# Patient Record
Sex: Male | Born: 1960
Health system: Southern US, Community
[De-identification: ages and names within clinical notes are randomized; demographics above are authoritative.]

## PROBLEM LIST (undated history)

## (undated) DIAGNOSIS — K644 Residual hemorrhoidal skin tags: Secondary | ICD-10-CM

## (undated) DIAGNOSIS — F4310 Post-traumatic stress disorder, unspecified: Secondary | ICD-10-CM

## (undated) DIAGNOSIS — K222 Esophageal obstruction: Secondary | ICD-10-CM

## (undated) DIAGNOSIS — G40209 Localization-related (focal) (partial) symptomatic epilepsy and epileptic syndromes with complex partial seizures, not intractable, without status epilepticus: Secondary | ICD-10-CM

## (undated) DIAGNOSIS — F419 Anxiety disorder, unspecified: Secondary | ICD-10-CM

## (undated) DIAGNOSIS — H269 Unspecified cataract: Secondary | ICD-10-CM

## (undated) DIAGNOSIS — G4730 Sleep apnea, unspecified: Secondary | ICD-10-CM

## (undated) DIAGNOSIS — K5080 Crohn's disease of both small and large intestine without complications: Secondary | ICD-10-CM

## (undated) DIAGNOSIS — H409 Unspecified glaucoma: Secondary | ICD-10-CM

## (undated) DIAGNOSIS — R112 Nausea with vomiting, unspecified: Secondary | ICD-10-CM

## (undated) DIAGNOSIS — A4902 Methicillin resistant Staphylococcus aureus infection, unspecified site: Secondary | ICD-10-CM

## (undated) DIAGNOSIS — Z87442 Personal history of urinary calculi: Secondary | ICD-10-CM

## (undated) DIAGNOSIS — Z9181 History of falling: Secondary | ICD-10-CM

## (undated) DIAGNOSIS — K219 Gastro-esophageal reflux disease without esophagitis: Secondary | ICD-10-CM

## (undated) DIAGNOSIS — N289 Disorder of kidney and ureter, unspecified: Secondary | ICD-10-CM

## (undated) DIAGNOSIS — I10 Essential (primary) hypertension: Secondary | ICD-10-CM

## (undated) DIAGNOSIS — G43909 Migraine, unspecified, not intractable, without status migrainosus: Secondary | ICD-10-CM

## (undated) DIAGNOSIS — M1990 Unspecified osteoarthritis, unspecified site: Secondary | ICD-10-CM

## (undated) DIAGNOSIS — E785 Hyperlipidemia, unspecified: Secondary | ICD-10-CM

## (undated) DIAGNOSIS — F32A Depression, unspecified: Secondary | ICD-10-CM

## (undated) DIAGNOSIS — Z9889 Other specified postprocedural states: Secondary | ICD-10-CM

## (undated) DIAGNOSIS — A4101 Sepsis due to Methicillin susceptible Staphylococcus aureus: Secondary | ICD-10-CM

## (undated) DIAGNOSIS — C4490 Unspecified malignant neoplasm of skin, unspecified: Secondary | ICD-10-CM

## (undated) DIAGNOSIS — J189 Pneumonia, unspecified organism: Secondary | ICD-10-CM

## (undated) DIAGNOSIS — R569 Unspecified convulsions: Secondary | ICD-10-CM

## (undated) DIAGNOSIS — F329 Major depressive disorder, single episode, unspecified: Secondary | ICD-10-CM

## (undated) DIAGNOSIS — D126 Benign neoplasm of colon, unspecified: Secondary | ICD-10-CM

## (undated) DIAGNOSIS — N281 Cyst of kidney, acquired: Secondary | ICD-10-CM

## (undated) DIAGNOSIS — K7460 Unspecified cirrhosis of liver: Secondary | ICD-10-CM

## (undated) DIAGNOSIS — C801 Malignant (primary) neoplasm, unspecified: Secondary | ICD-10-CM

## (undated) DIAGNOSIS — D649 Anemia, unspecified: Secondary | ICD-10-CM

## (undated) HISTORY — DX: Disorder of kidney and ureter, unspecified: N28.9

## (undated) HISTORY — DX: Hyperlipidemia, unspecified: E78.5

## (undated) HISTORY — DX: History of falling: Z91.81

## (undated) HISTORY — PX: COLONOSCOPY: SHX174

## (undated) HISTORY — DX: Pneumonia, unspecified organism: J18.9

## (undated) HISTORY — PX: RADIOFREQUENCY ABLATION LIVER TUMOR: SHX2293

## (undated) HISTORY — PX: SINUS SURGERY WITH INSTATRAK: SHX5215

## (undated) HISTORY — PX: CATARACT EXTRACTION: SUR2

## (undated) HISTORY — PX: EYE SURGERY: SHX253

## (undated) HISTORY — DX: Unspecified cataract: H26.9

## (undated) HISTORY — PX: SHOULDER SURGERY: SHX246

## (undated) HISTORY — DX: Localization-related (focal) (partial) symptomatic epilepsy and epileptic syndromes with complex partial seizures, not intractable, without status epilepticus: G40.209

## (undated) HISTORY — DX: Methicillin resistant Staphylococcus aureus infection, unspecified site: A49.02

## (undated) HISTORY — DX: Gastro-esophageal reflux disease without esophagitis: K21.9

## (undated) HISTORY — DX: Unspecified glaucoma: H40.9

## (undated) HISTORY — PX: SKIN CANCER EXCISION: SHX779

## (undated) HISTORY — DX: Post-traumatic stress disorder, unspecified: F43.10

## (undated) HISTORY — DX: Esophageal obstruction: K22.2

## (undated) HISTORY — PX: POLYPECTOMY: SHX149

## (undated) HISTORY — DX: Malignant (primary) neoplasm, unspecified: C80.1

## (undated) HISTORY — DX: Cyst of kidney, acquired: N28.1

## (undated) HISTORY — DX: Residual hemorrhoidal skin tags: K64.4

## (undated) HISTORY — PX: UPPER GASTROINTESTINAL ENDOSCOPY: SHX188

## (undated) HISTORY — DX: Unspecified malignant neoplasm of skin, unspecified: C44.90

## (undated) HISTORY — DX: Unspecified convulsions: R56.9

## (undated) HISTORY — DX: Benign neoplasm of colon, unspecified: D12.6

## (undated) HISTORY — DX: Crohn's disease of both small and large intestine without complications: K50.80

## (undated) HISTORY — DX: Sepsis due to methicillin susceptible Staphylococcus aureus: A41.01

## (undated) HISTORY — PX: VASECTOMY: SHX75

---

## 1997-06-24 DIAGNOSIS — K5080 Crohn's disease of both small and large intestine without complications: Secondary | ICD-10-CM

## 1997-06-24 HISTORY — DX: Crohn's disease of both small and large intestine without complications: K50.80

## 1997-08-02 ENCOUNTER — Encounter (INDEPENDENT_AMBULATORY_CARE_PROVIDER_SITE_OTHER): Payer: Self-pay | Admitting: Gastroenterology

## 1999-10-15 ENCOUNTER — Encounter: Payer: Self-pay | Admitting: Gastroenterology

## 1999-10-23 DIAGNOSIS — D126 Benign neoplasm of colon, unspecified: Secondary | ICD-10-CM

## 1999-10-23 HISTORY — DX: Benign neoplasm of colon, unspecified: D12.6

## 1999-10-25 ENCOUNTER — Encounter (INDEPENDENT_AMBULATORY_CARE_PROVIDER_SITE_OTHER): Payer: Self-pay | Admitting: Gastroenterology

## 1999-10-26 ENCOUNTER — Other Ambulatory Visit: Admission: RE | Admit: 1999-10-26 | Discharge: 1999-10-26 | Payer: Self-pay | Admitting: Gastroenterology

## 1999-11-01 ENCOUNTER — Encounter: Payer: Self-pay | Admitting: Gastroenterology

## 1999-11-01 ENCOUNTER — Ambulatory Visit (HOSPITAL_COMMUNITY): Admission: RE | Admit: 1999-11-01 | Discharge: 1999-11-01 | Payer: Self-pay | Admitting: Gastroenterology

## 1999-11-26 ENCOUNTER — Ambulatory Visit (HOSPITAL_COMMUNITY): Admission: RE | Admit: 1999-11-26 | Discharge: 1999-11-26 | Payer: Self-pay | Admitting: Gastroenterology

## 1999-11-26 ENCOUNTER — Encounter: Payer: Self-pay | Admitting: Gastroenterology

## 2000-08-19 ENCOUNTER — Other Ambulatory Visit: Admission: RE | Admit: 2000-08-19 | Discharge: 2000-08-19 | Payer: Self-pay | Admitting: Gastroenterology

## 2000-08-19 ENCOUNTER — Encounter (INDEPENDENT_AMBULATORY_CARE_PROVIDER_SITE_OTHER): Payer: Self-pay

## 2000-08-19 ENCOUNTER — Encounter (INDEPENDENT_AMBULATORY_CARE_PROVIDER_SITE_OTHER): Payer: Self-pay | Admitting: Gastroenterology

## 2000-11-07 ENCOUNTER — Encounter: Admission: RE | Admit: 2000-11-07 | Discharge: 2000-11-07 | Payer: Self-pay | Admitting: Gastroenterology

## 2000-11-07 ENCOUNTER — Encounter: Payer: Self-pay | Admitting: Gastroenterology

## 2000-12-02 ENCOUNTER — Encounter (INDEPENDENT_AMBULATORY_CARE_PROVIDER_SITE_OTHER): Payer: Self-pay | Admitting: Gastroenterology

## 2000-12-02 ENCOUNTER — Other Ambulatory Visit: Admission: RE | Admit: 2000-12-02 | Discharge: 2000-12-02 | Payer: Self-pay | Admitting: Gastroenterology

## 2000-12-02 ENCOUNTER — Encounter (INDEPENDENT_AMBULATORY_CARE_PROVIDER_SITE_OTHER): Payer: Self-pay | Admitting: Specialist

## 2001-03-01 ENCOUNTER — Encounter: Payer: Self-pay | Admitting: Orthopedic Surgery

## 2001-03-01 ENCOUNTER — Ambulatory Visit (HOSPITAL_COMMUNITY): Admission: RE | Admit: 2001-03-01 | Discharge: 2001-03-01 | Payer: Self-pay | Admitting: Orthopedic Surgery

## 2001-05-22 ENCOUNTER — Ambulatory Visit (HOSPITAL_COMMUNITY): Admission: RE | Admit: 2001-05-22 | Discharge: 2001-05-22 | Payer: Self-pay | Admitting: Gastroenterology

## 2001-05-22 ENCOUNTER — Encounter: Payer: Self-pay | Admitting: Gastroenterology

## 2001-06-30 ENCOUNTER — Encounter (INDEPENDENT_AMBULATORY_CARE_PROVIDER_SITE_OTHER): Payer: Self-pay | Admitting: Gastroenterology

## 2001-12-10 ENCOUNTER — Encounter: Payer: Self-pay | Admitting: Internal Medicine

## 2001-12-10 ENCOUNTER — Ambulatory Visit (HOSPITAL_COMMUNITY): Admission: RE | Admit: 2001-12-10 | Discharge: 2001-12-10 | Payer: Self-pay | Admitting: Internal Medicine

## 2002-01-26 ENCOUNTER — Encounter: Payer: Self-pay | Admitting: Internal Medicine

## 2002-01-26 ENCOUNTER — Encounter: Admission: RE | Admit: 2002-01-26 | Discharge: 2002-01-26 | Payer: Self-pay | Admitting: Internal Medicine

## 2002-10-18 ENCOUNTER — Ambulatory Visit (HOSPITAL_COMMUNITY): Admission: RE | Admit: 2002-10-18 | Discharge: 2002-10-18 | Payer: Self-pay | Admitting: Internal Medicine

## 2002-10-18 ENCOUNTER — Encounter: Payer: Self-pay | Admitting: Internal Medicine

## 2003-01-04 ENCOUNTER — Encounter (INDEPENDENT_AMBULATORY_CARE_PROVIDER_SITE_OTHER): Payer: Self-pay | Admitting: Gastroenterology

## 2003-05-23 ENCOUNTER — Encounter: Payer: Self-pay | Admitting: Gastroenterology

## 2004-02-07 ENCOUNTER — Encounter (INDEPENDENT_AMBULATORY_CARE_PROVIDER_SITE_OTHER): Payer: Self-pay | Admitting: Gastroenterology

## 2004-05-24 ENCOUNTER — Ambulatory Visit: Payer: Self-pay | Admitting: Gastroenterology

## 2004-06-28 ENCOUNTER — Ambulatory Visit: Payer: Self-pay | Admitting: Gastroenterology

## 2004-09-03 ENCOUNTER — Ambulatory Visit: Payer: Self-pay | Admitting: Internal Medicine

## 2004-09-12 ENCOUNTER — Ambulatory Visit: Payer: Self-pay

## 2004-10-30 ENCOUNTER — Ambulatory Visit: Payer: Self-pay | Admitting: Internal Medicine

## 2005-01-02 ENCOUNTER — Ambulatory Visit: Payer: Self-pay | Admitting: Gastroenterology

## 2005-01-03 ENCOUNTER — Ambulatory Visit: Payer: Self-pay | Admitting: Internal Medicine

## 2005-01-07 ENCOUNTER — Ambulatory Visit: Payer: Self-pay | Admitting: Gastroenterology

## 2005-01-08 ENCOUNTER — Ambulatory Visit (HOSPITAL_COMMUNITY): Admission: RE | Admit: 2005-01-08 | Discharge: 2005-01-08 | Payer: Self-pay | Admitting: Gastroenterology

## 2005-01-17 ENCOUNTER — Ambulatory Visit (HOSPITAL_COMMUNITY): Admission: RE | Admit: 2005-01-17 | Discharge: 2005-01-17 | Payer: Self-pay | Admitting: Urology

## 2005-03-15 ENCOUNTER — Ambulatory Visit: Payer: Self-pay | Admitting: Gastroenterology

## 2005-03-15 ENCOUNTER — Ambulatory Visit (HOSPITAL_COMMUNITY): Admission: RE | Admit: 2005-03-15 | Discharge: 2005-03-15 | Payer: Self-pay | Admitting: Gastroenterology

## 2006-01-23 ENCOUNTER — Ambulatory Visit: Payer: Self-pay | Admitting: Internal Medicine

## 2006-04-25 ENCOUNTER — Ambulatory Visit: Payer: Self-pay | Admitting: Internal Medicine

## 2006-05-22 ENCOUNTER — Ambulatory Visit: Payer: Self-pay | Admitting: Internal Medicine

## 2006-11-10 ENCOUNTER — Ambulatory Visit: Payer: Self-pay | Admitting: Gastroenterology

## 2006-11-13 ENCOUNTER — Ambulatory Visit (HOSPITAL_COMMUNITY): Admission: RE | Admit: 2006-11-13 | Discharge: 2006-11-13 | Payer: Self-pay | Admitting: Gastroenterology

## 2007-02-20 ENCOUNTER — Telehealth (INDEPENDENT_AMBULATORY_CARE_PROVIDER_SITE_OTHER): Payer: Self-pay | Admitting: *Deleted

## 2007-02-24 ENCOUNTER — Ambulatory Visit: Payer: Self-pay | Admitting: Internal Medicine

## 2007-02-27 ENCOUNTER — Ambulatory Visit: Payer: Self-pay | Admitting: Internal Medicine

## 2007-02-27 ENCOUNTER — Encounter (INDEPENDENT_AMBULATORY_CARE_PROVIDER_SITE_OTHER): Payer: Self-pay | Admitting: *Deleted

## 2007-02-27 DIAGNOSIS — I10 Essential (primary) hypertension: Secondary | ICD-10-CM | POA: Insufficient documentation

## 2007-02-27 DIAGNOSIS — E785 Hyperlipidemia, unspecified: Secondary | ICD-10-CM | POA: Insufficient documentation

## 2007-02-27 LAB — CONVERTED CEMR LAB
AST: 22 units/L (ref 0–37)
Basophils Relative: 0.4 % (ref 0.0–1.0)
Bilirubin, Direct: 0.2 mg/dL (ref 0.0–0.3)
CO2: 28 meq/L (ref 19–32)
Cholesterol, target level: 200 mg/dL
Eosinophils Absolute: 0.2 10*3/uL (ref 0.0–0.6)
Eosinophils Relative: 2.7 % (ref 0.0–5.0)
GFR calc Af Amer: 104 mL/min
GFR calc non Af Amer: 86 mL/min
Glucose, Bld: 102 mg/dL — ABNORMAL HIGH (ref 70–99)
HCT: 47.2 % (ref 39.0–52.0)
HDL goal, serum: 40 mg/dL
Hemoglobin: 16.3 g/dL (ref 13.0–17.0)
Lymphocytes Relative: 23.9 % (ref 12.0–46.0)
MCV: 89.9 fL (ref 78.0–100.0)
Neutro Abs: 4 10*3/uL (ref 1.4–7.7)
Neutrophils Relative %: 62.1 % (ref 43.0–77.0)
Sodium: 143 meq/L (ref 135–145)
Total Protein: 7.3 g/dL (ref 6.0–8.3)
WBC: 6.5 10*3/uL (ref 4.5–10.5)

## 2007-03-11 ENCOUNTER — Ambulatory Visit: Payer: Self-pay | Admitting: Gastroenterology

## 2007-03-11 LAB — CONVERTED CEMR LAB
ALT: 31 units/L (ref 0–53)
Albumin: 3.9 g/dL (ref 3.5–5.2)
Alkaline Phosphatase: 59 units/L (ref 39–117)
CO2: 27 meq/L (ref 19–32)
Calcium: 8.8 mg/dL (ref 8.4–10.5)
Eosinophils Absolute: 0.1 10*3/uL (ref 0.0–0.6)
Eosinophils Relative: 1.5 % (ref 0.0–5.0)
Glucose, Bld: 97 mg/dL (ref 70–99)
Lymphocytes Relative: 18.4 % (ref 12.0–46.0)
MCV: 89.5 fL (ref 78.0–100.0)
Monocytes Relative: 9.3 % (ref 3.0–11.0)
Neutro Abs: 5.6 10*3/uL (ref 1.4–7.7)
Platelets: 313 10*3/uL (ref 150–400)
Potassium: 3.9 meq/L (ref 3.5–5.1)
RBC: 5.36 M/uL (ref 4.22–5.81)
Sodium: 145 meq/L (ref 135–145)
Total Bilirubin: 1.2 mg/dL (ref 0.3–1.2)
Total Protein: 7.2 g/dL (ref 6.0–8.3)
WBC: 7.8 10*3/uL (ref 4.5–10.5)

## 2007-04-15 ENCOUNTER — Ambulatory Visit: Payer: Self-pay | Admitting: Gastroenterology

## 2007-04-15 ENCOUNTER — Encounter: Payer: Self-pay | Admitting: Gastroenterology

## 2007-04-15 ENCOUNTER — Encounter: Payer: Self-pay | Admitting: Internal Medicine

## 2007-04-17 ENCOUNTER — Encounter: Payer: Self-pay | Admitting: Internal Medicine

## 2007-04-21 ENCOUNTER — Telehealth (INDEPENDENT_AMBULATORY_CARE_PROVIDER_SITE_OTHER): Payer: Self-pay | Admitting: *Deleted

## 2007-06-02 ENCOUNTER — Encounter: Payer: Self-pay | Admitting: Internal Medicine

## 2007-06-08 ENCOUNTER — Ambulatory Visit: Payer: Self-pay | Admitting: Internal Medicine

## 2007-06-09 ENCOUNTER — Ambulatory Visit: Payer: Self-pay | Admitting: Internal Medicine

## 2007-06-12 ENCOUNTER — Encounter (INDEPENDENT_AMBULATORY_CARE_PROVIDER_SITE_OTHER): Payer: Self-pay | Admitting: *Deleted

## 2007-09-11 ENCOUNTER — Emergency Department (HOSPITAL_COMMUNITY): Admission: EM | Admit: 2007-09-11 | Discharge: 2007-09-11 | Payer: Self-pay | Admitting: Emergency Medicine

## 2007-09-11 DIAGNOSIS — K5080 Crohn's disease of both small and large intestine without complications: Secondary | ICD-10-CM | POA: Insufficient documentation

## 2007-09-11 DIAGNOSIS — K644 Residual hemorrhoidal skin tags: Secondary | ICD-10-CM | POA: Insufficient documentation

## 2007-09-11 DIAGNOSIS — M810 Age-related osteoporosis without current pathological fracture: Secondary | ICD-10-CM | POA: Insufficient documentation

## 2007-11-05 ENCOUNTER — Encounter: Payer: Self-pay | Admitting: Internal Medicine

## 2007-11-18 ENCOUNTER — Telehealth: Payer: Self-pay | Admitting: Gastroenterology

## 2007-12-07 DIAGNOSIS — K219 Gastro-esophageal reflux disease without esophagitis: Secondary | ICD-10-CM | POA: Insufficient documentation

## 2007-12-07 DIAGNOSIS — Z860100 Personal history of colon polyps, unspecified: Secondary | ICD-10-CM | POA: Insufficient documentation

## 2007-12-08 ENCOUNTER — Ambulatory Visit: Payer: Self-pay | Admitting: Gastroenterology

## 2007-12-08 LAB — CONVERTED CEMR LAB
ALT: 46 units/L (ref 0–53)
Alkaline Phosphatase: 58 units/L (ref 39–117)
Basophils Absolute: 0 10*3/uL (ref 0.0–0.1)
Basophils Relative: 0.6 % (ref 0.0–1.0)
CO2: 28 meq/L (ref 19–32)
Creatinine, Ser: 1.5 mg/dL (ref 0.4–1.5)
GFR calc Af Amer: 65 mL/min
GFR calc non Af Amer: 54 mL/min
HCT: 48.6 % (ref 39.0–52.0)
Hemoglobin: 16.6 g/dL (ref 13.0–17.0)
Lipase: 25 units/L (ref 11.0–59.0)
Lymphocytes Relative: 22.3 % (ref 12.0–46.0)
MCHC: 34.2 g/dL (ref 30.0–36.0)
MCV: 90.1 fL (ref 78.0–100.0)
Monocytes Absolute: 1 10*3/uL (ref 0.1–1.0)
Neutro Abs: 4.6 10*3/uL (ref 1.4–7.7)
RBC: 5.39 M/uL (ref 4.22–5.81)
RDW: 12.9 % (ref 11.5–14.6)
Sodium: 142 meq/L (ref 135–145)
Total Bilirubin: 1 mg/dL (ref 0.3–1.2)

## 2008-04-01 ENCOUNTER — Ambulatory Visit: Payer: Self-pay | Admitting: Internal Medicine

## 2008-04-01 ENCOUNTER — Encounter: Payer: Self-pay | Admitting: Internal Medicine

## 2008-04-11 ENCOUNTER — Encounter (INDEPENDENT_AMBULATORY_CARE_PROVIDER_SITE_OTHER): Payer: Self-pay | Admitting: *Deleted

## 2008-04-15 ENCOUNTER — Telehealth (INDEPENDENT_AMBULATORY_CARE_PROVIDER_SITE_OTHER): Payer: Self-pay | Admitting: *Deleted

## 2008-05-13 ENCOUNTER — Telehealth (INDEPENDENT_AMBULATORY_CARE_PROVIDER_SITE_OTHER): Payer: Self-pay | Admitting: *Deleted

## 2008-05-30 ENCOUNTER — Telehealth (INDEPENDENT_AMBULATORY_CARE_PROVIDER_SITE_OTHER): Payer: Self-pay | Admitting: *Deleted

## 2008-07-20 ENCOUNTER — Encounter: Payer: Self-pay | Admitting: Gastroenterology

## 2008-09-07 ENCOUNTER — Encounter: Payer: Self-pay | Admitting: Adult Health

## 2008-10-28 ENCOUNTER — Encounter: Payer: Self-pay | Admitting: Adult Health

## 2008-11-10 ENCOUNTER — Ambulatory Visit: Payer: Self-pay | Admitting: Pulmonary Disease

## 2008-11-10 DIAGNOSIS — R059 Cough, unspecified: Secondary | ICD-10-CM | POA: Insufficient documentation

## 2008-11-10 DIAGNOSIS — R05 Cough: Secondary | ICD-10-CM | POA: Insufficient documentation

## 2008-11-14 ENCOUNTER — Ambulatory Visit: Payer: Self-pay | Admitting: Cardiovascular Disease

## 2008-11-18 ENCOUNTER — Telehealth (INDEPENDENT_AMBULATORY_CARE_PROVIDER_SITE_OTHER): Payer: Self-pay | Admitting: *Deleted

## 2008-11-30 ENCOUNTER — Ambulatory Visit: Payer: Self-pay | Admitting: Pulmonary Disease

## 2008-12-21 ENCOUNTER — Telehealth: Payer: Self-pay | Admitting: Gastroenterology

## 2008-12-21 ENCOUNTER — Ambulatory Visit: Payer: Self-pay | Admitting: Pulmonary Disease

## 2009-01-09 ENCOUNTER — Ambulatory Visit: Payer: Self-pay | Admitting: Gastroenterology

## 2009-01-10 ENCOUNTER — Telehealth: Payer: Self-pay | Admitting: Gastroenterology

## 2009-01-10 ENCOUNTER — Ambulatory Visit: Payer: Self-pay | Admitting: Gastroenterology

## 2009-01-13 ENCOUNTER — Encounter (INDEPENDENT_AMBULATORY_CARE_PROVIDER_SITE_OTHER): Payer: Self-pay

## 2009-01-15 LAB — CONVERTED CEMR LAB
ALT: 76 units/L — ABNORMAL HIGH (ref 0–53)
Albumin: 3.7 g/dL (ref 3.5–5.2)
Alkaline Phosphatase: 69 units/L (ref 39–117)
Basophils Absolute: 0.1 10*3/uL (ref 0.0–0.1)
CO2: 26 meq/L (ref 19–32)
Eosinophils Absolute: 0.1 10*3/uL (ref 0.0–0.7)
HCT: 43.6 % (ref 39.0–52.0)
Lymphs Abs: 1.3 10*3/uL (ref 0.7–4.0)
MCV: 91.2 fL (ref 78.0–100.0)
Monocytes Absolute: 0.7 10*3/uL (ref 0.1–1.0)
Platelets: 270 10*3/uL (ref 150.0–400.0)
Potassium: 3.7 meq/L (ref 3.5–5.1)
RDW: 14.4 % (ref 11.5–14.6)
Sed Rate: 10 mm/hr (ref 0–22)
Sodium: 143 meq/L (ref 135–145)
Total Bilirubin: 1.1 mg/dL (ref 0.3–1.2)
Total Protein: 7.1 g/dL (ref 6.0–8.3)

## 2009-01-18 ENCOUNTER — Telehealth (INDEPENDENT_AMBULATORY_CARE_PROVIDER_SITE_OTHER): Payer: Self-pay | Admitting: *Deleted

## 2009-03-02 ENCOUNTER — Ambulatory Visit: Payer: Self-pay | Admitting: Pulmonary Disease

## 2009-03-10 ENCOUNTER — Telehealth: Payer: Self-pay | Admitting: Gastroenterology

## 2009-03-13 ENCOUNTER — Telehealth (INDEPENDENT_AMBULATORY_CARE_PROVIDER_SITE_OTHER): Payer: Self-pay | Admitting: *Deleted

## 2009-03-16 ENCOUNTER — Ambulatory Visit: Payer: Self-pay | Admitting: Gastroenterology

## 2009-03-27 ENCOUNTER — Ambulatory Visit: Payer: Self-pay | Admitting: Gastroenterology

## 2009-03-27 DIAGNOSIS — R740 Nonspecific elevation of levels of transaminase and lactic acid dehydrogenase [LDH]: Secondary | ICD-10-CM

## 2009-03-27 DIAGNOSIS — R1319 Other dysphagia: Secondary | ICD-10-CM | POA: Insufficient documentation

## 2009-03-29 ENCOUNTER — Telehealth: Payer: Self-pay | Admitting: Gastroenterology

## 2009-04-12 ENCOUNTER — Encounter: Payer: Self-pay | Admitting: Gastroenterology

## 2009-04-12 ENCOUNTER — Ambulatory Visit: Payer: Self-pay | Admitting: Gastroenterology

## 2009-04-14 ENCOUNTER — Encounter: Payer: Self-pay | Admitting: Gastroenterology

## 2009-05-22 LAB — CONVERTED CEMR LAB
ALT: 57 units/L — ABNORMAL HIGH (ref 0–53)
Albumin: 3.6 g/dL (ref 3.5–5.2)
Total Protein: 7 g/dL (ref 6.0–8.3)

## 2009-05-23 ENCOUNTER — Ambulatory Visit: Payer: Self-pay | Admitting: Gastroenterology

## 2009-06-01 ENCOUNTER — Encounter: Admission: RE | Admit: 2009-06-01 | Discharge: 2009-06-01 | Payer: Self-pay | Admitting: Family Medicine

## 2009-08-28 ENCOUNTER — Ambulatory Visit: Payer: Self-pay | Admitting: Gastroenterology

## 2009-08-28 LAB — CONVERTED CEMR LAB
Bilirubin, Direct: 0.1 mg/dL (ref 0.0–0.3)
Total Bilirubin: 1.1 mg/dL (ref 0.3–1.2)
Total Protein: 6.8 g/dL (ref 6.0–8.3)

## 2009-08-29 LAB — CONVERTED CEMR LAB
ALT: 50 units/L (ref 0–53)
Alkaline Phosphatase: 64 units/L (ref 39–117)
Bilirubin, Direct: 0.2 mg/dL (ref 0.0–0.3)
Total Protein: 6.7 g/dL (ref 6.0–8.3)

## 2009-09-04 ENCOUNTER — Encounter (INDEPENDENT_AMBULATORY_CARE_PROVIDER_SITE_OTHER): Payer: Self-pay | Admitting: *Deleted

## 2009-12-11 ENCOUNTER — Ambulatory Visit: Payer: Self-pay | Admitting: Gastroenterology

## 2009-12-11 ENCOUNTER — Encounter (INDEPENDENT_AMBULATORY_CARE_PROVIDER_SITE_OTHER): Payer: Self-pay | Admitting: *Deleted

## 2009-12-12 LAB — CONVERTED CEMR LAB
Amylase: 52 units/L (ref 27–131)
BUN: 14 mg/dL (ref 6–23)
Bilirubin, Direct: 0.1 mg/dL (ref 0.0–0.3)
CRP, High Sensitivity: 4.12 (ref 0.00–5.00)
Chloride: 106 meq/L (ref 96–112)
Creatinine, Ser: 1.2 mg/dL (ref 0.4–1.5)
Eosinophils Absolute: 0 10*3/uL (ref 0.0–0.7)
Eosinophils Relative: 0.4 % (ref 0.0–5.0)
Lipase: 26 units/L (ref 11.0–59.0)
MCHC: 34.3 g/dL (ref 30.0–36.0)
MCV: 95.1 fL (ref 78.0–100.0)
Monocytes Absolute: 1.1 10*3/uL — ABNORMAL HIGH (ref 0.1–1.0)
Neutrophils Relative %: 69.6 % (ref 43.0–77.0)
Platelets: 331 10*3/uL (ref 150.0–400.0)
Total Bilirubin: 1 mg/dL (ref 0.3–1.2)
WBC: 12.1 10*3/uL — ABNORMAL HIGH (ref 4.5–10.5)

## 2009-12-13 ENCOUNTER — Ambulatory Visit: Payer: Self-pay | Admitting: Gastroenterology

## 2009-12-14 LAB — CONVERTED CEMR LAB
Glucose, Bld: 58 mg/dL — ABNORMAL LOW (ref 70–99)
Hgb A1c MFr Bld: 6.6 % — ABNORMAL HIGH (ref 4.6–6.5)

## 2010-01-26 ENCOUNTER — Ambulatory Visit: Payer: Self-pay | Admitting: Gastroenterology

## 2010-01-31 ENCOUNTER — Encounter: Payer: Self-pay | Admitting: Gastroenterology

## 2010-02-15 ENCOUNTER — Encounter: Payer: Self-pay | Admitting: Gastroenterology

## 2010-03-01 ENCOUNTER — Encounter: Payer: Self-pay | Admitting: Gastroenterology

## 2010-03-27 ENCOUNTER — Encounter: Payer: Self-pay | Admitting: Gastroenterology

## 2010-06-05 ENCOUNTER — Encounter: Payer: Self-pay | Admitting: Gastroenterology

## 2010-06-24 HISTORY — PX: ELBOW SURGERY: SHX618

## 2010-07-12 ENCOUNTER — Emergency Department (HOSPITAL_COMMUNITY)
Admission: EM | Admit: 2010-07-12 | Discharge: 2010-07-12 | Payer: Self-pay | Source: Home / Self Care | Admitting: Family Medicine

## 2010-07-12 ENCOUNTER — Ambulatory Visit (HOSPITAL_COMMUNITY)
Admission: RE | Admit: 2010-07-12 | Discharge: 2010-07-12 | Payer: Self-pay | Source: Home / Self Care | Admitting: Emergency Medicine

## 2010-07-23 ENCOUNTER — Ambulatory Visit: Admission: RE | Admit: 2010-07-23 | Discharge: 2010-07-23 | Payer: Self-pay | Source: Home / Self Care

## 2010-07-23 DIAGNOSIS — R7309 Other abnormal glucose: Secondary | ICD-10-CM | POA: Insufficient documentation

## 2010-07-24 ENCOUNTER — Ambulatory Visit: Admit: 2010-07-24 | Payer: Self-pay | Admitting: Gastroenterology

## 2010-07-24 NOTE — Letter (Signed)
Summary: [facility]   Imported By: Phillis Knack 03/19/2010 10:20:49  _____________________________________________________________________  External Attachment:    Type:   Image     Comment:   External Document

## 2010-07-24 NOTE — Letter (Signed)
Summary: Patient Notice- Polyp Results  [facility]  [address] [place], [place] [address]   Phone: [phone]  Fax: [phone]        January 31, 2010 MRN: [id number]    TAMARION HAYMOND [place],   [address]    Dear Mr. PLOCH,  I am pleased to inform you that the colon polyp(s) removed during your recent colonoscopy was (were) found to be benign (no cancer detected) upon pathologic examination.  The colonic biopies were normal; colitis was not noted.  I recommend you have a repeat colonoscopy examination in 3 years to look for recurrent polyps, as having colon polyps increases your risk for having recurrent polyps or even colon cancer in the future.  Should you develop new or worsening symptoms of abdominal pain, bowel habit changes or bleeding from the rectum or bowels, please schedule an evaluation with either your primary care physician or with me.  Continue treatment plan as outlined the day of your exam.  Please call us if you are having persistent problems or have questions about your condition that have not been fully answered at this time.  Sincerely,  Ladene Artist MD [facility]  This letter has been electronically signed by your physician.  Appended Document: Patient Notice- Polyp Results letter mailed

## 2010-07-24 NOTE — Letter (Signed)
Summary: Office Visit Letter  [facility]  [address] [place], [place] [address]   Phone: [phone]  Fax: [phone]      September 04, 2009 MRN: [id number]   [name] [name], [place]  [address]   Dear [name],   According to our records, it is time for you to schedule a follow-up office visit with [place].   At your convenience, please call [phone] (option #2)to schedule an office visit. If you have any questions, concerns, or feel that this letter is in error, we would appreciate your call.   Sincerely,  Norberto Sorenson T. Fuller Plan, M.D.  [facility] [phone]

## 2010-07-24 NOTE — Letter (Signed)
Summary: [facility] Instructions  [facility]  [place], [place] [address]   Phone: [phone]  Fax: [phone]       [name] [name]    1960-12-19    MRN: [id number]        Procedure Day /Date: 01/26/2010 Friday     Arrival Time: 12:30pm     Procedure Time: 1:30pm     Location of Procedure:                    X   [place] (4th Floor)                        [place]   Starting 5 days prior to your procedure 01/21/2010 do not eat nuts, seeds, popcorn, corn, beans, peas,  salads, or any raw vegetables.  Do not take any fiber supplements (e.g. Metamucil, Citrucel, and Benefiber).  THE DAY BEFORE YOUR PROCEDURE         DATE: 01/25/2010  DAY: Thursday  1.  Drink clear liquids the entire day-NO SOLID FOOD  2.  Do not drink anything colored red or purple.  Avoid juices with pulp.  No orange juice.  3.  Drink at least 64 oz. (8 glasses) of fluid/clear liquids during the day to prevent dehydration and help the prep work efficiently.  CLEAR LIQUIDS INCLUDE: Water Jello Ice Popsicles Tea (sugar ok, no milk/cream) Powdered fruit flavored drinks Coffee (sugar ok, no milk/cream) Gatorade Juice: apple, white grape, white cranberry  Lemonade Clear bullion, consomm, broth Carbonated beverages (any kind) Strained chicken noodle soup Hard Candy                             4.  In the morning, mix first dose of MoviPrep solution:    Empty 1 Pouch A and 1 Pouch B into the disposable container    Add lukewarm drinking water to the top line of the container. Mix to dissolve    Refrigerate (mixed solution should be used within 24 hrs)  5.  Begin drinking the prep at 5:00 p.m. The MoviPrep container is divided by 4 marks.   Every 15 minutes drink the solution down to the next mark (approximately 8 oz) until the full liter is complete.   6.  Follow completed prep with 16 oz of clear liquid of your choice (Nothing  red or purple).  Continue to drink clear liquids until bedtime.  7.  Before going to bed, mix second dose of MoviPrep solution:    Empty 1 Pouch A and 1 Pouch B into the disposable container    Add lukewarm drinking water to the top line of the container. Mix to dissolve    Refrigerate  THE DAY OF YOUR PROCEDURE      DATE: 01/26/2010 DAY: Friday  Beginning at 8:30am (5 hours before procedure):         1. Every 15 minutes, drink the solution down to the next mark (approx 8 oz) until the full liter is complete.  2. Follow completed prep with 16 oz. of clear liquid of your choice.    3. You may drink clear liquids until 11:30am(2 HOURS BEFORE PROCEDURE).   MEDICATION INSTRUCTIONS  Unless otherwise instructed, you should take regular prescription medications with a small sip of water   as early as possible the morning of your procedure.  OTHER INSTRUCTIONS  You will need a responsible adult at least 49 years of age to accompany you and drive you home.   This person must remain in the waiting room during your procedure.  Wear loose fitting clothing that is easily removed.  Leave jewelry and other valuables at home.  However, you may wish to bring a book to read or  an iPod/MP3 player to listen to music as you wait for your procedure to start.  Remove all body piercing jewelry and leave at home.  Total time from sign-in until discharge is approximately 2-3 hours.  You should go home directly after your procedure and rest.  You can resume normal activities the  day after your procedure.  The day of your procedure you should not:   Drive   Make legal decisions   Operate machinery   Drink alcohol   Return to work  You will receive specific instructions about eating, activities and medications before you leave.    The above instructions have been reviewed and explained to me by   _______________________    I fully understand and can verbalize these  instructions _____________________________ Date _________

## 2010-07-24 NOTE — Letter (Signed)
Summary: [facility]   Imported By: Phillis Knack 04/04/2010 10:46:25  _____________________________________________________________________  External Attachment:    Type:   Image     Comment:   External Document

## 2010-07-24 NOTE — Procedures (Signed)
Summary: Colonoscopy  Patient: [name] [name] Note: All result statuses are Final unless otherwise noted.  Tests: (1) Colonoscopy (COL)   COL Colonoscopy           DONE (C)     [facility] [employer] [address]     [place], [place]  [address]           COLONOSCOPY PROCEDURE REPORT           PATIENT:  [name], [name]  MR#:  [id number]     BIRTHDATE:  [birth year], 49 yrs. old  GENDER:  male     ENDOSCOPIST:  Norberto Sorenson T. Fuller Plan, MD, [facility]           PROCEDURE DATE:  01/26/2010     PROCEDURE:  Colonoscopy with biopsy and snare polypectomy     ASA CLASS:  Class II     INDICATIONS:  1) surveillance and high-risk screening  2) Crohn's     disease  3) follow-up of polyp, adenomatous polyp, 10/1999.     MEDICATIONS:   Fentanyl 50 mcg IV, Versed 7 mg IV     DESCRIPTION OF PROCEDURE:   After the risks benefits and     alternatives of the procedure were thoroughly explained, informed     consent was obtained.  Digital rectal exam was performed and     revealed no abnormalities.   The LB [id number]-[id number] endoscope     was introduced through the anus and advanced to the terminal ileum     which was intubated for a short distance, without limitations.     The quality of the prep was good, using MoviPrep.  The instrument     was then slowly withdrawn as the colon was fully examined. Photo     of appendiceal orifice did not capture.     <<PROCEDUREIMAGES>>     FINDINGS:  A sessile polyp was found in the distal transverse     colon. It was 4 mm in size. The polyp was removed using cold     biopsy forceps.  A sessile polyp was found in the sigmoid colon.     It was 6 mm in size. Polyp was snared without cautery. Retrieval     was successful.  A normal appearing cecum, ileocecal valve, and     appendiceal orifice were identified. The ascending, hepatic     flexure, splenic flexure, descending colon, and rectum appeared     unremarkable. Random biopsies were obtained and sent to pathology.     The  terminal ileum appeared normal. Retroflexed views in the     rectum revealed internal hemorrhoids, small.  The time to cecum =     1.75  minutes. The scope was then withdrawn (time =  9.75  min)     from the patient and the procedure completed.           COMPLICATIONS:  None           ENDOSCOPIC IMPRESSION:     1) 4 mm sessile polyp in the distal transverse colon     2) 6 mm sessile polyp in the sigmoid colon     3) Normal terminal ileum     4) Internal hemorrhoids           RECOMMENDATIONS:     1) Await pathology results     2) Repeat Colonoscopy in 3 years pending pathology review  Pricilla Riffle. Fuller Plan, MD, [facility]           CC: [name], MD           n.     REVISED:  01/26/2010 02:24 PM     eSIGNED:   Norberto Sorenson T. Stark at 01/26/2010 02:24 PM           [name] [name], [id number]  Note: An exclamation mark (!) indicates a result that was not dispersed into the flowsheet. Document Creation Date: 01/26/2010 2:25 PM _______________________________________________________________________  (1) Order result status: Final Collection or observation date-time: 01/26/2010 13:47 Requested date-time:  Receipt date-time:  Reported date-time:  Referring Physician:   Ordering Physician: Lucio Edward [phone]) Specimen Source:  Source: Tawanna Cooler Order Number: [id number] Lab site:   Appended Document: Colonoscopy     Procedures Next Due Date:    Colonoscopy: 01/2013

## 2010-07-24 NOTE — Assessment & Plan Note (Signed)
Summary: f/u--ch.   History of Present Illness Visit Type: Follow-up Visit Primary GI MD: [name] Igo MD [facility] Primary Provider: [name], MD Requesting Provider: n/a Chief Complaint: Here for Crohns follow up. Patient complains of Friday he had a flare which lasted all weekend with abdominal pain and diarrhea.  History of Present Illness:   [name] for followup today, noting several days of right lower quadrant pain, diarrhea, and malaise. He feels that his symptoms are typical for a mild flare of his Crohns. He relates no fevers, chills, nausea or vomiting, or severe pain.   GI Review of Systems    Reports abdominal pain.     Location of  Abdominal pain: RLQ.    Denies acid reflux, belching, bloating, chest pain, dysphagia with liquids, dysphagia with solids, heartburn, loss of appetite, nausea, vomiting, vomiting blood, weight loss, and  weight gain.      Reports diarrhea.     Denies anal fissure, black tarry stools, change in bowel habit, constipation, diverticulosis, fecal incontinence, heme positive stool, hemorrhoids, irritable bowel syndrome, jaundice, light color stool, liver problems, rectal bleeding, and  rectal pain.   Current Medications (verified): 1)  Diltiazem Hcl Cr 240 Mg  Cp24 (Diltiazem Hcl) .Marland Kitchen.. 1 By Mouth Qd 2)  Nexium 40 Mg Cpdr (Esomeprazole Magnesium) .... Take 1 Capsule By Mouth Once A Day 3)  Asacol Hd 800 Mg Tbec (Mesalamine) .... 2 Tablets By Mouth Three Times A Day 4)  Folic Acid 1 Mg  Tabs (Folic Acid) .Marland Kitchen.. 1 By Mouth Qd 5)  Pamine Forte 5 Mg  Tabs (Methscopolamine Bromide) .Marland Kitchen.. 1 By Mouth Bid 6)  Mercaptopurine 50 Mg  Tabs (Mercaptopurine) .... Two Tablets By Mouth Once Daily 7)  Gnp Dairy-Relief 3000 Unit  Tabs (Lactase) .... 3 Tabs Tid 8)  Calcium-Vitamin D 500-125 Mg-Unit Tabs (Calcium-Vitamin D) .... One Tablet By Mouth Three Times A Day 9)  Diovan 160 Mg Tabs (Valsartan) .Marland Kitchen.. 1 By Mouth Once Daily 10)  Florastor 250 Mg Caps  (Saccharomyces Boulardii) .... One Capsule By Mouth Once Daily 11)  Entocort Ec 3 Mg Xr24h-Cap (Budesonide) .... 3 Capsules By Mouth Every Morning  Allergies (verified): 1)  ! * Sulfa Based Antibiotics 2)  ! Pcn  Past History:  Past Medical History: GERD (ICD-530.81) COLONIC POLYPS, ADENOMATOUS, HX OF (ICD-V12.72), 10/1999 OSTEOPOROSIS (ICD-733.00) EXTERNAL HEMORRHOIDS (ICD-455.3) CROHN'S DISEASE, LARGE AND SMALL INTESTINES (ICD-555.2), 1999 HYPERLIPIDEMIA NEC/NOS (ICD-272.4) HYPERTENSION, ESSENTIAL NOS (ICD-401.9) Pneumonia x 3 Esophageal strictures  Past Surgical History: Reviewed history from 03/27/2009 and no changes required. S/P Right shoulder surgery Sinus Surgery   Family History: Reviewed history from 12/08/2007 and no changes required. No FH of Colon Cancer: Family History of Colon Polyps:Father Family History of Heart Disease:Mother and Father   Social History: Occupation: Financial controller Patient has never smoked.  Alcohol Use - no Daily Caffeine Use 1 coke a day Illicit Drug Use - no Patient does not get regular exercise.   Review of Systems       The patient complains of allergy/sinus, arthritis/joint pain, back pain, blood in urine, fatigue, and muscle pains/cramps.         The pertinent positives and negatives are noted as above and in the HPI. All other ROS were reviewed and were negative.   Vital Signs:  Patient profile:   50 year old male Height:      71 inches Weight:      266.8 pounds BMI:     37.35  Pulse rate:   80 / minute Pulse rhythm:   regular BP sitting:   132 / 82  (left arm) Cuff size:   regular  Vitals Entered By: Bernita Buffy CMA Deborra Medina) (December 11, 2009 3:29 PM)  Physical Exam  General:  Well developed, well nourished, no acute distress. Head:  Normocephalic and atraumatic. Eyes:  PERRLA, no icterus. Ears:  Normal auditory acuity. Mouth:  No deformity or lesions, dentition normal. Neck:  Supple; no masses or  thyromegaly. Lungs:  Clear throughout to auscultation. Heart:  Regular rate and rhythm; no murmurs, rubs,  or bruits. Abdomen:  Soft and nondistended. No masses, hepatosplenomegaly or hernias noted. Normal bowel sounds. Mild right lower quadrant and periumbilical tenderness to deep palpation without rebound or guarding. Msk:  Symmetrical with no gross deformities. Normal posture. Pulses:  Normal pulses noted. Extremities:  No clubbing, cyanosis, edema or deformities noted. Neurologic:  Alert and  oriented x4;  grossly normal neurologically. Psych:  Alert and cooperative. Normal mood and affect.  Impression & Recommendations:  Problem # 1:  CROHN'S DISEASE, LARGE AND SMALL INTESTINES (ICD-555.2) He appears to be having a mild flare of Crohn's disease on 6 mercaptopurine, Entocort and Asacol. Blood work as below. Continue current medications. If his symptoms fail to resolve within the next few days, or if they worsen, he is advise to call for further evaluation which will likely include an abdominal/pelvic CT scan.  He is due for surveillance colonoscopy for Crohn's colitis and a personal history of colon polyps in a few months and would like to schedule the procedure at this time. The risks, benefits and alternatives to colonoscopy with possible biopsy and possible polypectomy were discussed with the patient and they consent to proceed. The procedure will be scheduled electively. Orders: TLB-BMP (Basic Metabolic Panel-BMET) (35465-KCLEXNT) TLB-CBC Platelet - w/Differential (85025-CBCD) TLB-Hepatic/Liver Function Pnl (80076-HEPATIC) TLB-TSH (Thyroid Stimulating Hormone) (84443-TSH) TLB-CRP-High Sensitivity (C-Reactive Protein) (86140-FCRP) TLB-Amylase (82150-AMYL) TLB-Lipase (83690-LIPASE) Colonoscopy (Colon)  Problem # 2:  COLONIC POLYPS, ADENOMATOUS, HX OF (ICD-V12.72) Colonoscopy followup as above.  Problem # 3:  GERD (ICD-530.81) GERD with a history of esophageal strictures. Continue  standard antireflux measures and Nexium 40 mg q.a.m.  Patient Instructions: 1)  Get your labs drawn today in the basement.  2)  Your prescriptions have been sent to your mail order.  3)  Colonoscopy brochure given.  4)  Copy sent to : [name] Haber, MD 5)  The medication list was reviewed and reconciled.  All changed / newly prescribed medications were explained.  A complete medication list was provided to the patient / caregiver.  Prescriptions: MOVIPREP 100 GM  SOLR (PEG-KCL-NACL-NASULF-NA ASC-C) As per prep instructions.  #1 x 0   Entered by:   Marlon Pel CMA ([place])   Authorized by:   Ladene Artist MD [facility]   Signed by:   Marlon Pel CMA ([place]) on 12/11/2009   Method used:   Electronically to        [facility] (retail)             ,          Ph: [phone]       Fax: [phone]   RxID:   [id number] MERCAPTOPURINE 50 MG  TABS (MERCAPTOPURINE) two tablets by mouth once daily  #180 x 1   Entered by:   Marlon Pel CMA ([place])   Authorized by:   Ladene Artist MD [facility]   Signed by:   Marlon Pel CMA ([place]) on  12/11/2009   Method used:   Electronically to        [facility] (retail)             ,          Ph: [phone]       Fax: [phone]   RxID:   [id number] PAMINE FORTE 5 MG  TABS (METHSCOPOLAMINE BROMIDE) 1 by mouth bid  #180 x 3   Entered by:   Marlon Pel CMA ([place])   Authorized by:   Ladene Artist MD [facility]   Signed by:   Marlon Pel CMA ([place]) on 12/11/2009   Method used:   Electronically to        [facility] (retail)             ,          Ph: [phone]       Fax: [phone]   RxID:   [id number] ENTOCORT EC 3 MG XR24H-CAP (BUDESONIDE) 3 capsules by mouth every morning  #270 x 1   Entered by:   Marlon Pel CMA ([place])   Authorized by:   Ladene Artist MD [facility]   Signed by:   Marlon Pel CMA ([place]) on 12/11/2009   Method used:   Electronically to        [facility] (retail)              ,          Ph: [phone]       Fax: [phone]   RxID:   [id number] ASACOL HD 800 MG TBEC (MESALAMINE) 2 tablets by mouth three times a day  #540 x 3   Entered by:   Marlon Pel CMA ([place])   Authorized by:   Ladene Artist MD [facility]   Signed by:   Marlon Pel CMA ([place]) on 12/11/2009   Method used:   Electronically to        [facility] (retail)             ,          Ph: [phone]       Fax: [phone]   RxID:   [id number] NEXIUM 40 MG CPDR (ESOMEPRAZOLE MAGNESIUM) Take 1 capsule by mouth once a day  #90 x 3   Entered by:   Marlon Pel CMA ([place])   Authorized by:   Ladene Artist MD [facility]   Signed by:   Marlon Pel CMA ([place]) on 12/11/2009   Method used:   Electronically to        [facility] (retail)             ,          Ph: [phone]       Fax: [phone]   RxID:   [id number]

## 2010-07-26 NOTE — Letter (Signed)
Summary: Office Visit Letter  [facility]  [address] [place], [place] [address]   Phone: [phone]  Fax: [phone]      June 05, 2010 MRN: [id number]   [place], [place]  [address]   Dear Mr. PILGER,   According to our records, it is time for you to schedule a follow-up office visit with [place].   At your convenience, please call [phone] (option #2)to schedule an office visit. If you have any questions, concerns, or feel that this letter is in error, we would appreciate your call.   Sincerely,   Norberto Sorenson T. Fuller Plan, M.D.  [facility] [phone]

## 2010-08-01 NOTE — Assessment & Plan Note (Signed)
Summary: [place] referral preDM, Crohns / JCS   Vital Signs:  Patient profile:   50 year old male Height:      71 inches Weight:      269.7 pounds BMI:     37.75  Vitals Entered By: Iver Nestle PHD (July 23, 2010 11:07 AM)  Primary Care Provider:  [name], MD   History of Present Illness: Assessment:  Spent 60 min w/ pt.  Mr. Brandenburg works in [place] at [employer].  He lives with his wife and 89-YO daughter and 23-YO disabled son.  Usual eating pattern includes 3 meals and no snacks daily.  Everyday foods/beverages include [facility] bar, banana, & water for bkfst, salad (let, egg, chs, chx or ham, raisins, sunflwr sds) w/ ranch for lunch, and  ~32 oz Coke per day.  Avoided foods include red meat.  Mr. Dismore was diagnosed w/ Crohn's disease at age 64.  Other current diagnoses include osteoporosis, chronic pneumonia (3-4 bouts per yr), pre-DM, hypertension, hyperlipidemia, OA of hips and spine.  Mr. Hernan has not had any surgery for Crohn's, but has had prednisone tx for almost 2 yrs, up to 50 mg/day.  No usual exercise b/c of back pain, and in fact, he is currently recovering from a broken rib from chiropractic tx he sought to help him w/ back pain.  Tx had been very helpful prior to the broken rib.  24-hr recall suggests intake of  ~2600 kcal: B (AM)- none; L (11:45 PM)- TK Tripps:  ~1/2 of chef salad (let, tom, car, cuc, egg, [place], 1/4 c chs)  w/ 4 tbsp ranch drsng, 1 small muffin, 48 oz Coke; D (PM)- 6 oz grilled chx, 2 c mashed potatoes, 1 c peas, water.  Yesterday was atypical in that he did not have bkfst.    Nutrition Diagnosis:  Physical inactivity (NB-2.1) related to back pain as evidenced by report of no regular exercise.  Inappropriate intake of types of carbohydrate (NI-53.3) related to soda as evidenced by usual consumption of 32 oz Coke daily.  Excessive fat intake (NI-51.2) related to salad dressing and cheese as evidenced by almost-daily salad containing cheese and 4-8  tbsp regular dressing.    Intervention:  See Patient Instructions.    Monitoring/Eval:  Dietary intake, body weight, and exercise at 4-wk F/U.    Allergies: 1)  ! * Sulfa Based Antibiotics 2)  ! Pcn   Complete Medication List: 1)  Diltiazem Hcl Cr 240 Mg Cp24 (Diltiazem hcl) .Marland Kitchen.. 1 by mouth qd 2)  Nexium 40 Mg Cpdr (Esomeprazole magnesium) .... Take 1 capsule by mouth once a day 3)  Asacol Hd 800 Mg Tbec (Mesalamine) .... 2 tablets by mouth three times a day 4)  Folic Acid 1 Mg Tabs (Folic acid) .Marland Kitchen.. 1 by mouth qd 5)  Pamine Forte 5 Mg Tabs (Methscopolamine bromide) .Marland Kitchen.. 1 by mouth bid 6)  Mercaptopurine 50 Mg Tabs (Mercaptopurine) .... Two tablets by mouth once daily 7)  Gnp Dairy-relief 3000 Unit Tabs (Lactase) .... 3 tabs tid 8)  Calcium-vitamin D 500-125 Mg-unit Tabs (Calcium-vitamin d) .... One tablet by mouth three times a day 9)  Diovan 160 Mg Tabs (Valsartan) .Marland Kitchen.. 1 by mouth once daily 10)  Florastor 250 Mg Caps (Saccharomyces boulardii) .... One capsule by mouth once daily 11)  Entocort Ec 3 Mg Xr24h-cap (Budesonide) .... 3 capsules by mouth every morning  Other Orders: Inital Assessment Each 69mn - [facility]ova((83419  Patient Instructions: 1)  Continue your 3  meals a day.   2)  Switch to diet soda or stop your regular soda intake.  WATER is your body's preferred source of fluid.   3)  Best way to stop this habit?  Try weaning yourself by first measuring your intake, and decreasing progressively.  Remember  :  MINDFULNESS & MODERATION.   4)  See [place].[facility] for info on DM and glycemic control.   5)  Obtain twice as many veg's as protein or carbohydrate foods for both lunch and dinner.   6)  Be mindful about how much fat you're getting with a meal:  Read labels.  A good daily target for you would be 40-50 grams of fat.  Use low-fat dressing.  Use less cheese.   7)  Breakfast:  Almost any fruit is better than bananas for your blood sugar.  A better breakfast is one with more  protein (and less sugar).  Ideas?  [place] sandwich; wholewheat bread or bagel, yogurt with added berries; leftovers from dinner i.e., soup; high-fiber (at least 5 g fiber per serving) cereal with soy milk; cooked oatmeal.   8)  Write down what you eat for breakfast (what, how much, and what time), and the time at which you FIRST notice hunger.   9)  Daily food record; bring to your follow-up appt in  ~1 mo.     Orders Added: 1)  Inital Assessment Each 42mn - [facility]

## 2010-08-17 ENCOUNTER — Other Ambulatory Visit: Payer: 59

## 2010-08-17 ENCOUNTER — Other Ambulatory Visit: Payer: Self-pay | Admitting: Gastroenterology

## 2010-08-17 ENCOUNTER — Encounter: Payer: Self-pay | Admitting: Gastroenterology

## 2010-08-17 ENCOUNTER — Ambulatory Visit (INDEPENDENT_AMBULATORY_CARE_PROVIDER_SITE_OTHER): Payer: 59 | Admitting: Gastroenterology

## 2010-08-17 DIAGNOSIS — K5080 Crohn's disease of both small and large intestine without complications: Secondary | ICD-10-CM

## 2010-08-17 LAB — CBC WITH DIFFERENTIAL/PLATELET
Basophils Relative: 0.2 % (ref 0.0–3.0)
Eosinophils Absolute: 0 10*3/uL (ref 0.0–0.7)
HCT: 43.1 % (ref 39.0–52.0)
Hemoglobin: 15.2 g/dL (ref 13.0–17.0)
Lymphocytes Relative: 7.8 % — ABNORMAL LOW (ref 12.0–46.0)
Lymphs Abs: 1 10*3/uL (ref 0.7–4.0)
MCHC: 35.4 g/dL (ref 30.0–36.0)
MCV: 98.7 fl (ref 78.0–100.0)
Neutro Abs: 11 10*3/uL — ABNORMAL HIGH (ref 1.4–7.7)
RBC: 4.36 Mil/uL (ref 4.22–5.81)

## 2010-08-17 LAB — BASIC METABOLIC PANEL
CO2: 25 mEq/L (ref 19–32)
Calcium: 9 mg/dL (ref 8.4–10.5)
Chloride: 106 mEq/L (ref 96–112)
Potassium: 3.3 mEq/L — ABNORMAL LOW (ref 3.5–5.1)
Sodium: 140 mEq/L (ref 135–145)

## 2010-08-17 LAB — HEPATIC FUNCTION PANEL
Albumin: 3.8 g/dL (ref 3.5–5.2)
Alkaline Phosphatase: 75 U/L (ref 39–117)
Total Protein: 6.5 g/dL (ref 6.0–8.3)

## 2010-08-20 ENCOUNTER — Ambulatory Visit (INDEPENDENT_AMBULATORY_CARE_PROVIDER_SITE_OTHER): Payer: 59 | Admitting: Family Medicine

## 2010-08-20 DIAGNOSIS — E785 Hyperlipidemia, unspecified: Secondary | ICD-10-CM

## 2010-08-20 DIAGNOSIS — I10 Essential (primary) hypertension: Secondary | ICD-10-CM

## 2010-08-20 DIAGNOSIS — E669 Obesity, unspecified: Secondary | ICD-10-CM

## 2010-08-20 NOTE — Progress Notes (Signed)
Medical Nutrition Therapy:  Appt start time: 1661 end time:  1630.  Assessment:  Primary concerns today: Weight management and hyperlipidemia, as well as osteoporosis and [name].  [name] [name] has cut out Coke entirely, and is now substituting diet lemonade.  (He suffered severe headaches for a week.)  24-hr recall suggests intake of <1500 kcal: B- none; L (12:30 PM)- skinless chx breast, 1 c lima beans, 1 c peas, diet lemonade; D (7 PM)- chef salad (ham, [place], chs, >1/4 c f-f ranch), water.  He has been snacking on apples or oranges.  On workdays, lunch is eaten at the cafeteria (salad or 6-inch sub).  Although his cracked ribs are feeling much better, he has been working extra hours with [facility]'s transition to the new EMR, so has not been exercising at all.  [name] [name] has tried to increase veg's, and he is eating breakfast consistently on weekdays.  Weight has not changed despite changes made, so increasing activity will be important to effecting weight loss.   Progress Towards Goal(s):  In progress.   Nutritional Diagnosis: Physical inactivity (NB-2.1) related to back pain as evidenced by report of no regular exercise.  Inappropriate intake of types of carbohydrate (NI-53.3) related to soda as evidenced by usual consumption of 32 oz Coke daily.  Excessive fat intake (NI-51.2) related to salad dressing and cheese as evidenced by almost-daily salad containing cheese and 4-8 tbsp regular dressing.     Intervention:  Nutrition education.    Monitoring/Evaluation:  Dietary intake in 4 week(s).

## 2010-08-20 NOTE — Patient Instructions (Addendum)
Choose fresh or frozen vegetables, which are higher in potassium and much lower in sodium than canned.  Suggestion:  Try stir-frying some leafy greens in olive oil and minced garlic.  The best vegetables for you to include in large quantities are the non-starchy veg's.  (Don't count dried beans as veg's b/c they are high in starch.  Other high-starch veg's include potatoes, corn, and peas.)  If you eat potatoes, include the skin whenever possible.   Reminder:  See [place] for info on DM and glycemic control.   Daily food record; bring to your follow-up appt in ~1 mo.   Physical activity goal:  Walk 30 minutes 4 X wk.  PLAN your walking for the week!  Record number of minutes you walk on the calendar.   Three-day food record during the week prior to your next appt.  Bring this to your appt.

## 2010-08-21 NOTE — Assessment & Plan Note (Addendum)
Summary: follo-up Crohn's Disease   History of Present Illness Visit Type: Follow-up Visit Primary GI MD: Joylene Igo MD [facility] Primary Provider: [name], MD Requesting Provider: n/a Chief Complaint: follow-up Crohn's needs refills of his meds  History of Present Illness:    [name] for followup today he states his gastrointestinal symptoms are all under excellent control on his current regimen. He states he recently had left rib fractures while undergoing chiropractic adjustments.   GI Review of Systems      Denies abdominal pain, acid reflux, belching, bloating, chest pain, dysphagia with liquids, dysphagia with solids, heartburn, loss of appetite, nausea, vomiting, vomiting blood, weight loss, and  weight gain.        Denies anal fissure, black tarry stools, change in bowel habit, constipation, diarrhea, diverticulosis, fecal incontinence, heme positive stool, hemorrhoids, irritable bowel syndrome, jaundice, light color stool, liver problems, rectal bleeding, and  rectal pain.   Current Medications (verified): 1)  Diltiazem Hcl Cr 240 Mg  Cp24 (Diltiazem Hcl) .Marland Kitchen.. 1 By Mouth Qd 2)  Nexium 40 Mg Cpdr (Esomeprazole Magnesium) .... Take 1 Capsule By Mouth Once A Day 3)  Asacol Hd 800 Mg Tbec (Mesalamine) .... 2 Tablets By Mouth Three Times A Day 4)  Folic Acid 1 Mg  Tabs (Folic Acid) .Marland Kitchen.. 1 By Mouth Qd 5)  Pamine Forte 5 Mg  Tabs (Methscopolamine Bromide) .Marland Kitchen.. 1 By Mouth Bid 6)  Mercaptopurine 50 Mg  Tabs (Mercaptopurine) .... Two Tablets By Mouth Once Daily 7)  Gnp Dairy-Relief 3000 Unit  Tabs (Lactase) .... 3 Tabs Tid 8)  Calcium-Vitamin D 500-125 Mg-Unit Tabs (Calcium-Vitamin D) .... One Tablet By Mouth Three Times A Day 9)  Diovan 160 Mg Tabs (Valsartan) .Marland Kitchen.. 1 By Mouth Once Daily 10)  Florastor 250 Mg Caps (Saccharomyces Boulardii) .... One Capsule By Mouth Once Daily 11)  Entocort Ec 3 Mg Xr24h-Cap (Budesonide) .... 3 Capsules By Mouth Every  Morning  Allergies (verified): 1)  ! * Sulfa Based Antibiotics 2)  ! Pcn 3)  ! * Delaudid  Past History:  Past Medical History: Reviewed history from 12/11/2009 and no changes required. GERD (ICD-530.81) COLONIC POLYPS, ADENOMATOUS, HX OF (ICD-V12.72), 10/1999 OSTEOPOROSIS (ICD-733.00) EXTERNAL HEMORRHOIDS (ICD-455.3) CROHN'S DISEASE, LARGE AND SMALL INTESTINES (ICD-555.2), 1999 HYPERLIPIDEMIA NEC/NOS (ICD-272.4) HYPERTENSION, ESSENTIAL NOS (ICD-401.9) Pneumonia x 3 Esophageal strictures  Past Surgical History: Reviewed history from 03/27/2009 and no changes required. S/P Right shoulder surgery Sinus Surgery   Family History: Reviewed history from 12/08/2007 and no changes required. No FH of Colon Cancer: Family History of Colon Polyps:Father Family History of Heart Disease:Mother and Father   Social History: Reviewed history from 12/11/2009 and no changes required. Occupation: Financial controller Patient has never smoked.  Alcohol Use - no Daily Caffeine Use 1 coke a day Illicit Drug Use - no Patient does not get regular exercise.   Vital Signs:  Patient profile:   50 year old male Height:      71 inches Weight:      274 pounds BMI:     38.35 Pulse rate:   84 / minute Pulse rhythm:   regular BP sitting:   122 / 78  (left arm)  Vitals Entered By: Randye Lobo NCMA (August 17, 2010 3:31 PM)  Physical Exam  General:  Well developed, well nourished, no acute distress. Head:  Normocephalic and atraumatic. Eyes:  PERRLA, no icterus. Mouth:  No deformity or lesions, dentition normal. Lungs:  Clear throughout to auscultation. Heart:  Regular rate and rhythm; no murmurs, rubs,  or bruits. Abdomen:  Soft, nontender and nondistended. No masses, hepatosplenomegaly or hernias noted. Normal bowel sounds. Neurologic:  Alert and  oriented x4;  grossly normal neurologically. Psych:  Alert and cooperative. Normal mood and affect.  Impression &  Recommendations:  Problem # 1:  CROHN'S DISEASE, LARGE AND SMALL INTESTINES (ICD-555.2)  Crohn's disease is stable on 6 mercaptopurine, Entocort and Asacol. Blood work as below. Continue current medications. Orders: TLB-BMP (Basic Metabolic Panel-BMET) (63893-TDSKAJG) TLB-CBC Platelet - w/Differential (85025-CBCD) TLB-Hepatic/Liver Function Pnl (80076-HEPATIC) TLB-TSH (Thyroid Stimulating Hormone) (84443-TSH) TLB-Lipase (83690-LIPASE)  Problem # 2:  GERD (ICD-530.81) GERD with a history of esophageal strictures. Continue standard antireflux measures and Nexium 40 mg q.a.m.  Problem # 3:  COLONIC POLYPS, ADENOMATOUS, HX OF (ICD-V12.72)  Surveillance colonoscopy August 2014.  Problem # 4:  OSTEOPOROSIS (ICD-733.00)  Further followup and treatment per primary physician.  Patient Instructions: 1)  Please go directly to the basement to have your labs drawn.  2)  Prescriptions have been sent to your pharmacy.  3)  Please continue current medications.  4)  Please schedule a follow-up appointment in 6 months. 5)  Copy sent to :  [name], MD 6)  The medication list was reviewed and reconciled.  All changed / newly prescribed medications were explained.  A complete medication list was provided to the patient / caregiver.  Prescriptions: ENTOCORT EC 3 MG XR24H-CAP (BUDESONIDE) 3 capsules by mouth every morning  #90 x 5   Entered by:   Marlon Pel CMA ([place])   Authorized by:   Ladene Artist MD [facility]   Signed by:   Marlon Pel CMA Deborra Medina) on 08/17/2010   Method used:   Electronically to        [facility]. [phone]* (retail)       [address]       [place], [place]  [address]       Ph: [phone]       Fax: [phone]   RxID:   [id number] MERCAPTOPURINE 50 MG  TABS (MERCAPTOPURINE) two tablets by mouth once daily  #60 x 5   Entered by:   Marlon Pel CMA ([place])   Authorized by:   Ladene Artist MD [facility]   Signed by:    Marlon Pel CMA Deborra Medina) on 08/17/2010   Method used:   Electronically to        [facility]. [phone]* (retail)       [address]       [place], [place]  [address]       Ph: [phone]       Fax: [phone]   RxID:   [id number] ASACOL HD 800 MG TBEC (MESALAMINE) 2 tablets by mouth three times a day  #180 x 5   Entered by:   Marlon Pel CMA ([place])   Authorized by:   Ladene Artist MD [facility]   Signed by:   Marlon Pel CMA Deborra Medina) on 08/17/2010   Method used:   Electronically to        [facility]. [phone]* (retail)       [address]       [place], [place]  [address]       Ph: [phone]       Fax: [phone]  RxID:   [id number] NEXIUM 40 MG CPDR (ESOMEPRAZOLE MAGNESIUM) Take 1 capsule by mouth once a day  #30 x 11   Entered by:   Marlon Pel CMA ([place])   Authorized by:   Ladene Artist MD [facility]   Signed by:   Marlon Pel CMA Deborra Medina) on 08/17/2010   Method used:   Electronically to        [facility]. [phone]* (retail)       [address]       [place], [place]  [address]       Ph: [phone]       Fax: [phone]   RxID:   [id number] FOLIC ACID 1 MG  TABS (FOLIC ACID) 1 by mouth qd  #30 x 5   Entered by:   Marlon Pel CMA ([place])   Authorized by:   Ladene Artist MD [facility]   Signed by:   Marlon Pel CMA Deborra Medina) on 08/17/2010   Method used:   Electronically to        [facility]. [phone]* (retail)       [address].       [place], [place]  [address]       Ph: [phone]       Fax: [phone]   RxID:   [id number] FLORASTOR 250 MG CAPS (SACCHAROMYCES BOULARDII) one capsule by mouth once daily  #30 x 11   Entered by:   Marlon Pel CMA ([place])   Authorized by:   Ladene Artist MD [facility]   Signed by:   Marlon Pel CMA ([place]) on 08/17/2010   Method used:   Print then  Give to Patient   RxID:   [id number]   Appended Document: follo-up Crohn's Disease    Clinical Lists Changes  Medications: Rx of NEXIUM 40 MG CPDR (ESOMEPRAZOLE MAGNESIUM) Take 1 capsule by mouth once a day;  #90 x 3;  Signed;  Entered by: Marlon Pel CMA (AAMA);  Authorized by: Ladene Artist MD [facility];  Method used: Electronically to [facility]*, , ,   , Ph: [phone], Fax: [phone] Rx of ASACOL HD 800 MG TBEC (MESALAMINE) 2 tablets by mouth three times a day;  #540 x 3;  Signed;  Entered by: Marlon Pel CMA (AAMA);  Authorized by: Ladene Artist MD [facility];  Method used: Electronically to [facility]*, , ,   , Ph: [phone], Fax: [phone] Rx of FOLIC ACID 1 MG  TABS (FOLIC ACID) 1 by mouth qd;  #90 x 3;  Signed;  Entered by: Marlon Pel CMA (AAMA);  Authorized by: Ladene Artist MD [facility];  Method used: Electronically to [facility]*, , ,   , Ph: [phone], Fax: [phone] Rx of MERCAPTOPURINE 50 MG  TABS (MERCAPTOPURINE) two tablets by mouth once daily;  #180 x 1;  Signed;  Entered by: Marlon Pel CMA (AAMA);  Authorized by: Ladene Artist MD [facility];  Method used: Electronically to [facility]*, , ,   , Ph: [phone], Fax: [phone] Rx of ENTOCORT EC 3 MG XR24H-CAP (BUDESONIDE) 3 capsules by mouth every morning;  #270 x 3;  Signed;  Entered by: Marlon Pel CMA (AAMA);  Authorized by: Ladene Artist MD [facility];  Method used: Electronically to [facility]*, , ,   , Ph: [phone], Fax: [phone]    Prescriptions: ENTOCORT EC 3 MG  XR24H-CAP (BUDESONIDE) 3 capsules by mouth every morning  #270 x 3   Entered by:   Marlon Pel CMA ([place])   Authorized by:   Ladene Artist MD [facility]   Signed by:   Marlon Pel CMA ([place]) on 08/20/2010   Method used:   Electronically to        [facility] (retail)             ,          Ph: [phone]       Fax: [phone]   RxID:   [id number] MERCAPTOPURINE 50 MG  TABS  (MERCAPTOPURINE) two tablets by mouth once daily  #180 x 1   Entered by:   Marlon Pel CMA ([place])   Authorized by:   Ladene Artist MD [facility]   Signed by:   Marlon Pel CMA ([place]) on 08/20/2010   Method used:   Electronically to        [facility] (retail)             ,          Ph: [phone]       Fax: [phone]   RxID:   [id number] FOLIC ACID 1 MG  TABS (FOLIC ACID) 1 by mouth qd  #90 x 3   Entered by:   Marlon Pel CMA ([place])   Authorized by:   Ladene Artist MD [facility]   Signed by:   Marlon Pel CMA ([place]) on 08/20/2010   Method used:   Electronically to        [facility] (retail)             ,          Ph: [phone]       Fax: [phone]   RxID:   [id number] ASACOL HD 800 MG TBEC (MESALAMINE) 2 tablets by mouth three times a day  #540 x 3   Entered by:   Marlon Pel CMA ([place])   Authorized by:   Ladene Artist MD [facility]   Signed by:   Marlon Pel CMA ([place]) on 08/20/2010   Method used:   Electronically to        [facility] (retail)             ,          Ph: [phone]       Fax: [phone]   RxID:   [id number] NEXIUM 40 MG CPDR (ESOMEPRAZOLE MAGNESIUM) Take 1 capsule by mouth once a day  #90 x 3   Entered by:   Marlon Pel CMA ([place])   Authorized by:   Ladene Artist MD [facility]   Signed by:   Marlon Pel CMA ([place]) on 08/20/2010   Method used:   Electronically to        [facility]* (retail)             ,          Ph: [phone]       Fax: [phone]   RxID:   [id number]

## 2010-08-24 ENCOUNTER — Encounter (INDEPENDENT_AMBULATORY_CARE_PROVIDER_SITE_OTHER): Payer: Self-pay | Admitting: *Deleted

## 2010-08-24 ENCOUNTER — Other Ambulatory Visit: Payer: 59

## 2010-08-24 ENCOUNTER — Other Ambulatory Visit: Payer: Self-pay | Admitting: Gastroenterology

## 2010-08-24 DIAGNOSIS — K5080 Crohn's disease of both small and large intestine without complications: Secondary | ICD-10-CM

## 2010-08-24 LAB — BASIC METABOLIC PANEL
BUN: 13 mg/dL (ref 6–23)
Calcium: 9 mg/dL (ref 8.4–10.5)
GFR: 96.68 mL/min (ref >60.00)
Glucose, Bld: 152 mg/dL — ABNORMAL HIGH (ref 70–99)
Sodium: 142 mEq/L (ref 135–145)

## 2010-09-17 ENCOUNTER — Ambulatory Visit: Payer: 59 | Admitting: Family Medicine

## 2010-09-24 ENCOUNTER — Ambulatory Visit: Payer: 59 | Admitting: Family Medicine

## 2010-09-26 ENCOUNTER — Other Ambulatory Visit: Payer: Self-pay | Admitting: Family Medicine

## 2010-10-16 ENCOUNTER — Ambulatory Visit: Payer: 59 | Admitting: Family Medicine

## 2010-10-21 ENCOUNTER — Observation Stay (HOSPITAL_COMMUNITY)
Admission: EM | Admit: 2010-10-21 | Discharge: 2010-10-23 | Disposition: A | Discharge status: Home / Self Care | Payer: 59 | Source: Ambulatory Visit | Attending: Family Medicine | Admitting: Family Medicine

## 2010-10-21 ENCOUNTER — Emergency Department (HOSPITAL_COMMUNITY): Payer: 59

## 2010-10-21 DIAGNOSIS — R0989 Other specified symptoms and signs involving the circulatory and respiratory systems: Secondary | ICD-10-CM | POA: Insufficient documentation

## 2010-10-21 DIAGNOSIS — I059 Rheumatic mitral valve disease, unspecified: Secondary | ICD-10-CM | POA: Insufficient documentation

## 2010-10-21 DIAGNOSIS — R197 Diarrhea, unspecified: Secondary | ICD-10-CM | POA: Insufficient documentation

## 2010-10-21 DIAGNOSIS — R0602 Shortness of breath: Secondary | ICD-10-CM | POA: Insufficient documentation

## 2010-10-21 DIAGNOSIS — I079 Rheumatic tricuspid valve disease, unspecified: Secondary | ICD-10-CM | POA: Insufficient documentation

## 2010-10-21 DIAGNOSIS — I10 Essential (primary) hypertension: Secondary | ICD-10-CM | POA: Insufficient documentation

## 2010-10-21 DIAGNOSIS — R0609 Other forms of dyspnea: Secondary | ICD-10-CM | POA: Insufficient documentation

## 2010-10-21 DIAGNOSIS — R112 Nausea with vomiting, unspecified: Secondary | ICD-10-CM | POA: Insufficient documentation

## 2010-10-21 DIAGNOSIS — R509 Fever, unspecified: Secondary | ICD-10-CM | POA: Insufficient documentation

## 2010-10-21 DIAGNOSIS — R0789 Other chest pain: Principal | ICD-10-CM | POA: Insufficient documentation

## 2010-10-21 HISTORY — DX: Essential (primary) hypertension: I10

## 2010-10-21 LAB — POCT I-STAT, CHEM 8
Calcium, Ion: 1.01 mmol/L — ABNORMAL LOW (ref 1.12–1.32)
HCT: 45 % (ref 39.0–52.0)
TCO2: 23 mmol/L (ref 0–100)

## 2010-10-21 LAB — DIFFERENTIAL
Basophils Relative: 0 % (ref 0–1)
Eosinophils Absolute: 0.1 10*3/uL (ref 0.0–0.7)
Neutrophils Relative %: 88 % — ABNORMAL HIGH (ref 43–77)

## 2010-10-21 LAB — CBC
Platelets: 216 10*3/uL (ref 150–400)
RBC: 4.6 MIL/uL (ref 4.22–5.81)
WBC: 8.1 10*3/uL (ref 4.0–10.5)

## 2010-10-21 LAB — POCT CARDIAC MARKERS
CKMB, poc: <1 ng/mL — ABNORMAL LOW (ref 1.0–8.0)
Troponin i, poc: <0.05 ng/mL (ref 0.00–0.09)

## 2010-10-22 ENCOUNTER — Encounter (HOSPITAL_COMMUNITY): Payer: Self-pay | Admitting: Radiology

## 2010-10-22 ENCOUNTER — Emergency Department (HOSPITAL_COMMUNITY): Payer: 59

## 2010-10-22 DIAGNOSIS — R509 Fever, unspecified: Secondary | ICD-10-CM

## 2010-10-22 DIAGNOSIS — R112 Nausea with vomiting, unspecified: Secondary | ICD-10-CM

## 2010-10-22 DIAGNOSIS — R0789 Other chest pain: Secondary | ICD-10-CM

## 2010-10-22 LAB — POCT CARDIAC MARKERS: Troponin i, poc: <0.05 ng/mL (ref 0.00–0.09)

## 2010-10-22 LAB — CK TOTAL AND CKMB (NOT AT ARMC)
CK, MB: 1.2 ng/mL (ref 0.3–4.0)
Total CK: 69 U/L (ref 7–232)

## 2010-10-22 LAB — CARDIAC PANEL(CRET KIN+CKTOT+MB+TROPI)
CK, MB: 1.3 ng/mL (ref 0.3–4.0)
Relative Index: INVALID (ref 0.0–2.5)
Total CK: 72 U/L (ref 7–232)
Troponin I: 0.03 ng/mL (ref 0.00–0.06)
Troponin I: 0.01 ng/mL (ref 0.00–0.06)

## 2010-10-22 LAB — GLUCOSE, CAPILLARY: Glucose-Capillary: 71 mg/dL (ref 70–99)

## 2010-10-22 MED ORDER — IOHEXOL 300 MG/ML  SOLN: 100.0000 mL | Freq: Once | INTRAMUSCULAR | Status: AC | PRN

## 2010-10-23 ENCOUNTER — Inpatient Hospital Stay (HOSPITAL_COMMUNITY): Payer: 59

## 2010-10-23 DIAGNOSIS — A4902 Methicillin resistant Staphylococcus aureus infection, unspecified site: Secondary | ICD-10-CM

## 2010-10-23 HISTORY — DX: Methicillin resistant Staphylococcus aureus infection, unspecified site: A49.02

## 2010-10-23 LAB — LIPID PANEL
Cholesterol: 144 mg/dL (ref 0–200)
LDL Cholesterol: 76 mg/dL (ref 0–99)

## 2010-10-23 LAB — COMPREHENSIVE METABOLIC PANEL
Albumin: 3 g/dL — ABNORMAL LOW (ref 3.5–5.2)
Alkaline Phosphatase: 64 U/L (ref 39–117)
BUN: 6 mg/dL (ref 6–23)
Calcium: 8.3 mg/dL — ABNORMAL LOW (ref 8.4–10.5)
Potassium: 3.1 mEq/L — ABNORMAL LOW (ref 3.5–5.1)
Total Protein: 6.2 g/dL (ref 6.0–8.3)

## 2010-10-23 LAB — CBC
HCT: 43.9 % (ref 39.0–52.0)
Hemoglobin: 14.8 g/dL (ref 13.0–17.0)
MCV: 98.4 fL (ref 78.0–100.0)
RBC: 4.46 MIL/uL (ref 4.22–5.81)
RDW: 14.1 % (ref 11.5–15.5)
WBC: 4.3 10*3/uL (ref 4.0–10.5)

## 2010-10-23 LAB — GLUCOSE, CAPILLARY
Glucose-Capillary: 86 mg/dL (ref 70–99)
Glucose-Capillary: 99 mg/dL (ref 70–99)

## 2010-10-23 LAB — HEMOGLOBIN A1C: Mean Plasma Glucose: 163 mg/dL — ABNORMAL HIGH (ref <117)

## 2010-10-26 ENCOUNTER — Ambulatory Visit
Admission: RE | Admit: 2010-10-26 | Discharge: 2010-10-26 | Disposition: A | Discharge status: Home / Self Care | Payer: 59 | Source: Ambulatory Visit | Attending: Family Medicine | Admitting: Family Medicine

## 2010-10-26 ENCOUNTER — Other Ambulatory Visit: Payer: Self-pay | Admitting: Family Medicine

## 2010-10-26 DIAGNOSIS — K5090 Crohn's disease, unspecified, without complications: Secondary | ICD-10-CM

## 2010-10-26 DIAGNOSIS — R1011 Right upper quadrant pain: Secondary | ICD-10-CM

## 2010-10-30 NOTE — Discharge Summary (Signed)
[name] [name], [name] [name] NO.:  [id number]  MEDICAL RECORD NO.:  [id number]           PATIENT TYPE:  O  LOCATION:  2024                         FACILITY:  [facility]  PHYSICIAN:  Dickie La, MD        DATE OF BIRTH:  [birth year]  DATE OF ADMISSION:  10/21/2010 DATE OF DISCHARGE:  10/23/2010                              History and Physical   ATTENDING:  Dr. Mallie Mussel.  PRIMARY CARE PROVIDER:  [facility].  REASON FOR HOSPITALIZATION:  Acute coronary syndrome, rule out.  DISCHARGE DIAGNOSES: 1. Gastroesophageal reflux disease. 2. Gastroenteritis.  DISCHARGE MEDICATIONS:  New medications; 1. Diovan 80 mg p.o. daily 2. Nitroglycerin 0.4 mg sublingual p.r.n. chest pain. 3. Sumatriptan 50 mg p.o. p.r.n. migraines.  Continue home medications; 1. Asacol 200 mg p.o. t.i.d. 2. Calcium carbonate over-the-counter 1 tablet p.o. t.i.d. 3. Diltiazem 360 mg 1 capsule p.o. daily. 4. Dairy Relief 3 tablets p.o. t.i.d. 5. Entocort 3 mg 3 capsules p.o. daily. 6. Florastor 1 tablet p.o. daily. 7. Folic acid 1 mg p.o. daily. 8. Forteo 600 mcg subcutaneously nightly. 9. Mercaptopurine 100 mg p.o. daily. 10.Metformin 500 mg p.o. daily. 11.Methscopolamine Bromide 1 tablet p.o. b.i.d. 12.Nexium 40 mg p.o. daily. 13.Potassium over-the-counter 1 tablet p.o. daily.  Discontinued medications Diovan 320 p.o. daily.  PROCEDURES:  Echo on May 1 showed an ejection fraction of 55-60% with no wall motion abnormalities.  Mild thickening of the aortic valve leaflets and trivial mitral valve regurgitation as well as tricuspid valve regurgitation.  Chest x-ray on May 1 showed better lung volumes and interval improved ventilation.  No pneumonia or acute cardiopulmonary abnormality.  LABORATORY DATA:  Point-of-care cardiac enzymes negative x2.  Cardiac enzymes negative x2.  D-dimer 0.54, slightly elevated.  Blood lipase 21. TSH 0.352.  Fasting lipid panel; cholesterol 144,  triglyceride 161, HDL 36, LDL 76.  BRIEF HOSPITAL COURSE:  This is a 50 year old male with history of hypertension and Crohn disease presenting with a history of fever at home to 103, nausea, vomiting, diarrhea, atypical chest pain. 1. Nausea, vomiting, diarrhea, likely secondary to gastroenteritis. The patient remained afebrile on admission and during his hospitalization.      The patient was made n.p.o. on admission and his diet was advanced     as tolerated.  The patient also received IV fluids.  These symptoms     improved during the patient's hospitalization.  2. Atypical chest pain, likely secondary to GERD.  The patient     presented with substernal pressure and questionable     reproducibility.The pain was not worsened with activity but with lying flat.  There was concern for acute coronary syndrome, but this was ruled out with negative cardiac enzymes and unchanged EKGs x2.  No events on telemetry during his hospitalization. The patient's D-dimer was slightly elevated but there was no evidence of a pulmonary embolus on CT.  The CT however did show atelectasis versus pneumonia, but the latter was thought to be unlikely secondary to absence of fevers and normal respiratory exam.  As a result, the patient was not given  antibiotics during his hospitalization.  A repeat chest x-ray on the day of discharge showed improved lung volumes and no evidence of pneumonia. The patient was on oxygen via nasal cannula briefly for low oxygen saturations while sleeping when the patient desatted to the 60s. However when awake, the patient was satting in the mid 90s. Based on this and the patient being overweight with thick neck musculature, history of snoring, and not feeling rested in the morning, it is thought that the patient has an obstructive sleep apnea component.  The patient has never been diagnosed with this.  Also, based on the patient's history of Crohn disease, there was initially concern  for esophageal disease but this was thought unlikely.  The patient was recently seen by his GI physician whom he sees annually.  The patient was found to be stable at that time.  An esophagram from May 2008 was normal.  At the time of discharge, the patient's chest pain had significantly improved with proton pump inhibitors and hydration.   3. Crohn disease.  Stable.  No signs of GI bleeding.  Medications were     discontinued during his hospitalization but the patient was asked     to resume his medications upon discharge.  DISCHARGE INSTRUCTIONS:  The patient was asked to advance his diet as tolerated.  Low-sodium heart-healthy diet.  The patient has a followup appointment with his primary care provider, Dr. Verline Lema at [facility] on Thursday may May 3.  FOLLOWUP ISSUES: 1. PCP may consider sleep study for the patient to evaluate for     obstructive sleep apnea. 2. PCP may consider stress test, although the patient has no     history of cardiac problems, his parents have a history of stroke     and heart attack.  The patient was discharged with a prescription     for nitroglycerin and was     directed on how to take this before discharge and advised to return to the ED if the chest pain improved with the nitroglycerin.  The patient was discharged home in stable medical condition.    ______________________________ Karen Kays, MD   ______________________________ Dickie La, MD    AO/MEDQ  D:  10/23/2010  T:  10/24/2010  Job:  [id number]  cc:   [facility]  Electronically Signed by Karen Kays MD on 10/27/2010 01:41:36 PM Electronically Signed by Dorcas Mcmurray MD on 10/30/2010 09:51:56 AM

## 2010-11-06 NOTE — Assessment & Plan Note (Signed)
[facility] OFFICE NOTE   NAME:[name] [name], [name] [name]                        MRN:          [id number]  DATE:03/11/2007                            DOB:          12/14/1960    [name] [name] is a 50 year old white male, previously followed by Dr. Lyla Son, with a history of Crohn's disease maintained on 6-  mercaptopurine since about 2000.  He was diagnosed in 1999 and had  symptoms typical for Crohn's dating back to age 69. He was seen in  consultation by Dr. Regino Schultze at [facility] and his notes are included  on the chart from February 17, 2001 and February 25, 2001 and I have  reveiwed them both.  IBD serology panel in April of 2000 showed antibody  findings consistent with Crohn's disease, a celiac serology panel in  March of 1999 was negative, 6-mercaptopurine metabolite levels were  within acceptable range in March of 2005.  His last colonoscopy was in  August of 2005, and it was normal except for external hemorrhoids.  He  had a small capsule endoscopy in September of 2006 which was normal.  Terminal ileal biopsies in August of 2005 were negative.  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  Duodenal biopsies in June 2002 showed mild, chronic duodenitis.  No findings of Crohn's disease. Right colon biopsy in February of 2002  was normal.  He had a normal barium esophagram in May of 2008.  He  underwent an upper GI series and small bowel follow through in July of  2006 which showed mild-to-moderate fold thickening in the 2nd portion of  the duodenum and distal bulb, and a 5-mm lower pole left renal calculus.  No other abnormalities were noted.  CT scan in May of 2002 showed  thickening of the cecum.  CT scan in November of 2002 was suspicious for  low level thickening of the transverse and descending colon.  He is  currently asymptomatic.  He has no change in bowel  habits, abdominal  pain, nausea, vomiting, weight loss, melena, hematochezia, diarrhea,  fevers or chills.  He does note he occasionally has an increase in  diarrhea when under stress, but recently this has not been bothersome.  He notes increased joint pain in his ankles over the past several weeks  as well.   PAST MEDICAL HISTORY:  Crohn's disease  Osteoporosis  Adenomatous colon polyps- 10/1999  External hemorrhoids   CURRENT MEDICATIONS:  Listed on the chart, updated and reviewed.   MEDICATION ALLERGIES:  PENICILLIN and SULFA drugs.   PHYSICAL EXAMINATION:  No acute distress.  Weight 237 pounds, blood pressure is 120/90, pulse 88 and regular.  HEENT EXAM:  Anicteric sclerae, oropharynx is clear.  CHEST:  Clear to auscultation bilaterally.  CARDIAC:  Regular rate and rhythm without murmurs appreciated.  ABDOMEN:  Soft, nontender, nondistended.  Normoactive bowel sounds.  No  palpable organomegaly, masses, or hernias.  RECTAL EXAM:  Deferred to time of colonoscopy.   ASSESSMENT AND PLAN:  Crohn's ileocolitis with symptoms  consistent with  inflammatory bowel disease dating to age 22.  His disease has been  stable for the past several years.  History of adenomatous colon polyps.  He was recommended by Dr. Velora Heckler to undergo colonoscopy at 3 years from  his last exam.  He would like to proceed.  Risks, benefits, and  alternatives to colonoscopy and possible biopsy and possible polypectomy  discussed with the patient and he consents to proceed.  This will be  scheduled electively.  Continue current dose of 6-mercaptopurine.  Obtain a CMET and CBC, lipase and TSH.  Plan for followup office visits  every 6 months.     Pricilla Riffle. Fuller Plan, MD, [facility]  Electronically Signed    MTS/MedQ  DD: 03/13/2007  DT: 03/14/2007  Job #: [id number]   cc:   Darrick Penna. Linna Darner, MD,FACP,FCCP

## 2010-11-06 NOTE — Assessment & Plan Note (Signed)
[place] OFFICE NOTE   NAME:[name] [name]                        MRN:          [id number]  DATE:11/10/2006                            DOB:          10-Mar-1961    [name] [name] comes in and says he is doing well.  I have not seen him for  several years.  He has known Crohn's disease, GERD with some dysphagia  and history of Gilbert's disease and he has also been experiencing  chronic sinus headaches.  [name] [name] has always had an associated irritable  bowel-type syndrome as well and there has been some concern how much is  Crohn's disease or colitis versus IBS component.   MEDICATIONS:  1. Nexium.  2. Asacol 12 a day.  3. 6-MP 50 mg a day.  4. Folic acid.  5. Thiamine.  6. Tiazac.  7. Flagyl 500 mg daily.  8. Fish oil.  9. Calcium.   We did a capsule endoscopy on him in 2006 and it really did not show any  disease process in the small bowel.  Today, [name] [name] is more mainly  concerned about his dysphagia; it bothers him with coughing a lot and  wakes up in the morning with sour brash.  Sometimes he vomits, some  hoarseness and indigestion.  I told him to look up on Goggle about  active manifestations of GERD.  Also, when it comes to his inflammatory  bowel disease, I told him we ought to cut out his Flagyl and see how he  does without it now.  He has not had the problems with diarrhea and is  on __________.   PHYSICAL EXAMINATION:  His weight is 238, blood pressure 120/70, pulse  64 and regular.  NECK:  Unremarkable.  HEART:  Unremarkable.  EXTREMITIES:  Unremarkable.  RECTAL:  Deferred.   IMPRESSION:  As above.   RECOMMENDATION:  He is to continue the same medicines with Nexium, 6-MP,  Asacol and I gave him some Align.  I scheduled him for a barium swallow  at [facility] with a tablet and I told him that we would endoscope  and dilate him depending upon the results of the study.   In reviewing his chart,  it is concerning that he is still on the same  level of medication for suspected inflammatory bowel disease or Crohn's  colitis and also it is amazing that he seems to have done well on these  medicines.  I did a IBD First Step study on him in the past and this  showed over 94% of the patients with the markers that we detected to  have inflammatory bowel disease and 99% inflammatory bowel disease  patients with this test result combination to have Crohn's disease.  Biopsies have not been necessarily confirmatory, so I think that it  would be wise to reassess this situation again may be some followup  study of his antibodies.  I hate to see him continue on this much  medication without having more definitive evidence for this disease  process, unless it is just keeping it quiescent, as  he has been quiet  symptomatic, but certainly this could be irritable bowel syndrome and  maybe with the new __________ study for that, it would be helpful for  that as well.  We will get some blood tests on him.  He will be  following up with my successor after my retirement sometime in the next  few months, but in the meantime, I am going to get his barium swallow  back and possibly do an upper endoscopy with dilation and then the  results.     Clarene Reamer, MD  Electronically Signed    SML/MedQ  DD: 11/10/2006  DT: 11/11/2006  Job #: [id number]

## 2010-11-17 ENCOUNTER — Inpatient Hospital Stay (HOSPITAL_COMMUNITY)
Admission: AD | Admit: 2010-11-17 | Discharge: 2010-11-21 | DRG: 501 | Disposition: A | Discharge status: Home / Self Care | Payer: 59 | Source: Ambulatory Visit | Attending: Family Medicine | Admitting: Family Medicine

## 2010-11-17 DIAGNOSIS — M810 Age-related osteoporosis without current pathological fracture: Secondary | ICD-10-CM | POA: Diagnosis present

## 2010-11-17 DIAGNOSIS — M7020 Olecranon bursitis, unspecified elbow: Principal | ICD-10-CM | POA: Diagnosis present

## 2010-11-17 DIAGNOSIS — A4902 Methicillin resistant Staphylococcus aureus infection, unspecified site: Secondary | ICD-10-CM | POA: Diagnosis present

## 2010-11-17 DIAGNOSIS — E876 Hypokalemia: Secondary | ICD-10-CM | POA: Diagnosis present

## 2010-11-17 DIAGNOSIS — K5090 Crohn's disease, unspecified, without complications: Secondary | ICD-10-CM

## 2010-11-17 DIAGNOSIS — IMO0002 Reserved for concepts with insufficient information to code with codable children: Secondary | ICD-10-CM

## 2010-11-17 DIAGNOSIS — I10 Essential (primary) hypertension: Secondary | ICD-10-CM

## 2010-11-17 LAB — CBC
HCT: 41.7 % (ref 39.0–52.0)
Hemoglobin: 14.3 g/dL (ref 13.0–17.0)
MCV: 97.2 fL (ref 78.0–100.0)
WBC: 13.9 10*3/uL — ABNORMAL HIGH (ref 4.0–10.5)

## 2010-11-17 LAB — BASIC METABOLIC PANEL
CO2: 25 mEq/L (ref 19–32)
Chloride: 103 mEq/L (ref 96–112)
Glucose, Bld: 182 mg/dL — ABNORMAL HIGH (ref 70–99)
Potassium: 2.7 mEq/L — CL (ref 3.5–5.1)
Sodium: 140 mEq/L (ref 135–145)

## 2010-11-18 LAB — CBC
HCT: 38.4 % — ABNORMAL LOW (ref 39.0–52.0)
Hemoglobin: 12.5 g/dL — ABNORMAL LOW (ref 13.0–17.0)
MCH: 32 pg (ref 26.0–34.0)
MCHC: 32.6 g/dL (ref 30.0–36.0)
MCV: 98.2 fL (ref 78.0–100.0)

## 2010-11-18 LAB — BASIC METABOLIC PANEL
BUN: 8 mg/dL (ref 6–23)
CO2: 26 mEq/L (ref 19–32)
Calcium: 8 mg/dL — ABNORMAL LOW (ref 8.4–10.5)
Glucose, Bld: 101 mg/dL — ABNORMAL HIGH (ref 70–99)
Sodium: 141 mEq/L (ref 135–145)

## 2010-11-19 LAB — BASIC METABOLIC PANEL
CO2: 24 mEq/L (ref 19–32)
Calcium: 8.9 mg/dL (ref 8.4–10.5)
GFR calc Af Amer: >60 mL/min (ref >60)
GFR calc non Af Amer: >60 mL/min (ref >60)
Sodium: 137 mEq/L (ref 135–145)

## 2010-11-19 LAB — CBC
Hemoglobin: 14.4 g/dL (ref 13.0–17.0)
MCHC: 34.1 g/dL (ref 30.0–36.0)
RDW: 14.2 % (ref 11.5–15.5)
WBC: 12.3 10*3/uL — ABNORMAL HIGH (ref 4.0–10.5)

## 2010-11-19 LAB — SURGICAL PCR SCREEN: MRSA, PCR: NEGATIVE

## 2010-11-20 LAB — VANCOMYCIN, TROUGH: Vancomycin Tr: 14.1 ug/mL (ref 10.0–20.0)

## 2010-11-20 LAB — BASIC METABOLIC PANEL
GFR calc non Af Amer: >60 mL/min (ref >60)
Potassium: 5 mEq/L (ref 3.5–5.1)
Sodium: 141 mEq/L (ref 135–145)

## 2010-11-20 LAB — CBC
Platelets: 204 10*3/uL (ref 150–400)
RDW: 14.1 % (ref 11.5–15.5)
WBC: 8.7 10*3/uL (ref 4.0–10.5)

## 2010-11-21 LAB — BASIC METABOLIC PANEL
BUN: 6 mg/dL (ref 6–23)
Calcium: 9.6 mg/dL (ref 8.4–10.5)
Chloride: 104 mEq/L (ref 96–112)
Creatinine, Ser: 0.7 mg/dL (ref 0.4–1.5)
GFR calc Af Amer: >60 mL/min (ref >60)
GFR calc non Af Amer: >60 mL/min (ref >60)

## 2010-11-21 LAB — CULTURE, ROUTINE-ABSCESS

## 2010-11-21 LAB — CBC
Hemoglobin: 14.2 g/dL (ref 13.0–17.0)
RBC: 4.31 MIL/uL (ref 4.22–5.81)

## 2010-11-22 NOTE — H&P (Signed)
[name] [name], [name] [name]                 ACCOUNT NO.:  [id number]  MEDICAL RECORD NO.:  [id number]           PATIENT TYPE:  E  LOCATION:  [facility]                         FACILITY:  [facility]  PHYSICIAN:  Talbert Cage, M.D.DATE OF BIRTH:  [birth year]  DATE OF ADMISSION:  10/21/2010 DATE OF DISCHARGE:                             HISTORY & PHYSICAL   PRIMARY CARE PHYSICIAN:  Dr. Verline Lema at [facility]  CHIEF COMPLAINT:  Fever, nausea, vomiting, diarrhea, and chest pain.  HISTORY OF PRESENT ILLNESS:  This is a 50 year old male with a history of Crohn disease, hypertension, and diabetes who presents with fever, nausea, vomiting, diarrhea and chest pain.  Chest pain started today around 2 o'clock p.m. while the patient was taking nap.  The pain woke him from his sleep.  He describes it as an elephant sitting on his chest.  The patient felt like he could not take a deep breath.  The pain was constant, dull, and aching, is not relieved or aggravated by anything.  It does not radiate.  Currently, the pain is 6/10 and is not reproducible on palpation.  The patient also complained of nausea, vomiting, and diarrhea that started about the same time.  The patient vomited about 5 times, noticed no blood in his vomit.  He had about 5 loose stools today that were black in color.  The patient states that family has been experiencing this similar symptoms of fever, nausea, vomiting, diarrhea in the last few days.  The patient's temperature at home was a 103 per the patient.  According to the patient, he was mostly concerned about this new chest pain, so he called EMS.  EMERGENCY DEPARTMENT COURSE:  The patient started on the nitro drip with no relief.  He received 2 liters of IV fluids and his tachycardia improved.  EKG showed sinus tachy, but there was no previous EKG for comparison.  We were called to admit for ACS rule out and dehydration.  PAST MEDICAL HISTORY: 1. Crohn disease. 2.  Hypertension. 3. Diabetes. 4. Obesity.  ALLERGIES:  PENICILLIN, SULFA, and DILAUDID.  MEDICATIONS: 1. Florastor 1 tablet p.o. daily. 2. Calcium carbonate by mouth daily. 3. Dairy Relief 3 times a day. 4. Tiazac 240 mg p.o. daily. 5. Mercaptopurine 50 mg daily. 6. Diovan 160 mg daily. 7. Thiamine 5 mg p.o. b.i.d. 8. Entocort 3 mg p.o. daily. 9. Folic acid. 10.Asacol 400 mg 2 tablets p.o. t.i.d. 11.Nexium 40 mg p.o. daily  PAST SURGICAL HISTORY:  Shoulder arthroscopy and sinus surgery.  SOCIAL HISTORY:  Lives alone with wife and two kids.  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  He denies any smoking, alcohol, or illicit drug use.  FAMILY HISTORY:  Mother deceased of a heart attack at 58 years old. Father had CVA.  Siblings noncontributory.  REVIEW OF SYSTEMS:  Endorses fevers, chills, sweats, appetite, headache, chest pain, dyspnea, nausea, vomiting, diarrhea, and abdominal pain. Denies any sore throat, rhinorrhea.  Denies any bright red blood per rectum.  Denies any rashes.PHYSICAL EXAMINATION:  VITAL SIGNS:  Temperature 99.1, T-max 100.3, pulse 104-117, respirations 18, blood pressure 116-119/73-80,  pulse ox 94% on 2 liters. GENERAL:  Pleasant, in no acute distress. HEENT: NCAT.  PERRLA.  EOMI.  Oropharynx was dry without any exudates or erythema. NECK:  Supple, nontender.  No lymphadenopathy. CARDIOVASCULAR:  Tachycardic, sinus rhythm.  No murmurs, rubs or gallops. LUNGS:  Clear to auscultation bilaterally.  No wheezes, rales, or rhonchi. ABDOMEN:  Soft, nondistended with active bowel sounds.  Mild tenderness on palpation of the left lower quadrant. EXTREMITIES:  Pitting edema 1 to 2+ in bilateral lower extremities. NEURO:  AAO x3. MSK:  Pulses strong and equal in all extremities.  LABORATORY DATA AND STUDIES:  Point-of-care cardiac enzymes negative x2. CBC, white count 8.1, hemoglobin 15.4, hematocrit 45.4, platelets 216. I-STAT, sodium 143, potassium 2.8,  chloride 108, BUN 17, creatinine 1.0, glucose 163.  Chest x-ray showed low-volume chest without gross acute cardiopulmonary disease.  EKG, sinus tach, rate 113, probable LAD.  Q- waves in II, III, aVF.  No ST elevation or inverted T-waves.  ASSESSMENT AND PLAN:  This is a 50 year old male with known history of Crohn disease, hypertension, and diabetes who presents with chest pain, nausea, vomiting, diarrhea, and fever. 1. Chest pain, atypical.  We will admit to tele bed and rule out acute     coronary syndrome.  We will repeat EKG in the morning and risk     stratify with A1c, SLP, and TSH.  We will cycle cardiac enzymes x2.     Point-of-care cardiac enzymes were negative x2 in the ED.  Chest x-     ray showed no active cardiopulmonary disease.  We will recheck a D-     dimer to rule out pulmonary embolism given his chest pain, dyspnea,     and tachycardia.  We will monitor closely.  We will give aspirin 81     and morphine IV p.r.n. for pain. 2. Gastrointestinal.  The fever, nausea, vomiting, diarrhea are likely     secondary to gastroenteritis.  He does admit to having sick     contacts at home.  We will give Zofran and Phenergan as needed for     nausea, vomiting.  We will give a gastrointestinal cocktail x1 dose     now.  We will continue to hydrate vitamins and IV fluids.  The     patient has a history of Crohn disease, and he says that his     abdominal pain is chronic.  We will restart his home medications     for the Crohn disease.  We will continue Protonix 40 IV daily.  We     will replete his potassium, repeat a CMET in the morning.  We will     also check a lipase. 3. Hypertension.  Blood pressure is currently stable.  We will restart     his home antihypertensive in the morning when he is able to     tolerate p.o. 4. Questionable diabetes type 2.  The patient cannot remember the dose     of metformin, so we will hold for now in the setting of nausea,     vomiting,  diarrhea.  We will check CBG q.a.c. and at bedtime, may     need to start sliding scale insulin if CBG are elevated. 5. Prophylaxis.  Heparin subcu. 6. Full code. 7. Fluids, electrolytes, nutrition, gastrointestinal.  Clear liquids     and advance diet as tolerated.  Normal saline running at 150 mL an     hour with 20 mEq  KCl. 8. Disposition.  Pending clinical improvement.    ______________________________ Donnamarie Rossetti, MD   ______________________________ Talbert Cage, M.D.    ID/MEDQ  D:  10/22/2010  T:  10/22/2010  Job:  [id number]  Electronically Signed by Donnamarie Rossetti MD on 10/24/2010 08:36:00 PM Electronically Signed by Talbert Cage M.D. on 11/22/2010 09:37:41 AM

## 2010-11-24 NOTE — Op Note (Signed)
NAMELORETO, LOESCHER NO.:  [id number]  MEDICAL RECORD NO.:  [id number]           PATIENT TYPE:  O  LOCATION:  2024                         FACILITY:  [facility]  PHYSICIAN:  Lind Guest. [name], M.D.DATE OF BIRTH:  1960-10-06  DATE OF PROCEDURE:  11/19/2010 DATE OF DISCHARGE:  10/23/2010                              OPERATIVE REPORT   PREOPERATIVE DIAGNOSIS:  Left elbow infected olecranon bursa in the left arm cellulitis.  POSTOPERATIVE DIAGNOSIS:  Left elbow infected olecranon bursa in the left arm cellulitis.  PROCEDURE:  Irrigation and debridement of left elbow infected olecranon bursa with bursectomy.  FINDINGS:  Infected olecranon bursa, left elbow.  SURGEON:  Lind Guest. [name], MD  ANESTHESIA:  General.  BLOOD LOSS:  Less than 50 mL.  COMPLICATIONS:  None.  INDICATIONS:  Mr. Cassarino is a 50 year old gentleman who developed pain and redness at the tip of his left elbow several days ago.  He went to [facility] and was started on doxycycline.  Within the next 24 hours, the redness has worsened and he was admitted on Nov 17, 2010, to the [facility].  He was started on IV antibiotics and after 24 hours, his white count did decrease to 10,000, but his pain and redness were worsening.  When I saw him yesterday on the floor, he was placed a needle into the Olecranon bursa and was able to aspirate just a scant amount of fluid, but it did appear to be infectious.  Overnight, the cultures did show gram-positive cocci.  His white blood cell count went up to 12,000 this morning and with worsening pain and redness I talked with him and we planned to proceed with Olecranon bursectomy with irrigation and debridement.  He understands the risks and benefits of this and does wish to proceed with the surgery.  PROCEDURE DESCRIPTION:  After informed consent was obtained, appropriate left elbow was marked.  He was brought to the operating  room and placed supine on the operating table, general anesthesia was then obtained. His left arm was on the arm table.  After general anesthesia was obtained, the left arm was prepped and draped with DuraPrep and sterile drapes.  A time-out was called to identify the correct patient and correct left elbow.  I then made an incision directly over the Olecranon bursa and did found some gross purulence within the bursa and surrounding the tissues.  I debrided the bursa sharply with knife and basically performed a bursectomy and removed the necrotic tissue.  I probed further and did not find any other areas of fluctuance or abscess.  I then copiously irrigated the wound with 3 liters of normal saline solution using pulsatile lavage followed by 500 mL of bacitracin solution.  I then loosely reapproximated the skin with interrupted 2-0 nylon suture.  Xeroform followed by well- padded sterile dressing was applied and the patient was awakened, extubated, and taken to the recovery room in stable condition. Postoperatively, we will continue on IV antibiotics while the cultures are observed and we will see if this clinical exam and white blood cell count improve  tomorrow.     Lind Guest. [name], M.D.     CYB/MEDQ  D:  11/19/2010  T:  11/19/2010  Job:  [id number]  Electronically Signed by Jean Rosenthal M.D. on 11/24/2010 09:41:28 AM

## 2010-11-28 NOTE — Discharge Summary (Signed)
[name] [name], [name] [name]                 ACCOUNT NO.:  [id number]  MEDICAL RECORD NO.:  [id number]           PATIENT TYPE:  LOCATION:                                 FACILITY:  PHYSICIAN:  [name] [name] [name] [name], M.D.DATE OF BIRTH:  [birth year]  DATE OF ADMISSION: DATE OF DISCHARGE:                              DISCHARGE SUMMARY   PRIMARY CARE PROVIDER:  Dr. Verline [name] at [facility].  REASON FOR HOSPITALIZATION:  Right elbow cellulitis/bursitis.  DISCHARGE DIAGNOSES: 1. Right elbow methicillin-resistant Staphylococcus aureus infectious     bursitis. 2. History of Crohn disease. 3. History of hypertension. 4. History of osteoporosis. 5. History of hypokalemia. 6. History of migraines.  MEDICATION:  New medications: 1. Doxycycline 100 mg p.o. daily x14 days. 2. Oxycodone 5-10 mg p.o. q.4 h. p.r.n. pain.  Continue home medications: 1. Mesalamine 800 mg p.o. t.i.d. 2. Calcium carbonate 1 tablet p.o. t.i.d. 3. Diltiazem 360 mg p.o. daily. 4. Diovan 320 mg p.o. daily. 5. Dairy Relief over the counter. 6. Budesonide 3 capsules p.o. daily. 7. Florastor 1 tablet p.o. at bedtime. 8. Folic acid 1 mg p.o. daily. 9. Forteo 600 mcg injected subcutaneously at bedtime. 10.Mercaptopurine 100 mg p.o. daily. 11.Methscopolamine 5 mg p.o. b.i.d. 12.Nexium 40 mg p.o. daily. 13.Nitroglycerin sublingual 0.4 mg sublingually q.5 minutes x3 p.r.n.     chest pain. 14.Potassium chloride 20 mEq p.o. daily. 15.Relpax p.o. daily p.r.n. migraines.  CONSULTS:  None.  PROCEDURES:  Irrigation and debridement of right elbow bursa on Nov 19, 2010.  PERTINENT LABORATORY DATA:  White blood count on admission 13.9, on the day of discharge 11.2.  Potassium throughout hospitalization within normal limits.  Bursa fluid culture positive for Gram-positive cocci, sensitive to clindamycin, gentamicin, levofloxacin, rifampin, Bactrim, vancomycin, and tetracycline.  BRIEF HOSPITAL COURSE:  This is a  50 year old male with a history of Crohn disease on immunosuppressants, presenting with left elbow infectious bursitis positive for MRSA. 1. Left elbow MRSA infectious bursitis.  Orthopedics consulted.  White     blood count on admission elevated at 13.9.  The patient's right arm     was elevated in a sling to help with edema and the patient was     started on IV vancomycin.  Bursa fluid tapped and was significant     for Gram-positive cocci.  Orthopedist, Dr. [name], took the     patient to the OR for irrigation and drainage on hospital day 2.     Tenderness and cellulitis significantly improved following the     procedure.  Pain was well controlled at the time of discharge with     p.r.n. oxycodone.  The patient was afebrile throughout     hospitalization.  Culture grew out MRSA sensitive to doxycycline,     and the patient was transitioned to this antibiotic and asked to     take it for 2 weeks. 2. History of Crohn disease.  Stable on home immunosuppressants. 3. History of hypertension.  Stable on home diltiazem and olmesartan. 4. History of osteoporosis.  Continued on home Forteo. 5. History of hypokalemia.  Potassium within normal limits during  hospitalization.  Stable on home potassium chloride 20 mEq p.o.     daily. 6. History of migraines.  The patient had exacerbation.     Hospitalization likely secondary to infection and decreased p.o.     intake.  The headache improved with his home triptan, Relpax, and     with improved p.o. intake.  DISCHARGE INSTRUCTIONS: 1. Follow up with Dr. [name] at [facility] in 1-2 weeks. 2. Keep appointment with PCP, Dr. Verline [name], on November 29, 2010. 3. Call Dr. [name] if you develop fevers or worsening pain/swelling     in your left arm.    ______________________________ [name] Kays, MD   ______________________________ [name] [name] Ilir Mahrt, M.D.    AO/MEDQ  D:  11/22/2010  T:  11/23/2010  Job:  [id number]  cc:    Lind Guest. [name], M.D. Dr. Verline [name].  Electronically Signed by [name] Kays MD on 11/24/2010 11:20:17 AM Electronically Signed by Lissa Morales M.D. on 11/28/2010 01:22:09 PM

## 2010-12-02 ENCOUNTER — Encounter (HOSPITAL_BASED_OUTPATIENT_CLINIC_OR_DEPARTMENT_OTHER): Payer: 59

## 2010-12-03 NOTE — H&P (Addendum)
[name] [name], [name] [name]                 ACCOUNT NO.:  [id number]  MEDICAL RECORD NO.:  [id number]           PATIENT TYPE:  LOCATION:                                 FACILITY:  PHYSICIAN:  Wayne A. Walker Kehr, M.D.    DATE OF BIRTH:  17-Apr-1961  DATE OF ADMISSION:  11/17/2010 DATE OF DISCHARGE:                             HISTORY & PHYSICAL   PRIMARY CARE PHYSICIAN:  [facility].  CHIEF COMPLAINT:  Left elbow bursitis, cellulitis.  HISTORY OF PRESENT ILLNESS:  The patient is a 50 year old man who presented to [facility] today with left elbow pain and erythema. Pain and redness started on Nov 16, 2010, treated with doxycycline at [facility] and told to return if any worsening.  The patient returned to [facility] after 24 hours of antibiotics, redness and erythema spreading from elbow to upper and lower arms.  Original boundary was marked by MD on Nov 16, 2010, and it has spread beyond this mark.  The patient was sent to the hospital for direct admit for worsening left elbow cellulitis.  There is no current drainage.  This started on Nov 16, 2010, with a painful nodule with a small abrasion over top and this rapidly worsened per the patient report.  He has positive chills, positive sweats, positive fever at home, last checked at home was 100.0, pain in the elbow 9/10 currently.  ALLERGIES: 1. PENICILLIN causes rash. 2. SULFA causes rash. 3. DILAUDID causes nausea and vomiting.  PAST MEDICAL HISTORY: 1. Crohn disease, last flare October 21, 2010, admitted at [facility], on     chronic immunosuppressants. 2. Pneumonia, recurrent episodes, treated as outpatient second week of     May. 3. Hypertension. 4. Recurrent sinusitis. 5. Osteoporosis. 6. Hypokalemia that happened during September 24, 2010, admission.  PHYSICAL SURGICAL HISTORY: 1. Sinus surgery. 2. Right shoulder surgery.  MEDICATIONS: 1. Forteo injections daily. 2. Nexium 40 mg daily. 3. Asacol 800 mg t.i.d. 4.  Folic acid daily. 5. Entocort 3 tablets daily. 6. Pamine 5 mg b.i.d. 7. Diovan 160 mg daily, unsure of dose, recent dose change per PCP. 8. Mercaptopurine 100 mg daily. 9. Tiazac 240 mg daily. 10.Dairy Relief 3 tablets t.i.d. 11.Calcium t.i.d. 12.Florastor 1 tablet daily.  PHYSICAL EXAMINATION:  VITAL SIGNS:  Temp 96.9, pulse 75, respiratory rate 19, blood pressure 122/86, and pulse ox 95% on room air. GENERAL:  No acute distress, resting quietly on bed. HEENT:  Normocephalic, atraumatic.  Mucous membranes are moist. NECK:  Normal range of motion. CARDIOVASCULAR:  Regular rate and rhythm.  No murmurs, rubs, or gallops. S1 and S2 present. LUNGS:  Clear to auscultation bilateral.  No wheezing.  No crackles. ABDOMEN:  Soft with diffuse tenderness on palpation.  No rebound, no guarding. EXTREMITIES:  No lower extremity edema.  Left elbow exam, left elbow redness with small skin abrasion healing with no scab at olecranon of left elbow.  No fluctuance at elbow.  No joint effusion appreciated. Positive tenderness on palpation of the elbow and surrounding area. Good range of motion, but to movement causes increased pain. NEURO:  Alert and  oriented x3.  LABORATORY DATA AND STUDIES:  White blood cells 13.1, hemoglobin 13.6. Repeat CBC pending.  Repeat BMET pending.  The CBC obtained is from outside facility.  X-ray of left elbow obtained at [facility] was negative for periosteal elevation.  ASSESSMENT/PLAN:  The patient is a 50 year old male on immunosuppressive medications for Crohn disease who presents with olecranon bursitis and secondary cellulitis. 1. Left elbow bursitis and cellulitis, failed doxycycline x24 hours.     We will start vancomycin.  The patient is placed on cardiac     precautions for suspected MRSA.  Percocet for pain.  We will follow     physical exam.  Currently good range of motion.  Most likely not     septic joint.  Left elbow x-ray within normal limits.   We will     consider MRI if no rapid improvement with vancomycin. 2. Crohn disease.  We will continue immunosuppressant therapy per home     regimen for now.  Most likely, this is placing the patient at     increased risk for skin infection.  Yet, this is needed to control     flares of Crohn's, last flare October 21, 2010, admission. 3. Hypertension.  We will hold blood pressure medication for now.     Normally on Diovan and Tiazac, unsure of dosages, Pharmacy to     complete med reconciliation form.  We will restart blood pressure     medications if any elevation of blood pressure, but currently blood     pressure within normal limits. 4. Decreasing appetite recently as well as 20-pound weight loss in 6     weeks.  The patient states secondary to Crohn this is a chronic     issue for him.  We will order a regular diet.  The patient is to     eat as tolerated.  Wife is concerned about dehydration.  On exam,     he appears hydrated, mucous membranes moist.  We will give small     bolus of normal saline 500 mL. 5. Question of fever.  The patient states at home, last check was     100.0, no temp at [facility].  Motrin p.r.n. for fever.  We will     monitor fever curve. 6. Osteoporosis, on Forteo.  We will hold for now and restart at     discharge from hospital. 7. Hypokalemia.  History of potassium decrease in last     hospitalization, on potassium supplements at home.  We will recheck     potassium and replete as needed, BMET pending. 8. Fluids, electrolytes, nutrition and gastrointestinal prophylaxis.     The patient is on a regular diet.  We will give heparin subcu     t.i.d. 9. Disposition.  Pending clinical improvement.     Dorthey Sawyer, MD   ______________________________ Arty Baumgartner. Walker Kehr, M.D.    DC/MEDQ  D:  11/17/2010  T:  11/18/2010  Job:  [id number]  Electronically Signed by Candelaria Celeste M.D. on 12/03/2010 07:24:09 AM Electronically Signed by Dorthey Sawyer  on 12/31/2010  11:56:56 AM

## 2011-01-30 ENCOUNTER — Other Ambulatory Visit: Payer: Self-pay | Admitting: Dermatology

## 2011-02-15 ENCOUNTER — Other Ambulatory Visit: Payer: Self-pay | Admitting: Gastroenterology

## 2011-02-20 ENCOUNTER — Emergency Department (HOSPITAL_COMMUNITY): Payer: 59

## 2011-02-20 ENCOUNTER — Encounter: Payer: Self-pay | Admitting: Family Medicine

## 2011-02-20 ENCOUNTER — Observation Stay (HOSPITAL_COMMUNITY)
Admission: EM | Admit: 2011-02-20 | Discharge: 2011-02-23 | Disposition: A | Discharge status: Home / Self Care | Payer: 59 | Source: Ambulatory Visit | Attending: Family Medicine | Admitting: Family Medicine

## 2011-02-20 DIAGNOSIS — R4789 Other speech disturbances: Secondary | ICD-10-CM | POA: Insufficient documentation

## 2011-02-20 DIAGNOSIS — M5030 Other cervical disc degeneration, unspecified cervical region: Principal | ICD-10-CM | POA: Insufficient documentation

## 2011-02-20 DIAGNOSIS — IMO0002 Reserved for concepts with insufficient information to code with codable children: Secondary | ICD-10-CM | POA: Insufficient documentation

## 2011-02-20 DIAGNOSIS — K5090 Crohn's disease, unspecified, without complications: Secondary | ICD-10-CM | POA: Insufficient documentation

## 2011-02-20 DIAGNOSIS — E876 Hypokalemia: Secondary | ICD-10-CM

## 2011-02-20 DIAGNOSIS — T380X5A Adverse effect of glucocorticoids and synthetic analogues, initial encounter: Secondary | ICD-10-CM | POA: Insufficient documentation

## 2011-02-20 DIAGNOSIS — I10 Essential (primary) hypertension: Secondary | ICD-10-CM | POA: Insufficient documentation

## 2011-02-20 DIAGNOSIS — E119 Type 2 diabetes mellitus without complications: Secondary | ICD-10-CM | POA: Insufficient documentation

## 2011-02-20 DIAGNOSIS — M810 Age-related osteoporosis without current pathological fracture: Secondary | ICD-10-CM

## 2011-02-20 DIAGNOSIS — M818 Other osteoporosis without current pathological fracture: Secondary | ICD-10-CM | POA: Insufficient documentation

## 2011-02-20 DIAGNOSIS — R209 Unspecified disturbances of skin sensation: Secondary | ICD-10-CM | POA: Insufficient documentation

## 2011-02-20 LAB — DIFFERENTIAL
Basophils Absolute: 0 10*3/uL (ref 0.0–0.1)
Basophils Relative: 0 % (ref 0–1)
Eosinophils Absolute: 0 10*3/uL (ref 0.0–0.7)
Monocytes Relative: 9 % (ref 3–12)
Neutro Abs: 5.2 10*3/uL (ref 1.7–7.7)
Neutrophils Relative %: 64 % (ref 43–77)

## 2011-02-20 LAB — CK TOTAL AND CKMB (NOT AT ARMC)
Relative Index: INVALID (ref 0.0–2.5)
Total CK: 79 U/L (ref 7–232)

## 2011-02-20 LAB — BASIC METABOLIC PANEL
Chloride: 105 mEq/L (ref 96–112)
GFR calc Af Amer: >60 mL/min (ref >60)
GFR calc non Af Amer: >60 mL/min (ref >60)
Potassium: 2.9 mEq/L — ABNORMAL LOW (ref 3.5–5.1)

## 2011-02-20 LAB — CBC
Hemoglobin: 13.7 g/dL (ref 13.0–17.0)
Platelets: 225 10*3/uL (ref 150–400)
RBC: 4.18 MIL/uL — ABNORMAL LOW (ref 4.22–5.81)
WBC: 8.1 10*3/uL (ref 4.0–10.5)

## 2011-02-20 LAB — POCT I-STAT TROPONIN I: Troponin i, poc: 0.01 ng/mL (ref 0.00–0.08)

## 2011-02-20 LAB — URINALYSIS, ROUTINE W REFLEX MICROSCOPIC
Bilirubin Urine: NEGATIVE
Glucose, UA: NEGATIVE mg/dL
Hgb urine dipstick: NEGATIVE
Protein, ur: NEGATIVE mg/dL
Specific Gravity, Urine: 1.013 (ref 1.005–1.030)
Urobilinogen, UA: 0.2 mg/dL (ref 0.0–1.0)

## 2011-02-20 LAB — TROPONIN I: Troponin I: <0.3 ng/mL (ref <0.30)

## 2011-02-20 LAB — GLUCOSE, CAPILLARY: Glucose-Capillary: 249 mg/dL — ABNORMAL HIGH (ref 70–99)

## 2011-02-20 NOTE — H&P (Addendum)
[facility] Admission History and Physical  Patient name: [name] [name] record number: [id number] Date of birth: 1961-03-26 Age: 50 y.o. Gender: male  Primary Care Kasidy Gianino: [name], MD  Chief Complaint: pain and numbness in left arm History of Present Illness: [name] [name] is a 50 y.o. year old male with h/o Crohn's disease, HTN, osteoporosis, hypokalemia and recent debridement of left elbow for bursitis who presents with acute scapula pain that radiated to the left arm and was associated with numbness in the arm. He reports some numbness in his arm at baseline since his elbow surgery in May, but the numbness was worst today. His coworkers also noticed him having some slurred speech during this episode. The episode lasted 49mn and was at 7:30am today.  He complained of mild headache but no vision changes. He had an episode of bloody diarrhea yesterday and some associated dizziness and lightheadedness with standing. He also reports having some shortness of breath and mild chest pain yesterday evening.   Past Medical History: - Crohn's disease since the age of 50yo Large and small intestine as well as esophageal involvement.  - Osteoporosis from chronic prednisone use - Hypertension - GERD - hypokalemia - external hemorrhoids  - recurrent sinusitis  Past Surgical History: - sinus surgery - right elbow surgery - Irrigation and debridement of left elbow infected olecranon bursa with bursectomy 11/19/10  Social History: Denies tobacco, alcohol or illicit drug use. Works with IT department at [employer]   Family History: Mother: died of heart attack at 714yo  Father: CVA Family history of brain aneurysm Allergies: Allergies  Allergen Reactions  . Dilaudid (Hydromorphone Hcl)   . Penicillins   . Sulfonamide Derivatives    Home Medications:  Relpax 460mpo daily Metformin 50093mo daily Forteo 73m68maily Potassium chloride 73me44maily Refresh otc nexium 40mg 42maily methscopalamine bromide 5mg po34mab bid Mercaptopurine 50mg po83AN Folic acid 1mg po 29mly florastor 1 tab daily entocort ec 3mg 3 ca32maily  diovan 373mg po d69m Diltiazem 360mg po da61mDairy relief otc 3 tabs daily Calcium carbonate tid asacol 400mg po 2 t73mtid  Review Of Systems: Per HPI with the following additions: negative except per HPI Otherwise 12 point review of systems was performed and was unremarkable.  Physical Exam: Pulse: 97.7  Blood Pressure: 160/92 RR: 16  O2:96  on RA Temp: 97.7  General: alert, cooperative and no distress HEENT: PERRLA and extra ocular movement intact Heart: S1, S2 normal, no murmur, rub or gallop, regular rate and rhythm Lungs: clear to auscultation, no wheezes or rales and unlabored breathing Abdomen: abdomen is soft without significant tenderness, masses, organomegaly or guarding Extremities: extremities normal, atraumatic, no cyanosis or edema Skin:healed scar along left elbow. No erythema. Neurology: CN2-12 grossly intact, 4+/5 strength in upper extremities bilaterally. Non specific decreased sensation to light touch in left arm.  Musculoskeletal: bony tenderness along midthoracic spine.   Labs and Imaging: Lab Results  Component Value Date/Time   NA 142 02/20/2011 12:14 PM   K 2.9* 02/20/2011 12:14 PM   CL 105 02/20/2011 12:14 PM   CO2 27 02/20/2011 12:14 PM   BUN 12 02/20/2011 12:14 PM   CREATININE 0.69 02/20/2011 12:14 PM   GLUCOSE 106* 02/20/2011 12:14 PM   Lab Results  Component Value Date   WBC 8.1 02/20/2011   HGB 13.7 02/20/2011   HCT 40.0 02/20/2011   MCV 95.7 02/20/2011   PLT 225 02/20/2011  CK-MB: 3.5 and Troponin < 0.3. Head CT: No acute process   Assessment and Plan: [name] [name] is a 50 y.o. year old male presenting with left arm pain and weakness 1. Arm weakness and slurred speech: symptoms have resolved since this morning. Concerning for TIA. Will do head MRI and MRA. Will  check echo and carotid doppler. Will check lipid panel. Will monitor with neuro checks every 4 hours.  2. Left scapula pain: in light of history of osteoporosis, could be due to compression fracture. Will get plain films of thoracic and cervical spine.  3. Chest pain: will check troponins x 2. EKG was unchanged from previous. Echo ordered as part of TIA workup.  3. Hypokalemia: patient had run out of supplemental potassium at home for a couple of days. Most likely due to Crohn's disease. Was given 42mq KDur in ER with 159m potassium IV. Will recheck potassium in morning along with magnesium level.  4. Hypertension: will start patient on home diovan 5. Crohn's disease: will start patient back on home regimen. 2. FEN/GI: NPO until passes swallow study.  3. Prophylaxis: lovenox 4076md 4. Disposition: admit to tele floor.

## 2011-02-21 ENCOUNTER — Observation Stay (HOSPITAL_COMMUNITY): Payer: 59

## 2011-02-21 LAB — CARDIAC PANEL(CRET KIN+CKTOT+MB+TROPI)
Relative Index: INVALID (ref 0.0–2.5)
Total CK: 72 U/L (ref 7–232)
Troponin I: <0.3 ng/mL (ref <0.30)

## 2011-02-21 LAB — GLUCOSE, CAPILLARY: Glucose-Capillary: 105 mg/dL — ABNORMAL HIGH (ref 70–99)

## 2011-02-21 LAB — CBC
MCH: 33.2 pg (ref 26.0–34.0)
MCHC: 34.7 g/dL (ref 30.0–36.0)
MCV: 95.6 fL (ref 78.0–100.0)
Platelets: 253 10*3/uL (ref 150–400)
RDW: 14.1 % (ref 11.5–15.5)

## 2011-02-21 LAB — BASIC METABOLIC PANEL
BUN: 7 mg/dL (ref 6–23)
Calcium: 9 mg/dL (ref 8.4–10.5)
Creatinine, Ser: 0.62 mg/dL (ref 0.50–1.35)
GFR calc non Af Amer: >60 mL/min (ref >60)
Glucose, Bld: 100 mg/dL — ABNORMAL HIGH (ref 70–99)

## 2011-02-21 LAB — HEMOGLOBIN A1C: Hgb A1c MFr Bld: 7.3 % — ABNORMAL HIGH (ref <5.7)

## 2011-02-21 LAB — LIPID PANEL
LDL Cholesterol: 92 mg/dL (ref 0–99)
Triglycerides: 170 mg/dL — ABNORMAL HIGH (ref <150)
VLDL: 34 mg/dL (ref 0–40)

## 2011-02-21 LAB — MRSA PCR SCREENING: MRSA by PCR: POSITIVE — AB

## 2011-02-22 HISTORY — PX: TRANSTHORACIC ECHOCARDIOGRAM: SHX275

## 2011-02-22 LAB — CBC
HCT: 42.2 % (ref 39.0–52.0)
Hemoglobin: 14.4 g/dL (ref 13.0–17.0)
MCH: 32.9 pg (ref 26.0–34.0)
MCHC: 34.1 g/dL (ref 30.0–36.0)

## 2011-02-22 LAB — BASIC METABOLIC PANEL
BUN: 5 mg/dL — ABNORMAL LOW (ref 6–23)
Calcium: 9.2 mg/dL (ref 8.4–10.5)
GFR calc non Af Amer: >60 mL/min (ref >60)
Glucose, Bld: 124 mg/dL — ABNORMAL HIGH (ref 70–99)
Potassium: 3 mEq/L — ABNORMAL LOW (ref 3.5–5.1)

## 2011-02-22 LAB — GLUCOSE, CAPILLARY
Glucose-Capillary: 179 mg/dL — ABNORMAL HIGH (ref 70–99)
Glucose-Capillary: 93 mg/dL (ref 70–99)

## 2011-02-23 DIAGNOSIS — G459 Transient cerebral ischemic attack, unspecified: Secondary | ICD-10-CM

## 2011-02-23 DIAGNOSIS — R4789 Other speech disturbances: Secondary | ICD-10-CM

## 2011-02-23 LAB — CBC
HCT: 42.6 % (ref 39.0–52.0)
MCHC: 35 g/dL (ref 30.0–36.0)
Platelets: 244 10*3/uL (ref 150–400)
WBC: 7 10*3/uL (ref 4.0–10.5)

## 2011-02-23 LAB — BASIC METABOLIC PANEL
BUN: 6 mg/dL (ref 6–23)
CO2: 27 mEq/L (ref 19–32)
Calcium: 9.8 mg/dL (ref 8.4–10.5)
Chloride: 103 mEq/L (ref 96–112)
Creatinine, Ser: 0.63 mg/dL (ref 0.50–1.35)

## 2011-02-27 NOTE — H&P (Signed)
[name], [name] NO.:  [id number]  MEDICAL RECORD NO.:  [id number]  LOCATION:  2952                         FACILITY:  [facility]  PHYSICIAN:  Dickie La, MD        DATE OF BIRTH:  1961-03-26  DATE OF ADMISSION:  02/20/2011 DATE OF DISCHARGE:                             HISTORY & PHYSICAL   PRIMARY CARE PROVIDER:  [name], MD  CHIEF COMPLAINT:  Pain and numbness in the left arm.  HISTORY OF PRESENT ILLNESS:  [name] [name] is a 50 year old male with history of Crohn disease, hypertension, osteoporosis, hypokalemia, and recent debridement of left elbow for bursitis who presents with acute scapula pain that radiated to the left arm and was associated with numbness in the left arm.  He report some numbness in his arm at baseline since his elbow surgery in May, but the numbness was worse today.  His co-workers also noticed him having some slurred speech during this episode.  The episode lasted 30 minutes and started at 7:30 a.m. this morning.  He complained of mild headache, but no vision changes.  He had an episode of bloody diarrhea yesterday and some associated dizziness and lightheadedness with standing.  He also reports having some shortness of breath and mild chest pain yesterday evening.  PAST MEDICAL HISTORY: 1. Crohn disease since the age of 50 years old.  Large and small     intestine as well as esophageal involvement. 2. Osteoporosis from chronic prednisone use. 3. Hypertension. 4. GERD. 5. Hypokalemia. 6. External hemorrhoids. 7. Recurrent sinusitis.  PAST SURGICAL HISTORY: 1. Sinus surgery. 2. Right elbow surgery. 3. Irrigation and debridement of left elbow infected olecranon bursa     with bursectomy in May 2012.  SOCIAL HISTORY:  Denies tobacco, alcohol or illicit drug use.  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  FAMILY HISTORY:  Mother died of heart attack at 69 years old.  Father had a CVA and has a family history of brain  aneurysm.  ALLERGIES: 1. DILAUDID. 2. PENICILLIN. 3. SULFONAMIDE DERIVATIVES.  HOME MEDICATIONS: 1. Relpax 40 mg p.o. daily. 2. Metformin 500 mg p.o. daily. 3. Forteo 20 mcg daily. 4. Potassium chloride 20 mEq daily. 5. Refresh OTC. 6. Nexium 40 mg p.o. daily. 7. Methscopolamine bromide 5 mg p.o. 1 tablet b.i.d. 8. Mercaptopurine 50 mg p.o. b.i.d. 9. Folic acid 1 mg p.o. daily. 10.Florastor 1 tablet daily. 11.Entocort EC 3 mg 3 capsules daily. 12.Diovan 320 mg p.o. daily. 13.Diltiazem 360 mg p.o. daily. 14.Dairy Relief OTC 3 times daily. 15.Calcium carbonate t.i.d. 16.Asacol 400 mg p.o. 2 tablets t.i.d.  REVIEW OF SYSTEMS:  Negative except as per HPI.  PHYSICAL EXAMINATION:  VITAL SIGNS:  Pulse 97.7, blood pressure 160/92, respiratory rate 16, O2 is 96% on room air, and temperature 97.7. GENERAL:  Alert, cooperative and in no acute distress. HEENT:  Pupils equal, round, reactive to light and accommodation. Extraocular movements intact. HEART:  S1, S2 normal.  No murmurs, rubs, or gallops.  Regular rate and rhythm. LUNGS:  Clear to auscultation.  No wheezes or rales.  Nonlabored breathing. ABDOMEN:  Soft without significant tenderness, masses, organomegaly or guarding. EXTREMITIES:  Normal, atraumatic.  No cyanosis or edema. SKIN:  Healed scar along the left elbow.  No erythema. NEUROLOGY:  Cranial nerves II through XII grossly intact.  4+/5 strength in the upper extremities bilaterally.  Nonspecific decreased sensation to light in the left arm.  Normal gait. MUSCULOSKELETAL:  Bony tenderness along the mid thoracic spine.  LABORATORY DATA AND IMAGING:  Sodium 142, potassium 3.9, chloride 105, CO2 27, BUN 12, creatinine 0.69, and glucose 106.  WBC 8.1, hemoglobin 13.7, hematocrit 40, MCV 95.7, and platelets 225.  CK-MB 3.5 and troponin less than 0.3.  Head CT:  No acute process.  ASSESSMENT AND PLAN:  [name] [name] is a 50 year old male presenting with left arm pain and  weakness. 1. Arm weakness and slurred speech.  Symptoms have resolved since this     morning, concerning for transient ischemic attack.  We will do head     MRI and MRA.  We will check echo and carotid Doppler. We will also     check lipid panel and monitor with neuro checks every 4 hours. 2. Left scapula pain.  In light of history of osteoporosis could be     due to compression fracture.  We will get plain films of thoracic     and cervical spine. 3. Chest pain.  We will check troponins x2.  EKG was unchanged from     previous.  Echo ordered as part of the transient ischemic attack     workup. 4. Hypokalemia.  The patient had ran out of supplemental potassium at     home for couple of days, most likely reason for hypokalemia would     be Crohn disease, was given 40 mEq K-Dur in the ER with 10 mEq of     potassium IV.  We will recheck potassium in the morning along with     magnesium level. 5. Hypertension.  We will start the patient on home Diovan. 6. Crohn disease.  We will start the patient back on home regimen. 7. Fluids, electrolytes, nutrition, and gastrointestinal.  N.p.o.     until passing swallow study. 8. Prophylaxis.  Lovenox 40 mg daily. 9. Disposition.  Admit to telemetry floor.    ______________________________ Liam Graham, MD   ______________________________ Dickie La, MD    SL/MEDQ  D:  02/20/2011  T:  02/20/2011  Job:  [id number]  Electronically Signed by Liam Graham MD on 02/23/2011 02:03:19 PM Electronically Signed by Dorcas Mcmurray MD on 02/27/2011 03:09:55 PM

## 2011-03-18 LAB — URINE MICROSCOPIC-ADD ON

## 2011-03-18 LAB — URINALYSIS, ROUTINE W REFLEX MICROSCOPIC
Glucose, UA: NEGATIVE
Leukocytes, UA: NEGATIVE
pH: 5.5

## 2011-03-19 NOTE — Discharge Summary (Signed)
[name] [name], [name] [name] NO.:  [id number]  MEDICAL RECORD NO.:  [id number]  LOCATION:  5784                         FACILITY:  [facility]  PHYSICIAN:  Talbert Cage, M.D.DATE OF BIRTH:  Aug 18, 1960  DATE OF ADMISSION:  02/20/2011 DATE OF DISCHARGE:  02/23/2011                              DISCHARGE SUMMARY   DISCHARGE DIAGNOSES: 1. Degenerative changes of C-spine. 2. Crohn disease. 3. Hypokalemia. 4. Hypertension.  DISCHARGE MEDICATIONS: 1. Mesalamine DR 400 mg 2 tabs p.o. t.i.d. 2. Calcium carbonate 1 tab p.o. t.i.d. 3. Diltiazem CD 360 mg p.o. daily. 4. Diovan 320 mg 1 tab p.o. daily. 5. Dairy relief over-the-counter 3 tablets p.o. t.i.d. 6. Budesonide 30 mg 3 capsules p.o. daily. 7. Florastor 1 tab p.o. daily nightly. 8. Folic acid 1 mg p.o. daily. 9. Forteo 20 mcg subcu daily nightly. 10.Mercaptopurine 50 mg 2 tabs p.o. daily. 11.Metformin 500 mg p.o. daily. 12.Pamine 5 mg p.o. b.i.d. 13.Nexium 40 mg p.o. daily. 14.Potassium chloride 20 mEq 1 tab p.o. daily. 15.Relpax 40 mg p.o. daily p.r.n. for migraine.  PERTINENT LABORATORY VALUES:  On February 20, 2011, CBC with differential was unremarkable.  Cardiac enzymes were cycled x3 starting February 20, 2011 through February 21, 2011.  All sets were negative for any acute infarct or myocardial pain.  On February 19, 6961, a basic metabolic panel was significant for a potassium of 2.9, otherwise unremarkable.  On February 20, 2011, urinalysis was negative.  On February 21, 2011, fasting lipid panel cholesterol 172, triglycerides 170, HDL 46, LDL 92, VLDL 34 and ratio 3.7, hemoglobin A1c was 7.3.  On February 23, 9527, a basic metabolic panel was unremarkable.  Potassium improved to 4.2.  RADIOLOGY:  February 20, 2011, CT head without contrast showed no acute intracranial abnormality and mild age advanced cerebral atrophy for age. A chest x-ray two-view showed mild interstitial prominence maybe due to low lung  volumes and vascular crowding.  Early edema cannot be excluded. X-ray four view cervical spine showed no acute fracture, listhesis identified in the cervical spine, ligamentous injury not excluded, chronic osteopenia probably related to chronic disease.  Plain films of thoracic spine showed no acute fracture or  listhesis identified in thoracic spine osteopenia probably related to chronic disease.  On February 21, 2011, an MRI of the brain without contrast showed negative noncontrast MRI appearance of brain for age.  An MRI of the brain showed negative intracranial MRA.  On February 22, 2011, an echocardiogram showed normal left ventricle cavity size, mild wall thickness with concentric hypertrophy, normal systolic function with ejection fraction 55-65%, normal wall motion and grade 2 diastolic dysfunction.  On February 23, 2011, parietal ultrasound showed patent carotid arteries.  BRIEF HOSPITAL COURSE:  [name] [name] is a 50 year old gentleman with past medical history of Crohn disease, who presented to the hospital with acute onset of numbness, tingling and pain in his left arm as well as some chest pain concerning for stroke versus TIA as well as an acute coronary syndrome.  He was admitted for workup. 1. Neurology.  The patient's symptoms resolved and his stroke workup     including MRI, MRA of the brain, carotid ultrasounds and  echocardiogram were negative for source of emboli or acute stroke     itself.  It was felt the patient's symptoms may have been from a     musculoskeletal problem. 2. Acute coronary syndrome.  The patient's cardiac enzymes were     cycled.  EKGs were followed with no evidence of myocardial     ischemia.  It was felt that the chest pain was likely     musculoskeletal. 3. Hypokalemia.  The patient was severely hyperglycemic on admission.     Potassium was placed during this admission.  The patient was     supposed to be on potassium replacement at home.   However, had run     out.  He was sent home with a new prescription for potassium     chloride. 4. Gastroenterology.  The patient was kept on his home medications for     his Crohn's disease.  The patient did have some nausea and vomiting     during his hospitalization.  However, this had resolved by the time     he was discharged.  He was tolerating foods well and has stayed     hydrated. 5. Musculoskeletal.  There is concern that cervical degeneration may     have been the cause of the patient's presenting symptoms.  Symptoms     of the neck were inconclusive for definitive cause.  However, the     patient should follow up with his primary care provider Dr.     Reinaldo Berber at the [facility] for possible further imaging     versus physical therapy for his back and neck.  FOLLOWUP ISSUES/RECOMMENDATIONS:  We would ask Dr. Reinaldo Berber to follow up the patient's pain, ensure it is improving and no concerning signs. We would also ask to follow up his hypokalemia as well as his diabetes as his hemoglobin A1c was slightly elevated.  The patient was discharged home in stable medical condition.    ______________________________ Cletus Gash, MD   ______________________________ Talbert Cage, M.D.    CR/MEDQ  D:  02/23/2011  T:  02/24/2011  Job:  [id number]  Electronically Signed by Cletus Gash MD on 03/11/2011 09:06:22 AM Electronically Signed by Talbert Cage M.D. on 03/19/2011 11:07:16 AM

## 2011-04-11 ENCOUNTER — Other Ambulatory Visit: Payer: Self-pay | Admitting: Gastroenterology

## 2011-04-11 NOTE — Telephone Encounter (Signed)
NEEDS OFFICE VISIT FOR ANY FURTHER REFILLS! 

## 2011-05-12 ENCOUNTER — Other Ambulatory Visit: Payer: Self-pay | Admitting: Gastroenterology

## 2011-05-21 ENCOUNTER — Ambulatory Visit (INDEPENDENT_AMBULATORY_CARE_PROVIDER_SITE_OTHER): Payer: 59 | Admitting: Gastroenterology

## 2011-05-21 ENCOUNTER — Encounter: Payer: Self-pay | Admitting: Gastroenterology

## 2011-05-21 ENCOUNTER — Other Ambulatory Visit (INDEPENDENT_AMBULATORY_CARE_PROVIDER_SITE_OTHER): Payer: 59

## 2011-05-21 VITALS — BP 134/80 | HR 92 | Ht 71.0 in | Wt 263.0 lb

## 2011-05-21 DIAGNOSIS — R1084 Generalized abdominal pain: Secondary | ICD-10-CM

## 2011-05-21 DIAGNOSIS — K5080 Crohn's disease of both small and large intestine without complications: Secondary | ICD-10-CM

## 2011-05-21 DIAGNOSIS — R197 Diarrhea, unspecified: Secondary | ICD-10-CM

## 2011-05-21 DIAGNOSIS — Z86010 Personal history of colonic polyps: Secondary | ICD-10-CM

## 2011-05-21 LAB — HEPATIC FUNCTION PANEL
AST: 41 U/L — ABNORMAL HIGH (ref 0–37)
Alkaline Phosphatase: 77 U/L (ref 39–117)
Bilirubin, Direct: 0.1 mg/dL (ref 0.0–0.3)
Total Protein: 7 g/dL (ref 6.0–8.3)

## 2011-05-21 LAB — CBC WITH DIFFERENTIAL/PLATELET
Basophils Absolute: 0 10*3/uL (ref 0.0–0.1)
Eosinophils Absolute: 0 10*3/uL (ref 0.0–0.7)
HCT: 43.9 % (ref 39.0–52.0)
Hemoglobin: 14.9 g/dL (ref 13.0–17.0)
Lymphs Abs: 0.8 10*3/uL (ref 0.7–4.0)
MCHC: 33.9 g/dL (ref 30.0–36.0)
Neutro Abs: 7.4 10*3/uL (ref 1.4–7.7)
Platelets: 292 10*3/uL (ref 150.0–400.0)
RDW: 14.6 % (ref 11.5–14.6)

## 2011-05-21 LAB — BASIC METABOLIC PANEL
CO2: 21 mEq/L (ref 19–32)
Calcium: 9.1 mg/dL (ref 8.4–10.5)
GFR: 83.29 mL/min (ref >60.00)
Potassium: 4.1 mEq/L (ref 3.5–5.1)
Sodium: 138 mEq/L (ref 135–145)

## 2011-05-21 LAB — LIPASE: Lipase: 23 U/L (ref 11.0–59.0)

## 2011-05-21 LAB — TSH: TSH: 1.05 u[IU]/mL (ref 0.35–5.50)

## 2011-05-21 MED ORDER — ESOMEPRAZOLE MAGNESIUM 40 MG PO CPDR: 40.0000 mg | DELAYED_RELEASE_CAPSULE | Freq: Every day | ORAL | Status: DC

## 2011-05-21 MED ORDER — MESALAMINE 400 MG PO TBEC: 800.0000 mg | DELAYED_RELEASE_TABLET | Freq: Three times a day (TID) | ORAL | Status: DC

## 2011-05-21 MED ORDER — METHSCOPOLAMINE BROMIDE 5 MG PO TABS: 5.0000 mg | ORAL_TABLET | Freq: Two times a day (BID) | ORAL | Status: DC

## 2011-05-21 MED ORDER — BUDESONIDE 3 MG PO CP24: 9.0000 mg | ORAL_CAPSULE | ORAL | Status: DC

## 2011-05-21 MED ORDER — METRONIDAZOLE 500 MG PO TABS: 500.0000 mg | ORAL_TABLET | Freq: Two times a day (BID) | ORAL | Status: AC

## 2011-05-21 MED ORDER — HYDROCORTISONE ACETATE 25 MG RE SUPP: RECTAL | Status: DC

## 2011-05-21 MED ORDER — MERCAPTOPURINE 50 MG PO TABS: ORAL_TABLET | ORAL | Status: DC

## 2011-05-21 NOTE — Patient Instructions (Addendum)
Go directly to the basement to have your labs drawn today. Please do your stool studies prior to starting your antibiotic. Your prescription refills for Pamine, Entocort, Mercaptopurine, Asacol, and Nexium have been sent to your mail order pharmacy. A prescription for Anusol suppositories have also been sent to [facility] mail order pharmacy.     You have been scheduled for a CT scan of the abdomen and pelvis at [facility] are scheduled on 05/24/11 at 9:00am. You should arrive 15 minutes prior to your appointment time for registration. Please follow the written instructions below on the day of your exam:  WARNING: IF YOU ARE ALLERGIC TO IODINE/X-RAY DYE, PLEASE NOTIFY RADIOLOGY IMMEDIATELY AT [phone]! YOU WILL BE GIVEN A 13 HOUR PREMEDICATION PREP.  1) Do not eat or drink anything after 5:00am (4 hours prior to your test) 2) You have been given 2 bottles of oral contrast to drink. The solution may taste better if refrigerated, but do NOT add ice or any other liquid to this solution. Shake well before drinking.    Drink 1 bottle of contrast @ 7:00am (2 hours prior to your exam)  Drink 1 bottle of contrast @ 8:00am (1 hour prior to your exam)  You may take any medications as prescribed with a small amount of water except for the following: Metformin, Glucophage, Glucovance, Avandamet, Riomet, Fortamet, Actoplus Met, Janumet, Glumetza or Metaglip. The above medications must be held the day of the exam AND 48 hours after the exam.  The purpose of you drinking the oral contrast is to aid in the visualization of your intestinal tract. The contrast solution may cause some diarrhea. Before your exam is started, you will be given a small amount of fluid to drink. Depending on your individual set of symptoms, you may also receive an intravenous injection of x-ray contrast/dye. Plan on being at [facility] for 30 minutes or long, depending on the type of exam you are having  performed.  If you have any questions regarding your exam or if you need to reschedule, you may call the CT department at [phone] between the hours of 8:00 am and 5:00 pm, Monday-Friday.  ________________________________________________________________________  Rectal care instructions given.  cc: [name], MD

## 2011-05-21 NOTE — Progress Notes (Signed)
History of Present Illness: This is a 50 year old male with [name] who relates a 6 month history of increased diarrhea, intermittent generalized abdominal pain and occasional episodes of bright red blood per rectum. Over the past few months he has been evaluated for recurrent dizziness and syncope by both neurology and cardiology without a clear etiology uncovered. He has had a difficult time controlling hypokalemia. In April he developed a MRSA infection in his left elbow and underwent surgical drainage. He was placed on a prolonged course of antibiotics and took clindamycin for several months which was completed in September. He underwent colonoscopy in August 2011 which showed no activity of Crohn's however internal hemorrhoids and small adenomatous colon polyps were noted. He notes intermittent dysphagia and reflux symptoms. He has undergone several endoscopies for dysphasia without strictures or other esophageal disorders noted. Denies weight loss, constipation, change in stool caliber, melena, hematochezia, nausea, vomiting, chest pain.   Current Medications, Allergies, Past Medical History, Past Surgical History, Family History and Social History were reviewed in [employer] record.  Physical Exam: General: Well developed , well nourished, appears fatigued and chronically ill, no acute distress Head: Normocephalic and atraumatic Eyes:  sclerae anicteric, EOMI Ears: Normal auditory acuity Mouth: No deformity or lesions Lungs: Clear throughout to auscultation Heart: Regular rate and rhythm; no murmurs, rubs or bruits Abdomen: Soft, large, generalized mild tenderness to deep palpation without rebound or guarding and non distended. No masses, hepatosplenomegaly or hernias noted. Normal Bowel sounds Musculoskeletal: Symmetrical with no gross deformities  Pulses:  Normal pulses noted Extremities: No clubbing, cyanosis, edema or deformities noted Neurological: Alert  oriented x 4, grossly nonfocal Psychological:  Alert and cooperative. Normal mood and affect  Assessment and Recommendations:  1. Diarrhea, intermittent rectal bleeding and generalized abdominal pain. R/O C diff or other infectious diarrhea, hemorrhoidal bleeding, Crohn's flare. Schedule abd/pelivc CT, blood work, stool studies. Metronidazole for 2 weeks after stool studies collected and HC supp daily for presumed hemorrhoidal bleeding. Colonoscopy if symptoms do not respond to above.  2. Personal hx of adenomatous colon polyps. Surveillance colonoscopy in 01/2013.  3. GERD with intermittent dysphagia. Continue antireflux measures and Nexium 40 mg daily. Consider increasing Nexium to bid and a BA esophagram if dysphagia persists.  4. Unexplained syncope.

## 2011-05-22 LAB — CELIAC PANEL 10
Gliadin IgG: 8.7 U/mL (ref <20)
IgA: 236 mg/dL (ref 68–379)
Tissue Transglutaminase Ab, IgA: 8 U/mL (ref <20)

## 2011-05-23 ENCOUNTER — Other Ambulatory Visit: Payer: 59

## 2011-05-23 DIAGNOSIS — Z86010 Personal history of colonic polyps: Secondary | ICD-10-CM

## 2011-05-23 DIAGNOSIS — R197 Diarrhea, unspecified: Secondary | ICD-10-CM

## 2011-05-23 DIAGNOSIS — R1084 Generalized abdominal pain: Secondary | ICD-10-CM

## 2011-05-23 DIAGNOSIS — K5080 Crohn's disease of both small and large intestine without complications: Secondary | ICD-10-CM

## 2011-05-24 ENCOUNTER — Ambulatory Visit (HOSPITAL_COMMUNITY)
Admission: RE | Admit: 2011-05-24 | Discharge: 2011-05-24 | Disposition: A | Discharge status: Home / Self Care | Payer: 59 | Source: Ambulatory Visit | Attending: Gastroenterology | Admitting: Gastroenterology

## 2011-05-24 DIAGNOSIS — Z860100 Personal history of colon polyps, unspecified: Secondary | ICD-10-CM | POA: Insufficient documentation

## 2011-05-24 DIAGNOSIS — K5080 Crohn's disease of both small and large intestine without complications: Secondary | ICD-10-CM | POA: Insufficient documentation

## 2011-05-24 DIAGNOSIS — K7689 Other specified diseases of liver: Secondary | ICD-10-CM | POA: Insufficient documentation

## 2011-05-24 DIAGNOSIS — R197 Diarrhea, unspecified: Secondary | ICD-10-CM | POA: Insufficient documentation

## 2011-05-24 DIAGNOSIS — N200 Calculus of kidney: Secondary | ICD-10-CM | POA: Insufficient documentation

## 2011-05-24 DIAGNOSIS — R1084 Generalized abdominal pain: Secondary | ICD-10-CM | POA: Insufficient documentation

## 2011-05-24 LAB — FECAL LACTOFERRIN, QUANT: Lactoferrin: NEGATIVE

## 2011-05-24 LAB — CLOSTRIDIUM DIFFICILE BY PCR: Toxigenic C. Difficile by PCR: NOT DETECTED

## 2011-05-24 MED ORDER — IOHEXOL 300 MG/ML  SOLN
100.0000 mL | Freq: Once | INTRAMUSCULAR | Status: AC | PRN
  Administered 2011-05-24: 100 mL via INTRAVENOUS

## 2011-05-27 LAB — STOOL CULTURE

## 2011-05-29 ENCOUNTER — Ambulatory Visit: Payer: 59

## 2011-05-29 DIAGNOSIS — L03119 Cellulitis of unspecified part of limb: Secondary | ICD-10-CM

## 2011-05-29 DIAGNOSIS — M79609 Pain in unspecified limb: Secondary | ICD-10-CM

## 2011-05-31 ENCOUNTER — Ambulatory Visit (INDEPENDENT_AMBULATORY_CARE_PROVIDER_SITE_OTHER): Payer: 59

## 2011-05-31 DIAGNOSIS — L02419 Cutaneous abscess of limb, unspecified: Secondary | ICD-10-CM

## 2011-05-31 DIAGNOSIS — M25569 Pain in unspecified knee: Secondary | ICD-10-CM

## 2011-06-02 ENCOUNTER — Ambulatory Visit: Payer: 59

## 2011-06-02 DIAGNOSIS — L0231 Cutaneous abscess of buttock: Secondary | ICD-10-CM

## 2011-06-02 DIAGNOSIS — L03317 Cellulitis of buttock: Secondary | ICD-10-CM

## 2011-06-06 ENCOUNTER — Ambulatory Visit: Payer: 59 | Admitting: Gastroenterology

## 2011-06-06 ENCOUNTER — Ambulatory Visit (INDEPENDENT_AMBULATORY_CARE_PROVIDER_SITE_OTHER): Payer: 59 | Admitting: General Surgery

## 2011-06-06 ENCOUNTER — Encounter (INDEPENDENT_AMBULATORY_CARE_PROVIDER_SITE_OTHER): Payer: Self-pay | Admitting: General Surgery

## 2011-06-06 ENCOUNTER — Ambulatory Visit: Payer: 59 | Admitting: Family Medicine

## 2011-06-06 VITALS — BP 116/78 | HR 68 | Temp 97.3°F | Resp 18 | Ht 71.0 in | Wt 261.0 lb

## 2011-06-06 DIAGNOSIS — L03119 Cellulitis of unspecified part of limb: Secondary | ICD-10-CM

## 2011-06-06 DIAGNOSIS — L03319 Cellulitis of trunk, unspecified: Secondary | ICD-10-CM

## 2011-06-06 DIAGNOSIS — R197 Diarrhea, unspecified: Secondary | ICD-10-CM

## 2011-06-06 DIAGNOSIS — L02219 Cutaneous abscess of trunk, unspecified: Secondary | ICD-10-CM

## 2011-06-06 DIAGNOSIS — L02419 Cutaneous abscess of limb, unspecified: Secondary | ICD-10-CM

## 2011-06-06 NOTE — Patient Instructions (Signed)
Change gauze twice a day and as needed

## 2011-06-06 NOTE — Progress Notes (Addendum)
Subjective:     Patient ID: [name] [name], male   DOB: June 08, 1961, 50 y.o.   MRN: [id number]  HPI Patient has a history of Crohn's disease and has been on chronic steroids. He is a 6 week history of a persistent right thigh abscess. This was incised and drained at his primary care office however it has worsened again. Cultures showed Proteus and enterococcus. He is currently taking clindamycin and doxycycline. We are asked to see him in consultation by Dr. Joseph Art for possible incision and drainage.  Review of Systems     Objective:   Physical Exam  Cardiovascular: Normal rate, regular rhythm and normal heart sounds.   Pulmonary/Chest: Effort normal and breath sounds normal. No respiratory distress. He has no wheezes.  Abdominal: Soft. He exhibits no distension. There is no tenderness.  Right medial thigh has a 2 cm area of induration with mild erythema. This extends anteriorly from the previous I&D site. The area was prepped in a sterile fashion. Local anesthetic was injected. An incision was made over the fluctuant area. Some bloody purulent material is obtained. The wound was cleaned and a gauze dressing was applied.     Assessment:     Right thigh abscess    Plan:     This was incised and drained as described above. He will continue his antibiotics. Wound care instructions were given. He'll see him next week.

## 2011-06-10 ENCOUNTER — Telehealth (INDEPENDENT_AMBULATORY_CARE_PROVIDER_SITE_OTHER): Payer: Self-pay

## 2011-06-10 NOTE — Telephone Encounter (Signed)
error 

## 2011-06-12 ENCOUNTER — Encounter: Payer: Self-pay | Admitting: Gastroenterology

## 2011-06-12 ENCOUNTER — Ambulatory Visit (INDEPENDENT_AMBULATORY_CARE_PROVIDER_SITE_OTHER): Payer: 59 | Admitting: Gastroenterology

## 2011-06-12 VITALS — BP 100/72 | HR 80 | Ht 71.0 in | Wt 263.6 lb

## 2011-06-12 DIAGNOSIS — K5080 Crohn's disease of both small and large intestine without complications: Secondary | ICD-10-CM

## 2011-06-12 NOTE — Patient Instructions (Signed)
Please follow up in 3 months or sooner if needed.

## 2011-06-12 NOTE — Progress Notes (Signed)
History of Present Illness: This is a  a 50 year old male with Crohn's disease returning for followup. Recent CT scan was unremarkable. He still has mild abdominal pain and diarrhea. These symptoms have worsened since he was restarted on antibiotics for a perineal infection which was treated with incision and drainage and clindamycin and doxycycline. Followup is planned with his surgeon.  Current Medications, Allergies, Past Medical History, Past Surgical History, Family History and Social History were reviewed in [employer] record.  Physical Exam: General: Well developed , well nourished, no acute distress Head: Normocephalic and atraumatic Eyes:  sclerae anicteric, EOMI Ears: Normal auditory acuity Mouth: No deformity or lesions Lungs: Clear throughout to auscultation Heart: Regular rate and rhythm; no murmurs, rubs or bruits Abdomen: Soft, non tender and non distended. No masses, hepatosplenomegaly or hernias noted. Normal Bowel sounds Pulses:  Normal pulses noted Extremities: No clubbing, cyanosis, edema or deformities noted Neurological: Alert oriented x 4, grossly nonfocal Psychological:  Alert and cooperative. Normal mood and affect  Assessment and Recommendations:  1. Crohn's disease. Continue 6-mercaptopurine, 5-ASA and budesonide. His Crohn's disease appears to be stable at this time.  A portion of his diarrhea and abdominal pain may be secondary to his IBS and his current antibiotics. He is advised to remain on Florastor for at least one month after finishing antibiotics.  2. Perineal cellulitis. Continue antibiotics and followup with his surgeon.  3. Irritable bowel syndrome. Continue Pamine.

## 2011-06-13 ENCOUNTER — Encounter (INDEPENDENT_AMBULATORY_CARE_PROVIDER_SITE_OTHER): Payer: Self-pay | Admitting: General Surgery

## 2011-06-13 ENCOUNTER — Ambulatory Visit (INDEPENDENT_AMBULATORY_CARE_PROVIDER_SITE_OTHER): Payer: 59 | Admitting: General Surgery

## 2011-06-13 VITALS — BP 120/86 | HR 107 | Temp 97.8°F | Ht 71.0 in | Wt 266.0 lb

## 2011-06-13 DIAGNOSIS — L02214 Cutaneous abscess of groin: Secondary | ICD-10-CM

## 2011-06-13 DIAGNOSIS — L02219 Cutaneous abscess of trunk, unspecified: Secondary | ICD-10-CM

## 2011-06-13 NOTE — Progress Notes (Signed)
Subjective:     Patient ID: [name] [name], male   DOB: [birth year], 50 y.o.   MRN: [id number]  HPI Patient underwent incision and drainage of right colon abscess high on his upper right thigh last week. He is doing wound care at home. He completed his course of antibiotics. He is much better the  Review of Systems     Objective:   Physical Exam The wound still is opened a slight bit. It is clean granulation tissue no drainage. The induration and fluctuance have resolved. There is no cellulitis.    Assessment:     Doing very well status post incision and drainage of rectal abscess    Plan:     Local wound care. Followup p.r.n.

## 2011-06-27 ENCOUNTER — Encounter (INDEPENDENT_AMBULATORY_CARE_PROVIDER_SITE_OTHER): Payer: Self-pay | Admitting: Internal Medicine

## 2011-07-01 ENCOUNTER — Ambulatory Visit: Payer: 59

## 2011-07-01 DIAGNOSIS — M159 Polyosteoarthritis, unspecified: Secondary | ICD-10-CM

## 2011-07-01 DIAGNOSIS — L738 Other specified follicular disorders: Secondary | ICD-10-CM

## 2011-07-01 DIAGNOSIS — R55 Syncope and collapse: Secondary | ICD-10-CM

## 2011-08-06 ENCOUNTER — Ambulatory Visit: Payer: 59 | Admitting: Family Medicine

## 2011-08-06 ENCOUNTER — Encounter: Payer: Self-pay | Admitting: Family Medicine

## 2011-08-06 VITALS — BP 124/84 | HR 80 | Temp 98.0°F | Resp 18 | Ht 70.0 in | Wt 264.2 lb

## 2011-08-06 DIAGNOSIS — R404 Transient alteration of awareness: Secondary | ICD-10-CM

## 2011-08-06 DIAGNOSIS — R410 Disorientation, unspecified: Secondary | ICD-10-CM

## 2011-08-06 DIAGNOSIS — R0789 Other chest pain: Secondary | ICD-10-CM

## 2011-08-06 DIAGNOSIS — E119 Type 2 diabetes mellitus without complications: Secondary | ICD-10-CM

## 2011-08-06 LAB — COMPREHENSIVE METABOLIC PANEL
ALT: 89 U/L — ABNORMAL HIGH (ref 0–53)
AST: 39 U/L — ABNORMAL HIGH (ref 0–37)
Albumin: 4.3 g/dL (ref 3.5–5.2)
Alkaline Phosphatase: 84 U/L (ref 39–117)
BUN: 17 mg/dL (ref 6–23)
CO2: 24 mEq/L (ref 19–32)
Calcium: 9.7 mg/dL (ref 8.4–10.5)
Chloride: 103 mEq/L (ref 96–112)
Creat: 0.91 mg/dL (ref 0.50–1.35)
Glucose, Bld: 83 mg/dL (ref 70–99)
Potassium: 3.9 mEq/L (ref 3.5–5.3)
Sodium: 140 mEq/L (ref 135–145)
Total Bilirubin: 0.8 mg/dL (ref 0.3–1.2)
Total Protein: 7.1 g/dL (ref 6.0–8.3)

## 2011-08-06 LAB — POCT CBC
Granulocyte percent: 59.6 %G (ref 37–80)
HCT, POC: 47.2 % (ref 43.5–53.7)
Hemoglobin: 15.6 g/dL (ref 14.1–18.1)
Lymph, poc: 2.8 (ref 0.6–3.4)
MCH, POC: 32.2 pg — AB (ref 27–31.2)
MCHC: 33.1 g/dL (ref 31.8–35.4)
MCV: 97.3 fL — AB (ref 80–97)
MID (cbc): 1.4 — AB (ref 0–0.9)
MPV: 9.7 fL (ref 0–99.8)
POC Granulocyte: 6.3 (ref 2–6.9)
POC LYMPH PERCENT: 26.8 %L (ref 10–50)
POC MID %: 13.6 %M — AB (ref 0–12)
Platelet Count, POC: 345 10*3/uL (ref 142–424)
RBC: 4.85 M/uL (ref 4.69–6.13)
RDW, POC: 15.5 %
WBC: 10.6 10*3/uL — AB (ref 4.6–10.2)

## 2011-08-06 LAB — POCT URINALYSIS DIPSTICK
Bilirubin, UA: NEGATIVE
Blood, UA: NEGATIVE
Glucose, UA: NEGATIVE
Ketones, UA: NEGATIVE
Leukocytes, UA: NEGATIVE
Nitrite, UA: NEGATIVE
Protein, UA: NEGATIVE
Spec Grav, UA: 1.025
Urobilinogen, UA: 0.2
pH, UA: 5.5

## 2011-08-06 LAB — TSH: TSH: 1.723 u[IU]/mL (ref 0.350–4.500)

## 2011-08-06 LAB — POCT UA - MICROSCOPIC ONLY
Bacteria, U Microscopic: NEGATIVE
Casts, Ur, LPF, POC: NEGATIVE
Crystals, Ur, HPF, POC: NEGATIVE
Epithelial cells, urine per micros: NEGATIVE
Yeast, UA: NEGATIVE

## 2011-08-06 LAB — GLUCOSE, POCT (MANUAL RESULT ENTRY): POC Glucose: 71

## 2011-08-06 LAB — POCT GLYCOSYLATED HEMOGLOBIN (HGB A1C): Hemoglobin A1C: 8

## 2011-08-06 LAB — VITAMIN B12: Vitamin B-12: >2000 pg/mL — ABNORMAL HIGH (ref 211–911)

## 2011-08-06 LAB — POCT SEDIMENTATION RATE: POCT SED RATE: 30 mm/hr — AB (ref 0–22)

## 2011-08-06 MED ORDER — METFORMIN HCL 500 MG PO TABS: 500.0000 mg | ORAL_TABLET | Freq: Two times a day (BID) | ORAL | Status: DC

## 2011-08-06 MED ORDER — TERIPARATIDE (RECOMBINANT) 600 MCG/2.4ML ~~LOC~~ SOLN: 20.0000 ug | Freq: Every morning | SUBCUTANEOUS | Status: DC

## 2011-08-06 MED ORDER — VALSARTAN 320 MG PO TABS: 320.0000 mg | ORAL_TABLET | Freq: Every day | ORAL | Status: DC

## 2011-08-06 NOTE — Progress Notes (Signed)
51 yo with change in mental status seen with brought in by wife.  His first episode was in January 7th, with multiple episodes of feeling like he is in a fog and cannot recall past events well.  The initial episode wsa characterized by loss of speech and just not responding for several minutes(did not follow 1 step commands but was awake), followed by belligerence and aphasia(slurred speech), then headache.  Feels like he is flu-like.  Cannot concentrate.  Trouble remembering passwords (works at IT at [employer])  Sleeping better with CPAP machine.  Crohn's has been flaring despite not eating today had 4 BM's(mucous)  Has not checked sugar today,  But sugar was high the last time he was checked with this in January.    O:  Alert, cooperative Moving 4 extremities equally Speech normal CN III-XII:  Intact Throat:  Clear Neck: supple without adenop Chest:  Clear Heart:  Reg without murmur Abdomen: soft, diffusely tender Results for orders placed in visit on 08/06/11  POCT GLYCOSYLATED HEMOGLOBIN (HGB A1C)      Component Value Range   Hemoglobin A1C 8.0    GLUCOSE, POCT (MANUAL RESULT ENTRY)      Component Value Range   POC Glucose 71    POCT URINALYSIS DIPSTICK      Component Value Range   Color, UA yellow     Clarity, UA clear     Glucose, UA neg     Bilirubin, UA neg     Ketones, UA neg     Spec Grav, UA 1.025     Blood, UA neg     pH, UA 5.5     Protein, UA neg     Urobilinogen, UA 0.2     Nitrite, UA neg     Leukocytes, UA Negative    POCT UA - MICROSCOPIC ONLY      Component Value Range   WBC, Ur, HPF, POC 0-2     RBC, urine, microscopic 0-1     Bacteria, U Microscopic neg     Mucus, UA trace     Epithelial cells, urine per micros neg     Crystals, Ur, HPF, POC neg     Casts, Ur, LPF, POC neg     Yeast, UA neg    A:  Unexplained episodes of confusion  P: MR of brain without contrast Other labs pending Neuro consult on Monday

## 2011-08-07 ENCOUNTER — Telehealth: Payer: Self-pay | Admitting: Family Medicine

## 2011-08-07 NOTE — Telephone Encounter (Signed)
[name] [name] is still feeling like he is in a fog.  I gave him the normal lab results and will pursue the MR of the brain.

## 2011-08-08 ENCOUNTER — Other Ambulatory Visit: Payer: Self-pay | Admitting: Family Medicine

## 2011-08-08 ENCOUNTER — Telehealth: Payer: Self-pay

## 2011-08-08 DIAGNOSIS — R410 Disorientation, unspecified: Secondary | ICD-10-CM

## 2011-08-08 NOTE — Telephone Encounter (Signed)
Pt is scheduled for mri next Tuesday the 19th he would like to get some "calm down" meds because this freaks him out please call him back pharmacy is [facility] on [address]

## 2011-08-09 NOTE — Telephone Encounter (Signed)
Meds for claustrophobia? Or because he's nervous about the procedure itself?

## 2011-08-10 NOTE — Telephone Encounter (Signed)
Called in Rx to [facility] on [address], pts wife notified

## 2011-08-10 NOTE — Telephone Encounter (Signed)
OK to call in Xanax 0.5 mg 2 po 1 hr prior to procedure. #2

## 2011-08-10 NOTE — Telephone Encounter (Signed)
Spoke with pt he states for both reasons more so for claustrophobia. Call pt on wifes cell phone [phone]

## 2011-08-11 ENCOUNTER — Telehealth: Payer: Self-pay

## 2011-08-11 NOTE — Telephone Encounter (Signed)
Pt brought a prescription to [facility] [phone] for diabetic testing supplies from Dr L.  Pharmacy needs to know how often dr wants him to test.  Date of script 08/06/11 MR [phone]

## 2011-08-12 NOTE — Telephone Encounter (Signed)
[facility] pharmacy is calling they need to know the quanity on lanset test strips for pt rx

## 2011-08-13 ENCOUNTER — Ambulatory Visit (HOSPITAL_COMMUNITY)
Admission: RE | Admit: 2011-08-13 | Discharge: 2011-08-13 | Disposition: A | Discharge status: Home / Self Care | Payer: 59 | Source: Ambulatory Visit | Attending: Family Medicine | Admitting: Family Medicine

## 2011-08-13 ENCOUNTER — Ambulatory Visit (HOSPITAL_COMMUNITY): Admission: RE | Admit: 2011-08-13 | Payer: 59 | Source: Ambulatory Visit

## 2011-08-13 DIAGNOSIS — R4789 Other speech disturbances: Secondary | ICD-10-CM | POA: Insufficient documentation

## 2011-08-13 DIAGNOSIS — R410 Disorientation, unspecified: Secondary | ICD-10-CM

## 2011-08-13 DIAGNOSIS — E119 Type 2 diabetes mellitus without complications: Secondary | ICD-10-CM | POA: Insufficient documentation

## 2011-08-13 DIAGNOSIS — F29 Unspecified psychosis not due to a substance or known physiological condition: Secondary | ICD-10-CM | POA: Insufficient documentation

## 2011-08-13 DIAGNOSIS — I679 Cerebrovascular disease, unspecified: Secondary | ICD-10-CM | POA: Insufficient documentation

## 2011-08-13 DIAGNOSIS — I10 Essential (primary) hypertension: Secondary | ICD-10-CM | POA: Insufficient documentation

## 2011-08-13 DIAGNOSIS — R4701 Aphasia: Secondary | ICD-10-CM | POA: Insufficient documentation

## 2011-08-13 NOTE — Telephone Encounter (Signed)
It should be qd testing.

## 2011-08-13 NOTE — Telephone Encounter (Signed)
Spoke with pharmacist and gave him info QD testing.

## 2011-08-15 ENCOUNTER — Ambulatory Visit: Payer: 59 | Admitting: Family Medicine

## 2011-08-15 DIAGNOSIS — J45909 Unspecified asthma, uncomplicated: Secondary | ICD-10-CM

## 2011-08-15 DIAGNOSIS — J329 Chronic sinusitis, unspecified: Secondary | ICD-10-CM

## 2011-08-15 DIAGNOSIS — E119 Type 2 diabetes mellitus without complications: Secondary | ICD-10-CM

## 2011-08-15 LAB — POCT CBC
Granulocyte percent: 71.3 %G (ref 37–80)
MID (cbc): 1 — AB (ref 0–0.9)
MPV: 9.4 fL (ref 0–99.8)
POC MID %: 10.8 %M (ref 0–12)
Platelet Count, POC: 255 10*3/uL (ref 142–424)
RBC: 4.6 M/uL — AB (ref 4.69–6.13)

## 2011-08-15 LAB — GLUCOSE, POCT (MANUAL RESULT ENTRY): POC Glucose: 273

## 2011-08-15 MED ORDER — HYDROCODONE-HOMATROPINE 5-1.5 MG/5ML PO SYRP: 5.0000 mL | ORAL_SOLUTION | Freq: Three times a day (TID) | ORAL | Status: DC | PRN

## 2011-08-15 MED ORDER — AZITHROMYCIN 250 MG PO TABS: ORAL_TABLET | ORAL | Status: AC

## 2011-08-15 NOTE — Progress Notes (Signed)
  Subjective:    Patient ID: [name] [name], male    DOB: 02/06/1961, 51 y.o.   MRN: [id number]  HPI  Patient presents with 3 day illness of facial congestion, vertiginous symptoms and cough productive of purulent sputum. Symptoms progressive, now with facial discomfort.  Started with URI symptoms as multiple family members with similar symptoms.   Acknowledges fever 101 yesterday and wheezing.  PMH/ [name]            Type 2 DM Review of Systems     Objective:   Physical Exam  Constitutional: He appears well-developed and well-nourished.  HENT:  Nose: Rhinorrhea (milky yellow) present.  Mouth/Throat: Posterior oropharyngeal erythema present.  Cardiovascular: Normal rate, regular rhythm and normal heart sounds.   Pulmonary/Chest: Wheezes: coarse breath sounds with end expiratory wheezes, excellent air movement.     Results for orders placed in visit on 08/15/11  POCT CBC      Component Value Range   WBC 9.5  4.6 - 10.2 (K/uL)   Lymph, poc 1.7  0.6 - 3.4    POC LYMPH PERCENT 17.9  10 - 50 (%L)   MID (cbc) 1.0 (*) 0 - 0.9    POC MID % 10.8  0 - 12 (%M)   POC Granulocyte 6.8  2 - 6.9    Granulocyte percent 71.3  37 - 80 (%G)   RBC 4.60 (*) 4.69 - 6.13 (M/uL)   Hemoglobin 14.4  14.1 - 18.1 (g/dL)   HCT, POC 45.5  43.5 - 53.7 (%)   MCV 99.0 (*) 80 - 97 (fL)   MCH, POC 31.3 (*) 27 - 31.2 (pg)   MCHC 31.6 (*) 31.8 - 35.4 (g/dL)   RDW, POC 15.2     Platelet Count, POC 255  142 - 424 (K/uL)   MPV 9.4  0 - 99.8 (fL)  GLUCOSE, POCT (MANUAL RESULT ENTRY)      Component Value Range   POC Glucose 273       Assessment & Plan:   1. Sinusitis  POCT CBC, azithromycin (ZITHROMAX) 250 MG tablet, HYDROcodone-homatropine (HYCODAN) 5-1.5 MG/5ML syrup, Dulera 200/5 1 puff BID(sample provided)  2. Diabetes mellitus  POCT glucose (manual entry), Lantus 12 units [place] in office; open Lantus pen given to patient with instructions should blood sugar rise over 250.

## 2011-08-16 ENCOUNTER — Emergency Department (HOSPITAL_COMMUNITY)
Admission: EM | Admit: 2011-08-16 | Discharge: 2011-08-16 | Disposition: A | Discharge status: Home Health | Payer: 59 | Attending: Emergency Medicine | Admitting: Emergency Medicine

## 2011-08-16 ENCOUNTER — Encounter (HOSPITAL_COMMUNITY): Payer: Self-pay | Admitting: Emergency Medicine

## 2011-08-16 ENCOUNTER — Telehealth: Payer: Self-pay

## 2011-08-16 ENCOUNTER — Ambulatory Visit (INDEPENDENT_AMBULATORY_CARE_PROVIDER_SITE_OTHER): Payer: 59 | Admitting: Family Medicine

## 2011-08-16 DIAGNOSIS — R232 Flushing: Secondary | ICD-10-CM | POA: Insufficient documentation

## 2011-08-16 DIAGNOSIS — R062 Wheezing: Secondary | ICD-10-CM | POA: Insufficient documentation

## 2011-08-16 DIAGNOSIS — E785 Hyperlipidemia, unspecified: Secondary | ICD-10-CM | POA: Insufficient documentation

## 2011-08-16 DIAGNOSIS — T783XXA Angioneurotic edema, initial encounter: Secondary | ICD-10-CM

## 2011-08-16 DIAGNOSIS — T7840XA Allergy, unspecified, initial encounter: Secondary | ICD-10-CM

## 2011-08-16 DIAGNOSIS — R0602 Shortness of breath: Secondary | ICD-10-CM | POA: Insufficient documentation

## 2011-08-16 DIAGNOSIS — I10 Essential (primary) hypertension: Secondary | ICD-10-CM | POA: Insufficient documentation

## 2011-08-16 DIAGNOSIS — J329 Chronic sinusitis, unspecified: Secondary | ICD-10-CM

## 2011-08-16 DIAGNOSIS — R1310 Dysphagia, unspecified: Secondary | ICD-10-CM | POA: Insufficient documentation

## 2011-08-16 DIAGNOSIS — R220 Localized swelling, mass and lump, head: Secondary | ICD-10-CM | POA: Insufficient documentation

## 2011-08-16 DIAGNOSIS — M810 Age-related osteoporosis without current pathological fracture: Secondary | ICD-10-CM | POA: Insufficient documentation

## 2011-08-16 DIAGNOSIS — H5789 Other specified disorders of eye and adnexa: Secondary | ICD-10-CM | POA: Insufficient documentation

## 2011-08-16 DIAGNOSIS — T363X5A Adverse effect of macrolides, initial encounter: Secondary | ICD-10-CM | POA: Insufficient documentation

## 2011-08-16 DIAGNOSIS — K219 Gastro-esophageal reflux disease without esophagitis: Secondary | ICD-10-CM | POA: Insufficient documentation

## 2011-08-16 DIAGNOSIS — E119 Type 2 diabetes mellitus without complications: Secondary | ICD-10-CM | POA: Insufficient documentation

## 2011-08-16 DIAGNOSIS — Z79899 Other long term (current) drug therapy: Secondary | ICD-10-CM | POA: Insufficient documentation

## 2011-08-16 LAB — POCT CBC
HCT, POC: 43.5 % (ref 43.5–53.7)
Hemoglobin: 14 g/dL — AB (ref 14.1–18.1)
Lymph, poc: 1.5 (ref 0.6–3.4)
MCH, POC: 31.8 pg — AB (ref 27–31.2)
MCHC: 32.2 g/dL (ref 31.8–35.4)
WBC: 7.7 10*3/uL (ref 4.6–10.2)

## 2011-08-16 LAB — GLUCOSE, POCT (MANUAL RESULT ENTRY): POC Glucose: 237

## 2011-08-16 MED ORDER — DOXYCYCLINE HYCLATE 100 MG PO CAPS: 100.0000 mg | ORAL_CAPSULE | Freq: Two times a day (BID) | ORAL | Status: DC

## 2011-08-16 MED ORDER — FAMOTIDINE 20 MG PO TABS
20.0000 mg | ORAL_TABLET | Freq: Once | ORAL | Status: AC
  Administered 2011-08-16: 20 mg via ORAL
  Filled 2011-08-16: qty 1

## 2011-08-16 MED ORDER — METHYLPREDNISOLONE SODIUM SUCC 125 MG IJ SOLR
125.0000 mg | Freq: Once | INTRAMUSCULAR | Status: AC
  Administered 2011-08-16: 125 mg via INTRAVENOUS

## 2011-08-16 MED ORDER — DIPHENHYDRAMINE HCL 50 MG/ML IJ SOLN
25.0000 mg | Freq: Once | INTRAMUSCULAR | Status: AC
  Administered 2011-08-16: 25 mg via INTRAMUSCULAR

## 2011-08-16 MED ORDER — DIPHENHYDRAMINE HCL 50 MG/ML IJ SOLN
25.0000 mg | Freq: Once | INTRAMUSCULAR | Status: AC
  Administered 2011-08-16: 25 mg via INTRAVENOUS
  Filled 2011-08-16: qty 1

## 2011-08-16 NOTE — Telephone Encounter (Signed)
.  [facility] [name] [name] STATES HER HUSBAND SAW DR RICHTER LAST NIGHT AND WAS TOLD TO CHECK HIS SUGAR. HE HAD A FEW ISSUES LAST NIGHT AND HIS SUGAR WAS OVER 350. NEED TO KNOW WHAT TO DO. I TRIED ADVISING HER TO BRING HIM IN.  [place] [phone]

## 2011-08-16 NOTE — Discharge Instructions (Signed)
Since the exact cause of your allergy is unknown.  I recommend that you stop taking the Azithromycin and the cough suppressant.  Start taking Doxycycline as directed.  Read instructions below to learn more about your diagnosis and for reasons to return to the ED. Followup with your doctor if symptoms are not improving after 1 day.  You may return to the emergency department if symptoms worsen, become progressive, or become more concerning.  Allergic Reaction  There are many allergens around [place]. It may be difficult to know what caused your reaction. You may follow up with an allergy specialist for further testing to learn more about your specific allergies.   TREATMENT AND HOME CARE INSTRUCTIONS  If hives or rash are present:  Use an over-the-counter antihistamine (Benadryl or Zyrtec) for hives and itching as needed. Do not drive or drink alcohol until medications used to treat the reaction have worn off. Antihistamines tend to make people sleepy.  Apply cold cloths (compresses) to the skin or take baths in cool water. This will help itching. Avoid hot baths or showers. Heat will make a rash and itching worse.  See Your Primary Care Doctor if:  Your allergies are becoming progressively more troublesome.  You suspect a food allergy. Symptoms generally happen within 30 minutes of eating a food.  Your symptoms have not gone away within 2 days.  SEEK IMMEDIATE MEDICAL CARE IF:  You develop difficulty breathing or wheezing, or have a tight feeling in your chest or throat (feeling like your throat is closing) You develop swollen lips or tongue You develop hives on your chest, neck or face. You are unable to swallow fluids or salvia secretions.   A severe reaction with any of the above problems should be considered life-threatening. If you suddenly develop difficulty breathing call for local emergency medical help. THIS IS AN EMERGENCY.

## 2011-08-16 NOTE — Telephone Encounter (Signed)
Patient was seen urgently in the office this morning by Dr Darron Doom and was transported by EMS to ED.

## 2011-08-16 NOTE — Progress Notes (Signed)
  Subjective:    Patient ID: [name] [name], male    DOB: 1961-05-04, 51 y.o.   MRN: [id number]  HPI Patients complaining of facial and neck swelling after starting azithromycin, Dulera and Lantus insulin for sinusitis associated with reactive airways. Secondary to an elevated blood sugar patient given 12 units  of Lantus. Overnight blood sugar climbed to the 300's and patient gave himself an additional 8 units of insulin.   Patient has had Azithromycin multiple times in the past.  Patient has never had Lantus insulin.  Patient denies lip or tongue swelling.  Does state breathing is more difficult.   Review of Systems     Objective:   Physical Exam  Vitals reviewed. Constitutional: He appears well-developed and well-nourished.  HENT:  Head: Normocephalic and atraumatic.  Mouth/Throat: Oral lesions: no lip or tongue swelling. No uvula swelling. No posterior oropharyngeal edema.  Cardiovascular: Normal rate, regular rhythm and normal heart sounds.   Pulmonary/Chest: Effort normal.  Neurological: He is alert.  Skin:        .  Results for orders placed in visit on 08/16/11  POCT CBC      Component Value Range   WBC 7.7  4.6 - 10.2 (K/uL)   Lymph, poc 1.5  0.6 - 3.4    POC LYMPH PERCENT 18.9  10 - 50 (%L)   MID (cbc) 0.6  0 - 0.9    POC MID % 8.0  0 - 12 (%M)   POC Granulocyte 5.6  2 - 6.9    Granulocyte percent 73.1  37 - 80 (%G)   RBC 4.40 (*) 4.69 - 6.13 (M/uL)   Hemoglobin 14.0 (*) 14.1 - 18.1 (g/dL)   HCT, POC 43.5  43.5 - 53.7 (%)   MCV 98.9 (*) 80 - 97 (fL)   MCH, POC 31.8 (*) 27 - 31.2 (pg)   MCHC 32.2  31.8 - 35.4 (g/dL)   RDW, POC 15.8     Platelet Count, POC 213  142 - 424 (K/uL)   MPV 9.3  0 - 99.8 (fL)  GLUCOSE, POCT (MANUAL RESULT ENTRY)      Component Value Range   POC Glucose 237         Assessment & Plan:   1. Angioedema  diphenhydrAMINE (BENADRYL) injection 25 mg, Solumedrol 125 IV, Zantac 332m, 02, IV access, Epi pen at bedside  2. DM  (diabetes mellitus)  POCT glucose (manual entry)  3. Sinusitis  POCT CBC   EMS to transport EDP notified

## 2011-08-16 NOTE — Telephone Encounter (Signed)
Pt is calling back stating that his sugar is 475. And would like a call back -stated that dr Darron Doom would give him instrutions on what to do next

## 2011-08-16 NOTE — ED Provider Notes (Signed)
History     CSN: [id number]  Arrival date & time 08/16/11  9528   First MD Initiated Contact with Patient 08/16/11 [phone]      Chief Complaint  Patient presents with  . Allergic Reaction    coming from [facility], seen there yesterday for possible pneumonia, started on zithromax and today had had facial itching and redness and mild swelling.  no sob.     (Consider location/radiation/quality/duration/timing/severity/associated sxs/prior treatment) HPI Comments: Patient reports that he began noticing swelling and redness of his face approximately 8 hours ago.  Patient feels that his throat is swollen and that he is having some difficulty swallowing.  He reports that he is also feeling mildly short of breath.  He was at Urgent Care just prior to arrival and was given IV Solumedrol, Zantac, and 49m Benadryl.  He reports that his symptoms have improved since given the medication.  He denies a rash.  He states that he was at Urgent Care yesterday and was treated with Azithromycin to treat Pnemonia and Acute Sinusitis.  He was also given a cough suppressant containing Codeine, and Lantus.  He reports that he has taken Azithromycin and used this cough suppressant many times in the past without problems.  Yesterday was the first time that he had Lantus.    Patient is a 51y.o. male presenting with allergic reaction. The history is provided by the patient.  Allergic Reaction The primary symptoms are  wheezing and shortness of breath. The primary symptoms do not include cough, abdominal pain, nausea, vomiting, diarrhea, dizziness, palpitations, altered mental status, rash or urticaria.  Significant symptoms also include eye redness and flushing. Significant symptoms that are not present include itching.    Past Medical History  Diagnosis Date  . Hypertension   . GERD (gastroesophageal reflux disease)   . Adenomatous colon polyp 10/1999  . Osteoporosis   . External hemorrhoids   . Crohn's  disease of small and large intestines 1999  . Pneumonia   . Esophageal stricture   . MRSA (methicillin resistant Staphylococcus aureus) 10/2010   . Diabetes mellitus   . Hyperlipemia   . Cancer     skin    Past Surgical History  Procedure Date  . Shoulder surgery   . Sinus surgery with instatrak   . Elbow surgery 2012    elbow MRSA infection     Family History  Problem Relation Age of Onset  . Colon polyps Father   . Heart disease Father   . Stroke Father   . Heart disease Mother   . Colon cancer Neg Hx     History  Substance Use Topics  . Smoking status: Never Smoker   . Smokeless tobacco: Never Used  . Alcohol Use: No      Review of Systems  Constitutional: Negative for fever and chills.  HENT: Positive for facial swelling. Negative for neck pain and neck stiffness.   Eyes: Positive for redness.  Respiratory: Positive for shortness of breath and wheezing. Negative for cough.   Cardiovascular: Negative for chest pain and palpitations.  Gastrointestinal: Negative for nausea, vomiting, abdominal pain and diarrhea.  Skin: Positive for flushing. Negative for itching and rash.  Neurological: Negative for dizziness.  Psychiatric/Behavioral: Negative for altered mental status.    Allergies  Azithromycin; Dilaudid; Penicillins; and Sulfonamide derivatives  Home Medications   Current Outpatient Rx  Name Route Sig Dispense Refill  . AZITHROMYCIN 250 MG PO TABS  2 po today then 1  every day for the next 4 days 6 tablet 0  . BUDESONIDE ER 3 MG PO CP24 Oral Take 9 mg by mouth daily.    Marland Kitchen VITAMIN B 12 PO Oral Take 1 tablet by mouth daily.    Marland Kitchen DILTIAZEM HCL ER BEADS 240 MG PO CP24 Oral Take 240 mg by mouth daily.    Marland Kitchen ESOMEPRAZOLE MAGNESIUM 40 MG PO CPDR Oral Take 40 mg by mouth daily before breakfast.    . FOLIC ACID 1 MG PO TABS Oral Take 1 mg by mouth daily.     Marland Kitchen HYDROCODONE-HOMATROPINE 5-1.5 MG/5ML PO SYRP Oral Take 5 mLs by mouth every 8 (eight) hours as needed.  For cough    . HYDROCORTISONE ACETATE 25 MG RE SUPP Rectal Place 25 mg rectally daily as needed. 1 SUPPOSITORY AT BEDTIME FOR 1 WEEK THEN AS NEEDED. FOR HEMORROIDS    . MERCAPTOPURINE 50 MG PO TABS Oral Take 100 mg by mouth daily. Give on an empty stomach 1 hour before or 2 hours after meals. Caution: Chemotherapy.    Marland Kitchen MESALAMINE 400 MG PO TBEC Oral Take 800 mg by mouth 3 (three) times daily.    Marland Kitchen METFORMIN HCL 500 MG PO TABS Oral Take 500 mg by mouth 2 (two) times daily with a meal.    . METHSCOPOLAMINE BROMIDE 5 MG PO TABS Oral Take 5 mg by mouth 2 (two) times daily.    Marland Kitchen POTASSIUM CHLORIDE CRYS ER 20 MEQ PO TBCR Oral Take 20 mEq by mouth 2 (two) times daily.    . TERIPARATIDE (RECOMBINANT) 600 MCG/2.4ML [place] SOLN Subcutaneous Inject 20 mcg into the skin daily.    Marland Kitchen VALSARTAN 320 MG PO TABS Oral Take 320 mg by mouth daily.    Marland Kitchen CALCIUM PO Oral Take 1 tablet by mouth 3 (three) times daily.     Marland Kitchen DAIRY-RELIEF PO Oral Take 3 tablets by mouth 3 (three) times daily.     Marland Kitchen METRONIDAZOLE 500 MG PO TABS      . FLORASTOR PO Oral Take 1 tablet by mouth daily.       BP 127/101  Pulse 83  Temp 98.1 F (36.7 C)  Resp 20  Ht 5' 11"  (1.803 m)  Wt 255 lb (115.667 kg)  BMI 35.57 kg/m2  SpO2 97%  Physical Exam  Nursing note and vitals reviewed. Constitutional: He is oriented to person, place, and time. He appears well-developed and well-nourished. No distress.  HENT:  Head: Normocephalic and atraumatic.  Mouth/Throat: Oropharynx is clear and moist and mucous membranes are normal.       No sign of airway obstruction.   Uvula midline.  No swelling of lips, tongue, or uvula.  No drooling.  Tongue not elevated. No trismus.  Eyes:       Moderate edema of upper eyelids bilaterally.  Neck: Trachea normal, normal range of motion and full passive range of motion without pain. Neck supple. Carotid bruit is not present. No tracheal deviation present.       No stridor  Cardiovascular: Normal rate, regular  rhythm, intact distal pulses and normal pulses.        Not tachycardic  Pulmonary/Chest: Effort normal. No stridor.  Musculoskeletal: Normal range of motion.  Neurological: He is alert and oriented to person, place, and time.  Skin: Skin is warm and intact. No rash noted. Rash is not urticarial. He is not diaphoretic.       Not diaphoretic.   Psychiatric: He has a normal  mood and affect. His behavior is normal.    ED Course  Procedures (including critical care time)  Labs Reviewed - No data to display No results found.   No diagnosis found.  11:00 AM Reassessed patient.  Patient is not in any acute distress.  Patient reports that his symptoms have continued to improve since arrival in the ED. 11:55 AM Reassessed patient.  Patient is not in any acute distress.  Patient reports that his symptoms are mildly improving.  Lungs CTAB.  Patient requesting more Benadryl.  Will order another 70m Benadryl and Pepcid. 12:37 PM Reassessed patient.  Patient reports that his symptoms have improved significantly.  He denies any swelling in his throat at this time.  He reports that he is able to swallow without difficulty.  He denies any shortness of breath or difficulty breathing.  Swelling of upper eyelids significantly decreased.    MDM  Patient re-evaluated prior to dc, is hemodynamically stable, in no respiratory distress, and denies the feeling of throat closing. Pt has been advised to take OTC benadryl & return to the ED if he has a mod-severe allergic rxn (s/s including throat closing, difficulty breathing, swelling of lips face or tongue). Pt is to follow up with his PCP. Pt is agreeable with plan & verbalizes understanding.  Patient instructed to stop taking the Azithromycin and cough suppressant.  Pt given Rx for Doxycycline.        [place] PA-C 08/16/11 1322

## 2011-08-16 NOTE — ED Notes (Signed)
Pt to urgent care yesterday given zithromax for sinus infection and pneumonia, today has had facial itching and edema. Back to urgent care and sent here. No sob now. Given benadryl 62m im and solumedrol 125 mg iv and zantac 300 mg po prior to arrival to er.

## 2011-08-17 NOTE — Telephone Encounter (Signed)
Spoke with patient, he states he is doing ok.  Sugar this am was 260 and patient has not checked it since. ER did not discuss his BS, and pt does feel that it is high now.  Unable to check now due to being at a friends house.  Advised per Ryan/Sarah that pt go home and check sugar now.  If elevated, take insulin as instructed by Dr. Darron Doom.  Pt needs to recheck in am to control DM better.

## 2011-08-18 ENCOUNTER — Ambulatory Visit: Payer: 59 | Admitting: Family Medicine

## 2011-08-18 ENCOUNTER — Encounter: Payer: Self-pay | Admitting: Family Medicine

## 2011-08-18 VITALS — BP 132/81 | HR 88 | Temp 98.5°F | Resp 20 | Ht 71.0 in | Wt 261.0 lb

## 2011-08-18 DIAGNOSIS — E119 Type 2 diabetes mellitus without complications: Secondary | ICD-10-CM

## 2011-08-18 DIAGNOSIS — E1065 Type 1 diabetes mellitus with hyperglycemia: Secondary | ICD-10-CM

## 2011-08-18 MED ORDER — INSULIN GLARGINE 100 UNIT/ML ~~LOC~~ SOLN: 25.0000 [IU] | Freq: Every day | SUBCUTANEOUS | Status: DC

## 2011-08-18 NOTE — Progress Notes (Signed)
Is a 51 year old gentleman with multiple medical problems including inflammatory bowel disease was had a recent pneumonia followed by a allergic reaction to azithromycin resulting in urgent trip to the emergency room this past week and subsequent uncontrolled blood sugar. He comes in today because the blood sugar over the past 24 hours remained high. Blood sugar this morning was still over 200 despite taking Lantus last night [place] had an over 300. He's felt dehydrated with a dry mouth and dizziness. His pneumonia has been coming under control with doxycycline and the acute facial swelling from the allergic reaction has been improving after a trip to the emergency room with Solu-Medrol.  Objective: No acute distress this morning but face is still oropharynx good airway  Chest: Clear with coarse breath sounds her  Assessment: Resolving allergic reaction and pneumonia. Diabetes, uncontrolled. Unconcerned about his dehydration situation at the present time.  Plan: Continue the doxycycline. Stop the metformin. Start Lantus 25 units daily. Continue to monitor blood sugar.:: Let sugar readings daily

## 2011-08-19 NOTE — ED Provider Notes (Signed)
Medical screening examination/treatment/procedure(s) were conducted as a shared visit with non-physician practitioner(s) and myself.  I personally evaluated the patient during the encounter.  Pt with facial swelling. Tx'd prior to arrival for solumedrol, benadryl and ranitidine. Sates that on arrival in ED feels like symptoms have been improving. No new exposures that he is aware of. Recently started taking azithromycin but has taken multiple times previously with incident. Observed in ED. No worsening of symptoms during stay and actually improving. No respiratory distress. HD stable. Stirct return precautiosn discussed.  Virgel Manifold, MD 08/19/11 [phone]

## 2011-08-27 ENCOUNTER — Encounter (HOSPITAL_COMMUNITY): Payer: Self-pay | Admitting: Emergency Medicine

## 2011-08-27 ENCOUNTER — Emergency Department (HOSPITAL_COMMUNITY)
Admission: EM | Admit: 2011-08-27 | Discharge: 2011-08-27 | Disposition: A | Discharge status: Home / Self Care | Payer: 59 | Attending: Emergency Medicine | Admitting: Emergency Medicine

## 2011-08-27 ENCOUNTER — Emergency Department (HOSPITAL_COMMUNITY): Payer: 59

## 2011-08-27 DIAGNOSIS — R110 Nausea: Secondary | ICD-10-CM | POA: Insufficient documentation

## 2011-08-27 DIAGNOSIS — Z79899 Other long term (current) drug therapy: Secondary | ICD-10-CM | POA: Insufficient documentation

## 2011-08-27 DIAGNOSIS — K5090 Crohn's disease, unspecified, without complications: Secondary | ICD-10-CM | POA: Insufficient documentation

## 2011-08-27 DIAGNOSIS — Z8614 Personal history of Methicillin resistant Staphylococcus aureus infection: Secondary | ICD-10-CM | POA: Insufficient documentation

## 2011-08-27 DIAGNOSIS — N201 Calculus of ureter: Secondary | ICD-10-CM | POA: Insufficient documentation

## 2011-08-27 DIAGNOSIS — M549 Dorsalgia, unspecified: Secondary | ICD-10-CM | POA: Insufficient documentation

## 2011-08-27 DIAGNOSIS — Z8701 Personal history of pneumonia (recurrent): Secondary | ICD-10-CM | POA: Insufficient documentation

## 2011-08-27 DIAGNOSIS — Z860100 Personal history of colon polyps, unspecified: Secondary | ICD-10-CM | POA: Insufficient documentation

## 2011-08-27 DIAGNOSIS — K219 Gastro-esophageal reflux disease without esophagitis: Secondary | ICD-10-CM | POA: Insufficient documentation

## 2011-08-27 DIAGNOSIS — Z794 Long term (current) use of insulin: Secondary | ICD-10-CM | POA: Insufficient documentation

## 2011-08-27 DIAGNOSIS — R109 Unspecified abdominal pain: Secondary | ICD-10-CM | POA: Insufficient documentation

## 2011-08-27 DIAGNOSIS — Z85828 Personal history of other malignant neoplasm of skin: Secondary | ICD-10-CM | POA: Insufficient documentation

## 2011-08-27 DIAGNOSIS — N200 Calculus of kidney: Secondary | ICD-10-CM

## 2011-08-27 DIAGNOSIS — M810 Age-related osteoporosis without current pathological fracture: Secondary | ICD-10-CM | POA: Insufficient documentation

## 2011-08-27 DIAGNOSIS — E785 Hyperlipidemia, unspecified: Secondary | ICD-10-CM | POA: Insufficient documentation

## 2011-08-27 DIAGNOSIS — I10 Essential (primary) hypertension: Secondary | ICD-10-CM | POA: Insufficient documentation

## 2011-08-27 DIAGNOSIS — E119 Type 2 diabetes mellitus without complications: Secondary | ICD-10-CM | POA: Insufficient documentation

## 2011-08-27 DIAGNOSIS — R1013 Epigastric pain: Secondary | ICD-10-CM | POA: Insufficient documentation

## 2011-08-27 HISTORY — DX: Sleep apnea, unspecified: G47.30

## 2011-08-27 LAB — CBC
MCV: 97.2 fL (ref 78.0–100.0)
Platelets: 212 10*3/uL (ref 150–400)
RDW: 14.7 % (ref 11.5–15.5)
WBC: 12.9 10*3/uL — ABNORMAL HIGH (ref 4.0–10.5)

## 2011-08-27 LAB — URINALYSIS, ROUTINE W REFLEX MICROSCOPIC
Glucose, UA: 500 mg/dL — AB
Hgb urine dipstick: NEGATIVE
Leukocytes, UA: NEGATIVE
Protein, ur: NEGATIVE mg/dL
Specific Gravity, Urine: 1.027 (ref 1.005–1.030)
pH: 6 (ref 5.0–8.0)

## 2011-08-27 LAB — COMPREHENSIVE METABOLIC PANEL
ALT: 76 U/L — ABNORMAL HIGH (ref 0–53)
AST: 39 U/L — ABNORMAL HIGH (ref 0–37)
Albumin: 3.3 g/dL — ABNORMAL LOW (ref 3.5–5.2)
CO2: 23 mEq/L (ref 19–32)
Calcium: 9.6 mg/dL (ref 8.4–10.5)
GFR calc non Af Amer: >90 mL/min (ref >90)
Sodium: 138 mEq/L (ref 135–145)

## 2011-08-27 LAB — DIFFERENTIAL
Basophils Absolute: 0 10*3/uL (ref 0.0–0.1)
Basophils Relative: 0 % (ref 0–1)
Eosinophils Relative: 1 % (ref 0–5)
Lymphocytes Relative: 9 % — ABNORMAL LOW (ref 12–46)
Neutro Abs: 10.5 10*3/uL — ABNORMAL HIGH (ref 1.7–7.7)

## 2011-08-27 MED ORDER — ONDANSETRON HCL 4 MG PO TABS: 4.0000 mg | ORAL_TABLET | Freq: Four times a day (QID) | ORAL | Status: AC

## 2011-08-27 MED ORDER — MORPHINE SULFATE 4 MG/ML IJ SOLN
8.0000 mg | Freq: Once | INTRAMUSCULAR | Status: AC
  Administered 2011-08-27: 8 mg via INTRAVENOUS
  Filled 2011-08-27: qty 2

## 2011-08-27 MED ORDER — OXYCODONE-ACETAMINOPHEN 5-325 MG PO TABS: 1.0000 | ORAL_TABLET | ORAL | Status: AC | PRN

## 2011-08-27 MED ORDER — TAMSULOSIN HCL 0.4 MG PO CAPS: 0.4000 mg | ORAL_CAPSULE | Freq: Every day | ORAL | Status: DC

## 2011-08-27 MED ORDER — SODIUM CHLORIDE 0.9 % IV SOLN
Freq: Once | INTRAVENOUS | Status: AC
  Administered 2011-08-27: 14:00:00 via INTRAVENOUS

## 2011-08-27 MED ORDER — ONDANSETRON HCL 4 MG/2ML IJ SOLN
4.0000 mg | Freq: Once | INTRAMUSCULAR | Status: AC
  Administered 2011-08-27: 4 mg via INTRAVENOUS
  Filled 2011-08-27: qty 2

## 2011-08-27 NOTE — ED Provider Notes (Signed)
Medical screening examination/treatment/procedure(s) were conducted as a shared visit with non-physician practitioner(s) and myself.  I personally evaluated the patient during the encounter   [name] Octave, MD 08/27/11 2005

## 2011-08-27 NOTE — ED Notes (Signed)
Back pain that rads to front started sunday

## 2011-08-27 NOTE — Discharge Instructions (Signed)
Kidney Stones Kidney stones (ureteral lithiasis) are deposits that form inside your kidneys. The intense pain is caused by the stone moving through the urinary tract. When the stone moves, the ureter goes into spasm around the stone. The stone is usually passed in the urine.  CAUSES   A disorder that makes certain neck glands produce too much parathyroid hormone (primary hyperparathyroidism).   A buildup of uric acid crystals.   Narrowing (stricture) of the ureter.   A kidney obstruction present at birth (congenital obstruction).   Previous surgery on the kidney or ureters.   Numerous kidney infections.  SYMPTOMS   Feeling sick to your stomach (nauseous).   Throwing up (vomiting).   Blood in the urine (hematuria).   Pain that usually spreads (radiates) to the groin.   Frequency or urgency of urination.  DIAGNOSIS   Taking a history and physical exam.   Blood or urine tests.   Computerized X-ray scan (CT scan).   Occasionally, an examination of the inside of the urinary bladder (cystoscopy) is performed.  TREATMENT   Observation.   Increasing your fluid intake.   Surgery may be needed if you have severe pain or persistent obstruction.  The size, location, and chemical composition are all important variables that will determine the proper choice of action for you. Talk to your caregiver to better understand your situation so that you will minimize the risk of injury to yourself and your kidney.  HOME CARE INSTRUCTIONS   Drink enough water and fluids to keep your urine clear or pale yellow.   Strain all urine through the provided strainer. Keep all particulate matter and stones for your caregiver to see. The stone causing the pain may be as small as a grain of salt. It is very important to use the strainer each and every time you pass your urine. The collection of your stone will allow your caregiver to analyze it and verify that a stone has actually passed.   Only take  over-the-counter or prescription medicines for pain, discomfort, or fever as directed by your caregiver.   Make a follow-up appointment with your caregiver as directed.   Get follow-up X-rays if required. The absence of pain does not always mean that the stone has passed. It may have only stopped moving. If the urine remains completely obstructed, it can cause loss of kidney function or even complete destruction of the kidney. It is your responsibility to make sure X-rays and follow-ups are completed. Ultrasounds of the kidney can show blockages and the status of the kidney. Ultrasounds are not associated with any radiation and can be performed easily in a matter of minutes.  SEEK IMMEDIATE MEDICAL CARE IF:   Pain cannot be controlled with the prescribed medicine.   You have a fever.   The severity or intensity of pain increases over 18 hours and is not relieved by pain medicine.   You develop a new onset of abdominal pain.   You feel faint or pass out.  MAKE SURE YOU:   Understand these instructions.   Will watch your condition.   Will get help right away if you are not doing well or get worse.  Document Released: 06/10/2005 Document Revised: 05/30/2011 Document Reviewed: 10/06/2009 [facility] Patient Information 2012 [place].

## 2011-08-27 NOTE — ED Provider Notes (Signed)
Patient in CDU awaiting completion of diagnostic testing in evaluation of flank pain.  Non-contrasted CT reveals non-obstructive 2 mm stone in right ureter.  Pain with history of kidney stones on the left.  Followed by Dr. Karsten Ro.  Results discussed with patient and with Dr. Thad Ranger.  Patient's pain improved with Morphine.  Patient to receive RX for flomax, zofran, percocet.  Patient to follow-up with his urologist.  Norman Herrlich, NP 08/27/11 1718

## 2011-08-27 NOTE — ED Provider Notes (Signed)
Medical screening examination/treatment/procedure(s) were conducted as a shared visit with non-physician practitioner(s) and myself.  I personally evaluated the patient during the encounter   Lezlie Octave, MD 08/27/11 2005

## 2011-08-27 NOTE — ED Provider Notes (Signed)
History     CSN: [id number]  Arrival date & time 08/27/11  1118   First MD Initiated Contact with Patient 08/27/11 1328      Chief Complaint  Patient presents with  . Back Pain    (Consider location/radiation/quality/duration/timing/severity/associated sxs/prior treatment) HPI Comments: Patient presents complaining of right flank pain that started this morning.  Patient notes that over the weekend he had some intermittent very mild pain that was not bothersome.  This morning the pain was initially added to that is gradually gotten worse to the morning and early afternoon.  He did note that it got slightly worse with eating yogurt for breakfast this morning.  He has mild nausea but no vomiting.  Patient has no changes in his bowel habits.  He has not noted any hematuria or dysuria.  On the pain is in his right flank and does radiate around to his right upper quadrant and his right side.  He is uncertain if it is like past kidney stones that he has had.  He has not had this pain before and denies any trauma or injury.  Patient is a 51 y.o. male presenting with back pain. The history is provided by the patient. No language interpreter was used.  Back Pain  This is a new problem. The current episode started 6 to 12 hours ago. The problem occurs constantly. The problem has been gradually worsening. The pain is associated with no known injury. The pain is severe. The symptoms are aggravated by certain positions. Pertinent negatives include no chest pain, no fever, no numbness, no weight loss, no headaches, no abdominal pain, no abdominal swelling, no bowel incontinence, no perianal numbness, no bladder incontinence, no dysuria, no pelvic pain, no leg pain, no paresthesias, no paresis and no tingling. He has tried nothing for the symptoms.    Past Medical History  Diagnosis Date  . Hypertension   . GERD (gastroesophageal reflux disease)   . Adenomatous colon polyp 10/1999  . Osteoporosis   .  External hemorrhoids   . Crohn's disease of small and large intestines 1999  . Pneumonia   . Esophageal stricture   . MRSA (methicillin resistant Staphylococcus aureus) 10/2010   . Diabetes mellitus   . Hyperlipemia   . Cancer     skin    Past Surgical History  Procedure Date  . Shoulder surgery   . Sinus surgery with instatrak   . Elbow surgery 2012    elbow MRSA infection     Family History  Problem Relation Age of Onset  . Colon polyps Father   . Heart disease Father   . Stroke Father   . Heart disease Mother   . Colon cancer Neg Hx     History  Substance Use Topics  . Smoking status: Never Smoker   . Smokeless tobacco: Never Used  . Alcohol Use: No      Review of Systems  Constitutional: Negative.  Negative for fever, chills and weight loss.  HENT: Negative.   Eyes: Negative.  Negative for discharge and redness.  Respiratory: Negative.  Negative for cough and shortness of breath.   Cardiovascular: Negative.  Negative for chest pain.  Gastrointestinal: Positive for nausea. Negative for vomiting, abdominal pain and bowel incontinence.  Genitourinary: Negative.  Negative for bladder incontinence, dysuria, hematuria and pelvic pain.  Musculoskeletal: Positive for back pain.  Skin: Negative.  Negative for color change and rash.  Neurological: Negative for tingling, syncope, numbness, headaches and paresthesias.  Hematological:  Negative.  Negative for adenopathy.  Psychiatric/Behavioral: Negative.  Negative for confusion.  All other systems reviewed and are negative.    Allergies  Azithromycin; Dilaudid; Penicillins; and Sulfonamide derivatives  Home Medications   Current Outpatient Rx  Name Route Sig Dispense Refill  . BUDESONIDE ER 3 MG PO CP24 Oral Take 9 mg by mouth daily.    Marland Kitchen CALCIUM PO Oral Take 1 tablet by mouth 3 (three) times daily.     Marland Kitchen VITAMIN B 12 PO Oral Take 1 tablet by mouth daily.    Marland Kitchen DILTIAZEM HCL ER BEADS 240 MG PO CP24 Oral Take 240  mg by mouth daily.    Marland Kitchen ESOMEPRAZOLE MAGNESIUM 40 MG PO CPDR Oral Take 40 mg by mouth daily before breakfast.    . FOLIC ACID 1 MG PO TABS Oral Take 1 mg by mouth daily.     Marland Kitchen HYDROCODONE-HOMATROPINE 5-1.5 MG/5ML PO SYRP Oral Take 5 mLs by mouth every 8 (eight) hours as needed. For cough    . HYDROCORTISONE ACETATE 25 MG RE SUPP Rectal Place 25 mg rectally daily as needed. FOR HEMORROIDS    . INSULIN GLARGINE 100 UNIT/ML [place] SOLN Subcutaneous Inject 25 Units into the skin at bedtime.    Marland Kitchen DAIRY-RELIEF PO Oral Take 3 tablets by mouth 3 (three) times daily.     . MERCAPTOPURINE 50 MG PO TABS Oral Take 100 mg by mouth daily. Give on an empty stomach 1 hour before or 2 hours after meals. Caution: Chemotherapy.    Marland Kitchen MESALAMINE 400 MG PO TBEC Oral Take 800 mg by mouth 3 (three) times daily.    Marland Kitchen METHSCOPOLAMINE BROMIDE 5 MG PO TABS Oral Take 5 mg by mouth 2 (two) times daily.    Marland Kitchen METRONIDAZOLE 500 MG PO TABS      . POTASSIUM CHLORIDE CRYS ER 20 MEQ PO TBCR Oral Take 20 mEq by mouth 2 (two) times daily.    Marland Kitchen FLORASTOR PO Oral Take 1 tablet by mouth daily.     . TERIPARATIDE (RECOMBINANT) 600 MCG/2.4ML [place] SOLN Subcutaneous Inject 20 mcg into the skin daily.    Marland Kitchen VALSARTAN 320 MG PO TABS Oral Take 320 mg by mouth daily.      BP 149/89  Pulse 99  Temp 98.2 F (36.8 C)  Resp 20  SpO2 99%  Physical Exam  Nursing note and vitals reviewed. Constitutional: He is oriented to person, place, and time. He appears well-developed and well-nourished.  Non-toxic appearance. He does not have a sickly appearance.  HENT:  Head: Normocephalic and atraumatic.  Eyes: Conjunctivae, EOM and lids are normal. Pupils are equal, round, and reactive to light.  Neck: Trachea normal, normal range of motion and full passive range of motion without pain. Neck supple.  Cardiovascular: Normal rate, regular rhythm and normal heart sounds.   Pulmonary/Chest: Effort normal and breath sounds normal. No respiratory distress. He  has no wheezes. He has no rales.  Abdominal: Soft. Normal appearance. He exhibits no distension. There is no tenderness. There is no rebound and no CVA tenderness.  Musculoskeletal: Normal range of motion.  Neurological: He is alert and oriented to person, place, and time. He has normal strength.  Skin: Skin is warm, dry and intact. No rash noted.  Psychiatric: He has a normal mood and affect. His behavior is normal. Judgment and thought content normal.    ED Course  Procedures (including critical care time)   Labs Reviewed  CBC  DIFFERENTIAL  COMPREHENSIVE METABOLIC  PANEL  LIPASE, BLOOD  URINALYSIS, ROUTINE W REFLEX MICROSCOPIC   No results found.   No diagnosis found.    MDM  Patient to be transferred over to CDU for holding while awaiting his laboratory studies and urinalysis as my primary considerations for my differential at this time or cholecystitis versus kidney stone.  Once obtaining the laboratory studies we will better be able to determine if the patient should have a CT abdomen pelvis versus a right upper quadrant ultrasound performed.        Lezlie Octave, MD 08/27/11 1343

## 2011-08-27 NOTE — ED Provider Notes (Signed)
Patient with a hx sig for hypertension, diabetes, and Crohn's was placed in CDU  for labs pending 2 evaluate right-sided flank pain, epigastric pain, and right upper quadrant pain.  This patient is not on protocol.  Patient care resumed from Dr. Thad Ranger, stretcher triage. Patient is here for labs pending and has received pain control with morphine. Patient re-evaluated and is resting comfortable, VSS, with no new complaints or concerns at this time. Plan per previous provider is once labs result r/o GB vs kidney stone. On exam: hemodynamically stable, NAD, heart w/ RRR, lungs CTAB, Chest & abd non-tender, no peripheral edema or calf tenderness.   BP 131/88  Pulse 99  Temp 98.2 F (36.8 C)  Resp 20  Ht 5' 10"  (1.778 m)  Wt 250 lb (113.399 kg)  BMI 35.87 kg/m2  SpO2 98%   CBC    Component Value Date/Time   WBC 12.9* 08/27/2011 1338   WBC 7.7 08/16/2011 0835   RBC 4.35 08/27/2011 1338   RBC 4.40* 08/16/2011 0835   HGB 14.6 08/27/2011 1338   HGB 14.0* 08/16/2011 0835   HCT 42.3 08/27/2011 1338   HCT 43.5 08/16/2011 0835   PLT 212 08/27/2011 1338   MCV 97.2 08/27/2011 1338   MCV 98.9* 08/16/2011 0835   MCH 33.6 08/27/2011 1338   MCH 31.8* 08/16/2011 0835   MCHC 34.5 08/27/2011 1338   MCHC 32.2 08/16/2011 0835   RDW 14.7 08/27/2011 1338   LYMPHSABS 1.2 08/27/2011 1338   MONOABS 1.1* 08/27/2011 1338   EOSABS 0.1 08/27/2011 1338   BASOSABS 0.0 08/27/2011 1338   CMP     Component Value Date/Time   NA 138 08/27/2011 1338   K 3.7 08/27/2011 1338   CL 105 08/27/2011 1338   CO2 23 08/27/2011 1338   GLUCOSE 178* 08/27/2011 1338   BUN 15 08/27/2011 1338   CREATININE 0.70 08/27/2011 1338   CREATININE 0.91 08/06/2011 1621   CALCIUM 9.6 08/27/2011 1338   PROT 7.0 08/27/2011 1338   ALBUMIN 3.3* 08/27/2011 1338   AST 39* 08/27/2011 1338   ALT 76* 08/27/2011 1338   ALKPHOS 87 08/27/2011 1338   BILITOT 1.0 08/27/2011 1338   GFRNONAA >90 08/27/2011 1338   GFRAA >90 08/27/2011 1338   2:49 PM Patient's labs resulted and do not point strongly to kid  stone or GB. Non con CT ordered.  3:26 PM   Pt care handed off to Etta Quill. Plan is to cont treating pts pain in ED w morphine, If CT is neg for stone order Abd [place] to r/o biliary etiology. Pt in NAD VSS prior to hand off.     Verl Dicker, [place] 08/27/11 1526

## 2011-08-27 NOTE — ED Notes (Signed)
Patient escorted out to vehicle via wheelchair.

## 2011-09-05 ENCOUNTER — Other Ambulatory Visit: Payer: Self-pay | Admitting: *Deleted

## 2011-09-05 MED ORDER — ONETOUCH ULTRASOFT LANCETS MISC: Status: DC

## 2011-09-05 MED ORDER — GLUCOSE BLOOD VI STRP: ORAL_STRIP | Status: DC

## 2011-09-15 ENCOUNTER — Ambulatory Visit (INDEPENDENT_AMBULATORY_CARE_PROVIDER_SITE_OTHER): Payer: 59 | Admitting: Family Medicine

## 2011-09-15 ENCOUNTER — Encounter: Payer: Self-pay | Admitting: Family Medicine

## 2011-09-15 VITALS — BP 118/83 | HR 97 | Temp 98.1°F | Resp 16 | Ht 70.0 in | Wt 239.0 lb

## 2011-09-15 DIAGNOSIS — E109 Type 1 diabetes mellitus without complications: Secondary | ICD-10-CM

## 2011-09-15 DIAGNOSIS — L570 Actinic keratosis: Secondary | ICD-10-CM

## 2011-09-15 DIAGNOSIS — H16209 Unspecified keratoconjunctivitis, unspecified eye: Secondary | ICD-10-CM

## 2011-09-15 DIAGNOSIS — M7660 Achilles tendinitis, unspecified leg: Secondary | ICD-10-CM

## 2011-09-15 DIAGNOSIS — E1065 Type 1 diabetes mellitus with hyperglycemia: Secondary | ICD-10-CM

## 2011-09-15 MED ORDER — INSULIN GLARGINE 100 UNIT/ML ~~LOC~~ SOLN: 40.0000 [IU] | Freq: Every day | SUBCUTANEOUS | Status: DC

## 2011-09-15 NOTE — Patient Instructions (Signed)
Achilles Tendinitis Tendinitis a swelling and soreness of the tendon. The pain in the tendon (cord-like structure which attaches muscle to bone) is produced by tiny tears and the inflammation present in that tendon. It commonly occurs at the shoulders, heels, and elbows. It is usually caused by overusing the tendon and joint involved. Achilles tendinitis involves the Achilles tendon. This is the large tendon in the back of the leg just above the foot. It attaches the large muscles of the lower leg to the heel bone (called calcaneus).  This diagnosis (learning what is wrong) is made by examination. X-rays will be generally be normal if only tendinitis is present. HOME CARE INSTRUCTIONS   Apply ice to the injury for 15 to 20 minutes, 3 to 4 times per day. Put the ice in a plastic bag and place a towel between the bag of ice and your skin.   Try to avoid use other than gentle range of motion while the tendon is painful. Do not resume use until instructed by your caregiver. Then begin use gradually. Do not increase use to the point of pain. If pain does develop, decrease use and continue the above measures. Gradually increase activities that do not cause discomfort until you gradually achieve normal use.   Only take over-the-counter or prescription medicines for pain, discomfort, or fever as directed by your caregiver.  SEEK MEDICAL CARE IF:   Your pain and swelling increase or pain is uncontrolled with medications.   You develop new, unexplained problems (symptoms) or an increase of the symptoms that brought you to your caregiver.   You develop an inability to move your toes or foot, develop warmth and swelling in your foot, or begin running an unexplained temperature.  MAKE SURE YOU:   Understand these instructions.   Will watch your condition.   Will get help right away if you are not doing well or get worse.  Document Released: 03/20/2005 Document Revised: 05/30/2011 Document Reviewed:  01/27/2008 [facility] Patient Information 2012 [place].

## 2011-09-15 NOTE — Progress Notes (Signed)
51 year old diabetic with cough [facility] comes in with 3 complaints: Uncontrolled type 1 diabetes, left Achilles tendinitis, and right cheek lesion. The diabetes has not been controlled for quite a while with wildly fluctuating blood sugars. He brings in a diary of his blood sugars which range from 168 2/400. He's taking his Lantus twice a day using 25 units in the morning and anywhere from 0-25 units at night. He's had several months of a progressive scaly heart rate she did not go. Finally he's had progressive right Achilles pain radiating into the medial malleolus which is causing him to limp.  Objective cultures reviewed  Tender left Achilles without swelling at the insertion  3 mm scaly right cheek nodule which was treated with liquid nitrogen  Assessment uncontrolled type 1 diabetes which will be treated with 40 units of Lantus daily and follow up with twice a day Accu-Cheks. He  Achilles tendinitis treated with left, ice, Aspercreme  Therapeutic cheek which are being followed in one month 1

## 2011-09-20 ENCOUNTER — Telehealth: Payer: Self-pay

## 2011-09-20 NOTE — Telephone Encounter (Signed)
Dr. Joseph Art- patient was told to call you back with his blood sugar. Over the past week, even with increased insulin, it is running between 250-400.  He is not consuming any soda, sugars or anything.  Please call him to advise what he should do

## 2011-09-25 ENCOUNTER — Other Ambulatory Visit: Payer: Self-pay

## 2011-09-25 ENCOUNTER — Emergency Department (HOSPITAL_COMMUNITY): Payer: 59

## 2011-09-25 ENCOUNTER — Encounter (HOSPITAL_COMMUNITY): Payer: Self-pay | Admitting: Emergency Medicine

## 2011-09-25 ENCOUNTER — Inpatient Hospital Stay (HOSPITAL_COMMUNITY)
Admission: EM | Admit: 2011-09-25 | Discharge: 2011-09-27 | DRG: 948 | Disposition: A | Discharge status: Home Health | Payer: 59 | Source: Ambulatory Visit | Attending: Family Medicine | Admitting: Family Medicine

## 2011-09-25 ENCOUNTER — Other Ambulatory Visit: Payer: Self-pay | Admitting: Gastroenterology

## 2011-09-25 DIAGNOSIS — Z8614 Personal history of Methicillin resistant Staphylococcus aureus infection: Secondary | ICD-10-CM

## 2011-09-25 DIAGNOSIS — Z794 Long term (current) use of insulin: Secondary | ICD-10-CM

## 2011-09-25 DIAGNOSIS — R4182 Altered mental status, unspecified: Principal | ICD-10-CM

## 2011-09-25 DIAGNOSIS — R413 Other amnesia: Secondary | ICD-10-CM

## 2011-09-25 DIAGNOSIS — M810 Age-related osteoporosis without current pathological fracture: Secondary | ICD-10-CM | POA: Diagnosis present

## 2011-09-25 DIAGNOSIS — K5090 Crohn's disease, unspecified, without complications: Secondary | ICD-10-CM | POA: Diagnosis present

## 2011-09-25 DIAGNOSIS — K219 Gastro-esophageal reflux disease without esophagitis: Secondary | ICD-10-CM | POA: Diagnosis present

## 2011-09-25 DIAGNOSIS — Z79899 Other long term (current) drug therapy: Secondary | ICD-10-CM

## 2011-09-25 DIAGNOSIS — E119 Type 2 diabetes mellitus without complications: Secondary | ICD-10-CM

## 2011-09-25 DIAGNOSIS — R410 Disorientation, unspecified: Secondary | ICD-10-CM

## 2011-09-25 DIAGNOSIS — R42 Dizziness and giddiness: Secondary | ICD-10-CM

## 2011-09-25 DIAGNOSIS — G4730 Sleep apnea, unspecified: Secondary | ICD-10-CM | POA: Diagnosis present

## 2011-09-25 DIAGNOSIS — E785 Hyperlipidemia, unspecified: Secondary | ICD-10-CM | POA: Diagnosis present

## 2011-09-25 DIAGNOSIS — I10 Essential (primary) hypertension: Secondary | ICD-10-CM | POA: Diagnosis present

## 2011-09-25 DIAGNOSIS — R7402 Elevation of levels of lactic acid dehydrogenase (LDH): Secondary | ICD-10-CM | POA: Diagnosis present

## 2011-09-25 DIAGNOSIS — Z85828 Personal history of other malignant neoplasm of skin: Secondary | ICD-10-CM

## 2011-09-25 DIAGNOSIS — IMO0001 Reserved for inherently not codable concepts without codable children: Secondary | ICD-10-CM | POA: Diagnosis present

## 2011-09-25 DIAGNOSIS — K5080 Crohn's disease of both small and large intestine without complications: Secondary | ICD-10-CM | POA: Diagnosis present

## 2011-09-25 DIAGNOSIS — I517 Cardiomegaly: Secondary | ICD-10-CM

## 2011-09-25 HISTORY — DX: Other specified postprocedural states: R11.2

## 2011-09-25 HISTORY — DX: Other specified postprocedural states: Z98.890

## 2011-09-25 LAB — GLUCOSE, CAPILLARY
Glucose-Capillary: 179 mg/dL — ABNORMAL HIGH (ref 70–99)
Glucose-Capillary: 98 mg/dL (ref 70–99)
Glucose-Capillary: 100 mg/dL — ABNORMAL HIGH (ref 70–99)

## 2011-09-25 LAB — BASIC METABOLIC PANEL
CO2: 26 mEq/L (ref 19–32)
Chloride: 102 mEq/L (ref 96–112)
Creatinine, Ser: 0.86 mg/dL (ref 0.50–1.35)
GFR calc Af Amer: >90 mL/min (ref >90)
Potassium: 3.8 mEq/L (ref 3.5–5.1)

## 2011-09-25 LAB — URINALYSIS, ROUTINE W REFLEX MICROSCOPIC
Glucose, UA: 100 mg/dL — AB
Leukocytes, UA: NEGATIVE
Nitrite: NEGATIVE
Specific Gravity, Urine: 1.031 — ABNORMAL HIGH (ref 1.005–1.030)
pH: 5.5 (ref 5.0–8.0)

## 2011-09-25 LAB — RAPID URINE DRUG SCREEN, HOSP PERFORMED
Amphetamines: NOT DETECTED
Benzodiazepines: NOT DETECTED
Cocaine: NOT DETECTED
Opiates: NOT DETECTED

## 2011-09-25 LAB — HEPATIC FUNCTION PANEL
ALT: 77 U/L — ABNORMAL HIGH (ref 0–53)
Alkaline Phosphatase: 88 U/L (ref 39–117)
Bilirubin, Direct: <0.1 mg/dL (ref 0.0–0.3)
Total Bilirubin: 0.3 mg/dL (ref 0.3–1.2)
Total Protein: 6.5 g/dL (ref 6.0–8.3)

## 2011-09-25 LAB — CBC
HCT: 42.4 % (ref 39.0–52.0)
HCT: 43 % (ref 39.0–52.0)
Hemoglobin: 14.3 g/dL (ref 13.0–17.0)
Hemoglobin: 14.2 g/dL (ref 13.0–17.0)
MCHC: 33 g/dL (ref 30.0–36.0)
MCV: 97 fL (ref 78.0–100.0)
Platelets: 232 10*3/uL (ref 150–400)
RBC: 4.42 MIL/uL (ref 4.22–5.81)
RDW: 14.5 % (ref 11.5–15.5)

## 2011-09-25 LAB — MRSA PCR SCREENING: MRSA by PCR: NEGATIVE

## 2011-09-25 LAB — CREATININE, SERUM
GFR calc Af Amer: >90 mL/min (ref >90)
GFR calc non Af Amer: >90 mL/min (ref >90)

## 2011-09-25 LAB — TSH: TSH: 1.264 u[IU]/mL (ref 0.350–4.500)

## 2011-09-25 MED ORDER — ONDANSETRON HCL 4 MG PO TABS: 4.0000 mg | ORAL_TABLET | Freq: Four times a day (QID) | ORAL | Status: DC | PRN

## 2011-09-25 MED ORDER — PANTOPRAZOLE SODIUM 40 MG PO TBEC
40.0000 mg | DELAYED_RELEASE_TABLET | Freq: Every day | ORAL | Status: DC
  Administered 2011-09-25 – 2011-09-27 (×3): 40 mg via ORAL
  Filled 2011-09-25 (×3): qty 1

## 2011-09-25 MED ORDER — MORPHINE SULFATE 2 MG/ML IJ SOLN: 2.0000 mg | INTRAMUSCULAR | Status: DC | PRN

## 2011-09-25 MED ORDER — POTASSIUM CHLORIDE CRYS ER 20 MEQ PO TBCR
20.0000 meq | EXTENDED_RELEASE_TABLET | Freq: Two times a day (BID) | ORAL | Status: DC
  Administered 2011-09-26 – 2011-09-27 (×4): 20 meq via ORAL
  Filled 2011-09-25 (×5): qty 1

## 2011-09-25 MED ORDER — HEPARIN SODIUM (PORCINE) 5000 UNIT/ML IJ SOLN
5000.0000 [IU] | Freq: Three times a day (TID) | INTRAMUSCULAR | Status: DC
  Administered 2011-09-26 – 2011-09-27 (×6): 5000 [IU] via SUBCUTANEOUS
  Filled 2011-09-25 (×8): qty 1

## 2011-09-25 MED ORDER — IRBESARTAN 300 MG PO TABS
300.0000 mg | ORAL_TABLET | Freq: Every day | ORAL | Status: DC
  Administered 2011-09-26 – 2011-09-27 (×3): 300 mg via ORAL
  Filled 2011-09-25 (×3): qty 1

## 2011-09-25 MED ORDER — METHSCOPOLAMINE BROMIDE 5 MG PO TABS
5.0000 mg | ORAL_TABLET | Freq: Two times a day (BID) | ORAL | Status: DC
  Administered 2011-09-26 – 2011-09-27 (×4): 5 mg via ORAL
  Filled 2011-09-25: qty 1

## 2011-09-25 MED ORDER — TERIPARATIDE (RECOMBINANT) 600 MCG/2.4ML ~~LOC~~ SOLN
20.0000 ug | SUBCUTANEOUS | Status: DC
  Filled 2011-09-25: qty 0.08

## 2011-09-25 MED ORDER — DIAZEPAM 5 MG/ML IJ SOLN: 2.5000 mg | Freq: Once | INTRAMUSCULAR | Status: AC | PRN

## 2011-09-25 MED ORDER — SODIUM CHLORIDE 0.9 % IV SOLN
Freq: Once | INTRAVENOUS | Status: AC
  Administered 2011-09-25: 17:00:00 via INTRAVENOUS

## 2011-09-25 MED ORDER — MESALAMINE 400 MG PO TBEC
800.0000 mg | DELAYED_RELEASE_TABLET | Freq: Three times a day (TID) | ORAL | Status: DC
  Administered 2011-09-25 – 2011-09-27 (×5): 800 mg via ORAL
  Filled 2011-09-25 (×8): qty 2

## 2011-09-25 MED ORDER — INSULIN ASPART 100 UNIT/ML ~~LOC~~ SOLN
0.0000 [IU] | Freq: Every day | SUBCUTANEOUS | Status: DC
  Administered 2011-09-26: 2 [IU] via SUBCUTANEOUS

## 2011-09-25 MED ORDER — ASPIRIN EC 81 MG PO TBEC
81.0000 mg | DELAYED_RELEASE_TABLET | Freq: Every day | ORAL | Status: DC
  Administered 2011-09-25 – 2011-09-27 (×3): 81 mg via ORAL
  Filled 2011-09-25 (×3): qty 1

## 2011-09-25 MED ORDER — ACETAMINOPHEN 325 MG PO TABS: 650.0000 mg | ORAL_TABLET | ORAL | Status: DC | PRN

## 2011-09-25 MED ORDER — BUDESONIDE 3 MG PO CP24
9.0000 mg | ORAL_CAPSULE | Freq: Every day | ORAL | Status: DC
  Administered 2011-09-25 – 2011-09-27 (×3): 9 mg via ORAL
  Filled 2011-09-25: qty 1
  Filled 2011-09-25 (×3): qty 3

## 2011-09-25 MED ORDER — INSULIN GLARGINE 100 UNIT/ML ~~LOC~~ SOLN
40.0000 [IU] | Freq: Every day | SUBCUTANEOUS | Status: DC
  Administered 2011-09-26 – 2011-09-27 (×2): 40 [IU] via SUBCUTANEOUS

## 2011-09-25 MED ORDER — MERCAPTOPURINE 50 MG PO TABS
100.0000 mg | ORAL_TABLET | Freq: Every day | ORAL | Status: DC
  Administered 2011-09-25 – 2011-09-27 (×3): 100 mg via ORAL
  Filled 2011-09-25 (×3): qty 2

## 2011-09-25 MED ORDER — SODIUM CHLORIDE 0.9 % IJ SOLN
3.0000 mL | Freq: Two times a day (BID) | INTRAMUSCULAR | Status: DC
  Administered 2011-09-26 – 2011-09-27 (×3): 3 mL via INTRAVENOUS

## 2011-09-25 MED ORDER — ONDANSETRON HCL 4 MG/2ML IJ SOLN: 4.0000 mg | Freq: Four times a day (QID) | INTRAMUSCULAR | Status: DC | PRN

## 2011-09-25 MED ORDER — INSULIN ASPART 100 UNIT/ML ~~LOC~~ SOLN
0.0000 [IU] | Freq: Three times a day (TID) | SUBCUTANEOUS | Status: DC
  Administered 2011-09-26: 3 [IU] via SUBCUTANEOUS
  Administered 2011-09-27: 5 [IU] via SUBCUTANEOUS
  Administered 2011-09-27: 3 [IU] via SUBCUTANEOUS

## 2011-09-25 MED ORDER — TERIPARATIDE (RECOMBINANT) 600 MCG/2.4ML ~~LOC~~ SOLN
20.0000 ug | SUBCUTANEOUS | Status: DC
  Administered 2011-09-25 – 2011-09-26 (×2): 20 ug via SUBCUTANEOUS

## 2011-09-25 MED ORDER — OLMESARTAN MEDOXOMIL 40 MG PO TABS: 40.0000 mg | ORAL_TABLET | Freq: Every day | ORAL | Status: DC

## 2011-09-25 NOTE — ED Notes (Signed)
Woke up feeling like he was having out of body experience  Sort of felt that way when he went to bed family states he cannot remember things at all

## 2011-09-25 NOTE — Progress Notes (Signed)
  Echocardiogram 2D Echocardiogram has been performed.  [name] [name] 09/25/2011, 4:58 PM

## 2011-09-25 NOTE — ED Notes (Signed)
[id number]

## 2011-09-25 NOTE — ED Notes (Signed)
RN to room due to O2 sat bell ringing. During assessment PA states pt stopped talking and had abnormal jaw movement. O2 sats down to 70% and pt with glazed look. Pt placed on 2L O2 and coached to take deep breaths, HR did not change however. Bed rails are up, will establish IV, and EDP is aware.

## 2011-09-25 NOTE — H&P (Signed)
[name] [name] is an 51 y.o. male.   History and Physical Note Family Medicine Teaching Service Service Pager: [phone]  Chief Complaint: confusion HPI: Patient is a 51 yo M with PMH of HTN, GERD, DM (recent dx), sleep apnea and Chron's Disease who presents to the ED for confusion. Patient states "something has been going on" since January when he had and episode of confusion. During that first episode, wife states he was not responsive and had memory loss. He was evaluated at [facility] and sent to Neuro for evaluation. Today, he woke up at 5:30 confused (per wife) and had incoherent speech. Questions were relevant but he was very confused about events he should know about such as where his daughter went to school and where he worked. Drove himself to work at [employer], and eventually came to ED for evaluation when confusion persisted. Patient states he could not remember anything from this morning until one hour ago.   Has history of Chron's disease but no large flare. Did have PNA a few months ago. 3 hospitalizations last year- Chron's/Hypokalemia in April, Osteo in May and AMS in Sept. Work up in Sept included MRI, carotid, echo- all within normal limits. Patient then began having episodes of confusion again in January. He has been evaluated by a Neurologist ([facility]) and had neg MRI and EEG.  He has been started on Metformin and Insulin recently, but no other new medications. Not currently on steroids. Denies any head trauma. Denies any exposures to metals, toxins, pesticides, etc. He is followed by [place] and Urgent care.  On review of systems, patient endorses dizziness and abdominal pain (secondary to Chron's); otherwise ROS negative.  ED course: Patient had CT scan and labwork done on admission, all within normal limits except elevated glucose. EDP reports episodes of "lip smacking" on her exam with increased confusion. Therefore, FPTS was called to evaluate the patient for  admission.  Past Medical History  Diagnosis Date  . Hypertension   . GERD (gastroesophageal reflux disease)   . Adenomatous colon polyp 10/1999  . Osteoporosis   . External hemorrhoids   . Crohn's disease of small and large intestines 1999  . Pneumonia   . Esophageal stricture   . MRSA (methicillin resistant Staphylococcus aureus) 10/2010   . Diabetes mellitus   . Hyperlipemia   . Cancer     skin  . Kidney stones   . Sleep apnea     CPAP machine    Past Surgical History  Procedure Date  . Shoulder surgery   . Sinus surgery with instatrak   . Elbow surgery 2012    elbow MRSA infection     Family History  Problem Relation Age of Onset  . Colon polyps Father   . Heart disease Father   . Stroke Father   . Heart disease Mother   . Colon cancer Neg Hx    Social History:  reports that he has never smoked. He has never used smokeless tobacco. He reports that he does not drink alcohol or use illicit drugs. Lives with wife and special needs son.  Allergies:  Allergies  Allergen Reactions  . Azithromycin   . Dilaudid (Hydromorphone Hcl) Other (See Comments)    n/v  . Penicillins Hives  . Sulfonamide Derivatives Hives    No current facility-administered medications on file as of 09/25/2011.   Medications Prior to Admission  Medication Sig Dispense Refill  . budesonide (ENTOCORT EC) 3 MG 24 hr capsule Take  9 mg by mouth daily.      Marland Kitchen CALCIUM PO Take 1 tablet by mouth 3 (three) times daily.       . Cyanocobalamin (VITAMIN B 12 PO) Take 1 tablet by mouth daily.      Marland Kitchen diltiazem (TIAZAC) 240 MG 24 hr capsule Take 240 mg by mouth daily.      Marland Kitchen esomeprazole (NEXIUM) 40 MG capsule Take 40 mg by mouth daily before breakfast.      . folic acid (FOLVITE) 1 MG tablet Take 1 mg by mouth daily.       Marland Kitchen glucose blood (ONE TOUCH ULTRA TEST) test strip Use as instructed  100 each  0  . insulin glargine (LANTUS) 100 UNIT/ML injection Inject 20-30 Units into the skin at bedtime. Depending  on his CBG readings      . Lactase (DAIRY-RELIEF PO) Take 3 tablets by mouth 3 (three) times daily.       . Lancets (ONETOUCH ULTRASOFT) lancets Use as instructed  100 each  0  . mercaptopurine (PURINETHOL) 50 MG tablet Take 100 mg by mouth daily. Give on an empty stomach 1 hour before or 2 hours after meals. Caution: Chemotherapy.      . mesalamine (ASACOL) 400 MG EC tablet Take 800 mg by mouth 3 (three) times daily.      . metFORMIN (GLUCOPHAGE) 500 MG tablet Take 500 mg by mouth daily with breakfast.      . methscopolamine (PAMINE FORTE) 5 MG tablet Take 5 mg by mouth 2 (two) times daily.      . potassium chloride SA (K-DUR,KLOR-CON) 20 MEQ tablet Take 20 mEq by mouth 2 (two) times daily.      . Saccharomyces boulardii (FLORASTOR PO) Take 1 tablet by mouth daily.       . valsartan (DIOVAN) 320 MG tablet Take 320 mg by mouth daily.      Marland Kitchen DISCONTD: insulin glargine (LANTUS SOLOSTAR) 100 UNIT/ML injection Inject 40 Units into the skin at bedtime.  5 pen  PRN    Results for orders placed during the hospital encounter of 09/25/11 (from the past 48 hour(s))  GLUCOSE, CAPILLARY     Status: Abnormal   Collection Time   09/25/11  8:48 AM      Component Value Range Comment   Glucose-Capillary 179 (*) 70 - 99 (mg/dL)    Comment 1 Notify RN     CBC     Status: Normal   Collection Time   09/25/11 10:23 AM      Component Value Range Comment   WBC 9.5  4.0 - 10.5 (K/uL)    RBC 4.37  4.22 - 5.81 (MIL/uL)    Hemoglobin 14.3  13.0 - 17.0 (g/dL)    HCT 42.4  39.0 - 52.0 (%)    MCV 97.0  78.0 - 100.0 (fL)    MCH 32.7  26.0 - 34.0 (pg)    MCHC 33.7  30.0 - 36.0 (g/dL)    RDW 14.5  11.5 - 15.5 (%)    Platelets 232  150 - 400 (K/uL)   BASIC METABOLIC PANEL     Status: Abnormal   Collection Time   09/25/11 10:23 AM      Component Value Range Comment   Sodium 139  135 - 145 (mEq/L)    Potassium 3.8  3.5 - 5.1 (mEq/L)    Chloride 102  96 - 112 (mEq/L)    CO2 26  19 - 32 (mEq/L)  Glucose, Bld 143 (*)  70 - 99 (mg/dL)    BUN 20  6 - 23 (mg/dL)    Creatinine, Ser 0.86  0.50 - 1.35 (mg/dL)    Calcium 9.6  8.4 - 10.5 (mg/dL)    GFR calc non Af Amer >90  >90 (mL/min)    GFR calc Af Amer >90  >90 (mL/min)   URINALYSIS, ROUTINE W REFLEX MICROSCOPIC     Status: Abnormal   Collection Time   09/25/11 10:33 AM      Component Value Range Comment   Color, Urine YELLOW  YELLOW     APPearance CLEAR  CLEAR     Specific Gravity, Urine 1.031 (*) 1.005 - 1.030     pH 5.5  5.0 - 8.0     Glucose, UA 100 (*) NEGATIVE (mg/dL)    Hgb urine dipstick NEGATIVE  NEGATIVE     Bilirubin Urine SMALL (*) NEGATIVE     Ketones, ur 15 (*) NEGATIVE (mg/dL)    Protein, ur NEGATIVE  NEGATIVE (mg/dL)    Urobilinogen, UA 0.2  0.0 - 1.0 (mg/dL)    Nitrite NEGATIVE  NEGATIVE     Leukocytes, UA NEGATIVE  NEGATIVE  MICROSCOPIC NOT DONE ON URINES WITH NEGATIVE PROTEIN, BLOOD, LEUKOCYTES, NITRITE, OR GLUCOSE <1000 mg/dL.  URINE RAPID DRUG SCREEN (HOSP PERFORMED)     Status: Normal   Collection Time   09/25/11 10:33 AM      Component Value Range Comment   Opiates NONE DETECTED  NONE DETECTED     Cocaine NONE DETECTED  NONE DETECTED     Benzodiazepines NONE DETECTED  NONE DETECTED     Amphetamines NONE DETECTED  NONE DETECTED     Tetrahydrocannabinol NONE DETECTED  NONE DETECTED     Barbiturates NONE DETECTED  NONE DETECTED     Dg Chest 2 View  09/25/2011  *RADIOLOGY REPORT*  Clinical Data: Some chest pain, confusion, diabetes  CHEST - 2 VIEW  Comparison: Chest x-ray of 08/29/2012and CT chest of 10/22/2010  Findings: No active infiltrate or effusion is seen.  Mild cardiomegaly is stable.   Mediastinal contours are stable. The prominence of the soft tissues overlying the aortic knob appears to be due to mediastinal lipomatosis when compared to the prior CT of the chest.  No acute bony abnormality is seen.  IMPRESSION: Stable chest x-ray.  Mild cardiomegaly.  Original Report Authenticated By: Joretta Bachelor, M.D.   Ct Head Wo  Contrast  09/25/2011  *RADIOLOGY REPORT*  Clinical Data: Altered mental status  CT HEAD WITHOUT CONTRAST  Technique:  Contiguous axial images were obtained from the base of the skull through the vertex without contrast.  Comparison: 08/13/2011  Findings: No mass effect, midline shift, or acute intracranial hemorrhage.  Mild global atrophy.  Anterior extraaxial space is prominent with visible venous structures.  No evidence of subdural hemorrhage.  Mastoid air cells and visualized paranasal sinuses are clear.  IMPRESSION: No active intracranial pathology.  Original Report Authenticated By: Jamas Lav, M.D.    Review of Systems  Constitutional: Negative for fever.  Eyes: Negative for blurred vision.  Respiratory: Negative for shortness of breath.   Cardiovascular: Negative for chest pain.  Gastrointestinal: Positive for abdominal pain.  Skin: Negative for rash.  Neurological: Positive for dizziness and headaches. Negative for tremors and focal weakness.    Blood pressure 114/63, pulse 94, temperature 97.7 F (36.5 C), temperature source Oral, resp. rate 14, SpO2 95.00%. Physical Exam  Constitutional: He is oriented  to person, place, and time. He appears well-developed and well-nourished. No distress.  HENT:  Head: Normocephalic and atraumatic.  Mouth/Throat: Oropharynx is clear and moist.  Eyes: Pupils are equal, round, and reactive to light.  Neck: Normal range of motion.  Cardiovascular: Regular rhythm.  Tachycardia present.   Respiratory: Effort normal and breath sounds normal. No respiratory distress.  GI: He exhibits no distension (but obese). There is generalized tenderness. There is no CVA tenderness.  Neurological: He is alert and oriented to person, place, and time. He has normal reflexes. He displays no tremor. No cranial nerve deficit or sensory deficit. He exhibits normal muscle tone. He displays a negative Romberg sign. Coordination and gait normal. GCS eye subscore is 4. GCS  verbal subscore is 5. GCS motor subscore is 6.  Reflex Scores:      Patellar reflexes are 2+ on the right side and 2+ on the left side. Skin: Skin is warm and dry.  Psychiatric: He has a normal mood and affect.     Assessment/Plan 51 yo M with PMH of HTN, GERD, sleep apnea, Chron's and DM presenting with altered mental status  1. Altered mental status- this is not the first occurrence of similar episode. Patient has been evaluated by Neurology with negative studies recently. Patient did have witnessed lip smacking and staring on EDP exam. Differential includes new onset absence seizures, TIA/stroke, psychiatric dissociative fugue,  and symptomatic arrythmia.  - Admit to inpatient; telemetry. Admitting physician  Dr. Nori Riis - Will get EKG on admission, and again in the morning. - MRI of brain with contrast to evaluate for any changes or new lesions, last MRI 08/13/11. (Patient will need to be premedicated with Valium IV prior to scan) - Monitor closely for any other changes in level of consciousness  - Will consult Neurology while in the hospital. Consider repeat EEG - For TIA/Stroke workup, will get MRA of neck to evaluate carotids - Will get 2D echo to evaluate for cause of possible arrythmia or abnormal wall motion/thrombus.  - Lipid panel, CBC and Cmet in the AM  2. HTN- BP stable at admission. Would allow some permissive hypertension in setting of r/o stroke - Continue to monitor BP - Currently holding home antihypertensives. Will restart when appropriate  3. DM- Patient with new diagnosis of DM, with mostly uncontrolled CBG at home. - Continue Lantus 40U daily - Start Moderate SSI  - Will check A1C  4. Sleep apnea- Patient uses CPAP at home. - Encourage wife to bring home machine. If needed, we can ask RT to set up CPAP for nighttime use - Monitor O2 sats overnight  5. Chron's Disease- Patient states he has chronic abdominal pain, it is worse with stress. Not currently on any  medications - Continue to monitor  6. FEN/GI- IVF @ 75cc/hr. Protonix for GI ppx  7. DVT PPx- Heparin SQ  8. Dispo- Pending work up and overall clinical improvement.  HAIRFORD, AMBER 09/25/2011, 1:47 PM  I examined the patient with Dr. Sheral Apley. I have reviewed the note, made necessary revisions and agree with above.  Dillen Belmontes 09/25/11, 6:43 PM

## 2011-09-25 NOTE — H&P (Signed)
FMTS Attending Admission Note: Dorcas Mcmurray MD [phone] pager office [phone] I  have seen and examined this patient, reviewed their chart. I have discussed this patient with the resident. I agree with the resident's findings, assessment and care plan. Additionally: I was inpatient attending during Mr [name] [name]'s hospitalization last year for similar episode. I remember him well. I reviewed the specifics of this episode at length with him and his wife and also wit his two co-workers who were present. In retrospect, he notes that yesterday evening is not a clear memory for him. He recalls going out to dinner with his wife, but cannot remember where or what he ate. His wife reports ta Hat this morning he seemed very disoruiented but adamantly was determined to go to work--he had some special items he had to accomplish befor 8 am---he could not tell her what those tasks were. She notes that he commented-"-I hope it is on my calendar because I cannot remember what it is I need to do"  . 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 He does not remember getting teh CT scan of his head.  He has hx of migraines for about 15 years--no particular aura but a headache that starts abruptly, associated with some irritability and phonophobia as well as photophobia.  He reports this morning he felt sort of like he was having an "out of body experience" in that he could see himself from outside his person.  I think the differentia diagosis includes: (most likely) 1. complictaed migraine( including [place] type typical of childhood migraines as well as transient global amnesia )    2,  atypical seizure  activity 3.TIA/stroke is lower on the differential as is  4. psychogenic fugue 5. Global ischemia related to arrhythmia (very unlikely) 6. Drug or metabolic encephalopathic effects---he has been on mercaptopurine and other UC drugs--will dbl check these with pharm but I am not aware of any unusual effects common to these.Marland Kitchen  Neurology consulted Will also proceed with full stroke / tia work up. Will change MRi of brain to MRI of brain w contrast for more complete evaluation brain pathology.

## 2011-09-25 NOTE — Progress Notes (Signed)
09/25/2011 patient came from the emergency room to [id number]. Patient is alert and oriented, stated he did not remember what happen today, but he seems alert mow. Patient was place on telemetry when arrive on unit. Patient skin abdomen is distended, stretch mark on abdomen, the groin area, right side. Crack area of sacrum is a little red. Heels pink, but blanches, it is also dry. Patient have history of MRSA, he was swab and place on contact. Medical laboratory scientific officer.

## 2011-09-25 NOTE — ED Notes (Signed)
Admitting here to see pt

## 2011-09-25 NOTE — ED Notes (Signed)
Pt states that starting last night he began to feel confused, wife states she did not notice any confusion, but this am when he woke up he was asking inappropriate questions, like where they're daughter was, and couldn't remember why he had said he needed to come to work early. Pt states that he does not remember any events of this am. States remembers talking to his wife and his coworker but that's it. Pt is alert and oriented x 3. With no neuro deficits. Pt reports feeling dizzy since he woke up this am. Denies any pain to head or chest. Pt states he has chronic abdominal pain from chrons.

## 2011-09-25 NOTE — ED Notes (Signed)
Admitting remains at bedside, pt and family updated on poc

## 2011-09-25 NOTE — ED Notes (Signed)
Admitting at bedside 

## 2011-09-25 NOTE — ED Provider Notes (Signed)
History     CSN: [id number]  Arrival date & time 09/25/11  2800   First MD Initiated Contact with Patient 09/25/11 [phone]      Chief Complaint  Patient presents with  . Altered Mental Status    (Consider location/radiation/quality/duration/timing/severity/associated sxs/prior treatment) HPI  Pt presents to the ED with complaints of confusion. He has had multiple episodes of this in the past in 2013 and has been worked up by Neurology with an EEG and MRI. He woke up at 5am with confusion, went to work where they told him to come to the ER. Pt continues to repeat himself, while im examining the patient he begins having "lipsmacking episode". Afterwards the patient is confused. Unable to complete HPI due to patient confusion.  Past Medical History  Diagnosis Date  . Hypertension   . GERD (gastroesophageal reflux disease)   . Adenomatous colon polyp 10/1999  . Osteoporosis   . External hemorrhoids   . Crohn's disease of small and large intestines 1999  . Pneumonia   . Esophageal stricture   . MRSA (methicillin resistant Staphylococcus aureus) 10/2010   . Diabetes mellitus   . Hyperlipemia   . Cancer     skin  . Kidney stones   . Sleep apnea     CPAP machine    Past Surgical History  Procedure Date  . Shoulder surgery   . Sinus surgery with instatrak   . Elbow surgery 2012    elbow MRSA infection     Family History  Problem Relation Age of Onset  . Colon polyps Father   . Heart disease Father   . Stroke Father   . Heart disease Mother   . Colon cancer Neg Hx     History  Substance Use Topics  . Smoking status: Never Smoker   . Smokeless tobacco: Never Used  . Alcohol Use: No      Review of Systems  Unable to perform ROS   Allergies  Azithromycin; Dilaudid; Penicillins; and Sulfonamide derivatives  Home Medications   Current Outpatient Rx  Name Route Sig Dispense Refill  . BUDESONIDE ER 3 MG PO CP24 Oral Take 9 mg by mouth daily.    Marland Kitchen CALCIUM PO Oral  Take 1 tablet by mouth 3 (three) times daily.     Marland Kitchen VITAMIN B 12 PO Oral Take 1 tablet by mouth daily.    Marland Kitchen DILTIAZEM HCL ER BEADS 240 MG PO CP24 Oral Take 240 mg by mouth daily.    Marland Kitchen ESOMEPRAZOLE MAGNESIUM 40 MG PO CPDR Oral Take 40 mg by mouth daily before breakfast.    . FOLIC ACID 1 MG PO TABS Oral Take 1 mg by mouth daily.     Marland Kitchen GLUCOSE BLOOD VI STRP  Use as instructed 100 each 0    Needs office visit  . INSULIN GLARGINE 100 UNIT/ML [place] SOLN Subcutaneous Inject 20-30 Units into the skin at bedtime. Depending on his CBG readings    . INSULIN GLARGINE 100 UNIT/ML [place] SOLN Subcutaneous Inject 40 Units into the skin daily.    Marland Kitchen DAIRY-RELIEF PO Oral Take 3 tablets by mouth 3 (three) times daily.     Glory Rosebush ULTRASOFT LANCETS MISC  Use as instructed 100 each 0    Needs office visit  . MERCAPTOPURINE 50 MG PO TABS Oral Take 100 mg by mouth daily. Give on an empty stomach 1 hour before or 2 hours after meals. Caution: Chemotherapy.    Marland Kitchen MESALAMINE 400 MG PO  TBEC Oral Take 800 mg by mouth 3 (three) times daily.    Marland Kitchen METFORMIN HCL 500 MG PO TABS Oral Take 500 mg by mouth daily with breakfast.    . METHSCOPOLAMINE BROMIDE 5 MG PO TABS Oral Take 5 mg by mouth 2 (two) times daily.    Marland Kitchen POTASSIUM CHLORIDE CRYS ER 20 MEQ PO TBCR Oral Take 20 mEq by mouth 2 (two) times daily.    Marland Kitchen FLORASTOR PO Oral Take 1 tablet by mouth daily.     . TERIPARATIDE (RECOMBINANT) 600 MCG/2.4ML [place] SOLN Subcutaneous Inject 20 mcg into the skin daily.    Marland Kitchen VALSARTAN 320 MG PO TABS Oral Take 320 mg by mouth daily.      BP 114/63  Pulse 94  Temp(Src) 97.7 F (36.5 C) (Oral)  Resp 14  SpO2 95%  Physical Exam  Nursing note and vitals reviewed. Constitutional: He appears well-developed and well-nourished. No distress.       Pt confused and repeating himself  HENT:  Head: Normocephalic and atraumatic.  Eyes: Pupils are equal, round, and reactive to light.  Neck: Normal range of motion. Neck supple.  Cardiovascular:  Normal rate and regular rhythm.   Pulmonary/Chest: Effort normal.  Abdominal: Soft.  Neurological: He is alert.  Skin: Skin is warm and dry.    ED Course  Procedures (including critical care time)  Labs Reviewed  GLUCOSE, CAPILLARY - Abnormal; Notable for the following:    Glucose-Capillary 179 (*)    All other components within normal limits  BASIC METABOLIC PANEL - Abnormal; Notable for the following:    Glucose, Bld 143 (*)    All other components within normal limits  URINALYSIS, ROUTINE W REFLEX MICROSCOPIC - Abnormal; Notable for the following:    Specific Gravity, Urine 1.031 (*)    Glucose, UA 100 (*)    Bilirubin Urine SMALL (*)    Ketones, ur 15 (*)    All other components within normal limits  CBC  URINE RAPID DRUG SCREEN (HOSP PERFORMED)   Dg Chest 2 View  09/25/2011  *RADIOLOGY REPORT*  Clinical Data: Some chest pain, confusion, diabetes  CHEST - 2 VIEW  Comparison: Chest x-ray of 08/29/2012and CT chest of 10/22/2010  Findings: No active infiltrate or effusion is seen.  Mild cardiomegaly is stable.   Mediastinal contours are stable. The prominence of the soft tissues overlying the aortic knob appears to be due to mediastinal lipomatosis when compared to the prior CT of the chest.  No acute bony abnormality is seen.  IMPRESSION: Stable chest x-ray.  Mild cardiomegaly.  Original Report Authenticated By: Joretta Bachelor, M.D.   Ct Head Wo Contrast  09/25/2011  *RADIOLOGY REPORT*  Clinical Data: Altered mental status  CT HEAD WITHOUT CONTRAST  Technique:  Contiguous axial images were obtained from the base of the skull through the vertex without contrast.  Comparison: 08/13/2011  Findings: No mass effect, midline shift, or acute intracranial hemorrhage.  Mild global atrophy.  Anterior extraaxial space is prominent with visible venous structures.  No evidence of subdural hemorrhage.  Mastoid air cells and visualized paranasal sinuses are clear.  IMPRESSION: No active intracranial  pathology.  Original Report Authenticated By: Jamas Lav, M.D.     1. Confusion       MDM  Family Practice has agreed to come see patient in ED and will hopefully admit. Head CT normal as well as laboratory work-up. Pt did not have anymore episodes of 'Lip smacking" while in ED  Linus Mako, PA 09/25/11 1257

## 2011-09-25 NOTE — ED Provider Notes (Signed)
Pt seen and evaluated, pt with altered mental status- does not remember events over the past several days.  Had isolated episode in the ED of decreased mental status, O2 desaturation which resolved after several seconds without any treatment.  Pt afterwards tired and continued confusion but awake and answering questions.  Strength 5/5 in extremities x 4, sensation intact, no deficit of cranial nerves.  Pt admitted to [facility] for further evaluation  Threasa Beards, MD 09/25/11 1310

## 2011-09-26 ENCOUNTER — Inpatient Hospital Stay (HOSPITAL_COMMUNITY): Payer: 59

## 2011-09-26 ENCOUNTER — Other Ambulatory Visit: Payer: Self-pay

## 2011-09-26 DIAGNOSIS — R569 Unspecified convulsions: Secondary | ICD-10-CM

## 2011-09-26 LAB — CBC
HCT: 44.1 % (ref 39.0–52.0)
Hemoglobin: 14.7 g/dL (ref 13.0–17.0)
MCV: 97.6 fL (ref 78.0–100.0)
WBC: 7.5 10*3/uL (ref 4.0–10.5)

## 2011-09-26 LAB — LIPID PANEL
HDL: 53 mg/dL (ref >39)
Total CHOL/HDL Ratio: 4.1 RATIO
Triglycerides: 178 mg/dL — ABNORMAL HIGH (ref <150)

## 2011-09-26 LAB — COMPREHENSIVE METABOLIC PANEL
ALT: 104 U/L — ABNORMAL HIGH (ref 0–53)
Albumin: 3.6 g/dL (ref 3.5–5.2)
Alkaline Phosphatase: 84 U/L (ref 39–117)
BUN: 12 mg/dL (ref 6–23)
Calcium: 9.7 mg/dL (ref 8.4–10.5)
GFR calc Af Amer: >90 mL/min (ref >90)
Glucose, Bld: 73 mg/dL (ref 70–99)
Potassium: 3.7 mEq/L (ref 3.5–5.1)
Sodium: 142 mEq/L (ref 135–145)
Total Protein: 6.7 g/dL (ref 6.0–8.3)

## 2011-09-26 LAB — GLUCOSE, CAPILLARY
Glucose-Capillary: 107 mg/dL — ABNORMAL HIGH (ref 70–99)
Glucose-Capillary: 162 mg/dL — ABNORMAL HIGH (ref 70–99)
Glucose-Capillary: 226 mg/dL — ABNORMAL HIGH (ref 70–99)

## 2011-09-26 MED ORDER — SIMVASTATIN 20 MG PO TABS
20.0000 mg | ORAL_TABLET | Freq: Every day | ORAL | Status: DC
  Administered 2011-09-26: 20 mg via ORAL
  Filled 2011-09-26 (×2): qty 1

## 2011-09-26 MED ORDER — GADOBENATE DIMEGLUMINE 529 MG/ML IV SOLN
20.0000 mL | Freq: Once | INTRAVENOUS | Status: AC | PRN
  Administered 2011-09-26: 20 mL via INTRAVENOUS

## 2011-09-26 MED ORDER — DIAZEPAM 5 MG/ML IJ SOLN
2.5000 mg | Freq: Once | INTRAMUSCULAR | Status: AC
  Administered 2011-09-26: 2.5 mg via INTRAVENOUS
  Filled 2011-09-26: qty 2

## 2011-09-26 NOTE — Progress Notes (Signed)
Routine EEG w/ video completed.

## 2011-09-26 NOTE — Progress Notes (Signed)
Utilization review completed. Rozanna Boer, RN, BSN. 09/26/11

## 2011-09-26 NOTE — Evaluation (Signed)
Speech Language Pathology Evaluation Patient Details Name: [name] [name] MRN: [id number] DOB: [birth year] Today's Date: 09/26/2011  Problem List:  Patient Active Problem List  Diagnoses  . HYPERLIPIDEMIA NEC/NOS  . HYPERTENSION, ESSENTIAL NOS  . EXTERNAL HEMORRHOIDS  . GERD  . CROHN'S DISEASE, LARGE AND SMALL INTESTINES  . OSTEOPOROSIS  . COUGH  . OTHER DYSPHAGIA  . TRANSAMINASES, SERUM, ELEVATED  . COLONIC POLYPS, ADENOMATOUS, HX OF  . Diabetes mellitus type II  . Altered mental status   Past Medical History:  Past Medical History  Diagnosis Date  . Hypertension   . GERD (gastroesophageal reflux disease)   . Adenomatous colon polyp 10/1999  . Osteoporosis   . External hemorrhoids   . Crohn's disease of small and large intestines 1999  . Pneumonia   . Esophageal stricture   . MRSA (methicillin resistant Staphylococcus aureus) 10/2010   . Diabetes mellitus   . Hyperlipemia   . Cancer     skin  . Kidney stones   . Sleep apnea     CPAP machine  . PONV (postoperative nausea and vomiting)    Past Surgical History:  Past Surgical History  Procedure Date  . Shoulder surgery   . Sinus surgery with instatrak   . Elbow surgery 2012    elbow MRSA infection     SLP Assessment/Plan/Recommendation Assessment Clinical Impression Statement: Pt is [facility] for speech, language and cognition. No f/u needed at this time. Discussed with pt. Thanks for this referral! SLP Recommendation/Assessment: Patient does not need any further Speech Lanaguage Pathology Services No Skilled Speech Therapy: Patient is independent with all cognitive/linguistic skills SLP Recommendations Follow up Recommendations: None Individuals Consulted Consulted and Agree with Results and Recommendations: Patient   SLP Evaluation Prior Functioning Cognitive/Linguistic Baseline: Within functional limits Type of Home: House Lives With: Spouse Vocation: Full time employment  Cognition Overall Cognitive  Status: Appears within functional limits for tasks assessed Arousal/Alertness: Awake/alert Orientation Level: Oriented X4 Attention: Divided Divided Attention: Appears intact Memory: Appears intact Awareness: Appears intact Problem Solving: Appears intact Executive Function: Reasoning;Sequencing;Organizing;Decision Making;Initiating;Self Monitoring;Self Correcting Reasoning: Appears intact Sequencing: Appears intact Organizing: Appears intact Decision Making: Appears intact Initiating: Appears intact Self Monitoring: Appears intact Self Correcting: Appears intact Safety/Judgment: Appears intact  Comprehension Auditory Comprehension Overall Auditory Comprehension: Appears within functional limits for tasks assessed Reading Comprehension Reading Status: Within funtional limits  Expression Verbal Expression Overall Verbal Expression: Appears within functional limits for tasks assessed  Oral/Motor Oral Motor/Sensory Function Overall Oral Motor/Sensory Function: Appears within functional limits for tasks assessed Motor Speech Overall Motor Speech: Appears within functional limits for tasks assessed Herbie Baltimore, MA CCC-SLP [phone]  Lynann Beaver 09/26/2011, 9:56 AM

## 2011-09-26 NOTE — Progress Notes (Signed)
PT Cancellation Note  Treatment cancelled today due to patient receiving procedure or test.  Pt off the floor for EEG    [name] [name] 09/26/2011, 1:57 PM Patric Vanpelt L. Christion Leonhard DPT [phone]

## 2011-09-26 NOTE — Progress Notes (Signed)
OT Discharge Note: OT order received and chart reviewed. Spoke with patient who states he is at baseline functioning with no weakness, numbness, visual deficits or balance deficits. RN confirmed pt has been ambulating in room to use restroom I'ly. No acute OT needs indicated at this time. Will sign off. Please re-order if change in status. Thanks! Darci Current, OTR/L Pager: [phone] 09/26/2011

## 2011-09-26 NOTE — Progress Notes (Signed)
Daily Progress Note [name] [name]. Maricela Bo, M.D., M.B.A  Family Medicine PGY-1 Pager [phone]   Subjective: Patient preparing to go for MRI States that this morning he is back to baseline, no confusion; states that yesterday AM he does not remember and that the PM was like he was "watching a movie" of himself He states that he had a migraine 2 days ago and is curious if that has any association   Objective: Vital signs in last 24 hours: Temp:  [97.5 F (36.4 C)-98.3 F (36.8 C)] 98.3 F (36.8 C) (04/04 0543) Pulse Rate:  [86-103] 89  (04/04 0543) Resp:  [18-21] 18  (04/04 0543) BP: (112-146)/(68-99) 112/68 mmHg (04/04 0543) SpO2:  [93 %-96 %] 96 % (04/04 0543) Weight:  [257 lb 0.9 oz (116.6 kg)] 257 lb 0.9 oz (116.6 kg) (04/03 2118) Weight change:  Last BM Date: 09/25/11  Intake/Output from previous day: 04/03 0701 - 04/04 0700 In: 1000 [IV Piggyback:1000] Out: 1850 [Urine:1850] Intake/Output this shift:    Gen: alert, oriented x 4, very polite and talkative Neuro: no focal deficits  Lab Results:  Basename 09/26/11 0600 09/25/11 1644  WBC 7.5 7.3  HGB 14.7 14.2  HCT 44.1 43.0  PLT 233 215   BMET  Basename 09/26/11 0600 09/25/11 1644 09/25/11 1023  NA 142 -- 139  K 3.7 -- 3.8  CL 103 -- 102  CO2 27 -- 26  GLUCOSE 73 -- 143*  BUN 12 -- 20  CREATININE 0.86 0.87 --  CALCIUM 9.7 -- 9.6   LDl 128 Total Cholesterol 218   Lab Results  Component Value Date   HGBA1C 8.6* 09/25/2011     Studies/Results: Cardiac Echo: mildly dilated aortic root; EF 60-65%   09/26/2011  *RADIOLOGY REPORT*  Clinical Data:   MRI: IMPRESSION: 1.  Stable, normal MRI appearance of the brain.  MRA NECK WITHOUT AND WITH CONTRAST   Negative neck MRA.  Intracranial MRA IMPRESSION: Stable and negative intracranial MRA.  Original Report Authenticated By: Randall An, M.D.   Medications: I have reviewed the patient's current medications.  Assessment/Plan: 51 yo M with PMH of HTN, GERD,  sleep apnea, Chron's and DM presented with transiently altered mental status that has now resolved.   1. Altered mental status- given normal Echo. telemetry, EKG, MRI, MRA, and CT this is most likely a seizure (complex partial) vs psychiatric fugue  - Will consult Neurology while in the hospital and repeat EEG   - Will get 2D echo to evaluate for cause of possible arrythmia or abnormal wall motion/thrombus.   2. HTN- BP stable at admission. Would allow some permissive hypertension in setting of r/o stroke  - Continue to monitor BP  - given home irbesartan,   3. Hyperlipidemia - elevated LDL and total cholesterol, goal for LDL < 100 based on ATPIII guidelines given DM - start simvastatin 20 mg  4. DM- Patient with new diagnosis of DM, with mostly uncontrolled CBG at home, HbA1C is 8.6, so not at goal, needs outpatient adjustment of diet since CBG well controlled here with carb modified diet  CBG (last 3)   Basename 09/26/11 1152 09/26/11 0750 09/25/11 2122  GLUCAP 107* 86 100*   - Continue Lantus 40U daily and moderate SSI  5. Sleep apnea- Patient uses CPAP at home.  - Encourage wife to bring home machine. If needed, we can ask RT to set up CPAP for nighttime use  - Monitor O2 sats overnight   6. Chron's Disease- Patient  states he has chronic abdominal pain, it is worse with stress.  - continue home meds of budesonide, mesalamine, methscopolamine, mercaptopurine , flora stor,   7. Osteoporosis - cont home teriparatide   6. FEN/GI- IVF @ 75cc/hr. Protonix for GI ppx   7. DVT PPx- Heparin SQ   8. Dispo- Pending work up and overall clinical improvement.      LOS: 1 day   Dewain Penning 09/26/2011, 1:45 PM

## 2011-09-26 NOTE — Progress Notes (Signed)
OT Cancellation Note  Treatment cancelled today due to patient receiving procedure or test: pt with SLP at 0900 and in Radiology at 1022. Wil continue to check back as schedule allows. Thank you! Darci Current, OTR/L Pager: [phone] 09/26/2011    Agostino Gorin 09/26/2011, 10:22 AM

## 2011-09-26 NOTE — Progress Notes (Signed)
I have seen and examined this patient. I have discussed with Dr Maricela Bo.  I agree with their findings and plans as documented in their progress note for today.  1. Acute Recurrent confusional events - Wife and Husband were historians for this interview. - 2 know prior episodes (September 12, January 13) - Recent prior non-sleep deprived, non-photic stimulation EEG by Dr Krista Blue (Neuro) was normal per patient's report.  - Similar presentations of acute prolonged disorientation, lip smacking, purposeless activity, amnesia that resolves after hours back to normal baseline. Patient has becomes belligerent during these confusional events which both he and his wife say are very different from his usual happy, pleasant demeanor.  - No history of CNS injury, stroke/TIA, Seizure disorders. No psychiatric history.  - Patient and Wife both deny patient's use of illicit drugs.  Both deny use of any alcohol - (+) migraine without aura.  Patient has noted that Migraines headache attacks preceded the last two events - No recent change in chronic medications except recent introduction of insulin.  No documented hypoglycemic episodes. CBG running in 200 to 400 range at home.  - Lip smacking with staring noted by Physician Assistant in ED yesterday. Lip smacking was followed by patient confusion to a degree that prevented collection of H&P information from patient.  - Patient is not confused this afternoon. He has vague recall of events yesterday, but does not recall coming to hospital or having Head CT though he was alert during this whole time period per his wife who accompanied him.  - Brain MRI/RA today with gadolinium was normal. - Concern for Partial Complex Seizure given patient's age, impairment of consciousness with what by history sounds like automatisms, post-ictal confusion with headache and fatigue, and uncharacteristic transient belligerent behavior.  - Plan: EEG, Consult neurology for evaluation and  treatment of acute recurrent confusional events.

## 2011-09-27 LAB — GLUCOSE, CAPILLARY

## 2011-09-27 MED ORDER — SIMVASTATIN 20 MG PO TABS: 20.0000 mg | ORAL_TABLET | Freq: Every day | ORAL | Status: DC

## 2011-09-27 NOTE — Evaluation (Signed)
Physical Therapy Evaluation and Discharge Patient Details Name: [name] [name] MRN: [id number] DOB: [birth year] Today's Date: 09/27/2011  Problem List:  Patient Active Problem List  Diagnoses  . HYPERLIPIDEMIA NEC/NOS  . HYPERTENSION, ESSENTIAL NOS  . EXTERNAL HEMORRHOIDS  . GERD  . CROHN'S DISEASE, LARGE AND SMALL INTESTINES  . OSTEOPOROSIS  . COUGH  . OTHER DYSPHAGIA  . TRANSAMINASES, SERUM, ELEVATED  . COLONIC POLYPS, ADENOMATOUS, HX OF  . Diabetes mellitus type II  . Altered mental status    Past Medical History:  Past Medical History  Diagnosis Date  . Hypertension   . GERD (gastroesophageal reflux disease)   . Adenomatous colon polyp 10/1999  . Osteoporosis   . External hemorrhoids   . Crohn's disease of small and large intestines 1999  . Pneumonia   . Esophageal stricture   . MRSA (methicillin resistant Staphylococcus aureus) 10/2010   . Diabetes mellitus   . Hyperlipemia   . Cancer     skin  . Kidney stones   . Sleep apnea     CPAP machine  . PONV (postoperative nausea and vomiting)    Past Surgical History:  Past Surgical History  Procedure Date  . Shoulder surgery   . Sinus surgery with instatrak   . Elbow surgery 2012    elbow MRSA infection     PT Assessment/Plan/Recommendation PT Assessment PT Recommendation/Assessment: Patent does not need any further PT services No Skilled PT: Patient is independent with all acitivity/mobility PT Recommendation Follow Up Recommendations: No PT follow up Equipment Recommended: None recommended by PT PT Goals     PT Evaluation Precautions/Restrictions  Restrictions Weight Bearing Restrictions: No Prior Functioning  Home Living Lives With: Spouse Receives Help From: Family Type of Home: House Home Layout: Two level;Bed/bath upstairs Alternate Level Stairs-Rails: Right;Left;Can reach both Alternate Level Stairs-Number of Steps: 12 Home Access: Level entry Bathroom Shower/Tub: Walk-in  shower;Door [employer] Toilet: Standard Bathroom Accessibility: No Home Adaptive Equipment: Built-in shower seat Prior Function Level of Independence: Independent with basic ADLs;Independent with gait;Independent with transfers;Independent with homemaking with ambulation Able to Take Stairs?: Yes Driving: Yes (will stop driving due to seizures) Vocation: Full time employment Leisure: Hobbies-yes (Comment) Comments: Walking.   Cognition Cognition Arousal/Alertness: Awake/alert Overall Cognitive Status: Appears within functional limits for tasks assessed Orientation Level: Oriented X4 Sensation/Coordination Sensation Light Touch: Appears Intact Proprioception: Appears Intact Coordination Gross Motor Movements are Fluid and Coordinated: Yes Fine Motor Movements are Fluid and Coordinated: Yes Extremity Assessment RUE Assessment RUE Assessment: Within Functional Limits LUE Assessment LUE Assessment: Within Functional Limits RLE Assessment RLE Assessment: Within Functional Limits LLE Assessment LLE Assessment: Within Functional Limits Mobility (including Balance) Bed Mobility Bed Mobility: Yes Supine to Sit: 7: Independent;HOB flat Transfers Transfers: Yes Sit to Stand: 7: Independent;From bed Stand to Sit: 7: Independent;To chair/3-in-1 Ambulation/Gait Ambulation/Gait: Yes Ambulation/Gait Assistance: 7: Independent Ambulation Distance (Feet): 150 Feet Assistive device: None Gait Pattern: Within Functional Limits Stairs: No Wheelchair Mobility Wheelchair Mobility: No  Posture/Postural Control Posture/Postural Control: No significant limitations Balance Balance Assessed: No Exercise    End of Session PT - End of Session Equipment Utilized During Treatment: Gait belt Activity Tolerance: Patient tolerated treatment well Patient left: in chair;with call bell in reach Nurse Communication: Mobility status for transfers;Mobility status for ambulation General Behavior  During Session: [facility] for tasks performed Cognition: [facility] for tasks performed  [name] [name] 09/27/2011, 9:59 AM [name] [name] DPT [phone]

## 2011-09-27 NOTE — Discharge Instructions (Signed)
Mr. [name], [name] were hospitalized as the results of your confusion. We believe that this is due to a complex partial seizure. As you know, your neurologist will be the most appropriate person to initiate medications for this condition if warranted. Until you have met with Dr. Krista Blue, I would not drive. This could obviously be very dangerous if you become very confused again. You follow-up appointment with Dr. Krista Blue is on Wednesday.   I also started you on a medication for your cholesterol. This is something that Dr. Verline Lema may choose to continue or not in the future. Otherwise, continue the medications that you were taking before admission.   Sincerely,   Dr. Maricela Bo

## 2011-09-27 NOTE — Discharge Summary (Signed)
I have seen and examined this patient. I have discussed with Dr Maricela Bo.  I agree with their findings and plans as documented in their discharge note for today.

## 2011-09-27 NOTE — Discharge Summary (Signed)
Physician Discharge Summary  Patient ID: [name] [name] MRN: [id number] DOB/AGE: 1961/01/19 51 y.o.  Admit date: 09/25/2011 Discharge date: 09/27/2011  Admission Diagnoses: Altered mental status   Discharge Diagnoses:  Principal Problem:  *Altered mental status Active Problems:  HYPERTENSION, ESSENTIAL NOS  CROHN'S DISEASE, LARGE AND SMALL INTESTINES  TRANSAMINASES, SERUM, ELEVATED  Diabetes mellitus type II   Discharged Condition: good  Hospital Course:  Mr. Ose was hospitalized with altered mental status of one day duration. It began in the morning. However, the patient drove to his job at [employer]. He displayed strange behavior and confusion and was taken to the ED. In the ED,he was noted to be repeating himself and lip smacking repeatedly. Mr. Brailsford underwent a comprehensive work-up for the altered mental status. This included negative head CT, MRI and MRA of the head and neck, normal echo, normal EKG, normal telemetry, negative urine toxicology screen, normal glucose and a pending EEG at the time of discharge. On day 2 of hospitalization, the patient states that he does not recall anything from the previous day until 8:00 PM. This amnesia includes driving to work, being evaluated in the ED, and admission to the hospital. For the rest of that evening, he had the sensation of depersonalization. However, on Day 2 he was much improved, completely oriented, and appropriately behaved.  The on-call neurologist was informally consulted and declined the consultation, because he felt that out work-up was comprehensive and sufficient. After 48 hours of observation in the hospital and no more acute events, Mr. Corsino was appropriate for discharge. Our working diagnosis for the altered mental status was complex partial seizure. He will follow-up with his neurologist Dr. Krista Blue this week.   During his stay, Mr. Sliker was treated with his home medications for HTN, Crohns, and Diabetes. Otherwise, the  only change to his medical regimen was the addition of a simvastatin. His lipids and and AST/ALT should be reassessed by his PCP at outpatient follow-up.   Consults: Speech Pathology, Occupational Therapy, Physical Therapy   Significant Diagnostic Studies:  Lipid Panel     Component Value Date/Time   CHOL 217* 09/26/2011 0600   TRIG 178* 09/26/2011 0600   HDL 53 09/26/2011 0600   CHOLHDL 4.1 09/26/2011 0600   VLDL 36 09/26/2011 0600   LDLCALC 128* 09/26/2011 0600    Lab Results  Component Value Date   TSH 1.264 09/25/2011   AST: 42 --> 69  ALT: 77 --> 104  Echocardiogram: EF 60-65%, mildly dilated aortic root    *RADIOLOGY REPORT*  Clinical Data: Altered mental status  CT HEAD WITHOUT CONTRAST  IMPRESSION:  No active intracranial pathology.  RADIOLOGY REPORT*  Comparison: Brain MRI and MRA 08/13/2011, 02/21/2011.  MRI HEAD WITHOUT AND WITH CONTRAST  IMPRESSION:  1. Stable, normal MRI appearance of the brain.  2. MRA findings are below.  IMPRESSION:  1. Negative neck MRA.  2. Intracranial MRA findings are below.  MRA HEAD WITHOUT CONTRAST  IMPRESSION:  Stable and negative intracranial MRA.   Discharge Exam: Blood pressure 128/81, pulse 100, temperature 97.6 F (36.4 C), temperature source Oral, resp. rate 20, weight 257 lb 0.9 oz (116.6 kg), SpO2 96.00%. Gen: alert, oriented x 4, very polite and talkative  HEENT: NCAT, PERRLA, EOMI, OP clear, moon facies  Cardiac: RRR, no murmurs Lungs: CTA-B Abdomen: obese, soft, NDNT, NABS Neuro: no focal deficits Psych: normal affect and speech   Disposition: 01-Home or Self Care  Discharge Orders    Future Orders Please  Complete By Expires   Discharge patient        Medication List  As of 09/27/2011  2:09 PM   TAKE these medications         budesonide 3 MG 24 hr capsule   Commonly known as: ENTOCORT EC   Take 9 mg by mouth daily.      CALCIUM PO   Take 1 tablet by mouth 3 (three) times daily.      DAIRY-RELIEF PO   Take 3  tablets by mouth 3 (three) times daily.      diltiazem 240 MG 24 hr capsule   Commonly known as: TIAZAC   Take 240 mg by mouth daily.      esomeprazole 40 MG capsule   Commonly known as: NEXIUM   Take 40 mg by mouth daily before breakfast.      FLORASTOR PO   Take 1 tablet by mouth daily.      folic acid 1 MG tablet   Commonly known as: FOLVITE   Take 1 mg by mouth daily.      FORTEO 600 MCG/2.4ML Soln   Generic drug: Teriparatide (Recombinant)   Inject 20 mcg into the skin daily.      glucose blood test strip   Use as instructed      insulin glargine 100 UNIT/ML injection   Commonly known as: LANTUS   Inject 20-30 Units into the skin at bedtime. Depending on his CBG readings      insulin glargine 100 UNIT/ML injection   Commonly known as: LANTUS   Inject 40 Units into the skin daily.      mercaptopurine 50 MG tablet   Commonly known as: PURINETHOL   Take 100 mg by mouth daily. Give on an empty stomach 1 hour before or 2 hours after meals. Caution: Chemotherapy.      mesalamine 400 MG EC tablet   Commonly known as: ASACOL   Take 800 mg by mouth 3 (three) times daily.      metFORMIN 500 MG tablet   Commonly known as: GLUCOPHAGE   Take 500 mg by mouth daily with breakfast.      methscopolamine 5 MG tablet   Commonly known as: PAMINE FORTE   Take 5 mg by mouth 2 (two) times daily.      onetouch ultrasoft lancets   Use as instructed      potassium chloride SA 20 MEQ tablet   Commonly known as: K-DUR,KLOR-CON   Take 20 mEq by mouth 2 (two) times daily.      simvastatin 20 MG tablet   Commonly known as: ZOCOR   Take 1 tablet (20 mg total) by mouth daily at 6 PM.      valsartan 320 MG tablet   Commonly known as: DIOVAN   Take 320 mg by mouth daily.      VITAMIN B 12 PO   Take 1 tablet by mouth daily.           Follow-up Information    Follow up with Marcial Pacas, MD on 10/02/2011. (appointment at 11:30 AM. Please call at least 24 hours in advance if you  need to cancel.)    Contact information:   [address] [place] [phone]          Signed: Dewain Penning 09/27/2011, 2:09 PM

## 2011-09-29 ENCOUNTER — Ambulatory Visit (INDEPENDENT_AMBULATORY_CARE_PROVIDER_SITE_OTHER): Payer: 59 | Admitting: Family Medicine

## 2011-09-29 VITALS — BP 93/65 | HR 103 | Temp 98.2°F | Resp 18 | Ht 71.0 in | Wt 255.8 lb

## 2011-09-29 DIAGNOSIS — G40909 Epilepsy, unspecified, not intractable, without status epilepticus: Secondary | ICD-10-CM

## 2011-09-29 DIAGNOSIS — K5090 Crohn's disease, unspecified, without complications: Secondary | ICD-10-CM

## 2011-09-29 DIAGNOSIS — E109 Type 1 diabetes mellitus without complications: Secondary | ICD-10-CM

## 2011-09-29 DIAGNOSIS — IMO0002 Reserved for concepts with insufficient information to code with codable children: Secondary | ICD-10-CM

## 2011-09-29 DIAGNOSIS — R7402 Elevation of levels of lactic acid dehydrogenase (LDH): Secondary | ICD-10-CM

## 2011-09-29 DIAGNOSIS — R569 Unspecified convulsions: Secondary | ICD-10-CM

## 2011-09-29 DIAGNOSIS — R748 Abnormal levels of other serum enzymes: Secondary | ICD-10-CM

## 2011-09-29 DIAGNOSIS — E1065 Type 1 diabetes mellitus with hyperglycemia: Secondary | ICD-10-CM

## 2011-09-29 LAB — COMPREHENSIVE METABOLIC PANEL
ALT: 90 U/L — ABNORMAL HIGH (ref 0–53)
AST: 40 U/L — ABNORMAL HIGH (ref 0–37)
Albumin: 3.8 g/dL (ref 3.5–5.2)
Alkaline Phosphatase: 80 U/L (ref 39–117)
BUN: 18 mg/dL (ref 6–23)
CO2: 21 mEq/L (ref 19–32)
Calcium: 10.1 mg/dL (ref 8.4–10.5)
Chloride: 108 mEq/L (ref 96–112)
Creat: 1.04 mg/dL (ref 0.50–1.35)
Glucose, Bld: 145 mg/dL — ABNORMAL HIGH (ref 70–99)
Potassium: 4.5 mEq/L (ref 3.5–5.3)
Sodium: 141 mEq/L (ref 135–145)
Total Bilirubin: 0.9 mg/dL (ref 0.3–1.2)
Total Protein: 6.5 g/dL (ref 6.0–8.3)

## 2011-09-29 LAB — POCT CBC
Granulocyte percent: 66.6 %G (ref 37–80)
HCT, POC: 46.9 % (ref 43.5–53.7)
Hemoglobin: 15.3 g/dL (ref 14.1–18.1)
Lymph, poc: 2 (ref 0.6–3.4)
MCH, POC: 32.1 pg — AB (ref 27–31.2)
MCHC: 32.6 g/dL (ref 31.8–35.4)
MCV: 98.5 fL — AB (ref 80–97)
MID (cbc): 0.7 (ref 0–0.9)
MPV: 9.5 fL (ref 0–99.8)
POC Granulocyte: 5.4 (ref 2–6.9)
POC LYMPH PERCENT: 24.9 %L (ref 10–50)
POC MID %: 8.5 %M (ref 0–12)
Platelet Count, POC: 298 10*3/uL (ref 142–424)
RBC: 4.76 M/uL (ref 4.69–6.13)
RDW, POC: 16.4 %
WBC: 8.1 10*3/uL (ref 4.6–10.2)

## 2011-09-29 LAB — POCT SEDIMENTATION RATE: POCT SED RATE: 36 mm/hr — AB (ref 0–22)

## 2011-09-29 LAB — TSH: TSH: 1.715 u[IU]/mL (ref 0.350–4.500)

## 2011-09-29 LAB — GLUCOSE, POCT (MANUAL RESULT ENTRY): POC Glucose: 138

## 2011-09-29 LAB — IRON: Iron: 108 ug/dL (ref 42–165)

## 2011-09-29 MED ORDER — INSULIN NPH (HUMAN) (ISOPHANE) 100 UNIT/ML ~~LOC~~ SUSP: SUBCUTANEOUS | Status: DC

## 2011-09-29 NOTE — Progress Notes (Signed)
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  He had MRI and MRA without abnormality.  He has not seen a neurologist since these began.  Had EEG on Friday. Blood sugars continue to run high:  Over 100 in am, 200-250 during the day, and 300 at night.  Taking Metformin and 40 of Lantus  Everything related to food smells awful.  He also had transaminases elevated.  O:  Alert, NAD Oriented,  CN II-XII intact  Chest clear Heart regular without murmur Abd:  Diffusely tender, no HSM Results for orders placed in visit on 09/29/11  POCT CBC      Component Value Range   WBC 8.1  4.6 - 10.2 (K/uL)   Lymph, poc 2.0  0.6 - 3.4    POC LYMPH PERCENT 24.9  10 - 50 (%L)   MID (cbc) 0.7  0 - 0.9    POC MID % 8.5  0 - 12 (%M)   POC Granulocyte 5.4  2 - 6.9    Granulocyte percent 66.6  37 - 80 (%G)   RBC 4.76  4.69 - 6.13 (M/uL)   Hemoglobin 15.3  14.1 - 18.1 (g/dL)   HCT, POC 46.9  43.5 - 53.7 (%)   MCV 98.5 (*) 80 - 97 (fL)   MCH, POC 32.1 (*) 27 - 31.2 (pg)   MCHC 32.6  31.8 - 35.4 (g/dL)   RDW, POC 16.4     Platelet Count, POC 298  142 - 424 (K/uL)   MPV 9.5  0 - 99.8 (fL)  GLUCOSE, POCT (MANUAL RESULT ENTRY)      Component Value Range   POC Glucose 138    A: The difficult situation. Patient has uncontrolled sugars in the afternoon, elevated transaminases, Crohn's disease, a new onset what appears to be partial complex seizures.  Plan: Add and npH insulin 10 units at noon, hold Zocor until transaminase results back, neurology consult this coming week  On see Mikkel back on Wednesday

## 2011-09-29 NOTE — Patient Instructions (Signed)
Return Wednesday for recheck at 1:00 (I'm coming in early) Start NPH insulin 10 units daily at noon

## 2011-10-01 NOTE — Procedures (Signed)
This routine EEG was requested in this 51 year old man with a history of altered mental status, episodic confusion, unresponsiveness and stereotypic lip-smacking episode.  He has had episodes of altered mental status previously.  There is a question of complex partial seizures.  MEDICATIONS:  Include diazepam.  The EEG was done with the patient awake, drowsy and asleep.  During periods of maximal wakefulness, he had a poorly regulated moderately sustained low to very low amplitude alpha rhythm, that had a frequency of between a 11 and 12 cycles per second.  Background activities were composed of low to very low amplitude alpha and beta activities.  There appeared to be suspicious spike discharges seen maximally in the left anterior to mid temporal region.  These were of low amplitude, but did have aftercoming slowing at times.  Photic stimulation produced an asymmetric driving response with much higher amplitudes seen over the right occipital regions.  Hyperventilation did result in bursts of slowing that sometimes seemed to be of higher amplitude and more persistent over the left hemisphere.  The patient became drowsy as characterized by the appearance of diffuse slower activities.  There was the emergence of what appeared to be vertex sharp waves that were symmetric along with symmetric K complexes and symmetric sleep spindle activity.  CLINICAL INTERPRETATION:  This routine EEG done with the patient awake, drowsy and asleep is abnormal.  The left hemisphere has a decreased driving response along with increased slowing over the left temporal region.  This suggests an underlying functional or structural abnormality in that area.  Additionally, the low amplitude spike discharges seen maximally over the anterior to mid temporal region suggest a possible source of seizures.  It is possible that these are merely small sharp spikes a benign epileptiform transient associated with sleep.   However, the appearance of after coming slowing suggests that this is not the case.          ______________________________ Neena Rhymes, MD    FP:OIPP D:  09/26/2011 17:37:36  T:  09/26/2011 18:32:22  Job #:  [id number]

## 2011-10-02 ENCOUNTER — Ambulatory Visit (INDEPENDENT_AMBULATORY_CARE_PROVIDER_SITE_OTHER): Payer: 59 | Admitting: Family Medicine

## 2011-10-02 VITALS — BP 127/85 | HR 95 | Temp 97.8°F | Resp 16 | Ht 69.5 in | Wt 260.0 lb

## 2011-10-02 DIAGNOSIS — R42 Dizziness and giddiness: Secondary | ICD-10-CM

## 2011-10-02 DIAGNOSIS — G40909 Epilepsy, unspecified, not intractable, without status epilepticus: Secondary | ICD-10-CM

## 2011-10-02 DIAGNOSIS — I10 Essential (primary) hypertension: Secondary | ICD-10-CM

## 2011-10-02 DIAGNOSIS — E109 Type 1 diabetes mellitus without complications: Secondary | ICD-10-CM

## 2011-10-02 NOTE — Patient Instructions (Signed)
Reduce losartan to 160 mg daily (DIOVARN) to hopefully reduce dizziness Change the Humulin - N to 12 unitis 2 hours after lunch

## 2011-10-02 NOTE — Progress Notes (Signed)
51 yo with diabetes and hypertension, complicated by Crohns and now a new seizure disorder.  He and his wife just came from the neurologist where an EEG showed slowing in the left temporal area consistent with seizure disorder.  He is starting Keppra.  No recent lip smacking activity, but he is limited for 6 months re: driving  His blood sugars are about 130 in the morning and rise to 250 in the evening.  He eats at 9, 12 and 5:30 or later. He is taking lantus 40 qam and Humulin N at noon.  He has been complaining of persistent lightheadedness, as well.  His blood pressure readings vary from 90 to 677 systolic.  The dizziness is almost constant and has no relationship with sugars.  O:  Spent 40 minutes with patient and wife. Alert with no neuro deficits.  Gait stable  A:  Blood sugar needs tweaking, and suspect dizziness is from over medication  P:  Reduce losartan to 160 (he still has some from former rx) and increase the Humulin N to 12 units at 2 pm followup 1 week.

## 2011-10-09 ENCOUNTER — Ambulatory Visit (INDEPENDENT_AMBULATORY_CARE_PROVIDER_SITE_OTHER): Payer: 59 | Admitting: Family Medicine

## 2011-10-09 VITALS — BP 121/89 | HR 90 | Temp 97.6°F | Resp 18 | Ht 71.0 in | Wt 263.0 lb

## 2011-10-09 DIAGNOSIS — E109 Type 1 diabetes mellitus without complications: Secondary | ICD-10-CM

## 2011-10-09 DIAGNOSIS — G40909 Epilepsy, unspecified, not intractable, without status epilepticus: Secondary | ICD-10-CM

## 2011-10-09 DIAGNOSIS — I10 Essential (primary) hypertension: Secondary | ICD-10-CM

## 2011-10-09 MED ORDER — VALSARTAN 160 MG PO TABS: 160.0000 mg | ORAL_TABLET | Freq: Every day | ORAL | Status: DC

## 2011-10-09 MED ORDER — INSULIN NPH (HUMAN) (ISOPHANE) 100 UNIT/ML ~~LOC~~ SUSP: SUBCUTANEOUS | Status: DC

## 2011-10-09 NOTE — Progress Notes (Signed)
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   He continues to feel lightheaded and somewhat confused at times.  He is still on the same dose of Keppra, but is scheduled to increase tomorrow.  He has had no more of the lip-smacking mannerism that prompted the diagnosis of seizure disorder, but one morning this past week he was difficult to arouse and couldn't recall his wife's early attempts at awakening.  He feels "drugged" most days, today being especially bad.  His sugars are <150 in the am but 200-250 om the evening with the 40 Lantus in am and 15 Humulin N each noon.  Blood pressures are 120-130/80 to 90 on the Diovan 160.  He has a craving for salty foods, and snacks on chips and coke "too much".    O:  Patient cheerful and alert.  He is seen with wife, also appropriate and offers some of the history. HEENT:  Unremarkable.  Same moon facies Neck:  Supple, no adenopathy Chest:  Clear Heart:  Reg with I/VI systolic murmur and occasional S4 Ext: 1+ edema, no rashes  BP sitting 130/90, standing 130/90  A:  Blood sugars are improving but still not at goal.  Diet needs improvement.  Wife is very supportive. I suspect the dizziness is largely coming from [place], as was the difficulty arousing patient the other day. He has gained 3# this past week and I went into a lengthy discussion about the chips and coke, and salt Overall, I think [name] [name] is more positive and able to manage his chronic problems better.  He told me how much he enjoys seeing his daughter play soccer in college games on the weekends.  P: Increase Humulin N to 20 units at noon Hold off on increasing the Keppra tomorrow.  Keep same dose until next week.  I've asked him to let Dr. Krista Blue know about this change in plans. Same dose of Diovan (160 daily) Reduce coke and chips Recheck in 1 week

## 2011-10-10 ENCOUNTER — Encounter: Payer: Self-pay | Admitting: Family Medicine

## 2011-10-11 ENCOUNTER — Ambulatory Visit (INDEPENDENT_AMBULATORY_CARE_PROVIDER_SITE_OTHER): Payer: 59 | Admitting: Physician Assistant

## 2011-10-11 VITALS — BP 110/74 | HR 103 | Temp 98.6°F | Resp 20 | Ht 71.0 in | Wt 265.0 lb

## 2011-10-11 DIAGNOSIS — R509 Fever, unspecified: Secondary | ICD-10-CM

## 2011-10-11 DIAGNOSIS — R05 Cough: Secondary | ICD-10-CM

## 2011-10-11 LAB — POCT CBC
HCT, POC: 46.1 % (ref 43.5–53.7)
Hemoglobin: 15.1 g/dL (ref 14.1–18.1)
Lymph, poc: 1.3 (ref 0.6–3.4)
MCH, POC: 32.3 pg — AB (ref 27–31.2)
MCHC: 32.8 g/dL (ref 31.8–35.4)
RBC: 4.67 M/uL — AB (ref 4.69–6.13)
WBC: 9.7 10*3/uL (ref 4.6–10.2)

## 2011-10-11 LAB — POCT INFLUENZA A/B: Influenza A, POC: NEGATIVE

## 2011-10-11 MED ORDER — OSELTAMIVIR PHOSPHATE 75 MG PO CAPS: 75.0000 mg | ORAL_CAPSULE | Freq: Two times a day (BID) | ORAL | Status: DC

## 2011-10-11 MED ORDER — DOXYCYCLINE HYCLATE 100 MG PO TABS: 100.0000 mg | ORAL_TABLET | Freq: Two times a day (BID) | ORAL | Status: DC

## 2011-10-11 MED ORDER — ALBUTEROL SULFATE HFA 108 (90 BASE) MCG/ACT IN AERS: 2.0000 | INHALATION_SPRAY | RESPIRATORY_TRACT | Status: DC | PRN

## 2011-10-11 MED ORDER — HYDROCODONE-ACETAMINOPHEN 5-325 MG PO TABS: 1.0000 | ORAL_TABLET | ORAL | Status: AC | PRN

## 2011-10-11 NOTE — Progress Notes (Signed)
  Subjective:    Patient ID: [name] [name], male    DOB: 05-01-1961, 51 y.o.   MRN: [id number]  HPI [name] [name] comes in today c/o runny nose yesterday, waking this morning with myalgias that worsened throughout the day with fever of 100.5 and chills and dry cough and HA.  Has had flu shot. No N/V/D.  BS ~ 150 this morning  Review of Systems As above    Objective:   Physical Exam  Constitutional: He appears well-developed and well-nourished.  HENT:  Right Ear: Tympanic membrane normal.  Left Ear: Tympanic membrane normal.  Nose: Mucosal edema and rhinorrhea present.  Cardiovascular: Normal rate and regular rhythm.   Pulmonary/Chest: Effort normal and breath sounds normal.       Dry cough   Lymphadenopathy:    He has no cervical adenopathy.     Results for orders placed in visit on 10/11/11  POCT CBC      Component Value Range   WBC 9.7  4.6 - 10.2 (K/uL)   Lymph, poc 1.3  0.6 - 3.4    POC LYMPH PERCENT 13.1  10 - 50 (%L)   MID (cbc) 0.9  0 - 0.9    POC MID % 9.3  0 - 12 (%M)   POC Granulocyte 7.5 (*) 2 - 6.9    Granulocyte percent 77.6  37 - 80 (%G)   RBC 4.67 (*) 4.69 - 6.13 (M/uL)   Hemoglobin 15.1  14.1 - 18.1 (g/dL)   HCT, POC 46.1  43.5 - 53.7 (%)   MCV 98.8 (*) 80 - 97 (fL)   MCH, POC 32.3 (*) 27 - 31.2 (pg)   MCHC 32.8  31.8 - 35.4 (g/dL)   RDW, POC 15.6     Platelet Count, POC 344  142 - 424 (K/uL)   MPV 9.4  0 - 99.8 (fL)  POCT INFLUENZA A/B      Component Value Range   Influenza A, POC Negative     Influenza B, POC Negative           Assessment & Plan:  Fever Cough Myalgias DM  Likely influenza but will CBC not showing viral infection and new dx of DM in February will watch symptoms closely.  Tamilful, vicodin for cough, Albuterol.  If no improvement or worsens, Doxy.  RTC if significant change in sx.

## 2011-10-16 ENCOUNTER — Ambulatory Visit (INDEPENDENT_AMBULATORY_CARE_PROVIDER_SITE_OTHER): Payer: 59 | Admitting: Family Medicine

## 2011-10-16 ENCOUNTER — Encounter: Payer: Self-pay | Admitting: Family Medicine

## 2011-10-16 VITALS — BP 106/83 | HR 92 | Resp 16 | Wt 253.8 lb

## 2011-10-16 DIAGNOSIS — E860 Dehydration: Secondary | ICD-10-CM

## 2011-10-16 DIAGNOSIS — R569 Unspecified convulsions: Secondary | ICD-10-CM

## 2011-10-16 DIAGNOSIS — E109 Type 1 diabetes mellitus without complications: Secondary | ICD-10-CM

## 2011-10-16 MED ORDER — ONETOUCH ULTRASOFT LANCETS MISC: Status: DC

## 2011-10-16 MED ORDER — SODIUM CHLORIDE 0.9 % IV SOLN
125.0000 mg | Freq: Once | INTRAVENOUS | Status: AC
  Administered 2011-10-16: 125 mg via INTRAVENOUS

## 2011-10-16 MED ORDER — GLUCOSE BLOOD VI STRP: ORAL_STRIP | Status: DC

## 2011-10-16 MED ORDER — INSULIN NPH (HUMAN) (ISOPHANE) 100 UNIT/ML ~~LOC~~ SUSP: SUBCUTANEOUS | Status: DC

## 2011-10-16 NOTE — Progress Notes (Signed)
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  This past week, the blood pressures have been under 100 and the blood sugars are ranging 100 and 150 all week. He had an episode of vomiting Sunday after taking the hydrocodone and also had horrible headache 2 days ago(Monday).  No further absence spells.  No focal weakness or double vision.  O:  BP drops to 80/60 when standing with increase pulse to over 110. Heart:  Reg, no murmur, rate Chest:  Clear HEENT:  Coated tongue Neck: supple Abdomen:  Soft, diffusely tender.  Patient seen with wife. He says he is discouraged.  Given 1 liter NS and 125 mg Solumedrol IV  A:  Dehydration, Crohn's Diabetes better controlled BP definitely too low  P:  Hold all but essential meds, stop the Diovan Given 1 liter of NS Spent 45 minutes with [name] [name] and his wife.

## 2011-10-16 NOTE — Patient Instructions (Signed)
Stop the medications that may be making you dehydrated and sick:  Docycycline, tamiflu, diovan, and also hold off on Forteo for a week. Recheck next Wednesday

## 2011-10-18 ENCOUNTER — Other Ambulatory Visit: Payer: Self-pay | Admitting: Family Medicine

## 2011-10-23 ENCOUNTER — Ambulatory Visit: Payer: 59 | Admitting: Family Medicine

## 2011-10-23 VITALS — BP 133/93 | HR 88 | Temp 98.1°F | Resp 20 | Ht 70.0 in | Wt 261.2 lb

## 2011-10-23 DIAGNOSIS — E109 Type 1 diabetes mellitus without complications: Secondary | ICD-10-CM

## 2011-10-23 DIAGNOSIS — N200 Calculus of kidney: Secondary | ICD-10-CM

## 2011-10-23 MED ORDER — TAMSULOSIN HCL 0.4 MG PO CAPS: 0.4000 mg | ORAL_CAPSULE | Freq: Every day | ORAL | Status: DC

## 2011-10-23 NOTE — Progress Notes (Signed)
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  He is no longer feeling dizzy or weak. His blood pressures now vary from 130 to 160/ 80 tp 90. His blood sugars now vary from 81 in am to 250 in evening  His job often forces him to snack or skip meals.  He is starting to have right flank pain similar to that with kidney stone in past  O:  NAD  BP 122/80 left arm sitting HEENT:  Unremarkable Chest:  Clear Heart:  Reg, no murmur Ext:  Normal gait, no edema  We spent 40 minutes reviewing past week's numbers  A:  Improving in all ways.  I don't want to change meds except to control the sugar.  P:  Increase Humulin N to 25 to 30 as indicated by previous day's numbers with goal of maintaining sugar between 100 and 200. Flomax 0.4 daily for presumed kidney stone Text me BP and sugar numbers in a week

## 2011-11-10 ENCOUNTER — Other Ambulatory Visit: Payer: Self-pay | Admitting: Family Medicine

## 2011-11-10 MED ORDER — TERIPARATIDE (RECOMBINANT) 600 MCG/2.4ML ~~LOC~~ SOLN: 20.0000 ug | Freq: Every day | SUBCUTANEOUS | Status: DC

## 2012-01-05 ENCOUNTER — Other Ambulatory Visit: Payer: Self-pay | Admitting: Gastroenterology

## 2012-01-05 ENCOUNTER — Other Ambulatory Visit: Payer: Self-pay | Admitting: Family Medicine

## 2012-01-06 ENCOUNTER — Telehealth: Payer: Self-pay

## 2012-01-06 NOTE — Telephone Encounter (Signed)
The patient called to request refill of Lantus and Diovan 156m to get him through to his appointment with [name] Wednesday 01/15/12.  The patient may be reached at his work number [phone].

## 2012-01-07 MED ORDER — INSULIN GLARGINE 100 UNIT/ML ~~LOC~~ SOLN: 40.0000 [IU] | Freq: Every day | SUBCUTANEOUS | Status: DC

## 2012-01-07 NOTE — Telephone Encounter (Signed)
Pt is calling in a second time to request a rx refill for lantus and diovan please contact him @ [phone] or his wife @ [phone]. He has an appt with Dr. Joseph Art on Wednesday 01-15-12

## 2012-01-07 NOTE — Telephone Encounter (Signed)
Spoke w/pt to clarify when he was put back on Diovan. Pt was taken off of it temporarily d/t GI problems on 4/24 and remained off of it at 10/23/11 OV at which time (see OV notes) Dr L asked pt to text him his BS readings in 1 week. Pt reports that he did that and Dr L put him back on the Diovan 160 mg QD at that time. Pt requests 10 days worth of Diovan 160 and 2 pens of his insulin to be sent to the [facility] to cover him until he sees Dr L next week. We just sent his insulin to [facility], but he will need some locally called in.

## 2012-01-07 NOTE — Telephone Encounter (Signed)
Left message on home number to call back; voicemail on cell has not been set up yet

## 2012-01-07 NOTE — Telephone Encounter (Signed)
lantus refilled but the Diovan was stopped on 10/16/11 (see ov).

## 2012-01-07 NOTE — Telephone Encounter (Signed)
  Patient's mg of diovan was reduced to Diovan 160 mg 1 qd; according to patient he was not taken off diovan.  Please call patient back and inform him where rx was sent.   Work: [phone]

## 2012-01-07 NOTE — Telephone Encounter (Signed)
Pt requests that when Rxs have been sent, please let his wife know on her cell at [phone]

## 2012-01-07 NOTE — Telephone Encounter (Signed)
That is fine to do the refills for 1 month.

## 2012-01-08 ENCOUNTER — Other Ambulatory Visit: Payer: Self-pay | Admitting: Physician Assistant

## 2012-01-08 MED ORDER — INSULIN GLARGINE 100 UNIT/ML ~~LOC~~ SOLN: 40.0000 [IU] | Freq: Every day | SUBCUTANEOUS | Status: DC

## 2012-01-08 MED ORDER — VALSARTAN 160 MG PO TABS: 160.0000 mg | ORAL_TABLET | Freq: Every day | ORAL | Status: DC

## 2012-01-08 NOTE — Telephone Encounter (Signed)
Spoke w/pt who requested change to #30 of Diovan sent to [facility] so that he will have enough until after his appt and until his new Rx from Dr L can get here from [place] in [facility]. Lantus pens sent locally and to [facility] by Thurmond Butts on other phone message from 01/08/12.

## 2012-01-08 NOTE — Progress Notes (Signed)
Refills done.

## 2012-01-08 NOTE — Progress Notes (Signed)
See previous phone message about pt needing 2 Lantus pens sent locally and also corrected Lantus RF approved yesterday to [facility] - changed to Solostar pens instead of vials, since pt requested "pens" to be sent locally.

## 2012-01-15 ENCOUNTER — Encounter: Payer: 59 | Admitting: *Deleted

## 2012-01-15 ENCOUNTER — Ambulatory Visit: Payer: 59 | Admitting: Gastroenterology

## 2012-01-16 ENCOUNTER — Other Ambulatory Visit: Payer: Self-pay | Admitting: Physician Assistant

## 2012-01-20 ENCOUNTER — Other Ambulatory Visit: Payer: Self-pay | Admitting: Family Medicine

## 2012-01-20 NOTE — Telephone Encounter (Signed)
Need chart

## 2012-01-21 ENCOUNTER — Telehealth: Payer: Self-pay | Admitting: Gastroenterology

## 2012-01-21 MED ORDER — BUDESONIDE 3 MG PO CP24: 9.0000 mg | ORAL_CAPSULE | Freq: Every day | ORAL | Status: DC

## 2012-01-21 NOTE — Telephone Encounter (Signed)
Prescription sent to pharmacy with one refill until scheduled appt on 01/28/12.

## 2012-01-26 NOTE — Telephone Encounter (Signed)
Chart pulled

## 2012-01-28 ENCOUNTER — Ambulatory Visit (INDEPENDENT_AMBULATORY_CARE_PROVIDER_SITE_OTHER): Payer: 59 | Admitting: Gastroenterology

## 2012-01-28 ENCOUNTER — Other Ambulatory Visit (INDEPENDENT_AMBULATORY_CARE_PROVIDER_SITE_OTHER): Payer: 59

## 2012-01-28 ENCOUNTER — Encounter: Payer: Self-pay | Admitting: Gastroenterology

## 2012-01-28 VITALS — BP 128/82 | HR 91 | Ht 71.0 in | Wt 262.3 lb

## 2012-01-28 DIAGNOSIS — Z86010 Personal history of colonic polyps: Secondary | ICD-10-CM

## 2012-01-28 DIAGNOSIS — K219 Gastro-esophageal reflux disease without esophagitis: Secondary | ICD-10-CM

## 2012-01-28 DIAGNOSIS — K5080 Crohn's disease of both small and large intestine without complications: Secondary | ICD-10-CM

## 2012-01-28 LAB — COMPREHENSIVE METABOLIC PANEL
ALT: 54 U/L — ABNORMAL HIGH (ref 0–53)
AST: 40 U/L — ABNORMAL HIGH (ref 0–37)
Alkaline Phosphatase: 87 U/L (ref 39–117)
CO2: 25 mEq/L (ref 19–32)
Sodium: 138 mEq/L (ref 135–145)
Total Bilirubin: 1.4 mg/dL — ABNORMAL HIGH (ref 0.3–1.2)
Total Protein: 7.4 g/dL (ref 6.0–8.3)

## 2012-01-28 LAB — CBC WITH DIFFERENTIAL/PLATELET
Basophils Absolute: 0 10*3/uL (ref 0.0–0.1)
HCT: 46.5 % (ref 39.0–52.0)
Lymphs Abs: 1.1 10*3/uL (ref 0.7–4.0)
Monocytes Absolute: 0.8 10*3/uL (ref 0.1–1.0)
Monocytes Relative: 7.9 % (ref 3.0–12.0)
Platelets: 310 10*3/uL (ref 150.0–400.0)
RDW: 15.5 % — ABNORMAL HIGH (ref 11.5–14.6)

## 2012-01-28 MED ORDER — METHSCOPOLAMINE BROMIDE 5 MG PO TABS: 5.0000 mg | ORAL_TABLET | Freq: Two times a day (BID) | ORAL | Status: DC

## 2012-01-28 MED ORDER — MESALAMINE 400 MG PO TBEC: 800.0000 mg | DELAYED_RELEASE_TABLET | Freq: Three times a day (TID) | ORAL | Status: DC

## 2012-01-28 MED ORDER — ESOMEPRAZOLE MAGNESIUM 40 MG PO CPDR: 40.0000 mg | DELAYED_RELEASE_CAPSULE | Freq: Every day | ORAL | Status: DC

## 2012-01-28 MED ORDER — BUDESONIDE 3 MG PO CP24: 9.0000 mg | ORAL_CAPSULE | Freq: Every day | ORAL | Status: DC

## 2012-01-28 MED ORDER — MERCAPTOPURINE 50 MG PO TABS: 100.0000 mg | ORAL_TABLET | Freq: Every day | ORAL | Status: DC

## 2012-01-28 NOTE — Patient Instructions (Addendum)
Your physician has requested that you go to the basement for the following lab work before leaving today:CBC, Cmet, Lipase.  Your prescriptions have been sent to your mail order pharmacy.   cc: [name], MD

## 2012-01-28 NOTE — Progress Notes (Signed)
History of Present Illness: This is a 51 year old male returning for followup of Crohn's ileocolitis. His symptoms have been relatively well controlled over the past few months. He has episodes of mild diarrhea and frequent mild right lower quadrant pain which is typical for him. Both of these symptoms have been under better control since starting budesonide. He had partial complex seizures diagnosed earlier this year and started Keppra.  Current Medications, Allergies, Past Medical History, Past Surgical History, Family History and Social History were reviewed in [employer] record.  Physical Exam: General: Well developed , well nourished, no acute distress Head: Normocephalic and atraumatic Eyes:  sclerae anicteric, EOMI Ears: Normal auditory acuity Mouth: No deformity or lesions Lungs: Clear throughout to auscultation Heart: Regular rate and rhythm; no murmurs, rubs or bruits Abdomen: Soft, mild right lower quadrant tenderness to deep palpation without rebound or guarding and non distended. No masses, hepatosplenomegaly or hernias noted. Normal Bowel sounds Musculoskeletal: Symmetrical with no gross deformities  Pulses:  Normal pulses noted Extremities: No clubbing, cyanosis, edema or deformities noted Neurological: Alert oriented x 4, grossly nonfocal Psychological:  Alert and cooperative. Normal mood and affect  Assessment and Recommendations:  1. Crohn's ileocolitis. Obtain a CBC, CMET and lipase today. Refill 6-mercaptopurine, budesonide, Pamine  and 5-ASA. Return office visit 6 months.  2. Personal history of adenomatous colon polyps. Surveillance colonoscopy due August 2014.  3. GERD. Continue standard antireflux measures. Refill Nexium.

## 2012-01-31 ENCOUNTER — Telehealth: Payer: Self-pay

## 2012-01-31 NOTE — Telephone Encounter (Signed)
See the last phone message about the medication change.  I sent a 30d to [facility] because pt stated that he had rx sent to [facility] - If he needs if it is ok to send to [facility] for a 3 month rx.

## 2012-01-31 NOTE — Telephone Encounter (Signed)
I have spoken to patient about Diovan he is requesting renewal on his diovan, he uses [facility] his dosage has changed recently to 160, looks like he may need to come in first, but I feel like I am missing something in his chart (Epic) please advise

## 2012-02-01 ENCOUNTER — Encounter: Payer: Self-pay | Admitting: Family Medicine

## 2012-02-01 ENCOUNTER — Ambulatory Visit: Payer: 59 | Admitting: Family Medicine

## 2012-02-01 ENCOUNTER — Other Ambulatory Visit: Payer: Self-pay | Admitting: Family Medicine

## 2012-02-01 VITALS — BP 111/79 | HR 89 | Temp 97.8°F | Resp 16 | Ht 71.0 in | Wt 259.0 lb

## 2012-02-01 DIAGNOSIS — E1165 Type 2 diabetes mellitus with hyperglycemia: Secondary | ICD-10-CM

## 2012-02-01 DIAGNOSIS — IMO0001 Reserved for inherently not codable concepts without codable children: Secondary | ICD-10-CM

## 2012-02-01 LAB — POCT GLYCOSYLATED HEMOGLOBIN (HGB A1C): Hemoglobin A1C: 7.4

## 2012-02-01 MED ORDER — METFORMIN HCL ER 500 MG PO TB24: 500.0000 mg | ORAL_TABLET | Freq: Every day | ORAL | Status: DC

## 2012-02-01 MED ORDER — FOLIC ACID 1 MG PO TABS: 1.0000 mg | ORAL_TABLET | Freq: Every day | ORAL | Status: DC

## 2012-02-01 MED ORDER — VALSARTAN 160 MG PO TABS: 160.0000 mg | ORAL_TABLET | Freq: Every day | ORAL | Status: DC

## 2012-02-01 MED ORDER — DILTIAZEM HCL ER BEADS 240 MG PO CP24: 240.0000 mg | ORAL_CAPSULE | Freq: Every day | ORAL | Status: DC

## 2012-02-01 MED ORDER — POTASSIUM CHLORIDE CRYS ER 20 MEQ PO TBCR: 20.0000 meq | EXTENDED_RELEASE_TABLET | Freq: Two times a day (BID) | ORAL | Status: DC

## 2012-02-01 NOTE — Progress Notes (Signed)
@[email or web address] @  Patient ID: [name] [name] MRN: [id number], DOB: 12/10/1960, 51 y.o. Date of Encounter: 02/01/2012, 8:08 AM  Primary Physician: [name], MD  Chief Complaint: Diabetes follow up  HPI: 51 y.o. year old male with history below presents for follow up of diabetes mellitus.. Taking medications daily without adverse effects. No polydipsia, polyphagia, polyuria, or nocturia.  Blood sugars at home:  150-250 Stress:  Special needs child(son), work(more and more intense with people leaving), sister's finances, daughter ok Diabetes meds:  Lantus:  40 units in am and 4 nights out of seven he has to take another 40 because the sugar is too high  Humalog:  30 units in the afternoon  Patient went on cruise and had another seizure with a coexistent BS of 65.  Seizure meds were increased. Diet consists of:  yogurt Exercising regularly. Walking some     Past Medical History  Diagnosis Date  . Hypertension   . GERD (gastroesophageal reflux disease)   . Adenomatous colon polyp 10/1999  . Osteoporosis   . External hemorrhoids   . Crohn's disease of small and large intestines 1999  . Pneumonia   . Esophageal stricture   . MRSA (methicillin resistant Staphylococcus aureus) 10/2010   . Diabetes mellitus   . Hyperlipemia   . Cancer     skin  . Kidney stones   . Sleep apnea     CPAP machine  . PONV (postoperative nausea and vomiting)   . Complex partial seizure      Home Meds: Prior to Admission medications   Medication Sig Start Date End Date Taking? Authorizing Provider  budesonide (ENTOCORT EC) 3 MG 24 hr capsule Take 3 capsules (9 mg total) by mouth daily. 01/28/12  Yes Ladene Artist, MD,FACG  CALCIUM PO Take 1 tablet by mouth 3 (three) times daily.    Yes Historical Provider, MD  Cyanocobalamin (VITAMIN B 12 PO) Take 1 tablet by mouth daily.   Yes Historical Provider, MD  diltiazem [facility]) 240 MG 24 hr capsule TAKE 1 CAPSULE DAILY 01/05/12  Yes Chelle S Jeffery, PA-C    esomeprazole (NEXIUM) 40 MG capsule Take 1 capsule (40 mg total) by mouth daily before breakfast. 01/28/12  Yes Ladene Artist, MD,FACG  folic acid (FOLVITE) 1 MG tablet Take 1 mg by mouth daily.    Yes Historical Provider, MD  glucose blood (CHOICE DM FORA G20 TEST STRIPS) test strip Use as instructed 10/16/11 10/15/12 Yes [name], MD  glucose blood (ONE TOUCH ULTRA TEST) test strip Use as instructed 09/05/11 09/04/12 Yes Sarah Alleen Borne, PA-C  insulin glargine (LANTUS SOLOSTAR) 100 UNIT/ML injection Inject 40 Units into the skin at bedtime. 01/08/12 01/07/13 Yes Ryan M Dunn, PA-C  insulin glargine (LANTUS SOLOSTAR) 100 UNIT/ML injection Inject 40 Units into the skin at bedtime. 01/08/12 01/07/13 Yes Ryan M Dunn, PA-C  insulin NPH (HUMULIN N) 100 UNIT/ML injection Take 10 units daily at noon 10/16/11  Yes [name], MD  Lactase (DAIRY-RELIEF PO) Take 3 tablets by mouth 3 (three) times daily.    Yes Historical Provider, MD  Lancets [facility] ULTRASOFT) lancets Use as instructed 10/16/11 10/15/12 Yes [name], MD  levETIRAcetam (KEPPRA) 250 MG tablet Take 250 mg by mouth every 12 (twelve) hours.   Yes Historical Provider, MD  mercaptopurine (PURINETHOL) 50 MG tablet Take 2 tablets (100 mg total) by mouth daily. Give on an empty stomach 1 hour before or 2 hours after meals. Caution: Chemotherapy. 01/28/12  Yes Ladene Artist,  MD,FACG  mesalamine (ASACOL) 400 MG EC tablet Take 2 tablets (800 mg total) by mouth 3 (three) times daily. 01/28/12  Yes Ladene Artist, MD,FACG  methscopolamine (PAMINE FORTE) 5 MG tablet Take 1 tablet (5 mg total) by mouth 2 (two) times daily. 01/28/12  Yes Ladene Artist, MD,FACG  OVER THE COUNTER MEDICATION Taken Florastor OTC one tablet at night   Yes Historical Provider, MD  potassium chloride SA (K-DUR,KLOR-CON) 20 MEQ tablet TAKE 1 TO 2 TABLETS DAILY 01/16/12  Yes Argentina Donovan, PA-C  RELPAX 40 MG tablet TAKE 1 TABLET DAILY AS NEEDED FOR HEADACHE 01/20/12  Yes Mancel Bale, PA-C  Saccharomyces boulardii (FLORASTOR PO) Take 1 tablet by mouth daily.    Yes Historical Provider, MD  Tamsulosin HCl (FLOMAX) 0.4 MG CAPS Take 1 capsule (0.4 mg total) by mouth daily. 10/23/11  Yes [name], MD  valsartan (DIOVAN) 160 MG tablet Take 1 tablet (160 mg total) by mouth daily. 01/08/12 01/07/13 Yes Mancel Bale, PA-C    Allergies:  Allergies  Allergen Reactions  . Azithromycin   . Dilaudid (Hydromorphone Hcl) Other (See Comments)    n/v  . Penicillins Hives  . Sulfonamide Derivatives Hives    History   Social History  . Marital Status: Married    Spouse Name: N/A    Number of Children: N/A  . Years of Education: N/A   Occupational History  . [employer]   Social History Main Topics  . Smoking status: Never Smoker   . Smokeless tobacco: Never Used  . Alcohol Use: No  . Drug Use: No  . Sexually Active: Yes   Other Topics Concern  . Not on file   Social History Narrative  . No narrative on file     Review of Systems: Constitutional: negative for chills, fever, night sweats, weight changes, or fatigue  HEENT: negative for vision changes, hearing loss, congestion, rhinorrhea, or epistaxis Cardiovascular: negative for chest pain, palpitations, diaphoresis, DOE, orthopnea, or edema Respiratory: negative for hemoptysis, wheezing, shortness of breath, dyspnea, or cough Abdominal: negative for abdominal pain, nausea, vomiting, diarrhea, or constipation Dermatological: negative for  erythema, or wounds Neurologic: negative for headache, dizziness, or syncope Renal:  Negative for polyuria, polydipsia, or dysuria All other systems reviewed and are otherwise negative with the exception to those above and in the HPI.   Physical Exam: Blood pressure 111/79, pulse 89, temperature 97.8 F (36.6 C), temperature source Oral, resp. rate 16, height 5' 11"  (1.803 m), weight 259 lb (117.482 kg), SpO2 100.00%., Body mass index is  36.12 kg/(m^2). General: Well developed, well nourished, in no acute distress. Head: Normocephalic, atraumatic, eyes without discharge, sclera non-icteric, nares are without discharge. Bilateral auditory canals clear, TM's are without perforation, pearly grey and translucent with reflective cone of light bilaterally. Oral cavity moist, posterior pharynx without exudate, erythema, peritonsillar abscess, or post nasal drip.  Neck: Supple. No thyromegaly. Full ROM. No lymphadenopathy. Lungs: Clear bilaterally to auscultation without wheezes, rales, or rhonchi. Breathing is unlabored. Heart: RRR with S1 S2. No murmurs, rubs, or gallops appreciated. Abdomen: Soft, non-tender, non-distended with normoactive bowel sounds. No hepatosplenomegaly. No rebound/guarding. No obvious abdominal masses. Msk:  Strength and tone normal for age. Extremities/Skin: Warm and dry. No clubbing or cyanosis. No edema. Patient has ecchymoses on left forearm, no other wounds, or suspicious lesions. Monofilament exam unremarkable bilaterally.  Neuro: Alert and oriented X 3. Moves all extremities spontaneously. Gait is normal. CNII-XII grossly  in tact. Psych:  Responds to questions appropriately with a normal affect.   Labs: Results for orders placed in visit on 01/28/12  CBC WITH DIFFERENTIAL      Component Value Range   WBC 9.9  4.5 - 10.5 K/uL   RBC 4.70  4.22 - 5.81 Mil/uL   Hemoglobin 15.3  13.0 - 17.0 g/dL   HCT 46.5  39.0 - 52.0 %   MCV 99.1  78.0 - 100.0 fl   MCHC 33.0  30.0 - 36.0 g/dL   RDW 15.5 (*) 11.5 - 14.6 %   Platelets 310.0  150.0 - 400.0 K/uL   Neutrophils Relative 80.3 (*) 43.0 - 77.0 %   Lymphocytes Relative 11.6 (*) 12.0 - 46.0 %   Monocytes Relative 7.9  3.0 - 12.0 %   Eosinophils Relative 0.1  0.0 - 5.0 %   Basophils Relative 0.1  0.0 - 3.0 %   Neutro Abs 7.9 (*) 1.4 - 7.7 K/uL   Lymphs Abs 1.1  0.7 - 4.0 K/uL   Monocytes Absolute 0.8  0.1 - 1.0 K/uL   Eosinophils Absolute 0.0  0.0 - 0.7 K/uL    Basophils Absolute 0.0  0.0 - 0.1 K/uL  COMPREHENSIVE METABOLIC PANEL      Component Value Range   Sodium 138  135 - 145 mEq/L   Potassium 4.0  3.5 - 5.1 mEq/L   Chloride 101  96 - 112 mEq/L   CO2 25  19 - 32 mEq/L   Glucose, Bld 153 (*) 70 - 99 mg/dL   BUN 16  6 - 23 mg/dL   Creatinine, Ser 0.9  0.4 - 1.5 mg/dL   Total Bilirubin 1.4 (*) 0.3 - 1.2 mg/dL   Alkaline Phosphatase 87  39 - 117 U/L   AST 40 (*) 0 - 37 U/L   ALT 54 (*) 0 - 53 U/L   Total Protein 7.4  6.0 - 8.3 g/dL   Albumin 3.9  3.5 - 5.2 g/dL   Calcium 9.3  8.4 - 10.5 mg/dL   GFR 94.88  >60.00 mL/min  LIPASE      Component Value Range   Lipase 25.0  11.0 - 59.0 U/L    I spent 45 minutes face to face with patient  ASSESSMENT AND PLAN:  51 y.o. year old male with uncontrolled diabetes - Change to Lantus 40 units am, 20 units pm, and metformin 500 XR qd.  Call tid blood sugars tid via email Signed, [name], MD 02/01/2012 8:08 AM

## 2012-02-01 NOTE — Telephone Encounter (Signed)
Patient came in and was seen today.  Dr. Carlean Jews, would you like longer supply of Diovan ordered for patient?  Looks like it was only #30.Marland KitchenMarland Kitchen

## 2012-02-01 NOTE — Telephone Encounter (Signed)
I will reorder.  Sorry for the problem

## 2012-02-21 ENCOUNTER — Encounter (HOSPITAL_COMMUNITY): Payer: Self-pay | Admitting: *Deleted

## 2012-02-21 ENCOUNTER — Emergency Department (HOSPITAL_COMMUNITY): Payer: No Typology Code available for payment source

## 2012-02-21 ENCOUNTER — Emergency Department (HOSPITAL_COMMUNITY)
Admission: EM | Admit: 2012-02-21 | Discharge: 2012-02-21 | Disposition: A | Discharge status: Home / Self Care | Payer: No Typology Code available for payment source | Attending: Emergency Medicine | Admitting: Emergency Medicine

## 2012-02-21 DIAGNOSIS — S161XXA Strain of muscle, fascia and tendon at neck level, initial encounter: Secondary | ICD-10-CM

## 2012-02-21 DIAGNOSIS — Y92410 Unspecified street and highway as the place of occurrence of the external cause: Secondary | ICD-10-CM | POA: Insufficient documentation

## 2012-02-21 DIAGNOSIS — M542 Cervicalgia: Secondary | ICD-10-CM | POA: Insufficient documentation

## 2012-02-21 DIAGNOSIS — G43909 Migraine, unspecified, not intractable, without status migrainosus: Secondary | ICD-10-CM | POA: Insufficient documentation

## 2012-02-21 DIAGNOSIS — Z79899 Other long term (current) drug therapy: Secondary | ICD-10-CM | POA: Insufficient documentation

## 2012-02-21 DIAGNOSIS — K5090 Crohn's disease, unspecified, without complications: Secondary | ICD-10-CM | POA: Insufficient documentation

## 2012-02-21 DIAGNOSIS — E785 Hyperlipidemia, unspecified: Secondary | ICD-10-CM | POA: Insufficient documentation

## 2012-02-21 DIAGNOSIS — S139XXA Sprain of joints and ligaments of unspecified parts of neck, initial encounter: Secondary | ICD-10-CM | POA: Insufficient documentation

## 2012-02-21 DIAGNOSIS — I10 Essential (primary) hypertension: Secondary | ICD-10-CM | POA: Insufficient documentation

## 2012-02-21 DIAGNOSIS — M810 Age-related osteoporosis without current pathological fracture: Secondary | ICD-10-CM | POA: Insufficient documentation

## 2012-02-21 HISTORY — DX: Migraine, unspecified, not intractable, without status migrainosus: G43.909

## 2012-02-21 MED ORDER — METOCLOPRAMIDE HCL 10 MG PO TABS
10.0000 mg | ORAL_TABLET | Freq: Once | ORAL | Status: AC
  Administered 2012-02-21: 10 mg via ORAL
  Filled 2012-02-21: qty 1

## 2012-02-21 MED ORDER — METHOCARBAMOL 500 MG PO TABS
1000.0000 mg | ORAL_TABLET | Freq: Once | ORAL | Status: AC
  Administered 2012-02-21: 1000 mg via ORAL
  Filled 2012-02-21: qty 2

## 2012-02-21 MED ORDER — METOCLOPRAMIDE HCL 10 MG PO TABS: 10.0000 mg | ORAL_TABLET | Freq: Four times a day (QID) | ORAL | Status: DC

## 2012-02-21 MED ORDER — TRAMADOL HCL 50 MG PO TABS: 50.0000 mg | ORAL_TABLET | Freq: Four times a day (QID) | ORAL | Status: AC | PRN

## 2012-02-21 MED ORDER — METHOCARBAMOL 500 MG PO TABS: 500.0000 mg | ORAL_TABLET | Freq: Two times a day (BID) | ORAL | Status: AC

## 2012-02-21 MED ORDER — MORPHINE SULFATE 4 MG/ML IJ SOLN
4.0000 mg | Freq: Once | INTRAMUSCULAR | Status: AC
  Administered 2012-02-21: 4 mg via INTRAMUSCULAR
  Filled 2012-02-21: qty 1
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
## 2012-02-21 NOTE — ED Notes (Signed)
Pt remains out of room

## 2012-02-21 NOTE — ED Notes (Signed)
Patient transported to CT 

## 2012-02-26 ENCOUNTER — Ambulatory Visit (INDEPENDENT_AMBULATORY_CARE_PROVIDER_SITE_OTHER): Payer: 59 | Admitting: Family Medicine

## 2012-02-26 ENCOUNTER — Ambulatory Visit: Payer: 59

## 2012-02-26 VITALS — BP 115/77 | HR 85 | Temp 97.9°F | Resp 18 | Ht 70.0 in | Wt 257.0 lb

## 2012-02-26 DIAGNOSIS — R0602 Shortness of breath: Secondary | ICD-10-CM

## 2012-02-26 DIAGNOSIS — E119 Type 2 diabetes mellitus without complications: Secondary | ICD-10-CM

## 2012-02-26 DIAGNOSIS — J9801 Acute bronchospasm: Secondary | ICD-10-CM

## 2012-02-26 DIAGNOSIS — T7840XA Allergy, unspecified, initial encounter: Secondary | ICD-10-CM

## 2012-02-26 DIAGNOSIS — R220 Localized swelling, mass and lump, head: Secondary | ICD-10-CM

## 2012-02-26 LAB — POCT CBC
HCT, POC: 48.6 % (ref 43.5–53.7)
Hemoglobin: 15.1 g/dL (ref 14.1–18.1)
Lymph, poc: 1.2 (ref 0.6–3.4)
MCH, POC: 31.5 pg — AB (ref 27–31.2)
MCHC: 31.1 g/dL — AB (ref 31.8–35.4)
RBC: 4.8 M/uL (ref 4.69–6.13)
WBC: 7 10*3/uL (ref 4.6–10.2)

## 2012-02-26 MED ORDER — EPINEPHRINE 0.3 MG/0.3ML IJ DEVI: 0.3000 mg | Freq: Once | INTRAMUSCULAR | Status: DC

## 2012-02-26 MED ORDER — PREDNISONE 20 MG PO TABS: ORAL_TABLET | ORAL | Status: DC

## 2012-02-26 MED ORDER — METHYLPREDNISOLONE ACETATE 80 MG/ML IJ SUSP
80.0000 mg | Freq: Once | INTRAMUSCULAR | Status: AC
  Administered 2012-02-26: 80 mg via INTRAMUSCULAR
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
## 2012-02-26 NOTE — Patient Instructions (Addendum)
1. Shortness of breath  DG Chest 2 View  2. Bronchospasm  DG Chest 2 View, methylPREDNISolone acetate (DEPO-MEDROL) injection 80 mg  3. Facial swelling  POCT CBC, POCT glucose (manual entry), Comprehensive metabolic panel  4. Diabetes mellitus  POCT glucose (manual entry)  5. Allergic reaction  methylPREDNISolone acetate (DEPO-MEDROL) injection 80 mg, predniSONE (DELTASONE) 20 MG tablet, EPINEPHrine (EPIPEN) 0.3 mg/0.3 mL DEVI    Please start taking Benadryl 7m every six hours PRN swelling, shortness of breath. Please start Zyrtec 159mone daily for allergic reaction To Emergency Department for acute worsening of shortness of breath, facial swelling, throat swelling

## 2012-02-27 LAB — COMPREHENSIVE METABOLIC PANEL
ALT: 60 U/L — ABNORMAL HIGH (ref 0–53)
Albumin: 4 g/dL (ref 3.5–5.2)
BUN: 16 mg/dL (ref 6–23)
CO2: 27 mEq/L (ref 19–32)
Calcium: 9.5 mg/dL (ref 8.4–10.5)
Chloride: 103 mEq/L (ref 96–112)
Creat: 0.99 mg/dL (ref 0.50–1.35)
Potassium: 4.4 mEq/L (ref 3.5–5.3)

## 2012-03-19 ENCOUNTER — Encounter: Payer: Self-pay | Admitting: Family Medicine

## 2012-03-24 ENCOUNTER — Other Ambulatory Visit: Payer: Self-pay | Admitting: Dermatology

## 2012-04-03 NOTE — Progress Notes (Signed)
Reviewed and agree.

## 2012-04-16 ENCOUNTER — Other Ambulatory Visit: Payer: Self-pay | Admitting: Family Medicine

## 2012-04-16 DIAGNOSIS — E119 Type 2 diabetes mellitus without complications: Secondary | ICD-10-CM

## 2012-04-16 MED ORDER — METFORMIN HCL 500 MG PO TABS: 1000.0000 mg | ORAL_TABLET | Freq: Every evening | ORAL | Status: DC

## 2012-04-19 ENCOUNTER — Ambulatory Visit (INDEPENDENT_AMBULATORY_CARE_PROVIDER_SITE_OTHER): Payer: 59 | Admitting: Emergency Medicine

## 2012-04-19 VITALS — BP 128/82 | HR 82 | Temp 98.0°F | Resp 20 | Ht 70.75 in | Wt 255.0 lb

## 2012-04-19 DIAGNOSIS — L0291 Cutaneous abscess, unspecified: Secondary | ICD-10-CM

## 2012-04-19 DIAGNOSIS — L0390 Cellulitis, unspecified: Secondary | ICD-10-CM

## 2012-04-19 DIAGNOSIS — G40209 Localization-related (focal) (partial) symptomatic epilepsy and epileptic syndromes with complex partial seizures, not intractable, without status epilepticus: Secondary | ICD-10-CM | POA: Insufficient documentation

## 2012-04-19 MED ORDER — DOXYCYCLINE HYCLATE 100 MG PO TABS: 100.0000 mg | ORAL_TABLET | Freq: Two times a day (BID) | ORAL | Status: DC

## 2012-04-19 NOTE — Progress Notes (Signed)
[facility] [address], [place] [address] [phone]  Date:  04/19/2012   Name:  [name] [name]   DOB:  08/03/1960   MRN:  [id number]  PCP:  [name], MD    Chief Complaint: possible seizure and boil near anus   History of Present Illness:  [name] [name] is a 51 y.o. very pleasant male patient who presents with the following:  Multiple problems.  Has a seizure disorder and yesterday was exiting a car and had a seizure and struck his head on the door frame. He has a persistent headache and some confusion.  These are typical following a seizure and don't represent a change.  Last seizure previously was three weeks ago.  Denies neurologic or visual symptoms.  Has a small abscess on left buttock that popped up yesterday says that he has a history of MRSA.  No fever or chills or other complaints other than pain and tenderness. No drainage.  Patient Active Problem List  Diagnosis  . HYPERLIPIDEMIA NEC/NOS  . HYPERTENSION, ESSENTIAL NOS  . EXTERNAL HEMORRHOIDS  . GERD  . CROHN'S DISEASE, LARGE AND SMALL INTESTINES  . OSTEOPOROSIS  . COUGH  . OTHER DYSPHAGIA  . TRANSAMINASES, SERUM, ELEVATED  . COLONIC POLYPS, ADENOMATOUS, HX OF  . Diabetes mellitus type II  . Altered mental status  . Complex partial seizure    Past Medical History  Diagnosis Date  . Hypertension   . GERD (gastroesophageal reflux disease)   . Adenomatous colon polyp 10/1999  . Osteoporosis   . External hemorrhoids   . Crohn's disease of small and large intestines 1999  . Pneumonia   . Esophageal stricture   . MRSA (methicillin resistant Staphylococcus aureus) 10/2010   . Diabetes mellitus   . Hyperlipemia   . Cancer     skin  . Kidney stones   . Sleep apnea     CPAP machine  . PONV (postoperative nausea and vomiting)   . Complex partial seizure   . Migraines   . Seizures     Past Surgical History  Procedure Date  . Shoulder surgery   . Sinus surgery with  instatrak   . Elbow surgery 2012    elbow MRSA infection     History  Substance Use Topics  . Smoking status: Never Smoker   . Smokeless tobacco: Never Used  . Alcohol Use: No    Family History  Problem Relation Age of Onset  . Colon polyps Father   . Heart disease Father   . Stroke Father   . Heart disease Mother   . Colon cancer Neg Hx     Allergies  Allergen Reactions  . Azithromycin Swelling  . Dilaudid (Hydromorphone Hcl) Other (See Comments)    n/v  . Penicillins Hives  . Sulfonamide Derivatives Hives    Medication list has been reviewed and updated.  Current Outpatient Prescriptions on File Prior to Visit  Medication Sig Dispense Refill  . budesonide (ENTOCORT EC) 3 MG 24 hr capsule Take 9 mg by mouth every morning. Take 3 capsules (9 mg total) daily      . CALCIUM PO Take 1 tablet by mouth 3 (three) times daily.       . Cyanocobalamin (VITAMIN B 12 PO) Take 1 tablet by mouth daily.      Marland Kitchen diltiazem (TIAZAC) 240 MG 24 hr capsule Take 240 mg by mouth daily.      Marland Kitchen esomeprazole (NEXIUM) 40 MG  capsule Take 40 mg by mouth daily before breakfast.      . folic acid (FOLVITE) 1 MG tablet Take 1 tablet (1 mg total) by mouth daily.  90 tablet  3  . glucose blood (CHOICE DM FORA G20 TEST STRIPS) test strip Use as instructed  100 each  12  . glucose blood (ONE TOUCH ULTRA TEST) test strip Use as instructed  100 each  0  . insulin glargine (LANTUS) 100 UNIT/ML injection Inject 40 Units into the skin every morning.      . Lancets (ONETOUCH ULTRASOFT) lancets Use as instructed  100 each  12  . levETIRAcetam (KEPPRA) 750 MG tablet Take 750 mg by mouth every 12 (twelve) hours.      . mercaptopurine (PURINETHOL) 50 MG tablet Take 100 mg by mouth daily. Take two tablets (100 mg total) daily. Give on an empty stomach 1 hour before or 2 hours after meals. Caution: Chemotherapy.      . mesalamine (ASACOL) 400 MG EC tablet Take 800 mg by mouth 3 (three) times daily. Take two tablets  (800 mg total) three times daily      . metFORMIN (GLUCOPHAGE) 500 MG tablet Take 2 tablets (1,000 mg total) by mouth every evening. Take two 500 mg (1000 mg total) in the evening with dinner  180 tablet  3  . methscopolamine (PAMINE FORTE) 5 MG tablet Take 5 mg by mouth 2 (two) times daily.      Marland Kitchen OVER THE COUNTER MEDICATION Taken Florastor OTC one tablet at night      . potassium chloride SA (K-DUR,KLOR-CON) 20 MEQ tablet Take 20 mEq by mouth 2 (two) times daily.      . Saccharomyces boulardii (FLORASTOR PO) Take 1 tablet by mouth daily.       . valsartan (DIOVAN) 160 MG tablet Take 160 mg by mouth daily.      Marland Kitchen EPINEPHrine (EPIPEN) 0.3 mg/0.3 mL DEVI Inject 0.3 mLs (0.3 mg total) into the muscle once.  1 Device  1  . folic acid (FOLVITE) 1 MG tablet Take 1 mg by mouth daily.      . Lactase (DAIRY-RELIEF PO) Take 3 tablets by mouth 3 (three) times daily.       . metoCLOPramide (REGLAN) 10 MG tablet Take 1 tablet (10 mg total) by mouth every 6 (six) hours.  30 tablet  0  . predniSONE (DELTASONE) 20 MG tablet 2 tablets daily x 5 days then 1 tablet daily x 5 days  15 tablet  0    Review of Systems:  As per HPI, otherwise negative.    Physical Examination: Filed Vitals:   04/19/12 0959  BP: 128/82  Pulse: 82  Temp: 98 F (36.7 C)  Resp: 20   Filed Vitals:   04/19/12 0959  Height: 5' 10.75" (1.797 m)  Weight: 255 lb (115.667 kg)   Body mass index is 35.82 kg/(m^2). Ideal Body Weight: Weight in (lb) to have BMI = 25: 177.6   GEN: WDWN, NAD, Non-toxic, A & O x 3, MAE, PRRERLA EOMI, Fundi benign.  NCAT HEENT: Atraumatic, Normocephalic. Neck supple. No masses, No LAD.  Oropharynx negative Ears and Nose: No external deformity.  TM negative CV: RRR, No M/G/R. No JVD. No thrill. No extra heart sounds. PULM: CTA B, no wheezes, crackles, rhonchi. No retractions. No resp. distress. No accessory muscle use. ABD: S, NT, ND, +BS. No rebound. No HSM. ANORECTAL:  1 cm diameter pustular lesion  on left buttock EXTR:  No c/c/e NEURO Normal gait.  PSYCH: Normally interactive. Conversant. Not depressed or anxious appearing.  Calm demeanor.    Assessment and Plan: Abscess Post ictal state Follow up if neuro status is not improved Septra ds culture  Roselee Culver, MD

## 2012-04-22 ENCOUNTER — Other Ambulatory Visit: Payer: Self-pay | Admitting: Family Medicine

## 2012-04-22 DIAGNOSIS — IMO0001 Reserved for inherently not codable concepts without codable children: Secondary | ICD-10-CM

## 2012-04-22 LAB — WOUND CULTURE
Gram Stain: NONE SEEN
Gram Stain: NONE SEEN
Organism ID, Bacteria: NO GROWTH

## 2012-04-22 MED ORDER — GLIMEPIRIDE 2 MG PO TABS: 2.0000 mg | ORAL_TABLET | Freq: Every day | ORAL | Status: DC

## 2012-04-22 NOTE — Progress Notes (Signed)
Notified pt that glimepiride Rx was sent to his pharmacy by Dr L. Pt thanked [place].

## 2012-04-27 NOTE — Progress Notes (Signed)
Reviewed and agree.

## 2012-05-04 ENCOUNTER — Ambulatory Visit: Payer: 59 | Admitting: Family Medicine

## 2012-05-04 VITALS — BP 113/81 | HR 93 | Temp 98.0°F | Resp 18 | Ht 70.2 in | Wt 255.2 lb

## 2012-05-04 DIAGNOSIS — L0291 Cutaneous abscess, unspecified: Secondary | ICD-10-CM

## 2012-05-04 MED ORDER — DOXYCYCLINE HYCLATE 100 MG PO TABS: 100.0000 mg | ORAL_TABLET | Freq: Two times a day (BID) | ORAL | Status: DC

## 2012-05-04 NOTE — Progress Notes (Signed)
Is a 51 year old man with diabetes and Crohn's disease who comes in with 3 days of progressive left scrotal pain and swelling. Today the swelling ruptured with bloody pus on his underwear. The pain is significantly less since the boil ruptured.  Objective: Half centimeter indurated round subcutaneous mass over left upper scrotum with no drainage. There is minimal erythema and mild surrounding swelling.  Assessment: 51 year old gentleman with immune compromise secondary meds, Crohn's disease, and diabetes. He has a history of MRSA and a presumed that this is MRSA as well. He's had great problems with clindamycin the past and even though he's had some resistance to doxycycline, I think this is the best way to go for now. I'm unable to culture the wound at this time but asked the patient to call me back in 48 hours if he is not getting better and to begin twice a day soapy water cleansing.

## 2012-05-19 ENCOUNTER — Other Ambulatory Visit: Payer: Self-pay | Admitting: *Deleted

## 2012-05-19 DIAGNOSIS — E119 Type 2 diabetes mellitus without complications: Secondary | ICD-10-CM

## 2012-05-19 MED ORDER — METFORMIN HCL 500 MG PO TABS: 1000.0000 mg | ORAL_TABLET | Freq: Every evening | ORAL | Status: DC

## 2012-05-19 MED ORDER — GLIMEPIRIDE 2 MG PO TABS: 2.0000 mg | ORAL_TABLET | Freq: Every day | ORAL | Status: DC

## 2012-07-24 ENCOUNTER — Ambulatory Visit: Payer: 59 | Admitting: Family Medicine

## 2012-07-24 ENCOUNTER — Other Ambulatory Visit: Payer: Self-pay | Admitting: Radiology

## 2012-07-24 ENCOUNTER — Ambulatory Visit: Payer: 59

## 2012-07-24 VITALS — BP 106/76 | HR 99 | Temp 98.0°F | Resp 17 | Ht 70.5 in | Wt 254.0 lb

## 2012-07-24 DIAGNOSIS — R062 Wheezing: Secondary | ICD-10-CM

## 2012-07-24 DIAGNOSIS — T7840XA Allergy, unspecified, initial encounter: Secondary | ICD-10-CM

## 2012-07-24 DIAGNOSIS — R0602 Shortness of breath: Secondary | ICD-10-CM

## 2012-07-24 DIAGNOSIS — E119 Type 2 diabetes mellitus without complications: Secondary | ICD-10-CM

## 2012-07-24 DIAGNOSIS — G40909 Epilepsy, unspecified, not intractable, without status epilepticus: Secondary | ICD-10-CM

## 2012-07-24 LAB — POCT CBC
Granulocyte percent: 70.4 %G (ref 37–80)
MCV: 100.4 fL — AB (ref 80–97)
MID (cbc): 0.8 (ref 0–0.9)
POC Granulocyte: 6.7 (ref 2–6.9)
Platelet Count, POC: 278 10*3/uL (ref 142–424)
RBC: 4.54 M/uL — AB (ref 4.69–6.13)
RDW, POC: 16.8 %

## 2012-07-24 LAB — GLUCOSE, POCT (MANUAL RESULT ENTRY): POC Glucose: 87 mg/dl (ref 70–99)

## 2012-07-24 LAB — POCT GLYCOSYLATED HEMOGLOBIN (HGB A1C): Hemoglobin A1C: 7.5

## 2012-07-24 MED ORDER — METHYLPREDNISOLONE SODIUM SUCC 125 MG IJ SOLR
125.0000 mg | Freq: Once | INTRAMUSCULAR | Status: AC
  Administered 2012-07-24: 125 mg via INTRAMUSCULAR

## 2012-07-24 MED ORDER — PREDNISONE 20 MG PO TABS: ORAL_TABLET | ORAL | Status: DC

## 2012-07-24 NOTE — Patient Instructions (Addendum)
Do not take your diovan for the next several days- this could possibly be your allergic trigger.  If your BP starts to get high please call me and we can think of an alternative medicaiton Continue to use benadryl and claritin.  If you start to have any recurrent wheezing, swelling or difficulty breathing use your epipen and call EMS.  If your blood sugar gets excessively high please call me

## 2012-07-24 NOTE — Progress Notes (Signed)
[facility] [address], [place] [address] [phone]  Date:  07/24/2012   Name:  [name] [name]   DOB:  April 15, 1961   MRN:  [id number]  PCP:  [name], MD    Chief Complaint: Chest Pain   History of Present Illness:  [name] [name] is a 52 y.o. very pleasant male patient who presents with the following:  Gentleman with a history of seizure disorder, high cholesterol, HTN, IDDM, Crohn's disease.   He work up at 0600 this am with wheezing, difficulty breathing and upper chest pain, feeling dizzy.  He felt as though "everyrhing was swollen" and he could not breathe.   He has had a couple of "unexplained allergic reactions" in the past.  He has not yet seen an allergist however.   (see note from 02/26/12, he presented with SOB and facial swelling/ wheezing, treated with depo- medrol.  He states his symptoms today are not as severe but he was afraid he was heading in that direction.) He has not noted any recent illness or fever. He took benadryl at 0630- he is not sure of the exact dosage as it was liquid.  It did seem to help a lot He feels as though the wheezing is coming back now however He does not note swelling of his lips of tongue. No facial swelling Swallowing was very difficult when his symptoms were worst this am- now better His glucose was good this am- around 100.  No recent A1c He has increased his dose of seizure medication as of late- no other new medications.   He does have an epipen and "was close to using it this morning."    He takes prednisone off an on for his Crohn's disease.  He usually tolerates this fairly well   Patient Active Problem List  Diagnosis  . HYPERLIPIDEMIA NEC/NOS  . HYPERTENSION, ESSENTIAL NOS  . EXTERNAL HEMORRHOIDS  . GERD  . CROHN'S DISEASE, LARGE AND SMALL INTESTINES  . OSTEOPOROSIS  . COUGH  . OTHER DYSPHAGIA  . TRANSAMINASES, SERUM, ELEVATED  . COLONIC POLYPS, ADENOMATOUS, HX OF  . Diabetes mellitus  type II  . Altered mental status  . Complex partial seizure    Past Medical History  Diagnosis Date  . Hypertension   . GERD (gastroesophageal reflux disease)   . Adenomatous colon polyp 10/1999  . Osteoporosis   . External hemorrhoids   . Crohn's disease of small and large intestines 1999  . Pneumonia   . Esophageal stricture   . MRSA (methicillin resistant Staphylococcus aureus) 10/2010   . Diabetes mellitus   . Hyperlipemia   . Cancer     skin  . Kidney stones   . Sleep apnea     CPAP machine  . PONV (postoperative nausea and vomiting)   . Complex partial seizure   . Migraines   . Seizures   . Cataract     Past Surgical History  Procedure Date  . Shoulder surgery   . Sinus surgery with instatrak   . Elbow surgery 2012    elbow MRSA infection   . Eye surgery     History  Substance Use Topics  . Smoking status: Never Smoker   . Smokeless tobacco: Never Used  . Alcohol Use: No    Family History  Problem Relation Age of Onset  . Colon polyps Father   . Heart disease Father   . Stroke Father   . Heart disease Mother   .  Colon cancer Neg Hx   . Stroke Mother   . Stroke Paternal Grandmother   . Stroke Paternal Grandfather     Allergies  Allergen Reactions  . Azithromycin Swelling  . Dilaudid (Hydromorphone Hcl) Other (See Comments)    n/v  . Penicillins Hives  . Sulfonamide Derivatives Hives    Medication list has been reviewed and updated.  Current Outpatient Prescriptions on File Prior to Visit  Medication Sig Dispense Refill  . budesonide (ENTOCORT EC) 3 MG 24 hr capsule Take 9 mg by mouth every morning. Take 3 capsules (9 mg total) daily      . CALCIUM PO Take 1 tablet by mouth 3 (three) times daily.       . Cyanocobalamin (VITAMIN B 12 PO) Take 1 tablet by mouth daily.      Marland Kitchen diltiazem (TIAZAC) 240 MG 24 hr capsule Take 240 mg by mouth daily.      Marland Kitchen doxycycline (VIBRA-TABS) 100 MG tablet Take 1 tablet (100 mg total) by mouth 2 (two) times  daily.  20 tablet  0  . EPINEPHrine (EPIPEN) 0.3 mg/0.3 mL DEVI Inject 0.3 mLs (0.3 mg total) into the muscle once.  1 Device  1  . esomeprazole (NEXIUM) 40 MG capsule Take 40 mg by mouth daily before breakfast.      . folic acid (FOLVITE) 1 MG tablet Take 1 tablet (1 mg total) by mouth daily.  90 tablet  3  . folic acid (FOLVITE) 1 MG tablet Take 1 mg by mouth daily.      Marland Kitchen glimepiride (AMARYL) 2 MG tablet Take 1 tablet (2 mg total) by mouth daily before breakfast.  90 tablet  0  . glucose blood (CHOICE DM FORA G20 TEST STRIPS) test strip Use as instructed  100 each  12  . glucose blood (ONE TOUCH ULTRA TEST) test strip Use as instructed  100 each  0  . insulin glargine (LANTUS) 100 UNIT/ML injection Inject 40 Units into the skin every morning.      . Lactase (DAIRY-RELIEF PO) Take 3 tablets by mouth 3 (three) times daily.       . Lancets (ONETOUCH ULTRASOFT) lancets Use as instructed  100 each  12  . levETIRAcetam (KEPPRA) 750 MG tablet Take 750 mg by mouth every 12 (twelve) hours.      . mercaptopurine (PURINETHOL) 50 MG tablet Take 100 mg by mouth daily. Take two tablets (100 mg total) daily. Give on an empty stomach 1 hour before or 2 hours after meals. Caution: Chemotherapy.      . mesalamine (ASACOL) 400 MG EC tablet Take 800 mg by mouth 3 (three) times daily. Take two tablets (800 mg total) three times daily      . metFORMIN (GLUCOPHAGE) 500 MG tablet Take 2 tablets (1,000 mg total) by mouth every evening. Take two 500 mg (1000 mg total) in the evening with dinner  180 tablet  0  . methscopolamine (PAMINE FORTE) 5 MG tablet Take 5 mg by mouth 2 (two) times daily.      Marland Kitchen OVER THE COUNTER MEDICATION Taken Florastor OTC one tablet at night      . potassium chloride SA (K-DUR,KLOR-CON) 20 MEQ tablet Take 20 mEq by mouth 2 (two) times daily.      . predniSONE (DELTASONE) 20 MG tablet 2 tablets daily x 5 days then 1 tablet daily x 5 days  15 tablet  0  . Saccharomyces boulardii (FLORASTOR PO)  Take 1 tablet by  mouth daily.       . valsartan (DIOVAN) 160 MG tablet Take 160 mg by mouth daily.      . metoCLOPramide (REGLAN) 10 MG tablet Take 1 tablet (10 mg total) by mouth every 6 (six) hours.  30 tablet  0    Review of Systems:  As per HPI- otherwise negative.   Physical Examination: Filed Vitals:   07/24/12 0822  BP: 106/76  Pulse: 99  Temp: 98 F (36.7 C)  Resp: 17   Filed Vitals:   07/24/12 0822  Height: 5' 10.5" (1.791 m)  Weight: 254 lb (115.214 kg)   Body mass index is 35.93 kg/(m^2). Ideal Body Weight: Weight in (lb) to have BMI = 25: 176.4   GEN: WDWN, NAD, Non-toxic, A & O x 3, obese, no current distress/ sweating/ pallor or wheezing HEENT: Atraumatic, Normocephalic. Neck supple. No masses, No LAD. Bilateral TM wnl, oropharynx normal.  PEERL,EOMI.   - no current angioedema visible, no facial swelling Ears and Nose: No external deformity. CV: RRR, No M/G/R. No JVD. No thrill. No extra heart sounds. PULM: CTA B, no wheezes, crackles, rhonchi. No retractions. No resp. distress. No accessory muscle use. ABD: S, NT, ND. No rebound. No HSM. EXTR: No c/c/e NEURO Normal gait.  PSYCH: Normally interactive. Conversant. Not depressed or anxious appearing.  Calm demeanor.   Results for orders placed in visit on 07/24/12  POCT CBC      Component Value Range   WBC 9.5  4.6 - 10.2 K/uL   Lymph, poc 2.0  0.6 - 3.4   POC LYMPH PERCENT 21.1  10 - 50 %L   MID (cbc) 0.8  0 - 0.9   POC MID % 8.5  0 - 12 %M   POC Granulocyte 6.7  2 - 6.9   Granulocyte percent 70.4  37 - 80 %G   RBC 4.54 (*) 4.69 - 6.13 M/uL   Hemoglobin 14.8  14.1 - 18.1 g/dL   HCT, POC 45.6  43.5 - 53.7 %   MCV 100.4 (*) 80 - 97 fL   MCH, POC 32.6 (*) 27 - 31.2 pg   MCHC 32.5  31.8 - 35.4 g/dL   RDW, POC 16.8     Platelet Count, POC 278  142 - 424 K/uL   MPV 9.0  0 - 99.8 fL  GLUCOSE, POCT (MANUAL RESULT ENTRY)      Component Value Range   POC Glucose 87  70 - 99 mg/dl  POCT GLYCOSYLATED  HEMOGLOBIN (HGB A1C)      Component Value Range   Hemoglobin A1C 7.5     EKG: compared to old EKG.    SR with Q waves in III- stable from 07/01/2011 and earlier, no concerning acute ST elevation or depression He was seen by [facility] in 2012 and had a normal nuclear stress test.  He feels that the CP he had was located in the upper part of his neck/ airway, not substernal pain.    [facility] reading (PRIMARY) by  Dr. Lorelei Pont. CXR: cardiomegaly and low lung volumes. Compared to 2013 no significant change notes Soft tissue neck:  Normal NECK SOFT TISSUES - 1+ VIEW  Comparison: None.  Findings: There is no evidence of retropharyngeal soft tissue swelling or epiglottic enlargement. The cervical airway is unremarkable and no radio-opaque foreign body identified. Mild spurring is noted in the lower cervical spine.  IMPRESSION: Negative.  CHEST - 2 VIEW  Comparison: February 26, 2012  Findings: There is  slight scarring in the right and left lung bases. There is no edema or consolidation. Heart is upper normal in size with normal pulmonary vascularity. No adenopathy. No bone lesions.  IMPRESSION: No edema or consolidation   Given 125 of solu- medrol today.  Discussed using this medication vs depo- medrol and he preferred solu- medrol due to speed of onset.  He is aware it can cause elevation of his glucose which may be unpredictable.   Repeated exam prior to his leaving clinic- no angioedema, no wheezing.  He was observed in clinic for about 90 minutes.  No rash or urticaria   Assessment and Plan: 1. Wheezing  DG Chest 2 View, DG Neck Soft Tissue  2. Seizure disorder    3. SOB (shortness of breath)  POCT CBC, EKG 12-Lead  4. Diabetes mellitus, type 2  POCT glucose (manual entry), POCT glycosylated hemoglobin (Hb A1C)  5. Allergic reaction  Ambulatory referral to Allergy, methylPREDNISolone sodium succinate (SOLU-MEDROL) 125 mg/2 mL injection 125 mg, DISCONTINUED: predniSONE (DELTASONE) 20 MG  tablet   Ifeanyichukwu is here with recurrent allergic reaction symptoms.  He has used benadryl at home and will continue to use this as needed. he is also taking claritin daily.  Will refer him to allergy clinic for testing.  Will use a po steroid taper as well- to start tomorrow.   Will stop diovan for the next several days- this could possibly be his allergic trigger. If his BP starts to go up he will call me and we will think of an alternative.  However, his BP is actually quite low now so should not be a problem to stop at least temporarily.    Brittony Billick, MD Called him at 5:30 pm to check on him.  No respiratory distress, but his glucose was up to 375.  Advised him to increase his lantus to 45 units (from usual 40), and to drink plenty of fluids and watch his diet.  He feels as through he might soon start to have wheezing again.  Encouraged him to go to the ED if he develops any significant symptoms again.  He agreed

## 2012-07-24 NOTE — Telephone Encounter (Signed)
Pharmacy called about the prednisone 20 mg, quantity is not correct, does not match sig, pended Rx. Cancelled old one at pharmacy advised we will resubmit. Changed quantity from 9 to 15, just want you to double check and make sure this is correct dose, before we resubmit. Amy

## 2012-07-25 ENCOUNTER — Telehealth: Payer: Self-pay | Admitting: Family Medicine

## 2012-07-25 NOTE — Telephone Encounter (Signed)
Called to check on him- he feels well breathing wise but his glucose is 450.  He is pushing fluids, and does not fell ill.  He took 48 units of lantus this morning.  Advised him to continue to push fluids and to let me know if he gets any higher or starts to feel ill. He may take another 5 units of lantus if needed this afternoon. Suspect his glucose will settle down in the next day or so.  He will take just 20 mg of prednisone instead of 40.

## 2012-07-26 ENCOUNTER — Ambulatory Visit: Payer: 59 | Admitting: Family Medicine

## 2012-07-26 VITALS — BP 123/79 | HR 93 | Temp 98.2°F | Resp 18 | Ht 71.0 in | Wt 250.0 lb

## 2012-07-26 DIAGNOSIS — R7309 Other abnormal glucose: Secondary | ICD-10-CM

## 2012-07-26 DIAGNOSIS — R739 Hyperglycemia, unspecified: Secondary | ICD-10-CM

## 2012-07-26 DIAGNOSIS — E119 Type 2 diabetes mellitus without complications: Secondary | ICD-10-CM

## 2012-07-26 LAB — BASIC METABOLIC PANEL
BUN: 30 mg/dL — ABNORMAL HIGH (ref 6–23)
Calcium: 9.3 mg/dL (ref 8.4–10.5)
Creat: 1 mg/dL (ref 0.50–1.35)
Glucose, Bld: 152 mg/dL — ABNORMAL HIGH (ref 70–99)
Potassium: 4.2 mEq/L (ref 3.5–5.3)

## 2012-07-26 LAB — POCT URINALYSIS DIPSTICK
Glucose, UA: 100
Nitrite, UA: NEGATIVE
Protein, UA: 30
Spec Grav, UA: >=1.03
Urobilinogen, UA: 0.2
pH, UA: 5.5

## 2012-07-26 LAB — GLUCOSE, POCT (MANUAL RESULT ENTRY): POC Glucose: 148 mg/dl — AB (ref 70–99)

## 2012-07-26 NOTE — Progress Notes (Signed)
[facility] [address] [place] [address] [phone]  Date:  07/26/2012   Name:  [name] [name]   DOB:  23-Jan-1961   MRN:  [id number]  PCP:  [name], MD    Chief Complaint: Follow-up   History of Present Illness:  [name] [name] is a 52 y.o. very pleasant male patient who presents with the following:  He was here on 07/24/12 with possible allergic reaction.  He has noted wheezing, SOB and was afraid he would need to use his epipen.  He was treated with solumedrol and his allergic reaction symptoms eased- however, he has noted high blood glucose since he received the solumedrol (he uses lantus as well as glimperide and metformin for IDDN).  He has been drinking plenty of H2O.   He notes "cotton mouth," but his Crohn's symptoms are quiet right now.  His allergic reaction symptoms are now gone.  No facial swelling, no wheezing, just slight SOB.   He checked his glucose this am at home- it was 310.  He did not eat much yesterday however.  No breakfast yet this am. He has not taken any of his meds yet today either except he did take 45 units of lantus Patient Active Problem List  Diagnosis  . HYPERLIPIDEMIA NEC/NOS  . HYPERTENSION, ESSENTIAL NOS  . EXTERNAL HEMORRHOIDS  . GERD  . CROHN'S DISEASE, LARGE AND SMALL INTESTINES  . OSTEOPOROSIS  . COUGH  . OTHER DYSPHAGIA  . TRANSAMINASES, SERUM, ELEVATED  . COLONIC POLYPS, ADENOMATOUS, HX OF  . Diabetes mellitus type II  . Altered mental status  . Complex partial seizure    Past Medical History  Diagnosis Date  . Hypertension   . GERD (gastroesophageal reflux disease)   . Adenomatous colon polyp 10/1999  . Osteoporosis   . External hemorrhoids   . Crohn's disease of small and large intestines 1999  . Pneumonia   . Esophageal stricture   . MRSA (methicillin resistant Staphylococcus aureus) 10/2010   . Diabetes mellitus   . Hyperlipemia   . Cancer     skin  . Kidney stones   . Sleep apnea     CPAP machine  . PONV (postoperative nausea and vomiting)   . Complex partial seizure   . Migraines   . Seizures   . Cataract     Past Surgical History  Procedure Date  . Shoulder surgery   . Sinus surgery with instatrak   . Elbow surgery 2012    elbow MRSA infection   . Eye surgery     History  Substance Use Topics  . Smoking status: Never Smoker   . Smokeless tobacco: Never Used  . Alcohol Use: No    Family History  Problem Relation Age of Onset  . Colon polyps Father   . Heart disease Father   . Stroke Father   . Heart disease Mother   . Colon cancer Neg Hx   . Stroke Mother   . Stroke Paternal Grandmother   . Stroke Paternal Grandfather     Allergies  Allergen Reactions  . Azithromycin Swelling  . Dilaudid (Hydromorphone Hcl) Other (See Comments)    n/v  . Penicillins Hives  . Sulfonamide Derivatives Hives    Medication list has been reviewed and updated.  Current Outpatient Prescriptions on File Prior to Visit  Medication Sig Dispense Refill  . budesonide (ENTOCORT EC) 3 MG 24 hr capsule Take 9 mg by mouth every morning. Take  3 capsules (9 mg total) daily      . CALCIUM PO Take 1 tablet by mouth 3 (three) times daily.       . Cyanocobalamin (VITAMIN B 12 PO) Take 1 tablet by mouth daily.      Marland Kitchen diltiazem (TIAZAC) 240 MG 24 hr capsule Take 240 mg by mouth daily.      Marland Kitchen EPINEPHrine (EPIPEN) 0.3 mg/0.3 mL DEVI Inject 0.3 mLs (0.3 mg total) into the muscle once.  1 Device  1  . esomeprazole (NEXIUM) 40 MG capsule Take 40 mg by mouth daily before breakfast.      . folic acid (FOLVITE) 1 MG tablet Take 1 tablet (1 mg total) by mouth daily.  90 tablet  3  . glimepiride (AMARYL) 2 MG tablet Take 1 tablet (2 mg total) by mouth daily before breakfast.  90 tablet  0  . glucose blood (CHOICE DM FORA G20 TEST STRIPS) test strip Use as instructed  100 each  12  . glucose blood (ONE TOUCH ULTRA TEST) test strip Use as instructed  100 each  0  . insulin glargine  (LANTUS) 100 UNIT/ML injection Inject 40 Units into the skin every morning.      . Lactase (DAIRY-RELIEF PO) Take 3 tablets by mouth 3 (three) times daily.       . Lancets (ONETOUCH ULTRASOFT) lancets Use as instructed  100 each  12  . levETIRAcetam (KEPPRA) 750 MG tablet Take 750 mg by mouth every 12 (twelve) hours.      . mercaptopurine (PURINETHOL) 50 MG tablet Take 100 mg by mouth daily. Take two tablets (100 mg total) daily. Give on an empty stomach 1 hour before or 2 hours after meals. Caution: Chemotherapy.      . mesalamine (ASACOL) 400 MG EC tablet Take 800 mg by mouth 3 (three) times daily. Take two tablets (800 mg total) three times daily      . metFORMIN (GLUCOPHAGE) 500 MG tablet Take 2 tablets (1,000 mg total) by mouth every evening. Take two 500 mg (1000 mg total) in the evening with dinner  180 tablet  0  . methscopolamine (PAMINE FORTE) 5 MG tablet Take 5 mg by mouth 2 (two) times daily.      Marland Kitchen OVER THE COUNTER MEDICATION Taken Florastor OTC one tablet at night      . potassium chloride SA (K-DUR,KLOR-CON) 20 MEQ tablet Take 20 mEq by mouth 2 (two) times daily.      . predniSONE (DELTASONE) 20 MG tablet Take 2 pills daily for 5 days, then 1 pill daily for 5 days  15 tablet  0  . Saccharomyces boulardii (FLORASTOR PO) Take 1 tablet by mouth daily.       . folic acid (FOLVITE) 1 MG tablet Take 1 mg by mouth daily.      . metoCLOPramide (REGLAN) 10 MG tablet Take 1 tablet (10 mg total) by mouth every 6 (six) hours.  30 tablet  0  . valsartan (DIOVAN) 160 MG tablet Take 160 mg by mouth daily.        Review of Systems:  As per HPI- otherwise negative.   Physical Examination: Filed Vitals:   07/26/12 0839  BP: 123/79  Pulse: 93  Temp: 98.2 F (36.8 C)  Resp: 18   Filed Vitals:   07/26/12 0839  Height: 5' 11"  (1.803 m)  Weight: 250 lb (113.399 kg)   Body mass index is 34.87 kg/(m^2). Ideal Body Weight: Weight in (lb) to  have BMI = 25: 178.9   GEN: WDWN, NAD,  Non-toxic, A & O x 3 HEENT: Atraumatic, Normocephalic. Neck supple. No masses, No LAD. Ears and Nose: No external deformity. CV: RRR, No M/G/R. No JVD. No thrill. No extra heart sounds. PULM: CTA B, no wheezes, crackles, rhonchi. No retractions. No resp. distress. No accessory muscle use. ABD: S, NT, ND, +BS. No rebound. No HSM. EXTR: No c/c/e NEURO Normal gait.  PSYCH: Normally interactive. Conversant. Not depressed or anxious appearing.  Calm demeanor.   Results for orders placed in visit on 07/26/12  GLUCOSE, POCT (MANUAL RESULT ENTRY)      Component Value Range   POC Glucose 148 (*) 70 - 99 mg/dl  POCT URINALYSIS DIPSTICK      Component Value Range   Color, UA orange     Clarity, UA clear     Glucose, UA 100     Bilirubin, UA neg     Ketones, UA trace     Spec Grav, UA >=1.030     Blood, UA trace-intact     pH, UA 5.5     Protein, UA 30     Urobilinogen, UA 0.2     Nitrite, UA neg     Leukocytes, UA Negative      Assessment and Plan: 1. Hyperglycemia  POCT glucose (manual entry), POCT urinalysis dipstick, Basic metabolic panel, Basic metabolic panel  2. IDDM (insulin dependent diabetes mellitus)     Elmus's hyperglycemia is now better- await his BMP which should be back ina few hours, will call him.  He is reassured and feeling a bit better.  He will let me know if his glucose gets very high again.   Lamar Blinks, MD

## 2012-07-26 NOTE — Patient Instructions (Addendum)
I will call you regarding your labs when they come in later today. Keep an eye on your sugars.  You may want to take just 20 mg of prednisone for about 5 days, but if your glucose is running quite high you can avoid the steroid completely   Your BP looks ok- we do not need to start another BP medication at this time

## 2012-08-07 ENCOUNTER — Other Ambulatory Visit: Payer: Self-pay | Admitting: Internal Medicine

## 2012-08-12 ENCOUNTER — Encounter: Payer: Self-pay | Admitting: Gastroenterology

## 2012-08-12 ENCOUNTER — Ambulatory Visit (INDEPENDENT_AMBULATORY_CARE_PROVIDER_SITE_OTHER): Payer: 59 | Admitting: Gastroenterology

## 2012-08-12 ENCOUNTER — Other Ambulatory Visit (INDEPENDENT_AMBULATORY_CARE_PROVIDER_SITE_OTHER): Payer: 59

## 2012-08-12 VITALS — BP 124/84 | HR 100 | Ht 70.25 in | Wt 258.4 lb

## 2012-08-12 DIAGNOSIS — K5080 Crohn's disease of both small and large intestine without complications: Secondary | ICD-10-CM

## 2012-08-12 DIAGNOSIS — R197 Diarrhea, unspecified: Secondary | ICD-10-CM

## 2012-08-12 DIAGNOSIS — K219 Gastro-esophageal reflux disease without esophagitis: Secondary | ICD-10-CM

## 2012-08-12 LAB — HEPATIC FUNCTION PANEL
ALT: 53 U/L (ref 0–53)
Alkaline Phosphatase: 66 U/L (ref 39–117)
Bilirubin, Direct: 0.1 mg/dL (ref 0.0–0.3)
Total Protein: 6.8 g/dL (ref 6.0–8.3)

## 2012-08-12 LAB — CBC WITH DIFFERENTIAL/PLATELET
Basophils Relative: 0.6 % (ref 0.0–3.0)
Eosinophils Relative: 0.4 % (ref 0.0–5.0)
Hemoglobin: 14.6 g/dL (ref 13.0–17.0)
Lymphocytes Relative: 23.6 % (ref 12.0–46.0)
MCV: 98.4 fl (ref 78.0–100.0)
Monocytes Absolute: 0.5 10*3/uL (ref 0.1–1.0)
Neutrophils Relative %: 69.4 % (ref 43.0–77.0)
RBC: 4.42 Mil/uL (ref 4.22–5.81)
WBC: 7.7 10*3/uL (ref 4.5–10.5)

## 2012-08-12 MED ORDER — MESALAMINE 400 MG PO TBEC: 800.0000 mg | DELAYED_RELEASE_TABLET | Freq: Three times a day (TID) | ORAL | Status: DC

## 2012-08-12 MED ORDER — BUDESONIDE 3 MG PO CP24: 9.0000 mg | ORAL_CAPSULE | ORAL | Status: DC

## 2012-08-12 MED ORDER — ESOMEPRAZOLE MAGNESIUM 40 MG PO CPDR: 40.0000 mg | DELAYED_RELEASE_CAPSULE | Freq: Every day | ORAL | Status: DC

## 2012-08-12 MED ORDER — MERCAPTOPURINE 50 MG PO TABS: 100.0000 mg | ORAL_TABLET | Freq: Every day | ORAL | Status: DC

## 2012-08-12 MED ORDER — METHSCOPOLAMINE BROMIDE 5 MG PO TABS: 5.0000 mg | ORAL_TABLET | Freq: Two times a day (BID) | ORAL | Status: DC

## 2012-08-12 NOTE — Progress Notes (Signed)
History of Present Illness: This is a 52 year old male returning for followup of Crohn's ileocolitis. He states he continues to have 2-5 loose bowel movements daily. He relates the diarrhea may have slightly worsened in the smell has definitely worsened since beginning metformin about one year ago. He has mild lower abdominal discomfort. His gastrointestinal symptoms appear to be stable.  Current Medications, Allergies, Past Medical History, Past Surgical History, Family History and Social History were reviewed in [employer] record.  Physical Exam: General: Well developed , well nourished, no acute distress Head: Normocephalic and atraumatic Eyes:  sclerae anicteric, EOMI Ears: Normal auditory acuity Mouth: No deformity or lesions Lungs: Clear throughout to auscultation Heart: Regular rate and rhythm; no murmurs, rubs or bruits Abdomen: Soft, non tender and non distended. No masses, hepatosplenomegaly or hernias noted. Normal Bowel sounds Musculoskeletal: Symmetrical with no gross deformities  Pulses:  Normal pulses noted Extremities: No clubbing, cyanosis, edema or deformities noted Neurological: Alert oriented x 4, grossly nonfocal Psychological:  Alert and cooperative. Normal mood and affect  Assessment and Recommendations:  1. Crohn's ileocolitis. Obtain a CBC, LFTs and lipase today. Recent BMET reviewed. Refill 6-mercaptopurine, budesonide, Pamine and 5-ASA. Consider a trial of decreasing or discontinuing metformin to see if this impacts his diarrhea or malodorous stool. Return office visit 6 months.   2. Personal history of adenomatous colon polyps. Surveillance colonoscopy due August 2014.   3. GERD. Continue standard antireflux measures. Refill Nexium.

## 2012-08-12 NOTE — Patient Instructions (Addendum)
Your physician has requested that you go to the basement for the following lab work before leaving today: CBC, LFT's, and Lipase.  [facility] day [facility] and budesonide was sent also for a 30 day supply to [facility].   Thank you for choosing me and [facility].  Pricilla Riffle. Dagoberto Ligas., MD., Marval Regal

## 2012-08-17 ENCOUNTER — Other Ambulatory Visit: Payer: Self-pay | Admitting: Physician Assistant

## 2012-08-17 ENCOUNTER — Telehealth: Payer: Self-pay | Admitting: Gastroenterology

## 2012-08-17 ENCOUNTER — Telehealth: Payer: Self-pay

## 2012-08-17 MED ORDER — MESALAMINE 800 MG PO TBEC: 1.0000 | DELAYED_RELEASE_TABLET | Freq: Three times a day (TID) | ORAL | Status: DC

## 2012-08-17 NOTE — Telephone Encounter (Signed)
Returned [employer] call and they state Asacol 475m is no longer available but Delzicol is on his formulary for a cheaper copay. She did check to make sure the copay was around the same price as what the patient was paying now and the pharmacist states that it will not give her an exact total but a estimated total is 50 dollars. Told her to send Delzicol in it's place at the same dosage because it is Asacol's equivalent. Informed Dr. SFuller Planand he agreed.

## 2012-08-17 NOTE — Telephone Encounter (Signed)
Per Dr. Fuller Plan patient can be switched to Asacol HD 1 tablets by mouth three times a day in the place of Asacol 400 or Delzicol. New Rx faxed to [facility].

## 2012-08-24 ENCOUNTER — Telehealth: Payer: Self-pay | Admitting: Gastroenterology

## 2012-08-24 NOTE — Telephone Encounter (Signed)
Left a message for patient to return my call. 

## 2012-08-24 NOTE — Telephone Encounter (Signed)
Explained to patient that he is getting the equivalent to Asacol 400 mg tablets just less pills he has to take a day. Pt understands now and will call back if any problems with Asacol 800 mg.

## 2012-08-24 NOTE — Telephone Encounter (Signed)
Called patient with no answer and no voicemail. 

## 2012-08-31 ENCOUNTER — Telehealth: Payer: Self-pay | Admitting: Gastroenterology

## 2012-08-31 NOTE — Telephone Encounter (Signed)
Patient states he has received his prescription in the mail and he was originally taking Asacol 800 mg tablets and not 498m. Pt was concerned because his Crohn's has been under control and he wants to make sure he does not have a flare up. Told patient according to our computer system he has always on been on Asacol 4052m2 tablets which is equal to 80066mhree times a day. Also at his last office visit patient told us [place] was on 400m49mblets. Pt states he was mistaken.  Patient states he has proof from his mail order pharmacy that he has been on 800mg84mhree times a day for a long time. I looked through our records and have spoke to the mail order pharmacy which both say that the patient  has been on Asacol 400mg 6mets and was just recently switched to the Ascol HD 800mg t30mts. Told patient to fax me the information that he has that will prove that he has been on Asacol HD. Pt agreed and I told him that I will send this note to Dr. Stark jFuller [place] he is aware.

## 2012-09-01 MED ORDER — MESALAMINE 800 MG PO TBEC: 2.0000 | DELAYED_RELEASE_TABLET | Freq: Three times a day (TID) | ORAL | Status: DC

## 2012-09-01 NOTE — Telephone Encounter (Signed)
Patient called back and states he will fax the copy of the mail order records stating how patient was taking his Asacol medicine.

## 2012-09-01 NOTE — Telephone Encounter (Signed)
Received fax that is pictures of [name] [name]'s Asacol bottles stating he has been on Asacol HD 870m 3 three times a day. Dr. SFuller Planreviewed and told me to tell the patient to stay on that dose. Left a message for patient to call me back.

## 2012-09-01 NOTE — Telephone Encounter (Signed)
Told patient since his insurance probably would not pay for him to have another prescription sent to him. I would leave as many samples up front for patient that we have at the moment and to call me back when he runs out so he can equal to the amount he needs to be taking every day. Pt agreed and will come by Friday to pick up samples. Pt agreed to call back for more samples when he runs out of Asacol HD samples.

## 2012-09-01 NOTE — Telephone Encounter (Signed)
Left a message for patient to return my call and also left the fax number on his voicemail to fax those records to me.

## 2012-09-10 ENCOUNTER — Encounter: Payer: Self-pay | Admitting: Gastroenterology

## 2012-09-29 ENCOUNTER — Ambulatory Visit: Payer: 59

## 2012-09-29 ENCOUNTER — Ambulatory Visit: Payer: 59 | Admitting: Family Medicine

## 2012-09-29 VITALS — BP 126/74 | HR 98 | Temp 99.2°F | Resp 17 | Ht 70.0 in | Wt 256.0 lb

## 2012-09-29 DIAGNOSIS — K921 Melena: Secondary | ICD-10-CM

## 2012-09-29 DIAGNOSIS — R109 Unspecified abdominal pain: Secondary | ICD-10-CM

## 2012-09-29 DIAGNOSIS — K5090 Crohn's disease, unspecified, without complications: Secondary | ICD-10-CM

## 2012-09-29 LAB — POCT CBC
Granulocyte percent: 74.4 %G (ref 37–80)
HCT, POC: 46.7 % (ref 43.5–53.7)
Hemoglobin: 15 g/dL (ref 14.1–18.1)
Lymph, poc: 1.6 (ref 0.6–3.4)
MCH, POC: 32.5 pg — AB (ref 27–31.2)
MCHC: 32.1 g/dL (ref 31.8–35.4)
MCV: 101.2 fL — AB (ref 80–97)
MID (cbc): 0.9 (ref 0–0.9)
MPV: 9.5 fL (ref 0–99.8)
POC Granulocyte: 7.1 — AB (ref 2–6.9)
POC LYMPH PERCENT: 16.6 % (ref 10–50)
POC MID %: 9 %M (ref 0–12)
Platelet Count, POC: 413 10*3/uL (ref 142–424)
RBC: 4.61 M/uL — AB (ref 4.69–6.13)
RDW, POC: 17 %
WBC: 9.6 10*3/uL (ref 4.6–10.2)

## 2012-09-29 LAB — POCT SEDIMENTATION RATE: POCT SED RATE: 32 mm/hr — AB (ref 0–22)

## 2012-09-29 MED ORDER — HYDROCODONE-ACETAMINOPHEN 5-325 MG PO TABS: 1.0000 | ORAL_TABLET | Freq: Four times a day (QID) | ORAL | Status: DC | PRN

## 2012-09-29 MED ORDER — PREDNISONE 10 MG PO TABS: ORAL_TABLET | ORAL | Status: DC

## 2012-09-29 NOTE — Progress Notes (Signed)
 [facility]:  Office Visit  Chief Complaint:  Chief Complaint  Patient presents with  . Abdominal Pain    history crohn    HPI: [name] [name] is a 52 y.o. male who complains of horrific diffuse abdominal pain, more blood in his stools. Was dx with Crohn's at age 28. Midepigastric pain from mid throat down to midabdominal. Tried plain foods without relief. This usually works for his crohns. Has had this for 10 days and gradually getting worse. No fevers, chills, no nausea, vomiting. Has had bright and dark stools. Has had ulcers in esophagus and also polyps. 3 episodes of bloody stools daily for last 10 days. No use of abx recently. No recent travels. Compliant with medicines ie asacol and also budesonide.    Past Medical History  Diagnosis Date  . Hypertension   . GERD (gastroesophageal reflux disease)   . Adenomatous colon polyp 10/1999  . Osteoporosis   . External hemorrhoids   . Crohn's disease of small and large intestines 1999  . Pneumonia   . Esophageal stricture   . MRSA (methicillin resistant Staphylococcus aureus) 10/2010   . Diabetes mellitus   . Hyperlipemia   . Cancer     skin  . Kidney stones   . Sleep apnea     CPAP machine  . PONV (postoperative nausea and vomiting)   . Complex partial seizure   . Migraines   . Seizures   . Cataract    Past Surgical History  Procedure Laterality Date  . Shoulder surgery    . Sinus surgery with instatrak    . Elbow surgery  2012    elbow MRSA infection   . Cataract extraction Bilateral    History   Social History  . Marital Status: Married    Spouse Name: N/A    Number of Children: N/A  . Years of Education: N/A   Occupational History  . [employer]   Social History Main Topics  . Smoking status: Never Smoker   . Smokeless tobacco: Never Used  . Alcohol Use: No  . Drug Use: No  . Sexually Active: Yes    Birth Control/ Protection: Surgical     Comment: 1 partner in  last 12 months   Other Topics Concern  . None   Social History Narrative  . None   Family History  Problem Relation Age of Onset  . Colon polyps Father   . Heart disease Father   . Stroke Father   . Heart disease Mother   . Colon cancer Neg Hx   . Stroke Mother   . Stroke Paternal Grandmother   . Stroke Paternal Grandfather    Allergies  Allergen Reactions  . Azithromycin Swelling  . Dilaudid (Hydromorphone Hcl) Other (See Comments)    n/v  . Penicillins Hives  . Sulfonamide Derivatives Hives   Prior to Admission medications   Medication Sig Start Date End Date Taking? Authorizing Provider  budesonide (ENTOCORT EC) 3 MG 24 hr capsule Take 3 capsules (9 mg total) by mouth every morning. Take 3 capsules (9 mg total) daily 08/12/12  Yes Ladene Artist, MD  budesonide (ENTOCORT EC) 3 MG 24 hr capsule Take 3 capsules (9 mg total) by mouth every morning. Take 3 capsules (9 mg total) daily 08/12/12  Yes Ladene Artist, MD  CALCIUM PO Take 1 tablet by mouth 3 (three) times daily.    Yes Historical Provider, MD  Cyanocobalamin (VITAMIN  B 12 PO) Take 1 tablet by mouth daily.   Yes Historical Provider, MD  diltiazem (TIAZAC) 240 MG 24 hr capsule Take 240 mg by mouth daily.   Yes Historical Provider, MD  EPINEPHrine (EPIPEN) 0.3 mg/0.3 mL DEVI Inject 0.3 mLs (0.3 mg total) into the muscle once. 02/26/12  Yes Wardell Honour, MD  esomeprazole (NEXIUM) 40 MG capsule Take 1 capsule (40 mg total) by mouth daily before breakfast. 08/12/12  Yes Ladene Artist, MD  folic acid (FOLVITE) 1 MG tablet Take 1 tablet (1 mg total) by mouth daily. 02/01/12  Yes [name], MD  folic acid (FOLVITE) 1 MG tablet Take 1 mg by mouth daily.   Yes Historical Provider, MD  glimepiride (AMARYL) 2 MG tablet TAKE 1 TABLET DAILY BEFORE BREAKFAST 08/17/12  Yes Ryan M Dunn, PA-C  glucose blood (CHOICE DM FORA G20 TEST STRIPS) test strip Use as instructed 10/16/11 10/15/12 Yes [name], MD  insulin glargine  (LANTUS) 100 UNIT/ML injection Inject 40 Units into the skin every morning.   Yes Historical Provider, MD  KLOR-CON M20 20 MEQ tablet TAKE 1 TABLET DAILY 08/07/12  Yes Dionne Bucy McClung, PA-C  Lactase (DAIRY-RELIEF PO) Take 3 tablets by mouth 3 (three) times daily.    Yes Historical Provider, MD  Lancets [facility] ULTRASOFT) lancets Use as instructed 10/16/11 10/15/12 Yes [name], MD  levETIRAcetam (KEPPRA) 750 MG tablet Take 1,500 mg by mouth 2 (two) times daily.    Yes Historical Provider, MD  mercaptopurine (PURINETHOL) 50 MG tablet Take 2 tablets (100 mg total) by mouth daily. Take two tablets (100 mg total) daily. Give on an empty stomach 1 hour before or 2 hours after meals. Caution: Chemotherapy. 08/12/12  Yes Ladene Artist, MD  Mesalamine (ASACOL HD) 800 MG TBEC Take 2 tablets (1,600 mg total) by mouth 3 (three) times daily. 09/01/12  Yes Ladene Artist, MD  metFORMIN (GLUCOPHAGE) 500 MG tablet Take 2 tablets (1,000 mg total) by mouth every evening. Take two 500 mg (1000 mg total) in the evening with dinner 05/19/12  Yes Eleanore E Egan, PA-C  methscopolamine (PAMINE FORTE) 5 MG tablet Take 1 tablet (5 mg total) by mouth 2 (two) times daily. 08/12/12  Yes Ladene Artist, MD  OVER THE COUNTER MEDICATION Taken Florastor OTC one tablet at night   Yes Historical Provider, MD  predniSONE (DELTASONE) 20 MG tablet Take 2 pills daily for 5 days, then 1 pill daily for 5 days 07/24/12  Yes Gay Filler Copland, MD  Saccharomyces boulardii (FLORASTOR PO) Take 1 tablet by mouth daily.    Yes Historical Provider, MD  valsartan (DIOVAN) 160 MG tablet Take 160 mg by mouth daily.   Yes Historical Provider, MD     ROS: The patient denies night sweats, unintentional weight loss, chest pain, palpitations, wheezing, dyspnea on exertion, nausea, vomiting, dysuria, hematuria, numbness, weakness, or tingling.   All other systems have been reviewed and were otherwise negative with the exception of those mentioned  in the HPI and as above.    PHYSICAL EXAM: Filed Vitals:   09/29/12 1709  BP: 126/74  Pulse: 98  Temp: 99.2 F (37.3 C)  Resp: 17   Filed Vitals:   09/29/12 1709  Height: 5' 10"  (1.778 m)  Weight: 256 lb (116.121 kg)   Body mass index is 36.73 kg/(m^2).  General: Alert, no acute distress HEENT:  Normocephalic, atraumatic, oropharynx patent.  Cardiovascular:  Regular rate and rhythm, no rubs murmurs or  gallops.  No Carotid bruits, radial pulse intact. No pedal edema.  Respiratory: Clear to auscultation bilaterally.  No wheezes, rales, or rhonchi.  No cyanosis, no use of accessory musculature GI: No organomegaly, abdomen is soft and non-tender, positive bowel sounds.  No masses. Skin: No rashes. Neurologic: Facial musculature symmetric. Psychiatric: Patient is appropriate throughout our interaction. Lymphatic: No cervical lymphadenopathy Musculoskeletal: Gait intact.   LABS: Results for orders placed in visit on 09/29/12  POCT CBC      Result Value Range   WBC 9.6  4.6 - 10.2 K/uL   Lymph, poc 1.6  0.6 - 3.4   POC LYMPH PERCENT 16.6  10 - 50 %L   MID (cbc) 0.9  0 - 0.9   POC MID % 9.0  0 - 12 %M   POC Granulocyte 7.1 (*) 2 - 6.9   Granulocyte percent 74.4  37 - 80 %G   RBC 4.61 (*) 4.69 - 6.13 M/uL   Hemoglobin 15.0  14.1 - 18.1 g/dL   HCT, POC 46.7  43.5 - 53.7 %   MCV 101.2 (*) 80 - 97 fL   MCH, POC 32.5 (*) 27 - 31.2 pg   MCHC 32.1  31.8 - 35.4 g/dL   RDW, POC 17.0     Platelet Count, POC 413  142 - 424 K/uL   MPV 9.5  0 - 99.8 fL     EKG/XRAY:   Primary read interpreted by Dr. Marin Comment at [facility]. No free air, no pneumothorax, no infiltrates Nonobstructive gas patterns   ASSESSMENT/PLAN: Encounter Diagnoses  Name Primary?  . Abdominal pain, unspecified site Yes  . Bloody stools    Most likely Crohn's flareup. He is hemodynamically stable.  Spoke with Dr. Charlott Holler Plan is to take Prednisone 40 mg daily  C diff, CMP  and ESR pending Taper will be done after  sees Dr. Fuller Plan or someone in his practice Try to get appt in next 3-4  Days F/u prn , monitor worsening sxs , go to ER prn  Will rx Norco 5/325 mg prn pain #30    , [place], DO 09/29/2012 7:45 PM

## 2012-09-30 ENCOUNTER — Telehealth: Payer: Self-pay | Admitting: Gastroenterology

## 2012-09-30 ENCOUNTER — Telehealth: Payer: Self-pay | Admitting: Family Medicine

## 2012-09-30 LAB — COMPREHENSIVE METABOLIC PANEL
ALT: 56 U/L — ABNORMAL HIGH (ref 0–53)
AST: 22 U/L (ref 0–37)
Albumin: 4.2 g/dL (ref 3.5–5.2)
Calcium: 9.5 mg/dL (ref 8.4–10.5)
Chloride: 104 mEq/L (ref 96–112)
Creat: 0.77 mg/dL (ref 0.50–1.35)
Potassium: 4.5 mEq/L (ref 3.5–5.3)

## 2012-09-30 LAB — COMPREHENSIVE METABOLIC PANEL WITH GFR
Alkaline Phosphatase: 68 U/L (ref 39–117)
BUN: 13 mg/dL (ref 6–23)
CO2: 21 meq/L (ref 19–32)
Glucose, Bld: 172 mg/dL — ABNORMAL HIGH (ref 70–99)
Sodium: 138 meq/L (ref 135–145)
Total Bilirubin: 1.3 mg/dL — ABNORMAL HIGH (ref 0.3–1.2)
Total Protein: 6.9 g/dL (ref 6.0–8.3)

## 2012-09-30 NOTE — Telephone Encounter (Signed)
Spoke to pt regarding labs and xray results.

## 2012-09-30 NOTE — Telephone Encounter (Signed)
Patient reports abdominal pain, rectal bleeding, cramping and urgency.  Patient was seen at the Urgent care yesterday and they called Dr. Carlean Purl.  He was started on prednisone 40 and norco for pain.  He is offered an appt with Nicoletta Ba PA for today or tomorrow, but he is unable to come due to work and driving restrictions.  He will come in at 11:00 and see Nicoletta Ba PA on Friday 10/02/12.  He will call back if his symptoms worsen or change prior to the appt.

## 2012-09-30 NOTE — Telephone Encounter (Signed)
Left message for patient to call back  

## 2012-10-01 ENCOUNTER — Encounter: Payer: Self-pay | Admitting: Nurse Practitioner

## 2012-10-01 ENCOUNTER — Telehealth: Payer: Self-pay | Admitting: Radiology

## 2012-10-01 ENCOUNTER — Ambulatory Visit (INDEPENDENT_AMBULATORY_CARE_PROVIDER_SITE_OTHER): Payer: 59 | Admitting: Nurse Practitioner

## 2012-10-01 VITALS — BP 111/76 | HR 93 | Ht 71.75 in | Wt 255.0 lb

## 2012-10-01 DIAGNOSIS — G40209 Localization-related (focal) (partial) symptomatic epilepsy and epileptic syndromes with complex partial seizures, not intractable, without status epilepticus: Secondary | ICD-10-CM

## 2012-10-01 DIAGNOSIS — G4733 Obstructive sleep apnea (adult) (pediatric): Secondary | ICD-10-CM | POA: Insufficient documentation

## 2012-10-01 LAB — CLOSTRIDIUM DIFFICILE EIA: CDIFTX: NEGATIVE

## 2012-10-01 MED ORDER — LACOSAMIDE 50 MG PO TABS: 50.0000 mg | ORAL_TABLET | Freq: Two times a day (BID) | ORAL | Status: DC

## 2012-10-01 NOTE — Telephone Encounter (Signed)
Called patient to advise left message for him to call back.

## 2012-10-01 NOTE — Progress Notes (Signed)
HPI: Patient returns for followup after his last visit with Dr. Brett Fairy 07/28/2012. He has a history of severe sleep apnea and is currently using CPAP. At that visit he has continued to have confusional episodes and his Keppra was increased to 1500 mg twice a day. He reports today that he is continuing to have confusional episodes but they are better than when last seen. His currently having a bout of colitis and on immunosuppressive treatment. He is not driving due to his continued seizure activity. He denies any side effects to his Keppra.  ROS:  - memory loss, confusion, dizziness, blood in stool, diarrhea  Physical Exam General: well developed, well nourished, seated, in no evident distress Head: head normocephalic and atraumatic. Oropharynx benign Neck: supple with no carotid or supraclavicular bruits Cardiovascular: regular rate and rhythm, no murmurs  Neurologic Exam Mental Status: Awake and fully alert. Oriented to place and time. Recent and remote memory intact. Attention span, concentration and fund of knowledge appropriate. Mood and affect appropriate.  Cranial Nerves: Fundoscopic exam reveals sharp disc margins. Pupils equal, briskly reactive to light. Extraocular movements full without nystagmus. Visual fields full to confrontation. Hearing intact and symmetric to finger snap. Facial sensation intact. Face, tongue, palate move normally and symmetrically. Neck flexion and extension normal.  Motor: Normal bulk and tone. Normal strength in all tested extremity muscles. Sensory.: intact to touch and pinprick and vibratory.  Coordination: Rapid alternating movements normal in all extremities. Finger-to-nose and heel-to-shin performed accurately bilaterally. Gait and Station: Arises from chair without difficulty. Stance is normal. Gait demonstrates normal stride length and balance . Able to heel, toe and tandem walk without difficulty.  Reflexes: 2+ and symmetric. Toes downgoing.      ASSESSMENT: Continue transient confusion episodes, normal neurologic exam. Exacerbation of Crohn's disease on immunosuppressive treatment. Severe obstructive sleep apnea on CPAP and claims compliance with machine.     PLAN: Continue Keppra 750 mg 2 tablets twice daily Vimpat sample packet given  Call in 2 weeks if continued  seizure activity Continue to use CPAP as directed for sleep apnea No driving until seizure free for 6 months  Followup in 3 months   Dennie Bible, GNP-BC APRN

## 2012-10-01 NOTE — Patient Instructions (Addendum)
Continue Keppra 750 mg 2 tablets twice daily Vimpat sample packet given  Call in 2 weeks if continued  seizure activity Continue to use CPAP as directed for sleep apnea Followup in 3 months

## 2012-10-01 NOTE — Telephone Encounter (Signed)
Message copied by Candice Camp on Thu Oct 01, 2012  3:33 PM ------      Message from: LE, [place] P      Created: Thu Oct 01, 2012  1:01 PM       Please call pt to let him know that C diff was negative ------

## 2012-10-01 NOTE — Telephone Encounter (Signed)
Patient advised, he will see GI doctor tomorrow , he feels slightly better.

## 2012-10-02 ENCOUNTER — Encounter: Payer: Self-pay | Admitting: Physician Assistant

## 2012-10-02 ENCOUNTER — Ambulatory Visit (INDEPENDENT_AMBULATORY_CARE_PROVIDER_SITE_OTHER): Payer: 59 | Admitting: Physician Assistant

## 2012-10-02 VITALS — BP 116/80 | HR 88 | Ht 70.25 in | Wt 257.2 lb

## 2012-10-02 DIAGNOSIS — K5080 Crohn's disease of both small and large intestine without complications: Secondary | ICD-10-CM

## 2012-10-02 DIAGNOSIS — R109 Unspecified abdominal pain: Secondary | ICD-10-CM

## 2012-10-02 DIAGNOSIS — K921 Melena: Secondary | ICD-10-CM

## 2012-10-02 MED ORDER — PREDNISONE 10 MG PO TABS: ORAL_TABLET | ORAL | Status: DC

## 2012-10-02 NOTE — Progress Notes (Signed)
Subjective:    Patient ID: [name] [name], male    DOB: 19-Nov-1960, 52 y.o.   MRN: [id number]  HPI Emilio is a very nice 52 year old white male known to Dr. Fuller Plan with history of Crohn's ileocolitis. He is currently being maintained on Entocort 9 mg per day, 6-MP 100 mg per day and Asacol. He was seen in March of 2014 and at that time was doing well without any active symptoms. His last colonoscopy was done in 2011 at that time there was no evidence of active disease. He says he started about 10 days ago having problems with abdominal pain and cramping followed by increased frequency of bowel movements and bright red blood in his stools. Says he was hurting in the upper abdomen to the [place] with pain similar to his prior episodes of Crohn's exacerbations but says that he had had been having fairly intense pain which she had not experienced for several years. This was not associated with any fever nausea vomiting. He's been having 3-5 bowel movements per day with visible red blood He went to urgent care 2 days ago because he says he could not get in here, was started on prednisone 40 mg by mouth daily, had plain films done which were apparently negative except for nephrolithiasis. Stool for C. difficile toxin was also done and was negative. After 2 days on prednisone he says he is feeling better and that his abdominal pain is not nearly as intense. He is still having cramping and pain, but has been able to take by mouth as without any difficulty. He has not been on any new medications any different over-the-counter medications antibiotics etc.    Review of Systems  Constitutional: Negative.   HENT: Negative.   Eyes: Negative.   Respiratory: Negative.   Cardiovascular: Negative.   Gastrointestinal: Positive for abdominal pain, diarrhea and blood in stool.  Endocrine: Negative.   Genitourinary: Negative.   Allergic/Immunologic: Negative.   Neurological: Negative.   Hematological: Negative.    Psychiatric/Behavioral: Negative.    Outpatient Prescriptions Prior to Visit  Medication Sig Dispense Refill  . budesonide (ENTOCORT EC) 3 MG 24 hr capsule Take 3 capsules (9 mg total) by mouth every morning. Take 3 capsules (9 mg total) daily  270 capsule  3  . CALCIUM PO Take 1 tablet by mouth 3 (three) times daily.       . Cyanocobalamin (VITAMIN B 12 PO) Take 1 tablet by mouth daily.      Marland Kitchen diltiazem (TIAZAC) 240 MG 24 hr capsule Take 240 mg by mouth daily.      Marland Kitchen EPINEPHrine (EPIPEN) 0.3 mg/0.3 mL DEVI Inject 0.3 mLs (0.3 mg total) into the muscle once.  1 Device  1  . esomeprazole (NEXIUM) 40 MG capsule Take 1 capsule (40 mg total) by mouth daily before breakfast.  90 capsule  3  . folic acid (FOLVITE) 1 MG tablet Take 1 tablet (1 mg total) by mouth daily.  90 tablet  3  . glimepiride (AMARYL) 2 MG tablet TAKE 1 TABLET DAILY BEFORE BREAKFAST  90 tablet  0  . glucose blood (CHOICE DM FORA G20 TEST STRIPS) test strip Use as instructed  100 each  12  . HYDROcodone-acetaminophen (NORCO) 5-325 MG per tablet Take 1 tablet by mouth every 6 (six) hours as needed for pain.  30 tablet  0  . insulin glargine (LANTUS) 100 UNIT/ML injection Inject 40 Units into the skin every morning.      Marland Kitchen KLOR-CON  M20 20 MEQ tablet TAKE 1 TABLET DAILY  90 tablet  0  . lacosamide (VIMPAT) 50 MG TABS Take 1 tablet (50 mg total) by mouth 2 (two) times daily.  1 each  0  . Lactase (DAIRY-RELIEF PO) Take 3 tablets by mouth 3 (three) times daily.       . Lancets (ONETOUCH ULTRASOFT) lancets Use as instructed  100 each  12  . levETIRAcetam (KEPPRA) 750 MG tablet Take 1,500 mg by mouth 2 (two) times daily.       . mercaptopurine (PURINETHOL) 50 MG tablet Take 2 tablets (100 mg total) by mouth daily. Take two tablets (100 mg total) daily. Give on an empty stomach 1 hour before or 2 hours after meals. Caution: Chemotherapy.  180 tablet  3  . Mesalamine (ASACOL HD) 800 MG TBEC Take 2 tablets (1,600 mg total) by mouth 3  (three) times daily.  270 tablet  1  . metFORMIN (GLUCOPHAGE) 500 MG tablet Take 2 tablets (1,000 mg total) by mouth every evening. Take two 500 mg (1000 mg total) in the evening with dinner  180 tablet  0  . methscopolamine (PAMINE FORTE) 5 MG tablet Take 1 tablet (5 mg total) by mouth 2 (two) times daily.  180 tablet  3  . tamsulosin (FLOMAX) 0.4 MG CAPS Take 0.4 mg by mouth daily.      . valsartan (DIOVAN) 160 MG tablet Take 160 mg by mouth daily.      . predniSONE (DELTASONE) 10 MG tablet Take 40 mg daily , continue until see GI doctor in next 3-5 days then will determine taper  100 tablet  0   No facility-administered medications prior to visit.   Allergies  Allergen Reactions  . Azithromycin Swelling  . Dilaudid (Hydromorphone Hcl) Other (See Comments)    n/v  . Penicillins Hives  . Sulfonamide Derivatives Hives   Patient Active Problem List  Diagnosis  . HYPERLIPIDEMIA NEC/NOS  . HYPERTENSION, ESSENTIAL NOS  . EXTERNAL HEMORRHOIDS  . GERD  . CROHN'S DISEASE, LARGE AND SMALL INTESTINES  . OSTEOPOROSIS  . COUGH  . OTHER DYSPHAGIA  . TRANSAMINASES, SERUM, ELEVATED  . COLONIC POLYPS, ADENOMATOUS, HX OF  . Diabetes mellitus type II  . Altered mental status  . Complex partial seizure  . Obstructive sleep apnea   History  Substance Use Topics  . Smoking status: Never Smoker   . Smokeless tobacco: Never Used  . Alcohol Use: No      family history includes Colon polyps in his father; Heart disease in his father and mother; and Stroke in his father, mother, paternal grandfather, and paternal grandmother.  There is no history of Colon cancer.  Objective:   Physical Exam well-developed white male in no acute distress, pleasant blood pressure 116/80 pulse 88 height 5 foot 10 weight 257. HEENT; nontraumatic normocephalic EOMI PERRLA sclera anicteric, Supple; no JVD, Cardiovascular; regular rate and rhythm with S1-S2 no murmur or gallop, Pulmonary; clear bilaterally, Abdomen;  large soft he is tender across the upper abdomen and in the left mid quadrant left lower quadrant no guarding or rebound no palpable mass or hepatosplenomegaly bowel sounds are present, Rectal ;exam not done, Extremities ;no clubbing cyanosis or edema skin warm and dry, Psych ;mood and affect normal and appropriate        Assessment & Plan:  #87  52 year old male with Crohn's ileocolitis has been well controlled over the past several years now presenting with acute abdominal pain cramping diarrhea and  hematochezia x10 days. Suspect he is having an exacerbation of his Crohn's with colitis symptoms. Superimposed C. difficile was ruled out with negative toxin, if he does not have clear improvement quickly with prednisone will also need to do cultures for CMV. He seems to be improving after 48 hours of prednisone which is reassuring.  Plan; stop Entocort while on prednisone Continue prednisone 40 mg by mouth daily x1 week then decrease to 30 mg daily x2 weeks then decrease to 20 mg daily x2 weeks then 10 mg daily x2 weeks Continue 6-MP 100 mg by mouth daily Continue Asacol  800 mg 3 times daily Followup office visit with Dr. Fuller Plan in 3 weeks-patient is advised he should call in the interim for any problems. He is scheduled for routine colonoscopy in June of 2014, this may need to be done sooner if he does not have clear improvement with prednisone.

## 2012-10-02 NOTE — Patient Instructions (Addendum)
We sent a prescription to [facility] for Prednisone 10 mg.  Take 40 mg ( 4 tab) for 7 days. Take 30 mg ( 3 tabs) for 14 days. Take 20 mg ( 2 tabs ) for 14 days. Stay on the 20 mg until you office visit with Dr. Fuller Plan on 11-09-2012 at 9:15 AM.

## 2012-10-04 NOTE — Progress Notes (Signed)
Reviewed and agree with management plan.  Natanael Saladin T. Nameer Summer, MD FACG 

## 2012-10-05 ENCOUNTER — Telehealth: Payer: Self-pay | Admitting: *Deleted

## 2012-10-05 NOTE — Telephone Encounter (Signed)
Left patient a message stating that we need his juror number and the dates he is suppose to go.

## 2012-10-05 NOTE — Telephone Encounter (Signed)
Patient is calling wanting to know if he can have a letter written for jury duty? The want him to come to the [facility] court and he states that since he has seizures he can not drive there and that he can't get anybody to take him.

## 2012-10-05 NOTE — Telephone Encounter (Signed)
I need his juror # and report dates for the letter.

## 2012-10-06 ENCOUNTER — Other Ambulatory Visit: Payer: Self-pay | Admitting: Dermatology

## 2012-10-06 ENCOUNTER — Encounter: Payer: Self-pay | Admitting: Nurse Practitioner

## 2012-10-06 NOTE — Telephone Encounter (Signed)
Called patient and he will come pick the letter up within the next 2 days.

## 2012-10-06 NOTE — Telephone Encounter (Signed)
Pt juror # is [id number] Date Mon May 19th Panel # [id number]

## 2012-10-06 NOTE — Telephone Encounter (Signed)
Letter written

## 2012-10-08 ENCOUNTER — Other Ambulatory Visit: Payer: Self-pay | Admitting: Family Medicine

## 2012-10-08 MED ORDER — INSULIN GLARGINE 100 UNIT/ML ~~LOC~~ SOLN: 40.0000 [IU] | Freq: Every morning | SUBCUTANEOUS | Status: DC

## 2012-10-09 ENCOUNTER — Telehealth: Payer: Self-pay

## 2012-10-09 DIAGNOSIS — E119 Type 2 diabetes mellitus without complications: Secondary | ICD-10-CM

## 2012-10-09 NOTE — Telephone Encounter (Signed)
Pharm faxed request to have a new Rx sent in for Lantus Solostar pens instead of lantus vial. Dr Lorelei Pont, do you want me to change the Rx to the pens and give pt pen needles also?

## 2012-10-10 MED ORDER — INSULIN PEN NEEDLE 31G X 5 MM MISC: Status: DC

## 2012-10-10 MED ORDER — INSULIN GLARGINE 100 UNIT/ML ~~LOC~~ SOLN: 40.0000 [IU] | Freq: Every day | SUBCUTANEOUS | Status: DC

## 2012-10-14 ENCOUNTER — Telehealth: Payer: Self-pay

## 2012-10-14 MED ORDER — LACOSAMIDE 100 MG PO TABS: 100.0000 mg | ORAL_TABLET | Freq: Two times a day (BID) | ORAL | Status: DC

## 2012-10-14 NOTE — Telephone Encounter (Signed)
Patient called requesting a new rx for Vimpat.  He would like a 30 day rx to local pharmacy [facility]) and a 90 day rx to [facility].

## 2012-10-29 ENCOUNTER — Emergency Department (HOSPITAL_COMMUNITY): Payer: 59

## 2012-10-29 ENCOUNTER — Ambulatory Visit: Payer: 59

## 2012-10-29 ENCOUNTER — Encounter (HOSPITAL_COMMUNITY): Payer: Self-pay | Admitting: *Deleted

## 2012-10-29 ENCOUNTER — Ambulatory Visit: Payer: 59 | Admitting: Emergency Medicine

## 2012-10-29 ENCOUNTER — Emergency Department (HOSPITAL_COMMUNITY)
Admission: EM | Admit: 2012-10-29 | Discharge: 2012-10-29 | Disposition: A | Discharge status: Home / Self Care | Payer: 59 | Attending: Emergency Medicine | Admitting: Emergency Medicine

## 2012-10-29 VITALS — BP 116/72 | HR 105 | Temp 98.6°F | Resp 16 | Ht 71.0 in | Wt 258.0 lb

## 2012-10-29 DIAGNOSIS — Z8701 Personal history of pneumonia (recurrent): Secondary | ICD-10-CM | POA: Insufficient documentation

## 2012-10-29 DIAGNOSIS — Z87442 Personal history of urinary calculi: Secondary | ICD-10-CM | POA: Insufficient documentation

## 2012-10-29 DIAGNOSIS — Z860100 Personal history of colon polyps, unspecified: Secondary | ICD-10-CM | POA: Insufficient documentation

## 2012-10-29 DIAGNOSIS — G4730 Sleep apnea, unspecified: Secondary | ICD-10-CM | POA: Insufficient documentation

## 2012-10-29 DIAGNOSIS — G43909 Migraine, unspecified, not intractable, without status migrainosus: Secondary | ICD-10-CM | POA: Insufficient documentation

## 2012-10-29 DIAGNOSIS — K219 Gastro-esophageal reflux disease without esophagitis: Secondary | ICD-10-CM | POA: Insufficient documentation

## 2012-10-29 DIAGNOSIS — K5090 Crohn's disease, unspecified, without complications: Secondary | ICD-10-CM

## 2012-10-29 DIAGNOSIS — R110 Nausea: Secondary | ICD-10-CM | POA: Insufficient documentation

## 2012-10-29 DIAGNOSIS — I10 Essential (primary) hypertension: Secondary | ICD-10-CM | POA: Insufficient documentation

## 2012-10-29 DIAGNOSIS — E785 Hyperlipidemia, unspecified: Secondary | ICD-10-CM | POA: Insufficient documentation

## 2012-10-29 DIAGNOSIS — E119 Type 2 diabetes mellitus without complications: Secondary | ICD-10-CM | POA: Insufficient documentation

## 2012-10-29 DIAGNOSIS — R509 Fever, unspecified: Secondary | ICD-10-CM | POA: Insufficient documentation

## 2012-10-29 DIAGNOSIS — Z8679 Personal history of other diseases of the circulatory system: Secondary | ICD-10-CM | POA: Insufficient documentation

## 2012-10-29 DIAGNOSIS — Z85038 Personal history of other malignant neoplasm of large intestine: Secondary | ICD-10-CM | POA: Insufficient documentation

## 2012-10-29 DIAGNOSIS — E669 Obesity, unspecified: Secondary | ICD-10-CM | POA: Insufficient documentation

## 2012-10-29 DIAGNOSIS — Z8614 Personal history of Methicillin resistant Staphylococcus aureus infection: Secondary | ICD-10-CM | POA: Insufficient documentation

## 2012-10-29 DIAGNOSIS — G40909 Epilepsy, unspecified, not intractable, without status epilepticus: Secondary | ICD-10-CM | POA: Insufficient documentation

## 2012-10-29 DIAGNOSIS — Z85828 Personal history of other malignant neoplasm of skin: Secondary | ICD-10-CM | POA: Insufficient documentation

## 2012-10-29 DIAGNOSIS — Z880 Allergy status to penicillin: Secondary | ICD-10-CM | POA: Insufficient documentation

## 2012-10-29 DIAGNOSIS — Z8739 Personal history of other diseases of the musculoskeletal system and connective tissue: Secondary | ICD-10-CM | POA: Insufficient documentation

## 2012-10-29 DIAGNOSIS — Z8669 Personal history of other diseases of the nervous system and sense organs: Secondary | ICD-10-CM | POA: Insufficient documentation

## 2012-10-29 DIAGNOSIS — Z8719 Personal history of other diseases of the digestive system: Secondary | ICD-10-CM | POA: Insufficient documentation

## 2012-10-29 DIAGNOSIS — R109 Unspecified abdominal pain: Secondary | ICD-10-CM | POA: Insufficient documentation

## 2012-10-29 LAB — COMPREHENSIVE METABOLIC PANEL
ALT: 48 U/L (ref 0–53)
Albumin: 3.2 g/dL — ABNORMAL LOW (ref 3.5–5.2)
Alkaline Phosphatase: 69 U/L (ref 39–117)
Potassium: 4 mEq/L (ref 3.5–5.1)
Sodium: 137 mEq/L (ref 135–145)
Total Protein: 6.5 g/dL (ref 6.0–8.3)

## 2012-10-29 LAB — CBC WITH DIFFERENTIAL/PLATELET
Eosinophils Absolute: 0 10*3/uL (ref 0.0–0.7)
Hemoglobin: 13.4 g/dL (ref 13.0–17.0)
Lymphs Abs: 1.1 10*3/uL (ref 0.7–4.0)
MCH: 33.7 pg (ref 26.0–34.0)
MCV: 97 fL (ref 78.0–100.0)
Monocytes Relative: 9 % (ref 3–12)
Neutrophils Relative %: 75 % (ref 43–77)
RBC: 3.98 MIL/uL — ABNORMAL LOW (ref 4.22–5.81)

## 2012-10-29 LAB — URINALYSIS, ROUTINE W REFLEX MICROSCOPIC
Bilirubin Urine: NEGATIVE
Glucose, UA: >1000 mg/dL — AB
Hgb urine dipstick: NEGATIVE
Nitrite: NEGATIVE
Specific Gravity, Urine: 1.036 — ABNORMAL HIGH (ref 1.005–1.030)
pH: 5.5 (ref 5.0–8.0)

## 2012-10-29 LAB — URINE MICROSCOPIC-ADD ON

## 2012-10-29 MED ORDER — ONDANSETRON HCL 4 MG/2ML IJ SOLN
4.0000 mg | Freq: Once | INTRAMUSCULAR | Status: AC
  Administered 2012-10-29: 4 mg via INTRAVENOUS
  Filled 2012-10-29: qty 2

## 2012-10-29 MED ORDER — PROMETHAZINE HCL 25 MG PO TABS: 25.0000 mg | ORAL_TABLET | Freq: Four times a day (QID) | ORAL | Status: DC | PRN

## 2012-10-29 MED ORDER — SODIUM CHLORIDE 0.9 % IV SOLN
1000.0000 mL | INTRAVENOUS | Status: DC
  Administered 2012-10-29: 1000 mL via INTRAVENOUS

## 2012-10-29 MED ORDER — ONDANSETRON 8 MG PO TBDP: 8.0000 mg | ORAL_TABLET | Freq: Three times a day (TID) | ORAL | Status: DC | PRN

## 2012-10-29 MED ORDER — HYDROCODONE-ACETAMINOPHEN 5-325 MG PO TABS: 1.0000 | ORAL_TABLET | Freq: Four times a day (QID) | ORAL | Status: DC | PRN

## 2012-10-29 MED ORDER — FENTANYL CITRATE 0.05 MG/ML IJ SOLN
100.0000 ug | Freq: Once | INTRAMUSCULAR | Status: AC
  Administered 2012-10-29: 100 ug via INTRAVENOUS
  Filled 2012-10-29: qty 2

## 2012-10-29 MED ORDER — IOHEXOL 300 MG/ML  SOLN
100.0000 mL | Freq: Once | INTRAMUSCULAR | Status: AC | PRN
  Administered 2012-10-29: 100 mL via INTRAVENOUS

## 2012-10-29 MED ORDER — SODIUM CHLORIDE 0.9 % IV SOLN
1000.0000 mL | Freq: Once | INTRAVENOUS | Status: AC
  Administered 2012-10-29: 1000 mL via INTRAVENOUS

## 2012-10-29 MED ORDER — IOHEXOL 300 MG/ML  SOLN
50.0000 mL | Freq: Once | INTRAMUSCULAR | Status: AC | PRN
  Administered 2012-10-29: 50 mL via ORAL

## 2012-10-29 NOTE — ED Provider Notes (Signed)
History   CSN: [id number] Arrival date & time 10/29/12  1814 First MD Initiated Contact with Patient 10/29/12 1819      Chief Complaint  Patient presents with  . Abdominal Pain    HPI Patient presents to the emergency room with complaints of abdominal pain that started several days ago. Patient has history of Crohn disease but has never had pain like this before. Over the last few days he's noticed pain that is often brought on by eating. Pain will start in the right chest and moved towards the right side of his abdomen and then moves throughout his abdomen. He has not had any vomiting with it but he has had nausea. He does have history of Crohn's disease and often has loose stools but has not noticed any particular change with that. He has not noticed any blood in his stools. He's not sure if he's had any fevers because he felt warm one day but did not take his temperature. He went to see his doctor today who was concerned with the degree of pain he was having so you sent to the emergency room for further evaluation the Past Medical History  Diagnosis Date  . Hypertension   . GERD (gastroesophageal reflux disease)   . Adenomatous colon polyp 10/1999  . Osteoporosis   . External hemorrhoids   . Crohn's disease of small and large intestines 1999  . Pneumonia   . Esophageal stricture   . MRSA (methicillin resistant Staphylococcus aureus) 10/2010   . Diabetes mellitus   . Hyperlipemia   . Cancer     skin  . Kidney stones   . Sleep apnea     CPAP machine  . PONV (postoperative nausea and vomiting)   . Complex partial seizure   . Migraines   . Seizures   . Cataract     Past Surgical History  Procedure Laterality Date  . Shoulder surgery    . Sinus surgery with instatrak    . Elbow surgery  2012    elbow MRSA infection   . Cataract extraction Bilateral   . Vasectomy    . Eye surgery      Family History  Problem Relation Age of Onset  . Colon polyps Father   . Heart disease  Father   . Stroke Father   . Heart disease Mother   . Colon cancer Neg Hx   . Stroke Mother   . Stroke Paternal Grandmother   . Stroke Paternal Grandfather     History  Substance Use Topics  . Smoking status: Never Smoker   . Smokeless tobacco: Never Used  . Alcohol Use: No      Review of Systems  All other systems reviewed and are negative.    Allergies  Azithromycin; Dilaudid; Penicillins; and Sulfonamide derivatives  Home Medications   Current Outpatient Rx  Name  Route  Sig  Dispense  Refill  . CALCIUM PO   Oral   Take 1 tablet by mouth 3 (three) times daily.          . Cyanocobalamin (VITAMIN B 12 PO)   Oral   Take 1 tablet by mouth daily.         Marland Kitchen diltiazem (TIAZAC) 240 MG 24 hr capsule   Oral   Take 240 mg by mouth daily.         Marland Kitchen EPINEPHrine (EPIPEN) 0.3 mg/0.3 mL DEVI   Intramuscular   Inject 0.3 mLs (0.3 mg total) into the muscle  once.   1 Device   1   . esomeprazole (NEXIUM) 40 MG capsule   Oral   Take 40 mg by mouth daily before breakfast.         . folic acid (FOLVITE) 1 MG tablet   Oral   Take 1 mg by mouth daily.         Marland Kitchen HYDROcodone-acetaminophen (NORCO/VICODIN) 5-325 MG per tablet   Oral   Take 1 tablet by mouth every 6 (six) hours as needed for pain.         Marland Kitchen insulin glargine (LANTUS) 100 UNIT/ML injection   Subcutaneous   Inject 40 Units into the skin daily.         . Lacosamide (VIMPAT) 100 MG TABS   Oral   Take 100 mg by mouth 2 (two) times daily.         . Lactase (DAIRY-RELIEF PO)   Oral   Take 3 tablets by mouth 3 (three) times daily.          Marland Kitchen levETIRAcetam (KEPPRA) 750 MG tablet   Oral   Take 1,500 mg by mouth 2 (two) times daily.          . mercaptopurine (PURINETHOL) 50 MG tablet   Oral   Take 100 mg by mouth 2 (two) times daily. Give on an empty stomach 1 hour before or 2 hours after meals. Caution: Chemotherapy.         . Mesalamine 800 MG TBEC   Oral   Take 1,600 mg by mouth 2  (two) times daily.         . metFORMIN (GLUCOPHAGE) 500 MG tablet   Oral   Take 1,000 mg by mouth every evening.         . methscopolamine (PAMINE FORTE) 5 MG tablet   Oral   Take 5 mg by mouth 2 (two) times daily.         . potassium chloride SA (K-DUR,KLOR-CON) 20 MEQ tablet   Oral   Take 20 mEq by mouth 2 (two) times daily.         . predniSONE (DELTASONE) 10 MG tablet   Oral   Take 20 mg by mouth daily. Take as directed.         Marland Kitchen saccharomyces boulardii (FLORASTOR) 250 MG capsule   Oral   Take 250 mg by mouth 2 (two) times daily.         . tamsulosin (FLOMAX) 0.4 MG CAPS   Oral   Take 0.4 mg by mouth as needed.          . valsartan (DIOVAN) 160 MG tablet   Oral   Take 160 mg by mouth daily.         Marland Kitchen HYDROcodone-acetaminophen (NORCO) 5-325 MG per tablet   Oral   Take 1-2 tablets by mouth every 6 (six) hours as needed for pain.   20 tablet   0   . ondansetron (ZOFRAN ODT) 8 MG disintegrating tablet   Oral   Take 1 tablet (8 mg total) by mouth every 8 (eight) hours as needed for nausea.   20 tablet   0   . promethazine (PHENERGAN) 25 MG tablet   Oral   Take 1 tablet (25 mg total) by mouth every 6 (six) hours as needed for nausea.   30 tablet   0     BP 116/79  Pulse 82  Temp(Src) 98.2 F (36.8 C) (Oral)  Resp 18  Ht 5'  10" (1.778 m)  Wt 250 lb (113.399 kg)  BMI 35.87 kg/m2  SpO2 98%  Physical Exam  Nursing note and vitals reviewed. Constitutional: He appears well-developed and well-nourished. No distress.  Obese  HENT:  Head: Normocephalic and atraumatic.  Right Ear: External ear normal.  Left Ear: External ear normal.  Eyes: Conjunctivae are normal. Right eye exhibits no discharge. Left eye exhibits no discharge. No scleral icterus.  Neck: Neck supple. No tracheal deviation present.  Cardiovascular: Normal rate, regular rhythm and intact distal pulses.   Pulmonary/Chest: Effort normal and breath sounds normal. No stridor. No  respiratory distress. He has no wheezes. He has no rales.  Abdominal: Soft. Bowel sounds are normal. He exhibits no distension. There is tenderness in the right lower quadrant, epigastric area and left lower quadrant. There is guarding. There is no rigidity and no rebound. No hernia.  Musculoskeletal: He exhibits no edema and no tenderness.  Neurological: He is alert. He has normal strength. No sensory deficit. Cranial nerve deficit:  no gross defecits noted. He exhibits normal muscle tone. He displays no seizure activity. Coordination normal.  Skin: Skin is warm and dry. No rash noted.  Psychiatric: He has a normal mood and affect.    ED Course  Procedures (including critical care time)  Labs Reviewed  COMPREHENSIVE METABOLIC PANEL - Abnormal; Notable for the following:    Glucose, Bld 187 (*)    Albumin 3.2 (*)    All other components within normal limits  URINALYSIS, ROUTINE W REFLEX MICROSCOPIC - Abnormal; Notable for the following:    Specific Gravity, Urine 1.036 (*)    Glucose, UA >1000 (*)    All other components within normal limits  CBC WITH DIFFERENTIAL - Abnormal; Notable for the following:    RBC 3.98 (*)    HCT 38.6 (*)    All other components within normal limits  GLUCOSE, CAPILLARY - Abnormal; Notable for the following:    Glucose-Capillary 144 (*)    All other components within normal limits  URINE MICROSCOPIC-ADD ON - Abnormal; Notable for the following:    Bacteria, UA FEW (*)    All other components within normal limits  LIPASE, BLOOD   Ct Abdomen Pelvis W Contrast  10/29/2012  *RADIOLOGY REPORT*  Clinical Data: Right upper quadrant pain 4 days.  History Crohn's disease  CT ABDOMEN AND PELVIS WITH CONTRAST  Technique:  Multidetector CT imaging of the abdomen and pelvis was performed following the standard protocol during bolus administration of intravenous contrast.  Contrast: 77m OMNIPAQUE IOHEXOL 300 MG/ML  SOLN, 1047mOMNIPAQUE IOHEXOL 300 MG/ML  SOLN   Comparison: CT 08/27/2011  Findings: Mild bibasilar atelectasis.  No pleural fluid.  No pericardial fluid.  There is no focal hepatic lesion.  The gallbladder, pancreas, spleen, and adrenal glands are normal.  There are four nonobstructing calculi within the left kidney.  No hydroureter or ureterolithiasis.  There are nonenhancing cysts within the left kidney which are unchanged.  The stomach, small bowel, and terminal ileum are normal.  There is no evidence of fistula or abscess.  There are several diverticula of  the sigmoid colon without acute inflammation.  Abdominal aorta is normal caliber.  No retroperitoneal periportal lymphadenopathy.  There is no free fluid the pelvis.  The prostate gland and bladder normal.  There are bilateral fat filled inguinal hernias.  Small umbilical hernia.  No aggressive osseous lesions.  IMPRESSION: 1.  No acute abdominal or pelvic findings. 2.  Left nephrolithiasis without obstructive  uropathy. 3.  No evidence of active Crohn's disease.   Original Report Authenticated By: Suzy Bouchard, M.D.      1. Abdominal pain       MDM  No definite etiology the patient's pain based on the CT scan and laboratory tests.  However, he does not have evidence of obstruction, appendicitis, cholecystitis, pancreatitis or other acute abdominal pathology. Patient will be discharged home with medications for pain and nausea. He has followup plan with his primary doctor as well as his gastroenterologist        Kathalene Frames, MD 10/29/12 2253

## 2012-10-29 NOTE — ED Notes (Signed)
144 cbg as reported by April, EMT

## 2012-10-29 NOTE — ED Notes (Signed)
Pt in c/o right upper quad abd pain x4 days, pain increased with food intake, also nausea, denies vomiting, pt with history of crohns but states this pain is different.

## 2012-10-29 NOTE — ED Notes (Signed)
GIL:[id number]<HH> Expected date:<BR> Expected time:<BR> Means of arrival:<BR> Comments:<BR> Hold for triage

## 2012-10-29 NOTE — ED Notes (Signed)
MD at bedside. 

## 2012-10-29 NOTE — ED Notes (Signed)
Urinal in room and advised need sample.

## 2012-10-29 NOTE — Progress Notes (Signed)
[facility] [address] [place] [address] [phone]  Date:  10/29/2012   Name:  [name] [name]   DOB:  03/26/1961   MRN:  [id number]  PCP:  [name], MD    Chief Complaint: Abdominal Pain and Chills   History of Present Illness:  [name] [name] is a 52 y.o. very pleasant male patient who presents with the following:  History of crohn's disease and has experienced right pleuritic chest pain for past four days.  Says the pain he usually experiences is different from today.   Some exertional shortness of breath but no wheezing or cough.  Pain in epigastrium. Has had prednisone 40 mg daily for past month.  Constant nausea.  No vomiting.  Some loose stool with food, particularly greasy or fried food.  No risks for DVT/PE.  No fever or chills, rash.  No improvement with over the counter medications or other home remedies. Denies other complaint or health concern today.   Patient Active Problem List   Diagnosis Date Noted  . Obstructive sleep apnea 10/01/2012  . Complex partial seizure 04/19/2012  . Diabetes mellitus type II 09/25/2011  . Altered mental status 09/25/2011  . OTHER DYSPHAGIA 03/27/2009  . TRANSAMINASES, SERUM, ELEVATED 03/27/2009  . COUGH 11/10/2008  . GERD 12/07/2007  . COLONIC POLYPS, ADENOMATOUS, HX OF 12/07/2007  . EXTERNAL HEMORRHOIDS 09/11/2007  . CROHN'S DISEASE, LARGE AND SMALL INTESTINES 09/11/2007  . OSTEOPOROSIS 09/11/2007  . HYPERLIPIDEMIA NEC/NOS 02/27/2007  . HYPERTENSION, ESSENTIAL NOS 02/27/2007    Past Medical History  Diagnosis Date  . Hypertension   . GERD (gastroesophageal reflux disease)   . Adenomatous colon polyp 10/1999  . Osteoporosis   . External hemorrhoids   . Crohn's disease of small and large intestines 1999  . Pneumonia   . Esophageal stricture   . MRSA (methicillin resistant Staphylococcus aureus) 10/2010   . Diabetes mellitus   . Hyperlipemia   . Cancer     skin  . Kidney stones   . Sleep  apnea     CPAP machine  . PONV (postoperative nausea and vomiting)   . Complex partial seizure   . Migraines   . Seizures   . Cataract     Past Surgical History  Procedure Laterality Date  . Shoulder surgery    . Sinus surgery with instatrak    . Elbow surgery  2012    elbow MRSA infection   . Cataract extraction Bilateral   . Vasectomy    . Eye surgery      History  Substance Use Topics  . Smoking status: Never Smoker   . Smokeless tobacco: Never Used  . Alcohol Use: No    Family History  Problem Relation Age of Onset  . Colon polyps Father   . Heart disease Father   . Stroke Father   . Heart disease Mother   . Colon cancer Neg Hx   . Stroke Mother   . Stroke Paternal Grandmother   . Stroke Paternal Grandfather     Allergies  Allergen Reactions  . Azithromycin Swelling  . Dilaudid (Hydromorphone Hcl) Other (See Comments)    n/v  . Penicillins Hives  . Sulfonamide Derivatives Hives    Medication list has been reviewed and updated.  Current Outpatient Prescriptions on File Prior to Visit  Medication Sig Dispense Refill  . budesonide (ENTOCORT EC) 3 MG 24 hr capsule Take 3 capsules (9 mg total) by mouth every morning. Take  3 capsules (9 mg total) daily  270 capsule  3  . CALCIUM PO Take 1 tablet by mouth 3 (three) times daily.       . Cyanocobalamin (VITAMIN B 12 PO) Take 1 tablet by mouth daily.      Marland Kitchen diltiazem (TIAZAC) 240 MG 24 hr capsule Take 240 mg by mouth daily.      Marland Kitchen EPINEPHrine (EPIPEN) 0.3 mg/0.3 mL DEVI Inject 0.3 mLs (0.3 mg total) into the muscle once.  1 Device  1  . esomeprazole (NEXIUM) 40 MG capsule Take 1 capsule (40 mg total) by mouth daily before breakfast.  90 capsule  3  . folic acid (FOLVITE) 1 MG tablet Take 1 tablet (1 mg total) by mouth daily.  90 tablet  3  . glimepiride (AMARYL) 2 MG tablet TAKE 1 TABLET DAILY BEFORE BREAKFAST  90 tablet  0  . HYDROcodone-acetaminophen (NORCO) 5-325 MG per tablet Take 1 tablet by mouth every 6  (six) hours as needed for pain.  30 tablet  0  . insulin glargine (LANTUS SOLOSTAR) 100 UNIT/ML injection Inject 0.4 mLs (40 Units total) into the skin at bedtime.  5 pen  PRN  . Insulin Pen Needle 31G X 5 MM MISC Use once daily with lantus pen  100 each  prn  . KLOR-CON M20 20 MEQ tablet TAKE 1 TABLET DAILY  90 tablet  0  . Lacosamide 100 MG TABS Take 1 tablet (100 mg total) by mouth 2 (two) times daily.  180 tablet  1  . Lactase (DAIRY-RELIEF PO) Take 3 tablets by mouth 3 (three) times daily.       Marland Kitchen levETIRAcetam (KEPPRA) 750 MG tablet Take 1,500 mg by mouth 2 (two) times daily.       . mercaptopurine (PURINETHOL) 50 MG tablet Take 2 tablets (100 mg total) by mouth daily. Take two tablets (100 mg total) daily. Give on an empty stomach 1 hour before or 2 hours after meals. Caution: Chemotherapy.  180 tablet  3  . Mesalamine (ASACOL HD) 800 MG TBEC Take 2 tablets (1,600 mg total) by mouth 3 (three) times daily.  270 tablet  1  . metFORMIN (GLUCOPHAGE) 500 MG tablet Take 2 tablets (1,000 mg total) by mouth every evening. Take two 500 mg (1000 mg total) in the evening with dinner  180 tablet  0  . methscopolamine (PAMINE FORTE) 5 MG tablet Take 1 tablet (5 mg total) by mouth 2 (two) times daily.  180 tablet  3  . predniSONE (DELTASONE) 10 MG tablet Take as directed.  100 tablet  1  . tamsulosin (FLOMAX) 0.4 MG CAPS Take 0.4 mg by mouth daily.      . valsartan (DIOVAN) 160 MG tablet Take 160 mg by mouth daily.       No current facility-administered medications on file prior to visit.    Review of Systems:  As per HPI, otherwise negative.    Physical Examination: Filed Vitals:   10/29/12 1714  BP: 116/72  Pulse: 105  Temp: 98.6 F (37 C)  Resp: 16   Filed Vitals:   10/29/12 1714  Height: 5' 11"  (1.803 m)  Weight: 258 lb (117.028 kg)   Body mass index is 36 kg/(m^2). Ideal Body Weight: Weight in (lb) to have BMI = 25: 178.9  GEN: WDWN, NAD, Non-toxic, A & O x 3 HEENT: Atraumatic,  Normocephalic. Neck supple. No masses, No LAD. Ears and Nose: No external deformity. CV: RRR, No M/G/R. No JVD.  No thrill. No extra heart sounds. PULM: CTA B, no wheezes, crackles, rhonchi. No retractions. No resp. distress. No accessory muscle use. ABD: S, generalized abdominal tenderness worse in RLQ and LUQ, ND, +BS. No rebound. No HSM. EXTR: No c/c/e NEURO Normal gait.  PSYCH: Normally interactive. Conversant. Not depressed or anxious appearing.  Calm demeanor.    Assessment and Plan: Abdominal pain Crohn's  Steroid dependent TO ER due to inability to obtain reasonable time CBC and need for imaging.   Signed,  Ellison Carwin, MD

## 2012-10-30 NOTE — Progress Notes (Signed)
Reviewed and agree.

## 2012-11-05 ENCOUNTER — Encounter: Payer: Self-pay | Admitting: Family Medicine

## 2012-11-05 ENCOUNTER — Ambulatory Visit: Payer: 59 | Admitting: Family Medicine

## 2012-11-05 VITALS — BP 114/73 | HR 97 | Temp 97.0°F | Resp 22 | Ht 70.0 in | Wt 258.0 lb

## 2012-11-05 DIAGNOSIS — E119 Type 2 diabetes mellitus without complications: Secondary | ICD-10-CM

## 2012-11-05 DIAGNOSIS — R079 Chest pain, unspecified: Secondary | ICD-10-CM

## 2012-11-05 LAB — LIPID PANEL
Cholesterol: 209 mg/dL — ABNORMAL HIGH (ref 0–200)
HDL: 39 mg/dL — ABNORMAL LOW (ref >39)
LDL Cholesterol: 107 mg/dL — ABNORMAL HIGH (ref 0–99)
Total CHOL/HDL Ratio: 5.4 Ratio
Triglycerides: 315 mg/dL — ABNORMAL HIGH (ref <150)
VLDL: 63 mg/dL — ABNORMAL HIGH (ref 0–40)

## 2012-11-05 LAB — COMPREHENSIVE METABOLIC PANEL
ALT: 59 U/L — ABNORMAL HIGH (ref 0–53)
AST: 27 U/L (ref 0–37)
Albumin: 3.9 g/dL (ref 3.5–5.2)
Alkaline Phosphatase: 64 U/L (ref 39–117)
BUN: 17 mg/dL (ref 6–23)
CO2: 20 mEq/L (ref 19–32)
Calcium: 8.9 mg/dL (ref 8.4–10.5)
Chloride: 104 mEq/L (ref 96–112)
Creat: 1.16 mg/dL (ref 0.50–1.35)
Glucose, Bld: 170 mg/dL — ABNORMAL HIGH (ref 70–99)
Potassium: 4 mEq/L (ref 3.5–5.3)
Sodium: 138 mEq/L (ref 135–145)
Total Bilirubin: 0.6 mg/dL (ref 0.3–1.2)
Total Protein: 6.2 g/dL (ref 6.0–8.3)

## 2012-11-05 LAB — POCT GLYCOSYLATED HEMOGLOBIN (HGB A1C): Hemoglobin A1C: 8.4

## 2012-11-05 LAB — GLUCOSE, POCT (MANUAL RESULT ENTRY): POC Glucose: 177 mg/dl — AB (ref 70–99)

## 2012-11-05 NOTE — Progress Notes (Signed)
This 52 year old complicated gentleman with Crohn's disease, seizure disorder, and diabetes. We have been managing his diabetes while Dr. Krista Blue is managed the seizure disorder and Dr. Fuller Plan is managing Crohn's disease.  Reviewing his history, we find Crohn's disease is chronic and fluctuating. He seems to have done better with the prednisone than the Entocort.  In particular, when he takes the Entocort, his blood sugars are uncontrollable. More recently he's had to take prednisone and his blood sugars have been under 100 frequently. The range lately has been anywhere from 80 to 180s fasting. He is scheduled to have an appointment Dr. Fuller Plan next week and anticipates a followup colonoscope examination the summer.  Over the last 8 months patient has had seizure-like activity and Dr. Jenny Reichmann has been managing this. He has noted significant improvement with the latest addition of seizure medicine Vimpat to the Keppra. Overall, patient is more clear headed on this new regimen.  Regarding the diabetes, the patient has had blood sugars as low as 80 as high as 180. As mentioned above, blood sugars are generally better and he had no readings over 200 in the last month or 2. He has had some lightheaded episodes on the Lantus 40 units daily. His main issue with her diabetes seems to be the stress of his job in the hours that he works. 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  Patient's personal life seems to be a bit stressful as well. He has a son and a daughter, the former is a special needs child at age 73 and the latter is a Electronics engineer at [employer] in [place] and is doing well. His son requires supervision and lives at home. Fortunately, the patient's wife is a great support and is accompanying him today.  One week ago patient was seen in the emergency room for sharp, pleuritic chest pain that began in his right parasternal  area and radiated to his right lower drip is in the back. A CAT scan was done of his abdomen and was negative. There is no chest x-ray, EKG, or d-dimer done. The pain is resolved and patient does not have any lower extremity tenderness or pain. Furthermore he has no swelling of his feet. During the emergency room evaluation, patient was given fentanyl and he was sent home with pain medicine. As he prepared for bed, patient had a syncopal episode and struck his head with subsequent confusion and blurry vision which persisted for several days. This is cleared at this point and he has no headache or stiff neck today  Objective: No acute distress, obese and mildly plethoric HEENT: Status post cataract procedure with normal appearing fundi, hearing normal, oropharynx clear, EOM intact Neck: Supple no adenopathy or thyromegaly Chest: Clear to auscultation Heart: Regular, rapid at about 100 beats per minute, 1/6 systolic murmur best heard at the right sternal border without gallop or rub Abdomen: Soft, protuberant with umbilical hernia, minimally tender with hyperactive bowel sounds Skin: No rash, microfilament done today shows no numbness or loss of sensation in the feet, there no skin breaks or calluses on his feet  Results for orders placed in visit on 11/05/12  POCT GLYCOSYLATED HEMOGLOBIN (HGB A1C)      Result Value Range   Hemoglobin A1C 8.4    GLUCOSE, POCT (MANUAL RESULT ENTRY)      Result Value Range   POC Glucose 177 (*) 70 - 99 mg/dl  Assessment: Complicated diabetic with recent improvement in blood sugar pulse while on prednisone. The pleuritic chest pain he experienced last week when unexplained but was suggestive of pulmonary embolus. I've instructed patient to report any further chest pain, shortness of breath or leg pain immediately.  Plan: Best patient to reduce his Lantus to 35 units daily, recheck in 3 months, and followup with his consultants as noted above. Diabetes - Plan: POCT  glycosylated hemoglobin (Hb A1C), POCT glucose (manual entry)  Chest pain - Plan: Lipid panel, Comprehensive metabolic panel, Homocysteine  [name], MD

## 2012-11-06 ENCOUNTER — Other Ambulatory Visit: Payer: Self-pay | Admitting: Family Medicine

## 2012-11-06 DIAGNOSIS — R7989 Other specified abnormal findings of blood chemistry: Secondary | ICD-10-CM

## 2012-11-06 LAB — HOMOCYSTEINE: Homocysteine: 12.2 umol/L (ref 4.0–15.4)

## 2012-11-06 MED ORDER — FOLIC ACID 1 MG PO TABS: 2.0000 mg | ORAL_TABLET | Freq: Every day | ORAL | Status: DC

## 2012-11-09 ENCOUNTER — Encounter: Payer: Self-pay | Admitting: Gastroenterology

## 2012-11-09 ENCOUNTER — Ambulatory Visit (INDEPENDENT_AMBULATORY_CARE_PROVIDER_SITE_OTHER): Payer: 59 | Admitting: Gastroenterology

## 2012-11-09 VITALS — BP 130/90 | HR 78 | Wt 257.2 lb

## 2012-11-09 DIAGNOSIS — K5080 Crohn's disease of both small and large intestine without complications: Secondary | ICD-10-CM

## 2012-11-09 DIAGNOSIS — R109 Unspecified abdominal pain: Secondary | ICD-10-CM

## 2012-11-09 MED ORDER — MESALAMINE 800 MG PO TBEC: 2.0000 | DELAYED_RELEASE_TABLET | Freq: Three times a day (TID) | ORAL | Status: DC

## 2012-11-09 MED ORDER — PEG-KCL-NACL-NASULF-NA ASC-C 100 G PO SOLR: 1.0000 | Freq: Once | ORAL | Status: DC

## 2012-11-09 NOTE — Patient Instructions (Addendum)
You have been scheduled for an abdominal ultrasound at [facility] (1st floor of hospital) on 5/22 at 7:30am. Please arrive 15 minutes prior to your appointment for registration. Make certain not to have anything to eat or drink 6 hours prior to your appointment. Should you need to reschedule your appointment, please contact radiology at [phone]. This test typically takes about 30 minutes to perform.  You have been scheduled for a colonoscopy with propofol. Please follow written instructions given to you at your visit today.  Please pick up your prep kit at the pharmacy within the next 1-3 days. If you use inhalers (even only as needed), please bring them with you on the day of your procedure. Your physician has requested that you go to www.startemmi.com and enter the access code given to you at your visit today. This web site gives a general overview about your procedure. However, you should still follow specific instructions given to you by our office regarding your preparation for the procedure.  We have sent the following medications to your pharmacy: Asacol.  Resume Entocort 71m daily.  Decrease prednisone to 10 mg daily x 7 days, then decrease to 10 mg every other day x 7 days then stop.  Thank you for choosing me and [facility].  MPricilla Riffle SDagoberto Ligas, MD., FMarval Regal

## 2012-11-09 NOTE — Progress Notes (Signed)
History of Present Illness: This is a 52 year old male accompanied by his wife. He was seen in our office and then in the emergency department for abdominal pain and bloody diarrhea felt secondary to a Crohn's flare. He was put on prednisone taper and his bleeding has resolved and his abdominal pain has improved. His diarrhea has improved but has not returned to baseline. He has persistent epigastric pain. He relates several days of right-sided chest pain worsened with deep breaths that occurred around the same time but these symptoms have resolved. He was evaluated by Dr. Joseph Art for the symptoms.  Current Medications, Allergies, Past Medical History, Past Surgical History, Family History and Social History were reviewed in [employer] record.  Physical Exam: General: Well developed , well nourished, no acute distress Head: Normocephalic and atraumatic Eyes:  sclerae anicteric, EOMI Ears: Normal auditory acuity Mouth: No deformity or lesions Lungs: Clear throughout to auscultation Heart: Regular rate and rhythm; no murmurs, rubs or bruits Abdomen: Soft, mild epigastric tenderness to deep palpation without rebound or guarding and non distended. No masses, hepatosplenomegaly or hernias noted. Normal Bowel sounds Musculoskeletal: Symmetrical with no gross deformities  Pulses:  Normal pulses noted Extremities: No clubbing, cyanosis, edema or deformities noted Neurological: Alert oriented x 4, grossly nonfocal Psychological:  Alert and cooperative. Normal mood and affect  Assessment and Recommendations:  Crohn's ileocolitis has been well controlled over the past several years with acute abdominal pain cramping diarrhea and hematochezia which was treated with prednisone as an exacerbation of his Crohn's. All symptoms have improved except for the diarrhea and epigastric pain.  Plan: Resume Entocort 9 mg daily and taper prednisone.  Continue 6-MP 100 mg by mouth daily   Continue Asacol 1.6 g 3 times daily  Schedule abdominal ultrasound to rule out cholelithiasis He is scheduled for routine colonoscopy in late June  2014, and we will try to move this to an earlier date. The risks, benefits, and alternatives to colonoscopy with possible biopsy and possible polypectomy were discussed with the patient and they consent to proceed.

## 2012-11-12 ENCOUNTER — Ambulatory Visit (HOSPITAL_COMMUNITY)
Admission: RE | Admit: 2012-11-12 | Discharge: 2012-11-12 | Disposition: A | Discharge status: Home / Self Care | Payer: 59 | Source: Ambulatory Visit | Attending: Gastroenterology | Admitting: Gastroenterology

## 2012-11-12 DIAGNOSIS — R109 Unspecified abdominal pain: Secondary | ICD-10-CM

## 2012-11-12 DIAGNOSIS — N200 Calculus of kidney: Secondary | ICD-10-CM | POA: Insufficient documentation

## 2012-11-12 DIAGNOSIS — N281 Cyst of kidney, acquired: Secondary | ICD-10-CM | POA: Insufficient documentation

## 2012-11-13 ENCOUNTER — Other Ambulatory Visit: Payer: Self-pay | Admitting: Family Medicine

## 2012-11-13 DIAGNOSIS — E109 Type 1 diabetes mellitus without complications: Secondary | ICD-10-CM

## 2012-11-13 MED ORDER — INSULIN GLARGINE 100 UNIT/ML ~~LOC~~ SOLN: 35.0000 [IU] | Freq: Every day | SUBCUTANEOUS | Status: DC

## 2012-11-13 MED ORDER — POTASSIUM CHLORIDE CRYS ER 20 MEQ PO TBCR: 20.0000 meq | EXTENDED_RELEASE_TABLET | Freq: Two times a day (BID) | ORAL | Status: DC

## 2012-11-13 MED ORDER — METFORMIN HCL 500 MG PO TABS: 1000.0000 mg | ORAL_TABLET | Freq: Every evening | ORAL | Status: DC

## 2012-11-13 MED ORDER — DILTIAZEM HCL ER BEADS 240 MG PO CP24: 240.0000 mg | ORAL_CAPSULE | Freq: Every day | ORAL | Status: DC

## 2012-11-13 MED ORDER — GLUCOSE BLOOD VI STRP: ORAL_STRIP | Status: DC

## 2012-11-13 MED ORDER — VALSARTAN 160 MG PO TABS: 160.0000 mg | ORAL_TABLET | Freq: Every day | ORAL | Status: DC

## 2012-11-13 MED ORDER — GLIMEPIRIDE 2 MG PO TABS: 2.0000 mg | ORAL_TABLET | Freq: Every day | ORAL | Status: DC

## 2012-11-15 ENCOUNTER — Other Ambulatory Visit: Payer: Self-pay | Admitting: Family Medicine

## 2012-11-15 DIAGNOSIS — G43909 Migraine, unspecified, not intractable, without status migrainosus: Secondary | ICD-10-CM

## 2012-11-15 MED ORDER — ELETRIPTAN HYDROBROMIDE 40 MG PO TABS: ORAL_TABLET | ORAL | Status: DC

## 2012-11-16 ENCOUNTER — Ambulatory Visit: Payer: 59 | Admitting: Family Medicine

## 2012-11-16 ENCOUNTER — Ambulatory Visit: Payer: 59

## 2012-11-16 VITALS — BP 122/72 | HR 90 | Temp 97.8°F | Resp 18 | Ht 70.0 in | Wt 258.0 lb

## 2012-11-16 DIAGNOSIS — M542 Cervicalgia: Secondary | ICD-10-CM

## 2012-11-16 DIAGNOSIS — E119 Type 2 diabetes mellitus without complications: Secondary | ICD-10-CM

## 2012-11-16 MED ORDER — INSULIN GLARGINE 100 UNIT/ML SOLOSTAR PEN: 35.0000 [IU] | PEN_INJECTOR | Freq: Every day | SUBCUTANEOUS | Status: DC

## 2012-11-16 MED ORDER — CYCLOBENZAPRINE HCL 10 MG PO TABS: ORAL_TABLET | ORAL | Status: DC

## 2012-11-16 NOTE — Progress Notes (Signed)
This is a 52 year old gentleman with poorly controlled diabetes, ulcerative colitis, and a tremendous amount of stress from work. 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  The situation is that patient fell after being given narcotics in the emergency room back on May 8. He became lightheaded and fell backwards striking the floor of the back of his head. At that time he did not think there was a significant injury but he was on large dose of steroids. Subsequently, his gastroenterologist began tapering his steroids and put him back on Entocort and his neck a certain heard. He is known to have osteoporosis and is concerned that he may broken a bone in his neck.  In addition the patient's noticing some paresthesias going down his right arm and into the dorsal middle part of his hand. He has soreness in the right posterior neck with any kind of neck movement or palpation of the right posterior neck.  In addition, patient notes that his blood sugars are started rising since he got back on Entocort and started tapering the prednisone.  Objective: No acute distress Neck range of motion: Diminished flexion rotation and extension. He is able to move 20-30 each one of those directions, however. He has mild tenderness in the right posterior paraspinal muscle group.  Reflexes in the biceps and triceps areas bilaterally are normal. He has good deltoid strength, good biceps strength, and good grasps.  [facility] reading (PRIMARY) by  Dr. Joseph Art:  c-spine films show early spondylosis at C5 only.  Good foraminal openings, good alignment, no obvious esophageal displacement, no fractures.  Assessment:  Neck sprain  Plan: Neck pain - Plan: DG Cervical Spine Complete, cyclobenzaprine (FLEXERIL) 10 MG tablet  Diabetes - Plan: Insulin Glargine (LANTUS SOLOSTAR) 100 UNIT/ML SOPN, DISCONTINUED: Insulin Glargine (LANTUS SOLOSTAR) 100 UNIT/ML SOPN  .[name], MD

## 2012-11-24 ENCOUNTER — Encounter: Payer: 59 | Admitting: Gastroenterology

## 2012-12-05 ENCOUNTER — Other Ambulatory Visit: Payer: Self-pay | Admitting: Family Medicine

## 2012-12-09 ENCOUNTER — Ambulatory Visit (INDEPENDENT_AMBULATORY_CARE_PROVIDER_SITE_OTHER): Payer: 59 | Admitting: Family Medicine

## 2012-12-09 VITALS — BP 138/80 | HR 97 | Temp 97.9°F | Resp 16 | Ht 71.0 in | Wt 258.0 lb

## 2012-12-09 DIAGNOSIS — R42 Dizziness and giddiness: Secondary | ICD-10-CM

## 2012-12-09 DIAGNOSIS — A4902 Methicillin resistant Staphylococcus aureus infection, unspecified site: Secondary | ICD-10-CM

## 2012-12-09 LAB — POCT URINALYSIS DIPSTICK
Bilirubin, UA: NEGATIVE
Blood, UA: NEGATIVE
Glucose, UA: NEGATIVE
Ketones, UA: NEGATIVE
Leukocytes, UA: NEGATIVE
Nitrite, UA: NEGATIVE
Protein, UA: NEGATIVE
Spec Grav, UA: 1.025
Urobilinogen, UA: 0.2
pH, UA: 5.5

## 2012-12-09 LAB — POCT GLYCOSYLATED HEMOGLOBIN (HGB A1C): Hemoglobin A1C: 8

## 2012-12-09 LAB — POCT CBC
Granulocyte percent: 70 %G (ref 37–80)
HCT, POC: 40.3 % — AB (ref 43.5–53.7)
Hemoglobin: 12.7 g/dL — AB (ref 14.1–18.1)
Lymph, poc: 1.4 (ref 0.6–3.4)
MCH, POC: 32.9 pg — AB (ref 27–31.2)
MCHC: 31.5 g/dL — AB (ref 31.8–35.4)
MCV: 104.4 fL — AB (ref 80–97)
MID (cbc): 0.6 (ref 0–0.9)
MPV: 8.9 fL (ref 0–99.8)
POC Granulocyte: 4.6 (ref 2–6.9)
POC LYMPH PERCENT: 21 %L (ref 10–50)
POC MID %: 9 %M (ref 0–12)
Platelet Count, POC: 216 10*3/uL (ref 142–424)
RBC: 3.86 M/uL — AB (ref 4.69–6.13)
RDW, POC: 15.5 %
WBC: 6.6 10*3/uL (ref 4.6–10.2)

## 2012-12-09 LAB — POCT UA - MICROSCOPIC ONLY
Bacteria, U Microscopic: NEGATIVE
Casts, Ur, LPF, POC: NEGATIVE
Crystals, Ur, HPF, POC: NEGATIVE
Mucus, UA: NEGATIVE
Yeast, UA: NEGATIVE

## 2012-12-09 LAB — POCT SEDIMENTATION RATE: POCT SED RATE: 20 mm/hr (ref 0–22)

## 2012-12-09 MED ORDER — CEFDINIR 300 MG PO CAPS: 300.0000 mg | ORAL_CAPSULE | Freq: Two times a day (BID) | ORAL | Status: DC

## 2012-12-09 NOTE — Progress Notes (Signed)
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  He also has a pustule on left biceps area since yesterday, with h/o MRSA, and he attempted to "pop" the pustule last night.  Having chills off and on all day.  Using the one touch ultra.  He recently had URI  Sugars have been better since he stopped the entocort  Objective:  Laying supine on exam table, alert, appropriate.  HEENT:  Unremarkable Neck: somewhat sore but supple Chest: clear Heart:  Regular, no murmur Abdomen:  Tender epigastrium and LLQ with deep palpation, soft, no HSM Ext:  Small 1/2 cm scabbed pustule left biceps  No edema Results for orders placed in visit on 12/09/12  POCT CBC      Result Value Range   WBC 6.6  4.6 - 10.2 K/uL   Lymph, poc 1.4  0.6 - 3.4   POC LYMPH PERCENT 21.0  10 - 50 %L   MID (cbc) 0.6  0 - 0.9   POC MID % 9.0  0 - 12 %M   POC Granulocyte 4.6  2 - 6.9   Granulocyte percent 70.0  37 - 80 %G   RBC 3.86 (*) 4.69 - 6.13 M/uL   Hemoglobin 12.7 (*) 14.1 - 18.1 g/dL   HCT, POC 40.3 (*) 43.5 - 53.7 %   MCV 104.4 (*) 80 - 97 fL   MCH, POC 32.9 (*) 27 - 31.2 pg   MCHC 31.5 (*) 31.8 - 35.4 g/dL   RDW, POC 15.5     Platelet Count, POC 216  142 - 424 K/uL   MPV 8.9  0 - 99.8 fL  POCT URINALYSIS DIPSTICK      Result Value Range   Color, UA yellow     Clarity, UA clear     Glucose, UA neg     Bilirubin, UA neg     Ketones, UA neg     Spec Grav, UA 1.025     Blood, UA neg     pH, UA 5.5     Protein, UA neg     Urobilinogen, UA 0.2     Nitrite, UA neg     Leukocytes, UA Negative    POCT UA - MICROSCOPIC ONLY      Result Value Range   WBC, Ur, HPF, POC 0-1     RBC, urine, microscopic 0-1     Bacteria, U Microscopic neg     Mucus, UA neg     Epithelial cells, urine per micros 0-1     Crystals, Ur, HPF, POC neg     Casts, Ur, LPF, POC neg     Yeast, UA neg    POCT GLYCOSYLATED HEMOGLOBIN (HGB A1C)      Result Value Range   Hemoglobin  A1C 8.0       Assessment:  Acute illness with probable early MRSA on arm.  Plan:  cefdinir

## 2012-12-10 LAB — COMPREHENSIVE METABOLIC PANEL
ALT: 98 U/L — ABNORMAL HIGH (ref 0–53)
AST: 54 U/L — ABNORMAL HIGH (ref 0–37)
Albumin: 3.7 g/dL (ref 3.5–5.2)
Alkaline Phosphatase: 72 U/L (ref 39–117)
BUN: 12 mg/dL (ref 6–23)
CO2: 26 mEq/L (ref 19–32)
Calcium: 9.5 mg/dL (ref 8.4–10.5)
Chloride: 103 mEq/L (ref 96–112)
Creat: 0.84 mg/dL (ref 0.50–1.35)
Glucose, Bld: 123 mg/dL — ABNORMAL HIGH (ref 70–99)
Potassium: 3.6 mEq/L (ref 3.5–5.3)
Sodium: 137 mEq/L (ref 135–145)
Total Bilirubin: 0.6 mg/dL (ref 0.3–1.2)
Total Protein: 6 g/dL (ref 6.0–8.3)

## 2012-12-17 ENCOUNTER — Encounter: Payer: 59 | Admitting: Gastroenterology

## 2012-12-20 ENCOUNTER — Ambulatory Visit: Payer: 59 | Admitting: Family Medicine

## 2012-12-20 ENCOUNTER — Ambulatory Visit: Payer: 59

## 2012-12-20 VITALS — BP 104/72 | HR 93 | Temp 98.2°F | Resp 18 | Ht 70.25 in | Wt 253.6 lb

## 2012-12-20 DIAGNOSIS — R42 Dizziness and giddiness: Secondary | ICD-10-CM

## 2012-12-20 DIAGNOSIS — R05 Cough: Secondary | ICD-10-CM

## 2012-12-20 DIAGNOSIS — R079 Chest pain, unspecified: Secondary | ICD-10-CM

## 2012-12-20 DIAGNOSIS — R9431 Abnormal electrocardiogram [ECG] [EKG]: Secondary | ICD-10-CM

## 2012-12-20 DIAGNOSIS — R82998 Other abnormal findings in urine: Secondary | ICD-10-CM

## 2012-12-20 DIAGNOSIS — E119 Type 2 diabetes mellitus without complications: Secondary | ICD-10-CM

## 2012-12-20 DIAGNOSIS — R109 Unspecified abdominal pain: Secondary | ICD-10-CM

## 2012-12-20 DIAGNOSIS — R0600 Dyspnea, unspecified: Secondary | ICD-10-CM

## 2012-12-20 DIAGNOSIS — R8281 Pyuria: Secondary | ICD-10-CM

## 2012-12-20 DIAGNOSIS — R0609 Other forms of dyspnea: Secondary | ICD-10-CM

## 2012-12-20 DIAGNOSIS — R059 Cough, unspecified: Secondary | ICD-10-CM

## 2012-12-20 LAB — POCT CBC
HCT, POC: 46.1 % (ref 43.5–53.7)
Hemoglobin: 14.7 g/dL (ref 14.1–18.1)
Lymph, poc: 1.6 (ref 0.6–3.4)
MCH, POC: 33.6 pg — AB (ref 27–31.2)
MPV: 9.4 fL (ref 0–99.8)
POC MID %: 10.6 %M (ref 0–12)
RBC: 4.37 M/uL — AB (ref 4.69–6.13)
WBC: 7.4 10*3/uL (ref 4.6–10.2)

## 2012-12-20 LAB — GLUCOSE, POCT (MANUAL RESULT ENTRY): POC Glucose: 111 mg/dl — AB (ref 70–99)

## 2012-12-20 LAB — POCT URINALYSIS DIPSTICK
Bilirubin, UA: NEGATIVE
Spec Grav, UA: >=1.03

## 2012-12-20 LAB — BASIC METABOLIC PANEL
CO2: 24 mEq/L (ref 19–32)
Chloride: 105 mEq/L (ref 96–112)
Creat: 0.79 mg/dL (ref 0.50–1.35)
Glucose, Bld: 116 mg/dL — ABNORMAL HIGH (ref 70–99)

## 2012-12-20 LAB — POCT UA - MICROSCOPIC ONLY: Crystals, Ur, HPF, POC: NEGATIVE

## 2012-12-20 LAB — D-DIMER, QUANTITATIVE: D-Dimer, Quant: 0.32 ug/mL-FEU (ref 0.00–0.48)

## 2012-12-20 LAB — PSA: PSA: 0.24 ng/mL (ref <=4.00)

## 2012-12-20 MED ORDER — HYDROCODONE-HOMATROPINE 5-1.5 MG/5ML PO SYRP: ORAL_SOLUTION | ORAL | Status: DC

## 2012-12-20 MED ORDER — BENZONATATE 100 MG PO CAPS: 100.0000 mg | ORAL_CAPSULE | Freq: Three times a day (TID) | ORAL | Status: DC | PRN

## 2012-12-20 NOTE — Patient Instructions (Addendum)
Frequent sips of fluids for dehydration.  Call your GI doctor in the am to discuss the worsening Crohn's symptoms.  Tessalon if needed for cough, or hydrocodone syrup if needed at bedtime. Do not take this if short of breath at the time.  To ER or call 911 if any worsening of chest pain or shortness of breath.You should receive a call or letter about your lab results within the next few days.

## 2012-12-20 NOTE — Progress Notes (Signed)
Subjective:    Patient ID: [name] [name], male    DOB: 04/01/1961, 52 y.o.   MRN: [id number]  HPI [name] [name] is a 52 y.o. male PCP: [place].  Cold symptoms - seen by Dr. Joseph Art 10 days ago (12/09/12) for cough and congestion in throat and into chest. Not improving with antibiotic (cefdinir).   Cough has persisted to soreness with cough. Cough not improving, feeling dizzy over past 10 days - feels like getting worse with walking - feels like going to pass out, but no true syncope. Last 4-5 days having chest pains in middle of chest - more sore with cough.  Shortness of breath seems to be there over past 7-8 days. No known fever. No hx of CHF.  No known personal hx of heart disease. No hx of anemia but takes iron?  Last stress test 2 years ago - normal. Soreness in chest noted with cough.   Hx of Crohn's. Recurrent diarrhea, may have been worse past 10 days - diarrhea with any food intake. GI: [facility]. Takes Asacol, Pamine Forte, No recent prolonged car travel or air travel, no recent calf pain or swelling. Last saw Dr. Fuller Plan in May - prednisone started then for diarrhea. Less urination past 10 days.  Has not called Dr. Lynne Leader office with current episode of increased diarrhea. Vomited once 7 and 5 days ago after coughing fits only, no other isolated vomiting.   Hx of partial complex seizures - not sure if these have happened in past week - but none witnessed.  Takes Keppra and Vimpat, neuro - Dr. Krista Blue.    Past Medical History  Diagnosis Date  . Hypertension   . GERD (gastroesophageal reflux disease)   . Adenomatous colon polyp 10/1999  . Osteoporosis   . External hemorrhoids   . Crohn's disease of small and large intestines 1999  . Pneumonia   . Esophageal stricture   . MRSA (methicillin resistant Staphylococcus aureus) 10/2010   . Diabetes mellitus   . Hyperlipemia   . Cancer     skin  . Kidney stones   . Sleep apnea     CPAP machine  . PONV (postoperative nausea and vomiting)    . Complex partial seizure   . Migraines   . Seizures   . Cataract    Allergies  Allergen Reactions  . Azithromycin Swelling  . Dilaudid (Hydromorphone Hcl) Other (See Comments)    n/v  . Penicillins Hives  . Sulfonamide Derivatives Hives   Prior to Admission medications   Medication Sig Start Date End Date Taking? Authorizing Provider  aspirin 81 MG tablet Take 81 mg by mouth daily.   Yes Historical Provider, MD  CALCIUM PO Take 1 tablet by mouth 3 (three) times daily.    Yes Historical Provider, MD  cefdinir (OMNICEF) 300 MG capsule Take 1 capsule (300 mg total) by mouth 2 (two) times daily. 12/09/12  Yes [name], MD  Cyanocobalamin (VITAMIN B 12 PO) Take 1 tablet by mouth daily.   Yes Historical Provider, MD  cyclobenzaprine (FLEXERIL) 10 MG tablet Take one qhs for neck spasm 11/16/12  Yes [name], MD  diltiazem [facility]) 240 MG 24 hr capsule Take 1 capsule (240 mg total) by mouth daily. 11/13/12  Yes [name], MD  eletriptan (RELPAX) 40 MG tablet One tablet by mouth at onset of headache. May repeat in 2 hours if headache persists or recurs. x 11/15/12  Yes [name], MD  EPINEPHrine (EPIPEN) 0.3 mg/0.3 mL  DEVI Inject 0.3 mLs (0.3 mg total) into the muscle once. 02/26/12  Yes Wardell Honour, MD  esomeprazole (NEXIUM) 40 MG capsule Take 40 mg by mouth daily before breakfast.   Yes Historical Provider, MD  folic acid (FOLVITE) 1 MG tablet Take 2 tablets (2 mg total) by mouth daily. 11/06/12  Yes [name], MD  glimepiride (AMARYL) 2 MG tablet Take 1 tablet (2 mg total) by mouth daily before breakfast. 11/13/12  Yes [name], MD  glucose blood (CHOICE DM FORA G20 TEST STRIPS) test strip Use as instructed 11/13/12 11/13/13 Yes [name], MD  HYDROcodone-acetaminophen (NORCO/VICODIN) 5-325 MG per tablet Take 1 tablet by mouth every 6 (six) hours as needed for pain.   Yes Historical Provider, MD  Insulin Glargine (LANTUS SOLOSTAR) 100 UNIT/ML SOPN Inject  35 Units into the skin daily. 11/16/12  Yes [name], MD  Lacosamide (VIMPAT) 100 MG TABS Take 100 mg by mouth 2 (two) times daily.   Yes Historical Provider, MD  Lactase (DAIRY-RELIEF PO) Take 3 tablets by mouth 3 (three) times daily.    Yes Historical Provider, MD  levETIRAcetam (KEPPRA) 750 MG tablet Take 1,500 mg by mouth 2 (two) times daily.    Yes Historical Provider, MD  mercaptopurine (PURINETHOL) 50 MG tablet Take 100 mg by mouth 2 (two) times daily. Give on an empty stomach 1 hour before or 2 hours after meals. Caution: Chemotherapy.   Yes Historical Provider, MD  Mesalamine (ASACOL HD) 800 MG TBEC Take 2 tablets (1,600 mg total) by mouth 3 (three) times daily. 11/09/12  Yes Ladene Artist, MD  metFORMIN (GLUCOPHAGE) 500 MG tablet Take 2 tablets (1,000 mg total) by mouth every evening. 11/13/12  Yes [name], MD  methscopolamine (PAMINE FORTE) 5 MG tablet Take 5 mg by mouth 2 (two) times daily.   Yes Historical Provider, MD  ondansetron (ZOFRAN ODT) 8 MG disintegrating tablet Take 1 tablet (8 mg total) by mouth every 8 (eight) hours as needed for nausea. 10/29/12  Yes Kathalene Frames, MD  ONE [facility] ULTRA TEST test strip USE AS INSTRUCTED 12/05/12  Yes Ryan M Dunn, PA-C  peg 3350 powder (MOVIPREP) 100 G SOLR Take 1 kit (100 g total) by mouth once. 11/09/12  Yes Ladene Artist, MD  potassium chloride SA (K-DUR,KLOR-CON) 20 MEQ tablet Take 1 tablet (20 mEq total) by mouth 2 (two) times daily. 11/13/12  Yes [name], MD  predniSONE (DELTASONE) 10 MG tablet Take 20 mg by mouth daily. Take as directed. 10/02/12  Yes Amy S Esterwood, PA-C  promethazine (PHENERGAN) 25 MG tablet Take 1 tablet (25 mg total) by mouth every 6 (six) hours as needed for nausea. 10/29/12  Yes Kathalene Frames, MD  saccharomyces boulardii (FLORASTOR) 250 MG capsule Take 250 mg by mouth 2 (two) times daily.   Yes Historical Provider, MD  tamsulosin (FLOMAX) 0.4 MG CAPS Take 0.4 mg by mouth as needed.    Yes Historical  Provider, MD  valsartan (DIOVAN) 160 MG tablet Take 1 tablet (160 mg total) by mouth daily. 11/13/12  Yes [name], MD  glucose blood (ONE TOUCH ULTRA TEST) test strip Use as instructed 09/05/11 09/04/12  Mancel Bale, PA-C   History   Social History  . Marital Status: Married    Spouse Name: N/A    Number of Children: N/A  . Years of Education: N/A   Occupational History  . [employer]   Social History Main Topics  .  Smoking status: Never Smoker   . Smokeless tobacco: Never Used  . Alcohol Use: No  . Drug Use: No  . Sexually Active: Yes    Birth Control/ Protection: Surgical     Comment: 1 partner in last 12 months   Other Topics Concern  . Not on file   Social History Narrative  . No narrative on file      Review of Systems  Constitutional: Negative for fever (feels warm.) and chills.  Respiratory: Positive for shortness of breath.   Cardiovascular: Positive for chest pain (with and w/o cough - center of chest. ). Negative for leg swelling.  Gastrointestinal: Positive for nausea, vomiting, abdominal pain (chronic, no changes. ) and diarrhea.  Musculoskeletal: Positive for arthralgias (chest with cough. ).  Skin: Negative for rash.       Objective:   Physical Exam  Vitals reviewed. Constitutional: He is oriented to person, place, and time. He appears well-developed and well-nourished.  HENT:  Head: Normocephalic and atraumatic.  Right Ear: Tympanic membrane, external ear and ear canal normal.  Left Ear: Tympanic membrane, external ear and ear canal normal.  Nose: No rhinorrhea.  Mouth/Throat: Oropharynx is clear and moist and mucous membranes are normal. No oropharyngeal exudate or posterior oropharyngeal erythema.  Eyes: Conjunctivae are normal. Pupils are equal, round, and reactive to light.  Neck: Neck supple.  Cardiovascular: Regular rhythm, normal heart sounds and intact distal pulses.   No extrasystoles are present.  Tachycardia present.   No murmur heard. Pulmonary/Chest: Effort normal and breath sounds normal. He has no wheezes. He has no rhonchi. He has no rales.  Abdominal: Soft. There is generalized tenderness (chronic, unchanged per pt. ). There is no CVA tenderness.  Lymphadenopathy:    He has no cervical adenopathy.  Neurological: He is alert and oriented to person, place, and time. He has normal strength. No sensory deficit. GCS eye subscore is 4. GCS verbal subscore is 5. GCS motor subscore is 6.  Nonfocal.   Skin: Skin is warm and dry. No rash noted.  Psychiatric: He has a normal mood and affect. His behavior is normal. Judgment and thought content normal.   EKG: sinus tachycardia rate 104, twi in III, avf- ?old inferior infarct, but no acute changes compared to  07/24/12. Less dizzy after 3rd liter, chest clear.  Spent over 45 minutes face to face care with repeat exams, hx , a/p.   Results for orders placed in visit on 12/20/12  POCT CBC      Result Value Range   WBC 7.4  4.6 - 10.2 K/uL   Lymph, poc 1.6  0.6 - 3.4   POC LYMPH PERCENT 21.3  10 - 50 %L   MID (cbc) 0.8  0 - 0.9   POC MID % 10.6  0 - 12 %M   POC Granulocyte 5.0  2 - 6.9   Granulocyte percent 68.1  37 - 80 %G   RBC 4.37 (*) 4.69 - 6.13 M/uL   Hemoglobin 14.7  14.1 - 18.1 g/dL   HCT, POC 46.1  43.5 - 53.7 %   MCV 105.6 (*) 80 - 97 fL   MCH, POC 33.6 (*) 27 - 31.2 pg   MCHC 31.9  31.8 - 35.4 g/dL   RDW, POC 15.4     Platelet Count, POC 275  142 - 424 K/uL   MPV 9.4  0 - 99.8 fL  POCT UA - MICROSCOPIC ONLY      Result Value Range  WBC, Ur, HPF, POC 20-25     RBC, urine, microscopic 1-2     Bacteria, U Microscopic trace     Mucus, UA pos     Epithelial cells, urine per micros neg     Crystals, Ur, HPF, POC neg     Casts, Ur, LPF, POC wbc     Amorphous pos    POCT URINALYSIS DIPSTICK      Result Value Range   Color, UA orange     Clarity, UA slightly cloudy     Glucose, UA >=1000     Bilirubin, UA neg      Ketones, UA trace     Spec Grav, UA >=1.030     Blood, UA neg     pH, UA 5.5     Protein, UA trace     Urobilinogen, UA 0.2     Nitrite, UA neg     Leukocytes, UA Negative    GLUCOSE, POCT (MANUAL RESULT ENTRY)      Result Value Range   POC Glucose 111 (*) 70 - 99 mg/dl   [facility] reading (PRIMARY) by  Dr. Carlota Raspberry: CXR: nad  IV placed - 2 liters IVF - no need to urinate yet, improved dizziness, but still a little dizzy sitting up. Orthostatics appear ok. CXR report reviewed  No edema.  3rd liter given -- lungs clear after this, feels less lightheaded. Still does not feel need to urinate yet.       Assessment & Plan:  [name] [name] is a 52 y.o. male Cough - Plan: POCT CBC, Basic metabolic panel, DG Chest 2 View, EKG 12-Lead, POCT UA - Microscopic Only, POCT urinalysis dipstick  Dyspnea - Plan: POCT CBC, Basic metabolic panel, DG Chest 2 View, EKG 12-Lead, POCT UA - Microscopic Only, POCT urinalysis dipstick, D-dimer, quantitative, Brain natriuretic peptide  Chest pain - Plan: EKG 12-Lead, D-dimer, quantitative  Dizziness - Plan: POCT CBC, Basic metabolic panel, DG Chest 2 View, EKG 12-Lead, POCT UA - Microscopic Only, POCT urinalysis dipstick, D-dimer, quantitative  Abdominal pain, unspecified site - Plan: POCT CBC, Basic metabolic panel, DG Chest 2 View, EKG 12-Lead, POCT UA - Microscopic Only, POCT urinalysis dipstick  Diabetes - Plan: POCT glucose (manual entry)  Pyuria - Plan: Urine culture, PSA   Cough - s/p Omnicef.  ? Post infectious vs allergic vs LPR. No pleural edema or PNA on CXR, but will check BNP.  Doubt PE, but consider CT chest if DDimer elevated. Trial of tessalon perles, increase fluids, continue Nexium, and if not improving in next week - recheck. sooner if worse.   Chest pain - with cough, suspected MSK cause.  Unchanged EKG, but recommended cards follow up as abnormal EKG (stable from prior reading).  D dimer plan as above, RTC/ER/911 precautions.    Dizziness/fatigue with volume depletion. IVF given in office with improvement. Will check BMP.   Crohn's with recent decreased control, diarrhea - and subsequent volume depletion.  To call GI in am to decide on OV or if prednisone needs to be restarted.   WBC on U/a - but asx.  Check urine cx, PSA, rtc precautions.   Meds ordered this encounter  Medications  . benzonatate (TESSALON) 100 MG capsule    Sig: Take 1 capsule (100 mg total) by mouth 3 (three) times daily as needed for cough.    Dispense:  20 capsule    Refill:  0    Patient Instructions  Frequent sips of fluids for  dehydration.  Call your GI doctor in the am to discuss the worsening Crohn's symptoms.  Tessalon if needed for cough. To ER or call 911 if any worsening of chest pain or shortness of breath.  You should receive a call or letter about your lab results within the next few days.     Addendum: D Dimer within normal limits.  treatment as above, will hold on CT at this point, but rtc/er precautions discussed as above.

## 2012-12-22 ENCOUNTER — Other Ambulatory Visit: Payer: Self-pay | Admitting: Radiology

## 2012-12-22 LAB — URINE CULTURE
Colony Count: NO GROWTH
Organism ID, Bacteria: NO GROWTH

## 2012-12-22 MED ORDER — FOLIC ACID 1 MG PO TABS: 2.0000 mg | ORAL_TABLET | Freq: Every day | ORAL | Status: DC

## 2012-12-22 NOTE — Telephone Encounter (Signed)
Sent in

## 2012-12-23 ENCOUNTER — Ambulatory Visit (INDEPENDENT_AMBULATORY_CARE_PROVIDER_SITE_OTHER): Payer: 59 | Admitting: Family Medicine

## 2012-12-23 VITALS — BP 102/68 | HR 98 | Temp 97.9°F | Resp 18 | Ht 70.0 in | Wt 257.0 lb

## 2012-12-23 DIAGNOSIS — J209 Acute bronchitis, unspecified: Secondary | ICD-10-CM

## 2012-12-23 MED ORDER — PREDNISONE 20 MG PO TABS: ORAL_TABLET | ORAL | Status: DC

## 2012-12-23 NOTE — Progress Notes (Signed)
Patient ID: [name] [name] MRN: [id number], DOB: [birth year], 52 y.o. Date of Encounter: 12/23/2012, 5:21 PM  Primary Physician: [name], MD  Chief Complaint:  Chief Complaint  Patient presents with  . Follow-up    HPI: 52 y.o. year old male presents with a 20 day history of nasal congestion, post nasal drip, sore throat, and cough. Mild sinus pressure. Afebrile. No chills. Nasal congestion thick and green/yellow. Cough is productive of green/yellow sputum and not associated with time of day. Ears feel full, leading to sensation of muffled hearing. Has tried OTC cold preps without success. No GI complaints.   No sick contacts, recent antibiotics, or recent travels.   No leg trauma, sedentary periods, h/o cancer, or tobacco use.  Past Medical History  Diagnosis Date  . Hypertension   . GERD (gastroesophageal reflux disease)   . Adenomatous colon polyp 10/1999  . Osteoporosis   . External hemorrhoids   . Crohn's disease of small and large intestines 1999  . Pneumonia   . Esophageal stricture   . MRSA (methicillin resistant Staphylococcus aureus) 10/2010   . Diabetes mellitus   . Hyperlipemia   . Cancer     skin  . Kidney stones   . Sleep apnea     CPAP machine  . PONV (postoperative nausea and vomiting)   . Complex partial seizure   . Migraines   . Seizures   . Cataract      Home Meds: Prior to Admission medications   Medication Sig Start Date End Date Taking? Authorizing Provider  aspirin 81 MG tablet Take 81 mg by mouth daily.   Yes Historical Provider, MD  benzonatate (TESSALON) 100 MG capsule Take 1 capsule (100 mg total) by mouth 3 (three) times daily as needed for cough. 12/20/12  Yes Wendie Agreste, MD  CALCIUM PO Take 1 tablet by mouth 3 (three) times daily.    Yes Historical Provider, MD  Cyanocobalamin (VITAMIN B 12 PO) Take 1 tablet by mouth daily.   Yes Historical Provider, MD  cyclobenzaprine (FLEXERIL) 10 MG tablet Take one qhs for neck spasm  11/16/12  Yes [name], MD  diltiazem [facility]) 240 MG 24 hr capsule Take 1 capsule (240 mg total) by mouth daily. 11/13/12  Yes [name], MD  eletriptan (RELPAX) 40 MG tablet One tablet by mouth at onset of headache. May repeat in 2 hours if headache persists or recurs. x 11/15/12  Yes [name], MD  EPINEPHrine (EPIPEN) 0.3 mg/0.3 mL DEVI Inject 0.3 mLs (0.3 mg total) into the muscle once. 02/26/12  Yes Wardell Honour, MD  esomeprazole (NEXIUM) 40 MG capsule Take 40 mg by mouth daily before breakfast.   Yes Historical Provider, MD  folic acid (FOLVITE) 1 MG tablet Take 2 tablets (2 mg total) by mouth daily. 12/22/12  Yes [name], MD  glimepiride (AMARYL) 2 MG tablet Take 1 tablet (2 mg total) by mouth daily before breakfast. 11/13/12  Yes [name], MD  glucose blood (CHOICE DM FORA G20 TEST STRIPS) test strip Use as instructed 11/13/12 11/13/13 Yes [name], MD  HYDROcodone-acetaminophen (NORCO/VICODIN) 5-325 MG per tablet Take 1 tablet by mouth every 6 (six) hours as needed for pain.   Yes Historical Provider, MD  HYDROcodone-homatropine (HYCODAN) 5-1.5 MG/5ML syrup 6mby mouth a bedtime as needed for cough. 12/20/12  Yes JWendie Agreste MD  Insulin Glargine (LANTUS SOLOSTAR) 100 UNIT/ML SOPN Inject 35 Units into the skin daily. 11/16/12  Yes KRobyn Haber  MD  Lacosamide (VIMPAT) 100 MG TABS Take 100 mg by mouth 2 (two) times daily.   Yes Historical Provider, MD  Lactase (DAIRY-RELIEF PO) Take 3 tablets by mouth 3 (three) times daily.    Yes Historical Provider, MD  levETIRAcetam (KEPPRA) 750 MG tablet Take 1,500 mg by mouth 2 (two) times daily.    Yes Historical Provider, MD  mercaptopurine (PURINETHOL) 50 MG tablet Take 100 mg by mouth 2 (two) times daily. Give on an empty stomach 1 hour before or 2 hours after meals. Caution: Chemotherapy.   Yes Historical Provider, MD  Mesalamine (ASACOL HD) 800 MG TBEC Take 2 tablets (1,600 mg total) by mouth 3 (three) times  daily. 11/09/12  Yes Ladene Artist, MD  metFORMIN (GLUCOPHAGE) 500 MG tablet Take 2 tablets (1,000 mg total) by mouth every evening. 11/13/12  Yes [name], MD  methscopolamine (PAMINE FORTE) 5 MG tablet Take 5 mg by mouth 2 (two) times daily.   Yes Historical Provider, MD  ondansetron (ZOFRAN ODT) 8 MG disintegrating tablet Take 1 tablet (8 mg total) by mouth every 8 (eight) hours as needed for nausea. 10/29/12  Yes Kathalene Frames, MD  ONE [facility] ULTRA TEST test strip USE AS INSTRUCTED 12/05/12  Yes Ryan M Dunn, PA-C  peg 3350 powder (MOVIPREP) 100 G SOLR Take 1 kit (100 g total) by mouth once. 11/09/12  Yes Ladene Artist, MD  potassium chloride SA (K-DUR,KLOR-CON) 20 MEQ tablet Take 1 tablet (20 mEq total) by mouth 2 (two) times daily. 11/13/12  Yes [name], MD  promethazine (PHENERGAN) 25 MG tablet Take 1 tablet (25 mg total) by mouth every 6 (six) hours as needed for nausea. 10/29/12  Yes Kathalene Frames, MD  saccharomyces boulardii (FLORASTOR) 250 MG capsule Take 250 mg by mouth 2 (two) times daily.   Yes Historical Provider, MD  tamsulosin (FLOMAX) 0.4 MG CAPS Take 0.4 mg by mouth as needed.    Yes Historical Provider, MD  valsartan (DIOVAN) 160 MG tablet Take 1 tablet (160 mg total) by mouth daily. 11/13/12  Yes [name], MD  cefdinir (OMNICEF) 300 MG capsule Take 1 capsule (300 mg total) by mouth 2 (two) times daily. 12/09/12   [name], MD  glucose blood (ONE TOUCH ULTRA TEST) test strip Use as instructed 09/05/11 09/04/12  Mancel Bale, PA-C  predniSONE (DELTASONE) 10 MG tablet Take 20 mg by mouth daily. Take as directed. 10/02/12   Amy S Esterwood, PA-C    Allergies:  Allergies  Allergen Reactions  . Azithromycin Swelling  . Dilaudid (Hydromorphone Hcl) Other (See Comments)    n/v  . Penicillins Hives  . Sulfonamide Derivatives Hives    History   Social History  . Marital Status: Married    Spouse Name: N/A    Number of Children: N/A  . Years of Education: N/A     Occupational History  . [employer]   Social History Main Topics  . Smoking status: Never Smoker   . Smokeless tobacco: Never Used  . Alcohol Use: No  . Drug Use: No  . Sexually Active: Yes    Birth Control/ Protection: Surgical     Comment: 1 partner in last 12 months   Other Topics Concern  . Not on file   Social History Narrative  . No narrative on file     Review of Systems: Constitutional: negative for chills, fever, night sweats or weight changes Cardiovascular: negative for chest pain  or palpitations Respiratory: negative for hemoptysis, wheezing, or shortness of breath Abdominal: negative for abdominal pain, nausea, vomiting or diarrhea Dermatological: negative for rash Neurologic: negative for headache   Physical Exam: Blood pressure 102/68, pulse 98, temperature 97.9 F (36.6 C), temperature source Oral, resp. rate 18, height 5' 10"  (1.778 m), weight 257 lb (116.574 kg), SpO2 96.00%., Body mass index is 36.88 kg/(m^2). General: Well developed, well nourished, in no acute distress. Head: Normocephalic, atraumatic, eyes without discharge, sclera non-icteric, nares are congested. Bilateral auditory canals clear, TM's are without perforation, pearly grey with reflective cone of light bilaterally. No sinus TTP. Oral cavity moist, dentition normal. Posterior pharynx with post nasal drip and mild erythema. No peritonsillar abscess or tonsillar exudate. Neck: Supple. No thyromegaly. Full ROM. No lymphadenopathy. Lungs: Coarse breath sounds bilaterally without rales or rhonchi. Breathing is unlabored.   Bilateral expiratory wheezes.  Peak flow 350 (predicted 560) Heart: RRR with S1 S2. No murmurs, rubs, or gallops appreciated. Msk:  Strength and tone normal for age. Extremities: No clubbing or cyanosis. No edema. Neuro: Alert and oriented X 3. Moves all extremities spontaneously. CNII-XII grossly in tact. Psych:  Responds to questions appropriately  with a normal affect.   Labs:   ASSESSMENT AND PLAN:  52 y.o. year old male with bronchitis. - -Mucinex -Tylenol/Motrin prn -Rest/fluids -RTC precautions -RTC 3-5 days if no improvement  Signed, [name], MD 12/23/2012 5:21 PM

## 2012-12-27 ENCOUNTER — Other Ambulatory Visit: Payer: Self-pay | Admitting: Family Medicine

## 2012-12-27 DIAGNOSIS — R059 Cough, unspecified: Secondary | ICD-10-CM

## 2012-12-27 DIAGNOSIS — R05 Cough: Secondary | ICD-10-CM

## 2012-12-27 MED ORDER — ALBUTEROL SULFATE HFA 108 (90 BASE) MCG/ACT IN AERS: 2.0000 | INHALATION_SPRAY | Freq: Four times a day (QID) | RESPIRATORY_TRACT | Status: DC | PRN

## 2012-12-31 ENCOUNTER — Encounter: Payer: Self-pay | Admitting: Nurse Practitioner

## 2012-12-31 ENCOUNTER — Ambulatory Visit (INDEPENDENT_AMBULATORY_CARE_PROVIDER_SITE_OTHER): Payer: 59 | Admitting: Nurse Practitioner

## 2012-12-31 VITALS — BP 90/60 | HR 93 | Ht 71.0 in | Wt 260.0 lb

## 2012-12-31 DIAGNOSIS — G40209 Localization-related (focal) (partial) symptomatic epilepsy and epileptic syndromes with complex partial seizures, not intractable, without status epilepticus: Secondary | ICD-10-CM

## 2012-12-31 MED ORDER — LACOSAMIDE 100 MG PO TABS: 100.0000 mg | ORAL_TABLET | Freq: Two times a day (BID) | ORAL | Status: DC

## 2012-12-31 MED ORDER — LEVETIRACETAM 750 MG PO TABS: 1500.0000 mg | ORAL_TABLET | Freq: Two times a day (BID) | ORAL | Status: DC

## 2012-12-31 NOTE — Progress Notes (Signed)
HPI: Patient returns for followup after his last visit 10/01/12.  He has a history of severe sleep apnea and is currently using CPAP. He also has a history of seizure disorder and his Keppra was increased to 1500 mg twice a day 07/28/2012 due to confusional episodes. On return visit in April  he was continuing to have confusional episodes but they were  better.  He was placed on Vimpat starter pack and is currently on 118m BID.  He is currently on prednisone for respiratory infection. He has  Completed two rounds of antibiotics. He complains of dizziness when changing positions. He has not had further seizure activity     ROS:  Fatigue, shortness of breath, cough, wheezing, dizziness, decreased energy, diarrhea and bloody stool, history of colitis  Physical Exam General: well developed, obese male  seated, in no evident distress Head: head normocephalic and atraumatic. Oropharynx benign Neck: supple with no carotid  bruits Cardiovascular: regular rate and rhythm, no murmurs  Neurologic Exam Mental Status: Awake and fully alert. Oriented to place and time. Follows all commands. Speech and language normal.   Cranial Nerves:  Pupils equal, briskly reactive to light. Extraocular movements full without nystagmus. Visual fields full to confrontation. Hearing intact and symmetric to finger snap. Facial sensation intact. Face, tongue, palate move normally and symmetrically. Neck flexion and extension normal.  Motor: Normal bulk and tone. Normal strength in all tested extremity muscles.No focal weakness Sensory.: intact to touch and pinprick and vibratory.  Coordination: Rapid alternating movements normal in all extremities. Finger-to-nose and heel-to-shin performed accurately bilaterally. Gait and Station: Arises from chair without difficulty. Stance is normal. Gait demonstrates normal stride length and balance . Able to heel, toe and tandem walk without difficulty.  Reflexes: 2+ and symmetric. Toes  downgoing.     ASSESSMENT: Confusional episodes thought to represent seizure. EEG 08/15/2011 was mildly abnormal with evidence of mild background slowing indicating mild cerebral dysfunction, no epileptiform discharges. Currently on Keppra and Vimpat with no further episodes Orthostatic hypotension noted today on exam     PLAN: Pt to continue KPoolewill refill Pt will continue Vimpat will refill Keep well hydrated, given copy of his blood pressure readings lying, seated and standing F/U in 6 months  NDennie Bible GNP-BC APRN

## 2012-12-31 NOTE — Patient Instructions (Addendum)
Pt to continue Keppra will refill Pt will continue Vimpat will refill F/U in 6 months

## 2013-01-04 ENCOUNTER — Encounter (HOSPITAL_COMMUNITY): Payer: Self-pay

## 2013-01-04 ENCOUNTER — Emergency Department (HOSPITAL_COMMUNITY)
Admission: EM | Admit: 2013-01-04 | Discharge: 2013-01-05 | Disposition: A | Discharge status: Home / Self Care | Payer: 59 | Attending: Emergency Medicine | Admitting: Emergency Medicine

## 2013-01-04 ENCOUNTER — Emergency Department (HOSPITAL_COMMUNITY): Payer: 59

## 2013-01-04 DIAGNOSIS — G40209 Localization-related (focal) (partial) symptomatic epilepsy and epileptic syndromes with complex partial seizures, not intractable, without status epilepticus: Secondary | ICD-10-CM | POA: Insufficient documentation

## 2013-01-04 DIAGNOSIS — Z7982 Long term (current) use of aspirin: Secondary | ICD-10-CM | POA: Insufficient documentation

## 2013-01-04 DIAGNOSIS — G4733 Obstructive sleep apnea (adult) (pediatric): Secondary | ICD-10-CM | POA: Insufficient documentation

## 2013-01-04 DIAGNOSIS — R51 Headache: Secondary | ICD-10-CM | POA: Insufficient documentation

## 2013-01-04 DIAGNOSIS — Z880 Allergy status to penicillin: Secondary | ICD-10-CM | POA: Insufficient documentation

## 2013-01-04 DIAGNOSIS — Z79899 Other long term (current) drug therapy: Secondary | ICD-10-CM | POA: Insufficient documentation

## 2013-01-04 DIAGNOSIS — R059 Cough, unspecified: Secondary | ICD-10-CM | POA: Insufficient documentation

## 2013-01-04 DIAGNOSIS — E875 Hyperkalemia: Secondary | ICD-10-CM | POA: Insufficient documentation

## 2013-01-04 DIAGNOSIS — R0989 Other specified symptoms and signs involving the circulatory and respiratory systems: Secondary | ICD-10-CM | POA: Insufficient documentation

## 2013-01-04 DIAGNOSIS — H538 Other visual disturbances: Secondary | ICD-10-CM | POA: Insufficient documentation

## 2013-01-04 DIAGNOSIS — Z794 Long term (current) use of insulin: Secondary | ICD-10-CM | POA: Insufficient documentation

## 2013-01-04 DIAGNOSIS — Z8679 Personal history of other diseases of the circulatory system: Secondary | ICD-10-CM | POA: Insufficient documentation

## 2013-01-04 DIAGNOSIS — E785 Hyperlipidemia, unspecified: Secondary | ICD-10-CM | POA: Insufficient documentation

## 2013-01-04 DIAGNOSIS — K219 Gastro-esophageal reflux disease without esophagitis: Secondary | ICD-10-CM | POA: Insufficient documentation

## 2013-01-04 DIAGNOSIS — J189 Pneumonia, unspecified organism: Secondary | ICD-10-CM | POA: Insufficient documentation

## 2013-01-04 DIAGNOSIS — E876 Hypokalemia: Secondary | ICD-10-CM

## 2013-01-04 DIAGNOSIS — E119 Type 2 diabetes mellitus without complications: Secondary | ICD-10-CM | POA: Insufficient documentation

## 2013-01-04 DIAGNOSIS — R05 Cough: Secondary | ICD-10-CM | POA: Insufficient documentation

## 2013-01-04 DIAGNOSIS — I10 Essential (primary) hypertension: Secondary | ICD-10-CM | POA: Insufficient documentation

## 2013-01-04 LAB — BASIC METABOLIC PANEL
BUN: 12 mg/dL (ref 6–23)
CO2: 25 mEq/L (ref 19–32)
Chloride: 99 mEq/L (ref 96–112)
Creatinine, Ser: 0.72 mg/dL (ref 0.50–1.35)
GFR calc Af Amer: >90 mL/min (ref >90)
Glucose, Bld: 122 mg/dL — ABNORMAL HIGH (ref 70–99)
Potassium: 3.1 mEq/L — ABNORMAL LOW (ref 3.5–5.1)

## 2013-01-04 LAB — CBC
HCT: 42.7 % (ref 39.0–52.0)
Hemoglobin: 14.7 g/dL (ref 13.0–17.0)
MCV: 98.4 fL (ref 78.0–100.0)
RBC: 4.34 MIL/uL (ref 4.22–5.81)
RDW: 14.8 % (ref 11.5–15.5)
WBC: 8.8 10*3/uL (ref 4.0–10.5)

## 2013-01-04 LAB — POCT I-STAT TROPONIN I

## 2013-01-04 MED ORDER — OXYCODONE-ACETAMINOPHEN 5-325 MG PO TABS
1.0000 | ORAL_TABLET | Freq: Once | ORAL | Status: AC
  Administered 2013-01-05: 1 via ORAL
  Filled 2013-01-04: qty 1

## 2013-01-04 MED ORDER — ONDANSETRON 4 MG PO TBDP
8.0000 mg | ORAL_TABLET | Freq: Once | ORAL | Status: AC
  Administered 2013-01-05: 8 mg via ORAL
  Filled 2013-01-04: qty 2

## 2013-01-04 NOTE — ED Notes (Signed)
PT C/O HEADACHE

## 2013-01-04 NOTE — ED Notes (Addendum)
Pt c/o Right side chest pain, tender to touch, unable to take a deep breath, SOB, excrutiating headache, blurred vision, chills, and nausea starting this am becoming progressively worse through out the day. Pt reports he has only had a pack of crackers today.  Pt reports his head hurts posterior neck and radiates up to his eyes

## 2013-01-05 ENCOUNTER — Emergency Department (HOSPITAL_COMMUNITY): Payer: 59

## 2013-01-05 MED ORDER — SODIUM CHLORIDE 0.9 % IV BOLUS (SEPSIS)
1000.0000 mL | Freq: Once | INTRAVENOUS | Status: AC
  Administered 2013-01-05: 1000 mL via INTRAVENOUS

## 2013-01-05 MED ORDER — LEVETIRACETAM 750 MG PO TABS
1500.0000 mg | ORAL_TABLET | Freq: Once | ORAL | Status: AC
  Administered 2013-01-05: 1500 mg via ORAL
  Filled 2013-01-05: qty 2

## 2013-01-05 MED ORDER — MORPHINE SULFATE 4 MG/ML IJ SOLN
4.0000 mg | Freq: Once | INTRAMUSCULAR | Status: AC
  Administered 2013-01-05: 4 mg via INTRAVENOUS
  Filled 2013-01-05: qty 1

## 2013-01-05 MED ORDER — POTASSIUM CHLORIDE CRYS ER 20 MEQ PO TBCR
40.0000 meq | EXTENDED_RELEASE_TABLET | Freq: Once | ORAL | Status: AC
  Administered 2013-01-05: 40 meq via ORAL
  Filled 2013-01-05: qty 2

## 2013-01-05 MED ORDER — LEVOFLOXACIN 750 MG PO TABS
750.0000 mg | ORAL_TABLET | Freq: Once | ORAL | Status: AC
  Administered 2013-01-05: 750 mg via ORAL
  Filled 2013-01-05: qty 1

## 2013-01-05 MED ORDER — LEVOFLOXACIN 750 MG PO TABS: 750.0000 mg | ORAL_TABLET | Freq: Every day | ORAL | Status: DC

## 2013-01-05 MED ORDER — METOCLOPRAMIDE HCL 5 MG/ML IJ SOLN
10.0000 mg | Freq: Once | INTRAMUSCULAR | Status: AC
  Administered 2013-01-05: 10 mg via INTRAVENOUS
  Filled 2013-01-05: qty 2

## 2013-01-05 MED ORDER — LACOSAMIDE 50 MG PO TABS
100.0000 mg | ORAL_TABLET | Freq: Two times a day (BID) | ORAL | Status: DC
  Administered 2013-01-05: 100 mg via ORAL
  Filled 2013-01-05: qty 2

## 2013-01-05 NOTE — ED Notes (Signed)
Pt dc to home. Pt sts understanding to dc instructions. Pt ambulatory to exit without difficulty.  Pt denies need for w/c.

## 2013-01-05 NOTE — ED Provider Notes (Signed)
History    CSN: [id number] Arrival date & time 01/04/13  2122  First MD Initiated Contact with Patient 01/05/13 [phone]     Chief Complaint  Patient presents with  . Chest Pain  . Headache   (Consider location/radiation/quality/duration/timing/severity/associated sxs/prior Treatment) HPI 52 yo man with multiple chronic medical problems including DM2, HTN, Crohn's diase, complex partial seizures, migraine headaches.   Presents with complaints of headache which began last approximately 14h ago as neck stiffness - as his usual migraines do. Headache pain has been constant and progressing throughout the day. Now is 10/10 and is most severe in the right retro-orbital region. Patient says he has blurred vision. Patient says he sometimes has blurred vision with his headaches but, this is worse than usual. He denies fever. No h/o trauma.   Patient has a separate complaint of right sided chest pain. He has had a minimally productive cough for a few weeks. He has severe, sharp pain just to the right of the sternum "between two ribs" with coughing but, only with coughing.   Past Medical History  Diagnosis Date  . Hypertension   . GERD (gastroesophageal reflux disease)   . Adenomatous colon polyp 10/1999  . Osteoporosis   . External hemorrhoids   . Crohn's disease of small and large intestines 1999  . Pneumonia   . Esophageal stricture   . MRSA (methicillin resistant Staphylococcus aureus) 10/2010   . Diabetes mellitus   . Hyperlipemia   . Cancer     skin  . Kidney stones   . Sleep apnea     CPAP machine  . PONV (postoperative nausea and vomiting)   . Complex partial seizure   . Migraines   . Seizures   . Cataract    Past Surgical History  Procedure Laterality Date  . Shoulder surgery    . Sinus surgery with instatrak    . Elbow surgery  2012    elbow MRSA infection   . Cataract extraction Bilateral   . Vasectomy    . Eye surgery     Family History  Problem Relation Age of  Onset  . Colon polyps Father   . Heart disease Father   . Stroke Father   . Heart disease Mother   . Colon cancer Neg Hx   . Stroke Mother   . Stroke Paternal Grandmother   . Stroke Paternal Grandfather    History  Substance Use Topics  . Smoking status: Never Smoker   . Smokeless tobacco: Never Used  . Alcohol Use: No    Review of Systems Gen: no weight loss, fevers, chills, night sweats Eyes: As per history of present illness, otherwise negative Nose: no epistaxis or rhinorrhea Mouth: no dental pain, no sore throat Neck: no neck pain Lungs: As per history of present illness, otherwise negative CV: no palpitations, dependent edema or orthopnea Abd: no abdominal pain, nausea, vomiting GU: no dysuria or gross hematuria MSK: no myalgias or arthralgias Neuro: As per history of present illness, otherwise negative Skin: no rash Psyche: negative.  Allergies  Azithromycin; Dilaudid; Penicillins; and Sulfonamide derivatives  Home Medications   Current Outpatient Rx  Name  Route  Sig  Dispense  Refill  . aspirin 81 MG tablet   Oral   Take 81 mg by mouth daily.         Marland Kitchen CALCIUM PO   Oral   Take 1 tablet by mouth 3 (three) times daily.          Marland Kitchen  Cyanocobalamin (VITAMIN B 12 PO)   Oral   Take 1 tablet by mouth as needed.          . diltiazem (TIAZAC) 240 MG 24 hr capsule   Oral   Take 1 capsule (240 mg total) by mouth daily.   90 capsule   3   . eletriptan (RELPAX) 40 MG tablet      One tablet by mouth at onset of headache. May repeat in 2 hours if headache persists or recurs. x   12 tablet   0     Needs ov before more refills are given.   . esomeprazole (NEXIUM) 40 MG capsule   Oral   Take 40 mg by mouth daily before breakfast.         . folic acid (FOLVITE) 1 MG tablet   Oral   Take 2 tablets (2 mg total) by mouth daily.   180 tablet   3   . glimepiride (AMARYL) 2 MG tablet   Oral   Take 1 tablet (2 mg total) by mouth daily before  breakfast.   90 tablet   3   . Insulin Glargine (LANTUS SOLOSTAR) 100 UNIT/ML SOPN   Subcutaneous   Inject 35 Units into the skin daily.   5 pen   PRN   . Lacosamide (VIMPAT) 100 MG TABS   Oral   Take 1 tablet (100 mg total) by mouth 2 (two) times daily.   180 tablet   1   . Lactase (DAIRY-RELIEF PO)   Oral   Take 3 tablets by mouth 3 (three) times daily.          Marland Kitchen levETIRAcetam (KEPPRA) 750 MG tablet   Oral   Take 2 tablets (1,500 mg total) by mouth 2 (two) times daily.   360 tablet   1   . mercaptopurine (PURINETHOL) 50 MG tablet   Oral   Take 100 mg by mouth 2 (two) times daily. Give on an empty stomach 1 hour before or 2 hours after meals. Caution: Chemotherapy.         . Mesalamine (ASACOL HD) 800 MG TBEC   Oral   Take 2 tablets (1,600 mg total) by mouth 3 (three) times daily.   540 tablet   3   . metFORMIN (GLUCOPHAGE) 500 MG tablet   Oral   Take 2 tablets (1,000 mg total) by mouth every evening.   180 tablet   3   . methscopolamine (PAMINE FORTE) 5 MG tablet   Oral   Take 5 mg by mouth 2 (two) times daily.         . potassium chloride SA (K-DUR,KLOR-CON) 20 MEQ tablet   Oral   Take 1 tablet (20 mEq total) by mouth 2 (two) times daily.   180 tablet   3   . saccharomyces boulardii (FLORASTOR) 250 MG capsule   Oral   Take 250 mg by mouth 2 (two) times daily.         . valsartan (DIOVAN) 160 MG tablet   Oral   Take 80 mg by mouth daily.         . B-D UF III MINI PEN NEEDLES 31G X 5 MM MISC               . cyclobenzaprine (FLEXERIL) 10 MG tablet      Take one qhs for neck spasm   10 tablet   0   . EPINEPHrine (EPIPEN) 0.3 mg/0.3 mL DEVI  Intramuscular   Inject 0.3 mLs (0.3 mg total) into the muscle once.   1 Device   1   . glucose blood (CHOICE DM FORA G20 TEST STRIPS) test strip      Use as instructed   100 each   12   . EXPIRED: glucose blood (ONE TOUCH ULTRA TEST) test strip      Use as instructed   100 each    0     Needs office visit   . HYDROcodone-acetaminophen (NORCO/VICODIN) 5-325 MG per tablet   Oral   Take 1 tablet by mouth every 6 (six) hours as needed for pain.         . ONE TOUCH ULTRA TEST test strip      USE AS INSTRUCTED   100 each   3   . ONETOUCH DELICA LANCETS 95G MISC               . tamsulosin (FLOMAX) 0.4 MG CAPS   Oral   Take 0.4 mg by mouth as needed.           BP 138/92  Pulse 97  Temp(Src) 98 F (36.7 C) (Oral)  Resp 18  Ht 5' 10"  (1.778 m)  Wt 250 lb (113.399 kg)  BMI 35.87 kg/m2  SpO2 94% Physical Exam Gen: well developed and well nourished appearing, laying in darkened room with an ice pack over his eyes Head: NCAT Eyes: PERL, EOMI, no papilledema Nose: no epistaixis or rhinorrhea Mouth/throat: mucosa is moist and pink Neck: supple, no stridor Lungs: CTA B, no wheezing, right basilar rhonchi, lungs cta left, no wheezing Chest wall - ttp intercostal space at approx the T3-T4 level CV: RRR, no murmur, ext well perfused.  Abd: soft, notender, nondistended, obese Back: no ttp, no cva ttp Skin: no rashese, wnl Neuro: CN ii-xii grossly intact, no focal deficits, motor strength 5 over 5 in both extremities, normal finger to nose, normal speech Psyche; normal affect,  calm and cooperative.   ED Course  Procedures (including critical care time)  Results for orders placed during the hospital encounter of 01/04/13 (from the past 24 hour(s))  CBC     Status: None   Collection Time    01/04/13  9:55 PM      Result Value Range   WBC 8.8  4.0 - 10.5 K/uL   RBC 4.34  4.22 - 5.81 MIL/uL   Hemoglobin 14.7  13.0 - 17.0 g/dL   HCT 42.7  39.0 - 52.0 %   MCV 98.4  78.0 - 100.0 fL   MCH 33.9  26.0 - 34.0 pg   MCHC 34.4  30.0 - 36.0 g/dL   RDW 14.8  11.5 - 15.5 %   Platelets 261  150 - 400 K/uL  BASIC METABOLIC PANEL     Status: Abnormal   Collection Time    01/04/13  9:55 PM      Result Value Range   Sodium 138  135 - 145 mEq/L   Potassium 3.1  (*) 3.5 - 5.1 mEq/L   Chloride 99  96 - 112 mEq/L   CO2 25  19 - 32 mEq/L   Glucose, Bld 122 (*) 70 - 99 mg/dL   BUN 12  6 - 23 mg/dL   Creatinine, Ser 0.72  0.50 - 1.35 mg/dL   Calcium 9.2  8.4 - 10.5 mg/dL   GFR calc non Af Amer >90  >90 mL/min   GFR calc Af Amer >90  >90  mL/min  PRO B NATRIURETIC PEPTIDE     Status: None   Collection Time    01/04/13  9:55 PM      Result Value Range   Pro B Natriuretic peptide (BNP) 19.8  0 - 125 pg/mL  POCT I-STAT TROPONIN I     Status: None   Collection Time    01/04/13 10:05 PM      Result Value Range   Troponin i, poc 0.00  0.00 - 0.08 ng/mL   Comment 3              Dg Chest 2 View  01/04/2013   *RADIOLOGY REPORT*  Clinical Data: Right chest pain.  CHEST - 2 VIEW  Comparison: 12/20/2012  Findings: Two views of the chest demonstrate slightly low lung volumes.  Heart and mediastinum are stable.  Densities in the retrocardiac space probably represent normal lung markings and some atelectasis.  Trachea is midline. Lucency along the left hemidiaphragm is nonspecific on the frontal view.  No clear evidence for free air on the lateral view.  IMPRESSION: Low lung volumes without focal disease.   Original Report Authenticated By: Markus Daft, M.D.   CT brain: NAICP  MDM  Patient with history of migraine headaches presents with headache and no focal neurologic deficits. He is feeling better after symptomatic management. Head CT is negative. I do not suspect meningitis or subarachnoid hemorrhage. Did not do lumbar puncture is indicated at this time this patient is feeling significantly improved.  Patient has a productive cough and abnormal breath sounds right base. Although his chest x-ray is unremarkable, I believe that, clinically he has an early pneumonia. Thus, we'll treat appear clear with Levaquin. The patient has a low port score and is good candidate for outpatient tx. Has close relationship with PCP. Anticipate d/c. K low and has been supplemented.    Elyn Peers, MD 01/05/13 [phone]

## 2013-01-07 ENCOUNTER — Telehealth: Payer: Self-pay | Admitting: Radiology

## 2013-01-07 ENCOUNTER — Ambulatory Visit
Admission: RE | Admit: 2013-01-07 | Discharge: 2013-01-07 | Disposition: A | Discharge status: Home / Self Care | Payer: 59 | Source: Ambulatory Visit | Attending: Family Medicine | Admitting: Family Medicine

## 2013-01-07 DIAGNOSIS — R0602 Shortness of breath: Secondary | ICD-10-CM

## 2013-01-07 MED ORDER — IOHEXOL 350 MG/ML SOLN
125.0000 mL | Freq: Once | INTRAVENOUS | Status: AC | PRN
  Administered 2013-01-07: 125 mL via INTRAVENOUS

## 2013-01-07 NOTE — Telephone Encounter (Signed)
Patients scan does not require pre certification. [name] [name] advised.

## 2013-01-07 NOTE — Telephone Encounter (Signed)
[facility] [address] can see patient now for the scan. Patient advised, but states he can not get transportation, he will call to reschedule/ to you [facility]

## 2013-01-07 NOTE — Telephone Encounter (Signed)
Dr Joseph Art called me patient needs CT scan and pulmonology consult/ orders generated.

## 2013-01-12 ENCOUNTER — Ambulatory Visit (INDEPENDENT_AMBULATORY_CARE_PROVIDER_SITE_OTHER): Payer: 59 | Admitting: Internal Medicine

## 2013-01-12 ENCOUNTER — Encounter: Payer: Self-pay | Admitting: Internal Medicine

## 2013-01-12 ENCOUNTER — Encounter: Payer: Self-pay | Admitting: *Deleted

## 2013-01-12 VITALS — BP 114/76 | HR 116 | Temp 98.2°F | Ht 70.0 in | Wt 255.2 lb

## 2013-01-12 DIAGNOSIS — R05 Cough: Secondary | ICD-10-CM

## 2013-01-12 MED ORDER — TRAMADOL HCL 50 MG PO TABS: ORAL_TABLET | ORAL | Status: DC

## 2013-01-12 MED ORDER — PREDNISONE (PAK) 10 MG PO TABS: ORAL_TABLET | ORAL | Status: DC

## 2013-01-12 MED ORDER — ESOMEPRAZOLE MAGNESIUM 40 MG PO CPDR: DELAYED_RELEASE_CAPSULE | ORAL | Status: DC

## 2013-01-12 NOTE — Patient Instructions (Addendum)
Prednisone 10 mg take  4 each am x 2 days,   2 each am x 2 days,  1 each am x 2 days and stop   Nexium 40 mg Take 30- 60 min before your first and last meals of the day   Take delsym two tsp every 12 hours and supplement if needed with  tramadol 50 mg up to 2 every 4 hours to suppress the urge to cough. Swallowing water or using ice chips/non mint and menthol containing candies (such as lifesavers or sugarless jolly ranchers) are also effective.  You should rest your voice and avoid activities that you know make you cough.  Once you have eliminated the cough for 3 straight days try reducing the tramadol first,  then the delsym as tolerated.    GERD (REFLUX)  is an extremely common cause of respiratory symptoms, many times with no significant heartburn at all.    It can be treated with medication, but also with lifestyle changes including avoidance of late meals, excessive alcohol, smoking cessation, and avoid fatty foods, chocolate, peppermint, colas, red wine, and acidic juices such as orange juice.  NO MINT OR MENTHOL PRODUCTS SO NO COUGH DROPS  USE SUGARLESS CANDY INSTEAD (jolley ranchers or Stover's)  NO OIL BASED VITAMINS - use powdered substitutes.    Please schedule a follow up office visit in 2  weeks, sooner if needed

## 2013-01-12 NOTE — Progress Notes (Signed)
  Subjective:    Patient ID: [name] [name], male    DOB: September 01, 1960  MRN: [id number]  HPI  2  yowm works IT for [employer]  Never smoker with lots passive smoke exp "always had a cold" with lots of sinus infections and better p moved out on his own but dx as Chrohn's in 20's with recurrent sinus and bronchial infections in his late 77's and fine in between but needed sinus surgery around [address] and referred by Dr [name] [name] 01/12/2013 to pulmonary clinic for refractory cough.  Of note previous extensive evaluation by [facility] in 2010 showed no bronchiectasis and complete resolution of chronic cough with rx of uacs > d/c acei, rx gerd.   01/12/2013 1st pulmonary eval in [facility] era /[name] [name]  cc abrupt onset cough x 5 weeks p onset "cold caught from daughter" with severe cough then R ant cp but cough min yellowish mucus worse when moving around rx prednisone and abx x 2 including levaquin, not convinced got better but then def  Worse when stopped prednisone.  Breathing and cp both come on with coughing and better when stops.  No obvious daytime variabilty or assoc  cp or chest tightness, subjective wheeze overt sinus or hb symptoms. No unusual exp hx or h/o childhood pna/ asthma or knowledge of premature birth.   Sleeping ok without nocturnal  or early am exacerbation  of respiratory  c/o's or need for noct saba. Also denies any obvious fluctuation of symptoms with weather or environmental changes or other aggravating or alleviating factors except as outlined above   Review of Systems  Constitutional: Negative for fever, chills, activity change, appetite change and unexpected weight change.  HENT: Positive for sore throat. Negative for congestion, rhinorrhea, sneezing, trouble swallowing, dental problem, voice change and postnasal drip.   Eyes: Negative for visual disturbance.  Respiratory: Positive for cough and shortness of breath. Negative for choking.   Cardiovascular: Positive for chest pain.  Negative for leg swelling.  Gastrointestinal: Positive for abdominal pain. Negative for nausea and vomiting.  Genitourinary: Negative for difficulty urinating.  Musculoskeletal: Positive for arthralgias.  Skin: Negative for rash.  Psychiatric/Behavioral: Negative for behavioral problems and confusion.       Objective:   Physical Exam  amb slt cushingnoid  Pot bellied nad Wt Readings from Last 3 Encounters:  01/12/13 255 lb 3.2 oz (115.758 kg)  01/04/13 250 lb (113.399 kg)  12/31/12 260 lb (117.935 kg)     HEENT: nl dentition, turbinates, and orophanx. Nl external ear canals without cough reflex   NECK :  without JVD/Nodes/TM/ nl carotid upstrokes bilaterally   LUNGS: no acc muscle use, clear to A and P bilaterally with a few insp rhonchi bilaterally    CV:  RRR  no s3 or murmur or increase in P2, no edema   ABD:  soft and nontender with nl excursion in the supine position. No bruits or organomegaly, bowel sounds nl  MS:  warm without deformities, calf tenderness, cyanosis or clubbing  SKIN: warm and dry without lesions    NEURO:  alert, approp, no deficits    Ct Chest 01/07/13  No acute chest abnormality. No evidence for a pulmonary embolism.       Assessment & Plan:

## 2013-01-13 NOTE — Assessment & Plan Note (Addendum)
The most common causes of chronic cough in immunocompetent adults include the following: upper airway cough syndrome (UACS), previously referred to as postnasal drip syndrome (PNDS), which is caused by variety of rhinosinus conditions; (2) asthma; (3) GERD; (4) chronic bronchitis from cigarette smoking or other inhaled environmental irritants; (5) nonasthmatic eosinophilic bronchitis; and (6) bronchiectasis.   These conditions, singly or in combination, have accounted for up to 94% of the causes of chronic cough in prospective studies.   Other conditions have constituted no >6% of the causes in prospective studies These have included bronchogenic carcinoma, chronic interstitial pneumonia, sarcoidosis, left ventricular failure, ACEI-induced cough, and aspiration from a condition associated with pharyngeal dysfunction.    Chronic cough is often simultaneously caused by more than one condition. A single cause has been found from 38 to 82% of the time, multiple causes from 18 to 62%. Multiply caused cough has been the result of three diseases up to 42% of the time.       Strongly suspect this is again an example of  Classic Upper airway cough syndrome, so named because it's frequently impossible to sort out how much is  CR/sinusitis with freq throat clearing (which can be related to primary GERD)   vs  causing  secondary (" extra esophageal")  GERD from wide swings in gastric pressure that occur with throat clearing, often  promoting self use of mint and menthol lozenges that reduce the lower esophageal sphincter tone and exacerbate the problem further in a cyclical fashion.   These are the same pts (now being labeled as having "irritable larynx syndrome" by some cough centers) who not infrequently have a history of having failed to tolerate ace inhibitors,  dry powder inhalers or biphosphonates or report having atypical reflux symptoms that don't respond to standard doses of PPI , and are easily confused as  having aecopd or asthma flares by even experienced allergists/ pulmonologists and probably are the cause of both his cp and his sob at this point.   For now max gerd rx and stop cyclical coughing and next step is sinus ct if doesn't improve

## 2013-01-15 ENCOUNTER — Encounter: Payer: Self-pay | Admitting: Gastroenterology

## 2013-01-18 ENCOUNTER — Other Ambulatory Visit: Payer: Self-pay | Admitting: Family Medicine

## 2013-01-19 ENCOUNTER — Ambulatory Visit (INDEPENDENT_AMBULATORY_CARE_PROVIDER_SITE_OTHER): Payer: 59 | Admitting: Family Medicine

## 2013-01-19 VITALS — BP 142/88 | HR 92 | Temp 98.1°F | Resp 18 | Ht 70.5 in | Wt 252.4 lb

## 2013-01-19 DIAGNOSIS — N498 Inflammatory disorders of other specified male genital organs: Secondary | ICD-10-CM

## 2013-01-19 DIAGNOSIS — L723 Sebaceous cyst: Secondary | ICD-10-CM

## 2013-01-19 DIAGNOSIS — N492 Inflammatory disorders of scrotum: Secondary | ICD-10-CM

## 2013-01-19 DIAGNOSIS — L089 Local infection of the skin and subcutaneous tissue, unspecified: Secondary | ICD-10-CM

## 2013-01-19 MED ORDER — DOXYCYCLINE HYCLATE 100 MG PO CAPS: 100.0000 mg | ORAL_CAPSULE | Freq: Two times a day (BID) | ORAL | Status: DC

## 2013-01-19 NOTE — Progress Notes (Signed)
Subjective:    Patient ID: [name] [name], male    DOB: [birth year], 52 y.o.   MRN: [id number]   Chief Complaint  Patient presents with  . Recurrent Skin Infections    Just started today on right scrotum - hx of boils   HPI  Has recurrent boils and abscesses as immunosuppressed w/ crohn's and DM.  Had to have surgery on his left elbow for MRSA - in the hosp for sev d sev yrs ago.  Had a small pimple in a different location on his scrotum last week.  This morning he first noticed a little lesion in his right upper scrotum - rubbing against his right thigh - about the size of a thumbnail.  Has steadily increased in size and pain all day - now it feels like he has a 3rd testicle and can hardly walk due to pain.  Has prn pain med for chronic pain at home.  Past Medical History  Diagnosis Date  . Hypertension   . GERD (gastroesophageal reflux disease)   . Adenomatous colon polyp 10/1999  . Osteoporosis   . External hemorrhoids   . Crohn's disease of small and large intestines 1999  . Pneumonia   . Esophageal stricture   . MRSA (methicillin resistant Staphylococcus aureus) 10/2010   . Diabetes mellitus   . Hyperlipemia   . Cancer     skin  . Kidney stones   . Sleep apnea     CPAP machine  . PONV (postoperative nausea and vomiting)   . Complex partial seizure   . Migraines   . Seizures   . Cataract    Current Outpatient Prescriptions on File Prior to Visit  Medication Sig Dispense Refill  . aspirin 81 MG tablet Take 81 mg by mouth daily.      Marland Kitchen CALCIUM PO Take 1 tablet by mouth 3 (three) times daily.       . Cyanocobalamin (VITAMIN B 12 PO) Take 1 tablet by mouth as needed.       . cyclobenzaprine (FLEXERIL) 10 MG tablet As directed as needed      . diltiazem (TIAZAC) 240 MG 24 hr capsule Take 1 capsule (240 mg total) by mouth daily.  90 capsule  3  . eletriptan (RELPAX) 40 MG tablet Take 1 tablet at onset of headache, may repeat in 2 hours if headache persists or reoccurs.  12  tablet  4  . EPINEPHrine (EPIPEN) 0.3 mg/0.3 mL DEVI Inject 0.3 mLs (0.3 mg total) into the muscle once.  1 Device  1  . folic acid (FOLVITE) 1 MG tablet Take 2 tablets (2 mg total) by mouth daily.  180 tablet  3  . glimepiride (AMARYL) 2 MG tablet Take 1 tablet (2 mg total) by mouth daily before breakfast.  90 tablet  3  . glucose blood (CHOICE DM FORA G20 TEST STRIPS) test strip Use as instructed  100 each  12  . HYDROcodone-acetaminophen (NORCO/VICODIN) 5-325 MG per tablet Take 1 tablet by mouth every 6 (six) hours as needed for pain.      . Insulin Glargine (LANTUS SOLOSTAR) 100 UNIT/ML SOPN Inject 35 Units into the skin daily.  5 pen  PRN  . Lacosamide (VIMPAT) 100 MG TABS Take 1 tablet (100 mg total) by mouth 2 (two) times daily.  180 tablet  1  . Lactase (DAIRY-RELIEF PO) Take 3 tablets by mouth 3 (three) times daily.       Marland Kitchen levETIRAcetam (KEPPRA) 750 MG tablet  Take 2 tablets (1,500 mg total) by mouth 2 (two) times daily.  360 tablet  1  . mercaptopurine (PURINETHOL) 50 MG tablet Take 100 mg by mouth 2 (two) times daily. Give on an empty stomach 1 hour before or 2 hours after meals. Caution: Chemotherapy.      . Mesalamine (ASACOL HD) 800 MG TBEC Take 2 tablets (1,600 mg total) by mouth 3 (three) times daily.  540 tablet  3  . metFORMIN (GLUCOPHAGE) 500 MG tablet Take 2 tablets (1,000 mg total) by mouth every evening.  180 tablet  3  . methscopolamine (PAMINE FORTE) 5 MG tablet Take 5 mg by mouth 2 (two) times daily.      Glory Rosebush DELICA LANCETS 37S MISC       . potassium chloride SA (K-DUR,KLOR-CON) 20 MEQ tablet Take 1 tablet (20 mEq total) by mouth 2 (two) times daily.  180 tablet  3  . saccharomyces boulardii (FLORASTOR) 250 MG capsule Take 250 mg by mouth 2 (two) times daily.      . tamsulosin (FLOMAX) 0.4 MG CAPS Take 0.4 mg by mouth as needed.       . traMADol (ULTRAM) 50 MG tablet 1-2 every 4 hours as needed for cough or pain  40 tablet  0  . valsartan (DIOVAN) 160 MG tablet Take  80 mg by mouth daily.      . B-D UF III MINI PEN NEEDLES 31G X 5 MM MISC        No current facility-administered medications on file prior to visit.   Allergies  Allergen Reactions  . Azithromycin Swelling  . Dilaudid [Hydromorphone Hcl] Other (See Comments)    n/v  . Penicillins Hives  . Sulfonamide Derivatives Hives     Review of Systems  Constitutional: Negative for fever, chills, activity change and appetite change.  Cardiovascular: Negative for leg swelling.  Gastrointestinal: Negative for nausea, vomiting, abdominal pain, diarrhea and constipation.  Musculoskeletal: Positive for myalgias. Negative for joint swelling and gait problem.  Skin: Positive for rash and wound.  Neurological: Negative for weakness and numbness.  Hematological: Negative for adenopathy. Does not bruise/bleed easily.      BP 142/88  Pulse 92  Temp(Src) 98.1 F (36.7 C) (Oral)  Resp 18  Ht 5' 10.5" (1.791 m)  Wt 252 lb 6.4 oz (114.488 kg)  BMI 35.69 kg/m2 Objective:   Physical Exam  Constitutional: He is oriented to person, place, and time. He appears well-developed and well-nourished. No distress.  HENT:  Head: Normocephalic and atraumatic.  Eyes: No scleral icterus.  Pulmonary/Chest: Effort normal.  Neurological: He is alert and oriented to person, place, and time.  Skin: Skin is warm and dry. He is not diaphoretic.  On right lateral upper scrotum erythematous indurated warmth macule with central fluctuance.  Psychiatric: He has a normal mood and affect. His behavior is normal.          Assessment & Plan:  Abscess, scrotum - Plan: Wound culture  Infected sebaceous cyst  Meds ordered this encounter  Medications  . DISCONTD: doxycycline (VIBRAMYCIN) 100 MG capsule    Sig: Take 1 capsule (100 mg total) by mouth 2 (two) times daily.    Dispense:  20 capsule    Refill:  0  . doxycycline (VIBRAMYCIN) 100 MG capsule    Sig: Take 1 capsule (100 mg total) by mouth 2 (two) times daily.     Dispense:  20 capsule    Refill:  0

## 2013-01-19 NOTE — Progress Notes (Signed)
I directly supervised and participated in the procedure and agree with the student's documentation.

## 2013-01-19 NOTE — Progress Notes (Signed)
Procedure: Consent obtained from the patient. Right scrotal abscess cleaned with alcohol. Area anesthetized with 2cc of 2% lidocaine. Area cleaned with betadine. 11 blade used to incise. Sebaceous material removed and area flushed with remaining 3 cc of 2% lidocaine. Approximately 4 cm of 1/4 inch packing placed in wound. Area covered with dressing. Discussed wound care with patient.

## 2013-01-19 NOTE — Patient Instructions (Signed)
Start antibiotic Doxycycline tonight Apply warm compresses to the area You may shower and then replace the dressing You may change dressing if there is leakage or if it falls off Please return Thursday for wound care or Wednesday if pain increasing

## 2013-01-21 ENCOUNTER — Ambulatory Visit (INDEPENDENT_AMBULATORY_CARE_PROVIDER_SITE_OTHER): Payer: 59 | Admitting: Physician Assistant

## 2013-01-21 VITALS — BP 118/60 | HR 100 | Temp 98.0°F | Resp 16 | Ht 70.0 in | Wt 245.6 lb

## 2013-01-21 DIAGNOSIS — N492 Inflammatory disorders of scrotum: Secondary | ICD-10-CM

## 2013-01-21 DIAGNOSIS — N498 Inflammatory disorders of other specified male genital organs: Secondary | ICD-10-CM

## 2013-01-21 NOTE — Progress Notes (Signed)
  Subjective:    Patient ID: [name] [name], male    DOB: 09/27/1960, 52 y.o.   MRN: [id number]  HPI   [name] [name] is a very pleasant 52 yr old male here for wound care s/p I&D of a scrotal abscess.  He reports that he is improving, though he still has some significant pain in the area.  He continues to note drainage from the wound.  Packing is in place.  He is changing the dressing.  Tolerating doxy well and taking as directed.     Review of Systems  Constitutional: Negative.   HENT: Negative.   Respiratory: Negative.   Cardiovascular: Negative.   Gastrointestinal: Negative.   Musculoskeletal: Negative.   Skin: Positive for wound.  Neurological: Negative.        Objective:   Physical Exam  Vitals reviewed. Constitutional: He is oriented to person, place, and time. He appears well-developed and well-nourished. No distress.  HENT:  Head: Normocephalic and atraumatic.  Eyes: Conjunctivae are normal. No scleral icterus.  Pulmonary/Chest: Effort normal.  Genitourinary:     Right sided scrotal abscess; no surrounding erythema or induration; wound bed healthy; no drainage  Neurological: He is alert and oriented to person, place, and time.  Skin: Skin is warm and dry.  Psychiatric: He has a normal mood and affect. His behavior is normal.    Wound Care: Dressing and packing removed.  Packing saturated with serosanguinous material.  Unable to express any drainage from the wound.  Irrigated with 5cc 2% plain lidocaine.  Repacked with 1/4" plain packing.  Cleansed and dressed.        Assessment & Plan:  Scrotal abscess   [name] [name] is a very pleasant 52 yr old male here for wound care.  Abscess appears to be healing well, though he still has significant tenderness.  I was unable to express any further drainage from the wound.  Wound care per above.  Cx still pending.  Continue doxy and daily dressing changes.  Will have pt follow up 01/24/13 - fast track card updated.

## 2013-01-22 LAB — WOUND CULTURE: Organism ID, Bacteria: NO GROWTH

## 2013-01-24 ENCOUNTER — Ambulatory Visit (INDEPENDENT_AMBULATORY_CARE_PROVIDER_SITE_OTHER): Payer: 59 | Admitting: Physician Assistant

## 2013-01-24 VITALS — BP 92/54 | HR 97 | Temp 97.9°F | Resp 20 | Ht 71.0 in | Wt 249.0 lb

## 2013-01-24 DIAGNOSIS — N498 Inflammatory disorders of other specified male genital organs: Secondary | ICD-10-CM

## 2013-01-24 DIAGNOSIS — N492 Inflammatory disorders of scrotum: Secondary | ICD-10-CM

## 2013-01-24 NOTE — Patient Instructions (Addendum)
Continue taking antibiotic Doxycycline Continue using warm compresses Return in 2 days for a recheck

## 2013-01-24 NOTE — Progress Notes (Signed)
  Subjective:    Patient ID: [name] [name], male    DOB: [birth year], 52 y.o.   MRN: [id number]  HPI 52 y.o. male here for wound care s/p I&D of a scrotal abscess 01/19/13. He reports that he is improving, though he still has some significant tenderness in the area. He continues to note drainage from the wound. Packing is in place. He is changing the dressing daily. Tolerating antibiotics well and taking as directed.    Review of Systems  Skin: Positive for wound.  All other systems reviewed and are negative.      Objective:   Physical Exam  Nursing note and vitals reviewed. Constitutional: He appears well-developed and well-nourished. No distress.  HENT:  Head: Normocephalic and atraumatic.  Right Ear: External ear normal.  Left Ear: External ear normal.  Eyes: Conjunctivae are normal.  Neck: Normal range of motion. Neck supple.  Neurological: He is alert.  Skin: Skin is warm and dry. He is not diaphoretic. No pallor.  Psychiatric: He has a normal mood and affect. His speech is normal and behavior is normal. Judgment and thought content normal. Cognition and memory are normal.  Genitourinary:     Right sided scrotal abscess; no surrounding erythema or induration; wound bed healthy; serosanguinous drainage on packing but no active drainage. Small superficial pustule located distal to large abscess. No surrounding erythema.     Wound care: Dressing and packing removed. Packing saturated with serosanguinous material. Unable to express any drainage from the wound. Irrigated with 5cc 2% plain lidocaine.Small superficial pustule opened and pus removed. Wound repacked with 1/4" plain packing. Cleansed and dressed.   Results for orders placed in visit on 01/19/13  WOUND CULTURE      Result Value Range   Gram Stain Rare     Gram Stain WBC present-both PMN and Mononuclear     Gram Stain No Squamous Epithelial Cells Seen     Gram Stain Rare GRAM POSITIVE COCCI IN CLUSTERS     Organism  ID, Bacteria NO GROWTH 2 DAYS      Assessment & Plan:  Scrotal abscess to be healing well, still very tender. No further drainage expressed from the wound. Wound care per above. Culture showed no bacteria. Patient to continue taking Doxycycline, continue daily dressing changes, and use warm compresses. Pt to follow up in 2 days. Fast track card updated.

## 2013-01-24 NOTE — Progress Notes (Signed)
I directly supervised and participated in the procedure and agree with the student's documentation.

## 2013-01-26 ENCOUNTER — Ambulatory Visit (INDEPENDENT_AMBULATORY_CARE_PROVIDER_SITE_OTHER): Payer: 59 | Admitting: Internal Medicine

## 2013-01-26 ENCOUNTER — Encounter: Payer: Self-pay | Admitting: Internal Medicine

## 2013-01-26 ENCOUNTER — Ambulatory Visit (INDEPENDENT_AMBULATORY_CARE_PROVIDER_SITE_OTHER): Payer: 59 | Admitting: Physician Assistant

## 2013-01-26 VITALS — BP 126/82 | Temp 98.0°F

## 2013-01-26 VITALS — BP 102/66 | HR 97 | Temp 97.7°F | Ht 71.0 in | Wt 255.8 lb

## 2013-01-26 DIAGNOSIS — N492 Inflammatory disorders of scrotum: Secondary | ICD-10-CM

## 2013-01-26 DIAGNOSIS — R05 Cough: Secondary | ICD-10-CM

## 2013-01-26 DIAGNOSIS — N498 Inflammatory disorders of other specified male genital organs: Secondary | ICD-10-CM

## 2013-01-26 MED ORDER — ESOMEPRAZOLE MAGNESIUM 40 MG PO CPDR: DELAYED_RELEASE_CAPSULE | ORAL | Status: DC

## 2013-01-26 NOTE — Patient Instructions (Addendum)
Ok to resume Nexium Take 30-60 min before first meal of the day and either add pepcid at bedtime or nexium before supper at the first sign of respiratory flare    If you are satisfied with your treatment plan let your doctor know and he/she can either refill your medications or you can return here when your prescription runs out.     If in any way you are not 100% satisfied,  please tell us.  If 100% better, tell your friends!

## 2013-01-26 NOTE — Progress Notes (Signed)
Patient ID: [name] [name] MRN: [id number], DOB: 12-04-1960 52 y.o. Date of Encounter: 01/26/2013, 7:35 PM  Chief Complaint: Wound care   See previous note  HPI: 52 y.o. y/o male presents for wound care s/p I&D on 01/19/13.  Doing well No issues or complaints Afebrile/ no chills No nausea or vomiting Tolerating doxycycline.  Pain resolved. Has noted 2 new lesions that have become erythematous and bothersome and is concerned about them.  No spontaneous drainage from the new lesions.   Daily dressing change Previous note reviewed  Past Medical History  Diagnosis Date  . Hypertension   . GERD (gastroesophageal reflux disease)   . Adenomatous colon polyp 10/1999  . Osteoporosis   . External hemorrhoids   . Crohn's disease of small and large intestines 1999  . Pneumonia   . Esophageal stricture   . MRSA (methicillin resistant Staphylococcus aureus) 10/2010   . Diabetes mellitus   . Hyperlipemia   . Cancer     skin  . Kidney stones   . Sleep apnea     CPAP machine  . PONV (postoperative nausea and vomiting)   . Complex partial seizure   . Migraines   . Seizures   . Cataract      Home Meds: Prior to Admission medications   Medication Sig Start Date End Date Taking? Authorizing Provider  aspirin 81 MG tablet Take 81 mg by mouth daily.   Yes Historical Provider, MD  B-D UF III MINI PEN NEEDLES 31G X 5 MM MISC  10/10/12  Yes Historical Provider, MD  CALCIUM PO Take 1 tablet by mouth 3 (three) times daily.    Yes Historical Provider, MD  Cyanocobalamin (VITAMIN B 12 PO) Take 1 tablet by mouth as needed.    Yes Historical Provider, MD  cyclobenzaprine (FLEXERIL) 10 MG tablet As directed as needed 11/16/12  Yes [name], MD  diltiazem [facility]) 240 MG 24 hr capsule Take 1 capsule (240 mg total) by mouth daily. 11/13/12  Yes [name], MD  doxycycline (VIBRAMYCIN) 100 MG capsule Take 1 capsule (100 mg total) by mouth 2 (two) times daily. 01/19/13  Yes Shawnee Knapp, MD   eletriptan (RELPAX) 40 MG tablet Take 1 tablet at onset of headache, may repeat in 2 hours if headache persists or reoccurs. 01/18/13  Yes Wendie Agreste, MD  EPINEPHrine (EPIPEN) 0.3 mg/0.3 mL DEVI Inject 0.3 mLs (0.3 mg total) into the muscle once. 02/26/12  Yes Wardell Honour, MD  esomeprazole (NEXIUM) 40 MG capsule Take 30- 60 min before your first and last meals of the day 01/26/13  Yes Tanda Rockers, MD  folic acid (FOLVITE) 1 MG tablet Take 2 tablets (2 mg total) by mouth daily. 12/22/12  Yes [name], MD  glimepiride (AMARYL) 2 MG tablet Take 1 tablet (2 mg total) by mouth daily before breakfast. 11/13/12  Yes [name], MD  glucose blood (CHOICE DM FORA G20 TEST STRIPS) test strip Use as instructed 11/13/12 11/13/13 Yes [name], MD  HYDROcodone-acetaminophen (NORCO/VICODIN) 5-325 MG per tablet Take 1 tablet by mouth every 6 (six) hours as needed for pain.   Yes Historical Provider, MD  Insulin Glargine (LANTUS SOLOSTAR) 100 UNIT/ML SOPN Inject 35 Units into the skin daily. 11/16/12  Yes [name], MD  Lacosamide (VIMPAT) 100 MG TABS Take 1 tablet (100 mg total) by mouth 2 (two) times daily. 12/31/12  Yes Dennie Bible, NP  Lactase (DAIRY-RELIEF PO) Take 3 tablets by mouth 3 (three)  times daily.    Yes Historical Provider, MD  levETIRAcetam (KEPPRA) 750 MG tablet Take 2 tablets (1,500 mg total) by mouth 2 (two) times daily. 12/31/12  Yes Dennie Bible, NP  mercaptopurine (PURINETHOL) 50 MG tablet Take 100 mg by mouth 2 (two) times daily. Give on an empty stomach 1 hour before or 2 hours after meals. Caution: Chemotherapy.   Yes Historical Provider, MD  Mesalamine (ASACOL HD) 800 MG TBEC Take 2 tablets (1,600 mg total) by mouth 3 (three) times daily. 11/09/12  Yes Ladene Artist, MD  metFORMIN (GLUCOPHAGE) 500 MG tablet Take 2 tablets (1,000 mg total) by mouth every evening. 11/13/12  Yes [name], MD  methscopolamine (PAMINE FORTE) 5 MG tablet Take 5 mg by  mouth 2 (two) times daily.   Yes Historical Provider, MD  Jonetta Speak LANCETS 74B [place]  09/30/12  Yes Historical Provider, MD  potassium chloride SA (K-DUR,KLOR-CON) 20 MEQ tablet Take 1 tablet (20 mEq total) by mouth 2 (two) times daily. 11/13/12  Yes [name], MD  saccharomyces boulardii (FLORASTOR) 250 MG capsule Take 250 mg by mouth 2 (two) times daily.   Yes Historical Provider, MD  tamsulosin (FLOMAX) 0.4 MG CAPS Take 0.4 mg by mouth as needed.    Yes Historical Provider, MD  traMADol (ULTRAM) 50 MG tablet 1-2 every 4 hours as needed for cough or pain 01/12/13  Yes Tanda Rockers, MD  valsartan (DIOVAN) 160 MG tablet Take 80 mg by mouth daily.   Yes Historical Provider, MD    Allergies:  Allergies  Allergen Reactions  . Azithromycin Swelling  . Dilaudid (Hydromorphone Hcl) Other (See Comments)    n/v  . Penicillins Hives  . Sulfonamide Derivatives Hives    ROS: Constitutional: Afebrile, no chills Cardiovascular: negative for chest pain or palpitations Dermatological: Positive for wound. Negative for erythema, pain, or warmth.  GI: No nausea or vomiting   EXAM: Physical Exam: Blood pressure 126/82, temperature 98 F (36.7 C), temperature source Oral., There is no weight on file to calculate BMI. General: Well developed, well nourished, in no acute distress. Nontoxic appearing. Head: Normocephalic, atraumatic, sclera non-icteric.  Neck: Supple. Lungs: Breathing is unlabored. Heart: Normal rate. Skin:  Warm and moist. Dressing and packing in place. No induration, erythema, or tenderness to palpation. Medial to incision there is a small erythematous papule. No induration or fluctuance. There is a possible sebaceous cyst on scrotal sac.  No fluctuance. + induration.  Neuro: Alert and oriented X 3. Moves all extremities spontaneously. Normal gait.  Psych:  Responds to questions appropriately with a normal affect.       PROCEDURE: Dressing and packing removed. No  purulence expressed Wound bed healthy Irrigated with 1% plain lidocaine 5 cc. Repacked with 1/4 plain packing.  Dressing applied  LAB: Culture: no growth.   A/P: 52 y.o. y/o male with scrotal cellulitis/abscess as above s/p I&D on 01/19/13.  Wound care per above Continue doxycycline.  Pain well controlled Daily dressing changes Recheck if needed. Pull packing out in 2 days.   Watchful waiting of sebaceous cyst - if worsening RTC for possible I&D or consider derm referral.   Signed, Georgiann Mccoy, PA-C 01/26/2013 7:35 PM

## 2013-01-26 NOTE — Assessment & Plan Note (Signed)
In retrospect this is certainly  Classic Upper airway cough syndrome, so named because it's frequently impossible to sort out how much is  CR/sinusitis with freq throat clearing (which can be related to primary GERD)   vs  causing  secondary (" extra esophageal")  GERD from wide swings in gastric pressure that occur with throat clearing, often  promoting self use of mint and menthol lozenges that reduce the lower esophageal sphincter tone and exacerbate the problem further in a cyclical fashion.   These are the same pts (now being labeled as having "irritable larynx syndrome" by some cough centers) who not infrequently have a history of having failed to tolerate ace inhibitors(which is the case here)   dry powder inhalers or biphosphonates or report having atypical reflux symptoms that don't respond to standard doses of PPI(which is also the case here) , and are easily confused as having aecopd or asthma flares by even experienced allergists/ pulmonologists.   Explained natural history of uri and why it's necessary in patients at risk to treat GERD aggressively  at least  short term   to reduce risk of evolving cyclical cough initially  triggered by epithelial injury and a heightened sensitivty to the effects of any upper airway irritants,  most importantly acid - related.  That is, the more sensitive the epithelium damaged for virus, the more the cough, the more the secondary reflux (especially in those prone to reflux) the more the irritation of the sensitive mucosa and so on in a cyclical pattern.   Reviewed future rx in [facility]  See instructions for specific recommendations which were reviewed directly with the patient who was given a copy with highlighter outlining the key components.

## 2013-01-26 NOTE — Progress Notes (Signed)
Subjective:    Patient ID: [name] [name], male    DOB: 1961-01-25  MRN: [id number]  HPI  68  yowm works IT for [employer]  Never smoker with lots passive smoke exp "always had a cold" with lots of sinus infections and better p moved out on his own but dx as Chrohn's in 20's with recurrent sinus and bronchial infections in his late 2's and fine in between but needed sinus surgery around [phone] and referred by Dr Joseph Art 01/12/2013 to pulmonary clinic for refractory cough.  Of note previous extensive evaluation by [facility] in 2010 showed no bronchiectasis and complete resolution of chronic cough with rx of uacs > d/c acei, rx gerd.   01/12/2013 1st pulmonary eval in [facility] /Peniel Biel  cc abrupt onset cough x 5 weeks p onset "cold caught from daughter" with severe cough then R ant cp but cough min yellowish mucus worse when moving around rx prednisone and abx x 2 including levaquin, not convinced got better but then def  Worse when stopped prednisone.  Breathing and cp both come on with coughing and better when stops. rec Max ppi, pred x 6 d, tramadol  01/26/2013 f/u ov/Aleisha Paone re recurrent cough  Chief Complaint  Patient presents with  . Follow-up    Cough has improved. No complaints. Pt would like to know if he can decrease Nexium back down to 1 qd? If so, pt needs  new rx.    no longer even using delsym for cough.   No obvious daytime variabilty or assoc sob or   cp or chest tightness, subjective wheeze overt sinus or hb symptoms. No unusual exp hx or h/o childhood pna/ asthma or knowledge of premature birth.   Sleeping ok without nocturnal  or early am exacerbation  of respiratory  c/o's or need for noct saba. Also denies any obvious fluctuation of symptoms with weather or environmental changes or other aggravating or alleviating factors except as outlined above   Current Medications, Allergies, Past Medical History, Past Surgical History, Family History, and Social History were reviewed in  [employer] record.  ROS  The following are not active complaints unless bolded sore throat, dysphagia, dental problems, itching, sneezing,  nasal congestion or excess/ purulent secretions, ear ache,   fever, chills, sweats, unintended wt loss, pleuritic or exertional cp, hemoptysis,  orthopnea pnd or leg swelling, presyncope, palpitations, heartburn, abdominal pain, anorexia, nausea, vomiting, diarrhea  or change in bowel or urinary habits, change in stools or urine, dysuria,hematuria,  rash, arthralgias, visual complaints, headache, numbness weakness or ataxia or problems with walking or coordination,  change in mood/affect or memory.             Objective:   Physical Exam  amb slt cushingnoid  Pot bellied nad 01/26/2013         255  Wt Readings from Last 3 Encounters:  01/12/13 255 lb 3.2 oz (115.758 kg)  01/04/13 250 lb (113.399 kg)  12/31/12 260 lb (117.935 kg)     HEENT: nl dentition, turbinates, and orophanx. Nl external ear canals without cough reflex   NECK :  without JVD/Nodes/TM/ nl carotid upstrokes bilaterally   LUNGS: no acc muscle use, clear to A and P bilaterally with a few insp rhonchi bilaterally    CV:  RRR  no s3 or murmur or increase in P2, no edema   ABD:  soft and nontender with nl excursion in the supine position. No bruits or organomegaly, bowel sounds  nl  MS:  warm without deformities, calf tenderness, cyanosis or clubbing  SKIN: warm and dry without lesions    NEURO:  alert, approp, no deficits    Ct Chest 01/07/13  No acute chest abnormality. No evidence for a pulmonary embolism.       Assessment & Plan:

## 2013-01-29 ENCOUNTER — Telehealth: Payer: Self-pay | Admitting: Internal Medicine

## 2013-01-29 MED ORDER — ESOMEPRAZOLE MAGNESIUM 40 MG PO CPDR: DELAYED_RELEASE_CAPSULE | ORAL | Status: DC

## 2013-01-29 NOTE — Telephone Encounter (Signed)
Rx has been sent to [facility]. Pt is aware.

## 2013-02-04 ENCOUNTER — Other Ambulatory Visit: Payer: Self-pay | Admitting: Family Medicine

## 2013-02-05 ENCOUNTER — Other Ambulatory Visit: Payer: Self-pay

## 2013-02-05 MED ORDER — INSULIN PEN NEEDLE 31G X 5 MM MISC: Status: DC

## 2013-02-14 ENCOUNTER — Ambulatory Visit (HOSPITAL_COMMUNITY)
Admission: RE | Admit: 2013-02-14 | Discharge: 2013-02-14 | Disposition: A | Discharge status: Home / Self Care | Payer: 59 | Source: Ambulatory Visit | Attending: Emergency Medicine | Admitting: Emergency Medicine

## 2013-02-14 ENCOUNTER — Ambulatory Visit: Payer: 59 | Admitting: Emergency Medicine

## 2013-02-14 VITALS — BP 108/66 | HR 108 | Temp 98.3°F | Resp 20 | Ht 70.0 in | Wt 254.0 lb

## 2013-02-14 DIAGNOSIS — M79609 Pain in unspecified limb: Secondary | ICD-10-CM

## 2013-02-14 DIAGNOSIS — M7989 Other specified soft tissue disorders: Secondary | ICD-10-CM | POA: Insufficient documentation

## 2013-02-14 DIAGNOSIS — L03115 Cellulitis of right lower limb: Secondary | ICD-10-CM

## 2013-02-14 DIAGNOSIS — L02419 Cutaneous abscess of limb, unspecified: Secondary | ICD-10-CM

## 2013-02-14 NOTE — Progress Notes (Signed)
VASCULAR LAB PRELIMINARY  PRELIMINARY  PRELIMINARY  PRELIMINARY  Bilateral lower extremity venous Dopplers completed.    Preliminary report:  There is no DVT or SVT noted in the bilateral lower extremities.  Evva Din, RVT 02/14/2013, 5:50 PM

## 2013-02-14 NOTE — Progress Notes (Signed)
[facility] [address] [place] [address] [phone]  Date:  02/14/2013   Name:  [name] [name]   DOB:  1961-03-21   MRN:  [id number]  PCP:  [name], MD    Chief Complaint: Rash   History of Present Illness:  [name] [name] is a 52 y.o. very pleasant male patient who presents with the following:  Sedentary work in Engineer, technical sales.  Has pain and swelling in right lower leg.  No history of travel, injury or overuse.  Now has erythema lower leg.  No fever or chills, no nausea or vomiting.  No improvement with over the counter medications or other home remedies. Denies other complaint or health concern today.   Patient Active Problem List   Diagnosis Date Noted  . Obstructive sleep apnea 10/01/2012  . Complex partial seizure 04/19/2012  . Diabetes mellitus type II 09/25/2011  . Altered mental status 09/25/2011  . OTHER DYSPHAGIA 03/27/2009  . TRANSAMINASES, SERUM, ELEVATED 03/27/2009  . Cough 11/10/2008  . GERD 12/07/2007  . COLONIC POLYPS, ADENOMATOUS, HX OF 12/07/2007  . EXTERNAL HEMORRHOIDS 09/11/2007  . CROHN'S DISEASE, LARGE AND SMALL INTESTINES 09/11/2007  . OSTEOPOROSIS 09/11/2007  . HYPERLIPIDEMIA NEC/NOS 02/27/2007  . HYPERTENSION, ESSENTIAL NOS 02/27/2007    Past Medical History  Diagnosis Date  . Hypertension   . GERD (gastroesophageal reflux disease)   . Adenomatous colon polyp 10/1999  . Osteoporosis   . External hemorrhoids   . Crohn's disease of small and large intestines 1999  . Pneumonia   . Esophageal stricture   . MRSA (methicillin resistant Staphylococcus aureus) 10/2010   . Diabetes mellitus   . Hyperlipemia   . Cancer     skin  . Kidney stones   . Sleep apnea     CPAP machine  . PONV (postoperative nausea and vomiting)   . Complex partial seizure   . Migraines   . Seizures   . Cataract     Past Surgical History  Procedure Laterality Date  . Shoulder surgery    . Sinus surgery with instatrak    . Elbow surgery   2012    elbow MRSA infection   . Cataract extraction Bilateral   . Vasectomy    . Eye surgery      History  Substance Use Topics  . Smoking status: Never Smoker   . Smokeless tobacco: Never Used  . Alcohol Use: No    Family History  Problem Relation Age of Onset  . Colon polyps Father   . Heart disease Father   . Stroke Father   . Heart disease Mother   . Colon cancer Neg Hx   . Stroke Mother   . Stroke Paternal Grandmother   . Stroke Paternal Grandfather     Allergies  Allergen Reactions  . Azithromycin Swelling  . Dilaudid [Hydromorphone Hcl] Other (See Comments)    n/v  . Penicillins Hives  . Sulfonamide Derivatives Hives    Medication list has been reviewed and updated.  Current Outpatient Prescriptions on File Prior to Visit  Medication Sig Dispense Refill  . aspirin 81 MG tablet Take 81 mg by mouth daily.      Marland Kitchen CALCIUM PO Take 1 tablet by mouth 3 (three) times daily.       . Cyanocobalamin (VITAMIN B 12 PO) Take 1 tablet by mouth as needed.       . cyclobenzaprine (FLEXERIL) 10 MG tablet As directed as needed      .  diltiazem (TIAZAC) 240 MG 24 hr capsule Take 1 capsule (240 mg total) by mouth daily.  90 capsule  3  . eletriptan (RELPAX) 40 MG tablet Take 1 tablet at onset of headache, may repeat in 2 hours if headache persists or reoccurs.  12 tablet  4  . EPINEPHrine (EPIPEN) 0.3 mg/0.3 mL DEVI Inject 0.3 mLs (0.3 mg total) into the muscle once.  1 Device  1  . esomeprazole (NEXIUM) 40 MG capsule Take 30- 60 min before your first and last meals of the day  180 capsule  3  . folic acid (FOLVITE) 1 MG tablet Take 2 tablets (2 mg total) by mouth daily.  180 tablet  3  . glimepiride (AMARYL) 2 MG tablet Take 1 tablet (2 mg total) by mouth daily before breakfast.  90 tablet  3  . glucose blood (CHOICE DM FORA G20 TEST STRIPS) test strip Use as instructed  100 each  12  . HYDROcodone-acetaminophen (NORCO/VICODIN) 5-325 MG per tablet Take 1 tablet by mouth every 6  (six) hours as needed for pain.      . Insulin Glargine (LANTUS SOLOSTAR) 100 UNIT/ML SOPN Inject 35 Units into the skin daily.  5 pen  PRN  . Insulin Pen Needle (B-D UF III MINI PEN NEEDLES) 31G X 5 MM MISC Use to inject insulin daily.  100 each  2  . Lacosamide (VIMPAT) 100 MG TABS Take 1 tablet (100 mg total) by mouth 2 (two) times daily.  180 tablet  1  . Lactase (DAIRY-RELIEF PO) Take 3 tablets by mouth 3 (three) times daily.       Marland Kitchen levETIRAcetam (KEPPRA) 750 MG tablet Take 2 tablets (1,500 mg total) by mouth 2 (two) times daily.  360 tablet  1  . mercaptopurine (PURINETHOL) 50 MG tablet Take 100 mg by mouth 2 (two) times daily. Give on an empty stomach 1 hour before or 2 hours after meals. Caution: Chemotherapy.      . Mesalamine (ASACOL HD) 800 MG TBEC Take 2 tablets (1,600 mg total) by mouth 3 (three) times daily.  540 tablet  3  . metFORMIN (GLUCOPHAGE) 500 MG tablet Take 2 tablets (1,000 mg total) by mouth every evening.  180 tablet  3  . methscopolamine (PAMINE FORTE) 5 MG tablet Take 5 mg by mouth 2 (two) times daily.      Glory Rosebush DELICA LANCETS 40X MISC       . ONETOUCH DELICA LANCETS 73Z MISC USE AS INSTRUCTED (NEEDS OFFICE VISIT)  100 each  0  . potassium chloride SA (K-DUR,KLOR-CON) 20 MEQ tablet Take 1 tablet (20 mEq total) by mouth 2 (two) times daily.  180 tablet  3  . saccharomyces boulardii (FLORASTOR) 250 MG capsule Take 250 mg by mouth 2 (two) times daily.      . tamsulosin (FLOMAX) 0.4 MG CAPS Take 0.4 mg by mouth as needed.       . traMADol (ULTRAM) 50 MG tablet 1-2 every 4 hours as needed for cough or pain  40 tablet  0  . valsartan (DIOVAN) 160 MG tablet Take 80 mg by mouth daily.      Marland Kitchen doxycycline (VIBRAMYCIN) 100 MG capsule Take 1 capsule (100 mg total) by mouth 2 (two) times daily.  20 capsule  0   No current facility-administered medications on file prior to visit.    Review of Systems:  As per HPI, otherwise negative.    Physical Examination: Filed  Vitals:   02/14/13 1624  BP: 108/66  Pulse: 108  Temp: 98.3 F (36.8 C)  Resp: 20   Filed Vitals:   02/14/13 1624  Height: 5' 10"  (1.778 m)  Weight: 254 lb (115.214 kg)   Body mass index is 36.45 kg/(m^2). Ideal Body Weight: Weight in (lb) to have BMI = 25: 173.9   GEN: WDWN, NAD, Non-toxic, Alert & Oriented x 3 HEENT: Atraumatic, Normocephalic.  Ears and Nose: No external deformity. EXTR: No clubbing/cyanosis/edema NEURO: Normal gait.  PSYCH: Normally interactive. Conversant. Not depressed or anxious appearing.  Calm demeanor.  RIGHT leg:  Cellulitis lower leg and ankle with marked calf tenderness.    Assessment and Plan: Cellulitis leg doxycycline Venous doppler   Signed,  Ellison Carwin, MD

## 2013-02-14 NOTE — Patient Instructions (Signed)
Cellulitis Cellulitis is an infection of the skin and the tissue beneath it. The infected area is usually red and tender. Cellulitis occurs most often in the arms and lower legs.  CAUSES  Cellulitis is caused by bacteria that enter the skin through cracks or cuts in the skin. The most common types of bacteria that cause cellulitis are Staphylococcus and Streptococcus. SYMPTOMS   Redness and warmth.  Swelling.  Tenderness or pain.  Fever. DIAGNOSIS  Your caregiver can usually determine what is wrong based on a physical exam. Blood tests may also be done. TREATMENT  Treatment usually involves taking an antibiotic medicine. HOME CARE INSTRUCTIONS   Take your antibiotics as directed. Finish them even if you start to feel better.  Keep the infected arm or leg elevated to reduce swelling.  Apply a warm cloth to the affected area up to 4 times per day to relieve pain.  Only take over-the-counter or prescription medicines for pain, discomfort, or fever as directed by your caregiver.  Keep all follow-up appointments as directed by your caregiver. SEEK MEDICAL CARE IF:   You notice red streaks coming from the infected area.  Your red area gets larger or turns dark in color.  Your bone or joint underneath the infected area becomes painful after the skin has healed.  Your infection returns in the same area or another area.  You notice a swollen bump in the infected area.  You develop new symptoms. SEEK IMMEDIATE MEDICAL CARE IF:   You have a fever.  You feel very sleepy.  You develop vomiting or diarrhea.  You have a general ill feeling (malaise) with muscle aches and pains. MAKE SURE YOU:   Understand these instructions.  Will watch your condition.  Will get help right away if you are not doing well or get worse. Document Released: 03/20/2005 Document Revised: 12/10/2011 Document Reviewed: 08/26/2011 Exit[employer] Patient Information 2014 [employer].  

## 2013-02-15 ENCOUNTER — Other Ambulatory Visit: Payer: Self-pay | Admitting: Radiology

## 2013-02-15 MED ORDER — DOXYCYCLINE HYCLATE 100 MG PO CAPS: 100.0000 mg | ORAL_CAPSULE | Freq: Two times a day (BID) | ORAL | Status: DC

## 2013-02-15 NOTE — Telephone Encounter (Signed)
Spoke to patients wife Doxy sent to [facility] not [facility]

## 2013-03-04 ENCOUNTER — Ambulatory Visit: Payer: 59 | Admitting: Internal Medicine

## 2013-03-05 ENCOUNTER — Encounter: Payer: Self-pay | Admitting: *Deleted

## 2013-03-12 ENCOUNTER — Encounter: Payer: Self-pay | Admitting: Internal Medicine

## 2013-03-12 ENCOUNTER — Ambulatory Visit (INDEPENDENT_AMBULATORY_CARE_PROVIDER_SITE_OTHER): Payer: 59 | Admitting: Internal Medicine

## 2013-03-12 VITALS — BP 122/78 | HR 105 | Ht 71.0 in | Wt 246.4 lb

## 2013-03-12 DIAGNOSIS — K219 Gastro-esophageal reflux disease without esophagitis: Secondary | ICD-10-CM

## 2013-03-12 DIAGNOSIS — R000 Tachycardia, unspecified: Secondary | ICD-10-CM

## 2013-03-12 DIAGNOSIS — I10 Essential (primary) hypertension: Secondary | ICD-10-CM

## 2013-03-12 DIAGNOSIS — R9431 Abnormal electrocardiogram [ECG] [EKG]: Secondary | ICD-10-CM | POA: Insufficient documentation

## 2013-03-12 DIAGNOSIS — G4733 Obstructive sleep apnea (adult) (pediatric): Secondary | ICD-10-CM

## 2013-03-12 DIAGNOSIS — E785 Hyperlipidemia, unspecified: Secondary | ICD-10-CM

## 2013-03-12 DIAGNOSIS — R0609 Other forms of dyspnea: Secondary | ICD-10-CM | POA: Insufficient documentation

## 2013-03-12 DIAGNOSIS — R079 Chest pain, unspecified: Secondary | ICD-10-CM | POA: Insufficient documentation

## 2013-03-12 NOTE — Progress Notes (Signed)
OFFICE NOTE  Chief Complaint:  Chest pain, dyspnea, abnormal EKG  Primary Care Physician: [name], MD  HPI:  [name] [name] is a pleasant 52 year old male who was seen by Dr. Ellyn Hack about 2 years ago with a history of Crohn's disease, recurrent MRSA infections and an episode of chest pain while he was hospitalized. He underwent a stress test which was negative and walked 8 minutes reaching 10 metabolic equivalents. He also had an echocardiogram which showed an EF of 55-60% and possibly grade 1 diastolic dysfunction. [name] [name] has a history of pneumonia, obesity, obstructive sleep apnea, hypertension and seizures. Recently he was seen in urgent care and found to have an abnormal EKG with poor R-wave progression across of her corneum and Q waves in 3 and aVF. This was read by the computer as old anterior and inferior infarcts. EKG changes are new compared to his prior EKG.  He is also noted to be tachycardic, which is new for him as well.  He reports that he has been having some chest discomfort and shortness of breath with exertion. Symptoms are believed somewhat by rest, and occasionally he feels lightheaded or dizzy. There is concern for possible low blood pressure in the setting of diarrhea and possible dehydration.  PMHx:  Past Medical History  Diagnosis Date  . Hypertension   . GERD (gastroesophageal reflux disease)   . Adenomatous colon polyp 10/1999  . Osteoporosis   . External hemorrhoids   . Crohn's disease of small and large intestines 1999  . Pneumonia   . Esophageal stricture   . MRSA (methicillin resistant Staphylococcus aureus) 10/2010   . Diabetes mellitus   . Hyperlipemia   . Cancer     skin  . Kidney stones   . Sleep apnea     CPAP machine  . PONV (postoperative nausea and vomiting)   . Complex partial seizure   . Migraines   . Seizures   . Cataract   . History of nuclear stress test 11/15/2010    exercise myoview; normal pattern of perfusion; normal, low  risk study     Past Surgical History  Procedure Laterality Date  . Shoulder surgery    . Sinus surgery with instatrak    . Elbow surgery  2012    elbow MRSA infection   . Cataract extraction Bilateral   . Vasectomy    . Eye surgery    . Transthoracic echocardiogram  02/22/2011    EF 55-65%; increased pattern of LVH with mild conc hypertrophy, abnormal relaxation & increased filling pressure (grade 2 diastolic dysfunction); atrial septum thickened (lipomatous hypertrophy)    FAMHx:  Family History  Problem Relation Age of Onset  . Colon polyps Father   . Heart disease Father     HTN, aneursym  . Stroke Father   . Heart disease Mother     CABG at age 30  . Colon cancer Neg Hx   . Stroke Mother   . Stroke Paternal Grandmother   . Stroke Paternal Grandfather   . Other Child     tetrology of fallot (cornealia deland syndrome)    SOCHx:   reports that he has been passively smoking.  He has never used smokeless tobacco. He reports that he does not drink alcohol or use illicit drugs.  ALLERGIES:  Allergies  Allergen Reactions  . Azithromycin Swelling  . Dilaudid [Hydromorphone Hcl] Nausea And Vomiting  . Penicillins Hives  . Sulfonamide Derivatives Hives    ROS: A comprehensive review  of systems was negative except for: Constitutional: positive for fatigue Respiratory: positive for dyspnea on exertion Cardiovascular: positive for chest pain Gastrointestinal: positive for change in bowel habits, diarrhea and Crohn's disease  HOME MEDS: Current Outpatient Prescriptions  Medication Sig Dispense Refill  . aspirin 81 MG tablet Take 81 mg by mouth daily.      Marland Kitchen CALCIUM PO Take 1 tablet by mouth 3 (three) times daily.       . Cyanocobalamin (VITAMIN B 12 PO) Take 1 tablet by mouth as needed.       . diltiazem (TIAZAC) 240 MG 24 hr capsule Take 1 capsule (240 mg total) by mouth daily.  90 capsule  3  . eletriptan (RELPAX) 40 MG tablet Take 1 tablet at onset of headache, may  repeat in 2 hours if headache persists or reoccurs.  12 tablet  4  . EPINEPHrine (EPIPEN) 0.3 mg/0.3 mL DEVI Inject 0.3 mLs (0.3 mg total) into the muscle once.  1 Device  1  . esomeprazole (NEXIUM) 40 MG capsule Take 40 mg by mouth 2 (two) times daily. Take 30- 60 min before your first and last meals of the day      . folic acid (FOLVITE) 1 MG tablet Take 2 tablets (2 mg total) by mouth daily.  180 tablet  3  . glimepiride (AMARYL) 2 MG tablet Take 1 tablet (2 mg total) by mouth daily before breakfast.  90 tablet  3  . glucose blood (CHOICE DM FORA G20 TEST STRIPS) test strip Use as instructed  100 each  12  . HYDROcodone-acetaminophen (NORCO/VICODIN) 5-325 MG per tablet Take 1 tablet by mouth every 6 (six) hours as needed for pain.      . Insulin Glargine 100 UNIT/ML SOPN Inject 30-35 Units into the skin daily.      . Insulin Pen Needle (B-D UF III MINI PEN NEEDLES) 31G X 5 MM MISC Use to inject insulin daily.  100 each  2  . Lacosamide (VIMPAT) 100 MG TABS Take 1 tablet (100 mg total) by mouth 2 (two) times daily.  180 tablet  1  . Lactase (DAIRY-RELIEF PO) Take 3 tablets by mouth 3 (three) times daily.       Marland Kitchen levETIRAcetam (KEPPRA) 750 MG tablet Take 2 tablets (1,500 mg total) by mouth 2 (two) times daily.  360 tablet  1  . mercaptopurine (PURINETHOL) 50 MG tablet Take 100 mg by mouth 2 (two) times daily. Give on an empty stomach 1 hour before or 2 hours after meals. Caution: Chemotherapy.      . Mesalamine (ASACOL HD) 800 MG TBEC Take 2 tablets (1,600 mg total) by mouth 3 (three) times daily.  540 tablet  3  . metFORMIN (GLUCOPHAGE) 500 MG tablet Take 2 tablets (1,000 mg total) by mouth every evening.  180 tablet  3  . methscopolamine (PAMINE FORTE) 5 MG tablet Take 5 mg by mouth 2 (two) times daily.      Glory Rosebush DELICA LANCETS 51V MISC       . ONETOUCH DELICA LANCETS 61Y MISC USE AS INSTRUCTED (NEEDS OFFICE VISIT)  100 each  0  . potassium chloride SA (K-DUR,KLOR-CON) 20 MEQ tablet Take 1  tablet (20 mEq total) by mouth 2 (two) times daily.  180 tablet  3  . saccharomyces boulardii (FLORASTOR) 250 MG capsule Take 250 mg by mouth daily.       . tamsulosin (FLOMAX) 0.4 MG CAPS Take 0.4 mg by mouth as needed.       Marland Kitchen  traMADol (ULTRAM) 50 MG tablet 1-2 every 4 hours as needed for cough or pain  40 tablet  0  . valsartan (DIOVAN) 160 MG tablet Take 80 mg by mouth daily.       No current facility-administered medications for this visit.    LABS/IMAGING: No results found for this or any previous visit (from the past 48 hour(s)). No results found.  VITALS: BP 122/78  Pulse 105  Ht 5' 11"  (1.803 m)  Wt 246 lb 6.4 oz (111.766 kg)  BMI 34.38 kg/m2  EXAM: General appearance: alert and no distress Neck: no adenopathy, no carotid bruit, no JVD, supple, symmetrical, trachea midline and thyroid not enlarged, symmetric, no tenderness/mass/nodules Lungs: clear to auscultation bilaterally Heart: regular rate and rhythm, S1, S2 normal, no murmur, click, rub or gallop Abdomen: soft, non-tender; bowel sounds normal; no masses,  no organomegaly Extremities: extremities normal, atraumatic, no cyanosis or edema Pulses: 2+ and symmetric Skin: Skin color, texture, turgor normal. No rashes or lesions Neurologic: Grossly normal  EKG: Sinus tachycardia at 105, isolated Q wave in lead 3, poor R-wave progression across the precordium with late transition in lead V4  ASSESSMENT: 1. Abnormal EKG, possibly suggestive of infarct anteriorly, however I doubt inferior infarct with an isolated Q wave in lead 3. The previous EKG may have been abnormal due to lead position. 2. Shortness of breath and chest pain 3. Multiple cardiac risk factors 4. Tacyhcardia  PLAN: 1.   Mr. Pappalardo has a baseline tachycardia, Crohn's disease and multiple cardiac risk factors. His EKG is abnormal, although I'm not convinced it shows a new MI. Nevertheless, he is having shortness of breath and chest pain which should be  further evaluated. I recommend a lexiscan nuclear stress test to further evaluate. We can compare these images to his last study in 2012. Unfortunately I don't think these can be able to exercise enough on the treadmill.  Will plan to see him back to review the results of that study. He may also need to be placed on a beta blocker. Due to his low normal blood pressure we may discontinue his Diovan in order to add beta-blockade.  Thanks for the kind referral.  Pixie Casino, MD, [facility] Attending Cardiologist [facility] 03/12/2013, 4:38 PM

## 2013-03-12 NOTE — Patient Instructions (Addendum)
Your physician has requested that you have a lexiscan myoview. For further information please visit [email or web address]. Please follow instruction sheet, as given.  Please schedule a follow up visit with Dr. Debara Pickett after your test.

## 2013-03-17 ENCOUNTER — Ambulatory Visit (HOSPITAL_COMMUNITY)
Admission: RE | Admit: 2013-03-17 | Discharge: 2013-03-17 | Disposition: A | Discharge status: Home / Self Care | Payer: 59 | Source: Ambulatory Visit | Attending: Internal Medicine | Admitting: Internal Medicine

## 2013-03-17 DIAGNOSIS — R002 Palpitations: Secondary | ICD-10-CM | POA: Insufficient documentation

## 2013-03-17 DIAGNOSIS — R9431 Abnormal electrocardiogram [ECG] [EKG]: Secondary | ICD-10-CM

## 2013-03-17 DIAGNOSIS — R94111 Abnormal electroretinogram [ERG]: Secondary | ICD-10-CM

## 2013-03-17 DIAGNOSIS — R079 Chest pain, unspecified: Secondary | ICD-10-CM | POA: Insufficient documentation

## 2013-03-17 DIAGNOSIS — R000 Tachycardia, unspecified: Secondary | ICD-10-CM

## 2013-03-17 MED ORDER — TECHNETIUM TC 99M SESTAMIBI GENERIC - CARDIOLITE
10.0000 | Freq: Once | INTRAVENOUS | Status: AC | PRN
  Administered 2013-03-17: 10 via INTRAVENOUS

## 2013-03-17 MED ORDER — TECHNETIUM TC 99M SESTAMIBI GENERIC - CARDIOLITE
30.0000 | Freq: Once | INTRAVENOUS | Status: AC | PRN
  Administered 2013-03-17: 30 via INTRAVENOUS

## 2013-03-17 MED ORDER — REGADENOSON 0.4 MG/5ML IV SOLN
0.4000 mg | Freq: Once | INTRAVENOUS | Status: AC
  Administered 2013-03-17: 0.4 mg via INTRAVENOUS

## 2013-03-17 NOTE — Procedures (Addendum)
[place] [address] [place] [address] [phone]  Cardiology Nuclear Med Study  [name] [name] is a 52 y.o. male     MRN : [id number]     DOB: June 19, 1961  Procedure Date: 03/17/2013  Nuclear Med Background Indication for Stress Test:  Evaluation for Ischemia and Abnormal EKG History:  Asthma and ASTMA IS EXERCISE INDUCED Cardiac Risk Factors: Family History - CAD, Hypertension, IDDM Type 2, Lipids, Overweight and TACHYCARDIA  Symptoms:  Chest Pain, Dizziness, DOE, Fatigue, Light-Headedness, Palpitations and SOB   Nuclear Pre-Procedure Caffeine/Decaff Intake:  1:00am NPO After: 11AM   IV Site: R Hand  IV 0.9% NS with Angio Cath:  22g  Chest Size (in):  44" IV Started by: Azucena Cecil, RN  Height: 5' 11"  (1.803 m)  Cup Size: n/a  BMI:  Body mass index is 34.33 kg/(m^2). Weight:  246 lb (111.585 kg)   Tech Comments:  N/A    Nuclear Med Study 1 or 2 day study: 1 day  Stress Test Type:  [place] Provider:  Lyman Bishop, MD   Resting Radionuclide: Technetium 26mSestamibi  Resting Radionuclide Dose: 10.3 mCi   Stress Radionuclide:  Technetium 928mestamibi  Stress Radionuclide Dose: 30.6 mCi           Stress Protocol Rest HR: 85 Stress HR:101  Rest BP: 123/74 Stress BP: 115/77  Exercise Time (min): n/a METS: n/a          Dose of Adenosine (mg):  n/a Dose of Lexiscan: 0.4 mg  Dose of Atropine (mg): n/a Dose of Dobutamine: n/a mcg/kg/min (at max HR)  Stress Test Technologist: GwMellody MemosCCT Nuclear Technologist: RoOtho PerlCNMT   Rest Procedure:  Myocardial perfusion imaging was performed at rest 45 minutes following the intravenous administration of Technetium 9954mstamibi. Stress Procedure:  The patient received IV Lexiscan 0.4 mg over 15-seconds.  Technetium 33m72mtamibi injected at 30-seconds.  There were no significant changes with Lexiscan.  Quantitative spect images were  obtained after a 45 minute delay.  Transient Ischemic Dilatation (Normal <1.22):  1.00 Lung/Heart Ratio (Normal <0.45):  0.35 QGS EDV:  106 ml QGS ESV:  36 ml LV Ejection Fraction: 66%     Rest ECG: NSR - Normal EKG  Stress ECG: No significant change from baseline ECG  QPS Raw Data Images:  Normal; no motion artifact; normal heart/lung ratio. Stress Images:  Normal homogeneous uptake in all areas of the myocardium. Rest Images:  Normal homogeneous uptake in all areas of the myocardium. Subtraction (SDS):  No evidence of ischemia.  Impression Exercise Capacity:  Lexiscan with no exercise. BP Response:  Normal blood pressure response. Clinical Symptoms:  No significant symptoms noted. ECG Impression:  No significant ST segment change suggestive of ischemia. Comparison with Prior Nuclear Study: No significant change from previous study  Overall Impression:  Normal stress nuclear study.  LV Wall Motion:  NL LV Function; NL Wall Motion   Floetta Brickey, MD  03/17/2013 5:30 PM

## 2013-04-01 ENCOUNTER — Encounter: Payer: Self-pay | Admitting: Internal Medicine

## 2013-04-02 ENCOUNTER — Ambulatory Visit (INDEPENDENT_AMBULATORY_CARE_PROVIDER_SITE_OTHER): Payer: 59 | Admitting: Internal Medicine

## 2013-04-02 ENCOUNTER — Telehealth: Payer: Self-pay

## 2013-04-02 ENCOUNTER — Encounter: Payer: Self-pay | Admitting: Internal Medicine

## 2013-04-02 VITALS — BP 144/80 | HR 92 | Ht 71.0 in | Wt 246.6 lb

## 2013-04-02 DIAGNOSIS — R079 Chest pain, unspecified: Secondary | ICD-10-CM

## 2013-04-02 DIAGNOSIS — R9431 Abnormal electrocardiogram [ECG] [EKG]: Secondary | ICD-10-CM

## 2013-04-02 DIAGNOSIS — R569 Unspecified convulsions: Secondary | ICD-10-CM

## 2013-04-02 MED ORDER — METOPROLOL TARTRATE 25 MG PO TABS: 12.5000 mg | ORAL_TABLET | Freq: Two times a day (BID) | ORAL | Status: DC

## 2013-04-02 NOTE — Telephone Encounter (Signed)
I would like patient to have handicap placard indefinitely for medical reasons:  Weakness, crohn's disease, asthma

## 2013-04-02 NOTE — Telephone Encounter (Addendum)
Dr.Lauenstein, Pt would like to know if he should come in to have an OV in order to have a handicapp placard form completed. Best# [phone]

## 2013-04-02 NOTE — Telephone Encounter (Signed)
Please advise, does he qualify for H/C placard?

## 2013-04-02 NOTE — Patient Instructions (Signed)
Start taking metoprolol tartrate (Lopressor) 12.39m twice daily - you will need to cut your 241mtablets in half.   Your physician recommends that you schedule a follow-up appointment in: 1 month.

## 2013-04-04 ENCOUNTER — Encounter: Payer: Self-pay | Admitting: Internal Medicine

## 2013-04-04 DIAGNOSIS — R569 Unspecified convulsions: Secondary | ICD-10-CM | POA: Insufficient documentation

## 2013-04-04 NOTE — Progress Notes (Signed)
OFFICE NOTE  Chief Complaint:  Chest pain, dyspnea, abnormal EKG  Primary Care Physician: [name], MD  HPI:  [name] [name] is a pleasant 52 year old male who was seen by Dr. Ellyn Hack about 2 years ago with a history of Crohn's disease, recurrent MRSA infections and an episode of chest pain while he was hospitalized. He underwent a stress test which was negative and walked 8 minutes reaching 10 metabolic equivalents. He also had an echocardiogram which showed an EF of 55-60% and possibly grade 1 diastolic dysfunction. [name] [name] has a history of pneumonia, obesity, obstructive sleep apnea, hypertension and seizures. Recently he was seen in urgent care and found to have an abnormal EKG with poor R-wave progression across of her corneum and Q waves in 3 and aVF. This was read by the computer as old anterior and inferior infarcts. EKG changes are new compared to his prior EKG.  He is also noted to be tachycardic, which is new for him as well.  He reports that he has been having some chest discomfort and shortness of breath with exertion. Symptoms are believed somewhat by rest, and occasionally he feels lightheaded or dizzy. There is concern for possible low blood pressure in the setting of diarrhea and possible dehydration.  Based on his risk factors, shortness of breath and chest pain as well as mildly abnormal EKG, I recommended a nuclear stress test. He did undergo the nuclear stress test on 03/17/2013, which was negative for ischemia and EF was 66%. He reports he has had no further chest pain episodes, but did unfortunately have a seizure episode last night. Her views medication list indicates that he is on. High doses of Keppra as well as Vimpat.  PMHx:  Past Medical History  Diagnosis Date  . Hypertension   . GERD (gastroesophageal reflux disease)   . Adenomatous colon polyp 10/1999  . Osteoporosis   . External hemorrhoids   . Crohn's disease of small and large intestines 1999  .  Pneumonia   . Esophageal stricture   . MRSA (methicillin resistant Staphylococcus aureus) 10/2010   . Diabetes mellitus   . Hyperlipemia   . Cancer     skin  . Kidney stones   . Sleep apnea     CPAP machine  . PONV (postoperative nausea and vomiting)   . Complex partial seizure   . Migraines   . Seizures   . Cataract   . History of nuclear stress test 11/15/2010    exercise myoview; normal pattern of perfusion; normal, low risk study     Past Surgical History  Procedure Laterality Date  . Shoulder surgery    . Sinus surgery with instatrak    . Elbow surgery  2012    elbow MRSA infection   . Cataract extraction Bilateral   . Vasectomy    . Eye surgery    . Transthoracic echocardiogram  02/22/2011    EF 55-65%; increased pattern of LVH with mild conc hypertrophy, abnormal relaxation & increased filling pressure (grade 2 diastolic dysfunction); atrial septum thickened (lipomatous hypertrophy)    FAMHx:  Family History  Problem Relation Age of Onset  . Colon polyps Father   . Heart disease Father     HTN, aneursym  . Stroke Father   . Heart disease Mother     CABG at age 70  . Colon cancer Neg Hx   . Stroke Mother   . Stroke Paternal Grandmother   . Stroke Paternal Grandfather   .  Other Child     tetrology of fallot (cornealia deland syndrome)    SOCHx:   reports that he has been passively smoking.  He has never used smokeless tobacco. He reports that he does not drink alcohol or use illicit drugs.  ALLERGIES:  Allergies  Allergen Reactions  . Azithromycin Swelling  . Dilaudid [Hydromorphone Hcl] Nausea And Vomiting  . Penicillins Hives  . Sulfonamide Derivatives Hives    ROS: A comprehensive review of systems was negative except for: Constitutional: positive for fatigue Cardiovascular: positive for chest pain Neurological: positive for seizures  HOME MEDS: Current Outpatient Prescriptions  Medication Sig Dispense Refill  . aspirin 81 MG tablet Take 81  mg by mouth daily.      Marland Kitchen CALCIUM PO Take 1 tablet by mouth 3 (three) times daily.       . Cyanocobalamin (VITAMIN B 12 PO) Take 1 tablet by mouth as needed.       . diltiazem (TIAZAC) 240 MG 24 hr capsule Take 1 capsule (240 mg total) by mouth daily.  90 capsule  3  . eletriptan (RELPAX) 40 MG tablet Take 1 tablet at onset of headache, may repeat in 2 hours if headache persists or reoccurs.  12 tablet  4  . EPINEPHrine (EPIPEN) 0.3 mg/0.3 mL DEVI Inject 0.3 mLs (0.3 mg total) into the muscle once.  1 Device  1  . esomeprazole (NEXIUM) 40 MG capsule Take 40 mg by mouth 2 (two) times daily. Take 30- 60 min before your first and last meals of the day      . folic acid (FOLVITE) 1 MG tablet Take 2 tablets (2 mg total) by mouth daily.  180 tablet  3  . glimepiride (AMARYL) 2 MG tablet Take 1 tablet (2 mg total) by mouth daily before breakfast.  90 tablet  3  . glucose blood (CHOICE DM FORA G20 TEST STRIPS) test strip Use as instructed  100 each  12  . HYDROcodone-acetaminophen (NORCO/VICODIN) 5-325 MG per tablet Take 1 tablet by mouth every 6 (six) hours as needed for pain.      . Insulin Glargine 100 UNIT/ML SOPN Inject 30-35 Units into the skin daily.      . Insulin Pen Needle (B-D UF III MINI PEN NEEDLES) 31G X 5 MM MISC Use to inject insulin daily.  100 each  2  . Lacosamide (VIMPAT) 100 MG TABS Take 1 tablet (100 mg total) by mouth 2 (two) times daily.  180 tablet  1  . Lactase (DAIRY-RELIEF PO) Take 3 tablets by mouth 3 (three) times daily.       Marland Kitchen levETIRAcetam (KEPPRA) 750 MG tablet Take 2 tablets (1,500 mg total) by mouth 2 (two) times daily.  360 tablet  1  . mercaptopurine (PURINETHOL) 50 MG tablet Take 100 mg by mouth 2 (two) times daily. Give on an empty stomach 1 hour before or 2 hours after meals. Caution: Chemotherapy.      . Mesalamine (ASACOL HD) 800 MG TBEC Take 2 tablets (1,600 mg total) by mouth 3 (three) times daily.  540 tablet  3  . metFORMIN (GLUCOPHAGE) 500 MG tablet Take 2  tablets (1,000 mg total) by mouth every evening.  180 tablet  3  . methscopolamine (PAMINE FORTE) 5 MG tablet Take 5 mg by mouth 2 (two) times daily.      Glory Rosebush DELICA LANCETS 73U MISC       . ONETOUCH DELICA LANCETS 20U MISC USE AS INSTRUCTED (NEEDS OFFICE  VISIT)  100 each  0  . potassium chloride SA (K-DUR,KLOR-CON) 20 MEQ tablet Take 1 tablet (20 mEq total) by mouth 2 (two) times daily.  180 tablet  3  . saccharomyces boulardii (FLORASTOR) 250 MG capsule Take 250 mg by mouth daily.       . tamsulosin (FLOMAX) 0.4 MG CAPS Take 0.4 mg by mouth as needed.       . traMADol (ULTRAM) 50 MG tablet 1-2 every 4 hours as needed for cough or pain  40 tablet  0  . metoprolol tartrate (LOPRESSOR) 25 MG tablet Take 0.5 tablets (12.5 mg total) by mouth 2 (two) times daily.  30 tablet  3   No current facility-administered medications for this visit.    LABS/IMAGING: No results found for this or any previous visit (from the past 48 hour(s)). No results found.  VITALS: BP 144/80  Pulse 92  Ht 5' 11"  (1.803 m)  Wt 246 lb 9.6 oz (111.857 kg)  BMI 34.41 kg/m2  EXAM: deferred  EKG: Deferred  ASSESSMENT: 1. Abnormal EKG - negative nuclear stress test, EF 66% 2. Shortness of breath and chest pain - improved 3. Seizures - ongoing, despite high dose medication.  PLAN: 1.   Mr. Splawn is a low risk nuclear stress test, which is somewhat reassuring. He does have some cardiac risk factors that are significant, and the goal should be to continue to try to optimize his diabetes and other risk factors. He does have a history of esophageal stricture or and probably esophageal spasm given his history of Crohn's disease. He did report recently that he is having more trouble swallowing food, so I wonder if he is having esophageal spasm and/or stricture and this is the etiology of his chest pain. There could be numerous reasons for shortness of breath including possibly small vessel ischemia. The like to see  if he would benefit from a low-dose beta blocker. I recommend starting metoprolol tartrate 12.5 mg twice daily. Unfortunately, he tells me that he had a seizure last night that is certainly scary. He is on high doses of 2 medications and is followed at [facility] neurological. I encouraged him to try to get an appointment as soon as possible to see them back for further monitoring and medication adjustment as necessary. He can followup with me in 1 month to see how he is doing on the b-blocker.    Thanks again for the kind referral.  Pixie Casino, MD, [facility] Attending Cardiologist [facility] 04/04/2013, 6:45 PM

## 2013-04-05 ENCOUNTER — Telehealth: Payer: Self-pay | Admitting: Radiology

## 2013-04-05 NOTE — Telephone Encounter (Signed)
Patient advised HC placard application ready for pick up

## 2013-04-05 NOTE — Telephone Encounter (Signed)
Have placed this on your computer to sign.

## 2013-04-07 ENCOUNTER — Ambulatory Visit (INDEPENDENT_AMBULATORY_CARE_PROVIDER_SITE_OTHER): Payer: 59 | Admitting: Nurse Practitioner

## 2013-04-07 ENCOUNTER — Encounter: Payer: Self-pay | Admitting: Nurse Practitioner

## 2013-04-07 VITALS — BP 107/67 | HR 76 | Ht 72.0 in | Wt 250.0 lb

## 2013-04-07 DIAGNOSIS — G4733 Obstructive sleep apnea (adult) (pediatric): Secondary | ICD-10-CM

## 2013-04-07 DIAGNOSIS — G40209 Localization-related (focal) (partial) symptomatic epilepsy and epileptic syndromes with complex partial seizures, not intractable, without status epilepticus: Secondary | ICD-10-CM

## 2013-04-07 NOTE — Progress Notes (Signed)
[facility]  PATIENT: DELDRICK [name] DOB: 1961-05-21   REASON FOR VISIT: Followup seizure disorder   HISTORY OF PRESENT ILLNESS:[name] [name], 52 -year-old white male returns for followup. He has a history of severe sleep apnea and is currently using CPAP. He also has a history of seizure disorder and his Keppra was increased to 1500 mg twice a day 07/28/2012 due to confusional episodes. On return visit in April he was continuing to have confusional episodes but they were better. He was placed on Vimpat starter pack and is currently on 165m BID. He has had 2 additional seizures since last seen in this office 12/31/2012. He did not call into the office to have medication changes He complains of dizziness when changing positions.     REVIEW OF SYSTEMS: Full 14 system review of systems performed and notable only for:  Constitutional: Fatigue  Cardiovascular: N/A  Ear/Nose/Throat: Hearing loss Skin: N/A  Eyes: N/A  Respiratory: N/A  Gastroitestinal: [name] Hematology/Lymphatic: N/A  Endocrine: N/A Musculoskeletal:N/A  Allergy/Immunology: N/A  Neurological: Seizure Psychiatric: N/A   ALLERGIES: Allergies  Allergen Reactions  . Azithromycin Swelling  . Dilaudid [Hydromorphone Hcl] Nausea And Vomiting  . Penicillins Hives  . Sulfonamide Derivatives Hives    HOME MEDICATIONS: Outpatient Prescriptions Prior to Visit  Medication Sig Dispense Refill  . aspirin 81 MG tablet Take 81 mg by mouth daily.      .Marland KitchenCALCIUM PO Take 1 tablet by mouth 3 (three) times daily.       . Cyanocobalamin (VITAMIN B 12 PO) Take 1 tablet by mouth as needed.       . diltiazem (TIAZAC) 240 MG 24 hr capsule Take 1 capsule (240 mg total) by mouth daily.  90 capsule  3  . eletriptan (RELPAX) 40 MG tablet Take 1 tablet at onset of headache, may repeat in 2 hours if headache persists or reoccurs.  12 tablet  4  . EPINEPHrine (EPIPEN) 0.3 mg/0.3 mL DEVI Inject 0.3 mLs (0.3 mg total)  into the muscle once.  1 Device  1  . esomeprazole (NEXIUM) 40 MG capsule Take 40 mg by mouth 2 (two) times daily. Take 30- 60 min before your first and last meals of the day      . folic acid (FOLVITE) 1 MG tablet Take 2 tablets (2 mg total) by mouth daily.  180 tablet  3  . glimepiride (AMARYL) 2 MG tablet Take 1 tablet (2 mg total) by mouth daily before breakfast.  90 tablet  3  . glucose blood (CHOICE DM FORA G20 TEST STRIPS) test strip Use as instructed  100 each  12  . HYDROcodone-acetaminophen (NORCO/VICODIN) 5-325 MG per tablet Take 1 tablet by mouth every 6 (six) hours as needed for pain.      . Insulin Glargine 100 UNIT/ML SOPN Inject 30-35 Units into the skin daily.      . Insulin Pen Needle (B-D UF III MINI PEN NEEDLES) 31G X 5 MM MISC Use to inject insulin daily.  100 each  2  . Lactase (DAIRY-RELIEF PO) Take 3 tablets by mouth 3 (three) times daily.       .Marland KitchenlevETIRAcetam (KEPPRA) 750 MG tablet Take 2 tablets (1,500 mg total) by mouth 2 (two) times daily.  360 tablet  1  . mercaptopurine (PURINETHOL) 50 MG tablet Take 100 mg by mouth 2 (two) times daily. Give on an empty stomach 1 hour before or 2 hours after meals. Caution: Chemotherapy.      .Marland Kitchen  Mesalamine (ASACOL HD) 800 MG TBEC Take 2 tablets (1,600 mg total) by mouth 3 (three) times daily.  540 tablet  3  . metFORMIN (GLUCOPHAGE) 500 MG tablet Take 2 tablets (1,000 mg total) by mouth every evening.  180 tablet  3  . methscopolamine (PAMINE FORTE) 5 MG tablet Take 5 mg by mouth 2 (two) times daily.      . metoprolol tartrate (LOPRESSOR) 25 MG tablet Take 0.5 tablets (12.5 mg total) by mouth 2 (two) times daily.  30 tablet  3  . ONETOUCH DELICA LANCETS 27O MISC       . ONETOUCH DELICA LANCETS 53G MISC USE AS INSTRUCTED (NEEDS OFFICE VISIT)  100 each  0  . potassium chloride SA (K-DUR,KLOR-CON) 20 MEQ tablet Take 1 tablet (20 mEq total) by mouth 2 (two) times daily.  180 tablet  3  . saccharomyces boulardii (FLORASTOR) 250 MG capsule  Take 250 mg by mouth daily.       . tamsulosin (FLOMAX) 0.4 MG CAPS Take 0.4 mg by mouth as needed.       . traMADol (ULTRAM) 50 MG tablet 1-2 every 4 hours as needed for cough or pain  40 tablet  0  . Lacosamide (VIMPAT) 100 MG TABS Take 1 tablet (100 mg total) by mouth 2 (two) times daily.  180 tablet  1   No facility-administered medications prior to visit.    PAST MEDICAL HISTORY: Past Medical History  Diagnosis Date  . Hypertension   . GERD (gastroesophageal reflux disease)   . Adenomatous colon polyp 10/1999  . Osteoporosis   . External hemorrhoids   . Crohn's disease of small and large intestines 1999  . Pneumonia   . Esophageal stricture   . MRSA (methicillin resistant Staphylococcus aureus) 10/2010   . Diabetes mellitus   . Hyperlipemia   . Cancer     skin  . Kidney stones   . Sleep apnea     CPAP machine  . PONV (postoperative nausea and vomiting)   . Complex partial seizure   . Migraines   . Seizures   . Cataract   . History of nuclear stress test 11/15/2010    exercise myoview; normal pattern of perfusion; normal, low risk study     PAST SURGICAL HISTORY: Past Surgical History  Procedure Laterality Date  . Shoulder surgery    . Sinus surgery with instatrak    . Elbow surgery  2012    elbow MRSA infection   . Cataract extraction Bilateral   . Vasectomy    . Eye surgery    . Transthoracic echocardiogram  02/22/2011    EF 55-65%; increased pattern of LVH with mild conc hypertrophy, abnormal relaxation & increased filling pressure (grade 2 diastolic dysfunction); atrial septum thickened (lipomatous hypertrophy)    FAMILY HISTORY: Family History  Problem Relation Age of Onset  . Colon polyps Father   . Heart disease Father     HTN, aneursym  . Stroke Father   . Heart disease Mother     CABG at age 1  . Colon cancer Neg Hx   . Stroke Mother   . Stroke Paternal Grandmother   . Stroke Paternal Grandfather   . Other Child     tetrology of fallot  (cornealia deland syndrome)    SOCIAL HISTORY: History   Social History  . Marital Status: Married    Spouse Name: N/A    Number of Children: 2  . Years of Education: college  Occupational History  . DATA BASE ADMINISTRATOR [place]   Social History Main Topics  . Smoking status: Passive Smoke Exposure - Never Smoker  . Smokeless tobacco: Never Used  . Alcohol Use: No  . Drug Use: No  . Sexual Activity: Yes    Birth Control/ Protection: Surgical     Comment: 1 partner in last 12 months   Other Topics Concern  . Not on file   Social History Narrative  . No narrative on file     PHYSICAL EXAM  Filed Vitals:   04/07/13 1054  BP: 107/67  Pulse: 76  Height: 6' (1.829 m)  Weight: 250 lb (113.399 kg)   Body mass index is 33.9 kg/(m^2).  Generalized: Well developed, obese male in no acute distress  Head: normocephalic and atraumatic,. Oropharynx benign  Neck: Supple, no carotid bruits  Cardiac: Regular rate rhythm, no murmur     Neurological examination   Mentation: Alert oriented to time, place, history taking. Follows all commands speech and language fluent  Cranial nerve II-XII: Pupils were equal round reactive to light extraocular movements were full, visual field were full on confrontational test. Facial sensation and strength were normal. hearing was intact to finger rubbing bilaterally. Uvula tongue midline. head turning and shoulder shrug and were normal and symmetric.Tongue protrusion into cheek strength was normal. Motor: normal bulk and tone, full strength in the BUE, BLE, fine finger movements normal, no pronator drift. No focal weakness Sensory: normal and symmetric to light touch, pinprick, and  vibration  Coordination: finger-nose-finger, heel-to-shin bilaterally, no dysmetria Reflexes: Brachioradialis 2/2, biceps 2/2, triceps 2/2, patellar 2/2, Achilles 2/2, plantar responses were flexor bilaterally. Gait and Station: Rising up from seated  position without assistance, normal stance,  moderate stride, good arm swing, smooth turning, able to perform tiptoe, and heel walking without difficulty. Mildly unsteady with tandem gait  DIAGNOSTIC DATA (LABS, IMAGING, TESTING) - I reviewed patient records, labs, notes, testing and imaging myself where available.  Lab Results  Component Value Date   WBC 8.8 01/04/2013   HGB 14.7 01/04/2013   HCT 42.7 01/04/2013   MCV 98.4 01/04/2013   PLT 261 01/04/2013      Component Value Date/Time   NA 138 01/04/2013 2155   K 3.1* 01/04/2013 2155   CL 99 01/04/2013 2155   CO2 25 01/04/2013 2155   GLUCOSE 122* 01/04/2013 2155   BUN 12 01/04/2013 2155   CREATININE 0.72 01/04/2013 2155   CREATININE 0.79 12/20/2012 1349   CALCIUM 9.2 01/04/2013 2155   PROT 6.0 12/09/2012 1753   ALBUMIN 3.7 12/09/2012 1753   AST 54* 12/09/2012 1753   ALT 98* 12/09/2012 1753   ALKPHOS 72 12/09/2012 1753   BILITOT 0.6 12/09/2012 1753   GFRNONAA >90 01/04/2013 2155   GFRAA >90 01/04/2013 2155   Lab Results  Component Value Date   CHOL 209* 11/05/2012   HDL 39* 11/05/2012   LDLCALC 107* 11/05/2012   TRIG 315* 11/05/2012   CHOLHDL 5.4 11/05/2012   Lab Results  Component Value Date   HGBA1C 8.0 12/09/2012   Lab Results  Component Value Date   VITAMINB12 >2000* 08/06/2011   Lab Results  Component Value Date   TSH 1.715 09/29/2011      ASSESSMENT AND PLAN  52 y.o. year old male  has a past medical history of confusional state felt to represent seizure. EEG 08/15/2011 was mildly abnormal with evidence of mild background slowing indicative of mild cerebral dysfunction without seizure discharges. Currently  on Keppra 1500 twice daily  and Vimpat 100 twice daily. 2 additional seizure events seen in July.   Increase Vimpat to 167m BID, samples of 529mcaps given Call in 2 weeks after being on this dose for refill or further titration of drug Continue Keppra at current dose F/U in 6 months NaDennie BibleGNPrisma Health North Green[facility] Term Acute [facility] HospitalBCH[facility] APSulligenteurolog[facility] [address]SuLevittownrSteptoe[place] [phone]

## 2013-04-07 NOTE — Patient Instructions (Signed)
Increase Vimpat to 157m BID, samples of 552mcaps given Call in 2 weeks after being on this dose for refill or further titration of drug Continue Keppra at current dose F/U in 6 months

## 2013-04-13 ENCOUNTER — Other Ambulatory Visit: Payer: Self-pay | Admitting: Dermatology

## 2013-04-19 ENCOUNTER — Telehealth: Payer: Self-pay | Admitting: Neurology

## 2013-04-19 MED ORDER — LACOSAMIDE 150 MG PO TABS: 150.0000 mg | ORAL_TABLET | Freq: Two times a day (BID) | ORAL | Status: DC

## 2013-04-19 NOTE — Telephone Encounter (Signed)
Patient left message that he was told to call if he was doing fine on the new dose of Vimpat, 143m BID.  However he will need a new RX for the new amount sent to [facility].  He also said he will run out of his current amount of Vimpat tomorrow and it takes a week to get Rx from [facility] and is asking for more samples or for a short course called in.

## 2013-04-19 NOTE — Telephone Encounter (Signed)
Sample on your desk please call patient to pick up. RX ordered it is a C5 you will need to fax

## 2013-04-20 NOTE — Telephone Encounter (Signed)
Left message for patient to pick up samples.  Also new RX was faxed and confirmation received.

## 2013-04-29 ENCOUNTER — Other Ambulatory Visit: Payer: Self-pay

## 2013-05-03 ENCOUNTER — Encounter: Payer: Self-pay | Admitting: Internal Medicine

## 2013-05-03 ENCOUNTER — Ambulatory Visit (INDEPENDENT_AMBULATORY_CARE_PROVIDER_SITE_OTHER): Payer: 59 | Admitting: Internal Medicine

## 2013-05-03 VITALS — BP 104/60 | HR 69 | Ht 71.0 in | Wt 239.7 lb

## 2013-05-03 DIAGNOSIS — E119 Type 2 diabetes mellitus without complications: Secondary | ICD-10-CM

## 2013-05-03 DIAGNOSIS — I10 Essential (primary) hypertension: Secondary | ICD-10-CM

## 2013-05-03 DIAGNOSIS — R0609 Other forms of dyspnea: Secondary | ICD-10-CM

## 2013-05-03 DIAGNOSIS — R079 Chest pain, unspecified: Secondary | ICD-10-CM

## 2013-05-03 DIAGNOSIS — R569 Unspecified convulsions: Secondary | ICD-10-CM

## 2013-05-03 DIAGNOSIS — R9431 Abnormal electrocardiogram [ECG] [EKG]: Secondary | ICD-10-CM

## 2013-05-03 DIAGNOSIS — E785 Hyperlipidemia, unspecified: Secondary | ICD-10-CM

## 2013-05-03 MED ORDER — DILTIAZEM HCL ER BEADS 180 MG PO CP24: 180.0000 mg | ORAL_CAPSULE | Freq: Every day | ORAL | Status: DC

## 2013-05-03 MED ORDER — METOPROLOL TARTRATE 25 MG PO TABS: 12.5000 mg | ORAL_TABLET | Freq: Two times a day (BID) | ORAL | Status: DC

## 2013-05-03 NOTE — Patient Instructions (Signed)
Follow up in 6 months 

## 2013-05-03 NOTE — Progress Notes (Signed)
OFFICE NOTE  Chief Complaint:  Chest pain, dyspnea, abnormal EKG  Primary Care Physician: [name], MD  HPI:  [name] [name] is a pleasant 52 year old male who was seen by Dr. Ellyn Hack about 2 years ago with a history of Crohn's disease, recurrent MRSA infections and an episode of chest pain while he was hospitalized. He underwent a stress test which was negative and walked 8 minutes reaching 10 metabolic equivalents. He also had an echocardiogram which showed an EF of 55-60% and possibly grade 1 diastolic dysfunction. [name] [name] has a history of pneumonia, obesity, obstructive sleep apnea, hypertension and seizures. Recently he was seen in urgent care and found to have an abnormal EKG with poor R-wave progression across of her corneum and Q waves in 3 and aVF. This was read by the computer as old anterior and inferior infarcts. EKG changes are new compared to his prior EKG.  He is also noted to be tachycardic, which is new for him as well.  He reports that he has been having some chest discomfort and shortness of breath with exertion. Symptoms are believed somewhat by rest, and occasionally he feels lightheaded or dizzy. There is concern for possible low blood pressure in the setting of diarrhea and possible dehydration.  Based on his risk factors, shortness of breath and chest pain as well as mildly abnormal EKG, I recommended a nuclear stress test. He did undergo the nuclear stress test on 03/17/2013, which was negative for ischemia and EF was 66%. He reports he has had no further chest pain episodes, but did unfortunately have a seizure episode last night. Her views medication list indicates that he is on. High doses of Keppra as well as Vimpat.  Mr. Cryder returns today and reports an improvement in his symptoms including shortness of breath. It is notable that his heart rate is running about 15-20 points lower. I think this is attributable to the beta blocker. He has had recent adjustment  in his Vimpat, do to increase and seizures.  PMHx:  Past Medical History  Diagnosis Date  . Hypertension   . GERD (gastroesophageal reflux disease)   . Adenomatous colon polyp 10/1999  . Osteoporosis   . External hemorrhoids   . Crohn's disease of small and large intestines 1999  . Pneumonia   . Esophageal stricture   . MRSA (methicillin resistant Staphylococcus aureus) 10/2010   . Diabetes mellitus   . Hyperlipemia   . Cancer     skin  . Kidney stones   . Sleep apnea     CPAP machine  . PONV (postoperative nausea and vomiting)   . Complex partial seizure   . Migraines   . Seizures   . Cataract   . History of nuclear stress test 11/15/2010    exercise myoview; normal pattern of perfusion; normal, low risk study     Past Surgical History  Procedure Laterality Date  . Shoulder surgery    . Sinus surgery with instatrak    . Elbow surgery  2012    elbow MRSA infection   . Cataract extraction Bilateral   . Vasectomy    . Eye surgery    . Transthoracic echocardiogram  02/22/2011    EF 55-65%; increased pattern of LVH with mild conc hypertrophy, abnormal relaxation & increased filling pressure (grade 2 diastolic dysfunction); atrial septum thickened (lipomatous hypertrophy)    FAMHx:  Family History  Problem Relation Age of Onset  . Colon polyps Father   . Heart disease  Father     HTN, aneursym  . Stroke Father   . Heart disease Mother     CABG at age 31  . Colon cancer Neg Hx   . Stroke Mother   . Stroke Paternal Grandmother   . Stroke Paternal Grandfather   . Other Child     tetrology of fallot (cornealia deland syndrome)    SOCHx:   reports that he has been passively smoking.  He has never used smokeless tobacco. He reports that he does not drink alcohol or use illicit drugs.  ALLERGIES:  Allergies  Allergen Reactions  . Azithromycin Swelling  . Dilaudid [Hydromorphone Hcl] Nausea And Vomiting  . Penicillins Hives  . Sulfonamide Derivatives Hives     ROS: A comprehensive review of systems was negative except for: Constitutional: positive for fatigue Cardiovascular: positive for chest pain Neurological: positive for seizures  HOME MEDS: Current Outpatient Prescriptions  Medication Sig Dispense Refill  . aspirin 81 MG tablet Take 81 mg by mouth daily.      Marland Kitchen CALCIUM PO Take 1 tablet by mouth 3 (three) times daily.       . Cyanocobalamin (VITAMIN B 12 PO) Take 1 tablet by mouth as needed.       . eletriptan (RELPAX) 40 MG tablet Take 1 tablet at onset of headache, may repeat in 2 hours if headache persists or reoccurs.  12 tablet  4  . EPINEPHrine (EPIPEN) 0.3 mg/0.3 mL DEVI Inject 0.3 mLs (0.3 mg total) into the muscle once.  1 Device  1  . esomeprazole (NEXIUM) 40 MG capsule Take 40 mg by mouth 2 (two) times daily. Take 30- 60 min before your first and last meals of the day      . folic acid (FOLVITE) 1 MG tablet Take 2 tablets (2 mg total) by mouth daily.  180 tablet  3  . glimepiride (AMARYL) 2 MG tablet Take 1 tablet (2 mg total) by mouth daily before breakfast.  90 tablet  3  . glucose blood (CHOICE DM FORA G20 TEST STRIPS) test strip Use as instructed  100 each  12  . HYDROcodone-acetaminophen (NORCO/VICODIN) 5-325 MG per tablet Take 1 tablet by mouth every 6 (six) hours as needed for pain.      . Insulin Glargine 100 UNIT/ML SOPN Inject 12 Units into the skin daily.       . Insulin Pen Needle (B-D UF III MINI PEN NEEDLES) 31G X 5 MM MISC Use to inject insulin daily.  100 each  2  . Lacosamide (VIMPAT) 150 MG TABS Take 1 tablet (150 mg total) by mouth 2 (two) times daily.  180 tablet  1  . Lactase (DAIRY-RELIEF PO) Take 3 tablets by mouth 3 (three) times daily.       Marland Kitchen levETIRAcetam (KEPPRA) 750 MG tablet Take 2 tablets (1,500 mg total) by mouth 2 (two) times daily.  360 tablet  1  . mercaptopurine (PURINETHOL) 50 MG tablet Take 100 mg by mouth daily. Give on an empty stomach 1 hour before or 2 hours after meals. Caution:  Chemotherapy.      . Mesalamine (ASACOL HD) 800 MG TBEC Take 2 tablets (1,600 mg total) by mouth 3 (three) times daily.  540 tablet  3  . metFORMIN (GLUCOPHAGE) 500 MG tablet Take 2 tablets (1,000 mg total) by mouth every evening.  180 tablet  3  . methscopolamine (PAMINE FORTE) 5 MG tablet Take 5 mg by mouth 2 (two) times daily.      Marland Kitchen  ONETOUCH DELICA LANCETS 16X MISC       . ONETOUCH DELICA LANCETS 09U MISC USE AS INSTRUCTED (NEEDS OFFICE VISIT)  100 each  0  . potassium chloride SA (K-DUR,KLOR-CON) 20 MEQ tablet Take 1 tablet (20 mEq total) by mouth 2 (two) times daily.  180 tablet  3  . saccharomyces boulardii (FLORASTOR) 250 MG capsule Take 250 mg by mouth daily.       . tamsulosin (FLOMAX) 0.4 MG CAPS Take 0.4 mg by mouth as needed.       . traMADol (ULTRAM) 50 MG tablet 1-2 every 4 hours as needed for cough or pain  40 tablet  0  . diltiazem (DILTZAC) 180 MG 24 hr capsule Take 1 capsule (180 mg total) by mouth daily.  90 capsule  3  . metoprolol tartrate (LOPRESSOR) 25 MG tablet Take 0.5 tablets (12.5 mg total) by mouth 2 (two) times daily.  90 tablet  3   No current facility-administered medications for this visit.    LABS/IMAGING: No results found for this or any previous visit (from the past 48 hour(s)). No results found.  VITALS: BP 104/60  Pulse 69  Ht 5' 11"  (1.803 m)  Wt 239 lb 11.2 oz (108.727 kg)  BMI 33.45 kg/m2  EXAM: deferred  EKG: Deferred  ASSESSMENT: 1. Abnormal EKG - negative nuclear stress test, EF 66% 2. Shortness of breath and chest pain - improved on b-blocker 3. Seizures - ongoing, despite high dose medication, recent increase in Vimpat.  PLAN: 1.   Mr. Mckibben is a low risk nuclear stress test, which is somewhat reassuring.  I started him on low-dose beta blocker and he has noted an improvement in his shortness of breath. This can be explained by small vessel ischemia, diastolic dysfunction or combination of both. Today I did note that his blood  pressure is running somewhat low. He reports at times his blood pressure is in the 04V systolic. I would recommend decreasing his diltiazem to 180 mg daily to allow [place] to continue to use low-dose beta blocker. Otherwise no changes to his medicines today. I'll plan to see him back in 6 months.  Thanks again for the kind referral.  Pixie Casino, MD, [facility] Attending Cardiologist [facility] 05/03/2013, 5:55 PM

## 2013-05-08 ENCOUNTER — Encounter: Payer: Self-pay | Admitting: Family Medicine

## 2013-05-24 ENCOUNTER — Other Ambulatory Visit: Payer: Self-pay | Admitting: Dermatology

## 2013-05-27 ENCOUNTER — Telehealth: Payer: Self-pay | Admitting: Neurology

## 2013-05-27 NOTE — Telephone Encounter (Signed)
LMVM to inform patient that Dr. Aletha Halim not be in on 2/10 and that his appt has been changed to 2/19

## 2013-06-02 ENCOUNTER — Ambulatory Visit: Payer: 59 | Admitting: Nurse Practitioner

## 2013-06-26 ENCOUNTER — Other Ambulatory Visit: Payer: Self-pay | Admitting: Nurse Practitioner

## 2013-07-01 ENCOUNTER — Encounter: Payer: Self-pay | Admitting: Family Medicine

## 2013-07-01 ENCOUNTER — Other Ambulatory Visit: Payer: Self-pay | Admitting: Family Medicine

## 2013-07-01 ENCOUNTER — Ambulatory Visit (INDEPENDENT_AMBULATORY_CARE_PROVIDER_SITE_OTHER): Payer: 59 | Admitting: Family Medicine

## 2013-07-01 VITALS — BP 116/64 | HR 82 | Temp 98.1°F | Resp 16 | Ht 70.0 in | Wt 228.0 lb

## 2013-07-01 DIAGNOSIS — E119 Type 2 diabetes mellitus without complications: Secondary | ICD-10-CM

## 2013-07-01 DIAGNOSIS — K5090 Crohn's disease, unspecified, without complications: Secondary | ICD-10-CM

## 2013-07-01 DIAGNOSIS — Z0000 Encounter for general adult medical examination without abnormal findings: Secondary | ICD-10-CM

## 2013-07-01 LAB — CBC WITH DIFFERENTIAL/PLATELET
Basophils Absolute: 0 10*3/uL (ref 0.0–0.1)
Basophils Relative: 0 % (ref 0–1)
Eosinophils Absolute: 0.1 10*3/uL (ref 0.0–0.7)
Eosinophils Relative: 3 % (ref 0–5)
HCT: 34.8 % — ABNORMAL LOW (ref 39.0–52.0)
Hemoglobin: 12.1 g/dL — ABNORMAL LOW (ref 13.0–17.0)
Lymphocytes Relative: 19 % (ref 12–46)
Lymphs Abs: 0.9 10*3/uL (ref 0.7–4.0)
MCH: 35.2 pg — ABNORMAL HIGH (ref 26.0–34.0)
MCHC: 34.8 g/dL (ref 30.0–36.0)
MCV: 101.2 fL — ABNORMAL HIGH (ref 78.0–100.0)
Monocytes Absolute: 0.6 10*3/uL (ref 0.1–1.0)
Monocytes Relative: 12 % (ref 3–12)
Neutro Abs: 3.1 10*3/uL (ref 1.7–7.7)
Neutrophils Relative %: 66 % (ref 43–77)
Platelets: 162 10*3/uL (ref 150–400)
RBC: 3.44 MIL/uL — ABNORMAL LOW (ref 4.22–5.81)
RDW: 17 % — ABNORMAL HIGH (ref 11.5–15.5)
WBC: 4.8 10*3/uL (ref 4.0–10.5)

## 2013-07-01 LAB — LIPID PANEL
Cholesterol: 164 mg/dL (ref 0–200)
HDL: 41 mg/dL (ref >39)
LDL Cholesterol: 74 mg/dL (ref 0–99)
Total CHOL/HDL Ratio: 4 Ratio
Triglycerides: 245 mg/dL — ABNORMAL HIGH (ref <150)
VLDL: 49 mg/dL — ABNORMAL HIGH (ref 0–40)

## 2013-07-01 LAB — COMPLETE METABOLIC PANEL WITH GFR
ALT: 20 U/L (ref 0–53)
AST: 29 U/L (ref 0–37)
Albumin: 4 g/dL (ref 3.5–5.2)
Alkaline Phosphatase: 121 U/L — ABNORMAL HIGH (ref 39–117)
BUN: 13 mg/dL (ref 6–23)
CO2: 24 mEq/L (ref 19–32)
Calcium: 9.5 mg/dL (ref 8.4–10.5)
Chloride: 104 mEq/L (ref 96–112)
Creat: 0.71 mg/dL (ref 0.50–1.35)
GFR, Est African American: >89 mL/min
GFR, Est Non African American: >89 mL/min
Glucose, Bld: 101 mg/dL — ABNORMAL HIGH (ref 70–99)
Potassium: 3.9 mEq/L (ref 3.5–5.3)
Sodium: 137 mEq/L (ref 135–145)
Total Bilirubin: 1.1 mg/dL (ref 0.3–1.2)
Total Protein: 7 g/dL (ref 6.0–8.3)

## 2013-07-01 LAB — POCT URINALYSIS DIPSTICK
Bilirubin, UA: NEGATIVE
Blood, UA: NEGATIVE
Glucose, UA: NEGATIVE
Ketones, UA: NEGATIVE
Leukocytes, UA: NEGATIVE
Nitrite, UA: NEGATIVE
Protein, UA: NEGATIVE
Spec Grav, UA: >=1.03
Urobilinogen, UA: 1
pH, UA: 6

## 2013-07-01 LAB — SEDIMENTATION RATE: Sed Rate: 26 mm/hr — ABNORMAL HIGH (ref 0–16)

## 2013-07-01 LAB — POCT GLYCOSYLATED HEMOGLOBIN (HGB A1C): Hemoglobin A1C: 5.1

## 2013-07-01 LAB — IFOBT (OCCULT BLOOD): IFOBT: NEGATIVE

## 2013-07-01 MED ORDER — ONETOUCH DELICA LANCETS 33G MISC: 1.0000 | Freq: Two times a day (BID) | Status: DC

## 2013-07-01 NOTE — Progress Notes (Signed)
   Subjective:    Patient ID: [name] [name], male    DOB: 04-14-1961, 53 y.o.   MRN: [id number]  HPI    Review of Systems  Constitutional: Negative.   HENT: Negative.   Eyes: Negative.   Respiratory: Negative.   Cardiovascular: Negative.   Gastrointestinal: Negative.   Endocrine: Negative.   Genitourinary: Negative.   Musculoskeletal: Positive for arthralgias and gait problem.  Skin: Negative.   Allergic/Immunologic: Negative.   Neurological: Positive for seizures.  Hematological: Negative.   Psychiatric/Behavioral: Positive for confusion.       Objective:   Physical Exam        Assessment & Plan:

## 2013-07-01 NOTE — Progress Notes (Signed)
Subjective:    Patient ID: [name] [name], male    DOB: 1960-09-26, 53 y.o.   MRN: [id number]  HPI This is a 53 year old gentleman who comes in with his wife for complete physical. He works at [employer] in Engineer, technical sales. He has a daughter who is at [employer] as a Paramedic. He also has a disabled son who lives with him and his wife.  Patient's had a very difficult and stormy last 2 years. He's had seizures, diabetes that's been uncontrolled much of the time, Crohn's disease, recurrent MRSA.  Patient recently had full cardiac evaluation which was negative. His blood sugars within below 100 and he has occasionally had began having insulin reaction while taking only 10 units of Lantus daily. He continues on his Amaryl and metformin.  Patient has lost considerable amount of weight which he attributes to having had a recent problem with root canal.   Review of Systems Patient has no problems with vision since having cataract surgery last year Hearing is normal Swallowing is normal. He he recently had complicated with root canal Neck: Patient's having no problems moving his neck Orthopedics: Patient says that his anger is, hips, and knees are stiff. Chest: Patient does not complain of any cough or shortness of breath at this point Abdomen: The Crohn's disease is been largely quiescent. Patient is due for a colonoscopy. Neuro: Patient does have trouble with balance, but notes no change in sensation of his fingers or toes Skin: The patient had urologic surgery on his face, and both cheeks, revealing squamous cell type changes. Genitourinary: Some increase in nocturnal voiding.    Objective:   Physical Exam Patient is approaching his ideal weight. He still is overweight but his recent gradual weight loss now shows him to be much more trim. He's in no acute distress and is cheerful. Eyes: No retinopathy, good extraocular motion, pupils equal and reactive TMs: Normal Oropharynx: Clear with good  dentition Neck: Supple no adenopathy or thyromegaly, no bruits Chest: No significant skin lesions, clear to auscultation Heart: Regular no murmur or gallop Abdomen: Soft nontender without HSM Genitalia: Normal penis, circumcised, normal scrotum, no hernia Rectal exam: Smooth, diffusely enlarged prostate without nodularity Skin: No unusual lesions or rashes Neurologic: The patient moving 4 extremities equally, he does have a positive Romberg. Sensation is normal in all 4 extremities. Results for orders placed in visit on 07/01/13  POCT URINALYSIS DIPSTICK      Result Value Range   Color, UA yellow     Clarity, UA clear     Glucose, UA neg     Bilirubin, UA neg     Ketones, UA neg     Spec Grav, UA >=1.030     Blood, UA neg     pH, UA 6.0     Protein, UA neg     Urobilinogen, UA 1.0     Nitrite, UA neg     Leukocytes, UA Negative    POCT GLYCOSYLATED HEMOGLOBIN (HGB A1C)      Result Value Range   Hemoglobin A1C 5.1    IFOBT (OCCULT BLOOD)      Result Value Range   IFOBT Negative     Assessment: This is as good as it seen Mr. Mcmiller in years. His diabetes has come under control and he no longer needs to take insulin.  I still suspect that some of the seizure activity that he is demonstrated in the past is related to hypoglycemic events. At any rate  he's not having any since last August.  The recent cardiac evaluation is very reassuring.  Signed, [name], MD

## 2013-07-02 LAB — PSA: PSA: 0.27 ng/mL (ref <=4.00)

## 2013-07-02 LAB — VITAMIN B12: Vitamin B-12: 650 pg/mL (ref 211–911)

## 2013-07-02 LAB — MICROALBUMIN, URINE: Microalb, Ur: 1.27 mg/dL (ref 0.00–1.89)

## 2013-07-02 LAB — FOLATE: Folate: 17.6 ng/mL

## 2013-07-28 ENCOUNTER — Ambulatory Visit: Payer: 59 | Admitting: Neurology

## 2013-08-03 ENCOUNTER — Ambulatory Visit: Payer: 59 | Admitting: Neurology

## 2013-08-05 ENCOUNTER — Encounter: Payer: Self-pay | Admitting: Gastroenterology

## 2013-08-07 ENCOUNTER — Other Ambulatory Visit: Payer: Self-pay | Admitting: Gastroenterology

## 2013-08-11 ENCOUNTER — Telehealth: Payer: Self-pay | Admitting: Gastroenterology

## 2013-08-11 MED ORDER — MERCAPTOPURINE 50 MG PO TABS: 100.0000 mg | ORAL_TABLET | Freq: Every day | ORAL | Status: DC

## 2013-08-11 NOTE — Telephone Encounter (Signed)
Left a message for patient to return my call to find out what pharmacy to send the 55m to.

## 2013-08-11 NOTE — Telephone Encounter (Signed)
Patient states he has to have his medicine sent to [facility] for it to be cheaper. Informed patient that I can send 69m to [facility] for a 90 day supply but he does need to keep his Colonoscopy appt in April. Pt agreed and verbalized understanding that he needs a Colonoscopy for further refills.

## 2013-08-12 ENCOUNTER — Encounter: Payer: Self-pay | Admitting: Neurology

## 2013-08-12 ENCOUNTER — Ambulatory Visit (INDEPENDENT_AMBULATORY_CARE_PROVIDER_SITE_OTHER): Payer: 59 | Admitting: Neurology

## 2013-08-12 VITALS — BP 117/77 | HR 69 | Resp 16 | Ht 71.0 in | Wt 226.0 lb

## 2013-08-12 DIAGNOSIS — G4733 Obstructive sleep apnea (adult) (pediatric): Secondary | ICD-10-CM

## 2013-08-12 DIAGNOSIS — E669 Obesity, unspecified: Secondary | ICD-10-CM

## 2013-08-12 DIAGNOSIS — Z9989 Dependence on other enabling machines and devices: Principal | ICD-10-CM

## 2013-08-12 DIAGNOSIS — G40209 Localization-related (focal) (partial) symptomatic epilepsy and epileptic syndromes with complex partial seizures, not intractable, without status epilepticus: Secondary | ICD-10-CM

## 2013-08-12 NOTE — Progress Notes (Signed)
[facility]  Provider:  Larey Seat, M D  Referring Provider: [name], MD Primary Care Physician:  [name], MD  Chief Complaint  Patient presents with  . Follow-up    Room 11  . Sleep Apnea    HPI:  [name] [name] is a 53 y.o. male  Is seen here as a referral/ revisit  from Dr. Joseph [name] for CPAP compliance.     [name] is seen here today for his yearly CPAP compliance visit. He was diagnosed on 05-26-2011 with a severe obstructive sleep apnea with an AHI of 121.7,  prolonged hypoxemia at night the 71.5 minutes with an ED of 80%.  He was titrated to 10 cm water pressure without EPR , with  resolution of the apnea- his oxygen nadir also increased,  the total time in the saturation decreased to 11.4 minutes  The patient brought his CPAP to this visit and we were able to interrogate the machine.  His daily time on CPAP therapy is 8 hours 2 minutes, with an AHI of 2.2 and is still set at  CPAP pressure of 10 cm without  EPR of the last 100 days,  he has 100% compliance over therepay of  4 hours.  Todays Epworth Sleepiness Scale was endorsed at 20 points and her fatigue severity scale at 35 points- he is still excessively   daytime sleepy in spite of being compliant with very little obstructive sleep apnea left.  I would like to add that the patient is using VIMPAT and Keppra for the treatment of a seizure disorder but VIMPAT can cause a significant amount of grogginess.  Patient's sleep habits are as follows, innominate goes to bed between 9 and 10:30 PM, he falls asleep promptly and sleep through the night until he has to rise in the morning at about 6:15 AM. He did that on average 7 hours of sleep and as his CPAP machine has documented likely over  8 hours of sleep. He does not take daytime naps in spite of the high Epworth sleepiness score concerned.  He does a high air leak which may be related to the mask slipping off at night. He may have  fatigue from diabetes and / or from hypoxemia.  I will order an ONO.       Review of Systems: Out of a complete 14 system review, the patient complains of only the following symptoms, and all other reviewed systems are negative. epworth  20, high fatigue.   History   Social History  . Marital Status: Married    Spouse Name: [name] [name]    Number of Children: 2  . Years of Education: college   Occupational History  . 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 Smoking status: Passive Smoke Exposure - Never Smoker  . Smokeless tobacco: Never Used  . Alcohol Use: No  . Drug Use: No  . Sexual Activity: Yes    Birth Control/ Protection: Surgical     Comment: 1 partner in last 12 months   Other Topics Concern  . Not on file   Social History Narrative   Patient is married [name] [name]) and lives at home with his family.   Patient has two children.   Patient has a college education.   Patient is right-handed.   Patient drinks one cup of soda daily.    Family History  Problem Relation Age of Onset  . Colon polyps Father   . Heart  disease Father     HTN, aneursym  . Stroke Father   . Hyperlipidemia Father   . Hypertension Father   . Heart disease Mother     CABG at age 31  . Stroke Mother   . Hyperlipidemia Mother   . Hypertension Mother   . Colon cancer Neg Hx   . Stroke Paternal Grandmother   . Stroke Paternal Grandfather   . Other Child     tetrology of fallot (cornealia deland syndrome)    Past Medical History  Diagnosis Date  . Hypertension   . GERD (gastroesophageal reflux disease)   . Adenomatous colon polyp 10/1999  . Osteoporosis   . External hemorrhoids   . Crohn's disease of small and large intestines 1999  . Pneumonia   . Esophageal stricture   . MRSA (methicillin resistant Staphylococcus aureus) 10/2010   . Diabetes mellitus   . Hyperlipemia   . Cancer     skin  . Kidney stones   . Sleep apnea     CPAP machine  . PONV  (postoperative nausea and vomiting)   . Complex partial seizure   . Migraines   . Seizures   . Cataract   . History of nuclear stress test 11/15/2010    exercise myoview; normal pattern of perfusion; normal, low risk study     Past Surgical History  Procedure Laterality Date  . Shoulder surgery    . Sinus surgery with instatrak    . Elbow surgery  2012    elbow MRSA infection   . Cataract extraction Bilateral   . Vasectomy    . Eye surgery    . Transthoracic echocardiogram  02/22/2011    EF 55-65%; increased pattern of LVH with mild conc hypertrophy, abnormal relaxation & increased filling pressure (grade 2 diastolic dysfunction); atrial septum thickened (lipomatous hypertrophy)    Current Outpatient Prescriptions  Medication Sig Dispense Refill  . aspirin 81 MG tablet Take 81 mg by mouth daily.      Marland Kitchen CALCIUM PO Take 1 tablet by mouth 3 (three) times daily.       . Cyanocobalamin (VITAMIN B 12 PO) Take 1 tablet by mouth as needed.       . diltiazem (DILTZAC) 180 MG 24 hr capsule Take 1 capsule (180 mg total) by mouth daily.  90 capsule  3  . eletriptan (RELPAX) 40 MG tablet Take 1 tablet at onset of headache, may repeat in 2 hours if headache persists or reoccurs.  12 tablet  4  . EPINEPHrine (EPIPEN) 0.3 mg/0.3 mL DEVI Inject 0.3 mLs (0.3 mg total) into the muscle once.  1 Device  1  . esomeprazole (NEXIUM) 40 MG capsule Take 40 mg by mouth 2 (two) times daily. Take 30- 60 min before your first and last meals of the day      . folic acid (FOLVITE) 1 MG tablet Take 2 tablets (2 mg total) by mouth daily.  180 tablet  3  . glimepiride (AMARYL) 2 MG tablet Take 1 tablet (2 mg total) by mouth daily before breakfast.  90 tablet  3  . glucosamine-chondroitin 500-400 MG tablet Take 2 tablets by mouth daily.      Marland Kitchen glucose blood (CHOICE DM FORA G20 TEST STRIPS) test strip Use as instructed  100 each  12  . HYDROcodone-acetaminophen (NORCO/VICODIN) 5-325 MG per tablet Take 1 tablet by mouth  every 6 (six) hours as needed for pain.      . Lacosamide (VIMPAT) 150  MG TABS Take 1 tablet (150 mg total) by mouth 2 (two) times daily.  180 tablet  1  . Lactase (DAIRY-RELIEF PO) Take 3 tablets by mouth 3 (three) times daily.       Marland Kitchen levETIRAcetam (KEPPRA) 750 MG tablet TAKE 2 TABLETS (1500 MG TOTAL) TWICE A DAY  360 tablet  0  . mercaptopurine (PURINETHOL) 50 MG tablet Take 2 tablets (100 mg total) by mouth daily. Give on an empty stomach 1 hour before or 2 hours after meals. Caution: Chemotherapy.  180 tablet  0  . Mesalamine (ASACOL HD) 800 MG TBEC Take 2 tablets (1,600 mg total) by mouth 3 (three) times daily.  540 tablet  3  . metFORMIN (GLUCOPHAGE) 500 MG tablet Take 2 tablets (1,000 mg total) by mouth every evening.  180 tablet  3  . methscopolamine (PAMINE FORTE) 5 MG tablet Take 5 mg by mouth 2 (two) times daily.      . metoprolol tartrate (LOPRESSOR) 25 MG tablet Take 0.5 tablets (12.5 mg total) by mouth 2 (two) times daily.  90 tablet  3  . ONETOUCH DELICA LANCETS 46O MISC USE AS INSTRUCTED (NEEDS OFFICE VISIT)  100 each  0  . ONETOUCH DELICA LANCETS 03O MISC 1 Act by Does not apply route 2 (two) times daily.  100 each  6  . potassium chloride SA (K-DUR,KLOR-CON) 20 MEQ tablet Take 1 tablet (20 mEq total) by mouth 2 (two) times daily.  180 tablet  3  . PRESCRIPTION MEDICATION Pt need refill on insulin strips      . saccharomyces boulardii (FLORASTOR) 250 MG capsule Take 250 mg by mouth daily.       . tamsulosin (FLOMAX) 0.4 MG CAPS Take 0.4 mg by mouth as needed.       . traMADol (ULTRAM) 50 MG tablet 1-2 every 4 hours as needed for cough or pain  40 tablet  0   No current facility-administered medications for this visit.    Allergies as of 08/12/2013 - Review Complete 08/12/2013  Allergen Reaction Noted  . Azithromycin Swelling 08/16/2011  . Dilaudid [hydromorphone hcl] Nausea And Vomiting 10/22/2010  . Penicillins Hives   . Sulfonamide derivatives Hives     Vitals: BP  117/77  Pulse 69  Resp 16  Ht 5' 11"  (1.803 m)  Wt 226 lb (102.513 kg)  BMI 31.53 kg/m2 Last Weight:  Wt Readings from Last 1 Encounters:  08/12/13 226 lb (102.513 kg)   Last Height:   Ht Readings from Last 1 Encounters:  08/12/13 5' 11"  (1.803 m)    Physical exam:  General: The patient is awake, alert and appears not in acute distress. The patient is well groomed. Head: Normocephalic, atraumatic. Neck is supple. Mallampati 3, neck circumference: 16  Cardiovascular:  Regular rate and rhythm , without  murmurs or carotid bruit, and without distended neck veins. Respiratory: Lungs are clear to auscultation. Skin:  Without evidence of edema, or rash Trunk: BMI is  elevated and patient  has normal posture.  Neurologic exam : The patient is awake and alert, oriented to place and time.  Memory subjective  described as  impaired , by fatigue- he has to made notes all the time .  There is a normal attention span & concentration ability. Speech is fluent without   dysarthria, dysphonia or aphasia. Mood and affect are appropriate.  Cranial nerves: Pupils are equal and briskly reactive to light. Funduscopic exam without  evidence of pallor or edema. Extraocular movements  in vertical and horizontal planes intact and without nystagmus. Visual fields by finger perimetry are intact. Hearing to finger rub intact.  Facial sensation intact to fine touch. Facial motor strength is symmetric and tongue and uvula move midline.  Motor exam: Normal tone and normal muscle bulk and symmetric normal strength in all extremities.  Sensory:  Fine touch, pinprick and vibration were tested in all extremities.   Coordination: Rapid alternating movements in the fingers/hands is tested and normal. Finger-to-nose maneuver tested and normal without evidence of ataxia, dysmetria. He has a mild  tremor.  Gait and station: Patient walks without assistive device.Deep tendon reflexes: in the  upper and lower  extremities are symmetric and intact. Babinski maneuver  downgoing.   Assessment:  After physical and neurologic examination, review of laboratory studies, imaging, neurophysiology testing and pre-existing records, assessment is  1) severe OSA , treated with 10 cm CPAP. He has air leaks and will be  referred to DME for a mask fitting , perhaps a P10?  [facility] . 2) ONO on CPAP - evaluate for hypoxia.  3) tremor seems medication induced.  4) memory impairment known to Dr Krista Blue , who also treats  his seizure disorder.

## 2013-08-12 NOTE — Patient Instructions (Signed)
Epilepsy People with epilepsy have times when they shake and jerk uncontrollably (seizures). This happens when there is a sudden change in brain function. Epilepsy may have many possible causes. Anything that disturbs the normal pattern of brain cell activity can lead to seizures. HOME CARE   Follow your doctor's instructions about driving and safety during normal activities.  Get enough sleep.  Only take medicine as told by your doctor.  Avoid things that you know can cause you to have seizures (triggers).  Write down when your seizures happen and what you remember about each seizure. Write down anything you think may have caused the seizure to happen.  Tell the people you live and work with that you have seizures. Make sure they know how to help you. They should:  Cushion your head and body.  Turn you on your side.  Not restrain you.  Not place anything inside your mouth.  Call for local emergency medical help if there is any question about what has happened.  Keep all follow-up visits with your doctor. This is very important. GET HELP IF:  You get an infection or start to feel sick. You may have more seizures when you are sick.  You are having seizures more often.  Your seizure pattern is changing. GET HELP RIGHT AWAY IF:   A seizure does not stop after a few seconds or minutes.  A seizure causes you to have trouble breathing.  A seizure gives you a very bad headache.  A seizure makes you unable to speak or use a part of your body. Document Released: 04/07/2009 Document Revised: 03/31/2013 Document Reviewed: 01/20/2013 [facility] Patient Information 2014 [place].

## 2013-08-16 ENCOUNTER — Telehealth: Payer: Self-pay | Admitting: *Deleted

## 2013-08-16 NOTE — Telephone Encounter (Signed)
Pt to have MRI.   Pt claustrophobic, and would like some xanax.

## 2013-08-16 NOTE — Telephone Encounter (Signed)
Verbal order given ,   CC carolyn martin.

## 2013-08-17 NOTE — Telephone Encounter (Signed)
Medication placed up front and pt called.  He will p/u at his convenience.   Will need driver and he stated he is not driving anyway due to seizures.

## 2013-08-18 ENCOUNTER — Encounter: Payer: Self-pay | Admitting: Neurology

## 2013-08-18 ENCOUNTER — Encounter: Payer: Self-pay | Admitting: Family Medicine

## 2013-08-20 ENCOUNTER — Encounter: Payer: Self-pay | Admitting: Neurology

## 2013-08-23 ENCOUNTER — Ambulatory Visit: Payer: 59 | Admitting: Internal Medicine

## 2013-08-23 VITALS — BP 118/62 | HR 70 | Temp 98.0°F | Resp 16 | Ht 70.0 in | Wt 224.0 lb

## 2013-08-23 DIAGNOSIS — R109 Unspecified abdominal pain: Secondary | ICD-10-CM

## 2013-08-23 DIAGNOSIS — L03319 Cellulitis of trunk, unspecified: Secondary | ICD-10-CM

## 2013-08-23 DIAGNOSIS — R1030 Lower abdominal pain, unspecified: Secondary | ICD-10-CM

## 2013-08-23 DIAGNOSIS — L02219 Cutaneous abscess of trunk, unspecified: Secondary | ICD-10-CM

## 2013-08-23 DIAGNOSIS — L02214 Cutaneous abscess of groin: Secondary | ICD-10-CM

## 2013-08-23 MED ORDER — HYDROCODONE-ACETAMINOPHEN 5-325 MG PO TABS: 1.0000 | ORAL_TABLET | Freq: Four times a day (QID) | ORAL | Status: DC | PRN

## 2013-08-23 MED ORDER — DOXYCYCLINE HYCLATE 100 MG PO TABS: 100.0000 mg | ORAL_TABLET | Freq: Two times a day (BID) | ORAL | Status: DC

## 2013-08-23 NOTE — Progress Notes (Signed)
This chart was scribed for Tami Lin, MD by Einar Pheasant, ED Scribe. This patient was seen in room 1 and the patient's care was started at 6:55 PM. Subjective:    Patient ID: [name] [name], male    DOB: May 31, 1961, 53 y.o.   MRN: [id number]  HPI HPI Comments: [name] [name] is a 53 y.o. male who presents to [facility] complaining of an abscess to the crease or his right testicle that started 2 days ago. Pt is complaining of associated pain and pressure. He states that he has had many similar episodes. Pt has a history of Chron's Disease. He is otherwise heathy. Denies any pertinent medical history.    Patient Active Problem List   Diagnosis Date Noted  . Seizure 04/04/2013  . Chest pain 03/12/2013  . Abnormal EKG 03/12/2013  . DOE (dyspnea on exertion) 03/12/2013  . Obstructive sleep apnea 10/01/2012  . Complex partial seizure 04/19/2012  . DM2 (diabetes mellitus, type 2) 09/25/2011  . Altered mental status 09/25/2011  . OTHER DYSPHAGIA 03/27/2009  . TRANSAMINASES, SERUM, ELEVATED 03/27/2009  . Cough 11/10/2008  . GERD 12/07/2007  . COLONIC POLYPS, ADENOMATOUS, HX OF 12/07/2007  . EXTERNAL HEMORRHOIDS 09/11/2007  . CROHN'S DISEASE, LARGE AND SMALL INTESTINES 09/11/2007  . OSTEOPOROSIS 09/11/2007  . HYPERLIPIDEMIA NEC/NOS 02/27/2007  . HYPERTENSION, ESSENTIAL NOS 02/27/2007   Past Medical History  Diagnosis Date  . Hypertension   . GERD (gastroesophageal reflux disease)   . Adenomatous colon polyp 10/1999  . Osteoporosis   . External hemorrhoids   . Crohn's disease of small and large intestines 1999  . Pneumonia   . Esophageal stricture   . MRSA (methicillin resistant Staphylococcus aureus) 10/2010   . Diabetes mellitus   . Hyperlipemia   . Cancer     skin  . Kidney stones   . Sleep apnea     CPAP machine  . PONV (postoperative nausea and vomiting)   . Complex partial seizure   . Migraines   . Seizures   . Cataract   . History of nuclear stress test 11/15/2010   exercise myoview; normal pattern of perfusion; normal, low risk study    Past Surgical History  Procedure Laterality Date  . Shoulder surgery    . Sinus surgery with instatrak    . Elbow surgery  2012    elbow MRSA infection   . Cataract extraction Bilateral   . Vasectomy    . Eye surgery    . Transthoracic echocardiogram  02/22/2011    EF 55-65%; increased pattern of LVH with mild conc hypertrophy, abnormal relaxation & increased filling pressure (grade 2 diastolic dysfunction); atrial septum thickened (lipomatous hypertrophy)   Allergies  Allergen Reactions  . Azithromycin Swelling  . Dilaudid [Hydromorphone Hcl] Nausea And Vomiting  . Penicillins Hives  . Sulfonamide Derivatives Hives   Prior to Admission medications   Medication Sig Start Date End Date Taking? Authorizing Provider  aspirin 81 MG tablet Take 81 mg by mouth daily.   Yes Historical Provider, MD  CALCIUM PO Take 1 tablet by mouth 3 (three) times daily.    Yes Historical Provider, MD  Cyanocobalamin (VITAMIN B 12 PO) Take 1 tablet by mouth as needed.    Yes Historical Provider, MD  diltiazem (DILTZAC) 180 MG 24 hr capsule Take 1 capsule (180 mg total) by mouth daily. 05/03/13  Yes Pixie Casino, MD  eletriptan (RELPAX) 40 MG tablet Take 1 tablet at onset of headache, may repeat in 2 hours  if headache persists or reoccurs. 01/18/13  Yes Wendie Agreste, MD  EPINEPHrine (EPIPEN) 0.3 mg/0.3 mL DEVI Inject 0.3 mLs (0.3 mg total) into the muscle once. 02/26/12  Yes Wardell Honour, MD  esomeprazole (NEXIUM) 40 MG capsule Take 40 mg by mouth 2 (two) times daily. Take 30- 60 min before your first and last meals of the day 01/29/13  Yes Tanda Rockers, MD  folic acid (FOLVITE) 1 MG tablet Take 2 tablets (2 mg total) by mouth daily. 12/22/12  Yes [name], MD  glimepiride (AMARYL) 2 MG tablet Take 1 tablet (2 mg total) by mouth daily before breakfast. 11/13/12  Yes [name], MD  glucosamine-chondroitin 500-400 MG tablet  Take 2 tablets by mouth daily.   Yes Historical Provider, MD  glucose blood (CHOICE DM FORA G20 TEST STRIPS) test strip Use as instructed 11/13/12 11/13/13 Yes [name], MD  HYDROcodone-acetaminophen (NORCO/VICODIN) 5-325 MG per tablet Take 1 tablet by mouth every 6 (six) hours as needed for pain.   Yes Historical Provider, MD  Lacosamide (VIMPAT) 150 MG TABS Take 1 tablet (150 mg total) by mouth 2 (two) times daily. 04/19/13  Yes Dennie Bible, NP  Lactase (DAIRY-RELIEF PO) Take 3 tablets by mouth 3 (three) times daily.    Yes Historical Provider, MD  levETIRAcetam (KEPPRA) 750 MG tablet TAKE 2 TABLETS (1500 MG TOTAL) TWICE A DAY 06/26/13  Yes Asencion Partridge Dohmeier, MD  mercaptopurine (PURINETHOL) 50 MG tablet Take 2 tablets (100 mg total) by mouth daily. Give on an empty stomach 1 hour before or 2 hours after meals. Caution: Chemotherapy. 08/11/13  Yes Ladene Artist, MD  Mesalamine (ASACOL HD) 800 MG TBEC Take 2 tablets (1,600 mg total) by mouth 3 (three) times daily. 11/09/12  Yes Ladene Artist, MD  metFORMIN (GLUCOPHAGE) 500 MG tablet Take 2 tablets (1,000 mg total) by mouth every evening. 11/13/12  Yes [name], MD  methscopolamine (PAMINE FORTE) 5 MG tablet Take 5 mg by mouth 2 (two) times daily.   Yes Historical Provider, MD  metoprolol tartrate (LOPRESSOR) 25 MG tablet Take 0.5 tablets (12.5 mg total) by mouth 2 (two) times daily. 05/03/13  Yes Pixie Casino, MD  [facility] DELICA LANCETS 70J MISC USE AS INSTRUCTED (NEEDS OFFICE VISIT) 02/04/13  Yes Ryan M Dunn, PA-C  [facility] DELICA LANCETS 62E MISC 1 Act by Does not apply route 2 (two) times daily. 07/01/13  Yes [name], MD  potassium chloride SA (K-DUR,KLOR-CON) 20 MEQ tablet Take 1 tablet (20 mEq total) by mouth 2 (two) times daily. 11/13/12  Yes [name], MD  PRESCRIPTION MEDICATION Pt need refill on insulin strips   Yes Historical Provider, MD  saccharomyces boulardii (FLORASTOR) 250 MG capsule Take 250 mg by  mouth daily.    Yes Historical Provider, MD  tamsulosin (FLOMAX) 0.4 MG CAPS Take 0.4 mg by mouth as needed.    Yes Historical Provider, MD  traMADol Veatrice Bourbon) 50 MG tablet 1-2 every 4 hours as needed for cough or pain 01/12/13  Yes Tanda Rockers, MD   History   Social History  . Marital Status: Married    Spouse Name: [name]    Number of Children: 2  . Years of Education: college   Occupational History  . 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 Smoking status: Passive Smoke Exposure - Never Smoker  . Smokeless tobacco: Never Used  . Alcohol Use: No  . Drug Use: No  .  Sexual Activity: Yes    Birth Control/ Protection: Surgical     Comment: 1 partner in last 12 months   Other Topics Concern  . Not on file   Social History Narrative   Patient is married [name]) and lives at home with his family.   Patient has two children.   Patient has a college education.   Patient is right-handed.   Patient drinks one cup of soda daily.     Review of Systems     Objective:   Physical Exam  Nursing note and vitals reviewed. Constitutional: He appears well-developed and well-nourished. No distress.  HENT:  Head: Normocephalic.  Eyes: Pupils are equal, round, and reactive to light.  Neck: Neck supple.  Cardiovascular: Normal rate.   Pulmonary/Chest: Effort normal.  Musculoskeletal: He exhibits no edema.  Neurological: He is alert.  Skin: Skin is warm. No rash noted.  Tender abscess in the right groin-at the edge of the scrotum/positive erythema   Psychiatric: He has a normal mood and affect.     Filed Vitals:   08/23/13 1748  BP: 118/62  Pulse: 70  Temp: 98 F (36.7 C)  Resp: 16  Height: 5' 10"  (1.778 m)  Weight: 224 lb (101.606 kg)  SpO2: 98%        Assessment & Plan:  Abscess of groin, right - Plan: HYDROcodone-acetaminophen (NORCO/VICODIN) 5-325 MG per tablet, Wound culture  Pain in the groin  Meds ordered this encounter    Medications  . doxycycline (VIBRA-TABS) 100 MG tablet    Sig: Take 1 tablet (100 mg total) by mouth 2 (two) times daily. Remain upright for 1 hour after taking med    Dispense:  20 tablet    Refill:  0  . HYDROcodone-acetaminophen (NORCO/VICODIN) 5-325 MG per tablet    Sig: Take 1 tablet by mouth every 6 (six) hours as needed for moderate pain.    Dispense:  15 tablet    Refill:  0    Order Specific Question:  Supervising Provider    Answer:  Wardell Honour [[id number]]      I have completed the patient encounter in its entirety as documented by the scribe, with editing by me where necessary. Robert P. Laney Pastor, M.D.

## 2013-08-23 NOTE — Progress Notes (Signed)
   Patient ID: [name] [name] MRN: [id number], DOB: 1960/07/24, 53 y.o. Date of Encounter: 08/23/2013, 7:41 PM    PROCEDURE NOTE: Verbal consent obtained. Risks and benefits of the procedure were explained to the patient. Patient made an informed decision to proceed with the procedure. Betadine prep per usual protocol. Local anesthesia obtained with 1% lidocaine with epi 2 cc  1 cm incision made with 11 blade along lesion.  Culture taken. Moderate purulence expressed. Lesion explored revealing no loculations. Irrigated with NS Packed with 1/4 inch plain gauze Dressed. Wound care instructions including precautions with patient. Patient tolerated the procedure well. Patient requested -Norco 5/325 mg 1 po q 4-6 hours prn pain #15 no RF, SED States he cannot tolerated Ultram, but he can tolerate Norco Recheck in 48 hours.      Signed, Christell Faith, MHS, PA-C [facility] [place],  [id number] [facility] 08/23/2013 7:41 PM

## 2013-08-25 ENCOUNTER — Telehealth: Payer: Self-pay | Admitting: Neurology

## 2013-08-25 ENCOUNTER — Ambulatory Visit (INDEPENDENT_AMBULATORY_CARE_PROVIDER_SITE_OTHER): Payer: 59 | Admitting: Physician Assistant

## 2013-08-25 VITALS — BP 100/60 | HR 84 | Temp 97.9°F | Resp 16 | Ht 69.75 in | Wt 225.6 lb

## 2013-08-25 DIAGNOSIS — L02214 Cutaneous abscess of groin: Secondary | ICD-10-CM

## 2013-08-25 DIAGNOSIS — L03319 Cellulitis of trunk, unspecified: Secondary | ICD-10-CM

## 2013-08-25 DIAGNOSIS — L02219 Cutaneous abscess of trunk, unspecified: Secondary | ICD-10-CM

## 2013-08-25 NOTE — Progress Notes (Signed)
   Subjective:    Patient ID: [name] [name], male    DOB: 26-May-1961, 53 y.o.   MRN: [id number]  HPI   [name] is a very pleasant 53 yr old male here for wound care following I&D of an abscess at his groin 48 hours ago.  He states he is feeling much better.  Still with some pain, but much less than before.  He is taking the antibiotics as directed and tolerating them well.  He is changing the dressing regularly.  Review of Systems  Constitutional: Negative for fever and chills.  Respiratory: Negative.   Cardiovascular: Negative.   Gastrointestinal: Negative.   Skin: Positive for wound.       Objective:   Physical Exam  Vitals reviewed. Constitutional: He is oriented to person, place, and time. He appears well-developed and well-nourished. No distress.  HENT:  Head: Normocephalic and atraumatic.  Eyes: Conjunctivae are normal. No scleral icterus.  Pulmonary/Chest: Effort normal.  Neurological: He is alert and oriented to person, place, and time.  Skin: Skin is warm and dry.  Healing abscess at right groin; no erythema or induration; mild TTP; no drainage; I have not repacked  Psychiatric: He has a normal mood and affect. His behavior is normal.    Cx: preliminarily growing GNRs      Assessment & Plan:  Abscess of groin, right   [name] is a very pleasant 53 yr old male here for wound care following I&D 48 hours ago.  The area appears to be healing well.  I have not repacked today.  Continue daily dressing changes until completely healed.  Continue abx.  Cx still preliminary.  Will adjust tx if necessary.  Pt to RTC if concerns.  Pt to call or RTC if worsening or not improving  E. Natividad Brood MHS, PA-C [facility] 3/4/20155:36 PM

## 2013-08-25 NOTE — Telephone Encounter (Signed)
I called the patient to provide results from overnight pulse oximetry testing.  Left a message requesting a call back to the office.

## 2013-08-26 ENCOUNTER — Other Ambulatory Visit: Payer: Self-pay | Admitting: Physician Assistant

## 2013-08-26 ENCOUNTER — Ambulatory Visit
Admission: RE | Admit: 2013-08-26 | Discharge: 2013-08-26 | Disposition: A | Discharge status: Home / Self Care | Payer: 59 | Source: Ambulatory Visit | Attending: Neurology | Admitting: Neurology

## 2013-08-26 DIAGNOSIS — G40209 Localization-related (focal) (partial) symptomatic epilepsy and epileptic syndromes with complex partial seizures, not intractable, without status epilepticus: Secondary | ICD-10-CM

## 2013-08-26 DIAGNOSIS — E669 Obesity, unspecified: Secondary | ICD-10-CM

## 2013-08-26 DIAGNOSIS — L02214 Cutaneous abscess of groin: Secondary | ICD-10-CM

## 2013-08-26 DIAGNOSIS — G4733 Obstructive sleep apnea (adult) (pediatric): Secondary | ICD-10-CM

## 2013-08-26 DIAGNOSIS — Z9989 Dependence on other enabling machines and devices: Principal | ICD-10-CM

## 2013-08-26 DIAGNOSIS — R569 Unspecified convulsions: Secondary | ICD-10-CM

## 2013-08-26 LAB — WOUND CULTURE
Gram Stain: NONE SEEN
Gram Stain: NONE SEEN

## 2013-08-26 MED ORDER — GADOBENATE DIMEGLUMINE 529 MG/ML IV SOLN
20.0000 mL | Freq: Once | INTRAVENOUS | Status: AC | PRN
  Administered 2013-08-26: 20 mL via INTRAVENOUS

## 2013-08-26 MED ORDER — CIPROFLOXACIN HCL 500 MG PO TABS: 500.0000 mg | ORAL_TABLET | Freq: Two times a day (BID) | ORAL | Status: DC

## 2013-08-26 NOTE — Telephone Encounter (Signed)
Spoke to pt he is aware, he will d/c the doxy and will pick up cipro and begin this Bid.

## 2013-08-31 NOTE — Progress Notes (Signed)
Quick Note:  Shared normal MRI results with patient, thru VM message ______

## 2013-09-18 ENCOUNTER — Other Ambulatory Visit: Payer: Self-pay | Admitting: Gastroenterology

## 2013-09-25 ENCOUNTER — Other Ambulatory Visit: Payer: Self-pay | Admitting: Physician Assistant

## 2013-09-25 ENCOUNTER — Other Ambulatory Visit: Payer: Self-pay | Admitting: Family Medicine

## 2013-09-25 ENCOUNTER — Other Ambulatory Visit: Payer: Self-pay | Admitting: Neurology

## 2013-09-28 ENCOUNTER — Other Ambulatory Visit: Payer: Self-pay

## 2013-09-28 MED ORDER — LACOSAMIDE 150 MG PO TABS: 150.0000 mg | ORAL_TABLET | Freq: Two times a day (BID) | ORAL | Status: DC

## 2013-09-28 NOTE — Telephone Encounter (Signed)
Dr Krista Blue is out of the office forwarding request to [facility].

## 2013-09-29 NOTE — Telephone Encounter (Signed)
Rx has been faxed.

## 2013-09-30 ENCOUNTER — Ambulatory Visit (AMBULATORY_SURGERY_CENTER): Payer: Self-pay

## 2013-09-30 VITALS — Ht 71.0 in | Wt 228.0 lb

## 2013-09-30 DIAGNOSIS — Z860100 Personal history of colon polyps, unspecified: Secondary | ICD-10-CM

## 2013-09-30 MED ORDER — SOD PICOSULFATE-MAG OX-CIT ACD 10-3.5-12 MG-GM-GM PO PACK: 1.0000 | PACK | Freq: Once | ORAL | Status: DC

## 2013-10-04 ENCOUNTER — Encounter: Payer: Self-pay | Admitting: Gastroenterology

## 2013-10-06 ENCOUNTER — Ambulatory Visit (INDEPENDENT_AMBULATORY_CARE_PROVIDER_SITE_OTHER): Payer: 59 | Admitting: Nurse Practitioner

## 2013-10-06 ENCOUNTER — Encounter: Payer: Self-pay | Admitting: Nurse Practitioner

## 2013-10-06 VITALS — BP 105/69 | HR 85 | Ht 71.0 in | Wt 228.0 lb

## 2013-10-06 DIAGNOSIS — G4733 Obstructive sleep apnea (adult) (pediatric): Secondary | ICD-10-CM

## 2013-10-06 DIAGNOSIS — G40209 Localization-related (focal) (partial) symptomatic epilepsy and epileptic syndromes with complex partial seizures, not intractable, without status epilepticus: Secondary | ICD-10-CM

## 2013-10-06 MED ORDER — LEVETIRACETAM 750 MG PO TABS: 1500.0000 mg | ORAL_TABLET | Freq: Two times a day (BID) | ORAL | Status: DC

## 2013-10-06 NOTE — Patient Instructions (Signed)
Memory score is stable Continue Keppra at current dose will refill Continue Vimpat at current dose does not need refills Call for seizure activity Followup in 6-8 months

## 2013-10-06 NOTE — Progress Notes (Signed)
[facility]  PATIENT: [name] [name] DOB: 1961-04-30   REASON FOR VISIT:follow up for seizure disorder, obstructive sleep apnea, mild memory loss   HISTORY OF PRESENT ILLNESS:Mr. 39, 53 year old male returns for followup. He was last seen in this office 08/12/2013 by Dr. Brett Fairy for his obstructive sleep apnea. He was last seen by me in  October of last year for his seizure disorder. When last seen he had a couple of seizures and his Vimpat was increased to 150 mg twice daily. He has had one seizure since then that was brief in the setting of passing a kidney stone. He continues to complain of mild memory loss. He claims his blood sugars are fairly well controled. He continues to complain of dizziness when changing positions. He returns for repeat evaluation. He  has a history of Crohn's disease.   HISTORY: of severe sleep apnea and is currently using CPAP. He also has a history of seizure disorder and his Keppra was increased to 1500 mg twice a day 07/28/2012 due to confusional episodes. On return visit in April he was continuing to have confusional episodes but they were better. He was placed on Vimpat starter pack and is currently on 148m BID. He has had 2 additional seizures since last seen in this office 12/31/2012. He did not call into the office to have medication changes He complains of dizziness when changing positions.      REVIEW OF SYSTEMS: Full 14 system review of systems performed and notable only for those listed, all others are neg:  Constitutional: N/A  Cardiovascular: N/A  Ear/Nose/Throat:  hearing loss  Skin: N/A  Eyes: N/A  Respiratory: N/A  Gastroitestinal: N/A  Hematology/Lymphatic: N/A  Endocrine: N/A Musculoskeletal: joint pain, difficulty walking  Allergy/Immunology: N/A  Neurological:  memory loss, dizziness seizure  Psychiatric: N/A   ALLERGIES: Allergies  Allergen Reactions  . Azithromycin Swelling  . Dilaudid [Hydromorphone  Hcl] Nausea And Vomiting  . Penicillins Hives  . Sulfonamide Derivatives Hives    HOME MEDICATIONS: Outpatient Prescriptions Prior to Visit  Medication Sig Dispense Refill  . aspirin 81 MG tablet Take 81 mg by mouth daily.      .Marland KitchenCALCIUM PO Take 1 tablet by mouth 3 (three) times daily.       . Cyanocobalamin (VITAMIN B 12 PO) Take 1 tablet by mouth as needed.       . diltiazem (DILTZAC) 180 MG 24 hr capsule Take 1 capsule (180 mg total) by mouth daily.  90 capsule  3  . eletriptan (RELPAX) 40 MG tablet Take 1 tablet at onset of headache, may repeat in 2 hours if headache persists or reoccurs.  12 tablet  4  . EPINEPHrine (EPIPEN) 0.3 mg/0.3 mL DEVI Inject 0.3 mLs (0.3 mg total) into the muscle once.  1 Device  1  . esomeprazole (NEXIUM) 40 MG capsule Take 40 mg by mouth 2 (two) times daily. Take 30- 60 min before your first and last meals of the day      . folic acid (FOLVITE) 1 MG tablet Take 2 tablets (2 mg total) by mouth daily.  180 tablet  3  . glimepiride (AMARYL) 2 MG tablet TAKE 1 TABLET DAILY BEFORE BREAKFAST  90 tablet  2  . glucosamine-chondroitin 500-400 MG tablet Take 2 tablets by mouth daily.      .Marland Kitchenglucose blood (CHOICE DM FORA G20 TEST STRIPS) test strip Use as instructed  100 each  12  . HYDROcodone-acetaminophen (NORCO/VICODIN) 5-325  MG per tablet Take 1 tablet by mouth every 6 (six) hours as needed for moderate pain.  15 tablet  0  . KLOR-CON M20 20 MEQ tablet TAKE 1 TABLET TWICE A DAY  180 tablet  2  . Lacosamide (VIMPAT) 150 MG TABS Take 1 tablet (150 mg total) by mouth 2 (two) times daily.  180 tablet  1  . Lactase (DAIRY-RELIEF PO) Take 3 tablets by mouth 3 (three) times daily.       Marland Kitchen levETIRAcetam (KEPPRA) 750 MG tablet TAKE 2 TABLETS (1500 MG TOTAL) TWICE A DAY  360 tablet  1  . mercaptopurine (PURINETHOL) 50 MG tablet Take 2 tablets (100 mg total) by mouth daily. Give on an empty stomach 1 hour before or 2 hours after meals. Caution: Chemotherapy.  180 tablet  0  .  Mesalamine (ASACOL HD) 800 MG TBEC Take 2 tablets (1,600 mg total) by mouth 3 (three) times daily.  540 tablet  3  . metFORMIN (GLUCOPHAGE) 500 MG tablet TAKE 2 TABLETS EVERY EVENING  180 tablet  2  . methscopolamine (PAMINE FORTE) 5 MG tablet TAKE 1 TABLET TWICE A DAY  180 tablet  0  . metoprolol tartrate (LOPRESSOR) 25 MG tablet Take 0.5 tablets (12.5 mg total) by mouth 2 (two) times daily.  90 tablet  3  . ONE TOUCH ULTRA TEST test strip USE AS INSTRUCTED  100 each  2  . ONETOUCH DELICA LANCETS 41L MISC USE AS INSTRUCTED (NEEDS OFFICE VISIT)  100 each  0  . ONETOUCH DELICA LANCETS 24M MISC 1 Act by Does not apply route 2 (two) times daily.  100 each  6  . saccharomyces boulardii (FLORASTOR) 250 MG capsule Take 250 mg by mouth daily.       . Sod Picosulfate-Mag Ox-Cit Acd 10-3.5-12 MG-GM-GM PACK Take 1 kit by mouth once.  1 each  0  . tamsulosin (FLOMAX) 0.4 MG CAPS Take 0.4 mg by mouth as needed.       . traMADol (ULTRAM) 50 MG tablet 1-2 every 4 hours as needed for cough or pain  40 tablet  0  . PRESCRIPTION MEDICATION Pt need refill on insulin strips       No facility-administered medications prior to visit.    PAST MEDICAL HISTORY: Past Medical History  Diagnosis Date  . Hypertension   . GERD (gastroesophageal reflux disease)   . Adenomatous colon polyp 10/1999  . Osteoporosis   . External hemorrhoids   . Crohn's disease of small and large intestines 1999  . Pneumonia   . Esophageal stricture   . MRSA (methicillin resistant Staphylococcus aureus) 10/2010   . Diabetes mellitus   . Hyperlipemia   . Cancer     skin  . Kidney stones   . Sleep apnea     CPAP machine  . PONV (postoperative nausea and vomiting)   . Complex partial seizure   . Migraines   . Seizures   . Cataract   . History of nuclear stress test 11/15/2010    exercise myoview; normal pattern of perfusion; normal, low risk study     PAST SURGICAL HISTORY: Past Surgical History  Procedure Laterality Date  .  Shoulder surgery    . Sinus surgery with instatrak    . Elbow surgery  2012    elbow MRSA infection   . Cataract extraction Bilateral   . Vasectomy    . Eye surgery    . Transthoracic echocardiogram  02/22/2011    EF  55-65%; increased pattern of LVH with mild conc hypertrophy, abnormal relaxation & increased filling pressure (grade 2 diastolic dysfunction); atrial septum thickened (lipomatous hypertrophy)    FAMILY HISTORY: Family History  Problem Relation Age of Onset  . Colon polyps Father   . Heart disease Father     HTN, aneursym  . Stroke Father   . Hyperlipidemia Father   . Hypertension Father   . Heart disease Mother     CABG at age 87  . Stroke Mother   . Hyperlipidemia Mother   . Hypertension Mother   . Colon cancer Neg Hx   . Stroke Paternal Grandmother   . Stroke Paternal Grandfather   . Other Child     tetrology of fallot (cornealia deland syndrome)    SOCIAL HISTORY: History   Social History  . Marital Status: Married    Spouse Name: [name]    Number of Children: 2  . Years of Education: college   Occupational History  . DATA BASE ADMINISTRATOR [employer]   Social History Main Topics  . Smoking status: Passive Smoke Exposure - Never Smoker  . Smokeless tobacco: Never Used  . Alcohol Use: No  . Drug Use: No  . Sexual Activity: Yes    Birth Control/ Protection: Surgical     Comment: 1 partner in last 12 months   Other Topics Concern  . Not on file   Social History Narrative   Patient is married [name]) and lives at home with his family.   Patient has two children.   Patient has a college education.   Patient is right-handed.   Patient drinks one cup of soda daily.     PHYSICAL EXAM  Filed Vitals:   10/06/13 0824  BP: 105/69  Pulse: 85  Height: 5' 11"  (1.803 m)  Weight: 228 lb (103.42 kg)   Body mass index is 31.81 kg/(m^2).  Generalized: Well developed,obese male in  no acute distress  Head: normocephalic and atraumatic,.  Oropharynx benign  Neck: Supple, no carotid bruits  Cardiac: Regular rate rhythm, no murmur  Musculoskeletal: No deformity   Neurological examination   Mentation: Alert oriented to time, place, history taking. MMSE 27/30. AFT 9. Follows all commands speech and language fluent  Cranial nerve II-XII: Pupils were equal round reactive to light, extraocular movements were full, visual field were full on confrontational test. Facial sensation and strength were normal. hearing was intact to finger rubbing bilaterally. Uvula tongue midline. head turning and shoulder shrug were normal and symmetric.Tongue protrusion into cheek strength was normal. Motor: normal bulk and tone, full strength in the BUE, BLE,  No focal weakness. Mild outstretched tremor bilaterally.  Sensory: normal and symmetric to light touch, pinprick, and  vibration  Coordination: finger-nose-finger, heel-to-shin bilaterally, no dysmetria Reflexes: Brachioradialis 2/2, biceps 2/2, triceps 2/2, patellar 2/2, Achilles 2/2, plantar responses were flexor bilaterally. Gait and Station: Rising up from seated position without assistance, normal stance,  moderate stride, good arm swing, smooth turning, able to perform tiptoe, and heel walking without difficulty. Tandem gait is  mildly unsteady   DIAGNOSTIC DATA (LABS, IMAGING, TESTING) - I reviewed patient records, labs, notes, testing and imaging myself where available.  Lab Results  Component Value Date   WBC 4.8 07/01/2013   HGB 12.1* 07/01/2013   HCT 34.8* 07/01/2013   MCV 101.2* 07/01/2013   PLT 162 07/01/2013      Component Value Date/Time   NA 137 07/01/2013 1001   K 3.9 07/01/2013 1001  CL 104 07/01/2013 1001   CO2 24 07/01/2013 1001   GLUCOSE 101* 07/01/2013 1001   BUN 13 07/01/2013 1001   CREATININE 0.71 07/01/2013 1001   CREATININE 0.72 01/04/2013 2155   CALCIUM 9.5 07/01/2013 1001   PROT 7.0 07/01/2013 1001   ALBUMIN 4.0 07/01/2013 1001   AST 29 07/01/2013 1001   ALT 20 07/01/2013 1001   ALKPHOS  121* 07/01/2013 1001   BILITOT 1.1 07/01/2013 1001   GFRNONAA >89 07/01/2013 1001   GFRNONAA >90 01/04/2013 2155   GFRAA >89 07/01/2013 1001   GFRAA >90 01/04/2013 2155   Lab Results  Component Value Date   CHOL 164 07/01/2013   HDL 41 07/01/2013   LDLCALC 74 07/01/2013   TRIG 245* 07/01/2013   CHOLHDL 4.0 07/01/2013   Lab Results  Component Value Date   HGBA1C 5.1 07/01/2013   Lab Results  Component Value Date   VITAMINB12 650 07/01/2013      ASSESSMENT AND PLAN  53 y.o. year old male  has a past medical history of Hypertension; GERD (gastroesophageal reflux disease); Crohn's disease of small and large intestines (1999);  Esophageal stricture; MRSA (methicillin resistant Staphylococcus aureus) (10/2010 ); Diabetes mellitus; Hyperlipemia;  Kidney stones; Sleep apnea; Complex partial seizure; Migraines; you to followup for seizure disorder and mild memory loss.MRI of the brain 09/05/13 was abnormal  showing mild age disproportionate supratentorial cortical atrophy. No significant change compared with CT head dated 01/05/2013.    Memory score is stable Continue Keppra at current dose will refill Continue Vimpat at current dose does not need refills Call for seizure activity Followup in 6-8 months Dennie Bible, [facility], [facility], [facility] [address], [place], [place] [address] [phone]

## 2013-10-12 ENCOUNTER — Other Ambulatory Visit: Payer: Self-pay | Admitting: Dermatology

## 2013-10-14 ENCOUNTER — Ambulatory Visit (AMBULATORY_SURGERY_CENTER): Payer: 59 | Admitting: Gastroenterology

## 2013-10-14 ENCOUNTER — Encounter: Payer: Self-pay | Admitting: Gastroenterology

## 2013-10-14 VITALS — BP 112/76 | HR 72 | Temp 98.6°F | Resp 27 | Ht 71.0 in | Wt 228.0 lb

## 2013-10-14 DIAGNOSIS — Z860100 Personal history of colon polyps, unspecified: Secondary | ICD-10-CM

## 2013-10-14 DIAGNOSIS — K5080 Crohn's disease of both small and large intestine without complications: Secondary | ICD-10-CM

## 2013-10-14 LAB — GLUCOSE, CAPILLARY
GLUCOSE-CAPILLARY: 102 mg/dL — AB (ref 70–99)
GLUCOSE-CAPILLARY: 58 mg/dL — AB (ref 70–99)
Glucose-Capillary: 127 mg/dL — ABNORMAL HIGH (ref 70–99)

## 2013-10-14 MED ORDER — ESOMEPRAZOLE MAGNESIUM 40 MG PO CPDR: 40.0000 mg | DELAYED_RELEASE_CAPSULE | Freq: Two times a day (BID) | ORAL | Status: DC

## 2013-10-14 MED ORDER — SODIUM CHLORIDE 0.9 % IV SOLN: 500.0000 mL | INTRAVENOUS | Status: DC

## 2013-10-14 MED ORDER — METHSCOPOLAMINE BROMIDE 5 MG PO TABS
5.0000 mg | ORAL_TABLET | Freq: Two times a day (BID) | ORAL | Status: DC
Start: 2013-10-14 — End: 2015-01-09

## 2013-10-14 MED ORDER — MESALAMINE 800 MG PO TBEC: 2.0000 | DELAYED_RELEASE_TABLET | Freq: Three times a day (TID) | ORAL | Status: DC

## 2013-10-14 MED ORDER — MERCAPTOPURINE 50 MG PO TABS: 100.0000 mg | ORAL_TABLET | Freq: Every day | ORAL | Status: DC

## 2013-10-14 NOTE — Patient Instructions (Signed)
YOU HAD AN ENDOSCOPIC PROCEDURE TODAY AT [facility]: Refer to the procedure report that was given to you for any specific questions about what was found during the examination.  If the procedure report does not answer your questions, please call your gastroenterologist to clarify.  If you requested that your care partner not be given the details of your procedure findings, then the procedure report has been included in a sealed envelope for you to review at your convenience later.  YOU SHOULD EXPECT: Some feelings of bloating in the abdomen. Passage of more gas than usual.  Walking can help get rid of the air that was put into your GI tract during the procedure and reduce the bloating. If you had a lower endoscopy (such as a colonoscopy or flexible sigmoidoscopy) you may notice spotting of blood in your stool or on the toilet paper. If you underwent a bowel prep for your procedure, then you may not have a normal bowel movement for a few days.  DIET: Your first meal following the procedure should be a light meal and then it is ok to progress to your normal diet.  A half-sandwich or bowl of soup is an example of a good first meal.  Heavy or fried foods are harder to digest and may make you feel nauseous or bloated.  Likewise meals heavy in dairy and vegetables can cause extra gas to form and this can also increase the bloating.  Drink plenty of fluids but you should avoid alcoholic beverages for 24 hours.  ACTIVITY: Your care partner should take you home directly after the procedure.  You should plan to take it easy, moving slowly for the rest of the day.  You can resume normal activity the day after the procedure however you should NOT DRIVE or use heavy machinery for 24 hours (because of the sedation medicines used during the test).    SYMPTOMS TO REPORT IMMEDIATELY: A gastroenterologist can be reached at any hour.  During normal business hours, 8:30 AM to 5:00 PM Monday through Friday,  call [phone].  After hours and on weekends, please call the GI answering service at [phone] who will take a message and have the physician on call contact you.   Following lower endoscopy (colonoscopy or flexible sigmoidoscopy):  Excessive amounts of blood in the stool  Significant tenderness or worsening of abdominal pains  Swelling of the abdomen that is new, acute  Fever of 100F or higher  FOLLOW UP: Our staff will call the home number listed on your records the next business day following your procedure to check on you and address any questions or concerns that you may have at that time regarding the information given to you following your procedure. This is a courtesy call and so if there is no answer at the home number and we have not heard from you through the emergency physician on call, we will assume that you have returned to your regular daily activities without incident.  SIGNATURES/CONFIDENTIALITY: You and/or your care partner have signed paperwork which will be entered into your electronic medical record.  These signatures attest to the fact that that the information above on your After Visit Summary has been reviewed and is understood.  Full responsibility of the confidentiality of this discharge information lies with you and/or your care-partner.  Your refills were sent to [facility]  Continue your normal medcations

## 2013-10-14 NOTE — Progress Notes (Signed)
[place]; pt states he does feel dizzy.  No diaphoresis noted  Orange juice, graham crackers, and peanut butter given.  D5 IV hung her A Willis RN  [place] now.  No c/o dizziness  Pt states, " I feel good now."

## 2013-10-14 NOTE — Progress Notes (Signed)
Poor prep, case ended, report to pacu rn, vss, bbs=clear

## 2013-10-14 NOTE — Op Note (Addendum)
[place]  [address]. [place], [address]   COLONOSCOPY PROCEDURE REPORT  PATIENT: [name], [name]  MR#: [id number] BIRTHDATE: May 19, 1961 , 52  yrs. old GENDER: Male ENDOSCOPIST: Ladene Artist, MD, [facility] PROCEDURE DATE:  10/14/2013 PROCEDURE:   Colonoscopy, incomplete First Screening Colonoscopy - Avg.  risk and is 50 yrs.  old or older - No.  Prior Negative Screening - Now for repeat screening. N/A  History of Adenoma - Now for follow-up colonoscopy & has been > or = to 3 yrs.  Yes hx of adenoma.  Has been 3 or more years since last colonoscopy.  Polyps Removed Today? No.  Recommend repeat exam, <10 yrs? Yes.  Inadequate prep. ASA CLASS:   Class III INDICATIONS:Patient's personal history of adenomatous colon polyps and High risk patient with previously diagnosed Crohn's disease: large & small intestine. MEDICATIONS: MAC sedation, administered by CRNA and propofol (Diprivan) 125m IV DESCRIPTION OF PROCEDURE:   After the risks benefits and alternatives of the procedure were thoroughly explained, informed consent was obtained.  A digital rectal exam revealed no abnormalities of the rectum.   The LB [id number] endoscope was introduced through the anus and advanced to the sigmoid colon. No adverse events experienced.   Limited by poor preparation. Exam aborted in sigmoid colon due solid stool.  The quality of the prep was Prepopik poor  The instrument was then slowly withdrawn as the colon was fully examined.    COLON FINDINGS: The visualized portions of the colonic mucosa appeared normal in the sigmoid colon and rectum. Solid stool severely limited the exam. Retroflexed views revealed moderate internal hemorrhoids. The time to cecum=  .  Withdrawal time=  . The scope was withdrawn and the procedure completed.  COMPLICATIONS: There were no complications.  ENDOSCOPIC IMPRESSION: 1.  Incomplete colonoscopy due to a poor bowel prep.   Visualized portions of the sigmoid colon and rectum were normal. 2.  Moderate internal hemorrhoids  RECOMMENDATIONS: 1.  Reschedule colonoscopy with 2 day bowel prep  eSigned:  MLadene Artist MD, [facility]04/23/2015 10:05 AM Revised: 10/14/2013 10:05 AM

## 2013-10-15 ENCOUNTER — Telehealth: Payer: Self-pay | Admitting: *Deleted

## 2013-10-15 NOTE — Telephone Encounter (Signed)
  Follow up Call-  Call back number 10/14/2013  Post procedure Call Back phone  # [phone]  Permission to leave phone message Yes     Patient questions:  Do you have a fever, pain , or abdominal swelling? no Pain Score  0 *  Have you tolerated food without any problems? yes  Have you been able to return to your normal activities? yes  Do you have any questions about your discharge instructions: Diet   no Medications  no Follow up visit  no  Do you have questions or concerns about your Care? no  Actions: * If pain score is 4 or above: No action needed, pain <4.

## 2013-11-24 ENCOUNTER — Ambulatory Visit (AMBULATORY_SURGERY_CENTER): Payer: Self-pay | Admitting: *Deleted

## 2013-11-24 VITALS — Ht 71.0 in | Wt 223.4 lb

## 2013-11-24 DIAGNOSIS — Z86010 Personal history of colonic polyps: Secondary | ICD-10-CM

## 2013-11-24 NOTE — Progress Notes (Signed)
No allergies to eggs or soy. No problems with anesthesia.  Pt not given Emmi instructions for colonoscopy; has already seen video  No oxygen use  No diet drug use

## 2013-11-27 ENCOUNTER — Other Ambulatory Visit: Payer: Self-pay | Admitting: Family Medicine

## 2013-12-01 ENCOUNTER — Ambulatory Visit (AMBULATORY_SURGERY_CENTER): Payer: 59 | Admitting: Gastroenterology

## 2013-12-01 ENCOUNTER — Encounter: Payer: Self-pay | Admitting: Gastroenterology

## 2013-12-01 VITALS — BP 109/65 | HR 67 | Temp 97.7°F | Resp 18 | Ht 71.0 in | Wt 223.0 lb

## 2013-12-01 DIAGNOSIS — K5080 Crohn's disease of both small and large intestine without complications: Secondary | ICD-10-CM

## 2013-12-01 DIAGNOSIS — D126 Benign neoplasm of colon, unspecified: Secondary | ICD-10-CM

## 2013-12-01 DIAGNOSIS — Z86010 Personal history of colonic polyps: Secondary | ICD-10-CM

## 2013-12-01 LAB — GLUCOSE, CAPILLARY
GLUCOSE-CAPILLARY: 98 mg/dL (ref 70–99)
Glucose-Capillary: 118 mg/dL — ABNORMAL HIGH (ref 70–99)

## 2013-12-01 MED ORDER — SODIUM CHLORIDE 0.9 % IV SOLN: 500.0000 mL | INTRAVENOUS | Status: DC

## 2013-12-01 NOTE — Progress Notes (Signed)
A/ox3 pleased with MAC, report to Annette RN 

## 2013-12-01 NOTE — Op Note (Signed)
[place]  [address]. [place], [address]   COLONOSCOPY PROCEDURE REPORT  PATIENT: [name] [name], [name] [name]  MR#: [id number] BIRTHDATE: 11/26/1960 , 52  yrs. old GENDER: Male ENDOSCOPIST: Ladene Artist, MD, [facility] PROCEDURE DATE:  12/01/2013 PROCEDURE:   Colonoscopy with biopsy and snare polypectomy First Screening Colonoscopy - Avg.  risk and is 50 yrs.  old or older - No.  Prior Negative Screening - Now for repeat screening. N/A  History of Adenoma - Now for follow-up colonoscopy & has been > or = to 3 yrs.  Yes hx of adenoma.  Has been 3 or more years since last colonoscopy.  Polyps Removed Today? Yes. ASA CLASS:   Class III INDICATIONS:Patient's personal history of adenomatous colon polyps and High risk patient with previously diagnosed Crohn's disease 8+ years duration. MEDICATIONS: MAC sedation, administered by CRNA and propofol (Diprivan) 232m IV DESCRIPTION OF PROCEDURE:   After the risks benefits and alternatives of the procedure were thoroughly explained, informed consent was obtained.  A digital rectal exam revealed no abnormalities of the rectum.   The LB [id number] endoscope was introduced through the anus and advanced to the cecum, which was identified by both the appendix and ileocecal valve. No adverse events experienced.   The quality of the prep was good, using MoviPrep  The instrument was then slowly withdrawn as the colon was fully examined.  COLON FINDINGS: Three sessile polyps measuring 6-7 mm in size were found in the transverse colon, descending colon, and sigmoid colon. A polypectomy was performed with a cold snare.  The resection was complete and the polyp tissue was completely retrieved.   Mild diverticulosis was noted in the sigmoid colon.  The colon was otherwise normal.  Random biopsies were taken throughout the colon. There was no diverticulosis, inflammation, polyps or cancers unless previously stated.  Retroflexed views  revealed small internal hemorrhoids. The time to cecum=3 minutes 20 seconds.  Withdrawal time=13 minutes 12 seconds.  The scope was withdrawn and the procedure completed. COMPLICATIONS: There were no complications.  ENDOSCOPIC IMPRESSION: 1.   Three sessile polyps measuring 6-7 mm in the transverse, descending, and sigmoid colon; polypectomy performed with a cold snare 2.   Mild diverticulosis in the sigmoid colon 3.   Small internal hemorrhoids  RECOMMENDATIONS: 1.  Await pathology results 2.  Repeat Colonoscopy in 3 years.  eSigned:  MLadene Artist MD, [facility]06/03/2014 9:49 AM

## 2013-12-01 NOTE — Progress Notes (Signed)
No complaints noted in the recovery room. Maw

## 2013-12-01 NOTE — Progress Notes (Signed)
Called to room to assist during endoscopic procedure.  Patient ID and intended procedure confirmed with present staff. Received instructions for my participation in the procedure from the performing physician.  

## 2013-12-01 NOTE — Patient Instructions (Signed)
YOU HAD AN ENDOSCOPIC PROCEDURE TODAY AT [facility]: Refer to the procedure report that was given to you for any specific questions about what was found during the examination.  If the procedure report does not answer your questions, please call your gastroenterologist to clarify.  If you requested that your care partner not be given the details of your procedure findings, then the procedure report has been included in a sealed envelope for you to review at your convenience later.  YOU SHOULD EXPECT: Some feelings of bloating in the abdomen. Passage of more gas than usual.  Walking can help get rid of the air that was put into your GI tract during the procedure and reduce the bloating. If you had a lower endoscopy (such as a colonoscopy or flexible sigmoidoscopy) you may notice spotting of blood in your stool or on the toilet paper. If you underwent a bowel prep for your procedure, then you may not have a normal bowel movement for a few days.  DIET: Your first meal following the procedure should be a light meal and then it is ok to progress to your normal diet.  A half-sandwich or bowl of soup is an example of a good first meal.  Heavy or fried foods are harder to digest and may make you feel nauseous or bloated.  Likewise meals heavy in dairy and vegetables can cause extra gas to form and this can also increase the bloating.  Drink plenty of fluids but you should avoid alcoholic beverages for 24 hours.  ACTIVITY: Your care partner should take you home directly after the procedure.  You should plan to take it easy, moving slowly for the rest of the day.  You can resume normal activity the day after the procedure however you should NOT DRIVE or use heavy machinery for 24 hours (because of the sedation medicines used during the test).    SYMPTOMS TO REPORT IMMEDIATELY: A gastroenterologist can be reached at any hour.  During normal business hours, 8:30 AM to 5:00 PM Monday through Friday,  call [phone].  After hours and on weekends, please call the GI answering service at [phone] who will take a message and have the physician on call contact you.   Following lower endoscopy (colonoscopy or flexible sigmoidoscopy):  Excessive amounts of blood in the stool  Significant tenderness or worsening of abdominal pains  Swelling of the abdomen that is new, acute  Fever of 100F or higher   FOLLOW UP: If any biopsies were taken you will be contacted by phone or by letter within the next 1-3 weeks.  Call your gastroenterologist if you have not heard about the biopsies in 3 weeks.  Our staff will call the home number listed on your records the next business day following your procedure to check on you and address any questions or concerns that you may have at that time regarding the information given to you following your procedure. This is a courtesy call and so if there is no answer at the home number and we have not heard from you through the emergency physician on call, we will assume that you have returned to your regular daily activities without incident.  SIGNATURES/CONFIDENTIALITY: You and/or your care partner have signed paperwork which will be entered into your electronic medical record.  These signatures attest to the fact that that the information above on your After Visit Summary has been reviewed and is understood.  Full responsibility of the confidentiality of  this discharge information lies with you and/or your care-partner.    Handouts were given to your care partner on polyps, diverticulosis, a high fiber diet with liberal fluid intake, hemorrhoids and crohn's disease. You may resume your current medications today. Await biopsy results. Please call if any questions or concerns. Blood sugar was 98 in the recovery room.

## 2013-12-02 ENCOUNTER — Telehealth: Payer: Self-pay | Admitting: *Deleted

## 2013-12-02 NOTE — Telephone Encounter (Signed)
  Follow up Call-  Call back number 12/01/2013 10/14/2013  Post procedure Call Back phone  # [phone]  Permission to leave phone message Yes Yes     Patient questions:  Message left to call us if necessary.

## 2013-12-07 ENCOUNTER — Encounter: Payer: Self-pay | Admitting: Gastroenterology

## 2013-12-12 ENCOUNTER — Ambulatory Visit (INDEPENDENT_AMBULATORY_CARE_PROVIDER_SITE_OTHER): Payer: 59 | Admitting: Family Medicine

## 2013-12-12 VITALS — BP 98/68 | HR 83 | Temp 97.9°F | Resp 16 | Ht 71.0 in | Wt 218.0 lb

## 2013-12-12 DIAGNOSIS — R21 Rash and other nonspecific skin eruption: Secondary | ICD-10-CM

## 2013-12-12 MED ORDER — CLINDAMYCIN PHOSPHATE 1 % EX GEL: Freq: Two times a day (BID) | CUTANEOUS | Status: DC

## 2013-12-12 NOTE — Progress Notes (Signed)
Chief Complaint:  Chief Complaint  Patient presents with  . Spot on left arm    getting worse; possible MRSA?    HPI: [name] [name] is a 53 y.o. male who is here for  A lesion on his left arm near elbow for 2 months, worried about redness around lesion He has it for a while, it scabs over and he says there is more redness and some irritation. HE is worried it will be a full blown infection. He has tried ot meds HE has had MRSA in the past in his elbow. He has many medical problems including Crohn's disease, seizures and also many drug allergies, he has taken doxycyclin before and had more seizures when he is on it. He has sulfa allergies so cannot take bactrim or bactroban HE doe snot think he has a true allergy to z pack but does have some sensitivity to it He has had C diff before.   Past Medical History  Diagnosis Date  . Hypertension   . GERD (gastroesophageal reflux disease)   . Adenomatous colon polyp 10/1999  . Osteoporosis   . External hemorrhoids   . Crohn's disease of small and large intestines 1999  . Pneumonia   . Esophageal stricture   . MRSA (methicillin resistant Staphylococcus aureus) 10/2010   . Diabetes mellitus   . Hyperlipemia   . Cancer     skin  . Kidney stones   . Sleep apnea     CPAP machine  . PONV (postoperative nausea and vomiting)   . Complex partial seizure   . Migraines   . Seizures   . Cataract   . History of nuclear stress test 11/15/2010    exercise myoview; normal pattern of perfusion; normal, low risk study    Past Surgical History  Procedure Laterality Date  . Shoulder surgery    . Sinus surgery with instatrak    . Elbow surgery  2012    elbow MRSA infection   . Cataract extraction Bilateral   . Vasectomy    . Eye surgery    . Transthoracic echocardiogram  02/22/2011    EF 55-65%; increased pattern of LVH with mild conc hypertrophy, abnormal relaxation & increased filling pressure (grade 2 diastolic dysfunction); atrial  septum thickened (lipomatous hypertrophy)   History   Social History  . Marital Status: Married    Spouse Name: [name] [name]    Number of Children: 2  . Years of Education: college   Occupational History  . DATA BASE ADMINISTRATOR [employer]   Social History Main Topics  . Smoking status: Never Smoker   . Smokeless tobacco: Never Used  . Alcohol Use: No  . Drug Use: No  . Sexual Activity: Yes    Birth Control/ Protection: Surgical     Comment: 1 partner in last 12 months   Other Topics Concern  . Not on file   Social History Narrative   Patient is married [name] [name]) and lives at home with his family.   Patient has two children.   Patient has a college education.   Patient is right-handed.   Patient drinks one cup of soda daily.   Family History  Problem Relation Age of Onset  . Colon polyps Father   . Heart disease Father     HTN, aneursym  . Stroke Father   . Hyperlipidemia Father   . Hypertension Father   . Heart disease Mother     CABG at age 54  .  Stroke Mother   . Hyperlipidemia Mother   . Hypertension Mother   . Colon cancer Neg Hx   . Stroke Paternal Grandmother   . Stroke Paternal Grandfather   . Other Child     tetrology of fallot (cornealia deland syndrome)   Allergies  Allergen Reactions  . Azithromycin Swelling  . Dilaudid [Hydromorphone Hcl] Nausea And Vomiting  . Penicillins Hives  . Sulfonamide Derivatives Hives   Prior to Admission medications   Medication Sig Start Date End Date Taking? Authorizing Provider  aspirin 81 MG tablet Take 81 mg by mouth daily.   Yes Historical Provider, MD  CALCIUM PO Take 1 tablet by mouth 3 (three) times daily.    Yes Historical Provider, MD  Cyanocobalamin (VITAMIN B 12 PO) Take 1 tablet by mouth as needed.    Yes Historical Provider, MD  diltiazem (DILTZAC) 180 MG 24 hr capsule Take 1 capsule (180 mg total) by mouth daily. 05/03/13  Yes Pixie Casino, MD  eletriptan (RELPAX) 40 MG tablet Take 1 tablet at onset  of headache, may repeat in 2 hours if headache persists or reoccurs. 01/18/13  Yes Wendie Agreste, MD  EPINEPHrine (EPIPEN) 0.3 mg/0.3 mL DEVI Inject 0.3 mLs (0.3 mg total) into the muscle once. 02/26/12  Yes Wardell Honour, MD  esomeprazole (NEXIUM) 40 MG capsule Take 1 capsule (40 mg total) by mouth 2 (two) times daily. Take 30- 60 min before your first and last meals of the day 10/14/13  Yes Ladene Artist, MD  folic acid (FOLVITE) 1 MG tablet Take 2 tablets (2 mg total) by mouth daily. 12/22/12  Yes [name], MD  glimepiride (AMARYL) 2 MG tablet TAKE 1 TABLET DAILY BEFORE BREAKFAST   Yes Mancel Bale, PA-C  glucosamine-chondroitin 500-400 MG tablet Take 2 tablets by mouth daily.   Yes Historical Provider, MD  HYDROcodone-acetaminophen (NORCO/VICODIN) 5-325 MG per tablet Take 1 tablet by mouth every 6 (six) hours as needed for moderate pain. 08/23/13  Yes Ryan M Dunn, PA-C  KLOR-CON M20 20 MEQ tablet TAKE 1 TABLET TWICE A DAY   Yes Mancel Bale, PA-C  Lacosamide (VIMPAT) 150 MG TABS Take 1 tablet (150 mg total) by mouth 2 (two) times daily. 09/28/13  Yes Carmen Dohmeier, MD  Lactase (DAIRY-RELIEF PO) Take 3 tablets by mouth 3 (three) times daily.    Yes Historical Provider, MD  levETIRAcetam (KEPPRA) 750 MG tablet Take 2 tablets (1,500 mg total) by mouth 2 (two) times daily. 10/06/13  Yes Dennie Bible, NP  mercaptopurine (PURINETHOL) 50 MG tablet Take 2 tablets (100 mg total) by mouth daily. Give on an empty stomach 1 hour before or 2 hours after meals. Caution: Chemotherapy. 10/14/13  Yes Ladene Artist, MD  Mesalamine (ASACOL HD) 800 MG TBEC Take 2 tablets (1,600 mg total) by mouth 3 (three) times daily. 10/14/13  Yes Ladene Artist, MD  metFORMIN (GLUCOPHAGE) 500 MG tablet TAKE 2 TABLETS EVERY EVENING   Yes Mancel Bale, PA-C  methscopolamine (PAMINE FORTE) 5 MG tablet Take 1 tablet (5 mg total) by mouth 2 (two) times daily. 10/14/13  Yes Ladene Artist, MD  metoprolol tartrate  (LOPRESSOR) 25 MG tablet Take 0.5 tablets (12.5 mg total) by mouth 2 (two) times daily. 05/03/13  Yes Pixie Casino, MD  ONE [facility] ULTRA TEST test strip USE AS INSTRUCTED   Yes Mancel Bale, PA-C  ONETOUCH DELICA LANCETS 60V MISC USE AS INSTRUCTED (NEEDS OFFICE VISIT)  02/04/13  Yes Ryan M Dunn, PA-C  ONETOUCH DELICA LANCETS 29H MISC 1 Act by Does not apply route 2 (two) times daily. 07/01/13  Yes [name], MD  saccharomyces boulardii (FLORASTOR) 250 MG capsule Take 250 mg by mouth daily.    Yes Historical Provider, MD  tamsulosin (FLOMAX) 0.4 MG CAPS Take 0.4 mg by mouth as needed.    Yes Historical Provider, MD  traMADol (ULTRAM) 50 MG tablet 1-2 every 4 hours as needed for cough or pain 01/12/13  Yes Tanda Rockers, MD  glucose blood (CHOICE DM FORA G20 TEST STRIPS) test strip Use as instructed 11/13/12 11/13/13  [name], MD     ROS: The patient denies fevers, chills, night sweats, unintentional weight loss, chest pain, palpitations, wheezing, dyspnea on exertion, nausea, vomiting, abdominal pain, dysuria, hematuria, melena, numbness, weakness, or tingling.  All other systems have been reviewed and were otherwise negative with the exception of those mentioned in the HPI and as above.    PHYSICAL EXAM: Filed Vitals:   12/12/13 1227  BP: 98/68  Pulse: 83  Temp: 97.9 F (36.6 C)  Resp: 16   Filed Vitals:   12/12/13 1227  Height: 5' 11"  (1.803 m)  Weight: 218 lb (98.884 kg)   Body mass index is 30.42 kg/(m^2).  General: Alert, no acute distress HEENT:  Normocephalic, atraumatic, oropharynx patent. EOMI, PERRLA Cardiovascular:  Regular rate and rhythm, no rubs murmurs or gallops.  No Carotid bruits, radial pulse intact. No pedal edema.  Respiratory: Clear to auscultation bilaterally.  No wheezes, rales, or rhonchi.  No cyanosis, no use of accessory musculature GI: No organomegaly, abdomen is soft and non-tender, positive bowel sounds.  No masses. Skin: + Small  nondraiaing ppaular area withouth any fluctuance. 1/4 by 1/4 inch. Neurologic: Facial musculature symmetric. Psychiatric: Patient is appropriate throughout our interaction. Lymphatic: No cervical lymphadenopathy Musculoskeletal: Gait intact.   LABS: Results for orders placed in visit on 12/01/13  GLUCOSE, CAPILLARY      Result Value Ref Range   Glucose-Capillary 118 (*) 70 - 99 mg/dL  GLUCOSE, CAPILLARY      Result Value Ref Range   Glucose-Capillary 98  70 - 99 mg/dL   Comment 1 Notify RN       EKG/XRAY:   Primary read interpreted by Dr. Marin Comment at [facility].   ASSESSMENT/PLAN: Encounter Diagnosis  Name Primary?  . Rash and nonspecific skin eruption Yes   53 y/o male with crohns disease, seizures, h/o C diff, h/o MRSA and multiple drug allergies who presents for a papular rash that has minimal risk for infection, there is 1 lesion with e/o i rritation and recurrent scabbing and some redness around the border.  He is worried about MRSA  He would like to be treated for it, we cannot give him bactroban due to sulfa allergy He has increase seizures with doxy but he states he would rather risk that then get MRSA Since the papule is so small and i think it has been there for 2 months without worsening sxs I will jsut give him cleocin gel and see how he does. Clindamycin has MRSA coverage so I am hoping this will alleviated patient's anxiety and also helps heal the area. If this does not work then will ask dermatology He cannot get into see derm until August.  F.u prn   Gross sideeffects, risk and benefits, and alternatives of medications d/w patient. Patient is aware that all medications have potential sideeffects and we are unable  to predict every sideeffect or drug-drug interaction that may occur.  LE, [place], DO 12/12/2013 2:55 PM

## 2014-01-12 ENCOUNTER — Other Ambulatory Visit: Payer: Self-pay | Admitting: Family Medicine

## 2014-01-12 DIAGNOSIS — R7989 Other specified abnormal findings of blood chemistry: Secondary | ICD-10-CM

## 2014-01-12 DIAGNOSIS — E7211 Homocystinuria: Principal | ICD-10-CM

## 2014-01-12 MED ORDER — FOLIC ACID 1 MG PO TABS: 2.0000 mg | ORAL_TABLET | Freq: Every day | ORAL | Status: DC

## 2014-01-13 NOTE — Progress Notes (Signed)
Notified pt on VM that folic acid was sent in for him.

## 2014-02-16 ENCOUNTER — Ambulatory Visit (INDEPENDENT_AMBULATORY_CARE_PROVIDER_SITE_OTHER): Payer: 59 | Admitting: Family Medicine

## 2014-02-16 VITALS — BP 118/66 | HR 80 | Temp 97.9°F | Resp 18 | Ht 71.0 in | Wt 221.0 lb

## 2014-02-16 DIAGNOSIS — L03119 Cellulitis of unspecified part of limb: Principal | ICD-10-CM

## 2014-02-16 DIAGNOSIS — L02419 Cutaneous abscess of limb, unspecified: Secondary | ICD-10-CM

## 2014-02-16 DIAGNOSIS — IMO0002 Reserved for concepts with insufficient information to code with codable children: Secondary | ICD-10-CM

## 2014-02-16 DIAGNOSIS — L02412 Cutaneous abscess of left axilla: Secondary | ICD-10-CM

## 2014-02-16 MED ORDER — DOXYCYCLINE HYCLATE 100 MG PO CAPS: 100.0000 mg | ORAL_CAPSULE | Freq: Two times a day (BID) | ORAL | Status: DC

## 2014-02-16 NOTE — Patient Instructions (Signed)
Start the doxycycline twice a day- I gave you 10 days worth, but 5-7 days maybe enough.  Keep your areas covered for 24 hours, then you can shower and rebandage as needed. The one on your thigh does have some packing- this can be removed at home tomorrow if you wish, or we are glad to see you back for a recheck.  If you still have significant pus drainage come back and we will repack this for you.    Let us know if you are not getting better soon!

## 2014-02-16 NOTE — Progress Notes (Signed)
[facility] [address] [place] [address] [phone]  Date:  02/16/2014   Name:  [name] [name]   DOB:  03/11/1961   MRN:  [id number]  PCP:  [name], MD    Chief Complaint: Recurrent Skin Infections   History of Present Illness:  [name] [name] is a 53 y.o. very pleasant male patient who presents with the following:  He noted a boil on his left inner thigh yesterday it is painful but has not drained.  He did have a similar thing on his left axilla earlier this week.  It seems better now, but still seems as through it needs to drain.   He has had MRSA in the past.   He has a seizure disorder and had a seizure yesterday.  He did not get hurt- he has complex partial seizures and does not have LOC   He may have seizures 2x a month- his neurologist is Dr. Krista Blue at [facility].  He is seen there regularly by the PA Cecille Rubin.    Patient Active Problem List   Diagnosis Date Noted  . Seizure 04/04/2013  . Chest pain 03/12/2013  . Abnormal EKG 03/12/2013  . DOE (dyspnea on exertion) 03/12/2013  . Obstructive sleep apnea 10/01/2012  . Complex partial seizure 04/19/2012  . DM2 (diabetes mellitus, type 2) 09/25/2011  . Altered mental status 09/25/2011  . OTHER DYSPHAGIA 03/27/2009  . TRANSAMINASES, SERUM, ELEVATED 03/27/2009  . Cough 11/10/2008  . GERD 12/07/2007  . COLONIC POLYPS, ADENOMATOUS, HX OF 12/07/2007  . EXTERNAL HEMORRHOIDS 09/11/2007  . CROHN'S DISEASE, LARGE AND SMALL INTESTINES 09/11/2007  . OSTEOPOROSIS 09/11/2007  . HYPERLIPIDEMIA NEC/NOS 02/27/2007  . HYPERTENSION, ESSENTIAL NOS 02/27/2007    Past Medical History  Diagnosis Date  . Hypertension   . GERD (gastroesophageal reflux disease)   . Adenomatous colon polyp 10/1999  . Osteoporosis   . External hemorrhoids   . Crohn's disease of small and large intestines 1999  . Pneumonia   . Esophageal stricture   . MRSA (methicillin resistant Staphylococcus aureus) 10/2010   .  Diabetes mellitus   . Hyperlipemia   . Cancer     skin  . Kidney stones   . Sleep apnea     CPAP machine  . PONV (postoperative nausea and vomiting)   . Complex partial seizure   . Migraines   . Seizures   . Cataract   . History of nuclear stress test 11/15/2010    exercise myoview; normal pattern of perfusion; normal, low risk study     Past Surgical History  Procedure Laterality Date  . Shoulder surgery    . Sinus surgery with instatrak    . Elbow surgery  2012    elbow MRSA infection   . Cataract extraction Bilateral   . Vasectomy    . Eye surgery    . Transthoracic echocardiogram  02/22/2011    EF 55-65%; increased pattern of LVH with mild conc hypertrophy, abnormal relaxation & increased filling pressure (grade 2 diastolic dysfunction); atrial septum thickened (lipomatous hypertrophy)    History  Substance Use Topics  . Smoking status: Never Smoker   . Smokeless tobacco: Never Used  . Alcohol Use: No    Family History  Problem Relation Age of Onset  . Colon polyps Father   . Heart disease Father     HTN, aneursym  . Stroke Father   . Hyperlipidemia Father   . Hypertension Father   . Heart disease  Mother     CABG at age 44  . Stroke Mother   . Hyperlipidemia Mother   . Hypertension Mother   . Colon cancer Neg Hx   . Stroke Paternal Grandmother   . Stroke Paternal Grandfather   . Other Child     tetrology of fallot (cornealia deland syndrome)    Allergies  Allergen Reactions  . Azithromycin Swelling  . Dilaudid [Hydromorphone Hcl] Nausea And Vomiting  . Penicillins Hives  . Sulfonamide Derivatives Hives    Medication list has been reviewed and updated.  Current Outpatient Prescriptions on File Prior to Visit  Medication Sig Dispense Refill  . aspirin 81 MG tablet Take 81 mg by mouth daily.      Marland Kitchen CALCIUM PO Take 1 tablet by mouth 3 (three) times daily.       . clindamycin (CLINDAGEL) 1 % gel Apply topically 2 (two) times daily.  30 g  0  .  Cyanocobalamin (VITAMIN B 12 PO) Take 1 tablet by mouth as needed.       . diltiazem (DILTZAC) 180 MG 24 hr capsule Take 1 capsule (180 mg total) by mouth daily.  90 capsule  3  . eletriptan (RELPAX) 40 MG tablet Take 1 tablet at onset of headache, may repeat in 2 hours if headache persists or reoccurs.  12 tablet  4  . esomeprazole (NEXIUM) 40 MG capsule Take 1 capsule (40 mg total) by mouth 2 (two) times daily. Take 30- 60 min before your first and last meals of the day  180 capsule  3  . folic acid (FOLVITE) 1 MG tablet Take 2 tablets (2 mg total) by mouth daily.  180 tablet  3  . glimepiride (AMARYL) 2 MG tablet TAKE 1 TABLET DAILY BEFORE BREAKFAST  90 tablet  2  . glucose blood (CHOICE DM FORA G20 TEST STRIPS) test strip Use as instructed  100 each  12  . HYDROcodone-acetaminophen (NORCO/VICODIN) 5-325 MG per tablet Take 1 tablet by mouth every 6 (six) hours as needed for moderate pain.  15 tablet  0  . KLOR-CON M20 20 MEQ tablet TAKE 1 TABLET TWICE A DAY  180 tablet  2  . Lacosamide (VIMPAT) 150 MG TABS Take 1 tablet (150 mg total) by mouth 2 (two) times daily.  180 tablet  1  . Lactase (DAIRY-RELIEF PO) Take 3 tablets by mouth 3 (three) times daily.       Marland Kitchen levETIRAcetam (KEPPRA) 750 MG tablet Take 2 tablets (1,500 mg total) by mouth 2 (two) times daily.  360 tablet  1  . mercaptopurine (PURINETHOL) 50 MG tablet Take 2 tablets (100 mg total) by mouth daily. Give on an empty stomach 1 hour before or 2 hours after meals. Caution: Chemotherapy.  180 tablet  3  . Mesalamine (ASACOL HD) 800 MG TBEC Take 2 tablets (1,600 mg total) by mouth 3 (three) times daily.  540 tablet  3  . metFORMIN (GLUCOPHAGE) 500 MG tablet TAKE 2 TABLETS EVERY EVENING  180 tablet  2  . methscopolamine (PAMINE FORTE) 5 MG tablet Take 1 tablet (5 mg total) by mouth 2 (two) times daily.  180 tablet  3  . metoprolol tartrate (LOPRESSOR) 25 MG tablet Take 0.5 tablets (12.5 mg total) by mouth 2 (two) times daily.  90 tablet  3   . ONE TOUCH ULTRA TEST test strip USE AS INSTRUCTED  100 each  2  . ONETOUCH DELICA LANCETS 01S MISC USE AS INSTRUCTED (NEEDS OFFICE VISIT)  100 each  0  . saccharomyces boulardii (FLORASTOR) 250 MG capsule Take 250 mg by mouth daily.       . tamsulosin (FLOMAX) 0.4 MG CAPS Take 0.4 mg by mouth as needed.       . traMADol (ULTRAM) 50 MG tablet 1-2 every 4 hours as needed for cough or pain  40 tablet  0  . EPINEPHrine (EPIPEN) 0.3 mg/0.3 mL DEVI Inject 0.3 mLs (0.3 mg total) into the muscle once.  1 Device  1  . glucosamine-chondroitin 500-400 MG tablet Take 2 tablets by mouth daily.       No current facility-administered medications on file prior to visit.    Review of Systems:  As per HPI- otherwise negative.   Physical Examination: Filed Vitals:   02/16/14 0814  BP: 118/66  Pulse: 80  Temp: 97.9 F (36.6 C)  Resp: 18   Filed Vitals:   02/16/14 0814  Height: 5' 11"  (1.803 m)  Weight: 221 lb (100.245 kg)   Body mass index is 30.84 kg/(m^2). Ideal Body Weight: Weight in (lb) to have BMI = 25: 178.9  GEN: WDWN, NAD, Non-toxic, A & O x 3 HEENT: Atraumatic, Normocephalic. Neck supple. No masses, No LAD. Ears and Nose: No external deformity. CV: RRR, No M/G/R. No JVD. No thrill. No extra heart sounds. PULM: CTA B, no wheezes, crackles, rhonchi. No retractions. No resp. distress. No accessory muscle use. ABD: S, NT, ND, +BS. No rebound. No HSM. EXTR: No c/c/e NEURO Normal gait.  PSYCH: Normally interactive. Conversant. Not depressed or anxious appearing.  Calm demeanor.  Left axilla: there is a small (just larger than a pea) abscess/ infected sebaceous cyst that is draining some material, still tender.   Left inner thigh: there is a marble sized superficial abscess that clearly contains pus.  It is tender  Procedure: VC obtained.  Each lesions was anesthestized with a small amount of 1% lidocaine, I and D with 11 blade. The axilla produced a small amount of sebaceous  material.  Not packed but dressed The thigh abscess produced pus, small superficial cavity packed with 1/4 packing and dressed  Assessment and Plan: Cellulitis and abscess of leg - Plan: doxycycline (VIBRAMYCIN) 100 MG capsule  Abscess of axilla, left - Plan: doxycycline (VIBRAMYCIN) 100 MG capsule  S/p I and D x2 as above.  Suspect the thigh will not need to be repacked- his wife is able to remove packing at home tomorrow but they will come back in if needed.   See patient instructions for more details.     Signed Lamar Blinks, MD

## 2014-03-23 ENCOUNTER — Other Ambulatory Visit: Payer: Self-pay

## 2014-03-25 MED ORDER — LEVETIRACETAM 750 MG PO TABS: 1500.0000 mg | ORAL_TABLET | Freq: Two times a day (BID) | ORAL | Status: DC

## 2014-03-25 MED ORDER — LACOSAMIDE 150 MG PO TABS: 150.0000 mg | ORAL_TABLET | Freq: Two times a day (BID) | ORAL | Status: DC

## 2014-03-29 ENCOUNTER — Telehealth: Payer: Self-pay | Admitting: Neurology

## 2014-03-29 NOTE — Telephone Encounter (Signed)
Rx's were sent on 10/02.  I called back.  Patient says he spoke with the pharmacy yesterday, and they indicate they have not gotten the refills.  I called the pharmacy.  Spoke with Opal Sidles.  She said they are already processing Keppra and see another Rx that has not been entered yet.  She transferred me to [facility] to see if they were able to pull up other Rx.  She was not able to retrieve it at this time and transferred me to [facility]. I gave verbal okay to proceed with filling Vimpat.  They will enter order now Ref # [id number].  Stated the patient needs to call them at [phone] before they can ship the order.  I called the patient back.  Relayed info, and he will contact [facility] regarding shipment.  Advised we will be happy to send Rx to local pharmacy if needed while he is waiting on mail order.  He will check his supply and call us back if needed.

## 2014-03-29 NOTE — Telephone Encounter (Signed)
Patient stated [facility] has called on numerous occassions to for Rx refill for Lacosamide (VIMPAT) 150 MG TABS and levETIRAcetam (KEPPRA) 750 MG tablet.  Please call and advise.

## 2014-04-19 ENCOUNTER — Telehealth: Payer: Self-pay | Admitting: *Deleted

## 2014-04-19 NOTE — Telephone Encounter (Signed)
Left patient a message that his appt will be with MM instead of CM due to schedule change.

## 2014-04-27 ENCOUNTER — Telehealth: Payer: Self-pay | Admitting: Gastroenterology

## 2014-04-27 NOTE — Telephone Encounter (Signed)
Left message for patient to call back. He will have to find out if his insurance prefers [place], [employer], [place]... He would not be able to take sulfasalazine due to sulfa allergy.Marland KitchenMarland Kitchen

## 2014-04-27 NOTE — Telephone Encounter (Signed)
I have advised patient of the information below. He verbalizes understanding and will call back with what insurance prefers.

## 2014-04-28 ENCOUNTER — Other Ambulatory Visit: Payer: Self-pay | Admitting: Internal Medicine

## 2014-04-29 ENCOUNTER — Telehealth: Payer: Self-pay | Admitting: Gastroenterology

## 2014-04-29 MED ORDER — MESALAMINE 1.2 G PO TBEC: 1.2000 g | DELAYED_RELEASE_TABLET | Freq: Two times a day (BID) | ORAL | Status: DC

## 2014-04-29 NOTE — Telephone Encounter (Signed)
Lialda 2.4 g daily

## 2014-04-29 NOTE — Telephone Encounter (Signed)
New rx sent. Patient advised.

## 2014-04-29 NOTE — Telephone Encounter (Signed)
Dr Fuller Plan, please advise.Marland KitchenMarland KitchenMarland Kitchen

## 2014-05-09 ENCOUNTER — Ambulatory Visit (INDEPENDENT_AMBULATORY_CARE_PROVIDER_SITE_OTHER): Payer: 59 | Admitting: Adult Health

## 2014-05-09 ENCOUNTER — Encounter: Payer: Self-pay | Admitting: Adult Health

## 2014-05-09 VITALS — BP 117/71 | HR 79 | Temp 97.3°F | Ht 71.0 in | Wt 224.2 lb

## 2014-05-09 DIAGNOSIS — R569 Unspecified convulsions: Secondary | ICD-10-CM

## 2014-05-09 DIAGNOSIS — R413 Other amnesia: Secondary | ICD-10-CM

## 2014-05-09 MED ORDER — LEVETIRACETAM 1000 MG PO TABS: 1000.0000 mg | ORAL_TABLET | Freq: Two times a day (BID) | ORAL | Status: DC

## 2014-05-09 MED ORDER — LEVETIRACETAM 750 MG PO TABS: 1500.0000 mg | ORAL_TABLET | Freq: Two times a day (BID) | ORAL | Status: DC

## 2014-05-09 MED ORDER — LACOSAMIDE 200 MG PO TABS: 200.0000 mg | ORAL_TABLET | Freq: Two times a day (BID) | ORAL | Status: DC

## 2014-05-09 NOTE — Progress Notes (Signed)
PATIENT: [name] [name] DOB: 1961-02-02  REASON FOR VISIT: follow up HISTORY FROM: patient  HISTORY OF PRESENT ILLNESS: [name] [name] is a 53 year old male with a history of OSA and Seizures. He returns today for follow-up. He is currently taking Keppra and Vimpat and tolerating it well. He has staring episodes. He has 3-4 episodes a month. He has episodes where he regressing to his past and will sometimes not recognize his wife. He states that his seizures are not as frequent as they were.  He does not operate a motor vehicle. His wife drives him wherever he needs to go He is able to complete all ADLs independently. He continues to notice some changes with his memory. His wife reports that he will ask the same questions repeatedly. No new neurological complaints.  HISTORY 10/06/13 (CM): 53 year old male returns for followup. He was last seen in this office 08/12/2013 by Dr. Brett Fairy for his obstructive sleep apnea. He was last seen by me in  October of last year for his seizure disorder. When last seen he had a couple of seizures and his Vimpat was increased to 150 mg twice daily. He has had one seizure since then that was brief in the setting of passing a kidney stone. He continues to complain of mild memory loss. He claims his blood sugars are fairly well controled. He continues to complain of dizziness when changing positions. He returns for repeat evaluation. He has a history of Crohn's disease.  REVIEW OF SYSTEMS: Full 14 system review of systems performed and notable only for those listed, all others are negative:  Hearing loss Abdominal pain, rectal bleeding, diarrhea Joint pain, back pain, walking difficulty Frequent infections Memory loss, dizziness, headache, seizure, speech difficulty, weakness   ALLERGIES: Allergies  Allergen Reactions  . Azithromycin Swelling  . Dilaudid [Hydromorphone Hcl] Nausea And Vomiting  . Penicillins Hives  . Sulfonamide Derivatives Hives    HOME  MEDICATIONS: Outpatient Prescriptions Prior to Visit  Medication Sig Dispense Refill  . aspirin 81 MG tablet Take 81 mg by mouth daily.    Marland Kitchen CALCIUM PO Take 1 tablet by mouth 3 (three) times daily.     . clindamycin (CLINDAGEL) 1 % gel Apply topically 2 (two) times daily. 30 g 0  . Cyanocobalamin (VITAMIN B 12 PO) Take 1 tablet by mouth as needed.     . diltiazem (TIAZAC) 180 MG 24 hr capsule TAKE 1 CAPSULE DAILY 90 capsule 0  . doxycycline (VIBRAMYCIN) 100 MG capsule Take 1 capsule (100 mg total) by mouth 2 (two) times daily. 20 capsule 0  . eletriptan (RELPAX) 40 MG tablet Take 1 tablet at onset of headache, may repeat in 2 hours if headache persists or reoccurs. 12 tablet 4  . EPINEPHrine (EPIPEN) 0.3 mg/0.3 mL DEVI Inject 0.3 mLs (0.3 mg total) into the muscle once. 1 Device 1  . esomeprazole (NEXIUM) 40 MG capsule Take 1 capsule (40 mg total) by mouth 2 (two) times daily. Take 30- 60 min before your first and last meals of the day 355 capsule 3  . folic acid (FOLVITE) 1 MG tablet Take 2 tablets (2 mg total) by mouth daily. 180 tablet 3  . glimepiride (AMARYL) 2 MG tablet TAKE 1 TABLET DAILY BEFORE BREAKFAST 90 tablet 2  . glucosamine-chondroitin 500-400 MG tablet Take 2 tablets by mouth daily.    Marland Kitchen glucose blood (CHOICE DM FORA G20 TEST STRIPS) test strip Use as instructed 100 each 12  . HYDROcodone-acetaminophen (NORCO/VICODIN)  5-325 MG per tablet Take 1 tablet by mouth every 6 (six) hours as needed for moderate pain. 15 tablet 0  . KLOR-CON M20 20 MEQ tablet TAKE 1 TABLET TWICE A DAY 180 tablet 2  . Lacosamide (VIMPAT) 150 MG TABS Take 1 tablet (150 mg total) by mouth 2 (two) times daily. 180 tablet 1  . Lactase (DAIRY-RELIEF PO) Take 3 tablets by mouth 3 (three) times daily.     Marland Kitchen levETIRAcetam (KEPPRA) 750 MG tablet Take 2 tablets (1,500 mg total) by mouth 2 (two) times daily. 360 tablet 1  . mercaptopurine (PURINETHOL) 50 MG tablet Take 2 tablets (100 mg total) by mouth daily. Give on  an empty stomach 1 hour before or 2 hours after meals. Caution: Chemotherapy. 180 tablet 3  . mesalamine (LIALDA) 1.2 G EC tablet Take 1 tablet (1.2 g total) by mouth 2 (two) times daily. 180 tablet 0  . metFORMIN (GLUCOPHAGE) 500 MG tablet TAKE 2 TABLETS EVERY EVENING 180 tablet 2  . methscopolamine (PAMINE FORTE) 5 MG tablet Take 1 tablet (5 mg total) by mouth 2 (two) times daily. 180 tablet 3  . metoprolol tartrate (LOPRESSOR) 25 MG tablet TAKE ONE-HALF (1/2) TABLET (12.5 MG) TWICE A DAY 90 tablet 0  . ONE TOUCH ULTRA TEST test strip USE AS INSTRUCTED 100 each 2  . ONETOUCH DELICA LANCETS 76H MISC USE AS INSTRUCTED (NEEDS OFFICE VISIT) 100 each 0  . saccharomyces boulardii (FLORASTOR) 250 MG capsule Take 250 mg by mouth daily.     . tamsulosin (FLOMAX) 0.4 MG CAPS Take 0.4 mg by mouth as needed.     . traMADol (ULTRAM) 50 MG tablet 1-2 every 4 hours as needed for cough or pain 40 tablet 0   No facility-administered medications prior to visit.    PAST MEDICAL HISTORY: Past Medical History  Diagnosis Date  . Hypertension   . GERD (gastroesophageal reflux disease)   . Adenomatous colon polyp 10/1999  . Osteoporosis   . External hemorrhoids   . Crohn's disease of small and large intestines 1999  . Pneumonia   . Esophageal stricture   . MRSA (methicillin resistant Staphylococcus aureus) 10/2010   . Diabetes mellitus   . Hyperlipemia   . Cancer     skin  . Kidney stones   . Sleep apnea     CPAP machine  . PONV (postoperative nausea and vomiting)   . Complex partial seizure   . Migraines   . Seizures   . Cataract   . History of nuclear stress test 11/15/2010    exercise myoview; normal pattern of perfusion; normal, low risk study     PAST SURGICAL HISTORY: Past Surgical History  Procedure Laterality Date  . Shoulder surgery    . Sinus surgery with instatrak    . Elbow surgery  2012    elbow MRSA infection   . Cataract extraction Bilateral   . Vasectomy    . Eye surgery      . Transthoracic echocardiogram  02/22/2011    EF 55-65%; increased pattern of LVH with mild conc hypertrophy, abnormal relaxation & increased filling pressure (grade 2 diastolic dysfunction); atrial septum thickened (lipomatous hypertrophy)    FAMILY HISTORY: Family History  Problem Relation Age of Onset  . Colon polyps Father   . Heart disease Father     HTN, aneursym  . Stroke Father   . Hyperlipidemia Father   . Hypertension Father   . Heart disease Mother     CABG  at age 74  . Stroke Mother   . Hyperlipidemia Mother   . Hypertension Mother   . Colon cancer Neg Hx   . Stroke Paternal Grandmother   . Stroke Paternal Grandfather   . Other Child     tetrology of fallot (cornealia deland syndrome)    SOCIAL HISTORY: History   Social History  . Marital Status: Married    Spouse Name: [name]    Number of Children: 2  . Years of Education: college   Occupational History  . DATA BASE ADMINISTRATOR [employer]   Social History Main Topics  . Smoking status: Never Smoker   . Smokeless tobacco: Never Used  . Alcohol Use: No  . Drug Use: No  . Sexual Activity: Yes    Birth Control/ Protection: Surgical     Comment: 1 partner in last 12 months   Other Topics Concern  . Not on file   Social History Narrative   Patient is married [name]) and lives at home with his family.   Patient has two children.   Patient has a college education.   Patient is right-handed.   Patient drinks one cup of soda daily.      PHYSICAL EXAM  Filed Vitals:   05/09/14 0828  BP: 117/71  Pulse: 79  Temp: 97.3 F (36.3 C)  TempSrc: Oral  Height: 5' 11"  (1.803 m)  Weight: 224 lb 4 oz (101.719 kg)   Body mass index is 31.29 kg/(m^2).  Generalized: Well developed, in no acute distress   Neurological examination  Mentation: Alert oriented to time, place, history taking. Follows all commands speech and language fluent. MMSE 25/30 Cranial nerve II-XII: Pupils were equal round reactive  to light. Extraocular movements were full, visual field were full on confrontational test. Facial sensation and strength were normal. Uvula tongue midline. Head turning and shoulder shrug  were normal and symmetric. Motor: The motor testing reveals 5 over 5 strength of all 4 extremities. Good symmetric motor tone is noted throughout.  Sensory: Sensory testing is intact to soft touch on all 4 extremities. No evidence of extinction is noted.  Coordination: Cerebellar testing reveals good finger-nose-finger and heel-to-shin bilaterally.  Gait and station: Gait is normal. Tandem gait slightly unsteady. Romberg is negative. No drift is seen.  Reflexes: Deep tendon reflexes are symmetric and normal bilaterally.     DIAGNOSTIC DATA (LABS, IMAGING, TESTING) - I reviewed patient records, labs, notes, testing and imaging myself where available.  Lab Results  Component Value Date   WBC 4.8 07/01/2013   HGB 12.1* 07/01/2013   HCT 34.8* 07/01/2013   MCV 101.2* 07/01/2013   PLT 162 07/01/2013      Component Value Date/Time   NA 137 07/01/2013 1001   K 3.9 07/01/2013 1001   CL 104 07/01/2013 1001   CO2 24 07/01/2013 1001   GLUCOSE 101* 07/01/2013 1001   BUN 13 07/01/2013 1001   CREATININE 0.71 07/01/2013 1001   CREATININE 0.72 01/04/2013 2155   CALCIUM 9.5 07/01/2013 1001   PROT 7.0 07/01/2013 1001   ALBUMIN 4.0 07/01/2013 1001   AST 29 07/01/2013 1001   ALT 20 07/01/2013 1001   ALKPHOS 121* 07/01/2013 1001   BILITOT 1.1 07/01/2013 1001   GFRNONAA >89 07/01/2013 1001   GFRNONAA >90 01/04/2013 2155   GFRAA >89 07/01/2013 1001   GFRAA >90 01/04/2013 2155   Lab Results  Component Value Date   CHOL 164 07/01/2013   HDL 41 07/01/2013   LDLCALC 74  07/01/2013   TRIG 245* 07/01/2013   CHOLHDL 4.0 07/01/2013   Lab Results  Component Value Date   HGBA1C 5.1 07/01/2013   Lab Results  Component Value Date   VITAMINB12 650 07/01/2013   Lab Results  Component Value Date   TSH 1.715  09/29/2011      ASSESSMENT AND PLAN 53 y.o. year old male  has a past medical history of Hypertension; GERD (gastroesophageal reflux disease); Adenomatous colon polyp (10/1999); Osteoporosis; External hemorrhoids; Crohn's disease of small and large intestines (1999); Pneumonia; Esophageal stricture; MRSA (methicillin resistant Staphylococcus aureus) (10/2010 ); Diabetes mellitus; Hyperlipemia; Cancer; Kidney stones; Sleep apnea; PONV (postoperative nausea and vomiting); Complex partial seizure; Migraines; Seizures; Cataract; and History of nuclear stress test (11/15/2010). here with:  1. Seizures  The patient seems to take Keppra and Vimpat for seizure control. He reports that he has about 3-4 staring episodes in a month. I will increase his Vimpat 200 mg twice a day. If the patient is unable to tolerate this medication he will let us know. His memory score has decreased. His MMSE is 25 out of 30 and was previously 27 out of 30. In the future if his memory score continues to decline we will need to consider medication such as Aricept.  If his symptoms worsen or he develops new symptoms he should let us know. Otherwise he will follow up in 2 months or sooner if needed.  Ward Givens, MSN, NP-C 05/09/2014, 8:35 AM [facility] [address] [place], [place] [address] [phone]  Note: This document was prepared with digital dictation and possible smart phrase technology. Any transcriptional errors that result from this process are unintentional.

## 2014-05-09 NOTE — Patient Instructions (Signed)
Seizure, Adult A seizure means there is unusual activity in the brain. A seizure can cause changes in attention or behavior. Seizures often cause shaking (convulsions). Seizures often last from 30 seconds to 2 minutes. HOME CARE   If you are given medicines, take them exactly as told by your doctor.  Keep all doctor visits as told.  Do not swim or drive until your doctor says it is okay.  Teach others what to do if you have a seizure. They should:  Lay you on the ground.  Put a cushion under your head.  Loosen any tight clothing around your neck.  Turn you on your side.  Stay with you until you get better. GET HELP RIGHT AWAY IF:   The seizure lasts longer than 2 to 5 minutes.  The seizure is very bad.  The person does not wake up after the seizure.  The person's attention or behavior changes. Drive the person to the emergency room or call your local emergency services (911 in [place] MAKE SURE YOU:   Understand these instructions.  Will watch your condition.  Will get help right away if you are not doing well or get worse. Document Released: 11/27/2007 Document Revised: 09/02/2011 Document Reviewed: 05/29/2011 [employer] Patient Information 2015 [employer]. This information is not intended to replace advice given to you by your health care provider. Make sure you discuss any questions you have with your health care provider.  

## 2014-05-12 ENCOUNTER — Other Ambulatory Visit: Payer: Self-pay | Admitting: Dermatology

## 2014-05-16 ENCOUNTER — Telehealth: Payer: Self-pay

## 2014-05-16 DIAGNOSIS — R7989 Other specified abnormal findings of blood chemistry: Secondary | ICD-10-CM

## 2014-05-16 DIAGNOSIS — E7211 Homocystinuria: Principal | ICD-10-CM

## 2014-05-16 MED ORDER — FOLIC ACID 1 MG PO TABS: 2.0000 mg | ORAL_TABLET | Freq: Every day | ORAL | Status: DC

## 2014-05-16 NOTE — Telephone Encounter (Signed)
Pt called and req'd we send in Rx for folic acid to [facility]. I sent remaining RFs there and notified pt. Also sent in 7 day supply to [facility] locally and transferred pt to 104 for appt.

## 2014-05-17 ENCOUNTER — Other Ambulatory Visit: Payer: Self-pay | Admitting: Nurse Practitioner

## 2014-05-17 NOTE — Telephone Encounter (Signed)
I called the pharmacy.  Spoke with Seth Bake.  She said they are not able to change Rx's via phone because Vimpat is a controlled substance.  Says the patient's order was already shipped for 30 day supply and we would need to fax a new Rx for 90 day supply so it can be filled this way on next shipment.  Rx has been re-entered for 90 day supply and forwarded to provider for approval.

## 2014-05-17 NOTE — Telephone Encounter (Signed)
Pt needs a refill called in for lacosamide (VIMPAT) 200 MG TABS tablet needs 90 day supply and a 30 day supply was called in to [facility].  Please correct he can get the 90 day supply for the same price.  Please call and advise.

## 2014-05-18 MED ORDER — LACOSAMIDE 200 MG PO TABS: 200.0000 mg | ORAL_TABLET | Freq: Two times a day (BID) | ORAL | Status: DC

## 2014-05-18 NOTE — Telephone Encounter (Signed)
Rx signed and faxed.

## 2014-05-18 NOTE — Telephone Encounter (Signed)
I spoke with patient.  He is aware Rx has been updated.

## 2014-07-07 ENCOUNTER — Encounter: Payer: Self-pay | Admitting: Family Medicine

## 2014-07-07 ENCOUNTER — Other Ambulatory Visit: Payer: Self-pay | Admitting: Physician Assistant

## 2014-07-07 ENCOUNTER — Other Ambulatory Visit: Payer: Self-pay | Admitting: Cardiovascular Disease

## 2014-07-07 ENCOUNTER — Ambulatory Visit (INDEPENDENT_AMBULATORY_CARE_PROVIDER_SITE_OTHER): Payer: 59 | Admitting: Family Medicine

## 2014-07-07 VITALS — BP 110/66 | HR 69 | Temp 98.8°F | Resp 16 | Ht 70.5 in | Wt 229.6 lb

## 2014-07-07 DIAGNOSIS — IMO0002 Reserved for concepts with insufficient information to code with codable children: Secondary | ICD-10-CM

## 2014-07-07 DIAGNOSIS — E7219 Other disorders of sulfur-bearing amino-acid metabolism: Secondary | ICD-10-CM

## 2014-07-07 DIAGNOSIS — E1165 Type 2 diabetes mellitus with hyperglycemia: Secondary | ICD-10-CM

## 2014-07-07 DIAGNOSIS — E781 Pure hyperglyceridemia: Secondary | ICD-10-CM

## 2014-07-07 DIAGNOSIS — G40909 Epilepsy, unspecified, not intractable, without status epilepticus: Secondary | ICD-10-CM

## 2014-07-07 DIAGNOSIS — E7211 Homocystinuria: Secondary | ICD-10-CM

## 2014-07-07 DIAGNOSIS — Z0000 Encounter for general adult medical examination without abnormal findings: Secondary | ICD-10-CM

## 2014-07-07 DIAGNOSIS — R7989 Other specified abnormal findings of blood chemistry: Secondary | ICD-10-CM

## 2014-07-07 DIAGNOSIS — Z23 Encounter for immunization: Secondary | ICD-10-CM

## 2014-07-07 LAB — COMPLETE METABOLIC PANEL WITH GFR
ALT: 33 U/L (ref 0–53)
AST: 35 U/L (ref 0–37)
Albumin: 4.4 g/dL (ref 3.5–5.2)
Alkaline Phosphatase: 106 U/L (ref 39–117)
BILIRUBIN TOTAL: 1.9 mg/dL — AB (ref 0.2–1.2)
BUN: 21 mg/dL (ref 6–23)
CO2: 23 meq/L (ref 19–32)
Calcium: 9.8 mg/dL (ref 8.4–10.5)
Chloride: 101 mEq/L (ref 96–112)
Creat: 0.83 mg/dL (ref 0.50–1.35)
GFR, Est African American: >89 mL/min
GFR, Est Non African American: >89 mL/min
Glucose, Bld: 75 mg/dL (ref 70–99)
Potassium: 4 mEq/L (ref 3.5–5.3)
SODIUM: 134 meq/L — AB (ref 135–145)
Total Protein: 7.3 g/dL (ref 6.0–8.3)

## 2014-07-07 LAB — LIPID PANEL
CHOL/HDL RATIO: 3.7 ratio
Cholesterol: 158 mg/dL (ref 0–200)
HDL: 43 mg/dL (ref >39)
LDL CALC: 81 mg/dL (ref 0–99)
Triglycerides: 170 mg/dL — ABNORMAL HIGH (ref <150)
VLDL: 34 mg/dL (ref 0–40)

## 2014-07-07 LAB — POCT GLYCOSYLATED HEMOGLOBIN (HGB A1C): Hemoglobin A1C: 6

## 2014-07-07 LAB — IFOBT (OCCULT BLOOD): IFOBT: POSITIVE

## 2014-07-07 MED ORDER — FOLIC ACID 1 MG PO TABS: 2.0000 mg | ORAL_TABLET | Freq: Every day | ORAL | Status: DC

## 2014-07-07 MED ORDER — POTASSIUM CHLORIDE CRYS ER 20 MEQ PO TBCR: 20.0000 meq | EXTENDED_RELEASE_TABLET | Freq: Two times a day (BID) | ORAL | Status: DC

## 2014-07-07 MED ORDER — METFORMIN HCL 500 MG PO TABS: ORAL_TABLET | ORAL | Status: DC

## 2014-07-07 MED ORDER — FLUTICASONE PROPIONATE 50 MCG/ACT NA SUSP: 2.0000 | Freq: Every day | NASAL | Status: DC

## 2014-07-07 MED ORDER — METOPROLOL TARTRATE 25 MG PO TABS: ORAL_TABLET | ORAL | Status: DC

## 2014-07-07 MED ORDER — DILTIAZEM HCL ER BEADS 180 MG PO CP24: 180.0000 mg | ORAL_CAPSULE | Freq: Every day | ORAL | Status: DC

## 2014-07-07 MED ORDER — ZOSTER VACCINE LIVE 19400 UNT/0.65ML ~~LOC~~ SOLR: 0.6500 mL | Freq: Once | SUBCUTANEOUS | Status: DC

## 2014-07-07 NOTE — Progress Notes (Signed)
Subjective:  This chart was scribed for [name] Haber, MD by Donato Schultz, Medical Scribe. This patient was seen in Room 26 and the patient's care was started at 3:40 PM.   Patient ID: [name] [name], male    DOB: [birth year], 54 y.o.   MRN: [id number]  HPI HPI Comments: [name] [name] is a 54 y.o. male who presents to the [facility] for an annual exam.  He states that his seizures have become more prevalent and his wife states that he has been having more problems with his memory lately.  The patient states that he did not remember his daughter's soccer team winning her recent soccer game even though he was present at the game.  He was not quite during the game and there was film of him celebrating during the game.  He worked for 16 days straight, 10-12 hours daily starting on December 5th.  His wife states that he did not eat as well as he should have prior to going to work that day or take his medication.  She states that she noticed his memory was negatively affected.  He would like to start the process of receiving disability.   His sugar levels have also been high for the past 2 months.  His numbers have been between 140 and 160 and he attributes his symptoms to stress at work.     He is also complaining of dizziness which he attributes to his seizure medication.  He is also having trouble grasping and holding things.  He is able to tie his shoes and button buttons on his shirt.  He can feel his feet and touch them without difficulty.       He is still complaining of abdominal pain but states that his bowels have gotten better.  Dr. Phineas Douglas is his ophthalmologist and he is due for an eye exam.  He saw a neurologist 2 months ago and he has another appointment next week.      Patient Active Problem List   Diagnosis Date Noted  . Seizure 04/04/2013  . Chest pain 03/12/2013  . Abnormal EKG 03/12/2013  . DOE (dyspnea on exertion) 03/12/2013  . Obstructive sleep apnea  10/01/2012  . Complex partial seizure 04/19/2012  . DM2 (diabetes mellitus, type 2) 09/25/2011  . Altered mental status 09/25/2011  . OTHER DYSPHAGIA 03/27/2009  . TRANSAMINASES, SERUM, ELEVATED 03/27/2009  . Cough 11/10/2008  . GERD 12/07/2007  . COLONIC POLYPS, ADENOMATOUS, HX OF 12/07/2007  . EXTERNAL HEMORRHOIDS 09/11/2007  . CROHN'S DISEASE, LARGE AND SMALL INTESTINES 09/11/2007  . OSTEOPOROSIS 09/11/2007  . HYPERLIPIDEMIA NEC/NOS 02/27/2007  . HYPERTENSION, ESSENTIAL NOS 02/27/2007   Past Medical History  Diagnosis Date  . Hypertension   . GERD (gastroesophageal reflux disease)   . Adenomatous colon polyp 10/1999  . Osteoporosis   . External hemorrhoids   . Crohn's disease of small and large intestines 1999  . Pneumonia   . Esophageal stricture   . MRSA (methicillin resistant Staphylococcus aureus) 10/2010   . Diabetes mellitus   . Hyperlipemia   . Cancer     skin  . Kidney stones   . Sleep apnea     CPAP machine  . PONV (postoperative nausea and vomiting)   . Complex partial seizure   . Migraines   . Seizures   . Cataract   . History of nuclear stress test 11/15/2010    exercise myoview; normal pattern of perfusion; normal, low risk study  Past Surgical History  Procedure Laterality Date  . Shoulder surgery    . Sinus surgery with instatrak    . Elbow surgery  2012    elbow MRSA infection   . Cataract extraction Bilateral   . Vasectomy    . Eye surgery    . Transthoracic echocardiogram  02/22/2011    EF 55-65%; increased pattern of LVH with mild conc hypertrophy, abnormal relaxation & increased filling pressure (grade 2 diastolic dysfunction); atrial septum thickened (lipomatous hypertrophy)   Allergies  Allergen Reactions  . Azithromycin Swelling  . Dilaudid [Hydromorphone Hcl] Nausea And Vomiting  . Penicillins Hives  . Sulfonamide Derivatives Hives   Prior to Admission medications   Medication Sig Start Date End Date Taking? Authorizing Provider    aspirin 81 MG tablet Take 81 mg by mouth daily.   Yes Historical Provider, MD  CALCIUM PO Take 1 tablet by mouth 3 (three) times daily.    Yes Historical Provider, MD  Cyanocobalamin (VITAMIN B 12 PO) Take 1 tablet by mouth as needed.    Yes Historical Provider, MD  eletriptan (RELPAX) 40 MG tablet Take 1 tablet at onset of headache, may repeat in 2 hours if headache persists or reoccurs. 01/18/13  Yes Wendie Agreste, MD  EPINEPHrine (EPIPEN) 0.3 mg/0.3 mL DEVI Inject 0.3 mLs (0.3 mg total) into the muscle once. 02/26/12  Yes Wardell Honour, MD  esomeprazole (NEXIUM) 40 MG capsule Take 1 capsule (40 mg total) by mouth 2 (two) times daily. Take 30- 60 min before your first and last meals of the day 10/14/13  Yes Ladene Artist, MD  folic acid (FOLVITE) 1 MG tablet Take 2 tablets (2 mg total) by mouth daily. 05/16/14  Yes [name] Haber, MD  glimepiride (AMARYL) 2 MG tablet TAKE 1 TABLET DAILY BEFORE BREAKFAST   Yes Mancel Bale, PA-C  KLOR-CON M20 20 MEQ tablet TAKE 1 TABLET TWICE A DAY   Yes Mancel Bale, PA-C  lacosamide (VIMPAT) 200 MG TABS tablet Take 1 tablet (200 mg total) by mouth 2 (two) times daily. 05/18/14  Yes Ward Givens, NP  Lactase (DAIRY-RELIEF PO) Take 3 tablets by mouth 3 (three) times daily.    Yes Historical Provider, MD  levETIRAcetam (KEPPRA) 750 MG tablet Take 2 tablets (1,500 mg total) by mouth 2 (two) times daily. 05/09/14  Yes Ward Givens, NP  mercaptopurine (PURINETHOL) 50 MG tablet Take 2 tablets (100 mg total) by mouth daily. Give on an empty stomach 1 hour before or 2 hours after meals. Caution: Chemotherapy. 10/14/13  Yes Ladene Artist, MD  metFORMIN (GLUCOPHAGE) 500 MG tablet TAKE 2 TABLETS EVERY EVENING   Yes Mancel Bale, PA-C  methscopolamine (PAMINE FORTE) 5 MG tablet Take 1 tablet (5 mg total) by mouth 2 (two) times daily. 10/14/13  Yes Ladene Artist, MD  saccharomyces boulardii (FLORASTOR) 250 MG capsule Take 250 mg by mouth daily.    Yes Historical  Provider, MD  ASACOL HD 800 MG TBEC  03/28/14   Historical Provider, MD  diltiazem (TIAZAC) 180 MG 24 hr capsule Take 1 capsule (180 mg total) by mouth daily. <MUST MAKE APPOINTMENT FOR REFILLS> 07/07/14   Pixie Casino, MD  glucosamine-chondroitin 500-400 MG tablet Take 2 tablets by mouth daily.    Historical Provider, MD  glucose blood (CHOICE DM FORA G20 TEST STRIPS) test strip Use as instructed 11/13/12 02/16/14  [name] Haber, MD  HYDROcodone-acetaminophen (NORCO/VICODIN) 5-325 MG per tablet Take 1 tablet by  mouth every 6 (six) hours as needed for moderate pain. Patient not taking: Reported on 07/07/2014 08/23/13   Areta [name] Dunn, PA-C  metoprolol tartrate (LOPRESSOR) 25 MG tablet TAKE ONE-HALF (1/2) TABLET (12.5 MG) TWICE A DAY (NEED APPOINTMENT) 07/07/14   Pixie Casino, MD  [facility] ULTRA TEST test strip USE AS INSTRUCTED    Mancel Bale, PA-C  [facility] DELICA LANCETS 80D MISC USE AS INSTRUCTED (NEEDS OFFICE VISIT) 02/04/13   Rise Mu, PA-C  tamsulosin (FLOMAX) 0.4 MG CAPS Take 0.4 mg by mouth as needed.     Historical Provider, MD  traMADol (ULTRAM) 50 MG tablet 1-2 every 4 hours as needed for cough or pain Patient not taking: Reported on 07/07/2014 01/12/13   Tanda Rockers, MD   History   Social History  . Marital Status: Married    Spouse Name: [name]    Number of Children: 2  . Years of Education: college   Occupational History  . DATA BASE ADMINISTRATOR [employer]   Social History Main Topics  . Smoking status: Never Smoker   . Smokeless tobacco: Never Used  . Alcohol Use: No  . Drug Use: No  . Sexual Activity: Yes    Birth Control/ Protection: Surgical     Comment: 1 partner in last 12 months   Other Topics Concern  . Not on file   Social History Narrative   Patient is married [name]) and lives at home with his family.   Patient has two children.   Patient has a college education.   Patient is right-handed.   Patient drinks one cup of soda daily.    Review  of Systems  Gastrointestinal: Positive for abdominal pain.  Neurological: Positive for dizziness. Negative for numbness.  Psychiatric/Behavioral: Positive for confusion.  All other systems reviewed and are negative.   Objective:  Physical Exam  Constitutional: He is oriented to person, place, and time. He appears well-developed and well-nourished.  HENT:  Head: Normocephalic and atraumatic.  Right Ear: External ear normal.  Left Ear: External ear normal.  Mouth/Throat: Oropharynx is clear and moist. No oropharyngeal exudate.  Eyes: Conjunctivae and EOM are normal. Pupils are equal, round, and reactive to light. Right eye exhibits no discharge. Left eye exhibits no discharge. No scleral icterus.  Neck: Normal range of motion. Neck supple. Carotid bruit is not present. No thyromegaly present.  Cardiovascular: Normal rate, regular rhythm and normal heart sounds.  Exam reveals no gallop and no friction rub.   No murmur heard. Pulmonary/Chest: Effort normal and breath sounds normal. No respiratory distress. He has no wheezes. He has no rales.  Abdominal: Soft. Bowel sounds are normal. He exhibits no distension and no mass. There is tenderness. There is no rebound and no guarding.  Musculoskeletal: Normal range of motion.  Lymphadenopathy:    He has no cervical adenopathy.  Neurological: He is alert and oriented to person, place, and time.  Skin: Skin is warm and dry.  Psychiatric: He has a normal mood and affect. His behavior is normal.  Nursing note and vitals reviewed.  Results for orders placed or performed in visit on 07/07/14  POCT glycosylated hemoglobin (Hb A1C)  Result Value Ref Range   Hemoglobin A1C 6.0   IFOBT POC (occult bld, rslt in office)  Result Value Ref Range   IFOBT Positive       BP 110/66 mmHg  Pulse 69  Temp(Src) 98.8 F (37.1 C) (Oral)  Resp 16  Ht 5'  10.5" (1.791 m)  Wt 229 lb 9.6 oz (104.146 kg)  BMI 32.47 kg/m2  SpO2 97% Assessment & Plan:    This chart was scribed in my presence and reviewed by me personally.    ICD-9-CM ICD-10-CM   1. Annual physical exam V70.0 Z00.00 CBC     Microalbumin, urine     COMPLETE METABOLIC PANEL WITH GFR     Lipid panel     POCT glycosylated hemoglobin (Hb A1C)     IFOBT POC (occult bld, rslt in office)  2. Seizure disorder 345.90 G40.909 CBC  3. Type 2 diabetes mellitus, uncontrolled 250.02 E11.65 HM Diabetes Foot Exam     CBC     Microalbumin, urine     COMPLETE METABOLIC PANEL WITH GFR     Lipid panel     POCT glycosylated hemoglobin (Hb A1C)  4. Hypertriglyceridemia 272.1 E78.1 Lipid panel  5. Need for shingles vaccine V04.89 Z23 zoster vaccine live, PF, (ZOSTAVAX) 11216 UNT/0.65ML injection  6. Elevated homocysteine 244.6 X50.72 folic acid (FOLVITE) 1 MG tablet  7. Need for prophylactic vaccination against Streptococcus pneumoniae (pneumococcus) V03.82 Z23 Pneumococcal conjugate vaccine 13-valent IM   Given the Crohn's disease, the seizure disorder, the worsening short term memory problems, the uncontrolled diabetes at times; patient needs to pursue disability.  Follow up 2 months  Signed, [name] Haber, MD

## 2014-07-07 NOTE — Telephone Encounter (Signed)
Rx(s) sent to pharmacy electronically.  

## 2014-07-07 NOTE — Patient Instructions (Signed)

## 2014-07-08 ENCOUNTER — Telehealth: Payer: Self-pay | Admitting: Internal Medicine

## 2014-07-08 LAB — MICROALBUMIN, URINE: Microalb, Ur: 1.5 mg/dL (ref <2.0)

## 2014-07-08 NOTE — Telephone Encounter (Signed)
Close encounter 

## 2014-07-12 ENCOUNTER — Encounter: Payer: Self-pay | Admitting: Adult Health

## 2014-07-12 ENCOUNTER — Ambulatory Visit (INDEPENDENT_AMBULATORY_CARE_PROVIDER_SITE_OTHER): Payer: 59 | Admitting: Adult Health

## 2014-07-12 VITALS — BP 116/78 | HR 74 | Ht 71.0 in | Wt 224.4 lb

## 2014-07-12 DIAGNOSIS — R569 Unspecified convulsions: Secondary | ICD-10-CM

## 2014-07-12 DIAGNOSIS — R413 Other amnesia: Secondary | ICD-10-CM

## 2014-07-12 NOTE — Progress Notes (Signed)
PATIENT: [name] [name] DOB: 1961/05/03  REASON FOR VISIT: follow up HISTORY FROM: patient  HISTORY OF PRESENT ILLNESS: [name] [name] is a 54 year old male with a history of OSA and Seizures. He returns today for follow-up. He is currently taking Keppra and Vimpat and tolerating it well. He states that he has had 2-3 seizures since the last visit. He had two seizures before christmas but he states that was due to lack of sleep due to working 16 days straight. He had an episode one week ago with lip movement and rambling speech. He was confused. He states this occur for about 1 hour. His coworkers and wife have witnessed these events. Overall he feels that his seizures have improved. The patient reports that his memory has remained the same. He states that at time she will get names mixed up. He will often have word finding issues.  His last MMSE was 25/30.   HISTORY 05/09/14: [name] [name] is a 54 year old male with a history of OSA and Seizures. He returns today for follow-up. He is currently taking Keppra and Vimpat and tolerating it well. He has staring episodes. He has 3-4 episodes a month. He has episodes where he regressing to his past and will sometimes not recognize his wife. He states that his seizures are not as frequent as they were.  He does not operate a motor vehicle. His wife drives him wherever he needs to go He is able to complete all ADLs independently. He continues to notice some changes with his memory. His wife reports that he will ask the same questions repeatedly. No new neurological complaints.  REVIEW OF SYSTEMS: Out of a complete 14 system review of symptoms, the patient complains only of the following symptoms, and all other reviewed systems are negative. Abdominal Pain, Rectal Bleeding, Diarrhea, Rectal pain, Apnea, Snoring, Walking difficulty, Joint pain, Memory loss, Dizziness, Headache, Seizure  ALLERGIES: Allergies  Allergen Reactions  . Azithromycin Swelling  . Dilaudid  [Hydromorphone Hcl] Nausea And Vomiting  . Penicillins Hives  . Sulfonamide Derivatives Hives    HOME MEDICATIONS: Outpatient Prescriptions Prior to Visit  Medication Sig Dispense Refill  . ASACOL HD 800 MG TBEC     . aspirin 81 MG tablet Take 81 mg by mouth daily.    Marland Kitchen CALCIUM PO Take 1 tablet by mouth 3 (three) times daily.     . Cyanocobalamin (VITAMIN B 12 PO) Take 1 tablet by mouth as needed.     . diltiazem (TIAZAC) 180 MG 24 hr capsule Take 1 capsule (180 mg total) by mouth daily. 90 capsule 3  . eletriptan (RELPAX) 40 MG tablet Take 1 tablet at onset of headache, may repeat in 2 hours if headache persists or reoccurs. 12 tablet 4  . EPINEPHrine (EPIPEN) 0.3 mg/0.3 mL DEVI Inject 0.3 mLs (0.3 mg total) into the muscle once. 1 Device 1  . esomeprazole (NEXIUM) 40 MG capsule Take 1 capsule (40 mg total) by mouth 2 (two) times daily. Take 30- 60 min before your first and last meals of the day 180 capsule 3  . fluticasone (FLONASE) 50 MCG/ACT nasal spray Place 2 sprays into both nostrils daily. 48 g 3  . folic acid (FOLVITE) 1 MG tablet Take 2 tablets (2 mg total) by mouth daily. 180 tablet 3  . glimepiride (AMARYL) 2 MG tablet TAKE 1 TABLET DAILY BEFORE BREAKFAST 90 tablet 0  . glucosamine-chondroitin 500-400 MG tablet Take 2 tablets by mouth daily.    Marland Kitchen  HYDROcodone-acetaminophen (NORCO/VICODIN) 5-325 MG per tablet Take 1 tablet by mouth every 6 (six) hours as needed for moderate pain. 15 tablet 0  . lacosamide (VIMPAT) 200 MG TABS tablet Take 1 tablet (200 mg total) by mouth 2 (two) times daily. 180 tablet 1  . Lactase (DAIRY-RELIEF PO) Take 3 tablets by mouth 3 (three) times daily.     Marland Kitchen levETIRAcetam (KEPPRA) 750 MG tablet Take 2 tablets (1,500 mg total) by mouth 2 (two) times daily. 360 tablet 3  . mercaptopurine (PURINETHOL) 50 MG tablet Take 2 tablets (100 mg total) by mouth daily. Give on an empty stomach 1 hour before or 2 hours after meals. Caution: Chemotherapy. 180 tablet 3  .  metFORMIN (GLUCOPHAGE) 500 MG tablet TAKE 2 TABLETS EVERY EVENING 180 tablet 3  . methscopolamine (PAMINE FORTE) 5 MG tablet Take 1 tablet (5 mg total) by mouth 2 (two) times daily. 180 tablet 3  . metoprolol tartrate (LOPRESSOR) 25 MG tablet TAKE ONE-HALF (1/2) TABLET (12.5 MG) TWICE A DAY ) 90 tablet 3  . ONE TOUCH ULTRA TEST test strip USE AS INSTRUCTED 100 each 2  . ONETOUCH DELICA LANCETS 00F MISC USE AS INSTRUCTED (NEEDS OFFICE VISIT) 100 each 0  . potassium chloride SA (KLOR-CON M20) 20 MEQ tablet Take 1 tablet (20 mEq total) by mouth 2 (two) times daily. 180 tablet 3  . saccharomyces boulardii (FLORASTOR) 250 MG capsule Take 250 mg by mouth daily.     . tamsulosin (FLOMAX) 0.4 MG CAPS Take 0.4 mg by mouth as needed.     . traMADol (ULTRAM) 50 MG tablet 1-2 every 4 hours as needed for cough or pain 40 tablet 0  . zoster vaccine live, PF, (ZOSTAVAX) 74944 UNT/0.65ML injection Inject 19,400 Units into the skin once. 1 each 0  . glucose blood (CHOICE DM FORA G20 TEST STRIPS) test strip Use as instructed 100 each 12   No facility-administered medications prior to visit.    PAST MEDICAL HISTORY: Past Medical History  Diagnosis Date  . Hypertension   . GERD (gastroesophageal reflux disease)   . Adenomatous colon polyp 10/1999  . Osteoporosis   . External hemorrhoids   . Crohn's disease of small and large intestines 1999  . Pneumonia   . Esophageal stricture   . MRSA (methicillin resistant Staphylococcus aureus) 10/2010   . Diabetes mellitus   . Hyperlipemia   . Cancer     skin  . Kidney stones   . Sleep apnea     CPAP machine  . PONV (postoperative nausea and vomiting)   . Complex partial seizure   . Migraines   . Seizures   . Cataract   . History of nuclear stress test 11/15/2010    exercise myoview; normal pattern of perfusion; normal, low risk study   . Skin cancer     PAST SURGICAL HISTORY: Past Surgical History  Procedure Laterality Date  . Shoulder surgery    .  Sinus surgery with instatrak    . Elbow surgery  2012    elbow MRSA infection   . Cataract extraction Bilateral   . Vasectomy    . Eye surgery    . Transthoracic echocardiogram  02/22/2011    EF 55-65%; increased pattern of LVH with mild conc hypertrophy, abnormal relaxation & increased filling pressure (grade 2 diastolic dysfunction); atrial septum thickened (lipomatous hypertrophy)    FAMILY HISTORY: Family History  Problem Relation Age of Onset  . Colon polyps Father   . Heart disease  Father     HTN, aneursym  . Stroke Father   . Hyperlipidemia Father   . Hypertension Father   . Heart disease Mother     CABG at age 54  . Stroke Mother   . Hyperlipidemia Mother   . Hypertension Mother   . Colon cancer Neg Hx   . Stroke Paternal Grandmother   . Stroke Paternal Grandfather   . Other Child     tetrology of fallot (cornealia deland syndrome)    SOCIAL HISTORY: History   Social History  . Marital Status: Married    Spouse Name: [name]    Number of Children: 2  . Years of Education: college   Occupational History  . 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 Smoking status: Never Smoker   . Smokeless tobacco: Never Used  . Alcohol Use: No  . Drug Use: No  . Sexual Activity: Yes    Birth Control/ Protection: Surgical     Comment: 1 partner in last 12 months   Other Topics Concern  . Not on file   Social History Narrative   Patient is married [name]) and lives at home with his family.   Patient has two children.   Patient has a college education.   Patient is right-handed.   Patient drinks one cup of soda daily.      PHYSICAL EXAM  Filed Vitals:   07/12/14 0857  BP: 116/78  Pulse: 74  Height: 5' 11"  (1.803 m)  Weight: 224 lb 6.4 oz (101.787 kg)   Body mass index is 31.31 kg/(m^2).  Generalized: Well developed, in no acute distress   Neurological examination  Mentation: Alert oriented to time, place, history taking.  Follows all commands speech and language fluent. MMSE 26/30 Cranial nerve II-XII: Pupils were equal round reactive to light. Extraocular movements were full, visual field were full on confrontational test. Facial sensation and strength were normal. Uvula tongue midline. Head turning and shoulder shrug  were normal and symmetric. Motor: The motor testing reveals 5 over 5 strength of all 4 extremities. Good symmetric motor tone is noted throughout.  Sensory: Sensory testing is intact to soft touch on all 4 extremities. No evidence of extinction is noted.  Coordination: Cerebellar testing reveals good finger-nose-finger and heel-to-shin bilaterally.  Gait and station: Gait is normal. Tandem gait is normal. Romberg is negative. No drift is seen.  Reflexes: Deep tendon reflexes are symmetric and normal bilaterally.  Marland Kitchen   DIAGNOSTIC DATA (LABS, IMAGING, TESTING) - I reviewed patient records, labs, notes, testing and imaging myself where available.  Lab Results  Component Value Date   WBC 4.8 07/01/2013   HGB 12.1* 07/01/2013   HCT 34.8* 07/01/2013   MCV 101.2* 07/01/2013   PLT 162 07/01/2013      Component Value Date/Time   NA 134* 07/07/2014 1612   K 4.0 07/07/2014 1612   CL 101 07/07/2014 1612   CO2 23 07/07/2014 1612   GLUCOSE 75 07/07/2014 1612   BUN 21 07/07/2014 1612   CREATININE 0.83 07/07/2014 1612   CREATININE 0.72 01/04/2013 2155   CALCIUM 9.8 07/07/2014 1612   PROT 7.3 07/07/2014 1612   ALBUMIN 4.4 07/07/2014 1612   AST 35 07/07/2014 1612   ALT 33 07/07/2014 1612   ALKPHOS 106 07/07/2014 1612   BILITOT 1.9* 07/07/2014 1612   GFRNONAA >89 07/07/2014 1612   GFRNONAA >90 01/04/2013 2155   GFRAA >89 07/07/2014 1612   GFRAA >90 01/04/2013  2155   Lab Results  Component Value Date   CHOL 158 07/07/2014   HDL 43 07/07/2014   LDLCALC 81 07/07/2014   TRIG 170* 07/07/2014   CHOLHDL 3.7 07/07/2014   Lab Results  Component Value Date   HGBA1C 6.0 07/07/2014   Lab Results    Component Value Date   VITAMINB12 650 07/01/2013   Lab Results  Component Value Date   TSH 1.715 09/29/2011      ASSESSMENT AND PLAN 54 y.o. year old male  has a past medical history of Hypertension; GERD (gastroesophageal reflux disease); Adenomatous colon polyp (10/1999); Osteoporosis; External hemorrhoids; Crohn's disease of small and large intestines (1999); Pneumonia; Esophageal stricture; MRSA (methicillin resistant Staphylococcus aureus) (10/2010 ); Diabetes mellitus; Hyperlipemia; Cancer; Kidney stones; Sleep apnea; PONV (postoperative nausea and vomiting); Complex partial seizure; Migraines; Seizures; Cataract; History of nuclear stress test (11/15/2010); and Skin cancer. here with :  1. Seizures 2. Memory loss  Continue Keppra and Vimpat. No refills needed today Make sure you are getting adequate rest/sleep to prevent seizure events.  Memory score is stable- MMSE 26/30 If your symptoms worsen or you develop new symptoms please let us know.  Follow-up in 6 months or sooner if needed.   Ward Givens, MSN, NP-C 07/12/2014, 9:02 AM [facility] [address], [place], [place] [address] [phone]  Note: This document was prepared with digital dictation and possible smart phrase technology. Any transcriptional errors that result from this process are unintentional.

## 2014-07-12 NOTE — Progress Notes (Signed)
I agree with the assessment and plan as directed by NP .The patient is known to me .   Everrett Lacasse, MD  

## 2014-07-12 NOTE — Patient Instructions (Signed)
Continue Keppra and Vimpat.  Make sure you are getting adequate rest/sleep to prevent seizure events.  Memory score is stable If your symptoms worsen or you develop new symptoms please let us know.  Follow-up in 6 months or sooner if needed.

## 2014-07-14 ENCOUNTER — Telehealth: Payer: Self-pay

## 2014-07-14 DIAGNOSIS — Z0000 Encounter for general adult medical examination without abnormal findings: Secondary | ICD-10-CM

## 2014-07-14 LAB — CBC

## 2014-07-14 NOTE — Telephone Encounter (Signed)
[name] [name] was not sent for this pt's CBC. Do you want pt to RTC for a redraw, no charge?

## 2014-07-14 NOTE — Telephone Encounter (Signed)
I would like the CBC

## 2014-07-17 NOTE — Telephone Encounter (Signed)
Lab pending. Pt advised.

## 2014-07-20 ENCOUNTER — Other Ambulatory Visit (INDEPENDENT_AMBULATORY_CARE_PROVIDER_SITE_OTHER): Payer: 59 | Admitting: Family Medicine

## 2014-07-20 DIAGNOSIS — Z0000 Encounter for general adult medical examination without abnormal findings: Secondary | ICD-10-CM

## 2014-07-20 LAB — POCT CBC
Granulocyte percent: 56 %G (ref 37–80)
HCT, POC: 35.7 % — AB (ref 43.5–53.7)
Hemoglobin: 11.5 g/dL — AB (ref 14.1–18.1)
Lymph, poc: 1.6 (ref 0.6–3.4)
MCH, POC: 33 pg — AB (ref 27–31.2)
MCHC: 32.1 g/dL (ref 31.8–35.4)
MCV: 102.6 fL — AB (ref 80–97)
MID (cbc): 0.7 (ref 0–0.9)
MPV: 7.4 fL (ref 0–99.8)
POC Granulocyte: 2.9 (ref 2–6.9)
POC LYMPH PERCENT: 30.6 %L (ref 10–50)
POC MID %: 13.4 %M — AB (ref 0–12)
Platelet Count, POC: 144 10*3/uL (ref 142–424)
RBC: 3.48 M/uL — AB (ref 4.69–6.13)
RDW, POC: 18.4 %
WBC: 5.2 10*3/uL (ref 4.6–10.2)

## 2014-08-02 ENCOUNTER — Telehealth: Payer: Self-pay | Admitting: Family Medicine

## 2014-08-02 NOTE — Telephone Encounter (Signed)
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 This has been scanned to patient's chart. Patient's wife stated that [employer] will be faxing over [facility] paperwork either today or tomorrow. Explained to patient there is a $15.00 charge for this type of paperwork. She or patient will pay by phone when we contact them that paperwork has been received.

## 2014-08-04 NOTE — Telephone Encounter (Signed)
ppw received from [employer], and placed in Dr. Lenn Cal box.

## 2014-08-06 NOTE — Telephone Encounter (Signed)
[employer] faxed a correction form for patient's FMLA ppw. States that question 5 needed a date correction. I have made the correction and faxed back to [employer]. Patient's wife notified. They can pick up the original copy from [facility] walk in center

## 2014-08-10 NOTE — Telephone Encounter (Signed)
[name] [name] brought in [employer] to the [address] building, and had to have a second amendment made. She also brought in paperwork from [place] that is also needing the same information on. I filled out [employer] paperwork based on Dr.L's notes. Can a PA sign this on his behalf ? Will Scan PPW into his chart, and keep at Disability desk. Please get back to me if PA can do this for Pt.

## 2014-08-12 NOTE — Telephone Encounter (Signed)
Papers completed. Placed in FMLA/Disability box at [employer].

## 2014-08-12 NOTE — Telephone Encounter (Signed)
Thank you Chelle! I will place this in your box. I have it 90% filled out, the only part I questioned was #12.

## 2014-08-12 NOTE — Telephone Encounter (Signed)
Faxed PPW to [employer], ant notified [name].

## 2014-08-12 NOTE — Telephone Encounter (Signed)
I will complete the paperwork on Dr. Pauletta Browns behalf.

## 2014-08-28 ENCOUNTER — Ambulatory Visit (INDEPENDENT_AMBULATORY_CARE_PROVIDER_SITE_OTHER): Payer: 59 | Admitting: Family Medicine

## 2014-08-28 VITALS — BP 120/72 | HR 78 | Temp 98.2°F | Resp 16 | Ht 71.0 in | Wt 227.2 lb

## 2014-08-28 DIAGNOSIS — F05 Delirium due to known physiological condition: Secondary | ICD-10-CM

## 2014-08-28 DIAGNOSIS — L989 Disorder of the skin and subcutaneous tissue, unspecified: Secondary | ICD-10-CM | POA: Diagnosis not present

## 2014-08-28 DIAGNOSIS — E1165 Type 2 diabetes mellitus with hyperglycemia: Secondary | ICD-10-CM | POA: Diagnosis not present

## 2014-08-28 DIAGNOSIS — R410 Disorientation, unspecified: Secondary | ICD-10-CM

## 2014-08-28 DIAGNOSIS — K50919 Crohn's disease, unspecified, with unspecified complications: Secondary | ICD-10-CM | POA: Diagnosis not present

## 2014-08-28 DIAGNOSIS — IMO0002 Reserved for concepts with insufficient information to code with codable children: Secondary | ICD-10-CM

## 2014-08-28 LAB — POCT GLYCOSYLATED HEMOGLOBIN (HGB A1C): Hemoglobin A1C: 5.9

## 2014-08-28 NOTE — Progress Notes (Signed)
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 He has several problems that disabled him: 1. Crohn's disease-chronic diarrhea about 99% a time associated with abdominal pain sometimes doubling him over. He has been taking Crohn's disease medicine for over a year but the diarrhea interferes with concentration and job completion  2: Memory loss-patient has periods of time, particularly when under stress, when he can't remember where he is always doing. For example last Thursday he was at the grocery store and couldn't remember how to get home. With some help from the attendant, he eventually figured this out. He is seen in neurologist who feels that he's reached his baseline now and will have continued problems with memory and problem solving rest of his life. His last neurology appointment was in January and he has a follow-up appointment in the summer.  3: Diabetes-patient is now running 140-170 which is higher than he had been. He's been under more stress. He's not having blurry vision, polyuria or polydipsia and he is monitoring his blood sugars regularly  4: Dizziness and apraxia-patient intermittently gets dizzy and needs help to stand up and make his way. He also has some numbness in his hands and difficulty holding onto small objects.   Patient is here also because of a right arm lesion which she's had for several months which is enlarging has become quite painful. Some mid right forearm is crusty and occasionally bleeds.  Objective: No acute distress, patient seen with wife HEENT: Unremarkable Chest: Clear Heart: Regular no murmur Abdomen: Hyperactive bowel sounds, diffuse mild tenderness Skin: Verrucous growth on the right forearm with crustiness and tenderness and no surrounding erythema.   This chart was scribed in my presence and reviewed by me personally.    ICD-9-CM ICD-10-CM   1. Subacute confusional state 293.1 [facility] pathology  2. Crohn disease,  unspecified complication 847.8 S12.820   3. Diabetes type 2, uncontrolled 250.02 E11.65 POCT glycosylated hemoglobin (Hb A1C)  4. Skin lesion of right arm 709.9 L98.9      Signed, [name], MD

## 2014-08-28 NOTE — Patient Instructions (Signed)
Sutured Wound Care °Sutures are stitches that can be used to close wounds. Wound care helps prevent pain and infection.  °HOME CARE INSTRUCTIONS  °· Rest and elevate the injured area until all the pain and swelling are gone. °· Only take over-the-counter or prescription medicines for pain, discomfort, or fever as directed by your caregiver. °· After 48 hours, gently wash the area with mild soap and water once a day, or as directed. Rinse off the soap. Pat the area dry with a clean towel. Do not rub the wound. This may cause bleeding. °· Follow your caregiver's instructions for how often to change the bandage (dressing). Stop using a dressing after 2 days or after the wound stops draining. °· If the dressing sticks, moisten it with soapy water and gently remove it. °· Apply ointment on the wound as directed. °· Avoid stretching a sutured wound. °· Drink enough fluids to keep your urine clear or pale yellow. °· Follow up with your caregiver for suture removal as directed. °· Use sunscreen on your wound for the next 3 to 6 months so the scar will not darken. °SEEK IMMEDIATE MEDICAL CARE IF:  °· Your wound becomes red, swollen, hot, or tender. °· You have increasing pain in the wound. °· You have a red streak that extends from the wound. °· There is pus coming from the wound. °· You have a fever. °· You have shaking chills. °· There is a bad smell coming from the wound. °· You have persistent bleeding from the wound. °MAKE SURE YOU:  °· Understand these instructions. °· Will watch your condition. °· Will get help right away if you are not doing well or get worse. °Document Released: 07/18/2004 Document Revised: 09/02/2011 Document Reviewed: 10/14/2010 °ExitCare® Patient Information ©2015 [employer]. This information is not intended to replace advice given to you by your health care provider. Make sure you discuss any questions you have with your health care provider. ° °

## 2014-08-31 ENCOUNTER — Other Ambulatory Visit: Payer: Self-pay | Admitting: Gastroenterology

## 2014-08-31 NOTE — Telephone Encounter (Signed)
NEEDS OFFICE VISIT FOR ANY FURTHER REFILLS! 

## 2014-09-04 ENCOUNTER — Ambulatory Visit (INDEPENDENT_AMBULATORY_CARE_PROVIDER_SITE_OTHER): Payer: 59 | Admitting: Family Medicine

## 2014-09-04 VITALS — BP 122/70 | HR 80 | Temp 97.9°F | Resp 16 | Ht 71.0 in | Wt 227.0 lb

## 2014-09-04 DIAGNOSIS — B078 Other viral warts: Secondary | ICD-10-CM

## 2014-09-04 DIAGNOSIS — B079 Viral wart, unspecified: Secondary | ICD-10-CM

## 2014-09-04 NOTE — Progress Notes (Signed)
This chart was scribed for [name] Haber, MD by [name] [name], Medical Scribe. This patient was seen in room 9 and the patient's care was started at 3:44 PM.  Subjective:    Patient ID: [name] [name], male    DOB: [birth year], 54 y.o.   MRN: [id number]  Chief Complaint  Patient presents with   Suture / Staple Removal    right arm    HPI [name] [name] is a 54 y.o. male with PMhx of chron's disease and DM, presents to the office for a suture removal his right arm. Pt was here on 08/28/2014 with a lesion to his right arm which he states was getting uncomfortable. Area was cut and biopsied. Biopsy came back negative. He states that he is doing very well, no associated symptoms endorsed.   Review of Systems  Constitutional: Negative for fever and chills.  HENT: Negative for congestion.   Respiratory: Negative for cough and shortness of breath.   Gastrointestinal: Negative for nausea, vomiting and abdominal pain.  Skin: Positive for wound. Negative for rash.  Neurological: Negative for weakness and numbness.    Objective:   Physical Exam  Vitals reviewed.   BP 122/70 mmHg   Pulse 80   Temp(Src) 97.9 F (36.6 C) (Oral)   Resp 16   Ht 5' 11"  (1.803 m)   Wt 227 lb (102.967 kg)   BMI 31.67 kg/m2   SpO2 95%  General Appearance:    Alert, cooperative, no distress, appears stated age  Head:    Normocephalic, without obvious abnormality, atraumatic  Eyes:    PERRL, conjunctiva/corneas clear, EOM's intact, fundi    benign, both eyes       Ears:    Normal TM's and external ear canals, both ears  Nose:   Nares normal, septum midline, mucosa normal, no drainage   or sinus tenderness  Throat:   Lips, mucosa, and tongue normal; teeth and gums normal  Neck:   Supple, symmetrical, trachea midline, no adenopathy;       thyroid:  No enlargement/tenderness/nodules; no carotid   bruit or JVD  Back:     Symmetric, no curvature, ROM normal, no CVA tenderness  Lungs:     Clear to auscultation  bilaterally, respirations unlabored  Chest wall:    No tenderness or deformity  Heart:    Regular rate and rhythm, S1 and S2 normal, no murmur, rub   or gallop  Abdomen:     Soft, non-tender, bowel sounds active all four quadrants,    no masses, no organomegaly  Extremities:   Extremities normal, atraumatic, no cyanosis or edema  Pulses:   2+ and symmetric all extremities  Skin:   Well healing wound to right lateral arm. Skin color, texture, turgor normal, no rashes or lesions  Lymph nodes:   Cervical, supraclavicular, and axillary nodes normal  Neurologic:   CNII-XII intact. Normal strength, sensation and reflexes      throughout   SUTURE REMOVAL Performed by: [name] Haber, MD Consent: Verbal consent obtained. Patient identity confirmed: provided demographic data. Location: right arm Wound Appearance: clean Sutures/Staples Removed: 5 Patient tolerance: Patient tolerated the procedure well with no immediate complications. friable tissue insurrounding area, so suture line blotted dry and dermabond applied.  Biopsy result and last A1C provided to patient   Assessment & Plan:   This chart was scribed in my presence and reviewed by me personally.    ICD-9-CM ICD-10-CM   1. Verruca vulgaris 078.10 B07.9  Signed, [name] Haber, MD

## 2014-09-04 NOTE — Patient Instructions (Signed)
Results for orders placed or performed in visit on 08/28/14  POCT glycosylated hemoglobin (Hb A1C)  Result Value Ref Range   Hemoglobin A1C 5.9    Warts Warts are a common viral infection. They are most commonly caused by the human papillomavirus (HPV). Warts can occur at all ages. However, they occur most frequently in older children and infrequently in the elderly. Warts may be single or multiple. Location and size varies. Warts can be spread by scratching the wart and then scratching normal skin. The life cycle of warts varies. However, most will disappear over many months to a couple years. Warts commonly do not cause problems (asymptomatic) unless they are over an area of pressure, such as the bottom of the foot. If they are large enough, they may cause pain with walking. DIAGNOSIS  Warts are most commonly diagnosed by their appearance. Tissue samples (biopsies) are not required unless the wart looks abnormal. Most warts have a rough surface, are round, oval, or irregular, and are skin-colored to light yellow, brown, or gray. They are generally less than  inch (1.3 cm), but they can be any size. TREATMENT   Observation or no treatment.  Freezing with liquid nitrogen.  High heat (cautery).  Boosting the body's immunity to fight off the wart (immunotherapy using Candida antigen).  Laser surgery.  Application of various irritants and solutions. HOME CARE INSTRUCTIONS  Follow your caregiver's instructions. No special precautions are necessary. Often, treatment may be followed by a return (recurrence) of warts. Warts are generally difficult to treat and get rid of. If treatment is done in a clinic setting, usually more than 1 treatment is required. This is usually done on only a monthly basis until the wart is completely gone. SEEK IMMEDIATE MEDICAL CARE IF: The treated skin becomes red, puffy (swollen), or painful. Document Released: 03/20/2005 Document Revised: 10/05/2012 Document  Reviewed: 09/15/2009 [facility] Patient Information 2015 [place], [place]. This information is not intended to replace advice given to you by your health care provider. Make sure you discuss any questions you have with your health care provider.

## 2014-09-09 ENCOUNTER — Encounter: Payer: Self-pay | Admitting: Family Medicine

## 2014-09-09 ENCOUNTER — Encounter: Payer: Self-pay | Admitting: Nurse Practitioner

## 2014-09-09 ENCOUNTER — Other Ambulatory Visit: Payer: Self-pay | Admitting: Family Medicine

## 2014-09-13 ENCOUNTER — Ambulatory Visit (INDEPENDENT_AMBULATORY_CARE_PROVIDER_SITE_OTHER): Payer: 59 | Admitting: Family Medicine

## 2014-09-13 VITALS — BP 110/62 | HR 95 | Temp 97.8°F | Resp 16 | Ht 71.0 in | Wt 227.2 lb

## 2014-09-13 DIAGNOSIS — G40909 Epilepsy, unspecified, not intractable, without status epilepticus: Secondary | ICD-10-CM

## 2014-09-13 DIAGNOSIS — E119 Type 2 diabetes mellitus without complications: Secondary | ICD-10-CM

## 2014-09-13 DIAGNOSIS — K50919 Crohn's disease, unspecified, with unspecified complications: Secondary | ICD-10-CM

## 2014-09-13 DIAGNOSIS — R413 Other amnesia: Secondary | ICD-10-CM

## 2014-09-13 LAB — POCT CBC
Granulocyte percent: 65.4 %G (ref 37–80)
HCT, POC: 41.7 % — AB (ref 43.5–53.7)
Hemoglobin: 13.6 g/dL — AB (ref 14.1–18.1)
Lymph, poc: 1.6 (ref 0.6–3.4)
MCH, POC: 34.2 pg — AB (ref 27–31.2)
MCHC: 32.7 g/dL (ref 31.8–35.4)
MCV: 104.7 fL — AB (ref 80–97)
MID (cbc): 0.8 (ref 0–0.9)
MPV: 7.4 fL (ref 0–99.8)
POC Granulocyte: 4.5 (ref 2–6.9)
POC LYMPH PERCENT: 23.7 %L (ref 10–50)
POC MID %: 10.9 %M (ref 0–12)
Platelet Count, POC: 173 10*3/uL (ref 142–424)
RBC: 3.98 M/uL — AB (ref 4.69–6.13)
RDW, POC: 18.8 %
WBC: 6.9 10*3/uL (ref 4.6–10.2)

## 2014-09-13 LAB — POCT SEDIMENTATION RATE: POCT SED RATE: 34 mm/hr — AB (ref 0–22)

## 2014-09-13 NOTE — Patient Instructions (Signed)
Subjective:  This chart was scribed for [name] Haber, MD by [name] [name], Medical scribe. This patient was seen in ROOM 1 and the patient's care was started 6:48 PM.   Patient ID: [name] [name], male    DOB: October 19, 1960, 54 y.o.   MRN: [id number] Chief Complaint  Patient presents with  . Follow-up   HPI HPI Comments: [name] [name] is a 54 y.o. male with a history of HTN, Crohn's disease, seizures and DM who presents to [facility] for a follow up visit for his memory loss. Pt reports that he has an appointment in June with neurology. He says that he failed the memory test his last visit to the neurologist. Pt states that he did not remember how to get home a few weeks ago. He states his most recent A1C was 5.9. He says that has is walking every day recently. He reports no alleviating or modifying factors at this time.   Pt also reports that he had a Crohn's attack this morning and reports associated generalized abdominal tenderness at this time.   In general, [name] [name] suffers confusion and fogginess almost every day to some degree. Some days it is mild wall other days it's quite severe. He will typically assess and questions multiple times a day and not just because he can't remember the answer but because he doesn't even remember ever being asked the question. He'll sometimes start conversations over that he had had the day before area he is dizzy and has balance issues almost every day. He has trouble grasping items and hauling things on a daily basis.  On March 2 [name] [name] walked to the shopping center near the house. He got a couple things from the store but then got really confused and trying to check out. He had to get some help with a self checkout. The confusion continued and [name] [name] had to hang around the gross over several minutes before he could remember exactly what he was doing and how to get back home.  On March 15, patient woke up early to go to throat. He was totally confused and had no idea what  year or month it was. He was not sure about with the election was and who was running. He did not recognize his cell phone, hisf, or the new car. He didn't recognize several pictures on the refrigerator from the 2016 home calendar. He did recognize a picture of his daughter from her junior high, but did not know that she was a Tree surgeon and did not know the school she is attending currently.  March 19 patient awoke from a brief afternoon nap confused because he had it. He had trouble with balance and felt like he was going to throw up the rest the evening until he went to bed.   Past Medical History  Diagnosis Date  . Hypertension   . GERD (gastroesophageal reflux disease)   . Adenomatous colon polyp 10/1999  . Osteoporosis   . External hemorrhoids   . Crohn's disease of small and large intestines 1999  . Pneumonia   . Esophageal stricture   . MRSA (methicillin resistant Staphylococcus aureus) 10/2010   . Diabetes mellitus   . Hyperlipemia   . Cancer     skin  . Kidney stones   . Sleep apnea     CPAP machine  . PONV (postoperative nausea and vomiting)   . Complex partial seizure   . Migraines   . Seizures   . Cataract   . History  of nuclear stress test 11/15/2010    exercise myoview; normal pattern of perfusion; normal, low risk study   . Skin cancer    Patient Active Problem List   Diagnosis Date Noted  . Seizure 04/04/2013  . Chest pain 03/12/2013  . Abnormal EKG 03/12/2013  . DOE (dyspnea on exertion) 03/12/2013  . Obstructive sleep apnea 10/01/2012  . Complex partial seizure 04/19/2012  . DM2 (diabetes mellitus, type 2) 09/25/2011  . Altered mental status 09/25/2011  . OTHER DYSPHAGIA 03/27/2009  . TRANSAMINASES, SERUM, ELEVATED 03/27/2009  . Cough 11/10/2008  . GERD 12/07/2007  . COLONIC POLYPS, ADENOMATOUS, HX OF 12/07/2007  . EXTERNAL HEMORRHOIDS 09/11/2007  . CROHN'S DISEASE, LARGE AND SMALL INTESTINES 09/11/2007  . OSTEOPOROSIS 09/11/2007  .  HYPERLIPIDEMIA NEC/NOS 02/27/2007  . HYPERTENSION, ESSENTIAL NOS 02/27/2007   Current Outpatient Prescriptions on File Prior to Visit  Medication Sig Dispense Refill  . ASACOL HD 800 MG TBEC     . aspirin 81 MG tablet Take 81 mg by mouth daily.    Marland Kitchen CALCIUM PO Take 1 tablet by mouth 3 (three) times daily.     . Cyanocobalamin (VITAMIN B 12 PO) Take 1 tablet by mouth as needed.     . diltiazem (TIAZAC) 180 MG 24 hr capsule Take 1 capsule (180 mg total) by mouth daily. 90 capsule 3  . eletriptan (RELPAX) 40 MG tablet Take 1 tablet at onset of headache, may repeat in 2 hours if headache persists or reoccurs. 12 tablet 4  . EPINEPHrine (EPIPEN) 0.3 mg/0.3 mL DEVI Inject 0.3 mLs (0.3 mg total) into the muscle once. 1 Device 1  . esomeprazole (NEXIUM) 40 MG capsule TAKE 1 CAPSULE TWICE A DAY 30 TO 60 MINUTES BEFORE YOU FIRST AND LAST MEALS OF THE DAY 180 capsule 0  . fluticasone (FLONASE) 50 MCG/ACT nasal spray Place 2 sprays into both nostrils daily. 48 g 3  . folic acid (FOLVITE) 1 MG tablet Take 2 tablets (2 mg total) by mouth daily. 180 tablet 3  . glimepiride (AMARYL) 2 MG tablet TAKE 1 TABLET DAILY BEFORE BREAKFAST 90 tablet 0  . glucosamine-chondroitin 500-400 MG tablet Take 2 tablets by mouth daily.    Marland Kitchen glucose blood (CHOICE DM FORA G20 TEST STRIPS) test strip Use as instructed 100 each 12  . HYDROcodone-acetaminophen (NORCO/VICODIN) 5-325 MG per tablet Take 1 tablet by mouth every 6 (six) hours as needed for moderate pain. 15 tablet 0  . lacosamide (VIMPAT) 200 MG TABS tablet Take 1 tablet (200 mg total) by mouth 2 (two) times daily. 180 tablet 1  . Lactase (DAIRY-RELIEF PO) Take 3 tablets by mouth 3 (three) times daily.     Marland Kitchen levETIRAcetam (KEPPRA) 750 MG tablet Take 2 tablets (1,500 mg total) by mouth 2 (two) times daily. 360 tablet 3  . mercaptopurine (PURINETHOL) 50 MG tablet Take 2 tablets (100 mg total) by mouth daily. Give on an empty stomach 1 hour before or 2 hours after meals.  Caution: Chemotherapy. 180 tablet 3  . metFORMIN (GLUCOPHAGE) 500 MG tablet TAKE 2 TABLETS EVERY EVENING 180 tablet 3  . methscopolamine (PAMINE FORTE) 5 MG tablet Take 1 tablet (5 mg total) by mouth 2 (two) times daily. 180 tablet 3  . metoprolol tartrate (LOPRESSOR) 25 MG tablet TAKE ONE-HALF (1/2) TABLET (12.5 MG) TWICE A DAY ) 90 tablet 3  . ONE TOUCH ULTRA TEST test strip USE AS INSTRUCTED 100 each 2  . ONETOUCH DELICA LANCETS 62E MISC USE AS INSTRUCTED (  NEEDS OFFICE VISIT) 100 each 0  . potassium chloride SA (KLOR-CON M20) 20 MEQ tablet Take 1 tablet (20 mEq total) by mouth 2 (two) times daily. 180 tablet 3  . saccharomyces boulardii (FLORASTOR) 250 MG capsule Take 250 mg by mouth daily.     . tamsulosin (FLOMAX) 0.4 MG CAPS Take 0.4 mg by mouth as needed.     . traMADol (ULTRAM) 50 MG tablet 1-2 every 4 hours as needed for cough or pain 40 tablet 0  . zoster vaccine live, PF, (ZOSTAVAX) 28768 UNT/0.65ML injection Inject 19,400 Units into the skin once. 1 each 0   No current facility-administered medications on file prior to visit.   Allergies  Allergen Reactions  . Azithromycin Swelling  . Dilaudid [Hydromorphone Hcl] Nausea And Vomiting  . Penicillins Hives  . Sulfonamide Derivatives Hives   Past Surgical History  Procedure Laterality Date  . Shoulder surgery    . Sinus surgery with instatrak    . Elbow surgery  2012    elbow MRSA infection   . Cataract extraction Bilateral   . Vasectomy    . Eye surgery    . Transthoracic echocardiogram  02/22/2011    EF 55-65%; increased pattern of LVH with mild conc hypertrophy, abnormal relaxation & increased filling pressure (grade 2 diastolic dysfunction); atrial septum thickened (lipomatous hypertrophy)   Family History  Problem Relation Age of Onset  . Colon polyps Father   . Heart disease Father     HTN, aneursym  . Stroke Father   . Hyperlipidemia Father   . Hypertension Father   . Heart disease Mother     CABG at age 69  .  Stroke Mother   . Hyperlipidemia Mother   . Hypertension Mother   . Colon cancer Neg Hx   . Stroke Paternal Grandmother   . Stroke Paternal Grandfather   . Other Child     tetrology of fallot (cornealia deland syndrome)   History   Social History  . Marital Status: Married    Spouse Name: [name]  . Number of Children: 2  . Years of Education: college   Occupational History  . DATA BASE ADMINISTRATOR [employer]   Social History Main Topics  . Smoking status: Never Smoker   . Smokeless tobacco: Never Used  . Alcohol Use: No  . Drug Use: No  . Sexual Activity: Yes    Birth Control/ Protection: Surgical     Comment: 1 partner in last 12 months   Other Topics Concern  . Not on file   Social History Narrative   Patient is married [name]) and lives at home with his family.   Patient has two children.   Patient has a college education.   Patient is right-handed.   Patient drinks one cup of soda daily.     Review of Systems  Gastrointestinal: Positive for abdominal pain.  All other systems reviewed and are negative.      Objective:   Physical Exam  Constitutional: He appears well-developed and well-nourished.  HENT:  Head: Normocephalic and atraumatic.  Eyes: Right eye exhibits no discharge. Left eye exhibits no discharge.  Neck: Neck supple. No tracheal deviation present.  Cardiovascular: Normal rate.   Pulmonary/Chest: Effort normal. No respiratory distress.  Abdominal: He exhibits no distension.  Abdomen is diffusely tender with light palpation. He does have active bowel sounds. There is no HSM or mass palpable  Neurological: He is alert.  Mini-Mental Status today reveals a score of 25  out of 30.  Skin: Skin is warm and dry.  Psychiatric: He has a normal mood and affect. His behavior is normal.  Nursing note and vitals reviewed.  Results for orders placed or performed in visit on 08/28/14  POCT glycosylated hemoglobin (Hb A1C)  Result Value Ref Range    Hemoglobin A1C 5.9     Filed Vitals:   09/13/14 1838  BP: 110/62  Pulse: 95  Temp: 97.8 F (36.6 C)  TempSrc: Oral  Resp: 16  Height: 5' 11"  (1.803 m)  Weight: 227 lb 4 oz (103.08 kg)  SpO2: 96%      Results for orders placed or performed in visit on 09/13/14  POCT CBC  Result Value Ref Range   WBC 6.9 4.6 - 10.2 K/uL   Lymph, poc 1.6 0.6 - 3.4   POC LYMPH PERCENT 23.7 10 - 50 %L   MID (cbc) 0.8 0 - 0.9   POC MID % 10.9 0 - 12 %M   POC Granulocyte 4.5 2 - 6.9   Granulocyte percent 65.4 37 - 80 %G   RBC 3.98 (A) 4.69 - 6.13 M/uL   Hemoglobin 13.6 (A) 14.1 - 18.1 g/dL   HCT, POC 41.7 (A) 43.5 - 53.7 %   MCV 104.7 (A) 80 - 97 fL   MCH, POC 34.2 (A) 27 - 31.2 pg   MCHC 32.7 31.8 - 35.4 g/dL   RDW, POC 18.8 %   Platelet Count, POC 173 142 - 424 K/uL   MPV 7.4 0 - 99.8 fL      Assessment & Plan:  Patient is clearly having difficulty concentrating and performing simple tasks. He also has a flareup of his Crohn's disease. He's done better with his diabetes lately.  Patient is definitely disabled from a mental status standpoint as well as from the Crohn's disease. We find that if he remains on the highest doses of 80 seizure medicine (if he lowers them, his seizures worsen) sees then his mental status is affected.  We will resubmit disability forms this week to [place], his insurance to verify his continued disabled state.  Sign [name] [name] M.D.

## 2014-09-13 NOTE — Progress Notes (Addendum)
Subjective:  This chart was scribed for [name] Haber, MD by Molli Posey, Medical scribe. This patient was seen in ROOM 1 and the patient's care was started 6:48 PM.   Patient ID: [name] [name], male    DOB: May 03, 1961, 54 y.o.   MRN: [id number] Chief Complaint  Patient presents with   Follow-up   HPI HPI Comments: [name] [name] is a 54 y.o. male with a history of HTN, Crohn's disease, seizures and DM who presents to [facility] for a follow up visit for his memory loss. Pt reports that he has an appointment in June with neurology. He says that he failed the memory test his last visit to the neurologist. Pt states that he did not remember how to get home a few weeks ago. He states his most recent A1C was 5.9. He says that has is walking every day recently. He reports no alleviating or modifying factors at this time.   Pt also reports that he had a Crohn's attack this morning and reports associated generalized abdominal tenderness at this time.   In general, [name] suffers confusion and fogginess almost every day to some degree. Some days it is mild wall other days it's quite severe. He will typically assess and questions multiple times a day and not just because he can't remember the answer but because he doesn't even remember ever being asked the question. He'll sometimes start conversations over that he had had the day before area he is dizzy and has balance issues almost every day. He has trouble grasping items and hauling things on a daily basis.  On March 2 [name] walked to the shopping center near the house. He got a couple things from the store but then got really confused and trying to check out. He had to get some help with a self checkout. The confusion continued and [name] had to hang around the gross over several minutes before he could remember exactly what he was doing and how to get back home.  On March 15, patient woke up early to go to throat. He was totally confused and had no idea what  year or month it was. He was not sure about with the election was and who was running. He did not recognize his cell phone, hisf, or the new car. He didn't recognize several pictures on the refrigerator from the 2016 home calendar. He did recognize a picture of his daughter from her junior high, but did not know that she was a Tree surgeon and did not know the school she is attending currently.  March 19 patient awoke from a brief afternoon nap confused because he had it. He had trouble with balance and felt like he was going to throw up the rest the evening until he went to bed.   Past Medical History  Diagnosis Date   Hypertension    GERD (gastroesophageal reflux disease)    Adenomatous colon polyp 10/1999   Osteoporosis    External hemorrhoids    Crohn's disease of small and large intestines 1999   Pneumonia    Esophageal stricture    MRSA (methicillin resistant Staphylococcus aureus) 10/2010    Diabetes mellitus    Hyperlipemia    Cancer     skin   Kidney stones    Sleep apnea     CPAP machine   PONV (postoperative nausea and vomiting)    Complex partial seizure    Migraines    Seizures    Cataract  History of nuclear stress test 11/15/2010    exercise myoview; normal pattern of perfusion; normal, low risk study    Skin cancer    Patient Active Problem List   Diagnosis Date Noted   Seizure 04/04/2013   Chest pain 03/12/2013   Abnormal EKG 03/12/2013   DOE (dyspnea on exertion) 03/12/2013   Obstructive sleep apnea 10/01/2012   Complex partial seizure 04/19/2012   DM2 (diabetes mellitus, type 2) 09/25/2011   Altered mental status 09/25/2011   OTHER DYSPHAGIA 03/27/2009   TRANSAMINASES, SERUM, ELEVATED 03/27/2009   Cough 11/10/2008   GERD 12/07/2007   COLONIC POLYPS, ADENOMATOUS, HX OF 12/07/2007   EXTERNAL HEMORRHOIDS 09/11/2007   CROHN'S DISEASE, LARGE AND SMALL INTESTINES 09/11/2007   OSTEOPOROSIS 09/11/2007    HYPERLIPIDEMIA NEC/NOS 02/27/2007   HYPERTENSION, ESSENTIAL NOS 02/27/2007   Current Outpatient Prescriptions on File Prior to Visit  Medication Sig Dispense Refill   ASACOL HD 800 MG TBEC      aspirin 81 MG tablet Take 81 mg by mouth daily.     CALCIUM PO Take 1 tablet by mouth 3 (three) times daily.      Cyanocobalamin (VITAMIN B 12 PO) Take 1 tablet by mouth as needed.      diltiazem (TIAZAC) 180 MG 24 hr capsule Take 1 capsule (180 mg total) by mouth daily. 90 capsule 3   eletriptan (RELPAX) 40 MG tablet Take 1 tablet at onset of headache, may repeat in 2 hours if headache persists or reoccurs. 12 tablet 4   EPINEPHrine (EPIPEN) 0.3 mg/0.3 mL DEVI Inject 0.3 mLs (0.3 mg total) into the muscle once. 1 Device 1   esomeprazole (NEXIUM) 40 MG capsule TAKE 1 CAPSULE TWICE A DAY 30 TO 60 MINUTES BEFORE YOU FIRST AND LAST MEALS OF THE DAY 180 capsule 0   fluticasone (FLONASE) 50 MCG/ACT nasal spray Place 2 sprays into both nostrils daily. 48 g 3   folic acid (FOLVITE) 1 MG tablet Take 2 tablets (2 mg total) by mouth daily. 180 tablet 3   glimepiride (AMARYL) 2 MG tablet TAKE 1 TABLET DAILY BEFORE BREAKFAST 90 tablet 0   glucosamine-chondroitin 500-400 MG tablet Take 2 tablets by mouth daily.     glucose blood (CHOICE DM FORA G20 TEST STRIPS) test strip Use as instructed 100 each 12   HYDROcodone-acetaminophen (NORCO/VICODIN) 5-325 MG per tablet Take 1 tablet by mouth every 6 (six) hours as needed for moderate pain. 15 tablet 0   lacosamide (VIMPAT) 200 MG TABS tablet Take 1 tablet (200 mg total) by mouth 2 (two) times daily. 180 tablet 1   Lactase (DAIRY-RELIEF PO) Take 3 tablets by mouth 3 (three) times daily.      levETIRAcetam (KEPPRA) 750 MG tablet Take 2 tablets (1,500 mg total) by mouth 2 (two) times daily. 360 tablet 3   mercaptopurine (PURINETHOL) 50 MG tablet Take 2 tablets (100 mg total) by mouth daily. Give on an empty stomach 1 hour before or 2 hours after meals.  Caution: Chemotherapy. 180 tablet 3   metFORMIN (GLUCOPHAGE) 500 MG tablet TAKE 2 TABLETS EVERY EVENING 180 tablet 3   methscopolamine (PAMINE FORTE) 5 MG tablet Take 1 tablet (5 mg total) by mouth 2 (two) times daily. 180 tablet 3   metoprolol tartrate (LOPRESSOR) 25 MG tablet TAKE ONE-HALF (1/2) TABLET (12.5 MG) TWICE A DAY ) 90 tablet 3   ONE TOUCH ULTRA TEST test strip USE AS INSTRUCTED 100 each 2   ONETOUCH DELICA LANCETS 67E MISC USE AS  INSTRUCTED (NEEDS OFFICE VISIT) 100 each 0   potassium chloride SA (KLOR-CON M20) 20 MEQ tablet Take 1 tablet (20 mEq total) by mouth 2 (two) times daily. 180 tablet 3   saccharomyces boulardii (FLORASTOR) 250 MG capsule Take 250 mg by mouth daily.      tamsulosin (FLOMAX) 0.4 MG CAPS Take 0.4 mg by mouth as needed.      traMADol (ULTRAM) 50 MG tablet 1-2 every 4 hours as needed for cough or pain 40 tablet 0   zoster vaccine live, PF, (ZOSTAVAX) 81448 UNT/0.65ML injection Inject 19,400 Units into the skin once. 1 each 0   No current facility-administered medications on file prior to visit.   Allergies  Allergen Reactions   Azithromycin Swelling   Dilaudid [Hydromorphone Hcl] Nausea And Vomiting   Penicillins Hives   Sulfonamide Derivatives Hives   Past Surgical History  Procedure Laterality Date   Shoulder surgery     Sinus surgery with instatrak     Elbow surgery  2012    elbow MRSA infection    Cataract extraction Bilateral    Vasectomy     Eye surgery     Transthoracic echocardiogram  02/22/2011    EF 55-65%; increased pattern of LVH with mild conc hypertrophy, abnormal relaxation & increased filling pressure (grade 2 diastolic dysfunction); atrial septum thickened (lipomatous hypertrophy)   Family History  Problem Relation Age of Onset   Colon polyps Father    Heart disease Father     HTN, aneursym   Stroke Father    Hyperlipidemia Father    Hypertension Father    Heart disease Mother     CABG at age 16    Stroke Mother    Hyperlipidemia Mother    Hypertension Mother    Colon cancer Neg Hx    Stroke Paternal Grandmother    Stroke Paternal Grandfather    Other Child     tetrology of fallot (cornealia deland syndrome)   History   Social History   Marital Status: Married    Spouse Name: [name]   Number of Children: 2   Years of Education: college   Occupational History   DATA BASE ADMINISTRATOR [employer]   Social History Main Topics   Smoking status: Never Smoker    Smokeless tobacco: Never Used   Alcohol Use: No   Drug Use: No   Sexual Activity: Yes    Birth Control/ Protection: Surgical     Comment: 1 partner in last 12 months   Other Topics Concern   Not on file   Social History Narrative   Patient is married [name]) and lives at home with his family.   Patient has two children.   Patient has a college education.   Patient is right-handed.   Patient drinks one cup of soda daily.     Review of Systems  Gastrointestinal: Positive for abdominal pain.  All other systems reviewed and are negative.      Objective:   Physical Exam  Constitutional: He appears well-developed and well-nourished.  HENT:  Head: Normocephalic and atraumatic.  Eyes: Right eye exhibits no discharge. Left eye exhibits no discharge.  Neck: Neck supple. No tracheal deviation present.  Cardiovascular: Normal rate.   Pulmonary/Chest: Effort normal. No respiratory distress.  Abdominal: He exhibits no distension.  Abdomen is diffusely tender with light palpation. He does have active bowel sounds. There is no HSM or mass palpable  Neurological: He is alert.  Mini-Mental Status today reveals a score of  25 out of 30.  Skin: Skin is warm and dry.  Psychiatric: He has a normal mood and affect. His behavior is normal.  Nursing note and vitals reviewed.  Results for orders placed or performed in visit on 08/28/14  POCT glycosylated hemoglobin (Hb A1C)  Result Value Ref Range    Hemoglobin A1C 5.9     Filed Vitals:   09/13/14 1838  BP: 110/62  Pulse: 95  Temp: 97.8 F (36.6 C)  TempSrc: Oral  Resp: 16  Height: 5' 11"  (1.803 m)  Weight: 227 lb 4 oz (103.08 kg)  SpO2: 96%      Results for orders placed or performed in visit on 09/13/14  POCT CBC  Result Value Ref Range   WBC 6.9 4.6 - 10.2 K/uL   Lymph, poc 1.6 0.6 - 3.4   POC LYMPH PERCENT 23.7 10 - 50 %L   MID (cbc) 0.8 0 - 0.9   POC MID % 10.9 0 - 12 %M   POC Granulocyte 4.5 2 - 6.9   Granulocyte percent 65.4 37 - 80 %G   RBC 3.98 (A) 4.69 - 6.13 M/uL   Hemoglobin 13.6 (A) 14.1 - 18.1 g/dL   HCT, POC 41.7 (A) 43.5 - 53.7 %   MCV 104.7 (A) 80 - 97 fL   MCH, POC 34.2 (A) 27 - 31.2 pg   MCHC 32.7 31.8 - 35.4 g/dL   RDW, POC 18.8 %   Platelet Count, POC 173 142 - 424 K/uL   MPV 7.4 0 - 99.8 fL      Assessment & Plan:  Patient is clearly having difficulty concentrating and performing simple tasks. He also has a flareup of his Crohn's disease. He's done better with his diabetes lately.  Patient is definitely disabled from a mental status standpoint as well as from the Crohn's disease. We find that if he remains on the highest doses of 80 seizure medicine (if he lowers them, his seizures worsen) sees then his mental status is affected.  We will resubmit disability forms this week to [place], his insurance to verify his continued disabled state.  Sign [name] [name] M.D.

## 2014-09-14 ENCOUNTER — Telehealth: Payer: Self-pay

## 2014-09-14 NOTE — Telephone Encounter (Signed)
FYI for Dr. Stoney Bang out [facility] Attending Physician Statement Based on office notes, faxed these to [facility] along with office notes and med list.  Contacted Dr. Marcial Pacas, Md's Office, and spoke with [facility], advised her that Pt is needing to have FMLA paper work filled out at their office as well. She advised me to fax over the information to [phone]; ATT: Dr. Krista Blue

## 2014-09-15 ENCOUNTER — Telehealth: Payer: Self-pay | Admitting: *Deleted

## 2014-09-15 NOTE — Telephone Encounter (Signed)
LMVM for pt on work # to call back about setting up appt for cpap compliance.

## 2014-09-16 NOTE — Progress Notes (Signed)
I have reviewed and agreed above plan. 

## 2014-09-18 ENCOUNTER — Other Ambulatory Visit: Payer: Self-pay | Admitting: Family Medicine

## 2014-09-18 ENCOUNTER — Other Ambulatory Visit: Payer: Self-pay | Admitting: Gastroenterology

## 2014-09-20 ENCOUNTER — Telehealth: Payer: Self-pay

## 2014-09-20 NOTE — Telephone Encounter (Signed)
Called patient to remind him to bring in his CPAP to tomorrow's office visit for download. No answer.

## 2014-09-20 NOTE — Telephone Encounter (Signed)
Pt has appt 09-21-14 with CM/NP.

## 2014-09-21 ENCOUNTER — Ambulatory Visit (INDEPENDENT_AMBULATORY_CARE_PROVIDER_SITE_OTHER): Payer: 59 | Admitting: Nurse Practitioner

## 2014-09-21 ENCOUNTER — Encounter: Payer: Self-pay | Admitting: Nurse Practitioner

## 2014-09-21 ENCOUNTER — Telehealth: Payer: Self-pay | Admitting: *Deleted

## 2014-09-21 VITALS — BP 114/74 | HR 78

## 2014-09-21 DIAGNOSIS — Z5181 Encounter for therapeutic drug level monitoring: Secondary | ICD-10-CM

## 2014-09-21 DIAGNOSIS — G4733 Obstructive sleep apnea (adult) (pediatric): Secondary | ICD-10-CM

## 2014-09-21 DIAGNOSIS — G40209 Localization-related (focal) (partial) symptomatic epilepsy and epileptic syndromes with complex partial seizures, not intractable, without status epilepticus: Secondary | ICD-10-CM

## 2014-09-21 DIAGNOSIS — R569 Unspecified convulsions: Secondary | ICD-10-CM

## 2014-09-21 NOTE — Progress Notes (Signed)
[facility]  PATIENT: [name] [name] DOB: 03-11-1961   REASON FOR VISIT: follow up for worsening seizure disorder, confusion and dizziness,  memory lapses HISTORY FROM:patient, wife    HISTORY OF PRESENT ILLNESS:[name] [name] is a 54 year old male with a history of OSA and Seizures. He returns today for follow-up. He was last seen in this office by Ward Givens NP on 07/12/2014. At that time he was having 2-3 seizures since his previous visit in November 2015. He is currently taking Keppra and Vimpat and tolerating it well. He states today that he is having 4-5 seizures per week which are brief. He is currently not working due to his inability to focus and confusion, and increased seizure activity. He has obstructive sleep apnea and is on CPAP. He did not bring his chip in  today but he claims he is compliant. His coworkers and wife have witnessed these seizure events. Overall he feels that his seizures have worsened.  The patient reports that his memory has worsened .He states that at time he will get names mixed up. He will often have word finding issues. Last MMSE 26/30.    HISTORY 05/09/14: [name] [name] is a 54 year old male with a history of OSA and Seizures. He returns today for follow-up. He is currently taking Keppra and Vimpat and tolerating it well. He has staring episodes. He has 3-4 episodes a month. He has episodes where he regressing to his past and will sometimes not recognize his wife. He states that his seizures are not as frequent as they were. He does not operate a motor vehicle. His wife drives him wherever he needs to go He is able to complete all ADLs independently. He continues to notice some changes with his memory. His wife reports that he will ask the same questions repeatedly. No new neurological complaints.   REVIEW OF SYSTEMS: Full 14 system review of systems performed and notable only for those listed, all others are neg:  Constitutional: neg    Cardiovascular: neg Ear/Nose/Throat: neg  Skin: neg Eyes: neg Respiratory: neg Gastroitestinal: Abdominal pain, rectal bleeding, diarrhea Hematology/Lymphatic: neg  Endocrine: neg Musculoskeletal:neg Allergy/Immunology: neg Neurological: Memory loss, dizziness, headache, seizure Psychiatric: Agitation, confusion, decreased concentration Sleep : neg   ALLERGIES: Allergies  Allergen Reactions  . Azithromycin Swelling  . Dilaudid [Hydromorphone Hcl] Nausea And Vomiting  . Penicillins Hives  . Sulfonamide Derivatives Hives    HOME MEDICATIONS: Outpatient Prescriptions Prior to Visit  Medication Sig Dispense Refill  . aspirin 81 MG tablet Take 81 mg by mouth daily.    Marland Kitchen CALCIUM PO Take 1 tablet by mouth 3 (three) times daily.     . Cyanocobalamin (VITAMIN B 12 PO) Take 1 tablet by mouth as needed.     . diltiazem (TIAZAC) 180 MG 24 hr capsule Take 1 capsule (180 mg total) by mouth daily. 90 capsule 3  . eletriptan (RELPAX) 40 MG tablet Take 1 tablet at onset of headache, may repeat in 2 hours if headache persists or reoccurs. 12 tablet 4  . EPINEPHrine (EPIPEN) 0.3 mg/0.3 mL DEVI Inject 0.3 mLs (0.3 mg total) into the muscle once. 1 Device 1  . esomeprazole (NEXIUM) 40 MG capsule TAKE 1 CAPSULE TWICE A DAY 30 TO 60 MINUTES BEFORE YOU FIRST AND LAST MEALS OF THE DAY 180 capsule 0  . fluticasone (FLONASE) 50 MCG/ACT nasal spray Place 2 sprays into both nostrils daily. 48 g 3  . folic acid (FOLVITE) 1 MG tablet  Take 2 tablets (2 mg total) by mouth daily. 180 tablet 3  . glimepiride (AMARYL) 2 MG tablet TAKE 1 TABLET DAILY BEFORE BREAKFAST 90 tablet 0  . glucose blood (CHOICE DM FORA G20 TEST STRIPS) test strip Use as instructed 100 each 12  . lacosamide (VIMPAT) 200 MG TABS tablet Take 1 tablet (200 mg total) by mouth 2 (two) times daily. 180 tablet 1  . Lactase (DAIRY-RELIEF PO) Take 3 tablets by mouth 3 (three) times daily.     Marland Kitchen levETIRAcetam (KEPPRA) 750 MG tablet Take 2 tablets  (1,500 mg total) by mouth 2 (two) times daily. 360 tablet 3  . mercaptopurine (PURINETHOL) 50 MG tablet Take 2 tablets (100 mg total) by mouth daily. Give on an empty stomach 1 hour before or 2 hours after meals. Caution: Chemotherapy. 180 tablet 3  . metFORMIN (GLUCOPHAGE) 500 MG tablet TAKE 2 TABLETS EVERY EVENING 180 tablet 3  . methscopolamine (PAMINE FORTE) 5 MG tablet Take 1 tablet (5 mg total) by mouth 2 (two) times daily. 180 tablet 3  . metoprolol tartrate (LOPRESSOR) 25 MG tablet TAKE ONE-HALF (1/2) TABLET (12.5 MG) TWICE A DAY ) 90 tablet 3  . ONE TOUCH ULTRA TEST test strip USE AS INSTRUCTED 100 each 2  . ONETOUCH DELICA LANCETS 15V MISC USE AS INSTRUCTED (NEEDS OFFICE VISIT) 100 each 0  . potassium chloride SA (KLOR-CON M20) 20 MEQ tablet Take 1 tablet (20 mEq total) by mouth 2 (two) times daily. 180 tablet 3  . saccharomyces boulardii (FLORASTOR) 250 MG capsule Take 250 mg by mouth daily.     . tamsulosin (FLOMAX) 0.4 MG CAPS Take 0.4 mg by mouth as needed.     . traMADol (ULTRAM) 50 MG tablet 1-2 every 4 hours as needed for cough or pain 40 tablet 0  . zoster vaccine live, PF, (ZOSTAVAX) 76160 UNT/0.65ML injection Inject 19,400 Units into the skin once. 1 each 0  . ASACOL HD 800 MG TBEC     . glucosamine-chondroitin 500-400 MG tablet Take 2 tablets by mouth daily.    Marland Kitchen HYDROcodone-acetaminophen (NORCO/VICODIN) 5-325 MG per tablet Take 1 tablet by mouth every 6 (six) hours as needed for moderate pain. (Patient not taking: Reported on 09/21/2014) 15 tablet 0   No facility-administered medications prior to visit.    PAST MEDICAL HISTORY: Past Medical History  Diagnosis Date  . Hypertension   . GERD (gastroesophageal reflux disease)   . Adenomatous colon polyp 10/1999  . Osteoporosis   . External hemorrhoids   . Crohn's disease of small and large intestines 1999  . Pneumonia   . Esophageal stricture   . MRSA (methicillin resistant Staphylococcus aureus) 10/2010   . Diabetes  mellitus   . Hyperlipemia   . Cancer     skin  . Kidney stones   . Sleep apnea     CPAP machine  . PONV (postoperative nausea and vomiting)   . Complex partial seizure   . Migraines   . Seizures   . Cataract   . History of nuclear stress test 11/15/2010    exercise myoview; normal pattern of perfusion; normal, low risk study   . Skin cancer     PAST SURGICAL HISTORY: Past Surgical History  Procedure Laterality Date  . Shoulder surgery    . Sinus surgery with instatrak    . Elbow surgery  2012    elbow MRSA infection   . Cataract extraction Bilateral   . Vasectomy    . Eye  surgery    . Transthoracic echocardiogram  02/22/2011    EF 55-65%; increased pattern of LVH with mild conc hypertrophy, abnormal relaxation & increased filling pressure (grade 2 diastolic dysfunction); atrial septum thickened (lipomatous hypertrophy)    FAMILY HISTORY: Family History  Problem Relation Age of Onset  . Colon polyps Father   . Heart disease Father     HTN, aneursym  . Stroke Father   . Hyperlipidemia Father   . Hypertension Father   . Heart disease Mother     CABG at age 48  . Stroke Mother   . Hyperlipidemia Mother   . Hypertension Mother   . Colon cancer Neg Hx   . Stroke Paternal Grandmother   . Stroke Paternal Grandfather   . Other Child     tetrology of fallot (cornealia deland syndrome)    SOCIAL HISTORY: History   Social History  . Marital Status: Married    Spouse Name: [name]  . Number of Children: 2  . Years of Education: college   Occupational History  . 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 Smoking status: Never Smoker   . Smokeless tobacco: Never Used  . Alcohol Use: No  . Drug Use: No  . Sexual Activity: Yes    Birth Control/ Protection: Surgical     Comment: 1 partner in last 12 months   Other Topics Concern  . Not on file   Social History Narrative   Patient is married [name]) and lives at home with his family.    Patient has two children.   Patient has a college education.   Patient is right-handed.   Patient drinks one cup of soda daily.     PHYSICAL EXAM  Filed Vitals:   09/21/14 0812  BP: 114/74  Pulse: 78   There is no weight on file to calculate BMI. Generalized: Well developed, in no acute distress   Neurological examination  Mentation: Alert oriented to time, place, history taking. Follows all commands speech and language fluent. MMSE not done last was  26/30 On 07/12/2014.  Cranial nerve II-XII: Pupils were equal round reactive to light. Extraocular movements were full, visual field were full on confrontational test. Facial sensation and strength were normal. Uvula tongue midline. Head turning and shoulder shrug were normal and symmetric. Motor: The motor testing reveals 5 over 5 strength of all 4 extremities. Good symmetric motor tone is noted throughout.  Sensory: Sensory testing is intact to soft touch on all 4 extremities. No evidence of extinction is noted.  Coordination: Cerebellar testing reveals good finger-nose-finger and heel-to-shin bilaterally.  Gait and station: Gait is normal. Tandem gait is normal. Romberg is negative. No drift is seen.  Reflexes: Deep tendon reflexes are symmetric and normal bilaterally.  Marland Kitchen  DIAGNOSTIC DATA (LABS, IMAGING, TESTING) - I reviewed patient records, labs, notes, testing and imaging myself where available.  Lab Results  Component Value Date   WBC 6.9 09/13/2014   HGB 13.6* 09/13/2014   HCT 41.7* 09/13/2014   MCV 104.7* 09/13/2014   PLT CANCELED 07/07/2014      Component Value Date/Time   NA 134* 07/07/2014 1612   K 4.0 07/07/2014 1612   CL 101 07/07/2014 1612   CO2 23 07/07/2014 1612   GLUCOSE 75 07/07/2014 1612   BUN 21 07/07/2014 1612   CREATININE 0.83 07/07/2014 1612   CREATININE 0.72 01/04/2013 2155   CALCIUM 9.8 07/07/2014 1612   PROT 7.3 07/07/2014 1612  ALBUMIN 4.4 07/07/2014 1612   AST 35 07/07/2014 1612    ALT 33 07/07/2014 1612   ALKPHOS 106 07/07/2014 1612   BILITOT 1.9* 07/07/2014 1612   GFRNONAA >89 07/07/2014 1612   GFRNONAA >90 01/04/2013 2155   GFRAA >89 07/07/2014 1612   GFRAA >90 01/04/2013 2155   Lab Results  Component Value Date   CHOL 158 07/07/2014   HDL 43 07/07/2014   LDLCALC 81 07/07/2014   TRIG 170* 07/07/2014   CHOLHDL 3.7 07/07/2014   Lab Results  Component Value Date   HGBA1C 5.9 08/28/2014    ASSESSMENT AND PLAN  54 y.o. year old male  has a past medical history of Hypertension; GERD (gastroesophageal reflux disease); Adenomatous colon polyp (10/1999); Osteoporosis; External hemorrhoids; Crohn's disease of small and large intestines (1999); Esophageal stricture; MRSA (methicillin resistant Staphylococcus aureus) (10/2010 ); Diabetes mellitus; Hyperlipemia; Cancer; Kidney stones; Sleep apnea;  Complex partial seizure; Migraines;   Here to follow-up. Reviewed recent labs from January and March 2016. The patient is a current patient of Dr. Krista Blue  who is out of the office today . This note is sent to the work in doctor.    Discussed with Dr. Brett Fairy work in doctor Will check levels of [place] and Vimpat today and adjust if necessary Will set up for referral to epilepsy unit at [facility] to be evaluated F/U after epilepsy testing with Dr. Luan Pulling, [facility], [facility], [facility] [address] [place], [place] [address] [phone]

## 2014-09-21 NOTE — Telephone Encounter (Signed)
Form,[employer] sent to [facility] and Dr Dohmeier 09-21-14.

## 2014-09-21 NOTE — Patient Instructions (Signed)
Will check levels of Keppra and Vimpat today and adjust if necessary Will set up for referral to epilepsy unit at [facility] F/U after epilepsy testing with MD

## 2014-09-21 NOTE — Progress Notes (Signed)
I agree with the assessment and plan as directed by NP .The patient is known to me .   Roseanna Koplin, MD  

## 2014-09-22 DIAGNOSIS — Z0289 Encounter for other administrative examinations: Secondary | ICD-10-CM

## 2014-09-24 LAB — LACOSAMIDE: Lacosamide: 9.9 ug/mL (ref 5.0–10.0)

## 2014-09-24 LAB — LEVETIRACETAM LEVEL: Levetiracetam Lvl: 18.6 ug/mL (ref 10.0–40.0)

## 2014-09-27 ENCOUNTER — Encounter: Payer: Self-pay | Admitting: Nurse Practitioner

## 2014-09-27 NOTE — Telephone Encounter (Signed)
Form filled out and given to MD to sign.

## 2014-09-28 ENCOUNTER — Encounter: Payer: Self-pay | Admitting: Neurology

## 2014-09-28 NOTE — Telephone Encounter (Signed)
[name] [name]: We will do please check on his appointment with [facility]

## 2014-09-28 NOTE — Telephone Encounter (Signed)
Form,[employer] received,completed by Dr Krista Blue faxed 09/28/14.

## 2014-09-28 NOTE — Telephone Encounter (Addendum)
Patient's spouse questioning when will forms be faxed.  Please call [phone] and advise.   The form is signed, please notify patient.  Marcial Pacas, M.D. Ph.D.  [facility] [place], [place] [address] Phone: [phone] Fax:      [phone]

## 2014-09-29 ENCOUNTER — Ambulatory Visit: Payer: 59 | Admitting: Family Medicine

## 2014-10-03 ENCOUNTER — Telehealth: Payer: Self-pay

## 2014-10-03 ENCOUNTER — Telehealth: Payer: Self-pay | Admitting: Family Medicine

## 2014-10-03 NOTE — Telephone Encounter (Signed)
-----   Message from Otilio Jefferson, NP sent at 10/03/2014 10:05 AM EDT ----- Vimpat level good also. Glad I scheduled follow up at epilepsy unit.

## 2014-10-03 NOTE — Telephone Encounter (Signed)
Spoke to patient. Gave lab results. Patient verbalized understanding.

## 2014-10-03 NOTE — Telephone Encounter (Signed)
Mistakenly sent this message to clinical message pool. Should have gone FMLA pool. Clinical please disregard.

## 2014-10-03 NOTE — Telephone Encounter (Signed)
[employer] faxed a return to work form on patient to be completed by Dr. Joseph Art. Patient is unable to return on 11/24/2014 as originally inteded. Can we confirm that patient's condition is permanent and this is a long term disability? Form is in your box waiting to be reviewed and signed. Please return to FMLA/Disability department upon completion. Deadline to turn in is 10/06/2014.  Thanks, [employer]

## 2014-10-06 ENCOUNTER — Encounter: Payer: Self-pay | Admitting: Family Medicine

## 2014-10-06 ENCOUNTER — Ambulatory Visit (INDEPENDENT_AMBULATORY_CARE_PROVIDER_SITE_OTHER): Payer: 59 | Admitting: Family Medicine

## 2014-10-06 VITALS — BP 110/64 | HR 74 | Temp 98.2°F | Resp 16 | Ht 70.5 in | Wt 224.8 lb

## 2014-10-06 DIAGNOSIS — G40909 Epilepsy, unspecified, not intractable, without status epilepticus: Secondary | ICD-10-CM

## 2014-10-06 DIAGNOSIS — R410 Disorientation, unspecified: Secondary | ICD-10-CM

## 2014-10-06 DIAGNOSIS — D638 Anemia in other chronic diseases classified elsewhere: Secondary | ICD-10-CM

## 2014-10-06 DIAGNOSIS — E119 Type 2 diabetes mellitus without complications: Secondary | ICD-10-CM

## 2014-10-06 DIAGNOSIS — F05 Delirium due to known physiological condition: Secondary | ICD-10-CM | POA: Diagnosis not present

## 2014-10-06 DIAGNOSIS — R700 Elevated erythrocyte sedimentation rate: Secondary | ICD-10-CM

## 2014-10-06 NOTE — Progress Notes (Signed)
Needs disability coverage.  Has short term coverage through next week, but needs extension.  Issues are:  1. Diabetes:  Better control last 2 weeks, able to walk daily, blood sugars 100-140 lately, last hgb A1C was 5.9 on March 6th.  2. Mental status changes:  Foggy headed with confusion intermittently during the night, occas during day  3. Seizure disorder:  Needs notes from neurology  Objective:  Alert,  BP 110/64 mmHg  Pulse 74  Temp(Src) 98.2 F (36.8 C) (Oral)  Resp 16  Ht 5' 10.5" (1.791 m)  Wt 224 lb 12.8 oz (101.969 kg)  BMI 31.79 kg/m2  SpO2 97%   Wt Readings from Last 3 Encounters:  10/06/14 224 lb 12.8 oz (101.969 kg)  09/13/14 227 lb 4 oz (103.08 kg)  09/04/14 227 lb (102.967 kg)   Heart:  Reg, no murmur Chest:  Clear Skin:  Seborrheic changes on face. Neuro:  Clearly frustrated.  Able to communicate.  Moving 4 extrem equally.  CN III-XII intact   Results for orders placed or performed in visit on 09/21/14  Levetiracetam level  Result Value Ref Range   Levetiracetam Lvl 18.6 10.0 - 40.0 ug/mL  Lacosamide  Result Value Ref Range   Lacosamide 9.9 5.0 - 10.0 ug/mL   POCT SEDIMENTATION RATE  Status: Finalresult Visible to patient:  MyChart Nextappt: 10/13/2014 at 12:45 PM in Family Medicine [name], MD) Dx:  Crohn disease, unspecified complication              Ref Range 3wk ago (09/13/14) 57yrago (12/09/12) 28yrgo (09/29/12) 3y784yro (09/29/11) 84yr32yr (08/06/11)    POCT SED RATE 0 - 22 mm/hr 34 (A) 20 32 (A) 36 (A) 30 (A)   Resulting Agency  [facility]      Specimen Collected: 09/13/14 9:08 PM Last Resulted: 09/13/14 9:08 PM                      Other Results from 09/13/2014         POCT CBC  Status: Finalresult Visible to patient:  MyChart Nextappt: 10/13/2014 at 12:45 PM in Family Medicine (LAURobyn Haber) Dx:  Crohn disease, unspecified complication              Ref Range 3wk ago (09/13/14) 15mo 60mo(07/20/14) 81mo a41mo1/14/16) 67yr ag74yr/8/15) 67yr ago48yr14/14)    WBC 4.6 - 10.2 K/uL 6.9 5.2 CANCELEDR, CM 4.8R 8.8R    Lymph, poc 0.6 - 3.4  1.6 1.6       POC LYMPH PERCENT 10 - 50 %L 23.7 30.6       MID (cbc) 0 - 0.9  0.8 0.7       POC MID % 0 - 12 %M 10.9 13.4 (A)       POC Granulocyte 2 - 6.9  4.5 2.9       Granulocyte percent 37 - 80 %G 65.4 56.0       RBC 4.69 - 6.13 M/uL 3.98 (A) 3.48 (A) CANCELEDR, CM 3.44 (L)R 4.34R    Hemoglobin 14.1 - 18.1 g/dL 13.6 (A) 11.5 (A) CANCELEDR, CM 12.1 (L)R 14.7R    HCT, POC 43.5 - 53.7 % 41.7 (A) 35.7 (A) CANCELEDR, CM 34.8 (L)R 42.7R    MCV 80 - 97 fL 104.7 (A) 102.6 (A) CANCELEDR, CM 101.2 (H)R 98.4R    MCH, POC 27 - 31.2 pg 34.2 (A) 33.0 (A) CANCELEDR, CM 35.2 (H)R 33.9R  MCHC 31.8 - 35.4 g/dL 32.7 32.1 CANCELEDR, CM 34.8R 34.4R    RDW, POC % 18.8 18.4       Platelet Count, POC 142 - 424 K/uL 173 144       MPV 0 - 99.8 fL 7.4 7.4 CANCELEDR, CM     Resulting Agency  [facility]      Specimen Collected: 09/13/14 7:58 PM Last Resulted: 09/13/14 7:58 PM             CM=Additional commentsR=Reference range differs from displayed range        Assessment:  Patient is unable to drive or function in any predictable capacity because of seizures and subacute confusional state which is intermittent.  Plan:  Update disability papers.     ICD-9-CM ICD-10-CM   1. Seizure disorder 345.90 G40.909   2. Subacute confusional state 293.1 F05   3. Type 2 diabetes mellitus, controlled 250.00 E11.9   4. Anemia, chronic disease 285.29 D63.8   5. Elevated sed rate 790.1 R70.0    Continue current medications  Signed, [name], MD

## 2014-10-13 ENCOUNTER — Ambulatory Visit: Payer: Self-pay | Admitting: Family Medicine

## 2014-10-17 ENCOUNTER — Telehealth: Payer: Self-pay | Admitting: Family Medicine

## 2014-10-17 NOTE — Telephone Encounter (Signed)
Patient's wife dropped off forms to be completed. States they are due by May 5th. These forms are in your box  [phone]

## 2014-10-18 NOTE — Telephone Encounter (Signed)
Can you fill out the forms as much as you can first?  I'll be in tomorrow.

## 2014-10-18 NOTE — Telephone Encounter (Signed)
Of course I can! Will do as much as possible.

## 2014-10-27 ENCOUNTER — Encounter: Payer: Self-pay | Admitting: Family Medicine

## 2014-10-27 ENCOUNTER — Ambulatory Visit (INDEPENDENT_AMBULATORY_CARE_PROVIDER_SITE_OTHER): Payer: 59 | Admitting: Family Medicine

## 2014-10-27 VITALS — BP 110/64 | HR 68 | Temp 98.0°F | Resp 16 | Ht 70.5 in | Wt 225.2 lb

## 2014-10-27 DIAGNOSIS — R2681 Unsteadiness on feet: Secondary | ICD-10-CM | POA: Diagnosis not present

## 2014-10-27 DIAGNOSIS — E119 Type 2 diabetes mellitus without complications: Secondary | ICD-10-CM | POA: Diagnosis not present

## 2014-10-27 DIAGNOSIS — R413 Other amnesia: Secondary | ICD-10-CM | POA: Diagnosis not present

## 2014-10-27 DIAGNOSIS — G40909 Epilepsy, unspecified, not intractable, without status epilepticus: Secondary | ICD-10-CM

## 2014-10-27 DIAGNOSIS — K50919 Crohn's disease, unspecified, with unspecified complications: Secondary | ICD-10-CM

## 2014-10-27 NOTE — Progress Notes (Signed)
Patient ID: [name] [name], male   DOB: Sep 10, 1960, 54 y.o.   MRN: [id number]   This chart was scribed for [name], MD by [facility], medical scribe at [facility].The patient was seen in exam room 25 and the patient's care was started at 4:13 PM.  Patient ID: Andersen Iorio MRN: [id number], DOB: 12-30-1960, 54 y.o. Date of Encounter: 10/27/2014  Primary Physician: [name], MD  Chief Complaint:  Chief Complaint  Patient presents with   Follow-up   for disability   HPI:  [name] [name] is a 54 y.o. male who presents to [facility] for a follow up for mental confusion, seizures and falls. Pt needs a disability extension. His disability is through [facility] and [employer]. His short term disability is over in two weeks. He almost fell today and has had a fall recently. Pt states he is confused and he feels disconnected. Sleeping is ok. Pt has been having headaches. Today his neck has bothered him, he attributes this to stress.  Diabetes: Blood sugar has improved. His wife says he has only eats one meal. Weight has been steady over the last three visits. He wakes up during the middle of the night concerned about low blood sugar, but when he wakes up she does not recall this incident. His wife is concerned that he maybe having a seizures.  Chron's disease Pt complains of frequent cramps, mucous, high volume bowels. He does take probiotics. He cannot eat cheese or drink milk.  Past Medical History  Diagnosis Date   Hypertension    GERD (gastroesophageal reflux disease)    Adenomatous colon polyp 10/1999   Osteoporosis    External hemorrhoids    Crohn's disease of small and large intestines 1999   Pneumonia    Esophageal stricture    MRSA (methicillin resistant Staphylococcus aureus) 10/2010    Diabetes mellitus    Hyperlipemia    Cancer     skin   Kidney stones    Sleep apnea     CPAP machine   PONV (postoperative  nausea and vomiting)    Complex partial seizure    Migraines    Seizures    Cataract    History of nuclear stress test 11/15/2010    exercise myoview; normal pattern of perfusion; normal, low risk study    Skin cancer     Home Meds: Prior to Admission medications   Medication Sig Start Date End Date Taking? Authorizing Provider  ASACOL HD 800 MG TBEC  03/28/14  Yes Historical Provider, MD  aspirin 81 MG tablet Take 81 mg by mouth daily.   Yes Historical Provider, MD  CALCIUM PO Take 1 tablet by mouth 3 (three) times daily.    Yes Historical Provider, MD  Cyanocobalamin (VITAMIN B 12 PO) Take 1 tablet by mouth as needed.    Yes Historical Provider, MD  diltiazem (TIAZAC) 180 MG 24 hr capsule Take 1 capsule (180 mg total) by mouth daily. 07/07/14  Yes [name], MD  eletriptan (RELPAX) 40 MG tablet Take 1 tablet at onset of headache, may repeat in 2 hours if headache persists or reoccurs. 01/18/13  Yes Wendie Agreste, MD  EPINEPHrine (EPIPEN) 0.3 mg/0.3 mL DEVI Inject 0.3 mLs (0.3 mg total) into the muscle once. 02/26/12  Yes Wardell Honour, MD  esomeprazole (NEXIUM) 40 MG capsule TAKE 1 CAPSULE TWICE A DAY 30 TO 60 MINUTES BEFORE YOU FIRST AND LAST MEALS OF THE DAY  08/31/14  Yes Ladene Artist, MD  fluticasone (FLONASE) 50 MCG/ACT nasal spray Place 2 sprays into both nostrils daily. 07/07/14  Yes [name], MD  folic acid (FOLVITE) 1 MG tablet Take 2 tablets (2 mg total) by mouth daily. 07/07/14  Yes [name], MD  glimepiride (AMARYL) 2 MG tablet TAKE 1 TABLET DAILY BEFORE BREAKFAST 09/18/14  Yes [name], MD  glucosamine-chondroitin 500-400 MG tablet Take 2 tablets by mouth daily.   Yes Historical Provider, MD  HYDROcodone-acetaminophen (NORCO/VICODIN) 5-325 MG per tablet Take 1 tablet by mouth every 6 (six) hours as needed for moderate pain. 08/23/13  Yes Ryan M Dunn, PA-C  lacosamide (VIMPAT) 200 MG TABS tablet Take 1 tablet (200 mg total) by mouth 2 (two) times  daily. 05/18/14  Yes Megan P Millikan, NP  Lactase (DAIRY-RELIEF PO) Take 3 tablets by mouth 3 (three) times daily.    Yes Historical Provider, MD  levETIRAcetam (KEPPRA) 750 MG tablet Take 2 tablets (1,500 mg total) by mouth 2 (two) times daily. 05/09/14  Yes Megan Spero Curb, NP  mercaptopurine (PURINETHOL) 50 MG tablet Take 2 tablets (100 mg total) by mouth daily. Give on an empty stomach 1 hour before or 2 hours after meals. Caution: Chemotherapy. 10/14/13  Yes Ladene Artist, MD  Mesalamine (LIALDA PO) Take by mouth.   Yes Historical Provider, MD  metFORMIN (GLUCOPHAGE) 500 MG tablet TAKE 2 TABLETS EVERY EVENING 07/07/14  Yes [name], MD  methscopolamine (PAMINE FORTE) 5 MG tablet Take 1 tablet (5 mg total) by mouth 2 (two) times daily. 10/14/13  Yes Ladene Artist, MD  metoprolol tartrate (LOPRESSOR) 25 MG tablet TAKE ONE-HALF (1/2) TABLET (12.5 MG) TWICE A DAY ) 07/07/14  Yes [name], MD  [facility] ULTRA TEST strip USE AS INSTRUCTED   Yes Mancel Bale, PA-C  [facility] DELICA LANCETS 99B MISC USE AS INSTRUCTED (NEEDS OFFICE VISIT) 02/04/13  Yes Areta Haber Dunn, PA-C  potassium chloride SA (KLOR-CON M20) 20 MEQ tablet Take 1 tablet (20 mEq total) by mouth 2 (two) times daily. 07/07/14  Yes [name], MD  saccharomyces boulardii (FLORASTOR) 250 MG capsule Take 250 mg by mouth daily.    Yes Historical Provider, MD  tamsulosin (FLOMAX) 0.4 MG CAPS Take 0.4 mg by mouth as needed.    Yes Historical Provider, MD  traMADol (ULTRAM) 50 MG tablet 1-2 every 4 hours as needed for cough or pain 01/12/13  Yes Tanda Rockers, MD  zoster vaccine live, PF, (ZOSTAVAX) 71696 UNT/0.65ML injection Inject 19,400 Units into the skin once. 07/07/14  Yes [name], MD  glucose blood (CHOICE DM FORA G20 TEST STRIPS) test strip Use as instructed Patient not taking: Reported on 10/27/2014 11/13/12   [name], MD   Allergies:  Allergies  Allergen Reactions   Azithromycin Swelling   Dilaudid  [Hydromorphone Hcl] Nausea And Vomiting   Penicillins Hives   Sulfonamide Derivatives Hives   History   Social History   Marital Status: Married    Spouse Name: [name]   Number of Children: 2   Years of Education: college   Occupational History   DATA BASE ADMINISTRATOR [place]   Social History Main Topics   Smoking status: Never Smoker    Smokeless tobacco: Never Used   Alcohol Use: No   Drug Use: No   Sexual Activity: Yes    Birth Control/ Protection: Surgical     Comment: 1 partner in last 12 months   Other Topics  Concern   Not on file   Social History Narrative   Patient is married [name]) and lives at home with his family.   Patient has two children.   Patient has a college education.   Patient is right-handed.   Patient drinks one cup of soda daily.   Review of Systems: Constitutional: negative for chills, fever, night sweats, weight changes, or fatigue  HEENT: negative for vision changes, hearing loss, congestion, rhinorrhea, ST, epistaxis, or sinus pressure Cardiovascular: negative for chest pain or palpitations Respiratory: negative for hemoptysis, wheezing, shortness of breath, or cough Abdominal: negative for nausea, vomiting, diarrhea, or constipation. Positive for abdominal pain Dermatological: negative for rash Msk: Positive for neck pain, unstable gait Neurologic: negative for dizziness, or syncope. Positive for headache, seizures. All other systems reviewed and are otherwise negative with the exception to those above and in the HPI.  Physical Exam: Blood pressure 110/64, pulse 68, temperature 98 F (36.7 C), temperature source Oral, resp. rate 16, height 5' 10.5" (1.791 m), weight 225 lb 3.2 oz (102.15 kg), SpO2 97 %., Body mass index is 31.85 kg/(m^2). General: Well developed, well nourished, in no acute distress. Head: Normocephalic, atraumatic, eyes without discharge, sclera non-icteric, nares are without discharge. Bilateral auditory  canals clear, TM's are without perforation, pearly grey and translucent with reflective cone of light bilaterally. Oral cavity moist, posterior pharynx without exudate, erythema, peritonsillar abscess, or post nasal drip.  Neck: Supple. No thyromegaly. Full ROM. No lymphadenopathy. Lungs: Clear bilaterally to auscultation without wheezes, rales, or rhonchi. Breathing is unlabored. Heart: RRR with S1 S2. No murmurs, rubs, or gallops appreciated. Abdomen: Soft, non-tender, non-distended with normoactive bowel sounds. No hepatomegaly. No rebound/guarding. No obvious abdominal masses. Msk:  Strength and tone normal for age. Extremities/Skin: Warm and dry. No clubbing or cyanosis. No edema. No rashes or suspicious lesions. Neuro: Alert and oriented X 3. Moves all extremities spontaneously. Gait is normal. CNII-XII grossly in tact. Psych:  Responds to questions appropriately with a normal affect.   Results for orders placed or performed in visit on 09/21/14  Levetiracetam level  Result Value Ref Range   Levetiracetam Lvl 18.6 10.0 - 40.0 ug/mL  Lacosamide  Result Value Ref Range   Lacosamide 9.9 5.0 - 10.0 ug/mL  last sed rate 34 Lab Results  Component Value Date   HGBA1C 5.9 08/28/2014      ASSESSMENT AND PLAN:  54 y.o. year old male with the following problems  (This chart was scribed in my presence and reviewed by me personally.)    ICD-9-CM ICD-10-CM   1. Seizure disorder 345.90 G40.909   2. Crohn disease, unspecified complication 177.1 H65.790   3. Type 2 diabetes mellitus, controlled 250.00 E11.9   4. Short-term memory loss 780.93 R41.3   5. Unstable gait 781.2 R26.81    Your multiple significant and disabling conditions:  Seizure disorder, Crohn's disease, Confusion with falls and short term memory loss, and type 2 diabetes; make return to work inappropriate.  I do not expect you to make improvements beyond where you are now.  I think consultation with Dr. Fuller Plan will help  somewhat with the Crohn's disease, but this will not affect your total disability status we are seeing today and expect to continue indefinitely.  Signed, [name], MD  Signed, [name], MD 10/27/2014 4:23 PM

## 2014-10-27 NOTE — Patient Instructions (Signed)
Your multiple significant and disabling conditions:  Seizure disorder, Crohn's disease, Confusion with falls and short term memory loss, and type 2 diabetes; make return to work inappropriate.  I do not expect you to make improvements beyond where you are now.  I think consultation with Dr. Fuller Plan will help somewhat with the Crohn's disease, but this will not affect your total disability status we are seeing today and expect to continue indefinitely.

## 2014-11-03 ENCOUNTER — Telehealth: Payer: Self-pay | Admitting: Family Medicine

## 2014-11-03 ENCOUNTER — Telehealth: Payer: Self-pay | Admitting: Nurse Practitioner

## 2014-11-03 NOTE — Telephone Encounter (Signed)
Patient's wife dropped off long term disability forms to be completed by Dr. Carlean Jews. Forms were completed the same day and faxed the same day through [facility] and from the fax machine. This information is scanned under release ID # [id number]. Patient's wife also asked that we fax patient's last OV notes (10/27/2014) to [facility] as well for his short term disability purposes. That was also faxed the same day and is under the same release ID#. Long term claim number is: [id number] and short term claim number is [id number].

## 2014-11-03 NOTE — Telephone Encounter (Signed)
[name], pt's wife called wanting to speak with Lovey Newcomer regarding pt's disability claim. Please call and advise. [name] can be reached @ [phone]

## 2014-11-08 NOTE — Telephone Encounter (Signed)
I called and spoke to wife.   She needed the last ofv note 09-21-14 faxed to [facility], also relay that [facility] appt is 12-09-14 (3-5 day admission), as well as [facility] appt 01-11-15.  Done.  Successful transmission.

## 2014-11-08 NOTE — Telephone Encounter (Signed)
Patient's wife called and left VM stating that she is needing a short letter from Dr.L stating Khori's conditions, and that Dr.L does not believe that Kymoni will be returning to work. This letter needs to be faxed to [employer] at [phone]

## 2014-11-24 ENCOUNTER — Telehealth: Payer: Self-pay | Admitting: Gastroenterology

## 2014-11-24 MED ORDER — MESALAMINE 1.2 G PO TBEC
2.4000 g | DELAYED_RELEASE_TABLET | Freq: Every day | ORAL | Status: DC
Start: 2014-11-24 — End: 2015-01-02

## 2014-11-24 NOTE — Telephone Encounter (Signed)
Prescription sent to patient's pharmacy until scheduled appt.

## 2014-12-01 ENCOUNTER — Other Ambulatory Visit: Payer: Self-pay | Admitting: Gastroenterology

## 2014-12-06 ENCOUNTER — Other Ambulatory Visit: Payer: Self-pay | Admitting: Nurse Practitioner

## 2014-12-06 MED ORDER — LACOSAMIDE 200 MG PO TABS: 200.0000 mg | ORAL_TABLET | Freq: Two times a day (BID) | ORAL | Status: DC

## 2014-12-06 NOTE — Telephone Encounter (Signed)
Patient is calling in regard to Rx Vimpat 200 mg.  He needs a 90 day supply refill sent to [facility] if possible or a 30 day supply if he needs to come in first. He has an appointment scheduled for 7/20. Please call.

## 2014-12-06 NOTE — Telephone Encounter (Signed)
Rx signed and faxed.

## 2014-12-06 NOTE — Telephone Encounter (Signed)
90 Day Request entered, forwarded to provider for approval.  I called the patient back.  He is aware.  Says he has appt with Epilepsy clinic this Friday.

## 2014-12-14 DIAGNOSIS — R269 Unspecified abnormalities of gait and mobility: Secondary | ICD-10-CM | POA: Insufficient documentation

## 2014-12-15 ENCOUNTER — Other Ambulatory Visit: Payer: Self-pay | Admitting: Family Medicine

## 2015-01-02 ENCOUNTER — Telehealth: Payer: Self-pay | Admitting: Gastroenterology

## 2015-01-02 MED ORDER — MESALAMINE 1.2 G PO TBEC: 2.4000 g | DELAYED_RELEASE_TABLET | Freq: Every day | ORAL | Status: DC

## 2015-01-02 NOTE — Telephone Encounter (Signed)
Prescription sent to patient's pharmacy until scheduled appt.

## 2015-01-09 ENCOUNTER — Encounter: Payer: Self-pay | Admitting: Gastroenterology

## 2015-01-09 ENCOUNTER — Ambulatory Visit (INDEPENDENT_AMBULATORY_CARE_PROVIDER_SITE_OTHER): Payer: 59 | Admitting: Gastroenterology

## 2015-01-09 ENCOUNTER — Other Ambulatory Visit (INDEPENDENT_AMBULATORY_CARE_PROVIDER_SITE_OTHER): Payer: 59

## 2015-01-09 VITALS — BP 120/60 | HR 60 | Ht 70.5 in | Wt 224.5 lb

## 2015-01-09 DIAGNOSIS — E739 Lactose intolerance, unspecified: Secondary | ICD-10-CM

## 2015-01-09 DIAGNOSIS — K50819 Crohn's disease of both small and large intestine with unspecified complications: Secondary | ICD-10-CM

## 2015-01-09 DIAGNOSIS — R197 Diarrhea, unspecified: Secondary | ICD-10-CM

## 2015-01-09 DIAGNOSIS — K219 Gastro-esophageal reflux disease without esophagitis: Secondary | ICD-10-CM | POA: Diagnosis not present

## 2015-01-09 LAB — CBC WITH DIFFERENTIAL/PLATELET
Basophils Absolute: 0 K/uL (ref 0.0–0.1)
Basophils Relative: 0.3 % (ref 0.0–3.0)
Eosinophils Absolute: 0.2 K/uL (ref 0.0–0.7)
Eosinophils Relative: 2.7 % (ref 0.0–5.0)
HCT: 40.8 % (ref 39.0–52.0)
Hemoglobin: 13.9 g/dL (ref 13.0–17.0)
Lymphocytes Relative: 16.7 % (ref 12.0–46.0)
Lymphs Abs: 1.3 K/uL (ref 0.7–4.0)
MCHC: 34.1 g/dL (ref 30.0–36.0)
MCV: 109.6 fl — ABNORMAL HIGH (ref 78.0–100.0)
Monocytes Absolute: 0.8 K/uL (ref 0.1–1.0)
Monocytes Relative: 10.4 % (ref 3.0–12.0)
Neutro Abs: 5.3 K/uL (ref 1.4–7.7)
Neutrophils Relative %: 69.9 % (ref 43.0–77.0)
Platelets: 157 K/uL (ref 150.0–400.0)
RBC: 3.72 Mil/uL — ABNORMAL LOW (ref 4.22–5.81)
RDW: 16 % — ABNORMAL HIGH (ref 11.5–15.5)
WBC: 7.6 K/uL (ref 4.0–10.5)

## 2015-01-09 LAB — COMPREHENSIVE METABOLIC PANEL
ALBUMIN: 4.2 g/dL (ref 3.5–5.2)
ALK PHOS: 80 U/L (ref 39–117)
ALT: 27 U/L (ref 0–53)
AST: 33 U/L (ref 0–37)
BUN: 15 mg/dL (ref 6–23)
CALCIUM: 9.4 mg/dL (ref 8.4–10.5)
CHLORIDE: 108 meq/L (ref 96–112)
CO2: 24 meq/L (ref 19–32)
Creatinine, Ser: 0.85 mg/dL (ref 0.40–1.50)
GFR: 100.18 mL/min (ref >60.00)
Glucose, Bld: 105 mg/dL — ABNORMAL HIGH (ref 70–99)
Potassium: 3.7 mEq/L (ref 3.5–5.1)
Sodium: 141 mEq/L (ref 135–145)
Total Bilirubin: 2.7 mg/dL — ABNORMAL HIGH (ref 0.2–1.2)
Total Protein: 7.2 g/dL (ref 6.0–8.3)

## 2015-01-09 LAB — IGA: IgA: 352 mg/dL (ref 68–378)

## 2015-01-09 MED ORDER — ESOMEPRAZOLE MAGNESIUM 40 MG PO CPDR: DELAYED_RELEASE_CAPSULE | ORAL | Status: DC

## 2015-01-09 MED ORDER — METHSCOPOLAMINE BROMIDE 5 MG PO TABS: 5.0000 mg | ORAL_TABLET | Freq: Two times a day (BID) | ORAL | Status: DC

## 2015-01-09 MED ORDER — MERCAPTOPURINE 50 MG PO TABS: 100.0000 mg | ORAL_TABLET | Freq: Every day | ORAL | Status: DC

## 2015-01-09 MED ORDER — MESALAMINE 1.2 G PO TBEC: 2.4000 g | DELAYED_RELEASE_TABLET | Freq: Every day | ORAL | Status: DC

## 2015-01-09 NOTE — Patient Instructions (Addendum)
Your physician has requested that you go to the basement for the following lab work before leaving today:CBC, CMet, TTG and IGA.  We have sent the following prescriptions to your mail in pharmacy:purinethol, Nexium, Pamine, and Lialda.   If you have not heard from your mail in pharmacy within 1 week or if you have not received your medication in the mail, please contact us at [phone] so we may find out why.   You have been given a Fodmap diet to follow x 2 weeks, if you see no improvement in your symptoms then try the gluten free diet x 2 weeks.  Thank you for choosing me and [facility].  Pricilla Riffle. Dagoberto Ligas., MD., Marval Regal

## 2015-01-09 NOTE — Progress Notes (Signed)
    History of Present Illness: This is a 54 year old male with Crohn's ileocolitis and GERD returning for follow-up. He relates long-term problems with diarrhea occurring at least 3 times a day and sometimes more often. The symptoms are clearly exacerbated by milk products high fat foods and other dietary stressors but even avoiding these foods he has persistent complaints. He notes lower abdominal pain associated with worsening diarrhea. The symptoms are partially controlled with Pamine. Colonoscopy last year did not reveal any active colitis. He has intentionally lost weight over the past several years since he went on disability for partial complex seizures.   Current Medications, Allergies, Past Medical History, Past Surgical History, Family History and Social History were reviewed in [employer] record.  Physical Exam: General: Well developed , well nourished, no acute distress Head: Normocephalic and atraumatic Eyes:  sclerae anicteric, EOMI Ears: Normal auditory acuity Mouth: No deformity or lesions Lungs: Clear throughout to auscultation Heart: Regular rate and rhythm; no murmurs, rubs or bruits Abdomen: Soft, non tender and non distended. No masses, hepatosplenomegaly or hernias noted. Normal Bowel sounds Musculoskeletal: Symmetrical with no gross deformities  Pulses:  Normal pulses noted Extremities: No clubbing, cyanosis, edema or deformities noted Neurological: Alert oriented x 4, grossly nonfocal Psychological:  Alert and cooperative. Normal mood and affect  Assessment and Recommendations:  1. Crohn's ileocolitis. Frequent diarrhea. Obtain a CBC, CMET, tTG, IgA today. R/O IBS-D, gluten intolerance. Refill 6-mercaptopurine, Pamine and Lialda. Trial of low FODMAP diet and if not helpful then a trial of a gluten free diet. Consider a trial of discontinuing metformin to see if this impacts his diarrhea. Consider a trial of antibiotics for possible SIBO.  Surveillance colonoscopy at 3 years due June 2018. Return office visit 1 year.   2. Personal history of adenomatous colon polyps. Surveillance colonoscopy at 3 years due June 2018.   3. GERD. Continue standard antireflux measures. Refill Nexium.  4. Lactose intolerance. Avoid lactose.

## 2015-01-10 ENCOUNTER — Other Ambulatory Visit (INDEPENDENT_AMBULATORY_CARE_PROVIDER_SITE_OTHER): Payer: 59

## 2015-01-10 ENCOUNTER — Telehealth: Payer: Self-pay | Admitting: *Deleted

## 2015-01-10 DIAGNOSIS — D7589 Other specified diseases of blood and blood-forming organs: Secondary | ICD-10-CM

## 2015-01-10 DIAGNOSIS — K831 Obstruction of bile duct: Secondary | ICD-10-CM

## 2015-01-10 DIAGNOSIS — R17 Unspecified jaundice: Secondary | ICD-10-CM

## 2015-01-10 DIAGNOSIS — Q8789 Other specified congenital malformation syndromes, not elsewhere classified: Secondary | ICD-10-CM

## 2015-01-10 DIAGNOSIS — I890 Lymphedema, not elsewhere classified: Secondary | ICD-10-CM

## 2015-01-10 LAB — FOLATE

## 2015-01-10 LAB — BILIRUBIN, TOTAL: BILIRUBIN TOTAL: 2.6 mg/dL — AB (ref 0.2–1.2)

## 2015-01-10 LAB — VITAMIN B12: Vitamin B-12: >1500 pg/mL — ABNORMAL HIGH (ref 211–911)

## 2015-01-10 LAB — BILIRUBIN, DIRECT: BILIRUBIN DIRECT: 0.5 mg/dL — AB (ref 0.0–0.3)

## 2015-01-10 LAB — TISSUE TRANSGLUTAMINASE, IGA: TISSUE TRANSGLUTAMINASE AB, IGA: 1 U/mL (ref <4)

## 2015-01-10 NOTE — Telephone Encounter (Signed)
Patient called returning Sandy's phone call.

## 2015-01-10 NOTE — Telephone Encounter (Signed)
Did you call the patient?

## 2015-01-10 NOTE — Telephone Encounter (Addendum)
LMVM for pt to return call.   

## 2015-01-10 NOTE — Telephone Encounter (Signed)
Patient returned call. Please call and advise [phone]).

## 2015-01-10 NOTE — Telephone Encounter (Signed)
I called pt back.  LMVM ( needing to change appt date, please return call).

## 2015-01-10 NOTE — Telephone Encounter (Signed)
LMVM home and cell# re: appt scheduled for tomorrow.   Please return call.  (pt or wife, [name]).

## 2015-01-10 NOTE — Telephone Encounter (Signed)
I called and LMVM for sandy griffin, at [facility] EMU for MR on test done 12-09-14 on this pt.   He has appt 01-11-15.

## 2015-01-10 NOTE — Telephone Encounter (Signed)
I called pt and made appt for 01-23-15 at 0830 for pt with Dr. Krista Blue for her to go over the [facility] EMU results, and this would cancel appt for 01-11-15 with NP.

## 2015-01-11 ENCOUNTER — Ambulatory Visit: Payer: 59 | Admitting: Nurse Practitioner

## 2015-01-23 ENCOUNTER — Encounter: Payer: Self-pay | Admitting: Neurology

## 2015-01-23 ENCOUNTER — Ambulatory Visit (INDEPENDENT_AMBULATORY_CARE_PROVIDER_SITE_OTHER): Payer: 59 | Admitting: Neurology

## 2015-01-23 VITALS — BP 123/83 | HR 76 | Ht 70.5 in | Wt 220.0 lb

## 2015-01-23 DIAGNOSIS — G40209 Localization-related (focal) (partial) symptomatic epilepsy and epileptic syndromes with complex partial seizures, not intractable, without status epilepticus: Secondary | ICD-10-CM | POA: Diagnosis not present

## 2015-01-23 DIAGNOSIS — G4733 Obstructive sleep apnea (adult) (pediatric): Secondary | ICD-10-CM

## 2015-01-23 DIAGNOSIS — G40909 Epilepsy, unspecified, not intractable, without status epilepticus: Secondary | ICD-10-CM

## 2015-01-23 MED ORDER — ESCITALOPRAM OXALATE 10 MG PO TABS: 10.0000 mg | ORAL_TABLET | Freq: Every day | ORAL | Status: DC

## 2015-01-23 NOTE — Progress Notes (Signed)
Chief Complaint  Patient presents with  . Seizures    He is here with his wife, [name], to discuss the results of his video EEG from [facility].     [facility]  PATIENT: [name] [name] DOB: 1960-08-16   REASON FOR VISIT: follow up for worsening seizure disorder, confusion and dizziness,  memory lapse  HISTORY OF PRESENT ILLNESS:Mr.  Mr. spine is a 54 years old right-handed male, accompanied by his wife, was seen in refer by his primary care physician Dr. [name] for evaluation of seizure  He had a past medical history of diabetes, hypertension, Crohn's disease, on immunosuppressive treatment,  He presented with seizure-like event, reported initial event was June 28 2014, he was found by his wife to difficulty to be awake and eyes closed, took a while for him to open his eyes, and become very confused, also angry, episodes last about 5-10 minutes, there was also witnessed leg extension, jerking movement, He had multiple recurrent confusion episodes later,  MRI of the brain, MRA of the brain in April 2013 was normal, EEG showed intermittent left anterior and medial temporal lobe slowing  He later was also diagnosed with severe obstructive sleep apnea, using CPAP machine UPDATE August 1 st 2016: He continue have recurrent episode while taking Vimpat 200 mg twice a day, and titrating dose of Keppra,   I have reviewed and summarized his video EEG monitoring from [facility] in June 17 to December 14 2014, patient reported different spells, 1, memory/sleepiness, preceded by headaches, often followed by right arm numbness, 2, staring and oral automatism,  Video EEG monitoring was carried out during withdraw of the admission antiseizure medications, as well as sleep deprivation, for typical example of type I spells were recorded, with overall findings suggesting the diagnosis of psychogenic nonepileptic spells, is referred to psychotherapy, Keppra was  tapered off,   Interictal EEG was normal, non-of the type II spells were recorded, clinical impression is that spell type II is likely to be focal seizure, he was advised to keep Vimpat, decreased the dosage from 200 mg twice a day to 100 mg twice a day,  He is also taking Lexapro 10 mg daily, which has helped his depression anxiety,  REVIEW OF SYSTEMS: Full 14 system review of systems performed and notable only for those listed, all others are neg:  Hearing loss, blurry vision, abdominal pain, rectal bleeding, diarrhea, apnea, neck pain, neck stiffness, memory loss, dizziness, numbness, seizure, confusion, depression, anxiety   ALLERGIES: Allergies  Allergen Reactions  . Azithromycin Swelling  . Dilaudid [Hydromorphone Hcl] Nausea And Vomiting  . Penicillins Hives  . Sulfonamide Derivatives Hives    HOME MEDICATIONS: Outpatient Prescriptions Prior to Visit  Medication Sig Dispense Refill  . aspirin 81 MG tablet Take 81 mg by mouth daily.    Marland Kitchen CALCIUM PO Take 1 tablet by mouth 3 (three) times daily.     . Cyanocobalamin (VITAMIN B 12 PO) Take 1 tablet by mouth as needed.     . diltiazem (TIAZAC) 180 MG 24 hr capsule Take 1 capsule (180 mg total) by mouth daily. 90 capsule 3  . eletriptan (RELPAX) 40 MG tablet Take 1 tablet at onset of headache, may repeat in 2 hours if headache persists or reoccurs. 12 tablet 4  . escitalopram (LEXAPRO) 10 MG tablet Take 1 tablet by mouth daily.    Marland Kitchen esomeprazole (NEXIUM) 40 MG capsule One capsule by mouth twice daily 180 capsule 3  .  fluticasone (FLONASE) 50 MCG/ACT nasal spray Place 2 sprays into both nostrils daily. 48 g 3  . folic acid (FOLVITE) 1 MG tablet Take 2 tablets (2 mg total) by mouth daily. 180 tablet 3  . glimepiride (AMARYL) 2 MG tablet TAKE 1 TABLET DAILY BEFORE BREAKFAST 90 tablet 0  . glucose blood (CHOICE DM FORA G20 TEST STRIPS) test strip Use as instructed 100 each 12  . Lactase (DAIRY-RELIEF PO) Take 3 tablets by mouth 3  (three) times daily.     . mercaptopurine (PURINETHOL) 50 MG tablet Take 2 tablets (100 mg total) by mouth daily. Give on an empty stomach 1 hour before or 2 hours after meals. Caution: Chemotherapy. 180 tablet 3  . mesalamine (LIALDA) 1.2 G EC tablet Take 2 tablets (2.4 g total) by mouth daily. 180 tablet 3  . metFORMIN (GLUCOPHAGE) 500 MG tablet TAKE 2 TABLETS EVERY EVENING 180 tablet 3  . methscopolamine (PAMINE FORTE) 5 MG tablet Take 1 tablet (5 mg total) by mouth 2 (two) times daily. 180 tablet 3  . metoprolol tartrate (LOPRESSOR) 25 MG tablet TAKE ONE-HALF (1/2) TABLET (12.5 MG) TWICE A DAY ) 90 tablet 3  . ONE TOUCH ULTRA TEST test strip USE AS INSTRUCTED 100 each 2  . ONETOUCH DELICA LANCETS 49S MISC USE AS INSTRUCTED (NEEDS OFFICE VISIT) 100 each 0  . potassium chloride SA (KLOR-CON M20) 20 MEQ tablet Take 1 tablet (20 mEq total) by mouth 2 (two) times daily. 180 tablet 3  . saccharomyces boulardii (FLORASTOR) 250 MG capsule Take 250 mg by mouth daily.     . tamsulosin (FLOMAX) 0.4 MG CAPS Take 0.4 mg by mouth as needed.     . lacosamide (VIMPAT) 200 MG TABS tablet Take 1 tablet (200 mg total) by mouth 2 (two) times daily. (Patient taking differently: Take 100 mg by mouth 2 (two) times daily. ) 180 tablet 0  . glucosamine-chondroitin 500-400 MG tablet Take 2 tablets by mouth daily.    Marland Kitchen HYDROcodone-acetaminophen (NORCO/VICODIN) 5-325 MG per tablet Take 1 tablet by mouth every 6 (six) hours as needed for moderate pain. 15 tablet 0   No facility-administered medications prior to visit.    PAST MEDICAL HISTORY: Past Medical History  Diagnosis Date  . Hypertension   . GERD (gastroesophageal reflux disease)   . Adenomatous colon polyp 10/1999  . Osteoporosis   . External hemorrhoids   . Crohn's disease of small and large intestines 1999  . Pneumonia   . Esophageal stricture   . MRSA (methicillin resistant Staphylococcus aureus) 10/2010   . Diabetes mellitus   . Hyperlipemia   .  Cancer     skin  . Kidney stones   . Sleep apnea     CPAP machine  . PONV (postoperative nausea and vomiting)   . Complex partial seizure   . Migraines   . Seizures   . Cataract   . History of nuclear stress test 11/15/2010    exercise myoview; normal pattern of perfusion; normal, low risk study   . Skin cancer     PAST SURGICAL HISTORY: Past Surgical History  Procedure Laterality Date  . Shoulder surgery    . Sinus surgery with instatrak    . Elbow surgery  2012    elbow MRSA infection   . Cataract extraction Bilateral   . Vasectomy    . Eye surgery    . Transthoracic echocardiogram  02/22/2011    EF 55-65%; increased pattern of LVH with mild conc  hypertrophy, abnormal relaxation & increased filling pressure (grade 2 diastolic dysfunction); atrial septum thickened (lipomatous hypertrophy)    FAMILY HISTORY: Family History  Problem Relation Age of Onset  . Colon polyps Father   . Heart disease Father     HTN, aneursym  . Stroke Father   . Hyperlipidemia Father   . Hypertension Father   . Heart disease Mother     CABG at age 54  . Stroke Mother   . Hyperlipidemia Mother   . Hypertension Mother   . Colon cancer Neg Hx   . Stroke Paternal Grandmother   . Stroke Paternal Grandfather   . Other Child     tetrology of fallot (cornealia deland syndrome)    SOCIAL HISTORY: History   Social History  . Marital Status: Married    Spouse Name: [name]  . Number of Children: 2  . Years of Education: college   Occupational History  . disabled    Social History Main Topics  . Smoking status: Never Smoker   . Smokeless tobacco: Never Used  . Alcohol Use: No  . Drug Use: No  . Sexual Activity: Yes    Birth Control/ Protection: Surgical     Comment: 1 partner in last 12 months   Other Topics Concern  . Not on file   Social History Narrative   Patient is married [name]) and lives at home with his family.   Patient has two children.   Patient has a college  education.   Patient is right-handed.   Patient drinks one cup of soda daily.     PHYSICAL EXAM  Filed Vitals:   01/23/15 0819  BP: 123/83  Pulse: 76  Height: 5' 10.5" (1.791 m)  Weight: 220 lb (99.791 kg)   Body mass index is 31.11 kg/(m^2).  PHYSICAL EXAMNIATION:  Gen: NAD, conversant, well nourised, obese, well groomed                     Cardiovascular: Regular rate rhythm, no peripheral edema, warm, nontender. Eyes: Conjunctivae clear without exudates or hemorrhage Neck: Supple, no carotid bruise. Pulmonary: Clear to auscultation bilaterally   NEUROLOGICAL EXAM:  MENTAL STATUS: Speech:    Speech is normal; fluent and spontaneous with normal comprehension.  Cognition:     Orientation to time, place and person     Normal recent and remote memory     Normal Attention span and concentration     Normal Language, naming, repeating,spontaneous speech     Fund of knowledge   CRANIAL NERVES: CN II: Visual fields are full to confrontation. Fundoscopic exam is normal with sharp discs and no vascular changes. Pupils are round equal and briskly reactive to light. CN III, IV, VI: extraocular movement are normal. No ptosis. CN V: Facial sensation is intact to pinprick in all 3 divisions bilaterally. Corneal responses are intact.  CN VII: Face is symmetric with normal eye closure and smile. CN VIII: Hearing is normal to rubbing fingers CN IX, X: Palate elevates symmetrically. Phonation is normal. CN XI: Head turning and shoulder shrug are intact CN XII: Tongue is midline with normal movements and no atrophy.  MOTOR: There is no pronator drift of out-stretched arms. Muscle bulk and tone are normal. Muscle strength is normal.  REFLEXES: Reflexes are 2+ and symmetric at the biceps, triceps, knees, and ankles. Plantar responses are flexor.  SENSORY: Mildly length dependent decreased light touch, pinprick, vibratory sensation at toes  COORDINATION: Rapid alternating movements  and  fine finger movements are intact. There is no dysmetria on finger-to-nose and heel-knee-shin.    GAIT/STANCE: Posture is normal. Gait is steady with normal steps, base, arm swing, and turning. Heel and toe walking are normal. Tandem gait is normal.  Romberg is absent.   DIAGNOSTIC DATA (LABS, IMAGING, TESTING) - I reviewed patient records, labs, notes, testing and imaging myself where available.  Lab Results  Component Value Date   WBC 7.6 01/09/2015   HGB 13.9 01/09/2015   HCT 40.8 01/09/2015   MCV 109.6* 01/09/2015   PLT 157.0 01/09/2015      Component Value Date/Time   NA 141 01/09/2015 1637   K 3.7 01/09/2015 1637   CL 108 01/09/2015 1637   CO2 24 01/09/2015 1637   GLUCOSE 105* 01/09/2015 1637   BUN 15 01/09/2015 1637   CREATININE 0.85 01/09/2015 1637   CREATININE 0.83 07/07/2014 1612   CALCIUM 9.4 01/09/2015 1637   PROT 7.2 01/09/2015 1637   ALBUMIN 4.2 01/09/2015 1637   AST 33 01/09/2015 1637   ALT 27 01/09/2015 1637   ALKPHOS 80 01/09/2015 1637   BILITOT 2.6* 01/10/2015 1254   GFRNONAA >89 07/07/2014 1612   GFRNONAA >90 01/04/2013 2155   GFRAA >89 07/07/2014 1612   GFRAA >90 01/04/2013 2155   Lab Results  Component Value Date   CHOL 158 07/07/2014   HDL 43 07/07/2014   LDLCALC 81 07/07/2014   TRIG 170* 07/07/2014   CHOLHDL 3.7 07/07/2014   Lab Results  Component Value Date   HGBA1C 5.9 08/28/2014    ASSESSMENT AND PLAN  54 y.o. year old male   Depression anxiety  Keep Lexapro 10 mg daily  He also has non-epileptic seizures based on video EEG monitoring, is receiving psychotherapy, which has been helpful  Probable partial seizure  Keep Vimpat 100 mg twice a day   Marcial Pacas, M.D. Ph.D.  [facility] [place], [place] [address] Phone: [phone] Fax:      [phone]

## 2015-02-16 ENCOUNTER — Ambulatory Visit (INDEPENDENT_AMBULATORY_CARE_PROVIDER_SITE_OTHER): Payer: 59 | Admitting: Family Medicine

## 2015-02-16 VITALS — BP 110/72 | HR 68 | Temp 98.3°F | Resp 18 | Ht 71.0 in | Wt 224.0 lb

## 2015-02-16 DIAGNOSIS — E119 Type 2 diabetes mellitus without complications: Secondary | ICD-10-CM | POA: Diagnosis not present

## 2015-02-16 DIAGNOSIS — G40909 Epilepsy, unspecified, not intractable, without status epilepticus: Secondary | ICD-10-CM | POA: Diagnosis not present

## 2015-02-16 DIAGNOSIS — Z23 Encounter for immunization: Secondary | ICD-10-CM | POA: Diagnosis not present

## 2015-02-16 DIAGNOSIS — R7989 Other specified abnormal findings of blood chemistry: Secondary | ICD-10-CM

## 2015-02-16 DIAGNOSIS — E7219 Other disorders of sulfur-bearing amino-acid metabolism: Secondary | ICD-10-CM

## 2015-02-16 DIAGNOSIS — E7211 Homocystinuria: Secondary | ICD-10-CM

## 2015-02-16 DIAGNOSIS — K5090 Crohn's disease, unspecified, without complications: Secondary | ICD-10-CM

## 2015-02-16 LAB — POCT CBC
Granulocyte percent: 71.2 %G (ref 37–80)
HCT, POC: 41.3 % — AB (ref 43.5–53.7)
Hemoglobin: 13.6 g/dL — AB (ref 14.1–18.1)
Lymph, poc: 1.6 (ref 0.6–3.4)
MCH, POC: 35.8 pg — AB (ref 27–31.2)
MCHC: 32.9 g/dL (ref 31.8–35.4)
MCV: 108.9 fL — AB (ref 80–97)
MID (cbc): 0.5 (ref 0–0.9)
MPV: 7.7 fL (ref 0–99.8)
POC Granulocyte: 5.1 (ref 2–6.9)
POC LYMPH PERCENT: 22 %L (ref 10–50)
POC MID %: 6.8 %M (ref 0–12)
Platelet Count, POC: 164 10*3/uL (ref 142–424)
RBC: 3.79 M/uL — AB (ref 4.69–6.13)
RDW, POC: 17 %
WBC: 7.2 10*3/uL (ref 4.6–10.2)

## 2015-02-16 LAB — POCT GLYCOSYLATED HEMOGLOBIN (HGB A1C): Hemoglobin A1C: 5

## 2015-02-16 MED ORDER — ESCITALOPRAM OXALATE 10 MG PO TABS: 10.0000 mg | ORAL_TABLET | Freq: Every day | ORAL | Status: DC

## 2015-02-16 MED ORDER — GLIMEPIRIDE 2 MG PO TABS: 2.0000 mg | ORAL_TABLET | Freq: Every day | ORAL | Status: DC

## 2015-02-16 MED ORDER — FOLIC ACID 1 MG PO TABS: 2.0000 mg | ORAL_TABLET | Freq: Every day | ORAL | Status: DC

## 2015-02-16 MED ORDER — TAMSULOSIN HCL 0.4 MG PO CAPS: 0.4000 mg | ORAL_CAPSULE | ORAL | Status: DC | PRN

## 2015-02-16 MED ORDER — ESOMEPRAZOLE MAGNESIUM 40 MG PO CPDR: DELAYED_RELEASE_CAPSULE | ORAL | Status: DC

## 2015-02-16 MED ORDER — MESALAMINE 1.2 G PO TBEC: 2.4000 g | DELAYED_RELEASE_TABLET | Freq: Every day | ORAL | Status: DC

## 2015-02-16 MED ORDER — METFORMIN HCL 500 MG PO TABS: ORAL_TABLET | ORAL | Status: DC

## 2015-02-16 MED ORDER — METOPROLOL TARTRATE 25 MG PO TABS: ORAL_TABLET | ORAL | Status: DC

## 2015-02-16 NOTE — Progress Notes (Addendum)
Subjective:  This chart was scribed for [name], MD by Leandra Kern, Medical Scribe. This patient was seen in Room 12 and the patient's care was started at 6:39 PM.   Patient ID: [name] [name], male    DOB: 1961-02-26, 54 y.o.   MRN: [id number]  Chief Complaint  Patient presents with   Follow-up    FMLA    HPI HPI Comments: [name] [name] is a 54 y.o. male with a medical history of Seizures and DM who presents to [facility] for a follow up regarding his seizure symptoms . Pt reports that his provider advised him to stop driver. Pt's wife notes that the pt is doing great, she notes that he walks about 5 miles a day. However she does note that the patient has been having problems retaining information or remembering things. Pt reports that he struggles with remembering peoples names such as individuals that he sees every day. Pt states that he sleeps well, and he indicates that he was prescribed Lexapro that he has found relief with. Pt reports that he advised that he is not suffering from epileptic seizure. He notes that he still suffers from seizure episodes where he blanks out, but he attributes them to stress and anxiety. Pt states that he still present with tremors. He denies numbness in the feet.   Pt reports that his blood sugar is still running a little higher than normal (in the 120's range) however it is controlled and stable. Pt indicates that he is having an orange-colored urine that is accompanied with strong smell even though he stays hydrated most of the time. Pt notes that he does see an optometrist regularly.    Patient Active Problem List   Diagnosis Date Noted   Seizure 04/04/2013   Chest pain 03/12/2013   Abnormal EKG 03/12/2013   DOE (dyspnea on exertion) 03/12/2013   Obstructive sleep apnea 10/01/2012   Complex partial seizure 04/19/2012   DM2 (diabetes mellitus, type 2) 09/25/2011   Altered mental status 09/25/2011   OTHER  DYSPHAGIA 03/27/2009   TRANSAMINASES, SERUM, ELEVATED 03/27/2009   Cough 11/10/2008   GERD 12/07/2007   COLONIC POLYPS, ADENOMATOUS, HX OF 12/07/2007   EXTERNAL HEMORRHOIDS 09/11/2007   CROHN'S DISEASE, LARGE AND SMALL INTESTINES 09/11/2007   OSTEOPOROSIS 09/11/2007   HYPERLIPIDEMIA NEC/NOS 02/27/2007   HYPERTENSION, ESSENTIAL NOS 02/27/2007   Past Medical History  Diagnosis Date   Hypertension    GERD (gastroesophageal reflux disease)    Adenomatous colon polyp 10/1999   Osteoporosis    External hemorrhoids    Crohn's disease of small and large intestines 1999   Pneumonia    Esophageal stricture    MRSA (methicillin resistant Staphylococcus aureus) 10/2010    Diabetes mellitus    Hyperlipemia    Cancer     skin   Kidney stones    Sleep apnea     CPAP machine   PONV (postoperative nausea and vomiting)    Complex partial seizure    Migraines    Seizures    Cataract    History of nuclear stress test 11/15/2010    exercise myoview; normal pattern of perfusion; normal, low risk study    Skin cancer    Past Surgical History  Procedure Laterality Date   Shoulder surgery     Sinus surgery with instatrak     Elbow surgery  2012    elbow MRSA infection    Cataract extraction Bilateral    Vasectomy  Eye surgery     Transthoracic echocardiogram  02/22/2011    EF 55-65%; increased pattern of LVH with mild conc hypertrophy, abnormal relaxation & increased filling pressure (grade 2 diastolic dysfunction); atrial septum thickened (lipomatous hypertrophy)   Allergies  Allergen Reactions   Azithromycin Swelling   Dilaudid [Hydromorphone Hcl] Nausea And Vomiting   Penicillins Hives   Sulfonamide Derivatives Hives   Prior to Admission medications   Medication Sig Start Date End Date Taking? Authorizing Provider  aspirin 81 MG tablet Take 81 mg by mouth daily.    Historical Provider, MD  CALCIUM PO Take 1 tablet by mouth 3 (three) times  daily.     Historical Provider, MD  Cyanocobalamin (VITAMIN B 12 PO) Take 1 tablet by mouth as needed.     Historical Provider, MD  diltiazem (TIAZAC) 180 MG 24 hr capsule Take 1 capsule (180 mg total) by mouth daily. 07/07/14   [name], MD  eletriptan (RELPAX) 40 MG tablet Take 1 tablet at onset of headache, may repeat in 2 hours if headache persists or reoccurs. 01/18/13   Wendie Agreste, MD  escitalopram (LEXAPRO) 10 MG tablet Take 1 tablet (10 mg total) by mouth daily. 01/23/15   Marcial Pacas, MD  esomeprazole (NEXIUM) 40 MG capsule One capsule by mouth twice daily 01/09/15   Ladene Artist, MD  Ferrous Sulfate (IRON SUPPLEMENT PO) Take by mouth.    Historical Provider, MD  fluticasone (FLONASE) 50 MCG/ACT nasal spray Place 2 sprays into both nostrils daily. 07/07/14   [name], MD  folic acid (FOLVITE) 1 MG tablet Take 2 tablets (2 mg total) by mouth daily. 07/07/14   [name], MD  glimepiride (AMARYL) 2 MG tablet TAKE 1 TABLET DAILY BEFORE BREAKFAST 12/16/14   [name], MD  glucose blood (CHOICE DM FORA G20 TEST STRIPS) test strip Use as instructed 11/13/12   [name], MD  Lacosamide (VIMPAT) 100 MG TABS Take 100 mg by mouth 2 (two) times daily.    Historical Provider, MD  Lactase (DAIRY-RELIEF PO) Take 3 tablets by mouth 3 (three) times daily.     Historical Provider, MD  mercaptopurine (PURINETHOL) 50 MG tablet Take 2 tablets (100 mg total) by mouth daily. Give on an empty stomach 1 hour before or 2 hours after meals. Caution: Chemotherapy. 01/09/15   Ladene Artist, MD  mesalamine (LIALDA) 1.2 G EC tablet Take 2 tablets (2.4 g total) by mouth daily. 01/09/15   Ladene Artist, MD  metFORMIN (GLUCOPHAGE) 500 MG tablet TAKE 2 TABLETS EVERY EVENING 07/07/14   [name], MD  methscopolamine (PAMINE FORTE) 5 MG tablet Take 1 tablet (5 mg total) by mouth 2 (two) times daily. 01/09/15   Ladene Artist, MD  metoprolol tartrate (LOPRESSOR) 25 MG tablet TAKE ONE-HALF  (1/2) TABLET (12.5 MG) TWICE A DAY ) 07/07/14   [name], MD  ONE [facility] ULTRA TEST test strip USE AS INSTRUCTED    Mancel Bale, PA-C  [facility] DELICA LANCETS 02V MISC USE AS INSTRUCTED (NEEDS OFFICE VISIT) 02/04/13   Rise Mu, PA-C  potassium chloride SA (KLOR-CON M20) 20 MEQ tablet Take 1 tablet (20 mEq total) by mouth 2 (two) times daily. 07/07/14   [name], MD  saccharomyces boulardii (FLORASTOR) 250 MG capsule Take 250 mg by mouth daily.     Historical Provider, MD  tamsulosin (FLOMAX) 0.4 MG CAPS Take 0.4 mg by mouth as needed.     Historical Provider, MD  Social History   Social History   Marital Status: Married    Spouse Name: [name]   Number of Children: 2   Years of Education: college   Occupational History   disabled    Social History Main Topics   Smoking status: Never Smoker    Smokeless tobacco: Never Used   Alcohol Use: No   Drug Use: No   Sexual Activity: Yes    Patent examiner Protection: Surgical     Comment: 1 partner in last 12 months   Other Topics Concern   Not on file   Social History Narrative   Patient is married [name]) and lives at home with his family.   Patient has two children.   Patient has a college education.   Patient is right-handed.   Patient drinks one cup of soda daily.    Review of Systems  Neurological: Positive for tremors and seizures. Negative for numbness.  Psychiatric/Behavioral: Positive for confusion.      Objective:   Physical Exam  Constitutional: He is oriented to person, place, and time. He appears well-developed and well-nourished. No distress.  HENT:  Head: Normocephalic and atraumatic.  Eyes: EOM are normal. Pupils are equal, round, and reactive to light.  Neck: Neck supple.  Cardiovascular: Normal rate.   Pulmonary/Chest: Effort normal.  Abdominal: There is tenderness (Diffused).  Neurological: He is alert and oriented to person, place, and time. No cranial nerve deficit.  Skin: Skin  is warm and dry.  Psychiatric: He has a normal mood and affect. His behavior is normal.  Nursing note and vitals reviewed.  reflexes are normal, patient has a mild bilateral fine tremor of his hands and arms Abdomen shows no rebound BP 110/72 mmHg   Pulse 68   Temp(Src) 98.3 F (36.8 C)   Resp 18   Ht 5' 11"  (1.803 m)   Wt 224 lb (101.606 kg)   BMI 31.26 kg/m2   SpO2 98%     Assessment & Plan:   This chart was scribed in my presence and reviewed by me personally.    ICD-9-CM ICD-10-CM   1. Seizure disorder 345.90 G40.909 Comprehensive metabolic panel  2. Crohn disease, without complications 161.0 R60.45 POCT CBC     C-reactive protein  3. Uncomplicated type 2 diabetes mellitus 250.00 E11.9 Microalbumin, urine     Comprehensive metabolic panel     Lipid panel     POCT glycosylated hemoglobin (Hb A1C)  4. Elevated homocysteine 409.8 J19.14 folic acid (FOLVITE) 1 MG tablet    Overall Mr. Spiveyis doing extremely well since he's been on disability. His Crohn's is still somewhat erratic and he does continue to have seizures which means he cannot work for the recent future. Nevertheless he is able to function around the house and I'm not worried about him falling as much. Also his diabetes is been much better controlled. Therefore I am approving indefinite disability for this man. Signed, [name], MD

## 2015-02-17 ENCOUNTER — Encounter: Payer: Self-pay | Admitting: Family Medicine

## 2015-02-17 LAB — LIPID PANEL
Cholesterol: 159 mg/dL (ref 125–200)
HDL: 48 mg/dL (ref >=40)
LDL Cholesterol: 82 mg/dL (ref <130)
Total CHOL/HDL Ratio: 3.3 Ratio (ref <=5.0)
Triglycerides: 147 mg/dL (ref <150)
VLDL: 29 mg/dL (ref <30)

## 2015-02-17 LAB — COMPREHENSIVE METABOLIC PANEL
ALT: 35 U/L (ref 9–46)
AST: 47 U/L — ABNORMAL HIGH (ref 10–35)
Albumin: 4.2 g/dL (ref 3.6–5.1)
Alkaline Phosphatase: 82 U/L (ref 40–115)
BUN: 13 mg/dL (ref 7–25)
CO2: 23 mmol/L (ref 20–31)
Calcium: 9.1 mg/dL (ref 8.6–10.3)
Chloride: 105 mmol/L (ref 98–110)
Creat: 0.78 mg/dL (ref 0.70–1.33)
Glucose, Bld: 73 mg/dL (ref 65–99)
Potassium: 3.8 mmol/L (ref 3.5–5.3)
Sodium: 139 mmol/L (ref 135–146)
Total Bilirubin: 3.1 mg/dL — ABNORMAL HIGH (ref 0.2–1.2)
Total Protein: 7 g/dL (ref 6.1–8.1)

## 2015-02-17 LAB — MICROALBUMIN, URINE: Microalb, Ur: 2.2 mg/dL — ABNORMAL HIGH (ref <2.0)

## 2015-02-17 LAB — C-REACTIVE PROTEIN: CRP: <0.5 mg/dL (ref <0.60)

## 2015-02-21 DIAGNOSIS — Z0271 Encounter for disability determination: Secondary | ICD-10-CM

## 2015-03-17 DIAGNOSIS — Z0271 Encounter for disability determination: Secondary | ICD-10-CM

## 2015-04-05 ENCOUNTER — Encounter: Payer: Self-pay | Admitting: Gastroenterology

## 2015-04-13 ENCOUNTER — Telehealth: Payer: Self-pay

## 2015-04-13 NOTE — Telephone Encounter (Signed)
Scanned and faxed in completed paperwork.

## 2015-04-13 NOTE — Telephone Encounter (Signed)
Patient brought in FMLA forms to be completed by Dr. Carlean Jews, I have filled out what I could and highlighted what needs to be done. Please return to the FMLA/Disability box at the [address] Check out desk with in 5-7 business days. I am placing in your box on 04/13/15.

## 2015-04-14 DIAGNOSIS — Z0289 Encounter for other administrative examinations: Secondary | ICD-10-CM

## 2015-04-25 ENCOUNTER — Ambulatory Visit (INDEPENDENT_AMBULATORY_CARE_PROVIDER_SITE_OTHER): Payer: 59 | Admitting: Nurse Practitioner

## 2015-04-25 ENCOUNTER — Encounter: Payer: Self-pay | Admitting: Nurse Practitioner

## 2015-04-25 VITALS — BP 129/83 | HR 81 | Ht 71.0 in | Wt 226.4 lb

## 2015-04-25 DIAGNOSIS — R569 Unspecified convulsions: Secondary | ICD-10-CM | POA: Insufficient documentation

## 2015-04-25 DIAGNOSIS — G4733 Obstructive sleep apnea (adult) (pediatric): Secondary | ICD-10-CM

## 2015-04-25 DIAGNOSIS — G40209 Localization-related (focal) (partial) symptomatic epilepsy and epileptic syndromes with complex partial seizures, not intractable, without status epilepticus: Secondary | ICD-10-CM

## 2015-04-25 NOTE — Progress Notes (Signed)
I have reviewed and agreed above plan. 

## 2015-04-25 NOTE — Progress Notes (Signed)
[facility]  PATIENT: [name] [name] DOB: [birth year]   REASON FOR VISIT: Complex partial seizures, obstructive sleep apnea, pseudoseizures HISTORY FROM: Patient    HISTORY OF PRESENT ILLNESS: HISTORY:YY [name] [name] is a 54 years old right-handed male, accompanied by his wife, was seen in refer by his primary care physician Dr. [name] for evaluation of seizure He had a past medical history of diabetes, hypertension, Crohn's disease, on immunosuppressive treatment, He presented with seizure-like event, reported initial event was June 28 2014, he was found by his wife to difficulty to be awake and eyes closed, took a while for him to open his eyes, and become very confused, also angry, episodes last about 5-10 minutes, there was also witnessed leg extension, jerking movement, He had multiple recurrent confusion episodes later, MRI of the brain, MRA of the brain in April 2013 was normal, EEG showed intermittent left anterior and medial temporal lobe slowing He later was also diagnosed with severe obstructive sleep apnea, using CPAP machine UPDATE August 1 st 2016:YYHe continue have recurrent episode while taking Vimpat 200 mg twice a day, and titrating dose of Keppra,  I have reviewed and summarized his video EEG monitoring from [facility] in June 17 to December 14 2014, patient reported different spells, 1, memory/sleepiness, preceded by headaches, often followed by right arm numbness, 2, staring and oral automatism, Video EEG monitoring was carried out during withdraw of the admission antiseizure medications, as well as sleep deprivation, for typical example of type I spells were recorded, with overall findings suggesting the diagnosis of psychogenic nonepileptic spells, is referred to psychotherapy, Keppra was tapered off,  Interictal EEG was normal, non-of the type II spells were recorded, clinical impression is that spell type II is likely to be focal seizure, he  was advised to keep Vimpat, decreased the dosage from 200 mg twice a day to 100 mg twice a day, He is also taking Lexapro 10 mg daily, which has helped his depression anxiety, UPDATE 04/25/2015 Mr. 49, 54 year old male returns for follow-up. He has a history of obstructive sleep apnea with CPAP, focal seizures, and pseudoseizures. He is currently seeing a counselor Hilton Cork once a week and he has about 1 pseudoseizure per week. He is not seen a psychiatrist. He has felt counseling effective. He is now walking for exercise. He remains on Vimpat 100 twice daily without side effects. He is also on Lexapro. He returns for reevaluation  REVIEW OF SYSTEMS: Full 14 system review of systems performed and notable only for those listed, all others are neg:  Constitutional: neg  Cardiovascular: neg Ear/Nose/Throat: Hearing loss Skin: neg Eyes: neg Respiratory: neg Gastroitestinal: neg  Hematology/Lymphatic: neg  Endocrine: neg Musculoskeletal:neg Allergy/Immunology: neg Neurological: Seizure disorder, memory loss Psychiatric: Depression and anxiety Sleep : Obstructive sleep apnea with CPAP   ALLERGIES: Allergies  Allergen Reactions  . Azithromycin Swelling  . Dilaudid [Hydromorphone Hcl] Nausea And Vomiting  . Penicillins Hives  . Sulfonamide Derivatives Hives    HOME MEDICATIONS: Outpatient Prescriptions Prior to Visit  Medication Sig Dispense Refill  . aspirin 81 MG tablet Take 81 mg by mouth daily.    Marland Kitchen CALCIUM PO Take 1 tablet by mouth 3 (three) times daily.     . Cyanocobalamin (VITAMIN B 12 PO) Take 1 tablet by mouth as needed.     . diltiazem (TIAZAC) 180 MG 24 hr capsule Take 1 capsule (180 mg total) by mouth daily. 90 capsule 3  . eletriptan (RELPAX) 40 MG tablet  Take 1 tablet at onset of headache, may repeat in 2 hours if headache persists or reoccurs. (Patient taking differently: as needed. Take 1 tablet at onset of headache, may repeat in 2 hours if headache persists or  reoccurs.) 12 tablet 4  . escitalopram (LEXAPRO) 10 MG tablet Take 1 tablet (10 mg total) by mouth daily. 90 tablet 3  . esomeprazole (NEXIUM) 40 MG capsule One capsule by mouth twice daily 180 capsule 3  . Ferrous Sulfate (IRON SUPPLEMENT PO) Take by mouth.    . fluticasone (FLONASE) 50 MCG/ACT nasal spray Place 2 sprays into both nostrils daily. 48 g 3  . folic acid (FOLVITE) 1 MG tablet Take 2 tablets (2 mg total) by mouth daily. 180 tablet 3  . glimepiride (AMARYL) 2 MG tablet Take 1 tablet (2 mg total) by mouth daily before breakfast. 90 tablet 3  . glucose blood (CHOICE DM FORA G20 TEST STRIPS) test strip Use as instructed 100 each 12  . Lacosamide (VIMPAT) 100 MG TABS Take 100 mg by mouth 2 (two) times daily.    . Lactase (DAIRY-RELIEF PO) Take 3 tablets by mouth 3 (three) times daily.     . mercaptopurine (PURINETHOL) 50 MG tablet Take 2 tablets (100 mg total) by mouth daily. Give on an empty stomach 1 hour before or 2 hours after meals. Caution: Chemotherapy. 180 tablet 3  . mesalamine (LIALDA) 1.2 G EC tablet Take 2 tablets (2.4 g total) by mouth daily. 180 tablet 3  . metFORMIN (GLUCOPHAGE) 500 MG tablet TAKE 2 TABLETS EVERY EVENING 180 tablet 3  . methscopolamine (PAMINE FORTE) 5 MG tablet Take 1 tablet (5 mg total) by mouth 2 (two) times daily. 180 tablet 3  . metoprolol tartrate (LOPRESSOR) 25 MG tablet TAKE ONE-HALF (1/2) TABLET (12.5 MG) TWICE A DAY ) 90 tablet 3  . ONE TOUCH ULTRA TEST test strip USE AS INSTRUCTED 100 each 2  . ONETOUCH DELICA LANCETS 03E MISC USE AS INSTRUCTED (NEEDS OFFICE VISIT) 100 each 0  . potassium chloride SA (KLOR-CON M20) 20 MEQ tablet Take 1 tablet (20 mEq total) by mouth 2 (two) times daily. 180 tablet 3  . saccharomyces boulardii (FLORASTOR) 250 MG capsule Take 250 mg by mouth daily.     . tamsulosin (FLOMAX) 0.4 MG CAPS capsule Take 1 capsule (0.4 mg total) by mouth as needed. 30 capsule 11   No facility-administered medications prior to visit.     PAST MEDICAL HISTORY: Past Medical History  Diagnosis Date  . Hypertension   . GERD (gastroesophageal reflux disease)   . Adenomatous colon polyp 10/1999  . Osteoporosis   . External hemorrhoids   . Crohn's disease of small and large intestines ([place]) 1999  . Pneumonia   . Esophageal stricture   . MRSA (methicillin resistant Staphylococcus aureus) 10/2010   . Diabetes mellitus   . Hyperlipemia   . Cancer ([place])     skin  . Kidney stones   . Sleep apnea     CPAP machine  . PONV (postoperative nausea and vomiting)   . Complex partial seizure ([place])   . Migraines   . Seizures ([place])   . Cataract   . History of nuclear stress test 11/15/2010    exercise myoview; normal pattern of perfusion; normal, low risk study   . Skin cancer     PAST SURGICAL HISTORY: Past Surgical History  Procedure Laterality Date  . Shoulder surgery    . Sinus surgery with instatrak    .  Elbow surgery  2012    elbow MRSA infection   . Cataract extraction Bilateral   . Vasectomy    . Eye surgery    . Transthoracic echocardiogram  02/22/2011    EF 55-65%; increased pattern of LVH with mild conc hypertrophy, abnormal relaxation & increased filling pressure (grade 2 diastolic dysfunction); atrial septum thickened (lipomatous hypertrophy)    FAMILY HISTORY: Family History  Problem Relation Age of Onset  . Colon polyps Father   . Heart disease Father     HTN, aneursym  . Stroke Father   . Hyperlipidemia Father   . Hypertension Father   . Heart disease Mother     CABG at age 15  . Stroke Mother   . Hyperlipidemia Mother   . Hypertension Mother   . Colon cancer Neg Hx   . Stroke Paternal Grandmother   . Stroke Paternal Grandfather   . Other Child     tetrology of fallot (cornealia deland syndrome)    SOCIAL HISTORY: Social History   Social History  . Marital Status: Married    Spouse Name: [name]  . Number of Children: 2  . Years of Education: college   Occupational History  .  disabled    Social History Main Topics  . Smoking status: Never Smoker   . Smokeless tobacco: Never Used  . Alcohol Use: No  . Drug Use: No  . Sexual Activity: Yes    Birth Control/ Protection: Surgical     Comment: 1 partner in last 12 months   Other Topics Concern  . Not on file   Social History Narrative   Patient is married [name]) and lives at home with his family.   Patient has two children.   Patient has a college education.   Patient is right-handed.   Patient drinks one cup of soda daily.     PHYSICAL EXAM  Filed Vitals:   04/25/15 0824  BP: 129/83  Pulse: 81  Height: 5' 11"  (1.803 m)  Weight: 226 lb 6.4 oz (102.694 kg)   Body mass index is 31.59 kg/(m^2).  Generalized: Well developed, obese male in no acute distress  Head: normocephalic and atraumatic,. Oropharynx benign  Neck: Supple, no carotid bruits  Cardiac: Regular rate rhythm, no murmur  Musculoskeletal: No deformity   Neurological examination   Mentation: Alert oriented to time, place, history taking. Attention span and concentration appropriate. Recent and remote memory intact.  Follows all commands speech and language fluent.   Cranial nerve II-XII: Fundoscopic exam reveals sharp disc margins.Pupils were equal round reactive to light extraocular movements were full, visual field were full on confrontational test. Facial sensation and strength were normal. hearing was intact to finger rubbing bilaterally. Uvula tongue midline. head turning and shoulder shrug were normal and symmetric.Tongue protrusion into cheek strength was normal. Motor: normal bulk and tone, full strength in the BUE, BLE, fine finger movements normal, no pronator drift. No focal weakness Sensory: Mildly length dependent decreased light touch pinprick and vibratory at toes  Coordination: finger-nose-finger, heel-to-shin bilaterally, no dysmetria Reflexes: Brachioradialis 2/2, biceps 2/2, triceps 2/2, patellar 2/2, Achilles 2/2,  plantar responses were flexor bilaterally. Gait and Station: Rising up from seated position without assistance, normal stance,  moderate stride, good arm swing, smooth turning, able to perform tiptoe, and heel walking without difficulty. Tandem gait is steady  DIAGNOSTIC DATA (LABS, IMAGING, TESTING) - I reviewed patient records, labs, notes, testing and imaging myself where available.  Lab Results  Component Value Date  WBC 7.2 02/16/2015   HGB 13.6* 02/16/2015   HCT 41.3* 02/16/2015   MCV 108.9* 02/16/2015   PLT 157.0 01/09/2015      Component Value Date/Time   NA 139 02/16/2015 1900   K 3.8 02/16/2015 1900   CL 105 02/16/2015 1900   CO2 23 02/16/2015 1900   GLUCOSE 73 02/16/2015 1900   BUN 13 02/16/2015 1900   CREATININE 0.78 02/16/2015 1900   CREATININE 0.85 01/09/2015 1637   CALCIUM 9.1 02/16/2015 1900   PROT 7.0 02/16/2015 1900   ALBUMIN 4.2 02/16/2015 1900   AST 47* 02/16/2015 1900   ALT 35 02/16/2015 1900   ALKPHOS 82 02/16/2015 1900   BILITOT 3.1* 02/16/2015 1900   GFRNONAA >89 07/07/2014 1612   GFRNONAA >90 01/04/2013 2155   GFRAA >89 07/07/2014 1612   GFRAA >90 01/04/2013 2155   Lab Results  Component Value Date   CHOL 159 02/16/2015   HDL 48 02/16/2015   LDLCALC 82 02/16/2015   TRIG 147 02/16/2015   CHOLHDL 3.3 02/16/2015   Lab Results  Component Value Date   HGBA1C 5.0 02/16/2015   Lab Results  Component Value Date   VITAMINB12 >1500* 01/10/2015    ASSESSMENT AND PLAN  54 y.o. year old male  has a past medical history of Hypertension;  Diabetes mellitus; Hyperlipemia;  Sleep apnea;  Complex partial seizure ([place]); Migraines; Seizures ([place]); and pseudoseizures here to follow-up.  Continued Vimpat 100 mg twice daily does not need refills Continue Lexapro at current dose Continue seeing counselor weekly, I suggest you also see a psychiatrist for medication management of  anxiety and depression which are causing  Pseudoseizures Additional questions  answered regarding pseudoseizures Follow-up in 6 monthsVst time 20 min Dennie Bible, [facility], [facility], APRN  [facility] [address], [place], [place] [address] [phone]

## 2015-04-25 NOTE — Patient Instructions (Signed)
Continued Vimpat 100 mg twice daily Continue Lexapro at current dose Follow-up in 6 months

## 2015-06-03 ENCOUNTER — Ambulatory Visit (INDEPENDENT_AMBULATORY_CARE_PROVIDER_SITE_OTHER): Payer: 59 | Admitting: Family Medicine

## 2015-06-03 VITALS — BP 110/68 | HR 80 | Temp 97.8°F | Resp 14 | Ht 71.0 in | Wt 229.0 lb

## 2015-06-03 DIAGNOSIS — G40909 Epilepsy, unspecified, not intractable, without status epilepticus: Secondary | ICD-10-CM | POA: Diagnosis not present

## 2015-06-03 DIAGNOSIS — E119 Type 2 diabetes mellitus without complications: Secondary | ICD-10-CM

## 2015-06-03 DIAGNOSIS — K50919 Crohn's disease, unspecified, with unspecified complications: Secondary | ICD-10-CM | POA: Diagnosis not present

## 2015-06-03 LAB — COMPLETE METABOLIC PANEL WITH GFR
ALT: 28 U/L (ref 9–46)
AST: 31 U/L (ref 10–35)
Albumin: 4.1 g/dL (ref 3.6–5.1)
Alkaline Phosphatase: 100 U/L (ref 40–115)
BUN: 15 mg/dL (ref 7–25)
CO2: 24 mmol/L (ref 20–31)
Calcium: 9.2 mg/dL (ref 8.6–10.3)
Chloride: 105 mmol/L (ref 98–110)
Creat: 0.85 mg/dL (ref 0.70–1.33)
GFR, Est African American: >89 mL/min (ref >=60)
GFR, Est Non African American: >89 mL/min (ref >=60)
Glucose, Bld: 147 mg/dL — ABNORMAL HIGH (ref 65–99)
Potassium: 3.8 mmol/L (ref 3.5–5.3)
Sodium: 139 mmol/L (ref 135–146)
Total Bilirubin: 2.3 mg/dL — ABNORMAL HIGH (ref 0.2–1.2)
Total Protein: 7 g/dL (ref 6.1–8.1)

## 2015-06-03 LAB — POCT SEDIMENTATION RATE: POCT SED RATE: 20 mm/hr (ref 0–22)

## 2015-06-03 LAB — MICROALBUMIN, URINE: Microalb, Ur: 5.2 mg/dL

## 2015-06-03 LAB — POCT GLYCOSYLATED HEMOGLOBIN (HGB A1C): Hemoglobin A1C: 5.2

## 2015-06-03 LAB — HEMOGLOBIN A1C: Hgb A1c MFr Bld: 5.2 % (ref 4.0–6.0)

## 2015-06-03 NOTE — Patient Instructions (Signed)
You are still completely disabled.    Keep trying to be active.  Stop the amaryl.  Recheck in 3 months

## 2015-06-03 NOTE — Progress Notes (Signed)
This chart was scribed for [name] Haber, MD by [facility], medical scribe at [facility].The patient was seen in exam room 01 and the patient's care was started at 8:21 AM.  Patient ID: [name] [name] MRN: [id number], DOB: August 12, 1960, 54 y.o. Date of Encounter: 06/03/2015  Primary Physician: [name] Haber, MD  Chief Complaint:  Chief Complaint  Patient presents with  . Follow-up   HPI:  [name] [name] is a 54 y.o. male with a hx of seizures, chron's disease and diabetes who presents to [facility] for a follow up.  Seizures: Has long term disability, also working on social security disability. Struggled Thursday, wife has noticed his conversations are off, and did wake up with a headache that day. He has trouble remembering little things. Sleep on and off this week, he wakes up itching and cant fall back asleep. Doppler, stress test in 2012.   Diabetes: Blood sugar is "fine", has had some lows while trying to exercise. He becomes disoriented and diaphoretic. Highest was 130.   Migraines: Occasional migraines, has not needed medication for this. Last true migraine was 6 months ago.  Hematuria: Hematuria, seen by his urologist for possible kidney stones. CT showed mild splenomegaly. Takes mercaptopurine.   Chron's Disease: Decreased appetite, frequent diarrhea. However, this is unchanged.   Past Medical History  Diagnosis Date  . Hypertension   . GERD (gastroesophageal reflux disease)   . Adenomatous colon polyp 10/1999  . Osteoporosis   . External hemorrhoids   . Crohn's disease of small and large intestines ([place]) 1999  . Pneumonia   . Esophageal stricture   . MRSA (methicillin resistant Staphylococcus aureus) 10/2010   . Diabetes mellitus   . Hyperlipemia   . Cancer ([place])     skin  . Kidney stones   . Sleep apnea     CPAP machine  . PONV (postoperative nausea and vomiting)   . Complex partial seizure ([place])   . Migraines   .  Seizures ([place])   . Cataract   . History of nuclear stress test 11/15/2010    exercise myoview; normal pattern of perfusion; normal, low risk study   . Skin cancer     Home Meds: Prior to Admission medications   Medication Sig Start Date End Date Taking? Authorizing Provider  aspirin 81 MG tablet Take 81 mg by mouth daily.   Yes Historical Provider, MD  CALCIUM PO Take 1 tablet by mouth 3 (three) times daily.    Yes Historical Provider, MD  Cyanocobalamin (VITAMIN B 12 PO) Take 1 tablet by mouth as needed.    Yes Historical Provider, MD  diltiazem (TIAZAC) 180 MG 24 hr capsule Take 1 capsule (180 mg total) by mouth daily. 07/07/14  Yes [name] Haber, MD  escitalopram (LEXAPRO) 10 MG tablet Take 1 tablet (10 mg total) by mouth daily. 02/16/15  Yes [name] Haber, MD  esomeprazole (NEXIUM) 40 MG capsule One capsule by mouth twice daily 02/16/15  Yes [name] Haber, MD  Ferrous Sulfate (IRON SUPPLEMENT PO) Take by mouth.   Yes Historical Provider, MD  fluticasone (FLONASE) 50 MCG/ACT nasal spray Place 2 sprays into both nostrils daily. 07/07/14  Yes [name] Haber, MD  folic acid (FOLVITE) 1 MG tablet Take 2 tablets (2 mg total) by mouth daily. 02/16/15  Yes [name] Haber, MD  glimepiride (AMARYL) 2 MG tablet Take 1 tablet (2 mg total) by mouth daily before breakfast. 02/16/15  Yes [name] Haber, MD  glucose  blood (CHOICE DM FORA G20 TEST STRIPS) test strip Use as instructed 11/13/12  Yes [name] Haber, MD  Lacosamide (VIMPAT) 100 MG TABS Take 100 mg by mouth 2 (two) times daily.   Yes Historical Provider, MD  Lactase (DAIRY-RELIEF PO) Take 3 tablets by mouth 3 (three) times daily.    Yes Historical Provider, MD  mercaptopurine (PURINETHOL) 50 MG tablet Take 2 tablets (100 mg total) by mouth daily. Give on an empty stomach 1 hour before or 2 hours after meals. Caution: Chemotherapy. 01/09/15  Yes Ladene Artist, MD  mesalamine (LIALDA) 1.2 G EC tablet Take 2 tablets (2.4 g total) by mouth  daily. 02/16/15  Yes [name] Haber, MD  metFORMIN (GLUCOPHAGE) 500 MG tablet TAKE 2 TABLETS EVERY EVENING 02/16/15  Yes [name] Haber, MD  methscopolamine (PAMINE FORTE) 5 MG tablet Take 1 tablet (5 mg total) by mouth 2 (two) times daily. 01/09/15  Yes Ladene Artist, MD  metoprolol tartrate (LOPRESSOR) 25 MG tablet TAKE ONE-HALF (1/2) TABLET (12.5 MG) TWICE A DAY ) 02/16/15  Yes [name] Haber, MD  ONE [facility] ULTRA TEST test strip USE AS INSTRUCTED   Yes Mancel Bale, PA-C  [facility] DELICA LANCETS 45Y MISC USE AS INSTRUCTED (NEEDS OFFICE VISIT) 02/04/13  Yes Areta [name] Dunn, PA-C  potassium chloride SA (KLOR-CON M20) 20 MEQ tablet Take 1 tablet (20 mEq total) by mouth 2 (two) times daily. 07/07/14  Yes [name] Haber, MD  saccharomyces boulardii (FLORASTOR) 250 MG capsule Take 250 mg by mouth daily.    Yes Historical Provider, MD  tamsulosin (FLOMAX) 0.4 MG CAPS capsule Take 1 capsule (0.4 mg total) by mouth as needed. 02/16/15  Yes [name] Haber, MD  eletriptan (RELPAX) 40 MG tablet Take 1 tablet at onset of headache, may repeat in 2 hours if headache persists or reoccurs. Patient not taking: Reported on 06/03/2015 01/18/13   Wendie Agreste, MD   Allergies:  Allergies  Allergen Reactions  . Azithromycin Swelling  . Dilaudid [Hydromorphone Hcl] Nausea And Vomiting  . Penicillins Hives  . Sulfonamide Derivatives Hives   Social History   Social History  . Marital Status: Married    Spouse Name: [name] [name]  . Number of Children: 2  . Years of Education: college   Occupational History  . disabled    Social History Main Topics  . Smoking status: Never Smoker   . Smokeless tobacco: Never Used  . Alcohol Use: No  . Drug Use: No  . Sexual Activity: Yes    Birth Control/ Protection: Surgical     Comment: 1 partner in last 12 months   Other Topics Concern  . Not on file   Social History Narrative   Patient is married [name] [name]) and lives at home with his family.   Patient has two children.    Patient has a college education.   Patient is right-handed.   Patient drinks one cup of soda daily.    Review of Systems: Constitutional: negative for chills, fever, night sweats, weight changes, or fatigue.   HEENT: negative for vision changes, hearing loss, congestion, rhinorrhea, ST, epistaxis, or sinus pressure Cardiovascular: negative for chest pain or palpitations Respiratory: negative for hemoptysis, wheezing, shortness of breath, or cough Abdominal: negative for abdominal pain, nausea, vomiting or constipation. Positive for diarrhea Dermatological: negative for rash Neurologic: negative for dizziness, or syncope. Positive for headache. All other systems reviewed and are otherwise negative with the exception to those above and in the HPI.  Physical Exam: Blood  pressure 110/68, pulse 80, temperature 97.8 F (36.6 C), temperature source Oral, resp. rate 14, height 5' 11"  (1.803 m), weight 229 lb (103.874 kg), SpO2 97 %., Body mass index is 31.95 kg/(m^2). General: Well developed, well nourished, in no acute distress. Head: Normocephalic, atraumatic, eyes without discharge, sclera non-icteric, nares are without discharge. Bilateral auditory canals clear, TM's are without perforation, pearly grey and translucent with reflective cone of light bilaterally. Oral cavity moist, posterior pharynx without exudate, erythema, peritonsillar abscess, or post nasal drip.  Neck: Supple. No thyromegaly. Full ROM. No lymphadenopathy. Lungs: Clear bilaterally to auscultation without wheezes, rales, or rhonchi. Breathing is unlabored. Heart: RRR with S1 S2. No murmurs, rubs, or gallops appreciated. Abdomen: Soft, diffuse mild tenderness, non-distended with normoactive bowel sounds. No hepatomegaly. No rebound/guarding.  No HSM.  No obvious abdominal masses. Msk:  Strength and tone normal for age. Extremities/Skin: Warm and dry. No clubbing or cyanosis. No edema. No rashes or suspicious lesions. Neuro:  Alert and oriented X 3. Moves all extremities spontaneously. Gait is normal. CNII-XII grossly in tact. Psych:  Responds to questions appropriately with a normal affect.   Labs: Results for orders placed or performed in visit on 02/16/15  Microalbumin, urine  Result Value Ref Range   Microalb, Ur 2.2 (H) <2.0 mg/dL  Comprehensive metabolic panel  Result Value Ref Range   Sodium 139 135 - 146 mmol/L   Potassium 3.8 3.5 - 5.3 mmol/L   Chloride 105 98 - 110 mmol/L   CO2 23 20 - 31 mmol/L   Glucose, Bld 73 65 - 99 mg/dL   BUN 13 7 - 25 mg/dL   Creat 0.78 0.70 - 1.33 mg/dL   Total Bilirubin 3.1 (H) 0.2 - 1.2 mg/dL   Alkaline Phosphatase 82 40 - 115 U/L   AST 47 (H) 10 - 35 U/L   ALT 35 9 - 46 U/L   Total Protein 7.0 6.1 - 8.1 g/dL   Albumin 4.2 3.6 - 5.1 g/dL   Calcium 9.1 8.6 - 10.3 mg/dL  Lipid panel  Result Value Ref Range   Cholesterol 159 125 - 200 mg/dL   Triglycerides 147 <150 mg/dL   HDL 48 >=40 mg/dL   Total CHOL/HDL Ratio 3.3 <=5.0 Ratio   VLDL 29 <30 mg/dL   LDL Cholesterol 82 <130 mg/dL  C-reactive protein  Result Value Ref Range   CRP <0.5 <0.60 mg/dL  POCT CBC  Result Value Ref Range   WBC 7.2 4.6 - 10.2 K/uL   Lymph, poc 1.6 0.6 - 3.4   POC LYMPH PERCENT 22.0 10 - 50 %L   MID (cbc) 0.5 0 - 0.9   POC MID % 6.8 0 - 12 %M   POC Granulocyte 5.1 2 - 6.9   Granulocyte percent 71.2 37 - 80 %G   RBC 3.79 (A) 4.69 - 6.13 M/uL   Hemoglobin 13.6 (A) 14.1 - 18.1 g/dL   HCT, POC 41.3 (A) 43.5 - 53.7 %   MCV 108.9 (A) 80 - 97 fL   MCH, POC 35.8 (A) 27 - 31.2 pg   MCHC 32.9 31.8 - 35.4 g/dL   RDW, POC 17.0 %   Platelet Count, POC 164 142 - 424 K/uL   MPV 7.7 0 - 99.8 fL  POCT glycosylated hemoglobin (Hb A1C)  Result Value Ref Range   Hemoglobin A1C 5.0      ASSESSMENT AND PLAN:  54 y.o. year old male with Crohn's disease, diabetes, and seizure disorder. He continues to  have problems with memory and recall. Some days he finds himself in a fog and has lost track of  where he is or what is supposed to be doing. This chart was scribed in my presence and reviewed by me personally.    ICD-9-CM ICD-10-CM   1. Controlled type 2 diabetes mellitus without complication, without long-term current use of insulin (HCC) 250.00 E11.9 POCT glycosylated hemoglobin (Hb A1C)     COMPLETE METABOLIC PANEL WITH GFR     Microalbumin, urine  2. Crohn's disease with complication, unspecified gastrointestinal tract location ([place]) 555.9 K50.919 POCT SEDIMENTATION RATE  3. Seizure disorder ([place]) 345.90 G40.909      Signed, [name] Haber, MD   By signing my name below, I, Nadim Abuhashem, attest that this documentation has been prepared under the direction and in the presence of [name] Haber, MD.  Electronically Signed: Lora Havens, medical scribe. 06/03/2015 8:44 AM.

## 2015-06-05 DIAGNOSIS — Z0271 Encounter for disability determination: Secondary | ICD-10-CM

## 2015-06-08 ENCOUNTER — Encounter: Payer: Self-pay | Admitting: Family Medicine

## 2015-07-17 ENCOUNTER — Other Ambulatory Visit: Payer: Self-pay | Admitting: Neurology

## 2015-07-17 MED ORDER — LACOSAMIDE 100 MG PO TABS: 100.0000 mg | ORAL_TABLET | Freq: Two times a day (BID) | ORAL | Status: DC

## 2015-07-17 NOTE — Telephone Encounter (Signed)
Patient is calling and needs Rx Lacosamide 100 mg tablets 90 day supply called in to [facility].  Thanks!

## 2015-07-27 DIAGNOSIS — Z0271 Encounter for disability determination: Secondary | ICD-10-CM

## 2015-08-05 ENCOUNTER — Other Ambulatory Visit: Payer: Self-pay | Admitting: Family Medicine

## 2015-08-08 ENCOUNTER — Encounter: Payer: Self-pay | Admitting: Podiatry

## 2015-08-08 ENCOUNTER — Ambulatory Visit (INDEPENDENT_AMBULATORY_CARE_PROVIDER_SITE_OTHER): Payer: 59 | Admitting: Podiatry

## 2015-08-08 VITALS — BP 135/89 | HR 69 | Resp 12

## 2015-08-08 DIAGNOSIS — L84 Corns and callosities: Secondary | ICD-10-CM | POA: Diagnosis not present

## 2015-08-08 DIAGNOSIS — M722 Plantar fascial fibromatosis: Secondary | ICD-10-CM

## 2015-08-08 NOTE — Progress Notes (Signed)
   Subjective:    Patient ID: [name] [name], male    DOB: 1960-09-18, 55 y.o.   MRN: [id number]  HPI    This patient presents today for 2 concerns. His first concern is pain in the fourth left toe for 9 months aggravated with standing walking relieved with rest and removal of the shoe. Patient is said no self treatment.. Patient is disabled secondary to seizure disorders as well as memory disorders and walks 6-7 days a week 6-12 miles for at least 6+ months. He also is complaining of left arch pain for 5 months noticeable first abdomen morning with prolonged standing walking. Patient said no specific treatment for this problem.  Patient is diabetic denies any history of claudication, amputation or foot ulceration    Review of Systems  Neurological: Positive for dizziness.  Hematological: Bruises/bleeds easily.  Psychiatric/Behavioral: The patient is nervous/anxious.        Objective:   Physical Exam  Patient appears orientated responsive 3 with his wife present in the treatment room  Vascular: No peripheral edema bilaterally DP pulses 1/4 bilaterally PT pulses 2/4 bilaterally Capillary reflex immediate bilaterally  Neurological: Sensation to 10 g monofilament wire intact 5/5 bilaterally Vibratory sensation reactive bilaterally Ankle reflex equal and reactive bilaterally  Dermatological: No open skin lesions bilaterally Small keratoses lateral fourth left toe and distal medial fifth left toe.  Musculoskeletal: Palpable tenderness mid aspect of medial fascial/central band without a palpable lesions. This duplicates patient's discomfort in the left arch There is no crepitus, or pain on range of motion of ankle, subtalar, midtarsal joints bilaterally      Assessment & Plan:    Assessment: Satisfactory neurovascular status Keratoses 2 fourths and fifth left toes Plantar fasciitis left  Plan: Today review the results of examination with patient and wife Dispensed a  gel sleeve to wear the fourth left toe Keratoses 2 debrided Custom use a Vaseline and possible Band-Aids over keratoses fourth and fifth left toe with caution about removing Band-Aids to avoid skin tears  Discussed plantar fasciitis left Demonstrated application of sports a 2+ inches over prewrap over mid arch left foot do not overtighten, apply daily Recommended reducing 3 hours a continuous walking to 1.5 hours of continuous walking and substitute exercise bike Discussed stretching for plantar fasciitis  Discussed general diabetic foot care  Reappoint at patient's request or yearly

## 2015-08-08 NOTE — Patient Instructions (Signed)
I recommended you wearing shoes at all times We shoes on bent knee stretching 2-3 times 2-3 minutes daily Reduce walking from 3 hours daily at one time , and 1.5 hours daily Substitute exercise bike Over prewrap on left arch loosely apply to two inch sports tape to support left arch on a daily basis   Plantar Fasciitis Plantar fasciitis is a painful foot condition that affects the heel. It occurs when the band of tissue that connects the toes to the heel bone (plantar fascia) becomes irritated. This can happen after exercising too much or doing other repetitive activities (overuse injury). The pain from plantar fasciitis can range from mild irritation to severe pain that makes it difficult for you to walk or move. The pain is usually worse in the morning or after you have been sitting or lying down for a while. CAUSES This condition may be caused by:  Standing for long periods of time.  Wearing shoes that do not fit.  Doing high-impact activities, including running, aerobics, and ballet.  Being overweight.  Having an abnormal way of walking (gait).  Having tight calf muscles.  Having high arches in your feet.  Starting a new athletic activity. SYMPTOMS The main symptom of this condition is heel pain. Other symptoms include:  Pain that gets worse after activity or exercise.  Pain that is worse in the morning or after resting.  Pain that goes away after you walk for a few minutes. DIAGNOSIS This condition may be diagnosed based on your signs and symptoms. Your health care provider will also do a physical exam to check for:  A tender area on the bottom of your foot.  A high arch in your foot.  Pain when you move your foot.  Difficulty moving your foot. You may also need to have imaging studies to confirm the diagnosis. These can include:  X-rays.  Ultrasound.  MRI. TREATMENT  Treatment for plantar fasciitis depends on the severity of the condition. Your treatment  may include:  Rest, ice, and over-the-counter pain medicines to manage your pain.  Exercises to stretch your calves and your plantar fascia.  A splint that holds your foot in a stretched, upward position while you sleep (night splint).  Physical therapy to relieve symptoms and prevent problems in the future.  Cortisone injections to relieve severe pain.  Extracorporeal shock wave therapy (ESWT) to stimulate damaged plantar fascia with electrical impulses. It is often used as a last resort before surgery.  Surgery, if other treatments have not worked after 12 months. HOME CARE INSTRUCTIONS  Take medicines only as directed by your health care provider.  Avoid activities that cause pain.  Roll the bottom of your foot over a bag of ice or a bottle of cold water. Do this for 20 minutes, 3-4 times a day.  Perform simple stretches as directed by your health care provider.  Try wearing athletic shoes with air-sole or gel-sole cushions or soft shoe inserts.  Wear a night splint while sleeping, if directed by your health care provider.  Keep all follow-up appointments with your health care provider. PREVENTION   Do not perform exercises or activities that cause heel pain.  Consider finding low-impact activities if you continue to have problems.  Lose weight if you need to. The best way to prevent plantar fasciitis is to avoid the activities that aggravate your plantar fascia. SEEK MEDICAL CARE IF:  Your symptoms do not go away after treatment with home care measures.  Your pain  gets worse.  Your pain affects your ability to move or do your daily activities.   This information is not intended to replace advice given to you by your health care provider. Make sure you discuss any questions you have with your health care provider.   Document Released: 03/05/2001 Document Revised: 03/01/2015 Document Reviewed: 04/20/2014 Elsevier Interactive Patient Education 2016 [place]. Diabetes and Foot Care Diabetes may cause you to have problems because of poor blood supply (circulation) to your feet and legs. This may cause the skin on your feet to become thinner, break easier, and heal more slowly. Your skin may become dry, and the skin may peel and crack. You may also have nerve damage in your legs and feet causing decreased feeling in them. You may not notice minor injuries to your feet that could lead to infections or more serious problems. Taking care of your feet is one of the most important things you can do for yourself.  HOME CARE INSTRUCTIONS  Wear shoes at all times, even in the house. Do not go barefoot. Bare feet are easily injured.  Check your feet daily for blisters, cuts, and redness. If you cannot see the bottom of your feet, use a mirror or ask someone for help.  Wash your feet with warm water (do not use hot water) and mild soap. Then pat your feet and the areas between your toes until they are completely dry. Do not soak your feet as this can dry your skin.  Apply a moisturizing lotion or petroleum jelly (that does not contain alcohol and is unscented) to the skin on your feet and to dry, brittle toenails. Do not apply lotion between your toes.  Trim your toenails straight across. Do not dig under them or around the cuticle. File the edges of your nails with an emery board or nail file.  Do not cut corns or calluses or try to remove them with medicine.  Wear clean socks or stockings every day. Make sure they are not too tight. Do not wear knee-high stockings since they may decrease blood flow to your legs.  Wear shoes that fit properly and have enough cushioning. To break in new shoes, wear them for just a few hours a day. This prevents you from injuring your feet. Always look in your shoes before you put them on to be sure there are no objects inside.  Do not cross your legs. This may decrease the blood flow to your feet.  If you find a minor  scrape, cut, or break in the skin on your feet, keep it and the skin around it clean and dry. These areas may be cleansed with mild soap and water. Do not cleanse the area with peroxide, alcohol, or iodine.  When you remove an adhesive bandage, be sure not to damage the skin around it.  If you have a wound, look at it several times a day to make sure it is healing.  Do not use heating pads or hot water bottles. They may burn your skin. If you have lost feeling in your feet or legs, you may not know it is happening until it is too late.  Make sure your health care provider performs a complete foot exam at least annually or more often if you have foot problems. Report any cuts, sores, or bruises to your health care provider immediately. SEEK MEDICAL CARE IF:   You have an injury that is not healing.  You have cuts or  breaks in the skin.  You have an ingrown nail.  You notice redness on your legs or feet.  You feel burning or tingling in your legs or feet.  You have pain or cramps in your legs and feet.  Your legs or feet are numb.  Your feet always feel cold. SEEK IMMEDIATE MEDICAL CARE IF:   There is increasing redness, swelling, or pain in or around a wound.  There is a red line that goes up your leg.  Pus is coming from a wound.  You develop a fever or as directed by your health care provider.  You notice a bad smell coming from an ulcer or wound.   This information is not intended to replace advice given to you by your health care provider. Make sure you discuss any questions you have with your health care provider.   Document Released: 06/07/2000 Document Revised: 02/10/2013 Document Reviewed: 11/17/2012 Elsevier Interactive Patient Education [employer].

## 2015-08-10 ENCOUNTER — Telehealth: Payer: Self-pay | Admitting: *Deleted

## 2015-08-10 NOTE — Telephone Encounter (Signed)
Patient [employer] form on [employer].

## 2015-08-15 ENCOUNTER — Telehealth: Payer: Self-pay | Admitting: *Deleted

## 2015-08-15 ENCOUNTER — Telehealth: Payer: Self-pay | Admitting: Neurology

## 2015-08-15 NOTE — Telephone Encounter (Signed)
Left message for a return call - need to discuss his LTD paperwork.

## 2015-08-15 NOTE — Telephone Encounter (Addendum)
Patient called back and I was unavailable - he would like a call back after 3pm.

## 2015-08-15 NOTE — Telephone Encounter (Signed)
Duplicate encounter - see other note.

## 2015-08-15 NOTE — Telephone Encounter (Signed)
Patient called back and would like a return call after 3:00 today.  Thanks

## 2015-08-15 NOTE — Telephone Encounter (Signed)
His last LTD paperwork was completed by his PCP, Dr. Joseph Art.  His multiple medical issues were listed on this paperwork.  His workup here, per Dr. Krista Blue, indicates pseudoseizures.  It is better to allow the PCP to complete so that his memory, depression, Crohns, Diabetes can be included - these are concerns that we do not treat him for here.  [facility] is aware and will reroute his paperwork to them.  No fees have been charged to the patient.  Patient is aware.

## 2015-09-07 DIAGNOSIS — L84 Corns and callosities: Secondary | ICD-10-CM

## 2015-09-08 DIAGNOSIS — Z0271 Encounter for disability determination: Secondary | ICD-10-CM

## 2015-10-12 ENCOUNTER — Other Ambulatory Visit: Payer: Self-pay | Admitting: Family Medicine

## 2015-10-23 ENCOUNTER — Ambulatory Visit (INDEPENDENT_AMBULATORY_CARE_PROVIDER_SITE_OTHER): Payer: 59 | Admitting: Nurse Practitioner

## 2015-10-23 ENCOUNTER — Encounter: Payer: Self-pay | Admitting: Nurse Practitioner

## 2015-10-23 ENCOUNTER — Telehealth: Payer: Self-pay | Admitting: Neurology

## 2015-10-23 VITALS — BP 109/71 | HR 69 | Ht 71.0 in | Wt 227.6 lb

## 2015-10-23 DIAGNOSIS — G4733 Obstructive sleep apnea (adult) (pediatric): Secondary | ICD-10-CM | POA: Diagnosis not present

## 2015-10-23 DIAGNOSIS — Z5181 Encounter for therapeutic drug level monitoring: Secondary | ICD-10-CM | POA: Diagnosis not present

## 2015-10-23 DIAGNOSIS — G40209 Localization-related (focal) (partial) symptomatic epilepsy and epileptic syndromes with complex partial seizures, not intractable, without status epilepticus: Secondary | ICD-10-CM

## 2015-10-23 DIAGNOSIS — R413 Other amnesia: Secondary | ICD-10-CM

## 2015-10-23 DIAGNOSIS — R569 Unspecified convulsions: Secondary | ICD-10-CM

## 2015-10-23 MED ORDER — ALPRAZOLAM 0.5 MG PO TABS: ORAL_TABLET | ORAL | Status: DC

## 2015-10-23 NOTE — Patient Instructions (Addendum)
Will check Vimpat level BMP for MRI  Repeat MRI of the brain and compare to 2015 Need Follow up with Dr. Brett Fairy for sleep issues F/U in 6 months with me

## 2015-10-23 NOTE — Telephone Encounter (Signed)
Patient is requesting xanax prior to his MRI appointment on Wednesday 5/10 @ [facility]. Thanks!

## 2015-10-23 NOTE — Progress Notes (Signed)
I have reviewed and agreed above plan. 

## 2015-10-23 NOTE — Telephone Encounter (Signed)
Dr. Krista Blue has provided him Xanax, per MRI protocol.  Called patient to let him know it has been faxed to [facility] on [address].

## 2015-10-23 NOTE — Telephone Encounter (Signed)
Patient returned Michelle's call regarding Rx, states message was hard to understand. Patient advised Xanax has been faxed to [facility] on [address].

## 2015-10-23 NOTE — Progress Notes (Signed)
[facility]  PATIENT: [name] [name] DOB: 24-Dec-1960   REASON FOR VISIT: follow up for seizure disorder, OSA, nonepileptic episodes and new complaint of worsening memory HISTORY FROM: Patient and wife    HISTORY OF PRESENT ILLNESS: HISTORY:YY [name] [name] is a 55 years old right-handed male, accompanied by his wife, was seen in refer by his primary care physician Dr. [name] for evaluation of seizure He had a past medical history of diabetes, hypertension, Crohn's disease, on immunosuppressive treatment, He presented with seizure-like event, reported initial event was June 28 2014, he was found by his wife to difficulty to be awake and eyes closed, took a while for him to open his eyes, and become very confused, also angry, episodes last about 5-10 minutes, there was also witnessed leg extension, jerking movement, He had multiple recurrent confusion episodes later, MRI of the brain, MRA of the brain in April 2013 was normal, EEG showed intermittent left anterior and medial temporal lobe slowing He later was also diagnosed with severe obstructive sleep apnea, using CPAP machine UPDATE August 1 st 2016:YYHe continue have recurrent episode while taking Vimpat 200 mg twice a day, and titrating dose of Keppra,  I have reviewed and summarized his video EEG monitoring from [facility] in June 17 to December 14 2014, patient reported different spells, 1, memory/sleepiness, preceded by headaches, often followed by right arm numbness, 2, staring and oral automatism, Video EEG monitoring was carried out during withdraw of the admission antiseizure medications, as well as sleep deprivation, for typical example of type I spells were recorded, with overall findings suggesting the diagnosis of psychogenic nonepileptic spells, is referred to psychotherapy, Keppra was tapered off,  Interictal EEG was normal, non-of the type II spells were recorded, clinical impression is that spell  type II is likely to be focal seizure, he was advised to keep Vimpat, decreased the dosage from 200 mg twice a day to 100 mg twice a day, He is also taking Lexapro 10 mg daily, which has helped his depression anxiety, UPDATE 04/25/2015 CMMr. 37, 55 year old male returns for follow-up. He has a history of obstructive sleep apnea with CPAP, focal seizures, and pseudoseizures. He is currently seeing a counselor Hilton Cork once a week and he has about 1 pseudoseizure per week. He is not seen a psychiatrist. He has felt counseling effective. He is now walking for exercise. He remains on Vimpat 100 twice daily without side effects. He is also on Lexapro. He returns for reevaluation UPDATE 10/23/2015 [name], 55 year old male returns for follow-up. He was last seen in the office 04/25/2015. Since that time the wife has noted several episodes of lip smacking which occurred in sleep. He continues to be compliant with CPAP however has not had a follow-up in 2 years with Dr. Brett Fairy. He also complains of worsening memory today last MRI of the brain in 2015. He has problems remembering names or identifying objects. MMSE today 28 out of 30. He continues to see a counselor every other week. He is on Lexapro for depression. Also has a history of diabetes and most recent hemoglobin A1c 5.2. He returns for reevaluation    REVIEW OF SYSTEMS: Full 14 system review of systems performed and notable only for those listed, all others are neg:  Constitutional: neg  Cardiovascular: neg Ear/Nose/Throat: neg  Skin: neg Eyes: neg Respiratory: neg Gastroitestinal: neg  Hematology/Lymphatic: neg  Endocrine: neg Musculoskeletal:neg Allergy/Immunology: neg Neurology Memory loss, seizure, headache Psychiatric Anxiety depression Sleep obstructive sleep  apnea on CPAP  ALLERGIES: Allergies  Allergen Reactions  . Azithromycin Swelling  . Dilaudid [Hydromorphone Hcl] Nausea And Vomiting  . Penicillins Hives  .  Sulfonamide Derivatives Hives    HOME MEDICATIONS: Outpatient Prescriptions Prior to Visit  Medication Sig Dispense Refill  . aspirin 81 MG tablet Take 81 mg by mouth daily.    Marland Kitchen CALCIUM PO Take 1 tablet by mouth 3 (three) times daily.     . Cyanocobalamin (VITAMIN B 12 PO) Take 1 tablet by mouth as needed.     . diltiazem (TIAZAC) 180 MG 24 hr capsule Take 1 capsule (180 mg total) by mouth daily. 90 capsule 3  . eletriptan (RELPAX) 40 MG tablet Take 1 tablet at onset of headache, may repeat in 2 hours if headache persists or reoccurs. 12 tablet 4  . escitalopram (LEXAPRO) 10 MG tablet Take 1 tablet (10 mg total) by mouth daily. 90 tablet 3  . esomeprazole (NEXIUM) 40 MG capsule One capsule by mouth twice daily 180 capsule 3  . Ferrous Sulfate (IRON SUPPLEMENT PO) Take by mouth.    . fluticasone (FLONASE) 50 MCG/ACT nasal spray USE 2 SPRAYS IN EACH NOSTRIL DAILY 16 g 0  . folic acid (FOLVITE) 1 MG tablet Take 2 tablets (2 mg total) by mouth daily. 180 tablet 3  . glucose blood (CHOICE DM FORA G20 TEST STRIPS) test strip Use as instructed 100 each 12  . Lacosamide (VIMPAT) 100 MG TABS Take 1 tablet (100 mg total) by mouth 2 (two) times daily. 180 tablet 1  . Lactase (DAIRY-RELIEF PO) Take 3 tablets by mouth 3 (three) times daily.     . mercaptopurine (PURINETHOL) 50 MG tablet Take 2 tablets (100 mg total) by mouth daily. Give on an empty stomach 1 hour before or 2 hours after meals. Caution: Chemotherapy. 180 tablet 3  . mesalamine (LIALDA) 1.2 G EC tablet Take 2 tablets (2.4 g total) by mouth daily. 180 tablet 3  . metFORMIN (GLUCOPHAGE) 500 MG tablet TAKE 2 TABLETS EVERY EVENING 180 tablet 3  . methscopolamine (PAMINE FORTE) 5 MG tablet Take 1 tablet (5 mg total) by mouth 2 (two) times daily. 180 tablet 3  . metoprolol tartrate (LOPRESSOR) 25 MG tablet TAKE ONE-HALF (1/2) TABLET (12.5 MG) TWICE A DAY ) 90 tablet 3  . ONE TOUCH ULTRA TEST test strip USE AS INSTRUCTED 100 each 2  . ONETOUCH  DELICA LANCETS 16X MISC USE AS INSTRUCTED (NEEDS OFFICE VISIT) 100 each 0  . potassium chloride SA (K-DUR,KLOR-CON) 20 MEQ tablet TAKE 1 TABLET TWICE A DAY 180 tablet 2  . saccharomyces boulardii (FLORASTOR) 250 MG capsule Take 250 mg by mouth daily.     . tamsulosin (FLOMAX) 0.4 MG CAPS capsule Take 1 capsule (0.4 mg total) by mouth as needed. 30 capsule 11   No facility-administered medications prior to visit.    PAST MEDICAL HISTORY: Past Medical History  Diagnosis Date  . Hypertension   . GERD (gastroesophageal reflux disease)   . Adenomatous colon polyp 10/1999  . Osteoporosis   . External hemorrhoids   . Crohn's disease of small and large intestines ([place]) 1999  . Pneumonia   . Esophageal stricture   . MRSA (methicillin resistant Staphylococcus aureus) 10/2010   . Diabetes mellitus   . Hyperlipemia   . Cancer ([place])     skin  . Kidney stones   . Sleep apnea     CPAP machine  . PONV (postoperative nausea and vomiting)   .  Complex partial seizure ([place])   . Migraines   . Seizures ([place])   . Cataract   . History of nuclear stress test 11/15/2010    exercise myoview; normal pattern of perfusion; normal, low risk study   . Skin cancer     PAST SURGICAL HISTORY: Past Surgical History  Procedure Laterality Date  . Shoulder surgery    . Sinus surgery with instatrak    . Elbow surgery  2012    elbow MRSA infection   . Cataract extraction Bilateral   . Vasectomy    . Eye surgery    . Transthoracic echocardiogram  02/22/2011    EF 55-65%; increased pattern of LVH with mild conc hypertrophy, abnormal relaxation & increased filling pressure (grade 2 diastolic dysfunction); atrial septum thickened (lipomatous hypertrophy)    FAMILY HISTORY: Family History  Problem Relation Age of Onset  . Colon polyps Father   . Heart disease Father     HTN, aneursym  . Stroke Father   . Hyperlipidemia Father   . Hypertension Father   . Heart disease Mother     CABG at age 7  . Stroke  Mother   . Hyperlipidemia Mother   . Hypertension Mother   . Colon cancer Neg Hx   . Stroke Paternal Grandmother   . Stroke Paternal Grandfather   . Other Child     tetrology of fallot (cornealia deland syndrome)    SOCIAL HISTORY: Social History   Social History  . Marital Status: Married    Spouse Name: [name]  . Number of Children: 2  . Years of Education: college   Occupational History  . disabled    Social History Main Topics  . Smoking status: Never Smoker   . Smokeless tobacco: Never Used  . Alcohol Use: No  . Drug Use: No  . Sexual Activity: Yes    Birth Control/ Protection: Surgical     Comment: 1 partner in last 12 months   Other Topics Concern  . Not on file   Social History Narrative   Patient is married [name]) and lives at home with his family.   Patient has two children.   Patient has a college education.   Patient is right-handed.   Patient drinks one cup of soda daily.     PHYSICAL EXAM  Filed Vitals:   10/23/15 0812  BP: 109/71  Pulse: 69  Height: 5' 11"  (1.803 m)  Weight: 227 lb 9.6 oz (103.239 kg)   Body mass index is 31.76 kg/(m^2). Generalized: Well developed, obese male in no acute distress  Head: normocephalic and atraumatic,. Oropharynx benign  Neck: Supple, no carotid bruits  Cardiac: Regular rate rhythm, no murmur  Musculoskeletal: No deformity   Neurological examination   Mentation: Alert oriented to time, place, history taking. Attention span and concentration appropriate. MMSE 28/30. AFT 7. Clock drawing 4/4.  Follows all commands speech and language fluent.   Cranial nerve II-XII: Fundoscopic exam reveals sharp disc margins.Pupils were equal round reactive to light extraocular movements were full, visual field were full on confrontational test. Facial sensation and strength were normal. hearing was intact to finger rubbing bilaterally. Uvula tongue midline. head turning and shoulder shrug were normal and  symmetric.Tongue protrusion into cheek strength was normal. Motor: normal bulk and tone, full strength in the BUE, BLE, fine finger movements normal, no pronator drift. No focal weakness Sensory: Mildly length dependent decreased light touch pinprick and vibratory at toes  Coordination: finger-nose-finger, heel-to-shin bilaterally, no dysmetria  Reflexes: Brachioradialis 2/2, biceps 2/2, triceps 2/2, patellar 2/2, Achilles 2/2, plantar responses were flexor bilaterally. Gait and Station: Rising up from seated position without assistance, normal stance, moderate stride, good arm swing, smooth turning, able to perform tiptoe, and heel walking without difficulty. Tandem gait is mildly unsteady DIAGNOSTIC DATA (LABS, IMAGING, TESTING) - I reviewed patient records, labs, notes, testing and imaging myself where available.  Lab Results  Component Value Date   WBC 7.2 02/16/2015   HGB 13.6* 02/16/2015   HCT 41.3* 02/16/2015   MCV 108.9* 02/16/2015   PLT 157.0 01/09/2015      Component Value Date/Time   NA 139 06/03/2015 0858   K 3.8 06/03/2015 0858   CL 105 06/03/2015 0858   CO2 24 06/03/2015 0858   GLUCOSE 147* 06/03/2015 0858   BUN 15 06/03/2015 0858   CREATININE 0.85 06/03/2015 0858   CREATININE 0.85 01/09/2015 1637   CALCIUM 9.2 06/03/2015 0858   PROT 7.0 06/03/2015 0858   ALBUMIN 4.1 06/03/2015 0858   AST 31 06/03/2015 0858   ALT 28 06/03/2015 0858   ALKPHOS 100 06/03/2015 0858   BILITOT 2.3* 06/03/2015 0858   GFRNONAA >89 06/03/2015 0858   GFRNONAA >90 01/04/2013 2155   GFRAA >89 06/03/2015 0858   GFRAA >90 01/04/2013 2155   Lab Results  Component Value Date   CHOL 159 02/16/2015   HDL 48 02/16/2015   LDLCALC 82 02/16/2015   TRIG 147 02/16/2015   CHOLHDL 3.3 02/16/2015   Lab Results  Component Value Date   HGBA1C 5.2 06/03/2015   Lab Results  Component Value Date   VITAMINB12 >1500* 01/10/2015   Lab Results  Component Value Date   TSH 1.715 09/29/2011       ASSESSMENT AND PLAN 55 y.o. year old male has a past medical history of Hypertension; Diabetes mellitus; Hyperlipemia; Sleep apnea; Complex partial seizure ([place]); Migraines; Seizures ([place]); and pseudoseizures here to follow-up.He reports worsening of memory today.    Will check Vimpat level continue at current dose for now BMP for MRI  Continue Lexapro at current dose Continue seeing a counselor every other week Repeat MRI of the brain and compare to 2015 Need Follow up with Dr. Brett Fairy for sleep issues F/U in 6 months with me Vst time 30 min Dennie Bible, [facility], [facility], [facility] [address] [place], [place] [address] [phone]

## 2015-10-25 LAB — LACOSAMIDE: LACOSAMIDE: 6 ug/mL (ref 5.0–10.0)

## 2015-10-25 LAB — BASIC METABOLIC PANEL
BUN/Creatinine Ratio: 14 (ref 9–20)
BUN: 14 mg/dL (ref 6–24)
CALCIUM: 8.9 mg/dL (ref 8.7–10.2)
CHLORIDE: 104 mmol/L (ref 96–106)
CO2: 22 mmol/L (ref 18–29)
Creatinine, Ser: 0.97 mg/dL (ref 0.76–1.27)
GFR calc Af Amer: 103 mL/min/{1.73_m2} (ref >59)
GFR calc non Af Amer: 89 mL/min/{1.73_m2} (ref >59)
GLUCOSE: 137 mg/dL — AB (ref 65–99)
POTASSIUM: 4.7 mmol/L (ref 3.5–5.2)
SODIUM: 143 mmol/L (ref 134–144)

## 2015-10-25 NOTE — Progress Notes (Signed)
Quick Note:  [name] [name] - will explain patient's labs . ______

## 2015-10-25 NOTE — Telephone Encounter (Signed)
Called patient to notify of lab results.

## 2015-10-25 NOTE — Telephone Encounter (Signed)
Sharyn Lull will you tell patient about his labs please. Thanks Hinton Dyer .

## 2015-10-31 ENCOUNTER — Encounter: Payer: Self-pay | Admitting: Neurology

## 2015-10-31 ENCOUNTER — Ambulatory Visit (INDEPENDENT_AMBULATORY_CARE_PROVIDER_SITE_OTHER): Payer: 59 | Admitting: Neurology

## 2015-10-31 VITALS — BP 118/70 | HR 86 | Resp 20 | Ht 71.0 in | Wt 228.0 lb

## 2015-10-31 DIAGNOSIS — Z9989 Dependence on other enabling machines and devices: Principal | ICD-10-CM

## 2015-10-31 DIAGNOSIS — G4733 Obstructive sleep apnea (adult) (pediatric): Secondary | ICD-10-CM | POA: Diagnosis not present

## 2015-10-31 NOTE — Progress Notes (Signed)
[facility]  PATIENT: [name] [name] DOB: Oct 06, 1960   REASON FOR VISIT: follow up for seizure disorder, OSA, nonepileptic episodes and new complaint of worsening memory HISTORY FROM: Patient   [name] [name] presents today for his yearly compliance visit. He has used this machine 100% of the last 30 days over 4 hours, with an average of 7 hours and 34 minutes if that pressure is 10 cm water full-time EPR his residual AHI is 2.7 which is an excellent resolution of apnea. He does have occasional high air leaks. He reports that often his mask dislodges when he turns over at night. However this has not influenced his apnea index and as this remains low I am happy with his performance so far. If he would like to change to another interface I will offer him that. He could not tolerate well just the nasal pillow so he is using a full face mask. It is sometimes difficult to achieve seal with a larger surface been covered.  (He may need to shave regularly.) He feels a lot better since using CPAP.     REVIEW OF SYSTEMS: Full 14 system review of systems performed and notable only for those listed, all others are neg:   The patient's Epworth sleepiness score is 18 points which is a high rating, fatigue severity is 47 which is equally high. He is in ongoing treatment for depression. Neurology Memory loss, seizure, headache Psychiatric Anxiety depression Sleep obstructive sleep apnea on CPAP    HOME MEDICATIONS: reviewed.   PAST MEDICAL HISTORY: Past Medical History  Diagnosis Date  . Hypertension   . GERD (gastroesophageal reflux disease)   . Adenomatous colon polyp 10/1999  . Osteoporosis   . External hemorrhoids   . Crohn's disease of small and large intestines ([place]) 1999  . Pneumonia   . Esophageal stricture   . MRSA (methicillin resistant Staphylococcus aureus) 10/2010   . Diabetes mellitus   . Hyperlipemia   . Cancer ([place])     skin  . Kidney stones   . Sleep apnea     CPAP machine  . PONV (postoperative nausea and vomiting)   . Complex partial seizure ([place])   . Migraines   . Seizures ([place])   . Cataract   . History of nuclear stress test 11/15/2010    exercise myoview; normal pattern of perfusion; normal, low risk study   . Skin cancer     PAST SURGICAL HISTORY: Past Surgical History  Procedure Laterality Date  . Shoulder surgery    . Sinus surgery with instatrak    . Elbow surgery  2012    elbow MRSA infection   . Cataract extraction Bilateral   . Vasectomy    . Eye surgery    . Transthoracic echocardiogram  02/22/2011    EF 55-65%; increased pattern of LVH with mild conc hypertrophy, abnormal relaxation & increased filling pressure (grade 2 diastolic dysfunction); atrial septum thickened (lipomatous hypertrophy)    FAMILY HISTORY: Family History  Problem Relation Age of Onset  . Colon polyps Father   . Heart disease Father     HTN, aneursym  . Stroke Father   . Hyperlipidemia Father   . Hypertension Father   . Heart disease Mother     CABG at age 1  . Stroke Mother   . Hyperlipidemia Mother   . Hypertension Mother   . Colon cancer Neg Hx   . Stroke Paternal Grandmother   . Stroke Paternal Grandfather   .  Other Child     tetrology of fallot (cornealia deland syndrome)   CORNELIA DE LANGE SYNDROME - child    SOCIAL HISTORY: Social History   Social History  . Marital Status: Married    Spouse Name: [name] [name]  . Number of Children: 2  . Years of Education: college   Occupational History  . disabled    Social History Main Topics  . Smoking status: Never Smoker   . Smokeless tobacco: Never Used  . Alcohol Use: No  . Drug Use: No  . Sexual Activity: Yes    Birth Control/ Protection: Surgical     Comment: 1 partner in last 12 months   Other Topics Concern  . Not on file   Social History Narrative   Patient is married [name] [name]) and lives at home with his family.   Patient has two children.   Patient has a college  education.   Patient is right-handed.   Patient drinks one cup of soda daily.     PHYSICAL EXAM  Filed Vitals:   10/31/15 0955  BP: 118/70  Pulse: 86  Resp: 20  Height: 5' 11"  (1.803 m)  Weight: 228 lb (103.42 kg)   Body mass index is 31.81 kg/(m^2). Generalized: Well developed, obese male in no acute distress  Head: normocephalic and atraumatic,. Oropharynx benign  The patient has a neck circumference of 17-1/2 inches, Mallampati grade 3, no sign of tongue bite. Neck: Supple, no carotid bruits  Cardiac: Regular rate rhythm, no murmur  Musculoskeletal: No deformity   Neurological examination   Mentation: Alert oriented to time, place, history taking. Attention span and concentration appropriate. MMSE 28/30. AFT 7. Clock drawing 4/4.  Follows all commands speech and language fluent.   Cranial nerve;  Pupils were equal round reactive to light extraocular movements were full, visual field were full on confrontational test. Facial sensation and strength were normal. hearing was intact to finger rubbing bilaterally. Uvula tongue midline. head turning and shoulder shrug were normal and symmetric.Tongue protrusion into cheek strength was normal. Motor: normal bulk and tone, full strength in the BUE, BLE, fine finger movements normal, no pronator drift. No focal weakness Sensory: Mildly length dependent decreased light touch pinprick and vibratory at toes  Coordination: finger-nose-finger, heel-to-shin bilaterally, no dysmetria Reflexes: Bilateral equal 2+. Gait and Station: Rising up from seated position without assistance, normal stance, moderate stride, good arm swing, smooth turning, able to perform tiptoe, and heel walking without difficulty. Tandem gait is mildly unsteady DIAGNOSTIC DATA (LABS, IMAGING, TESTING)   Lab Results  Component Value Date   WBC 7.2 02/16/2015   HGB 13.6* 02/16/2015   HCT 41.3* 02/16/2015   MCV 108.9* 02/16/2015   PLT 157.0 01/09/2015        Component Value Date/Time   NA 143 10/23/2015 0932   NA 139 06/03/2015 0858   K 4.7 10/23/2015 0932   CL 104 10/23/2015 0932   CO2 22 10/23/2015 0932   GLUCOSE 137* 10/23/2015 0932   GLUCOSE 147* 06/03/2015 0858   BUN 14 10/23/2015 0932   BUN 15 06/03/2015 0858   CREATININE 0.97 10/23/2015 0932   CREATININE 0.85 06/03/2015 0858   CALCIUM 8.9 10/23/2015 0932   PROT 7.0 06/03/2015 0858   ALBUMIN 4.1 06/03/2015 0858   AST 31 06/03/2015 0858   ALT 28 06/03/2015 0858   ALKPHOS 100 06/03/2015 0858   BILITOT 2.3* 06/03/2015 0858   GFRNONAA 89 10/23/2015 0932   GFRNONAA >89 06/03/2015 0858   GFRAA 103 10/23/2015  0932   GFRAA >89 06/03/2015 0858   Lab Results  Component Value Date   CHOL 159 02/16/2015   HDL 48 02/16/2015   LDLCALC 82 02/16/2015   TRIG 147 02/16/2015   CHOLHDL 3.3 02/16/2015   Lab Results  Component Value Date   HGBA1C 5.2 06/03/2015   Lab Results  Component Value Date   VITAMINB12 >1500* 01/10/2015   Lab Results  Component Value Date   TSH 1.715 09/29/2011      ASSESSMENT AND PLAN 55 y.o. year old right handed caucasian married male, here for a 15 minute compliance visit dedicated to CPAP use and sleep apnea and its clinical impact only. Multiple 50% of the face-to-face time is used for discussion of the results, I pointed out the high air leak, but the consistently low AHI. Coordination of care is with Dr. Krista Blue and Nurse practitioner Hassell Done, primary care physician is Dr. Joseph Art. Hehas a past medical history of  OSA on CPAP, Hypertension;depression, Complex partial seizure ([place]); Migraines; Seizures ([place]); and pseudoseizures here to follow-up. Reviewed last  Labs. He has increasing amnestic spells.   Need to follow up  for sleep issues- needs compliance visit once a year, can see NP.  This will not affect his seizure clinic visit with Dr Krista Blue.    [facility] [address] [place], [place] [address] [phone]

## 2015-11-01 ENCOUNTER — Ambulatory Visit (INDEPENDENT_AMBULATORY_CARE_PROVIDER_SITE_OTHER): Payer: 59

## 2015-11-01 DIAGNOSIS — R413 Other amnesia: Secondary | ICD-10-CM | POA: Diagnosis not present

## 2015-11-01 DIAGNOSIS — G40209 Localization-related (focal) (partial) symptomatic epilepsy and epileptic syndromes with complex partial seizures, not intractable, without status epilepticus: Secondary | ICD-10-CM

## 2015-11-01 MED ORDER — GADOPENTETATE DIMEGLUMINE 469.01 MG/ML IV SOLN: 20.0000 mL | Freq: Once | INTRAVENOUS | Status: AC | PRN

## 2015-11-03 ENCOUNTER — Telehealth: Payer: Self-pay | Admitting: *Deleted

## 2015-11-03 NOTE — Telephone Encounter (Signed)
I spoke to pt and relayed message below.  He verbalized understanding.

## 2015-11-03 NOTE — Telephone Encounter (Signed)
-----   Message from Dennie Bible, NP sent at 11/03/2015 11:14 AM EDT ----- Please let patient know MRI of the brain with no acute findings and no changes when compared to last  On   08/2013

## 2015-11-07 ENCOUNTER — Encounter: Payer: Self-pay | Admitting: *Deleted

## 2015-11-14 ENCOUNTER — Ambulatory Visit (INDEPENDENT_AMBULATORY_CARE_PROVIDER_SITE_OTHER): Payer: 59 | Admitting: Family Medicine

## 2015-11-14 VITALS — BP 114/68 | HR 68 | Temp 98.0°F | Resp 16 | Ht 71.0 in | Wt 228.0 lb

## 2015-11-14 DIAGNOSIS — M545 Low back pain, unspecified: Secondary | ICD-10-CM

## 2015-11-14 DIAGNOSIS — E7211 Homocystinuria: Secondary | ICD-10-CM

## 2015-11-14 DIAGNOSIS — E876 Hypokalemia: Secondary | ICD-10-CM | POA: Diagnosis not present

## 2015-11-14 DIAGNOSIS — J309 Allergic rhinitis, unspecified: Secondary | ICD-10-CM | POA: Diagnosis not present

## 2015-11-14 DIAGNOSIS — E119 Type 2 diabetes mellitus without complications: Secondary | ICD-10-CM

## 2015-11-14 DIAGNOSIS — R7989 Other specified abnormal findings of blood chemistry: Secondary | ICD-10-CM

## 2015-11-14 LAB — POCT URINALYSIS DIP (MANUAL ENTRY)
Glucose, UA: NEGATIVE
Ketones, POC UA: NEGATIVE
Leukocytes, UA: NEGATIVE
Nitrite, UA: NEGATIVE
Protein Ur, POC: 30 — AB
Spec Grav, UA: 1.025
Urobilinogen, UA: 4
pH, UA: 6

## 2015-11-14 LAB — POCT GLYCOSYLATED HEMOGLOBIN (HGB A1C): Hemoglobin A1C: 5.4

## 2015-11-14 LAB — GLUCOSE, POCT (MANUAL RESULT ENTRY): POC Glucose: 169 mg/dl — AB (ref 70–99)

## 2015-11-14 MED ORDER — POTASSIUM CHLORIDE CRYS ER 20 MEQ PO TBCR: 20.0000 meq | EXTENDED_RELEASE_TABLET | Freq: Two times a day (BID) | ORAL | Status: DC

## 2015-11-14 MED ORDER — METFORMIN HCL 500 MG PO TABS: ORAL_TABLET | ORAL | Status: DC

## 2015-11-14 MED ORDER — FOLIC ACID 1 MG PO TABS: 2.0000 mg | ORAL_TABLET | Freq: Every day | ORAL | Status: DC

## 2015-11-14 MED ORDER — FLUTICASONE PROPIONATE 50 MCG/ACT NA SUSP: 2.0000 | Freq: Every day | NASAL | Status: DC

## 2015-11-14 NOTE — Patient Instructions (Addendum)
  55 yo man with complex partial seizures who also has memory lapses.  His overall condition has not changed much.  He does have some right low back pain in the lumbar area with no known etiology.  It started 3 weeks ago.  Massage has not helped.  No recent change in medication.  He did have an MRI which showed some atrophy.  Blood sugars seem well-controlled.   Sometimes the sugar is actually low and he has to drink OJ when the sweat suggests low levels of glucose.  Objective:  BP 114/68 mmHg  Pulse 68  Temp(Src) 98 F (36.7 C) (Oral)  Resp 16  Ht 5' 11"  (1.803 m)  Wt 228 lb (103.42 kg)  BMI 31.81 kg/m2  SpO2 98% Foot exam completed:  Negative SLR: negative Normal back contour with no significant CVAT Skin: no rash Mental status:  Very appropriate with excellent vocabulary.  Results for orders placed or performed in visit on 11/14/15  POCT glycosylated hemoglobin (Hb A1C)  Result Value Ref Range   Hemoglobin A1C 5.4   POCT glucose (manual entry)  Result Value Ref Range   POC Glucose 169 (A) 70 - 99 mg/dl  POCT urinalysis dipstick  Result Value Ref Range   Color, UA brown (A) yellow   Clarity, UA clear clear   Glucose, UA negative negative   Bilirubin, UA small (A) negative   Ketones, POC UA negative negative   Spec Grav, UA 1.025    Blood, UA large (A) negative   pH, UA 6.0    Protein Ur, POC =30 (A) negative   Urobilinogen, UA 4.0    Nitrite, UA Negative Negative   Leukocytes, UA Negative Negative   Assessment:  Patient had urological evaluation 6 months ago which was negative.  He has several kidney stones on the left. The diabetes is well controlled. The mental status changes warrant continued disability status.  Plan:  Reduce the metformin to one daily. Recheck labs in 3 months.  [name], MD

## 2015-11-14 NOTE — Progress Notes (Signed)
55 yo man with complex partial seizures who also has memory lapses.  His overall condition has not changed much.  He does have some right low back pain in the lumbar area with no known etiology.  It started 3 weeks ago.  Massage has not helped.  No recent change in medication.  He did have an MRI which showed some atrophy.  Blood sugars seem well-controlled.   Sometimes the sugar is actually low and he has to drink OJ when the sweat suggests low levels of glucose.  Objective:  BP 114/68 mmHg  Pulse 68  Temp(Src) 98 F (36.7 C) (Oral)  Resp 16  Ht 5' 11"  (1.803 m)  Wt 228 lb (103.42 kg)  BMI 31.81 kg/m2  SpO2 98% Foot exam completed:  Negative SLR: negative Normal back contour with no significant CVAT Skin: no rash Mental status:  Very appropriate with excellent vocabulary.  Results for orders placed or performed in visit on 11/14/15  POCT glycosylated hemoglobin (Hb A1C)  Result Value Ref Range   Hemoglobin A1C 5.4   POCT glucose (manual entry)  Result Value Ref Range   POC Glucose 169 (A) 70 - 99 mg/dl  POCT urinalysis dipstick  Result Value Ref Range   Color, UA brown (A) yellow   Clarity, UA clear clear   Glucose, UA negative negative   Bilirubin, UA small (A) negative   Ketones, POC UA negative negative   Spec Grav, UA 1.025    Blood, UA large (A) negative   pH, UA 6.0    Protein Ur, POC =30 (A) negative   Urobilinogen, UA 4.0    Nitrite, UA Negative Negative   Leukocytes, UA Negative Negative   Assessment:  Patient had urological evaluation 6 months ago which was negative.  He has several kidney stones on the left. The diabetes is well controlled. The mental status changes warrant continued disability status.  Plan:  Reduce the metformin to one daily. Recheck labs in 3 months. Controlled type 2 diabetes mellitus without complication, without long-term current use of insulin ([place]) - Plan: POCT glycosylated hemoglobin (Hb A1C), POCT glucose (manual entry),  Microalbumin, urine, metFORMIN (GLUCOPHAGE) 500 MG tablet  Right-sided low back pain without sciatica - Plan: POCT urinalysis dipstick  Elevated homocysteine (HCC) - Plan: folic acid (FOLVITE) 1 MG tablet  Allergic rhinitis, unspecified allergic rhinitis type - Plan: fluticasone (FLONASE) 50 MCG/ACT nasal spray  Hypokalemia - Plan: potassium chloride SA (K-DUR,KLOR-CON) 20 MEQ tablet    [name], MD

## 2015-11-15 LAB — MICROALBUMIN, URINE: Microalb, Ur: 3.8 mg/dL

## 2015-11-22 ENCOUNTER — Telehealth: Payer: Self-pay

## 2015-11-22 NOTE — Telephone Encounter (Signed)
Patient called again stating that [facility] and [facility] to [facility] by mistake.   Best number to contact patient is [phone]

## 2015-11-22 NOTE — Telephone Encounter (Signed)
Pts meds were sent to the wrong pharmacy, it will need [facility] for [facility] purposes.

## 2015-11-24 NOTE — Telephone Encounter (Signed)
I advise patient that [facility] have [facility] and ask if there were other medications that needed to be sent. Per pt he is unsure but will go home and give [place] a call back tomorrow if there are medications that need to be fix.

## 2015-11-29 ENCOUNTER — Telehealth: Payer: Self-pay | Admitting: Emergency Medicine

## 2015-11-29 MED ORDER — DILTIAZEM HCL ER BEADS 180 MG PO CP24: 180.0000 mg | ORAL_CAPSULE | Freq: Every day | ORAL | Status: DC

## 2015-11-29 NOTE — Telephone Encounter (Signed)
Pt called in for medication refill Cardizem Medication [facility]

## 2015-12-26 ENCOUNTER — Other Ambulatory Visit: Payer: Self-pay | Admitting: Gastroenterology

## 2015-12-27 ENCOUNTER — Other Ambulatory Visit: Payer: Self-pay | Admitting: *Deleted

## 2015-12-27 MED ORDER — LACOSAMIDE 100 MG PO TABS: 100.0000 mg | ORAL_TABLET | Freq: Two times a day (BID) | ORAL | Status: DC

## 2016-01-15 ENCOUNTER — Telehealth: Payer: Self-pay | Admitting: Gastroenterology

## 2016-01-15 MED ORDER — MERCAPTOPURINE 50 MG PO TABS: 100.0000 mg | ORAL_TABLET | Freq: Every day | ORAL | 0 refills | Status: DC

## 2016-01-15 MED ORDER — MESALAMINE 1.2 G PO TBEC: 2.4000 g | DELAYED_RELEASE_TABLET | Freq: Every day | ORAL | 0 refills | Status: DC

## 2016-01-15 NOTE — Telephone Encounter (Signed)
Spoke with patient and he states he needs his prescriptions Lialda and mercaptopurine sent to [facility]. He states he may have enough so he does not need to the temporary supply sent to local pharmacy. [facility] for one 90 day supply with no refills until scheduled appt.

## 2016-01-15 NOTE — Telephone Encounter (Signed)
Left a message for patient to return my call. 

## 2016-01-18 ENCOUNTER — Telehealth: Payer: Self-pay | Admitting: Gastroenterology

## 2016-01-18 MED ORDER — METHSCOPOLAMINE BROMIDE 5 MG PO TABS: 5.0000 mg | ORAL_TABLET | Freq: Two times a day (BID) | ORAL | 0 refills | Status: DC

## 2016-01-18 NOTE — Telephone Encounter (Signed)
Prescription has been sent to patient's mail order pharmacy.

## 2016-01-30 ENCOUNTER — Telehealth: Payer: Self-pay

## 2016-01-30 NOTE — Telephone Encounter (Signed)
Pt is needing a letter for jury duty to excuse him for his duties  Best number [phone]

## 2016-02-01 NOTE — Telephone Encounter (Signed)
Spoke with pt, he has a seizure disorder to where he experiences memory loss. Can we write?

## 2016-02-02 NOTE — Telephone Encounter (Signed)
That is fine.  Please write to sign for a provider.

## 2016-02-05 NOTE — Telephone Encounter (Signed)
Letter pended, please sign and place in the nurse's box.

## 2016-02-06 ENCOUNTER — Telehealth: Payer: Self-pay | Admitting: Emergency Medicine

## 2016-02-06 ENCOUNTER — Telehealth: Payer: Self-pay | Admitting: Family Medicine

## 2016-02-06 NOTE — Telephone Encounter (Signed)
This was completed by lauenstein evidently

## 2016-02-06 NOTE — Telephone Encounter (Signed)
Patient needs a note for jury duty

## 2016-02-13 DIAGNOSIS — Z0271 Encounter for disability determination: Secondary | ICD-10-CM

## 2016-02-26 ENCOUNTER — Other Ambulatory Visit: Payer: Self-pay | Admitting: Family Medicine

## 2016-03-15 ENCOUNTER — Ambulatory Visit: Payer: 59 | Admitting: Gastroenterology

## 2016-03-20 ENCOUNTER — Telehealth: Payer: Self-pay | Admitting: Gastroenterology

## 2016-03-22 ENCOUNTER — Other Ambulatory Visit: Payer: Self-pay | Admitting: Family Medicine

## 2016-03-25 NOTE — Telephone Encounter (Signed)
/  23/17 last ov and lab

## 2016-03-25 NOTE — Telephone Encounter (Signed)
Meds ordered this encounter  Medications  . esomeprazole (NEXIUM) 40 MG capsule    Sig: TAKE 1 CAPSULE TWICE A DAY    Dispense:  180 capsule    Refill:  0  . escitalopram (LEXAPRO) 10 MG tablet    Sig: TAKE 1 TABLET DAILY    Dispense:  90 tablet    Refill:  0    Please remind the patient that he is due for follow up with fasting labs, and he needs to establish with a new PCP since Dr. Joseph Art has retired.

## 2016-03-27 NOTE — Telephone Encounter (Signed)
Left message stating rx called in and needs fasting lab and appt. With new pcp.

## 2016-03-28 MED ORDER — METHSCOPOLAMINE BROMIDE 5 MG PO TABS: 5.0000 mg | ORAL_TABLET | Freq: Two times a day (BID) | ORAL | 0 refills | Status: DC

## 2016-03-28 MED ORDER — ESOMEPRAZOLE MAGNESIUM 40 MG PO CPDR: 40.0000 mg | DELAYED_RELEASE_CAPSULE | Freq: Two times a day (BID) | ORAL | 0 refills | Status: DC

## 2016-03-28 MED ORDER — MESALAMINE 1.2 G PO TBEC: 2.4000 g | DELAYED_RELEASE_TABLET | Freq: Every day | ORAL | 0 refills | Status: DC

## 2016-03-28 MED ORDER — MERCAPTOPURINE 50 MG PO TABS: 100.0000 mg | ORAL_TABLET | Freq: Every day | ORAL | 0 refills | Status: DC

## 2016-03-28 NOTE — Telephone Encounter (Signed)
Informed patient that I sent those prescriptions to [facility] and that he needs to keep his follow up with Dr. Fuller Plan. Patient verbalized understanding.

## 2016-03-28 NOTE — Telephone Encounter (Signed)
Left a message for patient to return my call. Sent prescriptions to [facility].

## 2016-04-24 ENCOUNTER — Ambulatory Visit: Payer: 59 | Admitting: Nurse Practitioner

## 2016-04-30 ENCOUNTER — Ambulatory Visit (INDEPENDENT_AMBULATORY_CARE_PROVIDER_SITE_OTHER): Payer: 59 | Admitting: Nurse Practitioner

## 2016-04-30 ENCOUNTER — Encounter: Payer: Self-pay | Admitting: Nurse Practitioner

## 2016-04-30 VITALS — BP 119/71 | HR 70 | Ht 71.0 in | Wt 232.8 lb

## 2016-04-30 DIAGNOSIS — G40209 Localization-related (focal) (partial) symptomatic epilepsy and epileptic syndromes with complex partial seizures, not intractable, without status epilepticus: Secondary | ICD-10-CM

## 2016-04-30 DIAGNOSIS — R413 Other amnesia: Secondary | ICD-10-CM | POA: Diagnosis not present

## 2016-04-30 DIAGNOSIS — F329 Major depressive disorder, single episode, unspecified: Secondary | ICD-10-CM

## 2016-04-30 DIAGNOSIS — F32A Depression, unspecified: Secondary | ICD-10-CM | POA: Insufficient documentation

## 2016-04-30 MED ORDER — ESCITALOPRAM OXALATE 10 MG PO TABS: 10.0000 mg | ORAL_TABLET | Freq: Every day | ORAL | 1 refills | Status: DC

## 2016-04-30 MED ORDER — LACOSAMIDE 150 MG PO TABS: 150.0000 mg | ORAL_TABLET | Freq: Two times a day (BID) | ORAL | 1 refills | Status: DC

## 2016-04-30 NOTE — Patient Instructions (Addendum)
Increase Vimpat 150 mg twice daily After on this dose for 1 week keep a record of your events Continue seeing a counselor Continue Lexapro at current dose Follow-up in 6 months

## 2016-04-30 NOTE — Progress Notes (Signed)
[facility]  PATIENT: [name] [name] DOB: 08-10-1960   REASON FOR VISIT: follow up for seizure disorder,  nonepileptic episodes memory HISTORY FROM: Patient and friend    HISTORY OF PRESENT ILLNESS: HISTORY:YY [name] [name] is a 55 years old right-handed male, accompanied by his wife, was seen in refer by his primary care physician Dr. Robyn [name] for evaluation of seizure He had a past medical history of diabetes, hypertension, Crohn's disease, on immunosuppressive treatment, He presented with seizure-like event, reported initial event was June 28 2014, he was found by his wife to difficulty to be awake and eyes closed, took a while for him to open his eyes, and become very confused, also angry, episodes last about 5-10 minutes, there was also witnessed leg extension, jerking movement, He had multiple recurrent confusion episodes later, MRI of the brain, MRA of the brain in April 2013 was normal, EEG showed intermittent left anterior and medial temporal lobe slowing He later was also diagnosed with severe obstructive sleep apnea, using CPAP machine UPDATE August 1 st 2016:YYHe continue have recurrent episode while taking Vimpat 200 mg twice a day, and titrating dose of Keppra,  I have reviewed and summarized his video EEG monitoring from [facility] in June 17 to December 14 2014, patient reported different spells, 1, memory/sleepiness, preceded by headaches, often followed by right arm numbness, 2, staring and oral automatism, Video EEG monitoring was carried out during withdraw of the admission antiseizure medications, as well as sleep deprivation, for typical example of type I spells were recorded, with overall findings suggesting the diagnosis of psychogenic nonepileptic spells, is referred to psychotherapy, Keppra was tapered off,  Interictal EEG was normal, non-of the type II spells were recorded, clinical impression is that spell type II is likely to be focal  seizure, he was advised to keep Vimpat, decreased the dosage from 200 mg twice a day to 100 mg twice a day, He is also taking Lexapro 10 mg daily, which has helped his depression anxiety, UPDATE 04/25/2015 CMMr. [name], 55 year old male returns for follow-up. He has a history of obstructive sleep apnea with CPAP, focal seizures, and pseudoseizures. He is currently seeing a counselor [name] [name] once a week and he has about 1 pseudoseizure per week. He is not seen a psychiatrist. He has felt counseling effective. He is now walking for exercise. He remains on Vimpat 100 twice daily without side effects. He is also on Lexapro. He returns for reevaluation UPDATE 10/23/2015 [name], 55 year old male returns for follow-up. He was last seen in the office 04/25/2015. Since that time the wife has noted several episodes of lip smacking which occurred in sleep. He continues to be compliant with CPAP however has not had a follow-up in 2 years with Dr. Brett [name]. He also complains of worsening memory today last MRI of the brain in 2015. He has problems remembering names or identifying objects. MMSE today 28 out of 30. He continues to see a counselor every other week. He is on Lexapro for depression. Also has a history of diabetes and most recent hemoglobin A1c 5.2. He returns for reevaluation UPDATE 11/07/2017[name], 55 year old male returns for follow-up. In the last month he reports one to 2 times a week lip smacking and chewing with confusion afterwards. He has also had one fall with these episodes. He is currently on Vimpat 100 mg twice daily. He has been diagnosed with depression and is currently seeing a Social worker. He is on Lexapro. He also has  a history of diabetes but claims his blood sugars are never over 140. He claims he is compliant with his CPAP for obstructive sleep apnea. He returns for reevaluation     REVIEW OF SYSTEMS: Full 14 system review of systems performed and notable only for those  listed, all others are neg:  Constitutional: neg  Cardiovascular: neg Ear/Nose/Throat: Hearing loss Skin: neg Eyes:Light sensitivityatory: neg Gastroitestinal: Abdominal pain Hematology /Lymphatic: neg  Endocrine: neg Musculoskeletal:neg Allergy/Immunology: neg Neurology Memory loss, seizure, headache Psychiatric Anxiety depression Sleep obstructive sleep apnea on CPAP  ALLERGIES: Allergies  Allergen Reactions  . Azithromycin Swelling  . Dilaudid [Hydromorphone Hcl] Nausea And Vomiting  . Penicillins Hives  . Sulfonamide Derivatives Hives    HOME MEDICATIONS: Outpatient Medications Prior to Visit  Medication Sig Dispense Refill  . aspirin 81 MG tablet Take 81 mg by mouth daily.    Marland Kitchen CALCIUM PO Take 1 tablet by mouth 3 (three) times daily.     . Cyanocobalamin (VITAMIN B 12 PO) Take 1 tablet by mouth every morning.     . diltiazem (TIAZAC) 180 MG 24 hr capsule Take 1 capsule (180 mg total) by mouth daily. 90 capsule 3  . eletriptan (RELPAX) 40 MG tablet Take 1 tablet at onset of headache, may repeat in 2 hours if headache persists or reoccurs. 12 tablet 4  . escitalopram (LEXAPRO) 10 MG tablet TAKE 1 TABLET DAILY 90 tablet 0  . esomeprazole (NEXIUM) 40 MG capsule Take 1 capsule (40 mg total) by mouth 2 (two) times daily. 180 capsule 0  . Ferrous Sulfate (IRON SUPPLEMENT PO) Take by mouth daily. 315m    . fluticasone (FLONASE) 50 MCG/ACT nasal spray Place 2 sprays into both nostrils daily. 16 g 11  . folic acid (FOLVITE) 1 MG tablet Take 2 tablets (2 mg total) by mouth daily. 180 tablet 3  . glucose blood (CHOICE DM FORA G20 TEST STRIPS) test strip Use as instructed 100 each 12  . Lacosamide (VIMPAT) 100 MG TABS Take 1 tablet (100 mg total) by mouth 2 (two) times daily. 180 tablet 1  . Lactase (DAIRY-RELIEF PO) Take 2 tablets by mouth 3 (three) times daily.     .Marland Kitchenloratadine (CLARITIN) 10 MG tablet Take 10 mg by mouth daily.    . mercaptopurine (PURINETHOL) 50 MG tablet Take 2  tablets (100 mg total) by mouth daily. Give on an empty stomach 1 hour before or 2 hours after meals. Caution: Chemotherapy. 180 tablet 0  . mesalamine (LIALDA) 1.2 g EC tablet Take 2 tablets (2.4 g total) by mouth daily. 180 tablet 0  . metFORMIN (GLUCOPHAGE) 500 MG tablet TAKE 1 TABLET EVERY EVENING (Patient taking differently: Take 1,000 mg by mouth. TAKE 1 TABLET EVERY EVENING) 90 tablet 3  . methscopolamine (PAMINE FORTE) 5 MG tablet Take 1 tablet (5 mg total) by mouth 2 (two) times daily. (Patient taking differently: Take 2.5 mg by mouth 2 (two) times daily. ) 180 tablet 0  . metoprolol tartrate (LOPRESSOR) 25 MG tablet TAKE ONE-HALF (1/2) TABLET TWICE A DAY 90 tablet 0  . ONE TOUCH ULTRA TEST test strip USE AS INSTRUCTED 100 each 2  . ONETOUCH DELICA LANCETS 354UMISC USE AS INSTRUCTED (NEEDS OFFICE VISIT) 100 each 0  . potassium chloride SA (K-DUR,KLOR-CON) 20 MEQ tablet Take 1 tablet (20 mEq total) by mouth 2 (two) times daily. 180 tablet 2  . saccharomyces boulardii (FLORASTOR) 250 MG capsule Take 250 mg by mouth daily.     .Marland Kitchen  ALPRAZolam (XANAX) 0.5 MG tablet Take 1-2 tablets 30 minutes prior to MRI.  May take one additional tablet immediately prior to MRI, if needed.  Must have driver. (Patient not taking: Reported on 04/30/2016) 3 tablet 0  . tamsulosin (FLOMAX) 0.4 MG CAPS capsule Take 1 capsule (0.4 mg total) by mouth as needed. (Patient not taking: Reported on 04/30/2016) 30 capsule 11   Facility-Administered Medications Prior to Visit  Medication Dose Route Frequency Provider Last Rate Last Dose  . gadopentetate dimeglumine (MAGNEVIST) injection 20 mL  20 mL Intravenous Once PRN Marcial Pacas, MD        PAST MEDICAL HISTORY: Past Medical History:  Diagnosis Date  . Adenomatous colon polyp 10/1999  . Cancer ([place])    skin  . Cataract   . Complex partial seizure ([place])   . Crohn's disease of small and large intestines ([place]) 1999  . Diabetes mellitus   . Esophageal stricture   .  External hemorrhoids   . GERD (gastroesophageal reflux disease)   . History of nuclear stress test 11/15/2010   exercise myoview; normal pattern of perfusion; normal, low risk study   . Hyperlipemia   . Hypertension   . Kidney stones   . Migraines   . MRSA (methicillin resistant Staphylococcus aureus) 10/2010   . Osteoporosis   . Pneumonia   . PONV (postoperative nausea and vomiting)   . Seizures ([place])   . Skin cancer   . Sleep apnea    CPAP machine    PAST SURGICAL HISTORY: Past Surgical History:  Procedure Laterality Date  . CATARACT EXTRACTION Bilateral   . ELBOW SURGERY  2012   elbow MRSA infection   . EYE SURGERY    . SHOULDER SURGERY    . SINUS SURGERY WITH INSTATRAK    . TRANSTHORACIC ECHOCARDIOGRAM  02/22/2011   EF 55-65%; increased pattern of LVH with mild conc hypertrophy, abnormal relaxation & increased filling pressure (grade 2 diastolic dysfunction); atrial septum thickened (lipomatous hypertrophy)  . VASECTOMY      FAMILY HISTORY: Family History  Problem Relation Age of Onset  . Colon polyps Father   . Heart disease Father     HTN, aneursym  . Stroke Father   . Hyperlipidemia Father   . Hypertension Father   . Heart disease Mother     CABG at age [name]  . Stroke Mother   . Hyperlipidemia Mother   . Hypertension Mother   . Colon cancer Neg Hx   . Stroke Paternal Grandmother   . Stroke Paternal Grandfather   . Other Child     tetrology of fallot (cornealia deland syndrome)    SOCIAL HISTORY: Social History   Social History  . Marital status: Married    Spouse name: [name]  . Number of children: 2  . Years of education: college   Occupational History  . disabled    Social History Main Topics  . Smoking status: Never Smoker  . Smokeless tobacco: Never Used  . Alcohol use No  . Drug use: No  . Sexual activity: Yes    Birth control/ protection: Surgical     Comment: 1 partner in last 12 months   Other Topics Concern  . Not on file    Social History Narrative   Patient is married [name]) and lives at home with his family.   Patient has two children.   Patient has a college education.   Patient is right-handed.   Patient drinks one cup of soda daily.  PHYSICAL EXAM  Vitals:   04/30/16 1433  BP: 119/71  Pulse: 70  Weight: 232 lb 12.8 oz (105.6 kg)  Height: 5' 11"  (1.803 m)   Body mass index is 32.47 kg/m. Generalized: Well developed, obese male in no acute distress  Head: normocephalic and atraumatic,. Oropharynx benign  Neck: Supple, no carotid bruits  Cardiac: Regular rate rhythm, no murmur  Musculoskeletal: No deformity   Neurological examination   Mentation: Alert oriented to time, place, history taking. Attention span and concentration appropriate. MMSE 26/30. .  Follows all commands speech and language fluent.   Cranial nerve II-XII: Fundoscopic exam reveals sharp disc margins.Pupils were equal round reactive to light extraocular movements were full, visual field were full on confrontational test. Facial sensation and strength were normal. hearing was intact to finger rubbing bilaterally. Uvula tongue midline. head turning and shoulder shrug were normal and symmetric.Tongue protrusion into cheek strength was normal. Motor: normal bulk and tone, full strength in the BUE, BLE, fine finger movements normal, no pronator drift. No focal weakness Sensory: Mildly length dependent decreased light touch pinprick and vibratory at toes  Coordination: finger-nose-finger, heel-to-shin bilaterally, no dysmetria Reflexes: Brachioradialis 2/2, biceps 2/2, triceps 2/2, patellar 2/2, Achilles 2/2, plantar responses were flexor bilaterally. Gait and Station: Rising up from seated position without assistance, normal stance, moderate stride, good arm swing, smooth turning, able to perform tiptoe, and heel walking without difficulty. Tandem gait is mildly unsteady. No assistive device  DIAGNOSTIC DATA (LABS,  IMAGING, TESTING) - I reviewed patient records, labs, notes, testing and imaging myself where available.  Lab Results  Component Value Date   WBC 7.2 02/16/2015   HGB 13.6 (A) 02/16/2015   HCT 41.3 (A) 02/16/2015   MCV 108.9 (A) 02/16/2015   PLT 157.0 01/09/2015      Component Value Date/Time   NA 143 10/23/2015 0932   K 4.7 10/23/2015 0932   CL 104 10/23/2015 0932   CO2 22 10/23/2015 0932   GLUCOSE 137 (H) 10/23/2015 0932   GLUCOSE 147 (H) 06/03/2015 0858   BUN 14 10/23/2015 0932   CREATININE 0.97 10/23/2015 0932   CREATININE 0.85 06/03/2015 0858   CALCIUM 8.9 10/23/2015 0932   PROT 7.0 06/03/2015 0858   ALBUMIN 4.1 06/03/2015 0858   AST 31 06/03/2015 0858   ALT 28 06/03/2015 0858   ALKPHOS 100 06/03/2015 0858   BILITOT 2.3 (H) 06/03/2015 0858   GFRNONAA 89 10/23/2015 0932   GFRNONAA >89 06/03/2015 0858   GFRAA 103 10/23/2015 0932   GFRAA >89 06/03/2015 0858    Lab Results  Component Value Date   HGBA1C 5.4 11/14/2015   Lab Results  Component Value Date   VITAMINB12 >1500 (H) 01/10/2015       ASSESSMENT AND PLAN [name] y.o. year old male has a past medical history of Hypertension; Diabetes mellitus; Hyperlipemia; Sleep apnea; Complex partial seizure ([place]); Migraines; Seizures ([place]); and pseudoseizures here to follow-up.he has had more issues of lip smacking and chewing one to 2 times a week over the last several weeks . MRI of the brain 11/01/15 with no acute findings and no changes when compared to previous in 2015   Increase Vimpat 150 mg twice daily After on this dose for 1 week keep a record of your events Continue seeing a counselor Continue Lexapro at current dose will refill F/U in 6 months with me Vst time 30 min Dennie Bible, [facility], [facility], APRN  [facility] [address] [place], [place]  [address] [phone]  

## 2016-05-01 NOTE — Progress Notes (Signed)
Fax confirmation received vimpat [phone]. ssy

## 2016-05-02 NOTE — Progress Notes (Signed)
I have reviewed and agreed above plan. 

## 2016-05-07 ENCOUNTER — Ambulatory Visit (INDEPENDENT_AMBULATORY_CARE_PROVIDER_SITE_OTHER): Payer: 59 | Admitting: Gastroenterology

## 2016-05-07 ENCOUNTER — Other Ambulatory Visit (INDEPENDENT_AMBULATORY_CARE_PROVIDER_SITE_OTHER): Payer: 59

## 2016-05-07 ENCOUNTER — Encounter: Payer: Self-pay | Admitting: Gastroenterology

## 2016-05-07 VITALS — BP 116/64 | HR 68 | Ht 70.25 in | Wt 231.1 lb

## 2016-05-07 DIAGNOSIS — K429 Umbilical hernia without obstruction or gangrene: Secondary | ICD-10-CM | POA: Diagnosis not present

## 2016-05-07 DIAGNOSIS — R197 Diarrhea, unspecified: Secondary | ICD-10-CM | POA: Diagnosis not present

## 2016-05-07 DIAGNOSIS — K50811 Crohn's disease of both small and large intestine with rectal bleeding: Secondary | ICD-10-CM

## 2016-05-07 DIAGNOSIS — K921 Melena: Secondary | ICD-10-CM | POA: Diagnosis not present

## 2016-05-07 LAB — COMPREHENSIVE METABOLIC PANEL
ALT: 48 U/L (ref 0–53)
AST: 54 U/L — ABNORMAL HIGH (ref 0–37)
Albumin: 4.1 g/dL (ref 3.5–5.2)
Alkaline Phosphatase: 130 U/L — ABNORMAL HIGH (ref 39–117)
BILIRUBIN TOTAL: 2.3 mg/dL — AB (ref 0.2–1.2)
BUN: 13 mg/dL (ref 6–23)
CO2: 21 meq/L (ref 19–32)
Calcium: 9.4 mg/dL (ref 8.4–10.5)
Chloride: 105 mEq/L (ref 96–112)
Creatinine, Ser: 0.9 mg/dL (ref 0.40–1.50)
GFR: 93.32 mL/min (ref >60.00)
GLUCOSE: 185 mg/dL — AB (ref 70–99)
Potassium: 4 mEq/L (ref 3.5–5.1)
Sodium: 136 mEq/L (ref 135–145)
Total Protein: 7.2 g/dL (ref 6.0–8.3)

## 2016-05-07 LAB — CBC WITH DIFFERENTIAL/PLATELET
BASOS ABS: 0 10*3/uL (ref 0.0–0.1)
Basophils Relative: 0.3 % (ref 0.0–3.0)
EOS ABS: 0.3 10*3/uL (ref 0.0–0.7)
Eosinophils Relative: 3.9 % (ref 0.0–5.0)
HCT: 38.5 % — ABNORMAL LOW (ref 39.0–52.0)
Hemoglobin: 13.4 g/dL (ref 13.0–17.0)
LYMPHS ABS: 1.4 10*3/uL (ref 0.7–4.0)
Lymphocytes Relative: 19 % (ref 12.0–46.0)
MCHC: 34.7 g/dL (ref 30.0–36.0)
MCV: 107.3 fl — AB (ref 78.0–100.0)
MONO ABS: 0.7 10*3/uL (ref 0.1–1.0)
MONOS PCT: 10.3 % (ref 3.0–12.0)
NEUTROS ABS: 4.8 10*3/uL (ref 1.4–7.7)
NEUTROS PCT: 66.5 % (ref 43.0–77.0)
PLATELETS: 164 10*3/uL (ref 150.0–400.0)
RBC: 3.59 Mil/uL — ABNORMAL LOW (ref 4.22–5.81)
RDW: 17 % — ABNORMAL HIGH (ref 11.5–15.5)
WBC: 7.2 10*3/uL (ref 4.0–10.5)

## 2016-05-07 LAB — TSH: TSH: 2.32 u[IU]/mL (ref 0.35–4.50)

## 2016-05-07 LAB — SEDIMENTATION RATE: Sed Rate: 10 mm/hr (ref 0–20)

## 2016-05-07 LAB — C-REACTIVE PROTEIN: CRP: 0.6 mg/dL (ref 0.5–20.0)

## 2016-05-07 NOTE — Patient Instructions (Addendum)
Your physician has requested that you go to the basement for lab work before leaving today.  You can take Imodium three times a day as needed for diarrhea.   You have been scheduled for a CT scan of the abdomen and pelvis at [facility] ([address]---this is in the same building as [facility]).   You are scheduled on 05/13/16 at 2:30pm. You should arrive 15 minutes prior to your appointment time for registration. Please follow the written instructions below on the day of your exam:  WARNING: IF YOU ARE ALLERGIC TO IODINE/X-RAY DYE, PLEASE NOTIFY RADIOLOGY IMMEDIATELY AT [phone]! YOU WILL BE GIVEN A 13 HOUR PREMEDICATION PREP.  1) Do not eat or drink anything after 10:30am (4 hours prior to your test) 2) You have been given 2 bottles of oral contrast to drink. The solution may taste               better if refrigerated, but do NOT add ice or any other liquid to this solution. Shake             well before drinking.    Drink 1 bottle of contrast @ 12:30pm (2 hours prior to your exam)  Drink 1 bottle of contrast @ 1:30pm (1 hour prior to your exam)  You may take any medications as prescribed with a small amount of water except for the following: Metformin, Glucophage, Glucovance, Avandamet, Riomet, Fortamet, Actoplus Met, Janumet, Glumetza or Metaglip. The above medications must be held the day of the exam AND 48 hours after the exam.  The purpose of you drinking the oral contrast is to aid in the visualization of your intestinal tract. The contrast solution may cause some diarrhea. Before your exam is started, you will be given a small amount of fluid to drink. Depending on your individual set of symptoms, you may also receive an intravenous injection of x-ray contrast/dye. Plan on being at [facility] for 30 minutes or longer, depending on the type of exam you are having performed.  This test typically takes 30-45 minutes to complete.  If you have any  questions regarding your exam or if you need to reschedule, you may call the CT department at [phone] between the hours of 8:00 am and 5:00 pm, Monday-Friday.  ________________________________________________________________________  Dennis Bast have been scheduled for an appointment with Dr. Zella Richer at [facility]. Your appointment is on ________ at _________. Please arrive at _______ for registration. Make certain to bring a list of current medications, including any over the counter medications or vitamins. Also bring your co-pay if you have one as well as your insurance cards. [facility] is located at [address]. Should you need to reschedule your appointment, please contact them at [phone].  Thank you for choosing me and [facility].  Pricilla Riffle. Dagoberto Ligas., MD., Marval Regal

## 2016-05-07 NOTE — Progress Notes (Signed)
    History of Present Illness: This is a 55 year old male returning for follow-up of Crohn's ileocolitis. He relates worsening problems with abdominal pain, diarrhea and BR rectal bleeding for the past several weeks. He is having 6-8 stools per day with small amounts of rectal bleeding noted intermittently. He notes generalized abdominal pain and also notes a worsening periumbilical bulge that is painful at times. He has had a 50 pound intentional weight loss over the past year. He denies any recent antibiotic usage, dietary or medication changes. He notes a worsening bulge at his umbilicus over the past several months that has become larger and more uncomfortable. It has been easily reducible.  Colonoscopy and pathology 12/11/2013: 1. Three sessile polyps measuring 6-7 mm in the transverse, descending, and sigmoid colon; polypectomy performed with a cold snare 2. Mild diverticulosis in the sigmoid colon 3. Small internal hemorrhoids  1. Surgical [P], random - BENIGN COLONIC MUCOSA. NO ACTIVE COLITIS, GRANULOMATA OR DYSPLASIA IDENTIFIED. 2. Surgical [P], transverse/descending/sigmoid, polyp (3) - TUBULAR ADENOMA (X2) ADMIXED WITH POLYPOID COLONIC MUCOSAL FRAGMENT. - NO HIGH GRADE DYSPLASIA OR INVASIVE MALIGNANCY IDENTIFIED   Current Medications, Allergies, Past Medical History, Past Surgical History, Family History and Social History were reviewed in [employer] record.  Physical Exam: General: Well developed, well nourished, no acute distress Head: Normocephalic and atraumatic Eyes:  sclerae anicteric, EOMI Ears: Normal auditory acuity Mouth: No deformity or lesions Lungs: Clear throughout to auscultation Heart: Regular rate and rhythm; no murmurs, rubs or bruits Abdomen: Soft, mild to moderate generalized tenderness and non distended. No masses, hepatosplenomegaly noted. Small umbilical hernia, tender, easily reducible. Normal Bowel sounds Musculoskeletal:  Symmetrical with no gross deformities  Pulses:  Normal pulses noted Extremities: No clubbing, cyanosis, edema or deformities noted Neurological: Alert oriented x 4, grossly nonfocal Psychological:  Alert and cooperative. Normal mood and affect  Assessment and Recommendations:  1. Crohn's ileocolitis with suspected flare. Rule out infectious diarrhea. CMP, CBC, ESR, CRP, TSH, GI pathogen panel. Schedule abdominal/pelvic CT. If infectious etiologies are adequately excluded will begin budesonide or prednisone. Consider a trial of discontinuing metformin however this is not likely the cause of his current symptoms. Continue 6-MP and mesalamine at current dosages for now. REV in 6-8 weeks.  2. Umbilical hernia which has become larger and more bothersome over several months. Surgical referral for further management.

## 2016-05-08 ENCOUNTER — Telehealth: Payer: Self-pay | Admitting: Gastroenterology

## 2016-05-08 ENCOUNTER — Other Ambulatory Visit: Payer: Self-pay

## 2016-05-08 DIAGNOSIS — R7989 Other specified abnormal findings of blood chemistry: Secondary | ICD-10-CM

## 2016-05-08 DIAGNOSIS — R945 Abnormal results of liver function studies: Principal | ICD-10-CM

## 2016-05-09 ENCOUNTER — Other Ambulatory Visit: Payer: 59

## 2016-05-09 ENCOUNTER — Telehealth: Payer: Self-pay

## 2016-05-09 DIAGNOSIS — K50811 Crohn's disease of both small and large intestine with rectal bleeding: Secondary | ICD-10-CM

## 2016-05-09 DIAGNOSIS — R197 Diarrhea, unspecified: Secondary | ICD-10-CM

## 2016-05-09 MED ORDER — MERCAPTOPURINE 50 MG PO TABS: 100.0000 mg | ORAL_TABLET | Freq: Every day | ORAL | 1 refills | Status: DC

## 2016-05-09 MED ORDER — MESALAMINE 1.2 G PO TBEC: 2.4000 g | DELAYED_RELEASE_TABLET | Freq: Every day | ORAL | 1 refills | Status: DC

## 2016-05-09 MED ORDER — METHSCOPOLAMINE BROMIDE 5 MG PO TABS: 5.0000 mg | ORAL_TABLET | Freq: Two times a day (BID) | ORAL | 1 refills | Status: DC

## 2016-05-09 MED ORDER — ESOMEPRAZOLE MAGNESIUM 40 MG PO CPDR: 40.0000 mg | DELAYED_RELEASE_CAPSULE | Freq: Two times a day (BID) | ORAL | 1 refills | Status: DC

## 2016-05-09 NOTE — Telephone Encounter (Signed)
Left message for patient to return my call.

## 2016-05-09 NOTE — Telephone Encounter (Signed)
Informed patient that his appt with [facility] is on 05/29/16 at 9:30am. Patient's states they already called him and told him to arrive at 9:00am on 05/29/16 to see Dr. Zella Richer.

## 2016-05-09 NOTE — Telephone Encounter (Signed)
Prescriptions sent to mail order pharmacy and patient notified.

## 2016-05-13 ENCOUNTER — Ambulatory Visit (INDEPENDENT_AMBULATORY_CARE_PROVIDER_SITE_OTHER)
Admission: RE | Admit: 2016-05-13 | Discharge: 2016-05-13 | Disposition: A | Discharge status: Home / Self Care | Payer: 59 | Source: Ambulatory Visit | Attending: Gastroenterology | Admitting: Gastroenterology

## 2016-05-13 DIAGNOSIS — K50811 Crohn's disease of both small and large intestine with rectal bleeding: Secondary | ICD-10-CM

## 2016-05-13 DIAGNOSIS — R197 Diarrhea, unspecified: Secondary | ICD-10-CM

## 2016-05-13 MED ORDER — IOPAMIDOL (ISOVUE-300) INJECTION 61%
100.0000 mL | Freq: Once | INTRAVENOUS | Status: AC | PRN
  Administered 2016-05-13: 100 mL via INTRAVENOUS

## 2016-05-14 ENCOUNTER — Emergency Department (HOSPITAL_BASED_OUTPATIENT_CLINIC_OR_DEPARTMENT_OTHER): Payer: 59

## 2016-05-14 ENCOUNTER — Encounter (HOSPITAL_BASED_OUTPATIENT_CLINIC_OR_DEPARTMENT_OTHER): Payer: Self-pay | Admitting: Emergency Medicine

## 2016-05-14 ENCOUNTER — Emergency Department (HOSPITAL_BASED_OUTPATIENT_CLINIC_OR_DEPARTMENT_OTHER)
Admission: EM | Admit: 2016-05-14 | Discharge: 2016-05-14 | Disposition: A | Discharge status: Home / Self Care | Payer: 59 | Attending: Emergency Medicine | Admitting: Emergency Medicine

## 2016-05-14 DIAGNOSIS — W19XXXA Unspecified fall, initial encounter: Secondary | ICD-10-CM | POA: Diagnosis not present

## 2016-05-14 DIAGNOSIS — I10 Essential (primary) hypertension: Secondary | ICD-10-CM | POA: Insufficient documentation

## 2016-05-14 DIAGNOSIS — Y9301 Activity, walking, marching and hiking: Secondary | ICD-10-CM | POA: Insufficient documentation

## 2016-05-14 DIAGNOSIS — Z7982 Long term (current) use of aspirin: Secondary | ICD-10-CM | POA: Diagnosis not present

## 2016-05-14 DIAGNOSIS — S2232XA Fracture of one rib, left side, initial encounter for closed fracture: Secondary | ICD-10-CM | POA: Diagnosis not present

## 2016-05-14 DIAGNOSIS — Z85820 Personal history of malignant melanoma of skin: Secondary | ICD-10-CM | POA: Insufficient documentation

## 2016-05-14 DIAGNOSIS — Z79899 Other long term (current) drug therapy: Secondary | ICD-10-CM | POA: Diagnosis not present

## 2016-05-14 DIAGNOSIS — Z7984 Long term (current) use of oral hypoglycemic drugs: Secondary | ICD-10-CM | POA: Diagnosis not present

## 2016-05-14 DIAGNOSIS — E119 Type 2 diabetes mellitus without complications: Secondary | ICD-10-CM | POA: Insufficient documentation

## 2016-05-14 DIAGNOSIS — Y999 Unspecified external cause status: Secondary | ICD-10-CM | POA: Diagnosis not present

## 2016-05-14 DIAGNOSIS — Y929 Unspecified place or not applicable: Secondary | ICD-10-CM | POA: Insufficient documentation

## 2016-05-14 DIAGNOSIS — S299XXA Unspecified injury of thorax, initial encounter: Secondary | ICD-10-CM | POA: Diagnosis present

## 2016-05-14 LAB — GASTROINTESTINAL PATHOGEN PANEL PCR

## 2016-05-14 MED ORDER — IBUPROFEN 800 MG PO TABS: 800.0000 mg | ORAL_TABLET | Freq: Three times a day (TID) | ORAL | 0 refills | Status: DC | PRN

## 2016-05-14 MED ORDER — HYDROCODONE-ACETAMINOPHEN 5-325 MG PO TABS: 1.0000 | ORAL_TABLET | Freq: Four times a day (QID) | ORAL | 0 refills | Status: DC | PRN

## 2016-05-14 NOTE — ED Triage Notes (Addendum)
Patient with hx of seizures, states he was walking today and had a seizure, falling onto concrete at @ 0945. Patient reports that he thinks he may have broken his left sided ribs. Patient states it hurts to take a deep breathe. Patient has a hx of osteopenia.

## 2016-05-14 NOTE — ED Provider Notes (Signed)
[facility] DEPT [facility] Provider Note   CSN: [id number] Arrival date & time: 05/14/16  1655   By signing my name below, I, Emmanuella Mensah, attest that this documentation has been prepared under the direction and in the presence of [employer], [employer]. Electronically Signed: Judithann Sauger, ED Scribe. 05/14/16. 6:50 PM.   History   Chief Complaint Chief Complaint  Patient presents with  . rib pain s/p fall    at 10am    HPI Comments: [name] [name] is a 55 y.o. male with a hx of hypertension, DM, osteopenia, and seizures who presents to the Emergency Department for evaluation s/p one episode of seizure that occurred today. He explains that he had a seizure while walking outside when he fell onto concrete with LOC. He denies any head injuries. He states that he is here today because he is experiencing moderate pain to his left lateral ribcage. He notes that the pain is worse with deep breathing. No alleviating factors noted. Pt has not tried any medications PTA. He has an allergy to Azithromycin, Dilaudid, Penicillins, and Sulfonamide derivatives. He denies any fever, chills, tongue biting, nausea, vomiting, bladder incontinence, or any other symptoms.   The history is provided by the patient. No language interpreter was used.    Past Medical History:  Diagnosis Date  . Adenomatous colon polyp 10/1999  . Cancer ([place])    skin  . Cataract   . Complex partial seizure ([place])   . Crohn's disease of small and large intestines ([place]) 1999  . Diabetes mellitus   . Esophageal stricture   . External hemorrhoids   . GERD (gastroesophageal reflux disease)   . History of nuclear stress test 11/15/2010   exercise myoview; normal pattern of perfusion; normal, low risk study   . Hyperlipemia   . Hypertension   . Kidney stones   . Migraines   . MRSA (methicillin resistant Staphylococcus aureus) 10/2010   . Osteoporosis   . Pneumonia   . PONV (postoperative nausea and vomiting)   . Seizures  ([place])   . Skin cancer   . Sleep apnea    CPAP machine    Patient Active Problem List   Diagnosis Date Noted  . Depression 04/30/2016  . Memory loss 04/30/2016  . OSA on CPAP 10/31/2015  . Nonepileptic episode ([place]) 04/25/2015  . Seizure ([place]) 04/04/2013  . Chest pain 03/12/2013  . Abnormal EKG 03/12/2013  . DOE (dyspnea on exertion) 03/12/2013  . Obstructive sleep apnea 10/01/2012  . Complex partial seizure ([place]) 04/19/2012  . DM2 (diabetes mellitus, type 2) ([place]) 09/25/2011  . Altered mental status 09/25/2011  . OTHER DYSPHAGIA 03/27/2009  . TRANSAMINASES, SERUM, ELEVATED 03/27/2009  . Cough 11/10/2008  . GERD 12/07/2007  . COLONIC POLYPS, ADENOMATOUS, HX OF 12/07/2007  . EXTERNAL HEMORRHOIDS 09/11/2007  . CROHN'S DISEASE, LARGE AND SMALL INTESTINES 09/11/2007  . OSTEOPOROSIS 09/11/2007  . HYPERLIPIDEMIA NEC/NOS 02/27/2007  . HYPERTENSION, ESSENTIAL NOS 02/27/2007    Past Surgical History:  Procedure Laterality Date  . CATARACT EXTRACTION Bilateral   . ELBOW SURGERY  2012   elbow MRSA infection   . EYE SURGERY    . SHOULDER SURGERY    . SINUS SURGERY WITH INSTATRAK    . TRANSTHORACIC ECHOCARDIOGRAM  02/22/2011   EF 55-65%; increased pattern of LVH with mild conc hypertrophy, abnormal relaxation & increased filling pressure (grade 2 diastolic dysfunction); atrial septum thickened (lipomatous hypertrophy)  . VASECTOMY         Home Medications  Prior to Admission medications   Medication Sig Start Date End Date Taking? Authorizing Provider  aspirin 81 MG tablet Take 81 mg by mouth daily.   Yes Historical Provider, MD  CALCIUM PO Take 1 tablet by mouth 3 (three) times daily.    Yes Historical Provider, MD  Cyanocobalamin (VITAMIN B 12 PO) Take 1 tablet by mouth every morning.    Yes Historical Provider, MD  diltiazem (TIAZAC) 180 MG 24 hr capsule Take 1 capsule (180 mg total) by mouth daily. 11/29/15  Yes [name], MD  escitalopram (LEXAPRO) 10 MG tablet  Take 1 tablet (10 mg total) by mouth daily. 04/30/16  Yes Dennie Bible, NP  esomeprazole (NEXIUM) 40 MG capsule Take 1 capsule (40 mg total) by mouth 2 (two) times daily. 05/09/16  Yes Ladene Artist, MD  Ferrous Sulfate (IRON SUPPLEMENT PO) Take by mouth daily. 313m   Yes Historical Provider, MD  fluticasone (FLONASE) 50 MCG/ACT nasal spray Place 2 sprays into both nostrils daily. 11/14/15  Yes KRobyn Haber MD  folic acid (FOLVITE) 1 MG tablet Take 2 tablets (2 mg total) by mouth daily. 11/14/15  Yes KRobyn Haber MD  glucose blood (CHOICE DM FORA G20 TEST STRIPS) test strip Use as instructed 11/13/12  Yes KRobyn Haber MD  Lacosamide (VIMPAT) 150 MG TABS Take 1 tablet (150 mg total) by mouth 2 (two) times daily. 04/30/16  Yes NDennie Bible NP  Lactase (DAIRY-RELIEF PO) Take 2 tablets by mouth 3 (three) times daily.    Yes Historical Provider, MD  loratadine (CLARITIN) 10 MG tablet Take 10 mg by mouth daily.   Yes Historical Provider, MD  mercaptopurine (PURINETHOL) 50 MG tablet Take 2 tablets (100 mg total) by mouth daily. Give on an empty stomach 1 hour before or 2 hours after meals. Caution: Chemotherapy. 05/09/16  Yes MLadene Artist MD  mesalamine (LIALDA) 1.2 g EC tablet Take 2 tablets (2.4 g total) by mouth daily. 05/09/16  Yes MLadene Artist MD  metFORMIN (GLUCOPHAGE) 500 MG tablet TAKE 1 TABLET EVERY EVENING Patient taking differently: Take 1,000 mg by mouth. TAKE 1 TABLET EVERY EVENING 11/14/15  Yes KRobyn Haber MD  methscopolamine (PAMINE FORTE) 5 MG tablet Take 1 tablet (5 mg total) by mouth 2 (two) times daily. 05/09/16  Yes MLadene Artist MD  metoprolol tartrate (LOPRESSOR) 25 MG tablet TAKE ONE-HALF (1/2) TABLET TWICE A DAY 02/28/16  Yes KWardell Honour MD  ONE TOUCH ULTRA TEST test strip USE AS INSTRUCTED   Yes SMancel Bale PA-C  [facility] T[facility] RiverDELICA LANCETS 341DMISC USE AS INSTRUCTED (NEEDS OFFICE VISIT) 02/04/13  Yes RAreta HaberDunn, PA-C  potassium chloride  SA (K-DUR,KLOR-CON) 20 MEQ tablet Take 1 tablet (20 mEq total) by mouth 2 (two) times daily. 11/14/15  Yes KRobyn Haber MD  saccharomyces boulardii (FLORASTOR) 250 MG capsule Take 250 mg by mouth daily.    Yes Historical Provider, MD  eletriptan (RELPAX) 40 MG tablet Take 1 tablet at onset of headache, may repeat in 2 hours if headache persists or reoccurs. 01/18/13   JWendie Agreste MD    Family History Family History  Problem Relation Age of Onset  . Colon polyps Father   . Heart disease Father     HTN, aneursym  . Stroke Father   . Hyperlipidemia Father   . Hypertension Father   . Heart disease Mother     CABG at age 54 . Stroke Mother   . Hyperlipidemia  Mother   . Hypertension Mother   . Stroke Paternal Grandmother   . Stroke Paternal Grandfather   . Other Child     tetrology of fallot (cornealia deland syndrome)  . Colon cancer Neg Hx     Social History Social History  Substance Use Topics  . Smoking status: Never Smoker  . Smokeless tobacco: Never Used  . Alcohol use No     Allergies   Azithromycin; Dilaudid [hydromorphone hcl]; Penicillins; and Sulfonamide derivatives   Review of Systems Review of Systems  A complete 10 system review of systems was obtained and all systems are negative except as noted in the HPI and PMH.   Physical Exam Updated Vital Signs BP 118/80 (BP Location: Right Arm)   Pulse 69   Temp 98.7 F (37.1 C) (Oral)   Resp 18   Ht 5' 10"  (1.778 m)   Wt 230 lb (104.3 kg)   SpO2 98%   BMI 33.00 kg/m   Physical Exam  Constitutional: He is oriented to person, place, and time. He appears well-developed and well-nourished.  HENT:  Head: Normocephalic and atraumatic.  Cardiovascular: Normal rate.   Pulmonary/Chest: Effort normal and breath sounds normal.  Mid lateral rib pain (5th through 10th ribs)  Good breath sounds bilaterally   Neurological: He is alert and oriented to person, place, and time.  Skin: Skin is warm and dry.    Psychiatric: He has a normal mood and affect.  Nursing note and vitals reviewed.    ED Treatments / Results  DIAGNOSTIC STUDIES: Oxygen Saturation is 98% on RA, normal by my interpretation.    COORDINATION OF CARE: 6:37 PM- Pt advised of plan for treatment and pt agrees. Pt will receive left chest x-ray for further evaluation.    Labs (all labs ordered are listed, but only abnormal results are displayed) Labs Reviewed - No data to display  EKG  EKG Interpretation None       Radiology Dg Ribs Unilateral W/chest Left  Result Date: 05/14/2016 CLINICAL DATA:  Rib pain after fall.  Seizure. EXAM: LEFT RIBS AND CHEST - 3+ VIEW COMPARISON:  01/04/2013 plain films and CT. FINDINGS: Frontal view chest and four views of left-sided ribs. Midline trachea. Mild cardiomegaly. Mediastinal contours otherwise within normal limits. No pleural effusion or pneumothorax. Right hemidiaphragm elevation in eventration. Mild volume loss and scarring at the left lung base laterally. Radiographic marker projects in the region of the tenth posterior left rib. Minimally displaced fracture of the eleventh anterior left rib is likely acute. IMPRESSION: Minimally displaced fracture of the anterior left eleventh rib, favored to be acute. No pleural fluid or pneumothorax. Cardiomegaly without congestive failure. Electronically Signed   By: Abigail Miyamoto M.D.   On: 05/14/2016 17:29   Ct Abdomen Pelvis W Contrast  Result Date: 05/14/2016 CLINICAL DATA:  Crohn's disease. Worsening abdominal cramping and pain. EXAM: CT ABDOMEN AND PELVIS WITH CONTRAST TECHNIQUE: Multidetector CT imaging of the abdomen and pelvis was performed using the standard protocol following bolus administration of intravenous contrast. CONTRAST:  173m ISOVUE-300 IOPAMIDOL (ISOVUE-300) INJECTION 61% COMPARISON:  05/09/2015 FINDINGS: Lower chest:  Unremarkable. Hepatobiliary: The liver shows diffusely decreased attenuation suggesting steatosis.  There is no evidence for gallstones, gallbladder wall thickening, or pericholecystic fluid. No intrahepatic or extrahepatic biliary dilation. Pancreas: No focal mass lesion. No dilatation of the main duct. No intraparenchymal cyst. No peripancreatic edema. Spleen: No splenomegaly. No focal mass lesion. Adrenals/Urinary Tract: No adrenal nodule or mass. Right kidney unremarkable. 6  x 12 x 10 mm stone identified in an interpolar left renal calyx with some subtle edema in the central sinus fat of the left kidney. A seconds 5 x 10 x 9 mm interpolar left renal stone is identified. A third 5 x 11 x 10 mm stone is identified in the lower pole left kidney. There is no left hydronephrosis. 2.8 cm interpolar left renal cyst is identified. 2.9 cm cyst noted in the upper pole left kidney. No evidence for hydroureter. The urinary bladder appears normal for the degree of distention. Stomach/Bowel: Stomach is nondistended. No gastric wall thickening. No evidence of outlet obstruction. Duodenum is normally positioned as is the ligament of Treitz. No small bowel wall thickening. No small bowel dilatation. The terminal ileum is normal. The appendix is not visualized, but there is no edema or inflammation in the region of the cecum. Diverticular changes are noted in the left colon without evidence of diverticulitis. Vascular/Lymphatic: There is abdominal aortic atherosclerosis without aneurysm. There is no gastrohepatic or hepatoduodenal ligament lymphadenopathy. No intraperitoneal or retroperitoneal lymphadenopathy. No pelvic sidewall lymphadenopathy. Reproductive: The prostate gland and seminal vesicles have normal imaging features. Other: No intraperitoneal free fluid. Musculoskeletal: Small bilateral inguinal hernias contain only fat. Small umbilical hernia contains only fat. Bone windows reveal no worrisome lytic or sclerotic osseous lesions. IMPRESSION: 1. No acute findings in the abdomen or pelvis. 2. Stable appearance of  nonobstructing left renal stones with left renal cysts associated. 3. Hepatic steatosis. 4. Small bilateral inguinal hernias with associated small umbilical hernia, each containing only fat. Electronically Signed   By: Misty Stanley M.D.   On: 05/14/2016 09:06    Procedures Procedures (including critical care time)  Medications Ordered in ED Medications - No data to display   Initial Impression / Assessment and Plan / ED Course  Irena Cords, PA-C has reviewed the triage vital signs and the nursing notes.  Pertinent labs & imaging results that were available during my care of the patient were reviewed by me and considered in my medical decision making (see chart for details).  Clinical Course       Final Clinical Impressions(s) / ED Diagnoses   Final diagnoses:  None    New Prescriptions New Prescriptions   No medications on file   I personally performed the services described in this documentation, which was scribed in my presence. The recorded information has been reviewed and is accurate.   Dalia Heading, PA-C 05/16/16 [place], MD 05/16/16 1007

## 2016-05-14 NOTE — Discharge Instructions (Signed)
Use ice and heat on the ribs.  You do have one broken rib.  Follow-up with your primary care doctor.  Return here as needed

## 2016-05-15 ENCOUNTER — Encounter: Payer: Self-pay | Admitting: Family Medicine

## 2016-05-15 ENCOUNTER — Ambulatory Visit (INDEPENDENT_AMBULATORY_CARE_PROVIDER_SITE_OTHER): Payer: 59 | Admitting: Family Medicine

## 2016-05-15 ENCOUNTER — Other Ambulatory Visit: Payer: Self-pay

## 2016-05-15 VITALS — BP 106/60 | HR 70 | Temp 97.8°F | Resp 16 | Ht 72.0 in | Wt 228.6 lb

## 2016-05-15 DIAGNOSIS — Z0000 Encounter for general adult medical examination without abnormal findings: Secondary | ICD-10-CM

## 2016-05-15 DIAGNOSIS — I10 Essential (primary) hypertension: Secondary | ICD-10-CM | POA: Diagnosis not present

## 2016-05-15 DIAGNOSIS — R413 Other amnesia: Secondary | ICD-10-CM | POA: Diagnosis not present

## 2016-05-15 DIAGNOSIS — E119 Type 2 diabetes mellitus without complications: Secondary | ICD-10-CM

## 2016-05-15 DIAGNOSIS — Z23 Encounter for immunization: Secondary | ICD-10-CM

## 2016-05-15 DIAGNOSIS — K5080 Crohn's disease of both small and large intestine without complications: Secondary | ICD-10-CM | POA: Diagnosis not present

## 2016-05-15 DIAGNOSIS — Z9989 Dependence on other enabling machines and devices: Secondary | ICD-10-CM | POA: Diagnosis not present

## 2016-05-15 DIAGNOSIS — Z114 Encounter for screening for human immunodeficiency virus [HIV]: Secondary | ICD-10-CM

## 2016-05-15 DIAGNOSIS — Z1159 Encounter for screening for other viral diseases: Secondary | ICD-10-CM

## 2016-05-15 DIAGNOSIS — G40209 Localization-related (focal) (partial) symptomatic epilepsy and epileptic syndromes with complex partial seizures, not intractable, without status epilepticus: Secondary | ICD-10-CM | POA: Diagnosis not present

## 2016-05-15 DIAGNOSIS — G4733 Obstructive sleep apnea (adult) (pediatric): Secondary | ICD-10-CM | POA: Diagnosis not present

## 2016-05-15 LAB — CBC WITH DIFFERENTIAL/PLATELET
Basophils Absolute: 51 cells/uL (ref 0–200)
Basophils Relative: 1 %
EOS PCT: 5 %
Eosinophils Absolute: 255 cells/uL (ref 15–500)
HEMATOCRIT: 37.8 % — AB (ref 38.5–50.0)
Hemoglobin: 13.3 g/dL (ref 13.2–17.1)
LYMPHS PCT: 18 %
Lymphs Abs: 918 cells/uL (ref 850–3900)
MCH: 37.5 pg — ABNORMAL HIGH (ref 27.0–33.0)
MCHC: 35.2 g/dL (ref 32.0–36.0)
MCV: 106.5 fL — ABNORMAL HIGH (ref 80.0–100.0)
MPV: 10.6 fL (ref 7.5–12.5)
Monocytes Absolute: 561 cells/uL (ref 200–950)
Monocytes Relative: 11 %
NEUTROS PCT: 65 %
Neutro Abs: 3315 cells/uL (ref 1500–7800)
Platelets: 129 10*3/uL — ABNORMAL LOW (ref 140–400)
RBC: 3.55 MIL/uL — AB (ref 4.20–5.80)
RDW: 16.1 % — ABNORMAL HIGH (ref 11.0–15.0)
WBC: 5.1 10*3/uL (ref 3.8–10.8)

## 2016-05-15 LAB — COMPREHENSIVE METABOLIC PANEL
ALBUMIN: 3.9 g/dL (ref 3.6–5.1)
ALK PHOS: 116 U/L — AB (ref 40–115)
ALT: 32 U/L (ref 9–46)
AST: 40 U/L — ABNORMAL HIGH (ref 10–35)
BUN: 16 mg/dL (ref 7–25)
CHLORIDE: 108 mmol/L (ref 98–110)
CO2: 22 mmol/L (ref 20–31)
CREATININE: 0.84 mg/dL (ref 0.70–1.33)
Calcium: 8.7 mg/dL (ref 8.6–10.3)
Glucose, Bld: 154 mg/dL — ABNORMAL HIGH (ref 65–99)
POTASSIUM: 4.1 mmol/L (ref 3.5–5.3)
Sodium: 138 mmol/L (ref 135–146)
Total Bilirubin: 2.6 mg/dL — ABNORMAL HIGH (ref 0.2–1.2)
Total Protein: 6.9 g/dL (ref 6.1–8.1)

## 2016-05-15 LAB — MICROALBUMIN, URINE: MICROALB UR: 7.4 mg/dL

## 2016-05-15 LAB — POCT URINALYSIS DIP (MANUAL ENTRY)
Glucose, UA: NEGATIVE
Ketones, POC UA: NEGATIVE
NITRITE UA: NEGATIVE
PH UA: 6
SPEC GRAV UA: 1.025
UROBILINOGEN UA: 2

## 2016-05-15 LAB — LIPID PANEL
CHOLESTEROL: 158 mg/dL (ref <200)
HDL: 45 mg/dL (ref >40)
LDL Cholesterol: 80 mg/dL (ref <100)
TRIGLYCERIDES: 165 mg/dL — AB (ref <150)
Total CHOL/HDL Ratio: 3.5 Ratio (ref <5.0)
VLDL: 33 mg/dL — AB (ref <30)

## 2016-05-15 LAB — HEMOGLOBIN A1C
Hgb A1c MFr Bld: 5.6 % (ref <5.7)
MEAN PLASMA GLUCOSE: 114 mg/dL

## 2016-05-15 LAB — TSH: TSH: 2.37 m[IU]/L (ref 0.40–4.50)

## 2016-05-15 LAB — HIV ANTIBODY (ROUTINE TESTING W REFLEX): HIV 1&2 Ab, 4th Generation: NONREACTIVE

## 2016-05-15 LAB — HEPATITIS C ANTIBODY: HCV AB: NEGATIVE

## 2016-05-15 MED ORDER — ESCITALOPRAM OXALATE 20 MG PO TABS: 20.0000 mg | ORAL_TABLET | Freq: Every day | ORAL | 3 refills | Status: DC

## 2016-05-15 MED ORDER — AZELASTINE HCL 0.1 % NA SOLN: 2.0000 | Freq: Two times a day (BID) | NASAL | 12 refills | Status: DC

## 2016-05-15 NOTE — Progress Notes (Signed)
Subjective:    Patient ID: [name] [name], male    DOB: 1961/02/19, 55 y.o.   MRN: [id number]  05/15/2016  Annual Exam and Medication Refill (patient will discuss refills)   HPI This 55 y.o. male presents for Complete Physical Examination.  Last physical: 07-07-2014 Colonoscopy: 11-2013; repeat in 3 years; multiple polyps. [facility] exam: [facility]; optometrist.  Cataracts B resected due to intermittent prednisone with Crohns. Dental exam: every six months.  Immunization History  Administered Date(s) Administered  . Influenza Split 03/24/2013, 03/24/2014, 03/26/2016  . Influenza Whole 03/24/2012  . Influenza,inj,Quad PF,36+ Mos 02/16/2015  . Pneumococcal Conjugate-13 07/07/2014  . Pneumococcal Polysaccharide-23 06/08/2007  . Tdap 05/15/2016  . Zoster 08/18/2014   Wt Readings from Last 3 Encounters:  05/15/16 228 lb 9.6 oz (103.7 kg)  05/14/16 230 lb (104.3 kg)  05/07/16 231 lb 2 oz (104.8 kg)   BP Readings from Last 3 Encounters:  05/15/16 106/60  05/14/16 121/73  05/07/16 116/64    Complex partial seizures: falls frequently; fell yesterday; suffered rib fracture; no driving; neurologist Krista Blue; followed every six months.  Followed by Cecille Rubin, NP.  Three seizures per month on average.  Has memory issues daily; short term memory is shot.  Applying for disability.  Memory loss for ten years.  Nephrolithiasis: large kidney stones.  Followed by Karsten Ro for prostate care as well.   Rare migraines.  Depression: prescribed by neurology at [facility]. Not doing well emotionally; disability process has been very difficult.  Lexapro 38m daily; would like to increase; seeing therapist.    Review of Systems  Constitutional: Negative for activity change, appetite change, chills, diaphoresis, fatigue, fever and unexpected weight change.  HENT: Negative for congestion, dental problem, drooling, ear discharge, ear pain, facial swelling, hearing loss, mouth sores, nosebleeds, postnasal  drip, rhinorrhea, sinus pressure, sneezing, sore throat, tinnitus, trouble swallowing and voice change.   Eyes: Negative for photophobia, pain, discharge, redness, itching and visual disturbance.  Respiratory: Negative for apnea, cough, choking, chest tightness, shortness of breath, wheezing and stridor.   Cardiovascular: Negative for chest pain, palpitations and leg swelling.  Gastrointestinal: Negative for abdominal pain, blood in stool, constipation, diarrhea, nausea and vomiting.  Endocrine: Negative for cold intolerance, heat intolerance, polydipsia, polyphagia and polyuria.  Genitourinary: Negative for decreased urine volume, difficulty urinating, discharge, dysuria, enuresis, flank pain, frequency, genital sores, hematuria, penile pain, penile swelling, scrotal swelling, testicular pain and urgency.  Musculoskeletal: Negative for arthralgias, back pain, gait problem, joint swelling, myalgias, neck pain and neck stiffness.  Skin: Negative for color change, pallor, rash and wound.  Allergic/Immunologic: Negative for environmental allergies, food allergies and immunocompromised state.  Neurological: Positive for seizures. Negative for dizziness, tremors, syncope, facial asymmetry, speech difficulty, weakness, light-headedness, numbness and headaches.  Hematological: Negative for adenopathy. Does not bruise/bleed easily.  Psychiatric/Behavioral: Positive for dysphoric mood. Negative for agitation, behavioral problems, confusion, decreased concentration, hallucinations, self-injury, sleep disturbance and suicidal ideas. The patient is not nervous/anxious and is not hyperactive.     Past Medical History:  Diagnosis Date  . Adenomatous colon polyp 10/1999  . Cancer ([place]    skin  . Cataract   . Complex partial seizure ([place]   . Crohn's disease of small and large intestines ([place] 1999  . Diabetes mellitus   . Esophageal stricture   . External hemorrhoids   . GERD (gastroesophageal reflux  disease)   . History of nuclear stress test 11/15/2010   exercise myoview; normal pattern of perfusion; normal, low risk  study   . Hyperlipemia   . Hypertension   . Kidney stones   . Migraines   . MRSA (methicillin resistant Staphylococcus aureus) 10/2010   . Osteoporosis   . Pneumonia   . PONV (postoperative nausea and vomiting)   . Seizures ([place])   . Skin cancer    squamous cell multiple; [place]; followed every 3 months.  . Sleep apnea    CPAP machine   Past Surgical History:  Procedure Laterality Date  . CATARACT EXTRACTION Bilateral   . ELBOW SURGERY  2012   elbow MRSA infection   . EYE SURGERY    . SHOULDER SURGERY    . SINUS SURGERY WITH INSTATRAK    . TRANSTHORACIC ECHOCARDIOGRAM  02/22/2011   EF 55-65%; increased pattern of LVH with mild conc hypertrophy, abnormal relaxation & increased filling pressure (grade 2 diastolic dysfunction); atrial septum thickened (lipomatous hypertrophy)  . VASECTOMY     Allergies  Allergen Reactions  . Azithromycin Swelling  . Dilaudid [Hydromorphone Hcl] Nausea And Vomiting  . Penicillins Hives  . Sulfonamide Derivatives Hives    Social History   Social History  . Marital status: Married    Spouse name: [name]  . Number of children: 2  . Years of education: college   Occupational History  . disabled     seizure disorder with memory impairment   Social History Main Topics  . Smoking status: Never Smoker  . Smokeless tobacco: Never Used  . Alcohol use No  . Drug use: No  . Sexual activity: Yes    Birth control/ protection: Surgical     Comment: 1 partner in last 12 months   Other Topics Concern  . Not on file   Social History Narrative   Marital status: married x 32 years;       Lives:   lives at home with his family.      Children:  two children; no grandchildren.  Son is special needs at age 74yo; Cornelian Delaine with TOF.       Employment: unemployed; [employer] long term disability      Tobacco: none       Alcohol: none      Drugs: none      Exercise: unable to exercise due to frequent falls   Patient has a college education.   Patient is right-handed.   Patient drinks one cup of soda daily.   Family History  Problem Relation Age of Onset  . Colon polyps Father   . Stroke Father   . Hyperlipidemia Father   . Hypertension Father   . Heart disease Mother     CABG at age 67  . Hyperlipidemia Mother   . Hypertension Mother   . Stroke Paternal Grandmother   . Stroke Paternal Grandfather   . Other Child     tetrology of fallot (cornealia deland syndrome)  . Colon cancer Neg Hx        Objective:    BP 106/60 (BP Location: Left Arm, Patient Position: Sitting, Cuff Size: Large)   Pulse 70   Temp 97.8 F (36.6 C) (Oral)   Resp 16   Ht 6' (1.829 m)   Wt 228 lb 9.6 oz (103.7 kg)   SpO2 97%   BMI 31.00 kg/m  Physical Exam  Constitutional: He is oriented to person, place, and time. He appears well-developed and well-nourished. No distress.  HENT:  Head: Normocephalic and atraumatic.  Right Ear: External ear normal.  Left Ear:  External ear normal.  Nose: Nose normal.  Mouth/Throat: Oropharynx is clear and moist.  Eyes: Conjunctivae and EOM are normal. Pupils are equal, round, and reactive to light.  Neck: Normal range of motion. Neck supple. Carotid bruit is not present. No thyromegaly present.  Cardiovascular: Normal rate, regular rhythm, normal heart sounds and intact distal pulses.  Exam reveals no gallop and no friction rub.   No murmur heard. Pulmonary/Chest: Effort normal and breath sounds normal. He has no wheezes. He has no rales.  Abdominal: Soft. Bowel sounds are normal. He exhibits no distension and no mass. There is no tenderness. There is no rebound and no guarding.  Musculoskeletal:       Right shoulder: Normal.       Left shoulder: Normal.       Cervical back: Normal.  Lymphadenopathy:    He has no cervical adenopathy.  Neurological: He is alert and oriented  to person, place, and time. He has normal reflexes. No cranial nerve deficit. He exhibits normal muscle tone. Coordination normal.  Skin: Skin is warm and dry. No rash noted. He is not diaphoretic.  Psychiatric: He has a normal mood and affect. His behavior is normal. Judgment and thought content normal.   Fall Risk  05/15/2016 11/14/2015 10/06/2014 07/07/2014  Falls in the past year? Yes Yes No Yes  Number falls in past yr: 2 or more 2 or more - 2 or more  Injury with Fall? Yes No - -   Depression screen [facility] 2/9 05/15/2016 06/03/2015 02/16/2015 10/06/2014 07/07/2014  Decreased Interest 2 0 0 0 2  Down, Depressed, Hopeless 3 0 0 0 2  PHQ - 2 Score 5 0 0 0 4  Altered sleeping 3 - - - 3  Tired, decreased energy 1 - - - 3  Change in appetite 2 - - - 2  Feeling bad or failure about yourself  3 - - - 1  Trouble concentrating 3 - - - 1  Moving slowly or fidgety/restless 3 - - - 2  Suicidal thoughts 3 - - - 0  PHQ-9 Score 23 - - - 16  Some recent data might be hidden       Assessment & Plan:   1. Routine physical examination   2. OSA on CPAP   3. Type 2 diabetes mellitus without complication, without long-term current use of insulin (HCC)   4. Partial symptomatic epilepsy with complex partial seizures, not intractable, without status epilepticus ([place])   5. Memory loss   6. Screening for HIV (human immunodeficiency virus)   7. Need for hepatitis C screening test   8. Screening for prostate cancer   9. Need for Tdap vaccination   10. Essential hypertension, benign   11. Crohn's disease of both small and large intestine without complication ([place])    -anticipatory guidance --- exercise, weight loss. -s/p TDAP. -obtain age appropriate screening labs. -refills provided. -increase Lexapro to 64m daily for depression. -add Astelin for uncontrolled allergic rhinitis. -followed closely by neurology for seizure disorder that interferes with ability to work. -followed by dermatology closely for  skin cancer hx. -followed closely by GI due to Crohn's disease  Orders Placed This Encounter  Procedures  . Tdap vaccine greater than or equal to 7yo IM  . CBC with Differential/Platelet  . Comprehensive metabolic panel    Order Specific Question:   Has the patient fasted?    Answer:   Yes  . Lipid panel    Order Specific Question:  Has the patient fasted?    Answer:   Yes  . HIV antibody  . TSH  . Hepatitis C antibody  . Hemoglobin A1c  . Microalbumin, urine  . POCT urinalysis dipstick  . EKG 12-Lead   Meds ordered this encounter  Medications  . DISCONTD: escitalopram (LEXAPRO) 20 MG tablet    Sig: Take 1 tablet (20 mg total) by mouth daily.    Dispense:  90 tablet    Refill:  3  . azelastine (ASTELIN) 0.1 % nasal spray    Sig: Place 2 sprays into both nostrils 2 (two) times daily. Use in each nostril as directed    Dispense:  30 mL    Refill:  12    Return in about 3 months (around 08/15/2016) for recheck diabetes, depression.   Viona Hosking Elayne Guerin, M.D. [facility] [address] [place], [place]  [address] [phone] phone [phone] fax

## 2016-05-15 NOTE — Patient Instructions (Addendum)
IF you received an x-ray today, you will receive an invoice from [facility]. Please contact [facility] at [phone] with questions or concerns regarding your invoice.   IF you received labwork today, you will receive an invoice from [employer]. Please contact [facility] at [phone] with questions or concerns regarding your invoice.   Our billing staff will not be able to assist you with questions regarding bills from these companies.  You will be contacted with the lab results as soon as they are available. The fastest way to get your results is to activate your My Chart account. Instructions are located on the last page of this paperwork. If you have not heard from [place] regarding the results in 2 weeks, please contact this office.    Keeping you healthy  Get these tests  Blood pressure- Have your blood pressure checked once a year by your healthcare provider.  Normal blood pressure is 120/80  Weight- Have your body mass index (BMI) calculated to screen for obesity.  BMI is a measure of body fat based on height and weight. You can also calculate your own BMI at ViewBanking.[email or web address].  Cholesterol- Have your cholesterol checked every year.  Diabetes- Have your blood sugar checked regularly if you have high blood pressure, high cholesterol, have a family history of diabetes or if you are overweight.  Screening for Colon Cancer- Colonoscopy starting at age 62.  Screening may begin sooner depending on your family history and other health conditions. Follow up colonoscopy as directed by your Gastroenterologist.  Screening for Prostate Cancer- Both blood work (PSA) and a rectal exam help screen for Prostate Cancer.  Screening begins at age 72 with African-American men and at age 66 with Caucasian men.  Screening may begin sooner depending on your family history.  Take these medicines  Aspirin- One aspirin daily can help prevent Heart  disease and Stroke.  Flu shot- Every fall.  Tetanus- Every 10 years.  Zostavax- Once after the age of 29 to prevent Shingles.  Pneumonia shot- Once after the age of 26; if you are younger than 66, ask your healthcare provider if you need a Pneumonia shot.  Take these steps  Don't smoke- If you do smoke, talk to your doctor about quitting.  For tips on how to quit, go to www.smokefree.gov or call 1-800-QUIT-NOW.  Be physically active- Exercise 5 days a week for at least 30 minutes.  If you are not already physically active start slow and gradually work up to 30 minutes of moderate physical activity.  Examples of moderate activity include walking briskly, mowing the yard, dancing, swimming, bicycling, etc.  Eat a healthy diet- Eat a variety of healthy food such as fruits, vegetables, low fat milk, low fat cheese, yogurt, lean meant, poultry, fish, beans, tofu, etc. For more information go to www.thenutritionsource.org  Drink alcohol in moderation- Limit alcohol intake to less than two drinks a day. Never drink and drive.  Dentist- Brush and floss twice daily; visit your dentist twice a year.  Depression- Your emotional health is as important as your physical health. If you're feeling down, or losing interest in things you would normally enjoy please talk to your healthcare provider.  Eye exam- Visit your eye doctor every year.  Safe sex- If you may be exposed to a sexually transmitted infection, use a condom.  Seat belts- Seat belts can save your life; always wear one.  Smoke/Carbon Monoxide detectors- These detectors need to be installed on  the appropriate level of your home.  Replace batteries at least once a year.  Skin cancer- When out in the sun, cover up and use sunscreen 15 SPF or higher.  Violence- If anyone is threatening you, please tell your healthcare provider.  Living Will/ Health care power of attorney- Speak with your healthcare provider and family.

## 2016-05-15 NOTE — Telephone Encounter (Signed)
Pt changed pharm for Lexapro, so re-sending there.

## 2016-05-20 ENCOUNTER — Other Ambulatory Visit: Payer: Self-pay

## 2016-05-20 DIAGNOSIS — R197 Diarrhea, unspecified: Secondary | ICD-10-CM

## 2016-05-22 ENCOUNTER — Other Ambulatory Visit: Payer: 59

## 2016-05-22 DIAGNOSIS — R197 Diarrhea, unspecified: Secondary | ICD-10-CM

## 2016-05-27 LAB — GASTROINTESTINAL PATHOGEN PANEL PCR
C. difficile Tox A/B, PCR: NOT DETECTED
CRYPTOSPORIDIUM, PCR: NOT DETECTED
Campylobacter, PCR: NOT DETECTED
E coli (ETEC) LT/ST PCR: NOT DETECTED
E coli (STEC) stx1/stx2, PCR: NOT DETECTED
E coli 0157, PCR: NOT DETECTED
GIARDIA LAMBLIA, PCR: NOT DETECTED
NOROVIRUS, PCR: NOT DETECTED
ROTAVIRUS, PCR: NOT DETECTED
Salmonella, PCR: NOT DETECTED
Shigella, PCR: NOT DETECTED

## 2016-05-30 ENCOUNTER — Other Ambulatory Visit: Payer: Self-pay

## 2016-05-30 MED ORDER — AZELASTINE HCL 0.1 % NA SOLN: 2.0000 | Freq: Two times a day (BID) | NASAL | 3 refills | Status: DC

## 2016-06-03 ENCOUNTER — Ambulatory Visit: Payer: Self-pay | Admitting: General Surgery

## 2016-06-08 ENCOUNTER — Other Ambulatory Visit: Payer: Self-pay | Admitting: Family Medicine

## 2016-06-27 ENCOUNTER — Encounter (HOSPITAL_COMMUNITY)
Admission: RE | Admit: 2016-06-27 | Discharge: 2016-06-27 | Disposition: A | Discharge status: Home / Self Care | Payer: 59 | Source: Ambulatory Visit | Attending: General Surgery | Admitting: General Surgery

## 2016-06-27 ENCOUNTER — Encounter (HOSPITAL_COMMUNITY): Payer: Self-pay

## 2016-06-27 DIAGNOSIS — Z01812 Encounter for preprocedural laboratory examination: Secondary | ICD-10-CM | POA: Diagnosis not present

## 2016-06-27 HISTORY — DX: Depression, unspecified: F32.A

## 2016-06-27 HISTORY — DX: Anxiety disorder, unspecified: F41.9

## 2016-06-27 HISTORY — DX: Unspecified osteoarthritis, unspecified site: M19.90

## 2016-06-27 HISTORY — DX: Major depressive disorder, single episode, unspecified: F32.9

## 2016-06-27 HISTORY — DX: Personal history of urinary calculi: Z87.442

## 2016-06-27 LAB — CBC
HEMATOCRIT: 36.2 % — AB (ref 39.0–52.0)
HEMOGLOBIN: 13.1 g/dL (ref 13.0–17.0)
MCH: 37 pg — ABNORMAL HIGH (ref 26.0–34.0)
MCHC: 36.2 g/dL — AB (ref 30.0–36.0)
MCV: 102.3 fL — ABNORMAL HIGH (ref 78.0–100.0)
Platelets: 131 10*3/uL — ABNORMAL LOW (ref 150–400)
RBC: 3.54 MIL/uL — AB (ref 4.22–5.81)
RDW: 15.2 % (ref 11.5–15.5)
WBC: 6.4 10*3/uL (ref 4.0–10.5)

## 2016-06-27 LAB — BASIC METABOLIC PANEL
ANION GAP: 7 (ref 5–15)
BUN: 11 mg/dL (ref 6–20)
CO2: 22 mmol/L (ref 22–32)
Calcium: 8.9 mg/dL (ref 8.9–10.3)
Chloride: 109 mmol/L (ref 101–111)
Creatinine, Ser: 1.07 mg/dL (ref 0.61–1.24)
GFR calc non Af Amer: >60 mL/min (ref >60)
GLUCOSE: 138 mg/dL — AB (ref 65–99)
POTASSIUM: 3.9 mmol/L (ref 3.5–5.1)
Sodium: 138 mmol/L (ref 135–145)

## 2016-06-27 LAB — GLUCOSE, CAPILLARY: GLUCOSE-CAPILLARY: 147 mg/dL — AB (ref 65–99)

## 2016-06-27 NOTE — Pre-Procedure Instructions (Signed)
[name] [name]  06/27/2016      [id number] - [place], [place]. [place] [address] Phone: [phone] Fax: [phone]  [facility], [place] [address] [place] [address] Phone: [phone] Fax: [phone]    Your procedure is scheduled on   Monday  07/01/16  Report to [facility] Admitting at 800 A.M.  Call this number if you have problems the morning of surgery:  [phone]   Remember:  Do not eat food or drink liquids after midnight.  Take these medicines the morning of surgery with A SIP OF WATER    -   DILTIAZEM (TIAZAC), ESCITALOPRAM (LEXAPRO) ,NEXIUM, HYDROCODONE IF NEEDED, LACOSAMIDE (VIMPAT),  METOPROLOL(LOPRESSOR)         (STOP 7 DAYS PRIOR TO SURGERY- ASPIRIN, IBUPROFEN/ ADVIL/ MOTRIN, GOODY POWDERS, BC'S, HERBAL MEDICINES)    How to Manage Your Diabetes Before and After Surgery  Why is it important to control my blood sugar before and after surgery? . Improving blood sugar levels before and after surgery helps healing and can limit problems. . A way of improving blood sugar control is eating a healthy diet by: o  Eating less sugar and carbohydrates o  Increasing activity/exercise o  Talking with your doctor about reaching your blood sugar goals . High blood sugars (greater than 180 mg/dL) can raise your risk of infections and slow your recovery, so you will need to focus on controlling your diabetes during the weeks before surgery. . Make sure that the doctor who takes care of your diabetes knows about your planned surgery including the date and location.  How do I manage my blood sugar before surgery? . Check your blood sugar at least 4 times a day, starting 2 days before surgery, to make sure that the level is not too high or low. o Check your blood sugar the morning of your surgery when you wake up and every 2 hours  until you get to the Short Stay unit. . If your blood sugar is less than 70 mg/dL, you will need to treat for low blood sugar: o Do not take insulin. o Treat a low blood sugar (less than 70 mg/dL) with  cup of clear juice (cranberry or apple), 4 glucose tablets, OR glucose gel. o Recheck blood sugar in 15 minutes after treatment (to make sure it is greater than 70 mg/dL). If your blood sugar is not greater than 70 mg/dL on recheck, call [phone] for further instructions. . Report your blood sugar to the short stay nurse when you get to Short Stay.  . If you are admitted to the hospital after surgery: o Your blood sugar will be checked by the staff and you will probably be given insulin after surgery (instead of oral diabetes medicines) to make sure you have good blood sugar levels. o The goal for blood sugar control after surgery is 80-180 mg/dL.              WHAT DO I DO ABOUT MY DIABETES MEDICATION?   Marland Kitchen Do not take oral diabetes medicines (pills) the morning of surgery.  .          Other Instructions:          Patient Signature:  Date:   Nurse Signature:  Date:   Reviewed and Endorsed by [facility] Patient Education Committee, August 2015  Do not wear jewelry,  make-up or nail polish.  Do not wear lotions, powders, or perfumes, or deoderant.  Do not shave 48 hours prior to surgery.  Men may shave face and neck.  Do not bring valuables to the hospital.  [facility] is not responsible for any belongings or valuables.  Contacts, dentures or bridgework may not be worn into surgery.  Leave your suitcase in the car.  After surgery it may be brought to your room.  For patients admitted to the hospital, discharge time will be determined by your treatment team.  Patients discharged the day of surgery will not be allowed to drive home.   Name and phone number of your driver:    Special instructions:  [place] - Preparing for Surgery  Before surgery, you can  play an important role.  Because skin is not sterile, your skin needs to be as free of germs as possible.  You can reduce the number of germs on you skin by washing with CHG (chlorahexidine gluconate) soap before surgery.  CHG is an antiseptic cleaner which kills germs and bonds with the skin to continue killing germs even after washing.  Please DO NOT use if you have an allergy to CHG or antibacterial soaps.  If your skin becomes reddened/irritated stop using the CHG and inform your nurse when you arrive at Short Stay.  Do not shave (including legs and underarms) for at least 48 hours prior to the first CHG shower.  You may shave your face.  Please follow these instructions carefully:   1.  Shower with CHG Soap the night before surgery and the                                morning of Surgery.  2.  If you choose to wash your hair, wash your hair first as usual with your       normal shampoo.  3.  After you shampoo, rinse your hair and body thoroughly to remove the                      Shampoo.  4.  Use CHG as you would any other liquid soap.  You can apply chg directly       to the skin and wash gently with scrungie or a clean washcloth.  5.  Apply the CHG Soap to your body ONLY FROM THE NECK DOWN.        Do not use on open wounds or open sores.  Avoid contact with your eyes,       ears, mouth and genitals (private parts).  Wash genitals (private parts)       with your normal soap.  6.  Wash thoroughly, paying special attention to the area where your surgery        will be performed.  7.  Thoroughly rinse your body with warm water from the neck down.  8.  DO NOT shower/wash with your normal soap after using and rinsing off       the CHG Soap.  9.  Pat yourself dry with a clean towel.            10.  Wear clean pajamas.            11.  Place clean sheets on your bed the night of your first shower and do not        sleep with pets.  Day of Surgery  Do not apply any lotions/deoderants the morning  of surgery.  Please wear clean clothes to the hospital/surgery center.    Please read over the following fact sheets that you were given. Pain Booklet and Surgical Site Infection Prevention

## 2016-06-27 NOTE — Progress Notes (Signed)
Seizure history- using Vimpat for treatment.  Uses CPAP, no O2 q night. Pt. Followed for Crohn's by GI & takes multiple oral meds.  Pt. reports that he is seeing psych. Therapist to manage emotions, also uses [facility] for therapy /w antidepressant. Had a stress test & Echo in past both being normal. PCP- [facility] [address]. Marland Kitchen

## 2016-06-28 MED ORDER — VANCOMYCIN HCL 10 G IV SOLR
1500.0000 mg | INTRAVENOUS | Status: DC
  Filled 2016-06-28 (×2): qty 1500

## 2016-06-28 MED ORDER — VANCOMYCIN HCL 10 G IV SOLR
1500.0000 mg | INTRAVENOUS | Status: AC
  Administered 2016-07-01: 1500 mg via INTRAVENOUS
  Filled 2016-06-28: qty 1500

## 2016-06-28 NOTE — Progress Notes (Signed)
Anesthesia Chart Review: Patient is a 56 year old male scheduled for umbilical hernia repair with mesh on 07/01/16 by Dr. Zella Richer.  History includes nonsmoker, OSA (CPAP), post-operative N/V, HTN, GERD, osteoporosis, Crohn's disease, MRSA '12, DM2, migraines, HLD, seizure disorder, skin cancer, depression, anxiety, arthritis, nephrolithiasis. He saw Dr. Lyman Bishop following a normal stress test for evaluation of chest pain and shortness of breath.  PCP is Dr. Reginia Forts. GI is Dr. Lucio Edward. Neurologist is Dr. Marcial Pacas.  Meds include diltiazem, folic acid, metformin, Lopressor, KCl, Lexapro, azelastine nasal spray, Purinethol, Lialda, pamine forte, Vimpat, ferrous sulfate, Florastor, ASA 81 mg, Claritin, Relpax, Nexium.   BP 120/75   Pulse 67   Temp 36.7 C   Resp 20   Ht 5' 11"  (1.803 m)   Wt 226 lb 14.4 oz (102.9 kg)   SpO2 98%   BMI 31.65 kg/m   EKG 05/15/16: SR, LAE.  Nuclear stress test 03/17/13: Overall Impression:   Normal stress nuclear study. LV Wall Motion:  NL LV Function; NL Wall Motion. EF 66%.  Echo 09/25/11: Impressions: - Normal LV size with mild LV hypertrophy, EF 60-65%. Normal RV size and systolic function. No significant valvular abnormalities. Mildly dilated aortic root (40 mm).  Left ribs/chest xray 05/14/16: IMPRESSION: Minimally displaced fracture of the anterior left eleventh rib, favored to be acute. No pleural fluid or pneumothorax. Cardiomegaly without congestive failure.  Brain MRI 11/01/15: IMPRESSION:   1.    The brain has normal parenchymal signal before and after contrast. 2.    Mild generalized cortical atrophy 3.    There are no acute findings.    There are no changes compared to the MRI from 08/26/2013.  Preoperative labs noted. A1c 5.6 on 05/15/16.  If no acute changes then I would anticipate that he can proceed as planned.  George Hugh [facility] Short Stay Center/Anesthesiology Phone [phone] 06/28/2016 10:53 AM

## 2016-07-01 ENCOUNTER — Ambulatory Visit (HOSPITAL_COMMUNITY)
Admission: RE | Admit: 2016-07-01 | Discharge: 2016-07-01 | Disposition: A | Discharge status: Home / Self Care | Payer: 59 | Source: Ambulatory Visit | Attending: General Surgery | Admitting: General Surgery

## 2016-07-01 ENCOUNTER — Ambulatory Visit (HOSPITAL_COMMUNITY): Payer: 59 | Admitting: Certified Registered"

## 2016-07-01 ENCOUNTER — Encounter (HOSPITAL_COMMUNITY)
Admission: RE | Disposition: A | Discharge status: Home / Self Care | Payer: Self-pay | Source: Ambulatory Visit | Attending: General Surgery

## 2016-07-01 ENCOUNTER — Ambulatory Visit (HOSPITAL_COMMUNITY): Payer: 59 | Admitting: Vascular Surgery

## 2016-07-01 ENCOUNTER — Encounter (HOSPITAL_COMMUNITY): Payer: Self-pay | Admitting: Certified Registered"

## 2016-07-01 DIAGNOSIS — Z79899 Other long term (current) drug therapy: Secondary | ICD-10-CM | POA: Diagnosis not present

## 2016-07-01 DIAGNOSIS — Z85820 Personal history of malignant melanoma of skin: Secondary | ICD-10-CM | POA: Diagnosis not present

## 2016-07-01 DIAGNOSIS — Z6831 Body mass index (BMI) 31.0-31.9, adult: Secondary | ICD-10-CM | POA: Diagnosis not present

## 2016-07-01 DIAGNOSIS — Z7982 Long term (current) use of aspirin: Secondary | ICD-10-CM | POA: Diagnosis not present

## 2016-07-01 DIAGNOSIS — I10 Essential (primary) hypertension: Secondary | ICD-10-CM | POA: Diagnosis not present

## 2016-07-01 DIAGNOSIS — G4730 Sleep apnea, unspecified: Secondary | ICD-10-CM | POA: Diagnosis not present

## 2016-07-01 DIAGNOSIS — F419 Anxiety disorder, unspecified: Secondary | ICD-10-CM | POA: Diagnosis not present

## 2016-07-01 DIAGNOSIS — Z7984 Long term (current) use of oral hypoglycemic drugs: Secondary | ICD-10-CM | POA: Insufficient documentation

## 2016-07-01 DIAGNOSIS — F329 Major depressive disorder, single episode, unspecified: Secondary | ICD-10-CM | POA: Diagnosis not present

## 2016-07-01 DIAGNOSIS — K219 Gastro-esophageal reflux disease without esophagitis: Secondary | ICD-10-CM | POA: Insufficient documentation

## 2016-07-01 DIAGNOSIS — K429 Umbilical hernia without obstruction or gangrene: Secondary | ICD-10-CM | POA: Diagnosis not present

## 2016-07-01 DIAGNOSIS — G40209 Localization-related (focal) (partial) symptomatic epilepsy and epileptic syndromes with complex partial seizures, not intractable, without status epilepticus: Secondary | ICD-10-CM | POA: Insufficient documentation

## 2016-07-01 DIAGNOSIS — E669 Obesity, unspecified: Secondary | ICD-10-CM | POA: Insufficient documentation

## 2016-07-01 DIAGNOSIS — E119 Type 2 diabetes mellitus without complications: Secondary | ICD-10-CM | POA: Insufficient documentation

## 2016-07-01 DIAGNOSIS — E785 Hyperlipidemia, unspecified: Secondary | ICD-10-CM | POA: Diagnosis not present

## 2016-07-01 HISTORY — PX: UMBILICAL HERNIA REPAIR: SHX196

## 2016-07-01 HISTORY — PX: INSERTION OF MESH: SHX5868

## 2016-07-01 LAB — SURGICAL PCR SCREEN
MRSA, PCR: NEGATIVE
Staphylococcus aureus: POSITIVE — AB

## 2016-07-01 LAB — GLUCOSE, CAPILLARY
Glucose-Capillary: 115 mg/dL — ABNORMAL HIGH (ref 65–99)
Glucose-Capillary: 102 mg/dL — ABNORMAL HIGH (ref 65–99)

## 2016-07-01 SURGERY — REPAIR, HERNIA, UMBILICAL, ADULT
Anesthesia: General | Site: Abdomen

## 2016-07-01 MED ORDER — MEPERIDINE HCL 25 MG/ML IJ SOLN: 6.2500 mg | INTRAMUSCULAR | Status: DC | PRN

## 2016-07-01 MED ORDER — LIDOCAINE HCL (CARDIAC) 20 MG/ML IV SOLN
INTRAVENOUS | Status: DC | PRN
  Administered 2016-07-01: 80 mg via INTRAVENOUS

## 2016-07-01 MED ORDER — FENTANYL CITRATE (PF) 100 MCG/2ML IJ SOLN: 25.0000 ug | INTRAMUSCULAR | Status: DC | PRN

## 2016-07-01 MED ORDER — PHENYLEPHRINE HCL 10 MG/ML IJ SOLN
INTRAMUSCULAR | Status: DC | PRN
  Administered 2016-07-01: 40 ug via INTRAVENOUS
  Administered 2016-07-01: 80 ug via INTRAVENOUS

## 2016-07-01 MED ORDER — LACTATED RINGERS IV SOLN
INTRAVENOUS | Status: DC | PRN
  Administered 2016-07-01: 10:00:00 via INTRAVENOUS

## 2016-07-01 MED ORDER — BUPIVACAINE-EPINEPHRINE 0.5% -1:200000 IJ SOLN
INTRAMUSCULAR | Status: DC | PRN
  Administered 2016-07-01: 14 mL

## 2016-07-01 MED ORDER — 0.9 % SODIUM CHLORIDE (POUR BTL) OPTIME
TOPICAL | Status: DC | PRN
  Administered 2016-07-01: 1000 mL

## 2016-07-01 MED ORDER — FENTANYL CITRATE (PF) 100 MCG/2ML IJ SOLN
INTRAMUSCULAR | Status: AC
  Filled 2016-07-01: qty 4

## 2016-07-01 MED ORDER — PROPOFOL 10 MG/ML IV BOLUS
INTRAVENOUS | Status: DC | PRN
  Administered 2016-07-01: 150 mg via INTRAVENOUS

## 2016-07-01 MED ORDER — CHLORHEXIDINE GLUCONATE CLOTH 2 % EX PADS
6.0000 | MEDICATED_PAD | Freq: Once | CUTANEOUS | Status: DC
Start: 2016-07-01 — End: 2016-07-01

## 2016-07-01 MED ORDER — PROPOFOL 10 MG/ML IV BOLUS
INTRAVENOUS | Status: AC
  Filled 2016-07-01: qty 20

## 2016-07-01 MED ORDER — SODIUM CHLORIDE 0.9% FLUSH: 3.0000 mL | Freq: Two times a day (BID) | INTRAVENOUS | Status: DC

## 2016-07-01 MED ORDER — DEXAMETHASONE SODIUM PHOSPHATE 4 MG/ML IJ SOLN
INTRAMUSCULAR | Status: DC | PRN
  Administered 2016-07-01: 4 mg via INTRAVENOUS

## 2016-07-01 MED ORDER — OXYCODONE HCL 5 MG PO TABS: 5.0000 mg | ORAL_TABLET | ORAL | Status: DC | PRN

## 2016-07-01 MED ORDER — FENTANYL CITRATE (PF) 100 MCG/2ML IJ SOLN
INTRAMUSCULAR | Status: DC | PRN
  Administered 2016-07-01: 50 ug via INTRAVENOUS
  Administered 2016-07-01: 150 ug via INTRAVENOUS

## 2016-07-01 MED ORDER — ROCURONIUM BROMIDE 100 MG/10ML IV SOLN
INTRAVENOUS | Status: DC | PRN
  Administered 2016-07-01: 50 mg via INTRAVENOUS

## 2016-07-01 MED ORDER — SCOPOLAMINE 1 MG/3DAYS TD PT72
1.0000 | MEDICATED_PATCH | TRANSDERMAL | Status: DC
  Administered 2016-07-01: 1.5 mg via TRANSDERMAL
  Filled 2016-07-01: qty 1

## 2016-07-01 MED ORDER — ONDANSETRON HCL 4 MG/2ML IJ SOLN
INTRAMUSCULAR | Status: DC | PRN
  Administered 2016-07-01: 4 mg via INTRAVENOUS

## 2016-07-01 MED ORDER — SODIUM CHLORIDE 0.9 % IV SOLN: 250.0000 mL | INTRAVENOUS | Status: DC | PRN

## 2016-07-01 MED ORDER — MIDAZOLAM HCL 5 MG/5ML IJ SOLN
INTRAMUSCULAR | Status: DC | PRN
  Administered 2016-07-01: 2 mg via INTRAVENOUS

## 2016-07-01 MED ORDER — ACETAMINOPHEN 650 MG RE SUPP: 650.0000 mg | RECTAL | Status: DC | PRN

## 2016-07-01 MED ORDER — EPHEDRINE SULFATE 50 MG/ML IJ SOLN
INTRAMUSCULAR | Status: DC | PRN
  Administered 2016-07-01 (×4): 5 mg via INTRAVENOUS

## 2016-07-01 MED ORDER — HYDROCODONE-ACETAMINOPHEN 7.5-325 MG PO TABS: 1.0000 | ORAL_TABLET | Freq: Once | ORAL | Status: DC | PRN

## 2016-07-01 MED ORDER — SUGAMMADEX SODIUM 200 MG/2ML IV SOLN
INTRAVENOUS | Status: AC
  Filled 2016-07-01: qty 2

## 2016-07-01 MED ORDER — MUPIROCIN 2 % EX OINT
1.0000 "application " | TOPICAL_OINTMENT | Freq: Once | CUTANEOUS | Status: AC
  Administered 2016-07-01: 1 via TOPICAL
  Filled 2016-07-01: qty 22

## 2016-07-01 MED ORDER — METOCLOPRAMIDE HCL 5 MG/ML IJ SOLN: 10.0000 mg | Freq: Once | INTRAMUSCULAR | Status: DC | PRN

## 2016-07-01 MED ORDER — SUGAMMADEX SODIUM 200 MG/2ML IV SOLN
INTRAVENOUS | Status: DC | PRN
  Administered 2016-07-01: 200 mg via INTRAVENOUS

## 2016-07-01 MED ORDER — ACETAMINOPHEN 325 MG PO TABS: 650.0000 mg | ORAL_TABLET | ORAL | Status: DC | PRN

## 2016-07-01 MED ORDER — SODIUM CHLORIDE 0.9% FLUSH: 3.0000 mL | INTRAVENOUS | Status: DC | PRN

## 2016-07-01 MED ORDER — LACTATED RINGERS IV SOLN
INTRAVENOUS | Status: DC
  Administered 2016-07-01: 08:00:00 via INTRAVENOUS

## 2016-07-01 MED ORDER — CHLORHEXIDINE GLUCONATE CLOTH 2 % EX PADS: 6.0000 | MEDICATED_PAD | Freq: Once | CUTANEOUS | Status: DC

## 2016-07-01 MED ORDER — MIDAZOLAM HCL 2 MG/2ML IJ SOLN
INTRAMUSCULAR | Status: AC
  Filled 2016-07-01: qty 2

## 2016-07-01 MED ORDER — OXYCODONE HCL 5 MG PO TABS: 5.0000 mg | ORAL_TABLET | ORAL | 0 refills | Status: DC | PRN

## 2016-07-01 SURGICAL SUPPLY — 41 items
APL SKNCLS STERI-STRIP NONHPOA (GAUZE/BANDAGES/DRESSINGS) ×1
BENZOIN TINCTURE PRP APPL 2/3 (GAUZE/BANDAGES/DRESSINGS) ×3 IMPLANT
CHLORAPREP W/TINT 26ML (MISCELLANEOUS) ×3 IMPLANT
CLOSURE STERI-STRIP 1/2X4 (GAUZE/BANDAGES/DRESSINGS) ×1
CLOSURE WOUND 1/2 X4 (GAUZE/BANDAGES/DRESSINGS) ×1
CLSR STERI-STRIP ANTIMIC 1/2X4 (GAUZE/BANDAGES/DRESSINGS) ×1 IMPLANT
COVER SURGICAL LIGHT HANDLE (MISCELLANEOUS) ×3 IMPLANT
DECANTER SPIKE VIAL GLASS SM (MISCELLANEOUS) ×3 IMPLANT
DRAPE INCISE IOBAN 66X45 STRL (DRAPES) ×3 IMPLANT
DRAPE LAPAROSCOPIC ABDOMINAL (DRAPES) ×3 IMPLANT
DRAPE UTILITY XL STRL (DRAPES) ×6 IMPLANT
ELECT CAUTERY BLADE 6.4 (BLADE) ×3 IMPLANT
ELECT REM PT RETURN 9FT ADLT (ELECTROSURGICAL) ×3
ELECTRODE REM PT RTRN 9FT ADLT (ELECTROSURGICAL) ×1 IMPLANT
GAUZE SPONGE 4X4 12PLY STRL (GAUZE/BANDAGES/DRESSINGS) ×3 IMPLANT
GAUZE SPONGE 4X4 16PLY XRAY LF (GAUZE/BANDAGES/DRESSINGS) ×3 IMPLANT
GLOVE BIOGEL PI IND STRL 8 (GLOVE) ×1 IMPLANT
GLOVE BIOGEL PI INDICATOR 8 (GLOVE) ×2
GLOVE ECLIPSE 8.0 STRL XLNG CF (GLOVE) ×3 IMPLANT
GOWN STRL REUS W/ TWL LRG LVL3 (GOWN DISPOSABLE) ×2 IMPLANT
GOWN STRL REUS W/TWL LRG LVL3 (GOWN DISPOSABLE) ×6
KIT BASIN OR (CUSTOM PROCEDURE TRAY) ×3 IMPLANT
KIT ROOM TURNOVER OR (KITS) ×3 IMPLANT
MESH HERNIA 3X6 (Mesh General) ×2 IMPLANT
NDL HYPO 25GX1X1/2 BEV (NEEDLE) ×1 IMPLANT
NEEDLE HYPO 25GX1X1/2 BEV (NEEDLE) ×3 IMPLANT
NS IRRIG 1000ML POUR BTL (IV SOLUTION) ×3 IMPLANT
PACK SURGICAL SETUP 50X90 (CUSTOM PROCEDURE TRAY) ×3 IMPLANT
PAD ARMBOARD 7.5X6 YLW CONV (MISCELLANEOUS) ×6 IMPLANT
PENCIL BUTTON HOLSTER BLD 10FT (ELECTRODE) ×3 IMPLANT
STRIP CLOSURE SKIN 1/2X4 (GAUZE/BANDAGES/DRESSINGS) ×2 IMPLANT
SUT MNCRL AB 4-0 PS2 18 (SUTURE) ×3 IMPLANT
SUT NOVA NAB DX-16 0-1 5-0 T12 (SUTURE) ×2 IMPLANT
SUT PROLENE 0 CT 1 30 (SUTURE) ×3 IMPLANT
SUT SURG 0 T 19/GS 22 1969 62 (SUTURE) ×1 IMPLANT
SUT VIC AB 3-0 SH 27 (SUTURE) ×3
SUT VIC AB 3-0 SH 27XBRD (SUTURE) ×1 IMPLANT
SYR CONTROL 10ML LL (SYRINGE) ×3 IMPLANT
TOWEL OR 17X24 6PK STRL BLUE (TOWEL DISPOSABLE) ×3 IMPLANT
TOWEL OR 17X26 10 PK STRL BLUE (TOWEL DISPOSABLE) ×3 IMPLANT
WATER STERILE IRR 1000ML POUR (IV SOLUTION) IMPLANT

## 2016-07-01 NOTE — Interval H&P Note (Signed)
History and Physical Interval Note:  07/01/2016 10:07 AM  Florene Route  has presented today for surgery, with the diagnosis of umbilical hernia  The various methods of treatment have been discussed with the patient and family. After consideration of risks, benefits and other options for treatment, the patient has consented to  Procedure(s): UMBILICAL HERNIA REPAIR WITH MESH (N/A) INSERTION OF MESH (N/A) as a surgical intervention .  The patient's history has been reviewed, patient examined, no change in status, stable for surgery.  I have reviewed the patient's chart and labs.  Questions were answered to the patient's satisfaction.     Rhoderick Farrel Lenna Sciara

## 2016-07-01 NOTE — Anesthesia Procedure Notes (Signed)
Procedure Name: Intubation Date/Time: 07/01/2016 10:26 AM Performed by: Gaylene Brooks Pre-anesthesia Checklist: Patient identified, Emergency Drugs available, Suction available and Patient being monitored Patient Re-evaluated:Patient Re-evaluated prior to inductionOxygen Delivery Method: Circle System Utilized Preoxygenation: Pre-oxygenation with 100% oxygen Intubation Type: IV induction Ventilation: Mask ventilation without difficulty Laryngoscope Size: Glidescope and 3 Grade View: Grade I Tube type: Oral Tube size: 7.5 mm Number of attempts: 1 Airway Equipment and Method: Stylet and Oral airway Placement Confirmation: ETT inserted through vocal cords under direct vision,  positive ETCO2 and breath sounds checked- equal and bilateral Secured at: 23 cm Tube secured with: Tape Dental Injury: Teeth and Oropharynx as per pre-operative assessment  Comments: Glidescope was used electively. We used it to show a student observer the airway anatomy.

## 2016-07-01 NOTE — Discharge Instructions (Signed)
[facility] _______[facility]  UMBILICAL  HERNIA REPAIR: POST OP INSTRUCTIONS  Always review your discharge instruction sheet given to you by the facility where your surgery was performed. IF YOU HAVE DISABILITY OR FAMILY LEAVE FORMS, YOU MUST BRING THEM TO THE OFFICE FOR PROCESSING.   DO NOT GIVE THEM TO YOUR DOCTOR.  1. A  prescription for pain medication may be given to you upon discharge.  Take your pain medication as prescribed, if needed.  If narcotic pain medicine is not needed, then you may take acetaminophen (Tylenol) or ibuprofen (Advil) as needed. 2. Take your usually prescribed medications unless otherwise directed. 3. If you need a refill on your pain medication, please contact your pharmacy.  They will contact our office to request authorization. Prescriptions will not be filled after 5 pm or on week-ends. 4. You should follow a light diet the first 24 hours after arrival home, such as soup and crackers, etc.  Be sure to include lots of fluids daily.  Resume your normal diet the day after surgery. 5. Most patients will experience some swelling and bruising around the umbilicus.  Ice packs and reclining will help.  Swelling and bruising can take several days to resolve. Do not lie flat. 6. It is common to experience some constipation if taking pain medication after surgery.  Increasing fluid intake and taking a stool softener (such as Colace) will usually help or prevent this problem from occurring.  A mild laxative (Milk of Magnesia or Miralax) should be taken according to package directions if there are no bowel movements after 48 hours. 7. Unless discharge instructions indicate otherwise, you may remove your bandages 72 hours after surgery.  You may shower the day after surgery.  You may have steri-strips (small skin tapes) in place directly over the incision.  These strips should be left on the skin until they fall off.  If your surgeon used skin glue on the incision, you may  shower in 24 hours.  The glue will flake off over the next 2-3 weeks.  Any sutures or staples will be removed at the office during your follow-up visit. 8. ACTIVITIES:  You may resume light daily activities beginning the next day--such as daily self-care, walking, climbing stairs--gradually increasing activities as tolerated. Do not lie flat for the first 2-3 days. You may have sexual intercourse when it is comfortable.  Refrain from any heavy lifting or straining-nothing over 10 pounds for 6 weeks.   a. You may drive when you are no longer taking prescription pain medication, you can comfortably wear a seatbelt, and you can safely maneuver your car and apply brakes. b. RETURN TO WORK:  _Desk type work in one week, full duty in 6 weeks._________________________________________________________ 9. You should see your doctor in the office for a follow-up appointment approximately 2-3 weeks after your surgery.  Make sure that you call for this appointment within a day or two after you arrive home to insure a convenient appointment time. 10. OTHER INSTRUCTIONS:  __________________________________________________________________________________________________________________________________________________________________________________________  WHEN TO CALL YOUR DOCTOR: 1. Fever over 101.0 2. Inability to urinate 3. Nausea and/or vomiting 4. Extreme swelling or bruising 5. Continued bleeding from incision. 6. Increased pain, redness, or drainage from the incision  The clinic staff is available to answer your questions during regular business hours.  Please dont hesitate to call and ask to speak to one of the nurses for clinical concerns.  If you have a medical emergency, go to the nearest emergency room or  call 911.  A surgeon from [facility] is always on call at the hospital   [address], [place], [place], [place]  [address] ?  [address] [place], [place], [place]   [address] [phone] ? [phone] ? FAX [phone] Web site: [email or web address]

## 2016-07-01 NOTE — Anesthesia Preprocedure Evaluation (Signed)
Anesthesia Evaluation  Patient identified by MRN, date of birth, ID band Patient awake    Reviewed: Allergy & Precautions, NPO status , Patient's Chart, lab work & pertinent test results, reviewed documented beta blocker date and time   History of Anesthesia Complications (+) PONV and history of anesthetic complications  Airway Mallampati: III       Dental no notable dental hx. (+) Teeth Intact   Pulmonary sleep apnea and Continuous Positive Airway Pressure Ventilation , pneumonia,    Pulmonary exam normal breath sounds clear to auscultation       Cardiovascular Exercise Tolerance: Poor hypertension, Pt. on medications and Pt. on home beta blockers + DOE  Normal cardiovascular exam Rhythm:Regular Rate:Normal     Neuro/Psych  Headaches, Seizures -, Well Controlled,  PSYCHIATRIC DISORDERS Anxiety Depression Complex partial seizures    GI/Hepatic Neg liver ROS, GERD  Medicated and Controlled,Crohn's disease   Endo/Other  diabetes, Well Controlled, Type 2, Oral Hypoglycemic AgentsHyperlipidemia  Renal/GU Hx/o renal calculi  negative genitourinary   Musculoskeletal  (+) Arthritis , Osteoarthritis,  Umbilical hernia Osteoporosis   Abdominal (+) + obese,   Peds  Hematology Thrombocytopenia   Anesthesia Other Findings   Reproductive/Obstetrics                               Chemistry      Component Value Date/Time   NA 138 06/27/2016 0928   NA 143 10/23/2015 0932   K 3.9 06/27/2016 0928   CL 109 06/27/2016 0928   CO2 22 06/27/2016 0928   BUN 11 06/27/2016 0928   BUN 14 10/23/2015 0932   CREATININE 1.07 06/27/2016 0928   CREATININE 0.84 05/15/2016 1005      Component Value Date/Time   CALCIUM 8.9 06/27/2016 0928   ALKPHOS 116 (H) 05/15/2016 1005   AST 40 (H) 05/15/2016 1005   ALT 32 05/15/2016 1005   BILITOT 2.6 (H) 05/15/2016 1005     Lab Results  Component Value Date   WBC 6.4  06/27/2016   HGB 13.1 06/27/2016   HCT 36.2 (L) 06/27/2016   MCV 102.3 (H) 06/27/2016   PLT 131 (L) 06/27/2016   EKG: normal sinus rhythm, LAE. Nuclear stress test 03/17/2013: Normal stress nuclear study. Anesthesia Physical Anesthesia Plan  ASA: III  Anesthesia Plan: General   Post-op Pain Management:    Induction: Intravenous  Airway Management Planned: Oral ETT  Additional Equipment:   Intra-op Plan:   Post-operative Plan: Extubation in OR  Informed Consent: I have reviewed the patients History and Physical, chart, labs and discussed the procedure including the risks, benefits and alternatives for the proposed anesthesia with the patient or authorized representative who has indicated his/her understanding and acceptance.   Dental advisory given  Plan Discussed with: Anesthesiologist, CRNA and Surgeon  Anesthesia Plan Comments:         Anesthesia Quick Evaluation

## 2016-07-01 NOTE — Op Note (Signed)
OPERATIVE NOTE- UMBILICAL HERNIA REPAIR  Preoperative diagnosis: Umbilical hernia  Postoperative diagnosis: Same  Procedure: Umbilical hernia repair with mesh.  Surgeon: [name] M.D.  Anesthesia: General plus Marcaine local  EBL:  < 100 ml.   Indication:  This is a 56 year old male with a symptomatic umbilical hernia.  He now presents for repair.  Technique: He was seen in the holding room and then brought to the operating room, placed supine on the operating table, and a general anesthetic was given.  The hair in the periumbilical area was clipped as was necessary.  The periumbilical area was sterilely prepped and draped. A timeout was performed.  Marcaine solution was infiltrated superficially and deep in the periumbilical area. A subumbilical transverse incision was made through the skin and subcutaneous tissue until the fascia was identified. The subcutaneous tissue was mobilized free from the fascia inferiorly and laterally. The umbilical stalk was mobilized and dissected free from the fascia exposing the underlying hernia defect. The hernia sac and tissue within it were reduced back into the abdominal cavity.  The subcutaneous tissue was freed from the fascia for 3-4 cm around the primary defect. The primary defect was then closed with interrupted #1 Novofil sutures. The sutures were left long.  A piece of polypropylene mesh was brought into the field. The primary repair sutures were threaded up through the mesh and tied down anchoring the mesh directly over the primary repair. The periphery of the mesh was then anchored to the fascia with a running 0 Prolene suture to allow for 3-4 cm of mesh overlap.   Hemostasis was adequate at this time. The umbilicus was reimplanted onto the mesh and fascia with 3-0 Vicryl suture. The subcutaneous tissue was closed over the mesh with a running 3-0 Vicryl suture. The skin was closed with a 4-0 Monocryl subcuticular stitch. Steri-Strips and  sterile dressings were applied.  He tolerated the procedure well without any apparent complications and was taken to the recovery room in satisfactory condition.

## 2016-07-01 NOTE — H&P (Signed)
[name] [name]  DOB: 02/27/1961 Married / Language: [name] [name] / Race: White Male   History of Present Illness  The patient is a 56 year old male.  Note:He is referred by Dr. Fuller Plan for consultation regarding a symptomatic umbilical hernia. The hernia has been present for 2-3 years. He has intermittent episodes were gets larger and becomes painful. He has Crohn's disease. A CT scan done recently because of symptoms from his Crohn's disease demonstrated the hernia in the umbilical area containing fat. He also has small bilateral inguinal hernias containing fat. These are completely asymptomatic. He has chronic diarrhea so no straining with bowel movements. No straining with urination. His last "seizure" was approximately 3 weeks ago. Dr. Krista Blue is his neurologist. She feels it is okay to proceed with elective umbilical hernia repair.  Other Problems ( Anxiety Disorder  Back Pain  Crohn's Disease  Depression  Diabetes Mellitus  Gastroesophageal Reflux Disease  High blood pressure  Kidney Stone  Melanoma  Seizure Disorder  Sleep Apnea  Ulcerative Colitis  Umbilical Hernia Repair   Past Surgical History  Cataract Surgery  Bilateral. Colon Polyp Removal - Colonoscopy  Hemorrhoidectomy  Shoulder Surgery  Right. Vasectomy   Allergies ([name], CMA; 05/29/2016 9:25 AM) Azithromycin *CHEMICALS*  Dilaudid *ANALGESICS - OPIOID*  Penicillin G Potassium *PENICILLINS*  Sulfa Antibiotics  Prior to Admission medications   Medication Sig Start Date End Date Taking? Authorizing Provider  aspirin 81 MG tablet Take 81 mg by mouth daily.   Yes Historical Provider, MD  azelastine (ASTELIN) 0.1 % nasal spray Place 2 sprays into both nostrils 2 (two) times daily. Use in each nostril as directed 05/30/16  Yes Wardell Honour, MD  calcium carbonate (CALCI-CHEW) 1250 (500 Ca) MG chewable tablet Chew 2 tablets by mouth daily.   Yes Historical Provider, MD  CALCIUM PO Take 2  tablets by mouth daily. Calcium chew   Yes Historical Provider, MD  diltiazem (TIAZAC) 180 MG 24 hr capsule Take 1 capsule (180 mg total) by mouth daily. Patient taking differently: Take 180 mg by mouth daily with breakfast.  11/29/15  Yes [name], MD  escitalopram (LEXAPRO) 20 MG tablet Take 1 tablet (20 mg total) by mouth daily. Patient taking differently: Take 20 mg by mouth daily before breakfast.  05/15/16  Yes Wardell Honour, MD  esomeprazole (NEXIUM) 40 MG capsule Take 1 capsule (40 mg total) by mouth 2 (two) times daily. 05/09/16  Yes Ladene Artist, MD  ferrous sulfate 325 (65 FE) MG tablet Take 325 mg by mouth daily with breakfast.   Yes Historical Provider, MD  folic acid (FOLVITE) 1 MG tablet Take 2 tablets (2 mg total) by mouth daily. 11/14/15  Yes [name], MD  Ibuprofen-Diphenhydramine Cit (ADVIL PM PO) Take 2 tablets by mouth at bedtime.   Yes Historical Provider, MD  Lacosamide (VIMPAT) 150 MG TABS Take 1 tablet (150 mg total) by mouth 2 (two) times daily. 04/30/16  Yes Dennie Bible, NP  Lactase (DAIRY-RELIEF PO) Take 2 tablets by mouth 2 (two) times daily.    Yes Historical Provider, MD  loratadine (CLARITIN) 10 MG tablet Take 10 mg by mouth daily.   Yes Historical Provider, MD  mercaptopurine (PURINETHOL) 50 MG tablet Take 2 tablets (100 mg total) by mouth daily. Give on an empty stomach 1 hour before or 2 hours after meals. Caution: Chemotherapy. 05/09/16  Yes Ladene Artist, MD  mesalamine (LIALDA) 1.2 g EC tablet Take 2 tablets (2.4 g total)  by mouth daily. 05/09/16  Yes Ladene Artist, MD  metFORMIN (GLUCOPHAGE) 500 MG tablet TAKE 1 TABLET EVERY EVENING Patient taking differently: Take 1,000 mg by mouth every evening.  11/14/15  Yes [name], MD  methscopolamine (PAMINE FORTE) 5 MG tablet Take 1 tablet (5 mg total) by mouth 2 (two) times daily. 05/09/16  Yes Ladene Artist, MD  metoprolol tartrate (LOPRESSOR) 25 MG tablet TAKE ONE-HALF (1/2) TABLET  TWICE A DAY 06/12/16  Yes Wardell Honour, MD  potassium chloride SA (K-DUR,KLOR-CON) 20 MEQ tablet Take 1 tablet (20 mEq total) by mouth 2 (two) times daily. 11/14/15  Yes [name], MD  Probiotic Product (PROBIOTIC PO) Take 1 tablet by mouth daily.   Yes Historical Provider, MD  eletriptan (RELPAX) 40 MG tablet Take 1 tablet at onset of headache, may repeat in 2 hours if headache persists or reoccurs. Patient taking differently: Take 40 mg by mouth every 2 (two) hours as needed. Take 1 tablet at onset of headache, may repeat in 2 hours if headache persists or reoccurs. 01/18/13   Wendie Agreste, MD  glucose blood (CHOICE DM FORA G20 TEST STRIPS) test strip Use as instructed 11/13/12   [name], MD  HYDROcodone-acetaminophen (NORCO/VICODIN) 5-325 MG tablet Take 1 tablet by mouth every 6 (six) hours as needed for moderate pain. Patient not taking: Reported on 06/18/2016 05/14/16   Dalia Heading, PA-C  ibuprofen (ADVIL,MOTRIN) 800 MG tablet Take 1 tablet (800 mg total) by mouth every 8 (eight) hours as needed. Patient not taking: Reported on 06/18/2016 05/14/16   Dalia Heading, PA-C  ONE TOUCH ULTRA TEST test strip USE AS INSTRUCTED    Mancel Bale, PA-C  [facility] DELICA LANCETS 16X MISC USE AS INSTRUCTED (NEEDS OFFICE VISIT) 02/04/13   Rise Mu, PA-C     Social History ([name], [name]; 05/29/2016 9:23 AM) Caffeine use  Carbonated beverages. No alcohol use  No drug use  Tobacco use  Never smoker.  Family History [name], [name]; 05/29/2016 9:23 AM) Cerebrovascular Accident  Father. Depression  Sister. Heart Disease  Mother. Hypertension  Father. Migraine Headache  Father.    Physical Exam  The physical exam findings are as follows: Note:General: Overweight male in NAD. Pleasant and cooperative.  HEENT: /AT, no external nasal or ear masses, mucous membranes are moist, no obvious hearing loss  EYES: EOMI, no scleral icterus, pupils normal  CV:  RRR, no murmur, no edema  RESP: Breath sounds equal and clear. Respirations nonlabored.  ABDOMEN: Soft, nontender, nondistended, no masses, no hepatomegaly or splenomegaly, active bowel sounds, no scars, partially reducible umbilical hernia  GU: Very small right inguinal bulge consistent with small hernia. Small left inguinal bulge consistent with small hernia. These are reducible. No palpable testicular masses.  MUSCULOSKELETAL: FROM, good muscle tone, no edema, no venous stasis changes, normal station and gait  SKIN: No jaundice.  NEUROLOGIC: Alert and oriented, answers questions appropriately, normal gait and station.  PSYCHIATRIC: Normal mood, affect , and behavior.    Assessment & Plan Odis Hollingshead MD; 02/27/453 09:81 AM) UMBILICAL HERNIA WITHOUT OBSTRUCTION OR GANGRENE (K42.9) Impression: This is symptomatic. We discussed repair. This would need to be done under general anesthesia.   Plan:  Umbilical hernia repair with mesh. I have discussed the procedure, risks, and aftercare. Risks include but are not limited to bleeding, infection, wound healing problems, anesthesia, recurrence, injury to intra-abdominal organs, cosmetic deformity. He seems to understand and would like to plan.  Sherren Mocha  Zella Richer, M.D.

## 2016-07-01 NOTE — Transfer of Care (Signed)
Immediate Anesthesia Transfer of Care Note  Patient: [name] [name]  Procedure(s) Performed: Procedure(s): UMBILICAL HERNIA REPAIR WITH MESH (N/A) INSERTION OF MESH (N/A)  Patient Location: PACU  Anesthesia Type:General  Level of Consciousness: awake, alert  and oriented  Airway & Oxygen Therapy: Patient Spontanous Breathing and Patient connected to nasal cannula oxygen  Post-op Assessment: Report given to RN, Post -op Vital signs reviewed and stable and Patient moving all extremities X 4  Post vital signs: Reviewed and stable  Last Vitals:  Vitals:   07/01/16 0801 07/01/16 1134  BP: 140/83 104/70  Pulse: 73 78  Resp: 20 13  Temp: 36.7 C 36.4 C    Last Pain:  Vitals:   07/01/16 1134  TempSrc:   PainSc: Asleep         Complications: No apparent anesthesia complications

## 2016-07-02 ENCOUNTER — Encounter (HOSPITAL_COMMUNITY): Payer: Self-pay | Admitting: General Surgery

## 2016-07-02 NOTE — Anesthesia Postprocedure Evaluation (Signed)
Anesthesia Post Note  Patient: [name] [name]  Procedure(s) Performed: Procedure(s) (LRB): UMBILICAL HERNIA REPAIR WITH MESH (N/A) INSERTION OF MESH (N/A)  Patient location during evaluation: PACU Anesthesia Type: General Level of consciousness: awake and alert and oriented Pain management: pain level controlled Vital Signs Assessment: post-procedure vital signs reviewed and stable Respiratory status: nonlabored ventilation, spontaneous breathing and respiratory function stable Cardiovascular status: blood pressure returned to baseline and stable Postop Assessment: no signs of nausea or vomiting Anesthetic complications: no       Last Vitals:  Vitals:   07/01/16 1205 07/01/16 1215  BP: 111/65 115/64  Pulse: 76   Resp: 10   Temp:  36.4 C    Last Pain:  Vitals:   07/01/16 1215  TempSrc:   PainSc: 0-No pain                 Anasia Agro A.

## 2016-07-03 ENCOUNTER — Ambulatory Visit: Payer: 59 | Admitting: Gastroenterology

## 2016-07-12 ENCOUNTER — Encounter: Payer: Self-pay | Admitting: Nurse Practitioner

## 2016-07-16 DIAGNOSIS — C44629 Squamous cell carcinoma of skin of left upper limb, including shoulder: Secondary | ICD-10-CM | POA: Diagnosis not present

## 2016-07-16 DIAGNOSIS — L821 Other seborrheic keratosis: Secondary | ICD-10-CM | POA: Diagnosis not present

## 2016-07-16 DIAGNOSIS — D225 Melanocytic nevi of trunk: Secondary | ICD-10-CM | POA: Diagnosis not present

## 2016-07-16 DIAGNOSIS — L570 Actinic keratosis: Secondary | ICD-10-CM | POA: Diagnosis not present

## 2016-07-16 DIAGNOSIS — Z85828 Personal history of other malignant neoplasm of skin: Secondary | ICD-10-CM | POA: Diagnosis not present

## 2016-07-16 DIAGNOSIS — B078 Other viral warts: Secondary | ICD-10-CM | POA: Diagnosis not present

## 2016-07-18 ENCOUNTER — Other Ambulatory Visit: Payer: Self-pay | Admitting: General Surgery

## 2016-07-18 DIAGNOSIS — R1900 Intra-abdominal and pelvic swelling, mass and lump, unspecified site: Secondary | ICD-10-CM

## 2016-07-22 ENCOUNTER — Ambulatory Visit
Admission: RE | Admit: 2016-07-22 | Discharge: 2016-07-22 | Disposition: A | Discharge status: Home / Self Care | Payer: 59 | Source: Ambulatory Visit | Attending: General Surgery | Admitting: General Surgery

## 2016-07-22 DIAGNOSIS — K7460 Unspecified cirrhosis of liver: Secondary | ICD-10-CM | POA: Diagnosis not present

## 2016-07-22 DIAGNOSIS — R1900 Intra-abdominal and pelvic swelling, mass and lump, unspecified site: Secondary | ICD-10-CM

## 2016-07-23 DIAGNOSIS — G4733 Obstructive sleep apnea (adult) (pediatric): Secondary | ICD-10-CM | POA: Diagnosis not present

## 2016-08-08 DIAGNOSIS — Z0271 Encounter for disability determination: Secondary | ICD-10-CM

## 2016-08-09 ENCOUNTER — Other Ambulatory Visit: Payer: Self-pay

## 2016-08-09 MED ORDER — DILTIAZEM HCL ER BEADS 180 MG PO CP24: 180.0000 mg | ORAL_CAPSULE | Freq: Every day | ORAL | 0 refills | Status: DC

## 2016-08-09 MED ORDER — MESALAMINE 1.2 G PO TBEC: 2.4000 g | DELAYED_RELEASE_TABLET | Freq: Every day | ORAL | 1 refills | Status: DC

## 2016-08-09 NOTE — Telephone Encounter (Signed)
Fax req [facility] Diltiazem  Saw Dr. Tamala Julian 04/2016- due back 07/2016 Sent 30 days refill

## 2016-08-20 ENCOUNTER — Encounter: Payer: Self-pay | Admitting: Family Medicine

## 2016-08-20 ENCOUNTER — Ambulatory Visit (INDEPENDENT_AMBULATORY_CARE_PROVIDER_SITE_OTHER): Payer: 59 | Admitting: Family Medicine

## 2016-08-20 VITALS — BP 120/78 | HR 73 | Temp 98.6°F | Resp 16 | Ht 70.5 in | Wt 219.4 lb

## 2016-08-20 DIAGNOSIS — R42 Dizziness and giddiness: Secondary | ICD-10-CM | POA: Diagnosis not present

## 2016-08-20 DIAGNOSIS — K219 Gastro-esophageal reflux disease without esophagitis: Secondary | ICD-10-CM | POA: Diagnosis not present

## 2016-08-20 DIAGNOSIS — E876 Hypokalemia: Secondary | ICD-10-CM | POA: Diagnosis not present

## 2016-08-20 DIAGNOSIS — Z9989 Dependence on other enabling machines and devices: Secondary | ICD-10-CM

## 2016-08-20 DIAGNOSIS — E119 Type 2 diabetes mellitus without complications: Secondary | ICD-10-CM | POA: Diagnosis not present

## 2016-08-20 DIAGNOSIS — R55 Syncope and collapse: Secondary | ICD-10-CM

## 2016-08-20 DIAGNOSIS — I10 Essential (primary) hypertension: Secondary | ICD-10-CM

## 2016-08-20 DIAGNOSIS — G4733 Obstructive sleep apnea (adult) (pediatric): Secondary | ICD-10-CM

## 2016-08-20 DIAGNOSIS — G40209 Localization-related (focal) (partial) symptomatic epilepsy and epileptic syndromes with complex partial seizures, not intractable, without status epilepticus: Secondary | ICD-10-CM | POA: Diagnosis not present

## 2016-08-20 DIAGNOSIS — F5105 Insomnia due to other mental disorder: Secondary | ICD-10-CM | POA: Diagnosis not present

## 2016-08-20 DIAGNOSIS — R569 Unspecified convulsions: Secondary | ICD-10-CM | POA: Diagnosis not present

## 2016-08-20 DIAGNOSIS — K5080 Crohn's disease of both small and large intestine without complications: Secondary | ICD-10-CM | POA: Diagnosis not present

## 2016-08-20 DIAGNOSIS — F321 Major depressive disorder, single episode, moderate: Secondary | ICD-10-CM

## 2016-08-20 DIAGNOSIS — F99 Mental disorder, not otherwise specified: Secondary | ICD-10-CM

## 2016-08-20 LAB — POCT URINALYSIS DIP (MANUAL ENTRY)
GLUCOSE UA: NEGATIVE
Nitrite, UA: NEGATIVE
PH UA: 5.5
Protein Ur, POC: 30 — AB
UROBILINOGEN UA: 1

## 2016-08-20 MED ORDER — POTASSIUM CHLORIDE CRYS ER 20 MEQ PO TBCR
20.0000 meq | EXTENDED_RELEASE_TABLET | Freq: Two times a day (BID) | ORAL | 3 refills | Status: DC
Start: 2016-08-20 — End: 2017-08-27

## 2016-08-20 MED ORDER — SERTRALINE HCL 50 MG PO TABS: 50.0000 mg | ORAL_TABLET | Freq: Every day | ORAL | 3 refills | Status: DC

## 2016-08-20 MED ORDER — ATORVASTATIN CALCIUM 10 MG PO TABS: 10.0000 mg | ORAL_TABLET | Freq: Every day | ORAL | 3 refills | Status: DC

## 2016-08-20 MED ORDER — TRAZODONE HCL 50 MG PO TABS: 50.0000 mg | ORAL_TABLET | Freq: Every evening | ORAL | 5 refills | Status: DC | PRN

## 2016-08-20 NOTE — Progress Notes (Signed)
Subjective:    Patient ID: [name] [name], male    DOB: 03-28-1961, 56 y.o.   MRN: [id number]  08/20/2016  Diabetes (depression scale, score 25)   HPI This 56 y.o. male presents for evaluation of DMII, depression, seizure disorder.  Syncope: passing out a lot more.  Increase in seizure activity as well.  Dr. Krista Blue states that appears different.  Associated in some capacity.  Has been falling a lot. Dizzy all the time.    Umbilical hernia: s/p surgery by [facility].  After surgery, had syncopal event in shower.  Mesh is fine yet seroma developed. S/p follow-up with [facility]; ripped; has large seroma; looks pregnant.  Cirrhosis: detected on CT post-operatively. Not a drinker and never has been.  Atherosclerosis: detected on CT post-operatively also; concerned with significance.  No previous statin therapy.   Depression/Anxiety/Insomnia: not sleeping at all.  Depression is getting worse.  SI yet no plan; would not carry out due to effects on wife.  Increased Lexapro to 60m daily at last visit without improvement.  Sees therapist every 2 weeks.  No other medications in past.  Sister bipolar; brother alcoholic and drug addict.    Dropping everything: tactile sensation decreased; dropping a lot of things.  Discussed Dr. YKrista Blue   Can still lift 50 pounds yet holding coffee cup is a struggle.  Drops small items.   Both hands involved.   Review of Systems  Constitutional: Negative for activity change, appetite change, chills, diaphoresis, fatigue and fever.  Respiratory: Negative for cough and shortness of breath.   Cardiovascular: Negative for chest pain, palpitations and leg swelling.  Gastrointestinal: Negative for abdominal pain, diarrhea, nausea and vomiting.  Endocrine: Negative for cold intolerance, heat intolerance, polydipsia, polyphagia and polyuria.  Skin: Negative for color change, rash and wound.  Neurological: Positive for syncope and weakness. Negative for dizziness, tremors,  seizures, facial asymmetry, speech difficulty, light-headedness, numbness and headaches.  Psychiatric/Behavioral: Positive for dysphoric mood and sleep disturbance. The patient is not nervous/anxious.     Past Medical History:  Diagnosis Date  . Adenomatous colon polyp 10/1999  . Anxiety   . Arthritis    neck, yoga helps.  . Cancer ([facility]    skin  . Cataract   . Complex partial seizure ([facility]   . Crohn's disease of small and large intestines ([facility] 1999  . Depression   . Diabetes mellitus   . Esophageal stricture   . External hemorrhoids   . GERD (gastroesophageal reflux disease)   . History of kidney stones    3 large stones still present  . History of nuclear stress test 11/15/2010   exercise myoview; normal pattern of perfusion; normal, low risk study   . Hyperlipemia   . Hypertension   . Migraines   . MRSA (methicillin resistant Staphylococcus aureus) 10/2010   . Osteoporosis   . Pneumonia   . PONV (postoperative nausea and vomiting)   . Seizures ([facility]   . Skin cancer    squamous cell multiple; [facility]; followed every 3 months.  . Sleep apnea    CPAP machine, uses nightly   Past Surgical History:  Procedure Laterality Date  . CATARACT EXTRACTION Bilateral   . ELBOW SURGERY  2012   elbow MRSA infection   . EYE SURGERY     cataracts removed, /w IOL  . INSERTION OF MESH N/A 07/01/2016   Procedure: INSERTION OF MESH;  Surgeon: TJackolyn Confer MD;  [facility]  Service: General;  Laterality: N/A;  .  SHOULDER SURGERY    . SINUS SURGERY WITH INSTATRAK    . TRANSTHORACIC ECHOCARDIOGRAM  02/22/2011   EF 55-65%; increased pattern of LVH with mild conc hypertrophy, abnormal relaxation & increased filling pressure (grade 2 diastolic dysfunction); atrial septum thickened (lipomatous hypertrophy)  . UMBILICAL HERNIA REPAIR N/A 07/01/2016   Procedure: UMBILICAL HERNIA REPAIR WITH MESH;  Surgeon: [name], MD;  Location: [facility];  Service: General;  Laterality: N/A;  .  VASECTOMY     Allergies  Allergen Reactions  . Penicillins Hives     Has patient had a PCN reaction causing immediate rash, facial/tongue/throat swelling, SOB or lightheadedness with hypotension: # # # YES # # #  Has patient had a PCN reaction causing severe rash involving mucus membranes or skin necrosis: # # UNKNOWN # #  Has patient had a PCN reaction that required hospitalization  . # # NO # #  Has patient had a PCN reaction occurring within the last 10 years:  # # NO # #  If all of the above answers are "NO", then may proceed with Cephalosporin use.   . Sulfonamide Derivatives Hives  . Azithromycin Swelling    SWELLING REACTION UNSPECIFIED   . Beef-Derived Products     RED MEAT > UNSPECIFIED REACTION   . Milk-Related Compounds     DAIRY >UNSPECIFIED REACTION   . Dilaudid [Hydromorphone Hcl] Nausea And Vomiting    Social History   Social History  . Marital status: Married    Spouse name: [name]  . Number of children: 2  . Years of education: college   Occupational History  . disabled     seizure disorder with memory impairment   Social History Main Topics  . Smoking status: Never Smoker  . Smokeless tobacco: Never Used  . Alcohol use No  . Drug use: No  . Sexual activity: Yes    Birth control/ protection: Surgical     Comment: 1 partner in last 12 months   Other Topics Concern  . Not on file   Social History Narrative   Marital status: married x 32 years;       Lives:   lives at home with his family.      Children:  two children; no grandchildren.  Son is special needs at age 32yo; Cornelian Delaine with TOF.       Employment: unemployed; [place] [employer] long term disability      Tobacco: none      Alcohol: none      Drugs: none      Exercise: unable to exercise due to frequent falls   Patient has a college education.   Patient is right-handed.   Patient drinks one cup of soda daily.   Family History  Problem Relation Age of Onset  . Colon polyps Father     . Stroke Father   . Hyperlipidemia Father   . Hypertension Father   . Heart disease Mother     CABG at age 25  . Hyperlipidemia Mother   . Hypertension Mother   . Stroke Paternal Grandmother   . Stroke Paternal Grandfather   . Other Child     tetrology of fallot (cornealia deland syndrome)  . Colon cancer Neg Hx        Objective:    BP 120/78   Pulse 73   Temp 98.6 F (37 C) (Oral)   Resp 16   Ht 5' 10.5" (1.791 m)   Wt 219 lb 6.4  oz (99.5 kg)   SpO2 98%   BMI 31.04 kg/m  Physical Exam  Constitutional: He is oriented to person, place, and time. He appears well-developed and well-nourished. No distress.  HENT:  Head: Normocephalic and atraumatic.  Right Ear: External ear normal.  Left Ear: External ear normal.  Nose: Nose normal.  Mouth/Throat: Oropharynx is clear and moist.  Eyes: Conjunctivae and EOM are normal. Pupils are equal, round, and reactive to light.  Neck: Normal range of motion. Neck supple. Carotid bruit is not present. No thyromegaly present.  Cardiovascular: Normal rate, regular rhythm, normal heart sounds and intact distal pulses.  Exam reveals no gallop and no friction rub.   No murmur heard. Pulmonary/Chest: Effort normal and breath sounds normal. He has no wheezes. He has no rales.  Abdominal: Soft. Bowel sounds are normal. He exhibits no distension and no mass. There is no tenderness. There is no rebound and no guarding.  Lymphadenopathy:    He has no cervical adenopathy.  Neurological: He is alert and oriented to person, place, and time. He displays normal reflexes. No cranial nerve deficit. He exhibits normal muscle tone. Coordination normal.  Skin: Skin is warm and dry. No rash noted. He is not diaphoretic.  Psychiatric: He has a normal mood and affect. His behavior is normal. Judgment and thought content normal.  Nursing note and vitals reviewed.   Depression screen [facility] 2/9 08/20/2016 05/15/2016 06/03/2015 02/16/2015 10/06/2014  Decreased  Interest 3 2 0 0 0  Down, Depressed, Hopeless 3 3 0 0 0  PHQ - 2 Score 6 5 0 0 0  Altered sleeping 3 3 - - -  Tired, decreased energy 1 1 - - -  Change in appetite 3 2 - - -  Feeling bad or failure about yourself  3 3 - - -  Trouble concentrating 3 3 - - -  Moving slowly or fidgety/restless 3 3 - - -  Suicidal thoughts 3 3 - - -  PHQ-9 Score 25 23 - - -  Difficult doing work/chores Very difficult - - - -  Some recent data might be hidden   Fall Risk  08/20/2016 05/15/2016 11/14/2015 10/06/2014 07/07/2014  Falls in the past year? No Yes Yes No Yes  Number falls in past yr: - 2 or more 2 or more - 2 or more  Injury with Fall? - Yes No - -       Assessment & Plan:   1. Essential hypertension   2. OSA on CPAP   3. Gastroesophageal reflux disease without esophagitis   4. Crohn's disease of both small and large intestine without complication ([place])   5. Type 2 diabetes mellitus without complication, without long-term current use of insulin (H[facility])   6. Partial symptomatic epilepsy with complex partial seizures, not intractable, without status epilepticus ([place])   7. Moderate single current episode of major depressive disorder ([place])   8. Nonepileptic episode ([place])   9. Insomnia due to other mental disorder   10. Hypokalemia   11. Syncope, unspecified syncope type   12. Dizziness    -worsening dizziness with reported syncope that appears separate from seizure disorder; obtain EKG; obtain labs; refer to cardiology for full evaluation.  To ED for recurrent syncope. -for insomnia, rx for Trazodone provided; pt feels that if sleep is restored, then mood with improve and fully agree. -wean Lexapro and start Zoloft therapy.   -due to CT findings of atherosclerosis, start statin therapy; also ADA guidelines recommend statin therapy  for all diabetes > 29 years old. -CT revealed nonalcoholic fatty liver; likely due to obesity; monitor closely. -prolonged face to face for 45 minutes with greater than  50% of time dedicated to counseling and coordination of care.   Orders Placed This Encounter  Procedures  . CBC with Differential/Platelet  . Comprehensive metabolic panel    Order Specific Question:   Has the patient fasted?    Answer:   Yes  . Lipid panel    Order Specific Question:   Has the patient fasted?    Answer:   Yes  . TSH  . Hemoglobin A1c  . Ambulatory referral to Cardiology    Referral Priority:   Routine    Referral Type:   Consultation    Referral Reason:   Specialty Services Required    Requested Specialty:   Cardiology    Number of Visits Requested:   1  . POCT urinalysis dipstick  . HM Diabetes Foot Exam   Meds ordered this encounter  Medications  . DISCONTD: traZODone (DESYREL) 50 MG tablet    Sig: Take 1-2 tablets (50-100 mg total) by mouth at bedtime as needed for sleep.    Dispense:  60 tablet    Refill:  5  . traZODone (DESYREL) 50 MG tablet    Sig: Take 1-2 tablets (50-100 mg total) by mouth at bedtime as needed for sleep.    Dispense:  60 tablet    Refill:  5  . sertraline (ZOLOFT) 50 MG tablet    Sig: Take 1 tablet (50 mg total) by mouth daily.    Dispense:  30 tablet    Refill:  3  . atorvastatin (LIPITOR) 10 MG tablet    Sig: Take 1 tablet (10 mg total) by mouth daily.    Dispense:  30 tablet    Refill:  3  . potassium chloride SA (K-DUR,KLOR-CON) 20 MEQ tablet    Sig: Take 1 tablet (20 mEq total) by mouth 2 (two) times daily.    Dispense:  180 tablet    Refill:  3    Return in about 3 months (around 11/17/2016) for recheck.   Garrison Michie Elayne Guerin, M.D. Primary Care at [facility] [address] [place], [place]  [address] [phone] phone [phone] fax

## 2016-08-20 NOTE — Patient Instructions (Addendum)
   1. Trazodone 56m 1-2 tablets at bedtime for sleep. 2.  In two weeks, start Sertraline 568mone tablet at bedtime for depression. Decrease Lexapro 2074mo 1/2 tablet daily for one week and then 1/2 tablet every other day for one week and then stop. 3.  In four weeks, start Atorvastatin 9m39me tablet at bedtime.   IF you received an x-ray today, you will receive an invoice from [facility]. Please contact [facility] at [phone] questions or concerns regarding your invoice.   IF you received labwork today, you will receive an invoice from [facility] contact [facility] at [phone] questions or concerns regarding your invoice.   Our billing staff will not be able to assist you with questions regarding bills from these companies.  You will be contacted with the lab results as soon as they are available. The fastest way to get your results is to activate your My Chart account. Instructions are located on the last page of this paperwork. If you have not heard from us [place] the results in 2 weeks, please contact this office.

## 2016-08-21 LAB — CBC WITH DIFFERENTIAL/PLATELET
BASOS ABS: 0.1 10*3/uL (ref 0.0–0.2)
BASOS: 1 %
EOS (ABSOLUTE): 0.3 10*3/uL (ref 0.0–0.4)
Eos: 4 %
Hematocrit: 37.8 % (ref 37.5–51.0)
Hemoglobin: 13.5 g/dL (ref 13.0–17.7)
IMMATURE GRANS (ABS): 0.1 10*3/uL (ref 0.0–0.1)
Immature Granulocytes: 1 %
LYMPHS: 16 %
Lymphocytes Absolute: 1.2 10*3/uL (ref 0.7–3.1)
MCH: 37.2 pg — AB (ref 26.6–33.0)
MCHC: 35.7 g/dL (ref 31.5–35.7)
MCV: 104 fL — AB (ref 79–97)
MONOS ABS: 0.6 10*3/uL (ref 0.1–0.9)
Monocytes: 9 %
NEUTROS ABS: 5.2 10*3/uL (ref 1.4–7.0)
NEUTROS PCT: 69 %
PLATELETS: 188 10*3/uL (ref 150–379)
RBC: 3.63 x10E6/uL — ABNORMAL LOW (ref 4.14–5.80)
RDW: 15.8 % — AB (ref 12.3–15.4)
WBC: 7.5 10*3/uL (ref 3.4–10.8)

## 2016-08-21 LAB — COMPREHENSIVE METABOLIC PANEL
A/G RATIO: 1.5 (ref 1.2–2.2)
ALK PHOS: 116 IU/L (ref 39–117)
ALT: 23 IU/L (ref 0–44)
AST: 29 IU/L (ref 0–40)
Albumin: 4.3 g/dL (ref 3.5–5.5)
BILIRUBIN TOTAL: 2.6 mg/dL — AB (ref 0.0–1.2)
BUN/Creatinine Ratio: 18 (ref 9–20)
BUN: 19 mg/dL (ref 6–24)
CHLORIDE: 104 mmol/L (ref 96–106)
CO2: 18 mmol/L (ref 18–29)
Calcium: 9.4 mg/dL (ref 8.7–10.2)
Creatinine, Ser: 1.06 mg/dL (ref 0.76–1.27)
GFR calc non Af Amer: 79 mL/min/{1.73_m2} (ref >59)
GFR, EST AFRICAN AMERICAN: 92 mL/min/{1.73_m2} (ref >59)
Globulin, Total: 2.8 g/dL (ref 1.5–4.5)
Glucose: 156 mg/dL — ABNORMAL HIGH (ref 65–99)
POTASSIUM: 4.7 mmol/L (ref 3.5–5.2)
Sodium: 142 mmol/L (ref 134–144)
TOTAL PROTEIN: 7.1 g/dL (ref 6.0–8.5)

## 2016-08-21 LAB — LIPID PANEL
CHOLESTEROL TOTAL: 171 mg/dL (ref 100–199)
Chol/HDL Ratio: 3.5 ratio units (ref 0.0–5.0)
HDL: 49 mg/dL (ref >39)
LDL Calculated: 91 mg/dL (ref 0–99)
TRIGLYCERIDES: 153 mg/dL — AB (ref 0–149)
VLDL CHOLESTEROL CAL: 31 mg/dL (ref 5–40)

## 2016-08-21 LAB — TSH: TSH: 2.18 u[IU]/mL (ref 0.450–4.500)

## 2016-08-21 LAB — HEMOGLOBIN A1C
Est. average glucose Bld gHb Est-mCnc: 114 mg/dL
Hgb A1c MFr Bld: 5.6 % (ref 4.8–5.6)

## 2016-08-22 ENCOUNTER — Encounter: Payer: Self-pay | Admitting: Family Medicine

## 2016-08-26 MED ORDER — ESZOPICLONE 3 MG PO TABS: 3.0000 mg | ORAL_TABLET | Freq: Every day | ORAL | 5 refills | Status: DC

## 2016-08-26 NOTE — Telephone Encounter (Signed)
Called to pharmacy 

## 2016-08-28 NOTE — Progress Notes (Signed)
Cardiology Office Note    Date:  08/30/2016   ID:  [name] [name], DOB 1961-04-25, MRN [id number]  PCP:  [name] Forts, MD  Cardiologist:  [name] Martinique, MD    History of Present Illness:  [name] [name] is a 56 y.o. male seen at the request of Dr. Tamala Julian for evaluation of dizziness/syncope. He has a history of DM, HTN, seizures, obesity, and OSA. He was seen by Dr. Ellyn Hack in 2012 with chest pain. Myoview study was unremarkable as was CT chest. Echo showed mild LVH with normal systolic function. Valves normal. Seen again in 2014 by Dr. Debara Pickett with an abnormal Ecg. Myoview was repeated and was again normal.  On follow up today he is seen for evaluation of syncope. He has a history of complex partial seizures where he zones out and stares into space. His wife reports that he tends to stay dizzy and off balance. She reports episodes where he "blacks out" and she cannot arouse him. Afterwards he feels blank and isn't good for anything the rest of the day. He has had 3-4 of these episodes in the last 2 months. Denies any chest pain or SOB. He is on CPAP therapy. No palpitations.     Past Medical History:  Diagnosis Date  . Adenomatous colon polyp 10/1999  . Anxiety   . Arthritis    neck, yoga helps.  . Cancer ([place])    skin  . Cataract   . Complex partial seizure ([place])   . Crohn's disease of small and large intestines ([place]) 1999  . Depression   . Diabetes mellitus   . Esophageal stricture   . External hemorrhoids   . GERD (gastroesophageal reflux disease)   . History of kidney stones    3 large stones still present  . History of nuclear stress test 11/15/2010   exercise myoview; normal pattern of perfusion; normal, low risk study   . Hyperlipemia   . Hypertension   . Migraines   . MRSA (methicillin resistant Staphylococcus aureus) 10/2010   . Osteoporosis   . Pneumonia   . PONV (postoperative nausea and vomiting)   . Seizures ([place])   . Skin cancer    squamous cell multiple; [facility];  followed every 3 months.  . Sleep apnea    CPAP machine, uses nightly    Past Surgical History:  Procedure Laterality Date  . CATARACT EXTRACTION Bilateral   . ELBOW SURGERY  2012   elbow MRSA infection   . EYE SURGERY     cataracts removed, /w IOL  . INSERTION OF MESH N/A 07/01/2016   Procedure: INSERTION OF MESH;  Surgeon: [name], MD;  Location: [facility];  Service: General;  Laterality: N/A;  . SHOULDER SURGERY    . SINUS SURGERY WITH INSTATRAK    . TRANSTHORACIC ECHOCARDIOGRAM  02/22/2011   EF 55-65%; increased pattern of LVH with mild conc hypertrophy, abnormal relaxation & increased filling pressure (grade 2 diastolic dysfunction); atrial septum thickened (lipomatous hypertrophy)  . UMBILICAL HERNIA REPAIR N/A 07/01/2016   Procedure: UMBILICAL HERNIA REPAIR WITH MESH;  Surgeon: [name], MD;  Location: [facility];  Service: General;  Laterality: N/A;  . VASECTOMY      Current Medications: Outpatient Medications Prior to Visit  Medication Sig Dispense Refill  . atorvastatin (LIPITOR) 10 MG tablet Take 1 tablet (10 mg total) by mouth daily. 30 tablet 3  . azelastine (ASTELIN) 0.1 % nasal spray Place 2 sprays into both nostrils 2 (two) times daily. Use in each  nostril as directed 90 mL 3  . calcium carbonate (CALCI-CHEW) 1250 (500 Ca) MG chewable tablet Chew 2 tablets by mouth daily.    Marland Kitchen diltiazem (TIAZAC) 180 MG 24 hr capsule Take 1 capsule (180 mg total) by mouth daily with breakfast. 30 capsule 0  . eletriptan (RELPAX) 40 MG tablet Take 1 tablet at onset of headache, may repeat in 2 hours if headache persists or reoccurs. (Patient taking differently: Take 40 mg by mouth every 2 (two) hours as needed. Take 1 tablet at onset of headache, may repeat in 2 hours if headache persists or reoccurs.) 12 tablet 4  . escitalopram (LEXAPRO) 20 MG tablet Take 1 tablet (20 mg total) by mouth daily. (Patient taking differently: Take 20 mg by mouth daily before breakfast. ) 90 tablet 3  .  esomeprazole (NEXIUM) 40 MG capsule Take 1 capsule (40 mg total) by mouth 2 (two) times daily. 180 capsule 1  . Eszopiclone 3 MG TABS Take 1 tablet (3 mg total) by mouth at bedtime. Take immediately before bedtime 30 tablet 5  . ferrous sulfate 325 (65 FE) MG tablet Take 325 mg by mouth daily with breakfast.    . folic acid (FOLVITE) 1 MG tablet Take 2 tablets (2 mg total) by mouth daily. 180 tablet 3  . glucose blood (CHOICE DM FORA G20 TEST STRIPS) test strip Use as instructed 100 each 12  . Ibuprofen-Diphenhydramine Cit (ADVIL PM PO) Take 2 tablets by mouth at bedtime.    . Lacosamide (VIMPAT) 150 MG TABS Take 1 tablet (150 mg total) by mouth 2 (two) times daily. 180 tablet 1  . Lactase (DAIRY-RELIEF PO) Take 2 tablets by mouth 2 (two) times daily.     Marland Kitchen loratadine (CLARITIN) 10 MG tablet Take 10 mg by mouth daily.    . mercaptopurine (PURINETHOL) 50 MG tablet Take 2 tablets (100 mg total) by mouth daily. Give on an empty stomach 1 hour before or 2 hours after meals. Caution: Chemotherapy. 180 tablet 1  . mesalamine (LIALDA) 1.2 g EC tablet Take 2 tablets (2.4 g total) by mouth daily. 180 tablet 1  . metFORMIN (GLUCOPHAGE) 500 MG tablet TAKE 1 TABLET EVERY EVENING (Patient taking differently: Take 1,000 mg by mouth every evening. ) 90 tablet 3  . methscopolamine (PAMINE FORTE) 5 MG tablet Take 1 tablet (5 mg total) by mouth 2 (two) times daily. 180 tablet 1  . ONE TOUCH ULTRA TEST test strip USE AS INSTRUCTED 100 each 2  . ONETOUCH DELICA LANCETS 43P MISC USE AS INSTRUCTED (NEEDS OFFICE VISIT) 100 each 0  . potassium chloride SA (K-DUR,KLOR-CON) 20 MEQ tablet Take 1 tablet (20 mEq total) by mouth 2 (two) times daily. 180 tablet 3  . Probiotic Product (PROBIOTIC PO) Take 1 tablet by mouth daily.    . sertraline (ZOLOFT) 50 MG tablet Take 1 tablet (50 mg total) by mouth daily. 30 tablet 3  . metoprolol tartrate (LOPRESSOR) 25 MG tablet TAKE ONE-HALF (1/2) TABLET TWICE A DAY 90 tablet 0  .  oxyCODONE (OXY IR/ROXICODONE) 5 MG immediate release tablet Take 1-2 tablets (5-10 mg total) by mouth every 4 (four) hours as needed for moderate pain, severe pain or breakthrough pain. (Patient not taking: Reported on 08/30/2016) 40 tablet 0  . traZODone (DESYREL) 50 MG tablet Take 1-2 tablets (50-100 mg total) by mouth at bedtime as needed for sleep. (Patient not taking: Reported on 08/30/2016) 60 tablet 5   Facility-Administered Medications Prior to Visit  Medication Dose  Route Frequency Provider Last Rate Last Dose  . gadopentetate dimeglumine (MAGNEVIST) injection 20 mL  20 mL Intravenous Once PRN Marcial Pacas, MD         Allergies:   Penicillins; Sulfonamide derivatives; Azithromycin; Beef-derived products; Milk-related compounds; and Dilaudid [hydromorphone hcl]   Social History   Social History  . Marital status: Married    Spouse name: [name]  . Number of children: 2  . Years of education: college   Occupational History  . disabled     seizure disorder with memory impairment   Social History Main Topics  . Smoking status: Never Smoker  . Smokeless tobacco: Never Used  . Alcohol use No  . Drug use: No  . Sexual activity: Yes    Birth control/ protection: Surgical     Comment: 1 partner in last 12 months   Other Topics Concern  . None   Social History Narrative   Marital status: married x 32 years;       Lives:   lives at home with his family.      Children:  two children; no grandchildren.  Son is special needs at age 55yo; Cornelian Delaine with TOF.       Employment: unemployed; [employer] long term disability      Tobacco: none      Alcohol: none      Drugs: none      Exercise: unable to exercise due to frequent falls   Patient has a college education.   Patient is right-handed.   Patient drinks one cup of soda daily.     Family History:  The patient's family history includes Colon polyps in his father; Heart disease in his mother; Hyperlipidemia in his father  and mother; Hypertension in his father and mother; Other in his child; Stroke in his father, paternal grandfather, and paternal grandmother.   ROS:   Please see the history of present illness.    ROS All other systems reviewed and are negative.   PHYSICAL EXAM:   VS:  BP 98/60 (BP Location: Left Arm, Patient Position: Sitting, Cuff Size: Normal)   Ht 6' (1.829 m)   Wt 223 lb (101.2 kg)   BMI 30.24 kg/m     Orthostatic vitals: Lying BP 110/60 pulse 72,  Sitting BP 106/70, pulse 80 Standing BP 98/60 pulse 80.   GEN: Well nourished, well developed, in no acute distress  HEENT: normal  Neck: no JVD, carotid bruits, or masses Cardiac: RRR; no murmurs, rubs, or gallops,no edema  Respiratory:  clear to auscultation bilaterally, normal work of breathing GI: soft, nontender, nondistended, + BS MS: no deformity or atrophy  Skin: warm and dry, no rash Neuro:  Alert and Oriented x 3, Strength and sensation are intact Psych: euthymic mood, full affect  Wt Readings from Last 3 Encounters:  08/30/16 223 lb (101.2 kg)  08/20/16 219 lb 6.4 oz (99.5 kg)  07/01/16 226 lb (102.5 kg)      Studies/Labs Reviewed:   EKG:  EKG is ordered today.  The ekg ordered today demonstrates NSR rate 75. Normal Ecg. I have personally reviewed and interpreted this study.   Recent Labs: 06/27/2016: Hemoglobin 13.1 08/20/2016: ALT 23; BUN 19; Creatinine, Ser 1.06; Platelets 188; Potassium 4.7; Sodium 142; TSH 2.180   Lipid Panel    Component Value Date/Time   CHOL 171 08/20/2016 1150   TRIG 153 (H) 08/20/2016 1150   HDL 49 08/20/2016 1150   CHOLHDL 3.5 08/20/2016 1150  CHOLHDL 3.5 05/15/2016 1005   VLDL 33 (H) 05/15/2016 1005   LDLCALC 91 08/20/2016 1150    Additional studies/ records that were reviewed today include:  Myoview 03/17/13: Cardiology Nuclear Med Study  YANIS LARIN is a 56 y.o. male     MRN : [id number]     DOB: [birth year]  Procedure Date: 03/17/2013  Nuclear Med  Background Indication for Stress Test:  Evaluation for Ischemia and Abnormal EKG History:  Asthma and ASTMA IS EXERCISE INDUCED Cardiac Risk Factors: Family History - CAD, Hypertension, IDDM Type 2, Lipids, Overweight and TACHYCARDIA  Symptoms:  Chest Pain, Dizziness, DOE, Fatigue, Light-Headedness, Palpitations and SOB   Nuclear Pre-Procedure Caffeine/Decaff Intake:  1:00am NPO After: 11AM   IV Site: R Hand  IV 0.9% NS with Angio Cath:  22g  Chest Size (in):  44" IV Started by: Azucena Cecil, RN  Height: 5' 11"  (1.803 m)  Cup Size: n/a  BMI:  Body mass index is 34.33 kg/(m^2). Weight:  246 lb (111.585 kg)   Tech Comments:  N/A    Nuclear Med Study 1 or 2 day study: 1 day  Stress Test Type:  [place] Provider:  Lyman Bishop, MD   Resting Radionuclide: Technetium 66mSestamibi  Resting Radionuclide Dose: 10.3 mCi   Stress Radionuclide:  Technetium 952mestamibi  Stress Radionuclide Dose: 30.6 mCi           Stress Protocol Rest HR: 85 Stress HR:101  Rest BP: 123/74 Stress BP: 115/77  Exercise Time (min): n/a METS: n/a    Dose of Adenosine (mg):  n/a Dose of Lexiscan: 0.4 mg  Dose of Atropine (mg): n/a Dose of Dobutamine: n/a mcg/kg/min (at max HR)  Stress Test Technologist: GwMellody MemosCCT Nuclear Technologist: RoOtho PerlCNMT   Rest Procedure:  Myocardial perfusion imaging was performed at rest 45 minutes following the intravenous administration of Technetium 9976mstamibi. Stress Procedure:  The patient received IV Lexiscan 0.4 mg over 15-seconds.  Technetium 32m72mtamibi injected at 30-seconds.  There were no significant changes with Lexiscan.  Quantitative spect images were obtained after a 45 minute delay.  Transient Ischemic Dilatation (Normal <1.22):  1.00 Lung/Heart Ratio (Normal <0.45):  0.35 QGS EDV:  106 ml QGS ESV:  36 ml LV Ejection Fraction: 66%     Rest ECG: NSR - Normal EKG  Stress ECG: No significant change  from baseline ECG  QPS Raw Data Images:  Normal; no motion artifact; normal heart/lung ratio. Stress Images:  Normal homogeneous uptake in all areas of the myocardium. Rest Images:  Normal homogeneous uptake in all areas of the myocardium. Subtraction (SDS):  No evidence of ischemia.  Impression Exercise Capacity:  Lexiscan with no exercise. BP Response:  Normal blood pressure response. Clinical Symptoms:  No significant symptoms noted. ECG Impression:  No significant ST segment change suggestive of ischemia. Comparison with Prior Nuclear Study: No significant change from previous study  Overall Impression:  Normal stress nuclear study.  LV Wall Motion:  NL LV Function; NL Wall Motion   CROITORU,MIHAI, MD  03/17/2013 5:30 PM  Echo 09/25/11: Study Conclusions  - Left ventricle: The cavity size was normal. Wall thickness was increased in a pattern of mild LVH. Systolic function was normal. The estimated ejection fraction was in the range of 60% to 65%. Wall motion was normal; there were no regional wall motion abnormalities. Doppler parameters are consistent with abnormal left ventricular relaxation (grade 1 diastolic dysfunction). - Aortic valve: There was no  stenosis. - Aorta: Aortic root diameter: 66m. Mildly dilated aortic root. - Mitral valve: No significant regurgitation. - Right ventricle: The cavity size was normal. Systolic function was normal. - Pulmonary arteries: No complete TR doppler jet so unable to estimate PA systolic pressure. - Inferior vena cava: The vessel was normal in size; the respirophasic diameter changes were in the normal range (= 50%); findings are consistent with normal central venous pressure. Impressions:  - Normal LV size with mild LV hypertrophy, EF 60-65%. Normal RV size and systolic function. No significant valvular abnormalities. Mildly dilated aortic root.   ASSESSMENT:    1. Essential  hypertension   2. Syncope, unspecified syncope type      PLAN:  In order of problems listed above:  1. By history his syncope is most likely neurologic in etiology with posttctal state. He is mildly orthostatic. Prior cardiac evaluation as noted above. Recommend he stop metoprolol. Continue diltiazem. Will have him wear an event monitor to try and correlate rhythm with symptoms. If normal no further cardiac evaluation needed.     Medication Adjustments/Labs and Tests Ordered: Current medicines are reviewed at length with the patient today.  Concerns regarding medicines are outlined above.  Medication changes, Labs and Tests ordered today are listed in the Patient Instructions below. Patient Instructions  Stop taking metoprolol  We will  Have you wear a monitor for 30 days.      Signed, [name] JMartinique MD  08/30/2016 1:52 PM    [id number] [place] [phone]

## 2016-08-30 ENCOUNTER — Encounter: Payer: Self-pay | Admitting: Cardiology

## 2016-08-30 ENCOUNTER — Ambulatory Visit (INDEPENDENT_AMBULATORY_CARE_PROVIDER_SITE_OTHER): Payer: 59 | Admitting: Cardiology

## 2016-08-30 VITALS — BP 98/60 | Ht 72.0 in | Wt 223.0 lb

## 2016-08-30 DIAGNOSIS — I10 Essential (primary) hypertension: Secondary | ICD-10-CM

## 2016-08-30 DIAGNOSIS — R55 Syncope and collapse: Secondary | ICD-10-CM | POA: Insufficient documentation

## 2016-08-30 NOTE — Patient Instructions (Signed)
Stop taking metoprolol  We will  Have you wear a monitor for 30 days.

## 2016-09-04 ENCOUNTER — Encounter: Payer: Self-pay | Admitting: Family Medicine

## 2016-09-09 ENCOUNTER — Encounter: Payer: Self-pay | Admitting: Family Medicine

## 2016-09-10 ENCOUNTER — Ambulatory Visit (INDEPENDENT_AMBULATORY_CARE_PROVIDER_SITE_OTHER): Payer: 59

## 2016-09-10 DIAGNOSIS — R55 Syncope and collapse: Secondary | ICD-10-CM

## 2016-09-13 ENCOUNTER — Other Ambulatory Visit: Payer: Self-pay | Admitting: Family Medicine

## 2016-09-13 DIAGNOSIS — E876 Hypokalemia: Secondary | ICD-10-CM

## 2016-09-13 NOTE — Telephone Encounter (Signed)
07/2016 last ov and lab

## 2016-09-17 ENCOUNTER — Telehealth: Payer: Self-pay | Admitting: Cardiology

## 2016-09-17 NOTE — Telephone Encounter (Signed)
Agree  Melvie Paglia MD, FACC   

## 2016-09-17 NOTE — Telephone Encounter (Signed)
Returned call to [employer]  Manual report/patient pressed symptom button EKG stable NSR w lead loss They report that the urgent notification was due to patient selecting "quite a few symptoms" including syncope. [employer] rep informs me they tried to reach out to patient but were unable to get hold of him on phone.  I also attempted, but was unable to reach patient at home or cell - I have left VM for him to call.

## 2016-09-17 NOTE — Telephone Encounter (Signed)
New Message   Crystal with [employer] received a critical notification pertaining to patients event monitor & was instructed to call & ask for triage

## 2016-09-17 NOTE — Telephone Encounter (Signed)
Spoke to patient. He voiced that he recorded symptoms, but that all were baseline for him r/t initial complaint when seen by Dr. Martinique.   Notes his hx of seizures, syncope, lightheadedness, dizziness. Denies acute events. Denies SOB, chest pain, palpitations.  -- he describes his lightheadedness and dizziness as "ongoing/all the time"  I advised patient that he may record symptoms as he chooses, but I explained that the symptoms are timestamped to try to associate w specific rhythm events, so probably most beneficial to just record in instance of acute events.  Also, he states that his HR used to be 60s to 70s, has been up since discontinuation of metoprolol. Has been 90s to low 100s when checked.  Pt asking for advice on whether to resume this med. Aware I'll defer to Dr. Martinique for recommendations.

## 2016-09-18 NOTE — Telephone Encounter (Signed)
Pt advised on continuation of current regimen, no need to resume metoprolol at this time per MD  Will continue monitor wear and call if new concerns.

## 2016-09-18 NOTE — Telephone Encounter (Signed)
We'll wait until his event monitor results are in.  Peter Martinique MD, [facility]

## 2016-09-20 ENCOUNTER — Telehealth: Payer: Self-pay | Admitting: Student

## 2016-09-20 NOTE — Telephone Encounter (Signed)
   Received a call from [employer] regarding the patient having a manual trigger of his event monitor earlier this evening. He noted dizziness, syncope, and seizure-like activity. Event monitor at that time showed NSR with HR in the 70's and no abnormal events. [employer] contacted the patient and he told them the episode lasted approximately 5 minutes. They recommended he go to the ER for further evaluation but he declined.   I attempted to contact the patient but was unable to reach him (left voicemail). Will forward to Triage at [facility] so we can follow-up on Monday. Needs to be seen by Neurology.   Signed, Erma Heritage, PA-C 09/20/2016, 7:15 PM

## 2016-09-23 NOTE — Telephone Encounter (Addendum)
Contacted patient's wife & informed her of monitor results at time of symptom report and PA advice. She states patient has h/o complex seizures (ongoing issues) and has a neuro appt in the next few weeks.

## 2016-09-24 NOTE — Telephone Encounter (Signed)
See notes from [facility] for triage call. Pt's neuro f/u appt was moved up.

## 2016-09-28 ENCOUNTER — Other Ambulatory Visit: Payer: Self-pay | Admitting: Gastroenterology

## 2016-10-08 ENCOUNTER — Encounter: Payer: Self-pay | Admitting: Gastroenterology

## 2016-10-08 DIAGNOSIS — Z85828 Personal history of other malignant neoplasm of skin: Secondary | ICD-10-CM | POA: Diagnosis not present

## 2016-10-08 DIAGNOSIS — C44729 Squamous cell carcinoma of skin of left lower limb, including hip: Secondary | ICD-10-CM | POA: Diagnosis not present

## 2016-10-08 DIAGNOSIS — C44629 Squamous cell carcinoma of skin of left upper limb, including shoulder: Secondary | ICD-10-CM | POA: Diagnosis not present

## 2016-10-08 DIAGNOSIS — L570 Actinic keratosis: Secondary | ICD-10-CM | POA: Diagnosis not present

## 2016-10-08 DIAGNOSIS — L821 Other seborrheic keratosis: Secondary | ICD-10-CM | POA: Diagnosis not present

## 2016-10-08 DIAGNOSIS — D225 Melanocytic nevi of trunk: Secondary | ICD-10-CM | POA: Diagnosis not present

## 2016-10-20 ENCOUNTER — Other Ambulatory Visit: Payer: Self-pay | Admitting: Family Medicine

## 2016-10-22 ENCOUNTER — Encounter: Payer: Self-pay | Admitting: Psychology

## 2016-10-22 ENCOUNTER — Ambulatory Visit (INDEPENDENT_AMBULATORY_CARE_PROVIDER_SITE_OTHER): Payer: 59 | Admitting: Nurse Practitioner

## 2016-10-22 ENCOUNTER — Encounter: Payer: Self-pay | Admitting: Nurse Practitioner

## 2016-10-22 ENCOUNTER — Other Ambulatory Visit: Payer: Self-pay | Admitting: *Deleted

## 2016-10-22 VITALS — BP 136/86 | HR 107 | Ht 72.0 in | Wt 221.6 lb

## 2016-10-22 DIAGNOSIS — F321 Major depressive disorder, single episode, moderate: Secondary | ICD-10-CM | POA: Diagnosis not present

## 2016-10-22 DIAGNOSIS — G40209 Localization-related (focal) (partial) symptomatic epilepsy and epileptic syndromes with complex partial seizures, not intractable, without status epilepticus: Secondary | ICD-10-CM | POA: Diagnosis not present

## 2016-10-22 DIAGNOSIS — R413 Other amnesia: Secondary | ICD-10-CM

## 2016-10-22 DIAGNOSIS — R569 Unspecified convulsions: Secondary | ICD-10-CM

## 2016-10-22 MED ORDER — LACOSAMIDE 150 MG PO TABS: 150.0000 mg | ORAL_TABLET | Freq: Two times a day (BID) | ORAL | 1 refills | Status: DC

## 2016-10-22 MED ORDER — DIVALPROEX SODIUM ER 500 MG PO TB24: 500.0000 mg | ORAL_TABLET | Freq: Two times a day (BID) | ORAL | 4 refills | Status: DC

## 2016-10-22 NOTE — Patient Instructions (Signed)
Continue  Vimpat 150 mg twice daily Add on Depakote 516m ER twice daily Continue seeing a counselor Continue Zoloft at current dose  Will set up for neuropsych evaluation F/U in 4 months follow up appt with Dr YKrista Blue

## 2016-10-22 NOTE — Progress Notes (Signed)
[facility]  PATIENT: [name] [name] DOB: 06-Feb-1961   REASON FOR VISIT: follow up for seizure disorder,  nonepileptic episodes, increased  Memory problems, depression HISTORY FROM: Patient and friend    HISTORY OF PRESENT ILLNESS: HISTORY:YY [name] [name] is a 56 years old right-handed male, accompanied by his wife, was seen in refer by his primary care physician Dr. [name] for evaluation of seizure He had a past medical history of diabetes, hypertension, Crohn's disease, on immunosuppressive treatment, He presented with seizure-like event, reported initial event was June 28 2014, he was found by his wife to difficulty to be awake and eyes closed, took a while for him to open his eyes, and become very confused, also angry, episodes last about 5-10 minutes, there was also witnessed leg extension, jerking movement, He had multiple recurrent confusion episodes later, MRI of the brain, MRA of the brain in April 2013 was normal, EEG showed intermittent left anterior and medial temporal lobe slowing He later was also diagnosed with severe obstructive sleep apnea, using CPAP machine UPDATE August 1 st 2016:YYHe continue have recurrent episode while taking Vimpat 200 mg twice a day, and titrating dose of Keppra,  I have reviewed and summarized his video EEG monitoring from [facility] in June 17 to December 14 2014, patient reported different spells, 1, memory/sleepiness, preceded by headaches, often followed by right arm numbness, 2, staring and oral automatism, Video EEG monitoring was carried out during withdraw of the admission antiseizure medications, as well as sleep deprivation, for typical example of type I spells were recorded, with overall findings suggesting the diagnosis of psychogenic nonepileptic spells, is referred to psychotherapy, Keppra was tapered off,  Interictal EEG was normal, non-of the type II spells were recorded, clinical impression is that spell  type II is likely to be focal seizure, he was advised to keep Vimpat, decreased the dosage from 200 mg twice a day to 100 mg twice a day, He is also taking Lexapro 10 mg daily, which has helped his depression anxiety, UPDATE 04/25/2015 CMMr. [name], 56 year old male returns for follow-up. He has a history of obstructive sleep apnea with CPAP, focal seizures, and pseudoseizures. He is currently seeing a counselor Hilton Cork once a week and he has about 1 pseudoseizure per week. He is not seen a psychiatrist. He has felt counseling effective. He is now walking for exercise. He remains on Vimpat 100 twice daily without side effects. He is also on Lexapro. He returns for reevaluation UPDATE 10/23/2015 [name], 56 year old male returns for follow-up. He was last seen in the office 04/25/2015. Since that time the wife has noted several episodes of lip smacking which occurred in sleep. He continues to be compliant with CPAP however has not had a follow-up in 2 years with Dr. Brett Fairy. He also complains of worsening memory today last MRI of the brain in 2015. He has problems remembering names or identifying objects. MMSE today 28 out of 30. He continues to see a counselor every other week. He is on Lexapro for depression. Also has a history of diabetes and most recent hemoglobin A1c 5.2. He returns for reevaluation UPDATE 11/07/2017[name], 56 year old male returns for follow-up. In the last month he reports one to 2 times a week lip smacking and chewing with confusion afterwards. He has also had one fall with these episodes. He is currently on Vimpat 100 mg twice daily. He has been diagnosed with depression and is currently seeing a Social worker. He is on  Lexapro. He also has a history of diabetes but claims his blood sugars are never over 140. He claims he is compliant with his CPAP for obstructive sleep apnea. He returns for reevaluation UPDATE 10/22/16 [name], 56 year old male returns for followup for  his seizure disorder and history of pseudoseizures, significant depression and now increased memory loss. Patient reports he continues to have 1-2 seizures every month summer witnessed by his wife who says he completely blacks out and she is unable to get around his for several minutes. He is currently on Vimpat 150 twice daily. His primary care changed his Lexapro to Zoloft 50 mg daily. He continues to see a counselor every 2 weeks. He had a umbilical hernia surgery since last seen and feels his memory has gotten worse since that time. He denies any recent falls. He is also on CPAP for obstructive sleep apnea and he claims he is compliant with this machine. He claims he has had a 30 day event monitoring which was negative for any arrhythmias. He returns for follow-up    REVIEW OF SYSTEMS: Full 14 system review of systems performed and notable only for those listed, all others are neg:  Constitutional: Fatigue Cardiovascular: neg Ear/Nose/Throat: Hearing loss Skin: neg Respiratory shortness of breath Eyes:Light sensitivityatory: Blurred vision Gastroitestinal: Abdominal pain Hematology /Lymphatic: neg  Endocrine: neg Musculoskeletal: Walking difficulty Allergy/Immunology: neg Neurology Memory loss, seizure, headache Psychiatric Anxiety depression Sleep obstructive sleep apnea on CPAP  ALLERGIES: Allergies  Allergen Reactions  . Penicillins Hives     Has patient had a PCN reaction causing immediate rash, facial/tongue/throat swelling, SOB or lightheadedness with hypotension: # # # YES # # #  Has patient had a PCN reaction causing severe rash involving mucus membranes or skin necrosis: # # UNKNOWN # #  Has patient had a PCN reaction that required hospitalization  . # # NO # #  Has patient had a PCN reaction occurring within the last 10 years:  # # NO # #  If all of the above answers are "NO", then may proceed with Cephalosporin use.   . Sulfonamide Derivatives Hives  . Azithromycin  Swelling    SWELLING REACTION UNSPECIFIED   . Beef-Derived Products     RED MEAT > UNSPECIFIED REACTION   . Milk-Related Compounds     DAIRY >UNSPECIFIED REACTION   . Dilaudid [Hydromorphone Hcl] Nausea And Vomiting    HOME MEDICATIONS: Outpatient Medications Prior to Visit  Medication Sig Dispense Refill  . azelastine (ASTELIN) 0.1 % nasal spray Place 2 sprays into both nostrils 2 (two) times daily. Use in each nostril as directed 90 mL 3  . calcium carbonate (CALCI-CHEW) 1250 (500 Ca) MG chewable tablet Chew 2 tablets by mouth daily.    Marland Kitchen diltiazem (TIAZAC) 180 MG 24 hr capsule Take 1 capsule (180 mg total) by mouth daily. 90 capsule 3  . eletriptan (RELPAX) 40 MG tablet Take 1 tablet at onset of headache, may repeat in 2 hours if headache persists or reoccurs. (Patient taking differently: Take 40 mg by mouth every 2 (two) hours as needed. Take 1 tablet at onset of headache, may repeat in 2 hours if headache persists or reoccurs.) 12 tablet 4  . esomeprazole (NEXIUM) 40 MG capsule Take 1 capsule (40 mg total) by mouth 2 (two) times daily. 180 capsule 1  . Eszopiclone 3 MG TABS Take 1 tablet (3 mg total) by mouth at bedtime. Take immediately before bedtime 30 tablet 5  .  ferrous sulfate 325 (65 FE) MG tablet Take 325 mg by mouth daily with breakfast.    . folic acid (FOLVITE) 1 MG tablet Take 2 tablets (2 mg total) by mouth daily. 180 tablet 3  . glucose blood (CHOICE DM FORA G20 TEST STRIPS) test strip Use as instructed 100 each 12  . Ibuprofen-Diphenhydramine Cit (ADVIL PM PO) Take 2 tablets by mouth at bedtime.    . Lacosamide (VIMPAT) 150 MG TABS Take 1 tablet (150 mg total) by mouth 2 (two) times daily. 180 tablet 1  . Lactase (DAIRY-RELIEF PO) Take 2 tablets by mouth 2 (two) times daily.     Marland Kitchen loratadine (CLARITIN) 10 MG tablet Take 10 mg by mouth daily.    . mercaptopurine (PURINETHOL) 50 MG tablet TAKE 2 TABLETS DAILY ON AN EMPTY STOMACH 1 HOUR BEFOREOR 2 HOURS AFTER MEALS      (CAUTION: CHEMOTHERAPY) 180 tablet 0  . mesalamine (LIALDA) 1.2 g EC tablet Take 2 tablets (2.4 g total) by mouth daily. 180 tablet 1  . metFORMIN (GLUCOPHAGE) 500 MG tablet TAKE 1 TABLET EVERY EVENING (Patient taking differently: Take 1,000 mg by mouth every evening. ) 90 tablet 3  . methscopolamine (PAMINE FORTE) 5 MG tablet Take 1 tablet (5 mg total) by mouth 2 (two) times daily. 180 tablet 1  . ONE TOUCH ULTRA TEST test strip USE AS INSTRUCTED 100 each 2  . ONETOUCH DELICA LANCETS 66A MISC USE AS INSTRUCTED (NEEDS OFFICE VISIT) 100 each 0  . potassium chloride SA (K-DUR,KLOR-CON) 20 MEQ tablet Take 1 tablet (20 mEq total) by mouth 2 (two) times daily. 180 tablet 3  . Probiotic Product (PROBIOTIC PO) Take 1 tablet by mouth daily.    . sertraline (ZOLOFT) 50 MG tablet Take 1 tablet (50 mg total) by mouth daily. 30 tablet 3  . atorvastatin (LIPITOR) 10 MG tablet Take 1 tablet (10 mg total) by mouth daily. (Patient not taking: Reported on 10/22/2016) 30 tablet 3  . escitalopram (LEXAPRO) 20 MG tablet Take 1 tablet (20 mg total) by mouth daily. (Patient not taking: Reported on 10/22/2016) 90 tablet 3   Facility-Administered Medications Prior to Visit  Medication Dose Route Frequency Provider Last Rate Last Dose  . gadopentetate dimeglumine (MAGNEVIST) injection 20 mL  20 mL Intravenous Once PRN Marcial Pacas, MD        PAST MEDICAL HISTORY: Past Medical History:  Diagnosis Date  . Adenomatous colon polyp 10/1999  . Anxiety   . Arthritis    neck, yoga helps.  . Cancer ([place])    skin  . Cataract   . Complex partial seizure ([place])   . Crohn's disease of small and large intestines ([place]) 1999  . Depression   . Diabetes mellitus   . Esophageal stricture   . External hemorrhoids   . GERD (gastroesophageal reflux disease)   . History of kidney stones    3 large stones still present  . History of nuclear stress test 11/15/2010   exercise myoview; normal pattern of perfusion; normal, low risk study     . Hyperlipemia   . Hypertension   . Migraines   . MRSA (methicillin resistant Staphylococcus aureus) 10/2010   . Osteoporosis   . Pneumonia   . PONV (postoperative nausea and vomiting)   . Seizures ([place])   . Skin cancer    squamous cell multiple; [place]; followed every 3 months.  . Sleep apnea    CPAP machine, uses nightly    PAST SURGICAL HISTORY: Past  Surgical History:  Procedure Laterality Date  . CATARACT EXTRACTION Bilateral   . ELBOW SURGERY  2012   elbow MRSA infection   . EYE SURGERY     cataracts removed, /w IOL  . INSERTION OF MESH N/A 07/01/2016   Procedure: INSERTION OF MESH;  Surgeon: [name], MD;  Location: [place];  Service: General;  Laterality: N/A;  . SHOULDER SURGERY    . SINUS SURGERY WITH INSTATRAK    . TRANSTHORACIC ECHOCARDIOGRAM  02/22/2011   EF 55-65%; increased pattern of LVH with mild conc hypertrophy, abnormal relaxation & increased filling pressure (grade 2 diastolic dysfunction); atrial septum thickened (lipomatous hypertrophy)  . UMBILICAL HERNIA REPAIR N/A 07/01/2016   Procedure: UMBILICAL HERNIA REPAIR WITH MESH;  Surgeon: [name], MD;  Location: [place];  Service: General;  Laterality: N/A;  . VASECTOMY      FAMILY HISTORY: Family History  Problem Relation Age of Onset  . Colon polyps Father   . Stroke Father   . Hyperlipidemia Father   . Hypertension Father   . Heart disease Mother     CABG at age 5  . Hyperlipidemia Mother   . Hypertension Mother   . Stroke Paternal Grandmother   . Stroke Paternal Grandfather   . Other Child     tetrology of fallot (cornealia deland syndrome)  . Colon cancer Neg Hx     SOCIAL HISTORY: Social History   Social History  . Marital status: Married    Spouse name: [name]  . Number of children: 2  . Years of education: college   Occupational History  . disabled     seizure disorder with memory impairment   Social History Main Topics  . Smoking status: Never Smoker  . Smokeless  tobacco: Never Used  . Alcohol use No  . Drug use: No  . Sexual activity: Yes    Birth control/ protection: Surgical     Comment: 1 partner in last 12 months   Other Topics Concern  . Not on file   Social History Narrative   Marital status: married x 32 years;       Lives:   lives at home with his family.      Children:  two children; no grandchildren.  Son is special needs at age 40yo; Cornelian Delaine with TOF.       Employment: unemployed; [employer] long term disability      Tobacco: none      Alcohol: none      Drugs: none      Exercise: unable to exercise due to frequent falls   Patient has a college education.   Patient is right-handed.   Patient drinks one cup of soda daily.     PHYSICAL EXAM  Vitals:   10/22/16 0920  BP: 136/86  Pulse: (!) 107  Weight: 221 lb 9.6 oz (100.5 kg)  Height: 6' (1.829 m)   Body mass index is 30.05 kg/m. Generalized: Well developed, obese male in no acute distress  Head: normocephalic and atraumatic,. Oropharynx benign  Neck: Supple, no carotid bruits  Cardiac: Regular rate rhythm, no murmur  Musculoskeletal: No deformity   Neurological examination   Mentation: Alert oriented to time, place, history taking. Attention span and concentration appropriate. Follows all commands speech and language fluent.   Cranial nerve II-XII: Fundoscopic exam reveals sharp disc margins.Pupils were equal round reactive to light extraocular movements were full, visual field were full on confrontational test. Facial sensation and strength were  normal. hearing was intact to finger rubbing bilaterally. Uvula tongue midline. head turning and shoulder shrug were normal and symmetric.Tongue protrusion into cheek strength was normal. Motor: normal bulk and tone, full strength in the BUE, BLE, fine finger movements normal, no pronator drift. No focal weakness Sensory: Mildly length dependent decreased light touch pinprick and vibratory at toes   Coordination: finger-nose-finger, heel-to-shin bilaterally, no dysmetria Reflexes: Brachioradialis 2/2, biceps 2/2, triceps 2/2, patellar 2/2, Achilles 2/2, plantar responses were flexor bilaterally. Gait and Station: Rising up from seated position without assistance, normal stance, moderate stride, good arm swing, smooth turning, able to perform tiptoe, and heel walking without difficulty. Tandem gait is unsteady. No assistive device  DIAGNOSTIC DATA (LABS, IMAGING, TESTING) - I reviewed patient records, labs, notes, testing and imaging myself where available.  Lab Results  Component Value Date   WBC 7.5 08/20/2016   HGB 13.1 06/27/2016   HCT 37.8 08/20/2016   MCV 104 (H) 08/20/2016   PLT 188 08/20/2016      Component Value Date/Time   NA 142 08/20/2016 1150   K 4.7 08/20/2016 1150   CL 104 08/20/2016 1150   CO2 18 08/20/2016 1150   GLUCOSE 156 (H) 08/20/2016 1150   GLUCOSE 138 (H) 06/27/2016 0928   BUN 19 08/20/2016 1150   CREATININE 1.06 08/20/2016 1150   CREATININE 0.84 05/15/2016 1005   CALCIUM 9.4 08/20/2016 1150   PROT 7.1 08/20/2016 1150   ALBUMIN 4.3 08/20/2016 1150   AST 29 08/20/2016 1150   ALT 23 08/20/2016 1150   ALKPHOS 116 08/20/2016 1150   BILITOT 2.6 (H) 08/20/2016 1150   GFRNONAA 79 08/20/2016 1150   GFRNONAA >89 06/03/2015 0858   GFRAA 92 08/20/2016 1150   GFRAA >89 06/03/2015 0858    Lab Results  Component Value Date   HGBA1C 5.6 08/20/2016   Lab Results  Component Value Date   VITAMINB12 >1500 (H) 01/10/2015       ASSESSMENT AND PLAN 56 y.o. year old male has a past medical history of Hypertension; Diabetes mellitus; Hyperlipemia; Sleep apnea; Complex partial seizure ([place]); Migraines; and pseudoseizures here to follow-up.He continues to have 1 or 2 seizure events a month where he blacks out and is unaware for about 2 minutes. MRI of the brain 11/01/15 with no acute findings and no changes when compared to previous in 2015. He continues to  see a counselor every 2 weeks for his depression  Discussed with Dr. Krista Blue Continue  Vimpat 150 mg twice daily Add on Depakote 596m ER twice daily Continue seeing a counselor Continue Zoloft 54mat current dose by PCP Will set up for neuropsych evaluation F/U in 4 months follow up appt with Dr YaKrista Blue spent 30 min in total face to face time with the patient and friend more than 50% of which was spent counseling and coordination of care, reviewing test results reviewing medications and discussing and reviewing the diagnosis of seizure disorder and memory loss and depression. Explained  neuropsych evaluation and the  reasoning behind ordering this to give usKoreassistance with further treatment options. , NaRayburn MaBCSo [facility] Hl[facility] - Cres[facility] Pines CampusAPRN  [facility] [address]SuLouisvillerOswegoNC [phone]

## 2016-10-22 NOTE — Progress Notes (Signed)
Fax confirmation received [facility]impat [facility]

## 2016-10-25 DIAGNOSIS — G4733 Obstructive sleep apnea (adult) (pediatric): Secondary | ICD-10-CM | POA: Diagnosis not present

## 2016-10-28 NOTE — Progress Notes (Signed)
I have reviewed and agreed above plan. 

## 2016-10-29 ENCOUNTER — Ambulatory Visit: Payer: 59 | Admitting: Nurse Practitioner

## 2016-11-05 ENCOUNTER — Telehealth: Payer: Self-pay | Admitting: Neurology

## 2016-11-05 ENCOUNTER — Other Ambulatory Visit: Payer: Self-pay | Admitting: Emergency Medicine

## 2016-11-05 ENCOUNTER — Ambulatory Visit: Payer: 59 | Admitting: Neurology

## 2016-11-05 MED ORDER — SERTRALINE HCL 50 MG PO TABS
50.0000 mg | ORAL_TABLET | Freq: Every day | ORAL | 3 refills | Status: DC
Start: 2016-11-05 — End: 2016-11-19

## 2016-11-05 NOTE — Telephone Encounter (Signed)
Please stop Depakote due to these side effects - I called Mrs Beehler and told her to hold the medication, no weaning necessary. He was only on it for 5 days. CD

## 2016-11-05 NOTE — Telephone Encounter (Signed)
Pt wife calling to inform pt not feeling well enough to come to appointment today. Pt wife states pt started the divalproex (DEPAKOTE ER) 500 MG 24 hr tablet about 2 weeks ago and not sure if this is what is causing him not to feel well.  She is asking for a message to be left if she is unavail when call is returned.

## 2016-11-05 NOTE — Telephone Encounter (Signed)
I called pt's wife, [name], per DPR. She advised me that pt started depakote (ordered by Hoyle Sauer, NP on 10/22/2016) and since then they have noticed dizziness, speech difficulty, instability, and confusion in the pt. They are wondering if this is related to the depakote. Pt is also taking zoloft, lunesta qhs, and vimpat. I advised pt's wife that I will send her concerns to Dr. Brett Fairy.

## 2016-11-06 ENCOUNTER — Encounter: Payer: Self-pay | Admitting: Neurology

## 2016-11-06 NOTE — Addendum Note (Signed)
Addended by: Lester McKenzie A on: 11/06/2016 07:38 AM   Modules accepted: Orders

## 2016-11-12 ENCOUNTER — Encounter: Payer: Self-pay | Admitting: Family Medicine

## 2016-11-12 DIAGNOSIS — Z09 Encounter for follow-up examination after completed treatment for conditions other than malignant neoplasm: Secondary | ICD-10-CM | POA: Diagnosis not present

## 2016-11-18 ENCOUNTER — Other Ambulatory Visit: Payer: Self-pay | Admitting: Gastroenterology

## 2016-11-19 ENCOUNTER — Ambulatory Visit (INDEPENDENT_AMBULATORY_CARE_PROVIDER_SITE_OTHER): Payer: 59 | Admitting: Family Medicine

## 2016-11-19 ENCOUNTER — Encounter: Payer: Self-pay | Admitting: Family Medicine

## 2016-11-19 VITALS — BP 128/77 | HR 94 | Temp 98.3°F | Resp 18 | Ht 72.0 in | Wt 217.0 lb

## 2016-11-19 DIAGNOSIS — G40209 Localization-related (focal) (partial) symptomatic epilepsy and epileptic syndromes with complex partial seizures, not intractable, without status epilepticus: Secondary | ICD-10-CM | POA: Diagnosis not present

## 2016-11-19 DIAGNOSIS — R7989 Other specified abnormal findings of blood chemistry: Secondary | ICD-10-CM

## 2016-11-19 DIAGNOSIS — F321 Major depressive disorder, single episode, moderate: Secondary | ICD-10-CM

## 2016-11-19 DIAGNOSIS — R404 Transient alteration of awareness: Secondary | ICD-10-CM | POA: Diagnosis not present

## 2016-11-19 DIAGNOSIS — I10 Essential (primary) hypertension: Secondary | ICD-10-CM

## 2016-11-19 DIAGNOSIS — R569 Unspecified convulsions: Secondary | ICD-10-CM | POA: Diagnosis not present

## 2016-11-19 DIAGNOSIS — E7211 Homocystinuria: Secondary | ICD-10-CM | POA: Diagnosis not present

## 2016-11-19 DIAGNOSIS — R413 Other amnesia: Secondary | ICD-10-CM

## 2016-11-19 DIAGNOSIS — E119 Type 2 diabetes mellitus without complications: Secondary | ICD-10-CM

## 2016-11-19 MED ORDER — FOLIC ACID 1 MG PO TABS: 2.0000 mg | ORAL_TABLET | Freq: Every day | ORAL | 3 refills | Status: DC

## 2016-11-19 MED ORDER — SERTRALINE HCL 100 MG PO TABS: 100.0000 mg | ORAL_TABLET | Freq: Every day | ORAL | 1 refills | Status: DC

## 2016-11-19 MED ORDER — METFORMIN HCL 500 MG PO TABS: ORAL_TABLET | ORAL | 3 refills | Status: DC

## 2016-11-19 NOTE — Patient Instructions (Signed)
     IF you received an x-ray today, you will receive an invoice from [facility]. Please contact [facility] at [phone] with questions or concerns regarding your invoice.   IF you received labwork today, you will receive an invoice from [facility]. Please contact [facility] at [phone] with questions or concerns regarding your invoice.   Our billing staff will not be able to assist you with questions regarding bills from these companies.  You will be contacted with the lab results as soon as they are available. The fastest way to get your results is to activate your My Chart account. Instructions are located on the last page of this paperwork. If you have not heard from us regarding the results in 2 weeks, please contact this office.     
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
## 2016-11-20 LAB — CBC WITH DIFFERENTIAL/PLATELET
Basophils Absolute: 0.1 10*3/uL (ref 0.0–0.2)
Basos: 1 %
EOS (ABSOLUTE): 0.2 10*3/uL (ref 0.0–0.4)
EOS: 5 %
HEMATOCRIT: 36.7 % — AB (ref 37.5–51.0)
HEMOGLOBIN: 12.6 g/dL — AB (ref 13.0–17.7)
Immature Grans (Abs): 0 10*3/uL (ref 0.0–0.1)
Immature Granulocytes: 1 %
LYMPHS ABS: 0.8 10*3/uL (ref 0.7–3.1)
Lymphs: 16 %
MCH: 36.2 pg — AB (ref 26.6–33.0)
MCHC: 34.3 g/dL (ref 31.5–35.7)
MCV: 106 fL — AB (ref 79–97)
MONOCYTES: 9 %
Monocytes Absolute: 0.4 10*3/uL (ref 0.1–0.9)
NEUTROS ABS: 3.3 10*3/uL (ref 1.4–7.0)
Neutrophils: 68 %
Platelets: 130 10*3/uL — ABNORMAL LOW (ref 150–379)
RBC: 3.48 x10E6/uL — ABNORMAL LOW (ref 4.14–5.80)
RDW: 17 % — AB (ref 12.3–15.4)
WBC: 4.9 10*3/uL (ref 3.4–10.8)

## 2016-11-20 LAB — COMPREHENSIVE METABOLIC PANEL
A/G RATIO: 1.4 (ref 1.2–2.2)
ALBUMIN: 4.1 g/dL (ref 3.5–5.5)
ALT: 22 IU/L (ref 0–44)
AST: 31 IU/L (ref 0–40)
Alkaline Phosphatase: 140 IU/L — ABNORMAL HIGH (ref 39–117)
BILIRUBIN TOTAL: 2.5 mg/dL — AB (ref 0.0–1.2)
BUN/Creatinine Ratio: 17 (ref 9–20)
BUN: 16 mg/dL (ref 6–24)
CO2: 18 mmol/L (ref 18–29)
CREATININE: 0.95 mg/dL (ref 0.76–1.27)
Calcium: 9.1 mg/dL (ref 8.7–10.2)
Chloride: 104 mmol/L (ref 96–106)
GFR calc Af Amer: 104 mL/min/{1.73_m2} (ref >59)
GFR calc non Af Amer: 90 mL/min/{1.73_m2} (ref >59)
GLOBULIN, TOTAL: 3 g/dL (ref 1.5–4.5)
Glucose: 123 mg/dL — ABNORMAL HIGH (ref 65–99)
POTASSIUM: 3.8 mmol/L (ref 3.5–5.2)
SODIUM: 139 mmol/L (ref 134–144)
Total Protein: 7.1 g/dL (ref 6.0–8.5)

## 2016-11-20 LAB — HEMOGLOBIN A1C
Est. average glucose Bld gHb Est-mCnc: 105 mg/dL
Hgb A1c MFr Bld: 5.3 % (ref 4.8–5.6)

## 2016-11-25 ENCOUNTER — Encounter: Payer: Self-pay | Admitting: Family Medicine

## 2016-12-03 ENCOUNTER — Encounter: Payer: 59 | Admitting: Psychology

## 2016-12-05 ENCOUNTER — Telehealth: Payer: Self-pay | Admitting: Gastroenterology

## 2016-12-05 MED ORDER — MERCAPTOPURINE 50 MG PO TABS: ORAL_TABLET | ORAL | 0 refills | Status: DC

## 2016-12-05 MED ORDER — METHSCOPOLAMINE BROMIDE 5 MG PO TABS: 5.0000 mg | ORAL_TABLET | Freq: Two times a day (BID) | ORAL | 0 refills | Status: DC

## 2016-12-05 NOTE — Telephone Encounter (Signed)
Prescriptions sent to mail order pharmacy and informed patient to keep appt for any further refills. Patient verbalized understanding.

## 2016-12-18 ENCOUNTER — Other Ambulatory Visit: Payer: Self-pay | Admitting: Gastroenterology

## 2017-01-07 DIAGNOSIS — C4442 Squamous cell carcinoma of skin of scalp and neck: Secondary | ICD-10-CM | POA: Diagnosis not present

## 2017-01-07 DIAGNOSIS — C44229 Squamous cell carcinoma of skin of left ear and external auricular canal: Secondary | ICD-10-CM | POA: Diagnosis not present

## 2017-01-07 DIAGNOSIS — L570 Actinic keratosis: Secondary | ICD-10-CM | POA: Diagnosis not present

## 2017-01-07 DIAGNOSIS — C44622 Squamous cell carcinoma of skin of right upper limb, including shoulder: Secondary | ICD-10-CM | POA: Diagnosis not present

## 2017-01-07 DIAGNOSIS — Z85828 Personal history of other malignant neoplasm of skin: Secondary | ICD-10-CM | POA: Diagnosis not present

## 2017-01-07 DIAGNOSIS — C44629 Squamous cell carcinoma of skin of left upper limb, including shoulder: Secondary | ICD-10-CM | POA: Diagnosis not present

## 2017-01-07 DIAGNOSIS — L814 Other melanin hyperpigmentation: Secondary | ICD-10-CM | POA: Diagnosis not present

## 2017-01-07 DIAGNOSIS — L720 Epidermal cyst: Secondary | ICD-10-CM | POA: Diagnosis not present

## 2017-01-21 ENCOUNTER — Ambulatory Visit (INDEPENDENT_AMBULATORY_CARE_PROVIDER_SITE_OTHER): Payer: 59 | Admitting: Psychology

## 2017-01-21 DIAGNOSIS — F329 Major depressive disorder, single episode, unspecified: Secondary | ICD-10-CM

## 2017-01-21 DIAGNOSIS — G40209 Localization-related (focal) (partial) symptomatic epilepsy and epileptic syndromes with complex partial seizures, not intractable, without status epilepticus: Secondary | ICD-10-CM

## 2017-01-21 DIAGNOSIS — F32A Depression, unspecified: Secondary | ICD-10-CM

## 2017-01-21 DIAGNOSIS — F445 Conversion disorder with seizures or convulsions: Secondary | ICD-10-CM | POA: Diagnosis not present

## 2017-01-21 NOTE — Progress Notes (Signed)
NEUROPSYCHOLOGICAL INTERVIEW (CPT: [id number])  Name: [name] [name] Date of Birth: [birth year] Date of Interview: 01/21/2017  Reason for Referral:  [name] [name] is a 56 y.o. right handed male who is referred for neuropsychological evaluation by Cecille Rubin, NP of [facility] due to concerns about memory problems. This patient is accompanied in the office by his wife who supplements the history.  History of Presenting Problem:  Per his wife, [name] reportedly started demonstrating seizure like episodes at least five years ago. In 2013, he was in the ER for some other reason and a PA observed some lip smacking and "zoning out" behavior and said it was a seizure. His wife reported memory problems started about 7 years ago, which could be when seizures started and they didn't realize it. Over time his memory problems apparently got to the point that they were interfering with his work. He reported he initially attributed his memory difficulties to age and stress level but the problems ultimately got to the point where he had to seek short term, and then long term, disability. His seizures appeared to be medically refractory so he completed a five day admission with continuous EEG and video monitoring at [facility] in 2016. During this evaluation he demonstrated psychogenic nonepileptic spells and this was his discharge diagnosis. He and his wife report that they understand he has "both complex partial seizures and nonepileptic seizures". He started seeing a therapist after his admission. He continues to see his counselor once every two weeks and reports this has bene helpful. However he continues to have frequent seizure like episodes during which he demonstrates altered mental status with disorientation to time. For example, in the middle of the night recently he woke up and thought it was 1983 and didn't know who his wife was. After going back to sleep, he is fine upon awakening and does not  recall the episode. He also has spells where he is "non-responsive", just staring and not responding to questions. In the past, he has had episodes of "passing out for no reason". Episodes occur about twice a week on average. He has been taking Vimpat for a few years and Depakote was added recently but this was discontinued after five days due to side effects.  In addition to spells, the patient has constant memory difficulty which is reportedly worsening over time. He also has constant dizziness and "balance issues". He reports he is clumsy and has "lost some tactile perception". He reports tremulousness in his upper extremities. His wife reports that his speech is frequently slurred.   He has Chron's disease but it is apparently pretty well managed (typically has GI distress after eating about 25 % of the time). He had MRSA in 2012 which was pretty significant and required surgical intervention in his arm. He has diabetes which is well controlled per his wife. He has a history of multiple possible concussions. He reports concussions without LOC but confusion afterwards in the seventh grade and in high school. He reports he had a bad concussion around 83 when he was playing softball and ran into another player. He did not lose consciousness but did break his nose.   Upon direct questioning, the patient and his wife reported the following with regard to current cognitive functioning:   Forgetting recent conversations/events: Yes Repeating statements/questions: Yes Misplacing/losing items: Not usually, he has a strict place where he puts things Forgetting appointments or other obligations: Yes Forgetting to take medications: Yes, and will take the wrong  ones. His wife gets them out in the morning and sets them up for him.  Difficulty concentrating: Yes Starting but not finishing tasks: Yes Distracted easily: Yes Processing information more slowly: Yes (although I do not see evidence of this during  the interview today)  Word-finding difficulty: Yes (although I do not see evidence of this during the interview today) Comprehension difficulty: Yes (" ")  Uncertain about directions when a passenger in a vehicle: Yes  He hasn't driven in about 5 years since he was told he was having seizures. They note he is very hypervigilant and with hyperarousal when they are in a car. He frequently thinks they are going to fast or are going to hit something.   With regard to mood, the patient reports that he has been depressed for a long time but did not realize it until he was told he was by a doctor at [facility] during his epilepsy evaluation in 2016. He reports that he blames himself for everything, including things he has no control over. He reported that for a long time he blamed himself for having a special needs child. (His son, who is 72, was born with developmental disability and was not supposed to live past 55yo. He is 55 lbs and functions at the level of a 55yo; he wears diapers, is blind, unable to speak and virtually deaf.) He also reports he is depressed about not being able to work. He has been on disability since February 2016. He has a history of insomnia which was improved when he was put on Lunesta. He has sleep apnea and wears his CPAP. He used to overeat but he eats much less and much healthier now. He has lost 60 lbs.  The patient reported symptoms consistent with depersonalization, wherein he feels as though he is watching his life as if it were a play.  When asked about history of psychological trauma, the patient denies any history of abuse or other trauma but notes that he feels much of his depression stems from the way his mother treated him throughout his life. He stated that his mother never showed him love but was very loving with his siblings. Upon direct questioning, the patient's wife reported that she knew some of the patient's history with his mother but did not realize until more  recently that it affected him to this extent. Additionally, she did not know about the guilt he was feeling for many things until the past few months.   The patient reported a history of frequent suicidal ideation also did engage in planning, but never attempted. He reported this has gotten better since seeing a therapist. He denied current suicidal intention.  In terms of family history, there is no history of seizures that he knows of. His sister has some health issues and has gone to [facility] for evaluation, but she has not been forthcoming about her exact health issues. There is a family history of dementia in the patient's grandmother, and his mother was starting to show signs before she passed away. His father had a brain aneurysm.    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 Adenomatous colon polyp 10/1999  .  Anxiety   . Arthritis    neck, yoga helps.  . Cancer ([place])    skin  . Cataract   . Complex partial seizure ([place])   . Crohn's disease of small and large intestines ([place]) 1999  . Depression   . Diabetes mellitus   . Esophageal stricture   . External hemorrhoids   . GERD (gastroesophageal reflux disease)   . History of kidney stones    3 large stones still present  . History of nuclear stress test 11/15/2010   exercise myoview; normal pattern of perfusion; normal, low risk study   . Hyperlipemia   . Hypertension   . Migraines   . MRSA (methicillin resistant Staphylococcus aureus) 10/2010   . Osteoporosis   . Pneumonia   . PONV (postoperative nausea and vomiting)   . Seizures ([place])   . Skin cancer    squamous cell multiple;  [facility]; followed every 3 months.  . Sleep apnea    CPAP machine, uses nightly     Current Medications:  Outpatient Encounter Prescriptions as of 01/21/2017  Medication Sig  . aspirin EC 81 MG tablet Take 81 mg by mouth daily.  Marland Kitchen azelastine (ASTELIN) 0.1 % nasal spray Place 2 sprays into both nostrils 2 (two) times daily. Use in each nostril as directed  . calcium carbonate (CALCI-CHEW) 1250 (500 Ca) MG chewable tablet Chew 2 tablets by mouth daily.  . cholecalciferol (VITAMIN D) 1000 units tablet Take 2,000 Units by mouth daily.  Marland Kitchen diltiazem (TIAZAC) 180 MG 24 hr capsule Take 1 capsule (180 mg total) by mouth daily.  Marland Kitchen eletriptan (RELPAX) 40 MG tablet Take 1 tablet at onset of headache, may repeat in 2 hours if headache persists or reoccurs. (Patient taking differently: Take 40 mg by mouth every 2 (two) hours as needed. Take 1 tablet at onset of headache, may repeat in 2 hours if headache persists or reoccurs.)  . esomeprazole (NEXIUM) 40 MG capsule TAKE 1 CAPSULE TWICE A DAY  . Eszopiclone 3 MG TABS Take 1 tablet (3 mg total) by mouth at bedtime. Take immediately before bedtime  . ferrous sulfate 325 (65 FE) MG tablet Take 325 mg by mouth daily with breakfast.  . folic acid (FOLVITE) 1 MG tablet Take 2 tablets (2 mg total) by mouth daily.  Marland Kitchen glucose blood (CHOICE DM FORA G20 TEST STRIPS) test strip Use as instructed  . Ibuprofen-Diphenhydramine Cit (ADVIL PM PO) Take 2 tablets by mouth at bedtime.  . Lacosamide (VIMPAT) 150 MG TABS Take 1 tablet (150 mg total) by mouth 2 (two) times daily.  . Lactase (DAIRY-RELIEF PO) Take 2 tablets by mouth 2 (two) times daily.   Marland Kitchen loratadine (CLARITIN) 10 MG tablet Take 10 mg by mouth daily.  . mercaptopurine (PURINETHOL) 50 MG tablet TAKE 2 TABLETS DAILY ON AN EMPTY STOMACH 1 HOUR BEFOREOR 2 HOURS AFTER MEALS     (CAUTION: CHEMOTHERAPY)  . mesalamine (LIALDA) 1.2 g EC tablet Take 2 tablets (2.4 g total) by mouth daily.  . metFORMIN (GLUCOPHAGE) 500 MG  tablet TAKE 1 TABLET EVERY EVENING  . methscopolamine (PAMINE FORTE) 5 MG tablet Take 1 tablet (5 mg total) by mouth 2 (two) times daily.  . ONE TOUCH ULTRA TEST test strip USE AS INSTRUCTED  . ONETOUCH DELICA LANCETS 75Q MISC USE AS INSTRUCTED (NEEDS OFFICE VISIT)  . potassium chloride SA (K-DUR,KLOR-CON) 20 MEQ tablet Take 1 tablet (20 mEq total) by mouth 2 (two) times daily.  . Probiotic Product (PROBIOTIC PO)  Take 1 tablet by mouth daily.  . sertraline (ZOLOFT) 100 MG tablet Take 1 tablet (100 mg total) by mouth daily.   Facility-Administered Encounter Medications as of 01/21/2017  Medication  . gadopentetate dimeglumine (MAGNEVIST) injection 20 mL     Behavioral Observations:   Appearance: Casually and appropriately dressed and groomed Gait: Ambulated independently, mild unsteadiness Speech: Fluent; normal rate, rhythm and volume. No significant word finding difficulty observed during conversational speech. Thought process: Linear  Affect: Full, anxious Interpersonal: Pleasant, appropriate   TESTING: There is medical necessity to proceed with neuropsychological assessment as the results will be used to aid in differential diagnosis and clinical decision-making and to inform specific treatment recommendations. Per the patient, his wife and medical records reviewed, there has been a change in cognitive functioning and a need for objective testing of cognitive functioning in order to determine neurologic versus psychiatric etiology of symptoms.   PLAN: The patient will return for a full battery of neuropsychological testing with a psychometrician under my supervision. Education regarding testing procedures was provided. Subsequently, the patient will see this provider for a follow-up session at which time his test performances and my impressions and treatment recommendations will be reviewed in detail.  Full neuropsychological evaluation report to follow.

## 2017-01-22 ENCOUNTER — Encounter: Payer: Self-pay | Admitting: Psychology

## 2017-01-29 ENCOUNTER — Ambulatory Visit (INDEPENDENT_AMBULATORY_CARE_PROVIDER_SITE_OTHER): Payer: 59 | Admitting: Psychology

## 2017-01-29 DIAGNOSIS — R413 Other amnesia: Secondary | ICD-10-CM | POA: Diagnosis not present

## 2017-01-29 DIAGNOSIS — F445 Conversion disorder with seizures or convulsions: Secondary | ICD-10-CM

## 2017-01-29 NOTE — Progress Notes (Signed)
   Neuropsychology Note  Shown Dissinger returned today for 3 hours of neuropsychological testing with technician, Milana Kidney, BS, under the supervision of Dr. Macarthur Critchley. The patient did not appear overtly distressed by the testing session, per behavioral observation or via self-report to the technician. Rest breaks were offered. Rhyse Loux will return within 2 weeks for a feedback session with Dr. Si Raider at which time his test performances, clinical impressions and treatment recommendations will be reviewed in detail. The patient understands he can contact our office should he require our assistance before this time.  Full report to follow.

## 2017-01-30 DIAGNOSIS — G4733 Obstructive sleep apnea (adult) (pediatric): Secondary | ICD-10-CM | POA: Diagnosis not present

## 2017-02-03 NOTE — Progress Notes (Signed)
NEUROPSYCHOLOGICAL EVALUATION   Name:    [name] [name]  Date of Birth:   1961/05/24 Date of Interview:  01/21/2017 Date of Testing:  01/29/2017   Date of Feedback:  02/06/2017       Background Information:  Reason for Referral:  [name] [name] is a 56 y.o. right handed male referred by [name] Rubin, NP, of [facility], to assess his current level of cognitive functioning and assist in differential diagnosis. The current evaluation consisted of a review of available medical records, an interview with the patient and his wife, and the completion of a neuropsychological testing battery. Informed consent was obtained.  History of Presenting Problem:  Per his wife, [name] [name] reportedly started demonstrating seizure like episodes at least five years ago. In 2013, he was in the ER for some other reason and a PA observed some lip smacking and "zoning out" behavior and said it was a seizure. His wife reported memory problems started about 7 years ago, which could be when seizures started and they didn't realize it. Over time his memory problems apparently got to the point that they were interfering with his work. He reported he initially attributed his memory difficulties to age and stress level but the problems ultimately got to the point where he had to seek short term, and then long term, disability. His seizures appeared to be medically refractory so he completed a five day admission with continuous EEG and video monitoring at [facility] in 2016. During this evaluation he demonstrated psychogenic nonepileptic spells and this was his discharge diagnosis. He and his wife report that they understand he has "both complex partial seizures and nonepileptic seizures". He started seeing a therapist after his admission. He continues to see his counselor once every two weeks and reports this has bene helpful. However he continues to have frequent seizure like episodes during which he demonstrates altered mental status with  disorientation to time. For example, in the middle of the night recently he woke up and thought it was 1983 and didn't know who his wife was. After going back to sleep, he is fine upon awakening and does not recall the episode. He also has spells where he is "non-responsive", just staring and not responding to questions. In the past, he has had episodes of "passing out for no reason". Episodes occur about twice a week on average. He has been taking Vimpat for a few years and Depakote was added recently but this was discontinued after five days due to side effects.  In addition to spells, the patient has constant memory difficulty which is reportedly worsening over time. He also has constant dizziness and "balance issues". He reports he is clumsy and has "lost some tactile perception". He reports tremulousness in his upper extremities. His wife reports that his speech is frequently slurred.   He has Chron's disease but it is apparently pretty well managed (typically has GI distress after eating about 25 % of the time). He had MRSA in 2012 which was pretty significant and required surgical intervention in his arm. He has diabetes which is well controlled per his wife. He has a history of multiple possible concussions. He reports concussions without LOC but confusion afterwards in the seventh grade and in high school. He reports he had a bad concussion around 60 when he was playing softball and ran into another player. He did not lose consciousness but did break his nose.   Upon direct questioning, the patient and his wife reported the following  with regard to current cognitive functioning:   Forgetting recent conversations/events: Yes Repeating statements/questions: Yes Misplacing/losing items: Not usually, he has a strict place where he puts things Forgetting appointments or other obligations: Yes Forgetting to take medications: Yes, and will take the wrong ones. His wife gets them out in the  morning and sets them up for him.  Difficulty concentrating: Yes Starting but not finishing tasks: Yes Distracted easily: Yes Processing information more slowly: Yes (although I do not see evidence of this during the interview today)  Word-finding difficulty: Yes (although I do not see evidence of this during the interview today) Comprehension difficulty: Yes (" ")  Uncertain about directions when a passenger in a vehicle: Yes  He hasn't driven in about 5 years since he was told he was having seizures. They note he is very hypervigilant and with hyperarousal when they are in a car. He frequently thinks they are going to fast or are going to hit something.   With regard to mood, the patient reports that he has been depressed for a long time but did not realize it until he was told he was by a doctor at [facility] during his epilepsy evaluation in 2016. He reports that he blames himself for everything, including things he has no control over. He reported that for a long time he blamed himself for having a special needs child. (His son, who is 54, was born with developmental disability and was not supposed to live past 55yo. He is 55 lbs and functions at the level of a 55yo; he wears diapers, is blind, unable to speak and virtually deaf.) He also reports he is depressed about not being able to work. He has been on disability since February 2016. He has a history of insomnia which was improved when he was put on Lunesta. He has sleep apnea and wears his CPAP. He used to overeat but he eats much less and much healthier now. He has lost 60 lbs.  The patient reported symptoms consistent with depersonalization, wherein he feels as though he is watching his life as if it were a play.  When asked about history of psychological trauma, the patient denies any history of abuse or other trauma but notes that he feels much of his depression stems from the way his mother treated him throughout his life. He  stated that his mother never showed him love but was very loving with his siblings. Upon direct questioning, the patient's wife reported that she knew some of the patient's history with his mother but did not realize until more recently that it affected him to this extent. Additionally, she did not know about the guilt he was feeling for many things until the past few months.   The patient reported a history of frequent suicidal ideation also did engage in planning, but never attempted. He reported this has gotten better since seeing a therapist. He denied current suicidal intention.  In terms of family history, there is no history of seizures that he knows of. His sister has some health issues and has gone to [facility] for evaluation, but she has not been forthcoming about her exact health issues. There is a family history of dementia in the patient's grandmother, and his mother was starting to show signs before she passed away. His father had a brain aneurysm.    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 Adenomatous colon polyp 10/1999  . Anxiety   . Arthritis    neck, yoga helps.  . Cancer ([place])    skin  . Cataract   . Complex partial seizure ([place])   . Crohn's disease of small and large intestines ([place]) 1999  . Depression   . Diabetes mellitus   . Esophageal stricture   . External hemorrhoids   . GERD (gastroesophageal reflux disease)   . History of kidney stones    3 large stones still present  . History of nuclear stress test 11/15/2010   exercise myoview; normal pattern of perfusion; normal, low risk study   . Hyperlipemia   .  Hypertension   . Migraines   . MRSA (methicillin resistant Staphylococcus aureus) 10/2010   . Osteoporosis   . Pneumonia   . PONV (postoperative nausea and vomiting)   . Seizures ([place])   . Skin cancer    squamous cell multiple; [place]; followed every 3 months.  . Sleep apnea    CPAP machine, uses nightly    Current medications:  Outpatient Encounter Prescriptions as of 02/06/2017  Medication Sig  . aspirin EC 81 MG tablet Take 81 mg by mouth daily.  Marland Kitchen azelastine (ASTELIN) 0.1 % nasal spray Place 2 sprays into both nostrils 2 (two) times daily. Use in each nostril as directed  . calcium carbonate (CALCI-CHEW) 1250 (500 Ca) MG chewable tablet Chew 2 tablets by mouth daily.  . cholecalciferol (VITAMIN D) 1000 units tablet Take 2,000 Units by mouth daily.  Marland Kitchen diltiazem (TIAZAC) 180 MG 24 hr capsule Take 1 capsule (180 mg total) by mouth daily.  Marland Kitchen eletriptan (RELPAX) 40 MG tablet Take 1 tablet at onset of headache, may repeat in 2 hours if headache persists or reoccurs. (Patient taking differently: Take 40 mg by mouth every 2 (two) hours as needed. Take 1 tablet at onset of headache, may repeat in 2 hours if headache persists or reoccurs.)  . esomeprazole (NEXIUM) 40 MG capsule TAKE 1 CAPSULE TWICE A DAY  . Eszopiclone 3 MG TABS Take 1 tablet (3 mg total) by mouth at bedtime. Take immediately before bedtime  . ferrous sulfate 325 (65 FE) MG tablet Take 325 mg by mouth daily with breakfast.  . folic acid (FOLVITE) 1 MG tablet Take 2 tablets (2 mg total) by mouth daily.  Marland Kitchen glucose blood (CHOICE DM FORA G20 TEST STRIPS) test strip Use as instructed  . Ibuprofen-Diphenhydramine Cit (ADVIL PM PO) Take 2 tablets by mouth at bedtime.  . Lacosamide (VIMPAT) 150 MG TABS Take 1 tablet (150 mg total) by mouth 2 (two) times daily.  . Lactase (DAIRY-RELIEF PO) Take 2 tablets by mouth 2 (two) times daily.   Marland Kitchen loratadine (CLARITIN) 10 MG tablet Take 10 mg by mouth daily.  . mercaptopurine (PURINETHOL) 50  MG tablet TAKE 2 TABLETS DAILY ON AN EMPTY STOMACH 1 HOUR BEFOREOR 2 HOURS AFTER MEALS     (CAUTION: CHEMOTHERAPY)  . mesalamine (LIALDA) 1.2 g EC tablet Take 2 tablets (2.4 g total) by mouth daily.  . metFORMIN (GLUCOPHAGE) 500 MG tablet TAKE 1 TABLET EVERY EVENING  . methscopolamine (PAMINE FORTE) 5 MG tablet Take 1 tablet (5 mg total) by mouth 2 (two) times daily.  . ONE TOUCH ULTRA TEST  test strip USE AS INSTRUCTED  . ONETOUCH DELICA LANCETS 63S MISC USE AS INSTRUCTED (NEEDS OFFICE VISIT)  . potassium chloride SA (K-DUR,KLOR-CON) 20 MEQ tablet Take 1 tablet (20 mEq total) by mouth 2 (two) times daily.  . Probiotic Product (PROBIOTIC PO) Take 1 tablet by mouth daily.  . sertraline (ZOLOFT) 100 MG tablet Take 1 tablet (100 mg total) by mouth daily.   Facility-Administered Encounter Medications as of 02/06/2017  Medication  . gadopentetate dimeglumine (MAGNEVIST) injection 20 mL    Current Examination:  Behavioral Observations:  Appearance: Casually and appropriately dressed and groomed Gait: Ambulated independently, mild unsteadiness Speech: Fluent; normal rate, rhythm and volume. Verbose. No significant word finding difficulty observed during conversational speech. Thought process: Linear  Affect: Full, anxious Interpersonal: Pleasant, appropriate Orientation: Oriented to person, place, month and day of the week. Patient reported incorrect age (off by one year) and incorrect year (2017). Reported he did not know the date. Accurately named the current President and his predecessor.    Tests Administered: . Test of Premorbid Functioning (TOPF) . Wechsler Adult Intelligence Scale-Fourth Edition (WAIS-IV): Similarities, Information, Block Design, Matrix Reasoning, Arithmetic, Symbol Search, Coding and Digit Span subtests . Wechsler Memory Scale-Fourth Edition (WMS-IV) Adult Version (ages 55-69): Logical Memory I, II and Recognition subtests  . [employer] Verbal Learning Test - 2nd  Edition (CVLT-2) Short Form . Repeatable Battery for the Assessment of Neuropsychological Status (RBANS) Form A:  Figure Copy and Figure Recall subtests and Semantic Fluency subtest . Controlled Oral Word Association Test (COWAT) . Trail Making Test A and B . Boston Diagnostic Aphasia Examination (BDAE): Complex Ideational Material Subtest . Boston Naming Test (BNT) . Beck Depression Inventory - Second edition (BDI-II) . Personality Assessment Inventory (PAI)  Test Results: Standardized scores are presented only for use by appropriately trained professionals and to allow for any future test-retest comparison. These scores should not be interpreted without consideration of all the information that is contained in the rest of the report. The most recent standardization samples from the test publisher or other sources were used whenever possible to derive standard scores; scores were corrected for age, gender, ethnicity and education when available.  Note: The following test performances may not provide a completely valid estimate of Mr. Doss's current neuropsychological functioning as there was evidence of sub-optimal effort to engage in the cognitive tests. True abilities are thought to be of at least the level reported here and deficits cannot be assumed to be genuine.   Test Scores:  Test Name Raw Score Standardized Score Descriptor  TOPF 39/70 SS= 98 Average  WAIS-IV Subtests     Similarities 18/36 ss= 7 Low average  Information 13/26 ss= 9 Average  Block Design 20/66 ss= 6 Low average  Matrix Reasoning 13/26 ss= 9 Average  Arithmetic 10/22 ss= 7 Low average  Symbol Search 14/60 ss= 4 Impaired  Coding 30/135 ss= 4 Impaired  Digit Span 21/48 ss= 7 Low average  WAIS-IV Index Scores     Verbal Comprehension  SS= 89 Low average  Perceptual Reasoning  SS= 86 Low average  Working Memory  SS= 83 Low average  Processing Speed  SS= 68 Extremely low  Full Scale IQ (8 subtest)  SS= 77  Borderline  WMS-IV Subtests     LM I 18/50 ss= 7 Low average  LM II 20/50 ss= 10 Average  LM II Recognition 23/30 Cum %: 26-50 WNL  CVLT-II Scores     Trial 1 5/9 Z= -1 Low average  Trial 4 9/9 Z= 1 High average  Trials 1-4 total 29/36 T= 56 Average  SD Free Recall 7/9 Z= 0 Average  LD Free Recall 6/9 Z= 0 Average  LD Cued Recall 7/9 Z= 0 Average  Recognition Discriminability 7/9 hits, 5 false positives Z= -1 Low average  Forced Choice Recognition 9/9  WNL  RBANS Subtests     Figure Copy 17/20 Z= -0.9 Low average  Figure Recall 6/20 Z= -2.3 Impaired  Semantic Fluency 13 Z= -1.6 Borderline  COWAT-FAS 21 T= 29 Impaired  COWAT-Animals 17 T= 41 Low average  Trail Making Test A  87" 0 errors T= 1 Severely impaired  Trail Making Test B  135" 0 errors T= 26 Impaired  BDAE Complex Ideational Material 9/12  Impaired  BNT 42/60 T= 13 Severely impaired  BDI-II 39/63  Severe  PAI (Only elevated clinical scales are shown here)     NIM  T= 73   SOM  T= 95   ANX  T= 87   ARD  T= 86   DEP  T= 91   PAR  T= 75   SCZ  T= 90   SUI  T= 101   NON  T= 75      Description of Test Results:  Behavioral observations were suggestive of suboptimal levels of effort, and he performed poorly on a stand-alone test of memory malingering. As such, the patient's current performance on neurocognitive testing may not accurately reflect his true cognitive abilities, and results of neuropsychological testing cannot be interpreted. Nonetheless, it should be noted that the patient performed within normal limits on tests of verbal intelligence, verbal memory (encoding, consolidation and retrieval) and nonverbal abstract reasoning, suggesting that at least these areas of cognition are relatively intact.   On a self-report measure of mood (BDI-II), he endorsed symptoms consistent with a major depressive episode in the severe range.   On a more extensive measure of psychopathology and personality functioning  (PAI), symptom validity indicators suggested that the patient endorsed items that present an unfavorable impression.  This result raises the possibility of some (perhaps intentional) exaggeration of complaints and problems - patterns of this type are relatively infrequent among bona fide clinical patients. Although this pattern does not necessarily indicate a level of distortion that would render the test results uninterpretable, the interpretive hypotheses presented in this report may over-represent the extent and degree of significant test findings in certain areas.  The PAI clinical profile is marked by significant elevations across several scales, indicating a broad range of clinical features and increasing the possibility of multiple diagnoses.  Given certain response tendencies previously noted, it is possible that the clinical scales may overrepresent or exaggerate the actual degree of psychopathology.  Nonetheless, profile patterns of this type are usually associated with marked distress and, unless there is extensive distortion or exaggeration of symptomatology, severe impairment in functioning is typically present.  The configuration of the clinical scales suggests a person who is reporting significant distress, with particular concerns about his physical functioning.  The patient sees his life as severely disrupted by a variety of physical problems.  These problems have left him unhappy, with little energy or enthusiasm for concentrating on important life tasks and little hope for improvement in the future.  His performance in important social roles has probably suffered as a result, and his lack of success in these roles serves as an additional source of stress. The patient demonstrates a degree of somatic concerns that is unusual  even in clinical samples.  Such a score suggests a ruminative preoccupation with physical functioning and health matters and severe impairment arising from somatic symptoms.   These somatic complaints are likely to be chronic and accompanied by fatigue and weakness that renders the patient incapable of performing even minimal role expectations.  He is likely to report that his daily functioning has been compromised by numerous and varied physical problems.  He feels that his health is not as good as that of his age peers and likely believes that his health problems are complex and difficult to treat successfully.  Physical complaints are likely to include symptoms of distress in several biological systems, including the neurological, gastrointestinal, and musculoskeletal systems.  The item endorsement pattern indicates that he reports symptoms consistent with both conversion and somatization disorders.  He is likely to be continuously concerned with his health status and physical problems.  His social interactions and conversations tend to focus on his health problems, and his self-image may be largely influenced by a belief that he is handicapped by his poor health. The patient reports a number of difficulties consistent with a significant depressive experience.  He is likely to be plagued by thoughts of worthlessness, hopelessness, and personal failure.  He admits openly to feelings of sadness, a loss of interest in normal activities, and a loss of sense of pleasure in things that were previously enjoyed.  He is likely to show a disturbance in sleep pattern, a decrease in level of energy and sexual interest, and a loss of appetite and/or weight.  Psychomotor slowing might also be expected. A number of aspects of the patient's self-description suggest marked peculiarities in thinking and experience at a level of severity unusual even in clinical samples.  These features are often associated with an active psychotic episode, with poor judgment and impairment in reality testing as hallmark characteristics.  It is likely that he experiences unusual perceptual events or full-blown  hallucinations as well as unusual ideas that may include magical thinking or delusional beliefs.  He is likely to be a socially isolated individual who has few interpersonal relationships that could be described as close and warm.  He may have limited social skills, with particular difficulty in interpreting the normal nuances of interpersonal behavior that provide the meaning to personal relationships.  His social isolation and detachment may serve to decrease a sense of discomfort that interpersonal contact fosters.  His thought processes are likely to be marked by confusion, distractibility, and difficulty concentrating, and he may experience his thoughts as blocked, withdrawn, or somehow influenced by others. The patient indicates that he is experiencing a discomforting level of anxiety and tension.  He is likely to be plagued by worry to the degree that his ability to concentrate and attend are significantly compromised.  Associates are likely to comment about his overconcern regarding issues and events over which he has no control.  Affectively, he feels a great deal of tension, has difficulty relaxing, and likely experiences fatigue as a result of high perceived stress.  Overt physical signs of tension and stress, such as sweaty palms, trembling hands, complaints of irregular heartbeats, and shortness of breath are also present. The patient indicates that he is experiencing specific fears or anxiety surrounding some situations.  The pattern of responses reveals that he is likely to display significant symptoms related to traumatic stress.  He has likely experienced a disturbing traumatic event in the past-an event that continues to distress him and produce recurrent  episodes of anxiety.  Whereas the item content of the PAI does not address specific causes of traumatic stress, possible traumatic events involve victimization (e.g., rape, abuse), combat experiences, life-threatening accidents, and natural  disasters. The patient's self-description indicates significant suspiciousness and hostility in his relations with others.  He is quite sensitive in his interactions with others and likely harbors strong feelings of resentment as a result of perceived slights and insults.  He is quick to feel that he is being treated inequitably and often holds grudges against others, even if the perceived affront is unintentional.  Consistent with the constellation of suspiciousness and resentment, he probably is seen by others as being quite hostile.  Working relationships with others are likely to be very strained, despite any efforts by others to demonstrate support and assistance. The patient describes himself as rather moody and others may view him as overly sensitive.  He may be dissatisfied with his more important relationships and uncertain about major life goals. According to the patient's self-report, he describes NO significant problems in the following areas: antisocial behavior; problems with empathy; unusually elevated mood or heightened activity.  Also, he reports NO significant problems with alcohol or drug abuse or dependence.  With respect to suicidal ideation, the patient reports experiencing intense and recurrent suicidal thoughts at a level typical of individuals placed on suicide precautions.  The potential for suicide should be evaluated regularly and appropriate interventions should be implemented without delay.  Ongoing follow-up regarding the details of his suicidal thoughts and the potential for suicidal behavior is warranted, as is an evaluation of his life circumstances and available support systems as potential mediating factors.   Clinical Impressions: Conversion disorder (psychogenic non-epileptic seizures). Poor effort on cognitive testing invalidates the findings, but there were several areas in the normal range which is reassuring. While cause of poor effort (and whether it was  intentional or not) is impossible to ascertain, it should be noted that his pattern of responding on a test of memory malingering was statistically most consistent with a group of patients asked to fake impairment.  On psychological testing, he reported a very high level of psychological distress, characterized by depression, frequent suicidal ideation, anxiety, and significant preoccupation with perceived poor health.  Based on the available clinical data, it is my opinion that the patient's cognitive deficits in daily life are most likely secondary to primary psychiatric disorder and not due to a neurologic disorder. He continues to meet diagnostic criteria for a conversion disorder, and there is certainly a possibility of other psychiatric diagnoses.   Recommendations/Plan: --Continue mental health treatment. He may benefit from seeing a psychiatrist for consultation (in addition to ongoing therapy). He and his wife will consider this recommendation and further discuss with his PCP.  --Education and support for his wife is also recommended, related to coping with her husband's conversion disorder.    Feedback to Patient: [name] [name] and his wife returned for a feedback appointment on 02/06/2017 to review the results of his neuropsychological evaluation with this provider. 35 minutes face-to-face time was spent reviewing his test results, my impressions and my recommendations as detailed above.    Total time spent on this patient's case: [id number] unit for interview with psychologist; [phone] units of testing by psychometrician under psychologist's supervision; [phone] units for medical record review, scoring of neuropsychological tests, interpretation of test results, preparation of this report, and review of results to the patient by psychologist.      Thank you for your  referral of [name] [name]. Please feel free to contact me if you have any questions or concerns regarding this report.

## 2017-02-05 DIAGNOSIS — C44229 Squamous cell carcinoma of skin of left ear and external auricular canal: Secondary | ICD-10-CM | POA: Diagnosis not present

## 2017-02-06 ENCOUNTER — Encounter: Payer: Self-pay | Admitting: Psychology

## 2017-02-06 ENCOUNTER — Ambulatory Visit (INDEPENDENT_AMBULATORY_CARE_PROVIDER_SITE_OTHER): Payer: 59 | Admitting: Psychology

## 2017-02-06 DIAGNOSIS — F445 Conversion disorder with seizures or convulsions: Secondary | ICD-10-CM

## 2017-02-06 DIAGNOSIS — G40209 Localization-related (focal) (partial) symptomatic epilepsy and epileptic syndromes with complex partial seizures, not intractable, without status epilepticus: Secondary | ICD-10-CM | POA: Diagnosis not present

## 2017-02-06 DIAGNOSIS — R413 Other amnesia: Secondary | ICD-10-CM

## 2017-02-07 ENCOUNTER — Encounter: Payer: Self-pay | Admitting: Family Medicine

## 2017-02-11 ENCOUNTER — Encounter: Payer: Self-pay | Admitting: Family Medicine

## 2017-02-11 ENCOUNTER — Ambulatory Visit (INDEPENDENT_AMBULATORY_CARE_PROVIDER_SITE_OTHER): Payer: 59 | Admitting: Family Medicine

## 2017-02-11 VITALS — BP 119/77 | HR 77 | Temp 97.9°F | Resp 16 | Ht 71.65 in | Wt 212.0 lb

## 2017-02-11 DIAGNOSIS — R413 Other amnesia: Secondary | ICD-10-CM | POA: Diagnosis not present

## 2017-02-11 DIAGNOSIS — D530 Protein deficiency anemia: Secondary | ICD-10-CM

## 2017-02-11 DIAGNOSIS — F445 Conversion disorder with seizures or convulsions: Secondary | ICD-10-CM

## 2017-02-11 DIAGNOSIS — G40209 Localization-related (focal) (partial) symptomatic epilepsy and epileptic syndromes with complex partial seizures, not intractable, without status epilepticus: Secondary | ICD-10-CM | POA: Diagnosis not present

## 2017-02-11 DIAGNOSIS — R569 Unspecified convulsions: Secondary | ICD-10-CM

## 2017-02-11 DIAGNOSIS — E119 Type 2 diabetes mellitus without complications: Secondary | ICD-10-CM

## 2017-02-11 DIAGNOSIS — F321 Major depressive disorder, single episode, moderate: Secondary | ICD-10-CM

## 2017-02-11 DIAGNOSIS — K5080 Crohn's disease of both small and large intestine without complications: Secondary | ICD-10-CM | POA: Diagnosis not present

## 2017-02-11 DIAGNOSIS — G4733 Obstructive sleep apnea (adult) (pediatric): Secondary | ICD-10-CM | POA: Diagnosis not present

## 2017-02-11 DIAGNOSIS — I10 Essential (primary) hypertension: Secondary | ICD-10-CM | POA: Diagnosis not present

## 2017-02-11 DIAGNOSIS — C44229 Squamous cell carcinoma of skin of left ear and external auricular canal: Secondary | ICD-10-CM | POA: Diagnosis not present

## 2017-02-11 DIAGNOSIS — Z9989 Dependence on other enabling machines and devices: Secondary | ICD-10-CM | POA: Diagnosis not present

## 2017-02-11 MED ORDER — ESZOPICLONE 3 MG PO TABS: 3.0000 mg | ORAL_TABLET | Freq: Every day | ORAL | 0 refills | Status: DC

## 2017-02-11 MED ORDER — ESZOPICLONE 3 MG PO TABS: 3.0000 mg | ORAL_TABLET | Freq: Every day | ORAL | 1 refills | Status: DC

## 2017-02-11 MED ORDER — SERTRALINE HCL 100 MG PO TABS: 150.0000 mg | ORAL_TABLET | Freq: Every day | ORAL | 1 refills | Status: DC

## 2017-02-11 NOTE — Progress Notes (Signed)
Subjective:    Patient ID: [name] [name], male    DOB: 1960/10/17, 56 y.o.   MRN: [id number]  02/11/2017  Hypertension (3 month follow-up); Depression; and Diabetes   HPI This 56 y.o. male presents for three month follow-up of DMII, hypertension, major depression, seizure disorder with pseudoseizure component.  Treatment at last visit included increasing Zoloft to 167m daily.  Decreased Metformin to 5029monce daily.  S/p neuropsychiatric evaluation and diagnosed with conversion disorder.  Psychiatric evaluation recommended.  Neuropsychiatrist feels that has suffered some traumatic event that has caused partial complex seizures with non-epileptic seizures versus "I am just faking it all".  Wife/[name] [name] was present; says that there are people who fake it all the time.  Pt does not fake passing out and breaking ribs.  At last visit, reported thinking [name] [name]/wife was his sister.  Wife was quite convinced.  Suggesting that pt see psychiatrist.  Thought [place].  Able to read and comprehend a short versus long story.  Sees therapist today/[name] [name] two years; really good.  Has discussed psychiatrist consultation in the past.  Appointment with Dr. YaKrista [name] 02/24/2017.  Did get a puppy; lots of scratches on arms.  Puppy is pal.  Specialty needs child.  Puppy very therapeutic until last week; has taken several steps backwards emotionally.  Current complex partial seizures twice per month.   Squamous cell carcinoma; L ear.  S/p Mohs surgery.  Fourth Mohs surgery.    Fasting sugars 75-90.  Decreased Metformin to 50031maily at last visit.   BP Readings from Last 3 Encounters:  02/11/17 119/77  11/19/16 128/77  10/22/16 136/86   Wt Readings from Last 3 Encounters:  02/11/17 212 lb (96.2 kg)  11/19/16 217 lb (98.4 kg)  10/22/16 221 lb 9.6 oz (100.5 kg)   Immunization History  Administered Date(s) Administered  . Influenza Split 03/24/2013, 03/24/2014, 03/26/2016  . Influenza Whole 03/24/2012    . Influenza,inj,Quad PF,6+ Mos 02/16/2015  . Pneumococcal Conjugate-13 07/07/2014  . Pneumococcal Polysaccharide-23 06/08/2007  . Tdap 05/15/2016  . Zoster 08/18/2014    Review of Systems  Constitutional: Negative for activity change, appetite change, chills, diaphoresis, fatigue and fever.  Respiratory: Negative for cough and shortness of breath.   Cardiovascular: Negative for chest pain, palpitations and leg swelling.  Gastrointestinal: Negative for abdominal pain, diarrhea, nausea and vomiting.  Endocrine: Negative for cold intolerance, heat intolerance, polydipsia, polyphagia and polyuria.  Skin: Negative for color change, rash and wound.  Neurological: Positive for dizziness, syncope and light-headedness. Negative for tremors, seizures, facial asymmetry, speech difficulty, weakness, numbness and headaches.  Psychiatric/Behavioral: Positive for dysphoric mood. Negative for sleep disturbance. The patient is not nervous/anxious.     Past Medical History:  Diagnosis Date  . Adenomatous colon polyp 10/1999  . Anxiety   . Arthritis    neck, yoga helps.  . Cancer ([facility]  skin  . Cataract   . Complex partial seizure ([facility] . Crohn's disease of small and large intestines ([facility]  . Depression   . Diabetes mellitus   . Esophageal stricture   . External hemorrhoids   . GERD (gastroesophageal reflux disease)   . History of kidney stones    3 large stones still present  . History of nuclear stress test 11/15/2010   exercise myoview; normal pattern of perfusion; normal, low risk study   . Hyperlipemia   . Hypertension   . Migraines   . MRSA (methicillin resistant Staphylococcus aureus) 10/2010   .  Osteoporosis   . Pneumonia   . PONV (postoperative nausea and vomiting)   . Seizures ([place])   . Skin cancer    squamous cell multiple; [facility]; followed every 3 months.  . Sleep apnea    CPAP machine, uses nightly   Past Surgical History:  Procedure Laterality Date  .  CATARACT EXTRACTION Bilateral   . ELBOW SURGERY  2012   elbow MRSA infection   . EYE SURGERY     cataracts removed, /w IOL  . INSERTION OF MESH N/A 07/01/2016   Procedure: INSERTION OF MESH;  Surgeon: [name], MD;  Location: [facility];  Service: General;  Laterality: N/A;  . SHOULDER SURGERY    . SINUS SURGERY WITH INSTATRAK    . TRANSTHORACIC ECHOCARDIOGRAM  02/22/2011   EF 55-65%; increased pattern of LVH with mild conc hypertrophy, abnormal relaxation & increased filling pressure (grade 2 diastolic dysfunction); atrial septum thickened (lipomatous hypertrophy)  . UMBILICAL HERNIA REPAIR N/A 07/01/2016   Procedure: UMBILICAL HERNIA REPAIR WITH MESH;  Surgeon: [name], MD;  Location: [facility];  Service: General;  Laterality: N/A;  . VASECTOMY     Allergies  Allergen Reactions  . Penicillins Hives     Has patient had a PCN reaction causing immediate rash, facial/tongue/throat swelling, SOB or lightheadedness with hypotension: # # # YES # # #  Has patient had a PCN reaction causing severe rash involving mucus membranes or skin necrosis: # # UNKNOWN # #  Has patient had a PCN reaction that required hospitalization  . # # NO # #  Has patient had a PCN reaction occurring within the last 10 years:  # # NO # #  If all of the above answers are "NO", then may proceed with Cephalosporin use.   . Sulfonamide Derivatives Hives  . Azithromycin Swelling    SWELLING REACTION UNSPECIFIED   . Beef-Derived Products     RED MEAT > UNSPECIFIED REACTION   . Milk-Related Compounds     DAIRY >UNSPECIFIED REACTION   . Dilaudid [Hydromorphone Hcl] Nausea And Vomiting    Social History   Social History  . Marital status: Married    Spouse name: [name] [name]  . Number of children: 2  . Years of education: college   Occupational History  . disabled     seizure disorder with memory impairment   Social History Main Topics  . Smoking status: Never Smoker  . Smokeless tobacco: Never Used  . Alcohol  use No  . Drug use: No  . Sexual activity: Yes    Birth control/ protection: Surgical     Comment: 1 partner in last 12 months   Other Topics Concern  . Not on file   Social History Narrative   Marital status: married x 32 years;       Lives:   lives at home with his family.      Children:  two children; no grandchildren.  Son is special needs at age 75yo; Cornelian Delaine with TOF.       Employment: unemployed; [employer] long term disability      Tobacco: none      Alcohol: none      Drugs: none      Exercise: unable to exercise due to frequent falls   Patient has a college education.   Patient is right-handed.   Patient drinks one cup of soda daily.   Family History  Problem Relation Age of Onset  . Colon polyps Father   .  Stroke Father   . Hyperlipidemia Father   . Hypertension Father   . Heart disease Mother        CABG at age 85  . Hyperlipidemia Mother   . Hypertension Mother   . Stroke Paternal Grandmother   . Stroke Paternal Grandfather   . Other Child        tetrology of fallot (cornealia deland syndrome)  . Colon cancer Neg Hx        Objective:    BP 119/77   Pulse 77   Temp 97.9 F (36.6 C) (Oral)   Resp 16   Ht 5' 11.65" (1.82 m)   Wt 212 lb (96.2 kg)   SpO2 96%   BMI 29.03 kg/m  Physical Exam  Constitutional: He is oriented to person, place, and time. He appears well-developed and well-nourished. No distress.  HENT:  Head: Normocephalic and atraumatic.  Right Ear: External ear normal.  Left Ear: External ear normal.  Nose: Nose normal.  Mouth/Throat: Oropharynx is clear and moist.  Eyes: Pupils are equal, round, and reactive to light. Conjunctivae and EOM are normal.  Neck: Normal range of motion. Neck supple. Carotid bruit is not present. No thyromegaly present.  Cardiovascular: Normal rate, regular rhythm, normal heart sounds and intact distal pulses.  Exam reveals no gallop and no friction rub.   No murmur heard. Pulmonary/Chest:  Effort normal and breath sounds normal. He has no wheezes. He has no rales.  Abdominal: Soft. Bowel sounds are normal. He exhibits no distension and no mass. There is no tenderness. There is no rebound and no guarding.  Lymphadenopathy:    He has no cervical adenopathy.  Neurological: He is alert and oriented to person, place, and time. No cranial nerve deficit. He exhibits normal muscle tone. Coordination normal.  Skin: Skin is warm and dry. No rash noted. He is not diaphoretic.  Psychiatric: His speech is normal and behavior is normal. Judgment and thought content normal. Cognition and memory are normal. He exhibits a depressed mood.  Nursing note and vitals reviewed.  Results for orders placed or performed in visit on 11/19/16  CBC with Differential/Platelet  Result Value Ref Range   WBC 4.9 3.4 - 10.8 x10E3/uL   RBC 3.48 (L) 4.14 - 5.80 x10E6/uL   Hemoglobin 12.6 (L) 13.0 - 17.7 g/dL   Hematocrit 36.7 (L) 37.5 - 51.0 %   MCV 106 (H) 79 - 97 fL   MCH 36.2 (H) 26.6 - 33.0 pg   MCHC 34.3 31.5 - 35.7 g/dL   RDW 17.0 (H) 12.3 - 15.4 %   Platelets 130 (L) 150 - 379 x10E3/uL   Neutrophils 68 Not Estab. %   Lymphs 16 Not Estab. %   Monocytes 9 Not Estab. %   Eos 5 Not Estab. %   Basos 1 Not Estab. %   Neutrophils Absolute 3.3 1.4 - 7.0 x10E3/uL   Lymphocytes Absolute 0.8 0.7 - 3.1 x10E3/uL   Monocytes Absolute 0.4 0.1 - 0.9 x10E3/uL   EOS (ABSOLUTE) 0.2 0.0 - 0.4 x10E3/uL   Basophils Absolute 0.1 0.0 - 0.2 x10E3/uL   Immature Granulocytes 1 Not Estab. %   Immature Grans (Abs) 0.0 0.0 - 0.1 x10E3/uL  Comprehensive metabolic panel  Result Value Ref Range   Glucose 123 (H) 65 - 99 mg/dL   BUN 16 6 - 24 mg/dL   Creatinine, Ser 0.95 0.76 - 1.27 mg/dL   GFR calc non Af Amer 90 >59 mL/min/1.73   GFR calc Af  Amer 104 >59 mL/min/1.73   BUN/Creatinine Ratio 17 9 - 20   Sodium 139 134 - 144 mmol/L   Potassium 3.8 3.5 - 5.2 mmol/L   Chloride 104 96 - 106 mmol/L   CO2 18 18 - 29 mmol/L    Calcium 9.1 8.7 - 10.2 mg/dL   Total Protein 7.1 6.0 - 8.5 g/dL   Albumin 4.1 3.5 - 5.5 g/dL   Globulin, Total 3.0 1.5 - 4.5 g/dL   Albumin/Globulin Ratio 1.4 1.2 - 2.2   Bilirubin Total 2.5 (H) 0.0 - 1.2 mg/dL   Alkaline Phosphatase 140 (H) 39 - 117 IU/L   AST 31 0 - 40 IU/L   ALT 22 0 - 44 IU/L  Hemoglobin A1c  Result Value Ref Range   Hgb A1c MFr Bld 5.3 4.8 - 5.6 %   Est. average glucose Bld gHb Est-mCnc 105 mg/dL   No results found. Depression screen [facility] 2/9 02/11/2017 11/19/2016 08/20/2016 05/15/2016 06/03/2015  Decreased Interest 1 0 3 2 0  Down, Depressed, Hopeless 1 0 3 3 0  PHQ - 2 Score 2 0 6 5 0  Altered sleeping 0 - 3 3 -  Tired, decreased energy 1 - 1 1 -  Change in appetite 1 - 3 2 -  Feeling bad or failure about yourself  1 - 3 3 -  Trouble concentrating 1 - 3 3 -  Moving slowly or fidgety/restless 0 - 3 3 -  Suicidal thoughts 0 - 3 3 -  PHQ-9 Score 6 - 25 23 -  Difficult doing work/chores - - Very difficult - -  Some recent data might be hidden   Fall Risk  02/11/2017 11/19/2016 08/20/2016 05/15/2016 11/14/2015  Falls in the past year? Yes Yes No Yes Yes  Number falls in past yr: 2 or more 2 or more - 2 or more 2 or more  Injury with Fall? No Yes - Yes No  Comment - - - fracture rib #ll 05/14/16 -  Risk Factor Category  High Fall Risk - - - -        Assessment & Plan:   1. Type 2 diabetes mellitus without complication, without long-term current use of insulin ([place])   2. Essential hypertension   3. OSA on CPAP   4. Crohn's disease of both small and large intestine without complication ([place])   5. Partial symptomatic epilepsy with complex partial seizures, not intractable, without status epilepticus ([place])   6. Current moderate episode of major depressive disorder without prior episode ([place])   7. Nonepileptic episode ([place])   8. Memory loss   9. Anemia due to protein deficiency   10. Squamous cell carcinoma, ear, left   11. Conversion disorder with seizures or  convulsions    -s/p recent neuropsychiatric evaluation that suggested conversion disorder related with seizures +/- pseudoseizures.  Recommended psychiatry consultation and patient agreeable.  Also recommend seeing psychologist weekly or atleast every two weeks during this very difficult diagnosis and process for the patient and family.  Counseling provided during the visit. Depression had improved from last visit until receiving this recent dx of conversion disorder; contracted safety with patient in office today.  Increase Zoloft to 134m daily for now. -s/p resection of squamous cell on LEFT ear.  Bandage present. -stop Metformin.  Check sugars once daily for the next three months. -obtain labs for chronic disease management. -prolonged face-to-face for 40 minutes with greater than 50% of time dedicated to counseling and coordination of care.  Orders Placed This Encounter  Procedures  . CBC with Differential/Platelet  . Comprehensive metabolic panel    Order Specific Question:   Has the patient fasted?    Answer:   Yes  . Hemoglobin A1c  . Lipid panel    Order Specific Question:   Has the patient fasted?    Answer:   Yes  . Vitamin B12  . VITAMIN D 25 Hydroxy (Vit-D Deficiency, Fractures)  . Iron  . Ambulatory referral to Psychiatry    Referral Priority:   Routine    Referral Type:   Psychiatric    Referral Reason:   Specialty Services Required    Requested Specialty:   Psychiatry    Number of Visits Requested:   1   Meds ordered this encounter  Medications  . DISCONTD: Eszopiclone 3 MG TABS    Sig: Take 1 tablet (3 mg total) by mouth at bedtime. Take immediately before bedtime    Dispense:  30 tablet    Refill:  0  . Eszopiclone 3 MG TABS    Sig: Take 1 tablet (3 mg total) by mouth at bedtime. Take immediately before bedtime    Dispense:  90 tablet    Refill:  1  . sertraline (ZOLOFT) 100 MG tablet    Sig: Take 1.5 tablets (150 mg total) by mouth daily.    Dispense:  135  tablet    Refill:  1    No Follow-up on file.   Elantra Caprara Elayne Guerin, M.D. Primary Care at [facility] previously [facility] [address] [place], [place]  [address] [phone] phone [phone] fax

## 2017-02-11 NOTE — Patient Instructions (Addendum)
  STOP METFORMIN 500MG. CONTINUE TO CHECK SUGARS ONCE DAILY FOR THE NEXT THREE MONTHS.    IF you received an x-ray today, you will receive an invoice from [facility]. Please contact [facility] at [phone] with questions or concerns regarding your invoice.   IF you received labwork today, you will receive an invoice from [facility]. Please contact [facility] at [phone] with questions or concerns regarding your invoice.   Our billing staff will not be able to assist you with questions regarding bills from these companies.  You will be contacted with the lab results as soon as they are available. The fastest way to get your results is to activate your My Chart account. Instructions are located on the last page of this paperwork. If you have not heard from [place] regarding the results in 2 weeks, please contact this office.

## 2017-02-12 LAB — CBC WITH DIFFERENTIAL/PLATELET
Basophils Absolute: 0 10*3/uL (ref 0.0–0.2)
Basos: 1 %
EOS (ABSOLUTE): 0.1 10*3/uL (ref 0.0–0.4)
EOS: 3 %
HEMATOCRIT: 37 % — AB (ref 37.5–51.0)
Hemoglobin: 12.9 g/dL — ABNORMAL LOW (ref 13.0–17.7)
Immature Grans (Abs): 0 10*3/uL (ref 0.0–0.1)
Immature Granulocytes: 0 %
Lymphocytes Absolute: 0.9 10*3/uL (ref 0.7–3.1)
Lymphs: 27 %
MCH: 37.1 pg — ABNORMAL HIGH (ref 26.6–33.0)
MCHC: 34.9 g/dL (ref 31.5–35.7)
MCV: 106 fL — ABNORMAL HIGH (ref 79–97)
MONOS ABS: 0.2 10*3/uL (ref 0.1–0.9)
Monocytes: 7 %
Neutrophils Absolute: 2.1 10*3/uL (ref 1.4–7.0)
Neutrophils: 62 %
Platelets: 114 10*3/uL — ABNORMAL LOW (ref 150–379)
RBC: 3.48 x10E6/uL — ABNORMAL LOW (ref 4.14–5.80)
RDW: 16.4 % — AB (ref 12.3–15.4)
WBC: 3.4 10*3/uL (ref 3.4–10.8)

## 2017-02-12 LAB — COMPREHENSIVE METABOLIC PANEL
A/G RATIO: 1.6 (ref 1.2–2.2)
ALBUMIN: 4.2 g/dL (ref 3.5–5.5)
ALT: 24 IU/L (ref 0–44)
AST: 40 IU/L (ref 0–40)
Alkaline Phosphatase: 152 IU/L — ABNORMAL HIGH (ref 39–117)
BILIRUBIN TOTAL: 3.4 mg/dL — AB (ref 0.0–1.2)
BUN / CREAT RATIO: 14 (ref 9–20)
BUN: 12 mg/dL (ref 6–24)
CO2: 20 mmol/L (ref 20–29)
Calcium: 9.1 mg/dL (ref 8.7–10.2)
Chloride: 108 mmol/L — ABNORMAL HIGH (ref 96–106)
Creatinine, Ser: 0.87 mg/dL (ref 0.76–1.27)
GFR calc non Af Amer: 97 mL/min/{1.73_m2} (ref >59)
GFR, EST AFRICAN AMERICAN: 112 mL/min/{1.73_m2} (ref >59)
GLOBULIN, TOTAL: 2.7 g/dL (ref 1.5–4.5)
Glucose: 107 mg/dL — ABNORMAL HIGH (ref 65–99)
Potassium: 3.9 mmol/L (ref 3.5–5.2)
SODIUM: 143 mmol/L (ref 134–144)
TOTAL PROTEIN: 6.9 g/dL (ref 6.0–8.5)

## 2017-02-12 LAB — VITAMIN B12: VITAMIN B 12: 1983 pg/mL — AB (ref 232–1245)

## 2017-02-12 LAB — LIPID PANEL
Chol/HDL Ratio: 2.3 ratio (ref 0.0–5.0)
Cholesterol, Total: 144 mg/dL (ref 100–199)
HDL: 64 mg/dL (ref >39)
LDL CALC: 60 mg/dL (ref 0–99)
Triglycerides: 100 mg/dL (ref 0–149)
VLDL CHOLESTEROL CAL: 20 mg/dL (ref 5–40)

## 2017-02-12 LAB — HEMOGLOBIN A1C
ESTIMATED AVERAGE GLUCOSE: 88 mg/dL
Hgb A1c MFr Bld: 4.7 % — ABNORMAL LOW (ref 4.8–5.6)

## 2017-02-12 LAB — VITAMIN D 25 HYDROXY (VIT D DEFICIENCY, FRACTURES): Vit D, 25-Hydroxy: 49.1 ng/mL (ref 30.0–100.0)

## 2017-02-12 LAB — IRON: Iron: 131 ug/dL (ref 38–169)

## 2017-02-18 ENCOUNTER — Other Ambulatory Visit (INDEPENDENT_AMBULATORY_CARE_PROVIDER_SITE_OTHER): Payer: 59

## 2017-02-18 ENCOUNTER — Ambulatory Visit (INDEPENDENT_AMBULATORY_CARE_PROVIDER_SITE_OTHER): Payer: 59 | Admitting: Gastroenterology

## 2017-02-18 ENCOUNTER — Encounter: Payer: Self-pay | Admitting: Gastroenterology

## 2017-02-18 VITALS — BP 102/64 | HR 88 | Ht 71.0 in | Wt 212.0 lb

## 2017-02-18 DIAGNOSIS — R945 Abnormal results of liver function studies: Secondary | ICD-10-CM

## 2017-02-18 DIAGNOSIS — R932 Abnormal findings on diagnostic imaging of liver and biliary tract: Secondary | ICD-10-CM

## 2017-02-18 DIAGNOSIS — R7989 Other specified abnormal findings of blood chemistry: Secondary | ICD-10-CM

## 2017-02-18 DIAGNOSIS — K921 Melena: Secondary | ICD-10-CM | POA: Diagnosis not present

## 2017-02-18 DIAGNOSIS — K50819 Crohn's disease of both small and large intestine with unspecified complications: Secondary | ICD-10-CM | POA: Diagnosis not present

## 2017-02-18 DIAGNOSIS — Z86010 Personal history of colonic polyps: Secondary | ICD-10-CM

## 2017-02-18 LAB — HEPATIC FUNCTION PANEL
ALBUMIN: 4.1 g/dL (ref 3.5–5.2)
ALK PHOS: 154 U/L — AB (ref 39–117)
ALT: 21 U/L (ref 0–53)
AST: 31 U/L (ref 0–37)
BILIRUBIN DIRECT: 0.7 mg/dL — AB (ref 0.0–0.3)
TOTAL PROTEIN: 7 g/dL (ref 6.0–8.3)
Total Bilirubin: 3.2 mg/dL — ABNORMAL HIGH (ref 0.2–1.2)

## 2017-02-18 LAB — IBC PANEL
IRON: 148 ug/dL (ref 42–165)
Saturation Ratios: 43.9 % (ref 20.0–50.0)
Transferrin: 241 mg/dL (ref 212.0–360.0)

## 2017-02-18 LAB — FERRITIN: FERRITIN: 235.8 ng/mL (ref 22.0–322.0)

## 2017-02-18 LAB — PROTIME-INR
INR: 1.2 ratio — ABNORMAL HIGH (ref 0.8–1.0)
Prothrombin Time: 13 s (ref 9.6–13.1)

## 2017-02-18 MED ORDER — MESALAMINE 1.2 G PO TBEC: 2.4000 g | DELAYED_RELEASE_TABLET | Freq: Every day | ORAL | 1 refills | Status: DC

## 2017-02-18 MED ORDER — MERCAPTOPURINE 50 MG PO TABS: ORAL_TABLET | ORAL | 1 refills | Status: DC

## 2017-02-18 MED ORDER — NA SULFATE-K SULFATE-MG SULF 17.5-3.13-1.6 GM/177ML PO SOLN: 1.0000 | Freq: Once | ORAL | 0 refills | Status: AC

## 2017-02-18 MED ORDER — METHSCOPOLAMINE BROMIDE 5 MG PO TABS: 5.0000 mg | ORAL_TABLET | Freq: Two times a day (BID) | ORAL | 1 refills | Status: DC

## 2017-02-18 NOTE — Progress Notes (Signed)
    History of Present Illness: This is a 56 year old male with Crohn's disease. Last colonoscopy 11/2013 with 3 adenomatous polyps, diverticulosis and internal hemorrhoids. He states he has about 3 loose stools per day and over the past week has noted small amounts of bright red blood per rectum with bowel movements. Lab review showed several abnormalities. CBC Hb=12.9, MCV=106, plts=114, alk phos=152, t bili=3.4. Review of CT scan ordered by Dr. Zella Richer in January 2018  below.  Abd/pelvic CT 07/22/2016 IMPRESSION: 1. Anterior abdominal wall low-density collection which could represent a postoperative seroma or chronic hematoma. No findings to suggest abscess. 2. Cirrhosis. 3. Left nephrolithiasis. 4.  Aortic atherosclerosis. 5.  Possible constipation Current Medications, Allergies, Past Medical History, Past Surgical History, Family History and Social History were reviewed in [employer] record.  Physical Exam: General: Well developed, well nourished, no acute distress Head: Normocephalic and atraumatic Eyes:  sclerae anicteric, EOMI Ears: Normal auditory acuity Mouth: No deformity or lesions Lungs: Clear throughout to auscultation Heart: Regular rate and rhythm; no murmurs, rubs or bruits Abdomen: Soft, non tender and non distended. Left hepatic lobe palpable 2 FB below costal margin. No masses, splenomegaly or hernias noted. Incision at umbilicus is well healed. Normal Bowel sounds Rectal: deferred to colonoscopy Musculoskeletal: Symmetrical with no gross deformities  Pulses:  Normal pulses noted Extremities: No clubbing, cyanosis, edema or deformities noted Neurological: Alert oriented x 4, grossly nonfocal Psychological:  Alert and cooperative. Normal mood and affect  Assessment and Recommendations:  1. Cirrhosis by CT with elevated alkaline phosphatase & total bilirubin and decreased platelets-this is a new diagnosis. History of hepatic steatosis. Repeat  LFTs today with direct and indirect bilirubin. Obtain standard hepatic serologies and PT/INR today. 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 RUQ [place].    2. Crohn's ileocolitis. 6MP 100 mg daily and Lialda 2.4 g daily. Colonoscopy as below.  3. Macrocytic anemia. Follow up with PCP.   4. S/P umbilical hernia.   5. Personal history of adenomatous colon polyps. Schedule colonoscopy. The risks (including bleeding, perforation, infection, missed lesions, medication reactions and possible hospitalization or surgery if complications occur), benefits, and alternatives to colonoscopy with possible biopsy and possible polypectomy were discussed with the patient and they consent to proceed.   6. Small volume hematochezia likely secondary to hemorrhoids. Colonoscopy as above. Preparation H suppositories at bedtime for 5 days and then daily when necessary

## 2017-02-18 NOTE — Patient Instructions (Signed)
Your physician has requested that you go to the basement for lab work before leaving today.  We have sent the following medications to your pharmacy for you to pick up at your convenience: mercaptopurine, Lialda and pamine.   You have been scheduled for an abdominal ultrasound at [facility] (1st floor of hospital) on 02/25/17 at 10:00am. Please arrive 15 minutes prior to your appointment for registration. Make certain not to have anything to eat or drink 6 hours prior to your appointment. Should you need to reschedule your appointment, please contact radiology at [phone]. This test typically takes about 30 minutes to perform.  You have been scheduled for a colonoscopy. Please follow written instructions given to you at your visit today.  Please pick up your prep supplies at the pharmacy within the next 1-3 days. If you use inhalers (even only as needed), please bring them with you on the day of your procedure. Your physician has requested that you go to www.startemmi.com and enter the access code given to you at your visit today. This web site gives a general overview about your procedure. However, you should still follow specific instructions given to you by our office regarding your preparation for the procedure.  Normal BMI (Body Mass Index- based on height and weight) is between 19 and 25. Your BMI today is Body mass index is 29.57 kg/m. Marland Kitchen Please consider follow up  regarding your BMI with your Primary Care Provider.  Thank you for choosing me and [facility].  Pricilla Riffle. Dagoberto Ligas., MD., Mar[facility] Regal

## 2017-02-19 LAB — HEPATITIS C ANTIBODY: HCV Ab: NONREACTIVE

## 2017-02-19 LAB — MITOCHONDRIAL ANTIBODIES: Mitochondrial M2 Ab, IgG: <=20 Units (ref <=20.0)

## 2017-02-19 LAB — HEPATITIS B SURFACE ANTIGEN: Hepatitis B Surface Ag: NONREACTIVE

## 2017-02-19 LAB — HEPATITIS B SURFACE ANTIBODY,QUALITATIVE: Hep B S Ab: NONREACTIVE

## 2017-02-19 LAB — ANA: Anti Nuclear Antibody(ANA): NEGATIVE

## 2017-02-20 LAB — ANTI-SMOOTH MUSCLE ANTIBODY, IGG: Smooth Muscle Ab: <20 U (ref <20)

## 2017-02-20 LAB — ALPHA-1-ANTITRYPSIN: A-1 Antitrypsin, Ser: 206 mg/dL — ABNORMAL HIGH (ref 83–199)

## 2017-02-20 LAB — CERULOPLASMIN: CERULOPLASMIN: 23 mg/dL (ref 18–36)

## 2017-02-25 ENCOUNTER — Ambulatory Visit: Payer: 59 | Admitting: Neurology

## 2017-02-25 ENCOUNTER — Ambulatory Visit (HOSPITAL_COMMUNITY)
Admission: RE | Admit: 2017-02-25 | Discharge: 2017-02-25 | Disposition: A | Discharge status: Home / Self Care | Payer: 59 | Source: Ambulatory Visit | Attending: Gastroenterology | Admitting: Gastroenterology

## 2017-02-25 DIAGNOSIS — R7989 Other specified abnormal findings of blood chemistry: Secondary | ICD-10-CM

## 2017-02-25 DIAGNOSIS — L720 Epidermal cyst: Secondary | ICD-10-CM | POA: Diagnosis not present

## 2017-02-25 DIAGNOSIS — R932 Abnormal findings on diagnostic imaging of liver and biliary tract: Secondary | ICD-10-CM | POA: Diagnosis not present

## 2017-02-25 DIAGNOSIS — R945 Abnormal results of liver function studies: Secondary | ICD-10-CM | POA: Insufficient documentation

## 2017-02-25 DIAGNOSIS — K7460 Unspecified cirrhosis of liver: Secondary | ICD-10-CM | POA: Diagnosis not present

## 2017-02-25 DIAGNOSIS — C4402 Squamous cell carcinoma of skin of lip: Secondary | ICD-10-CM | POA: Diagnosis not present

## 2017-02-25 DIAGNOSIS — C44729 Squamous cell carcinoma of skin of left lower limb, including hip: Secondary | ICD-10-CM | POA: Diagnosis not present

## 2017-03-10 ENCOUNTER — Encounter: Payer: Self-pay | Admitting: Family Medicine

## 2017-03-10 ENCOUNTER — Encounter: Payer: Self-pay | Admitting: Neurology

## 2017-03-11 ENCOUNTER — Encounter: Payer: Self-pay | Admitting: Neurology

## 2017-03-11 ENCOUNTER — Ambulatory Visit (INDEPENDENT_AMBULATORY_CARE_PROVIDER_SITE_OTHER): Payer: 59 | Admitting: Neurology

## 2017-03-11 VITALS — BP 133/81 | HR 90 | Ht 71.0 in | Wt 212.0 lb

## 2017-03-11 DIAGNOSIS — Z9989 Dependence on other enabling machines and devices: Secondary | ICD-10-CM

## 2017-03-11 DIAGNOSIS — G4733 Obstructive sleep apnea (adult) (pediatric): Secondary | ICD-10-CM

## 2017-03-11 DIAGNOSIS — F341 Dysthymic disorder: Secondary | ICD-10-CM

## 2017-03-11 DIAGNOSIS — F329 Major depressive disorder, single episode, unspecified: Secondary | ICD-10-CM

## 2017-03-11 NOTE — Progress Notes (Signed)
[facility]  PATIENT: [name] [name] DOB: 1960/10/20   REASON FOR VISIT: follow up for "seizure disorder", OSA on CPAP  , here in the sleep clinic.  Followed by dr Krista Blue for  non-epileptic episodes and new complaint of worsening memory  HISTORY FROM: Patient   Interval history from 03/11/2017, I have the pleasure of seeing Mrs. by the once a year for compliance visit and his CPAP compliance is excellent. He is used to machine 97% of the time over 4 hours 100% of the last 30 days, his average user time of 6 hours 27 minutes the machine is set at 10 cm pressure this 2 cm expiratory pressure relief. His residual AHI is 2.8 but he does have high air leaks. These however does not did not distort the apnea count. He also feels that without this CPAP he cannot sleep as well. His fatigue questionnaire was endorsed high at 47 points, his Epworth sleepiness score was endorsed at 21 points out of 24 possible points, also very high. He continues to feel depressed, and I was able to review Dr. Lynelle Smoke report which stated that the patient's major depression was interfering with some of the validity of cognitive testing. She felt that there was room for conversion disorder based on the patient's own feeling of worthlessness, hopelessness and sadness. [name] [name] reports that he had a seizure last night in spite of being compliant with his seizure medications.     [name] [name] presents today for his yearly compliance visit. He has used this machine 100% of the last 30 days over 4 hours, with an average of 7 hours and 34 minutes if that pressure is 10 cm water full-time EPR his residual AHI is 2.7 which is an excellent resolution of apnea. He does have occasional high air leaks. He reports that often his mask dislodges when he turns over at night. However this has not influenced his apnea index and as this remains low I am happy with his performance so far. If he would like to change to another  interface I will offer him that. He could not tolerate well just the nasal pillow so he is using a full face mask. It is sometimes difficult to achieve seal with a larger surface been covered.  (He may need to shave regularly.) He feels a lot better since using CPAP.     REVIEW OF SYSTEMS: Full 14 system review of systems performed and notable only for those listed, all others are neg:   The patient's Epworth sleepiness score is 21 points which is a high rating, fatigue severity is 47 which is equally high. He is in ongoing treatment for depression. Neurology Memory loss, seizure, headaches- Psychiatric Anxiety,  depressionSleep obstructive sleep apnea on CPAP   HOME MEDICATIONS: reviewed.    PAST MEDICAL HISTORY: see initial consult   PAST SURGICAL HISTORY: see initial consult   Past Surgical History:  Procedure Laterality Date  . CATARACT EXTRACTION Bilateral   . ELBOW SURGERY  2012   elbow MRSA infection   . EYE SURGERY     cataracts removed, /w IOL  . INSERTION OF MESH N/A 07/01/2016   Procedure: INSERTION OF MESH;  Surgeon: [name], MD;  Location: [place];  Service: General;  Laterality: N/A;  . SHOULDER SURGERY    . SINUS SURGERY WITH INSTATRAK    . TRANSTHORACIC ECHOCARDIOGRAM  02/22/2011   EF 55-65%; increased pattern of LVH with mild conc hypertrophy, abnormal relaxation & increased filling pressure (  grade 2 diastolic dysfunction); atrial septum thickened (lipomatous hypertrophy)  . UMBILICAL HERNIA REPAIR N/A 07/01/2016   Procedure: UMBILICAL HERNIA REPAIR WITH MESH;  Surgeon: [name], MD;  Location: [place];  Service: General;  Laterality: N/A;  . VASECTOMY      FAMILY HISTORY: Family History  Problem Relation Age of Onset  . Colon polyps Father   . Stroke Father   . Hyperlipidemia Father   . Hypertension Father   . Heart disease Mother        CABG at age 44  . Hyperlipidemia Mother   . Hypertension Mother   . Stroke Paternal Grandmother   . Stroke  Paternal Grandfather   . Other Child        tetrology of fallot (cornealia deland syndrome)  . Colon cancer Neg Hx    CORNELIA DE LANGE SYNDROME - child    SOCIAL HISTORY: Social History   Social History  . Marital status: Married    Spouse name: [name]  . Number of children: 2  . Years of education: college   Occupational History  . disabled     seizure disorder with memory impairment   Social History Main Topics  . Smoking status: Never Smoker  . Smokeless tobacco: Never Used  . Alcohol use No  . Drug use: No  . Sexual activity: Yes    Birth control/ protection: Surgical     Comment: 1 partner in last 12 months   Other Topics Concern  . Not on file   Social History Narrative   Marital status: married x 32 years;       Lives:   lives at home with his family.      Children:  two children; no grandchildren.  Son is special needs at age 37yo; Cornelian Delaine with TOF.       Employment: unemployed; [employer] long term disability      Tobacco: none      Alcohol: none      Drugs: none      Exercise: unable to exercise due to frequent falls   Patient has a college education.   Patient is right-handed.   Patient drinks one cup of soda daily.     PHYSICAL EXAM  Vitals:   03/11/17 0830  BP: 133/81  Pulse: 90  Weight: 212 lb (96.2 kg)  Height: 5' 11"  (1.803 m)   Body mass index is 29.57 kg/m. Generalized: Well developed, obese male in no acute distress  Head: normocephalic and atraumatic,. Oropharynx benign  The patient has a neck circumference of 17-1/2 inches,  Mallampati grade 4, no sign of tongue bite. Neck: Supple, no carotid bruits  Cardiac: Regular rate rhythm, no murmur  Musculoskeletal: No deformity   Neurological examination   Mentation: Alert oriented to time, place, history taking. Attention span and concentration appropriate.  Cranial nerve;  Pupils were equal round reactive to light extraocular movements were full, visual field were  full on confrontational test. Facial sensation and strength were normal. hearing was intact to finger rubbing bilaterally. Uvula and tongue midline, head turning and shoulder shrug were normal and symmetric.Tongue protrusion into cheek strength was normal. Reflexes: Bilateral equal 2+. Gait and Station: Rising up from seated position without assistance, normal stance,  DIAGNOSTIC DATA (LABS, IMAGING, TESTING)  CPAP  download.   Lab Results  Component Value Date   WBC 3.4 02/11/2017   HGB 12.9 (L) 02/11/2017   HCT 37.0 (L) 02/11/2017   MCV 106 (H) 02/11/2017  PLT 114 (L) 02/11/2017      Component Value Date/Time   NA 143 02/11/2017 1229   K 3.9 02/11/2017 1229   CL 108 (H) 02/11/2017 1229   CO2 20 02/11/2017 1229   GLUCOSE 107 (H) 02/11/2017 1229   GLUCOSE 138 (H) 06/27/2016 0928   BUN 12 02/11/2017 1229   CREATININE 0.87 02/11/2017 1229   CREATININE 0.84 05/15/2016 1005   CALCIUM 9.1 02/11/2017 1229   PROT 7.0 02/18/2017 1044   PROT 6.9 02/11/2017 1229   ALBUMIN 4.1 02/18/2017 1044   ALBUMIN 4.2 02/11/2017 1229   AST 31 02/18/2017 1044   ALT 21 02/18/2017 1044   ALKPHOS 154 (H) 02/18/2017 1044   BILITOT 3.2 (H) 02/18/2017 1044   BILITOT 3.4 (H) 02/11/2017 1229   GFRNONAA 97 02/11/2017 1229   GFRNONAA >89 06/03/2015 0858   GFRAA 112 02/11/2017 1229   GFRAA >89 06/03/2015 0858   Lab Results  Component Value Date   CHOL 144 02/11/2017   HDL 64 02/11/2017   LDLCALC 60 02/11/2017   TRIG 100 02/11/2017   CHOLHDL 2.3 02/11/2017   Lab Results  Component Value Date   HGBA1C 4.7 (L) 02/11/2017   Lab Results  Component Value Date   VITAMINB12 1,983 (H) 02/11/2017   Lab Results  Component Value Date   TSH 2.180 08/20/2016      ASSESSMENT AND PLAN 56 y.o. year old right handed Caucasian married male patient , here for a 20 minute compliance visit dedicated to CPAP use and sleep apnea and its clinical impact only. Multiple 50% of the face-to-face time is used for  discussion of the results of Dr Geri Seminole recent testing. ,  CPAP download:  I pointed out the high air leak, but the consistently low AHI. Coordination of care is with Dr. Krista Blue and Nurse practitioner Hassell Done, primary care physician is Dr. Joseph Art. Hehas a past medical history of  OSA on CPAP, Hypertension;depression, Complex partial seizure ([place]); Migraines; Seizures ([place]); and pseudoseizures here to follow-up. Reviewed last Labs. He has increasing amnestic spells.    Need to follow up  for sleep issues- needs compliance visit once a year, can see NP.  This will not affect his seizure clinic visit with Dr Krista Blue.   Larey Seat, MD  [facility] [address] [place], [place] [address] [phone]   CC DR Gypsy Balsam, NP

## 2017-03-18 ENCOUNTER — Encounter: Payer: Self-pay | Admitting: Family Medicine

## 2017-03-19 ENCOUNTER — Telehealth: Payer: Self-pay | Admitting: Family Medicine

## 2017-03-19 NOTE — Telephone Encounter (Signed)
Pt was seen by Dr. Tamala Julian back in august and she told him that she was going to call him in a rx for eszopiclone and he is about out and is needing that refill and it is not at the pharmacy I believe he has about 4 left   Best number [phone]

## 2017-03-20 ENCOUNTER — Encounter: Payer: Self-pay | Admitting: Family Medicine

## 2017-03-20 NOTE — Telephone Encounter (Signed)
MyChart message from pt re: refill of Eszopiclone Chart states it was ordered at [place] 8/21.  No notes if it was faxed, etc.  Pt states they did not receive. IC, spoke with pharmacist PK - she confirmed they did not receive.  I verbally gave prescription as listed in 8/21 [facility].  Pharmacist states it will take 5-7 days for refill.  Pt needs interim rx sent to local pharmacy  [facility] [place].

## 2017-03-21 ENCOUNTER — Telehealth: Payer: Self-pay | Admitting: *Deleted

## 2017-03-21 ENCOUNTER — Telehealth: Payer: Self-pay

## 2017-03-21 MED ORDER — ESZOPICLONE 3 MG PO TABS: 3.0000 mg | ORAL_TABLET | Freq: Every day | ORAL | 1 refills | Status: DC

## 2017-03-21 NOTE — Telephone Encounter (Signed)
Per Dr. Miles Costain to refill rx Eszopiclone 3 mg #90  [facility], [address] [phone]

## 2017-03-21 NOTE — Telephone Encounter (Signed)
Called in rx for eszopiclone.

## 2017-03-21 NOTE — Telephone Encounter (Signed)
Dr. Tamala Julian, per pharmacist pt picked up Eszopiclone 3 mg #30 on 02/20/17, I told her that today is 03/21/17, but she wanted to verify that it is ok for pt to get. Pls advise

## 2017-03-21 NOTE — Telephone Encounter (Signed)
Please CALL IN rx for Eszopiclone 66m as [facility] [employer]

## 2017-03-22 ENCOUNTER — Other Ambulatory Visit: Payer: Self-pay | Admitting: Family Medicine

## 2017-03-22 ENCOUNTER — Telehealth: Payer: Self-pay | Admitting: Family Medicine

## 2017-03-22 NOTE — Telephone Encounter (Signed)
Pt.  Called to inform the practice that [facility] did not have his prescription. I checked the phone messages and saw that it was sent in. He has checked with the pharmacy multiple times after the alleged sent date. After checking his medication list I saw that the medication order was "print". I have checked back with Dr. Thompson Caul CMA (03/22/2017) and she is waiting for the Dr to work out the best course of action.

## 2017-03-24 NOTE — Telephone Encounter (Signed)
Called pharmacy and verified the pt could pick up Rx sent to pharmacy.

## 2017-03-24 NOTE — Telephone Encounter (Signed)
Please advise 

## 2017-03-25 NOTE — Telephone Encounter (Signed)
Spoke with [facility]---picked up 03/22/17 #5 from [facility].

## 2017-03-31 DIAGNOSIS — N390 Urinary tract infection, site not specified: Secondary | ICD-10-CM | POA: Diagnosis not present

## 2017-04-02 ENCOUNTER — Ambulatory Visit (INDEPENDENT_AMBULATORY_CARE_PROVIDER_SITE_OTHER): Payer: 59 | Admitting: Neurology

## 2017-04-02 ENCOUNTER — Encounter: Payer: Self-pay | Admitting: Neurology

## 2017-04-02 VITALS — BP 118/77 | HR 92 | Ht 71.0 in | Wt 209.0 lb

## 2017-04-02 DIAGNOSIS — R410 Disorientation, unspecified: Secondary | ICD-10-CM | POA: Insufficient documentation

## 2017-04-02 MED ORDER — LAMOTRIGINE 100 MG PO TABS: 100.0000 mg | ORAL_TABLET | Freq: Two times a day (BID) | ORAL | 11 refills | Status: DC

## 2017-04-02 MED ORDER — LAMOTRIGINE 25 MG PO TABS: ORAL_TABLET | ORAL | 0 refills | Status: DC

## 2017-04-02 NOTE — Progress Notes (Signed)
[facility]  PATIENT: [name] [name] DOB: November 29, 1960  HISTORY OF PRESENT ILLNESS: HISTORY: Mr. Shear is a 56 years old right-handed male, accompanied by his wife, was seen in refer by his primary care physician Dr. [name] for evaluation of seizure. He had a past medical history of diabetes, hypertension, Crohn's disease, on immunosuppressive treatment, He presented with seizure-like event, reported initial event was June 28 2014, he was found by his wife to difficulty to be awake and eyes closed, took a while for him to open his eyes, and become very confused, also angry, episodes last about 5-10 minutes, there was also witnessed leg extension, jerking movement, He had multiple recurrent confusion episodes later, MRI of the brain, MRA of the brain in April 2013 was normal, EEG showed intermittent left anterior and medial temporal lobe slowing He later was also diagnosed with severe obstructive sleep apnea, using CPAP machine UPDATE August 1 st 2016:YY He continue have recurrent episode while taking Vimpat 200 mg twice a day, and titrating dose of Keppra,  I have reviewed and summarized his video EEG monitoring from [facility] in June 17 to December 14 2014, patient reported different spells, 1, memory/sleepiness, preceded by headaches, often followed by right arm numbness, 2, staring and oral automatism, Video EEG monitoring was carried out during withdraw of the admission antiseizure medications, as well as sleep deprivation, for typical example of type I spells were recorded, with overall findings suggesting the diagnosis of psychogenic nonepileptic spells, is referred to psychotherapy, Keppra was tapered off,  Interictal EEG was normal, non-of the type II spells were recorded, clinical impression is that spell type II is likely to be focal seizure, he was advised to keep Vimpat, decreased the dosage from 200 mg twice a day to 100 mg twice a day, He is also  taking Lexapro 10 mg daily, which has helped his depression anxiety,  Update April 02 2017: Review neuropsychiatric evaluation by Dr. Bonita Quin in August 2018: Suboptimal level of effort, cause of poor effort is impossible to acertain,  but the pattern of his responding was statistically most consistent with patient asked to fake impairment.  He reported high level of psychiatric distress, depression, frequent suicidal ideation, anxiety, significant preoccupation with perceived poor health, the conclusion was patient's cognitive deficit in daily life most likely secondary to primary psychiatric disorders, but not due to neurological disorders, he continue to meet the diagnostic criteria for conversion disorder,  The suggestion was to continue psychiatric treatment,  He complains of sudden onset of confusion, got lost while walking his dog, lasting for 30 minutes, there was no seizure-like activity, multiple episodes in one week, he is taking Vimpat 150 mg twice a day   REVIEW OF SYSTEMS: Full 14 system review of systems performed and notable only for those listed, all others are neg:  Light sensitivity, abdominal pain, rectal bleeding, diarrhea, rectal pain, insomnia, daytime sleepiness, acting out of dreams, difficulty urinating, painful urination, urgency, warm, memory loss, dizziness, numbness, seizure weakness, passing out, confusion decreased concentration depression anxiety suicidal thoughts  ALLERGIES: Allergies  Allergen Reactions  . Penicillins Hives     Has patient had a PCN reaction causing immediate rash, facial/tongue/throat swelling, SOB or lightheadedness with hypotension: # # # YES # # #  Has patient had a PCN reaction causing severe rash involving mucus membranes or skin necrosis: # # UNKNOWN # #  Has patient had a PCN reaction that required hospitalization  . # # NO # #  Has patient had a PCN reaction occurring within the last 10 years:  # # NO # #  If all of the above  answers are "NO", then may proceed with Cephalosporin use.   . Sulfonamide Derivatives Hives  . Azithromycin Swelling    SWELLING REACTION UNSPECIFIED   . Beef-Derived Products     RED MEAT > UNSPECIFIED REACTION   . Milk-Related Compounds     DAIRY >UNSPECIFIED REACTION   . Dilaudid [Hydromorphone Hcl] Nausea And Vomiting    HOME MEDICATIONS: Outpatient Medications Prior to Visit  Medication Sig Dispense Refill  . aspirin EC 81 MG tablet Take 81 mg by mouth daily.    Marland Kitchen azelastine (ASTELIN) 0.1 % nasal spray Place 2 sprays into both nostrils 2 (two) times daily. Use in each nostril as directed 90 mL 3  . calcium carbonate (CALCI-CHEW) 1250 (500 Ca) MG chewable tablet Chew 2 tablets by mouth daily.    . cholecalciferol (VITAMIN D) 1000 units tablet Take 2,000 Units by mouth daily.    Marland Kitchen diltiazem (TIAZAC) 180 MG 24 hr capsule Take 1 capsule (180 mg total) by mouth daily. 90 capsule 3  . eletriptan (RELPAX) 40 MG tablet Take 1 tablet at onset of headache, may repeat in 2 hours if headache persists or reoccurs. (Patient taking differently: Take 40 mg by mouth every 2 (two) hours as needed. Take 1 tablet at onset of headache, may repeat in 2 hours if headache persists or reoccurs.) 12 tablet 4  . esomeprazole (NEXIUM) 40 MG capsule TAKE 1 CAPSULE TWICE A DAY 180 capsule 1  . Eszopiclone 3 MG TABS Take 1 tablet (3 mg total) by mouth at bedtime. Take immediately before bedtime 30 tablet 1  . ferrous sulfate 325 (65 FE) MG tablet Take 325 mg by mouth daily with breakfast.    . folic acid (FOLVITE) 1 MG tablet Take 2 tablets (2 mg total) by mouth daily. 180 tablet 3  . glucose blood (CHOICE DM FORA G20 TEST STRIPS) test strip Use as instructed 100 each 12  . Ibuprofen-Diphenhydramine Cit (ADVIL PM PO) Take 2 tablets by mouth at bedtime.    . Lacosamide (VIMPAT) 150 MG TABS Take 1 tablet (150 mg total) by mouth 2 (two) times daily. 180 tablet 1  . Lactase (DAIRY-RELIEF PO) Take 2 tablets by mouth 2  (two) times daily.     Marland Kitchen loratadine (CLARITIN) 10 MG tablet Take 10 mg by mouth daily.    . mercaptopurine (PURINETHOL) 50 MG tablet TAKE 2 TABLETS DAILY ON AN EMPTY STOMACH 1 HOUR BEFOREOR 2 HOURS AFTER MEALS     (CAUTION: CHEMOTHERAPY) 180 tablet 1  . mesalamine (LIALDA) 1.2 g EC tablet Take 2 tablets (2.4 g total) by mouth daily. 180 tablet 1  . methscopolamine (PAMINE FORTE) 5 MG tablet Take 1 tablet (5 mg total) by mouth 2 (two) times daily. 180 tablet 1  . ONE TOUCH ULTRA TEST test strip USE AS INSTRUCTED 100 each 2  . potassium chloride SA (K-DUR,KLOR-CON) 20 MEQ tablet Take 1 tablet (20 mEq total) by mouth 2 (two) times daily. 180 tablet 3  . Probiotic Product (PROBIOTIC PO) Take 1 tablet by mouth daily.    . sertraline (ZOLOFT) 100 MG tablet Take 1.5 tablets (150 mg total) by mouth daily. 135 tablet 1   Facility-Administered Medications Prior to Visit  Medication Dose Route Frequency Provider Last Rate Last Dose  . gadopentetate dimeglumine (MAGNEVIST) injection 20 mL  20 mL Intravenous Once PRN Krista Blue,  Aliene Beams, MD        PAST MEDICAL HISTORY: Past Medical History:  Diagnosis Date  . Adenomatous colon polyp 10/1999  . Anxiety   . Arthritis    neck, yoga helps.  . Cancer ([place])    skin  . Cataract   . Complex partial seizure ([place])   . Crohn's disease of small and large intestines ([place]) 1999  . Depression   . Diabetes mellitus   . Esophageal stricture   . External hemorrhoids   . GERD (gastroesophageal reflux disease)   . History of kidney stones    3 large stones still present  . History of nuclear stress test 11/15/2010   exercise myoview; normal pattern of perfusion; normal, low risk study   . Hyperlipemia   . Hypertension   . Migraines   . MRSA (methicillin resistant Staphylococcus aureus) 10/2010   . Osteoporosis   . Pneumonia   . PONV (postoperative nausea and vomiting)   . PTSD (post-traumatic stress disorder)   . Seizures ([place])   . Skin cancer    squamous cell  multiple; [facility]; followed every 3 months.  . Sleep apnea    CPAP machine, uses nightly    PAST SURGICAL HISTORY: Past Surgical History:  Procedure Laterality Date  . CATARACT EXTRACTION Bilateral   . ELBOW SURGERY  2012   elbow MRSA infection   . EYE SURGERY     cataracts removed, /w IOL  . INSERTION OF MESH N/A 07/01/2016   Procedure: INSERTION OF MESH;  Surgeon: [name], MD;  Location: [facility];  Service: General;  Laterality: N/A;  . SHOULDER SURGERY    . SINUS SURGERY WITH INSTATRAK    . TRANSTHORACIC ECHOCARDIOGRAM  02/22/2011   EF 55-65%; increased pattern of LVH with mild conc hypertrophy, abnormal relaxation & increased filling pressure (grade 2 diastolic dysfunction); atrial septum thickened (lipomatous hypertrophy)  . UMBILICAL HERNIA REPAIR N/A 07/01/2016   Procedure: UMBILICAL HERNIA REPAIR WITH MESH;  Surgeon: [name], MD;  Location: [facility];  Service: General;  Laterality: N/A;  . VASECTOMY      FAMILY HISTORY: Family History  Problem Relation Age of Onset  . Colon polyps Father   . Stroke Father   . Hyperlipidemia Father   . Hypertension Father   . Heart disease Mother        CABG at age 59  . Hyperlipidemia Mother   . Hypertension Mother   . Stroke Paternal Grandmother   . Stroke Paternal Grandfather   . Other Child        tetrology of fallot (cornealia deland syndrome)  . Colon cancer Neg Hx     SOCIAL HISTORY: Social History   Social History  . Marital status: Married    Spouse name: [name]  . Number of children: 2  . Years of education: college   Occupational History  . disabled     seizure disorder with memory impairment   Social History Main Topics  . Smoking status: Never Smoker  . Smokeless tobacco: Never Used  . Alcohol use No  . Drug use: No  . Sexual activity: Yes    Birth control/ protection: Surgical     Comment: 1 partner in last 12 months   Other Topics Concern  . Not on file   Social History Narrative    Marital status: married x 32 years;       Lives:   lives at home with his family.      Children:  two children; no grandchildren.  Son is special needs at age 33yo; Cornelian Delaine with TOF.       Employment: unemployed; [employer] long term disability      Tobacco: none      Alcohol: none      Drugs: none      Exercise: unable to exercise due to frequent falls   Patient has a college education.   Patient is right-handed.   Patient drinks one cup of soda daily.     PHYSICAL EXAM  Vitals:   04/02/17 0836  BP: 118/77  Pulse: 92  Weight: 209 lb (94.8 kg)  Height: 5' 11"  (1.803 m)   Body mass index is 29.15 kg/m. Generalized: Well developed, obese male in no acute distress  Head: normocephalic and atraumatic,. Oropharynx benign  Neck: Supple, no carotid bruits  Cardiac: Regular rate rhythm, no murmur  Musculoskeletal: No deformity   Neurological examination   MMSE - Mini Mental State Exam 04/02/2017 04/30/2016 10/23/2015  Orientation to time 2 3 4   Orientation to Place 5 3 5   Registration 3 3 3   Attention/ Calculation 5 3 5   Recall 1 2 2   Language- name 2 objects 2 2 2   Language- repeat 1 1 1   Language- follow 3 step command 3 3 3   Language- read & follow direction 1 1 1   Write a sentence 1 1 1   Copy design 1 1 1   Total score 25 23 28   animal naming 8.  Cranial nerve II-XII: Fundoscopic exam reveals sharp disc margins.Pupils were equal round reactive to light extraocular movements were full, visual field were full on confrontational test. Facial sensation and strength were normal. hearing was intact to finger rubbing bilaterally. Uvula tongue midline. head turning and shoulder shrug were normal and symmetric.Tongue protrusion into cheek strength was normal. Motor: normal bulk and tone, full strength in the BUE, BLE, fine finger movements normal, no pronator drift. No focal weakness Sensory: Mildly length dependent decreased light touch pinprick and vibratory at  toes  Coordination: finger-nose-finger, heel-to-shin bilaterally, no dysmetria Reflexes: Brachioradialis 2/2, biceps 2/2, triceps 2/2, patellar 2/2, Achilles 2/2, plantar responses were flexor bilaterally. Gait and Station: Rising up from seated position without assistance, normal stance, moderate stride, good arm swing, smooth turning, able to perform tiptoe, and heel walking without difficulty. Tandem gait is unsteady. No assistive device  DIAGNOSTIC DATA (LABS, IMAGING, TESTING) - I reviewed patient records, labs, notes, testing and imaging myself where available.  Lab Results  Component Value Date   WBC 3.4 02/11/2017   HGB 12.9 (L) 02/11/2017   HCT 37.0 (L) 02/11/2017   MCV 106 (H) 02/11/2017   PLT 114 (L) 02/11/2017      Component Value Date/Time   NA 143 02/11/2017 1229   K 3.9 02/11/2017 1229   CL 108 (H) 02/11/2017 1229   CO2 20 02/11/2017 1229   GLUCOSE 107 (H) 02/11/2017 1229   GLUCOSE 138 (H) 06/27/2016 0928   BUN 12 02/11/2017 1229   CREATININE 0.87 02/11/2017 1229   CREATININE 0.84 05/15/2016 1005   CALCIUM 9.1 02/11/2017 1229   PROT 7.0 02/18/2017 1044   PROT 6.9 02/11/2017 1229   ALBUMIN 4.1 02/18/2017 1044   ALBUMIN 4.2 02/11/2017 1229   AST 31 02/18/2017 1044   ALT 21 02/18/2017 1044   ALKPHOS 154 (H) 02/18/2017 1044   BILITOT 3.2 (H) 02/18/2017 1044   BILITOT 3.4 (H) 02/11/2017 1229   GFRNONAA 97 02/11/2017 1229   GFRNONAA >89 06/03/2015  0172   GFRAA 112 02/11/2017 1229   GFRAA >89 06/03/2015 0858    Lab Results  Component Value Date   HGBA1C 4.7 (L) 02/11/2017   Lab Results  Component Value Date   VITAMINB12 1,983 (H) 02/11/2017    ASSESSMENT AND PLAN 56 y.o. year old male Recurrent episode of sudden onset confusion  Previous video EEG monitoring failed to show epileptiform discharge doing spells, suggestive of pseudoseizure  Neuropsychiatric evaluation in September 2018 also suggest a diagnosis of conversion disorder  We will taper off  Vimpat, started lamotrigine, titrating to 100 mg twice a day,  Continue to work with his psychiatrist  Marcial Pacas, M.D. Ph.D.  [facility] [place], [place] [address] Phone: [phone] Fax:      [phone]

## 2017-04-02 NOTE — Patient Instructions (Signed)
Tapering off vimpat,   Titrating up on Lamotrigine to 191m twice a day, staring with Lamotrigine 277mtablets.

## 2017-04-04 NOTE — Telephone Encounter (Signed)
Disability form completion; will place on disability desk for processing.

## 2017-04-22 ENCOUNTER — Encounter: Payer: Self-pay | Admitting: Gastroenterology

## 2017-04-22 ENCOUNTER — Encounter: Payer: 59 | Admitting: Gastroenterology

## 2017-04-22 DIAGNOSIS — L910 Hypertrophic scar: Secondary | ICD-10-CM | POA: Diagnosis not present

## 2017-04-22 DIAGNOSIS — L570 Actinic keratosis: Secondary | ICD-10-CM | POA: Diagnosis not present

## 2017-04-22 DIAGNOSIS — D0422 Carcinoma in situ of skin of left ear and external auricular canal: Secondary | ICD-10-CM | POA: Diagnosis not present

## 2017-04-22 DIAGNOSIS — D485 Neoplasm of uncertain behavior of skin: Secondary | ICD-10-CM | POA: Diagnosis not present

## 2017-04-22 DIAGNOSIS — L859 Epidermal thickening, unspecified: Secondary | ICD-10-CM | POA: Diagnosis not present

## 2017-04-25 ENCOUNTER — Telehealth: Payer: Self-pay

## 2017-04-25 MED ORDER — LAMOTRIGINE 100 MG PO TABS: 100.0000 mg | ORAL_TABLET | Freq: Two times a day (BID) | ORAL | 3 refills | Status: DC

## 2017-04-25 NOTE — Telephone Encounter (Signed)
Refill sent to patient's mail order pharmacy

## 2017-04-27 DIAGNOSIS — S20211A Contusion of right front wall of thorax, initial encounter: Secondary | ICD-10-CM | POA: Diagnosis not present

## 2017-05-05 ENCOUNTER — Emergency Department (HOSPITAL_BASED_OUTPATIENT_CLINIC_OR_DEPARTMENT_OTHER): Payer: 59

## 2017-05-05 ENCOUNTER — Telehealth: Payer: Self-pay | Admitting: Family Medicine

## 2017-05-05 ENCOUNTER — Telehealth: Payer: Self-pay | Admitting: Gastroenterology

## 2017-05-05 ENCOUNTER — Encounter (HOSPITAL_BASED_OUTPATIENT_CLINIC_OR_DEPARTMENT_OTHER): Payer: Self-pay | Admitting: Emergency Medicine

## 2017-05-05 ENCOUNTER — Emergency Department (HOSPITAL_BASED_OUTPATIENT_CLINIC_OR_DEPARTMENT_OTHER)
Admission: EM | Admit: 2017-05-05 | Discharge: 2017-05-05 | Disposition: A | Discharge status: Home / Self Care | Payer: 59 | Source: Home / Self Care | Attending: Emergency Medicine | Admitting: Emergency Medicine

## 2017-05-05 ENCOUNTER — Other Ambulatory Visit: Payer: Self-pay

## 2017-05-05 DIAGNOSIS — E119 Type 2 diabetes mellitus without complications: Secondary | ICD-10-CM | POA: Insufficient documentation

## 2017-05-05 DIAGNOSIS — K5900 Constipation, unspecified: Secondary | ICD-10-CM

## 2017-05-05 DIAGNOSIS — Z85828 Personal history of other malignant neoplasm of skin: Secondary | ICD-10-CM

## 2017-05-05 DIAGNOSIS — N3000 Acute cystitis without hematuria: Secondary | ICD-10-CM | POA: Insufficient documentation

## 2017-05-05 DIAGNOSIS — I10 Essential (primary) hypertension: Secondary | ICD-10-CM

## 2017-05-05 DIAGNOSIS — Z79899 Other long term (current) drug therapy: Secondary | ICD-10-CM | POA: Insufficient documentation

## 2017-05-05 DIAGNOSIS — Z7982 Long term (current) use of aspirin: Secondary | ICD-10-CM

## 2017-05-05 DIAGNOSIS — B349 Viral infection, unspecified: Secondary | ICD-10-CM

## 2017-05-05 DIAGNOSIS — R1084 Generalized abdominal pain: Secondary | ICD-10-CM | POA: Insufficient documentation

## 2017-05-05 DIAGNOSIS — R7881 Bacteremia: Secondary | ICD-10-CM | POA: Diagnosis not present

## 2017-05-05 DIAGNOSIS — A4101 Sepsis due to Methicillin susceptible Staphylococcus aureus: Secondary | ICD-10-CM | POA: Diagnosis not present

## 2017-05-05 DIAGNOSIS — K5730 Diverticulosis of large intestine without perforation or abscess without bleeding: Secondary | ICD-10-CM | POA: Diagnosis not present

## 2017-05-05 LAB — CBC WITH DIFFERENTIAL/PLATELET
BASOS PCT: 0 %
Basophils Absolute: 0 10*3/uL (ref 0.0–0.1)
EOS PCT: 1 %
Eosinophils Absolute: 0.1 10*3/uL (ref 0.0–0.7)
HEMATOCRIT: 32.7 % — AB (ref 39.0–52.0)
Hemoglobin: 11.5 g/dL — ABNORMAL LOW (ref 13.0–17.0)
LYMPHS ABS: 0.4 10*3/uL — AB (ref 0.7–4.0)
Lymphocytes Relative: 5 %
MCH: 38.1 pg — ABNORMAL HIGH (ref 26.0–34.0)
MCHC: 35.2 g/dL (ref 30.0–36.0)
MCV: 108.3 fL — AB (ref 78.0–100.0)
MONOS PCT: 12 %
Monocytes Absolute: 1 10*3/uL (ref 0.1–1.0)
NEUTROS PCT: 82 %
Neutro Abs: 7.2 10*3/uL (ref 1.7–7.7)
Platelets: 105 10*3/uL — ABNORMAL LOW (ref 150–400)
RBC: 3.02 MIL/uL — AB (ref 4.22–5.81)
RDW: 15 % (ref 11.5–15.5)
SMEAR REVIEW: DECREASED
WBC: 8.7 10*3/uL (ref 4.0–10.5)

## 2017-05-05 LAB — COMPREHENSIVE METABOLIC PANEL
ALT: 25 U/L (ref 17–63)
AST: 27 U/L (ref 15–41)
Albumin: 3.8 g/dL (ref 3.5–5.0)
Alkaline Phosphatase: 134 U/L — ABNORMAL HIGH (ref 38–126)
Anion gap: 6 (ref 5–15)
BILIRUBIN TOTAL: 5 mg/dL — AB (ref 0.3–1.2)
BUN: 16 mg/dL (ref 6–20)
CALCIUM: 8.7 mg/dL — AB (ref 8.9–10.3)
CO2: 22 mmol/L (ref 22–32)
CREATININE: 0.69 mg/dL (ref 0.61–1.24)
Chloride: 108 mmol/L (ref 101–111)
GFR calc Af Amer: >60 mL/min (ref >60)
Glucose, Bld: 107 mg/dL — ABNORMAL HIGH (ref 65–99)
Potassium: 3.7 mmol/L (ref 3.5–5.1)
Sodium: 136 mmol/L (ref 135–145)
TOTAL PROTEIN: 6.7 g/dL (ref 6.5–8.1)

## 2017-05-05 LAB — URINALYSIS, ROUTINE W REFLEX MICROSCOPIC
BILIRUBIN URINE: NEGATIVE
Glucose, UA: NEGATIVE mg/dL
KETONES UR: NEGATIVE mg/dL
Nitrite: NEGATIVE
PH: 6.5 (ref 5.0–8.0)
PROTEIN: 30 mg/dL — AB
Specific Gravity, Urine: 1.02 (ref 1.005–1.030)

## 2017-05-05 LAB — URINALYSIS, MICROSCOPIC (REFLEX)

## 2017-05-05 LAB — PROTIME-INR
INR: 1.32
Prothrombin Time: 16.3 seconds — ABNORMAL HIGH (ref 11.4–15.2)

## 2017-05-05 LAB — LIPASE, BLOOD: Lipase: 17 U/L (ref 11–51)

## 2017-05-05 LAB — I-STAT CG4 LACTIC ACID, ED: LACTIC ACID, VENOUS: 1.12 mmol/L (ref 0.5–1.9)

## 2017-05-05 MED ORDER — KETOROLAC TROMETHAMINE 30 MG/ML IJ SOLN
30.0000 mg | Freq: Once | INTRAMUSCULAR | Status: AC
  Administered 2017-05-05: 30 mg via INTRAVENOUS
  Filled 2017-05-05: qty 1

## 2017-05-05 MED ORDER — PROCHLORPERAZINE EDISYLATE 5 MG/ML IJ SOLN
10.0000 mg | Freq: Once | INTRAMUSCULAR | Status: AC
  Administered 2017-05-05: 10 mg via INTRAVENOUS
  Filled 2017-05-05: qty 2

## 2017-05-05 MED ORDER — ONDANSETRON HCL 4 MG/2ML IJ SOLN
4.0000 mg | Freq: Once | INTRAMUSCULAR | Status: AC
  Administered 2017-05-05: 4 mg via INTRAVENOUS
  Filled 2017-05-05: qty 2

## 2017-05-05 MED ORDER — DIPHENHYDRAMINE HCL 25 MG PO CAPS
25.0000 mg | ORAL_CAPSULE | Freq: Once | ORAL | Status: AC
  Administered 2017-05-05: 25 mg via ORAL
  Filled 2017-05-05: qty 1

## 2017-05-05 MED ORDER — POLYETHYLENE GLYCOL 3350 17 G PO PACK: 17.0000 g | PACK | Freq: Every day | ORAL | 0 refills | Status: DC

## 2017-05-05 MED ORDER — LEVOFLOXACIN 750 MG PO TABS: 750.0000 mg | ORAL_TABLET | Freq: Every day | ORAL | 0 refills | Status: DC

## 2017-05-05 MED ORDER — FENTANYL CITRATE (PF) 100 MCG/2ML IJ SOLN
50.0000 ug | Freq: Once | INTRAMUSCULAR | Status: AC
  Administered 2017-05-05: 50 ug via INTRAVENOUS
  Filled 2017-05-05: qty 2

## 2017-05-05 MED ORDER — FLEET ENEMA 7-19 GM/118ML RE ENEM
1.0000 | ENEMA | Freq: Once | RECTAL | Status: AC
  Administered 2017-05-05: 1 via RECTAL
  Filled 2017-05-05: qty 1

## 2017-05-05 MED ORDER — IOPAMIDOL (ISOVUE-300) INJECTION 61%
100.0000 mL | Freq: Once | INTRAVENOUS | Status: AC | PRN
  Administered 2017-05-05: 100 mL via INTRAVENOUS

## 2017-05-05 MED ORDER — SODIUM CHLORIDE 0.9 % IV BOLUS (SEPSIS)
1000.0000 mL | Freq: Once | INTRAVENOUS | Status: AC
  Administered 2017-05-05: 1000 mL via INTRAVENOUS

## 2017-05-05 MED ORDER — DIPHENHYDRAMINE HCL 50 MG/ML IJ SOLN: 25.0000 mg | Freq: Once | INTRAMUSCULAR | Status: DC

## 2017-05-05 MED FILL — levoFLOXacin 750 MG TABS: 750 | 5 days supply | Qty: 5 | Fill #0

## 2017-05-05 MED FILL — POLYETHYLENE GLYCOL 3350: 15 days supply | Qty: 255 | Fill #0

## 2017-05-05 NOTE — ED Triage Notes (Addendum)
Patient reports fever since Friday.  Reports that he also hasn't had a bowel movement since 2 weeks.  Reports hx of diverticulitis and states he tried an enema without relief. Also reports right rib fractures from fall 2 weeks ago.  States he still continues to have problems with this.

## 2017-05-05 NOTE — Telephone Encounter (Signed)
No charge this time. 

## 2017-05-05 NOTE — Discharge Instructions (Signed)
Your workup today showed no evidence of obstruction or bowel inflammation.  We found constipation present.  After the enemas, your bowels moved.  Please use the MiraLAX to continue having bowel movements for the next few days.  Please use the antibiotic to treat the urinary tract infection we discovered.  Please stay hydrated and follow-up with your primary care physician for reassessment.  If any symptoms change or worsen, please return to the nearest emergency department.

## 2017-05-05 NOTE — ED Notes (Signed)
ED Provider at bedside. 

## 2017-05-05 NOTE — Telephone Encounter (Signed)
-----   Message from [name], CRNA sent at 05/04/2017  8:11 PM EST ----- Regarding: RE: Seizures [name] [name],  This pt is cleared for anesthetic care at [facility].  Thanks,  [name] [name]  ----- Message ----- From: Ronelle Nigh, CMA Sent: 05/02/2017   4:58 PM To: [name], CRNA Subject: Seizures                                       [name] [name],  Pt. Saw Dr. Fuller Plan 02/18/2017 and he has an extensive history of Seizures.  [name] [name] called patient and spoke to his wife. She says he has episodes of blacking out, lip smacking, memory loss and confusion.  She denies that he has had any lip smacking recently.  Please review patient's chart and see if he is okay to have Colonoscopy here at [facility].  Thanks, B.Driana Dazey, CMA

## 2017-05-05 NOTE — Telephone Encounter (Signed)
Patient should have another 3 months prescription left.     I can call in 1 week supply to hold over.

## 2017-05-05 NOTE — ED Provider Notes (Signed)
[facility] EMERGENCY DEPARTMENT Provider Note   CSN: [id number] Arrival date & time: 05/05/17  8185     History   Chief Complaint Chief Complaint  Patient presents with  . Fever  . Constipation    HPI [name] [name] is a 56 y.o. male.  The history is provided by the patient and medical records. No language interpreter was used.  Abdominal Pain   This is a new problem. The current episode started more than 2 days ago. The problem occurs constantly. The problem has been gradually worsening. The pain is associated with an unknown factor. The pain is located in the generalized abdominal region. The quality of the pain is aching and cramping. The pain is severe. Associated symptoms include nausea and constipation. Pertinent negatives include fever, diarrhea, flatus, vomiting, dysuria, frequency and headaches. The symptoms are aggravated by palpation and eating. Nothing relieves the symptoms. His past medical history is significant for Crohn's disease.    Past Medical History:  Diagnosis Date  . Adenomatous colon polyp 10/1999  . Anxiety   . Arthritis    neck, yoga helps.  . Cancer ([place])    skin  . Cataract   . Complex partial seizure ([place])   . Crohn's disease of small and large intestines ([place]) 1999  . Depression   . Diabetes mellitus   . Esophageal stricture   . External hemorrhoids   . GERD (gastroesophageal reflux disease)   . History of kidney stones    3 large stones still present  . History of nuclear stress test 11/15/2010   exercise myoview; normal pattern of perfusion; normal, low risk study   . Hyperlipemia   . Hypertension   . Migraines   . MRSA (methicillin resistant Staphylococcus aureus) 10/2010   . Osteoporosis   . Pneumonia   . PONV (postoperative nausea and vomiting)   . PTSD (post-traumatic stress disorder)   . Seizures ([place])   . Skin cancer    squamous cell multiple; [place]; followed every 3 months.  . Sleep apnea    CPAP machine, uses  nightly    Patient Active Problem List   Diagnosis Date Noted  . Confusion 04/02/2017  . Syncope 08/30/2016  . Depression 04/30/2016  . Memory loss 04/30/2016  . OSA on CPAP 10/31/2015  . Nonepileptic episode ([place]) 04/25/2015  . Seizure ([place]) 04/04/2013  . Abnormal EKG 03/12/2013  . DOE (dyspnea on exertion) 03/12/2013  . Complex partial seizure ([place]) 04/19/2012  . DM2 (diabetes mellitus, type 2) ([place]) 09/25/2011  . Altered mental status 09/25/2011  . OTHER DYSPHAGIA 03/27/2009  . TRANSAMINASES, SERUM, ELEVATED 03/27/2009  . Cough 11/10/2008  . GERD 12/07/2007  . COLONIC POLYPS, ADENOMATOUS, HX OF 12/07/2007  . EXTERNAL HEMORRHOIDS 09/11/2007  . CROHN'S DISEASE, LARGE AND SMALL INTESTINES 09/11/2007  . OSTEOPOROSIS 09/11/2007  . HYPERLIPIDEMIA NEC/NOS 02/27/2007  . Essential hypertension 02/27/2007    Past Surgical History:  Procedure Laterality Date  . CATARACT EXTRACTION Bilateral   . ELBOW SURGERY  2012   elbow MRSA infection   . EYE SURGERY     cataracts removed, /w IOL  . SHOULDER SURGERY    . SINUS SURGERY WITH INSTATRAK    . TRANSTHORACIC ECHOCARDIOGRAM  02/22/2011   EF 55-65%; increased pattern of LVH with mild conc hypertrophy, abnormal relaxation & increased filling pressure (grade 2 diastolic dysfunction); atrial septum thickened (lipomatous hypertrophy)  . VASECTOMY         Home Medications    Prior to Admission medications  Medication Sig Start Date End Date Taking? Authorizing Provider  aspirin EC 81 MG tablet Take 81 mg by mouth daily.    [provider]  azelastine (ASTELIN) 0.1 % nasal spray Place 2 sprays into both nostrils 2 (two) times daily. Use in each nostril as directed 05/30/16   Wardell Honour, MD  calcium carbonate (CALCI-CHEW) 1250 (500 Ca) MG chewable tablet Chew 2 tablets by mouth daily.    [provider]  cholecalciferol (VITAMIN D) 1000 units tablet Take 2,000 Units by mouth daily.    [provider]    diltiazem (TIAZAC) 180 MG 24 hr capsule Take 1 capsule (180 mg total) by mouth daily. 10/21/16   Wardell Honour, MD  eletriptan (RELPAX) 40 MG tablet Take 1 tablet at onset of headache, may repeat in 2 hours if headache persists or reoccurs. Patient taking differently: Take 40 mg by mouth every 2 (two) hours as needed. Take 1 tablet at onset of headache, may repeat in 2 hours if headache persists or reoccurs. 01/18/13   Wendie Agreste, MD  esomeprazole (NEXIUM) 40 MG capsule TAKE 1 CAPSULE TWICE A DAY 12/18/16   Ladene Artist, MD  Eszopiclone 3 MG TABS Take 1 tablet (3 mg total) by mouth at bedtime. Take immediately before bedtime 03/21/17   Wardell Honour, MD  ferrous sulfate 325 (65 FE) MG tablet Take 325 mg by mouth daily with breakfast.    [provider]  folic acid (FOLVITE) 1 MG tablet Take 2 tablets (2 mg total) by mouth daily. 11/19/16   Wardell Honour, MD  glucose blood (CHOICE DM FORA G20 TEST STRIPS) test strip Use as instructed 11/13/12   [name], MD  Ibuprofen-Diphenhydramine Cit (ADVIL PM PO) Take 2 tablets by mouth at bedtime.    [provider]  Lacosamide (VIMPAT) 150 MG TABS Take 1 tablet (150 mg total) by mouth 2 (two) times daily. 10/22/16   Dennie Bible, NP  Lactase (DAIRY-RELIEF PO) Take 2 tablets by mouth 2 (two) times daily.     [provider]  lamoTRIgine (LAMICTAL) 100 MG tablet Take 1 tablet (100 mg total) by mouth 2 (two) times daily. 04/25/17   Marcial Pacas, MD  lamoTRIgine (LAMICTAL) 25 MG tablet 1 tablet twice a day for the first week 2 tablets twice a day for the second week 3 tablets twice a day for the third week 4 tablets twice a day for the fourth week  For total of 140 tablets  After finish titration with small dose of lamotrigine 25 mg, change to lamotrigine 100 mg twice a day 04/02/17   Marcial Pacas, MD  levofloxacin (LEVAQUIN) 750 MG tablet TK 1 T PO D 03/31/17   [provider]  loratadine (CLARITIN) 10  MG tablet Take 10 mg by mouth daily.    [provider]  mercaptopurine (PURINETHOL) 50 MG tablet TAKE 2 TABLETS DAILY ON AN EMPTY STOMACH 1 HOUR BEFOREOR 2 HOURS AFTER MEALS     (CAUTION: CHEMOTHERAPY) 02/18/17   Ladene Artist, MD  mesalamine (LIALDA) 1.2 g EC tablet Take 2 tablets (2.4 g total) by mouth daily. 02/18/17   Ladene Artist, MD  methscopolamine (PAMINE FORTE) 5 MG tablet Take 1 tablet (5 mg total) by mouth 2 (two) times daily. 02/18/17   Ladene Artist, MD  ONE TOUCH ULTRA TEST test strip USE AS INSTRUCTED    Weber, Damaris Hippo, PA-C  potassium chloride SA (K-DUR,KLOR-CON) 20 MEQ tablet  Take 1 tablet (20 mEq total) by mouth 2 (two) times daily. 08/20/16   Wardell Honour, MD  Probiotic Product (PROBIOTIC PO) Take 1 tablet by mouth daily.    [provider]  sertraline (ZOLOFT) 100 MG tablet Take 200 mg by mouth daily. 03/16/17   [provider]  traZODone (DESYREL) 100 MG tablet Take 100 mg by mouth at bedtime. 04/01/17   [provider]    Family History Family History  Problem Relation Age of Onset  . Colon polyps Father   . Stroke Father   . Hyperlipidemia Father   . Hypertension Father   . Heart disease Mother        CABG at age 89  . Hyperlipidemia Mother   . Hypertension Mother   . Stroke Paternal Grandmother   . Stroke Paternal Grandfather   . Other Child        tetrology of fallot (cornealia deland syndrome)  . Colon cancer Neg Hx     Social History Social History   Tobacco Use  . Smoking status: Never Smoker  . Smokeless tobacco: Never Used  Substance Use Topics  . Alcohol use: No  . Drug use: No     Allergies   Penicillins; Sulfonamide derivatives; Azithromycin; Beef-derived products; Milk-related compounds; and Dilaudid [hydromorphone hcl]   Review of Systems Review of Systems  Constitutional: Negative for chills, diaphoresis, fatigue and fever.  HENT: Negative for congestion.   Eyes: Negative for visual  disturbance.  Respiratory: Negative for cough, chest tightness, shortness of breath, wheezing and stridor.   Cardiovascular: Negative for chest pain.  Gastrointestinal: Positive for abdominal pain, constipation and nausea. Negative for abdominal distention, diarrhea, flatus and vomiting.  Genitourinary: Negative for dysuria, flank pain, frequency and testicular pain.  Musculoskeletal: Negative for back pain, neck pain and neck stiffness.  Skin: Negative for rash and wound.  Neurological: Negative for light-headedness, numbness and headaches.  Psychiatric/Behavioral: Negative for agitation and confusion.  All other systems reviewed and are negative.    Physical Exam Updated Vital Signs Pulse 96   Temp 98.8 F (37.1 C) (Oral)   Resp 16   Ht 5' 11"  (1.803 m)   Wt 90.7 kg (200 lb)   SpO2 98%   BMI 27.89 kg/m   Physical Exam  Constitutional: He is oriented to person, place, and time. He appears well-developed and well-nourished. No distress.  HENT:  Head: Normocephalic and atraumatic.  Nose: Nose normal.  Mouth/Throat: Oropharynx is clear and moist.  Eyes: Conjunctivae and EOM are normal. Pupils are equal, round, and reactive to light.  Neck: Normal range of motion. Neck supple.  Cardiovascular: Normal rate and regular rhythm.  No murmur heard. Pulmonary/Chest: Effort normal and breath sounds normal. No respiratory distress. He has no wheezes. He has no rales. He exhibits no tenderness.  Abdominal: Soft. Normal appearance. He exhibits no distension and no mass. There is generalized tenderness. There is no rigidity, no rebound and no CVA tenderness.    Musculoskeletal: He exhibits no edema or tenderness.  Neurological: He is alert and oriented to person, place, and time. No sensory deficit. He exhibits normal muscle tone.  Skin: Skin is warm and dry. Capillary refill takes less than 2 seconds. He is not diaphoretic. No erythema. No pallor.  Psychiatric: He has a normal mood and  affect.  Nursing note and vitals reviewed.    ED Treatments / Results  Labs (all labs ordered are listed, but only abnormal results are displayed) Labs Reviewed  CBC WITH DIFFERENTIAL/PLATELET - Abnormal; Notable for the following components:      Result Value   RBC 3.02 (*)    Hemoglobin 11.5 (*)    HCT 32.7 (*)    MCV 108.3 (*)    MCH 38.1 (*)    Platelets 105 (*)    Lymphs Abs 0.4 (*)    All other components within normal limits  COMPREHENSIVE METABOLIC PANEL - Abnormal; Notable for the following components:   Glucose, Bld 107 (*)    Calcium 8.7 (*)    Alkaline Phosphatase 134 (*)    Total Bilirubin 5.0 (*)    All other components within normal limits  PROTIME-INR - Abnormal; Notable for the following components:   Prothrombin Time 16.3 (*)    All other components within normal limits  URINALYSIS, ROUTINE W REFLEX MICROSCOPIC - Abnormal; Notable for the following components:   Color, Urine ORANGE (*)    APPearance CLOUDY (*)    Hgb urine dipstick SMALL (*)    Protein, ur 30 (*)    Leukocytes, UA LARGE (*)    All other components within normal limits  URINALYSIS, MICROSCOPIC (REFLEX) - Abnormal; Notable for the following components:   Bacteria, UA MANY (*)    Squamous Epithelial / LPF 0-5 (*)    All other components within normal limits  URINE CULTURE  CULTURE, BLOOD (ROUTINE X 2)  CULTURE, BLOOD (ROUTINE X 2)  LIPASE, BLOOD  I-STAT CG4 LACTIC ACID, ED    EKG  EKG Interpretation None       Radiology Dg Chest 2 View  Result Date: 05/05/2017 CLINICAL DATA:  A fever for the past 3 days. History of Crohn's disease, hypertension, diabetes, nonsmoker. EXAM: CHEST  2 VIEW COMPARISON:  Chest x-ray of May 14, 2016 FINDINGS: The lungs are adequately inflated. The interstitial markings are coarse though stable. The cardiac silhouette is top-normal in size. The pulmonary vascularity is normal. The mediastinum is normal in width. There is no pleural effusion. The  bony thorax exhibits no acute abnormality. IMPRESSION: Chronic interstitial prominence bilaterally. No alveolar pneumonia, CHF, nor other acute cardiopulmonary abnormality. Electronically Signed   By: David  Martinique M.D.   On: 05/05/2017 08:41   Ct Abdomen Pelvis W Contrast  Result Date: 05/05/2017 CLINICAL DATA:  History of Crohn's disease.  Constipation and pain EXAM: CT ABDOMEN AND PELVIS WITH CONTRAST TECHNIQUE: Multidetector CT imaging of the abdomen and pelvis was performed using the standard protocol following bolus administration of intravenous contrast. Oral contrast was also administered. CONTRAST:  135m ISOVUE-300 IOPAMIDOL (ISOVUE-300) INJECTION 61% COMPARISON:  July 22, 2016 FINDINGS: Lower chest: There is bibasilar lung atelectasis, primarily on the right posteriorly. Hepatobiliary: No focal liver lesions are evident. Gallbladder wall is not appreciably thickened. There is no biliary duct dilatation. Pancreas: No pancreatic mass or inflammatory focus. Spleen: Spleen is enlarged, measuring 18.1 x 17.1 x 7.4 cm with a measured splenic volume of 1,145 cubic cm. No focal splenic lesions are evident. Adrenals/Urinary Tract: Adrenals appear normal bilaterally. There is a cyst arising from the posterior upper pole left kidney measuring 2.5 x 2.9 cm. There is a second cyst laterally on the left measuring 2.8 x 2.8 cm. On the right inferiorly and medially, there is an area of decreased attenuation measuring 2.3 x 2.3 cm which raises concern for a noncystic mass. There is no hydronephrosis on either side. There is a calculus in the left mid kidney measuring 1.3 x 0.7 cm. A second calculus on the left  measures 1.1 x 0.7 cm. There is a 3 mm calculus in this area as well. There are no ureteral calculi on either side. Urinary bladder is midline with wall thickness within normal limits. Stomach/Bowel: There are scattered colonic diverticula without diverticulitis. There is no appreciable bowel wall or  mesenteric thickening. There is moderate fluid throughout the colon without dilatation. No mesenteric thickening evident. No fistula noted. No bowel obstruction. No free air or portal venous air evident. Vascular/Lymphatic: There are foci of atherosclerotic calcification in the aorta and common iliac arteries. No aneurysm evident. Major mesenteric vessels appear patent. There is no adenopathy appreciable in the abdomen or pelvis. Reproductive: Prostate and seminal vesicles appear normal in size and contour. No pelvic mass evident. Other: There is no periappendiceal inflammation. There is scarring in the umbilical region anteriorly. There is a fluid collection in this area with attenuation value higher than is expected with serous fluid. This collection is smaller than on prior study, currently measuring 4.9 x 4.7 x 2.0 cm. There is no air in this collection. There is no abscess or ascites evident in the abdomen or pelvis. There is fat in each inguinal ring. Musculoskeletal: No blastic or lytic bone lesions. There is degenerative change in the lower thoracic and lumbar spine. No intramuscular lesions are evident. IMPRESSION: 1. Within the right kidney, there is an area of decreased attenuation measuring 2.3 x 2.3 cm in the lower pole region inferiorly that cannot be classified as a cyst. Further assessment advised nonemergently. Further evaluation with pre and post contrast MRI or CT should be considered. MRI is preferred in younger patients (due to lack of ionizing radiation) and for evaluating calcified lesion(s). 2. Several nonobstructing calculi in left kidney. No hydronephrosis or ureteral calculus on either side. 3.  Splenomegaly of uncertain etiology. 4. Sigmoid diverticula without diverticulitis. No bowel obstruction. No bowel wall thickening. No fistula. 5. Scarring at the level of a previously repaired umbilical hernia. Fluid in this area likely represents partially resolving and liquefying chronic  hematoma. 6.  Mild aortic atherosclerosis. Aortic Atherosclerosis (ICD10-I70.0). Electronically Signed   By: Lowella Grip III M.D.   On: 05/05/2017 10:50    Procedures Procedures (including critical care time)  Medications Ordered in ED Medications  fentaNYL (SUBLIMAZE) injection 50 mcg (50 mcg Intravenous Given 05/05/17 0920)  sodium chloride 0.9 % bolus 1,000 mL (0 mLs Intravenous Stopped 05/05/17 1038)  ondansetron (ZOFRAN) injection 4 mg (4 mg Intravenous Given 05/05/17 0919)  iopamidol (ISOVUE-300) 61 % injection 100 mL (100 mLs Intravenous Contrast Given 05/05/17 1015)  sodium phosphate (FLEET) 7-19 GM/118ML enema 1 enema (1 enema Rectal Given 05/05/17 1537)  prochlorperazine (COMPAZINE) injection 10 mg (10 mg Intravenous Given 05/05/17 1531)  ketorolac (TORADOL) 30 MG/ML injection 30 mg (30 mg Intravenous Given 05/05/17 1534)  diphenhydrAMINE (BENADRYL) capsule 25 mg (25 mg Oral Given 05/05/17 1530)     Initial Impression / Assessment and Plan / ED Course  I have reviewed the triage vital signs and the nursing notes.  Pertinent labs & imaging results that were available during my care of the patient were reviewed by me and considered in my medical decision making (see chart for details).     Alonso Gapinski is a 56 y.o. male with a past medical history significant for hypertension, GERD, Crohn's disease, diverticulitis, diabetes, seizures, anxiety, depression, PTSD, osteoporosis, right-sided rib fractures from recent fall who presents with decreased bowel movement, decreased flatus, severe right-sided abdominal pain, nausea, vomiting, fevers, dysuria, and urinary frequency.  Patient says that for the last 3 days he has had fevers up to 103.  He reports having nausea and vomiting with no blood in the vomit.  He reports his bowel movements have been absent for the last 2 weeks which is abnormal for him.  He reports he normally has 3 or 4 bowel movements a day.  He denies passing  flatus in the last 2 weeks.  He describes the pain as an aching pain that is a 5 out of 10 in severity on the right sided abdomen.  He reports dry cough which he thinks is related to a fall 2 weeks ago hitting his right chest and causing several rib fractures.  He denies any productive cough.  He denies any chest pain.  He denies shortness of breath.  He reports that he has had urinary frequency and dysuria for the last few days.  He reports his urine is discolored.  He does report that he has chronic suicidal ideation for the last 3 years but is being managed by psychiatry.  He denies any worsening.  He denies any suicidal plan.  He wants to focus on the medical aspect of his care.  He is not concerned about his suicidal thoughts today.  On exam, patient has tenderness in the right abdomen.  Bowel sounds were auscultated.  Lungs were clear.  Right side was tender in the lower chest.  No CVA tenderness.  No lower extremity tenderness.  Normal sensation throughout.  Normal strength in extremities.  No stridor.  Based on history of bowel disease and similarity of symptoms, I have concern for diverticulitis or Crohn's flare however obstruction is my primary concern given the lack of bowel movement or flatus.  Patient also may have urinary tract infection or stone given his symptoms.  Patient will have pain medicine, nausea medicine, and fluids provided.  He will have lab testing and imaging to further evaluate.  Also considering pneumonia given the recent fractures and likely decreased depth of inspiration due to pain.  Patient will have x-ray to evaluate.  Anticipate reassessment after workup.  3:11 PM Diagnostic testing results and above.  Patient found to have evidence of urinary tract infection with leukocytes and bacteria with minimal epithelial cells.  CBC shows no leukocytosis but similar anemia to prior.  Low platelets also slightly worsened from prior.  Attic acid not elevated.  Lipase not elevated.   CT scan of the abdomen showed concern for right kidney mass that will need outpatient management and follow-up.  Patient was informed of this and he will follow-up with his PCP.  Kidney stones found but no evidence of obstruction.  Diverticula are discovered but no diverticulitis.  Specifically, no bowel obstruction or evidence of bowel wall thickening or fistula.  No evidence of Crohn's flare.  Imaging was reviewed by me with evidence of stool burden in the colon.  Suspect constipation with no obstruction.  Based on symptoms, suspect urinary tract infection.  Patient will be given an enema for further management of his constipation in the ED.  If patient has bowel movement, will likely discharge home with MiraLAX and antibiotics for the UTI.  Anticipate PCP follow-up for further management of the constipation as well as his kidney mass.  I will getting his enema, patient reported migraine headache development.  Patient will be given headache medications during enema.  Patient had numerous bowel movements after enema.  Patient's abdomen felt better.  Patient will take MiraLAX for the next few  days to continue bowel movements.  Patient will be given prescription for antibiotics to treat UTI that was discovered.  Patient will follow up with his PCP.  Patient understood return precautions.  Patient discharged in good condition with no further constipation and resolution of presenting symptoms.  Final Clinical Impressions(s) / ED Diagnoses   Final diagnoses:  Acute cystitis without hematuria  Generalized abdominal pain  Constipation, unspecified constipation type  Viral illness    ED Discharge Orders        Ordered    levofloxacin (LEVAQUIN) 750 MG tablet  Daily     05/05/17 1654    polyethylene glycol (MIRALAX / GLYCOLAX) packet  Daily     05/05/17 1654      Clinical Impression: 1. Acute cystitis without hematuria   2. Generalized abdominal pain   3. Constipation, unspecified  constipation type   4. Viral illness     Disposition: Discharge  Condition: Good  I have discussed the results, Dx and Tx plan with the pt(& family if present). He/she/they expressed understanding and agree(s) with the plan. Discharge instructions discussed at great length. Strict return precautions discussed and pt &/or family have verbalized understanding of the instructions. No further questions at time of discharge.    This SmartLink is deprecated. Use AVSMEDLIST instead to display the medication list for a patient.  Follow Up: Wardell Honour, MD [place] [id number] [phone]     [facility] [address] [id number] Eula Fried [place] [address] [phone]  If symptoms worsen     Siddalee Vanderheiden, Gwenyth Allegra, MD 05/05/17 2041

## 2017-05-05 NOTE — Telephone Encounter (Signed)
Copied from [place]. Topic: Quick Communication - See Telephone Encounter >> May 05, 2017  9:37 AM Cleaster Corin, NT wrote: CRM for notification. See Telephone encounter for:   05/05/17. Pt. Wife called and said he is out of his potassuim Chloride and was seeing if a week supply could be sent to [facility] and the 90 day mail order be sent through [facility]. Please give wife a call when rx. Has been sent of any other questions [phone]

## 2017-05-05 NOTE — Telephone Encounter (Signed)
Pt. Is cleared by Simone Curia to have colonoscopy here at [facility].  See attached note.  B.Deacon Gadbois, CMA

## 2017-05-06 ENCOUNTER — Inpatient Hospital Stay (HOSPITAL_COMMUNITY)
Admission: EM | Admit: 2017-05-06 | Discharge: 2017-05-11 | DRG: 872 | Disposition: A | Discharge status: Home / Self Care | Payer: 59 | Attending: Family Medicine | Admitting: Family Medicine

## 2017-05-06 ENCOUNTER — Inpatient Hospital Stay (HOSPITAL_COMMUNITY): Payer: 59

## 2017-05-06 ENCOUNTER — Other Ambulatory Visit: Payer: Self-pay

## 2017-05-06 ENCOUNTER — Encounter (HOSPITAL_COMMUNITY): Payer: Self-pay | Admitting: Emergency Medicine

## 2017-05-06 ENCOUNTER — Telehealth (HOSPITAL_BASED_OUTPATIENT_CLINIC_OR_DEPARTMENT_OTHER): Payer: Self-pay | Admitting: Emergency Medicine

## 2017-05-06 ENCOUNTER — Telehealth: Payer: Self-pay | Admitting: Infectious Disease

## 2017-05-06 ENCOUNTER — Ambulatory Visit: Payer: Self-pay | Admitting: *Deleted

## 2017-05-06 ENCOUNTER — Encounter: Payer: 59 | Admitting: Gastroenterology

## 2017-05-06 DIAGNOSIS — C4490 Unspecified malignant neoplasm of skin, unspecified: Secondary | ICD-10-CM

## 2017-05-06 DIAGNOSIS — K50819 Crohn's disease of both small and large intestine with unspecified complications: Secondary | ICD-10-CM | POA: Diagnosis not present

## 2017-05-06 DIAGNOSIS — K5080 Crohn's disease of both small and large intestine without complications: Secondary | ICD-10-CM | POA: Diagnosis present

## 2017-05-06 DIAGNOSIS — G4733 Obstructive sleep apnea (adult) (pediatric): Secondary | ICD-10-CM | POA: Diagnosis not present

## 2017-05-06 DIAGNOSIS — Z9841 Cataract extraction status, right eye: Secondary | ICD-10-CM | POA: Diagnosis not present

## 2017-05-06 DIAGNOSIS — A4101 Sepsis due to Methicillin susceptible Staphylococcus aureus: Secondary | ICD-10-CM | POA: Diagnosis not present

## 2017-05-06 DIAGNOSIS — N390 Urinary tract infection, site not specified: Secondary | ICD-10-CM | POA: Diagnosis not present

## 2017-05-06 DIAGNOSIS — Z9989 Dependence on other enabling machines and devices: Secondary | ICD-10-CM | POA: Diagnosis not present

## 2017-05-06 DIAGNOSIS — I340 Nonrheumatic mitral (valve) insufficiency: Secondary | ICD-10-CM | POA: Diagnosis not present

## 2017-05-06 DIAGNOSIS — K219 Gastro-esophageal reflux disease without esophagitis: Secondary | ICD-10-CM | POA: Diagnosis not present

## 2017-05-06 DIAGNOSIS — F4310 Post-traumatic stress disorder, unspecified: Secondary | ICD-10-CM | POA: Diagnosis present

## 2017-05-06 DIAGNOSIS — Z79899 Other long term (current) drug therapy: Secondary | ICD-10-CM | POA: Diagnosis not present

## 2017-05-06 DIAGNOSIS — I10 Essential (primary) hypertension: Secondary | ICD-10-CM | POA: Diagnosis not present

## 2017-05-06 DIAGNOSIS — Z882 Allergy status to sulfonamides status: Secondary | ICD-10-CM | POA: Diagnosis not present

## 2017-05-06 DIAGNOSIS — C4492 Squamous cell carcinoma of skin, unspecified: Secondary | ICD-10-CM | POA: Diagnosis present

## 2017-05-06 DIAGNOSIS — K5900 Constipation, unspecified: Secondary | ICD-10-CM | POA: Diagnosis present

## 2017-05-06 DIAGNOSIS — Z91011 Allergy to milk products: Secondary | ICD-10-CM

## 2017-05-06 DIAGNOSIS — E119 Type 2 diabetes mellitus without complications: Secondary | ICD-10-CM | POA: Diagnosis not present

## 2017-05-06 DIAGNOSIS — L03113 Cellulitis of right upper limb: Secondary | ICD-10-CM

## 2017-05-06 DIAGNOSIS — B9561 Methicillin susceptible Staphylococcus aureus infection as the cause of diseases classified elsewhere: Secondary | ICD-10-CM | POA: Diagnosis present

## 2017-05-06 DIAGNOSIS — M810 Age-related osteoporosis without current pathological fracture: Secondary | ICD-10-CM | POA: Diagnosis present

## 2017-05-06 DIAGNOSIS — Z880 Allergy status to penicillin: Secondary | ICD-10-CM | POA: Diagnosis not present

## 2017-05-06 DIAGNOSIS — Z7982 Long term (current) use of aspirin: Secondary | ICD-10-CM | POA: Diagnosis not present

## 2017-05-06 DIAGNOSIS — Z885 Allergy status to narcotic agent status: Secondary | ICD-10-CM

## 2017-05-06 DIAGNOSIS — Z9842 Cataract extraction status, left eye: Secondary | ICD-10-CM | POA: Diagnosis not present

## 2017-05-06 DIAGNOSIS — Z8614 Personal history of Methicillin resistant Staphylococcus aureus infection: Secondary | ICD-10-CM

## 2017-05-06 DIAGNOSIS — G40919 Epilepsy, unspecified, intractable, without status epilepticus: Secondary | ICD-10-CM | POA: Diagnosis present

## 2017-05-06 DIAGNOSIS — R0781 Pleurodynia: Secondary | ICD-10-CM | POA: Diagnosis not present

## 2017-05-06 DIAGNOSIS — I5030 Unspecified diastolic (congestive) heart failure: Secondary | ICD-10-CM | POA: Diagnosis not present

## 2017-05-06 DIAGNOSIS — R7881 Bacteremia: Secondary | ICD-10-CM

## 2017-05-06 DIAGNOSIS — Z452 Encounter for adjustment and management of vascular access device: Secondary | ICD-10-CM | POA: Diagnosis not present

## 2017-05-06 DIAGNOSIS — Q211 Atrial septal defect: Secondary | ICD-10-CM | POA: Diagnosis not present

## 2017-05-06 LAB — CBC WITH DIFFERENTIAL/PLATELET
BASOS ABS: 0 10*3/uL (ref 0.0–0.1)
BASOS PCT: 0 %
EOS ABS: 0.1 10*3/uL (ref 0.0–0.7)
EOS PCT: 2 %
HCT: 32 % — ABNORMAL LOW (ref 39.0–52.0)
HEMOGLOBIN: 11.4 g/dL — AB (ref 13.0–17.0)
Lymphocytes Relative: 9 %
Lymphs Abs: 0.5 10*3/uL — ABNORMAL LOW (ref 0.7–4.0)
MCH: 38 pg — AB (ref 26.0–34.0)
MCHC: 35.6 g/dL (ref 30.0–36.0)
MCV: 106.7 fL — ABNORMAL HIGH (ref 78.0–100.0)
Monocytes Absolute: 0.7 10*3/uL (ref 0.1–1.0)
Monocytes Relative: 11 %
NEUTROS PCT: 79 %
Neutro Abs: 4.9 10*3/uL (ref 1.7–7.7)
PLATELETS: 107 10*3/uL — AB (ref 150–400)
RBC: 3 MIL/uL — AB (ref 4.22–5.81)
RDW: 15.6 % — ABNORMAL HIGH (ref 11.5–15.5)
WBC: 6.2 10*3/uL (ref 4.0–10.5)

## 2017-05-06 LAB — BLOOD CULTURE ID PANEL (REFLEXED)
ACINETOBACTER BAUMANNII: NOT DETECTED
CANDIDA ALBICANS: NOT DETECTED
CANDIDA GLABRATA: NOT DETECTED
CANDIDA KRUSEI: NOT DETECTED
Candida parapsilosis: NOT DETECTED
Candida tropicalis: NOT DETECTED
ENTEROBACTER CLOACAE COMPLEX: NOT DETECTED
ENTEROBACTERIACEAE SPECIES: NOT DETECTED
ENTEROCOCCUS SPECIES: NOT DETECTED
ESCHERICHIA COLI: NOT DETECTED
Haemophilus influenzae: NOT DETECTED
Klebsiella oxytoca: NOT DETECTED
Klebsiella pneumoniae: NOT DETECTED
LISTERIA MONOCYTOGENES: NOT DETECTED
Methicillin resistance: NOT DETECTED
NEISSERIA MENINGITIDIS: NOT DETECTED
PROTEUS SPECIES: NOT DETECTED
Pseudomonas aeruginosa: NOT DETECTED
STAPHYLOCOCCUS SPECIES: DETECTED — AB
STREPTOCOCCUS AGALACTIAE: NOT DETECTED
STREPTOCOCCUS PNEUMONIAE: NOT DETECTED
STREPTOCOCCUS PYOGENES: NOT DETECTED
STREPTOCOCCUS SPECIES: NOT DETECTED
Serratia marcescens: NOT DETECTED
Staphylococcus aureus (BCID): DETECTED — AB

## 2017-05-06 LAB — GLUCOSE, CAPILLARY
GLUCOSE-CAPILLARY: 82 mg/dL (ref 65–99)
GLUCOSE-CAPILLARY: 98 mg/dL (ref 65–99)
GLUCOSE-CAPILLARY: 83 mg/dL (ref 65–99)

## 2017-05-06 LAB — MRSA PCR SCREENING: MRSA by PCR: NEGATIVE

## 2017-05-06 LAB — COMPREHENSIVE METABOLIC PANEL
ALBUMIN: 3.7 g/dL (ref 3.5–5.0)
ALK PHOS: 149 U/L — AB (ref 38–126)
ALT: 21 U/L (ref 17–63)
AST: 24 U/L (ref 15–41)
Anion gap: 6 (ref 5–15)
BUN: 21 mg/dL — ABNORMAL HIGH (ref 6–20)
CHLORIDE: 109 mmol/L (ref 101–111)
CO2: 23 mmol/L (ref 22–32)
CREATININE: 0.93 mg/dL (ref 0.61–1.24)
Calcium: 8.7 mg/dL — ABNORMAL LOW (ref 8.9–10.3)
GFR calc non Af Amer: >60 mL/min (ref >60)
GLUCOSE: 112 mg/dL — AB (ref 65–99)
Potassium: 3.9 mmol/L (ref 3.5–5.1)
SODIUM: 138 mmol/L (ref 135–145)
Total Bilirubin: 3.4 mg/dL — ABNORMAL HIGH (ref 0.3–1.2)
Total Protein: 6.8 g/dL (ref 6.5–8.1)

## 2017-05-06 MED ORDER — INSULIN ASPART 100 UNIT/ML ~~LOC~~ SOLN: 0.0000 [IU] | Freq: Every day | SUBCUTANEOUS | Status: DC

## 2017-05-06 MED ORDER — FERROUS SULFATE 325 (65 FE) MG PO TABS
325.0000 mg | ORAL_TABLET | Freq: Every day | ORAL | Status: DC
  Administered 2017-05-06 – 2017-05-10 (×5): 325 mg via ORAL
  Filled 2017-05-06 (×5): qty 1

## 2017-05-06 MED ORDER — ENOXAPARIN SODIUM 40 MG/0.4ML ~~LOC~~ SOLN
40.0000 mg | SUBCUTANEOUS | Status: DC
  Administered 2017-05-06 – 2017-05-10 (×5): 40 mg via SUBCUTANEOUS
  Filled 2017-05-06 (×5): qty 0.4

## 2017-05-06 MED ORDER — IOPAMIDOL (ISOVUE-300) INJECTION 61%
INTRAVENOUS | Status: AC
  Filled 2017-05-06: qty 75

## 2017-05-06 MED ORDER — CEFAZOLIN SODIUM-DEXTROSE 2-4 GM/100ML-% IV SOLN
2.0000 g | Freq: Three times a day (TID) | INTRAVENOUS | Status: DC
  Administered 2017-05-06 – 2017-05-11 (×14): 2 g via INTRAVENOUS
  Filled 2017-05-06 (×20): qty 100

## 2017-05-06 MED ORDER — INSULIN ASPART 100 UNIT/ML ~~LOC~~ SOLN
0.0000 [IU] | Freq: Three times a day (TID) | SUBCUTANEOUS | Status: DC
  Administered 2017-05-08 – 2017-05-10 (×3): 2 [IU] via SUBCUTANEOUS

## 2017-05-06 MED ORDER — DILTIAZEM HCL ER COATED BEADS 180 MG PO CP24
180.0000 mg | ORAL_CAPSULE | Freq: Every day | ORAL | Status: DC
  Administered 2017-05-06 – 2017-05-10 (×5): 180 mg via ORAL
  Filled 2017-05-06 (×5): qty 1

## 2017-05-06 MED ORDER — KETOROLAC TROMETHAMINE 30 MG/ML IJ SOLN
30.0000 mg | Freq: Four times a day (QID) | INTRAMUSCULAR | Status: DC | PRN
  Administered 2017-05-10: 30 mg via INTRAVENOUS
  Filled 2017-05-06: qty 1

## 2017-05-06 MED ORDER — ACETAMINOPHEN 650 MG RE SUPP: 650.0000 mg | Freq: Four times a day (QID) | RECTAL | Status: DC | PRN

## 2017-05-06 MED ORDER — SERTRALINE HCL 100 MG PO TABS
200.0000 mg | ORAL_TABLET | Freq: Every day | ORAL | Status: DC
Start: 2017-05-07 — End: 2017-05-11
  Administered 2017-05-07 – 2017-05-11 (×4): 200 mg via ORAL
  Filled 2017-05-06: qty 2
  Filled 2017-05-06 (×2): qty 4
  Filled 2017-05-06: qty 2

## 2017-05-06 MED ORDER — DILTIAZEM HCL ER BEADS 180 MG PO CP24: 180.0000 mg | ORAL_CAPSULE | Freq: Every day | ORAL | Status: DC

## 2017-05-06 MED ORDER — ASPIRIN EC 81 MG PO TBEC
81.0000 mg | DELAYED_RELEASE_TABLET | Freq: Every day | ORAL | Status: DC
  Administered 2017-05-06 – 2017-05-10 (×5): 81 mg via ORAL
  Filled 2017-05-06 (×5): qty 1

## 2017-05-06 MED ORDER — METHSCOPOLAMINE BROMIDE 5 MG PO TABS
5.0000 mg | ORAL_TABLET | Freq: Two times a day (BID) | ORAL | Status: DC
  Administered 2017-05-06 – 2017-05-11 (×9): 5 mg via ORAL
  Filled 2017-05-06 (×2): qty 1

## 2017-05-06 MED ORDER — MESALAMINE 1.2 G PO TBEC
1.2000 g | DELAYED_RELEASE_TABLET | Freq: Two times a day (BID) | ORAL | Status: DC
  Administered 2017-05-06 – 2017-05-11 (×9): 1.2 g via ORAL
  Filled 2017-05-06 (×10): qty 1

## 2017-05-06 MED ORDER — CEFAZOLIN SODIUM-DEXTROSE 2-4 GM/100ML-% IV SOLN
2.0000 g | INTRAVENOUS | Status: AC
  Administered 2017-05-06: 2 g via INTRAVENOUS
  Filled 2017-05-06: qty 100

## 2017-05-06 MED ORDER — IOPAMIDOL (ISOVUE-300) INJECTION 61%
75.0000 mL | Freq: Once | INTRAVENOUS | Status: AC | PRN
  Administered 2017-05-06: 75 mL via INTRAVENOUS

## 2017-05-06 MED ORDER — ACETAMINOPHEN 325 MG PO TABS
650.0000 mg | ORAL_TABLET | Freq: Four times a day (QID) | ORAL | Status: DC | PRN
  Administered 2017-05-07 – 2017-05-08 (×2): 650 mg via ORAL
  Filled 2017-05-06 (×2): qty 2

## 2017-05-06 MED ORDER — PANTOPRAZOLE SODIUM 40 MG PO TBEC
40.0000 mg | DELAYED_RELEASE_TABLET | Freq: Every day | ORAL | Status: DC
  Administered 2017-05-07 – 2017-05-11 (×4): 40 mg via ORAL
  Filled 2017-05-06 (×4): qty 1

## 2017-05-06 MED ORDER — ENOXAPARIN SODIUM 40 MG/0.4ML ~~LOC~~ SOLN: 40.0000 mg | SUBCUTANEOUS | Status: DC

## 2017-05-06 MED ORDER — LAMOTRIGINE 100 MG PO TABS
100.0000 mg | ORAL_TABLET | Freq: Two times a day (BID) | ORAL | Status: DC
  Administered 2017-05-06 – 2017-05-11 (×9): 100 mg via ORAL
  Filled 2017-05-06 (×9): qty 1

## 2017-05-06 MED ORDER — POLYETHYLENE GLYCOL 3350 17 G PO PACK
17.0000 g | PACK | Freq: Every day | ORAL | Status: DC | PRN
  Administered 2017-05-09: 17 g via ORAL
  Filled 2017-05-06 (×2): qty 1

## 2017-05-06 NOTE — ED Provider Notes (Signed)
[facility] DEPT Provider Note   CSN: [id number] Arrival date & time: 05/06/17  1042     History   Chief Complaint Chief Complaint  Patient presents with  . staph in urine    HPI [name] [name] is a 56 y.o. male.  HPI 56 year old Caucasian male past medical history significant for melanoma, Crohn's disease, diabetes, hypertension that presents to the emergency department today after being sent here for positive blood cultures.  Patient states that he was seen at [facility] yesterday for fever, abdominal pain and constipation.  Patient with history of Crohn's.  At that time he had a CT scan that showed no intra-abdominal infection.  Urine was consistent with a urinary tract infection.  Patient was started on Levaquin.  Patient had blood cultures that were drawn.  States that he has had a fever for the past 4-5 days.  Patient did have enema performed at hospital yesterday that improved his constipation and he denied pain today.  Patient also states he has not been febrile today.    Patient and wife are contacted by Dr. Tommy Medal with infectious disease for positive blood culture that grew out staph aureus.  They told patient to come to the ED for IV antibiotics and admission.  Patient denies any new complaints today.  Denies any associated chest pain or shortness of breath.  Patient states he has been feeling improved since yesterday.  Patient has not taken any analgesics before coming to the ED.  Patient's urine culture is still pending at this time.  Patient reports history of MRSA in his right elbow that required surgical intervention and IV antibiotics in the past.  Patient denies any history of IV drug use.  Pt denies any fever, chill, ha, vision changes, lightheadedness, dizziness, congestion, neck pain, cp, sob, cough, n/v/d, urinary symptoms, change in bowel habits, melena, hematochezia, lower extremity paresthesias.  Past Medical History:    Diagnosis Date  . Adenomatous colon polyp 10/1999  . Anxiety   . Arthritis    neck, yoga helps.  . Cancer ([place])    skin  . Cataract   . Complex partial seizure ([place])   . Crohn's disease of small and large intestines ([place]) 1999  . Depression   . Diabetes mellitus   . Esophageal stricture   . External hemorrhoids   . GERD (gastroesophageal reflux disease)   . History of kidney stones    3 large stones still present  . History of nuclear stress test 11/15/2010   exercise myoview; normal pattern of perfusion; normal, low risk study   . Hyperlipemia   . Hypertension   . Migraines   . MRSA (methicillin resistant Staphylococcus aureus) 10/2010   . Osteoporosis   . Pneumonia   . PONV (postoperative nausea and vomiting)   . PTSD (post-traumatic stress disorder)   . Seizures ([place])   . Skin cancer    squamous cell multiple; [facility]; followed every 3 months.  . Sleep apnea    CPAP machine, uses nightly    Patient Active Problem List   Diagnosis Date Noted  . Confusion 04/02/2017  . Syncope 08/30/2016  . Depression 04/30/2016  . Memory loss 04/30/2016  . OSA on CPAP 10/31/2015  . Nonepileptic episode ([place]) 04/25/2015  . Seizure ([place]) 04/04/2013  . Abnormal EKG 03/12/2013  . DOE (dyspnea on exertion) 03/12/2013  . Complex partial seizure ([place]) 04/19/2012  . DM2 (diabetes mellitus, type 2) ([place]) 09/25/2011  . Altered mental status  09/25/2011  . OTHER DYSPHAGIA 03/27/2009  . TRANSAMINASES, SERUM, ELEVATED 03/27/2009  . Cough 11/10/2008  . GERD 12/07/2007  . COLONIC POLYPS, ADENOMATOUS, HX OF 12/07/2007  . EXTERNAL HEMORRHOIDS 09/11/2007  . CROHN'S DISEASE, LARGE AND SMALL INTESTINES 09/11/2007  . OSTEOPOROSIS 09/11/2007  . HYPERLIPIDEMIA NEC/NOS 02/27/2007  . Essential hypertension 02/27/2007    Past Surgical History:  Procedure Laterality Date  . CATARACT EXTRACTION Bilateral   . ELBOW SURGERY  2012   elbow MRSA infection   . EYE SURGERY     cataracts removed, /w  IOL  . SHOULDER SURGERY    . SINUS SURGERY WITH INSTATRAK    . TRANSTHORACIC ECHOCARDIOGRAM  02/22/2011   EF 55-65%; increased pattern of LVH with mild conc hypertrophy, abnormal relaxation & increased filling pressure (grade 2 diastolic dysfunction); atrial septum thickened (lipomatous hypertrophy)  . VASECTOMY         Home Medications    Prior to Admission medications   Medication Sig Start Date End Date Taking? Authorizing Provider  aspirin EC 81 MG tablet Take 81 mg at bedtime by mouth.    Yes [provider]  azelastine (ASTELIN) 0.1 % nasal spray Place 2 sprays into both nostrils 2 (two) times daily. Use in each nostril as directed Patient taking differently: Place 2 sprays daily into both nostrils.  05/30/16  Yes Wardell Honour, MD  Calcium Carb-Cholecalciferol (CALCIUM-VITAMIN D) 500-200 MG-UNIT tablet Take 1 tablet 2 (two) times daily by mouth.   Yes [provider]  cholecalciferol (VITAMIN D) 1000 units tablet Take 1,000 Units at bedtime by mouth.    Yes [provider]  diltiazem (TIAZAC) 180 MG 24 hr capsule Take 1 capsule (180 mg total) by mouth daily. 10/21/16  Yes Wardell Honour, MD  eletriptan (RELPAX) 40 MG tablet Take 1 tablet at onset of headache, may repeat in 2 hours if headache persists or reoccurs. Patient taking differently: Take 40 mg by mouth every 2 (two) hours as needed. Take 1 tablet at onset of headache, may repeat in 2 hours if headache persists or reoccurs. 01/18/13  Yes Wendie Agreste, MD  esomeprazole (NEXIUM) 40 MG capsule TAKE 1 CAPSULE TWICE A DAY 12/18/16  Yes Ladene Artist, MD  Eszopiclone 3 MG TABS Take 1 tablet (3 mg total) by mouth at bedtime. Take immediately before bedtime 03/21/17  Yes Wardell Honour, MD  ferrous sulfate 325 (65 FE) MG tablet Take 325 mg at bedtime by mouth.    Yes [provider]  folic acid (FOLVITE) 1 MG tablet Take 2 tablets (2 mg total) by mouth daily. 11/19/16  Yes Wardell Honour, MD    glucose blood (CHOICE DM FORA G20 TEST STRIPS) test strip Use as instructed 11/13/12  Yes Lauenstein, Synetta Shadow, MD  Ibuprofen-Diphenhydramine Cit (ADVIL PM PO) Take 1 tablet at bedtime by mouth.    Yes [provider]  Lactase (DAIRY-RELIEF PO) Take 2 tablets by mouth 2 (two) times daily.    Yes [provider]  lamoTRIgine (LAMICTAL) 100 MG tablet Take 1 tablet (100 mg total) by mouth 2 (two) times daily. 04/25/17  Yes Marcial Pacas, MD  levofloxacin (LEVAQUIN) 750 MG tablet Take 1 tablet (750 mg total) daily for 5 days by mouth. Patient taking differently: Take 750 mg daily by mouth. Started 11/12 for 5 days 05/05/17 05/10/17 Yes Tegeler, Gwenyth Allegra, MD  loratadine (CLARITIN) 10 MG tablet Take 10 mg at bedtime by mouth.    Yes [provider]  mercaptopurine (PURINETHOL) 50 MG tablet TAKE 2 TABLETS DAILY ON AN EMPTY STOMACH 1 HOUR BEFOREOR 2 HOURS AFTER MEALS     (CAUTION: CHEMOTHERAPY) 02/18/17  Yes Ladene Artist, MD  mesalamine (LIALDA) 1.2 g EC tablet Take 2 tablets (2.4 g total) by mouth daily. Patient taking differently: Take 1.2 g 2 (two) times daily by mouth.  02/18/17  Yes Ladene Artist, MD  methscopolamine (PAMINE FORTE) 5 MG tablet Take 1 tablet (5 mg total) by mouth 2 (two) times daily. 02/18/17  Yes Ladene Artist, MD  ONE TOUCH ULTRA TEST test strip USE AS INSTRUCTED   Yes Weber, Sarah L, PA-C  polyethylene glycol (MIRALAX / GLYCOLAX) packet Take 17 g daily by mouth. 05/05/17  Yes Tegeler, Gwenyth Allegra, MD  potassium chloride SA (K-DUR,KLOR-CON) 20 MEQ tablet Take 1 tablet (20 mEq total) by mouth 2 (two) times daily. 08/20/16  Yes Wardell Honour, MD  Probiotic Product (PROBIOTIC PO) Take 1 tablet at bedtime by mouth.    Yes [provider]  sertraline (ZOLOFT) 100 MG tablet Take 200 mg daily with breakfast by mouth.  03/16/17  Yes [provider]  traZODone (DESYREL) 50 MG tablet Take 50 mg at bedtime by mouth.   Yes [provider]  Lacosamide (VIMPAT) 150 MG TABS Take 1 tablet (150 mg total) by mouth 2 (two) times daily. Patient not taking: Reported on 05/06/2017 10/22/16   Dennie Bible, NP  lamoTRIgine (LAMICTAL) 25 MG tablet 1 tablet twice a day for the first week 2 tablets twice a day for the second week 3 tablets twice a day for the third week 4 tablets twice a day for the fourth week  For total of 140 tablets  After finish titration with small dose of lamotrigine 25 mg, change to lamotrigine 100 mg twice a day Patient not taking: Reported on 05/06/2017 04/02/17   Marcial Pacas, MD  traZODone (DESYREL) 100 MG tablet Take 50-100 mg at bedtime by mouth.  04/01/17   [provider]    Family History Family History  Problem Relation Age of Onset  . Colon polyps Father   . Stroke Father   . Hyperlipidemia Father   . Hypertension Father   . Heart disease Mother        CABG at age 5  . Hyperlipidemia Mother   . Hypertension Mother   . Stroke Paternal Grandmother   . Stroke Paternal Grandfather   . Other Child        tetrology of fallot (cornealia deland syndrome)  . Colon cancer Neg Hx     Social History Social History   Tobacco Use  . Smoking status: Never Smoker  . Smokeless tobacco: Never Used  Substance Use Topics  . Alcohol use: No  . Drug use: No     Allergies   Penicillins; Sulfonamide derivatives; Azithromycin; Beef-derived products; Milk-related compounds; and Dilaudid [hydromorphone hcl]   Review of Systems Review of Systems  Constitutional: Positive for chills, fatigue and fever.  HENT: Negative for congestion and sore throat.   Eyes: Negative for visual disturbance.  Respiratory: Negative for cough and shortness of breath.   Cardiovascular: Negative for chest pain.  Gastrointestinal: Positive for constipation. Negative for abdominal pain, blood in stool, diarrhea, nausea and vomiting.  Genitourinary: Negative for dysuria, flank pain, frequency and hematuria.   Musculoskeletal: Negative for arthralgias and myalgias.  Skin: Negative for rash.  Neurological: Negative for dizziness, syncope, weakness, light-headedness, numbness and headaches.  Psychiatric/Behavioral:  Negative for sleep disturbance. The patient is not nervous/anxious.      Physical Exam Updated Vital Signs BP 111/72 (BP Location: Right Arm)   Pulse 72   Temp 98.3 F (36.8 C) (Oral)   Resp 16   Ht 5' 11"  (1.803 m)   Wt 90.7 kg (200 lb)   SpO2 96%   BMI 27.89 kg/m   Physical Exam  Constitutional: He is oriented to person, place, and time. He appears well-developed and well-nourished.  Non-toxic appearance. No distress.  HENT:  Head: Normocephalic and atraumatic.  Mouth/Throat: Oropharynx is clear and moist.  Eyes: Conjunctivae are normal. Pupils are equal, round, and reactive to light. Right eye exhibits no discharge. Left eye exhibits no discharge.  Neck: Normal range of motion. Neck supple.  Cardiovascular: Normal rate, regular rhythm, normal heart sounds and intact distal pulses. Exam reveals no gallop and no friction rub.  No murmur heard. Pulmonary/Chest: Effort normal and breath sounds normal. No stridor. No respiratory distress. He has no wheezes. He has no rales. He exhibits no tenderness.  Abdominal: Soft. Bowel sounds are normal. He exhibits no distension. There is no tenderness. There is no rebound and no guarding.  Musculoskeletal: Normal range of motion. He exhibits no tenderness.  Lymphadenopathy:    He has no cervical adenopathy.  Neurological: He is alert and oriented to person, place, and time.  Skin: Skin is warm and dry. Capillary refill takes less than 2 seconds. No rash noted.  Psychiatric: His behavior is normal. Judgment and thought content normal.  Nursing note and vitals reviewed.    ED Treatments / Results  Labs (all labs ordered are listed, but only abnormal results are displayed) Labs Reviewed  CBC WITH DIFFERENTIAL/PLATELET - Abnormal;  Notable for the following components:      Result Value   RBC 3.00 (*)    Hemoglobin 11.4 (*)    HCT 32.0 (*)    MCV 106.7 (*)    MCH 38.0 (*)    RDW 15.6 (*)    Platelets 107 (*)    Lymphs Abs 0.5 (*)    All other components within normal limits  COMPREHENSIVE METABOLIC PANEL - Abnormal; Notable for the following components:   Glucose, Bld 112 (*)    BUN 21 (*)    Calcium 8.7 (*)    Alkaline Phosphatase 149 (*)    Total Bilirubin 3.4 (*)    All other components within normal limits  CULTURE, BLOOD (ROUTINE X 2)  CULTURE, BLOOD (ROUTINE X 2)    EKG  EKG Interpretation None       Radiology Dg Chest 2 View  Result Date: 05/05/2017 CLINICAL DATA:  A fever for the past 3 days. History of Crohn's disease, hypertension, diabetes, nonsmoker. EXAM: CHEST  2 VIEW COMPARISON:  Chest x-ray of May 14, 2016 FINDINGS: The lungs are adequately inflated. The interstitial markings are coarse though stable. The cardiac silhouette is top-normal in size. The pulmonary vascularity is normal. The mediastinum is normal in width. There is no pleural effusion. The bony thorax exhibits no acute abnormality. IMPRESSION: Chronic interstitial prominence bilaterally. No alveolar pneumonia, CHF, nor other acute cardiopulmonary abnormality. Electronically Signed   By: David  Martinique M.D.   On: 05/05/2017 08:41   Ct Abdomen Pelvis W Contrast  Result Date: 05/05/2017 CLINICAL DATA:  History of Crohn's disease.  Constipation and pain EXAM: CT ABDOMEN AND PELVIS WITH CONTRAST TECHNIQUE: Multidetector CT imaging of the abdomen and pelvis was performed using the standard protocol  following bolus administration of intravenous contrast. Oral contrast was also administered. CONTRAST:  145m ISOVUE-300 IOPAMIDOL (ISOVUE-300) INJECTION 61% COMPARISON:  July 22, 2016 FINDINGS: Lower chest: There is bibasilar lung atelectasis, primarily on the right posteriorly. Hepatobiliary: No focal liver lesions are evident.  Gallbladder wall is not appreciably thickened. There is no biliary duct dilatation. Pancreas: No pancreatic mass or inflammatory focus. Spleen: Spleen is enlarged, measuring 18.1 x 17.1 x 7.4 cm with a measured splenic volume of 1,145 cubic cm. No focal splenic lesions are evident. Adrenals/Urinary Tract: Adrenals appear normal bilaterally. There is a cyst arising from the posterior upper pole left kidney measuring 2.5 x 2.9 cm. There is a second cyst laterally on the left measuring 2.8 x 2.8 cm. On the right inferiorly and medially, there is an area of decreased attenuation measuring 2.3 x 2.3 cm which raises concern for a noncystic mass. There is no hydronephrosis on either side. There is a calculus in the left mid kidney measuring 1.3 x 0.7 cm. A second calculus on the left measures 1.1 x 0.7 cm. There is a 3 mm calculus in this area as well. There are no ureteral calculi on either side. Urinary bladder is midline with wall thickness within normal limits. Stomach/Bowel: There are scattered colonic diverticula without diverticulitis. There is no appreciable bowel wall or mesenteric thickening. There is moderate fluid throughout the colon without dilatation. No mesenteric thickening evident. No fistula noted. No bowel obstruction. No free air or portal venous air evident. Vascular/Lymphatic: There are foci of atherosclerotic calcification in the aorta and common iliac arteries. No aneurysm evident. Major mesenteric vessels appear patent. There is no adenopathy appreciable in the abdomen or pelvis. Reproductive: Prostate and seminal vesicles appear normal in size and contour. No pelvic mass evident. Other: There is no periappendiceal inflammation. There is scarring in the umbilical region anteriorly. There is a fluid collection in this area with attenuation value higher than is expected with serous fluid. This collection is smaller than on prior study, currently measuring 4.9 x 4.7 x 2.0 cm. There is no air in this  collection. There is no abscess or ascites evident in the abdomen or pelvis. There is fat in each inguinal ring. Musculoskeletal: No blastic or lytic bone lesions. There is degenerative change in the lower thoracic and lumbar spine. No intramuscular lesions are evident. IMPRESSION: 1. Within the right kidney, there is an area of decreased attenuation measuring 2.3 x 2.3 cm in the lower pole region inferiorly that cannot be classified as a cyst. Further assessment advised nonemergently. Further evaluation with pre and post contrast MRI or CT should be considered. MRI is preferred in younger patients (due to lack of ionizing radiation) and for evaluating calcified lesion(s). 2. Several nonobstructing calculi in left kidney. No hydronephrosis or ureteral calculus on either side. 3.  Splenomegaly of uncertain etiology. 4. Sigmoid diverticula without diverticulitis. No bowel obstruction. No bowel wall thickening. No fistula. 5. Scarring at the level of a previously repaired umbilical hernia. Fluid in this area likely represents partially resolving and liquefying chronic hematoma. 6.  Mild aortic atherosclerosis. Aortic Atherosclerosis (ICD10-I70.0). Electronically Signed   By: WLowella GripIII M.D.   On: 05/05/2017 10:50    Procedures Procedures (including critical care time)  Medications Ordered in ED Medications  ceFAZolin (ANCEF) IVPB 2g/100 mL premix (2 g Intravenous New Bag/Given 05/06/17 1221)  ceFAZolin (ANCEF) IVPB 2g/100 mL premix (not administered)     Initial Impression / Assessment and Plan / ED Course  I have reviewed the triage vital signs and the nursing notes.  Pertinent labs & imaging results that were available during my care of the patient were reviewed by me and considered in my medical decision making (see chart for details).     Patient presents to the ED after being sent by infectious disease for positive blood cultures that were drawn yesterday during his ED visit for  constipation and abdominal pain.  Patient had positive staph aureus blood cultures.  He was diagnosed to UTI yesterday and was started on Levaquin which she has been taking.  Patient states that he is not having any urinary symptoms at this time.  Patient denies any fevers today but does report fevers over the past 3-4 days.  On exam patient is overall well-appearing and nontoxic.  Vital signs reassuring.  Patient is afebrile no hypotension noted.  No tachycardia noted.  Lab work was redrawn along with blood cultures.  Did speak with pharmacy who states that the cultures were methicillin susceptible staph aureus.  They recommend Ancef.  This was given.  Will consult hospital medicine for admission.  Spoke wth Dr. Wyline Copas with hospital medicine who agrees to admission and will place admission orders.  Patient remains hemodynamically stable at this time.  Final Clinical Impressions(s) / ED Diagnoses   Final diagnoses:  Bacteremia    ED Discharge Orders    None       Aaron Edelman 05/06/17 1342    Tegeler, Gwenyth Allegra, MD 05/06/17 2040

## 2017-05-06 NOTE — Consult Note (Signed)
Date of Admission:  05/06/2017          Reason for Consult: MSSA bacteremia with sepsis    Referring Provider: Terrilyn Saver "auto-consult" and Dr. Wyline Copas   Assessment: 1. MSSA  Bacteremia with likely seeding of the urine (rather than other way around) 2. Multiple skin lesions being treated with topical chemotherapeutic agents that could have been portals of entry 3. [name] 4. Recent rib fractures 5. Recent constipation 6. Recent UTI  Plan: 1. Start IV ancef 2. Get CT chest to evaluate for possible infection there near rib fractures 3. Check TTE and will need TEE  4. Repeat blood cultures tomorrow am 5. He will need blood cultures NGTD at 72-96 hours prior to placement of central line to treat his MSSA bacteremia which will require 4-6 weeks of therapy  Principal Problem:   Bacteremia due to Staphylococcus aureus Active Problems:   Essential hypertension   CROHN'S DISEASE, LARGE AND SMALL INTESTINES   DM2 (diabetes mellitus, type 2) (HCC)   OSA on CPAP   Staphylococcus aureus bacteremia   Scheduled Meds: . aspirin EC  81 mg Oral QHS  . diltiazem  180 mg Oral QHS  . enoxaparin (LOVENOX) injection  40 mg Subcutaneous Q24H  . ferrous sulfate  325 mg Oral QHS  . insulin aspart  0-15 Units Subcutaneous TID WC  . insulin aspart  0-5 Units Subcutaneous QHS  . iopamidol      . lamoTRIgine  100 mg Oral BID  . mesalamine  1.2 g Oral BID  . methscopolamine  5 mg Oral BID  . [START ON 05/07/2017] pantoprazole  40 mg Oral Daily  . [START ON 05/07/2017] sertraline  200 mg Oral Q breakfast   Continuous Infusions: .  ceFAZolin (ANCEF) IV     PRN Meds:.acetaminophen **OR** acetaminophen, ketorolac, polyethylene glycol  HPI: [name] [name] is a 56 y.o. male Crohn's disease well controlled, also with hx of cognitive problems, Complex seizures, kidney stones who recently suffered fractured ribs on right side when sudden jerk from dogs he was walking caused him to strike the  ground. He took a dose of percocet for this  and afterwards begain complaining of severe constipation. He also began having polyuria, darker urine and some dysuria and was seen at [facility] for UTI.   He also recently saw his Dermatologist Dr. Elvera Lennox for treatment of his skin cancers and has had chemotherapeutic agents applied to these areas that are irritated and which he keeps covered up for cosmetic reasons.   None have been overtly purulent but they are open wounds.   He then developed abrupt onset of fevers, nausea, with constipation. He had labs done in ED including UA which showed pyuria though he did not have dysuria. Blood cultures were also taken. He had CT abdomen that did not show new pathology beyond lesion in the kidney. He has cirrhosis on imaging prior to this and also has mild splenomegaly along with kidney stones. He was DC on levaquin.  Blood cultures fired + for MSSA by [facility] this am and I made phone calls to pts number, wifes, mobile, work phone and daughter counsellng for him to be admitted to the hospital. Wife promptly brought him in. He is being admitted to [facility] to Dr. Rhetta Mura service.  I spent greater than 80 minutes with the patient including greater than 50% of time in face to face counsel of the patient and his wife re severity and  high mortality of Staph aureus bacteremia, need for aggressive workup by [place] in ID and in coordination of his care with Dr. Wyline Copas and ID pharmacy.    Review of Systems: Review of Systems  Constitutional: Positive for chills and fever. Negative for malaise/fatigue and weight loss.  HENT: Negative for congestion and sore throat.   Eyes: Negative for blurred vision and photophobia.  Respiratory: Negative for cough, shortness of breath and wheezing.   Cardiovascular: Negative for chest pain, palpitations and leg swelling.  Gastrointestinal: Positive for abdominal pain and nausea. Negative for blood in stool, constipation, diarrhea,  heartburn, melena and vomiting.  Genitourinary: Positive for frequency. Negative for dysuria, flank pain and hematuria.  Musculoskeletal: Negative for back pain, falls, joint pain and myalgias.  Skin: Negative for itching and rash.  Neurological: Negative for dizziness, focal weakness, loss of consciousness, weakness and headaches.  Endo/Heme/Allergies: Does not bruise/bleed easily.  Psychiatric/Behavioral: Positive for memory loss. Negative for depression and suicidal ideas. The patient does not have insomnia.     Past Medical History:  Diagnosis Date  . Adenomatous colon polyp 10/1999  . Anxiety   . Arthritis    neck, yoga helps.  . Cancer ([place])    skin  . Cataract   . Complex partial seizure ([place])   . Crohn's disease of small and large intestines ([place]) 1999  . Depression   . Diabetes mellitus   . Esophageal stricture   . External hemorrhoids   . GERD (gastroesophageal reflux disease)   . History of kidney stones    3 large stones still present  . History of nuclear stress test 11/15/2010   exercise myoview; normal pattern of perfusion; normal, low risk study   . Hyperlipemia   . Hypertension   . Migraines   . MRSA (methicillin resistant Staphylococcus aureus) 10/2010   . Osteoporosis   . Pneumonia   . PONV (postoperative nausea and vomiting)   . PTSD (post-traumatic stress disorder)   . Seizures ([place])   . Skin cancer    squamous cell multiple; [place]; followed every 3 months.  . Sleep apnea    CPAP machine, uses nightly    Social History   Tobacco Use  . Smoking status: Never Smoker  . Smokeless tobacco: Never Used  Substance Use Topics  . Alcohol use: No  . Drug use: No    Family History  Problem Relation Age of Onset  . Colon polyps Father   . Stroke Father   . Hyperlipidemia Father   . Hypertension Father   . Heart disease Mother        CABG at age 12  . Hyperlipidemia Mother   . Hypertension Mother   . Stroke Paternal Grandmother   . Stroke  Paternal Grandfather   . Other Child        tetrology of fallot (cornealia deland syndrome)  . Colon cancer Neg Hx    Allergies  Allergen Reactions  . Penicillins Hives     Has patient had a PCN reaction causing immediate rash, facial/tongue/throat swelling, SOB or lightheadedness with hypotension: # # # YES # # #  Has patient had a PCN reaction causing severe rash involving mucus membranes or skin necrosis: # # UNKNOWN # #  Has patient had a PCN reaction that required hospitalization  . # # NO # #  Has patient had a PCN reaction occurring within the last 10 years:  # # NO # #  If all of the above answers are "  NO", then may proceed with Cephalosporin use.   . Sulfonamide Derivatives Hives  . Azithromycin Swelling    SWELLING REACTION UNSPECIFIED   . Beef-Derived Products Diarrhea    RED MEAT > UNSPECIFIED REACTION   . Milk-Related Compounds Diarrhea    DAIRY >UNSPECIFIED REACTION   . Dilaudid [Hydromorphone Hcl] Nausea And Vomiting    OBJECTIVE: Blood pressure 110/68, pulse 68, temperature 98.8 F (37.1 C), temperature source Oral, resp. rate 18, height 5' 11"  (1.803 m), weight 200 lb (90.7 kg), SpO2 97 %.  Physical Exam  Constitutional: He is oriented to person, place, and time and well-developed, well-nourished, and in no distress. No distress.  HENT:  Head: Normocephalic and atraumatic.  Right Ear: External ear normal.  Left Ear: External ear normal.  Nose: Nose normal.  Mouth/Throat: Oropharynx is clear and moist. No oropharyngeal exudate.  Eyes: Conjunctivae and EOM are normal. Pupils are equal, round, and reactive to light. No scleral icterus.  Neck: Normal range of motion. Neck supple.  Cardiovascular: Normal rate, regular rhythm and normal heart sounds. Exam reveals no gallop and no friction rub.  No murmur heard. Pulmonary/Chest: Effort normal and breath sounds normal. No respiratory distress. He has no wheezes. He has no rales.  Abdominal: Soft. Bowel sounds are  normal. He exhibits no distension and no mass. There is no tenderness. There is no rebound and no guarding.  Musculoskeletal: Normal range of motion. He exhibits no edema or tenderness.  Lymphadenopathy:    He has no cervical adenopathy.  Neurological: He is alert and oriented to person, place, and time. Gait normal. Coordination normal.  Skin: Skin is warm and dry. No rash noted. He is not diaphoretic. No erythema. No pallor.  Psychiatric: Mood, memory, affect and judgment normal.  Nursing note and vitals reviewed.  Skin lesions that are being treated with effudex  05/06/17:            Lab Results Lab Results  Component Value Date   WBC 6.2 05/06/2017   HGB 11.4 (L) 05/06/2017   HCT 32.0 (L) 05/06/2017   MCV 106.7 (H) 05/06/2017   PLT 107 (L) 05/06/2017    Lab Results  Component Value Date   CREATININE 0.93 05/06/2017   BUN 21 (H) 05/06/2017   NA 138 05/06/2017   K 3.9 05/06/2017   CL 109 05/06/2017   CO2 23 05/06/2017    Lab Results  Component Value Date   ALT 21 05/06/2017   AST 24 05/06/2017   ALKPHOS 149 (H) 05/06/2017   BILITOT 3.4 (H) 05/06/2017     Microbiology: Recent Results (from the past 240 hour(s))  Blood culture (routine x 2)     Status: None (Preliminary result)   Collection Time: 05/05/17  8:20 AM  Result Value Ref Range Status   Specimen Description BLOOD RIGHT ARM  Final   Special Requests   Final    BOTTLES DRAWN AEROBIC AND ANAEROBIC Blood Culture adequate volume   Culture  Setup Time   Final    GRAM POSITIVE COCCI AEROBIC BOTTLE ONLY CRITICAL VALUE NOTED.  VALUE IS CONSISTENT WITH PREVIOUSLY REPORTED AND CALLED VALUE. Performed at [facility], [place] [address], [place], [place] [address]    Culture GRAM POSITIVE COCCI  Final   Report Status PENDING  Incomplete  Blood culture (routine x 2)     Status: None (Preliminary result)   Collection Time: 05/05/17  8:25 AM  Result Value Ref Range Status   Specimen Description BLOOD  LEFT ARM  Final   Special Requests   Final    BOTTLES DRAWN AEROBIC AND ANAEROBIC Blood Culture adequate volume   Culture  Setup Time   Final    GRAM POSITIVE COCCI IN CLUSTERS IN BOTH AEROBIC AND ANAEROBIC BOTTLES CRITICAL RESULT CALLED TO, READ BACK BY AND VERIFIED WITH: MSIMMS,RN @0826  05/06/17 BY LHOWARD Performed at [facility], [place] [address] [place], [place] [address]    Culture GRAM POSITIVE COCCI  Final   Report Status PENDING  Incomplete  Blood Culture ID Panel (Reflexed)     Status: Abnormal   Collection Time: 05/05/17  8:25 AM  Result Value Ref Range Status   Enterococcus species NOT DETECTED NOT DETECTED Final   Listeria monocytogenes NOT DETECTED NOT DETECTED Final   Staphylococcus species DETECTED (A) NOT DETECTED Final    Comment: CRITICAL RESULT CALLED TO, READ BACK BY AND VERIFIED WITH: MSIMMS,RN @0826  05/06/17 BY LHOWARD    Staphylococcus aureus DETECTED (A) NOT DETECTED Final    Comment: Methicillin (oxacillin) susceptible Staphylococcus aureus (MSSA). Preferred therapy is anti staphylococcal beta lactam antibiotic (Cefazolin or Nafcillin), unless clinically contraindicated. CRITICAL RESULT CALLED TO, READ BACK BY AND VERIFIED WITH: MSIMMS,RN @0826  05/06/17 BY LHOWARD    Methicillin resistance NOT DETECTED NOT DETECTED Final   Streptococcus species NOT DETECTED NOT DETECTED Final   Streptococcus agalactiae NOT DETECTED NOT DETECTED Final   Streptococcus pneumoniae NOT DETECTED NOT DETECTED Final   Streptococcus pyogenes NOT DETECTED NOT DETECTED Final   Acinetobacter baumannii NOT DETECTED NOT DETECTED Final   Enterobacteriaceae species NOT DETECTED NOT DETECTED Final   Enterobacter cloacae complex NOT DETECTED NOT DETECTED Final   Escherichia coli NOT DETECTED NOT DETECTED Final   Klebsiella oxytoca NOT DETECTED NOT DETECTED Final   Klebsiella pneumoniae NOT DETECTED NOT DETECTED Final   Proteus species NOT DETECTED NOT DETECTED Final   Serratia  marcescens NOT DETECTED NOT DETECTED Final   Haemophilus influenzae NOT DETECTED NOT DETECTED Final   Neisseria meningitidis NOT DETECTED NOT DETECTED Final   Pseudomonas aeruginosa NOT DETECTED NOT DETECTED Final   Candida albicans NOT DETECTED NOT DETECTED Final   Candida glabrata NOT DETECTED NOT DETECTED Final   Candida krusei NOT DETECTED NOT DETECTED Final   Candida parapsilosis NOT DETECTED NOT DETECTED Final   Candida tropicalis NOT DETECTED NOT DETECTED Final    Comment: Performed at [facility], [place] [address], [place], [place] [address]  Urine culture     Status: Abnormal (Preliminary result)   Collection Time: 05/05/17  9:15 AM  Result Value Ref Range Status   Specimen Description URINE, CLEAN CATCH  Final   Special Requests NONE  Final   Culture (A)  Final    >=100,000 COLONIES/mL STAPHYLOCOCCUS AUREUS SUSCEPTIBILITIES TO FOLLOW Performed at [facility], [place] [address], [place], [place] [address]    Report Status PENDING  Incomplete    Alcide Evener, [facility] [place] [phone] pager   [phone] cell 05/06/2017, 3:59 PM

## 2017-05-06 NOTE — Telephone Encounter (Signed)
I have called the patients home number, mobile number (appears to be wife's number) and daughters number and left messages VM on each that he needs to get back to the hospital ASAP for critical illness due to blood stream infection with Staph aureus

## 2017-05-06 NOTE — Progress Notes (Signed)
Pharmacy Antibiotic Note  [name] [name] is a 56 y.o. male admitted on 05/06/2017 with bacteremia.  Pharmacy has been consulted for cefazolin dosing. Sent back to ED after discovery of positive blood cultures (MSSA per [facility] results).  Patient told EDP that hives to PCN occurred while a child.  There is documentation of a prescription written for cefdinir in 2014.  Appears appropriate to challenge with cefazolin due to low probability of cross-reactivity.  Started on Levofloxacin 11/12  11/12 UCx and BCx with S. Aureus ([facility] - MSSA) - exact source unknown (? UTI vs blood seeded urine)  Plan:  Cefazolin 2gm IV q8h  Monitor for possible reaction  Monitor renal function  Repeat BCx ordered  Await w/u for source (TTE and/or TEE, etc)  Pharmacy will follow peripherally  Height: 5' 11"  (180.3 cm) Weight: 200 lb (90.7 kg) IBW/kg (Calculated) : 75.3  Temp (24hrs), Avg:98.3 F (36.8 C), Min:98.3 F (36.8 C), Max:98.3 F (36.8 C)  Recent Labs  Lab 05/05/17 0821 05/05/17 0833  WBC 8.7  --   CREATININE 0.69  --   LATICACIDVEN  --  1.12    Estimated Creatinine Clearance: 120.3 mL/min (by C-G formula based on SCr of 0.69 mg/dL).    Allergies  Allergen Reactions  . Penicillins Hives     Has patient had a PCN reaction causing immediate rash, facial/tongue/throat swelling, SOB or lightheadedness with hypotension: # # # YES # # #  Has patient had a PCN reaction causing severe rash involving mucus membranes or skin necrosis: # # UNKNOWN # #  Has patient had a PCN reaction that required hospitalization  . # # NO # #  Has patient had a PCN reaction occurring within the last 10 years:  # # NO # #  If all of the above answers are "NO", then may proceed with Cephalosporin use.   . Sulfonamide Derivatives Hives  . Azithromycin Swelling    SWELLING REACTION UNSPECIFIED   . Beef-Derived Products     RED MEAT > UNSPECIFIED REACTION   . Milk-Related Compounds     DAIRY >UNSPECIFIED REACTION    . Dilaudid [Hydromorphone Hcl] Nausea And Vomiting    Antimicrobials this admission: 11/13  Cefazolin >>  Dose adjustments this admission:   Microbiology results: 11/12 BCx: GPC clusters (BCID = MSSA) 11/12 UCx: S. aureus   Thank you for allowing pharmacy to be a part of this patient's care.  Doreene Eland, PharmD, BCPS.   Pager: [phone] 05/06/2017 12:10 PM

## 2017-05-06 NOTE — Progress Notes (Signed)
Patient arrived on unit from ED.  Wife at bedside.

## 2017-05-06 NOTE — Telephone Encounter (Signed)
Pt   Wife   Called   Stating  She  Had  Received  A  Message   That  Her  Husband    Was  Advised  By  Dr  Lucianne Lei  damme   Today  That  His  Leg  Was   Growing  A   Staph  Infection  And that  He   Needed  To  Go  To  The  Hospital  Today  She  Inquired   If he  Could  Be  admitted  Directly   She  Was  Offered  To  Try  To  Work an  appt  In today  With  her  Provider  To see  If that  Was  Possible  She  Declined   And  Will  Take  Him to  Er  Now

## 2017-05-06 NOTE — H&P (Signed)
History and Physical    [name] [name] LPF:[id number] DOB: 01-11-1961 DOA: 05/06/2017  PCP: Wardell Honour, MD  Patient coming from: Home  Chief Complaint: Patient instructed to go to hospital because of bacteremia  HPI: [name] [name] is a 56 y.o. male with medical history significant of Crohn's disease involving small and large intestines, seizure disorder, arthritis, diabetes, depression, hypertension, hyperlipidemia who was recently seen at [facility] for abdominal pains.  Patient underwent CT abdomen pelvis which had findings concerning for constipation.  Patient was given a bowel regimen with market improvement of his abdominal discomfort.  An incidental 2.3 x 2.3 cm lower pole lesion of decreased attenuation was noted.  Recommendations were for pre-and postcontrast MRI or CT.  During that visit, patient was found to have a urinalysis suggestive of a urinary tract infection with pan cultures obtained.  Patient was discharged from the emergency department with levofloxacin.  On the day of hospital admission, patient was informed that his blood culture was positive for staph aureus and to present to the emergency department.  ED Course: The emergency department, patient was found to have a white blood count of 6.2.  Patient was afebrile.  Repeat blood cultures were obtained, currently pending.  ED physician has started the patient on empiric Ancef after discussion with pharmacy.  Infectious disease was consulted.  Hospitalist service was consulted for consideration for admission  Review of Systems:  Review of Systems  Constitutional: Negative for chills, fever and malaise/fatigue.  HENT: Negative for congestion, ear discharge, ear pain and nosebleeds.   Eyes: Negative for double vision, photophobia and pain.  Respiratory: Negative for hemoptysis, sputum production and shortness of breath.   Cardiovascular: Negative for palpitations, orthopnea and claudication.  Gastrointestinal:  Negative for abdominal pain, diarrhea, nausea and vomiting.  Genitourinary: Negative for frequency, hematuria and urgency.  Musculoskeletal: Negative for back pain and neck pain.  Neurological: Negative for tingling, tremors, seizures, loss of consciousness and weakness.  Psychiatric/Behavioral: Negative for hallucinations, memory loss and substance abuse.    Past Medical History:  Diagnosis Date  . Adenomatous colon polyp 10/1999  . Anxiety   . Arthritis    neck, yoga helps.  . Cancer ([place])    skin  . Cataract   . Complex partial seizure ([place])   . Crohn's disease of small and large intestines ([place]) 1999  . Depression   . Diabetes mellitus   . Esophageal stricture   . External hemorrhoids   . GERD (gastroesophageal reflux disease)   . History of kidney stones    3 large stones still present  . History of nuclear stress test 11/15/2010   exercise myoview; normal pattern of perfusion; normal, low risk study   . Hyperlipemia   . Hypertension   . Migraines   . MRSA (methicillin resistant Staphylococcus aureus) 10/2010   . Osteoporosis   . Pneumonia   . PONV (postoperative nausea and vomiting)   . PTSD (post-traumatic stress disorder)   . Seizures ([place])   . Skin cancer    squamous cell multiple; [place]; followed every 3 months.  . Sleep apnea    CPAP machine, uses nightly    Past Surgical History:  Procedure Laterality Date  . CATARACT EXTRACTION Bilateral   . ELBOW SURGERY  2012   elbow MRSA infection   . EYE SURGERY     cataracts removed, /w IOL  . SHOULDER SURGERY    . SINUS SURGERY WITH INSTATRAK    . TRANSTHORACIC ECHOCARDIOGRAM  02/22/2011   EF 55-65%; increased pattern of LVH with mild conc hypertrophy, abnormal relaxation & increased filling pressure (grade 2 diastolic dysfunction); atrial septum thickened (lipomatous hypertrophy)  . VASECTOMY       reports that  has never smoked. he has never used smokeless tobacco. He reports that he does not drink alcohol  or use drugs.  Allergies  Allergen Reactions  . Penicillins Hives     Has patient had a PCN reaction causing immediate rash, facial/tongue/throat swelling, SOB or lightheadedness with hypotension: # # # YES # # #  Has patient had a PCN reaction causing severe rash involving mucus membranes or skin necrosis: # # UNKNOWN # #  Has patient had a PCN reaction that required hospitalization  . # # NO # #  Has patient had a PCN reaction occurring within the last 10 years:  # # NO # #  If all of the above answers are "NO", then may proceed with Cephalosporin use.   . Sulfonamide Derivatives Hives  . Azithromycin Swelling    SWELLING REACTION UNSPECIFIED   . Beef-Derived Products Diarrhea    RED MEAT > UNSPECIFIED REACTION   . Milk-Related Compounds Diarrhea    DAIRY >UNSPECIFIED REACTION   . Dilaudid [Hydromorphone Hcl] Nausea And Vomiting    Family History  Problem Relation Age of Onset  . Colon polyps Father   . Stroke Father   . Hyperlipidemia Father   . Hypertension Father   . Heart disease Mother        CABG at age 63  . Hyperlipidemia Mother   . Hypertension Mother   . Stroke Paternal Grandmother   . Stroke Paternal Grandfather   . Other Child        tetrology of fallot (cornealia deland syndrome)  . Colon cancer Neg Hx     Prior to Admission medications   Medication Sig Start Date End Date Taking? Authorizing Provider  Ascorbic Acid (VITAMIN C PO) Take 1 tablet at bedtime by mouth.   Yes [provider]  aspirin EC 81 MG tablet Take 81 mg at bedtime by mouth.    Yes [provider]  azelastine (ASTELIN) 0.1 % nasal spray Place 2 sprays into both nostrils 2 (two) times daily. Use in each nostril as directed Patient taking differently: Place 2 sprays daily into both nostrils.  05/30/16  Yes Wardell Honour, MD  Calcium Carb-Cholecalciferol (CALCIUM-VITAMIN D) 500-200 MG-UNIT tablet Take 1 tablet 2 (two) times daily by mouth.   Yes [provider]    cholecalciferol (VITAMIN D) 1000 units tablet Take 1,000 Units at bedtime by mouth.    Yes [provider]  diltiazem (TIAZAC) 180 MG 24 hr capsule Take 1 capsule (180 mg total) by mouth daily. 10/21/16  Yes Wardell Honour, MD  eletriptan (RELPAX) 40 MG tablet Take 1 tablet at onset of headache, may repeat in 2 hours if headache persists or reoccurs. Patient taking differently: Take 40 mg by mouth every 2 (two) hours as needed. Take 1 tablet at onset of headache, may repeat in 2 hours if headache persists or reoccurs. 01/18/13  Yes Wendie Agreste, MD  esomeprazole (NEXIUM) 40 MG capsule TAKE 1 CAPSULE TWICE A DAY 12/18/16  Yes Ladene Artist, MD  Eszopiclone 3 MG TABS Take 1 tablet (3 mg total) by mouth at bedtime. Take immediately before bedtime 03/21/17  Yes Wardell Honour, MD  ferrous sulfate 325 (65 FE) MG tablet Take 325 mg at  bedtime by mouth.    Yes [provider]  fluorouracil (EFUDEX) 5 % cream Apply 1 application at bedtime topically. For Basal Cell Carinoma of arms    Yes [provider]  folic acid (FOLVITE) 1 MG tablet Take 2 tablets (2 mg total) by mouth daily. 11/19/16  Yes Wardell Honour, MD  glucose blood (CHOICE DM FORA G20 TEST STRIPS) test strip Use as instructed 11/13/12  Yes Lauenstein, Synetta Shadow, MD  Ibuprofen-Diphenhydramine Cit (ADVIL PM PO) Take 1 tablet at bedtime by mouth.    Yes [provider]  Lactase (DAIRY-RELIEF PO) Take 2 tablets by mouth 2 (two) times daily.    Yes [provider]  lamoTRIgine (LAMICTAL) 100 MG tablet Take 1 tablet (100 mg total) by mouth 2 (two) times daily. 04/25/17  Yes Marcial Pacas, MD  levofloxacin (LEVAQUIN) 750 MG tablet Take 1 tablet (750 mg total) daily for 5 days by mouth. Patient taking differently: Take 750 mg daily by mouth. Started 11/12 for 5 days 05/05/17 05/10/17 Yes Tegeler, Gwenyth Allegra, MD  loratadine (CLARITIN) 10 MG tablet Take 10 mg at bedtime by mouth.    Yes [provider]   mercaptopurine (PURINETHOL) 50 MG tablet TAKE 2 TABLETS DAILY ON AN EMPTY STOMACH 1 HOUR BEFOREOR 2 HOURS AFTER MEALS     (CAUTION: CHEMOTHERAPY) 02/18/17  Yes Ladene Artist, MD  mesalamine (LIALDA) 1.2 g EC tablet Take 2 tablets (2.4 g total) by mouth daily. Patient taking differently: Take 1.2 g 2 (two) times daily by mouth.  02/18/17  Yes Ladene Artist, MD  methscopolamine (PAMINE FORTE) 5 MG tablet Take 1 tablet (5 mg total) by mouth 2 (two) times daily. 02/18/17  Yes Ladene Artist, MD  ONE TOUCH ULTRA TEST test strip USE AS INSTRUCTED   Yes Weber, Sarah L, PA-C  polyethylene glycol (MIRALAX / GLYCOLAX) packet Take 17 g daily by mouth. 05/05/17  Yes Tegeler, Gwenyth Allegra, MD  potassium chloride SA (K-DUR,KLOR-CON) 20 MEQ tablet Take 1 tablet (20 mEq total) by mouth 2 (two) times daily. 08/20/16  Yes Wardell Honour, MD  Probiotic Product (PROBIOTIC PO) Take 1 tablet at bedtime by mouth.    Yes [provider]  sertraline (ZOLOFT) 100 MG tablet Take 200 mg daily with breakfast by mouth.  03/16/17  Yes [provider]  traZODone (DESYREL) 50 MG tablet Take 50 mg at bedtime by mouth.   Yes [provider]  Lacosamide (VIMPAT) 150 MG TABS Take 1 tablet (150 mg total) by mouth 2 (two) times daily. Patient not taking: Reported on 05/06/2017 10/22/16   Dennie Bible, NP  lamoTRIgine (LAMICTAL) 25 MG tablet 1 tablet twice a day for the first week 2 tablets twice a day for the second week 3 tablets twice a day for the third week 4 tablets twice a day for the fourth week  For total of 140 tablets  After finish titration with small dose of lamotrigine 25 mg, change to lamotrigine 100 mg twice a day Patient not taking: Reported on 05/06/2017 04/02/17   Marcial Pacas, MD  traZODone (DESYREL) 100 MG tablet Take 50-100 mg at bedtime by mouth.  04/01/17   [provider]    Physical Exam: Vitals:   05/06/17 1055 05/06/17 1056 05/06/17 1146 05/06/17 1254   BP: 120/69  111/72 108/74  Pulse: 78  72 70  Resp: 17  16 16   Temp: 98.3 F (36.8 C)     TempSrc: Oral  SpO2: 98%  96% 95%  Weight:  90.7 kg (200 lb)    Height:  5' 11"  (1.803 m)      Constitutional: NAD, calm, comfortable Vitals:   05/06/17 1055 05/06/17 1056 05/06/17 1146 05/06/17 1254  BP: 120/69  111/72 108/74  Pulse: 78  72 70  Resp: 17  16 16   Temp: 98.3 F (36.8 C)     TempSrc: Oral     SpO2: 98%  96% 95%  Weight:  90.7 kg (200 lb)    Height:  5' 11"  (1.803 m)     Eyes: PERRL, lids and conjunctivae normal ENMT: Mucous membranes are moist. Posterior pharynx clear of any exudate or lesions.Normal dentition.  Neck: normal, supple, no masses, no thyromegaly Respiratory: clear to auscultation bilaterally, no wheezing, no crackles. Normal respiratory effort. No accessory muscle use.  Cardiovascular: Regular rate and rhythm, no murmurs / rubs / gallops. No extremity edema. 2+ pedal pulses. No carotid bruits.  Abdomen: no tenderness, no masses palpated. No hepatosplenomegaly. Bowel sounds positive.  Musculoskeletal: no clubbing / cyanosis. No joint deformity upper and lower extremities. Good ROM, no contractures. Normal muscle tone.  Skin: no rashes, lesions, ulcers. No induration Neurologic: CN 2-12 grossly intact. Sensation intact, DTR normal. Strength 5/5 in all 4.  Psychiatric: Normal judgment and insight. Alert and oriented x 3. Normal mood.    Labs on Admission: I have personally reviewed following labs and imaging studies  CBC: Recent Labs  Lab 05/05/17 0821 05/06/17 1140  WBC 8.7 6.2  NEUTROABS 7.2 4.9  HGB 11.5* 11.4*  HCT 32.7* 32.0*  MCV 108.3* 106.7*  PLT 105* 379*   Basic Metabolic Panel: Recent Labs  Lab 05/05/17 0821 05/06/17 1140  NA 136 138  K 3.7 3.9  CL 108 109  CO2 22 23  GLUCOSE 107* 112*  BUN 16 21*  CREATININE 0.69 0.93  CALCIUM 8.7* 8.7*   GFR: Estimated Creatinine Clearance: 103.5 mL/min (by C-G formula based on SCr of 0.93  mg/dL). Liver Function Tests: Recent Labs  Lab 05/05/17 0821 05/06/17 1140  AST 27 24  ALT 25 21  ALKPHOS 134* 149*  BILITOT 5.0* 3.4*  PROT 6.7 6.8  ALBUMIN 3.8 3.7   Recent Labs  Lab 05/05/17 0821  LIPASE 17   No results for input(s): AMMONIA in the last 168 hours. Coagulation Profile: Recent Labs  Lab 05/05/17 0821  INR 1.32   Cardiac Enzymes: No results for input(s): CKTOTAL, CKMB, CKMBINDEX, TROPONINI in the last 168 hours. BNP (last 3 results) No results for input(s): PROBNP in the last 8760 hours. HbA1C: No results for input(s): HGBA1C in the last 72 hours. CBG: No results for input(s): GLUCAP in the last 168 hours. Lipid Profile: No results for input(s): CHOL, HDL, LDLCALC, TRIG, CHOLHDL, LDLDIRECT in the last 72 hours. Thyroid Function Tests: No results for input(s): TSH, T4TOTAL, FREET4, T3FREE, THYROIDAB in the last 72 hours. Anemia Panel: No results for input(s): VITAMINB12, FOLATE, FERRITIN, TIBC, IRON, RETICCTPCT in the last 72 hours. Urine analysis:    Component Value Date/Time   COLORURINE ORANGE (A) 05/05/2017 0910   APPEARANCEUR CLOUDY (A) 05/05/2017 0910   LABSPEC 1.020 05/05/2017 0910   PHURINE 6.5 05/05/2017 0910   GLUCOSEU NEGATIVE 05/05/2017 0910   HGBUR SMALL (A) 05/05/2017 0910   BILIRUBINUR NEGATIVE 05/05/2017 0910   BILIRUBINUR small (A) 08/20/2016 1116   BILIRUBINUR neg 07/01/2013 0857   KETONESUR NEGATIVE 05/05/2017 0910   PROTEINUR 30 (A) 05/05/2017 0910   UROBILINOGEN 1.0  08/20/2016 1116   UROBILINOGEN 0.2 10/29/2012 1915   NITRITE NEGATIVE 05/05/2017 0910   LEUKOCYTESUR LARGE (A) 05/05/2017 0910   Sepsis Labs: !!!!!!!!!!!!!!!!!!!!!!!!!!!!!!!!!!!!!!!!!!!! @LABRCNTIP (procalcitonin:4,lacticidven:4) ) Recent Results (from the past 240 hour(s))  Blood culture (routine x 2)     Status: None (Preliminary result)   Collection Time: 05/05/17  8:20 AM  Result Value Ref Range Status   Specimen Description BLOOD RIGHT ARM  Final    Special Requests   Final    BOTTLES DRAWN AEROBIC AND ANAEROBIC Blood Culture adequate volume   Culture  Setup Time   Final    GRAM POSITIVE COCCI AEROBIC BOTTLE ONLY CRITICAL VALUE NOTED.  VALUE IS CONSISTENT WITH PREVIOUSLY REPORTED AND CALLED VALUE. Performed at [facility], [place] [address] [place], [place] [address]    Culture GRAM POSITIVE COCCI  Final   Report Status PENDING  Incomplete  Blood culture (routine x 2)     Status: None (Preliminary result)   Collection Time: 05/05/17  8:25 AM  Result Value Ref Range Status   Specimen Description BLOOD LEFT ARM  Final   Special Requests   Final    BOTTLES DRAWN AEROBIC AND ANAEROBIC Blood Culture adequate volume   Culture  Setup Time   Final    GRAM POSITIVE COCCI IN CLUSTERS IN BOTH AEROBIC AND ANAEROBIC BOTTLES CRITICAL RESULT CALLED TO, READ BACK BY AND VERIFIED WITH: MSIMMS,RN @0826  05/06/17 BY LHOWARD Performed at [facility], [place] [address] [place], [place] [address]    Culture GRAM POSITIVE COCCI  Final   Report Status PENDING  Incomplete  Blood Culture ID Panel (Reflexed)     Status: Abnormal   Collection Time: 05/05/17  8:25 AM  Result Value Ref Range Status   Enterococcus species NOT DETECTED NOT DETECTED Final   Listeria monocytogenes NOT DETECTED NOT DETECTED Final   Staphylococcus species DETECTED (A) NOT DETECTED Final    Comment: CRITICAL RESULT CALLED TO, READ BACK BY AND VERIFIED WITH: MSIMMS,RN @0826  05/06/17 BY LHOWARD    Staphylococcus aureus DETECTED (A) NOT DETECTED Final    Comment: Methicillin (oxacillin) susceptible Staphylococcus aureus (MSSA). Preferred therapy is anti staphylococcal beta lactam antibiotic (Cefazolin or Nafcillin), unless clinically contraindicated. CRITICAL RESULT CALLED TO, READ BACK BY AND VERIFIED WITH: MSIMMS,RN @0826  05/06/17 BY LHOWARD    Methicillin resistance NOT DETECTED NOT DETECTED Final   Streptococcus species NOT DETECTED NOT DETECTED Final    Streptococcus agalactiae NOT DETECTED NOT DETECTED Final   Streptococcus pneumoniae NOT DETECTED NOT DETECTED Final   Streptococcus pyogenes NOT DETECTED NOT DETECTED Final   Acinetobacter baumannii NOT DETECTED NOT DETECTED Final   Enterobacteriaceae species NOT DETECTED NOT DETECTED Final   Enterobacter cloacae complex NOT DETECTED NOT DETECTED Final   Escherichia coli NOT DETECTED NOT DETECTED Final   Klebsiella oxytoca NOT DETECTED NOT DETECTED Final   Klebsiella pneumoniae NOT DETECTED NOT DETECTED Final   Proteus species NOT DETECTED NOT DETECTED Final   Serratia marcescens NOT DETECTED NOT DETECTED Final   Haemophilus influenzae NOT DETECTED NOT DETECTED Final   Neisseria meningitidis NOT DETECTED NOT DETECTED Final   Pseudomonas aeruginosa NOT DETECTED NOT DETECTED Final   Candida albicans NOT DETECTED NOT DETECTED Final   Candida glabrata NOT DETECTED NOT DETECTED Final   Candida krusei NOT DETECTED NOT DETECTED Final   Candida parapsilosis NOT DETECTED NOT DETECTED Final   Candida tropicalis NOT DETECTED NOT DETECTED Final    Comment: Performed at [facility], [place]  [address] [place], [place] [address]  Urine culture     Status: Abnormal (Preliminary result)   Collection Time: 05/05/17  9:15 AM  Result Value Ref Range Status   Specimen Description URINE, CLEAN CATCH  Final   Special Requests NONE  Final   Culture (A)  Final    >=100,000 COLONIES/mL STAPHYLOCOCCUS AUREUS SUSCEPTIBILITIES TO FOLLOW Performed at [facility], [place] [address], [place], [place] [address]    Report Status PENDING  Incomplete     Radiological Exams on Admission: Dg Chest 2 View  Result Date: 05/05/2017 CLINICAL DATA:  A fever for the past 3 days. History of Crohn's disease, hypertension, diabetes, nonsmoker. EXAM: CHEST  2 VIEW COMPARISON:  Chest x-ray of May 14, 2016 FINDINGS: The lungs are adequately inflated. The interstitial markings are coarse though stable. The cardiac  silhouette is top-normal in size. The pulmonary vascularity is normal. The mediastinum is normal in width. There is no pleural effusion. The bony thorax exhibits no acute abnormality. IMPRESSION: Chronic interstitial prominence bilaterally. No alveolar pneumonia, CHF, nor other acute cardiopulmonary abnormality. Electronically Signed   By: David  Martinique M.D.   On: 05/05/2017 08:41   Ct Abdomen Pelvis W Contrast  Result Date: 05/05/2017 CLINICAL DATA:  History of Crohn's disease.  Constipation and pain EXAM: CT ABDOMEN AND PELVIS WITH CONTRAST TECHNIQUE: Multidetector CT imaging of the abdomen and pelvis was performed using the standard protocol following bolus administration of intravenous contrast. Oral contrast was also administered. CONTRAST:  133m ISOVUE-300 IOPAMIDOL (ISOVUE-300) INJECTION 61% COMPARISON:  July 22, 2016 FINDINGS: Lower chest: There is bibasilar lung atelectasis, primarily on the right posteriorly. Hepatobiliary: No focal liver lesions are evident. Gallbladder wall is not appreciably thickened. There is no biliary duct dilatation. Pancreas: No pancreatic mass or inflammatory focus. Spleen: Spleen is enlarged, measuring 18.1 x 17.1 x 7.4 cm with a measured splenic volume of 1,145 cubic cm. No focal splenic lesions are evident. Adrenals/Urinary Tract: Adrenals appear normal bilaterally. There is a cyst arising from the posterior upper pole left kidney measuring 2.5 x 2.9 cm. There is a second cyst laterally on the left measuring 2.8 x 2.8 cm. On the right inferiorly and medially, there is an area of decreased attenuation measuring 2.3 x 2.3 cm which raises concern for a noncystic mass. There is no hydronephrosis on either side. There is a calculus in the left mid kidney measuring 1.3 x 0.7 cm. A second calculus on the left measures 1.1 x 0.7 cm. There is a 3 mm calculus in this area as well. There are no ureteral calculi on either side. Urinary bladder is midline with wall thickness  within normal limits. Stomach/Bowel: There are scattered colonic diverticula without diverticulitis. There is no appreciable bowel wall or mesenteric thickening. There is moderate fluid throughout the colon without dilatation. No mesenteric thickening evident. No fistula noted. No bowel obstruction. No free air or portal venous air evident. Vascular/Lymphatic: There are foci of atherosclerotic calcification in the aorta and common iliac arteries. No aneurysm evident. Major mesenteric vessels appear patent. There is no adenopathy appreciable in the abdomen or pelvis. Reproductive: Prostate and seminal vesicles appear normal in size and contour. No pelvic mass evident. Other: There is no periappendiceal inflammation. There is scarring in the umbilical region anteriorly. There is a fluid collection in this area with attenuation value higher than is expected with serous fluid. This collection is smaller than on prior study, currently measuring 4.9 x 4.7 x 2.0 cm. There is no  air in this collection. There is no abscess or ascites evident in the abdomen or pelvis. There is fat in each inguinal ring. Musculoskeletal: No blastic or lytic bone lesions. There is degenerative change in the lower thoracic and lumbar spine. No intramuscular lesions are evident. IMPRESSION: 1. Within the right kidney, there is an area of decreased attenuation measuring 2.3 x 2.3 cm in the lower pole region inferiorly that cannot be classified as a cyst. Further assessment advised nonemergently. Further evaluation with pre and post contrast MRI or CT should be considered. MRI is preferred in younger patients (due to lack of ionizing radiation) and for evaluating calcified lesion(s). 2. Several nonobstructing calculi in left kidney. No hydronephrosis or ureteral calculus on either side. 3.  Splenomegaly of uncertain etiology. 4. Sigmoid diverticula without diverticulitis. No bowel obstruction. No bowel wall thickening. No fistula. 5. Scarring at  the level of a previously repaired umbilical hernia. Fluid in this area likely represents partially resolving and liquefying chronic hematoma. 6.  Mild aortic atherosclerosis. Aortic Atherosclerosis (ICD10-I70.0). Electronically Signed   By: Lowella Grip III M.D.   On: 05/05/2017 10:50     Assessment/Plan Principal Problem:   Bacteremia due to Staphylococcus aureus Active Problems:   Essential hypertension   CROHN'S DISEASE, LARGE AND SMALL INTESTINES   DM2 (diabetes mellitus, type 2) (HCC)   OSA on CPAP   1. Staph aureus UTI and bacteremia 1. Seen recently for abd pain and treated for constipation 2. Patient found to have staph UTI and later bacteremia 3. EDP discussed with pharmacist. Now on ancef 4. Will consult ID for further recs 5. Admit to med-surg 2. HTN 1. BP stable 2. Cont monitor 3. Cont home regimen 3. Crohn's disease 1. Currently stable 2. Recent constipation noted. Will cont on miralax as needed 4. DM2 1. Will continue on SSI coverage 5. OSA on CPAP 1. Will cont on CPAP at night 2. Stable  DVT prophylaxis: Lovenox subQ  Code Status: Full Family Communication: Pt in room  Disposition Plan: Uncertain at this time  Consults called: ID Admission status: Inpatient as would likely require greater than 2 midnight stay to work up bacteremia   Enez Monahan, Orpah Melter MD [facility] Pager [phone]  If 7PM-7AM, please contact night-coverage [email or web address] Password [email or web address]  05/06/2017, 1:15 PM

## 2017-05-06 NOTE — ED Triage Notes (Signed)
Patient was seen at [facility] yesterday and was called saying that urine culture showed staph infection and patient needed to get to ED.

## 2017-05-07 ENCOUNTER — Inpatient Hospital Stay (HOSPITAL_COMMUNITY): Payer: 59

## 2017-05-07 ENCOUNTER — Encounter: Payer: Self-pay | Admitting: Family Medicine

## 2017-05-07 DIAGNOSIS — R7881 Bacteremia: Secondary | ICD-10-CM

## 2017-05-07 DIAGNOSIS — K5080 Crohn's disease of both small and large intestine without complications: Secondary | ICD-10-CM

## 2017-05-07 DIAGNOSIS — I5030 Unspecified diastolic (congestive) heart failure: Secondary | ICD-10-CM

## 2017-05-07 DIAGNOSIS — G4733 Obstructive sleep apnea (adult) (pediatric): Secondary | ICD-10-CM

## 2017-05-07 DIAGNOSIS — K50819 Crohn's disease of both small and large intestine with unspecified complications: Secondary | ICD-10-CM

## 2017-05-07 DIAGNOSIS — E119 Type 2 diabetes mellitus without complications: Secondary | ICD-10-CM

## 2017-05-07 DIAGNOSIS — Z9989 Dependence on other enabling machines and devices: Secondary | ICD-10-CM

## 2017-05-07 DIAGNOSIS — I10 Essential (primary) hypertension: Secondary | ICD-10-CM

## 2017-05-07 LAB — CBC
HEMATOCRIT: 30 % — AB (ref 39.0–52.0)
HEMOGLOBIN: 10.6 g/dL — AB (ref 13.0–17.0)
MCH: 37.5 pg — ABNORMAL HIGH (ref 26.0–34.0)
MCHC: 35.3 g/dL (ref 30.0–36.0)
MCV: 106 fL — AB (ref 78.0–100.0)
Platelets: 108 10*3/uL — ABNORMAL LOW (ref 150–400)
RBC: 2.83 MIL/uL — ABNORMAL LOW (ref 4.22–5.81)
RDW: 15.4 % (ref 11.5–15.5)
WBC: 4.7 10*3/uL (ref 4.0–10.5)

## 2017-05-07 LAB — GLUCOSE, CAPILLARY
GLUCOSE-CAPILLARY: 104 mg/dL — AB (ref 65–99)
GLUCOSE-CAPILLARY: 78 mg/dL (ref 65–99)
GLUCOSE-CAPILLARY: 79 mg/dL (ref 65–99)
Glucose-Capillary: 80 mg/dL (ref 65–99)

## 2017-05-07 LAB — ECHOCARDIOGRAM COMPLETE
Height: 71 in
Weight: 3200 oz

## 2017-05-07 LAB — COMPREHENSIVE METABOLIC PANEL
ALT: 18 U/L (ref 17–63)
AST: 22 U/L (ref 15–41)
Albumin: 3.4 g/dL — ABNORMAL LOW (ref 3.5–5.0)
Alkaline Phosphatase: 130 U/L — ABNORMAL HIGH (ref 38–126)
Anion gap: 7 (ref 5–15)
BUN: 13 mg/dL (ref 6–20)
CO2: 23 mmol/L (ref 22–32)
Calcium: 8.3 mg/dL — ABNORMAL LOW (ref 8.9–10.3)
Chloride: 108 mmol/L (ref 101–111)
Creatinine, Ser: 0.8 mg/dL (ref 0.61–1.24)
GFR calc Af Amer: >60 mL/min (ref >60)
GFR calc non Af Amer: >60 mL/min (ref >60)
Glucose, Bld: 91 mg/dL (ref 65–99)
Potassium: 3.5 mmol/L (ref 3.5–5.1)
Sodium: 138 mmol/L (ref 135–145)
Total Bilirubin: 2.9 mg/dL — ABNORMAL HIGH (ref 0.3–1.2)
Total Protein: 6.4 g/dL — ABNORMAL LOW (ref 6.5–8.1)

## 2017-05-07 LAB — URINE CULTURE: Culture: >=100000 — AB

## 2017-05-07 MED ORDER — MERCAPTOPURINE 50 MG PO TABS: 100.0000 mg | ORAL_TABLET | Freq: Every day | ORAL | Status: DC

## 2017-05-07 MED ORDER — FLUOROURACIL 5 % EX CREA
1.0000 "application " | TOPICAL_CREAM | Freq: Every day | CUTANEOUS | Status: DC
  Administered 2017-05-07 – 2017-05-10 (×4): 1 via TOPICAL

## 2017-05-07 MED ORDER — TRAZODONE HCL 50 MG PO TABS
50.0000 mg | ORAL_TABLET | Freq: Every day | ORAL | Status: DC
  Administered 2017-05-07 – 2017-05-09 (×3): 100 mg via ORAL
  Administered 2017-05-10: 50 mg via ORAL
  Filled 2017-05-07: qty 1
  Filled 2017-05-07 (×3): qty 2

## 2017-05-07 MED ORDER — ZOLPIDEM TARTRATE 5 MG PO TABS: 5.0000 mg | ORAL_TABLET | Freq: Every evening | ORAL | Status: DC | PRN

## 2017-05-07 MED ORDER — FOLIC ACID 1 MG PO TABS
2.0000 mg | ORAL_TABLET | Freq: Every day | ORAL | Status: DC
  Administered 2017-05-07 – 2017-05-11 (×4): 2 mg via ORAL
  Filled 2017-05-07 (×4): qty 2

## 2017-05-07 NOTE — Care Management Note (Signed)
Case Management Note  Patient Details  Name: [name] [name] MRN: [id number] Date of Birth: 02-23-1961  Subjective/Objective:                  uti  Action/Plan: Date: May 07, 2017 Velva Harman, BSN, [place], [place] Chart and notes review for patient progress and needs. Will follow for case management and discharge needs. Next review date: [id number] Expected Discharge Date:  (unknown)               Expected Discharge Plan:  Home/Self Care  In-House Referral:     Discharge planning Services  CM Consult  Post Acute Care Choice:    Choice offered to:     DME Arranged:    DME Agency:     HH Arranged:    HH Agency:     Status of Service:  In process, will continue to follow  If discussed at Long Length of Stay Meetings, dates discussed:    Additional Comments:  Leeroy Cha, RN 05/07/2017, 8:30 AM

## 2017-05-07 NOTE — Progress Notes (Signed)
  Echocardiogram 2D Echocardiogram has been performed.  [place], [place] 05/07/2017, 1:36 PM

## 2017-05-07 NOTE — Progress Notes (Signed)
Subjective: No new complaints   Antibiotics:  Anti-infectives (From admission, onward)   Start     Dose/Rate Route Frequency Ordered Stop   05/06/17 2000  ceFAZolin (ANCEF) IVPB 2g/100 mL premix     2 g 200 mL/hr over 30 Minutes Intravenous Every 8 hours 05/06/17 1155     05/06/17 1230  ceFAZolin (ANCEF) IVPB 2g/100 mL premix     2 g 200 mL/hr over 30 Minutes Intravenous NOW 05/06/17 1155 05/06/17 1426      Medications: Scheduled Meds: . aspirin EC  81 mg Oral QHS  . diltiazem  180 mg Oral QHS  . enoxaparin (LOVENOX) injection  40 mg Subcutaneous Q24H  . ferrous sulfate  325 mg Oral QHS  . fluorouracil  1 application Topical QHS  . folic acid  2 mg Oral Daily  . insulin aspart  0-15 Units Subcutaneous TID WC  . insulin aspart  0-5 Units Subcutaneous QHS  . lamoTRIgine  100 mg Oral BID  . mesalamine  1.2 g Oral BID  . methscopolamine  5 mg Oral BID  . pantoprazole  40 mg Oral Daily  . sertraline  200 mg Oral Q breakfast  . traZODone  50-100 mg Oral QHS   Continuous Infusions: .  ceFAZolin (ANCEF) IV Stopped (05/07/17 1401)   PRN Meds:.acetaminophen **OR** acetaminophen, ketorolac, polyethylene glycol, zolpidem    Objective: Weight change:   Intake/Output Summary (Last 24 hours) at 05/07/2017 1715 Last data filed at 05/07/2017 1400 Gross per 24 hour  Intake 530 ml  Output 1150 ml  Net -620 ml   Blood pressure 118/68, pulse 71, temperature 98 F (36.7 C), temperature source Oral, resp. rate 18, height 5' 11"  (1.803 m), weight 200 lb (90.7 kg), SpO2 98 %. Temp:  [98 F (36.7 C)-98.8 F (37.1 C)] 98 F (36.7 C) (11/14 1440) Pulse Rate:  [71-80] 71 (11/14 1440) Resp:  [18] 18 (11/14 1440) BP: (118-129)/(64-70) 118/68 (11/14 1440) SpO2:  [96 %-98 %] 98 % (11/14 1440)  Physical Exam: General: Alert and awake, oriented x3, not in any acute distress. HEENT: anicteric sclera,, EOMI He is standing in bath room after having showered in underwear  discussing case with me Extremities /skin WITHOUT topical agent on them  05/07/17:          Neuro: nonfocal  CBC: @LABBLAST3 (wbc3,Hgb:3,Hct:3,Plt:3,INR:3APTT:3)@   BMET Recent Labs    05/06/17 1140 05/07/17 0635  NA 138 138  K 3.9 3.5  CL 109 108  CO2 23 23  GLUCOSE 112* 91  BUN 21* 13  CREATININE 0.93 0.80  CALCIUM 8.7* 8.3*     Liver Panel  Recent Labs    05/06/17 1140 05/07/17 0635  PROT 6.8 6.4*  ALBUMIN 3.7 3.4*  AST 24 22  ALT 21 18  ALKPHOS 149* 130*  BILITOT 3.4* 2.9*       Sedimentation Rate No results for input(s): ESRSEDRATE in the last 72 hours. C-Reactive Protein No results for input(s): CRP in the last 72 hours.  Micro Results: Recent Results (from the past 720 hour(s))  Blood culture (routine x 2)     Status: Abnormal (Preliminary result)   Collection Time: 05/05/17  8:20 AM  Result Value Ref Range Status   Specimen Description BLOOD RIGHT ARM  Final   Special Requests   Final    BOTTLES DRAWN AEROBIC AND ANAEROBIC Blood Culture adequate volume   Culture  Setup Time   Final    GRAM POSITIVE  COCCI AEROBIC BOTTLE ONLY CRITICAL VALUE NOTED.  VALUE IS CONSISTENT WITH PREVIOUSLY REPORTED AND CALLED VALUE.    Culture (A)  Final    STAPHYLOCOCCUS AUREUS SUSCEPTIBILITIES TO FOLLOW Performed at [facility], [place] [address], [place], [place] [address]    Report Status PENDING  Incomplete  Blood culture (routine x 2)     Status: Abnormal (Preliminary result)   Collection Time: 05/05/17  8:25 AM  Result Value Ref Range Status   Specimen Description BLOOD LEFT ARM  Final   Special Requests   Final    BOTTLES DRAWN AEROBIC AND ANAEROBIC Blood Culture adequate volume   Culture  Setup Time   Final    GRAM POSITIVE COCCI IN CLUSTERS IN BOTH AEROBIC AND ANAEROBIC BOTTLES CRITICAL RESULT CALLED TO, READ BACK BY AND VERIFIED WITH: MSIMMS,RN @0826  05/06/17 BY LHOWARD    Culture (A)  Final    STAPHYLOCOCCUS  AUREUS SUSCEPTIBILITIES TO FOLLOW Performed at [facility], [place] [address] [place], [place] [address]    Report Status PENDING  Incomplete  Blood Culture ID Panel (Reflexed)     Status: Abnormal   Collection Time: 05/05/17  8:25 AM  Result Value Ref Range Status   Enterococcus species NOT DETECTED NOT DETECTED Final   Listeria monocytogenes NOT DETECTED NOT DETECTED Final   Staphylococcus species DETECTED (A) NOT DETECTED Final    Comment: CRITICAL RESULT CALLED TO, READ BACK BY AND VERIFIED WITH: MSIMMS,RN @0826  05/06/17 BY LHOWARD    Staphylococcus aureus DETECTED (A) NOT DETECTED Final    Comment: Methicillin (oxacillin) susceptible Staphylococcus aureus (MSSA). Preferred therapy is anti staphylococcal beta lactam antibiotic (Cefazolin or Nafcillin), unless clinically contraindicated. CRITICAL RESULT CALLED TO, READ BACK BY AND VERIFIED WITH: MSIMMS,RN @0826  05/06/17 BY LHOWARD    Methicillin resistance NOT DETECTED NOT DETECTED Final   Streptococcus species NOT DETECTED NOT DETECTED Final   Streptococcus agalactiae NOT DETECTED NOT DETECTED Final   Streptococcus pneumoniae NOT DETECTED NOT DETECTED Final   Streptococcus pyogenes NOT DETECTED NOT DETECTED Final   Acinetobacter baumannii NOT DETECTED NOT DETECTED Final   Enterobacteriaceae species NOT DETECTED NOT DETECTED Final   Enterobacter cloacae complex NOT DETECTED NOT DETECTED Final   Escherichia coli NOT DETECTED NOT DETECTED Final   Klebsiella oxytoca NOT DETECTED NOT DETECTED Final   Klebsiella pneumoniae NOT DETECTED NOT DETECTED Final   Proteus species NOT DETECTED NOT DETECTED Final   Serratia marcescens NOT DETECTED NOT DETECTED Final   Haemophilus influenzae NOT DETECTED NOT DETECTED Final   Neisseria meningitidis NOT DETECTED NOT DETECTED Final   Pseudomonas aeruginosa NOT DETECTED NOT DETECTED Final   Candida albicans NOT DETECTED NOT DETECTED Final   Candida glabrata NOT DETECTED NOT DETECTED Final    Candida krusei NOT DETECTED NOT DETECTED Final   Candida parapsilosis NOT DETECTED NOT DETECTED Final   Candida tropicalis NOT DETECTED NOT DETECTED Final    Comment: Performed at [facility], [place] [address] [place], [place] [address]  Urine culture     Status: Abnormal   Collection Time: 05/05/17  9:15 AM  Result Value Ref Range Status   Specimen Description URINE, CLEAN CATCH  Final   Special Requests NONE  Final   Culture >=100,000 COLONIES/mL STAPHYLOCOCCUS AUREUS (A)  Final   Report Status 05/07/2017 FINAL  Final   Organism ID, Bacteria STAPHYLOCOCCUS AUREUS (A)  Final      Susceptibility   Staphylococcus aureus - MIC*    CIPROFLOXACIN <=0.5 SENSITIVE Sensitive  GENTAMICIN <=0.5 SENSITIVE Sensitive     NITROFURANTOIN <=16 SENSITIVE Sensitive     OXACILLIN 0.5 SENSITIVE Sensitive     TETRACYCLINE <=1 SENSITIVE Sensitive     VANCOMYCIN 1 SENSITIVE Sensitive     TRIMETH/SULFA <=10 SENSITIVE Sensitive     CLINDAMYCIN <=0.25 SENSITIVE Sensitive     RIFAMPIN <=0.5 SENSITIVE Sensitive     Inducible Clindamycin NEGATIVE Sensitive     * >=100,000 COLONIES/mL STAPHYLOCOCCUS AUREUS  Culture, blood (routine x 2)     Status: None (Preliminary result)   Collection Time: 05/06/17 11:30 AM  Result Value Ref Range Status   Specimen Description BLOOD LEFT ANTECUBITAL  Final   Special Requests   Final    BOTTLES DRAWN AEROBIC AND ANAEROBIC Blood Culture adequate volume   Culture   Final    NO GROWTH < 24 HOURS Performed at [facility], [address] [place], [place] [address]    Report Status PENDING  Incomplete  Culture, blood (routine x 2)     Status: None (Preliminary result)   Collection Time: 05/06/17 11:45 AM  Result Value Ref Range Status   Specimen Description BLOOD RIGHT ANTECUBITAL  Final   Special Requests   Final    BOTTLES DRAWN AEROBIC AND ANAEROBIC Blood Culture adequate volume   Culture   Final    NO GROWTH < 24 HOURS Performed at [place], [place] [address] [place], [place] [address]    Report Status PENDING  Incomplete  MRSA PCR Screening     Status: None   Collection Time: 05/06/17  4:43 PM  Result Value Ref Range Status   MRSA by PCR NEGATIVE NEGATIVE Final    Comment:        The GeneXpert MRSA Assay (FDA approved for NASAL specimens only), is one component of a comprehensive MRSA colonization surveillance program. It is not intended to diagnose MRSA infection nor to guide or monitor treatment for MRSA infections.     Studies/Results: Ct Chest W Contrast  Result Date: 05/06/2017 CLINICAL DATA:  Rib pain.  Suspect rib fracture.  Bacteremia. EXAM: CT CHEST WITH CONTRAST TECHNIQUE: Multidetector CT imaging of the chest was performed during intravenous contrast administration. CONTRAST:  28m ISOVUE-300 IOPAMIDOL (ISOVUE-300) INJECTION 61% COMPARISON:  01/07/2013. FINDINGS: Cardiovascular: There is moderate cardiac enlargement. Aortic atherosclerosis. No pericardial effusion. Lad coronary artery calcifications noted. Mediastinum/Nodes: The trachea appears patent and is midline. Normal appearance of the esophagus. No adenopathy within the mediastinum or hilar regions. No axillary or supraclavicular adenopathy. Lungs/Pleura: Subsegmental subpleural atelectasis overlie posterior lower lobes noted. No pneumothorax or pulmonary contusion. Upper Abdomen: The spleen is incompletely visualized but appears enlarged measuring approximately 13 by 12.9 by 7.8 cm (volume = 680 cm^3). Hypertrophy of the lateral segment of left lobe of liver and caudate lobe of liver noted. Contour the liver is slightly irregular. No focal liver abnormality. Cyst arises from the upper pole of the left kidney. Musculoskeletal: Degenerative disc disease identified within the thoracic spine. There are acute fractures involving the lateral aspect of the right fifth, sixth and seventh ribs. IMPRESSION: 1. Acute right fifth, sixth and seventh rib fracture deformities  2. Morphologic features of liver consistent with cirrhosis. Splenomegaly. 3. Aortic Atherosclerosis (ICD10-I70.0). Lad coronary artery calcifications noted. Electronically Signed   By: TKerby MoorsM.D.   On: 05/06/2017 17:49      Assessment/Plan:  INTERVAL HISTORY:  Blood cultures from admission (ON ORAL LEVAQUIN to which his MSSA was S to )  NO GROWTH < 24 hours   Principal Problem:   Bacteremia due to Staphylococcus aureus Active Problems:   Essential hypertension   CROHN'S DISEASE, LARGE AND SMALL INTESTINES   DM2 (diabetes mellitus, type 2) (HCC)   OSA on CPAP   Staphylococcus aureus bacteremia    [name] [name] is a 56 y.o. male with  MSSA bacteremia without clear cut source, potentially from skin   #1.      [place] [facility] Staphylococcus aureus bacteremia   Staphylococcus aureus bacteremia (SAB) is associated with a high rate of complications and mortality.  Specific aspects of clinical management are critical to optimizing the outcome of patients with SAB.  Therefore, the [facility] Antimicrobial Management Team [facility]) has initiated an intervention aimed at improving the management of SAB at [facility].  To do so, Infectious Diseases physicians are providing an evidence-based consult for the management of all patients with SAB.     Yes No Comments  Perform follow-up blood cultures (even if the patient is afebrile) to ensure clearance of bacteremia [x]  []  Repeat blood cultures on levaquin done and today on ancef  Remove vascular catheter and obtain follow-up blood cultures after the removal of the catheter [x]  []  DO NOT PLACE PICC UNTIL WE HAVE MINIMUM OF 72 hours potentially 98 hrs with NO GROWTH  Perform echocardiography to evaluate for endocarditis (transthoracic ECHO is 40-50% sensitive, TEE is > 90% sensitive) []  []  Please keep in mind, that neither test can definitively EXCLUDE endocarditis, and that should clinical suspicion remain high for  endocarditis the patient should then still be treated with an "endocarditis" duration of therapy = 6 weeks  He needs a  TEE  Consult electrophysiologist to evaluate implanted cardiac device (pacemaker, ICD) []  []  NA  Ensure source control [x]  []  Have all abscesses been drained effectively? Have deep seeded infections (septic joints or osteomyelitis) had appropriate surgical debridement?  Source unclear  Investigate for "metastatic" sites of infection [x]  []  Does the patient have ANY symptom or physical exam finding that would suggest a deeper infection (back or neck pain that may be suggestive of vertebral osteomyelitis or epidural abscess, muscle pain that could be a symptom of pyomyositis)?  Keep in mind that for deep seeded infections MRI imaging with contrast is preferred rather than other often insensitive tests such as plain x-rays, especially early in a patient's presentation.  No other obvious sites of been identified  Change antibiotic therapy to cefazolin []  []  Beta-lactam antibiotics are preferred for MSSA due to higher cure rates.   If on Vancomycin, goal trough should be 15 - 20 mcg/mL  Estimated duration of IV antibiotic therapy: 4-6 weeks [x]  []  Consult case management for probably prolonged outpatient IV antibiotic therapy      LOS: 1 day   Alcide Evener 05/07/2017, 5:15 PM

## 2017-05-07 NOTE — Progress Notes (Addendum)
PHARMACIST - PHYSICIAN COMMUNICATION CONCERNING:  Mercaptopurine  Mercaptopurine will be place on hold due to [employer] approved oral chemotherapy policy which requires drugs classified as oral chemotherapy, including mercaptopurine, to be placed on hold for active infection until physician review occurs as described in the oral chemotherapy policy.  Patient admitted for treatment of bacteremia.  Additional hold criteria for mercaptopurine listed below.  Please note patient's platelets are also cut off for meeting hold criteria and total bilirubin is elevated.  Mercaptopurine (Purinethol) hold criteria  Hgb < 8   WBC < 3000   Pltc < 100K   AST or ALT > 3x ULN   Bili > 1.5x ULN   Hershal Coria, PharmD, BCPS Pager: [phone] 05/07/2017 1:37 PM

## 2017-05-07 NOTE — Telephone Encounter (Signed)
Pt admitted to hospital 05/06/2017 with Bacteremia. Rx request on hold.  See message from wife.

## 2017-05-07 NOTE — Progress Notes (Signed)
PROGRESS NOTE    [name] [name]  MIW:[id number] DOB: 1961-05-01 DOA: 05/06/2017 PCP: Wardell Honour, MD    Brief Narrative:  [name] [name] is a 56 y.o. male with medical history significant of Crohn's disease involving small and large intestines, seizure disorder, arthritis, diabetes, depression, hypertension, hyperlipidemia who was recently seen at [facility] for abdominal pains.  Patient underwent CT abdomen pelvis which had findings concerning for constipation.  Patient was given a bowel regimen with market improvement of his abdominal discomfort.  An incidental 2.3 x 2.3 cm lower pole lesion of decreased attenuation was noted.  Recommendations were for pre-and postcontrast MRI or CT.  During that visit, patient was found to have a urinalysis suggestive of a urinary tract infection with pan cultures obtained.  Patient was discharged from the emergency department with levofloxacin.  On the day of hospital admission, patient was informed that his blood culture was positive for staph aureus and to present to the emergency department.  ED Course: The emergency department, patient was found to have a white blood count of 6.2.  Patient was afebrile.  Repeat blood cultures were obtained, currently pending.  ED physician has started the patient on empiric Ancef after discussion with pharmacy.  Infectious disease was consulted.  Hospitalist service was consulted for consideration for admission     Assessment & Plan:   Principal Problem:   Bacteremia due to Staphylococcus aureus Active Problems:   Essential hypertension   CROHN'S DISEASE, LARGE AND SMALL INTESTINES   DM2 (diabetes mellitus, type 2) (HCC)   OSA on CPAP   Staphylococcus aureus bacteremia   Staph aureus UTI and bacteremia - Seen recently for abd pain and treated for constipation - Patient found to have staph UTI and later bacteremia -Continue Ancef -Infectious disease consulted for further recommendations -TTE  scheduled for today -Pending results of TTE may need TEE to further evaluate endocarditis risk Will need repeat blood cultures to ensure clearance  HTN - BP stable - Cont monitor - Cont home regimen  Crohn's disease - Currently stable - Recent constipation noted. Will cont on miralax as needed -Patient denies any symptoms at this time  Squamous cell carcinoma of the skin - Wife to bring in medication ointment for patient to apply twice daily - Patient using fluorouracil cream -Restart mercaptopurine and folic acid  DM2 - Will continue on SSI coverage  OSA on CPAP - Will cont on CPAP at night - Stable  DVT prophylaxis: Lovenox subQ  Code Status: Full Family Communication: Pt in room  Disposition Plan: Uncertain at this time; pending further evaluation of bacteremia     Consultants:   Infectious Disease  Procedures:   TTE  Antimicrobials:   Ancef    Subjective: Patient is sitting up in bed watching television and his wife is bedside.  He voices that he is in full understanding of what the plan is after discussing with infectious disease provider yesterday.  Wife asks questions about whether or not patient can get workup with MRI for suspicious lesion noted on CT scan.  Wife states she will bring in skin cream for patient's skin cancer later this afternoon.  Patient denies any chest pain or chest pressure.  He voices no new complaints.  Objective: Vitals:   05/06/17 1508 05/06/17 1535 05/06/17 2117 05/07/17 0529  BP: 107/64 110/68 129/70 118/64  Pulse: 66 68 80 72  Resp: 16 18 18 18   Temp:  98.8 F (37.1 C) 98.8 F (37.1 C) 98.1  F (36.7 C)  TempSrc:  Oral Oral Oral  SpO2: 96% 97% 98% 96%  Weight:      Height:        Intake/Output Summary (Last 24 hours) at 05/07/2017 0839 Last data filed at 05/07/2017 0730 Gross per 24 hour  Intake 150 ml  Output 1150 ml  Net -1000 ml   Filed Weights   05/06/17 1056  Weight: 90.7 kg (200 lb)     Examination:  General exam: Appears calm and comfortable  Respiratory system: Clear to auscultation. Respiratory effort normal. Cardiovascular system: S1 & S2 heard, RRR. No JVD, murmurs, rubs, gallops or clicks. No pedal edema. Gastrointestinal system: Abdomen is nondistended, soft and nontender. No organomegaly or masses felt. Normal bowel sounds heard. Central nervous system: Alert and oriented. No focal neurological deficits. Extremities: Symmetric 5 x 5 power. Skin: Lesions noted on arms bilaterally states these are signs of skin cancer see Dr. Arlyss Queen note on 05-07-2007 for pictures of skin cancer Psychiatry: Judgement and insight appear normal. Mood & affect appropriate.     Data Reviewed: I have personally reviewed following labs and imaging studies  CBC: Recent Labs  Lab 05/05/17 0821 05/06/17 1140 05/07/17 0635  WBC 8.7 6.2 4.7  NEUTROABS 7.2 4.9  --   HGB 11.5* 11.4* 10.6*  HCT 32.7* 32.0* 30.0*  MCV 108.3* 106.7* 106.0*  PLT 105* 107* 694*   Basic Metabolic Panel: Recent Labs  Lab 05/05/17 0821 05/06/17 1140 05/07/17 0635  NA 136 138 138  K 3.7 3.9 3.5  CL 108 109 108  CO2 22 23 23   GLUCOSE 107* 112* 91  BUN 16 21* 13  CREATININE 0.69 0.93 0.80  CALCIUM 8.7* 8.7* 8.3*   GFR: Estimated Creatinine Clearance: 120.3 mL/min (by C-G formula based on SCr of 0.8 mg/dL). Liver Function Tests: Recent Labs  Lab 05/05/17 0821 05/06/17 1140 05/07/17 0635  AST 27 24 22   ALT 25 21 18   ALKPHOS 134* 149* 130*  BILITOT 5.0* 3.4* 2.9*  PROT 6.7 6.8 6.4*  ALBUMIN 3.8 3.7 3.4*   Recent Labs  Lab 05/05/17 0821  LIPASE 17   No results for input(s): AMMONIA in the last 168 hours. Coagulation Profile: Recent Labs  Lab 05/05/17 0821  INR 1.32   Cardiac Enzymes: No results for input(s): CKTOTAL, CKMB, CKMBINDEX, TROPONINI in the last 168 hours. BNP (last 3 results) No results for input(s): PROBNP in the last 8760 hours. HbA1C: No results for  input(s): HGBA1C in the last 72 hours. CBG: Recent Labs  Lab 05/06/17 1706 05/06/17 2116 05/06/17 2220 05/07/17 0730  GLUCAP 82 98 83 80   Lipid Profile: No results for input(s): CHOL, HDL, LDLCALC, TRIG, CHOLHDL, LDLDIRECT in the last 72 hours. Thyroid Function Tests: No results for input(s): TSH, T4TOTAL, FREET4, T3FREE, THYROIDAB in the last 72 hours. Anemia Panel: No results for input(s): VITAMINB12, FOLATE, FERRITIN, TIBC, IRON, RETICCTPCT in the last 72 hours. Sepsis Labs: Recent Labs  Lab 05/05/17 8546  LATICACIDVEN 1.12    Recent Results (from the past 240 hour(s))  Blood culture (routine x 2)     Status: None (Preliminary result)   Collection Time: 05/05/17  8:20 AM  Result Value Ref Range Status   Specimen Description BLOOD RIGHT ARM  Final   Special Requests   Final    BOTTLES DRAWN AEROBIC AND ANAEROBIC Blood Culture adequate volume   Culture  Setup Time   Final    GRAM POSITIVE COCCI AEROBIC BOTTLE ONLY CRITICAL VALUE NOTED.  VALUE IS CONSISTENT WITH PREVIOUSLY REPORTED AND CALLED VALUE. Performed at [facility], [place] [address], [place], [place] [address]    Culture GRAM POSITIVE COCCI  Final   Report Status PENDING  Incomplete  Blood culture (routine x 2)     Status: None (Preliminary result)   Collection Time: 05/05/17  8:25 AM  Result Value Ref Range Status   Specimen Description BLOOD LEFT ARM  Final   Special Requests   Final    BOTTLES DRAWN AEROBIC AND ANAEROBIC Blood Culture adequate volume   Culture  Setup Time   Final    GRAM POSITIVE COCCI IN CLUSTERS IN BOTH AEROBIC AND ANAEROBIC BOTTLES CRITICAL RESULT CALLED TO, READ BACK BY AND VERIFIED WITH: MSIMMS,RN @0826  05/06/17 BY LHOWARD Performed at [facility], [place] [address] [place], [place] [address]    Culture GRAM POSITIVE COCCI  Final   Report Status PENDING  Incomplete  Blood Culture ID Panel (Reflexed)     Status: Abnormal   Collection Time: 05/05/17  8:25 AM  Result  Value Ref Range Status   Enterococcus species NOT DETECTED NOT DETECTED Final   Listeria monocytogenes NOT DETECTED NOT DETECTED Final   Staphylococcus species DETECTED (A) NOT DETECTED Final    Comment: CRITICAL RESULT CALLED TO, READ BACK BY AND VERIFIED WITH: MSIMMS,RN @0826  05/06/17 BY LHOWARD    Staphylococcus aureus DETECTED (A) NOT DETECTED Final    Comment: Methicillin (oxacillin) susceptible Staphylococcus aureus (MSSA). Preferred therapy is anti staphylococcal beta lactam antibiotic (Cefazolin or Nafcillin), unless clinically contraindicated. CRITICAL RESULT CALLED TO, READ BACK BY AND VERIFIED WITH: MSIMMS,RN @0826  05/06/17 BY LHOWARD    Methicillin resistance NOT DETECTED NOT DETECTED Final   Streptococcus species NOT DETECTED NOT DETECTED Final   Streptococcus agalactiae NOT DETECTED NOT DETECTED Final   Streptococcus pneumoniae NOT DETECTED NOT DETECTED Final   Streptococcus pyogenes NOT DETECTED NOT DETECTED Final   Acinetobacter baumannii NOT DETECTED NOT DETECTED Final   Enterobacteriaceae species NOT DETECTED NOT DETECTED Final   Enterobacter cloacae complex NOT DETECTED NOT DETECTED Final   Escherichia coli NOT DETECTED NOT DETECTED Final   Klebsiella oxytoca NOT DETECTED NOT DETECTED Final   Klebsiella pneumoniae NOT DETECTED NOT DETECTED Final   Proteus species NOT DETECTED NOT DETECTED Final   Serratia marcescens NOT DETECTED NOT DETECTED Final   Haemophilus influenzae NOT DETECTED NOT DETECTED Final   Neisseria meningitidis NOT DETECTED NOT DETECTED Final   Pseudomonas aeruginosa NOT DETECTED NOT DETECTED Final   Candida albicans NOT DETECTED NOT DETECTED Final   Candida glabrata NOT DETECTED NOT DETECTED Final   Candida krusei NOT DETECTED NOT DETECTED Final   Candida parapsilosis NOT DETECTED NOT DETECTED Final   Candida tropicalis NOT DETECTED NOT DETECTED Final    Comment: Performed at [facility], [place] [address] [place], [place] [address]  Urine  culture     Status: Abnormal   Collection Time: 05/05/17  9:15 AM  Result Value Ref Range Status   Specimen Description URINE, CLEAN CATCH  Final   Special Requests NONE  Final   Culture >=100,000 COLONIES/mL STAPHYLOCOCCUS AUREUS (A)  Final   Report Status 05/07/2017 FINAL  Final   Organism ID, Bacteria STAPHYLOCOCCUS AUREUS (A)  Final      Susceptibility   Staphylococcus aureus - MIC*    CIPROFLOXACIN <=0.5 SENSITIVE Sensitive     GENTAMICIN <=0.5 SENSITIVE Sensitive     NITROFURANTOIN <=16 SENSITIVE Sensitive     OXACILLIN 0.5  SENSITIVE Sensitive     TETRACYCLINE <=1 SENSITIVE Sensitive     VANCOMYCIN 1 SENSITIVE Sensitive     TRIMETH/SULFA <=10 SENSITIVE Sensitive     CLINDAMYCIN <=0.25 SENSITIVE Sensitive     RIFAMPIN <=0.5 SENSITIVE Sensitive     Inducible Clindamycin NEGATIVE Sensitive     * >=100,000 COLONIES/mL STAPHYLOCOCCUS AUREUS  MRSA PCR Screening     Status: None   Collection Time: 05/06/17  4:43 PM  Result Value Ref Range Status   MRSA by PCR NEGATIVE NEGATIVE Final    Comment:        The GeneXpert MRSA Assay (FDA approved for NASAL specimens only), is one component of a comprehensive MRSA colonization surveillance program. It is not intended to diagnose MRSA infection nor to guide or monitor treatment for MRSA infections.          Radiology Studies: Ct Chest W Contrast  Result Date: 05/06/2017 CLINICAL DATA:  Rib pain.  Suspect rib fracture.  Bacteremia. EXAM: CT CHEST WITH CONTRAST TECHNIQUE: Multidetector CT imaging of the chest was performed during intravenous contrast administration. CONTRAST:  52m ISOVUE-300 IOPAMIDOL (ISOVUE-300) INJECTION 61% COMPARISON:  01/07/2013. FINDINGS: Cardiovascular: There is moderate cardiac enlargement. Aortic atherosclerosis. No pericardial effusion. Lad coronary artery calcifications noted. Mediastinum/Nodes: The trachea appears patent and is midline. Normal appearance of the esophagus. No adenopathy within the  mediastinum or hilar regions. No axillary or supraclavicular adenopathy. Lungs/Pleura: Subsegmental subpleural atelectasis overlie posterior lower lobes noted. No pneumothorax or pulmonary contusion. Upper Abdomen: The spleen is incompletely visualized but appears enlarged measuring approximately 13 by 12.9 by 7.8 cm (volume = 680 cm^3). Hypertrophy of the lateral segment of left lobe of liver and caudate lobe of liver noted. Contour the liver is slightly irregular. No focal liver abnormality. Cyst arises from the upper pole of the left kidney. Musculoskeletal: Degenerative disc disease identified within the thoracic spine. There are acute fractures involving the lateral aspect of the right fifth, sixth and seventh ribs. IMPRESSION: 1. Acute right fifth, sixth and seventh rib fracture deformities 2. Morphologic features of liver consistent with cirrhosis. Splenomegaly. 3. Aortic Atherosclerosis (ICD10-I70.0). Lad coronary artery calcifications noted. Electronically Signed   By: TKerby MoorsM.D.   On: 05/06/2017 17:49   Ct Abdomen Pelvis W Contrast  Result Date: 05/05/2017 CLINICAL DATA:  History of Crohn's disease.  Constipation and pain EXAM: CT ABDOMEN AND PELVIS WITH CONTRAST TECHNIQUE: Multidetector CT imaging of the abdomen and pelvis was performed using the standard protocol following bolus administration of intravenous contrast. Oral contrast was also administered. CONTRAST:  103mISOVUE-300 IOPAMIDOL (ISOVUE-300) INJECTION 61% COMPARISON:  July 22, 2016 FINDINGS: Lower chest: There is bibasilar lung atelectasis, primarily on the right posteriorly. Hepatobiliary: No focal liver lesions are evident. Gallbladder wall is not appreciably thickened. There is no biliary duct dilatation. Pancreas: No pancreatic mass or inflammatory focus. Spleen: Spleen is enlarged, measuring 18.1 x 17.1 x 7.4 cm with a measured splenic volume of 1,145 cubic cm. No focal splenic lesions are evident. Adrenals/Urinary  Tract: Adrenals appear normal bilaterally. There is a cyst arising from the posterior upper pole left kidney measuring 2.5 x 2.9 cm. There is a second cyst laterally on the left measuring 2.8 x 2.8 cm. On the right inferiorly and medially, there is an area of decreased attenuation measuring 2.3 x 2.3 cm which raises concern for a noncystic mass. There is no hydronephrosis on either side. There is a calculus in the left mid kidney measuring 1.3 x 0.7 cm. A  second calculus on the left measures 1.1 x 0.7 cm. There is a 3 mm calculus in this area as well. There are no ureteral calculi on either side. Urinary bladder is midline with wall thickness within normal limits. Stomach/Bowel: There are scattered colonic diverticula without diverticulitis. There is no appreciable bowel wall or mesenteric thickening. There is moderate fluid throughout the colon without dilatation. No mesenteric thickening evident. No fistula noted. No bowel obstruction. No free air or portal venous air evident. Vascular/Lymphatic: There are foci of atherosclerotic calcification in the aorta and common iliac arteries. No aneurysm evident. Major mesenteric vessels appear patent. There is no adenopathy appreciable in the abdomen or pelvis. Reproductive: Prostate and seminal vesicles appear normal in size and contour. No pelvic mass evident. Other: There is no periappendiceal inflammation. There is scarring in the umbilical region anteriorly. There is a fluid collection in this area with attenuation value higher than is expected with serous fluid. This collection is smaller than on prior study, currently measuring 4.9 x 4.7 x 2.0 cm. There is no air in this collection. There is no abscess or ascites evident in the abdomen or pelvis. There is fat in each inguinal ring. Musculoskeletal: No blastic or lytic bone lesions. There is degenerative change in the lower thoracic and lumbar spine. No intramuscular lesions are evident. IMPRESSION: 1. Within the  right kidney, there is an area of decreased attenuation measuring 2.3 x 2.3 cm in the lower pole region inferiorly that cannot be classified as a cyst. Further assessment advised nonemergently. Further evaluation with pre and post contrast MRI or CT should be considered. MRI is preferred in younger patients (due to lack of ionizing radiation) and for evaluating calcified lesion(s). 2. Several nonobstructing calculi in left kidney. No hydronephrosis or ureteral calculus on either side. 3.  Splenomegaly of uncertain etiology. 4. Sigmoid diverticula without diverticulitis. No bowel obstruction. No bowel wall thickening. No fistula. 5. Scarring at the level of a previously repaired umbilical hernia. Fluid in this area likely represents partially resolving and liquefying chronic hematoma. 6.  Mild aortic atherosclerosis. Aortic Atherosclerosis (ICD10-I70.0). Electronically Signed   By: Lowella Grip III M.D.   On: 05/05/2017 10:50        Scheduled Meds: . aspirin EC  81 mg Oral QHS  . diltiazem  180 mg Oral QHS  . enoxaparin (LOVENOX) injection  40 mg Subcutaneous Q24H  . ferrous sulfate  325 mg Oral QHS  . insulin aspart  0-15 Units Subcutaneous TID WC  . insulin aspart  0-5 Units Subcutaneous QHS  . lamoTRIgine  100 mg Oral BID  . mesalamine  1.2 g Oral BID  . methscopolamine  5 mg Oral BID  . pantoprazole  40 mg Oral Daily  . sertraline  200 mg Oral Q breakfast   Continuous Infusions: .  ceFAZolin (ANCEF) IV 2 g (05/07/17 0529)     LOS: 1 day    Time spent: 35 minutes    Loretha Stapler, MD [facility] Pager [phone]  If 7PM-7AM, please contact night-coverage [email or web address] Password [facility] 05/07/2017, 8:39 AM

## 2017-05-08 ENCOUNTER — Telehealth (HOSPITAL_BASED_OUTPATIENT_CLINIC_OR_DEPARTMENT_OTHER): Payer: Self-pay

## 2017-05-08 LAB — CULTURE, BLOOD (ROUTINE X 2)
SPECIAL REQUESTS: ADEQUATE
Special Requests: ADEQUATE

## 2017-05-08 LAB — GLUCOSE, CAPILLARY
GLUCOSE-CAPILLARY: 86 mg/dL (ref 65–99)
GLUCOSE-CAPILLARY: 138 mg/dL — AB (ref 65–99)
Glucose-Capillary: 129 mg/dL — ABNORMAL HIGH (ref 65–99)
Glucose-Capillary: 149 mg/dL — ABNORMAL HIGH (ref 65–99)

## 2017-05-08 NOTE — Progress Notes (Signed)
PROGRESS NOTE    [name] [name]  IOM:[id number] DOB: 09-07-1960 DOA: 05/06/2017 PCP: Wardell Honour, MD    Brief Narrative:  [name] [name] is a 56 y.o. male with medical history significant of Crohn's disease involving small and large intestines, seizure disorder, arthritis, diabetes, depression, hypertension, hyperlipidemia who was recently seen at [facility] for abdominal pains.  Patient underwent CT abdomen pelvis which had findings concerning for constipation.  Patient was given a bowel regimen with market improvement of his abdominal discomfort.  An incidental 2.3 x 2.3 cm lower pole lesion of decreased attenuation was noted.  Recommendations were for pre-and postcontrast MRI or CT.  During that visit, patient was found to have a urinalysis suggestive of a urinary tract infection with pan cultures obtained.  Patient was discharged from the emergency department with levofloxacin.  On the day of hospital admission, patient was informed that his blood culture was positive for staph aureus and to present to the emergency department.  ED Course: The emergency department, patient was found to have a white blood count of 6.2.  Patient was afebrile.  Repeat blood cultures were obtained, currently pending.  ED physician has started the patient on empiric Ancef after discussion with pharmacy.  Infectious disease was consulted.  Hospitalist service was consulted for consideration for admission  Patient was found to have MSSA bacteremia and infectious disease was consulted. Patient underwent TTE on 05/07/2017.     Assessment & Plan:   Principal Problem:   Bacteremia due to Staphylococcus aureus Active Problems:   Essential hypertension   CROHN'S DISEASE, LARGE AND SMALL INTESTINES   DM2 (diabetes mellitus, type 2) (HCC)   OSA on CPAP   Staphylococcus aureus bacteremia   Staph aureus UTI and bacteremia - Seen recently for abd pain and treated for constipation - Patient found to have  staph UTI and later bacteremia -Continue Ancef -Infectious disease consulted for further recommendations -TTE performed yesterday -Infectious disease to schedule TEE with cardiology at earliest convenience -Repeat blood cultures drawn yesterday -Will need PICC line placement when blood cultures negative for 48 hours  HTN - BP stable - Cont monitor - Cont home regimen  Crohn's disease - Currently stable -Patient reports he has not had a bowel movement since time of admission -Will encourage patient to take MiraLAX given history of Crohn's disease  Squamous cell carcinoma of the skin -Continue fluorouracil cream -Restart mercaptopurine and folic acid  DM2 - Will continue on SSI coverage  OSA on CPAP - Will cont on CPAP at night - Stable  DVT prophylaxis: Lovenox subQ  Code Status: Full Family Communication: Pt in room  Disposition Plan: Uncertain at this time; pending further evaluation of bacteremia     Consultants:   Infectious Disease  Procedures:   TTE  Antimicrobials:   Ancef    Subjective: Patient seen on bedside rounds after just eating breakfast.  He reports he has had a good appetite this morning.  Reports he is going to try to get up and walk up and down the hallways today.  Underwent TTE yesterday.  States he discussed this with infectious disease and he will need a TEE scheduled at her earliest convenience.  Denies any fevers, chills or chest pain.  Objective: Vitals:   05/07/17 1440 05/07/17 2113 05/08/17 0518 05/08/17 0753  BP: 118/68 117/64 105/63 112/67  Pulse: 71 69 66 69  Resp: 18 20 19 18   Temp: 98 F (36.7 C) 98.6 F (37 C) 98.5 F (36.9  C) 97.8 F (36.6 C)  TempSrc: Oral Oral Oral Oral  SpO2: 98% 99% 96% 96%  Weight:    91.7 kg (202 lb 2.6 oz)  Height:    5' 11"  (1.803 m)    Intake/Output Summary (Last 24 hours) at 05/08/2017 1349 Last data filed at 05/08/2017 0729 Gross per 24 hour  Intake 680 ml  Output 650 ml  Net 30 ml    Filed Weights   05/06/17 1056 05/08/17 0753  Weight: 90.7 kg (200 lb) 91.7 kg (202 lb 2.6 oz)    Examination:  General exam: Appears calm and comfortable  Respiratory system: Clear to auscultation. Respiratory effort normal. Cardiovascular system: S1 & S2 heard, RRR. No JVD, murmurs, rubs, gallops or clicks. No pedal edema. Gastrointestinal system: Abdomen is nondistended, soft and nontender. No organomegaly or masses felt. Normal bowel sounds heard. Central nervous system: Alert and oriented. No focal neurological deficits. Extremities: Symmetric 5 x 5 power. Skin: Lesions noted on arms bilaterally with no active bleeding Psychiatry: Judgement and insight appear normal. Mood & affect appropriate.     Data Reviewed: I have personally reviewed following labs and imaging studies  CBC: Recent Labs  Lab 05/05/17 0821 05/06/17 1140 05/07/17 0635  WBC 8.7 6.2 4.7  NEUTROABS 7.2 4.9  --   HGB 11.5* 11.4* 10.6*  HCT 32.7* 32.0* 30.0*  MCV 108.3* 106.7* 106.0*  PLT 105* 107* 175*   Basic Metabolic Panel: Recent Labs  Lab 05/05/17 0821 05/06/17 1140 05/07/17 0635  NA 136 138 138  K 3.7 3.9 3.5  CL 108 109 108  CO2 22 23 23   GLUCOSE 107* 112* 91  BUN 16 21* 13  CREATININE 0.69 0.93 0.80  CALCIUM 8.7* 8.7* 8.3*   GFR: Estimated Creatinine Clearance: 120.9 mL/min (by C-G formula based on SCr of 0.8 mg/dL). Liver Function Tests: Recent Labs  Lab 05/05/17 0821 05/06/17 1140 05/07/17 0635  AST 27 24 22   ALT 25 21 18   ALKPHOS 134* 149* 130*  BILITOT 5.0* 3.4* 2.9*  PROT 6.7 6.8 6.4*  ALBUMIN 3.8 3.7 3.4*   Recent Labs  Lab 05/05/17 0821  LIPASE 17   No results for input(s): AMMONIA in the last 168 hours. Coagulation Profile: Recent Labs  Lab 05/05/17 0821  INR 1.32   Cardiac Enzymes: No results for input(s): CKTOTAL, CKMB, CKMBINDEX, TROPONINI in the last 168 hours. BNP (last 3 results) No results for input(s): PROBNP in the last 8760  hours. HbA1C: No results for input(s): HGBA1C in the last 72 hours. CBG: Recent Labs  Lab 05/07/17 1135 05/07/17 1636 05/07/17 2122 05/08/17 0738 05/08/17 1115  GLUCAP 104* 78 79 86 138*   Lipid Profile: No results for input(s): CHOL, HDL, LDLCALC, TRIG, CHOLHDL, LDLDIRECT in the last 72 hours. Thyroid Function Tests: No results for input(s): TSH, T4TOTAL, FREET4, T3FREE, THYROIDAB in the last 72 hours. Anemia Panel: No results for input(s): VITAMINB12, FOLATE, FERRITIN, TIBC, IRON, RETICCTPCT in the last 72 hours. Sepsis Labs: Recent Labs  Lab 05/05/17 1025  LATICACIDVEN 1.12    Recent Results (from the past 240 hour(s))  Blood culture (routine x 2)     Status: Abnormal   Collection Time: 05/05/17  8:20 AM  Result Value Ref Range Status   Specimen Description BLOOD RIGHT ARM  Final   Special Requests   Final    BOTTLES DRAWN AEROBIC AND ANAEROBIC Blood Culture adequate volume   Culture  Setup Time   Final    GRAM POSITIVE COCCI AEROBIC  BOTTLE ONLY CRITICAL VALUE NOTED.  VALUE IS CONSISTENT WITH PREVIOUSLY REPORTED AND CALLED VALUE.    Culture (A)  Final    STAPHYLOCOCCUS AUREUS SUSCEPTIBILITIES PERFORMED ON PREVIOUS CULTURE WITHIN THE LAST 5 DAYS. Performed at [facility], [place] [address], [place], [place] [address]    Report Status 05/08/2017 FINAL  Final  Blood culture (routine x 2)     Status: Abnormal   Collection Time: 05/05/17  8:25 AM  Result Value Ref Range Status   Specimen Description BLOOD LEFT ARM  Final   Special Requests   Final    BOTTLES DRAWN AEROBIC AND ANAEROBIC Blood Culture adequate volume   Culture  Setup Time   Final    GRAM POSITIVE COCCI IN CLUSTERS IN BOTH AEROBIC AND ANAEROBIC BOTTLES CRITICAL RESULT CALLED TO, READ BACK BY AND VERIFIED WITH: MSIMMS,RN @0826  05/06/17 BY LHOWARD Performed at [facility], [place] [address] [place], [place] [address]    Culture STAPHYLOCOCCUS AUREUS (A)  Final   Report Status 05/08/2017  FINAL  Final   Organism ID, Bacteria STAPHYLOCOCCUS AUREUS  Final      Susceptibility   Staphylococcus aureus - MIC*    CIPROFLOXACIN <=0.5 SENSITIVE Sensitive     ERYTHROMYCIN <=0.25 SENSITIVE Sensitive     GENTAMICIN <=0.5 SENSITIVE Sensitive     OXACILLIN <=0.25 SENSITIVE Sensitive     TETRACYCLINE <=1 SENSITIVE Sensitive     VANCOMYCIN 1 SENSITIVE Sensitive     TRIMETH/SULFA <=10 SENSITIVE Sensitive     CLINDAMYCIN <=0.25 SENSITIVE Sensitive     RIFAMPIN <=0.5 SENSITIVE Sensitive     Inducible Clindamycin NEGATIVE Sensitive     * STAPHYLOCOCCUS AUREUS  Blood Culture ID Panel (Reflexed)     Status: Abnormal   Collection Time: 05/05/17  8:25 AM  Result Value Ref Range Status   Enterococcus species NOT DETECTED NOT DETECTED Final   Listeria monocytogenes NOT DETECTED NOT DETECTED Final   Staphylococcus species DETECTED (A) NOT DETECTED Final    Comment: CRITICAL RESULT CALLED TO, READ BACK BY AND VERIFIED WITH: MSIMMS,RN @0826  05/06/17 BY LHOWARD    Staphylococcus aureus DETECTED (A) NOT DETECTED Final    Comment: Methicillin (oxacillin) susceptible Staphylococcus aureus (MSSA). Preferred therapy is anti staphylococcal beta lactam antibiotic (Cefazolin or Nafcillin), unless clinically contraindicated. CRITICAL RESULT CALLED TO, READ BACK BY AND VERIFIED WITH: MSIMMS,RN @0826  05/06/17 BY LHOWARD    Methicillin resistance NOT DETECTED NOT DETECTED Final   Streptococcus species NOT DETECTED NOT DETECTED Final   Streptococcus agalactiae NOT DETECTED NOT DETECTED Final   Streptococcus pneumoniae NOT DETECTED NOT DETECTED Final   Streptococcus pyogenes NOT DETECTED NOT DETECTED Final   Acinetobacter baumannii NOT DETECTED NOT DETECTED Final   Enterobacteriaceae species NOT DETECTED NOT DETECTED Final   Enterobacter cloacae complex NOT DETECTED NOT DETECTED Final   Escherichia coli NOT DETECTED NOT DETECTED Final   Klebsiella oxytoca NOT DETECTED NOT DETECTED Final   Klebsiella  pneumoniae NOT DETECTED NOT DETECTED Final   Proteus species NOT DETECTED NOT DETECTED Final   Serratia marcescens NOT DETECTED NOT DETECTED Final   Haemophilus influenzae NOT DETECTED NOT DETECTED Final   Neisseria meningitidis NOT DETECTED NOT DETECTED Final   Pseudomonas aeruginosa NOT DETECTED NOT DETECTED Final   Candida albicans NOT DETECTED NOT DETECTED Final   Candida glabrata NOT DETECTED NOT DETECTED Final   Candida krusei NOT DETECTED NOT DETECTED Final   Candida parapsilosis NOT DETECTED NOT DETECTED Final   Candida tropicalis NOT DETECTED NOT  DETECTED Final    Comment: Performed at [facility], [place] [address], [place], [place] [address]  Urine culture     Status: Abnormal   Collection Time: 05/05/17  9:15 AM  Result Value Ref Range Status   Specimen Description URINE, CLEAN CATCH  Final   Special Requests NONE  Final   Culture >=100,000 COLONIES/mL STAPHYLOCOCCUS AUREUS (A)  Final   Report Status 05/07/2017 FINAL  Final   Organism ID, Bacteria STAPHYLOCOCCUS AUREUS (A)  Final      Susceptibility   Staphylococcus aureus - MIC*    CIPROFLOXACIN <=0.5 SENSITIVE Sensitive     GENTAMICIN <=0.5 SENSITIVE Sensitive     NITROFURANTOIN <=16 SENSITIVE Sensitive     OXACILLIN 0.5 SENSITIVE Sensitive     TETRACYCLINE <=1 SENSITIVE Sensitive     VANCOMYCIN 1 SENSITIVE Sensitive     TRIMETH/SULFA <=10 SENSITIVE Sensitive     CLINDAMYCIN <=0.25 SENSITIVE Sensitive     RIFAMPIN <=0.5 SENSITIVE Sensitive     Inducible Clindamycin NEGATIVE Sensitive     * >=100,000 COLONIES/mL STAPHYLOCOCCUS AUREUS  Culture, blood (routine x 2)     Status: None (Preliminary result)   Collection Time: 05/06/17 11:30 AM  Result Value Ref Range Status   Specimen Description BLOOD LEFT ANTECUBITAL  Final   Special Requests   Final    BOTTLES DRAWN AEROBIC AND ANAEROBIC Blood Culture adequate volume   Culture   Final    NO GROWTH 2 DAYS Performed at [facility], [address],  [place], [place] [address]    Report Status PENDING  Incomplete  Culture, blood (routine x 2)     Status: None (Preliminary result)   Collection Time: 05/06/17 11:45 AM  Result Value Ref Range Status   Specimen Description BLOOD RIGHT ANTECUBITAL  Final   Special Requests   Final    BOTTLES DRAWN AEROBIC AND ANAEROBIC Blood Culture adequate volume   Culture   Final    NO GROWTH 2 DAYS Performed at [facility], [place] [address] [place], [place] [address]    Report Status PENDING  Incomplete  MRSA PCR Screening     Status: None   Collection Time: 05/06/17  4:43 PM  Result Value Ref Range Status   MRSA by PCR NEGATIVE NEGATIVE Final    Comment:        The GeneXpert MRSA Assay (FDA approved for NASAL specimens only), is one component of a comprehensive MRSA colonization surveillance program. It is not intended to diagnose MRSA infection nor to guide or monitor treatment for MRSA infections.   Culture, blood (Routine X 2) w Reflex to ID Panel     Status: None (Preliminary result)   Collection Time: 05/07/17  9:46 AM  Result Value Ref Range Status   Specimen Description BLOOD RIGHT ANTECUBITAL  Final   Special Requests   Final    BOTTLES DRAWN AEROBIC AND ANAEROBIC Blood Culture adequate volume   Culture   Final    NO GROWTH 1 DAY Performed at [facility], [place] [address] [place], [place] [address]    Report Status PENDING  Incomplete  Culture, blood (Routine X 2) w Reflex to ID Panel     Status: None (Preliminary result)   Collection Time: 05/07/17  9:53 AM  Result Value Ref Range Status   Specimen Description BLOOD RIGHT HAND  Final   Special Requests   Final    BOTTLES DRAWN AEROBIC AND ANAEROBIC Blood Culture  adequate volume   Culture   Final    NO GROWTH 1 DAY Performed at [facility], [place] [address] [place], [place] [address]    Report Status PENDING  Incomplete         Radiology Studies: Ct Chest W Contrast  Result Date: 05/06/2017 CLINICAL  DATA:  Rib pain.  Suspect rib fracture.  Bacteremia. EXAM: CT CHEST WITH CONTRAST TECHNIQUE: Multidetector CT imaging of the chest was performed during intravenous contrast administration. CONTRAST:  30m ISOVUE-300 IOPAMIDOL (ISOVUE-300) INJECTION 61% COMPARISON:  01/07/2013. FINDINGS: Cardiovascular: There is moderate cardiac enlargement. Aortic atherosclerosis. No pericardial effusion. Lad coronary artery calcifications noted. Mediastinum/Nodes: The trachea appears patent and is midline. Normal appearance of the esophagus. No adenopathy within the mediastinum or hilar regions. No axillary or supraclavicular adenopathy. Lungs/Pleura: Subsegmental subpleural atelectasis overlie posterior lower lobes noted. No pneumothorax or pulmonary contusion. Upper Abdomen: The spleen is incompletely visualized but appears enlarged measuring approximately 13 by 12.9 by 7.8 cm (volume = 680 cm^3). Hypertrophy of the lateral segment of left lobe of liver and caudate lobe of liver noted. Contour the liver is slightly irregular. No focal liver abnormality. Cyst arises from the upper pole of the left kidney. Musculoskeletal: Degenerative disc disease identified within the thoracic spine. There are acute fractures involving the lateral aspect of the right fifth, sixth and seventh ribs. IMPRESSION: 1. Acute right fifth, sixth and seventh rib fracture deformities 2. Morphologic features of liver consistent with cirrhosis. Splenomegaly. 3. Aortic Atherosclerosis (ICD10-I70.0). Lad coronary artery calcifications noted. Electronically Signed   By: TKerby MoorsM.D.   On: 05/06/2017 17:49        Scheduled Meds: . aspirin EC  81 mg Oral QHS  . diltiazem  180 mg Oral QHS  . enoxaparin (LOVENOX) injection  40 mg Subcutaneous Q24H  . ferrous sulfate  325 mg Oral QHS  . fluorouracil  1 application Topical QHS  . folic acid  2 mg Oral Daily  . insulin aspart  0-15 Units Subcutaneous TID WC  . insulin aspart  0-5 Units  Subcutaneous QHS  . lamoTRIgine  100 mg Oral BID  . mesalamine  1.2 g Oral BID  . methscopolamine  5 mg Oral BID  . pantoprazole  40 mg Oral Daily  . sertraline  200 mg Oral Q breakfast  . traZODone  50-100 mg Oral QHS   Continuous Infusions: .  ceFAZolin (ANCEF) IV Stopped (05/08/17 0547)     LOS: 2 days    Time spent: 30 minutes    ALoretha Stapler MD [facility] Pager [phone] If 7PM-7AM, please contact night-coverage [email or web address] Password [email or web address] 05/08/2017, 1:49 PM

## 2017-05-08 NOTE — Progress Notes (Addendum)
Subjective: No new complaints   Antibiotics:  Anti-infectives (From admission, onward)   Start     Dose/Rate Route Frequency Ordered Stop   05/06/17 2000  ceFAZolin (ANCEF) IVPB 2g/100 mL premix     2 g 200 mL/hr over 30 Minutes Intravenous Every 8 hours 05/06/17 1155     05/06/17 1230  ceFAZolin (ANCEF) IVPB 2g/100 mL premix     2 g 200 mL/hr over 30 Minutes Intravenous NOW 05/06/17 1155 05/06/17 1426      Medications: Scheduled Meds: . aspirin EC  81 mg Oral QHS  . diltiazem  180 mg Oral QHS  . enoxaparin (LOVENOX) injection  40 mg Subcutaneous Q24H  . ferrous sulfate  325 mg Oral QHS  . fluorouracil  1 application Topical QHS  . folic acid  2 mg Oral Daily  . insulin aspart  0-15 Units Subcutaneous TID WC  . insulin aspart  0-5 Units Subcutaneous QHS  . lamoTRIgine  100 mg Oral BID  . mesalamine  1.2 g Oral BID  . methscopolamine  5 mg Oral BID  . pantoprazole  40 mg Oral Daily  . sertraline  200 mg Oral Q breakfast  . traZODone  50-100 mg Oral QHS   Continuous Infusions: .  ceFAZolin (ANCEF) IV Stopped (05/08/17 0547)   PRN Meds:.acetaminophen **OR** acetaminophen, ketorolac, polyethylene glycol, zolpidem    Objective: Weight change:   Intake/Output Summary (Last 24 hours) at 05/08/2017 1344 Last data filed at 05/08/2017 0729 Gross per 24 hour  Intake 680 ml  Output 650 ml  Net 30 ml   Blood pressure 112/67, pulse 69, temperature 97.8 F (36.6 C), temperature source Oral, resp. rate 18, height 5' 11"  (1.803 m), weight 202 lb 2.6 oz (91.7 kg), SpO2 96 %. Temp:  [97.8 F (36.6 C)-98.6 F (37 C)] 97.8 F (36.6 C) (11/15 0753) Pulse Rate:  [66-71] 69 (11/15 0753) Resp:  [18-20] 18 (11/15 0753) BP: (105-118)/(63-68) 112/67 (11/15 0753) SpO2:  [96 %-99 %] 96 % (11/15 0753) Weight:  [202 lb 2.6 oz (91.7 kg)] 202 lb 2.6 oz (91.7 kg) (11/15 0753)  Physical Exam: General: Alert and awake, oriented x3, not in any acute distress. HEENT: anicteric  sclera,, EOMI CV: rrr no mgr Pulm: CTAB GI: Soft, ND, nT  05/07/17:          Neuro: nonfocal  CBC: CBC Latest Ref Rng & Units 05/07/2017 05/06/2017 05/05/2017  WBC 4.0 - 10.5 K/uL 4.7 6.2 8.7  Hemoglobin 13.0 - 17.0 g/dL 10.6(L) 11.4(L) 11.5(L)  Hematocrit 39.0 - 52.0 % 30.0(L) 32.0(L) 32.7(L)  Platelets 150 - 400 K/uL 108(L) 107(L) 105(L)      BMET Recent Labs    05/06/17 1140 05/07/17 0635  NA 138 138  K 3.9 3.5  CL 109 108  CO2 23 23  GLUCOSE 112* 91  BUN 21* 13  CREATININE 0.93 0.80  CALCIUM 8.7* 8.3*     Liver Panel  Recent Labs    05/06/17 1140 05/07/17 0635  PROT 6.8 6.4*  ALBUMIN 3.7 3.4*  AST 24 22  ALT 21 18  ALKPHOS 149* 130*  BILITOT 3.4* 2.9*       Sedimentation Rate No results for input(s): ESRSEDRATE in the last 72 hours. C-Reactive Protein No results for input(s): CRP in the last 72 hours.  Micro Results: Recent Results (from the past 720 hour(s))  Blood culture (routine x 2)     Status: Abnormal   Collection Time: 05/05/17  8:20 AM  Result Value Ref Range Status   Specimen Description BLOOD RIGHT ARM  Final   Special Requests   Final    BOTTLES DRAWN AEROBIC AND ANAEROBIC Blood Culture adequate volume   Culture  Setup Time   Final    GRAM POSITIVE COCCI AEROBIC BOTTLE ONLY CRITICAL VALUE NOTED.  VALUE IS CONSISTENT WITH PREVIOUSLY REPORTED AND CALLED VALUE.    Culture (A)  Final    STAPHYLOCOCCUS AUREUS SUSCEPTIBILITIES PERFORMED ON PREVIOUS CULTURE WITHIN THE LAST 5 DAYS. Performed at [facility], [place] [address], [place], [place] [address]    Report Status 05/08/2017 FINAL  Final  Blood culture (routine x 2)     Status: Abnormal   Collection Time: 05/05/17  8:25 AM  Result Value Ref Range Status   Specimen Description BLOOD LEFT ARM  Final   Special Requests   Final    BOTTLES DRAWN AEROBIC AND ANAEROBIC Blood Culture adequate volume   Culture  Setup Time   Final    GRAM POSITIVE COCCI IN CLUSTERS IN  BOTH AEROBIC AND ANAEROBIC BOTTLES CRITICAL RESULT CALLED TO, READ BACK BY AND VERIFIED WITH: MSIMMS,RN @0826  05/06/17 BY LHOWARD Performed at [facility], [place] [address] [place], [place] [address]    Culture STAPHYLOCOCCUS AUREUS (A)  Final   Report Status 05/08/2017 FINAL  Final   Organism ID, Bacteria STAPHYLOCOCCUS AUREUS  Final      Susceptibility   Staphylococcus aureus - MIC*    CIPROFLOXACIN <=0.5 SENSITIVE Sensitive     ERYTHROMYCIN <=0.25 SENSITIVE Sensitive     GENTAMICIN <=0.5 SENSITIVE Sensitive     OXACILLIN <=0.25 SENSITIVE Sensitive     TETRACYCLINE <=1 SENSITIVE Sensitive     VANCOMYCIN 1 SENSITIVE Sensitive     TRIMETH/SULFA <=10 SENSITIVE Sensitive     CLINDAMYCIN <=0.25 SENSITIVE Sensitive     RIFAMPIN <=0.5 SENSITIVE Sensitive     Inducible Clindamycin NEGATIVE Sensitive     * STAPHYLOCOCCUS AUREUS  Blood Culture ID Panel (Reflexed)     Status: Abnormal   Collection Time: 05/05/17  8:25 AM  Result Value Ref Range Status   Enterococcus species NOT DETECTED NOT DETECTED Final   Listeria monocytogenes NOT DETECTED NOT DETECTED Final   Staphylococcus species DETECTED (A) NOT DETECTED Final    Comment: CRITICAL RESULT CALLED TO, READ BACK BY AND VERIFIED WITH: MSIMMS,RN @0826  05/06/17 BY LHOWARD    Staphylococcus aureus DETECTED (A) NOT DETECTED Final    Comment: Methicillin (oxacillin) susceptible Staphylococcus aureus (MSSA). Preferred therapy is anti staphylococcal beta lactam antibiotic (Cefazolin or Nafcillin), unless clinically contraindicated. CRITICAL RESULT CALLED TO, READ BACK BY AND VERIFIED WITH: MSIMMS,RN @0826  05/06/17 BY LHOWARD    Methicillin resistance NOT DETECTED NOT DETECTED Final   Streptococcus species NOT DETECTED NOT DETECTED Final   Streptococcus agalactiae NOT DETECTED NOT DETECTED Final   Streptococcus pneumoniae NOT DETECTED NOT DETECTED Final   Streptococcus pyogenes NOT DETECTED NOT DETECTED Final   Acinetobacter baumannii  NOT DETECTED NOT DETECTED Final   Enterobacteriaceae species NOT DETECTED NOT DETECTED Final   Enterobacter cloacae complex NOT DETECTED NOT DETECTED Final   Escherichia coli NOT DETECTED NOT DETECTED Final   Klebsiella oxytoca NOT DETECTED NOT DETECTED Final   Klebsiella pneumoniae NOT DETECTED NOT DETECTED Final   Proteus species NOT DETECTED NOT DETECTED Final   Serratia marcescens NOT DETECTED NOT DETECTED Final   Haemophilus influenzae NOT DETECTED NOT DETECTED Final   Neisseria meningitidis NOT DETECTED NOT DETECTED Final  Pseudomonas aeruginosa NOT DETECTED NOT DETECTED Final   Candida albicans NOT DETECTED NOT DETECTED Final   Candida glabrata NOT DETECTED NOT DETECTED Final   Candida krusei NOT DETECTED NOT DETECTED Final   Candida parapsilosis NOT DETECTED NOT DETECTED Final   Candida tropicalis NOT DETECTED NOT DETECTED Final    Comment: Performed at [facility], [place] [address], [place], [place] [address]  Urine culture     Status: Abnormal   Collection Time: 05/05/17  9:15 AM  Result Value Ref Range Status   Specimen Description URINE, CLEAN CATCH  Final   Special Requests NONE  Final   Culture >=100,000 COLONIES/mL STAPHYLOCOCCUS AUREUS (A)  Final   Report Status 05/07/2017 FINAL  Final   Organism ID, Bacteria STAPHYLOCOCCUS AUREUS (A)  Final      Susceptibility   Staphylococcus aureus - MIC*    CIPROFLOXACIN <=0.5 SENSITIVE Sensitive     GENTAMICIN <=0.5 SENSITIVE Sensitive     NITROFURANTOIN <=16 SENSITIVE Sensitive     OXACILLIN 0.5 SENSITIVE Sensitive     TETRACYCLINE <=1 SENSITIVE Sensitive     VANCOMYCIN 1 SENSITIVE Sensitive     TRIMETH/SULFA <=10 SENSITIVE Sensitive     CLINDAMYCIN <=0.25 SENSITIVE Sensitive     RIFAMPIN <=0.5 SENSITIVE Sensitive     Inducible Clindamycin NEGATIVE Sensitive     * >=100,000 COLONIES/mL STAPHYLOCOCCUS AUREUS  Culture, blood (routine x 2)     Status: None (Preliminary result)   Collection Time: 05/06/17 11:30 AM    Result Value Ref Range Status   Specimen Description BLOOD LEFT ANTECUBITAL  Final   Special Requests   Final    BOTTLES DRAWN AEROBIC AND ANAEROBIC Blood Culture adequate volume   Culture   Final    NO GROWTH 2 DAYS Performed at [facility], [address], [place], [place] [address]    Report Status PENDING  Incomplete  Culture, blood (routine x 2)     Status: None (Preliminary result)   Collection Time: 05/06/17 11:45 AM  Result Value Ref Range Status   Specimen Description BLOOD RIGHT ANTECUBITAL  Final   Special Requests   Final    BOTTLES DRAWN AEROBIC AND ANAEROBIC Blood Culture adequate volume   Culture   Final    NO GROWTH 2 DAYS Performed at [facility], [place] [address], [place], [place] [address]    Report Status PENDING  Incomplete  MRSA PCR Screening     Status: None   Collection Time: 05/06/17  4:43 PM  Result Value Ref Range Status   MRSA by PCR NEGATIVE NEGATIVE Final    Comment:        The GeneXpert MRSA Assay (FDA approved for NASAL specimens only), is one component of a comprehensive MRSA colonization surveillance program. It is not intended to diagnose MRSA infection nor to guide or monitor treatment for MRSA infections.   Culture, blood (Routine X 2) w Reflex to ID Panel     Status: None (Preliminary result)   Collection Time: 05/07/17  9:46 AM  Result Value Ref Range Status   Specimen Description BLOOD RIGHT ANTECUBITAL  Final   Special Requests   Final    BOTTLES DRAWN AEROBIC AND ANAEROBIC Blood Culture adequate volume   Culture   Final    NO GROWTH 1 DAY Performed at [facility], [place] [address], [place], [place] [address]    Report Status PENDING  Incomplete  Culture, blood (Routine X 2) w Reflex  to ID Panel     Status: None (Preliminary result)   Collection Time: 05/07/17  9:53 AM  Result Value Ref Range Status   Specimen Description BLOOD RIGHT HAND  Final   Special Requests   Final    BOTTLES DRAWN AEROBIC AND  ANAEROBIC Blood Culture adequate volume   Culture   Final    NO GROWTH 1 DAY Performed at [facility], [address] [place], [place] [address]    Report Status PENDING  Incomplete    Studies/Results: Ct Chest W Contrast  Result Date: 05/06/2017 CLINICAL DATA:  Rib pain.  Suspect rib fracture.  Bacteremia. EXAM: CT CHEST WITH CONTRAST TECHNIQUE: Multidetector CT imaging of the chest was performed during intravenous contrast administration. CONTRAST:  47m ISOVUE-300 IOPAMIDOL (ISOVUE-300) INJECTION 61% COMPARISON:  01/07/2013. FINDINGS: Cardiovascular: There is moderate cardiac enlargement. Aortic atherosclerosis. No pericardial effusion. Lad coronary artery calcifications noted. Mediastinum/Nodes: The trachea appears patent and is midline. Normal appearance of the esophagus. No adenopathy within the mediastinum or hilar regions. No axillary or supraclavicular adenopathy. Lungs/Pleura: Subsegmental subpleural atelectasis overlie posterior lower lobes noted. No pneumothorax or pulmonary contusion. Upper Abdomen: The spleen is incompletely visualized but appears enlarged measuring approximately 13 by 12.9 by 7.8 cm (volume = 680 cm^3). Hypertrophy of the lateral segment of left lobe of liver and caudate lobe of liver noted. Contour the liver is slightly irregular. No focal liver abnormality. Cyst arises from the upper pole of the left kidney. Musculoskeletal: Degenerative disc disease identified within the thoracic spine. There are acute fractures involving the lateral aspect of the right fifth, sixth and seventh ribs. IMPRESSION: 1. Acute right fifth, sixth and seventh rib fracture deformities 2. Morphologic features of liver consistent with cirrhosis. Splenomegaly. 3. Aortic Atherosclerosis (ICD10-I70.0). Lad coronary artery calcifications noted. Electronically Signed   By: TKerby MoorsM.D.   On: 05/06/2017 17:49      Assessment/Plan:  INTERVAL HISTORY:  Blood cultures from admission  (ON ORAL LEVAQUIN to which his MSSA was S to ) NO GROWTH  2 DAYS AND POST ANCEF NG X 1 DAY   Principal Problem:   Bacteremia due to Staphylococcus aureus Active Problems:   Essential hypertension   CROHN'S DISEASE, LARGE AND SMALL INTESTINES   DM2 (diabetes mellitus, type 2) (HCC)   OSA on CPAP   Staphylococcus aureus bacteremia    [name] Depreeis a 56y.o. male with  MSSA bacteremia without clear cut source, potentially from skin   #1.      [facility] Staphylococcus aureus bacteremia   Staphylococcus aureus bacteremia (SAB) is associated with a high rate of complications and mortality.  Specific aspects of clinical management are critical to optimizing the outcome of patients with SAB.  Therefore, [facility] ([facility] has initiated an intervention aimed at [facility]  To do so, Infectious Diseases physicians are providing an evidence-based consult for the management of all patients with SAB.     Yes No Comments  Perform follow-up blood cultures (even if the patient is afebrile) to ensure clearance of bacteremia [x]  []  Repeat blood cultures on levaquin done and AFTER  Ancef NO GROWTH  Remove vascular catheter and obtain follow-up blood cultures after the removal of the catheter [x]  []  DO NOT PLACE PICC UNTIL WE HAVE MINIMUM OF 72-98 hrs with NO GROWTH  Perform echocardiography to evaluate for endocarditis (transthoracic ECHO is 40-50% sensitive, TEE is > 90%  sensitive) []  []  Please keep in mind, that neither test can definitively EXCLUDE endocarditis, and that should clinical suspicion remain high for endocarditis the patient should then still be treated with an "endocarditis" duration of therapy = 6 weeks  I SPOKE WITH TRISH AND TEE WILL BE SCHEDULED FOR TOMORROW  Consult electrophysiologist to evaluate implanted cardiac device (pacemaker, ICD) []  []  NA  Ensure source control [x]  []  Have  all abscesses been drained effectively? Have deep seeded infections (septic joints or osteomyelitis) had appropriate surgical debridement?  Source unclear, urine NOT typical, skin potentially a source but not in need of I and D  Investigate for "metastatic" sites of infection [x]  []  Does the patient have ANY symptom or physical exam finding that would suggest a deeper infection (back or neck pain that may be suggestive of vertebral osteomyelitis or epidural abscess, muscle pain that could be a symptom of pyomyositis)?  Keep in mind that for deep seeded infections MRI imaging with contrast is preferred rather than other often insensitive tests such as plain x-rays, especially early in a patient's presentation.  No other obvious sites of been identified  Change antibiotic therapy to cefazolin []  []  Beta-lactam antibiotics are preferred for MSSA due to higher cure rates.   If on Vancomycin, goal trough should be 15 - 20 mcg/mL  Estimated duration of IV antibiotic therapy: 4-6 weeks [x]  []  Consult case management for probably prolonged outpatient IV antibiotic therapy    I spent greater than 35 minutes with the patient including greater than 50% of time in face to face counsel of the patient re nature of MSSA bacteremia, how home IV abx are given, need for TEE and in coordination of his care wit Cardiology.     LOS: 2 days   Alcide Evener 05/08/2017, 1:44 PM

## 2017-05-08 NOTE — Progress Notes (Signed)
[facility] Infusion Coordinator will follow pt with ID team to support IV ABX at DC as ordered.  If patient discharges after hours, please call [phone].   Larry Sierras 05/08/2017, 11:40 AM

## 2017-05-08 NOTE — Telephone Encounter (Signed)
Post ED Visit - Positive Culture Follow-up  Culture report reviewed by antimicrobial stewardship pharmacist:  []  Elenor Quinones, Pharm.D. []  Heide Guile, Pharm.D., BCPS AQ-ID []  Parks Neptune, Pharm.D., BCPS []  Alycia Rossetti, Pharm.D., BCPS []  [place], [place].D., BCPS, AAHIVP []  Legrand Como, Pharm.D., BCPS, AAHIVP []  Salome Arnt, PharmD, BCPS []  Dimitri Ped, PharmD, BCPS []  Vincenza Hews, PharmD, BCPS X  Charlene Brooke, RPh  Positive urine culture  Treated with Levofloxacin, organism sensitive to the same and no further patient follow-up is required at this time.  56 yo M currently admitted  At [facility] for S aureus bacteremia.  No action required  Dortha Kern 05/08/2017, 4:03 PM

## 2017-05-09 ENCOUNTER — Encounter (HOSPITAL_COMMUNITY): Payer: Self-pay | Admitting: Certified Registered Nurse Anesthetist

## 2017-05-09 ENCOUNTER — Inpatient Hospital Stay (HOSPITAL_COMMUNITY): Payer: 59

## 2017-05-09 ENCOUNTER — Inpatient Hospital Stay (HOSPITAL_COMMUNITY): Payer: 59 | Admitting: Certified Registered Nurse Anesthetist

## 2017-05-09 ENCOUNTER — Encounter (HOSPITAL_COMMUNITY)
Admission: EM | Disposition: A | Discharge status: Home / Self Care | Payer: Self-pay | Source: Home / Self Care | Attending: Family Medicine

## 2017-05-09 ENCOUNTER — Telehealth: Payer: Self-pay

## 2017-05-09 DIAGNOSIS — I340 Nonrheumatic mitral (valve) insufficiency: Secondary | ICD-10-CM

## 2017-05-09 HISTORY — PX: TEE WITHOUT CARDIOVERSION: SHX5443

## 2017-05-09 LAB — GLUCOSE, CAPILLARY
GLUCOSE-CAPILLARY: 127 mg/dL — AB (ref 65–99)
Glucose-Capillary: 89 mg/dL (ref 65–99)
Glucose-Capillary: 99 mg/dL (ref 65–99)

## 2017-05-09 SURGERY — ECHOCARDIOGRAM, TRANSESOPHAGEAL
Anesthesia: Monitor Anesthesia Care

## 2017-05-09 MED ORDER — PROPOFOL 500 MG/50ML IV EMUL
INTRAVENOUS | Status: DC | PRN
  Administered 2017-05-09: 125 ug/kg/min via INTRAVENOUS

## 2017-05-09 MED ORDER — SODIUM CHLORIDE 0.9 % IV SOLN
INTRAVENOUS | Status: AC | PRN
  Administered 2017-05-09: 500 mL via INTRAVENOUS

## 2017-05-09 MED ORDER — PROPOFOL 10 MG/ML IV BOLUS
INTRAVENOUS | Status: DC | PRN
  Administered 2017-05-09: 20 mg via INTRAVENOUS
  Administered 2017-05-09: 30 mg via INTRAVENOUS

## 2017-05-09 NOTE — CV Procedure (Signed)
   Transesophageal echocardiogram  Indications: Bacteremia  Timeout performed  Anesthesia present with propofol  Findings: - No vegetations, no signs of endocarditis -Normal ejection fraction 60% - Mild mitral regurgitation - Small patent foramen ovale, PFO with right to left shunt, small amount of bubble crossover noted with Valsalva maneuver.  This should be of no clinical consequence.  Candee Furbish, MD

## 2017-05-09 NOTE — H&P (View-Only) (Signed)
Subjective: No new complaints   Antibiotics:  Anti-infectives (From admission, onward)   Start     Dose/Rate Route Frequency Ordered Stop   05/06/17 2000  ceFAZolin (ANCEF) IVPB 2g/100 mL premix     2 g 200 mL/hr over 30 Minutes Intravenous Every 8 hours 05/06/17 1155     05/06/17 1230  ceFAZolin (ANCEF) IVPB 2g/100 mL premix     2 g 200 mL/hr over 30 Minutes Intravenous NOW 05/06/17 1155 05/06/17 1426      Medications: Scheduled Meds: . aspirin EC  81 mg Oral QHS  . diltiazem  180 mg Oral QHS  . enoxaparin (LOVENOX) injection  40 mg Subcutaneous Q24H  . ferrous sulfate  325 mg Oral QHS  . fluorouracil  1 application Topical QHS  . folic acid  2 mg Oral Daily  . insulin aspart  0-15 Units Subcutaneous TID WC  . insulin aspart  0-5 Units Subcutaneous QHS  . lamoTRIgine  100 mg Oral BID  . mesalamine  1.2 g Oral BID  . methscopolamine  5 mg Oral BID  . pantoprazole  40 mg Oral Daily  . sertraline  200 mg Oral Q breakfast  . traZODone  50-100 mg Oral QHS   Continuous Infusions: .  ceFAZolin (ANCEF) IV 2 g (05/09/17 0558)   PRN Meds:.acetaminophen **OR** acetaminophen, ketorolac, polyethylene glycol, zolpidem    Objective: Weight change:   Intake/Output Summary (Last 24 hours) at 05/09/2017 1244 Last data filed at 05/09/2017 0611 Gross per 24 hour  Intake 680 ml  Output 700 ml  Net -20 ml   Blood pressure 113/70, pulse 69, temperature 97.7 F (36.5 C), temperature source Axillary, resp. rate 18, height 5' 11"  (1.803 m), weight 202 lb 2.6 oz (91.7 kg), SpO2 96 %. Temp:  [97.6 F (36.4 C)-98.5 F (36.9 C)] 97.7 F (36.5 C) (11/16 0524) Pulse Rate:  [69-84] 69 (11/16 0524) Resp:  [17-18] 18 (11/16 0524) BP: (113-128)/(62-70) 113/70 (11/16 0524) SpO2:  [96 %-99 %] 96 % (11/16 0524)  Physical Exam: General: Alert and awake, oriented x3, not in any acute distress. HEENT: anicteric sclera,, EOMI CV: rrr no mgr Pulm: CTAB GI: Soft, ND,  nT  05/07/17:          Neuro: nonfocal  CBC: CBC Latest Ref Rng & Units 05/07/2017 05/06/2017 05/05/2017  WBC 4.0 - 10.5 K/uL 4.7 6.2 8.7  Hemoglobin 13.0 - 17.0 g/dL 10.6(L) 11.4(L) 11.5(L)  Hematocrit 39.0 - 52.0 % 30.0(L) 32.0(L) 32.7(L)  Platelets 150 - 400 K/uL 108(L) 107(L) 105(L)      BMET Recent Labs    05/07/17 0635  NA 138  K 3.5  CL 108  CO2 23  GLUCOSE 91  BUN 13  CREATININE 0.80  CALCIUM 8.3*     Liver Panel  Recent Labs    05/07/17 0635  PROT 6.4*  ALBUMIN 3.4*  AST 22  ALT 18  ALKPHOS 130*  BILITOT 2.9*       Sedimentation Rate No results for input(s): ESRSEDRATE in the last 72 hours. C-Reactive Protein No results for input(s): CRP in the last 72 hours.  Micro Results: Recent Results (from the past 720 hour(s))  Blood culture (routine x 2)     Status: Abnormal   Collection Time: 05/05/17  8:20 AM  Result Value Ref Range Status   Specimen Description BLOOD RIGHT ARM  Final   Special Requests   Final    BOTTLES DRAWN AEROBIC AND ANAEROBIC  Blood Culture adequate volume   Culture  Setup Time   Final    GRAM POSITIVE COCCI AEROBIC BOTTLE ONLY CRITICAL VALUE NOTED.  VALUE IS CONSISTENT WITH PREVIOUSLY REPORTED AND CALLED VALUE.    Culture (A)  Final    STAPHYLOCOCCUS AUREUS SUSCEPTIBILITIES PERFORMED ON PREVIOUS CULTURE WITHIN THE LAST 5 DAYS. Performed at [facility], [place] [address], [place], [place] [address]    Report Status 05/08/2017 FINAL  Final  Blood culture (routine x 2)     Status: Abnormal   Collection Time: 05/05/17  8:25 AM  Result Value Ref Range Status   Specimen Description BLOOD LEFT ARM  Final   Special Requests   Final    BOTTLES DRAWN AEROBIC AND ANAEROBIC Blood Culture adequate volume   Culture  Setup Time   Final    GRAM POSITIVE COCCI IN CLUSTERS IN BOTH AEROBIC AND ANAEROBIC BOTTLES CRITICAL RESULT CALLED TO, READ BACK BY AND VERIFIED WITH: MSIMMS,RN @0826  05/06/17 BY LHOWARD Performed at  [facility], [place] [address], [place], [place] [address]    Culture STAPHYLOCOCCUS AUREUS (A)  Final   Report Status 05/08/2017 FINAL  Final   Organism ID, Bacteria STAPHYLOCOCCUS AUREUS  Final      Susceptibility   Staphylococcus aureus - MIC*    CIPROFLOXACIN <=0.5 SENSITIVE Sensitive     ERYTHROMYCIN <=0.25 SENSITIVE Sensitive     GENTAMICIN <=0.5 SENSITIVE Sensitive     OXACILLIN <=0.25 SENSITIVE Sensitive     TETRACYCLINE <=1 SENSITIVE Sensitive     VANCOMYCIN 1 SENSITIVE Sensitive     TRIMETH/SULFA <=10 SENSITIVE Sensitive     CLINDAMYCIN <=0.25 SENSITIVE Sensitive     RIFAMPIN <=0.5 SENSITIVE Sensitive     Inducible Clindamycin NEGATIVE Sensitive     * STAPHYLOCOCCUS AUREUS  Blood Culture ID Panel (Reflexed)     Status: Abnormal   Collection Time: 05/05/17  8:25 AM  Result Value Ref Range Status   Enterococcus species NOT DETECTED NOT DETECTED Final   Listeria monocytogenes NOT DETECTED NOT DETECTED Final   Staphylococcus species DETECTED (A) NOT DETECTED Final    Comment: CRITICAL RESULT CALLED TO, READ BACK BY AND VERIFIED WITH: MSIMMS,RN @0826  05/06/17 BY LHOWARD    Staphylococcus aureus DETECTED (A) NOT DETECTED Final    Comment: Methicillin (oxacillin) susceptible Staphylococcus aureus (MSSA). Preferred therapy is anti staphylococcal beta lactam antibiotic (Cefazolin or Nafcillin), unless clinically contraindicated. CRITICAL RESULT CALLED TO, READ BACK BY AND VERIFIED WITH: MSIMMS,RN @0826  05/06/17 BY LHOWARD    Methicillin resistance NOT DETECTED NOT DETECTED Final   Streptococcus species NOT DETECTED NOT DETECTED Final   Streptococcus agalactiae NOT DETECTED NOT DETECTED Final   Streptococcus pneumoniae NOT DETECTED NOT DETECTED Final   Streptococcus pyogenes NOT DETECTED NOT DETECTED Final   Acinetobacter baumannii NOT DETECTED NOT DETECTED Final   Enterobacteriaceae species NOT DETECTED NOT DETECTED Final   Enterobacter cloacae complex NOT DETECTED NOT  DETECTED Final   Escherichia coli NOT DETECTED NOT DETECTED Final   Klebsiella oxytoca NOT DETECTED NOT DETECTED Final   Klebsiella pneumoniae NOT DETECTED NOT DETECTED Final   Proteus species NOT DETECTED NOT DETECTED Final   Serratia marcescens NOT DETECTED NOT DETECTED Final   Haemophilus influenzae NOT DETECTED NOT DETECTED Final   Neisseria meningitidis NOT DETECTED NOT DETECTED Final   Pseudomonas aeruginosa NOT DETECTED NOT DETECTED Final   Candida albicans NOT DETECTED NOT DETECTED Final   Candida glabrata NOT DETECTED NOT DETECTED Final   Candida krusei NOT  DETECTED NOT DETECTED Final   Candida parapsilosis NOT DETECTED NOT DETECTED Final   Candida tropicalis NOT DETECTED NOT DETECTED Final    Comment: Performed at [facility], [place] [address] [place], [place] [address]  Urine culture     Status: Abnormal   Collection Time: 05/05/17  9:15 AM  Result Value Ref Range Status   Specimen Description URINE, CLEAN CATCH  Final   Special Requests NONE  Final   Culture >=100,000 COLONIES/mL STAPHYLOCOCCUS AUREUS (A)  Final   Report Status 05/07/2017 FINAL  Final   Organism ID, Bacteria STAPHYLOCOCCUS AUREUS (A)  Final      Susceptibility   Staphylococcus aureus - MIC*    CIPROFLOXACIN <=0.5 SENSITIVE Sensitive     GENTAMICIN <=0.5 SENSITIVE Sensitive     NITROFURANTOIN <=16 SENSITIVE Sensitive     OXACILLIN 0.5 SENSITIVE Sensitive     TETRACYCLINE <=1 SENSITIVE Sensitive     VANCOMYCIN 1 SENSITIVE Sensitive     TRIMETH/SULFA <=10 SENSITIVE Sensitive     CLINDAMYCIN <=0.25 SENSITIVE Sensitive     RIFAMPIN <=0.5 SENSITIVE Sensitive     Inducible Clindamycin NEGATIVE Sensitive     * >=100,000 COLONIES/mL STAPHYLOCOCCUS AUREUS  Culture, blood (routine x 2)     Status: None (Preliminary result)   Collection Time: 05/06/17 11:30 AM  Result Value Ref Range Status   Specimen Description BLOOD LEFT ANTECUBITAL  Final   Special Requests   Final    BOTTLES DRAWN AEROBIC AND  ANAEROBIC Blood Culture adequate volume   Culture   Final    NO GROWTH 3 DAYS Performed at [facility], [address], [place], [place] [address]    Report Status PENDING  Incomplete  Culture, blood (routine x 2)     Status: None (Preliminary result)   Collection Time: 05/06/17 11:45 AM  Result Value Ref Range Status   Specimen Description BLOOD RIGHT ANTECUBITAL  Final   Special Requests   Final    BOTTLES DRAWN AEROBIC AND ANAEROBIC Blood Culture adequate volume   Culture   Final    NO GROWTH 3 DAYS Performed at [facility], [place] [address], [place], [place] [address]    Report Status PENDING  Incomplete  MRSA PCR Screening     Status: None   Collection Time: 05/06/17  4:43 PM  Result Value Ref Range Status   MRSA by PCR NEGATIVE NEGATIVE Final    Comment:        The GeneXpert MRSA Assay (FDA approved for NASAL specimens only), is one component of a comprehensive MRSA colonization surveillance program. It is not intended to diagnose MRSA infection nor to guide or monitor treatment for MRSA infections.   Culture, blood (Routine X 2) w Reflex to ID Panel     Status: None (Preliminary result)   Collection Time: 05/07/17  9:46 AM  Result Value Ref Range Status   Specimen Description BLOOD RIGHT ANTECUBITAL  Final   Special Requests   Final    BOTTLES DRAWN AEROBIC AND ANAEROBIC Blood Culture adequate volume   Culture   Final    NO GROWTH 2 DAYS Performed at [facility], [address], [place], [place] [address]    Report Status PENDING  Incomplete  Culture, blood (Routine X 2) w Reflex to ID Panel     Status: None (Preliminary result)   Collection Time: 05/07/17  9:53 AM  Result Value Ref Range Status   Specimen Description BLOOD RIGHT  HAND  Final   Special Requests   Final    BOTTLES DRAWN AEROBIC AND ANAEROBIC Blood Culture adequate volume   Culture   Final    NO GROWTH 2 DAYS Performed at [facility], [address], [place],  [place] [address]    Report Status PENDING  Incomplete    Studies/Results: No results found.    Assessment/Plan:  INTERVAL HISTORY:  TEE scheduled for today   Principal Problem:   Bacteremia due to Staphylococcus aureus Active Problems:   Essential hypertension   CROHN'S DISEASE, LARGE AND SMALL INTESTINES   DM2 (diabetes mellitus, type 2) (HCC)   OSA on CPAP   Staphylococcus aureus bacteremia    [name] [name] is a 56 y.o. male with  MSSA bacteremia without clear cut source, potentially from skin   #1.      [facility] Staphylococcus aureus bacteremia   Staphylococcus aureus bacteremia (SAB) is associated with a high rate of complications and mortality.  Specific aspects of clinical management are critical to optimizing the outcome of patients with SAB.  Therefore, the [facility]imicrobial Management Team [facility]) has initiated an intervention aimed at improving the management of SAB at [facility].  To do so, Infectious Diseases physicians are providing an evidence-based consult for the management of all patients with SAB.     Yes No Comments  Perform follow-up blood cultures (even if the patient is afebrile) to ensure clearance of bacteremia [x]  []  Repeat blood cultures on levaquin done and AFTER  Ancef NO GROWTH  Remove vascular catheter and obtain follow-up blood cultures after the removal of the catheter [x]  []  DO NOT PLACE PICC UNTIL WE HAVE MINIMUM OF 72-98 hrs with NO GROWTH  Perform echocardiography to evaluate for endocarditis (transthoracic ECHO is 40-50% sensitive, TEE is > 90% sensitive) []  []  Please keep in mind, that neither test can definitively EXCLUDE endocarditis, and that should clinical suspicion remain high for endocarditis the patient should then still be treated with an "endocarditis" duration of therapy = 6 weeks   TEE TODAY  Consult electrophysiologist to evaluate implanted cardiac device (pacemaker, ICD) []  []  NA  Ensure  source control [x]  []  Have all abscesses been drained effectively? Have deep seeded infections (septic joints or osteomyelitis) had appropriate surgical debridement?  Source unclear, urine NOT typical, skin potentially a source but not in need of I and D  Investigate for "metastatic" sites of infection [x]  []  Does the patient have ANY symptom or physical exam finding that would suggest a deeper infection (back or neck pain that may be suggestive of vertebral osteomyelitis or epidural abscess, muscle pain that could be a symptom of pyomyositis)?  Keep in mind that for deep seeded infections MRI imaging with contrast is preferred rather than other often insensitive tests such as plain x-rays, especially early in a patient's presentation.  No other obvious sites of been identified  Change antibiotic therapy to cefazolin []  []  Beta-lactam antibiotics are preferred for MSSA due to higher cure rates.   If on Vancomycin, goal trough should be 15 - 20 mcg/mL  Estimated duration of IV antibiotic therapy: 6 weeks if endocarditis, 4 weeks if no endocarditis [x]  []  Consult case management for probably prolonged outpatient IV antibiotic therapy      LOS: 3 days   Alcide Evener 05/09/2017, 12:44 PM

## 2017-05-09 NOTE — Anesthesia Preprocedure Evaluation (Signed)
Anesthesia Evaluation  Patient identified by MRN, date of birth, ID band Patient awake    Reviewed: Allergy & Precautions, NPO status , Patient's Chart, lab work & pertinent test results, reviewed documented beta blocker date and time   History of Anesthesia Complications (+) PONV and history of anesthetic complications  Airway Mallampati: III       Dental no notable dental hx. (+) Teeth Intact   Pulmonary sleep apnea and Continuous Positive Airway Pressure Ventilation , pneumonia,    Pulmonary exam normal breath sounds clear to auscultation       Cardiovascular Exercise Tolerance: Poor hypertension, Pt. on medications and Pt. on home beta blockers + DOE  Normal cardiovascular exam Rhythm:Regular Rate:Normal     Neuro/Psych  Headaches, Seizures -, Well Controlled,  PSYCHIATRIC DISORDERS Anxiety Depression Complex partial seizures    GI/Hepatic Neg liver ROS, GERD  Medicated and Controlled,Crohn's disease   Endo/Other  diabetes, Well Controlled, Type 2, Oral Hypoglycemic AgentsHyperlipidemia  Renal/GU Hx/o renal calculi  negative genitourinary   Musculoskeletal  (+) Arthritis , Osteoarthritis,  Umbilical hernia Osteoporosis   Abdominal (+) + obese,   Peds  Hematology Thrombocytopenia   Anesthesia Other Findings   Reproductive/Obstetrics                               Chemistry      Component Value Date/Time   NA 138 05/07/2017 0635   NA 143 02/11/2017 1229   K 3.5 05/07/2017 0635   CL 108 05/07/2017 0635   CO2 23 05/07/2017 0635   BUN 13 05/07/2017 0635   BUN 12 02/11/2017 1229   CREATININE 0.80 05/07/2017 0635   CREATININE 0.84 05/15/2016 1005      Component Value Date/Time   CALCIUM 8.3 (L) 05/07/2017 0635   ALKPHOS 130 (H) 05/07/2017 0635   AST 22 05/07/2017 0635   ALT 18 05/07/2017 0635   BILITOT 2.9 (H) 05/07/2017 0635   BILITOT 3.4 (H) 02/11/2017 1229     Lab Results   Component Value Date   WBC 4.7 05/07/2017   HGB 10.6 (L) 05/07/2017   HCT 30.0 (L) 05/07/2017   MCV 106.0 (H) 05/07/2017   PLT 108 (L) 05/07/2017   EKG: normal sinus rhythm, LAE. Nuclear stress test 03/17/2013: Normal stress nuclear study. Anesthesia Physical  Anesthesia Plan  ASA: III  Anesthesia Plan: MAC   Post-op Pain Management:    Induction: Intravenous  PONV Risk Score and Plan: 2 and Treatment may vary due to age or medical condition  Airway Management Planned: Nasal Cannula  Additional Equipment:   Intra-op Plan:   Post-operative Plan:   Informed Consent: I have reviewed the patients History and Physical, chart, labs and discussed the procedure including the risks, benefits and alternatives for the proposed anesthesia with the patient or authorized representative who has indicated his/her understanding and acceptance.   Dental advisory given  Plan Discussed with: Anesthesiologist, CRNA and Surgeon  Anesthesia Plan Comments:         Anesthesia Quick Evaluation

## 2017-05-09 NOTE — Progress Notes (Signed)
PROGRESS NOTE    [name] [name]  TSV:[id number] DOB: 12/24/1960 DOA: 05/06/2017 PCP: Wardell Honour, MD    Brief Narrative:  [name] [name] is a 56 y.o. male with medical history significant of Crohn's disease involving small and large intestines, seizure disorder, arthritis, diabetes, depression, hypertension, hyperlipidemia who was recently seen at [facility] for abdominal pains.  Patient underwent CT abdomen pelvis which had findings concerning for constipation.  Patient was given a bowel regimen with market improvement of his abdominal discomfort.  An incidental 2.3 x 2.3 cm lower pole lesion of decreased attenuation was noted.  Recommendations were for pre-and postcontrast MRI or CT.  During that visit, patient was found to have a urinalysis suggestive of a urinary tract infection with pan cultures obtained.  Patient was discharged from the emergency department with levofloxacin.  On the day of hospital admission, patient was informed that his blood culture was positive for staph aureus and to present to the emergency department.  ED Course: The emergency department, patient was found to have a white blood count of 6.2.  Patient was afebrile.  Repeat blood cultures were obtained, currently pending.  ED physician has started the patient on empiric Ancef after discussion with pharmacy.  Infectious disease was consulted.  Hospitalist service was consulted for consideration for admission  Patient was found to have MSSA bacteremia and infectious disease was consulted. Patient underwent TTE on 05/07/2017.  He was scheduled for TEE on 05/09/2017   Assessment & Plan:   Principal Problem:   Bacteremia due to Staphylococcus aureus Active Problems:   Essential hypertension   CROHN'S DISEASE, LARGE AND SMALL INTESTINES   DM2 (diabetes mellitus, type 2) (HCC)   OSA on CPAP   Staphylococcus aureus bacteremia   Staph aureus UTI and bacteremia - Seen recently for abd pain and treated for  constipation - Patient found to have staph UTI and later bacteremia -Continue Ancef -Infectious disease consulted for further recommendations -TTE performed yesterday negative for endocarditis -Infectious disease to schedule TEE with cardiology at earliest convenience -TEE scheduled for today -Order PICC line for later this evening  HTN - BP stable - Cont monitor - Cont home regimen  Crohn's disease -Patient reports he has not had a bowel movement since time of admission -Will encourage patient to take MiraLAX given history of Crohn's disease  Squamous cell carcinoma of the skin -Continue fluorouracil cream -Restart mercaptopurine and folic acid  DM2 - Will continue on SSI coverage - CBG's well controlled  OSA on CPAP - Will cont on CPAP at night - Stable  DVT prophylaxis: Lovenox subQ  Code Status: Full Family Communication:  His wife is bedside Disposition Plan:  Patient will have PICC line placed today after TEE and will be ready for discharge tomorrow     Consultants:   Infectious Disease  Procedures:   TTE on 05/08/2017  TEE on 05/09/2017  Antimicrobials:   Ancef    Subjective: Patient seen during bedside rounds.  He is hopeful to go home tomorrow if everything can be aligned in terms of his IV outpatient antibiotics and his PICC line placement.  He states he supposed to undergo his TEE this afternoon.  Only concern is of right rib pain at site of fractures.  Objective: Vitals:   05/08/17 0753 05/08/17 1356 05/08/17 2115 05/09/17 0524  BP: 112/67 113/68 128/62 113/70  Pulse: 69 77 84 69  Resp: 18 18 17 18   Temp: 97.8 F (36.6 C) 97.6 F (36.4 C)  98.5 F (36.9 C) 97.7 F (36.5 C)  TempSrc: Oral Oral Oral Axillary  SpO2: 96% 99% 98% 96%  Weight: 91.7 kg (202 lb 2.6 oz)     Height: 5' 11"  (1.803 m)       Intake/Output Summary (Last 24 hours) at 05/09/2017 0758 Last data filed at 05/09/2017 1941 Gross per 24 hour  Intake 680 ml  Output  700 ml  Net -20 ml   Filed Weights   05/06/17 1056 05/08/17 0753  Weight: 90.7 kg (200 lb) 91.7 kg (202 lb 2.6 oz)    Examination:  General exam: Appears calm and comfortable  Respiratory system: Clear to auscultation. Respiratory effort normal. Cardiovascular system: S1 & S2 heard, RRR. No JVD, murmurs, rubs, gallops or clicks. No pedal edema. Gastrointestinal system: Abdomen is nondistended, soft and nontender. No organomegaly or masses felt. Normal bowel sounds heard. Central nervous system: Alert and oriented. No focal neurological deficits. Extremities: Symmetric 5 x 5 power. Skin: Lesions noted on arms bilaterally with no active bleeding Psychiatry: Judgement and insight appear normal. Mood & affect appropriate.     Data Reviewed: I have personally reviewed following labs and imaging studies  CBC: Recent Labs  Lab 05/05/17 0821 05/06/17 1140 05/07/17 0635  WBC 8.7 6.2 4.7  NEUTROABS 7.2 4.9  --   HGB 11.5* 11.4* 10.6*  HCT 32.7* 32.0* 30.0*  MCV 108.3* 106.7* 106.0*  PLT 105* 107* 740*   Basic Metabolic Panel: Recent Labs  Lab 05/05/17 0821 05/06/17 1140 05/07/17 0635  NA 136 138 138  K 3.7 3.9 3.5  CL 108 109 108  CO2 22 23 23   GLUCOSE 107* 112* 91  BUN 16 21* 13  CREATININE 0.69 0.93 0.80  CALCIUM 8.7* 8.7* 8.3*   GFR: Estimated Creatinine Clearance: 120.9 mL/min (by C-G formula based on SCr of 0.8 mg/dL). Liver Function Tests: Recent Labs  Lab 05/05/17 0821 05/06/17 1140 05/07/17 0635  AST 27 24 22   ALT 25 21 18   ALKPHOS 134* 149* 130*  BILITOT 5.0* 3.4* 2.9*  PROT 6.7 6.8 6.4*  ALBUMIN 3.8 3.7 3.4*   Recent Labs  Lab 05/05/17 0821  LIPASE 17   No results for input(s): AMMONIA in the last 168 hours. Coagulation Profile: Recent Labs  Lab 05/05/17 0821  INR 1.32   Cardiac Enzymes: No results for input(s): CKTOTAL, CKMB, CKMBINDEX, TROPONINI in the last 168 hours. BNP (last 3 results) No results for input(s): PROBNP in the last  8760 hours. HbA1C: No results for input(s): HGBA1C in the last 72 hours. CBG: Recent Labs  Lab 05/07/17 2122 05/08/17 0738 05/08/17 1115 05/08/17 1541 05/08/17 2101  GLUCAP 79 86 138* 129* 149*   Lipid Profile: No results for input(s): CHOL, HDL, LDLCALC, TRIG, CHOLHDL, LDLDIRECT in the last 72 hours. Thyroid Function Tests: No results for input(s): TSH, T4TOTAL, FREET4, T3FREE, THYROIDAB in the last 72 hours. Anemia Panel: No results for input(s): VITAMINB12, FOLATE, FERRITIN, TIBC, IRON, RETICCTPCT in the last 72 hours. Sepsis Labs: Recent Labs  Lab 05/05/17 8144  LATICACIDVEN 1.12    Recent Results (from the past 240 hour(s))  Blood culture (routine x 2)     Status: Abnormal   Collection Time: 05/05/17  8:20 AM  Result Value Ref Range Status   Specimen Description BLOOD RIGHT ARM  Final   Special Requests   Final    BOTTLES DRAWN AEROBIC AND ANAEROBIC Blood Culture adequate volume   Culture  Setup Time   Final    GRAM  POSITIVE COCCI AEROBIC BOTTLE ONLY CRITICAL VALUE NOTED.  VALUE IS CONSISTENT WITH PREVIOUSLY REPORTED AND CALLED VALUE.    Culture (A)  Final    STAPHYLOCOCCUS AUREUS SUSCEPTIBILITIES PERFORMED ON PREVIOUS CULTURE WITHIN THE LAST 5 DAYS. Performed at [facility], [place] [address], [place], [place] [address]    Report Status 05/08/2017 FINAL  Final  Blood culture (routine x 2)     Status: Abnormal   Collection Time: 05/05/17  8:25 AM  Result Value Ref Range Status   Specimen Description BLOOD LEFT ARM  Final   Special Requests   Final    BOTTLES DRAWN AEROBIC AND ANAEROBIC Blood Culture adequate volume   Culture  Setup Time   Final    GRAM POSITIVE COCCI IN CLUSTERS IN BOTH AEROBIC AND ANAEROBIC BOTTLES CRITICAL RESULT CALLED TO, READ BACK BY AND VERIFIED WITH: MSIMMS,RN @0826  05/06/17 BY LHOWARD Performed at [facility], [place] [address], [place], [place] [address]    Culture STAPHYLOCOCCUS AUREUS (A)  Final   Report Status  05/08/2017 FINAL  Final   Organism ID, Bacteria STAPHYLOCOCCUS AUREUS  Final      Susceptibility   Staphylococcus aureus - MIC*    CIPROFLOXACIN <=0.5 SENSITIVE Sensitive     ERYTHROMYCIN <=0.25 SENSITIVE Sensitive     GENTAMICIN <=0.5 SENSITIVE Sensitive     OXACILLIN <=0.25 SENSITIVE Sensitive     TETRACYCLINE <=1 SENSITIVE Sensitive     VANCOMYCIN 1 SENSITIVE Sensitive     TRIMETH/SULFA <=10 SENSITIVE Sensitive     CLINDAMYCIN <=0.25 SENSITIVE Sensitive     RIFAMPIN <=0.5 SENSITIVE Sensitive     Inducible Clindamycin NEGATIVE Sensitive     * STAPHYLOCOCCUS AUREUS  Blood Culture ID Panel (Reflexed)     Status: Abnormal   Collection Time: 05/05/17  8:25 AM  Result Value Ref Range Status   Enterococcus species NOT DETECTED NOT DETECTED Final   Listeria monocytogenes NOT DETECTED NOT DETECTED Final   Staphylococcus species DETECTED (A) NOT DETECTED Final    Comment: CRITICAL RESULT CALLED TO, READ BACK BY AND VERIFIED WITH: MSIMMS,RN @0826  05/06/17 BY LHOWARD    Staphylococcus aureus DETECTED (A) NOT DETECTED Final    Comment: Methicillin (oxacillin) susceptible Staphylococcus aureus (MSSA). Preferred therapy is anti staphylococcal beta lactam antibiotic (Cefazolin or Nafcillin), unless clinically contraindicated. CRITICAL RESULT CALLED TO, READ BACK BY AND VERIFIED WITH: MSIMMS,RN @0826  05/06/17 BY LHOWARD    Methicillin resistance NOT DETECTED NOT DETECTED Final   Streptococcus species NOT DETECTED NOT DETECTED Final   Streptococcus agalactiae NOT DETECTED NOT DETECTED Final   Streptococcus pneumoniae NOT DETECTED NOT DETECTED Final   Streptococcus pyogenes NOT DETECTED NOT DETECTED Final   Acinetobacter baumannii NOT DETECTED NOT DETECTED Final   Enterobacteriaceae species NOT DETECTED NOT DETECTED Final   Enterobacter cloacae complex NOT DETECTED NOT DETECTED Final   Escherichia coli NOT DETECTED NOT DETECTED Final   Klebsiella oxytoca NOT DETECTED NOT DETECTED Final    Klebsiella pneumoniae NOT DETECTED NOT DETECTED Final   Proteus species NOT DETECTED NOT DETECTED Final   Serratia marcescens NOT DETECTED NOT DETECTED Final   Haemophilus influenzae NOT DETECTED NOT DETECTED Final   Neisseria meningitidis NOT DETECTED NOT DETECTED Final   Pseudomonas aeruginosa NOT DETECTED NOT DETECTED Final   Candida albicans NOT DETECTED NOT DETECTED Final   Candida glabrata NOT DETECTED NOT DETECTED Final   Candida krusei NOT DETECTED NOT DETECTED Final   Candida parapsilosis NOT DETECTED NOT DETECTED Final   Candida tropicalis  NOT DETECTED NOT DETECTED Final    Comment: Performed at [facility], [place] [address], [place], [place] [address]  Urine culture     Status: Abnormal   Collection Time: 05/05/17  9:15 AM  Result Value Ref Range Status   Specimen Description URINE, CLEAN CATCH  Final   Special Requests NONE  Final   Culture >=100,000 COLONIES/mL STAPHYLOCOCCUS AUREUS (A)  Final   Report Status 05/07/2017 FINAL  Final   Organism ID, Bacteria STAPHYLOCOCCUS AUREUS (A)  Final      Susceptibility   Staphylococcus aureus - MIC*    CIPROFLOXACIN <=0.5 SENSITIVE Sensitive     GENTAMICIN <=0.5 SENSITIVE Sensitive     NITROFURANTOIN <=16 SENSITIVE Sensitive     OXACILLIN 0.5 SENSITIVE Sensitive     TETRACYCLINE <=1 SENSITIVE Sensitive     VANCOMYCIN 1 SENSITIVE Sensitive     TRIMETH/SULFA <=10 SENSITIVE Sensitive     CLINDAMYCIN <=0.25 SENSITIVE Sensitive     RIFAMPIN <=0.5 SENSITIVE Sensitive     Inducible Clindamycin NEGATIVE Sensitive     * >=100,000 COLONIES/mL STAPHYLOCOCCUS AUREUS  Culture, blood (routine x 2)     Status: None (Preliminary result)   Collection Time: 05/06/17 11:30 AM  Result Value Ref Range Status   Specimen Description BLOOD LEFT ANTECUBITAL  Final   Special Requests   Final    BOTTLES DRAWN AEROBIC AND ANAEROBIC Blood Culture adequate volume   Culture   Final    NO GROWTH 2 DAYS Performed at [facility], [address] [place], [place] [address]    Report Status PENDING  Incomplete  Culture, blood (routine x 2)     Status: None (Preliminary result)   Collection Time: 05/06/17 11:45 AM  Result Value Ref Range Status   Specimen Description BLOOD RIGHT ANTECUBITAL  Final   Special Requests   Final    BOTTLES DRAWN AEROBIC AND ANAEROBIC Blood Culture adequate volume   Culture   Final    NO GROWTH 2 DAYS Performed at [facility], [place] [address] [place], [place] [address]    Report Status PENDING  Incomplete  MRSA PCR Screening     Status: None   Collection Time: 05/06/17  4:43 PM  Result Value Ref Range Status   MRSA by PCR NEGATIVE NEGATIVE Final    Comment:        The GeneXpert MRSA Assay (FDA approved for NASAL specimens only), is one component of a comprehensive MRSA colonization surveillance program. It is not intended to diagnose MRSA infection nor to guide or monitor treatment for MRSA infections.   Culture, blood (Routine X 2) w Reflex to ID Panel     Status: None (Preliminary result)   Collection Time: 05/07/17  9:46 AM  Result Value Ref Range Status   Specimen Description BLOOD RIGHT ANTECUBITAL  Final   Special Requests   Final    BOTTLES DRAWN AEROBIC AND ANAEROBIC Blood Culture adequate volume   Culture   Final    NO GROWTH 1 DAY Performed at [facility], [place] [address] [place],  [address]    Report Status PENDING  Incomplete  Culture, blood (Routine X 2) w Reflex to ID Panel     Status: None (Preliminary result)   Collection Time: 05/07/17  9:53 AM  Result Value Ref Range Status   Specimen Description BLOOD RIGHT HAND  Final   Special Requests   Final    BOTTLES DRAWN AEROBIC AND  ANAEROBIC Blood Culture adequate volume   Culture   Final    NO GROWTH 1 DAY Performed at [facility], [place] [address], [place], [place] [address]    Report Status PENDING  Incomplete         Radiology Studies: No results found.      Scheduled Meds: .  aspirin EC  81 mg Oral QHS  . diltiazem  180 mg Oral QHS  . enoxaparin (LOVENOX) injection  40 mg Subcutaneous Q24H  . ferrous sulfate  325 mg Oral QHS  . fluorouracil  1 application Topical QHS  . folic acid  2 mg Oral Daily  . insulin aspart  0-15 Units Subcutaneous TID WC  . insulin aspart  0-5 Units Subcutaneous QHS  . lamoTRIgine  100 mg Oral BID  . mesalamine  1.2 g Oral BID  . methscopolamine  5 mg Oral BID  . pantoprazole  40 mg Oral Daily  . sertraline  200 mg Oral Q breakfast  . traZODone  50-100 mg Oral QHS   Continuous Infusions: .  ceFAZolin (ANCEF) IV 2 g (05/09/17 0558)     LOS: 3 days    Time spent: 25 minutes    Loretha Stapler, MD [facility] Pager [phone]  If 7PM-7AM, please contact night-coverage [email or web address] Password [email or web address] 05/09/2017, 7:58 AM

## 2017-05-09 NOTE — Progress Notes (Signed)
    [facility] has been requested to perform a transesophageal echocardiogram on [name] [name] for bacteremia.  After careful review of history and examination, the risks and benefits of transesophageal echocardiogram have been explained including risks of esophageal damage, perforation (1:10,000 risk), bleeding, pharyngeal hematoma as well as other potential complications associated with conscious sedation including aspiration, arrhythmia, respiratory failure and death. Alternatives to treatment were discussed, questions were answered. Patient is willing to proceed.   BP stable with no requirement for pressors. Hgb stable at 10.6 with platelets of 108. TEE is scheduled for today at 1400 with Dr. Marlou Porch.   Erma Heritage, PA-C  05/09/2017 9:44 AM

## 2017-05-09 NOTE — Progress Notes (Signed)
Subjective: No new complaints   Antibiotics:  Anti-infectives (From admission, onward)   Start     Dose/Rate Route Frequency Ordered Stop   05/06/17 2000  ceFAZolin (ANCEF) IVPB 2g/100 mL premix     2 g 200 mL/hr over 30 Minutes Intravenous Every 8 hours 05/06/17 1155     05/06/17 1230  ceFAZolin (ANCEF) IVPB 2g/100 mL premix     2 g 200 mL/hr over 30 Minutes Intravenous NOW 05/06/17 1155 05/06/17 1426      Medications: Scheduled Meds: . aspirin EC  81 mg Oral QHS  . diltiazem  180 mg Oral QHS  . enoxaparin (LOVENOX) injection  40 mg Subcutaneous Q24H  . ferrous sulfate  325 mg Oral QHS  . fluorouracil  1 application Topical QHS  . folic acid  2 mg Oral Daily  . insulin aspart  0-15 Units Subcutaneous TID WC  . insulin aspart  0-5 Units Subcutaneous QHS  . lamoTRIgine  100 mg Oral BID  . mesalamine  1.2 g Oral BID  . methscopolamine  5 mg Oral BID  . pantoprazole  40 mg Oral Daily  . sertraline  200 mg Oral Q breakfast  . traZODone  50-100 mg Oral QHS   Continuous Infusions: .  ceFAZolin (ANCEF) IV 2 g (05/09/17 0558)   PRN Meds:.acetaminophen **OR** acetaminophen, ketorolac, polyethylene glycol, zolpidem    Objective: Weight change:   Intake/Output Summary (Last 24 hours) at 05/09/2017 1244 Last data filed at 05/09/2017 0611 Gross per 24 hour  Intake 680 ml  Output 700 ml  Net -20 ml   Blood pressure 113/70, pulse 69, temperature 97.7 F (36.5 C), temperature source Axillary, resp. rate 18, height 5' 11"  (1.803 m), weight 202 lb 2.6 oz (91.7 kg), SpO2 96 %. Temp:  [97.6 F (36.4 C)-98.5 F (36.9 C)] 97.7 F (36.5 C) (11/16 0524) Pulse Rate:  [69-84] 69 (11/16 0524) Resp:  [17-18] 18 (11/16 0524) BP: (113-128)/(62-70) 113/70 (11/16 0524) SpO2:  [96 %-99 %] 96 % (11/16 0524)  Physical Exam: General: Alert and awake, oriented x3, not in any acute distress. HEENT: anicteric sclera,, EOMI CV: rrr no mgr Pulm: CTAB GI: Soft, ND,  nT  05/07/17:          Neuro: nonfocal  CBC: CBC Latest Ref Rng & Units 05/07/2017 05/06/2017 05/05/2017  WBC 4.0 - 10.5 K/uL 4.7 6.2 8.7  Hemoglobin 13.0 - 17.0 g/dL 10.6(L) 11.4(L) 11.5(L)  Hematocrit 39.0 - 52.0 % 30.0(L) 32.0(L) 32.7(L)  Platelets 150 - 400 K/uL 108(L) 107(L) 105(L)      BMET Recent Labs    05/07/17 0635  NA 138  K 3.5  CL 108  CO2 23  GLUCOSE 91  BUN 13  CREATININE 0.80  CALCIUM 8.3*     Liver Panel  Recent Labs    05/07/17 0635  PROT 6.4*  ALBUMIN 3.4*  AST 22  ALT 18  ALKPHOS 130*  BILITOT 2.9*       Sedimentation Rate No results for input(s): ESRSEDRATE in the last 72 hours. C-Reactive Protein No results for input(s): CRP in the last 72 hours.  Micro Results: Recent Results (from the past 720 hour(s))  Blood culture (routine x 2)     Status: Abnormal   Collection Time: 05/05/17  8:20 AM  Result Value Ref Range Status   Specimen Description BLOOD RIGHT ARM  Final   Special Requests   Final    BOTTLES DRAWN AEROBIC AND ANAEROBIC  Blood Culture adequate volume   Culture  Setup Time   Final    GRAM POSITIVE COCCI AEROBIC BOTTLE ONLY CRITICAL VALUE NOTED.  VALUE IS CONSISTENT WITH PREVIOUSLY REPORTED AND CALLED VALUE.    Culture (A)  Final    STAPHYLOCOCCUS AUREUS SUSCEPTIBILITIES PERFORMED ON PREVIOUS CULTURE WITHIN THE LAST 5 DAYS. Performed at [facility], [place] [address], [place], [place] [address]    Report Status 05/08/2017 FINAL  Final  Blood culture (routine x 2)     Status: Abnormal   Collection Time: 05/05/17  8:25 AM  Result Value Ref Range Status   Specimen Description BLOOD LEFT ARM  Final   Special Requests   Final    BOTTLES DRAWN AEROBIC AND ANAEROBIC Blood Culture adequate volume   Culture  Setup Time   Final    GRAM POSITIVE COCCI IN CLUSTERS IN BOTH AEROBIC AND ANAEROBIC BOTTLES CRITICAL RESULT CALLED TO, READ BACK BY AND VERIFIED WITH: MSIMMS,RN @0826  05/06/17 BY LHOWARD Performed at  [facility], [place] [address], [place], [place] [address]    Culture STAPHYLOCOCCUS AUREUS (A)  Final   Report Status 05/08/2017 FINAL  Final   Organism ID, Bacteria STAPHYLOCOCCUS AUREUS  Final      Susceptibility   Staphylococcus aureus - MIC*    CIPROFLOXACIN <=0.5 SENSITIVE Sensitive     ERYTHROMYCIN <=0.25 SENSITIVE Sensitive     GENTAMICIN <=0.5 SENSITIVE Sensitive     OXACILLIN <=0.25 SENSITIVE Sensitive     TETRACYCLINE <=1 SENSITIVE Sensitive     VANCOMYCIN 1 SENSITIVE Sensitive     TRIMETH/SULFA <=10 SENSITIVE Sensitive     CLINDAMYCIN <=0.25 SENSITIVE Sensitive     RIFAMPIN <=0.5 SENSITIVE Sensitive     Inducible Clindamycin NEGATIVE Sensitive     * STAPHYLOCOCCUS AUREUS  Blood Culture ID Panel (Reflexed)     Status: Abnormal   Collection Time: 05/05/17  8:25 AM  Result Value Ref Range Status   Enterococcus species NOT DETECTED NOT DETECTED Final   Listeria monocytogenes NOT DETECTED NOT DETECTED Final   Staphylococcus species DETECTED (A) NOT DETECTED Final    Comment: CRITICAL RESULT CALLED TO, READ BACK BY AND VERIFIED WITH: MSIMMS,RN @0826  05/06/17 BY LHOWARD    Staphylococcus aureus DETECTED (A) NOT DETECTED Final    Comment: Methicillin (oxacillin) susceptible Staphylococcus aureus (MSSA). Preferred therapy is anti staphylococcal beta lactam antibiotic (Cefazolin or Nafcillin), unless clinically contraindicated. CRITICAL RESULT CALLED TO, READ BACK BY AND VERIFIED WITH: MSIMMS,RN @0826  05/06/17 BY LHOWARD    Methicillin resistance NOT DETECTED NOT DETECTED Final   Streptococcus species NOT DETECTED NOT DETECTED Final   Streptococcus agalactiae NOT DETECTED NOT DETECTED Final   Streptococcus pneumoniae NOT DETECTED NOT DETECTED Final   Streptococcus pyogenes NOT DETECTED NOT DETECTED Final   Acinetobacter baumannii NOT DETECTED NOT DETECTED Final   Enterobacteriaceae species NOT DETECTED NOT DETECTED Final   Enterobacter cloacae complex NOT DETECTED NOT  DETECTED Final   Escherichia coli NOT DETECTED NOT DETECTED Final   Klebsiella oxytoca NOT DETECTED NOT DETECTED Final   Klebsiella pneumoniae NOT DETECTED NOT DETECTED Final   Proteus species NOT DETECTED NOT DETECTED Final   Serratia marcescens NOT DETECTED NOT DETECTED Final   Haemophilus influenzae NOT DETECTED NOT DETECTED Final   Neisseria meningitidis NOT DETECTED NOT DETECTED Final   Pseudomonas aeruginosa NOT DETECTED NOT DETECTED Final   Candida albicans NOT DETECTED NOT DETECTED Final   Candida glabrata NOT DETECTED NOT DETECTED Final   Candida krusei NOT  DETECTED NOT DETECTED Final   Candida parapsilosis NOT DETECTED NOT DETECTED Final   Candida tropicalis NOT DETECTED NOT DETECTED Final    Comment: Performed at [facility], [place] [address], [place], [place] [address]  Urine culture     Status: Abnormal   Collection Time: 05/05/17  9:15 AM  Result Value Ref Range Status   Specimen Description URINE, CLEAN CATCH  Final   Special Requests NONE  Final   Culture >=100,000 COLONIES/mL STAPHYLOCOCCUS AUREUS (A)  Final   Report Status 05/07/2017 FINAL  Final   Organism ID, Bacteria STAPHYLOCOCCUS AUREUS (A)  Final      Susceptibility   Staphylococcus aureus - MIC*    CIPROFLOXACIN <=0.5 SENSITIVE Sensitive     GENTAMICIN <=0.5 SENSITIVE Sensitive     NITROFURANTOIN <=16 SENSITIVE Sensitive     OXACILLIN 0.5 SENSITIVE Sensitive     TETRACYCLINE <=1 SENSITIVE Sensitive     VANCOMYCIN 1 SENSITIVE Sensitive     TRIMETH/SULFA <=10 SENSITIVE Sensitive     CLINDAMYCIN <=0.25 SENSITIVE Sensitive     RIFAMPIN <=0.5 SENSITIVE Sensitive     Inducible Clindamycin NEGATIVE Sensitive     * >=100,000 COLONIES/mL STAPHYLOCOCCUS AUREUS  Culture, blood (routine x 2)     Status: None (Preliminary result)   Collection Time: 05/06/17 11:30 AM  Result Value Ref Range Status   Specimen Description BLOOD LEFT ANTECUBITAL  Final   Special Requests   Final    BOTTLES DRAWN AEROBIC AND  ANAEROBIC Blood Culture adequate volume   Culture   Final    NO GROWTH 3 DAYS Performed at [facility], [address], [place], [place] [address]    Report Status PENDING  Incomplete  Culture, blood (routine x 2)     Status: None (Preliminary result)   Collection Time: 05/06/17 11:45 AM  Result Value Ref Range Status   Specimen Description BLOOD RIGHT ANTECUBITAL  Final   Special Requests   Final    BOTTLES DRAWN AEROBIC AND ANAEROBIC Blood Culture adequate volume   Culture   Final    NO GROWTH 3 DAYS Performed at [facility], [place] [address] [place], [place] [address]    Report Status PENDING  Incomplete  MRSA PCR Screening     Status: None   Collection Time: 05/06/17  4:43 PM  Result Value Ref Range Status   MRSA by PCR NEGATIVE NEGATIVE Final    Comment:        The GeneXpert MRSA Assay (FDA approved for NASAL specimens only), is one component of a comprehensive MRSA colonization surveillance program. It is not intended to diagnose MRSA infection nor to guide or monitor treatment for MRSA infections.   Culture, blood (Routine X 2) w Reflex to ID Panel     Status: None (Preliminary result)   Collection Time: 05/07/17  9:46 AM  Result Value Ref Range Status   Specimen Description BLOOD RIGHT ANTECUBITAL  Final   Special Requests   Final    BOTTLES DRAWN AEROBIC AND ANAEROBIC Blood Culture adequate volume   Culture   Final    NO GROWTH 2 DAYS Performed at [facility], [address] [place], [place] [address]    Report Status PENDING  Incomplete  Culture, blood (Routine X 2) w Reflex to ID Panel     Status: None (Preliminary result)   Collection Time: 05/07/17  9:53 AM  Result Value Ref Range Status   Specimen Description BLOOD RIGHT  HAND  Final   Special Requests   Final    BOTTLES DRAWN AEROBIC AND ANAEROBIC Blood Culture adequate volume   Culture   Final    NO GROWTH 2 DAYS Performed at [facility], [address], [place],  [place] [address]    Report Status PENDING  Incomplete    Studies/Results: No results found.    Assessment/Plan:  INTERVAL HISTORY:  TEE scheduled for today   Principal Problem:   Bacteremia due to Staphylococcus aureus Active Problems:   Essential hypertension   CROHN'S DISEASE, LARGE AND SMALL INTESTINES   DM2 (diabetes mellitus, type 2) (HCC)   OSA on CPAP   Staphylococcus aureus bacteremia    [name] [name] is a 56 y.o. male with  MSSA bacteremia without clear cut source, potentially from skin   #1.      [facility] Staphylococcus aureus bacteremia   Staphylococcus aureus bacteremia (SAB) is associated with a high rate of complications and mortality.  Specific aspects of clinical management are critical to optimizing the outcome of patients with SAB.  Therefore, the [facility] Antimicrobial Management Team [facility]) has initiated an intervention aimed at improving the management of SAB at [facility].  To do so, Infectious Diseases physicians are providing an evidence-based consult for the management of all patients with SAB.     Yes No Comments  Perform follow-up blood cultures (even if the patient is afebrile) to ensure clearance of bacteremia [x]  []  Repeat blood cultures on levaquin done and AFTER  Ancef NO GROWTH  Remove vascular catheter and obtain follow-up blood cultures after the removal of the catheter [x]  []  DO NOT PLACE PICC UNTIL WE HAVE MINIMUM OF 72-98 hrs with NO GROWTH  Perform echocardiography to evaluate for endocarditis (transthoracic ECHO is 40-50% sensitive, TEE is > 90% sensitive) []  []  Please keep in mind, that neither test can definitively EXCLUDE endocarditis, and that should clinical suspicion remain high for endocarditis the patient should then still be treated with an "endocarditis" duration of therapy = 6 weeks   TEE TODAY  Consult electrophysiologist to evaluate implanted cardiac device (pacemaker, ICD) []  []  NA  Ensure  source control [x]  []  Have all abscesses been drained effectively? Have deep seeded infections (septic joints or osteomyelitis) had appropriate surgical debridement?  Source unclear, urine NOT typical, skin potentially a source but not in need of I and D  Investigate for "metastatic" sites of infection [x]  []  Does the patient have ANY symptom or physical exam finding that would suggest a deeper infection (back or neck pain that may be suggestive of vertebral osteomyelitis or epidural abscess, muscle pain that could be a symptom of pyomyositis)?  Keep in mind that for deep seeded infections MRI imaging with contrast is preferred rather than other often insensitive tests such as plain x-rays, especially early in a patient's presentation.  No other obvious sites of been identified  Change antibiotic therapy to cefazolin []  []  Beta-lactam antibiotics are preferred for MSSA due to higher cure rates.   If on Vancomycin, goal trough should be 15 - 20 mcg/mL  Estimated duration of IV antibiotic therapy: 6 weeks if endocarditis, 4 weeks if no endocarditis [x]  []  Consult case management for probably prolonged outpatient IV antibiotic therapy      LOS: 3 days   Alcide Evener 05/09/2017, 12:44 PM

## 2017-05-09 NOTE — Anesthesia Procedure Notes (Signed)
Procedure Name: MAC Date/Time: 05/09/2017 3:01 PM Performed by: Candis Shine, CRNA Pre-anesthesia Checklist: Patient identified, Emergency Drugs available, Suction available, Patient being monitored and Timeout performed Patient Re-evaluated:Patient Re-evaluated prior to induction Oxygen Delivery Method: Nasal cannula Dental Injury: Teeth and Oropharynx as per pre-operative assessment

## 2017-05-09 NOTE — Transfer of Care (Signed)
Immediate Anesthesia Transfer of Care Note  Patient: [name] [name]  Procedure(s) Performed: TRANSESOPHAGEAL ECHOCARDIOGRAM (TEE) (N/A )  Patient Location: Endoscopy Unit  Anesthesia Type:MAC  Level of Consciousness: awake, alert  and oriented  Airway & Oxygen Therapy: Patient Spontanous Breathing and Patient connected to nasal cannula oxygen  Post-op Assessment: Report given to RN and Post -op Vital signs reviewed and stable  Post vital signs: Reviewed and stable  Last Vitals:  Vitals:   05/09/17 0524 05/09/17 1451  BP: 113/70 138/71  Pulse: 69   Resp: 18 (!) 23  Temp: 36.5 C 36.8 C  SpO2: 96% 97%    Last Pain:  Vitals:   05/09/17 1451  TempSrc: Oral  PainSc:          Complications: No apparent anesthesia complications

## 2017-05-09 NOTE — Anesthesia Postprocedure Evaluation (Signed)
Anesthesia Post Note  Patient: [name] [name]  Procedure(s) Performed: TRANSESOPHAGEAL ECHOCARDIOGRAM (TEE) (N/A )     Patient location during evaluation: PACU Anesthesia Type: MAC Level of consciousness: awake and alert Pain management: pain level controlled Vital Signs Assessment: post-procedure vital signs reviewed and stable Respiratory status: spontaneous breathing, nonlabored ventilation and respiratory function stable Cardiovascular status: stable and blood pressure returned to baseline Postop Assessment: no apparent nausea or vomiting Anesthetic complications: no    Last Vitals:  Vitals:   05/09/17 1451 05/09/17 1525  BP: 138/71 (!) 95/48  Pulse:  62  Resp: (!) 23 16  Temp: 36.8 C 36.6 C  SpO2: 97% 98%    Last Pain:  Vitals:   05/09/17 1525  TempSrc: Oral  PainSc:                  Lynda Rainwater

## 2017-05-09 NOTE — Progress Notes (Signed)
  Echocardiogram Echocardiogram Transesophageal has been performed.  [name] [name] F 05/09/2017, 3:52 PM

## 2017-05-09 NOTE — Interval H&P Note (Signed)
History and Physical Interval Note:  05/09/2017 2:51 PM  [name] [name]  has presented today for surgery, with the diagnosis of BACTEREMIA  The various methods of treatment have been discussed with the patient and family. After consideration of risks, benefits and other options for treatment, the patient has consented to  Procedure(s): TRANSESOPHAGEAL ECHOCARDIOGRAM (TEE) (N/A) as a surgical intervention .  The patient's history has been reviewed, patient examined, no change in status, stable for surgery.  I have reviewed the patient's chart and labs.  Questions were answered to the patient's satisfaction.     [employer]

## 2017-05-09 NOTE — Telephone Encounter (Signed)
Pt with + BC from ED 110/1/18 [place] admitted. No action required per Aneta Mins Pharm D

## 2017-05-10 LAB — GLUCOSE, CAPILLARY
Glucose-Capillary: 100 mg/dL — ABNORMAL HIGH (ref 65–99)
Glucose-Capillary: 80 mg/dL (ref 65–99)
Glucose-Capillary: 122 mg/dL — ABNORMAL HIGH (ref 65–99)
Glucose-Capillary: 117 mg/dL — ABNORMAL HIGH (ref 65–99)

## 2017-05-10 MED ORDER — CEFAZOLIN IV (FOR PTA / DISCHARGE USE ONLY): 2.0000 g | Freq: Three times a day (TID) | INTRAVENOUS | 0 refills | Status: AC

## 2017-05-10 NOTE — Progress Notes (Signed)
Patient Demographics:    [name] [name], is a 56 y.o. male, DOB - 05/06/1961, MBT:[id number]  Admit date - 05/06/2017   Admitting Physician Donne Hazel, MD  Outpatient Primary MD for the patient is Wardell Honour, MD  LOS - 4   Chief Complaint  Patient presents with  . staph in urine        Subjective:    [name] [name] today has no fevers, no emesis,  No chest pain,  No new concerns   Assessment  & Plan :    Principal Problem:   Bacteremia due to Staphylococcus aureus Active Problems:   Essential hypertension   CROHN'S DISEASE, LARGE AND SMALL INTESTINES   DM2 (diabetes mellitus, type 2) (HCC)   OSA on CPAP   Staphylococcus aureus bacteremia  TEE on 05/09/17 No vegetations, no signs of endocarditis -Normal ejection fraction 60% - Mild mitral regurgitation - Small patent foramen ovale, PFO with right to left shunt, small amount of bubble crossover noted with Valsalva maneuver.  This should be of no clinical consequence.   Plan:- 1)MSSA Bacteremia- TEE on 05/09/2017 without evidence of endocarditis,, continue IV Ancef 2 g IV every 8 hours via Rt arm Midline catheter for 4 weeks last dose 06/02/2017.  Follow-up in infectious disease as outpatient, home health RN to do weekly CBC and BMP while on IV antibiotics.  Discharge home with [facility] services on 05/11/17 , home health agency unable to provide services until 05/11/2017  2) Crohn's colitis-continue mesalamine and folic acid  3)DM-Use Novolog/Humalog Sliding scale insulin with Accu-Cheks/Fingersticks as ordered    4)OSA-CPAP nightly  Code Status : Full code   Disposition Plan  : home with [facility] services on 05/11/17   Consults  : ID   DVT Prophylaxis  :  Lovenox   Lab Results  Component Value Date   PLT 108 (L) 05/07/2017    Inpatient Medications  Scheduled Meds: . aspirin EC  81 mg Oral QHS  . diltiazem  180 mg Oral QHS  .  enoxaparin (LOVENOX) injection  40 mg Subcutaneous Q24H  . ferrous sulfate  325 mg Oral QHS  . fluorouracil  1 application Topical QHS  . folic acid  2 mg Oral Daily  . insulin aspart  0-15 Units Subcutaneous TID WC  . insulin aspart  0-5 Units Subcutaneous QHS  . lamoTRIgine  100 mg Oral BID  . mesalamine  1.2 g Oral BID  . methscopolamine  5 mg Oral BID  . pantoprazole  40 mg Oral Daily  . sertraline  200 mg Oral Q breakfast  . traZODone  50-100 mg Oral QHS   Continuous Infusions: .  ceFAZolin (ANCEF) IV Stopped (05/10/17 1406)   PRN Meds:.acetaminophen **OR** acetaminophen, ketorolac, polyethylene glycol, zolpidem    Anti-infectives (From admission, onward)   Start     Dose/Rate [name] Frequency Ordered Stop   05/10/17 0000  ceFAZolin (ANCEF) IVPB     2 g Intravenous Every 8 hours 05/10/17 1542 06/02/17 2359   05/06/17 2000  ceFAZolin (ANCEF) IVPB 2g/100 mL premix     2 g 200 mL/hr over 30 Minutes Intravenous Every 8 hours 05/06/17 1155     05/06/17 1230  ceFAZolin (ANCEF) IVPB 2g/100 mL premix  2 g 200 mL/hr over 30 Minutes Intravenous NOW 05/06/17 1155 05/06/17 1426        Objective:   Vitals:   05/09/17 2042 05/10/17 0542 05/10/17 0920 05/10/17 2011  BP: 118/72 134/79 109/65 135/73  Pulse: 73 73 68 75  Resp: 18 18 18 18   Temp: 98.1 F (36.7 C) 98 F (36.7 C) 98 F (36.7 C) 98.2 F (36.8 C)  TempSrc: Oral Oral Oral Oral  SpO2: 98% 100% 97% 98%  Weight:      Height:        Wt Readings from Last 3 Encounters:  05/09/17 91.6 kg (202 lb)  05/05/17 90.7 kg (200 lb)  04/02/17 94.8 kg (209 lb)     Intake/Output Summary (Last 24 hours) at 05/10/2017 2031 Last data filed at 05/10/2017 1805 Gross per 24 hour  Intake 700 ml  Output 200 ml  Net 500 ml     Physical Exam  Gen:- Awake Alert,  In no apparent distress  HEENT:- [place].AT, No sclera icterus Neck-Supple Neck,No JVD,.  Lungs-  CTAB  CV- S1, S2 normal Abd-  +ve B.Sounds, Abd Soft, No tenderness,     Extremity/Skin:- No  edema,  Rt arm Midline catheter    Data Review:   Micro Results Recent Results (from the past 240 hour(s))  Blood culture (routine x 2)     Status: Abnormal   Collection Time: 05/05/17  8:20 AM  Result Value Ref Range Status   Specimen Description BLOOD RIGHT ARM  Final   Special Requests   Final    BOTTLES DRAWN AEROBIC AND ANAEROBIC Blood Culture adequate volume   Culture  Setup Time   Final    GRAM POSITIVE COCCI AEROBIC BOTTLE ONLY CRITICAL VALUE NOTED.  VALUE IS CONSISTENT WITH PREVIOUSLY REPORTED AND CALLED VALUE.    Culture (A)  Final    STAPHYLOCOCCUS AUREUS SUSCEPTIBILITIES PERFORMED ON PREVIOUS CULTURE WITHIN THE LAST 5 DAYS. Performed at [facility], [place] [address] [place], [place] [address]    Report Status 05/08/2017 FINAL  Final  Blood culture (routine x 2)     Status: Abnormal   Collection Time: 05/05/17  8:25 AM  Result Value Ref Range Status   Specimen Description BLOOD LEFT ARM  Final   Special Requests   Final    BOTTLES DRAWN AEROBIC AND ANAEROBIC Blood Culture adequate volume   Culture  Setup Time   Final    GRAM POSITIVE COCCI IN CLUSTERS IN BOTH AEROBIC AND ANAEROBIC BOTTLES CRITICAL RESULT CALLED TO, READ BACK BY AND VERIFIED WITH: MSIMMS,RN @0826  05/06/17 BY LHOWARD Performed at [facility], [place] [address], [place], [place] [address]    Culture STAPHYLOCOCCUS AUREUS (A)  Final   Report Status 05/08/2017 FINAL  Final   Organism ID, Bacteria STAPHYLOCOCCUS AUREUS  Final      Susceptibility   Staphylococcus aureus - MIC*    CIPROFLOXACIN <=0.5 SENSITIVE Sensitive     ERYTHROMYCIN <=0.25 SENSITIVE Sensitive     GENTAMICIN <=0.5 SENSITIVE Sensitive     OXACILLIN <=0.25 SENSITIVE Sensitive     TETRACYCLINE <=1 SENSITIVE Sensitive     VANCOMYCIN 1 SENSITIVE Sensitive     TRIMETH/SULFA <=10 SENSITIVE Sensitive     CLINDAMYCIN <=0.25 SENSITIVE Sensitive     RIFAMPIN <=0.5 SENSITIVE Sensitive     Inducible  Clindamycin NEGATIVE Sensitive     * STAPHYLOCOCCUS AUREUS  Blood Culture ID Panel (Reflexed)     Status: Abnormal   Collection Time:  05/05/17  8:25 AM  Result Value Ref Range Status   Enterococcus species NOT DETECTED NOT DETECTED Final   Listeria monocytogenes NOT DETECTED NOT DETECTED Final   Staphylococcus species DETECTED (A) NOT DETECTED Final    Comment: CRITICAL RESULT CALLED TO, READ BACK BY AND VERIFIED WITH: MSIMMS,RN @0826  05/06/17 BY LHOWARD    Staphylococcus aureus DETECTED (A) NOT DETECTED Final    Comment: Methicillin (oxacillin) susceptible Staphylococcus aureus (MSSA). Preferred therapy is anti staphylococcal beta lactam antibiotic (Cefazolin or Nafcillin), unless clinically contraindicated. CRITICAL RESULT CALLED TO, READ BACK BY AND VERIFIED WITH: MSIMMS,RN @0826  05/06/17 BY LHOWARD    Methicillin resistance NOT DETECTED NOT DETECTED Final   Streptococcus species NOT DETECTED NOT DETECTED Final   Streptococcus agalactiae NOT DETECTED NOT DETECTED Final   Streptococcus pneumoniae NOT DETECTED NOT DETECTED Final   Streptococcus pyogenes NOT DETECTED NOT DETECTED Final   Acinetobacter baumannii NOT DETECTED NOT DETECTED Final   Enterobacteriaceae species NOT DETECTED NOT DETECTED Final   Enterobacter cloacae complex NOT DETECTED NOT DETECTED Final   Escherichia coli NOT DETECTED NOT DETECTED Final   Klebsiella oxytoca NOT DETECTED NOT DETECTED Final   Klebsiella pneumoniae NOT DETECTED NOT DETECTED Final   Proteus species NOT DETECTED NOT DETECTED Final   Serratia marcescens NOT DETECTED NOT DETECTED Final   Haemophilus influenzae NOT DETECTED NOT DETECTED Final   Neisseria meningitidis NOT DETECTED NOT DETECTED Final   Pseudomonas aeruginosa NOT DETECTED NOT DETECTED Final   Candida albicans NOT DETECTED NOT DETECTED Final   Candida glabrata NOT DETECTED NOT DETECTED Final   Candida krusei NOT DETECTED NOT DETECTED Final   Candida parapsilosis NOT DETECTED NOT  DETECTED Final   Candida tropicalis NOT DETECTED NOT DETECTED Final    Comment: Performed at [facility], [place] [address] [place], [place] [address]  Urine culture     Status: Abnormal   Collection Time: 05/05/17  9:15 AM  Result Value Ref Range Status   Specimen Description URINE, CLEAN CATCH  Final   Special Requests NONE  Final   Culture >=100,000 COLONIES/mL STAPHYLOCOCCUS AUREUS (A)  Final   Report Status 05/07/2017 FINAL  Final   Organism ID, Bacteria STAPHYLOCOCCUS AUREUS (A)  Final      Susceptibility   Staphylococcus aureus - MIC*    CIPROFLOXACIN <=0.5 SENSITIVE Sensitive     GENTAMICIN <=0.5 SENSITIVE Sensitive     NITROFURANTOIN <=16 SENSITIVE Sensitive     OXACILLIN 0.5 SENSITIVE Sensitive     TETRACYCLINE <=1 SENSITIVE Sensitive     VANCOMYCIN 1 SENSITIVE Sensitive     TRIMETH/SULFA <=10 SENSITIVE Sensitive     CLINDAMYCIN <=0.25 SENSITIVE Sensitive     RIFAMPIN <=0.5 SENSITIVE Sensitive     Inducible Clindamycin NEGATIVE Sensitive     * >=100,000 COLONIES/mL STAPHYLOCOCCUS AUREUS  Culture, blood (routine x 2)     Status: None (Preliminary result)   Collection Time: 05/06/17 11:30 AM  Result Value Ref Range Status   Specimen Description BLOOD LEFT ANTECUBITAL  Final   Special Requests   Final    BOTTLES DRAWN AEROBIC AND ANAEROBIC Blood Culture adequate volume   Culture   Final    NO GROWTH 4 DAYS Performed at [facility], [address] [place], [place] [address]    Report Status PENDING  Incomplete  Culture, blood (routine x 2)     Status: None (Preliminary result)   Collection Time: 05/06/17 11:45 AM  Result Value Ref Range Status   Specimen  Description BLOOD RIGHT ANTECUBITAL  Final   Special Requests   Final    BOTTLES DRAWN AEROBIC AND ANAEROBIC Blood Culture adequate volume   Culture   Final    NO GROWTH 4 DAYS Performed at [facility], [address], [place], [place] [address]    Report Status PENDING  Incomplete  MRSA PCR  Screening     Status: None   Collection Time: 05/06/17  4:43 PM  Result Value Ref Range Status   MRSA by PCR NEGATIVE NEGATIVE Final    Comment:        The GeneXpert MRSA Assay (FDA approved for NASAL specimens only), is one component of a comprehensive MRSA colonization surveillance program. It is not intended to diagnose MRSA infection nor to guide or monitor treatment for MRSA infections.   Culture, blood (Routine X 2) w Reflex to ID Panel     Status: None (Preliminary result)   Collection Time: 05/07/17  9:46 AM  Result Value Ref Range Status   Specimen Description BLOOD RIGHT ANTECUBITAL  Final   Special Requests   Final    BOTTLES DRAWN AEROBIC AND ANAEROBIC Blood Culture adequate volume   Culture   Final    NO GROWTH 3 DAYS Performed at [facility], [address], [place], [place] [address]    Report Status PENDING  Incomplete  Culture, blood (Routine X 2) w Reflex to ID Panel     Status: None (Preliminary result)   Collection Time: 05/07/17  9:53 AM  Result Value Ref Range Status   Specimen Description BLOOD RIGHT HAND  Final   Special Requests   Final    BOTTLES DRAWN AEROBIC AND ANAEROBIC Blood Culture adequate volume   Culture   Final    NO GROWTH 3 DAYS Performed at [facility], [place] [address], [place], [place] [address]    Report Status PENDING  Incomplete    Radiology Reports Dg Chest 2 View  Result Date: 05/05/2017 CLINICAL DATA:  A fever for the past 3 days. History of Crohn's disease, hypertension, diabetes, nonsmoker. EXAM: CHEST  2 VIEW COMPARISON:  Chest x-ray of May 14, 2016 FINDINGS: The lungs are adequately inflated. The interstitial markings are coarse though stable. The cardiac silhouette is top-normal in size. The pulmonary vascularity is normal. The mediastinum is normal in width. There is no pleural effusion. The bony thorax exhibits no acute abnormality. IMPRESSION: Chronic interstitial prominence bilaterally. No alveolar  pneumonia, CHF, nor other acute cardiopulmonary abnormality. Electronically Signed   By: David  Martinique M.D.   On: 05/05/2017 08:41   Ct Chest W Contrast  Result Date: 05/06/2017 CLINICAL DATA:  Rib pain.  Suspect rib fracture.  Bacteremia. EXAM: CT CHEST WITH CONTRAST TECHNIQUE: Multidetector CT imaging of the chest was performed during intravenous contrast administration. CONTRAST:  36m ISOVUE-300 IOPAMIDOL (ISOVUE-300) INJECTION 61% COMPARISON:  01/07/2013. FINDINGS: Cardiovascular: There is moderate cardiac enlargement. Aortic atherosclerosis. No pericardial effusion. Lad coronary artery calcifications noted. Mediastinum/Nodes: The trachea appears patent and is midline. Normal appearance of the esophagus. No adenopathy within the mediastinum or hilar regions. No axillary or supraclavicular adenopathy. Lungs/Pleura: Subsegmental subpleural atelectasis overlie posterior lower lobes noted. No pneumothorax or pulmonary contusion. Upper Abdomen: The spleen is incompletely visualized but appears enlarged measuring approximately 13 by 12.9 by 7.8 cm (volume = 680 cm^3). Hypertrophy of the lateral segment of left lobe of liver and caudate lobe of liver noted. Contour the liver is slightly irregular. No focal liver  abnormality. Cyst arises from the upper pole of the left kidney. Musculoskeletal: Degenerative disc disease identified within the thoracic spine. There are acute fractures involving the lateral aspect of the right fifth, sixth and seventh ribs. IMPRESSION: 1. Acute right fifth, sixth and seventh rib fracture deformities 2. Morphologic features of liver consistent with cirrhosis. Splenomegaly. 3. Aortic Atherosclerosis (ICD10-I70.0). Lad coronary artery calcifications noted. Electronically Signed   By: Kerby Moors M.D.   On: 05/06/2017 17:49   Ct Abdomen Pelvis W Contrast  Result Date: 05/05/2017 CLINICAL DATA:  History of Crohn's disease.  Constipation and pain EXAM: CT ABDOMEN AND PELVIS WITH  CONTRAST TECHNIQUE: Multidetector CT imaging of the abdomen and pelvis was performed using the standard protocol following bolus administration of intravenous contrast. Oral contrast was also administered. CONTRAST:  159m ISOVUE-300 IOPAMIDOL (ISOVUE-300) INJECTION 61% COMPARISON:  July 22, 2016 FINDINGS: Lower chest: There is bibasilar lung atelectasis, primarily on the right posteriorly. Hepatobiliary: No focal liver lesions are evident. Gallbladder wall is not appreciably thickened. There is no biliary duct dilatation. Pancreas: No pancreatic mass or inflammatory focus. Spleen: Spleen is enlarged, measuring 18.1 x 17.1 x 7.4 cm with a measured splenic volume of 1,145 cubic cm. No focal splenic lesions are evident. Adrenals/Urinary Tract: Adrenals appear normal bilaterally. There is a cyst arising from the posterior upper pole left kidney measuring 2.5 x 2.9 cm. There is a second cyst laterally on the left measuring 2.8 x 2.8 cm. On the right inferiorly and medially, there is an area of decreased attenuation measuring 2.3 x 2.3 cm which raises concern for a noncystic mass. There is no hydronephrosis on either side. There is a calculus in the left mid kidney measuring 1.3 x 0.7 cm. A second calculus on the left measures 1.1 x 0.7 cm. There is a 3 mm calculus in this area as well. There are no ureteral calculi on either side. Urinary bladder is midline with wall thickness within normal limits. Stomach/Bowel: There are scattered colonic diverticula without diverticulitis. There is no appreciable bowel wall or mesenteric thickening. There is moderate fluid throughout the colon without dilatation. No mesenteric thickening evident. No fistula noted. No bowel obstruction. No free air or portal venous air evident. Vascular/Lymphatic: There are foci of atherosclerotic calcification in the aorta and common iliac arteries. No aneurysm evident. Major mesenteric vessels appear patent. There is no adenopathy appreciable in  the abdomen or pelvis. Reproductive: Prostate and seminal vesicles appear normal in size and contour. No pelvic mass evident. Other: There is no periappendiceal inflammation. There is scarring in the umbilical region anteriorly. There is a fluid collection in this area with attenuation value higher than is expected with serous fluid. This collection is smaller than on prior study, currently measuring 4.9 x 4.7 x 2.0 cm. There is no air in this collection. There is no abscess or ascites evident in the abdomen or pelvis. There is fat in each inguinal ring. Musculoskeletal: No blastic or lytic bone lesions. There is degenerative change in the lower thoracic and lumbar spine. No intramuscular lesions are evident. IMPRESSION: 1. Within the right kidney, there is an area of decreased attenuation measuring 2.3 x 2.3 cm in the lower pole region inferiorly that cannot be classified as a cyst. Further assessment advised nonemergently. Further evaluation with pre and post contrast MRI or CT should be considered. MRI is preferred in younger patients (due to lack of ionizing radiation) and for evaluating calcified lesion(s). 2. Several nonobstructing calculi in left kidney. No hydronephrosis or  ureteral calculus on either side. 3.  Splenomegaly of uncertain etiology. 4. Sigmoid diverticula without diverticulitis. No bowel obstruction. No bowel wall thickening. No fistula. 5. Scarring at the level of a previously repaired umbilical hernia. Fluid in this area likely represents partially resolving and liquefying chronic hematoma. 6.  Mild aortic atherosclerosis. Aortic Atherosclerosis (ICD10-I70.0). Electronically Signed   By: Lowella Grip III M.D.   On: 05/05/2017 10:50     CBC Recent Labs  Lab 05/05/17 0821 05/06/17 1140 05/07/17 0635  WBC 8.7 6.2 4.7  HGB 11.5* 11.4* 10.6*  HCT 32.7* 32.0* 30.0*  PLT 105* 107* 108*  MCV 108.3* 106.7* 106.0*  MCH 38.1* 38.0* 37.5*  MCHC 35.2 35.6 35.3  RDW 15.0 15.6* 15.4    LYMPHSABS 0.4* 0.5*  --   MONOABS 1.0 0.7  --   EOSABS 0.1 0.1  --   BASOSABS 0.0 0.0  --     Chemistries  Recent Labs  Lab 05/05/17 0821 05/06/17 1140 05/07/17 0635  NA 136 138 138  K 3.7 3.9 3.5  CL 108 109 108  CO2 22 23 23   GLUCOSE 107* 112* 91  BUN 16 21* 13  CREATININE 0.69 0.93 0.80  CALCIUM 8.7* 8.7* 8.3*  AST 27 24 22   ALT 25 21 18   ALKPHOS 134* 149* 130*  BILITOT 5.0* 3.4* 2.9*   ------------------------------------------------------------------------------------------------------------------ No results for input(s): CHOL, HDL, LDLCALC, TRIG, CHOLHDL, LDLDIRECT in the last 72 hours.  Lab Results  Component Value Date   HGBA1C 4.7 (L) 02/11/2017   ------------------------------------------------------------------------------------------------------------------ No results for input(s): TSH, T4TOTAL, T3FREE, THYROIDAB in the last 72 hours.  Invalid input(s): FREET3 ------------------------------------------------------------------------------------------------------------------ No results for input(s): VITAMINB12, FOLATE, FERRITIN, TIBC, IRON, RETICCTPCT in the last 72 hours.  Coagulation profile Recent Labs  Lab 05/05/17 0821  INR 1.32    No results for input(s): DDIMER in the last 72 hours.  Cardiac Enzymes No results for input(s): CKMB, TROPONINI, MYOGLOBIN in the last 168 hours.  Invalid input(s): CK ------------------------------------------------------------------------------------------------------------------    Component Value Date/Time   BNP 4.7 12/20/2012 1503     [name] [name] M.D on 05/10/2017 at 8:31 PM  Between 7am to 7pm - Pager - [phone]  After 7pm go to [email or web address] - password [email or web address]  [facility] -  Office  [phone]   Voice Recognition Viviann Spare dictation system was used to create this note, attempts have been made to correct errors. Please contact the author with questions and/or clarifications.

## 2017-05-10 NOTE — Care Management Note (Signed)
Case Management Note  Patient Details  Name: [name] [name] MRN: [id number] Date of Birth: 04/09/1961  Subjective/Objective:    bacteremia                Action/Plan: Discharge Planning: NCM spoke to pt and [name] [name] arranged with [facility] for IV abx. Please see previous NCM notes. Pt states wife will be in the home to assist. [facility] cannot do a start of care until 05/11/2017 for IV abx. Will need signed OPAT orders for IV abx and [facility] RN order. Notified attending.    Expected Discharge Date:              Expected Discharge Plan:  [employer]  In-House Referral:  NA  Discharge planning Services  CM Consult  Post Acute Care Choice:  Home Health Choice offered to:  Patient  DME Arranged:    DME Agency:  NA  HH Arranged:  RN, IV Antibiotics HH Agency:  [facility]  Status of Service:  Completed, signed off  If discussed at [place] of Stay Meetings, dates discussed:    Additional Comments:  Erenest Rasher, RN 05/10/2017, 10:54 AM

## 2017-05-10 NOTE — Progress Notes (Signed)
[facility]  [facility] will be providing HHRN and Home Infusion Pharmacy services. Skype teaching provided with wife and pt last p.m. With good understanding. Pt able to perform infusion though wife will administer most doses. Wife states she is confident to administer 10 PM dose on 05-10-17 if pt is [place] home.  Pt DC was pending based on PICC placement.    If patient discharges after hours, please call [phone].   [name] [name] 05/10/2017, 11:19 AM

## 2017-05-10 NOTE — Progress Notes (Signed)
INFECTIOUS DISEASE ATTENDING ADDENDUM:   Date: 05/10/2017  Patient name: [place] record number: [id number]  Date of birth: [birth year]        [facility] Antimicrobial Management Team Staphylococcus aureus bacteremia   Staphylococcus aureus bacteremia (SAB) is associated with a high rate of complications and mortality.  Specific aspects of clinical management are critical to optimizing the outcome of patients with SAB.  Therefore, the [facility] Antimicrobial Management Team [facility]) has initiated an intervention aimed at improving the management of SAB at [facility].  To do so, Infectious Diseases physicians are providing an evidence-based consult for the management of all patients with SAB.     Yes No Comments  Perform follow-up blood cultures (even if the patient is afebrile) to ensure clearance of bacteremia [x]  []  NO GROWTH FROM ADMISSION AND other blood cultures  Remove vascular catheter and obtain follow-up blood cultures after the removal of the catheter []  []  OK TO place PICC  Perform echocardiography to evaluate for endocarditis (transthoracic ECHO is 40-50% sensitive, TEE is > 90% sensitive) []  []  Please keep in mind, that neither test can definitively EXCLUDE endocarditis, and that should clinical suspicion remain high for endocarditis the patient should then still be treated with an "endocarditis" duration of therapy = 6 weeks  TEE negative  Consult electrophysiologist to evaluate implanted cardiac device (pacemaker, ICD) []  []    Ensure source control [x]  []  Have all abscesses been drained effectively? Have deep seeded infections (septic joints or osteomyelitis) had appropriate surgical debridement?  Investigate for "metastatic" sites of infection [x]  []  Does the patient have ANY symptom or physical exam finding that would suggest a deeper infection (back or neck pain that may be suggestive of vertebral osteomyelitis or epidural abscess, muscle pain that  could be a symptom of pyomyositis)?  Keep in mind that for deep seeded infections MRI imaging with contrast is preferred rather than other often insensitive tests such as plain x-rays, especially early in a patient's presentation.  Change antibiotic therapy to cefazolin []  []  Beta-lactam antibiotics are preferred for MSSA due to higher cure rates.   If on Vancomycin, goal trough should be 15 - 20 mcg/mL  Estimated duration of IV antibiotic therapy:  4 weeks []  []  Consult case management for probably prolonged outpatient IV antibiotic therapy   Diagnosis: MSSA bacteremia  Culture Result: MSSA  Allergies  Allergen Reactions  . Penicillins Hives     Has patient had a PCN reaction causing immediate rash, facial/tongue/throat swelling, SOB or lightheadedness with hypotension: # # # YES # # #  Has patient had a PCN reaction causing severe rash involving mucus membranes or skin necrosis: # # UNKNOWN # #  Has patient had a PCN reaction that required hospitalization  . # # NO # #  Has patient had a PCN reaction occurring within the last 10 years:  # # NO # #  If all of the above answers are "NO", then may proceed with Cephalosporin use.   . Sulfonamide Derivatives Hives  . Azithromycin Swelling    SWELLING REACTION UNSPECIFIED   . Beef-Derived Products Diarrhea    RED MEAT > UNSPECIFIED REACTION   . Milk-Related Compounds Diarrhea    DAIRY >UNSPECIFIED REACTION   . Dilaudid [Hydromorphone Hcl] Nausea And Vomiting    OPAT Orders Discharge antibiotics:cefazolin 2 grams IV q 8 hours  Duration: 4 weeks  End Date:   December 10th, 2018  [place] Per Protocol:  Labs  weekly while on IV antibiotics: _x_ CBC with differential _x_ BMP   _x_ Please pull PIC at completion of IV antibiotics __ Please leave PIC in place until doctor has seen patient or been notified  Fax weekly labs to [phone]  Clinic Follow Up Appt:  Next 2-3 weeks with me.    Alcide Evener 05/10/2017, 7:27 AM

## 2017-05-10 NOTE — Progress Notes (Signed)
PHARMACY CONSULT NOTE FOR:  OUTPATIENT  PARENTERAL ANTIBIOTIC THERAPY (OPAT)  Indication: SAB Regimen: Ancef 2gm IV q8h End date: 06/02/17  IV antibiotic discharge orders are pended. To discharging provider:  please sign these orders via discharge navigator,  Select New Orders & click on the button choice - Manage This Unsigned Work.     Thank you for allowing pharmacy to be a part of this patient's care.  [name] [name] 05/10/2017, 9:01 AM

## 2017-05-11 ENCOUNTER — Encounter (HOSPITAL_COMMUNITY): Payer: Self-pay | Admitting: Cardiology

## 2017-05-11 DIAGNOSIS — Z452 Encounter for adjustment and management of vascular access device: Secondary | ICD-10-CM | POA: Diagnosis not present

## 2017-05-11 DIAGNOSIS — G4733 Obstructive sleep apnea (adult) (pediatric): Secondary | ICD-10-CM | POA: Diagnosis not present

## 2017-05-11 LAB — BASIC METABOLIC PANEL
Anion gap: 6 (ref 5–15)
BUN: 11 mg/dL (ref 6–20)
CHLORIDE: 109 mmol/L (ref 101–111)
CO2: 27 mmol/L (ref 22–32)
Calcium: 8.7 mg/dL — ABNORMAL LOW (ref 8.9–10.3)
Creatinine, Ser: 0.9 mg/dL (ref 0.61–1.24)
GFR calc Af Amer: >60 mL/min (ref >60)
GFR calc non Af Amer: >60 mL/min (ref >60)
GLUCOSE: 83 mg/dL (ref 65–99)
Potassium: 3.6 mmol/L (ref 3.5–5.1)
Sodium: 142 mmol/L (ref 135–145)

## 2017-05-11 LAB — CULTURE, BLOOD (ROUTINE X 2)
Culture: NO GROWTH
Culture: NO GROWTH
SPECIAL REQUESTS: ADEQUATE
Special Requests: ADEQUATE

## 2017-05-11 LAB — GLUCOSE, CAPILLARY
Glucose-Capillary: 77 mg/dL (ref 65–99)
Glucose-Capillary: 103 mg/dL — ABNORMAL HIGH (ref 65–99)

## 2017-05-11 LAB — CBC
HEMATOCRIT: 32.1 % — AB (ref 39.0–52.0)
HEMOGLOBIN: 11.5 g/dL — AB (ref 13.0–17.0)
MCH: 37.8 pg — AB (ref 26.0–34.0)
MCHC: 35.8 g/dL (ref 30.0–36.0)
MCV: 105.6 fL — AB (ref 78.0–100.0)
Platelets: 151 10*3/uL (ref 150–400)
RBC: 3.04 MIL/uL — AB (ref 4.22–5.81)
RDW: 15.6 % — ABNORMAL HIGH (ref 11.5–15.5)
WBC: 4.9 10*3/uL (ref 4.0–10.5)

## 2017-05-11 NOTE — Progress Notes (Signed)
Pt leaving at this time with his spouse. Alert, oriented, and without c/o. Discharge instructions/prescription given/explained with pt and spouse verbalizing understanding. Writer has spoke with , Lesleigh Noe, of [place] is aware pt being discharged and will be home by 2pm for antibiotic infusion. Pt aware of followup appointments.

## 2017-05-11 NOTE — Discharge Summary (Signed)
Physician Discharge Summary  [name] [name] JEH:[id number] DOB: Nov 27, 1960 DOA: 05/06/2017  PCP: Wardell Honour, MD  Admit date: 05/06/2017 Discharge date: 05/11/2017  Admitted From: Home Disposition: Home  Recommendations for Outpatient Follow-up:  1. Follow up with PCP in 1-2 weeks 2. BMP and CBC every week while on antibiotic 3. F/u with Dr. Tommy Medal in 2-3 weeks 4. Please follow up on the following pending results:  Home Health: Yes RN Equipment/Devices: PICC line  Discharge Condition: Stable CODE STATUS: Full code Diet recommendation: Carb modified heart healthy  Brief/Interim Summary: [name] [name] a 56 y.o.malewith medical history significant ofCrohn's disease involving small and large intestines, seizure disorder, arthritis, diabetes, depression, hypertension, hyperlipidemia who was recently seen at [facility] for abdominal pains. Patient underwent CT abdomen pelvis which had findings concerning for constipation. Patient was given a bowel regimen with market improvement of his abdominal discomfort. An incidental 2.3 x 2.3 cm lower pole lesion of decreased attenuation was noted. Recommendations were for pre-and postcontrast MRI or CT. During that visit, patient was found to have a urinalysis suggestive of a urinary tract infection with pan cultures obtained. Patient was discharged from the emergency department with levofloxacin. On the day of hospital admission, patient was informed that his blood culture was positive for staph aureus and to present to the emergency department.  ED Course:The emergency department, patient was found to have a white blood count of 6.2. Patient was afebrile. Repeat blood cultures were obtained, currently pending. ED physician has started the patient on empiric Ancef after discussion with pharmacy. Infectious disease was consulted. Hospitalist service was consulted for consideration for admission  Patient was found to have  MSSA bacteremia and infectious disease was consulted. Patient underwent TTE on 05/07/2017.  He was scheduled for TEE on 05/09/2017.     Discharge Diagnoses:  Principal Problem:   Bacteremia due to Staphylococcus aureus Active Problems:   Essential hypertension   CROHN'S DISEASE, LARGE AND SMALL INTESTINES   DM2 (diabetes mellitus, type 2) (HCC)   OSA on CPAP   Staphylococcus aureus bacteremia    Discharge Instructions  Discharge Instructions    Call MD for:  difficulty breathing, headache or visual disturbances   Complete by:  As directed    Call MD for:  extreme fatigue   Complete by:  As directed    Call MD for:  hives   Complete by:  As directed    Call MD for:  persistant dizziness or light-headedness   Complete by:  As directed    Call MD for:  persistant nausea and vomiting   Complete by:  As directed    Call MD for:  redness, tenderness, or signs of infection (pain, swelling, redness, odor or green/yellow discharge around incision site)   Complete by:  As directed    Call MD for:  severe uncontrolled pain   Complete by:  As directed    Call MD for:  temperature >100.4   Complete by:  As directed    Diet Carb Modified   Complete by:  As directed    Discharge instructions   Complete by:  As directed    Antibiotics as prescribed Follow up with PCP within 2-3 weeks CBC and BMP weekly while on antibiotics 2-3 week f/u with Dr. Tommy Medal   Home infusion instructions [place] May follow [place] Dosing Protocol; May administer Cathflo as needed to maintain patency of vascular access device.; Flushing of vascular access device: per [facility] Protocol:  0.9% NaCl pre/post medica...   Complete by:  As directed    Instructions:  May follow [place] Dosing Protocol   Instructions:  May administer Cathflo as needed to maintain patency of vascular access device.   Instructions:  Flushing of vascular access device: per [facility] Protocol: 0.9% NaCl pre/post medication  administration and prn patency; Heparin 100 u/ml, 71m for implanted ports and Heparin 10u/ml, 530mfor all other central venous catheters.   Instructions:  May follow [facility] Anaphylaxis Protocol for First Dose Administration in the home: 0.9% NaCl at 25-50 ml/hr to maintain IV access for protocol meds. Epinephrine 0.3 ml IV/IM PRN and Benadryl 25-50 IV/IM PRN s/s of anaphylaxis.   Instructions:  [facility] Coordinator (RN) to assist per patient IV care needs in the home PRN.   Increase activity slowly   Complete by:  As directed      Allergies as of 05/11/2017      Reactions   Penicillins Hives   Rash as child- tolerates Ancef   Sulfonamide Derivatives Hives   Azithromycin Swelling   SWELLING REACTION UNSPECIFIED    Beef-derived Products Diarrhea   RED MEAT > UNSPECIFIED REACTION    Milk-related Compounds Diarrhea   DAIRY >UNSPECIFIED REACTION    Dilaudid [hydromorphone Hcl] Nausea And Vomiting      Medication List    STOP taking these medications   Lacosamide 150 MG Tabs Commonly known as:  VIMPAT   levofloxacin 750 MG tablet Commonly known as:  LEVAQUIN     TAKE these medications   ADVIL PM PO Take 1 tablet at bedtime by mouth.   aspirin EC 81 MG tablet Take 81 mg at bedtime by mouth.   azelastine 0.1 % nasal spray Commonly known as:  ASTELIN Place 2 sprays into both nostrils 2 (two) times daily. Use in each nostril as directed What changed:    when to take this  additional instructions   calcium-vitamin D 500-200 MG-UNIT tablet Take 1 tablet 2 (two) times daily by mouth.   ceFAZolin IVPB Commonly known as:  ANCEF Inject 2 g every 8 (eight) hours for 23 days into the vein. Indication:  SA bacteremia Last Day of Therapy:  06/02/17 Labs - Once weekly:  CBC/D and BMP, Labs - Every other week:  ESR and CRP   cholecalciferol 1000 units tablet Commonly known as:  VITAMIN D Take 1,000 Units at bedtime by mouth.   DAIRY-RELIEF PO Take 2 tablets by  mouth 2 (two) times daily.   diltiazem 180 MG 24 hr capsule Commonly known as:  TIAZAC Take 1 capsule (180 mg total) by mouth daily.   eletriptan 40 MG tablet Commonly known as:  RELPAX Take 1 tablet at onset of headache, may repeat in 2 hours if headache persists or reoccurs. What changed:    how much to take  how to take this  when to take this  reasons to take this  additional instructions   esomeprazole 40 MG capsule Commonly known as:  NEXIUM TAKE 1 CAPSULE TWICE A DAY   Eszopiclone 3 MG Tabs Take 1 tablet (3 mg total) by mouth at bedtime. Take immediately before bedtime   ferrous sulfate 325 (65 FE) MG tablet Take 325 mg at bedtime by mouth.   fluorouracil 5 % cream Commonly known as:  EFUDEX Apply 1 application at bedtime topically. For Basal Cell Carinoma of arms   folic acid 1 MG tablet Commonly known as:  FOLVITE Take 2 tablets (2 mg  total) by mouth daily.   ONE TOUCH ULTRA TEST test strip Generic drug:  glucose blood USE AS INSTRUCTED   glucose blood test strip Commonly known as:  CHOICE DM FORA G20 TEST STRIPS Use as instructed   lamoTRIgine 100 MG tablet Commonly known as:  LAMICTAL Take 1 tablet (100 mg total) by mouth 2 (two) times daily. What changed:  Another medication with the same name was removed. Continue taking this medication, and follow the directions you see here.   loratadine 10 MG tablet Commonly known as:  CLARITIN Take 10 mg at bedtime by mouth.   mercaptopurine 50 MG tablet Commonly known as:  PURINETHOL TAKE 2 TABLETS DAILY ON AN EMPTY STOMACH 1 HOUR BEFOREOR 2 HOURS AFTER MEALS     (CAUTION: CHEMOTHERAPY)   mesalamine 1.2 g EC tablet Commonly known as:  LIALDA Take 2 tablets (2.4 g total) by mouth daily. What changed:    how much to take  when to take this   methscopolamine 5 MG tablet Commonly known as:  PAMINE FORTE Take 1 tablet (5 mg total) by mouth 2 (two) times daily.   polyethylene glycol packet Commonly  known as:  MIRALAX / GLYCOLAX Take 17 g daily by mouth.   potassium chloride SA 20 MEQ tablet Commonly known as:  K-DUR,KLOR-CON Take 1 tablet (20 mEq total) by mouth 2 (two) times daily.   PROBIOTIC PO Take 1 tablet at bedtime by mouth.   sertraline 100 MG tablet Commonly known as:  ZOLOFT Take 200 mg daily with breakfast by mouth.   traZODone 100 MG tablet Commonly known as:  DESYREL Take 50-100 mg at bedtime by mouth. What changed:  Another medication with the same name was removed. Continue taking this medication, and follow the directions you see here.   VITAMIN C PO Take 1 tablet at bedtime by mouth.            Home Infusion Instuctions  (From admission, onward)        Start     Ordered   05/10/17 0000  Home infusion instructions [facility] May follow [place] Dosing Protocol; May administer Cathflo as needed to maintain patency of vascular access device.; Flushing of vascular access device: per [facility] Protocol: 0.9% NaCl pre/post medica...    Question Answer Comment  Instructions May follow [facility] [place] Dosing Protocol   Instructions May administer Cathflo as needed to maintain patency of vascular access device.   Instructions Flushing of vascular access device: per [facility] Protocol: 0.9% NaCl pre/post medication administration and prn patency; Heparin 100 u/ml, 48m for implanted ports and Heparin 10u/ml, 524mfor all other central venous catheters.   Instructions May follow [facility] Anaphylaxis Protocol for First Dose Administration in the home: 0.9% NaCl at 25-50 ml/hr to maintain IV access for protocol meds. Epinephrine 0.3 ml IV/IM PRN and Benadryl 25-50 IV/IM PRN s/s of anaphylaxis.   Instructions [facility] Infusion Coordinator (RN) to assist per patient IV care needs in the home PRN.      05/10/17 1542     Follow-up Information    [facility], [facility]-Home Follow up.   Specialty:  Home Health Services Why:  Home Health RN, IV antibiotics.  [facility] [place] supply IV medication and RN will call to arrage initial appt.  CBC and BMP every week Contact information: [phone]      SmWardell HonourMD. Schedule an appointment as soon  as possible for a visit in 1 week(s).   Specialty:  Family Medicine Contact information: [place] [address] [phone]        Tommy Medal, Lavell Islam, MD. Schedule an appointment as soon as possible for a visit in 2 week(s).   Specialty:  Infectious Diseases Contact information: [address] [address] [phone]          Allergies  Allergen Reactions  . Penicillins Hives    Rash as child- tolerates Ancef   . Sulfonamide Derivatives Hives  . Azithromycin Swelling    SWELLING REACTION UNSPECIFIED   . Beef-Derived Products Diarrhea    RED MEAT > UNSPECIFIED REACTION   . Milk-Related Compounds Diarrhea    DAIRY >UNSPECIFIED REACTION   . Dilaudid [Hydromorphone Hcl] Nausea And Vomiting    Consultations:  Infectious disease  Cardiology   Procedures/Studies: Dg Chest 2 View  Result Date: 05/05/2017 CLINICAL DATA:  A fever for the past 3 days. History of Crohn's disease, hypertension, diabetes, nonsmoker. EXAM: CHEST  2 VIEW COMPARISON:  Chest x-ray of May 14, 2016 FINDINGS: The lungs are adequately inflated. The interstitial markings are coarse though stable. The cardiac silhouette is top-normal in size. The pulmonary vascularity is normal. The mediastinum is normal in width. There is no pleural effusion. The bony thorax exhibits no acute abnormality. IMPRESSION: Chronic interstitial prominence bilaterally. No alveolar pneumonia, CHF, nor other acute cardiopulmonary abnormality. Electronically Signed   By: David  Martinique M.D.   On: 05/05/2017 08:41   Ct Chest W Contrast  Result Date: 05/06/2017 CLINICAL DATA:  Rib pain.  Suspect rib fracture.  Bacteremia. EXAM: CT CHEST WITH  CONTRAST TECHNIQUE: Multidetector CT imaging of the chest was performed during intravenous contrast administration. CONTRAST:  51m ISOVUE-300 IOPAMIDOL (ISOVUE-300) INJECTION 61% COMPARISON:  01/07/2013. FINDINGS: Cardiovascular: There is moderate cardiac enlargement. Aortic atherosclerosis. No pericardial effusion. Lad coronary artery calcifications noted. Mediastinum/Nodes: The trachea appears patent and is midline. Normal appearance of the esophagus. No adenopathy within the mediastinum or hilar regions. No axillary or supraclavicular adenopathy. Lungs/Pleura: Subsegmental subpleural atelectasis overlie posterior lower lobes noted. No pneumothorax or pulmonary contusion. Upper Abdomen: The spleen is incompletely visualized but appears enlarged measuring approximately 13 by 12.9 by 7.8 cm (volume = 680 cm^3). Hypertrophy of the lateral segment of left lobe of liver and caudate lobe of liver noted. Contour the liver is slightly irregular. No focal liver abnormality. Cyst arises from the upper pole of the left kidney. Musculoskeletal: Degenerative disc disease identified within the thoracic spine. There are acute fractures involving the lateral aspect of the right fifth, sixth and seventh ribs. IMPRESSION: 1. Acute right fifth, sixth and seventh rib fracture deformities 2. Morphologic features of liver consistent with cirrhosis. Splenomegaly. 3. Aortic Atherosclerosis (ICD10-I70.0). Lad coronary artery calcifications noted. Electronically Signed   By: TKerby MoorsM.D.   On: 05/06/2017 17:49   Ct Abdomen Pelvis W Contrast  Result Date: 05/05/2017 CLINICAL DATA:  History of Crohn's disease.  Constipation and pain EXAM: CT ABDOMEN AND PELVIS WITH CONTRAST TECHNIQUE: Multidetector CT imaging of the abdomen and pelvis was performed using the standard protocol following bolus administration of intravenous contrast. Oral contrast was also administered. CONTRAST:  1019mISOVUE-300 IOPAMIDOL (ISOVUE-300) INJECTION  61% COMPARISON:  July 22, 2016 FINDINGS: Lower chest: There is bibasilar lung atelectasis, primarily on the right posteriorly. Hepatobiliary: No focal liver lesions are evident. Gallbladder wall is not appreciably thickened. There is no biliary duct dilatation. Pancreas: No pancreatic mass  or inflammatory focus. Spleen: Spleen is enlarged, measuring 18.1 x 17.1 x 7.4 cm with a measured splenic volume of 1,145 cubic cm. No focal splenic lesions are evident. Adrenals/Urinary Tract: Adrenals appear normal bilaterally. There is a cyst arising from the posterior upper pole left kidney measuring 2.5 x 2.9 cm. There is a second cyst laterally on the left measuring 2.8 x 2.8 cm. On the right inferiorly and medially, there is an area of decreased attenuation measuring 2.3 x 2.3 cm which raises concern for a noncystic mass. There is no hydronephrosis on either side. There is a calculus in the left mid kidney measuring 1.3 x 0.7 cm. A second calculus on the left measures 1.1 x 0.7 cm. There is a 3 mm calculus in this area as well. There are no ureteral calculi on either side. Urinary bladder is midline with wall thickness within normal limits. Stomach/Bowel: There are scattered colonic diverticula without diverticulitis. There is no appreciable bowel wall or mesenteric thickening. There is moderate fluid throughout the colon without dilatation. No mesenteric thickening evident. No fistula noted. No bowel obstruction. No free air or portal venous air evident. Vascular/Lymphatic: There are foci of atherosclerotic calcification in the aorta and common iliac arteries. No aneurysm evident. Major mesenteric vessels appear patent. There is no adenopathy appreciable in the abdomen or pelvis. Reproductive: Prostate and seminal vesicles appear normal in size and contour. No pelvic mass evident. Other: There is no periappendiceal inflammation. There is scarring in the umbilical region anteriorly. There is a fluid collection in this  area with attenuation value higher than is expected with serous fluid. This collection is smaller than on prior study, currently measuring 4.9 x 4.7 x 2.0 cm. There is no air in this collection. There is no abscess or ascites evident in the abdomen or pelvis. There is fat in each inguinal ring. Musculoskeletal: No blastic or lytic bone lesions. There is degenerative change in the lower thoracic and lumbar spine. No intramuscular lesions are evident. IMPRESSION: 1. Within the right kidney, there is an area of decreased attenuation measuring 2.3 x 2.3 cm in the lower pole region inferiorly that cannot be classified as a cyst. Further assessment advised nonemergently. Further evaluation with pre and post contrast MRI or CT should be considered. MRI is preferred in younger patients (due to lack of ionizing radiation) and for evaluating calcified lesion(s). 2. Several nonobstructing calculi in left kidney. No hydronephrosis or ureteral calculus on either side. 3.  Splenomegaly of uncertain etiology. 4. Sigmoid diverticula without diverticulitis. No bowel obstruction. No bowel wall thickening. No fistula. 5. Scarring at the level of a previously repaired umbilical hernia. Fluid in this area likely represents partially resolving and liquefying chronic hematoma. 6.  Mild aortic atherosclerosis. Aortic Atherosclerosis (ICD10-I70.0). Electronically Signed   By: Lowella Grip III M.D.   On: 05/05/2017 10:50      Subjective: Patient seen and evaluated.  He is anxious to go home.  Home health has been set up for IV antibiotics at home.  Understands follow-up instructions.  Denies fevers chills.  No chest pain or shortness of breath.  Discharge Exam: Vitals:   05/10/17 2011 05/11/17 0619  BP: 135/73 (!) 113/43  Pulse: 75 66  Resp: 18 16  Temp: 98.2 F (36.8 C) 97.7 F (36.5 C)  SpO2: 98% 97%   Vitals:   05/10/17 0542 05/10/17 0920 05/10/17 2011 05/11/17 0619  BP: 134/79 109/65 135/73 (!) 113/43  Pulse:  73 68 75 66  Resp: 18 18  18 16  Temp: 98 F (36.7 C) 98 F (36.7 C) 98.2 F (36.8 C) 97.7 F (36.5 C)  TempSrc: Oral Oral Oral Axillary  SpO2: 100% 97% 98% 97%  Weight:      Height:        General: Pt is alert, awake, not in acute distress Cardiovascular: RRR, S1/S2 +, no rubs, no gallops Respiratory: CTA bilaterally, no wheezing, no rhonchi Abdominal: Soft, NT, ND, bowel sounds + Extremities: no edema, no cyanosis    The results of significant diagnostics from this hospitalization (including imaging, microbiology, ancillary and laboratory) are listed below for reference.     Microbiology: Recent Results (from the past 240 hour(s))  Blood culture (routine x 2)     Status: Abnormal   Collection Time: 05/05/17  8:20 AM  Result Value Ref Range Status   Specimen Description BLOOD RIGHT ARM  Final   Special Requests   Final    BOTTLES DRAWN AEROBIC AND ANAEROBIC Blood Culture adequate volume   Culture  Setup Time   Final    GRAM POSITIVE COCCI AEROBIC BOTTLE ONLY CRITICAL VALUE NOTED.  VALUE IS CONSISTENT WITH PREVIOUSLY REPORTED AND CALLED VALUE.    Culture (A)  Final    STAPHYLOCOCCUS AUREUS SUSCEPTIBILITIES PERFORMED ON PREVIOUS CULTURE WITHIN THE LAST 5 DAYS. Performed at [facility], [place] [address], [place], [place] [address]    Report Status 05/08/2017 FINAL  Final  Blood culture (routine x 2)     Status: Abnormal   Collection Time: 05/05/17  8:25 AM  Result Value Ref Range Status   Specimen Description BLOOD LEFT ARM  Final   Special Requests   Final    BOTTLES DRAWN AEROBIC AND ANAEROBIC Blood Culture adequate volume   Culture  Setup Time   Final    GRAM POSITIVE COCCI IN CLUSTERS IN BOTH AEROBIC AND ANAEROBIC BOTTLES CRITICAL RESULT CALLED TO, READ BACK BY AND VERIFIED WITH: MSIMMS,RN @0826  05/06/17 BY LHOWARD Performed at [facility], [place] [address], [place], [place] [address]    Culture STAPHYLOCOCCUS AUREUS (A)  Final   Report Status  05/08/2017 FINAL  Final   Organism ID, Bacteria STAPHYLOCOCCUS AUREUS  Final      Susceptibility   Staphylococcus aureus - MIC*    CIPROFLOXACIN <=0.5 SENSITIVE Sensitive     ERYTHROMYCIN <=0.25 SENSITIVE Sensitive     GENTAMICIN <=0.5 SENSITIVE Sensitive     OXACILLIN <=0.25 SENSITIVE Sensitive     TETRACYCLINE <=1 SENSITIVE Sensitive     VANCOMYCIN 1 SENSITIVE Sensitive     TRIMETH/SULFA <=10 SENSITIVE Sensitive     CLINDAMYCIN <=0.25 SENSITIVE Sensitive     RIFAMPIN <=0.5 SENSITIVE Sensitive     Inducible Clindamycin NEGATIVE Sensitive     * STAPHYLOCOCCUS AUREUS  Blood Culture ID Panel (Reflexed)     Status: Abnormal   Collection Time: 05/05/17  8:25 AM  Result Value Ref Range Status   Enterococcus species NOT DETECTED NOT DETECTED Final   Listeria monocytogenes NOT DETECTED NOT DETECTED Final   Staphylococcus species DETECTED (A) NOT DETECTED Final    Comment: CRITICAL RESULT CALLED TO, READ BACK BY AND VERIFIED WITH: MSIMMS,RN @0826  05/06/17 BY LHOWARD    Staphylococcus aureus DETECTED (A) NOT DETECTED Final    Comment: Methicillin (oxacillin) susceptible Staphylococcus aureus (MSSA). Preferred therapy is anti staphylococcal beta lactam antibiotic (Cefazolin or Nafcillin), unless clinically contraindicated. CRITICAL RESULT CALLED TO, READ BACK BY AND VERIFIED WITH: MSIMMS,RN @0826  05/06/17 BY [facility]  Methicillin resistance NOT DETECTED NOT DETECTED Final   Streptococcus species NOT DETECTED NOT DETECTED Final   Streptococcus agalactiae NOT DETECTED NOT DETECTED Final   Streptococcus pneumoniae NOT DETECTED NOT DETECTED Final   Streptococcus pyogenes NOT DETECTED NOT DETECTED Final   Acinetobacter baumannii NOT DETECTED NOT DETECTED Final   Enterobacteriaceae species NOT DETECTED NOT DETECTED Final   Enterobacter cloacae complex NOT DETECTED NOT DETECTED Final   Escherichia coli NOT DETECTED NOT DETECTED Final   Klebsiella oxytoca NOT DETECTED NOT DETECTED Final    Klebsiella pneumoniae NOT DETECTED NOT DETECTED Final   Proteus species NOT DETECTED NOT DETECTED Final   Serratia marcescens NOT DETECTED NOT DETECTED Final   Haemophilus influenzae NOT DETECTED NOT DETECTED Final   Neisseria meningitidis NOT DETECTED NOT DETECTED Final   Pseudomonas aeruginosa NOT DETECTED NOT DETECTED Final   Candida albicans NOT DETECTED NOT DETECTED Final   Candida glabrata NOT DETECTED NOT DETECTED Final   Candida krusei NOT DETECTED NOT DETECTED Final   Candida parapsilosis NOT DETECTED NOT DETECTED Final   Candida tropicalis NOT DETECTED NOT DETECTED Final    Comment: Performed at [facility], [place] [address], [place], [place] [address]  Urine culture     Status: Abnormal   Collection Time: 05/05/17  9:15 AM  Result Value Ref Range Status   Specimen Description URINE, CLEAN CATCH  Final   Special Requests NONE  Final   Culture >=100,000 COLONIES/mL STAPHYLOCOCCUS AUREUS (A)  Final   Report Status 05/07/2017 FINAL  Final   Organism ID, Bacteria STAPHYLOCOCCUS AUREUS (A)  Final      Susceptibility   Staphylococcus aureus - MIC*    CIPROFLOXACIN <=0.5 SENSITIVE Sensitive     GENTAMICIN <=0.5 SENSITIVE Sensitive     NITROFURANTOIN <=16 SENSITIVE Sensitive     OXACILLIN 0.5 SENSITIVE Sensitive     TETRACYCLINE <=1 SENSITIVE Sensitive     VANCOMYCIN 1 SENSITIVE Sensitive     TRIMETH/SULFA <=10 SENSITIVE Sensitive     CLINDAMYCIN <=0.25 SENSITIVE Sensitive     RIFAMPIN <=0.5 SENSITIVE Sensitive     Inducible Clindamycin NEGATIVE Sensitive     * >=100,000 COLONIES/mL STAPHYLOCOCCUS AUREUS  Culture, blood (routine x 2)     Status: None   Collection Time: 05/06/17 11:30 AM  Result Value Ref Range Status   Specimen Description BLOOD LEFT ANTECUBITAL  Final   Special Requests   Final    BOTTLES DRAWN AEROBIC AND ANAEROBIC Blood Culture adequate volume   Culture   Final    NO GROWTH 5 DAYS Performed at [facility], [place] [address], [place], [place]  [address]    Report Status 05/11/2017 FINAL  Final  Culture, blood (routine x 2)     Status: None   Collection Time: 05/06/17 11:45 AM  Result Value Ref Range Status   Specimen Description BLOOD RIGHT ANTECUBITAL  Final   Special Requests   Final    BOTTLES DRAWN AEROBIC AND ANAEROBIC Blood Culture adequate volume   Culture   Final    NO GROWTH 5 DAYS Performed at [facility], [place] [address] [place], [place] [address]    Report Status 05/11/2017 FINAL  Final  MRSA PCR Screening     Status: None   Collection Time: 05/06/17  4:43 PM  Result Value Ref Range Status   MRSA by PCR NEGATIVE NEGATIVE Final    Comment:        The GeneXpert MRSA Assay (FDA approved for  NASAL specimens only), is one component of a comprehensive MRSA colonization surveillance program. It is not intended to diagnose MRSA infection nor to guide or monitor treatment for MRSA infections.   Culture, blood (Routine X 2) w Reflex to ID Panel     Status: None (Preliminary result)   Collection Time: 05/07/17  9:46 AM  Result Value Ref Range Status   Specimen Description BLOOD RIGHT ANTECUBITAL  Final   Special Requests   Final    BOTTLES DRAWN AEROBIC AND ANAEROBIC Blood Culture adequate volume   Culture   Final    NO GROWTH 4 DAYS Performed at [facility], [address], [place], [place] [address]    Report Status PENDING  Incomplete  Culture, blood (Routine X 2) w Reflex to ID Panel     Status: None (Preliminary result)   Collection Time: 05/07/17  9:53 AM  Result Value Ref Range Status   Specimen Description BLOOD RIGHT HAND  Final   Special Requests   Final    BOTTLES DRAWN AEROBIC AND ANAEROBIC Blood Culture adequate volume   Culture   Final    NO GROWTH 4 DAYS Performed at [facility], [place] [address] [place], [place] [address]    Report Status PENDING  Incomplete     Labs: BNP (last 3 results) No results for input(s): BNP in the last 8760 hours. Basic Metabolic  Panel: Recent Labs  Lab 05/05/17 0821 05/06/17 1140 05/07/17 0635 05/11/17 0523  NA 136 138 138 142  K 3.7 3.9 3.5 3.6  CL 108 109 108 109  CO2 22 23 23 27   GLUCOSE 107* 112* 91 83  BUN 16 21* 13 11  CREATININE 0.69 0.93 0.80 0.90  CALCIUM 8.7* 8.7* 8.3* 8.7*   Liver Function Tests: Recent Labs  Lab 05/05/17 0821 05/06/17 1140 05/07/17 0635  AST 27 24 22   ALT 25 21 18   ALKPHOS 134* 149* 130*  BILITOT 5.0* 3.4* 2.9*  PROT 6.7 6.8 6.4*  ALBUMIN 3.8 3.7 3.4*   Recent Labs  Lab 05/05/17 0821  LIPASE 17   No results for input(s): AMMONIA in the last 168 hours. CBC: Recent Labs  Lab 05/05/17 0821 05/06/17 1140 05/07/17 0635 05/11/17 0523  WBC 8.7 6.2 4.7 4.9  NEUTROABS 7.2 4.9  --   --   HGB 11.5* 11.4* 10.6* 11.5*  HCT 32.7* 32.0* 30.0* 32.1*  MCV 108.3* 106.7* 106.0* 105.6*  PLT 105* 107* 108* 151   Cardiac Enzymes: No results for input(s): CKTOTAL, CKMB, CKMBINDEX, TROPONINI in the last 168 hours. BNP: Invalid input(s): POCBNP CBG: Recent Labs  Lab 05/10/17 1138 05/10/17 1655 05/10/17 2139 05/11/17 0743 05/11/17 1117  GLUCAP 80 122* 117* 77 103*   D-Dimer No results for input(s): DDIMER in the last 72 hours. Hgb A1c No results for input(s): HGBA1C in the last 72 hours. Lipid Profile No results for input(s): CHOL, HDL, LDLCALC, TRIG, CHOLHDL, LDLDIRECT in the last 72 hours. Thyroid function studies No results for input(s): TSH, T4TOTAL, T3FREE, THYROIDAB in the last 72 hours.  Invalid input(s): FREET3 Anemia work up No results for input(s): VITAMINB12, FOLATE, FERRITIN, TIBC, IRON, RETICCTPCT in the last 72 hours. Urinalysis    Component Value Date/Time   COLORURINE ORANGE (A) 05/05/2017 0910   APPEARANCEUR CLOUDY (A) 05/05/2017 0910   LABSPEC 1.020 05/05/2017 0910   PHURINE 6.5 05/05/2017 0910   GLUCOSEU NEGATIVE 05/05/2017 0910   HGBUR SMALL (A) 05/05/2017 0910   BILIRUBINUR NEGATIVE 05/05/2017 0910  BILIRUBINUR small (A) 08/20/2016  1116   BILIRUBINUR neg 07/01/2013 0857   KETONESUR NEGATIVE 05/05/2017 0910   PROTEINUR 30 (A) 05/05/2017 0910   UROBILINOGEN 1.0 08/20/2016 1116   UROBILINOGEN 0.2 10/29/2012 1915   NITRITE NEGATIVE 05/05/2017 0910   LEUKOCYTESUR LARGE (A) 05/05/2017 0910   Sepsis Labs Invalid input(s): PROCALCITONIN,  WBC,  LACTICIDVEN Microbiology Recent Results (from the past 240 hour(s))  Blood culture (routine x 2)     Status: Abnormal   Collection Time: 05/05/17  8:20 AM  Result Value Ref Range Status   Specimen Description BLOOD RIGHT ARM  Final   Special Requests   Final    BOTTLES DRAWN AEROBIC AND ANAEROBIC Blood Culture adequate volume   Culture  Setup Time   Final    GRAM POSITIVE COCCI AEROBIC BOTTLE ONLY CRITICAL VALUE NOTED.  VALUE IS CONSISTENT WITH PREVIOUSLY REPORTED AND CALLED VALUE.    Culture (A)  Final    STAPHYLOCOCCUS AUREUS SUSCEPTIBILITIES PERFORMED ON PREVIOUS CULTURE WITHIN THE LAST 5 DAYS. Performed at [facility], [place] [address] [place], [place] [address]    Report Status 05/08/2017 FINAL  Final  Blood culture (routine x 2)     Status: Abnormal   Collection Time: 05/05/17  8:25 AM  Result Value Ref Range Status   Specimen Description BLOOD LEFT ARM  Final   Special Requests   Final    BOTTLES DRAWN AEROBIC AND ANAEROBIC Blood Culture adequate volume   Culture  Setup Time   Final    GRAM POSITIVE COCCI IN CLUSTERS IN BOTH AEROBIC AND ANAEROBIC BOTTLES CRITICAL RESULT CALLED TO, READ BACK BY AND VERIFIED WITH: MSIMMS,RN @0826  05/06/17 BY LHOWARD Performed at [facility], [place] [address] [place], [place] [address]    Culture STAPHYLOCOCCUS AUREUS (A)  Final   Report Status 05/08/2017 FINAL  Final   Organism ID, Bacteria STAPHYLOCOCCUS AUREUS  Final      Susceptibility   Staphylococcus aureus - MIC*    CIPROFLOXACIN <=0.5 SENSITIVE Sensitive     ERYTHROMYCIN <=0.25 SENSITIVE Sensitive     GENTAMICIN <=0.5 SENSITIVE Sensitive     OXACILLIN  <=0.25 SENSITIVE Sensitive     TETRACYCLINE <=1 SENSITIVE Sensitive     VANCOMYCIN 1 SENSITIVE Sensitive     TRIMETH/SULFA <=10 SENSITIVE Sensitive     CLINDAMYCIN <=0.25 SENSITIVE Sensitive     RIFAMPIN <=0.5 SENSITIVE Sensitive     Inducible Clindamycin NEGATIVE Sensitive     * STAPHYLOCOCCUS AUREUS  Blood Culture ID Panel (Reflexed)     Status: Abnormal   Collection Time: 05/05/17  8:25 AM  Result Value Ref Range Status   Enterococcus species NOT DETECTED NOT DETECTED Final   Listeria monocytogenes NOT DETECTED NOT DETECTED Final   Staphylococcus species DETECTED (A) NOT DETECTED Final    Comment: CRITICAL RESULT CALLED TO, READ BACK BY AND VERIFIED WITH: MSIMMS,RN @0826  05/06/17 BY LHOWARD    Staphylococcus aureus DETECTED (A) NOT DETECTED Final    Comment: Methicillin (oxacillin) susceptible Staphylococcus aureus (MSSA). Preferred therapy is anti staphylococcal beta lactam antibiotic (Cefazolin or Nafcillin), unless clinically contraindicated. CRITICAL RESULT CALLED TO, READ BACK BY AND VERIFIED WITH: MSIMMS,RN @0826  05/06/17 BY LHOWARD    Methicillin resistance NOT DETECTED NOT DETECTED Final   Streptococcus species NOT DETECTED NOT DETECTED Final   Streptococcus agalactiae NOT DETECTED NOT DETECTED Final   Streptococcus pneumoniae NOT DETECTED NOT DETECTED Final   Streptococcus pyogenes NOT DETECTED NOT DETECTED Final   Acinetobacter baumannii NOT  DETECTED NOT DETECTED Final   Enterobacteriaceae species NOT DETECTED NOT DETECTED Final   Enterobacter cloacae complex NOT DETECTED NOT DETECTED Final   Escherichia coli NOT DETECTED NOT DETECTED Final   Klebsiella oxytoca NOT DETECTED NOT DETECTED Final   Klebsiella pneumoniae NOT DETECTED NOT DETECTED Final   Proteus species NOT DETECTED NOT DETECTED Final   Serratia marcescens NOT DETECTED NOT DETECTED Final   Haemophilus influenzae NOT DETECTED NOT DETECTED Final   Neisseria meningitidis NOT DETECTED NOT DETECTED Final    Pseudomonas aeruginosa NOT DETECTED NOT DETECTED Final   Candida albicans NOT DETECTED NOT DETECTED Final   Candida glabrata NOT DETECTED NOT DETECTED Final   Candida krusei NOT DETECTED NOT DETECTED Final   Candida parapsilosis NOT DETECTED NOT DETECTED Final   Candida tropicalis NOT DETECTED NOT DETECTED Final    Comment: Performed at [facility], [place] [address] [place], [place] [address]  Urine culture     Status: Abnormal   Collection Time: 05/05/17  9:15 AM  Result Value Ref Range Status   Specimen Description URINE, CLEAN CATCH  Final   Special Requests NONE  Final   Culture >=100,000 COLONIES/mL STAPHYLOCOCCUS AUREUS (A)  Final   Report Status 05/07/2017 FINAL  Final   Organism ID, Bacteria STAPHYLOCOCCUS AUREUS (A)  Final      Susceptibility   Staphylococcus aureus - MIC*    CIPROFLOXACIN <=0.5 SENSITIVE Sensitive     GENTAMICIN <=0.5 SENSITIVE Sensitive     NITROFURANTOIN <=16 SENSITIVE Sensitive     OXACILLIN 0.5 SENSITIVE Sensitive     TETRACYCLINE <=1 SENSITIVE Sensitive     VANCOMYCIN 1 SENSITIVE Sensitive     TRIMETH/SULFA <=10 SENSITIVE Sensitive     CLINDAMYCIN <=0.25 SENSITIVE Sensitive     RIFAMPIN <=0.5 SENSITIVE Sensitive     Inducible Clindamycin NEGATIVE Sensitive     * >=100,000 COLONIES/mL STAPHYLOCOCCUS AUREUS  Culture, blood (routine x 2)     Status: None   Collection Time: 05/06/17 11:30 AM  Result Value Ref Range Status   Specimen Description BLOOD LEFT ANTECUBITAL  Final   Special Requests   Final    BOTTLES DRAWN AEROBIC AND ANAEROBIC Blood Culture adequate volume   Culture   Final    NO GROWTH 5 DAYS Performed at [facility], [place] [address], [place], [place] [address]    Report Status 05/11/2017 FINAL  Final  Culture, blood (routine x 2)     Status: None   Collection Time: 05/06/17 11:45 AM  Result Value Ref Range Status   Specimen Description BLOOD RIGHT ANTECUBITAL  Final   Special Requests   Final    BOTTLES DRAWN AEROBIC  AND ANAEROBIC Blood Culture adequate volume   Culture   Final    NO GROWTH 5 DAYS Performed at [facility], [place] [address], [place], [place] [address]    Report Status 05/11/2017 FINAL  Final  MRSA PCR Screening     Status: None   Collection Time: 05/06/17  4:43 PM  Result Value Ref Range Status   MRSA by PCR NEGATIVE NEGATIVE Final    Comment:        The GeneXpert MRSA Assay (FDA approved for NASAL specimens only), is one component of a comprehensive MRSA colonization surveillance program. It is not intended to diagnose MRSA infection nor to guide or monitor treatment for MRSA infections.   Culture, blood (Routine X 2) w Reflex to ID Panel     Status: None (  Preliminary result)   Collection Time: 05/07/17  9:46 AM  Result Value Ref Range Status   Specimen Description BLOOD RIGHT ANTECUBITAL  Final   Special Requests   Final    BOTTLES DRAWN AEROBIC AND ANAEROBIC Blood Culture adequate volume   Culture   Final    NO GROWTH 4 DAYS Performed at [facility], [address], [place], [place] [address]    Report Status PENDING  Incomplete  Culture, blood (Routine X 2) w Reflex to ID Panel     Status: None (Preliminary result)   Collection Time: 05/07/17  9:53 AM  Result Value Ref Range Status   Specimen Description BLOOD RIGHT HAND  Final   Special Requests   Final    BOTTLES DRAWN AEROBIC AND ANAEROBIC Blood Culture adequate volume   Culture   Final    NO GROWTH 4 DAYS Performed at [facility], [place] [address], [place], [place] [address]    Report Status PENDING  Incomplete     Time coordinating discharge: 35 minutes  SIGNED:   Loretha Stapler, MD  [facility] 05/11/2017, 11:58 AM Pager [phone] If 7PM-7AM, please contact night-coverage [email or web address] Password [email or web address]

## 2017-05-12 ENCOUNTER — Encounter: Payer: Self-pay | Admitting: Family Medicine

## 2017-05-12 DIAGNOSIS — N2889 Other specified disorders of kidney and ureter: Secondary | ICD-10-CM

## 2017-05-12 LAB — CULTURE, BLOOD (ROUTINE X 2)
CULTURE: NO GROWTH
Culture: NO GROWTH
Special Requests: ADEQUATE
Special Requests: ADEQUATE

## 2017-05-13 ENCOUNTER — Ambulatory Visit: Payer: 59 | Admitting: Family Medicine

## 2017-05-13 DIAGNOSIS — Z7689 Persons encountering health services in other specified circumstances: Secondary | ICD-10-CM | POA: Diagnosis not present

## 2017-05-13 DIAGNOSIS — Z452 Encounter for adjustment and management of vascular access device: Secondary | ICD-10-CM | POA: Diagnosis not present

## 2017-05-14 ENCOUNTER — Other Ambulatory Visit: Payer: Self-pay

## 2017-05-14 ENCOUNTER — Emergency Department (HOSPITAL_COMMUNITY): Payer: 59

## 2017-05-14 ENCOUNTER — Ambulatory Visit: Payer: 59 | Admitting: Family Medicine

## 2017-05-14 ENCOUNTER — Encounter (HOSPITAL_COMMUNITY): Payer: Self-pay | Admitting: Emergency Medicine

## 2017-05-14 ENCOUNTER — Emergency Department (HOSPITAL_COMMUNITY)
Admission: EM | Admit: 2017-05-14 | Discharge: 2017-05-14 | Disposition: A | Discharge status: Home / Self Care | Payer: 59 | Attending: Emergency Medicine | Admitting: Emergency Medicine

## 2017-05-14 DIAGNOSIS — Y999 Unspecified external cause status: Secondary | ICD-10-CM | POA: Insufficient documentation

## 2017-05-14 DIAGNOSIS — W0110XA Fall on same level from slipping, tripping and stumbling with subsequent striking against unspecified object, initial encounter: Secondary | ICD-10-CM | POA: Diagnosis not present

## 2017-05-14 DIAGNOSIS — M25562 Pain in left knee: Secondary | ICD-10-CM | POA: Diagnosis not present

## 2017-05-14 DIAGNOSIS — E119 Type 2 diabetes mellitus without complications: Secondary | ICD-10-CM | POA: Insufficient documentation

## 2017-05-14 DIAGNOSIS — M25512 Pain in left shoulder: Secondary | ICD-10-CM | POA: Diagnosis not present

## 2017-05-14 DIAGNOSIS — S8992XA Unspecified injury of left lower leg, initial encounter: Secondary | ICD-10-CM | POA: Diagnosis not present

## 2017-05-14 DIAGNOSIS — S43102A Unspecified dislocation of left acromioclavicular joint, initial encounter: Secondary | ICD-10-CM | POA: Diagnosis not present

## 2017-05-14 DIAGNOSIS — Y939 Activity, unspecified: Secondary | ICD-10-CM | POA: Insufficient documentation

## 2017-05-14 DIAGNOSIS — Z79899 Other long term (current) drug therapy: Secondary | ICD-10-CM | POA: Diagnosis not present

## 2017-05-14 DIAGNOSIS — T148XXA Other injury of unspecified body region, initial encounter: Secondary | ICD-10-CM | POA: Diagnosis not present

## 2017-05-14 DIAGNOSIS — I10 Essential (primary) hypertension: Secondary | ICD-10-CM | POA: Diagnosis not present

## 2017-05-14 DIAGNOSIS — S4992XA Unspecified injury of left shoulder and upper arm, initial encounter: Secondary | ICD-10-CM | POA: Diagnosis not present

## 2017-05-14 DIAGNOSIS — W19XXXA Unspecified fall, initial encounter: Secondary | ICD-10-CM

## 2017-05-14 DIAGNOSIS — Y929 Unspecified place or not applicable: Secondary | ICD-10-CM | POA: Diagnosis not present

## 2017-05-14 MED ORDER — HYDROCODONE-ACETAMINOPHEN 5-325 MG PO TABS: 1.0000 | ORAL_TABLET | ORAL | 0 refills | Status: DC | PRN

## 2017-05-14 MED ORDER — HYDROCODONE-ACETAMINOPHEN 5-325 MG PO TABS
1.0000 | ORAL_TABLET | Freq: Once | ORAL | Status: AC
  Administered 2017-05-14: 1 via ORAL
  Filled 2017-05-14: qty 1

## 2017-05-14 MED ORDER — NAPROXEN 500 MG PO TABS: 500.0000 mg | ORAL_TABLET | Freq: Two times a day (BID) | ORAL | 0 refills | Status: DC

## 2017-05-14 MED ORDER — NAPROXEN 500 MG PO TABS
500.0000 mg | ORAL_TABLET | Freq: Once | ORAL | Status: AC
  Administered 2017-05-14: 500 mg via ORAL
  Filled 2017-05-14: qty 1

## 2017-05-14 NOTE — Discharge Instructions (Signed)
1.  Wear your sling for several days. 2.  Once your shoulder is no longer so painful, start some very gentle rotation and back and forth motions of the shoulder to avoid getting severe stiffness of the shoulder joint. 3.  Schedule appointment to see your doctor within the next week.

## 2017-05-14 NOTE — ED Provider Notes (Signed)
[facility] DEPT Provider Note   CSN: [id number] Arrival date & time: 05/14/17  1006     History   Chief Complaint Chief Complaint  Patient presents with  . Shoulder Injury    HPI [name] [name] is a 56 y.o. male.  HPI Patient was walking his dog.  At baseline he has some problems with balance.  He got off balance and fell landing directly on his left shoulder and knee.  Patient reports he does not have much pain in the knee and is able to move it without difficulty.  He however has severe pain in the left shoulder and is unable to move it.  No numbness or tingling.  Eyes any head injury or neck injury.  No chest pain or shortness of breath.  Incidentally, he did break ribs on the right side several weeks ago from a fall.  Patient has complex medical history but no acute complaints regarding other medical issues at this time. Past Medical History:  Diagnosis Date  . Adenomatous colon polyp 10/1999  . Anxiety   . Arthritis    neck, yoga helps.  . Cancer ([place])    skin  . Cataract   . Complex partial seizure ([place])   . Crohn's disease of small and large intestines ([place]) 1999  . Depression   . Diabetes mellitus   . Esophageal stricture   . External hemorrhoids   . GERD (gastroesophageal reflux disease)   . History of kidney stones    3 large stones still present  . History of nuclear stress test 11/15/2010   exercise myoview; normal pattern of perfusion; normal, low risk study   . Hyperlipemia   . Hypertension   . Migraines   . MRSA (methicillin resistant Staphylococcus aureus) 10/2010   . Osteoporosis   . Pneumonia   . PONV (postoperative nausea and vomiting)   . PTSD (post-traumatic stress disorder)   . Seizures ([place])   . Skin cancer    squamous cell multiple; [facility]; followed every 3 months.  . Sleep apnea    CPAP machine, uses nightly    Patient Active Problem List   Diagnosis Date Noted  . Bacteremia due to Staphylococcus aureus  05/06/2017  . Staphylococcus aureus bacteremia 05/06/2017  . Confusion 04/02/2017  . Syncope 08/30/2016  . Depression 04/30/2016  . Memory loss 04/30/2016  . OSA on CPAP 10/31/2015  . Nonepileptic episode ([place]) 04/25/2015  . Seizure ([place]) 04/04/2013  . Abnormal EKG 03/12/2013  . DOE (dyspnea on exertion) 03/12/2013  . Complex partial seizure ([place]) 04/19/2012  . DM2 (diabetes mellitus, type 2) ([place]) 09/25/2011  . Altered mental status 09/25/2011  . OTHER DYSPHAGIA 03/27/2009  . TRANSAMINASES, SERUM, ELEVATED 03/27/2009  . Cough 11/10/2008  . GERD 12/07/2007  . COLONIC POLYPS, ADENOMATOUS, HX OF 12/07/2007  . EXTERNAL HEMORRHOIDS 09/11/2007  . CROHN'S DISEASE, LARGE AND SMALL INTESTINES 09/11/2007  . OSTEOPOROSIS 09/11/2007  . HYPERLIPIDEMIA NEC/NOS 02/27/2007  . Essential hypertension 02/27/2007    Past Surgical History:  Procedure Laterality Date  . CATARACT EXTRACTION Bilateral   . ELBOW SURGERY  2012   elbow MRSA infection   . EYE SURGERY     cataracts removed, /w IOL  . INSERTION OF MESH N/A 07/01/2016   Procedure: INSERTION OF MESH;  Surgeon: [name], MD;  Location: [facility];  Service: General;  Laterality: N/A;  . SHOULDER SURGERY    . SINUS SURGERY WITH INSTATRAK    . TEE WITHOUT CARDIOVERSION N/A 05/09/2017  Procedure: TRANSESOPHAGEAL ECHOCARDIOGRAM (TEE);  Surgeon: [name], MD;  Location: [facility] ENDOSCOPY;  Service: Cardiovascular;  Laterality: N/A;  . TRANSTHORACIC ECHOCARDIOGRAM  02/22/2011   EF 55-65%; increased pattern of LVH with mild conc hypertrophy, abnormal relaxation & increased filling pressure (grade 2 diastolic dysfunction); atrial septum thickened (lipomatous hypertrophy)  . UMBILICAL HERNIA REPAIR N/A 07/01/2016   Procedure: UMBILICAL HERNIA REPAIR WITH MESH;  Surgeon: [name], MD;  Location: [facility];  Service: General;  Laterality: N/A;  . VASECTOMY         Home Medications    Prior to Admission medications   Medication Sig Start  Date End Date Taking? Authorizing Provider  azelastine (ASTELIN) 0.1 % nasal spray Place 2 sprays into both nostrils 2 (two) times daily. Use in each nostril as directed Patient taking differently: Place 2 sprays daily into both nostrils.  05/30/16  Yes Wardell Honour, MD  ceFAZolin (ANCEF) IVPB Inject 2 g every 8 (eight) hours for 23 days into the vein. Indication:  SA bacteremia Last Day of Therapy:  06/02/17 Labs - Once weekly:  CBC/D and BMP, Labs - Every other week:  ESR and CRP 05/10/17 06/02/17 Yes Emokpae, Courage, MD  diltiazem (TIAZAC) 180 MG 24 hr capsule Take 1 capsule (180 mg total) by mouth daily. 10/21/16  Yes Wardell Honour, MD  eletriptan (RELPAX) 40 MG tablet Take 1 tablet at onset of headache, may repeat in 2 hours if headache persists or reoccurs. 01/18/13  Yes Wendie Agreste, MD  esomeprazole (NEXIUM) 40 MG capsule TAKE 1 CAPSULE TWICE A DAY 12/18/16  Yes Ladene Artist, MD  Eszopiclone 3 MG TABS Take 1 tablet (3 mg total) by mouth at bedtime. Take immediately before bedtime 03/21/17  Yes Wardell Honour, MD  folic acid (FOLVITE) 1 MG tablet Take 2 tablets (2 mg total) by mouth daily. 11/19/16  Yes Wardell Honour, MD  lamoTRIgine (LAMICTAL) 100 MG tablet Take 1 tablet (100 mg total) by mouth 2 (two) times daily. 04/25/17  Yes Marcial Pacas, MD  mercaptopurine (PURINETHOL) 50 MG tablet TAKE 2 TABLETS DAILY ON AN EMPTY STOMACH 1 HOUR BEFOREOR 2 HOURS AFTER MEALS     (CAUTION: CHEMOTHERAPY) 02/18/17  Yes Ladene Artist, MD  mesalamine (LIALDA) 1.2 g EC tablet Take 2 tablets (2.4 g total) by mouth daily. Patient taking differently: Take 1.2 g 2 (two) times daily by mouth.  02/18/17  Yes Ladene Artist, MD  Ascorbic Acid (VITAMIN C PO) Take 1 tablet at bedtime by mouth.    [provider]  aspirin EC 81 MG tablet Take 81 mg at bedtime by mouth.     [provider]  Calcium Carb-Cholecalciferol (CALCIUM-VITAMIN D) 500-200 MG-UNIT tablet Take 1 tablet 2 (two) times  daily by mouth.    [provider]  cholecalciferol (VITAMIN D) 1000 units tablet Take 1,000 Units at bedtime by mouth.     [provider]  ferrous sulfate 325 (65 FE) MG tablet Take 325 mg at bedtime by mouth.     [provider]  fluorouracil (EFUDEX) 5 % cream Apply 1 application at bedtime topically. For Basal Cell Carinoma of arms     [provider]  glucose blood (CHOICE DM FORA G20 TEST STRIPS) test strip Use as instructed 11/13/12   [name], MD  HYDROcodone-acetaminophen (NORCO/VICODIN) 5-325 MG tablet Take 1-2 tablets by mouth every 4 (four) hours as needed for moderate pain or severe pain. 05/14/17   Sholom Dulude,  Jeannie Done, MD  Ibuprofen-Diphenhydramine Cit (ADVIL PM PO) Take 1 tablet at bedtime by mouth.     [provider]  Lactase (DAIRY-RELIEF PO) Take 2 tablets by mouth 2 (two) times daily.     [provider]  loratadine (CLARITIN) 10 MG tablet Take 10 mg at bedtime by mouth.     [provider]  methscopolamine (PAMINE FORTE) 5 MG tablet Take 1 tablet (5 mg total) by mouth 2 (two) times daily. 02/18/17   Ladene Artist, MD  naproxen (NAPROSYN) 500 MG tablet Take 1 tablet (500 mg total) by mouth 2 (two) times daily. 05/14/17   Charlesetta Shanks, MD  ONE TOUCH ULTRA TEST test strip USE AS INSTRUCTED    Weber, Damaris Hippo, PA-C  polyethylene glycol (MIRALAX / GLYCOLAX) packet Take 17 g daily by mouth. 05/05/17   Tegeler, Gwenyth Allegra, MD  potassium chloride SA (K-DUR,KLOR-CON) 20 MEQ tablet Take 1 tablet (20 mEq total) by mouth 2 (two) times daily. 08/20/16   Wardell Honour, MD  Probiotic Product (PROBIOTIC PO) Take 1 tablet at bedtime by mouth.     [provider]  sertraline (ZOLOFT) 100 MG tablet Take 200 mg daily with breakfast by mouth.  03/16/17   [provider]  traZODone (DESYREL) 100 MG tablet Take 50-100 mg at bedtime by mouth.  04/01/17   [provider]    Family History Family  History  Problem Relation Age of Onset  . Colon polyps Father   . Stroke Father   . Hyperlipidemia Father   . Hypertension Father   . Heart disease Mother        CABG at age 68  . Hyperlipidemia Mother   . Hypertension Mother   . Stroke Paternal Grandmother   . Stroke Paternal Grandfather   . Other Child        tetrology of fallot (cornealia deland syndrome)  . Colon cancer Neg Hx     Social History Social History   Tobacco Use  . Smoking status: Never Smoker  . Smokeless tobacco: Never Used  Substance Use Topics  . Alcohol use: No  . Drug use: No     Allergies   Penicillins; Sulfonamide derivatives; Azithromycin; Beef-derived products; Milk-related compounds; and Dilaudid [hydromorphone hcl]   Review of Systems Review of Systems 10 Systems reviewed and are negative for acute change except as noted in the HPI.   Physical Exam Updated Vital Signs BP (!) 160/73 (BP Location: Left Leg)   Pulse 84   Temp 98.5 F (36.9 C) (Oral)   Resp 19   Ht 5' 11"  (1.803 m)   Wt 90.7 kg (200 lb)   SpO2 97%   BMI 27.89 kg/m   Physical Exam  Constitutional: He appears well-developed and well-nourished. No distress.  HENT:  Head: Normocephalic and atraumatic.  Eyes: EOM are normal.  Neck: Neck supple.  Cardiovascular: Normal rate, normal heart sounds and intact distal pulses.  Pulmonary/Chest: Effort normal and breath sounds normal. He exhibits no tenderness.  Musculoskeletal:  Patient is left shoulder has mild erythema on the anterior point of the shoulder.  No obvious deformity.  Patient reports severe pain with any range of motion.  Pain localizes over the [facility] joint.  Pain is reproduced by grasping the clavicle medially where it is nontender and creating motion of the clavicle distally.  No bony point tenderness of the upper arm.  Normal distal pulses.  Normal neurologic function.  Neurological: He is alert. No cranial nerve  deficit. He exhibits normal muscle tone.  Coordination normal.  Skin: Skin is warm and dry.  Psychiatric: He has a normal mood and affect.     ED Treatments / Results  Labs (all labs ordered are listed, but only abnormal results are displayed) Labs Reviewed - No data to display  EKG  EKG Interpretation None       Radiology Dg Shoulder Left  Result Date: 05/14/2017 CLINICAL DATA:  Left shoulder pain after fall. EXAM: LEFT SHOULDER - 2+ VIEW COMPARISON:  None. FINDINGS: No acute fracture or malalignment. Moderate acromioclavicular degenerative changes. Diffuse osteopenia. Patchy density at the left lung base. IMPRESSION: 1.  No acute osseous abnormality. 2. Patchy atelectasis versus infiltrate at the left lung base. Consider chest x-ray for further evaluation. Electronically Signed   By: Titus Dubin M.D.   On: 05/14/2017 11:00   Dg Knee Complete 4 Views Left  Result Date: 05/14/2017 CLINICAL DATA:  Left knee pain.  Fall. EXAM: LEFT KNEE - COMPLETE 4+ VIEW COMPARISON:  None. FINDINGS: Early spurring in the left knee joint. No acute bony abnormality. Specifically, no fracture, subluxation, or dislocation. Soft tissues are intact. No joint effusion. IMPRESSION: Early degenerative changes.  No acute findings. Electronically Signed   By: Rolm Baptise M.D.   On: 05/14/2017 10:59    Procedures Procedures (including critical care time)  Medications Ordered in ED Medications - No data to display   Initial Impression / Assessment and Plan / ED Course  I have reviewed the triage vital signs and the nursing notes.  Pertinent labs & imaging results that were available during my care of the patient were reviewed by me and considered in my medical decision making (see chart for details).     Final Clinical Impressions(s) / ED Diagnoses   Final diagnoses:  Fall, initial encounter  AC separation, left, initial encounter  Patient has complex medical history as comorbid illness.  At this time however no acute problems  regarding that portion of his history.  Patient had a mechanical fall from imbalance while walking his dog.  He has severe pain in the left shoulder.  X-rays are negative for dislocation or fracture.  Physical examination very much localizes to the [facility] joint.  I suspect AC separation.  Patient is otherwise neurovascularly intact.  Patient will take naproxen for pain and Vicodin as needed.  Plan will be for temporary sling.  Patient and wife are counseled on starting some early range of motion to avoid frozen shoulder and follow-up with PCP.  ED Discharge Orders        Ordered    naproxen (NAPROSYN) 500 MG tablet  2 times daily     05/14/17 1147    HYDROcodone-acetaminophen (NORCO/VICODIN) 5-325 MG tablet  Every 4 hours PRN     05/14/17 1147       Charlesetta Shanks, MD 05/14/17 1153

## 2017-05-14 NOTE — Progress Notes (Signed)
[facility]  Active pt with [facility] for home IV ABX. [facility] hospital team will follow pt while in the ED to support transition back home.  If patient discharges after hours, please call [phone].   [name] [name] 05/14/2017, 11:15 AM
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
## 2017-05-19 ENCOUNTER — Telehealth: Payer: Self-pay | Admitting: Family Medicine

## 2017-05-19 DIAGNOSIS — K50012 Crohn's disease of small intestine with intestinal obstruction: Secondary | ICD-10-CM | POA: Diagnosis not present

## 2017-05-19 DIAGNOSIS — Z452 Encounter for adjustment and management of vascular access device: Secondary | ICD-10-CM | POA: Diagnosis not present

## 2017-05-19 NOTE — Telephone Encounter (Signed)
Copied from [place]. Topic: Quick Communication - See Telephone Encounter >> May 19, 2017 11:11 AM Ether Griffins B wrote: CRM for notification. See Telephone encounter for:  Chong Sicilian is [facility] calling wanting orders for PT and OT for evaluation. Pt took a fall and dislocated his shoulder, scraped his knee, and broke several ribs. Patty's call back number is [phone] 05/19/17.

## 2017-05-20 DIAGNOSIS — G4733 Obstructive sleep apnea (adult) (pediatric): Secondary | ICD-10-CM | POA: Diagnosis not present

## 2017-05-22 NOTE — Telephone Encounter (Signed)
Approval for PT and OT provided to [facility] of [facility].

## 2017-05-23 ENCOUNTER — Other Ambulatory Visit: Payer: Self-pay | Admitting: Family Medicine

## 2017-05-23 ENCOUNTER — Other Ambulatory Visit: Payer: Self-pay

## 2017-05-23 ENCOUNTER — Telehealth: Payer: Self-pay | Admitting: Family Medicine

## 2017-05-23 ENCOUNTER — Encounter: Payer: Self-pay | Admitting: Family Medicine

## 2017-05-23 ENCOUNTER — Ambulatory Visit (HOSPITAL_BASED_OUTPATIENT_CLINIC_OR_DEPARTMENT_OTHER)
Admission: RE | Admit: 2017-05-23 | Discharge: 2017-05-23 | Disposition: A | Discharge status: Home / Self Care | Payer: 59 | Source: Ambulatory Visit | Attending: Family Medicine | Admitting: Family Medicine

## 2017-05-23 ENCOUNTER — Ambulatory Visit (INDEPENDENT_AMBULATORY_CARE_PROVIDER_SITE_OTHER): Payer: 59 | Admitting: Family Medicine

## 2017-05-23 VITALS — BP 128/70 | HR 80 | Temp 97.9°F | Resp 16 | Ht 70.87 in | Wt 200.0 lb

## 2017-05-23 DIAGNOSIS — R7881 Bacteremia: Secondary | ICD-10-CM | POA: Diagnosis not present

## 2017-05-23 DIAGNOSIS — R609 Edema, unspecified: Secondary | ICD-10-CM | POA: Insufficient documentation

## 2017-05-23 DIAGNOSIS — N390 Urinary tract infection, site not specified: Secondary | ICD-10-CM | POA: Diagnosis not present

## 2017-05-23 DIAGNOSIS — E119 Type 2 diabetes mellitus without complications: Secondary | ICD-10-CM

## 2017-05-23 DIAGNOSIS — R310 Gross hematuria: Secondary | ICD-10-CM | POA: Diagnosis not present

## 2017-05-23 DIAGNOSIS — S8002XA Contusion of left knee, initial encounter: Secondary | ICD-10-CM

## 2017-05-23 DIAGNOSIS — N2889 Other specified disorders of kidney and ureter: Secondary | ICD-10-CM

## 2017-05-23 DIAGNOSIS — F321 Major depressive disorder, single episode, moderate: Secondary | ICD-10-CM

## 2017-05-23 DIAGNOSIS — B9561 Methicillin susceptible Staphylococcus aureus infection as the cause of diseases classified elsewhere: Secondary | ICD-10-CM

## 2017-05-23 DIAGNOSIS — S8012XA Contusion of left lower leg, initial encounter: Secondary | ICD-10-CM | POA: Diagnosis not present

## 2017-05-23 DIAGNOSIS — M7989 Other specified soft tissue disorders: Secondary | ICD-10-CM | POA: Diagnosis not present

## 2017-05-23 DIAGNOSIS — S40012A Contusion of left shoulder, initial encounter: Secondary | ICD-10-CM | POA: Diagnosis not present

## 2017-05-23 DIAGNOSIS — Z452 Encounter for adjustment and management of vascular access device: Secondary | ICD-10-CM | POA: Diagnosis not present

## 2017-05-23 DIAGNOSIS — S80212A Abrasion, left knee, initial encounter: Secondary | ICD-10-CM | POA: Diagnosis not present

## 2017-05-23 LAB — POCT URINALYSIS DIP (MANUAL ENTRY)
Bilirubin, UA: NEGATIVE
Glucose, UA: NEGATIVE mg/dL
Nitrite, UA: NEGATIVE
SPEC GRAV UA: 1.025 (ref 1.010–1.025)
UROBILINOGEN UA: 1 U/dL
pH, UA: 6.5 (ref 5.0–8.0)

## 2017-05-23 LAB — POCT GLYCOSYLATED HEMOGLOBIN (HGB A1C): Hemoglobin A1C: 4.6

## 2017-05-23 LAB — GLUCOSE, POCT (MANUAL RESULT ENTRY): POC Glucose: 98 mg/dl (ref 70–99)

## 2017-05-23 NOTE — Telephone Encounter (Signed)
Pt's spouse called in to follow up on request. I advised of current status. If possible, please call pt when sent to a pharmacy.    Pt would like to make sure that Rx is sent to [facility].

## 2017-05-23 NOTE — Telephone Encounter (Signed)
See message from [place]  Pt needs meds for anxiety for CT tomorrow. Sent to Dr. Tamala Julian

## 2017-05-23 NOTE — Progress Notes (Signed)
Subjective:    Patient ID: [name] [name], male    DOB: June 20, 1961, 56 y.o.   MRN: [id number]  05/23/2017  Hospitalization Follow-up and Chronic Conditions     HPI This 56 y.o. male presents for HOSPITAL FOLLOW-UP FOR sepsis, bacteremia.    Admit date: 05/06/2017 Discharge date: 05/11/2017  Admitted From: Home Disposition: Home  Recommendations for Outpatient Follow-up:  1. Follow up with PCP in 1-2 weeks 2. BMP and CBC every week while on antibiotic 3. F/u with Dr. Tommy Medal in 2-3 weeks 4. Please follow up on the following pending results:  Home Health: Yes RN Equipment/Devices: PICC line  Discharge Condition: Stable CODE STATUS: Full code Diet recommendation: Carb modified heart healthy  Brief/Interim Summary: [name] [name] a 56 y.o.malewith medical history significant ofCrohn's disease involving small and large intestines, seizure disorder, arthritis, diabetes, depression, hypertension, hyperlipidemia who was recently seen at [facility] for abdominal pains. Patient underwent CT abdomen pelvis which had findings concerning for constipation. Patient was given a bowel regimen with market improvement of his abdominal discomfort. An incidental 2.3 x 2.3 cm lower pole lesion of decreased attenuation was noted. Recommendations were for pre-and postcontrast MRI or CT. During that visit, patient was found to have a urinalysis suggestive of a urinary tract infection with pan cultures obtained. Patient was discharged from the emergency department with levofloxacin. On the day of hospital admission, patient was informed that his blood culture was positive for staph aureus and to present to the emergency department.  ED Course:The emergency department, patient was found to have a white blood count of 6.2. Patient was afebrile. Repeat blood cultures were obtained, currently pending. ED physician has started the patient on empiric Ancef after discussion with  pharmacy. Infectious disease was consulted. Hospitalist service was consulted for consideration for admission  Patient was found to have MSSA bacteremia and infectious disease was consulted. Patient underwent TTE on 05/07/2017.He was scheduled for TEE on 05/09/2017.   Discharge Diagnoses:  Principal Problem:   Bacteremia due to Staphylococcus aureus Active Problems:   Essential hypertension   CROHN'S DISEASE, LARGE AND SMALL INTESTINES   DM2 (diabetes mellitus, type 2) (HCC)   OSA on CPAP   Staphylococcus aureus bacteremia  Doing moderately well since hospital discharge. Did suffer another fall after discharge which has complicated post-hospital course.  Compliance with iv abx three times daily; [facility] RN visits every Monday for labs.  Sees Dr. Tommy Medal next week.  PICC line three times per day.    Fall: just discharged on 05/11/17 and feeling better.  [place] again while walking outside.  Was walking the dog; dog walking fast.  Slipped on leaves.  Landed on L shoulder ten days ago.  Shoulder is hurting more now.  S/p xray in ED.  L anterior abrasions L knee.  Bruising lower leg; now better.  Swelling worsened.  Less swelling.  This calf was one inch larger; 14cm and 15cm larger. Has a sling. Wearing sling most of the time due to severity of L shoulder pain; constant pain in L shoulder; in joint.  Fell forward.    Kidney lesion: needs MRI for kidney lesion identified with CT abdomen with ED visit.  Waiting for appointment time.  Has been suffering with hematuria and foul odor to urine for weeks despite iv antibiotics.   Unsteady gait: worsening; falling constantly; afraid to leave him.  [facility] is recommending OT and PT; approved yesterday.    [place] appointment in two weeks for follow-up;  urine still really dark and with foul odor.    Depression is quite severe; still having SI yet no specific plan. Has established with local psychiatrist. Trazodone 74m added to regimen.  Has upcoming  appointment next week.  Sugars treated aggressively during admission; did suffer with some low sugars.    Has undergone several skin resections by dermatology; had Moh's surgery recently as well.    BP Readings from Last 3 Encounters:  05/23/17 128/70  05/14/17 (!) 134/59  05/11/17 (!) 113/43   Wt Readings from Last 3 Encounters:  05/23/17 200 lb (90.7 kg)  05/14/17 200 lb (90.7 kg)  05/09/17 202 lb (91.6 kg)   Immunization History  Administered Date(s) Administered  . Influenza Split 03/24/2013, 03/24/2014, 03/26/2016  . Influenza Whole 03/24/2012  . Influenza,inj,Quad PF,6+ Mos 02/16/2015  . Influenza-Unspecified 04/15/2017  . Pneumococcal Conjugate-13 07/07/2014  . Pneumococcal Polysaccharide-23 06/08/2007  . Tdap 05/15/2016  . Zoster 08/18/2014    Review of Systems  Constitutional: Positive for fatigue and fever. Negative for activity change, appetite change, chills and diaphoresis.  Respiratory: Negative for cough and shortness of breath.   Cardiovascular: Negative for chest pain, palpitations and leg swelling.  Gastrointestinal: Negative for abdominal pain, diarrhea, nausea and vomiting.  Endocrine: Negative for cold intolerance, heat intolerance, polydipsia, polyphagia and polyuria.  Genitourinary: Positive for frequency, hematuria and urgency. Negative for decreased urine volume, dysuria, flank pain and testicular pain.  Musculoskeletal: Positive for arthralgias, gait problem and myalgias.  Skin: Negative for color change, rash and wound.  Neurological: Negative for dizziness, tremors, seizures, syncope, facial asymmetry, speech difficulty, weakness, light-headedness, numbness and headaches.  Hematological: Bruises/bleeds easily.  Psychiatric/Behavioral: Negative for dysphoric mood and sleep disturbance. The patient is not nervous/anxious.     Past Medical History:  Diagnosis Date  . Adenomatous colon polyp 10/1999  . Anxiety   . Arthritis    neck, yoga helps.    . Cancer ([place]    skin  . Cataract   . Complex partial seizure ([place]   . Crohn's disease of small and large intestines ([place] 1999  . Depression   . Diabetes mellitus   . Esophageal stricture   . External hemorrhoids   . GERD (gastroesophageal reflux disease)   . History of kidney stones    3 large stones still present  . History of nuclear stress test 11/15/2010   exercise myoview; normal pattern of perfusion; normal, low risk study   . Hyperlipemia   . Hypertension   . Migraines   . MRSA (methicillin resistant Staphylococcus aureus) 10/2010   . Osteoporosis   . Pneumonia   . PONV (postoperative nausea and vomiting)   . PTSD (post-traumatic stress disorder)   . Seizures ([place]   . Skin cancer    squamous cell multiple; [place]; followed every 3 months.  . Sleep apnea    CPAP machine, uses nightly   Past Surgical History:  Procedure Laterality Date  . CATARACT EXTRACTION Bilateral   . ELBOW SURGERY  2012   elbow MRSA infection   . EYE SURGERY     cataracts removed, /w IOL  . INSERTION OF MESH N/A 07/01/2016   Procedure: INSERTION OF MESH;  Surgeon: TJackolyn Confer MD;  [place]  Service: General;  Laterality: N/A;  . SHOULDER SURGERY    . SINUS SURGERY WITH INSTATRAK    . TEE WITHOUT CARDIOVERSION N/A 05/09/2017   Procedure: TRANSESOPHAGEAL ECHOCARDIOGRAM (TEE);  Surgeon: SJerline Pain MD;  [facility];  Service:  Cardiovascular;  Laterality: N/A;  . TRANSTHORACIC ECHOCARDIOGRAM  02/22/2011   EF 55-65%; increased pattern of LVH with mild conc hypertrophy, abnormal relaxation & increased filling pressure (grade 2 diastolic dysfunction); atrial septum thickened (lipomatous hypertrophy)  . UMBILICAL HERNIA REPAIR N/A 07/01/2016   Procedure: UMBILICAL HERNIA REPAIR WITH MESH;  Surgeon: [name], MD;  Location: [place];  Service: General;  Laterality: N/A;  . VASECTOMY     Allergies  Allergen Reactions  . Penicillins Hives    Rash as child- tolerates  Ancef Has patient had a PCN reaction causing immediate rash, facial/tongue/throat swelling, SOB or lightheadedness with hypotension: YES Has patient had a PCN reaction causing severe rash involving mucus membranes or skin necrosis: Unknown Has patient had a PCN reaction that required hospitalization: Unknown Has patient had a PCN reaction occurring within the last 10 years:Unknown If all of the above answers are "NO", then may proceed with Cephalosporin use.    . Sulfonamide Derivatives Hives  . Azithromycin Swelling    SWELLING REACTION UNSPECIFIED   . Beef-Derived Products Diarrhea    RED MEAT > UNSPECIFIED REACTION   . Milk-Related Compounds Diarrhea    DAIRY >UNSPECIFIED REACTION   . Dilaudid [Hydromorphone Hcl] Nausea And Vomiting   Current Outpatient Medications on File Prior to Visit  Medication Sig Dispense Refill  . Ascorbic Acid (VITAMIN C PO) Take 1 tablet at bedtime by mouth.    Marland Kitchen aspirin EC 81 MG tablet Take 81 mg at bedtime by mouth.     Marland Kitchen azelastine (ASTELIN) 0.1 % nasal spray Place 2 sprays into both nostrils 2 (two) times daily. Use in each nostril as directed (Patient taking differently: Place 2 sprays daily into both nostrils. ) 90 mL 3  . Calcium Carb-Cholecalciferol (CALCIUM-VITAMIN D) 500-200 MG-UNIT tablet Take 1 tablet 2 (two) times daily by mouth.    Marland Kitchen ceFAZolin (ANCEF) IVPB Inject 2 g every 8 (eight) hours for 23 days into the vein. Indication:  SA bacteremia Last Day of Therapy:  06/02/17 Labs - Once weekly:  CBC/D and BMP, Labs - Every other week:  ESR and CRP 68 Units 0  . cholecalciferol (VITAMIN D) 1000 units tablet Take 1,000 Units at bedtime by mouth.     . diltiazem (TIAZAC) 180 MG 24 hr capsule Take 1 capsule (180 mg total) by mouth daily. 90 capsule 3  . eletriptan (RELPAX) 40 MG tablet Take 1 tablet at onset of headache, may repeat in 2 hours if headache persists or reoccurs. 12 tablet 4  . esomeprazole (NEXIUM) 40 MG capsule TAKE 1 CAPSULE TWICE A  DAY 180 capsule 1  . Eszopiclone 3 MG TABS Take 1 tablet (3 mg total) by mouth at bedtime. Take immediately before bedtime 30 tablet 1  . ferrous sulfate 325 (65 FE) MG tablet Take 325 mg at bedtime by mouth.     . fluorouracil (EFUDEX) 5 % cream Apply 1 application at bedtime topically. For Basal Cell Carinoma of arms     . folic acid (FOLVITE) 1 MG tablet Take 2 tablets (2 mg total) by mouth daily. 180 tablet 3  . glucose blood (CHOICE DM FORA G20 TEST STRIPS) test strip Use as instructed 100 each 12  . HYDROcodone-acetaminophen (NORCO/VICODIN) 5-325 MG tablet Take 1-2 tablets by mouth every 4 (four) hours as needed for moderate pain or severe pain. 20 tablet 0  . Ibuprofen-Diphenhydramine Cit (ADVIL PM PO) Take 1 tablet at bedtime by mouth.     . Lactase (DAIRY-RELIEF PO)  Take 2 tablets by mouth 2 (two) times daily.     Marland Kitchen lamoTRIgine (LAMICTAL) 100 MG tablet Take 1 tablet (100 mg total) by mouth 2 (two) times daily. 180 tablet 3  . loratadine (CLARITIN) 10 MG tablet Take 10 mg at bedtime by mouth.     . mercaptopurine (PURINETHOL) 50 MG tablet TAKE 2 TABLETS DAILY ON AN EMPTY STOMACH 1 HOUR BEFOREOR 2 HOURS AFTER MEALS     (CAUTION: CHEMOTHERAPY) 180 tablet 1  . mesalamine (LIALDA) 1.2 g EC tablet Take 2 tablets (2.4 g total) by mouth daily. (Patient taking differently: Take 1.2 g 2 (two) times daily by mouth. ) 180 tablet 1  . methscopolamine (PAMINE FORTE) 5 MG tablet Take 1 tablet (5 mg total) by mouth 2 (two) times daily. 180 tablet 1  . naproxen (NAPROSYN) 500 MG tablet Take 1 tablet (500 mg total) by mouth 2 (two) times daily. 30 tablet 0  . ONE TOUCH ULTRA TEST test strip USE AS INSTRUCTED 100 each 2  . polyethylene glycol (MIRALAX / GLYCOLAX) packet Take 17 g daily by mouth. 14 each 0  . potassium chloride SA (K-DUR,KLOR-CON) 20 MEQ tablet Take 1 tablet (20 mEq total) by mouth 2 (two) times daily. 180 tablet 3  . Probiotic Product (PROBIOTIC PO) Take 1 tablet at bedtime by mouth.     .  sertraline (ZOLOFT) 100 MG tablet Take 200 mg daily with breakfast by mouth.     . traZODone (DESYREL) 100 MG tablet Take 50-100 mg at bedtime by mouth.      Current Facility-Administered Medications on File Prior to Visit  Medication Dose [name] Frequency Provider Last Rate Last Dose  . gadopentetate dimeglumine (MAGNEVIST) injection 20 mL  20 mL Intravenous Once PRN Marcial Pacas, MD       Social History   Socioeconomic History  . Marital status: Married    Spouse name: [name]  . Number of children: 2  . Years of education: college  . Highest education level: Not on file  Social Needs  . Financial resource strain: Not on file  . Food insecurity - worry: Not on file  . Food insecurity - inability: Not on file  . Transportation needs - medical: Not on file  . Transportation needs - non-medical: Not on file  Occupational History  . Occupation: disabled    Comment: seizure disorder with memory impairment  Tobacco Use  . Smoking status: Never Smoker  . Smokeless tobacco: Never Used  Substance and Sexual Activity  . Alcohol use: No  . Drug use: No  . Sexual activity: Yes    Birth control/protection: Surgical    Comment: 1 partner in last 12 months  Other Topics Concern  . Not on file  Social History Narrative   Marital status: married x 32 years;       Lives:   lives at home with his family.      Children:  two children; no grandchildren.  Son is special needs at age 82yo; Cornelian Delaine with TOF.       Employment: unemployed; [employer] long term disability      Tobacco: none      Alcohol: none      Drugs: none      Exercise: unable to exercise due to frequent falls   Patient has a college education.   Patient is right-handed.   Patient drinks one cup of soda daily.   Family History  Problem Relation Age of Onset  .  Colon polyps Father   . Stroke Father   . Hyperlipidemia Father   . Hypertension Father   . Heart disease Mother        CABG at age 63  .  Hyperlipidemia Mother   . Hypertension Mother   . Stroke Paternal Grandmother   . Stroke Paternal Grandfather   . Other Child        tetrology of fallot (cornealia deland syndrome)  . Colon cancer Neg Hx        Objective:    BP 128/70   Pulse 80   Temp 97.9 F (36.6 C) (Oral)   Resp 16   Ht 5' 10.87" (1.8 m)   Wt 200 lb (90.7 kg)   SpO2 98%   BMI 28.00 kg/m  Physical Exam  Constitutional: He is oriented to person, place, and time. He appears well-developed and well-nourished. No distress.  HENT:  Head: Normocephalic and atraumatic.  Right Ear: External ear normal.  Left Ear: External ear normal.  Nose: Nose normal.  Mouth/Throat: Oropharynx is clear and moist.  Eyes: Conjunctivae and EOM are normal. Pupils are equal, round, and reactive to light.  Neck: Normal range of motion. Neck supple. Carotid bruit is not present. No thyromegaly present.  Cardiovascular: Normal rate, regular rhythm, normal heart sounds and intact distal pulses. Exam reveals no gallop and no friction rub.  No murmur heard. Pulmonary/Chest: Effort normal and breath sounds normal. He has no wheezes. He has no rales.  Abdominal: Soft. Bowel sounds are normal. He exhibits no distension and no mass. There is no tenderness. There is no rebound and no guarding.  Musculoskeletal:       Left shoulder: He exhibits decreased range of motion, pain and decreased strength. He exhibits no tenderness, no bony tenderness, no spasm and normal pulse.       Left knee: He exhibits normal range of motion, no swelling, no laceration, no erythema and no bony tenderness. No tenderness found. No medial joint line, no lateral joint line and no patellar tendon tenderness noted.       Left lower leg: He exhibits swelling. He exhibits no tenderness, no bony tenderness, no edema, no deformity and no laceration.       Legs:      Left foot: Normal. There is normal range of motion.  L Knee: abrasion anterior knee without erythema; no  streaking.  No drainage.  FULL ROM without pain or limitation; no swelling or effusion.  L lower leg: diffuse ecchymoses with mild swelling diffusely of L calf.  Hommen's negative.  Lymphadenopathy:    He has no cervical adenopathy.  Neurological: He is alert and oriented to person, place, and time. No cranial nerve deficit.  Skin: Skin is warm and dry. No rash noted. He is not diaphoretic.  Psychiatric: He has a normal mood and affect. His behavior is normal.  Nursing note and vitals reviewed.  US Venous Img Lower Bilateral  Result Date: 05/23/2017 CLINICAL DATA:  56 year old male with a history of swelling EXAM: BILATERAL LOWER EXTREMITY VENOUS DOPPLER ULTRASOUND TECHNIQUE: Gray-scale sonography with graded compression, as well as color Doppler and duplex ultrasound were performed to evaluate the lower extremity deep venous systems from the level of the common femoral vein and including the common femoral, femoral, profunda femoral, popliteal and calf veins including the posterior tibial, peroneal and gastrocnemius veins when visible. The superficial great saphenous vein was also interrogated. Spectral Doppler was utilized to evaluate flow at rest and with distal  augmentation maneuvers in the common femoral, femoral and popliteal veins. COMPARISON:  None. FINDINGS: RIGHT LOWER EXTREMITY Common Femoral Vein: No evidence of thrombus. Normal compressibility, respiratory phasicity and response to augmentation. Saphenofemoral Junction: No evidence of thrombus. Normal compressibility and flow on color Doppler imaging. Profunda Femoral Vein: No evidence of thrombus. Normal compressibility and flow on color Doppler imaging. Femoral Vein: No evidence of thrombus. Normal compressibility, respiratory phasicity and response to augmentation. Popliteal Vein: No evidence of thrombus. Normal compressibility, respiratory phasicity and response to augmentation. Calf Veins: No evidence of thrombus. Normal  compressibility and flow on color Doppler imaging. Superficial Great Saphenous Vein: No evidence of thrombus. Normal compressibility and flow on color Doppler imaging. Other Findings:  None. LEFT LOWER EXTREMITY Common Femoral Vein: No evidence of thrombus. Normal compressibility, respiratory phasicity and response to augmentation. Saphenofemoral Junction: No evidence of thrombus. Normal compressibility and flow on color Doppler imaging. Profunda Femoral Vein: No evidence of thrombus. Normal compressibility and flow on color Doppler imaging. Femoral Vein: No evidence of thrombus. Normal compressibility, respiratory phasicity and response to augmentation. Popliteal Vein: No evidence of thrombus. Normal compressibility, respiratory phasicity and response to augmentation. Calf Veins: No evidence of thrombus. Normal compressibility and flow on color Doppler imaging. Superficial Great Saphenous Vein: No evidence of thrombus. Normal compressibility and flow on color Doppler imaging. Other Findings:  None. IMPRESSION: Sonographic survey of the bilateral lower extremities negative for DVT Electronically Signed   By: Corrie Mckusick D.O.   On: 05/23/2017 12:43   Depression screen [facility] 2/9 05/23/2017 02/11/2017 11/19/2016 08/20/2016 05/15/2016  Decreased Interest 3 1 0 3 2  Down, Depressed, Hopeless 3 1 0 3 3  PHQ - 2 Score 6 2 0 6 5  Altered sleeping - 0 - 3 3  Tired, decreased energy 1 1 - 1 1  Change in appetite 1 1 - 3 2  Feeling bad or failure about yourself  0 1 - 3 3  Trouble concentrating 0 1 - 3 3  Moving slowly or fidgety/restless 0 0 - 3 3  Suicidal thoughts 0 0 - 3 3  PHQ-9 Score - 6 - 25 23  Difficult doing work/chores - - - Very difficult -  Some recent data might be hidden   Fall Risk  05/23/2017 02/11/2017 11/19/2016 08/20/2016 05/15/2016  Falls in the past year? Yes Yes Yes No Yes  Number falls in past yr: 2 or more 2 or more 2 or more - 2 or more  Injury with Fall? Yes No Yes - Yes  Comment - - -  - fracture rib #ll 05/14/16  Risk Factor Category  High Fall Risk High Fall Risk - - -        Assessment & Plan:   1. Bacteremia due to Staphylococcus aureus   2. Contusion of left shoulder, initial encounter   3. Gross hematuria   4. Type 2 diabetes mellitus without complication, without long-term current use of insulin ([place])   5. Contusion of left knee and lower leg, initial encounter   6. Current moderate episode of major depressive disorder without prior episode ([place])   7. Abrasion, left knee, initial encounter    New onset of staph aureus bacteremia.  Status post admission for IV antibiotic therapy.  Status post ID consultation during admission.  Discharged on IV antibiotics for 2 weeks via PICC line.  Nursing visiting home weekly.  Follow-up with Dr. Drucilla Schmidt of infectious disease next week.  Which improved since hospital discharge however still  running low-grade fevers.  Also still having urinary symptoms including foul odor to urine and urinary frequency with hematuria.  Hematuria with foul odor to urine.  Has follow-up with Dr. Karsten Ro of urology next week.  Obtain urine with urine culture.  Also has lesion on kidney that was detected on CT scan during ED visit.  MRI of abdomen has been scheduled and will check the status of this imaging study to evaluate this lesion further.  New onset left shoulder contusion.  Status post left shoulder x-ray at ED 10 days ago.  Pain is worsening in the left shoulder.  Refer to orthopedics at this time.  Encourage patient use hydrocodone for pain management.  Diabetes mellitus type 2 is well controlled.  Obtain labs.  Monitor sugars closely with acute infection.  Patient is established with a local psychiatrist for severe major depressive disorder.  Having intermittent suicidal ideations however no active plan.  Excellent family support with life.  New left knee abrasion secondary to recent fall.  No evidence of secondary infection.  Local wound  wound care recommended.  Patient also suffering with swelling and contusions of the left calf and lower leg.  Homans sign is negative however will refer for lower extremity Doppler to rule out DVT considering recent hospitalization and sedentary lifestyle since discharge.  -prolonged face-to-face for 40 minutes with greater than 50% of time dedicated to counseling and coordination of care.   Orders Placed This Encounter  Procedures  . Urine Culture  . Urine Microscopic  . Microalbumin / creatinine urine ratio  . Ambulatory referral to Orthopedic Surgery    Referral Priority:   Routine    Referral Type:   Surgical    Referral Reason:   Specialty Services Required    Requested Specialty:   Orthopedic Surgery    Number of Visits Requested:   1  . POCT urinalysis dipstick  . POCT glucose (manual entry)  . POCT glycosylated hemoglobin (Hb A1C)   No orders of the defined types were placed in this encounter.   Return in about 4 weeks (around 06/20/2017) for follow-up chronic medical conditions.   Aahna Rossa Elayne Guerin, M.D. Primary Care at [facility] previously [facility] [place] [address] [place], [place]  [address] [phone] phone [phone] fax

## 2017-05-23 NOTE — Telephone Encounter (Signed)
Pt has appointment scheduled at [facility]. Verified with facility.

## 2017-05-23 NOTE — Telephone Encounter (Signed)
Copied from [place] [id number]. Topic: Quick Communication - See Telephone Encounter >> May 23, 2017 12:39 PM Percell Belt A wrote: CRM for notification. See Telephone encounter for:  Hoyle Sauer from [facility] called in and said that pt is there now wanting meds to help calm him down for CT tomorrow?  Please contact pt if you are able to send in meds  05/23/17.

## 2017-05-23 NOTE — Telephone Encounter (Signed)
Copied from [place]. Topic: Quick Communication - See Telephone Encounter >> May 23, 2017 10:07 AM Cleaster Corin, NT wrote: CRM for notification. See Telephone encounter for:   05/23/17. Phylisa  from [facility] returning call from [place]  said they do not have any appts for stat doppler will have to call hospital. Any questions you can give phylisa a call at [phone]

## 2017-05-23 NOTE — Patient Instructions (Signed)
     IF you received an x-ray today, you will receive an invoice from [facility]. Please contact [facility] at [phone] with questions or concerns regarding your invoice.   IF you received labwork today, you will receive an invoice from [facility]. Please contact [facility] at [phone] with questions or concerns regarding your invoice.   Our billing staff will not be able to assist you with questions regarding bills from these companies.  You will be contacted with the lab results as soon as they are available. The fastest way to get your results is to activate your My Chart account. Instructions are located on the last page of this paperwork. If you have not heard from us regarding the results in 2 weeks, please contact this office.     

## 2017-05-24 ENCOUNTER — Ambulatory Visit (HOSPITAL_BASED_OUTPATIENT_CLINIC_OR_DEPARTMENT_OTHER)
Admission: RE | Admit: 2017-05-24 | Discharge: 2017-05-24 | Disposition: A | Discharge status: Home / Self Care | Payer: 59 | Source: Ambulatory Visit | Attending: Family Medicine | Admitting: Family Medicine

## 2017-05-24 DIAGNOSIS — Z452 Encounter for adjustment and management of vascular access device: Secondary | ICD-10-CM | POA: Diagnosis not present

## 2017-05-24 DIAGNOSIS — B9561 Methicillin susceptible Staphylococcus aureus infection as the cause of diseases classified elsewhere: Secondary | ICD-10-CM | POA: Diagnosis not present

## 2017-05-24 DIAGNOSIS — N2889 Other specified disorders of kidney and ureter: Secondary | ICD-10-CM | POA: Diagnosis present

## 2017-05-24 DIAGNOSIS — N390 Urinary tract infection, site not specified: Secondary | ICD-10-CM | POA: Diagnosis not present

## 2017-05-24 LAB — MICROALBUMIN / CREATININE URINE RATIO
CREATININE, UR: 155.6 mg/dL
MICROALBUM., U, RANDOM: 180.9 ug/mL
Microalb/Creat Ratio: 116.3 mg/g creat — ABNORMAL HIGH (ref 0.0–30.0)

## 2017-05-24 LAB — URINALYSIS, MICROSCOPIC ONLY
Casts: NONE SEEN /lpf
Epithelial Cells (non renal): NONE SEEN /hpf (ref 0–10)

## 2017-05-24 LAB — URINE CULTURE: ORGANISM ID, BACTERIA: NO GROWTH

## 2017-05-24 MED ORDER — GADOBENATE DIMEGLUMINE 529 MG/ML IV SOLN
20.0000 mL | Freq: Once | INTRAVENOUS | Status: AC | PRN
  Administered 2017-05-24: 18 mL via INTRAVENOUS

## 2017-05-24 MED ORDER — ALPRAZOLAM 0.5 MG PO TBDP: 0.5000 mg | ORAL_TABLET | Freq: Once | ORAL | 0 refills | Status: AC

## 2017-05-24 NOTE — Telephone Encounter (Signed)
Sent Xanax 0.5-1.59m one hour prior to procedure.  Pt advised by CNA SDierdre Searles

## 2017-05-26 ENCOUNTER — Ambulatory Visit: Payer: 59 | Admitting: Family Medicine

## 2017-05-26 ENCOUNTER — Encounter: Payer: Self-pay | Admitting: Family Medicine

## 2017-05-26 ENCOUNTER — Telehealth: Payer: Self-pay | Admitting: Family Medicine

## 2017-05-26 DIAGNOSIS — M25512 Pain in left shoulder: Secondary | ICD-10-CM | POA: Diagnosis not present

## 2017-05-26 DIAGNOSIS — N390 Urinary tract infection, site not specified: Secondary | ICD-10-CM | POA: Diagnosis not present

## 2017-05-26 DIAGNOSIS — M542 Cervicalgia: Secondary | ICD-10-CM | POA: Diagnosis not present

## 2017-05-26 DIAGNOSIS — Z452 Encounter for adjustment and management of vascular access device: Secondary | ICD-10-CM | POA: Diagnosis not present

## 2017-05-26 DIAGNOSIS — B9561 Methicillin susceptible Staphylococcus aureus infection as the cause of diseases classified elsewhere: Secondary | ICD-10-CM | POA: Diagnosis not present

## 2017-05-26 NOTE — Telephone Encounter (Signed)
[name] [name], [facility] calling to report a fall this morning around 0930. Pt states with this fall he has experienced increased neck pain with limited movement as well as left shoulder pain. Pt currently rating pain 7 on a 1-10 scale. Pt's vital signs stable according to physical therapist with current BP 150/84, but orthostatic hypotension was noted with a 15pt drop in the systolic number. Pt has a history of falls and states he is having some dizziness.Pt has a scheduled visit today with orthopedic doctor regarding this fall. Pt states that he has Noroc and Percocet in the home and wants to know which one he is ok to take for pain. Pt would like to contacted via his wife, [name] and her number is [phone].

## 2017-05-26 NOTE — Telephone Encounter (Signed)
Copied from [place]. Topic: Quick Communication - See Telephone Encounter >> May 26, 2017  2:21 PM Oneta Rack wrote: CRM for notification. See Telephone encounter for:   05/26/17.  Caller name:  Mauri Reading Relation to pt: OT from [facility]  Call back number: [phone]  Pharmacy:  Reason for call: Requesting verbal orders 1x 4

## 2017-05-27 ENCOUNTER — Other Ambulatory Visit: Payer: Self-pay | Admitting: Family Medicine

## 2017-05-27 ENCOUNTER — Other Ambulatory Visit: Payer: Self-pay | Admitting: Gastroenterology

## 2017-05-27 ENCOUNTER — Encounter: Payer: Self-pay | Admitting: Infectious Disease

## 2017-05-27 DIAGNOSIS — N2889 Other specified disorders of kidney and ureter: Secondary | ICD-10-CM

## 2017-05-27 DIAGNOSIS — R7881 Bacteremia: Secondary | ICD-10-CM | POA: Diagnosis not present

## 2017-05-27 DIAGNOSIS — Z452 Encounter for adjustment and management of vascular access device: Secondary | ICD-10-CM | POA: Diagnosis not present

## 2017-05-27 DIAGNOSIS — B9561 Methicillin susceptible Staphylococcus aureus infection as the cause of diseases classified elsewhere: Secondary | ICD-10-CM | POA: Diagnosis not present

## 2017-05-27 NOTE — Telephone Encounter (Signed)
Call --- Pt can take hydrocodone for pain.   Did he see orthopedist yesterday?

## 2017-05-28 ENCOUNTER — Ambulatory Visit (INDEPENDENT_AMBULATORY_CARE_PROVIDER_SITE_OTHER): Payer: 59 | Admitting: Infectious Disease

## 2017-05-28 ENCOUNTER — Encounter: Payer: Self-pay | Admitting: Infectious Disease

## 2017-05-28 VITALS — Temp 98.3°F | Wt 202.0 lb

## 2017-05-28 DIAGNOSIS — N289 Disorder of kidney and ureter, unspecified: Secondary | ICD-10-CM

## 2017-05-28 DIAGNOSIS — B9561 Methicillin susceptible Staphylococcus aureus infection as the cause of diseases classified elsewhere: Secondary | ICD-10-CM

## 2017-05-28 DIAGNOSIS — R7881 Bacteremia: Secondary | ICD-10-CM

## 2017-05-28 DIAGNOSIS — A4101 Sepsis due to Methicillin susceptible Staphylococcus aureus: Secondary | ICD-10-CM

## 2017-05-28 DIAGNOSIS — K5080 Crohn's disease of both small and large intestine without complications: Secondary | ICD-10-CM

## 2017-05-28 HISTORY — DX: Disorder of kidney and ureter, unspecified: N28.9

## 2017-05-28 HISTORY — DX: Sepsis due to methicillin susceptible Staphylococcus aureus: A41.01

## 2017-05-28 NOTE — Progress Notes (Signed)
Subjective:   Chief complaint is left arm pain where he fell and shoulder pain   Patient ID: [name] [name], male    DOB: September 04, 1960, 56 y.o.   MRN: [id number]  HPI  [name] [name] is a 56 y.o. male Crohn's disease well controlled, also with hx of cognitive problems, Complex seizures, kidney stones who recently suffered fractured ribs on right side when sudden jerk from dogs he was walking caused him to strike the ground. He took a dose of percocet for this  and afterwards begain complaining of severe constipation. He also began having polyuria, darker urine and some dysuria and was seen at [facility] for UTI.   He also recently saw his Dermatologist Dr. Elvera Lennox for treatment of his skin cancers and has had chemotherapeutic agents applied to these areas that are irritated and which he keeps covered up for cosmetic reasons.   None have been overtly purulent but they are open wounds.   He then developed abrupt onset of fevers, nausea, with constipation. He had labs done in ED including UA which showed pyuria though he did not have dysuria. Blood cultures were also taken. He had CT abdomen that did not show new pathology beyond lesion in the kidney. He has cirrhosis on imaging prior to this and also has mild splenomegaly along with kidney stones. He was DC on levaquin.  Blood cultures fired + for MSSA.  I brought him back to the hospital and he was admitted via the emergency department blood cultures on the Levaquin actually failed to grow any organisms.  We placed him on cefazolin and ensure that his blood cultures were negative he underwent transesophageal echocardiogram that was negative for vegetations.  He had no evidence for deep infection.  We decided ultimately to treat him with 4 weeks of IV cefazolin which is due to complete on December 10.  Has tolerated antibiotics without problems.  He did sustain a fall recently while walking his dog.  He was seen in the emergency department  it was a long hospital where the emergency room physician felt that he likely had evidence of an [facility] joint separation.  He has been wearing a sling since then.  He did also sustain an abrasion to his leg which appears to be healing up.  Past Medical History:  Diagnosis Date  . Adenomatous colon polyp 10/1999  . Anxiety   . Arthritis    neck, yoga helps.  . Cancer ([place])    skin  . Cataract   . Complex partial seizure ([place])   . Crohn's disease of small and large intestines ([place]) 1999  . Depression   . Diabetes mellitus   . Esophageal stricture   . External hemorrhoids   . GERD (gastroesophageal reflux disease)   . History of kidney stones    3 large stones still present  . History of nuclear stress test 11/15/2010   exercise myoview; normal pattern of perfusion; normal, low risk study   . Hyperlipemia   . Hypertension   . Migraines   . MRSA (methicillin resistant Staphylococcus aureus) 10/2010   . Osteoporosis   . Pneumonia   . PONV (postoperative nausea and vomiting)   . PTSD (post-traumatic stress disorder)   . Renal lesion 05/28/2017  . Seizures ([place])   . Skin cancer    squamous cell multiple; [facility]; followed every 3 months.  . Sleep apnea    CPAP machine, uses nightly  . Staphylococcus aureus bacteremia with sepsis (  [place]) 05/28/2017    Past Surgical History:  Procedure Laterality Date  . CATARACT EXTRACTION Bilateral   . ELBOW SURGERY  2012   elbow MRSA infection   . EYE SURGERY     cataracts removed, /w IOL  . INSERTION OF MESH N/A 07/01/2016   Procedure: INSERTION OF MESH;  Surgeon: [name], MD;  Location: [facility];  Service: General;  Laterality: N/A;  . SHOULDER SURGERY    . SINUS SURGERY WITH INSTATRAK    . TEE WITHOUT CARDIOVERSION N/A 05/09/2017   Procedure: TRANSESOPHAGEAL ECHOCARDIOGRAM (TEE);  Surgeon: [name], MD;  [facility] END[facility]COPY;  Service: Cardiovascular;  Laterality: N/A;  . TRANSTHORACIC ECHOCARDIOGRAM  02/22/2011   EF 55-65%;  increased pattern of LVH with mild conc hypertrophy, abnormal relaxation & increased filling pressure (grade 2 diastolic dysfunction); atrial septum thickened (lipomatous hypertrophy)  . UMBILICAL HERNIA REPAIR N/A 07/01/2016   Procedure: UMBILICAL HERNIA REPAIR WITH MESH;  Surgeon: [name], MD;  Location: [facility];  Service: General;  Laterality: N/A;  . VASECTOMY      Family History  Problem Relation Age of Onset  . Colon polyps Father   . Stroke Father   . Hyperlipidemia Father   . Hypertension Father   . Heart disease Mother        CABG at age 44  . Hyperlipidemia Mother   . Hypertension Mother   . Stroke Paternal Grandmother   . Stroke Paternal Grandfather   . Other Child        tetrology of fallot (cornealia deland syndrome)  . Colon cancer Neg Hx       Social History   Socioeconomic History  . Marital status: Married    Spouse name: [name]  . Number of children: 2  . Years of education: college  . Highest education level: None  Social Needs  . Financial resource strain: None  . Food insecurity - worry: None  . Food insecurity - inability: None  . Transportation needs - medical: None  . Transportation needs - non-medical: None  Occupational History  . Occupation: disabled    Comment: seizure disorder with memory impairment  Tobacco Use  . Smoking status: Never Smoker  . Smokeless tobacco: Never Used  Substance and Sexual Activity  . Alcohol use: No  . Drug use: No  . Sexual activity: Yes    Birth control/protection: Surgical    Comment: 1 partner in last 12 months  Other Topics Concern  . None  Social History Narrative   Marital status: married x 32 years;       Lives:   lives at home with his family.      Children:  two children; no grandchildren.  Son is special needs at age 4yo; Cornelian Delaine with TOF.       Employment: unemployed; [employer] long term disability      Tobacco: none      Alcohol: none      Drugs: none      Exercise:  unable to exercise due to frequent falls   Patient has a college education.   Patient is right-handed.   Patient drinks one cup of soda daily.    Allergies  Allergen Reactions  . Penicillins Hives    Rash as child- tolerates Ancef Has patient had a PCN reaction causing immediate rash, facial/tongue/throat swelling, SOB or lightheadedness with hypotension: YES Has patient had a PCN reaction causing severe rash involving mucus membranes or skin necrosis: Unknown Has  patient had a PCN reaction that required hospitalization: Unknown Has patient had a PCN reaction occurring within the last 10 years:Unknown If all of the above answers are "NO", then may proceed with Cephalosporin use.    . Sulfonamide Derivatives Hives  . Azithromycin Swelling    SWELLING REACTION UNSPECIFIED   . Beef-Derived Products Diarrhea    RED MEAT > UNSPECIFIED REACTION   . Milk-Related Compounds Diarrhea    DAIRY >UNSPECIFIED REACTION   . Dilaudid [Hydromorphone Hcl] Nausea And Vomiting     Current Outpatient Medications:  .  Ascorbic Acid (VITAMIN C PO), Take 1 tablet at bedtime by mouth., Disp: , Rfl:  .  aspirin EC 81 MG tablet, Take 81 mg at bedtime by mouth. , Disp: , Rfl:  .  azelastine (ASTELIN) 0.1 % nasal spray, Place 2 sprays into both nostrils 2 (two) times daily. Use in each nostril as directed (Patient taking differently: Place 2 sprays daily into both nostrils. ), Disp: 90 mL, Rfl: 3 .  Calcium Carb-Cholecalciferol (CALCIUM-VITAMIN D) 500-200 MG-UNIT tablet, Take 1 tablet 2 (two) times daily by mouth., Disp: , Rfl:  .  ceFAZolin (ANCEF) IVPB, Inject 2 g every 8 (eight) hours for 23 days into the vein. Indication:  SA bacteremia Last Day of Therapy:  06/02/17 Labs - Once weekly:  CBC/D and BMP, Labs - Every other week:  ESR and CRP, Disp: 68 Units, Rfl: 0 .  cholecalciferol (VITAMIN D) 1000 units tablet, Take 1,000 Units at bedtime by mouth. , Disp: , Rfl:  .  diltiazem (TIAZAC) 180 MG 24 hr  capsule, Take 1 capsule (180 mg total) by mouth daily., Disp: 90 capsule, Rfl: 3 .  eletriptan (RELPAX) 40 MG tablet, Take 1 tablet at onset of headache, may repeat in 2 hours if headache persists or reoccurs., Disp: 12 tablet, Rfl: 4 .  esomeprazole (NEXIUM) 40 MG capsule, TAKE 1 CAPSULE TWICE A DAY, Disp: 180 capsule, Rfl: 1 .  Eszopiclone 3 MG TABS, Take 1 tablet (3 mg total) by mouth at bedtime. Take immediately before bedtime, Disp: 30 tablet, Rfl: 1 .  ferrous sulfate 325 (65 FE) MG tablet, Take 325 mg at bedtime by mouth. , Disp: , Rfl:  .  fluorouracil (EFUDEX) 5 % cream, Apply 1 application at bedtime topically. For Basal Cell Carinoma of arms , Disp: , Rfl:  .  folic acid (FOLVITE) 1 MG tablet, Take 2 tablets (2 mg total) by mouth daily., Disp: 180 tablet, Rfl: 3 .  glucose blood (CHOICE DM FORA G20 TEST STRIPS) test strip, Use as instructed, Disp: 100 each, Rfl: 12 .  HYDROcodone-acetaminophen (NORCO/VICODIN) 5-325 MG tablet, Take 1-2 tablets by mouth every 4 (four) hours as needed for moderate pain or severe pain., Disp: 20 tablet, Rfl: 0 .  Ibuprofen-Diphenhydramine Cit (ADVIL PM PO), Take 1 tablet at bedtime by mouth. , Disp: , Rfl:  .  Lactase (DAIRY-RELIEF PO), Take 2 tablets by mouth 2 (two) times daily. , Disp: , Rfl:  .  lamoTRIgine (LAMICTAL) 100 MG tablet, Take 1 tablet (100 mg total) by mouth 2 (two) times daily., Disp: 180 tablet, Rfl: 3 .  loratadine (CLARITIN) 10 MG tablet, Take 10 mg at bedtime by mouth. , Disp: , Rfl:  .  mercaptopurine (PURINETHOL) 50 MG tablet, TAKE 2 TABLETS DAILY ON AN EMPTY STOMACH 1 HOUR BEFOREOR 2 HOURS AFTER MEALS     (CAUTION: CHEMOTHERAPY), Disp: 180 tablet, Rfl: 1 .  mesalamine (LIALDA) 1.2 g EC tablet, Take 2  tablets (2.4 g total) by mouth daily. (Patient taking differently: Take 1.2 g 2 (two) times daily by mouth. ), Disp: 180 tablet, Rfl: 1 .  methscopolamine (PAMINE FORTE) 5 MG tablet, Take 1 tablet (5 mg total) by mouth 2 (two) times daily.,  Disp: 180 tablet, Rfl: 1 .  naproxen (NAPROSYN) 500 MG tablet, Take 1 tablet (500 mg total) by mouth 2 (two) times daily., Disp: 30 tablet, Rfl: 0 .  ONE TOUCH ULTRA TEST test strip, USE AS INSTRUCTED, Disp: 100 each, Rfl: 2 .  polyethylene glycol (MIRALAX / GLYCOLAX) packet, Take 17 g daily by mouth., Disp: 14 each, Rfl: 0 .  potassium chloride SA (K-DUR,KLOR-CON) 20 MEQ tablet, Take 1 tablet (20 mEq total) by mouth 2 (two) times daily., Disp: 180 tablet, Rfl: 3 .  Probiotic Product (PROBIOTIC PO), Take 1 tablet at bedtime by mouth. , Disp: , Rfl:  .  sertraline (ZOLOFT) 100 MG tablet, Take 200 mg daily with breakfast by mouth. , Disp: , Rfl:  .  traZODone (DESYREL) 100 MG tablet, Take 50-100 mg at bedtime by mouth. , Disp: , Rfl:  No current facility-administered medications for this visit.   Facility-Administered Medications Ordered in Other Visits:  .  gadopentetate dimeglumine (MAGNEVIST) injection 20 mL, 20 mL, Intravenous, Once PRN, Marcial Pacas, MD     Review of Systems  Constitutional: Negative for activity change, appetite change, chills, diaphoresis, fatigue, fever and unexpected weight change.  HENT: Negative for congestion, rhinorrhea, sinus pressure, sneezing, sore throat and trouble swallowing.   Eyes: Negative for photophobia and visual disturbance.  Respiratory: Negative for cough, chest tightness, shortness of breath, wheezing and stridor.   Cardiovascular: Negative for chest pain, palpitations and leg swelling.  Gastrointestinal: Negative for abdominal distention, abdominal pain, anal bleeding, blood in stool, constipation, diarrhea, nausea and vomiting.  Genitourinary: Negative for difficulty urinating, dysuria, flank pain and hematuria.  Musculoskeletal: Positive for arthralgias. Negative for back pain, gait problem, joint swelling and myalgias.  Skin: Positive for wound. Negative for color change, pallor and rash.  Neurological: Negative for dizziness, tremors, weakness  and light-headedness.  Hematological: Negative for adenopathy. Does not bruise/bleed easily.  Psychiatric/Behavioral: Negative for agitation, behavioral problems, confusion, decreased concentration, dysphoric mood and sleep disturbance.       Objective:   Physical Exam  Constitutional: He is oriented to person, place, and time. He appears well-developed and well-nourished.  HENT:  Head: Normocephalic and atraumatic.  Eyes: Conjunctivae and EOM are normal.  Neck: Normal range of motion. Neck supple.  Cardiovascular: Normal rate, regular rhythm and normal heart sounds. Exam reveals no gallop and no friction rub.  No murmur heard. Pulmonary/Chest: Effort normal and breath sounds normal. No respiratory distress. He has no wheezes. He has no rales. He exhibits no tenderness.  Abdominal: Soft. He exhibits no distension.  Musculoskeletal: Normal range of motion. He exhibits no edema or tenderness.  Neurological: He is alert and oriented to person, place, and time.  Skin: Skin is warm and dry. No rash noted. No pallor.  Psychiatric: He has a normal mood and affect. His behavior is normal. Judgment and thought content normal.  Nursing note and vitals reviewed.   Line is clean dry and intact.  Right forearm where he had 1 of the squamous cell lesions removed is healing up  Left knee with abrasion see picture 05/28/2017:         Assessment & Plan:    #1 methicillin sensitive Staphylococcus aureus bacteremia with sepsis: Source was  likely skin will have him finish his 4-week course of cefazolin and then we will check surveillance cultures 2 + weeks after his last dose of antibiotics  2  New abrasion to left leg: Continue local wound care  3.  New AC joint problem follow-up with primary care and orthopedics  #4 Renal lesion: He had a lesion in his right kidney discovered on imaging.  Attempts were made to visualize this better with an MRI but he was moving too much to facilitate proper  evaluation he is to get another dedicated CT scan of the kidney.  But he is concerned that this may be cancer and indeed this is what the radiologist are also worried about.  I spent greater than 25 minutes with the patient including greater than 50% of time in face to face counsel of the patient and in coordination of his  Care with review of how his Staphylococcus aureus has been managed plans to get repeat blood cultures review of his recent finding on CT scan with the patient and with me also viewing the images personally and precluding area that shows abnormal lesion, reviewing necessary steps going forward

## 2017-05-28 NOTE — Patient Instructions (Signed)
Make appt with lab to get blood cultures done in early January

## 2017-05-29 ENCOUNTER — Ambulatory Visit: Payer: Self-pay | Admitting: *Deleted

## 2017-05-29 ENCOUNTER — Other Ambulatory Visit: Payer: Self-pay | Admitting: Pharmacist

## 2017-05-29 ENCOUNTER — Encounter: Payer: Self-pay | Admitting: Family Medicine

## 2017-05-29 DIAGNOSIS — M25512 Pain in left shoulder: Secondary | ICD-10-CM | POA: Diagnosis not present

## 2017-05-29 DIAGNOSIS — Z452 Encounter for adjustment and management of vascular access device: Secondary | ICD-10-CM | POA: Diagnosis not present

## 2017-05-29 DIAGNOSIS — B9561 Methicillin susceptible Staphylococcus aureus infection as the cause of diseases classified elsewhere: Secondary | ICD-10-CM | POA: Diagnosis not present

## 2017-05-29 DIAGNOSIS — N390 Urinary tract infection, site not specified: Secondary | ICD-10-CM | POA: Diagnosis not present

## 2017-05-29 NOTE — Telephone Encounter (Signed)
[name] [name] physical therapist with [facility] called.  The pt fell down some steps this morning.   No obvious injuries.  He has a history of multiple falls.   He has orthostatic hypotension: BP this morning sitting 180/96 BP this morning standing 160/89 with dizziness.  (This has been a chronic problem for 3 months). When ambulates >100 feet his O2 sats drop from 96% to 84% with increased dizziness reported.  He has very limited supervision during the day.  He has limited function  on the left side due to a shoulder injury on the left side from a previous fall.   MRI pending for the shoulder. He has a PICC in right arm for a staph infection he is being treated for.  The contact number for [name] [name], PT is [phone].  I routed a high priority  note to Dr. Thompson Caul nurse pool regarding these matters.

## 2017-05-30 ENCOUNTER — Telehealth: Payer: Self-pay | Admitting: Family Medicine

## 2017-05-30 DIAGNOSIS — B9561 Methicillin susceptible Staphylococcus aureus infection as the cause of diseases classified elsewhere: Secondary | ICD-10-CM | POA: Diagnosis not present

## 2017-05-30 DIAGNOSIS — N390 Urinary tract infection, site not specified: Secondary | ICD-10-CM | POA: Diagnosis not present

## 2017-05-30 DIAGNOSIS — Z452 Encounter for adjustment and management of vascular access device: Secondary | ICD-10-CM | POA: Diagnosis not present

## 2017-05-30 NOTE — Telephone Encounter (Signed)
Copied from [place] [id number]. Topic: Quick Communication - See Telephone Encounter >> May 30, 2017  3:02 PM Hewitt Shorts wrote: CRM for notification. See Telephone encounter for:  Home health is calling to get an order of speech order 1 time 3 weeks for cognition [facility] [phone] 05/30/17.

## 2017-05-30 NOTE — Telephone Encounter (Signed)
[facility] call to [facility] to give verbal orders. Spoke with [employer]. She states she will need written orders for ST and OT.   Provider, please advise.

## 2017-05-30 NOTE — Telephone Encounter (Signed)
Reason for CRM: Henderson Newcomer OT with [facility] called to get verbal orders to see the patient 1x a week for 4 weeks. Susan's # [phone], can leave a vm.

## 2017-05-30 NOTE — Telephone Encounter (Deleted)
Copied from [place] [id number]. Topic: General - Other >> May 29, 2017  3:19 PM Patrice Paradise wrote: Reason for CRM: Henderson Newcomer OT with [facility] called to get verbal orders to see the patient 1x a week for 4 weeks. Susan's # [phone], can leave a vm.

## 2017-05-30 NOTE — Telephone Encounter (Signed)
Left message with Herbert Deaner, PT.  Have asked Clair Gulling to return call.

## 2017-05-30 NOTE — Telephone Encounter (Signed)
Spoke with PT --- chronic dizziness; confirmed orthostatic hypotension.  Dizziness worsens with prolonged ambulation. Cognition is reduced.  Needs supervision and not available in house.  Recommended short term placement.  Has medical SW who is involved; to assist.  Speech therapy focusing on cognition.  Recommends to avoid steps.  No DOE; no SOB.  Twice weekly physical therapy.  Lung sounds normal at visit.  Please call patient --- I am concerned about low oxygen level with ambulation.  I recommend reevaluation/appointment in upcoming 1-2 days.  Please call to schedule.

## 2017-05-30 NOTE — Telephone Encounter (Deleted)
Copied from [place] [id number]. Topic: General - Other >> May 29, 2017  3:19 PM Patrice Paradise wrote: Reason for CRM: Henderson Newcomer OT with [facility] called to get verbal orders to see the patient 1x a week for 4 weeks. Susan's # [phone], can leave a vm.

## 2017-05-31 ENCOUNTER — Encounter: Payer: Self-pay | Admitting: Infectious Disease

## 2017-05-31 DIAGNOSIS — R7881 Bacteremia: Secondary | ICD-10-CM | POA: Diagnosis not present

## 2017-05-31 DIAGNOSIS — N390 Urinary tract infection, site not specified: Secondary | ICD-10-CM | POA: Diagnosis not present

## 2017-05-31 DIAGNOSIS — Z452 Encounter for adjustment and management of vascular access device: Secondary | ICD-10-CM | POA: Diagnosis not present

## 2017-06-02 ENCOUNTER — Ambulatory Visit: Payer: 59 | Admitting: Family Medicine

## 2017-06-04 ENCOUNTER — Ambulatory Visit (INDEPENDENT_AMBULATORY_CARE_PROVIDER_SITE_OTHER): Payer: 59 | Admitting: Family Medicine

## 2017-06-04 ENCOUNTER — Encounter: Payer: Self-pay | Admitting: Family Medicine

## 2017-06-04 ENCOUNTER — Ambulatory Visit: Payer: 59 | Admitting: Family Medicine

## 2017-06-04 ENCOUNTER — Other Ambulatory Visit: Payer: Self-pay

## 2017-06-04 ENCOUNTER — Ambulatory Visit (INDEPENDENT_AMBULATORY_CARE_PROVIDER_SITE_OTHER): Payer: 59

## 2017-06-04 VITALS — BP 134/78 | HR 84 | Temp 98.0°F | Resp 16 | Ht 71.65 in | Wt 200.0 lb

## 2017-06-04 DIAGNOSIS — S2241XD Multiple fractures of ribs, right side, subsequent encounter for fracture with routine healing: Secondary | ICD-10-CM | POA: Diagnosis not present

## 2017-06-04 DIAGNOSIS — D539 Nutritional anemia, unspecified: Secondary | ICD-10-CM

## 2017-06-04 DIAGNOSIS — S42145D Nondisplaced fracture of glenoid cavity of scapula, left shoulder, subsequent encounter for fracture with routine healing: Secondary | ICD-10-CM | POA: Diagnosis not present

## 2017-06-04 DIAGNOSIS — R0902 Hypoxemia: Secondary | ICD-10-CM

## 2017-06-04 DIAGNOSIS — N289 Disorder of kidney and ureter, unspecified: Secondary | ICD-10-CM | POA: Diagnosis not present

## 2017-06-04 DIAGNOSIS — A4101 Sepsis due to Methicillin susceptible Staphylococcus aureus: Secondary | ICD-10-CM

## 2017-06-04 DIAGNOSIS — R0609 Other forms of dyspnea: Secondary | ICD-10-CM

## 2017-06-04 DIAGNOSIS — I10 Essential (primary) hypertension: Secondary | ICD-10-CM

## 2017-06-04 NOTE — Telephone Encounter (Signed)
Done

## 2017-06-04 NOTE — Patient Instructions (Addendum)
Recommend: trazodone 172m ata bedtime and Norco at bedtime; do not redose Norco during night; do not take Lunesta. Please have an incentive spirometry delivered from [facility]      IF you received an x-ray today, you will receive an [facility]. Please contact [facility] at [phone] questions or concerns regarding your invoice.   IF you received labwork today, you will receive an invoice from [facility] Please contact [facility] at [phone] questions or concerns regarding your invoice.   Our billing staff will not be able to assist you with questions regarding bills from these companies.  You will be contacted with the lab results as soon as they are available. The fastest way to get your results is to activate your My Chart account. Instructions are located on the last page of this paperwork. If you have not heard from [place] the results in 2 weeks, please contact this office.

## 2017-06-04 NOTE — Progress Notes (Signed)
Subjective:    Patient ID: [name] [name], male    DOB: 1961/05/06, 56 y.o.   MRN: [id number]  06/04/2017  Hypertension (follow-up ) and Oxygen Levels    HPI This 56 y.o. male presents with wife for evaluation of L SHOULDER SPRAIN, ELEVATED BLOOD PRESSURE, HYPOXIA.   Received a call from physical therapist on 05/29/17 reporting that patient suffered another fall down steops.  BP 180/96 and after ambulating > 100 feet, sats dropped from 96 to 84%.  Patient contacted to schedule an appointment; advised to present to ED for SOB.  L scapular fracture:  Saw orthopedist 05/26/17; scheduled MRI 05/29/17 with acute communated scapula and fracture inferior margin of glenoid articular surface with edema and soft tissue swelling.  Mild tendinosis of; super and posterior labral tear.  Can heal on own.  No fluid collection or hematoma.   Follow-up with ortho next week for follow-up.  Will repeta xray from MRI.  Taking Hydrocodone for pain; not taking all the time.  At night, taking Lunesta and Trazodone 84m.  Dr. ALoni Musewould really like to take Lunesta with 1065mTrazodone.  Dr. A Loni Museecommended Trazodone 5013mith hydrocodone and no Lunesta. Can fall asleep most of the time.  Sleeping in the bed for the past week.  Was wearing sling at night; not using sling lately.  When takes Norco, really out of it.  Wife really concerned with leaving pt there.    Scheduled to see OT today with [facility].  Will communicate with orthopedist.  Physical therapist returns on Friday.   Hypoxia: has been SOB; several times per day; if moves around a lot, the three broken ribs on the RIGHT hurts.  The shoulder and ribs cause pain.  Onset after last visit.   No fever/chills/sweats.  No cough.  Cold intolerance.  No Incentive Spirometer.  [place] on 05/26/2017; poor judgement day; wife went to work.  Might have had a black out; does not recall that fall.  Neighbor took home.  No ED evaluation.  Planned to walk dog.  Fell on buttocks.  Sitting  down on hard chair causes discomfort.  Just nine days ago.  Right after that fall, carried laundry basket down stairs.  Almost fell; did fall down last couple of steps.    Renal CT scheduled for Tuesday.   BP Readings from Last 3 Encounters:  06/04/17 134/78  05/23/17 128/70  05/14/17 (!) 134/59   Wt Readings from Last 3 Encounters:  06/04/17 200 lb (90.7 kg)  05/28/17 202 lb (91.6 kg)  05/23/17 200 lb (90.7 kg)   Immunization History  Administered Date(s) Administered  . Influenza Split 03/24/2013, 03/24/2014, 03/26/2016  . Influenza Whole 03/24/2012  . Influenza,inj,Quad PF,6+ Mos 02/16/2015  . Influenza-Unspecified 04/15/2017  . Pneumococcal Conjugate-13 07/07/2014  . Pneumococcal Polysaccharide-23 06/08/2007  . Tdap 05/15/2016  . Zoster 08/18/2014    Review of Systems  Constitutional: Negative for activity change, appetite change, chills, diaphoresis, fatigue and fever.  Respiratory: Negative for cough, wheezing and stridor.   Cardiovascular: Negative for chest pain, palpitations and leg swelling.  Gastrointestinal: Positive for diarrhea. Negative for abdominal pain, nausea and vomiting.  Endocrine: Negative for cold intolerance, heat intolerance, polydipsia, polyphagia and polyuria.  Musculoskeletal: Positive for arthralgias, back pain, gait problem and myalgias.  Skin: Negative for color change, rash and wound.  Neurological: Positive for dizziness. Negative for tremors, seizures, syncope, facial asymmetry, speech difficulty, weakness, light-headedness, numbness and headaches.  Psychiatric/Behavioral: Positive for confusion, decreased concentration and dysphoric  mood. Negative for agitation, behavioral problems, hallucinations and sleep disturbance. The patient is not nervous/anxious and is not hyperactive.     Past Medical History:  Diagnosis Date  . Adenomatous colon polyp 10/1999  . Anxiety   . Arthritis    neck, yoga helps.  . Cancer ([place])    skin  . Cataract     . Complex partial seizure ([place])   . Crohn's disease of small and large intestines ([place]) 1999  . Depression   . Diabetes mellitus   . Esophageal stricture   . External hemorrhoids   . GERD (gastroesophageal reflux disease)   . History of kidney stones    3 large stones still present  . History of nuclear stress test 11/15/2010   exercise myoview; normal pattern of perfusion; normal, low risk study   . Hyperlipemia   . Hypertension   . Migraines   . MRSA (methicillin resistant Staphylococcus aureus) 10/2010   . Osteoporosis   . Pneumonia   . PONV (postoperative nausea and vomiting)   . PTSD (post-traumatic stress disorder)   . Renal lesion 05/28/2017  . Seizures ([place])   . Skin cancer    squamous cell multiple; [place]; followed every 3 months.  . Sleep apnea    CPAP machine, uses nightly  . Staphylococcus aureus bacteremia with sepsis ([place]) 05/28/2017   Past Surgical History:  Procedure Laterality Date  . CATARACT EXTRACTION Bilateral   . ELBOW SURGERY  2012   elbow MRSA infection   . EYE SURGERY     cataracts removed, /w IOL  . INSERTION OF MESH N/A 07/01/2016   Procedure: INSERTION OF MESH;  Surgeon: [name], MD;  Location: [facility];  Service: General;  Laterality: N/A;  . SHOULDER SURGERY    . SINUS SURGERY WITH INSTATRAK    . TEE WITHOUT CARDIOVERSION N/A 05/09/2017   Procedure: TRANSESOPHAGEAL ECHOCARDIOGRAM (TEE);  Surgeon: [name], MD;  [facility] ENDOSCOPY;  Service: Cardiovascular;  Laterality: N/A;  . TRANSTHORACIC ECHOCARDIOGRAM  02/22/2011   EF 55-65%; increased pattern of LVH with mild conc hypertrophy, abnormal relaxation & increased filling pressure (grade 2 diastolic dysfunction); atrial septum thickened (lipomatous hypertrophy)  . UMBILICAL HERNIA REPAIR N/A 07/01/2016   Procedure: UMBILICAL HERNIA REPAIR WITH MESH;  Surgeon: [name], MD;  Location: [facility];  Service: General;  Laterality: N/A;  . VASECTOMY     Allergies  Allergen  Reactions  . Penicillins Hives    Rash as child- tolerates Ancef Has patient had a PCN reaction causing immediate rash, facial/tongue/throat swelling, SOB or lightheadedness with hypotension: YES Has patient had a PCN reaction causing severe rash involving mucus membranes or skin necrosis: Unknown Has patient had a PCN reaction that required hospitalization: Unknown Has patient had a PCN reaction occurring within the last 10 years:Unknown If all of the above answers are "NO", then may proceed with Cephalosporin use.    . Sulfonamide Derivatives Hives  . Azithromycin Swelling    SWELLING REACTION UNSPECIFIED   . Beef-Derived Products Diarrhea    RED MEAT > UNSPECIFIED REACTION   . Milk-Related Compounds Diarrhea    DAIRY >UNSPECIFIED REACTION   . Dilaudid [Hydromorphone Hcl] Nausea And Vomiting   Current Outpatient Medications on File Prior to Visit  Medication Sig Dispense Refill  . ALPRAZolam (XANAX) 0.5 MG tablet TK 1-2 TS ONCE ONE HOUR PRIOR TO MRI  0  . Ascorbic Acid (VITAMIN C PO) Take 1 tablet at bedtime by mouth.    Marland Kitchen aspirin  EC 81 MG tablet Take 81 mg at bedtime by mouth.     Marland Kitchen azelastine (ASTELIN) 0.1 % nasal spray Place 2 sprays into both nostrils 2 (two) times daily. Use in each nostril as directed (Patient taking differently: Place 2 sprays daily into both nostrils. ) 90 mL 3  . Calcium Carb-Cholecalciferol (CALCIUM-VITAMIN D) 500-200 MG-UNIT tablet Take 1 tablet 2 (two) times daily by mouth.    . cholecalciferol (VITAMIN D) 1000 units tablet Take 1,000 Units at bedtime by mouth.     . diazepam (VALIUM) 10 MG tablet TK 1 T PO 1 HOUR PRIOR TO PROCEDURE. REPEAT PRN  0  . diltiazem (TIAZAC) 180 MG 24 hr capsule Take 1 capsule (180 mg total) by mouth daily. 90 capsule 3  . eletriptan (RELPAX) 40 MG tablet Take 1 tablet at onset of headache, may repeat in 2 hours if headache persists or reoccurs. 12 tablet 4  . esomeprazole (NEXIUM) 40 MG capsule TAKE 1 CAPSULE TWICE A DAY 180  capsule 1  . Eszopiclone 3 MG TABS Take 1 tablet (3 mg total) by mouth at bedtime. Take immediately before bedtime 30 tablet 1  . ferrous sulfate 325 (65 FE) MG tablet Take 325 mg at bedtime by mouth.     . fluorouracil (EFUDEX) 5 % cream Apply 1 application at bedtime topically. For Basal Cell Carinoma of arms     . folic acid (FOLVITE) 1 MG tablet Take 2 tablets (2 mg total) by mouth daily. 180 tablet 3  . glucose blood (CHOICE DM FORA G20 TEST STRIPS) test strip Use as instructed 100 each 12  . HYDROcodone-acetaminophen (NORCO/VICODIN) 5-325 MG tablet Take 1-2 tablets by mouth every 4 (four) hours as needed for moderate pain or severe pain. 20 tablet 0  . Ibuprofen-Diphenhydramine Cit (ADVIL PM PO) Take 1 tablet at bedtime by mouth.     . Lactase (DAIRY-RELIEF PO) Take 2 tablets by mouth 2 (two) times daily.     Marland Kitchen lamoTRIgine (LAMICTAL) 100 MG tablet Take 1 tablet (100 mg total) by mouth 2 (two) times daily. 180 tablet 3  . loratadine (CLARITIN) 10 MG tablet Take 10 mg at bedtime by mouth.     . mercaptopurine (PURINETHOL) 50 MG tablet TAKE 2 TABLETS DAILY ON AN EMPTY STOMACH 1 HOUR BEFOREOR 2 HOURS AFTER MEALS     (CAUTION: CHEMOTHERAPY) 180 tablet 1  . mercaptopurine (PURINETHOL) 50 MG tablet FOR DIRECTIONS ON HOW TO   TAKE THIS MEDICINE, READ   THE ENCLOSED MEDICATION    INFORMATION FORM 180 tablet 0  . mesalamine (LIALDA) 1.2 g EC tablet Take 2 tablets (2.4 g total) by mouth daily. (Patient taking differently: Take 1.2 g 2 (two) times daily by mouth. ) 180 tablet 1  . mesalamine (LIALDA) 1.2 g EC tablet TAKE 2 TABLETS (=2.4G)     DAILY 180 tablet 0  . metFORMIN (GLUCOPHAGE) 500 MG tablet     . methocarbamol (ROBAXIN) 750 MG tablet TK 1 T PO Q 6 TO 8 H PRF PAIN OR SPASM  0  . methscopolamine (PAMINE FORTE) 5 MG tablet Take 1 tablet (5 mg total) by mouth 2 (two) times daily. 180 tablet 1  . naproxen (NAPROSYN) 500 MG tablet Take 1 tablet (500 mg total) by mouth 2 (two) times daily. 30 tablet 0   . ONE TOUCH ULTRA TEST test strip USE AS INSTRUCTED 100 each 2  . polyethylene glycol (MIRALAX / GLYCOLAX) packet Take 17 g daily by mouth. 14 each  0  . potassium chloride SA (K-DUR,KLOR-CON) 20 MEQ tablet Take 1 tablet (20 mEq total) by mouth 2 (two) times daily. 180 tablet 3  . Probiotic Product (PROBIOTIC PO) Take 1 tablet at bedtime by mouth.     . sertraline (ZOLOFT) 100 MG tablet Take 200 mg daily with breakfast by mouth.     . traZODone (DESYREL) 100 MG tablet Take 50-100 mg at bedtime by mouth.      Current Facility-Administered Medications on File Prior to Visit  Medication Dose [name] Frequency Provider Last Rate Last Dose  . gadopentetate dimeglumine (MAGNEVIST) injection 20 mL  20 mL Intravenous Once PRN Marcial Pacas, MD       Social History   Socioeconomic History  . Marital status: Married    Spouse name: [name]  . Number of children: 2  . Years of education: college  . Highest education level: Not on file  Social Needs  . Financial resource strain: Not on file  . Food insecurity - worry: Not on file  . Food insecurity - inability: Not on file  . Transportation needs - medical: Not on file  . Transportation needs - non-medical: Not on file  Occupational History  . Occupation: disabled    Comment: seizure disorder with memory impairment  Tobacco Use  . Smoking status: Never Smoker  . Smokeless tobacco: Never Used  Substance and Sexual Activity  . Alcohol use: No  . Drug use: No  . Sexual activity: Yes    Birth control/protection: Surgical    Comment: 1 partner in last 12 months  Other Topics Concern  . Not on file  Social History Narrative   Marital status: married x 32 years;       Lives:   lives at home with his family.      Children:  two children; no grandchildren.  Son is special needs at age 88yo; Cornelian Delaine with TOF.       Employment: unemployed; [employer] long term disability      Tobacco: none      Alcohol: none      Drugs: none       Exercise: unable to exercise due to frequent falls   Patient has a college education.   Patient is right-handed.   Patient drinks one cup of soda daily.   Family History  Problem Relation Age of Onset  . Colon polyps Father   . Stroke Father   . Hyperlipidemia Father   . Hypertension Father   . Heart disease Mother        CABG at age 92  . Hyperlipidemia Mother   . Hypertension Mother   . Stroke Paternal Grandmother   . Stroke Paternal Grandfather   . Other Child        tetrology of fallot (cornealia deland syndrome)  . Colon cancer Neg Hx        Objective:    BP 134/78   Pulse 84   Temp 98 F (36.7 C) (Oral)   Resp 16   Ht 5' 11.65" (1.82 m)   Wt 200 lb (90.7 kg)   SpO2 94%   BMI 27.39 kg/m  Physical Exam  Constitutional: He is oriented to person, place, and time. He appears well-developed and well-nourished. No distress.  HENT:  Head: Normocephalic and atraumatic.  Right Ear: External ear normal.  Left Ear: External ear normal.  Nose: Nose normal.  Mouth/Throat: Oropharynx is clear and moist.  Eyes: Conjunctivae and EOM are normal.  Pupils are equal, round, and reactive to light.  Neck: Normal range of motion. Neck supple. Carotid bruit is not present. No thyromegaly present.  Cardiovascular: Normal rate, regular rhythm, normal heart sounds and intact distal pulses. Exam reveals no gallop and no friction rub.  No murmur heard. Hommen's negative.  Pulmonary/Chest: Effort normal and breath sounds normal. He has no wheezes. He has no rales.  Abdominal: Soft. Bowel sounds are normal. He exhibits no distension and no mass. There is no tenderness. There is no rebound and no guarding.  Musculoskeletal:       Left shoulder: He exhibits decreased range of motion and pain.       Right lower leg: Normal.       Left lower leg: Normal.       Legs: Healing ecchymoses L medial lower leg.  Lymphadenopathy:    He has no cervical adenopathy.  Neurological: He is alert and  oriented to person, place, and time. No cranial nerve deficit.  Skin: Skin is warm and dry. No rash noted. He is not diaphoretic.  Psychiatric: He has a normal mood and affect. His behavior is normal.  Nursing note and vitals reviewed.  No results found. Depression screen [facility] 2/9 06/04/2017 05/28/2017 05/23/2017 02/11/2017 11/19/2016  Decreased Interest 0 0 3 1 0  Down, Depressed, Hopeless 0 0 3 1 0  PHQ - 2 Score 0 0 6 2 0  Altered sleeping - - - 0 -  Tired, decreased energy - 0 1 1 -  Change in appetite - 0 1 1 -  Feeling bad or failure about yourself  - 0 0 1 -  Trouble concentrating - 0 0 1 -  Moving slowly or fidgety/restless - 0 0 0 -  Suicidal thoughts - 0 0 0 -  PHQ-9 Score - - - 6 -  Difficult doing work/chores - - - - -  Some recent data might be hidden   Fall Risk  06/04/2017 05/28/2017 05/23/2017 02/11/2017 11/19/2016  Falls in the past year? Yes Yes Yes Yes Yes  Number falls in past yr: 2 or more 2 or more 2 or more 2 or more 2 or more  Injury with Fall? - Yes Yes No Yes  Comment - - - - -  Risk Factor Category  - High Fall Risk High Fall Risk High Fall Risk -  Risk for fall due to : - Impaired balance/gait;Medication side effect - - -       Assessment & Plan:   1. Hypoxia   2. Anemia, macrocytic   3. DOE (dyspnea on exertion)   4. Renal lesion   5. Staphylococcus aureus bacteremia with sepsis ([place])   6. Essential hypertension   7. Closed fracture of multiple ribs of right side with routine healing, subsequent encounter   8. Closed nondisplaced fracture of glenoid cavity of left scapula with routine healing, subsequent encounter    New onset exertional hypoxia in the setting of multiple rib fractures and acute scapular fracture.  No respiratory distress on exam.  Normal pulse oximetry in office at rest.  Obtain chest x-ray to rule out secondary etiologies.  Order incentive spirometry very via [facility].  Exertional hypoxia most consistent with atelectasis due  to decreased inspiration due to pain from rib fractures and scapular fractures.  If exertional hypoxia does not improve with incentive spirometry and improved pain control, obtain repeat CT angios to rule out pulmonary embolism.  Low suspicion at this time for pulmonary  embolism due to benign exam in office today.  Recent elevation in blood pressures due to pain from rib fractures and scapular fracture.  Stable blood pressure in office.  Scheduled for CT renal protocol this week for renal lesion.  Suffering with recurrent falls and [facility] is recommending placement for rehabilitation to ensure safety of patient during the day while wife is working.  Social worker has been assigned to patient.  Family would like to defer placement until after CT of the kidney next week.  This seems like the most reasonable approach for patient and family.  Patient suffered multiple rib fractures with fall in November.  Continues to suffer with pain.  Encourage regular deep inspirations throughout the day.  Patient has completed the antibiotics for Staphylococcus bacteremia.  Needs PICC line replaced.  Will contact infectious disease provider for removal of PICC line.  Prolonged face-to-face for 40 minutes with greater than 50% of time dedicated to counseling and coordination of care.  Orders Placed This Encounter  Procedures  . DG Chest 2 View    Standing Status:   Future    Number of Occurrences:   1    Standing Expiration Date:   06/04/2018    Order Specific Question:   Reason for Exam (SYMPTOM  OR DIAGNOSIS REQUIRED)    Answer:   hypoxia wiht ambulation; RIGHT rib fractures in past month    Order Specific Question:   Preferred imaging location?    Answer:   External  . CBC with Differential/Platelet  . Comprehensive metabolic panel  . Vitamin B12  . Folate RBC   No orders of the defined types were placed in this encounter.   No Follow-up on file.   Kristi Elayne Guerin, M.D. Primary  Care at [facility] previously [facility] [address] [place], [place]  [address] [phone] phone [phone] fax

## 2017-06-05 ENCOUNTER — Telehealth: Payer: Self-pay | Admitting: Family Medicine

## 2017-06-05 ENCOUNTER — Other Ambulatory Visit: Payer: Self-pay | Admitting: Pharmacist

## 2017-06-05 DIAGNOSIS — B9561 Methicillin susceptible Staphylococcus aureus infection as the cause of diseases classified elsewhere: Secondary | ICD-10-CM | POA: Diagnosis not present

## 2017-06-05 DIAGNOSIS — N390 Urinary tract infection, site not specified: Secondary | ICD-10-CM | POA: Diagnosis not present

## 2017-06-05 DIAGNOSIS — Z125 Encounter for screening for malignant neoplasm of prostate: Secondary | ICD-10-CM | POA: Diagnosis not present

## 2017-06-05 DIAGNOSIS — Z452 Encounter for adjustment and management of vascular access device: Secondary | ICD-10-CM | POA: Diagnosis not present

## 2017-06-05 LAB — CBC WITH DIFFERENTIAL/PLATELET
BASOS: 1 %
Basophils Absolute: 0.1 10*3/uL (ref 0.0–0.2)
EOS (ABSOLUTE): 0.2 10*3/uL (ref 0.0–0.4)
EOS: 3 %
HEMATOCRIT: 32.6 % — AB (ref 37.5–51.0)
Hemoglobin: 11.8 g/dL — ABNORMAL LOW (ref 13.0–17.7)
Immature Grans (Abs): 0.1 10*3/uL (ref 0.0–0.1)
Immature Granulocytes: 1 %
LYMPHS ABS: 0.9 10*3/uL (ref 0.7–3.1)
Lymphs: 18 %
MCH: 37.7 pg — ABNORMAL HIGH (ref 26.6–33.0)
MCHC: 36.2 g/dL — ABNORMAL HIGH (ref 31.5–35.7)
MCV: 104 fL — ABNORMAL HIGH (ref 79–97)
MONOCYTES: 7 %
MONOS ABS: 0.3 10*3/uL (ref 0.1–0.9)
Neutrophils Absolute: 3.6 10*3/uL (ref 1.4–7.0)
Neutrophils: 70 %
PLATELETS: 77 10*3/uL — AB (ref 150–379)
RBC: 3.13 x10E6/uL — AB (ref 4.14–5.80)
RDW: 16.7 % — AB (ref 12.3–15.4)
WBC: 5.1 10*3/uL (ref 3.4–10.8)

## 2017-06-05 LAB — COMPREHENSIVE METABOLIC PANEL
ALT: 11 IU/L (ref 0–44)
AST: 26 IU/L (ref 0–40)
Albumin/Globulin Ratio: 1.6 (ref 1.2–2.2)
Albumin: 4.1 g/dL (ref 3.5–5.5)
Alkaline Phosphatase: 200 IU/L — ABNORMAL HIGH (ref 39–117)
BUN/Creatinine Ratio: 16 (ref 9–20)
BUN: 14 mg/dL (ref 6–24)
Bilirubin Total: 3.1 mg/dL — ABNORMAL HIGH (ref 0.0–1.2)
CALCIUM: 9.3 mg/dL (ref 8.7–10.2)
CO2: 23 mmol/L (ref 20–29)
CREATININE: 0.87 mg/dL (ref 0.76–1.27)
Chloride: 107 mmol/L — ABNORMAL HIGH (ref 96–106)
GFR, EST AFRICAN AMERICAN: 112 mL/min/{1.73_m2} (ref >59)
GFR, EST NON AFRICAN AMERICAN: 97 mL/min/{1.73_m2} (ref >59)
GLOBULIN, TOTAL: 2.5 g/dL (ref 1.5–4.5)
Glucose: 77 mg/dL (ref 65–99)
Potassium: 3.8 mmol/L (ref 3.5–5.2)
Sodium: 145 mmol/L — ABNORMAL HIGH (ref 134–144)
TOTAL PROTEIN: 6.6 g/dL (ref 6.0–8.5)

## 2017-06-05 LAB — FOLATE RBC

## 2017-06-05 LAB — VITAMIN B12: Vitamin B-12: >2000 pg/mL — ABNORMAL HIGH (ref 232–1245)

## 2017-06-05 NOTE — Telephone Encounter (Signed)
Called [name] [name]. Gave message above. She needed to verify ribs fractured - read CT Chest report of #5, #6, #7 right ribs fractured. She will instruct in incentive spirometry - OK.

## 2017-06-05 NOTE — Telephone Encounter (Signed)
Copied from [place]. Topic: Quick Communication - See Telephone Encounter >> Jun 05, 2017  9:56 AM Ether Griffins B wrote: CRM for notification. See Telephone encounter for:  Patti with [facility] calling for Dr. Darliss Ridgel nurse in regards to this pt. Call back number [phone] 06/05/17.

## 2017-06-05 NOTE — Telephone Encounter (Signed)
Lab came to me for plts 77. Patient record rewiewed, has had thrombocytopenia over the past year, 82 is his lowest reading so far. Has been working with GI for new elevated LFTs, macrocytic anemia, and thrombocytopenia. Mild cirrhosis on previous imaging but most recent abd MRI liver was normal. GI's thoughts were NASH. Vitamin B12 normal, folate still pending. Macrocytic anemia stable. Sees Dr Tamala Julian (PCP) on 06/23/2017. No action taken at this time, GI and PCP aware and working up new abnormalities. Thrombocytopenia level not at risk for spontaneous bleed.

## 2017-06-06 ENCOUNTER — Telehealth: Payer: Self-pay

## 2017-06-06 DIAGNOSIS — N390 Urinary tract infection, site not specified: Secondary | ICD-10-CM | POA: Diagnosis not present

## 2017-06-06 DIAGNOSIS — B9561 Methicillin susceptible Staphylococcus aureus infection as the cause of diseases classified elsewhere: Secondary | ICD-10-CM | POA: Diagnosis not present

## 2017-06-06 DIAGNOSIS — Z452 Encounter for adjustment and management of vascular access device: Secondary | ICD-10-CM | POA: Diagnosis not present

## 2017-06-06 NOTE — Telephone Encounter (Signed)
Pt has ov on 12/31 with [facility]

## 2017-06-06 NOTE — Telephone Encounter (Signed)
Copied from [place]. Topic: General - Other >> Jun 06, 2017  8:58 AM Scherrie Gerlach wrote: Clair Gulling with [facility] called to report pt had fall on 12/13.  No broken skin or bruising.  Mild acheness in hip.  Pt reports he blacked out after standing up to walk. Today bp : seated 130/70  standing  110/70 Pt consistently has a 20 pt drop systolic when standing

## 2017-06-06 NOTE — Telephone Encounter (Signed)
[facility] forms signed and faxed.

## 2017-06-06 NOTE — Telephone Encounter (Signed)
Please schedule OV.  

## 2017-06-08 NOTE — Telephone Encounter (Signed)
Documentation noted; appreciate care for this patient.

## 2017-06-09 ENCOUNTER — Telehealth: Payer: Self-pay | Admitting: Family Medicine

## 2017-06-09 DIAGNOSIS — M25512 Pain in left shoulder: Secondary | ICD-10-CM | POA: Diagnosis not present

## 2017-06-09 NOTE — Telephone Encounter (Signed)
Copied from [place] [phone]. Topic: General - Other >> Jun 09, 2017  9:59 AM Darl Householder, RMA wrote: [name] [name] PT with [facility], due to chronic ortho static hypotension please consider use of compression hose  to reduce fall risk, if you agree please call pt wife to inform her how to acquire them, pt wife telephone [phone] [name] [name], any questions please call [phone]

## 2017-06-10 ENCOUNTER — Ambulatory Visit
Admission: RE | Admit: 2017-06-10 | Discharge: 2017-06-10 | Disposition: A | Discharge status: Home / Self Care | Payer: 59 | Source: Ambulatory Visit | Attending: Family Medicine | Admitting: Family Medicine

## 2017-06-10 DIAGNOSIS — K5730 Diverticulosis of large intestine without perforation or abscess without bleeding: Secondary | ICD-10-CM | POA: Diagnosis not present

## 2017-06-10 DIAGNOSIS — N2889 Other specified disorders of kidney and ureter: Secondary | ICD-10-CM

## 2017-06-10 MED ORDER — IOPAMIDOL (ISOVUE-300) INJECTION 61%: 100.0000 mL | Freq: Once | INTRAVENOUS | Status: DC | PRN

## 2017-06-11 DIAGNOSIS — N390 Urinary tract infection, site not specified: Secondary | ICD-10-CM | POA: Diagnosis not present

## 2017-06-11 DIAGNOSIS — B9561 Methicillin susceptible Staphylococcus aureus infection as the cause of diseases classified elsewhere: Secondary | ICD-10-CM | POA: Diagnosis not present

## 2017-06-11 DIAGNOSIS — Z452 Encounter for adjustment and management of vascular access device: Secondary | ICD-10-CM | POA: Diagnosis not present

## 2017-06-11 NOTE — Telephone Encounter (Signed)
Please advise if you would like pt to use.

## 2017-06-12 DIAGNOSIS — N200 Calculus of kidney: Secondary | ICD-10-CM | POA: Diagnosis not present

## 2017-06-12 DIAGNOSIS — D4101 Neoplasm of uncertain behavior of right kidney: Secondary | ICD-10-CM | POA: Diagnosis not present

## 2017-06-12 DIAGNOSIS — N281 Cyst of kidney, acquired: Secondary | ICD-10-CM | POA: Diagnosis not present

## 2017-06-13 ENCOUNTER — Telehealth: Payer: Self-pay | Admitting: Family Medicine

## 2017-06-13 DIAGNOSIS — N390 Urinary tract infection, site not specified: Secondary | ICD-10-CM | POA: Diagnosis not present

## 2017-06-13 DIAGNOSIS — Z452 Encounter for adjustment and management of vascular access device: Secondary | ICD-10-CM | POA: Diagnosis not present

## 2017-06-13 DIAGNOSIS — B9561 Methicillin susceptible Staphylococcus aureus infection as the cause of diseases classified elsewhere: Secondary | ICD-10-CM | POA: Diagnosis not present

## 2017-06-13 NOTE — Telephone Encounter (Signed)
Copied from [place] [phone]. Topic: Inquiry >> Jun 13, 2017  9:25 AM Malena Catholic I, NT wrote: Reason for CRM: [facility] call for physical therapy for the pt have a Fall on 06/11/17 his is ok but haves a sore L arm [place] like to have more therapy with him 3 more week 1 Time per week Any question please call Henderson Newcomer @ [phone]

## 2017-06-13 NOTE — Telephone Encounter (Signed)
Copied from [place] [phone]. Topic: Inquiry >> Jun 13, 2017  9:25 AM Malena Catholic I, NT wrote: Reason for CRM: [facility] call for physical therapy for the pt have a Fall on 06/11/17 his is ok but haves a sore L arm [place] like to have more therapy with him 3 more week 1 Time per week Any question please call Henderson Newcomer @ [phone]

## 2017-06-18 ENCOUNTER — Ambulatory Visit: Payer: 59 | Admitting: Family Medicine

## 2017-06-20 NOTE — Telephone Encounter (Signed)
Provider informed and approve

## 2017-06-23 ENCOUNTER — Other Ambulatory Visit: Payer: Self-pay

## 2017-06-23 ENCOUNTER — Encounter: Payer: Self-pay | Admitting: Family Medicine

## 2017-06-23 ENCOUNTER — Ambulatory Visit (INDEPENDENT_AMBULATORY_CARE_PROVIDER_SITE_OTHER): Payer: 59 | Admitting: Family Medicine

## 2017-06-23 VITALS — BP 122/82 | HR 75 | Temp 97.8°F | Resp 16 | Wt 203.0 lb

## 2017-06-23 DIAGNOSIS — R748 Abnormal levels of other serum enzymes: Secondary | ICD-10-CM | POA: Diagnosis not present

## 2017-06-23 DIAGNOSIS — D696 Thrombocytopenia, unspecified: Secondary | ICD-10-CM | POA: Diagnosis not present

## 2017-06-23 DIAGNOSIS — R296 Repeated falls: Secondary | ICD-10-CM | POA: Diagnosis not present

## 2017-06-23 DIAGNOSIS — R0902 Hypoxemia: Secondary | ICD-10-CM | POA: Diagnosis not present

## 2017-06-23 DIAGNOSIS — S2241XD Multiple fractures of ribs, right side, subsequent encounter for fracture with routine healing: Secondary | ICD-10-CM | POA: Diagnosis not present

## 2017-06-23 DIAGNOSIS — R42 Dizziness and giddiness: Secondary | ICD-10-CM | POA: Diagnosis not present

## 2017-06-23 DIAGNOSIS — Z23 Encounter for immunization: Secondary | ICD-10-CM

## 2017-06-23 DIAGNOSIS — N281 Cyst of kidney, acquired: Secondary | ICD-10-CM

## 2017-06-23 DIAGNOSIS — F321 Major depressive disorder, single episode, moderate: Secondary | ICD-10-CM

## 2017-06-23 DIAGNOSIS — S42145D Nondisplaced fracture of glenoid cavity of scapula, left shoulder, subsequent encounter for fracture with routine healing: Secondary | ICD-10-CM

## 2017-06-23 DIAGNOSIS — E119 Type 2 diabetes mellitus without complications: Secondary | ICD-10-CM | POA: Diagnosis not present

## 2017-06-23 NOTE — Telephone Encounter (Signed)
Copied from [place]. Topic: General - Other >> Jun 23, 2017  4:16 PM Neva Seat wrote: [facility] - [phone] Faxed  Orders Dec. 22  Viestibular evaluation pt due to dizziness with head turns. Please consider PRN as needed. Meclizine to reduse dissizness symptoms  Please call Clair Gulling back asap.

## 2017-06-23 NOTE — Patient Instructions (Signed)
     IF you received an x-ray today, you will receive an invoice from [facility]. Please contact [facility] at [phone] with questions or concerns regarding your invoice.   IF you received labwork today, you will receive an invoice from [facility]. Please contact [facility] at [phone] with questions or concerns regarding your invoice.   Our billing staff will not be able to assist you with questions regarding bills from these companies.  You will be contacted with the lab results as soon as they are available. The fastest way to get your results is to activate your My Chart account. Instructions are located on the last page of this paperwork. If you have not heard from us regarding the results in 2 weeks, please contact this office.     

## 2017-06-23 NOTE — Progress Notes (Signed)
Subjective:    Patient ID: [name] [name], male    DOB: 07/06/1960, 56 y.o.   MRN: [id number]  06/23/2017  Chronic Conditions (1 month follow-up ); Rib Fracture; Dizziness; and Fall    HPI This 56 y.o. male presents for three week follow-up of multiple acute issues:  L scapular fracture: pain is improving yet range of motion slowly improving.  Sleeping better; once falls asleep, sleeping well.  There are a few times mind is racing.  Mom died right before Christmas and father had AAA on 05-Jul-2023; grandfather died on Christmas day. Gloomy time of  Year for patient.   Physical therapist visits are about to expire; physical therapist may want to extend treatment.  Ambulating to [facility] basketball games with daughter.  A lot of talking about how to proceed.  Discusses slowing down.  If feeling dizzy or lightheaded, stop.  Avoid sudden movements.     Multiple rib fractures: still sore on RIGHT; noticing pain on RIGHT ribs now that L shoulder pain.  Using incentive spirometer.  Hypoxia: using incentive spirometer daily No SOB.  No further low oxygen levels.  Orthostatic dizziness/hypotension/falls: likes compression stockings; not sure if helping prevent dizziness.  Rapid movements cause dizziness.  Physical therapist suggested vestibular testing.  Turning to the LEFT, always seems to trigger dizziness.  Physical therapist also noticed this.  Has not specifically mentioned to Dr. Krista Blue regarding dizziness.  Sees Dr. Krista Blue in April.   Doing better.  Being smarter right now; wife has been home for ten days during the holidays.  Better.   A lot is common sense; has issues; needs to slow down and be more careful.  No occupational therapy and physical therapy.   Anxiety and depression: sees Dr. Loni Muse at end of the month.  Dr. Loni Muse advised to never drive again.  Wife will not allow to drive.  No active SI; desires to live 50-60% of the time right now which is significant improvement.  Insomnia; non longer taking  hydrocodone.  Taking Lunesta and Trazodone 166m qhs.    Renal lesions: s/p follow-up with urology to review CT renal results.  Recommend repeat in six months.  Crohns: sees Dr. SJacinto HalimWednesday.  Overdue to have colonoscopy.  Gut has normalized; went eleven days without having a bowel movement.    Cirrhosis/splenomegaly: Has upcoming appointment with Dr. SFuller Planto discuss cirrhosis and splenomegaly.   BP Readings from Last 3 Encounters:  06/23/17 122/82  06/04/17 134/78  05/23/17 128/70   Wt Readings from Last 3 Encounters:  06/25/17 208 lb (94.3 kg)  06/23/17 203 lb (92.1 kg)  06/04/17 200 lb (90.7 kg)   Immunization History  Administered Date(s) Administered  . Influenza Split 03/24/2013, 03/24/2014, 03/26/2016  . Influenza Whole 03/24/2012  . Influenza,inj,Quad PF,6+ Mos 02/16/2015  . Influenza-Unspecified 04/15/2017  . Pneumococcal Conjugate-13 07/07/2014  . Pneumococcal Polysaccharide-23 06/08/2007, 06/23/2017  . Tdap 05/15/2016  . Zoster 08/18/2014    Review of Systems  Constitutional: Negative for activity change, appetite change, chills, diaphoresis, fatigue and fever.  HENT: Negative for congestion, sinus pressure and sinus pain.   Respiratory: Negative for cough, shortness of breath, wheezing and stridor.   Cardiovascular: Negative for chest pain, palpitations and leg swelling.  Gastrointestinal: Positive for diarrhea. Negative for abdominal pain, anal bleeding, blood in stool, constipation, nausea, rectal pain and vomiting.  Endocrine: Negative for cold intolerance, heat intolerance, polydipsia, polyphagia and polyuria.  Genitourinary: Negative for dysuria.  Musculoskeletal: Positive for arthralgias.  Skin:  Negative for color change, rash and wound.  Neurological: Positive for dizziness. Negative for tremors, seizures, syncope, facial asymmetry, speech difficulty, weakness, light-headedness, numbness and headaches.  Psychiatric/Behavioral: Positive for dysphoric  mood and sleep disturbance. The patient is nervous/anxious.     Past Medical History:  Diagnosis Date  . Adenomatous colon polyp 10/1999  . Anxiety   . Arthritis    neck, yoga helps.  . Cancer ([place])    skin  . Cataract   . Complex partial seizure ([place])   . Crohn's disease of small and large intestines ([place]) 1999  . Depression   . Diabetes mellitus   . Esophageal stricture   . External hemorrhoids   . GERD (gastroesophageal reflux disease)   . History of kidney stones    3 large stones still present  . History of nuclear stress test 11/15/2010   exercise myoview; normal pattern of perfusion; normal, low risk study   . Hyperlipemia   . Hypertension   . Migraines   . MRSA (methicillin resistant Staphylococcus aureus) 10/2010   . Osteoporosis   . Pneumonia   . PONV (postoperative nausea and vomiting)   . PTSD (post-traumatic stress disorder)   . Renal lesion 05/28/2017  . Seizures ([place])   . Skin cancer    squamous cell multiple; [place]; followed every 3 months.  . Sleep apnea    CPAP machine, uses nightly  . Staphylococcus aureus bacteremia with sepsis ([place]) 05/28/2017   Past Surgical History:  Procedure Laterality Date  . CATARACT EXTRACTION Bilateral   . ELBOW SURGERY  2012   elbow MRSA infection   . EYE SURGERY     cataracts removed, /w IOL  . INSERTION OF MESH N/A 07/01/2016   Procedure: INSERTION OF MESH;  Surgeon: [name], MD;  Location: [facility];  Service: General;  Laterality: N/A;  . SHOULDER SURGERY    . SINUS SURGERY WITH INSTATRAK    . TEE WITHOUT CARDIOVERSION N/A 05/09/2017   Procedure: TRANSESOPHAGEAL ECHOCARDIOGRAM (TEE);  Surgeon: [name], MD;  Location: [facility] ENDOSCOPY;  Service: Cardiovascular;  Laterality: N/A;  . TRANSTHORACIC ECHOCARDIOGRAM  02/22/2011   EF 55-65%; increased pattern of LVH with mild conc hypertrophy, abnormal relaxation & increased filling pressure (grade 2 diastolic dysfunction); atrial septum thickened (lipomatous  hypertrophy)  . UMBILICAL HERNIA REPAIR N/A 07/01/2016   Procedure: UMBILICAL HERNIA REPAIR WITH MESH;  Surgeon: [name], MD;  Location: [facility];  Service: General;  Laterality: N/A;  . VASECTOMY     Allergies  Allergen Reactions  . Azithromycin Swelling    SWELLING REACTION UNSPECIFIED   . Beef-Derived Products Diarrhea    RED MEAT > UNSPECIFIED REACTION   . Milk-Related Compounds Diarrhea    DAIRY >UNSPECIFIED REACTION   . Penicillins Hives    Rash as child- tolerates Ancef Has patient had a PCN reaction causing immediate rash, facial/tongue/throat swelling, SOB or lightheadedness with hypotension: YES Has patient had a PCN reaction causing severe rash involving mucus membranes or skin necrosis: Unknown Has patient had a PCN reaction that required hospitalization: Unknown Has patient had a PCN reaction occurring within the last 10 years:Unknown If all of the above answers are "NO", then may proceed with Cephalosporin use.    . Sulfonamide Derivatives Hives  . Dilaudid [Hydromorphone Hcl] Nausea And Vomiting   Current Outpatient Medications on File Prior to Visit  Medication Sig Dispense Refill  . Ascorbic Acid (VITAMIN C PO) Take 1 tablet at bedtime by mouth.    Marland Kitchen  aspirin EC 81 MG tablet Take 81 mg at bedtime by mouth.     Marland Kitchen azelastine (ASTELIN) 0.1 % nasal spray Place 2 sprays into both nostrils 2 (two) times daily. Use in each nostril as directed (Patient taking differently: Place 2 sprays daily into both nostrils. ) 90 mL 3  . Calcium Carb-Cholecalciferol (CALCIUM-VITAMIN D) 500-200 MG-UNIT tablet Take 1 tablet 2 (two) times daily by mouth.    . cholecalciferol (VITAMIN D) 1000 units tablet Take 1,000 Units at bedtime by mouth.     . diazepam (VALIUM) 10 MG tablet TK 1 T PO 1 HOUR PRIOR TO PROCEDURE. REPEAT PRN  0  . Eszopiclone 3 MG TABS Take 1 tablet (3 mg total) by mouth at bedtime. Take immediately before bedtime 30 tablet 1  . ferrous sulfate 325 (65 FE) MG tablet Take  325 mg at bedtime by mouth.     . folic acid (FOLVITE) 1 MG tablet Take 2 tablets (2 mg total) by mouth daily. 180 tablet 3  . glucose blood (CHOICE DM FORA G20 TEST STRIPS) test strip Use as instructed 100 each 12  . Ibuprofen-Diphenhydramine Cit (ADVIL PM PO) Take 1 tablet at bedtime by mouth.     . Lactase (DAIRY-RELIEF PO) Take 2 tablets by mouth 2 (two) times daily.     Marland Kitchen lamoTRIgine (LAMICTAL) 100 MG tablet Take 1 tablet (100 mg total) by mouth 2 (two) times daily. 180 tablet 3  . loratadine (CLARITIN) 10 MG tablet Take 10 mg at bedtime by mouth.     . mercaptopurine (PURINETHOL) 50 MG tablet FOR DIRECTIONS ON HOW TO   TAKE THIS MEDICINE, READ   THE ENCLOSED MEDICATION    INFORMATION FORM 180 tablet 0  . mesalamine (LIALDA) 1.2 g EC tablet Take 2 tablets (2.4 g total) by mouth daily. (Patient taking differently: Take 1.2 g 2 (two) times daily by mouth. ) 180 tablet 1  . methscopolamine (PAMINE FORTE) 5 MG tablet Take 1 tablet (5 mg total) by mouth 2 (two) times daily. 180 tablet 1  . ONE TOUCH ULTRA TEST test strip USE AS INSTRUCTED 100 each 2  . potassium chloride SA (K-DUR,KLOR-CON) 20 MEQ tablet Take 1 tablet (20 mEq total) by mouth 2 (two) times daily. 180 tablet 3  . Probiotic Product (PROBIOTIC PO) Take 1 tablet at bedtime by mouth.     . sertraline (ZOLOFT) 100 MG tablet Take 200 mg daily with breakfast by mouth.     . traZODone (DESYREL) 100 MG tablet Take 50-100 mg at bedtime by mouth.     . diltiazem (TIAZAC) 180 MG 24 hr capsule     . HYDROcodone-acetaminophen (NORCO/VICODIN) 5-325 MG tablet Take 1 tablet by mouth 2 (two) times daily as needed. for pain  0  . naproxen (NAPROSYN) 500 MG tablet TK 1 T PO BID  0   Current Facility-Administered Medications on File Prior to Visit  Medication Dose [name] Frequency Provider Last Rate Last Dose  . gadopentetate dimeglumine (MAGNEVIST) injection 20 mL  20 mL Intravenous Once PRN Marcial Pacas, MD       Social History   Socioeconomic  History  . Marital status: Married    Spouse name: [name]  . Number of children: 2  . Years of education: college  . Highest education level: Not on file  Social Needs  . Financial resource strain: Not on file  . Food insecurity - worry: Not on file  . Food insecurity - inability: Not on file  .  Transportation needs - medical: Not on file  . Transportation needs - non-medical: Not on file  Occupational History  . Occupation: disabled    Comment: seizure disorder with memory impairment  Tobacco Use  . Smoking status: Never Smoker  . Smokeless tobacco: Never Used  Substance and Sexual Activity  . Alcohol use: No  . Drug use: No  . Sexual activity: Yes    Birth control/protection: Surgical    Comment: 1 partner in last 12 months  Other Topics Concern  . Not on file  Social History Narrative   Marital status: married x 32 years;       Lives:   lives at home with his family.      Children:  two children; no grandchildren.  Son is special needs at age 56yo; Cornelian Delaine with TOF.       Employment: unemployed; [employer] long term disability      Tobacco: none      Alcohol: none      Drugs: none      Exercise: unable to exercise due to frequent falls   Patient has a college education.   Patient is right-handed.   Patient drinks one cup of soda daily.   Family History  Problem Relation Age of Onset  . Colon polyps Father   . Stroke Father   . Hyperlipidemia Father   . Hypertension Father   . Heart disease Mother        CABG at age 48  . Hyperlipidemia Mother   . Hypertension Mother   . Stroke Paternal Grandmother   . Stroke Paternal Grandfather   . Other Child        tetrology of fallot (cornealia deland syndrome)  . Colon cancer Neg Hx        Objective:    BP 122/82   Pulse 75   Temp 97.8 F (36.6 C) (Oral)   Resp 16   Wt 203 lb (92.1 kg)   SpO2 96%   BMI 27.80 kg/m  Physical Exam  Constitutional: He is oriented to person, place, and time. He  appears well-developed and well-nourished. No distress.  HENT:  Head: Normocephalic and atraumatic.  Right Ear: External ear normal.  Left Ear: External ear normal.  Nose: Nose normal.  Mouth/Throat: Oropharynx is clear and moist.  Eyes: Conjunctivae and EOM are normal. Pupils are equal, round, and reactive to light.  Neck: Normal range of motion. Neck supple. Carotid bruit is not present. No thyromegaly present.  Cardiovascular: Normal rate, regular rhythm, normal heart sounds and intact distal pulses. Exam reveals no gallop and no friction rub.  No murmur heard. Pulmonary/Chest: Effort normal and breath sounds normal. He has no wheezes. He has no rales.  Abdominal: Soft. Bowel sounds are normal. He exhibits no distension and no mass. There is no tenderness. There is no rebound and no guarding.  Musculoskeletal:       Left shoulder: He exhibits decreased range of motion and pain.  Lymphadenopathy:    He has no cervical adenopathy.  Neurological: He is alert and oriented to person, place, and time. No cranial nerve deficit.  Skin: Skin is warm and dry. No rash noted. He is not diaphoretic.  Psychiatric: He has a normal mood and affect. His behavior is normal.  Nursing note and vitals reviewed.  No results found. Depression screen [facility] 2/9 06/04/2017 05/28/2017 05/23/2017 02/11/2017 11/19/2016  Decreased Interest 0 0 3 1 0  Down, Depressed, Hopeless 0 0 3  1 0  PHQ - 2 Score 0 0 6 2 0  Altered sleeping - - - 0 -  Tired, decreased energy - 0 1 1 -  Change in appetite - 0 1 1 -  Feeling bad or failure about yourself  - 0 0 1 -  Trouble concentrating - 0 0 1 -  Moving slowly or fidgety/restless - 0 0 0 -  Suicidal thoughts - 0 0 0 -  PHQ-9 Score - - - 6 -  Difficult doing work/chores - - - - -  Some recent data might be hidden   Fall Risk  06/04/2017 05/28/2017 05/23/2017 02/11/2017 11/19/2016  Falls in the past year? Yes Yes Yes Yes Yes  Number falls in past yr: 2 or more 2 or more 2 or  more 2 or more 2 or more  Injury with Fall? - Yes Yes No Yes  Comment - - - - -  Risk Factor Category  - High Fall Risk High Fall Risk High Fall Risk -  Risk for fall due to : - Impaired balance/gait;Medication side effect - - -        Assessment & Plan:   1. Hypoxia   2. Dizziness   3. Thrombocytopenia ([place])   4. Type 2 diabetes mellitus without complication, without long-term current use of insulin (HCC)   5. Elevated alkaline phosphatase level   6. Current moderate episode of major depressive disorder without prior episode (HCC)   7. Closed fracture of multiple ribs of right side with routine healing, subsequent encounter   8. Closed nondisplaced fracture of glenoid cavity of left scapula with routine healing, subsequent encounter   9. Need for prophylactic vaccination against Streptococcus pneumoniae (pneumococcus)   10. Frequent falls     Hypoxia has improved with improved pain control of scapular fracture and multiple rib fractures.  Hypoxia most consistent with atelectasis from not taking deep inspiration due to pain.  Incentive spirometry has improved significantly.  No respiratory distress in office.  Lungs are clear.  Persistent dizziness causing frequent falls.  His school therapist has appreciated a vestibular component to dizziness.  Therapy recommending vestibular rehabilitation and ENT consultation.  Agreeable to ENT consultation at this time.  Referral placed.  Patient and wife do not feel that placement in a rehabilitation facility is warranted at this time.  Patient is transferring and ambulating with caution and slowly which has helped decrease frequency of falls.  Patient healing slowly yet with improvement in pain due to recent left scapular fracture and right multiple rib fractures.  Followed by orthopedist.  Needing opiates less frequently at this time.  Status post recent CT that revealed cirrhosis of the liver with elevated alkaline phosphatase and  thrombocytopenia.  Patient is followed closely by Dr. Fuller Plan of gastroenterology who has been monitoring these parameters for the past several years.  Labs more abnormal in the past month.  Patient to discuss at upcoming appointment with Dr. Fuller Plan.  Status post CT of LEFT renal lesions which are consistent with simple cysts.  Urology recommends repeating CT in 6 months.  No indication for biopsy or surgical intervention at this time.  -prolonged face-to-face for 40 minutes with greater than 50% of time dedicated to counseling and coordination of care.  Orders Placed This Encounter  Procedures  . Pneumococcal polysaccharide vaccine 23-valent greater than or equal to 2yo subcutaneous/IM  . CBC with Differential/Platelet  . Comprehensive metabolic panel  . Ambulatory referral to ENT    Referral  Priority:   Routine    Referral Type:   Consultation    Referral Reason:   Specialty Services Required    Requested Specialty:   Otolaryngology    Number of Visits Requested:   1   No orders of the defined types were placed in this encounter.   Return in about 4 weeks (around 07/21/2017) for follow-up chronic medical conditions.   Adlene Adduci Elayne Guerin, M.D. Primary Care at [facility] [address] [place], [place]  [address] [phone] phone [phone] fax

## 2017-06-24 LAB — COMPREHENSIVE METABOLIC PANEL
ALBUMIN: 4.2 g/dL (ref 3.5–5.5)
ALK PHOS: 173 IU/L — AB (ref 39–117)
ALT: 15 IU/L (ref 0–44)
AST: 28 IU/L (ref 0–40)
Albumin/Globulin Ratio: 1.9 (ref 1.2–2.2)
BUN / CREAT RATIO: 11 (ref 9–20)
BUN: 11 mg/dL (ref 6–24)
Bilirubin Total: 2.8 mg/dL — ABNORMAL HIGH (ref 0.0–1.2)
CALCIUM: 9 mg/dL (ref 8.7–10.2)
CO2: 19 mmol/L — AB (ref 20–29)
CREATININE: 0.97 mg/dL (ref 0.76–1.27)
Chloride: 108 mmol/L — ABNORMAL HIGH (ref 96–106)
GFR calc Af Amer: 101 mL/min/{1.73_m2} (ref >59)
GFR, EST NON AFRICAN AMERICAN: 88 mL/min/{1.73_m2} (ref >59)
GLUCOSE: 75 mg/dL (ref 65–99)
Globulin, Total: 2.2 g/dL (ref 1.5–4.5)
Potassium: 3.6 mmol/L (ref 3.5–5.2)
Sodium: 143 mmol/L (ref 134–144)
TOTAL PROTEIN: 6.4 g/dL (ref 6.0–8.5)

## 2017-06-24 LAB — CBC WITH DIFFERENTIAL/PLATELET
BASOS: 1 %
Basophils Absolute: 0 10*3/uL (ref 0.0–0.2)
EOS (ABSOLUTE): 0.2 10*3/uL (ref 0.0–0.4)
EOS: 5 %
HEMATOCRIT: 30.3 % — AB (ref 37.5–51.0)
HEMOGLOBIN: 11.5 g/dL — AB (ref 13.0–17.7)
IMMATURE GRANS (ABS): 0 10*3/uL (ref 0.0–0.1)
Immature Granulocytes: 1 %
LYMPHS ABS: 0.7 10*3/uL (ref 0.7–3.1)
LYMPHS: 19 %
MCH: 38.7 pg — ABNORMAL HIGH (ref 26.6–33.0)
MCHC: 38 g/dL — AB (ref 31.5–35.7)
MCV: 102 fL — ABNORMAL HIGH (ref 79–97)
MONOCYTES: 11 %
Monocytes Absolute: 0.4 10*3/uL (ref 0.1–0.9)
NEUTROS ABS: 2.2 10*3/uL (ref 1.4–7.0)
Neutrophils: 63 %
Platelets: 114 10*3/uL — ABNORMAL LOW (ref 150–379)
RBC: 2.97 x10E6/uL — ABNORMAL LOW (ref 4.14–5.80)
RDW: 16.7 % — ABNORMAL HIGH (ref 12.3–15.4)
WBC: 3.4 10*3/uL (ref 3.4–10.8)

## 2017-06-25 ENCOUNTER — Ambulatory Visit (AMBULATORY_SURGERY_CENTER): Payer: Self-pay

## 2017-06-25 ENCOUNTER — Telehealth: Payer: Self-pay

## 2017-06-25 VITALS — Ht 71.0 in | Wt 208.0 lb

## 2017-06-25 DIAGNOSIS — Z86010 Personal history of colonic polyps: Secondary | ICD-10-CM

## 2017-06-25 NOTE — Telephone Encounter (Signed)
He needs to reschedule due to the recent seizure which should delay an elective procedure about 3 months. He should have an office visit in about 2 months.

## 2017-06-25 NOTE — Telephone Encounter (Signed)
Spoke with wife [name]) and scheduled pt for an OV on 08/27/17 with Dr Fuller Plan, to evaluate for a colon. Colon on 07/08/17 was cx'd. The wife will call back if she has further questions. Gwyndolyn Saxon

## 2017-06-25 NOTE — Progress Notes (Signed)
Per pt, no allergies to soy or egg products.Pt not taking any weight loss meds or using  O2 at home.   Pt refused Emmi video.  Pt came into the office today for his PV prior to his colon on 07/08/17. Pt states he had a seizure 2 weeks ago and was in the hospital 6 days due to a staph infection.  Pt was unable to answer some medical questions due to memory loss at the time. Per Dr Fuller Plan, pt was scheduled for an OV on 08/27/17 to evaluate  for colon. Spoke with wife regarding appt and informed her to have someone come with pt to his appt. She understood.She will call back if she has further questions.

## 2017-06-25 NOTE — Telephone Encounter (Signed)
Message sent to Dr. Tamala Julian

## 2017-06-25 NOTE — Telephone Encounter (Signed)
Dr Fuller Plan, This pt is scheduled for his colon with you on 07/08/17. He is here for his PV now. After asking several questions, pt states he had his last seizure 2 weeks ago, does take Lamictal 100 mg twice a day. He was in the hospital for 6 days on Dec 8th 2018, due to a staph infection. He finished his last antibiotic [id number]/[id number]. Pt does have some memory problems and can not answer some questions today. Does the pt need to reschedule his colon until a later time, or schedule an OV to be evaluated. Please advise. Thanks, Gwyndolyn Saxon

## 2017-06-26 ENCOUNTER — Other Ambulatory Visit: Payer: 59

## 2017-06-26 DIAGNOSIS — K5080 Crohn's disease of both small and large intestine without complications: Secondary | ICD-10-CM | POA: Diagnosis not present

## 2017-06-26 DIAGNOSIS — B9561 Methicillin susceptible Staphylococcus aureus infection as the cause of diseases classified elsewhere: Secondary | ICD-10-CM

## 2017-06-26 DIAGNOSIS — R7881 Bacteremia: Secondary | ICD-10-CM

## 2017-06-26 DIAGNOSIS — A4101 Sepsis due to Methicillin susceptible Staphylococcus aureus: Secondary | ICD-10-CM

## 2017-06-27 NOTE — Telephone Encounter (Signed)
Patient evaluated in office this week.  Referral to ENT due to concern for vestibular component to dizziness.  Will not prescribe Meclizine due to memory loss and frequent falls and recent opiate use for rib fractures and scapular fractures.  Spoke with patient at visit regarding discontinuation of physical therapy.

## 2017-06-27 NOTE — Telephone Encounter (Signed)
Copied from [place]. Topic: General - Other >> Jun 27, 2017 12:16 PM Aurelio Brash B wrote: Pt has chosen to stop home health therapy and being discharged from all home health services       Clair Gulling at [place]

## 2017-07-02 NOTE — Telephone Encounter (Unsigned)
Copied from [place] [id number]. Topic: Referral - Request >> Jul 02, 2017 12:23 PM Ivar Drape wrote: Reason for CRM: Patient said when he was in to see Dr. Tamala Julian on 06/04/17 she mentioned referring him to Dr. Jodi Marble, [address] [place], [place], [place] [address]  Ph: [phone] and Fax: [phone].  Patient said he has not heard anything about the referral.  Please advise.

## 2017-07-03 ENCOUNTER — Other Ambulatory Visit: Payer: Self-pay | Admitting: Gastroenterology

## 2017-07-04 LAB — CULTURE BLOOD MANUAL
Micro Number: 90020058
Micro Number: 90020059
RESULT 123: NO GROWTH
Result: NO GROWTH
SPECIMEN QUALITY 12: ADEQUATE
Specimen Quality: ADEQUATE

## 2017-07-08 ENCOUNTER — Encounter: Payer: 59 | Admitting: Gastroenterology

## 2017-07-08 ENCOUNTER — Encounter: Payer: Self-pay | Admitting: Family Medicine

## 2017-07-08 DIAGNOSIS — R296 Repeated falls: Secondary | ICD-10-CM | POA: Insufficient documentation

## 2017-07-08 DIAGNOSIS — N281 Cyst of kidney, acquired: Secondary | ICD-10-CM

## 2017-07-08 HISTORY — DX: Cyst of kidney, acquired: N28.1

## 2017-07-09 DIAGNOSIS — M25512 Pain in left shoulder: Secondary | ICD-10-CM | POA: Diagnosis not present

## 2017-07-16 DIAGNOSIS — R42 Dizziness and giddiness: Secondary | ICD-10-CM | POA: Diagnosis not present

## 2017-07-16 DIAGNOSIS — G4733 Obstructive sleep apnea (adult) (pediatric): Secondary | ICD-10-CM | POA: Diagnosis not present

## 2017-07-16 DIAGNOSIS — J310 Chronic rhinitis: Secondary | ICD-10-CM | POA: Diagnosis not present

## 2017-07-18 ENCOUNTER — Telehealth: Payer: Self-pay | Admitting: Gastroenterology

## 2017-07-18 MED ORDER — MERCAPTOPURINE 50 MG PO TABS: ORAL_TABLET | ORAL | 1 refills | Status: DC

## 2017-07-18 NOTE — Telephone Encounter (Signed)
Prescription sent to patient's pharmacy until scheduled appt on 08/27/17.

## 2017-07-21 ENCOUNTER — Telehealth: Payer: Self-pay | Admitting: Gastroenterology

## 2017-07-21 MED ORDER — METHSCOPOLAMINE BROMIDE 5 MG PO TABS: 5.0000 mg | ORAL_TABLET | Freq: Two times a day (BID) | ORAL | 0 refills | Status: DC

## 2017-07-21 MED ORDER — METHSCOPOLAMINE BROMIDE 5 MG PO TABS: 5.0000 mg | ORAL_TABLET | Freq: Two times a day (BID) | ORAL | 3 refills | Status: DC

## 2017-07-21 NOTE — Telephone Encounter (Signed)
Prescription sent to patient's pharmacy.

## 2017-07-26 ENCOUNTER — Encounter: Payer: Self-pay | Admitting: Family Medicine

## 2017-07-26 ENCOUNTER — Ambulatory Visit (INDEPENDENT_AMBULATORY_CARE_PROVIDER_SITE_OTHER): Payer: 59 | Admitting: Family Medicine

## 2017-07-26 VITALS — BP 116/58 | HR 73 | Temp 98.0°F | Resp 18 | Ht 71.0 in | Wt 203.4 lb

## 2017-07-26 DIAGNOSIS — R740 Nonspecific elevation of levels of transaminase and lactic acid dehydrogenase [LDH]: Secondary | ICD-10-CM

## 2017-07-26 DIAGNOSIS — R296 Repeated falls: Secondary | ICD-10-CM | POA: Diagnosis not present

## 2017-07-26 DIAGNOSIS — F321 Major depressive disorder, single episode, moderate: Secondary | ICD-10-CM

## 2017-07-26 DIAGNOSIS — R569 Unspecified convulsions: Secondary | ICD-10-CM

## 2017-07-26 DIAGNOSIS — R7881 Bacteremia: Secondary | ICD-10-CM

## 2017-07-26 DIAGNOSIS — G40209 Localization-related (focal) (partial) symptomatic epilepsy and epileptic syndromes with complex partial seizures, not intractable, without status epilepticus: Secondary | ICD-10-CM | POA: Diagnosis not present

## 2017-07-26 DIAGNOSIS — R413 Other amnesia: Secondary | ICD-10-CM

## 2017-07-26 DIAGNOSIS — R42 Dizziness and giddiness: Secondary | ICD-10-CM | POA: Diagnosis not present

## 2017-07-26 DIAGNOSIS — K5080 Crohn's disease of both small and large intestine without complications: Secondary | ICD-10-CM | POA: Diagnosis not present

## 2017-07-26 DIAGNOSIS — H903 Sensorineural hearing loss, bilateral: Secondary | ICD-10-CM

## 2017-07-26 DIAGNOSIS — I10 Essential (primary) hypertension: Secondary | ICD-10-CM

## 2017-07-26 DIAGNOSIS — R7402 Elevation of levels of lactic acid dehydrogenase (LDH): Secondary | ICD-10-CM

## 2017-07-26 NOTE — Progress Notes (Signed)
Subjective:    Patient ID: [name] [name], male    DOB: 13-Jun-1961, 57 y.o.   MRN: [id number]  07/26/2017  Dizziness (Follow up, recently saw ENT) and Depression    HPI This 57 y.o. male presents for follow-up evaluation of dizziness.  No vertigo identified by Dr. Erik Obey.  Dizzy all the time; falls frequently.  Falls against the wall.  Must catch self going up and down the stairs.  Memory loss is significant; last visit with Dr. Krista Blue in October 2018.  MMSE 03/2017 25.  Very forgetful; forgets dog's name.  Identifies wrong words. [place] for dizziness in March 2018.   Dr. Krista Blue feels that stress induced and related to depression/anxiety. MRI brain 10/2015. Slow with ambulation.  Six months ago could walk much faster.  Between weather, staph infection, rib fractures, clavicular fracture, has become greatly deconditioned.  Depression: worsening. Has disability hearing in three months and is having excessive worry regarding hearing.  Dr. Loni Muse Just increased Trazodone to 167m to help with depression.  Lunesta at night; Zoloft in morning.  Seeing therapist regularly.  Increased Trazodone due to insomnia, depression. Cannot shut off brain at night.  Walked yesterday but fell twice.  Wants to walk the dog; so afraid to take cane with dog.  Hiking stick has strap.  Will try to walk with hiking stick.  [place]  Embarrassed by handicap status.    Hearing loss; getting hearing aides per recommendations of Dr. WArchie Patten  Went to preop for colonoscopy on 06/25/17; to delay colonoscopy.  Follow-up in march to see [place].   BP Readings from Last 3 Encounters:  07/29/17 111/63  07/26/17 (!) 116/58  06/23/17 122/82   Wt Readings from Last 3 Encounters:  07/29/17 200 lb (90.7 kg)  07/26/17 203 lb 6.4 oz (92.3 kg)  06/25/17 208 lb (94.3 kg)   Immunization History  Administered Date(s) Administered  . Influenza Split 03/24/2013, 03/24/2014, 03/26/2016  . Influenza Whole 03/24/2012  .  Influenza,inj,Quad PF,6+ Mos 02/16/2015  . Influenza-Unspecified 04/15/2017  . Pneumococcal Conjugate-13 07/07/2014  . Pneumococcal Polysaccharide-23 06/08/2007, 06/23/2017  . Tdap 05/15/2016  . Zoster 08/18/2014    Review of Systems  Constitutional: Negative for activity change, appetite change, chills, diaphoresis, fatigue and fever.  HENT: Positive for congestion and hearing loss. Negative for ear pain, facial swelling, postnasal drip, rhinorrhea, sinus pressure, sinus pain, sneezing and sore throat.   Respiratory: Negative for cough and shortness of breath.   Cardiovascular: Negative for chest pain, palpitations and leg swelling.  Gastrointestinal: Positive for diarrhea. Negative for abdominal distention, abdominal pain, anal bleeding, blood in stool, constipation, nausea, rectal pain and vomiting.  Endocrine: Negative for cold intolerance, heat intolerance, polydipsia, polyphagia and polyuria.  Musculoskeletal: Positive for arthralgias, gait problem and myalgias.  Skin: Negative for color change, rash and wound.  Neurological: Positive for dizziness and light-headedness. Negative for tremors, seizures, syncope, facial asymmetry, speech difficulty, weakness, numbness and headaches.  Psychiatric/Behavioral: Positive for decreased concentration, dysphoric mood and sleep disturbance. Negative for self-injury and suicidal ideas. The patient is nervous/anxious.     Past Medical History:  Diagnosis Date  . Adenomatous colon polyp 10/1999  . Anxiety   . Arthritis    neck, yoga helps.  . Cancer ([facility]    skin  . Cataract   . Complex partial seizure ([place]   . Crohn's disease of small and large intestines ([place] 1999  . Depression   . Diabetes mellitus   . Esophageal  stricture   . External hemorrhoids   . GERD (gastroesophageal reflux disease)   . History of kidney stones    3 large stones still present  . Hyperlipemia   . Hypertension   . Migraines   . MRSA (methicillin resistant  Staphylococcus aureus) 10/2010   . Osteoporosis   . PONV (postoperative nausea and vomiting)   . PTSD (post-traumatic stress disorder)   . Renal cyst, acquired, left 07/08/2017  . Renal lesion 05/28/2017  . Seizures ([place])   . Skin cancer    squamous cell multiple; [facility]; followed every 3 months.  . Sleep apnea    CPAP machine, uses nightly  . Staphylococcus aureus bacteremia with sepsis ([place]) 05/28/2017   Past Surgical History:  Procedure Laterality Date  . CATARACT EXTRACTION Bilateral   . ELBOW SURGERY  2012   elbow MRSA infection   . EYE SURGERY     cataracts removed, /w IOL  . INSERTION OF MESH N/A 07/01/2016   Procedure: INSERTION OF MESH;  Surgeon: [name], MD;  Location: [facility];  Service: General;  Laterality: N/A;  . SHOULDER SURGERY    . SINUS SURGERY WITH INSTATRAK    . TEE WITHOUT CARDIOVERSION N/A 05/09/2017   Procedure: TRANSESOPHAGEAL ECHOCARDIOGRAM (TEE);  Surgeon: [name], MD;  Location: [facility] ENDOSCOPY;  Service: Cardiovascular;  Laterality: N/A;  . TRANSTHORACIC ECHOCARDIOGRAM  02/22/2011   EF 55-65%; increased pattern of LVH with mild conc hypertrophy, abnormal relaxation & increased filling pressure (grade 2 diastolic dysfunction); atrial septum thickened (lipomatous hypertrophy)  . UMBILICAL HERNIA REPAIR N/A 07/01/2016   Procedure: UMBILICAL HERNIA REPAIR WITH MESH;  Surgeon: [name], MD;  Location: [facility];  Service: General;  Laterality: N/A;  . VASECTOMY     Allergies  Allergen Reactions  . Azithromycin Swelling    SWELLING REACTION UNSPECIFIED   . Beef-Derived Products Diarrhea    RED MEAT > UNSPECIFIED REACTION   . Milk-Related Compounds Diarrhea    DAIRY >UNSPECIFIED REACTION   . Penicillins Hives    Rash as child- tolerates Ancef/CHILDHOOD ALLERGY Has patient had a PCN reaction causing immediate rash, facial/tongue/throat swelling, SOB or lightheadedness with hypotension: YES Has patient had a PCN reaction causing severe rash  involving mucus membranes or skin necrosis: Unknown Has patient had a PCN reaction that required hospitalization: Unknown Has patient had a PCN reaction occurring within the last 10 years:Unknown If all of the above answers are "NO", then may proceed with Cephalosporin [place]  . Sulfonamide Derivatives Hives  . Dilaudid [Hydromorphone Hcl] Nausea And Vomiting   Current Outpatient Medications on File Prior to Visit  Medication Sig Dispense Refill  . Ascorbic Acid (VITAMIN C PO) Take 1 tablet at bedtime by mouth.    Marland Kitchen aspirin EC 81 MG tablet Take 81 mg at bedtime by mouth.     . Calcium Carb-Cholecalciferol (CALCIUM-VITAMIN D) 500-200 MG-UNIT tablet Take 1 tablet 2 (two) times daily by mouth.    . cholecalciferol (VITAMIN D) 1000 units tablet Take 1,000 Units at bedtime by mouth.     . diazepam (VALIUM) 10 MG tablet TK 1 T PO 1 HOUR PRIOR TO PROCEDURE. REPEAT PRN  0  . esomeprazole (NEXIUM) 40 MG capsule TAKE 1 CAPSULE TWICE A DAY 180 capsule 0  . Eszopiclone 3 MG TABS Take 1 tablet (3 mg total) by mouth at bedtime. Take immediately before bedtime 30 tablet 1  . ferrous sulfate 325 (65 FE) MG tablet Take 325 mg at bedtime by mouth.     Marland Kitchen  folic acid (FOLVITE) 1 MG tablet Take 2 tablets (2 mg total) by mouth daily. 180 tablet 3  . glucose blood (CHOICE DM FORA G20 TEST STRIPS) test strip Use as instructed 100 each 12  . Ibuprofen-Diphenhydramine Cit (ADVIL PM PO) Take 1 tablet at bedtime by mouth.     . Lactase (DAIRY-RELIEF PO) Take 2 tablets by mouth 2 (two) times daily.     Marland Kitchen lamoTRIgine (LAMICTAL) 100 MG tablet Take 1 tablet (100 mg total) by mouth 2 (two) times daily. 180 tablet 3  . loratadine (CLARITIN) 10 MG tablet Take 10 mg at bedtime by mouth.     . mercaptopurine (PURINETHOL) 50 MG tablet FOR DIRECTIONS ON HOW TO   TAKE THIS MEDICINE, READ   THE ENCLOSED MEDICATION    INFORMATION FORM 60 tablet 1  . methscopolamine (PAMINE FORTE) 5 MG tablet Take 1 tablet (5 mg total) by mouth 2 (two)  times daily. 180 tablet 3  . ONE TOUCH ULTRA TEST test strip USE AS INSTRUCTED 100 each 2  . potassium chloride SA (K-DUR,KLOR-CON) 20 MEQ tablet Take 1 tablet (20 mEq total) by mouth 2 (two) times daily. 180 tablet 3  . Probiotic Product (PROBIOTIC PO) Take 1 tablet at bedtime by mouth.     . sertraline (ZOLOFT) 100 MG tablet Take 200 mg daily with breakfast by mouth.     . traZODone (DESYREL) 100 MG tablet Take 50-100 mg at bedtime by mouth.     . mesalamine (LIALDA) 1.2 g EC tablet Take 2 tablets (2.4 g total) by mouth daily. (Patient taking differently: Take 1.2 g 2 (two) times daily by mouth. ) 180 tablet 1  . naproxen (NAPROSYN) 500 MG tablet TK 1 T PO BID  0   Current Facility-Administered Medications on File Prior to Visit  Medication Dose [name] Frequency Provider Last Rate Last Dose  . gadopentetate dimeglumine (MAGNEVIST) injection 20 mL  20 mL Intravenous Once PRN Marcial Pacas, MD       Social History   Socioeconomic History  . Marital status: Married    Spouse name: [name]  . Number of children: 2  . Years of education: college  . Highest education level: Not on file  Social Needs  . Financial resource strain: Not on file  . Food insecurity - worry: Not on file  . Food insecurity - inability: Not on file  . Transportation needs - medical: Not on file  . Transportation needs - non-medical: Not on file  Occupational History  . Occupation: disabled    Comment: seizure disorder with memory impairment  Tobacco Use  . Smoking status: Never Smoker  . Smokeless tobacco: Never Used  Substance and Sexual Activity  . Alcohol use: No  . Drug use: No  . Sexual activity: Yes    Birth control/protection: Surgical    Comment: 1 partner in last 12 months  Other Topics Concern  . Not on file  Social History Narrative   Marital status: married x 32 years;       Lives:   lives at home with his family.      Children:  two children; no grandchildren.  Son is special needs at age 50yo;  Cornelian Delaine with TOF.       Employment: unemployed; [employer] long term disability      Tobacco: none      Alcohol: none      Drugs: none      Exercise: unable to exercise due to  frequent falls   Patient has a college education.   Patient is right-handed.   Patient drinks one cup of soda daily.   Family History  Problem Relation Age of Onset  . Colon polyps Father   . Stroke Father   . Hyperlipidemia Father   . Hypertension Father   . Heart disease Mother        CABG at age 75  . Hyperlipidemia Mother   . Hypertension Mother   . Stroke Paternal Grandmother   . Stroke Paternal Grandfather   . Other Child        tetrology of fallot (cornealia deland syndrome)  . Colon cancer Neg Hx        Objective:    BP (!) 116/58   Pulse 73   Temp 98 F (36.7 C) (Oral)   Resp 18   Ht 5' 11"  (1.803 m)   Wt 203 lb 6.4 oz (92.3 kg)   SpO2 96%   BMI 28.37 kg/m  Physical Exam  Constitutional: He is oriented to person, place, and time. He appears well-developed and well-nourished. No distress.  HENT:  Head: Normocephalic and atraumatic.  Right Ear: External ear normal.  Left Ear: External ear normal.  Nose: Nose normal.  Mouth/Throat: Oropharynx is clear and moist.  Eyes: Conjunctivae and EOM are normal. Pupils are equal, round, and reactive to light.  Neck: Normal range of motion. Neck supple. Carotid bruit is not present. No thyromegaly present.  Cardiovascular: Normal rate, regular rhythm, normal heart sounds and intact distal pulses. Exam reveals no gallop and no friction rub.  No murmur heard. Pulmonary/Chest: Effort normal and breath sounds normal. He has no wheezes. He has no rales.  Abdominal: Soft. Bowel sounds are normal. He exhibits no distension and no mass. There is no tenderness. There is no rebound and no guarding.  Lymphadenopathy:    He has no cervical adenopathy.  Neurological: He is alert and oriented to person, place, and time. No cranial nerve  deficit. He exhibits normal muscle tone.  Skin: Skin is warm and dry. No rash noted. He is not diaphoretic.  Psychiatric: He has a normal mood and affect. His behavior is normal.  Nursing note and vitals reviewed.  No results found. Depression screen [facility] 2/9 07/26/2017 06/04/2017 05/28/2017 05/23/2017 02/11/2017  Decreased Interest 1 0 0 3 1  Down, Depressed, Hopeless 3 0 0 3 1  PHQ - 2 Score 4 0 0 6 2  Altered sleeping 2 - - - 0  Tired, decreased energy 0 - 0 1 1  Change in appetite 2 - 0 1 1  Feeling bad or failure about yourself  3 - 0 0 1  Trouble concentrating 3 - 0 0 1  Moving slowly or fidgety/restless 3 - 0 0 0  Suicidal thoughts 2 - 0 0 0  PHQ-9 Score 19 - - - 6  Difficult doing work/chores Somewhat difficult - - - -  Some recent data might be hidden   Fall Risk  07/26/2017 06/04/2017 05/28/2017 05/23/2017 02/11/2017  Falls in the past year? Yes Yes Yes Yes Yes  Number falls in past yr: 2 or more 2 or more 2 or more 2 or more 2 or more  Injury with Fall? No - Yes Yes No  Comment - - - - -  Risk Factor Category  - - High Fall Risk High Fall Risk High Fall Risk  Risk for fall due to : - - Impaired balance/gait;Medication side effect - -  Assessment & Plan:   1. Dizziness   2. Essential hypertension   3. Crohn's disease of both small and large intestine without complication ([place])   4. Current moderate episode of major depressive disorder without prior episode ([place])   5. Sensorineural hearing loss (SNHL) of both ears   6. Frequent falls   7. Partial symptomatic epilepsy with complex partial seizures, not intractable, without status epilepticus ([place])   8. Memory loss   9. Nonepileptic episode ([place])   10. TRANSAMINASES, SERUM, ELEVATED     Dizziness continues to be contributing to frequent falls.  Status post ENT consultation by Dr. Erik Obey.  No apparent vertiginous etiology dizziness.  Patient is status post vestibular rehab in the past.  S/p cardiology consultation in  08/2016 for dizziness; s/p event monitor that was normal.  Metoprolol was discontinued.  Depression and anxiety are worsening with upcoming disability trial in 3 months.  Followed closely by psychiatry with recent increase in trazodone dose to 150 mg.  Wife expresses concern that trazodone may be contributing to dizziness which is a possibility; however, patient has been suffering with dizziness for months.  Recommend discussing trazodone dosage further with psychiatry.  Continue intensive outpatient psychotherapy.  Followed by Dr. Krista Blue of neurology for complex partial seizures as well as nonepileptic episodes.  Upcoming appointment in April 2019.  Crohn's disease followed by GI.  Patient with elevated liver function studies and low platelets she is a chronic finding.  Patient to discuss recent labs with GI at upcoming appointment.  Colonoscopy to be deferred due to recent staph infection.  Ct imaging of liver indicates cirrhosis without biliary pathology; portal vein dilatation with splenomegaly indicative of portal venous hypertension felt secondary to NASH.   Staph bacteremia resolved; s/p repeat blood cultures x 2 negative from 07/23/17 per ID/[place].  Source most likely skin.    Orders Placed This Encounter  Procedures  . CBC with Differential/Platelet  . Comprehensive metabolic panel   No orders of the defined types were placed in this encounter.   Return in about 3 months (around 10/23/2017) for follow-up chronic medical conditions.   Vani Gunner Elayne Guerin, M.D. Primary Care at [facility] [address] [place], [place]  [address] [phone] phone [phone] fax

## 2017-07-26 NOTE — Patient Instructions (Signed)
     IF you received an x-ray today, you will receive an invoice from [facility]. Please contact [facility] at [phone] with questions or concerns regarding your invoice.   IF you received labwork today, you will receive an invoice from [facility]. Please contact [facility] at [phone] with questions or concerns regarding your invoice.   Our billing staff will not be able to assist you with questions regarding bills from these companies.  You will be contacted with the lab results as soon as they are available. The fastest way to get your results is to activate your My Chart account. Instructions are located on the last page of this paperwork. If you have not heard from us regarding the results in 2 weeks, please contact this office.     

## 2017-07-27 LAB — COMPREHENSIVE METABOLIC PANEL
ALT: 67 IU/L — AB (ref 0–44)
AST: 100 IU/L — AB (ref 0–40)
Albumin/Globulin Ratio: 1.5 (ref 1.2–2.2)
Albumin: 4 g/dL (ref 3.5–5.5)
Alkaline Phosphatase: 176 IU/L — ABNORMAL HIGH (ref 39–117)
BILIRUBIN TOTAL: 2.7 mg/dL — AB (ref 0.0–1.2)
BUN/Creatinine Ratio: 9 (ref 9–20)
BUN: 8 mg/dL (ref 6–24)
CALCIUM: 8.8 mg/dL (ref 8.7–10.2)
CHLORIDE: 106 mmol/L (ref 96–106)
CO2: 21 mmol/L (ref 20–29)
Creatinine, Ser: 0.9 mg/dL (ref 0.76–1.27)
GFR calc Af Amer: 111 mL/min/{1.73_m2} (ref >59)
GFR calc non Af Amer: 96 mL/min/{1.73_m2} (ref >59)
GLUCOSE: 91 mg/dL (ref 65–99)
Globulin, Total: 2.6 g/dL (ref 1.5–4.5)
POTASSIUM: 4 mmol/L (ref 3.5–5.2)
Sodium: 144 mmol/L (ref 134–144)
Total Protein: 6.6 g/dL (ref 6.0–8.5)

## 2017-07-27 LAB — CBC WITH DIFFERENTIAL/PLATELET
BASOS ABS: 0 10*3/uL (ref 0.0–0.2)
Basos: 1 %
EOS (ABSOLUTE): 0.1 10*3/uL (ref 0.0–0.4)
Eos: 4 %
Hematocrit: 32.2 % — ABNORMAL LOW (ref 37.5–51.0)
Hemoglobin: 11.5 g/dL — ABNORMAL LOW (ref 13.0–17.7)
IMMATURE GRANS (ABS): 0 10*3/uL (ref 0.0–0.1)
Immature Granulocytes: 1 %
LYMPHS: 18 %
Lymphocytes Absolute: 0.5 10*3/uL — ABNORMAL LOW (ref 0.7–3.1)
MCH: 37.3 pg — AB (ref 26.6–33.0)
MCHC: 35.7 g/dL (ref 31.5–35.7)
MCV: 105 fL — ABNORMAL HIGH (ref 79–97)
Monocytes Absolute: 0.2 10*3/uL (ref 0.1–0.9)
Monocytes: 7 %
NEUTROS ABS: 2.1 10*3/uL (ref 1.4–7.0)
NEUTROS PCT: 69 %
PLATELETS: 86 10*3/uL — AB (ref 150–379)
RBC: 3.08 x10E6/uL — AB (ref 4.14–5.80)
RDW: 16.1 % — AB (ref 12.3–15.4)
WBC: 2.9 10*3/uL — AB (ref 3.4–10.8)

## 2017-07-29 ENCOUNTER — Encounter (HOSPITAL_COMMUNITY): Payer: Self-pay | Admitting: Emergency Medicine

## 2017-07-29 ENCOUNTER — Emergency Department (HOSPITAL_COMMUNITY): Payer: 59

## 2017-07-29 ENCOUNTER — Emergency Department (HOSPITAL_COMMUNITY)
Admission: EM | Admit: 2017-07-29 | Discharge: 2017-07-29 | Disposition: A | Discharge status: Home / Self Care | Payer: 59 | Attending: Emergency Medicine | Admitting: Emergency Medicine

## 2017-07-29 DIAGNOSIS — E785 Hyperlipidemia, unspecified: Secondary | ICD-10-CM | POA: Diagnosis not present

## 2017-07-29 DIAGNOSIS — S60511A Abrasion of right hand, initial encounter: Secondary | ICD-10-CM | POA: Diagnosis not present

## 2017-07-29 DIAGNOSIS — Z79899 Other long term (current) drug therapy: Secondary | ICD-10-CM | POA: Insufficient documentation

## 2017-07-29 DIAGNOSIS — S42001A Fracture of unspecified part of right clavicle, initial encounter for closed fracture: Secondary | ICD-10-CM | POA: Diagnosis not present

## 2017-07-29 DIAGNOSIS — Y939 Activity, unspecified: Secondary | ICD-10-CM | POA: Insufficient documentation

## 2017-07-29 DIAGNOSIS — S4991XA Unspecified injury of right shoulder and upper arm, initial encounter: Secondary | ICD-10-CM | POA: Diagnosis present

## 2017-07-29 DIAGNOSIS — I10 Essential (primary) hypertension: Secondary | ICD-10-CM | POA: Diagnosis not present

## 2017-07-29 DIAGNOSIS — Z7982 Long term (current) use of aspirin: Secondary | ICD-10-CM | POA: Diagnosis not present

## 2017-07-29 DIAGNOSIS — S42031A Displaced fracture of lateral end of right clavicle, initial encounter for closed fracture: Secondary | ICD-10-CM | POA: Diagnosis not present

## 2017-07-29 DIAGNOSIS — S0081XA Abrasion of other part of head, initial encounter: Secondary | ICD-10-CM | POA: Diagnosis not present

## 2017-07-29 DIAGNOSIS — S2231XA Fracture of one rib, right side, initial encounter for closed fracture: Secondary | ICD-10-CM | POA: Insufficient documentation

## 2017-07-29 DIAGNOSIS — Y929 Unspecified place or not applicable: Secondary | ICD-10-CM | POA: Insufficient documentation

## 2017-07-29 DIAGNOSIS — Y998 Other external cause status: Secondary | ICD-10-CM | POA: Insufficient documentation

## 2017-07-29 DIAGNOSIS — W010XXA Fall on same level from slipping, tripping and stumbling without subsequent striking against object, initial encounter: Secondary | ICD-10-CM | POA: Insufficient documentation

## 2017-07-29 DIAGNOSIS — E119 Type 2 diabetes mellitus without complications: Secondary | ICD-10-CM | POA: Insufficient documentation

## 2017-07-29 DIAGNOSIS — M25511 Pain in right shoulder: Secondary | ICD-10-CM | POA: Diagnosis not present

## 2017-07-29 MED ORDER — HYDROCODONE-ACETAMINOPHEN 5-325 MG PO TABS: 1.0000 | ORAL_TABLET | Freq: Four times a day (QID) | ORAL | 0 refills | Status: DC | PRN

## 2017-07-29 MED ORDER — HYDROCODONE-ACETAMINOPHEN 5-325 MG PO TABS
2.0000 | ORAL_TABLET | Freq: Once | ORAL | Status: AC
  Administered 2017-07-29: 2 via ORAL
  Filled 2017-07-29: qty 2

## 2017-07-29 NOTE — Discharge Instructions (Signed)
Wear the sling as directed.  [facility] tomorrow to schedule an office visit within the next 1 or 2 weeks.  Take Tylenol for mild pain or the pain medicine prescribed for bad pain.  Do not take Tylenol together with the pain medicine prescribed as the combination can be dangerous to your liver.  Wash the abrasions on your face and right hand daily with soap and water and place a thin layer of bacitracin ointment over the wounds.  Signs of infection include redness around the wound, swelling, drainage from the wounds or fever.  If you think he may be developing an infection see an urgent care center or your primary care physician.  Return if your condition worsens for any reason

## 2017-07-29 NOTE — ED Notes (Signed)
Bed: WTR5 Expected date:  Expected time:  Means of arrival:  Comments: Eyvonne Left dont be so mean

## 2017-07-29 NOTE — ED Triage Notes (Signed)
Patient reports walking his puppy and was tripped. Patient c/o right shoulder pain and unable to move. Pt has abrasion to right palm.

## 2017-07-29 NOTE — ED Notes (Signed)
Pt wife en route to pick patient up.

## 2017-07-29 NOTE — ED Provider Notes (Signed)
[facility] DEPT Provider Note   CSN: [id number] Arrival date & time: 07/29/17  1409     History   Chief Complaint Chief Complaint  Patient presents with  . Shoulder Injury    HPI [name] [name] is a 57 y.o. male.  HPI Patient was walking his dog 2:30 PM today when the dog ran ahead of him.  The dog pulled him down to the ground as the patient was holding the dog's leash.  He complains of pain at right shoulder and right lateral chest since the fall.  He also suffered abrasions at right infraorbital area and palmar surface of right hand as result of the event.  No other injury.  No treatment prior to coming here.  Pain is worse when he takes a deep breath or moves his right shoulder.  Not improved by anything.  He was feeling well prior to the event today Past Medical History:  Diagnosis Date  . Adenomatous colon polyp 10/1999  . Anxiety   . Arthritis    neck, yoga helps.  . Cancer ([place])    skin  . Cataract   . Complex partial seizure ([place])   . Crohn's disease of small and large intestines ([place]) 1999  . Depression   . Diabetes mellitus   . Esophageal stricture   . External hemorrhoids   . GERD (gastroesophageal reflux disease)   . History of kidney stones    3 large stones still present  . History of nuclear stress test 11/15/2010   exercise myoview; normal pattern of perfusion; normal, low risk study   . Hyperlipemia   . Hypertension   . Migraines   . MRSA (methicillin resistant Staphylococcus aureus) 10/2010   . Osteoporosis   . Pneumonia   . PONV (postoperative nausea and vomiting)   . PTSD (post-traumatic stress disorder)   . Renal cyst, acquired, left 07/08/2017  . Renal lesion 05/28/2017  . Seizures ([place])   . Skin cancer    squamous cell multiple; [facility]; followed every 3 months.  . Sleep apnea    CPAP machine, uses nightly  . Staphylococcus aureus bacteremia with sepsis ([place]) 05/28/2017    Patient Active Problem List   Diagnosis Date Noted  . Renal cyst, acquired, left 07/08/2017  . Frequent falls 07/08/2017  . Staphylococcus aureus bacteremia with sepsis ([name]) 05/28/2017  . Renal lesion 05/28/2017  . Bacteremia due to Staphylococcus aureus 05/06/2017  . MSSA bacteremia 05/06/2017  . Confusion 04/02/2017  . Syncope 08/30/2016  . Depression 04/30/2016  . Memory loss 04/30/2016  . OSA on CPAP 10/31/2015  . Nonepileptic episode ([place]) 04/25/2015  . Seizure ([place]) 04/04/2013  . Abnormal EKG 03/12/2013  . DOE (dyspnea on exertion) 03/12/2013  . Complex partial seizure ([place]) 04/19/2012  . DM2 (diabetes mellitus, type 2) ([place]) 09/25/2011  . Altered mental status 09/25/2011  . OTHER DYSPHAGIA 03/27/2009  . TRANSAMINASES, SERUM, ELEVATED 03/27/2009  . Cough 11/10/2008  . GERD 12/07/2007  . COLONIC POLYPS, ADENOMATOUS, HX OF 12/07/2007  . EXTERNAL HEMORRHOIDS 09/11/2007  . CROHN'S DISEASE, LARGE AND SMALL INTESTINES 09/11/2007  . OSTEOPOROSIS 09/11/2007  . HYPERLIPIDEMIA NEC/NOS 02/27/2007  . Essential hypertension 02/27/2007    Past Surgical History:  Procedure Laterality Date  . CATARACT EXTRACTION Bilateral   . ELBOW SURGERY  2012   elbow MRSA infection   . EYE SURGERY     cataracts removed, /w IOL  . INSERTION OF MESH N/A 07/01/2016   Procedure: INSERTION OF MESH;  Surgeon: Sherren Mocha  Zella Richer, MD;  Location: [facility];  Service: General;  Laterality: N/A;  . SHOULDER SURGERY    . SINUS SURGERY WITH INSTATRAK    . TEE WITHOUT CARDIOVERSION N/A 05/09/2017   Procedure: TRANSESOPHAGEAL ECHOCARDIOGRAM (TEE);  Surgeon: [name], MD;  Location: [facility] ENDOSCOPY;  Service: Cardiovascular;  Laterality: N/A;  . TRANSTHORACIC ECHOCARDIOGRAM  02/22/2011   EF 55-65%; increased pattern of LVH with mild conc hypertrophy, abnormal relaxation & increased filling pressure (grade 2 diastolic dysfunction); atrial septum thickened (lipomatous hypertrophy)  . UMBILICAL HERNIA REPAIR N/A 07/01/2016   Procedure: UMBILICAL  HERNIA REPAIR WITH MESH;  Surgeon: [name], MD;  Location: [facility];  Service: General;  Laterality: N/A;  . VASECTOMY         Home Medications    Prior to Admission medications   Medication Sig Start Date End Date Taking? Authorizing Provider  Ascorbic Acid (VITAMIN C PO) Take 1 tablet at bedtime by mouth.    [provider]  aspirin EC 81 MG tablet Take 81 mg at bedtime by mouth.     [provider]  Calcium Carb-Cholecalciferol (CALCIUM-VITAMIN D) 500-200 MG-UNIT tablet Take 1 tablet 2 (two) times daily by mouth.    [provider]  cholecalciferol (VITAMIN D) 1000 units tablet Take 1,000 Units at bedtime by mouth.     [provider]  diazepam (VALIUM) 10 MG tablet TK 1 T PO 1 HOUR PRIOR TO PROCEDURE. REPEAT PRN 05/27/17   [provider]  esomeprazole (NEXIUM) 40 MG capsule TAKE 1 CAPSULE TWICE A DAY 07/04/17   Ladene Artist, MD  Eszopiclone 3 MG TABS Take 1 tablet (3 mg total) by mouth at bedtime. Take immediately before bedtime 03/21/17   Wardell Honour, MD  ferrous sulfate 325 (65 FE) MG tablet Take 325 mg at bedtime by mouth.     [provider]  folic acid (FOLVITE) 1 MG tablet Take 2 tablets (2 mg total) by mouth daily. 11/19/16   Wardell Honour, MD  glucose blood (CHOICE DM FORA G20 TEST STRIPS) test strip Use as instructed 11/13/12   [name], MD  HYDROcodone-acetaminophen (NORCO/VICODIN) 5-325 MG tablet Take 1 tablet by mouth 2 (two) times daily as needed. for pain 06/10/17   [provider]  Ibuprofen-Diphenhydramine Cit (ADVIL PM PO) Take 1 tablet at bedtime by mouth.     [provider]  Lactase (DAIRY-RELIEF PO) Take 2 tablets by mouth 2 (two) times daily.     [provider]  lamoTRIgine (LAMICTAL) 100 MG tablet Take 1 tablet (100 mg total) by mouth 2 (two) times daily. 04/25/17   Marcial Pacas, MD  loratadine (CLARITIN) 10 MG tablet Take 10 mg at bedtime by mouth.     [provider]  mercaptopurine (PURINETHOL) 50 MG tablet FOR DIRECTIONS ON HOW TO   TAKE THIS MEDICINE, READ   THE ENCLOSED MEDICATION    INFORMATION FORM 07/18/17   Ladene Artist, MD  mesalamine (LIALDA) 1.2 g EC tablet Take 2 tablets (2.4 g total) by mouth daily. Patient taking differently: Take 1.2 g 2 (two) times daily by mouth.  02/18/17   Ladene Artist, MD  methscopolamine (PAMINE FORTE) 5 MG tablet Take 1 tablet (5 mg total) by mouth 2 (two) times daily. 07/21/17   Ladene Artist, MD  naproxen (NAPROSYN) 500 MG tablet TK 1 T PO BID 05/14/17   [provider]  ONE TOUCH ULTRA TEST test strip USE AS INSTRUCTED  Weber, Sarah L, PA-C  potassium chloride SA (K-DUR,KLOR-CON) 20 MEQ tablet Take 1 tablet (20 mEq total) by mouth 2 (two) times daily. 08/20/16   Wardell Honour, MD  Probiotic Product (PROBIOTIC PO) Take 1 tablet at bedtime by mouth.     [provider]  sertraline (ZOLOFT) 100 MG tablet Take 200 mg daily with breakfast by mouth.  03/16/17   [provider]  traZODone (DESYREL) 100 MG tablet Take 50-100 mg at bedtime by mouth.  04/01/17   [provider]    Family History Family History  Problem Relation Age of Onset  . Colon polyps Father   . Stroke Father   . Hyperlipidemia Father   . Hypertension Father   . Heart disease Mother        CABG at age 9  . Hyperlipidemia Mother   . Hypertension Mother   . Stroke Paternal Grandmother   . Stroke Paternal Grandfather   . Other Child        tetrology of fallot (cornealia deland syndrome)  . Colon cancer Neg Hx     Social History Social History   Tobacco Use  . Smoking status: Never Smoker  . Smokeless tobacco: Never Used  Substance Use Topics  . Alcohol use: No  . Drug use: No     Allergies   Azithromycin; Beef-derived products; Milk-related compounds; Penicillins; Sulfonamide derivatives; and Dilaudid [hydromorphone hcl]   Review of Systems Review of Systems    Constitutional: Negative.   HENT: Negative.   Respiratory: Negative.   Cardiovascular: Positive for chest pain.       Right lateral rib pain  Gastrointestinal: Negative.   Musculoskeletal: Positive for arthralgias.  Skin: Positive for wound.       Abrasions  Allergic/Immunologic:       Current on tetanus immunization  Neurological: Negative.   Psychiatric/Behavioral: Negative.   All other systems reviewed and are negative.    Physical Exam Updated Vital Signs BP 116/60 (BP Location: Left Arm)   Pulse 71   Temp 98.7 F (37.1 C) (Oral)   Resp 18   Ht 5' 11"  (1.803 m)   Wt 90.7 kg (200 lb)   SpO2 97%   BMI 27.89 kg/m   Physical Exam  Constitutional: He appears well-developed and well-nourished. No distress.  HENT:  Abrasion at right infraorbital area.  No soft tissue swelling.  Otherwise normocephalic atraumatic  Eyes: Conjunctivae and EOM are normal. Pupils are equal, round, and reactive to light.  Neck: Neck supple. No tracheal deviation present. No thyromegaly present.  Cardiovascular: Normal rate and regular rhythm.  No murmur heard. Pulmonary/Chest: Effort normal and breath sounds normal. He exhibits tenderness.  Tender at right lateral rib cage.  No crepitance or flail  Abdominal: Soft. Bowel sounds are normal. He exhibits no distension. There is no tenderness.  Musculoskeletal: Normal range of motion. He exhibits no edema or tenderness.  Upper extremity tiny abrasion palmar surface hand otherwise skin intact.  Tender overlying clavicle with deformity.  Limited range of motion of shoulder secondary to pain.  Radial pulse 2+.  Good capillary refill.  All other extremities without contusion abrasion or tenderness neurovascularly intact  Neurological: He is alert. Coordination normal.  Skin: Skin is warm and dry. No rash noted.  Psychiatric: He has a normal mood and affect.  Nursing note and vitals reviewed.    ED Treatments / Results  Labs (all labs ordered are  listed, but only abnormal results are displayed) Labs Reviewed -  No data to display  EKG  EKG Interpretation None      X-rays viewed by me Radiology Dg Ribs Unilateral W/chest Right  Result Date: 07/29/2017 CLINICAL DATA:  Right rib pain after fall. EXAM: RIGHT RIBS AND CHEST - 3+ VIEW COMPARISON:  Radiographs of June 04, 2017. FINDINGS: Stable cardiomediastinal silhouette. No pneumothorax or pleural effusion is noted. No acute pulmonary disease is noted. Probable mildly displaced fracture is seen involving right seventh rib. Mild right basilar subsegmental atelectasis is noted. IMPRESSION: Mildly displaced right seventh rib fracture. No pneumothorax or pleural effusion is noted. Electronically Signed   By: Marijo Conception, M.D.   On: 07/29/2017 16:27   Dg Shoulder Right  Result Date: 07/29/2017 CLINICAL DATA:  Right shoulder pain after fall. EXAM: RIGHT SHOULDER - 2+ VIEW COMPARISON:  None. FINDINGS: Moderately displaced distal right clavicular fracture is noted. Glenohumeral joint appears normal. IMPRESSION: Moderately displaced distal right clavicular fracture. Electronically Signed   By: Marijo Conception, M.D.   On: 07/29/2017 16:22   Dg Humerus Right  Result Date: 07/29/2017 CLINICAL DATA:  Right arm pain after fall. EXAM: RIGHT HUMERUS - 2+ VIEW COMPARISON:  None. FINDINGS: Moderately displaced fracture is seen involving distal right clavicle. Right humerus appears normal. No soft tissue abnormality is noted. IMPRESSION: Moderately displaced distal right clavicular fracture. Normal right humerus. Electronically Signed   By: Marijo Conception, M.D.   On: 07/29/2017 16:20    Procedures Procedures (including critical care time)  Medications Ordered in ED Medications  HYDROcodone-acetaminophen (NORCO/VICODIN) 5-325 MG per tablet 2 tablet (not administered)     Initial Impression / Assessment and Plan / ED Course  I have reviewed the triage vital signs and the nursing  notes.  Pertinent labs & imaging results that were available during my care of the patient were reviewed by me and considered in my medical decision making (see chart for details).     Patient is seen [facility] orthopedics in the past.  He is urged to call them tomorrow.  He will get a sling prior to discharge.  Norco 2 tablets p.o. prior to discharge and prescription for [facility] Controlled Substance reporting System queried  Final Clinical Impressions(s) / ED Diagnoses  Diagnoses #1 fall #2 closed fracture of right clavicle #3 right seventh rib fracture #4 abrasions to face and right hand Final diagnoses:  None    ED Discharge Orders    None       Orlie Dakin, MD 07/29/17 [phone]

## 2017-07-30 DIAGNOSIS — M67911 Unspecified disorder of synovium and tendon, right shoulder: Secondary | ICD-10-CM | POA: Diagnosis not present

## 2017-07-30 DIAGNOSIS — S42024A Nondisplaced fracture of shaft of right clavicle, initial encounter for closed fracture: Secondary | ICD-10-CM | POA: Diagnosis not present

## 2017-08-06 DIAGNOSIS — M67911 Unspecified disorder of synovium and tendon, right shoulder: Secondary | ICD-10-CM | POA: Diagnosis not present

## 2017-08-07 ENCOUNTER — Encounter: Payer: Self-pay | Admitting: Family Medicine

## 2017-08-12 ENCOUNTER — Encounter: Payer: Self-pay | Admitting: Family Medicine

## 2017-08-12 DIAGNOSIS — G4733 Obstructive sleep apnea (adult) (pediatric): Secondary | ICD-10-CM | POA: Diagnosis not present

## 2017-08-20 DIAGNOSIS — M67911 Unspecified disorder of synovium and tendon, right shoulder: Secondary | ICD-10-CM | POA: Diagnosis not present

## 2017-08-27 ENCOUNTER — Encounter: Payer: Self-pay | Admitting: Gastroenterology

## 2017-08-27 ENCOUNTER — Ambulatory Visit (INDEPENDENT_AMBULATORY_CARE_PROVIDER_SITE_OTHER): Payer: 59 | Admitting: Gastroenterology

## 2017-08-27 ENCOUNTER — Other Ambulatory Visit: Payer: Self-pay

## 2017-08-27 ENCOUNTER — Other Ambulatory Visit (INDEPENDENT_AMBULATORY_CARE_PROVIDER_SITE_OTHER): Payer: 59

## 2017-08-27 VITALS — BP 112/68 | HR 64 | Ht 71.0 in | Wt 200.5 lb

## 2017-08-27 DIAGNOSIS — K7460 Unspecified cirrhosis of liver: Secondary | ICD-10-CM

## 2017-08-27 DIAGNOSIS — K50819 Crohn's disease of both small and large intestine with unspecified complications: Secondary | ICD-10-CM | POA: Diagnosis not present

## 2017-08-27 DIAGNOSIS — K921 Melena: Secondary | ICD-10-CM | POA: Diagnosis not present

## 2017-08-27 DIAGNOSIS — E876 Hypokalemia: Secondary | ICD-10-CM | POA: Diagnosis not present

## 2017-08-27 LAB — COMPREHENSIVE METABOLIC PANEL
ALBUMIN: 4.2 g/dL (ref 3.5–5.2)
ALT: 27 U/L (ref 0–53)
AST: 97 U/L — ABNORMAL HIGH (ref 0–37)
Alkaline Phosphatase: 210 U/L — ABNORMAL HIGH (ref 39–117)
BUN: 15 mg/dL (ref 6–23)
CHLORIDE: 108 meq/L (ref 96–112)
CO2: 24 mEq/L (ref 19–32)
Calcium: 9.7 mg/dL (ref 8.4–10.5)
Creatinine, Ser: 1.2 mg/dL (ref 0.40–1.50)
GFR: 66.63 mL/min (ref >60.00)
Glucose, Bld: 95 mg/dL (ref 70–99)
POTASSIUM: 3.8 meq/L (ref 3.5–5.1)
SODIUM: 143 meq/L (ref 135–145)
Total Bilirubin: 3.2 mg/dL — ABNORMAL HIGH (ref 0.2–1.2)
Total Protein: 6.9 g/dL (ref 6.0–8.3)

## 2017-08-27 LAB — CBC WITH DIFFERENTIAL/PLATELET
BASOS PCT: 1.2 % (ref 0.0–3.0)
Basophils Absolute: 0.1 10*3/uL (ref 0.0–0.1)
Eosinophils Absolute: 0.2 10*3/uL (ref 0.0–0.7)
Eosinophils Relative: 4 % (ref 0.0–5.0)
HCT: 35 % — ABNORMAL LOW (ref 39.0–52.0)
Hemoglobin: 12.5 g/dL — ABNORMAL LOW (ref 13.0–17.0)
LYMPHS ABS: 0.7 10*3/uL (ref 0.7–4.0)
Lymphocytes Relative: 12.3 % (ref 12.0–46.0)
MCHC: 35.5 g/dL (ref 30.0–36.0)
MCV: 109 fl — ABNORMAL HIGH (ref 78.0–100.0)
MONO ABS: 0.5 10*3/uL (ref 0.1–1.0)
Monocytes Relative: 8.3 % (ref 3.0–12.0)
NEUTROS ABS: 4.3 10*3/uL (ref 1.4–7.7)
Neutrophils Relative %: 74.2 % (ref 43.0–77.0)
Platelets: 132 10*3/uL — ABNORMAL LOW (ref 150.0–400.0)
RBC: 3.21 Mil/uL — ABNORMAL LOW (ref 4.22–5.81)
RDW: 17.1 % — AB (ref 11.5–15.5)
WBC: 5.8 10*3/uL (ref 4.0–10.5)

## 2017-08-27 LAB — AMMONIA: Ammonia: 81 umol/L — ABNORMAL HIGH (ref 11–35)

## 2017-08-27 LAB — TSH: TSH: 2.57 u[IU]/mL (ref 0.35–4.50)

## 2017-08-27 LAB — LIPASE: Lipase: 6 U/L — ABNORMAL LOW (ref 11.0–59.0)

## 2017-08-27 MED ORDER — MERCAPTOPURINE 50 MG PO TABS: ORAL_TABLET | ORAL | 1 refills | Status: DC

## 2017-08-27 MED ORDER — MESALAMINE 1.2 G PO TBEC: 2.4000 g | DELAYED_RELEASE_TABLET | Freq: Every day | ORAL | 1 refills | Status: DC

## 2017-08-27 MED ORDER — RIFAXIMIN 550 MG PO TABS: 550.0000 mg | ORAL_TABLET | Freq: Two times a day (BID) | ORAL | 3 refills | Status: DC

## 2017-08-27 MED ORDER — POTASSIUM CHLORIDE CRYS ER 20 MEQ PO TBCR: 20.0000 meq | EXTENDED_RELEASE_TABLET | Freq: Two times a day (BID) | ORAL | 0 refills | Status: DC

## 2017-08-27 NOTE — Progress Notes (Signed)
    History of Present Illness: This is a 57 year old male returning for follow-up of Crohn's disease and cirrhosis.  He has persistent problems with diarrhea occurring about 3-4 times per day and this bowel pattern has not changed.  He continues to note small amounts of bright red blood per rectum intermittently with bowel movements.  He had similar symptoms at his last visit in August and he was recommended to undergo colonoscopy however this was not completed.  He has been having difficulties with dizziness and falls which have been evaluated by his PCP.  His ambulation has been slow and more difficult.  Suffered right rib and right clavicle fractures after a fall in early February.  Current Medications, Allergies, Past Medical History, Past Surgical History, Family History and Social History were reviewed in [employer] record.  Physical Exam: General: Well developed, well nourished, no acute distress Head: Normocephalic and atraumatic Eyes:  sclerae anicteric, EOMI Ears: Normal auditory acuity Mouth: No deformity or lesions Lungs: Clear throughout to auscultation Heart: Regular rate and rhythm; no murmurs, rubs or bruits He has abdomen: Soft, non tender and non distended. No masses, hepatosplenomegaly or hernias noted. Normal Bowel sounds Musculoskeletal: Symmetrical with no gross deformities  Pulses:  Normal pulses noted Extremities: No clubbing, cyanosis, edema or deformities noted Neurological: Alert oriented x 4, slow mentation, unsteady gait, using a cane. Psychological:  Alert and cooperative. Normal mood and affect  Assessment and Recommendations:  1. Crohn's ileocolitis.  Stable.  6MP 100 mg po qam. Lialda 2.4 g daily.    2. Cirrhosis. R/O HE. Serologic workup was negative.  Etiology is presumed to be NASH  CBC, CMP, lipase, TSH, ammonia today.  3. Frequent small volume hematochezia likely secondary to hemorrhoids.. Colonoscopy is indicated for further  evaluation however due to his dizziness, unsteady gait we will defer colonoscopy until his right clavicle and right rib fractures improve and his dizziness and unsteady gait further evaluated. REV in 3 months.   4.  Dizziness, unsteady gait.  Further evaluation per PCP and neurology.

## 2017-08-27 NOTE — Patient Instructions (Addendum)
Your physician has requested that you go to the basement for  lab work before leaving today.    We have sent the following prescriptions to your mail in pharmacy: Mercaptopurine,Lialda.   If you have not heard from your mail in pharmacy within 1 week or if you have not received your medication in the mail, please contact us at [phone] so we may find out why.  We sent your Potassium to you local pharmacy for a 30 day supply   If you are age 56 or older, your body mass index should be between 23-30. Your Body mass index is 27.96 kg/m. If this is out of the aforementioned range listed, please consider follow up with your Primary Care Provider.  If you are age 87 or younger, your body mass index should be between 19-25. Your Body mass index is 27.96 kg/m. If this is out of the aformentioned range listed, please consider follow up with your Primary Care Provider.

## 2017-08-27 NOTE — Progress Notes (Signed)
xifaxan

## 2017-08-28 ENCOUNTER — Encounter: Payer: Self-pay | Admitting: Family Medicine

## 2017-09-05 ENCOUNTER — Telehealth: Payer: Self-pay | Admitting: Gastroenterology

## 2017-09-05 ENCOUNTER — Encounter: Payer: Self-pay | Admitting: Family Medicine

## 2017-09-05 DIAGNOSIS — K7460 Unspecified cirrhosis of liver: Secondary | ICD-10-CM

## 2017-09-05 NOTE — Telephone Encounter (Signed)
Patient's wife reports that the patient woke up this am very disoriented and unsteady.  He was started on Xifaxan 550 mg BID  Last Thursday for an ammonia level of 81.  Wife reports that she left for work and patient went back to bed.  She is asking if he needs to come back and have repeat ammonia level checked? She does not know how he is currently.  She is advised that if he continues to be unsteady and confused he will need to go to the ED for evaluation.  This is new for him.  Patient was asymptomatic last week when the xifaxan was started.   Dr. Carlean Purl will you please review you are DOD.

## 2017-09-05 NOTE — Telephone Encounter (Signed)
[name] [name] stated that pt woke up disoriented and cold, she wonders if it is related to treatment that pt began last Thursday due to high ammonia levels. She would like a cb please.

## 2017-09-05 NOTE — Telephone Encounter (Signed)
Ammonia, CMP, CBC 3/18 or 3/19.  ED if he doesn't improve.

## 2017-09-05 NOTE — Telephone Encounter (Signed)
Wife notified She stated he was better after a longer nap.  She will take him to the ED if he has a repeat episode.   Dr. Fuller Plan they would like to come for a ammonia level next week.  Is this ok?

## 2017-09-05 NOTE — Telephone Encounter (Signed)
Patient's wife notified  She will bring him for labs early next week

## 2017-09-05 NOTE — Telephone Encounter (Signed)
She should check on him and if still disoriented he should be seen in ED

## 2017-09-08 ENCOUNTER — Other Ambulatory Visit (INDEPENDENT_AMBULATORY_CARE_PROVIDER_SITE_OTHER): Payer: 59

## 2017-09-08 DIAGNOSIS — K7460 Unspecified cirrhosis of liver: Secondary | ICD-10-CM | POA: Diagnosis not present

## 2017-09-08 LAB — COMPREHENSIVE METABOLIC PANEL
ALT: 23 U/L (ref 0–53)
AST: 33 U/L (ref 0–37)
Albumin: 4 g/dL (ref 3.5–5.2)
Alkaline Phosphatase: 160 U/L — ABNORMAL HIGH (ref 39–117)
BILIRUBIN TOTAL: 2.9 mg/dL — AB (ref 0.2–1.2)
BUN: 12 mg/dL (ref 6–23)
CHLORIDE: 109 meq/L (ref 96–112)
CO2: 26 meq/L (ref 19–32)
Calcium: 9.1 mg/dL (ref 8.4–10.5)
Creatinine, Ser: 0.88 mg/dL (ref 0.40–1.50)
GFR: 95.3 mL/min (ref >60.00)
GLUCOSE: 101 mg/dL — AB (ref 70–99)
Potassium: 3.9 mEq/L (ref 3.5–5.1)
Sodium: 143 mEq/L (ref 135–145)
Total Protein: 6.5 g/dL (ref 6.0–8.3)

## 2017-09-08 LAB — AMMONIA: Ammonia: 88 umol/L — ABNORMAL HIGH (ref 11–35)

## 2017-09-09 ENCOUNTER — Other Ambulatory Visit: Payer: Self-pay

## 2017-09-09 DIAGNOSIS — C44329 Squamous cell carcinoma of skin of other parts of face: Secondary | ICD-10-CM | POA: Diagnosis not present

## 2017-09-09 DIAGNOSIS — R7989 Other specified abnormal findings of blood chemistry: Secondary | ICD-10-CM

## 2017-09-09 DIAGNOSIS — C44529 Squamous cell carcinoma of skin of other part of trunk: Secondary | ICD-10-CM | POA: Diagnosis not present

## 2017-09-09 DIAGNOSIS — L821 Other seborrheic keratosis: Secondary | ICD-10-CM | POA: Diagnosis not present

## 2017-09-09 DIAGNOSIS — L570 Actinic keratosis: Secondary | ICD-10-CM | POA: Diagnosis not present

## 2017-09-09 DIAGNOSIS — D225 Melanocytic nevi of trunk: Secondary | ICD-10-CM | POA: Diagnosis not present

## 2017-09-09 DIAGNOSIS — Z85828 Personal history of other malignant neoplasm of skin: Secondary | ICD-10-CM | POA: Diagnosis not present

## 2017-09-15 ENCOUNTER — Other Ambulatory Visit (INDEPENDENT_AMBULATORY_CARE_PROVIDER_SITE_OTHER): Payer: 59

## 2017-09-15 ENCOUNTER — Other Ambulatory Visit: Payer: Self-pay

## 2017-09-15 DIAGNOSIS — K7460 Unspecified cirrhosis of liver: Secondary | ICD-10-CM

## 2017-09-15 DIAGNOSIS — R7989 Other specified abnormal findings of blood chemistry: Secondary | ICD-10-CM

## 2017-09-15 LAB — AMMONIA: Ammonia: 76 umol/L — ABNORMAL HIGH (ref 11–35)

## 2017-09-15 MED ORDER — LACTULOSE 20 GM/30ML PO SOLN: 30.0000 mL | Freq: Two times a day (BID) | ORAL | 3 refills | Status: DC

## 2017-09-17 DIAGNOSIS — M67911 Unspecified disorder of synovium and tendon, right shoulder: Secondary | ICD-10-CM | POA: Diagnosis not present

## 2017-09-29 ENCOUNTER — Telehealth: Payer: Self-pay | Admitting: Gastroenterology

## 2017-09-29 ENCOUNTER — Other Ambulatory Visit (INDEPENDENT_AMBULATORY_CARE_PROVIDER_SITE_OTHER): Payer: 59

## 2017-09-29 DIAGNOSIS — K7460 Unspecified cirrhosis of liver: Secondary | ICD-10-CM | POA: Diagnosis not present

## 2017-09-29 LAB — AMMONIA: AMMONIA: 101 umol/L — AB (ref 11–35)

## 2017-09-29 NOTE — Telephone Encounter (Signed)
See ammonia level for additional details.

## 2017-10-06 DIAGNOSIS — C44329 Squamous cell carcinoma of skin of other parts of face: Secondary | ICD-10-CM | POA: Diagnosis not present

## 2017-10-06 NOTE — Progress Notes (Signed)
[facility]  PATIENT: [name] [name] DOB: Jun 27, 1960   REASON FOR VISIT: Follow-up for seizure disorder with continued episodes of confusion.  History of obstructive sleep apnea CPAP machine is old and hardly works according to patient   HISTORY FROM: Patient and friend   HISTORY OF PRESENT ILLNESS:  [name] [name] is a 57 years old right-handed male, accompanied by his wife, was seen in refer by his primary care physician Dr. [name] for evaluation of seizure. He had a past medical history of diabetes, hypertension, Crohn's disease, on immunosuppressive treatment, He presented with seizure-like event, reported initial event was June 28 2014, he was found by his wife to difficulty to be awake and eyes closed, took a while for him to open his eyes, and become very confused, also angry, episodes last about 5-10 minutes, there was also witnessed leg extension, jerking movement, He had multiple recurrent confusion episodes later, MRI of the brain, MRA of the brain in April 2013 was normal, EEG showed intermittent left anterior and medial temporal lobe slowing He later was also diagnosed with severe obstructive sleep apnea, using CPAP machine UPDATE August 1 st 2016:[name] [name] He continue have recurrent episode while taking Vimpat 200 mg twice a day, and titrating dose of Keppra,  I have reviewed and summarized his video EEG monitoring from [facility] in June 17 to December 14 2014, patient reported different spells, 1, memory/sleepiness, preceded by headaches, often followed by right arm numbness, 2, staring and oral automatism, Video EEG monitoring was carried out during withdraw of the admission antiseizure medications, as well as sleep deprivation, for typical example of type I spells were recorded, with overall findings suggesting the diagnosis of psychogenic nonepileptic spells, is referred to psychotherapy, Keppra was tapered off,  Interictal EEG was normal, non-of the  type II spells were recorded, clinical impression is that spell type II is likely to be focal seizure, he was advised to keep Vimpat, decreased the dosage from 200 mg twice a day to 100 mg twice a day, He is also taking Lexapro 10 mg daily, which has helped his depression anxiety,  Update April 02 2017:[name] [name] Review neuropsychiatric evaluation by Dr. Bonita Quin in August 2018: Suboptimal level of effort, cause of poor effort is impossible to acertain,  but the pattern of his responding was statistically most consistent with patient asked to fake impairment. He reported high level of psychiatric distress, depression, frequent suicidal ideation, anxiety, significant preoccupation with perceived poor health, the conclusion was patient's cognitive deficit in daily life most likely secondary to primary psychiatric disorders, but not due to neurological disorders, he continue to meet the diagnostic criteria for conversion disorder, The suggestion was to continue psychiatric treatment, He complains of sudden onset of confusion, got lost while walking his dog, lasting for 30 minutes, there was no seizure-like activity, multiple episodes in one week, he is taking Vimpat 150 mg twice a day  UPDATE 4/16/2019CM Mr. [age] year old male returns for follow-up, with a history of seizure-like activity.  He claims 2-3 seizures a week with confusion dizziness he sometimes falls with his seizures.  He was admitted to the hospital for several days in November 2018 for a staph infection.  He also complains with headache and memory loss.  He reports a high level of stress, depression and he is now seeing a psychiatrist.  He meets criteria for conversion disorder.  He has obstructive sleep apnea and tells me that his CPAP machine is 56 years old and is  not working well.  He needs repeat sleep study to establish that he has obstructive sleep apnea.  He was switched from Vimpat to Lamictal 100 mg twice daily.  He returns for  reevaluation  REVIEW OF SYSTEMS: Full 14 system review of systems performed and notable only for those listed, all others are neg:  Constitutional: Fatigue Cardiovascular: neg Ear/Nose/Throat: neg  Skin: neg Eyes: neg Respiratory: neg Gastroitestinal: neg  Hematology/Lymphatic: neg  Endocrine: Intolerance to cold Musculoskeletal: Joint pain back pain Allergy/Immunology: neg Neurological: Memory loss headache seizure passing out Psychiatric: Depression anxiety confusion agitation.  See psych Sleep : Obstructive sleep apnea with CPAP   ALLERGIES: Allergies  Allergen Reactions  . Azithromycin Swelling    SWELLING REACTION UNSPECIFIED   . Beef-Derived Products Diarrhea    RED MEAT > UNSPECIFIED REACTION   . Milk-Related Compounds Diarrhea    DAIRY >UNSPECIFIED REACTION   . Penicillins Hives    Rash as child- tolerates Ancef/CHILDHOOD ALLERGY Has patient had a PCN reaction causing immediate rash, facial/tongue/throat swelling, SOB or lightheadedness with hypotension: YES Has patient had a PCN reaction causing severe rash involving mucus membranes or skin necrosis: Unknown Has patient had a PCN reaction that required hospitalization: Unknown Has patient had a PCN reaction occurring within the last 10 years:Unknown If all of the above answers are "NO", then may proceed with Cephalosporin [place]  . Sulfonamide Derivatives Hives  . Dilaudid [Hydromorphone Hcl] Nausea And Vomiting    HOME MEDICATIONS: Outpatient Medications Prior to Visit  Medication Sig Dispense Refill  . Ascorbic Acid (VITAMIN C PO) Take 1 tablet at bedtime by mouth.    Marland Kitchen aspirin EC 81 MG tablet Take 81 mg at bedtime by mouth.     . Calcium Carb-Cholecalciferol (CALCIUM-VITAMIN D) 500-200 MG-UNIT tablet Take 1 tablet 2 (two) times daily by mouth.    . cholecalciferol (VITAMIN D) 1000 units tablet Take 1,000 Units at bedtime by mouth.     . diltiazem (DILACOR XR) 180 MG 24 hr capsule Take 180 mg by mouth daily.    Marland Kitchen  esomeprazole (NEXIUM) 40 MG capsule TAKE 1 CAPSULE TWICE A DAY 180 capsule 0  . Eszopiclone 3 MG TABS Take 1 tablet (3 mg total) by mouth at bedtime. Take immediately before bedtime 30 tablet 1  . ferrous sulfate 325 (65 FE) MG tablet Take 325 mg at bedtime by mouth.     . folic acid (FOLVITE) 1 MG tablet Take 2 tablets (2 mg total) by mouth daily. 180 tablet 3  . glucose blood (CHOICE DM FORA G20 TEST STRIPS) test strip Use as instructed 100 each 12  . Lactase (DAIRY-RELIEF PO) Take 2 tablets by mouth 2 (two) times daily.     . Lactulose 20 GM/30ML SOLN Take 30 mLs (20 g total) by mouth 2 (two) times daily. 1800 mL 3  . lamoTRIgine (LAMICTAL) 100 MG tablet Take 1 tablet (100 mg total) by mouth 2 (two) times daily. 180 tablet 3  . mercaptopurine (PURINETHOL) 50 MG tablet FOR DIRECTIONS ON HOW TO   TAKE THIS MEDICINE, READ   THE ENCLOSED MEDICATION    INFORMATION FORM (Patient taking differently: Take 100 mg by mouth daily. FOR DIRECTIONS ON HOW TO   TAKE THIS MEDICINE, READ   THE ENCLOSED MEDICATION    INFORMATION FORM) 180 tablet 1  . mesalamine (LIALDA) 1.2 g EC tablet Take 2 tablets (2.4 g total) by mouth daily. 180 tablet 1  . methscopolamine (PAMINE FORTE) 5 MG tablet Take 1  tablet (5 mg total) by mouth 2 (two) times daily. 180 tablet 3  . ONE TOUCH ULTRA TEST test strip USE AS INSTRUCTED 100 each 2  . potassium chloride SA (K-DUR,KLOR-CON) 20 MEQ tablet Take 1 tablet (20 mEq total) by mouth 2 (two) times daily. 60 tablet 0  . Probiotic Product (PROBIOTIC PO) Take 1 tablet at bedtime by mouth.     . rifaximin (XIFAXAN) 550 MG TABS tablet Take 1 tablet (550 mg total) by mouth 2 (two) times daily. 180 tablet 3  . sertraline (ZOLOFT) 100 MG tablet Take 200 mg daily with breakfast by mouth.     . TraZODone HCl 150 MG TB24 Take by mouth. Once tablet daily    . Ibuprofen-Diphenhydramine Cit (ADVIL PM PO) Take 1 tablet at bedtime by mouth.     . diazepam (VALIUM) 10 MG tablet TK 1 T PO 1 HOUR PRIOR  TO PROCEDURE. REPEAT PRN  0  . diltiazem (CARDIZEM) 30 MG tablet Take 30 mg by mouth 2 (two) times daily.    Marland Kitchen HYDROcodone-acetaminophen (NORCO) 5-325 MG tablet Take 1-2 tablets by mouth every 6 (six) hours as needed. (Patient not taking: Reported on 10/07/2017) 20 tablet 0  . loratadine (CLARITIN) 10 MG tablet Take 10 mg at bedtime by mouth.     . naproxen (NAPROSYN) 500 MG tablet TK 1 T PO BID  0  . traZODone (DESYREL) 100 MG tablet Take 50-100 mg at bedtime by mouth.      Facility-Administered Medications Prior to Visit  Medication Dose Route Frequency Provider Last Rate Last Dose  . gadopentetate dimeglumine (MAGNEVIST) injection 20 mL  20 mL Intravenous Once PRN Marcial Pacas, MD        PAST MEDICAL HISTORY: Past Medical History:  Diagnosis Date  . Adenomatous colon polyp 10/1999  . Anxiety   . Arthritis    neck, yoga helps.  . Cancer ([place])    skin  . Cataract   . Complex partial seizure ([place])   . Crohn's disease of small and large intestines ([place]) 1999  . Depression   . Diabetes mellitus   . Esophageal stricture   . External hemorrhoids   . GERD (gastroesophageal reflux disease)   . History of kidney stones    3 large stones still present  . Hyperlipemia   . Hypertension   . Migraines   . MRSA (methicillin resistant Staphylococcus aureus) 10/2010   . Osteoporosis   . PONV (postoperative nausea and vomiting)   . PTSD (post-traumatic stress disorder)   . Renal cyst, acquired, left 07/08/2017  . Renal lesion 05/28/2017  . Seizures ([place])   . Skin cancer    squamous cell multiple; [place]; followed every 3 months.  . Sleep apnea    CPAP machine, uses nightly  . Staphylococcus aureus bacteremia with sepsis ([place]) 05/28/2017    PAST SURGICAL HISTORY: Past Surgical History:  Procedure Laterality Date  . CATARACT EXTRACTION Bilateral   . ELBOW SURGERY  2012   elbow MRSA infection   . EYE SURGERY     cataracts removed, /w IOL  . INSERTION OF MESH N/A 07/01/2016    Procedure: INSERTION OF MESH;  Surgeon: [name], MD;  Location: [place];  Service: General;  Laterality: N/A;  . SHOULDER SURGERY    . SINUS SURGERY WITH INSTATRAK    . TEE WITHOUT CARDIOVERSION N/A 05/09/2017   Procedure: TRANSESOPHAGEAL ECHOCARDIOGRAM (TEE);  Surgeon: [name], MD;  Location: [facility] ENDOSCOPY;  Service: Cardiovascular;  Laterality: N/A;  .  TRANSTHORACIC ECHOCARDIOGRAM  02/22/2011   EF 55-65%; increased pattern of LVH with mild conc hypertrophy, abnormal relaxation & increased filling pressure (grade 2 diastolic dysfunction); atrial septum thickened (lipomatous hypertrophy)  . UMBILICAL HERNIA REPAIR N/A 07/01/2016   Procedure: UMBILICAL HERNIA REPAIR WITH MESH;  Surgeon: [name], MD;  Location: [place];  Service: General;  Laterality: N/A;  . VASECTOMY      FAMILY HISTORY: Family History  Problem Relation Age of Onset  . Colon polyps Father   . Stroke Father   . Hyperlipidemia Father   . Hypertension Father   . Heart disease Mother        CABG at age 75  . Hyperlipidemia Mother   . Hypertension Mother   . Stroke Paternal Grandmother   . Stroke Paternal Grandfather   . Other Child        tetrology of fallot (cornealia deland syndrome)  . Colon cancer Neg Hx     SOCIAL HISTORY: Social History   Socioeconomic History  . Marital status: Married    Spouse name: [name]  . Number of children: 2  . Years of education: college  . Highest education level: Not on file  Occupational History  . Occupation: disabled    Comment: seizure disorder with memory impairment  Social Needs  . Financial resource strain: Not on file  . Food insecurity:    Worry: Not on file    Inability: Not on file  . Transportation needs:    Medical: Not on file    Non-medical: Not on file  Tobacco Use  . Smoking status: Never Smoker  . Smokeless tobacco: Never Used  Substance and Sexual Activity  . Alcohol use: No  . Drug use: No  . Sexual activity: Yes    Birth  control/protection: Surgical    Comment: 1 partner in last 12 months  Lifestyle  . Physical activity:    Days per week: Not on file    Minutes per session: Not on file  . Stress: Not on file  Relationships  . Social connections:    Talks on phone: Not on file    Gets together: Not on file    Attends religious service: Not on file    Active member of club or organization: Not on file    Attends meetings of clubs or organizations: Not on file    Relationship status: Not on file  . Intimate partner violence:    Fear of current or ex partner: Not on file    Emotionally abused: Not on file    Physically abused: Not on file    Forced sexual activity: Not on file  Other Topics Concern  . Not on file  Social History Narrative   Marital status: married x 32 years;       Lives:   lives at home with his family.      Children:  two children; no grandchildren.  Son is special needs at age 52yo; Cornelian Delaine with TOF.       Employment: unemployed; [employer] long term disability      Tobacco: none      Alcohol: none      Drugs: none      Exercise: unable to exercise due to frequent falls   Patient has a college education.   Patient is right-handed.   Patient drinks one cup of soda daily.     PHYSICAL EXAM  Vitals:   10/07/17 0851  BP: 113/73  Pulse:  70  Weight: 202 lb 6.4 oz (91.8 kg)  Height: 5' 11"  (1.803 m)   Body mass index is 28.23 kg/m.  Generalized: Well developed, obese male in no acute distress  Head: normocephalic and atraumatic,. Oropharynx benign  Neck: Supple, no carotid bruits  Cardiac: Regular rate rhythm, no murmur  Musculoskeletal: No deformity   Neurological examination   Mentation: Alert  MMSE - Mini Mental State Exam 10/07/2017 04/02/2017 04/30/2016  Orientation to time 2 2 3   Orientation to Place 4 5 3   Registration 3 3 3   Attention/ Calculation 2 5 3   Recall 3 1 2   Language- name 2 objects 2 2 2   Language- repeat 1 1 1   Language- follow  3 step command 2 3 3   Language- read & follow direction 1 1 1   Write a sentence 1 1 1   Copy design 0 1 1  Total score 21 25 23   AFT 5 Clock drawing 4/4 Follows all commands speech and language fluent.   Cranial nerve II-XII: Fundoscopic exam reveals sharp disc margins.Pupils were equal round reactive to light extraocular movements were full, visual field were full on confrontational test. Facial sensation and strength were normal. hearing was intact to finger rubbing bilaterally. Uvula tongue midline. head turning and shoulder shrug were normal and symmetric.Tongue protrusion into cheek strength was normal. Motor: normal bulk and tone, full strength in the BUE, BLE, Sensory: Decreased light touch pinprick and vibratory of toes   Coordination: finger-nose-finger, heel-to-shin bilaterally, no dysmetria Reflexes: Brachioradialis 2/2, biceps 2/2, triceps 2/2, patellar 2/2, Achilles 2/2, plantar responses were flexor bilaterally. Gait and Station: Rising up from seated position without assistance, normal stance,  moderate stride, good arm swing, smooth turning, able to perform tiptoe, and heel walking without difficulty. Tandem gait is unsteady  DIAGNOSTIC DATA (LABS, IMAGING, TESTING) - I reviewed patient records, labs, notes, testing and imaging myself where available.  Lab Results  Component Value Date   WBC 5.8 08/27/2017   HGB 12.5 (L) 08/27/2017   HCT 35.0 (L) 08/27/2017   MCV 109.0 (H) 08/27/2017   PLT 132.0 (L) 08/27/2017      Component Value Date/Time   NA 143 09/08/2017 1210   NA 144 07/26/2017 1224   K 3.9 09/08/2017 1210   CL 109 09/08/2017 1210   CO2 26 09/08/2017 1210   GLUCOSE 101 (H) 09/08/2017 1210   BUN 12 09/08/2017 1210   BUN 8 07/26/2017 1224   CREATININE 0.88 09/08/2017 1210   CREATININE 0.84 05/15/2016 1005   CALCIUM 9.1 09/08/2017 1210   PROT 6.5 09/08/2017 1210   PROT 6.6 07/26/2017 1224   ALBUMIN 4.0 09/08/2017 1210   ALBUMIN 4.0 07/26/2017 1224   AST 33  09/08/2017 1210   ALT 23 09/08/2017 1210   ALKPHOS 160 (H) 09/08/2017 1210   BILITOT 2.9 (H) 09/08/2017 1210   BILITOT 2.7 (H) 07/26/2017 1224   GFRNONAA 96 07/26/2017 1224   GFRNONAA >89 06/03/2015 0858   GFRAA 111 07/26/2017 1224   GFRAA >89 06/03/2015 0858   Lab Results  Component Value Date   CHOL 144 02/11/2017   HDL 64 02/11/2017   LDLCALC 60 02/11/2017   TRIG 100 02/11/2017   CHOLHDL 2.3 02/11/2017   Lab Results  Component Value Date   HGBA1C 4.6 05/23/2017   Lab Results  Component Value Date   VITAMINB12 >2000 (H) 06/04/2017   Lab Results  Component Value Date   TSH 2.57 08/27/2017      ASSESSMENT AND PLAN 56  y.o. year old male with recurrent episodes of sudden onset of confusion, previous video EEG monitoring failed to show epileptiform discharges during the spells suggestive of pseudoseizures.  Neuropsych eval in September 2018 suggests a diagnosis of conversion disorder.  He is currently seeing psychiatry.  He also has a history of obstructive sleep apnea and reports his machine is 56 years old and not working well.  PLAN: Increase Lamictal to 150 twice daily Will get Lamictal level today We will order sleep study to reestablish that patient has obstructive sleep apnea he needs a new machine Use a cane for safer ambulation Continue follow-up with psychiatry Follow-up with me in 6 months Dennie Bible, [facility], [facility], [facility] [address], [place], [place] [address] [phone]

## 2017-10-07 ENCOUNTER — Other Ambulatory Visit: Payer: Self-pay | Admitting: Nurse Practitioner

## 2017-10-07 ENCOUNTER — Telehealth: Payer: Self-pay | Admitting: Gastroenterology

## 2017-10-07 ENCOUNTER — Encounter: Payer: Self-pay | Admitting: Nurse Practitioner

## 2017-10-07 ENCOUNTER — Ambulatory Visit (INDEPENDENT_AMBULATORY_CARE_PROVIDER_SITE_OTHER): Payer: 59 | Admitting: Nurse Practitioner

## 2017-10-07 VITALS — BP 113/73 | HR 70 | Ht 71.0 in | Wt 202.4 lb

## 2017-10-07 DIAGNOSIS — G40209 Localization-related (focal) (partial) symptomatic epilepsy and epileptic syndromes with complex partial seizures, not intractable, without status epilepticus: Secondary | ICD-10-CM

## 2017-10-07 DIAGNOSIS — G4733 Obstructive sleep apnea (adult) (pediatric): Secondary | ICD-10-CM | POA: Diagnosis not present

## 2017-10-07 DIAGNOSIS — Z9989 Dependence on other enabling machines and devices: Secondary | ICD-10-CM | POA: Diagnosis not present

## 2017-10-07 DIAGNOSIS — Z5181 Encounter for therapeutic drug level monitoring: Secondary | ICD-10-CM

## 2017-10-07 DIAGNOSIS — R296 Repeated falls: Secondary | ICD-10-CM

## 2017-10-07 DIAGNOSIS — R413 Other amnesia: Secondary | ICD-10-CM | POA: Diagnosis not present

## 2017-10-07 MED ORDER — LAMOTRIGINE 100 MG PO TABS: ORAL_TABLET | ORAL | 2 refills | Status: DC

## 2017-10-07 NOTE — Telephone Encounter (Signed)
Patient states he saw Dr.Yan as referred by Dr.Stark and per Dr.Yan patient ammonia levels are just a tiny bit neurological but mostly coming from somewhere else. Pt wanting to let Dr.Stark know. FYI

## 2017-10-07 NOTE — Patient Instructions (Addendum)
Increase Lamictal to 150 twice daily Will get Lamictal level We will order sleep study to reestablish that patient has obstructive sleep apnea he needs a new machine Continue follow-up with psychiatry Use cane at all times at risk for falls Follow-up with me in 6 months

## 2017-10-07 NOTE — Telephone Encounter (Signed)
Awaiting lab results).   Order already sent yesterday.

## 2017-10-07 NOTE — Telephone Encounter (Signed)
Pt called to advise he will need  rx for lamictal 164m sent to [facility]. He will run out of the medication he currently has in 1-1/2 weeks.

## 2017-10-07 NOTE — Telephone Encounter (Signed)
Dr. Fuller Plan please see note from [place] on patient from today.  Patient wanted you to be aware

## 2017-10-08 ENCOUNTER — Telehealth: Payer: Self-pay

## 2017-10-08 ENCOUNTER — Other Ambulatory Visit: Payer: Self-pay | Admitting: Neurology

## 2017-10-08 DIAGNOSIS — G4733 Obstructive sleep apnea (adult) (pediatric): Secondary | ICD-10-CM

## 2017-10-08 DIAGNOSIS — M67911 Unspecified disorder of synovium and tendon, right shoulder: Secondary | ICD-10-CM | POA: Diagnosis not present

## 2017-10-08 DIAGNOSIS — Z9989 Dependence on other enabling machines and devices: Principal | ICD-10-CM

## 2017-10-08 NOTE — Progress Notes (Signed)
I have reviewed and agreed above plan. 

## 2017-10-08 NOTE — Telephone Encounter (Signed)
[employer] denied in lab sleep study, Need Home sleep study order

## 2017-10-09 ENCOUNTER — Telehealth: Payer: Self-pay | Admitting: Neurology

## 2017-10-09 LAB — LAMOTRIGINE LEVEL: LAMOTRIGINE LVL: 9.5 ug/mL (ref 2.0–20.0)

## 2017-10-09 MED ORDER — LAMOTRIGINE 150 MG PO TABS: 150.0000 mg | ORAL_TABLET | Freq: Every day | ORAL | 3 refills | Status: DC

## 2017-10-09 NOTE — Telephone Encounter (Signed)
Called the patient and spoke with the wife in regards to his labs. I made her aware that the lab work was WNL and that carolyn would like to continue the lamictal 150 mg BID. I have instructed the pt of this and I have also fixed the script for the pt so that they can get the 150 mg tablets instead of having to cut tablets in half. I sent that to [facility] pharmacy for the pt. Pt's wife verbalized understanding.

## 2017-10-09 NOTE — Telephone Encounter (Signed)
-----   Message from Dennie Bible, NP sent at 10/09/2017  8:44 AM EDT ----- Lamictal level within therapeutic range.  Dose was increased on visit.  Continue 150 twice daily of Lamictal.  Please call the patient

## 2017-10-14 ENCOUNTER — Ambulatory Visit: Payer: 59 | Admitting: Gastroenterology

## 2017-10-14 ENCOUNTER — Ambulatory Visit: Payer: 59 | Admitting: Nurse Practitioner

## 2017-10-16 ENCOUNTER — Other Ambulatory Visit: Payer: Self-pay

## 2017-10-16 ENCOUNTER — Emergency Department (HOSPITAL_COMMUNITY): Payer: 59

## 2017-10-16 ENCOUNTER — Encounter (HOSPITAL_COMMUNITY): Payer: Self-pay | Admitting: Emergency Medicine

## 2017-10-16 ENCOUNTER — Emergency Department (HOSPITAL_COMMUNITY)
Admission: EM | Admit: 2017-10-16 | Discharge: 2017-10-16 | Disposition: A | Discharge status: Home / Self Care | Payer: 59 | Attending: Emergency Medicine | Admitting: Emergency Medicine

## 2017-10-16 DIAGNOSIS — Y92013 Bedroom of single-family (private) house as the place of occurrence of the external cause: Secondary | ICD-10-CM | POA: Diagnosis not present

## 2017-10-16 DIAGNOSIS — S0990XA Unspecified injury of head, initial encounter: Secondary | ICD-10-CM | POA: Diagnosis not present

## 2017-10-16 DIAGNOSIS — M542 Cervicalgia: Secondary | ICD-10-CM | POA: Diagnosis not present

## 2017-10-16 DIAGNOSIS — W19XXXA Unspecified fall, initial encounter: Secondary | ICD-10-CM

## 2017-10-16 DIAGNOSIS — S060X0A Concussion without loss of consciousness, initial encounter: Secondary | ICD-10-CM | POA: Diagnosis not present

## 2017-10-16 DIAGNOSIS — Y939 Activity, unspecified: Secondary | ICD-10-CM | POA: Insufficient documentation

## 2017-10-16 DIAGNOSIS — S199XXA Unspecified injury of neck, initial encounter: Secondary | ICD-10-CM | POA: Diagnosis not present

## 2017-10-16 DIAGNOSIS — W2209XA Striking against other stationary object, initial encounter: Secondary | ICD-10-CM | POA: Diagnosis not present

## 2017-10-16 DIAGNOSIS — Y999 Unspecified external cause status: Secondary | ICD-10-CM | POA: Diagnosis not present

## 2017-10-16 DIAGNOSIS — R51 Headache: Secondary | ICD-10-CM | POA: Diagnosis not present

## 2017-10-16 DIAGNOSIS — S0101XA Laceration without foreign body of scalp, initial encounter: Secondary | ICD-10-CM | POA: Diagnosis not present

## 2017-10-16 HISTORY — PX: SCALP LACERATION REPAIR: SHX6089

## 2017-10-16 MED ORDER — BACITRACIN ZINC 500 UNIT/GM EX OINT
TOPICAL_OINTMENT | Freq: Every day | CUTANEOUS | Status: DC
  Administered 2017-10-16: 1 via TOPICAL
  Filled 2017-10-16: qty 0.9

## 2017-10-16 MED ORDER — SODIUM BICARBONATE 4 % IV SOLN
5.0000 mL | Freq: Once | INTRAVENOUS | Status: AC
  Administered 2017-10-16: 5 mL via SUBCUTANEOUS
  Filled 2017-10-16: qty 5

## 2017-10-16 MED ORDER — SODIUM CHLORIDE 0.9 % IJ SOLN
INTRAMUSCULAR | Status: AC
  Filled 2017-10-16: qty 50

## 2017-10-16 MED ORDER — IOPAMIDOL (ISOVUE-370) INJECTION 76%
INTRAVENOUS | Status: AC
  Filled 2017-10-16: qty 100

## 2017-10-16 MED ORDER — IOPAMIDOL (ISOVUE-370) INJECTION 76%
100.0000 mL | Freq: Once | INTRAVENOUS | Status: AC | PRN
  Administered 2017-10-16: 80 mL via INTRAVENOUS

## 2017-10-16 MED ORDER — LIDOCAINE HCL (PF) 1 % IJ SOLN
5.0000 mL | Freq: Once | INTRAMUSCULAR | Status: AC
  Administered 2017-10-16: 5 mL
  Filled 2017-10-16: qty 30

## 2017-10-16 NOTE — ED Triage Notes (Signed)
Pt reports that hit his head on night stand when he was on his way to lay down due to having a headache. Pt has about 2-3 inch laceration on right head. Bleeding controlled at this time. EMT placing sterile guaze and wrapping head in triage.

## 2017-10-16 NOTE — ED Triage Notes (Signed)
Pt adds that having worsening blurred vision and dizziness since hitting his head. Takes ASA daily.

## 2017-10-16 NOTE — ED Notes (Signed)
Pt returned from CT °

## 2017-10-16 NOTE — Discharge Instructions (Addendum)
Have the staples out in 10 days.

## 2017-10-16 NOTE — ED Provider Notes (Signed)
..  Laceration Repair Date/Time: 10/16/2017 9:30 PM Performed by: Lorayne Bender, PA-C Authorized by: Lorayne Bender, PA-C   Consent:    Consent obtained:  Verbal   Consent given by:  Patient   Risks discussed:  Infection, need for additional repair, poor cosmetic result and poor wound healing Anesthesia (see MAR for exact dosages):    Anesthesia method:  Local infiltration   Local anesthetic:  Lidocaine 1% w/o epi and sodium bicarbonate Laceration details:    Location:  Scalp   Scalp location:  Mid-scalp   Length (cm):  4.5 Repair type:    Repair type:  Simple Pre-procedure details:    Preparation:  Patient was prepped and draped in usual sterile fashion Exploration:    Hemostasis achieved with:  Direct pressure   Wound exploration: wound explored through full range of motion   Treatment:    Area cleansed with:  Betadine and saline   Amount of cleaning:  Standard   Irrigation solution:  Sterile saline   Irrigation method:  Syringe Skin repair:    Repair method:  Staples   Number of staples:  5 Approximation:    Approximation:  Close Post-procedure details:    Dressing:  Antibiotic ointment and sterile dressing   Patient tolerance of procedure:  Tolerated well, no immediate complications      Layla Maw 10/16/17 2150    Davonna Belling, MD 10/17/17 0005

## 2017-10-17 NOTE — ED Provider Notes (Signed)
[facility] DEPT Provider Note   CSN: [id number] Arrival date & time: 10/16/17  1700     History   Chief Complaint Chief Complaint  Patient presents with  . Head Injury    HPI [name] [name] is a 57 y.o. male.  HPI Patient presents after a fall.  Lost his balance and fell striking his head.  States he hit it on the bedside table.  No loss consciousness.  States he is on that he at baseline is gotten worse since the fall.  Laceration on the top of his head and does have some posterior neck pain.  No numbness or weakness.  No confusion.  States his vision is blurred since the fall.  He is not on anticoagulation. Past Medical History:  Diagnosis Date  . Adenomatous colon polyp 10/1999  . Anxiety   . Arthritis    neck, yoga helps.  . Cancer ([place])    skin  . Cataract   . Complex partial seizure ([place])   . Crohn's disease of small and large intestines ([place]) 1999  . Depression   . Diabetes mellitus   . Esophageal stricture   . External hemorrhoids   . GERD (gastroesophageal reflux disease)   . History of kidney stones    3 large stones still present  . Hyperlipemia   . Hypertension   . Migraines   . MRSA (methicillin resistant Staphylococcus aureus) 10/2010   . Osteoporosis   . PONV (postoperative nausea and vomiting)   . PTSD (post-traumatic stress disorder)   . Renal cyst, acquired, left 07/08/2017  . Renal lesion 05/28/2017  . Seizures ([place])   . Skin cancer    squamous cell multiple; [facility]; followed every 3 months.  . Sleep apnea    CPAP machine, uses nightly  . Staphylococcus aureus bacteremia with sepsis ([place]) 05/28/2017    Patient Active Problem List   Diagnosis Date Noted  . Therapeutic drug monitoring 10/07/2017  . Renal cyst, acquired, left 07/08/2017  . Frequent falls 07/08/2017  . Staphylococcus aureus bacteremia with sepsis ([place]) 05/28/2017  . Renal lesion 05/28/2017  . Bacteremia due to Staphylococcus aureus 05/06/2017  .  MSSA bacteremia 05/06/2017  . Confusion 04/02/2017  . Syncope 08/30/2016  . Depression 04/30/2016  . Memory loss 04/30/2016  . OSA on CPAP 10/31/2015  . Nonepileptic episode ([place]) 04/25/2015  . Seizure ([place]) 04/04/2013  . Abnormal EKG 03/12/2013  . DOE (dyspnea on exertion) 03/12/2013  . Complex partial seizure ([place]) 04/19/2012  . DM2 (diabetes mellitus, type 2) ([place]) 09/25/2011  . Altered mental status 09/25/2011  . OTHER DYSPHAGIA 03/27/2009  . TRANSAMINASES, SERUM, ELEVATED 03/27/2009  . Cough 11/10/2008  . GERD 12/07/2007  . COLONIC POLYPS, ADENOMATOUS, HX OF 12/07/2007  . EXTERNAL HEMORRHOIDS 09/11/2007  . CROHN'S DISEASE, LARGE AND SMALL INTESTINES 09/11/2007  . OSTEOPOROSIS 09/11/2007  . HYPERLIPIDEMIA NEC/NOS 02/27/2007  . Essential hypertension 02/27/2007    Past Surgical History:  Procedure Laterality Date  . CATARACT EXTRACTION Bilateral   . ELBOW SURGERY  2012   elbow MRSA infection   . EYE SURGERY     cataracts removed, /w IOL  . INSERTION OF MESH N/A 07/01/2016   Procedure: INSERTION OF MESH;  Surgeon: [name], MD;  Location: [facility];  Service: General;  Laterality: N/A;  . SHOULDER SURGERY    . SINUS SURGERY WITH INSTATRAK    . TEE WITHOUT CARDIOVERSION N/A 05/09/2017   Procedure: TRANSESOPHAGEAL ECHOCARDIOGRAM (TEE);  Surgeon: [name], MD;  Location: [facility];  Service: Cardiovascular;  Laterality: N/A;  . TRANSTHORACIC ECHOCARDIOGRAM  02/22/2011   EF 55-65%; increased pattern of LVH with mild conc hypertrophy, abnormal relaxation & increased filling pressure (grade 2 diastolic dysfunction); atrial septum thickened (lipomatous hypertrophy)  . UMBILICAL HERNIA REPAIR N/A 07/01/2016   Procedure: UMBILICAL HERNIA REPAIR WITH MESH;  Surgeon: [name], MD;  Location: [facility];  Service: General;  Laterality: N/A;  . VASECTOMY          Home Medications    Prior to Admission medications   Medication Sig Start Date End Date Taking?  Authorizing Provider  Ascorbic Acid (VITAMIN C PO) Take 1 tablet at bedtime by mouth.   Yes [provider]  aspirin EC 81 MG tablet Take 81 mg at bedtime by mouth.    Yes [provider]  Calcium Carb-Cholecalciferol (CALCIUM-VITAMIN D) 500-200 MG-UNIT tablet Take 1 tablet 2 (two) times daily by mouth.   Yes [provider]  cholecalciferol (VITAMIN D) 1000 units tablet Take 1,000 Units at bedtime by mouth.    Yes [provider]  Cyanocobalamin (VITAMIN B 12 PO) Take 1 tablet by mouth daily.   Yes [provider]  diltiazem (DILACOR XR) 180 MG 24 hr capsule Take 180 mg by mouth daily.   Yes [provider]  esomeprazole (NEXIUM) 40 MG capsule TAKE 1 CAPSULE TWICE A DAY 07/04/17  Yes Ladene Artist, MD  Eszopiclone 3 MG TABS Take 1 tablet (3 mg total) by mouth at bedtime. Take immediately before bedtime 03/21/17  Yes Wardell Honour, MD  ferrous sulfate 325 (65 FE) MG tablet Take 325 mg at bedtime by mouth.    Yes [provider]  folic acid (FOLVITE) 1 MG tablet Take 2 tablets (2 mg total) by mouth daily. 11/19/16  Yes Wardell Honour, MD  Lactase (DAIRY-RELIEF PO) Take 2 tablets by mouth 2 (two) times daily.    Yes [provider]  Lactulose 20 GM/30ML SOLN Take 30 mLs (20 g total) by mouth 2 (two) times daily. 09/15/17  Yes Ladene Artist, MD  lamoTRIgine (LAMICTAL) 150 MG tablet Take 1 tablet (150 mg total) by mouth daily. 10/09/17  Yes Dennie Bible, NP  mercaptopurine (PURINETHOL) 50 MG tablet FOR DIRECTIONS ON HOW TO   TAKE THIS MEDICINE, READ   THE ENCLOSED MEDICATION    INFORMATION FORM Patient taking differently: Take 100 mg by mouth daily. FOR DIRECTIONS ON HOW TO   TAKE THIS MEDICINE, READ   THE ENCLOSED MEDICATION    INFORMATION FORM 08/27/17  Yes Ladene Artist, MD  mesalamine (LIALDA) 1.2 g EC tablet Take 2 tablets (2.4 g total) by mouth daily. 08/27/17  Yes Ladene Artist, MD  methscopolamine (PAMINE  FORTE) 5 MG tablet Take 1 tablet (5 mg total) by mouth 2 (two) times daily. 07/21/17  Yes Ladene Artist, MD  potassium chloride SA (K-DUR,KLOR-CON) 20 MEQ tablet Take 1 tablet (20 mEq total) by mouth 2 (two) times daily. 08/27/17  Yes Ladene Artist, MD  Probiotic Product (PROBIOTIC PO) Take 1 tablet at bedtime by mouth.    Yes [provider]  rifaximin (XIFAXAN) 550 MG TABS tablet Take 1 tablet (550 mg total) by mouth 2 (two) times daily. 08/27/17  Yes Ladene Artist, MD  sertraline (ZOLOFT) 100 MG tablet Take 200 mg daily with breakfast by mouth.  03/16/17  Yes [provider]  TraZODone HCl 150 MG TB24 Take by mouth. Once  tablet daily   Yes [provider]  glucose blood (CHOICE DM FORA G20 TEST STRIPS) test strip Use as instructed 11/13/12   [name], MD  ONE TOUCH ULTRA TEST test strip USE AS INSTRUCTED    Weber, Damaris Hippo, PA-C    Family History Family History  Problem Relation Age of Onset  . Colon polyps Father   . Stroke Father   . Hyperlipidemia Father   . Hypertension Father   . Heart disease Mother        CABG at age 75  . Hyperlipidemia Mother   . Hypertension Mother   . Stroke Paternal Grandmother   . Stroke Paternal Grandfather   . Other Child        tetrology of fallot (cornealia deland syndrome)  . Colon cancer Neg Hx     Social History Social History   Tobacco Use  . Smoking status: Never Smoker  . Smokeless tobacco: Never Used  Substance Use Topics  . Alcohol use: No  . Drug use: No     Allergies   Azithromycin; Beef-derived products; Milk-related compounds; Penicillins; Sulfonamide derivatives; and Dilaudid [hydromorphone hcl]   Review of Systems Review of Systems  Constitutional: Negative for appetite change and fever.  HENT: Negative for congestion.   Eyes: Positive for visual disturbance.  Respiratory: Negative for shortness of breath.   Cardiovascular: Negative for chest pain.  Gastrointestinal: Negative for  abdominal pain.  Genitourinary: Negative for flank pain.  Musculoskeletal: Positive for neck pain.  Skin: Positive for wound.  Neurological: Positive for dizziness. Negative for weakness.  Hematological: Negative for adenopathy.  Psychiatric/Behavioral: Negative for confusion.     Physical Exam Updated Vital Signs BP (!) 146/72   Pulse 80   Resp 16   Ht 5' 11"  (1.803 m)   Wt 88.5 kg (195 lb)   SpO2 97%   BMI 27.20 kg/m   Physical Exam  Constitutional: He is oriented to person, place, and time. He appears well-developed.  HENT:  Head: Normocephalic.  Approximately 4 cm linear laceration on top of scalp.  Eyes: Pupils are equal, round, and reactive to light.  Neck:  Mild midline cervical tenderness without step-off or deformity.  Cardiovascular: Normal rate.  Pulmonary/Chest: Effort normal.  Abdominal: There is no tenderness.  Musculoskeletal: He exhibits no edema.  Neurological: He is alert and oriented to person, place, and time.  Some nystagmus with eye movements.  Skin: Skin is warm. Capillary refill takes less than 2 seconds.     ED Treatments / Results  Labs (all labs ordered are listed, but only abnormal results are displayed) Labs Reviewed - No data to display  EKG None  Radiology Ct Head Wo Contrast  Result Date: 10/16/2017 CLINICAL DATA:  Struck head on furniture with subsequent headache. Laceration on the right with bleeding. EXAM: CT HEAD WITHOUT CONTRAST CT CERVICAL SPINE WITHOUT CONTRAST TECHNIQUE: Multidetector CT imaging of the head and cervical spine was performed following the standard protocol without intravenous contrast. Multiplanar CT image reconstructions of the cervical spine were also generated. COMPARISON:  MRI 11/01/2015.  CT 01/05/2013. FINDINGS: CT HEAD FINDINGS Brain: Mild generalized atrophy. No evidence of old or acute focal infarction, mass lesion, hemorrhage, hydrocephalus or extra-axial collection. Vascular: No abnormal vascular  finding. Skull: Negative Sinuses/Orbits: Clear presently. Previous functional endoscopic sinus surgery. Orbits negative. Other: None CT CERVICAL SPINE FINDINGS Alignment: No traumatic malalignment. Skull base and vertebrae: No fracture or primary bone lesion. Soft tissues and spinal canal: No soft tissue  lesions seen. Disc levels: Prominent anterior osteophytes which could be associated with dysphagia. Ordinary osteoarthritis at the C1-2 articulation. Small uncovertebral osteophytes at C3-4 and C4-5 without significant canal or foraminal encroachment. Upper chest: Negative Other: None IMPRESSION: Head CT: Mild generalized atrophy.  No acute or traumatic finding. Cervical spine CT: No acute or traumatic finding. Prominent anterior osteophytes which can occasionally be associated with dysphagia. Electronically Signed   By: Nelson Chimes M.D.   On: 10/16/2017 20:16   Ct Angio Neck W And/or Wo Contrast  Result Date: 10/16/2017 CLINICAL DATA:  [place] and hit head on the nightstand. EXAM: CT ANGIOGRAPHY NECK TECHNIQUE: Multidetector CT imaging of the neck was performed using the standard protocol during bolus administration of intravenous contrast. Multiplanar CT image reconstructions and MIPs were obtained to evaluate the vascular anatomy. Carotid stenosis measurements (when applicable) are obtained utilizing NASCET criteria, using the distal internal carotid diameter as the denominator. CONTRAST:  4m ISOVUE-370 IOPAMIDOL (ISOVUE-370) INJECTION 76% COMPARISON:  None. FINDINGS: Aortic arch: Normal Right carotid system: Common carotid artery widely patent to the bifurcation. Carotid bifurcation is normal without soft or calcified plaque. Cervical ICA is widely patent to and through the skull base. Left carotid system: Common carotid artery widely patent to the bifurcation region. Minimal atherosclerotic plaque at the bifurcation but no stenosis or irregularity. Cervical ICA is widely patent to and through the skull  base. Vertebral arteries: Both vertebral artery origins are widely patent. Both vertebral arteries appear normal through the cervical region, through the foramen magnum and to the basilar. Skeleton: No acute finding.  See results of cervical spine CT. Other neck: No soft tissue mass or finding. Upper chest: Negative IMPRESSION: Negative CT angiography of the neck. The study is normal with exception of minimal atherosclerosis at the left carotid bifurcation without stenosis or irregularity. Electronically Signed   By: MNelson ChimesM.D.   On: 10/16/2017 20:26   Ct Cervical Spine Wo Contrast  Result Date: 10/16/2017 CLINICAL DATA:  Struck head on furniture with subsequent headache. Laceration on the right with bleeding. EXAM: CT HEAD WITHOUT CONTRAST CT CERVICAL SPINE WITHOUT CONTRAST TECHNIQUE: Multidetector CT imaging of the head and cervical spine was performed following the standard protocol without intravenous contrast. Multiplanar CT image reconstructions of the cervical spine were also generated. COMPARISON:  MRI 11/01/2015.  CT 01/05/2013. FINDINGS: CT HEAD FINDINGS Brain: Mild generalized atrophy. No evidence of old or acute focal infarction, mass lesion, hemorrhage, hydrocephalus or extra-axial collection. Vascular: No abnormal vascular finding. Skull: Negative Sinuses/Orbits: Clear presently. Previous functional endoscopic sinus surgery. Orbits negative. Other: None CT CERVICAL SPINE FINDINGS Alignment: No traumatic malalignment. Skull base and vertebrae: No fracture or primary bone lesion. Soft tissues and spinal canal: No soft tissue lesions seen. Disc levels: Prominent anterior osteophytes which could be associated with dysphagia. Ordinary osteoarthritis at the C1-2 articulation. Small uncovertebral osteophytes at C3-4 and C4-5 without significant canal or foraminal encroachment. Upper chest: Negative Other: None IMPRESSION: Head CT: Mild generalized atrophy.  No acute or traumatic finding. Cervical  spine CT: No acute or traumatic finding. Prominent anterior osteophytes which can occasionally be associated with dysphagia. Electronically Signed   By: MNelson ChimesM.D.   On: 10/16/2017 20:16    Procedures Procedures (including critical care time)  Medications Ordered in ED Medications  sodium chloride 0.9 % injection (has no administration in time range)  iopamidol (ISOVUE-370) 76 % injection (has no administration in time range)  bacitracin ointment (1 application Topical Given 10/16/17 2213)  iopamidol (ISOVUE-370) 76 % injection 100 mL (80 mLs Intravenous Contrast Given 10/16/17 1949)  lidocaine (PF) (XYLOCAINE) 1 % injection 5 mL (5 mLs Infiltration Given by Other 10/16/17 2130)  sodium bicarbonate (NEUT) 4 % injection 5 mL (5 mLs Subcutaneous Given by Other 10/16/17 2130)     Initial Impression / Assessment and Plan / ED Course  I have reviewed the triage vital signs and the nursing notes.  Pertinent labs & imaging results that were available during my care of the patient were reviewed by me and considered in my medical decision making (see chart for details).     Patient with fall.  Midline neck tenderness.  CT scan reassuring.  CT angiography done due to neck pain and dizziness.  Likely has concussion.  Wound closed.  Discharge home. Final Clinical Impressions(s) / ED Diagnoses   Final diagnoses:  Fall, initial encounter  Laceration of scalp, initial encounter  Concussion without loss of consciousness, initial encounter    ED Discharge Orders    None       Davonna Belling, MD 10/17/17 0008

## 2017-10-21 ENCOUNTER — Encounter: Payer: Self-pay | Admitting: Nurse Practitioner

## 2017-10-21 ENCOUNTER — Ambulatory Visit (INDEPENDENT_AMBULATORY_CARE_PROVIDER_SITE_OTHER): Payer: 59 | Admitting: Nurse Practitioner

## 2017-10-21 ENCOUNTER — Other Ambulatory Visit (INDEPENDENT_AMBULATORY_CARE_PROVIDER_SITE_OTHER): Payer: 59

## 2017-10-21 VITALS — BP 118/60 | HR 68 | Ht 71.0 in | Wt 197.1 lb

## 2017-10-21 DIAGNOSIS — K50919 Crohn's disease, unspecified, with unspecified complications: Secondary | ICD-10-CM

## 2017-10-21 DIAGNOSIS — K7460 Unspecified cirrhosis of liver: Secondary | ICD-10-CM

## 2017-10-21 LAB — COMPREHENSIVE METABOLIC PANEL
ALK PHOS: 164 U/L — AB (ref 39–117)
ALT: 22 U/L (ref 0–53)
AST: 28 U/L (ref 0–37)
Albumin: 4.1 g/dL (ref 3.5–5.2)
BILIRUBIN TOTAL: 1.9 mg/dL — AB (ref 0.2–1.2)
BUN: 12 mg/dL (ref 6–23)
CALCIUM: 9.4 mg/dL (ref 8.4–10.5)
CO2: 26 mEq/L (ref 19–32)
CREATININE: 1.28 mg/dL (ref 0.40–1.50)
Chloride: 109 mEq/L (ref 96–112)
GFR: 61.82 mL/min (ref >60.00)
Glucose, Bld: 107 mg/dL — ABNORMAL HIGH (ref 70–99)
Potassium: 4.3 mEq/L (ref 3.5–5.1)
Sodium: 142 mEq/L (ref 135–145)
TOTAL PROTEIN: 6.5 g/dL (ref 6.0–8.3)

## 2017-10-21 LAB — CBC
HCT: 31.9 % — ABNORMAL LOW (ref 39.0–52.0)
HEMOGLOBIN: 11.5 g/dL — AB (ref 13.0–17.0)
MCHC: 35.9 g/dL (ref 30.0–36.0)
MCV: 110 fl — ABNORMAL HIGH (ref 78.0–100.0)
PLATELETS: 111 10*3/uL — AB (ref 150.0–400.0)
RBC: 2.9 Mil/uL — AB (ref 4.22–5.81)
RDW: 16.2 % — ABNORMAL HIGH (ref 11.5–15.5)
WBC: 4.8 10*3/uL (ref 4.0–10.5)

## 2017-10-21 NOTE — Patient Instructions (Addendum)
If you are age 56 or older, your body mass index should be between 23-30. Your Body mass index is 27.49 kg/m. If this is out of the aforementioned range listed, please consider follow up with your Primary Care Provider.  If you are age 59 or younger, your body mass index should be between 19-25. Your Body mass index is 27.49 kg/m. If this is out of the aformentioned range listed, please consider follow up with your Primary Care Provider.   You have been scheduled for an endoscopy. Please follow written instructions given to you at your visit today. If you use inhalers (even only as needed), please bring them with you on the day of your procedure. Your physician has requested that you go to www.startemmi.com and enter the access code given to you at your visit today. This web site gives a general overview about your procedure. However, you should still follow specific instructions given to you by our office regarding your preparation for the procedure.  HOLD IRON starting today.  Your provider has requested that you go to the basement level for lab work before leaving today. Press "B" on the elevator. The lab is located at the first door on the left as you exit the elevator.  DECREASE Lactulose to 40 gm daily, titrate to 2-3 loose stools a day.  STOP if still having more bowel move ments than that.  Follow up with Dr. Fuller Plan on December 17, 2017 at 2:15 pm.  Thank you for choosing me and [facility].   Tye Savoy, NP

## 2017-10-21 NOTE — Progress Notes (Signed)
IMPRESSION and PLAN:    #54.  57 year old male with Crohn's ileocolitis, stable on 6 mp 100 mg daily and lialda 2-4 g daily. He does have chronic small volume hematochezia and but because of worsening neurologic sx over the last several months we have been uncomfortable proceeding  -Colonoscopy still on hold for now. I am concerned about him trying to prep given falls at home. The bleeding hasn't increased since he was here in March and is hgb has remained stable at 11.5.   #2. Cirrhosis on CT scan, presumed to be secondary to NASH. Serologic workup negative. No evidence for decompensation right now.  -wife had a lot of questions about cirrhosis. We discussed signs/ symptoms of decompensation, HCC surveillance, etiology of varices and need for varices screening -will proceed with management of cirrhosis. He needs EGD for varices screening. The risks and benefits of EGD were discussed and the patient agrees to proceed.  -[place] screening. No focal liver lesions on CT scan with contrast in November 2018. Will get AFP.  -He is having 6 or so liquids stools a day, probably combination of Crohn's and lactulose. I am reducing dose to once daily. Wife will ttirate back up if not having 2-3 loose BMs /day. -will need to check for HAV, HBV immunity at some point   #3. Memory problems, unsteady gait. Ammonia has been elevated around 101. On xifaxan and his lactulose was doubled a couple of weeks ago but wife didn't see any improvement in cognitive / neuro status. He has no asterixis on exam today. He is able to add and spell backwards / forwards.  If he has HE it is mild and seems unlikely to be the cause of neurologic / cognitive sx.  - His sleeping medications could be contributing to some of his symptoms. He is taking taking up to 200 mg of Trazadone in addition to lunesta and Advil pm at night to sleep.  -Wife doesn't like patient taking all the above meds for sleep. She is going to help him reduce /  eliminate them  #4. Conversion disorder (psychogenic non-epileptic seizures) vrs. seizure disorder. Neurology feels symptoms are more psychogenic but he is on anti-seizure meds.    #5. Frequent falls at home, fell a few days ago and required stitches to his head   #6. Hx of adenomatous colon polyps, due for surveillance colonoscopy.  -on hold for now. Recent falls at home. I am concerned about him trying to prep.   HPI:    Chief Complaint: cirrhosis   Patient is a 57 year old male with a complicated medical history.  He is known to Dr. Fuller Plan for a history of Crohn's ileocolitis, adenomatous colon polyps as well as cirrhosis found on CT scan.   He is maintained on 48m 100 mg  Lialda 2.4 g daily.  He takes Xifaxan and lactulose.  Wife says he is confused all the time. Last two nights he has had problems sleeping despite sleeping agents. No abdominal swelling or pain. No nausea a/ vomiting. He has problems recalling events that happened in last few minutes. He has unsteady gait, falls at home and just had stiches to top of head after a fall.    Review of systems:  No chest pain. No SOB, positive for memory disturbances.     Past Medical History:  Diagnosis Date  . Adenomatous colon polyp 10/1999  . Anxiety   . Arthritis    neck, yoga helps.  . Cancer ([place]  skin  . Cataract   . Complex partial seizure ([place])   . Crohn's disease of small and large intestines ([place]) 1999  . Depression   . Diabetes mellitus   . Esophageal stricture   . External hemorrhoids   . GERD (gastroesophageal reflux disease)   . History of kidney stones    3 large stones still present  . Hyperlipemia   . Hypertension   . Migraines   . MRSA (methicillin resistant Staphylococcus aureus) 10/2010   . Osteoporosis   . PONV (postoperative nausea and vomiting)   . PTSD (post-traumatic stress disorder)   . Renal cyst, acquired, left 07/08/2017  . Renal lesion 05/28/2017  . Seizures ([place])   . Skin cancer     squamous cell multiple; [facility]; followed every 3 months.  . Sleep apnea    CPAP machine, uses nightly  . Staphylococcus aureus bacteremia with sepsis ([place]) 05/28/2017    Patient's surgical history, family medical history, social history, medications and allergies were all reviewed in Epic    Physical Exam:     BP 118/60   Pulse 68   Ht 5' 11"  (1.803 m)   Wt 197 lb 2 oz (89.4 kg)   BMI 27.49 kg/m   GENERAL:  Well developed white male in NAD PSYCH: :Pleasant, cooperative, normal affect EENT:  conjunctiva pink, mucous membranes moist, neck supple without masses CARDIAC:  RRR, no peripheral edema PULM: Normal respiratory effort, lungs CTA bilaterally, no wheezing ABDOMEN:  Nondistended, soft, nontender. No obvious masses, no hepatomegaly,  normal bowel sounds SKIN:  turgor, no lesions seen Musculoskeletal:  Normal muscle tone, normal strength NEURO: Alert and oriented x 3, no focal neurologic deficits   Tye Savoy , NP 10/21/2017, 2:32 PM

## 2017-10-22 LAB — AFP TUMOR MARKER: AFP TUMOR MARKER: 5.6 ng/mL (ref <6.1)

## 2017-10-24 ENCOUNTER — Ambulatory Visit (AMBULATORY_SURGERY_CENTER): Payer: 59 | Admitting: Gastroenterology

## 2017-10-24 ENCOUNTER — Encounter: Payer: Self-pay | Admitting: Nurse Practitioner

## 2017-10-24 ENCOUNTER — Other Ambulatory Visit: Payer: Self-pay

## 2017-10-24 ENCOUNTER — Encounter: Payer: Self-pay | Admitting: Gastroenterology

## 2017-10-24 VITALS — BP 127/76 | HR 69 | Temp 99.5°F | Resp 17 | Ht 71.0 in | Wt 197.0 lb

## 2017-10-24 DIAGNOSIS — K317 Polyp of stomach and duodenum: Secondary | ICD-10-CM | POA: Diagnosis not present

## 2017-10-24 DIAGNOSIS — K7460 Unspecified cirrhosis of liver: Secondary | ICD-10-CM | POA: Diagnosis present

## 2017-10-24 DIAGNOSIS — I10 Essential (primary) hypertension: Secondary | ICD-10-CM | POA: Diagnosis not present

## 2017-10-24 DIAGNOSIS — L539 Erythematous condition, unspecified: Secondary | ICD-10-CM

## 2017-10-24 DIAGNOSIS — E119 Type 2 diabetes mellitus without complications: Secondary | ICD-10-CM | POA: Diagnosis not present

## 2017-10-24 MED ORDER — SODIUM CHLORIDE 0.9 % IV SOLN: 500.0000 mL | Freq: Once | INTRAVENOUS | Status: DC

## 2017-10-24 NOTE — Progress Notes (Signed)
To recovery, report to RN, VSS. 

## 2017-10-24 NOTE — Op Note (Signed)
[place] Patient Name: [name] [name] Procedure Date: 10/24/2017 10:32 AM MRN: [id number] Endoscopist: Ladene Artist , MD Age: 57 Referring MD:  Date of Birth: 1960-09-26 Gender: Male Account #: [id number] Procedure:                Upper GI endoscopy Indications:              Screening procedure. Cirrhosis - screen for varices. Medicines:                Monitored Anesthesia Care Procedure:                Pre-Anesthesia Assessment:                           - Prior to the procedure, a History and Physical                            was performed, and patient medications and                            allergies were reviewed. The patient's tolerance of                            previous anesthesia was also reviewed. The risks                            and benefits of the procedure and the sedation                            options and risks were discussed with the patient.                            All questions were answered, and informed consent                            was obtained. Prior Anticoagulants: The patient has                            taken no previous anticoagulant or antiplatelet                            agents. ASA Grade Assessment: III - A patient with                            severe systemic disease. After reviewing the risks                            and benefits, the patient was deemed in                            satisfactory condition to undergo the procedure.                           After obtaining informed consent, the endoscope was  passed under direct vision. Throughout the                            procedure, the patient's blood pressure, pulse, and                            oxygen saturations were monitored continuously. The                            Model GIF-HQ190 [id number]) scope was introduced                            through the mouth, and advanced to the second part   of duodenum. The upper GI endoscopy was                            accomplished without difficulty. The patient                            tolerated the procedure well. Scope In: Scope Out: Findings:                 The examined esophagus was normal. No varices noted.                           Patchy mildly erythematous mucosa without bleeding                            was found in the gastric body and in the prepyloric                            region of the stomach. Biopsies were taken with a                            cold forceps for histology.                           A medium amount of food (residue) was found in the                            gastric fundus, in the gastric body and in the                            gastric antrum. This obscured and limited portions                            of the exam of the stomach. No gastric cardia                            varices noted.                           The exam of the stomach was otherwise normal.  The duodenal bulb and second portion of the                            duodenum were normal. Complications:            No immediate complications. Estimated Blood Loss:     Estimated blood loss was minimal. Impression:               - Normal esophagus.                           - Erythematous mucosa in the gastric body and                            prepyloric region of the stomach. Biopsied.                           - A medium amount of food (residue) in the stomach                            that limited the exam.                           - Normal duodenal bulb and second portion of the                            duodenum. Recommendation:           - Patient has a contact number available for                            emergencies. The signs and symptoms of potential                            delayed complications were discussed with the                            patient. Return to normal activities  tomorrow.                            Written discharge instructions were provided to the                            patient.                           - Resume previous diet.                           - Continue present medications.                           - Await pathology results.                           - Repeat upper endoscopy in 3 years for screening  purposes. Ladene Artist, MD 10/24/2017 10:52:30 AM This report has been signed electronically.

## 2017-10-24 NOTE — Progress Notes (Signed)
Called to room to assist during endoscopic procedure.  Patient ID and intended procedure confirmed with present staff. Received instructions for my participation in the procedure from the performing physician.  

## 2017-10-24 NOTE — Patient Instructions (Signed)
YOU HAD AN ENDOSCOPIC PROCEDURE TODAY AT [facility]:   Refer to the procedure report that was given to you for any specific questions about what was found during the examination.  If the procedure report does not answer your questions, please call your gastroenterologist to clarify.  If you requested that your care partner not be given the details of your procedure findings, then the procedure report has been included in a sealed envelope for you to review at your convenience later.  YOU SHOULD EXPECT: Some feelings of bloating in the abdomen. Passage of more gas than usual.  Walking can help get rid of the air that was put into your GI tract during the procedure and reduce the bloating. If you had a lower endoscopy (such as a colonoscopy or flexible sigmoidoscopy) you may notice spotting of blood in your stool or on the toilet paper. If you underwent a bowel prep for your procedure, you may not have a normal bowel movement for a few days.  Please Note:  You might notice some irritation and congestion in your nose or some drainage.  This is from the oxygen used during your procedure.  There is no need for concern and it should clear up in a day or so.  SYMPTOMS TO REPORT IMMEDIATELY:    Following upper endoscopy (EGD)  Vomiting of blood or coffee ground material  New chest pain or pain under the shoulder blades  Painful or persistently difficult swallowing  New shortness of breath  Fever of 100F or higher  Black, tarry-looking stools  For urgent or emergent issues, a gastroenterologist can be reached at any hour by calling [phone].   DIET:  We do recommend a small meal at first, but then you may proceed to your regular diet.  Drink plenty of fluids but you should avoid alcoholic beverages for 24 hours.  MEDICATIONS: Continue present medications.  ACTIVITY:  You should plan to take it easy for the rest of today and you should NOT DRIVE or use heavy machinery until  tomorrow (because of the sedation medicines used during the test).    FOLLOW UP: Our staff will call the number listed on your records the next business day following your procedure to check on you and address any questions or concerns that you may have regarding the information given to you following your procedure. If we do not reach you, we will leave a message.  However, if you are feeling well and you are not experiencing any problems, there is no need to return our call.  We will assume that you have returned to your regular daily activities without incident.  If any biopsies were taken you will be contacted by phone or by letter within the next 1-3 weeks.  Please call us at [phone] if you have not heard about the biopsies in 3 weeks.   Thank you for allowing [place] to provide for your healthcare needs today.   SIGNATURES/CONFIDENTIALITY: You and/or your care partner have signed paperwork which will be entered into your electronic medical record.  These signatures attest to the fact that that the information above on your After Visit Summary has been reviewed and is understood.  Full responsibility of the confidentiality of this discharge information lies with you and/or your care-partner.

## 2017-10-24 NOTE — Progress Notes (Signed)
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  He has staples to his scalp intact.  He states that this is not new for him- he has these about 4-5 times a month.  He takes Lamictal daily for this.  He states he doesn't have convulsions with his seizures; he just "gets spacey..... And then I wake up."  Dr. Fuller Plan and Waynard Edwards CRNA made aware and are ok to continue with EGD.   Spoke with wife via phone to clarify medications.

## 2017-10-24 NOTE — Progress Notes (Signed)
Reviewed and agree with initial management plan.  Rachelle Edwards T. Kadon Andrus, MD FACG 

## 2017-10-25 ENCOUNTER — Other Ambulatory Visit: Payer: Self-pay | Admitting: Gastroenterology

## 2017-10-27 ENCOUNTER — Other Ambulatory Visit: Payer: Self-pay

## 2017-10-27 ENCOUNTER — Encounter: Payer: Self-pay | Admitting: Family Medicine

## 2017-10-27 ENCOUNTER — Ambulatory Visit (INDEPENDENT_AMBULATORY_CARE_PROVIDER_SITE_OTHER): Payer: 59 | Admitting: Family Medicine

## 2017-10-27 ENCOUNTER — Telehealth: Payer: Self-pay

## 2017-10-27 ENCOUNTER — Ambulatory Visit (INDEPENDENT_AMBULATORY_CARE_PROVIDER_SITE_OTHER): Payer: 59 | Admitting: Neurology

## 2017-10-27 VITALS — BP 108/68 | HR 73 | Temp 98.0°F | Resp 16 | Wt 195.0 lb

## 2017-10-27 DIAGNOSIS — F321 Major depressive disorder, single episode, moderate: Secondary | ICD-10-CM | POA: Diagnosis not present

## 2017-10-27 DIAGNOSIS — E11621 Type 2 diabetes mellitus with foot ulcer: Secondary | ICD-10-CM

## 2017-10-27 DIAGNOSIS — Z9989 Dependence on other enabling machines and devices: Principal | ICD-10-CM

## 2017-10-27 DIAGNOSIS — B9561 Methicillin susceptible Staphylococcus aureus infection as the cause of diseases classified elsewhere: Secondary | ICD-10-CM

## 2017-10-27 DIAGNOSIS — K5080 Crohn's disease of both small and large intestine without complications: Secondary | ICD-10-CM

## 2017-10-27 DIAGNOSIS — G4733 Obstructive sleep apnea (adult) (pediatric): Secondary | ICD-10-CM

## 2017-10-27 DIAGNOSIS — I10 Essential (primary) hypertension: Secondary | ICD-10-CM

## 2017-10-27 DIAGNOSIS — E7211 Homocystinuria: Secondary | ICD-10-CM

## 2017-10-27 DIAGNOSIS — R569 Unspecified convulsions: Secondary | ICD-10-CM | POA: Diagnosis not present

## 2017-10-27 DIAGNOSIS — L97509 Non-pressure chronic ulcer of other part of unspecified foot with unspecified severity: Secondary | ICD-10-CM

## 2017-10-27 DIAGNOSIS — E876 Hypokalemia: Secondary | ICD-10-CM

## 2017-10-27 DIAGNOSIS — R7881 Bacteremia: Secondary | ICD-10-CM | POA: Diagnosis not present

## 2017-10-27 DIAGNOSIS — R296 Repeated falls: Secondary | ICD-10-CM

## 2017-10-27 DIAGNOSIS — R740 Nonspecific elevation of levels of transaminase and lactic acid dehydrogenase [LDH]: Secondary | ICD-10-CM

## 2017-10-27 DIAGNOSIS — R413 Other amnesia: Secondary | ICD-10-CM

## 2017-10-27 DIAGNOSIS — R7402 Elevation of levels of lactic acid dehydrogenase (LDH): Secondary | ICD-10-CM

## 2017-10-27 DIAGNOSIS — E7800 Pure hypercholesterolemia, unspecified: Secondary | ICD-10-CM | POA: Diagnosis not present

## 2017-10-27 DIAGNOSIS — R7989 Other specified abnormal findings of blood chemistry: Secondary | ICD-10-CM

## 2017-10-27 LAB — POCT URINALYSIS DIP (MANUAL ENTRY)
BILIRUBIN UA: NEGATIVE
GLUCOSE UA: NEGATIVE mg/dL
Ketones, POC UA: NEGATIVE mg/dL
NITRITE UA: NEGATIVE
Protein Ur, POC: NEGATIVE mg/dL
RBC UA: NEGATIVE
Spec Grav, UA: 1.015 (ref 1.010–1.025)
Urobilinogen, UA: 4 E.U./dL — AB
pH, UA: 7 (ref 5.0–8.0)

## 2017-10-27 LAB — POCT GLYCOSYLATED HEMOGLOBIN (HGB A1C): HEMOGLOBIN A1C: 4.7

## 2017-10-27 LAB — GLUCOSE, POCT (MANUAL RESULT ENTRY): POC Glucose: 98 mg/dl (ref 70–99)

## 2017-10-27 MED ORDER — FOLIC ACID 1 MG PO TABS: 2.0000 mg | ORAL_TABLET | Freq: Every day | ORAL | 3 refills | Status: DC

## 2017-10-27 MED ORDER — POTASSIUM CHLORIDE CRYS ER 20 MEQ PO TBCR: 20.0000 meq | EXTENDED_RELEASE_TABLET | Freq: Two times a day (BID) | ORAL | 1 refills | Status: DC

## 2017-10-27 NOTE — Patient Instructions (Addendum)
  STOP DILTIAZEM.  CHECK BLOOD PRESSURE ONCE WEEKLY.   IF you received an x-ray today, you will receive an invoice from [facility]. Please contact [facility] at [phone] with questions or concerns regarding your invoice.   IF you received labwork today, you will receive an invoice from [facility]. Please contact [facility] at [phone] with questions or concerns regarding your invoice.   Our billing staff will not be able to assist you with questions regarding bills from these companies.  You will be contacted with the lab results as soon as they are available. The fastest way to get your results is to activate your My Chart account. Instructions are located on the last page of this paperwork. If you have not heard from [place] regarding the results in 2 weeks, please contact this office.

## 2017-10-27 NOTE — Progress Notes (Signed)
Subjective:    Patient ID: [name] [name], male    DOB: 1961-03-15, 57 y.o.   MRN: [id number]  10/27/2017  Chronic Conditions (3 month follow-up )    HPI This 57 y.o. male presents for THREE MONTH FOLLOW-UP of DMII, hearing loss, HTN, memory loss, recurrent falls.    RIGHT clavicular fracture: was to undergo surgery due to avulsion of clavicle; decided against surgery; arm continues to hurt.[place].   Hearing loss: has hearing aides now.  Head laceration: fell off the bed and hit the night stand; staples to scalp; ED visit; CT head.  Cirrhosis with elevated ammonia: after taking Xifaxin and Lactulose for two weeks, ammonia increased 25 points.  GI feels that ammonia level not cause of memory.  S/p EGD without evidence of esophageal varices.  Hearing for disability went well but has not heard back yet. Decreased lactulose to once per day due to six stools per day.  Possibility that could stop Lactulose but must always continue Xifaxin.     He has not really recovered from staph infection in blood stream; cold intolerant; cannot walk nearly like before admission.  Hospitalization was start of major set back.  Recommended avoiding acute illnesses which can lead to decompensation.  Wife hoping this is not new baseline.  Falling: scares wife; some days he does well; some days wife is afraid to leave him alone.  Had to escort out of [facility] office in March 2019.  Fuller Plan asked if patient drank; suggested that may need to start.    Anxiety/depression: taking Lunesta for insomnia; Zoloft 237m daily; Trazodone 209mcontinues to be increased. Upcoming appointment next week.   Some mornings, patient is a complete zombie.  Wife leaves for work; constantly fighting sleep.  Lamictal can increase to bid;stopped taking Advil pm. Sleeping pretty well most nights.   Doing at home sleep study at night; CPAP not fitting well.    BP Readings from Last 3 Encounters:  01/05/18 110/70  12/17/17  120/70  12/16/17 128/68   Wt Readings from Last 3 Encounters:  01/05/18 195 lb (88.5 kg)  12/17/17 196 lb (88.9 kg)  12/16/17 196 lb (88.9 kg)   Immunization History  Administered Date(s) Administered  . Influenza Split 03/24/2013, 03/24/2014, 03/26/2016  . Influenza Whole 03/24/2012  . Influenza,inj,Quad PF,6+ Mos 02/16/2015, 04/15/2017  . Influenza-Unspecified 04/15/2017  . Pneumococcal Conjugate-13 07/07/2014  . Pneumococcal Polysaccharide-23 06/08/2007, 06/23/2017  . Tdap 05/15/2016  . Zoster 08/18/2014    Review of Systems  Constitutional: Negative for activity change, appetite change, chills, diaphoresis, fatigue, fever and unexpected weight change.  HENT: Positive for hearing loss. Negative for congestion, dental problem, drooling, ear discharge, ear pain, facial swelling, mouth sores, nosebleeds, postnasal drip, rhinorrhea, sinus pressure, sneezing, sore throat, tinnitus, trouble swallowing and voice change.   Eyes: Negative for photophobia, pain, discharge, redness, itching and visual disturbance.  Respiratory: Negative for apnea, cough, choking, chest tightness, shortness of breath, wheezing and stridor.   Cardiovascular: Negative for chest pain, palpitations and leg swelling.  Gastrointestinal: Positive for diarrhea. Negative for abdominal distention, abdominal pain, anal bleeding, blood in stool, constipation, nausea, rectal pain and vomiting.  Endocrine: Negative for cold intolerance, heat intolerance, polydipsia, polyphagia and polyuria.  Genitourinary: Negative for decreased urine volume, difficulty urinating, discharge, dysuria, enuresis, flank pain, frequency, genital sores, hematuria, penile pain, penile swelling, scrotal swelling, testicular pain and urgency.  Musculoskeletal: Positive for arthralgias and gait problem. Negative for back pain, joint swelling, myalgias, neck pain and neck  stiffness.  Skin: Negative for color change, pallor, rash and wound.    Allergic/Immunologic: Negative for environmental allergies, food allergies and immunocompromised state.  Neurological: Positive for dizziness. Negative for tremors, seizures, syncope, facial asymmetry, speech difficulty, weakness, light-headedness, numbness and headaches.  Hematological: Negative for adenopathy. Does not bruise/bleed easily.  Psychiatric/Behavioral: Positive for confusion, decreased concentration, dysphoric mood and sleep disturbance. Negative for agitation, behavioral problems, hallucinations, self-injury and suicidal ideas. The patient is not nervous/anxious and is not hyperactive.     Past Medical History:  Diagnosis Date  . Adenomatous colon polyp 10/1999  . Anxiety   . Arthritis    neck, yoga helps.  . Cancer ([place])    skin  . Cataract   . Complex partial seizure ([place])   . Crohn's disease of small and large intestines ([place]) 1999  . Depression   . Diabetes mellitus    no meds at this time 10-24-17  . Esophageal stricture   . External hemorrhoids   . GERD (gastroesophageal reflux disease)   . History of kidney stones    3 large stones still present  . Hyperlipemia   . Hypertension   . Migraines   . MRSA (methicillin resistant Staphylococcus aureus) 10/2010   . Osteoporosis   . PONV (postoperative nausea and vomiting)   . PTSD (post-traumatic stress disorder)   . Renal cyst, acquired, left 07/08/2017  . Renal lesion 05/28/2017  . Seizures ([place])   . Skin cancer    squamous cell multiple; [place]; followed every 3 months.  . Sleep apnea    CPAP machine, uses nightly  . Staphylococcus aureus bacteremia with sepsis ([place]) 05/28/2017   Past Surgical History:  Procedure Laterality Date  . CATARACT EXTRACTION Bilateral   . COLONOSCOPY    . ELBOW SURGERY  2012   elbow MRSA infection   . EYE SURGERY     cataracts removed, /w IOL  . INSERTION OF MESH N/A 07/01/2016   Procedure: INSERTION OF MESH;  Surgeon: [name], MD;  Location: [facility];  Service: General;   Laterality: N/A;  . SCALP LACERATION REPAIR Right 10/16/2017   From fall/staples  . SHOULDER SURGERY    . SINUS SURGERY WITH INSTATRAK    . TEE WITHOUT CARDIOVERSION N/A 05/09/2017   Procedure: TRANSESOPHAGEAL ECHOCARDIOGRAM (TEE);  Surgeon: [name], MD;  Location[facility] ENDOSCOPY;  Service: Cardiovascular;  Laterality: N/A;  . TRANSTHORACIC ECHOCARDIOGRAM  02/22/2011   EF 55-65%; increased pattern of LVH with mild conc hypertrophy, abnormal relaxation & increased filling pressure (grade 2 diastolic dysfunction); atrial septum thickened (lipomatous hypertrophy)  . UMBILICAL HERNIA REPAIR N/A 07/01/2016   Procedure: UMBILICAL HERNIA REPAIR WITH MESH;  Surgeon: [name], MD;  Location: [facility];  Service: General;  Laterality: N/A;  . UPPER GASTROINTESTINAL ENDOSCOPY    . VASECTOMY     Allergies  Allergen Reactions  . Azithromycin Swelling    SWELLING REACTION UNSPECIFIED   . Beef-Derived Products Diarrhea    RED MEAT > UNSPECIFIED REACTION   . Milk-Related Compounds Diarrhea    DAIRY >UNSPECIFIED REACTION   . Penicillins Hives    Rash as child- tolerates Ancef/CHILDHOOD ALLERGY Has patient had a PCN reaction causing immediate rash, facial/tongue/throat swelling, SOB or lightheadedness with hypotension: YES Has patient had a PCN reaction causing severe rash involving mucus membranes or skin necrosis: Unknown Has patient had a PCN reaction that required hospitalization: Unknown Has patient had a PCN reaction occurring within the last 10 years:Unknown If all of the above  answers are "NO", then may proceed with Cephalosporin [place]  . Sulfonamide Derivatives Hives  . Dilaudid [Hydromorphone Hcl] Nausea And Vomiting   Current Outpatient Medications on File Prior to Visit  Medication Sig Dispense Refill  . Ascorbic Acid (VITAMIN C PO) Take 1 tablet at bedtime by mouth.    Marland Kitchen aspirin EC 81 MG tablet Take 81 mg at bedtime by mouth.     . Calcium Carb-Cholecalciferol (CALCIUM-VITAMIN D)  500-200 MG-UNIT tablet Take 1 tablet 2 (two) times daily by mouth.    . cholecalciferol (VITAMIN D) 1000 units tablet Take 1,000 Units at bedtime by mouth.     . Cyanocobalamin (VITAMIN B 12 PO) Take 1 tablet by mouth daily.    Marland Kitchen esomeprazole (NEXIUM) 40 MG capsule TAKE 1 CAPSULE TWICE A DAY.MAKE AN APPOINTMENT FOR    FURTHER REFILLS 180 capsule 0  . Eszopiclone 3 MG TABS Take 1 tablet (3 mg total) by mouth at bedtime. Take immediately before bedtime 30 tablet 1  . ferrous sulfate 325 (65 FE) MG tablet Take 325 mg at bedtime by mouth.     Marland Kitchen glucose blood (CHOICE DM FORA G20 TEST STRIPS) test strip Use as instructed 100 each 12  . Lactase (DAIRY-RELIEF PO) Take 2 tablets by mouth 2 (two) times daily.     . Lactulose 20 GM/30ML SOLN Take 30 mLs (20 g total) by mouth 2 (two) times daily. (Patient taking differently: Take 30 mLs by mouth once. ) 1800 mL 3  . lamoTRIgine (LAMICTAL) 150 MG tablet Take 1 tablet (150 mg total) by mouth daily. (Patient taking differently: Take 150 mg by mouth 2 (two) times daily. ) 180 tablet 3  . mercaptopurine (PURINETHOL) 50 MG tablet FOR DIRECTIONS ON HOW TO   TAKE THIS MEDICINE, READ   THE ENCLOSED MEDICATION    INFORMATION FORM (Patient taking differently: Take 100 mg by mouth daily. FOR DIRECTIONS ON HOW TO   TAKE THIS MEDICINE, READ   THE ENCLOSED MEDICATION    INFORMATION FORM) 180 tablet 1  . mesalamine (LIALDA) 1.2 g EC tablet Take 2 tablets (2.4 g total) by mouth daily. 180 tablet 1  . methscopolamine (PAMINE FORTE) 5 MG tablet Take 1 tablet (5 mg total) by mouth 2 (two) times daily. 180 tablet 3  . ONE TOUCH ULTRA TEST test strip USE AS INSTRUCTED 100 each 2  . Probiotic Product (PROBIOTIC PO) Take 1 tablet at bedtime by mouth.     . rifaximin (XIFAXAN) 550 MG TABS tablet Take 1 tablet (550 mg total) by mouth 2 (two) times daily. 180 tablet 3  . sertraline (ZOLOFT) 100 MG tablet Take 200 mg daily with breakfast by mouth.      Current Facility-Administered  Medications on File Prior to Visit  Medication Dose [name] Frequency Provider Last Rate Last Dose  . gadopentetate dimeglumine (MAGNEVIST) injection 20 mL  20 mL Intravenous Once PRN Marcial Pacas, MD       Social History   Socioeconomic History  . Marital status: Married    Spouse name: [name]  . Number of children: 2  . Years of education: college  . Highest education level: Not on file  Occupational History  . Occupation: disabled    Comment: seizure disorder with memory impairment  Social Needs  . Financial resource strain: Not on file  . Food insecurity:    Worry: Not on file    Inability: Not on file  . Transportation needs:    Medical: Not on file  Non-medical: Not on file  Tobacco Use  . Smoking status: Never Smoker  . Smokeless tobacco: Never Used  Substance and Sexual Activity  . Alcohol use: No  . Drug use: No  . Sexual activity: Yes    Birth control/protection: Surgical    Comment: 1 partner in last 12 months  Lifestyle  . Physical activity:    Days per week: Not on file    Minutes per session: Not on file  . Stress: Not on file  Relationships  . Social connections:    Talks on phone: Not on file    Gets together: Not on file    Attends religious service: Not on file    Active member of club or organization: Not on file    Attends meetings of clubs or organizations: Not on file    Relationship status: Not on file  . Intimate partner violence:    Fear of current or ex partner: Not on file    Emotionally abused: Not on file    Physically abused: Not on file    Forced sexual activity: Not on file  Other Topics Concern  . Not on file  Social History Narrative   Marital status: married x 32 years;       Lives:   lives at home with his family.      Children:  two children; no grandchildren.  Son is special needs at age 27yo; Cornelian Delaine with TOF.       Employment: unemployed; [employer] long term disability      Tobacco: none      Alcohol: none        Drugs: none      Exercise: unable to exercise due to frequent falls   Patient has a college education.   Patient is right-handed.   Patient drinks one cup of soda daily.   Family History  Problem Relation Age of Onset  . Colon polyps Father   . Stroke Father   . Hyperlipidemia Father   . Hypertension Father   . Heart disease Mother        CABG at age 81  . Hyperlipidemia Mother   . Hypertension Mother   . Stroke Paternal Grandmother   . Stroke Paternal Grandfather   . Other Child        tetrology of fallot (cornealia deland syndrome)  . Colon cancer Neg Hx   . Esophageal cancer Neg Hx   . Stomach cancer Neg Hx   . Rectal cancer Neg Hx        Objective:    BP 108/68   Pulse 73   Temp 98 F (36.7 C) (Oral)   Resp 16   Wt 195 lb (88.5 kg)   SpO2 98%   BMI 27.20 kg/m  Physical Exam  Constitutional: He is oriented to person, place, and time. He appears well-developed and well-nourished. No distress.  HENT:  Head: Normocephalic and atraumatic.  Right Ear: External ear normal.  Left Ear: External ear normal.  Nose: Nose normal.  Mouth/Throat: Oropharynx is clear and moist.  Eyes: Pupils are equal, round, and reactive to light. Conjunctivae and EOM are normal.  Neck: Normal range of motion. Neck supple. Carotid bruit is not present. No thyromegaly present.  Cardiovascular: Normal rate, regular rhythm, normal heart sounds and intact distal pulses. Exam reveals no gallop and no friction rub.  No murmur heard. Pulmonary/Chest: Effort normal and breath sounds normal. He has no wheezes. He has no rales.  Abdominal: Soft. Bowel sounds are normal. He exhibits no distension and no mass. There is no tenderness. There is no rebound and no guarding.  Musculoskeletal:       Right shoulder: Normal.       Left shoulder: Normal.       Cervical back: Normal.  Lymphadenopathy:    He has no cervical adenopathy.  Neurological: He is alert and oriented to person, place, and time.  He has normal reflexes. No cranial nerve deficit or sensory deficit. He exhibits normal muscle tone. Coordination normal.  Skin: Skin is warm and dry. No rash noted. He is not diaphoretic.  Psychiatric: He has a normal mood and affect. His behavior is normal. Judgment and thought content normal.  Nursing note and vitals reviewed.  No results found. Depression screen [facility] 2/9 01/05/2018 12/16/2017 07/26/2017 06/04/2017 05/28/2017  Decreased Interest 0 0 1 0 0  Down, Depressed, Hopeless 0 0 3 0 0  PHQ - 2 Score 0 0 4 0 0  Altered sleeping - - 2 - -  Tired, decreased energy - - 0 - 0  Change in appetite - - 2 - 0  Feeling bad or failure about yourself  - - 3 - 0  Trouble concentrating - - 3 - 0  Moving slowly or fidgety/restless - - 3 - 0  Suicidal thoughts - - 2 - 0  PHQ-9 Score - - 19 - -  Difficult doing work/chores - - Somewhat difficult - -  Some recent data might be hidden   Fall Risk  01/05/2018 12/16/2017 10/27/2017 07/26/2017 06/04/2017  Falls in the past year? Yes No Yes Yes Yes  Number falls in past yr: 1 - 2 or more 2 or more 2 or more  Injury with Fall? No - Yes No -  Comment - - - - -  Risk Factor Category  - - High Fall Risk - -  Risk for fall due to : - - - - -        Assessment & Plan:   1. Type 2 diabetes mellitus with foot ulcer, without long-term current use of insulin ([place])   2. Memory loss   3. Seizure ([place])   4. TRANSAMINASES, SERUM, ELEVATED   5. Pure hypercholesterolemia   6. Elevated homocysteine (H[facility])   7. Hypokalemia   8. Frequent falls   9. MSSA bacteremia   10. Current moderate episode of major depressive disorder without prior episode ([place])   11. OSA on CPAP   12. Crohn's disease of both small and large intestine without complication ([place])   13. Essential hypertension     Hypertension: With recurrent borderline low blood pressure readings.  Stop diltiazem at this time.  Monitor blood pressure regularly at home.    Diabetes mellitus type 2: Controlled  with dietary modification.  Obtain labs for chronic disease management.  Obstructive sleep apnea: Scheduled for repeat sleep study as mass not fitting well.  Major depressive disorder: Followed closely by psychiatry and therapist.  Wife expresses concern regarding polypharmacy and is a significant concern.  Trazodone can cause unsteadiness and orthostatic hypotension.  Recommend discussing with psychiatrist.  Crohn's disease and cirrhosis: With elevated ammonia level.  Followed closely by gastroenterology.  Ammonia level remaining elevated despite attempts of lowering with medication.  Orders Placed This Encounter  Procedures  . Comprehensive metabolic panel    Order Specific Question:   Has the patient fasted?    Answer:   No  . CBC with Differential/Platelet  .  Microalbumin / creatinine urine ratio  . Lipid panel    Order Specific Question:   Has the patient fasted?    Answer:   No  . Ammonia  . POCT glycosylated hemoglobin (Hb A1C)  . POCT glucose (manual entry)  . POCT urinalysis dipstick   Meds ordered this encounter  Medications  . folic acid (FOLVITE) 1 MG tablet    Sig: Take 2 tablets (2 mg total) by mouth daily.    Dispense:  180 tablet    Refill:  3  . potassium chloride SA (K-DUR,KLOR-CON) 20 MEQ tablet    Sig: Take 1 tablet (20 mEq total) by mouth 2 (two) times daily.    Dispense:  180 tablet    Refill:  1    Return in about 2 months (around 12/27/2017) for follow-up chronic medical conditions.   Aryan Bello Elayne Guerin, M.D. Primary Care at [facility] [place] [address] [place], [place]  [address] [phone] phone [phone] fax

## 2017-10-27 NOTE — Telephone Encounter (Signed)
  Follow up Call-  Call back number 10/24/2017  Post procedure Call Back phone  # wife [phone]  Permission to leave phone message Yes  Some recent data might be hidden     Patient questions:  Do you have a fever, pain , or abdominal swelling? No. Pain Score  0 *  Have you tolerated food without any problems? Yes.    Have you been able to return to your normal activities? Yes.    Do you have any questions about your discharge instructions: Diet   No. Medications  No. Follow up visit  No.  Do you have questions or concerns about your Care? No.  Actions: * If pain score is 4 or above: No action needed, pain <4.  I spoke with pt's wife, [name].  No problems noted. maw

## 2017-10-28 LAB — LIPID PANEL
CHOL/HDL RATIO: 2 ratio (ref 0.0–5.0)
Cholesterol, Total: 165 mg/dL (ref 100–199)
HDL: 83 mg/dL (ref >39)
LDL Calculated: 64 mg/dL (ref 0–99)
Triglycerides: 89 mg/dL (ref 0–149)
VLDL CHOLESTEROL CAL: 18 mg/dL (ref 5–40)

## 2017-10-28 LAB — COMPREHENSIVE METABOLIC PANEL
A/G RATIO: 2.1 (ref 1.2–2.2)
ALT: 18 IU/L (ref 0–44)
AST: 27 IU/L (ref 0–40)
Albumin: 4.6 g/dL (ref 3.5–5.5)
Alkaline Phosphatase: 146 IU/L — ABNORMAL HIGH (ref 39–117)
BILIRUBIN TOTAL: 3.4 mg/dL — AB (ref 0.0–1.2)
BUN/Creatinine Ratio: 13 (ref 9–20)
BUN: 16 mg/dL (ref 6–24)
CO2: 23 mmol/L (ref 20–29)
Calcium: 9.3 mg/dL (ref 8.7–10.2)
Chloride: 106 mmol/L (ref 96–106)
Creatinine, Ser: 1.24 mg/dL (ref 0.76–1.27)
GFR calc Af Amer: 75 mL/min/{1.73_m2} (ref >59)
GFR calc non Af Amer: 65 mL/min/{1.73_m2} (ref >59)
Globulin, Total: 2.2 g/dL (ref 1.5–4.5)
Glucose: 98 mg/dL (ref 65–99)
POTASSIUM: 4.1 mmol/L (ref 3.5–5.2)
Sodium: 144 mmol/L (ref 134–144)
Total Protein: 6.8 g/dL (ref 6.0–8.5)

## 2017-10-28 LAB — CBC WITH DIFFERENTIAL/PLATELET
Basophils Absolute: 0 10*3/uL (ref 0.0–0.2)
Basos: 1 %
EOS (ABSOLUTE): 0.2 10*3/uL (ref 0.0–0.4)
Eos: 4 %
Hematocrit: 32.7 % — ABNORMAL LOW (ref 37.5–51.0)
Hemoglobin: 11.6 g/dL — ABNORMAL LOW (ref 13.0–17.7)
IMMATURE GRANS (ABS): 0 10*3/uL (ref 0.0–0.1)
IMMATURE GRANULOCYTES: 0 %
LYMPHS: 15 %
Lymphocytes Absolute: 0.7 10*3/uL (ref 0.7–3.1)
MCH: 38.2 pg — AB (ref 26.6–33.0)
MCHC: 35.5 g/dL (ref 31.5–35.7)
MCV: 108 fL — ABNORMAL HIGH (ref 79–97)
Monocytes Absolute: 0.3 10*3/uL (ref 0.1–0.9)
Monocytes: 6 %
NEUTROS ABS: 3.6 10*3/uL (ref 1.4–7.0)
NEUTROS PCT: 74 %
PLATELETS: 127 10*3/uL — AB (ref 150–379)
RBC: 3.04 x10E6/uL — ABNORMAL LOW (ref 4.14–5.80)
RDW: 16.2 % — AB (ref 12.3–15.4)
WBC: 4.8 10*3/uL (ref 3.4–10.8)

## 2017-10-28 LAB — AMMONIA: AMMONIA: 119 ug/dL — AB (ref 27–102)

## 2017-10-28 LAB — MICROALBUMIN / CREATININE URINE RATIO
Creatinine, Urine: 102.8 mg/dL
MICROALB/CREAT RATIO: 22.5 mg/g{creat} (ref 0.0–30.0)
Microalbumin, Urine: 23.1 ug/mL

## 2017-11-01 ENCOUNTER — Encounter: Payer: Self-pay | Admitting: Family Medicine

## 2017-11-04 ENCOUNTER — Encounter: Payer: Self-pay | Admitting: Gastroenterology

## 2017-11-04 ENCOUNTER — Encounter: Payer: 59 | Admitting: Gastroenterology

## 2017-11-07 ENCOUNTER — Telehealth: Payer: Self-pay | Admitting: Neurology

## 2017-11-07 DIAGNOSIS — Z9989 Dependence on other enabling machines and devices: Principal | ICD-10-CM

## 2017-11-07 DIAGNOSIS — F445 Conversion disorder with seizures or convulsions: Secondary | ICD-10-CM

## 2017-11-07 DIAGNOSIS — G4733 Obstructive sleep apnea (adult) (pediatric): Secondary | ICD-10-CM

## 2017-11-07 NOTE — Telephone Encounter (Signed)
[facility] [place], [place] [address] NAME:  [name] [name]                                                              DOB: 11/06/1960 MEDICAL RECORD NUMBER  [id number]                                          DOS:  10/30/2017 REFERRING PHYSICIAN: Cecille Rubin, N.P. STUDY PERFORMED: Home Sleep Study on Apnea Link HISTORY:  [name] is a 57 years old right-handed male who was referred by his primary care physician, Dr. [name], for evaluation of seizure. Last hospitalization in Nov 2018 for spells.  He has diabetes, hypertension, Crohn's disease, on immunosuppressive treatment, He presented with seizure-like event, had multiple recurrent confusion episodes later, and finally referred for non -epileptic seizures, anxiety, and depression. Decreasing cognitive function  He was also diagnosed with severe obstructive sleep apnea, using a by now 17 or 56 year old machine CPAP machine. I have no access to original study, but he needs a new machine.  BMI: 28.1, Epworth score not endorsed.   STUDY RESULTS:  Total Recording Time: 7 hours; 16 minutes - valid test time 6h and 11 min.  Total Apnea/Hypopnea Index (AHI): 32.0 /h; RDI: 34.2 /h. Average Oxygen Saturation:  91 %; Lowest Oxygen Saturation: 81 %.  Total Time in Oxygen Saturation below 89 %: 32.0 minutes.  Average Heart Rate: 76 bpm (between 67 and 92 bpm). IMPRESSION: 1) Moderate- Severe Obstructive Sleep Apnea. 2) Prolonged Sleep Hypoxemia. RECOMMENDATION: OSA at this degree and when associated with hypoxia is not likely to improve with a dental device and needs PAP therapy. The new CPAP will be provided as auto-titration capable, pressure window of 6-16 cm water, with 3 cm EPR, and mask of choice and comfort. Heated humidity.   I certify that I have reviewed the raw data recording prior to the issuance of this report in accordance with the standards of the American Academy of Sleep Medicine  (AASM). Larey Seat, M.D.    11-07-2016      Medical Director of [place] [facility] at [facility], accredited by the AASM. Diplomat of the ABPN and ABSM.

## 2017-11-07 NOTE — Procedures (Signed)
[facility] [place], [place] [address] NAME:  [name] [name]                                                              DOB: 08/03/1960 MEDICAL RECORD NUMBER  [id number]                                          DOS:  10/30/2017 REFERRING PHYSICIAN: Cecille Rubin, N.P. STUDY PERFORMED: Home Sleep Study on Apnea Link HISTORY:  [name] is a 57 years old right-handed male who was referred by his primary care physician, Dr. [name], for evaluation of seizure. Last hospitalization in Nov 2018 for spells.  He has diabetes, hypertension, Crohn's disease, on immunosuppressive treatment, He presented with seizure-like event, had multiple recurrent confusion episodes later, and finally referred for non -epileptic seizures, anxiety, and depression. Decreasing cognitive function  He was also diagnosed with severe obstructive sleep apnea, using a by now 41 or 56 year old machine CPAP machine. I have no access to original study, but he needs a new machine.  BMI: 28.1, Epworth score not endorsed.   STUDY RESULTS:  Total Recording Time: 7 hours; 16 minutes - valid test time 6h and 11 min.  Total Apnea/Hypopnea Index (AHI): 32.0 /h; RDI: 34.2 /h. Average Oxygen Saturation:  91 %; Lowest Oxygen Saturation: 81 %.  Total Time in Oxygen Saturation below 89 %: 32.0 minutes.  Average Heart Rate: 76 bpm (between 67 and 92 bpm). IMPRESSION: 1) Moderate- Severe Obstructive Sleep Apnea. 2) Prolonged Sleep Hypoxemia. RECOMMENDATION: OSA at this degree and when associated with hypoxia is not likely to improve with a dental device and needs PAP therapy. The new CPAP will be provided as auto-titration capable, pressure window of 6-16 cm water, with 3 cm EPR, and mask of choice and comfort. Heated humidity.   I certify that I have reviewed the raw data recording prior to the issuance of this report in accordance with the standards of the American Academy of Sleep Medicine  (AASM). Larey Seat, M.D.    11-07-2016      Medical Director of [place] [facility] at [facility], accredited by the AASM. Diplomat of the ABPN and ABSM.

## 2017-11-10 NOTE — Addendum Note (Signed)
Addended by: Larey Seat on: 11/10/2017 11:36 AM   Modules accepted: Orders

## 2017-11-10 NOTE — Telephone Encounter (Signed)
I called pt. I advised pt that Dr. Brett Fairy reviewed their sleep study results and found that pt has sleep apnea. Dr. Brett Fairy recommends that pt starts a auto CPAP 6-16 cm of water pressure. I reviewed PAP compliance expectations with the pt. Pt is agreeable to starting a CPAP. I advised pt that an order will be sent to a DME, [facility], and [facility] will call the pt within about one week after they file with the pt's insurance. [facility] will show the pt how to use the machine, fit for masks, and troubleshoot the CPAP if needed. A follow up appt was made for insurance purposes with Dr. Brett Fairy on Feb 18, 2018 at 11:00 am. Pt verbalized understanding to arrive 15 minutes early and bring their CPAP. A letter with all of this information in it will be mailed to the pt as a reminder. I verified with the pt that the address we have on file is correct. Pt verbalized understanding of results. Pt had no questions at this time but was encouraged to call back if questions arise.

## 2017-11-11 DIAGNOSIS — G4733 Obstructive sleep apnea (adult) (pediatric): Secondary | ICD-10-CM | POA: Diagnosis not present

## 2017-11-17 ENCOUNTER — Encounter: Payer: Self-pay | Admitting: Family Medicine

## 2017-11-19 DIAGNOSIS — M25511 Pain in right shoulder: Secondary | ICD-10-CM | POA: Diagnosis not present

## 2017-11-21 ENCOUNTER — Encounter: Payer: Self-pay | Admitting: Family Medicine

## 2017-11-23 ENCOUNTER — Other Ambulatory Visit: Payer: Self-pay

## 2017-11-23 ENCOUNTER — Emergency Department (HOSPITAL_BASED_OUTPATIENT_CLINIC_OR_DEPARTMENT_OTHER): Payer: 59

## 2017-11-23 ENCOUNTER — Encounter (HOSPITAL_BASED_OUTPATIENT_CLINIC_OR_DEPARTMENT_OTHER): Payer: Self-pay | Admitting: Emergency Medicine

## 2017-11-23 ENCOUNTER — Emergency Department (HOSPITAL_BASED_OUTPATIENT_CLINIC_OR_DEPARTMENT_OTHER)
Admission: EM | Admit: 2017-11-23 | Discharge: 2017-11-23 | Disposition: A | Discharge status: Home / Self Care | Payer: 59 | Attending: Physician Assistant | Admitting: Physician Assistant

## 2017-11-23 DIAGNOSIS — Z85828 Personal history of other malignant neoplasm of skin: Secondary | ICD-10-CM | POA: Diagnosis not present

## 2017-11-23 DIAGNOSIS — S42111A Displaced fracture of body of scapula, right shoulder, initial encounter for closed fracture: Secondary | ICD-10-CM | POA: Diagnosis not present

## 2017-11-23 DIAGNOSIS — W19XXXD Unspecified fall, subsequent encounter: Secondary | ICD-10-CM | POA: Insufficient documentation

## 2017-11-23 DIAGNOSIS — Y9389 Activity, other specified: Secondary | ICD-10-CM | POA: Insufficient documentation

## 2017-11-23 DIAGNOSIS — Y929 Unspecified place or not applicable: Secondary | ICD-10-CM | POA: Insufficient documentation

## 2017-11-23 DIAGNOSIS — I10 Essential (primary) hypertension: Secondary | ICD-10-CM | POA: Diagnosis not present

## 2017-11-23 DIAGNOSIS — Y998 Other external cause status: Secondary | ICD-10-CM | POA: Insufficient documentation

## 2017-11-23 DIAGNOSIS — S42191A Fracture of other part of scapula, right shoulder, initial encounter for closed fracture: Secondary | ICD-10-CM | POA: Diagnosis not present

## 2017-11-23 DIAGNOSIS — S4991XA Unspecified injury of right shoulder and upper arm, initial encounter: Secondary | ICD-10-CM | POA: Diagnosis present

## 2017-11-23 DIAGNOSIS — E119 Type 2 diabetes mellitus without complications: Secondary | ICD-10-CM | POA: Insufficient documentation

## 2017-11-23 DIAGNOSIS — Z79899 Other long term (current) drug therapy: Secondary | ICD-10-CM | POA: Insufficient documentation

## 2017-11-23 DIAGNOSIS — Z7982 Long term (current) use of aspirin: Secondary | ICD-10-CM | POA: Diagnosis not present

## 2017-11-23 DIAGNOSIS — M549 Dorsalgia, unspecified: Secondary | ICD-10-CM | POA: Diagnosis not present

## 2017-11-23 MED ORDER — OXYCODONE HCL 5 MG PO TABS
5.0000 mg | ORAL_TABLET | Freq: Once | ORAL | Status: AC
  Administered 2017-11-23: 5 mg via ORAL
  Filled 2017-11-23: qty 1

## 2017-11-23 NOTE — ED Notes (Signed)
ED Provider at bedside. 

## 2017-11-23 NOTE — Discharge Instructions (Addendum)
You have a new scapula fracture.  Please follow-up with your orthopedist, Dr. Berenice Primas as planned.  Should use the pain medications that you have at home to help with the pain.  Use the sling for comfort and promote healing.  Please return with any concerns.

## 2017-11-23 NOTE — ED Provider Notes (Signed)
[facility] DEPARTMENT Provider Note   CSN: [id number] Arrival date & time: 11/23/17  1204     History   Chief Complaint Chief Complaint  Patient presents with  . Back Pain    HPI [name] [name] is a 57 y.o. male.  HPI    57 year old male presenting with pain to his right back/shoulder.  Patient had a fall last week and was seen by his orthopedist.  Orthopedist had a x-ray of the scapula which showed old scapular fracture alert with no new fractures.  We here got an x-ray of the whole right side which shows a comminuted scapular fracture which is new.  Think this is likely the cause of patient's right-sided pain.  Will have him follow-up with his primary orthopedist as an outpatient.  Sling given.   Of note patient has past medical history significant for cirrhosis, altered mental status, hyper ammonia multiple falls.,  Chronic fractures  Past Medical History:  Diagnosis Date  . Adenomatous colon polyp 10/1999  . Anxiety   . Arthritis    neck, yoga helps.  . Cancer ([place])    skin  . Cataract   . Complex partial seizure ([place])   . Crohn's disease of small and large intestines ([place]) 1999  . Depression   . Diabetes mellitus    no meds at this time 10-24-17  . Esophageal stricture   . External hemorrhoids   . GERD (gastroesophageal reflux disease)   . History of kidney stones    3 large stones still present  . Hyperlipemia   . Hypertension   . Migraines   . MRSA (methicillin resistant Staphylococcus aureus) 10/2010   . Osteoporosis   . PONV (postoperative nausea and vomiting)   . PTSD (post-traumatic stress disorder)   . Renal cyst, acquired, left 07/08/2017  . Renal lesion 05/28/2017  . Seizures ([place])   . Skin cancer    squamous cell multiple; [place]; followed every 3 months.  . Sleep apnea    CPAP machine, uses nightly  . Staphylococcus aureus bacteremia with sepsis ([place]) 05/28/2017    Patient Active Problem List   Diagnosis Date Noted  .  Therapeutic drug monitoring 10/07/2017  . Renal cyst, acquired, left 07/08/2017  . Frequent falls 07/08/2017  . Staphylococcus aureus bacteremia with sepsis ([place]) 05/28/2017  . Renal lesion 05/28/2017  . Bacteremia due to Staphylococcus aureus 05/06/2017  . MSSA bacteremia 05/06/2017  . Confusion 04/02/2017  . Syncope 08/30/2016  . Depression 04/30/2016  . Memory loss 04/30/2016  . OSA on CPAP 10/31/2015  . Nonepileptic episode ([place]) 04/25/2015  . Seizure ([place]) 04/04/2013  . Abnormal EKG 03/12/2013  . DOE (dyspnea on exertion) 03/12/2013  . Complex partial seizure ([place]) 04/19/2012  . DM2 (diabetes mellitus, type 2) ([place]) 09/25/2011  . Altered mental status 09/25/2011  . OTHER DYSPHAGIA 03/27/2009  . TRANSAMINASES, SERUM, ELEVATED 03/27/2009  . Cough 11/10/2008  . GERD 12/07/2007  . COLONIC POLYPS, ADENOMATOUS, HX OF 12/07/2007  . EXTERNAL HEMORRHOIDS 09/11/2007  . CROHN'S DISEASE, LARGE AND SMALL INTESTINES 09/11/2007  . OSTEOPOROSIS 09/11/2007  . HYPERLIPIDEMIA NEC/NOS 02/27/2007  . Essential hypertension 02/27/2007    Past Surgical History:  Procedure Laterality Date  . CATARACT EXTRACTION Bilateral   . COLONOSCOPY    . ELBOW SURGERY  2012   elbow MRSA infection   . EYE SURGERY     cataracts removed, /w IOL  . INSERTION OF MESH N/A 07/01/2016   Procedure: INSERTION OF MESH;  Surgeon: [name],  MD;  Location: [facility];  Service: General;  Laterality: N/A;  . SCALP LACERATION REPAIR Right 10/16/2017   From fall/staples  . SHOULDER SURGERY    . SINUS SURGERY WITH INSTATRAK    . TEE WITHOUT CARDIOVERSION N/A 05/09/2017   Procedure: TRANSESOPHAGEAL ECHOCARDIOGRAM (TEE);  Surgeon: [name], MD;  [facility];  Service: Cardiovascular;  Laterality: N/A;  . TRANSTHORACIC ECHOCARDIOGRAM  02/22/2011   EF 55-65%; increased pattern of LVH with mild conc hypertrophy, abnormal relaxation & increased filling pressure (grade 2 diastolic dysfunction); atrial septum  thickened (lipomatous hypertrophy)  . UMBILICAL HERNIA REPAIR N/A 07/01/2016   Procedure: UMBILICAL HERNIA REPAIR WITH MESH;  Surgeon: [name], MD;  Location: [facility];  Service: General;  Laterality: N/A;  . UPPER GASTROINTESTINAL ENDOSCOPY    . VASECTOMY          Home Medications    Prior to Admission medications   Medication Sig Start Date End Date Taking? Authorizing Provider  Ascorbic Acid (VITAMIN C PO) Take 1 tablet at bedtime by mouth.    [provider]  aspirin EC 81 MG tablet Take 81 mg at bedtime by mouth.     [provider]  Calcium Carb-Cholecalciferol (CALCIUM-VITAMIN D) 500-200 MG-UNIT tablet Take 1 tablet 2 (two) times daily by mouth.    [provider]  cholecalciferol (VITAMIN D) 1000 units tablet Take 1,000 Units at bedtime by mouth.     [provider]  Cyanocobalamin (VITAMIN B 12 PO) Take 1 tablet by mouth daily.    [provider]  diltiazem (DILACOR XR) 180 MG 24 hr capsule Take 180 mg by mouth daily.    [provider]  esomeprazole (NEXIUM) 40 MG capsule TAKE 1 CAPSULE TWICE A DAY.MAKE AN APPOINTMENT FOR    FURTHER REFILLS 10/27/17   Ladene Artist, MD  Eszopiclone 3 MG TABS Take 1 tablet (3 mg total) by mouth at bedtime. Take immediately before bedtime 03/21/17   Wardell Honour, MD  ferrous sulfate 325 (65 FE) MG tablet Take 325 mg at bedtime by mouth.     [provider]  folic acid (FOLVITE) 1 MG tablet Take 2 tablets (2 mg total) by mouth daily. 10/27/17   Wardell Honour, MD  glucose blood (CHOICE DM FORA G20 TEST STRIPS) test strip Use as instructed 11/13/12   [name], MD  Lactase (DAIRY-RELIEF PO) Take 2 tablets by mouth 2 (two) times daily.     [provider]  Lactulose 20 GM/30ML SOLN Take 30 mLs (20 g total) by mouth 2 (two) times daily. Patient taking differently: Take 30 mLs by mouth once.  09/15/17   Ladene Artist, MD  lamoTRIgine (LAMICTAL) 150 MG tablet Take 1  tablet (150 mg total) by mouth daily. Patient taking differently: Take 150 mg by mouth 2 (two) times daily.  10/09/17   Dennie Bible, NP  mercaptopurine (PURINETHOL) 50 MG tablet FOR DIRECTIONS ON HOW TO   TAKE THIS MEDICINE, READ   THE ENCLOSED MEDICATION    INFORMATION FORM Patient taking differently: Take 100 mg by mouth daily. FOR DIRECTIONS ON HOW TO   TAKE THIS MEDICINE, READ   THE ENCLOSED MEDICATION    INFORMATION FORM 08/27/17   Ladene Artist, MD  mesalamine (LIALDA) 1.2 g EC tablet Take 2 tablets (2.4 g total) by mouth daily. 08/27/17   Ladene Artist, MD  methscopolamine (PAMINE FORTE) 5 MG tablet Take 1 tablet (5 mg total) by mouth  2 (two) times daily. 07/21/17   Ladene Artist, MD  ONE TOUCH ULTRA TEST test strip USE AS INSTRUCTED    Weber, Damaris Hippo, PA-C  potassium chloride SA (K-DUR,KLOR-CON) 20 MEQ tablet Take 1 tablet (20 mEq total) by mouth 2 (two) times daily. 10/27/17   Wardell Honour, MD  Probiotic Product (PROBIOTIC PO) Take 1 tablet at bedtime by mouth.     [provider]  rifaximin (XIFAXAN) 550 MG TABS tablet Take 1 tablet (550 mg total) by mouth 2 (two) times daily. 08/27/17   Ladene Artist, MD  sertraline (ZOLOFT) 100 MG tablet Take 200 mg daily with breakfast by mouth.  03/16/17   [provider]  TraZODone HCl 150 MG TB24 Take by mouth. Once tablet daily    [provider]    Family History Family History  Problem Relation Age of Onset  . Colon polyps Father   . Stroke Father   . Hyperlipidemia Father   . Hypertension Father   . Heart disease Mother        CABG at age 59  . Hyperlipidemia Mother   . Hypertension Mother   . Stroke Paternal Grandmother   . Stroke Paternal Grandfather   . Other Child        tetrology of fallot (cornealia deland syndrome)  . Colon cancer Neg Hx   . Esophageal cancer Neg Hx   . Stomach cancer Neg Hx   . Rectal cancer Neg Hx     Social History Social History   Tobacco Use  . Smoking  status: Never Smoker  . Smokeless tobacco: Never Used  Substance Use Topics  . Alcohol use: No  . Drug use: No     Allergies   Azithromycin; Beef-derived products; Milk-related compounds; Penicillins; Sulfonamide derivatives; and Dilaudid [hydromorphone hcl]   Review of Systems Review of Systems  Constitutional: Negative for activity change.  Respiratory: Negative for shortness of breath.   Cardiovascular: Negative for chest pain.  Gastrointestinal: Negative for abdominal pain.  All other systems reviewed and are negative.    Physical Exam Updated Vital Signs BP 126/81 (BP Location: Left Arm)   Pulse 90   Temp 98.3 F (36.8 C) (Oral)   Resp 18   Ht 5' 11"  (1.803 m)   Wt 88.5 kg (195 lb)   SpO2 98%   BMI 27.20 kg/m   Physical Exam  Constitutional: He is oriented to person, place, and time. He appears well-nourished.  HENT:  Head: Normocephalic.  Eyes: Conjunctivae are normal. Right eye exhibits no discharge. Left eye exhibits no discharge.  Cardiovascular: Normal rate and regular rhythm.  Pulmonary/Chest: Effort normal. No respiratory distress. He has no wheezes.  Bruising over R scapula. And r arm.   Neurological: He is oriented to person, place, and time.  Skin: Skin is warm and dry. He is not diaphoretic.  Psychiatric: He has a normal mood and affect. His behavior is normal.     ED Treatments / Results  Labs (all labs ordered are listed, but only abnormal results are displayed) Labs Reviewed - No data to display  EKG None  Radiology Dg Ribs Unilateral W/chest Right  Result Date: 11/23/2017 CLINICAL DATA:  Fall on Wednesday, RIGHT-sided thoracic back pain and rib pain since yesterday. Seizure disorder. EXAM: RIGHT RIBS AND CHEST - 3+ VIEW COMPARISON:  Plain film of the chest and RIGHT ribs dated 07/29/2017. FINDINGS: Single-view of the chest and four views of the RIGHT ribs are provided. Skin  marker in place at the site of patient's pain. Heart size and  mediastinal contours are within normal limits. Lungs are clear. No pleural effusion or pneumothorax seen. There is a displaced/comminuted fracture of the RIGHT scapula, centered below the level of the glenoid fossa. There is an old fracture of the distal RIGHT clavicle. There are old healed fractures of the RIGHT seventh and eighth ribs. No acute appearing rib fracture or displacement. IMPRESSION: 1. Displaced/comminuted fracture within the RIGHT scapula. 2. No acute appearing rib fracture or displacement seen. 3. No evidence of pneumonia or pulmonary edema. Electronically Signed   By: Franki Cabot M.D.   On: 11/23/2017 12:44    Procedures Procedures (including critical care time)  Medications Ordered in ED Medications  oxyCODONE (Oxy IR/ROXICODONE) immediate release tablet 5 mg (5 mg Oral Given 11/23/17 1323)     Initial Impression / Assessment and Plan / ED Course  I have reviewed the triage vital signs and the nursing notes.  Pertinent labs & imaging results that were available during my care of the patient were reviewed by me and considered in my medical decision making (see chart for details).     57 year old male presenting with pain to his right back/shoulder.  Patient had a fall last week and was seen by his orthopedist.  Orthopedist had a x-ray of the scapula which showed old scapular fracture alert with no new fractures.  We here got an x-ray of the whole right side which shows a comminuted scapular fracture which is new.  Think this is likely the cause of patient's right-sided pain.  Will have him follow-up with his primary orthopedist as an outpatient.  Sling given.   Of note patient has past medical history significant for cirrhosis, altered mental status, hyper ammonia multiple falls.,  Chronic fractures  Final Clinical Impressions(s) / ED Diagnoses   Final diagnoses:  None    ED Discharge Orders    None       Tommy Goostree, Fredia Sorrow, MD 11/23/17 1347

## 2017-11-23 NOTE — ED Notes (Signed)
Patient transported to X-ray 

## 2017-11-23 NOTE — ED Triage Notes (Signed)
Pt fell Wednesday. Was seen and evaluated with neg xrays. Pt now c/o R thoracic back pain/rib since yesterday.

## 2017-11-25 DIAGNOSIS — M25511 Pain in right shoulder: Secondary | ICD-10-CM | POA: Diagnosis not present

## 2017-11-26 ENCOUNTER — Other Ambulatory Visit: Payer: 59

## 2017-11-26 ENCOUNTER — Other Ambulatory Visit: Payer: Self-pay | Admitting: Orthopedic Surgery

## 2017-11-26 ENCOUNTER — Ambulatory Visit
Admission: RE | Admit: 2017-11-26 | Discharge: 2017-11-26 | Disposition: A | Discharge status: Home / Self Care | Payer: 59 | Source: Ambulatory Visit | Attending: Orthopedic Surgery | Admitting: Orthopedic Surgery

## 2017-11-26 DIAGNOSIS — S42111A Displaced fracture of body of scapula, right shoulder, initial encounter for closed fracture: Secondary | ICD-10-CM | POA: Diagnosis not present

## 2017-11-26 DIAGNOSIS — M545 Low back pain, unspecified: Secondary | ICD-10-CM

## 2017-11-26 DIAGNOSIS — S42101A Fracture of unspecified part of scapula, right shoulder, initial encounter for closed fracture: Secondary | ICD-10-CM

## 2017-11-26 DIAGNOSIS — S32010A Wedge compression fracture of first lumbar vertebra, initial encounter for closed fracture: Secondary | ICD-10-CM | POA: Diagnosis not present

## 2017-11-26 DIAGNOSIS — S42001K Fracture of unspecified part of right clavicle, subsequent encounter for fracture with nonunion: Secondary | ICD-10-CM

## 2017-11-26 DIAGNOSIS — S2241XA Multiple fractures of ribs, right side, initial encounter for closed fracture: Secondary | ICD-10-CM | POA: Diagnosis not present

## 2017-11-26 DIAGNOSIS — M25511 Pain in right shoulder: Secondary | ICD-10-CM | POA: Diagnosis not present

## 2017-11-29 ENCOUNTER — Other Ambulatory Visit: Payer: Self-pay | Admitting: Orthopedic Surgery

## 2017-12-01 ENCOUNTER — Other Ambulatory Visit: Payer: Self-pay | Admitting: Orthopedic Surgery

## 2017-12-01 DIAGNOSIS — S42101A Fracture of unspecified part of scapula, right shoulder, initial encounter for closed fracture: Secondary | ICD-10-CM

## 2017-12-01 DIAGNOSIS — S42001K Fracture of unspecified part of right clavicle, subsequent encounter for fracture with nonunion: Secondary | ICD-10-CM

## 2017-12-02 DIAGNOSIS — G4733 Obstructive sleep apnea (adult) (pediatric): Secondary | ICD-10-CM | POA: Diagnosis not present

## 2017-12-09 DIAGNOSIS — L821 Other seborrheic keratosis: Secondary | ICD-10-CM | POA: Diagnosis not present

## 2017-12-09 DIAGNOSIS — C44622 Squamous cell carcinoma of skin of right upper limb, including shoulder: Secondary | ICD-10-CM | POA: Diagnosis not present

## 2017-12-09 DIAGNOSIS — D0461 Carcinoma in situ of skin of right upper limb, including shoulder: Secondary | ICD-10-CM | POA: Diagnosis not present

## 2017-12-09 DIAGNOSIS — L814 Other melanin hyperpigmentation: Secondary | ICD-10-CM | POA: Diagnosis not present

## 2017-12-09 DIAGNOSIS — Z85828 Personal history of other malignant neoplasm of skin: Secondary | ICD-10-CM | POA: Diagnosis not present

## 2017-12-09 DIAGNOSIS — L570 Actinic keratosis: Secondary | ICD-10-CM | POA: Diagnosis not present

## 2017-12-09 DIAGNOSIS — L720 Epidermal cyst: Secondary | ICD-10-CM | POA: Diagnosis not present

## 2017-12-09 DIAGNOSIS — C44329 Squamous cell carcinoma of skin of other parts of face: Secondary | ICD-10-CM | POA: Diagnosis not present

## 2017-12-10 DIAGNOSIS — M25511 Pain in right shoulder: Secondary | ICD-10-CM | POA: Diagnosis not present

## 2017-12-12 DIAGNOSIS — C641 Malignant neoplasm of right kidney, except renal pelvis: Secondary | ICD-10-CM | POA: Diagnosis not present

## 2017-12-12 DIAGNOSIS — D4101 Neoplasm of uncertain behavior of right kidney: Secondary | ICD-10-CM | POA: Diagnosis not present

## 2017-12-15 ENCOUNTER — Telehealth: Payer: Self-pay | Admitting: *Deleted

## 2017-12-15 DIAGNOSIS — D49511 Neoplasm of unspecified behavior of right kidney: Secondary | ICD-10-CM | POA: Diagnosis not present

## 2017-12-15 DIAGNOSIS — R8279 Other abnormal findings on microbiological examination of urine: Secondary | ICD-10-CM | POA: Diagnosis not present

## 2017-12-15 DIAGNOSIS — N202 Calculus of kidney with calculus of ureter: Secondary | ICD-10-CM | POA: Diagnosis not present

## 2017-12-15 NOTE — Telephone Encounter (Signed)
Patient's wife called with concerns worried that her husband was showing symptoms similar to when he had a staph infection. She states he has had a fever of 99-100, is showing symptoms of urinary tract infection. He was seen by Dr Karsten Ro today (who sent in urine analysis and urine culture).  RN advised patient to see his primary care physician for general work up as well, advised that we would be happy to help or see him if his physicians wanted to send him back to [place]. Landis Gandy, RN

## 2017-12-16 ENCOUNTER — Encounter: Payer: Self-pay | Admitting: Emergency Medicine

## 2017-12-16 ENCOUNTER — Ambulatory Visit: Payer: 59 | Admitting: Family Medicine

## 2017-12-16 ENCOUNTER — Ambulatory Visit (INDEPENDENT_AMBULATORY_CARE_PROVIDER_SITE_OTHER): Payer: 59 | Admitting: Emergency Medicine

## 2017-12-16 VITALS — BP 128/68 | HR 91 | Temp 98.5°F | Resp 17 | Ht 70.5 in | Wt 196.0 lb

## 2017-12-16 DIAGNOSIS — Z8619 Personal history of other infectious and parasitic diseases: Secondary | ICD-10-CM | POA: Diagnosis not present

## 2017-12-16 DIAGNOSIS — R5383 Other fatigue: Secondary | ICD-10-CM | POA: Diagnosis not present

## 2017-12-16 MED ORDER — MUPIROCIN 2 % EX OINT: TOPICAL_OINTMENT | CUTANEOUS | 1 refills | Status: DC

## 2017-12-16 NOTE — Patient Instructions (Addendum)
     IF you received an x-ray today, you will receive an invoice from [facility]. Please contact [facility] at [phone] with questions or concerns regarding your invoice.   IF you received labwork today, you will receive an invoice from [facility]. Please contact [facility] at [phone] with questions or concerns regarding your invoice.   Our billing staff will not be able to assist you with questions regarding bills from these companies.  You will be contacted with the lab results as soon as they are available. The fastest way to get your results is to activate your My Chart account. Instructions are located on the last page of this paperwork. If you have not heard from [place] regarding the results in 2 weeks, please contact this office.      Fatigue Fatigue is feeling tired all of the time, a lack of energy, or a lack of motivation. Occasional or mild fatigue is often a normal response to activity or life in general. However, long-lasting (chronic) or extreme fatigue may indicate an underlying medical condition. Follow these instructions at home: Watch your fatigue for any changes. The following actions may help to lessen any discomfort you are feeling:  Talk to your health care provider about how much sleep you need each night. Try to get the required amount every night.  Take medicines only as directed by your health care provider.  Eat a healthy and nutritious diet. Ask your health care provider if you need help changing your diet.  Drink enough fluid to keep your urine clear or pale yellow.  Practice ways of relaxing, such as yoga, meditation, massage therapy, or acupuncture.  Exercise regularly.  Change situations that cause you stress. Try to keep your work and personal routine reasonable.  Do not abuse illegal drugs.  Limit alcohol intake to no more than 1 drink per day for nonpregnant women and 2 drinks per day for men. One drink equals 12 ounces of  beer, 5 ounces of wine, or 1 ounces of hard liquor.  Take a multivitamin, if directed by your health care provider.  Contact a health care provider if:  Your fatigue does not get better.  You have a fever.  You have unintentional weight loss or gain.  You have headaches.  You have difficulty: ? Falling asleep. ? Sleeping throughout the night.  You feel angry, guilty, anxious, or sad.  You are unable to have a bowel movement (constipation).  You skin is dry.  Your legs or another part of your body is swollen. Get help right away if:  You feel confused.  Your vision is blurry.  You feel faint or pass out.  You have a severe headache.  You have severe abdominal, pelvic, or back pain.  You have chest pain, shortness of breath, or an irregular or fast heartbeat.  You are unable to urinate or you urinate less than normal.  You develop abnormal bleeding, such as bleeding from the rectum, vagina, nose, lungs, or nipples.  You vomit blood.  You have thoughts about harming yourself or committing suicide.  You are worried that you might harm someone else. This information is not intended to replace advice given to you by your health care provider. Make sure you discuss any questions you have with your health care provider. Document Released: 04/07/2007 Document Revised: 11/16/2015 Document Reviewed: 10/12/2013 Elsevier Interactive Patient Education  [employer].

## 2017-12-16 NOTE — Progress Notes (Addendum)
[name] [name] 57 y.o.   Chief Complaint  Patient presents with  . Fatigue  . patient thinks he has a form of staff    HISTORY OF PRESENT ILLNESS: This is a 57 y.o. male complaining of fatigue for several days similar to episode he had about a year ago.  He is having lower urinary symptoms.  Saw the urologist yesterday and had urine culture.  No antibiotics started.  Denies fever or chills.  Has a history of Crohn's disease.  HPI   Prior to Admission medications   Medication Sig Start Date End Date Taking? Authorizing Provider  Ascorbic Acid (VITAMIN C PO) Take 1 tablet at bedtime by mouth.   Yes [provider]  aspirin EC 81 MG tablet Take 81 mg at bedtime by mouth.    Yes [provider]  Calcium Carb-Cholecalciferol (CALCIUM-VITAMIN D) 500-200 MG-UNIT tablet Take 1 tablet 2 (two) times daily by mouth.   Yes [provider]  cholecalciferol (VITAMIN D) 1000 units tablet Take 1,000 Units at bedtime by mouth.    Yes [provider]  Cyanocobalamin (VITAMIN B 12 PO) Take 1 tablet by mouth daily.   Yes [provider]  diltiazem (DILACOR XR) 180 MG 24 hr capsule Take 180 mg by mouth daily.   Yes [provider]  esomeprazole (NEXIUM) 40 MG capsule TAKE 1 CAPSULE TWICE A DAY.MAKE AN APPOINTMENT FOR    FURTHER REFILLS 10/27/17  Yes Ladene Artist, MD  Eszopiclone 3 MG TABS Take 1 tablet (3 mg total) by mouth at bedtime. Take immediately before bedtime 03/21/17  Yes Wardell Honour, MD  ferrous sulfate 325 (65 FE) MG tablet Take 325 mg at bedtime by mouth.    Yes [provider]  folic acid (FOLVITE) 1 MG tablet Take 2 tablets (2 mg total) by mouth daily. 10/27/17  Yes Wardell Honour, MD  glucose blood (CHOICE DM FORA G20 TEST STRIPS) test strip Use as instructed 11/13/12  Yes [name], MD  Lactase (DAIRY-RELIEF PO) Take 2 tablets by mouth 2 (two) times daily.    Yes [provider]  Lactulose 20 GM/30ML SOLN Take 30  mLs (20 g total) by mouth 2 (two) times daily. Patient taking differently: Take 30 mLs by mouth once.  09/15/17  Yes Ladene Artist, MD  lamoTRIgine (LAMICTAL) 150 MG tablet Take 1 tablet (150 mg total) by mouth daily. Patient taking differently: Take 150 mg by mouth 2 (two) times daily.  10/09/17  Yes Dennie Bible, NP  mercaptopurine (PURINETHOL) 50 MG tablet FOR DIRECTIONS ON HOW TO   TAKE THIS MEDICINE, READ   THE ENCLOSED MEDICATION    INFORMATION FORM Patient taking differently: Take 100 mg by mouth daily. FOR DIRECTIONS ON HOW TO   TAKE THIS MEDICINE, READ   THE ENCLOSED MEDICATION    INFORMATION FORM 08/27/17  Yes Ladene Artist, MD  mesalamine (LIALDA) 1.2 g EC tablet Take 2 tablets (2.4 g total) by mouth daily. 08/27/17  Yes Ladene Artist, MD  methscopolamine (PAMINE FORTE) 5 MG tablet Take 1 tablet (5 mg total) by mouth 2 (two) times daily. 07/21/17  Yes Ladene Artist, MD  ONE TOUCH ULTRA TEST test strip USE AS INSTRUCTED   Yes Weber, Sarah L, PA-C  potassium chloride SA (K-DUR,KLOR-CON) 20 MEQ tablet Take 1 tablet (20 mEq total) by mouth 2 (two) times daily. 10/27/17  Yes Wardell Honour, MD  Probiotic Product (PROBIOTIC PO) Take 1 tablet at  bedtime by mouth.    Yes [provider]  rifaximin (XIFAXAN) 550 MG TABS tablet Take 1 tablet (550 mg total) by mouth 2 (two) times daily. 08/27/17  Yes Ladene Artist, MD  sertraline (ZOLOFT) 100 MG tablet Take 200 mg daily with breakfast by mouth.  03/16/17  Yes [provider]  TraZODone HCl 150 MG TB24 Take by mouth. Once tablet daily   Yes [provider]    Allergies  Allergen Reactions  . Azithromycin Swelling    SWELLING REACTION UNSPECIFIED   . Beef-Derived Products Diarrhea    RED MEAT > UNSPECIFIED REACTION   . Milk-Related Compounds Diarrhea    DAIRY >UNSPECIFIED REACTION   . Penicillins Hives    Rash as child- tolerates Ancef/CHILDHOOD ALLERGY Has patient had a PCN reaction causing immediate  rash, facial/tongue/throat swelling, SOB or lightheadedness with hypotension: YES Has patient had a PCN reaction causing severe rash involving mucus membranes or skin necrosis: Unknown Has patient had a PCN reaction that required hospitalization: Unknown Has patient had a PCN reaction occurring within the last 10 years:Unknown If all of the above answers are "NO", then may proceed with Cephalosporin [place]  . Sulfonamide Derivatives Hives  . Dilaudid [Hydromorphone Hcl] Nausea And Vomiting    Patient Active Problem List   Diagnosis Date Noted  . Therapeutic drug monitoring 10/07/2017  . Renal cyst, acquired, left 07/08/2017  . Frequent falls 07/08/2017  . Staphylococcus aureus bacteremia with sepsis ([place]) 05/28/2017  . Renal lesion 05/28/2017  . Bacteremia due to Staphylococcus aureus 05/06/2017  . MSSA bacteremia 05/06/2017  . Confusion 04/02/2017  . Syncope 08/30/2016  . Depression 04/30/2016  . Memory loss 04/30/2016  . OSA on CPAP 10/31/2015  . Nonepileptic episode ([place]) 04/25/2015  . Seizure ([place]) 04/04/2013  . Abnormal EKG 03/12/2013  . DOE (dyspnea on exertion) 03/12/2013  . Complex partial seizure ([place]) 04/19/2012  . DM2 (diabetes mellitus, type 2) ([place]) 09/25/2011  . Altered mental status 09/25/2011  . OTHER DYSPHAGIA 03/27/2009  . TRANSAMINASES, SERUM, ELEVATED 03/27/2009  . Cough 11/10/2008  . GERD 12/07/2007  . COLONIC POLYPS, ADENOMATOUS, HX OF 12/07/2007  . EXTERNAL HEMORRHOIDS 09/11/2007  . CROHN'S DISEASE, LARGE AND SMALL INTESTINES 09/11/2007  . OSTEOPOROSIS 09/11/2007  . HYPERLIPIDEMIA NEC/NOS 02/27/2007  . Essential hypertension 02/27/2007    Past Medical History:  Diagnosis Date  . Adenomatous colon polyp 10/1999  . Anxiety   . Arthritis    neck, yoga helps.  . Cancer ([place])    skin  . Cataract   . Complex partial seizure ([place])   . Crohn's disease of small and large intestines ([place]) 1999  . Depression   . Diabetes mellitus    no meds at this time  10-24-17  . Esophageal stricture   . External hemorrhoids   . GERD (gastroesophageal reflux disease)   . History of kidney stones    3 large stones still present  . Hyperlipemia   . Hypertension   . Migraines   . MRSA (methicillin resistant Staphylococcus aureus) 10/2010   . Osteoporosis   . PONV (postoperative nausea and vomiting)   . PTSD (post-traumatic stress disorder)   . Renal cyst, acquired, left 07/08/2017  . Renal lesion 05/28/2017  . Seizures ([place])   . Skin cancer    squamous cell multiple; [place]; followed every 3 months.  . Sleep apnea    CPAP machine, uses nightly  . Staphylococcus aureus bacteremia with sepsis ([place]) 05/28/2017    Past Surgical History:  Procedure Laterality Date  . CATARACT EXTRACTION Bilateral   . COLONOSCOPY    . ELBOW SURGERY  2012   elbow MRSA infection   . EYE SURGERY     cataracts removed, /w IOL  . INSERTION OF MESH N/A 07/01/2016   Procedure: INSERTION OF MESH;  Surgeon: [name], MD;  Location: [facility];  Service: General;  Laterality: N/A;  . SCALP LACERATION REPAIR Right 10/16/2017   From fall/staples  . SHOULDER SURGERY    . SINUS SURGERY WITH INSTATRAK    . TEE WITHOUT CARDIOVERSION N/A 05/09/2017   Procedure: TRANSESOPHAGEAL ECHOCARDIOGRAM (TEE);  Surgeon: [name], MD;  Location[facility];  Service: Cardiovascular;  Laterality: N/A;  . TRANSTHORACIC ECHOCARDIOGRAM  02/22/2011   EF 55-65%; increased pattern of LVH with mild conc hypertrophy, abnormal relaxation & increased filling pressure (grade 2 diastolic dysfunction); atrial septum thickened (lipomatous hypertrophy)  . UMBILICAL HERNIA REPAIR N/A 07/01/2016   Procedure: UMBILICAL HERNIA REPAIR WITH MESH;  Surgeon: [name], MD;  Location: [facility];  Service: General;  Laterality: N/A;  . UPPER GASTROINTESTINAL ENDOSCOPY    . VASECTOMY      Social History   Socioeconomic History  . Marital status: Married    Spouse name: [name]  . Number of children: 2  .  Years of education: college  . Highest education level: Not on file  Occupational History  . Occupation: disabled    Comment: seizure disorder with memory impairment  Social Needs  . Financial resource strain: Not on file  . Food insecurity:    Worry: Not on file    Inability: Not on file  . Transportation needs:    Medical: Not on file    Non-medical: Not on file  Tobacco Use  . Smoking status: Never Smoker  . Smokeless tobacco: Never Used  Substance and Sexual Activity  . Alcohol use: No  . Drug use: No  . Sexual activity: Yes    Birth control/protection: Surgical    Comment: 1 partner in last 12 months  Lifestyle  . Physical activity:    Days per week: Not on file    Minutes per session: Not on file  . Stress: Not on file  Relationships  . Social connections:    Talks on phone: Not on file    Gets together: Not on file    Attends religious service: Not on file    Active member of club or organization: Not on file    Attends meetings of clubs or organizations: Not on file    Relationship status: Not on file  . Intimate partner violence:    Fear of current or ex partner: Not on file    Emotionally abused: Not on file    Physically abused: Not on file    Forced sexual activity: Not on file  Other Topics Concern  . Not on file  Social History Narrative   Marital status: married x 32 years;       Lives:   lives at home with his family.      Children:  two children; no grandchildren.  Son is special needs at age 40yo; Cornelian Delaine with TOF.       Employment: unemployed; [employer] long term disability      Tobacco: none      Alcohol: none      Drugs: none      Exercise: unable to exercise due to frequent falls   Patient has a college education.  Patient is right-handed.   Patient drinks one cup of soda daily.    Family History  Problem Relation Age of Onset  . Colon polyps Father   . Stroke Father   . Hyperlipidemia Father   . Hypertension Father     . Heart disease Mother        CABG at age 72  . Hyperlipidemia Mother   . Hypertension Mother   . Stroke Paternal Grandmother   . Stroke Paternal Grandfather   . Other Child        tetrology of fallot (cornealia deland syndrome)  . Colon cancer Neg Hx   . Esophageal cancer Neg Hx   . Stomach cancer Neg Hx   . Rectal cancer Neg Hx      Review of Systems  Constitutional: Negative.  Negative for chills and fever.  HENT: Negative.  Negative for sore throat.   Eyes: Negative.  Negative for discharge and redness.  Respiratory: Negative for cough and shortness of breath.   Cardiovascular: Negative.  Negative for chest pain and palpitations.  Gastrointestinal: Positive for abdominal pain (chronic).  Genitourinary: Positive for frequency and urgency.  Skin: Negative.  Negative for rash.  Neurological: Negative for dizziness and headaches.  All other systems reviewed and are negative.   Vitals:   12/16/17 1711  BP: 128/68  Pulse: 91  Resp: 17  Temp: 98.5 F (36.9 C)  SpO2: 98%    Physical Exam  Constitutional: He is oriented to person, place, and time. He appears well-developed and well-nourished.  HENT:  Head: Normocephalic and atraumatic.  Eyes: Pupils are equal, round, and reactive to light.  Neck: Normal range of motion. Neck supple.  Cardiovascular: Normal rate and regular rhythm.  Pulmonary/Chest: Effort normal and breath sounds normal.  Abdominal: Soft. He exhibits no distension. There is tenderness (diffuse).  Musculoskeletal: Normal range of motion. He exhibits no edema or tenderness.  Neurological: He is alert and oriented to person, place, and time. No sensory deficit. He exhibits normal muscle tone.  Skin: Skin is warm and dry. Capillary refill takes less than 2 seconds.  Right forearm: Positive scabbed over lesion, site of biopsy, with mild erythema.  Wife states it looks better than before.  Psychiatric: He has a normal mood and affect. His behavior is normal.      ASSESSMENT & PLAN: [name] [name] was seen today for fatigue and patient thinks he has a form of staff.  Diagnoses and all orders for this visit:  Fatigue, unspecified type -     CBC with Differential/Platelet -     Basic metabolic panel -     Culture, blood (single) -     Culture, blood (single)  History of sepsis -     Culture, blood (single) -     Culture, blood (single)  Other orders -     mupirocin ointment (BACTROBAN) 2 %; Sig to affected area twice a day x 7 days.    Patient Instructions       IF you received an x-ray today, you will receive an invoice from [facility]. Please contact [facility] at [phone] with questions or concerns regarding your invoice.   IF you received labwork today, you will receive an invoice from [facility]. Please contact [facility] at [phone] with questions or concerns regarding your invoice.   Our billing staff will not be able to assist you with questions regarding bills from these companies.  You will be contacted with the lab results as soon as  they are available. The fastest way to get your results is to activate your My Chart account. Instructions are located on the last page of this paperwork. If you have not heard from [place] regarding the results in 2 weeks, please contact this office.      Fatigue Fatigue is feeling tired all of the time, a lack of energy, or a lack of motivation. Occasional or mild fatigue is often a normal response to activity or life in general. However, long-lasting (chronic) or extreme fatigue may indicate an underlying medical condition. Follow these instructions at home: Watch your fatigue for any changes. The following actions may help to lessen any discomfort you are feeling:  Talk to your health care provider about how much sleep you need each night. Try to get the required amount every night.  Take medicines only as directed by your health care provider.  Eat a healthy and  nutritious diet. Ask your health care provider if you need help changing your diet.  Drink enough fluid to keep your urine clear or pale yellow.  Practice ways of relaxing, such as yoga, meditation, massage therapy, or acupuncture.  Exercise regularly.  Change situations that cause you stress. Try to keep your work and personal routine reasonable.  Do not abuse illegal drugs.  Limit alcohol intake to no more than 1 drink per day for nonpregnant women and 2 drinks per day for men. One drink equals 12 ounces of beer, 5 ounces of wine, or 1 ounces of hard liquor.  Take a multivitamin, if directed by your health care provider.  Contact a health care provider if:  Your fatigue does not get better.  You have a fever.  You have unintentional weight loss or gain.  You have headaches.  You have difficulty: ? Falling asleep. ? Sleeping throughout the night.  You feel angry, guilty, anxious, or sad.  You are unable to have a bowel movement (constipation).  You skin is dry.  Your legs or another part of your body is swollen. Get help right away if:  You feel confused.  Your vision is blurry.  You feel faint or pass out.  You have a severe headache.  You have severe abdominal, pelvic, or back pain.  You have chest pain, shortness of breath, or an irregular or fast heartbeat.  You are unable to urinate or you urinate less than normal.  You develop abnormal bleeding, such as bleeding from the rectum, vagina, nose, lungs, or nipples.  You vomit blood.  You have thoughts about harming yourself or committing suicide.  You are worried that you might harm someone else. This information is not intended to replace advice given to you by your health care provider. Make sure you discuss any questions you have with your health care provider. Document Released: 04/07/2007 Document Revised: 11/16/2015 Document Reviewed: 10/12/2013 Elsevier Interactive Patient Education  2018  Elsevier Inc.      Agustina Caroli, MD [facility]

## 2017-12-17 ENCOUNTER — Encounter: Payer: Self-pay | Admitting: Emergency Medicine

## 2017-12-17 ENCOUNTER — Encounter: Payer: Self-pay | Admitting: Gastroenterology

## 2017-12-17 ENCOUNTER — Ambulatory Visit (INDEPENDENT_AMBULATORY_CARE_PROVIDER_SITE_OTHER): Payer: 59 | Admitting: Gastroenterology

## 2017-12-17 VITALS — BP 120/70 | HR 92 | Temp 99.2°F | Ht 70.5 in | Wt 196.0 lb

## 2017-12-17 DIAGNOSIS — K50819 Crohn's disease of both small and large intestine with unspecified complications: Secondary | ICD-10-CM

## 2017-12-17 DIAGNOSIS — K7460 Unspecified cirrhosis of liver: Secondary | ICD-10-CM

## 2017-12-17 LAB — CBC WITH DIFFERENTIAL/PLATELET
BASOS ABS: 0 10*3/uL (ref 0.0–0.2)
BASOS: 1 %
EOS (ABSOLUTE): 0.2 10*3/uL (ref 0.0–0.4)
EOS: 4 %
HEMOGLOBIN: 10.8 g/dL — AB (ref 13.0–17.7)
Hematocrit: 30.7 % — ABNORMAL LOW (ref 37.5–51.0)
IMMATURE GRANS (ABS): 0 10*3/uL (ref 0.0–0.1)
Immature Granulocytes: 1 %
LYMPHS ABS: 0.5 10*3/uL — AB (ref 0.7–3.1)
Lymphs: 9 %
MCH: 39 pg — AB (ref 26.6–33.0)
MCHC: 35.2 g/dL (ref 31.5–35.7)
MCV: 111 fL — AB (ref 79–97)
MONOS ABS: 0.9 10*3/uL (ref 0.1–0.9)
Monocytes: 16 %
NEUTROS PCT: 69 %
Neutrophils Absolute: 3.6 10*3/uL (ref 1.4–7.0)
Platelets: 120 10*3/uL — ABNORMAL LOW (ref 150–450)
RBC: 2.77 x10E6/uL — ABNORMAL LOW (ref 4.14–5.80)
RDW: 17.3 % — ABNORMAL HIGH (ref 12.3–15.4)
WBC: 5.2 10*3/uL (ref 3.4–10.8)

## 2017-12-17 LAB — BASIC METABOLIC PANEL
BUN / CREAT RATIO: 14 (ref 9–20)
BUN: 12 mg/dL (ref 6–24)
CO2: 19 mmol/L — ABNORMAL LOW (ref 20–29)
CREATININE: 0.86 mg/dL (ref 0.76–1.27)
Calcium: 9 mg/dL (ref 8.7–10.2)
Chloride: 110 mmol/L — ABNORMAL HIGH (ref 96–106)
GFR calc non Af Amer: 97 mL/min/{1.73_m2} (ref >59)
GFR, EST AFRICAN AMERICAN: 112 mL/min/{1.73_m2} (ref >59)
GLUCOSE: 99 mg/dL (ref 65–99)
Potassium: 3.9 mmol/L (ref 3.5–5.2)
SODIUM: 144 mmol/L (ref 134–144)

## 2017-12-17 NOTE — Patient Instructions (Signed)
Take Imodium twice daily as needed.   Thank you for choosing me and [facility].  Pricilla Riffle. Dagoberto Ligas., MD., [facility]

## 2017-12-17 NOTE — Progress Notes (Signed)
    History of Present Illness: This is a 57 year old male with Crohn's ileocolitis and cirrhosis returning for follow-up.  He is accompanied by his wife.  He continues to have diarrhea which is relatively stable occurring 2-4 times per day.  Occasionally notes small amounts of bleeding with his diarrhea.  His gait remains unsteady.  He suffered a fall several weeks ago and suffered a right scapular fracture.  He suffered a fall in February with a right seventh rib fracture.  Current Medications, Allergies, Past Medical History, Past Surgical History, Family History and Social History were reviewed in [employer] record.  Physical Exam: General: Well developed, well nourished, no acute distress Head: Normocephalic and atraumatic Eyes:  sclerae anicteric, EOMI Ears: Normal auditory acuity Mouth: No deformity or lesions Lungs: Clear throughout to auscultation Heart: Regular rate and rhythm; no murmurs, rubs or bruits Abdomen: Soft, non tender and non distended. No masses, hepatosplenomegaly or hernias noted. Normal Bowel sounds Musculoskeletal: Symmetrical with no gross deformities  Pulses:  Normal pulses noted Extremities: No clubbing, cyanosis, edema or deformities noted Neurological: Alert oriented x 4, grossly nonfocal, no asterixis Psychological:  Alert and cooperative. Normal mood and affect  Assessment and Recommendations:  1. Crohn's ileocolitis.  Recent blood work reviewed.  Continue 6-mercaptopurine 100 mg daily.  Imodium twice daily as needed.  REV in 4 months.   2. Cirrhosis presumed secondary to NASH.  Although his ammonia level has been elevated his HE symptoms are mild and HE does not appear to be the primary cause of his episodic confusion and gait instability. Neurology diagnosed pseudoseizures and neuropsych evaluation diagnosed a conversion disorder. Continue Xifaxan 550 mg twice daily.  He is unable to tolerate lactulose due to worsening diarrhea.  Schedule RUQ [place] for [place] screening.   3. History adenomatous colon polyps.  Deferred surveillance colonoscopy for now given his other ongoing medical problems.  Will reassess in the future.  4. Neuro. Unsteady gait, frequent falls.  Follow-up with neurology.

## 2017-12-18 ENCOUNTER — Telehealth: Payer: Self-pay | Admitting: Family Medicine

## 2017-12-18 NOTE — Telephone Encounter (Signed)
Betty from [facility] call with critical results for patient:  + blood culture in aerobic bottle only with gram + cocci and cluster / Patient verified, information read back and confirmed. / Will route to provider / Called office and spoke to shannon to let her know information is coming.

## 2017-12-22 LAB — CULTURE, BLOOD (SINGLE)

## 2017-12-22 NOTE — Telephone Encounter (Signed)
Blood culture report discussed with patient. Patient was started on Cipro by his urologist recently. Denies fever and feels he's getting back to his baseline.

## 2017-12-30 ENCOUNTER — Other Ambulatory Visit: Payer: Self-pay | Admitting: Urology

## 2017-12-30 ENCOUNTER — Other Ambulatory Visit (HOSPITAL_COMMUNITY): Payer: Self-pay | Admitting: Urology

## 2017-12-30 DIAGNOSIS — N200 Calculus of kidney: Secondary | ICD-10-CM

## 2018-01-01 DIAGNOSIS — G4733 Obstructive sleep apnea (adult) (pediatric): Secondary | ICD-10-CM | POA: Diagnosis not present

## 2018-01-05 ENCOUNTER — Other Ambulatory Visit: Payer: Self-pay

## 2018-01-05 ENCOUNTER — Ambulatory Visit (INDEPENDENT_AMBULATORY_CARE_PROVIDER_SITE_OTHER): Payer: 59 | Admitting: Family Medicine

## 2018-01-05 ENCOUNTER — Encounter: Payer: Self-pay | Admitting: Family Medicine

## 2018-01-05 VITALS — BP 110/70 | HR 95 | Temp 98.5°F | Resp 16 | Ht 71.26 in | Wt 195.0 lb

## 2018-01-05 DIAGNOSIS — E119 Type 2 diabetes mellitus without complications: Secondary | ICD-10-CM

## 2018-01-05 DIAGNOSIS — R350 Frequency of micturition: Secondary | ICD-10-CM

## 2018-01-05 DIAGNOSIS — I10 Essential (primary) hypertension: Secondary | ICD-10-CM | POA: Diagnosis not present

## 2018-01-05 DIAGNOSIS — Z9989 Dependence on other enabling machines and devices: Secondary | ICD-10-CM

## 2018-01-05 DIAGNOSIS — G4733 Obstructive sleep apnea (adult) (pediatric): Secondary | ICD-10-CM | POA: Diagnosis not present

## 2018-01-05 DIAGNOSIS — D6489 Other specified anemias: Secondary | ICD-10-CM

## 2018-01-05 DIAGNOSIS — G40209 Localization-related (focal) (partial) symptomatic epilepsy and epileptic syndromes with complex partial seizures, not intractable, without status epilepticus: Secondary | ICD-10-CM

## 2018-01-05 DIAGNOSIS — R569 Unspecified convulsions: Secondary | ICD-10-CM

## 2018-01-05 DIAGNOSIS — F321 Major depressive disorder, single episode, moderate: Secondary | ICD-10-CM

## 2018-01-05 DIAGNOSIS — Z8619 Personal history of other infectious and parasitic diseases: Secondary | ICD-10-CM

## 2018-01-05 DIAGNOSIS — K5080 Crohn's disease of both small and large intestine without complications: Secondary | ICD-10-CM

## 2018-01-05 DIAGNOSIS — R296 Repeated falls: Secondary | ICD-10-CM

## 2018-01-05 LAB — POCT URINALYSIS DIP (MANUAL ENTRY)
BILIRUBIN UA: NEGATIVE
BILIRUBIN UA: NEGATIVE mg/dL
Glucose, UA: NEGATIVE mg/dL
Nitrite, UA: NEGATIVE
Protein Ur, POC: 100 mg/dL — AB
SPEC GRAV UA: 1.015 (ref 1.010–1.025)
Urobilinogen, UA: 1 E.U./dL
pH, UA: 7 (ref 5.0–8.0)

## 2018-01-05 MED ORDER — CIPROFLOXACIN HCL 500 MG PO TABS: 500.0000 mg | ORAL_TABLET | Freq: Two times a day (BID) | ORAL | 0 refills | Status: DC

## 2018-01-05 NOTE — Progress Notes (Signed)
Subjective:    Patient ID: [name] [name], male    DOB: October 15, 1960, 57 y.o.   MRN: [id number]  01/05/2018  Hypertension (2 month follow-up )    HPI This 57 y.o. male presents for two month follow-up of frequent falls, glucose intolerance, hypertension, cirrhosis, dizziness. Management changes made at last visit included stopping diltiazem.   [place] and fractured RIGHT scapula; might have injured ribs.  Follow-up xray showed 7-8 rib fractures.  S/p CT thoracic spine per [facility].    Dysuria, fever: onset at end of June; had urology appointment; known kidney stone in kidney.  Sent urine off for culture; treated with Cipro for 7 or 10 days; possibly 10 days.  Paranoid due to similar to last November 2018.  Patient and wife presented due to fatigue; blood cx 1/2 grew staph aureus.  Urinary frequency and cloudiness recurred last few days.  No fever/chills/sweats.  Finished Cipro ten days ago. Kidney stones are large 9/11/12mm. Scheduled for removal in August 2019.  Twenty four hour admission for surgical removal.  Goes in for interventional radiology; removal; go home the next day.   Also needs surgery on R clavicle.  [place].  End of clavicle is avulsed/broken off.  Unable to move certain ways with R shoulder.   Dizziness:   Still present.  Stopped diltiazem; blood pressures have been stable off of it.  Intermittent unsteadiness now.    [name]/cirrhosis: stopped Lactulose; started Xifaxin and to continue it.   Needs colonoscopy yet not too concerned about it.  Scheduled abdominal US.  S/p sleep study; got new CPAP.  Failed miserably.  Wearing every day..  Snores horribly.    S/p dermatology consultation; s/p skin bx by dermatology; R forearm became infected/looked badly.   BP Readings from Last 3 Encounters:  01/05/18 110/70  12/17/17 120/70  12/16/17 128/68   Wt Readings from Last 3 Encounters:  01/05/18 195 lb (88.5 kg)  12/17/17 196 lb (88.9  kg)  12/16/17 196 lb (88.9 kg)   Immunization History  Administered Date(s) Administered  . Influenza Split 03/24/2013, 03/24/2014, 03/26/2016  . Influenza Whole 03/24/2012  . Influenza,inj,Quad PF,6+ Mos 02/16/2015, 04/15/2017  . Influenza-Unspecified 04/15/2017  . Pneumococcal Conjugate-13 07/07/2014  . Pneumococcal Polysaccharide-23 06/08/2007, 06/23/2017  . Tdap 05/15/2016  . Zoster 08/18/2014    Review of Systems  Constitutional: Negative for activity change, appetite change, chills, diaphoresis, fatigue, fever and unexpected weight change.  HENT: Negative for congestion, dental problem, drooling, ear discharge, ear pain, facial swelling, hearing loss, mouth sores, nosebleeds, postnasal drip, rhinorrhea, sinus pressure, sneezing, sore throat, tinnitus, trouble swallowing and voice change.   Eyes: Negative for photophobia, pain, discharge, redness, itching and visual disturbance.  Respiratory: Negative for apnea, cough, choking, chest tightness, shortness of breath, wheezing and stridor.   Cardiovascular: Negative for chest pain, palpitations and leg swelling.  Gastrointestinal: Negative for abdominal distention, abdominal pain, anal bleeding, blood in stool, constipation, diarrhea, nausea, rectal pain and vomiting.  Endocrine: Negative for cold intolerance, heat intolerance, polydipsia, polyphagia and polyuria.  Genitourinary: Positive for frequency and urgency. Negative for decreased urine volume, difficulty urinating, discharge, dysuria, enuresis, flank pain, genital sores, hematuria, penile pain, penile swelling, scrotal swelling and testicular pain.  Musculoskeletal: Positive for arthralgias, back pain and gait problem. Negative for joint swelling, myalgias, neck pain and neck stiffness.  Skin: Negative for color change, pallor, rash and wound.  Allergic/Immunologic: Negative for environmental allergies, food allergies and immunocompromised state.  Neurological: Positive for  dizziness. Negative for tremors, seizures, syncope, facial asymmetry, speech difficulty, weakness, light-headedness, numbness and headaches.  Hematological: Negative for adenopathy. Does not bruise/bleed easily.  Psychiatric/Behavioral: Positive for confusion and decreased concentration. Negative for agitation, behavioral problems, dysphoric mood, hallucinations, self-injury, sleep disturbance and suicidal ideas. The patient is not nervous/anxious and is not hyperactive.     Past Medical History:  Diagnosis Date  . Adenomatous colon polyp 10/1999  . Anxiety   . Arthritis    neck, yoga helps.  . Cancer ([place])    skin  . Cataract   . Complex partial seizure ([place])   . Crohn's disease of small and large intestines ([place]) 1999  . Depression   . Diabetes mellitus    no meds at this time 10-24-17  . Esophageal stricture   . External hemorrhoids   . GERD (gastroesophageal reflux disease)   . History of kidney stones    3 large stones still present  . Hyperlipemia   . Hypertension   . Migraines   . MRSA (methicillin resistant Staphylococcus aureus) 10/2010   . Osteoporosis   . PONV (postoperative nausea and vomiting)   . PTSD (post-traumatic stress disorder)   . Renal cyst, acquired, left 07/08/2017  . Renal lesion 05/28/2017  . Seizures ([place])   . Skin cancer    squamous cell multiple; [place]; followed every 3 months.  . Sleep apnea    CPAP machine, uses nightly  . Staphylococcus aureus bacteremia with sepsis ([place]) 05/28/2017   Past Surgical History:  Procedure Laterality Date  . CATARACT EXTRACTION Bilateral   . COLONOSCOPY    . ELBOW SURGERY  2012   elbow MRSA infection   . EYE SURGERY     cataracts removed, /w IOL  . INSERTION OF MESH N/A 07/01/2016   Procedure: INSERTION OF MESH;  Surgeon: [name], MD;  Location: [place];  Service: General;  Laterality: N/A;  . SCALP LACERATION REPAIR Right 10/16/2017   From fall/staples  . SHOULDER SURGERY    . SINUS SURGERY WITH  INSTATRAK    . TEE WITHOUT CARDIOVERSION N/A 05/09/2017   Procedure: TRANSESOPHAGEAL ECHOCARDIOGRAM (TEE);  Surgeon: [name], MD;  Location: [facility];  Service: Cardiovascular;  Laterality: N/A;  . TRANSTHORACIC ECHOCARDIOGRAM  02/22/2011   EF 55-65%; increased pattern of LVH with mild conc hypertrophy, abnormal relaxation & increased filling pressure (grade 2 diastolic dysfunction); atrial septum thickened (lipomatous hypertrophy)  . UMBILICAL HERNIA REPAIR N/A 07/01/2016   Procedure: UMBILICAL HERNIA REPAIR WITH MESH;  Surgeon: [name], MD;  Location: [place];  Service: General;  Laterality: N/A;  . UPPER GASTROINTESTINAL ENDOSCOPY    . VASECTOMY     Allergies  Allergen Reactions  . Azithromycin Swelling    SWELLING REACTION UNSPECIFIED   . Beef-Derived Products Diarrhea    RED MEAT > UNSPECIFIED REACTION   . Milk-Related Compounds Diarrhea    DAIRY >UNSPECIFIED REACTION   . Penicillins Hives    Rash as child- tolerates Ancef/CHILDHOOD ALLERGY Has patient had a PCN reaction causing immediate rash, facial/tongue/throat swelling, SOB or lightheadedness with hypotension: YES Has patient had a PCN reaction causing severe rash involving mucus membranes or skin necrosis: Unknown Has patient had a PCN reaction that required hospitalization: Unknown Has patient had a PCN reaction occurring within the last 10 years:Unknown If all of the above answers are "NO", then may proceed with Cephalosporin [place]  . Sulfonamide Derivatives Hives  . Dilaudid [Hydromorphone Hcl] Nausea And Vomiting   Current Outpatient Medications on  File Prior to Visit  Medication Sig Dispense Refill  . Ascorbic Acid (VITAMIN C PO) Take 1 tablet at bedtime by mouth.    Marland Kitchen aspirin EC 81 MG tablet Take 81 mg at bedtime by mouth.     . Calcium Carb-Cholecalciferol (CALCIUM-VITAMIN D) 500-200 MG-UNIT tablet Take 1 tablet 2 (two) times daily by mouth.    . cholecalciferol (VITAMIN D) 1000 units tablet Take 1,000  Units at bedtime by mouth.     . Cyanocobalamin (VITAMIN B 12 PO) Take 1 tablet by mouth daily.    Marland Kitchen esomeprazole (NEXIUM) 40 MG capsule TAKE 1 CAPSULE TWICE A DAY.MAKE AN APPOINTMENT FOR    FURTHER REFILLS 180 capsule 0  . Eszopiclone 3 MG TABS Take 1 tablet (3 mg total) by mouth at bedtime. Take immediately before bedtime 30 tablet 1  . ferrous sulfate 325 (65 FE) MG tablet Take 325 mg at bedtime by mouth.     . folic acid (FOLVITE) 1 MG tablet Take 2 tablets (2 mg total) by mouth daily. 180 tablet 3  . glucose blood (CHOICE DM FORA G20 TEST STRIPS) test strip Use as instructed 100 each 12  . Lactase (DAIRY-RELIEF PO) Take 2 tablets by mouth 2 (two) times daily.     . Lactulose 20 GM/30ML SOLN Take 30 mLs (20 g total) by mouth 2 (two) times daily. (Patient taking differently: Take 30 mLs by mouth once. ) 1800 mL 3  . lamoTRIgine (LAMICTAL) 150 MG tablet Take 1 tablet (150 mg total) by mouth daily. (Patient taking differently: Take 150 mg by mouth 2 (two) times daily. ) 180 tablet 3  . mercaptopurine (PURINETHOL) 50 MG tablet FOR DIRECTIONS ON HOW TO   TAKE THIS MEDICINE, READ   THE ENCLOSED MEDICATION    INFORMATION FORM (Patient taking differently: Take 100 mg by mouth daily. FOR DIRECTIONS ON HOW TO   TAKE THIS MEDICINE, READ   THE ENCLOSED MEDICATION    INFORMATION FORM) 180 tablet 1  . mesalamine (LIALDA) 1.2 g EC tablet Take 2 tablets (2.4 g total) by mouth daily. 180 tablet 1  . methscopolamine (PAMINE FORTE) 5 MG tablet Take 1 tablet (5 mg total) by mouth 2 (two) times daily. 180 tablet 3  . mirtazapine (REMERON) 15 MG tablet Take 1 tablet by mouth at bedtime.    . mupirocin ointment (BACTROBAN) 2 % Sig to affected area twice a day x 7 days. 22 g 1  . ONE TOUCH ULTRA TEST test strip USE AS INSTRUCTED 100 each 2  . oxyCODONE (OXY IR/ROXICODONE) 5 MG immediate release tablet TK 1 TO 2 TS PO Q 4 TO 6 H PRN P  0  . potassium chloride SA (K-DUR,KLOR-CON) 20 MEQ tablet Take 1 tablet (20 mEq  total) by mouth 2 (two) times daily. 180 tablet 1  . Probiotic Product (PROBIOTIC PO) Take 1 tablet at bedtime by mouth.     . rifaximin (XIFAXAN) 550 MG TABS tablet Take 1 tablet (550 mg total) by mouth 2 (two) times daily. 180 tablet 3  . sertraline (ZOLOFT) 100 MG tablet Take 200 mg daily with breakfast by mouth.      Current Facility-Administered Medications on File Prior to Visit  Medication Dose [name] Frequency Provider Last Rate Last Dose  . gadopentetate dimeglumine (MAGNEVIST) injection 20 mL  20 mL Intravenous Once PRN Marcial Pacas, MD       Social History   Socioeconomic History  . Marital status: Married    Spouse name:  [name]  . Number of children: 2  . Years of education: college  . Highest education level: Not on file  Occupational History  . Occupation: disabled    Comment: seizure disorder with memory impairment  Social Needs  . Financial resource strain: Not on file  . Food insecurity:    Worry: Not on file    Inability: Not on file  . Transportation needs:    Medical: Not on file    Non-medical: Not on file  Tobacco Use  . Smoking status: Never Smoker  . Smokeless tobacco: Never Used  Substance and Sexual Activity  . Alcohol use: No  . Drug use: No  . Sexual activity: Yes    Birth control/protection: Surgical    Comment: 1 partner in last 12 months  Lifestyle  . Physical activity:    Days per week: Not on file    Minutes per session: Not on file  . Stress: Not on file  Relationships  . Social connections:    Talks on phone: Not on file    Gets together: Not on file    Attends religious service: Not on file    Active member of club or organization: Not on file    Attends meetings of clubs or organizations: Not on file    Relationship status: Not on file  . Intimate partner violence:    Fear of current or ex partner: Not on file    Emotionally abused: Not on file    Physically abused: Not on file    Forced sexual activity: Not on file  Other Topics  Concern  . Not on file  Social History Narrative   Marital status: married x 32 years;       Lives:   lives at home with his family.      Children:  two children; no grandchildren.  Son is special needs at age 55yo; Cornelian Delaine with TOF.       Employment: unemployed; [employer] long term disability      Tobacco: none      Alcohol: none      Drugs: none      Exercise: unable to exercise due to frequent falls   Patient has a college education.   Patient is right-handed.   Patient drinks one cup of soda daily.   Family History  Problem Relation Age of Onset  . Colon polyps Father   . Stroke Father   . Hyperlipidemia Father   . Hypertension Father   . Heart disease Mother        CABG at age 108  . Hyperlipidemia Mother   . Hypertension Mother   . Stroke Paternal Grandmother   . Stroke Paternal Grandfather   . Other Child        tetrology of fallot (cornealia deland syndrome)  . Colon cancer Neg Hx   . Esophageal cancer Neg Hx   . Stomach cancer Neg Hx   . Rectal cancer Neg Hx        Objective:    BP 110/70   Pulse 95   Temp 98.5 F (36.9 C) (Oral)   Resp 16   Ht 5' 11.26" (1.81 m)   Wt 195 lb (88.5 kg)   SpO2 98%   BMI 27.00 kg/m  Physical Exam  Constitutional: He is oriented to person, place, and time. He appears well-developed and well-nourished. No distress.  HENT:  Head: Normocephalic and atraumatic.  Right Ear: External ear normal.  Left  Ear: External ear normal.  Nose: Nose normal.  Mouth/Throat: Oropharynx is clear and moist.  Eyes: Pupils are equal, round, and reactive to light. Conjunctivae and EOM are normal.  Neck: Normal range of motion. Neck supple. Carotid bruit is not present. No thyromegaly present.  Cardiovascular: Normal rate, regular rhythm, normal heart sounds and intact distal pulses. Exam reveals no gallop and no friction rub.  No murmur heard. Pulmonary/Chest: Effort normal and breath sounds normal. He has no wheezes. He has no  rales.  Musculoskeletal:       Right shoulder: Normal.       Left shoulder: Normal.       Cervical back: Normal.  Lymphadenopathy:    He has no cervical adenopathy.  Neurological: He is alert and oriented to person, place, and time. He has normal reflexes. No cranial nerve deficit or sensory deficit. He exhibits normal muscle tone. Coordination normal.  Skin: Skin is warm and dry. No rash noted. He is not diaphoretic.  Psychiatric: He has a normal mood and affect. His behavior is normal. Judgment and thought content normal.   No results found. Depression screen [facility] 2/9 01/05/2018 12/16/2017 07/26/2017 06/04/2017 05/28/2017  Decreased Interest 0 0 1 0 0  Down, Depressed, Hopeless 0 0 3 0 0  PHQ - 2 Score 0 0 4 0 0  Altered sleeping - - 2 - -  Tired, decreased energy - - 0 - 0  Change in appetite - - 2 - 0  Feeling bad or failure about yourself  - - 3 - 0  Trouble concentrating - - 3 - 0  Moving slowly or fidgety/restless - - 3 - 0  Suicidal thoughts - - 2 - 0  PHQ-9 Score - - 19 - -  Difficult doing work/chores - - Somewhat difficult - -  Some recent data might be hidden   Fall Risk  01/05/2018 12/16/2017 10/27/2017 07/26/2017 06/04/2017  Falls in the past year? Yes No Yes Yes Yes  Number falls in past yr: 1 - 2 or more 2 or more 2 or more  Injury with Fall? No - Yes No -  Comment - - - - -  Risk Factor Category  - - High Fall Risk - -  Risk for fall due to : - - - - -        Assessment & Plan:   1. Essential hypertension   2. Anemia due to other cause, not classified   3. Frequent urination   4. OSA on CPAP   5. Nonepileptic episode ([place])   6. Type 2 diabetes mellitus without complication, without long-term current use of insulin (HCC)   7. Crohn's disease of both small and large intestine without complication ([place])   8. Partial symptomatic epilepsy with complex partial seizures, not intractable, without status epilepticus ([place])   9. Frequent falls   10. History of sepsis      Hypertension: Blood pressures normal with cessation of calcium channel blocker.  Unfortunately, dizziness persist.  Macrocytic anemia: Chronic.  Obtain labs to evaluate for hemolytic anemia.  Consider hematology consultation.  Possibly due to cirrhosis; yet, would like to rule out other contributing etiology.  Frequent urination/urinary frequency: Recurrent.  Send urine culture.  Treat empirically with Cipro 500 mg twice daily for 2 weeks.  Diabetes mellitus type 2: Well-controlled with dietary modification.  No longer warranting oral agents.  Continue to monitor sugars closely.  Frequent falls: Most recent fall in June with right scapular fracture.  Also history of right clavicular fracture.  Will warrant surgery on right clavicle in upcoming months.  Major depressive disorder: Followed closely by psychiatry and therapist.  Mood improved today.  Orders Placed This Encounter  Procedures  . Urine Culture    Order Specific Question:   Source    Answer:   clean catch  . CBC with Differential/Platelet  . Comprehensive metabolic panel  . Lactate Dehydrogenase  . Reticulocytes  . Haptoglobin  . POCT urinalysis dipstick   Meds ordered this encounter  Medications  . ciprofloxacin (CIPRO) 500 MG tablet    Sig: Take 1 tablet (500 mg total) by mouth 2 (two) times daily.    Dispense:  30 tablet    Refill:  0    Return in about 3 months (around 04/07/2018) for follow-up chronic medical conditions SANTI[place].   Thoms Barthelemy Elayne Guerin, M.D. Primary Care at [facility] previously [facility] [place] [address] [place], [place]  [address] [phone] phone [phone] fax

## 2018-01-05 NOTE — Patient Instructions (Signed)
     IF you received an x-ray today, you will receive an invoice from [facility]. Please contact [facility] at [phone] with questions or concerns regarding your invoice.   IF you received labwork today, you will receive an invoice from [facility]. Please contact [facility] at [phone] with questions or concerns regarding your invoice.   Our billing staff will not be able to assist you with questions regarding bills from these companies.  You will be contacted with the lab results as soon as they are available. The fastest way to get your results is to activate your My Chart account. Instructions are located on the last page of this paperwork. If you have not heard from us regarding the results in 2 weeks, please contact this office.     

## 2018-01-06 LAB — CBC WITH DIFFERENTIAL/PLATELET
Basophils Absolute: 0 10*3/uL (ref 0.0–0.2)
Basos: 1 %
EOS (ABSOLUTE): 0.3 10*3/uL (ref 0.0–0.4)
EOS: 7 %
HEMATOCRIT: 30.3 % — AB (ref 37.5–51.0)
Hemoglobin: 10.9 g/dL — ABNORMAL LOW (ref 13.0–17.7)
IMMATURE GRANS (ABS): 0 10*3/uL (ref 0.0–0.1)
Immature Granulocytes: 1 %
LYMPHS ABS: 0.6 10*3/uL — AB (ref 0.7–3.1)
LYMPHS: 15 %
MCH: 38.9 pg — AB (ref 26.6–33.0)
MCHC: 36 g/dL — AB (ref 31.5–35.7)
MCV: 108 fL — ABNORMAL HIGH (ref 79–97)
MONOCYTES: 12 %
Monocytes Absolute: 0.5 10*3/uL (ref 0.1–0.9)
NEUTROS ABS: 2.8 10*3/uL (ref 1.4–7.0)
Neutrophils: 64 %
Platelets: 112 10*3/uL — ABNORMAL LOW (ref 150–450)
RBC: 2.8 x10E6/uL — ABNORMAL LOW (ref 4.14–5.80)
RDW: 16.7 % — ABNORMAL HIGH (ref 12.3–15.4)
WBC: 4.3 10*3/uL (ref 3.4–10.8)

## 2018-01-06 LAB — COMPREHENSIVE METABOLIC PANEL
ALBUMIN: 4.4 g/dL (ref 3.5–5.5)
ALK PHOS: 191 IU/L — AB (ref 39–117)
ALT: 15 IU/L (ref 0–44)
AST: 31 IU/L (ref 0–40)
Albumin/Globulin Ratio: 2.3 — ABNORMAL HIGH (ref 1.2–2.2)
BILIRUBIN TOTAL: 3.3 mg/dL — AB (ref 0.0–1.2)
BUN/Creatinine Ratio: 14 (ref 9–20)
BUN: 11 mg/dL (ref 6–24)
CHLORIDE: 109 mmol/L — AB (ref 96–106)
CO2: 20 mmol/L (ref 20–29)
Calcium: 8.8 mg/dL (ref 8.7–10.2)
Creatinine, Ser: 0.79 mg/dL (ref 0.76–1.27)
GFR calc Af Amer: 116 mL/min/{1.73_m2} (ref >59)
GFR calc non Af Amer: 100 mL/min/{1.73_m2} (ref >59)
GLOBULIN, TOTAL: 1.9 g/dL (ref 1.5–4.5)
GLUCOSE: 80 mg/dL (ref 65–99)
POTASSIUM: 3.6 mmol/L (ref 3.5–5.2)
SODIUM: 145 mmol/L — AB (ref 134–144)
TOTAL PROTEIN: 6.3 g/dL (ref 6.0–8.5)

## 2018-01-06 LAB — LACTATE DEHYDROGENASE: LDH: 311 IU/L — ABNORMAL HIGH (ref 121–224)

## 2018-01-06 LAB — RETICULOCYTES: RETIC CT PCT: 3.5 % — AB (ref 0.6–2.6)

## 2018-01-06 LAB — HAPTOGLOBIN: Haptoglobin: <10 mg/dL — ABNORMAL LOW (ref 34–200)

## 2018-01-07 LAB — URINE CULTURE

## 2018-01-12 ENCOUNTER — Telehealth: Payer: Self-pay | Admitting: Neurology

## 2018-01-12 NOTE — Telephone Encounter (Signed)
Dr. Krista Blue is happy to speak with this physician regarding this patient. However, prior to completion of this peer-to-peer discussion, we will need to obtain a medical release form.

## 2018-01-12 NOTE — Telephone Encounter (Signed)
Dr. Bridgett Larsson with [employer] is calling regarding a peer to peer about disability. He can be reached at [phone].

## 2018-01-13 NOTE — Telephone Encounter (Signed)
No action needed at this time. Thanks.

## 2018-01-13 NOTE — Telephone Encounter (Signed)
I spoke to pt, he will stop by today or tomorrow to sign a release form.

## 2018-01-13 NOTE — Telephone Encounter (Signed)
Yes I will inform the pt.

## 2018-01-13 NOTE — Telephone Encounter (Signed)
Please advise, this was routed to me while I was on vacation.

## 2018-01-14 NOTE — Telephone Encounter (Signed)
The patient came to our office and signed a medical consent form giving Dr. Krista Blue permission to discuss his medical information with Dr. Bridgett Larsson with [employer].

## 2018-01-14 NOTE — Telephone Encounter (Signed)
I called Dr. Ayesha Mohair office and had to leave a message with his assistant.  He will call Dr. Krista Blue back on her cell phone.  Our office number was also provided as a back up contact.  They are aware of our business hours.

## 2018-01-16 DIAGNOSIS — M25511 Pain in right shoulder: Secondary | ICD-10-CM | POA: Diagnosis not present

## 2018-01-23 NOTE — Patient Instructions (Addendum)
[name] [name]  01/23/2018   Your procedure is scheduled on: Monday 02/02/2018  Report to [facility] Main  Entrance              Report to admitting at  0900 AM    Call this number if you have problems the morning of surgery [phone]    Remember: Do not eat food or drink liquids :After Midnight.  Please bring CPAP mask and tubing with you to the hospital!   Take these medicines the morning of surgery with A SIP OF WATER: Lamotrigine (Lamictal), Sertraline (Zoloft), Esomeprazole (Nexium)              DO NOT TAKE ANY DIABETIC MEDICATIONS DAY OF YOUR SURGERY!                               You may not have any metal on your body including hair pins and              piercings  Do not wear jewelry, make-up, lotions, powders or perfumes, deodorant                        Men may shave face and neck.   Do not bring valuables to the hospital. [place].  Contacts, dentures or bridgework may not be worn into surgery.  Leave suitcase in the car. After surgery it may be brought to your room.                  Please read over the following fact sheets you were given: _____________________________________________________________________             [facility] - Preparing for Surgery Before surgery, you can play an important role.  Because skin is not sterile, your skin needs to be as free of germs as possible.  You can reduce the number of germs on your skin by washing with CHG (chlorahexidine gluconate) soap before surgery.  CHG is an antiseptic cleaner which kills germs and bonds with the skin to continue killing germs even after washing. Please DO NOT use if you have an allergy to CHG or antibacterial soaps.  If your skin becomes reddened/irritated stop using the CHG and inform your nurse when you arrive at Short Stay. Do not shave (including legs and underarms) for at least 48 hours prior to the first CHG shower.   You may shave your face/neck. Please follow these instructions carefully:  1.  Shower with CHG Soap the night before surgery and the  morning of Surgery.  2.  If you choose to wash your hair, wash your hair first as usual with your  normal  shampoo.  3.  After you shampoo, rinse your hair and body thoroughly to remove the  shampoo.                           4.  Use CHG as you would any other liquid soap.  You can apply chg directly  to the skin and wash                       Gently with a scrungie or  clean washcloth.  5.  Apply the CHG Soap to your body ONLY FROM THE NECK DOWN.   Do not use on face/ open                           Wound or open sores. Avoid contact with eyes, ears mouth and genitals (private parts).                       Wash face,  Genitals (private parts) with your normal soap.             6.  Wash thoroughly, paying special attention to the area where your surgery  will be performed.  7.  Thoroughly rinse your body with warm water from the neck down.  8.  DO NOT shower/wash with your normal soap after using and rinsing off  the CHG Soap.                9.  Pat yourself dry with a clean towel.            10.  Wear clean pajamas.            11.  Place clean sheets on your bed the night of your first shower and do not  sleep with pets. Day of Surgery : Do not apply any lotions/deodorants the morning of surgery.  Please wear clean clothes to the hospital/surgery center.  FAILURE TO FOLLOW THESE INSTRUCTIONS MAY RESULT IN THE CANCELLATION OF YOUR SURGERY PATIENT SIGNATURE_________________________________  NURSE SIGNATURE__________________________________  ________________________________________________________________________

## 2018-01-24 DIAGNOSIS — M25511 Pain in right shoulder: Secondary | ICD-10-CM | POA: Diagnosis not present

## 2018-01-26 ENCOUNTER — Encounter (HOSPITAL_COMMUNITY)
Admission: RE | Admit: 2018-01-26 | Discharge: 2018-01-26 | Disposition: A | Discharge status: Home / Self Care | Payer: 59 | Source: Ambulatory Visit | Attending: Urology | Admitting: Urology

## 2018-01-26 ENCOUNTER — Encounter (HOSPITAL_COMMUNITY): Payer: Self-pay

## 2018-01-26 ENCOUNTER — Other Ambulatory Visit: Payer: Self-pay

## 2018-01-26 DIAGNOSIS — Z79899 Other long term (current) drug therapy: Secondary | ICD-10-CM | POA: Diagnosis not present

## 2018-01-26 DIAGNOSIS — K219 Gastro-esophageal reflux disease without esophagitis: Secondary | ICD-10-CM | POA: Insufficient documentation

## 2018-01-26 DIAGNOSIS — N202 Calculus of kidney with calculus of ureter: Secondary | ICD-10-CM | POA: Insufficient documentation

## 2018-01-26 DIAGNOSIS — E119 Type 2 diabetes mellitus without complications: Secondary | ICD-10-CM | POA: Insufficient documentation

## 2018-01-26 DIAGNOSIS — F419 Anxiety disorder, unspecified: Secondary | ICD-10-CM | POA: Diagnosis not present

## 2018-01-26 DIAGNOSIS — D649 Anemia, unspecified: Secondary | ICD-10-CM | POA: Diagnosis not present

## 2018-01-26 DIAGNOSIS — F329 Major depressive disorder, single episode, unspecified: Secondary | ICD-10-CM | POA: Diagnosis not present

## 2018-01-26 DIAGNOSIS — Z01812 Encounter for preprocedural laboratory examination: Secondary | ICD-10-CM | POA: Insufficient documentation

## 2018-01-26 LAB — COMPREHENSIVE METABOLIC PANEL
ALK PHOS: 216 U/L — AB (ref 38–126)
ALT: 21 U/L (ref 0–44)
ANION GAP: 8 (ref 5–15)
AST: 37 U/L (ref 15–41)
Albumin: 4.2 g/dL (ref 3.5–5.0)
BUN: 14 mg/dL (ref 6–20)
CO2: 25 mmol/L (ref 22–32)
Calcium: 9.2 mg/dL (ref 8.9–10.3)
Chloride: 112 mmol/L — ABNORMAL HIGH (ref 98–111)
Creatinine, Ser: 0.73 mg/dL (ref 0.61–1.24)
Glucose, Bld: 103 mg/dL — ABNORMAL HIGH (ref 70–99)
Potassium: 3.8 mmol/L (ref 3.5–5.1)
SODIUM: 145 mmol/L (ref 135–145)
Total Bilirubin: 2.7 mg/dL — ABNORMAL HIGH (ref 0.3–1.2)
Total Protein: 6.8 g/dL (ref 6.5–8.1)

## 2018-01-26 LAB — CBC
HCT: 34.6 % — ABNORMAL LOW (ref 39.0–52.0)
HEMOGLOBIN: 12.1 g/dL — AB (ref 13.0–17.0)
MCH: 38.8 pg — AB (ref 26.0–34.0)
MCHC: 35 g/dL (ref 30.0–36.0)
MCV: 110.9 fL — ABNORMAL HIGH (ref 78.0–100.0)
Platelets: 97 10*3/uL — ABNORMAL LOW (ref 150–400)
RBC: 3.12 MIL/uL — AB (ref 4.22–5.81)
RDW: 15.8 % — ABNORMAL HIGH (ref 11.5–15.5)
WBC: 3.9 10*3/uL — AB (ref 4.0–10.5)

## 2018-01-26 LAB — GLUCOSE, CAPILLARY: Glucose-Capillary: 102 mg/dL — ABNORMAL HIGH (ref 70–99)

## 2018-01-26 LAB — HEMOGLOBIN A1C
Hgb A1c MFr Bld: 4 % — ABNORMAL LOW (ref 4.8–5.6)
MEAN PLASMA GLUCOSE: 68.1 mg/dL

## 2018-01-27 LAB — SURGICAL PCR SCREEN
MRSA, PCR: NEGATIVE
Staphylococcus aureus: POSITIVE — AB

## 2018-01-27 NOTE — Pre-Procedure Instructions (Signed)
Muprirocin called in to [place] at [facility].  Mrs. Plessinger (Azel's wife) made aware and verbalized understanding.

## 2018-01-28 DIAGNOSIS — M25511 Pain in right shoulder: Secondary | ICD-10-CM | POA: Diagnosis not present

## 2018-01-29 ENCOUNTER — Other Ambulatory Visit: Payer: Self-pay | Admitting: Gastroenterology

## 2018-01-30 ENCOUNTER — Other Ambulatory Visit: Payer: Self-pay | Admitting: Radiology

## 2018-02-01 DIAGNOSIS — G4733 Obstructive sleep apnea (adult) (pediatric): Secondary | ICD-10-CM | POA: Diagnosis not present

## 2018-02-01 NOTE — Discharge Instructions (Signed)
Post percutaneous nephrolithotomy and stent  placement instructions   Definitions:  Ureter: The duct that transports urine from the kidney to the bladder. Stent: A plastic hollow tube that is placed into the ureter, from the kidney to the bladder to prevent the ureter from swelling shut.  General instructions:  Despite the fact that only a small skin incision was used, the area around the kidney, ureter and bladder is raw and irritated. The stent is a foreign body which can further irritate the bladder wall. This irritation is manifested by increased frequency of urination, both day and night, and by an increase in the urge to urinate. In some, the urge to urinate is present almost always. Sometimes the urge is strong enough that you may not be able to stop your self from urinating. This can often be controlled with medication but does not occur in everyone. A stent can safely be left in place for 3 months or greater.  You may see some blood in your urine while the stent is in place and a few days afterward. Do not be alarmed, even if the urine is clear for a while. Get off your feet and drink lots of fluids until clearing occurs. If you start to pass clots or don't improve, call us.  Diet:  You may return to your normal diet immediately. Because of the raw surface of your bladder, alcohol, spicy foods, foods high in acid and drinks with caffeine may cause irritation or frequency and should be used in moderation. To keep your urine flowing freely and avoid constipation, drink plenty of fluids during the day (8-10 glasses). Tip: Avoid cranberry juice because it is very acidic.  Activity:  Your physical activity doesn't need to be restricted. However, if you are very active, you may see some blood in the urine. We suggest that you reduce your activity under the circumstances until the bleeding has stopped.  Bowels:  It is important to keep your bowels regular during the postoperative period.  Straining with bowel movements can cause bleeding. A bowel movement every other day is reasonable. Use a mild laxative if needed, such as milk of magnesia 2-3 tablespoons, or 2 Dulcolax tablets. Call if you continue to have problems. If you had been taking narcotics for pain, before, during or after your surgery, you may be constipated. Take a laxative if necessary.  Medication:  You should resume your pre-surgery medications unless told not to. In addition you may be given an antibiotic to prevent or treat infection. Antibiotics are not always necessary. All medication should be taken as prescribed until the bottles are finished unless you are having an unusual reaction to one of the drugs.  Problems you should report to [place]:  a. Fever greater than 101F. b. Heavy bleeding, or clots (see notes above about blood in urine). c. Inability to urinate. d. Drug reactions (hives, rash, nausea, vomiting, diarrhea). e. Severe burning or pain with urination that is not improving.

## 2018-02-01 NOTE — H&P (Signed)
HPI: [name] [name] is a 57 year-old male who presents for PCNL for left renal calculi.  The problem is on the left side. He has had no symptoms. This is not his first kidney stone. He has not caught a stone in his urine strainer since his symptoms began.   He has had [facility] for treatment of his stones in the past. This condition would be considered of mild to moderate severity with no modifying factors or associated signs or symptoms other than as noted above.   Left renal calculi: CT scan done in 3/12 revealed 2 stones within the left kidney that were peripherally located and not resulting in any obstruction.  CT scan 5/14 - single stone seen in the periphery of the left kidney, no right renal calculi and a single cyst in the left kidney.  CT scan 11/16 - 4 left renal calculi and no right renal calculi.   Interval history 05/28/16: Since I had seen him last he has passed several stones. He is not having any symptoms currently. He said he has developed seizures and is no longer able to drive.   12/15/17: A CT scan had revealed an area in the right kidney in addition to 3 unchanged stones in his left kidney when compared to his previous CT scan. He has returned for repeat CT scan to evaluate the kidney and the left renal calculi.  He reports to me today that he has been having some mild dysuria he said it has been going on for about 3 weeks. He said it feels similar to what he had before he developed staph sepsis.      CC: I have a renal mass.  HPI: The mass is on the right side. His mass was found 05/05/2017. He had the following imaging studies done: CT Scan and MRI Scan. He has had no symptoms. He has not seen blood in his urine. He is not having new bone pain. He has not recently had unwanted weight loss.   He has not had kidney surgery.   06/12/17: He underwent a CT scan on 05/05/17 which revealed his known left renal cyst and unchanged left renal calculi however there was a subtle 2.3 cm lesion  in the medial aspect of the right kidney. This was followed up with an MRI scan with and without contrast on 05/24/17 which did reveal the lesion however due to the patient's inability to hold his breath there was motion degradation and therefore a with and without contrast CT scan of the kidneys was performed on 06/10/17.  He indicates that he had developed staph septicemia.   12/15/17: He underwent a follow-up CT scan to evaluate a possible mass in the right kidney and has returned today to go over the results.     ALLERGIES: Dilaudid TABS Penicillins Sulfa Drugs Zithromax PACK    MEDICATIONS: Calcium 600 + D TABS Oral  Diltiazem Er 180 mg capsule,extended-release 24hr degradable Oral  Eszopiclone 3 mg tablet  Florastor CAPS Oral  Folic Acid 1 mg tablet Oral  Iron 236 mg (27 mg iron) tablet Oral  Lamotrigine 100 mg tablet  Lialda 1.2 gram tablet, delayed release Oral  Mercaptopurine 50 mg tablet Oral  Methscopolamine Bromide 5 mg tablet Oral  Nexium 40 mg capsule,delayed release Oral  Potassium Chloride 20 meq tablet, ext release, particles/crystals Oral  Sertraline Hcl 100 mg tablet     GU PSH: [facility] - 2011 Locm 300-399Mg/Ml Iodine,1Ml - 12/12/2017      PSH  Notes: Lithotripsy, Shoulder Surgery, Nose Surgery   NON-GU PSH: None   GU PMH: Encounter for Prostate Cancer screening (Stable), His prostate is noted to be smooth and benign. His PSA remains quite low at 0.59. I will continue to follow him with annual DRE and PSA. - 06/12/2017 Renal calculus (Stable), Left, The 3 stones in his left kidney appear to be unchanged when compared to his CT scan done 1 year ago. No new stones have developed. These will be reimaged when I reimage his kidneys again in 6 months. - 06/12/2017 Microscopic hematuria (Stable), He had microscopic hematuria again noted today. I will send urine for cytology. - 05/28/2016 Renal cyst (Stable), Left, Renal cysts are again notified on his CT scan. They appear  simple and of no clinical significance. - 05/28/2016      PMH Notes: Left renal cysts: He appeared to have 2 left renal cyst by CT scan in 3/12 however a CT scan in 5/14 revealed what appeared to be only one cyst.   He has microscopic hematuria that has been persistent over time. This was fully worked up in 1/11 with the finding of a normal creatinine, normal cystoscopy and a CT scan that revealed 3 left renal calculi and 4 right renal cysts. His NMP 22 was positive however a followup urine cytology was negative.   Gross hematuria: He began experiencing painless, gross hematuria approximately In 11/16. He also had noted increased urinary frequency, urgency and nocturia.  CT scan and cystoscopy 11/16 - negative other than the finding of a renal calculus.   Question of right renal mass: He underwent a CT scan on 05/05/17 which revealed his known left renal cyst and unchanged left renal calculi however there was a subtle 2.3 cm lesion in the medial aspect of the right kidney.  MRI scan with and without contrast on 05/24/17 which did reveal the lesion however due to the patient's inability to hold his breath there was motion degradation.  CT scan with and without contrast 06/10/17: No evidence of right renal mass whatsoever.     NON-GU PMH: Encounter for general adult medical examination without abnormal findings, Encounter for preventive health examination - 2016 [name], Crohn's Disease - 2014 Personal history of other diseases of the circulatory system, History of hypertension - 2014    FAMILY HISTORY: Alzheimer's Disease - Mother Death In The Family Father - Father Death In The Family Mother - Mother Family Health Status Number - Runs In Family Transient Ischemic Attack - Father   SOCIAL HISTORY: Marital Status: Married Preferred Language: English; Ethnicity: Not Hispanic Or Latino; Race: White     Notes: Never A Smoker, Tobacco Use, Caffeine Use, Marital History - Currently  Married, Occupation:, Alcohol Use   REVIEW OF SYSTEMS:    GU Review Male:   Patient reports burning/ pain with urination. Patient denies frequent urination, hard to postpone urination, get up at night to urinate, leakage of urine, stream starts and stops, trouble starting your stream, have to strain to urinate , erection problems, and penile pain.  Gastrointestinal (Upper):   Patient denies nausea, vomiting, and indigestion/ heartburn.  Gastrointestinal (Lower):   Patient denies diarrhea and constipation.  Constitutional:   Patient denies fever, night sweats, weight loss, and fatigue.  Skin:   Patient denies skin rash/ lesion and itching.  Eyes:   Patient denies blurred vision and double vision.  Ears/ Nose/ Throat:   Patient denies sore throat and sinus problems.  Hematologic/Lymphatic:   Patient denies  swollen glands and easy bruising.  Cardiovascular:   Patient denies leg swelling and chest pains.  Respiratory:   Patient denies cough and shortness of breath.  Endocrine:   Patient denies excessive thirst.  Musculoskeletal:   Patient denies back pain and joint pain.  Neurological:   Patient denies headaches and dizziness.  Psychologic:   Patient denies depression and anxiety.   VITAL SIGNS:    Weight 185 lb / 83.91 kg  Height 71 in / 180.34 cm  BP 110/71 mmHg  Pulse 106 /min  BMI 25.8 kg/m      Physical Exam  Constitutional: Well nourished and well developed. No acute distress.   ENT:. The ears and nose are normal in appearance.   Neck: The appearance of the neck is normal and no neck mass is present.   Pulmonary: No respiratory distress and normal respiratory rhythm and effort.   Cardiovascular: Heart rate and rhythm are normal. No peripheral edema.   Abdomen: The abdomen is soft and nontender. No masses are palpated. No CVA tenderness. No hernias are palpable. No hepatosplenomegaly noted.   Lymphatics: The femoral and inguinal nodes are not enlarged or tender.   Skin:  Normal skin turgor, no visible rash and no visible skin lesions.   Neuro/Psych:. Mood and affect are appropriate.     PAST DATA REVIEWED:  Source Of History:  Patient  Lab Test Review:   BUN/Creatinine, Calcium  Records Review:   Previous Patient Records, POC Tool  X-Ray Review: C.T. Abdomen/Pelvis: Reviewed Films. Reviewed Report. Discussed With Patient. EXAM: CT ABDOMEN WITHOUT AND WITH CONTRAST TECHNIQUE: Multidetector CT imaging of the abdomen was performed following the standard protocol before and following the bolus administration of intravenous contrast. CONTRAST: 125 cc of Omnipaque 300 COMPARISON: 06/10/2017. FINDINGS: Lower chest: No acute abnormality. Hepatobiliary: No focal liver abnormality identified. The liver appears cirrhotic as before. The gallbladder appears normal. No biliary dilatation. Pancreas: Unremarkable. No pancreatic ductal dilatation or surrounding inflammatory changes. Spleen: The spleen measures 6.6 by 13.9 by 16.6 cm (volume = 800 cm^3). No focal splenic abnormality noted. Adrenals/Urinary Tract: The adrenal glands are normal. Tiny stone within the interpolar right kidney measures 5 mm. Multiple left renal calculi are identified. In the interpolar left kidney there is a stone measuring 7 mm. Stone within the lower pole of left kidney measures 1.3 cm. At the left UPJ there is a stone measuring 9 mm which causes mild left hydronephrosis. Simple appearing cyst arising from the upper pole of left kidney measures 2.3 cm, image 66/6. Simple appearing cyst arising from the lateral cortex of the left kidney measures 2.9 cm, image 81/6. Additional small low density foci within both kidneys measure less than 1 cm and are too small to reliably characterize. Stomach/Bowel: Stomach is within normal limits. Appendix appears normal. No evidence of bowel wall thickening, distention, or inflammatory changes. Vascular/Lymphatic: Aortic atherosclerosis without aneurysm. The portal vein  appears enlarged but patent. Hepatic veins are patent. Splenic varices noted. No upper abdominal adenopathy. Other: No abdominal wall hernia or abnormality. Musculoskeletal: Degenerative disc disease identified within the lumbar spine. Multiple chronic right rib fracture deformities are identified IMPRESSION: 1. Left UPJ calculus measures 9 mm and causes mild left hydronephrosis. 2. Multiple left renal calculi. 3. Left kidney cysts compatible with Bosniak category 1 lesions. 4. Cirrhosis with stigmata of portal venous hypertension including enlargement of the portal vein and splenomegaly. 5. Aortic Atherosclerosis     06/05/17 05/28/16 05/04/15 06/27/09  PSA  Total PSA 0.59  ng/mL 0.24  0.35  0.34    Notes:                     A creatinine in 5/19 was 1.24 his calcium level was normal at that time at 9.3.    PROCEDURES:          Urinalysis w/Scope Dipstick Dipstick Cont'd Micro  Color: Amber Bilirubin: Neg WBC/hpf: >60/hpf  Appearance: Cloudy Ketones: Neg RBC/hpf: 20 - 40/hpf  Specific Gravity: 1.025 Blood: 2+ Bacteria: Few (10-25/hpf)  pH: 6.5 Protein: 2+ Cystals: NS (Not Seen)  Glucose: Neg Urobilinogen: 2.0 Casts: NS (Not Seen)    Nitrites: Neg Trichomonas: Not Present    Leukocyte Esterase: 3+ Mucous: Present      Epithelial Cells: 0 - 5/hpf      Yeast: NS (Not Seen)      Sperm: Not Present    Notes: rte's noted    ASSESSMENT/PLAN:     ICD-10 Details  1 GU:   Ureteral calculus - N20.1 Left, Acute - He now has a stone in his proximal right ureter just below the UPJ measuring 8 mm in width with Hounsfield units of 1200. There is associated very mild left hydronephrosis.  2   Renal calculus - N20.0 Bilateral, Stable - He has a stone in the upper pole and in the lower pole of his left kidney. These remain stable and are nonobstructing. He does have hydronephrosis secondary to a stone but it is relatively mild. There was also a 5 mm stone in the upper pole of the right kidney.  3   Renal  cyst - N28.1 Left, Stable - His left renal cyst is again noted and is a simple cyst.  4   Right renal neoplasm - D49.511 Right, Improving - There was no evidence of right renal neoplasm noted on his CT scan. This appears to have been a flow artifact or some form of inflammation due to his previous sepsis.  5 NON-GU:   Pyuria/other UA findings - R82.79 He has been having some dysuria and did have some bacteria as well as white blood cells in the urine. In preparation for his surgery his urine will be cultured.              Notes:   We discussed the management of urinary stones. These options include observation, ureteroscopy, shockwave lithotripsy, and PCNL. We discussed which options are relevant to these particular stones. We discussed the natural history of stones as well as the complications of untreated stones and the impact on quality of life without treatment as well as with each of the above listed treatments. We also discussed the efficacy of each treatment in its ability to clear the stone burden. With any of these management options I discussed the signs and symptoms of infection and the need for emergent treatment should these be experienced. For each option we discussed the ability of each procedure to clear the patient of their stone burden.   For observation I described the risks which include but are not limited to silent renal damage, life-threatening infection, need for emergent surgery, failure to pass stone, and pain.   For ureteroscopy I described the risks which include heart attack, stroke, pulmonary embolus, death, bleeding, infection, damage to contiguous structures, positioning injury, ureteral stricture, ureteral avulsion, ureteral injury, need for ureteral stent, inability to perform ureteroscopy, need for an interval procedure, inability to clear stone burden, stent discomfort and pain.   For shockwave lithotripsy  I described the risks which include arrhythmia, kidney  contusion, kidney hemorrhage, need for transfusion, long-term risk of diabetes or hypertension, back discomfort, flank ecchymosis, flank abrasion, inability to break up stone, inability to pass stone fragments, Steinstrasse, infection associated with obstructing stones, need for different surgical procedure, need for repeat shockwave lithotripsy, and death.   For PCNL I described the risks including heart attack, sure, pulmonary embolus, death, positioning injury, pneumothorax, hydrothorax, need for chest tube, inability to clear stone burden, renal laceration, arterial venous fistula or malformation, need for embolization of kidney, loss of kidney or renal function, need for repeat procedure, need for prolonged nephrostomy tube, ureteral avulsion, fistula.   He has had lithotripsy in the past. He said it was uncomfortable trying to pass the stone fragments and he wanted to have all of his stones completely removed and therefore elected to proceed with a PCNL.

## 2018-02-02 ENCOUNTER — Ambulatory Visit (HOSPITAL_COMMUNITY): Payer: 59 | Admitting: Anesthesiology

## 2018-02-02 ENCOUNTER — Other Ambulatory Visit: Payer: Self-pay

## 2018-02-02 ENCOUNTER — Encounter (HOSPITAL_COMMUNITY): Payer: Self-pay

## 2018-02-02 ENCOUNTER — Ambulatory Visit (HOSPITAL_COMMUNITY)
Admission: RE | Admit: 2018-02-02 | Discharge: 2018-02-02 | Disposition: A | Discharge status: Home / Self Care | Payer: 59 | Source: Ambulatory Visit | Attending: Urology | Admitting: Urology

## 2018-02-02 ENCOUNTER — Ambulatory Visit (HOSPITAL_COMMUNITY): Payer: 59

## 2018-02-02 ENCOUNTER — Ambulatory Visit (HOSPITAL_COMMUNITY)
Admission: RE | Admit: 2018-02-02 | Discharge: 2018-02-03 | Disposition: A | Discharge status: Home / Self Care | Payer: 59 | Source: Ambulatory Visit | Attending: Urology | Admitting: Urology

## 2018-02-02 ENCOUNTER — Encounter (HOSPITAL_COMMUNITY)
Admission: RE | Disposition: A | Discharge status: Home / Self Care | Payer: Self-pay | Source: Ambulatory Visit | Attending: Urology

## 2018-02-02 DIAGNOSIS — K219 Gastro-esophageal reflux disease without esophagitis: Secondary | ICD-10-CM | POA: Diagnosis not present

## 2018-02-02 DIAGNOSIS — N1330 Unspecified hydronephrosis: Secondary | ICD-10-CM | POA: Diagnosis not present

## 2018-02-02 DIAGNOSIS — N200 Calculus of kidney: Secondary | ICD-10-CM

## 2018-02-02 DIAGNOSIS — D49511 Neoplasm of unspecified behavior of right kidney: Secondary | ICD-10-CM | POA: Diagnosis not present

## 2018-02-02 DIAGNOSIS — K5080 Crohn's disease of both small and large intestine without complications: Secondary | ICD-10-CM | POA: Diagnosis not present

## 2018-02-02 DIAGNOSIS — Z9841 Cataract extraction status, right eye: Secondary | ICD-10-CM | POA: Diagnosis not present

## 2018-02-02 DIAGNOSIS — Z87442 Personal history of urinary calculi: Secondary | ICD-10-CM | POA: Diagnosis not present

## 2018-02-02 DIAGNOSIS — R569 Unspecified convulsions: Secondary | ICD-10-CM | POA: Insufficient documentation

## 2018-02-02 DIAGNOSIS — Z91018 Allergy to other foods: Secondary | ICD-10-CM | POA: Insufficient documentation

## 2018-02-02 DIAGNOSIS — Z8371 Family history of colonic polyps: Secondary | ICD-10-CM | POA: Diagnosis not present

## 2018-02-02 DIAGNOSIS — Z9842 Cataract extraction status, left eye: Secondary | ICD-10-CM | POA: Diagnosis not present

## 2018-02-02 DIAGNOSIS — E119 Type 2 diabetes mellitus without complications: Secondary | ICD-10-CM | POA: Insufficient documentation

## 2018-02-02 DIAGNOSIS — Z885 Allergy status to narcotic agent status: Secondary | ICD-10-CM | POA: Insufficient documentation

## 2018-02-02 DIAGNOSIS — Z8614 Personal history of Methicillin resistant Staphylococcus aureus infection: Secondary | ICD-10-CM | POA: Insufficient documentation

## 2018-02-02 DIAGNOSIS — Z79899 Other long term (current) drug therapy: Secondary | ICD-10-CM | POA: Insufficient documentation

## 2018-02-02 DIAGNOSIS — F329 Major depressive disorder, single episode, unspecified: Secondary | ICD-10-CM | POA: Insufficient documentation

## 2018-02-02 DIAGNOSIS — Z823 Family history of stroke: Secondary | ICD-10-CM | POA: Diagnosis not present

## 2018-02-02 DIAGNOSIS — Z882 Allergy status to sulfonamides status: Secondary | ICD-10-CM | POA: Insufficient documentation

## 2018-02-02 DIAGNOSIS — Z881 Allergy status to other antibiotic agents status: Secondary | ICD-10-CM | POA: Insufficient documentation

## 2018-02-02 DIAGNOSIS — Z85828 Personal history of other malignant neoplasm of skin: Secondary | ICD-10-CM | POA: Insufficient documentation

## 2018-02-02 DIAGNOSIS — N281 Cyst of kidney, acquired: Secondary | ICD-10-CM | POA: Insufficient documentation

## 2018-02-02 DIAGNOSIS — I10 Essential (primary) hypertension: Secondary | ICD-10-CM | POA: Insufficient documentation

## 2018-02-02 DIAGNOSIS — Z8249 Family history of ischemic heart disease and other diseases of the circulatory system: Secondary | ICD-10-CM | POA: Insufficient documentation

## 2018-02-02 DIAGNOSIS — Z880 Allergy status to penicillin: Secondary | ICD-10-CM | POA: Insufficient documentation

## 2018-02-02 DIAGNOSIS — N132 Hydronephrosis with renal and ureteral calculous obstruction: Secondary | ICD-10-CM | POA: Insufficient documentation

## 2018-02-02 DIAGNOSIS — G4730 Sleep apnea, unspecified: Secondary | ICD-10-CM | POA: Insufficient documentation

## 2018-02-02 DIAGNOSIS — F4310 Post-traumatic stress disorder, unspecified: Secondary | ICD-10-CM | POA: Insufficient documentation

## 2018-02-02 DIAGNOSIS — D649 Anemia, unspecified: Secondary | ICD-10-CM | POA: Insufficient documentation

## 2018-02-02 HISTORY — PX: NEPHROLITHOTOMY: SHX5134

## 2018-02-02 HISTORY — PX: IR URETERAL STENT LEFT NEW ACCESS W/O SEP NEPHROSTOMY CATH: IMG6075

## 2018-02-02 LAB — CBC WITH DIFFERENTIAL/PLATELET
Basophils Absolute: 0 10*3/uL (ref 0.0–0.1)
Basophils Relative: 1 %
Eosinophils Absolute: 0.4 10*3/uL (ref 0.0–0.7)
Eosinophils Relative: 11 %
HEMATOCRIT: 32.5 % — AB (ref 39.0–52.0)
Hemoglobin: 11.5 g/dL — ABNORMAL LOW (ref 13.0–17.0)
LYMPHS PCT: 21 %
Lymphs Abs: 0.7 10*3/uL (ref 0.7–4.0)
MCH: 38.7 pg — ABNORMAL HIGH (ref 26.0–34.0)
MCHC: 35.4 g/dL (ref 30.0–36.0)
MCV: 109.4 fL — AB (ref 78.0–100.0)
MONO ABS: 0.4 10*3/uL (ref 0.1–1.0)
Monocytes Relative: 12 %
NEUTROS ABS: 2 10*3/uL (ref 1.7–7.7)
Neutrophils Relative %: 55 %
Platelets: 88 10*3/uL — ABNORMAL LOW (ref 150–400)
RBC: 2.97 MIL/uL — ABNORMAL LOW (ref 4.22–5.81)
RDW: 15.3 % (ref 11.5–15.5)
WBC: 3.6 10*3/uL — ABNORMAL LOW (ref 4.0–10.5)

## 2018-02-02 LAB — BASIC METABOLIC PANEL
ANION GAP: 6 (ref 5–15)
BUN: 11 mg/dL (ref 6–20)
CO2: 25 mmol/L (ref 22–32)
Calcium: 8.9 mg/dL (ref 8.9–10.3)
Chloride: 113 mmol/L — ABNORMAL HIGH (ref 98–111)
Creatinine, Ser: 0.86 mg/dL (ref 0.61–1.24)
GFR calc Af Amer: >60 mL/min (ref >60)
GFR calc non Af Amer: >60 mL/min (ref >60)
GLUCOSE: 109 mg/dL — AB (ref 70–99)
Potassium: 3.4 mmol/L — ABNORMAL LOW (ref 3.5–5.1)
Sodium: 144 mmol/L (ref 135–145)

## 2018-02-02 LAB — GLUCOSE, CAPILLARY: Glucose-Capillary: 101 mg/dL — ABNORMAL HIGH (ref 70–99)

## 2018-02-02 LAB — PROTIME-INR
INR: 1.3
Prothrombin Time: 16.1 seconds — ABNORMAL HIGH (ref 11.4–15.2)

## 2018-02-02 SURGERY — NEPHROLITHOTOMY PERCUTANEOUS
Anesthesia: General | Laterality: Left

## 2018-02-02 MED ORDER — ONDANSETRON HCL 4 MG/2ML IJ SOLN
INTRAMUSCULAR | Status: AC
  Administered 2018-02-02: 4 mg
  Filled 2018-02-02: qty 2

## 2018-02-02 MED ORDER — PROPOFOL 10 MG/ML IV BOLUS
INTRAVENOUS | Status: DC | PRN
  Administered 2018-02-02: 130 mg via INTRAVENOUS

## 2018-02-02 MED ORDER — BELLADONNA ALKALOIDS-OPIUM 16.2-60 MG RE SUPP
RECTAL | Status: AC
  Filled 2018-02-02: qty 1

## 2018-02-02 MED ORDER — PROMETHAZINE HCL 25 MG/ML IJ SOLN
6.2500 mg | INTRAMUSCULAR | Status: DC | PRN
  Administered 2018-02-02: 6.25 mg via INTRAVENOUS

## 2018-02-02 MED ORDER — SODIUM CHLORIDE 0.9 % IR SOLN
Status: DC | PRN
Start: 2018-02-02 — End: 2018-02-02
  Administered 2018-02-02: 12000 mL

## 2018-02-02 MED ORDER — ESZOPICLONE 3 MG PO TABS
3.0000 mg | ORAL_TABLET | Freq: Every day | ORAL | Status: DC
  Administered 2018-02-02: 3 mg via ORAL

## 2018-02-02 MED ORDER — OXYBUTYNIN CHLORIDE 5 MG PO TABS
5.0000 mg | ORAL_TABLET | Freq: Three times a day (TID) | ORAL | Status: DC | PRN
  Administered 2018-02-02: 5 mg via ORAL
  Filled 2018-02-02: qty 1

## 2018-02-02 MED ORDER — CIPROFLOXACIN IN D5W 400 MG/200ML IV SOLN: 400.0000 mg | INTRAVENOUS | Status: DC

## 2018-02-02 MED ORDER — BELLADONNA ALKALOIDS-OPIUM 16.2-60 MG RE SUPP
RECTAL | Status: AC
  Administered 2018-02-02: 1 via RECTAL
  Filled 2018-02-02: qty 1

## 2018-02-02 MED ORDER — ACETAMINOPHEN 325 MG PO TABS: 650.0000 mg | ORAL_TABLET | ORAL | Status: DC | PRN

## 2018-02-02 MED ORDER — PROMETHAZINE HCL 25 MG/ML IJ SOLN
INTRAMUSCULAR | Status: AC
Start: 2018-02-02 — End: 2018-02-03
  Filled 2018-02-02: qty 1

## 2018-02-02 MED ORDER — IOPAMIDOL (ISOVUE-300) INJECTION 61%
INTRAVENOUS | Status: AC
  Administered 2018-02-02: 20 mL
  Filled 2018-02-02: qty 50

## 2018-02-02 MED ORDER — BELLADONNA ALKALOIDS-OPIUM 16.2-60 MG RE SUPP
1.0000 | Freq: Four times a day (QID) | RECTAL | Status: DC | PRN
  Administered 2018-02-02 (×2): 1 via RECTAL
  Filled 2018-02-02: qty 1

## 2018-02-02 MED ORDER — SODIUM CHLORIDE 0.9 % IV SOLN
INTRAVENOUS | Status: DC
  Administered 2018-02-02 – 2018-02-03 (×2): via INTRAVENOUS

## 2018-02-02 MED ORDER — HEMOSTATIC AGENTS (NO CHARGE) OPTIME
TOPICAL | Status: DC | PRN
  Administered 2018-02-02: 1 via TOPICAL

## 2018-02-02 MED ORDER — KETOROLAC TROMETHAMINE 30 MG/ML IJ SOLN: 30.0000 mg | Freq: Once | INTRAMUSCULAR | Status: DC | PRN

## 2018-02-02 MED ORDER — NON FORMULARY: 3.0000 mg | Freq: Every day | Status: DC

## 2018-02-02 MED ORDER — FENTANYL CITRATE (PF) 250 MCG/5ML IJ SOLN
INTRAMUSCULAR | Status: AC
  Filled 2018-02-02: qty 5

## 2018-02-02 MED ORDER — ROCURONIUM BROMIDE 10 MG/ML (PF) SYRINGE
PREFILLED_SYRINGE | INTRAVENOUS | Status: AC
  Filled 2018-02-02: qty 10

## 2018-02-02 MED ORDER — FENTANYL CITRATE (PF) 100 MCG/2ML IJ SOLN
25.0000 ug | INTRAMUSCULAR | Status: DC | PRN
  Administered 2018-02-02 – 2018-02-03 (×3): 50 ug via INTRAVENOUS
  Filled 2018-02-02 (×3): qty 2

## 2018-02-02 MED ORDER — CIPROFLOXACIN IN D5W 400 MG/200ML IV SOLN
400.0000 mg | Freq: Two times a day (BID) | INTRAVENOUS | Status: AC
  Administered 2018-02-02 – 2018-02-03 (×2): 400 mg via INTRAVENOUS
  Filled 2018-02-02 (×2): qty 200

## 2018-02-02 MED ORDER — MIRTAZAPINE 15 MG PO TABS
30.0000 mg | ORAL_TABLET | Freq: Every day | ORAL | Status: DC
  Administered 2018-02-02: 30 mg via ORAL
  Filled 2018-02-02: qty 2

## 2018-02-02 MED ORDER — FENTANYL CITRATE (PF) 250 MCG/5ML IJ SOLN
INTRAMUSCULAR | Status: DC | PRN
  Administered 2018-02-02: 150 ug via INTRAVENOUS
  Administered 2018-02-02: 50 ug via INTRAVENOUS

## 2018-02-02 MED ORDER — SUGAMMADEX SODIUM 200 MG/2ML IV SOLN
INTRAVENOUS | Status: AC
  Filled 2018-02-02: qty 2

## 2018-02-02 MED ORDER — ONDANSETRON HCL 4 MG/2ML IJ SOLN
INTRAMUSCULAR | Status: AC
  Filled 2018-02-02: qty 2

## 2018-02-02 MED ORDER — LIDOCAINE 2% (20 MG/ML) 5 ML SYRINGE
INTRAMUSCULAR | Status: AC
  Filled 2018-02-02: qty 5

## 2018-02-02 MED ORDER — IOPAMIDOL (ISOVUE-300) INJECTION 61%
INTRAVENOUS | Status: AC
  Administered 2018-02-02: 50 mL
  Filled 2018-02-02: qty 50

## 2018-02-02 MED ORDER — LACTATED RINGERS IV SOLN
INTRAVENOUS | Status: DC | PRN
  Administered 2018-02-02: 13:00:00 via INTRAVENOUS

## 2018-02-02 MED ORDER — IOHEXOL 300 MG/ML  SOLN
INTRAMUSCULAR | Status: DC | PRN
  Administered 2018-02-02: 17 mL

## 2018-02-02 MED ORDER — CEFAZOLIN SODIUM-DEXTROSE 2-4 GM/100ML-% IV SOLN
2.0000 g | Freq: Once | INTRAVENOUS | Status: AC
  Administered 2018-02-02: 2 g via INTRAVENOUS
  Filled 2018-02-02: qty 100

## 2018-02-02 MED ORDER — ROCURONIUM BROMIDE 10 MG/ML (PF) SYRINGE
PREFILLED_SYRINGE | INTRAVENOUS | Status: DC | PRN
  Administered 2018-02-02: 50 mg via INTRAVENOUS

## 2018-02-02 MED ORDER — FENTANYL CITRATE (PF) 100 MCG/2ML IJ SOLN
INTRAMUSCULAR | Status: AC | PRN
  Administered 2018-02-02 (×2): 50 ug via INTRAVENOUS

## 2018-02-02 MED ORDER — HYDROMORPHONE HCL 1 MG/ML IJ SOLN: 0.2500 mg | INTRAMUSCULAR | Status: DC | PRN

## 2018-02-02 MED ORDER — LIDOCAINE HCL 1 % IJ SOLN
INTRAMUSCULAR | Status: AC
  Filled 2018-02-02: qty 20

## 2018-02-02 MED ORDER — DEXAMETHASONE SODIUM PHOSPHATE 10 MG/ML IJ SOLN
INTRAMUSCULAR | Status: AC
  Filled 2018-02-02: qty 1

## 2018-02-02 MED ORDER — IOPAMIDOL (ISOVUE-300) INJECTION 61%
50.0000 mL | Freq: Once | INTRAVENOUS | Status: AC | PRN
  Administered 2018-02-02: 20 mL

## 2018-02-02 MED ORDER — MIDAZOLAM HCL 2 MG/2ML IJ SOLN
INTRAMUSCULAR | Status: DC | PRN
  Administered 2018-02-02: 2 mg via INTRAVENOUS

## 2018-02-02 MED ORDER — MIDAZOLAM HCL 2 MG/2ML IJ SOLN
INTRAMUSCULAR | Status: AC
  Filled 2018-02-02: qty 2

## 2018-02-02 MED ORDER — IOPAMIDOL (ISOVUE-300) INJECTION 61%
50.0000 mL | Freq: Once | INTRAVENOUS | Status: AC | PRN
  Administered 2018-02-02: 50 mL

## 2018-02-02 MED ORDER — PROPOFOL 10 MG/ML IV BOLUS
INTRAVENOUS | Status: AC
  Filled 2018-02-02: qty 20

## 2018-02-02 MED ORDER — OXYCODONE-ACETAMINOPHEN 5-325 MG PO TABS
1.0000 | ORAL_TABLET | ORAL | Status: DC | PRN
  Administered 2018-02-02 – 2018-02-03 (×3): 2 via ORAL
  Filled 2018-02-02 (×3): qty 2

## 2018-02-02 MED ORDER — LIDOCAINE HCL (PF) 1 % IJ SOLN
INTRAMUSCULAR | Status: AC | PRN
  Administered 2018-02-02: 10 mL

## 2018-02-02 MED ORDER — MIDAZOLAM HCL 2 MG/2ML IJ SOLN
INTRAMUSCULAR | Status: AC | PRN
  Administered 2018-02-02: 1 mg via INTRAVENOUS
  Administered 2018-02-02: 2 mg via INTRAVENOUS

## 2018-02-02 MED ORDER — SUGAMMADEX SODIUM 200 MG/2ML IV SOLN
INTRAVENOUS | Status: DC | PRN
  Administered 2018-02-02: 200 mg via INTRAVENOUS

## 2018-02-02 MED ORDER — DEXAMETHASONE SODIUM PHOSPHATE 10 MG/ML IJ SOLN
INTRAMUSCULAR | Status: DC | PRN
  Administered 2018-02-02: 10 mg via INTRAVENOUS

## 2018-02-02 MED ORDER — MEPERIDINE HCL 50 MG/ML IJ SOLN: 6.2500 mg | INTRAMUSCULAR | Status: DC | PRN

## 2018-02-02 MED ORDER — MIDAZOLAM HCL 2 MG/2ML IJ SOLN
INTRAMUSCULAR | Status: AC
  Filled 2018-02-02: qty 6

## 2018-02-02 MED ORDER — LIDOCAINE 2% (20 MG/ML) 5 ML SYRINGE
INTRAMUSCULAR | Status: DC | PRN
  Administered 2018-02-02: 100 mg via INTRAVENOUS

## 2018-02-02 MED ORDER — ONDANSETRON HCL 4 MG/2ML IJ SOLN
INTRAMUSCULAR | Status: DC | PRN
  Administered 2018-02-02: 4 mg via INTRAVENOUS

## 2018-02-02 MED ORDER — ONDANSETRON HCL 4 MG/2ML IJ SOLN: 4.0000 mg | INTRAMUSCULAR | Status: DC | PRN

## 2018-02-02 MED ORDER — SODIUM CHLORIDE 0.9 % IV SOLN
INTRAVENOUS | Status: DC
  Administered 2018-02-02: 08:00:00 via INTRAVENOUS

## 2018-02-02 MED ORDER — FENTANYL CITRATE (PF) 100 MCG/2ML IJ SOLN
INTRAMUSCULAR | Status: AC
  Filled 2018-02-02: qty 4

## 2018-02-02 MED ORDER — CIPROFLOXACIN IN D5W 400 MG/200ML IV SOLN
INTRAVENOUS | Status: AC
  Administered 2018-02-02: 400 mg
  Filled 2018-02-02: qty 200

## 2018-02-02 SURGICAL SUPPLY — 52 items
AGENT HMST KT MTR STRL THRMB (HEMOSTASIS)
APL ESCP 34 STRL LF DISP (HEMOSTASIS) ×2
APL SKNCLS STERI-STRIP NONHPOA (GAUZE/BANDAGES/DRESSINGS) ×1
APPLICATOR SURGIFLO ENDO (HEMOSTASIS) ×2 IMPLANT
BAG URINE DRAINAGE (UROLOGICAL SUPPLIES) IMPLANT
BASKET ZERO TIP NITINOL 2.4FR (BASKET) ×1 IMPLANT
BENZOIN TINCTURE PRP APPL 2/3 (GAUZE/BANDAGES/DRESSINGS) ×2 IMPLANT
BLADE SURG 15 STRL LF DISP TIS (BLADE) ×1 IMPLANT
BLADE SURG 15 STRL SS (BLADE) ×2
BSKT STON RTRVL ZERO TP 2.4FR (BASKET) ×1
CATCHER STONE W/TUBE ADAPTER (UROLOGICAL SUPPLIES) IMPLANT
CATH FOLEY 2W COUNCIL 20FR 5CC (CATHETERS) ×1 IMPLANT
CATH FOLEY 2WAY SLVR  5CC 18FR (CATHETERS) ×1
CATH FOLEY 2WAY SLVR 5CC 18FR (CATHETERS) IMPLANT
CATH IMAGER II 65CM (CATHETERS) IMPLANT
CATH X-FORCE N30 NEPHROSTOMY (TUBING) ×2 IMPLANT
CHLORAPREP W/TINT 26ML (MISCELLANEOUS) ×1 IMPLANT
COVER SURGICAL LIGHT HANDLE (MISCELLANEOUS) ×2 IMPLANT
DRAPE C-ARM 42X120 X-RAY (DRAPES) ×2 IMPLANT
DRAPE LINGEMAN PERC (DRAPES) ×2 IMPLANT
DRAPE SURG IRRIG POUCH 19X23 (DRAPES) ×2 IMPLANT
DRSG PAD ABDOMINAL 8X10 ST (GAUZE/BANDAGES/DRESSINGS) ×2 IMPLANT
DRSG TEGADERM 8X12 (GAUZE/BANDAGES/DRESSINGS) ×2 IMPLANT
FIBER LASER TRAC TIP (UROLOGICAL SUPPLIES) IMPLANT
FLOSEAL 5ML (HEMOSTASIS) ×2 IMPLANT
GAUZE SPONGE 4X4 12PLY STRL (GAUZE/BANDAGES/DRESSINGS) ×1 IMPLANT
GLOVE BIOGEL M 8.0 STRL (GLOVE) ×2 IMPLANT
GOWN STRL REUS W/TWL XL LVL3 (GOWN DISPOSABLE) ×2 IMPLANT
GUIDEWIRE AMPLAZ .035X145 (WIRE) ×4 IMPLANT
GUIDEWIRE STR DUAL SENSOR (WIRE) ×1 IMPLANT
HOLDER FOLEY CATH W/STRAP (MISCELLANEOUS) ×2 IMPLANT
KIT BASIN OR (CUSTOM PROCEDURE TRAY) ×2 IMPLANT
MANIFOLD NEPTUNE II (INSTRUMENTS) ×2 IMPLANT
NS IRRIG 1000ML POUR BTL (IV SOLUTION) ×2 IMPLANT
PACK CYSTO (CUSTOM PROCEDURE TRAY) ×2 IMPLANT
PAD ABD 8X10 STRL (GAUZE/BANDAGES/DRESSINGS) ×2 IMPLANT
PROBE LITHOCLAST ULTRA 3.8X403 (UROLOGICAL SUPPLIES) IMPLANT
PROBE PNEUMATIC 1.0MMX570MM (UROLOGICAL SUPPLIES) IMPLANT
SHEATH PEELAWAY SET 9 (SHEATH) ×2 IMPLANT
SPONGE LAP 4X18 RFD (DISPOSABLE) ×2 IMPLANT
STENT CONTOUR 6FRX26X.038 (STENTS) ×1 IMPLANT
STONE CATCHER W/TUBE ADAPTER (UROLOGICAL SUPPLIES) IMPLANT
SURGIFLO W/THROMBIN 8M KIT (HEMOSTASIS) IMPLANT
SUT MNCRL AB 4-0 PS2 18 (SUTURE) IMPLANT
SUT SILK 2 0 30  PSL (SUTURE) ×1
SUT SILK 2 0 30 PSL (SUTURE) IMPLANT
SYR 10ML LL (SYRINGE) ×2 IMPLANT
SYR 20CC LL (SYRINGE) ×4 IMPLANT
TOWEL OR 17X26 10 PK STRL BLUE (TOWEL DISPOSABLE) ×2 IMPLANT
TOWEL OR NON WOVEN STRL DISP B (DISPOSABLE) ×2 IMPLANT
TRAY FOLEY MTR SLVR 16FR STAT (SET/KITS/TRAYS/PACK) ×2 IMPLANT
TUBING CONNECTING 10 (TUBING) ×4 IMPLANT

## 2018-02-02 NOTE — Transfer of Care (Signed)
Immediate Anesthesia Transfer of Care Note  Patient: [name] [name]  Procedure(s) Performed: NEPHROLITHOTOMY PERCUTANEOUS (Left )  Patient Location: PACU  Anesthesia Type:General  Level of Consciousness: sedated  Airway & Oxygen Therapy: Patient Spontanous Breathing and Patient connected to face mask oxygen  Post-op Assessment: Report given to RN and Post -op Vital signs reviewed and stable  Post vital signs: Reviewed and stable  Last Vitals:  Vitals Value Taken Time  BP 161/92 02/02/2018  2:00 PM  Temp    Pulse 101 02/02/2018  2:01 PM  Resp 13 02/02/2018  2:01 PM  SpO2 97 % 02/02/2018  2:01 PM  Vitals shown include unvalidated device data.  Last Pain:  Vitals:   02/02/18 0821  TempSrc:   PainSc: 0-No pain         Complications: No apparent anesthesia complications

## 2018-02-02 NOTE — Anesthesia Postprocedure Evaluation (Signed)
Anesthesia Post Note  Patient: [name] [name]  Procedure(s) Performed: NEPHROLITHOTOMY PERCUTANEOUS (Left )     Patient location during evaluation: PACU Anesthesia Type: General Level of consciousness: sedated Pain management: pain level controlled Vital Signs Assessment: post-procedure vital signs reviewed and stable Respiratory status: spontaneous breathing Cardiovascular status: stable Postop Assessment: no apparent nausea or vomiting Anesthetic complications: no    Last Vitals:  Vitals:   02/02/18 1415 02/02/18 1430  BP: (!) 158/93 (!) 159/101  Pulse: (!) 104 (!) 109  Resp: 14 18  Temp:    SpO2: 100% 98%    Last Pain:  Vitals:   02/02/18 1400  TempSrc:   PainSc: Asleep   Pain Goal:                 Margarie Mcguirt JR,JOHN Vernell Townley

## 2018-02-02 NOTE — Op Note (Signed)
PATIENT:  [name] [name]  PRE-OPERATIVE DIAGNOSIS: Left Renal calculi  POST-OPERATIVE DIAGNOSIS: Same  PROCEDURE: 1. Percutaneous nephrostomy sheath placement. 2.  Right/left percutaneous nephrolithotomy ( 2.1 cm.) 3. Antegrade double-J stent placement. 4. Nephrostomy tube placement.   5.  Fluoroscopy time 1 hour  SURGEON:  Claybon Jabs  INDICATION: [name] [name] is a  57 year old male with left renal calculi.  He had a stone in his proximal left ureter but has not been having a lot of pain.  He also had a stone in the renal pelvis and 1 in the lower pole as well.  We discussed the options for management and he has elected to proceed with a PCNL.  His preop KUB revealed 3 stones within the kidney and the stone that was in the ureter no longer appeared to be present as it appeared to have migrated back up into the kidney.  He had his percutaneous nephrostomy tube placed by interventional Radiology prior to coming to the OR.  His urine was noted to be quite bloody from this procedure.  ANESTHESIA:  General  EBL:     100 cc  DRAINS:  6 French, 26 cm double-J stent in the left ureter and a 20 [place] tip catheter as a nephrostomy tube.  LOCAL MEDICATIONS USED:  None  SPECIMEN:    Stone given to patient.  Description of procedure: After informed consent the patient was taken to the operating room and administered general endotracheal anesthesia. Once fully anesthetized the patient had an 8 French Foley catheter placed It was then moved from the stretcher onto the operating room table in a prone position with bony prominences padded and chest pads in place. The flank with exiting nephrostomy catheter was then sterilely prepped and draped in standard fashion. An official timeout was then performed.  Using the existing nephrostomy catheter access I passed a 0.038 inch floppy tipped guidewire down the ureter into the bladder under fluoroscopy.  This was left in place and the nephrostomy  catheter was removed.  A transverse incision was made over the guidewire and a peel-away coaxial catheter was then passed over the guidewire and down the ureter under fluoroscopy.  The inner portion of the coaxial system was then removed and a second guidewire was passed through this catheter and down the ureter into the bladder under fluoroscopy.  The coaxial catheter was then removed and one of the guidewires was secured to the drape as a safety guidewire and the second guidewire was used as a working guidewire.  The NephroMax nephrostomy dilating balloon was then passed over the working guidewire into the area of the renal pelvis under fluoroscopy.  It was then inflated using dilute contrast under fluoroscopy until the balloon was fully inflated.  I then passed the 28 French nephrostomy access sheath over the balloon into the area of the renal pelvis under fluoroscopy and then deflated the balloon and removed the dilating balloon.  The 22 French rigid nephroscope was then passed under direct vision through the nephrostomy access sheath.  Clotted blood was evacuated and the largest stone was identified. I used the lithoclast and using ultrasound I was able to fragment and remove the majority of this stone.  The remaining fragments were removed with 2 prong graspers.  I then switched to the flexible cystoscope and passed this into the kidney and into the upper pole.  The stone in the upper pole was identified.  It was engaged in a 0 tip Nitinol basket and was  pulled into the area of the renal pelvis.  I then switched back to the rigid scope and was able to engage the stone in the Nitinol basket and remove this 2nd stone.  I then switched back to the flexible cystoscope and again systematically inspected the upper and middle pole calices and found another stone.  This was removed with the basket as well.  There was a small fragment noted in the renal pelvis as well and I was able to engage this in the basket and  remove it as well.  I then switched back to the flexible cystoscope.    I passed the flexible cystoscope into the proximal ureter to partially occlude the ureter and then injected full-strength contrast down the ureter.  This allowed me to visualize the ureter and noted no stone within the ureter essentially ruling out the stone having passed distally down the ureter and confirming that it had in fact migrated back up into the kidney.  I switched back to the rigid nephroscope and back loaded the nephroscope over the working guidewire and then passed a stent over the guidewire down into the bladder.  As I began to remove the guidewire good curl was noted in the bladder under fluoroscopy.   I used the 2 prong graspers to place the stent in the renal pelvis with the curl in the renal pelvis and then I measured the distance to the area where the nephrostomy sheath passed into the renal parenchyma in preparation for placement of FloSeal however it was my opinion that there was too much bleeding to safely perform a to perform a tubeless procedure and therefore elected rather  To pass the [place] tip catheter through the nephrostomy sheath into the area the renal pelvis under fluoroscopy and then fill the balloon with half strength contrast using approximately 3 cc.  This appeared to tamponade the bleeding.  I did however use the laparoscopic introducer and passed the introducer along side the catheter through the nephrostomy sheath and injected the FloSeal at the level of the renal parenchyma and then backed the sheath out slightly and injected more FloSeal.  I then removed the access sheath completely and noted there was no active bleeding.  I then secured the nephrostomy catheter to the skin with a to 0 silk suture in a figure-of-eight fashion and connected this to closed system drainage.  A sterile, occlusive dressing was applied and the patient was awakened and taken to the recovery room in stable and  satisfactory condition.  He tolerated procedure well with no intraoperative complications.    PLAN OF CARE: Discharge to home after an overnight stay.  PATIENT DISPOSITION:  PACU - hemodynamically stable.

## 2018-02-02 NOTE — Anesthesia Procedure Notes (Signed)
Procedure Name: Intubation Date/Time: 02/02/2018 12:12 PM Performed by: Sharlette Dense, CRNA Patient Re-evaluated:Patient Re-evaluated prior to induction Oxygen Delivery Method: Circle system utilized Preoxygenation: Pre-oxygenation with 100% oxygen Induction Type: IV induction Ventilation: Mask ventilation without difficulty and Oral airway inserted - appropriate to patient size Laryngoscope Size: Miller and 3 Grade View: Grade II Tube type: Oral Tube size: 8.0 mm Number of attempts: 1 Airway Equipment and Method: Stylet Placement Confirmation: ETT inserted through vocal cords under direct vision,  positive ETCO2 and breath sounds checked- equal and bilateral Secured at: 22 cm Tube secured with: Tape Dental Injury: Teeth and Oropharynx as per pre-operative assessment

## 2018-02-02 NOTE — Procedures (Signed)
  Pre-operative Diagnosis: Left renal calculi      Post-operative Diagnosis: Left renal calculi   Indications: Needs percutaneous access for nephrolithotomy procedure  Procedure: Percutaneous left nephroureteral catheter placement  Findings: Minimal hydronephrosis.  Largest stones in renal pelvis and upper pole.  Collecting system was punctured and opacified with contrast.  Lower pole calyx was cannulated and used for percutaneous access.  5 Fr catheter placement and tip in bladder.  Complications: None     EBL: Minimal  Plan: To OR for surgery.

## 2018-02-02 NOTE — Anesthesia Preprocedure Evaluation (Signed)
Anesthesia Evaluation  Patient identified by MRN, date of birth, ID band Patient awake    Reviewed: Allergy & Precautions, NPO status , Patient's Chart, lab work & pertinent test results  History of Anesthesia Complications (+) PONV  Airway Mallampati: I       Dental no notable dental hx. (+) Teeth Intact   Pulmonary sleep apnea and Continuous Positive Airway Pressure Ventilation ,    Pulmonary exam normal breath sounds clear to auscultation       Cardiovascular hypertension, Normal cardiovascular exam Rhythm:Regular Rate:Normal     Neuro/Psych  Headaches, Seizures -, Well Controlled,  PSYCHIATRIC DISORDERS Anxiety Depression    GI/Hepatic GERD  Medicated,  Endo/Other  diabetes  Renal/GU      Musculoskeletal   Abdominal Normal abdominal exam  (+)   Peds  Hematology  (+) anemia ,   Anesthesia Other Findings   Reproductive/Obstetrics                             Anesthesia Physical Anesthesia Plan  ASA: II  Anesthesia Plan: General   Post-op Pain Management:    Induction: Intravenous  PONV Risk Score and Plan: 4 or greater and Ondansetron, Dexamethasone, Scopolamine patch - Pre-op, Midazolam and Promethazine  Airway Management Planned: Oral ETT  Additional Equipment:   Intra-op Plan:   Post-operative Plan: Extubation in OR  Informed Consent: I have reviewed the patients History and Physical, chart, labs and discussed the procedure including the risks, benefits and alternatives for the proposed anesthesia with the patient or authorized representative who has indicated his/her understanding and acceptance.   Dental advisory given  Plan Discussed with: CRNA and Surgeon  Anesthesia Plan Comments:         Anesthesia Quick Evaluation

## 2018-02-02 NOTE — Consult Note (Signed)
Chief Complaint: Patient was seen in consultation today for image guided percutaneous nephroureteral stent placement   Referring Physician(s): Dr. Karsten Ro  Supervising Physician: Markus Daft  Patient Status: [facility] - Out-pt  History of Present Illness: [name] [name] is a 57 y.o. male with a past medical history of several episodes of kidney stones over previous 2-3 years, 73 of kidney stones have passed through urethra at home however patient reports one episode of kidney stones which required lithotripsy. He reports that he has been having recurrent UTIs that were treated with outpatient antibiotics, most recently completed antibiotic course was 7/31. He states every 3-4 days for several weeks there has been blood in his urine and some difficulty urinating. Most recent imaging shows stones that are too large to pass without intervention.  He presents today for placement of left nephroureteral stent by IR followed by stone retrieval performed by nephrology. Patient discussed with Dr. Anselm Pancoast who is agreeable to proceed with procedure today.  Past Medical History:  Diagnosis Date  . Adenomatous colon polyp 10/1999  . Anxiety   . Arthritis    neck, yoga helps.  . Cancer ([place])    skin  . Cataract   . Complex partial seizure ([place])   . Crohn's disease of small and large intestines ([place]) 1999  . Depression   . Diabetes mellitus    no meds at this time 10-24-17  . Esophageal stricture   . External hemorrhoids   . GERD (gastroesophageal reflux disease)   . History of kidney stones    3 large stones still present  . Hyperlipemia   . Hypertension   . Migraines   . MRSA (methicillin resistant Staphylococcus aureus) 10/2010   . Osteoporosis   . PONV (postoperative nausea and vomiting)   . PTSD (post-traumatic stress disorder)   . Renal cyst, acquired, left 07/08/2017  . Renal lesion 05/28/2017  . Seizures ([place])   . Skin cancer    squamous cell multiple; [place]; followed every 3  months.  . Sleep apnea    CPAP machine, uses nightly  . Staphylococcus aureus bacteremia with sepsis ([place]) 05/28/2017    Past Surgical History:  Procedure Laterality Date  . CATARACT EXTRACTION Bilateral   . COLONOSCOPY    . ELBOW SURGERY  2012   elbow MRSA infection   . EYE SURGERY     cataracts removed, /w IOL  . INSERTION OF MESH N/A 07/01/2016   Procedure: INSERTION OF MESH;  Surgeon: [name], MD;  Location: [facility];  Service: General;  Laterality: N/A;  . SCALP LACERATION REPAIR Right 10/16/2017   From fall/staples  . SHOULDER SURGERY    . SINUS SURGERY WITH INSTATRAK    . TEE WITHOUT CARDIOVERSION N/A 05/09/2017   Procedure: TRANSESOPHAGEAL ECHOCARDIOGRAM (TEE);  Surgeon: [name], MD;  Location[facility] ENDOSCOPY;  Service: Cardiovascular;  Laterality: N/A;  . TRANSTHORACIC ECHOCARDIOGRAM  02/22/2011   EF 55-65%; increased pattern of LVH with mild conc hypertrophy, abnormal relaxation & increased filling pressure (grade 2 diastolic dysfunction); atrial septum thickened (lipomatous hypertrophy)  . UMBILICAL HERNIA REPAIR N/A 07/01/2016   Procedure: UMBILICAL HERNIA REPAIR WITH MESH;  Surgeon: [name], MD;  Location: [facility];  Service: General;  Laterality: N/A;  . UPPER GASTROINTESTINAL ENDOSCOPY    . VASECTOMY      Allergies: Azithromycin; Beef-derived products; Milk-related compounds; Penicillins; Sulfonamide derivatives; and Dilaudid [hydromorphone hcl]  Medications: Prior to Admission medications   Medication Sig Start Date End Date Taking? Authorizing Provider  Ascorbic Acid (VITAMIN C) 1000 MG tablet Take 1,000 mg by mouth at bedtime.   Yes [provider]  aspirin EC 81 MG tablet Take 81 mg at bedtime by mouth.    Yes [provider]  Calcium Carbonate-Vitamin D (CALCIUM 600+D PO) Take 1 tablet by mouth 2 (two) times daily.   Yes [provider]  cholecalciferol (VITAMIN D) 1000 units tablet Take 1,000 Units at bedtime by mouth.     Yes [provider]  ciprofloxacin (CIPRO) 500 MG tablet Take 1 tablet (500 mg total) by mouth 2 (two) times daily. 01/05/18  Yes Wardell Honour, MD  diazepam (VALIUM) 10 MG tablet Take 10 mg by mouth See admin instructions. Take prior to MRI as directed 01/16/18  Yes [provider]  esomeprazole (NEXIUM) 40 MG capsule TAKE 1 CAPSULE TWICE A DAY.MAKE AN APPOINTMENT FOR    FURTHER REFILLS 10/27/17  Yes Ladene Artist, MD  esomeprazole (NEXIUM) 40 MG capsule TAKE 1 CAPSULE TWICE A DAY.MAKE AN APPOINTMENT FOR    FURTHER REFILLS 01/30/18  Yes Ladene Artist, MD  Eszopiclone 3 MG TABS Take 1 tablet (3 mg total) by mouth at bedtime. Take immediately before bedtime 03/21/17  Yes Wardell Honour, MD  ferrous sulfate 325 (65 FE) MG tablet Take 325 mg at bedtime by mouth.    Yes [provider]  folic acid (FOLVITE) 1 MG tablet Take 2 tablets (2 mg total) by mouth daily. 10/27/17  Yes Wardell Honour, MD  Ibuprofen-diphenhydrAMINE HCl (ADVIL PM) 200-25 MG CAPS Take 1 tablet by mouth at bedtime as needed (sleep).   Yes [provider]  Lactase (DAIRY-RELIEF PO) Take 2 tablets by mouth 2 (two) times daily.    Yes [provider]  lamoTRIgine (LAMICTAL) 150 MG tablet Take 1 tablet (150 mg total) by mouth daily. Patient taking differently: Take 150 mg by mouth 2 (two) times daily.  10/09/17  Yes Dennie Bible, NP  mercaptopurine (PURINETHOL) 50 MG tablet FOR DIRECTIONS ON HOW TO   TAKE THIS MEDICINE, READ   THE ENCLOSED MEDICATION    INFORMATION FORM Patient taking differently: Take 100 mg by mouth daily.  08/27/17  Yes Ladene Artist, MD  mesalamine (LIALDA) 1.2 g EC tablet TAKE 2 TABLETS DAILY (2.4 GTOTAL) 01/30/18  Yes Ladene Artist, MD  methscopolamine (PAMINE FORTE) 5 MG tablet Take 1 tablet (5 mg total) by mouth 2 (two) times daily. 07/21/17  Yes Ladene Artist, MD  mirtazapine (REMERON) 30 MG tablet Take 30 mg by mouth at bedtime.   Yes [provider]  potassium chloride SA (K-DUR,KLOR-CON) 20 MEQ tablet Take 1 tablet (20 mEq total) by mouth 2 (two) times daily. 10/27/17  Yes Wardell Honour, MD  Probiotic Product (PROBIOTIC PO) Take 1 tablet at bedtime by mouth.    Yes [provider]  rifaximin (XIFAXAN) 550 MG TABS tablet Take 1 tablet (550 mg total) by mouth 2 (two) times daily. 08/27/17  Yes Ladene Artist, MD  sertraline (ZOLOFT) 100 MG tablet Take 200 mg daily with breakfast by mouth.  03/16/17  Yes [provider]  vitamin B-12 (CYANOCOBALAMIN) 500 MCG tablet Take 500 mcg by mouth at bedtime.   Yes [provider]  glucose blood (CHOICE DM FORA G20 TEST STRIPS) test strip Use as instructed 11/13/12   [name], MD  Lactulose 20 GM/30ML SOLN Take 30 mLs (20 g total) by mouth 2 (two) times daily. Patient  not taking: Reported on 01/20/2018 09/15/17   Ladene Artist, MD  mupirocin ointment (BACTROBAN) 2 % Sig to affected area twice a day x 7 days. Patient not taking: Reported on 01/20/2018 12/16/17   Horald Pollen, MD  ONE [facility] ULTRA TEST test strip USE AS INSTRUCTED    Gale Journey Damaris Hippo, PA-C     Family History  Problem Relation Age of Onset  . Colon polyps Father   . Stroke Father   . Hyperlipidemia Father   . Hypertension Father   . Heart disease Mother        CABG at age 70  . Hyperlipidemia Mother   . Hypertension Mother   . Stroke Paternal Grandmother   . Stroke Paternal Grandfather   . Other Child        tetrology of fallot (cornealia deland syndrome)  . Colon cancer Neg Hx   . Esophageal cancer Neg Hx   . Stomach cancer Neg Hx   . Rectal cancer Neg Hx     Social History   Socioeconomic History  . Marital status: Married    Spouse name: [name]  . Number of children: 2  . Years of education: college  . Highest education level: Not on file  Occupational History  . Occupation: disabled    Comment: seizure disorder with memory impairment  Social Needs  . Financial  resource strain: Not on file  . Food insecurity:    Worry: Not on file    Inability: Not on file  . Transportation needs:    Medical: Not on file    Non-medical: Not on file  Tobacco Use  . Smoking status: Never Smoker  . Smokeless tobacco: Never Used  Substance and Sexual Activity  . Alcohol use: No  . Drug use: No  . Sexual activity: Yes    Birth control/protection: Surgical    Comment: 1 partner in last 12 months  Lifestyle  . Physical activity:    Days per week: Not on file    Minutes per session: Not on file  . Stress: Not on file  Relationships  . Social connections:    Talks on phone: Not on file    Gets together: Not on file    Attends religious service: Not on file    Active member of club or organization: Not on file    Attends meetings of clubs or organizations: Not on file    Relationship status: Not on file  Other Topics Concern  . Not on file  Social History Narrative   Marital status: married x 32 years;       Lives:   lives at home with his family.      Children:  two children; no grandchildren.  Son is special needs at age 39yo; Cornelian Delaine with TOF.       Employment: unemployed; [employer] long term disability      Tobacco: none      Alcohol: none      Drugs: none      Exercise: unable to exercise due to frequent falls   Patient has a college education.   Patient is right-handed.   Patient drinks one cup of soda daily.     Review of Systems: A 12 point ROS discussed and pertinent positives are indicated in the HPI above.  All other systems are negative.  Review of Systems  Constitutional: Negative for fever.  Respiratory: Negative for cough and shortness of breath.   Cardiovascular: Negative  for chest pain.  Gastrointestinal: Negative for abdominal pain.  Genitourinary: Positive for hematuria. Negative for flank pain.  Skin: Negative for rash.  Psychiatric/Behavioral: Negative for confusion.    Vital Signs: BP 140/83   Pulse 76    Temp 98.2 F (36.8 C) (Oral)   Resp 18   Ht 5' 11"  (1.803 m)   Wt 203 lb 11.3 oz (92.4 kg)   SpO2 99%   BMI 28.41 kg/m   Physical Exam  Constitutional: He is oriented to person, place, and time. No distress.  HENT:  Head: Normocephalic.  Cardiovascular: Normal rate, regular rhythm and normal heart sounds.  Pulmonary/Chest: Effort normal and breath sounds normal.  Abdominal: Soft. He exhibits no distension. There is no tenderness.  Neurological: He is alert and oriented to person, place, and time.  Skin: Skin is warm and dry. No rash noted. He is not diaphoretic.  Psychiatric: He has a normal mood and affect. His behavior is normal. Judgment and thought content normal.  Nursing note and vitals reviewed.        Imaging: No results found.  Labs:  CBC: Recent Labs    10/27/17 1714 12/16/17 1733 01/05/18 1644 01/26/18 1145  WBC 4.8 5.2 4.3 3.9*  HGB 11.6* 10.8* 10.9* 12.1*  HCT 32.7* 30.7* 30.3* 34.6*  PLT 127* 120* 112* 97*    COAGS: Recent Labs    02/18/17 1044 05/05/17 0821 02/02/18 0802  INR 1.2* 1.32 1.30    BMP: Recent Labs    12/16/17 1733 01/05/18 1644 01/26/18 1145 02/02/18 0802  NA 144 145* 145 144  K 3.9 3.6 3.8 3.4*  CL 110* 109* 112* 113*  CO2 19* 20 25 25   GLUCOSE 99 80 103* 109*  BUN 12 11 14 11   CALCIUM 9.0 8.8 9.2 8.9  CREATININE 0.86 0.79 0.73 0.86  GFRNONAA 97 100 >60 >60  GFRAA 112 116 >60 >60    LIVER FUNCTION TESTS: Recent Labs    10/21/17 1543 10/27/17 1714 01/05/18 1644 01/26/18 1145  BILITOT 1.9* 3.4* 3.3* 2.7*  AST 28 27 31  37  ALT 22 18 15 21   ALKPHOS 164* 146* 191* 216*  PROT 6.5 6.8 6.3 6.8  ALBUMIN 4.1 4.6 4.4 4.2    TUMOR MARKERS: Recent Labs    10/21/17 1543  AFPTM 5.6    Assessment and Plan:  Large left nephrolithiasis requiring nephroureteral stent placement by IR with follow up stone retrieval by nephrology. Patient presents without any new complaints today, acknowledges he has been NPO, he  does not take any blood thinners. All lab work within normal limits today for procedure purposes. Patient agreeable to proceed with procedure today.  Risks and benefits of nephroureteral stent placement were discussed with the patient and wife including, but not limited to, infection, bleeding, significant bleeding causing loss or decrease in renal function or damage to adjacent structures.   All of the patient's questions were answered, patient is agreeable to proceed.  Consent signed and in chart.  Thank you for this interesting consult.  I greatly enjoyed meeting [name] [name] and look forward to participating in their care.  A copy of this report was sent to the requesting provider on this date.  Electronically Signed: Joaquim Nam, PA-C 02/02/2018, 9:19 AM   I spent a total of  30 Minutes in face to face in clinical consultation, greater than 50% of which was counseling/coordinating care for left nephroureteral stent placement

## 2018-02-03 ENCOUNTER — Encounter (HOSPITAL_COMMUNITY): Payer: Self-pay | Admitting: Urology

## 2018-02-03 DIAGNOSIS — N132 Hydronephrosis with renal and ureteral calculous obstruction: Secondary | ICD-10-CM | POA: Diagnosis not present

## 2018-02-03 LAB — HEMOGLOBIN AND HEMATOCRIT, BLOOD
HCT: 29.9 % — ABNORMAL LOW (ref 39.0–52.0)
Hemoglobin: 10.7 g/dL — ABNORMAL LOW (ref 13.0–17.0)

## 2018-02-03 MED ORDER — PHENAZOPYRIDINE HCL 200 MG PO TABS: 200.0000 mg | ORAL_TABLET | Freq: Three times a day (TID) | ORAL | 0 refills | Status: DC | PRN

## 2018-02-03 MED ORDER — OXYCODONE-ACETAMINOPHEN 10-325 MG PO TABS: 1.0000 | ORAL_TABLET | ORAL | 0 refills | Status: DC | PRN

## 2018-02-03 MED ORDER — ESZOPICLONE 3 MG PO TABS: 3.0000 mg | ORAL_TABLET | Freq: Every day | ORAL | Status: DC

## 2018-02-03 NOTE — Discharge Summary (Signed)
Physician Discharge Summary      Patient ID: [name] [name] MRN: [id number] DOB/AGE: 56-May-1962 56 y.o.  Admit date: 02/02/2018 Discharge date: 02/03/2018  Admission Diagnoses: LEFT RENAL STONES, LEFT URETEROPELVIC JUNCTION STONES  Discharge Diagnoses:  Active Problems:   Renal calculus, left   Discharged Condition: good  Hospital Course: He was electively admitted to undergo a left PCNL.  He had a nephrostomy tube placed preoperatively and was taken to the operating room where he underwent an uncomplicated left PCNL.  It was a little bloodier than I felt comfortable performing tubeless so I left a stent in the ureter and a nephrostomy tube overnight.  His urine was bloody last night.  It has cleared up to a significant degree this morning.  His Foley catheter has been removed.  His nephrostomy tube has been clamped for over an hour now and he is not experiencing any flank pain or leakage around the nephrostomy tube.  His postoperative CT scan revealed a stone in the lower pole which had been located there previously as well as to other small fragments.  The stent and nephrostomy tube were noted to be in good position.  There is no significant perinephric fluid or blood.  He is experiencing some slight discomfort in the bladder likely secondary to his stent.  He otherwise has been up and ambulating this morning.  No nausea or vomiting.  His pain is well controlled.  He is ready for discharge at this time.  Discharge Exam: Blood pressure 103/68, pulse 74, temperature 97.7 F (36.5 C), temperature source Oral, resp. rate 18, height 5' 11"  (1.803 m), weight 92.4 kg, SpO2 94 %. General: Awake, alert and in no apparent distress. Chest: Normal respiratory effort. Cardiovascular: Regular rate and rhythm. Abdomen: Soft, nontender, nondistended. Back: His nephrostomy dressing is dry and intact.  His nephrostomy tube is capped.   Disposition: Discharge disposition: 01-Home or Self  Care       Discharge Instructions    Discharge patient   Complete by:  As directed    Discharge disposition:  01-Home or Self Care   Discharge patient date:  02/03/2018   Nursing communication   Complete by:  As directed    Remove nephrostomy tube at about 10:00.  First remove the 3-4 cc of fluid from the balloon and then cut the stitch over the catheter so the stitch in the skin remains.     Allergies as of 02/03/2018      Reactions   Azithromycin Swelling   SWELLING REACTION UNSPECIFIED    Beef-derived Products Diarrhea   RED MEAT > UNSPECIFIED REACTION    Milk-related Compounds Diarrhea   DAIRY >UNSPECIFIED REACTION    Penicillins Hives   Rash as child- tolerates Ancef/CHILDHOOD ALLERGY Has patient had a PCN reaction causing immediate rash, facial/tongue/throat swelling, SOB or lightheadedness with hypotension: YES Has patient had a PCN reaction causing severe rash involving mucus membranes or skin necrosis: Unknown Has patient had a PCN reaction that required hospitalization: Unknown Has patient had a PCN reaction occurring within the last 10 years:Unknown If all of the above answers are "NO", then may proceed with Cephalosporin [place]   Sulfonamide Derivatives Hives   Dilaudid [hydromorphone Hcl] Nausea And Vomiting      Medication List    STOP taking these medications   Lactulose 20 GM/30ML Soln   mupirocin ointment 2 % Commonly known as:  BACTROBAN     TAKE these medications   ADVIL PM 200-25 MG Caps Generic drug:  Ibuprofen-diphenhydrAMINE HCl Take 1 tablet by mouth at bedtime as needed (sleep).   aspirin EC 81 MG tablet Take 81 mg at bedtime by mouth.   CALCIUM 600+D PO Take 1 tablet by mouth 2 (two) times daily.   cholecalciferol 1000 units tablet Commonly known as:  VITAMIN D Take 1,000 Units at bedtime by mouth.   ciprofloxacin 500 MG tablet Commonly known as:  CIPRO Take 1 tablet (500 mg total) by mouth 2 (two) times daily.   DAIRY-RELIEF  PO Take 2 tablets by mouth 2 (two) times daily.   diazepam 10 MG tablet Commonly known as:  VALIUM Take 10 mg by mouth See admin instructions. Take prior to MRI as directed   esomeprazole 40 MG capsule Commonly known as:  NEXIUM TAKE 1 CAPSULE TWICE A DAY.MAKE AN APPOINTMENT FOR    FURTHER REFILLS   esomeprazole 40 MG capsule Commonly known as:  NEXIUM TAKE 1 CAPSULE TWICE A DAY.MAKE AN APPOINTMENT FOR    FURTHER REFILLS   Eszopiclone 3 MG Tabs Take 1 tablet (3 mg total) by mouth at bedtime. Take immediately before bedtime   ferrous sulfate 325 (65 FE) MG tablet Take 325 mg at bedtime by mouth.   folic acid 1 MG tablet Commonly known as:  FOLVITE Take 2 tablets (2 mg total) by mouth daily.   ONE TOUCH ULTRA TEST test strip Generic drug:  glucose blood USE AS INSTRUCTED   glucose blood test strip Use as instructed   lamoTRIgine 150 MG tablet Commonly known as:  LAMICTAL Take 1 tablet (150 mg total) by mouth daily. What changed:  when to take this   mercaptopurine 50 MG tablet Commonly known as:  PURINETHOL FOR DIRECTIONS ON HOW TO   TAKE THIS MEDICINE, READ   THE ENCLOSED MEDICATION    INFORMATION FORM What changed:    how much to take  how to take this  when to take this  additional instructions   mesalamine 1.2 g EC tablet Commonly known as:  LIALDA TAKE 2 TABLETS DAILY (2.4 GTOTAL)   methscopolamine 5 MG tablet Commonly known as:  PAMINE FORTE Take 1 tablet (5 mg total) by mouth 2 (two) times daily.   mirtazapine 30 MG tablet Commonly known as:  REMERON Take 30 mg by mouth at bedtime.   oxyCODONE-acetaminophen 10-325 MG tablet Commonly known as:  PERCOCET Take 1-2 tablets by mouth every 4 (four) hours as needed for pain. Maximum dose per 24 hours - 8 pills   phenazopyridine 200 MG tablet Commonly known as:  PYRIDIUM Take 1 tablet (200 mg total) by mouth 3 (three) times daily as needed for pain.   potassium chloride SA 20 MEQ tablet Commonly  known as:  K-DUR,KLOR-CON Take 1 tablet (20 mEq total) by mouth 2 (two) times daily.   PROBIOTIC PO Take 1 tablet at bedtime by mouth.   rifaximin 550 MG Tabs tablet Commonly known as:  XIFAXAN Take 1 tablet (550 mg total) by mouth 2 (two) times daily.   sertraline 100 MG tablet Commonly known as:  ZOLOFT Take 200 mg daily with breakfast by mouth.   vitamin B-12 500 MCG tablet Commonly known as:  CYANOCOBALAMIN Take 500 mcg by mouth at bedtime.   vitamin C 1000 MG tablet Take 1,000 mg by mouth at bedtime.      Follow-up Information    [facility] On 02/09/2018.   Why:  For your appiontment at 8:15 Contact information: [address]  [place] [phone]          Signed: Claybon Jabs 02/03/2018, 6:57 AM

## 2018-02-03 NOTE — Plan of Care (Signed)
  Problem: Education: Goal: Knowledge of General Education information will improve Description Including pain rating scale, medication(s)/side effects and non-pharmacologic comfort measures Outcome: Progressing   Problem: Clinical Measurements: Goal: Postoperative complications will be avoided or minimized Outcome: Progressing   Problem: Skin Integrity: Goal: Demonstration of wound healing without infection will improve Outcome: Progressing

## 2018-02-09 DIAGNOSIS — N200 Calculus of kidney: Secondary | ICD-10-CM | POA: Diagnosis not present

## 2018-02-18 ENCOUNTER — Telehealth: Payer: Self-pay | Admitting: *Deleted

## 2018-02-18 ENCOUNTER — Encounter: Payer: Self-pay | Admitting: Neurology

## 2018-02-18 ENCOUNTER — Ambulatory Visit (INDEPENDENT_AMBULATORY_CARE_PROVIDER_SITE_OTHER): Payer: 59 | Admitting: Neurology

## 2018-02-18 VITALS — BP 144/88 | HR 99 | Ht 71.0 in | Wt 210.0 lb

## 2018-02-18 DIAGNOSIS — K50111 Crohn's disease of large intestine with rectal bleeding: Secondary | ICD-10-CM | POA: Diagnosis not present

## 2018-02-18 DIAGNOSIS — Z9989 Dependence on other enabling machines and devices: Secondary | ICD-10-CM

## 2018-02-18 DIAGNOSIS — D501 Sideropenic dysphagia: Secondary | ICD-10-CM | POA: Diagnosis not present

## 2018-02-18 DIAGNOSIS — K921 Melena: Secondary | ICD-10-CM | POA: Insufficient documentation

## 2018-02-18 DIAGNOSIS — D500 Iron deficiency anemia secondary to blood loss (chronic): Secondary | ICD-10-CM | POA: Diagnosis not present

## 2018-02-18 DIAGNOSIS — G4733 Obstructive sleep apnea (adult) (pediatric): Secondary | ICD-10-CM

## 2018-02-18 DIAGNOSIS — G4719 Other hypersomnia: Secondary | ICD-10-CM

## 2018-02-18 NOTE — Telephone Encounter (Signed)
Patient was in office today seeing Dr Brett Fairy. He stated he needs refills on lamotrigine. Per Epic, lamotrigine refilled x year 10/09/17.  Called [facility], spoke with [employer] who stated he has refills; the Rx that was last shipped to him June 28th was with sig: take 1 tab once daily. This RN advised that is incorrect directions. Emalene discontinued that RX. Call transferred to pharmacist, Legrand Como.  Andi Devon verbal refill: lamotrigine (Lamictal) 150 mg tabs, take one tab (150 mg total) twice daily, disp #180, 3 refills. Michael repeated Rx correctly , stated it would be prepared for patient.

## 2018-02-18 NOTE — Progress Notes (Signed)
[facility]  PATIENT: [name] [name] DOB: 04-Sep-1960   PCP : Dr. At  [facility] ,  formerly Sonia Baller, MD   Farrel Conners-  primary neurologist is Dr. Krista Blue, treatment of Epilepsy.    REASON FOR VISIT: follow up for "seizure disorder", OSA on CPAP  , here in the sleep clinic.  Followed by dr Krista Blue for  non-epileptic episodes and new complaint of worsening memory  HISTORY FROM: Patient here alone for CPAP compliance. [name] [name], 57 year old caucasian male is seen here on 02-18-2018, he is now disabled used to work in [employer], technical sales. He has OSA and used CPAP for years . He has been suffering a right shoulder and scapula  injury when his dog caused him to fall, surgery pending with Dr. Rhona Raider. He has been diagnosed with 9 kidney stones, these were removed surgically. He used a couple of days pain killers, was give #40 of percocet, but used sparingly.  He has a family history of addiction, brother and uncle affected.  The patient underwent a home sleep test on apnea link on 30 Oct 2017, goal was to replace his by that time 63-year-old machine.  The home sleep test confirmed an AHI of 32, RDI of 34.2/h, 32 minutes of oxygen desaturation under 89%.  The patient qualified with these results of course for new CPAP.  It was an auto titration capable machine with a pressure window between 6 and 16 cmH2O and 3 cm EPR mask of his choice.  He has used the machine compliantly for 93% of the last 30 days, 6 hours and 9 minutes of his average use of time, the residual AHI is 5.1 dated 02-15-18.  Pressure at the 95th percentile is 12 cmH2O air leaks are mild and have actually improved over the last 14 days.  He has tightened the headgear at that time he reports. Uses a FFM-   He remains depressed , sees a therapist.  He remains excessively daytime sleepy ; his current Epworth sleepiness score is endorsed at 21/24 points, fatigue severity at 30 / 63 points.  His residual AHI of 5.1/h can hardly explain the degree of  excessive daytime sleepiness - I wonder if it Crohn's , recovery from kidney surgery,  The severe depression or other metabolic or illness related factors.   Dr Rhea Belton interval visit :He had a past medical history of diabetes, hypertension, Crohn's disease, on immunosuppressive treatment, He presented with seizure-like event, reported initial event was June 28 2014, he was found by his wife to difficulty to be awake and eyes closed, took a while for him to open his eyes, and become very confused, also angry, episodes last about 5-10 minutes, there was also witnessed leg extension, jerking movement, He had multiple recurrent confusion episodes later, MRI of the brain, MRA of the brain in April 2013 was normal,EEG showed intermittent left anterior and medial temporal lobe slowing  Interval history from 03/11/2017, I have the pleasure of seeing [name] [name] once a year for compliance visit and his CPAP compliance is excellent. He is used to machine 97% of the time over 4 hours 100% of the last 30 days, his average user time of 6 hours 27 minutes the machine is set at 10 cm pressure this 2 cm expiratory pressure relief. His residual AHI is 2.8 but he does have high air leaks. These however does not did not distort the apnea count. He also feels that without this CPAP he cannot sleep as well. His fatigue  questionnaire was endorsed high at 47 points, his Epworth sleepiness score was endorsed at 21 points out of 24 possible points, also very high. He continues to feel depressed, and I was able to review Dr. Lynelle Smoke report which stated that the patient's major depression was interfering with some of the validity of cognitive testing. She felt that there was room for conversion disorder based on the patient's own feeling of worthlessness, hopelessness and sadness. [name] [name] reports that he had a seizure last night in spite of being compliant with his seizure medications.    REVIEW OF SYSTEMS: Full 14 system  review of systems performed and notable only for those listed, all others are neg:   The patient's Epworth sleepiness score is 21 points which is a high rating, fatigue severity is 47 which is equally high. He is in ongoing treatment for depression. Neurology Memory loss, seizure, headaches- Psychiatric Anxiety,  depressionSleep obstructive sleep apnea on CPAP. FFM - remains EDS excessively daytime sleepiness.     PAST MEDICAL HISTORY: urostomy, shoulder injury, renal stones. Depression, epilepsy.   PAST SURGICAL HISTORY: renal surgery 8-14 -2019,  Past Surgical History:  Procedure Laterality Date  . CATARACT EXTRACTION Bilateral   . COLONOSCOPY    . ELBOW SURGERY  2012   elbow MRSA infection   . EYE SURGERY     cataracts removed, /w IOL  . INSERTION OF MESH N/A 07/01/2016   Procedure: INSERTION OF MESH;  Surgeon: [name], MD;  Location: [facility];  Service: General;  Laterality: N/A;  . IR URETERAL STENT LEFT NEW ACCESS W/O SEP NEPHROSTOMY CATH  02/02/2018  . NEPHROLITHOTOMY Left 02/02/2018   Procedure: NEPHROLITHOTOMY PERCUTANEOUS;  Surgeon: [name], MD;  Location: [facility];  Service: Urology;  Laterality: Left;  . SCALP LACERATION REPAIR Right 10/16/2017   From fall/staples  . SHOULDER SURGERY    . SINUS SURGERY WITH INSTATRAK    . TEE WITHOUT CARDIOVERSION N/A 05/09/2017   Procedure: TRANSESOPHAGEAL ECHOCARDIOGRAM (TEE);  Surgeon: [name], MD;  Location: [facility];  Service: Cardiovascular;  Laterality: N/A;  . TRANSTHORACIC ECHOCARDIOGRAM  02/22/2011   EF 55-65%; increased pattern of LVH with mild conc hypertrophy, abnormal relaxation & increased filling pressure (grade 2 diastolic dysfunction); atrial septum thickened (lipomatous hypertrophy)  . UMBILICAL HERNIA REPAIR N/A 07/01/2016   Procedure: UMBILICAL HERNIA REPAIR WITH MESH;  Surgeon: [name], MD;  Location: [facility];  Service: General;  Laterality: N/A;  . UPPER GASTROINTESTINAL ENDOSCOPY    . VASECTOMY       FAMILY HISTORY: Family History  Problem Relation Age of Onset  . Colon polyps Father   . Stroke Father   . Hyperlipidemia Father   . Hypertension Father   . Heart disease Mother        CABG at age 56  . Hyperlipidemia Mother   . Hypertension Mother   . Stroke Paternal Grandmother   . Stroke Paternal Grandfather   . Other Child        tetrology of fallot (cornealia deland syndrome)  . Colon cancer Neg Hx   . Esophageal cancer Neg Hx   . Stomach cancer Neg Hx   . Rectal cancer Neg Hx    CORNELIA DE LANGE SYNDROME - child    SOCIAL HISTORY: Social History   Socioeconomic History  . Marital status: Married    Spouse name: [name]  . Number of children: 2  . Years of education: college  . Highest education level: Not on file  Occupational History  . Occupation: disabled    Comment: seizure disorder with memory impairment  Social Needs  . Financial resource strain: Not on file  . Food insecurity:    Worry: Not on file    Inability: Not on file  . Transportation needs:    Medical: Not on file    Non-medical: Not on file  Tobacco Use  . Smoking status: Never Smoker  . Smokeless tobacco: Never Used  Substance and Sexual Activity  . Alcohol use: No  . Drug use: No  . Sexual activity: Yes    Birth control/protection: Surgical    Comment: 1 partner in last 12 months  Lifestyle  . Physical activity:    Days per week: Not on file    Minutes per session: Not on file  . Stress: Not on file  Relationships  . Social connections:    Talks on phone: Not on file    Gets together: Not on file    Attends religious service: Not on file    Active member of club or organization: Not on file    Attends meetings of clubs or organizations: Not on file    Relationship status: Not on file  . Intimate partner violence:    Fear of current or ex partner: Not on file    Emotionally abused: Not on file    Physically abused: Not on file    Forced sexual activity: Not on file    Other Topics Concern  . Not on file  Social History Narrative   Marital status: married x 32 years;       Lives:   lives at home with his family.      Children:  two children; no grandchildren.  Son is special needs at age 31yo; Cornelian Delaine with TOF.       Employment: unemployed; [employer] long term disability      Tobacco: none      Alcohol: none      Drugs: none      Exercise: unable to exercise due to frequent falls   Patient has a college education.   Patient is right-handed.   Patient drinks one cup of soda daily.     PHYSICAL EXAM  Vitals:   02/18/18 1117  BP: (!) 144/88  Pulse: 99  Weight: 210 lb (95.3 kg)  Height: 5' 11"  (1.803 m)   Body mass index is 29.29 kg/m. Generalized: Well developed, in no acute distress  Head: normocephalic and atraumatic,Oropharynx benign . Skin : very ruddy facial skin. Sun damaged. Multiple squamous cell carcinomata .  Crohn's disease -    The patient has a neck circumference of 17-1/2 inches,  Mallampati grade 4, no sign of tongue bite.  Neck: Supple, no carotid bruits  Cardiac: Regular rate rhythm, no murmur  Musculoskeletal: right shoulder ROM restricted.   Mentation: Alert oriented to time, place, history taking. Attention span and concentration appropriate.   Cranial nerves: taste and smell preserved.   Pupils were equal round reactive to light extraocular movements were full, visual field were full on confrontational test. Facial sensation and strength were normal. hearing was intact to finger rubbing bilaterally. Uvula and tongue move in midline, head turning and shoulder shrug were normal and symmetric. Tongue protrusion into cheek strength was normal. Reflexes: Bilateral equal 2+. Gait and Station: Rising up from seated position without assistance, normal stance,  DIAGNOSTIC DATA (LABS, IMAGING, TESTING)  CPAP download.   Lab Results  Component Value Date  WBC 3.6 (L) 02/02/2018   HGB 10.7 (L)  02/03/2018   HCT 29.9 (L) 02/03/2018   MCV 109.4 (H) 02/02/2018   PLT 88 (L) 02/02/2018      Component Value Date/Time   NA 144 02/02/2018 0802   NA 145 (H) 01/05/2018 1644   K 3.4 (L) 02/02/2018 0802   CL 113 (H) 02/02/2018 0802   CO2 25 02/02/2018 0802   GLUCOSE 109 (H) 02/02/2018 0802   BUN 11 02/02/2018 0802   BUN 11 01/05/2018 1644   CREATININE 0.86 02/02/2018 0802   CREATININE 0.84 05/15/2016 1005   CALCIUM 8.9 02/02/2018 0802   PROT 6.8 01/26/2018 1145   PROT 6.3 01/05/2018 1644   ALBUMIN 4.2 01/26/2018 1145   ALBUMIN 4.4 01/05/2018 1644   AST 37 01/26/2018 1145   ALT 21 01/26/2018 1145   ALKPHOS 216 (H) 01/26/2018 1145   BILITOT 2.7 (H) 01/26/2018 1145   BILITOT 3.3 (H) 01/05/2018 1644   GFRNONAA >60 02/02/2018 0802   GFRNONAA >89 06/03/2015 0858   GFRAA >60 02/02/2018 0802   GFRAA >89 06/03/2015 0858   Lab Results  Component Value Date   CHOL 165 10/27/2017   HDL 83 10/27/2017   LDLCALC 64 10/27/2017   TRIG 89 10/27/2017   CHOLHDL 2.0 10/27/2017   Lab Results  Component Value Date   HGBA1C 4.0 (L) 01/26/2018   Lab Results  Component Value Date   VITAMINB12 >2000 (H) 06/04/2017   Lab Results  Component Value Date   TSH 2.57 08/27/2017      ASSESSMENT AND PLAN 57 y.o. year old right handed Caucasian married male patient , here for a 20 minute compliance visit dedicated to CPAP use and sleep apnea and its clinical impact only. Multiple 50% of the face-to-face time is used for discussion of the results of Dr Geri Seminole recent testing. ,  CPAP download: continue at current settings.  Hypertension;Complex partial seizure ([facility]); Migraines; Seizures ([place]). Bilirubin is high,and all cell's counts are low- needs a repeat CBC and metabolic panel. Started on oral iron, but cannot easily tolerated with crohn's Disease.  Cannot eat red meat.  Try prenatal vitamins.   Need to follow up CPAP - EDS, for sleep issues- needs compliance visit once a year, to see  NP.  This will not affect his seizure clinic visit with Dr Krista Blue.   Larey Seat, MD  [facility] [address] [place], [place] [address] [phone]   CC DR Gypsy Balsam, NP

## 2018-02-18 NOTE — Patient Instructions (Signed)
Hypersomnia Hypersomnia is when you feel extremely tired during the day even though you're getting plenty of sleep at night. You may need to take naps during the day, and you may also be extremely difficult to wake up when you are sleeping. What are the causes? The cause of your hypersomnia may not be known. Hypersomnia may be caused by:  Medicines.  Sleep disorders, such as narcolepsy.  Trauma or injury to your head or nervous system.  Using drugs or alcohol.  Tumors.  Medical conditions, such as depression or hypothyroidism.  Genetics.  What are the signs or symptoms? The main symptoms of hypersomnia include:  Feeling extremely tired throughout the day.  Being very difficult to wake up.  Sleeping for longer and longer periods.  Taking naps throughout the day.  Other symptoms may include:  Feeling: ? Restless. ? Annoyed. ? Anxious. ? Low energy.  Having difficulty: ? Remembering. ? Speaking. ? Thinking.  Losing your appetite.  Experiencing hallucinations.  How is this diagnosed? Hypersomnia may be diagnosed by:  Medical history and physical exam. This will include a sleep history.  Completing sleep logs.  Tests may also be done, such as: ? Polysomnography. ? Multiple sleep latency test (MSLT).  How is this treated? There is no cure for hypersomnia, but treatment can be very effective in helping manage the condition. Treatment may include:  Lifestyle and sleeping strategies to help cope with the condition.  Stimulant medicines.  Treating any underlying causes of hypersomnia.  Follow these instructions at home:  Take medicines only as directed by your health care provider.  Schedule short naps for when you feel sleepiest during the day. Tell your employer or teachers that you have hypersomnia. You may be able to adjust your schedule to include time for naps.  Avoid drinking alcohol or caffeinated beverages.  Do not eat a heavy meal before  bedtime. Eat at about the same times every day.  Do not drive or operate heavy machinery if you are sleepy.  Do not swim or go out on the water without a life jacket.  If possible, adjust your schedule so that you do not have to work or be active at night.  Keep all follow-up visits as directed by your health care provider. This is important. Contact a health care provider if:  You have new symptoms.  Your symptoms get worse. Get help right away if: You have serious thoughts of hurting yourself or someone else. This information is not intended to replace advice given to you by your health care provider. Make sure you discuss any questions you have with your health care provider. Document Released: 05/31/2002 Document Revised: 11/16/2015 Document Reviewed: 01/13/2014 Elsevier Interactive Patient Education  [employer].

## 2018-02-18 NOTE — Addendum Note (Signed)
Addended by: Larey Seat on: 02/18/2018 12:04 PM   Modules accepted: Orders

## 2018-02-19 LAB — CBC WITH DIFFERENTIAL/PLATELET
Basophils Absolute: 0.1 10*3/uL (ref 0.0–0.2)
Basos: 1 %
EOS (ABSOLUTE): 0.3 10*3/uL (ref 0.0–0.4)
Eos: 8 %
Hematocrit: 31.9 % — ABNORMAL LOW (ref 37.5–51.0)
Hemoglobin: 11.1 g/dL — ABNORMAL LOW (ref 13.0–17.7)
IMMATURE GRANS (ABS): 0 10*3/uL (ref 0.0–0.1)
IMMATURE GRANULOCYTES: 1 %
LYMPHS: 15 %
Lymphocytes Absolute: 0.6 10*3/uL — ABNORMAL LOW (ref 0.7–3.1)
MCH: 37.9 pg — ABNORMAL HIGH (ref 26.6–33.0)
MCHC: 34.8 g/dL (ref 31.5–35.7)
MCV: 109 fL — AB (ref 79–97)
Monocytes Absolute: 0.4 10*3/uL (ref 0.1–0.9)
Monocytes: 10 %
NEUTROS ABS: 2.6 10*3/uL (ref 1.4–7.0)
NEUTROS PCT: 65 %
PLATELETS: 98 10*3/uL — AB (ref 150–450)
RBC: 2.93 x10E6/uL — ABNORMAL LOW (ref 4.14–5.80)
RDW: 14.6 % (ref 12.3–15.4)
WBC: 4 10*3/uL (ref 3.4–10.8)

## 2018-02-19 LAB — IRON,TIBC AND FERRITIN PANEL
Ferritin: 475 ng/mL — ABNORMAL HIGH (ref 30–400)
IRON SATURATION: 33 % (ref 15–55)
Iron: 99 ug/dL (ref 38–169)
Total Iron Binding Capacity: 298 ug/dL (ref 250–450)
UIBC: 199 ug/dL (ref 111–343)

## 2018-02-19 LAB — COMPREHENSIVE METABOLIC PANEL
A/G RATIO: 1.8 (ref 1.2–2.2)
ALK PHOS: 218 IU/L — AB (ref 39–117)
ALT: 17 IU/L (ref 0–44)
AST: 28 IU/L (ref 0–40)
Albumin: 4.1 g/dL (ref 3.5–5.5)
BUN/Creatinine Ratio: 17 (ref 9–20)
BUN: 16 mg/dL (ref 6–24)
Bilirubin Total: 1.9 mg/dL — ABNORMAL HIGH (ref 0.0–1.2)
CALCIUM: 9 mg/dL (ref 8.7–10.2)
CHLORIDE: 109 mmol/L — AB (ref 96–106)
CO2: 21 mmol/L (ref 20–29)
Creatinine, Ser: 0.96 mg/dL (ref 0.76–1.27)
GFR calc Af Amer: 102 mL/min/{1.73_m2} (ref >59)
GFR, EST NON AFRICAN AMERICAN: 88 mL/min/{1.73_m2} (ref >59)
Globulin, Total: 2.3 g/dL (ref 1.5–4.5)
Glucose: 119 mg/dL — ABNORMAL HIGH (ref 65–99)
POTASSIUM: 4.1 mmol/L (ref 3.5–5.2)
Sodium: 144 mmol/L (ref 134–144)
Total Protein: 6.4 g/dL (ref 6.0–8.5)

## 2018-03-02 ENCOUNTER — Other Ambulatory Visit: Payer: Self-pay | Admitting: Orthopaedic Surgery

## 2018-03-04 DIAGNOSIS — G4733 Obstructive sleep apnea (adult) (pediatric): Secondary | ICD-10-CM | POA: Diagnosis not present

## 2018-03-04 MED ORDER — VANCOMYCIN HCL IN DEXTROSE 1-5 GM/200ML-% IV SOLN
1000.0000 mg | INTRAVENOUS | Status: AC
  Administered 2018-03-05: 1000 mg via INTRAVENOUS

## 2018-03-04 NOTE — Progress Notes (Signed)
Spoke with patients wife. No change in medical history from recent surgery for kidney stone removal in August. Instructions given re medications arrival time and NPO.

## 2018-03-05 ENCOUNTER — Encounter (HOSPITAL_COMMUNITY)
Admission: RE | Disposition: A | Discharge status: Home / Self Care | Payer: Self-pay | Source: Ambulatory Visit | Attending: Orthopaedic Surgery

## 2018-03-05 ENCOUNTER — Observation Stay (HOSPITAL_COMMUNITY)
Admission: RE | Admit: 2018-03-05 | Discharge: 2018-03-06 | Disposition: A | Discharge status: Home / Self Care | Payer: 59 | Source: Ambulatory Visit | Attending: Orthopaedic Surgery | Admitting: Orthopaedic Surgery

## 2018-03-05 ENCOUNTER — Ambulatory Visit (HOSPITAL_COMMUNITY): Payer: 59 | Admitting: Anesthesiology

## 2018-03-05 ENCOUNTER — Other Ambulatory Visit: Payer: Self-pay

## 2018-03-05 ENCOUNTER — Encounter (HOSPITAL_COMMUNITY): Payer: Self-pay | Admitting: Certified Registered"

## 2018-03-05 DIAGNOSIS — K219 Gastro-esophageal reflux disease without esophagitis: Secondary | ICD-10-CM | POA: Insufficient documentation

## 2018-03-05 DIAGNOSIS — E785 Hyperlipidemia, unspecified: Secondary | ICD-10-CM | POA: Diagnosis not present

## 2018-03-05 DIAGNOSIS — K5080 Crohn's disease of both small and large intestine without complications: Secondary | ICD-10-CM | POA: Diagnosis not present

## 2018-03-05 DIAGNOSIS — F4310 Post-traumatic stress disorder, unspecified: Secondary | ICD-10-CM | POA: Insufficient documentation

## 2018-03-05 DIAGNOSIS — I10 Essential (primary) hypertension: Secondary | ICD-10-CM | POA: Insufficient documentation

## 2018-03-05 DIAGNOSIS — M25811 Other specified joint disorders, right shoulder: Secondary | ICD-10-CM | POA: Diagnosis not present

## 2018-03-05 DIAGNOSIS — G4730 Sleep apnea, unspecified: Secondary | ICD-10-CM | POA: Diagnosis not present

## 2018-03-05 DIAGNOSIS — Z7984 Long term (current) use of oral hypoglycemic drugs: Secondary | ICD-10-CM | POA: Diagnosis not present

## 2018-03-05 DIAGNOSIS — S42031K Displaced fracture of lateral end of right clavicle, subsequent encounter for fracture with nonunion: Secondary | ICD-10-CM | POA: Diagnosis not present

## 2018-03-05 DIAGNOSIS — Z85828 Personal history of other malignant neoplasm of skin: Secondary | ICD-10-CM | POA: Diagnosis not present

## 2018-03-05 DIAGNOSIS — Z7982 Long term (current) use of aspirin: Secondary | ICD-10-CM | POA: Insufficient documentation

## 2018-03-05 DIAGNOSIS — Z87442 Personal history of urinary calculi: Secondary | ICD-10-CM | POA: Insufficient documentation

## 2018-03-05 DIAGNOSIS — G8918 Other acute postprocedural pain: Secondary | ICD-10-CM | POA: Diagnosis not present

## 2018-03-05 DIAGNOSIS — M7541 Impingement syndrome of right shoulder: Secondary | ICD-10-CM | POA: Insufficient documentation

## 2018-03-05 DIAGNOSIS — S42001K Fracture of unspecified part of right clavicle, subsequent encounter for fracture with nonunion: Secondary | ICD-10-CM | POA: Diagnosis not present

## 2018-03-05 DIAGNOSIS — E119 Type 2 diabetes mellitus without complications: Secondary | ICD-10-CM | POA: Diagnosis not present

## 2018-03-05 DIAGNOSIS — F329 Major depressive disorder, single episode, unspecified: Secondary | ICD-10-CM | POA: Insufficient documentation

## 2018-03-05 DIAGNOSIS — Z9889 Other specified postprocedural states: Secondary | ICD-10-CM

## 2018-03-05 DIAGNOSIS — M75111 Incomplete rotator cuff tear or rupture of right shoulder, not specified as traumatic: Principal | ICD-10-CM | POA: Insufficient documentation

## 2018-03-05 DIAGNOSIS — Z79899 Other long term (current) drug therapy: Secondary | ICD-10-CM | POA: Insufficient documentation

## 2018-03-05 DIAGNOSIS — M75101 Unspecified rotator cuff tear or rupture of right shoulder, not specified as traumatic: Secondary | ICD-10-CM | POA: Diagnosis not present

## 2018-03-05 HISTORY — PX: SHOULDER ARTHROSCOPY: SHX128

## 2018-03-05 LAB — CBC
HCT: 33.2 % — ABNORMAL LOW (ref 39.0–52.0)
HEMOGLOBIN: 11.6 g/dL — AB (ref 13.0–17.0)
MCH: 38 pg — ABNORMAL HIGH (ref 26.0–34.0)
MCHC: 34.9 g/dL (ref 30.0–36.0)
MCV: 108.9 fL — ABNORMAL HIGH (ref 78.0–100.0)
PLATELETS: 74 10*3/uL — AB (ref 150–400)
RBC: 3.05 MIL/uL — AB (ref 4.22–5.81)
RDW: 14.9 % (ref 11.5–15.5)
WBC: 3.4 10*3/uL — AB (ref 4.0–10.5)

## 2018-03-05 LAB — GLUCOSE, CAPILLARY
GLUCOSE-CAPILLARY: 105 mg/dL — AB (ref 70–99)
Glucose-Capillary: 102 mg/dL — ABNORMAL HIGH (ref 70–99)

## 2018-03-05 LAB — BASIC METABOLIC PANEL
ANION GAP: 6 (ref 5–15)
BUN: 9 mg/dL (ref 6–20)
CALCIUM: 8.6 mg/dL — AB (ref 8.9–10.3)
CO2: 23 mmol/L (ref 22–32)
CREATININE: 0.91 mg/dL (ref 0.61–1.24)
Chloride: 114 mmol/L — ABNORMAL HIGH (ref 98–111)
GFR calc non Af Amer: >60 mL/min (ref >60)
Glucose, Bld: 103 mg/dL — ABNORMAL HIGH (ref 70–99)
Potassium: 3.5 mmol/L (ref 3.5–5.1)
Sodium: 143 mmol/L (ref 135–145)

## 2018-03-05 SURGERY — ARTHROSCOPY, SHOULDER
Anesthesia: General | Laterality: Right

## 2018-03-05 MED ORDER — MIDAZOLAM HCL 2 MG/2ML IJ SOLN
INTRAMUSCULAR | Status: AC
  Administered 2018-03-05: 1 mg via INTRAVENOUS
  Filled 2018-03-05: qty 2

## 2018-03-05 MED ORDER — METHOCARBAMOL 1000 MG/10ML IJ SOLN
500.0000 mg | Freq: Four times a day (QID) | INTRAVENOUS | Status: DC | PRN
  Filled 2018-03-05: qty 5

## 2018-03-05 MED ORDER — MEPERIDINE HCL 50 MG/ML IJ SOLN: 6.2500 mg | INTRAMUSCULAR | Status: DC | PRN

## 2018-03-05 MED ORDER — LAMOTRIGINE 100 MG PO TABS
150.0000 mg | ORAL_TABLET | Freq: Two times a day (BID) | ORAL | Status: DC
  Administered 2018-03-05 – 2018-03-06 (×2): 150 mg via ORAL
  Filled 2018-03-05 (×2): qty 2

## 2018-03-05 MED ORDER — DEXAMETHASONE SODIUM PHOSPHATE 10 MG/ML IJ SOLN
INTRAMUSCULAR | Status: DC | PRN
  Administered 2018-03-05: 5 mg via INTRAVENOUS

## 2018-03-05 MED ORDER — PANTOPRAZOLE SODIUM 40 MG PO TBEC: 80.0000 mg | DELAYED_RELEASE_TABLET | Freq: Every day | ORAL | Status: DC

## 2018-03-05 MED ORDER — METOCLOPRAMIDE HCL 5 MG PO TABS: 5.0000 mg | ORAL_TABLET | Freq: Three times a day (TID) | ORAL | Status: DC | PRN

## 2018-03-05 MED ORDER — ONDANSETRON HCL 4 MG PO TABS: 4.0000 mg | ORAL_TABLET | Freq: Four times a day (QID) | ORAL | Status: DC | PRN

## 2018-03-05 MED ORDER — SUCCINYLCHOLINE CHLORIDE 200 MG/10ML IV SOSY
PREFILLED_SYRINGE | INTRAVENOUS | Status: AC
  Filled 2018-03-05: qty 10

## 2018-03-05 MED ORDER — DOCUSATE SODIUM 100 MG PO CAPS
100.0000 mg | ORAL_CAPSULE | Freq: Two times a day (BID) | ORAL | Status: DC
  Administered 2018-03-05 – 2018-03-06 (×2): 100 mg via ORAL
  Filled 2018-03-05 (×2): qty 1

## 2018-03-05 MED ORDER — ROCURONIUM BROMIDE 10 MG/ML (PF) SYRINGE
PREFILLED_SYRINGE | INTRAVENOUS | Status: DC | PRN
  Administered 2018-03-05: 50 mg via INTRAVENOUS

## 2018-03-05 MED ORDER — POTASSIUM CHLORIDE CRYS ER 20 MEQ PO TBCR
20.0000 meq | EXTENDED_RELEASE_TABLET | Freq: Two times a day (BID) | ORAL | Status: DC
  Administered 2018-03-05 – 2018-03-06 (×2): 20 meq via ORAL
  Filled 2018-03-05 (×2): qty 1

## 2018-03-05 MED ORDER — LIDOCAINE 2% (20 MG/ML) 5 ML SYRINGE
INTRAMUSCULAR | Status: AC
  Filled 2018-03-05: qty 5

## 2018-03-05 MED ORDER — MERCAPTOPURINE 50 MG PO TABS
100.0000 mg | ORAL_TABLET | Freq: Every day | ORAL | Status: DC
  Administered 2018-03-06: 100 mg via ORAL
  Filled 2018-03-05: qty 2

## 2018-03-05 MED ORDER — METHSCOPOLAMINE BROMIDE 5 MG PO TABS: 5.0000 mg | ORAL_TABLET | Freq: Two times a day (BID) | ORAL | Status: DC

## 2018-03-05 MED ORDER — CLONIDINE HCL (ANALGESIA) 100 MCG/ML EP SOLN
EPIDURAL | Status: DC | PRN
  Administered 2018-03-05: 50 ug

## 2018-03-05 MED ORDER — MIDAZOLAM HCL 2 MG/2ML IJ SOLN
INTRAMUSCULAR | Status: AC
  Filled 2018-03-05: qty 2

## 2018-03-05 MED ORDER — LACTATED RINGERS IV SOLN
INTRAVENOUS | Status: DC
  Administered 2018-03-05: 19:00:00 via INTRAVENOUS

## 2018-03-05 MED ORDER — METOCLOPRAMIDE HCL 5 MG/ML IJ SOLN: 5.0000 mg | Freq: Three times a day (TID) | INTRAMUSCULAR | Status: DC | PRN

## 2018-03-05 MED ORDER — ZOLPIDEM TARTRATE 5 MG PO TABS: 5.0000 mg | ORAL_TABLET | Freq: Every evening | ORAL | Status: DC | PRN

## 2018-03-05 MED ORDER — PHENYLEPHRINE 40 MCG/ML (10ML) SYRINGE FOR IV PUSH (FOR BLOOD PRESSURE SUPPORT)
PREFILLED_SYRINGE | INTRAVENOUS | Status: AC
  Filled 2018-03-05: qty 10

## 2018-03-05 MED ORDER — SODIUM CHLORIDE 0.9 % IV SOLN
INTRAVENOUS | Status: DC | PRN
  Administered 2018-03-05: 40 ug/min via INTRAVENOUS

## 2018-03-05 MED ORDER — ASPIRIN EC 81 MG PO TBEC: 81.0000 mg | DELAYED_RELEASE_TABLET | Freq: Every day | ORAL | Status: DC

## 2018-03-05 MED ORDER — PROMETHAZINE HCL 25 MG/ML IJ SOLN: 6.2500 mg | INTRAMUSCULAR | Status: DC | PRN

## 2018-03-05 MED ORDER — RIFAXIMIN 550 MG PO TABS
550.0000 mg | ORAL_TABLET | Freq: Two times a day (BID) | ORAL | Status: DC
  Administered 2018-03-05 – 2018-03-06 (×2): 550 mg via ORAL
  Filled 2018-03-05 (×2): qty 1

## 2018-03-05 MED ORDER — MORPHINE SULFATE (PF) 2 MG/ML IV SOLN: 0.5000 mg | INTRAVENOUS | Status: DC | PRN

## 2018-03-05 MED ORDER — ONDANSETRON HCL 4 MG/2ML IJ SOLN
INTRAMUSCULAR | Status: DC | PRN
  Administered 2018-03-05: 4 mg via INTRAVENOUS

## 2018-03-05 MED ORDER — ONDANSETRON HCL 4 MG/2ML IJ SOLN: 4.0000 mg | Freq: Four times a day (QID) | INTRAMUSCULAR | Status: DC | PRN

## 2018-03-05 MED ORDER — HYDROCODONE-ACETAMINOPHEN 7.5-325 MG PO TABS
1.0000 | ORAL_TABLET | ORAL | Status: DC | PRN
  Administered 2018-03-05: 1 via ORAL
  Administered 2018-03-06: 2 via ORAL
  Filled 2018-03-05: qty 1
  Filled 2018-03-05: qty 2

## 2018-03-05 MED ORDER — MIDAZOLAM HCL 2 MG/2ML IJ SOLN
1.0000 mg | Freq: Once | INTRAMUSCULAR | Status: AC
  Administered 2018-03-05: 1 mg via INTRAVENOUS

## 2018-03-05 MED ORDER — ROCURONIUM BROMIDE 50 MG/5ML IV SOSY
PREFILLED_SYRINGE | INTRAVENOUS | Status: AC
  Filled 2018-03-05: qty 5

## 2018-03-05 MED ORDER — LACTATED RINGERS IV SOLN
INTRAVENOUS | Status: DC
  Administered 2018-03-05: 12:00:00 via INTRAVENOUS

## 2018-03-05 MED ORDER — PROPOFOL 10 MG/ML IV BOLUS
INTRAVENOUS | Status: AC
  Filled 2018-03-05: qty 20

## 2018-03-05 MED ORDER — ACETAMINOPHEN 325 MG PO TABS: 325.0000 mg | ORAL_TABLET | Freq: Four times a day (QID) | ORAL | Status: DC | PRN

## 2018-03-05 MED ORDER — ROPIVACAINE HCL 5 MG/ML IJ SOLN
INTRAMUSCULAR | Status: DC | PRN
  Administered 2018-03-05: 30 mL via PERINEURAL

## 2018-03-05 MED ORDER — SCOPOLAMINE 1 MG/3DAYS TD PT72
1.0000 | MEDICATED_PATCH | Freq: Once | TRANSDERMAL | Status: DC
  Administered 2018-03-05: 1.5 mg via TRANSDERMAL

## 2018-03-05 MED ORDER — DEXAMETHASONE SODIUM PHOSPHATE 10 MG/ML IJ SOLN
INTRAMUSCULAR | Status: AC
  Filled 2018-03-05: qty 1

## 2018-03-05 MED ORDER — MIRTAZAPINE 15 MG PO TABS
30.0000 mg | ORAL_TABLET | Freq: Every day | ORAL | Status: DC
  Administered 2018-03-05: 30 mg via ORAL
  Filled 2018-03-05: qty 2

## 2018-03-05 MED ORDER — METHOCARBAMOL 500 MG PO TABS
500.0000 mg | ORAL_TABLET | Freq: Four times a day (QID) | ORAL | Status: DC | PRN
  Administered 2018-03-05: 500 mg via ORAL
  Filled 2018-03-05: qty 1

## 2018-03-05 MED ORDER — HYDROCODONE-ACETAMINOPHEN 7.5-325 MG PO TABS: 1.0000 | ORAL_TABLET | Freq: Once | ORAL | Status: DC | PRN

## 2018-03-05 MED ORDER — ONDANSETRON HCL 4 MG/2ML IJ SOLN
INTRAMUSCULAR | Status: AC
  Filled 2018-03-05: qty 2

## 2018-03-05 MED ORDER — MESALAMINE 1.2 G PO TBEC
2.4000 g | DELAYED_RELEASE_TABLET | Freq: Every day | ORAL | Status: DC
  Administered 2018-03-06: 2.4 g via ORAL
  Filled 2018-03-05: qty 2

## 2018-03-05 MED ORDER — PHENYLEPHRINE 40 MCG/ML (10ML) SYRINGE FOR IV PUSH (FOR BLOOD PRESSURE SUPPORT)
PREFILLED_SYRINGE | INTRAVENOUS | Status: DC | PRN
  Administered 2018-03-05: 80 ug via INTRAVENOUS

## 2018-03-05 MED ORDER — HYDROCODONE-ACETAMINOPHEN 5-325 MG PO TABS: 1.0000 | ORAL_TABLET | ORAL | Status: DC | PRN

## 2018-03-05 MED ORDER — SCOPOLAMINE 1 MG/3DAYS TD PT72
MEDICATED_PATCH | TRANSDERMAL | Status: AC
  Administered 2018-03-05: 1.5 mg via TRANSDERMAL
  Filled 2018-03-05: qty 1

## 2018-03-05 MED ORDER — HYDROMORPHONE HCL 1 MG/ML IJ SOLN: 0.2500 mg | INTRAMUSCULAR | Status: DC | PRN

## 2018-03-05 MED ORDER — FENTANYL CITRATE (PF) 100 MCG/2ML IJ SOLN
50.0000 ug | Freq: Once | INTRAMUSCULAR | Status: AC
  Administered 2018-03-05: 50 ug via INTRAVENOUS

## 2018-03-05 MED ORDER — SODIUM CHLORIDE 0.9 % IR SOLN
Status: DC | PRN
  Administered 2018-03-05 (×2): 3000 mL

## 2018-03-05 MED ORDER — FENTANYL CITRATE (PF) 250 MCG/5ML IJ SOLN
INTRAMUSCULAR | Status: AC
  Filled 2018-03-05: qty 5

## 2018-03-05 MED ORDER — FOLIC ACID 1 MG PO TABS
2.0000 mg | ORAL_TABLET | Freq: Every day | ORAL | Status: DC
  Administered 2018-03-06: 2 mg via ORAL
  Filled 2018-03-05: qty 2

## 2018-03-05 MED ORDER — DIPHENHYDRAMINE HCL 12.5 MG/5ML PO ELIX: 12.5000 mg | ORAL_SOLUTION | ORAL | Status: DC | PRN

## 2018-03-05 MED ORDER — LIDOCAINE 2% (20 MG/ML) 5 ML SYRINGE
INTRAMUSCULAR | Status: DC | PRN
  Administered 2018-03-05: 100 mg via INTRAVENOUS

## 2018-03-05 MED ORDER — PHENYLEPHRINE HCL 10 MG/ML IJ SOLN
INTRAMUSCULAR | Status: AC
  Filled 2018-03-05: qty 1

## 2018-03-05 MED ORDER — SERTRALINE HCL 100 MG PO TABS
200.0000 mg | ORAL_TABLET | Freq: Every day | ORAL | Status: DC
  Administered 2018-03-06: 200 mg via ORAL
  Filled 2018-03-05: qty 2

## 2018-03-05 MED ORDER — CHLORHEXIDINE GLUCONATE 4 % EX LIQD: 60.0000 mL | Freq: Once | CUTANEOUS | Status: DC

## 2018-03-05 MED ORDER — FENTANYL CITRATE (PF) 250 MCG/5ML IJ SOLN
INTRAMUSCULAR | Status: DC | PRN
  Administered 2018-03-05: 100 ug via INTRAVENOUS

## 2018-03-05 MED ORDER — 0.9 % SODIUM CHLORIDE (POUR BTL) OPTIME
TOPICAL | Status: DC | PRN
  Administered 2018-03-05: 1000 mL

## 2018-03-05 MED ORDER — FENTANYL CITRATE (PF) 100 MCG/2ML IJ SOLN
INTRAMUSCULAR | Status: AC
  Administered 2018-03-05: 50 ug via INTRAVENOUS
  Filled 2018-03-05: qty 2

## 2018-03-05 MED ORDER — PROMETHAZINE HCL 25 MG/ML IJ SOLN: 6.2500 mg | Freq: Four times a day (QID) | INTRAMUSCULAR | Status: DC | PRN

## 2018-03-05 MED ORDER — PROPOFOL 10 MG/ML IV BOLUS
INTRAVENOUS | Status: DC | PRN
  Administered 2018-03-05: 140 mg via INTRAVENOUS

## 2018-03-05 MED ORDER — ACETAMINOPHEN 10 MG/ML IV SOLN: 1000.0000 mg | Freq: Once | INTRAVENOUS | Status: DC | PRN

## 2018-03-05 MED ORDER — PROMETHAZINE HCL 25 MG PO TABS: 12.5000 mg | ORAL_TABLET | Freq: Four times a day (QID) | ORAL | Status: DC | PRN

## 2018-03-05 SURGICAL SUPPLY — 54 items
BLADE AVERAGE 25MMX9MM (BLADE) ×1
BLADE AVERAGE 25X9 (BLADE) ×1 IMPLANT
BLADE GREAT WHITE 4.2 (BLADE) ×2 IMPLANT
BLADE GREAT WHITE 4.2MM (BLADE) ×1
BUR VERTEX HOODED 4.5 (BURR) ×3 IMPLANT
CANNULA SHOULDER 7CM (CANNULA) ×5 IMPLANT
CANNULA TWIST IN 8.25X7CM (CANNULA) IMPLANT
CLOSURE STERI-STRIP 1/2X4 (GAUZE/BANDAGES/DRESSINGS) ×1
CLSR STERI-STRIP ANTIMIC 1/2X4 (GAUZE/BANDAGES/DRESSINGS) ×1 IMPLANT
COVER SURGICAL LIGHT HANDLE (MISCELLANEOUS) IMPLANT
DRAPE IMP U-DRAPE 54X76 (DRAPES) ×2 IMPLANT
DRAPE OEC MINIVIEW 54X84 (DRAPES) ×2 IMPLANT
DRAPE ORTHO SPLIT 77X108 STRL (DRAPES) ×6
DRAPE STERI 35X30 U-POUCH (DRAPES) ×3 IMPLANT
DRAPE SURG ORHT 6 SPLT 77X108 (DRAPES) ×2 IMPLANT
DRAPE U-SHAPE 47X51 STRL (DRAPES) ×3 IMPLANT
DRSG EMULSION OIL 3X3 NADH (GAUZE/BANDAGES/DRESSINGS) ×3 IMPLANT
DRSG PAD ABDOMINAL 8X10 ST (GAUZE/BANDAGES/DRESSINGS) ×3 IMPLANT
DURAPREP 26ML APPLICATOR (WOUND CARE) ×3 IMPLANT
ELECT REM PT RETURN 9FT ADLT (ELECTROSURGICAL) ×3
ELECTRODE REM PT RTRN 9FT ADLT (ELECTROSURGICAL) IMPLANT
GAUZE SPONGE 4X4 12PLY STRL (GAUZE/BANDAGES/DRESSINGS) ×3 IMPLANT
GLOVE BIO SURGEON STRL SZ8 (GLOVE) ×3 IMPLANT
GLOVE BIOGEL PI IND STRL 8 (GLOVE) ×2 IMPLANT
GLOVE BIOGEL PI INDICATOR 8 (GLOVE) ×4
GLOVE SS N UNI LF 8.0 STRL (GLOVE) ×3 IMPLANT
GOWN STRL REUS W/ TWL LRG LVL3 (GOWN DISPOSABLE) ×1 IMPLANT
GOWN STRL REUS W/ TWL XL LVL3 (GOWN DISPOSABLE) ×2 IMPLANT
GOWN STRL REUS W/TWL LRG LVL3 (GOWN DISPOSABLE) ×3
GOWN STRL REUS W/TWL XL LVL3 (GOWN DISPOSABLE) ×6
KIT BASIN OR (CUSTOM PROCEDURE TRAY) ×3 IMPLANT
KIT TURNOVER KIT B (KITS) ×3 IMPLANT
MANIFOLD NEPTUNE II (INSTRUMENTS) ×3 IMPLANT
NEEDLE 22X1 1/2 (OR ONLY) (NEEDLE) IMPLANT
NS IRRIG 1000ML POUR BTL (IV SOLUTION) ×3 IMPLANT
PACK ARTHROSCOPY DSU (CUSTOM PROCEDURE TRAY) ×3 IMPLANT
PAD ABD 8X10 STRL (GAUZE/BANDAGES/DRESSINGS) ×2 IMPLANT
PAD ARMBOARD 7.5X6 YLW CONV (MISCELLANEOUS) ×6 IMPLANT
PENCIL BUTTON HOLSTER BLD 10FT (ELECTRODE) ×2 IMPLANT
SPONGE LAP 4X18 RFD (DISPOSABLE) ×3 IMPLANT
SUT ETHILON 3 0 PS 1 (SUTURE) ×3 IMPLANT
SUT VIC AB 0 CT1 27 (SUTURE) ×3
SUT VIC AB 0 CT1 27XBRD ANBCTR (SUTURE) IMPLANT
SUT VIC AB 2-0 CT1 27 (SUTURE) ×3
SUT VIC AB 2-0 CT1 TAPERPNT 27 (SUTURE) IMPLANT
SUT VIC AB 3-0 PS2 18 (SUTURE) ×3
SUT VIC AB 3-0 PS2 18XBRD (SUTURE) IMPLANT
SYR CONTROL 10ML LL (SYRINGE) IMPLANT
TAPE CLOTH SURG 6X10 WHT LF (GAUZE/BANDAGES/DRESSINGS) ×2 IMPLANT
TOWEL OR 17X24 6PK STRL BLUE (TOWEL DISPOSABLE) ×3 IMPLANT
TOWEL OR 17X26 10 PK STRL BLUE (TOWEL DISPOSABLE) ×3 IMPLANT
TUBE CONNECTING 12'X1/4 (SUCTIONS) ×1
TUBE CONNECTING 12X1/4 (SUCTIONS) ×2 IMPLANT
YANKAUER SUCT BULB TIP NO VENT (SUCTIONS) ×2 IMPLANT

## 2018-03-05 NOTE — Op Note (Signed)
NAMEGREYSEN, [name] MEDICAL RECORD YK:[id number] ACCOUNT [id number] DATE OF BIRTH:Nov 06, 1960 FACILITY: [facility] LOCATION: [place], [place]  OPERATIVE REPORT  DATE OF PROCEDURE:  03/05/2018  PREOPERATIVE DIAGNOSES: 1.  Right shoulder impingement. 2.  Right shoulder partial rotator cuff tear. 3.  Right shoulder painful distal clavicle nonunion.  POSTOPERATIVE DIAGNOSES:   1.  Right shoulder impingement. 2.  Right shoulder partial rotator cuff tear. 3.  Right shoulder painful distal clavicle nonunion.  PROCEDURE: 1.  Right shoulder arthroscopic acromioplasty. 2.  Right shoulder arthroscopic debridement. 3.  Right shoulder open distal clavicle excision.  ANESTHESIA:  General and block.  ATTENDING SURGEON:  [name], MD  ASSISTANT:  Loni Dolly, PA.  INDICATIONS:  The patient is a 57 year old man who fell many months ago and suffered a distal clavicle fracture.  Unfortunately, he has developed a painful nonunion.  This involves the distal 2 cm of his clavicle.  This has been evaluated by MRI.  He  also has symptoms of impingement and has been injected several times with no relief sustained.  He has pain which makes it difficult for him to use his arm and rest and he is offered operative intervention.  Informed operative consent was obtained after  discussion of possible complications including reaction to anesthesia, infection, continued pain.  SUMMARY OF FINDINGS:  Under general anesthesia and a block, a right shoulder arthroscopy was first performed.  This was notable for no degenerative changes and a normal biceps tendon.  The rotator cuff was partially torn on the bursal aspect and a  thorough debridement was done, but no tear where the repair was found.  I also debrided along the biceps tendon, but again no tear worthy of addressing this was found.  I debrided the subacromial bursa to expose the rotator cuff appropriately.  I also  exposed the distal  clavicle.  He had a fairly prominent subacromial morphology.  An acromioplasty was done with a bur.  I then performed the open resection of the distal 2 cm of the clavicle which basically was a nonunion.  He was closed primarily and  discharged home.  I did use fluoroscopy at the end of the case to confirm adequate resection of the nonunion and read this view myself.  Loni Dolly assisted throughout the case and was invaluable in that he helped retract while I performed the case.  He  also closed simultaneously to help minimize OR time.  DESCRIPTION OF PROCEDURE:  The patient was taken to the operating suite where general anesthetic was applied without difficulty.  He was also given a block in the preanesthesia area.  He was positioned in beach chair position and prepped and draped in  normal sterile fashion.  After the administration of preoperative IV Kefzol and appropriate time-out, an arthroscopy of the right shoulder was performed through a total of 2 portals.  Findings were as noted above procedure consists of the extensive  debridement followed by the acromioplasty with the bur in the lateral position followed by transfer of the bur to the posterior position.  The cuff was thoroughly examined and no tear worthy of repair was found.  Arthroscopic equipment was at this point  removed.  I then made an incision over the distal clavicle towards the [facility] joint.  Dissection was carried down to the clavicle and I made periosteal sleeves with the anterior and posterior, removing about 2 cm of the distal clavicle.  This was not united  with the rest of the  clavicle consistent with a nonunion seen on the MRI scan.  We then thoroughly irrigated and closed deep tissues with 0 Vicryl.  We used fluoroscopy to confirm adequate resection of the entire nonunion.  The clavicle also was quite  stable and did not sublux into a superior position as we maintained the coracoclavicular ligaments.  I then reapproximated deep  tissues with a #2 Vicryl in the subcutaneous tissues with a running subcuticular stitch.  We applied some Steri-Strips.  The  portals were loosely sutured with nylon.  Adaptic was placed for the various wounds followed by dry gauze and tape.  ESTIMATED BLOOD LOSS:  Obtained from anesthesia records.   FLUIDS:  Obtained from anesthesia records.  DISPOSITION:  The patient was extubated in the operating room and taken to recovery room in stable condition.  He was to be admitted for overnight observation with probable discharge home in the morning.  TN/NUANCE  D:03/05/2018 T:03/05/2018 JOB:[id number]

## 2018-03-05 NOTE — Anesthesia Procedure Notes (Signed)
Procedure Name: Intubation Date/Time: 03/05/2018 1:57 PM Performed by: Myna Bright, CRNA Pre-anesthesia Checklist: Patient identified, Emergency Drugs available, Suction available and Patient being monitored Patient Re-evaluated:Patient Re-evaluated prior to induction Oxygen Delivery Method: Circle system utilized Preoxygenation: Pre-oxygenation with 100% oxygen Induction Type: IV induction Ventilation: Mask ventilation without difficulty Laryngoscope Size: Mac and 4 Grade View: Grade I Tube type: Oral Tube size: 7.5 mm Number of attempts: 1 Airway Equipment and Method: Stylet Placement Confirmation: ETT inserted through vocal cords under direct vision,  positive ETCO2 and breath sounds checked- equal and bilateral Secured at: 24 cm Tube secured with: Tape Dental Injury: Teeth and Oropharynx as per pre-operative assessment

## 2018-03-05 NOTE — H&P (Signed)
[name] [name] is an 57 y.o. male.   Chief Complaint: Right shoulder pain HPI: [name] [name] is in again about his right shoulder.  He has difficulty using this arm especially when he tries to lift his disabled son.  He has trouble reaching up but can get his arm overhead.  He has been through an MRI scan of the shoulder since the last time he was in. He would like to get this shoulder fixed.   MRI:  I reviewed an MRI scan films and report of a study done at the [facility] on 01/24/18.  His biceps looks normal.  He has a nonunion of the old distal clavicle fracture.  His rotator cuff appears to be intact.  Past Medical History:  Diagnosis Date  . Adenomatous colon polyp 10/1999  . Anxiety   . Arthritis    neck, yoga helps.  . Cancer ([place])    skin  . Cataract   . Complex partial seizure ([place])   . Crohn's disease of small and large intestines ([place]) 1999  . Depression   . Diabetes mellitus    no meds at this time 10-24-17  . Esophageal stricture   . External hemorrhoids   . GERD (gastroesophageal reflux disease)   . History of kidney stones    3 large stones still present  . Hyperlipemia   . Hypertension   . Migraines   . MRSA (methicillin resistant Staphylococcus aureus) 10/2010   . Osteoporosis   . PONV (postoperative nausea and vomiting)   . PTSD (post-traumatic stress disorder)   . Renal cyst, acquired, left 07/08/2017  . Renal lesion 05/28/2017  . Seizures ([place])   . Skin cancer    squamous cell multiple; [facility]; followed every 3 months.  . Sleep apnea    CPAP machine, uses nightly  . Staphylococcus aureus bacteremia with sepsis ([place]) 05/28/2017    Past Surgical History:  Procedure Laterality Date  . CATARACT EXTRACTION Bilateral   . COLONOSCOPY    . ELBOW SURGERY  2012   elbow MRSA infection   . EYE SURGERY     cataracts removed, /w IOL  . INSERTION OF MESH N/A 07/01/2016   Procedure: INSERTION OF MESH;  Surgeon: [name], MD;  Location: [facility];  Service: General;   Laterality: N/A;  . IR URETERAL STENT LEFT NEW ACCESS W/O SEP NEPHROSTOMY CATH  02/02/2018  . NEPHROLITHOTOMY Left 02/02/2018   Procedure: NEPHROLITHOTOMY PERCUTANEOUS;  Surgeon: [name], MD;  Location: [facility];  Service: Urology;  Laterality: Left;  . SCALP LACERATION REPAIR Right 10/16/2017   From fall/staples  . SHOULDER SURGERY    . SINUS SURGERY WITH INSTATRAK    . TEE WITHOUT CARDIOVERSION N/A 05/09/2017   Procedure: TRANSESOPHAGEAL ECHOCARDIOGRAM (TEE);  Surgeon: [name], MD;  Location: [facility] ENDOSCOPY;  Service: Cardiovascular;  Laterality: N/A;  . TRANSTHORACIC ECHOCARDIOGRAM  02/22/2011   EF 55-65%; increased pattern of LVH with mild conc hypertrophy, abnormal relaxation & increased filling pressure (grade 2 diastolic dysfunction); atrial septum thickened (lipomatous hypertrophy)  . UMBILICAL HERNIA REPAIR N/A 07/01/2016   Procedure: UMBILICAL HERNIA REPAIR WITH MESH;  Surgeon: [name], MD;  Location: [facility];  Service: General;  Laterality: N/A;  . UPPER GASTROINTESTINAL ENDOSCOPY    . VASECTOMY      Family History  Problem Relation Age of Onset  . Colon polyps Father   . Stroke Father   . Hyperlipidemia Father   . Hypertension Father   . Heart disease Mother  CABG at age 39  . Hyperlipidemia Mother   . Hypertension Mother   . Stroke Paternal Grandmother   . Stroke Paternal Grandfather   . Other Child        tetrology of fallot (cornealia deland syndrome)  . Colon cancer Neg Hx   . Esophageal cancer Neg Hx   . Stomach cancer Neg Hx   . Rectal cancer Neg Hx    Social History:  reports that he has never smoked. He has never used smokeless tobacco. He reports that he does not drink alcohol or use drugs.  Allergies:  Allergies  Allergen Reactions  . Azithromycin Swelling    SWELLING REACTION UNSPECIFIED   . Beef-Derived Products Diarrhea    RED MEAT > UNSPECIFIED REACTION   . Milk-Related Compounds Diarrhea    DAIRY >UNSPECIFIED REACTION   .  Penicillins Hives    Rash as child- tolerates Ancef/CHILDHOOD ALLERGY Has patient had a PCN reaction causing immediate rash, facial/tongue/throat swelling, SOB or lightheadedness with hypotension: YES Has patient had a PCN reaction causing severe rash involving mucus membranes or skin necrosis: Unknown Has patient had a PCN reaction that required hospitalization: Unknown Has patient had a PCN reaction occurring within the last 10 years:Unknown If all of the above answers are "NO", then may proceed with Cephalosporin [place]  . Sulfonamide Derivatives Hives  . Dilaudid [Hydromorphone Hcl] Nausea And Vomiting    No medications prior to admission.    No results found for this or any previous visit (from the past 48 hour(s)). No results found.  Review of Systems  Musculoskeletal: Positive for joint pain.       Right shoulder   All other systems reviewed and are negative.   There were no vitals taken for this visit. Physical Exam  Constitutional: He is oriented to person, place, and time. He appears well-developed and well-nourished.  HENT:  Head: Normocephalic and atraumatic.  Eyes: Pupils are equal, round, and reactive to light.  Neck: Normal range of motion.  Cardiovascular: Normal rate and regular rhythm.  Respiratory: Effort normal.  GI: Soft.  Musculoskeletal:  Right shoulder motion is still a little bit limited.  He can forward flex to about 90 easily and to about 120 with some force.  External rotation is to 60 and internal rotation is to his side pocket.  He has impingement pain and a great deal of pain at the [facility] joint where there is also a bit of a prominence.  He has good cervical motion and no palpable lymphadenopathy.    Neurological: He is alert and oriented to person, place, and time.  Skin: Skin is warm and dry.  Psychiatric: He has a normal mood and affect. His behavior is normal. Judgment and thought content normal.    Assessment/Plan Assessment: Right distal  clavicle painful nonunion and impingement injected 09/17/17 with MRI 2019  Plan: [name] [name] is headed towards a shoulder operation at this point.  He has pain which is disabling to him and makes it difficult for him to lift his disabled son.  I think we can help him with a mixture of arthroscopic and open procedures.  At arthroscopy I will plan a relatively aggressive acromioplasty and then would perform an open distal clavicle excision.  I reviewed a week or two in a sling and inherent risks of anesthesia and infection related to the procedure.  [name] [name], Larwance Sachs, PA-C 03/05/2018, 6:54 AM

## 2018-03-05 NOTE — Op Note (Signed)
[name] [name] [id number] 03/05/2018   PRE-OP DIAGNOSIS: right sh impingement and painful distal clavicle nonunion  POST-OP DIAGNOSIS: same  PROCEDURE: right sh scope and open distal clavicle excision  ANESTHESIA: general and block  [name] G   Dictation #:  [id number]

## 2018-03-05 NOTE — Interval H&P Note (Signed)
History and Physical Interval Note:  03/05/2018 12:45 PM  [name] [name]  has presented today for surgery, with the diagnosis of RIGHT SHOULDER IMPINGEMENT AND AC PAIN  The various methods of treatment have been discussed with the patient and family. After consideration of risks, benefits and other options for treatment, the patient has consented to  Procedure(s): ARTHROSCOPY SHOULDER AND OPEN DISTAL CLAVICLE EXCISION (Right) as a surgical intervention .  The patient's history has been reviewed, patient examined, no change in status, stable for surgery.  I have reviewed the patient's chart and labs.  Questions were answered to the patient's satisfaction.     Alvetta Hidrogo G

## 2018-03-05 NOTE — Anesthesia Preprocedure Evaluation (Addendum)
Anesthesia Evaluation  Patient identified by MRN, date of birth, ID band Patient awake    Reviewed: Allergy & Precautions, NPO status , Patient's Chart, lab work & pertinent test results  History of Anesthesia Complications (+) PONV  Airway Mallampati: II  TM Distance: >3 FB Neck ROM: Full    Dental no notable dental hx. (+) Teeth Intact, Dental Advisory Given   Pulmonary sleep apnea ,    Pulmonary exam normal breath sounds clear to auscultation       Cardiovascular hypertension, + DOE  Normal cardiovascular exam Rhythm:Regular Rate:Normal  Echo 05/09/17 Left ventricle: The cavity size was normal. Wall thickness was   normal. Systolic function was normal. The estimated ejection   fraction was in the range of 60% to 65%. - Aortic valve: No evidence of vegetation. - Mitral valve: Mild prolapse, involving the posterior leaflet. No   evidence of vegetation. No evidence of vegetation. There was mild   regurgitation. - Left atrium: No evidence of thrombus in the appendage. - Right atrium: No evidence of thrombus in the atrial cavity or   appendage. - Atrial septum: There was a patent foramen ovale. Echo contrast   study showed a small right-to-left atrial level shunt, following   an increase in RA pressure induced by provocative maneuvers. This   should be of no clinical consequence.   Neuro/Psych  Headaches, Seizures -,     GI/Hepatic GERD  ,  Endo/Other  diabetes, Type 2  Renal/GU Renal hypertensionRenal disease     Musculoskeletal  (+) Arthritis ,   Abdominal (+) - obese,   Peds  Hematology  (+) anemia ,   Anesthesia Other Findings   Reproductive/Obstetrics                            Anesthesia Physical Anesthesia Plan  ASA: III  Anesthesia Plan: General   Post-op Pain Management:  Regional for Post-op pain   Induction: Intravenous  PONV Risk Score and Plan: 3 and Treatment  may vary due to age or medical condition, Scopolamine patch - Pre-op, Ondansetron and Dexamethasone  Airway Management Planned: Oral ETT  Additional Equipment:   Intra-op Plan:   Post-operative Plan: Extubation in OR  Informed Consent: I have reviewed the patients History and Physical, chart, labs and discussed the procedure including the risks, benefits and alternatives for the proposed anesthesia with the patient or authorized representative who has indicated his/her understanding and acceptance.   Dental advisory given  Plan Discussed with:   Anesthesia Plan Comments: (R interscalene block)        Anesthesia Quick Evaluation

## 2018-03-05 NOTE — Transfer of Care (Signed)
Immediate Anesthesia Transfer of Care Note  Patient: [name] [name]  Procedure(s) Performed: ARTHROSCOPY SHOULDER AND OPEN DISTAL CLAVICLE EXCISION (Right )  Patient Location: PACU  Anesthesia Type:General  Level of Consciousness: drowsy  Airway & Oxygen Therapy: Patient Spontanous Breathing and Patient connected to nasal cannula oxygen  Post-op Assessment: Report given to RN and Post -op Vital signs reviewed and stable  Post vital signs: Reviewed and stable  Last Vitals:  Vitals Value Taken Time  BP 147/89 03/05/2018  3:28 PM  Temp    Pulse 86 03/05/2018  3:28 PM  Resp 11 03/05/2018  3:28 PM  SpO2 90 % 03/05/2018  3:28 PM  Vitals shown include unvalidated device data.  Last Pain:  Vitals:   03/05/18 1155  TempSrc:   PainSc: 4          Complications: No apparent anesthesia complications

## 2018-03-05 NOTE — Anesthesia Procedure Notes (Signed)
Anesthesia Regional Block: Interscalene brachial plexus block   Pre-Anesthetic Checklist: ,, timeout performed, Correct Patient, Correct Site, Correct Laterality, Correct Procedure, Correct Position, site marked, Risks and benefits discussed, at surgeon's request and post-op pain management  Laterality: Upper and Right  Prep: Betadine, chloraprep, alcohol swabs       Needles:  Injection technique: Single-shot  Needle Type: Stimulator Needle - 40     Needle Length: 5cm  Needle Gauge: 22     Additional Needles:   Procedures:, nerve stimulator,,,,,,,   Nerve Stimulator or Paresthesia:  Response: Twitch elicited, 0.5 mA, 0.3 ms,   Additional Responses:   Narrative:  Start time: 03/05/2018 12:35 PM End time: 03/05/2018 12:49 PM  Performed by: Personally  Anesthesiologist: Rica Koyanagi, MD  Additional Notes: Block assessed prior to start of surgery

## 2018-03-05 NOTE — Anesthesia Postprocedure Evaluation (Signed)
Anesthesia Post Note  Patient: [name] [name]  Procedure(s) Performed: ARTHROSCOPY SHOULDER AND OPEN DISTAL CLAVICLE EXCISION (Right )     Patient location during evaluation: PACU Anesthesia Type: General Level of consciousness: awake and alert and oriented Pain management: pain level controlled Vital Signs Assessment: post-procedure vital signs reviewed and stable Respiratory status: spontaneous breathing, nonlabored ventilation and respiratory function stable Cardiovascular status: blood pressure returned to baseline and stable Postop Assessment: no apparent nausea or vomiting Anesthetic complications: no    Last Vitals:  Vitals:   03/05/18 1551 03/05/18 1559  BP:  (!) 151/86  Pulse: 89 85  Resp: 13 10  Temp:    SpO2: 95% 96%    Last Pain:  Vitals:   03/05/18 1559  TempSrc:   PainSc: Asleep                 Callista Hoh A.

## 2018-03-06 ENCOUNTER — Encounter (HOSPITAL_COMMUNITY): Payer: Self-pay | Admitting: Orthopaedic Surgery

## 2018-03-06 DIAGNOSIS — M75111 Incomplete rotator cuff tear or rupture of right shoulder, not specified as traumatic: Secondary | ICD-10-CM | POA: Diagnosis not present

## 2018-03-06 MED ORDER — OXYCODONE-ACETAMINOPHEN 5-325 MG PO TABS: 1.0000 | ORAL_TABLET | Freq: Four times a day (QID) | ORAL | 0 refills | Status: DC | PRN

## 2018-03-06 NOTE — Discharge Summary (Signed)
Patient ID: [name] [name] MRN: [id number] DOB/AGE: 1960-12-14 57 y.o.  Admit date: 03/05/2018 Discharge date: 03/06/2018  Admission Diagnoses:  Principal Problem:   S/P shoulder surgery   Discharge Diagnoses:  Same  Past Medical History:  Diagnosis Date  . Adenomatous colon polyp 10/1999  . Anxiety   . Arthritis    neck, yoga helps.  . Cancer ([place])    skin  . Cataract   . Complex partial seizure ([place])   . Crohn's disease of small and large intestines ([place]) 1999  . Depression   . Diabetes mellitus    no meds at this time 10-24-17  . Esophageal stricture   . External hemorrhoids   . GERD (gastroesophageal reflux disease)   . History of kidney stones    3 large stones still present  . Hyperlipemia   . Hypertension   . Migraines   . MRSA (methicillin resistant Staphylococcus aureus) 10/2010   . Osteoporosis   . PONV (postoperative nausea and vomiting)   . PTSD (post-traumatic stress disorder)   . Renal cyst, acquired, left 07/08/2017  . Renal lesion 05/28/2017  . Seizures ([place])   . Skin cancer    squamous cell multiple; [facility]; followed every 3 months.  . Sleep apnea    CPAP machine, uses nightly  . Staphylococcus aureus bacteremia with sepsis ([place]) 05/28/2017    Surgeries: Procedure(s): ARTHROSCOPY SHOULDER AND OPEN DISTAL CLAVICLE EXCISION on 03/05/2018   Consultants:   Discharged Condition: Improved  Hospital Course: [name] [name] is an 57 y.o. male who was admitted 03/05/2018 for operative treatment ofS/P shoulder surgery. Patient has severe unremitting pain that affects sleep, daily activities, and work/hobbies. After pre-op clearance the patient was taken to the operating room on 03/05/2018 and underwent  Procedure(s): ARTHROSCOPY SHOULDER AND OPEN DISTAL CLAVICLE EXCISION.    Patient was given perioperative antibiotics:  Anti-infectives (From admission, onward)   Start     Dose/Rate Route Frequency Ordered Stop   03/05/18 2200  rifaximin (XIFAXAN) tablet 550  mg     550 mg Oral 2 times daily 03/05/18 1728     03/05/18 1345  vancomycin (VANCOCIN) IVPB 1000 mg/200 mL premix     1,000 mg 200 mL/hr over 60 Minutes Intravenous To ShortStay Surgical 03/04/18 0939 03/05/18 1320       Patient was given sequential compression devices, early ambulation, and chemoprophylaxis to prevent DVT.  Patient benefited maximally from hospital stay and there were no complications.    Recent vital signs:  Patient Vitals for the past 24 hrs:  BP Temp Temp src Pulse Resp SpO2 Height Weight  03/06/18 0500 124/74 98.2 F (36.8 C) Oral 98 16 99 % - -  03/06/18 0039 128/66 98.3 F (36.8 C) Oral (!) 102 18 94 % - -  03/05/18 2040 135/77 98 F (36.7 C) Oral (!) 104 18 97 % - -  03/05/18 1656 137/82 98 F (36.7 C) Oral 87 14 93 % - -  03/05/18 1559 (!) 151/86 - - 85 10 96 % - -  03/05/18 1551 - - - 89 13 95 % - -  03/05/18 1530 (!) 147/89 98 F (36.7 C) - 85 11 91 % - -  03/05/18 1255 139/86 - - 92 14 96 % - -  03/05/18 1250 135/86 - - 96 (!) 23 96 % - -  03/05/18 1245 (!) 142/86 - - 95 (!) 21 96 % - -  03/05/18 1239 (!) 148/85 - - 94 (!) 6 98 % - -  03/05/18 1140 138/73 - - - - - - -  03/05/18 1138 (!) 143/82 98.2 F (36.8 C) Oral 99 18 98 % 5' 11"  (1.803 m) 90.7 kg     Recent laboratory studies:  Recent Labs    03/05/18 1211  WBC 3.4*  HGB 11.6*  HCT 33.2*  PLT 74*  NA 143  K 3.5  CL 114*  CO2 23  BUN 9  CREATININE 0.91  GLUCOSE 103*  CALCIUM 8.6*     Discharge Medications:   Allergies as of 03/06/2018      Reactions   Azithromycin Swelling   SWELLING REACTION UNSPECIFIED    Beef-derived Products Diarrhea   RED MEAT > UNSPECIFIED REACTION    Milk-related Compounds Diarrhea   DAIRY >UNSPECIFIED REACTION    Penicillins Hives   Rash as child- tolerates Ancef/CHILDHOOD ALLERGY Has patient had a PCN reaction causing immediate rash, facial/tongue/throat swelling, SOB or lightheadedness with hypotension: YES Has patient had a PCN reaction  causing severe rash involving mucus membranes or skin necrosis: Unknown Has patient had a PCN reaction that required hospitalization: Unknown Has patient had a PCN reaction occurring within the last 10 years:Unknown If all of the above answers are "NO", then may proceed with Cephalosporin [place]   Sulfonamide Derivatives Hives   Dilaudid [hydromorphone Hcl] Nausea And Vomiting      Medication List    STOP taking these medications   oxyCODONE-acetaminophen 10-325 MG tablet Commonly known as:  PERCOCET Replaced by:  oxyCODONE-acetaminophen 5-325 MG tablet     TAKE these medications   ADVIL PM 200-25 MG Caps Generic drug:  Ibuprofen-diphenhydrAMINE HCl Take 1 tablet by mouth at bedtime as needed (sleep).   aspirin EC 81 MG tablet Take 81 mg at bedtime by mouth.   CALCIUM 600+D PO Take 1 tablet by mouth 2 (two) times daily.   cholecalciferol 1000 units tablet Commonly known as:  VITAMIN D Take 1,000 Units at bedtime by mouth.   ciprofloxacin 500 MG tablet Commonly known as:  CIPRO Take 1 tablet (500 mg total) by mouth 2 (two) times daily.   DAIRY-RELIEF PO Take 2 tablets by mouth 2 (two) times daily.   esomeprazole 40 MG capsule Commonly known as:  NEXIUM TAKE 1 CAPSULE TWICE A DAY.MAKE AN APPOINTMENT FOR    FURTHER REFILLS What changed:  See the new instructions.   esomeprazole 40 MG capsule Commonly known as:  NEXIUM TAKE 1 CAPSULE TWICE A DAY.MAKE AN APPOINTMENT FOR    FURTHER REFILLS What changed:  Another medication with the same name was changed. Make sure you understand how and when to take each.   Eszopiclone 3 MG Tabs Take 1 tablet (3 mg total) by mouth at bedtime. Take immediately before bedtime   ferrous sulfate 325 (65 FE) MG tablet Take 325 mg at bedtime by mouth.   folic acid 1 MG tablet Commonly known as:  FOLVITE Take 2 tablets (2 mg total) by mouth daily.   ONE TOUCH ULTRA TEST test strip Generic drug:  glucose blood USE AS INSTRUCTED   glucose  blood test strip Use as instructed   lamoTRIgine 150 MG tablet Commonly known as:  LAMICTAL Take 1 tablet (150 mg total) by mouth daily. What changed:  when to take this   mercaptopurine 50 MG tablet Commonly known as:  PURINETHOL FOR DIRECTIONS ON HOW TO   TAKE THIS MEDICINE, READ   THE ENCLOSED MEDICATION    INFORMATION FORM What changed:    how much to  take  how to take this  when to take this  additional instructions   mesalamine 1.2 g EC tablet Commonly known as:  LIALDA TAKE 2 TABLETS DAILY (2.4 GTOTAL) What changed:  See the new instructions.   methscopolamine 5 MG tablet Commonly known as:  PAMINE FORTE Take 1 tablet (5 mg total) by mouth 2 (two) times daily.   mirtazapine 30 MG tablet Commonly known as:  REMERON Take 30 mg by mouth at bedtime.   oxyCODONE-acetaminophen 5-325 MG tablet Commonly known as:  PERCOCET/ROXICET Take 1-2 tablets by mouth every 6 (six) hours as needed for severe pain. Replaces:  oxyCODONE-acetaminophen 10-325 MG tablet   phenazopyridine 200 MG tablet Commonly known as:  PYRIDIUM Take 1 tablet (200 mg total) by mouth 3 (three) times daily as needed for pain.   potassium chloride SA 20 MEQ tablet Commonly known as:  K-DUR,KLOR-CON Take 1 tablet (20 mEq total) by mouth 2 (two) times daily.   PROBIOTIC PO Take 1 tablet at bedtime by mouth.   rifaximin 550 MG Tabs tablet Commonly known as:  XIFAXAN Take 1 tablet (550 mg total) by mouth 2 (two) times daily.   sertraline 100 MG tablet Commonly known as:  ZOLOFT Take 200 mg daily with breakfast by mouth.   vitamin B-12 500 MCG tablet Commonly known as:  CYANOCOBALAMIN Take 500 mcg by mouth at bedtime.   vitamin C 1000 MG tablet Take 1,000 mg by mouth at bedtime.       Diagnostic Studies: No results found.  Disposition: Discharge disposition: 01-Home or Self Care       Discharge Instructions    Call MD / Call 911   Complete by:  As directed    If you experience  chest pain or shortness of breath, CALL 911 and be transported to the hospital emergency room.  If you develope a fever above 101 F, pus (white drainage) or increased drainage or redness at the wound, or calf pain, call your surgeon's office.   Constipation Prevention   Complete by:  As directed    Drink plenty of fluids.  Prune juice may be helpful.  You may use a stool softener, such as Colace (over the counter) 100 mg twice a day.  Use MiraLax (over the counter) for constipation as needed.   Diet - low sodium heart healthy   Complete by:  As directed    Discharge instructions   Complete by:  As directed    Keep bandage clean and dry until follow up. Wear sling until follow up. Use percocet for pain. Follow up in office next Wednesday or Thursday.   Increase activity slowly as tolerated   Complete by:  As directed       Follow-up Information    [name], MD. Schedule an appointment as soon as possible for a visit in 1 week.   Specialty:  Orthopedic Surgery Contact information: [place]  [phone]            Signed: Rich Fuchs 03/06/2018, 7:42 AM

## 2018-03-06 NOTE — Progress Notes (Signed)
Subjective: 1 Day Post-Op Procedure(s) (LRB): ARTHROSCOPY SHOULDER AND OPEN DISTAL CLAVICLE EXCISION (Right)   Patient doing well and wishes to go home this morning.   Activity level:  Wear sling until follow up Diet tolerance:  ok Voiding:  ok Patient reports pain as mild.    Objective: Vital signs in last 24 hours: Temp:  [98 F (36.7 C)-98.3 F (36.8 C)] 98.2 F (36.8 C) (09/13 0500) Pulse Rate:  [85-104] 98 (09/13 0500) Resp:  [6-23] 16 (09/13 0500) BP: (124-151)/(66-89) 124/74 (09/13 0500) SpO2:  [91 %-99 %] 99 % (09/13 0500) Weight:  [90.7 kg] 90.7 kg (09/12 1138)  Labs: Recent Labs    03/05/18 1211  HGB 11.6*   Recent Labs    03/05/18 1211  WBC 3.4*  RBC 3.05*  HCT 33.2*  PLT 74*   Recent Labs    03/05/18 1211  NA 143  K 3.5  CL 114*  CO2 23  BUN 9  CREATININE 0.91  GLUCOSE 103*  CALCIUM 8.6*   No results for input(s): LABPT, INR in the last 72 hours.  Physical Exam:  Neurologically intact ABD soft Neurovascular intact Sensation intact distally Intact pulses distally Incision: dressing C/D/I and no drainage No cellulitis present  Assessment/Plan:  1 Day Post-Op Procedure(s) (LRB): ARTHROSCOPY SHOULDER AND OPEN DISTAL CLAVICLE EXCISION (Right) Patient is doing well this morning so we will discharge him home. He will keep his bandage clean and dry until follow up.  I will see him in office next wed or Thursday. I have written a Rx for percocet for pain.  Lola Czerwonka, Larwance Sachs 03/06/2018, 7:38 AM

## 2018-03-09 ENCOUNTER — Telehealth: Payer: Self-pay | Admitting: Family Medicine

## 2018-03-09 NOTE — Telephone Encounter (Signed)
Left a VM in regards to his appt with Dr. Pamella Pert on 04/07/2018 at 2:20. The templates changed and we just needed to move his appt to 2:00 or 2:40 on that day.

## 2018-03-12 DIAGNOSIS — M25611 Stiffness of right shoulder, not elsewhere classified: Secondary | ICD-10-CM | POA: Diagnosis not present

## 2018-03-17 ENCOUNTER — Ambulatory Visit: Payer: 59 | Admitting: Nurse Practitioner

## 2018-03-18 DIAGNOSIS — M25611 Stiffness of right shoulder, not elsewhere classified: Secondary | ICD-10-CM | POA: Diagnosis not present

## 2018-03-19 DIAGNOSIS — G4733 Obstructive sleep apnea (adult) (pediatric): Secondary | ICD-10-CM | POA: Diagnosis not present

## 2018-03-23 DIAGNOSIS — M25611 Stiffness of right shoulder, not elsewhere classified: Secondary | ICD-10-CM | POA: Diagnosis not present

## 2018-03-23 DIAGNOSIS — Z23 Encounter for immunization: Secondary | ICD-10-CM | POA: Diagnosis not present

## 2018-04-01 DIAGNOSIS — M25611 Stiffness of right shoulder, not elsewhere classified: Secondary | ICD-10-CM | POA: Diagnosis not present

## 2018-04-03 DIAGNOSIS — G4733 Obstructive sleep apnea (adult) (pediatric): Secondary | ICD-10-CM | POA: Diagnosis not present

## 2018-04-07 ENCOUNTER — Ambulatory Visit (INDEPENDENT_AMBULATORY_CARE_PROVIDER_SITE_OTHER): Payer: 59 | Admitting: Family Medicine

## 2018-04-07 ENCOUNTER — Encounter: Payer: Self-pay | Admitting: Family Medicine

## 2018-04-07 VITALS — BP 134/82 | HR 78 | Temp 98.4°F | Resp 17 | Ht 71.0 in | Wt 211.0 lb

## 2018-04-07 DIAGNOSIS — K7469 Other cirrhosis of liver: Secondary | ICD-10-CM | POA: Diagnosis not present

## 2018-04-07 DIAGNOSIS — I10 Essential (primary) hypertension: Secondary | ICD-10-CM

## 2018-04-07 DIAGNOSIS — K5080 Crohn's disease of both small and large intestine without complications: Secondary | ICD-10-CM | POA: Diagnosis not present

## 2018-04-07 DIAGNOSIS — R296 Repeated falls: Secondary | ICD-10-CM | POA: Diagnosis not present

## 2018-04-07 MED ORDER — MUPIROCIN 2 % EX OINT: 1.0000 "application " | TOPICAL_OINTMENT | Freq: Three times a day (TID) | CUTANEOUS | 1 refills | Status: DC

## 2018-04-07 MED ORDER — ELETRIPTAN HYDROBROMIDE 20 MG PO TABS: 20.0000 mg | ORAL_TABLET | ORAL | 0 refills | Status: DC | PRN

## 2018-04-07 NOTE — Patient Instructions (Signed)
° ° ° °  If you have lab work done today you will be contacted with your lab results within the next 2 weeks.  If you have not heard from us then please contact us. The fastest way to get your results is to register for My Chart. ° ° °IF you received an x-ray today, you will receive an invoice from [facility]. Please contact [facility] at [phone] with questions or concerns regarding your invoice.  ° °IF you received labwork today, you will receive an invoice from [facility]. Please contact [facility] at [phone] with questions or concerns regarding your invoice.  ° °Our billing staff will not be able to assist you with questions regarding bills from these companies. ° °You will be contacted with the lab results as soon as they are available. The fastest way to get your results is to activate your My Chart account. Instructions are located on the last page of this paperwork. If you have not heard from us regarding the results in 2 weeks, please contact this office. °  ° ° ° °

## 2018-04-07 NOTE — Progress Notes (Signed)
10/15/20192:58 PM  [name] [name] 1960/09/01, 57 y.o. male [id number]  Chief Complaint  Patient presents with  . crohn's  . Anxiety  . Hypertension    HPI:   Patient is a 57 y.o. male with past medical history significant for DM2, HTN, chrons disease with cirrhosis, anxiety, depression, seizures who presents today to establish care  Previous pcp Dr Tamala Julian Last visit July 2019 Had been having dizziness, recurrent falls, resulted in fracture Stopped BP meds Has had removal of kidney stones and shoulder surgery since last visit Going to PT for his shoulder After kidney stone surgery, has not had anymore back pain Unsure dizziness and fogginess might be related to seizures vs ammonia Has problems with short term memory, does not remember conversations, he gets disoriented, difficulty with following instructions, completing tasks  Now on disability Walks 4-6 miles a day  Psych started trazodone today Denies any SI Goes to counseling regularly  bp at home within normal range  Requesting refill of relpax for infrequent migraines  Patient Care Team: Rutherford Guys, MD as PCP - General (Family Medicine) Kennith Center, RD as Dietitian (Family Medicine) Wilfred Curtis, [facility] as Counselor (Psychology) Corena Pilgrim, MD as Consulting Physician (Psychiatry) [name], MD as Consulting Physician (Urology)  Dr Larey Seat, Neurology - sleep medicine Dr Krista Blue - neurology migraines and seizures Dr Fuller Plan - GI   Lab Results  Component Value Date   HGBA1C 4.0 (L) 01/26/2018   HGBA1C 4.7 10/27/2017   HGBA1C 4.6 05/23/2017   Lab Results  Component Value Date   MICROALBUR 7.4 05/15/2016   [place] 64 10/27/2017   CREATININE 0.91 03/05/2018    Fall Risk  04/07/2018 02/18/2018 01/05/2018 12/16/2017 10/27/2017  Falls in the past year? No Yes Yes No Yes  Number falls in past yr: - 1 1 - 2 or more  Injury with Fall? - Yes No - Yes  Comment - - - - -  Risk Factor Category  -  High Fall Risk - - High Fall Risk  Risk for fall due to : - - - - -  Follow up - Education provided - - -     Depression screen [facility] 2/9 04/07/2018 01/05/2018 12/16/2017  Decreased Interest 0 0 0  Down, Depressed, Hopeless 0 0 0  PHQ - 2 Score 0 0 0  Altered sleeping - - -  Tired, decreased energy - - -  Change in appetite - - -  Feeling bad or failure about yourself  - - -  Trouble concentrating - - -  Moving slowly or fidgety/restless - - -  Suicidal thoughts - - -  PHQ-9 Score - - -  Difficult doing work/chores - - -  Some recent data might be hidden    Allergies  Allergen Reactions  . Azithromycin Swelling    SWELLING REACTION UNSPECIFIED   . Beef-Derived Products Diarrhea    RED MEAT > UNSPECIFIED REACTION   . Milk-Related Compounds Diarrhea    DAIRY >UNSPECIFIED REACTION   . Penicillins Hives    Rash as child- tolerates Ancef/CHILDHOOD ALLERGY Has patient had a PCN reaction causing immediate rash, facial/tongue/throat swelling, SOB or lightheadedness with hypotension: YES Has patient had a PCN reaction causing severe rash involving mucus membranes or skin necrosis: Unknown Has patient had a PCN reaction that required hospitalization: Unknown Has patient had a PCN reaction occurring within the last 10 years:Unknown If all of the above answers are "NO", then may proceed with Cephalosporin  [place]  . Sulfonamide Derivatives Hives  . Dilaudid [Hydromorphone Hcl] Nausea And Vomiting    Prior to Admission medications   Medication Sig Start Date End Date Taking? Authorizing Provider  Ascorbic Acid (VITAMIN C) 1000 MG tablet Take 1,000 mg by mouth at bedtime.   Yes [provider]  aspirin EC 81 MG tablet Take 81 mg at bedtime by mouth.    Yes [provider]  Calcium Carbonate-Vitamin D (CALCIUM 600+D PO) Take 1 tablet by mouth 2 (two) times daily.   Yes [provider]  cholecalciferol (VITAMIN D) 1000 units tablet Take 1,000 Units at bedtime by mouth.     Yes [provider]  esomeprazole (NEXIUM) 40 MG capsule TAKE 1 CAPSULE TWICE A DAY.MAKE AN APPOINTMENT FOR    FURTHER REFILLS 01/30/18  Yes Ladene Artist, MD  Eszopiclone 3 MG TABS Take 1 tablet (3 mg total) by mouth at bedtime. Take immediately before bedtime 03/21/17  Yes Wardell Honour, MD  ferrous sulfate 325 (65 FE) MG tablet Take 325 mg at bedtime by mouth.    Yes [provider]  folic acid (FOLVITE) 1 MG tablet Take 2 tablets (2 mg total) by mouth daily. 10/27/17  Yes Wardell Honour, MD  glucose blood (CHOICE DM FORA G20 TEST STRIPS) test strip Use as instructed 11/13/12  Yes [name], MD  Ibuprofen-diphenhydrAMINE HCl (ADVIL PM) 200-25 MG CAPS Take 1 tablet by mouth at bedtime as needed (sleep).   Yes [provider]  Lactase (DAIRY-RELIEF PO) Take 2 tablets by mouth 2 (two) times daily.    Yes [provider]  lamoTRIgine (LAMICTAL) 150 MG tablet Take 1 tablet (150 mg total) by mouth daily. Patient taking differently: Take 150 mg by mouth 2 (two) times daily.  10/09/17  Yes Dennie Bible, NP  mercaptopurine (PURINETHOL) 50 MG tablet FOR DIRECTIONS ON HOW TO   TAKE THIS MEDICINE, READ   THE ENCLOSED MEDICATION    INFORMATION FORM Patient taking differently: Take 100 mg by mouth daily.  08/27/17  Yes Ladene Artist, MD  mesalamine (LIALDA) 1.2 g EC tablet TAKE 2 TABLETS DAILY (2.4 GTOTAL) Patient taking differently: Take 2.4 g by mouth daily with breakfast.  01/30/18  Yes Ladene Artist, MD  methscopolamine (PAMINE FORTE) 5 MG tablet Take 1 tablet (5 mg total) by mouth 2 (two) times daily. 07/21/17  Yes Ladene Artist, MD  ONE TOUCH ULTRA TEST test strip USE AS INSTRUCTED   Yes Weber, Sarah L, PA-C  potassium chloride SA (K-DUR,KLOR-CON) 20 MEQ tablet Take 1 tablet (20 mEq total) by mouth 2 (two) times daily. 10/27/17  Yes Wardell Honour, MD  Probiotic Product (PROBIOTIC PO) Take 1 tablet at bedtime by mouth.    Yes [provider]  rifaximin (XIFAXAN) 550 MG TABS tablet Take 1 tablet (550 mg total) by mouth 2 (two) times daily. 08/27/17  Yes Ladene Artist, MD  sertraline (ZOLOFT) 100 MG tablet Take 200 mg daily with breakfast by mouth.  03/16/17  Yes [provider]  vitamin B-12 (CYANOCOBALAMIN) 500 MCG tablet Take 500 mcg by mouth at bedtime.   Yes [provider]    Past Medical History:  Diagnosis Date  . Adenomatous colon polyp 10/1999  . Anxiety   . Arthritis    neck, yoga helps.  . Cancer ([place])    skin  . Cataract   . Complex partial seizure ([place])   . Crohn's disease of small  and large intestines ([place]) 1999  . Depression   . Diabetes mellitus    no meds at this time 10-24-17  . Esophageal stricture   . External hemorrhoids   . GERD (gastroesophageal reflux disease)   . History of kidney stones    3 large stones still present  . Hyperlipemia   . Hypertension   . Migraines   . MRSA (methicillin resistant Staphylococcus aureus) 10/2010   . Osteoporosis   . PONV (postoperative nausea and vomiting)   . PTSD (post-traumatic stress disorder)   . Renal cyst, acquired, left 07/08/2017  . Renal lesion 05/28/2017  . Seizures ([place])   . Skin cancer    squamous cell multiple; [facility]; followed every 3 months.  . Sleep apnea    CPAP machine, uses nightly  . Staphylococcus aureus bacteremia with sepsis ([place]) 05/28/2017    Past Surgical History:  Procedure Laterality Date  . CATARACT EXTRACTION Bilateral   . COLONOSCOPY    . ELBOW SURGERY  2012   elbow MRSA infection   . EYE SURGERY     cataracts removed, /w IOL  . INSERTION OF MESH N/A 07/01/2016   Procedure: INSERTION OF MESH;  Surgeon: [name], MD;  Location: [facility];  Service: General;  Laterality: N/A;  . IR URETERAL STENT LEFT NEW ACCESS W/O SEP NEPHROSTOMY CATH  02/02/2018  . NEPHROLITHOTOMY Left 02/02/2018   Procedure: NEPHROLITHOTOMY PERCUTANEOUS;  Surgeon: [name], MD;  Location: [facility];  Service: Urology;   Laterality: Left;  . SCALP LACERATION REPAIR Right 10/16/2017   From fall/staples  . SHOULDER ARTHROSCOPY Right 03/05/2018   Procedure: ARTHROSCOPY SHOULDER AND OPEN DISTAL CLAVICLE EXCISION;  Surgeon: [name], MD;  Location: [facility];  Service: Orthopedics;  Laterality: Right;  . SHOULDER SURGERY    . SINUS SURGERY WITH INSTATRAK    . TEE WITHOUT CARDIOVERSION N/A 05/09/2017   Procedure: TRANSESOPHAGEAL ECHOCARDIOGRAM (TEE);  Surgeon: [name], MD;  [facility];  Service: Cardiovascular;  Laterality: N/A;  . TRANSTHORACIC ECHOCARDIOGRAM  02/22/2011   EF 55-65%; increased pattern of LVH with mild conc hypertrophy, abnormal relaxation & increased filling pressure (grade 2 diastolic dysfunction); atrial septum thickened (lipomatous hypertrophy)  . UMBILICAL HERNIA REPAIR N/A 07/01/2016   Procedure: UMBILICAL HERNIA REPAIR WITH MESH;  Surgeon: [name], MD;  Location: [facility];  Service: General;  Laterality: N/A;  . UPPER GASTROINTESTINAL ENDOSCOPY    . VASECTOMY      Social History   Tobacco Use  . Smoking status: Never Smoker  . Smokeless tobacco: Never Used  Substance Use Topics  . Alcohol use: No    Family History  Problem Relation Age of Onset  . Colon polyps Father   . Stroke Father   . Hyperlipidemia Father   . Hypertension Father   . Heart disease Mother        CABG at age 34  . Hyperlipidemia Mother   . Hypertension Mother   . Stroke Paternal Grandmother   . Stroke Paternal Grandfather   . Other Child        tetrology of fallot (cornealia deland syndrome)  . Colon cancer Neg Hx   . Esophageal cancer Neg Hx   . Stomach cancer Neg Hx   . Rectal cancer Neg Hx     ROS Per hpi  OBJECTIVE:  Blood pressure 134/82, pulse 78, temperature 98.4 F (36.9 C), temperature source Oral, resp. rate 17, height 5' 11"  (1.803 m), weight 211 lb (95.7 kg), SpO2 98 %.  Body mass index is 29.43 kg/m.   Wt Readings from Last 3 Encounters:  04/07/18 211 lb  (95.7 kg)  03/05/18 200 lb (90.7 kg)  02/18/18 210 lb (95.3 kg)   BP Readings from Last 3 Encounters:  04/07/18 134/82  03/06/18 124/74  02/18/18 (!) 144/88    Physical Exam  Constitutional: He is oriented to person, place, and time. He appears well-developed and well-nourished.  HENT:  Head: Normocephalic and atraumatic.  Mouth/Throat: Oropharynx is clear and moist.  Eyes: Pupils are equal, round, and reactive to light. Conjunctivae and EOM are normal.  Neck: Neck supple.  Cardiovascular: Normal rate and regular rhythm. Exam reveals no gallop and no friction rub.  No murmur heard. Pulmonary/Chest: Effort normal and breath sounds normal. He has no wheezes. He has no rales.  Musculoskeletal: He exhibits no edema.  Neurological: He is alert and oriented to person, place, and time.  Skin: Skin is warm and dry.  Psychiatric: He has a normal mood and affect.  Nursing note and vitals reviewed.    ASSESSMENT and PLAN  1. Essential hypertension Controlled. Continue current regime.  - Care order/instruction:  2. Crohn's disease of both small and large intestine without complication ([place]) 3. Other cirrhosis of liver (HCC) Stable. Managed by GI  4. Frequent falls Unclear etiology, seems to have been in setting of recurrent UTIs related to kidney stones. Continue to monitor. Consider PT. Consider alternative to walking long distances  Other orders - mupirocin ointment (BACTROBAN) 2 %; Apply 1 application topically 3 (three) times daily. - eletriptan (RELPAX) 20 MG tablet; Take 1 tablet (20 mg total) by mouth as needed for migraine or headache. May repeat in 2 hours if headache persists or recurs.  Return in about 3 months (around 07/08/2018) for chronic medical conditions.    Rutherford Guys, MD [facility] at [place], [place] [address] Ph.  [phone] Fax [phone]

## 2018-04-08 DIAGNOSIS — M25611 Stiffness of right shoulder, not elsewhere classified: Secondary | ICD-10-CM | POA: Diagnosis not present

## 2018-04-14 ENCOUNTER — Ambulatory Visit: Payer: 59 | Admitting: Nurse Practitioner

## 2018-04-14 DIAGNOSIS — M25611 Stiffness of right shoulder, not elsewhere classified: Secondary | ICD-10-CM | POA: Diagnosis not present

## 2018-04-20 ENCOUNTER — Other Ambulatory Visit: Payer: Self-pay | Admitting: Gastroenterology

## 2018-04-21 DIAGNOSIS — M25611 Stiffness of right shoulder, not elsewhere classified: Secondary | ICD-10-CM | POA: Diagnosis not present

## 2018-04-23 DIAGNOSIS — M25611 Stiffness of right shoulder, not elsewhere classified: Secondary | ICD-10-CM | POA: Diagnosis not present

## 2018-04-24 ENCOUNTER — Encounter (HOSPITAL_COMMUNITY): Payer: Self-pay

## 2018-04-24 ENCOUNTER — Ambulatory Visit (HOSPITAL_COMMUNITY)
Admission: EM | Admit: 2018-04-24 | Discharge: 2018-04-24 | Disposition: A | Discharge status: Home / Self Care | Payer: 59 | Attending: Family Medicine | Admitting: Family Medicine

## 2018-04-24 DIAGNOSIS — Z87440 Personal history of urinary (tract) infections: Secondary | ICD-10-CM | POA: Insufficient documentation

## 2018-04-24 DIAGNOSIS — Z79899 Other long term (current) drug therapy: Secondary | ICD-10-CM | POA: Insufficient documentation

## 2018-04-24 DIAGNOSIS — Z87442 Personal history of urinary calculi: Secondary | ICD-10-CM | POA: Diagnosis not present

## 2018-04-24 DIAGNOSIS — N390 Urinary tract infection, site not specified: Secondary | ICD-10-CM | POA: Insufficient documentation

## 2018-04-24 DIAGNOSIS — R300 Dysuria: Secondary | ICD-10-CM | POA: Diagnosis present

## 2018-04-24 DIAGNOSIS — Z7982 Long term (current) use of aspirin: Secondary | ICD-10-CM | POA: Diagnosis not present

## 2018-04-24 LAB — POCT URINALYSIS DIP (DEVICE)
Bilirubin Urine: NEGATIVE
GLUCOSE, UA: NEGATIVE mg/dL
KETONES UR: NEGATIVE mg/dL
Leukocytes, UA: NEGATIVE
Nitrite: NEGATIVE
PROTEIN: NEGATIVE mg/dL
SPECIFIC GRAVITY, URINE: 1.025 (ref 1.005–1.030)
Urobilinogen, UA: 1 mg/dL (ref 0.0–1.0)
pH: 6.5 (ref 5.0–8.0)

## 2018-04-24 MED ORDER — CIPROFLOXACIN HCL 500 MG PO TABS: 500.0000 mg | ORAL_TABLET | Freq: Two times a day (BID) | ORAL | 0 refills | Status: DC

## 2018-04-24 NOTE — ED Triage Notes (Signed)
Pt states that he has been having cloudy and bloody urine X1 week with frequent urination and burning.

## 2018-04-24 NOTE — ED Notes (Signed)
Bed: UC01 Expected date: 04/24/18 Expected time:  Means of arrival:  Comments: For APPTS

## 2018-04-24 NOTE — ED Provider Notes (Signed)
[place]    CSN: [id number] Arrival date & time: 04/24/18  1316     History   Chief Complaint Chief Complaint  Patient presents with  . Urinary Tract Infection    HPI [name] SMOKER is a 57 y.o. male.   HPI  Patient is here for symptoms of urinary tract infection.  He has had multiple urinary tract infections in the past.  In addition he has a history of kidney stones.  He had kidney stone surgery just a couple months ago.  He states for the last 1 week he has been having burning on urination, urinary frequency, some difficulty initiating his urinary stream.  His urine is cloudy, bloody, and at times malodorous.  No abdominal pain.  No vomiting or fever.  He does have some nausea/decreased appetite.  No flank pain or abdominal pain.  He is never had any prostate problems or prostate infection that he knows of. Patient has many years of treatment for Crohn's disease and has chronic diarrhea and Crohn's problems.  He is maintained on multiple medications and states that he has been immunocompromised for years.  He is compliant with his medicines and medical doctors.  He sees gastroenterology, urology, family medicine, neurology, and orthopedics.  He has history of multiple allergies to antibiotics.  He does have a history of staph sepsis that he states was from a urinary tract infection.   Past Medical History:  Diagnosis Date  . Adenomatous colon polyp 10/1999  . Anxiety   . Arthritis    neck, yoga helps.  . Cancer ([place])    skin  . Cataract   . Complex partial seizure ([place])   . Crohn's disease of small and large intestines ([place]) 1999  . Depression   . Diabetes mellitus    no meds at this time 10-24-17  . Esophageal stricture   . External hemorrhoids   . GERD (gastroesophageal reflux disease)   . History of kidney stones    3 large stones still present  . Hyperlipemia   . Hypertension   . Migraines   . MRSA (methicillin resistant Staphylococcus aureus) 10/2010     . Osteoporosis   . PONV (postoperative nausea and vomiting)   . PTSD (post-traumatic stress disorder)   . Renal cyst, acquired, left 07/08/2017  . Renal lesion 05/28/2017  . Seizures ([place])   . Skin cancer    squamous cell multiple; [facility]; followed every 3 months.  . Sleep apnea    CPAP machine, uses nightly  . Staphylococcus aureus bacteremia with sepsis ([place]) 05/28/2017    Patient Active Problem List   Diagnosis Date Noted  . S/P shoulder surgery 03/05/2018  . Excessive daytime sleepiness 02/18/2018  . Crohn's disease of colon with rectal bleeding ([place]) 02/18/2018  . Blood in stool 02/18/2018  . Iron deficiency anemia due to chronic blood loss 02/18/2018  . Iron deficiency anemia due to sideropenic dysphagia 02/18/2018  . Renal calculus, left 02/02/2018  . Fatigue 12/16/2017  . History of sepsis 12/16/2017  . Therapeutic drug monitoring 10/07/2017  . Renal cyst, acquired, left 07/08/2017  . Frequent falls 07/08/2017  . Staphylococcus aureus bacteremia with sepsis ([place]) 05/28/2017  . Renal lesion 05/28/2017  . Bacteremia due to Staphylococcus aureus 05/06/2017  . MSSA bacteremia 05/06/2017  . Confusion 04/02/2017  . Syncope 08/30/2016  . Depression 04/30/2016  . Memory loss 04/30/2016  . OSA on CPAP 10/31/2015  . Nonepileptic episode ([place]) 04/25/2015  . Seizure ([place]) 04/04/2013  .  Abnormal EKG 03/12/2013  . DOE (dyspnea on exertion) 03/12/2013  . Complex partial seizure ([place]) 04/19/2012  . DM2 (diabetes mellitus, type 2) ([place]) 09/25/2011  . Altered mental status 09/25/2011  . OTHER DYSPHAGIA 03/27/2009  . TRANSAMINASES, SERUM, ELEVATED 03/27/2009  . Cough 11/10/2008  . GERD 12/07/2007  . COLONIC POLYPS, ADENOMATOUS, HX OF 12/07/2007  . EXTERNAL HEMORRHOIDS 09/11/2007  . CROHN'S DISEASE, LARGE AND SMALL INTESTINES 09/11/2007  . OSTEOPOROSIS 09/11/2007  . HYPERLIPIDEMIA NEC/NOS 02/27/2007  . Essential hypertension 02/27/2007    Past Surgical History:   Procedure Laterality Date  . CATARACT EXTRACTION Bilateral   . COLONOSCOPY    . ELBOW SURGERY  2012   elbow MRSA infection   . EYE SURGERY     cataracts removed, /w IOL  . INSERTION OF MESH N/A 07/01/2016   Procedure: INSERTION OF MESH;  Surgeon: [name], MD;  Location: [facility];  Service: General;  Laterality: N/A;  . IR URETERAL STENT LEFT NEW ACCESS W/O SEP NEPHROSTOMY CATH  02/02/2018  . NEPHROLITHOTOMY Left 02/02/2018   Procedure: NEPHROLITHOTOMY PERCUTANEOUS;  Surgeon: [name], MD;  Location: [facility];  Service: Urology;  Laterality: Left;  . SCALP LACERATION REPAIR Right 10/16/2017   From fall/staples  . SHOULDER ARTHROSCOPY Right 03/05/2018   Procedure: ARTHROSCOPY SHOULDER AND OPEN DISTAL CLAVICLE EXCISION;  Surgeon: [name], MD;  Location: [facility];  Service: Orthopedics;  Laterality: Right;  . SHOULDER SURGERY    . SINUS SURGERY WITH INSTATRAK    . TEE WITHOUT CARDIOVERSION N/A 05/09/2017   Procedure: TRANSESOPHAGEAL ECHOCARDIOGRAM (TEE);  Surgeon: [name], MD;  Location: [facility];  Service: Cardiovascular;  Laterality: N/A;  . TRANSTHORACIC ECHOCARDIOGRAM  02/22/2011   EF 55-65%; increased pattern of LVH with mild conc hypertrophy, abnormal relaxation & increased filling pressure (grade 2 diastolic dysfunction); atrial septum thickened (lipomatous hypertrophy)  . UMBILICAL HERNIA REPAIR N/A 07/01/2016   Procedure: UMBILICAL HERNIA REPAIR WITH MESH;  Surgeon: [name], MD;  Location: [facility];  Service: General;  Laterality: N/A;  . UPPER GASTROINTESTINAL ENDOSCOPY    . VASECTOMY         Home Medications    Prior to Admission medications   Medication Sig Start Date End Date Taking? Authorizing Provider  Ascorbic Acid (VITAMIN C) 1000 MG tablet Take 1,000 mg by mouth at bedtime.    [provider]  aspirin EC 81 MG tablet Take 81 mg at bedtime by mouth.     [provider]  Calcium Carbonate-Vitamin D (CALCIUM 600+D PO) Take 1  tablet by mouth 2 (two) times daily.    [provider]  cholecalciferol (VITAMIN D) 1000 units tablet Take 1,000 Units at bedtime by mouth.     [provider]  ciprofloxacin (CIPRO) 500 MG tablet Take 1 tablet (500 mg total) by mouth every 12 (twelve) hours. 04/24/18   Raylene Everts, MD  eletriptan (RELPAX) 20 MG tablet Take 1 tablet (20 mg total) by mouth as needed for migraine or headache. May repeat in 2 hours if headache persists or recurs. 04/07/18   Rutherford Guys, MD  esomeprazole (NEXIUM) 40 MG capsule TAKE 1 CAPSULE TWICE A DAY.MAKE AN APPOINTMENT FOR    FURTHER REFILLS 01/30/18   Ladene Artist, MD  Eszopiclone 3 MG TABS Take 1 tablet (3 mg total) by mouth at bedtime. Take immediately before bedtime 03/21/17   Wardell Honour, MD  ferrous sulfate 325 (65 FE) MG tablet Take 325 mg at bedtime by  mouth.     [provider]  folic acid (FOLVITE) 1 MG tablet Take 2 tablets (2 mg total) by mouth daily. 10/27/17   Wardell Honour, MD  glucose blood (CHOICE DM FORA G20 TEST STRIPS) test strip Use as instructed 11/13/12   [name], MD  Ibuprofen-diphenhydrAMINE HCl (ADVIL PM) 200-25 MG CAPS Take 1 tablet by mouth at bedtime as needed (sleep).    [provider]  Lactase (DAIRY-RELIEF PO) Take 2 tablets by mouth 2 (two) times daily.     [provider]  lamoTRIgine (LAMICTAL) 150 MG tablet Take 1 tablet (150 mg total) by mouth daily. Patient taking differently: Take 150 mg by mouth 2 (two) times daily.  10/09/17   Dennie Bible, NP  mercaptopurine (PURINETHOL) 50 MG tablet TAKE 2 TABLETS DAILY ON AN EMPTY STOMACH 1 HOUR BEFOREOR 2 HOURS AFTER MEALS     (CAUTION CHEMOTHERAPY) 04/21/18   Ladene Artist, MD  mesalamine (LIALDA) 1.2 g EC tablet TAKE 2 TABLETS DAILY (2.4 GTOTAL) Patient taking differently: Take 2.4 g by mouth daily with breakfast.  01/30/18   Ladene Artist, MD  methscopolamine (PAMINE FORTE) 5 MG tablet Take 1 tablet (5 mg  total) by mouth 2 (two) times daily. 07/21/17   Ladene Artist, MD  ONE TOUCH ULTRA TEST test strip USE AS INSTRUCTED    Weber, Damaris Hippo, PA-C  potassium chloride SA (K-DUR,KLOR-CON) 20 MEQ tablet Take 1 tablet (20 mEq total) by mouth 2 (two) times daily. 10/27/17   Wardell Honour, MD  Probiotic Product (PROBIOTIC PO) Take 1 tablet at bedtime by mouth.     [provider]  rifaximin (XIFAXAN) 550 MG TABS tablet Take 1 tablet (550 mg total) by mouth 2 (two) times daily. 08/27/17   Ladene Artist, MD  sertraline (ZOLOFT) 100 MG tablet Take 200 mg daily with breakfast by mouth.  03/16/17   [provider]  vitamin B-12 (CYANOCOBALAMIN) 500 MCG tablet Take 500 mcg by mouth at bedtime.    [provider]    Family History Family History  Problem Relation Age of Onset  . Colon polyps Father   . Stroke Father   . Hyperlipidemia Father   . Hypertension Father   . Heart disease Mother        CABG at age 46  . Hyperlipidemia Mother   . Hypertension Mother   . Stroke Paternal Grandmother   . Stroke Paternal Grandfather   . Other Child        tetrology of fallot (cornealia deland syndrome)  . Colon cancer Neg Hx   . Esophageal cancer Neg Hx   . Stomach cancer Neg Hx   . Rectal cancer Neg Hx     Social History Social History   Tobacco Use  . Smoking status: Never Smoker  . Smokeless tobacco: Never Used  Substance Use Topics  . Alcohol use: No  . Drug use: No     Allergies   Azithromycin; Beef-derived products; Milk-related compounds; Penicillins; Sulfonamide derivatives; and Dilaudid [hydromorphone hcl]   Review of Systems Review of Systems  Constitutional: Positive for fatigue. Negative for chills and fever.  HENT: Negative for ear pain and sore throat.   Eyes: Negative for pain and visual disturbance.  Respiratory: Negative for cough and shortness of breath.   Cardiovascular: Negative for chest pain and palpitations.  Gastrointestinal: Positive for  abdominal pain, diarrhea and nausea. Negative for vomiting.  Genitourinary: Positive for difficulty urinating, dysuria,  frequency, hematuria and urgency.  Musculoskeletal: Negative for arthralgias and back pain.  Skin: Negative for color change and rash.  Neurological: Negative for seizures and syncope.  Psychiatric/Behavioral: Negative for sleep disturbance. The patient is not nervous/anxious.   All other systems reviewed and are negative.    Physical Exam Triage Vital Signs ED Triage Vitals  Enc Vitals Group     BP 04/24/18 1341 125/75     Pulse Rate 04/24/18 1341 81     Resp 04/24/18 1341 16     Temp 04/24/18 1341 98.4 F (36.9 C)     Temp Source 04/24/18 1341 Oral     SpO2 04/24/18 1341 96 %     Weight 04/24/18 1344 199 lb (90.3 kg)     Height 04/24/18 1344 5' 11"  (1.803 m)     Head Circumference --      Peak Flow --      Pain Score 04/24/18 1343 6     Pain Loc --      Pain Edu? --      Excl. in [place]? --    No data found.  Updated Vital Signs BP 125/75 (BP Location: Right Arm)   Pulse 81   Temp 98.4 F (36.9 C) (Oral)   Resp 16   Ht 5' 11"  (1.803 m)   Wt 90.3 kg   SpO2 96%   BMI 27.75 kg/m      Physical Exam  Constitutional: He appears well-developed and well-nourished. No distress.  Appears moderately ill.  Appears older than stated age.  HENT:  Head: Normocephalic and atraumatic.  Mouth/Throat: Oropharynx is clear and moist.  Eyes: Pupils are equal, round, and reactive to light. Conjunctivae are normal.  Neck: Normal range of motion.  Cardiovascular: Normal rate, regular rhythm and normal heart sounds.  Pulmonary/Chest: Effort normal and breath sounds normal. No respiratory distress.  No CVAT  Abdominal: Soft. He exhibits no distension. There is tenderness.  General diffuse tenderness  Musculoskeletal: Normal range of motion. He exhibits no edema.  Moves slowly.  Slightly flexed posture.  Cautious gait  Lymphadenopathy:    He has no cervical  adenopathy.  Neurological: He is alert.  Skin: Skin is warm and dry.  Skin is sun damaged, flaking  Psychiatric: He has a normal mood and affect. His behavior is normal.     UC Treatments / Results  Labs (all labs ordered are listed, but only abnormal results are displayed) Labs Reviewed  POCT URINALYSIS DIP (DEVICE) - Abnormal; Notable for the following components:      Result Value   Hgb urine dipstick SMALL (*)    All other components within normal limits  URINE CULTURE    EKG None  Radiology No results found.  Procedures Procedures (including critical care time)  Medications Ordered in UC Medications - No data to display  Initial Impression / Assessment and Plan / UC Course  I have reviewed the triage vital signs and the nursing notes.  Pertinent labs & imaging results that were available during my care of the patient were reviewed by me and considered in my medical decision making (see chart for details).     Discussed urinary tract infections.  This seems most like a cystitis.  He does not have any signs of pyelonephritis or prostatitis.  Plan to treat him with 7 days of Cipro.  He states this antibiotic works best for him.  He has multiple drug allergies.  He is never had any trouble with Cipro  before.  I told him that it is really not the drug of choice at this point, however, with his good response to it in the past I think it is reasonable.  Culture is pending. Final Clinical Impressions(s) / UC Diagnoses   Final diagnoses:  Lower urinary tract infectious disease     Discharge Instructions     Drink extra water Take the cipro as directed Follow up with your PCP or urologist The culture will be available in 2-3 days.  If there are any abnormal findings that require change in medicine or indicate a positive result, you will be notified.  If all of your tests are normal, you will not be called.       ED Prescriptions    Medication Sig Dispense Auth.  Provider   ciprofloxacin (CIPRO) 500 MG tablet Take 1 tablet (500 mg total) by mouth every 12 (twelve) hours. 14 tablet Raylene Everts, MD     Controlled Substance Prescriptions [place] Controlled Substance Registry consulted? Not Applicable   Raylene Everts, MD 04/24/18 1428

## 2018-04-24 NOTE — Discharge Instructions (Signed)
Drink extra water Take the cipro as directed Follow up with your PCP or urologist The culture will be available in 2-3 days.  If there are any abnormal findings that require change in medicine or indicate a positive result, you will be notified.  If all of your tests are normal, you will not be called.

## 2018-04-25 LAB — URINE CULTURE: CULTURE: NO GROWTH

## 2018-04-27 DIAGNOSIS — M25611 Stiffness of right shoulder, not elsewhere classified: Secondary | ICD-10-CM | POA: Diagnosis not present

## 2018-04-28 DIAGNOSIS — C44622 Squamous cell carcinoma of skin of right upper limb, including shoulder: Secondary | ICD-10-CM | POA: Diagnosis not present

## 2018-04-28 DIAGNOSIS — L570 Actinic keratosis: Secondary | ICD-10-CM | POA: Diagnosis not present

## 2018-04-28 DIAGNOSIS — L821 Other seborrheic keratosis: Secondary | ICD-10-CM | POA: Diagnosis not present

## 2018-04-28 DIAGNOSIS — Z85828 Personal history of other malignant neoplasm of skin: Secondary | ICD-10-CM | POA: Diagnosis not present

## 2018-04-28 DIAGNOSIS — D225 Melanocytic nevi of trunk: Secondary | ICD-10-CM | POA: Diagnosis not present

## 2018-04-28 DIAGNOSIS — C4442 Squamous cell carcinoma of skin of scalp and neck: Secondary | ICD-10-CM | POA: Diagnosis not present

## 2018-04-29 ENCOUNTER — Other Ambulatory Visit: Payer: Self-pay | Admitting: Family Medicine

## 2018-04-29 DIAGNOSIS — M25611 Stiffness of right shoulder, not elsewhere classified: Secondary | ICD-10-CM | POA: Diagnosis not present

## 2018-04-29 DIAGNOSIS — E876 Hypokalemia: Secondary | ICD-10-CM

## 2018-04-29 MED ORDER — POTASSIUM CHLORIDE CRYS ER 20 MEQ PO TBCR: 20.0000 meq | EXTENDED_RELEASE_TABLET | Freq: Two times a day (BID) | ORAL | 0 refills | Status: DC

## 2018-04-29 NOTE — Telephone Encounter (Signed)
Copied from [place] [id number]. Topic: Quick Communication - Rx Refill/Question >> Apr 29, 2018  4:27 PM Virl Axe D wrote: Medication: potassium chloride SA (K-DUR,KLOR-CON) 20 MEQ tablet  Has the patient contacted their pharmacy? Yes.   (Agent: If no, request that the patient contact the pharmacy for the refill.) (Agent: If yes, when and what did the pharmacy advise?)  Preferred Pharmacy (with phone number or street name): [facility] [place], [place] to [facility] [phone] (Phone) [phone] (Fax)  Significant History/Details    Agent: Please be advised that RX refills may take up to 3 business days. We ask that you follow-up with your pharmacy.

## 2018-05-04 DIAGNOSIS — G4733 Obstructive sleep apnea (adult) (pediatric): Secondary | ICD-10-CM | POA: Diagnosis not present

## 2018-05-20 ENCOUNTER — Telehealth: Payer: Self-pay | Admitting: Family Medicine

## 2018-05-20 NOTE — Telephone Encounter (Signed)
Called and spoke with pt regarding their appt on 07/09/18. Due to provider schedule changes, we needed to get pt rescheduled. I was able to reschedule with Dr. Pamella Pert on 07/09/18 at 10:40. I advised of time, building and late policy. Pt acknowledged.

## 2018-06-03 DIAGNOSIS — G4733 Obstructive sleep apnea (adult) (pediatric): Secondary | ICD-10-CM | POA: Diagnosis not present

## 2018-06-14 ENCOUNTER — Other Ambulatory Visit: Payer: Self-pay | Admitting: Gastroenterology

## 2018-06-22 ENCOUNTER — Telehealth: Payer: Self-pay | Admitting: Gastroenterology

## 2018-06-22 MED ORDER — ESOMEPRAZOLE MAGNESIUM 40 MG PO CPDR: DELAYED_RELEASE_CAPSULE | ORAL | 0 refills | Status: DC

## 2018-06-22 MED ORDER — METHSCOPOLAMINE BROMIDE 5 MG PO TABS: 5.0000 mg | ORAL_TABLET | Freq: Two times a day (BID) | ORAL | 0 refills | Status: DC

## 2018-06-22 MED ORDER — MESALAMINE 1.2 G PO TBEC: 2.4000 g | DELAYED_RELEASE_TABLET | Freq: Every day | ORAL | 0 refills | Status: DC

## 2018-06-22 MED ORDER — RIFAXIMIN 550 MG PO TABS: 550.0000 mg | ORAL_TABLET | Freq: Two times a day (BID) | ORAL | 0 refills | Status: DC

## 2018-06-22 NOTE — Telephone Encounter (Signed)
Pt is needing refill on medication

## 2018-06-22 NOTE — Telephone Encounter (Signed)
Patient states he needs refills of Nexium, Pamine, Mercpatopurine and Lialda sent to [facility] for 30 day supply and then a prescription sent to mail order for 90 day supply. Informed patient I will send the 30 day supply be he needs to schedule a follow up visit with Dr. Fuller Plan before sending the 90 day supply. Patient scheduled appt for 07/15/17 at 2:00pm. Patient verbalized understanding. Prescriptions sent to [facility].

## 2018-07-03 DIAGNOSIS — G4733 Obstructive sleep apnea (adult) (pediatric): Secondary | ICD-10-CM | POA: Diagnosis not present

## 2018-07-04 DIAGNOSIS — G4733 Obstructive sleep apnea (adult) (pediatric): Secondary | ICD-10-CM | POA: Diagnosis not present

## 2018-07-09 ENCOUNTER — Other Ambulatory Visit: Payer: Self-pay

## 2018-07-09 ENCOUNTER — Ambulatory Visit (INDEPENDENT_AMBULATORY_CARE_PROVIDER_SITE_OTHER): Payer: 59 | Admitting: Family Medicine

## 2018-07-09 ENCOUNTER — Encounter: Payer: Self-pay | Admitting: Family Medicine

## 2018-07-09 ENCOUNTER — Ambulatory Visit: Payer: 59 | Admitting: Family Medicine

## 2018-07-09 VITALS — BP 126/79 | HR 84 | Temp 98.3°F | Ht 71.0 in | Wt 194.4 lb

## 2018-07-09 DIAGNOSIS — R7989 Other specified abnormal findings of blood chemistry: Secondary | ICD-10-CM

## 2018-07-09 DIAGNOSIS — D6489 Other specified anemias: Secondary | ICD-10-CM | POA: Diagnosis not present

## 2018-07-09 DIAGNOSIS — R296 Repeated falls: Secondary | ICD-10-CM | POA: Diagnosis not present

## 2018-07-09 DIAGNOSIS — I10 Essential (primary) hypertension: Secondary | ICD-10-CM | POA: Diagnosis not present

## 2018-07-09 DIAGNOSIS — E7211 Homocystinuria: Secondary | ICD-10-CM | POA: Diagnosis not present

## 2018-07-09 DIAGNOSIS — E119 Type 2 diabetes mellitus without complications: Secondary | ICD-10-CM | POA: Diagnosis not present

## 2018-07-09 DIAGNOSIS — E876 Hypokalemia: Secondary | ICD-10-CM | POA: Diagnosis not present

## 2018-07-09 MED ORDER — FOLIC ACID 1 MG PO TABS: 2.0000 mg | ORAL_TABLET | Freq: Every day | ORAL | 3 refills | Status: DC

## 2018-07-09 MED ORDER — POTASSIUM CHLORIDE CRYS ER 20 MEQ PO TBCR: 20.0000 meq | EXTENDED_RELEASE_TABLET | Freq: Two times a day (BID) | ORAL | 3 refills | Status: DC

## 2018-07-09 NOTE — Patient Instructions (Signed)
° ° ° °  If you have lab work done today you will be contacted with your lab results within the next 2 weeks.  If you have not heard from us then please contact us. The fastest way to get your results is to register for My Chart. ° ° °IF you received an x-ray today, you will receive an invoice from [facility]. Please contact [facility] at [phone] with questions or concerns regarding your invoice.  ° °IF you received labwork today, you will receive an invoice from [facility]. Please contact [facility] at [phone] with questions or concerns regarding your invoice.  ° °Our billing staff will not be able to assist you with questions regarding bills from these companies. ° °You will be contacted with the lab results as soon as they are available. The fastest way to get your results is to activate your My Chart account. Instructions are located on the last page of this paperwork. If you have not heard from us regarding the results in 2 weeks, please contact this office. °  ° ° ° °

## 2018-07-09 NOTE — Progress Notes (Signed)
1/16/202011:23 AM  Sharon Seller [birth year], 58 y.o. male [id number]  Chief Complaint  Patient presents with  . Follow-up    chronic medical conditions  . Medication Refill    folic acid and potassium    HPI:   Patient is a 58 y.o. male with past medical history significant for DM2, HTN, chrons disease with cirrhosis, anxiety, depression, seizures who presents today for routine followup  Last OV oct 2019  Since our last seen in ED for UTI, treated with cipro x 7 days Urine cx showed no growth saw urology in aug, removed many stones, Dr Karsten Ro Has not seen urology since  Long standing h/o recurrent falls. Continues to have falls, 1-2 month He starts walking, gets dizzy, losses his balance and falls Evaluated for similar by cards march 2018 - negative workup Neuro thinks that he meets criteria for conversion disorder Sees counseling and neuropsych but does not want to do medication Doing cpap every  Uses a cane Has done PT test Last neuro OV may 2019  Will be doing last shingrix vaccine next month  Lab Results  Component Value Date   HGBA1C 4.0 (L) 01/26/2018   HGBA1C 4.7 10/27/2017   HGBA1C 4.6 05/23/2017   Lab Results  Component Value Date   MICROALBUR 7.4 05/15/2016   [place] 64 10/27/2017   CREATININE 0.91 03/05/2018    Fall Risk  07/09/2018 04/07/2018 02/18/2018 01/05/2018 12/16/2017  Falls in the past year? 0 No Yes Yes No  Number falls in past yr: - - 1 1 -  Injury with Fall? - - Yes No -  Comment - - - - -  Risk Factor Category  - - High Fall Risk - -  Risk for fall due to : - - - - -  Follow up - - Education provided - -     Depression screen [facility] 2/9 07/09/2018 04/07/2018 01/05/2018  Decreased Interest 0 0 0  Down, Depressed, Hopeless 0 0 0  PHQ - 2 Score 0 0 0  Altered sleeping - - -  Tired, decreased energy - - -  Change in appetite - - -  Feeling bad or failure about yourself  - - -  Trouble concentrating - - -  Moving slowly or  fidgety/restless - - -  Suicidal thoughts - - -  PHQ-9 Score - - -  Difficult doing work/chores - - -  Some recent data might be hidden    Allergies  Allergen Reactions  . Azithromycin Swelling    SWELLING REACTION UNSPECIFIED   . Beef-Derived Products Diarrhea    RED MEAT > UNSPECIFIED REACTION   . Milk-Related Compounds Diarrhea    DAIRY >UNSPECIFIED REACTION   . Penicillins Hives    Rash as child- tolerates Ancef/CHILDHOOD ALLERGY Has patient had a PCN reaction causing immediate rash, facial/tongue/throat swelling, SOB or lightheadedness with hypotension: YES Has patient had a PCN reaction causing severe rash involving mucus membranes or skin necrosis: Unknown Has patient had a PCN reaction that required hospitalization: Unknown Has patient had a PCN reaction occurring within the last 10 years:Unknown If all of the above answers are "NO", then may proceed with Cephalosporin [place]  . Sulfonamide Derivatives Hives  . Dilaudid [Hydromorphone Hcl] Nausea And Vomiting    Prior to Admission medications   Medication Sig Start Date End Date Taking? Authorizing Provider  aspirin EC 81 MG tablet Take 81 mg at bedtime by mouth.    Yes [provider]  Calcium Carbonate-Vitamin  D (CALCIUM 600+D PO) Take 1 tablet by mouth 2 (two) times daily.   Yes [provider]  cholecalciferol (VITAMIN D) 1000 units tablet Take 1,000 Units at bedtime by mouth.    Yes [provider]  esomeprazole (NEXIUM) 40 MG capsule TAKE 1 CAPSULE TWICE A DAY. 06/22/18  Yes Ladene Artist, MD  folic acid (FOLVITE) 1 MG tablet Take 2 tablets (2 mg total) by mouth daily. 10/27/17  Yes Wardell Honour, MD  lamoTRIgine (LAMICTAL) 150 MG tablet Take 1 tablet (150 mg total) by mouth daily. Patient taking differently: Take 150 mg by mouth 2 (two) times daily.  10/09/17  Yes Dennie Bible, NP  mercaptopurine (PURINETHOL) 50 MG tablet TAKE 2 TABLETS DAILY ON AN EMPTY STOMACH 1 HOUR BEFOREOR 2  HOURS AFTER MEALS     (CAUTION CHEMOTHERAPY) 04/21/18  Yes Ladene Artist, MD  methscopolamine (PAMINE FORTE) 5 MG tablet Take 1 tablet (5 mg total) by mouth 2 (two) times daily. 06/22/18  Yes Ladene Artist, MD  potassium chloride SA (K-DUR,KLOR-CON) 20 MEQ tablet Take 1 tablet (20 mEq total) by mouth 2 (two) times daily. 04/29/18  Yes Rutherford Guys, MD  Probiotic Product (PROBIOTIC PO) Take 1 tablet at bedtime by mouth.    Yes [provider]  sertraline (ZOLOFT) 100 MG tablet Take 200 mg daily with breakfast by mouth.  03/16/17  Yes [provider]  Ascorbic Acid (VITAMIN C) 1000 MG tablet Take 1,000 mg by mouth at bedtime.    [provider]  ciprofloxacin (CIPRO) 500 MG tablet Take 1 tablet (500 mg total) by mouth every 12 (twelve) hours. 04/24/18   Raylene Everts, MD  eletriptan (RELPAX) 20 MG tablet Take 1 tablet (20 mg total) by mouth as needed for migraine or headache. May repeat in 2 hours if headache persists or recurs. 04/07/18   Rutherford Guys, MD  Eszopiclone 3 MG TABS Take 1 tablet (3 mg total) by mouth at bedtime. Take immediately before bedtime 03/21/17   Wardell Honour, MD  ferrous sulfate 325 (65 FE) MG tablet Take 325 mg at bedtime by mouth.     [provider]  glucose blood (CHOICE DM FORA G20 TEST STRIPS) test strip Use as instructed 11/13/12   [name], MD  Ibuprofen-diphenhydrAMINE HCl (ADVIL PM) 200-25 MG CAPS Take 1 tablet by mouth at bedtime as needed (sleep).    [provider]  Lactase (DAIRY-RELIEF PO) Take 2 tablets by mouth 2 (two) times daily.     [provider]  mesalamine (LIALDA) 1.2 g EC tablet Take 2 tablets (2.4 g total) by mouth daily with breakfast. 06/22/18   Ladene Artist, MD  ONE TOUCH ULTRA TEST test strip USE AS INSTRUCTED    Weber, Damaris Hippo, PA-C  rifaximin (XIFAXAN) 550 MG TABS tablet Take 1 tablet (550 mg total) by mouth 2 (two) times daily. 06/22/18   Ladene Artist, MD    saccharomyces boulardii (FLORASTOR) 250 MG capsule Take by mouth.    [provider]  traZODone (DESYREL) 100 MG tablet  05/28/18   [provider]  vitamin B-12 (CYANOCOBALAMIN) 500 MCG tablet Take 500 mcg by mouth at bedtime.    [provider]    Past Medical History:  Diagnosis Date  . Adenomatous colon polyp 10/1999  . Anxiety   . Arthritis    neck, yoga helps.  . Cancer ([place])    skin  . Cataract   .  Complex partial seizure ([place])   . Crohn's disease of small and large intestines ([place]) 1999  . Depression   . Diabetes mellitus    no meds at this time 10-24-17  . Esophageal stricture   . External hemorrhoids   . GERD (gastroesophageal reflux disease)   . History of kidney stones    3 large stones still present  . Hyperlipemia   . Hypertension   . Migraines   . MRSA (methicillin resistant Staphylococcus aureus) 10/2010   . Osteoporosis   . PONV (postoperative nausea and vomiting)   . PTSD (post-traumatic stress disorder)   . Renal cyst, acquired, left 07/08/2017  . Renal lesion 05/28/2017  . Seizures ([place])   . Skin cancer    squamous cell multiple; [facility]; followed every 3 months.  . Sleep apnea    CPAP machine, uses nightly  . Staphylococcus aureus bacteremia with sepsis ([place]) 05/28/2017    Past Surgical History:  Procedure Laterality Date  . CATARACT EXTRACTION Bilateral   . COLONOSCOPY    . ELBOW SURGERY  2012   elbow MRSA infection   . EYE SURGERY     cataracts removed, /w IOL  . INSERTION OF MESH N/A 07/01/2016   Procedure: INSERTION OF MESH;  Surgeon: [name], MD;  Location: [facility];  Service: General;  Laterality: N/A;  . IR URETERAL STENT LEFT NEW ACCESS W/O SEP NEPHROSTOMY CATH  02/02/2018  . NEPHROLITHOTOMY Left 02/02/2018   Procedure: NEPHROLITHOTOMY PERCUTANEOUS;  Surgeon: [name], MD;  Location: [facility];  Service: Urology;  Laterality: Left;  . SCALP LACERATION REPAIR Right 10/16/2017   From fall/staples  . SHOULDER  ARTHROSCOPY Right 03/05/2018   Procedure: ARTHROSCOPY SHOULDER AND OPEN DISTAL CLAVICLE EXCISION;  Surgeon: [name], MD;  Location: [facility];  Service: Orthopedics;  Laterality: Right;  . SHOULDER SURGERY    . SINUS SURGERY WITH INSTATRAK    . TEE WITHOUT CARDIOVERSION N/A 05/09/2017   Procedure: TRANSESOPHAGEAL ECHOCARDIOGRAM (TEE);  Surgeon: [name], MD;  Location: [facility];  Service: Cardiovascular;  Laterality: N/A;  . TRANSTHORACIC ECHOCARDIOGRAM  02/22/2011   EF 55-65%; increased pattern of LVH with mild conc hypertrophy, abnormal relaxation & increased filling pressure (grade 2 diastolic dysfunction); atrial septum thickened (lipomatous hypertrophy)  . UMBILICAL HERNIA REPAIR N/A 07/01/2016   Procedure: UMBILICAL HERNIA REPAIR WITH MESH;  Surgeon: [name], MD;  Location: [facility];  Service: General;  Laterality: N/A;  . UPPER GASTROINTESTINAL ENDOSCOPY    . VASECTOMY      Social History   Tobacco Use  . Smoking status: Never Smoker  . Smokeless tobacco: Never Used  Substance Use Topics  . Alcohol use: No    Family History  Problem Relation Age of Onset  . Colon polyps Father   . Stroke Father   . Hyperlipidemia Father   . Hypertension Father   . Heart disease Mother        CABG at age 74  . Hyperlipidemia Mother   . Hypertension Mother   . Stroke Paternal Grandmother   . Stroke Paternal Grandfather   . Other Child        tetrology of fallot (cornealia deland syndrome)  . Colon cancer Neg Hx   . Esophageal cancer Neg Hx   . Stomach cancer Neg Hx   . Rectal cancer Neg Hx     Review of Systems  Constitutional: Negative for chills and fever.  Respiratory: Negative for cough and shortness of breath.   Cardiovascular: Negative  for chest pain, palpitations and leg swelling.  Gastrointestinal: Negative for abdominal pain, nausea and vomiting.     OBJECTIVE:  Blood pressure 126/79, pulse 84, temperature 98.3 F (36.8 C), temperature source Oral,  height 5' 11"  (1.803 m), weight 194 lb 6.4 oz (88.2 kg), SpO2 93 %. Body mass index is 27.11 kg/m.   Physical Exam Vitals signs and nursing note reviewed.  Constitutional:      Appearance: He is well-developed.  HENT:     Head: Normocephalic and atraumatic.  Eyes:     Conjunctiva/sclera: Conjunctivae normal.     Pupils: Pupils are equal, round, and reactive to light.  Neck:     Musculoskeletal: Neck supple.  Pulmonary:     Effort: Pulmonary effort is normal.  Skin:    General: Skin is warm and dry.  Neurological:     Mental Status: He is alert and oriented to person, place, and time.     ASSESSMENT and PLAN  1. Essential hypertension Controlled. Continue current regime.  - Comprehensive metabolic panel - Lipid panel  2. Anemia due to other cause, not classified - CBC  3. Frequent falls Extensive workup, component of conversion disorder present. Strongly encouraged him working with psych. Cont with fall precautions  4. Type 2 diabetes mellitus without complication, without long-term current use of insulin (HCC) Diet controlled. H/o significant weight loss. Cont with LFM - Comprehensive metabolic panel - Hemoglobin A1c - Lipid panel  5. Elevated homocysteine (HCC) - folic acid (FOLVITE) 1 MG tablet; Take 2 tablets (2 mg total) by mouth daily.  6. Hypokalemia - potassium chloride SA (K-DUR,KLOR-CON) 20 MEQ tablet; Take 1 tablet (20 mEq total) by mouth 2 (two) times daily.  Other orders   Return in about 6 months (around 01/07/2019).    Rutherford Guys, MD Primary Care at [facility] [place], [place] [id number] Ph.  [phone] Fax [phone]

## 2018-07-13 LAB — COMPREHENSIVE METABOLIC PANEL
ALT: 22 IU/L (ref 0–44)
AST: 34 IU/L (ref 0–40)
Albumin/Globulin Ratio: 2.4 — ABNORMAL HIGH (ref 1.2–2.2)
Albumin: 4.6 g/dL (ref 3.5–5.5)
Alkaline Phosphatase: 129 IU/L — ABNORMAL HIGH (ref 39–117)
BUN/Creatinine Ratio: 10 (ref 9–20)
BUN: 10 mg/dL (ref 6–24)
Bilirubin Total: 2.8 mg/dL — ABNORMAL HIGH (ref 0.0–1.2)
CO2: 22 mmol/L (ref 20–29)
Calcium: 9.2 mg/dL (ref 8.7–10.2)
Chloride: 106 mmol/L (ref 96–106)
Creatinine, Ser: 0.98 mg/dL (ref 0.76–1.27)
GFR calc Af Amer: 99 mL/min/{1.73_m2} (ref >59)
GFR calc non Af Amer: 86 mL/min/{1.73_m2} (ref >59)
Globulin, Total: 1.9 g/dL (ref 1.5–4.5)
Glucose: 90 mg/dL (ref 65–99)
Potassium: 4 mmol/L (ref 3.5–5.2)
Sodium: 142 mmol/L (ref 134–144)
Total Protein: 6.5 g/dL (ref 6.0–8.5)

## 2018-07-13 LAB — HEMOGLOBIN A1C
Est. average glucose Bld gHb Est-mCnc: 80 mg/dL
Hgb A1c MFr Bld: 4.4 % — ABNORMAL LOW (ref 4.8–5.6)

## 2018-07-13 LAB — CBC
Hematocrit: 34.1 % — ABNORMAL LOW (ref 37.5–51.0)
Hemoglobin: 12.3 g/dL — ABNORMAL LOW (ref 13.0–17.7)
MCH: 38.3 pg — ABNORMAL HIGH (ref 26.6–33.0)
MCHC: 36.1 g/dL — ABNORMAL HIGH (ref 31.5–35.7)
MCV: 106 fL — ABNORMAL HIGH (ref 79–97)
Platelets: 92 10*3/uL — CL (ref 150–450)
RBC: 3.21 x10E6/uL — ABNORMAL LOW (ref 4.14–5.80)
RDW: 16 % — ABNORMAL HIGH (ref 11.6–15.4)
WBC: 3.7 10*3/uL (ref 3.4–10.8)

## 2018-07-13 LAB — LIPID PANEL
Chol/HDL Ratio: 2.1 ratio (ref 0.0–5.0)
Cholesterol, Total: 169 mg/dL (ref 100–199)
HDL: 80 mg/dL (ref >39)
LDL Calculated: 69 mg/dL (ref 0–99)
Triglycerides: 99 mg/dL (ref 0–149)
VLDL Cholesterol Cal: 20 mg/dL (ref 5–40)

## 2018-07-15 ENCOUNTER — Other Ambulatory Visit: Payer: Self-pay

## 2018-07-15 ENCOUNTER — Other Ambulatory Visit (INDEPENDENT_AMBULATORY_CARE_PROVIDER_SITE_OTHER): Payer: 59

## 2018-07-15 ENCOUNTER — Encounter: Payer: Self-pay | Admitting: Gastroenterology

## 2018-07-15 ENCOUNTER — Ambulatory Visit (INDEPENDENT_AMBULATORY_CARE_PROVIDER_SITE_OTHER): Payer: 59 | Admitting: Gastroenterology

## 2018-07-15 VITALS — BP 140/72 | HR 72 | Ht 71.0 in | Wt 194.0 lb

## 2018-07-15 DIAGNOSIS — K7460 Unspecified cirrhosis of liver: Secondary | ICD-10-CM

## 2018-07-15 DIAGNOSIS — Z86010 Personal history of colonic polyps: Secondary | ICD-10-CM

## 2018-07-15 DIAGNOSIS — K219 Gastro-esophageal reflux disease without esophagitis: Secondary | ICD-10-CM

## 2018-07-15 DIAGNOSIS — K50818 Crohn's disease of both small and large intestine with other complication: Secondary | ICD-10-CM

## 2018-07-15 DIAGNOSIS — R197 Diarrhea, unspecified: Secondary | ICD-10-CM

## 2018-07-15 LAB — PROTIME-INR
INR: 1.2 ratio — ABNORMAL HIGH (ref 0.8–1.0)
Prothrombin Time: 13.8 s — ABNORMAL HIGH (ref 9.6–13.1)

## 2018-07-15 LAB — LIPASE: Lipase: 5 U/L — ABNORMAL LOW (ref 11.0–59.0)

## 2018-07-15 LAB — AMMONIA: Ammonia: 104 umol/L — ABNORMAL HIGH (ref 11–35)

## 2018-07-15 MED ORDER — MERCAPTOPURINE 50 MG PO TABS: ORAL_TABLET | ORAL | 1 refills | Status: DC

## 2018-07-15 MED ORDER — SUPREP BOWEL PREP KIT 17.5-3.13-1.6 GM/177ML PO SOLN: 1.0000 | ORAL | 0 refills | Status: DC

## 2018-07-15 MED ORDER — ESOMEPRAZOLE MAGNESIUM 40 MG PO CPDR: DELAYED_RELEASE_CAPSULE | ORAL | 1 refills | Status: DC

## 2018-07-15 MED ORDER — METHSCOPOLAMINE BROMIDE 5 MG PO TABS: 5.0000 mg | ORAL_TABLET | Freq: Two times a day (BID) | ORAL | 1 refills | Status: DC

## 2018-07-15 MED ORDER — MESALAMINE 1.2 G PO TBEC: 2.4000 g | DELAYED_RELEASE_TABLET | Freq: Every day | ORAL | 1 refills | Status: DC

## 2018-07-15 MED ORDER — RIFAXIMIN 550 MG PO TABS: 550.0000 mg | ORAL_TABLET | Freq: Two times a day (BID) | ORAL | 1 refills | Status: DC

## 2018-07-15 NOTE — Progress Notes (Signed)
    History of Present Illness: This is a 58 year old male with Crohn's ileocolitis and cirrhosis.  He relates ongoing problems with diarrhea with occasional urgency.  He relates a couple episodes of fecal incontinence when he has experienced a delay getting to the bathroom in a public place. He has intermittent crampy abdominal pain generally associated with diarrhea. This has not changed. CBC, CMP from 1/16 reviewed.   Current Medications, Allergies, Past Medical History, Past Surgical History, Family History and Social History were reviewed in [employer] record.  Physical Exam: General: Well developed, well nourished, no acute distress Head: Normocephalic and atraumatic Eyes:  sclerae anicteric, EOMI Ears: Normal auditory acuity Mouth: No deformity or lesions Lungs: Clear throughout to auscultation Heart: Regular rate and rhythm; no murmurs, rubs or bruits Abdomen: Soft, non tender and non distended. No masses, hepatosplenomegaly or hernias noted. Normal Bowel sounds Rectal: Deferred to colonoscopy  Musculoskeletal: Symmetrical with no gross deformities  Pulses:  Normal pulses noted Extremities: No clubbing, cyanosis, edema or deformities noted Neurological: Alert oriented x 4, grossly non focal. No asterixis.  Psychological:  Alert and cooperative. Normal mood and affect   Assessment and Recommendations:  1. Crohn's ileocolitis. Continue 6MP 100 mg qd, Lialda 2.4 g qd and Pamine forte 5 mg po bid. Frequent diarrhea with a few episodes of incontinence. Imodium 1-2 po tid prn diarrhea. Hold for constipation. Colonoscopy as below. REV in 6 months.   2. Cirrhosis, presumed secondary to NASH. Mild HE appears to be well controlled. Thrombocytopenia. Continue Xifaxan 550 mg po bid. Ammonia, PT/INR, lipase today. REV in 6 months.   3. GERD. Nexium 40 mg po bid. Antireflux measures.   4. Personal history of adenomatous colon polyps due for surveillance colonoscopy.  Schedule colonoscopy. The risks (including bleeding, perforation, infection, missed lesions, medication reactions and possible hospitalization or surgery if complications occur), benefits, and alternatives to colonoscopy with possible biopsy and possible polypectomy were discussed with the patient and they consent to proceed.

## 2018-07-15 NOTE — Patient Instructions (Addendum)
If you are age 57 or older, your body mass index should be between 23-30. Your Body mass index is 27.06 kg/m. If this is out of the aforementioned range listed, please consider follow up with your Primary Care Provider.  If you are age 57 or younger, your body mass index should be between 19-25. Your Body mass index is 27.06 kg/m. If this is out of the aformentioned range listed, please consider follow up with your Primary Care Provider.     You have been scheduled for a colonoscopy. Please follow written instructions given to you at your visit today.  Please pick up your prep supplies at the pharmacy within the next 1-3 days. If you use inhalers (even only as needed), please bring them with you on the day of your procedure. Your physician has requested that you go to www.startemmi.com and enter the access code given to you at your visit today. This web site gives a general overview about your procedure. However, you should still follow specific instructions given to you by our office regarding your preparation for the procedure.  Use over the counter imodium for diarrhea.   Your provider has requested that you go to the basement level for lab work before leaving today. Press "B" on the elevator. The lab is located at the first door on the left as you exit the elevator.   Follow up in 6 months with Dr. Silvio Pate.  Thank you for choosing me and [facility].  Pricilla Riffle. Dagoberto Ligas., MD., Marval Regal

## 2018-07-15 NOTE — Progress Notes (Signed)
Fecal el

## 2018-07-17 ENCOUNTER — Other Ambulatory Visit: Payer: 59

## 2018-07-21 ENCOUNTER — Other Ambulatory Visit: Payer: 59

## 2018-07-21 DIAGNOSIS — R197 Diarrhea, unspecified: Secondary | ICD-10-CM

## 2018-07-24 ENCOUNTER — Encounter: Payer: 59 | Admitting: Gastroenterology

## 2018-07-28 ENCOUNTER — Encounter: Payer: Self-pay | Admitting: Gastroenterology

## 2018-07-28 LAB — GASTROINTESTINAL PATHOGEN PANEL PCR
C. difficile Tox A/B, PCR: UNDETERMINED — AB
CAMPYLOBACTER, PCR: UNDETERMINED — AB
Cryptosporidium, PCR: UNDETERMINED — AB
E COLI 0157, PCR: UNDETERMINED — AB
E coli (ETEC) LT/ST PCR: UNDETERMINED — AB
E coli (STEC) stx1/stx2, PCR: UNDETERMINED — AB
Giardia lamblia, PCR: UNDETERMINED — AB
Norovirus, PCR: UNDETERMINED — AB
Rotavirus A, PCR: UNDETERMINED — AB
Salmonella, PCR: UNDETERMINED — AB
Shigella, PCR: UNDETERMINED — AB

## 2018-07-28 LAB — PANCREATIC ELASTASE, FECAL: Pancreatic Elastase-1, Stool: >500 mcg/g

## 2018-07-29 ENCOUNTER — Other Ambulatory Visit: Payer: Self-pay

## 2018-07-29 DIAGNOSIS — R197 Diarrhea, unspecified: Secondary | ICD-10-CM

## 2018-07-29 NOTE — Progress Notes (Signed)
Gi path

## 2018-08-05 ENCOUNTER — Other Ambulatory Visit: Payer: 59

## 2018-08-05 DIAGNOSIS — R197 Diarrhea, unspecified: Secondary | ICD-10-CM

## 2018-08-07 LAB — GASTROINTESTINAL PATHOGEN PANEL PCR
C. difficile Tox A/B, PCR: NOT DETECTED
Campylobacter, PCR: NOT DETECTED
Cryptosporidium, PCR: NOT DETECTED
E coli (ETEC) LT/ST PCR: NOT DETECTED
E coli (STEC) stx1/stx2, PCR: NOT DETECTED
E coli 0157, PCR: NOT DETECTED
Giardia lamblia, PCR: NOT DETECTED
Norovirus, PCR: NOT DETECTED
ROTAVIRUS, PCR: NOT DETECTED
Salmonella, PCR: NOT DETECTED
Shigella, PCR: NOT DETECTED

## 2018-08-24 NOTE — Progress Notes (Signed)
This encounter was created in error - please disregard.

## 2018-08-28 DIAGNOSIS — Z85828 Personal history of other malignant neoplasm of skin: Secondary | ICD-10-CM | POA: Diagnosis not present

## 2018-08-28 DIAGNOSIS — D2262 Melanocytic nevi of left upper limb, including shoulder: Secondary | ICD-10-CM | POA: Diagnosis not present

## 2018-08-28 DIAGNOSIS — L821 Other seborrheic keratosis: Secondary | ICD-10-CM | POA: Diagnosis not present

## 2018-08-28 DIAGNOSIS — L570 Actinic keratosis: Secondary | ICD-10-CM | POA: Diagnosis not present

## 2018-08-28 DIAGNOSIS — D225 Melanocytic nevi of trunk: Secondary | ICD-10-CM | POA: Diagnosis not present

## 2018-08-28 DIAGNOSIS — D044 Carcinoma in situ of skin of scalp and neck: Secondary | ICD-10-CM | POA: Diagnosis not present

## 2018-08-28 DIAGNOSIS — D0439 Carcinoma in situ of skin of other parts of face: Secondary | ICD-10-CM | POA: Diagnosis not present

## 2018-08-31 ENCOUNTER — Telehealth: Payer: Self-pay | Admitting: *Deleted

## 2018-08-31 ENCOUNTER — Encounter: Payer: Self-pay | Admitting: *Deleted

## 2018-08-31 ENCOUNTER — Other Ambulatory Visit: Payer: Self-pay

## 2018-08-31 ENCOUNTER — Ambulatory Visit (AMBULATORY_SURGERY_CENTER): Payer: Self-pay | Admitting: *Deleted

## 2018-08-31 VITALS — Ht 71.0 in | Wt 188.0 lb

## 2018-08-31 DIAGNOSIS — Z9181 History of falling: Secondary | ICD-10-CM | POA: Insufficient documentation

## 2018-08-31 DIAGNOSIS — Z860100 Personal history of colon polyps, unspecified: Secondary | ICD-10-CM

## 2018-08-31 DIAGNOSIS — K5010 Crohn's disease of large intestine without complications: Secondary | ICD-10-CM

## 2018-08-31 MED ORDER — PEG 3350-KCL-NA BICARB-NACL 420 G PO SOLR: 4000.0000 mL | Freq: Once | ORAL | 0 refills | Status: AC

## 2018-08-31 NOTE — Telephone Encounter (Signed)
Dr Fuller Plan,  This pt I saw in [place] today- his last visit with you was 07-15-2018. He has a Hx of Seizures.   Pt states he has seizures at least 6 days a week- last seizure was yesterday 08-30-2018, Sunday. - On Lamotrigine for Seizures- Pt states he has partial complex seizures but has " no Shaking" - just brain doesn't function per pt. He has a colon scheduled for 3-+23 Monday for a hx of colon polyps and Crohn's. Is it ok to proceed as scheduled ?  Please advise, thanks, Lelan Pons

## 2018-08-31 NOTE — Progress Notes (Signed)
No egg or soy allergy known to patient  No issues with past sedation with any surgeries  or procedures, no intubation problems  No diet pills per patient No home 02 use per patient  No blood thinners per patient  Pt denies issues with constipation  No A fib or A flutter  EMMI video sent to pt's e mail  Pt states he has a Golyt[place] prep at home from an [facility]   Suprep was in the computer but pt states he has the [employer] prep  Pt states he has seizures at least 6 days a week- last seizure was yesterday 08-30-2018, Sunday.    Last OV 07-15-2018 with Fuller Plan- On Lamotrigine for Seizures- Pt states he has partial complex seizures but has " no Shaking" - just brain doesn't function per pt. -  TE to Fuller Plan ok to proceed with 3-23 colon

## 2018-09-06 NOTE — Telephone Encounter (Signed)
Neurology diagnosed pseudoseizures and neuropsych evaluation diagnosed a conversion disorder. This should not interfere with colonoscopy. Please review with CRNA for further input.

## 2018-09-07 NOTE — Telephone Encounter (Signed)
John,  Per DR Fuller Plan, Can you review this about his seizures and clear pt for [facility].  Thanks, Lelan Pons

## 2018-09-08 DIAGNOSIS — Z85828 Personal history of other malignant neoplasm of skin: Secondary | ICD-10-CM | POA: Diagnosis not present

## 2018-09-08 DIAGNOSIS — C44329 Squamous cell carcinoma of skin of other parts of face: Secondary | ICD-10-CM | POA: Diagnosis not present

## 2018-09-09 ENCOUNTER — Encounter (HOSPITAL_COMMUNITY): Payer: Self-pay | Admitting: Emergency Medicine

## 2018-09-09 ENCOUNTER — Other Ambulatory Visit: Payer: Self-pay

## 2018-09-09 ENCOUNTER — Ambulatory Visit (HOSPITAL_COMMUNITY)
Admission: EM | Admit: 2018-09-09 | Discharge: 2018-09-09 | Disposition: A | Discharge status: Home / Self Care | Payer: 59 | Attending: Family Medicine | Admitting: Family Medicine

## 2018-09-09 DIAGNOSIS — R1031 Right lower quadrant pain: Secondary | ICD-10-CM

## 2018-09-09 DIAGNOSIS — R109 Unspecified abdominal pain: Secondary | ICD-10-CM

## 2018-09-09 LAB — POCT URINALYSIS DIP (DEVICE)
Bilirubin Urine: NEGATIVE
Glucose, UA: NEGATIVE mg/dL
Hgb urine dipstick: NEGATIVE
Ketones, ur: NEGATIVE mg/dL
LEUKOCYTE UA: NEGATIVE
Nitrite: NEGATIVE
Protein, ur: NEGATIVE mg/dL
Specific Gravity, Urine: 1.025 (ref 1.005–1.030)
Urobilinogen, UA: 1 mg/dL (ref 0.0–1.0)
pH: 6.5 (ref 5.0–8.0)

## 2018-09-09 NOTE — Discharge Instructions (Signed)
I am not able to feel a hernia today. This is good.  There are no concerning signs for incarcerated hernia. Watch for signs of increased pain, inability to reduce the hernia, redness or discoloration.  If this happens please have it rechecked.  Your urine was negative for infection and no trace of blood.  Follow up as needed for continued or worsening symptoms

## 2018-09-09 NOTE — Telephone Encounter (Signed)
John, Please advise ,Lelan Pons

## 2018-09-09 NOTE — Telephone Encounter (Signed)
[name] [name],  This pt is cleared for anesthetic care at [facility].  Thanks,  [name]

## 2018-09-09 NOTE — Telephone Encounter (Signed)
Dr.  Fuller Plan,  We can do this pt at [facility].  Propofol will increase the seizure threshold so he should be fine under our care.  My concern is his seizures are very frequent.  So his pre- and post-procedure course might be complicated with a seizure requiring significant care and delaying his d/c.  Thanks,  [name]

## 2018-09-09 NOTE — ED Triage Notes (Signed)
Patient lifted a heavy item recently.  Felt something "protrude, and hurts, pain getting worse.." complain of right groin pain

## 2018-09-09 NOTE — ED Provider Notes (Signed)
[name] [name]    CSN: [id number] Arrival date & time: 09/09/18  0459     History   Chief Complaint Chief Complaint  Patient presents with  . Hernia    HPI [name] [name] is a 58 y.o. male.   Is a 58 year old male with past medical history of anxiety, arthritis, cancer, seizures, Crohn's disease, diabetes, esophageal stricture, hemorrhoids, GERD, hyperlipidemia, hypertension, migraines, MRSA. He presents today with possible hernia in the right inguinal area.  This has been coming and going over the past 3 to 4 days.  He has felt protrusion at times but then this resolves.  It is slightly painful after walking 3 to 4 miles.  This all started after doing some heavy lifting.  He also reports that it protrudes at times with straining to urinate and have a bowel movement.  He has had some blood in his urine.  Denies any fevers, chills, bodies, night sweats.  No erythema or discoloration of area.  ROS per HPI      Past Medical History:  Diagnosis Date  . Adenomatous colon polyp 10/1999  . Anxiety   . Arthritis    neck, yoga helps.  . At high risk for falls    due to Seizure disorder  . Cancer ([place])    skin  . Cataract   . Complex partial seizure ([place])    last seizure was 08-30-2018  . Crohn's disease of small and large intestines ([place]) 1999  . Depression   . Diabetes mellitus    no meds at this time 10-24-17  . Esophageal stricture   . External hemorrhoids   . GERD (gastroesophageal reflux disease)   . History of kidney stones    3 large stones still present  . Hyperlipemia   . Hypertension    resolved with weight loss   . Migraines   . MRSA (methicillin resistant Staphylococcus aureus) 10/2010   . Osteoporosis   . PONV (postoperative nausea and vomiting)   . PTSD (post-traumatic stress disorder)   . Renal cyst, acquired, left 07/08/2017  . Renal lesion 05/28/2017  . Seizures ([place])    last seizure was yesterday per pt in [place]- has seizures at [facility] 6 days a week    . Skin cancer    squamous cell multiple; [facility]; followed every 3 months.  . Sleep apnea    CPAP machine, uses nightly  . Staphylococcus aureus bacteremia with sepsis ([place]) 05/28/2017    Patient Active Problem List   Diagnosis Date Noted  . At high risk for falls   . S/P shoulder surgery 03/05/2018  . Excessive daytime sleepiness 02/18/2018  . Crohn's disease of colon with rectal bleeding ([place]) 02/18/2018  . Blood in stool 02/18/2018  . Iron deficiency anemia due to chronic blood loss 02/18/2018  . Iron deficiency anemia due to sideropenic dysphagia 02/18/2018  . Renal calculus, left 02/02/2018  . Fatigue 12/16/2017  . History of sepsis 12/16/2017  . Therapeutic drug monitoring 10/07/2017  . Renal cyst, acquired, left 07/08/2017  . Frequent falls 07/08/2017  . Staphylococcus aureus bacteremia with sepsis ([place]) 05/28/2017  . Renal lesion 05/28/2017  . Bacteremia due to Staphylococcus aureus 05/06/2017  . MSSA bacteremia 05/06/2017  . Confusion 04/02/2017  . Syncope 08/30/2016  . Depression 04/30/2016  . Memory loss 04/30/2016  . OSA on CPAP 10/31/2015  . Nonepileptic episode ([place]) 04/25/2015  . Seizure ([place]) 04/04/2013  . Abnormal EKG 03/12/2013  . DOE (dyspnea on exertion) 03/12/2013  . Complex  partial seizure ([place]) 04/19/2012  . DM2 (diabetes mellitus, type 2) ([place]) 09/25/2011  . Altered mental status 09/25/2011  . OTHER DYSPHAGIA 03/27/2009  . TRANSAMINASES, SERUM, ELEVATED 03/27/2009  . Cough 11/10/2008  . GERD 12/07/2007  . COLONIC POLYPS, ADENOMATOUS, HX OF 12/07/2007  . EXTERNAL HEMORRHOIDS 09/11/2007  . CROHN'S DISEASE, LARGE AND SMALL INTESTINES 09/11/2007  . OSTEOPOROSIS 09/11/2007  . HYPERLIPIDEMIA NEC/NOS 02/27/2007  . Essential hypertension 02/27/2007    Past Surgical History:  Procedure Laterality Date  . CATARACT EXTRACTION Bilateral   . COLONOSCOPY    . ELBOW SURGERY  2012   elbow MRSA infection   . EYE SURGERY     cataracts removed, /w IOL   . INSERTION OF MESH N/A 07/01/2016   Procedure: INSERTION OF MESH;  Surgeon: [name], MD;  Location: [facility];  Service: General;  Laterality: N/A;  . IR URETERAL STENT LEFT NEW ACCESS W/O SEP NEPHROSTOMY CATH  02/02/2018  . NEPHROLITHOTOMY Left 02/02/2018   Procedure: NEPHROLITHOTOMY PERCUTANEOUS;  Surgeon: [name], MD;  Location: [facility];  Service: Urology;  Laterality: Left;  . POLYPECTOMY    . SCALP LACERATION REPAIR Right 10/16/2017   From fall/staples  . SHOULDER ARTHROSCOPY Right 03/05/2018   Procedure: ARTHROSCOPY SHOULDER AND OPEN DISTAL CLAVICLE EXCISION;  Surgeon: [name], MD;  Location: [facility];  Service: Orthopedics;  Laterality: Right;  . SHOULDER SURGERY    . SINUS SURGERY WITH INSTATRAK    . TEE WITHOUT CARDIOVERSION N/A 05/09/2017   Procedure: TRANSESOPHAGEAL ECHOCARDIOGRAM (TEE);  Surgeon: [name], MD;  Location: [facility]COPY;  Service: Cardiovascular;  Laterality: N/A;  . TRANSTHORACIC ECHOCARDIOGRAM  02/22/2011   EF 55-65%; increased pattern of LVH with mild conc hypertrophy, abnormal relaxation & increased filling pressure (grade 2 diastolic dysfunction); atrial septum thickened (lipomatous hypertrophy)  . UMBILICAL HERNIA REPAIR N/A 07/01/2016   Procedure: UMBILICAL HERNIA REPAIR WITH MESH;  Surgeon: [name], MD;  Location: [facility];  Service: General;  Laterality: N/A;  . UPPER GASTROINTESTINAL ENDOSCOPY    . VASECTOMY         Home Medications    Prior to Admission medications   Medication Sig Start Date End Date Taking? Authorizing Provider  ARIPiprazole (ABILIFY) 5 MG tablet TK 1 T PO QHS FOR MOOD 03/10/18   [provider]  Ascorbic Acid (VITAMIN C) 1000 MG tablet Take 1,000 mg by mouth at bedtime.    [provider]  aspirin EC 81 MG tablet Take 81 mg at bedtime by mouth.     [provider]  Calcium Carbonate-Vitamin D (CALCIUM 600+D PO) Take 1 tablet by mouth 2 (two) times daily.    [provider]   cholecalciferol (VITAMIN D) 1000 units tablet Take 1,000 Units at bedtime by mouth.     [provider]  eletriptan (RELPAX) 20 MG tablet Take 1 tablet (20 mg total) by mouth as needed for migraine or headache. May repeat in 2 hours if headache persists or recurs. Patient not taking: Reported on 08/31/2018 04/07/18   Rutherford Guys, MD  esomeprazole (NEXIUM) 40 MG capsule TAKE 1 CAPSULE TWICE A DAY. 07/15/18   Ladene Artist, MD  Eszopiclone 3 MG TABS Take 1 tablet (3 mg total) by mouth at bedtime. Take immediately before bedtime 03/21/17   Wardell Honour, MD  ferrous sulfate 325 (65 FE) MG tablet Take 325 mg at bedtime by mouth.     [provider]  folic acid (FOLVITE) 1 MG tablet Take  2 tablets (2 mg total) by mouth daily. 07/09/18   Rutherford Guys, MD  glucose blood (CHOICE DM FORA G20 TEST STRIPS) test strip Use as instructed 11/13/12   [name], MD  haloperidol (HALDOL) 0.5 MG tablet TK 1 T PO HS FOR MOOD/DELUSION 08/04/18   [provider]  Ibuprofen-diphenhydrAMINE Cit (ADVIL PM PO) Take by mouth.    [provider]  Ibuprofen-diphenhydrAMINE HCl (ADVIL PM) 200-25 MG CAPS Take 1 tablet by mouth at bedtime as needed (sleep).    [provider]  Lactase (DAIRY-RELIEF PO) Take 2 tablets by mouth 2 (two) times daily.     [provider]  lamoTRIgine (LAMICTAL) 150 MG tablet Take 1 tablet (150 mg total) by mouth daily. Patient taking differently: Take 150 mg by mouth 2 (two) times daily.  10/09/17   Dennie Bible, NP  mercaptopurine (PURINETHOL) 50 MG tablet TAKE 2 TABLETS DAILY ON AN EMPTY STOMACH 1 HOUR BEFOREOR 2 HOURS AFTER MEALS     (CAUTION CHEMOTHERAPY) 07/15/18   Ladene Artist, MD  mesalamine (LIALDA) 1.2 g EC tablet Take 2 tablets (2.4 g total) by mouth daily with breakfast. 07/15/18   Ladene Artist, MD  methscopolamine (PAMINE FORTE) 5 MG tablet Take 1 tablet (5 mg total) by mouth 2 (two) times daily. 07/15/18    Ladene Artist, MD  ONE TOUCH ULTRA TEST test strip USE AS INSTRUCTED    Weber, Damaris Hippo, PA-C  potassium chloride SA (K-DUR,KLOR-CON) 20 MEQ tablet Take 1 tablet (20 mEq total) by mouth 2 (two) times daily. 07/09/18   Rutherford Guys, MD  Probiotic Product (PROBIOTIC PO) Take 1 tablet at bedtime by mouth.     [provider]  rifaximin (XIFAXAN) 550 MG TABS tablet Take 1 tablet (550 mg total) by mouth 2 (two) times daily. 07/15/18   Ladene Artist, MD  sertraline (ZOLOFT) 100 MG tablet Take 200 mg daily with breakfast by mouth.  03/16/17   [provider]  SUPREP BOWEL PREP KIT 17.5-3.13-1.6 GM/177ML SOLN Take 1 kit by mouth as directed. For colonoscopy prep 07/15/18   Ladene Artist, MD  traZODone (DESYREL) 100 MG tablet  05/28/18   [provider]  vitamin B-12 (CYANOCOBALAMIN) 500 MCG tablet Take 500 mcg by mouth at bedtime.    [provider]    Family History Family History  Problem Relation Age of Onset  . Colon polyps Father   . Stroke Father   . Hyperlipidemia Father   . Hypertension Father   . Heart disease Mother        CABG at age 63  . Hyperlipidemia Mother   . Hypertension Mother   . Stroke Paternal Grandmother   . Stroke Paternal Grandfather   . Other Child        tetrology of fallot (cornealia deland syndrome)  . Colon cancer Neg Hx   . Esophageal cancer Neg Hx   . Stomach cancer Neg Hx   . Rectal cancer Neg Hx     Social History Social History   Tobacco Use  . Smoking status: Never Smoker  . Smokeless tobacco: Never Used  Substance Use Topics  . Alcohol use: No  . Drug use: No     Allergies   Azithromycin; Beef-derived products; Milk-related compounds; Penicillins; Sulfonamide derivatives; and Dilaudid [hydromorphone hcl]   Review of Systems Review of Systems   Physical Exam Triage Vital Signs ED Triage Vitals  Enc Vitals Group  BP      Pulse      Resp      Temp      Temp src      SpO2      Weight       Height      Head Circumference      Peak Flow      Pain Score      Pain Loc      Pain Edu?      Excl. in [place]?    No data found.  Updated Vital Signs BP 124/74 (BP Location: Left Arm)   Pulse 72   Temp 98.4 F (36.9 C) (Oral)   Resp 16   SpO2 100%   Visual Acuity Right Eye Distance:   Left Eye Distance:   Bilateral Distance:    Right Eye Near:   Left Eye Near:    Bilateral Near:     Physical Exam Vitals signs and nursing note reviewed.  Constitutional:      General: He is not in acute distress.    Appearance: Normal appearance. He is not ill-appearing, toxic-appearing or diaphoretic.  HENT:     Head: Normocephalic and atraumatic.     Nose: Nose normal.  Eyes:     Conjunctiva/sclera: Conjunctivae normal.  Neck:     Musculoskeletal: Normal range of motion.  Pulmonary:     Effort: Pulmonary effort is normal.  Abdominal:     Palpations: Abdomen is soft.     Tenderness: There is abdominal tenderness.     Hernia: No hernia is present.     Comments: Unable to palpate hernia in the right inguinal area No swelling, discoloration.  Musculoskeletal: Normal range of motion.  Skin:    General: Skin is warm and dry.  Neurological:     Mental Status: He is alert.  Psychiatric:        Mood and Affect: Mood normal.      UC Treatments / Results  Labs (all labs ordered are listed, but only abnormal results are displayed) Labs Reviewed  POCT URINALYSIS DIP (DEVICE)    EKG None  Radiology No results found.  Procedures Procedures (including critical care time)  Medications Ordered in UC Medications - No data to display  Initial Impression / Assessment and Plan / UC Course  I have reviewed the triage vital signs and the nursing notes.  Pertinent labs & imaging results that were available during my care of the patient were reviewed by me and considered in my medical decision making (see chart for details).     Pt may have inguinal hernia that has been  reduced Unable to palpate anything at this time.  No concern for incarcerated hernia Instructed patient on warning signs with redness, discoloration, painful to palpation and unable to reduce.  Pt understands and agrees to follow up if this happens.   Urine negative for any abnormality  Final Clinical Impressions(s) / UC Diagnoses   Final diagnoses:  Inguinal pain, right     Discharge Instructions     I am not able to feel a hernia today. This is good.  There are no concerning signs for incarcerated hernia. Watch for signs of increased pain, inability to reduce the hernia, redness or discoloration.  If this happens please have it rechecked.  Your urine was negative for infection and no trace of blood.  Follow up as needed for continued or worsening symptoms     ED Prescriptions    None  Controlled Substance Prescriptions  Controlled Substance Registry consulted? Not Applicable   Orvan July, NP 09/09/18 1035

## 2018-09-09 NOTE — Telephone Encounter (Signed)
Neurology notes indicate that he does not have epileptic seizures. He has conversion disorder, pseudoseizures.

## 2018-09-14 ENCOUNTER — Encounter: Payer: 59 | Admitting: Gastroenterology

## 2018-10-26 ENCOUNTER — Telehealth: Payer: Self-pay | Admitting: *Deleted

## 2018-10-26 NOTE — Telephone Encounter (Signed)
Rescheduled colonoscopy previously cancelled due to Covid-19. Pt states he has prep, does not need Rx resent. Prep instructions redone and mailed to patient.

## 2018-10-28 DIAGNOSIS — M25511 Pain in right shoulder: Secondary | ICD-10-CM | POA: Diagnosis not present

## 2018-11-09 DIAGNOSIS — M25511 Pain in right shoulder: Secondary | ICD-10-CM | POA: Diagnosis not present

## 2018-11-15 ENCOUNTER — Telehealth: Payer: Self-pay | Admitting: *Deleted

## 2018-11-15 NOTE — Telephone Encounter (Signed)
Covid-19 travel screening questions  Have you traveled in the last 14 days? No If yes where?  Do you now or have you had a fever in the last 14 days? No  Do you have any respiratory symptoms of shortness of breath or cough now or in the last 14 days? No  Do you have any family members or close contacts with diagnosed or suspected Covid-19? No  Care partner aware to stay in car in parking lot and patient to wear mask into the building. Pt was given instructions for Golytely but had Suprep medication. Reviewed instructions for how to complete Suprep prep.

## 2018-11-17 ENCOUNTER — Other Ambulatory Visit: Payer: Self-pay

## 2018-11-17 ENCOUNTER — Ambulatory Visit (AMBULATORY_SURGERY_CENTER): Payer: 59 | Admitting: Gastroenterology

## 2018-11-17 ENCOUNTER — Encounter: Payer: Self-pay | Admitting: Gastroenterology

## 2018-11-17 VITALS — BP 136/78 | HR 62 | Temp 99.3°F | Resp 19 | Ht 71.0 in | Wt 194.0 lb

## 2018-11-17 DIAGNOSIS — E119 Type 2 diabetes mellitus without complications: Secondary | ICD-10-CM | POA: Diagnosis not present

## 2018-11-17 DIAGNOSIS — D124 Benign neoplasm of descending colon: Secondary | ICD-10-CM | POA: Diagnosis not present

## 2018-11-17 DIAGNOSIS — I10 Essential (primary) hypertension: Secondary | ICD-10-CM | POA: Diagnosis not present

## 2018-11-17 DIAGNOSIS — Z86010 Personal history of colonic polyps: Secondary | ICD-10-CM | POA: Diagnosis not present

## 2018-11-17 DIAGNOSIS — K50818 Crohn's disease of both small and large intestine with other complication: Secondary | ICD-10-CM | POA: Diagnosis not present

## 2018-11-17 DIAGNOSIS — D122 Benign neoplasm of ascending colon: Secondary | ICD-10-CM

## 2018-11-17 DIAGNOSIS — G4733 Obstructive sleep apnea (adult) (pediatric): Secondary | ICD-10-CM | POA: Diagnosis not present

## 2018-11-17 DIAGNOSIS — K5090 Crohn's disease, unspecified, without complications: Secondary | ICD-10-CM | POA: Diagnosis not present

## 2018-11-17 MED ORDER — SODIUM CHLORIDE 0.9 % IV SOLN: 500.0000 mL | Freq: Once | INTRAVENOUS | Status: DC

## 2018-11-17 NOTE — Progress Notes (Signed)
Pt's states no medical or surgical changes since previsit or office visit. 

## 2018-11-17 NOTE — Progress Notes (Signed)
[place] , [place], [place]

## 2018-11-17 NOTE — Op Note (Signed)
[facility] Patient Name: [name] [name] Procedure Date: 11/17/2018 9:30 AM MRN: [id number] Endoscopist: Ladene Artist , MD Age: 59 Referring MD:  Date of Birth: 10-07-1960 Gender: Male Account #: [id number] Procedure:                Colonoscopy Indications:              Surveillance: Personal history of adenomatous                            polyps on last colonoscopy 5 years ago, High risk                            colon cancer surveillance: Crohn's ileocolitis of 8                            (or more) years duration with one-third (or more)                            of the colon involved Medicines:                Monitored Anesthesia Care Procedure:                Pre-Anesthesia Assessment:                           - Prior to the procedure, a History and Physical                            was performed, and patient medications and                            allergies were reviewed. The patient's tolerance of                            previous anesthesia was also reviewed. The risks                            and benefits of the procedure and the sedation                            options and risks were discussed with the patient.                            All questions were answered, and informed consent                            was obtained. Prior Anticoagulants: The patient has                            taken no previous anticoagulant or antiplatelet                            agents. ASA Grade Assessment: III - A patient with  severe systemic disease. After reviewing the risks                            and benefits, the patient was deemed in                            satisfactory condition to undergo the procedure.                           After obtaining informed consent, the colonoscope                            was passed under direct vision. Throughout the                            procedure, the patient's blood pressure,  pulse, and                            oxygen saturations were monitored continuously. The                            Colonoscope was introduced through the anus and                            advanced to the the terminal ileum, with                            identification of the appendiceal orifice and IC                            valve. The terminal ileum, ileocecal valve,                            appendiceal orifice, and rectum were photographed.                            The quality of the bowel preparation was adequate.                            The colonoscopy was performed without difficulty.                            The patient tolerated the procedure well. Scope In: 9:39:24 AM Scope Out: 10:03:44 AM Scope Withdrawal Time: 0 hours 20 minutes 51 seconds  Total Procedure Duration: 0 hours 24 minutes 20 seconds  Findings:                 The perianal and digital rectal examinations were                            normal.                           The terminal ileum appeared normal. Biopsies were  taken with a cold forceps for histology.                           Three sessile polyps were found in the descending                            colon (2) and ascending colon (1). The polyps were                            6 to 7 mm in size. These polyps were removed with a                            cold snare. Resection and retrieval were complete.                           Internal hemorrhoids were found during                            retroflexion. The hemorrhoids were small and Grade                            I (internal hemorrhoids that do not prolapse).                           A few small-mouthed diverticula were found in the                            left colon. There was no evidence of diverticular                            bleeding.                           The exam was otherwise normal throughout the                            examined  colon. Random biopsies obtained throughout                            with mild oozing noted at several sites that                            stopped spontaneously. Complications:            No immediate complications. Estimated blood loss:                            None. Estimated Blood Loss:     Estimated blood loss: none. Impression:               - The examined portion of the ileum was normal.                            Biopsied.                           -  Three 6 to 7 mm polyps in the descending colon                            and in the ascending colon, removed with a cold                            snare. Resected and retrieved.                           - Internal hemorrhoids.                           - Mild diverticulosis in the left colon. There was                            no evidence of diverticular bleeding. Recommendation:           - Repeat colonoscopy in 5 years for surveillance.                           - Patient has a contact number available for                            emergencies. The signs and symptoms of potential                            delayed complications were discussed with the                            patient. Return to normal activities tomorrow.                            Written discharge instructions were provided to the                            patient.                           - Resume previous diet.                           - Continue present medications.                           - Await pathology results.                           - No aspirin, ibuprofen, naproxen, or other                            non-steroidal anti-inflammatory drugs for 1 week                            after biopsies and polyp removal. Ladene Artist, MD 11/17/2018 10:24:12 AM This report has been signed electronically.

## 2018-11-17 NOTE — Progress Notes (Signed)
Report given to PACU, vss 

## 2018-11-17 NOTE — Progress Notes (Signed)
Called to room to assist during endoscopic procedure.  Patient ID and intended procedure confirmed with present staff. Received instructions for my participation in the procedure from the performing physician.  

## 2018-11-17 NOTE — Patient Instructions (Addendum)
Information on polyps, diverticulosis and hemorrhoids given to you today.  Await pathology results.  No ibuprofen, aspirin, naproxen or other non steroidal anti inflammatory medications for 1 week after biopsies and polyp removal.    YOU HAD AN ENDOSCOPIC PROCEDURE TODAY AT [facility]:   Refer to the procedure report that was given to you for any specific questions about what was found during the examination.  If the procedure report does not answer your questions, please call your gastroenterologist to clarify.  If you requested that your care partner not be given the details of your procedure findings, then the procedure report has been included in a sealed envelope for you to review at your convenience later.  YOU SHOULD EXPECT: Some feelings of bloating in the abdomen. Passage of more gas than usual.  Walking can help get rid of the air that was put into your GI tract during the procedure and reduce the bloating. If you had a lower endoscopy (such as a colonoscopy or flexible sigmoidoscopy) you may notice spotting of blood in your stool or on the toilet paper. If you underwent a bowel prep for your procedure, you may not have a normal bowel movement for a few days.  Please Note:  You might notice some irritation and congestion in your nose or some drainage.  This is from the oxygen used during your procedure.  There is no need for concern and it should clear up in a day or so.  SYMPTOMS TO REPORT IMMEDIATELY:   Following lower endoscopy (colonoscopy or flexible sigmoidoscopy):  Excessive amounts of blood in the stool  Significant tenderness or worsening of abdominal pains  Swelling of the abdomen that is new, acute  Fever of 100F or higher  For urgent or emergent issues, a gastroenterologist can be reached at any hour by calling [phone].   DIET:  We do recommend a small meal at first, but then you may proceed to your regular diet.  Drink plenty of fluids but  you should avoid alcoholic beverages for 24 hours.  ACTIVITY:  You should plan to take it easy for the rest of today and you should NOT DRIVE or use heavy machinery until tomorrow (because of the sedation medicines used during the test).    FOLLOW UP: Our staff will call the number listed on your records 48-72 hours following your procedure to check on you and address any questions or concerns that you may have regarding the information given to you following your procedure. If we do not reach you, we will leave a message.  We will attempt to reach you two times.  During this call, we will ask if you have developed any symptoms of COVID 19. If you develop any symptoms (ie: fever, flu-like symptoms, shortness of breath, cough etc.) before then, please call [phone].  If you test positive for Covid 19 in the 2 weeks post procedure, please call and report this information to [place].    If any biopsies were taken you will be contacted by phone or by letter within the next 1-3 weeks.  Please call us at [phone] if you have not heard about the biopsies in 3 weeks.    SIGNATURES/CONFIDENTIALITY: You and/or your care partner have signed paperwork which will be entered into your electronic medical record.  These signatures attest to the fact that that the information above on your After Visit Summary has been reviewed and is understood.  Full responsibility of the confidentiality of  this discharge information lies with you and/or your care-partner.

## 2018-11-19 ENCOUNTER — Telehealth: Payer: Self-pay

## 2018-11-19 NOTE — Telephone Encounter (Signed)
  Follow up Call-  Call back number 11/17/2018 10/24/2017  Post procedure Call Back phone  # [phone] wife [phone]  Permission to leave phone message Yes Yes  Some recent data might be hidden     Patient questions:  Do you have a fever, pain , or abdominal swelling? No. Pain Score  0 *  Have you tolerated food without any problems? Yes.    Have you been able to return to your normal activities? Yes.    Do you have any questions about your discharge instructions: Diet   No. Medications  No. Follow up visit  No.  Do you have questions or concerns about your Care? No.  Actions: * If pain score is 4 or above: No action needed, pain <4.  1. Have you developed a fever since your procedure? no  2.   Have you had an respiratory symptoms (SOB or cough) since your procedure? no  3.   Have you tested positive for COVID 19 since your procedure no  4.   Have you had any family members/close contacts diagnosed with the COVID 19 since your procedure?  no   If any of these questions are a yes, please inquire if patient has been seen by family doctor and route this note to Joylene John, Therapist, sports.

## 2018-11-26 ENCOUNTER — Encounter: Payer: Self-pay | Admitting: Gastroenterology

## 2018-12-23 DIAGNOSIS — F458 Other somatoform disorders: Secondary | ICD-10-CM | POA: Diagnosis not present

## 2018-12-23 DIAGNOSIS — M26601 Right temporomandibular joint disorder, unspecified: Secondary | ICD-10-CM | POA: Diagnosis not present

## 2018-12-23 DIAGNOSIS — M26631 Articular disc disorder of right temporomandibular joint: Secondary | ICD-10-CM | POA: Diagnosis not present

## 2018-12-28 ENCOUNTER — Encounter: Payer: Self-pay | Admitting: Family Medicine

## 2018-12-28 ENCOUNTER — Other Ambulatory Visit: Payer: Self-pay

## 2018-12-28 ENCOUNTER — Ambulatory Visit (INDEPENDENT_AMBULATORY_CARE_PROVIDER_SITE_OTHER): Payer: 59 | Admitting: Family Medicine

## 2018-12-28 VITALS — BP 128/80 | HR 76 | Temp 98.6°F | Ht 71.0 in | Wt 191.4 lb

## 2018-12-28 DIAGNOSIS — G4733 Obstructive sleep apnea (adult) (pediatric): Secondary | ICD-10-CM | POA: Diagnosis not present

## 2018-12-28 DIAGNOSIS — G40209 Localization-related (focal) (partial) symptomatic epilepsy and epileptic syndromes with complex partial seizures, not intractable, without status epilepticus: Secondary | ICD-10-CM | POA: Diagnosis not present

## 2018-12-28 DIAGNOSIS — Z9989 Dependence on other enabling machines and devices: Secondary | ICD-10-CM | POA: Diagnosis not present

## 2018-12-28 NOTE — Progress Notes (Signed)
PATIENT: [name] [name] DOB: 1961-06-11  REASON FOR VISIT: follow up HISTORY FROM: patient  Chief Complaint  Patient presents with   Follow-up    Yearly f/u. Alone. Rm 2. No new concerns at this time.      HISTORY OF PRESENT ILLNESS: Today 12/29/18 [name] [name] is a 58 y.o. male here today for follow up of OSA on CPAP and seizure like episodes (followed by [name] Krista Blue).   Compliance report dated 11/23/2021 12/23/2018 reveals that he is using CPAP 30 out of the last 30 days for compliance 100%.  He is using CPAP greater than 4 hours 29 out of the last 30 days for compliance 97%.  AHI was 2.9 on 6 to 16 cm of water and EPR of 3.  There was a mild leak noted in the 95th percentile of 24.2. He is in need of new supplies.   He continues to have 2-3 seizure like events per week with confusion, dizziness, anger, headaches and memory loss. He is taking Lamictal 155m twice daily as prescribed. Dose was increased in 2019 but he does not feel this has helped. He has been on multiple antiepileptic medications in the past with no relief of seizure like events. He is seeing psychiatry for high stress levels, depression and potential conversion disorder. He is seen regularly. He denies urinary incontinence, LOC or biting of tongue. He does not drive.    HISTORY: (copied from [name] Dohmeier's note on 02/18/2018)  HISTORY FROM: Patient here alone for CPAP compliance. [name] [name], 58year old caucasian male is seen here on 02-18-2018, he is now disabled used to work in [employer], technical sales He has OSA and used CPAP for years . He has been suffering a right shoulder and scapula  injury when his dog caused him to fall, surgery pending with [name]. DRhona [name] He has been diagnosed with 9 kidney stones, these were removed surgically. He used a couple of days pain killers, was give #40 of percocet, but used sparingly.  He has a family history of addiction, brother and uncle affected.  The patient underwent a home sleep test on apnea link on 30 Oct 2017, goal was to replace his by that time 57year-old machine.  The home sleep test confirmed an AHI of 32, RDI of 34.2/h, 32 minutes of oxygen desaturation under 89%.  The patient qualified with these results of course for new CPAP.  It was an auto titration capable machine with a pressure window between 6 and 16 cmH2O and 3 cm EPR mask of his choice.  He has used the machine compliantly for 93% of the last 30 days, 6 hours and 9 minutes of his average use of time, the residual AHI is 5.1 dated 02-15-18.  Pressure at the 95th percentile is 12 cmH2O air leaks are mild and have actually improved over the last 14 days.  He has tightened the headgear at that time he reports. Uses a FFM-   He remains depressed , sees a therapist.  He remains excessively daytime sleepy ; his current Epworth sleepiness score is endorsed at 21/24 points, fatigue severity at 30 / 63 points.  His residual AHI of 5.1/h can hardly explain the degree of excessive daytime sleepiness - I wonder if it Crohn's , recovery from kidney surgery,  The severe depression or other metabolic or illness related factors.   [name] YRhea Beltoninterval visit :He had a past medical history of diabetes, hypertension, Crohn's disease, on immunosuppressive treatment, He presented with seizure-like  event, reported initial event was June 28 2014, he was found by his wife to difficulty to be awake and eyes closed, took a while for him to open his eyes, and become very confused, also angry, episodes last about 5-10 minutes, there was also witnessed leg extension, jerking movement, He had multiple recurrent confusion episodes later, MRI of the brain, MRA of the brain in April 2013 was normal,EEG showed intermittent left anterior and medial temporal lobe slowing  Interval history from 03/11/2017, I have the pleasure of seeing [name] [name] once a year for compliance visit and his CPAP compliance is excellent. He is used to machine 97% of the time over 4 hours 100% of  the last 30 days, his average user time of 6 hours 27 minutes the machine is set at 10 cm pressure this 2 cm expiratory pressure relief. His residual AHI is 2.8 but he does have high air leaks. These however does not did not distort the apnea count. He also feels that without this CPAP he cannot sleep as well. His fatigue questionnaire was endorsed high at 47 points, his Epworth sleepiness score was endorsed at 21 points out of 24 possible points, also very high. He continues to feel depressed, and I was able to review [name]. Lynelle Smoke report which stated that the patient's major depression was interfering with some of the validity of cognitive testing. She felt that there was room for conversion disorder based on the patient's own feeling of worthlessness, hopelessness and sadness. [name]. Conchita [name] reports that he had a seizure last night in spite of being compliant with his seizure medications.    REVIEW OF SYSTEMS: Out of a complete 14 system review of symptoms, the patient complains only of the following symptoms, and all other reviewed systems are negative.  ALLERGIES: Allergies  Allergen Reactions   Azithromycin Swelling    SWELLING REACTION UNSPECIFIED    Beef-Derived Products Diarrhea    RED MEAT > UNSPECIFIED REACTION    Milk-Related Compounds Diarrhea    DAIRY >UNSPECIFIED REACTION    Penicillins Hives    Rash as child- tolerates Ancef/CHILDHOOD ALLERGY Has patient had a PCN reaction causing immediate rash, facial/tongue/throat swelling, SOB or lightheadedness with hypotension: YES Has patient had a PCN reaction causing severe rash involving mucus membranes or skin necrosis: Unknown Has patient had a PCN reaction that required hospitalization: Unknown Has patient had a PCN reaction occurring within the last 10 years:Unknown If all of the above answers are "NO", then may proceed with Cephalosporin [place]   Sulfonamide Derivatives Hives   Dilaudid [Hydromorphone Hcl] Nausea And Vomiting     HOME MEDICATIONS: Outpatient Medications Prior to Visit  Medication Sig Dispense Refill   ARIPiprazole (ABILIFY) 5 MG tablet TK 1 T PO QHS FOR MOOD  0   Ascorbic Acid (VITAMIN C) 1000 MG tablet Take 1,000 mg by mouth at bedtime.     aspirin EC 81 MG tablet Take 81 mg at bedtime by mouth.      Calcium Carbonate-Vitamin D (CALCIUM 600+D PO) Take 1 tablet by mouth 2 (two) times daily.     cholecalciferol (VITAMIN D) 1000 units tablet Take 1,000 Units at bedtime by mouth.      eletriptan (RELPAX) 20 MG tablet Take 1 tablet (20 mg total) by mouth as needed for migraine or headache. May repeat in 2 hours if headache persists or recurs. 10 tablet 0   esomeprazole (NEXIUM) 40 MG capsule TAKE 1 CAPSULE TWICE A DAY. 180 capsule 1  Eszopiclone 3 MG TABS Take 1 tablet (3 mg total) by mouth at bedtime. Take immediately before bedtime 30 tablet 1   ferrous sulfate 325 (65 FE) MG tablet Take 325 mg at bedtime by mouth.      folic acid (FOLVITE) 1 MG tablet Take 2 tablets (2 mg total) by mouth daily. 180 tablet 3   glucose blood (CHOICE DM FORA G20 TEST STRIPS) test strip Use as instructed 100 each 12   haloperidol (HALDOL) 0.5 MG tablet TK 1 T PO HS FOR MOOD/DELUSION     Ibuprofen-diphenhydrAMINE Cit (ADVIL PM PO) Take by mouth.     Ibuprofen-diphenhydrAMINE HCl (ADVIL PM) 200-25 MG CAPS Take 1 tablet by mouth at bedtime as needed (sleep).     Lactase (DAIRY-RELIEF PO) Take 2 tablets by mouth 2 (two) times daily.      lamoTRIgine (LAMICTAL) 150 MG tablet Take 1 tablet (150 mg total) by mouth daily. (Patient taking differently: Take 150 mg by mouth 2 (two) times daily. ) 180 tablet 3   mercaptopurine (PURINETHOL) 50 MG tablet TAKE 2 TABLETS DAILY ON AN EMPTY STOMACH 1 HOUR BEFOREOR 2 HOURS AFTER MEALS     (CAUTION CHEMOTHERAPY) 180 tablet 1   mesalamine (LIALDA) 1.2 g EC tablet Take 2 tablets (2.4 g total) by mouth daily with breakfast. 180 tablet 1   ONE TOUCH ULTRA TEST test strip  USE AS INSTRUCTED 100 each 2   potassium chloride SA (K-DUR,KLOR-CON) 20 MEQ tablet Take 1 tablet (20 mEq total) by mouth 2 (two) times daily. 180 tablet 3   Probiotic Product (PROBIOTIC PO) Take 1 tablet at bedtime by mouth.      rifaximin (XIFAXAN) 550 MG TABS tablet Take 1 tablet (550 mg total) by mouth 2 (two) times daily. 180 tablet 1   sertraline (ZOLOFT) 100 MG tablet Take 200 mg daily with breakfast by mouth.      traZODone (DESYREL) 100 MG tablet      vitamin B-12 (CYANOCOBALAMIN) 500 MCG tablet Take 500 mcg by mouth at bedtime.     methscopolamine (PAMINE FORTE) 5 MG tablet Take 1 tablet (5 mg total) by mouth 2 (two) times daily. (Patient not taking: Reported on 12/28/2018) 180 tablet 1   Facility-Administered Medications Prior to Visit  Medication Dose Route Frequency Provider Last Rate Last Dose   gadopentetate dimeglumine (MAGNEVIST) injection 20 mL  20 mL Intravenous Once PRN Marcial Pacas, MD        PAST MEDICAL HISTORY: Past Medical History:  Diagnosis Date   Adenomatous colon polyp 10/1999   Anxiety    Arthritis    neck, yoga helps.   At high risk for falls    due to Seizure disorder   Cancer [facility])    skin   Cataract    Complex partial seizure ([place])    last seizure was 08-30-2018   Crohn's disease of small and large intestines ([place]) 1999   Depression    Diabetes mellitus    no meds at this time 10-24-17   Esophageal stricture    External hemorrhoids    GERD (gastroesophageal reflux disease)    History of kidney stones    3 large stones still present   Hyperlipemia    Hypertension    resolved with weight loss    Migraines    MRSA (methicillin resistant Staphylococcus aureus) 10/2010    Osteoporosis    PONV (postoperative nausea and vomiting)    PTSD (post-traumatic stress disorder)    Renal cyst, acquired,  left 07/08/2017   Renal lesion 05/28/2017   Seizures ([place])    last seizure was yesterday per pt in [place]- has seizures at [place] 6  days a week    Skin cancer    squamous cell multiple; [facility]; followed every 3 months.   Sleep apnea    CPAP machine, uses nightly   Staphylococcus aureus bacteremia with sepsis ([place]) 05/28/2017    PAST SURGICAL HISTORY: Past Surgical History:  Procedure Laterality Date   CATARACT EXTRACTION Bilateral    COLONOSCOPY     ELBOW SURGERY  2012   elbow MRSA infection    EYE SURGERY     cataracts removed, /w IOL   INSERTION OF MESH N/A 07/01/2016   Procedure: INSERTION OF MESH;  Surgeon: [name], MD;  Location: [place];  Service: General;  Laterality: N/A;   IR URETERAL STENT LEFT NEW ACCESS W/O SEP NEPHROSTOMY CATH  02/02/2018   NEPHROLITHOTOMY Left 02/02/2018   Procedure: NEPHROLITHOTOMY PERCUTANEOUS;  Surgeon: [name], MD;  Location: [facility];  Service: Urology;  Laterality: Left;   POLYPECTOMY     SCALP LACERATION REPAIR Right 10/16/2017   From fall/staples   SHOULDER ARTHROSCOPY Right 03/05/2018   Procedure: ARTHROSCOPY SHOULDER AND OPEN DISTAL CLAVICLE EXCISION;  Surgeon: [name], MD;  Location: [place];  Service: Orthopedics;  Laterality: Right;   SHOULDER SURGERY     SINUS SURGERY WITH INSTATRAK     TEE WITHOUT CARDIOVERSION N/A 05/09/2017   Procedure: TRANSESOPHAGEAL ECHOCARDIOGRAM (TEE);  Surgeon: [name], MD;  Location: [facility] END[facility]COPY;  Service: Cardiovascular;  Laterality: N/A;   TRANSTHORACIC ECHOCARDIOGRAM  02/22/2011   EF 55-65%; increased pattern of LVH with mild conc hypertrophy, abnormal relaxation & increased filling pressure (grade 2 diastolic dysfunction); atrial septum thickened (lipomatous hypertrophy)   UMBILICAL HERNIA REPAIR N/A 07/01/2016   Procedure: UMBILICAL HERNIA REPAIR WITH MESH;  Surgeon: [name], MD;  Location: [place];  Service: General;  Laterality: N/A;   UPPER GASTROINTESTINAL ENDOSCOPY     VASECTOMY      FAMILY HISTORY: Family History  Problem Relation Age of Onset   Colon polyps Father    Stroke  Father    Hyperlipidemia Father    Hypertension Father    Heart disease Mother        CABG at age 33   Hyperlipidemia Mother    Hypertension Mother    Stroke Paternal Grandmother    Stroke Paternal Grandfather    Other Child        tetrology of fallot (cornealia deland syndrome)   Colon cancer Neg Hx    Esophageal cancer Neg Hx    Stomach cancer Neg Hx    Rectal cancer Neg Hx     SOCIAL HISTORY: Social History   Socioeconomic History   Marital status: Married    Spouse name: [name]   Number of children: 2   Years of education: college   Highest education level: Not on file  Occupational History   Occupation: disabled    Comment: seizure disorder with memory impairment  Social Designer, fashion/clothing strain: Not on file   Food insecurity    Worry: Not on file    Inability: Not on file   Transportation needs    Medical: Not on file    Non-medical: Not on file  Tobacco Use   Smoking status: Never Smoker   Smokeless tobacco: Never Used  Substance and Sexual Activity   Alcohol use: No   Drug use: No  Sexual activity: Yes    Birth control/protection: Surgical    Comment: 1 partner in last 12 months  Lifestyle   Physical activity    Days per week: Not on file    Minutes per session: Not on file   Stress: Not on file  Relationships   Social connections    Talks on phone: Not on file    Gets together: Not on file    Attends religious service: Not on file    Active member of club or organization: Not on file    Attends meetings of clubs or organizations: Not on file    Relationship status: Not on file   Intimate partner violence    Fear of current or ex partner: Not on file    Emotionally abused: Not on file    Physically abused: Not on file    Forced sexual activity: Not on file  Other Topics Concern   Not on file  Social History Narrative   Marital status: married x 32 years;       Lives:   lives at home with his family.       Children:  two children; no grandchildren.  Son is special needs at age 67yo; Cornelian Delaine with TOF.       Employment: unemployed; [employer] long term disability      Tobacco: none      Alcohol: none      Drugs: none      Exercise: unable to exercise due to frequent falls   Patient has a college education.   Patient is right-handed.   Patient drinks one cup of soda daily.      PHYSICAL EXAM  Vitals:   12/28/18 1257  BP: 128/80  Pulse: 76  Temp: 98.6 F (37 C)  TempSrc: Oral  Weight: 191 lb 6.4 oz (86.8 kg)  Height: 5' 11"  (1.803 m)   Body mass index is 26.69 kg/m.  Generalized: Well developed, in no acute distress  Cardiology: normal rate and rhythm, no murmur noted Neurological examination  Mentation: Alert oriented to time, place, history taking. Follows all commands speech and language fluent Cranial nerve II-XII: Pupils were equal round reactive to light. Extraocular movements were full, visual field were full on confrontational test. Facial sensation and strength were normal. Uvula tongue midline. Head turning and shoulder shrug  were normal and symmetric. Motor: The motor testing reveals 5 over 5 strength of all 4 extremities. Good symmetric motor tone is noted throughout.  Coordination: Cerebellar testing reveals good finger-nose-finger and heel-to-shin bilaterally.  Gait and station: Gait is normal.  DIAGNOSTIC DATA (LABS, IMAGING, TESTING) - I reviewed patient records, labs, notes, testing and imaging myself where available.  MMSE - Mini Mental State Exam 10/07/2017 04/02/2017 04/30/2016  Orientation to time 2 2 3   Orientation to Place 4 5 3   Registration 3 3 3   Attention/ Calculation 2 5 3   Recall 3 1 2   Language- name 2 objects 2 2 2   Language- repeat 1 1 1   Language- follow 3 step command 2 3 3   Language- read & follow direction 1 1 1   Write a sentence 1 1 1   Copy design 0 1 1  Total score 21 25 23      Lab Results  Component Value Date    WBC 3.6 12/28/2018   HGB 11.4 (L) 12/28/2018   HCT 30.7 (L) 12/28/2018   MCV 101 (H) 12/28/2018   PLT 87 (LL) 12/28/2018  Component Value Date/Time   NA 144 12/28/2018 1350   K 4.0 12/28/2018 1350   CL 109 (H) 12/28/2018 1350   CO2 21 12/28/2018 1350   GLUCOSE 79 12/28/2018 1350   GLUCOSE 103 (H) 03/05/2018 1211   BUN 17 12/28/2018 1350   CREATININE 1.13 12/28/2018 1350   CREATININE 0.84 05/15/2016 1005   CALCIUM 9.0 12/28/2018 1350   PROT 6.6 12/28/2018 1350   ALBUMIN 4.5 12/28/2018 1350   AST 32 12/28/2018 1350   ALT 18 12/28/2018 1350   ALKPHOS 107 12/28/2018 1350   BILITOT 3.1 (H) 12/28/2018 1350   GFRNONAA 72 12/28/2018 1350   GFRNONAA >89 06/03/2015 0858   GFRAA 83 12/28/2018 1350   GFRAA >89 06/03/2015 0858   Lab Results  Component Value Date   CHOL 169 07/09/2018   HDL 80 07/09/2018   LDLCALC 69 07/09/2018   TRIG 99 07/09/2018   CHOLHDL 2.1 07/09/2018   Lab Results  Component Value Date   HGBA1C 4.4 (L) 07/09/2018   Lab Results  Component Value Date   VITAMINB12 >2000 (H) 06/04/2017   Lab Results  Component Value Date   TSH 2.57 08/27/2017       ASSESSMENT AND PLAN 58 y.o. year old male  has a past medical history of Adenomatous colon polyp (10/1999), Anxiety, Arthritis, At high risk for falls, Cancer ([place]), Cataract, Complex partial seizure ([place]), Crohn's disease of small and large intestines ([place]) (1999), Depression, Diabetes mellitus, Esophageal stricture, External hemorrhoids, GERD (gastroesophageal reflux disease), History of kidney stones, Hyperlipemia, Hypertension, Migraines, MRSA (methicillin resistant Staphylococcus aureus) (10/2010 ), Osteoporosis, PONV (postoperative nausea and vomiting), PTSD (post-traumatic stress disorder), Renal cyst, acquired, left (07/08/2017), Renal lesion (05/28/2017), Seizures ([place]), Skin cancer, Sleep apnea, and Staphylococcus aureus bacteremia with sepsis ([place]) (05/28/2017). here with     ICD-10-CM   1. OSA on  CPAP  G47.33 For home use only DME continuous positive airway pressure (CPAP)   Z99.89   2. Partial symptomatic epilepsy with complex partial seizures, not intractable, without status epilepticus (HCC)  G40.209 Lamotrigine level    CMP    CBC with Differential/Platelets    He is doing well with CPAP therapy. Compliance data reveals excellent compliance. He was encouraged to continue using CPAP nightly and for greater than 4 hours each night. We will order new supplies. He was advised to call if he notes continues leak. After discussion with [name] Krista Blue, we will continue Lamictal 126m twice daily as prescribed. I am concerned that seizure like events may be related to psychiatric concerns. We will update labs to ensure compliance with therapy. He was advised to not drive. We will follow up in 6 months, sooner if needed.   Orders Placed This Encounter  Procedures   For home use only DME continuous positive airway pressure (CPAP)    New supplies please    Order Specific Question:   Length of Need    Answer:   Lifetime    Order Specific Question:   Patient has OSA or probable OSA    Answer:   Yes    Order Specific Question:   Is the patient currently using CPAP in the home    Answer:   Yes    Order Specific Question:   Settings    Answer:   Other see comments    Order Specific Question:   CPAP supplies needed    Answer:   Mask, headgear, cushions, filters, heated tubing and water chamber   Lamotrigine level  CMP   CBC with Differential/Platelets     No orders of the defined types were placed in this encounter.     I spent 15 minutes with the patient. 50% of this time was spent counseling and educating patient on plan of care and medications.    Debbora Presto, FNP-C 12/29/2018, 11:51 AM [facility] [address] [place], [place] [address] [phone]

## 2018-12-28 NOTE — Patient Instructions (Addendum)
Continue current treatment with lamotrigine 17m twice daily, Continue CPAP nightly and for greater than 4 hours each night  Follow up in 6 months  No driving    Seizure, Adult A seizure is a sudden burst of abnormal electrical activity in the brain. Seizures usually last from 30 seconds to 2 minutes. They can cause many different symptoms. Usually, seizures are not harmful unless they last a long time. What are the causes? Common causes of this condition include:  Fever or infection.  Conditions that affect the brain, such as: ? A brain abnormality that you were born with. ? A brain or head injury. ? Bleeding in the brain. ? A tumor. ? Stroke. ? Brain disorders such as autism or cerebral palsy.  Low blood sugar.  Conditions that are passed from parent to child (are inherited).  Problems with substances, such as: ? Having a reaction to a drug or a medicine. ? Suddenly stopping the use of a substance (withdrawal). In some cases, the cause may not be known. A person who has repeated seizures over time without a clear cause has a condition called epilepsy. What increases the risk? You are more likely to get this condition if you have:  A family history of epilepsy.  Had a seizure in the past.  A brain disorder.  A history of head injury, lack of oxygen at birth, or strokes. What are the signs or symptoms? There are many types of seizures. The symptoms vary depending on the type of seizure you have. Examples of symptoms during a seizure include:  Shaking (convulsions).  Stiffness in the body.  Passing out (losing consciousness).  Head nodding.  Staring.  Not responding to sound or touch.  Loss of bladder control and bowel control. Some people have symptoms right before and right after a seizure happens. Symptoms before a seizure may include:  Fear.  Worry (anxiety).  Feeling like you may vomit (nauseous).  Feeling like the room is spinning (vertigo).   Feeling like you saw or heard something before (dj vu).  Odd tastes or smells.  Changes in how you see. You may see flashing lights or spots. Symptoms after a seizure happens can include:  Confusion.  Sleepiness.  Headache.  Weakness on one side of the body. How is this treated? Most seizures will stop on their own in under 5 minutes. In these cases, no treatment is needed. Seizures that last longer than 5 minutes will usually need treatment. Treatment can include:  Medicines given through an IV tube.  Avoiding things that are known to cause your seizures. These can include medicines that you take for another condition.  Medicines to treat epilepsy.  Surgery to stop the seizures. This may be needed if medicines do not help. Follow these instructions at home: Medicines  Take over-the-counter and prescription medicines only as told by your doctor.  Do not eat or drink anything that may keep your medicine from working, such as alcohol. Activity  Do not do any activities that would be dangerous if you had another seizure, like driving or swimming. Wait until your doctor says it is safe for you to do them.  If you live in the [place] ask your local DMV (department of motor vehicles) when you can drive.  Get plenty of rest. Teaching others Teach friends and family what to do when you have a seizure. They should:  Lay you on the ground.  Protect your head and body.  Loosen any tight clothing around  your neck.  Turn you on your side.  Not hold you down.  Not put anything into your mouth.  Know whether or not you need emergency care.  Stay with you until you are better.  General instructions  Contact your doctor each time you have a seizure.  Avoid anything that gives you seizures.  Keep a seizure diary. Write down: ? What you think caused each seizure. ? What you remember about each seizure.  Keep all follow-up visits as told by your doctor. This is  important. Contact a doctor if:  You have another seizure.  You have seizures more often.  There is any change in what happens during your seizures.  You keep having seizures with treatment.  You have symptoms of being sick or having an infection. Get help right away if:  You have a seizure that: ? Lasts longer than 5 minutes. ? Is different than seizures you had before. ? Makes it harder to breathe. ? Happens after you hurt your head.  You have any of these symptoms after a seizure: ? Not being able to speak. ? Not being able to use a part of your body. ? Confusion. ? A bad headache.  You have two or more seizures in a row.  You do not wake up right after a seizure.  You get hurt during a seizure. These symptoms may be an emergency. Do not wait to see if the symptoms will go away. Get medical help right away. Call your local emergency services (911 in the [place] Do not drive yourself to the hospital. Summary  Seizures usually last from 30 seconds to 2 minutes. Usually, they are not harmful unless they last a long time.  Do not eat or drink anything that may keep your medicine from working, such as alcohol.  Teach friends and family what to do when you have a seizure.  Contact your doctor each time you have a seizure. This information is not intended to replace advice given to you by your health care provider. Make sure you discuss any questions you have with your health care provider. Document Released: 11/27/2007 Document Revised: 08/28/2018 Document Reviewed: 08/28/2018 Elsevier Patient Education  [place].

## 2018-12-29 ENCOUNTER — Encounter: Payer: Self-pay | Admitting: Family Medicine

## 2018-12-30 ENCOUNTER — Telehealth: Payer: Self-pay | Admitting: *Deleted

## 2018-12-30 LAB — CBC WITH DIFFERENTIAL/PLATELET
Basophils Absolute: 0 10*3/uL (ref 0.0–0.2)
Basos: 1 %
EOS (ABSOLUTE): 0.1 10*3/uL (ref 0.0–0.4)
Eos: 3 %
Hematocrit: 30.7 % — ABNORMAL LOW (ref 37.5–51.0)
Hemoglobin: 11.4 g/dL — ABNORMAL LOW (ref 13.0–17.7)
Immature Grans (Abs): 0 10*3/uL (ref 0.0–0.1)
Immature Granulocytes: 1 %
Lymphocytes Absolute: 0.6 10*3/uL — ABNORMAL LOW (ref 0.7–3.1)
Lymphs: 17 %
MCH: 37.5 pg — ABNORMAL HIGH (ref 26.6–33.0)
MCHC: 37.1 g/dL — ABNORMAL HIGH (ref 31.5–35.7)
MCV: 101 fL — ABNORMAL HIGH (ref 79–97)
Monocytes Absolute: 0.2 10*3/uL (ref 0.1–0.9)
Monocytes: 7 %
Neutrophils Absolute: 2.6 10*3/uL (ref 1.4–7.0)
Neutrophils: 71 %
Platelets: 87 10*3/uL — CL (ref 150–450)
RBC: 3.04 x10E6/uL — ABNORMAL LOW (ref 4.14–5.80)
RDW: 14.1 % (ref 11.6–15.4)
WBC: 3.6 10*3/uL (ref 3.4–10.8)

## 2018-12-30 LAB — COMPREHENSIVE METABOLIC PANEL
ALT: 18 IU/L (ref 0–44)
AST: 32 IU/L (ref 0–40)
Albumin/Globulin Ratio: 2.1 (ref 1.2–2.2)
Albumin: 4.5 g/dL (ref 3.8–4.9)
Alkaline Phosphatase: 107 IU/L (ref 39–117)
BUN/Creatinine Ratio: 15 (ref 9–20)
BUN: 17 mg/dL (ref 6–24)
Bilirubin Total: 3.1 mg/dL — ABNORMAL HIGH (ref 0.0–1.2)
CO2: 21 mmol/L (ref 20–29)
Calcium: 9 mg/dL (ref 8.7–10.2)
Chloride: 109 mmol/L — ABNORMAL HIGH (ref 96–106)
Creatinine, Ser: 1.13 mg/dL (ref 0.76–1.27)
GFR calc Af Amer: 83 mL/min/{1.73_m2} (ref >59)
GFR calc non Af Amer: 72 mL/min/{1.73_m2} (ref >59)
Globulin, Total: 2.1 g/dL (ref 1.5–4.5)
Glucose: 79 mg/dL (ref 65–99)
Potassium: 4 mmol/L (ref 3.5–5.2)
Sodium: 144 mmol/L (ref 134–144)
Total Protein: 6.6 g/dL (ref 6.0–8.5)

## 2018-12-30 LAB — LAMOTRIGINE LEVEL: Lamotrigine Lvl: 11 ug/mL (ref 2.0–20.0)

## 2018-12-30 NOTE — Telephone Encounter (Signed)
-----   Message from Debbora Presto, NP sent at 12/30/2018  8:00 AM EDT ----- Please let him know that his labs are back and seizure medication levels are good. He does have some abnormalities on his blood counts and his bilirubin is elevated. It appears that this has been a baseline for him for some time but I would like for him to follow up with PCP closely. Please ask him to schedule appt with PCP if he has not been seen recently.

## 2018-12-30 NOTE — Telephone Encounter (Signed)
LMVM for pts wife that have lab results back on [place].  Have him call or I will email on mychart too.

## 2019-01-04 NOTE — Progress Notes (Signed)
I have reviewed and agreed above plan. 

## 2019-01-08 ENCOUNTER — Encounter: Payer: Self-pay | Admitting: Family Medicine

## 2019-01-08 ENCOUNTER — Ambulatory Visit (INDEPENDENT_AMBULATORY_CARE_PROVIDER_SITE_OTHER): Payer: 59 | Admitting: Family Medicine

## 2019-01-08 ENCOUNTER — Telehealth: Payer: Self-pay

## 2019-01-08 ENCOUNTER — Other Ambulatory Visit: Payer: Self-pay

## 2019-01-08 VITALS — BP 118/72 | HR 84 | Temp 98.5°F | Ht 71.0 in | Wt 185.6 lb

## 2019-01-08 DIAGNOSIS — E876 Hypokalemia: Secondary | ICD-10-CM

## 2019-01-08 DIAGNOSIS — K7469 Other cirrhosis of liver: Secondary | ICD-10-CM

## 2019-01-08 DIAGNOSIS — K5080 Crohn's disease of both small and large intestine without complications: Secondary | ICD-10-CM | POA: Diagnosis not present

## 2019-01-08 DIAGNOSIS — E119 Type 2 diabetes mellitus without complications: Secondary | ICD-10-CM

## 2019-01-08 DIAGNOSIS — I10 Essential (primary) hypertension: Secondary | ICD-10-CM

## 2019-01-08 DIAGNOSIS — D539 Nutritional anemia, unspecified: Secondary | ICD-10-CM

## 2019-01-08 DIAGNOSIS — R296 Repeated falls: Secondary | ICD-10-CM

## 2019-01-08 DIAGNOSIS — F321 Major depressive disorder, single episode, moderate: Secondary | ICD-10-CM

## 2019-01-08 MED ORDER — POTASSIUM CHLORIDE CRYS ER 20 MEQ PO TBCR: 20.0000 meq | EXTENDED_RELEASE_TABLET | Freq: Two times a day (BID) | ORAL | 3 refills | Status: DC

## 2019-01-08 MED ORDER — FOLIC ACID 1 MG PO TABS: 2.0000 mg | ORAL_TABLET | Freq: Every day | ORAL | 3 refills | Status: DC

## 2019-01-08 NOTE — Telephone Encounter (Signed)
Pt brought in [place] handicapped plaqued to be filled by doctor, copy was made and sent to scan

## 2019-01-08 NOTE — Patient Instructions (Signed)
° ° ° °  If you have lab work done today you will be contacted with your lab results within the next 2 weeks.  If you have not heard from us then please contact us. The fastest way to get your results is to register for My Chart. ° ° °IF you received an x-ray today, you will receive an invoice from [facility]. Please contact [facility] at [phone] with questions or concerns regarding your invoice.  ° °IF you received labwork today, you will receive an invoice from [facility]. Please contact [facility] at [phone] with questions or concerns regarding your invoice.  ° °Our billing staff will not be able to assist you with questions regarding bills from these companies. ° °You will be contacted with the lab results as soon as they are available. The fastest way to get your results is to activate your My Chart account. Instructions are located on the last page of this paperwork. If you have not heard from us regarding the results in 2 weeks, please contact this office. °  ° ° ° °

## 2019-01-08 NOTE — Progress Notes (Signed)
7/17/202010:50 AM  [name] [name] Jul 23, 1960, 58 y.o., male [id number]  Chief Complaint  Patient presents with  . Follow-up    no longer taking bp and dm meds, walks daily to deal with his depression and see's therapist    HPI:   Patient is a 58 y.o. male with past medical history significant for DM2, HTN, chrons disease with cirrhosis, anxiety, depression, seizures who presents today for routine followup  Last OV Jan 2020  Continues to have falls, negative workup Uses cane Requesting renewal of disability placard  Recently saw oral surgeon, had steroid injection into right TMJ  Sees psych for depression, Dr Darleene Cleaver Still doing therapy  Diabetes is diet controlled  Has lost almost 100 lbs Walks 5 miles a day  GI, Dr Fuller Plan, for chrons disease Patient reports that his chrons is stable, stools are usually bright red blood mushy Had colonoscopy in May 2020 Takes vitamin J67, folic acid, potassium and iron supplements  Recent labs done by neurology for seizures and OSA on cpap CBC and CMP abnormalities noted  RUQ [place] 2018 - minimal cirrhoiss Patient does not drink Workup in 2018 done by GI - negative  Lab Results  Component Value Date   WBC 3.6 12/28/2018   HGB 11.4 (L) 12/28/2018   HCT 30.7 (L) 12/28/2018   MCV 101 (H) 12/28/2018   PLT 87 (LL) 12/28/2018   Lab Results  Component Value Date   CREATININE 1.13 12/28/2018   BUN 17 12/28/2018   NA 144 12/28/2018   K 4.0 12/28/2018   CL 109 (H) 12/28/2018   CO2 21 12/28/2018   Lab Results  Component Value Date   ALT 18 12/28/2018   AST 32 12/28/2018   ALKPHOS 107 12/28/2018   BILITOT 3.1 (H) 12/28/2018    Lab Results  Component Value Date   HGBA1C 4.4 (L) 07/09/2018   HGBA1C 4.0 (L) 01/26/2018   HGBA1C 4.7 10/27/2017   Lab Results  Component Value Date   MICROALBUR 7.4 05/15/2016   LDLCALC 69 07/09/2018   CREATININE 1.13 12/28/2018    Depression screen PHQ 2/9 07/09/2018 04/07/2018 01/05/2018   Decreased Interest 0 0 0  Down, Depressed, Hopeless 0 0 0  PHQ - 2 Score 0 0 0  Altered sleeping - - -  Tired, decreased energy - - -  Change in appetite - - -  Feeling bad or failure about yourself  - - -  Trouble concentrating - - -  Moving slowly or fidgety/restless - - -  Suicidal thoughts - - -  PHQ-9 Score - - -  Difficult doing work/chores - - -  Some recent data might be hidden    Fall Risk  01/08/2019 07/09/2018 04/07/2018 02/18/2018 01/05/2018  Falls in the past year? 1 0 No Yes Yes  Number falls in past yr: 1 - - 1 1  Injury with Fall? 1 - - Yes No  Comment - - - - -  Risk Factor Category  - - - High Fall Risk -  Risk for fall due to : - - - - -  Follow up - - - Education provided -     Allergies  Allergen Reactions  . Azithromycin Swelling    SWELLING REACTION UNSPECIFIED   . Beef-Derived Products Diarrhea    RED MEAT > UNSPECIFIED REACTION   . Milk-Related Compounds Diarrhea    DAIRY >UNSPECIFIED REACTION   . Penicillins Hives    Rash as child- tolerates Ancef/CHILDHOOD ALLERGY Has patient  had a PCN reaction causing immediate rash, facial/tongue/throat swelling, SOB or lightheadedness with hypotension: YES Has patient had a PCN reaction causing severe rash involving mucus membranes or skin necrosis: Unknown Has patient had a PCN reaction that required hospitalization: Unknown Has patient had a PCN reaction occurring within the last 10 years:Unknown If all of the above answers are "NO", then may proceed with Cephalosporin [place]  . Sulfonamide Derivatives Hives  . Dilaudid [Hydromorphone Hcl] Nausea And Vomiting    Prior to Admission medications   Medication Sig Start Date End Date Taking? Authorizing Provider  ARIPiprazole (ABILIFY) 5 MG tablet TK 1 T PO QHS FOR MOOD 03/10/18  Yes [provider]  Ascorbic Acid (VITAMIN C) 1000 MG tablet Take 1,000 mg by mouth at bedtime.   Yes [provider]  aspirin EC 81 MG tablet Take 81 mg at bedtime by  mouth.    Yes [provider]  Calcium Carbonate-Vitamin D (CALCIUM 600+D PO) Take 1 tablet by mouth 2 (two) times daily.   Yes [provider]  cholecalciferol (VITAMIN D) 1000 units tablet Take 1,000 Units at bedtime by mouth.    Yes [provider]  eletriptan (RELPAX) 20 MG tablet Take 1 tablet (20 mg total) by mouth as needed for migraine or headache. May repeat in 2 hours if headache persists or recurs. 04/07/18  Yes Rutherford Guys, MD  esomeprazole (NEXIUM) 40 MG capsule TAKE 1 CAPSULE TWICE A DAY. 07/15/18  Yes Ladene Artist, MD  ferrous sulfate 325 (65 FE) MG tablet Take 325 mg at bedtime by mouth.    Yes [provider]  glucose blood (CHOICE DM FORA G20 TEST STRIPS) test strip Use as instructed 11/13/12  Yes [name], MD  haloperidol (HALDOL) 0.5 MG tablet TK 1 T PO HS FOR MOOD/DELUSION 08/04/18  Yes [provider]  lamoTRIgine (LAMICTAL) 150 MG tablet Take 1 tablet (150 mg total) by mouth daily. Patient taking differently: Take 150 mg by mouth 2 (two) times daily.  10/09/17  Yes Dennie Bible, NP  mercaptopurine (PURINETHOL) 50 MG tablet TAKE 2 TABLETS DAILY ON AN EMPTY STOMACH 1 HOUR BEFOREOR 2 HOURS AFTER MEALS     (CAUTION CHEMOTHERAPY) 07/15/18  Yes Ladene Artist, MD  mesalamine (LIALDA) 1.2 g EC tablet Take 2 tablets (2.4 g total) by mouth daily with breakfast. 07/15/18  Yes Ladene Artist, MD  methscopolamine (PAMINE FORTE) 5 MG tablet Take 1 tablet (5 mg total) by mouth 2 (two) times daily. 07/15/18  Yes Ladene Artist, MD  ONE TOUCH ULTRA TEST test strip USE AS INSTRUCTED   Yes Weber, Sarah L, PA-C  potassium chloride SA (K-DUR,KLOR-CON) 20 MEQ tablet Take 1 tablet (20 mEq total) by mouth 2 (two) times daily. 07/09/18  Yes Rutherford Guys, MD  Probiotic Product (PROBIOTIC PO) Take 1 tablet at bedtime by mouth.    Yes [provider]  sertraline (ZOLOFT) 100 MG tablet Take 200 mg daily with breakfast by  mouth.  03/16/17  Yes [provider]  traZODone (DESYREL) 100 MG tablet  05/28/18  Yes [provider]  vitamin B-12 (CYANOCOBALAMIN) 500 MCG tablet Take 500 mcg by mouth at bedtime.   Yes [provider]    Past Medical History:  Diagnosis Date  . Adenomatous colon polyp 10/1999  . Anxiety   . Arthritis    neck, yoga helps.  . At high risk for falls    due to Seizure disorder  .  Cancer ([place])    skin  . Cataract   . Complex partial seizure ([place])    last seizure was 08-30-2018  . Crohn's disease of small and large intestines ([place]) 1999  . Depression   . Diabetes mellitus    no meds at this time 10-24-17  . Esophageal stricture   . External hemorrhoids   . GERD (gastroesophageal reflux disease)   . History of kidney stones    3 large stones still present  . Hyperlipemia   . Hypertension    resolved with weight loss   . Migraines   . MRSA (methicillin resistant Staphylococcus aureus) 10/2010   . Osteoporosis   . PONV (postoperative nausea and vomiting)   . PTSD (post-traumatic stress disorder)   . Renal cyst, acquired, left 07/08/2017  . Renal lesion 05/28/2017  . Seizures ([place])    last seizure was yesterday per pt in [place]- has seizures at [place] 6 days a week   . Skin cancer    squamous cell multiple; [facility]; followed every 3 months.  . Sleep apnea    CPAP machine, uses nightly  . Staphylococcus aureus bacteremia with sepsis ([place]) 05/28/2017    Past Surgical History:  Procedure Laterality Date  . CATARACT EXTRACTION Bilateral   . COLONOSCOPY    . ELBOW SURGERY  2012   elbow MRSA infection   . EYE SURGERY     cataracts removed, /w IOL  . INSERTION OF MESH N/A 07/01/2016   Procedure: INSERTION OF MESH;  Surgeon: [name], MD;  Location: [facility];  Service: General;  Laterality: N/A;  . IR URETERAL STENT LEFT NEW ACCESS W/O SEP NEPHROSTOMY CATH  02/02/2018  . NEPHROLITHOTOMY Left 02/02/2018   Procedure: NEPHROLITHOTOMY PERCUTANEOUS;  Surgeon:  [name], MD;  Location: [facility];  Service: Urology;  Laterality: Left;  . POLYPECTOMY    . SCALP LACERATION REPAIR Right 10/16/2017   From fall/staples  . SHOULDER ARTHROSCOPY Right 03/05/2018   Procedure: ARTHROSCOPY SHOULDER AND OPEN DISTAL CLAVICLE EXCISION;  Surgeon: [name], MD;  Location: [facility];  Service: Orthopedics;  Laterality: Right;  . SHOULDER SURGERY    . SINUS SURGERY WITH INSTATRAK    . TEE WITHOUT CARDIOVERSION N/A 05/09/2017   Procedure: TRANSESOPHAGEAL ECHOCARDIOGRAM (TEE);  Surgeon: [name], MD;  Location[facility];  Service: Cardiovascular;  Laterality: N/A;  . TRANSTHORACIC ECHOCARDIOGRAM  02/22/2011   EF 55-65%; increased pattern of LVH with mild conc hypertrophy, abnormal relaxation & increased filling pressure (grade 2 diastolic dysfunction); atrial septum thickened (lipomatous hypertrophy)  . UMBILICAL HERNIA REPAIR N/A 07/01/2016   Procedure: UMBILICAL HERNIA REPAIR WITH MESH;  Surgeon: [name], MD;  Location: [facility];  Service: General;  Laterality: N/A;  . UPPER GASTROINTESTINAL ENDOSCOPY    . VASECTOMY      Social History   Tobacco Use  . Smoking status: Never Smoker  . Smokeless tobacco: Never Used  Substance Use Topics  . Alcohol use: No    Family History  Problem Relation Age of Onset  . Colon polyps Father   . Stroke Father   . Hyperlipidemia Father   . Hypertension Father   . Heart disease Mother        CABG at age 57  . Hyperlipidemia Mother   . Hypertension Mother   . Stroke Paternal Grandmother   . Stroke Paternal Grandfather   . Other Child        tetrology of fallot (cornealia deland syndrome)  . Colon cancer Neg  Hx   . Esophageal cancer Neg Hx   . Stomach cancer Neg Hx   . Rectal cancer Neg Hx     Review of Systems  Constitutional: Negative for chills and fever.  Respiratory: Negative for cough and shortness of breath.   Cardiovascular: Negative for chest pain, palpitations and leg swelling.  per  hpi   OBJECTIVE:  Today's Vitals   01/08/19 1021  BP: 118/72  Pulse: 84  Temp: 98.5 F (36.9 C)  TempSrc: Oral  SpO2: 98%  Weight: 185 lb 9.6 oz (84.2 kg)  Height: 5' 11"  (1.803 m)   Body mass index is 25.89 kg/m.  Wt Readings from Last 3 Encounters:  01/08/19 185 lb 9.6 oz (84.2 kg)  12/28/18 191 lb 6.4 oz (86.8 kg)  11/17/18 194 lb (88 kg)    Physical Exam Vitals signs and nursing note reviewed.  Constitutional:      Appearance: He is well-developed.  HENT:     Head: Normocephalic and atraumatic.  Eyes:     General: No scleral icterus.    Conjunctiva/sclera: Conjunctivae normal.     Pupils: Pupils are equal, round, and reactive to light.  Neck:     Musculoskeletal: Neck supple.  Cardiovascular:     Rate and Rhythm: Normal rate and regular rhythm.     Pulses: Normal pulses.     Heart sounds: No murmur. No friction rub. No gallop.   Pulmonary:     Effort: Pulmonary effort is normal.     Breath sounds: Normal breath sounds. No wheezing or rales.  Musculoskeletal:     Right lower leg: No edema.     Left lower leg: No edema.  Skin:    General: Skin is warm and dry.     Coloration: Skin is not jaundiced.  Neurological:     Mental Status: He is alert and oriented to person, place, and time.     Gait: Gait abnormal.       ASSESSMENT and PLAN  1. Essential hypertension  2. Type 2 diabetes mellitus without complication, without long-term current use of insulin (HCC) - Microalbumin / creatinine urine ratio - Ambulatory referral to Ophthalmology - Hemoglobin A1c  3. Crohn's disease of both small and large intestine without complication (HCC) - Iron, TIBC and Ferritin Panel  4. Other cirrhosis of liver ([place])  5. Current moderate episode of major depressive disorder without prior episode ([place])  6. Anemia, macrocytic - Vitamin V94 - Folate - folic acid (FOLVITE) 1 MG tablet; Take 2 tablets (2 mg total) by mouth daily.  7. Hypokalemia - potassium  chloride SA (K-DUR) 20 MEQ tablet; Take 1 tablet (20 mEq total) by mouth 2 (two) times daily.  8. Frequent falls  Overall patient is stable. Checking iron, vitamin b12 and folate acid levels. Cont with psych and GI. Cont with LFM. Fall prevention reviewed. Continue to use cane. Disability parking placard renewed.   Return in about 6 months (around 07/11/2019).    Rutherford Guys, MD Primary Care at [place] [place], [place] [address] Ph.  [phone] Fax [phone]

## 2019-01-09 LAB — IRON,TIBC AND FERRITIN PANEL
Ferritin: 778 ng/mL — ABNORMAL HIGH (ref 30–400)
Iron Saturation: 53 % (ref 15–55)
Iron: 149 ug/dL (ref 38–169)
Total Iron Binding Capacity: 279 ug/dL (ref 250–450)
UIBC: 130 ug/dL (ref 111–343)

## 2019-01-09 LAB — CMP14+EGFR
ALT: 16 IU/L (ref 0–44)
AST: 29 IU/L (ref 0–40)
Albumin/Globulin Ratio: 2.2 (ref 1.2–2.2)
Albumin: 4.6 g/dL (ref 3.8–4.9)
Alkaline Phosphatase: 162 IU/L — ABNORMAL HIGH (ref 39–117)
BUN/Creatinine Ratio: 15 (ref 9–20)
BUN: 13 mg/dL (ref 6–24)
Bilirubin Total: 1.8 mg/dL — ABNORMAL HIGH (ref 0.0–1.2)
CO2: 21 mmol/L (ref 20–29)
Calcium: 9.4 mg/dL (ref 8.7–10.2)
Chloride: 109 mmol/L — ABNORMAL HIGH (ref 96–106)
Creatinine, Ser: 0.88 mg/dL (ref 0.76–1.27)
GFR calc Af Amer: 110 mL/min/{1.73_m2} (ref >59)
GFR calc non Af Amer: 95 mL/min/{1.73_m2} (ref >59)
Globulin, Total: 2.1 g/dL (ref 1.5–4.5)
Glucose: 95 mg/dL (ref 65–99)
Potassium: 4.1 mmol/L (ref 3.5–5.2)
Sodium: 143 mmol/L (ref 134–144)
Total Protein: 6.7 g/dL (ref 6.0–8.5)

## 2019-01-09 LAB — HEMOGLOBIN A1C
Est. average glucose Bld gHb Est-mCnc: 85 mg/dL
Hgb A1c MFr Bld: 4.6 % — ABNORMAL LOW (ref 4.8–5.6)

## 2019-01-09 LAB — FOLATE: Folate: >20 ng/mL (ref >3.0)

## 2019-01-09 LAB — MICROALBUMIN / CREATININE URINE RATIO
Creatinine, Urine: 156.3 mg/dL
Microalb/Creat Ratio: 36 mg/g creat — ABNORMAL HIGH (ref 0–29)
Microalbumin, Urine: 57 ug/mL

## 2019-01-09 LAB — VITAMIN B12: Vitamin B-12: 1733 pg/mL — ABNORMAL HIGH (ref 232–1245)

## 2019-01-17 ENCOUNTER — Other Ambulatory Visit: Payer: Self-pay | Admitting: Gastroenterology

## 2019-01-20 ENCOUNTER — Other Ambulatory Visit: Payer: Self-pay | Admitting: Family Medicine

## 2019-01-20 MED ORDER — LAMOTRIGINE 150 MG PO TABS: 150.0000 mg | ORAL_TABLET | Freq: Two times a day (BID) | ORAL | 1 refills | Status: DC

## 2019-01-20 NOTE — Telephone Encounter (Signed)
Lamictal refilled x 6 months with sig  per NP's last note; refilled to [facility] mail service as requested.

## 2019-01-20 NOTE — Telephone Encounter (Signed)
Pt is requesting a refill of lamoTRIgine (LAMICTAL) 150 MG tablet, to be sent to [place], [place] to [facility] [facility]

## 2019-01-26 ENCOUNTER — Telehealth: Payer: Self-pay | Admitting: Family Medicine

## 2019-01-26 NOTE — Telephone Encounter (Signed)
This is a Total Disability form to continue is his disability. It is still in the provider box in the nurses station.   Does the pt have to come back to have a physical in order to complete this form or are you about to use the information form his last office visit from 01/08/19?  Thanks

## 2019-01-26 NOTE — Telephone Encounter (Signed)
Pt dropped off NON FMLA paperwork. Pt would like to pick up forms when completed. Paperwork has been placed in providers box at nurses station.

## 2019-01-27 ENCOUNTER — Telehealth: Payer: Self-pay

## 2019-01-27 NOTE — Telephone Encounter (Signed)
Paperwork put in provider box for completion, copy at nurse station.

## 2019-01-27 NOTE — Telephone Encounter (Signed)
Called pt back and updated FMLA- Disability pprwrk. Pt needs to sign and pay $15 fee. Pt made appt in case Dr. Pamella Pert needs to follow up.

## 2019-01-28 DIAGNOSIS — M26631 Articular disc disorder of right temporomandibular joint: Secondary | ICD-10-CM | POA: Diagnosis not present

## 2019-02-01 ENCOUNTER — Telehealth (INDEPENDENT_AMBULATORY_CARE_PROVIDER_SITE_OTHER): Payer: 59 | Admitting: Family Medicine

## 2019-02-01 DIAGNOSIS — K50111 Crohn's disease of large intestine with rectal bleeding: Secondary | ICD-10-CM | POA: Diagnosis not present

## 2019-02-01 DIAGNOSIS — F321 Major depressive disorder, single episode, moderate: Secondary | ICD-10-CM

## 2019-02-01 DIAGNOSIS — G40209 Localization-related (focal) (partial) symptomatic epilepsy and epileptic syndromes with complex partial seizures, not intractable, without status epilepticus: Secondary | ICD-10-CM | POA: Diagnosis not present

## 2019-02-01 DIAGNOSIS — R296 Repeated falls: Secondary | ICD-10-CM

## 2019-02-01 NOTE — Progress Notes (Signed)
Pt is needing the forms filled out for long term disability.

## 2019-02-01 NOTE — Progress Notes (Signed)
Virtual Visit Note  I connected with patient on 02/01/19 at 656pm by phone and verified that I am speaking with the correct person using two identifiers. [name] [name] is currently located at home and patient is currently with them during visit. The provider, Rutherford Guys, MD is located in their office at time of visit.  I discussed the limitations, risks, security and privacy concerns of performing an evaluation and management service by telephone and the availability of in person appointments. I also discussed with the patient that there may be a patient responsible charge related to this service. The patient expressed understanding and agreed to proceed.   CC: Forms for long term disability  HPI   Patient is a 58 y.o. male with past medical history significant for DM2, HTN, chrons disease with cirrhosis, anxiety, depression, seizures, OSA on cpapwho presents today for disability forms  On disability since 2016 for recurrent falls, seizures Used to work in IT Recurrent falls, about 5-10 a week, most recent fall was down stairs yesterday, uses cane Has about 3 seizures a month and sometimes has during sleep,  Has post-ictal state often Unable to stand or walk for more than 10-15 minutes at a time without getting really dizzy and risking a fall He does not drive per neurology  He is right handed and has had sign decrease in strength and ROM since he fractured his clavicle in 2019 He drops objects (cups, plates, etc) on a regular basis He uses wireless keyboard sitting on the floor Reports worsening cognition, 2 days ago could not remember his dogs name.  Overall patient's health condition remains stable, without any significant improvement  Patient Care Team: Rutherford Guys, MD as PCP - General (Family Medicine) Kennith Center, RD as Dietitian (Family Medicine) Wilfred Curtis, [facility] as Counselor (Psychology) Corena Pilgrim, MD as Consulting Physician (Psychiatry) [name], MD as Consulting Physician (Urology) Ladene Artist, MD as Consulting Physician (Gastroenterology) Dohmeier, Asencion Partridge, MD as Consulting Physician (Neurology)   ?   Allergies  Allergen Reactions  . Azithromycin Swelling    SWELLING REACTION UNSPECIFIED   . Beef-Derived Products Diarrhea    RED MEAT > UNSPECIFIED REACTION   . Milk-Related Compounds Diarrhea    DAIRY >UNSPECIFIED REACTION   . Penicillins Hives    Rash as child- tolerates Ancef/CHILDHOOD ALLERGY Has patient had a PCN reaction causing immediate rash, facial/tongue/throat swelling, SOB or lightheadedness with hypotension: YES Has patient had a PCN reaction causing severe rash involving mucus membranes or skin necrosis: Unknown Has patient had a PCN reaction that required hospitalization: Unknown Has patient had a PCN reaction occurring within the last 10 years:Unknown If all of the above answers are "NO", then may proceed with Cephalosporin [place]  . Sulfonamide Derivatives Hives  . Dilaudid [Hydromorphone Hcl] Nausea And Vomiting    Prior to Admission medications   Medication Sig Start Date End Date Taking? Authorizing Provider  ARIPiprazole (ABILIFY) 5 MG tablet TK 1 T PO QHS FOR MOOD 03/10/18  Yes [provider]  Ascorbic Acid (VITAMIN C) 1000 MG tablet Take 1,000 mg by mouth at bedtime.   Yes [provider]  aspirin EC 81 MG tablet Take 81 mg at bedtime by mouth.    Yes [provider]  Calcium Carbonate-Vitamin D (CALCIUM 600+D PO) Take 1 tablet by mouth 2 (two) times daily.   Yes [provider]  cholecalciferol (VITAMIN D) 1000 units tablet Take 1,000 Units at bedtime by mouth.  Yes [provider]  eletriptan (RELPAX) 20 MG tablet Take 1 tablet (20 mg total) by mouth as needed for migraine or headache. May repeat in 2 hours if headache persists or recurs. 04/07/18  Yes Rutherford Guys, MD  esomeprazole (NEXIUM) 40 MG capsule TAKE 1 CAPSULE TWICE DAILY 01/18/19   Yes Ladene Artist, MD  ferrous sulfate 325 (65 FE) MG tablet Take 325 mg at bedtime by mouth.    Yes [provider]  folic acid (FOLVITE) 1 MG tablet Take 2 tablets (2 mg total) by mouth daily. 01/08/19  Yes Rutherford Guys, MD  glucose blood (CHOICE DM FORA G20 TEST STRIPS) test strip Use as instructed 11/13/12  Yes [name], MD  haloperidol (HALDOL) 0.5 MG tablet TK 1 T PO HS FOR MOOD/DELUSION 08/04/18  Yes [provider]  lamoTRIgine (LAMICTAL) 150 MG tablet Take 1 tablet (150 mg total) by mouth 2 (two) times daily. 01/20/19  Yes Marcial Pacas, MD  mercaptopurine (PURINETHOL) 50 MG tablet TAKE 2 TABLETS (100MG) DAILY ON AN EMPTY STOMACH 1 HOUR BEFORE OR 2 HOURS AFTER MEAL (CAUTION CHEMOTHERAPY) 01/18/19  Yes Ladene Artist, MD  mesalamine (LIALDA) 1.2 g EC tablet TAKE 2 TABLETS (2.4GM)     DAILY WITH BREAKFAST 01/18/19  Yes Ladene Artist, MD  methscopolamine (PAMINE FORTE) 5 MG tablet TAKE 1 TABLET TWICE A DAY 01/18/19  Yes Ladene Artist, MD  ONE TOUCH ULTRA TEST test strip USE AS INSTRUCTED   Yes Weber, Sarah L, PA-C  potassium chloride SA (K-DUR) 20 MEQ tablet Take 1 tablet (20 mEq total) by mouth 2 (two) times daily. 01/08/19  Yes Rutherford Guys, MD  Probiotic Product (PROBIOTIC PO) Take 1 tablet at bedtime by mouth.    Yes [provider]  sertraline (ZOLOFT) 100 MG tablet Take 200 mg daily with breakfast by mouth.  03/16/17  Yes [provider]  traZODone (DESYREL) 100 MG tablet  05/28/18  Yes [provider]  vitamin B-12 (CYANOCOBALAMIN) 500 MCG tablet Take 500 mcg by mouth at bedtime.   Yes [provider]  XIFAXAN 550 MG TABS tablet TAKE 1 TABLET TWICE A DAY 01/18/19  Yes Ladene Artist, MD    Past Medical History:  Diagnosis Date  . Adenomatous colon polyp 10/1999  . Anxiety   . Arthritis    neck, yoga helps.  . At high risk for falls    due to Seizure disorder  . Cancer ([place])    skin  . Cataract   . Complex  partial seizure ([place])    last seizure was 08-30-2018  . Crohn's disease of small and large intestines ([place]) 1999  . Depression   . Diabetes mellitus    no meds at this time 10-24-17  . Esophageal stricture   . External hemorrhoids   . GERD (gastroesophageal reflux disease)   . History of kidney stones    3 large stones still present  . Hyperlipemia   . Hypertension    resolved with weight loss   . Migraines   . MRSA (methicillin resistant Staphylococcus aureus) 10/2010   . Osteoporosis   . PONV (postoperative nausea and vomiting)   . PTSD (post-traumatic stress disorder)   . Renal cyst, acquired, left 07/08/2017  . Renal lesion 05/28/2017  . Seizures ([place])    last seizure was yesterday per pt in [place]- has seizures at [place] 6 days a week   . Skin cancer    squamous cell  multiple; [place]; followed every 3 months.  . Sleep apnea    CPAP machine, uses nightly  . Staphylococcus aureus bacteremia with sepsis ([place]) 05/28/2017    Past Surgical History:  Procedure Laterality Date  . CATARACT EXTRACTION Bilateral   . COLONOSCOPY    . ELBOW SURGERY  2012   elbow MRSA infection   . EYE SURGERY     cataracts removed, /w IOL  . INSERTION OF MESH N/A 07/01/2016   Procedure: INSERTION OF MESH;  Surgeon: [name], MD;  Location: [facility];  Service: General;  Laterality: N/A;  . IR URETERAL STENT LEFT NEW ACCESS W/O SEP NEPHROSTOMY CATH  02/02/2018  . NEPHROLITHOTOMY Left 02/02/2018   Procedure: NEPHROLITHOTOMY PERCUTANEOUS;  Surgeon: [name], MD;  Location: [facility];  Service: Urology;  Laterality: Left;  . POLYPECTOMY    . SCALP LACERATION REPAIR Right 10/16/2017   From fall/staples  . SHOULDER ARTHROSCOPY Right 03/05/2018   Procedure: ARTHROSCOPY SHOULDER AND OPEN DISTAL CLAVICLE EXCISION;  Surgeon: [name], MD;  Location: [facility];  Service: Orthopedics;  Laterality: Right;  . SHOULDER SURGERY    . SINUS SURGERY WITH INSTATRAK    . TEE WITHOUT CARDIOVERSION N/A 05/09/2017    Procedure: TRANSESOPHAGEAL ECHOCARDIOGRAM (TEE);  Surgeon: [name], MD;  Location[facility];  Service: Cardiovascular;  Laterality: N/A;  . TRANSTHORACIC ECHOCARDIOGRAM  02/22/2011   EF 55-65%; increased pattern of LVH with mild conc hypertrophy, abnormal relaxation & increased filling pressure (grade 2 diastolic dysfunction); atrial septum thickened (lipomatous hypertrophy)  . UMBILICAL HERNIA REPAIR N/A 07/01/2016   Procedure: UMBILICAL HERNIA REPAIR WITH MESH;  Surgeon: [name], MD;  Location: [place];  Service: General;  Laterality: N/A;  . UPPER GASTROINTESTINAL ENDOSCOPY    . VASECTOMY      Social History   Tobacco Use  . Smoking status: Never Smoker  . Smokeless tobacco: Never Used  Substance Use Topics  . Alcohol use: No    Family History  Problem Relation Age of Onset  . Colon polyps Father   . Stroke Father   . Hyperlipidemia Father   . Hypertension Father   . Heart disease Mother        CABG at age 39  . Hyperlipidemia Mother   . Hypertension Mother   . Stroke Paternal Grandmother   . Stroke Paternal Grandfather   . Other Child        tetrology of fallot (cornealia deland syndrome)  . Colon cancer Neg Hx   . Esophageal cancer Neg Hx   . Stomach cancer Neg Hx   . Rectal cancer Neg Hx     ROS Per hpi  Objective  Vitals as reported by the patient: none   ASSESSMENT and PLAN  1. Frequent falls Forms for long term disability completed. Patient significantly impaired given high number of falls and degree of dizziness  2. Partial symptomatic epilepsy with complex partial seizures, not intractable, without status epilepticus ([place])  3. Crohn's disease of colon with rectal bleeding ([place])  4. Current moderate episode of major depressive disorder without prior episode ([place])  FOLLOW-UP: as scheduled   The above assessment and management plan was discussed with the patient. The patient verbalized understanding of and has agreed to the management  plan. Patient is aware to call the clinic if symptoms persist or worsen. Patient is aware when to return to the clinic for a follow-up visit. Patient educated on when it is appropriate to go to the emergency department.  I provided 11 minutes of non-face-to-face time during this encounter.  Rutherford Guys, MD Primary [facility] at [facility] [place], [place] [address] Ph.  [phone] Fax [phone]

## 2019-02-02 ENCOUNTER — Telehealth: Payer: Self-pay

## 2019-02-02 NOTE — Telephone Encounter (Signed)
Form completed for disability, copied, pt is coming today to pick up. Copy sent for scan

## 2019-02-02 NOTE — Telephone Encounter (Signed)
Forms completed and patient informed to pick up forms

## 2019-02-10 DIAGNOSIS — E119 Type 2 diabetes mellitus without complications: Secondary | ICD-10-CM | POA: Diagnosis not present

## 2019-02-10 DIAGNOSIS — H35013 Changes in retinal vascular appearance, bilateral: Secondary | ICD-10-CM | POA: Diagnosis not present

## 2019-02-10 DIAGNOSIS — H40023 Open angle with borderline findings, high risk, bilateral: Secondary | ICD-10-CM | POA: Diagnosis not present

## 2019-02-10 DIAGNOSIS — Z961 Presence of intraocular lens: Secondary | ICD-10-CM | POA: Diagnosis not present

## 2019-02-10 LAB — HM DIABETES EYE EXAM

## 2019-02-24 ENCOUNTER — Ambulatory Visit: Payer: 59 | Admitting: Neurology

## 2019-03-15 DIAGNOSIS — H401234 Low-tension glaucoma, bilateral, indeterminate stage: Secondary | ICD-10-CM | POA: Diagnosis not present

## 2019-04-02 ENCOUNTER — Other Ambulatory Visit: Payer: Self-pay | Admitting: Gastroenterology

## 2019-04-07 DIAGNOSIS — C44629 Squamous cell carcinoma of skin of left upper limb, including shoulder: Secondary | ICD-10-CM | POA: Diagnosis not present

## 2019-04-07 DIAGNOSIS — L570 Actinic keratosis: Secondary | ICD-10-CM | POA: Diagnosis not present

## 2019-04-07 DIAGNOSIS — Z85828 Personal history of other malignant neoplasm of skin: Secondary | ICD-10-CM | POA: Diagnosis not present

## 2019-04-07 DIAGNOSIS — D485 Neoplasm of uncertain behavior of skin: Secondary | ICD-10-CM | POA: Diagnosis not present

## 2019-04-12 DIAGNOSIS — H401234 Low-tension glaucoma, bilateral, indeterminate stage: Secondary | ICD-10-CM | POA: Diagnosis not present

## 2019-05-13 DIAGNOSIS — H401234 Low-tension glaucoma, bilateral, indeterminate stage: Secondary | ICD-10-CM | POA: Diagnosis not present

## 2019-05-17 DIAGNOSIS — E119 Type 2 diabetes mellitus without complications: Secondary | ICD-10-CM | POA: Diagnosis not present

## 2019-05-17 DIAGNOSIS — H53483 Generalized contraction of visual field, bilateral: Secondary | ICD-10-CM | POA: Diagnosis not present

## 2019-05-17 DIAGNOSIS — Z113 Encounter for screening for infections with a predominantly sexual mode of transmission: Secondary | ICD-10-CM | POA: Diagnosis not present

## 2019-05-17 DIAGNOSIS — H469 Unspecified optic neuritis: Secondary | ICD-10-CM | POA: Diagnosis not present

## 2019-05-17 DIAGNOSIS — Z961 Presence of intraocular lens: Secondary | ICD-10-CM | POA: Diagnosis not present

## 2019-05-19 ENCOUNTER — Other Ambulatory Visit: Payer: Self-pay | Admitting: Pediatrics

## 2019-05-19 DIAGNOSIS — H53483 Generalized contraction of visual field, bilateral: Secondary | ICD-10-CM

## 2019-05-19 DIAGNOSIS — H469 Unspecified optic neuritis: Secondary | ICD-10-CM

## 2019-06-05 ENCOUNTER — Other Ambulatory Visit: Payer: Self-pay | Admitting: Gastroenterology

## 2019-06-05 ENCOUNTER — Other Ambulatory Visit: Payer: Self-pay | Admitting: Neurology

## 2019-06-09 ENCOUNTER — Other Ambulatory Visit: Payer: Self-pay

## 2019-06-09 ENCOUNTER — Ambulatory Visit
Admission: RE | Admit: 2019-06-09 | Discharge: 2019-06-09 | Disposition: A | Discharge status: Home / Self Care | Payer: 59 | Source: Ambulatory Visit | Attending: Pediatrics | Admitting: Pediatrics

## 2019-06-09 DIAGNOSIS — H53483 Generalized contraction of visual field, bilateral: Secondary | ICD-10-CM

## 2019-06-09 DIAGNOSIS — H469 Unspecified optic neuritis: Secondary | ICD-10-CM

## 2019-06-09 MED ORDER — GADOBENATE DIMEGLUMINE 529 MG/ML IV SOLN: 18.0000 mL | Freq: Once | INTRAVENOUS | Status: DC | PRN

## 2019-06-15 ENCOUNTER — Encounter: Payer: Self-pay | Admitting: Family Medicine

## 2019-06-23 DIAGNOSIS — H53483 Generalized contraction of visual field, bilateral: Secondary | ICD-10-CM | POA: Diagnosis not present

## 2019-06-23 DIAGNOSIS — H547 Unspecified visual loss: Secondary | ICD-10-CM | POA: Diagnosis not present

## 2019-07-01 ENCOUNTER — Other Ambulatory Visit: Payer: Self-pay

## 2019-07-01 ENCOUNTER — Ambulatory Visit (INDEPENDENT_AMBULATORY_CARE_PROVIDER_SITE_OTHER): Payer: 59 | Admitting: Family Medicine

## 2019-07-01 ENCOUNTER — Encounter: Payer: Self-pay | Admitting: Family Medicine

## 2019-07-01 VITALS — BP 124/78 | HR 84 | Temp 98.2°F | Ht 71.0 in | Wt 192.8 lb

## 2019-07-01 DIAGNOSIS — R2689 Other abnormalities of gait and mobility: Secondary | ICD-10-CM | POA: Diagnosis not present

## 2019-07-01 DIAGNOSIS — Z9989 Dependence on other enabling machines and devices: Secondary | ICD-10-CM | POA: Diagnosis not present

## 2019-07-01 DIAGNOSIS — G4733 Obstructive sleep apnea (adult) (pediatric): Secondary | ICD-10-CM | POA: Diagnosis not present

## 2019-07-01 DIAGNOSIS — R569 Unspecified convulsions: Secondary | ICD-10-CM

## 2019-07-01 MED ORDER — LAMOTRIGINE 150 MG PO TABS: 150.0000 mg | ORAL_TABLET | Freq: Two times a day (BID) | ORAL | 3 refills | Status: DC

## 2019-07-01 NOTE — Progress Notes (Signed)
PATIENT: [name] [name] DOB: 07/10/1960  REASON FOR VISIT: follow up HISTORY FROM: patient  Chief Complaint  Patient presents with  . Follow-up    6 mon f/u. Alone. Rm 1. Patient mentioned that he has been having some balance concerns.     HISTORY OF PRESENT ILLNESS: Today 07/01/19 [name] [name] is a 59 y.o. male here today for follow up for seizures and CPAP compliance. He continues lamotrigine 132m twice daily. He feels that he is stable from a seizure perspective. He does continues to have spells where he has an overwhelming feeling that comes over him. In the summer he may start sweating. He feels that he is unable to respond to what is going on around him but is completely aware of his surroundings. Sometimes he does not recognize who family members are for a few hours. Memory always returns. This happens about 10 times a year.  No LOC, tongue biting or incontinence. Multiple changes in epileptic medications have not improved spells. He feels they happen 1-2 times a month.   He continues to see psychiatry monthly and counselor every other week. He continues Zoloft and Haldol. He feels he is doing fairly well. He continues to see PCP regularly. He has had three falls in the past three years. He reports getting dizzy after about 15 minutes of walking. He reports that PCP is following and he has scheduled follow up next week.   Compliance report dated 05/31/2019 through 06/29/2019 reveals that he used CPAP 29 of the last 30 days for compliance of 97%. 25 of the last 30 days he used CPAP greater than 4 hours for compliance of 83%. Average usage was 6 hours and 47 minutes. Residual AHI was 3.7 on 6 to 16 cm of water and an EPR of 3. He did have a leak noted in the 95th percentile of 36.8. He states that he is able to tighten his mask.   HISTORY: (copied from my note on 12/28/2018)  [name] WECKERLYis a 59y.o. male here today for follow up of OSA on CPAP and seizure like episodes (followed by Dr  YKrista Blue.   Compliance report dated 11/23/2021 12/23/2018 reveals that he is using CPAP 30 out of the last 30 days for compliance 100%.  He is using CPAP greater than 4 hours 29 out of the last 30 days for compliance 97%.  AHI was 2.9 on 6 to 16 cm of water and EPR of 3.  There was a mild leak noted in the 95th percentile of 24.2. He is in need of new supplies.   He continues to have 2-3 seizure like events per week with confusion, dizziness, anger, headaches and memory loss. He is taking Lamictal 1543mtwice daily as prescribed. Dose was increased in 2019 but he does not feel this has helped. He has been on multiple antiepileptic medications in the past with no relief of seizure like events. He is seeing psychiatry for high stress levels, depression and potential conversion disorder. He is seen regularly. He denies urinary incontinence, LOC or biting of tongue. He does not drive.    HISTORY: (copied from Dr Dohmeier's note on 02/18/2018)  HISTORY FROM:Patient here alone for CPAP compliance. 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 He has been suffering a right shoulder and scapula injury when his dog caused him to fall,  surgery pending with Dr. Rhona Raider. He has been diagnosed with 9 kidney stones, these were removed surgically. He used a couple of days pain killers, was give #40 of percocet, but used sparingly.  He has a family history of addiction, brother and uncle affected.  The patient underwent a home sleep test on apnea link on 30 Oct 2017, goal was to replace his by that time 102-year-old machine. The home sleep test confirmed an AHI of 32, RDI of 34.2/h, 32 minutes of oxygen desaturation under 89%. The patient qualified with these results of course for new CPAP. It was an auto titration capable machine with a pressure window between 6 and 16 cmH2O and 3 cm EPR mask of his choice. He has used the  machine compliantly for 93% of the last 30 days, 6 hours and 9 minutes of his average use of time, the residual AHI is 5.1 dated 02-15-18. Pressure at the 95th percentile is 12 cmH2O air leaks are mild and have actually improved over the last 14 days. He has tightened the headgear at that time he reports. Uses a FFM-   He remains depressed , sees a therapist.He remains excessively daytime sleepy ;his current Epworth sleepiness score is endorsed at 21/24 points, fatigue severity at 30/63 points. His residual AHI of 5.1/hcan hardly explain the degree of excessive daytime sleepiness -I wonder if it Crohn's , recovery from kidneysurgery,The severedepression or other metabolic or illness related factors.   Dr Rhea Belton interval visit :He had a past medical history of diabetes, hypertension, Crohn's disease, on immunosuppressive treatment, He presented with seizure-like event, reported initial event was June 28 2014, he was found by his wife to difficulty to be awake and eyes closed, took a while for him to open his eyes, and become very confused, also angry, episodes last about 5-10 minutes, there was also witnessed leg extension, jerking movement, He had multiple recurrent confusion episodes later,MRI of the brain, MRA of the brain in April 2013 was normal,EEG showed intermittent left anterior and medial temporal lobe slowing  Interval history from 03/11/2017, I have the pleasure of seeing [name] [name] a year for compliance visit and his CPAP compliance is excellent. He is used to machine 97% of the time over 4 hours 100% of the last 30 days, his average user time of 6 hours 27 minutes the machine is set at 10 cm pressure this 2 cm expiratory pressure relief. His residual AHI is 2.8 but he does have high air leaks. These however does not did not distort the apnea count. He also feels that without this CPAP he cannot sleep as well. His fatigue questionnaire was endorsed high at 47 points, his  Epworth sleepiness score was endorsed at 21 points out of 24 possible points, also very high. He continues to feel depressed, and I was able to review Dr. Lynelle Smoke report which stated that the patient's major depression was interfering with some of the validity of cognitive testing. She felt that there was room for conversion disorder based on the patient's own feeling of worthlessness, hopelessness and sadness. [name]. [name] reports that he had a seizure last night in spite of being compliant with his seizure medications.   REVIEW OF SYSTEMS: Out of a complete 14 system review of symptoms, the patient complains only of the following symptoms, seizure, non epileptic spells, imbalance and all other reviewed systems are negative.   ALLERGIES: Allergies  Allergen Reactions  . Azithromycin Swelling    SWELLING REACTION UNSPECIFIED   . Beef-Derived  Products Diarrhea    RED MEAT > UNSPECIFIED REACTION   . Milk-Related Compounds Diarrhea    DAIRY >UNSPECIFIED REACTION   . Penicillins Hives    Rash as child- tolerates Ancef/CHILDHOOD ALLERGY Has patient had a PCN reaction causing immediate rash, facial/tongue/throat swelling, SOB or lightheadedness with hypotension: YES Has patient had a PCN reaction causing severe rash involving mucus membranes or skin necrosis: Unknown Has patient had a PCN reaction that required hospitalization: Unknown Has patient had a PCN reaction occurring within the last 10 years:Unknown If all of the above answers are "NO", then may proceed with Cephalosporin [place]  . Sulfonamide Derivatives Hives  . Dilaudid [Hydromorphone Hcl] Nausea And Vomiting    HOME MEDICATIONS: Outpatient Medications Prior to Visit  Medication Sig Dispense Refill  . ARIPiprazole (ABILIFY) 5 MG tablet TK 1 T PO QHS FOR MOOD  0  . Ascorbic Acid (VITAMIN C) 1000 MG tablet Take 1,000 mg by mouth at bedtime.    Marland Kitchen aspirin EC 81 MG tablet Take 81 mg at bedtime by mouth.     . Calcium  Carbonate-Vitamin D (CALCIUM 600+D PO) Take 1 tablet by mouth 2 (two) times daily.    . cholecalciferol (VITAMIN D) 1000 units tablet Take 1,000 Units at bedtime by mouth.     . eletriptan (RELPAX) 20 MG tablet Take 1 tablet (20 mg total) by mouth as needed for migraine or headache. May repeat in 2 hours if headache persists or recurs. 10 tablet 0  . esomeprazole (NEXIUM) 40 MG capsule TAKE 1 CAPSULE TWICE DAILY,NEED APPOINTMENT FOR       FURTHER REFILLS 180 capsule 0  . ferrous sulfate 325 (65 FE) MG tablet Take 325 mg at bedtime by mouth.     . folic acid (FOLVITE) 1 MG tablet Take 2 tablets (2 mg total) by mouth daily. 180 tablet 3  . glucose blood (CHOICE DM FORA G20 TEST STRIPS) test strip Use as instructed 100 each 12  . haloperidol (HALDOL) 0.5 MG tablet TK 1 T PO HS FOR MOOD/DELUSION    . mercaptopurine (PURINETHOL) 50 MG tablet TAKE 2 TABLETS DAILY ON AN EMPTY STOMACH 1 HOUR BEFORE OR 2 HOURS AFTER MEAL (CAUTION CHEMOTHERAPY). MAKE AN APPOINTMENT FOR FURTHER REFILLS 180 tablet 0  . mesalamine (LIALDA) 1.2 g EC tablet TAKE 2 TABLETS (2.4GM)     DAILY WITH BREAKFAST.      NEEDS APPOINTMENT FOR      FURTHER REFILLS 180 tablet 0  . methscopolamine (PAMINE FORTE) 5 MG tablet TAKE 1 TABLET TWICE A DAY. NEEDS APPOINTMENT FOR      FURTHER REFILLS 180 tablet 0  . ONE TOUCH ULTRA TEST test strip USE AS INSTRUCTED 100 each 2  . potassium chloride SA (K-DUR) 20 MEQ tablet Take 1 tablet (20 mEq total) by mouth 2 (two) times daily. 180 tablet 3  . Probiotic Product (PROBIOTIC PO) Take 1 tablet at bedtime by mouth.     . sertraline (ZOLOFT) 100 MG tablet Take 200 mg daily with breakfast by mouth.     . traZODone (DESYREL) 100 MG tablet     . vitamin B-12 (CYANOCOBALAMIN) 500 MCG tablet Take 500 mcg by mouth at bedtime.    Marland Kitchen XIFAXAN 550 MG TABS tablet TAKE 1 TABLET TWICE A DAY 180 tablet 0  . lamoTRIgine (LAMICTAL) 150 MG tablet Take 1 tablet (150 mg total) by mouth 2 (two) times daily. 180 tablet 1    Facility-Administered Medications Prior to Visit  Medication Dose  Route Frequency Provider Last Rate Last Admin  . gadopentetate dimeglumine (MAGNEVIST) injection 20 mL  20 mL Intravenous Once PRN Marcial Pacas, MD        PAST MEDICAL HISTORY: Past Medical History:  Diagnosis Date  . Adenomatous colon polyp 10/1999  . Anxiety   . Arthritis    neck, yoga helps.  . At high risk for falls    due to Seizure disorder  . Cancer ([place])    skin  . Cataract   . Complex partial seizure ([place])    last seizure was 08-30-2018  . Crohn's disease of small and large intestines ([place]) 1999  . Depression   . Diabetes mellitus    no meds at this time 10-24-17  . Esophageal stricture   . External hemorrhoids   . GERD (gastroesophageal reflux disease)   . History of kidney stones    3 large stones still present  . Hyperlipemia   . Hypertension    resolved with weight loss   . Migraines   . MRSA (methicillin resistant Staphylococcus aureus) 10/2010   . Osteoporosis   . PONV (postoperative nausea and vomiting)   . PTSD (post-traumatic stress disorder)   . Renal cyst, acquired, left 07/08/2017  . Renal lesion 05/28/2017  . Seizures ([place])    last seizure was yesterday per pt in [place]- has seizures at [facility] 6 days a week   . Skin cancer    squamous cell multiple; [facility]; followed every 3 months.  . Sleep apnea    CPAP machine, uses nightly  . Staphylococcus aureus bacteremia with sepsis ([place]) 05/28/2017    PAST SURGICAL HISTORY: Past Surgical History:  Procedure Laterality Date  . CATARACT EXTRACTION Bilateral   . COLONOSCOPY    . ELBOW SURGERY  2012   elbow MRSA infection   . EYE SURGERY     cataracts removed, /w IOL  . INSERTION OF MESH N/A 07/01/2016   Procedure: INSERTION OF MESH;  Surgeon: [name], MD;  Location: [facility];  Service: General;  Laterality: N/A;  . IR URETERAL STENT LEFT NEW ACCESS W/O SEP NEPHROSTOMY CATH  02/02/2018  . NEPHROLITHOTOMY Left 02/02/2018   Procedure:  NEPHROLITHOTOMY PERCUTANEOUS;  Surgeon: [name], MD;  Location: [facility];  Service: Urology;  Laterality: Left;  . POLYPECTOMY    . SCALP LACERATION REPAIR Right 10/16/2017   From fall/staples  . SHOULDER ARTHROSCOPY Right 03/05/2018   Procedure: ARTHROSCOPY SHOULDER AND OPEN DISTAL CLAVICLE EXCISION;  Surgeon: [name], MD;  Location: [facility];  Service: Orthopedics;  Laterality: Right;  . SHOULDER SURGERY    . SINUS SURGERY WITH INSTATRAK    . TEE WITHOUT CARDIOVERSION N/A 05/09/2017   Procedure: TRANSESOPHAGEAL ECHOCARDIOGRAM (TEE);  Surgeon: [name], MD;  Location[facility];  Service: Cardiovascular;  Laterality: N/A;  . TRANSTHORACIC ECHOCARDIOGRAM  02/22/2011   EF 55-65%; increased pattern of LVH with mild conc hypertrophy, abnormal relaxation & increased filling pressure (grade 2 diastolic dysfunction); atrial septum thickened (lipomatous hypertrophy)  . UMBILICAL HERNIA REPAIR N/A 07/01/2016   Procedure: UMBILICAL HERNIA REPAIR WITH MESH;  Surgeon: [name], MD;  Location: [facility];  Service: General;  Laterality: N/A;  . UPPER GASTROINTESTINAL ENDOSCOPY    . VASECTOMY      FAMILY HISTORY: Family History  Problem Relation Age of Onset  . Colon polyps Father   . Stroke Father   . Hyperlipidemia Father   . Hypertension Father   . Heart disease Mother  CABG at age 68  . Hyperlipidemia Mother   . Hypertension Mother   . Stroke Paternal Grandmother   . Stroke Paternal Grandfather   . Other Child        tetrology of fallot (cornealia deland syndrome)  . Colon cancer Neg Hx   . Esophageal cancer Neg Hx   . Stomach cancer Neg Hx   . Rectal cancer Neg Hx     SOCIAL HISTORY: Social History   Socioeconomic History  . Marital status: Married    Spouse name: [name]  . Number of children: 2  . Years of education: college  . Highest education level: Not on file  Occupational History  . Occupation: disabled    Comment: seizure disorder with memory  impairment  Tobacco Use  . Smoking status: Never Smoker  . Smokeless tobacco: Never Used  Substance and Sexual Activity  . Alcohol use: No  . Drug use: No  . Sexual activity: Yes    Birth control/protection: Surgical    Comment: 1 partner in last 12 months  Other Topics Concern  . Not on file  Social History Narrative   Marital status: married x 32 years;       Lives:   lives at home with his family.      Children:  two children; no grandchildren.  Son is special needs at age 63yo; Cornelian Delaine with TOF.       Employment: unemployed; [employer] long term disability      Tobacco: none      Alcohol: none      Drugs: none      Exercise: unable to exercise due to frequent falls   Patient has a college education.   Patient is right-handed.   Patient drinks one cup of soda daily.   Social Determinants of Health   Financial Resource Strain:   . Difficulty of Paying Living Expenses: Not on file  Food Insecurity:   . Worried About Charity fundraiser in the Last Year: Not on file  . Ran Out of Food in the Last Year: Not on file  Transportation Needs:   . Lack of Transportation (Medical): Not on file  . Lack of Transportation (Non-Medical): Not on file  Physical Activity:   . Days of Exercise per Week: Not on file  . Minutes of Exercise per Session: Not on file  Stress:   . Feeling of Stress : Not on file  Social Connections:   . Frequency of Communication with Friends and Family: Not on file  . Frequency of Social Gatherings with Friends and Family: Not on file  . Attends Religious Services: Not on file  . Active Member of Clubs or Organizations: Not on file  . Attends Archivist Meetings: Not on file  . Marital Status: Not on file  Intimate Partner Violence:   . Fear of Current or Ex-Partner: Not on file  . Emotionally Abused: Not on file  . Physically Abused: Not on file  . Sexually Abused: Not on file    PHYSICAL EXAM  Vitals:   07/01/19 1304   BP: 124/78  Pulse: 84  Temp: 98.2 F (36.8 C)  TempSrc: Oral  Weight: 192 lb 12.8 oz (87.5 kg)  Height: 5' 11"  (1.803 m)   Body mass index is 26.89 kg/m.  Generalized: Well developed, in no acute distress  Cardiology: normal rate and rhythm, no murmur noted Respiratory: clear to auscultation bilaterally  Neurological examination  Mentation: Alert oriented  to time, place, history taking. Follows all commands speech and language fluent Cranial nerve II-XII: Pupils were equal round reactive to light. Extraocular movements were full, visual field were full on confrontational test. Facial sensation and strength were normal. Uvula tongue midline. Head turning and shoulder shrug  were normal and symmetric. Motor: The motor testing reveals 5 over 5 strength of all 4 extremities. Good symmetric motor tone is noted throughout.  Sensory: Sensory testing is intact to soft touch on all 4 extremities. No evidence of extinction is noted.   Gait and station: Gait is normal  DIAGNOSTIC DATA (LABS, IMAGING, TESTING) - I reviewed patient records, labs, notes, testing and imaging myself where available.  MMSE - Mini Mental State Exam 10/07/2017 04/02/2017 04/30/2016  Orientation to time 2 2 3   Orientation to Place 4 5 3   Registration 3 3 3   Attention/ Calculation 2 5 3   Recall 3 1 2   Language- name 2 objects 2 2 2   Language- repeat 1 1 1   Language- follow 3 step command 2 3 3   Language- read & follow direction 1 1 1   Write a sentence 1 1 1   Copy design 0 1 1  Total score 21 25 23      Lab Results  Component Value Date   WBC 3.6 12/28/2018   HGB 11.4 (L) 12/28/2018   HCT 30.7 (L) 12/28/2018   MCV 101 (H) 12/28/2018   PLT 87 (LL) 12/28/2018      Component Value Date/Time   NA 143 01/08/2019 1427   K 4.1 01/08/2019 1427   CL 109 (H) 01/08/2019 1427   CO2 21 01/08/2019 1427   GLUCOSE 95 01/08/2019 1427   GLUCOSE 103 (H) 03/05/2018 1211   BUN 13 01/08/2019 1427   CREATININE 0.88  01/08/2019 1427   CREATININE 0.84 05/15/2016 1005   CALCIUM 9.4 01/08/2019 1427   PROT 6.7 01/08/2019 1427   ALBUMIN 4.6 01/08/2019 1427   AST 29 01/08/2019 1427   ALT 16 01/08/2019 1427   ALKPHOS 162 (H) 01/08/2019 1427   BILITOT 1.8 (H) 01/08/2019 1427   GFRNONAA 95 01/08/2019 1427   GFRNONAA >89 06/03/2015 0858   GFRAA 110 01/08/2019 1427   GFRAA >89 06/03/2015 0858   Lab Results  Component Value Date   CHOL 169 07/09/2018   HDL 80 07/09/2018   LDLCALC 69 07/09/2018   TRIG 99 07/09/2018   CHOLHDL 2.1 07/09/2018   Lab Results  Component Value Date   HGBA1C 4.6 (L) 01/08/2019   Lab Results  Component Value Date   VITAMINB12 1,733 (H) 01/08/2019   Lab Results  Component Value Date   TSH 2.57 08/27/2017       ASSESSMENT AND PLAN 59 y.o. year old male  has a past medical history of Adenomatous colon polyp (10/1999), Anxiety, Arthritis, At high risk for falls, Cancer ([place]), Cataract, Complex partial seizure ([place]), Crohn's disease of small and large intestines ([place]) (1999), Depression, Diabetes mellitus, Esophageal stricture, External hemorrhoids, GERD (gastroesophageal reflux disease), History of kidney stones, Hyperlipemia, Hypertension, Migraines, MRSA (methicillin resistant Staphylococcus aureus) (10/2010 ), Osteoporosis, PONV (postoperative nausea and vomiting), PTSD (post-traumatic stress disorder), Renal cyst, acquired, left (07/08/2017), Renal lesion (05/28/2017), Seizures ([place]), Skin cancer, Sleep apnea, and Staphylococcus aureus bacteremia with sepsis ([place]) (05/28/2017). here with     ICD-10-CM   1. OSA on CPAP  G47.33 For home use only DME continuous positive airway pressure (CPAP)   Z99.89   2. Seizure ([place])  R56.9 lamoTRIgine (LAMICTAL) 150 MG tablet  3. Nonepileptic episode ([facility])  R56.9 lamoTRIgine (LAMICTAL) 150 MG tablet  4. Imbalance  R26.89     Tiant is doing well. He continues lamotrigine as prescribed with no obvious adverse effects. He continues to have non  epileptic events regularly. He is followed closely by psychiatry and psychology as well as PCP. No neuro deficit noted on exam. Compliance report reveals optimal compliance. He will continue CPAP nightly and for greater than 4 hours each night. We will send an order for new supplies today. He will continue to work with PCP regarding imbalance concerns. Labs normal in July. He will follow up in 1 year, sooner if needed. He verbalizes understanding and agreement with this plan.   Orders Placed This Encounter  Procedures  . For home use only DME continuous positive airway pressure (CPAP)    Supplies    Order Specific Question:   Length of Need    Answer:   Lifetime    Order Specific Question:   Patient has OSA or probable OSA    Answer:   Yes    Order Specific Question:   Is the patient currently using CPAP in the home    Answer:   Yes    Order Specific Question:   Settings    Answer:   Other see comments    Order Specific Question:   CPAP supplies needed    Answer:   Mask, headgear, cushions, filters, heated tubing and water chamber     Meds ordered this encounter  Medications  . lamoTRIgine (LAMICTAL) 150 MG tablet    Sig: Take 1 tablet (150 mg total) by mouth 2 (two) times daily.    Dispense:  180 tablet    Refill:  3    Order Specific Question:   Supervising Provider    Answer:   Bess Harvest, FNP-C 07/01/2019, 2:04 PM [facility] [address] [place], [place] [address] [phone]

## 2019-07-01 NOTE — Patient Instructions (Signed)
We will continue lamotrigine 128m twice daily  Continue CPAP nightly and for greater than 4 hours each night   Continue to work with PCP for balance concerns.   Follow up in 1 year, sooner if needed     Sleep Apnea Sleep apnea affects breathing during sleep. It causes breathing to stop for a short time or to become shallow. It can also increase the risk of:  Heart attack.  Stroke.  Being very overweight (obese).  Diabetes.  Heart failure.  Irregular heartbeat. The goal of treatment is to help you breathe normally again. What are the causes? There are three kinds of sleep apnea:  Obstructive sleep apnea. This is caused by a blocked or collapsed airway.  Central sleep apnea. This happens when the brain does not send the right signals to the muscles that control breathing.  Mixed sleep apnea. This is a combination of obstructive and central sleep apnea. The most common cause of this condition is a collapsed or blocked airway. This can happen if:  Your throat muscles are too relaxed.  Your tongue and tonsils are too large.  You are overweight.  Your airway is too small. What increases the risk?  Being overweight.  Smoking.  Having a small airway.  Being older.  Being male.  Drinking alcohol.  Taking medicines to calm yourself (sedatives or tranquilizers).  Having family members with the condition. What are the signs or symptoms?  Trouble staying asleep.  Being sleepy or tired during the day.  Getting angry a lot.  Loud snoring.  Headaches in the morning.  Not being able to focus your mind (concentrate).  Forgetting things.  Less interest in sex.  Mood swings.  Personality changes.  Feelings of sadness (depression).  Waking up a lot during the night to pee (urinate).  Dry mouth.  Sore throat. How is this diagnosed?  Your medical history.  A physical exam.  A test that is done when you are sleeping (sleep study). The test is  most often done in a sleep lab but may also be done at home. How is this treated?   Sleeping on your side.  Using a medicine to get rid of mucus in your nose (decongestant).  Avoiding the use of alcohol, medicines to help you relax, or certain pain medicines (narcotics).  Losing weight, if needed.  Changing your diet.  Not smoking.  Using a machine to open your airway while you sleep, such as: ? An oral appliance. This is a mouthpiece that shifts your lower jaw forward. ? A CPAP device. This device blows air through a mask when you breathe out (exhale). ? An EPAP device. This has valves that you put in each nostril. ? A BPAP device. This device blows air through a mask when you breathe in (inhale) and breathe out.  Having surgery if other treatments do not work. It is important to get treatment for sleep apnea. Without treatment, it can lead to:  High blood pressure.  Coronary artery disease.  In men, not being able to have an erection (impotence).  Reduced thinking ability. Follow these instructions at home: Lifestyle  Make changes that your doctor recommends.  Eat a healthy diet.  Lose weight if needed.  Avoid alcohol, medicines to help you relax, and some pain medicines.  Do not use any products that contain nicotine or tobacco, such as cigarettes, e-cigarettes, and chewing tobacco. If you need help quitting, ask your doctor. General instructions  Take over-the-counter and prescription medicines  only as told by your doctor.  If you were given a machine to use while you sleep, use it only as told by your doctor.  If you are having surgery, make sure to tell your doctor you have sleep apnea. You may need to bring your device with you.  Keep all follow-up visits as told by your doctor. This is important. Contact a doctor if:  The machine that you were given to use during sleep bothers you or does not seem to be working.  You do not get better.  You get  worse. Get help right away if:  Your chest hurts.  You have trouble breathing in enough air.  You have an uncomfortable feeling in your back, arms, or stomach.  You have trouble talking.  One side of your body feels weak.  A part of your face is hanging down. These symptoms may be an emergency. Do not wait to see if the symptoms will go away. Get medical help right away. Call your local emergency services (911 in the [place] Do not drive yourself to the hospital. Summary  This condition affects breathing during sleep.  The most common cause is a collapsed or blocked airway.  The goal of treatment is to help you breathe normally while you sleep. This information is not intended to replace advice given to you by your health care provider. Make sure you discuss any questions you have with your health care provider. Document Revised: 03/27/2018 Document Reviewed: 02/03/2018 Elsevier Patient Education  2020 [place] in [facility]als, Adult Being a patient in the hospital puts you at risk for falling. Falls can cause serious injury and harm, but they can be prevented. It is important to understand what puts you at risk for falling and what you and your health care team can do to prevent you from falling. If you or a loved one falls at the hospital, it is important to tell hospital staff about it. What increases the risk for falls? Certain conditions and treatments may increase your risk of falling in the hospital. These include:  Being in an unfamiliar environment, especially when using the bathroom at night.  Having surgery.  Being on bed rest.  Taking many medicines or certain types of medicines, such as sleeping pills.  Having tubes in place, such as IV lines or catheters. Other risk factors for falls in a hospital include:  Having difficulty with hearing or vision.  Having a change in thinking or behavior, such as confusion.  Having  depression.  Having trouble with balance.  Being a male.  Feeling dizzy.  Needing to use the toilet frequently.  Having fallen during the past three months.  Having low blood pressure. What are some strategies for preventing falls? If you or a loved one has to stay in the hospital:  Ask about which fall prevention strategies will be in place. Do not hesitate to speak up if you notice that the fall prevention plan has changed.  Ask for help moving around, especially after surgery or when feeling unwell.  If you have been asked to call for help when getting up, do not get up by yourself. Asking for help with getting up is for your safety, and the staff is there to help you.  Wear nonskid footwear.  Get up slowly, and sit at the side of the bed for a few minutes before standing up.  Keep items you need, such as the nurse call button or  a phone, close to you so that you do not need to reach for them.  Wear eyeglasses or hearing aids if you have them.  Have someone stay in the hospital with you or your loved one.  Ask if sleeping pills or other medicines that can cause confusion are necessary. What does the hospital staff do to help prevent falls? Hospitals have systems in place to prevent falls and accidents, which may involve:  Discussing your fall risks and making a personalized fall prevention plan.  Checking in regularly to see if you need help.  Placing an arm band on your wrist or a sign near your room to alert other staff of your needs.  Using an alarm on your hospital bed. This is an alarm that goes off if you get out of bed and forget to call for help.  Keeping the bed in a low and locked position.  Keeping the area around the bed and bathroom well-lit and free from clutter.  Keeping your room quiet, so that you can sleep and be well-rested.  Using safety equipment, such as: ? A belt around your waist. ? Walkers, crutches, and other devices for  support. ? Safety beds, such as low beds or cushions on the floor next to the bed.  Having a staff person stay with you (one-on-one observation), even when you are using the bathroom. This is for your safety.  Using video monitoring. This allows a staff member to come to help you if you need help. What other actions can I take to lower my risk of falls?  Check in regularly with your health care provider or pharmacist to review all of the medicines that you take.  Make sure that you have a regular exercise program to stay fit. This will help you maintain your balance.  Talk with a physical therapist or trainer if recommended by your health care provider. They can help you to improve your strength, balance, and endurance.  If you are over age 42: ? Ask your health care provider if you need a calcium or vitamin D supplement. ? Have your eyes and hearing checked every year. ? Have your feet checked every year. Where to find more information You can find more information about fall prevention from the Centers for Disease Control and Prevention: ImproveLook.cz Summary  Being in an unfamiliar environment, such as the hospital, increases your risk for falling.  If you have been asked to call for help when getting up, do not get up by yourself. Asking for help with getting up is for your safety, and the staff is there to help you.  Ask about which fall prevention strategies will be in place. Do not hesitate to speak up if you notice that the fall prevention plan has changed.  If you or a loved one falls, tell the hospital staff. This is important. This information is not intended to replace advice given to you by your health care provider. Make sure you discuss any questions you have with your health care provider. Document Revised: 05/23/2017 Document Reviewed: 01/22/2017 Elsevier Patient Education  [place].   Seizure, Adult A seizure is a sudden burst of abnormal electrical  activity in the brain. Seizures usually last from 30 seconds to 2 minutes. They can cause many different symptoms. Usually, seizures are not harmful unless they last a long time. What are the causes? Common causes of this condition include:  Fever or infection.  Conditions that affect the brain, such as: ?  A brain abnormality that you were born with. ? A brain or head injury. ? Bleeding in the brain. ? A tumor. ? Stroke. ? Brain disorders such as autism or cerebral palsy.  Low blood sugar.  Conditions that are passed from parent to child (are inherited).  Problems with substances, such as: ? Having a reaction to a drug or a medicine. ? Suddenly stopping the use of a substance (withdrawal). In some cases, the cause may not be known. A person who has repeated seizures over time without a clear cause has a condition called epilepsy. What increases the risk? You are more likely to get this condition if you have:  A family history of epilepsy.  Had a seizure in the past.  A brain disorder.  A history of head injury, lack of oxygen at birth, or strokes. What are the signs or symptoms? There are many types of seizures. The symptoms vary depending on the type of seizure you have. Examples of symptoms during a seizure include:  Shaking (convulsions).  Stiffness in the body.  Passing out (losing consciousness).  Head nodding.  Staring.  Not responding to sound or touch.  Loss of bladder control and bowel control. Some people have symptoms right before and right after a seizure happens. Symptoms before a seizure may include:  Fear.  Worry (anxiety).  Feeling like you may vomit (nauseous).  Feeling like the room is spinning (vertigo).  Feeling like you saw or heard something before (dj vu).  Odd tastes or smells.  Changes in how you see. You may see flashing lights or spots. Symptoms after a seizure happens can  include:  Confusion.  Sleepiness.  Headache.  Weakness on one side of the body. How is this treated? Most seizures will stop on their own in under 5 minutes. In these cases, no treatment is needed. Seizures that last longer than 5 minutes will usually need treatment. Treatment can include:  Medicines given through an IV tube.  Avoiding things that are known to cause your seizures. These can include medicines that you take for another condition.  Medicines to treat epilepsy.  Surgery to stop the seizures. This may be needed if medicines do not help. Follow these instructions at home: Medicines  Take over-the-counter and prescription medicines only as told by your doctor.  Do not eat or drink anything that may keep your medicine from working, such as alcohol. Activity  Do not do any activities that would be dangerous if you had another seizure, like driving or swimming. Wait until your doctor says it is safe for you to do them.  If you live in the [place] ask your local DMV (department of motor vehicles) when you can drive.  Get plenty of rest. Teaching others Teach friends and family what to do when you have a seizure. They should:  Lay you on the ground.  Protect your head and body.  Loosen any tight clothing around your neck.  Turn you on your side.  Not hold you down.  Not put anything into your mouth.  Know whether or not you need emergency care.  Stay with you until you are better.  General instructions  Contact your doctor each time you have a seizure.  Avoid anything that gives you seizures.  Keep a seizure diary. Write down: ? What you think caused each seizure. ? What you remember about each seizure.  Keep all follow-up visits as told by your doctor. This is important. Contact a doctor if:  You have another seizure.  You have seizures more often.  There is any change in what happens during your seizures.  You keep having seizures with  treatment.  You have symptoms of being sick or having an infection. Get help right away if:  You have a seizure that: ? Lasts longer than 5 minutes. ? Is different than seizures you had before. ? Makes it harder to breathe. ? Happens after you hurt your head.  You have any of these symptoms after a seizure: ? Not being able to speak. ? Not being able to use a part of your body. ? Confusion. ? A bad headache.  You have two or more seizures in a row.  You do not wake up right after a seizure.  You get hurt during a seizure. These symptoms may be an emergency. Do not wait to see if the symptoms will go away. Get medical help right away. Call your local emergency services (911 in the [place] Do not drive yourself to the hospital. Summary  Seizures usually last from 30 seconds to 2 minutes. Usually, they are not harmful unless they last a long time.  Do not eat or drink anything that may keep your medicine from working, such as alcohol.  Teach friends and family what to do when you have a seizure.  Contact your doctor each time you have a seizure. This information is not intended to replace advice given to you by your health care provider. Make sure you discuss any questions you have with your health care provider. Document Revised: 08/28/2018 Document Reviewed: 08/28/2018 Elsevier Patient Education  [place].

## 2019-07-12 ENCOUNTER — Ambulatory Visit (INDEPENDENT_AMBULATORY_CARE_PROVIDER_SITE_OTHER): Payer: 59 | Admitting: Family Medicine

## 2019-07-12 ENCOUNTER — Encounter: Payer: Self-pay | Admitting: Family Medicine

## 2019-07-12 ENCOUNTER — Other Ambulatory Visit: Payer: Self-pay

## 2019-07-12 VITALS — BP 120/77 | HR 69 | Temp 98.0°F | Ht 71.0 in | Wt 190.0 lb

## 2019-07-12 DIAGNOSIS — E119 Type 2 diabetes mellitus without complications: Secondary | ICD-10-CM

## 2019-07-12 DIAGNOSIS — K7469 Other cirrhosis of liver: Secondary | ICD-10-CM

## 2019-07-12 DIAGNOSIS — R7989 Other specified abnormal findings of blood chemistry: Secondary | ICD-10-CM

## 2019-07-12 DIAGNOSIS — I10 Essential (primary) hypertension: Secondary | ICD-10-CM

## 2019-07-12 DIAGNOSIS — H53453 Other localized visual field defect, bilateral: Secondary | ICD-10-CM

## 2019-07-12 MED ORDER — ELETRIPTAN HYDROBROMIDE 20 MG PO TABS: 20.0000 mg | ORAL_TABLET | ORAL | 5 refills | Status: DC | PRN

## 2019-07-12 NOTE — Patient Instructions (Signed)
° ° ° °  If you have lab work done today you will be contacted with your lab results within the next 2 weeks.  If you have not heard from us then please contact us. The fastest way to get your results is to register for My Chart. ° ° °IF you received an x-ray today, you will receive an invoice from [facility]. Please contact [facility] at [phone] with questions or concerns regarding your invoice.  ° °IF you received labwork today, you will receive an invoice from [facility]. Please contact [facility] at [phone] with questions or concerns regarding your invoice.  ° °Our billing staff will not be able to assist you with questions regarding bills from these companies. ° °You will be contacted with the lab results as soon as they are available. The fastest way to get your results is to activate your My Chart account. Instructions are located on the last page of this paperwork. If you have not heard from us regarding the results in 2 weeks, please contact this office. °  ° ° ° °

## 2019-07-12 NOTE — Progress Notes (Signed)
1/18/20218:47 AM  Sharon Seller 01/16/1961, 59 y.o., male [id number]  Chief Complaint  Patient presents with  . Follow-up    follow up with eye visit and htn    HPI:   Patient is a 59 y.o. male with past medical history significant for DM2, HTN, chrons disease with cirrhosis, anxiety, depression, seizureswho presents today for routine followup  Last OV July 2020 Saw neurooptho at [facility] in nov 2020 for optic neuropathy/peripheral vision loss - initial negative workup, could be 2/2 glaucoma, close followup, considering paraneoplastic labs, consider second neurology opinion at [facility] for recurrent falls and confusion  Patient overall has been doing well Sees neurophtho later this month Will be see neuro at [place] in feb He has stopped taking iron and b12 supplements as requested in july Sees GI in feb 2020 cbgs this morning 84, sometimes in 70s He does not take any more DM meds Manages DM via diet and walking However wife states that he tends to not eat for 24 hours or more at a time, and instead drinks 3-4 sodas Requesting refill of relpax which he takes for migraines  Lab Results  Component Value Date   HGBA1C 4.6 (L) 01/08/2019   HGBA1C 4.4 (L) 07/09/2018   HGBA1C 4.0 (L) 01/26/2018   Lab Results  Component Value Date   MICROALBUR 7.4 05/15/2016   LDLCALC 69 07/09/2018   CREATININE 0.88 01/08/2019   Lab Results  Component Value Date   IRON 149 01/08/2019   TIBC 279 01/08/2019   FERRITIN 778 (H) 01/08/2019   Lab Results  Component Value Date   VITAMINB12 1,733 (H) 01/08/2019   Lab Results  Component Value Date   INR 1.2 (H) 07/15/2018   INR 1.30 02/02/2018   INR 1.32 05/05/2017    Diabetic Foot Form - Detailed   Diabetic Foot Exam - detailed Can the patient see the bottom of their feet?: Yes Are the shoes appropriate in style and fit?: Yes Is there swelling or and abnormal foot shape?: No Is there a claw toe deformity?: No Is there elevated skin  temparature?: No Is there foot or ankle muscle weakness?: No Normal Range of Motion: Yes Semmes-Weinstein Monofilament Test R Site 1-Great Toe: Pos L Site 1-Great Toe: Pos        Depression screen [facility] 2/9 07/12/2019 02/01/2019 07/09/2018  Decreased Interest 0 0 0  Down, Depressed, Hopeless 0 0 0  PHQ - 2 Score 0 0 0  Altered sleeping - - -  Tired, decreased energy - - -  Change in appetite - - -  Feeling bad or failure about yourself  - - -  Trouble concentrating - - -  Moving slowly or fidgety/restless - - -  Suicidal thoughts - - -  PHQ-9 Score - - -  Difficult doing work/chores - - -  Some recent data might be hidden    Fall Risk  07/12/2019 02/01/2019 01/08/2019 07/09/2018 04/07/2018  Falls in the past year? 0 0 1 0 No  Number falls in past yr: 0 0 1 - -  Injury with Fall? 0 0 1 - -  Comment - - - - -  Risk Factor Category  - - - - -  Risk for fall due to : - - - - -  Follow up - - - - -     Allergies  Allergen Reactions  . Azithromycin Swelling    SWELLING REACTION UNSPECIFIED   . Beef-Derived Products Diarrhea    RED  MEAT > UNSPECIFIED REACTION   . Milk-Related Compounds Diarrhea    DAIRY >UNSPECIFIED REACTION   . Penicillins Hives    Rash as child- tolerates Ancef/CHILDHOOD ALLERGY Has patient had a PCN reaction causing immediate rash, facial/tongue/throat swelling, SOB or lightheadedness with hypotension: YES Has patient had a PCN reaction causing severe rash involving mucus membranes or skin necrosis: Unknown Has patient had a PCN reaction that required hospitalization: Unknown Has patient had a PCN reaction occurring within the last 10 years:Unknown If all of the above answers are "NO", then may proceed with Cephalosporin [place]  . Sulfonamide Derivatives Hives  . Dilaudid [Hydromorphone Hcl] Nausea And Vomiting    Prior to Admission medications   Medication Sig Start Date End Date Taking? Authorizing Provider  Calcium Carbonate-Vitamin D (CALCIUM 600+D PO) Take  1 tablet by mouth 2 (two) times daily.   Yes [provider]  cholecalciferol (VITAMIN D) 1000 units tablet Take 1,000 Units at bedtime by mouth.    Yes [provider]  eletriptan (RELPAX) 20 MG tablet Take 1 tablet (20 mg total) by mouth as needed for migraine or headache. May repeat in 2 hours if headache persists or recurs. 04/07/18  Yes Rutherford Guys, MD  esomeprazole (NEXIUM) 40 MG capsule TAKE 1 CAPSULE TWICE DAILY,NEED APPOINTMENT FOR       FURTHER REFILLS 04/05/19  Yes Ladene Artist, MD  eszopiclone (LUNESTA) 2 MG TABS tablet Take 2 mg by mouth at bedtime as needed for sleep. Take immediately before bedtime   Yes [provider]  folic acid (FOLVITE) 1 MG tablet Take 2 tablets (2 mg total) by mouth daily. 01/08/19  Yes Rutherford Guys, MD  glucose blood (CHOICE DM FORA G20 TEST STRIPS) test strip Use as instructed 11/13/12  Yes [name], MD  Ibuprofen-diphenhydrAMINE Cit (ADVIL PM PO) Take by mouth.   Yes [provider]  lamoTRIgine (LAMICTAL) 150 MG tablet Take 1 tablet (150 mg total) by mouth 2 (two) times daily. 07/01/19  Yes Lomax, Amy, NP  LATANOPROST OP Apply to eye.   Yes [provider]  mercaptopurine (PURINETHOL) 50 MG tablet TAKE 2 TABLETS DAILY ON AN EMPTY STOMACH 1 HOUR BEFORE OR 2 HOURS AFTER MEAL (CAUTION CHEMOTHERAPY). MAKE AN APPOINTMENT FOR FURTHER REFILLS 04/05/19  Yes Ladene Artist, MD  mesalamine (LIALDA) 1.2 g EC tablet TAKE 2 TABLETS (2.4GM)     DAILY WITH BREAKFAST.      NEEDS APPOINTMENT FOR      FURTHER REFILLS 04/05/19  Yes Ladene Artist, MD  methscopolamine (PAMINE FORTE) 5 MG tablet TAKE 1 TABLET TWICE A DAY. NEEDS APPOINTMENT FOR      FURTHER REFILLS 04/05/19  Yes Ladene Artist, MD  ONE TOUCH ULTRA TEST test strip USE AS INSTRUCTED   Yes Weber, Sarah L, PA-C  potassium chloride SA (K-DUR) 20 MEQ tablet Take 1 tablet (20 mEq total) by mouth 2 (two) times daily. 01/08/19  Yes Rutherford Guys, MD   Probiotic Product (PROBIOTIC PO) Take 1 tablet at bedtime by mouth.    Yes [provider]  sertraline (ZOLOFT) 100 MG tablet Take 200 mg daily with breakfast by mouth.  03/16/17  Yes [provider]  traZODone (DESYREL) 100 MG tablet  05/28/18  Yes [provider]  XIFAXAN 550 MG TABS tablet TAKE 1 TABLET TWICE A DAY 04/05/19  Yes Ladene Artist, MD    Past Medical History:  Diagnosis Date  . Adenomatous colon polyp 10/1999  .  Anxiety   . Arthritis    neck, yoga helps.  . At high risk for falls    due to Seizure disorder  . Cancer ([place])    skin  . Cataract   . Complex partial seizure ([place])    last seizure was 08-30-2018  . Crohn's disease of small and large intestines ([place]) 1999  . Depression   . Diabetes mellitus    no meds at this time 10-24-17  . Esophageal stricture   . External hemorrhoids   . GERD (gastroesophageal reflux disease)   . History of kidney stones    3 large stones still present  . Hyperlipemia   . Hypertension    resolved with weight loss   . Migraines   . MRSA (methicillin resistant Staphylococcus aureus) 10/2010   . Osteoporosis   . PONV (postoperative nausea and vomiting)   . PTSD (post-traumatic stress disorder)   . Renal cyst, acquired, left 07/08/2017  . Renal lesion 05/28/2017  . Seizures ([place])    last seizure was yesterday per pt in [place]- has seizures at [place] 6 days a week   . Skin cancer    squamous cell multiple; [facility]; followed every 3 months.  . Sleep apnea    CPAP machine, uses nightly  . Staphylococcus aureus bacteremia with sepsis ([place]) 05/28/2017    Past Surgical History:  Procedure Laterality Date  . CATARACT EXTRACTION Bilateral   . COLONOSCOPY    . ELBOW SURGERY  2012   elbow MRSA infection   . EYE SURGERY     cataracts removed, /w IOL  . INSERTION OF MESH N/A 07/01/2016   Procedure: INSERTION OF MESH;  Surgeon: [name], MD;  Location: [facility];  Service: General;  Laterality: N/A;  . IR  URETERAL STENT LEFT NEW ACCESS W/O SEP NEPHROSTOMY CATH  02/02/2018  . NEPHROLITHOTOMY Left 02/02/2018   Procedure: NEPHROLITHOTOMY PERCUTANEOUS;  Surgeon: [name], MD;  Location: [facility];  Service: Urology;  Laterality: Left;  . POLYPECTOMY    . SCALP LACERATION REPAIR Right 10/16/2017   From fall/staples  . SHOULDER ARTHROSCOPY Right 03/05/2018   Procedure: ARTHROSCOPY SHOULDER AND OPEN DISTAL CLAVICLE EXCISION;  Surgeon: [name], MD;  Location: [facility];  Service: Orthopedics;  Laterality: Right;  . SHOULDER SURGERY    . SINUS SURGERY WITH INSTATRAK    . TEE WITHOUT CARDIOVERSION N/A 05/09/2017   Procedure: TRANSESOPHAGEAL ECHOCARDIOGRAM (TEE);  Surgeon: [name], MD;  Location: [facility]OSCOPY;  Service: Cardiovascular;  Laterality: N/A;  . TRANSTHORACIC ECHOCARDIOGRAM  02/22/2011   EF 55-65%; increased pattern of LVH with mild conc hypertrophy, abnormal relaxation & increased filling pressure (grade 2 diastolic dysfunction); atrial septum thickened (lipomatous hypertrophy)  . UMBILICAL HERNIA REPAIR N/A 07/01/2016   Procedure: UMBILICAL HERNIA REPAIR WITH MESH;  Surgeon: [name], MD;  Location: [facility];  Service: General;  Laterality: N/A;  . UPPER GASTROINTESTINAL ENDOSCOPY    . VASECTOMY      Social History   Tobacco Use  . Smoking status: Never Smoker  . Smokeless tobacco: Never Used  Substance Use Topics  . Alcohol use: No    Family History  Problem Relation Age of Onset  . Colon polyps Father   . Stroke Father   . Hyperlipidemia Father   . Hypertension Father   . Heart disease Mother        CABG at age 58  . Hyperlipidemia Mother   . Hypertension Mother   . Stroke Paternal Grandmother   .  Stroke Paternal Grandfather   . Other Child        tetrology of fallot (cornealia deland syndrome)  . Colon cancer Neg Hx   . Esophageal cancer Neg Hx   . Stomach cancer Neg Hx   . Rectal cancer Neg Hx     Review of Systems  Constitutional: Negative for chills  and fever.  Respiratory: Negative for cough and shortness of breath.   Cardiovascular: Negative for chest pain, palpitations and leg swelling.  Gastrointestinal: Negative for abdominal pain, nausea and vomiting.   Per hpi  OBJECTIVE:  Today's Vitals   07/12/19 0830  BP: 120/77  Pulse: 69  Temp: 98 F (36.7 C)  SpO2: 96%  Weight: 190 lb (86.2 kg)  Height: 5' 11"  (1.803 m)   Body mass index is 26.5 kg/m.  Wt Readings from Last 3 Encounters:  07/12/19 190 lb (86.2 kg)  07/01/19 192 lb 12.8 oz (87.5 kg)  01/08/19 185 lb 9.6 oz (84.2 kg)    Physical Exam Vitals and nursing note reviewed.  Constitutional:      Appearance: He is well-developed.  HENT:     Head: Normocephalic and atraumatic.  Eyes:     Conjunctiva/sclera: Conjunctivae normal.     Pupils: Pupils are equal, round, and reactive to light.  Cardiovascular:     Rate and Rhythm: Normal rate and regular rhythm.     Heart sounds: No murmur. No friction rub. No gallop.   Pulmonary:     Effort: Pulmonary effort is normal.     Breath sounds: Normal breath sounds. No wheezing or rales.  Musculoskeletal:     Cervical back: Neck supple.  Skin:    General: Skin is warm and dry.  Neurological:     Mental Status: He is alert and oriented to person, place, and time.     No results found for this or any previous visit (from the past 24 hour(s)).  No results found.   ASSESSMENT and PLAN  1. Type 2 diabetes mellitus without complication, without long-term current use of insulin (HCC) Diet controlled. Pending a1c. Discussed importance of healthy eating, not skipping meals. Discussed effects on his gait/balance/fall risk. - HM DIABETES FOOT EXAM - Hemoglobin A1c - Vitamin B12  2. Essential hypertension Controlled. Continue current regime.   3. Elevated ferritin Rechecking labs now that he is off iron supplements.  - CMET with GFR - Ferritin - CBC  4. Other cirrhosis of liver ([place]) 2/2 chrons. Managed by GI.  If persist elevated ferritin, it might be contributing - Protime-INR - Ammonia  5. Decreased peripheral vision of both eyes Etiology not clear. Managed by neuro ophtho, contributing to frequent falls.   Other orders - eletriptan (RELPAX) 20 MG tablet; Take 1 tablet (20 mg total) by mouth as needed for migraine or headache. May repeat in 2 hours if headache persists or recurs.  Return in about 6 months (around 01/09/2020).    Rutherford Guys, MD Primary Care at [place], [place] [address] Ph.  [phone] Fax [phone]

## 2019-07-13 LAB — CBC
Hematocrit: 34.6 % — ABNORMAL LOW (ref 37.5–51.0)
Hemoglobin: 12.3 g/dL — ABNORMAL LOW (ref 13.0–17.7)
MCH: 38.2 pg — ABNORMAL HIGH (ref 26.6–33.0)
MCHC: 35.5 g/dL (ref 31.5–35.7)
MCV: 108 fL — ABNORMAL HIGH (ref 79–97)
Platelets: 90 10*3/uL — CL (ref 150–450)
RBC: 3.22 x10E6/uL — ABNORMAL LOW (ref 4.14–5.80)
RDW: 14.8 % (ref 11.6–15.4)
WBC: 3.9 10*3/uL (ref 3.4–10.8)

## 2019-07-13 LAB — AMMONIA: Ammonia: 150 ug/dL (ref 40–200)

## 2019-07-13 LAB — CMP14+EGFR
ALT: 23 IU/L (ref 0–44)
AST: 32 IU/L (ref 0–40)
Albumin/Globulin Ratio: 2.3 — ABNORMAL HIGH (ref 1.2–2.2)
Albumin: 4.5 g/dL (ref 3.8–4.9)
Alkaline Phosphatase: 131 IU/L — ABNORMAL HIGH (ref 39–117)
BUN/Creatinine Ratio: 14 (ref 9–20)
BUN: 13 mg/dL (ref 6–24)
Bilirubin Total: 2.5 mg/dL — ABNORMAL HIGH (ref 0.0–1.2)
CO2: 22 mmol/L (ref 20–29)
Calcium: 9.3 mg/dL (ref 8.7–10.2)
Chloride: 109 mmol/L — ABNORMAL HIGH (ref 96–106)
Creatinine, Ser: 0.93 mg/dL (ref 0.76–1.27)
GFR calc Af Amer: 105 mL/min/{1.73_m2} (ref >59)
GFR calc non Af Amer: 91 mL/min/{1.73_m2} (ref >59)
Globulin, Total: 2 g/dL (ref 1.5–4.5)
Glucose: 85 mg/dL (ref 65–99)
Potassium: 3.9 mmol/L (ref 3.5–5.2)
Sodium: 145 mmol/L — ABNORMAL HIGH (ref 134–144)
Total Protein: 6.5 g/dL (ref 6.0–8.5)

## 2019-07-13 LAB — HEMOGLOBIN A1C
Est. average glucose Bld gHb Est-mCnc: 80 mg/dL
Hgb A1c MFr Bld: 4.4 % — ABNORMAL LOW (ref 4.8–5.6)

## 2019-07-13 LAB — VITAMIN B12: Vitamin B-12: 1062 pg/mL (ref 232–1245)

## 2019-07-13 LAB — PROTIME-INR
INR: 1.2 (ref 0.9–1.2)
Prothrombin Time: 12.5 s — ABNORMAL HIGH (ref 9.1–12.0)

## 2019-07-13 LAB — FERRITIN: Ferritin: 692 ng/mL — ABNORMAL HIGH (ref 30–400)

## 2019-07-21 DIAGNOSIS — H53483 Generalized contraction of visual field, bilateral: Secondary | ICD-10-CM | POA: Diagnosis not present

## 2019-07-21 DIAGNOSIS — H469 Unspecified optic neuritis: Secondary | ICD-10-CM | POA: Diagnosis not present

## 2019-07-21 DIAGNOSIS — R4189 Other symptoms and signs involving cognitive functions and awareness: Secondary | ICD-10-CM | POA: Diagnosis not present

## 2019-07-21 DIAGNOSIS — R42 Dizziness and giddiness: Secondary | ICD-10-CM | POA: Diagnosis not present

## 2019-07-21 DIAGNOSIS — Z961 Presence of intraocular lens: Secondary | ICD-10-CM | POA: Diagnosis not present

## 2019-07-26 DIAGNOSIS — C44629 Squamous cell carcinoma of skin of left upper limb, including shoulder: Secondary | ICD-10-CM | POA: Diagnosis not present

## 2019-07-26 DIAGNOSIS — C44321 Squamous cell carcinoma of skin of nose: Secondary | ICD-10-CM | POA: Diagnosis not present

## 2019-07-27 ENCOUNTER — Encounter: Payer: Self-pay | Admitting: Gastroenterology

## 2019-07-27 ENCOUNTER — Ambulatory Visit (INDEPENDENT_AMBULATORY_CARE_PROVIDER_SITE_OTHER): Payer: 59 | Admitting: Gastroenterology

## 2019-07-27 ENCOUNTER — Telehealth: Payer: Self-pay | Admitting: Gastroenterology

## 2019-07-27 VITALS — Ht 71.0 in | Wt 190.0 lb

## 2019-07-27 DIAGNOSIS — K7460 Unspecified cirrhosis of liver: Secondary | ICD-10-CM

## 2019-07-27 DIAGNOSIS — K580 Irritable bowel syndrome with diarrhea: Secondary | ICD-10-CM | POA: Diagnosis not present

## 2019-07-27 DIAGNOSIS — K5080 Crohn's disease of both small and large intestine without complications: Secondary | ICD-10-CM | POA: Diagnosis not present

## 2019-07-27 MED ORDER — METHSCOPOLAMINE BROMIDE 5 MG PO TABS: ORAL_TABLET | ORAL | 3 refills | Status: DC

## 2019-07-27 MED ORDER — ESOMEPRAZOLE MAGNESIUM 40 MG PO CPDR: DELAYED_RELEASE_CAPSULE | ORAL | 3 refills | Status: DC

## 2019-07-27 MED ORDER — RIFAXIMIN 550 MG PO TABS: 550.0000 mg | ORAL_TABLET | Freq: Two times a day (BID) | ORAL | 3 refills | Status: DC

## 2019-07-27 MED ORDER — MESALAMINE 1.2 G PO TBEC: DELAYED_RELEASE_TABLET | ORAL | 3 refills | Status: DC

## 2019-07-27 NOTE — Telephone Encounter (Signed)
Informed patient's wife about the plan after todays virtual visit. Patient's wife verbalized understanding of appt of ultrasound and prescriptions sent to the pharmacy. Also let her know all info is on my chart.

## 2019-07-27 NOTE — Telephone Encounter (Signed)
Left a message for patient to return my call. 

## 2019-07-27 NOTE — Patient Instructions (Signed)
We have sent the following prescriptions to your mail in pharmacy: pamine forte, Nexium, Lialda, and Xifaxan.   If you have not heard from your mail in pharmacy within 1 week or if you have not received your medication in the mail, please contact us at [phone] so we may find out why.  Stop taking mercaptopurine.   You can take over the counter Imodium three times a day as needed for diarrhea.   You have been scheduled for an abdominal ultrasound at [facility] (1st floor of hospital) on 08/05/19 at 8:00am. Please arrive 15 minutes prior to your appointment for registration. Make certain not to have anything to eat or drink 6 hours prior to your appointment. Should you need to reschedule your appointment, please contact radiology at [phone]. This test typically takes about 30 minutes to perform.  Due to recent changes in healthcare laws, you may see the results of your imaging and laboratory studies on MyChart before your provider has had a chance to review them.  We understand that in some cases there may be results that are confusing or concerning to you. Not all laboratory results come back in the same time frame and the provider may be waiting for multiple results in order to interpret others.  Please give [place] 48 hours in order for your provider to thoroughly review all the results before contacting the office for clarification of your results.    Thank you for choosing me and [facility].  Pricilla Riffle. Dagoberto Ligas., MD., Marval Regal

## 2019-07-27 NOTE — Progress Notes (Signed)
    History of Present Illness: This is a 59 year old male with Crohn's ileocolitis and cirrhosis.  He relates that his symptoms are stable. Most days he notes 1-2 looser bowel movements per day however some days to 5 loose bowel movements per day.  Reflux symptoms are well controlled.  His weight is stable about 180 and appetite is good.  He states he is walking about 5 miles per day with a new puppy.  Blood work reviewed from mid January.  Colonoscopy May 2020 - The examined portion of the ileum was normal. Biopsied. - Three 6 to 7 mm polyps in the descending colon and in the ascending colon, removed with a cold snare. Resected and retrieved. - Internal hemorrhoids. - Mild diverticulosis in the left colon. There was no evidence of diverticular bleeding. Path: Normal terminal ileum, normal random colon, tubular adenomas  Current Medications, Allergies, Past Medical History, Past Surgical History, Family History and Social History were reviewed in [employer] record.   Limited Physical Exam: Virtual visit General: Well developed, well nourished, no acute distress Head: Normocephalic and atraumatic Eyes:  sclerae anicteric, EOMI Ears: Normal auditory acuity Psychological:  Alert and cooperative. Normal mood and affect   Assessment and Recommendations:  1.  Crohn's ileocolitis. IBS-D.  Frequent episodic diarrhea not controlled with Pamine Forte.  Likely IBS-D, food intolerances.  Begin Imodium 3 times daily as needed.  Avoid foods that trigger diarrhea.  He wants to discuss discontinuing 6-MP as he has been on it for many years and he is concerned about long term side effects.  His most recent colonoscopy to the [facility] did not show evidence of active Crohn's disease.  After discussion of the risks and benefits he would like to discontinue 6-MP.  Continue Lialda 2.4 g daily.   2.  Cirrhosis presumed secondary to NASH.  Mild HE appears to be controlled.  Continue Xifaxan 550  mg twice daily.  Schedule RUQ [place] for [place] screening.   3.  GERD.  Follow antireflux measures and continue Nexium 40 mg po twice daily.  4.  Personal history of adenomatous colon polyps.  Colitis surveillance.  Colonoscopy in May 2025.   These services were provided via telemedicine, audio and video.  The patient was at home and the provider was in the office, alone.  We discussed the limitations of evaluation and management by telemedicine and the availability of in person appointments.  Patient consented for this telemedicine visit and is aware of possible charges for this service.  Office CMA  participated in this telemedicine service.  Time spent on call: 17 minutes

## 2019-08-05 ENCOUNTER — Ambulatory Visit (HOSPITAL_COMMUNITY): Payer: 59

## 2019-08-10 ENCOUNTER — Other Ambulatory Visit: Payer: Self-pay

## 2019-08-10 ENCOUNTER — Ambulatory Visit (HOSPITAL_COMMUNITY)
Admission: RE | Admit: 2019-08-10 | Discharge: 2019-08-10 | Disposition: A | Discharge status: Home / Self Care | Payer: 59 | Source: Ambulatory Visit | Attending: Gastroenterology | Admitting: Gastroenterology

## 2019-08-10 DIAGNOSIS — K7460 Unspecified cirrhosis of liver: Secondary | ICD-10-CM

## 2019-08-10 DIAGNOSIS — K7030 Alcoholic cirrhosis of liver without ascites: Secondary | ICD-10-CM | POA: Diagnosis not present

## 2019-08-12 IMAGING — CT CT CERVICAL SPINE W/O CM
4 of 7 series · 13 of 33 positions shown, 14 images · non-contrast
Comparison: MRI 11/01/2015.  CT 01/05/2013.

CLINICAL DATA: Struck head on furniture with subsequent headache.
Laceration on the right with bleeding.

EXAM:
CT HEAD WITHOUT CONTRAST
CT CERVICAL SPINE WITHOUT CONTRAST
TECHNIQUE: Multidetector CT imaging of the head and cervical spine was
performed following the standard protocol without intravenous
contrast. Multiplanar CT image reconstructions of the cervical spine
were also generated.

[Series 9: c spine soft · axial · 0.37mm/px · z∈[+1274,+1350]mm · 3 of 97 slices shown]
[im 20/97  soft-tissue]
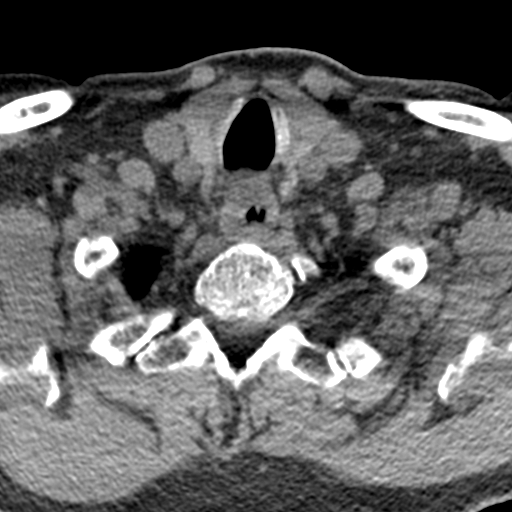
[im 39/97  soft-tissue]
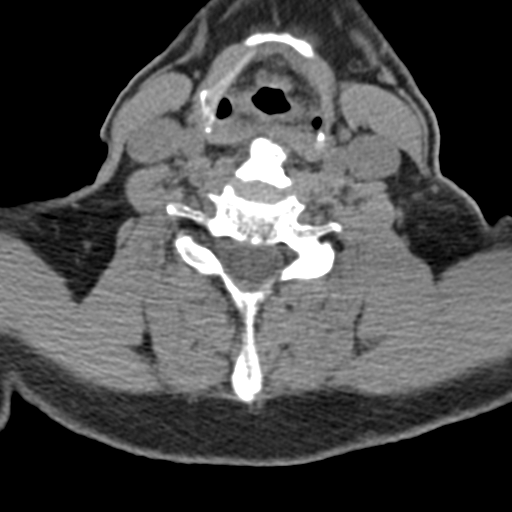
[im 58/97  soft-tissue]
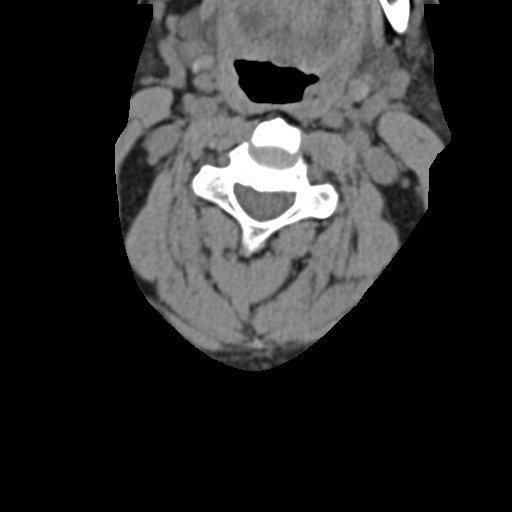

[Series 10: orthogonal bone · axial · 0.22mm/px · z∈[+1254,+1376]mm · 4 of 110 slices shown, 5 images]
[im 22/110  soft-tissue]
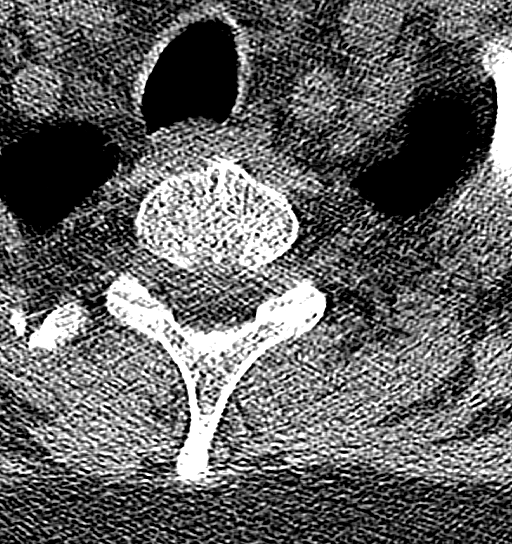
[im 22/110  bone]
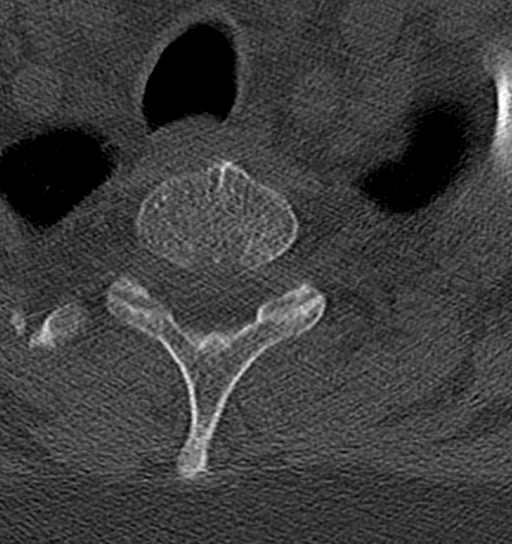
[im 44/110  bone]
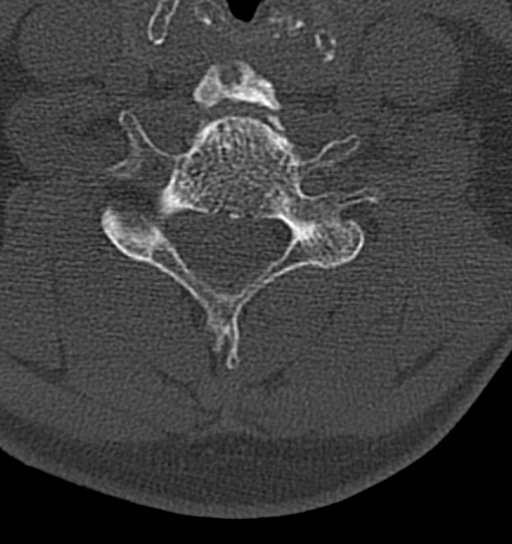
[im 66/110  bone]
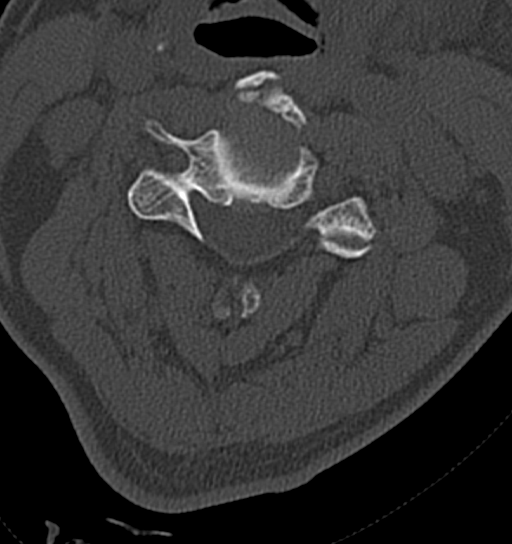
[im 88/110  bone]
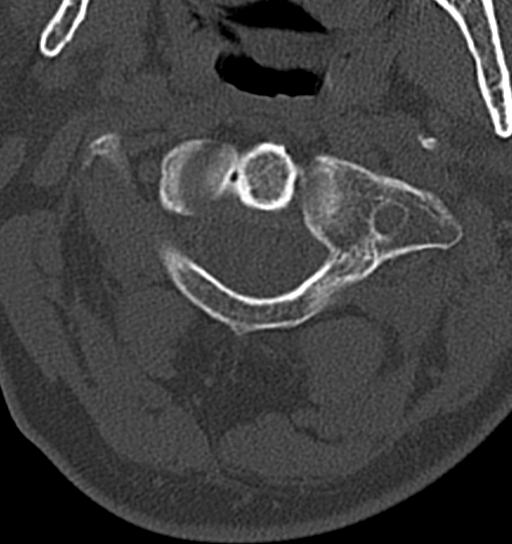

[Series 11: coronal bone · coronal · 0.22mm/px · 1 of 61 slices shown]
[im 31/61  bone]
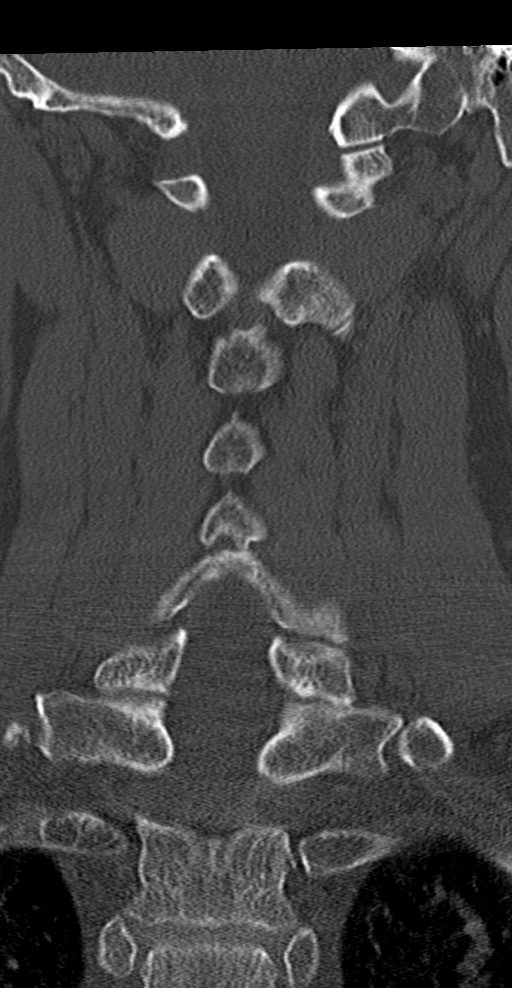

[Series 12: sagittal bone · sagittal · 0.23mm/px · 5 of 57 slices shown]
[im 10/57  bone]
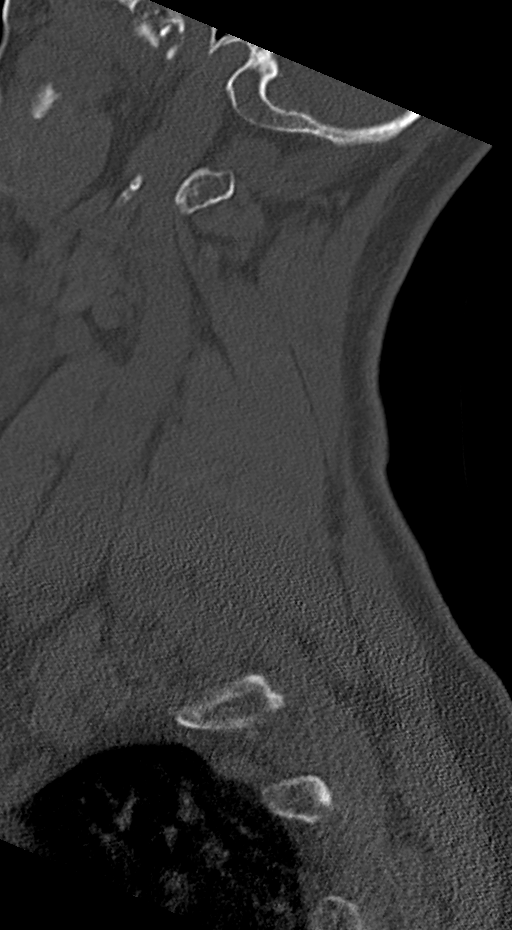
[im 19/57  bone]
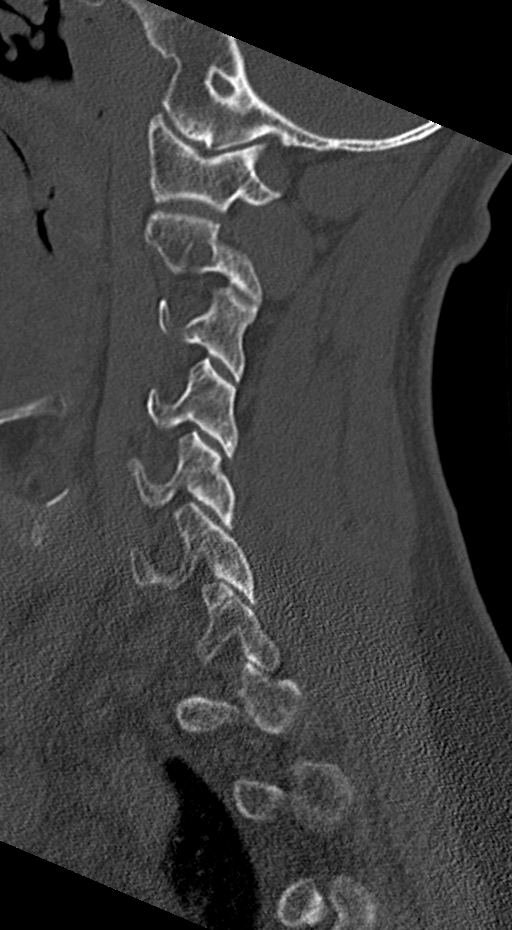
[im 29/57  bone]
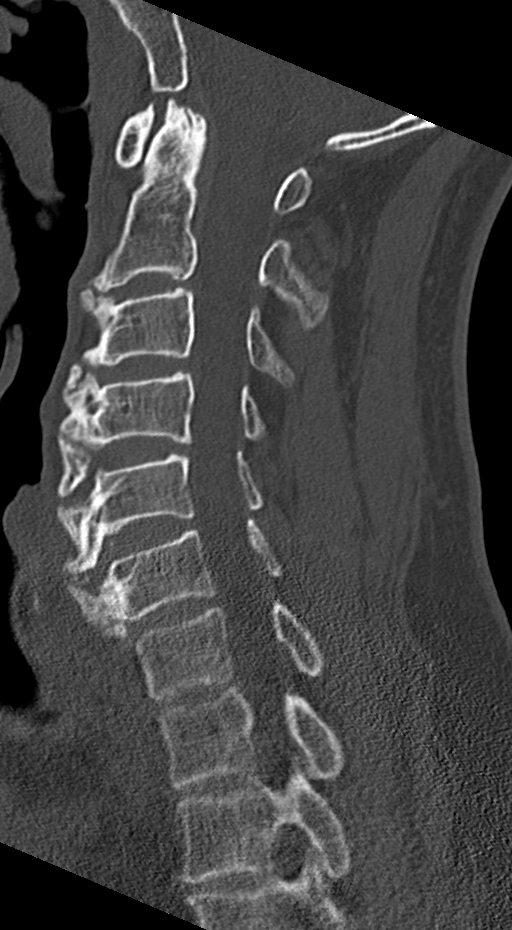
[im 38/57  bone]
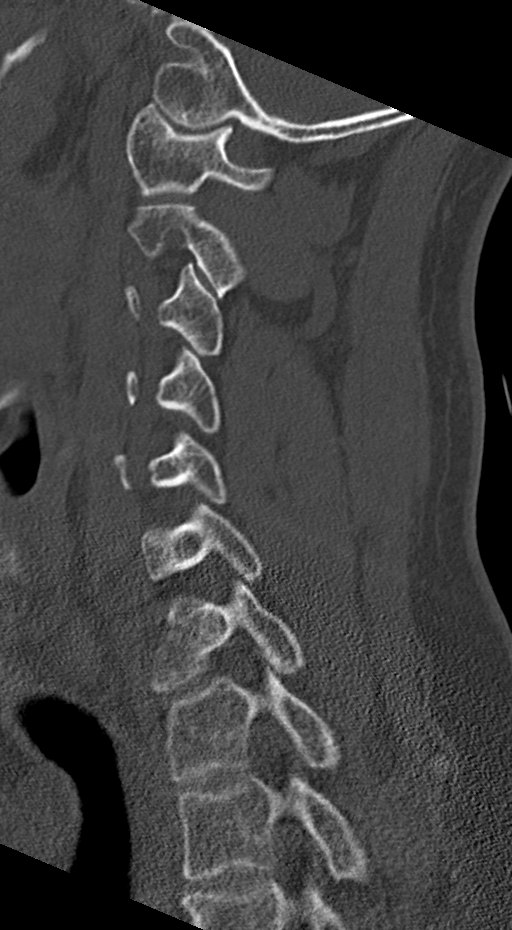
[im 47/57  bone]
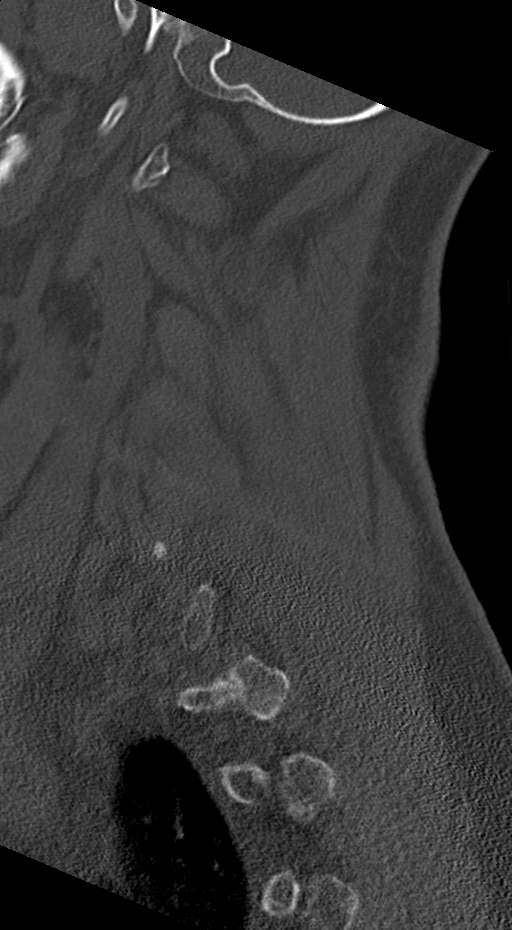

[13 of 33 positions shown; findings below may reference images not displayed]

FINDINGS: CT HEAD FINDINGS

Brain: Mild generalized atrophy. No evidence of old or acute focal
infarction, mass lesion, hemorrhage, hydrocephalus or extra-axial
collection.

Vascular: No abnormal vascular finding.

Skull: Negative

Sinuses/Orbits: Clear presently. Previous functional endoscopic
sinus surgery. Orbits negative.

Other: None

CT CERVICAL SPINE FINDINGS

Alignment: No traumatic malalignment.

Skull base and vertebrae: No fracture or primary bone lesion.

Soft tissues and spinal canal: No soft tissue lesions seen.

Disc levels: Prominent anterior osteophytes which could be
associated with dysphagia. Ordinary osteoarthritis at the C1-2
articulation. Small uncovertebral osteophytes at C3-4 and C4-5
without significant canal or foraminal encroachment.

Upper chest: Negative

Other: None
IMPRESSION: Head CT: Mild generalized atrophy.  No acute or traumatic finding.

Cervical spine CT: No acute or traumatic finding. Prominent anterior
osteophytes which can occasionally be associated with dysphagia.

## 2019-08-12 IMAGING — CT CT ANGIO NECK
3 of 7 series · 6 of 16 positions shown · IV contrast (ISOVUE)
Comparison: None.

CLINICAL DATA: Fell and hit head on the nightstand.

EXAM:
CT ANGIOGRAPHY NECK
TECHNIQUE: Multidetector CT imaging of the neck was performed using the
standard protocol during bolus administration of intravenous
contrast. Multiplanar CT image reconstructions and MIPs were
obtained to evaluate the vascular anatomy. Carotid stenosis
measurements (when applicable) are obtained utilizing NASCET
criteria, using the distal internal carotid diameter as the
denominator.
CONTRAST:  80mL 66FFGD-GFG IOPAMIDOL (66FFGD-GFG) INJECTION 76%

[Series 8: ax thin · axial · 0.44mm/px · z∈[+1230,+1372]mm · 3 of 298 slices shown]
[im 75/298  soft-tissue]
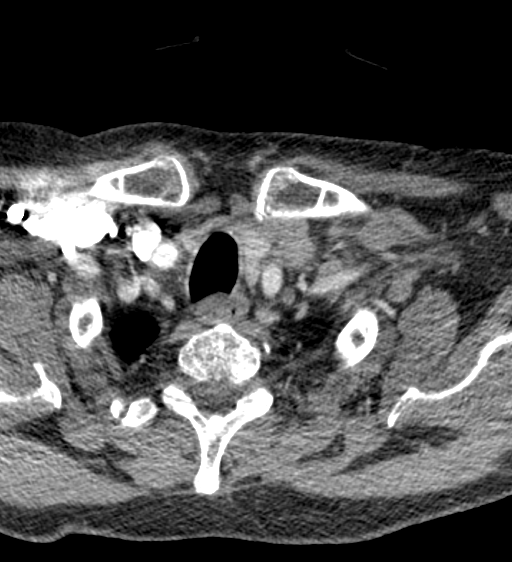
[im 149/298  bone]
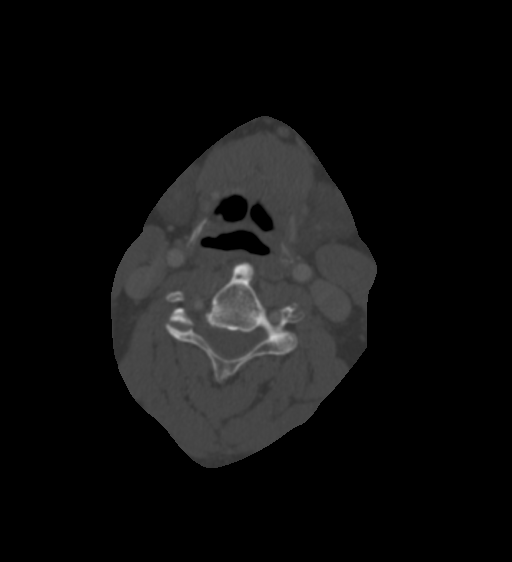
[im 223/298  soft-tissue]
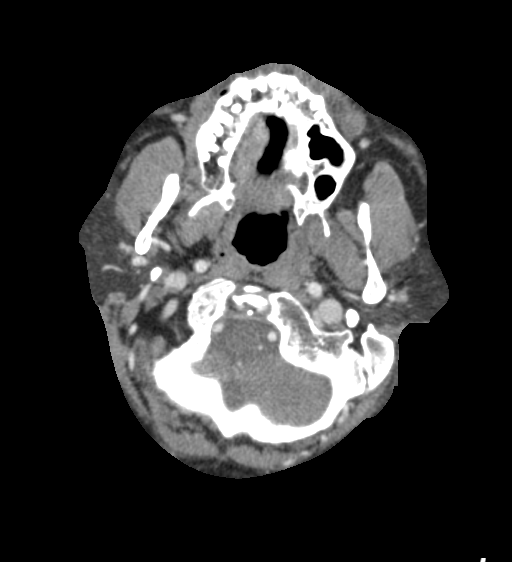

[Series 9: coronal thin · coronal · 0.44mm/px · 2 of 247 slices shown]
[im 83/247  soft-tissue]
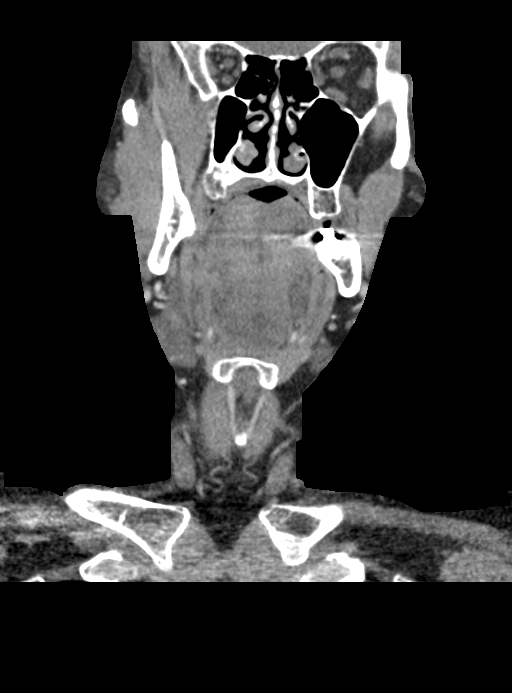
[im 165/247  soft-tissue]
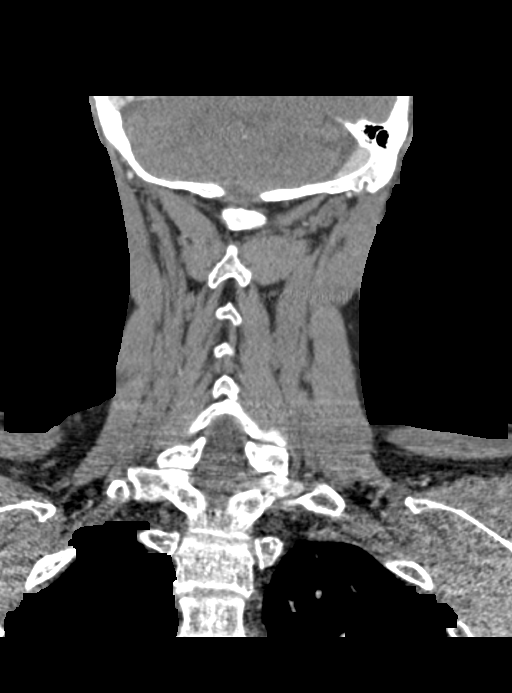

[Series 10: sagittal thin · sagittal · 0.48mm/px · 1 of 225 slices shown]
[im 113/225  soft-tissue]
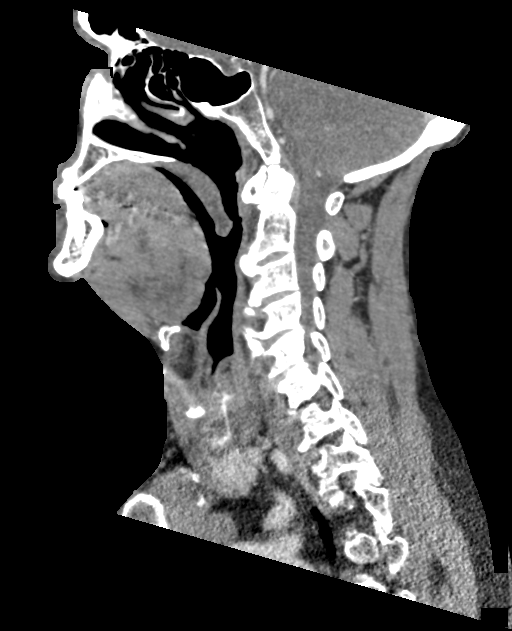

[6 of 16 positions shown; findings below may reference images not displayed]

FINDINGS: Aortic arch: Normal

Right carotid system: Common carotid artery widely patent to the
bifurcation. Carotid bifurcation is normal without soft or calcified
plaque. Cervical ICA is widely patent to and through the skull base.

Left carotid system: Common carotid artery widely patent to the
bifurcation region. Minimal atherosclerotic plaque at the
bifurcation but no stenosis or irregularity. Cervical ICA is widely
patent to and through the skull base.

Vertebral arteries: Both vertebral artery origins are widely patent.
Both vertebral arteries appear normal through the cervical region,
through the foramen magnum and to the basilar.

Skeleton: No acute finding.  See results of cervical spine CT.

Other neck: No soft tissue mass or finding.

Upper chest: Negative
IMPRESSION: Negative CT angiography of the neck. The study is normal with
exception of minimal atherosclerosis at the left carotid bifurcation
without stenosis or irregularity.

## 2019-08-16 ENCOUNTER — Other Ambulatory Visit: Payer: Self-pay

## 2019-08-16 MED ORDER — MERCAPTOPURINE 50 MG PO TABS: 100.0000 mg | ORAL_TABLET | Freq: Every day | ORAL | 5 refills | Status: DC

## 2019-08-18 DIAGNOSIS — R569 Unspecified convulsions: Secondary | ICD-10-CM | POA: Diagnosis not present

## 2019-08-18 DIAGNOSIS — R2689 Other abnormalities of gait and mobility: Secondary | ICD-10-CM | POA: Diagnosis not present

## 2019-08-24 DIAGNOSIS — N5201 Erectile dysfunction due to arterial insufficiency: Secondary | ICD-10-CM | POA: Diagnosis not present

## 2019-08-24 DIAGNOSIS — N200 Calculus of kidney: Secondary | ICD-10-CM | POA: Diagnosis not present

## 2019-08-25 NOTE — Progress Notes (Signed)
I have reviewed and agreed above plan. 

## 2019-09-01 ENCOUNTER — Telehealth: Payer: Self-pay | Admitting: *Deleted

## 2019-09-01 DIAGNOSIS — Z9989 Dependence on other enabling machines and devices: Secondary | ICD-10-CM | POA: Diagnosis not present

## 2019-09-01 DIAGNOSIS — F445 Conversion disorder with seizures or convulsions: Secondary | ICD-10-CM | POA: Insufficient documentation

## 2019-09-01 DIAGNOSIS — K7469 Other cirrhosis of liver: Secondary | ICD-10-CM | POA: Diagnosis not present

## 2019-09-01 DIAGNOSIS — G4733 Obstructive sleep apnea (adult) (pediatric): Secondary | ICD-10-CM | POA: Diagnosis not present

## 2019-09-01 DIAGNOSIS — R569 Unspecified convulsions: Secondary | ICD-10-CM | POA: Diagnosis not present

## 2019-09-01 NOTE — Telephone Encounter (Signed)
Schedule AWV.  

## 2019-09-03 ENCOUNTER — Telehealth: Payer: Self-pay | Admitting: *Deleted

## 2019-09-03 NOTE — Telephone Encounter (Signed)
Pt called to sch awv  [phone]  Please advise.

## 2019-09-04 ENCOUNTER — Ambulatory Visit: Payer: 59 | Attending: Internal Medicine

## 2019-09-04 ENCOUNTER — Other Ambulatory Visit: Payer: Self-pay

## 2019-09-04 DIAGNOSIS — Z23 Encounter for immunization: Secondary | ICD-10-CM

## 2019-09-04 NOTE — Progress Notes (Signed)
   Covid-19 Vaccination Clinic  Name:  [name]    MRN: [id number] DOB: 25-Apr-1961  09/04/2019  Mr. Szymborski was observed post Covid-19 immunization for 15 minutes without incident. He was provided with Vaccine Information Sheet and instruction to access the V-Safe system.   Mr. Kaupp was instructed to call 911 with any severe reactions post vaccine: Marland Kitchen Difficulty breathing  . Swelling of face and throat  . A fast heartbeat  . A bad rash all over body  . Dizziness and weakness   Immunizations Administered    Name Date Dose VIS Date Route   Pfizer COVID-19 Vaccine 09/04/2019 12:54 PM 0.3 mL 06/04/2019 Intramuscular   Manufacturer: [place]   Lot: [id number]   [place]: [id number]

## 2019-09-06 DIAGNOSIS — F332 Major depressive disorder, recurrent severe without psychotic features: Secondary | ICD-10-CM | POA: Diagnosis not present

## 2019-09-06 DIAGNOSIS — F411 Generalized anxiety disorder: Secondary | ICD-10-CM | POA: Diagnosis not present

## 2019-09-06 DIAGNOSIS — F4312 Post-traumatic stress disorder, chronic: Secondary | ICD-10-CM | POA: Diagnosis not present

## 2019-09-15 ENCOUNTER — Ambulatory Visit (INDEPENDENT_AMBULATORY_CARE_PROVIDER_SITE_OTHER): Payer: 59 | Admitting: Family Medicine

## 2019-09-15 VITALS — BP 120/77 | Ht 71.0 in | Wt 190.0 lb

## 2019-09-15 DIAGNOSIS — Z0000 Encounter for general adult medical examination without abnormal findings: Secondary | ICD-10-CM

## 2019-09-15 NOTE — Patient Instructions (Signed)
Thank you for taking time to come for your Medicare Wellness Visit. I appreciate your ongoing commitment to your health goals. Please review the following plan we discussed and let me know if I can assist you in the future.  [name] Kennedy LPN  Preventive Care 58-58 Years Old, Male Preventive care refers to lifestyle choices and visits with your health care provider that can promote health and wellness. This includes:  A yearly physical exam. This is also called an annual well check.  Regular dental and eye exams.  Immunizations.  Screening for certain conditions.  Healthy lifestyle choices, such as eating a healthy diet, getting regular exercise, not using drugs or products that contain nicotine and tobacco, and limiting alcohol use. What can I expect for my preventive care visit? Physical exam Your health care provider will check:  Height and weight. These may be used to calculate body mass index (BMI), which is a measurement that tells if you are at a healthy weight.  Heart rate and blood pressure.  Your skin for abnormal spots. Counseling Your health care provider may ask you questions about:  Alcohol, tobacco, and drug use.  Emotional well-being.  Home and relationship well-being.  Sexual activity.  Eating habits.  Work and work Statistician. What immunizations do I need?  Influenza (flu) vaccine  This is recommended every year. Tetanus, diphtheria, and pertussis (Tdap) vaccine  You may need a Td booster every 10 years. Varicella (chickenpox) vaccine  You may need this vaccine if you have not already been vaccinated. Zoster (shingles) vaccine  You may need this after age 58. Measles, mumps, and rubella (MMR) vaccine  You may need at least one dose of MMR if you were born in 1957 or later. You may also need a second dose. Pneumococcal conjugate (PCV13) vaccine  You may need this if you have certain conditions and were not previously vaccinated. Pneumococcal  polysaccharide (PPSV23) vaccine  You may need one or two doses if you smoke cigarettes or if you have certain conditions. Meningococcal conjugate (MenACWY) vaccine  You may need this if you have certain conditions. Hepatitis A vaccine  You may need this if you have certain conditions or if you travel or work in places where you may be exposed to hepatitis A. Hepatitis B vaccine  You may need this if you have certain conditions or if you travel or work in places where you may be exposed to hepatitis B. Haemophilus influenzae type b (Hib) vaccine  You may need this if you have certain risk factors. Human papillomavirus (HPV) vaccine  If recommended by your health care provider, you may need three doses over 6 months. You may receive vaccines as individual doses or as more than one vaccine together in one shot (combination vaccines). Talk with your health care provider about the risks and benefits of combination vaccines. What tests do I need? Blood tests  Lipid and cholesterol levels. These may be checked every 5 years, or more frequently if you are over 58 years old.  Hepatitis C test.  Hepatitis B test. Screening  Lung cancer screening. You may have this screening every year starting at age 58 if you have a 30-pack-year history of smoking and currently smoke or have quit within the past 15 years.  Prostate cancer screening. Recommendations will vary depending on your family history and other risks.  Colorectal cancer screening. All adults should have this screening starting at age 19 and continuing until age 58. Your health care provider may  recommend screening at age 58 if you are at increased risk. You will have tests every 1-10 years, depending on your results and the type of screening test.  Diabetes screening. This is done by checking your blood sugar (glucose) after you have not eaten for a while (fasting). You may have this done every 1-3 years.  Sexually transmitted  disease (STD) testing. Follow these instructions at home: Eating and drinking  Eat a diet that includes fresh fruits and vegetables, whole grains, lean protein, and low-fat dairy products.  Take vitamin and mineral supplements as recommended by your health care provider.  Do not drink alcohol if your health care provider tells you not to drink.  If you drink alcohol: ? Limit how much you have to 0-2 drinks a day. ? Be aware of how much alcohol is in your drink. In the [place] one drink equals one 12 oz bottle of beer (355 mL), one 5 oz glass of wine (148 mL), or one 1 oz glass of hard liquor (44 mL). Lifestyle  Take daily care of your teeth and gums.  Stay active. Exercise for at least 30 minutes on 5 or more days each week.  Do not use any products that contain nicotine or tobacco, such as cigarettes, e-cigarettes, and chewing tobacco. If you need help quitting, ask your health care provider.  If you are sexually active, practice safe sex. Use a condom or other form of protection to prevent STIs (sexually transmitted infections).  Talk with your health care provider about taking a low-dose aspirin every day starting at age 58. What's next?  Go to your health care provider once a year for a well check visit.  Ask your health care provider how often you should have your eyes and teeth checked.  Stay up to date on all vaccines. This information is not intended to replace advice given to you by your health care provider. Make sure you discuss any questions you have with your health care provider. Document Revised: 06/04/2018 Document Reviewed: 06/04/2018 Elsevier Patient Education  2020 [employer].

## 2019-09-15 NOTE — Progress Notes (Signed)
Presents today for [employer] Visit   Date of last exam: 07/12/2019  Interpreter used for this visit? No  I connected with  [name] [name] on 09/15/19 by a telephone  and verified that I am speaking with the correct person using two identifiers.   I discussed the limitations of evaluation and management by telemedicine. The patient expressed understanding and agreed to proceed.    Patient Care Team: Rutherford Guys, MD as PCP - General (Family Medicine) Kennith Center, RD as Dietitian (Family Medicine) Wilfred Curtis, [facility] as Counselor (Psychology) Corena Pilgrim, MD as Consulting Physician (Psychiatry) [name], MD as Consulting Physician (Urology) Ladene Artist, MD as Consulting Physician (Gastroenterology) Dohmeier, Asencion Partridge, MD as Consulting Physician (Neurology)   Other items to address today:   Discussed immunizations Discussed Eye/Dental Follow up scheduled 7/20 @ 8:20 Patient sees a counselor for depression  Patient is seeing a Eye doctor at [facility]    Other Screening: Last screening for diabetes: 07/12/2019 Last lipid screening: 07/09/2018  ADVANCE DIRECTIVES: Discussed: yes On File: no Materials Provided: yes  Immunization status:  Immunization History  Administered Date(s) Administered  . Influenza Nasal 02/20/2019  . Influenza Split 03/24/2013, 03/24/2014, 03/26/2016  . Influenza Whole 03/24/2012  . Influenza,inj,Quad PF,6+ Mos 02/16/2015, 04/15/2017  . Influenza-Unspecified 04/15/2017, 03/23/2018  . PFIZER SARS-COV-2 Vaccination 09/04/2019  . Pneumococcal Conjugate-13 07/07/2014  . Pneumococcal Polysaccharide-23 06/08/2007, 06/23/2017  . Tdap 05/15/2016  . Zoster 08/18/2014  . Zoster Recombinat (Shingrix) 05/19/2018, 07/20/2018     There are no preventive care reminders to display for this patient.   Functional Status Survey: Is the patient deaf or have difficulty hearing?: Yes(hearing aids bilateral) Does the patient  have difficulty seeing, even when wearing glasses/contacts?: Yes(seeing eye doctor in [place] for Glaucoma) Does the patient have difficulty concentrating, remembering, or making decisions?: Yes Does the patient have difficulty walking or climbing stairs?: No Does the patient have difficulty dressing or bathing?: No Does the patient have difficulty doing errands alone such as visiting a doctor's office or shopping?: No   6CIT Screen 09/15/2019  What Year? 0 points  What month? 0 points  What time? 0 points  Count back from 20 2 points  Months in reverse 2 points  Repeat phrase 2 points  Total Score 6        Clinical Support from 09/15/2019 in [facility] at [facility]  AUDIT-C Score  0       Home Environment:   Marital status: married x 32 years;     Lives:   lives at home with his family.    Children:  two children; no grandchildren.  Son is special needs at age 40yo; Cornelian Delaine with TOF.     Employment: unemployed; [employer] long term disability    Live in a two home Can go up stairs easily Coming down has had many falls uses arm rail go very slow.   No scattered rugs Yes grab bars / shower seat  Adequate lighting/ no clutter      Patient Active Problem List   Diagnosis Date Noted  . At high risk for falls   . S/P shoulder surgery 03/05/2018  . Excessive daytime sleepiness 02/18/2018  . Crohn's disease of colon with rectal bleeding ([place]) 02/18/2018  . Blood in stool 02/18/2018  . Iron deficiency anemia due to chronic blood loss 02/18/2018  . Iron deficiency anemia due to sideropenic dysphagia 02/18/2018  . Renal calculus, left  02/02/2018  . Fatigue 12/16/2017  . History of sepsis 12/16/2017  . Therapeutic drug monitoring 10/07/2017  . Renal cyst, acquired, left 07/08/2017  . Frequent falls 07/08/2017  . Staphylococcus aureus bacteremia with sepsis ([place]) 05/28/2017  . Renal lesion 05/28/2017  . Bacteremia due to Staphylococcus aureus 05/06/2017  . MSSA  bacteremia 05/06/2017  . Confusion 04/02/2017  . Syncope 08/30/2016  . Depression 04/30/2016  . Memory loss 04/30/2016  . OSA on CPAP 10/31/2015  . Nonepileptic episode ([place]) 04/25/2015  . Seizure ([place]) 04/04/2013  . Abnormal EKG 03/12/2013  . DOE (dyspnea on exertion) 03/12/2013  . Complex partial seizure ([place]) 04/19/2012  . DM2 (diabetes mellitus, type 2) ([place]) 09/25/2011  . Altered mental status 09/25/2011  . OTHER DYSPHAGIA 03/27/2009  . TRANSAMINASES, SERUM, ELEVATED 03/27/2009  . Cough 11/10/2008  . GERD 12/07/2007  . COLONIC POLYPS, ADENOMATOUS, HX OF 12/07/2007  . EXTERNAL HEMORRHOIDS 09/11/2007  . CROHN'S DISEASE, LARGE AND SMALL INTESTINES 09/11/2007  . OSTEOPOROSIS 09/11/2007  . HYPERLIPIDEMIA NEC/NOS 02/27/2007  . Essential hypertension 02/27/2007     Past Medical History:  Diagnosis Date  . Adenomatous colon polyp 10/1999  . Anxiety   . Arthritis    neck, yoga helps.  . At high risk for falls    due to Seizure disorder  . Cancer ([place])    skin  . Cataract   . Complex partial seizure ([place])    last seizure was 08-30-2018  . Crohn's disease of small and large intestines ([place]) 1999  . Depression   . Diabetes mellitus    no meds at this time 10-24-17  . Esophageal stricture   . External hemorrhoids   . GERD (gastroesophageal reflux disease)   . Glaucoma   . History of kidney stones    3 large stones still present  . Hyperlipemia   . Hypertension    resolved with weight loss   . Migraines   . MRSA (methicillin resistant Staphylococcus aureus) 10/2010   . Osteoporosis   . PONV (postoperative nausea and vomiting)   . PTSD (post-traumatic stress disorder)   . Renal cyst, acquired, left 07/08/2017  . Renal lesion 05/28/2017  . Seizures ([place])    last seizure was yesterday per [place] in [place]- has seizures at [place] 6 days a week   . Skin cancer    squamous cell multiple; [place]; followed every 3 months.  . Sleep apnea    CPAP machine, uses nightly  .  Staphylococcus aureus bacteremia with sepsis ([place]) 05/28/2017     Past Surgical History:  Procedure Laterality Date  . CATARACT EXTRACTION Bilateral   . COLONOSCOPY    . ELBOW SURGERY  2012   elbow MRSA infection   . EYE SURGERY     cataracts removed, /w IOL  . INSERTION OF MESH N/A 07/01/2016   Procedure: INSERTION OF MESH;  Surgeon: [name], MD;  Location: [facility];  Service: General;  Laterality: N/A;  . IR URETERAL STENT LEFT NEW ACCESS W/O SEP NEPHROSTOMY CATH  02/02/2018  . NEPHROLITHOTOMY Left 02/02/2018   Procedure: NEPHROLITHOTOMY PERCUTANEOUS;  Surgeon: [name], MD;  Location: [facility];  Service: Urology;  Laterality: Left;  . POLYPECTOMY    . SCALP LACERATION REPAIR Right 10/16/2017   From fall/staples  . SHOULDER ARTHROSCOPY Right 03/05/2018   Procedure: ARTHROSCOPY SHOULDER AND OPEN DISTAL CLAVICLE EXCISION;  Surgeon: [name], MD;  Location: [facility];  Service: Orthopedics;  Laterality: Right;  . SHOULDER SURGERY    . SINUS SURGERY  WITH INSTATRAK    . TEE WITHOUT CARDIOVERSION N/A 05/09/2017   Procedure: TRANSESOPHAGEAL ECHOCARDIOGRAM (TEE);  Surgeon: [name], MD;  [facility];  Service: Cardiovascular;  Laterality: N/A;  . TRANSTHORACIC ECHOCARDIOGRAM  02/22/2011   EF 55-65%; increased pattern of LVH with mild conc hypertrophy, abnormal relaxation & increased filling pressure (grade 2 diastolic dysfunction); atrial septum thickened (lipomatous hypertrophy)  . UMBILICAL HERNIA REPAIR N/A 07/01/2016   Procedure: UMBILICAL HERNIA REPAIR WITH MESH;  Surgeon: [name], MD;  Location: [place];  Service: General;  Laterality: N/A;  . UPPER GASTROINTESTINAL ENDOSCOPY    . VASECTOMY       Family History  Problem Relation Age of Onset  . Colon polyps Father   . Stroke Father   . Hyperlipidemia Father   . Hypertension Father   . Heart disease Mother        CABG at age 74  . Hyperlipidemia Mother   . Hypertension Mother   . Stroke Paternal  Grandmother   . Stroke Paternal Grandfather   . Other Child        tetrology of fallot (cornealia deland syndrome)  . Colon cancer Neg Hx   . Esophageal cancer Neg Hx   . Stomach cancer Neg Hx   . Rectal cancer Neg Hx      Social History   Socioeconomic History  . Marital status: Married    Spouse name: [name]  . Number of children: 2  . Years of education: college  . Highest education level: Not on file  Occupational History  . Occupation: disabled    Comment: seizure disorder with memory impairment  Tobacco Use  . Smoking status: Never Smoker  . Smokeless tobacco: Never Used  Substance and Sexual Activity  . Alcohol use: No  . Drug use: No  . Sexual activity: Yes    Birth control/protection: Surgical    Comment: 1 partner in last 12 months  Other Topics Concern  . Not on file  Social History Narrative   Marital status: married x 32 years;       Lives:   lives at home with his family.      Children:  two children; no grandchildren.  Son is special needs at age 106yo; Cornelian Delaine with TOF.       Employment: unemployed; [employer] long term disability      Tobacco: none      Alcohol: none      Drugs: none      Exercise: unable to exercise due to frequent falls   Patient has a college education.   Patient is right-handed.   Patient drinks one cup of soda daily.   Social Determinants of Health   Financial Resource Strain:   . Difficulty of Paying Living Expenses:   Food Insecurity:   . Worried About Charity fundraiser in the Last Year:   . Arboriculturist in the Last Year:   Transportation Needs:   . Film/video editor (Medical):   Marland Kitchen Lack of Transportation (Non-Medical):   Physical Activity:   . Days of Exercise per Week:   . Minutes of Exercise per Session:   Stress:   . Feeling of Stress :   Social Connections:   . Frequency of Communication with Friends and Family:   . Frequency of Social Gatherings with Friends and Family:   . Attends  Religious Services:   . Active Member of Clubs or Organizations:   .  Attends Archivist Meetings:   Marland Kitchen Marital Status:   Intimate Partner Violence:   . Fear of Current or Ex-Partner:   . Emotionally Abused:   Marland Kitchen Physically Abused:   . Sexually Abused:      Allergies  Allergen Reactions  . Azithromycin Swelling    SWELLING REACTION UNSPECIFIED   . Beef-Derived Products Diarrhea    RED MEAT > UNSPECIFIED REACTION   . Milk-Related Compounds Diarrhea    DAIRY >UNSPECIFIED REACTION   . Penicillins Hives    Rash as child- tolerates Ancef/CHILDHOOD ALLERGY Has patient had a PCN reaction causing immediate rash, facial/tongue/throat swelling, SOB or lightheadedness with hypotension: YES Has patient had a PCN reaction causing severe rash involving mucus membranes or skin necrosis: Unknown Has patient had a PCN reaction that required hospitalization: Unknown Has patient had a PCN reaction occurring within the last 10 years:Unknown If all of the above answers are "NO", then may proceed with Cephalosporin [place]  . Sulfonamide Derivatives Hives  . Dilaudid [Hydromorphone Hcl] Nausea And Vomiting     Prior to Admission medications   Medication Sig Start Date End Date Taking? Authorizing Provider  Calcium Carbonate-Vitamin D (CALCIUM 600+D PO) Take 1 tablet by mouth 2 (two) times daily.   Yes [provider]  cholecalciferol (VITAMIN D) 1000 units tablet Take 1,000 Units at bedtime by mouth.    Yes [provider]  eletriptan (RELPAX) 20 MG tablet Take 1 tablet (20 mg total) by mouth as needed for migraine or headache. May repeat in 2 hours if headache persists or recurs. 07/12/19  Yes Rutherford Guys, MD  esomeprazole (NEXIUM) 40 MG capsule TAKE 1 CAPSULE TWICE DAILY 07/27/19  Yes Ladene Artist, MD  eszopiclone (LUNESTA) 2 MG TABS tablet Take 2 mg by mouth at bedtime as needed for sleep. Take immediately before bedtime   Yes [provider]  folic acid  (FOLVITE) 1 MG tablet Take 2 tablets (2 mg total) by mouth daily. 01/08/19  Yes Rutherford Guys, MD  glucose blood (CHOICE DM FORA G20 TEST STRIPS) test strip Use as instructed 11/13/12  Yes [name], MD  Ibuprofen-diphenhydrAMINE Cit (ADVIL PM PO) Take by mouth.   Yes [provider]  lamoTRIgine (LAMICTAL) 150 MG tablet Take 1 tablet (150 mg total) by mouth 2 (two) times daily. 07/01/19  Yes Lomax, Amy, NP  LATANOPROST OP Apply to eye.   Yes [provider]  mercaptopurine (PURINETHOL) 50 MG tablet Take 2 tablets (100 mg total) by mouth daily. Give on an empty stomach 1 hour before or 2 hours after meals. Caution: Chemotherapy. 08/16/19  Yes Ladene Artist, MD  mesalamine (LIALDA) 1.2 g EC tablet TAKE 2 TABLETS (2.4GM)     DAILY WITH BREAKFAST. 07/27/19  Yes Ladene Artist, MD  methscopolamine (PAMINE FORTE) 5 MG tablet TAKE 1 TABLET TWICE A DAY. 07/27/19  Yes Ladene Artist, MD  ONE TOUCH ULTRA TEST test strip USE AS INSTRUCTED   Yes Weber, Sarah L, PA-C  potassium chloride SA (K-DUR) 20 MEQ tablet Take 1 tablet (20 mEq total) by mouth 2 (two) times daily. 01/08/19  Yes Rutherford Guys, MD  Probiotic Product (PROBIOTIC PO) Take 1 tablet at bedtime by mouth.    Yes [provider]  rifaximin (XIFAXAN) 550 MG TABS tablet Take 1 tablet (550 mg total) by mouth 2 (two) times daily. 07/27/19  Yes Ladene Artist, MD  sertraline (ZOLOFT) 100 MG tablet Take 200 mg daily  with breakfast by mouth.  03/16/17  Yes [provider]  traZODone (DESYREL) 100 MG tablet  05/28/18  Yes [provider]     Depression screen [facility] 2/9 09/15/2019 07/12/2019 02/01/2019 07/09/2018 04/07/2018  Decreased Interest 2 0 0 0 0  Down, Depressed, Hopeless - 0 0 0 0  PHQ - 2 Score 2 0 0 0 0  Altered sleeping 1 - - - -  Tired, decreased energy 2 - - - -  Change in appetite 2 - - - -  Feeling bad or failure about yourself  2 - - - -  Trouble concentrating 2 - - - -  Moving slowly  or fidgety/restless 2 - - - -  Suicidal thoughts 0 - - - -  PHQ-9 Score 13 - - - -  Difficult doing work/chores Very difficult - - - -  Some recent data might be hidden     Fall Risk  09/15/2019 07/12/2019 02/01/2019 01/08/2019 07/09/2018  Falls in the past year? 1 0 0 1 0  Number falls in past yr: 1 0 0 1 -  Comment patient has hx falls - - - -  Injury with Fall? 1 0 0 1 -  Comment - - - - -  Risk Factor Category  - - - - -  Risk for fall due to : History of fall(s) - - - -  Follow up Falls evaluation completed;Education provided - - - -      PHYSICAL EXAM: BP 120/77 Comment: taken from a previous visit  Ht 5' 11"  (1.803 m)   Wt 190 lb (86.2 kg)   BMI 26.50 kg/m    Wt Readings from Last 3 Encounters:  09/15/19 190 lb (86.2 kg)  07/27/19 190 lb (86.2 kg)  07/12/19 190 lb (86.2 kg)      Education/Counseling provided regarding diet and exercise, prevention of chronic diseases, smoking/tobacco cessation, if applicable, and reviewed "Covered Medicare Preventive Services."

## 2019-09-29 ENCOUNTER — Ambulatory Visit: Payer: 59 | Attending: Internal Medicine

## 2019-09-29 DIAGNOSIS — Z23 Encounter for immunization: Secondary | ICD-10-CM

## 2019-09-29 NOTE — Progress Notes (Signed)
   Covid-19 Vaccination Clinic  Name:  FISHEL WAMBLE    MRN: [id number] DOB: April 05, 1961  09/29/2019  Mr. Outman was observed post Covid-19 immunization for 15 minutes without incident. He was provided with Vaccine Information Sheet and instruction to access the V-Safe system.   Mr. Baty was instructed to call 911 with any severe reactions post vaccine: Marland Kitchen Difficulty breathing  . Swelling of face and throat  . A fast heartbeat  . A bad rash all over body  . Dizziness and weakness   Immunizations Administered    Name Date Dose VIS Date Route   Pfizer COVID-19 Vaccine 09/29/2019 11:41 AM 0.3 mL 06/04/2019 Intramuscular   Manufacturer: [place]   Lot: [id number]   [place]: [id number]

## 2019-10-22 ENCOUNTER — Ambulatory Visit (INDEPENDENT_AMBULATORY_CARE_PROVIDER_SITE_OTHER)
Admission: RE | Admit: 2019-10-22 | Discharge: 2019-10-22 | Disposition: A | Discharge status: Home / Self Care | Payer: 59 | Source: Ambulatory Visit

## 2019-10-22 ENCOUNTER — Telehealth: Payer: 59

## 2019-10-22 DIAGNOSIS — M25511 Pain in right shoulder: Secondary | ICD-10-CM

## 2019-10-22 DIAGNOSIS — R0789 Other chest pain: Secondary | ICD-10-CM

## 2019-10-22 MED ORDER — TIZANIDINE HCL 2 MG PO TABS: 2.0000 mg | ORAL_TABLET | Freq: Three times a day (TID) | ORAL | 0 refills | Status: DC | PRN

## 2019-10-22 MED ORDER — DICLOFENAC SODIUM 50 MG PO TBEC: 50.0000 mg | DELAYED_RELEASE_TABLET | Freq: Two times a day (BID) | ORAL | 0 refills | Status: DC

## 2019-10-22 NOTE — ED Provider Notes (Signed)
Virtual Visit via Video Note:  [name] [name]  initiated request for Telemedicine visit with [facility] Urgent [facility] team. I connected with [name] [name]  on 10/22/2019 at 5:42 PM  for a synchronized telemedicine visit using a video enabled HIPPA compliant telemedicine application. I verified that I am speaking with [name] [name]  using two identifiers. Ok Edwards, PA-C  was physically located in a [facility] Urgent care site and [name] [name] was located at a different location.   The limitations of evaluation and management by telemedicine as well as the availability of in-person appointments were discussed. Patient was informed that he  may incur a bill ( including co-pay) for this virtual visit encounter. [name] [name]  expressed understanding and gave verbal consent to proceed with virtual visit.     History of Present Illness:[name] [name]  is a 59 y.o. male presents with 10 day history of right anterior chest and shoulder pain after fall. States slipped and fell, hitting the right shoulder/chest. States has history of multiple fractures, and this feels similar. He had been treating symptoms with advil 449m Q6H with mild relief. Denies shortness of breath, deformity. Has decreased ROM of right shoulder at baseline due to history of clavicular facture, but states currently worse due to pain.   Past Medical History:  Diagnosis Date  . Adenomatous colon polyp 10/1999  . Anxiety   . Arthritis    neck, yoga helps.  . At high risk for falls    due to Seizure disorder  . [place]    [place]  . Cataract   . [place] [facility] ([facility]    last seizure was 08-30-2018  . Crohn's disease of small and large intestines ([place] 1999  . Depression   . Diabetes mellitus    no meds at this time 10-24-17  . Esophageal stricture   . External hemorrhoids   . GERD (gastroesophageal reflux disease)   . Glaucoma   . History of kidney stones    3 large stones still present  . Hyperlipemia   . Hypertension     resolved with weight loss   . Migraines   . MRSA (methicillin resistant Staphylococcus aureus) 10/2010   . Osteoporosis   . PONV (postoperative nausea and vomiting)   . PTSD (post-traumatic stress disorder)   . Renal cyst, acquired, left 07/08/2017  . Renal lesion 05/28/2017  . Seizures ([facility]    last seizure was yesterday per [place]- [place] [facility] 6 days a week   . Skin cancer    squamous cell multiple; [facility]; followed every 3 months.  . Sleep apnea    CPAP machine, uses nightly  . Staphylococcus [place] bacteremia with [place] ([place] 05/28/2017    Allergies  Allergen Reactions  . Azithromycin Swelling    SWELLING REACTION UNSPECIFIED   . Beef-Derived Products Diarrhea    RED MEAT > UNSPECIFIED REACTION   . Milk-Related Compounds Diarrhea    DAIRY >UNSPECIFIED REACTION   . Penicillins Hives    Rash as child- tolerates Ancef/CHILDHOOD ALLERGY Has patient had a PCN reaction causing immediate rash, facial/tongue/throat swelling, SOB or lightheadedness with hypotension: YES Has patient had a PCN reaction causing severe rash involving mucus membranes or skin necrosis: Unknown Has patient had a PCN reaction that required hospitalization: Unknown Has patient had a PCN reaction occurring within the last 10 years:Unknown If all of the above answers are "NO", then may proceed with [place]phalosporin [place] .  Sulfonamide Derivatives Hives  . Dilaudid [Hydromorphone Hcl] Nausea And Vomiting        Observations/Objective: General: Well appearing, nontoxic, no acute distress. Sitting comfortably. Head: Normocephalic, atraumatic Eye: No conjunctival injection, eyelid swelling. EOMI ENT: Mucus membranes moist, no lip cracking. No obvious nasal drainage. Pulm: Speaking in full sentences without difficulty. Normal effort. No respiratory distress, accessory muscle use. Chest: no obvious flail chest/deformity. MSK: decreased ROM of the shoulder.  Neuro: Normal mental status. Alert and  oriented x 3.   Assessment and Plan: Will provide NSAIDs and muscle relaxant for symptomatic management. Discussed will need to follow up with PCP if symptoms not improving. Discussed cannot rule out rotator cuff injury. Return precautions given.  Follow Up Instructions:    I discussed the assessment and treatment plan with the patient. The patient was provided an opportunity to ask questions and all were answered. The patient agreed with the plan and demonstrated an understanding of the instructions.   The patient was advised to call back or seek an in-person evaluation if the symptoms worsen or if the condition fails to improve as anticipated.  I provided 10 minutes of non-face-to-face time during this encounter.    Ok Edwards, PA-C  10/22/2019 5:42 PM         Ok Edwards, PA-C 10/22/19 1742

## 2019-10-22 NOTE — Discharge Instructions (Signed)
Start diclofenac. Do not take ibuprofen (motrin/advil)/ naproxen (aleve) while on diclofenac. Tizanidine as needed, this can make you drowsy, so do not take if you are going to drive, operate heavy machinery, or make important decisions. Ice/heat compresses as needed. Follow up with PCP/orthopedics if symptoms worsen, changes for reevaluation. If experience shortness of breath, trouble breathing, go to the ED for further evaluation.

## 2019-11-16 DIAGNOSIS — C44629 Squamous cell carcinoma of skin of left upper limb, including shoulder: Secondary | ICD-10-CM | POA: Diagnosis not present

## 2019-11-16 DIAGNOSIS — L570 Actinic keratosis: Secondary | ICD-10-CM | POA: Diagnosis not present

## 2019-11-16 DIAGNOSIS — Z85828 Personal history of other malignant neoplasm of skin: Secondary | ICD-10-CM | POA: Diagnosis not present

## 2019-11-16 DIAGNOSIS — D0422 Carcinoma in situ of skin of left ear and external auricular canal: Secondary | ICD-10-CM | POA: Diagnosis not present

## 2019-11-16 DIAGNOSIS — L853 Xerosis cutis: Secondary | ICD-10-CM | POA: Diagnosis not present

## 2019-11-16 DIAGNOSIS — C4442 Squamous cell carcinoma of skin of scalp and neck: Secondary | ICD-10-CM | POA: Diagnosis not present

## 2019-11-16 DIAGNOSIS — L814 Other melanin hyperpigmentation: Secondary | ICD-10-CM | POA: Diagnosis not present

## 2019-11-16 DIAGNOSIS — C44622 Squamous cell carcinoma of skin of right upper limb, including shoulder: Secondary | ICD-10-CM | POA: Diagnosis not present

## 2019-11-16 DIAGNOSIS — L821 Other seborrheic keratosis: Secondary | ICD-10-CM | POA: Diagnosis not present

## 2019-12-01 DIAGNOSIS — H539 Unspecified visual disturbance: Secondary | ICD-10-CM | POA: Diagnosis not present

## 2019-12-01 DIAGNOSIS — H469 Unspecified optic neuritis: Secondary | ICD-10-CM | POA: Diagnosis not present

## 2019-12-01 DIAGNOSIS — R296 Repeated falls: Secondary | ICD-10-CM | POA: Diagnosis not present

## 2019-12-01 DIAGNOSIS — Z736 Limitation of activities due to disability: Secondary | ICD-10-CM | POA: Diagnosis not present

## 2019-12-02 ENCOUNTER — Other Ambulatory Visit: Payer: Self-pay

## 2019-12-02 ENCOUNTER — Encounter (HOSPITAL_COMMUNITY): Payer: Self-pay

## 2019-12-02 ENCOUNTER — Ambulatory Visit (HOSPITAL_COMMUNITY)
Admission: EM | Admit: 2019-12-02 | Discharge: 2019-12-02 | Disposition: A | Discharge status: Home / Self Care | Payer: 59 | Attending: Family Medicine | Admitting: Family Medicine

## 2019-12-02 DIAGNOSIS — N390 Urinary tract infection, site not specified: Secondary | ICD-10-CM

## 2019-12-02 DIAGNOSIS — R310 Gross hematuria: Secondary | ICD-10-CM | POA: Insufficient documentation

## 2019-12-02 DIAGNOSIS — N3001 Acute cystitis with hematuria: Secondary | ICD-10-CM | POA: Insufficient documentation

## 2019-12-02 DIAGNOSIS — F332 Major depressive disorder, recurrent severe without psychotic features: Secondary | ICD-10-CM | POA: Diagnosis not present

## 2019-12-02 DIAGNOSIS — F4312 Post-traumatic stress disorder, chronic: Secondary | ICD-10-CM | POA: Diagnosis not present

## 2019-12-02 DIAGNOSIS — F411 Generalized anxiety disorder: Secondary | ICD-10-CM | POA: Diagnosis not present

## 2019-12-02 LAB — POCT URINALYSIS DIP (DEVICE)
Glucose, UA: NEGATIVE mg/dL
Nitrite: NEGATIVE
Protein, ur: >=300 mg/dL — AB
Specific Gravity, Urine: 1.02 (ref 1.005–1.030)
Urobilinogen, UA: 4 mg/dL — ABNORMAL HIGH (ref 0.0–1.0)
pH: 6 (ref 5.0–8.0)

## 2019-12-02 MED ORDER — CIPROFLOXACIN HCL 500 MG PO TABS: 500.0000 mg | ORAL_TABLET | Freq: Two times a day (BID) | ORAL | 0 refills | Status: DC

## 2019-12-02 NOTE — ED Triage Notes (Signed)
Pt c/o hematuria with some clots, dysuriax2 days. Pt c/o 6/10 throbbing right flank pain.

## 2019-12-04 LAB — URINE CULTURE: Culture: NO GROWTH

## 2019-12-06 DIAGNOSIS — C44229 Squamous cell carcinoma of skin of left ear and external auricular canal: Secondary | ICD-10-CM | POA: Diagnosis not present

## 2019-12-06 DIAGNOSIS — Z85828 Personal history of other malignant neoplasm of skin: Secondary | ICD-10-CM | POA: Diagnosis not present

## 2019-12-07 NOTE — ED Provider Notes (Signed)
[facility]    ASSESSMENT & PLAN:  1. Gross hematuria   2. Acute cystitis with hematuria     Begin: Meds ordered this encounter  Medications  . ciprofloxacin (CIPRO) 500 MG tablet    Sig: Take 1 tablet (500 mg total) by mouth every 12 (twelve) hours.    Dispense:  14 tablet    Refill:  0    No signs of pyelonephritis. Discussed. Urine culture sent. Will notify patient of any significant results. Discussed possibility of kidney stone but lower suspicion. May need urology f/u if not improving. .  Outlined signs and symptoms indicating need for more acute intervention. Patient verbalized understanding. After Visit Summary given.  SUBJECTIVE:  [name] [name] is a 59 y.o. male who complains of gross hematuria and dysuria. Abrupt; two days. Some R flank discomfort. No abd pain. Afebrile.    Social History   Tobacco Use  Smoking Status Never Smoker  Smokeless Tobacco Never Used    OBJECTIVE:  Vitals:   12/02/19 1757 12/02/19 1758  BP: 135/80   Pulse: 74   Resp: 16   Temp: 98.6 F (37 C)   TempSrc: Oral   SpO2: 96%   Weight:  83 kg  Height:  5' 11"  (1.803 m)   General appearance: alert; no distress HENT: oropharynx: moist Lungs: unlabored respirations Abdomen: soft Back: Mild R CVA tenderness Extremities: no edema; symmetrical with no gross deformities Skin: warm and dry Neurologic: normal gait Psychological: alert and cooperative; normal mood and affect  Labs Reviewed  POCT URINALYSIS DIP (DEVICE) - Abnormal; Notable for the following components:      Result Value   Bilirubin Urine MODERATE (*)    Ketones, ur TRACE (*)    Hgb urine dipstick LARGE (*)    Protein, ur >=300 (*)    Urobilinogen, UA 4.0 (*)    Leukocytes,Ua LARGE (*)    All other components within normal limits  URINE CULTURE    Allergies  Allergen Reactions  . Azithromycin Swelling    SWELLING REACTION UNSPECIFIED   . Beef-Derived Products Diarrhea    RED MEAT >  UNSPECIFIED REACTION   . Milk-Related Compounds Diarrhea    DAIRY >UNSPECIFIED REACTION   . Penicillins Hives    Rash as child- tolerates Ancef/CHILDHOOD ALLERGY Has patient had a PCN reaction causing immediate rash, facial/tongue/throat swelling, SOB or lightheadedness with hypotension: YES Has patient had a PCN reaction causing severe rash involving mucus membranes or skin necrosis: Unknown Has patient had a PCN reaction that required hospitalization: Unknown Has patient had a PCN reaction occurring within the last 10 years:Unknown If all of the above answers are "NO", then may proceed with Cephalosporin [place]  . Sulfonamide Derivatives Hives  . Dilaudid [Hydromorphone Hcl] Nausea And Vomiting    Past Medical History:  Diagnosis Date  . Adenomatous colon polyp 10/1999  . Anxiety   . Arthritis    neck, yoga helps.  . At high risk for falls    due to Seizure disorder  . Cancer ([place])    skin  . Cataract   . Complex partial seizure ([place])    last seizure was 08-30-2018  . Crohn's disease of small and large intestines ([place]) 1999  . Depression   . Diabetes mellitus    no meds at this time 10-24-17  . Esophageal stricture   . External hemorrhoids   . GERD (gastroesophageal reflux disease)   . Glaucoma   . History of kidney stones  3 large stones still present  . Hyperlipemia   . Hypertension    resolved with weight loss   . Migraines   . MRSA (methicillin resistant Staphylococcus aureus) 10/2010   . Osteoporosis   . PONV (postoperative nausea and vomiting)   . PTSD (post-traumatic stress disorder)   . Renal cyst, acquired, left 07/08/2017  . Renal lesion 05/28/2017  . Seizures ([place])    last seizure was yesterday per pt in [place]- has seizures [place] [facility] 6 days a week   . Skin cancer    squamous cell multiple; [facility]; followed every 3 months.  . Sleep apnea    CPAP machine, uses nightly  . Staphylococcus aureus bacteremia with sepsis ([place]) 05/28/2017   Social History    Socioeconomic History  . Marital status: Married    Spouse name: [name]  . Number of children: 2  . Years of education: college  . Highest education level: Not on file  Occupational History  . Occupation: disabled    Comment: seizure disorder with memory impairment  Tobacco Use  . Smoking status: Never Smoker  . Smokeless tobacco: Never Used  Vaping Use  . Vaping Use: Never used  Substance and Sexual Activity  . Alcohol use: No  . Drug use: No  . Sexual activity: Yes    Birth control/protection: Surgical    Comment: 1 partner in last 12 months  Other Topics Concern  . Not on file  Social History Narrative   Marital status: married x 32 years;       Lives:   lives at home with his family.      Children:  two children; no grandchildren.  Son is special needs at age 65yo; Cornelian Delaine with TOF.       Employment: unemployed; [place] [employer] long term disability      Tobacco: none      Alcohol: none      Drugs: none      Exercise: unable to exercise due to frequent falls   Patient has a college education.   Patient is right-handed.   Patient drinks one cup of soda daily.   Social Determinants of Health   Financial Resource Strain:   . Difficulty of Paying Living Expenses:   Food Insecurity:   . Worried About Charity fundraiser in the Last Year:   . Arboriculturist in the Last Year:   Transportation Needs:   . Film/video editor (Medical):   Marland Kitchen Lack of Transportation (Non-Medical):   Physical Activity:   . Days of Exercise per Week:   . Minutes of Exercise per Session:   Stress:   . Feeling of Stress :   Social Connections:   . Frequency of Communication with Friends and Family:   . Frequency of Social Gatherings with Friends and Family:   . Attends Religious Services:   . Active Member of Clubs or Organizations:   . Attends Archivist Meetings:   Marland Kitchen Marital Status:   Intimate Partner Violence:   . Fear of Current or Ex-Partner:   . Emotionally  Abused:   Marland Kitchen Physically Abused:   . Sexually Abused:    Family History  Problem Relation Age of Onset  . Colon polyps Father   . Stroke Father   . Hyperlipidemia Father   . Hypertension Father   . Heart disease Mother        CABG at age 1  . Hyperlipidemia Mother   . Hypertension  Mother   . Stroke Paternal Grandmother   . Stroke Paternal Grandfather   . Other Child        tetrology of fallot (cornealia deland syndrome)  . Colon cancer Neg Hx   . Esophageal cancer Neg Hx   . Stomach cancer Neg Hx   . Rectal cancer Neg Hx        Vanessa Kick, MD 12/07/19 [phone]

## 2019-12-23 DIAGNOSIS — K4020 Bilateral inguinal hernia, without obstruction or gangrene, not specified as recurrent: Secondary | ICD-10-CM

## 2019-12-23 HISTORY — DX: Bilateral inguinal hernia, without obstruction or gangrene, not specified as recurrent: K40.20

## 2019-12-30 ENCOUNTER — Ambulatory Visit: Payer: 59 | Admitting: Surgery

## 2019-12-31 ENCOUNTER — Ambulatory Visit: Payer: Self-pay | Admitting: Surgery

## 2019-12-31 NOTE — H&P (View-Only) (Signed)
Surgical Evaluation   HPI:  This is a very pleasant 59 year old gentleman who is here today with his wife for evaluation of an increasingly symptomatic right inguinal hernia. This has been noticeable to him for at least a year but in the last few months has become increasingly painful when it protrudes. It has been reducible to this point. Denies any associated GI symptoms or obstruction symptoms however he does have Crohn's disease and often has issues primarily related to that. Denies associated urinary symptoms, does have a history of kidney stones, staph bacteremia, well-controlled type 2 diabetes, cirrhosis (Meld 12, child's A, last platelet count 90), complex seizure disorder and memory loss which has unfortunately caused him To go on disability. He previously worked in Engineer, technical sales at [employer]. Previous abdominal surgery includes umbilical hernia repair Dr. Zella Richer in January 4401, this was complicated by hematoma.  Allergies  Allergen Reactions   Azithromycin Swelling    SWELLING REACTION UNSPECIFIED    Beef-Derived Products Diarrhea    RED MEAT > UNSPECIFIED REACTION    Milk-Related Compounds Diarrhea    DAIRY >UNSPECIFIED REACTION    Penicillins Hives    Rash as child- tolerates Ancef/CHILDHOOD ALLERGY Has patient had a PCN reaction causing immediate rash, facial/tongue/throat swelling, SOB or lightheadedness with hypotension: YES Has patient had a PCN reaction causing severe rash involving mucus membranes or skin necrosis: Unknown Has patient had a PCN reaction that required hospitalization: Unknown Has patient had a PCN reaction occurring within the last 10 years:Unknown If all of the above answers are "NO", then may proceed with Cephalosporin [place]   Sulfonamide Derivatives Hives   Dilaudid [Hydromorphone Hcl] Nausea And Vomiting    Past Medical History:  Diagnosis Date   Adenomatous colon polyp 10/1999   Anxiety    Arthritis    neck, yoga helps.   At high risk for falls     due to Seizure disorder   Cancer [facility])    skin   Cataract    Complex partial seizure ([place])    last seizure was 08-30-2018   Crohn's disease of small and large intestines ([place]) 1999   Depression    Diabetes mellitus    no meds at this time 10-24-17   Esophageal stricture    External hemorrhoids    GERD (gastroesophageal reflux disease)    Glaucoma    History of kidney stones    3 large stones still present   Hyperlipemia    Hypertension    resolved with weight loss    Migraines    MRSA (methicillin resistant Staphylococcus aureus) 10/2010    Osteoporosis    PONV (postoperative nausea and vomiting)    PTSD (post-traumatic stress disorder)    Renal cyst, acquired, left 07/08/2017   Renal lesion 05/28/2017   Seizures ([place])    last seizure was yesterday per [place] in [place]- has seizures at [place] 6 days a week    Skin cancer    squamous cell multiple; [place]; followed every 3 months.   Sleep apnea    CPAP machine, uses nightly   Staphylococcus aureus bacteremia with sepsis ([place]) 05/28/2017    Past Surgical History:  Procedure Laterality Date   CATARACT EXTRACTION Bilateral    COLONOSCOPY     ELBOW SURGERY  2012   elbow MRSA infection    EYE SURGERY     cataracts removed, /w IOL   INSERTION OF MESH N/A 07/01/2016   Procedure: INSERTION OF MESH;  Surgeon: [name], MD;  Location: [place];  Service: General;  Laterality: N/A;   IR URETERAL STENT LEFT NEW ACCESS W/O SEP NEPHROSTOMY CATH  02/02/2018   NEPHROLITHOTOMY Left 02/02/2018   Procedure: NEPHROLITHOTOMY PERCUTANEOUS;  Surgeon: [name], MD;  Location: [facility];  Service: Urology;  Laterality: Left;   POLYPECTOMY     SCALP LACERATION REPAIR Right 10/16/2017   From fall/staples   SHOULDER ARTHROSCOPY Right 03/05/2018   Procedure: ARTHROSCOPY SHOULDER AND OPEN DISTAL CLAVICLE EXCISION;  Surgeon: [name], MD;  Location: [place];  Service: Orthopedics;  Laterality: Right;   SHOULDER  SURGERY     SINUS SURGERY WITH INSTATRAK     TEE WITHOUT CARDIOVERSION N/A 05/09/2017   Procedure: TRANSESOPHAGEAL ECHOCARDIOGRAM (TEE);  Surgeon: [name], MD;  Location: [facility] ENDOSCOPY;  Service: Cardiovascular;  Laterality: N/A;   TRANSTHORACIC ECHOCARDIOGRAM  02/22/2011   EF 55-65%; increased pattern of LVH with mild conc hypertrophy, abnormal relaxation & increased filling pressure (grade 2 diastolic dysfunction); atrial septum thickened (lipomatous hypertrophy)   UMBILICAL HERNIA REPAIR N/A 07/01/2016   Procedure: UMBILICAL HERNIA REPAIR WITH MESH;  Surgeon: [name], MD;  Location: [place];  Service: General;  Laterality: N/A;   UPPER GASTROINTESTINAL ENDOSCOPY     VASECTOMY      Family History  Problem Relation Age of Onset   Colon polyps Father    Stroke Father    Hyperlipidemia Father    Hypertension Father    Heart disease Mother        CABG at age 31   Hyperlipidemia Mother    Hypertension Mother    Stroke Paternal Grandmother    Stroke Paternal Grandfather    Other Child        tetrology of fallot (cornealia deland syndrome)   Colon cancer Neg Hx    Esophageal cancer Neg Hx    Stomach cancer Neg Hx    Rectal cancer Neg Hx     Social History   Socioeconomic History   Marital status: Married    Spouse name: [name]   Number of children: 2   Years of education: college   Highest education level: Not on file  Occupational History   Occupation: disabled    Comment: seizure disorder with memory impairment  Tobacco Use   Smoking status: Never Smoker   Smokeless tobacco: Never Used  Scientific laboratory technician Use: Never used  Substance and Sexual Activity   Alcohol use: No   Drug use: No   Sexual activity: Yes    Birth control/protection: Surgical    Comment: 1 partner in last 12 months  Other Topics Concern   Not on file  Social History Narrative   Marital status: married x 32 years;       Lives:   lives at home with his  family.      Children:  two children; no grandchildren.  Son is special needs at age 28yo; Cornelian Delaine with TOF.       Employment: unemployed; [employer] long term disability      Tobacco: none      Alcohol: none      Drugs: none      Exercise: unable to exercise due to frequent falls   Patient has a college education.   Patient is right-handed.   Patient drinks one cup of soda daily.   Social Determinants of Health   Financial Resource Strain:    Difficulty of Paying Living Expenses:   Food Insecurity:    Worried About Running Out  of Food in the Last Year:    Arboriculturist in the Last Year:   Transportation Needs:    Film/video editor (Medical):    Lack of Transportation (Non-Medical):   Physical Activity:    Days of Exercise per Week:    Minutes of Exercise per Session:   Stress:    Feeling of Stress :   Social Connections:    Frequency of Communication with Friends and Family:    Frequency of Social Gatherings with Friends and Family:    Attends Religious Services:    Active Member of Clubs or Organizations:    Attends Music therapist:    Marital Status:     Current Outpatient Medications on File Prior to Visit  Medication Sig Dispense Refill   Calcium Carbonate-Vitamin D (CALCIUM 600+D PO) Take 1 tablet by mouth 2 (two) times daily.     cholecalciferol (VITAMIN D) 1000 units tablet Take 1,000 Units at bedtime by mouth.      ciprofloxacin (CIPRO) 500 MG tablet Take 1 tablet (500 mg total) by mouth every 12 (twelve) hours. 14 tablet 0   eletriptan (RELPAX) 20 MG tablet Take 1 tablet (20 mg total) by mouth as needed for migraine or headache. May repeat in 2 hours if headache persists or recurs. 10 tablet 5   esomeprazole (NEXIUM) 40 MG capsule TAKE 1 CAPSULE TWICE DAILY 180 capsule 3   eszopiclone (LUNESTA) 2 MG TABS tablet Take 2 mg by mouth at bedtime as needed for sleep. Take immediately before bedtime     folic acid  (FOLVITE) 1 MG tablet Take 2 tablets (2 mg total) by mouth daily. 180 tablet 3   glucose blood (CHOICE DM FORA G20 TEST STRIPS) test strip Use as instructed 100 each 12   Ibuprofen-diphenhydrAMINE Cit (ADVIL PM PO) Take by mouth.     lamoTRIgine (LAMICTAL) 150 MG tablet Take 1 tablet (150 mg total) by mouth 2 (two) times daily. 180 tablet 3   LATANOPROST OP Apply to eye.     mercaptopurine (PURINETHOL) 50 MG tablet Take 2 tablets (100 mg total) by mouth daily. Give on an empty stomach 1 hour before or 2 hours after meals. Caution: Chemotherapy. 60 tablet 5   mesalamine (LIALDA) 1.2 g EC tablet TAKE 2 TABLETS (2.4GM)     DAILY WITH BREAKFAST. 180 tablet 3   methscopolamine (PAMINE FORTE) 5 MG tablet TAKE 1 TABLET TWICE A DAY. 180 tablet 3   ONE TOUCH ULTRA TEST test strip USE AS INSTRUCTED 100 each 2   potassium chloride SA (K-DUR) 20 MEQ tablet Take 1 tablet (20 mEq total) by mouth 2 (two) times daily. 180 tablet 3   Probiotic Product (PROBIOTIC PO) Take 1 tablet at bedtime by mouth.      rifaximin (XIFAXAN) 550 MG TABS tablet Take 1 tablet (550 mg total) by mouth 2 (two) times daily. 180 tablet 3   sertraline (ZOLOFT) 100 MG tablet Take 200 mg daily with breakfast by mouth.      Current Facility-Administered Medications on File Prior to Visit  Medication Dose Route Frequency Provider Last Rate Last Admin   gadopentetate dimeglumine (MAGNEVIST) injection 20 mL  20 mL Intravenous Once PRN Marcial Pacas, MD        Review of Systems: General Not Present- Appetite Loss, Chills, Fatigue, Fever, Night Sweats, Weight Gain and Weight Loss. Skin Present- Non-Healing Wounds. Not Present- Change in Wart/Mole, Dryness, Hives, Jaundice, New Lesions, Rash and Ulcer. HEENT Present- Hearing  Loss and Wears glasses/contact lenses. Not Present- Earache, Hoarseness, Nose Bleed, Oral Ulcers, Ringing in the Ears, Seasonal Allergies, Sinus Pain, Sore Throat, Visual Disturbances and Yellow Eyes. Respiratory  Present- Snoring. Not Present- Bloody sputum, Chronic Cough, Difficulty Breathing and Wheezing. Breast Not Present- Breast Mass, Breast Pain, Nipple Discharge and Skin Changes. Cardiovascular Not Present- Chest Pain, Difficulty Breathing Lying Down, Leg Cramps, Palpitations, Rapid Heart Rate, Shortness of Breath and Swelling of Extremities. Gastrointestinal Present- Abdominal Pain, Bloody Stool, Chronic diarrhea and Hemorrhoids. Not Present- Bloating, Change in Bowel Habits, Constipation, Difficulty Swallowing, Excessive gas, Gets full quickly at meals, Indigestion, Nausea, Rectal Pain and Vomiting. Male Genitourinary Present- Blood in Urine, Frequency and Impotence. Not Present- Change in Urinary Stream, Nocturia, Painful Urination, Urgency and Urine Leakage. Musculoskeletal Present- Back Pain. Not Present- Joint Pain, Joint Stiffness, Muscle Pain, Muscle Weakness and Swelling of Extremities. Neurological Present- Decreased Memory, Numbness, Seizures and Trouble walking. Not Present- Fainting, Headaches, Tingling, Tremor and Weakness. Psychiatric Present- Anxiety and Depression. Not Present- Bipolar, Change in Sleep Pattern, Fearful and Frequent crying. Endocrine Present- Cold Intolerance. Not Present- Excessive Hunger, Hair Changes, Heat Intolerance, Hot flashes and New Diabetes. Hematology Present- Excessive bleeding. Not Present- Blood Thinners, Easy Bruising, Gland problems, HIV and Persistent Infections. All other systems negative  Physical Exam: Vitals  Weight: 186.8 lb Height: 71in Body Surface Area: 2.05 m Body Mass Index: 26.05 kg/m  Temp.: 98.90F  Pulse: 78 (Regular)  BP: 128/76(Sitting, Left Arm, Standard)  Alert and cooperative Unlabored respirations Abdomen is soft and nontender, fair amount of loose skin. There is a tender but reducible right inguinal hernia. No palpable hernia on the left.   CBC Latest Ref Rng & Units 07/12/2019 12/28/2018 07/09/2018  WBC 3.4 - 10.8  x10E3/uL 3.9 3.6 3.7  Hemoglobin 13.0 - 17.7 g/dL 12.3(L) 11.4(L) 12.3(L)  Hematocrit 37.5 - 51.0 % 34.6(L) 30.7(L) 34.1(L)  Platelets 150 - 450 x10E3/uL 90(LL) 87(LL) 92(LL)    CMP Latest Ref Rng & Units 07/12/2019 01/08/2019 12/28/2018  Glucose 65 - 99 mg/dL 85 95 79  BUN 6 - 24 mg/dL 13 13 17   Creatinine 0.76 - 1.27 mg/dL 0.93 0.88 1.13  Sodium 134 - 144 mmol/L 145(H) 143 144  Potassium 3.5 - 5.2 mmol/L 3.9 4.1 4.0  Chloride 96 - 106 mmol/L 109(H) 109(H) 109(H)  CO2 20 - 29 mmol/L 22 21 21   Calcium 8.7 - 10.2 mg/dL 9.3 9.4 9.0  Total Protein 6.0 - 8.5 g/dL 6.5 6.7 6.6  Total Bilirubin 0.0 - 1.2 mg/dL 2.5(H) 1.8(H) 3.1(H)  Alkaline Phos 39 - 117 IU/L 131(H) 162(H) 107  AST 0 - 40 IU/L 32 29 32  ALT 0 - 44 IU/L 23 16 18     Lab Results  Component Value Date   INR 1.2 07/12/2019   INR 1.2 (H) 07/15/2018   INR 1.30 02/02/2018    Imaging: No results found.   A/P: RIGHT INGUINAL HERNIA (K40.90) Story: We discussed the relevant anatomy and pathology as well as options for repair including robotic/laparoscopic or open. We discussed the relevant anatomy and we discussed the technique of the procedure. Discussed risks of bleeding, infection, pain, scarring, injury to structures in the area including nerves, blood vessels, bowel, bladder, risk of chronic pain, hernia recurrence, risk of seroma or hematoma, urinary retention, and risks of general anesthesia including cardiovascular, pulmonary, and thromboembolic complications. Questions were answered. His risk of perioperative morbidity is increased given his medical problems. The hernia is sizable, and the open approach would allow  a better inguinal floor repair although of course with the bigger incision, bleeding and infectious complication risk is slightly higher than laparoscopic. After going over the options and answering his questions, the patient wishes to proceed with an open repair. We will schedule at his convenience.    Patient  Active Problem List   Diagnosis Date Noted   At high risk for falls    S/P shoulder surgery 03/05/2018   Excessive daytime sleepiness 02/18/2018   Crohn's disease of colon with rectal bleeding ([place]) 02/18/2018   Blood in stool 02/18/2018   Iron deficiency anemia due to chronic blood loss 02/18/2018   Iron deficiency anemia due to sideropenic dysphagia 02/18/2018   Renal calculus, left 02/02/2018   Fatigue 12/16/2017   History of sepsis 12/16/2017   Therapeutic drug monitoring 10/07/2017   Renal cyst, acquired, left 07/08/2017   Frequent falls 07/08/2017   Staphylococcus aureus bacteremia with sepsis ([place]) 05/28/2017   Renal lesion 05/28/2017   Bacteremia due to Staphylococcus aureus 05/06/2017   MSSA bacteremia 05/06/2017   Confusion 04/02/2017   Syncope 08/30/2016   Depression 04/30/2016   Memory loss 04/30/2016   OSA on CPAP 10/31/2015   Nonepileptic episode ([place]) 04/25/2015   Seizure ([place]) 04/04/2013   Abnormal EKG 03/12/2013   DOE (dyspnea on exertion) 03/12/2013   Complex partial seizure ([place]) 04/19/2012   DM2 (diabetes mellitus, type 2) ([place]) 09/25/2011   Altered mental status 09/25/2011   OTHER DYSPHAGIA 03/27/2009   TRANSAMINASES, SERUM, ELEVATED 03/27/2009   Cough 11/10/2008   GERD 12/07/2007   COLONIC POLYPS, ADENOMATOUS, HX OF 12/07/2007   EXTERNAL HEMORRHOIDS 09/11/2007   CROHN'S DISEASE, LARGE AND SMALL INTESTINES 09/11/2007   OSTEOPOROSIS 09/11/2007   HYPERLIPIDEMIA NEC/NOS 02/27/2007   Essential hypertension 02/27/2007       Romana Juniper, MD [facility]  See AMION to contact appropriate on-call provider

## 2019-12-31 NOTE — H&P (Signed)
Surgical Evaluation   HPI:  This is a very pleasant 59 year old gentleman who is here today with his wife for evaluation of an increasingly symptomatic right inguinal hernia. This has been noticeable to him for at least a year but in the last few months has become increasingly painful when it protrudes. It has been reducible to this point. Denies any associated GI symptoms or obstruction symptoms however he does have Crohn's disease and often has issues primarily related to that. Denies associated urinary symptoms, does have a history of kidney stones, staph bacteremia, well-controlled type 2 diabetes, cirrhosis (Meld 12, child's A, last platelet count 90), complex seizure disorder and memory loss which has unfortunately caused him To go on disability. He previously worked in Engineer, technical sales at [employer]. Previous abdominal surgery includes umbilical hernia repair Dr. Zella Richer in January 6256, this was complicated by hematoma.  Allergies  Allergen Reactions  . Azithromycin Swelling    SWELLING REACTION UNSPECIFIED   . Beef-Derived Products Diarrhea    RED MEAT > UNSPECIFIED REACTION   . Milk-Related Compounds Diarrhea    DAIRY >UNSPECIFIED REACTION   . Penicillins Hives    Rash as child- tolerates Ancef/CHILDHOOD ALLERGY Has patient had a PCN reaction causing immediate rash, facial/tongue/throat swelling, SOB or lightheadedness with hypotension: YES Has patient had a PCN reaction causing severe rash involving mucus membranes or skin necrosis: Unknown Has patient had a PCN reaction that required hospitalization: Unknown Has patient had a PCN reaction occurring within the last 10 years:Unknown If all of the above answers are "NO", then may proceed with Cephalosporin [place]  . Sulfonamide Derivatives Hives  . Dilaudid [Hydromorphone Hcl] Nausea And Vomiting    Past Medical History:  Diagnosis Date  . Adenomatous colon polyp 10/1999  . Anxiety   . Arthritis    neck, yoga helps.  . At high risk for falls     due to Seizure disorder  . Cancer ([place])    skin  . Cataract   . Complex partial seizure ([place])    last seizure was 08-30-2018  . Crohn's disease of small and large intestines ([place]) 1999  . Depression   . Diabetes mellitus    no meds at this time 10-24-17  . Esophageal stricture   . External hemorrhoids   . GERD (gastroesophageal reflux disease)   . Glaucoma   . History of kidney stones    3 large stones still present  . Hyperlipemia   . Hypertension    resolved with weight loss   . Migraines   . MRSA (methicillin resistant Staphylococcus aureus) 10/2010   . Osteoporosis   . PONV (postoperative nausea and vomiting)   . PTSD (post-traumatic stress disorder)   . Renal cyst, acquired, left 07/08/2017  . Renal lesion 05/28/2017  . Seizures ([place])    last seizure was yesterday per pt in [place]- has seizures at [place] 6 days a week   . Skin cancer    squamous cell multiple; [facility]; followed every 3 months.  . Sleep apnea    CPAP machine, uses nightly  . Staphylococcus aureus bacteremia with sepsis ([place]) 05/28/2017    Past Surgical History:  Procedure Laterality Date  . CATARACT EXTRACTION Bilateral   . COLONOSCOPY    . ELBOW SURGERY  2012   elbow MRSA infection   . EYE SURGERY     cataracts removed, /w IOL  . INSERTION OF MESH N/A 07/01/2016   Procedure: INSERTION OF MESH;  Surgeon: [name], MD;  Location: [facility];  Service: General;  Laterality: N/A;  . IR URETERAL STENT LEFT NEW ACCESS W/O SEP NEPHROSTOMY CATH  02/02/2018  . NEPHROLITHOTOMY Left 02/02/2018   Procedure: NEPHROLITHOTOMY PERCUTANEOUS;  Surgeon: [name], MD;  Location: [facility];  Service: Urology;  Laterality: Left;  . POLYPECTOMY    . SCALP LACERATION REPAIR Right 10/16/2017   From fall/staples  . SHOULDER ARTHROSCOPY Right 03/05/2018   Procedure: ARTHROSCOPY SHOULDER AND OPEN DISTAL CLAVICLE EXCISION;  Surgeon: [name], MD;  Location: [facility];  Service: Orthopedics;  Laterality: Right;  . SHOULDER  SURGERY    . SINUS SURGERY WITH INSTATRAK    . TEE WITHOUT CARDIOVERSION N/A 05/09/2017   Procedure: TRANSESOPHAGEAL ECHOCARDIOGRAM (TEE);  Surgeon: [name], MD;  Location: [facility];  Service: Cardiovascular;  Laterality: N/A;  . TRANSTHORACIC ECHOCARDIOGRAM  02/22/2011   EF 55-65%; increased pattern of LVH with mild conc hypertrophy, abnormal relaxation & increased filling pressure (grade 2 diastolic dysfunction); atrial septum thickened (lipomatous hypertrophy)  . UMBILICAL HERNIA REPAIR N/A 07/01/2016   Procedure: UMBILICAL HERNIA REPAIR WITH MESH;  Surgeon: [name], MD;  Location: [facility];  Service: General;  Laterality: N/A;  . UPPER GASTROINTESTINAL ENDOSCOPY    . VASECTOMY      Family History  Problem Relation Age of Onset  . Colon polyps Father   . Stroke Father   . Hyperlipidemia Father   . Hypertension Father   . Heart disease Mother        CABG at age 61  . Hyperlipidemia Mother   . Hypertension Mother   . Stroke Paternal Grandmother   . Stroke Paternal Grandfather   . Other Child        tetrology of fallot (cornealia deland syndrome)  . Colon cancer Neg Hx   . Esophageal cancer Neg Hx   . Stomach cancer Neg Hx   . Rectal cancer Neg Hx     Social History   Socioeconomic History  . Marital status: Married    Spouse name: [name]  . Number of children: 2  . Years of education: college  . Highest education level: Not on file  Occupational History  . Occupation: disabled    Comment: seizure disorder with memory impairment  Tobacco Use  . Smoking status: Never Smoker  . Smokeless tobacco: Never Used  Vaping Use  . Vaping Use: Never used  Substance and Sexual Activity  . Alcohol use: No  . Drug use: No  . Sexual activity: Yes    Birth control/protection: Surgical    Comment: 1 partner in last 12 months  Other Topics Concern  . Not on file  Social History Narrative   Marital status: married x 32 years;       Lives:   lives at home with his  family.      Children:  two children; no grandchildren.  Son is special needs at age 63yo; Cornelian Delaine with TOF.       Employment: unemployed; [employer] long term disability      Tobacco: none      Alcohol: none      Drugs: none      Exercise: unable to exercise due to frequent falls   Patient has a college education.   Patient is right-handed.   Patient drinks one cup of soda daily.   Social Determinants of Health   Financial Resource Strain:   . Difficulty of Paying Living Expenses:   Food Insecurity:   . Worried About Estate manager/land agent  of Food in the Last Year:   . [place] in the Last Year:   Transportation Needs:   . Lack of Transportation (Medical):   Marland Kitchen Lack of Transportation (Non-Medical):   Physical Activity:   . Days of Exercise per Week:   . Minutes of Exercise per Session:   Stress:   . Feeling of Stress :   Social Connections:   . Frequency of Communication with Friends and Family:   . Frequency of Social Gatherings with Friends and Family:   . Attends Religious Services:   . Active Member of Clubs or Organizations:   . Attends Archivist Meetings:   Marland Kitchen Marital Status:     Current Outpatient Medications on File Prior to Visit  Medication Sig Dispense Refill  . Calcium Carbonate-Vitamin D (CALCIUM 600+D PO) Take 1 tablet by mouth 2 (two) times daily.    . cholecalciferol (VITAMIN D) 1000 units tablet Take 1,000 Units at bedtime by mouth.     . ciprofloxacin (CIPRO) 500 MG tablet Take 1 tablet (500 mg total) by mouth every 12 (twelve) hours. 14 tablet 0  . eletriptan (RELPAX) 20 MG tablet Take 1 tablet (20 mg total) by mouth as needed for migraine or headache. May repeat in 2 hours if headache persists or recurs. 10 tablet 5  . esomeprazole (NEXIUM) 40 MG capsule TAKE 1 CAPSULE TWICE DAILY 180 capsule 3  . eszopiclone (LUNESTA) 2 MG TABS tablet Take 2 mg by mouth at bedtime as needed for sleep. Take immediately before bedtime    . folic acid  (FOLVITE) 1 MG tablet Take 2 tablets (2 mg total) by mouth daily. 180 tablet 3  . glucose blood (CHOICE DM FORA G20 TEST STRIPS) test strip Use as instructed 100 each 12  . Ibuprofen-diphenhydrAMINE Cit (ADVIL PM PO) Take by mouth.    . lamoTRIgine (LAMICTAL) 150 MG tablet Take 1 tablet (150 mg total) by mouth 2 (two) times daily. 180 tablet 3  . LATANOPROST OP Apply to eye.    . mercaptopurine (PURINETHOL) 50 MG tablet Take 2 tablets (100 mg total) by mouth daily. Give on an empty stomach 1 hour before or 2 hours after meals. Caution: Chemotherapy. 60 tablet 5  . mesalamine (LIALDA) 1.2 g EC tablet TAKE 2 TABLETS (2.4GM)     DAILY WITH BREAKFAST. 180 tablet 3  . methscopolamine (PAMINE FORTE) 5 MG tablet TAKE 1 TABLET TWICE A DAY. 180 tablet 3  . ONE TOUCH ULTRA TEST test strip USE AS INSTRUCTED 100 each 2  . potassium chloride SA (K-DUR) 20 MEQ tablet Take 1 tablet (20 mEq total) by mouth 2 (two) times daily. 180 tablet 3  . Probiotic Product (PROBIOTIC PO) Take 1 tablet at bedtime by mouth.     . rifaximin (XIFAXAN) 550 MG TABS tablet Take 1 tablet (550 mg total) by mouth 2 (two) times daily. 180 tablet 3  . sertraline (ZOLOFT) 100 MG tablet Take 200 mg daily with breakfast by mouth.      Current Facility-Administered Medications on File Prior to Visit  Medication Dose Route Frequency Provider Last Rate Last Admin  . gadopentetate dimeglumine (MAGNEVIST) injection 20 mL  20 mL Intravenous Once PRN Marcial Pacas, MD        Review of Systems: General Not Present- Appetite Loss, Chills, Fatigue, Fever, Night Sweats, Weight Gain and Weight Loss. Skin Present- Non-Healing Wounds. Not Present- Change in Wart/Mole, Dryness, Hives, Jaundice, New Lesions, Rash and Ulcer. HEENT Present- Hearing  Loss and Wears glasses/contact lenses. Not Present- Earache, Hoarseness, Nose Bleed, Oral Ulcers, Ringing in the Ears, Seasonal Allergies, Sinus Pain, Sore Throat, Visual Disturbances and Yellow Eyes. Respiratory  Present- Snoring. Not Present- Bloody sputum, Chronic Cough, Difficulty Breathing and Wheezing. Breast Not Present- Breast Mass, Breast Pain, Nipple Discharge and Skin Changes. Cardiovascular Not Present- Chest Pain, Difficulty Breathing Lying Down, Leg Cramps, Palpitations, Rapid Heart Rate, Shortness of Breath and Swelling of Extremities. Gastrointestinal Present- Abdominal Pain, Bloody Stool, Chronic diarrhea and Hemorrhoids. Not Present- Bloating, Change in Bowel Habits, Constipation, Difficulty Swallowing, Excessive gas, Gets full quickly at meals, Indigestion, Nausea, Rectal Pain and Vomiting. Male Genitourinary Present- Blood in Urine, Frequency and Impotence. Not Present- Change in Urinary Stream, Nocturia, Painful Urination, Urgency and Urine Leakage. Musculoskeletal Present- Back Pain. Not Present- Joint Pain, Joint Stiffness, Muscle Pain, Muscle Weakness and Swelling of Extremities. Neurological Present- Decreased Memory, Numbness, Seizures and Trouble walking. Not Present- Fainting, Headaches, Tingling, Tremor and Weakness. Psychiatric Present- Anxiety and Depression. Not Present- Bipolar, Change in Sleep Pattern, Fearful and Frequent crying. Endocrine Present- Cold Intolerance. Not Present- Excessive Hunger, Hair Changes, Heat Intolerance, Hot flashes and New Diabetes. Hematology Present- Excessive bleeding. Not Present- Blood Thinners, Easy Bruising, Gland problems, HIV and Persistent Infections. All other systems negative  Physical Exam: Vitals  Weight: 186.8 lb Height: 71in Body Surface Area: 2.05 m Body Mass Index: 26.05 kg/m  Temp.: 98.77F  Pulse: 78 (Regular)  BP: 128/76(Sitting, Left Arm, Standard)  Alert and cooperative Unlabored respirations Abdomen is soft and nontender, fair amount of loose skin. There is a tender but reducible right inguinal hernia. No palpable hernia on the left.   CBC Latest Ref Rng & Units 07/12/2019 12/28/2018 07/09/2018  WBC 3.4 - 10.8  x10E3/uL 3.9 3.6 3.7  Hemoglobin 13.0 - 17.7 g/dL 12.3(L) 11.4(L) 12.3(L)  Hematocrit 37.5 - 51.0 % 34.6(L) 30.7(L) 34.1(L)  Platelets 150 - 450 x10E3/uL 90(LL) 87(LL) 92(LL)    CMP Latest Ref Rng & Units 07/12/2019 01/08/2019 12/28/2018  Glucose 65 - 99 mg/dL 85 95 79  BUN 6 - 24 mg/dL 13 13 17   Creatinine 0.76 - 1.27 mg/dL 0.93 0.88 1.13  Sodium 134 - 144 mmol/L 145(H) 143 144  Potassium 3.5 - 5.2 mmol/L 3.9 4.1 4.0  Chloride 96 - 106 mmol/L 109(H) 109(H) 109(H)  CO2 20 - 29 mmol/L 22 21 21   Calcium 8.7 - 10.2 mg/dL 9.3 9.4 9.0  Total Protein 6.0 - 8.5 g/dL 6.5 6.7 6.6  Total Bilirubin 0.0 - 1.2 mg/dL 2.5(H) 1.8(H) 3.1(H)  Alkaline Phos 39 - 117 IU/L 131(H) 162(H) 107  AST 0 - 40 IU/L 32 29 32  ALT 0 - 44 IU/L 23 16 18     Lab Results  Component Value Date   INR 1.2 07/12/2019   INR 1.2 (H) 07/15/2018   INR 1.30 02/02/2018    Imaging: No results found.   A/P: RIGHT INGUINAL HERNIA (K40.90) Story: We discussed the relevant anatomy and pathology as well as options for repair including robotic/laparoscopic or open. We discussed the relevant anatomy and we discussed the technique of the procedure. Discussed risks of bleeding, infection, pain, scarring, injury to structures in the area including nerves, blood vessels, bowel, bladder, risk of chronic pain, hernia recurrence, risk of seroma or hematoma, urinary retention, and risks of general anesthesia including cardiovascular, pulmonary, and thromboembolic complications. Questions were answered. His risk of perioperative morbidity is increased given his medical problems. The hernia is sizable, and the open approach would allow  a better inguinal floor repair although of course with the bigger incision, bleeding and infectious complication risk is slightly higher than laparoscopic. After going over the options and answering his questions, the patient wishes to proceed with an open repair. We will schedule at his convenience.    Patient  Active Problem List   Diagnosis Date Noted  . At high risk for falls   . S/P shoulder surgery 03/05/2018  . Excessive daytime sleepiness 02/18/2018  . Crohn's disease of colon with rectal bleeding ([place]) 02/18/2018  . Blood in stool 02/18/2018  . Iron deficiency anemia due to chronic blood loss 02/18/2018  . Iron deficiency anemia due to sideropenic dysphagia 02/18/2018  . Renal calculus, left 02/02/2018  . Fatigue 12/16/2017  . History of sepsis 12/16/2017  . Therapeutic drug monitoring 10/07/2017  . Renal cyst, acquired, left 07/08/2017  . Frequent falls 07/08/2017  . Staphylococcus aureus bacteremia with sepsis ([place]) 05/28/2017  . Renal lesion 05/28/2017  . Bacteremia due to Staphylococcus aureus 05/06/2017  . MSSA bacteremia 05/06/2017  . Confusion 04/02/2017  . Syncope 08/30/2016  . Depression 04/30/2016  . Memory loss 04/30/2016  . OSA on CPAP 10/31/2015  . Nonepileptic episode ([place]) 04/25/2015  . Seizure ([place]) 04/04/2013  . Abnormal EKG 03/12/2013  . DOE (dyspnea on exertion) 03/12/2013  . Complex partial seizure ([place]) 04/19/2012  . DM2 (diabetes mellitus, type 2) ([place]) 09/25/2011  . Altered mental status 09/25/2011  . OTHER DYSPHAGIA 03/27/2009  . TRANSAMINASES, SERUM, ELEVATED 03/27/2009  . Cough 11/10/2008  . GERD 12/07/2007  . COLONIC POLYPS, ADENOMATOUS, HX OF 12/07/2007  . EXTERNAL HEMORRHOIDS 09/11/2007  . CROHN'S DISEASE, LARGE AND SMALL INTESTINES 09/11/2007  . OSTEOPOROSIS 09/11/2007  . HYPERLIPIDEMIA NEC/NOS 02/27/2007  . Essential hypertension 02/27/2007       Romana Juniper, MD [facility]  See AMION to contact appropriate on-call provider

## 2020-01-07 NOTE — Patient Instructions (Addendum)
DUE TO COVID-19 ONLY ONE VISITOR ARE ALLOWED TO COME WITH YOU AND STAY IN THE WAITING ROOM ONLY DURING PRE OP AND  PROCEDURE. THEN TWO VISITORS MAY VISIT WITH YOU IN YOUR PRIVATE ROOM DURING VISITING HOURS ONLY!! (10AM-8PM)   COVID SWAB TESTING MUST BE COMPLETED ON:    01-11-20 @8 :00 AM (Must self quarantine after testing. Follow instructions on handout.)             Your procedure is scheduled on: 01-14-20   Report to [facility] Main  Entrance    Report to admitting at 1:00 PM   Call this number if you have problems the morning of surgery [phone]   After Midnight, you may have a Clear Liquid Diet until 9:00 am day of surgery  CLEAR LIQUID DIET  Foods Allowed                                                                     Foods Excluded  Water, Black Coffee and tea, regular and decaf             liquids that you cannot  Plain Jell-O in any flavor  (No red)                                    see through such as: Fruit ices (not with fruit pulp)                                      milk, soups, orange juice  Iced Popsicles (No red)                                     All solid food                                   Apple juices Sports drinks like Gatorade (No red) Lightly seasoned clear broth or consume(fat free) Sugar, honey syrup      Take these medicines the morning of surgery with A SIP OF WATER: Eletriptan (Relpax), Esomeprazole (Nexium), Lamotrigine (Lamictal),  Mercaptopurine (Purinethol), Rifaximin (Xifaxam), and Sertraline (Zoloft),    Oral Hygiene is also important to reduce your risk of infection.                                     Remember - BRUSH YOUR TEETH THE MORNING OF SURGERY WITH YOUR REGULAR TOOTHPASTE.  AFTERWARDS NO WATER, GUM,  CANDY OR MINTS                              You may not have any metal on your body including jewelry, and body piercings              Do not wear lotions, powders, cologne, or deodorant  Men may shave  face and neck.   Do not bring valuables to the hospital. [place].   Contacts, dentures or bridgework may not be worn into surgery.   Patients discharged the day of surgery will not be allowed to drive home.   Special Instructions: Please bring your mask and tubing for your CPAP.              Please read over the following fact sheets you were given: IF YOU HAVE QUESTIONS ABOUT YOUR PRE OP INSTRUCTIONS PLEASE CALL  [phone]     [facility] - Preparing for Surgery Before surgery, you can play an important role.  Because skin is not sterile, your skin needs to be as free of germs as possible.  You can reduce the number of germs on your skin by washing with CHG (chlorahexidine gluconate) soap before surgery.  CHG is an antiseptic cleaner which kills germs and bonds with the skin to continue killing germs even after washing. Please DO NOT use if you have an allergy to CHG or antibacterial soaps.  If your skin becomes reddened/irritated stop using the CHG and inform your nurse when you arrive at Short Stay. Do not shave (including legs and underarms) for at least 48 hours prior to the first CHG shower.  You may shave your face/neck.  Please follow these instructions carefully:  1.  Shower with CHG Soap the night before surgery and the  morning of surgery.  2.  If you choose to wash your hair, wash your hair first as usual with your normal  shampoo.  3.  After you shampoo, rinse your hair and body thoroughly to remove the shampoo.                             4.  Use CHG as you would any other liquid soap.  You can apply chg directly to the skin and wash.  Gently with a scrungie or clean washcloth.  5.  Apply the CHG Soap to your body ONLY FROM THE NECK DOWN.   Do   not use on face/ open                           Wound or open sores. Avoid contact with eyes, ears mouth and   genitals (private parts).                       Wash face,  Genitals (private  parts) with your normal soap.             6.  Wash thoroughly, paying special attention to the area where your    surgery  will be performed.  7.  Thoroughly rinse your body with warm water from the neck down.  8.  DO NOT shower/wash with your normal soap after using and rinsing off the CHG Soap.                9.  Pat yourself dry with a clean towel.            10.  Wear clean pajamas.            11.  Place clean sheets on your bed the night of your first shower and do not  sleep with pets. Day of Surgery : Do not apply any lotions/deodorants the morning  of surgery.  Please wear clean clothes to the hospital/surgery center.  FAILURE TO FOLLOW THESE INSTRUCTIONS MAY RESULT IN THE CANCELLATION OF YOUR SURGERY  PATIENT SIGNATURE_________________________________  NURSE SIGNATURE__________________________________  ________________________________________________________________________

## 2020-01-07 NOTE — Progress Notes (Addendum)
COVID Vaccine Completed: Date COVID Vaccine completed: COVID vaccine manufacturer: [place]   PCP - Grant Fontana, MD Cardiologist -   Chest x-ray -  EKG - 01/11/20 Stress Test - 05-07-17 ECHO -  Cardiac Cath -   Sleep Study -  CPAP -   Fasting Blood Sugar - 80's  Checks Blood Sugar  Weekly times a day- Diet controlled after 90lbs weight loss  Blood Thinner Instructions: Aspirin Instructions: Last Dose:   ADL's w/o SOB Anesthesia review:   Patient denies shortness of breath, fever, cough and chest pain at PAT appointment   Patient verbalized understanding of instructions that were given to them at the PAT appointment. Patient was also instructed that they will need to review over the PAT instructions again at home before surgery.

## 2020-01-11 ENCOUNTER — Telehealth: Payer: Self-pay

## 2020-01-11 ENCOUNTER — Ambulatory Visit (INDEPENDENT_AMBULATORY_CARE_PROVIDER_SITE_OTHER): Payer: 59 | Admitting: Family Medicine

## 2020-01-11 ENCOUNTER — Other Ambulatory Visit: Payer: Self-pay

## 2020-01-11 ENCOUNTER — Encounter (HOSPITAL_COMMUNITY): Payer: Self-pay

## 2020-01-11 ENCOUNTER — Telehealth: Payer: Self-pay | Admitting: Family Medicine

## 2020-01-11 ENCOUNTER — Other Ambulatory Visit (HOSPITAL_COMMUNITY)
Admission: RE | Admit: 2020-01-11 | Discharge: 2020-01-11 | Disposition: A | Discharge status: Home / Self Care | Payer: 59 | Source: Ambulatory Visit | Attending: Surgery | Admitting: Surgery

## 2020-01-11 ENCOUNTER — Encounter: Payer: Self-pay | Admitting: Family Medicine

## 2020-01-11 ENCOUNTER — Encounter (HOSPITAL_COMMUNITY)
Admission: RE | Admit: 2020-01-11 | Discharge: 2020-01-11 | Disposition: A | Discharge status: Home / Self Care | Payer: 59 | Source: Ambulatory Visit | Attending: Surgery | Admitting: Surgery

## 2020-01-11 VITALS — BP 126/75 | HR 83 | Temp 98.6°F | Ht 71.0 in | Wt 188.0 lb

## 2020-01-11 DIAGNOSIS — I10 Essential (primary) hypertension: Secondary | ICD-10-CM

## 2020-01-11 DIAGNOSIS — R9431 Abnormal electrocardiogram [ECG] [EKG]: Secondary | ICD-10-CM | POA: Insufficient documentation

## 2020-01-11 DIAGNOSIS — E876 Hypokalemia: Secondary | ICD-10-CM | POA: Diagnosis not present

## 2020-01-11 DIAGNOSIS — Z125 Encounter for screening for malignant neoplasm of prostate: Secondary | ICD-10-CM

## 2020-01-11 DIAGNOSIS — Z20822 Contact with and (suspected) exposure to covid-19: Secondary | ICD-10-CM | POA: Insufficient documentation

## 2020-01-11 DIAGNOSIS — Z01818 Encounter for other preprocedural examination: Secondary | ICD-10-CM | POA: Diagnosis not present

## 2020-01-11 DIAGNOSIS — R319 Hematuria, unspecified: Secondary | ICD-10-CM

## 2020-01-11 DIAGNOSIS — E119 Type 2 diabetes mellitus without complications: Secondary | ICD-10-CM | POA: Diagnosis not present

## 2020-01-11 DIAGNOSIS — R6889 Other general symptoms and signs: Secondary | ICD-10-CM

## 2020-01-11 DIAGNOSIS — D539 Nutritional anemia, unspecified: Secondary | ICD-10-CM

## 2020-01-11 DIAGNOSIS — K7469 Other cirrhosis of liver: Secondary | ICD-10-CM

## 2020-01-11 DIAGNOSIS — R7989 Other specified abnormal findings of blood chemistry: Secondary | ICD-10-CM

## 2020-01-11 LAB — POCT GLYCOSYLATED HEMOGLOBIN (HGB A1C): Hemoglobin A1C: 4.5 % (ref 4.0–5.6)

## 2020-01-11 LAB — GLUCOSE, CAPILLARY: Glucose-Capillary: 83 mg/dL (ref 70–99)

## 2020-01-11 LAB — SURGICAL PCR SCREEN
MRSA, PCR: NEGATIVE
Staphylococcus aureus: POSITIVE — AB

## 2020-01-11 LAB — SARS CORONAVIRUS 2 (TAT 6-24 HRS): SARS Coronavirus 2: NEGATIVE

## 2020-01-11 LAB — CBC WITH DIFFERENTIAL/PLATELET
Abs Immature Granulocytes: 0.02 10*3/uL (ref 0.00–0.07)
Basophils Absolute: 0 10*3/uL (ref 0.0–0.1)
Basophils Relative: 1 %
Eosinophils Absolute: 0.2 10*3/uL (ref 0.0–0.5)
Eosinophils Relative: 7 %
HCT: 30.8 % — ABNORMAL LOW (ref 39.0–52.0)
Hemoglobin: 10.6 g/dL — ABNORMAL LOW (ref 13.0–17.0)
Immature Granulocytes: 1 %
Lymphocytes Relative: 17 %
Lymphs Abs: 0.5 10*3/uL — ABNORMAL LOW (ref 0.7–4.0)
MCH: 38.8 pg — ABNORMAL HIGH (ref 26.0–34.0)
MCHC: 34.4 g/dL (ref 30.0–36.0)
MCV: 112.8 fL — ABNORMAL HIGH (ref 80.0–100.0)
Monocytes Absolute: 0.2 10*3/uL (ref 0.1–1.0)
Monocytes Relative: 9 %
Neutro Abs: 1.9 10*3/uL (ref 1.7–7.7)
Neutrophils Relative %: 65 %
Platelets: 81 10*3/uL — ABNORMAL LOW (ref 150–400)
RBC: 2.73 MIL/uL — ABNORMAL LOW (ref 4.22–5.81)
RDW: 15.5 % (ref 11.5–15.5)
WBC: 2.8 10*3/uL — ABNORMAL LOW (ref 4.0–10.5)
nRBC: 0 % (ref 0.0–0.2)

## 2020-01-11 MED ORDER — POTASSIUM CHLORIDE CRYS ER 20 MEQ PO TBCR: 20.0000 meq | EXTENDED_RELEASE_TABLET | Freq: Two times a day (BID) | ORAL | 3 refills | Status: DC

## 2020-01-11 MED ORDER — FOLIC ACID 1 MG PO TABS: 2.0000 mg | ORAL_TABLET | Freq: Every day | ORAL | 3 refills | Status: DC

## 2020-01-11 NOTE — Patient Instructions (Signed)
° ° ° °  If you have lab work done today you will be contacted with your lab results within the next 2 weeks.  If you have not heard from us then please contact us. The fastest way to get your results is to register for My Chart. ° ° °IF you received an x-ray today, you will receive an invoice from [facility]. Please contact [facility] at [phone] with questions or concerns regarding your invoice.  ° °IF you received labwork today, you will receive an invoice from [facility]. Please contact [facility] at [phone] with questions or concerns regarding your invoice.  ° °Our billing staff will not be able to assist you with questions regarding bills from these companies. ° °You will be contacted with the lab results as soon as they are available. The fastest way to get your results is to activate your My Chart account. Instructions are located on the last page of this paperwork. If you have not heard from us regarding the results in 2 weeks, please contact this office. °  ° ° ° °

## 2020-01-11 NOTE — Progress Notes (Signed)
7/20/20219:06 AM  [name] [name] 10/16/1960, 59 y.o., male [id number]  Chief Complaint  Patient presents with  . Diabetes    6 month follow up   . Hypertension  . R inguinal hernia surgery    01/14/2020 pre-op w/ [place]   . Hematuria    states bleeds easily all over body     HPI:   Patient is a 59 y.o. male with past medical history significant for DM2, HTN, chrons disease with cirrhosis, kidney stones, anxiety, depression, seizures, glaucoma, bilateral optical neuropathy, recurrent fallswho presents today for routine followup  Last OV jan 2021  Seen in er in June 2021 for gross hematuria 2/2 UTI however ur cx was negative Seeing [facility] vision rehab and performance, has had 2 sessions for optic neuropathy, bilateral Saw neuro at [place] in march 2021  Patient reports that things have been stable Continues to struggle with dizziness and falls Needs disability forms re done Patient's wife does not feel that neuro at [place] was very beneficial He reports that gross hematuria had completed resolved until recently, past 1-2 days has noticed a bit of blood, he was also having dysuria with urinary frequency last week, none currently  He has h/o kidney stones Reports last surgical intervention for kidney stones was aug 2019, denies any flank pain, denies any fever or chills He continues to walk his dog He checks cbgs sporadically, 70-80s, fasting He stopped taking iron and b12 supplement He is taking ca/vitamin D He is having surgery for right inguinal hernia next week Patient reports worsening cold intolerance over past several months with worsening short term memory   Depression screen [facility] 2/9 01/11/2020 09/15/2019 07/12/2019  Decreased Interest 0 2 0  Down, Depressed, Hopeless 0 - 0  PHQ - 2 Score 0 2 0  Altered sleeping - 1 -  Tired, decreased energy - 2 -  Change in appetite - 2 -  Feeling bad or failure about yourself  - 2 -  Trouble concentrating - 2 -  Moving  slowly or fidgety/restless - 2 -  Suicidal thoughts - 0 -  PHQ-9 Score - 13 -  Difficult doing work/chores - Very difficult -  Some recent data might be hidden    Fall Risk  01/11/2020 09/15/2019 07/12/2019 02/01/2019 01/08/2019  Falls in the past year? 0 1 0 0 1  Number falls in past yr: 0 1 0 0 1  Comment - patient has hx falls - - -  Injury with Fall? 0 1 0 0 1  Comment - - - - -  Risk Factor Category  - - - - -  Risk for fall due to : - History of fall(s) - - -  Follow up Falls evaluation completed Falls evaluation completed;Education provided - - -     Allergies  Allergen Reactions  . Azithromycin Swelling    SWELLING REACTION UNSPECIFIED   . Beef-Derived Products Diarrhea    RED MEAT > UNSPECIFIED REACTION   . Milk-Related Compounds Diarrhea    DAIRY >UNSPECIFIED REACTION   . Penicillins Hives    Rash as child- tolerates Ancef/CHILDHOOD ALLERGY Has patient had a PCN reaction causing immediate rash, facial/tongue/throat swelling, SOB or lightheadedness with hypotension: YES Has patient had a PCN reaction causing severe rash involving mucus membranes or skin necrosis: Unknown Has patient had a PCN reaction that required hospitalization: Unknown Has patient had a PCN reaction occurring within the last 10 years:Unknown If all of the above answers  are "NO", then may proceed with Cephalosporin [place]  . Sulfonamide Derivatives Hives  . Dilaudid [Hydromorphone Hcl] Nausea And Vomiting    Prior to Admission medications   Medication Sig Start Date End Date Taking? Authorizing Provider  Ascorbic Acid (VITAMIN C PO) Take 1 tablet by mouth daily.   Yes [provider]  Calcium Carbonate-Vitamin D (CALCIUM 600+D PO) Take 1 tablet by mouth 2 (two) times daily.   Yes [provider]  cholecalciferol (VITAMIN D) 1000 units tablet Take 1,000 Units at bedtime by mouth.    Yes [provider]  eletriptan (RELPAX) 20 MG tablet Take 1 tablet (20 mg total) by mouth as  needed for migraine or headache. May repeat in 2 hours if headache persists or recurs. 07/12/19  Yes Rutherford Guys, MD  esomeprazole (NEXIUM) 40 MG capsule TAKE 1 CAPSULE TWICE DAILY Patient taking differently: Take 40 mg by mouth 2 (two) times daily before a meal.  07/27/19  Yes Ladene Artist, MD  Eszopiclone 3 MG TABS Take 3 mg by mouth at bedtime. Take immediately before bedtime    Yes [provider]  folic acid (FOLVITE) 1 MG tablet Take 2 tablets (2 mg total) by mouth daily. 01/08/19  Yes Rutherford Guys, MD  glucose blood (CHOICE DM FORA G20 TEST STRIPS) test strip Use as instructed 11/13/12  Yes [name], MD  Ibuprofen-diphenhydrAMINE Cit (ADVIL PM PO) Take 1 tablet by mouth at bedtime.    Yes [provider]  Lactase (DAIRY RELIEF PO) Take 2 tablets by mouth in the morning and at bedtime.   Yes [provider]  lamoTRIgine (LAMICTAL) 150 MG tablet Take 1 tablet (150 mg total) by mouth 2 (two) times daily. 07/01/19  Yes Lomax, Amy, NP  latanoprost (XALATAN) 0.005 % ophthalmic solution Place 1 drop into both eyes at bedtime.    Yes [provider]  mercaptopurine (PURINETHOL) 50 MG tablet Take 2 tablets (100 mg total) by mouth daily. Give on an empty stomach 1 hour before or 2 hours after meals. Caution: Chemotherapy. 08/16/19  Yes Ladene Artist, MD  mesalamine (LIALDA) 1.2 g EC tablet TAKE 2 TABLETS (2.4GM)     DAILY WITH BREAKFAST. Patient taking differently: Take 1.2 g by mouth in the morning and at bedtime.  07/27/19  Yes Ladene Artist, MD  methscopolamine (PAMINE FORTE) 5 MG tablet TAKE 1 TABLET TWICE A DAY. Patient taking differently: Take 5 mg by mouth 2 (two) times daily.  07/27/19  Yes Ladene Artist, MD  ONE TOUCH ULTRA TEST test strip USE AS INSTRUCTED   Yes Weber, Sarah L, PA-C  potassium chloride SA (K-DUR) 20 MEQ tablet Take 1 tablet (20 mEq total) by mouth 2 (two) times daily. 01/08/19  Yes Rutherford Guys, MD  Probiotic Product  (PROBIOTIC PO) Take 1 tablet at bedtime by mouth.    Yes [provider]  rifaximin (XIFAXAN) 550 MG TABS tablet Take 1 tablet (550 mg total) by mouth 2 (two) times daily. 07/27/19  Yes Ladene Artist, MD  sertraline (ZOLOFT) 100 MG tablet Take 200 mg daily with breakfast by mouth.  03/16/17  Yes [provider]  traZODone (DESYREL) 150 MG tablet Take 150 mg by mouth at bedtime.   Yes [provider]    Past Medical History:  Diagnosis Date  . Adenomatous colon polyp 10/1999  . Anxiety   . Arthritis    neck, yoga helps.  . At high risk for  falls    due to Seizure disorder  . Cancer ([place])    skin  . Cataract   . Complex partial seizure ([place])    last seizure was 08-30-2018  . Crohn's disease of small and large intestines ([place]) 1999  . Depression   . Diabetes mellitus    no meds at this time 10-24-17  . Esophageal stricture   . External hemorrhoids   . GERD (gastroesophageal reflux disease)   . Glaucoma   . History of kidney stones    3 large stones still present  . Hyperlipemia   . Hypertension    resolved with weight loss   . Migraines   . MRSA (methicillin resistant Staphylococcus aureus) 10/2010   . Osteoporosis   . PONV (postoperative nausea and vomiting)   . PTSD (post-traumatic stress disorder)   . Renal cyst, acquired, left 07/08/2017  . Renal lesion 05/28/2017  . Seizures ([place])    last seizure was yesterday per pt in [place]- has seizures at [place] 6 days a week   . Skin cancer    squamous cell multiple; [place]; followed every 3 months.  . Sleep apnea    CPAP machine, uses nightly  . Staphylococcus aureus bacteremia with sepsis ([place]) 05/28/2017    Past Surgical History:  Procedure Laterality Date  . CATARACT EXTRACTION Bilateral   . COLONOSCOPY    . ELBOW SURGERY  2012   elbow MRSA infection   . EYE SURGERY     cataracts removed, /w IOL  . INSERTION OF MESH N/A 07/01/2016   Procedure: INSERTION OF MESH;  Surgeon: [name], MD;   Location: [place];  Service: General;  Laterality: N/A;  . IR URETERAL STENT LEFT NEW ACCESS W/O SEP NEPHROSTOMY CATH  02/02/2018  . NEPHROLITHOTOMY Left 02/02/2018   Procedure: NEPHROLITHOTOMY PERCUTANEOUS;  Surgeon: [name], MD;  Location: [facility];  Service: Urology;  Laterality: Left;  . POLYPECTOMY    . SCALP LACERATION REPAIR Right 10/16/2017   From fall/staples  . SHOULDER ARTHROSCOPY Right 03/05/2018   Procedure: ARTHROSCOPY SHOULDER AND OPEN DISTAL CLAVICLE EXCISION;  Surgeon: [name], MD;  Location: [facility];  Service: Orthopedics;  Laterality: Right;  . SHOULDER SURGERY    . SINUS SURGERY WITH INSTATRAK    . TEE WITHOUT CARDIOVERSION N/A 05/09/2017   Procedure: TRANSESOPHAGEAL ECHOCARDIOGRAM (TEE);  Surgeon: [name], MD;  Location: [facility]COPY;  Service: Cardiovascular;  Laterality: N/A;  . TRANSTHORACIC ECHOCARDIOGRAM  02/22/2011   EF 55-65%; increased pattern of LVH with mild conc hypertrophy, abnormal relaxation & increased filling pressure (grade 2 diastolic dysfunction); atrial septum thickened (lipomatous hypertrophy)  . UMBILICAL HERNIA REPAIR N/A 07/01/2016   Procedure: UMBILICAL HERNIA REPAIR WITH MESH;  Surgeon: [name], MD;  Location: [facility];  Service: General;  Laterality: N/A;  . UPPER GASTROINTESTINAL ENDOSCOPY    . VASECTOMY      Social History   Tobacco Use  . Smoking status: Never Smoker  . Smokeless tobacco: Never Used  Substance Use Topics  . Alcohol use: No    Family History  Problem Relation Age of Onset  . Colon polyps Father   . Stroke Father   . Hyperlipidemia Father   . Hypertension Father   . Heart disease Mother        CABG at age 55  . Hyperlipidemia Mother   . Hypertension Mother   . Stroke Paternal Grandmother   . Stroke Paternal Grandfather   . Other Child  tetrology of fallot (cornealia deland syndrome)  . Colon cancer Neg Hx   . Esophageal cancer Neg Hx   . Stomach cancer Neg Hx   . Rectal cancer Neg Hx      ROS Per hpi  OBJECTIVE:  Today's Vitals   01/11/20 0828  BP: 126/75  Pulse: 83  Temp: 98.6 F (37 C)  SpO2: 97%  Weight: 188 lb (85.3 kg)  Height: 5' 11"  (1.803 m)   Body mass index is 26.22 kg/m.  Wt Readings from Last 3 Encounters:  01/11/20 188 lb (85.3 kg)  12/02/19 183 lb (83 kg)  09/15/19 190 lb (86.2 kg)    Physical Exam Vitals and nursing note reviewed.  Constitutional:      Appearance: He is well-developed.  HENT:     Head: Normocephalic and atraumatic.  Eyes:     Conjunctiva/sclera: Conjunctivae normal.     Pupils: Pupils are equal, round, and reactive to light.  Cardiovascular:     Rate and Rhythm: Normal rate and regular rhythm.     Heart sounds: No murmur heard.  No friction rub. No gallop.   Pulmonary:     Effort: Pulmonary effort is normal.     Breath sounds: Normal breath sounds. No wheezing or rales.  Musculoskeletal:     Cervical back: Neck supple.  Skin:    General: Skin is warm and dry.  Neurological:     Mental Status: He is alert and oriented to person, place, and time.     Results for orders placed or performed in visit on 01/11/20 (from the past 24 hour(s))  POCT glycosylated hemoglobin (Hb A1C)     Status: None   Collection Time: 01/11/20  9:32 AM  Result Value Ref Range   Hemoglobin A1C 4.5 4.0 - 5.6 %   HbA1c POC (<> result, manual entry)     HbA1c, POC (prediabetic range)     HbA1c, POC (controlled diabetic range)      No results found.   ASSESSMENT and PLAN  1. Type 2 diabetes mellitus without complication, without long-term current use of insulin (HCC) Diet controlled. Discussed fasting goal 90-130, encouraged small evening meal - POCT glycosylated hemoglobin (Hb A1C) - Lipid panel - CMP14+EGFR - Microalbumin / creatinine urine ratio; Future  2. Essential hypertension Controlled. Continue current regime.  - Lipid panel - CMP14+EGFR  3. Screening for prostate cancer - PSA  4. Hypokalemia Lab pending. -  potassium chloride SA (KLOR-CON) 20 MEQ tablet; Take 1 tablet (20 mEq total) by mouth 2 (two) times daily.  5. Anemia, macrocytic - folic acid (FOLVITE) 1 MG tablet; Take 2 tablets (2 mg total) by mouth daily.  6. Hematuria, unspecified type - Urinalysis, Routine w reflex microscopic; Future - CT ABDOMEN PELVIS WO CONTRAST; Future - to eval for kidney stones  7. Elevated ferritin - Ferritin  8. Other cirrhosis of liver ([place]) - Protime-INR  9. Cold intolerance - TSH  Return in about 6 months (around 07/13/2020).    Rutherford Guys, MD Primary Care at [place], [place] [address] Ph.  [phone] Fax [phone]

## 2020-01-11 NOTE — Telephone Encounter (Signed)
Pt disability form faxed to [facility]. Copy at nurses station.

## 2020-01-11 NOTE — Telephone Encounter (Signed)
Called pt spoke with wife. She will call back and schedule 6 month follow up .

## 2020-01-11 NOTE — Telephone Encounter (Signed)
Patient not able to void during office visit. Given specimen cup to bring back. Orders for urine have been placed for future

## 2020-01-12 ENCOUNTER — Other Ambulatory Visit: Payer: Self-pay

## 2020-01-12 DIAGNOSIS — E119 Type 2 diabetes mellitus without complications: Secondary | ICD-10-CM

## 2020-01-12 DIAGNOSIS — R319 Hematuria, unspecified: Secondary | ICD-10-CM

## 2020-01-12 LAB — CMP14+EGFR
ALT: 18 IU/L (ref 0–44)
AST: 23 IU/L (ref 0–40)
Albumin/Globulin Ratio: 2.5 — ABNORMAL HIGH (ref 1.2–2.2)
Albumin: 4.2 g/dL (ref 3.8–4.9)
Alkaline Phosphatase: 139 IU/L — ABNORMAL HIGH (ref 48–121)
BUN/Creatinine Ratio: 11 (ref 9–20)
BUN: 10 mg/dL (ref 6–24)
Bilirubin Total: 2.8 mg/dL — ABNORMAL HIGH (ref 0.0–1.2)
CO2: 24 mmol/L (ref 20–29)
Calcium: 9.1 mg/dL (ref 8.7–10.2)
Chloride: 107 mmol/L — ABNORMAL HIGH (ref 96–106)
Creatinine, Ser: 0.95 mg/dL (ref 0.76–1.27)
GFR calc Af Amer: 102 mL/min/{1.73_m2} (ref >59)
GFR calc non Af Amer: 88 mL/min/{1.73_m2} (ref >59)
Globulin, Total: 1.7 g/dL (ref 1.5–4.5)
Glucose: 87 mg/dL (ref 65–99)
Potassium: 3.9 mmol/L (ref 3.5–5.2)
Sodium: 142 mmol/L (ref 134–144)
Total Protein: 5.9 g/dL — ABNORMAL LOW (ref 6.0–8.5)

## 2020-01-12 LAB — LIPID PANEL
Chol/HDL Ratio: 2 ratio (ref 0.0–5.0)
Cholesterol, Total: 151 mg/dL (ref 100–199)
HDL: 77 mg/dL (ref >39)
LDL Chol Calc (NIH): 62 mg/dL (ref 0–99)
Triglycerides: 61 mg/dL (ref 0–149)
VLDL Cholesterol Cal: 12 mg/dL (ref 5–40)

## 2020-01-12 LAB — FERRITIN: Ferritin: 615 ng/mL — ABNORMAL HIGH (ref 30–400)

## 2020-01-12 LAB — PROTIME-INR
INR: 1.1 (ref 0.9–1.2)
Prothrombin Time: 11.7 s (ref 9.1–12.0)

## 2020-01-12 LAB — PSA: Prostate Specific Ag, Serum: 0.2 ng/mL (ref 0.0–4.0)

## 2020-01-12 LAB — TSH: TSH: 1.68 u[IU]/mL (ref 0.450–4.500)

## 2020-01-12 NOTE — Addendum Note (Signed)
Addended by: Meredeth Ide on: 01/12/2020 09:06 AM   Modules accepted: Orders

## 2020-01-13 LAB — URINALYSIS, ROUTINE W REFLEX MICROSCOPIC
Bilirubin, UA: NEGATIVE
Glucose, UA: NEGATIVE
Ketones, UA: NEGATIVE
Leukocytes,UA: NEGATIVE
Nitrite, UA: NEGATIVE
Protein,UA: NEGATIVE
RBC, UA: NEGATIVE
Specific Gravity, UA: 1.015 (ref 1.005–1.030)
Urobilinogen, Ur: 1 mg/dL (ref 0.2–1.0)
pH, UA: 7 (ref 5.0–7.5)

## 2020-01-13 LAB — MICROALBUMIN / CREATININE URINE RATIO
Creatinine, Urine: 58.1 mg/dL
Microalb/Creat Ratio: 39 mg/g creat — ABNORMAL HIGH (ref 0–29)
Microalbumin, Urine: 22.6 ug/mL

## 2020-01-13 NOTE — Progress Notes (Signed)
Spoke to pt's wife to advise that pt's surgery is now at 12:00 PM, in lieu of 3:00 PM on 01-14-20. Pt to report to Admitting at 10:00 AM, and to remain NPO after midnight.   FYI.Marland KitchenMarland KitchenMarland KitchenPt was given previous instructions regarding a Clear Liquid Diet until 9:00 AM, as pt's surgery was originally scheduled at 3:00 PM. Now that pt's surgery is at 12:00, pt to remain NPO after midnight. Wife verbalized understanding.

## 2020-01-14 ENCOUNTER — Ambulatory Visit (HOSPITAL_COMMUNITY): Payer: 59 | Admitting: Physician Assistant

## 2020-01-14 ENCOUNTER — Encounter (HOSPITAL_COMMUNITY): Payer: Self-pay | Admitting: Surgery

## 2020-01-14 ENCOUNTER — Encounter (HOSPITAL_COMMUNITY)
Admission: RE | Disposition: A | Discharge status: Home / Self Care | Payer: Self-pay | Source: Home / Self Care | Attending: Surgery

## 2020-01-14 ENCOUNTER — Ambulatory Visit (HOSPITAL_COMMUNITY): Payer: 59 | Admitting: Certified Registered Nurse Anesthetist

## 2020-01-14 ENCOUNTER — Other Ambulatory Visit: Payer: Self-pay

## 2020-01-14 ENCOUNTER — Ambulatory Visit (HOSPITAL_COMMUNITY)
Admission: RE | Admit: 2020-01-14 | Discharge: 2020-01-15 | Disposition: A | Discharge status: Home / Self Care | Payer: 59 | Attending: Surgery | Admitting: Surgery

## 2020-01-14 DIAGNOSIS — E119 Type 2 diabetes mellitus without complications: Secondary | ICD-10-CM | POA: Diagnosis not present

## 2020-01-14 DIAGNOSIS — G4733 Obstructive sleep apnea (adult) (pediatric): Secondary | ICD-10-CM | POA: Insufficient documentation

## 2020-01-14 DIAGNOSIS — G4730 Sleep apnea, unspecified: Secondary | ICD-10-CM | POA: Diagnosis not present

## 2020-01-14 DIAGNOSIS — Z85828 Personal history of other malignant neoplasm of skin: Secondary | ICD-10-CM | POA: Insufficient documentation

## 2020-01-14 DIAGNOSIS — F4310 Post-traumatic stress disorder, unspecified: Secondary | ICD-10-CM | POA: Insufficient documentation

## 2020-01-14 DIAGNOSIS — G40909 Epilepsy, unspecified, not intractable, without status epilepticus: Secondary | ICD-10-CM | POA: Diagnosis not present

## 2020-01-14 DIAGNOSIS — Z8719 Personal history of other diseases of the digestive system: Secondary | ICD-10-CM

## 2020-01-14 DIAGNOSIS — E1139 Type 2 diabetes mellitus with other diabetic ophthalmic complication: Secondary | ICD-10-CM | POA: Diagnosis not present

## 2020-01-14 DIAGNOSIS — Z8614 Personal history of Methicillin resistant Staphylococcus aureus infection: Secondary | ICD-10-CM | POA: Insufficient documentation

## 2020-01-14 DIAGNOSIS — K4090 Unilateral inguinal hernia, without obstruction or gangrene, not specified as recurrent: Secondary | ICD-10-CM | POA: Diagnosis not present

## 2020-01-14 DIAGNOSIS — Z885 Allergy status to narcotic agent status: Secondary | ICD-10-CM | POA: Insufficient documentation

## 2020-01-14 DIAGNOSIS — D649 Anemia, unspecified: Secondary | ICD-10-CM | POA: Insufficient documentation

## 2020-01-14 DIAGNOSIS — Z881 Allergy status to other antibiotic agents status: Secondary | ICD-10-CM | POA: Insufficient documentation

## 2020-01-14 DIAGNOSIS — K222 Esophageal obstruction: Secondary | ICD-10-CM | POA: Diagnosis not present

## 2020-01-14 DIAGNOSIS — Z86010 Personal history of colonic polyps: Secondary | ICD-10-CM | POA: Insufficient documentation

## 2020-01-14 DIAGNOSIS — D500 Iron deficiency anemia secondary to blood loss (chronic): Secondary | ICD-10-CM | POA: Diagnosis not present

## 2020-01-14 DIAGNOSIS — I10 Essential (primary) hypertension: Secondary | ICD-10-CM | POA: Diagnosis not present

## 2020-01-14 DIAGNOSIS — Z9849 Cataract extraction status, unspecified eye: Secondary | ICD-10-CM | POA: Insufficient documentation

## 2020-01-14 DIAGNOSIS — F419 Anxiety disorder, unspecified: Secondary | ICD-10-CM | POA: Diagnosis not present

## 2020-01-14 DIAGNOSIS — Z8249 Family history of ischemic heart disease and other diseases of the circulatory system: Secondary | ICD-10-CM | POA: Insufficient documentation

## 2020-01-14 DIAGNOSIS — R0609 Other forms of dyspnea: Secondary | ICD-10-CM | POA: Insufficient documentation

## 2020-01-14 DIAGNOSIS — Z882 Allergy status to sulfonamides status: Secondary | ICD-10-CM | POA: Insufficient documentation

## 2020-01-14 DIAGNOSIS — G43909 Migraine, unspecified, not intractable, without status migrainosus: Secondary | ICD-10-CM | POA: Insufficient documentation

## 2020-01-14 DIAGNOSIS — Z880 Allergy status to penicillin: Secondary | ICD-10-CM | POA: Diagnosis not present

## 2020-01-14 DIAGNOSIS — Z791 Long term (current) use of non-steroidal anti-inflammatories (NSAID): Secondary | ICD-10-CM | POA: Insufficient documentation

## 2020-01-14 DIAGNOSIS — E785 Hyperlipidemia, unspecified: Secondary | ICD-10-CM | POA: Diagnosis not present

## 2020-01-14 DIAGNOSIS — Z87442 Personal history of urinary calculi: Secondary | ICD-10-CM | POA: Insufficient documentation

## 2020-01-14 DIAGNOSIS — Z888 Allergy status to other drugs, medicaments and biological substances status: Secondary | ICD-10-CM | POA: Diagnosis not present

## 2020-01-14 DIAGNOSIS — Z91018 Allergy to other foods: Secondary | ICD-10-CM | POA: Insufficient documentation

## 2020-01-14 DIAGNOSIS — Z91011 Allergy to milk products: Secondary | ICD-10-CM | POA: Insufficient documentation

## 2020-01-14 DIAGNOSIS — F329 Major depressive disorder, single episode, unspecified: Secondary | ICD-10-CM | POA: Diagnosis not present

## 2020-01-14 DIAGNOSIS — Z9889 Other specified postprocedural states: Secondary | ICD-10-CM | POA: Diagnosis present

## 2020-01-14 DIAGNOSIS — K219 Gastro-esophageal reflux disease without esophagitis: Secondary | ICD-10-CM | POA: Insufficient documentation

## 2020-01-14 DIAGNOSIS — H42 Glaucoma in diseases classified elsewhere: Secondary | ICD-10-CM | POA: Diagnosis not present

## 2020-01-14 DIAGNOSIS — K5080 Crohn's disease of both small and large intestine without complications: Secondary | ICD-10-CM | POA: Insufficient documentation

## 2020-01-14 DIAGNOSIS — Z823 Family history of stroke: Secondary | ICD-10-CM | POA: Insufficient documentation

## 2020-01-14 DIAGNOSIS — M1990 Unspecified osteoarthritis, unspecified site: Secondary | ICD-10-CM | POA: Insufficient documentation

## 2020-01-14 DIAGNOSIS — Z79899 Other long term (current) drug therapy: Secondary | ICD-10-CM | POA: Insufficient documentation

## 2020-01-14 DIAGNOSIS — Z8349 Family history of other endocrine, nutritional and metabolic diseases: Secondary | ICD-10-CM | POA: Insufficient documentation

## 2020-01-14 HISTORY — PX: INGUINAL HERNIA REPAIR: SHX194

## 2020-01-14 LAB — GLUCOSE, CAPILLARY
Glucose-Capillary: 92 mg/dL (ref 70–99)
Glucose-Capillary: 100 mg/dL — ABNORMAL HIGH (ref 70–99)

## 2020-01-14 SURGERY — REPAIR, HERNIA, INGUINAL, ADULT
Anesthesia: General | Laterality: Right

## 2020-01-14 MED ORDER — FENTANYL CITRATE (PF) 100 MCG/2ML IJ SOLN
INTRAMUSCULAR | Status: AC
  Filled 2020-01-14: qty 2

## 2020-01-14 MED ORDER — ACETAMINOPHEN 500 MG PO TABS
1000.0000 mg | ORAL_TABLET | ORAL | Status: AC
  Administered 2020-01-14: 1000 mg via ORAL
  Filled 2020-01-14: qty 2

## 2020-01-14 MED ORDER — LAMOTRIGINE 25 MG PO TABS
150.0000 mg | ORAL_TABLET | Freq: Two times a day (BID) | ORAL | Status: DC
  Administered 2020-01-14 – 2020-01-15 (×2): 150 mg via ORAL
  Filled 2020-01-14 (×2): qty 1

## 2020-01-14 MED ORDER — EPHEDRINE SULFATE-NACL 50-0.9 MG/10ML-% IV SOSY
PREFILLED_SYRINGE | INTRAVENOUS | Status: DC | PRN
  Administered 2020-01-14 (×4): 10 mg via INTRAVENOUS

## 2020-01-14 MED ORDER — CHLORHEXIDINE GLUCONATE 4 % EX LIQD
60.0000 mL | Freq: Once | CUTANEOUS | Status: DC
  Administered 2020-01-14: 4 via TOPICAL

## 2020-01-14 MED ORDER — BUPIVACAINE-EPINEPHRINE 0.25% -1:200000 IJ SOLN
INTRAMUSCULAR | Status: DC | PRN
  Administered 2020-01-14: 10 mL

## 2020-01-14 MED ORDER — METHSCOPOLAMINE BROMIDE 5 MG PO TABS
5.0000 mg | ORAL_TABLET | Freq: Two times a day (BID) | ORAL | Status: DC
  Administered 2020-01-15: 10:00:00 5 mg via ORAL

## 2020-01-14 MED ORDER — FENTANYL CITRATE (PF) 100 MCG/2ML IJ SOLN: 12.5000 ug | Freq: Four times a day (QID) | INTRAMUSCULAR | Status: DC | PRN

## 2020-01-14 MED ORDER — ROCURONIUM BROMIDE 10 MG/ML (PF) SYRINGE
PREFILLED_SYRINGE | INTRAVENOUS | Status: DC | PRN
  Administered 2020-01-14: 50 mg via INTRAVENOUS
  Administered 2020-01-14 (×2): 10 mg via INTRAVENOUS

## 2020-01-14 MED ORDER — TRAMADOL HCL 50 MG PO TABS
50.0000 mg | ORAL_TABLET | Freq: Four times a day (QID) | ORAL | Status: DC | PRN
  Administered 2020-01-14: 17:00:00 50 mg via ORAL
  Filled 2020-01-14: qty 1

## 2020-01-14 MED ORDER — FENTANYL CITRATE (PF) 100 MCG/2ML IJ SOLN
50.0000 ug | Freq: Once | INTRAMUSCULAR | Status: AC
  Administered 2020-01-14: 50 ug via INTRAVENOUS
  Filled 2020-01-14: qty 2

## 2020-01-14 MED ORDER — ONDANSETRON 4 MG PO TBDP: 4.0000 mg | ORAL_TABLET | Freq: Four times a day (QID) | ORAL | Status: DC | PRN

## 2020-01-14 MED ORDER — OXYCODONE HCL 5 MG PO TABS: 5.0000 mg | ORAL_TABLET | Freq: Once | ORAL | Status: DC | PRN

## 2020-01-14 MED ORDER — ONDANSETRON HCL 4 MG/2ML IJ SOLN
INTRAMUSCULAR | Status: DC | PRN
  Administered 2020-01-14: 4 mg via INTRAVENOUS

## 2020-01-14 MED ORDER — ORAL CARE MOUTH RINSE: 15.0000 mL | Freq: Once | OROMUCOSAL | Status: AC

## 2020-01-14 MED ORDER — PANTOPRAZOLE SODIUM 40 MG PO TBEC
40.0000 mg | DELAYED_RELEASE_TABLET | Freq: Every day | ORAL | Status: DC
  Administered 2020-01-15: 40 mg via ORAL
  Filled 2020-01-14: qty 1

## 2020-01-14 MED ORDER — FENTANYL CITRATE (PF) 100 MCG/2ML IJ SOLN
25.0000 ug | INTRAMUSCULAR | Status: DC | PRN
  Administered 2020-01-14 (×2): 50 ug via INTRAVENOUS

## 2020-01-14 MED ORDER — VANCOMYCIN HCL IN DEXTROSE 1-5 GM/200ML-% IV SOLN
1000.0000 mg | INTRAVENOUS | Status: DC
  Administered 2020-01-14: 1000 mg via INTRAVENOUS
  Filled 2020-01-14: qty 200

## 2020-01-14 MED ORDER — OXYCODONE HCL 5 MG/5ML PO SOLN: 5.0000 mg | Freq: Once | ORAL | Status: DC | PRN

## 2020-01-14 MED ORDER — MESALAMINE 1.2 G PO TBEC
1.2000 g | DELAYED_RELEASE_TABLET | Freq: Every day | ORAL | Status: DC
  Administered 2020-01-15: 09:00:00 1.2 g via ORAL
  Filled 2020-01-14: qty 1

## 2020-01-14 MED ORDER — 0.9 % SODIUM CHLORIDE (POUR BTL) OPTIME
TOPICAL | Status: DC | PRN
  Administered 2020-01-14: 1000 mL

## 2020-01-14 MED ORDER — METOPROLOL TARTRATE 5 MG/5ML IV SOLN: 5.0000 mg | Freq: Four times a day (QID) | INTRAVENOUS | Status: DC | PRN

## 2020-01-14 MED ORDER — ONDANSETRON HCL 4 MG/2ML IJ SOLN: 4.0000 mg | Freq: Four times a day (QID) | INTRAMUSCULAR | Status: DC | PRN

## 2020-01-14 MED ORDER — LIDOCAINE 2% (20 MG/ML) 5 ML SYRINGE
INTRAMUSCULAR | Status: DC | PRN
  Administered 2020-01-14: 60 mg via INTRAVENOUS

## 2020-01-14 MED ORDER — OXYCODONE HCL 5 MG PO TABS: 5.0000 mg | ORAL_TABLET | Freq: Three times a day (TID) | ORAL | Status: DC | PRN

## 2020-01-14 MED ORDER — ELETRIPTAN HYDROBROMIDE 20 MG PO TABS
20.0000 mg | ORAL_TABLET | ORAL | Status: DC | PRN
  Filled 2020-01-14: qty 1

## 2020-01-14 MED ORDER — PHENYLEPHRINE 40 MCG/ML (10ML) SYRINGE FOR IV PUSH (FOR BLOOD PRESSURE SUPPORT)
PREFILLED_SYRINGE | INTRAVENOUS | Status: DC | PRN
  Administered 2020-01-14: 80 ug via INTRAVENOUS

## 2020-01-14 MED ORDER — PROPOFOL 10 MG/ML IV BOLUS
INTRAVENOUS | Status: DC | PRN
  Administered 2020-01-14: 140 mg via INTRAVENOUS

## 2020-01-14 MED ORDER — IBUPROFEN 400 MG PO TABS
600.0000 mg | ORAL_TABLET | Freq: Four times a day (QID) | ORAL | Status: DC | PRN
  Administered 2020-01-14: 21:00:00 600 mg via ORAL
  Filled 2020-01-14: qty 1

## 2020-01-14 MED ORDER — ACETAMINOPHEN 500 MG PO TABS
1000.0000 mg | ORAL_TABLET | Freq: Four times a day (QID) | ORAL | Status: DC
  Administered 2020-01-14 – 2020-01-15 (×3): 1000 mg via ORAL
  Filled 2020-01-14 (×3): qty 2

## 2020-01-14 MED ORDER — TRAMADOL HCL 50 MG PO TABS: 50.0000 mg | ORAL_TABLET | Freq: Four times a day (QID) | ORAL | 0 refills | Status: DC | PRN

## 2020-01-14 MED ORDER — FENTANYL CITRATE (PF) 100 MCG/2ML IJ SOLN
INTRAMUSCULAR | Status: AC
  Administered 2020-01-14: 50 ug via INTRAVENOUS
  Filled 2020-01-14: qty 2

## 2020-01-14 MED ORDER — FENTANYL CITRATE (PF) 100 MCG/2ML IJ SOLN
INTRAMUSCULAR | Status: DC | PRN
  Administered 2020-01-14: 50 ug via INTRAVENOUS

## 2020-01-14 MED ORDER — MIDAZOLAM HCL 2 MG/2ML IJ SOLN
1.0000 mg | INTRAMUSCULAR | Status: DC
  Filled 2020-01-14: qty 2

## 2020-01-14 MED ORDER — SUGAMMADEX SODIUM 500 MG/5ML IV SOLN
INTRAVENOUS | Status: AC
  Filled 2020-01-14: qty 5

## 2020-01-14 MED ORDER — TRAZODONE HCL 50 MG PO TABS
150.0000 mg | ORAL_TABLET | Freq: Every day | ORAL | Status: DC
  Administered 2020-01-14: 21:00:00 150 mg via ORAL
  Filled 2020-01-14: qty 1

## 2020-01-14 MED ORDER — SERTRALINE HCL 50 MG PO TABS
200.0000 mg | ORAL_TABLET | Freq: Every day | ORAL | Status: DC
  Administered 2020-01-15: 09:00:00 200 mg via ORAL
  Filled 2020-01-14: qty 4

## 2020-01-14 MED ORDER — MIDAZOLAM HCL 2 MG/2ML IJ SOLN
INTRAMUSCULAR | Status: AC
  Filled 2020-01-14: qty 2

## 2020-01-14 MED ORDER — EPHEDRINE 5 MG/ML INJ
INTRAVENOUS | Status: AC
  Filled 2020-01-14: qty 10

## 2020-01-14 MED ORDER — BUPIVACAINE-EPINEPHRINE 0.25% -1:200000 IJ SOLN
INTRAMUSCULAR | Status: AC
  Filled 2020-01-14: qty 1

## 2020-01-14 MED ORDER — BUPIVACAINE LIPOSOME 1.3 % IJ SUSP
20.0000 mL | Freq: Once | INTRAMUSCULAR | Status: DC
  Filled 2020-01-14: qty 20

## 2020-01-14 MED ORDER — HYDRALAZINE HCL 20 MG/ML IJ SOLN: 10.0000 mg | INTRAMUSCULAR | Status: DC | PRN

## 2020-01-14 MED ORDER — LACTATED RINGERS IV SOLN: INTRAVENOUS | Status: DC

## 2020-01-14 MED ORDER — ROPIVACAINE HCL 5 MG/ML IJ SOLN
INTRAMUSCULAR | Status: DC | PRN
  Administered 2020-01-14: 30 mL via PERINEURAL

## 2020-01-14 MED ORDER — CHLORHEXIDINE GLUCONATE 4 % EX LIQD: 60.0000 mL | Freq: Once | CUTANEOUS | Status: DC

## 2020-01-14 MED ORDER — SUGAMMADEX SODIUM 200 MG/2ML IV SOLN
INTRAVENOUS | Status: DC | PRN
  Administered 2020-01-14: 200 mg via INTRAVENOUS

## 2020-01-14 MED ORDER — DEXAMETHASONE SODIUM PHOSPHATE 10 MG/ML IJ SOLN
INTRAMUSCULAR | Status: DC | PRN
  Administered 2020-01-14: 5 mg via INTRAVENOUS

## 2020-01-14 MED ORDER — MERCAPTOPURINE 50 MG PO TABS
100.0000 mg | ORAL_TABLET | Freq: Every day | ORAL | Status: DC
  Administered 2020-01-15: 100 mg via ORAL
  Filled 2020-01-14: qty 2

## 2020-01-14 MED ORDER — CHLORHEXIDINE GLUCONATE 0.12 % MT SOLN
15.0000 mL | Freq: Once | OROMUCOSAL | Status: AC
  Administered 2020-01-14: 15 mL via OROMUCOSAL

## 2020-01-14 SURGICAL SUPPLY — 41 items
APL PRP STRL LF DISP 70% ISPRP (MISCELLANEOUS) ×1
APL SKNCLS STERI-STRIP NONHPOA (GAUZE/BANDAGES/DRESSINGS) ×1
BENZOIN TINCTURE PRP APPL 2/3 (GAUZE/BANDAGES/DRESSINGS) ×2 IMPLANT
BLADE SURG 15 STRL LF DISP TIS (BLADE) ×1 IMPLANT
BLADE SURG 15 STRL SS (BLADE) ×2
CHLORAPREP W/TINT 26 (MISCELLANEOUS) ×2 IMPLANT
CLSR STERI-STRIP ANTIMIC 1/2X4 (GAUZE/BANDAGES/DRESSINGS) ×1 IMPLANT
COVER SURGICAL LIGHT HANDLE (MISCELLANEOUS) ×2 IMPLANT
COVER WAND RF STERILE (DRAPES) IMPLANT
DECANTER SPIKE VIAL GLASS SM (MISCELLANEOUS) ×2 IMPLANT
DRAIN PENROSE 0.5X18 (DRAIN) ×2 IMPLANT
DRAPE LAPAROSCOPIC ABDOMINAL (DRAPES) ×2 IMPLANT
DRSG TEGADERM 4X4.75 (GAUZE/BANDAGES/DRESSINGS) ×1 IMPLANT
ELECT REM PT RETURN 15FT ADLT (MISCELLANEOUS) ×2 IMPLANT
GAUZE SPONGE 4X4 12PLY STRL (GAUZE/BANDAGES/DRESSINGS) IMPLANT
GLOVE BIO SURGEON STRL SZ 6 (GLOVE) ×2 IMPLANT
GLOVE INDICATOR 6.5 STRL GRN (GLOVE) ×2 IMPLANT
GOWN STRL REUS W/TWL LRG LVL3 (GOWN DISPOSABLE) ×2 IMPLANT
GOWN STRL REUS W/TWL XL LVL3 (GOWN DISPOSABLE) ×2 IMPLANT
KIT BASIN OR (CUSTOM PROCEDURE TRAY) ×2 IMPLANT
KIT TURNOVER KIT A (KITS) IMPLANT
MESH ULTRAPRO 3X6 7.6X15CM (Mesh General) ×1 IMPLANT
NEEDLE HYPO 22GX1.5 SAFETY (NEEDLE) ×2 IMPLANT
PACK BASIC VI WITH GOWN DISP (CUSTOM PROCEDURE TRAY) ×2 IMPLANT
PENCIL SMOKE EVACUATOR (MISCELLANEOUS) IMPLANT
SPONGE LAP 4X18 RFD (DISPOSABLE) ×2 IMPLANT
STRIP CLOSURE SKIN 1/2X4 (GAUZE/BANDAGES/DRESSINGS) ×2 IMPLANT
SUT ETHIBOND 0 MO6 C/R (SUTURE) ×2 IMPLANT
SUT MNCRL AB 4-0 PS2 18 (SUTURE) ×2 IMPLANT
SUT SILK 3 0 (SUTURE) ×2
SUT SILK 3-0 18XBRD TIE 12 (SUTURE) ×1 IMPLANT
SUT VIC AB 3-0 SH 27 (SUTURE) ×4
SUT VIC AB 3-0 SH 27XBRD (SUTURE) ×2 IMPLANT
SUT VICRYL 0 UR6 27IN ABS (SUTURE) ×2 IMPLANT
SUT VICRYL 3 0 BR 18  UND (SUTURE) ×2
SUT VICRYL 3 0 BR 18 UND (SUTURE) ×1 IMPLANT
SYR CONTROL 10ML LL (SYRINGE) ×2 IMPLANT
TOWEL OR 17X26 10 PK STRL BLUE (TOWEL DISPOSABLE) ×2 IMPLANT
TOWEL OR NON WOVEN STRL DISP B (DISPOSABLE) ×2 IMPLANT
TRAY FOLEY MTR SLVR 16FR STAT (SET/KITS/TRAYS/PACK) IMPLANT
YANKAUER SUCT BULB TIP 10FT TU (MISCELLANEOUS) IMPLANT

## 2020-01-14 NOTE — Op Note (Signed)
Operative Note  TAHER VANNOTE  [id number]  [id number]  01/14/2020   Surgeon: [name] MD FACS   Procedure performed: Open right inguinal hernia repair with UltraPro mesh   Preop diagnosis:  right inguinal hernia   Post-op diagnosis/intraop findings: right direct inguinal hernia   Specimens: none   EBL: 5cc   Complications: none   Description of procedure: After obtaining informed consent and placement of a TAPS block in holding by Dr. Marcie Bal, the patient was taken to the operating room and placed supine on operating room table where general anesthesia was initiated, preoperative antibiotics were administered, SCDs applied, and a formal timeout was performed.  Foley catheter was placed which is removed in the case.  The groin was clipped, prepped and draped in the usual sterile fashion. An oblique incision was made in the just above the inguinal ligament after infiltrating the tissues with local anesthetic. Soft tissues were dissected using electrocautery until the external oblique aponeurosis was encountered. This was divided sharply to expand the external ring. A plane was bluntly developed between the spermatic cord and the external oblique. The ilioinguinal nerve was identified, divided between hemostats and each end ligated with 3-0 Vicryl ties. The spermatic cord was then bluntly dissected away from the pubic tubercle and encircled with a Penrose. Inspection of the inguinal anatomy revealed a large direct sac and a small cord lipoma but no significant indirect sac.   This was confirmed by carefully dissecting the spermatic cord narrowing it down to only the vessels and vas with a small amount of surrounding adventitial tissue.  The small lipoma was dissected down to the level of the internal ring, clamped with a hemostat and excised.  The stalk was ligated with a 3-0 Vicryl tie.  The direct inguinal hernia was reduced, and interrupted 0 Vicryl simple and figure-of-eight sutures were  used to reconstruct the floor of the inguinal canal to flatten the surface and keep the hernia out of the field. A 3 x 6 piece of ultra Pro mesh was brought onto the field and trimmed to approximate the field. This was sutured to the pubic tubercle fascia using 0 ethibond. Interrupted 0 ethibonds were then used to secure the mesh to the inferior shelving edge and to the internal oblique superiorly. The tails of the mesh were wrapped around the spermatic cord, ensuring adequate room for the cord, and sutured to each other with 0 ethibond, and then directed laterally to lie flat. Hemostasis was ensured within the wound. The Penrose was removed. The external oblique aponeurosis was reapproximated with a running 3-0 Vicryl to re-create a narrowed external ring. The Scarpa's was reapproximated with interrupted 3-0 Vicryls. The skin was closed with a running subcuticular Monocryl. The field was then cleaned, benzoin and Steri-Strips and sterile bandage were applied. The patient was then awakened, extubated and taken to PACU in stable condition.    All counts were correct at the completion of the case

## 2020-01-14 NOTE — Transfer of Care (Signed)
Immediate Anesthesia Transfer of Care Note  Patient: [name] [name]  Procedure(s) Performed: OPEN RIGHT HERNIA REPAIR INGUINAL WITH MESH (Right )  Patient Location: PACU  Anesthesia Type:GA combined with regional for post-op pain  Level of Consciousness: awake, drowsy and patient cooperative  Airway & Oxygen Therapy: Patient Spontanous Breathing and Patient connected to face mask oxygen  Post-op Assessment: Report given to RN and Post -op Vital signs reviewed and stable  Post vital signs: Reviewed and stable  Last Vitals:  Vitals Value Taken Time  BP 143/71 01/14/20 1224  Temp    Pulse 89 01/14/20 1226  Resp 20 01/14/20 1226  SpO2 96 % 01/14/20 1226  Vitals shown include unvalidated device data.  Last Pain:  Vitals:   01/14/20 1033  TempSrc:   PainSc: 0-No pain         Complications: No complications documented.

## 2020-01-14 NOTE — Interval H&P Note (Signed)
History and Physical Interval Note:  01/14/2020 9:57 AM  Sharon Seller  has presented today for surgery, with the diagnosis of INGUINAL HERNIA.  The various methods of treatment have been discussed with the patient and family. After consideration of risks, benefits and other options for treatment, the patient has consented to  Procedure(s): OPEN RIGHT HERNIA REPAIR INGUINAL WITH MESH (Right) as a surgical intervention.  The patient's history has been reviewed, patient examined, no change in status, stable for surgery.  I have reviewed the patient's chart and labs.  Questions were answered to the patient's satisfaction.     Baelynn Schmuhl Rich Brave

## 2020-01-14 NOTE — Anesthesia Preprocedure Evaluation (Signed)
Anesthesia Evaluation  Patient identified by MRN, date of birth, ID band Patient awake    Reviewed: Allergy & Precautions, H&P , NPO status , Patient's Chart, lab work & pertinent test results  History of Anesthesia Complications (+) PONV and history of anesthetic complications  Airway Mallampati: II   Neck ROM: full    Dental   Pulmonary sleep apnea ,    breath sounds clear to auscultation       Cardiovascular hypertension, + DOE   Rhythm:regular Rate:Normal     Neuro/Psych  Headaches, Seizures -,  PSYCHIATRIC DISORDERS Anxiety Depression    GI/Hepatic GERD  ,Esophageal stricture. Crohn's dz   Endo/Other  diabetes, Type 2  Renal/GU      Musculoskeletal  (+) Arthritis ,   Abdominal   Peds  Hematology  (+) Blood dyscrasia, anemia , PLTS 81   Anesthesia Other Findings   Reproductive/Obstetrics                             Anesthesia Physical Anesthesia Plan  ASA: III  Anesthesia Plan: General   Post-op Pain Management:  Regional for Post-op pain   Induction: Intravenous  PONV Risk Score and Plan: 3 and Ondansetron, Dexamethasone, Midazolam and Treatment may vary due to age or medical condition  Airway Management Planned: LMA  Additional Equipment:   Intra-op Plan:   Post-operative Plan: Extubation in OR  Informed Consent: I have reviewed the patients History and Physical, chart, labs and discussed the procedure including the risks, benefits and alternatives for the proposed anesthesia with the patient or authorized representative who has indicated his/her understanding and acceptance.       Plan Discussed with: CRNA, Anesthesiologist and Surgeon  Anesthesia Plan Comments:         Anesthesia Quick Evaluation

## 2020-01-14 NOTE — Progress Notes (Signed)
AssistedDr. Marcie Bal with right, ultrasound guided, transabdominal plane block. Side rails up, monitors on throughout procedure. See vital signs in flow sheet. Tolerated Procedure well.

## 2020-01-14 NOTE — Discharge Instructions (Signed)
HERNIA REPAIR: POST OP INSTRUCTIONS   EAT Gradually transition to a high fiber diet with a fiber supplement over the next few weeks after discharge.  Start with a pureed / full liquid diet (see below)  WALK Walk an hour a day (cumulative).  Control your pain to do that.    CONTROL PAIN Control pain so that you can walk, sleep, tolerate sneezing/coughing, and go up/down stairs.  HAVE A BOWEL MOVEMENT DAILY Keep your bowels regular to avoid problems.  OK to try a laxative to override constipation.  OK to use an antidairrheal to slow down diarrhea.  Call if not better after 2 tries  CALL IF YOU HAVE PROBLEMS/CONCERNS Call if you are still struggling despite following these instructions. Call if you have concerns not answered by these instructions    1. DIET: Follow a light bland diet & liquids the first 24 hours after arrival home, such as soup, liquids, starches, etc.  Be sure to drink plenty of fluids.  Quickly advance to a usual solid diet within a few days.  Avoid fast food or heavy meals as your are more likely to get nauseated or have irregular bowels.  A low-sugar, high-fiber diet for the rest of your life is ideal.   2. Take your usually prescribed home medications unless otherwise directed.  3. PAIN CONTROL: a. Pain is best controlled by a usual combination of three different methods TOGETHER: i. Ice/Heat ii. Over the counter pain medication iii. Prescription pain medication b. Most patients will experience some swelling and bruising around the hernia(s) such as the bellybutton, groins, or old incisions.  Ice packs or heating pads (30-60 minutes up to 6 times a day) will help. Use ice for the first few days to help decrease swelling and bruising, then switch to heat to help relax tight/sore spots and speed recovery.  Some people prefer to use ice alone, heat alone, alternating between ice & heat.  Experiment to what works for you.  Swelling and bruising can take several weeks to  resolve.   c. It is helpful to take an over-the-counter pain medication regularly for the first few days: i. Naproxen (Aleve, etc)  Two 265m tabs twice a day OR Ibuprofen (Advil, etc) Three 2048mtabs four times a day (every meal & bedtime) AND ii. Acetaminophen (Tylenol, etc) 325-6507mour times a day (every meal & bedtime) d. A  prescription for pain medication should be given to you upon discharge.  Take your pain medication as prescribed.  i. If you are having problems/concerns with the prescription medicine (does not control pain, nausea, vomiting, rash, itching, etc), please call us [phone] see if we need to switch you to a different pain medicine that will work better for you and/or control your side effect better. ii. If you need a refill on your pain medication, please contact your pharmacy.  They will contact our office to request authorization. Prescriptions will not be filled after 5 pm or on week-ends.  4. Avoid getting constipated.  Between the surgery and the pain medications, it is common to experience some constipation.  Increasing fluid intake and taking a fiber supplement (such as Metamucil, Citrucel, FiberCon, MiraLax, etc) 1-2 times a day regularly will usually help prevent this problem from occurring.  A mild laxative (prune juice, Milk of Magnesia, MiraLax, etc) should be taken according to package directions if there are no bowel movements after 48 hours.    5. Wash / shower every day, starting 2  days after surgery.  You may shower over the steri strips as they are waterproof.  No soaking or swimming. No rubbing, scrubbing, lotions or ointments to incision.  6. Remove your outer bandage 2 days after surgery. Steri strips will peel off after about 1-2 weeks. You may replace a dressing/Band-Aid to cover the incision for comfort if you wish. You may leave the incisions open to air.  You may replace a dressing/Band-Aid to cover an incision for comfort if you wish.   Continue to shower over incision(s) after the dressing is off.  7. ACTIVITIES as tolerated:   a. You may resume regular (light) daily activities beginning the next day--such as daily self-care, walking, climbing stairs--gradually increasing activities as tolerated.  Control your pain so that you can walk an hour a day.  If you can walk 30 minutes without difficulty, it is safe to try more intense activity such as jogging, treadmill, bicycling, low-impact aerobics, swimming, etc. b. Refrain from the most intensive and strenuous activity such as sit-ups, heavy lifting, contact sports, etc  Refrain from any heavy lifting or straining until 6 weeks after surgery.   c. DO NOT PUSH THROUGH PAIN.  Let pain be your guide: If it hurts to do something, don't do it.  Pain is your body warning you to avoid that activity for another week until the pain goes down. d. You may drive when you are no longer taking prescription pain medication, you can comfortably wear a seatbelt, and you can safely maneuver your car and apply brakes. e. Dennis Bast may have sexual intercourse when it is comfortable.   8. FOLLOW UP in our office a. Please call [facility] at [phone] to set up an appointment to see your surgeon in the office for a follow-up appointment approximately 2-3 weeks after your surgery. b. Make sure that you call for this appointment the day you arrive home to insure a convenient appointment time.  9.  If you have disability of FMLA / Family leave forms, please bring the forms to the office for processing.  (do not give to your surgeon).  WHEN TO CALL us [phone]: 1. Poor pain control 2. Reactions / problems with new medications (rash/itching, nausea, etc)  3. Fever over 101.5 F (38.5 C) 4. Inability to urinate 5. Nausea and/or vomiting 6. Worsening swelling or bruising 7. Continued bleeding from incision. 8. Increased pain, redness, or drainage from the incision   The clinic staff is available to answer  your questions during regular business hours (8:30am-5pm).  Please don't hesitate to call and ask to speak to one of our nurses for clinical concerns.   If you have a medical emergency, go to the nearest emergency room or call 911.  A surgeon from [facility] is always on call at the hospitals in [facility], [place], [place], [place], [place]  [address] ?  [address] [address], [place], [place]   [address] MAIN: [phone] ? TOLL FREE: [phone] ? FAX: [phone] [email or web address]

## 2020-01-14 NOTE — Anesthesia Procedure Notes (Signed)
Anesthesia Regional Block: TAP block   Pre-Anesthetic Checklist: ,, timeout performed, Correct Patient, Correct Site, Correct Laterality, Correct Procedure, Correct Position, site marked, Risks and benefits discussed,  Surgical consent,  Pre-op evaluation,  At surgeon's request and post-op pain management  Laterality: Right  Prep: chloraprep       Needles:  Injection technique: Single-shot  Needle Type: Echogenic Needle     Needle Length: 9cm  Needle Gauge: 21     Additional Needles:   Narrative:  Start time: 01/14/2020 10:28 AM End time: 01/14/2020 10:36 AM Injection made incrementally with aspirations every 5 mL.  Performed by: Personally  Anesthesiologist: Albertha Ghee, MD  Additional Notes: Pt tolerated the procedure well.

## 2020-01-14 NOTE — Anesthesia Procedure Notes (Signed)
Procedure Name: Intubation Date/Time: 01/14/2020 10:56 AM Performed by: Maxwell Caul, CRNA Pre-anesthesia Checklist: Patient identified, Emergency Drugs available, Suction available and Patient being monitored Patient Re-evaluated:Patient Re-evaluated prior to induction Oxygen Delivery Method: Circle system utilized Preoxygenation: Pre-oxygenation with 100% oxygen Induction Type: IV induction Ventilation: Mask ventilation without difficulty Laryngoscope Size: Mac and 4 Grade View: Grade I Tube type: Oral Tube size: 7.5 mm Number of attempts: 1 Airway Equipment and Method: Stylet Placement Confirmation: ETT inserted through vocal cords under direct vision,  positive ETCO2 and breath sounds checked- equal and bilateral Secured at: 22 cm Tube secured with: Tape Dental Injury: Teeth and Oropharynx as per pre-operative assessment

## 2020-01-15 DIAGNOSIS — K4090 Unilateral inguinal hernia, without obstruction or gangrene, not specified as recurrent: Secondary | ICD-10-CM | POA: Diagnosis not present

## 2020-01-15 LAB — COMPREHENSIVE METABOLIC PANEL
ALT: 18 U/L (ref 0–44)
AST: 42 U/L — ABNORMAL HIGH (ref 15–41)
Albumin: 3.9 g/dL (ref 3.5–5.0)
Alkaline Phosphatase: 73 U/L (ref 38–126)
Anion gap: 8 (ref 5–15)
BUN: 13 mg/dL (ref 6–20)
CO2: 27 mmol/L (ref 22–32)
Calcium: 8.7 mg/dL — ABNORMAL LOW (ref 8.9–10.3)
Chloride: 106 mmol/L (ref 98–111)
Creatinine, Ser: 0.72 mg/dL (ref 0.61–1.24)
GFR calc Af Amer: >60 mL/min (ref >60)
GFR calc non Af Amer: >60 mL/min (ref >60)
Glucose, Bld: 91 mg/dL (ref 70–99)
Potassium: 3.3 mmol/L — ABNORMAL LOW (ref 3.5–5.1)
Sodium: 141 mmol/L (ref 135–145)
Total Bilirubin: 4 mg/dL — ABNORMAL HIGH (ref 0.3–1.2)
Total Protein: 6.1 g/dL — ABNORMAL LOW (ref 6.5–8.1)

## 2020-01-15 LAB — CBC
HCT: 27.7 % — ABNORMAL LOW (ref 39.0–52.0)
Hemoglobin: 9.8 g/dL — ABNORMAL LOW (ref 13.0–17.0)
MCH: 39.2 pg — ABNORMAL HIGH (ref 26.0–34.0)
MCHC: 35.4 g/dL (ref 30.0–36.0)
MCV: 110.8 fL — ABNORMAL HIGH (ref 80.0–100.0)
Platelets: 75 10*3/uL — ABNORMAL LOW (ref 150–400)
RBC: 2.5 MIL/uL — ABNORMAL LOW (ref 4.22–5.81)
RDW: 15.1 % (ref 11.5–15.5)
WBC: 2.8 10*3/uL — ABNORMAL LOW (ref 4.0–10.5)
nRBC: 0 % (ref 0.0–0.2)

## 2020-01-15 NOTE — Progress Notes (Signed)
Discussed discharge instructions with patient and spouse.  Answered all question.  Patient was taken to main exit by wheelchair.

## 2020-01-15 NOTE — Progress Notes (Signed)
1 Day Post-Op   Subjective/Chief Complaint: No complaints   Objective: Vital signs in last 24 hours: Temp:  [97.8 F (36.6 C)-98.6 F (37 C)] 97.8 F (36.6 C) (07/24 0130) Pulse Rate:  [64-99] 67 (07/24 0620) Resp:  [3-27] 16 (07/24 0620) BP: (101-170)/(64-85) 101/69 (07/24 0620) SpO2:  [94 %-100 %] 98 % (07/24 0620) Weight:  [85.7 kg] 85.7 kg (07/23 1002) Last BM Date: 01/13/20  Intake/Output from previous day: 07/23 0701 - 07/24 0700 In: 968.7 [P.O.:690; I.V.:78.7; IV Piggyback:200] Out: 2200 [Urine:2200] Intake/Output this shift: No intake/output data recorded.  General appearance: alert and cooperative Resp: clear to auscultation bilaterally Cardio: regular rate and rhythm GI: soft, nontender. incision looks good  Lab Results:  Recent Labs    01/15/20 0609  WBC 2.8*  HGB 9.8*  HCT 27.7*  PLT 75*   BMET Recent Labs    01/15/20 0609  NA 141  K 3.3*  CL 106  CO2 27  GLUCOSE 91  BUN 13  CREATININE 0.72  CALCIUM 8.7*   PT/INR No results for input(s): LABPROT, INR in the last 72 hours. ABG No results for input(s): PHART, HCO3 in the last 72 hours.  Invalid input(s): PCO2, PO2  Studies/Results: No results found.  Anti-infectives: Anti-infectives (From admission, onward)   Start     Dose/Rate Route Frequency Ordered Stop   01/14/20 1000  vancomycin (VANCOCIN) IVPB 1000 mg/200 mL premix  Status:  Discontinued        1,000 mg 200 mL/hr over 60 Minutes Intravenous On call to O.R. 01/14/20 0950 01/14/20 1109      Assessment/Plan: s/p Procedure(s): OPEN RIGHT HERNIA REPAIR INGUINAL WITH MESH (Right) Advance diet Discharge  LOS: 0 days    Autumn Messing III 01/15/2020

## 2020-01-17 ENCOUNTER — Encounter (HOSPITAL_COMMUNITY): Payer: Self-pay | Admitting: Surgery

## 2020-01-18 NOTE — Anesthesia Postprocedure Evaluation (Signed)
Anesthesia Post Note  Patient: Sharon Seller  Procedure(s) Performed: OPEN RIGHT HERNIA REPAIR INGUINAL WITH MESH (Right )     Patient location during evaluation: PACU Anesthesia Type: General Level of consciousness: awake and alert Pain management: pain level controlled Vital Signs Assessment: post-procedure vital signs reviewed and stable Respiratory status: spontaneous breathing, nonlabored ventilation, respiratory function stable and patient connected to nasal cannula oxygen Cardiovascular status: blood pressure returned to baseline and stable Postop Assessment: no apparent nausea or vomiting Anesthetic complications: no   No complications documented.  Last Vitals:  Vitals:   01/15/20 0620 01/15/20 0917  BP: 101/69 115/69  Pulse: 67 66  Resp: 16 18  Temp:  36.8 C  SpO2: 98% 97%    Last Pain:  Vitals:   01/15/20 1021  TempSrc:   PainSc: 3                  Bera Pinela S

## 2020-02-11 ENCOUNTER — Telehealth: Payer: Self-pay | Admitting: Gastroenterology

## 2020-02-11 NOTE — Telephone Encounter (Signed)
Pt wife returning Sheri's My Chart message

## 2020-02-11 NOTE — Telephone Encounter (Signed)
Yes, ok for CT AP in place of [place] this time.

## 2020-02-11 NOTE — Telephone Encounter (Signed)
Patient is due for his RUS. He is scheduled for a CT abd/pelvis with another provider.  Wife is asking if this can substitute for [place] this time?

## 2020-02-11 NOTE — Telephone Encounter (Signed)
Patient's wife notified.  Will look for review once read on 02/21/20

## 2020-02-18 ENCOUNTER — Telehealth: Payer: Self-pay | Admitting: Family Medicine

## 2020-02-18 NOTE — Telephone Encounter (Signed)
Pt needs authorization for CT scan

## 2020-02-18 NOTE — Telephone Encounter (Signed)
Pt wife called regarding pt CT referral. Pts wife states that the referral was never sent in to insurance so they are not covering it. Pt would like a call regarding this to clear it up so pt can have CT scan. Please advise.

## 2020-02-21 ENCOUNTER — Telehealth: Payer: Self-pay | Admitting: Gastroenterology

## 2020-02-21 ENCOUNTER — Other Ambulatory Visit: Payer: 59

## 2020-02-21 DIAGNOSIS — K5080 Crohn's disease of both small and large intestine without complications: Secondary | ICD-10-CM

## 2020-02-21 DIAGNOSIS — K7460 Unspecified cirrhosis of liver: Secondary | ICD-10-CM

## 2020-02-21 NOTE — Telephone Encounter (Signed)
Patient's planned CT was cancelled because ordering office failed to obtain prior aut.  Patient and his wife would like to proceed with the RUQ [place] previously recommended by Dr. Fuller Plan.  He has been scheduled at [facility] 03/01/20 9:30 arrival at 9:15 and NPO after midnight. Patient's wife has been notified of the appt date and time and instructions.

## 2020-03-01 ENCOUNTER — Ambulatory Visit (HOSPITAL_COMMUNITY)
Admission: RE | Admit: 2020-03-01 | Discharge: 2020-03-01 | Disposition: A | Discharge status: Home / Self Care | Payer: 59 | Source: Ambulatory Visit | Attending: Gastroenterology | Admitting: Gastroenterology

## 2020-03-01 ENCOUNTER — Other Ambulatory Visit: Payer: Self-pay

## 2020-03-01 DIAGNOSIS — K5080 Crohn's disease of both small and large intestine without complications: Secondary | ICD-10-CM | POA: Diagnosis present

## 2020-03-02 ENCOUNTER — Telehealth: Payer: Self-pay | Admitting: Gastroenterology

## 2020-03-02 NOTE — Telephone Encounter (Signed)
Patient's wife returned your call about results, please call her one more time.

## 2020-03-02 NOTE — Telephone Encounter (Signed)
See ultrasound report results.

## 2020-03-07 NOTE — Telephone Encounter (Signed)
Pt needed PA for scan

## 2020-03-15 DIAGNOSIS — Z961 Presence of intraocular lens: Secondary | ICD-10-CM | POA: Diagnosis not present

## 2020-03-15 DIAGNOSIS — E119 Type 2 diabetes mellitus without complications: Secondary | ICD-10-CM | POA: Diagnosis not present

## 2020-03-15 DIAGNOSIS — H401233 Low-tension glaucoma, bilateral, severe stage: Secondary | ICD-10-CM | POA: Diagnosis not present

## 2020-03-15 DIAGNOSIS — H04123 Dry eye syndrome of bilateral lacrimal glands: Secondary | ICD-10-CM | POA: Diagnosis not present

## 2020-03-15 DIAGNOSIS — H469 Unspecified optic neuritis: Secondary | ICD-10-CM | POA: Diagnosis not present

## 2020-03-20 ENCOUNTER — Ambulatory Visit (INDEPENDENT_AMBULATORY_CARE_PROVIDER_SITE_OTHER): Payer: 59 | Admitting: Gastroenterology

## 2020-03-20 ENCOUNTER — Encounter: Payer: Self-pay | Admitting: Gastroenterology

## 2020-03-20 VITALS — BP 122/82 | HR 79 | Ht 71.0 in | Wt 186.2 lb

## 2020-03-20 DIAGNOSIS — K7460 Unspecified cirrhosis of liver: Secondary | ICD-10-CM | POA: Diagnosis not present

## 2020-03-20 DIAGNOSIS — Z86010 Personal history of colonic polyps: Secondary | ICD-10-CM

## 2020-03-20 DIAGNOSIS — K5080 Crohn's disease of both small and large intestine without complications: Secondary | ICD-10-CM

## 2020-03-20 DIAGNOSIS — Z23 Encounter for immunization: Secondary | ICD-10-CM

## 2020-03-20 NOTE — Patient Instructions (Addendum)
[place] [facility] will contact you with an appointment date and time.   Your next [facility] is scheduled for 04/20/20 at 2:00pm.   Thank you for choosing me and [facility].  Pricilla Riffle. Dagoberto Ligas., MD., Marval Regal

## 2020-03-20 NOTE — Progress Notes (Signed)
History of Present Illness: This is a 59 year old male returning for follow-up of Crohn's ileocolitis, cirrhosis, GERD. He is accompanied by his wife. After his last office visit in February he stopped 6-MP however after a week he noted an increase in diarrhea so he resumed 6-MP. He is most concerned about his cognitive decline and substantial difficulties with short-term memory. He has chronic mild right lower quadrant pain and chronic diarrhea and these symptoms are unchanged. Colonoscopy was performed in May 2020 that showed 3 tubular adenomatous colon polyps and no ileal or colonic inflammation.   Current Medications, Allergies, Past Medical History, Past Surgical History, Family History and Social History were reviewed in [employer] record.  Physical Exam: General: Well developed, well nourished, no acute distress Head: Normocephalic and atraumatic Eyes:  sclerae anicteric, EOMI Ears: Normal auditory acuity Mouth: Not examined, mask on during Covid-19 pandemic Lungs: Clear throughout to auscultation Heart: Regular rate and rhythm; no murmurs, rubs or bruits Abdomen: Soft, mild RLQ tenderness and non distended. No masses, hepatosplenomegaly or hernias noted. Normal Bowel sounds Rectal: Not done Musculoskeletal: Symmetrical with no gross deformities  Pulses:  Normal pulses noted Extremities: No clubbing, cyanosis, edema or deformities noted Neurological: Alert oriented x 4, grossly nonfocal Psychological:  Alert and cooperative. Normal mood and affect   Assessment and Recommendations:  1. Crohn's ileocolitis and IBS-D. Avoid foods that trigger symptoms.  Continue 6-MP 100 mg qd and Lialda 2.4 qd.  Continue Pamine Forte 5 mg p.o. twice daily and Imodium 3 times daily as needed.  Avoid foods, beverages that trigger diarrhea.  CBC, CMP, ESR, CRP in 6 months. REV in 6 months.   2.  Cirrhosis presumed secondary to NASH.  Mild HE appears to be controlled.   Continue Xifaxan 550 mg po bid. RUQ [place] and above blood work plus AFP, ammonia, PT/INR in 6 months. Hep A & B vaccination. No more than 2g Tylenol daily. Continue no etoh. REV in 6 months.    3. GERD. Continue Nexium 40 mg po bid. Follow antireflux measures.  4. Personal history of adenomatous colon polyps. Surveillance colonoscopy in 10/2023.

## 2020-04-09 ENCOUNTER — Other Ambulatory Visit: Payer: Self-pay | Admitting: Gastroenterology

## 2020-04-20 ENCOUNTER — Ambulatory Visit (INDEPENDENT_AMBULATORY_CARE_PROVIDER_SITE_OTHER): Payer: 59 | Admitting: Gastroenterology

## 2020-04-20 DIAGNOSIS — Z23 Encounter for immunization: Secondary | ICD-10-CM

## 2020-04-24 ENCOUNTER — Telehealth: Payer: Self-pay | Admitting: Family Medicine

## 2020-04-24 DIAGNOSIS — G4733 Obstructive sleep apnea (adult) (pediatric): Secondary | ICD-10-CM

## 2020-04-24 NOTE — Telephone Encounter (Signed)
Pt. States his CPAP machine has broken & he needs another one. Pt. Would like RN to contact insurance to see if they will cover another CPAP machine.

## 2020-04-24 NOTE — Telephone Encounter (Signed)
Pt's wife [name] said she called the DME company and she was told to get a new prescription for a loaner or new cpap.

## 2020-04-25 NOTE — Telephone Encounter (Signed)
Order for new cpap machine sent to [employer] via community msg. Confirmation received that the order transmitted was successful.

## 2020-04-25 NOTE — Addendum Note (Signed)
Addended by: Debbora Presto L on: 04/25/2020 07:22 AM   Modules accepted: Orders

## 2020-04-25 NOTE — Telephone Encounter (Signed)
Pt returning phone call. Would like a call back from the nurse.

## 2020-06-14 ENCOUNTER — Other Ambulatory Visit: Payer: Self-pay | Admitting: Family Medicine

## 2020-06-14 DIAGNOSIS — R319 Hematuria, unspecified: Secondary | ICD-10-CM

## 2020-06-15 ENCOUNTER — Ambulatory Visit (HOSPITAL_COMMUNITY): Payer: Self-pay

## 2020-06-16 ENCOUNTER — Ambulatory Visit: Payer: Self-pay

## 2020-06-21 ENCOUNTER — Ambulatory Visit: Payer: 59 | Admitting: Family Medicine

## 2020-06-29 NOTE — Progress Notes (Signed)
PATIENT: [name] [name] DOB: 07/23/1960  REASON FOR VISIT: follow up HISTORY FROM: patient  Chief Complaint  Patient presents with  . Follow-up    RM 1 with wife (karen) Pt is well, has had about one a month since last visit      HISTORY OF PRESENT ILLNESS: Today 07/03/20  [name] [name] returns for follow up. He is using CPAP nightly without concerns. His old machine was broken and new machine received about 1 month ago. He is doing well with new machine. Compliance report reveals 100% compliance from daily and 4 hour usage. Residual AHI 3.8 on 6-16cmH20 and EPR 3. No significant leak noted.   He continues lamotrigine 150mg  BID. No recent seizures. He was referred to [facility] by PCP for concerns of dizziness, memory loss. Workup consistent with nonepileptic events. He continues lamotrigine 150mg  BID for previous history of partial seizures with lip smacking and starring. Ambulatory EEG declined by patient. Psychiatry and psychology continue to follow regularly. He feels mood is stable.    Neurophthalmology is following him for glaucoma and optic neuropathy. He has significant peripheral vision loss contributing to imbalance and dizziness. He was evaluated by OT. He is seen annually at this time and sees a local ophthalmologist every 3 months for pressure checks.   He is now seeing Dr Barnet Pall, for PCP needs and Dr Russella Dar, GI, for cirrhosis. He has liver US every 6 months.    History (copied from previous note)  [name] [name] is a 60 y.o. male here today for follow up for seizures and CPAP compliance. He continues lamotrigine 150mg  twice daily. He feels that he is stable from a seizure perspective. He does continues to have spells where he has an overwhelming feeling that comes over him. In the summer he may start sweating. He feels that he is unable to respond to what is going on around him but is completely aware of his surroundings. Sometimes he does not recognize who family members are  for a few hours. Memory always returns. This happens about 10 times a year.  No LOC, tongue biting or incontinence. Multiple changes in epileptic medications have not improved spells. He feels they happen 1-2 times a month.   He continues to see psychiatry monthly and counselor every other week. He continues Zoloft and Haldol. He feels he is doing fairly well. He continues to see PCP regularly. He has had three falls in the past three years. He reports getting dizzy after about 15 minutes of walking. He reports that PCP is following and he has scheduled follow up next week.   Compliance report dated 05/31/2019 through 06/29/2019 reveals that he used CPAP 29 of the last 30 days for compliance of 97%. 25 of the last 30 days he used CPAP greater than 4 hours for compliance of 83%. Average usage was 6 hours and 47 minutes. Residual AHI was 3.7 on 6 to 16 cm of water and an EPR of 3. He did have a leak noted in the 95th percentile of 36.8. He states that he is able to tighten his mask.   HISTORY: (copied from my note on 12/28/2018)  [name] [name] is a 60 y.o. male here today for follow up of OSA on CPAP and seizure like episodes (followed by Dr Terrace Arabia).   Compliance report dated 11/23/2021 12/23/2018 reveals that he is using CPAP 30 out of the last 30 days for compliance 100%.  He is using CPAP greater than 4  hours 29 out of the last 30 days for compliance 97%.  AHI was 2.9 on 6 to 16 cm of water and EPR of 3.  There was a mild leak noted in the 95th percentile of 24.2. He is in need of new supplies.   He continues to have 2-3 seizure like events per week with confusion, dizziness, anger, headaches and memory loss. He is taking Lamictal 150mg  twice daily as prescribed. Dose was increased in 2019 but he does not feel this has helped. He has been on multiple antiepileptic medications in the past with no relief of seizure like events. He is seeing psychiatry for high stress levels, depression and potential conversion  disorder. He is seen regularly. He denies urinary incontinence, LOC or biting of tongue. He does not drive.    HISTORY: (copied from Dr Dohmeier's note on 02/18/2018)  HISTORY FROM:Patient here alone for CPAP compliance. 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 He has OSA and used CPAP for years . He has been suffering a right shoulder and scapula injury when his dog caused him to fall, surgery pending with Dr. Jerl Santos. He has been diagnosed with 9 kidney stones, these were removed surgically. He used a couple of days pain killers, was give #40 of percocet, but used sparingly.  He has a family history of addiction, brother and uncle affected.  The patient underwent a home sleep test on apnea link on 30 Oct 2017, goal was to replace his by that time 49-year-old machine. The home sleep test confirmed an AHI of 32, RDI of 34.2/h, 32 minutes of oxygen desaturation under 89%. The patient qualified with these results of course for new CPAP. It was an auto titration capable machine with a pressure window between 6 and 16 cmH2O and 3 cm EPR mask of his choice. He has used the machine compliantly for 93% of the last 30 days, 6 hours and 9 minutes of his average use of time, the residual AHI is 5.1 dated 02-15-18. Pressure at the 95th percentile is 12 cmH2O air leaks are mild and have actually improved over the last 14 days. He has tightened the headgear at that time he reports. Uses a FFM-   He remains depressed , sees a therapist.He remains excessively daytime sleepy ;his current Epworth sleepiness score is endorsed at 21/24 points, fatigue severity at 30/63 points. His residual AHI of 5.1/hcan hardly explain the degree of excessive daytime sleepiness -I wonder if it Crohn's , recovery from kidneysurgery,The severedepression or other metabolic or illness related factors.   Dr Zannie Cove interval visit :He had a past medical  history of diabetes, hypertension, Crohn's disease, on immunosuppressive treatment, He presented with seizure-like event, reported initial event was June 28 2014, he was found by his wife to difficulty to be awake and eyes closed, took a while for him to open his eyes, and become very confused, also angry, episodes last about 5-10 minutes, there was also witnessed leg extension, jerking movement, He had multiple recurrent confusion episodes later,MRI of the brain, MRA of the brain in April 2013 was normal,EEG showed intermittent left anterior and medial temporal lobe slowing  Interval history from 03/11/2017, I have the pleasure of seeing MrSpiveyonce a year for compliance visit and his CPAP compliance is excellent. He is used to machine 97% of the time over 4 hours 100% of the last 30 days, his average user time of 6 hours 27  minutes the machine is set at 10 cm pressure this 2 cm expiratory pressure relief. His residual AHI is 2.8 but he does have high air leaks. These however does not did not distort the apnea count. He also feels that without this CPAP he cannot sleep as well. His fatigue questionnaire was endorsed high at 47 points, his Epworth sleepiness score was endorsed at 21 points out of 24 possible points, also very high. He continues to feel depressed, and I was able to review Dr. Hiram Comber report which stated that the patient's major depression was interfering with some of the validity of cognitive testing. She felt that there was room for conversion disorder based on the patient's own feeling of worthlessness, hopelessness and sadness. Mr. Rondel Baton reports that he had a seizure last night in spite of being compliant with his seizure medications.   REVIEW OF SYSTEMS: Out of a complete 14 system review of symptoms, the patient complains only of the following symptoms, seizure, non epileptic spells, imbalance and all other reviewed systems are negative.   ALLERGIES: Allergies   Allergen Reactions  . Azithromycin Swelling    SWELLING REACTION UNSPECIFIED   . Beef-Derived Products Diarrhea    RED MEAT > UNSPECIFIED REACTION   . Milk-Related Compounds Diarrhea    DAIRY >UNSPECIFIED REACTION   . Penicillins Hives    Rash as child- tolerates Ancef/CHILDHOOD ALLERGY Has patient had a PCN reaction causing immediate rash, facial/tongue/throat swelling, SOB or lightheadedness with hypotension: YES Has patient had a PCN reaction causing severe rash involving mucus membranes or skin necrosis: Unknown Has patient had a PCN reaction that required hospitalization: Unknown Has patient had a PCN reaction occurring within the last 10 years:Unknown If all of the above answers are "NO", then may proceed with Cephalosporin [place]  . Sulfonamide Derivatives Hives  . Dilaudid [Hydromorphone Hcl] Nausea And Vomiting    HOME MEDICATIONS: Outpatient Medications Prior to Visit  Medication Sig Dispense Refill  . Ascorbic Acid (VITAMIN C PO) Take 1 tablet by mouth daily.    . Calcium Carbonate-Vitamin D (CALCIUM 600+D PO) Take 1 tablet by mouth 2 (two) times daily.    . cholecalciferol (VITAMIN D) 1000 units tablet Take 1,000 Units at bedtime by mouth.     . eletriptan (RELPAX) 20 MG tablet Take 1 tablet (20 mg total) by mouth as needed for migraine or headache. May repeat in 2 hours if headache persists or recurs. 10 tablet 5  . esomeprazole (NEXIUM) 40 MG capsule TAKE 1 CAPSULE TWICE DAILY (Patient taking differently: Take 40 mg by mouth 2 (two) times daily before a meal.) 180 capsule 3  . Eszopiclone 3 MG TABS Take 3 mg by mouth at bedtime. Take immediately before bedtime    . folic acid (FOLVITE) 1 MG tablet Take 2 tablets (2 mg total) by mouth daily. 180 tablet 3  . glucose blood (CHOICE DM FORA G20 TEST STRIPS) test strip Use as instructed 100 each 12  . Ibuprofen-diphenhydrAMINE Cit (ADVIL PM PO) Take 1 tablet by mouth at bedtime.     . Lactase (DAIRY RELIEF PO) Take 2 tablets by  mouth in the morning and at bedtime.    . lamoTRIgine (LAMICTAL) 150 MG tablet Take 1 tablet (150 mg total) by mouth 2 (two) times daily. 180 tablet 3  . latanoprost (XALATAN) 0.005 % ophthalmic solution Place 1 drop into both eyes at bedtime.     . mercaptopurine (PURINETHOL) 50 MG tablet TAKE 2 TABLETS DAILY ON AN EMPTY STOMACH  1 HOUR BEFOREOR 2 HOURS AFTER A MEAL    CAUTION: CHEMOTHERAPY. 60 tablet 2  . mesalamine (LIALDA) 1.2 g EC tablet TAKE 2 TABLETS (2.4GM)     DAILY WITH BREAKFAST. (Patient taking differently: Take 1.2 g by mouth in the morning and at bedtime.) 180 tablet 3  . methscopolamine (PAMINE FORTE) 5 MG tablet TAKE 1 TABLET TWICE A DAY. (Patient taking differently: Take 5 mg by mouth 2 (two) times daily.) 180 tablet 3  . ONE TOUCH ULTRA TEST test strip USE AS INSTRUCTED 100 each 2  . potassium chloride SA (KLOR-CON) 20 MEQ tablet Take 1 tablet (20 mEq total) by mouth 2 (two) times daily. 180 tablet 3  . Probiotic Product (PROBIOTIC PO) Take 1 tablet at bedtime by mouth.     . rifaximin (XIFAXAN) 550 MG TABS tablet Take 1 tablet (550 mg total) by mouth 2 (two) times daily. 180 tablet 3  . sertraline (ZOLOFT) 100 MG tablet Take 200 mg daily with breakfast by mouth.     . traZODone (DESYREL) 150 MG tablet Take 150 mg by mouth at bedtime.     Facility-Administered Medications Prior to Visit  Medication Dose Route Frequency Provider Last Rate Last Admin  . gadopentetate dimeglumine (MAGNEVIST) injection 20 mL  20 mL Intravenous Once PRN Levert Feinstein, MD        PAST MEDICAL HISTORY: Past Medical History:  Diagnosis Date  . Adenomatous colon polyp 10/1999  . Anxiety   . Arthritis    neck, yoga helps.  . At high risk for falls    due to Seizure disorder  . Cancer (HCC)    skin  . Cataract   . Complex partial seizure (HCC)    last seizure was 08-30-2018  . Crohn's disease of small and large intestines (HCC) 1999  . Depression   . Diabetes mellitus    no meds at this time 10-24-17   . Esophageal stricture   . External hemorrhoids   . GERD (gastroesophageal reflux disease)   . Glaucoma   . History of kidney stones    3 large stones still present  . Hyperlipemia   . Hypertension    resolved with weight loss   . Inguinal hernia, bilateral 12/2019  . Migraines   . MRSA (methicillin resistant Staphylococcus aureus) 10/2010   . Osteoporosis   . PONV (postoperative nausea and vomiting)   . PTSD (post-traumatic stress disorder)   . Renal cyst, acquired, left 07/08/2017  . Renal lesion 05/28/2017  . Seizures (HCC)    last seizure was yesterday per pt in [place]- has seizures at [place] 6 days a week   . Skin cancer    squamous cell multiple; [facility]; followed every 3 months.  . Sleep apnea    CPAP machine, uses nightly  . Staphylococcus aureus bacteremia with sepsis (HCC) 05/28/2017    PAST SURGICAL HISTORY: Past Surgical History:  Procedure Laterality Date  . CATARACT EXTRACTION Bilateral   . COLONOSCOPY    . ELBOW SURGERY  2012   elbow MRSA infection   . EYE SURGERY     cataracts removed, /w IOL  . INGUINAL HERNIA REPAIR Right 01/14/2020   Procedure: OPEN RIGHT HERNIA REPAIR INGUINAL WITH MESH;  Surgeon: Berna Bue, MD;  Location: [facility] ORS;  Service: General;  Laterality: Right;  . INSERTION OF MESH N/A 07/01/2016   Procedure: INSERTION OF MESH;  Surgeon: Avel Peace, MD;  Location[facility] OR;  Service: General;  Laterality: N/A;  . IR  URETERAL STENT LEFT NEW ACCESS W/O SEP NEPHROSTOMY CATH  02/02/2018  . NEPHROLITHOTOMY Left 02/02/2018   Procedure: NEPHROLITHOTOMY PERCUTANEOUS;  Surgeon: Ihor Gully, MD;  Location: [facility] ORS;  Service: Urology;  Laterality: Left;  . POLYPECTOMY    . SCALP LACERATION REPAIR Right 10/16/2017   From fall/staples  . SHOULDER ARTHROSCOPY Right 03/05/2018   Procedure: ARTHROSCOPY SHOULDER AND OPEN DISTAL CLAVICLE EXCISION;  Surgeon: Marcene Corning, MD;  Location: [facility] OR;  Service: Orthopedics;  Laterality: Right;  . SHOULDER SURGERY     . SINUS SURGERY WITH INSTATRAK    . TEE WITHOUT CARDIOVERSION N/A 05/09/2017   Procedure: TRANSESOPHAGEAL ECHOCARDIOGRAM (TEE);  Surgeon: Jake Bathe, MD;  Location: [facility] ENDOSCOPY;  Service: Cardiovascular;  Laterality: N/A;  . TRANSTHORACIC ECHOCARDIOGRAM  02/22/2011   EF 55-65%; increased pattern of LVH with mild conc hypertrophy, abnormal relaxation & increased filling pressure (grade 2 diastolic dysfunction); atrial septum thickened (lipomatous hypertrophy)  . UMBILICAL HERNIA REPAIR N/A 07/01/2016   Procedure: UMBILICAL HERNIA REPAIR WITH MESH;  Surgeon: Avel Peace, MD;  [facility] OR;  Service: General;  Laterality: N/A;  . UPPER GASTROINTESTINAL ENDOSCOPY    . VASECTOMY      FAMILY HISTORY: Family History  Problem Relation Age of Onset  . Colon polyps Father   . Stroke Father   . Hyperlipidemia Father   . Hypertension Father   . Heart disease Mother        CABG at age 64  . Hyperlipidemia Mother   . Hypertension Mother   . Stroke Paternal Grandmother   . Stroke Paternal Grandfather   . Other Child        tetrology of fallot (cornealia deland syndrome)  . Colon cancer Neg Hx   . Esophageal cancer Neg Hx   . Stomach cancer Neg Hx   . Rectal cancer Neg Hx     SOCIAL HISTORY: Social History   Socioeconomic History  . Marital status: Married    Spouse name: Clydie Braun  . Number of children: 2  . Years of education: college  . Highest education level: Not on file  Occupational History  . Occupation: disabled    Comment: seizure disorder with memory impairment  Tobacco Use  . Smoking status: Never Smoker  . Smokeless tobacco: Never Used  Vaping Use  . Vaping Use: Never used  Substance and Sexual Activity  . Alcohol use: No  . Drug use: No  . Sexual activity: Yes    Birth control/protection: Surgical    Comment: 1 partner in last 12 months  Other Topics Concern  . Not on file  Social History Narrative   Marital status: married x 32 years;       Lives:    lives at home with his family.      Children:  two children; no grandchildren.  Son is special needs at age 71yo; Cornelian Delaine with TOF.       Employment: unemployed; [employer] long term disability      Tobacco: none      Alcohol: none      Drugs: none      Exercise: unable to exercise due to frequent falls   Patient has a college education.   Patient is right-handed.   Patient drinks one cup of soda daily.   Social Determinants of Health   Financial Resource Strain: Not on file  Food Insecurity: Not on file  Transportation Needs: Not on file  Physical Activity: Not on file  Stress:  Not on file  Social Connections: Not on file  Intimate Partner Violence: Not on file    PHYSICAL EXAM  Vitals:   07/03/20 1308  BP: 120/76  Pulse: 88  Weight: 187 lb 6.4 oz (85 kg)  Height: 5\' 11"  (1.803 m)   Body mass index is 26.14 kg/m.  Generalized: Well developed, in no acute distress  Cardiology: normal rate and rhythm, no murmur noted Respiratory: clear to auscultation bilaterally  Neurological examination  Mentation: Alert oriented to time, place, history taking. Follows all commands speech and language fluent Cranial nerve II-XII: Pupils were equal round reactive to light. Extraocular movements were full, visual field were full on confrontational test. Facial sensation and strength were normal. Uvula tongue midline. Head turning and shoulder shrug  were normal and symmetric. Motor: The motor testing reveals 5 over 5 strength of all 4 extremities. Good symmetric motor tone is noted throughout.  Sensory: Sensory testing is intact to soft touch on all 4 extremities. No evidence of extinction is noted.   Gait and station: Gait is normal DTR: normal and symmetric bilaterally   DIAGNOSTIC DATA (LABS, IMAGING, TESTING) - I reviewed patient records, labs, notes, testing and imaging myself where available.  MMSE - Mini Mental State Exam 10/07/2017 04/02/2017 04/30/2016  Orientation  to time 2 2 3   Orientation to Place 4 5 3   Registration 3 3 3   Attention/ Calculation 2 5 3   Recall 3 1 2   Language- name 2 objects 2 2 2   Language- repeat 1 1 1   Language- follow 3 step command 2 3 3   Language- read & follow direction 1 1 1   Write a sentence 1 1 1   Copy design 0 1 1  Total score 21 25 23      Lab Results  Component Value Date   WBC 2.8 (L) 01/15/2020   HGB 9.8 (L) 01/15/2020   HCT 27.7 (L) 01/15/2020   MCV 110.8 (H) 01/15/2020   PLT 75 (L) 01/15/2020      Component Value Date/Time   NA 141 01/15/2020 0609   NA 142 01/11/2020 0924   K 3.3 (L) 01/15/2020 0609   CL 106 01/15/2020 0609   CO2 27 01/15/2020 0609   GLUCOSE 91 01/15/2020 0609   BUN 13 01/15/2020 0609   BUN 10 01/11/2020 0924   CREATININE 0.72 01/15/2020 0609   CREATININE 0.84 05/15/2016 1005   CALCIUM 8.7 (L) 01/15/2020 0609   PROT 6.1 (L) 01/15/2020 0609   PROT 5.9 (L) 01/11/2020 0924   ALBUMIN 3.9 01/15/2020 0609   ALBUMIN 4.2 01/11/2020 0924   AST 42 (H) 01/15/2020 0609   ALT 18 01/15/2020 0609   ALKPHOS 73 01/15/2020 0609   BILITOT 4.0 (H) 01/15/2020 0609   BILITOT 2.8 (H) 01/11/2020 0924   GFRNONAA >60 01/15/2020 0609   GFRNONAA >89 06/03/2015 0858   GFRAA >60 01/15/2020 0609   GFRAA >89 06/03/2015 0858   Lab Results  Component Value Date   CHOL 151 01/11/2020   HDL 77 01/11/2020   LDLCALC 62 01/11/2020   TRIG 61 01/11/2020   CHOLHDL 2.0 01/11/2020   Lab Results  Component Value Date   HGBA1C 4.5 01/11/2020   Lab Results  Component Value Date   VITAMINB12 1,062 07/12/2019   Lab Results  Component Value Date   TSH 1.680 01/11/2020       ASSESSMENT AND PLAN 60 y.o. year old male  has a past medical history of Adenomatous colon polyp (10/1999), Anxiety, Arthritis, At high  risk for falls, Cancer (HCC), Cataract, Complex partial seizure (HCC), Crohn's disease of small and large intestines (HCC) (1999), Depression, Diabetes mellitus, Esophageal stricture, External  hemorrhoids, GERD (gastroesophageal reflux disease), Glaucoma, History of kidney stones, Hyperlipemia, Hypertension, Inguinal hernia, bilateral (12/2019), Migraines, MRSA (methicillin resistant Staphylococcus aureus) (10/2010 ), Osteoporosis, PONV (postoperative nausea and vomiting), PTSD (post-traumatic stress disorder), Renal cyst, acquired, left (07/08/2017), Renal lesion (05/28/2017), Seizures (HCC), Skin cancer, Sleep apnea, and Staphylococcus aureus bacteremia with sepsis (HCC) (05/28/2017). here with     ICD-10-CM   1. Partial symptomatic epilepsy with complex partial seizures, not intractable, without status epilepticus (HCC)  G40.209   2. Nonepileptic episode (HCC)  R56.9   3. OSA on CPAP  G47.33    Z99.89      Vidyut is doing well. He continues lamotrigine as prescribed with no obvious adverse effects. He continues to have non epileptic events regularly. He is followed closely by psychiatry and psychology as well as PCP. No neuro deficit noted on exam. [facility] workup was unremarkable. No recent epilieptic events. He will continue lamotrigine 150mg  BID. Seizure precautions advised. Compliance report reveals excellent compliance. He will continue CPAP nightly and for greater than 4 hours each night. We will send an order for new supplies today. He will continue to work with PCP and other specialist as directed. He will follow up in 1 year, sooner if needed. He verbalizes understanding and agreement with this plan.   No orders of the defined types were placed in this encounter.    No orders of the defined types were placed in this encounter.        Shawnie Dapper, FNP-C 07/03/2020, 1:15 PM [facility] [address], [address] [place], [place] [address] [phone]

## 2020-06-29 NOTE — Patient Instructions (Signed)
Below is our plan:  We will continue lamotrigine 139m twice daily. Continue CPAP nightly and greater than 4 hours each night. Continue close follow up with PCP and specialists as directed.   Please make sure you are staying well hydrated. I recommend 50-60 ounces daily. Well balanced diet and regular exercise encouraged.   Follow up in 1 year   You may receive a survey regarding today's visit. I encourage you to leave honest feed back as I do use this information to improve patient care. Thank you for seeing me today!    Please continue using your CPAP regularly. While your insurance requires that you use CPAP at least 4 hours each night on 70% of the nights, I recommend, that you not skip any nights and use it throughout the night if you can. Getting used to CPAP and staying with the treatment long term does take time and patience and discipline. Untreated obstructive sleep apnea when it is moderate to severe can have an adverse impact on cardiovascular health and raise her risk for heart disease, arrhythmias, hypertension, congestive heart failure, stroke and diabetes. Untreated obstructive sleep apnea causes sleep disruption, nonrestorative sleep, and sleep deprivation. This can have an impact on your day to day functioning and cause daytime sleepiness and impairment of cognitive function, memory loss, mood disturbance, and problems focussing. Using CPAP regularly can improve these symptoms.   Sleep Apnea Sleep apnea affects breathing during sleep. It causes breathing to stop for a short time or to become shallow. It can also increase the risk of:  Heart attack.  Stroke.  Being very overweight (obese).  Diabetes.  Heart failure.  Irregular heartbeat. The goal of treatment is to help you breathe normally again. What are the causes? There are three kinds of sleep apnea:  Obstructive sleep apnea. This is caused by a blocked or collapsed airway.  Central sleep apnea. This happens  when the brain does not send the right signals to the muscles that control breathing.  Mixed sleep apnea. This is a combination of obstructive and central sleep apnea. The most common cause of this condition is a collapsed or blocked airway. This can happen if:  Your throat muscles are too relaxed.  Your tongue and tonsils are too large.  You are overweight.  Your airway is too small. What increases the risk?  Being overweight.  Smoking.  Having a small airway.  Being older.  Being male.  Drinking alcohol.  Taking medicines to calm yourself (sedatives or tranquilizers).  Having family members with the condition. What are the signs or symptoms?  Trouble staying asleep.  Being sleepy or tired during the day.  Getting angry a lot.  Loud snoring.  Headaches in the morning.  Not being able to focus your mind (concentrate).  Forgetting things.  Less interest in sex.  Mood swings.  Personality changes.  Feelings of sadness (depression).  Waking up a lot during the night to pee (urinate).  Dry mouth.  Sore throat. How is this diagnosed?  Your medical history.  A physical exam.  A test that is done when you are sleeping (sleep study). The test is most often done in a sleep lab but may also be done at home. How is this treated?   Sleeping on your side.  Using a medicine to get rid of mucus in your nose (decongestant).  Avoiding the use of alcohol, medicines to help you relax, or certain pain medicines (narcotics).  Losing weight, if needed.  Changing  your diet.  Not smoking.  Using a machine to open your airway while you sleep, such as: ? An oral appliance. This is a mouthpiece that shifts your lower jaw forward. ? A CPAP device. This device blows air through a mask when you breathe out (exhale). ? An EPAP device. This has valves that you put in each nostril. ? A BPAP device. This device blows air through a mask when you breathe in (inhale)  and breathe out.  Having surgery if other treatments do not work. It is important to get treatment for sleep apnea. Without treatment, it can lead to:  High blood pressure.  Coronary artery disease.  In men, not being able to have an erection (impotence).  Reduced thinking ability. Follow these instructions at home: Lifestyle  Make changes that your doctor recommends.  Eat a healthy diet.  Lose weight if needed.  Avoid alcohol, medicines to help you relax, and some pain medicines.  Do not use any products that contain nicotine or tobacco, such as cigarettes, e-cigarettes, and chewing tobacco. If you need help quitting, ask your doctor. General instructions  Take over-the-counter and prescription medicines only as told by your doctor.  If you were given a machine to use while you sleep, use it only as told by your doctor.  If you are having surgery, make sure to tell your doctor you have sleep apnea. You may need to bring your device with you.  Keep all follow-up visits as told by your doctor. This is important. Contact a doctor if:  The machine that you were given to use during sleep bothers you or does not seem to be working.  You do not get better.  You get worse. Get help right away if:  Your chest hurts.  You have trouble breathing in enough air.  You have an uncomfortable feeling in your back, arms, or stomach.  You have trouble talking.  One side of your body feels weak.  A part of your face is hanging down. These symptoms may be an emergency. Do not wait to see if the symptoms will go away. Get medical help right away. Call your local emergency services (911 in the [place] Do not drive yourself to the hospital. Summary  This condition affects breathing during sleep.  The most common cause is a collapsed or blocked airway.  The goal of treatment is to help you breathe normally while you sleep. This information is not intended to replace advice given  to you by your health care provider. Make sure you discuss any questions you have with your health care provider. Document Revised: 03/27/2018 Document Reviewed: 02/03/2018 Elsevier Patient Education  [place].   Seizure, Adult A seizure is a sudden burst of abnormal electrical activity in the brain. Seizures usually last from 30 seconds to 2 minutes. They can cause many different symptoms. Usually, seizures are not harmful unless they last a long time. What are the causes? Common causes of this condition include:  Fever or infection.  Conditions that affect the brain, such as: ? A brain abnormality that you were born with. ? A brain or head injury. ? Bleeding in the brain. ? A tumor. ? Stroke. ? Brain disorders such as autism or cerebral palsy.  Low blood sugar.  Conditions that are passed from parent to child (are inherited).  Problems with substances, such as: ? Having a reaction to a drug or a medicine. ? Suddenly stopping the use of a substance (withdrawal). In  some cases, the cause may not be known. A person who has repeated seizures over time without a clear cause has a condition called epilepsy. What increases the risk? You are more likely to get this condition if you have:  A family history of epilepsy.  Had a seizure in the past.  A brain disorder.  A history of head injury, lack of oxygen at birth, or strokes. What are the signs or symptoms? There are many types of seizures. The symptoms vary depending on the type of seizure you have. Examples of symptoms during a seizure include:  Shaking (convulsions).  Stiffness in the body.  Passing out (losing consciousness).  Head nodding.  Staring.  Not responding to sound or touch.  Loss of bladder control and bowel control. Some people have symptoms right before and right after a seizure happens. Symptoms before a seizure may include:  Fear.  Worry (anxiety).  Feeling like you may vomit  (nauseous).  Feeling like the room is spinning (vertigo).  Feeling like you saw or heard something before (dj vu).  Odd tastes or smells.  Changes in how you see. You may see flashing lights or spots. Symptoms after a seizure happens can include:  Confusion.  Sleepiness.  Headache.  Weakness on one side of the body. How is this treated? Most seizures will stop on their own in under 5 minutes. In these cases, no treatment is needed. Seizures that last longer than 5 minutes will usually need treatment. Treatment can include:  Medicines given through an IV tube.  Avoiding things that are known to cause your seizures. These can include medicines that you take for another condition.  Medicines to treat epilepsy.  Surgery to stop the seizures. This may be needed if medicines do not help. Follow these instructions at home: Medicines  Take over-the-counter and prescription medicines only as told by your doctor.  Do not eat or drink anything that may keep your medicine from working, such as alcohol. Activity  Do not do any activities that would be dangerous if you had another seizure, like driving or swimming. Wait until your doctor says it is safe for you to do them.  If you live in the [place] ask your local DMV (department of motor vehicles) when you can drive.  Get plenty of rest. Teaching others Teach friends and family what to do when you have a seizure. They should:  Lay you on the ground.  Protect your head and body.  Loosen any tight clothing around your neck.  Turn you on your side.  Not hold you down.  Not put anything into your mouth.  Know whether or not you need emergency care.  Stay with you until you are better.  General instructions  Contact your doctor each time you have a seizure.  Avoid anything that gives you seizures.  Keep a seizure diary. Write down: ? What you think caused each seizure. ? What you remember about each seizure.  Keep  all follow-up visits as told by your doctor. This is important. Contact a doctor if:  You have another seizure.  You have seizures more often.  There is any change in what happens during your seizures.  You keep having seizures with treatment.  You have symptoms of being sick or having an infection. Get help right away if:  You have a seizure that: ? Lasts longer than 5 minutes. ? Is different than seizures you had before. ? Makes it harder to breathe. ? Happens  after you hurt your head.  You have any of these symptoms after a seizure: ? Not being able to speak. ? Not being able to use a part of your body. ? Confusion. ? A bad headache.  You have two or more seizures in a row.  You do not wake up right after a seizure.  You get hurt during a seizure. These symptoms may be an emergency. Do not wait to see if the symptoms will go away. Get medical help right away. Call your local emergency services (911 in the [place] Do not drive yourself to the hospital. Summary  Seizures usually last from 30 seconds to 2 minutes. Usually, they are not harmful unless they last a long time.  Do not eat or drink anything that may keep your medicine from working, such as alcohol.  Teach friends and family what to do when you have a seizure.  Contact your doctor each time you have a seizure. This information is not intended to replace advice given to you by your health care provider. Make sure you discuss any questions you have with your health care provider. Document Revised: 08/28/2018 Document Reviewed: 08/28/2018 Elsevier Patient Education  [place].

## 2020-07-03 ENCOUNTER — Ambulatory Visit (INDEPENDENT_AMBULATORY_CARE_PROVIDER_SITE_OTHER): Payer: 59 | Admitting: Family Medicine

## 2020-07-03 ENCOUNTER — Encounter: Payer: Self-pay | Admitting: Family Medicine

## 2020-07-03 ENCOUNTER — Other Ambulatory Visit: Payer: Self-pay

## 2020-07-03 VITALS — BP 120/76 | HR 88 | Ht 71.0 in | Wt 187.4 lb

## 2020-07-03 DIAGNOSIS — Z9989 Dependence on other enabling machines and devices: Secondary | ICD-10-CM | POA: Diagnosis not present

## 2020-07-03 DIAGNOSIS — G40209 Localization-related (focal) (partial) symptomatic epilepsy and epileptic syndromes with complex partial seizures, not intractable, without status epilepticus: Secondary | ICD-10-CM | POA: Diagnosis not present

## 2020-07-03 DIAGNOSIS — R569 Unspecified convulsions: Secondary | ICD-10-CM | POA: Diagnosis not present

## 2020-07-03 DIAGNOSIS — G4733 Obstructive sleep apnea (adult) (pediatric): Secondary | ICD-10-CM | POA: Diagnosis not present

## 2020-07-04 ENCOUNTER — Other Ambulatory Visit: Payer: 59

## 2020-07-05 ENCOUNTER — Ambulatory Visit
Admission: RE | Admit: 2020-07-05 | Discharge: 2020-07-05 | Disposition: A | Discharge status: Home / Self Care | Payer: 59 | Source: Ambulatory Visit | Attending: Family Medicine | Admitting: Family Medicine

## 2020-07-05 DIAGNOSIS — K5730 Diverticulosis of large intestine without perforation or abscess without bleeding: Secondary | ICD-10-CM | POA: Diagnosis not present

## 2020-07-05 DIAGNOSIS — K7460 Unspecified cirrhosis of liver: Secondary | ICD-10-CM | POA: Diagnosis not present

## 2020-07-05 DIAGNOSIS — R319 Hematuria, unspecified: Secondary | ICD-10-CM

## 2020-07-05 DIAGNOSIS — N200 Calculus of kidney: Secondary | ICD-10-CM | POA: Diagnosis not present

## 2020-07-05 DIAGNOSIS — R310 Gross hematuria: Secondary | ICD-10-CM | POA: Diagnosis not present

## 2020-07-05 MED ORDER — IOPAMIDOL (ISOVUE-300) INJECTION 61%
125.0000 mL | Freq: Once | INTRAVENOUS | Status: AC | PRN
  Administered 2020-07-05: 125 mL via INTRAVENOUS

## 2020-07-08 ENCOUNTER — Other Ambulatory Visit: Payer: Self-pay | Admitting: Gastroenterology

## 2020-07-12 DIAGNOSIS — L821 Other seborrheic keratosis: Secondary | ICD-10-CM | POA: Diagnosis not present

## 2020-07-12 DIAGNOSIS — D0461 Carcinoma in situ of skin of right upper limb, including shoulder: Secondary | ICD-10-CM | POA: Diagnosis not present

## 2020-07-12 DIAGNOSIS — L814 Other melanin hyperpigmentation: Secondary | ICD-10-CM | POA: Diagnosis not present

## 2020-07-12 DIAGNOSIS — Z85828 Personal history of other malignant neoplasm of skin: Secondary | ICD-10-CM | POA: Diagnosis not present

## 2020-07-12 DIAGNOSIS — C44329 Squamous cell carcinoma of skin of other parts of face: Secondary | ICD-10-CM | POA: Diagnosis not present

## 2020-07-12 DIAGNOSIS — L570 Actinic keratosis: Secondary | ICD-10-CM | POA: Diagnosis not present

## 2020-07-12 DIAGNOSIS — C4442 Squamous cell carcinoma of skin of scalp and neck: Secondary | ICD-10-CM | POA: Diagnosis not present

## 2020-07-17 DIAGNOSIS — R1084 Generalized abdominal pain: Secondary | ICD-10-CM | POA: Diagnosis not present

## 2020-07-17 DIAGNOSIS — R310 Gross hematuria: Secondary | ICD-10-CM | POA: Diagnosis not present

## 2020-07-17 DIAGNOSIS — N202 Calculus of kidney with calculus of ureter: Secondary | ICD-10-CM | POA: Diagnosis not present

## 2020-07-31 ENCOUNTER — Encounter: Payer: Self-pay | Admitting: Family Medicine

## 2020-08-03 DIAGNOSIS — R310 Gross hematuria: Secondary | ICD-10-CM | POA: Diagnosis not present

## 2020-08-03 DIAGNOSIS — N202 Calculus of kidney with calculus of ureter: Secondary | ICD-10-CM | POA: Diagnosis not present

## 2020-08-10 DIAGNOSIS — C44329 Squamous cell carcinoma of skin of other parts of face: Secondary | ICD-10-CM | POA: Diagnosis not present

## 2020-08-10 DIAGNOSIS — C4442 Squamous cell carcinoma of skin of scalp and neck: Secondary | ICD-10-CM | POA: Diagnosis not present

## 2020-08-10 DIAGNOSIS — Z85828 Personal history of other malignant neoplasm of skin: Secondary | ICD-10-CM | POA: Diagnosis not present

## 2020-08-11 DIAGNOSIS — N202 Calculus of kidney with calculus of ureter: Secondary | ICD-10-CM | POA: Diagnosis not present

## 2020-08-15 DIAGNOSIS — F4312 Post-traumatic stress disorder, chronic: Secondary | ICD-10-CM | POA: Diagnosis not present

## 2020-08-15 DIAGNOSIS — F411 Generalized anxiety disorder: Secondary | ICD-10-CM | POA: Diagnosis not present

## 2020-08-15 DIAGNOSIS — F332 Major depressive disorder, recurrent severe without psychotic features: Secondary | ICD-10-CM | POA: Diagnosis not present

## 2020-08-17 DIAGNOSIS — Z4802 Encounter for removal of sutures: Secondary | ICD-10-CM | POA: Diagnosis not present

## 2020-08-17 DIAGNOSIS — D485 Neoplasm of uncertain behavior of skin: Secondary | ICD-10-CM | POA: Diagnosis not present

## 2020-08-17 DIAGNOSIS — C44229 Squamous cell carcinoma of skin of left ear and external auricular canal: Secondary | ICD-10-CM | POA: Diagnosis not present

## 2020-08-18 DIAGNOSIS — R310 Gross hematuria: Secondary | ICD-10-CM | POA: Diagnosis not present

## 2020-08-18 DIAGNOSIS — N200 Calculus of kidney: Secondary | ICD-10-CM | POA: Diagnosis not present

## 2020-08-23 DIAGNOSIS — Z85828 Personal history of other malignant neoplasm of skin: Secondary | ICD-10-CM | POA: Diagnosis not present

## 2020-08-23 DIAGNOSIS — C44229 Squamous cell carcinoma of skin of left ear and external auricular canal: Secondary | ICD-10-CM | POA: Diagnosis not present

## 2020-09-08 ENCOUNTER — Encounter (HOSPITAL_COMMUNITY): Payer: Self-pay | Admitting: Emergency Medicine

## 2020-09-08 ENCOUNTER — Emergency Department (HOSPITAL_COMMUNITY): Payer: 59

## 2020-09-08 ENCOUNTER — Emergency Department (HOSPITAL_COMMUNITY)
Admission: EM | Admit: 2020-09-08 | Discharge: 2020-09-08 | Disposition: A | Discharge status: Home / Self Care | Payer: 59 | Attending: Emergency Medicine | Admitting: Emergency Medicine

## 2020-09-08 ENCOUNTER — Other Ambulatory Visit: Payer: Self-pay

## 2020-09-08 DIAGNOSIS — Z23 Encounter for immunization: Secondary | ICD-10-CM | POA: Insufficient documentation

## 2020-09-08 DIAGNOSIS — S0101XA Laceration without foreign body of scalp, initial encounter: Secondary | ICD-10-CM | POA: Diagnosis not present

## 2020-09-08 DIAGNOSIS — Z85828 Personal history of other malignant neoplasm of skin: Secondary | ICD-10-CM | POA: Insufficient documentation

## 2020-09-08 DIAGNOSIS — S0990XA Unspecified injury of head, initial encounter: Secondary | ICD-10-CM | POA: Diagnosis present

## 2020-09-08 DIAGNOSIS — M546 Pain in thoracic spine: Secondary | ICD-10-CM | POA: Diagnosis not present

## 2020-09-08 DIAGNOSIS — E119 Type 2 diabetes mellitus without complications: Secondary | ICD-10-CM | POA: Insufficient documentation

## 2020-09-08 DIAGNOSIS — Z79899 Other long term (current) drug therapy: Secondary | ICD-10-CM | POA: Insufficient documentation

## 2020-09-08 DIAGNOSIS — R0789 Other chest pain: Secondary | ICD-10-CM | POA: Diagnosis not present

## 2020-09-08 DIAGNOSIS — W01198A Fall on same level from slipping, tripping and stumbling with subsequent striking against other object, initial encounter: Secondary | ICD-10-CM | POA: Insufficient documentation

## 2020-09-08 DIAGNOSIS — I10 Essential (primary) hypertension: Secondary | ICD-10-CM | POA: Insufficient documentation

## 2020-09-08 DIAGNOSIS — Y9301 Activity, walking, marching and hiking: Secondary | ICD-10-CM | POA: Diagnosis not present

## 2020-09-08 LAB — CBC WITH DIFFERENTIAL/PLATELET
Abs Immature Granulocytes: 0.02 10*3/uL (ref 0.00–0.07)
Basophils Absolute: 0 10*3/uL (ref 0.0–0.1)
Basophils Relative: 1 %
Eosinophils Absolute: 0.2 10*3/uL (ref 0.0–0.5)
Eosinophils Relative: 4 %
HCT: 33.3 % — ABNORMAL LOW (ref 39.0–52.0)
Hemoglobin: 11.7 g/dL — ABNORMAL LOW (ref 13.0–17.0)
Immature Granulocytes: 0 %
Lymphocytes Relative: 13 %
Lymphs Abs: 0.6 10*3/uL — ABNORMAL LOW (ref 0.7–4.0)
MCH: 38.7 pg — ABNORMAL HIGH (ref 26.0–34.0)
MCHC: 35.1 g/dL (ref 30.0–36.0)
MCV: 110.3 fL — ABNORMAL HIGH (ref 80.0–100.0)
Monocytes Absolute: 0.3 10*3/uL (ref 0.1–1.0)
Monocytes Relative: 6 %
Neutro Abs: 3.7 10*3/uL (ref 1.7–7.7)
Neutrophils Relative %: 76 %
Platelets: 68 10*3/uL — ABNORMAL LOW (ref 150–400)
RBC: 3.02 MIL/uL — ABNORMAL LOW (ref 4.22–5.81)
RDW: 15.5 % (ref 11.5–15.5)
WBC: 4.9 10*3/uL (ref 4.0–10.5)
nRBC: 0 % (ref 0.0–0.2)

## 2020-09-08 LAB — COMPREHENSIVE METABOLIC PANEL
ALT: 24 U/L (ref 0–44)
AST: 35 U/L (ref 15–41)
Albumin: 4.4 g/dL (ref 3.5–5.0)
Alkaline Phosphatase: 112 U/L (ref 38–126)
Anion gap: 10 (ref 5–15)
BUN: 13 mg/dL (ref 6–20)
CO2: 24 mmol/L (ref 22–32)
Calcium: 9.1 mg/dL (ref 8.9–10.3)
Chloride: 106 mmol/L (ref 98–111)
Creatinine, Ser: 0.9 mg/dL (ref 0.61–1.24)
GFR, Estimated: >60 mL/min (ref >60)
Glucose, Bld: 77 mg/dL (ref 70–99)
Potassium: 3.7 mmol/L (ref 3.5–5.1)
Sodium: 140 mmol/L (ref 135–145)
Total Bilirubin: 3.1 mg/dL — ABNORMAL HIGH (ref 0.3–1.2)
Total Protein: 6.8 g/dL (ref 6.5–8.1)

## 2020-09-08 LAB — CBG MONITORING, ED
Glucose-Capillary: 88 mg/dL (ref 70–99)
Glucose-Capillary: 79 mg/dL (ref 70–99)

## 2020-09-08 MED ORDER — OXYCODONE-ACETAMINOPHEN 5-325 MG PO TABS
1.0000 | ORAL_TABLET | Freq: Once | ORAL | Status: AC
  Administered 2020-09-08: 1 via ORAL
  Filled 2020-09-08: qty 1

## 2020-09-08 MED ORDER — LIDOCAINE 5 % EX PTCH: 1.0000 | MEDICATED_PATCH | CUTANEOUS | Status: DC

## 2020-09-08 MED ORDER — TETANUS-DIPHTH-ACELL PERTUSSIS 5-2.5-18.5 LF-MCG/0.5 IM SUSY
0.5000 mL | PREFILLED_SYRINGE | Freq: Once | INTRAMUSCULAR | Status: AC
  Administered 2020-09-08: 0.5 mL via INTRAMUSCULAR
  Filled 2020-09-08: qty 0.5

## 2020-09-08 MED ORDER — LIDOCAINE 5 % EX PTCH
1.0000 | MEDICATED_PATCH | CUTANEOUS | Status: DC
  Filled 2020-09-08: qty 1

## 2020-09-08 MED ORDER — LIDOCAINE 5 % EX PTCH
1.0000 | MEDICATED_PATCH | CUTANEOUS | Status: DC
  Administered 2020-09-08: 1 via TRANSDERMAL
  Filled 2020-09-08: qty 1

## 2020-09-08 NOTE — ED Notes (Signed)
Patient waiting for wife for d/c

## 2020-09-08 NOTE — Discharge Instructions (Addendum)
You came to the emergency department today to be evaluated for your syncopal episode and fall.  CT scan of your head and neck showed no acute abnormalities.  Your lab work was reassuring.  You received an update of your tetanus shot.  Your scalp laceration did not require any sutures or stable.  Today you received medications that may make you sleepy or impair your ability to make decisions.  For the next 24 hours please do not drive, operate heavy machinery, care for a small child with out another adult present, or perform any activities that may cause harm to you or someone else if you were to fall asleep or be impaired.   You are being prescribed a medication which may make you sleepy. Please follow up of listed precautions for at least 24 hours after taking one dose.  Please take Ibuprofen (Advil, motrin) and Tylenol (acetaminophen) to relieve your pain.    You may take up to 600 MG (3 pills) of normal strength ibuprofen every 8 hours as needed.   You make take tylenol, up to 1,000 mg (two extra strength pills) every 8 hours as needed.   It is safe to take ibuprofen and tylenol at the same time as they work differently.   Do not take more than 3,000 mg tylenol in a 24 hour period (not more than one dose every 8 hours.  Please check all medication labels as many medications such as pain and cold medications may contain tylenol.  Do not drink alcohol while taking these medications.  Do not take other NSAID'S while taking ibuprofen (such as aleve or naproxen).  Please take ibuprofen with food to decrease stomach upset.  Get help right away if: You develop new bowel or bladder control problems. You have unusual weakness or numbness in your arms or legs. You develop nausea or vomiting. You develop abdominal pain. You feel faint.

## 2020-09-08 NOTE — ED Triage Notes (Signed)
Patient fell backwards around 4pm, endorses balance issues and hx of seizures, hit his posterior skull, blood and hematoma noted. States he does not remember 1 min before or 1 min after.

## 2020-09-09 NOTE — ED Provider Notes (Signed)
[facility] DEPT Provider Note   CSN: [id number] Arrival date & time: 09/08/20  1635     History Chief Complaint  Patient presents with  . Fall  . Head Laceration    [name] [name] is a 60 y.o. male with a history of complex partial seizures, hypertension, hyperlipidemia.  Patient presents with chief complaint of head, neck, and back pain after suffering a fall.  Patient reports that he was walking in his neighborhood when he had a loss of consciousness and fell.  Patient reports that he woke on the ground.  Patient reports does not recall approximately 5 minutes before his fall and approximately 1 to 2 minutes after his fall.  Patient states that bystanders assisted him after his fall.  Patient states that he walks with a cane at baseline.  Patient rates his pain a 6/10 on the pain scale.  Patient reports pain is constant.  Patient denies any radiation of pain.  Patient reports pain is worse with movement or touch.  Patient denies any alleviating factors.  Patient indicates pain is to the parietal region of his head where he has a hematoma.  Patient reports bilateral cervical neck and thoracic back pain.  Patient denies taking any blood thinners.  Patient denies any numbness or tingling to extremities, weakness to extremities, saddle anesthesia, bowel or bladder dysfunction, nausea or vomiting, visual disturbance, facial asymmetry, slurred speech, dizziness.    Patient reports that he has a history of complex partial seizures.  Patient takes Lamictal for his seizures.  Patient denies missing any doses.   HPI     Past Medical History:  Diagnosis Date  . Adenomatous colon polyp 10/1999  . Anxiety   . Arthritis    neck, yoga helps.  . At high risk for falls    due to Seizure disorder  . Cancer ([place])    skin  . Cataract   . Complex partial seizure ([place])    last seizure was 08-30-2018  . Crohn's disease of small and large intestines ([place]) 1999  .  Depression   . Diabetes mellitus    no meds at this time 10-24-17  . Esophageal stricture   . External hemorrhoids   . GERD (gastroesophageal reflux disease)   . Glaucoma   . History of kidney stones    3 large stones still present  . Hyperlipemia   . Hypertension    resolved with weight loss   . Inguinal hernia, bilateral 12/2019  . Migraines   . MRSA (methicillin resistant Staphylococcus aureus) 10/2010   . Osteoporosis   . PONV (postoperative nausea and vomiting)   . PTSD (post-traumatic stress disorder)   . Renal cyst, acquired, left 07/08/2017  . Renal lesion 05/28/2017  . Seizures ([place])    last seizure was yesterday per pt in [place]- has seizures at [place] 6 days a week   . Skin cancer    squamous cell multiple; [facility]; followed every 3 months.  . Sleep apnea    CPAP machine, uses nightly  . Staphylococcus aureus bacteremia with sepsis ([place]) 05/28/2017    Patient Active Problem List   Diagnosis Date Noted  . S/P inguinal hernia repair 01/14/2020  . At high risk for falls   . S/P shoulder surgery 03/05/2018  . Excessive daytime sleepiness 02/18/2018  . Crohn's disease of colon with rectal bleeding ([place]) 02/18/2018  . Blood in stool 02/18/2018  . Iron deficiency anemia due to chronic blood loss 02/18/2018  . Iron  deficiency anemia due to sideropenic dysphagia 02/18/2018  . Renal calculus, left 02/02/2018  . Fatigue 12/16/2017  . History of sepsis 12/16/2017  . Therapeutic drug monitoring 10/07/2017  . Renal cyst, acquired, left 07/08/2017  . Frequent falls 07/08/2017  . Staphylococcus aureus bacteremia with sepsis ([place]) 05/28/2017  . Renal lesion 05/28/2017  . Bacteremia due to Staphylococcus aureus 05/06/2017  . MSSA bacteremia 05/06/2017  . Confusion 04/02/2017  . Syncope 08/30/2016  . Depression 04/30/2016  . Memory loss 04/30/2016  . OSA on CPAP 10/31/2015  . Nonepileptic episode ([place]) 04/25/2015  . Seizure ([place]) 04/04/2013  . Abnormal EKG 03/12/2013  . DOE  (dyspnea on exertion) 03/12/2013  . Complex partial seizure ([place]) 04/19/2012  . DM2 (diabetes mellitus, type 2) ([place]) 09/25/2011  . Altered mental status 09/25/2011  . OTHER DYSPHAGIA 03/27/2009  . TRANSAMINASES, SERUM, ELEVATED 03/27/2009  . Cough 11/10/2008  . GERD 12/07/2007  . COLONIC POLYPS, ADENOMATOUS, HX OF 12/07/2007  . EXTERNAL HEMORRHOIDS 09/11/2007  . CROHN'S DISEASE, LARGE AND SMALL INTESTINES 09/11/2007  . OSTEOPOROSIS 09/11/2007  . HYPERLIPIDEMIA NEC/NOS 02/27/2007  . Essential hypertension 02/27/2007    Past Surgical History:  Procedure Laterality Date  . CATARACT EXTRACTION Bilateral   . COLONOSCOPY    . ELBOW SURGERY  2012   elbow MRSA infection   . EYE SURGERY     cataracts removed, /w IOL  . INGUINAL HERNIA REPAIR Right 01/14/2020   Procedure: OPEN RIGHT HERNIA REPAIR INGUINAL WITH MESH;  Surgeon: [name], MD;  Location: [facility];  Service: General;  Laterality: Right;  . INSERTION OF MESH N/A 07/01/2016   Procedure: INSERTION OF MESH;  Surgeon: [name], MD;  Location: [facility];  Service: General;  Laterality: N/A;  . IR URETERAL STENT LEFT NEW ACCESS W/O SEP NEPHROSTOMY CATH  02/02/2018  . NEPHROLITHOTOMY Left 02/02/2018   Procedure: NEPHROLITHOTOMY PERCUTANEOUS;  Surgeon: [name], MD;  Location: [facility];  Service: Urology;  Laterality: Left;  . POLYPECTOMY    . SCALP LACERATION REPAIR Right 10/16/2017   From fall/staples  . SHOULDER ARTHROSCOPY Right 03/05/2018   Procedure: ARTHROSCOPY SHOULDER AND OPEN DISTAL CLAVICLE EXCISION;  Surgeon: [name], MD;  Location: [facility];  Service: Orthopedics;  Laterality: Right;  . SHOULDER SURGERY    . SINUS SURGERY WITH INSTATRAK    . TEE WITHOUT CARDIOVERSION N/A 05/09/2017   Procedure: TRANSESOPHAGEAL ECHOCARDIOGRAM (TEE);  Surgeon: [name], MD;  Location: [facility] ENDOSCOPY;  Service: Cardiovascular;  Laterality: N/A;  . TRANSTHORACIC ECHOCARDIOGRAM  02/22/2011   EF 55-65%; increased pattern of  LVH with mild conc hypertrophy, abnormal relaxation & increased filling pressure (grade 2 diastolic dysfunction); atrial septum thickened (lipomatous hypertrophy)  . UMBILICAL HERNIA REPAIR N/A 07/01/2016   Procedure: UMBILICAL HERNIA REPAIR WITH MESH;  Surgeon: [name], MD;  Location: [facility];  Service: General;  Laterality: N/A;  . UPPER GASTROINTESTINAL ENDOSCOPY    . VASECTOMY         Family History  Problem Relation Age of Onset  . Colon polyps Father   . Stroke Father   . Hyperlipidemia Father   . Hypertension Father   . Heart disease Mother        CABG at age 62  . Hyperlipidemia Mother   . Hypertension Mother   . Stroke Paternal Grandmother   . Stroke Paternal Grandfather   . Other Child        tetrology of fallot (cornealia deland syndrome)  . Colon cancer Neg Hx   .  Esophageal cancer Neg Hx   . Stomach cancer Neg Hx   . Rectal cancer Neg Hx     Social History   Tobacco Use  . Smoking status: Never Smoker  . Smokeless tobacco: Never Used  Vaping Use  . Vaping Use: Never used  Substance Use Topics  . Alcohol use: No  . Drug use: No    Home Medications Prior to Admission medications   Medication Sig Start Date End Date Taking? Authorizing Provider  Ascorbic Acid (VITAMIN C PO) Take 1 tablet by mouth daily.    [provider]  Calcium Carbonate-Vitamin D (CALCIUM 600+D PO) Take 1 tablet by mouth 2 (two) times daily.    [provider]  cholecalciferol (VITAMIN D) 1000 units tablet Take 1,000 Units at bedtime by mouth.     [provider]  eletriptan (RELPAX) 20 MG tablet Take 1 tablet (20 mg total) by mouth as needed for migraine or headache. May repeat in 2 hours if headache persists or recurs. 07/12/19   Daleen Squibb, MD  esomeprazole (NEXIUM) 40 MG capsule TAKE 1 CAPSULE TWICE DAILY 07/11/20   Ladene Artist, MD  Eszopiclone 3 MG TABS Take 3 mg by mouth at bedtime. Take immediately before bedtime    [provider]  folic acid (FOLVITE) 1 MG tablet Take 2 tablets (2 mg total) by mouth daily. 01/11/20   Jacelyn Pi, Lilia Argue, MD  glucose blood (CHOICE DM FORA G20 TEST STRIPS) test strip Use as instructed 11/13/12   [name], MD  Ibuprofen-diphenhydrAMINE Cit (ADVIL PM PO) Take 1 tablet by mouth at bedtime.     [provider]  Lactase (DAIRY RELIEF PO) Take 2 tablets by mouth in the morning and at bedtime.    [provider]  lamoTRIgine (LAMICTAL) 150 MG tablet Take 1 tablet (150 mg total) by mouth 2 (two) times daily. 07/01/19   Lomax, Amy, NP  latanoprost (XALATAN) 0.005 % ophthalmic solution Place 1 drop into both eyes at bedtime.     [provider]  mercaptopurine (PURINETHOL) 50 MG tablet TAKE 2 TABLETS DAILY ON AN EMPTY STOMACH 1 HOUR BEFOREOR 2 HOURS AFTER A MEAL    CAUTION: CHEMOTHERAPY. 07/11/20   Ladene Artist, MD  mesalamine (LIALDA) 1.2 g EC tablet TAKE 2 TABLETS DAILY WITH  BREAKFAST, schedule appt for March for further refills 07/11/20   Ladene Artist, MD  methscopolamine (PAMINE FORTE) 5 MG tablet TAKE 1 TABLET TWICE A DAY 07/11/20   Ladene Artist, MD  ONE TOUCH ULTRA TEST test strip USE AS INSTRUCTED    Weber, Damaris Hippo, PA-C  potassium chloride SA (KLOR-CON) 20 MEQ tablet Take 1 tablet (20 mEq total) by mouth 2 (two) times daily. 01/11/20   Daleen Squibb, MD  Probiotic Product (PROBIOTIC PO) Take 1 tablet at bedtime by mouth.     [provider]  sertraline (ZOLOFT) 100 MG tablet Take 200 mg daily with breakfast by mouth.  03/16/17   [provider]  traZODone (DESYREL) 150 MG tablet Take 150 mg by mouth at bedtime.    [provider]  XIFAXAN 550 MG TABS tablet TAKE 1 TABLET TWICE A DAY 07/11/20   Ladene Artist, MD    Allergies    Azithromycin, Beef-derived products, Milk-related compounds, Penicillins, Sulfonamide derivatives, and Dilaudid [hydromorphone hcl]  Review of Systems   Review of Systems   Constitutional: Negative for chills and fever.  Eyes: Negative for  visual disturbance.  Respiratory: Negative for shortness of breath.   Cardiovascular: Negative for chest pain.  Gastrointestinal: Negative for abdominal pain, nausea and vomiting.  Genitourinary: Negative for difficulty urinating and dysuria.  Musculoskeletal: Positive for back pain, gait problem (unsteady gait a baseline ) and neck pain.  Skin: Negative for color change and rash.  Neurological: Positive for syncope. Negative for dizziness, tremors, facial asymmetry, speech difficulty, weakness, light-headedness, numbness and headaches.  Psychiatric/Behavioral: Negative for confusion.    Physical Exam Updated Vital Signs BP 133/75 (BP Location: Left Arm)   Pulse 78   Temp 99 F (37.2 C) (Oral)   Resp 18   Ht 5' 11"  (1.803 m)   Wt 85 kg   SpO2 96%   BMI 26.14 kg/m   Physical Exam Vitals and nursing note reviewed.  Constitutional:      General: He is not in acute distress.    Appearance: He is not ill-appearing, toxic-appearing or diaphoretic.  HENT:     Head: Normocephalic. Abrasion present. No raccoon eyes, Battle's sign, contusion, masses, right periorbital erythema, left periorbital erythema or laceration.     Jaw: No trismus or pain on movement.      Comments: Superficial abrasion and hematoma to parietal region of scalp    Mouth/Throat:     Pharynx: Uvula midline.  Eyes:     General: No scleral icterus.       Right eye: No discharge.        Left eye: No discharge.     Extraocular Movements: Extraocular movements intact.     Pupils: Pupils are equal, round, and reactive to light.  Cardiovascular:     Rate and Rhythm: Normal rate.  Pulmonary:     Effort: Pulmonary effort is normal. No respiratory distress.     Breath sounds: No stridor.  Chest:     Chest wall: Tenderness present. No mass, lacerations, deformity, swelling, crepitus or edema.     Comments: Tenderness to right chest wall Abdominal:      Palpations: Abdomen is soft.     Tenderness: There is no abdominal tenderness.  Musculoskeletal:     Cervical back: Neck supple. Tenderness present. No swelling, edema, deformity, erythema, signs of trauma, lacerations, rigidity, spasms, torticollis, bony tenderness or crepitus.     Thoracic back: Edema and tenderness present. No swelling, deformity, signs of trauma, lacerations, spasms or bony tenderness.     Lumbar back: No swelling, edema, deformity, signs of trauma, lacerations, spasms, tenderness or bony tenderness.     Right lower leg: No edema.     Left lower leg: No edema.     Comments: Bilateral paraspinous tenderness to cervical neck and upper region of thoracic back  No midline tenderness, step-off, or deformity noted to cervical, thoracic, or lumbar spine.  No tenderness or bony tenderness to bilateral lower and upper extremities  Skin:    General: Skin is warm and dry.     Coloration: Skin is not jaundiced or pale.  Neurological:     General: No focal deficit present.     Mental Status: He is alert and oriented to person, place, and time.     GCS: GCS eye subscore is 4. GCS verbal subscore is 5. GCS motor subscore is 6.     Cranial Nerves: No cranial nerve deficit or facial asymmetry.     Sensory: Sensation is intact.     Motor: No weakness, tremor, seizure activity or pronator drift.     Coordination: Finger-Nose-Finger Test  normal. Impaired rapid alternating movements.     Comments: CN II-XII intact; performed in supine position, +5 strength to bilateral upper extremities, +5 strength to dorsiflexion and plantarflexion, patient able to left both legs against gravity and hold each there without difficulty   Psychiatric:        Behavior: Behavior is cooperative.     ED Results / Procedures / Treatments   Labs (all labs ordered are listed, but only abnormal results are displayed) Labs Reviewed  COMPREHENSIVE METABOLIC PANEL - Abnormal; Notable for the following  components:      Result Value   Total Bilirubin 3.1 (*)    All other components within normal limits  CBC WITH DIFFERENTIAL/PLATELET - Abnormal; Notable for the following components:   RBC 3.02 (*)    Hemoglobin 11.7 (*)    HCT 33.3 (*)    MCV 110.3 (*)    MCH 38.7 (*)    Platelets 68 (*)    Lymphs Abs 0.6 (*)    All other components within normal limits  CBG MONITORING, ED  CBG MONITORING, ED    EKG None  Radiology DG Ribs Unilateral W/Chest Right  Result Date: 09/08/2020 CLINICAL DATA:  Pain after fall. EXAM: RIGHT RIBS AND CHEST - 3+ VIEW COMPARISON:  July 29, 2017 FINDINGS: The heart, hila, and mediastinum are unremarkable. No pneumothorax. There is a fracture through the right posterior ninth rib. Possible subtle fracture through the right posterior eighth rib. Possible fracture through the right posterior fifth rib. No other acute abnormalities. IMPRESSION: There is a definitive fracture through the right posterior ninth rib with possible fractures of the right posterior fifth and eighth ribs. No pneumothorax. Electronically Signed   By: Dorise Bullion III M.D   On: 09/08/2020 19:50   CT Head Wo Contrast  Result Date: 09/08/2020 CLINICAL DATA:  Head trauma, mod-severe Head and neck pain after fall. EXAM: CT HEAD WITHOUT CONTRAST TECHNIQUE: Contiguous axial images were obtained from the base of the skull through the vertex without intravenous contrast. COMPARISON:  Head CT 10/16/2017 FINDINGS: Brain: No evidence of acute infarction, hemorrhage, hydrocephalus, extra-axial collection or mass lesion/mass effect. Similar degree of generalized atrophy, advanced for age. Vascular: No hyperdense vessel. Skull: No fracture or focal lesion. Sinuses/Orbits: No acute findings.  Bilateral cataract resection. Other: None. IMPRESSION: 1. No acute intracranial abnormality. No skull fracture. 2. Similar degree of generalized atrophy since 2019. Electronically Signed   By: Keith Rake M.D.    On: 09/08/2020 19:48   CT Cervical Spine Wo Contrast  Result Date: 09/08/2020 CLINICAL DATA:  Head and neck pain after fall. Neck trauma, midline tenderness (Age 29-64y) EXAM: CT CERVICAL SPINE WITHOUT CONTRAST TECHNIQUE: Multidetector CT imaging of the cervical spine was performed without intravenous contrast. Multiplanar CT image reconstructions were also generated. COMPARISON:  Thoracic spine CT 11/26/2017, cervical spine CT 10/17/2017 FINDINGS: Alignment: Normal. Skull base and vertebrae: No acute fracture. Remote T3 superior endplate compression fracture is seen on prior thoracic spine CT. The dens and skull base are intact. Soft tissues and spinal canal: No prevertebral fluid or swelling. No visible canal hematoma. Disc levels: Bulky anterior osteophytes throughout the cervical spine. No significant disc space narrowing. Mild degenerative changes C1-C2. Facet hypertrophy at C3-C4 and C4-C5 stable from prior. Upper chest: No acute findings.  Upper esophagus is mildly patulous. Other: None. IMPRESSION: 1. No acute fracture or subluxation of the cervical spine. 2. Remote T3 superior endplate compression fracture. Electronically Signed   By: Keith Rake  M.D.   On: 09/08/2020 19:45    Procedures Procedures   Medications Ordered in ED Medications  lidocaine (LIDODERM) 5 % 1 patch (1 patch Transdermal Patch Applied 09/08/20 2212)  Tdap (BOOSTRIX) injection 0.5 mL (0.5 mLs Intramuscular Given 09/08/20 2211)  oxyCODONE-acetaminophen (PERCOCET/ROXICET) 5-325 MG per tablet 1 tablet (1 tablet Oral Given 09/08/20 2212)    ED Course  I have reviewed the triage vital signs and the nursing notes.  Pertinent labs & imaging results that were available during my care of the patient were reviewed by me and considered in my medical decision making (see chart for details).    MDM Rules/Calculators/A&P                          Alert and oriented 60 year old male no acute distress, nontoxic appearing.   Patient presents after syncopal episode and fall.  Patient complains of pain to parietal head, bilateral cervical neck and bilateral upper thoracic back.  Patient has a history of complex partial seizures and reports he has not missed any doses of his Lamictal.  Patient does not take any blood thinners.  On physical exam patient has no neurological deficits.  No midline tenderness, step-off, or deformity to cervical, thoracic, or lumbar spine.  No tenderness or bony tenderness to bilateral upper and lower extremities.  Patient noted to have superficial abrasion and hematoma to parietal scalp.  Tenderness to right chest wall.  We will obtain noncontrast head and cervical spine CT, right rib x-ray, EKG, CMP and CBC.  Patient is unsure when his last tetanus shot was; per chart review received in 04/2016.  Will update tetanus today.  Noncontrast head and cervical spine CT showed no acute intracranial abnormality, no skull fracture, no acute fracture or subluxation of the cervical spine.  Remote T3 superior endplate compression fracture.  Right rib x-ray shows definitive fractures to the right posterior ninth rib with possible fractures of the right posterior fifth and eighth ribs.  Patient reports that he has a known history of right rib fractures.  EKG shows normal sinus rhythm. CMP is unremarkable.  Low suspicion for cardiac nature or electrolyte nature as cause of syncopal episode.  CBC shows slight decrease in hemoglobin and hematocrit at 11.7 and 33.3 respectively.  Improved from previous lab work obtained 7 months prior which showed hemoglobin 9.8 and hematocrit 27.7.  Platelets noted to be decreased at 68 appears to be slightly lower than patient's baseline which range from 75-92 over the last 2 years.  Patient able to stand and ambulate without assistance.  Patient given Percocet pain medication and lidocaine patch to alleviate his symptoms.    Patient is amenable to discharge and follow-up  with his primary care provider.  Patient advised to use over-the-counter pain medication as needed for pain management as well as ice and/or heat.  Discussed results, findings, treatment and follow up. Patient advised of return precautions. Patient verbalized understanding and agreed with plan.    Final Clinical Impression(s) / ED Diagnoses Final diagnoses:  Laceration of scalp, initial encounter  Acute bilateral thoracic back pain    Rx / DC Orders ED Discharge Orders    None       Dyann Ruddle 09/09/20 0204    Breck Coons, MD 09/10/20 [phone]

## 2020-09-21 ENCOUNTER — Ambulatory Visit (INDEPENDENT_AMBULATORY_CARE_PROVIDER_SITE_OTHER): Payer: 59 | Admitting: Gastroenterology

## 2020-09-21 DIAGNOSIS — Z23 Encounter for immunization: Secondary | ICD-10-CM | POA: Diagnosis not present

## 2020-09-30 ENCOUNTER — Other Ambulatory Visit: Payer: Self-pay | Admitting: Gastroenterology

## 2020-11-09 ENCOUNTER — Telehealth: Payer: Self-pay | Admitting: Family Medicine

## 2020-11-09 DIAGNOSIS — R569 Unspecified convulsions: Secondary | ICD-10-CM

## 2020-11-09 MED ORDER — LAMOTRIGINE 150 MG PO TABS: 150.0000 mg | ORAL_TABLET | Freq: Two times a day (BID) | ORAL | 3 refills | Status: DC

## 2020-11-09 NOTE — Telephone Encounter (Signed)
Relayed to pt. He was appreciative.

## 2020-11-09 NOTE — Addendum Note (Signed)
Addended by: Brandon Melnick on: 11/09/2020 02:28 PM   Modules accepted: Orders

## 2020-11-09 NOTE — Telephone Encounter (Signed)
Pt is requesting a refill for lamoTRIgine (LAMICTAL) 150 MG tablet.  Pharmacy: [facility]

## 2020-11-23 ENCOUNTER — Emergency Department (HOSPITAL_BASED_OUTPATIENT_CLINIC_OR_DEPARTMENT_OTHER)
Admission: EM | Admit: 2020-11-23 | Discharge: 2020-11-23 | Disposition: A | Discharge status: Home / Self Care | Payer: 59 | Attending: Emergency Medicine | Admitting: Emergency Medicine

## 2020-11-23 ENCOUNTER — Other Ambulatory Visit: Payer: Self-pay

## 2020-11-23 ENCOUNTER — Emergency Department (HOSPITAL_BASED_OUTPATIENT_CLINIC_OR_DEPARTMENT_OTHER): Payer: 59

## 2020-11-23 ENCOUNTER — Encounter (HOSPITAL_BASED_OUTPATIENT_CLINIC_OR_DEPARTMENT_OTHER): Payer: Self-pay | Admitting: Emergency Medicine

## 2020-11-23 DIAGNOSIS — Z85828 Personal history of other malignant neoplasm of skin: Secondary | ICD-10-CM | POA: Diagnosis not present

## 2020-11-23 DIAGNOSIS — J029 Acute pharyngitis, unspecified: Secondary | ICD-10-CM | POA: Diagnosis present

## 2020-11-23 DIAGNOSIS — I10 Essential (primary) hypertension: Secondary | ICD-10-CM | POA: Diagnosis not present

## 2020-11-23 DIAGNOSIS — J039 Acute tonsillitis, unspecified: Secondary | ICD-10-CM | POA: Insufficient documentation

## 2020-11-23 DIAGNOSIS — D696 Thrombocytopenia, unspecified: Secondary | ICD-10-CM | POA: Insufficient documentation

## 2020-11-23 DIAGNOSIS — E119 Type 2 diabetes mellitus without complications: Secondary | ICD-10-CM | POA: Diagnosis not present

## 2020-11-23 DIAGNOSIS — D649 Anemia, unspecified: Secondary | ICD-10-CM | POA: Diagnosis not present

## 2020-11-23 LAB — CBC WITH DIFFERENTIAL/PLATELET
Abs Immature Granulocytes: 0.01 10*3/uL (ref 0.00–0.07)
Basophils Absolute: 0 10*3/uL (ref 0.0–0.1)
Basophils Relative: 1 %
Eosinophils Absolute: 0.2 10*3/uL (ref 0.0–0.5)
Eosinophils Relative: 5 %
HCT: 30.6 % — ABNORMAL LOW (ref 39.0–52.0)
Hemoglobin: 11 g/dL — ABNORMAL LOW (ref 13.0–17.0)
Immature Granulocytes: 0 %
Lymphocytes Relative: 11 %
Lymphs Abs: 0.4 10*3/uL — ABNORMAL LOW (ref 0.7–4.0)
MCH: 38.3 pg — ABNORMAL HIGH (ref 26.0–34.0)
MCHC: 35.9 g/dL (ref 30.0–36.0)
MCV: 106.6 fL — ABNORMAL HIGH (ref 80.0–100.0)
Monocytes Absolute: 0.3 10*3/uL (ref 0.1–1.0)
Monocytes Relative: 9 %
Neutro Abs: 2.8 10*3/uL (ref 1.7–7.7)
Neutrophils Relative %: 74 %
Platelets: 73 10*3/uL — ABNORMAL LOW (ref 150–400)
RBC: 2.87 MIL/uL — ABNORMAL LOW (ref 4.22–5.81)
RDW: 15.3 % (ref 11.5–15.5)
Smear Review: NORMAL
WBC: 3.8 10*3/uL — ABNORMAL LOW (ref 4.0–10.5)
nRBC: 0 % (ref 0.0–0.2)

## 2020-11-23 LAB — COMPREHENSIVE METABOLIC PANEL
ALT: 17 U/L (ref 0–44)
AST: 25 U/L (ref 15–41)
Albumin: 3.9 g/dL (ref 3.5–5.0)
Alkaline Phosphatase: 137 U/L — ABNORMAL HIGH (ref 38–126)
Anion gap: 8 (ref 5–15)
BUN: 9 mg/dL (ref 6–20)
CO2: 24 mmol/L (ref 22–32)
Calcium: 8.6 mg/dL — ABNORMAL LOW (ref 8.9–10.3)
Chloride: 110 mmol/L (ref 98–111)
Creatinine, Ser: 0.76 mg/dL (ref 0.61–1.24)
GFR, Estimated: >60 mL/min (ref >60)
Glucose, Bld: 121 mg/dL — ABNORMAL HIGH (ref 70–99)
Potassium: 3.4 mmol/L — ABNORMAL LOW (ref 3.5–5.1)
Sodium: 142 mmol/L (ref 135–145)
Total Bilirubin: 2.3 mg/dL — ABNORMAL HIGH (ref 0.3–1.2)
Total Protein: 6 g/dL — ABNORMAL LOW (ref 6.5–8.1)

## 2020-11-23 LAB — GROUP A STREP BY PCR: Group A Strep by PCR: NOT DETECTED

## 2020-11-23 MED ORDER — KETOROLAC TROMETHAMINE 30 MG/ML IJ SOLN
30.0000 mg | Freq: Once | INTRAMUSCULAR | Status: AC
  Administered 2020-11-23: 30 mg via INTRAVENOUS
  Filled 2020-11-23: qty 1

## 2020-11-23 MED ORDER — LIDOCAINE VISCOUS HCL 2 % MT SOLN: 15.0000 mL | OROMUCOSAL | 0 refills | Status: DC | PRN

## 2020-11-23 MED ORDER — CLINDAMYCIN HCL 300 MG PO CAPS: 300.0000 mg | ORAL_CAPSULE | Freq: Three times a day (TID) | ORAL | 0 refills | Status: AC

## 2020-11-23 NOTE — ED Notes (Signed)
Called patient in lobby.  Patient in restroom.

## 2020-11-23 NOTE — ED Triage Notes (Signed)
Pt states he has had difficulty swallowing x 3 days. He states his throat is sore but feels his lymph nodes are swollen. States it does not feel like a normal sore throat.

## 2020-11-23 NOTE — ED Provider Notes (Signed)
[facility] EMERGENCY DEPT Provider Note   CSN: [id number] Arrival date & time: 11/23/20  9735     History Chief Complaint  Patient presents with  . Dysphagia    [name] [name] is a 60 y.o. male.  States that he does not feel well overall.  Does have a history of GERD and has had endoscopy in the past.  No sick contacts.  The history is provided by the patient.  Sore Throat This is a new problem. Episode onset: 3 days. The problem occurs constantly. The problem has not changed since onset.Pertinent negatives include no chest pain, no abdominal pain, no headaches and no shortness of breath. The symptoms are aggravated by swallowing. Nothing relieves the symptoms. He has tried water for the symptoms. The treatment provided no relief.       Past Medical History:  Diagnosis Date  . Adenomatous colon polyp 10/1999  . Anxiety   . Arthritis    neck, yoga helps.  . At high risk for falls    due to Seizure disorder  . Cancer ([place])    skin  . Cataract   . Complex partial seizure ([place])    last seizure was 08-30-2018  . Crohn's disease of small and large intestines ([place]) 1999  . Depression   . Diabetes mellitus    no meds at this time 10-24-17  . Esophageal stricture   . External hemorrhoids   . GERD (gastroesophageal reflux disease)   . Glaucoma   . History of kidney stones    3 large stones still present  . Hyperlipemia   . Hypertension    resolved with weight loss   . Inguinal hernia, bilateral 12/2019  . Migraines   . MRSA (methicillin resistant Staphylococcus aureus) 10/2010   . Osteoporosis   . PONV (postoperative nausea and vomiting)   . PTSD (post-traumatic stress disorder)   . Renal cyst, acquired, left 07/08/2017  . Renal lesion 05/28/2017  . Seizures ([place])    last seizure was yesterday per pt in [place]- has seizures at [facility] 6 days a week   . Skin cancer    squamous cell multiple; [facility]; followed every 3 months.  . Sleep apnea    CPAP machine, uses  nightly  . Staphylococcus aureus bacteremia with sepsis ([place]) 05/28/2017    Patient Active Problem List   Diagnosis Date Noted  . S/P inguinal hernia repair 01/14/2020  . At high risk for falls   . S/P shoulder surgery 03/05/2018  . Excessive daytime sleepiness 02/18/2018  . Crohn's disease of colon with rectal bleeding ([place]) 02/18/2018  . Blood in stool 02/18/2018  . Iron deficiency anemia due to chronic blood loss 02/18/2018  . Iron deficiency anemia due to sideropenic dysphagia 02/18/2018  . Renal calculus, left 02/02/2018  . Fatigue 12/16/2017  . History of sepsis 12/16/2017  . Therapeutic drug monitoring 10/07/2017  . Renal cyst, acquired, left 07/08/2017  . Frequent falls 07/08/2017  . Staphylococcus aureus bacteremia with sepsis ([place]) 05/28/2017  . Renal lesion 05/28/2017  . Bacteremia due to Staphylococcus aureus 05/06/2017  . MSSA bacteremia 05/06/2017  . Confusion 04/02/2017  . Syncope 08/30/2016  . Depression 04/30/2016  . Memory loss 04/30/2016  . OSA on CPAP 10/31/2015  . Nonepileptic episode ([place]) 04/25/2015  . Seizure ([place]) 04/04/2013  . Abnormal EKG 03/12/2013  . DOE (dyspnea on exertion) 03/12/2013  . Complex partial seizure ([place]) 04/19/2012  . DM2 (diabetes mellitus, type 2) ([place]) 09/25/2011  . Altered mental status  09/25/2011  . OTHER DYSPHAGIA 03/27/2009  . TRANSAMINASES, SERUM, ELEVATED 03/27/2009  . Cough 11/10/2008  . GERD 12/07/2007  . COLONIC POLYPS, ADENOMATOUS, HX OF 12/07/2007  . EXTERNAL HEMORRHOIDS 09/11/2007  . CROHN'S DISEASE, LARGE AND SMALL INTESTINES 09/11/2007  . OSTEOPOROSIS 09/11/2007  . HYPERLIPIDEMIA NEC/NOS 02/27/2007  . Essential hypertension 02/27/2007    Past Surgical History:  Procedure Laterality Date  . CATARACT EXTRACTION Bilateral   . COLONOSCOPY    . ELBOW SURGERY  2012   elbow MRSA infection   . EYE SURGERY     cataracts removed, /w IOL  . INGUINAL HERNIA REPAIR Right 01/14/2020   Procedure: OPEN RIGHT HERNIA  REPAIR INGUINAL WITH MESH;  Surgeon: [name], MD;  Location: [facility];  Service: General;  Laterality: Right;  . INSERTION OF MESH N/A 07/01/2016   Procedure: INSERTION OF MESH;  Surgeon: [name], MD;  Location: [facility];  Service: General;  Laterality: N/A;  . IR URETERAL STENT LEFT NEW ACCESS W/O SEP NEPHROSTOMY CATH  02/02/2018  . NEPHROLITHOTOMY Left 02/02/2018   Procedure: NEPHROLITHOTOMY PERCUTANEOUS;  Surgeon: [name], MD;  Location: [facility];  Service: Urology;  Laterality: Left;  . POLYPECTOMY    . SCALP LACERATION REPAIR Right 10/16/2017   From fall/staples  . SHOULDER ARTHROSCOPY Right 03/05/2018   Procedure: ARTHROSCOPY SHOULDER AND OPEN DISTAL CLAVICLE EXCISION;  Surgeon: [name], MD;  Location: [facility];  Service: Orthopedics;  Laterality: Right;  . SHOULDER SURGERY    . SINUS SURGERY WITH INSTATRAK    . TEE WITHOUT CARDIOVERSION N/A 05/09/2017   Procedure: TRANSESOPHAGEAL ECHOCARDIOGRAM (TEE);  Surgeon: [name], MD;  Location: [facility] ENDOSCOPY;  Service: Cardiovascular;  Laterality: N/A;  . TRANSTHORACIC ECHOCARDIOGRAM  02/22/2011   EF 55-65%; increased pattern of LVH with mild conc hypertrophy, abnormal relaxation & increased filling pressure (grade 2 diastolic dysfunction); atrial septum thickened (lipomatous hypertrophy)  . UMBILICAL HERNIA REPAIR N/A 07/01/2016   Procedure: UMBILICAL HERNIA REPAIR WITH MESH;  Surgeon: [name], MD;  Location: [facility];  Service: General;  Laterality: N/A;  . UPPER GASTROINTESTINAL ENDOSCOPY    . VASECTOMY         Family History  Problem Relation Age of Onset  . Colon polyps Father   . Stroke Father   . Hyperlipidemia Father   . Hypertension Father   . Heart disease Mother        CABG at age 50  . Hyperlipidemia Mother   . Hypertension Mother   . Stroke Paternal Grandmother   . Stroke Paternal Grandfather   . Other Child        tetrology of fallot (cornealia deland syndrome)  . Colon cancer Neg Hx   .  Esophageal cancer Neg Hx   . Stomach cancer Neg Hx   . Rectal cancer Neg Hx     Social History   Tobacco Use  . Smoking status: Never Smoker  . Smokeless tobacco: Never Used  Vaping Use  . Vaping Use: Never used  Substance Use Topics  . Alcohol use: No  . Drug use: No    Home Medications Prior to Admission medications   Medication Sig Start Date End Date Taking? Authorizing Provider  clindamycin (CLEOCIN) 300 MG capsule Take 1 capsule (300 mg total) by mouth 3 (three) times daily for 7 days. 11/23/20 11/30/20 Yes Arnaldo Natal, MD  lidocaine (XYLOCAINE) 2 % solution Use as directed 15 mLs in the mouth or throat as needed for mouth pain. 11/23/20  Yes Arnaldo Natal, MD  Ascorbic Acid (VITAMIN C PO) Take 1 tablet by mouth daily.    [provider]  Calcium Carbonate-Vitamin D (CALCIUM 600+D PO) Take 1 tablet by mouth 2 (two) times daily.    [provider]  cholecalciferol (VITAMIN D) 1000 units tablet Take 1,000 Units at bedtime by mouth.     [provider]  eletriptan (RELPAX) 20 MG tablet Take 1 tablet (20 mg total) by mouth as needed for migraine or headache. May repeat in 2 hours if headache persists or recurs. 07/12/19   Daleen Squibb, MD  esomeprazole (NEXIUM) 40 MG capsule TAKE 1 CAPSULE TWICE DAILY 10/02/20   Ladene Artist, MD  Eszopiclone 3 MG TABS Take 3 mg by mouth at bedtime. Take immediately before bedtime    [provider]  folic acid (FOLVITE) 1 MG tablet Take 2 tablets (2 mg total) by mouth daily. 01/11/20   Jacelyn Pi, Lilia Argue, MD  glucose blood (CHOICE DM FORA G20 TEST STRIPS) test strip Use as instructed 11/13/12   [name], MD  Ibuprofen-diphenhydrAMINE Cit (ADVIL PM PO) Take 1 tablet by mouth at bedtime.     [provider]  Lactase (DAIRY RELIEF PO) Take 2 tablets by mouth in the morning and at bedtime.    [provider]  lamoTRIgine (LAMICTAL) 150 MG tablet Take 1 tablet (150 mg total) by mouth  2 (two) times daily. 11/09/20   Lomax, Amy, NP  latanoprost (XALATAN) 0.005 % ophthalmic solution Place 1 drop into both eyes at bedtime.     [provider]  mercaptopurine (PURINETHOL) 50 MG tablet TAKE 2 TABLETS DAILY ON AN EMPTY STOMACH 1 HOUR BEFOREOR 2 HOURS AFTER A MEAL.   CAUTION: CHEMOTHERAPY 10/02/20   Ladene Artist, MD  mesalamine (LIALDA) 1.2 g EC tablet TAKE 2 TABLETS DAILY WITH  BREAKFAST 10/02/20   Ladene Artist, MD  methscopolamine (PAMINE FORTE) 5 MG tablet TAKE 1 TABLET TWICE A DAY 10/02/20   Ladene Artist, MD  ONE TOUCH ULTRA TEST test strip USE AS INSTRUCTED    Weber, Damaris Hippo, PA-C  potassium chloride SA (KLOR-CON) 20 MEQ tablet Take 1 tablet (20 mEq total) by mouth 2 (two) times daily. 01/11/20   Daleen Squibb, MD  Probiotic Product (PROBIOTIC PO) Take 1 tablet at bedtime by mouth.     [provider]  sertraline (ZOLOFT) 100 MG tablet Take 200 mg daily with breakfast by mouth.  03/16/17   [provider]  traZODone (DESYREL) 150 MG tablet Take 150 mg by mouth at bedtime.    [provider]  XIFAXAN 550 MG TABS tablet TAKE 1 TABLET TWICE A DAY 10/02/20   Ladene Artist, MD    Allergies    Azithromycin, Beef-derived products, Milk-related compounds, Penicillins, Sulfonamide derivatives, and Dilaudid [hydromorphone hcl]  Review of Systems   Review of Systems  Constitutional: Negative for chills and fever.  HENT: Negative for ear pain and sore throat.   Eyes: Negative for pain and visual disturbance.  Respiratory: Negative for cough and shortness of breath.   Cardiovascular: Negative for chest pain and palpitations.  Gastrointestinal: Negative for abdominal pain and vomiting.  Genitourinary: Negative for dysuria and hematuria.  Musculoskeletal: Negative for arthralgias and back pain.  Skin: Negative for color change and rash.  Neurological: Negative for seizures, syncope and headaches.  All other systems reviewed and are  negative.   Physical Exam Updated Vital Signs  BP 121/73   Pulse 68   Temp 99 F (37.2 C) (Oral)   Resp 16   Ht 5' 11"  (1.803 m)   Wt 81.6 kg   SpO2 94%   BMI 25.10 kg/m   Physical Exam Vitals and nursing note reviewed.  Constitutional:      Appearance: Normal appearance.  HENT:     Head: Normocephalic and atraumatic.     Mouth/Throat:     Mouth: Mucous membranes are dry.     Pharynx: Posterior oropharyngeal erythema present. No oropharyngeal exudate.     Comments: Voice is normal.  Handling secretions. Eyes:     Conjunctiva/sclera: Conjunctivae normal.  Pulmonary:     Effort: Pulmonary effort is normal. No respiratory distress.  Musculoskeletal:        General: No deformity. Normal range of motion.     Cervical back: Normal range of motion.  Lymphadenopathy:     Cervical: Cervical adenopathy present.  Skin:    General: Skin is warm and dry.  Neurological:     General: No focal deficit present.     Mental Status: He is alert and oriented to person, place, and time. Mental status is at baseline.  Psychiatric:        Mood and Affect: Mood normal.     ED Results / Procedures / Treatments   Labs (all labs ordered are listed, but only abnormal results are displayed) Labs Reviewed  COMPREHENSIVE METABOLIC PANEL - Abnormal; Notable for the following components:      Result Value   Potassium 3.4 (*)    Glucose, Bld 121 (*)    Calcium 8.6 (*)    Total Protein 6.0 (*)    Alkaline Phosphatase 137 (*)    Total Bilirubin 2.3 (*)    All other components within normal limits  CBC WITH DIFFERENTIAL/PLATELET - Abnormal; Notable for the following components:   WBC 3.8 (*)    RBC 2.87 (*)    Hemoglobin 11.0 (*)    HCT 30.6 (*)    MCV 106.6 (*)    MCH 38.3 (*)    Platelets 73 (*)    Lymphs Abs 0.4 (*)    All other components within normal limits  GROUP A STREP BY PCR    EKG None  Radiology CT Soft Tissue Neck Wo Contrast  Result Date: 11/23/2020 CLINICAL DATA:   Concern for neck abscess. Patient has had difficulty swallowing for 3 days with sore throat and small lymph nodes. EXAM: CT NECK WITHOUT CONTRAST TECHNIQUE: Multidetector CT imaging of the neck was performed following the standard protocol without intravenous contrast. COMPARISON:  CT cervical spine September 08, 2020. FINDINGS: Pharynx and larynx: Evaluation is limited by streak artifact at the oropharynx. Mildly prominent tonsils bilaterally, which are symmetric. No clear mass or substantial inflammatory change. While evaluation is limited without contrast and given streak artifact, no visible drainable fluid collection Salivary glands: No inflammation, mass, or stone. Thyroid: Normal. Lymph nodes: None enlarged or abnormal density. Vascular: Nondiagnostic evaluation in the absence of contrast. Limited intracranial: On limited assessment, no obvious acute intracranial abnormality in the visualized brain. Visualized orbits: Negative. Mastoids and visualized paranasal sinuses: Largely clear visualized sinuses with evidence of prior endoscopic sinus surgery. Clear sinuses. Skeleton: Large/bulky bridging anterior osteophytes throughout the cervical spine. Remote T3 compression fracture with similar height loss. Upper chest: Visualized lung apices are clear. IMPRESSION: 1. Mild prominence of the tonsils bilaterally, which is nonspecific but could represent hypertrophy or tonsillitis given the provided  clinical history. Evaluation is limited by streak artifact at the oropharynx and the absence of contrast without visible drainable fluid collection. Recommend correlation with direct inspection. 2. Large/bulky bridging anterior osteophytes throughout the cervical spine, which could cause dysphagia/odynophagia. Findings are suggestive of diffuse idiopathic skeletal hyperostosis ([name]). Electronically Signed   By: Margaretha Sheffield MD   On: 11/23/2020 11:18    Procedures Procedures   Medications Ordered in ED Medications   ketorolac (TORADOL) 30 MG/ML injection 30 mg (30 mg Intravenous Given 11/23/20 1032)    ED Course  I have reviewed the triage vital signs and the nursing notes.  Pertinent labs & imaging results that were available during my care of the patient were reviewed by me and considered in my medical decision making (see chart for details).  This patient was evaluated during a time of global shortage of iodinated contrast media. Based on guidance from the [employer] of Radiology, best practices, and local institutional approaches an alternative path for evaluating and managing the patient may have been employed in order to provide optimal care during this shortage. The current situation has been discussed with the patient    MDM Rules/Calculators/A&P                          [name] [name] is overall well-appearing.  He presents with several days of a sore throat.  Visible pharyngeal exam reveals erythema somewhat diffusely.  I do not have concern for eminent airway emergency.  CT scan was performed to evaluate for evidence of deep space abscess.  I will treat him for possible bacterial pharyngitis given his multiple underlying medical conditions that put him at risk for more severe illness.  I would like him to see ENT if he does not improve.  Otherwise, lab values appear close to his baseline, and he has known cirrhosis and its resultant anemia and thrombocytopenia. Final Clinical Impression(s) / ED Diagnoses Final diagnoses:  Tonsillitis  Anemia, unspecified type  Thrombocytopenia ([place])    Rx / DC Orders ED Discharge Orders         Ordered    clindamycin (CLEOCIN) 300 MG capsule  3 times daily        11/23/20 1129    lidocaine (XYLOCAINE) 2 % solution  As needed        11/23/20 1129           Arnaldo Natal, MD 11/23/20 1133

## 2020-11-25 ENCOUNTER — Encounter (HOSPITAL_BASED_OUTPATIENT_CLINIC_OR_DEPARTMENT_OTHER): Payer: Self-pay

## 2020-11-25 ENCOUNTER — Other Ambulatory Visit: Payer: Self-pay

## 2020-11-25 ENCOUNTER — Emergency Department (HOSPITAL_BASED_OUTPATIENT_CLINIC_OR_DEPARTMENT_OTHER)
Admission: EM | Admit: 2020-11-25 | Discharge: 2020-11-25 | Disposition: A | Discharge status: Home / Self Care | Payer: 59 | Attending: Emergency Medicine | Admitting: Emergency Medicine

## 2020-11-25 DIAGNOSIS — I10 Essential (primary) hypertension: Secondary | ICD-10-CM | POA: Diagnosis not present

## 2020-11-25 DIAGNOSIS — E119 Type 2 diabetes mellitus without complications: Secondary | ICD-10-CM | POA: Diagnosis not present

## 2020-11-25 DIAGNOSIS — Z85828 Personal history of other malignant neoplasm of skin: Secondary | ICD-10-CM | POA: Diagnosis not present

## 2020-11-25 DIAGNOSIS — J029 Acute pharyngitis, unspecified: Secondary | ICD-10-CM | POA: Insufficient documentation

## 2020-11-25 DIAGNOSIS — K0889 Other specified disorders of teeth and supporting structures: Secondary | ICD-10-CM | POA: Diagnosis not present

## 2020-11-25 MED ORDER — DEXAMETHASONE 6 MG PO TABS
10.0000 mg | ORAL_TABLET | Freq: Once | ORAL | Status: AC
  Administered 2020-11-25: 10 mg via ORAL
  Filled 2020-11-25: qty 1

## 2020-11-25 MED ORDER — OXYCODONE HCL 5 MG PO TABS: 5.0000 mg | ORAL_TABLET | Freq: Four times a day (QID) | ORAL | 0 refills | Status: DC | PRN

## 2020-11-25 MED ORDER — OXYCODONE HCL 5 MG PO TABS
5.0000 mg | ORAL_TABLET | Freq: Once | ORAL | Status: AC
  Administered 2020-11-25: 5 mg via ORAL
  Filled 2020-11-25: qty 1

## 2020-11-25 NOTE — ED Triage Notes (Signed)
Pt was seen the other day for tonsillitis and was prescribed abx on 11/23/20. Pt states the pain is worse and the swelling on the right side of his mouth has increased. Pt states this is more dental pain vs. Throat pain.

## 2020-11-25 NOTE — ED Provider Notes (Signed)
[facility] EMERGENCY DEPT Provider Note   CSN: [id number] Arrival date & time: 11/25/20  1930     History Chief Complaint  Patient presents with  . Sore Throat  . Dental Pain    [name] [name] is a 60 y.o. male.  Patient here with ongoing dental pain and sore throat.  Was started on antibiotics 2 days ago.  Sore throat is improving but right lower dental pain about the same.  Is supposed to see dentist on Monday.  The history is provided by the patient.  Dental Pain Location:  Lower Quality:  Aching Severity:  Mild Onset quality:  Gradual Timing:  Intermittent Progression:  Waxing and waning Chronicity:  New Context: dental caries   Relieved by:  Nothing Worsened by:  Nothing Associated symptoms: gum swelling   Associated symptoms: no congestion, no difficulty swallowing, no drooling, no facial pain, no facial swelling, no fever, no headaches, no neck pain, no neck swelling, no oral bleeding and no oral lesions        Past Medical History:  Diagnosis Date  . Adenomatous colon polyp 10/1999  . Anxiety   . Arthritis    neck, yoga helps.  . At high risk for falls    due to Seizure disorder  . Cancer ([place])    skin  . Cataract   . Complex partial seizure ([place])    last seizure was 08-30-2018  . Crohn's disease of small and large intestines ([place]) 1999  . Depression   . Diabetes mellitus    no meds at this time 10-24-17  . Esophageal stricture   . External hemorrhoids   . GERD (gastroesophageal reflux disease)   . Glaucoma   . History of kidney stones    3 large stones still present  . Hyperlipemia   . Hypertension    resolved with weight loss   . Inguinal hernia, bilateral 12/2019  . Migraines   . MRSA (methicillin resistant Staphylococcus aureus) 10/2010   . Osteoporosis   . PONV (postoperative nausea and vomiting)   . PTSD (post-traumatic stress disorder)   . Renal cyst, acquired, left 07/08/2017  . Renal lesion 05/28/2017  . Seizures ([place])     last seizure was yesterday per pt in [place]- has seizures at [place] 6 days a week   . Skin cancer    squamous cell multiple; [facility]; followed every 3 months.  . Sleep apnea    CPAP machine, uses nightly  . Staphylococcus aureus bacteremia with sepsis ([place]) 05/28/2017    Patient Active Problem List   Diagnosis Date Noted  . S/P inguinal hernia repair 01/14/2020  . At high risk for falls   . S/P shoulder surgery 03/05/2018  . Excessive daytime sleepiness 02/18/2018  . Crohn's disease of colon with rectal bleeding ([place]) 02/18/2018  . Blood in stool 02/18/2018  . Iron deficiency anemia due to chronic blood loss 02/18/2018  . Iron deficiency anemia due to sideropenic dysphagia 02/18/2018  . Renal calculus, left 02/02/2018  . Fatigue 12/16/2017  . History of sepsis 12/16/2017  . Therapeutic drug monitoring 10/07/2017  . Renal cyst, acquired, left 07/08/2017  . Frequent falls 07/08/2017  . Staphylococcus aureus bacteremia with sepsis ([place]) 05/28/2017  . Renal lesion 05/28/2017  . Bacteremia due to Staphylococcus aureus 05/06/2017  . MSSA bacteremia 05/06/2017  . Confusion 04/02/2017  . Syncope 08/30/2016  . Depression 04/30/2016  . Memory loss 04/30/2016  . OSA on CPAP 10/31/2015  . Nonepileptic episode ([place]) 04/25/2015  .  Seizure ([place]) 04/04/2013  . Abnormal EKG 03/12/2013  . DOE (dyspnea on exertion) 03/12/2013  . Complex partial seizure ([place]) 04/19/2012  . DM2 (diabetes mellitus, type 2) ([place]) 09/25/2011  . Altered mental status 09/25/2011  . OTHER DYSPHAGIA 03/27/2009  . TRANSAMINASES, SERUM, ELEVATED 03/27/2009  . Cough 11/10/2008  . GERD 12/07/2007  . COLONIC POLYPS, ADENOMATOUS, HX OF 12/07/2007  . EXTERNAL HEMORRHOIDS 09/11/2007  . CROHN'S DISEASE, LARGE AND SMALL INTESTINES 09/11/2007  . OSTEOPOROSIS 09/11/2007  . HYPERLIPIDEMIA NEC/NOS 02/27/2007  . Essential hypertension 02/27/2007    Past Surgical History:  Procedure Laterality Date  . CATARACT EXTRACTION  Bilateral   . COLONOSCOPY    . ELBOW SURGERY  2012   elbow MRSA infection   . EYE SURGERY     cataracts removed, /w IOL  . INGUINAL HERNIA REPAIR Right 01/14/2020   Procedure: OPEN RIGHT HERNIA REPAIR INGUINAL WITH MESH;  Surgeon: [name], MD;  Location: [facility];  Service: General;  Laterality: Right;  . INSERTION OF MESH N/A 07/01/2016   Procedure: INSERTION OF MESH;  Surgeon: [name], MD;  Location: [facility];  Service: General;  Laterality: N/A;  . IR URETERAL STENT LEFT NEW ACCESS W/O SEP NEPHROSTOMY CATH  02/02/2018  . NEPHROLITHOTOMY Left 02/02/2018   Procedure: NEPHROLITHOTOMY PERCUTANEOUS;  Surgeon: [name], MD;  Location: [facility];  Service: Urology;  Laterality: Left;  . POLYPECTOMY    . SCALP LACERATION REPAIR Right 10/16/2017   From fall/staples  . SHOULDER ARTHROSCOPY Right 03/05/2018   Procedure: ARTHROSCOPY SHOULDER AND OPEN DISTAL CLAVICLE EXCISION;  Surgeon: [name], MD;  Location: [facility];  Service: Orthopedics;  Laterality: Right;  . SHOULDER SURGERY    . SINUS SURGERY WITH INSTATRAK    . TEE WITHOUT CARDIOVERSION N/A 05/09/2017   Procedure: TRANSESOPHAGEAL ECHOCARDIOGRAM (TEE);  Surgeon: [name], MD;  Location: [facility]COPY;  Service: Cardiovascular;  Laterality: N/A;  . TRANSTHORACIC ECHOCARDIOGRAM  02/22/2011   EF 55-65%; increased pattern of LVH with mild conc hypertrophy, abnormal relaxation & increased filling pressure (grade 2 diastolic dysfunction); atrial septum thickened (lipomatous hypertrophy)  . UMBILICAL HERNIA REPAIR N/A 07/01/2016   Procedure: UMBILICAL HERNIA REPAIR WITH MESH;  Surgeon: [name], MD;  Location: [facility];  Service: General;  Laterality: N/A;  . UPPER GASTROINTESTINAL ENDOSCOPY    . VASECTOMY         Family History  Problem Relation Age of Onset  . Colon polyps Father   . Stroke Father   . Hyperlipidemia Father   . Hypertension Father   . Heart disease Mother        CABG at age 69  . Hyperlipidemia  Mother   . Hypertension Mother   . Stroke Paternal Grandmother   . Stroke Paternal Grandfather   . Other Child        tetrology of fallot (cornealia deland syndrome)  . Colon cancer Neg Hx   . Esophageal cancer Neg Hx   . Stomach cancer Neg Hx   . Rectal cancer Neg Hx     Social History   Tobacco Use  . Smoking status: Never Smoker  . Smokeless tobacco: Never Used  Vaping Use  . Vaping Use: Never used  Substance Use Topics  . Alcohol use: No  . Drug use: No    Home Medications Prior to Admission medications   Medication Sig Start Date End Date Taking? Authorizing Provider  oxyCODONE (ROXICODONE) 5 MG immediate release tablet Take 1 tablet (5 mg total)  by mouth every 6 (six) hours as needed for up to 10 doses. 11/25/20  Yes Manolo Bosket, DO  Ascorbic Acid (VITAMIN C PO) Take 1 tablet by mouth daily.    [provider]  Calcium Carbonate-Vitamin D (CALCIUM 600+D PO) Take 1 tablet by mouth 2 (two) times daily.    [provider]  cholecalciferol (VITAMIN D) 1000 units tablet Take 1,000 Units at bedtime by mouth.     [provider]  clindamycin (CLEOCIN) 300 MG capsule Take 1 capsule (300 mg total) by mouth 3 (three) times daily for 7 days. 11/23/20 11/30/20  Arnaldo Natal, MD  eletriptan (RELPAX) 20 MG tablet Take 1 tablet (20 mg total) by mouth as needed for migraine or headache. May repeat in 2 hours if headache persists or recurs. 07/12/19   Daleen Squibb, MD  esomeprazole (NEXIUM) 40 MG capsule TAKE 1 CAPSULE TWICE DAILY 10/02/20   Ladene Artist, MD  Eszopiclone 3 MG TABS Take 3 mg by mouth at bedtime. Take immediately before bedtime    [provider]  folic acid (FOLVITE) 1 MG tablet Take 2 tablets (2 mg total) by mouth daily. 01/11/20   Jacelyn Pi, Lilia Argue, MD  glucose blood (CHOICE DM FORA G20 TEST STRIPS) test strip Use as instructed 11/13/12   [name], MD  Ibuprofen-diphenhydrAMINE Cit (ADVIL PM PO) Take 1 tablet by mouth  at bedtime.     [provider]  Lactase (DAIRY RELIEF PO) Take 2 tablets by mouth in the morning and at bedtime.    [provider]  lamoTRIgine (LAMICTAL) 150 MG tablet Take 1 tablet (150 mg total) by mouth 2 (two) times daily. 11/09/20   Lomax, Amy, NP  latanoprost (XALATAN) 0.005 % ophthalmic solution Place 1 drop into both eyes at bedtime.     [provider]  lidocaine (XYLOCAINE) 2 % solution Use as directed 15 mLs in the mouth or throat as needed for mouth pain. 11/23/20   Arnaldo Natal, MD  mercaptopurine (PURINETHOL) 50 MG tablet TAKE 2 TABLETS DAILY ON AN EMPTY STOMACH 1 HOUR BEFOREOR 2 HOURS AFTER A MEAL.   CAUTION: CHEMOTHERAPY 10/02/20   Ladene Artist, MD  mesalamine (LIALDA) 1.2 g EC tablet TAKE 2 TABLETS DAILY WITH  BREAKFAST 10/02/20   Ladene Artist, MD  methscopolamine (PAMINE FORTE) 5 MG tablet TAKE 1 TABLET TWICE A DAY 10/02/20   Ladene Artist, MD  ONE TOUCH ULTRA TEST test strip USE AS INSTRUCTED    Weber, Damaris Hippo, PA-C  potassium chloride SA (KLOR-CON) 20 MEQ tablet Take 1 tablet (20 mEq total) by mouth 2 (two) times daily. 01/11/20   Daleen Squibb, MD  Probiotic Product (PROBIOTIC PO) Take 1 tablet at bedtime by mouth.     [provider]  sertraline (ZOLOFT) 100 MG tablet Take 200 mg daily with breakfast by mouth.  03/16/17   [provider]  traZODone (DESYREL) 150 MG tablet Take 150 mg by mouth at bedtime.    [provider]  XIFAXAN 550 MG TABS tablet TAKE 1 TABLET TWICE A DAY 10/02/20   Ladene Artist, MD    Allergies    Azithromycin, Beef-derived products, Milk-related compounds, Penicillins, Sulfonamide derivatives, and Dilaudid [hydromorphone hcl]  Review of Systems   Review of Systems  Constitutional: Negative for fever.  HENT: Positive for dental problem and sore throat. Negative for congestion, drooling, ear discharge, ear pain, facial swelling, hearing loss, mouth sores,  nosebleeds, postnasal  drip, rhinorrhea, trouble swallowing and voice change.   Gastrointestinal: Negative for nausea.  Musculoskeletal: Negative for neck pain.  Neurological: Negative for headaches.    Physical Exam Updated Vital Signs BP (!) 170/86 (BP Location: Right Arm)   Pulse 83   Temp 98.6 F (37 C) (Oral)   Resp 17   Ht 5' 11"  (1.803 m)   Wt 81.6 kg   SpO2 98%   BMI 25.09 kg/m   Physical Exam Vitals and nursing note reviewed.  HENT:     Head: Normocephalic and atraumatic.     Comments: Overall no signs of abscess in the posterior oropharynx, there is tenderness to the focal right lower tooth with some gingival swelling and inflammation, there is no significant swelling to the submandibular space or underneath the tongue, no trismus, no drooling    Mouth/Throat:     Mouth: Mucous membranes are moist.     Pharynx: No pharyngeal swelling, oropharyngeal exudate, posterior oropharyngeal erythema or uvula swelling.     Tonsils: No tonsillar exudate or tonsillar abscesses. 0 on the right. 0 on the left.  Eyes:     Conjunctiva/sclera: Conjunctivae normal.     Pupils: Pupils are equal, round, and reactive to light.  Neurological:     Mental Status: He is alert.     ED Results / Procedures / Treatments   Labs (all labs ordered are listed, but only abnormal results are displayed) Labs Reviewed - No data to display  EKG None  Radiology No results found.  Procedures Procedures   Medications Ordered in ED Medications  oxyCODONE (Oxy IR/ROXICODONE) immediate release tablet 5 mg (has no administration in time range)  dexamethasone (DECADRON) tablet 10 mg (has no administration in time range)    ED Course  I have reviewed the triage vital signs and the nursing notes.  Pertinent labs & imaging results that were available during my care of the patient were reviewed by me and considered in my medical decision making (see chart for details).    MDM Rules/Calculators/A&P                           [name] [name] is here with ongoing dental pain.  Was prescribed clindamycin after having unremarkable CT scan of the neck several days ago for sore throat.  Throat overall looks without signs of deep space infection.  He is mostly complaining of right lower dental pain.  There is no obvious abscess but there is some gingival inflammation and suspect dental carry or small periapical abscess.  There is no submandibular swelling and no trismus no drooling.  He has follow-up with dentistry next week.  We will give him a dose of Decadron to help with pain and will write for small course of narcotic pain medicine for breakthrough pain.  This chart was dictated using voice recognition software.  Despite best efforts to proofread,  errors can occur which can change the documentation meaning.   Final Clinical Impression(s) / ED Diagnoses Final diagnoses:  Pain, dental  Sore throat    Rx / DC Orders ED Discharge Orders         Ordered    oxyCODONE (ROXICODONE) 5 MG immediate release tablet  Every 6 hours PRN        11/25/20 1942           Lennice Sites, DO 11/25/20 1945

## 2020-11-25 NOTE — Discharge Instructions (Addendum)
Follow-up with your dentist as discussed.  Continue antibiotics.

## 2020-12-03 ENCOUNTER — Ambulatory Visit (HOSPITAL_COMMUNITY): Payer: 59

## 2020-12-04 ENCOUNTER — Inpatient Hospital Stay (HOSPITAL_BASED_OUTPATIENT_CLINIC_OR_DEPARTMENT_OTHER)
Admission: EM | Admit: 2020-12-04 | Discharge: 2020-12-11 | DRG: 137 | Disposition: A | Discharge status: Home Health | Payer: 59 | Attending: Family Medicine | Admitting: Family Medicine

## 2020-12-04 ENCOUNTER — Emergency Department (HOSPITAL_BASED_OUTPATIENT_CLINIC_OR_DEPARTMENT_OTHER): Payer: 59

## 2020-12-04 ENCOUNTER — Telehealth: Payer: Self-pay | Admitting: Neurology

## 2020-12-04 ENCOUNTER — Emergency Department (HOSPITAL_BASED_OUTPATIENT_CLINIC_OR_DEPARTMENT_OTHER): Payer: 59 | Admitting: Radiology

## 2020-12-04 ENCOUNTER — Encounter (HOSPITAL_BASED_OUTPATIENT_CLINIC_OR_DEPARTMENT_OTHER): Payer: Self-pay

## 2020-12-04 DIAGNOSIS — R627 Adult failure to thrive: Secondary | ICD-10-CM | POA: Diagnosis present

## 2020-12-04 DIAGNOSIS — E1169 Type 2 diabetes mellitus with other specified complication: Secondary | ICD-10-CM | POA: Diagnosis present

## 2020-12-04 DIAGNOSIS — Z8614 Personal history of Methicillin resistant Staphylococcus aureus infection: Secondary | ICD-10-CM

## 2020-12-04 DIAGNOSIS — F0391 Unspecified dementia with behavioral disturbance: Secondary | ICD-10-CM | POA: Diagnosis present

## 2020-12-04 DIAGNOSIS — D638 Anemia in other chronic diseases classified elsewhere: Secondary | ICD-10-CM | POA: Diagnosis present

## 2020-12-04 DIAGNOSIS — Z8249 Family history of ischemic heart disease and other diseases of the circulatory system: Secondary | ICD-10-CM

## 2020-12-04 DIAGNOSIS — Z20822 Contact with and (suspected) exposure to covid-19: Secondary | ICD-10-CM | POA: Diagnosis present

## 2020-12-04 DIAGNOSIS — M272 Inflammatory conditions of jaws: Principal | ICD-10-CM | POA: Diagnosis present

## 2020-12-04 DIAGNOSIS — Z8371 Family history of colonic polyps: Secondary | ICD-10-CM

## 2020-12-04 DIAGNOSIS — Z881 Allergy status to other antibiotic agents status: Secondary | ICD-10-CM

## 2020-12-04 DIAGNOSIS — H532 Diplopia: Secondary | ICD-10-CM | POA: Diagnosis present

## 2020-12-04 DIAGNOSIS — R413 Other amnesia: Secondary | ICD-10-CM | POA: Diagnosis not present

## 2020-12-04 DIAGNOSIS — L03211 Cellulitis of face: Secondary | ICD-10-CM | POA: Diagnosis present

## 2020-12-04 DIAGNOSIS — K122 Cellulitis and abscess of mouth: Secondary | ICD-10-CM

## 2020-12-04 DIAGNOSIS — R1310 Dysphagia, unspecified: Secondary | ICD-10-CM | POA: Diagnosis present

## 2020-12-04 DIAGNOSIS — A419 Sepsis, unspecified organism: Secondary | ICD-10-CM | POA: Diagnosis present

## 2020-12-04 DIAGNOSIS — R509 Fever, unspecified: Secondary | ICD-10-CM

## 2020-12-04 DIAGNOSIS — R0602 Shortness of breath: Secondary | ICD-10-CM

## 2020-12-04 DIAGNOSIS — M869 Osteomyelitis, unspecified: Secondary | ICD-10-CM | POA: Diagnosis present

## 2020-12-04 DIAGNOSIS — Z880 Allergy status to penicillin: Secondary | ICD-10-CM

## 2020-12-04 DIAGNOSIS — K7460 Unspecified cirrhosis of liver: Secondary | ICD-10-CM | POA: Diagnosis present

## 2020-12-04 DIAGNOSIS — R569 Unspecified convulsions: Secondary | ICD-10-CM

## 2020-12-04 DIAGNOSIS — R519 Headache, unspecified: Secondary | ICD-10-CM

## 2020-12-04 DIAGNOSIS — F419 Anxiety disorder, unspecified: Secondary | ICD-10-CM | POA: Diagnosis present

## 2020-12-04 DIAGNOSIS — I10 Essential (primary) hypertension: Secondary | ICD-10-CM | POA: Diagnosis present

## 2020-12-04 DIAGNOSIS — Z882 Allergy status to sulfonamides status: Secondary | ICD-10-CM

## 2020-12-04 DIAGNOSIS — D696 Thrombocytopenia, unspecified: Secondary | ICD-10-CM | POA: Diagnosis present

## 2020-12-04 DIAGNOSIS — R070 Pain in throat: Secondary | ICD-10-CM | POA: Diagnosis not present

## 2020-12-04 DIAGNOSIS — K219 Gastro-esophageal reflux disease without esophagitis: Secondary | ICD-10-CM | POA: Diagnosis present

## 2020-12-04 DIAGNOSIS — K5080 Crohn's disease of both small and large intestine without complications: Secondary | ICD-10-CM | POA: Diagnosis present

## 2020-12-04 DIAGNOSIS — Z83438 Family history of other disorder of lipoprotein metabolism and other lipidemia: Secondary | ICD-10-CM

## 2020-12-04 DIAGNOSIS — D539 Nutritional anemia, unspecified: Secondary | ICD-10-CM | POA: Diagnosis present

## 2020-12-04 DIAGNOSIS — D849 Immunodeficiency, unspecified: Secondary | ICD-10-CM | POA: Diagnosis present

## 2020-12-04 DIAGNOSIS — Z79899 Other long term (current) drug therapy: Secondary | ICD-10-CM

## 2020-12-04 DIAGNOSIS — G4733 Obstructive sleep apnea (adult) (pediatric): Secondary | ICD-10-CM | POA: Diagnosis present

## 2020-12-04 DIAGNOSIS — F4310 Post-traumatic stress disorder, unspecified: Secondary | ICD-10-CM | POA: Diagnosis present

## 2020-12-04 DIAGNOSIS — Z86010 Personal history of colonic polyps: Secondary | ICD-10-CM

## 2020-12-04 DIAGNOSIS — M810 Age-related osteoporosis without current pathological fracture: Secondary | ICD-10-CM | POA: Diagnosis present

## 2020-12-04 DIAGNOSIS — R6510 Systemic inflammatory response syndrome (SIRS) of non-infectious origin without acute organ dysfunction: Secondary | ICD-10-CM

## 2020-12-04 DIAGNOSIS — Z91011 Allergy to milk products: Secondary | ICD-10-CM

## 2020-12-04 DIAGNOSIS — E119 Type 2 diabetes mellitus without complications: Secondary | ICD-10-CM

## 2020-12-04 DIAGNOSIS — G40209 Localization-related (focal) (partial) symptomatic epilepsy and epileptic syndromes with complex partial seizures, not intractable, without status epilepticus: Secondary | ICD-10-CM | POA: Diagnosis present

## 2020-12-04 DIAGNOSIS — E785 Hyperlipidemia, unspecified: Secondary | ICD-10-CM | POA: Diagnosis present

## 2020-12-04 DIAGNOSIS — F32A Depression, unspecified: Secondary | ICD-10-CM | POA: Diagnosis present

## 2020-12-04 DIAGNOSIS — K0889 Other specified disorders of teeth and supporting structures: Secondary | ICD-10-CM | POA: Diagnosis present

## 2020-12-04 DIAGNOSIS — G3184 Mild cognitive impairment, so stated: Secondary | ICD-10-CM | POA: Diagnosis present

## 2020-12-04 DIAGNOSIS — K7581 Nonalcoholic steatohepatitis (NASH): Secondary | ICD-10-CM | POA: Diagnosis present

## 2020-12-04 DIAGNOSIS — Z885 Allergy status to narcotic agent status: Secondary | ICD-10-CM

## 2020-12-04 DIAGNOSIS — R6884 Jaw pain: Secondary | ICD-10-CM | POA: Diagnosis not present

## 2020-12-04 DIAGNOSIS — R700 Elevated erythrocyte sedimentation rate: Secondary | ICD-10-CM | POA: Diagnosis present

## 2020-12-04 DIAGNOSIS — E876 Hypokalemia: Secondary | ICD-10-CM | POA: Diagnosis not present

## 2020-12-04 DIAGNOSIS — Z87442 Personal history of urinary calculi: Secondary | ICD-10-CM

## 2020-12-04 DIAGNOSIS — H409 Unspecified glaucoma: Secondary | ICD-10-CM | POA: Diagnosis present

## 2020-12-04 DIAGNOSIS — Z823 Family history of stroke: Secondary | ICD-10-CM

## 2020-12-04 DIAGNOSIS — Z85828 Personal history of other malignant neoplasm of skin: Secondary | ICD-10-CM

## 2020-12-04 DIAGNOSIS — Z9989 Dependence on other enabling machines and devices: Secondary | ICD-10-CM | POA: Diagnosis not present

## 2020-12-04 HISTORY — DX: Unspecified cirrhosis of liver: K74.60

## 2020-12-04 LAB — COMPREHENSIVE METABOLIC PANEL
ALT: 19 U/L (ref 0–44)
AST: 27 U/L (ref 15–41)
Albumin: 4.1 g/dL (ref 3.5–5.0)
Alkaline Phosphatase: 121 U/L (ref 38–126)
Anion gap: 10 (ref 5–15)
BUN: 11 mg/dL (ref 6–20)
CO2: 23 mmol/L (ref 22–32)
Calcium: 8.7 mg/dL — ABNORMAL LOW (ref 8.9–10.3)
Chloride: 105 mmol/L (ref 98–111)
Creatinine, Ser: 0.73 mg/dL (ref 0.61–1.24)
GFR, Estimated: >60 mL/min (ref >60)
Glucose, Bld: 134 mg/dL — ABNORMAL HIGH (ref 70–99)
Potassium: 3.8 mmol/L (ref 3.5–5.1)
Sodium: 138 mmol/L (ref 135–145)
Total Bilirubin: 3.1 mg/dL — ABNORMAL HIGH (ref 0.3–1.2)
Total Protein: 6.8 g/dL (ref 6.5–8.1)

## 2020-12-04 LAB — RAPID URINE DRUG SCREEN, HOSP PERFORMED
Amphetamines: NOT DETECTED
Barbiturates: NOT DETECTED
Benzodiazepines: NOT DETECTED
Cocaine: NOT DETECTED
Opiates: NOT DETECTED
Tetrahydrocannabinol: NOT DETECTED

## 2020-12-04 LAB — URINALYSIS, ROUTINE W REFLEX MICROSCOPIC
Bilirubin Urine: NEGATIVE
Glucose, UA: NEGATIVE mg/dL
Ketones, ur: NEGATIVE mg/dL
Leukocytes,Ua: NEGATIVE
Nitrite: NEGATIVE
Protein, ur: 30 mg/dL — AB
Specific Gravity, Urine: 1.018 (ref 1.005–1.030)
pH: 7.5 (ref 5.0–8.0)

## 2020-12-04 LAB — PROTIME-INR
INR: 1.3 — ABNORMAL HIGH (ref 0.8–1.2)
Prothrombin Time: 16.1 seconds — ABNORMAL HIGH (ref 11.4–15.2)

## 2020-12-04 LAB — CBC WITH DIFFERENTIAL/PLATELET
Abs Immature Granulocytes: 0.05 10*3/uL (ref 0.00–0.07)
Basophils Absolute: 0 10*3/uL (ref 0.0–0.1)
Basophils Relative: 0 %
Eosinophils Absolute: 0.1 10*3/uL (ref 0.0–0.5)
Eosinophils Relative: 1 %
HCT: 33 % — ABNORMAL LOW (ref 39.0–52.0)
Hemoglobin: 12 g/dL — ABNORMAL LOW (ref 13.0–17.0)
Immature Granulocytes: 1 %
Lymphocytes Relative: 6 %
Lymphs Abs: 0.5 10*3/uL — ABNORMAL LOW (ref 0.7–4.0)
MCH: 38.2 pg — ABNORMAL HIGH (ref 26.0–34.0)
MCHC: 36.4 g/dL — ABNORMAL HIGH (ref 30.0–36.0)
MCV: 105.1 fL — ABNORMAL HIGH (ref 80.0–100.0)
Monocytes Absolute: 1 10*3/uL (ref 0.1–1.0)
Monocytes Relative: 11 %
Neutro Abs: 6.9 10*3/uL (ref 1.7–7.7)
Neutrophils Relative %: 81 %
Platelets: 123 10*3/uL — ABNORMAL LOW (ref 150–400)
RBC: 3.14 MIL/uL — ABNORMAL LOW (ref 4.22–5.81)
RDW: 15 % (ref 11.5–15.5)
WBC: 8.5 10*3/uL (ref 4.0–10.5)
nRBC: 0 % (ref 0.0–0.2)

## 2020-12-04 LAB — LACTIC ACID, PLASMA
Lactic Acid, Venous: 1.1 mmol/L (ref 0.5–1.9)
Lactic Acid, Venous: 0.8 mmol/L (ref 0.5–1.9)

## 2020-12-04 LAB — AMMONIA: Ammonia: 62 umol/L — ABNORMAL HIGH (ref 9–35)

## 2020-12-04 LAB — PHOSPHORUS: Phosphorus: 1.9 mg/dL — ABNORMAL LOW (ref 2.5–4.6)

## 2020-12-04 LAB — MAGNESIUM: Magnesium: 1.5 mg/dL — ABNORMAL LOW (ref 1.7–2.4)

## 2020-12-04 MED ORDER — VANCOMYCIN HCL IN DEXTROSE 1-5 GM/200ML-% IV SOLN
1000.0000 mg | Freq: Once | INTRAVENOUS | Status: AC
  Administered 2020-12-04: 1000 mg via INTRAVENOUS
  Filled 2020-12-04: qty 200

## 2020-12-04 MED ORDER — LACTATED RINGERS IV SOLN: INTRAVENOUS | Status: DC

## 2020-12-04 MED ORDER — ACETAMINOPHEN 325 MG PO TABS
650.0000 mg | ORAL_TABLET | Freq: Once | ORAL | Status: AC
  Administered 2020-12-04: 650 mg via ORAL
  Filled 2020-12-04: qty 2

## 2020-12-04 MED ORDER — LACTATED RINGERS IV BOLUS (SEPSIS)
1000.0000 mL | Freq: Once | INTRAVENOUS | Status: AC
  Administered 2020-12-04: 1000 mL via INTRAVENOUS

## 2020-12-04 MED ORDER — LACTATED RINGERS IV BOLUS (SEPSIS)
500.0000 mL | Freq: Once | INTRAVENOUS | Status: AC
  Administered 2020-12-04: 500 mL via INTRAVENOUS

## 2020-12-04 MED ORDER — VANCOMYCIN HCL IN DEXTROSE 1-5 GM/200ML-% IV SOLN
1000.0000 mg | Freq: Two times a day (BID) | INTRAVENOUS | Status: DC
  Filled 2020-12-04: qty 200

## 2020-12-04 MED ORDER — ONDANSETRON HCL 4 MG/2ML IJ SOLN
4.0000 mg | Freq: Once | INTRAMUSCULAR | Status: AC
  Administered 2020-12-05: 4 mg via INTRAVENOUS
  Filled 2020-12-04 (×2): qty 2

## 2020-12-04 MED ORDER — HYDROMORPHONE HCL 1 MG/ML IJ SOLN
1.0000 mg | Freq: Once | INTRAMUSCULAR | Status: DC
Start: 2020-12-04 — End: 2020-12-05
  Filled 2020-12-04 (×2): qty 1

## 2020-12-04 MED ORDER — METRONIDAZOLE 500 MG/100ML IV SOLN
500.0000 mg | Freq: Once | INTRAVENOUS | Status: AC
  Administered 2020-12-04: 500 mg via INTRAVENOUS
  Filled 2020-12-04: qty 100

## 2020-12-04 MED ORDER — CEFEPIME HCL 2 G IJ SOLR
2.0000 g | Freq: Once | INTRAMUSCULAR | Status: AC
  Administered 2020-12-04: 2 g via INTRAVENOUS
  Filled 2020-12-04: qty 2

## 2020-12-04 MED ORDER — KETOROLAC TROMETHAMINE 30 MG/ML IJ SOLN
30.0000 mg | Freq: Once | INTRAMUSCULAR | Status: AC
  Administered 2020-12-04: 30 mg via INTRAVENOUS
  Filled 2020-12-04: qty 1

## 2020-12-04 NOTE — ED Notes (Signed)
Family brought to bedside

## 2020-12-04 NOTE — ED Notes (Signed)
ED Provider at bedside. 

## 2020-12-04 NOTE — ED Triage Notes (Addendum)
Patient arrives via [facility] from home with c/o right side facial pain.. Patient is AOX2 at best. Patient is febrile upon arrival at 102.6 orally. Family noticed knot on right side of face and took patient to the dentist, dentist denied abcess.

## 2020-12-04 NOTE — Telephone Encounter (Signed)
I received a text message from the answerphone service from 7:58 AM this morning regarding the patient having severe pain in his jaw/right side of his face, wants to talk to Doctor about pain.  Sandy: Please call patient back regarding his symptoms and to get further information. Patient has not been seen in this office for jaw pain as far as I can see.  He may have to see his primary care physician for this issue first.  Upon chart review, he had recent emergency room visits for tonsillitis, sore throat and dental pain.

## 2020-12-04 NOTE — Progress Notes (Signed)
Pharmacy Antibiotic Note  [name] [name] is a 60 y.o. male admitted on 12/04/2020 with sepsis.  Pharmacy has been consulted for Vancomycin and Cefepime dosing.  Patient endorses radiating pain, started as jaw pain over the past few days. No abscess per dentist. WBC wnl, febrile. CrCl >36m/min.   Plan: Vancomycin 20022mloading dose Vancomycin 100067mV every 12 hours.  Goal trough 15-20 mcg/mL. Cefepime 2g q8hr IV Follow up clinical status, renal function an cultures. Consider AUC dosing if vanc continued.  Height: 5' 11"  (180.3 cm) Weight: 81.6 kg (179 lb 14.3 oz) IBW/kg (Calculated) : 75.3  Temp (24hrs), Avg:102.6 F (39.2 C), Min:102.6 F (39.2 C), Max:102.6 F (39.2 C)  Recent Labs  Lab 12/04/20 1958  WBC 8.5  CREATININE 0.73  LATICACIDVEN 1.1    Estimated Creatinine Clearance: 107.2 mL/min (by C-G formula based on SCr of 0.73 mg/dL).    Allergies  Allergen Reactions   Azithromycin Swelling    SWELLING REACTION UNSPECIFIED    Beef-Derived Products Diarrhea    RED MEAT > UNSPECIFIED REACTION    Milk-Related Compounds Diarrhea    DAIRY >UNSPECIFIED REACTION    Penicillins Hives    Rash as child- tolerates Ancef/CHILDHOOD ALLERGY Has patient had a PCN reaction causing immediate rash, facial/tongue/throat swelling, SOB or lightheadedness with hypotension: YES Has patient had a PCN reaction causing severe rash involving mucus membranes or skin necrosis: Unknown Has patient had a PCN reaction that required hospitalization: Unknown Has patient had a PCN reaction occurring within the last 10 years:Unknown If all of the above answers are "NO", then may proceed with Cephalosporin us [place]Sulfonamide Derivatives Hives   Dilaudid [Hydromorphone Hcl] Nausea And Vomiting    Antimicrobials this admission: Vanc 6/13 >> Cefepime 6/13 >>  Dose adjustments this admission: NA  Microbiology results: 6/13 Bcx: pend  Thank you for allowing pharmacy to be a part of this patient's  care.  SarNorina BuzzardharmD PGY1 Pharmacy Resident 12/04/2020 8:46 PM

## 2020-12-04 NOTE — Telephone Encounter (Signed)
I called patient he states he has seen the dentist , endodontist in regards to right lower teeth jaw nerve pain.  They state it is related to a nerve and to see neurology.  He states pain radiates toto his right head.  He is level 9 of pain.  I relayed he needs to get a referral  to see MD for this new problem.  He stated he will call their office and have them place the referral. He appreciated call.

## 2020-12-04 NOTE — Progress Notes (Signed)
Sepsis tracking by [facility]

## 2020-12-04 NOTE — ED Provider Notes (Signed)
[facility] EMERGENCY DEPT Provider Note   CSN: [id number] Arrival date & time: 12/04/20  1941     History Chief Complaint  Patient presents with   Facial Pain    [name] [name] is a 60 y.o. male.  HPI Patient has been having an ongoing problem since about June 2 with presentation to the emergency department with several days of sore throat and area of pain on the right side of the face.  At that time a CT scan was done to evaluate for deep space infection.  No acute findings were identified and patient was treated with empiric clindamycin.  With plan close follow-up with ENT.  Since that time he has continued to have problems with facial pain on the right along the jawline and sore throat.  He has been seen by dentist and endodontist as well as his PCP no specific cause has been identified.  Patient's wife reports that he has gotten increasingly confused over the past 24 hours and agitated.  On arrival, patient is identified to have fever of 102.6.  Patient has multiple severe medical comorbidities.  He has Crohn's disease but does not identify signs that suggest an immediate flare.  He has cirrhosis.  Also history of complex partial seizures.    Past Medical History:  Diagnosis Date   Adenomatous colon polyp 10/1999   Anxiety    Arthritis    neck, yoga helps.   At high risk for falls    due to Seizure disorder   Cancer [facility])    skin   Cataract    Complex partial seizure ([place])    last seizure was 08-30-2018   Crohn's disease of small and large intestines ([place]) 1999   Depression    Diabetes mellitus    no meds at this time 10-24-17   Esophageal stricture    External hemorrhoids    GERD (gastroesophageal reflux disease)    Glaucoma    History of kidney stones    3 large stones still present   Hyperlipemia    Hypertension    resolved with weight loss    Inguinal hernia, bilateral 12/2019   Migraines    MRSA (methicillin resistant Staphylococcus aureus) 10/2010     Osteoporosis    PONV (postoperative nausea and vomiting)    PTSD (post-traumatic stress disorder)    Renal cyst, acquired, left 07/08/2017   Renal lesion 05/28/2017   Seizures ([place])    last seizure was yesterday per pt in [place]- has seizures at [place] 6 days a week    Skin cancer    squamous cell multiple; [place]; followed every 3 months.   Sleep apnea    CPAP machine, uses nightly   Staphylococcus aureus bacteremia with sepsis ([place]) 05/28/2017    Patient Active Problem List   Diagnosis Date Noted   Sepsis ([place]) 12/04/2020   S/P inguinal hernia repair 01/14/2020   At high risk for falls    S/P shoulder surgery 03/05/2018   Excessive daytime sleepiness 02/18/2018   Crohn's disease of colon with rectal bleeding ([place]) 02/18/2018   Blood in stool 02/18/2018   Iron deficiency anemia due to chronic blood loss 02/18/2018   Iron deficiency anemia due to sideropenic dysphagia 02/18/2018   Renal calculus, left 02/02/2018   Fatigue 12/16/2017   History of sepsis 12/16/2017   Therapeutic drug monitoring 10/07/2017   Renal cyst, acquired, left 07/08/2017   Frequent falls 07/08/2017   Staphylococcus aureus bacteremia with sepsis ([place]) 05/28/2017   Renal  lesion 05/28/2017   Bacteremia due to Staphylococcus aureus 05/06/2017   MSSA bacteremia 05/06/2017   Confusion 04/02/2017   Syncope 08/30/2016   Depression 04/30/2016   Memory loss 04/30/2016   OSA on CPAP 10/31/2015   Nonepileptic episode ([place]) 04/25/2015   Seizure ([place]) 04/04/2013   Abnormal EKG 03/12/2013   DOE (dyspnea on exertion) 03/12/2013   Complex partial seizure ([place]) 04/19/2012   DM2 (diabetes mellitus, type 2) ([place]) 09/25/2011   Altered mental status 09/25/2011   OTHER DYSPHAGIA 03/27/2009   TRANSAMINASES, SERUM, ELEVATED 03/27/2009   Cough 11/10/2008   GERD 12/07/2007   COLONIC POLYPS, ADENOMATOUS, HX OF 12/07/2007   EXTERNAL HEMORRHOIDS 09/11/2007   CROHN'S DISEASE, LARGE AND SMALL INTESTINES 09/11/2007    OSTEOPOROSIS 09/11/2007   HYPERLIPIDEMIA NEC/NOS 02/27/2007   Essential hypertension 02/27/2007    Past Surgical History:  Procedure Laterality Date   CATARACT EXTRACTION Bilateral    COLONOSCOPY     ELBOW SURGERY  2012   elbow MRSA infection    EYE SURGERY     cataracts removed, /w IOL   INGUINAL HERNIA REPAIR Right 01/14/2020   Procedure: OPEN RIGHT HERNIA REPAIR INGUINAL WITH MESH;  Surgeon: [name], MD;  Location: [facility];  Service: General;  Laterality: Right;   INSERTION OF MESH N/A 07/01/2016   Procedure: INSERTION OF MESH;  Surgeon: [name], MD;  Location: [facility];  Service: General;  Laterality: N/A;   IR URETERAL STENT LEFT NEW ACCESS W/O SEP NEPHROSTOMY CATH  02/02/2018   NEPHROLITHOTOMY Left 02/02/2018   Procedure: NEPHROLITHOTOMY PERCUTANEOUS;  Surgeon: [name], MD;  Location: [facility];  Service: Urology;  Laterality: Left;   POLYPECTOMY     SCALP LACERATION REPAIR Right 10/16/2017   From fall/staples   SHOULDER ARTHROSCOPY Right 03/05/2018   Procedure: ARTHROSCOPY SHOULDER AND OPEN DISTAL CLAVICLE EXCISION;  Surgeon: [name], MD;  Location: [facility];  Service: Orthopedics;  Laterality: Right;   SHOULDER SURGERY     SINUS SURGERY WITH INSTATRAK     TEE WITHOUT CARDIOVERSION N/A 05/09/2017   Procedure: TRANSESOPHAGEAL ECHOCARDIOGRAM (TEE);  Surgeon: [name], MD;  Location[facility];  Service: Cardiovascular;  Laterality: N/A;   TRANSTHORACIC ECHOCARDIOGRAM  02/22/2011   EF 55-65%; increased pattern of LVH with mild conc hypertrophy, abnormal relaxation & increased filling pressure (grade 2 diastolic dysfunction); atrial septum thickened (lipomatous hypertrophy)   UMBILICAL HERNIA REPAIR N/A 07/01/2016   Procedure: UMBILICAL HERNIA REPAIR WITH MESH;  Surgeon: [name], MD;  Location: [facility];  Service: General;  Laterality: N/A;   UPPER GASTROINTESTINAL ENDOSCOPY     VASECTOMY         Family History  Problem Relation Age of Onset    Colon polyps Father    Stroke Father    Hyperlipidemia Father    Hypertension Father    Heart disease Mother        CABG at age 38   Hyperlipidemia Mother    Hypertension Mother    Stroke Paternal Grandmother    Stroke Paternal Grandfather    Other Child        tetrology of fallot (cornealia deland syndrome)   Colon cancer Neg Hx    Esophageal cancer Neg Hx    Stomach cancer Neg Hx    Rectal cancer Neg Hx     Social History   Tobacco Use   Smoking status: Never   Smokeless tobacco: Never  Vaping Use   Vaping Use: Never used  Substance Use Topics  Alcohol use: No   Drug use: No    Home Medications Prior to Admission medications   Medication Sig Start Date End Date Taking? Authorizing Provider  Ascorbic Acid (VITAMIN C PO) Take 1 tablet by mouth daily.    [provider]  Calcium Carbonate-Vitamin D (CALCIUM 600+D PO) Take 1 tablet by mouth 2 (two) times daily.    [provider]  cholecalciferol (VITAMIN D) 1000 units tablet Take 1,000 Units at bedtime by mouth.     [provider]  eletriptan (RELPAX) 20 MG tablet Take 1 tablet (20 mg total) by mouth as needed for migraine or headache. May repeat in 2 hours if headache persists or recurs. 07/12/19   Daleen Squibb, MD  esomeprazole (NEXIUM) 40 MG capsule TAKE 1 CAPSULE TWICE DAILY 10/02/20   Ladene Artist, MD  Eszopiclone 3 MG TABS Take 3 mg by mouth at bedtime. Take immediately before bedtime    [provider]  folic acid (FOLVITE) 1 MG tablet Take 2 tablets (2 mg total) by mouth daily. 01/11/20   Jacelyn Pi, Lilia Argue, MD  glucose blood (CHOICE DM FORA G20 TEST STRIPS) test strip Use as instructed 11/13/12   [name], MD  Ibuprofen-diphenhydrAMINE Cit (ADVIL PM PO) Take 1 tablet by mouth at bedtime.     [provider]  Lactase (DAIRY RELIEF PO) Take 2 tablets by mouth in the morning and at bedtime.    [provider]  lamoTRIgine (LAMICTAL) 150 MG  tablet Take 1 tablet (150 mg total) by mouth 2 (two) times daily. 11/09/20   Lomax, Amy, NP  latanoprost (XALATAN) 0.005 % ophthalmic solution Place 1 drop into both eyes at bedtime.     [provider]  lidocaine (XYLOCAINE) 2 % solution Use as directed 15 mLs in the mouth or throat as needed for mouth pain. 11/23/20   Arnaldo Natal, MD  mercaptopurine (PURINETHOL) 50 MG tablet TAKE 2 TABLETS DAILY ON AN EMPTY STOMACH 1 HOUR BEFOREOR 2 HOURS AFTER A MEAL.   CAUTION: CHEMOTHERAPY 10/02/20   Ladene Artist, MD  mesalamine (LIALDA) 1.2 g EC tablet TAKE 2 TABLETS DAILY WITH  BREAKFAST 10/02/20   Ladene Artist, MD  methscopolamine (PAMINE FORTE) 5 MG tablet TAKE 1 TABLET TWICE A DAY 10/02/20   Ladene Artist, MD  ONE TOUCH ULTRA TEST test strip USE AS INSTRUCTED    Weber, Damaris Hippo, PA-C  oxyCODONE (ROXICODONE) 5 MG immediate release tablet Take 1 tablet (5 mg total) by mouth every 6 (six) hours as needed for up to 10 doses. 11/25/20   Curatolo, Adam, DO  potassium chloride SA (KLOR-CON) 20 MEQ tablet Take 1 tablet (20 mEq total) by mouth 2 (two) times daily. 01/11/20   Daleen Squibb, MD  Probiotic Product (PROBIOTIC PO) Take 1 tablet at bedtime by mouth.     [provider]  sertraline (ZOLOFT) 100 MG tablet Take 200 mg daily with breakfast by mouth.  03/16/17   [provider]  traZODone (DESYREL) 150 MG tablet Take 150 mg by mouth at bedtime.    [provider]  XIFAXAN 550 MG TABS tablet TAKE 1 TABLET TWICE A DAY 10/02/20   Ladene Artist, MD    Allergies    Azithromycin, Beef-derived products, Milk-related compounds, Penicillins, Sulfonamide derivatives, and Dilaudid [hydromorphone hcl]  Review of Systems   Review of Systems 10 systems reviewed and negative except as per HPI Physical Exam Updated Vital Signs  BP (!) 165/90   Pulse 99   Temp (!) 101.6 F (38.7 C) (Oral)   Resp 18   Ht 5' 11"  (1.803 m)   Wt 81.6 kg   SpO2 96%   BMI 25.09 kg/m    Physical Exam Constitutional:      Comments: Patient is slightly tremulous and confused in appearance.  No respiratory distress.  He is answering questions.  HENT:     Head:     Comments: Patient has multiple squamous cell tumors on his face he has had multiple Mohs procedures.    Nose: Nose normal.     Mouth/Throat:     Mouth: Mucous membranes are dry.     Comments: Posterior oropharynx has some diffuse erythema.  No signs of any posterior obstruction or asymmetric lesions. Eyes:     Extraocular Movements: Extraocular movements intact.     Pupils: Pupils are equal, round, and reactive to light.  Neck:     Comments: No focal lymphadenopathy.  Some potential discomfort along the mandible on the right but no evident large swelling or nodule Cardiovascular:     Comments: Tachycardia no rub murmur gallop Pulmonary:     Effort: Pulmonary effort is normal.     Breath sounds: Normal breath sounds.  Abdominal:     Comments: Patient endorses diffuse abdominal tenderness without guarding.  No masses or fullness in the groin.  Musculoskeletal:        General: No swelling or tenderness. Normal range of motion.     Right lower leg: No edema.     Left lower leg: No edema.     Comments: Lower extremities are in good condition.  No peripheral edema.  Calves are soft and nontender.  Feet warm and dry without lesions  Skin:    General: Skin is warm and dry.     Comments: Patient is hot to the touch.  Neurological:     Comments: Patient seems moderately confused.  He is answering some questions appropriately but then becomes confused.  He is slightly tremulous and exhibits some symptoms of delirium.  No Focal motor deficits    ED Results / Procedures / Treatments   Labs (all labs ordered are listed, but only abnormal results are displayed) Labs Reviewed  COMPREHENSIVE METABOLIC PANEL - Abnormal; Notable for the following components:      Result Value   Glucose, Bld 134 (*)    Calcium 8.7  (*)    Total Bilirubin 3.1 (*)    All other components within normal limits  CBC WITH DIFFERENTIAL/PLATELET - Abnormal; Notable for the following components:   RBC 3.14 (*)    Hemoglobin 12.0 (*)    HCT 33.0 (*)    MCV 105.1 (*)    MCH 38.2 (*)    MCHC 36.4 (*)    Platelets 123 (*)    Lymphs Abs 0.5 (*)    All other components within normal limits  PROTIME-INR - Abnormal; Notable for the following components:   Prothrombin Time 16.1 (*)    INR 1.3 (*)    All other components within normal limits  URINALYSIS, ROUTINE W REFLEX MICROSCOPIC - Abnormal; Notable for the following components:   Hgb urine dipstick LARGE (*)    Protein, ur 30 (*)    All other components within normal limits  AMMONIA - Abnormal; Notable for the following components:   Ammonia 62 (*)    All other components within normal limits  MAGNESIUM - Abnormal; Notable for the  following components:   Magnesium 1.5 (*)    All other components within normal limits  PHOSPHORUS - Abnormal; Notable for the following components:   Phosphorus 1.9 (*)    All other components within normal limits  CULTURE, BLOOD (ROUTINE X 2)  CULTURE, BLOOD (ROUTINE X 2)  RESP PANEL BY RT-PCR (FLU A&B, COVID) ARPGX2  LACTIC ACID, PLASMA  LACTIC ACID, PLASMA  RAPID URINE DRUG SCREEN, HOSP PERFORMED    EKG EKG Interpretation  Date/Time:  Monday December 04 2020 19:50:31 EDT Ventricular Rate:  106 PR Interval:  198 QRS Duration: 94 QT Interval:  332 QTC Calculation: 441 R Axis:   -20 Text Interpretation: Sinus tachycardia Borderline prolonged PR interval Probable left atrial enlargement Left ventricular hypertrophy Inferior infarct, old increased rate otherwise similar to previous Confirmed by Charlesetta Shanks [phone]) on 12/04/2020 10:11:26 PM  Radiology CT Head Wo Contrast  Result Date: 12/04/2020 CLINICAL DATA:  Acute onset altered mental status. EXAM: CT HEAD WITHOUT CONTRAST TECHNIQUE: Contiguous axial images were obtained from the  base of the skull through the vertex without intravenous contrast. COMPARISON:  09/08/2020 FINDINGS: Brain: No evidence of acute infarction, hemorrhage, hydrocephalus, extra-axial collection or mass lesion/mass effect. Mild atrophy most prominent in the frontal regions. Vascular: Mild intracranial arterial vascular calcifications. Skull: Calvarium appears intact. Sinuses/Orbits: Paranasal sinuses and mastoid air cells are clear. Other: No significant changes since previous study. IMPRESSION: No acute intracranial abnormalities.  Mild cerebral atrophy. Electronically Signed   By: Lucienne Capers M.D.   On: 12/04/2020 21:00   DG Chest Port 1 View  Result Date: 12/04/2020 CLINICAL DATA:  Right-sided facial pain.  Febrile. EXAM: PORTABLE CHEST 1 VIEW COMPARISON:  09/08/2020 FINDINGS: Shallow inspiration with elevation of the right hemidiaphragm. Heart size and pulmonary vascularity are normal for technique. No airspace disease or consolidation in the lungs. No pleural effusions. No pneumothorax. Multiple old appearing right rib fractures. Postoperative changes in the distal right clavicle. Calcification of the aorta. Degenerative changes in the spine and shoulders. IMPRESSION: Shallow inspiration.  No evidence of active pulmonary disease. Electronically Signed   By: Lucienne Capers M.D.   On: 12/04/2020 20:44    Procedures Procedures  CRITICAL CARE Performed by: Charlesetta Shanks   Total critical care time: 30 minutes  Critical care time was exclusive of separately billable procedures and treating other patients.  Critical care was necessary to treat or prevent imminent or life-threatening deterioration.  Critical care was time spent personally by me on the following activities: development of treatment plan with patient and/or surrogate as well as nursing, discussions with consultants, evaluation of patient's response to treatment, examination of patient, obtaining history from patient or surrogate,  ordering and performing treatments and interventions, ordering and review of laboratory studies, ordering and review of radiographic studies, pulse oximetry and re-evaluation of patient's condition.  Medications Ordered in ED Medications  lactated ringers infusion (has no administration in time range)  lactated ringers bolus 1,000 mL (0 mLs Intravenous Stopped 12/04/20 2203)    And  lactated ringers bolus 1,000 mL (1,000 mLs Intravenous New Bag/Given 12/04/20 2211)    And  lactated ringers bolus 500 mL (has no administration in time range)  vancomycin (VANCOCIN) IVPB 1000 mg/200 mL premix (1,000 mg Intravenous New Bag/Given 12/04/20 2238)  vancomycin (VANCOCIN) IVPB 1000 mg/200 mL premix (has no administration in time range)  vancomycin (VANCOCIN) IVPB 1000 mg/200 mL premix (has no administration in time range)  HYDROmorphone (DILAUDID) injection 1 mg (has no administration in time  range)  ondansetron (ZOFRAN) injection 4 mg (has no administration in time range)  ketorolac (TORADOL) 30 MG/ML injection 30 mg (has no administration in time range)  acetaminophen (TYLENOL) tablet 650 mg (650 mg Oral Given 12/04/20 2003)  ceFEPIme (MAXIPIME) 2 g in sodium chloride 0.9 % 100 mL IVPB (0 g Intravenous Stopped 12/04/20 2235)  metroNIDAZOLE (FLAGYL) IVPB 500 mg (0 mg Intravenous Stopped 12/04/20 2237)    ED Course  I have reviewed the triage vital signs and the nursing notes.  Pertinent labs & imaging results that were available during my care of the patient were reviewed by me and considered in my medical decision making (see chart for details).    MDM Rules/Calculators/A&P                         As outlined with indolent course of facial pain and fever.  At this time, patient does not have any signs of airway obstruction.  No focal abscess identified on the face.  He has some tenderness along the jawline.  Patient has been exhibiting some increased confusion per the patient's wife.  She does describe  that at baseline he has some cognitive issues but he is alert and speech is clear and appropriate.  He does not seem significantly confused at this time.  With progressive symptoms and now fever with tachycardia and SIRS criteria, will admit with antibiotics for observation and further diagnostic work-up as indicated.  Consult: Reviewed with Dr. Marlowe Sax for admission Final Clinical Impression(s) / ED Diagnoses Final diagnoses:  SIRS (systemic inflammatory response syndrome) ([place])  Fever, unspecified fever cause  Facial pain    Rx / DC Orders ED Discharge Orders     None        Charlesetta Shanks, MD 12/04/20 2250

## 2020-12-04 NOTE — Plan of Care (Signed)
[facility] will assume care on arrival to accepting facility. Until arrival, care as per EDP. However, [facility] available 24/7 for questions and assistance.

## 2020-12-05 ENCOUNTER — Inpatient Hospital Stay (HOSPITAL_COMMUNITY): Payer: 59

## 2020-12-05 ENCOUNTER — Encounter (HOSPITAL_COMMUNITY): Payer: Self-pay | Admitting: Internal Medicine

## 2020-12-05 DIAGNOSIS — F32A Depression, unspecified: Secondary | ICD-10-CM | POA: Diagnosis present

## 2020-12-05 DIAGNOSIS — M272 Inflammatory conditions of jaws: Secondary | ICD-10-CM | POA: Diagnosis present

## 2020-12-05 DIAGNOSIS — G4733 Obstructive sleep apnea (adult) (pediatric): Secondary | ICD-10-CM | POA: Diagnosis present

## 2020-12-05 DIAGNOSIS — G40209 Localization-related (focal) (partial) symptomatic epilepsy and epileptic syndromes with complex partial seizures, not intractable, without status epilepticus: Secondary | ICD-10-CM | POA: Diagnosis present

## 2020-12-05 DIAGNOSIS — F0391 Unspecified dementia with behavioral disturbance: Secondary | ICD-10-CM | POA: Diagnosis present

## 2020-12-05 DIAGNOSIS — K219 Gastro-esophageal reflux disease without esophagitis: Secondary | ICD-10-CM | POA: Diagnosis present

## 2020-12-05 DIAGNOSIS — E1169 Type 2 diabetes mellitus with other specified complication: Secondary | ICD-10-CM | POA: Diagnosis present

## 2020-12-05 DIAGNOSIS — R413 Other amnesia: Secondary | ICD-10-CM | POA: Diagnosis not present

## 2020-12-05 DIAGNOSIS — E119 Type 2 diabetes mellitus without complications: Secondary | ICD-10-CM | POA: Diagnosis not present

## 2020-12-05 DIAGNOSIS — L03211 Cellulitis of face: Secondary | ICD-10-CM | POA: Diagnosis present

## 2020-12-05 DIAGNOSIS — D696 Thrombocytopenia, unspecified: Secondary | ICD-10-CM | POA: Diagnosis present

## 2020-12-05 DIAGNOSIS — D539 Nutritional anemia, unspecified: Secondary | ICD-10-CM | POA: Diagnosis present

## 2020-12-05 DIAGNOSIS — K0889 Other specified disorders of teeth and supporting structures: Secondary | ICD-10-CM | POA: Diagnosis present

## 2020-12-05 DIAGNOSIS — D849 Immunodeficiency, unspecified: Secondary | ICD-10-CM | POA: Diagnosis present

## 2020-12-05 DIAGNOSIS — H532 Diplopia: Secondary | ICD-10-CM | POA: Diagnosis present

## 2020-12-05 DIAGNOSIS — I10 Essential (primary) hypertension: Secondary | ICD-10-CM | POA: Diagnosis present

## 2020-12-05 DIAGNOSIS — M869 Osteomyelitis, unspecified: Secondary | ICD-10-CM | POA: Diagnosis present

## 2020-12-05 DIAGNOSIS — G3184 Mild cognitive impairment, so stated: Secondary | ICD-10-CM | POA: Diagnosis present

## 2020-12-05 DIAGNOSIS — R070 Pain in throat: Secondary | ICD-10-CM | POA: Diagnosis not present

## 2020-12-05 DIAGNOSIS — R6884 Jaw pain: Secondary | ICD-10-CM | POA: Diagnosis not present

## 2020-12-05 DIAGNOSIS — K5080 Crohn's disease of both small and large intestine without complications: Secondary | ICD-10-CM | POA: Diagnosis present

## 2020-12-05 DIAGNOSIS — F419 Anxiety disorder, unspecified: Secondary | ICD-10-CM | POA: Diagnosis present

## 2020-12-05 DIAGNOSIS — D638 Anemia in other chronic diseases classified elsewhere: Secondary | ICD-10-CM | POA: Diagnosis present

## 2020-12-05 DIAGNOSIS — Z20822 Contact with and (suspected) exposure to covid-19: Secondary | ICD-10-CM | POA: Diagnosis present

## 2020-12-05 DIAGNOSIS — R569 Unspecified convulsions: Secondary | ICD-10-CM | POA: Diagnosis not present

## 2020-12-05 DIAGNOSIS — K7460 Unspecified cirrhosis of liver: Secondary | ICD-10-CM | POA: Diagnosis present

## 2020-12-05 DIAGNOSIS — R519 Headache, unspecified: Secondary | ICD-10-CM | POA: Diagnosis present

## 2020-12-05 DIAGNOSIS — E785 Hyperlipidemia, unspecified: Secondary | ICD-10-CM | POA: Diagnosis present

## 2020-12-05 DIAGNOSIS — K7581 Nonalcoholic steatohepatitis (NASH): Secondary | ICD-10-CM | POA: Diagnosis present

## 2020-12-05 DIAGNOSIS — R1310 Dysphagia, unspecified: Secondary | ICD-10-CM | POA: Diagnosis present

## 2020-12-05 LAB — SEDIMENTATION RATE: Sed Rate: 27 mm/hr — ABNORMAL HIGH (ref 0–16)

## 2020-12-05 LAB — C-REACTIVE PROTEIN: CRP: 9.8 mg/dL — ABNORMAL HIGH (ref <1.0)

## 2020-12-05 LAB — RESP PANEL BY RT-PCR (FLU A&B, COVID) ARPGX2
Influenza A by PCR: NEGATIVE
Influenza B by PCR: NEGATIVE
SARS Coronavirus 2 by RT PCR: NEGATIVE

## 2020-12-05 LAB — HIV ANTIBODY (ROUTINE TESTING W REFLEX): HIV Screen 4th Generation wRfx: NONREACTIVE

## 2020-12-05 MED ORDER — ENOXAPARIN SODIUM 40 MG/0.4ML IJ SOSY
40.0000 mg | PREFILLED_SYRINGE | INTRAMUSCULAR | Status: DC
  Administered 2020-12-05 – 2020-12-11 (×6): 40 mg via SUBCUTANEOUS
  Filled 2020-12-05 (×6): qty 0.4

## 2020-12-05 MED ORDER — ZOLPIDEM TARTRATE 5 MG PO TABS
5.0000 mg | ORAL_TABLET | Freq: Every evening | ORAL | Status: DC | PRN
  Administered 2020-12-06 – 2020-12-08 (×2): 5 mg via ORAL
  Filled 2020-12-05 (×2): qty 1

## 2020-12-05 MED ORDER — MESALAMINE 1.2 G PO TBEC
1.2000 g | DELAYED_RELEASE_TABLET | Freq: Two times a day (BID) | ORAL | Status: DC
  Administered 2020-12-05 – 2020-12-11 (×12): 1.2 g via ORAL
  Filled 2020-12-05 (×14): qty 1

## 2020-12-05 MED ORDER — LATANOPROST 0.005 % OP SOLN
1.0000 [drp] | Freq: Every day | OPHTHALMIC | Status: DC
  Administered 2020-12-05 – 2020-12-10 (×6): 1 [drp] via OPHTHALMIC
  Filled 2020-12-05: qty 2.5

## 2020-12-05 MED ORDER — SODIUM CHLORIDE 0.9% FLUSH
3.0000 mL | Freq: Two times a day (BID) | INTRAVENOUS | Status: DC
  Administered 2020-12-05 – 2020-12-11 (×7): 3 mL via INTRAVENOUS

## 2020-12-05 MED ORDER — SERTRALINE HCL 100 MG PO TABS
200.0000 mg | ORAL_TABLET | Freq: Every day | ORAL | Status: DC
  Administered 2020-12-05 – 2020-12-11 (×7): 200 mg via ORAL
  Filled 2020-12-05 (×7): qty 2

## 2020-12-05 MED ORDER — HYDROCODONE-ACETAMINOPHEN 5-325 MG PO TABS
1.0000 | ORAL_TABLET | ORAL | Status: DC | PRN
  Administered 2020-12-05: 2 via ORAL
  Administered 2020-12-05: 1 via ORAL
  Administered 2020-12-06: 2 via ORAL
  Administered 2020-12-06 – 2020-12-09 (×4): 1 via ORAL
  Administered 2020-12-09 – 2020-12-11 (×5): 2 via ORAL
  Filled 2020-12-05 (×3): qty 2
  Filled 2020-12-05: qty 1
  Filled 2020-12-05 (×2): qty 2
  Filled 2020-12-05 (×3): qty 1
  Filled 2020-12-05 (×2): qty 2
  Filled 2020-12-05: qty 1

## 2020-12-05 MED ORDER — TRAZODONE HCL 150 MG PO TABS
150.0000 mg | ORAL_TABLET | Freq: Every day | ORAL | Status: DC
  Administered 2020-12-05 – 2020-12-10 (×6): 150 mg via ORAL
  Filled 2020-12-05 (×6): qty 1

## 2020-12-05 MED ORDER — MORPHINE SULFATE (PF) 2 MG/ML IV SOLN
2.0000 mg | INTRAVENOUS | Status: DC | PRN
Start: 2020-12-05 — End: 2020-12-11
  Administered 2020-12-05 – 2020-12-08 (×6): 2 mg via INTRAVENOUS
  Filled 2020-12-05 (×6): qty 1

## 2020-12-05 MED ORDER — BISACODYL 5 MG PO TBEC: 5.0000 mg | DELAYED_RELEASE_TABLET | Freq: Every day | ORAL | Status: DC | PRN

## 2020-12-05 MED ORDER — MAGNESIUM SULFATE 4 GM/100ML IV SOLN
4.0000 g | Freq: Once | INTRAVENOUS | Status: AC
  Administered 2020-12-05: 4 g via INTRAVENOUS
  Filled 2020-12-05: qty 100

## 2020-12-05 MED ORDER — IOHEXOL 300 MG/ML  SOLN
100.0000 mL | Freq: Once | INTRAMUSCULAR | Status: AC | PRN
  Administered 2020-12-05: 100 mL via INTRAVENOUS

## 2020-12-05 MED ORDER — OXYCODONE HCL 5 MG PO TABS
10.0000 mg | ORAL_TABLET | ORAL | Status: AC
  Administered 2020-12-05: 10 mg via ORAL
  Filled 2020-12-05: qty 2

## 2020-12-05 MED ORDER — KETOROLAC TROMETHAMINE 30 MG/ML IJ SOLN
30.0000 mg | Freq: Once | INTRAMUSCULAR | Status: AC
  Administered 2020-12-05: 30 mg via INTRAVENOUS
  Filled 2020-12-05: qty 1

## 2020-12-05 MED ORDER — VANCOMYCIN HCL 1250 MG/250ML IV SOLN
1250.0000 mg | Freq: Two times a day (BID) | INTRAVENOUS | Status: DC
  Administered 2020-12-05 – 2020-12-08 (×6): 1250 mg via INTRAVENOUS
  Filled 2020-12-05 (×7): qty 250

## 2020-12-05 MED ORDER — LACTATED RINGERS IV SOLN: INTRAVENOUS | Status: DC

## 2020-12-05 MED ORDER — SODIUM CHLORIDE 0.9 % IV SOLN
2.0000 g | Freq: Three times a day (TID) | INTRAVENOUS | Status: DC
  Administered 2020-12-05 – 2020-12-08 (×10): 2 g via INTRAVENOUS
  Filled 2020-12-05 (×10): qty 2

## 2020-12-05 MED ORDER — LAMOTRIGINE 25 MG PO TABS
150.0000 mg | ORAL_TABLET | Freq: Two times a day (BID) | ORAL | Status: DC
  Administered 2020-12-05 – 2020-12-11 (×12): 150 mg via ORAL
  Filled 2020-12-05 (×12): qty 1

## 2020-12-05 MED ORDER — NIACINAMIDE 100 MG PO TABS: 100.0000 mg | ORAL_TABLET | Freq: Two times a day (BID) | ORAL | Status: DC

## 2020-12-05 MED ORDER — RIFAXIMIN 550 MG PO TABS
550.0000 mg | ORAL_TABLET | Freq: Two times a day (BID) | ORAL | Status: DC
  Administered 2020-12-05 – 2020-12-11 (×12): 550 mg via ORAL
  Filled 2020-12-05 (×12): qty 1

## 2020-12-05 MED ORDER — METHSCOPOLAMINE BROMIDE 5 MG PO TABS
5.0000 mg | ORAL_TABLET | Freq: Two times a day (BID) | ORAL | Status: DC
  Administered 2020-12-05 – 2020-12-11 (×13): 5 mg via ORAL
  Filled 2020-12-05 (×13): qty 1

## 2020-12-05 MED ORDER — ACETAMINOPHEN 650 MG RE SUPP: 650.0000 mg | Freq: Four times a day (QID) | RECTAL | Status: DC | PRN

## 2020-12-05 MED ORDER — ACETAMINOPHEN 325 MG PO TABS: 650.0000 mg | ORAL_TABLET | Freq: Four times a day (QID) | ORAL | Status: DC | PRN

## 2020-12-05 MED ORDER — GABAPENTIN 300 MG PO CAPS
300.0000 mg | ORAL_CAPSULE | Freq: Every day | ORAL | Status: DC
  Administered 2020-12-05 – 2020-12-11 (×7): 300 mg via ORAL
  Filled 2020-12-05 (×7): qty 1

## 2020-12-05 MED ORDER — POLYETHYLENE GLYCOL 3350 17 G PO PACK: 17.0000 g | PACK | Freq: Every day | ORAL | Status: DC | PRN

## 2020-12-05 MED ORDER — POTASSIUM PHOSPHATES 15 MMOLE/5ML IV SOLN
10.0000 mmol | Freq: Once | INTRAVENOUS | Status: AC
  Administered 2020-12-05: 10 mmol via INTRAVENOUS
  Filled 2020-12-05: qty 3.33

## 2020-12-05 MED ORDER — FOLIC ACID 1 MG PO TABS
2.0000 mg | ORAL_TABLET | Freq: Every day | ORAL | Status: DC
  Administered 2020-12-05 – 2020-12-11 (×7): 2 mg via ORAL
  Filled 2020-12-05 (×7): qty 2

## 2020-12-05 MED ORDER — DOCUSATE SODIUM 100 MG PO CAPS
100.0000 mg | ORAL_CAPSULE | Freq: Two times a day (BID) | ORAL | Status: DC
  Administered 2020-12-05 – 2020-12-11 (×10): 100 mg via ORAL
  Filled 2020-12-05 (×11): qty 1

## 2020-12-05 MED ORDER — ONDANSETRON HCL 4 MG PO TABS
4.0000 mg | ORAL_TABLET | Freq: Four times a day (QID) | ORAL | Status: DC | PRN
Start: 2020-12-05 — End: 2020-12-11

## 2020-12-05 MED ORDER — ONDANSETRON HCL 4 MG/2ML IJ SOLN: 4.0000 mg | Freq: Four times a day (QID) | INTRAMUSCULAR | Status: DC | PRN

## 2020-12-05 MED ORDER — PANTOPRAZOLE SODIUM 40 MG PO TBEC
40.0000 mg | DELAYED_RELEASE_TABLET | Freq: Two times a day (BID) | ORAL | Status: DC
  Administered 2020-12-05 – 2020-12-11 (×12): 40 mg via ORAL
  Filled 2020-12-05 (×12): qty 1

## 2020-12-05 MED ORDER — HYDRALAZINE HCL 20 MG/ML IJ SOLN: 5.0000 mg | INTRAMUSCULAR | Status: DC | PRN

## 2020-12-05 NOTE — Progress Notes (Signed)
Pharmacy Antibiotic Note  [name] [name] is a 60 y.o. male admitted on 12/04/2020 with sepsis.  Pharmacy has been consulted for Vancomycin and Cefepime dosing.  Patient endorses R sided facial pain, started as jaw pain over the past few days. S/p clindamycin (completed 6/12) for sore throat.  No abscess per dentist, endodontist saw spot on the L side w/ pus (however, not where his pain is). WBC wnl, Tm 99.  Plan: S/p vancomycin 2072m IV x1 loading dose Adjust vancomycin to 1250 mg IV q12 hours (eAUC 461, Scr 0.8) Cefepime 2g q8hr IV Follow up clinical status, renal function, vanc levels as indicated   Height: 5' 11"  (180.3 cm) Weight: 81.6 kg (179 lb 14.3 oz) IBW/kg (Calculated) : 75.3  Temp (24hrs), Avg:100.4 F (38 C), Min:99.1 F (37.3 C), Max:102.6 F (39.2 C)  Recent Labs  Lab 12/04/20 1958 12/04/20 2211  WBC 8.5  --   CREATININE 0.73  --   LATICACIDVEN 1.1 0.8     Estimated Creatinine Clearance: 105.9 mL/min (by C-G formula based on SCr of 0.73 mg/dL).    Allergies  Allergen Reactions   Azithromycin Swelling    SWELLING REACTION UNSPECIFIED    Beef-Derived Products Diarrhea    RED MEAT > UNSPECIFIED REACTION    Milk-Related Compounds Diarrhea    DAIRY >UNSPECIFIED REACTION    Penicillins Hives    Rash as child- tolerates Ancef/CHILDHOOD ALLERGY Has patient had a PCN reaction causing immediate rash, facial/tongue/throat swelling, SOB or lightheadedness with hypotension: YES Has patient had a PCN reaction causing severe rash involving mucus membranes or skin necrosis: Unknown Has patient had a PCN reaction that required hospitalization: Unknown Has patient had a PCN reaction occurring within the last 10 years:Unknown If all of the above answers are "NO", then may proceed with Cephalosporin [place]  Sulfonamide Derivatives Hives   Dilaudid [Hydromorphone Hcl] Nausea And Vomiting    Antimicrobials this admission: Vanc 6/13 >> Cefepime 6/13 >>  Microbiology  results: 6/13 Bcx: pend  Thank you for allowing pharmacy to be a part of this patient's care.  MDimple Nanas PharmD PGY-1 Acute Care Pharmacy Resident 12/05/2020 12:55 PM

## 2020-12-05 NOTE — Plan of Care (Signed)

## 2020-12-05 NOTE — Telephone Encounter (Signed)
He is scheduled 12/06/20.

## 2020-12-05 NOTE — H&P (Signed)
History and Physical    [name] [name] GNO:[id number] DOB: May 19, 1961 DOA: 12/04/2020  PCP: [name], MD Consultants:  Krista Blue - neurology; Bobbie Stack - psychiatry Patient coming from:  Home - lives with wife and 59yo special needs child; NOK: Wife, [phone]  Chief Complaint: R facial pain  HPI: [name] [name] is a 60 y.o. male with medical history significant of seizure d/o with mild cognitive impairment; NASH cirrhosis; Crohn's; depression/anxiety; DM; HTN; HLD; and OSA on CPAP presenting with R facial pain.   He was seen in the ER on 6/2 for dysphagia x 3 days with sore throat; CT was negative for deep space abscess and he was treated with Clinda and viscous lidocaine.  He returned on 6/4 for ongoing dental pain and sore throat; he was given narcotics and Decadron with plan for dental f/u within a few days.   Per family, he was seen by the dentist and there was no apparent abscess, also saw endodontist 8 days ago and there was nothing obvious other than a spot on the left side with pus (still present, but this is not where his pain is).  He had a panorex and did not hear results about that.  He has ongoing R-sided facial pain with a "knot" and fever to 102.6.   He has had significant ongoing pain and has been taking oxy.  He saw Dr. Orland Mustard on Friday and also scheduled an appointment with the neurologist for tomorrow - endodontist thought it might be trigeminal neuralgia. He started worsening over the weekend with pain - called in gabapentin as a bridge to neurology appt.  He took 2 doses Sunday and yesterday.  He got more confused yesterday which they thought was related to the 2 medications.  He got more incoherent through the afternoon and had mentioned facial swelling.  His wife called the PCP about 5pm.  She noticed R facial swelling, difficulty speaking, trouble staying awake and so she took him to the ER.  At the ER, his fever was 103.  His BP was elevated.  He has had some leg  jerking.  He says his pain is worsening - R facial pain with radiation into the R eye and temporal area, new facial edema around the mouth.  He finished Clinda on Sunday AM.  +facial numbness.  Complaining of mildly worsening vision in his R eye with diplopia.      ED Course: [facility] to [facility] transfer, per Dr. Marlowe Sax:  Right-sided facial pain around his jawline.  He was seen in the ED earlier this month for the same issue and CT soft tissue done at that time was negative.  In the ED, he is febrile and tachycardic.  However, labs showing no leukocytosis or lactic acidosis.  Urinalysis is negative.  Chest x-ray negative.  COVID swab not done yet (requested).  He is a poor historian and his behavior is odd.  Not confused or disoriented otherwise.  Per family, he has cognitive/behavioral issues.  Head CT negative.  He is complaining of headaches but has no other meningeal signs.  Imaging of facial soft tissues not repeated as it was done earlier this month and no obvious palpable abscess or abnormality on exam.  Patient was given broad-spectrum antibiotics and IV fluid boluses.  Review of Systems: As per HPI; otherwise review of systems reviewed and negative.   Ambulatory Status:  Ambulates without assistance or with a cane  COVID Vaccine Status:  Complete  Past Medical History:  Diagnosis Date   Adenomatous colon polyp 10/1999   Anxiety    Arthritis    neck, yoga helps.   Cataract    Complex partial seizure ([place])    last seizure was 08-30-2018   Crohn's disease of small and large intestines ([place]) 1999   Depression    Diabetes mellitus    no meds at this time 10-24-17   Esophageal stricture    External hemorrhoids    GERD (gastroesophageal reflux disease)    Glaucoma    History of kidney stones    3 large stones still present   Hyperlipemia    Hypertension    resolved with weight loss    Inguinal hernia, bilateral 12/2019   Liver cirrhosis secondary to NASH ([place])    Migraines     Osteoporosis    PONV (postoperative nausea and vomiting)    PTSD (post-traumatic stress disorder)    Renal cyst, acquired, left 07/08/2017   Skin cancer    squamous cell multiple; [place]; followed every 3 months.   Sleep apnea    CPAP machine, uses nightly   Staphylococcus aureus bacteremia with sepsis ([place]) 05/28/2017   MRSA 2012    Past Surgical History:  Procedure Laterality Date   CATARACT EXTRACTION Bilateral    COLONOSCOPY     ELBOW SURGERY  2012   elbow MRSA infection    EYE SURGERY     cataracts removed, /w IOL   INGUINAL HERNIA REPAIR Right 01/14/2020   Procedure: OPEN RIGHT HERNIA REPAIR INGUINAL WITH MESH;  Surgeon: [name], MD;  Location: [facility];  Service: General;  Laterality: Right;   INSERTION OF MESH N/A 07/01/2016   Procedure: INSERTION OF MESH;  Surgeon: [name], MD;  Location: [place];  Service: General;  Laterality: N/A;   IR URETERAL STENT LEFT NEW ACCESS W/O SEP NEPHROSTOMY CATH  02/02/2018   NEPHROLITHOTOMY Left 02/02/2018   Procedure: NEPHROLITHOTOMY PERCUTANEOUS;  Surgeon: [name], MD;  Location: [facility];  Service: Urology;  Laterality: Left;   POLYPECTOMY     SCALP LACERATION REPAIR Right 10/16/2017   From fall/staples   SHOULDER ARTHROSCOPY Right 03/05/2018   Procedure: ARTHROSCOPY SHOULDER AND OPEN DISTAL CLAVICLE EXCISION;  Surgeon: [name], MD;  Location: [place];  Service: Orthopedics;  Laterality: Right;   SHOULDER SURGERY     SINUS SURGERY WITH INSTATRAK     TEE WITHOUT CARDIOVERSION N/A 05/09/2017   Procedure: TRANSESOPHAGEAL ECHOCARDIOGRAM (TEE);  Surgeon: [name], MD;  Location: [facility]COPY;  Service: Cardiovascular;  Laterality: N/A;   TRANSTHORACIC ECHOCARDIOGRAM  02/22/2011   EF 55-65%; increased pattern of LVH with mild conc hypertrophy, abnormal relaxation & increased filling pressure (grade 2 diastolic dysfunction); atrial septum thickened (lipomatous hypertrophy)   UMBILICAL HERNIA REPAIR N/A 07/01/2016    Procedure: UMBILICAL HERNIA REPAIR WITH MESH;  Surgeon: [name], MD;  Location: [place];  Service: General;  Laterality: N/A;   UPPER GASTROINTESTINAL ENDOSCOPY     VASECTOMY      Social History   Socioeconomic History   Marital status: Married    Spouse name: [name]   Number of children: 2   Years of education: college   Highest education level: Not on file  Occupational History   Occupation: disabled    Comment: seizure disorder with memory impairment  Tobacco Use   Smoking status: Never   Smokeless tobacco: Never  Vaping Use   Vaping Use: Never used  Substance and Sexual Activity   Alcohol use: No  Drug use: No   Sexual activity: Yes    Birth control/protection: Surgical    Comment: 1 partner in last 12 months  Other Topics Concern   Not on file  Social History Narrative   Marital status: married x 32 years;       Lives:   lives at home with his family.      Children:  two children; no grandchildren.  Son is special needs at age 18yo; Cornelian Delaine with TOF.       Employment: unemployed; [employer] long term disability      Tobacco: none      Alcohol: none      Drugs: none      Exercise: unable to exercise due to frequent falls   Patient has a college education.   Patient is right-handed.   Patient drinks one cup of soda daily.   Social Determinants of Health   Financial Resource Strain: Not on file  Food Insecurity: Not on file  Transportation Needs: Not on file  Physical Activity: Not on file  Stress: Not on file  Social Connections: Not on file  Intimate Partner Violence: Not on file    Allergies  Allergen Reactions   Azithromycin Swelling    SWELLING REACTION UNSPECIFIED    Beef-Derived Products Diarrhea    RED MEAT > UNSPECIFIED REACTION    Milk-Related Compounds Diarrhea    DAIRY >UNSPECIFIED REACTION    Penicillins Hives    Rash as child- tolerates Ancef/CHILDHOOD ALLERGY Has patient had a PCN reaction causing immediate rash,  facial/tongue/throat swelling, SOB or lightheadedness with hypotension: YES Has patient had a PCN reaction causing severe rash involving mucus membranes or skin necrosis: Unknown Has patient had a PCN reaction that required hospitalization: Unknown Has patient had a PCN reaction occurring within the last 10 years:Unknown If all of the above answers are "NO", then may proceed with Cephalosporin [place]   Sulfonamide Derivatives Hives   Dilaudid [Hydromorphone Hcl] Nausea And Vomiting    Family History  Problem Relation Age of Onset   Colon polyps Father    Stroke Father    Hyperlipidemia Father    Hypertension Father    Heart disease Mother        CABG at age 17   Hyperlipidemia Mother    Hypertension Mother    Stroke Paternal Grandmother    Stroke Paternal Grandfather    Other Child        tetrology of fallot (cornealia deland syndrome)   Colon cancer Neg Hx    Esophageal cancer Neg Hx    Stomach cancer Neg Hx    Rectal cancer Neg Hx     Prior to Admission medications   Medication Sig Start Date End Date Taking? Authorizing Provider  Ascorbic Acid (VITAMIN C PO) Take 1 tablet by mouth daily.    [provider]  Calcium Carbonate-Vitamin D (CALCIUM 600+D PO) Take 1 tablet by mouth 2 (two) times daily.    [provider]  cholecalciferol (VITAMIN D) 1000 units tablet Take 1,000 Units at bedtime by mouth.     [provider]  eletriptan (RELPAX) 20 MG tablet Take 1 tablet (20 mg total) by mouth as needed for migraine or headache. May repeat in 2 hours if headache persists or recurs. 07/12/19   Daleen Squibb, MD  esomeprazole (NEXIUM) 40 MG capsule TAKE 1 CAPSULE TWICE DAILY 10/02/20   Ladene Artist, MD  Eszopiclone 3 MG TABS Take 3 mg  by mouth at bedtime. Take immediately before bedtime    [provider]  folic acid (FOLVITE) 1 MG tablet Take 2 tablets (2 mg total) by mouth daily. 01/11/20   Jacelyn Pi, Lilia Argue, MD  glucose blood (CHOICE DM  FORA G20 TEST STRIPS) test strip Use as instructed 11/13/12   [name], MD  Ibuprofen-diphenhydrAMINE Cit (ADVIL PM PO) Take 1 tablet by mouth at bedtime.     [provider]  Lactase (DAIRY RELIEF PO) Take 2 tablets by mouth in the morning and at bedtime.    [provider]  lamoTRIgine (LAMICTAL) 150 MG tablet Take 1 tablet (150 mg total) by mouth 2 (two) times daily. 11/09/20   Lomax, Amy, NP  latanoprost (XALATAN) 0.005 % ophthalmic solution Place 1 drop into both eyes at bedtime.     [provider]  lidocaine (XYLOCAINE) 2 % solution Use as directed 15 mLs in the mouth or throat as needed for mouth pain. 11/23/20   Arnaldo Natal, MD  mercaptopurine (PURINETHOL) 50 MG tablet TAKE 2 TABLETS DAILY ON AN EMPTY STOMACH 1 HOUR BEFOREOR 2 HOURS AFTER A MEAL.   CAUTION: CHEMOTHERAPY 10/02/20   Ladene Artist, MD  mesalamine (LIALDA) 1.2 g EC tablet TAKE 2 TABLETS DAILY WITH  BREAKFAST 10/02/20   Ladene Artist, MD  methscopolamine (PAMINE FORTE) 5 MG tablet TAKE 1 TABLET TWICE A DAY 10/02/20   Ladene Artist, MD  ONE TOUCH ULTRA TEST test strip USE AS INSTRUCTED    Weber, Damaris Hippo, PA-C  oxyCODONE (ROXICODONE) 5 MG immediate release tablet Take 1 tablet (5 mg total) by mouth every 6 (six) hours as needed for up to 10 doses. 11/25/20   Curatolo, Adam, DO  potassium chloride SA (KLOR-CON) 20 MEQ tablet Take 1 tablet (20 mEq total) by mouth 2 (two) times daily. 01/11/20   Daleen Squibb, MD  Probiotic Product (PROBIOTIC PO) Take 1 tablet at bedtime by mouth.     [provider]  sertraline (ZOLOFT) 100 MG tablet Take 200 mg daily with breakfast by mouth.  03/16/17   [provider]  traZODone (DESYREL) 150 MG tablet Take 150 mg by mouth at bedtime.    [provider]  XIFAXAN 550 MG TABS tablet TAKE 1 TABLET TWICE A DAY 10/02/20   Ladene Artist, MD    Physical Exam: Vitals:   12/05/20 0900 12/05/20 0915 12/05/20 1010 12/05/20 1404  BP:  (!) 144/113 (!) 158/81 (!) 157/75 (!) 160/86  Pulse: 70 71 75 77  Resp: 19 18 18 18   Temp: 99.1 F (37.3 C)  99.4 F (37.4 C) 99.7 F (37.6 C)  TempSrc: Oral  Oral Oral  SpO2: 96% 93% 97% 97%  Weight:      Height:         General:  Appears calm but uncomfortable and is in NAD Eyes:   EOMI, normal lids, iris ENT:  grossly normal hearing, suboptimal dentition; R mandibular fullness without apparent region of fluctuance or apparent skin lesion(s); +R temporal region TTP but he is very tender along entire R face     Neck:  fullness along R mandibular region Cardiovascular:  RRR, no m/r/g. No LE edema.  Respiratory:   CTA bilaterally with no wheezes/rales/rhonchi.  Normal respiratory effort. Abdomen:  soft, NT, ND Skin:  mildly excoriated SCC along R nares (wife reports planning for Moh's), also with a lesion on L neck Musculoskeletal:  grossly normal tone BUE/BLE, good  ROM, no bony abnormality Psychiatric:  grossly normal mood and affect, speech fluent and appropriate, AOx2-3 Neurologic:  CN 2-12 grossly intact, moves all extremities in coordinated fashion    Radiological Exams on Admission: Independently reviewed - see discussion in A/P where applicable  CT Head Wo Contrast  Result Date: 12/04/2020 CLINICAL DATA:  Acute onset altered mental status. EXAM: CT HEAD WITHOUT CONTRAST TECHNIQUE: Contiguous axial images were obtained from the base of the skull through the vertex without intravenous contrast. COMPARISON:  09/08/2020 FINDINGS: Brain: No evidence of acute infarction, hemorrhage, hydrocephalus, extra-axial collection or mass lesion/mass effect. Mild atrophy most prominent in the frontal regions. Vascular: Mild intracranial arterial vascular calcifications. Skull: Calvarium appears intact. Sinuses/Orbits: Paranasal sinuses and mastoid air cells are clear. Other: No significant changes since previous study. IMPRESSION: No acute intracranial abnormalities.  Mild cerebral atrophy.  Electronically Signed   By: Lucienne Capers M.D.   On: 12/04/2020 21:00   CT MAXILLOFACIAL W CONTRAST  Result Date: 12/05/2020 CLINICAL DATA:  The lies of the face. EXAM: CT MAXILLOFACIAL WITH CONTRAST TECHNIQUE: Multidetector CT imaging of the maxillofacial structures was performed with intravenous contrast. Multiplanar CT image reconstructions were also generated. CONTRAST:  132m OMNIPAQUE IOHEXOL 300 MG/ML  SOLN COMPARISON:  None. FINDINGS: Osseous: No fracture or mandibular dislocation. No destructive process. Orbits: Negative. No traumatic or inflammatory finding. Sinuses: Mild scattered ethmoid air cell mucosal thickening. Right frontal sinus osteoma. Remaining sinuses are clear. No mastoid effusions. Soft tissues: Edema and soft tissue thickening overlying the right anterior mandible, compatible with cellulitis/phlegmon. Streak artifact from adjacent dental amalgam limits evaluation without visible drainable fluid collection. Limited intracranial: No obvious acute intracranial abnormality in the visualized brain on limited assessment. IMPRESSION: Edema and soft tissue thickening overlying the right anterior mandible, compatible with cellulitis/phlegmon. Streak artifact from adjacent dental amalgam limits evaluation without visible drainable fluid collection. Electronically Signed   By: FMargaretha SheffieldMD   On: 12/05/2020 14:49   DG Chest Port 1 View  Result Date: 12/04/2020 CLINICAL DATA:  Right-sided facial pain.  Febrile. EXAM: PORTABLE CHEST 1 VIEW COMPARISON:  09/08/2020 FINDINGS: Shallow inspiration with elevation of the right hemidiaphragm. Heart size and pulmonary vascularity are normal for technique. No airspace disease or consolidation in the lungs. No pleural effusions. No pneumothorax. Multiple old appearing right rib fractures. Postoperative changes in the distal right clavicle. Calcification of the aorta. Degenerative changes in the spine and shoulders. IMPRESSION: Shallow  inspiration.  No evidence of active pulmonary disease. Electronically Signed   By: WLucienne CapersM.D.   On: 12/04/2020 20:44    EKG: Independently reviewed.  Sinus tachycardia with rate 106; LVH; nonspecific ST changes with no evidence of acute ischemia   Labs on Admission: I have personally reviewed the available labs and imaging studies at the time of the admission.  Pertinent labs:   Glucose 134 Bili 3.1 WBC 8.5 Hgb 12.0 Platelets 123 Phos 1.9 Mag++ 1.5 NH4 62 Lactate 1.1, 0.8 INR 1.3 UA: large Hgb, 30 protein UDS negative COVID/flu negative ESR 27 CRP 9.8   Assessment/Plan Principal Problem:   Facial cellulitis Active Problems:   Essential hypertension   CROHN'S DISEASE, LARGE AND SMALL INTESTINES   DM2 (diabetes mellitus, type 2) (HCC)   Seizure (HCC)   OSA on CPAP   Memory loss   Facial cellulitis -Patient with 2 prior ER visits, PCP visit, dentist and endodontist visit for the same - facial pain, swelling, fever overnight -Has SIRS criteria but not acute organ dysfunction  suggestive of sepsis -Repeat CT shows edema and soft tissue thickening overlying the R anterior mandible, c/w cellulitis/phlegmon -Consider panorex -Discussed with Dr. Conley Simmonds (oral surgery), who does not see any obvious abscess but he will consult in the AM -Cefepime/Vanc for now -Mildly elevated ESR/CRP with TTP along temporal region but given CT findings at site of patient's main area of discomfort, temporal arteritis seems less likely as diagnosis.  D/W Dr. Oneida Alar, will order temporal artery biopsy and can formally consult if abnormal. -Will admit to telemetry for now for ongoing evaluation and treatment; has already failed outpatient Clindamycin -Continue Neurontin -Hold mercaptopurine in the setting of active infection  Seizure d/o -Continue Lamictal  NASH cirrhosis -Continue Rifaximin -Appears to be compensated at this time  Crohn's -Continue mesalamine,  methscopolamine -Hold mercaptopurine  Cognitive impairment -Likely exacerbated by current illness, fever -Chronically related to seizure d/o, ?dementia -Mildly elevated NH4 but low suspicion for hepatic encephalopathy at this time -Continue eszopiclone (Ambien formulary substitution), Zoloft  DM -Prior A1c was 4.5 -Not on meds -Will follow without intervention for now  HTN -He does not appear to be taking medications for this issue at this time   HLD -He does not appear to be taking medications for this issue at this time   OSA -Hold CPAP given facial swelling     Note: This patient has been tested and is negative for the novel coronavirus COVID-19. The patient has been fully vaccinated against COVID-19.   Level of care: Telemetry Medical DVT prophylaxis: SCDs Code Status:  Full - confirmed with patient/family Family Communication: Wife was present throughout encounter Disposition Plan:  The patient is from: home  Anticipated d/c is to: home without [facility] services   Anticipated d/c date will depend on clinical response to treatment, likely 2-3 days  Patient is currently: acutely ill Consults called: Oral surgery; Vascular surgery (telephone only)  Admission status:  Admit - It is my clinical opinion that admission to INPATIENT is reasonable and necessary because of the expectation that this patient will require hospital care that crosses at least 2 midnights to treat this condition based on the medical complexity of the problems presented.  Given the aforementioned information, the predictability of an adverse outcome is felt to be significant.    Karmen Bongo MD [facility]   How to contact the [facility] Attending or Consulting provider [place] or covering provider during after hours [place], for this patient?  Check the care team in [facility] and look for a) attending/consulting TR[facility] provider listed and b) the [facility] team listed Log into [email or web address] and use [place]'s  universal password to access. If you do not have the password, please contact the hospital operator. Locate the [facility] provider you are looking for under [facility] and page to a number that you can be directly reached. If you still have difficulty reaching the provider, please page the [facility] (Director on Call) for the Hospitalists listed on amion for assistance.   12/05/2020, 6:41 PM

## 2020-12-06 ENCOUNTER — Telehealth: Payer: Self-pay | Admitting: Family Medicine

## 2020-12-06 ENCOUNTER — Institutional Professional Consult (permissible substitution): Payer: Self-pay | Admitting: Neurology

## 2020-12-06 ENCOUNTER — Inpatient Hospital Stay (HOSPITAL_COMMUNITY): Payer: 59

## 2020-12-06 DIAGNOSIS — G4733 Obstructive sleep apnea (adult) (pediatric): Secondary | ICD-10-CM

## 2020-12-06 DIAGNOSIS — R413 Other amnesia: Secondary | ICD-10-CM

## 2020-12-06 DIAGNOSIS — R569 Unspecified convulsions: Secondary | ICD-10-CM

## 2020-12-06 DIAGNOSIS — Z9989 Dependence on other enabling machines and devices: Secondary | ICD-10-CM

## 2020-12-06 DIAGNOSIS — R070 Pain in throat: Secondary | ICD-10-CM | POA: Diagnosis not present

## 2020-12-06 DIAGNOSIS — R6884 Jaw pain: Secondary | ICD-10-CM | POA: Diagnosis not present

## 2020-12-06 LAB — BLOOD CULTURE ID PANEL (REFLEXED) - BCID2

## 2020-12-06 LAB — BASIC METABOLIC PANEL
Anion gap: 9 (ref 5–15)
BUN: 11 mg/dL (ref 6–20)
CO2: 22 mmol/L (ref 22–32)
Calcium: 8.2 mg/dL — ABNORMAL LOW (ref 8.9–10.3)
Chloride: 107 mmol/L (ref 98–111)
Creatinine, Ser: 0.72 mg/dL (ref 0.61–1.24)
GFR, Estimated: >60 mL/min (ref >60)
Glucose, Bld: 77 mg/dL (ref 70–99)
Potassium: 3.6 mmol/L (ref 3.5–5.1)
Sodium: 138 mmol/L (ref 135–145)

## 2020-12-06 LAB — CBC
HCT: 29.2 % — ABNORMAL LOW (ref 39.0–52.0)
Hemoglobin: 10.5 g/dL — ABNORMAL LOW (ref 13.0–17.0)
MCH: 38.6 pg — ABNORMAL HIGH (ref 26.0–34.0)
MCHC: 36 g/dL (ref 30.0–36.0)
MCV: 107.4 fL — ABNORMAL HIGH (ref 80.0–100.0)
Platelets: 99 10*3/uL — ABNORMAL LOW (ref 150–400)
RBC: 2.72 MIL/uL — ABNORMAL LOW (ref 4.22–5.81)
RDW: 14.6 % (ref 11.5–15.5)
WBC: 7.2 10*3/uL (ref 4.0–10.5)
nRBC: 0 % (ref 0.0–0.2)

## 2020-12-06 LAB — MAGNESIUM: Magnesium: 2 mg/dL (ref 1.7–2.4)

## 2020-12-06 MED ORDER — CLOBETASOL PROPIONATE 0.05 % EX OINT
TOPICAL_OINTMENT | Freq: Two times a day (BID) | CUTANEOUS | Status: DC
  Administered 2020-12-06 – 2020-12-09 (×3): 1 via TOPICAL
  Filled 2020-12-06 (×2): qty 15

## 2020-12-06 MED ORDER — CHLORHEXIDINE GLUCONATE 0.12 % MT SOLN
15.0000 mL | Freq: Four times a day (QID) | OROMUCOSAL | Status: DC
  Administered 2020-12-06 – 2020-12-11 (×19): 15 mL via OROMUCOSAL
  Filled 2020-12-06 (×19): qty 15

## 2020-12-06 NOTE — Plan of Care (Signed)
  Problem: Education: Goal: Knowledge of General Education information will improve Description: Including pain rating scale, medication(s)/side effects and non-pharmacologic comfort measures Outcome: Progressing   Problem: Activity: Goal: Risk for activity intolerance will decrease Outcome: Progressing   Problem: Elimination: Goal: Will not experience complications related to urinary retention Outcome: Progressing   Problem: Pain Managment: Goal: General experience of comfort will improve Outcome: Progressing

## 2020-12-06 NOTE — Progress Notes (Signed)
PHARMACY - PHYSICIAN COMMUNICATION CRITICAL VALUE ALERT - BLOOD CULTURE IDENTIFICATION (BCID)  [name] [name] is an 61 y.o. male who presented to [facility] on 12/04/2020 with a chief complaint of facial pain.  Assessment:  Started on broad-spectrum ABX for cellulitis, now growing strep spp in 1 of 4 blood cx bottles, likely a contaminant.  Name of physician (or Provider) Contacted: MDenny FNP  Current antibiotics: vancomycin and cefepime  Changes to prescribed antibiotics recommended:  No changes for now  Results for orders placed or performed during the hospital encounter of 12/04/20  Blood Culture ID Panel (Reflexed) (Collected: 12/04/2020  8:00 PM)  Result Value Ref Range   Enterococcus faecalis NOT DETECTED NOT DETECTED   Enterococcus Faecium NOT DETECTED NOT DETECTED   Listeria monocytogenes NOT DETECTED NOT DETECTED   Staphylococcus species NOT DETECTED NOT DETECTED   Staphylococcus aureus (BCID) NOT DETECTED NOT DETECTED   Staphylococcus epidermidis NOT DETECTED NOT DETECTED   Staphylococcus lugdunensis NOT DETECTED NOT DETECTED   Streptococcus species DETECTED (A) NOT DETECTED   Streptococcus agalactiae NOT DETECTED NOT DETECTED   Streptococcus pneumoniae NOT DETECTED NOT DETECTED   Streptococcus pyogenes NOT DETECTED NOT DETECTED   A.calcoaceticus-baumannii NOT DETECTED NOT DETECTED   Bacteroides fragilis NOT DETECTED NOT DETECTED   Enterobacterales NOT DETECTED NOT DETECTED   Enterobacter cloacae complex NOT DETECTED NOT DETECTED   Escherichia coli NOT DETECTED NOT DETECTED   Klebsiella aerogenes NOT DETECTED NOT DETECTED   Klebsiella oxytoca NOT DETECTED NOT DETECTED   Klebsiella pneumoniae NOT DETECTED NOT DETECTED   Proteus species NOT DETECTED NOT DETECTED   Salmonella species NOT DETECTED NOT DETECTED   Serratia marcescens NOT DETECTED NOT DETECTED   Haemophilus influenzae NOT DETECTED NOT DETECTED   Neisseria meningitidis NOT DETECTED NOT DETECTED   Pseudomonas  aeruginosa NOT DETECTED NOT DETECTED   Stenotrophomonas maltophilia NOT DETECTED NOT DETECTED   Candida albicans NOT DETECTED NOT DETECTED   Candida auris NOT DETECTED NOT DETECTED   Candida glabrata NOT DETECTED NOT DETECTED   Candida krusei NOT DETECTED NOT DETECTED   Candida parapsilosis NOT DETECTED NOT DETECTED   Candida tropicalis NOT DETECTED NOT DETECTED   Cryptococcus neoformans/gattii NOT DETECTED NOT DETECTED    Wynona Neat, PharmD, BCPS  12/06/2020  3:34 AM

## 2020-12-06 NOTE — Progress Notes (Signed)
PROGRESS NOTE  [name] [name] XID:[id number] DOB: August 27, 1960 DOA: 12/04/2020 PCP: [name], MD  HPI/Recap of past 24 hours: HPI from Dr Su Hoff is a 60 y.o. male with medical history significant of seizure d/o with mild cognitive impairment; NASH cirrhosis; Crohn's; depression/anxiety; DM; HTN; HLD; and OSA on CPAP presenting with R facial pain.   He was seen in the ER on 6/2 for dysphagia x 3 days with sore throat; CT was negative for deep space abscess and he was treated with Clinda and viscous lidocaine.  He returned on 6/4 for ongoing dental pain and sore throat; he was given narcotics and Decadron with plan for dental f/u within a few days.   Per family, he was seen by the dentist and there was no apparent abscess, also saw endodontist 8 days ago and there was nothing obvious other than a spot on the left side with pus. She noticed R facial swelling, difficulty speaking, trouble staying awake and so she took him to the ER.  At the ER, his fever was 103. Pt says his pain is worsening - R facial pain with radiation into the R eye and temporal area, new facial edema around the mouth.  He finished Clinda on Sunday AM.  +facial numbness.  Complaining of mildly worsening vision in his R eye with diplopia. Oral surgeon consulted. In the ED, labs and vital signs fairly stable. Pt admitted for further management.    Today, pt still reports facial pain, some swelling, denies any fever/chills, headache, chest pain, N/V. Discussed with wife at bedside.    Assessment/Plan: Principal Problem:   Facial cellulitis Active Problems:   Essential hypertension   CROHN'S DISEASE, LARGE AND SMALL INTESTINES   DM2 (diabetes mellitus, type 2) (HCC)   Seizure (HCC)   OSA on CPAP   Memory loss   Facial cellulitis Vs Odontogenic infection vs inflammatory soft tissue Currently afebrile with no leukocytosis BC X 2 showed strep spp in 1/4 bottles, repeat pending collection Repeat CT shows edema  and soft tissue thickening overlying the R anterior mandible, c/w cellulitis/phlegmon Orthopantogram showed no acute abnormality Temporal artery US showed: absence of a "halo" sign in the bilateral temporal artery, although not definitive, makes a diagnosis of temporal arteritis unlikely Dr. Conley Simmonds (oral surgery) consulted, recommend starting steroid and peridex mouthrinses. Plan for I&D, exploration, possible teeth extraction and bone debridement pending on findings on 6/16 Continue IV Cefepime/Vanc for now Continue Neurontin Daily CBC   Seizure d/o Continue Lamictal   NASH cirrhosis Continue Rifaximin Appears to be compensated at this time   Crohn's Continue mesalamine, methscopolamine Hold mercaptopurine in the setting of infection  Macrocytic anemia Anemia panel pending Daily CBC   Cognitive impairment Improved Likely exacerbated by current illness, fever Continue eszopiclone (Ambien formulary substitution), Zoloft   DM Prior A1c was 4.5 Not on meds Monitor for now  OSA Hold CPAP given facial swelling      Estimated body mass index is 25.09 kg/m as calculated from the following:   Height as of this encounter: 5' 11"  (1.803 m).   Weight as of this encounter: 81.6 kg.     Code Status: Full  Family Communication: Discussed with wife at bedside  Disposition Plan: Status is: Inpatient  Remains inpatient appropriate because:Inpatient level of care appropriate due to severity of illness  Dispo: The patient is from: Home              Anticipated d/c is to: Home  Patient currently is not medically stable to d/c.   Difficult to place patient No    Consultants: Oral surgeon  Procedures: None  Antimicrobials: Cefepime Vancomycin  DVT prophylaxis:  Lovenox   Objective: Vitals:   12/05/20 2009 12/06/20 0331 12/06/20 0934 12/06/20 1326  BP: (!) 165/75 107/65 (!) 155/74 (!) 149/81  Pulse: 76 85 85 83  Resp: 18 17  17   Temp: 99.4 F  (37.4 C) 98.8 F (37.1 C) 98.4 F (36.9 C) 98.4 F (36.9 C)  TempSrc: Oral Oral Oral Oral  SpO2: 97% 95% 95% 96%  Weight:      Height:        Intake/Output Summary (Last 24 hours) at 12/06/2020 1712 Last data filed at 12/06/2020 1600 Gross per 24 hour  Intake 2000.52 ml  Output 2025 ml  Net -24.48 ml   Filed Weights   12/04/20 1952  Weight: 81.6 kg    Exam: General: Noted mild facial swelling with some erythema on R side of face, NAD Cardiovascular: S1, S2 present Respiratory: CTAB Abdomen: Soft, nontender, nondistended, bowel sounds present Musculoskeletal: No bilateral pedal edema noted Skin: Normal Psychiatry: Normal mood     Data Reviewed: CBC: Recent Labs  Lab 12/04/20 1958 12/06/20 0018  WBC 8.5 7.2  NEUTROABS 6.9  --   HGB 12.0* 10.5*  HCT 33.0* 29.2*  MCV 105.1* 107.4*  PLT 123* 99*   Basic Metabolic Panel: Recent Labs  Lab 12/04/20 1958 12/04/20 2211 12/06/20 0018  NA 138  --  138  K 3.8  --  3.6  CL 105  --  107  CO2 23  --  22  GLUCOSE 134*  --  77  BUN 11  --  11  CREATININE 0.73  --  0.72  CALCIUM 8.7*  --  8.2*  MG  --  1.5* 2.0  PHOS  --  1.9*  --    GFR: Estimated Creatinine Clearance: 105.9 mL/min (by C-G formula based on SCr of 0.72 mg/dL). Liver Function Tests: Recent Labs  Lab 12/04/20 1958  AST 27  ALT 19  ALKPHOS 121  BILITOT 3.1*  PROT 6.8  ALBUMIN 4.1   No results for input(s): LIPASE, AMYLASE in the last 168 hours. Recent Labs  Lab 12/04/20 2211  AMMONIA 62*   Coagulation Profile: Recent Labs  Lab 12/04/20 1958  INR 1.3*   Cardiac Enzymes: No results for input(s): CKTOTAL, CKMB, CKMBINDEX, TROPONINI in the last 168 hours. BNP (last 3 results) No results for input(s): PROBNP in the last 8760 hours. HbA1C: No results for input(s): HGBA1C in the last 72 hours. CBG: No results for input(s): GLUCAP in the last 168 hours. Lipid Profile: No results for input(s): CHOL, HDL, LDLCALC, TRIG, CHOLHDL,  LDLDIRECT in the last 72 hours. Thyroid Function Tests: No results for input(s): TSH, T4TOTAL, FREET4, T3FREE, THYROIDAB in the last 72 hours. Anemia Panel: No results for input(s): VITAMINB12, FOLATE, FERRITIN, TIBC, IRON, RETICCTPCT in the last 72 hours. Urine analysis:    Component Value Date/Time   COLORURINE YELLOW 12/04/2020 2018   APPEARANCEUR CLEAR 12/04/2020 2018   APPEARANCEUR Clear 01/12/2020 0906   LABSPEC 1.018 12/04/2020 2018   PHURINE 7.5 12/04/2020 2018   GLUCOSEU NEGATIVE 12/04/2020 2018   HGBUR LARGE (A) 12/04/2020 2018   BILIRUBINUR NEGATIVE 12/04/2020 2018   BILIRUBINUR Negative 01/12/2020 0906   KETONESUR NEGATIVE 12/04/2020 2018   PROTEINUR 30 (A) 12/04/2020 2018   UROBILINOGEN 4.0 (H) 12/02/2019 1805   NITRITE NEGATIVE 12/04/2020 2018  LEUKOCYTESUR NEGATIVE 12/04/2020 2018   Sepsis Labs: @LABRCNTIP (procalcitonin:4,lacticidven:4)  ) Recent Results (from the past 240 hour(s))  Culture, blood (Routine x 2)     Status: None (Preliminary result)   Collection Time: 12/04/20  7:58 PM   Specimen: BLOOD LEFT WRIST  Result Value Ref Range Status   Specimen Description BLOOD LEFT WRIST  Final   Special Requests   Final    BOTTLES DRAWN AEROBIC AND ANAEROBIC Blood Culture adequate volume   Culture   Final    NO GROWTH 2 DAYS Performed at [facility], [place] [address], [place], [place] [address]    Report Status PENDING  Incomplete  Culture, blood (Routine x 2)     Status: None (Preliminary result)   Collection Time: 12/04/20  8:00 PM   Specimen: BLOOD LEFT FOREARM  Result Value Ref Range Status   Specimen Description BLOOD LEFT FOREARM  Final   Special Requests   Final    BOTTLES DRAWN AEROBIC AND ANAEROBIC Blood Culture adequate volume   Culture  Setup Time   Final    GRAM POSITIVE COCCI IN CHAINS AEROBIC BOTTLE ONLY Organism ID to follow CRITICAL RESULT CALLED TO, READ BACK BY AND VERIFIED WITHAlona Bene PHARMD, AT 0319 12/06/20 [place] Performed at [facility], [place] [address], [place], [place] [address]    Culture GRAM POSITIVE COCCI  Final   Report Status PENDING  Incomplete  Blood Culture ID Panel (Reflexed)     Status: Abnormal   Collection Time: 12/04/20  8:00 PM  Result Value Ref Range Status   Enterococcus faecalis NOT DETECTED NOT DETECTED Final   Enterococcus Faecium NOT DETECTED NOT DETECTED Final   Listeria monocytogenes NOT DETECTED NOT DETECTED Final   Staphylococcus species NOT DETECTED NOT DETECTED Final   Staphylococcus aureus (BCID) NOT DETECTED NOT DETECTED Final   Staphylococcus epidermidis NOT DETECTED NOT DETECTED Final   Staphylococcus lugdunensis NOT DETECTED NOT DETECTED Final   Streptococcus species DETECTED (A) NOT DETECTED Final    Comment: Not Enterococcus species, Streptococcus agalactiae, Streptococcus pyogenes, or Streptococcus pneumoniae. CRITICAL RESULT CALLED TO, READ BACK BY AND VERIFIED WITH: VPamalee Leyden PHARMD, AT 0319 12/06/20 D. VANHOOK    Streptococcus agalactiae NOT DETECTED NOT DETECTED Final   Streptococcus pneumoniae NOT DETECTED NOT DETECTED Final   Streptococcus pyogenes NOT DETECTED NOT DETECTED Final   A.calcoaceticus-baumannii NOT DETECTED NOT DETECTED Final   Bacteroides fragilis NOT DETECTED NOT DETECTED Final   Enterobacterales NOT DETECTED NOT DETECTED Final   Enterobacter cloacae complex NOT DETECTED NOT DETECTED Final   Escherichia coli NOT DETECTED NOT DETECTED Final   Klebsiella aerogenes NOT DETECTED NOT DETECTED Final   Klebsiella oxytoca NOT DETECTED NOT DETECTED Final   Klebsiella pneumoniae NOT DETECTED NOT DETECTED Final   Proteus species NOT DETECTED NOT DETECTED Final   Salmonella species NOT DETECTED NOT DETECTED Final   Serratia marcescens NOT DETECTED NOT DETECTED Final   Haemophilus influenzae NOT DETECTED NOT DETECTED Final   Neisseria meningitidis NOT DETECTED NOT DETECTED Final   Pseudomonas aeruginosa NOT DETECTED NOT DETECTED  Final   Stenotrophomonas maltophilia NOT DETECTED NOT DETECTED Final   Candida albicans NOT DETECTED NOT DETECTED Final   Candida auris NOT DETECTED NOT DETECTED Final   Candida glabrata NOT DETECTED NOT DETECTED Final   Candida krusei NOT DETECTED NOT DETECTED Final   Candida parapsilosis NOT DETECTED NOT DETECTED Final   Candida tropicalis NOT DETECTED NOT DETECTED Final   Cryptococcus neoformans/gattii  NOT DETECTED NOT DETECTED Final    Comment: Performed at [facility], [place] [address], [place], [place] [address]  Resp Panel by RT-PCR (Flu A&B, Covid) Nasopharyngeal Swab     Status: None   Collection Time: 12/05/20  3:47 AM   Specimen: Nasopharyngeal Swab; Nasopharyngeal(NP) swabs in vial transport medium  Result Value Ref Range Status   SARS Coronavirus 2 by RT PCR NEGATIVE NEGATIVE Final    Comment: (NOTE) SARS-CoV-2 target nucleic acids are NOT DETECTED.  The SARS-CoV-2 RNA is generally detectable in upper respiratory specimens during the acute phase of infection. The lowest concentration of SARS-CoV-2 viral copies this assay can detect is 138 copies/mL. A negative result does not preclude SARS-Cov-2 infection and should not be used as the sole basis for treatment or other patient management decisions. A negative result may occur with  improper specimen collection/handling, submission of specimen other than nasopharyngeal swab, presence of viral mutation(s) within the areas targeted by this assay, and inadequate number of viral copies(<138 copies/mL). A negative result must be combined with clinical observations, patient history, and epidemiological information. The expected result is Negative.  Fact Sheet for Patients:  EntrepreneurPulse.com.au  Fact Sheet for Healthcare Providers:  IncredibleEmployment.be  This test is no t yet approved or cleared by the [place] FDA and  has been authorized for detection and/or diagnosis of  SARS-CoV-2 by FDA under an Emergency Use Authorization (EUA). This EUA will remain  in effect (meaning this test can be used) for the duration of the COVID-19 declaration under Section 564(b)(1) of the Act, 21 U.S.C.section 360bbb-3(b)(1), unless the authorization is terminated  or revoked sooner.       Influenza A by PCR NEGATIVE NEGATIVE Final   Influenza B by PCR NEGATIVE NEGATIVE Final    Comment: (NOTE) The Xpert Xpress SARS-CoV-2/FLU/RSV plus assay is intended as an aid in the diagnosis of influenza from Nasopharyngeal swab specimens and should not be used as a sole basis for treatment. Nasal washings and aspirates are unacceptable for Xpert Xpress SARS-CoV-2/FLU/RSV testing.  Fact Sheet for Patients: EntrepreneurPulse.com.au  Fact Sheet for Healthcare Providers: IncredibleEmployment.be  This test is not yet approved or cleared by the [place] FDA and has been authorized for detection and/or diagnosis of SARS-CoV-2 by FDA under an Emergency Use Authorization (EUA). This EUA will remain in effect (meaning this test can be used) for the duration of the COVID-19 declaration under Section 564(b)(1) of the Act, 21 U.S.C. section 360bbb-3(b)(1), unless the authorization is terminated or revoked.  Performed at [facility], [address] [place], [place] [address]       Studies: DG Orthopantogram  Result Date: 12/06/2020 CLINICAL DATA:  Jaw pain, initial encounter EXAM: ORTHOPANTOGRAM/PANORAMIC COMPARISON:  Maxillofacial CT from the previous day. FINDINGS: Panoramic view of the mandible shows no acute fracture. Dental hardware is noted bilaterally. No periapical abscess is seen. No acute abnormality is noted. IMPRESSION: No acute abnormality seen. Electronically Signed   By: Inez Catalina M.D.   On: 12/06/2020 09:17   TEMPORAL ARTERY  Result Date: 12/06/2020  TEMPORAL ARTERY REPORT Patient Name:  [name] [name]   Date of Exam:   12/06/2020 Medical Rec #: [id number]      Accession #:    [id number] Date of Birth: 1960/10/12      Patient Gender: M Patient Age:   60Y Exam Location:  [facility] Procedure:      VAS [place] Referring Phys: [id number] JENNIFER YATES --------------------------------------------------------------------------------  Indications: Jaw pain, throat pain and scalp tenderness. High Risk Factors: Age > 50 yrs.  Performing Technologist: Maudry Mayhew MHA, RDMS, RVT, RDCS  Examination Guidelines: Patient in reclined position. 2D, color and spectral doppler sampling in the temporal artery along the hairline and temple in the longitudinal plane. 2D images along the hairline and temple in the transverse plane. Exam is bilateral.  Summary: Absence of a "halo" sign in the bilateral temporal artery, although not definitive, makes a diagnosis of temporal arteritis unlikely.  *See table(s) above for measurements and observations.    Preliminary     Scheduled Meds:  chlorhexidine  15 mL Mouth/Throat QID   docusate sodium  100 mg Oral BID   enoxaparin (LOVENOX) injection  40 mg Subcutaneous S97W   folic acid  2 mg Oral Daily   gabapentin  300 mg Oral Daily   lamoTRIgine  150 mg Oral BID   latanoprost  1 drop Both Eyes QHS   mesalamine  1.2 g Oral BID   methscopolamine  5 mg Oral BID   pantoprazole  40 mg Oral BID AC   rifaximin  550 mg Oral BID   sertraline  200 mg Oral Q breakfast   sodium chloride flush  3 mL Intravenous Q12H   traZODone  150 mg Oral QHS    Continuous Infusions:  ceFEPime (MAXIPIME) IV 2 g (12/06/20 1336)   lactated ringers 75 mL/hr at 12/06/20 0942   vancomycin 1,250 mg (12/06/20 1235)     LOS: 1 day     Alma Friendly, MD [facility]  If 7PM-7AM, please contact night-coverage [email or web address] 12/06/2020, 5:12 PM

## 2020-12-06 NOTE — Consult Note (Addendum)
H&P Infection  Exam Date: 12/06/20  ID: The patient is a 86 yoM who presented with pain and swelling of the lower right jaw.  History of Present Illness:  The patient reports having dental pain that began over a week ago. He was started on Clindamycin and saw a general dentist and was subsequently referred to an endodontist for evaluation who was concerned with pathosis area #20/21. However no obvious carious or fractured teeth could be identified and symptomatic teeth #28 and 29 were believed to be related to trigeminal neuralgia. Cone beam CT scan was taken at an imaging center on 11/27/20 and imaging was sent to me from his wife with results indicating concern for osteomyelitis originating from the LEFT mandible. Since that time the swelling/infection has increased and has spread to his right mandible and is continuing to worsen. He is having trouble eating due to it and reports dysphagia. He has a complex medical history with immunocompromised status and PCN allergy - was started on Cefepime/Vanc on admission. The patient is NPO .   Clinical Exam: Extraoral Exam:              Patient is alert, orientated and in mild distress             CN II-XII intact except right V3 paresthesia that started today             There is miinimal facial swelling but some erythema on the right side of the face/neck.    Intraoral Exam:              The patient does not have trismus with maximum incisal opening of 50 mm.             The  vestibules are not raised.             The floor of mouth is soft and non-elevated             There is not lateral pharyngeal swelling with the uvula midline             There is no palatal draping present.             Oral airway is patent     Gingiva is inflamed/sloughing with multiple pustules present from teeth #27-31 both buccally and lingually as well as a lingual pustule adjacent to teeth #20/21 with appearance concerning for osteomyelitis             No dental caries or  fractured teeth are noted however teeth #20-30 are mobile  #28, 29 are particularly symptomatic     Radiographic Exam:               Panorex shows no evidence of osteomyelitis, caries, or periapical pathosis involving the teeth.             A CT of the larynx with contrast was obtained showing no fluid collections but mild/diffuse cellulitis of the right jaw/neck. On CT there is area of left lingual mandible concerning for possible osteomyelitic process and this is better visualized on outside CBCT.    Assessment: Atypical mucogingival inflammatory vs infectious process of the mandibular soft tissues bilaterally. There is no evidence of carious or fractured teeth however tooth #28 and 29 are mobile.   -Differential Dx -- odontogenic infection (osteomyelitis) vs inflammatory soft tissue condition (ie acute necrotizing ulcerative gingivitis, erythema multiforme, etc.)  Plan:   -Please start on steroid and peridex mouthrinses -Due to worsening clinical  condition will plan to take patient to OR tomorrow 6/16 for I&D and exploration, possible extraction of teeth and debridement of bone depending on clinical findings -Will contact OR for scheduling; will need consent etc.  Risks, complications and alternatives of tooth extraction and incision and drainage procedures were discussed and questions were answered.  Among all potential risks and complications, I emphasized the potential for pain, bleeding, swelling, infection, localized alveolar osteitis (dry socket), temporary and permanent lingual and inferior alveolar nerve injury, oroantral (sinus) communication, oronasal communication, jaw fracture, damage to adjacent teeth and tissue, joint discomfort, bone/tooth fragments, recurrence of infection, need for additional procedures, facial nerve injury, scarring, limited mouth opening, drain placement, aspiration and anesthetic mishap.   Terese Door, DDS

## 2020-12-06 NOTE — Plan of Care (Signed)

## 2020-12-06 NOTE — Telephone Encounter (Signed)
Pt's wife, [name] [name] called, cancelling appt due have be admitted into hospital

## 2020-12-06 NOTE — Progress Notes (Signed)
Bilateral temporal artery duplex completed. Refer to "CV Proc" under chart review to view preliminary results.  12/06/2020 11:46 AM Kelby Aline., MHA, RVT, RDCS, RDMS

## 2020-12-07 ENCOUNTER — Inpatient Hospital Stay (HOSPITAL_COMMUNITY): Payer: 59 | Admitting: Registered Nurse

## 2020-12-07 ENCOUNTER — Encounter (HOSPITAL_COMMUNITY): Payer: Self-pay | Admitting: Internal Medicine

## 2020-12-07 ENCOUNTER — Encounter (HOSPITAL_COMMUNITY)
Admission: EM | Disposition: A | Discharge status: Home Health | Payer: Self-pay | Source: Home / Self Care | Attending: Family Medicine

## 2020-12-07 DIAGNOSIS — E876 Hypokalemia: Secondary | ICD-10-CM

## 2020-12-07 DIAGNOSIS — K5080 Crohn's disease of both small and large intestine without complications: Secondary | ICD-10-CM

## 2020-12-07 DIAGNOSIS — I10 Essential (primary) hypertension: Secondary | ICD-10-CM

## 2020-12-07 DIAGNOSIS — E119 Type 2 diabetes mellitus without complications: Secondary | ICD-10-CM

## 2020-12-07 HISTORY — PX: TOOTH EXTRACTION: SHX859

## 2020-12-07 HISTORY — PX: DEBRIDEMENT MANDIBLE: SHX5308

## 2020-12-07 HISTORY — PX: INCISION AND DRAINAGE ABSCESS: SHX5864

## 2020-12-07 LAB — GLUCOSE, CAPILLARY: Glucose-Capillary: 119 mg/dL — ABNORMAL HIGH (ref 70–99)

## 2020-12-07 LAB — CBC WITH DIFFERENTIAL/PLATELET
Abs Immature Granulocytes: 0.02 10*3/uL (ref 0.00–0.07)
Basophils Absolute: 0 10*3/uL (ref 0.0–0.1)
Basophils Relative: 0 %
Eosinophils Absolute: 0.1 10*3/uL (ref 0.0–0.5)
Eosinophils Relative: 2 %
HCT: 26.1 % — ABNORMAL LOW (ref 39.0–52.0)
Hemoglobin: 9.6 g/dL — ABNORMAL LOW (ref 13.0–17.0)
Immature Granulocytes: 1 %
Lymphocytes Relative: 13 %
Lymphs Abs: 0.5 10*3/uL — ABNORMAL LOW (ref 0.7–4.0)
MCH: 38.7 pg — ABNORMAL HIGH (ref 26.0–34.0)
MCHC: 36.8 g/dL — ABNORMAL HIGH (ref 30.0–36.0)
MCV: 105.2 fL — ABNORMAL HIGH (ref 80.0–100.0)
Monocytes Absolute: 0.4 10*3/uL (ref 0.1–1.0)
Monocytes Relative: 11 %
Neutro Abs: 3.1 10*3/uL (ref 1.7–7.7)
Neutrophils Relative %: 73 %
Platelets: 99 10*3/uL — ABNORMAL LOW (ref 150–400)
RBC: 2.48 MIL/uL — ABNORMAL LOW (ref 4.22–5.81)
RDW: 14.5 % (ref 11.5–15.5)
WBC: 4.2 10*3/uL (ref 4.0–10.5)
nRBC: 0 % (ref 0.0–0.2)

## 2020-12-07 LAB — POCT I-STAT, CHEM 8
BUN: 10 mg/dL (ref 6–20)
Calcium, Ion: 1.21 mmol/L (ref 1.15–1.40)
Chloride: 107 mmol/L (ref 98–111)
Creatinine, Ser: 0.5 mg/dL — ABNORMAL LOW (ref 0.61–1.24)
Glucose, Bld: 108 mg/dL — ABNORMAL HIGH (ref 70–99)
HCT: 27 % — ABNORMAL LOW (ref 39.0–52.0)
Hemoglobin: 9.2 g/dL — ABNORMAL LOW (ref 13.0–17.0)
Potassium: 3.8 mmol/L (ref 3.5–5.1)
Sodium: 140 mmol/L (ref 135–145)
TCO2: 22 mmol/L (ref 22–32)

## 2020-12-07 LAB — BASIC METABOLIC PANEL
Anion gap: 8 (ref 5–15)
BUN: 10 mg/dL (ref 6–20)
CO2: 25 mmol/L (ref 22–32)
Calcium: 8.3 mg/dL — ABNORMAL LOW (ref 8.9–10.3)
Chloride: 106 mmol/L (ref 98–111)
Creatinine, Ser: 0.71 mg/dL (ref 0.61–1.24)
GFR, Estimated: >60 mL/min (ref >60)
Glucose, Bld: 71 mg/dL (ref 70–99)
Potassium: 3.1 mmol/L — ABNORMAL LOW (ref 3.5–5.1)
Sodium: 139 mmol/L (ref 135–145)

## 2020-12-07 LAB — FERRITIN: Ferritin: 607 ng/mL — ABNORMAL HIGH (ref 24–336)

## 2020-12-07 LAB — IRON AND TIBC
Iron: 35 ug/dL — ABNORMAL LOW (ref 45–182)
Saturation Ratios: 17 % — ABNORMAL LOW (ref 17.9–39.5)
TIBC: 207 ug/dL — ABNORMAL LOW (ref 250–450)
UIBC: 172 ug/dL

## 2020-12-07 LAB — FOLATE: Folate: 30.6 ng/mL (ref >5.9)

## 2020-12-07 LAB — VITAMIN B12: Vitamin B-12: 605 pg/mL (ref 180–914)

## 2020-12-07 SURGERY — DENTAL RESTORATION/EXTRACTIONS
Anesthesia: General | Site: Mouth

## 2020-12-07 MED ORDER — LIDOCAINE-EPINEPHRINE 2 %-1:100000 IJ SOLN
INTRAMUSCULAR | Status: AC
  Filled 2020-12-07: qty 1

## 2020-12-07 MED ORDER — MIDAZOLAM HCL 5 MG/5ML IJ SOLN
INTRAMUSCULAR | Status: DC | PRN
  Administered 2020-12-07 (×2): 2 mg via INTRAVENOUS

## 2020-12-07 MED ORDER — PROPOFOL 10 MG/ML IV BOLUS
INTRAVENOUS | Status: DC | PRN
  Administered 2020-12-07: 20 mg via INTRAVENOUS
  Administered 2020-12-07: 120 mg via INTRAVENOUS

## 2020-12-07 MED ORDER — ONDANSETRON HCL 4 MG/2ML IJ SOLN
INTRAMUSCULAR | Status: DC | PRN
  Administered 2020-12-07: 4 mg via INTRAVENOUS

## 2020-12-07 MED ORDER — FENTANYL CITRATE (PF) 250 MCG/5ML IJ SOLN
INTRAMUSCULAR | Status: DC | PRN
  Administered 2020-12-07 (×4): 50 ug via INTRAVENOUS

## 2020-12-07 MED ORDER — POTASSIUM CHLORIDE 10 MEQ/100ML IV SOLN
10.0000 meq | INTRAVENOUS | Status: AC
Start: 2020-12-07 — End: 2020-12-07
  Administered 2020-12-07 (×4): 10 meq via INTRAVENOUS
  Filled 2020-12-07: qty 100

## 2020-12-07 MED ORDER — DEXAMETHASONE SODIUM PHOSPHATE 10 MG/ML IJ SOLN
INTRAMUSCULAR | Status: AC
  Filled 2020-12-07: qty 1

## 2020-12-07 MED ORDER — OXYMETAZOLINE HCL 0.05 % NA SOLN
NASAL | Status: AC
  Filled 2020-12-07: qty 30

## 2020-12-07 MED ORDER — LIDOCAINE 2% (20 MG/ML) 5 ML SYRINGE
INTRAMUSCULAR | Status: DC | PRN
  Administered 2020-12-07: 20 mg via INTRAVENOUS

## 2020-12-07 MED ORDER — 0.9 % SODIUM CHLORIDE (POUR BTL) OPTIME
TOPICAL | Status: DC | PRN
  Administered 2020-12-07 (×2): 1000 mL

## 2020-12-07 MED ORDER — DEXAMETHASONE SODIUM PHOSPHATE 10 MG/ML IJ SOLN
INTRAMUSCULAR | Status: DC | PRN
  Administered 2020-12-07: 4 mg via INTRAVENOUS

## 2020-12-07 MED ORDER — MIDAZOLAM HCL 2 MG/2ML IJ SOLN
INTRAMUSCULAR | Status: AC
  Filled 2020-12-07: qty 2

## 2020-12-07 MED ORDER — SODIUM CHLORIDE 0.9 % IR SOLN
Status: DC | PRN
  Administered 2020-12-07: 1000 mL

## 2020-12-07 MED ORDER — LIDOCAINE HCL (PF) 2 % IJ SOLN
INTRAMUSCULAR | Status: AC
  Filled 2020-12-07: qty 10

## 2020-12-07 MED ORDER — FENTANYL CITRATE (PF) 250 MCG/5ML IJ SOLN
INTRAMUSCULAR | Status: AC
  Filled 2020-12-07: qty 5

## 2020-12-07 MED ORDER — PROPOFOL 10 MG/ML IV BOLUS
INTRAVENOUS | Status: AC
  Filled 2020-12-07: qty 20

## 2020-12-07 MED ORDER — APREPITANT 40 MG PO CAPS
40.0000 mg | ORAL_CAPSULE | ORAL | Status: AC
  Administered 2020-12-07: 40 mg via ORAL
  Filled 2020-12-07: qty 1

## 2020-12-07 MED ORDER — LACTATED RINGERS IV SOLN: INTRAVENOUS | Status: DC

## 2020-12-07 MED ORDER — ROCURONIUM BROMIDE 10 MG/ML (PF) SYRINGE
PREFILLED_SYRINGE | INTRAVENOUS | Status: DC | PRN
  Administered 2020-12-07: 30 mg via INTRAVENOUS
  Administered 2020-12-07: 50 mg via INTRAVENOUS
  Administered 2020-12-07: 30 mg via INTRAVENOUS

## 2020-12-07 MED ORDER — ONDANSETRON HCL 4 MG/2ML IJ SOLN
INTRAMUSCULAR | Status: AC
  Filled 2020-12-07: qty 2

## 2020-12-07 MED ORDER — VANCOMYCIN HCL 1000 MG/200ML IV SOLN: 1000.0000 mg | INTRAVENOUS | Status: DC

## 2020-12-07 MED ORDER — ROCURONIUM BROMIDE 10 MG/ML (PF) SYRINGE
PREFILLED_SYRINGE | INTRAVENOUS | Status: AC
  Filled 2020-12-07: qty 10

## 2020-12-07 MED ORDER — SODIUM CHLORIDE 0.9 % IV SOLN: 2.0000 g | INTRAVENOUS | Status: DC

## 2020-12-07 MED ORDER — CHLORHEXIDINE GLUCONATE CLOTH 2 % EX PADS
6.0000 | MEDICATED_PAD | Freq: Once | CUTANEOUS | Status: AC
  Administered 2020-12-07: 6 via TOPICAL

## 2020-12-07 MED ORDER — CHLORHEXIDINE GLUCONATE CLOTH 2 % EX PADS: 6.0000 | MEDICATED_PAD | Freq: Once | CUTANEOUS | Status: DC

## 2020-12-07 MED ORDER — SUGAMMADEX SODIUM 200 MG/2ML IV SOLN
INTRAVENOUS | Status: DC | PRN
  Administered 2020-12-07: 200 mg via INTRAVENOUS

## 2020-12-07 MED ORDER — LIDOCAINE-EPINEPHRINE 2 %-1:100000 IJ SOLN
INTRAMUSCULAR | Status: DC | PRN
  Administered 2020-12-07: 7 mL via INTRADERMAL

## 2020-12-07 SURGICAL SUPPLY — 59 items
BLADE SURG 15 STRL LF DISP TIS (BLADE) ×1 IMPLANT
BLADE SURG 15 STRL SS (BLADE) ×6
BUR CROSS CUT (BURR)
BUR CROSS CUT FISSURE 1.2 (BURR) ×1 IMPLANT
BUR CROSS CUT FISSURE 1.6 (BURR) ×1 IMPLANT
BUR EGG ELITE 4.0 (BURR) ×1 IMPLANT
BUR PRESCISION 1.7 ELITE (BURR) IMPLANT
BUR SRG MED 1.6XXCUT FSSR (BURR) IMPLANT
BUR SURG 4X8 MED (BURR) IMPLANT
BURR SRG MED 1.6XXCUT FSSR (BURR)
BURR SURG 4X8 MED (BURR)
CANISTER SUCT 3000ML PPV (MISCELLANEOUS) ×2 IMPLANT
CNTNR URN SCR LID CUP LEK RST (MISCELLANEOUS) IMPLANT
CONT SPEC 4OZ STRL OR WHT (MISCELLANEOUS) ×4
COVER SURGICAL LIGHT HANDLE (MISCELLANEOUS) ×2 IMPLANT
DECANTER SPIKE VIAL GLASS SM (MISCELLANEOUS) ×2 IMPLANT
DRAPE U-SHAPE 76X120 STRL (DRAPES) ×2 IMPLANT
ELECT CAUTERY BLADE 6.4 (BLADE) ×1 IMPLANT
ELECT NDL TIP 2.8 STRL (NEEDLE) IMPLANT
ELECT NEEDLE TIP 2.8 STRL (NEEDLE) ×2 IMPLANT
ELECT REM PT RETURN 9FT ADLT (ELECTROSURGICAL) ×2
ELECTRODE REM PT RTRN 9FT ADLT (ELECTROSURGICAL) IMPLANT
GAUZE PACKING FOLDED 2  STR (GAUZE/BANDAGES/DRESSINGS) ×2
GAUZE PACKING FOLDED 2 STR (GAUZE/BANDAGES/DRESSINGS) ×1 IMPLANT
GAUZE SPONGE 4X4 12PLY STRL (GAUZE/BANDAGES/DRESSINGS) ×1 IMPLANT
GAUZE SPONGE 4X4 12PLY STRL LF (GAUZE/BANDAGES/DRESSINGS) ×2 IMPLANT
GLOVE BIO SURGEON STRL SZ8 (GLOVE) ×2 IMPLANT
GLOVE SRG 8 PF TXTR STRL LF DI (GLOVE) IMPLANT
GLOVE SURG MICRO LTX SZ7 (GLOVE) ×1 IMPLANT
GLOVE SURG UNDER POLY LF SZ8 (GLOVE)
GOWN STRL REUS W/ TWL LRG LVL3 (GOWN DISPOSABLE) ×1 IMPLANT
GOWN STRL REUS W/ TWL XL LVL3 (GOWN DISPOSABLE) ×1 IMPLANT
GOWN STRL REUS W/TWL LRG LVL3 (GOWN DISPOSABLE) ×2
GOWN STRL REUS W/TWL XL LVL3 (GOWN DISPOSABLE) ×2
IV NS 1000ML (IV SOLUTION) ×2
IV NS 1000ML BAXH (IV SOLUTION) ×1 IMPLANT
KIT BASIN OR (CUSTOM PROCEDURE TRAY) ×2 IMPLANT
KIT TURNOVER KIT B (KITS) ×2 IMPLANT
NDL 18GX1X1/2 (RX/OR ONLY) (NEEDLE) IMPLANT
NDL HYPO 25GX1X1/2 BEV (NEEDLE) ×2 IMPLANT
NEEDLE 18GX1X1/2 (RX/OR ONLY) (NEEDLE) ×2 IMPLANT
NEEDLE 22X1 1/2 (OR ONLY) (NEEDLE) ×1 IMPLANT
NEEDLE HYPO 25GX1X1/2 BEV (NEEDLE) ×4 IMPLANT
NS IRRIG 1000ML POUR BTL (IV SOLUTION) ×3 IMPLANT
PAD ARMBOARD 7.5X6 YLW CONV (MISCELLANEOUS) ×2 IMPLANT
PENCIL BUTTON HOLSTER BLD 10FT (ELECTRODE) ×1 IMPLANT
SLEEVE IRRIGATION ELITE 7 (MISCELLANEOUS) ×2 IMPLANT
SPONGE SURGIFOAM ABS GEL 12-7 (HEMOSTASIS) IMPLANT
SUCTION FRAZIER TIP 8 FR DISP (SUCTIONS) ×4
SUCTION TUBE FRAZIER 8FR DISP (SUCTIONS) IMPLANT
SUT CHROMIC 3 0 PS 2 (SUTURE) ×3 IMPLANT
SWAB CULTURE ESWAB REG 1ML (MISCELLANEOUS) ×1 IMPLANT
SWAB CULTURE LIQ STUART DBL (MISCELLANEOUS) ×1 IMPLANT
SYR BULB IRRIG 60ML STRL (SYRINGE) ×2 IMPLANT
SYR CONTROL 10ML LL (SYRINGE) ×2 IMPLANT
TRAY ENT MC OR (CUSTOM PROCEDURE TRAY) ×2 IMPLANT
TUBE SALEM SUMP 16 FR W/ARV (TUBING) ×2 IMPLANT
TUBING IRRIGATION (MISCELLANEOUS) ×2 IMPLANT
YANKAUER SUCT BULB TIP NO VENT (SUCTIONS) ×2 IMPLANT

## 2020-12-07 NOTE — Anesthesia Procedure Notes (Signed)
Procedure Name: Intubation Date/Time: 12/07/2020 3:57 PM Performed by: Geraldine Contras, CRNA Pre-anesthesia Checklist: Patient identified, Emergency Drugs available, Suction available and Patient being monitored Patient Re-evaluated:Patient Re-evaluated prior to induction Oxygen Delivery Method: Circle System Utilized Preoxygenation: Pre-oxygenation with 100% oxygen Induction Type: IV induction Ventilation: Mask ventilation without difficulty Laryngoscope Size: Mac and 4 Grade View: Grade I Tube type: Oral Tube size: 7.5 mm Number of attempts: 1 Airway Equipment and Method: Stylet and Oral airway Placement Confirmation: ETT inserted through vocal cords under direct vision, positive ETCO2 and breath sounds checked- equal and bilateral Tube secured with: Tape Dental Injury: Teeth and Oropharynx as per pre-operative assessment

## 2020-12-07 NOTE — Op Note (Signed)
PRE-OPERATIVE DIAGNOSIS:  Osteomyelitis of the mandible POST-OPERATIVE DIAGNOSIS:  Same    SURGEON:  Surgeon(s) and Role:    [name], DMD - Primary   Procedure: Surgical removal #20-30 (D7210 x 11) Intraoral I&D of vestibular abscess (H8527) Debridement of mandible (21025) Alveoloplasty LR, LL (D7310 x 2) Removal of mandibular torus LL (P8242)  Description of Operation/Procedure:   The patient was encountered in [facility] OR Room 12.  General anesthesia was induced and an ETT was secured in the standard fashion.  The table was moved slightly away from anesthesia and the patient was properly padded, relieving all pressure points.  A formal time-out was executed.  2% lidocaine with 1:100,000 epinephrine was infiltrated into the proposed surgical sites.  The patient was prepped and draped in the standard sterile fashion and a throat pack was placed.                Attention was then directed intraorally to the mandible.  A full thickness mucoperiosteal flap was elevated on both the buccal and lingual.  Blunt dissection ensued into the vestibular space and purulent drainage was encountered from the right mandibular vestibule. This was sent for aerobic and anaerobic and fungal cultures. Teeth #28 and 29 were removed initially after which it was clear there extensive infection/granulation tissue apically around the tooth roots. Teeth were then removed sequentially as the dead/necrotic bone was debrided and in total teeth #20-30 were removed surgically without complication.  Osteomyelitis mostly involved the mandibular cancellous bone which was found to be necrotic and full of granulation tissue in these areas -- it was debrided and samples of both hard and soft tissues were submitted for both pathologic review and cultures. Mandibular debridement was completed to get to healthy, bleeding bone. The IAN was visualized the right side and was involved with the osteo -- it was likely traumatized 2/2 the  necessary mandibular debridedment. Brisk bleeding was also encountered in the area of the right IAN which necessitated cautery for hemostasis. The areas of necrotic mandibular soft tissue were also excised and the left mandibular torus was removed. Areas were thoroughly irrigated and the remaining tissue was reapproximated with 3-0 chromic gut suture.   The oral cavity was suctioned free of all debris and secretions.  The throat pack was removed and an orogastric tube was passed to evacuate the stomach contents.  Sponge and needle counts were correct x 2.  Care of the patient was turned over to the Anesthesia team for uneventful extubation and delivery of the patient to the PACU in stable condition.   ANESTHESIA:   general EBL:  350 mL  DRAINS: None LOCAL MEDICATIONS USED:  LIDOCAINE 2% w/ 1:100000 epi SPECIMEN:  Swab and tissue specimens - cultures for anaerobes, aerobes, and fungal PLAN OF CARE: Return to floor PATIENT DISPOSITION:  PACU - hemodynamically stable. Delay start of Pharmacological VTE agent (>24hrs) due to surgical blood loss or risk of bleeding: no   OMFS Recommendations -Full cup liquid diet -Peridex (chlorhexidine) mouthrnise QID -Continue Cefepime/Vanc for now; recommend ID consult and PICC for outpatient antibiotics; once PICC is in place patient is able to d/c from OMFS perspective -Will follow patient while he is inpatient -Daily CBCs

## 2020-12-07 NOTE — Progress Notes (Signed)
PROGRESS NOTE  JAMORION GOMILLION W[facility]:[id number] DOB: Nov 18, 1960 DOA: 12/04/2020 PCP: [name], MD  Brief narrative  [name] [name] is a 60 y.o. male with medical history significant for seizure disorder with mild cognitive impairment, NASH cirrhosis, Crohn's disease, depression, anxiety, diabetes mellitus, hypertension, obstructive sleep apnea on CPAP presented to hospital with right facial pain.  He was seen in the ED on 11/23/2020 for dysphagia for 3 days with sore throat.  CT was negative for deep space abscess and was treated with clindamycin and viscous lidocaine.  He again came back to the hospital on 6 4 with ongoing dental pain and sore throat.  He was given narcotics and Decadron with plan for follow-up with dental surgery as outpatient.  Patient was seen by dental service but patient continued to have facial swelling difficulty speaking and the patient was brought into the hospital he also had a fever of 103 F.  Patient had been completed to clindamycin. In the ED, labs and vital signs f remained fairly stable.  He was then admitted to hospital for further evaluation and treatment.   Assessment/Plan: Principal Problem:   Facial cellulitis Active Problems:   Essential hypertension   CROHN'S DISEASE, LARGE AND SMALL INTESTINES   DM2 (diabetes mellitus, type 2) (HCC)   Seizure (HCC)   OSA on CPAP   Memory loss   Facial cellulitis / Odontogenic infection  Currently afebrile no leukocytosis.  Blood culture x2 showed strep species.  Repeat scan shows edema and soft tissue thickening.  Orthopantogram showed no acute abnormality.  Oral surgery has been consulted.  Recommended starting steroids and Peridex mouth rinses.  Plan for I&D exploration and possible teeth extraction on 6/16.  Temporal artery ultrasound showed some halo sign in the bitemporal area but unlikely to be a diagnosis of temporal arteritis.  Continue Neurontin.  Seizure disorder. Continue Lamictal   NASH  cirrhosis Continue rifaximin.  Crohn's disease Continue mesalamine, methscopolamine.  Continue to hold mercaptopurine in the setting of infection  Macrocytic anemia Folate of 13, vitamin B12 605.  Slightly low iron with low TIBC.  Ferritin slightly elevated likely anemia of chronic disease.   Cognitive impairment Likely exacerbated by current illness, fever Continue eszopiclone (Ambien formulary substitution), Zoloft from home.   DM type II Previous hemoglobin A1c of 4.5.  Continue diabetic diet.  OSA Due to facial swelling, CPAP on hold.   Estimated body mass index is 25.09 kg/m as calculated from the following:   Height as of this encounter: 5' 11"  (1.803 m).   Weight as of this encounter: 81.6 kg.     Code Status: Full  Family Communication:  None today.  Disposition Plan: Status is: Inpatient  Remains inpatient appropriate because:Inpatient level of care appropriate due to severity of illness  Dispo: The patient is from: Home              Anticipated d/c is to: Home              Patient currently is not medically stable to d/c.   Difficult to place patient No    Consultants: Oral surgeon  Procedures: None yet  Antimicrobials: Cefepime Vancomycin  DVT prophylaxis:  Lovenox subcu   Objective: Vitals:   12/06/20 0934 12/06/20 1326 12/06/20 2016 12/07/20 0344  BP: (!) 155/74 (!) 149/81 (!) 149/85 134/73  Pulse: 85 83 86 79  Resp:  17 19 19   Temp: 98.4 F (36.9 C) 98.4 F (36.9 C) 99.3 F (37.4 C) 98.6  F (37 C)  TempSrc: Oral Oral Oral Oral  SpO2: 95% 96% 100% 98%  Weight:      Height:        Intake/Output Summary (Last 24 hours) at 12/07/2020 1125 Last data filed at 12/07/2020 1032 Gross per 24 hour  Intake 3183.51 ml  Output 1630 ml  Net 1553.51 ml    Filed Weights   12/04/20 1952  Weight: 81.6 kg    Physical exam  General:  Average built, not in obvious distress, mild facial swelling and erythema on the right side of the  face HENT:   No scleral pallor or icterus noted. Oral mucosa is moist.  Chest:  Clear breath sounds.  Diminished breath sounds bilaterally. No crackles or wheezes.  CVS: S1 &S2 heard. No murmur.  Regular rate and rhythm. Abdomen: Soft, nontender, nondistended.  Bowel sounds are heard.   Extremities: No cyanosis, clubbing or edema.  Peripheral pulses are palpable. Psych: Alert, awake and oriented, normal mood CNS:  No cranial nerve deficits.  Power equal in all extremities.   Skin: Warm and dry.  No rashes noted.  Data Reviewed: CBC: Recent Labs  Lab 12/04/20 1958 12/06/20 0018 12/07/20 0034  WBC 8.5 7.2 4.2  NEUTROABS 6.9  --  3.1  HGB 12.0* 10.5* 9.6*  HCT 33.0* 29.2* 26.1*  MCV 105.1* 107.4* 105.2*  PLT 123* 99* 99*    Basic Metabolic Panel: Recent Labs  Lab 12/04/20 1958 12/04/20 2211 12/06/20 0018 12/07/20 0034  NA 138  --  138 139  K 3.8  --  3.6 3.1*  CL 105  --  107 106  CO2 23  --  22 25  GLUCOSE 134*  --  77 71  BUN 11  --  11 10  CREATININE 0.73  --  0.72 0.71  CALCIUM 8.7*  --  8.2* 8.3*  MG  --  1.5* 2.0  --   PHOS  --  1.9*  --   --     GFR: Estimated Creatinine Clearance: 105.9 mL/min (by C-G formula based on SCr of 0.71 mg/dL). Liver Function Tests: Recent Labs  Lab 12/04/20 1958  AST 27  ALT 19  ALKPHOS 121  BILITOT 3.1*  PROT 6.8  ALBUMIN 4.1    No results for input(s): LIPASE, AMYLASE in the last 168 hours. Recent Labs  Lab 12/04/20 2211  AMMONIA 62*    Coagulation Profile: Recent Labs  Lab 12/04/20 1958  INR 1.3*    Cardiac Enzymes: No results for input(s): CKTOTAL, CKMB, CKMBINDEX, TROPONINI in the last 168 hours. BNP (last 3 results) No results for input(s): PROBNP in the last 8760 hours. HbA1C: No results for input(s): HGBA1C in the last 72 hours. CBG: No results for input(s): GLUCAP in the last 168 hours. Lipid Profile: No results for input(s): CHOL, HDL, LDLCALC, TRIG, CHOLHDL, LDLDIRECT in the last 72  hours. Thyroid Function Tests: No results for input(s): TSH, T4TOTAL, FREET4, T3FREE, THYROIDAB in the last 72 hours. Anemia Panel: Recent Labs    12/07/20 0034  VITAMINB12 605  FOLATE 30.6  FERRITIN 607*  TIBC 207*  IRON 35*   Urine analysis:    Component Value Date/Time   COLORURINE YELLOW 12/04/2020 2018   APPEARANCEUR CLEAR 12/04/2020 2018   APPEARANCEUR Clear 01/12/2020 0906   LABSPEC 1.018 12/04/2020 2018   PHURINE 7.5 12/04/2020 2018   GLUCOSEU NEGATIVE 12/04/2020 2018   HGBUR LARGE (A) 12/04/2020 2018   BILIRUBINUR NEGATIVE 12/04/2020 2018   BILIRUBINUR Negative 01/12/2020  [place] 12/04/2020 2018   PROTEINUR 30 (A) 12/04/2020 2018   UROBILINOGEN 4.0 (H) 12/02/2019 1805   NITRITE NEGATIVE 12/04/2020 2018   LEUKOCYTESUR NEGATIVE 12/04/2020 2018   Sepsis Labs: @LABRCNTIP (procalcitonin:4,lacticidven:4)  ) Recent Results (from the past 240 hour(s))  Culture, blood (Routine x 2)     Status: None (Preliminary result)   Collection Time: 12/04/20  7:58 PM   Specimen: BLOOD LEFT WRIST  Result Value Ref Range Status   Specimen Description BLOOD LEFT WRIST  Final   Special Requests   Final    BOTTLES DRAWN AEROBIC AND ANAEROBIC Blood Culture adequate volume   Culture   Final    NO GROWTH 3 DAYS Performed at [facility], [place] [address], [place], [place] [address]    Report Status PENDING  Incomplete  Culture, blood (Routine x 2)     Status: Abnormal (Preliminary result)   Collection Time: 12/04/20  8:00 PM   Specimen: BLOOD LEFT FOREARM  Result Value Ref Range Status   Specimen Description BLOOD LEFT FOREARM  Final   Special Requests   Final    BOTTLES DRAWN AEROBIC AND ANAEROBIC Blood Culture adequate volume   Culture  Setup Time   Final    GRAM POSITIVE COCCI IN CHAINS AEROBIC BOTTLE ONLY Organism ID to follow CRITICAL RESULT CALLED TO, READ BACK BY AND VERIFIED WITH: Alona Bene PHARMD, AT 7001 12/06/20 D. VANHOOK    Culture (A)  Final     STREPTOCOCCUS INTERMEDIUS SUSCEPTIBILITIES TO FOLLOW Performed at [facility], [place] [address] [place], [place] [address]    Report Status PENDING  Incomplete  Blood Culture ID Panel (Reflexed)     Status: Abnormal   Collection Time: 12/04/20  8:00 PM  Result Value Ref Range Status   Enterococcus faecalis NOT DETECTED NOT DETECTED Final   Enterococcus Faecium NOT DETECTED NOT DETECTED Final   Listeria monocytogenes NOT DETECTED NOT DETECTED Final   Staphylococcus species NOT DETECTED NOT DETECTED Final   Staphylococcus aureus (BCID) NOT DETECTED NOT DETECTED Final   Staphylococcus epidermidis NOT DETECTED NOT DETECTED Final   Staphylococcus lugdunensis NOT DETECTED NOT DETECTED Final   Streptococcus species DETECTED (A) NOT DETECTED Final    Comment: Not Enterococcus species, Streptococcus agalactiae, Streptococcus pyogenes, or Streptococcus pneumoniae. CRITICAL RESULT CALLED TO, READ BACK BY AND VERIFIED WITH: VPamalee Leyden PHARMD, AT 0319 12/06/20 D. VANHOOK    Streptococcus agalactiae NOT DETECTED NOT DETECTED Final   Streptococcus pneumoniae NOT DETECTED NOT DETECTED Final   Streptococcus pyogenes NOT DETECTED NOT DETECTED Final   A.calcoaceticus-baumannii NOT DETECTED NOT DETECTED Final   Bacteroides fragilis NOT DETECTED NOT DETECTED Final   Enterobacterales NOT DETECTED NOT DETECTED Final   Enterobacter cloacae complex NOT DETECTED NOT DETECTED Final   Escherichia coli NOT DETECTED NOT DETECTED Final   Klebsiella aerogenes NOT DETECTED NOT DETECTED Final   Klebsiella oxytoca NOT DETECTED NOT DETECTED Final   Klebsiella pneumoniae NOT DETECTED NOT DETECTED Final   Proteus species NOT DETECTED NOT DETECTED Final   Salmonella species NOT DETECTED NOT DETECTED Final   Serratia marcescens NOT DETECTED NOT DETECTED Final   Haemophilus influenzae NOT DETECTED NOT DETECTED Final   Neisseria meningitidis NOT DETECTED NOT DETECTED Final   Pseudomonas aeruginosa NOT DETECTED NOT  DETECTED Final   Stenotrophomonas maltophilia NOT DETECTED NOT DETECTED Final   Candida albicans NOT DETECTED NOT DETECTED Final   Candida auris NOT DETECTED NOT DETECTED Final   Candida glabrata  NOT DETECTED NOT DETECTED Final   Candida krusei NOT DETECTED NOT DETECTED Final   Candida parapsilosis NOT DETECTED NOT DETECTED Final   Candida tropicalis NOT DETECTED NOT DETECTED Final   Cryptococcus neoformans/gattii NOT DETECTED NOT DETECTED Final    Comment: Performed at [facility], [place] [address], [place], [place] [address]  Resp Panel by RT-PCR (Flu A&B, Covid) Nasopharyngeal Swab     Status: None   Collection Time: 12/05/20  3:47 AM   Specimen: Nasopharyngeal Swab; Nasopharyngeal(NP) swabs in vial transport medium  Result Value Ref Range Status   SARS Coronavirus 2 by RT PCR NEGATIVE NEGATIVE Final    Comment: (NOTE) SARS-CoV-2 target nucleic acids are NOT DETECTED.  The SARS-CoV-2 RNA is generally detectable in upper respiratory specimens during the acute phase of infection. The lowest concentration of SARS-CoV-2 viral copies this assay can detect is 138 copies/mL. A negative result does not preclude SARS-Cov-2 infection and should not be used as the sole basis for treatment or other patient management decisions. A negative result may occur with  improper specimen collection/handling, submission of specimen other than nasopharyngeal swab, presence of viral mutation(s) within the areas targeted by this assay, and inadequate number of viral copies(<138 copies/mL). A negative result must be combined with clinical observations, patient history, and epidemiological information. The expected result is Negative.  Fact Sheet for Patients:  EntrepreneurPulse.com.au  Fact Sheet for Healthcare Providers:  IncredibleEmployment.be  This test is no t yet approved or cleared by the [place] FDA and  has been authorized for detection and/or  diagnosis of SARS-CoV-2 by FDA under an Emergency Use Authorization (EUA). This EUA will remain  in effect (meaning this test can be used) for the duration of the COVID-19 declaration under Section 564(b)(1) of the Act, 21 U.S.C.section 360bbb-3(b)(1), unless the authorization is terminated  or revoked sooner.       Influenza A by PCR NEGATIVE NEGATIVE Final   Influenza B by PCR NEGATIVE NEGATIVE Final    Comment: (NOTE) The Xpert Xpress SARS-CoV-2/FLU/RSV plus assay is intended as an aid in the diagnosis of influenza from Nasopharyngeal swab specimens and should not be used as a sole basis for treatment. Nasal washings and aspirates are unacceptable for Xpert Xpress SARS-CoV-2/FLU/RSV testing.  Fact Sheet for Patients: EntrepreneurPulse.com.au  Fact Sheet for Healthcare Providers: IncredibleEmployment.be  This test is not yet approved or cleared by the [place] FDA and has been authorized for detection and/or diagnosis of SARS-CoV-2 by FDA under an Emergency Use Authorization (EUA). This EUA will remain in effect (meaning this test can be used) for the duration of the COVID-19 declaration under Section 564(b)(1) of the Act, 21 U.S.C. section 360bbb-3(b)(1), unless the authorization is terminated or revoked.  Performed at [facility], [address], [place], [place] [address]       Studies: TEMPORAL ARTERY  Result Date: 12/06/2020  TEMPORAL ARTERY REPORT Patient Name:  DAYMOND CORDTS  Date of Exam:   12/06/2020 Medical Rec #: [id number]      Accession #:    [id number] Date of Birth: 1960-12-16      Patient Gender: M Patient Age:   85Y Exam Location:  [facility] Procedure:      VAS [place] ARTERY BILATERAL Referring Phys: [id number] JENNIFER YATES --------------------------------------------------------------------------------  Indications: Jaw pain, throat pain and scalp tenderness. High Risk Factors: Age > 50  yrs.  Performing Technologist: Maudry Mayhew MHA, RDMS, RVT, RDCS  Examination Guidelines: Patient in reclined position.  2D, color and spectral doppler sampling in the temporal artery along the hairline and temple in the longitudinal plane. 2D images along the hairline and temple in the transverse plane. Exam is bilateral.  Summary: Absence of a "halo" sign in the bilateral temporal artery, although not definitive, makes a diagnosis of temporal arteritis unlikely.  *See table(s) above for measurements and observations.  Electronically signed by Harold Barban MD on 12/06/2020 at 7:50:31 PM.   Final     Scheduled Meds:  chlorhexidine  15 mL Mouth/Throat QID   Chlorhexidine Gluconate Cloth  6 each Topical Once   clobetasol ointment   Topical BID   docusate sodium  100 mg Oral BID   enoxaparin (LOVENOX) injection  40 mg Subcutaneous E39R   folic acid  2 mg Oral Daily   gabapentin  300 mg Oral Daily   lamoTRIgine  150 mg Oral BID   latanoprost  1 drop Both Eyes QHS   mesalamine  1.2 g Oral BID   methscopolamine  5 mg Oral BID   pantoprazole  40 mg Oral BID AC   rifaximin  550 mg Oral BID   sertraline  200 mg Oral Q breakfast   sodium chloride flush  3 mL Intravenous Q12H   traZODone  150 mg Oral QHS    Continuous Infusions:  ceFEPime (MAXIPIME) IV 2 g (12/07/20 0559)   ceFEPime (MAXIPIME) IV     lactated ringers 75 mL/hr at 12/07/20 0457   potassium chloride 10 mEq (12/07/20 1026)   vancomycin     vancomycin Stopped (12/07/20 0210)     LOS: 2 days    Flora Lipps, MD [facility]  If 7PM-7AM, please contact night-coverage [email or web address] 12/07/2020, 11:25 AM

## 2020-12-07 NOTE — Transfer of Care (Signed)
Immediate Anesthesia Transfer of Care Note  Patient: [name] [name]  Procedure(s) Performed: DENTAL EXTRACTIONS OF TEETH TWENTY TO THIRTY (Mouth) INCISION AND DRAINAGE MANDIBULAR ABSCESS (Mouth) DEBRIDEMENT OF MANDIBLE (Mouth)  Patient Location: PACU  Anesthesia Type:General  Level of Consciousness: awake  Airway & Oxygen Therapy: Patient Spontanous Breathing  Post-op Assessment: Report given to RN  Post vital signs: Reviewed and stable  Last Vitals:  Vitals Value Taken Time  BP 174/80   Temp    Pulse    Resp 20   SpO2 94     Last Pain:  Vitals:   12/07/20 1456  TempSrc: Oral  PainSc: 6       Patients Stated Pain Goal: 2 (79/19/95 7900)  Complications: No notable events documented.

## 2020-12-07 NOTE — Anesthesia Postprocedure Evaluation (Signed)
Anesthesia Post Note  Patient: Sharon Seller  Procedure(s) Performed: DENTAL EXTRACTIONS OF TEETH TWENTY TO THIRTY (Mouth) INCISION AND DRAINAGE MANDIBULAR ABSCESS (Mouth) DEBRIDEMENT OF MANDIBLE (Mouth)     Patient location during evaluation: PACU Anesthesia Type: General Level of consciousness: awake and alert and patient cooperative Pain management: pain level controlled Vital Signs Assessment: post-procedure vital signs reviewed and stable Respiratory status: spontaneous breathing, nonlabored ventilation and respiratory function stable Cardiovascular status: blood pressure returned to baseline and stable Postop Assessment: no apparent nausea or vomiting Anesthetic complications: no   No notable events documented.  Last Vitals:  Vitals:   12/07/20 1456 12/07/20 1854  BP: (!) 162/83 (!) 174/80  Pulse: 72 97  Resp: 16 (!) 22  Temp: 37.1 C 37.3 C  SpO2: 96% 98%    Last Pain:  Vitals:   12/07/20 1854  TempSrc:   PainSc: 0-No pain                 Esa Raden,E. Awad Gladd

## 2020-12-07 NOTE — Plan of Care (Signed)

## 2020-12-07 NOTE — Progress Notes (Addendum)
Progress Note  ID: The patient is a 56 yoM who presented with pain and swelling of the lower right jaw.  History of Present Illness:  The patient reports having dental pain that began over a week ago. He was started on Clindamycin and saw a general dentist and was subsequently referred to an endodontist for evaluation who was concerned with pathosis area #20/21. However no obvious carious or fractured teeth could be identified and symptomatic teeth #28 and 29 were believed to be related to trigeminal neuralgia. Cone beam CT scan was taken at an imaging center on 11/27/20 and imaging was sent to me from his wife with results indicating concern for osteomyelitis originating from the LEFT mandible. Since that time the swelling/infection has increased and has spread to his right mandible and is continuing to worsen. He is having trouble eating due to it and reports dysphagia. He has a complex medical history with immunocompromised status and PCN allergy - was started on Cefepime/Vanc on admission. The patient is NPO .   Interval History 6/16 - Patient reports feeling overall about same today as yesterday. He thinks his dysphagia however is worsening and the swelling in his mouth is extending/progressing more towards the midline.      Clinical Exam: Extraoral Exam:              Patient is alert, orientated and in mild distress             CN II-XII intact except right V3 paresthesia             There is mild facial swelling of the right mandible and erythema on the right side of the face/neck.    Intraoral Exam:              The patient does not have trismus with maximum incisal opening of 50 mm.             The buccal mandibular right vestibule is now raised/firm             The floor of mouth is soft and non-elevated             There is not lateral pharyngeal swelling with the uvula midline             There is no palatal draping present.             Oral airway is patent     Gingiva is  inflamed/sloughing with multiple pustules present from teeth #27-31 both buccally and lingually as well as a lingual pustule adjacent to teeth #20/21 with appearance concerning for osteomyelitis; this gingival inflammation/pustules is now extending to midline between teeth #24/25             No dental caries or fractured teeth are noted however teeth #20-30 are mobile  #28, 29 are particularly symptomatic     Radiographic Exam:               Panorex shows no overt evidence of osteomyelitis, caries, or periapical pathosis involving the teeth.             A CT of the larynx with contrast was obtained showing no fluid collections but mild/diffuse cellulitis of the right jaw/neck. On CT there is area of left lingual mandible concerning for possible osteomyelitic process and this is better visualized on outside CBCT. Outside CBCT from 11/27/20 shows cortical perforations of the left mandible and bony sequestrum of left mandibular lingual area both suggestive  of osteomyelitis process as well as loss of cortication and anterior lingual mandible.     Assessment: Atypical mucogingival inflammatory vs infectious process of the mandibular tissues bilaterally.   -Differential Dx -- Odontogenic infection (osteomyelitis) >> inflammatory soft tissue condition (ie acute necrotizing ulcerative gingivitis, erythema multiforme, etc.). Due to outside imaging reports of CBCT and new onset development of vestibular swelling now favor likely osteomyelitis of the mandible that originated from the left mandible (unclear of origin was dental or non-dental) that has now spread to involve the anterior and right mandible as well.  Plan:   -Plan for OR today 6/16 for I&D and exploration, extraction of teeth and debridement of bone depending on clinical findings  -- Discussed plan with patient and wife; both favor more definitive approach to remove any possible involved teeth to definitively treat progressing osteomyelitis;  discussed possible removal of all mandibular teeth and mandibular debridement which patient agrees with -Consent and OR orders placed -Continue peridex and antibiotics; depending on clinical findings during surgery likley will request Infectious Disease consult for possible outpatient PICC; will plan for both hard and soft tissue bx and cultures   Risks, complications and alternatives of tooth extraction and incision and drainage procedures were discussed and questions were answered.  Among all potential risks and complications, I emphasized the potential for pain, bleeding, swelling, infection, localized alveolar osteitis (dry socket), temporary and permanent lingual and inferior alveolar nerve injury, oroantral (sinus) communication, oronasal communication, jaw fracture, damage to adjacent teeth and tissue, joint discomfort, bone/tooth fragments, recurrence of infection, need for additional procedures, facial nerve injury, scarring, limited mouth opening, drain placement, aspiration and anesthetic mishap.   Terese Door, DDS

## 2020-12-07 NOTE — Anesthesia Preprocedure Evaluation (Addendum)
Anesthesia Evaluation  Patient identified by MRN, date of birth, ID band Patient awake    Reviewed: Allergy & Precautions, NPO status , Patient's Chart, lab work & pertinent test results  History of Anesthesia Complications (+) PONV and history of anesthetic complications  Airway Mallampati: III  TM Distance: >3 FB Neck ROM: Full    Dental  (+) Dental Advisory Given   Pulmonary sleep apnea and Continuous Positive Airway Pressure Ventilation ,    Pulmonary exam normal        Cardiovascular hypertension, Normal cardiovascular exam     Neuro/Psych  Headaches, Seizures - (roughly q/monthly),  PSYCHIATRIC DISORDERS Anxiety Depression    GI/Hepatic GERD  Medicated and Controlled,(+) Cirrhosis       , Hepatitis - (NASH) Crohn's disease    Endo/Other  diabetes, Well Controlled, Type 2 K 3.1   Renal/GU negative Renal ROS     Musculoskeletal  (+) Arthritis ,   Abdominal   Peds  Hematology  (+) anemia ,  Thrombocytopenia, Plt 99k    Anesthesia Other Findings   Reproductive/Obstetrics                            Anesthesia Physical Anesthesia Plan  ASA: 3  Anesthesia Plan: General   Post-op Pain Management:    Induction: Intravenous  PONV Risk Score and Plan: 3 and Treatment may vary due to age or medical condition, Ondansetron, Dexamethasone and Midazolam  Airway Management Planned: Nasal ETT  Additional Equipment: None  Intra-op Plan:   Post-operative Plan: Extubation in OR  Informed Consent: I have reviewed the patients History and Physical, chart, labs and discussed the procedure including the risks, benefits and alternatives for the proposed anesthesia with the patient or authorized representative who has indicated his/her understanding and acceptance.     Dental advisory given  Plan Discussed with: CRNA and Anesthesiologist  Anesthesia Plan Comments:         Anesthesia Quick Evaluation

## 2020-12-08 ENCOUNTER — Encounter (HOSPITAL_COMMUNITY): Payer: Self-pay | Admitting: Oral Surgery

## 2020-12-08 ENCOUNTER — Inpatient Hospital Stay: Payer: Self-pay

## 2020-12-08 LAB — CBC WITH DIFFERENTIAL/PLATELET
Abs Immature Granulocytes: 0.04 10*3/uL (ref 0.00–0.07)
Basophils Absolute: 0 10*3/uL (ref 0.0–0.1)
Basophils Relative: 0 %
Eosinophils Absolute: 0 10*3/uL (ref 0.0–0.5)
Eosinophils Relative: 0 %
HCT: 26.4 % — ABNORMAL LOW (ref 39.0–52.0)
Hemoglobin: 9.5 g/dL — ABNORMAL LOW (ref 13.0–17.0)
Immature Granulocytes: 1 %
Lymphocytes Relative: 7 %
Lymphs Abs: 0.4 10*3/uL — ABNORMAL LOW (ref 0.7–4.0)
MCH: 38 pg — ABNORMAL HIGH (ref 26.0–34.0)
MCHC: 36 g/dL (ref 30.0–36.0)
MCV: 105.6 fL — ABNORMAL HIGH (ref 80.0–100.0)
Monocytes Absolute: 0.5 10*3/uL (ref 0.1–1.0)
Monocytes Relative: 10 %
Neutro Abs: 4.5 10*3/uL (ref 1.7–7.7)
Neutrophils Relative %: 82 %
Platelets: 131 10*3/uL — ABNORMAL LOW (ref 150–400)
RBC: 2.5 MIL/uL — ABNORMAL LOW (ref 4.22–5.81)
RDW: 14.4 % (ref 11.5–15.5)
WBC: 5.4 10*3/uL (ref 4.0–10.5)
nRBC: 0 % (ref 0.0–0.2)

## 2020-12-08 LAB — CULTURE, BLOOD (ROUTINE X 2): Special Requests: ADEQUATE

## 2020-12-08 LAB — VANCOMYCIN, PEAK: Vancomycin Pk: 35 ug/mL (ref 30–40)

## 2020-12-08 LAB — PHOSPHORUS: Phosphorus: 2.7 mg/dL (ref 2.5–4.6)

## 2020-12-08 LAB — MAGNESIUM: Magnesium: 1.9 mg/dL (ref 1.7–2.4)

## 2020-12-08 MED ORDER — ENSURE ENLIVE PO LIQD
237.0000 mL | Freq: Two times a day (BID) | ORAL | Status: DC
  Administered 2020-12-08 – 2020-12-11 (×6): 237 mL via ORAL

## 2020-12-08 MED ORDER — SODIUM CHLORIDE 0.9% FLUSH: 10.0000 mL | INTRAVENOUS | Status: DC | PRN

## 2020-12-08 MED ORDER — ADULT MULTIVITAMIN W/MINERALS CH
1.0000 | ORAL_TABLET | Freq: Every day | ORAL | Status: DC
  Administered 2020-12-08 – 2020-12-11 (×4): 1 via ORAL
  Filled 2020-12-08 (×4): qty 1

## 2020-12-08 MED ORDER — ERTAPENEM IV (FOR PTA / DISCHARGE USE ONLY): 1.0000 g | INTRAVENOUS | 0 refills | Status: AC

## 2020-12-08 MED ORDER — SODIUM CHLORIDE 0.9 % IV SOLN
1.0000 g | INTRAVENOUS | Status: DC
  Administered 2020-12-08 – 2020-12-11 (×4): 1000 mg via INTRAVENOUS
  Filled 2020-12-08 (×4): qty 1

## 2020-12-08 MED ORDER — CHLORHEXIDINE GLUCONATE CLOTH 2 % EX PADS
6.0000 | MEDICATED_PAD | Freq: Every day | CUTANEOUS | Status: DC
  Administered 2020-12-08 – 2020-12-11 (×4): 6 via TOPICAL

## 2020-12-08 MED ORDER — BOOST / RESOURCE BREEZE PO LIQD CUSTOM
1.0000 | Freq: Two times a day (BID) | ORAL | Status: DC
  Administered 2020-12-08 – 2020-12-11 (×7): 1 via ORAL

## 2020-12-08 NOTE — Progress Notes (Signed)
Initial Nutrition Assessment  DOCUMENTATION CODES:   Not applicable  INTERVENTION:   -MVI with minerals daily -Ensure Enlive po BID, each supplement provides 350 kcal and 20 grams of protein  -Boost Breeze po BID, each supplement provides 250 kcal and 9 grams of protein  -Educated on full liquid diet; provided "Full Liquid Diet Nutrition Therapy" handout from [facility] Nutrition Care Manual. Also discussed potential for diet advancement   NUTRITION DIAGNOSIS:   Increased nutrient needs related to post-op healing as evidenced by estimated needs.  Ongoing  GOAL:   Patient will meet greater than or equal to 90% of their needs  Progressing   MONITOR:   PO intake, Supplement acceptance, Diet advancement, Labs, Weight trends, Skin, I & O's  REASON FOR ASSESSMENT:   Consult Wound healing  ASSESSMENT:   [name] [name] is a 60 y.o. male with medical history significant of seizure d/o with mild cognitive impairment; NASH cirrhosis; Crohn's; depression/anxiety; DM; HTN; HLD; and OSA on CPAP presenting with R facial pain.   He was seen in the ER on 6/2 for dysphagia x 3 days with sore throat; CT was negative for deep space abscess and he was treated with Clinda and viscous lidocaine.  He returned on 6/4 for ongoing dental pain and sore throat; he was given narcotics and Decadron with plan for dental f/u within a few days.   Per family, he was seen by the dentist and there was no apparent abscess, also saw endodontist 8 days ago and there was nothing obvious other than a spot on the left side with pus. She noticed R facial swelling, difficulty speaking, trouble staying awake and so she took him to the ER.  At the ER, his fever was 103. Pt says his pain is worsening - R facial pain with radiation into the R eye and temporal area, new facial edema around the mouth.  He finished Clinda on Sunday AM.  +facial numbness.  Complaining of mildly worsening vision in his R eye with diplopia. Oral surgeon  consulted. In the ED, labs and vital signs fairly stable. Pt admitted for further management.  Pt admitted with osteomyelitis of mandible.   6/16- s/p Procedure: Surgical removal #20-30 (D7210 x 11) Intraoral I&D of vestibular abscess (Y1856) Debridement of mandible (21025) Alveoloplasty LR, LL (D7310 x 2) Removal of mandibular torus LL (D1497)  Reviewed I/O's: +189 ml x 24 hours and +1.7 L since admission  UOP: 1.5 L x 24 hours  Spoke with pt and wife at bedside. Pt complains of oral pain, however, improved from yesterday. He is consuming a melted ice pop at time of visit.   Wife reports pt with decreased oral intake over the past 2 weeks secondary to oral pain. At baseline, pt consumes about one meal per day and drinks excessive amounts of soft drinks. Over the past week, pt consumed mostly milk shakes. Pt also with history of crohn's disease and often limits dairy products due to variable tolerance.   Pt denies any weight loss. Reviewed wt hx; pt has experienced a 3.4% wt loss over the past 3 months, which is not significant for time frame.    Per dentist recommendations, pt to follow a full liquid diet for 2-3 weeks post-op with plans to advance to a mechanical soft diet afterwards. RD notified pt that he may need to follow a mechanically altered diet due to multiple teeth extractions, but diet is very dependent on tolerance. RD reviewed approved food items on full liquids  and discussed ways that pt could include more protein in his diet. Pt wife has protein powder at home with about 12-14 grams of protein, which she intends to add to almond milk and smoothies. Encouraged use of protein powder into soups or adding milk/ cream as tolerated for increased calorie and protein intake. RD also reviewed available supplements; amenable to Ensure and Boost Breeze. Teachback method used. Expect good compliance.  Medications reviewed and include colace and folic acid.   Labs reviewed: Phos and Mg  WDL. CBGS: 119 (inpatient orders for glycemic control are none).    NUTRITION - FOCUSED PHYSICAL EXAM:  Flowsheet Row Most Recent Value  Orbital Region No depletion  Upper Arm Region Mild depletion  Thoracic and Lumbar Region No depletion  Buccal Region No depletion  Temple Region No depletion  Clavicle Bone Region No depletion  Clavicle and Acromion Bone Region No depletion  Scapular Bone Region No depletion  Dorsal Hand No depletion  Patellar Region No depletion  Anterior Thigh Region No depletion  Posterior Calf Region No depletion  Edema (RD Assessment) None  Hair Reviewed  Eyes Reviewed  Mouth Reviewed  Skin Reviewed  Nails Reviewed       Diet Order:   Diet Order             Diet full liquid Room service appropriate? Yes; Fluid consistency: Thin  Diet effective now                   EDUCATION NEEDS:   Education needs have been addressed  Skin:  Skin Assessment: Skin Integrity Issues: Skin Integrity Issues:: Incisions Incisions: closed lip  Last BM:  12/05/20  Height:   Ht Readings from Last 1 Encounters:  12/04/20 5' 11"  (1.803 m)    Weight:   Wt Readings from Last 1 Encounters:  12/04/20 81.6 kg    Ideal Body Weight:  78.2 kg  BMI:  Body mass index is 25.09 kg/m.  Estimated Nutritional Needs:   Kcal:  3762-8315  Protein:  120-135 grams  Fluid:  > 2 L    Loistine Chance, RD, LDN, [place] Registered Dietitian II Certified Diabetes Care and Education Specialist Please refer to [facility] for RD and/or RD on-call/weekend/after hours pager

## 2020-12-08 NOTE — Progress Notes (Addendum)
PROGRESS NOTE  CUAHUTEMOC ATTAR RKY:[id number] DOB: 03/08/1961 DOA: 12/04/2020 PCP: [name], MD  Brief narrative  [name] [name] is a 60 y.o. male with medical history significant for seizure disorder with mild cognitive impairment, NASH cirrhosis, Crohn's disease, depression, anxiety, diabetes mellitus, hypertension, obstructive sleep apnea on CPAP presented to hospital with right facial pain.  He was seen in the ED on 11/23/2020 for dysphagia for 3 days with sore throat.  CT was negative for deep space abscess and was treated with clindamycin and viscous lidocaine.  He again came back to the hospital on 6 4 with ongoing dental pain and sore throat.  He was given narcotics and Decadron with plan for follow-up with dental surgery as outpatient.  Patient was seen by dental service but patient continued to have facial swelling difficulty speaking and the patient was brought into the hospital he also had a fever of 103 F.  Patient had been completed to clindamycin. In the ED, labs and vital signs f remained fairly stable.  He was then admitted to hospital for further evaluation and treatment.  6/17, he reports feeling better, he is still getting as needed IV morphine for pain control, appears able to tolerate full liquid diet, wife at bedside is requesting physical therapy as patient appears weaker than normal, patient has not walked for the last few weeks per report. Wife is also requesting nutrition consult   Assessment/Plan: Principal Problem:   Facial cellulitis Active Problems:   Essential hypertension   CROHN'S DISEASE, LARGE AND SMALL INTESTINES   DM2 (diabetes mellitus, type 2) (HCC)   Seizure (HCC)   OSA on CPAP   Memory loss   Facial cellulitis / Odontogenic infection /osteomyelitis of the mandible -s/p extraction of teeth #20-30, I&D and debridement of mandible on 6/16 by oral surgery Dr. Conley Simmonds -Wound culture no growth so far -Blood culture aerobic bottle only showed strep  intermedius     -Neurosurgery recommend full liquid diet, at least need 2 to 3 weeks, diet advancement per oral surgery -Peridex mouthwash 4 times daily -ID consulted per oral surgery recommendation -Nutrition consulted per wife's request   Due to elevated ESR/CRP and tender to palpation along temporal region on Presentation,  a temporal artery ultrasound was ordered after discussion with vascular surgery Dr. Oneida Alar  which showed some halo sign in the bitemporal area but unlikely to be a diagnosis of temporal arteritis.   Continue Neurontin.  Seizure disorder. Continue Lamictal   NASH cirrhosis Continue rifaximin, lactulose  Crohn's disease Continue mesalamine, methscopolamine.  Continue to hold mercaptopurine in the setting of infection  Macrocytic anemia Folate of 13, vitamin B12 605.  Slightly low iron with low TIBC.  Ferritin slightly elevated likely anemia of chronic disease.   Cognitive impairment Likely exacerbated by current illness, fever Continue eszopiclone (Ambien formulary substitution), Zoloft from home.   DM type II Previous hemoglobin A1c of 4.5.  Continue diabetic diet.  OSA Due to facial swelling, CPAP on hold.   FTT: PT eval ordered  Estimated body mass index is 25.09 kg/m as calculated from the following:   Height as of this encounter: 5' 11"  (1.803 m).   Weight as of this encounter: 81.6 kg.     Code Status: Full  Family Communication: Wife at bedside on 6/17  Disposition Plan: Status is: Inpatient  Remains inpatient appropriate because:Inpatient level of care appropriate due to severity of illness  Dispo: The patient is from: Home  Anticipated d/c is to: Home, will need home health              Likely home with home health tomorrow , need ID clearance, need PT eval, need to set up home health   Difficult to place patient No    Consultants: Oral surgeon ID  Procedures: s/p extraction of teeth #20-30, I&D and debridement of  mandible on 6/16 by oral surgery Dr. Conley Simmonds  Antimicrobials: Cefepime Vancomycin  DVT prophylaxis:  Lovenox subcu   Objective: Vitals:   12/07/20 1939 12/07/20 2008 12/08/20 0106 12/08/20 0354  BP: (!) 170/84 (!) 171/79 123/69 125/72  Pulse: 94 86 65 66  Resp: (!) 22 18 17 18   Temp: 100 F (37.8 C) 99.2 F (37.3 C) 97.7 F (36.5 C) 97.9 F (36.6 C)  TempSrc:  Oral Oral Axillary  SpO2: 94% 97% 97% 98%  Weight:      Height:        Intake/Output Summary (Last 24 hours) at 12/08/2020 0945 Last data filed at 12/08/2020 3149 Gross per 24 hour  Intake 1989.38 ml  Output 2500 ml  Net -510.62 ml   Filed Weights   12/04/20 1952  Weight: 81.6 kg    Physical exam  General:  Average built, not in obvious distress, previously documented mild facial swelling and erythema on the right side of the face has almost resolved HENT:   No scleral pallor or icterus noted. Oral mucosa is moist.  Chest:  Clear breath sounds.  Diminished breath sounds bilaterally. No crackles or wheezes.  CVS: S1 &S2 heard. No murmur.  Regular rate and rhythm. Abdomen: Soft, nontender, nondistended.  Bowel sounds are heard.   Extremities: No cyanosis, clubbing or edema.  Peripheral pulses are palpable. Psych: Alert, awake and oriented, normal mood CNS:  No cranial nerve deficits.  Power equal in all extremities.   Skin: Warm and dry.  No rashes noted.  Data Reviewed: CBC: Recent Labs  Lab 12/04/20 1958 12/06/20 0018 12/07/20 0034 12/07/20 1534 12/08/20 0232  WBC 8.5 7.2 4.2  --  5.4  NEUTROABS 6.9  --  3.1  --  4.5  HGB 12.0* 10.5* 9.6* 9.2* 9.5*  HCT 33.0* 29.2* 26.1* 27.0* 26.4*  MCV 105.1* 107.4* 105.2*  --  105.6*  PLT 123* 99* 99*  --  702*   Basic Metabolic Panel: Recent Labs  Lab 12/04/20 1958 12/04/20 2211 12/06/20 0018 12/07/20 0034 12/07/20 1534 12/08/20 0232  NA 138  --  138 139 140  --   K 3.8  --  3.6 3.1* 3.8  --   CL 105  --  107 106 107  --   CO2 23  --  22 25  --    --   GLUCOSE 134*  --  77 71 108*  --   BUN 11  --  11 10 10   --   CREATININE 0.73  --  0.72 0.71 0.50*  --   CALCIUM 8.7*  --  8.2* 8.3*  --   --   MG  --  1.5* 2.0  --   --  1.9  PHOS  --  1.9*  --   --   --  2.7   GFR: Estimated Creatinine Clearance: 105.9 mL/min (A) (by C-G formula based on SCr of 0.5 mg/dL (L)). Liver Function Tests: Recent Labs  Lab 12/04/20 1958  AST 27  ALT 19  ALKPHOS 121  BILITOT 3.1*  PROT 6.8  ALBUMIN 4.1   No  results for input(s): LIPASE, AMYLASE in the last 168 hours. Recent Labs  Lab 12/04/20 2211  AMMONIA 62*   Coagulation Profile: Recent Labs  Lab 12/04/20 1958  INR 1.3*   Cardiac Enzymes: No results for input(s): CKTOTAL, CKMB, CKMBINDEX, TROPONINI in the last 168 hours. BNP (last 3 results) No results for input(s): PROBNP in the last 8760 hours. HbA1C: No results for input(s): HGBA1C in the last 72 hours. CBG: Recent Labs  Lab 12/07/20 1852  GLUCAP 119*   Lipid Profile: No results for input(s): CHOL, HDL, LDLCALC, TRIG, CHOLHDL, LDLDIRECT in the last 72 hours. Thyroid Function Tests: No results for input(s): TSH, T4TOTAL, FREET4, T3FREE, THYROIDAB in the last 72 hours. Anemia Panel: Recent Labs    12/07/20 0034  VITAMINB12 605  FOLATE 30.6  FERRITIN 607*  TIBC 207*  IRON 35*   Urine analysis:    Component Value Date/Time   COLORURINE YELLOW 12/04/2020 2018   APPEARANCEUR CLEAR 12/04/2020 2018   APPEARANCEUR Clear 01/12/2020 0906   LABSPEC 1.018 12/04/2020 2018   PHURINE 7.5 12/04/2020 2018   GLUCOSEU NEGATIVE 12/04/2020 2018   HGBUR LARGE (A) 12/04/2020 2018   BILIRUBINUR NEGATIVE 12/04/2020 2018   BILIRUBINUR Negative 01/12/2020 0906   KETONESUR NEGATIVE 12/04/2020 2018   PROTEINUR 30 (A) 12/04/2020 2018   UROBILINOGEN 4.0 (H) 12/02/2019 1805   NITRITE NEGATIVE 12/04/2020 2018   LEUKOCYTESUR NEGATIVE 12/04/2020 2018   Sepsis Labs: @LABRCNTIP (procalcitonin:4,lacticidven:4)  ) Recent Results (from the  past 240 hour(s))  Culture, blood (Routine x 2)     Status: None (Preliminary result)   Collection Time: 12/04/20  7:58 PM   Specimen: BLOOD LEFT WRIST  Result Value Ref Range Status   Specimen Description BLOOD LEFT WRIST  Final   Special Requests   Final    BOTTLES DRAWN AEROBIC AND ANAEROBIC Blood Culture adequate volume   Culture   Final    NO GROWTH 4 DAYS Performed at [facility], [place] [address] [place], [place] [address]    Report Status PENDING  Incomplete  Culture, blood (Routine x 2)     Status: Abnormal   Collection Time: 12/04/20  8:00 PM   Specimen: BLOOD LEFT FOREARM  Result Value Ref Range Status   Specimen Description BLOOD LEFT FOREARM  Final   Special Requests   Final    BOTTLES DRAWN AEROBIC AND ANAEROBIC Blood Culture adequate volume   Culture  Setup Time   Final    GRAM POSITIVE COCCI IN CHAINS AEROBIC BOTTLE ONLY Organism ID to follow CRITICAL RESULT CALLED TO, READ BACK BY AND VERIFIED WITHAlona Bene PHARMD, AT 0319 12/06/20 [place] Performed at [facility], [place] [address], [place], [place] [address]    Culture STREPTOCOCCUS INTERMEDIUS (A)  Final   Report Status 12/08/2020 FINAL  Final   Organism ID, Bacteria STREPTOCOCCUS INTERMEDIUS  Final      Susceptibility   Streptococcus intermedius - MIC*    PENICILLIN 0.12 SENSITIVE Sensitive     CEFTRIAXONE 0.5 SENSITIVE Sensitive     ERYTHROMYCIN >=8 RESISTANT Resistant     LEVOFLOXACIN 0.5 SENSITIVE Sensitive     VANCOMYCIN 0.5 SENSITIVE Sensitive     * STREPTOCOCCUS INTERMEDIUS  Blood Culture ID Panel (Reflexed)     Status: Abnormal   Collection Time: 12/04/20  8:00 PM  Result Value Ref Range Status   Enterococcus faecalis NOT DETECTED NOT DETECTED Final   Enterococcus Faecium NOT DETECTED NOT DETECTED Final   Listeria monocytogenes NOT  DETECTED NOT DETECTED Final   Staphylococcus species NOT DETECTED NOT DETECTED Final   Staphylococcus aureus (BCID) NOT DETECTED NOT DETECTED Final    Staphylococcus epidermidis NOT DETECTED NOT DETECTED Final   Staphylococcus lugdunensis NOT DETECTED NOT DETECTED Final   Streptococcus species DETECTED (A) NOT DETECTED Final    Comment: Not Enterococcus species, Streptococcus agalactiae, Streptococcus pyogenes, or Streptococcus pneumoniae. CRITICAL RESULT CALLED TO, READ BACK BY AND VERIFIED WITH: VPamalee Leyden PHARMD, AT 0319 12/06/20 D. VANHOOK    Streptococcus agalactiae NOT DETECTED NOT DETECTED Final   Streptococcus pneumoniae NOT DETECTED NOT DETECTED Final   Streptococcus pyogenes NOT DETECTED NOT DETECTED Final   A.calcoaceticus-baumannii NOT DETECTED NOT DETECTED Final   Bacteroides fragilis NOT DETECTED NOT DETECTED Final   Enterobacterales NOT DETECTED NOT DETECTED Final   Enterobacter cloacae complex NOT DETECTED NOT DETECTED Final   Escherichia coli NOT DETECTED NOT DETECTED Final   Klebsiella aerogenes NOT DETECTED NOT DETECTED Final   Klebsiella oxytoca NOT DETECTED NOT DETECTED Final   Klebsiella pneumoniae NOT DETECTED NOT DETECTED Final   Proteus species NOT DETECTED NOT DETECTED Final   Salmonella species NOT DETECTED NOT DETECTED Final   Serratia marcescens NOT DETECTED NOT DETECTED Final   Haemophilus influenzae NOT DETECTED NOT DETECTED Final   Neisseria meningitidis NOT DETECTED NOT DETECTED Final   Pseudomonas aeruginosa NOT DETECTED NOT DETECTED Final   Stenotrophomonas maltophilia NOT DETECTED NOT DETECTED Final   Candida albicans NOT DETECTED NOT DETECTED Final   Candida auris NOT DETECTED NOT DETECTED Final   Candida glabrata NOT DETECTED NOT DETECTED Final   Candida krusei NOT DETECTED NOT DETECTED Final   Candida parapsilosis NOT DETECTED NOT DETECTED Final   Candida tropicalis NOT DETECTED NOT DETECTED Final   Cryptococcus neoformans/gattii NOT DETECTED NOT DETECTED Final    Comment: Performed at [facility], [address], [place], [place] [address]  Resp Panel by RT-PCR (Flu A&B, Covid)  Nasopharyngeal Swab     Status: None   Collection Time: 12/05/20  3:47 AM   Specimen: Nasopharyngeal Swab; Nasopharyngeal(NP) swabs in vial transport medium  Result Value Ref Range Status   SARS Coronavirus 2 by RT PCR NEGATIVE NEGATIVE Final    Comment: (NOTE) SARS-CoV-2 target nucleic acids are NOT DETECTED.  The SARS-CoV-2 RNA is generally detectable in upper respiratory specimens during the acute phase of infection. The lowest concentration of SARS-CoV-2 viral copies this assay can detect is 138 copies/mL. A negative result does not preclude SARS-Cov-2 infection and should not be used as the sole basis for treatment or other patient management decisions. A negative result may occur with  improper specimen collection/handling, submission of specimen other than nasopharyngeal swab, presence of viral mutation(s) within the areas targeted by this assay, and inadequate number of viral copies(<138 copies/mL). A negative result must be combined with clinical observations, patient history, and epidemiological information. The expected result is Negative.  Fact Sheet for Patients:  EntrepreneurPulse.com.au  Fact Sheet for Healthcare Providers:  IncredibleEmployment.be  This test is no t yet approved or cleared by the [place] FDA and  has been authorized for detection and/or diagnosis of SARS-CoV-2 by FDA under an Emergency Use Authorization (EUA). This EUA will remain  in effect (meaning this test can be used) for the duration of the COVID-19 declaration under Section 564(b)(1) of the Act, 21 U.S.C.section 360bbb-3(b)(1), unless the authorization is terminated  or revoked sooner.       Influenza A by PCR NEGATIVE NEGATIVE Final  Influenza B by PCR NEGATIVE NEGATIVE Final    Comment: (NOTE) The Xpert Xpress SARS-CoV-2/FLU/RSV plus assay is intended as an aid in the diagnosis of influenza from Nasopharyngeal swab specimens and should not be  used as a sole basis for treatment. Nasal washings and aspirates are unacceptable for Xpert Xpress SARS-CoV-2/FLU/RSV testing.  Fact Sheet for Patients: EntrepreneurPulse.com.au  Fact Sheet for Healthcare Providers: IncredibleEmployment.be  This test is not yet approved or cleared by the [place] FDA and has been authorized for detection and/or diagnosis of SARS-CoV-2 by FDA under an Emergency Use Authorization (EUA). This EUA will remain in effect (meaning this test can be used) for the duration of the COVID-19 declaration under Section 564(b)(1) of the Act, 21 U.S.C. section 360bbb-3(b)(1), unless the authorization is terminated or revoked.  Performed at [facility], [address], [place], [place] [address]   Culture, blood (routine x 2)     Status: None (Preliminary result)   Collection Time: 12/07/20 12:34 AM   Specimen: BLOOD  Result Value Ref Range Status   Specimen Description BLOOD RIGHT ARM  Final   Special Requests   Final    BOTTLES DRAWN AEROBIC AND ANAEROBIC Blood Culture adequate volume   Culture   Final    NO GROWTH 1 DAY Performed at [facility], [place] [address], [place], [place] [address]    Report Status PENDING  Incomplete  Culture, blood (routine x 2)     Status: None (Preliminary result)   Collection Time: 12/07/20 12:34 AM   Specimen: BLOOD  Result Value Ref Range Status   Specimen Description BLOOD RIGHT ARM  Final   Special Requests   Final    BOTTLES DRAWN AEROBIC AND ANAEROBIC Blood Culture adequate volume   Culture   Final    NO GROWTH 1 DAY Performed at [facility], [place] [address] [place], [place] [address]    Report Status PENDING  Incomplete  Aerobic/Anaerobic Culture w Gram Stain (surgical/deep wound)     Status: None (Preliminary result)   Collection Time: 12/07/20  4:26 PM   Specimen: PATH ENT biopsy; Tissue  Result Value Ref Range Status   Specimen  Description TISSUE  Final   Special Requests MANDIBULAR GINGIVA PT ON VAN ,CEFIPIME  Final   Gram Stain PENDING  Incomplete   Culture   Final    NO GROWTH < 12 HOURS Performed at [facility], [address] [place], [place] [address]    Report Status PENDING  Incomplete  Aerobic/Anaerobic Culture w Gram Stain (surgical/deep wound)     Status: None (Preliminary result)   Collection Time: 12/07/20  4:34 PM   Specimen: PATH Bone biopsy; Tissue  Result Value Ref Range Status   Specimen Description BONE  Final   Special Requests MANDIBULAR PT ON VANC ,CEFIPIME  Final   Gram Stain PENDING  Incomplete   Culture   Final    NO GROWTH < 12 HOURS Performed at [facility], [address], [place],  [address]    Report Status PENDING  Incomplete  Aerobic/Anaerobic Culture w Gram Stain (surgical/deep wound)     Status: None (Preliminary result)   Collection Time: 12/07/20  4:36 PM   Specimen: PATH Other; Tissue  Result Value Ref Range Status   Specimen Description ABSCESS  Final   Special Requests RT MANDIBULAR PT ON VANC,CEFIPIME  Final   Gram Stain PENDING  Incomplete   Culture   Final  NO GROWTH < 12 HOURS Performed at [place] [address], [place], [place] [address]    Report Status PENDING  Incomplete      Studies: No results found.  Scheduled Meds:  chlorhexidine  15 mL Mouth/Throat QID   clobetasol ointment   Topical BID   docusate sodium  100 mg Oral BID   enoxaparin (LOVENOX) injection  40 mg Subcutaneous Q33H   folic acid  2 mg Oral Daily   gabapentin  300 mg Oral Daily   lamoTRIgine  150 mg Oral BID   latanoprost  1 drop Both Eyes QHS   mesalamine  1.2 g Oral BID   methscopolamine  5 mg Oral BID   pantoprazole  40 mg Oral BID AC   rifaximin  550 mg Oral BID   sertraline  200 mg Oral Q breakfast   sodium chloride flush  3 mL Intravenous Q12H   traZODone  150 mg Oral QHS    Continuous Infusions:  ceFEPime (MAXIPIME) IV 2 g  (12/08/20 0553)   lactated ringers Stopped (12/07/20 0701)   vancomycin 1,250 mg (12/08/20 0034)     LOS: 3 days    Florencia Reasons, MD PhD FACP [facility]  If 7PM-7AM, please contact night-coverage [email or web address] 12/08/2020, 9:45 AM

## 2020-12-08 NOTE — Discharge Instructions (Signed)
Full Liquid Nutrition Therapy  The full liquid diet includes mostly liquids (including milk) and some foods with small amounts of fiber. The full liquid diet can provide many of the nutrients your body needs, but it may not give enough vitamins, minerals, and fiber.  A fluid is anything that is liquid or anything that would melt if left at room temperature.  You will need to count these foods and liquids--including any liquid used to take medication--as part of your daily fluid intake. Some examples are:  Coffee, tea, and other hot beverages Gelatin  Gravy Ice cream, sherbet, sorbet Ice cubes, ice chips Milk, liquid creamer Nutritional supplements Popsicles Vegetable and fruit juices; fluid in canned fruit (fruit itself is not allowed) Yogurt (without nuts, seeds, or fruit) Soft drinks, lemonade, limeade Soups (strained) Syrup This diet should only be used temporarily during your recovery until it is safe for you to eat regular foods. Your RDN can help establish a nutritionally-balanced full liquid meal plan, if needed.     Tips Determine the which foods/fluids from the list are most appealing to you. Eat or drink your favorite flavors to help you better enjoy this diet. Include milk-based or dairy-type fluids and juices. If you are lactose intolerant, choose nut and seed milks. Eat 3 full liquid meals throughout the day and include a snack time between each meal. Drink nutritional shakes in 6-8 ounce servings as part of a meal or between meals to make sure you're taking in enough calories. You can make fortified shakes for yourself or buy them premade at a store. Foods Recommended Food Group  Foods Recommended  Grains  Thin hot cereal, such as cream of wheat  Dairy  Milk: Nonfat, 1%, 2%, whole Soy milk, almond milk, rice milk, coconut milk, cashew milk Milkshakes Yogurt Custard Pudding  Vegetables  Vegetable juice with or without pulp Thin, pureed vegetable  soups  Fruits  Translucent fruit juices without pulp (apple, cranberry, grape)  Oils  Almond, avocado, canola, cashew, corn, grapeseed, olive, safflower, sesame, soybean, sunflower Butter (melted) Margarine (melted) that does not contain trans fat (read the product label)  Other  Flavored gelatin (any flavor) Strained cream soups Chicken, beef, or vegetable broths Popsicle  Beverages  Water Ice Soda Tea Coffee Nutritional supplements or shakes     A high-protein shake should contain at least 8-10 grams of protein per serving. Read product labels to find a shake that is high in protein. If you are preparing the shake at home, you can increase the amount of protein by adding protein powder, non-fat dry milk powder, yogurt, or low-fat milk.      Foods Not Recommended Food Group  Foods Not Recommended  Grains  All grain foods including whole grains, processed grains such as pasta, rice, cold cereals, bread, snacks, and sweets that are flour-based (cakes, cookies)  Protein Foods  Beef and pork (all cuts) Chicken and [place] (all cuts) Fish (all types) Nuts and nut butters (all types) Eggs (all types) All meat substitutes (such as soy and tofu) All old cuts or lunch meat (such as salami, ham) Sausage (all types)  Dairy  Hard cheese Yogurt with fruit chunks  Vegetables  Whole, frozen, fresh, canned varieties   Fruits  Whole, frozen, fresh, canned varieties   Oils  Butter Coconut oil Palm Oil Lard  Full Liquid Nutrition Therapy Sample 1-Day Menu  Breakfast 1 cup cream of wheat 1 cup yogurt (without nuts, seeds, or fruit)  cup orange juice (without pulp)  cup high-protein nutritional shake 1 cup coffee Lunch 1 cup cream of potato soup, blended and strained  cup apple juice 1 cup 1% milk 1 cup tea Evening Meal 1 cup cream of broccoli soup, blended and strained 1 cup 1% milk 1 cup high-protein nutritional shake  Copyright 2022   Academy of Nutrition and Dietetics. All rights reserved.

## 2020-12-08 NOTE — Progress Notes (Signed)
PHARMACY CONSULT NOTE FOR:  OUTPATIENT  PARENTERAL ANTIBIOTIC THERAPY (OPAT)  Indication: Mandibular osteomyelitis Regimen: Ertapenem 1 gm every 24 hours End date: 01/18/21  IV antibiotic discharge orders are pended. To discharging provider:  please sign these orders via discharge navigator,  Select New Orders & click on the button choice - Manage This Unsigned Work.     Thank you for allowing pharmacy to be a part of this patient's care.  Jimmy Footman, PharmD, BCPS, BCIDP Infectious Diseases Clinical Pharmacist Phone: [phone] 12/08/2020, 1:35 PM

## 2020-12-08 NOTE — Consult Note (Addendum)
[name] [name]       Reason for Consult:mandibular osteomyelitis    Referring Physician: Dr. Erlinda Hong  Principal Problem:   Facial cellulitis Active Problems:   Essential hypertension   CROHN'S [name], LARGE AND SMALL INTESTINES   DM2 (diabetes mellitus, type 2) (HCC)   Seizure (HCC)   OSA on CPAP   Memory loss    chlorhexidine  15 mL Mouth/Throat QID   clobetasol ointment   Topical BID   docusate sodium  100 mg Oral BID   enoxaparin (LOVENOX) injection  40 mg Subcutaneous Q24H   feeding supplement  1 Container Oral BID BM   feeding supplement  237 mL Oral BID BM   folic acid  2 mg Oral Daily   gabapentin  300 mg Oral Daily   lamoTRIgine  150 mg Oral BID   latanoprost  1 drop Both Eyes QHS   mesalamine  1.2 g Oral BID   methscopolamine  5 mg Oral BID   multivitamin with minerals  1 tablet Oral Daily   pantoprazole  40 mg Oral BID AC   rifaximin  550 mg Oral BID   sertraline  200 mg Oral Q breakfast   sodium chloride flush  3 mL Intravenous Q12H   traZODone  150 mg Oral QHS    Recommendations: Ertapenem - checking with home health if it is covered [place] line   Assessment: He has mandibular osteomyelitis now s/p operative debridement by Dr. Conley Simmonds.  Previous blood culture positive for Strep intermedius.  Likely multiple pathogens.  He will need at least 6 weeks of IV therapy and will use ertapenem once daily.  Ok from [place] standpoint for discharge once everything is in place.    Diagnosis: Mandibular osteomyelitis  Culture Result: Strep intermedius in blood culture  Allergies  Allergen Reactions   Azithromycin Swelling    SWELLING REACTION UNSPECIFIED    Beef-Derived Products Diarrhea    RED MEAT > UNSPECIFIED REACTION    Milk-Related Compounds Diarrhea    DAIRY >UNSPECIFIED REACTION    Penicillins Hives    Rash as child- tolerates Ancef/CHILDHOOD ALLERGY Has patient had a PCN reaction causing immediate rash,  facial/tongue/throat swelling, SOB or lightheadedness with hypotension: YES Has patient had a PCN reaction causing severe rash involving mucus membranes or skin necrosis: Unknown Has patient had a PCN reaction that required hospitalization: Unknown Has patient had a PCN reaction occurring within the last 10 years:Unknown If all of the above answers are "NO", then may proceed with Cephalosporin [place]   Sulfonamide Derivatives Hives   Dilaudid [Hydromorphone Hcl] Nausea And Vomiting    OPAT Orders Discharge antibiotics to be given via PICC line Discharge antibiotics: ertapenem 1 gram IV every 24 hours Per pharmacy protocol yes Duration: 6 weeks End Date: 01/18/21  [facility] Care Per Protocol: yes  Home health RN for IV administration and teaching; PICC line care and labs.    Labs weekly while on IV antibiotics: __x CBC with differential __ BMP __x CMP __x CRP __x ESR __ Vancomycin trough __ CK  __x Please pull PIC at completion of IV antibiotics __ Please leave PIC in place until doctor has seen patient or been notified  Fax weekly labs to [phone]  Clinic Follow Up Appt: 12/26/20  @ 11:15am via video visit  Antibiotics: Cefepime and vancomycin  HPI: [name] [name] is a 60 y.o. male with a history of seizure disorder, NASH, Crohn's [name], DM who has been having  dental pain and facial swelling and found operatively to have mandibular osteomyelitis, now s/p extensive debridement.  He had seen his general dentist and was sent to an endodontist and this progressed signficantly and had some somnelonce and saw his PCP who sent him to the ED. Operatively found to have pus, necrotic bone and requried signfiicant debridement and removal of teeth.  A recent blood culture was positive for Strep intermedius.  Feels better today. Wife at the bedside.    Review of Systems:  Constitutional: negative for fevers and chills Gastrointestinal: negative for nausea and diarrhea All other systems  reviewed and are negative    Past Medical History:  Diagnosis Date   Adenomatous colon polyp 10/1999   Anxiety    Arthritis    neck, yoga helps.   Cataract    Complex partial seizure ([place])    last seizure was 08-30-2018   Crohn's [name] of small and large intestines ([place]) 1999   Depression    Diabetes mellitus    no meds at this time 10-24-17   Esophageal stricture    External hemorrhoids    GERD (gastroesophageal reflux [name])    Glaucoma    History of kidney stones    3 large stones still present   Hyperlipemia    Hypertension    resolved with weight loss    Inguinal hernia, bilateral 12/2019   Liver cirrhosis secondary to NASH ([place])    Migraines    Osteoporosis    PONV (postoperative nausea and vomiting)    PTSD (post-traumatic stress disorder)    Renal cyst, acquired, left 07/08/2017   Skin cancer    squamous cell multiple; [place]; followed every 3 months.   Sleep apnea    CPAP machine, uses nightly   Staphylococcus aureus bacteremia with sepsis ([place]) 05/28/2017   MRSA 2012    Social History   Tobacco Use   Smoking status: Never   Smokeless tobacco: Never  Vaping Use   Vaping Use: Never used  Substance Use Topics   Alcohol use: No   Drug use: No    Family History  Problem Relation Age of Onset   Colon polyps Father    Stroke Father    Hyperlipidemia Father    Hypertension Father    Heart [name] Mother        CABG at age 58   Hyperlipidemia Mother    Hypertension Mother    Stroke Paternal Grandmother    Stroke Paternal Grandfather    Other Child        tetrology of fallot (cornealia deland syndrome)   Colon cancer Neg Hx    Esophageal cancer Neg Hx    Stomach cancer Neg Hx    Rectal cancer Neg Hx     Allergies  Allergen Reactions   Azithromycin Swelling    SWELLING REACTION UNSPECIFIED    Beef-Derived Products Diarrhea    RED MEAT > UNSPECIFIED REACTION    Milk-Related Compounds Diarrhea    DAIRY >UNSPECIFIED REACTION    Penicillins  Hives    Rash as child- tolerates Ancef/CHILDHOOD ALLERGY Has patient had a PCN reaction causing immediate rash, facial/tongue/throat swelling, SOB or lightheadedness with hypotension: YES Has patient had a PCN reaction causing severe rash involving mucus membranes or skin necrosis: Unknown Has patient had a PCN reaction that required hospitalization: Unknown Has patient had a PCN reaction occurring within the last 10 years:Unknown If all of the above answers are "NO", then may proceed with Cephalosporin [place]  Sulfonamide Derivatives Hives   Dilaudid [Hydromorphone Hcl] Nausea And Vomiting    Physical Exam: Constitutional: in no apparent distress  Vitals:   12/08/20 0354 12/08/20 1039  BP: 125/72 (!) 103/55  Pulse: 66 64  Resp: 18 18  Temp: 97.9 F (36.6 C) 98.2 F (36.8 C)  SpO2: 98% 99%   EYES: anicteric Cardiovascular: Cor RRR Respiratory: clear; GI: Bowel sounds are normal, liver is not enlarged, spleen is not enlarged Musculoskeletal: no pedal edema noted Skin: negatives: no rash Neuro: non-focal  Lab Results  Component Value Date   WBC 5.4 12/08/2020   HGB 9.5 (L) 12/08/2020   HCT 26.4 (L) 12/08/2020   MCV 105.6 (H) 12/08/2020   PLT 131 (L) 12/08/2020    Lab Results  Component Value Date   CREATININE 0.50 (L) 12/07/2020   BUN 10 12/07/2020   NA 140 12/07/2020   K 3.8 12/07/2020   CL 107 12/07/2020   CO2 25 12/07/2020    Lab Results  Component Value Date   ALT 19 12/04/2020   AST 27 12/04/2020   ALKPHOS 121 12/04/2020     Microbiology: Recent Results (from the past 240 hour(s))  Culture, blood (Routine x 2)     Status: None (Preliminary result)   Collection Time: 12/04/20  7:58 PM   Specimen: BLOOD LEFT WRIST  Result Value Ref Range Status   Specimen Description BLOOD LEFT WRIST  Final   Special Requests   Final    BOTTLES DRAWN AEROBIC AND ANAEROBIC Blood Culture adequate volume   Culture   Final    NO GROWTH 4 DAYS Performed at [facility], [address] [place], [place] [address]    Report Status PENDING  Incomplete  Culture, blood (Routine x 2)     Status: Abnormal   Collection Time: 12/04/20  8:00 PM   Specimen: BLOOD LEFT FOREARM  Result Value Ref Range Status   Specimen Description BLOOD LEFT FOREARM  Final   Special Requests   Final    BOTTLES DRAWN AEROBIC AND ANAEROBIC Blood Culture adequate volume   Culture  Setup Time   Final    GRAM POSITIVE COCCI IN CHAINS AEROBIC BOTTLE ONLY Organism ID to follow CRITICAL RESULT CALLED TO, READ BACK BY AND VERIFIED WITHAlona Bene PHARMD, AT 0319 12/06/20 [place] Performed at [facility], [place] [address], [place], [place] [address]    Culture STREPTOCOCCUS INTERMEDIUS (A)  Final   Report Status 12/08/2020 FINAL  Final   Organism ID, Bacteria STREPTOCOCCUS INTERMEDIUS  Final      Susceptibility   Streptococcus intermedius - MIC*    PENICILLIN 0.12 SENSITIVE Sensitive     CEFTRIAXONE 0.5 SENSITIVE Sensitive     ERYTHROMYCIN >=8 RESISTANT Resistant     LEVOFLOXACIN 0.5 SENSITIVE Sensitive     VANCOMYCIN 0.5 SENSITIVE Sensitive     * STREPTOCOCCUS INTERMEDIUS  Blood Culture ID Panel (Reflexed)     Status: Abnormal   Collection Time: 12/04/20  8:00 PM  Result Value Ref Range Status   Enterococcus faecalis NOT DETECTED NOT DETECTED Final   Enterococcus Faecium NOT DETECTED NOT DETECTED Final   Listeria monocytogenes NOT DETECTED NOT DETECTED Final   Staphylococcus species NOT DETECTED NOT DETECTED Final   Staphylococcus aureus (BCID) NOT DETECTED NOT DETECTED Final   Staphylococcus epidermidis NOT DETECTED NOT DETECTED Final   Staphylococcus lugdunensis NOT DETECTED NOT DETECTED Final   Streptococcus species DETECTED (A) NOT DETECTED Final    Comment:  Not Enterococcus species, Streptococcus agalactiae, Streptococcus pyogenes, or Streptococcus pneumoniae. CRITICAL RESULT CALLED TO, READ BACK BY AND VERIFIED WITH: VPamalee Leyden PHARMD, AT 0319 12/06/20 D.  VANHOOK    Streptococcus agalactiae NOT DETECTED NOT DETECTED Final   Streptococcus pneumoniae NOT DETECTED NOT DETECTED Final   Streptococcus pyogenes NOT DETECTED NOT DETECTED Final   A.calcoaceticus-baumannii NOT DETECTED NOT DETECTED Final   Bacteroides fragilis NOT DETECTED NOT DETECTED Final   Enterobacterales NOT DETECTED NOT DETECTED Final   Enterobacter cloacae complex NOT DETECTED NOT DETECTED Final   Escherichia coli NOT DETECTED NOT DETECTED Final   Klebsiella aerogenes NOT DETECTED NOT DETECTED Final   Klebsiella oxytoca NOT DETECTED NOT DETECTED Final   Klebsiella pneumoniae NOT DETECTED NOT DETECTED Final   Proteus species NOT DETECTED NOT DETECTED Final   Salmonella species NOT DETECTED NOT DETECTED Final   Serratia marcescens NOT DETECTED NOT DETECTED Final   Haemophilus influenzae NOT DETECTED NOT DETECTED Final   Neisseria meningitidis NOT DETECTED NOT DETECTED Final   Pseudomonas aeruginosa NOT DETECTED NOT DETECTED Final   Stenotrophomonas maltophilia NOT DETECTED NOT DETECTED Final   Candida albicans NOT DETECTED NOT DETECTED Final   Candida auris NOT DETECTED NOT DETECTED Final   Candida glabrata NOT DETECTED NOT DETECTED Final   Candida krusei NOT DETECTED NOT DETECTED Final   Candida parapsilosis NOT DETECTED NOT DETECTED Final   Candida tropicalis NOT DETECTED NOT DETECTED Final   Cryptococcus neoformans/gattii NOT DETECTED NOT DETECTED Final    Comment: Performed at [facility], [address] [place], [place] [address]  Resp Panel by RT-PCR (Flu A&B, Covid) Nasopharyngeal Swab     Status: None   Collection Time: 12/05/20  3:47 AM   Specimen: Nasopharyngeal Swab; Nasopharyngeal(NP) swabs in vial transport medium  Result Value Ref Range Status   SARS Coronavirus 2 by RT PCR NEGATIVE NEGATIVE Final    Comment: (NOTE) SARS-CoV-2 target nucleic acids are NOT DETECTED.  The SARS-CoV-2 RNA is generally detectable in upper respiratory specimens during  the acute phase of infection. The lowest concentration of SARS-CoV-2 viral copies this assay can detect is 138 copies/mL. A negative result does not preclude SARS-Cov-2 infection and should not be used as the sole basis for treatment or other patient management decisions. A negative result may occur with  improper specimen collection/handling, submission of specimen other than nasopharyngeal swab, presence of viral mutation(s) within the areas targeted by this assay, and inadequate number of viral copies(<138 copies/mL). A negative result must be combined with clinical observations, patient history, and epidemiological information. The expected result is Negative.  Fact Sheet for Patients:  EntrepreneurPulse.com.au  Fact Sheet for Healthcare Providers:  IncredibleEmployment.be  This test is no t yet approved or cleared by the [place] FDA and  has been authorized for detection and/or diagnosis of SARS-CoV-2 by FDA under an Emergency Use Authorization (EUA). This EUA will remain  in effect (meaning this test can be used) for the duration of the COVID-19 declaration under Section 564(b)(1) of the Act, 21 U.S.C.section 360bbb-3(b)(1), unless the authorization is terminated  or revoked sooner.       Influenza A by PCR NEGATIVE NEGATIVE Final   Influenza B by PCR NEGATIVE NEGATIVE Final    Comment: (NOTE) The Xpert Xpress SARS-CoV-2/FLU/RSV plus assay is intended as an aid in the diagnosis of influenza from Nasopharyngeal swab specimens and should not be used as a sole basis for treatment. Nasal washings and aspirates are unacceptable for Xpert Xpress SARS-CoV-2/FLU/RSV  testing.  Fact Sheet for Patients: EntrepreneurPulse.com.au  Fact Sheet for Healthcare Providers: IncredibleEmployment.be  This test is not yet approved or cleared by the [place] FDA and has been authorized for detection and/or  diagnosis of SARS-CoV-2 by FDA under an Emergency Use Authorization (EUA). This EUA will remain in effect (meaning this test can be used) for the duration of the COVID-19 declaration under Section 564(b)(1) of the Act, 21 U.S.C. section 360bbb-3(b)(1), unless the authorization is terminated or revoked.  Performed at [facility], [address] [place], [place] [address]   Culture, blood (routine x 2)     Status: None (Preliminary result)   Collection Time: 12/07/20 12:34 AM   Specimen: BLOOD  Result Value Ref Range Status   Specimen Description BLOOD RIGHT ARM  Final   Special Requests   Final    BOTTLES DRAWN AEROBIC AND ANAEROBIC Blood Culture adequate volume   Culture   Final    NO GROWTH 1 DAY Performed at [facility], [place] [address], [place], [place] [address]    Report Status PENDING  Incomplete  Culture, blood (routine x 2)     Status: None (Preliminary result)   Collection Time: 12/07/20 12:34 AM   Specimen: BLOOD  Result Value Ref Range Status   Specimen Description BLOOD RIGHT ARM  Final   Special Requests   Final    BOTTLES DRAWN AEROBIC AND ANAEROBIC Blood Culture adequate volume   Culture   Final    NO GROWTH 1 DAY Performed at [facility], [place] [address], [place], [place] [address]    Report Status PENDING  Incomplete  Aerobic/Anaerobic Culture w Gram Stain (surgical/deep wound)     Status: None (Preliminary result)   Collection Time: 12/07/20  4:26 PM   Specimen: PATH ENT biopsy; Tissue  Result Value Ref Range Status   Specimen Description TISSUE  Final   Special Requests MANDIBULAR GINGIVA PT ON VAN ,CEFIPIME  Final   Gram Stain NO WBC SEEN NO ORGANISMS SEEN   Final   Culture   Final    NO GROWTH < 12 HOURS Performed at [facility], [address] [place], [place] [address]    Report Status PENDING  Incomplete  Aerobic/Anaerobic Culture w Gram Stain (surgical/deep wound)     Status: None (Preliminary result)    Collection Time: 12/07/20  4:34 PM   Specimen: PATH Bone biopsy; Tissue  Result Value Ref Range Status   Specimen Description BONE  Final   Special Requests MANDIBULAR PT ON VANC ,CEFIPIME  Final   Gram Stain NO WBC SEEN NO ORGANISMS SEEN   Final   Culture   Final    NO GROWTH < 12 HOURS Performed at [facility], [address] [place], [place] [address]    Report Status PENDING  Incomplete  Aerobic/Anaerobic Culture w Gram Stain (surgical/deep wound)     Status: None (Preliminary result)   Collection Time: 12/07/20  4:36 PM   Specimen: PATH Other; Tissue  Result Value Ref Range Status   Specimen Description ABSCESS  Final   Special Requests RT MANDIBULAR PT ON VANC,CEFIPIME  Final   Gram Stain NO WBC SEEN NO ORGANISMS SEEN   Final   Culture   Final    NO GROWTH < 12 HOURS Performed at [facility], [place] [address], [place], [place] [address]    Report Status PENDING  Incomplete    Thayer Headings, MD  [place] [name] [email or web address] 12/08/2020, 12:06 PM

## 2020-12-08 NOTE — Progress Notes (Signed)
Peripherally Inserted Central Catheter Placement  The IV Nurse has discussed with the patient and/or persons authorized to consent for the patient, the purpose of this procedure and the potential benefits and risks involved with this procedure.  The benefits include less needle sticks, lab draws from the catheter, and the patient may be discharged home with the catheter. Risks include, but not limited to, infection, bleeding, blood clot (thrombus formation), and puncture of an artery; nerve damage and irregular heartbeat and possibility to perform a PICC exchange if needed/ordered by physician.  Alternatives to this procedure were also discussed.  Bard Power PICC patient education guide, fact sheet on infection prevention and patient information card has been provided to patient /or left at bedside.    PICC Placement Documentation  PICC Single Lumen 02/77/41 Right Basilic 42 cm 0 cm (Active)  Indication for Insertion or Continuance of Line Prolonged intravenous therapies;Home intravenous therapies (PICC only) 12/08/20 1336  Exposed Catheter (cm) 0 cm 12/08/20 1336  Site Assessment Clean;Dry;Intact 12/08/20 1336  Line Status Flushed;Saline locked;Blood return noted 12/08/20 1336  Dressing Type Transparent 12/08/20 1336  Dressing Status Clean;Dry;Intact 12/08/20 1336  Antimicrobial disc in place? Yes 12/08/20 1336  Safety Lock Not Applicable 28/78/67 6720  Line Care Connections checked and tightened 12/08/20 1336  Line Adjustment (NICU/IV Team Only) No 12/08/20 1336  Dressing Intervention New dressing 12/08/20 1336  Dressing Change Due 12/15/20 12/08/20 [place], Nicolette Bang 12/08/2020, 1:37 PM

## 2020-12-08 NOTE — Progress Notes (Signed)
Progress Note  ID: The patient is a 41 yoM who presented with pain and swelling of the lower right jaw.  History of Present Illness:  The patient reports having dental pain that began over a week ago. He was started on Clindamycin and saw a general dentist and was subsequently referred to an endodontist for evaluation who was concerned with pathosis area #20/21. However no obvious carious or fractured teeth could be identified and symptomatic teeth #28 and 29 were believed to be related to trigeminal neuralgia. Cone beam CT scan was taken at an imaging center on 11/27/20 and imaging was sent to me from his wife with results indicating concern for osteomyelitis originating from the LEFT mandible. Since that time the swelling/infection has increased and has spread to his right mandible and is continuing to worsen. He is having trouble eating due to it and reports dysphagia. He has a complex medical history with immunocompromised status and PCN allergy - was started on Cefepime/Vanc on admission. He was taken to the OR on 12/07/20 and diagnosis of osteomyelitis was confirmed -- he is s/p removal of teeth #20-30 and debridement of mandibular osteomyelitis. ID has been consulted for PICC line and outpatient IV antibiotics.   Interval History 6/16 - Patient reports feeling overall about same today as yesterday. He thinks his dysphagia however is worsening and the swelling in his mouth is extending/progressing more towards the midline.  6/17 - Patient reports feeling much better this morning after surgery last night and pain is much improved. Denies bleeding issues last night. Reports continued right V3 paresthesia that is about the same as baseline.    Clinical Exam: Extraoral Exam:              Patient is alert, orientated and in mild distress             CN II-XII intact except right V3 paresthesia             There is mild post-operative edema, pre-op cellulitis/erythema is improved.   Intraoral Exam:               The patient does not have trismus with maximum incisal opening of 50 mm.             The floor of mouth is soft and non-elevated             There is not lateral pharyngeal swelling with the uvula midline             There is no palatal draping present.             Oral airway is patent   Incisions are closed, sutures are intact and surgical sites are hemostatic; no purulence can be expressed from the surgical site; mild vestibular postoperative edema   Teeth #18, 19, and 31 are non-mobile     Radiographic Exam:               Panorex shows no overt evidence of osteomyelitis, caries, or periapical pathosis involving the teeth.             A CT of the larynx with contrast was obtained showing no fluid collections but mild/diffuse cellulitis of the right jaw/neck. On CT there is area of left lingual mandible concerning for possible osteomyelitic process and this is better visualized on outside CBCT. Outside CBCT from 11/27/20 shows cortical perforations of the left mandible and bony sequestrum of left mandibular lingual area both suggestive of osteomyelitis process  as well as loss of cortication and anterior lingual mandible.     Assessment: Mandibular osteomyelitis with resulting facial cellulitis; infection was of quick onset and very aggressive given clinical picture and surgical findings. Pt is now s/p extraction of teeth #20-30, I&D and debridement of mandible and improving clinically.  Plan:   OMFS Recommendations -Full cup liquid diet; will plan for liquid diet for 2-3 weeks until soft tissues have healed at which time he can advance to soft diet -Peridex (chlorhexidine) mouthrnise QID -Continue Cefepime/Vanc for now; recommend ID consult and PICC for outpatient antibiotics; once PICC is in place patient is able to d/c from OMFS perspective -Will follow patient while he is inpatient; follow up on results from cultures and pathology specimens -Daily CBCs      Terese Door,  DDS

## 2020-12-08 NOTE — TOC Initial Note (Signed)
Transition of Care [facility]) - Initial/Assessment Note    Patient Details  Name: [name] [name] MRN: [id number] Date of Birth: 1960-09-11  Transition of [facility]) CM/SW Contact:    Verdell Carmine, RN Phone Number: 12/08/2020, 11:32 AM  Clinical Narrative:                   60 Yo admitted with dental pain, facial cellulitis, osteomyelitis jaw. To OR last night for teeth extraction, I&D and debridement of mandible. Will need PICC for outpatient antibiotic therapy. ID consulted. Spoke to [employer] from [employer] for Infusion therapy. They will need to come in and educate patient and family before he discharges. They will enlist the assistance of their liaisons for [facility] RN to help with infusions.  MD to do OPAT for medications. CM to follow  Expected Discharge Plan: [place] Barriers to Discharge: Continued Medical Work up   Patient Goals and CMS Choice        Expected Discharge Plan and Services Expected Discharge Plan: [place]   Discharge Planning Services: CM Consult Post Acute Care Choice: [place] arrangements for the past 2 months: Single Family Home                           HH Arranged: RN [place] Agency:  Personnel officer RN through [employer] for IV ABX) Date HH Agency Contacted: 12/08/20 Time [place]: 1131 Representative spoke with at [place]: VIa PAm at [employer]  Prior Living Arrangements/Services Living arrangements for the past 2 months: [place] with:: Spouse Patient language and need for interpreter reviewed:: Yes Do you feel safe going back to the place where you live?: Yes      Need for Family Participation in Patient Care: Yes (Comment) Care giver support system in place?: Yes (comment)   Criminal Activity/Legal Involvement Pertinent to Current Situation/Hospitalization: No - Comment as needed  Activities of Daily Living      Permission Sought/Granted                  Emotional  Assessment       Orientation: : Oriented to Self, Oriented to Place, Oriented to  Time Alcohol / Substance Use: Not Applicable Psych Involvement: No (comment)  Admission diagnosis:  Shortness of breath [R06.02] Facial pain [R51.9] SIRS (systemic inflammatory response syndrome) ([place]) [R65.10] Sepsis ([place]) [A41.9] Fever, unspecified fever cause [R50.9] Patient Active Problem List   Diagnosis Date Noted   Facial cellulitis 12/05/2020   S/P inguinal hernia repair 01/14/2020   At high risk for falls    S/P shoulder surgery 03/05/2018   Excessive daytime sleepiness 02/18/2018   Crohn's disease of colon with rectal bleeding ([place]) 02/18/2018   Blood in stool 02/18/2018   Iron deficiency anemia due to chronic blood loss 02/18/2018   Iron deficiency anemia due to sideropenic dysphagia 02/18/2018   Renal calculus, left 02/02/2018   Fatigue 12/16/2017   History of sepsis 12/16/2017   Therapeutic drug monitoring 10/07/2017   Renal cyst, acquired, left 07/08/2017   Frequent falls 07/08/2017   Staphylococcus aureus bacteremia with sepsis ([place]) 05/28/2017   Renal lesion 05/28/2017   Bacteremia due to Staphylococcus aureus 05/06/2017   MSSA bacteremia 05/06/2017   Confusion 04/02/2017   Syncope 08/30/2016   Depression 04/30/2016   Memory loss 04/30/2016   OSA on CPAP 10/31/2015   Nonepileptic episode ([place]) 04/25/2015   Seizure ([place])  04/04/2013   Abnormal EKG 03/12/2013   DOE (dyspnea on exertion) 03/12/2013   Complex partial seizure ([place]) 04/19/2012   DM2 (diabetes mellitus, type 2) ([place]) 09/25/2011   Altered mental status 09/25/2011   OTHER DYSPHAGIA 03/27/2009   TRANSAMINASES, SERUM, ELEVATED 03/27/2009   Cough 11/10/2008   GERD 12/07/2007   COLONIC POLYPS, ADENOMATOUS, HX OF 12/07/2007   EXTERNAL HEMORRHOIDS 09/11/2007   CROHN'S DISEASE, LARGE AND SMALL INTESTINES 09/11/2007   OSTEOPOROSIS 09/11/2007   HYPERLIPIDEMIA NEC/NOS 02/27/2007   Essential hypertension 02/27/2007    PCP:  [name], MD Pharmacy:   [facility] [place] AT [address] to [address] [employer] [address] [place] [address] Phone: [phone] Fax: [phone]  [id number] [address] [id number]: [phone]: [phone]    Social Determinants of Health (SDOH) Interventions    Readmission Risk Interventions No flowsheet data found.

## 2020-12-09 LAB — CBC WITH DIFFERENTIAL/PLATELET
Abs Immature Granulocytes: 0.04 10*3/uL (ref 0.00–0.07)
Basophils Absolute: 0 10*3/uL (ref 0.0–0.1)
Basophils Relative: 1 %
Eosinophils Absolute: 0.1 10*3/uL (ref 0.0–0.5)
Eosinophils Relative: 1 %
HCT: 23.5 % — ABNORMAL LOW (ref 39.0–52.0)
Hemoglobin: 8.2 g/dL — ABNORMAL LOW (ref 13.0–17.0)
Immature Granulocytes: 1 %
Lymphocytes Relative: 19 %
Lymphs Abs: 0.7 10*3/uL (ref 0.7–4.0)
MCH: 38.1 pg — ABNORMAL HIGH (ref 26.0–34.0)
MCHC: 34.9 g/dL (ref 30.0–36.0)
MCV: 109.3 fL — ABNORMAL HIGH (ref 80.0–100.0)
Monocytes Absolute: 0.4 10*3/uL (ref 0.1–1.0)
Monocytes Relative: 12 %
Neutro Abs: 2.5 10*3/uL (ref 1.7–7.7)
Neutrophils Relative %: 66 %
Platelets: 121 10*3/uL — ABNORMAL LOW (ref 150–400)
RBC: 2.15 MIL/uL — ABNORMAL LOW (ref 4.22–5.81)
RDW: 14.9 % (ref 11.5–15.5)
WBC: 3.8 10*3/uL — ABNORMAL LOW (ref 4.0–10.5)
nRBC: 0.5 % — ABNORMAL HIGH (ref 0.0–0.2)

## 2020-12-09 LAB — BASIC METABOLIC PANEL
Anion gap: 7 (ref 5–15)
BUN: 12 mg/dL (ref 6–20)
CO2: 27 mmol/L (ref 22–32)
Calcium: 8.4 mg/dL — ABNORMAL LOW (ref 8.9–10.3)
Chloride: 107 mmol/L (ref 98–111)
Creatinine, Ser: 0.78 mg/dL (ref 0.61–1.24)
GFR, Estimated: >60 mL/min (ref >60)
Glucose, Bld: 85 mg/dL (ref 70–99)
Potassium: 3.4 mmol/L — ABNORMAL LOW (ref 3.5–5.1)
Sodium: 141 mmol/L (ref 135–145)

## 2020-12-09 LAB — POCT I-STAT, CHEM 8
BUN: 10 mg/dL (ref 6–20)
Calcium, Ion: 1.2 mmol/L (ref 1.15–1.40)
Chloride: 106 mmol/L (ref 98–111)
Creatinine, Ser: 0.5 mg/dL — ABNORMAL LOW (ref 0.61–1.24)
Glucose, Bld: 126 mg/dL — ABNORMAL HIGH (ref 70–99)
HCT: 26 % — ABNORMAL LOW (ref 39.0–52.0)
Hemoglobin: 8.8 g/dL — ABNORMAL LOW (ref 13.0–17.0)
Potassium: 4.1 mmol/L (ref 3.5–5.1)
Sodium: 139 mmol/L (ref 135–145)
TCO2: 24 mmol/L (ref 22–32)

## 2020-12-09 LAB — CULTURE, BLOOD (ROUTINE X 2)
Culture: NO GROWTH
Special Requests: ADEQUATE

## 2020-12-09 LAB — MAGNESIUM: Magnesium: 1.8 mg/dL (ref 1.7–2.4)

## 2020-12-09 MED ORDER — WHITE PETROLATUM EX OINT
TOPICAL_OINTMENT | CUTANEOUS | Status: AC
  Filled 2020-12-09: qty 28.35

## 2020-12-09 NOTE — Progress Notes (Signed)
Progress Note  ID: The patient is a 54 yoM who presented with pain and swelling of the lower right jaw.  History of Present Illness:  The patient reports having dental pain that began over a week ago. He was started on Clindamycin and saw a general dentist and was subsequently referred to an endodontist for evaluation who was concerned with pathosis area #20/21. However no obvious carious or fractured teeth could be identified and symptomatic teeth #28 and 29 were believed to be related to trigeminal neuralgia. Cone beam CT scan was taken at an imaging center on 11/27/20 and imaging was sent to me from his wife with results indicating concern for osteomyelitis originating from the LEFT mandible. Since that time the swelling/infection has increased and has spread to his right mandible and is continuing to worsen. He is having trouble eating due to it and reports dysphagia. He has a complex medical history with immunocompromised status and PCN allergy - was started on Cefepime/Vanc on admission. He was taken to the OR on 12/07/20 and diagnosis of osteomyelitis was confirmed -- he is s/p removal of teeth #20-30 and debridement of mandibular osteomyelitis. ID has been consulted for PICC line and outpatient IV antibiotics.   Interval History 6/16 - Patient reports feeling overall about same today as yesterday. He thinks his dysphagia however is worsening and the swelling in his mouth is extending/progressing more towards the midline.  6/17 - Patient reports feeling much better this morning after surgery last night and pain is much improved. Denies bleeding issues last night. Reports continued right V3 paresthesia that is about the same as baseline. 6/18 - Patient reports feeling a little worse today overall. Jaw pain is about the same. VAS 6-7. AF. Anemic and thrombocytopenic.    Clinical Exam: Extraoral Exam:              Patient is alert, orientated and in mild distress             CN II-XII intact except  right V3 paresthesia             There is mild post-operative edema, pre-op cellulitis/erythema is improved.   Intraoral Exam:              The patient does not have trismus with maximum incisal opening of 50 mm.             The floor of mouth is soft and non-elevated             There is not lateral pharyngeal swelling with the uvula midline             There is no palatal draping present.             Oral airway is patent   Incisions are closed, sutures are intact and surgical sites are hemostatic; no purulence can be expressed from the surgical site; mild vestibular postoperative edema; slightly increased edema in anterior mandibular vestibule, yellowish/fibrin present along incision lines no purulence can be expressed still  Teeth #18, 19, and 31 are non-mobile     Radiographic Exam:               Panorex shows no overt evidence of osteomyelitis, caries, or periapical pathosis involving the teeth.             A CT of the larynx with contrast was obtained showing no fluid collections but mild/diffuse cellulitis of the right jaw/neck. On CT there is area of left  lingual mandible concerning for possible osteomyelitic process and this is better visualized on outside CBCT. Outside CBCT from 11/27/20 shows cortical perforations of the left mandible and bony sequestrum of left mandibular lingual area both suggestive of osteomyelitis process as well as loss of cortication and anterior lingual mandible.     Assessment: Mandibular osteomyelitis with resulting facial cellulitis; infection was of quick onset and very aggressive given clinical picture and surgical findings. Pt is now s/p extraction of teeth #20-30, I&D and debridement of mandible and improving clinically.  Plan:   OMFS Recommendations -Full cup liquid diet; will plan for liquid diet for 2-3 weeks until soft tissues have healed at which time he can advance to soft diet -Peridex (chlorhexidine) mouthrnise QID -PICC In place; agree with  Ertapenem x 6 weeks appreciate ID recs -Will follow patient while he is inpatient; follow up on results from cultures and pathology specimens -Daily CBCs  -Do not recommend discharge at this time; keep inpatient until we see more clinical improvement     Terese Door, DDS

## 2020-12-09 NOTE — Evaluation (Signed)
Physical Therapy Evaluation Patient Details Name: [name] [name] MRN: [id number] DOB: 02-01-1961 Today's Date: 12/09/2020   History of Present Illness  Patient was admitted to the hospital with facail cellulitus following tooth removal. He has been having progressive weakness over the past few weeks per patients wife.PMH: neck pain,seizure disorder, PTSD, HTN, kidney disease;  Clinical Impression  Patient required min a to correct balance 1x with gait. He otherwise required guarding. His wife has been helping him with mobility at home. He would benefit from skilled therapy to improve gait distance end endurance. He would also benefit from home health to continue to improve strength and endurance.     Follow Up Recommendations Home health PT    Equipment Recommendations       Recommendations for Other Services       Precautions / Restrictions Precautions Precautions: None Restrictions Weight Bearing Restrictions: No      Mobility  Bed Mobility Overal bed mobility: Independent             General bed mobility comments: able to sit up at the edge of the bed without assist    Transfers Overall transfer level: Needs assistance Equipment used: Rolling walker (2 wheeled) Transfers: Sit to/from Stand Sit to Stand: Supervision         General transfer comment: supervision for intial balance  Ambulation/Gait Ambulation/Gait assistance: Min guard Gait Distance (Feet): 15 Feet Assistive device: Rolling walker (2 wheeled) Gait Pattern/deviations: Step-through pattern Gait velocity: decreased Gait velocity interpretation: <1.31 ft/sec, indicative of household ambulator General Gait Details: Patient had one incedence of LOB in which PT had to correct his balance  Stairs            Wheelchair Mobility    Modified Rankin (Stroke Patients Only)       Balance Overall balance assessment: Needs assistance Sitting-balance support: No upper extremity  supported Sitting balance-Leahy Scale: Good     Standing balance support: Bilateral upper extremity supported Standing balance-Leahy Scale: Good Standing balance comment: needs walker for balance                             Pertinent Vitals/Pain Pain Assessment: Faces Faces Pain Scale: Hurts little more Pain Location: left jaw Pain Descriptors / Indicators: Aching Pain Intervention(s): Limited activity within patient's tolerance    Home Living Family/patient expects to be discharged to:: Private residence Living Arrangements: Spouse/significant other   Type of Home: House Home Access: Stairs to enter Entrance Stairs-Rails: None Technical brewer of Steps: 2 Home Layout: Two level   Additional Comments: handrail in the bathroom    Prior Function Level of Independence: Independent         Comments: walking     Hand Dominance   Dominant Hand: Right    Extremity/Trunk Assessment   Upper Extremity Assessment Upper Extremity Assessment: Overall WFL for tasks assessed    Lower Extremity Assessment Lower Extremity Assessment: Generalized weakness    Cervical / Trunk Assessment Cervical / Trunk Assessment: Normal  Communication   Communication: No difficulties  Cognition Arousal/Alertness: Awake/alert Behavior During Therapy: WFL for tasks assessed/performed Overall Cognitive Status: Within Functional Limits for tasks assessed                                        General Comments      Exercises  Assessment/Plan    PT Assessment Patient needs continued PT services  PT Problem List Decreased range of motion;Decreased strength;Decreased activity tolerance;Decreased balance;Decreased mobility;Decreased knowledge of use of DME;Pain       PT Treatment Interventions DME instruction;Gait training;Stair training;Functional mobility training;Therapeutic activities;Therapeutic exercise;Neuromuscular  re-education;Patient/family education    PT Goals (Current goals can be found in the Care Plan section)  Acute Rehab PT Goals Patient Stated Goal: to get stronger PT Goal Formulation: With patient Time For Goal Achievement: 12/16/20 Potential to Achieve Goals: Good    Frequency Min 3X/week   Barriers to discharge        Co-evaluation               AM-PAC PT "6 Clicks" Mobility  Outcome Measure Help needed turning from your back to your side while in a flat bed without using bedrails?: A Little Help needed moving from lying on your back to sitting on the side of a flat bed without using bedrails?: A Little Help needed moving to and from a bed to a chair (including a wheelchair)?: A Little Help needed standing up from a chair using your arms (e.g., wheelchair or bedside chair)?: A Little Help needed to walk in hospital room?: A Little Help needed climbing 3-5 steps with a railing? : A Little 6 Click Score: 18    End of Session Equipment Utilized During Treatment: Gait belt Activity Tolerance: Patient limited by fatigue Patient left: in chair;with call bell/phone within reach;with family/visitor present;with chair alarm set Nurse Communication: Mobility status PT Visit Diagnosis: Unsteadiness on feet (R26.81);Muscle weakness (generalized) (M62.81)    Time: 1100-1130 PT Time Calculation (min) (ACUTE ONLY): 30 min   Charges:   PT Evaluation $PT Eval Moderate Complexity: 1 Mod           Carney Living PT DPT  12/09/2020, 11:59 AM

## 2020-12-09 NOTE — Progress Notes (Addendum)
PROGRESS NOTE  [name] [name] LKG:[id number] DOB: 11-Nov-1960 DOA: 12/04/2020 PCP: [name], MD  HPI/Recap of past 24 hours: This is a 60 year old male with history of seizure disorder, cognitive impairment [name] cirrhosis Crohn's disease depression anxiety diabetes mellitus hypertension obstructive sleep apnea on CPAP who presented to the emergency room with right facial pain initially presented on June 2 with facial pain and dysphagia and sore throat CT scan was negative for any deep space abscess and was treated with clindamycin and viscous lidocaine and discharged home he came back on November 25, 2020 with dental pain and sore throat to follow-up with outpatient but then he started to have fevers of 103 with continued facial pain and difficulty speaking despite completing his antibiotics he was therefore admitted for further evaluation dental evaluation and consult was obtained.  Patient seen and examined at bedside his wife is at bedside.  He is tolerating clear liquids.  He is complaining he still has pain in the face but is much better than when he came in he has not had any fever.  Assessment/Plan: Principal Problem:   Facial cellulitis Active Problems:   Essential hypertension   CROHN'S DISEASE, LARGE AND SMALL INTESTINES   DM2 (diabetes mellitus, type 2) (HCC)   Seizure (HCC)   OSA on CPAP   Memory loss   1.  Cellulitis of the left face continue full liquids continue Peridex mouthwash.  Infectious disease has been consulted by dental.  Dental want him to stay inpatient and not to be discharged yet Blood cultures negative x5 days Deep wound culture rare positive Streptococcus intermedius Continue IV antibiotics  2.  Seizure disorder stable continue Lamictal  3.  Not cirrhosis continue rifaximin and lactulose  4.  Macrocytic anemia.  His hemoglobin today is 8.2 on admission was 10 and yesterday was 9.8  5.  Type 2 diabetes mellitus controlled on diet  6.  Obstructive sleep  apnea patient was on CPAP but this is on hold due to facial swelling  Code Status: Full  Severity of Illness: The appropriate patient status for this patient is INPATIENT. Inpatient status is judged to be reasonable and necessary in order to provide the required intensity of service to ensure the patient's safety. The patient's presenting symptoms, physical exam findings, and initial radiographic and laboratory data in the context of their chronic comorbidities is felt to place them at high risk for further clinical deterioration. Furthermore, it is not anticipated that the patient will be medically stable for discharge from the hospital within 2 midnights of admission. The following factors support the patient status of inpatient.   " Will continue to require inpatient treatment with IV antibiotics * I certify that at the point of admission it is my clinical judgment that the patient will require inpatient hospital care spanning beyond 2 midnights from the point of admission due to high intensity of service, high risk for further deterioration and high frequency of surveillance required.*   Family Communication: Wife [name] at bedside   Disposition Plan: In 1 to 2 days depending on dentist recommendation   Consultants: Oral surgeon  Procedures: Tooth extraction #20-30 and debridement of mandible on December 07, 2020 by oral surgeon diabetes  Antimicrobials: Invanz  DVT prophylaxis: Lovenox   Objective: Vitals:   12/08/20 0354 12/08/20 1039 12/08/20 2106 12/09/20 0358  BP: 125/72 (!) 103/55 115/63 129/73  Pulse: 66 64 68 69  Resp: 18 18 16 17   Temp: 97.9 F (36.6 C) 98.2 F (36.8  C) 98.2 F (36.8 C) 98.2 F (36.8 C)  TempSrc: Axillary Oral Oral Oral  SpO2: 98% 99% 97% 97%  Weight:      Height:        Intake/Output Summary (Last 24 hours) at 12/09/2020 1107 Last data filed at 12/09/2020 0900 Gross per 24 hour  Intake 340 ml  Output 2900 ml  Net -2560 ml   Filed Weights    12/04/20 1952  Weight: 81.6 kg   Body mass index is 25.09 kg/m.  Exam:  General: 60 y.o. year-old male well developed well nourished in no acute distress.  Alert and oriented x3. HEENT.  He has some facial cellulitis particularly on the right there is no obvious redness.  There is a area of induration in the right jaw Cardiovascular: Regular rate and rhythm with no rubs or gallops.  No thyromegaly or JVD noted.   Respiratory: Clear to auscultation with no wheezes or rales. Good inspiratory effort. Abdomen: Soft nontender nondistended with normal bowel sounds x4 quadrants. Musculoskeletal: No lower extremity edema. 2/4 pulses in all 4 extremities. Skin: No ulcerative lesions noted or rashes, Psychiatry: Mood is appropriate for condition and setting    Data Reviewed: CBC: Recent Labs  Lab 12/04/20 1958 12/06/20 0018 12/07/20 0034 12/07/20 1534 12/07/20 1759 12/08/20 0232 12/09/20 0337  WBC 8.5 7.2 4.2  --   --  5.4 3.8*  NEUTROABS 6.9  --  3.1  --   --  4.5 2.5  HGB 12.0* 10.5* 9.6* 9.2* 8.8* 9.5* 8.2*  HCT 33.0* 29.2* 26.1* 27.0* 26.0* 26.4* 23.5*  MCV 105.1* 107.4* 105.2*  --   --  105.6* 109.3*  PLT 123* 99* 99*  --   --  131* 478*   Basic Metabolic Panel: Recent Labs  Lab 12/04/20 1958 12/04/20 2211 12/06/20 0018 12/07/20 0034 12/07/20 1534 12/07/20 1759 12/08/20 0232 12/09/20 0337  NA 138  --  138 139 140 139  --  141  K 3.8  --  3.6 3.1* 3.8 4.1  --  3.4*  CL 105  --  107 106 107 106  --  107  CO2 23  --  22 25  --   --   --  27  GLUCOSE 134*  --  77 71 108* 126*  --  85  BUN 11  --  11 10 10 10   --  12  CREATININE 0.73  --  0.72 0.71 0.50* 0.50*  --  0.78  CALCIUM 8.7*  --  8.2* 8.3*  --   --   --  8.4*  MG  --  1.5* 2.0  --   --   --  1.9 1.8  PHOS  --  1.9*  --   --   --   --  2.7  --    GFR: Estimated Creatinine Clearance: 105.9 mL/min (by C-G formula based on SCr of 0.78 mg/dL). Liver Function Tests: Recent Labs  Lab 12/04/20 1958  AST 27   ALT 19  ALKPHOS 121  BILITOT 3.1*  PROT 6.8  ALBUMIN 4.1   No results for input(s): LIPASE, AMYLASE in the last 168 hours. Recent Labs  Lab 12/04/20 2211  AMMONIA 62*   Coagulation Profile: Recent Labs  Lab 12/04/20 1958  INR 1.3*   Cardiac Enzymes: No results for input(s): CKTOTAL, CKMB, CKMBINDEX, TROPONINI in the last 168 hours. BNP (last 3 results) No results for input(s): PROBNP in the last 8760 hours. HbA1C: No results for input(s): HGBA1C in  the last 72 hours. CBG: Recent Labs  Lab 12/07/20 1852  GLUCAP 119*   Lipid Profile: No results for input(s): CHOL, HDL, LDLCALC, TRIG, CHOLHDL, LDLDIRECT in the last 72 hours. Thyroid Function Tests: No results for input(s): TSH, T4TOTAL, FREET4, T3FREE, THYROIDAB in the last 72 hours. Anemia Panel: Recent Labs    12/07/20 0034  VITAMINB12 605  FOLATE 30.6  FERRITIN 607*  TIBC 207*  IRON 35*   Urine analysis:    Component Value Date/Time   COLORURINE YELLOW 12/04/2020 2018   APPEARANCEUR CLEAR 12/04/2020 2018   APPEARANCEUR Clear 01/12/2020 0906   LABSPEC 1.018 12/04/2020 2018   PHURINE 7.5 12/04/2020 2018   GLUCOSEU NEGATIVE 12/04/2020 2018   HGBUR LARGE (A) 12/04/2020 2018   BILIRUBINUR NEGATIVE 12/04/2020 2018   BILIRUBINUR Negative 01/12/2020 0906   KETONESUR NEGATIVE 12/04/2020 2018   PROTEINUR 30 (A) 12/04/2020 2018   UROBILINOGEN 4.0 (H) 12/02/2019 1805   NITRITE NEGATIVE 12/04/2020 2018   LEUKOCYTESUR NEGATIVE 12/04/2020 2018   Sepsis Labs: @LABRCNTIP (procalcitonin:4,lacticidven:4)  ) Recent Results (from the past 240 hour(s))  Culture, blood (Routine x 2)     Status: None (Preliminary result)   Collection Time: 12/04/20  7:58 PM   Specimen: BLOOD LEFT WRIST  Result Value Ref Range Status   Specimen Description BLOOD LEFT WRIST  Final   Special Requests   Final    BOTTLES DRAWN AEROBIC AND ANAEROBIC Blood Culture adequate volume   Culture   Final    NO GROWTH 4 DAYS Performed at [facility], [place] [address], [place], [place] [address]    Report Status PENDING  Incomplete  Culture, blood (Routine x 2)     Status: Abnormal   Collection Time: 12/04/20  8:00 PM   Specimen: BLOOD LEFT FOREARM  Result Value Ref Range Status   Specimen Description BLOOD LEFT FOREARM  Final   Special Requests   Final    BOTTLES DRAWN AEROBIC AND ANAEROBIC Blood Culture adequate volume   Culture  Setup Time   Final    GRAM POSITIVE COCCI IN CHAINS AEROBIC BOTTLE ONLY Organism ID to follow CRITICAL RESULT CALLED TO, READ BACK BY AND VERIFIED WITHAlona Bene PHARMD, AT 0319 12/06/20 [place] Performed at [facility], [place] [address] [place], [place] [address]    Culture STREPTOCOCCUS INTERMEDIUS (A)  Final   Report Status 12/08/2020 FINAL  Final   Organism ID, Bacteria STREPTOCOCCUS INTERMEDIUS  Final      Susceptibility   Streptococcus intermedius - MIC*    PENICILLIN 0.12 SENSITIVE Sensitive     CEFTRIAXONE 0.5 SENSITIVE Sensitive     ERYTHROMYCIN >=8 RESISTANT Resistant     LEVOFLOXACIN 0.5 SENSITIVE Sensitive     VANCOMYCIN 0.5 SENSITIVE Sensitive     * STREPTOCOCCUS INTERMEDIUS  Blood Culture ID Panel (Reflexed)     Status: Abnormal   Collection Time: 12/04/20  8:00 PM  Result Value Ref Range Status   Enterococcus faecalis NOT DETECTED NOT DETECTED Final   Enterococcus Faecium NOT DETECTED NOT DETECTED Final   Listeria monocytogenes NOT DETECTED NOT DETECTED Final   Staphylococcus species NOT DETECTED NOT DETECTED Final   Staphylococcus aureus (BCID) NOT DETECTED NOT DETECTED Final   Staphylococcus epidermidis NOT DETECTED NOT DETECTED Final   Staphylococcus lugdunensis NOT DETECTED NOT DETECTED Final   Streptococcus species DETECTED (A) NOT DETECTED Final    Comment: Not Enterococcus species, Streptococcus agalactiae, Streptococcus pyogenes, or Streptococcus pneumoniae. CRITICAL RESULT CALLED TO, READ BACK  BY AND VERIFIED WITH: V. Somerset, AT 0319 12/06/20 D.  VANHOOK    Streptococcus agalactiae NOT DETECTED NOT DETECTED Final   Streptococcus pneumoniae NOT DETECTED NOT DETECTED Final   Streptococcus pyogenes NOT DETECTED NOT DETECTED Final   A.calcoaceticus-baumannii NOT DETECTED NOT DETECTED Final   Bacteroides fragilis NOT DETECTED NOT DETECTED Final   Enterobacterales NOT DETECTED NOT DETECTED Final   Enterobacter cloacae complex NOT DETECTED NOT DETECTED Final   Escherichia coli NOT DETECTED NOT DETECTED Final   Klebsiella aerogenes NOT DETECTED NOT DETECTED Final   Klebsiella oxytoca NOT DETECTED NOT DETECTED Final   Klebsiella pneumoniae NOT DETECTED NOT DETECTED Final   Proteus species NOT DETECTED NOT DETECTED Final   Salmonella species NOT DETECTED NOT DETECTED Final   Serratia marcescens NOT DETECTED NOT DETECTED Final   Haemophilus influenzae NOT DETECTED NOT DETECTED Final   Neisseria meningitidis NOT DETECTED NOT DETECTED Final   Pseudomonas aeruginosa NOT DETECTED NOT DETECTED Final   Stenotrophomonas maltophilia NOT DETECTED NOT DETECTED Final   Candida albicans NOT DETECTED NOT DETECTED Final   Candida auris NOT DETECTED NOT DETECTED Final   Candida glabrata NOT DETECTED NOT DETECTED Final   Candida krusei NOT DETECTED NOT DETECTED Final   Candida parapsilosis NOT DETECTED NOT DETECTED Final   Candida tropicalis NOT DETECTED NOT DETECTED Final   Cryptococcus neoformans/gattii NOT DETECTED NOT DETECTED Final    Comment: Performed at [facility], [address] [place], [place] [address]  Resp Panel by RT-PCR (Flu A&B, Covid) Nasopharyngeal Swab     Status: None   Collection Time: 12/05/20  3:47 AM   Specimen: Nasopharyngeal Swab; Nasopharyngeal(NP) swabs in vial transport medium  Result Value Ref Range Status   SARS Coronavirus 2 by RT PCR NEGATIVE NEGATIVE Final    Comment: (NOTE) SARS-CoV-2 target nucleic acids are NOT DETECTED.  The SARS-CoV-2 RNA is generally detectable in upper respiratory specimens during  the acute phase of infection. The lowest concentration of SARS-CoV-2 viral copies this assay can detect is 138 copies/mL. A negative result does not preclude SARS-Cov-2 infection and should not be used as the sole basis for treatment or other patient management decisions. A negative result may occur with  improper specimen collection/handling, submission of specimen other than nasopharyngeal swab, presence of viral mutation(s) within the areas targeted by this assay, and inadequate number of viral copies(<138 copies/mL). A negative result must be combined with clinical observations, patient history, and epidemiological information. The expected result is Negative.  Fact Sheet for Patients:  EntrepreneurPulse.com.au  Fact Sheet for Healthcare Providers:  IncredibleEmployment.be  This test is no t yet approved or cleared by the [place] FDA and  has been authorized for detection and/or diagnosis of SARS-CoV-2 by FDA under an Emergency Use Authorization (EUA). This EUA will remain  in effect (meaning this test can be used) for the duration of the COVID-19 declaration under Section 564(b)(1) of the Act, 21 U.S.C.section 360bbb-3(b)(1), unless the authorization is terminated  or revoked sooner.       Influenza A by PCR NEGATIVE NEGATIVE Final   Influenza B by PCR NEGATIVE NEGATIVE Final    Comment: (NOTE) The Xpert Xpress SARS-CoV-2/FLU/RSV plus assay is intended as an aid in the diagnosis of influenza from Nasopharyngeal swab specimens and should not be used as a sole basis for treatment. Nasal washings and aspirates are unacceptable for Xpert Xpress SARS-CoV-2/FLU/RSV testing.  Fact Sheet for Patients: EntrepreneurPulse.com.au  Fact Sheet for Healthcare Providers: IncredibleEmployment.be  This  test is not yet approved or cleared by the [place] and has been authorized for detection and/or  diagnosis of SARS-CoV-2 by FDA under an Emergency Use Authorization (EUA). This EUA will remain in effect (meaning this test can be used) for the duration of the COVID-19 declaration under Section 564(b)(1) of the Act, 21 U.S.C. section 360bbb-3(b)(1), unless the authorization is terminated or revoked.  Performed at [facility], [address] [place], [place] [address]   Culture, blood (routine x 2)     Status: None (Preliminary result)   Collection Time: 12/07/20 12:34 AM   Specimen: BLOOD  Result Value Ref Range Status   Specimen Description BLOOD RIGHT ARM  Final   Special Requests   Final    BOTTLES DRAWN AEROBIC AND ANAEROBIC Blood Culture adequate volume   Culture   Final    NO GROWTH 1 DAY Performed at [facility], [place] [address], [place], [place] [address]    Report Status PENDING  Incomplete  Culture, blood (routine x 2)     Status: None (Preliminary result)   Collection Time: 12/07/20 12:34 AM   Specimen: BLOOD  Result Value Ref Range Status   Specimen Description BLOOD RIGHT ARM  Final   Special Requests   Final    BOTTLES DRAWN AEROBIC AND ANAEROBIC Blood Culture adequate volume   Culture   Final    NO GROWTH 1 DAY Performed at [facility], [place] [address], [place], [place] [address]    Report Status PENDING  Incomplete  Aerobic/Anaerobic Culture w Gram Stain (surgical/deep wound)     Status: None (Preliminary result)   Collection Time: 12/07/20  4:26 PM   Specimen: PATH ENT biopsy; Tissue  Result Value Ref Range Status   Specimen Description TISSUE  Final   Special Requests MANDIBULAR GINGIVA PT ON VAN ,CEFIPIME  Final   Gram Stain NO WBC SEEN NO ORGANISMS SEEN   Final   Culture   Final    NO GROWTH < 12 HOURS Performed at [facility], [address], [place], [place] [address]    Report Status PENDING  Incomplete  Aerobic/Anaerobic Culture w Gram Stain (surgical/deep wound)     Status: None (Preliminary result)    Collection Time: 12/07/20  4:34 PM   Specimen: PATH Bone biopsy; Tissue  Result Value Ref Range Status   Specimen Description BONE  Final   Special Requests MANDIBULAR PT ON VANC ,CEFIPIME  Final   Gram Stain NO WBC SEEN NO ORGANISMS SEEN   Final   Culture   Final    NO GROWTH < 12 HOURS Performed at [facility], [address], [place], [place] [address]    Report Status PENDING  Incomplete  Aerobic/Anaerobic Culture w Gram Stain (surgical/deep wound)     Status: None (Preliminary result)   Collection Time: 12/07/20  4:36 PM   Specimen: PATH Other; Tissue  Result Value Ref Range Status   Specimen Description ABSCESS  Final   Special Requests RT MANDIBULAR PT ON VANC,CEFIPIME  Final   Gram Stain NO WBC SEEN NO ORGANISMS SEEN   Final   Culture   Final    NO GROWTH < 12 HOURS Performed at [facility], [place] [address], [place], [place] [address]    Report Status PENDING  Incomplete      Studies: No results found.  Scheduled Meds:  chlorhexidine  15 mL Mouth/Throat QID   Chlorhexidine Gluconate  Cloth  6 each Topical Daily   clobetasol ointment   Topical BID   docusate sodium  100 mg Oral BID   enoxaparin (LOVENOX) injection  40 mg Subcutaneous Q24H   feeding supplement  1 Container Oral BID BM   feeding supplement  237 mL Oral BID BM   folic acid  2 mg Oral Daily   gabapentin  300 mg Oral Daily   lamoTRIgine  150 mg Oral BID   latanoprost  1 drop Both Eyes QHS   mesalamine  1.2 g Oral BID   methscopolamine  5 mg Oral BID   multivitamin with minerals  1 tablet Oral Daily   pantoprazole  40 mg Oral BID AC   rifaximin  550 mg Oral BID   sertraline  200 mg Oral Q breakfast   sodium chloride flush  3 mL Intravenous Q12H   traZODone  150 mg Oral QHS    Continuous Infusions:  ertapenem Stopped (12/08/20 1431)     LOS: 4 days     Cristal Deer, MD [facility]  To reach me or the doctor on call, go to: [email or web address] Password  [facility]  12/09/2020, 11:07 AM

## 2020-12-10 LAB — CBC WITH DIFFERENTIAL/PLATELET
Abs Immature Granulocytes: 0.06 10*3/uL (ref 0.00–0.07)
Basophils Absolute: 0 10*3/uL (ref 0.0–0.1)
Basophils Relative: 1 %
Eosinophils Absolute: 0 10*3/uL (ref 0.0–0.5)
Eosinophils Relative: 1 %
HCT: 25.3 % — ABNORMAL LOW (ref 39.0–52.0)
Hemoglobin: 8.9 g/dL — ABNORMAL LOW (ref 13.0–17.0)
Immature Granulocytes: 2 %
Lymphocytes Relative: 10 %
Lymphs Abs: 0.3 10*3/uL — ABNORMAL LOW (ref 0.7–4.0)
MCH: 37.9 pg — ABNORMAL HIGH (ref 26.0–34.0)
MCHC: 35.2 g/dL (ref 30.0–36.0)
MCV: 107.7 fL — ABNORMAL HIGH (ref 80.0–100.0)
Monocytes Absolute: 0.3 10*3/uL (ref 0.1–1.0)
Monocytes Relative: 9 %
Neutro Abs: 2.5 10*3/uL (ref 1.7–7.7)
Neutrophils Relative %: 77 %
Platelets: 117 10*3/uL — ABNORMAL LOW (ref 150–400)
RBC: 2.35 MIL/uL — ABNORMAL LOW (ref 4.22–5.81)
RDW: 15.5 % (ref 11.5–15.5)
WBC: 3.2 10*3/uL — ABNORMAL LOW (ref 4.0–10.5)
nRBC: 0.9 % — ABNORMAL HIGH (ref 0.0–0.2)

## 2020-12-10 MED ORDER — MENTHOL 3 MG MT LOZG
1.0000 | LOZENGE | OROMUCOSAL | Status: DC | PRN
  Administered 2020-12-11: 3 mg via ORAL
  Filled 2020-12-10: qty 9

## 2020-12-10 NOTE — Progress Notes (Addendum)
PROGRESS NOTE  [name] [name] JSE:[id number] DOB: 01/14/1961 DOA: 12/04/2020 PCP: [name], MD  HPI/Recap of past 24 hours: This is a 60 year old male with history of seizure disorder, cognitive impairment [name] cirrhosis Crohn's disease depression anxiety diabetes mellitus hypertension obstructive sleep apnea on CPAP who presented to the emergency room with right facial pain initially presented on June 2 with facial pain and dysphagia and sore throat CT scan was negative for any deep space abscess and was treated with clindamycin and viscous lidocaine and discharged home he came back on November 25, 2020 with dental pain and sore throat to follow-up with outpatient but then he started to have fevers of 103 with continued facial pain and difficulty speaking despite completing his antibiotics he was therefore admitted for further evaluation dental evaluation and consult was obtained.  Patient seen and examined at bedside his wife is at bedside.  He is tolerating clear liquids.  He is complaining he still has pain in the face but is much better than when he came in he has not had any fever.  UPDATE 04/12/2019 23rd Patient seen and examined at bedside .  Patient is complaining that his right face seems a little more swollen  and painful and also he started to have sore throat again like before  Assessment/Plan: Principal Problem:   Facial cellulitis Active Problems:   Essential hypertension   CROHN'S DISEASE, LARGE AND SMALL INTESTINES   DM2 (diabetes mellitus, type 2) (HCC)   Seizure ([place])   OSA on CPAP   Memory loss   1.  Cellulitis of the left face continue full liquids continue Peridex mouthwash.  Infectious disease has been consulted by dental.  Dental want him to stay inpatient and not to be discharged yet Blood cultures negative x5 days Deep wound culture rare positive Streptococcus intermedius  .wbc is normal Continue IV antibiotics  2.  Seizure disorder stable continue  Lamictal  3.  Not cirrhosis continue rifaximin and lactulose  4.  Macrocytic anemia.  His hemoglobin today is 8.2 on admission was 10 and yesterday was 9.8  5.  Type 2 diabetes mellitus controlled on diet  6.  Obstructive sleep apnea patient was on CPAP but this is on hold due to facial swelling  7.  Sore throat/dysphagia.  We will start him on Cepacol lozenges  Code Status: Full  Severity of Illness: The appropriate patient status for this patient is INPATIENT. Inpatient status is judged to be reasonable and necessary in order to provide the required intensity of service to ensure the patient's safety. The patient's presenting symptoms, physical exam findings, and initial radiographic and laboratory data in the context of their chronic comorbidities is felt to place them at high risk for further clinical deterioration. Furthermore, it is not anticipated that the patient will be medically stable for discharge from the hospital within 2 midnights of admission. The following factors support the patient status of inpatient.   " Will continue to require inpatient treatment with IV antibiotics ORAL SURGEON RECOMMENDS TO KEEP HIM INPT   * I certify that at the point of admission it is my clinical judgment that the patient will require inpatient hospital care spanning beyond 2 midnights from the point of admission due to high intensity of service, high risk for further deterioration and high frequency of surveillance required.*   Family Communication: Wife [name] at bedside   Disposition Plan: In 1 to 2 days depending on dentist recommendation   Consultants: Oral surgeon  Procedures:  Tooth extraction #20-30 and debridement of mandible on December 07, 2020 by oral surgeon diabetes  Antimicrobials: Invanz  DVT prophylaxis: Lovenox   Objective: Vitals:   12/08/20 2106 12/09/20 0358 12/09/20 1554 12/10/20 0356  BP: 115/63 129/73 137/78 130/80  Pulse: 68 69 70 70  Resp: 16 17 18 17    Temp: 98.2 F (36.8 C) 98.2 F (36.8 C) 97.8 F (36.6 C) 97.6 F (36.4 C)  TempSrc: Oral Oral Oral Oral  SpO2: 97% 97% 98% 96%  Weight:      Height:        Intake/Output Summary (Last 24 hours) at 12/10/2020 1056 Last data filed at 12/10/2020 0353 Gross per 24 hour  Intake --  Output 1601 ml  Net -1601 ml    Filed Weights   12/04/20 1952  Weight: 81.6 kg   Body mass index is 25.09 kg/m.  Exam:  General: 60 y.o. year-old male well developed well nourished in no acute distress.  Alert and oriented x3. HEENT.  He has some facial cellulitis particularly on the right there is no obvious redness.  There is a area of induration in the right jaw Cardiovascular: Regular rate and rhythm with no rubs or gallops.  No thyromegaly or JVD noted.   Respiratory: Clear to auscultation with no wheezes or rales. Good inspiratory effort. Abdomen: Soft nontender nondistended with normal bowel sounds x4 quadrants. Musculoskeletal: No lower extremity edema. 2/4 pulses in all 4 extremities. Skin: No ulcerative lesions noted or rashes, Psychiatry: Mood is appropriate for condition and setting    Data Reviewed: CBC: Recent Labs  Lab 12/04/20 1958 12/06/20 0018 12/07/20 0034 12/07/20 1534 12/07/20 1759 12/08/20 0232 12/09/20 0337 12/10/20 0342  WBC 8.5 7.2 4.2  --   --  5.4 3.8* 3.2*  NEUTROABS 6.9  --  3.1  --   --  4.5 2.5 2.5  HGB 12.0* 10.5* 9.6* 9.2* 8.8* 9.5* 8.2* 8.9*  HCT 33.0* 29.2* 26.1* 27.0* 26.0* 26.4* 23.5* 25.3*  MCV 105.1* 107.4* 105.2*  --   --  105.6* 109.3* 107.7*  PLT 123* 99* 99*  --   --  131* 121* 117*    Basic Metabolic Panel: Recent Labs  Lab 12/04/20 1958 12/04/20 2211 12/06/20 0018 12/07/20 0034 12/07/20 1534 12/07/20 1759 12/08/20 0232 12/09/20 0337  NA 138  --  138 139 140 139  --  141  K 3.8  --  3.6 3.1* 3.8 4.1  --  3.4*  CL 105  --  107 106 107 106  --  107  CO2 23  --  22 25  --   --   --  27  GLUCOSE 134*  --  77 71 108* 126*  --  85   BUN 11  --  11 10 10 10   --  12  CREATININE 0.73  --  0.72 0.71 0.50* 0.50*  --  0.78  CALCIUM 8.7*  --  8.2* 8.3*  --   --   --  8.4*  MG  --  1.5* 2.0  --   --   --  1.9 1.8  PHOS  --  1.9*  --   --   --   --  2.7  --     GFR: Estimated Creatinine Clearance: 105.9 mL/min (by C-G formula based on SCr of 0.78 mg/dL). Liver Function Tests: Recent Labs  Lab 12/04/20 1958  AST 27  ALT 19  ALKPHOS 121  BILITOT 3.1*  PROT 6.8  ALBUMIN 4.1  No results for input(s): LIPASE, AMYLASE in the last 168 hours. Recent Labs  Lab 12/04/20 2211  AMMONIA 62*    Coagulation Profile: Recent Labs  Lab 12/04/20 1958  INR 1.3*    Cardiac Enzymes: No results for input(s): CKTOTAL, CKMB, CKMBINDEX, TROPONINI in the last 168 hours. BNP (last 3 results) No results for input(s): PROBNP in the last 8760 hours. HbA1C: No results for input(s): HGBA1C in the last 72 hours. CBG: Recent Labs  Lab 12/07/20 1852  GLUCAP 119*    Lipid Profile: No results for input(s): CHOL, HDL, LDLCALC, TRIG, CHOLHDL, LDLDIRECT in the last 72 hours. Thyroid Function Tests: No results for input(s): TSH, T4TOTAL, FREET4, T3FREE, THYROIDAB in the last 72 hours. Anemia Panel: No results for input(s): VITAMINB12, FOLATE, FERRITIN, TIBC, IRON, RETICCTPCT in the last 72 hours.  Urine analysis:    Component Value Date/Time   COLORURINE YELLOW 12/04/2020 2018   APPEARANCEUR CLEAR 12/04/2020 2018   APPEARANCEUR Clear 01/12/2020 0906   LABSPEC 1.018 12/04/2020 2018   PHURINE 7.5 12/04/2020 2018   GLUCOSEU NEGATIVE 12/04/2020 2018   HGBUR LARGE (A) 12/04/2020 2018   BILIRUBINUR NEGATIVE 12/04/2020 2018   BILIRUBINUR Negative 01/12/2020 0906   KETONESUR NEGATIVE 12/04/2020 2018   PROTEINUR 30 (A) 12/04/2020 2018   UROBILINOGEN 4.0 (H) 12/02/2019 1805   NITRITE NEGATIVE 12/04/2020 2018   LEUKOCYTESUR NEGATIVE 12/04/2020 2018   Sepsis Labs: @LABRCNTIP (procalcitonin:4,lacticidven:4)  ) Recent Results  (from the past 240 hour(s))  Culture, blood (Routine x 2)     Status: None   Collection Time: 12/04/20  7:58 PM   Specimen: BLOOD LEFT WRIST  Result Value Ref Range Status   Specimen Description BLOOD LEFT WRIST  Final   Special Requests   Final    BOTTLES DRAWN AEROBIC AND ANAEROBIC Blood Culture adequate volume   Culture   Final    NO GROWTH 5 DAYS Performed at [facility], [place] [address], [place], [place] [address]    Report Status 12/09/2020 FINAL  Final  Culture, blood (Routine x 2)     Status: Abnormal   Collection Time: 12/04/20  8:00 PM   Specimen: BLOOD LEFT FOREARM  Result Value Ref Range Status   Specimen Description BLOOD LEFT FOREARM  Final   Special Requests   Final    BOTTLES DRAWN AEROBIC AND ANAEROBIC Blood Culture adequate volume   Culture  Setup Time   Final    GRAM POSITIVE COCCI IN CHAINS AEROBIC BOTTLE ONLY Organism ID to follow CRITICAL RESULT CALLED TO, READ BACK BY AND VERIFIED WITHAlona Bene PHARMD, AT 0319 12/06/20 [place] Performed at [facility], [place] [address], [place], [place] [address]    Culture STREPTOCOCCUS INTERMEDIUS (A)  Final   Report Status 12/08/2020 FINAL  Final   Organism ID, Bacteria STREPTOCOCCUS INTERMEDIUS  Final      Susceptibility   Streptococcus intermedius - MIC*    PENICILLIN 0.12 SENSITIVE Sensitive     CEFTRIAXONE 0.5 SENSITIVE Sensitive     ERYTHROMYCIN >=8 RESISTANT Resistant     LEVOFLOXACIN 0.5 SENSITIVE Sensitive     VANCOMYCIN 0.5 SENSITIVE Sensitive     * STREPTOCOCCUS INTERMEDIUS  Blood Culture ID Panel (Reflexed)     Status: Abnormal   Collection Time: 12/04/20  8:00 PM  Result Value Ref Range Status   Enterococcus faecalis NOT DETECTED NOT DETECTED Final   Enterococcus Faecium NOT DETECTED NOT DETECTED Final   Listeria monocytogenes NOT DETECTED NOT DETECTED Final  Staphylococcus species NOT DETECTED NOT DETECTED Final   Staphylococcus aureus (BCID) NOT DETECTED NOT DETECTED Final    Staphylococcus epidermidis NOT DETECTED NOT DETECTED Final   Staphylococcus lugdunensis NOT DETECTED NOT DETECTED Final   Streptococcus species DETECTED (A) NOT DETECTED Final    Comment: Not Enterococcus species, Streptococcus agalactiae, Streptococcus pyogenes, or Streptococcus pneumoniae. CRITICAL RESULT CALLED TO, READ BACK BY AND VERIFIED WITH: VPamalee Leyden PHARMD, AT 0319 12/06/20 D. VANHOOK    Streptococcus agalactiae NOT DETECTED NOT DETECTED Final   Streptococcus pneumoniae NOT DETECTED NOT DETECTED Final   Streptococcus pyogenes NOT DETECTED NOT DETECTED Final   A.calcoaceticus-baumannii NOT DETECTED NOT DETECTED Final   Bacteroides fragilis NOT DETECTED NOT DETECTED Final   Enterobacterales NOT DETECTED NOT DETECTED Final   Enterobacter cloacae complex NOT DETECTED NOT DETECTED Final   Escherichia coli NOT DETECTED NOT DETECTED Final   Klebsiella aerogenes NOT DETECTED NOT DETECTED Final   Klebsiella oxytoca NOT DETECTED NOT DETECTED Final   Klebsiella pneumoniae NOT DETECTED NOT DETECTED Final   Proteus species NOT DETECTED NOT DETECTED Final   Salmonella species NOT DETECTED NOT DETECTED Final   Serratia marcescens NOT DETECTED NOT DETECTED Final   Haemophilus influenzae NOT DETECTED NOT DETECTED Final   Neisseria meningitidis NOT DETECTED NOT DETECTED Final   Pseudomonas aeruginosa NOT DETECTED NOT DETECTED Final   Stenotrophomonas maltophilia NOT DETECTED NOT DETECTED Final   Candida albicans NOT DETECTED NOT DETECTED Final   Candida auris NOT DETECTED NOT DETECTED Final   Candida glabrata NOT DETECTED NOT DETECTED Final   Candida krusei NOT DETECTED NOT DETECTED Final   Candida parapsilosis NOT DETECTED NOT DETECTED Final   Candida tropicalis NOT DETECTED NOT DETECTED Final   Cryptococcus neoformans/gattii NOT DETECTED NOT DETECTED Final    Comment: Performed at [facility], [address] [place], [place] [address]  Resp Panel by RT-PCR (Flu A&B, Covid)  Nasopharyngeal Swab     Status: None   Collection Time: 12/05/20  3:47 AM   Specimen: Nasopharyngeal Swab; Nasopharyngeal(NP) swabs in vial transport medium  Result Value Ref Range Status   SARS Coronavirus 2 by RT PCR NEGATIVE NEGATIVE Final    Comment: (NOTE) SARS-CoV-2 target nucleic acids are NOT DETECTED.  The SARS-CoV-2 RNA is generally detectable in upper respiratory specimens during the acute phase of infection. The lowest concentration of SARS-CoV-2 viral copies this assay can detect is 138 copies/mL. A negative result does not preclude SARS-Cov-2 infection and should not be used as the sole basis for treatment or other patient management decisions. A negative result may occur with  improper specimen collection/handling, submission of specimen other than nasopharyngeal swab, presence of viral mutation(s) within the areas targeted by this assay, and inadequate number of viral copies(<138 copies/mL). A negative result must be combined with clinical observations, patient history, and epidemiological information. The expected result is Negative.  Fact Sheet for Patients:  EntrepreneurPulse.com.au  Fact Sheet for Healthcare Providers:  IncredibleEmployment.be  This test is no t yet approved or cleared by the [place] FDA and  has been authorized for detection and/or diagnosis of SARS-CoV-2 by FDA under an Emergency Use Authorization (EUA). This EUA will remain  in effect (meaning this test can be used) for the duration of the COVID-19 declaration under Section 564(b)(1) of the Act, 21 U.S.C.section 360bbb-3(b)(1), unless the authorization is terminated  or revoked sooner.       Influenza A by PCR NEGATIVE NEGATIVE Final   Influenza B by PCR NEGATIVE  NEGATIVE Final    Comment: (NOTE) The Xpert Xpress SARS-CoV-2/FLU/RSV plus assay is intended as an aid in the diagnosis of influenza from Nasopharyngeal swab specimens and should not be  used as a sole basis for treatment. Nasal washings and aspirates are unacceptable for Xpert Xpress SARS-CoV-2/FLU/RSV testing.  Fact Sheet for Patients: EntrepreneurPulse.com.au  Fact Sheet for Healthcare Providers: IncredibleEmployment.be  This test is not yet approved or cleared by the [place] FDA and has been authorized for detection and/or diagnosis of SARS-CoV-2 by FDA under an Emergency Use Authorization (EUA). This EUA will remain in effect (meaning this test can be used) for the duration of the COVID-19 declaration under Section 564(b)(1) of the Act, 21 U.S.C. section 360bbb-3(b)(1), unless the authorization is terminated or revoked.  Performed at [facility], [address] [place], [place] [address]   Culture, blood (routine x 2)     Status: None (Preliminary result)   Collection Time: 12/07/20 12:34 AM   Specimen: BLOOD  Result Value Ref Range Status   Specimen Description BLOOD RIGHT ARM  Final   Special Requests   Final    BOTTLES DRAWN AEROBIC AND ANAEROBIC Blood Culture adequate volume   Culture   Final    NO GROWTH 3 DAYS Performed at [facility], [address], [place], [place] [address]    Report Status PENDING  Incomplete  Culture, blood (routine x 2)     Status: None (Preliminary result)   Collection Time: 12/07/20 12:34 AM   Specimen: BLOOD  Result Value Ref Range Status   Specimen Description BLOOD RIGHT ARM  Final   Special Requests   Final    BOTTLES DRAWN AEROBIC AND ANAEROBIC Blood Culture adequate volume   Culture   Final    NO GROWTH 3 DAYS Performed at [facility], [address] [place], [place] [address]    Report Status PENDING  Incomplete  Aerobic/Anaerobic Culture w Gram Stain (surgical/deep wound)     Status: None (Preliminary result)   Collection Time: 12/07/20  4:26 PM   Specimen: PATH ENT biopsy; Tissue  Result Value Ref Range Status   Specimen  Description TISSUE  Final   Special Requests MANDIBULAR GINGIVA PT ON VAN ,CEFIPIME  Final   Gram Stain   Final    NO WBC SEEN NO ORGANISMS SEEN Performed at [facility], [address] [place], [place] [address]    Culture   Final    RARE STREPTOCOCCUS INTERMEDIUS CULTURE REINCUBATED FOR BETTER GROWTH NO ANAEROBES ISOLATED; CULTURE IN PROGRESS FOR 5 DAYS    Report Status PENDING  Incomplete  Aerobic/Anaerobic Culture w Gram Stain (surgical/deep wound)     Status: None (Preliminary result)   Collection Time: 12/07/20  4:34 PM   Specimen: PATH Bone biopsy; Tissue  Result Value Ref Range Status   Specimen Description BONE  Final   Special Requests MANDIBULAR PT ON VANC ,CEFIPIME  Final   Gram Stain   Final    NO WBC SEEN NO ORGANISMS SEEN Performed at [facility], [address] [place], [place] [address]    Culture   Final    RARE STREPTOCOCCUS INTERMEDIUS CULTURE REINCUBATED FOR BETTER GROWTH NO ANAEROBES ISOLATED; CULTURE IN PROGRESS FOR 5 DAYS    Report Status PENDING  Incomplete  Aerobic/Anaerobic Culture w Gram Stain (surgical/deep wound)     Status: None (Preliminary result)   Collection Time: 12/07/20  4:36 PM   Specimen: PATH Other;  Tissue  Result Value Ref Range Status   Specimen Description ABSCESS  Final   Special Requests RT MANDIBULAR PT ON VANC,CEFIPIME  Final   Gram Stain   Final    NO WBC SEEN NO ORGANISMS SEEN Performed at [facility], [address], [place], [place] [address]    Culture   Final    RARE STREPTOCOCCUS INTERMEDIUS CULTURE REINCUBATED FOR BETTER GROWTH NO ANAEROBES ISOLATED; CULTURE IN PROGRESS FOR 5 DAYS    Report Status PENDING  Incomplete      Studies: No results found.  Scheduled Meds:  chlorhexidine  15 mL Mouth/Throat QID   Chlorhexidine Gluconate Cloth  6 each Topical Daily   clobetasol ointment   Topical BID   docusate sodium  100 mg Oral BID   enoxaparin (LOVENOX) injection  40 mg Subcutaneous  Q24H   feeding supplement  1 Container Oral BID BM   feeding supplement  237 mL Oral BID BM   folic acid  2 mg Oral Daily   gabapentin  300 mg Oral Daily   lamoTRIgine  150 mg Oral BID   latanoprost  1 drop Both Eyes QHS   mesalamine  1.2 g Oral BID   methscopolamine  5 mg Oral BID   multivitamin with minerals  1 tablet Oral Daily   pantoprazole  40 mg Oral BID AC   rifaximin  550 mg Oral BID   sertraline  200 mg Oral Q breakfast   sodium chloride flush  3 mL Intravenous Q12H   traZODone  150 mg Oral QHS    Continuous Infusions:  ertapenem 1,000 mg (12/09/20 1325)     LOS: 5 days     Cristal Deer, MD [facility]  To reach me or the doctor on call, go to: [email or web address] Password [email or web address]  12/10/2020, 10:56 AM

## 2020-12-11 ENCOUNTER — Ambulatory Visit: Payer: 59 | Admitting: Neurology

## 2020-12-11 DIAGNOSIS — L03211 Cellulitis of face: Secondary | ICD-10-CM | POA: Diagnosis not present

## 2020-12-11 LAB — CBC WITH DIFFERENTIAL/PLATELET
Abs Immature Granulocytes: 0.09 10*3/uL — ABNORMAL HIGH (ref 0.00–0.07)
Basophils Absolute: 0 10*3/uL (ref 0.0–0.1)
Basophils Relative: 0 %
Eosinophils Absolute: 0 10*3/uL (ref 0.0–0.5)
Eosinophils Relative: 0 %
HCT: 26.4 % — ABNORMAL LOW (ref 39.0–52.0)
Hemoglobin: 9.1 g/dL — ABNORMAL LOW (ref 13.0–17.0)
Immature Granulocytes: 2 %
Lymphocytes Relative: 9 %
Lymphs Abs: 0.6 10*3/uL — ABNORMAL LOW (ref 0.7–4.0)
MCH: 37.9 pg — ABNORMAL HIGH (ref 26.0–34.0)
MCHC: 34.5 g/dL (ref 30.0–36.0)
MCV: 110 fL — ABNORMAL HIGH (ref 80.0–100.0)
Monocytes Absolute: 0.7 10*3/uL (ref 0.1–1.0)
Monocytes Relative: 12 %
Neutro Abs: 4.8 10*3/uL (ref 1.7–7.7)
Neutrophils Relative %: 77 %
Platelets: 141 10*3/uL — ABNORMAL LOW (ref 150–400)
RBC: 2.4 MIL/uL — ABNORMAL LOW (ref 4.22–5.81)
RDW: 15.7 % — ABNORMAL HIGH (ref 11.5–15.5)
WBC: 6.2 10*3/uL (ref 4.0–10.5)
nRBC: 0.5 % — ABNORMAL HIGH (ref 0.0–0.2)

## 2020-12-11 LAB — SURGICAL PATHOLOGY

## 2020-12-11 LAB — C-REACTIVE PROTEIN: CRP: 1 mg/dL — ABNORMAL HIGH (ref <1.0)

## 2020-12-11 LAB — SEDIMENTATION RATE: Sed Rate: 17 mm/hr — ABNORMAL HIGH (ref 0–16)

## 2020-12-11 MED ORDER — CLOBETASOL PROPIONATE 0.05 % EX OINT
TOPICAL_OINTMENT | Freq: Two times a day (BID) | CUTANEOUS | 0 refills | Status: DC
Start: 2020-12-11 — End: 2020-12-14

## 2020-12-11 MED ORDER — HYDROCODONE-ACETAMINOPHEN 5-325 MG PO TABS: 1.0000 | ORAL_TABLET | Freq: Four times a day (QID) | ORAL | 0 refills | Status: AC | PRN

## 2020-12-11 MED ORDER — ADULT MULTIVITAMIN W/MINERALS CH: 1.0000 | ORAL_TABLET | Freq: Every day | ORAL | 0 refills | Status: DC

## 2020-12-11 MED ORDER — ONDANSETRON HCL 4 MG PO TABS: 4.0000 mg | ORAL_TABLET | Freq: Four times a day (QID) | ORAL | 0 refills | Status: DC | PRN

## 2020-12-11 MED ORDER — FLUCONAZOLE 100 MG PO TABS
400.0000 mg | ORAL_TABLET | Freq: Every day | ORAL | Status: DC
  Administered 2020-12-11: 400 mg via ORAL
  Filled 2020-12-11: qty 4

## 2020-12-11 MED ORDER — DOCUSATE SODIUM 100 MG PO CAPS: 100.0000 mg | ORAL_CAPSULE | Freq: Two times a day (BID) | ORAL | 0 refills | Status: DC

## 2020-12-11 MED ORDER — GABAPENTIN 300 MG PO CAPS: 300.0000 mg | ORAL_CAPSULE | Freq: Every day | ORAL | 0 refills | Status: DC

## 2020-12-11 MED ORDER — FLUCONAZOLE 200 MG PO TABS: 400.0000 mg | ORAL_TABLET | Freq: Every day | ORAL | 0 refills | Status: DC

## 2020-12-11 MED ORDER — ENSURE ENLIVE PO LIQD: 237.0000 mL | Freq: Two times a day (BID) | ORAL | 12 refills | Status: DC

## 2020-12-11 MED ORDER — HEPARIN SOD (PORK) LOCK FLUSH 100 UNIT/ML IV SOLN
250.0000 [IU] | INTRAVENOUS | Status: AC | PRN
  Administered 2020-12-11: 250 [IU]
  Filled 2020-12-11: qty 2.5

## 2020-12-11 MED ORDER — BISACODYL 5 MG PO TBEC
5.0000 mg | DELAYED_RELEASE_TABLET | Freq: Every day | ORAL | 0 refills | Status: DC | PRN
Start: 2020-12-11 — End: 2020-12-14

## 2020-12-11 MED ORDER — CHLORHEXIDINE GLUCONATE 0.12 % MT SOLN: 15.0000 mL | Freq: Four times a day (QID) | OROMUCOSAL | 0 refills | Status: DC

## 2020-12-11 NOTE — Discharge Summary (Signed)
Discharge Summary  [name] [name] HQP:[id number] DOB: [birth year]  PCP: [name], MD  Admit date: 12/04/2020 Discharge date: 12/11/2020  Time spent: 30 minutes  Recommendations for Outpatient Follow-up:  Maxillofacial surgeon ENT Primary care provider  Discharge Diagnoses:  Active Hospital Problems   Diagnosis Date Noted   Facial cellulitis 12/05/2020   Memory loss 04/30/2016   OSA on CPAP 10/31/2015   Seizure ([place]) 04/04/2013   DM2 (diabetes mellitus, type 2) ([place]) 09/25/2011   CROHN'S DISEASE, LARGE AND SMALL INTESTINES 09/11/2007   Essential hypertension 02/27/2007    Resolved Hospital Problems   Diagnosis Date Noted Date Resolved   Sepsis ([place]) 12/04/2020 12/05/2020    Discharge Condition: Improved  Diet recommendation: Heart healthy  Vitals:   12/11/20 0417 12/11/20 1410  BP: 108/63 129/69  Pulse: 64 73  Resp: 16 18  Temp: 98.7 F (37.1 C) 98.5 F (36.9 C)  SpO2: 96% 100%    History of present illness:     This is a 60 year old male with history of seizure disorder, cognitive impairment [name] [name] cirrhosis Crohn's disease depression anxiety diabetes mellitus hypertension obstructive sleep apnea on CPAP who presented to the emergency room with right facial pain initially presented on June 2 with facial pain and dysphagia and sore throat CT scan was negative for any deep space abscess and was treated with clindamycin and viscous lidocaine and discharged home he came back on November 25, 2020 with dental pain and sore throat to follow-up with outpatient but then he started to have fevers of 103 with continued facial pain and difficulty speaking despite completing his antibiotics he was therefore admitted for further evaluation dental evaluation and consult was obtained.  Hospital Course:  Principal Problem:   Facial cellulitis Active Problems:   Essential hypertension   CROHN'S DISEASE, LARGE AND SMALL INTESTINES   DM2 (diabetes mellitus, type 2) (HCC)   Seizure  (H[facility])   OSA on CPAP   Memory loss   Cellulitis of the left face continue full liquids continue Peridex mouthwash.  Infectious disease has been consulted by dental.  Dental want him to stay inpatient and not to be discharged yet Blood cultures negative x5 days Deep wound culture rare positive Streptococcus intermedius .wbc is normal Continue IV antibiotics at home for 6 weeks.  Patient was discharged with a PICC line  2.  Seizure disorder stable continue Lamictal  3.  Not cirrhosis continue rifaximin and lactulose  4.  Macrocytic anemia.  His hemoglobin today is 8.2 on admission was 10 and yesterday was 9.8  5.  Type 2 diabetes mellitus controlled on diet  6.  Obstructive sleep apnea patient was on CPAP but this is on hold due to facial swelling   7.  Sore throat/dysphagia.  We will start him on Cepacol lozenges, ENT was consulted and he stated to follow-up with them in 1 month if he continues to have dysphagia and sore throat   Consultants: Oral surgeon  Infectious disease ENT     Procedures: Tooth extraction #20-30 and debridement of mandible on December 07, 2020 by oral surgeon diabetes   Antimicrobials: Invanz   DVT prophylaxis: Lovenox Discharge Exam: BP 129/69 (BP Location: Right Arm)   Pulse 73   Temp 98.5 F (36.9 C) (Oral)   Resp 18   Ht 5' 11"  (1.803 m)   Wt 81.6 kg   SpO2 100%   BMI 25.09 kg/m   General: 60 y.o. year-old male well developed well nourished in no acute distress.  Alert  and oriented x3. HEENT.  He has some facial cellulitis particularly on the right there is no obvious redness.  There is a area of induration in the right jaw Cardiovascular: Regular rate and rhythm with no rubs or gallops.  No thyromegaly or JVD noted.   Respiratory: Clear to auscultation with no wheezes or rales. Good inspiratory effort. Abdomen: Soft nontender nondistended with normal bowel sounds x4 quadrants. Musculoskeletal: No lower extremity edema. 2/4 pulses in all 4  extremities. Skin: No ulcerative lesions noted or rashes, Psychiatry: Mood is appropriate for condition and setting     Discharge Instructions You were cared for by a hospitalist during your hospital stay. If you have any questions about your discharge medications or the care you received while you were in the hospital after you are discharged, you can call the unit and asked to speak with the hospitalist on call if the hospitalist that took care of you is not available. Once you are discharged, your primary care physician will handle any further medical issues. Please note that NO REFILLS for any discharge medications will be authorized once you are discharged, as it is imperative that you return to your primary care physician (or establish a relationship with a primary care physician if you do not have one) for your aftercare needs so that they can reassess your need for medications and monitor your lab values.  Discharge Instructions     [facility] pharmacist to adjust dose for Vancomycin, Aminoglycosides and other anti-infective therapies as requested by physician.   Complete by: As directed    [facility] to provide Cath Flo 12m   Complete by: As directed    Administer for PICC line occlusion and as ordered by physician for other access device issues.   Anaphylaxis Kit: Provided to treat any anaphylactic reaction to the medication being provided to the patient if First Dose or when requested by physician   Complete by: As directed    Epinephrine 164mml vial / amp: Administer 0.1m50m0.1ml45mubcutaneously once for moderate to severe anaphylaxis, nurse to call physician and pharmacy when reaction occurs and call 911 if needed for immediate care   Diphenhydramine 50mg42mIV vial: Administer 25-50mg 59mM PRN for first dose reaction, rash, itching, mild reaction, nurse to call physician and pharmacy when reaction occurs   Sodium Chloride 0.9% NS 500ml I80mdminister if  needed for hypovolemic blood pressure drop or as ordered by physician after call to physician with anaphylactic reaction   Change dressing on IV access line weekly and PRN   Complete by: As directed    Diet - low sodium heart healthy   Complete by: As directed    Discharge instructions   Complete by: As directed    Follow-up with primary care provider in 1 week Follow-up with maxillofacial surgeon as scheduled Follow-up with ENT in 1 month if still having dysphagia OMFS Recommendations -Full cup liquid diet; will plan for liquid diet for 2-3 weeks until soft tissues have healed at which time he can advance to soft diet -Peridex (chlorhexidine) mouthrnise QID -PICC In place; agree with Ertapenem x 6 weeks appreciate ID recs       - OMFS  [facility] [place] with Sodium Chloride 0.9% and Heparin 10 units/ml or 100 units/ml   Complete by: As directed    Home infusion instructions - [facility]   Complete by: As  directed    Instructions: Flush IV access with Sodium Chloride 0.9% and Heparin 10units/ml or 100units/ml   Change dressing on IV access line: Weekly and PRN   Instructions Cath Flo 13m: Administer for PICC Line occlusion and as ordered by physician for other access device   [facility] pharmacist to adjust dose for: Vancomycin, Aminoglycosides and other anti-infective therapies as requested by physician   Increase activity slowly   Complete by: As directed    Method of administration may be changed at the discretion of home infusion pharmacist based upon assessment of the patient and/or caregiver's ability to self-administer the medication ordered   Complete by: As directed    No wound care   Complete by: As directed       Allergies as of 12/11/2020       Reactions   Azithromycin Swelling   SWELLING REACTION UNSPECIFIED    Beef-derived Products Diarrhea   RED MEAT > UNSPECIFIED  REACTION    Milk-related Compounds Diarrhea   DAIRY >UNSPECIFIED REACTION    Penicillins Hives   Rash as child- tolerates Ancef/CHILDHOOD ALLERGY Has patient had a PCN reaction causing immediate rash, facial/tongue/throat swelling, SOB or lightheadedness with hypotension: YES Has patient had a PCN reaction causing severe rash involving mucus membranes or skin necrosis: Unknown Has patient had a PCN reaction that required hospitalization: Unknown Has patient had a PCN reaction occurring within the last 10 years:Unknown If all of the above answers are "NO", then may proceed with Cephalosporin [place]  Sulfonamide Derivatives Hives   Dilaudid [hydromorphone Hcl] Nausea And Vomiting        Medication List     STOP taking these medications    ADVIL PM PO   oxyCODONE 5 MG immediate release tablet Commonly known as: Roxicodone       TAKE these medications    bisacodyl 5 MG EC tablet Commonly known as: DULCOLAX Take 1 tablet (5 mg total) by mouth daily as needed for moderate constipation.   CALCIUM 600+D PO Take 1 tablet by mouth 2 (two) times daily.   chlorhexidine 0.12 % solution Commonly known as: PERIDEX Use as directed 15 mLs in the mouth or throat 4 (four) times daily.   cholecalciferol 1000 units tablet Commonly known as: VITAMIN D Take 1,000 Units at bedtime by mouth.   clobetasol ointment 0.05 % Commonly known as: TEMOVATE Apply topically 2 (two) times daily.   DAIRY RELIEF PO Take 2 tablets by mouth in the morning and at bedtime.   docusate sodium 100 MG capsule Commonly known as: COLACE Take 1 capsule (100 mg total) by mouth 2 (two) times daily.   eletriptan 20 MG tablet Commonly known as: RELPAX Take 1 tablet (20 mg total) by mouth as needed for migraine or headache. May repeat in 2 hours if headache persists or recurs.   ertapenem  IVPB Commonly known as: INVANZ Inject 1 g into the vein daily. Indication:  Mandibular osteomyelitis  First Dose:  Yes Last Day of Therapy:  01/18/21 Labs - Once weekly:  CBC/D and BMP, Labs - Every other week:  ESR and CRP Method of administration: Mini-Bag Plus / Gravity Method of administration may be changed at the discretion of home infusion pharmacist based upon assessment of the patient and/or caregiver's ability to self-administer the medication ordered.   esomeprazole 40 MG capsule Commonly known as: NEXIUM TAKE 1 CAPSULE TWICE DAILY What changed: when to take this   Eszopiclone 3 MG Tabs Take 3 mg  by mouth at bedtime. Take immediately before bedtime   feeding supplement Liqd Take 237 mLs by mouth 2 (two) times daily between meals.   fluconazole 200 MG tablet Commonly known as: DIFLUCAN Take 2 tablets (400 mg total) by mouth daily. Start taking on: December 13, 8586   folic acid 1 MG tablet Commonly known as: FOLVITE Take 2 tablets (2 mg total) by mouth daily.   gabapentin 300 MG capsule Commonly known as: NEURONTIN Take 1 capsule (300 mg total) by mouth daily.   ONE TOUCH ULTRA TEST test strip Generic drug: glucose blood USE AS INSTRUCTED   glucose blood test strip Commonly known as: Choice DM Fora G20 Test Strips Use as instructed   HYDROcodone-acetaminophen 5-325 MG tablet Commonly known as: NORCO/VICODIN Take 1 tablet by mouth every 6 (six) hours as needed for up to 7 days for moderate pain.   lamoTRIgine 150 MG tablet Commonly known as: LaMICtal Take 1 tablet (150 mg total) by mouth 2 (two) times daily.   latanoprost 0.005 % ophthalmic solution Commonly known as: XALATAN Place 1 drop into both eyes at bedtime.   lidocaine 2 % solution Commonly known as: XYLOCAINE Use as directed 15 mLs in the mouth or throat as needed for mouth pain.   mercaptopurine 50 MG tablet Commonly known as: PURINETHOL TAKE 2 TABLETS DAILY ON AN EMPTY STOMACH 1 HOUR BEFOREOR 2 HOURS AFTER A MEAL.   CAUTION: CHEMOTHERAPY What changed: See the new instructions.   mesalamine 1.2 g EC  tablet Commonly known as: LIALDA TAKE 2 TABLETS DAILY WITH  BREAKFAST What changed:  how much to take how to take this when to take this additional instructions   methscopolamine 5 MG tablet Commonly known as: PAMINE FORTE TAKE 1 TABLET TWICE A DAY   multivitamin with minerals Tabs tablet Take 1 tablet by mouth daily. Start taking on: December 12, 2020   niacinamide 100 MG tablet Take 100 mg by mouth 2 (two) times daily.   ondansetron 4 MG tablet Commonly known as: ZOFRAN Take 1 tablet (4 mg total) by mouth every 6 (six) hours as needed for nausea.   potassium chloride SA 20 MEQ tablet Commonly known as: KLOR-CON Take 1 tablet (20 mEq total) by mouth 2 (two) times daily.   PROBIOTIC PO Take 1 tablet at bedtime by mouth.   sertraline 100 MG tablet Commonly known as: ZOLOFT Take 200 mg daily with breakfast by mouth.   tamsulosin 0.4 MG Caps capsule Commonly known as: FLOMAX Take 0.4 mg by mouth at bedtime as needed (blood in urine).   traZODone 150 MG tablet Commonly known as: DESYREL Take 150 mg by mouth at bedtime.   Vitamin B Complex Tabs Take 1 tablet by mouth every evening.   vitamin C 500 MG tablet Commonly known as: ASCORBIC ACID Take 500 mg by mouth daily.   Xifaxan 550 MG Tabs tablet Generic drug: rifaximin TAKE 1 TABLET TWICE A DAY What changed: how much to take               Discharge Care Instructions  (From admission, onward)           Start     Ordered   12/08/20 0000  Change dressing on IV access line weekly and PRN  (Home infusion instructions - [facility] )        12/08/20 1353           Allergies  Allergen Reactions   Azithromycin Swelling  SWELLING REACTION UNSPECIFIED    Beef-Derived Products Diarrhea    RED MEAT > UNSPECIFIED REACTION    Milk-Related Compounds Diarrhea    DAIRY >UNSPECIFIED REACTION    Penicillins Hives    Rash as child- tolerates Ancef/CHILDHOOD ALLERGY Has patient had a PCN  reaction causing immediate rash, facial/tongue/throat swelling, SOB or lightheadedness with hypotension: YES Has patient had a PCN reaction causing severe rash involving mucus membranes or skin necrosis: Unknown Has patient had a PCN reaction that required hospitalization: Unknown Has patient had a PCN reaction occurring within the last 10 years:Unknown If all of the above answers are "NO", then may proceed with Cephalosporin [place]   Sulfonamide Derivatives Hives   Dilaudid [Hydromorphone Hcl] Nausea And Vomiting      The results of significant diagnostics from this hospitalization (including imaging, microbiology, ancillary and laboratory) are listed below for reference.    Significant Diagnostic Studies: DG Orthopantogram  Result Date: 12/06/2020 CLINICAL DATA:  Jaw pain, initial encounter EXAM: ORTHOPANTOGRAM/PANORAMIC COMPARISON:  Maxillofacial CT from the previous day. FINDINGS: Panoramic view of the mandible shows no acute fracture. Dental hardware is noted bilaterally. No periapical abscess is seen. No acute abnormality is noted. IMPRESSION: No acute abnormality seen. Electronically Signed   By: Inez Catalina M.D.   On: 12/06/2020 09:17   CT Head Wo Contrast  Result Date: 12/04/2020 CLINICAL DATA:  Acute onset altered mental status. EXAM: CT HEAD WITHOUT CONTRAST TECHNIQUE: Contiguous axial images were obtained from the base of the skull through the vertex without intravenous contrast. COMPARISON:  09/08/2020 FINDINGS: Brain: No evidence of acute infarction, hemorrhage, hydrocephalus, extra-axial collection or mass lesion/mass effect. Mild atrophy most prominent in the frontal regions. Vascular: Mild intracranial arterial vascular calcifications. Skull: Calvarium appears intact. Sinuses/Orbits: Paranasal sinuses and mastoid air cells are clear. Other: No significant changes since previous study. IMPRESSION: No acute intracranial abnormalities.  Mild cerebral atrophy. Electronically Signed    By: Lucienne Capers M.D.   On: 12/04/2020 21:00   CT Soft Tissue Neck Wo Contrast  Result Date: 11/23/2020 CLINICAL DATA:  Concern for neck abscess. Patient has had difficulty swallowing for 3 days with sore throat and small lymph nodes. EXAM: CT NECK WITHOUT CONTRAST TECHNIQUE: Multidetector CT imaging of the neck was performed following the standard protocol without intravenous contrast. COMPARISON:  CT cervical spine September 08, 2020. FINDINGS: Pharynx and larynx: Evaluation is limited by streak artifact at the oropharynx. Mildly prominent tonsils bilaterally, which are symmetric. No clear mass or substantial inflammatory change. While evaluation is limited without contrast and given streak artifact, no visible drainable fluid collection Salivary glands: No inflammation, mass, or stone. Thyroid: Normal. Lymph nodes: None enlarged or abnormal density. Vascular: Nondiagnostic evaluation in the absence of contrast. Limited intracranial: On limited assessment, no obvious acute intracranial abnormality in the visualized brain. Visualized orbits: Negative. Mastoids and visualized paranasal sinuses: Largely clear visualized sinuses with evidence of prior endoscopic sinus surgery. Clear sinuses. Skeleton: Large/bulky bridging anterior osteophytes throughout the cervical spine. Remote T3 compression fracture with similar height loss. Upper chest: Visualized lung apices are clear. IMPRESSION: 1. Mild prominence of the tonsils bilaterally, which is nonspecific but could represent hypertrophy or tonsillitis given the provided clinical history. Evaluation is limited by streak artifact at the oropharynx and the absence of contrast without visible drainable fluid collection. Recommend correlation with direct inspection. 2. Large/bulky bridging anterior osteophytes throughout the cervical spine, which could cause dysphagia/odynophagia. Findings are suggestive of diffuse idiopathic skeletal hyperostosis ([name]). Electronically  Signed  By: Margaretha Sheffield MD   On: 11/23/2020 11:18   CT MAXILLOFACIAL W CONTRAST  Result Date: 12/05/2020 CLINICAL DATA:  The lies of the face. EXAM: CT MAXILLOFACIAL WITH CONTRAST TECHNIQUE: Multidetector CT imaging of the maxillofacial structures was performed with intravenous contrast. Multiplanar CT image reconstructions were also generated. CONTRAST:  171m OMNIPAQUE IOHEXOL 300 MG/ML  SOLN COMPARISON:  None. FINDINGS: Osseous: No fracture or mandibular dislocation. No destructive process. Orbits: Negative. No traumatic or inflammatory finding. Sinuses: Mild scattered ethmoid air cell mucosal thickening. Right frontal sinus osteoma. Remaining sinuses are clear. No mastoid effusions. Soft tissues: Edema and soft tissue thickening overlying the right anterior mandible, compatible with cellulitis/phlegmon. Streak artifact from adjacent dental amalgam limits evaluation without visible drainable fluid collection. Limited intracranial: No obvious acute intracranial abnormality in the visualized brain on limited assessment. IMPRESSION: Edema and soft tissue thickening overlying the right anterior mandible, compatible with cellulitis/phlegmon. Streak artifact from adjacent dental amalgam limits evaluation without visible drainable fluid collection. Electronically Signed   By: FMargaretha SheffieldMD   On: 12/05/2020 14:49   DG Chest Port 1 View  Result Date: 12/04/2020 CLINICAL DATA:  Right-sided facial pain.  Febrile. EXAM: PORTABLE CHEST 1 VIEW COMPARISON:  09/08/2020 FINDINGS: Shallow inspiration with elevation of the right hemidiaphragm. Heart size and pulmonary vascularity are normal for technique. No airspace disease or consolidation in the lungs. No pleural effusions. No pneumothorax. Multiple old appearing right rib fractures. Postoperative changes in the distal right clavicle. Calcification of the aorta. Degenerative changes in the spine and shoulders. IMPRESSION: Shallow inspiration.  No evidence  of active pulmonary disease. Electronically Signed   By: WLucienne CapersM.D.   On: 12/04/2020 20:44   TEMPORAL ARTERY  Result Date: 12/06/2020  TEMPORAL ARTERY REPORT Patient Name:  TJACCOB CZAPLICKI Date of Exam:   12/06/2020 Medical Rec #: [id number]     Accession #:    [id number] of Birth: 615-Aug-1962     Patient Gender: M Patient Age:   21YEx[facility]      VAS [place] BIL[place]AL Referring Phys: [id number] JENNIFER YATES --------------------------------------------------------------------------------  Indications: Jaw pain, throat pain and scalp tenderness. High Risk Factors: Age > 50 yrs.  Performing Technologist: MMaudry MayhewMHA, RDMS, RVT, RDCS  Examination Guidelines: Patient in reclined position. 2D, color and spectral doppler sampling in the temporal artery along the hairline and temple in the longitudinal plane. 2D images along the hairline and temple in the transverse plane. Exam is bilateral.  Summary: Absence of a "halo" sign in the bilateral temporal artery, although not definitive, makes a diagnosis of temporal arteritis unlikely.  *See table(s) above for measurements and observations.  Electronically signed by VHarold BarbanMD on 12/06/2020 at 7:50:31 PM.   Final    [place] SITE RITE  Result Date: 12/08/2020 If Site Rite image not attached, placement could not be confirmed due to current cardiac rhythm.   Microbiology: Recent Results (from the past 240 hour(s))  Culture, blood (Routine x 2)     Status: None   Collection Time: 12/04/20  7:58 PM   Specimen: BLOOD LEFT WRIST  Result Value Ref Range Status   Specimen Description BLOOD LEFT WRIST  Final   Special Requests   Final    BOTTLES DRAWN AEROBIC AND ANAEROBIC Blood Culture adequate volume   Culture   Final    NO GROWTH 5 DAYS [facility]formed at [facility] [address] [place], [place] [address]    Report Status 12/09/2020 FINAL  Final  Culture, blood (Routine x 2)      Status: Abnormal   Collection Time: 12/04/20  8:00 PM   Specimen: BLOOD LEFT FOREARM  Result Value Ref Range Status   Specimen Description BLOOD LEFT FOREARM  Final   Special Requests   Final    BOTTLES DRAWN AEROBIC AND ANAEROBIC Blood Culture adequate volume   Culture  Setup Time   Final    GRAM POSITIVE COCCI IN CHAINS AEROBIC BOTTLE ONLY Organism ID to follow CRITICAL RESULT CALLED TO, READ BACK BY AND VERIFIED WITHAlona Bene PHARMD, AT 0319 12/06/20 [place] Performed at [facility], [place] [address] [place], [place] [address]    Culture STREPTOCOCCUS INTERMEDIUS (A)  Final   Report Status 12/08/2020 FINAL  Final   Organism ID, Bacteria STREPTOCOCCUS INTERMEDIUS  Final      Susceptibility   Streptococcus intermedius - MIC*    PENICILLIN 0.12 SENSITIVE Sensitive     CEFTRIAXONE 0.5 SENSITIVE Sensitive     ERYTHROMYCIN >=8 RESISTANT Resistant     LEVOFLOXACIN 0.5 SENSITIVE Sensitive     VANCOMYCIN 0.5 SENSITIVE Sensitive     * STREPTOCOCCUS INTERMEDIUS  Blood Culture ID Panel (Reflexed)     Status: Abnormal   Collection Time: 12/04/20  8:00 PM  Result Value Ref Range Status   Enterococcus faecalis NOT DETECTED NOT DETECTED Final   Enterococcus Faecium NOT DETECTED NOT DETECTED Final   Listeria monocytogenes NOT DETECTED NOT DETECTED Final   Staphylococcus species NOT DETECTED NOT DETECTED Final   Staphylococcus aureus (BCID) NOT DETECTED NOT DETECTED Final   Staphylococcus epidermidis NOT DETECTED NOT DETECTED Final   Staphylococcus lugdunensis NOT DETECTED NOT DETECTED Final   Streptococcus species DETECTED (A) NOT DETECTED Final    Comment: Not Enterococcus species, Streptococcus agalactiae, Streptococcus pyogenes, or Streptococcus pneumoniae. CRITICAL RESULT CALLED TO, READ BACK BY AND VERIFIED WITH: VPamalee Leyden PHARMD, AT 0319 12/06/20 D. VANHOOK    Streptococcus agalactiae NOT DETECTED NOT DETECTED Final   Streptococcus pneumoniae NOT DETECTED NOT DETECTED Final    Streptococcus pyogenes NOT DETECTED NOT DETECTED Final   A.calcoaceticus-baumannii NOT DETECTED NOT DETECTED Final   Bacteroides fragilis NOT DETECTED NOT DETECTED Final   Enterobacterales NOT DETECTED NOT DETECTED Final   Enterobacter cloacae complex NOT DETECTED NOT DETECTED Final   Escherichia coli NOT DETECTED NOT DETECTED Final   Klebsiella aerogenes NOT DETECTED NOT DETECTED Final   Klebsiella oxytoca NOT DETECTED NOT DETECTED Final   Klebsiella pneumoniae NOT DETECTED NOT DETECTED Final   Proteus species NOT DETECTED NOT DETECTED Final   Salmonella species NOT DETECTED NOT DETECTED Final   Serratia marcescens NOT DETECTED NOT DETECTED Final   Haemophilus influenzae NOT DETECTED NOT DETECTED Final   Neisseria meningitidis NOT DETECTED NOT DETECTED Final   Pseudomonas aeruginosa NOT DETECTED NOT DETECTED Final   Stenotrophomonas maltophilia NOT DETECTED NOT DETECTED Final   Candida albicans NOT DETECTED NOT DETECTED Final   Candida auris NOT DETECTED NOT DETECTED Final   Candida glabrata NOT DETECTED NOT DETECTED Final   Candida krusei NOT DETECTED NOT DETECTED Final   Candida parapsilosis NOT DETECTED NOT DETECTED Final   Candida tropicalis NOT DETECTED NOT DETECTED Final   Cryptococcus neoformans/gattii NOT DETECTED NOT DETECTED Final    Comment: Performed at [facility], [address] [place], [place] [address]  Resp Panel by RT-PCR (Flu A&B, Covid) Nasopharyngeal Swab  Status: None   Collection Time: 12/05/20  3:47 AM   Specimen: Nasopharyngeal Swab; Nasopharyngeal(NP) swabs in vial transport medium  Result Value Ref Range Status   SARS Coronavirus 2 by RT PCR NEGATIVE NEGATIVE Final    Comment: (NOTE) SARS-CoV-2 target nucleic acids are NOT DETECTED.  The SARS-CoV-2 RNA is generally detectable in upper respiratory specimens during the acute phase of infection. The lowest concentration of SARS-CoV-2 viral copies this assay can detect is 138 copies/mL. A  negative result does not preclude SARS-Cov-2 infection and should not be used as the sole basis for treatment or other patient management decisions. A negative result may occur with  improper specimen collection/handling, submission of specimen other than nasopharyngeal swab, presence of viral mutation(s) within the areas targeted by this assay, and inadequate number of viral copies(<138 copies/mL). A negative result must be combined with clinical observations, patient history, and epidemiological information. The expected result is Negative.  Fact Sheet for Patients:  EntrepreneurPulse.com.au  Fact Sheet for Healthcare Providers:  IncredibleEmployment.be  This test is no t yet approved or cleared by the [place] FDA and  has been authorized for detection and/or diagnosis of SARS-CoV-2 by FDA under an Emergency Use Authorization (EUA). This EUA will remain  in effect (meaning this test can be used) for the duration of the COVID-19 declaration under Section 564(b)(1) of the Act, 21 U.S.C.section 360bbb-3(b)(1), unless the authorization is terminated  or revoked sooner.       Influenza A by PCR NEGATIVE NEGATIVE Final   Influenza B by PCR NEGATIVE NEGATIVE Final    Comment: (NOTE) The Xpert Xpress SARS-CoV-2/FLU/RSV plus assay is intended as an aid in the diagnosis of influenza from Nasopharyngeal swab specimens and should not be used as a sole basis for treatment. Nasal washings and aspirates are unacceptable for Xpert Xpress SARS-CoV-2/FLU/RSV testing.  Fact Sheet for Patients: EntrepreneurPulse.com.au  Fact Sheet for Healthcare Providers: IncredibleEmployment.be  This test is not yet approved or cleared by the [place] FDA and has been authorized for detection and/or diagnosis of SARS-CoV-2 by FDA under an Emergency Use Authorization (EUA). This EUA will remain in effect (meaning this test can  be used) for the duration of the COVID-19 declaration under Section 564(b)(1) of the Act, 21 U.S.C. section 360bbb-3(b)(1), unless the authorization is terminated or revoked.  Performed at [facility], [address], [place], [place] [address]   Culture, blood (routine x 2)     Status: None (Preliminary result)   Collection Time: 12/07/20 12:34 AM   Specimen: BLOOD  Result Value Ref Range Status   Specimen Description BLOOD RIGHT ARM  Final   Special Requests   Final    BOTTLES DRAWN AEROBIC AND ANAEROBIC Blood Culture adequate volume   Culture   Final    NO GROWTH 4 DAYS Performed at [facility], [place] [address], [place], [place] [address]    Report Status PENDING  Incomplete  Culture, blood (routine x 2)     Status: None (Preliminary result)   Collection Time: 12/07/20 12:34 AM   Specimen: BLOOD  Result Value Ref Range Status   Specimen Description BLOOD RIGHT ARM  Final   Special Requests   Final    BOTTLES DRAWN AEROBIC AND ANAEROBIC Blood Culture adequate volume   Culture   Final    NO GROWTH 4 DAYS Performed at [facility], [place] [address], [place], [place] [address]    Report Status PENDING  Incomplete  Aerobic/Anaerobic  Culture w Gram Stain (surgical/deep wound)     Status: None (Preliminary result)   Collection Time: 12/07/20  4:26 PM   Specimen: PATH ENT biopsy; Tissue  Result Value Ref Range Status   Specimen Description TISSUE  Final   Special Requests MANDIBULAR GINGIVA PT ON VAN ,CEFIPIME  Final   Gram Stain   Final    NO WBC SEEN NO ORGANISMS SEEN Performed at [facility], [address], [place], [place] [address]    Culture   Final    RARE STREPTOCOCCUS INTERMEDIUS SUSCEPTIBILITIES PERFORMED ON PREVIOUS CULTURE WITHIN THE LAST 5 DAYS. NO ANAEROBES ISOLATED; CULTURE IN PROGRESS FOR 5 DAYS    Report Status PENDING  Incomplete  Aerobic/Anaerobic Culture w Gram Stain (surgical/deep wound)     Status: None  (Preliminary result)   Collection Time: 12/07/20  4:34 PM   Specimen: PATH Bone biopsy; Tissue  Result Value Ref Range Status   Specimen Description BONE  Final   Special Requests MANDIBULAR PT ON VANC ,CEFIPIME  Final   Gram Stain   Final    NO WBC SEEN NO ORGANISMS SEEN Performed at [facility], [address], [place], [place] [address]    Culture   Final    RARE STREPTOCOCCUS INTERMEDIUS SUSCEPTIBILITIES PERFORMED ON PREVIOUS CULTURE WITHIN THE LAST 5 DAYS. RARE CANDIDA ALBICANS NO ANAEROBES ISOLATED; CULTURE IN PROGRESS FOR 5 DAYS    Report Status PENDING  Incomplete  Aerobic/Anaerobic Culture w Gram Stain (surgical/deep wound)     Status: None (Preliminary result)   Collection Time: 12/07/20  4:36 PM   Specimen: PATH Other; Tissue  Result Value Ref Range Status   Specimen Description ABSCESS  Final   Special Requests RT MANDIBULAR PT ON VANC,CEFIPIME  Final   Gram Stain   Final    NO WBC SEEN NO ORGANISMS SEEN Performed at [facility], [address] [place], [place] [address]    Culture   Final    RARE STREPTOCOCCUS INTERMEDIUS RARE LACTOBACILLUS SPECIES Standardized susceptibility testing for this organism is not available. NO ANAEROBES ISOLATED; CULTURE IN PROGRESS FOR 5 DAYS    Report Status PENDING  Incomplete   Organism ID, Bacteria STREPTOCOCCUS INTERMEDIUS  Final      Susceptibility   Streptococcus intermedius - MIC*    PENICILLIN 0.12 SENSITIVE Sensitive     CEFTRIAXONE 0.5 SENSITIVE Sensitive     ERYTHROMYCIN >=8 RESISTANT Resistant     LEVOFLOXACIN 0.5 SENSITIVE Sensitive     VANCOMYCIN 0.5 SENSITIVE Sensitive     * RARE STREPTOCOCCUS INTERMEDIUS     Labs: Basic Metabolic Panel: Recent Labs  Lab 12/04/20 1958 12/04/20 2211 12/06/20 0018 12/07/20 0034 12/07/20 1534 12/07/20 1759 12/08/20 0232 12/09/20 0337  NA 138  --  138 139 140 139  --  141  K 3.8  --  3.6 3.1* 3.8 4.1  --  3.4*  CL 105  --  107 106 107 106  --  107  CO2  23  --  22 25  --   --   --  27  GLUCOSE 134*  --  77 71 108* 126*  --  85  BUN 11  --  11 10 10 10   --  12  CREATININE 0.73  --  0.72 0.71 0.50* 0.50*  --  0.78  CALCIUM 8.7*  --  8.2* 8.3*  --   --   --  8.4*  MG  --  1.5* 2.0  --   --   --  1.9 1.8  PHOS  --  1.9*  --   --   --   --  2.7  --    Liver Function Tests: Recent Labs  Lab 12/04/20 1958  AST 27  ALT 19  ALKPHOS 121  BILITOT 3.1*  PROT 6.8  ALBUMIN 4.1   No results for input(s): LIPASE, AMYLASE in the last 168 hours. Recent Labs  Lab 12/04/20 2211  AMMONIA 62*   CBC: Recent Labs  Lab 12/07/20 0034 12/07/20 1534 12/07/20 1759 12/08/20 0232 12/09/20 0337 12/10/20 0342 12/11/20 0300  WBC 4.2  --   --  5.4 3.8* 3.2* 6.2  NEUTROABS 3.1  --   --  4.5 2.5 2.5 4.8  HGB 9.6*   < > 8.8* 9.5* 8.2* 8.9* 9.1*  HCT 26.1*   < > 26.0* 26.4* 23.5* 25.3* 26.4*  MCV 105.2*  --   --  105.6* 109.3* 107.7* 110.0*  PLT 99*  --   --  131* 121* 117* 141*   < > = values in this interval not displayed.   Cardiac Enzymes: No results for input(s): CKTOTAL, CKMB, CKMBINDEX, TROPONINI in the last 168 hours. BNP: BNP (last 3 results) No results for input(s): BNP in the last 8760 hours.  ProBNP (last 3 results) No results for input(s): PROBNP in the last 8760 hours.  CBG: Recent Labs  Lab 12/07/20 1852  GLUCAP 119*       Signed:  Cristal Deer, MD [facility] 12/11/2020, 2:49 PM

## 2020-12-11 NOTE — Progress Notes (Signed)
Progress Note  ID: The patient is a 82 yoM who presented with pain and swelling of the lower right jaw.  History of Present Illness:  The patient reports having dental pain that began over a week ago. He was started on Clindamycin and saw a general dentist and was subsequently referred to an endodontist for evaluation who was concerned with pathosis area #20/21. However no obvious carious or fractured teeth could be identified and symptomatic teeth #28 and 29 were believed to be related to trigeminal neuralgia. Cone beam CT scan was taken at an imaging center on 11/27/20 and imaging was sent to me from his wife with results indicating concern for osteomyelitis originating from the LEFT mandible. Since that time the swelling/infection has increased and has spread to his right mandible and is continuing to worsen. He is having trouble eating due to it and reports dysphagia. He has a complex medical history with immunocompromised status and PCN allergy - was started on Cefepime/Vanc on admission. He was taken to the OR on 12/07/20 and diagnosis of osteomyelitis was confirmed -- he is s/p removal of teeth #20-30 and debridement of mandibular osteomyelitis. ID has been consulted for PICC line and outpatient IV antibiotics.   Interval History 6/16 - Patient reports feeling overall about same today as yesterday. He thinks his dysphagia however is worsening and the swelling in his mouth is extending/progressing more towards the midline.  6/17 - Patient reports feeling much better this morning after surgery last night and pain is much improved. Denies bleeding issues last night. Reports continued right V3 paresthesia that is about the same as baseline. 6/18 - Patient reports feeling a little worse today overall. Jaw pain is about the same. VAS 6-7. AF. Anemic and thrombocytopenic. 6/19-6/20 - Patient reports some right jaw/chin pain however this is feeling better today. Having increased dysphagia. Cultures growing  Strep intermedius, lactobacillus, candida currently. He is interested in discharge today.    Clinical Exam: Extraoral Exam:              Patient is alert, orientated and in mild distress             CN II-XII intact except right V3 paresthesia             There is mild post-operative edema, pre-op cellulitis/erythema is improved.  The right neck is tender to palpation    Intraoral Exam:              The patient does not have trismus with maximum incisal opening of 50 mm.             The floor of mouth is soft and non-elevated             There is not lateral pharyngeal swelling with the uvula midline             There is no palatal draping present.             Oral airway is patent   Incisions are closed, sutures are intact and surgical sites are hemostatic; no purulence can be expressed from the surgical site; mild vestibular postoperative edema; slightly increased edema in anterior mandibular vestibule, yellowish/fibrin present along incision lines no purulence can be expressed still  Teeth #18, 19, and 31 are non-mobile     Radiographic Exam:               Panorex shows no overt evidence of osteomyelitis, caries, or periapical pathosis involving the teeth.  A CT of the larynx with contrast was obtained showing no fluid collections but mild/diffuse cellulitis of the right jaw/neck. On CT there is area of left lingual mandible concerning for possible osteomyelitic process and this is better visualized on outside CBCT. Outside CBCT from 11/27/20 shows cortical perforations of the left mandible and bony sequestrum of left mandibular lingual area both suggestive of osteomyelitis process as well as loss of cortication and anterior lingual mandible.     Assessment: Mandibular osteomyelitis with resulting facial cellulitis; infection was of quick onset and very aggressive given clinical picture and surgical findings. Pt is now s/p extraction of teeth #20-30, I&D and debridement of  mandible. Clinically his progress has improved overall. Still unclear original source of his infection.  Plan:   OMFS Recommendations -Full cup liquid diet; will plan for liquid diet for 2-3 weeks until soft tissues have healed at which time he can advance to soft diet -Peridex (chlorhexidine) mouthrnise QID -PICC In place; agree with Ertapenem x 6 weeks appreciate ID recs -Will follow patient while he is inpatient; follow up on results from cultures and pathology specimens  -- awaiting pathology results -Daily CBCs, ESR, CRP -Recommend ENT consult for dysphagia and any other recommendations prior to d/c  -OKAY to D/C from OMFS perspective; patient will plan to F/U on Wednesday 6/22 in my office [facility] in [place])   Terese Door, [facility] ([place]) Office # [phone] Cell # [phone]

## 2020-12-11 NOTE — Consult Note (Signed)
Reason for Consult: Dysphagia and sore throat Referring Physician: Hospitalist  [name] [name] is an 60 y.o. male.  HPI: Patient apparently developed a sore throat a couple weeks ago.  He subsequently follow-up with dentist had infection of his mandible and was admitted with osteomyelitis at which time he underwent several dental extractions with Dr.Sherwood.  He still complains of a sore throat and Dr. Conley Simmonds recommended ENT evaluation for follow-up of his dysphagia and sore throat.  He is doing some better today as far as his swallowing and is able to take liquids without too much difficulty.  He does complain of chronic postnasal drainage and chronic clearing of his throat.  Past Medical History:  Diagnosis Date   Adenomatous colon polyp 10/1999   Anxiety    Arthritis    neck, yoga helps.   Cataract    Complex partial seizure ([place])    last seizure was 08-30-2018   Crohn's disease of small and large intestines ([place]) 1999   Depression    Diabetes mellitus    no meds at this time 10-24-17   Esophageal stricture    External hemorrhoids    GERD (gastroesophageal reflux disease)    Glaucoma    History of kidney stones    3 large stones still present   Hyperlipemia    Hypertension    resolved with weight loss    Inguinal hernia, bilateral 12/2019   Liver cirrhosis secondary to NASH ([employer])    Migraines    Osteoporosis    PONV (postoperative nausea and vomiting)    PTSD (post-traumatic stress disorder)    Renal cyst, acquired, left 07/08/2017   Skin cancer    squamous cell multiple; [place]; followed every 3 months.   Sleep apnea    CPAP machine, uses nightly   Staphylococcus aureus bacteremia with sepsis ([place]) 05/28/2017   MRSA 2012    Past Surgical History:  Procedure Laterality Date   CATARACT EXTRACTION Bilateral    COLONOSCOPY     DEBRIDEMENT MANDIBLE N/A 12/07/2020   Procedure: DEBRIDEMENT OF MANDIBLE;  Surgeon: [name], DMD;  Location: [facility];  Service: Oral  Surgery;  Laterality: N/A;   ELBOW SURGERY  2012   elbow MRSA infection    EYE SURGERY     cataracts removed, /w IOL   INCISION AND DRAINAGE ABSCESS N/A 12/07/2020   Procedure: INCISION AND DRAINAGE MANDIBULAR ABSCESS;  Surgeon: [name], DMD;  Location: [facility];  Service: Oral Surgery;  Laterality: N/A;   INGUINAL HERNIA REPAIR Right 01/14/2020   Procedure: OPEN RIGHT HERNIA REPAIR INGUINAL WITH MESH;  Surgeon: [name], MD;  Location: [facility];  Service: General;  Laterality: Right;   INSERTION OF MESH N/A 07/01/2016   Procedure: INSERTION OF MESH;  Surgeon: [name], MD;  Location: [facility];  Service: General;  Laterality: N/A;   IR URETERAL STENT LEFT NEW ACCESS W/O SEP NEPHROSTOMY CATH  02/02/2018   NEPHROLITHOTOMY Left 02/02/2018   Procedure: NEPHROLITHOTOMY PERCUTANEOUS;  Surgeon: [name], MD;  Location: [facility];  Service: Urology;  Laterality: Left;   POLYPECTOMY     SCALP LACERATION REPAIR Right 10/16/2017   From fall/staples   SHOULDER ARTHROSCOPY Right 03/05/2018   Procedure: ARTHROSCOPY SHOULDER AND OPEN DISTAL CLAVICLE EXCISION;  Surgeon: [name], MD;  Location: [facility];  Service: Orthopedics;  Laterality: Right;   SHOULDER SURGERY     SINUS SURGERY WITH INSTATRAK     TEE WITHOUT CARDIOVERSION N/A 05/09/2017   Procedure: TRANSESOPHAGEAL  ECHOCARDIOGRAM (TEE);  Surgeon: [name], MD;  Location: [facility];  Service: Cardiovascular;  Laterality: N/A;   TOOTH EXTRACTION N/A 12/07/2020   Procedure: DENTAL EXTRACTIONS OF TEETH TWENTY TO THIRTY;  Surgeon: [name], DMD;  Location: [facility];  Service: Oral Surgery;  Laterality: N/A;   TRANSTHORACIC ECHOCARDIOGRAM  02/22/2011   EF 55-65%; increased pattern of LVH with mild conc hypertrophy, abnormal relaxation & increased filling pressure (grade 2 diastolic dysfunction); atrial septum thickened (lipomatous hypertrophy)   UMBILICAL HERNIA REPAIR N/A 07/01/2016   Procedure: UMBILICAL HERNIA REPAIR WITH  MESH;  Surgeon: [name], MD;  Location: [facility] [place];  Service: General;  Laterality: N/A;   UPPER GASTROINTESTINAL ENDOSCOPY     VASECTOMY      Social History:  reports that he has never smoked. He has never used smokeless tobacco. He reports that he does not drink alcohol and does not use drugs.  Allergies:  Allergies  Allergen Reactions   Azithromycin Swelling    SWELLING REACTION UNSPECIFIED    Beef-Derived Products Diarrhea    RED MEAT > UNSPECIFIED REACTION    Milk-Related Compounds Diarrhea    DAIRY >UNSPECIFIED REACTION    Penicillins Hives    Rash as child- tolerates Ancef/CHILDHOOD ALLERGY Has patient had a PCN reaction causing immediate rash, facial/tongue/throat swelling, SOB or lightheadedness with hypotension: YES Has patient had a PCN reaction causing severe rash involving mucus membranes or skin necrosis: Unknown Has patient had a PCN reaction that required hospitalization: Unknown Has patient had a PCN reaction occurring within the last 10 years:Unknown If all of the above answers are "NO", then may proceed with Cephalosporin [place]   Sulfonamide Derivatives Hives   Dilaudid [Hydromorphone Hcl] Nausea And Vomiting    Medications: I have reviewed the patient's current medications.  Results for orders placed or performed during the hospital encounter of 12/04/20 (from the past 48 hour(s))  CBC with Differential/Platelet     Status: Abnormal   Collection Time: 12/10/20  3:42 AM  Result Value Ref Range   WBC 3.2 (L) 4.0 - 10.5 K/uL   RBC 2.35 (L) 4.22 - 5.81 MIL/uL   Hemoglobin 8.9 (L) 13.0 - 17.0 g/dL   HCT 25.3 (L) 39.0 - 52.0 %   MCV 107.7 (H) 80.0 - 100.0 fL   MCH 37.9 (H) 26.0 - 34.0 pg   MCHC 35.2 30.0 - 36.0 g/dL   RDW 15.5 11.5 - 15.5 %   Platelets 117 (L) 150 - 400 K/uL    Comment: SPECIMEN CHECKED FOR CLOTS Immature Platelet Fraction may be clinically indicated, consider ordering this additional test [id number] REPEATED TO VERIFY PLATELET COUNT  CONFIRMED BY SMEAR    nRBC 0.9 (H) 0.0 - 0.2 %   Neutrophils Relative % 77 %   Neutro Abs 2.5 1.7 - 7.7 K/uL   Lymphocytes Relative 10 %   Lymphs Abs 0.3 (L) 0.7 - 4.0 K/uL   Monocytes Relative 9 %   Monocytes Absolute 0.3 0.1 - 1.0 K/uL   Eosinophils Relative 1 %   Eosinophils Absolute 0.0 0.0 - 0.5 K/uL   Basophils Relative 1 %   Basophils Absolute 0.0 0.0 - 0.1 K/uL   Immature Granulocytes 2 %   Abs Immature Granulocytes 0.06 0.00 - 0.07 K/uL   Polychromasia PRESENT     Comment: Performed at [facility], [address], [place], [place] [address]  CBC with Differential/Platelet     Status: Abnormal   Collection Time: 12/11/20  3:00  AM  Result Value Ref Range   WBC 6.2 4.0 - 10.5 K/uL   RBC 2.40 (L) 4.22 - 5.81 MIL/uL   Hemoglobin 9.1 (L) 13.0 - 17.0 g/dL   HCT 26.4 (L) 39.0 - 52.0 %   MCV 110.0 (H) 80.0 - 100.0 fL   MCH 37.9 (H) 26.0 - 34.0 pg   MCHC 34.5 30.0 - 36.0 g/dL   RDW 15.7 (H) 11.5 - 15.5 %   Platelets 141 (L) 150 - 400 K/uL   nRBC 0.5 (H) 0.0 - 0.2 %   Neutrophils Relative % 77 %   Neutro Abs 4.8 1.7 - 7.7 K/uL   Lymphocytes Relative 9 %   Lymphs Abs 0.6 (L) 0.7 - 4.0 K/uL   Monocytes Relative 12 %   Monocytes Absolute 0.7 0.1 - 1.0 K/uL   Eosinophils Relative 0 %   Eosinophils Absolute 0.0 0.0 - 0.5 K/uL   Basophils Relative 0 %   Basophils Absolute 0.0 0.0 - 0.1 K/uL   Immature Granulocytes 2 %   Abs Immature Granulocytes 0.09 (H) 0.00 - 0.07 K/uL    Comment: Performed at [facility], [address] [place], [place] [address]    No results found.  ROS: Otherwise negative   PE: Patient is awake and alert and able to swallow without difficulty at bedside today.  He does complain of slight discomfort on the right side of his throat lower down. He has an obvious skin cancer of the right cheek area that apparently he has a scheduled appointment with dermatology. Nasal exam reveals dry crusty right nasal passageway with a narrowed left  nasal passageway and minimal mucus discharge. Oral exam clear oropharynx clear tonsil regions. Fiberoptic laryngoscopy was performed to the right nostril.  He has some crusting within the right nasal passageway but this was clear no polyps noted.  The nasopharynx was clear.  The base of tongue vallecula epiglottis were normal.  The vocal cords were clear with normal vocal cord mobility.  The piriform sinuses were clear.  No hypopharyngeal or laryngeal lesions noted. Neck exam: No palpable adenopathy or masses.  No swelling or erythema noted in the neck.   Assessment/Plan: Sore throat dysphagia I suspect is more related to his osteomyelitis of the mandible that is improving. Concerning his nasal discharge and postnasal drainage recommended regular use of saline nasal irrigations. Also discussed with the patient as well as his wife concerning follow-up in my office in 1 month if he is having any persistent sore throat or dysphagia.  Melony Overly 12/11/2020, 11:43 AM

## 2020-12-11 NOTE — TOC Progression Note (Signed)
Transition of Care [facility]) - Progression Note    Patient Details  Name: [name] [name] MRN: [id number] Date of Birth: 01-26-1961  [facility] of [facility]) CM/SW Contact  Jacalyn Lefevre Edson Snowball, RN Phone Number: 12/11/2020, 9:51 AM  Clinical Narrative:     MD planning n discharging patient today. NCM spoke with Pam with [facility] ( [facility]), teaching has been done with patient and wife. Patient can discharge today after todays dose is given at hospital , scheduled for 10 am. PAtient has arranged home health through [facility] to patient and wife at bedside, notified them of above. Both voiced understanding.   Expected Discharge Plan: [place] Barriers to Discharge: Continued Medical Work up  Expected Discharge Plan and Services Expected Discharge Plan: [place]   Discharge Planning Services: CM Consult Post Acute Care Choice: [place] arrangements for the past 2 months: Single Family Home                           HH Arranged: RN HH Agency:  Personnel officer RN through [employer] for IV ABX) Date HH Agency Contacted: 12/08/20 Time [place]: 1131 Representative spoke with at [place]: VIa PAm at [employer] [facility] [employer] (SDOH) Interventions    Readmission Risk Interventions No flowsheet data found.

## 2020-12-12 ENCOUNTER — Telehealth: Payer: Self-pay

## 2020-12-12 ENCOUNTER — Telehealth: Payer: Self-pay | Admitting: Hematology and Oncology

## 2020-12-12 ENCOUNTER — Other Ambulatory Visit: Payer: Self-pay | Admitting: Internal Medicine

## 2020-12-12 LAB — AEROBIC/ANAEROBIC CULTURE W GRAM STAIN (SURGICAL/DEEP WOUND)
Gram Stain: NONE SEEN
Gram Stain: NONE SEEN
Gram Stain: NONE SEEN

## 2020-12-12 LAB — CULTURE, BLOOD (ROUTINE X 2)
Culture: NO GROWTH
Culture: NO GROWTH
Special Requests: ADEQUATE
Special Requests: ADEQUATE

## 2020-12-12 MED ORDER — FLUCONAZOLE 200 MG PO TABS: 400.0000 mg | ORAL_TABLET | Freq: Every day | ORAL | 1 refills | Status: DC

## 2020-12-12 NOTE — Progress Notes (Signed)
Adding fluconazole orally due to culture growth.

## 2020-12-12 NOTE — Telephone Encounter (Signed)
-----   Message from Thayer Headings, MD sent at 12/12/2020  9:33 AM EDT ----- Can you let him know, based on the cultures, yeast is now growing and I would like him to take oral fluconazole in addition to his IV ertapenem.   thanks

## 2020-12-12 NOTE — Telephone Encounter (Signed)
Relayed to patient and his wife that Dr. Linus Salmons has prescribed oral fluconazole due to his cultures growing yeast and that patient should take this in addition to his IV ertapenem. Patient's wife states ED doctor sent in the same medication when he was discharged yesterday. Per chart, only one prescription for fluconazole is on file. Both patient and his wife verbalized understanding.   Beryle Flock, RN

## 2020-12-12 NOTE — Telephone Encounter (Signed)
Received a new hem referral from Dr. Orland Mustard for pancytopenia. [name] [name] has been cld and scheduled to see Dr. Lorenso Courier on 6/23 at 1pm. Appt date and time has been given to the pt's wife. Aware to arrive 20 minutes early.

## 2020-12-14 ENCOUNTER — Inpatient Hospital Stay: Payer: 59

## 2020-12-14 ENCOUNTER — Other Ambulatory Visit: Payer: Self-pay

## 2020-12-14 ENCOUNTER — Inpatient Hospital Stay: Payer: 59 | Attending: Hematology and Oncology | Admitting: Hematology and Oncology

## 2020-12-14 VITALS — BP 139/75 | HR 80 | Temp 98.4°F | Resp 18 | Ht 71.0 in | Wt 175.9 lb

## 2020-12-14 DIAGNOSIS — D539 Nutritional anemia, unspecified: Secondary | ICD-10-CM

## 2020-12-14 DIAGNOSIS — Z8249 Family history of ischemic heart disease and other diseases of the circulatory system: Secondary | ICD-10-CM | POA: Diagnosis not present

## 2020-12-14 DIAGNOSIS — Z8349 Family history of other endocrine, nutritional and metabolic diseases: Secondary | ICD-10-CM | POA: Insufficient documentation

## 2020-12-14 DIAGNOSIS — E785 Hyperlipidemia, unspecified: Secondary | ICD-10-CM | POA: Insufficient documentation

## 2020-12-14 DIAGNOSIS — G4730 Sleep apnea, unspecified: Secondary | ICD-10-CM | POA: Diagnosis not present

## 2020-12-14 DIAGNOSIS — R6884 Jaw pain: Secondary | ICD-10-CM | POA: Insufficient documentation

## 2020-12-14 DIAGNOSIS — R634 Abnormal weight loss: Secondary | ICD-10-CM | POA: Insufficient documentation

## 2020-12-14 DIAGNOSIS — Z8371 Family history of colonic polyps: Secondary | ICD-10-CM | POA: Insufficient documentation

## 2020-12-14 DIAGNOSIS — Z85828 Personal history of other malignant neoplasm of skin: Secondary | ICD-10-CM | POA: Diagnosis not present

## 2020-12-14 DIAGNOSIS — E1136 Type 2 diabetes mellitus with diabetic cataract: Secondary | ICD-10-CM | POA: Diagnosis not present

## 2020-12-14 DIAGNOSIS — Z8719 Personal history of other diseases of the digestive system: Secondary | ICD-10-CM | POA: Diagnosis not present

## 2020-12-14 DIAGNOSIS — K7460 Unspecified cirrhosis of liver: Secondary | ICD-10-CM | POA: Diagnosis not present

## 2020-12-14 DIAGNOSIS — Z880 Allergy status to penicillin: Secondary | ICD-10-CM | POA: Insufficient documentation

## 2020-12-14 DIAGNOSIS — R519 Headache, unspecified: Secondary | ICD-10-CM | POA: Insufficient documentation

## 2020-12-14 DIAGNOSIS — Z881 Allergy status to other antibiotic agents status: Secondary | ICD-10-CM | POA: Diagnosis not present

## 2020-12-14 DIAGNOSIS — Z823 Family history of stroke: Secondary | ICD-10-CM | POA: Insufficient documentation

## 2020-12-14 DIAGNOSIS — D696 Thrombocytopenia, unspecified: Secondary | ICD-10-CM | POA: Diagnosis not present

## 2020-12-14 DIAGNOSIS — D72819 Decreased white blood cell count, unspecified: Secondary | ICD-10-CM

## 2020-12-14 DIAGNOSIS — I10 Essential (primary) hypertension: Secondary | ICD-10-CM | POA: Diagnosis not present

## 2020-12-14 DIAGNOSIS — Z87442 Personal history of urinary calculi: Secondary | ICD-10-CM | POA: Diagnosis not present

## 2020-12-14 DIAGNOSIS — K7581 Nonalcoholic steatohepatitis (NASH): Secondary | ICD-10-CM | POA: Diagnosis not present

## 2020-12-14 DIAGNOSIS — Z885 Allergy status to narcotic agent status: Secondary | ICD-10-CM | POA: Insufficient documentation

## 2020-12-14 DIAGNOSIS — K5080 Crohn's disease of both small and large intestine without complications: Secondary | ICD-10-CM | POA: Insufficient documentation

## 2020-12-14 DIAGNOSIS — R070 Pain in throat: Secondary | ICD-10-CM | POA: Diagnosis not present

## 2020-12-14 DIAGNOSIS — D61818 Other pancytopenia: Secondary | ICD-10-CM | POA: Insufficient documentation

## 2020-12-14 DIAGNOSIS — R319 Hematuria, unspecified: Secondary | ICD-10-CM | POA: Diagnosis not present

## 2020-12-14 DIAGNOSIS — Z882 Allergy status to sulfonamides status: Secondary | ICD-10-CM | POA: Insufficient documentation

## 2020-12-14 LAB — CMP (CANCER CENTER ONLY)
ALT: 57 U/L — ABNORMAL HIGH (ref 0–44)
AST: 52 U/L — ABNORMAL HIGH (ref 15–41)
Albumin: 3.9 g/dL (ref 3.5–5.0)
Alkaline Phosphatase: 159 U/L — ABNORMAL HIGH (ref 38–126)
Anion gap: 11 (ref 5–15)
BUN: 12 mg/dL (ref 6–20)
CO2: 25 mmol/L (ref 22–32)
Calcium: 9.6 mg/dL (ref 8.9–10.3)
Chloride: 108 mmol/L (ref 98–111)
Creatinine: 0.78 mg/dL (ref 0.61–1.24)
GFR, Estimated: >60 mL/min (ref >60)
Glucose, Bld: 69 mg/dL — ABNORMAL LOW (ref 70–99)
Potassium: 4.1 mmol/L (ref 3.5–5.1)
Sodium: 144 mmol/L (ref 135–145)
Total Bilirubin: 1.2 mg/dL (ref 0.3–1.2)
Total Protein: 6.8 g/dL (ref 6.5–8.1)

## 2020-12-14 LAB — RETIC PANEL
Immature Retic Fract: 26.6 % — ABNORMAL HIGH (ref 2.3–15.9)
RBC.: 3.1 MIL/uL — ABNORMAL LOW (ref 4.22–5.81)
Retic Count, Absolute: 195.9 10*3/uL — ABNORMAL HIGH (ref 19.0–186.0)
Retic Ct Pct: 6.3 % — ABNORMAL HIGH (ref 0.4–3.1)
Reticulocyte Hemoglobin: 34.4 pg (ref >27.9)

## 2020-12-14 LAB — CBC WITH DIFFERENTIAL (CANCER CENTER ONLY)
Abs Immature Granulocytes: 0.13 10*3/uL — ABNORMAL HIGH (ref 0.00–0.07)
Basophils Absolute: 0.1 10*3/uL (ref 0.0–0.1)
Basophils Relative: 1 %
Eosinophils Absolute: 0.2 10*3/uL (ref 0.0–0.5)
Eosinophils Relative: 2 %
HCT: 33.4 % — ABNORMAL LOW (ref 39.0–52.0)
Hemoglobin: 11.7 g/dL — ABNORMAL LOW (ref 13.0–17.0)
Immature Granulocytes: 2 %
Lymphocytes Relative: 12 %
Lymphs Abs: 1 10*3/uL (ref 0.7–4.0)
MCH: 37.7 pg — ABNORMAL HIGH (ref 26.0–34.0)
MCHC: 35 g/dL (ref 30.0–36.0)
MCV: 107.7 fL — ABNORMAL HIGH (ref 80.0–100.0)
Monocytes Absolute: 1.4 10*3/uL — ABNORMAL HIGH (ref 0.1–1.0)
Monocytes Relative: 18 %
Neutro Abs: 5.1 10*3/uL (ref 1.7–7.7)
Neutrophils Relative %: 65 %
Platelet Count: 166 10*3/uL (ref 150–400)
RBC: 3.1 MIL/uL — ABNORMAL LOW (ref 4.22–5.81)
RDW: 15.4 % (ref 11.5–15.5)
WBC Count: 7.9 10*3/uL (ref 4.0–10.5)
nRBC: 0 % (ref 0.0–0.2)

## 2020-12-14 LAB — DIRECT ANTIGLOBULIN TEST (NOT AT ARMC)
DAT, IgG: NEGATIVE
DAT, complement: NEGATIVE

## 2020-12-14 LAB — SAVE SMEAR(SSMR), FOR PROVIDER SLIDE REVIEW

## 2020-12-14 LAB — LACTATE DEHYDROGENASE: LDH: 264 U/L — ABNORMAL HIGH (ref 98–192)

## 2020-12-14 NOTE — Progress Notes (Signed)
[place] Telephone:([phone]) [phone]   Fax:([phone]) [place] NOTE  Patient Care Team: [name], MD as PCP - General (Family Medicine) Kennith Center, RD as Dietitian (Family Medicine) Wilfred Curtis, [facility] as Counselor (Psychology) Corena Pilgrim, MD as Consulting Physician (Psychiatry) [name], MD (Inactive) as Consulting Physician (Urology) Ladene Artist, MD as Consulting Physician (Gastroenterology) Dohmeier, Asencion Partridge, MD as Consulting Physician (Neurology)  Hematological/Oncological History # Macrocytic Anemia # Mild Thrombocytopenia 01/04/2013: WBC 8.8, Hgb 14.7, Plt 261, MCV 98.4 07/01/2013: WBC 4.8, Hgb 12.1, MCV 101.2, Plt 162 06/04/2017: WBC 5.1, Hgb 11.8, MCV 104, Plt 77 09/08/2020: WBC 4.9, Hgb 11.7, MCV 110.3, Plt 68 12/11/2020: WBC 6.2, Hgb 9.1, MCV 110, Plt 141 12/14/2020: establish care with Dr. Lorenso Courier   CHIEF COMPLAINTS/PURPOSE OF CONSULTATION:  "Pancytopenia "  HISTORY OF PRESENTING ILLNESS:  [name] [name] 60 y.o. male with medical history significant for NASH cirrhosis, DM type II, Crohn's disease, HTN, HLD, and sleep apnea who presents for evaluation of pancytopenia.  On review of the previous records [name] [name] has a longstanding history of macrocytosis dating back to at least January 2015.  At that time he had an MCV of 101.2 with a hemoglobin of 12.1.  He subsequently developed drops in his platelet counts bottoming out around platelet count of 60.  One of his lowest on record was recently on 09/08/2020 when he had a platelet count of 68.  Most recently the patient is also developed anemia.  On 12/11/2020 the patient was found to have a hemoglobin 9.1, MCV 110, and a platelet count of 141.  Due to concern for these alterations in his blood count the patient was referred to hematology for further evaluation management.  On exam today [name] [name] reports that he feels quite well.  He reports approximately 5 miles per day.  He denies  having issues with shortness of breath or fatigue.  He also notes that he has not had any recent bleeding.  He notes that he does have some blood in the urine for which he is followed by a urologist and is thought to be due to his history of kidney stones.  He notes that he tends to bleed and bruise easily.  On further discussion he notes that he does not have any dietary restrictions but he tries to avoid red meat and pork.  He reports that he does not eat that often only eats about 1 meal per day.  He notes that he has lost weight over the last year.  Part of this was due to a period of time where he was on a liquid diet due to oral surgery.  He notes that his sister and cousin both have a history of cirrhosis.  His father has history of hypertension.  He is a never smoker never drinker and works in the Conservator, museum/gallery for [employer].  He otherwise denies any fevers, chills, sweats, nausea, vomiting or diarrhea.  A full 10 point ROS is listed below.  MEDICAL HISTORY:  Past Medical History:  Diagnosis Date   Adenomatous colon polyp 10/1999   Anxiety    Arthritis    neck, yoga helps.   Cataract    Complex partial seizure ([place])    last seizure was 08-30-2018   Crohn's disease of small and large intestines ([place]) 1999   Depression    Diabetes mellitus    no meds at this time 10-24-17   Esophageal stricture    External  hemorrhoids    GERD (gastroesophageal reflux disease)    Glaucoma    History of kidney stones    3 large stones still present   Hyperlipemia    Hypertension    resolved with weight loss    Inguinal hernia, bilateral 12/2019   Liver cirrhosis secondary to NASH ([place])    Migraines    Osteoporosis    PONV (postoperative nausea and vomiting)    PTSD (post-traumatic stress disorder)    Renal cyst, acquired, left 07/08/2017   Skin cancer    squamous cell multiple; [place]; followed every 3 months.   Sleep apnea    CPAP machine, uses nightly   Staphylococcus aureus bacteremia  with sepsis ([place]) 05/28/2017   MRSA 2012    SURGICAL HISTORY: Past Surgical History:  Procedure Laterality Date   CATARACT EXTRACTION Bilateral    COLONOSCOPY     DEBRIDEMENT MANDIBLE N/A 12/07/2020   Procedure: DEBRIDEMENT OF MANDIBLE;  Surgeon: [name], DMD;  Location: [place];  Service: Oral Surgery;  Laterality: N/A;   ELBOW SURGERY  2012   elbow MRSA infection    EYE SURGERY     cataracts removed, /w IOL   INCISION AND DRAINAGE ABSCESS N/A 12/07/2020   Procedure: INCISION AND DRAINAGE MANDIBULAR ABSCESS;  Surgeon: [name], DMD;  Location: [place];  Service: Oral Surgery;  Laterality: N/A;   INGUINAL HERNIA REPAIR Right 01/14/2020   Procedure: OPEN RIGHT HERNIA REPAIR INGUINAL WITH MESH;  Surgeon: [name], MD;  Location: [facility];  Service: General;  Laterality: Right;   INSERTION OF MESH N/A 07/01/2016   Procedure: INSERTION OF MESH;  Surgeon: [name], MD;  Location: [place];  Service: General;  Laterality: N/A;   IR URETERAL STENT LEFT NEW ACCESS W/O SEP NEPHROSTOMY CATH  02/02/2018   NEPHROLITHOTOMY Left 02/02/2018   Procedure: NEPHROLITHOTOMY PERCUTANEOUS;  Surgeon: [name], MD;  Location: [facility];  Service: Urology;  Laterality: Left;   POLYPECTOMY     SCALP LACERATION REPAIR Right 10/16/2017   From fall/staples   SHOULDER ARTHROSCOPY Right 03/05/2018   Procedure: ARTHROSCOPY SHOULDER AND OPEN DISTAL CLAVICLE EXCISION;  Surgeon: [name], MD;  Location: [place];  Service: Orthopedics;  Laterality: Right;   SHOULDER SURGERY     SINUS SURGERY WITH INSTATRAK     TEE WITHOUT CARDIOVERSION N/A 05/09/2017   Procedure: TRANSESOPHAGEAL ECHOCARDIOGRAM (TEE);  Surgeon: [name], MD;  Location: [facility];  Service: Cardiovascular;  Laterality: N/A;   TOOTH EXTRACTION N/A 12/07/2020   Procedure: DENTAL EXTRACTIONS OF TEETH TWENTY TO THIRTY;  Surgeon: [name], DMD;  Location: [place];  Service: Oral Surgery;  Laterality: N/A;    TRANSTHORACIC ECHOCARDIOGRAM  02/22/2011   EF 55-65%; increased pattern of LVH with mild conc hypertrophy, abnormal relaxation & increased filling pressure (grade 2 diastolic dysfunction); atrial septum thickened (lipomatous hypertrophy)   UMBILICAL HERNIA REPAIR N/A 07/01/2016   Procedure: UMBILICAL HERNIA REPAIR WITH MESH;  Surgeon: [name], MD;  Location: [place];  Service: General;  Laterality: N/A;   UPPER GASTROINTESTINAL ENDOSCOPY     VASECTOMY      SOCIAL HISTORY: Social History   Socioeconomic History   Marital status: Married    Spouse name: [name]   Number of children: 2   Years of education: college   Highest education level: Not on file  Occupational History   Occupation: disabled    Comment: seizure disorder with memory impairment  Tobacco Use   Smoking status:  Never   Smokeless tobacco: Never  Vaping Use   Vaping Use: Never used  Substance and Sexual Activity   Alcohol use: No   Drug use: No   Sexual activity: Yes    Birth control/protection: Surgical    Comment: 1 partner in last 12 months  Other Topics Concern   Not on file  Social History Narrative   Marital status: married x 32 years;       Lives:   lives at home with his family.      Children:  two children; no grandchildren.  Son is special needs at age 17yo; Cornelian Delaine with TOF.       Employment: unemployed; [employer] long term disability      Tobacco: none      Alcohol: none      Drugs: none      Exercise: unable to exercise due to frequent falls   Patient has a college education.   Patient is right-handed.   Patient drinks one cup of soda daily.   Social Determinants of Health   Financial Resource Strain: Not on file  Food Insecurity: Not on file  Transportation Needs: Not on file  Physical Activity: Not on file  Stress: Not on file  Social Connections: Not on file  Intimate Partner Violence: Not on file    FAMILY HISTORY: Family History  Problem Relation Age of Onset    Colon polyps Father    Stroke Father    Hyperlipidemia Father    Hypertension Father    Heart disease Mother        CABG at age 66   Hyperlipidemia Mother    Hypertension Mother    Stroke Paternal Grandmother    Stroke Paternal Grandfather    Other Child        tetrology of fallot (cornealia deland syndrome)   Colon cancer Neg Hx    Esophageal cancer Neg Hx    Stomach cancer Neg Hx    Rectal cancer Neg Hx     ALLERGIES:  is allergic to azithromycin, beef-derived products, milk-related compounds, penicillins, sulfonamide derivatives, and dilaudid [hydromorphone hcl].  MEDICATIONS:  Current Outpatient Medications  Medication Sig Dispense Refill   B Complex Vitamins (VITAMIN B COMPLEX) TABS Take 1 tablet by mouth every evening. (Patient not taking: Reported on 12/22/2020)     Calcium Carbonate-Vitamin D (CALCIUM 600+D PO) Take 1 tablet by mouth 2 (two) times daily.     chlorhexidine (PERIDEX) 0.12 % solution Use as directed 15 mLs in the mouth or throat 4 (four) times daily. (Patient not taking: No sig reported) 120 mL 0   cholecalciferol (VITAMIN D) 1000 units tablet Take 1,000 Units at bedtime by mouth.      ertapenem (INVANZ) IVPB Inject 1 g into the vein daily. Indication:  Mandibular osteomyelitis  First Dose: Yes Last Day of Therapy:  01/18/21 Labs - Once weekly:  CBC/D and BMP, Labs - Every other week:  ESR and CRP Method of administration: Mini-Bag Plus / Gravity Method of administration may be changed at the discretion of home infusion pharmacist based upon assessment of the patient and/or caregiver's ability to self-administer the medication ordered. 41 Units 0   esomeprazole (NEXIUM) 40 MG capsule TAKE 1 CAPSULE TWICE DAILY (Patient taking differently: Take 40 mg by mouth 2 (two) times daily before a meal.) 180 capsule 0   Eszopiclone 3 MG TABS Take 3 mg by mouth at bedtime. Take immediately before bedtime  feeding supplement (ENSURE ENLIVE / ENSURE PLUS) LIQD Take 237 mLs  by mouth 2 (two) times daily between meals. 237 mL 12   fluconazole (DIFLUCAN) 200 MG tablet Take 2 tablets (400 mg total) by mouth daily. 60 tablet 1   folic acid (FOLVITE) 1 MG tablet Take 2 tablets (2 mg total) by mouth daily. 180 tablet 3   gabapentin (NEURONTIN) 300 MG capsule Take 1 capsule (300 mg total) by mouth daily. 30 capsule 0   glucose blood (CHOICE DM FORA G20 TEST STRIPS) test strip Use as instructed 100 each 12   HYDROcodone-acetaminophen (NORCO/VICODIN) 5-325 MG tablet Take 1 tablet by mouth every 4 (four) hours as needed for moderate pain.     Lactase (DAIRY RELIEF PO) Take 2 tablets by mouth in the morning and at bedtime.     lamoTRIgine (LAMICTAL) 150 MG tablet Take 1 tablet (150 mg total) by mouth 2 (two) times daily. 180 tablet 3   latanoprost (XALATAN) 0.005 % ophthalmic solution Place 1 drop into both eyes at bedtime.      lidocaine (XYLOCAINE) 2 % solution Use as directed 15 mLs in the mouth or throat as needed for mouth pain. (Patient not taking: Reported on 12/22/2020) 100 mL 0   mercaptopurine (PURINETHOL) 50 MG tablet TAKE 2 TABLETS DAILY ON AN EMPTY STOMACH 1 HOUR BEFOREOR 2 HOURS AFTER A MEAL.   CAUTION: CHEMOTHERAPY 180 tablet 0   mesalamine (LIALDA) 1.2 g EC tablet TAKE 2 TABLETS DAILY WITH  BREAKFAST (Patient taking differently: Take 2.4 g by mouth daily with breakfast.) 180 tablet 0   methscopolamine (PAMINE FORTE) 5 MG tablet TAKE 1 TABLET TWICE A DAY (Patient taking differently: Take 5 mg by mouth 2 (two) times daily.) 180 tablet 0   mirabegron ER (MYRBETRIQ) 25 MG TB24 tablet Take 25 mg by mouth at bedtime.     Multiple Vitamin (MULTIVITAMIN WITH MINERALS) TABS tablet Take 1 tablet by mouth daily. 30 tablet 0   niacinamide 100 MG tablet Take 100 mg by mouth 2 (two) times daily.     ondansetron (ZOFRAN) 4 MG tablet Take 1 tablet (4 mg total) by mouth every 6 (six) hours as needed for nausea. 20 tablet 0   ONE TOUCH ULTRA TEST test strip USE AS INSTRUCTED 100 each  2   potassium chloride SA (KLOR-CON) 20 MEQ tablet Take 1 tablet (20 mEq total) by mouth 2 (two) times daily. 180 tablet 3   Probiotic Product (PROBIOTIC PO) Take 1 tablet at bedtime by mouth.      sertraline (ZOLOFT) 100 MG tablet Take 200 mg daily with breakfast by mouth.      tamsulosin (FLOMAX) 0.4 MG CAPS capsule Take 0.4 mg by mouth at bedtime as needed (blood in urine).     traZODone (DESYREL) 150 MG tablet Take 150 mg by mouth at bedtime.     vitamin C (ASCORBIC ACID) 500 MG tablet Take 500 mg by mouth daily. (Patient not taking: Reported on 12/22/2020)     XIFAXAN 550 MG TABS tablet TAKE 1 TABLET TWICE A DAY (Patient taking differently: Take 550 mg by mouth 2 (two) times daily.) 180 tablet 0   No current facility-administered medications for this visit.   Facility-Administered Medications Ordered in Other Visits  Medication Dose Route Frequency Provider Last Rate Last Admin   gadopentetate dimeglumine (MAGNEVIST) injection 20 mL  20 mL Intravenous Once PRN Marcial Pacas, MD        REVIEW OF SYSTEMS:   Constitutional: ( - )  fevers, ( - )  chills , ( - ) night sweats Eyes: ( - ) blurriness of vision, ( - ) double vision, ( - ) watery eyes Ears, nose, mouth, throat, and face: ( - ) mucositis, ( - ) sore throat Respiratory: ( - ) cough, ( - ) dyspnea, ( - ) wheezes Cardiovascular: ( - ) palpitation, ( - ) chest discomfort, ( - ) lower extremity swelling Gastrointestinal:  ( - ) nausea, ( - ) heartburn, ( - ) change in bowel habits Skin: ( - ) abnormal skin rashes Lymphatics: ( - ) new lymphadenopathy, ( - ) easy bruising Neurological: ( - ) numbness, ( - ) tingling, ( - ) new weaknesses Behavioral/Psych: ( - ) mood change, ( - ) new changes  All other systems were reviewed with the patient and are negative.  PHYSICAL EXAMINATION:   Vitals:   12/14/20 1256  BP: 139/75  Pulse: 80  Resp: 18  Temp: 98.4 F (36.9 C)  SpO2: 98%   Filed Weights   12/14/20 1256  Weight: 175 lb 14.4  oz (79.8 kg)    GENERAL: well appearing middle aged Caucasian male in NAD  SKIN: skin color, texture, turgor are normal, no rashes or significant lesions EYES: conjunctiva are pink and non-injected, sclera clear LUNGS: clear to auscultation and percussion with normal breathing effort HEART: regular rate & rhythm and no murmurs and no lower extremity edema PSYCH: alert & oriented x 3, fluent speech NEURO: no focal motor/sensory deficits  LABORATORY DATA:  I have reviewed the data as listed CBC Latest Ref Rng & Units 12/14/2020 12/11/2020 12/10/2020  WBC 4.0 - 10.5 K/uL 7.9 6.2 3.2(L)  Hemoglobin 13.0 - 17.0 g/dL 11.7(L) 9.1(L) 8.9(L)  Hematocrit 39.0 - 52.0 % 33.4(L) 26.4(L) 25.3(L)  Platelets 150 - 400 K/uL 166 141(L) 117(L)    CMP Latest Ref Rng & Units 12/14/2020 12/09/2020 12/07/2020  Glucose 70 - 99 mg/dL 69(L) 85 126(H)  BUN 6 - 20 mg/dL 12 12 10   Creatinine 0.61 - 1.24 mg/dL 0.78 0.78 0.50(L)  Sodium 135 - 145 mmol/L 144 141 139  Potassium 3.5 - 5.1 mmol/L 4.1 3.4(L) 4.1  Chloride 98 - 111 mmol/L 108 107 106  CO2 22 - 32 mmol/L 25 27 -  Calcium 8.9 - 10.3 mg/dL 9.6 8.4(L) -  Total Protein 6.5 - 8.1 g/dL 6.8 - -  Total Bilirubin 0.3 - 1.2 mg/dL 1.2 - -  Alkaline Phos 38 - 126 U/L 159(H) - -  AST 15 - 41 U/L 52(H) - -  ALT 0 - 44 U/L 57(H) - -    RADIOGRAPHIC STUDIES: DG Orthopantogram  Result Date: 12/06/2020 CLINICAL DATA:  Jaw pain, initial encounter EXAM: ORTHOPANTOGRAM/PANORAMIC COMPARISON:  Maxillofacial CT from the previous day. FINDINGS: Panoramic view of the mandible shows no acute fracture. Dental hardware is noted bilaterally. No periapical abscess is seen. No acute abnormality is noted. IMPRESSION: No acute abnormality seen. Electronically Signed   By: Inez Catalina M.D.   On: 12/06/2020 09:17   CT Head Wo Contrast  Result Date: 12/04/2020 CLINICAL DATA:  Acute onset altered mental status. EXAM: CT HEAD WITHOUT CONTRAST TECHNIQUE: Contiguous axial images were  obtained from the base of the skull through the vertex without intravenous contrast. COMPARISON:  09/08/2020 FINDINGS: Brain: No evidence of acute infarction, hemorrhage, hydrocephalus, extra-axial collection or mass lesion/mass effect. Mild atrophy most prominent in the frontal regions. Vascular: Mild intracranial arterial vascular calcifications. Skull: Calvarium appears intact. Sinuses/Orbits: Paranasal sinuses  and mastoid air cells are clear. Other: No significant changes since previous study. IMPRESSION: No acute intracranial abnormalities.  Mild cerebral atrophy. Electronically Signed   By: Lucienne Capers M.D.   On: 12/04/2020 21:00   CT MAXILLOFACIAL W CONTRAST  Result Date: 12/05/2020 CLINICAL DATA:  The lies of the face. EXAM: CT MAXILLOFACIAL WITH CONTRAST TECHNIQUE: Multidetector CT imaging of the maxillofacial structures was performed with intravenous contrast. Multiplanar CT image reconstructions were also generated. CONTRAST:  169m OMNIPAQUE IOHEXOL 300 MG/ML  SOLN COMPARISON:  None. FINDINGS: Osseous: No fracture or mandibular dislocation. No destructive process. Orbits: Negative. No traumatic or inflammatory finding. Sinuses: Mild scattered ethmoid air cell mucosal thickening. Right frontal sinus osteoma. Remaining sinuses are clear. No mastoid effusions. Soft tissues: Edema and soft tissue thickening overlying the right anterior mandible, compatible with cellulitis/phlegmon. Streak artifact from adjacent dental amalgam limits evaluation without visible drainable fluid collection. Limited intracranial: No obvious acute intracranial abnormality in the visualized brain on limited assessment. IMPRESSION: Edema and soft tissue thickening overlying the right anterior mandible, compatible with cellulitis/phlegmon. Streak artifact from adjacent dental amalgam limits evaluation without visible drainable fluid collection. Electronically Signed   By: FMargaretha SheffieldMD   On: 12/05/2020 14:49   DG  Chest Port 1 View  Result Date: 12/04/2020 CLINICAL DATA:  Right-sided facial pain.  Febrile. EXAM: PORTABLE CHEST 1 VIEW COMPARISON:  09/08/2020 FINDINGS: Shallow inspiration with elevation of the right hemidiaphragm. Heart size and pulmonary vascularity are normal for technique. No airspace disease or consolidation in the lungs. No pleural effusions. No pneumothorax. Multiple old appearing right rib fractures. Postoperative changes in the distal right clavicle. Calcification of the aorta. Degenerative changes in the spine and shoulders. IMPRESSION: Shallow inspiration.  No evidence of active pulmonary disease. Electronically Signed   By: WLucienne CapersM.D.   On: 12/04/2020 20:44   TEMPORAL ARTERY  Result Date: 12/06/2020  TEMPORAL ARTERY REPORT Patient Name:  [name] [name] Date of Exam:   12/06/2020 Medical Rec #: [id number]     Accession #:    [id number] of Birth: [birth year]     Patient Gender: M Patient Age:   56YExam [facility]:      VAS [place]ARTERY BILATERAL Referring Phys: [id number] JENNIFER YATES --------------------------------------------------------------------------------  Indications: Jaw pain, throat pain and scalp tenderness. High Risk Factors: Age > 50 yrs.  Performing Technologist: MMaudry MayhewMHA, RDMS, RVT, RDCS  Examination Guidelines: Patient in reclined position. 2D, color and spectral doppler sampling in the temporal artery along the hairline and temple in the longitudinal plane. 2D images along the hairline and temple in the transverse plane. Exam is bilateral.  Summary: Absence of a "halo" sign in the bilateral temporal artery, although not definitive, makes a diagnosis of temporal arteritis unlikely.  *See table(s) above for measurements and observations.  Electronically signed by VHarold BarbanMD on 12/06/2020 at 7:50:31 PM.   Final    UK[place]EKG SITE RITE  Result Date: 12/08/2020 If Site Rite image not attached, placement could not be  confirmed due to current cardiac rhythm.   ASSESSMENT & PLAN [name] MATHESON89y.o. male with medical history significant for NASH cirrhosis, DM type II, Crohn's disease, HTN, HLD, and sleep apnea who presents for evaluation of pancytopenia.  After review of the labs, review of the records, and discussion with the patient the patients findings are most consistent with a multifactorial pancytopenia.  Prior nutritional work-up with vitamin  B12 and folate showed no abnormalities on 12/07/2020.  On the same day an iron panel was ordered which showed a markedly decreased TIBC at 207 saturation ratio of 17% with a ferritin of 607.  This is most consistent with anemia of chronic disease.  There are other possible etiologies for this including copper deficiency as well as lysis labs with DAT, LDH, and haptoglobin.  At this time we recommend best supportive care for this patient's liver disease.  A bone marrow biopsy could be considered, however I imagine this would be lower yield in the setting of the other possible etiologies for his pancytopenia.  In the event we find any abnormalities in the blood work below we will have the patient return to clinic for further evaluation.  # Macrocytic Anemia # Mild Thrombocytopenia # Leukopenia/Lymphocytopenia -- At this time findings are most consistent with a multifactorial pancytopenia, mostly stemming from his [name] cirrhosis. --In order to rule out other possible etiologies we will do a nutritional panel to include copper,  methylmalonic acid, and homocystine. (Normal B12 and folate on 12/07/2020)  --We will also order reticulocyte panel and peripheral blood smear --bone marrow biopsy could be indicated in this setting if the above workup was negative. It would likely be low yield in the setting of other possible etiologies --RTC PRN.   Orders Placed This Encounter  Procedures   Haptoglobin    Standing Status:   Future    Number of Occurrences:   1    Standing  Expiration Date:   12/14/2021   Lactate dehydrogenase (LDH)    Standing Status:   Future    Number of Occurrences:   1    Standing Expiration Date:   12/14/2021   CBC with Differential ([facility] Only)    Standing Status:   Future    Number of Occurrences:   1    Standing Expiration Date:   12/14/2021   CMP ([facility] only)    Standing Status:   Future    Number of Occurrences:   1    Standing Expiration Date:   12/14/2021   Methylmalonic acid, serum    Standing Status:   Future    Number of Occurrences:   1    Standing Expiration Date:   12/14/2021   Homocysteine, serum    Standing Status:   Future    Number of Occurrences:   1    Standing Expiration Date:   12/14/2021   Retic Panel    Standing Status:   Future    Number of Occurrences:   1    Standing Expiration Date:   12/14/2021   Save Smear (SS[facility])    Standing Status:   Future    Number of Occurrences:   1    Standing Expiration Date:   12/14/2021   Copper, serum    Standing Status:   Future    Number of Occurrences:   1    Standing Expiration Date:   12/14/2021   Multiple Myeloma Panel (SPEP&IFE w/QIG)    Standing Status:   Future    Number of Occurrences:   1    Standing Expiration Date:   12/14/2021   Kappa/lambda light chains    Standing Status:   Future    Number of Occurrences:   1    Standing Expiration Date:   12/14/2021   Direct antiglobulin test ([facility])    Standing Status:   Future    Number of Occurrences:  1    Standing Expiration Date:   12/14/2021   All questions were answered. The patient knows to call the clinic with any problems, questions or concerns.  A total of more than 60 minutes were spent on this encounter with face-to-face time and non-face-to-face time, including preparing to see the patient, ordering tests and/or medications, counseling the patient and coordination of care as outlined above.   Ledell Peoples, MD Department of Hematology/Oncology [place] at [facility] Phone: [phone] Pager: [phone] Email: [email or web address]  12/24/2020 6:46 PM

## 2020-12-15 LAB — COPPER, SERUM: Copper: 110 ug/dL (ref 69–132)

## 2020-12-15 LAB — KAPPA/LAMBDA LIGHT CHAINS
Kappa free light chain: 16.9 mg/L (ref 3.3–19.4)
Kappa, lambda light chain ratio: 1.08 (ref 0.26–1.65)
Lambda free light chains: 15.7 mg/L (ref 5.7–26.3)

## 2020-12-15 LAB — HAPTOGLOBIN: Haptoglobin: <10 mg/dL — ABNORMAL LOW (ref 29–370)

## 2020-12-15 LAB — HOMOCYSTEINE: Homocysteine: 10.4 umol/L (ref 0.0–14.5)

## 2020-12-16 LAB — METHYLMALONIC ACID, SERUM: Methylmalonic Acid, Quantitative: 132 nmol/L (ref 0–378)

## 2020-12-18 ENCOUNTER — Telehealth: Payer: Self-pay | Admitting: Gastroenterology

## 2020-12-18 DIAGNOSIS — K7460 Unspecified cirrhosis of liver: Secondary | ICD-10-CM

## 2020-12-18 LAB — MULTIPLE MYELOMA PANEL, SERUM
Albumin SerPl Elph-Mcnc: 3.8 g/dL (ref 2.9–4.4)
Albumin/Glob SerPl: 1.6 (ref 0.7–1.7)
Alpha 1: 0.3 g/dL (ref 0.0–0.4)
Alpha2 Glob SerPl Elph-Mcnc: 0.5 g/dL (ref 0.4–1.0)
B-Globulin SerPl Elph-Mcnc: 0.8 g/dL (ref 0.7–1.3)
Gamma Glob SerPl Elph-Mcnc: 0.7 g/dL (ref 0.4–1.8)
Globulin, Total: 2.4 g/dL (ref 2.2–3.9)
IgA: 175 mg/dL (ref 90–386)
IgG (Immunoglobin G), Serum: 789 mg/dL (ref 603–1613)
IgM (Immunoglobulin M), Srm: 41 mg/dL (ref 20–172)
Total Protein ELP: 6.2 g/dL (ref 6.0–8.5)

## 2020-12-18 NOTE — Telephone Encounter (Signed)
Patient notified he will be contacted directly by [facility] scheduled to schedule his [place].  He can come for labs the same day as his [place].

## 2020-12-18 NOTE — Telephone Encounter (Signed)
Inbound call from patient asking when patient is due to repeat abdominal ultrasound and labs.  Please advise.

## 2020-12-21 ENCOUNTER — Telehealth: Payer: Self-pay | Admitting: *Deleted

## 2020-12-21 NOTE — Telephone Encounter (Signed)
Per Dr Libby Maw direction notified patient of his lab results.  He had no further questions at this time.

## 2020-12-21 NOTE — Telephone Encounter (Signed)
-----   Message from Otila Kluver, RN sent at 12/21/2020  3:03 PM EDT -----  ----- Message ----- From: Orson Slick, MD Sent: 12/21/2020   7:22 AM EDT To: Otila Kluver, RN  Please let Mr. Clauson know that his blood counts are rebounding to back to normal. The drop may have been due to blood loss from his oral surgery (note reports 350 cc of blood loss). He is making new cells and Hgb has near normalized. His other counts appear mildly low, but at baseline. Most likely due to his cirrhosis. There is no need for routine f/u in our clinic.   ----- Message ----- From: [place]: 12/14/2020   2:32 PM EDT To: Orson Slick, MD

## 2020-12-24 ENCOUNTER — Encounter: Payer: Self-pay | Admitting: Hematology and Oncology

## 2020-12-26 ENCOUNTER — Telehealth (INDEPENDENT_AMBULATORY_CARE_PROVIDER_SITE_OTHER): Payer: 59 | Admitting: Internal Medicine

## 2020-12-26 ENCOUNTER — Other Ambulatory Visit: Payer: Self-pay

## 2020-12-26 ENCOUNTER — Encounter: Payer: Self-pay | Admitting: Internal Medicine

## 2020-12-26 DIAGNOSIS — M272 Inflammatory conditions of jaws: Secondary | ICD-10-CM

## 2020-12-26 NOTE — Assessment & Plan Note (Signed)
Cultures noted and on appropriate medication.  Will continue and with planned procedure this week, will wait to hear from Dr. Conley Simmonds if further infection noted and debrided and will consider extension of the antibiotics, if so.   Will see again prior to July 28th and reassess

## 2020-12-26 NOTE — Progress Notes (Signed)
   Subjective:    Patient ID: [name] [name], male    DOB: [birth year], 60 y.o.   MRN: [id number]  HPI I connected with  [name] [name] on 12/26/20 by a video enabled telemedicine application and verified that I am speaking with the correct person using two identifiers.   I discussed the limitations of evaluation and management by telemedicine. The patient expressed understanding and agreed to proceed.  Location: Patient - [place] [facility] Physician - clinic  Duration of visit:  15 minutes  HPI:  He has a history of Crohn's disease, DM and developed mandibular osteomyelitis s/p debridement by Dr. Conley Simmonds.  He grew Strep intermedius on blood cultures as well as operative cultures and also grew Candida.  He is on ertapenem once daily and oral fluconazole for mixed infection.  Overall he is doing better but still with pain.  His wife, on the call as well, reported that he has more surgery scheduled for this Friday with concern for more osteomyelitis based on a recent scan in the office.  Plan at this time is to complete antibiotics July 28th.  He is tolerating antibiotics with no fever, no rash, no diarrhea.     Review of Systems  Constitutional:  Negative for unexpected weight change.  Gastrointestinal:  Negative for diarrhea and nausea.  Skin:  Negative for rash.      Objective:   Physical Exam Eyes:     General: No scleral icterus. Pulmonary:     Effort: Pulmonary effort is normal.  Neurological:     Mental Status: He is alert.          Assessment & Plan:

## 2020-12-27 ENCOUNTER — Ambulatory Visit (HOSPITAL_COMMUNITY)
Admission: RE | Admit: 2020-12-27 | Discharge: 2020-12-27 | Disposition: A | Discharge status: Home / Self Care | Payer: 59 | Source: Ambulatory Visit | Attending: Gastroenterology | Admitting: Gastroenterology

## 2020-12-27 ENCOUNTER — Other Ambulatory Visit: Payer: Self-pay

## 2020-12-27 DIAGNOSIS — K7460 Unspecified cirrhosis of liver: Secondary | ICD-10-CM | POA: Insufficient documentation

## 2020-12-28 ENCOUNTER — Encounter (HOSPITAL_COMMUNITY): Payer: Self-pay | Admitting: Oral Surgery

## 2020-12-28 ENCOUNTER — Other Ambulatory Visit (HOSPITAL_COMMUNITY)
Admission: RE | Admit: 2020-12-28 | Discharge: 2020-12-28 | Disposition: A | Discharge status: Home / Self Care | Payer: 59 | Source: Ambulatory Visit | Attending: Oral Surgery | Admitting: Oral Surgery

## 2020-12-28 ENCOUNTER — Other Ambulatory Visit: Payer: Self-pay

## 2020-12-28 DIAGNOSIS — Z20822 Contact with and (suspected) exposure to covid-19: Secondary | ICD-10-CM | POA: Insufficient documentation

## 2020-12-28 DIAGNOSIS — Z01812 Encounter for preprocedural laboratory examination: Secondary | ICD-10-CM | POA: Insufficient documentation

## 2020-12-28 LAB — SARS CORONAVIRUS 2 (TAT 6-24 HRS): SARS Coronavirus 2: NEGATIVE

## 2020-12-28 NOTE — Progress Notes (Signed)
PCP - Dr. Orland Mustard Cardiologist - denies EKG - 12/04/20 Chest x-ray - 12/04/20 ECHO - 05/09/17 Cardiac Cath -  CPAP - yes  Fasting Blood Sugar:  85-90s Checks Blood Sugar:  1x/wk   COVID TEST- pending  Anesthesia review: yes  -------------  SDW INSTRUCTIONS:  Your procedure is scheduled on 7/8 Friday. Please report to [facility] Main Entrance "A" at 12:30 P.M., and check in at the Admitting office. Call this number if you have problems the morning of surgery: [phone]   Remember: Do not eat or drink after midnight the night before your surgery   Medications to take morning of surgery with a sip of water include: esomeprazole (NEXIUM) fluconazole (DIFLUCAN) gabapentin (NEURONTIN) lamoTRIgine (LAMICTAL)  methscopolamine (PAMINE FORTE)  sertraline (ZOLOFT) XIFAXAN   HYDROcodone-acetaminophen (NORCO/VICODIN)  if needed   As of today, STOP taking any Aspirin (unless otherwise instructed by your surgeon), Aleve, Naproxen, Ibuprofen, Motrin, Advil, Goody's, BC's, all herbal medications, fish oil, and all vitamins.  ** PLEASE check your blood sugar the morning of your surgery when you wake up and every 2 hours until you get to the Short Stay unit.  If your blood sugar is less than 70 mg/dL, you will need to treat for low blood sugar: Do not take insulin. Treat a low blood sugar (less than 70 mg/dL) with  cup of clear juice (cranberry or apple), 4 glucose tablets, OR glucose gel. Recheck blood sugar in 15 minutes after treatment (to make sure it is greater than 70 mg/dL). If your blood sugar is not greater than 70 mg/dL on recheck, call [phone] for further instructions.    The Morning of Surgery Do not wear jewelry Do not wear lotions, powders, colognes, or deodorant Men may shave face and neck. Do not bring valuables to the hospital. [facility] is not responsible for any belongings or valuables.  If you are a smoker, DO NOT Smoke 24 hours prior to surgery If you  wear a CPAP at night please bring your mask the morning of surgery  Remember that you must have someone to transport you home after your surgery, and remain with you for 24 hours if you are discharged the same day.  Please bring cases for contacts, glasses, hearing aids, dentures or bridgework because it cannot be worn into surgery.   Patients discharged the day of surgery will not be allowed to drive home.   Please shower the NIGHT BEFORE/MORNING OF SURGERY (use antibacterial soap like DIAL soap if possible). Wear comfortable clothes the morning of surgery. Oral Hygiene is also important to reduce your risk of infection.  Remember - BRUSH YOUR TEETH THE MORNING OF SURGERY WITH YOUR REGULAR TOOTHPASTE  Patient denies shortness of breath, fever, cough and chest pain.

## 2020-12-29 ENCOUNTER — Ambulatory Visit (HOSPITAL_COMMUNITY): Payer: 59 | Admitting: Physician Assistant

## 2020-12-29 ENCOUNTER — Other Ambulatory Visit: Payer: Self-pay

## 2020-12-29 ENCOUNTER — Encounter (HOSPITAL_COMMUNITY)
Admission: RE | Disposition: A | Discharge status: Home / Self Care | Payer: Self-pay | Source: Home / Self Care | Attending: Internal Medicine

## 2020-12-29 ENCOUNTER — Observation Stay (HOSPITAL_COMMUNITY)
Admission: RE | Admit: 2020-12-29 | Discharge: 2020-12-30 | Disposition: A | Discharge status: Home / Self Care | Payer: 59 | Attending: Family Medicine | Admitting: Family Medicine

## 2020-12-29 ENCOUNTER — Encounter (HOSPITAL_COMMUNITY): Payer: Self-pay | Admitting: Internal Medicine

## 2020-12-29 DIAGNOSIS — G40209 Localization-related (focal) (partial) symptomatic epilepsy and epileptic syndromes with complex partial seizures, not intractable, without status epilepticus: Secondary | ICD-10-CM | POA: Diagnosis present

## 2020-12-29 DIAGNOSIS — F321 Major depressive disorder, single episode, moderate: Secondary | ICD-10-CM

## 2020-12-29 DIAGNOSIS — K7581 Nonalcoholic steatohepatitis (NASH): Secondary | ICD-10-CM | POA: Diagnosis not present

## 2020-12-29 DIAGNOSIS — M272 Inflammatory conditions of jaws: Secondary | ICD-10-CM | POA: Diagnosis not present

## 2020-12-29 DIAGNOSIS — K219 Gastro-esophageal reflux disease without esophagitis: Secondary | ICD-10-CM | POA: Diagnosis present

## 2020-12-29 DIAGNOSIS — Z85828 Personal history of other malignant neoplasm of skin: Secondary | ICD-10-CM | POA: Insufficient documentation

## 2020-12-29 DIAGNOSIS — D61818 Other pancytopenia: Secondary | ICD-10-CM | POA: Diagnosis not present

## 2020-12-29 DIAGNOSIS — K5090 Crohn's disease, unspecified, without complications: Secondary | ICD-10-CM | POA: Insufficient documentation

## 2020-12-29 DIAGNOSIS — E119 Type 2 diabetes mellitus without complications: Secondary | ICD-10-CM

## 2020-12-29 DIAGNOSIS — K5080 Crohn's disease of both small and large intestine without complications: Secondary | ICD-10-CM | POA: Diagnosis present

## 2020-12-29 DIAGNOSIS — Z79899 Other long term (current) drug therapy: Secondary | ICD-10-CM | POA: Insufficient documentation

## 2020-12-29 DIAGNOSIS — K7460 Unspecified cirrhosis of liver: Secondary | ICD-10-CM

## 2020-12-29 DIAGNOSIS — F32A Depression, unspecified: Secondary | ICD-10-CM | POA: Diagnosis present

## 2020-12-29 HISTORY — PX: GROSS TEETH DEBRIDEMENT: SHX5190

## 2020-12-29 HISTORY — PX: TOOTH EXTRACTION: SHX859

## 2020-12-29 LAB — BASIC METABOLIC PANEL
Anion gap: 10 (ref 5–15)
BUN: 10 mg/dL (ref 6–20)
CO2: 22 mmol/L (ref 22–32)
Calcium: 9.1 mg/dL (ref 8.9–10.3)
Chloride: 107 mmol/L (ref 98–111)
Creatinine, Ser: 0.8 mg/dL (ref 0.61–1.24)
GFR, Estimated: >60 mL/min (ref >60)
Glucose, Bld: 74 mg/dL (ref 70–99)
Potassium: 3.8 mmol/L (ref 3.5–5.1)
Sodium: 139 mmol/L (ref 135–145)

## 2020-12-29 LAB — GLUCOSE, CAPILLARY
Glucose-Capillary: 79 mg/dL (ref 70–99)
Glucose-Capillary: 67 mg/dL — ABNORMAL LOW (ref 70–99)
Glucose-Capillary: 115 mg/dL — ABNORMAL HIGH (ref 70–99)
Glucose-Capillary: 126 mg/dL — ABNORMAL HIGH (ref 70–99)

## 2020-12-29 LAB — CBC WITH DIFFERENTIAL/PLATELET
Abs Immature Granulocytes: 0.01 10*3/uL (ref 0.00–0.07)
Basophils Absolute: 0 10*3/uL (ref 0.0–0.1)
Basophils Relative: 0 %
Eosinophils Absolute: 0.1 10*3/uL (ref 0.0–0.5)
Eosinophils Relative: 6 %
HCT: 29.2 % — ABNORMAL LOW (ref 39.0–52.0)
Hemoglobin: 10.1 g/dL — ABNORMAL LOW (ref 13.0–17.0)
Immature Granulocytes: 0 %
Lymphocytes Relative: 25 %
Lymphs Abs: 0.6 10*3/uL — ABNORMAL LOW (ref 0.7–4.0)
MCH: 37.3 pg — ABNORMAL HIGH (ref 26.0–34.0)
MCHC: 34.6 g/dL (ref 30.0–36.0)
MCV: 107.7 fL — ABNORMAL HIGH (ref 80.0–100.0)
Monocytes Absolute: 0.2 10*3/uL (ref 0.1–1.0)
Monocytes Relative: 8 %
Neutro Abs: 1.4 10*3/uL — ABNORMAL LOW (ref 1.7–7.7)
Neutrophils Relative %: 61 %
Platelets: 55 10*3/uL — ABNORMAL LOW (ref 150–400)
RBC: 2.71 MIL/uL — ABNORMAL LOW (ref 4.22–5.81)
RDW: 14.1 % (ref 11.5–15.5)
WBC: 2.3 10*3/uL — ABNORMAL LOW (ref 4.0–10.5)
nRBC: 0 % (ref 0.0–0.2)

## 2020-12-29 SURGERY — DENTAL RESTORATION/EXTRACTIONS
Anesthesia: General

## 2020-12-29 MED ORDER — SUGAMMADEX SODIUM 200 MG/2ML IV SOLN
INTRAVENOUS | Status: DC | PRN
  Administered 2020-12-29: 200 mg via INTRAVENOUS

## 2020-12-29 MED ORDER — ROCURONIUM BROMIDE 10 MG/ML (PF) SYRINGE
PREFILLED_SYRINGE | INTRAVENOUS | Status: AC
  Filled 2020-12-29: qty 10

## 2020-12-29 MED ORDER — ACETAMINOPHEN 650 MG RE SUPP: 650.0000 mg | Freq: Four times a day (QID) | RECTAL | Status: DC | PRN

## 2020-12-29 MED ORDER — FENTANYL CITRATE (PF) 250 MCG/5ML IJ SOLN
INTRAMUSCULAR | Status: DC | PRN
  Administered 2020-12-29: 100 ug via INTRAVENOUS
  Administered 2020-12-29 (×2): 25 ug via INTRAVENOUS

## 2020-12-29 MED ORDER — METHSCOPOLAMINE BROMIDE 5 MG PO TABS
5.0000 mg | ORAL_TABLET | Freq: Two times a day (BID) | ORAL | Status: DC
  Filled 2020-12-29: qty 1

## 2020-12-29 MED ORDER — LAMOTRIGINE 150 MG PO TABS
150.0000 mg | ORAL_TABLET | Freq: Two times a day (BID) | ORAL | Status: DC
  Administered 2020-12-30 (×2): 150 mg via ORAL
  Filled 2020-12-29 (×3): qty 1

## 2020-12-29 MED ORDER — TRAZODONE HCL 150 MG PO TABS
150.0000 mg | ORAL_TABLET | Freq: Every evening | ORAL | Status: DC | PRN
  Administered 2020-12-30: 150 mg via ORAL
  Filled 2020-12-29 (×2): qty 1

## 2020-12-29 MED ORDER — PROPOFOL 10 MG/ML IV BOLUS
INTRAVENOUS | Status: AC
  Filled 2020-12-29: qty 20

## 2020-12-29 MED ORDER — DEXTROSE 50 % IV SOLN
25.0000 mL | Freq: Once | INTRAVENOUS | Status: AC
  Administered 2020-12-29: 25 mL via INTRAVENOUS

## 2020-12-29 MED ORDER — ACETAMINOPHEN 325 MG PO TABS: 650.0000 mg | ORAL_TABLET | Freq: Four times a day (QID) | ORAL | Status: DC | PRN

## 2020-12-29 MED ORDER — SERTRALINE HCL 100 MG PO TABS
200.0000 mg | ORAL_TABLET | Freq: Every day | ORAL | Status: DC
  Administered 2020-12-30: 200 mg via ORAL
  Filled 2020-12-29: qty 2

## 2020-12-29 MED ORDER — OXYMETAZOLINE HCL 0.05 % NA SOLN
NASAL | Status: AC
  Filled 2020-12-29: qty 30

## 2020-12-29 MED ORDER — MIDAZOLAM HCL 2 MG/2ML IJ SOLN
INTRAMUSCULAR | Status: AC
  Filled 2020-12-29: qty 2

## 2020-12-29 MED ORDER — PROPOFOL 10 MG/ML IV BOLUS
INTRAVENOUS | Status: DC | PRN
  Administered 2020-12-29: 120 mg via INTRAVENOUS

## 2020-12-29 MED ORDER — CHLORHEXIDINE GLUCONATE CLOTH 2 % EX PADS: 6.0000 | MEDICATED_PAD | Freq: Once | CUTANEOUS | Status: DC

## 2020-12-29 MED ORDER — OXYCODONE HCL 5 MG/5ML PO SOLN
5.0000 mg | Freq: Once | ORAL | Status: AC | PRN
  Administered 2020-12-29: 5 mg via ORAL

## 2020-12-29 MED ORDER — OXYCODONE HCL 5 MG/5ML PO SOLN
ORAL | Status: AC
  Filled 2020-12-29: qty 5

## 2020-12-29 MED ORDER — FENTANYL CITRATE (PF) 100 MCG/2ML IJ SOLN
25.0000 ug | INTRAMUSCULAR | Status: DC | PRN
  Administered 2020-12-29 (×3): 50 ug via INTRAVENOUS

## 2020-12-29 MED ORDER — OXYCODONE HCL 5 MG PO TABS: 5.0000 mg | ORAL_TABLET | Freq: Once | ORAL | Status: AC | PRN

## 2020-12-29 MED ORDER — DEXTROSE 50 % IV SOLN
INTRAVENOUS | Status: AC
  Filled 2020-12-29: qty 50

## 2020-12-29 MED ORDER — ROCURONIUM BROMIDE 10 MG/ML (PF) SYRINGE
PREFILLED_SYRINGE | INTRAVENOUS | Status: DC | PRN
  Administered 2020-12-29: 50 mg via INTRAVENOUS

## 2020-12-29 MED ORDER — FLUCONAZOLE 200 MG PO TABS
400.0000 mg | ORAL_TABLET | Freq: Every day | ORAL | Status: DC
  Administered 2020-12-30: 400 mg via ORAL
  Filled 2020-12-29: qty 2

## 2020-12-29 MED ORDER — ERTAPENEM IV (FOR PTA / DISCHARGE USE ONLY): 1.0000 g | INTRAVENOUS | Status: DC

## 2020-12-29 MED ORDER — FENTANYL CITRATE (PF) 250 MCG/5ML IJ SOLN
INTRAMUSCULAR | Status: AC
  Filled 2020-12-29: qty 5

## 2020-12-29 MED ORDER — LIDOCAINE-EPINEPHRINE 2 %-1:100000 IJ SOLN
INTRAMUSCULAR | Status: AC
  Filled 2020-12-29: qty 1

## 2020-12-29 MED ORDER — DEXAMETHASONE SODIUM PHOSPHATE 10 MG/ML IJ SOLN
INTRAMUSCULAR | Status: DC | PRN
  Administered 2020-12-29: 4 mg via INTRAVENOUS

## 2020-12-29 MED ORDER — MESALAMINE 1.2 G PO TBEC
2.4000 g | DELAYED_RELEASE_TABLET | Freq: Every day | ORAL | Status: DC
  Administered 2020-12-30: 2.4 g via ORAL
  Filled 2020-12-29: qty 2

## 2020-12-29 MED ORDER — FENTANYL CITRATE (PF) 100 MCG/2ML IJ SOLN
INTRAMUSCULAR | Status: AC
  Filled 2020-12-29: qty 2

## 2020-12-29 MED ORDER — LIDOCAINE 2% (20 MG/ML) 5 ML SYRINGE
INTRAMUSCULAR | Status: AC
  Filled 2020-12-29: qty 5

## 2020-12-29 MED ORDER — LACTATED RINGERS IV SOLN: INTRAVENOUS | Status: DC

## 2020-12-29 MED ORDER — LACTATED RINGERS IV SOLN: INTRAVENOUS | Status: DC | PRN

## 2020-12-29 MED ORDER — ONDANSETRON HCL 4 MG/2ML IJ SOLN
INTRAMUSCULAR | Status: AC
  Filled 2020-12-29: qty 2

## 2020-12-29 MED ORDER — MIDAZOLAM HCL 5 MG/5ML IJ SOLN
INTRAMUSCULAR | Status: DC | PRN
  Administered 2020-12-29: 2 mg via INTRAVENOUS

## 2020-12-29 MED ORDER — CHLORHEXIDINE GLUCONATE 0.12 % MT SOLN
OROMUCOSAL | Status: AC
  Administered 2020-12-29: 15 mL via OROMUCOSAL
  Filled 2020-12-29: qty 15

## 2020-12-29 MED ORDER — ORAL CARE MOUTH RINSE: 15.0000 mL | Freq: Once | OROMUCOSAL | Status: AC

## 2020-12-29 MED ORDER — HYDROCODONE-ACETAMINOPHEN 5-325 MG PO TABS
1.0000 | ORAL_TABLET | ORAL | Status: DC | PRN
  Administered 2020-12-30: 1 via ORAL
  Filled 2020-12-29: qty 1

## 2020-12-29 MED ORDER — ONDANSETRON HCL 4 MG/2ML IJ SOLN: 4.0000 mg | Freq: Once | INTRAMUSCULAR | Status: DC | PRN

## 2020-12-29 MED ORDER — RIFAXIMIN 550 MG PO TABS
550.0000 mg | ORAL_TABLET | Freq: Two times a day (BID) | ORAL | Status: DC
  Administered 2020-12-30 (×2): 550 mg via ORAL
  Filled 2020-12-29 (×3): qty 1

## 2020-12-29 MED ORDER — CHLORHEXIDINE GLUCONATE 0.12 % MT SOLN: 15.0000 mL | Freq: Once | OROMUCOSAL | Status: AC

## 2020-12-29 MED ORDER — SUGAMMADEX SODIUM 500 MG/5ML IV SOLN
INTRAVENOUS | Status: AC
  Filled 2020-12-29: qty 5

## 2020-12-29 MED ORDER — LIDOCAINE-EPINEPHRINE 2 %-1:100000 IJ SOLN
INTRAMUSCULAR | Status: DC | PRN
  Administered 2020-12-29: 6 mL via INTRADERMAL

## 2020-12-29 MED ORDER — GABAPENTIN 300 MG PO CAPS
300.0000 mg | ORAL_CAPSULE | Freq: Every day | ORAL | Status: DC
  Administered 2020-12-30: 300 mg via ORAL
  Filled 2020-12-29: qty 1

## 2020-12-29 MED ORDER — INSULIN ASPART 100 UNIT/ML IJ SOLN: 0.0000 [IU] | Freq: Three times a day (TID) | INTRAMUSCULAR | Status: DC

## 2020-12-29 MED ORDER — LATANOPROST 0.005 % OP SOLN
1.0000 [drp] | Freq: Every day | OPHTHALMIC | Status: DC
  Administered 2020-12-30: 1 [drp] via OPHTHALMIC
  Filled 2020-12-29: qty 2.5

## 2020-12-29 MED ORDER — DEXAMETHASONE SODIUM PHOSPHATE 10 MG/ML IJ SOLN
INTRAMUSCULAR | Status: AC
  Filled 2020-12-29: qty 1

## 2020-12-29 MED ORDER — LIDOCAINE 2% (20 MG/ML) 5 ML SYRINGE
INTRAMUSCULAR | Status: DC | PRN
  Administered 2020-12-29: 80 mg via INTRAVENOUS

## 2020-12-29 MED ORDER — AMISULPRIDE (ANTIEMETIC) 5 MG/2ML IV SOLN: 10.0000 mg | Freq: Once | INTRAVENOUS | Status: DC | PRN

## 2020-12-29 MED ORDER — 0.9 % SODIUM CHLORIDE (POUR BTL) OPTIME
TOPICAL | Status: DC | PRN
  Administered 2020-12-29: 1000 mL

## 2020-12-29 MED ORDER — SODIUM CHLORIDE 0.9 % IV SOLN
1.0000 g | INTRAVENOUS | Status: DC
  Administered 2020-12-30: 1000 mg via INTRAVENOUS
  Filled 2020-12-29: qty 1

## 2020-12-29 SURGICAL SUPPLY — 36 items
BAG COUNTER SPONGE SURGICOUNT (BAG) IMPLANT
BAG SPNG CNTER NS LX DISP (BAG)
BLADE SURG 15 STRL LF DISP TIS (BLADE) ×1 IMPLANT
BLADE SURG 15 STRL SS (BLADE) ×2
BUR CROSS CUT FISSURE 1.6 (BURR) ×2 IMPLANT
CANISTER SUCT 3000ML PPV (MISCELLANEOUS) ×2 IMPLANT
COVER SURGICAL LIGHT HANDLE (MISCELLANEOUS) ×2 IMPLANT
DECANTER SPIKE VIAL GLASS SM (MISCELLANEOUS) ×2 IMPLANT
DRAPE U-SHAPE 76X120 STRL (DRAPES) ×2 IMPLANT
GAUZE PACKING FOLDED 2  STR (GAUZE/BANDAGES/DRESSINGS) ×2
GAUZE PACKING FOLDED 2 STR (GAUZE/BANDAGES/DRESSINGS) ×1 IMPLANT
GAUZE SPONGE 4X4 12PLY STRL LF (GAUZE/BANDAGES/DRESSINGS) ×2 IMPLANT
GLOVE SRG 8 PF TXTR STRL LF DI (GLOVE) IMPLANT
GLOVE SURG ENC MOIS LTX SZ8 (GLOVE) ×2 IMPLANT
GLOVE SURG UNDER POLY LF SZ8 (GLOVE)
GOWN STRL REUS W/ TWL LRG LVL3 (GOWN DISPOSABLE) ×1 IMPLANT
GOWN STRL REUS W/ TWL XL LVL3 (GOWN DISPOSABLE) ×1 IMPLANT
GOWN STRL REUS W/TWL LRG LVL3 (GOWN DISPOSABLE) ×2
GOWN STRL REUS W/TWL XL LVL3 (GOWN DISPOSABLE) ×2
IV NS 1000ML (IV SOLUTION) ×2
IV NS 1000ML BAXH (IV SOLUTION) ×1 IMPLANT
KIT BASIN OR (CUSTOM PROCEDURE TRAY) ×2 IMPLANT
KIT TURNOVER KIT B (KITS) ×2 IMPLANT
NDL HYPO 25GX1X1/2 BEV (NEEDLE) ×2 IMPLANT
NEEDLE HYPO 25GX1X1/2 BEV (NEEDLE) ×4 IMPLANT
NS IRRIG 1000ML POUR BTL (IV SOLUTION) ×2 IMPLANT
PAD ARMBOARD 7.5X6 YLW CONV (MISCELLANEOUS) ×2 IMPLANT
SLEEVE IRRIGATION ELITE 7 (MISCELLANEOUS) ×2 IMPLANT
SPONGE SURGIFOAM ABS GEL 12-7 (HEMOSTASIS) IMPLANT
SUT CHROMIC 3 0 PS 2 (SUTURE) ×2 IMPLANT
SYR BULB IRRIG 60ML STRL (SYRINGE) ×2 IMPLANT
SYR CONTROL 10ML LL (SYRINGE) ×2 IMPLANT
TRAY ENT MC OR (CUSTOM PROCEDURE TRAY) ×2 IMPLANT
TUBE SALEM SUMP 16 FR W/ARV (TUBING) ×2 IMPLANT
TUBING IRRIGATION (MISCELLANEOUS) ×2 IMPLANT
YANKAUER SUCT BULB TIP NO VENT (SUCTIONS) ×2 IMPLANT

## 2020-12-29 NOTE — Progress Notes (Signed)
Hypoglycemic Event  CBG: 67  Treatment: D50 25 mL (12.5 gm)  Symptoms: None  Follow-up CBG: Time:1748 CBG Result:115  Possible Reasons for Event: Inadequate meal intake  Comments/MD notified:Dr. Rodolph Bong

## 2020-12-29 NOTE — Op Note (Signed)
PRE-OPERATIVE DIAGNOSIS:  Osteomyelitis of the mandible POST-OPERATIVE DIAGNOSIS:  Same    SURGEON:  Surgeon(s) and Role:    [name], DMD - Primary   Procedure: Surgical removal #31 ([id number] x 1) Debridement of mandible (33383)  Description of Operation/Procedure:   The patient was encountered in [facility] OR Room 3.  General anesthesia was induced and a nasal ETT was secured in the standard fashion.  The table was moved slightly away from anesthesia and the patient was properly padded, relieving all pressure points.  A formal time-out was executed.  2% lidocaine with 1:100,000 epinephrine was infiltrated into the proposed surgical sites.  The patient was prepped and draped in the standard sterile fashion and a throat pack was placed.                Attention was then directed intraorally to the mandible.  A full thickness mucoperiosteal flap was elevated on both the buccal and lingual.  Symptomatic tooth #31 was removed surgically without complication. The previous debridement site was reopened, immature granulation tissue was debrided out and additional mandibular debridement was accomplished to reach bleeding, healthy bone at all margins.  The anterior lingual mandibular osteomyelitic bone as seen on pre-op CBCT was accessed and debrided.  The IAN was visualized the right side and was involved with the disease process -- it was sacrificed in order to obtain adequate debridement. Bone was then smoothed in all areas which were thoroughly irrigated. The flap was then closed with 3-0 chromic gut suture in both interrupted and continuous fashion..   The oral cavity was suctioned free of all debris and secretions.  The throat pack was removed and an orogastric tube was passed to evacuate the stomach contents.  Sponge and needle counts were correct x 2.  Care of the patient was turned over to the Anesthesia team for uneventful extubation and delivery of the patient to the PACU in stable condition.    ANESTHESIA:   general EBL:  100 mL DRAINS: None LOCAL MEDICATIONS USED:  LIDOCAINE 2% w/ 1:100000 epi SPECIMEN:  None PLAN OF CARE: Overnight observation PATIENT DISPOSITION:  PACU - hemodynamically stable. Delay start of Pharmacological VTE agent (>24hrs) due to surgical blood loss or risk of bleeding: no   OMFS Recommendations -Hospitalist consult for overnight admission/observation -Full cup liquid diet -Peridex (chlorhexidine) mouthrnise QID -Continue Ertapenem/Fluconazole while inpatient -Follow up in clinic in 1-2 weeks

## 2020-12-29 NOTE — Anesthesia Preprocedure Evaluation (Addendum)
Anesthesia Evaluation  Patient identified by MRN, date of birth, ID band Patient awake    Reviewed: Allergy & Precautions, NPO status , Patient's Chart, lab work & pertinent test results  History of Anesthesia Complications (+) PONV and history of anesthetic complications  Airway Mallampati: III  TM Distance: >3 FB Neck ROM: Full    Dental  (+) Dental Advisory Given, Poor Dentition, Missing, Loose   Pulmonary sleep apnea and Continuous Positive Airway Pressure Ventilation ,    Pulmonary exam normal        Cardiovascular hypertension, Pt. on medications + DOE  Normal cardiovascular exam     Neuro/Psych  Headaches, Seizures - (roughly q/monthly),  PSYCHIATRIC DISORDERS Anxiety Depression    GI/Hepatic GERD  Medicated and Controlled,(+) Cirrhosis       , Hepatitis - (NASH) Crohn's disease    Endo/Other  diabetes, Well Controlled, Type 2  Renal/GU Renal diseaseLeft Renal Calculus     Musculoskeletal  (+) Arthritis , Mandibular Osteomyelitis   Abdominal   Peds  Hematology  (+) anemia , Thrombocytopenia   Anesthesia Other Findings   Reproductive/Obstetrics                          Anesthesia Physical  Anesthesia Plan  ASA: 3  Anesthesia Plan: General   Post-op Pain Management:    Induction: Intravenous  PONV Risk Score and Plan: 3 and Treatment may vary due to age or medical condition, Ondansetron, Dexamethasone and Midazolam  Airway Management Planned: Nasal ETT  Additional Equipment: None  Intra-op Plan:   Post-operative Plan: Extubation in OR  Informed Consent: I have reviewed the patients History and Physical, chart, labs and discussed the procedure including the risks, benefits and alternatives for the proposed anesthesia with the patient or authorized representative who has indicated his/her understanding and acceptance.     Dental advisory given  Plan Discussed  with: CRNA and Anesthesiologist  Anesthesia Plan Comments:       Anesthesia Quick Evaluation

## 2020-12-29 NOTE — Anesthesia Procedure Notes (Signed)
Procedure Name: Intubation Date/Time: 12/29/2020 3:15 PM Performed by: Geraldine Contras, CRNA Pre-anesthesia Checklist: Patient identified, Patient being monitored, Timeout performed, Emergency Drugs available and Suction available Patient Re-evaluated:Patient Re-evaluated prior to induction Oxygen Delivery Method: Circle system utilized Preoxygenation: Pre-oxygenation with 100% oxygen Induction Type: IV induction Ventilation: Mask ventilation without difficulty Laryngoscope Size: Mac, 3 and Glidescope Grade View: Grade I Nasal Tubes: Right, Nasal prep performed and Nasal Rae Tube size: 7.0 mm Number of attempts: 1 Placement Confirmation: ETT inserted through vocal cords under direct vision, positive ETCO2 and breath sounds checked- equal and bilateral Tube secured with: Tape Dental Injury: Teeth and Oropharynx as per pre-operative assessment

## 2020-12-29 NOTE — H&P (Addendum)
H&P  ID: The patient is a 10 yoM who presented with pain and swelling of the lower right jaw.  History of Present Illness:  Patient is a 77 yoM with confirmed mandibular osteomyelitis of unknown origin. He was previously admitted from 6/13-6/21 and was taken to the OR on 12/07/20 for removal of teeth #20-30 and debridement of mandible. ID was consulted, PICC line placed and patient has been on fluconazole/ertapenem for OPAT per ID. Since discharge he has been making slight improvements however continues to report mandibular pain including pain with tooth #31. Continues to have right V3 paresthesia. CBCT was taken in clinic that showed area of concern for residual osteomyelitis of anterior mandible.   Clinical Exam: Extraoral Exam:              Patient is alert, orientated and in mild distress             CN II-XII intact except right V3 paresthesia             Facial swelling/erythema has resolved   Intraoral Exam:              The patient does not have trismus with maximum incisal opening of 50 mm.             The floor of mouth is soft and non-elevated             There is not lateral pharyngeal swelling with the uvula midline             There is no palatal draping present.             Oral airway is patent   Incisions are mostly closed with slight erythema of the mandibular gingiva and dehiscence of the premolar area bilaterally. No purulence can be expressed from these areas. No areas of exposed bone.  #31 is very tender to percussion, minimal mobility     Radiographic Exam:               Outside CBCT shows residual area of osteomyelitis of the anterior mandible along the lingual aspect of site #23-25 in area of genial tubercles.    Assessment: 66 yoM with mandibular osteomyelitis with slightly improving clinical course however has residual area of necrotic bone in anterior mandible.   Plan:  - Plan for OR 12/29/20 for additional mandibular debridement and extraction of teeth as  necessary including #31 - Plan for overnight observation and discharge tomorrow - Continue ertapenem/fluconazole; will take additional cultures depending on clinical findings  Terese Door, DDS Oral and Maxillofacial Surgeon [facility] Office # [phone] Cell # [phone]

## 2020-12-29 NOTE — H&P (Signed)
History and Physical    PLEASE NOTE THAT DRAGON DICTATION SOFTWARE WAS USED IN THE CONSTRUCTION OF THIS NOTE.   [name] [name] WGY:[id number] DOB: 03/01/1961 DOA: 12/29/2020  PCP: [name], MD Patient coming from: home   I have personally briefly reviewed patient's old medical records in [place]  Chief Complaint: Lower right jaw pain  HPI: [name] [name] is a 60 y.o. male with medical history significant for mandibular osteomyelitis status post recent debridement, type 2 diabetes mellitus, complex partial seizures, Crohn's disease, [name] cirrhosis complicated by chronic pancytopenia, who is admitted to [facility] on 12/29/2020 with mandibular osteomyelitis after presenting from home complaining of right mandibular pain.   The patient has a history of right mandibular osteomyelitis resulting in hospitalization at [facility] from 12/05/2020 to 12/11/2020, during which he was taken to the OR on 12/07/2020 for mandibular debridement by oral surgery.  During this previous hospitalization, infectious disease was consulted, with ensuing recommendation for a 6-week course of ertapenem, with final dose noted scheduled for 01/18/2021.  Patient had a PICC line placed during that hospitalization, has been compliantly receiving his daily ertapenem in the interval.  However, in spite of this interval antibiotic compliance, the patient has been experiencing interval progressive right mandibular pain and swelling, prompting plain films of the right mandible performed at oral surgery clinic, which demonstrated residual osteomyelitis involving the anterior mandible.  Consequently, he was taken back to the OR by Dr. Conley Simmonds of oral surgery earlier today, at which time he underwent further debridement of the right mandible under general anesthesia.  He subsequently recovered in the PACU, and no immediate perioperative complications were reported.  Postoperatively, while patient still in the PACU,  Dr. Conley Simmonds contacted the hospitalist service requesting overnight observation to the hospitalist service for further evaluation and management of the patient's presenting mandibular osteomyelitis.  Postoperatively, the patient reports adequate pain control as relates to the right mandible. Denies any associated subjective fever, chills, rigors, or generalized myalgias. Denies any recent headache, neck stiffness, rhinitis, rhinorrhea, sore throat, sob, wheezing, cough, nausea, vomiting, abdominal pain, diarrhea, or rash. No recent traveling or known COVID-19 exposures. Denies dysuria, gross hematuria, or change in urinary urgency/frequency.  Denies any recent chest pain, sob, diaphoresis, or palpitations.  Medical history notable for history of [name] cirrhosis for which she is on chronic rifaximin therapy.  His cirrhosis is complicated by a documented history of chronic pancytopenia.  Medical history also notable for type 2 diabetes mellitus that the patient conveys is managed via lifestyle modifications in the absence of any oral hypoglycemic agents or insulin as an outpatient.  This confirms a history of seizure disorder with documentation of prior complex partial seizures, most recently occurring a few years ago.  He reports good compliance with home lamotrigine.   Preoperative labs drawn this afternoon were notable for the following: BMP notable for sodium 139, bicarbonate 22, creatinine 0.80 relative to most recent prior value of 0.78 on 12/14/2020, glucose 74.  CBC notable for white blood cell count of 2300 relative to 3200 on 12/10/2020, hemoglobin 10.1 relative to 9.1 on 12/11/2020, with today's hemoglobin value associated with macrocytic/normochromic findings as well as nonelevated RDW, platelet count 55 relative to 99 on 12/07/2020.  Of note, he underwent screening nasopharyngeal COVID-19 PCR yesterday, which was found to be negative.     Review of Systems: As per HPI otherwise 10 point review of  systems negative.   Past Medical History:  Diagnosis Date  Adenomatous colon polyp 10/1999   Anxiety    Arthritis    neck, yoga helps.   Cataract    Complex partial seizure ([place])    last seizure was 08-30-2018   Crohn's disease of small and large intestines ([place]) 1999   Depression    Diabetes mellitus    no meds at this time 10-24-17   Esophageal stricture    External hemorrhoids    GERD (gastroesophageal reflux disease)    Glaucoma    History of kidney stones    3 large stones still present   Hyperlipemia    Hypertension    resolved with weight loss    Inguinal hernia, bilateral 12/2019   Liver cirrhosis secondary to NASH ([place])    Migraines    Osteoporosis    PONV (postoperative nausea and vomiting)    PTSD (post-traumatic stress disorder)    Renal cyst, acquired, left 07/08/2017   Skin cancer    squamous cell multiple; [place]; followed every 3 months.   Sleep apnea    CPAP machine, uses nightly   Staphylococcus aureus bacteremia with sepsis ([place]) 05/28/2017   MRSA 2012    Past Surgical History:  Procedure Laterality Date   CATARACT EXTRACTION Bilateral    COLONOSCOPY     DEBRIDEMENT MANDIBLE N/A 12/07/2020   Procedure: DEBRIDEMENT OF MANDIBLE;  Surgeon: [name], DMD;  Location: [facility];  Service: Oral Surgery;  Laterality: N/A;   ELBOW SURGERY  2012   elbow MRSA infection    EYE SURGERY     cataracts removed, /w IOL   INCISION AND DRAINAGE ABSCESS N/A 12/07/2020   Procedure: INCISION AND DRAINAGE MANDIBULAR ABSCESS;  Surgeon: [name], DMD;  Location: [facility];  Service: Oral Surgery;  Laterality: N/A;   INGUINAL HERNIA REPAIR Right 01/14/2020   Procedure: OPEN RIGHT HERNIA REPAIR INGUINAL WITH MESH;  Surgeon: [name], MD;  Location: [facility];  Service: General;  Laterality: Right;   INSERTION OF MESH N/A 07/01/2016   Procedure: INSERTION OF MESH;  Surgeon: [name], MD;  Location: [facility];  Service: General;  Laterality: N/A;   IR  URETERAL STENT LEFT NEW ACCESS W/O SEP NEPHROSTOMY CATH  02/02/2018   NEPHROLITHOTOMY Left 02/02/2018   Procedure: NEPHROLITHOTOMY PERCUTANEOUS;  Surgeon: [name], MD;  Location: [facility];  Service: Urology;  Laterality: Left;   POLYPECTOMY     SCALP LACERATION REPAIR Right 10/16/2017   From fall/staples   SHOULDER ARTHROSCOPY Right 03/05/2018   Procedure: ARTHROSCOPY SHOULDER AND OPEN DISTAL CLAVICLE EXCISION;  Surgeon: [name], MD;  Location: [facility];  Service: Orthopedics;  Laterality: Right;   SHOULDER SURGERY     SINUS SURGERY WITH INSTATRAK     TEE WITHOUT CARDIOVERSION N/A 05/09/2017   Procedure: TRANSESOPHAGEAL ECHOCARDIOGRAM (TEE);  Surgeon: [name], MD;  [facility];  Service: Cardiovascular;  Laterality: N/A;   TOOTH EXTRACTION N/A 12/07/2020   Procedure: DENTAL EXTRACTIONS OF TEETH TWENTY TO THIRTY;  Surgeon: [name], DMD;  Location: [facility];  Service: Oral Surgery;  Laterality: N/A;   TRANSTHORACIC ECHOCARDIOGRAM  02/22/2011   EF 55-65%; increased pattern of LVH with mild conc hypertrophy, abnormal relaxation & increased filling pressure (grade 2 diastolic dysfunction); atrial septum thickened (lipomatous hypertrophy)   UMBILICAL HERNIA REPAIR N/A 07/01/2016   Procedure: UMBILICAL HERNIA REPAIR WITH MESH;  Surgeon: [name], MD;  Location: [facility];  Service: General;  Laterality: N/A;   UPPER GASTROINTESTINAL ENDOSCOPY     VASECTOMY  Social History:  reports that he has never smoked. He has never used smokeless tobacco. He reports that he does not drink alcohol and does not use drugs.   Allergies  Allergen Reactions   Azithromycin Swelling   Beef-Derived Products Diarrhea    RED MEAT > UNSPECIFIED REACTION    Milk-Related Compounds Diarrhea    DAIRY >UNSPECIFIED REACTION    Penicillins Hives    Rash as child- tolerates Ancef/CHILDHOOD ALLERGY Has patient had a PCN reaction causing immediate rash, facial/tongue/throat swelling, SOB  or lightheadedness with hypotension: YES Has patient had a PCN reaction causing severe rash involving mucus membranes or skin necrosis: Unknown Has patient had a PCN reaction that required hospitalization: Unknown Has patient had a PCN reaction occurring within the last 10 years:Unknown If all of the above answers are "NO", then may proceed with Cephalosporin [place]   Sulfonamide Derivatives Hives   Dilaudid [Hydromorphone Hcl] Nausea And Vomiting    Family History  Problem Relation Age of Onset   Colon polyps Father    Stroke Father    Hyperlipidemia Father    Hypertension Father    Heart disease Mother        CABG at age 46   Hyperlipidemia Mother    Hypertension Mother    Stroke Paternal Grandmother    Stroke Paternal Grandfather    Other Child        tetrology of fallot (cornealia deland syndrome)   Colon cancer Neg Hx    Esophageal cancer Neg Hx    Stomach cancer Neg Hx    Rectal cancer Neg Hx     Family history reviewed and not pertinent    Prior to Admission medications   Medication Sig Start Date End Date Taking? Authorizing Provider  B Complex Vitamins (VITAMIN B COMPLEX) TABS Take 1 tablet by mouth every evening.   Yes [provider]  Calcium Carbonate-Vitamin D (CALCIUM 600+D PO) Take 1 tablet by mouth 2 (two) times daily.   Yes [provider]  cholecalciferol (VITAMIN D) 1000 units tablet Take 1,000 Units at bedtime by mouth.    Yes [provider]  ertapenem (INVANZ) IVPB Inject 1 g into the vein daily. Indication:  Mandibular osteomyelitis  First Dose: Yes Last Day of Therapy:  01/18/21 Labs - Once weekly:  CBC/D and BMP, Labs - Every other week:  ESR and CRP Method of administration: Mini-Bag Plus / Gravity Method of administration may be changed at the discretion of home infusion pharmacist based upon assessment of the patient and/or caregiver's ability to self-administer the medication ordered. 12/08/20 01/18/21 Yes Comer, Okey Regal, MD   esomeprazole (NEXIUM) 40 MG capsule TAKE 1 CAPSULE TWICE DAILY Patient taking differently: Take 40 mg by mouth 2 (two) times daily before a meal. 10/02/20  Yes Ladene Artist, MD  Eszopiclone 3 MG TABS Take 3 mg by mouth at bedtime. Take immediately before bedtime   Yes [provider]  feeding supplement (ENSURE ENLIVE / ENSURE PLUS) LIQD Take 237 mLs by mouth 2 (two) times daily between meals. 12/11/20  Yes Cristal Deer, MD  fluconazole (DIFLUCAN) 200 MG tablet Take 2 tablets (400 mg total) by mouth daily. 12/12/20  Yes Comer, Okey Regal, MD  folic acid (FOLVITE) 1 MG tablet Take 2 tablets (2 mg total) by mouth daily. 01/11/20  Yes Jacelyn Pi, Lilia Argue, MD  gabapentin (NEURONTIN) 300 MG capsule Take 1 capsule (300 mg total) by mouth daily. 12/11/20  Yes Cristal Deer, MD  HYDROcodone-acetaminophen (  NORCO/VICODIN) 5-325 MG tablet Take 1 tablet by mouth every 4 (four) hours as needed for moderate pain.   Yes [provider]  Lactase (DAIRY RELIEF PO) Take 2 tablets by mouth in the morning and at bedtime.   Yes [provider]  lamoTRIgine (LAMICTAL) 150 MG tablet Take 1 tablet (150 mg total) by mouth 2 (two) times daily. 11/09/20  Yes Lomax, Amy, NP  latanoprost (XALATAN) 0.005 % ophthalmic solution Place 1 drop into both eyes at bedtime.    Yes [provider]  mercaptopurine (PURINETHOL) 50 MG tablet TAKE 2 TABLETS DAILY ON AN EMPTY STOMACH 1 HOUR BEFOREOR 2 HOURS AFTER A MEAL.   CAUTION: CHEMOTHERAPY 10/02/20  Yes Ladene Artist, MD  mesalamine (LIALDA) 1.2 g EC tablet TAKE 2 TABLETS DAILY WITH  BREAKFAST Patient taking differently: Take 2.4 g by mouth daily with breakfast. 10/02/20  Yes Ladene Artist, MD  methscopolamine (PAMINE FORTE) 5 MG tablet TAKE 1 TABLET TWICE A DAY Patient taking differently: Take 5 mg by mouth 2 (two) times daily. 10/02/20  Yes Ladene Artist, MD  mirabegron ER (MYRBETRIQ) 25 MG TB24 tablet Take 25 mg by mouth at bedtime.   Yes  [provider]  Multiple Vitamin (MULTIVITAMIN WITH MINERALS) TABS tablet Take 1 tablet by mouth daily. 12/12/20  Yes Cristal Deer, MD  niacinamide 100 MG tablet Take 100 mg by mouth 2 (two) times daily.   Yes [provider]  ondansetron (ZOFRAN) 4 MG tablet Take 1 tablet (4 mg total) by mouth every 6 (six) hours as needed for nausea. 12/11/20  Yes Cristal Deer, MD  potassium chloride SA (KLOR-CON) 20 MEQ tablet Take 1 tablet (20 mEq total) by mouth 2 (two) times daily. 01/11/20  Yes Jacelyn Pi, Lilia Argue, MD  Probiotic Product (PROBIOTIC PO) Take 1 tablet at bedtime by mouth.    Yes [provider]  sertraline (ZOLOFT) 100 MG tablet Take 200 mg daily with breakfast by mouth.  03/16/17  Yes [provider]  tamsulosin (FLOMAX) 0.4 MG CAPS capsule Take 0.4 mg by mouth at bedtime as needed (blood in urine). 11/17/20  Yes [provider]  traZODone (DESYREL) 150 MG tablet Take 150 mg by mouth at bedtime.   Yes [provider]  vitamin C (ASCORBIC ACID) 500 MG tablet Take 500 mg by mouth daily.   Yes [provider]  XIFAXAN 550 MG TABS tablet TAKE 1 TABLET TWICE A DAY Patient taking differently: Take 550 mg by mouth 2 (two) times daily. 10/02/20  Yes Ladene Artist, MD  chlorhexidine (PERIDEX) 0.12 % solution Use as directed 15 mLs in the mouth or throat 4 (four) times daily. Patient not taking: No sig reported 12/11/20   Cristal Deer, MD  glucose blood (CHOICE DM FORA G20 TEST STRIPS) test strip Use as instructed 11/13/12   [name], MD  lidocaine (XYLOCAINE) 2 % solution Use as directed 15 mLs in the mouth or throat as needed for mouth pain. Patient not taking: No sig reported 11/23/20   Arnaldo Natal, MD  ONE [facility] ULTRA TEST test strip USE AS INSTRUCTED    Mancel Bale, PA-C     Objective    Physical Exam: Vitals:   12/29/20 1723 12/29/20 1740 12/29/20 1755 12/29/20 1810  BP: 124/75 127/80 135/80 137/74   Pulse: 76 76 78 79  Resp: _0 Temp:      TempSrc:      SpO2: 97%  93% 99% 98%  Weight:      Height:        General: appears to be stated age; alert, oriented Skin: warm, dry, no rash Head:  AT/[place] Mouth:  Oral mucosa membranes appear moist, normal dentition Neck: supple; trachea midline Heart:  RRR; did not appreciate any M/R/G Lungs: CTAB, did not appreciate any wheezes, rales, or rhonchi Abdomen: + BS; soft, ND, NT Vascular: 2+ pedal pulses b/l; 2+ radial pulses b/l Extremities: no peripheral edema, no muscle wasting Neuro: strength and sensation intact in upper and lower extremities b/l     Labs on Admission: I have personally reviewed following labs and imaging studies  CBC: Recent Labs  Lab 12/29/20 1436  WBC 2.3*  NEUTROABS 1.4*  HGB 10.1*  HCT 29.2*  MCV 107.7*  PLT 55*   Basic Metabolic Panel: Recent Labs  Lab 12/29/20 1340  NA 139  K 3.8  CL 107  CO2 22  GLUCOSE 74  BUN 10  CREATININE 0.80  CALCIUM 9.1   GFR: Estimated Creatinine Clearance: 105.9 mL/min (by C-G formula based on SCr of 0.8 mg/dL). Liver Function Tests: No results for input(s): AST, ALT, ALKPHOS, BILITOT, PROT, ALBUMIN in the last 168 hours. No results for input(s): LIPASE, AMYLASE in the last 168 hours. No results for input(s): AMMONIA in the last 168 hours. Coagulation Profile: No results for input(s): INR, PROTIME in the last 168 hours. Cardiac Enzymes: No results for input(s): CKTOTAL, CKMB, CKMBINDEX, TROPONINI in the last 168 hours. BNP (last 3 results) No results for input(s): PROBNP in the last 8760 hours. HbA1C: No results for input(s): HGBA1C in the last 72 hours. CBG: Recent Labs  Lab 12/29/20 1254 12/29/20 1656 12/29/20 1746  GLUCAP 79 67* 115*   Lipid Profile: No results for input(s): CHOL, HDL, LDLCALC, TRIG, CHOLHDL, LDLDIRECT in the last 72 hours. Thyroid Function Tests: No results for input(s): TSH, T4TOTAL, FREET4, T3FREE, THYROIDAB in the  last 72 hours. Anemia Panel: No results for input(s): VITAMINB12, FOLATE, FERRITIN, TIBC, IRON, RETICCTPCT in the last 72 hours. Urine analysis:    Component Value Date/Time   COLORURINE YELLOW 12/04/2020 2018   APPEARANCEUR CLEAR 12/04/2020 2018   APPEARANCEUR Clear 01/12/2020 0906   LABSPEC 1.018 12/04/2020 2018   PHURINE 7.5 12/04/2020 2018   GLUCOSEU NEGATIVE 12/04/2020 2018   HGBUR LARGE (A) 12/04/2020 2018   BILIRUBINUR NEGATIVE 12/04/2020 2018   BILIRUBINUR Negative 01/12/2020 0906   KETONESUR NEGATIVE 12/04/2020 2018   PROTEINUR 30 (A) 12/04/2020 2018   UROBILINOGEN 4.0 (H) 12/02/2019 1805   NITRITE NEGATIVE 12/04/2020 2018   LEUKOCYTESUR NEGATIVE 12/04/2020 2018    Radiological Exams on Admission: No results found.    Assessment/Plan   TRAMOND SLINKER is a 60 y.o. male with medical history significant for mandibular osteomyelitis status post recent debridement, type 2 diabetes mellitus, complex partial seizures, Crohn's disease, [name] cirrhosis complicated by chronic pancytopenia, who is admitted to [facility] on 12/29/2020 with mandibular osteomyelitis after presenting from home complaining of right mandibular pain.    Principal Problem:   Osteomyelitis of jaw Active Problems:   CROHN'S DISEASE, LARGE AND SMALL INTESTINES   DM2 (diabetes mellitus, type 2) (HCC)   Complex partial seizure (HCC)   Depression   Liver cirrhosis secondary to NASH (HCC)   GERD (gastroesophageal reflux disease)   Pancytopenia (HCC)      #) Osteomyelitis of right mandible: Status post debridement on 12/07/2020 followed by additional debridement earlier today in the setting of  interval right mandibular pain/swelling prompting outpatient plain films that demonstrated evidence of residual right mandibular osteomyelitis.  Postoperatively, Dr. Conley Simmonds of oral surgery requested admission to the hospitalist service from the PACU for overnight observation for further evaluation and  management right mandibular osteomyelitis.  Of note, infectious disease consultation during most recent previous hospitalization prompted ID recommendation for 6 weeks of ertapenem, prompting PICC line placement during previous hospitalization, with associated final dose of ertapenem noted to be scheduled for 01/18/2021.  Does not appear septic at this time.  Per Dr. Conley Simmonds, patient can have a full liquid diet, effective immediately, and recommends follow-up in oral surgery clinic in 1 to 2 weeks. Dr. Conley Simmonds conveys that he is available on a prn basis if subsequently needed by the hospitalist service during the remainder of the patient's hospitalization.  Plan: Continue daily ertapenem, per recommendation of prior infectious disease consult, with final dose of ertapenem scheduled for 01/10/2021.  Of note, the patient has already received his daily dose of ertapenem earlier today.  Repeat CBC with differential in the morning.  Continue outpatient as needed Norco.  Full liquid diet per oral surgery.  Follow-up in oral surgery clinic with Dr. Conley Simmonds in 1 to 2 weeks following discharge from the hospital.  Peridex mouth rinse 4 times daily.      #) Type 2 diabetes mellitus: Documented history of such, which the patient reports is managed via lifestyle modifications, in the absence of any oral hypoglycemic agents or insulin use as an outpatient.  Most recent glucose noted to be 74 per preoperative labs.  Plan: Accu-Cheks before every meal and at bedtime with low-dose ion scale insulin.       #) Depression: On Zoloft as an outpatient.  Plan: Continue home Zoloft.      #) Seizure disorder: Documented history of complex partial seizures, with most recent seizure occurring a few years ago.  The patient reports good compliance with lamotrigine as his outpatient antiepileptic.  No evidence of active seizures at this time.  Plan: Continue home lamotrigine.       #) Crohn's disease: On  mesalamine as an outpatient.  Plan: Continue home mesalamine.      #) GERD: On Nexium as an outpatient.  Plan: Continue home Nexium.      #) NASH cirrhosis: Documented history of such.  On chronic rifaximin therapy.  We will check CMP as well as INR in order to calculate corresponding meld score.  Of note, the patient is not on any Lasix or spironolactone as an outpatient.  His [name] cirrhosis is complicated by chronic pancytopenia, as further detailed below.  Denies any routine or recent alcohol consumption.  Plan: Check CMP and INR.  Monitor strict I's and O's and daily weights.  Continue home rifaximin.  Further evaluation management of associated chronic pancytopenia, as further detailed below.  SCDs.      #) Chronic pancytopenia: Documented history of such, as a complication of underlying [name] cirrhosis: Preoperative CBC obtained earlier today consistent with pancytopenia on par with previous associated/corresponding values, as further quantified above.  Plan: Further evaluation management of known underlying [name] cirrhosis, as above.  Check CMP and INR, as above.  Repeat CBC with differential in the morning.  Refraining from pharmacologic DVT prophylaxis.  SCDs.      DVT prophylaxis: SCDs Code Status: Full code Family Communication: none Disposition Plan: Per Rounding Team Consults called: none  Admission status: Observation; MedSurg     Of note, this patient was added  by me to the following Admit List/Treatment Team: [email or web address].      PLEASE NOTE THAT DRAGON DICTATION SOFTWARE WAS USED IN THE CONSTRUCTION OF THIS NOTE.   [facility] Pager [phone] From [place]  Otherwise, please contact night-coverage  [email or web address] Password [facility]   12/29/2020, 6:27 PM

## 2020-12-29 NOTE — Anesthesia Postprocedure Evaluation (Signed)
Anesthesia Post Note  Patient: [name] [name]  Procedure(s) Performed: DENTAL RESTORATION/EXTRACTIONS MANDIBULAR  DEBRIDEMENT     Patient location during evaluation: PACU Anesthesia Type: General Level of consciousness: awake and alert Pain management: pain level controlled Vital Signs Assessment: post-procedure vital signs reviewed and stable Respiratory status: spontaneous breathing, nonlabored ventilation, respiratory function stable and patient connected to nasal cannula oxygen Cardiovascular status: blood pressure returned to baseline and stable Postop Assessment: no apparent nausea or vomiting Anesthetic complications: no   No notable events documented.  Last Vitals:  Vitals:   12/29/20 1710 12/29/20 1723  BP: 137/77 124/75  Pulse: 80 76  Resp: 16 13  Temp:    SpO2: 95% 97%    Last Pain:  Vitals:   12/29/20 1723  TempSrc:   PainSc: 7                  Tsutomu Barfoot,W. EDMOND

## 2020-12-29 NOTE — Transfer of Care (Signed)
Immediate Anesthesia Transfer of Care Note  Patient: [name] [name]  Procedure(s) Performed: DENTAL RESTORATION/EXTRACTIONS MANDIBULAR  DEBRIDEMENT  Patient Location: PACU  Anesthesia Type:General  Level of Consciousness: awake  Airway & Oxygen Therapy: Patient Spontanous Breathing  Post-op Assessment: Report given to RN  Post vital signs: stable  Last Vitals:  Vitals Value Taken Time  BP 147/70 12/29/20 1653  Temp    Pulse 84 12/29/20 1653  Resp 13 12/29/20 1653  SpO2 99 % 12/29/20 1653  Vitals shown include unvalidated device data.  Last Pain:  Vitals:   12/29/20 1309  TempSrc:   PainSc: 4          Complications: No notable events documented.

## 2020-12-30 ENCOUNTER — Encounter (HOSPITAL_COMMUNITY): Payer: Self-pay | Admitting: Oral Surgery

## 2020-12-30 DIAGNOSIS — K7460 Unspecified cirrhosis of liver: Secondary | ICD-10-CM | POA: Diagnosis present

## 2020-12-30 DIAGNOSIS — M272 Inflammatory conditions of jaws: Secondary | ICD-10-CM

## 2020-12-30 DIAGNOSIS — K7469 Other cirrhosis of liver: Secondary | ICD-10-CM | POA: Diagnosis present

## 2020-12-30 DIAGNOSIS — D61818 Other pancytopenia: Secondary | ICD-10-CM | POA: Diagnosis present

## 2020-12-30 DIAGNOSIS — K7581 Nonalcoholic steatohepatitis (NASH): Secondary | ICD-10-CM | POA: Diagnosis present

## 2020-12-30 DIAGNOSIS — K219 Gastro-esophageal reflux disease without esophagitis: Secondary | ICD-10-CM | POA: Diagnosis present

## 2020-12-30 LAB — CBC WITH DIFFERENTIAL/PLATELET
Abs Immature Granulocytes: 0.01 10*3/uL (ref 0.00–0.07)
Basophils Absolute: 0 10*3/uL (ref 0.0–0.1)
Basophils Relative: 0 %
Eosinophils Absolute: 0 10*3/uL (ref 0.0–0.5)
Eosinophils Relative: 0 %
HCT: 30.5 % — ABNORMAL LOW (ref 39.0–52.0)
Hemoglobin: 10.4 g/dL — ABNORMAL LOW (ref 13.0–17.0)
Immature Granulocytes: 1 %
Lymphocytes Relative: 7 %
Lymphs Abs: 0.1 10*3/uL — ABNORMAL LOW (ref 0.7–4.0)
MCH: 36.9 pg — ABNORMAL HIGH (ref 26.0–34.0)
MCHC: 34.1 g/dL (ref 30.0–36.0)
MCV: 108.2 fL — ABNORMAL HIGH (ref 80.0–100.0)
Monocytes Absolute: 0.1 10*3/uL (ref 0.1–1.0)
Monocytes Relative: 4 %
Neutro Abs: 1.8 10*3/uL (ref 1.7–7.7)
Neutrophils Relative %: 88 %
Platelets: 51 10*3/uL — ABNORMAL LOW (ref 150–400)
RBC: 2.82 MIL/uL — ABNORMAL LOW (ref 4.22–5.81)
RDW: 13.8 % (ref 11.5–15.5)
WBC: 2 10*3/uL — ABNORMAL LOW (ref 4.0–10.5)
nRBC: 0 % (ref 0.0–0.2)

## 2020-12-30 LAB — BASIC METABOLIC PANEL
Anion gap: 6 (ref 5–15)
BUN: 12 mg/dL (ref 6–20)
CO2: 25 mmol/L (ref 22–32)
Calcium: 9.1 mg/dL (ref 8.9–10.3)
Chloride: 103 mmol/L (ref 98–111)
Creatinine, Ser: 0.96 mg/dL (ref 0.61–1.24)
GFR, Estimated: >60 mL/min (ref >60)
Glucose, Bld: 252 mg/dL — ABNORMAL HIGH (ref 70–99)
Potassium: 4.1 mmol/L (ref 3.5–5.1)
Sodium: 134 mmol/L — ABNORMAL LOW (ref 135–145)

## 2020-12-30 LAB — GLUCOSE, CAPILLARY
Glucose-Capillary: 125 mg/dL — ABNORMAL HIGH (ref 70–99)
Glucose-Capillary: 134 mg/dL — ABNORMAL HIGH (ref 70–99)

## 2020-12-30 LAB — PROTIME-INR
INR: 1.2 (ref 0.8–1.2)
Prothrombin Time: 15.6 seconds — ABNORMAL HIGH (ref 11.4–15.2)

## 2020-12-30 LAB — MAGNESIUM: Magnesium: 1.6 mg/dL — ABNORMAL LOW (ref 1.7–2.4)

## 2020-12-30 MED ORDER — OXYCODONE-ACETAMINOPHEN 5-325 MG PO TABS
1.0000 | ORAL_TABLET | Freq: Once | ORAL | Status: AC
Start: 2020-12-30 — End: 2020-12-30
  Administered 2020-12-30: 1 via ORAL

## 2020-12-30 MED ORDER — CHLORHEXIDINE GLUCONATE CLOTH 2 % EX PADS: 6.0000 | MEDICATED_PAD | Freq: Every day | CUTANEOUS | Status: DC

## 2020-12-30 MED ORDER — OXYCODONE HCL 5 MG PO TABS: 5.0000 mg | ORAL_TABLET | Freq: Three times a day (TID) | ORAL | 0 refills | Status: AC | PRN

## 2020-12-30 MED ORDER — HEPARIN SOD (PORK) LOCK FLUSH 100 UNIT/ML IV SOLN
250.0000 [IU] | INTRAVENOUS | Status: AC | PRN
  Administered 2020-12-30: 250 [IU]
  Filled 2020-12-30: qty 2.5

## 2020-12-30 MED ORDER — NALOXONE HCL 0.4 MG/ML IJ SOLN: 0.4000 mg | INTRAMUSCULAR | Status: DC | PRN

## 2020-12-30 MED ORDER — METHSCOPOLAMINE BROMIDE 5 MG PO TABS: 5.0000 mg | ORAL_TABLET | Freq: Two times a day (BID) | ORAL | Status: DC

## 2020-12-30 MED ORDER — SODIUM CHLORIDE 0.9% FLUSH
10.0000 mL | INTRAVENOUS | Status: DC | PRN
Start: 2020-12-30 — End: 2020-12-30

## 2020-12-30 MED ORDER — CHLORHEXIDINE GLUCONATE 0.12 % MT SOLN
15.0000 mL | Freq: Three times a day (TID) | OROMUCOSAL | 1 refills | Status: DC
Start: 2020-12-30 — End: 2021-12-17

## 2020-12-30 MED ORDER — OXYCODONE-ACETAMINOPHEN 5-325 MG PO TABS
1.0000 | ORAL_TABLET | ORAL | Status: DC | PRN
  Filled 2020-12-30: qty 1

## 2020-12-30 NOTE — Discharge Summary (Signed)
Physician Discharge Summary  [name] [name] AUQ:[id number] DOB: 24-Feb-1961 DOA: 12/29/2020  PCP: [name], MD  Admit date: 12/29/2020 Discharge date: 12/30/2020  Admitted From: Home  Disposition:  Home   Recommendations for Outpatient Follow-up:  Follow up with Dr. Conley Simmonds as directed  Follow up with Dr. Linus Salmons on Jul 22 Continue weekly CBC while on ertapenem        Home Health: None   Equipment/Devices: None new  Discharge Condition: Good  CODE STATUS: FULL Diet recommendation: Full Liquid  Brief/Interim Summary: [name] [name] is a 60 y.o. M with DM, seizures, Crohn's disease on mesalamine, NASH cirrhosis, pancytopenia and recent mandibular osteomyelitis who presented for further debridement of his jaw and for whom the hospitalist service was asked to observe overnight.  Went to the OR yesterday with Dr. Conley Simmonds for debridement of mandible and tooth removal after having some increased redness and swelling of his jaw.  Post-operatively he was hemodynamically stable, but the hospitalist service were asked to monitor overnight for bleeding.        PRINCIPAL HOSPITAL DIAGNOSIS: Mandibular osteomyelitis    Discharge Diagnoses:   Osteomyelitis of the right mandible S/p debridement of the mandible and surgical removal of #31 tooth by Dr. Conley Simmonds on 7/8 The patient was observed overnight.  In the morning he had had no excessive bleeding from his surgical site, no fever, and was able to tolerate oral pain medication and a full liquid diet.  He was discharged to continue his ertapenem, has oral surgery and infectious disease follow-up.   Diabetes with infectious complications, no long term insulin  Depression  Seizures No seizures post-operatively  Crohn's disease No active disease  NASH cirrhosis Compensated  Pancytopenia Due to cirrhosis.  Weekly CBC/D being ordered with his OPAT.        Discharge Instructions  Discharge Instructions     Discharge  instructions   Complete by: As directed    From Dr. Loleta Books: If you have bleeding from the mouth that doesn't stop after an hour, call Dr. Cheri Fowler on call number and come to the ER  Resume your Ertapenem and Diflucan  (Next dose ertapenem is tomorrow)  Use the Peridex rinse three times daily  Use oxycodone plus acetaminophen for pain You may alternate with ibuprofen 400 mg (two tabs)  Do not take oxycodone with hydrocodone or your sleeping pill   Increase activity slowly   Complete by: As directed    No wound care   Complete by: As directed       Allergies as of 12/30/2020       Reactions   Azithromycin Swelling   Beef-derived Products Diarrhea   RED MEAT > UNSPECIFIED REACTION    Milk-related Compounds Diarrhea   DAIRY >UNSPECIFIED REACTION    Penicillins Hives   Rash as child- tolerates Ancef/CHILDHOOD ALLERGY Has patient had a PCN reaction causing immediate rash, facial/tongue/throat swelling, SOB or lightheadedness with hypotension: YES Has patient had a PCN reaction causing severe rash involving mucus membranes or skin necrosis: Unknown Has patient had a PCN reaction that required hospitalization: Unknown Has patient had a PCN reaction occurring within the last 10 years:Unknown If all of the above answers are "NO", then may proceed with Cephalosporin [place]   Sulfonamide Derivatives Hives   Dilaudid [hydromorphone Hcl] Nausea And Vomiting        Medication List     STOP taking these medications    HYDROcodone-acetaminophen 5-325 MG tablet Commonly known as: NORCO/VICODIN  TAKE these medications    CALCIUM 600+D PO Take 1 tablet by mouth 2 (two) times daily.   chlorhexidine 0.12 % solution Commonly known as: PERIDEX Use as directed 15 mLs in the mouth or throat in the morning, at noon, and at bedtime. What changed: when to take this   cholecalciferol 1000 units tablet Commonly known as: VITAMIN D Take 1,000 Units at bedtime by mouth.   DAIRY  RELIEF PO Take 2 tablets by mouth in the morning and at bedtime.   ertapenem  IVPB Commonly known as: INVANZ Inject 1 g into the vein daily. Indication:  Mandibular osteomyelitis  First Dose: Yes Last Day of Therapy:  01/18/21 Labs - Once weekly:  CBC/D and BMP, Labs - Every other week:  ESR and CRP Method of administration: Mini-Bag Plus / Gravity Method of administration may be changed at the discretion of home infusion pharmacist based upon assessment of the patient and/or caregiver's ability to self-administer the medication ordered.   Eszopiclone 3 MG Tabs Take 3 mg by mouth at bedtime. Take immediately before bedtime   feeding supplement Liqd Take 237 mLs by mouth 2 (two) times daily between meals.   fluconazole 200 MG tablet Commonly known as: DIFLUCAN Take 2 tablets (400 mg total) by mouth daily.   folic acid 1 MG tablet Commonly known as: FOLVITE Take 2 tablets (2 mg total) by mouth daily.   gabapentin 300 MG capsule Commonly known as: NEURONTIN Take 1 capsule (300 mg total) by mouth daily.   ONE TOUCH ULTRA TEST test strip Generic drug: glucose blood USE AS INSTRUCTED   glucose blood test strip Commonly known as: Choice DM Fora G20 Test Strips Use as instructed   lamoTRIgine 150 MG tablet Commonly known as: LaMICtal Take 1 tablet (150 mg total) by mouth 2 (two) times daily.   latanoprost 0.005 % ophthalmic solution Commonly known as: XALATAN Place 1 drop into both eyes at bedtime.   lidocaine 2 % solution Commonly known as: XYLOCAINE Use as directed 15 mLs in the mouth or throat as needed for mouth pain.   mercaptopurine 50 MG tablet Commonly known as: PURINETHOL TAKE 2 TABLETS DAILY ON AN EMPTY STOMACH 1 HOUR BEFOREOR 2 HOURS AFTER A MEAL.   CAUTION: CHEMOTHERAPY   mesalamine 1.2 g EC tablet Commonly known as: LIALDA TAKE 2 TABLETS DAILY WITH  BREAKFAST What changed:  how much to take how to take this when to take this additional instructions    mirabegron ER 25 MG Tb24 tablet Commonly known as: MYRBETRIQ Take 25 mg by mouth at bedtime.   multivitamin with minerals Tabs tablet Take 1 tablet by mouth daily.   niacinamide 100 MG tablet Take 100 mg by mouth 2 (two) times daily.   ondansetron 4 MG tablet Commonly known as: ZOFRAN Take 1 tablet (4 mg total) by mouth every 6 (six) hours as needed for nausea.   oxyCODONE 5 MG immediate release tablet Commonly known as: Roxicodone Take 1 tablet (5 mg total) by mouth every 8 (eight) hours as needed for up to 5 days.   potassium chloride SA 20 MEQ tablet Commonly known as: KLOR-CON Take 1 tablet (20 mEq total) by mouth 2 (two) times daily.   PROBIOTIC PO Take 1 tablet at bedtime by mouth.   sertraline 100 MG tablet Commonly known as: ZOLOFT Take 200 mg daily with breakfast by mouth.   tamsulosin 0.4 MG Caps capsule Commonly known as: FLOMAX Take 0.4 mg by mouth at bedtime as  needed (blood in urine).   traZODone 150 MG tablet Commonly known as: DESYREL Take 150 mg by mouth at bedtime.   Vitamin B Complex Tabs Take 1 tablet by mouth every evening.   vitamin C 500 MG tablet Commonly known as: ASCORBIC ACID Take 500 mg by mouth daily.       ASK your doctor about these medications    esomeprazole 40 MG capsule Commonly known as: NEXIUM TAKE 1 CAPSULE TWICE DAILY   methscopolamine 5 MG tablet Commonly known as: PAMINE FORTE TAKE 1 TABLET TWICE A DAY   Xifaxan 550 MG Tabs tablet Generic drug: rifaximin TAKE 1 TABLET TWICE A DAY        Follow-up Information     [name], DMD. Schedule an appointment as soon as possible for a visit in 1 week(s).   Specialty: Oral Surgery Contact information: [place] [address] [phone]         Thayer Headings, MD. Go to.   Specialty: Infectious Diseases Why: On Jul 22 at 10:45 am Contact information: [address] [place] [address] [phone]                 Allergies  Allergen Reactions   Azithromycin Swelling   Beef-Derived Products Diarrhea    RED MEAT > UNSPECIFIED REACTION    Milk-Related Compounds Diarrhea    DAIRY >UNSPECIFIED REACTION    Penicillins Hives    Rash as child- tolerates Ancef/CHILDHOOD ALLERGY Has patient had a PCN reaction causing immediate rash, facial/tongue/throat swelling, SOB or lightheadedness with hypotension: YES Has patient had a PCN reaction causing severe rash involving mucus membranes or skin necrosis: Unknown Has patient had a PCN reaction that required hospitalization: Unknown Has patient had a PCN reaction occurring within the last 10 years:Unknown If all of the above answers are "NO", then may proceed with Cephalosporin [place]   Sulfonamide Derivatives Hives   Dilaudid [Hydromorphone Hcl] Nausea And Vomiting    Consultations: Oral surgey   Procedures/Studies: DG Orthopantogram  Result Date: 12/06/2020 CLINICAL DATA:  Jaw pain, initial encounter EXAM: ORTHOPANTOGRAM/PANORAMIC COMPARISON:  Maxillofacial CT from the previous day. FINDINGS: Panoramic view of the mandible shows no acute fracture. Dental hardware is noted bilaterally. No periapical abscess is seen. No acute abnormality is noted. IMPRESSION: No acute abnormality seen. Electronically Signed   By: Inez Catalina M.D.   On: 12/06/2020 09:17   CT Head Wo Contrast  Result Date: 12/04/2020 CLINICAL DATA:  Acute onset altered mental status. EXAM: CT HEAD WITHOUT CONTRAST TECHNIQUE: Contiguous axial images were obtained from the base of the skull through the vertex without intravenous contrast. COMPARISON:  09/08/2020 FINDINGS: Brain: No evidence of acute infarction, hemorrhage, hydrocephalus, extra-axial collection or mass lesion/mass effect. Mild atrophy most prominent in the frontal regions. Vascular: Mild intracranial arterial vascular calcifications. Skull: Calvarium appears intact. Sinuses/Orbits: Paranasal sinuses and mastoid air  cells are clear. Other: No significant changes since previous study. IMPRESSION: No acute intracranial abnormalities.  Mild cerebral atrophy. Electronically Signed   By: Lucienne Capers M.D.   On: 12/04/2020 21:00   CT MAXILLOFACIAL W CONTRAST  Result Date: 12/05/2020 CLINICAL DATA:  The lies of the face. EXAM: CT MAXILLOFACIAL WITH CONTRAST TECHNIQUE: Multidetector CT imaging of the maxillofacial structures was performed with intravenous contrast. Multiplanar CT image reconstructions were also generated. CONTRAST:  131m OMNIPAQUE IOHEXOL 300 MG/ML  SOLN COMPARISON:  None. FINDINGS: Osseous: No fracture or mandibular dislocation. No destructive process. Orbits:  Negative. No traumatic or inflammatory finding. Sinuses: Mild scattered ethmoid air cell mucosal thickening. Right frontal sinus osteoma. Remaining sinuses are clear. No mastoid effusions. Soft tissues: Edema and soft tissue thickening overlying the right anterior mandible, compatible with cellulitis/phlegmon. Streak artifact from adjacent dental amalgam limits evaluation without visible drainable fluid collection. Limited intracranial: No obvious acute intracranial abnormality in the visualized brain on limited assessment. IMPRESSION: Edema and soft tissue thickening overlying the right anterior mandible, compatible with cellulitis/phlegmon. Streak artifact from adjacent dental amalgam limits evaluation without visible drainable fluid collection. Electronically Signed   By: Margaretha Sheffield MD   On: 12/05/2020 14:49   DG Chest Port 1 View  Result Date: 12/04/2020 CLINICAL DATA:  Right-sided facial pain.  Febrile. EXAM: PORTABLE CHEST 1 VIEW COMPARISON:  09/08/2020 FINDINGS: Shallow inspiration with elevation of the right hemidiaphragm. Heart size and pulmonary vascularity are normal for technique. No airspace disease or consolidation in the lungs. No pleural effusions. No pneumothorax. Multiple old appearing right rib fractures. Postoperative  changes in the distal right clavicle. Calcification of the aorta. Degenerative changes in the spine and shoulders. IMPRESSION: Shallow inspiration.  No evidence of active pulmonary disease. Electronically Signed   By: Lucienne Capers M.D.   On: 12/04/2020 20:44   TEMPORAL ARTERY  Result Date: 12/06/2020  TEMPORAL ARTERY REPORT Patient Name:  GARV KUECHLE  Date of Exam:   12/06/2020 Medical Rec #: [id number]      Accession #:    [id number] Date of Birth: [birth year]      Patient Gender: M Patient Age:   57Y Exam Location:  [facility] Procedure:      VAS [place] ARTERY BILATERAL Referring Phys: [id number] JENNIFER YATES --------------------------------------------------------------------------------  Indications: Jaw pain, throat pain and scalp tenderness. High Risk Factors: Age > 50 yrs.  Performing Technologist: Maudry Mayhew MHA, RDMS, RVT, RDCS  Examination Guidelines: Patient in reclined position. 2D, color and spectral doppler sampling in the temporal artery along the hairline and temple in the longitudinal plane. 2D images along the hairline and temple in the transverse plane. Exam is bilateral.  Summary: Absence of a "halo" sign in the bilateral temporal artery, although not definitive, makes a diagnosis of temporal arteritis unlikely.  *See table(s) above for measurements and observations.  Electronically signed by Harold Barban MD on 12/06/2020 at 7:50:31 PM.   Final    [place] EKG SITE RITE  Result Date: 12/08/2020 If Site Rite image not attached, placement could not be confirmed due to current cardiac rhythm.  US Abdomen Limited RUQ (LIVER/GB)  Result Date: 12/27/2020 CLINICAL DATA:  Cirrhosis EXAM: ULTRASOUND ABDOMEN LIMITED RIGHT UPPER QUADRANT COMPARISON:  CT August 11, 2020 FINDINGS: Gallbladder: No gallstones or wall thickening visualized. No sonographic Murphy sign noted by sonographer. Common bile duct: Diameter: 3 mm Liver: No focal lesion identified. Coarsened hepatic  echotexture with nodular hepatic contour, consistent with given diagnosis of cirrhosis. Portal vein is patent on color Doppler imaging with normal direction of blood flow towards the liver. Other: None. IMPRESSION: Cirrhotic hepatic morphology.  No focal liver lesion identified. Electronically Signed   By: Dahlia Bailiff MD   On: 12/27/2020 15:58      Subjective: No fever overnight.  Pain controlled with oxycodone.  Appetite good.  No confusion, malaise.  Scant bleeding/oozing from his oral surgery site.  Discharge Exam: Vitals:   12/29/20 2107 12/30/20 0900  BP: 134/76 112/75  Pulse: 72 70  Resp: 16 16  Temp: 97.6 F (36.4 C) 98.1  F (36.7 C)  SpO2: 95% 96%   Vitals:   12/29/20 1925 12/29/20 1955 12/29/20 2107 12/30/20 0900  BP: 129/81 124/78 134/76 112/75  Pulse: 72 69 72 70  Resp: _0 Temp:  98 F (36.7 C) 97.6 F (36.4 C) 98.1 F (36.7 C)  TempSrc:   Oral Oral  SpO2: 100% 100% 95% 96%  Weight:      Height:        General: Pt is alert, awake, not in acute distress HEENT: Oropharynx moist, the right lower mandible surgical site appears clean, without pus.  There is scant oozing, but no significant blood. Cardiovascular: RRR, nl S1-S2, no murmurs appreciated.   No LE edema.   Respiratory: Normal respiratory rate and rhythm.  CTAB without rales or wheezes. Abdominal: Abdomen soft and non-tender.  No distension or HSM.   Neuro/Psych: Strength symmetric in upper and lower extremities.  Judgment and insight appear normal.   The results of significant diagnostics from this hospitalization (including imaging, microbiology, ancillary and laboratory) are listed below for reference.     Microbiology: Recent Results (from the past 240 hour(s))  SARS CORONAVIRUS 2 (TAT 6-24 HRS) Nasopharyngeal Nasopharyngeal Swab     Status: None   Collection Time: 12/28/20  9:18 AM   Specimen: Nasopharyngeal Swab  Result Value Ref Range Status   SARS Coronavirus 2 NEGATIVE NEGATIVE  Final    Comment: (NOTE) SARS-CoV-2 target nucleic acids are NOT DETECTED.  The SARS-CoV-2 RNA is generally detectable in upper and lower respiratory specimens during the acute phase of infection. Negative results do not preclude SARS-CoV-2 infection, do not rule out co-infections with other pathogens, and should not be used as the sole basis for treatment or other patient management decisions. Negative results must be combined with clinical observations, patient history, and epidemiological information. The expected result is Negative.  Fact Sheet for Patients: SugarRoll.be  Fact Sheet for Healthcare Providers: https://www.woods-mathews.com/  This test is not yet approved or cleared by the [place] FDA and  has been authorized for detection and/or diagnosis of SARS-CoV-2 by FDA under an Emergency Use Authorization (EUA). This EUA will remain  in effect (meaning this test can be used) for the duration of the COVID-19 declaration under Se ction 564(b)(1) of the Act, 21 U.S.C. section 360bbb-3(b)(1), unless the authorization is terminated or revoked sooner.  Performed at [facility], [place] [address], [place], [place] [address]      Labs: BNP (last 3 results) No results for input(s): BNP in the last 8760 hours. Basic Metabolic Panel: Recent Labs  Lab 12/29/20 1340 12/30/20 0121  NA 139 134*  K 3.8 4.1  CL 107 103  CO2 22 25  GLUCOSE 74 252*  BUN 10 12  CREATININE 0.80 0.96  CALCIUM 9.1 9.1  MG  --  1.6*   Liver Function Tests: No results for input(s): AST, ALT, ALKPHOS, BILITOT, PROT, ALBUMIN in the last 168 hours. No results for input(s): LIPASE, AMYLASE in the last 168 hours. No results for input(s): AMMONIA in the last 168 hours. CBC: Recent Labs  Lab 12/29/20 1436 12/30/20 0121  WBC 2.3* 2.0*  NEUTROABS 1.4* 1.8  HGB 10.1* 10.4*  HCT 29.2* 30.5*  MCV 107.7* 108.2*  PLT 55* 51*   Cardiac Enzymes: No  results for input(s): CKTOTAL, CKMB, CKMBINDEX, TROPONINI in the last 168 hours. BNP: Invalid input(s): POCBNP CBG: Recent Labs  Lab 12/29/20 1656 12/29/20 1746 12/29/20 2101 12/30/20 0624 12/30/20 1245  GLUCAP 67* 115* 126* 125* 134*   D-Dimer No results for input(s): DDIMER in the last 72 hours. Hgb A1c No results for input(s): HGBA1C in the last 72 hours. Lipid Profile No results for input(s): CHOL, HDL, LDLCALC, TRIG, CHOLHDL, LDLDIRECT in the last 72 hours. Thyroid function studies No results for input(s): TSH, T4TOTAL, T3FREE, THYROIDAB in the last 72 hours.  Invalid input(s): FREET3 Anemia work up No results for input(s): VITAMINB12, FOLATE, FERRITIN, TIBC, IRON, RETICCTPCT in the last 72 hours. Urinalysis    Component Value Date/Time   COLORURINE YELLOW 12/04/2020 2018   APPEARANCEUR CLEAR 12/04/2020 2018   APPEARANCEUR Clear 01/12/2020 0906   LABSPEC 1.018 12/04/2020 2018   PHURINE 7.5 12/04/2020 2018   GLUCOSEU NEGATIVE 12/04/2020 2018   HGBUR LARGE (A) 12/04/2020 2018   BILIRUBINUR NEGATIVE 12/04/2020 2018   BILIRUBINUR Negative 01/12/2020 0906   KETONESUR NEGATIVE 12/04/2020 2018   PROTEINUR 30 (A) 12/04/2020 2018   UROBILINOGEN 4.0 (H) 12/02/2019 1805   NITRITE NEGATIVE 12/04/2020 2018   LEUKOCYTESUR NEGATIVE 12/04/2020 2018   Sepsis Labs Invalid input(s): PROCALCITONIN,  WBC,  LACTICIDVEN Microbiology Recent Results (from the past 240 hour(s))  SARS CORONAVIRUS 2 (TAT 6-24 HRS) Nasopharyngeal Nasopharyngeal Swab     Status: None   Collection Time: 12/28/20  9:18 AM   Specimen: Nasopharyngeal Swab  Result Value Ref Range Status   SARS Coronavirus 2 NEGATIVE NEGATIVE Final    Comment: (NOTE) SARS-CoV-2 target nucleic acids are NOT DETECTED.  The SARS-CoV-2 RNA is generally detectable in upper and lower respiratory specimens during the acute phase of infection. Negative results do not preclude SARS-CoV-2 infection, do not rule out co-infections  with other pathogens, and should not be used as the sole basis for treatment or other patient management decisions. Negative results must be combined with clinical observations, patient history, and epidemiological information. The expected result is Negative.  Fact Sheet for Patients: SugarRoll.be  Fact Sheet for Healthcare Providers: https://www.woods-mathews.com/  This test is not yet approved or cleared by the [place] FDA and  has been authorized for detection and/or diagnosis of SARS-CoV-2 by FDA under an Emergency Use Authorization (EUA). This EUA will remain  in effect (meaning this test can be used) for the duration of the COVID-19 declaration under Se ction 564(b)(1) of the Act, 21 U.S.C. section 360bbb-3(b)(1), unless the authorization is terminated or revoked sooner.  Performed at [facility], [place] [address] [place], [place] [address]      Time coordinating discharge: 25 minutes The [place] controlled substances registry was reviewed for this patient prior to filling the <5 days supply controlled substances script.    30 Day Unplanned Readmission Risk Score    Flowsheet Row ED to Hosp-Admission (Discharged) from 12/04/2020 in [facility]  SURGICAL  30 Day Unplanned Readmission Risk Score (%) 21.94 Filed at 12/11/2020 1600       This score is the patient's risk of an unplanned readmission within 30 days of being discharged (0 -100%). The score is based on dignosis, age, lab data, medications, orders, and past utilization.   Low:  0-14.9   Medium: 15-21.9   High: 22-29.9   Extreme: 30 and above             SIGNED:   Edwin Dada, MD  [facility] 12/30/2020, 4:08 PM

## 2020-12-30 NOTE — Plan of Care (Signed)
  Problem: Activity: Goal: Risk for activity intolerance will decrease Outcome: Progressing   Problem: Coping: Goal: Level of anxiety will decrease Outcome: Progressing   Problem: Pain Managment: Goal: General experience of comfort will improve Outcome: Progressing   Problem: Safety: Goal: Ability to remain free from injury will improve Outcome: Progressing   Problem: Skin Integrity: Goal: Risk for impaired skin integrity will decrease Outcome: Progressing   

## 2020-12-30 NOTE — Plan of Care (Signed)
  Problem: Health Behavior/Discharge Planning: Goal: Ability to manage health-related needs will improve Outcome: Adequate for Discharge to go home with spouse

## 2021-01-01 ENCOUNTER — Telehealth: Payer: Self-pay

## 2021-01-01 NOTE — Telephone Encounter (Signed)
Verbal orders given to [facility] with [facility] to continue IV invanz until 8/8.   Elazar Argabright Lorita Officer, RN

## 2021-01-01 NOTE — Telephone Encounter (Signed)
Highly doubt it is related to his ertapenem or fluconazole. Appears patient has cirrhosis as well.

## 2021-01-01 NOTE — Telephone Encounter (Signed)
Received call from [facility] to report critical lab for patient's platelets.   Platelets reported to be 66.   Awaiting fax with remaining results.   Jvon Meroney Lorita Officer, RN

## 2021-01-05 LAB — FUNGUS CULTURE WITH STAIN

## 2021-01-05 LAB — FUNGUS CULTURE RESULT

## 2021-01-05 LAB — FUNGAL ORGANISM REFLEX

## 2021-01-08 ENCOUNTER — Other Ambulatory Visit: Payer: Self-pay | Admitting: Gastroenterology

## 2021-01-08 DIAGNOSIS — M4810 Ankylosing hyperostosis [Forestier], site unspecified: Secondary | ICD-10-CM | POA: Diagnosis not present

## 2021-01-08 DIAGNOSIS — M542 Cervicalgia: Secondary | ICD-10-CM | POA: Diagnosis not present

## 2021-01-12 ENCOUNTER — Telehealth (INDEPENDENT_AMBULATORY_CARE_PROVIDER_SITE_OTHER): Payer: 59 | Admitting: Internal Medicine

## 2021-01-12 ENCOUNTER — Other Ambulatory Visit: Payer: Self-pay

## 2021-01-12 DIAGNOSIS — M272 Inflammatory conditions of jaws: Secondary | ICD-10-CM | POA: Diagnosis not present

## 2021-01-12 LAB — FUNGAL ORGANISM REFLEX

## 2021-01-12 LAB — FUNGUS CULTURE WITH STAIN

## 2021-01-12 LAB — FUNGUS CULTURE RESULT

## 2021-01-12 NOTE — Progress Notes (Signed)
   Subjective:    Patient ID: [name] [name], male    DOB: 23-Aug-1960, 60 y.o.   MRN: [id number]  I connected with  SHRAGA CUSTARD on 01/12/21 by telephone and verified that I am speaking with the correct person using two identifiers.   I discussed the limitations of evaluation and management by telemedicine. The patient expressed understanding and agreed to proceed.  Location: Patient -  residence Physician - clinic  Duration of visit:  14 minutes  HPI He is called for follow up of mandibular osteomyelitis.   He has been on ertapenem since his hospitalization and blood culture positive for Strep intermedius.  Plan initially for 6 weeks IV antibiotics but this was extended last visit through August 8th since he required further surgical debridement.  He is still experiencing significant pain.  He has seen Dr. Conley Simmonds who has recommended he stay on antibiotics through the end of august due to some gum inflammation. He is having no associated rash or diarrhea.     Review of Systems  Skin:  Negative for rash.      Objective:   Physical Exam Neurological:     Mental Status: He is alert.  Psychiatric:        Mood and Affect: Mood normal.          Assessment & Plan:

## 2021-01-13 ENCOUNTER — Encounter: Payer: Self-pay | Admitting: Internal Medicine

## 2021-01-13 MED ORDER — AMOXICILLIN-POT CLAVULANATE 875-125 MG PO TABS: 1.0000 | ORAL_TABLET | Freq: Two times a day (BID) | ORAL | 0 refills | Status: DC

## 2021-01-13 NOTE — Assessment & Plan Note (Signed)
I discussed the treatment with him and his wife.  At this point, nearly 8 weeks of antibiotics via picc line with ertapenem is adequate for treatment of his osteomyelitis and will have the ertapenem stopped at that point and picc line removed.  Concern was raised of ongoing infection and I am unable to examine him since he is unable to come in to my office.  Therefore I will have him continue with oral amoxicillin/clavulanate after completion of IV antibiotics.  He has listed a penicillin allergy and in discussion with him it is remote, from childhood and he thinks he developed a rash.  I discussed the options with him and he opted to try oral amoxicillin/clavulanate and will notify our office if there is any adverse reaction.  He did not have any significant hives, throat swelling or significant concerns.

## 2021-01-14 ENCOUNTER — Emergency Department (HOSPITAL_COMMUNITY): Payer: 59

## 2021-01-14 ENCOUNTER — Other Ambulatory Visit: Payer: Self-pay

## 2021-01-14 ENCOUNTER — Emergency Department (HOSPITAL_COMMUNITY)
Admission: EM | Admit: 2021-01-14 | Discharge: 2021-01-15 | Disposition: A | Discharge status: Home / Self Care | Payer: 59 | Attending: Emergency Medicine | Admitting: Emergency Medicine

## 2021-01-14 DIAGNOSIS — Y9301 Activity, walking, marching and hiking: Secondary | ICD-10-CM | POA: Insufficient documentation

## 2021-01-14 DIAGNOSIS — S0101XA Laceration without foreign body of scalp, initial encounter: Secondary | ICD-10-CM | POA: Diagnosis not present

## 2021-01-14 DIAGNOSIS — Z85828 Personal history of other malignant neoplasm of skin: Secondary | ICD-10-CM | POA: Insufficient documentation

## 2021-01-14 DIAGNOSIS — W19XXXA Unspecified fall, initial encounter: Secondary | ICD-10-CM | POA: Diagnosis not present

## 2021-01-14 DIAGNOSIS — M25511 Pain in right shoulder: Secondary | ICD-10-CM | POA: Diagnosis not present

## 2021-01-14 DIAGNOSIS — S12591A Other nondisplaced fracture of sixth cervical vertebra, initial encounter for closed fracture: Secondary | ICD-10-CM | POA: Insufficient documentation

## 2021-01-14 DIAGNOSIS — R58 Hemorrhage, not elsewhere classified: Secondary | ICD-10-CM | POA: Diagnosis not present

## 2021-01-14 DIAGNOSIS — R55 Syncope and collapse: Secondary | ICD-10-CM

## 2021-01-14 DIAGNOSIS — W01198A Fall on same level from slipping, tripping and stumbling with subsequent striking against other object, initial encounter: Secondary | ICD-10-CM | POA: Insufficient documentation

## 2021-01-14 DIAGNOSIS — E119 Type 2 diabetes mellitus without complications: Secondary | ICD-10-CM | POA: Insufficient documentation

## 2021-01-14 DIAGNOSIS — I10 Essential (primary) hypertension: Secondary | ICD-10-CM | POA: Insufficient documentation

## 2021-01-14 DIAGNOSIS — S199XXA Unspecified injury of neck, initial encounter: Secondary | ICD-10-CM | POA: Diagnosis present

## 2021-01-14 DIAGNOSIS — M542 Cervicalgia: Secondary | ICD-10-CM | POA: Diagnosis not present

## 2021-01-14 DIAGNOSIS — R42 Dizziness and giddiness: Secondary | ICD-10-CM | POA: Diagnosis not present

## 2021-01-14 DIAGNOSIS — S12501A Unspecified nondisplaced fracture of sixth cervical vertebra, initial encounter for closed fracture: Secondary | ICD-10-CM

## 2021-01-14 LAB — COMPREHENSIVE METABOLIC PANEL
ALT: 34 U/L (ref 0–44)
AST: 42 U/L — ABNORMAL HIGH (ref 15–41)
Albumin: 3.6 g/dL (ref 3.5–5.0)
Alkaline Phosphatase: 124 U/L (ref 38–126)
Anion gap: 9 (ref 5–15)
BUN: 10 mg/dL (ref 6–20)
CO2: 24 mmol/L (ref 22–32)
Calcium: 9.6 mg/dL (ref 8.9–10.3)
Chloride: 108 mmol/L (ref 98–111)
Creatinine, Ser: 0.84 mg/dL (ref 0.61–1.24)
GFR, Estimated: >60 mL/min (ref >60)
Glucose, Bld: 124 mg/dL — ABNORMAL HIGH (ref 70–99)
Potassium: 3.8 mmol/L (ref 3.5–5.1)
Sodium: 141 mmol/L (ref 135–145)
Total Bilirubin: 1.4 mg/dL — ABNORMAL HIGH (ref 0.3–1.2)
Total Protein: 6.1 g/dL — ABNORMAL LOW (ref 6.5–8.1)

## 2021-01-14 LAB — CBC WITH DIFFERENTIAL/PLATELET
Abs Immature Granulocytes: 0.01 10*3/uL (ref 0.00–0.07)
Basophils Absolute: 0 10*3/uL (ref 0.0–0.1)
Basophils Relative: 1 %
Eosinophils Absolute: 0.1 10*3/uL (ref 0.0–0.5)
Eosinophils Relative: 5 %
HCT: 31.4 % — ABNORMAL LOW (ref 39.0–52.0)
Hemoglobin: 10.7 g/dL — ABNORMAL LOW (ref 13.0–17.0)
Immature Granulocytes: 0 %
Lymphocytes Relative: 23 %
Lymphs Abs: 0.7 10*3/uL (ref 0.7–4.0)
MCH: 36.5 pg — ABNORMAL HIGH (ref 26.0–34.0)
MCHC: 34.1 g/dL (ref 30.0–36.0)
MCV: 107.2 fL — ABNORMAL HIGH (ref 80.0–100.0)
Monocytes Absolute: 0.3 10*3/uL (ref 0.1–1.0)
Monocytes Relative: 12 %
Neutro Abs: 1.7 10*3/uL (ref 1.7–7.7)
Neutrophils Relative %: 59 %
Platelets: UNDETERMINED 10*3/uL (ref 150–400)
RBC: 2.93 MIL/uL — ABNORMAL LOW (ref 4.22–5.81)
RDW: 14.6 % (ref 11.5–15.5)
WBC: 2.8 10*3/uL — ABNORMAL LOW (ref 4.0–10.5)
nRBC: 0 % (ref 0.0–0.2)

## 2021-01-14 MED ORDER — OXYCODONE-ACETAMINOPHEN 5-325 MG PO TABS
1.0000 | ORAL_TABLET | Freq: Once | ORAL | Status: AC
  Administered 2021-01-14: 1 via ORAL
  Filled 2021-01-14: qty 1

## 2021-01-14 MED ORDER — MORPHINE SULFATE (PF) 4 MG/ML IV SOLN: 4.0000 mg | Freq: Once | INTRAVENOUS | Status: DC

## 2021-01-14 MED ORDER — LIDOCAINE-EPINEPHRINE (PF) 2 %-1:200000 IJ SOLN
10.0000 mL | Freq: Once | INTRAMUSCULAR | Status: AC
  Administered 2021-01-14: 10 mL
  Filled 2021-01-14: qty 20

## 2021-01-14 MED ORDER — MORPHINE SULFATE (PF) 4 MG/ML IV SOLN
4.0000 mg | Freq: Once | INTRAVENOUS | Status: AC
  Administered 2021-01-14: 4 mg via INTRAVENOUS
  Filled 2021-01-14: qty 1

## 2021-01-14 MED ORDER — LORAZEPAM 2 MG/ML IJ SOLN
1.0000 mg | Freq: Once | INTRAMUSCULAR | Status: AC
  Administered 2021-01-15: 1 mg via INTRAVENOUS
  Filled 2021-01-14: qty 1

## 2021-01-14 MED ORDER — OXYCODONE HCL 5 MG PO TABS: 5.0000 mg | ORAL_TABLET | Freq: Four times a day (QID) | ORAL | 0 refills | Status: AC | PRN

## 2021-01-14 NOTE — Discharge Instructions (Addendum)
Staples will need to be removed in 5 days  Follow up with neurosurgery for your neck fracture. DO not take off the hard collar.  Return for new or worsening symtoms

## 2021-01-14 NOTE — ED Provider Notes (Signed)
[facility] EMERGENCY DEPARTMENT Provider Note   CSN: [id number] Arrival date & time: 01/14/21  2045     History Chief Complaint  Patient presents with   [name] [name] is a 60 y.o. male with past medical history significant for seizure, Crohn's, diabetes, [name] cirrhosis who presents for evaluation of fall.  Patient states he was walking.  Felt like he was going to have a seizure, called his wife.  He is unsure if he passed out.  Hit the back of his head.  States he has known low platelets and typically "bleeds a lot."  He admits to some pain to his head, neck, right shoulder.  He was ambulatory at the scene where apparently there was about 400 cc blood.  States he has some chronic right shoulder pain.  No headache, numbness, weakness, chest pain, shortness breath, abdominal pain or to fall.  No tongue biting, urinary incontinence.  Denies additional aggravating or alleviating factors  History obtained from patient and past medical records.  No interpreter used  HPI     Past Medical History:  Diagnosis Date   Adenomatous colon polyp 10/1999   Anxiety    Arthritis    neck, yoga helps.   Cataract    Complex partial seizure ([place])    last seizure was 08-30-2018   Crohn's disease of small and large intestines ([place]) 1999   Depression    Diabetes mellitus    no meds at this time 10-24-17   Esophageal stricture    External hemorrhoids    GERD (gastroesophageal reflux disease)    Glaucoma    History of kidney stones    3 large stones still present   Hyperlipemia    Hypertension    resolved with weight loss    Inguinal hernia, bilateral 12/2019   Liver cirrhosis secondary to NASH ([place])    Migraines    Osteoporosis    PONV (postoperative nausea and vomiting)    PTSD (post-traumatic stress disorder)    Renal cyst, acquired, left 07/08/2017   Skin cancer    squamous cell multiple; [place]; followed every 3 months.   Sleep apnea    CPAP machine, uses  nightly   Staphylococcus aureus bacteremia with sepsis ([place]) 05/28/2017   MRSA 2012    Patient Active Problem List   Diagnosis Date Noted   Liver cirrhosis secondary to NASH ([place])    GERD (gastroesophageal reflux disease)    Pancytopenia ([place])    Osteomyelitis of jaw 12/29/2020   Acute osteomyelitis of mandible 12/26/2020   S/P inguinal hernia repair 01/14/2020   At high risk for falls    S/P shoulder surgery 03/05/2018   Excessive daytime sleepiness 02/18/2018   Crohn's disease of colon with rectal bleeding ([place]) 02/18/2018   Blood in stool 02/18/2018   Iron deficiency anemia due to chronic blood loss 02/18/2018   Iron deficiency anemia due to sideropenic dysphagia 02/18/2018   Renal calculus, left 02/02/2018   History of sepsis 12/16/2017   Renal cyst, acquired, left 07/08/2017   Frequent falls 07/08/2017   Renal lesion 05/28/2017   MSSA bacteremia 05/06/2017   Confusion 04/02/2017   Syncope 08/30/2016   Depression 04/30/2016   Memory loss 04/30/2016   OSA on CPAP 10/31/2015   Nonepileptic episode ([place]) 04/25/2015   Seizure ([place]) 04/04/2013   Abnormal EKG 03/12/2013   DOE (dyspnea on exertion) 03/12/2013   Complex partial seizure ([place]) 04/19/2012   DM2 (diabetes mellitus, type 2) ([place])  09/25/2011   OTHER DYSPHAGIA 03/27/2009   Cough 11/10/2008   GERD 12/07/2007   COLONIC POLYPS, ADENOMATOUS, HX OF 12/07/2007   EXTERNAL HEMORRHOIDS 09/11/2007   CROHN'S DISEASE, LARGE AND SMALL INTESTINES 09/11/2007   OSTEOPOROSIS 09/11/2007   HYPERLIPIDEMIA NEC/NOS 02/27/2007   Essential hypertension 02/27/2007    Past Surgical History:  Procedure Laterality Date   CATARACT EXTRACTION Bilateral    COLONOSCOPY     DEBRIDEMENT MANDIBLE N/A 12/07/2020   Procedure: DEBRIDEMENT OF MANDIBLE;  Surgeon: [name], DMD;  Location: [place];  Service: Oral Surgery;  Laterality: N/A;   ELBOW SURGERY  2012   elbow MRSA infection    EYE SURGERY     cataracts removed, /w IOL   GROSS  TEETH DEBRIDEMENT N/A 12/29/2020   Procedure: MANDIBULAR  DEBRIDEMENT;  Surgeon: [name], DMD;  Location: [place];  Service: Dentistry;  Laterality: N/A;   INCISION AND DRAINAGE ABSCESS N/A 12/07/2020   Procedure: INCISION AND DRAINAGE MANDIBULAR ABSCESS;  Surgeon: [name], DMD;  Location: [place];  Service: Oral Surgery;  Laterality: N/A;   INGUINAL HERNIA REPAIR Right 01/14/2020   Procedure: OPEN RIGHT HERNIA REPAIR INGUINAL WITH MESH;  Surgeon: [name], MD;  Location: [facility];  Service: General;  Laterality: Right;   INSERTION OF MESH N/A 07/01/2016   Procedure: INSERTION OF MESH;  Surgeon: [name], MD;  Location: [place];  Service: General;  Laterality: N/A;   IR URETERAL STENT LEFT NEW ACCESS W/O SEP NEPHROSTOMY CATH  02/02/2018   NEPHROLITHOTOMY Left 02/02/2018   Procedure: NEPHROLITHOTOMY PERCUTANEOUS;  Surgeon: [name], MD;  Location: [facility];  Service: Urology;  Laterality: Left;   POLYPECTOMY     SCALP LACERATION REPAIR Right 10/16/2017   From fall/staples   SHOULDER ARTHROSCOPY Right 03/05/2018   Procedure: ARTHROSCOPY SHOULDER AND OPEN DISTAL CLAVICLE EXCISION;  Surgeon: [name], MD;  Location: [place];  Service: Orthopedics;  Laterality: Right;   SHOULDER SURGERY     SINUS SURGERY WITH INSTATRAK     TEE WITHOUT CARDIOVERSION N/A 05/09/2017   Procedure: TRANSESOPHAGEAL ECHOCARDIOGRAM (TEE);  Surgeon: [name], MD;  Location: [facility];  Service: Cardiovascular;  Laterality: N/A;   TOOTH EXTRACTION N/A 12/07/2020   Procedure: DENTAL EXTRACTIONS OF TEETH TWENTY TO THIRTY;  Surgeon: [name], DMD;  Location: [place];  Service: Oral Surgery;  Laterality: N/A;   TOOTH EXTRACTION N/A 12/29/2020   Procedure: DENTAL RESTORATION/EXTRACTIONS;  Surgeon: [name], DMD;  Location: [place];  Service: Dentistry;  Laterality: N/A;   TRANSTHORACIC ECHOCARDIOGRAM  02/22/2011   EF 55-65%; increased pattern of LVH with mild conc hypertrophy,  abnormal relaxation & increased filling pressure (grade 2 diastolic dysfunction); atrial septum thickened (lipomatous hypertrophy)   UMBILICAL HERNIA REPAIR N/A 07/01/2016   Procedure: UMBILICAL HERNIA REPAIR WITH MESH;  Surgeon: [name], MD;  Location: [place];  Service: General;  Laterality: N/A;   UPPER GASTROINTESTINAL ENDOSCOPY     VASECTOMY         Family History  Problem Relation Age of Onset   Colon polyps Father    Stroke Father    Hyperlipidemia Father    Hypertension Father    Heart disease Mother        CABG at age 55   Hyperlipidemia Mother    Hypertension Mother    Stroke Paternal Grandmother    Stroke Paternal Grandfather    Other Child        tetrology of fallot (  cornealia deland syndrome)   Colon cancer Neg Hx    Esophageal cancer Neg Hx    Stomach cancer Neg Hx    Rectal cancer Neg Hx     Social History   Tobacco Use   Smoking status: Never   Smokeless tobacco: Never  Vaping Use   Vaping Use: Never used  Substance Use Topics   Alcohol use: No   Drug use: No    Home Medications Prior to Admission medications   Medication Sig Start Date End Date Taking? Authorizing Provider  amoxicillin-clavulanate (AUGMENTIN) 875-125 MG tablet Take 1 tablet by mouth 2 (two) times daily. 01/30/21   Thayer Headings, MD  B Complex Vitamins (VITAMIN B COMPLEX) TABS Take 1 tablet by mouth every evening.    [provider]  Calcium Carbonate-Vitamin D (CALCIUM 600+D PO) Take 1 tablet by mouth 2 (two) times daily.    [provider]  chlorhexidine (PERIDEX) 0.12 % solution Use as directed 15 mLs in the mouth or throat in the morning, at noon, and at bedtime. 12/30/20   Danford, Suann Larry, MD  cholecalciferol (VITAMIN D) 1000 units tablet Take 1,000 Units at bedtime by mouth.     [provider]  ertapenem (INVANZ) IVPB Inject 1 g into the vein daily. Indication:  Mandibular osteomyelitis  First Dose: Yes Last Day of Therapy:  01/18/21 Labs -  Once weekly:  CBC/D and BMP, Labs - Every other week:  ESR and CRP Method of administration: Mini-Bag Plus / Gravity Method of administration may be changed at the discretion of home infusion pharmacist based upon assessment of the patient and/or caregiver's ability to self-administer the medication ordered. 12/08/20 01/18/21  Thayer Headings, MD  esomeprazole (NEXIUM) 40 MG capsule TAKE 1 CAPSULE TWICE DAILY Patient taking differently: Take 40 mg by mouth 2 (two) times daily before a meal. 10/02/20   Ladene Artist, MD  Eszopiclone 3 MG TABS Take 3 mg by mouth at bedtime. Take immediately before bedtime    [provider]  feeding supplement (ENSURE ENLIVE / ENSURE PLUS) LIQD Take 237 mLs by mouth 2 (two) times daily between meals. 12/11/20   Cristal Deer, MD  fluconazole (DIFLUCAN) 200 MG tablet Take 2 tablets (400 mg total) by mouth daily. 12/12/20   Thayer Headings, MD  folic acid (FOLVITE) 1 MG tablet Take 2 tablets (2 mg total) by mouth daily. 01/11/20   Daleen Squibb, MD  gabapentin (NEURONTIN) 300 MG capsule Take 1 capsule (300 mg total) by mouth daily. 12/11/20   Cristal Deer, MD  glucose blood (CHOICE DM FORA G20 TEST STRIPS) test strip Use as instructed 11/13/12   [name], MD  Lactase (DAIRY RELIEF PO) Take 2 tablets by mouth in the morning and at bedtime.    [provider]  lamoTRIgine (LAMICTAL) 150 MG tablet Take 1 tablet (150 mg total) by mouth 2 (two) times daily. 11/09/20   Lomax, Amy, NP  latanoprost (XALATAN) 0.005 % ophthalmic solution Place 1 drop into both eyes at bedtime.     [provider]  lidocaine (XYLOCAINE) 2 % solution Use as directed 15 mLs in the mouth or throat as needed for mouth pain. 11/23/20   Arnaldo Natal, MD  mercaptopurine (PURINETHOL) 50 MG tablet TAKE 2 TABLETS DAILY ON AN               EMPTY STOMACH 1 HOUR BEFORE  OR 2 HOURS AFTER A MEAL.                 CAUTION: CHEMOTHERAPY. NEED APPOINTMENT.  01/08/21   Ladene Artist, MD  mesalamine (LIALDA) 1.2 g EC tablet TAKE 2 TABLETS DAILY WITH  BREAKFAST 10/02/20   Ladene Artist, MD  methscopolamine (PAMINE FORTE) 5 MG tablet TAKE 1 TABLET TWICE A DAY Patient taking differently: Take 5 mg by mouth 2 (two) times daily. 10/02/20   Ladene Artist, MD  mirabegron ER (MYRBETRIQ) 25 MG TB24 tablet Take 25 mg by mouth at bedtime.    [provider]  Multiple Vitamin (MULTIVITAMIN WITH MINERALS) TABS tablet Take 1 tablet by mouth daily. 12/12/20   Cristal Deer, MD  niacinamide 100 MG tablet Take 100 mg by mouth 2 (two) times daily.    [provider]  ondansetron (ZOFRAN) 4 MG tablet Take 1 tablet (4 mg total) by mouth every 6 (six) hours as needed for nausea. Patient not taking: Reported on 01/12/2021 12/11/20   Cristal Deer, MD  ONE TOUCH ULTRA TEST test strip USE AS INSTRUCTED    Weber, Damaris Hippo, PA-C  potassium chloride SA (KLOR-CON) 20 MEQ tablet Take 1 tablet (20 mEq total) by mouth 2 (two) times daily. 01/11/20   Daleen Squibb, MD  Probiotic Product (PROBIOTIC PO) Take 1 tablet at bedtime by mouth.     [provider]  sertraline (ZOLOFT) 100 MG tablet Take 200 mg daily with breakfast by mouth.  03/16/17   [provider]  tamsulosin (FLOMAX) 0.4 MG CAPS capsule Take 0.4 mg by mouth at bedtime as needed (blood in urine). Patient not taking: Reported on 01/12/2021 11/17/20   [provider]  traZODone (DESYREL) 150 MG tablet Take 150 mg by mouth at bedtime.    [provider]  vitamin C (ASCORBIC ACID) 500 MG tablet Take 500 mg by mouth daily. Patient not taking: Reported on 01/12/2021    [provider]  XIFAXAN 550 MG TABS tablet TAKE 1 TABLET TWICE A DAY Patient taking differently: Take 550 mg by mouth 2 (two) times daily. 10/02/20   Ladene Artist, MD    Allergies    Azithromycin, Beef-derived products, Milk-related compounds, Penicillins, Sulfonamide derivatives,  and Dilaudid [hydromorphone hcl]  Review of Systems   Review of Systems  Constitutional: Negative.   HENT: Negative.    Respiratory: Negative.    Cardiovascular: Negative.   Gastrointestinal: Negative.   Genitourinary: Negative.   Musculoskeletal:  Positive for neck pain.       Right shoulder pain  Skin:  Positive for wound.  Neurological:  Positive for headaches.  All other systems reviewed and are negative.  Physical Exam Updated Vital Signs BP 134/80   Pulse 79   Temp 98.4 F (36.9 C) (Oral)   Resp 18   Ht 5' 11"  (1.803 m)   Wt 81.6 kg   SpO2 97%   BMI 25.10 kg/m   Physical Exam Vitals and nursing note reviewed.  Constitutional:      General: He is not in acute distress.    Appearance: He is well-developed. He is not ill-appearing, toxic-appearing or diaphoretic.  HENT:     Head: Normocephalic.     Comments: Large abrasion to scalp. Jagged laceration to frontal scalp. No active bleeding. No hematoma.    Nose: Nose normal.     Mouth/Throat:     Mouth: Mucous membranes are moist.  Eyes:  Pupils: Pupils are equal, round, and reactive to light.  Cardiovascular:     Rate and Rhythm: Normal rate and regular rhythm.     Pulses: Normal pulses.     Heart sounds: Normal heart sounds.  Pulmonary:     Effort: Pulmonary effort is normal. No respiratory distress.     Breath sounds: Normal breath sounds.  Abdominal:     General: Bowel sounds are normal. There is no distension.     Palpations: Abdomen is soft.     Tenderness: There is no abdominal tenderness.  Musculoskeletal:        General: Normal range of motion.     Cervical back: Normal range of motion and neck supple.     Comments: Moves all 4 extremities without difficulty.  Mild tenderness to anterior right shoulder, has known rotator cuff tear.  No bony tenderness to bilateral forearms, pelvis, lower extremities.  No shortening rotation of legs.  Skin:    General: Skin is warm and dry.     Capillary Refill:  Capillary refill takes less than 2 seconds.  Neurological:     General: No focal deficit present.     Mental Status: He is alert and oriented to person, place, and time.     Cranial Nerves: Cranial nerves are intact.     Motor: Motor function is intact.     Comments: CN 2-12 grossly intact Intact sensation Equal strength bilaterally    ED Results / Procedures / Treatments   Labs (all labs ordered are listed, but only abnormal results are displayed) Labs Reviewed  CBC WITH DIFFERENTIAL/PLATELET - Abnormal; Notable for the following components:      Result Value   WBC 2.8 (*)    RBC 2.93 (*)    Hemoglobin 10.7 (*)    HCT 31.4 (*)    MCV 107.2 (*)    MCH 36.5 (*)    All other components within normal limits  COMPREHENSIVE METABOLIC PANEL - Abnormal; Notable for the following components:   Glucose, Bld 124 (*)    Total Protein 6.1 (*)    AST 42 (*)    Total Bilirubin 1.4 (*)    All other components within normal limits  LAMOTRIGINE LEVEL    EKG EKG Interpretation  Date/Time:  Sunday January 14 2021 22:01:03 EDT Ventricular Rate:  79 PR Interval:  189 QRS Duration: 96 QT Interval:  400 QTC Calculation: 459 R Axis:   -5 Text Interpretation: Sinus rhythm No significant change since last tracing Confirmed by Isla Pence [phone]) on 01/14/2021 10:15:20 PM  Radiology DG Chest 1 View  Result Date: 01/14/2021 CLINICAL DATA:  Patient had a seizure, passed out, and fell on the concrete. Pain in the neck. EXAM: CHEST  1 VIEW COMPARISON:  12/04/2020 FINDINGS: Heart size and pulmonary vascularity are normal. Lungs are clear. No pleural effusions. No pneumothorax. Right PICC line with tip over the upper SVC region. Previous resection or resorption of the distal right clavicle. Calcification of the aorta. IMPRESSION: No active disease. Electronically Signed   By: Lucienne Capers M.D.   On: 01/14/2021 21:50   DG Pelvis 1-2 Views  Result Date: 01/14/2021 CLINICAL DATA:  Syncope and  seizure.  Fall. EXAM: PELVIS - 1-2 VIEW COMPARISON:  None. FINDINGS: Degenerative changes in the lower lumbar spine and hips. Pelvis appears intact. No evidence of acute fracture or dislocation. No focal bone lesion or bone destruction. Soft tissues are unremarkable. IMPRESSION: Degenerative changes in the hips. No acute displaced fractures  identified. Electronically Signed   By: Lucienne Capers M.D.   On: 01/14/2021 21:54   DG Shoulder Right  Result Date: 01/14/2021 CLINICAL DATA:  Syncope and seizure. EXAM: RIGHT SHOULDER - 2+ VIEW COMPARISON:  10/28/2018 FINDINGS: Previous resection or resorption of the distal right clavicle. Old appearing fracture deformities of the scapula and several right ribs. No definite acute fracture or dislocation. No destructive or expansile bone lesions. Soft tissues are unremarkable. IMPRESSION: No acute fracture or dislocation identified. Old fracture deformities of the scapula and right ribs. Previous resection or resorption of the distal right clavicle. Electronically Signed   By: Lucienne Capers M.D.   On: 01/14/2021 21:52   CT Head Wo Contrast  Result Date: 01/14/2021 CLINICAL DATA:  Facial trauma; Head trauma, minor (Age >= 65y). Loss of consciousness. Head trauma. EXAM: CT HEAD WITHOUT CONTRAST CT CERVICAL SPINE WITHOUT CONTRAST TECHNIQUE: Multidetector CT imaging of the head and cervical spine was performed following the standard protocol without intravenous contrast. Multiplanar CT image reconstructions of the cervical spine were also generated. COMPARISON:  None. FINDINGS: CT HEAD FINDINGS Brain: There is no mass, hemorrhage or extra-axial collection. The size and configuration of the ventricles and extra-axial CSF spaces are normal. The brain parenchyma is normal, without evidence of acute or chronic infarction. Vascular: No abnormal hyperdensity of the major intracranial arteries or dural venous sinuses. No intracranial atherosclerosis. Skull: The visualized  skull base, calvarium and extracranial soft tissues are normal. Sinuses/Orbits: No fluid levels or advanced mucosal thickening of the visualized paranasal sinuses. No mastoid or middle ear effusion. The orbits are normal. CT CERVICAL SPINE FINDINGS Alignment: No static subluxation. Facets are aligned. Occipital condyles are normally positioned. Skull base and vertebrae: There is an acute, minimally displaced fracture at C6 that extends through the right lamina and pedicle. Soft tissues and spinal canal: No prevertebral fluid or swelling. No visible canal hematoma. Disc levels: No advanced spinal canal or neural foraminal stenosis. Upper chest: No pneumothorax, pulmonary nodule or pleural effusion. Other: Normal visualized paraspinal cervical soft tissues. IMPRESSION: 1. No acute intracranial abnormality. 2. Acute, minimally displaced fracture at C6 that extends through the right lamina and pedicle. No static subluxation. This is consistent with a bony posterior tension band injury (AO Spine B1). MRI recommended to assess for ligamentous injury. Critical Value/emergent results were called by telephone at the time of interpretation on 01/14/2021 at 10:26 pm to provider [facility] , who verbally acknowledged these results. Electronically Signed   By: Ulyses Jarred M.D.   On: 01/14/2021 22:26   CT Cervical Spine Wo Contrast  Result Date: 01/14/2021 CLINICAL DATA:  Facial trauma; Head trauma, minor (Age >= 65y). Loss of consciousness. Head trauma. EXAM: CT HEAD WITHOUT CONTRAST CT CERVICAL SPINE WITHOUT CONTRAST TECHNIQUE: Multidetector CT imaging of the head and cervical spine was performed following the standard protocol without intravenous contrast. Multiplanar CT image reconstructions of the cervical spine were also generated. COMPARISON:  None. FINDINGS: CT HEAD FINDINGS Brain: There is no mass, hemorrhage or extra-axial collection. The size and configuration of the ventricles and extra-axial CSF spaces are  normal. The brain parenchyma is normal, without evidence of acute or chronic infarction. Vascular: No abnormal hyperdensity of the major intracranial arteries or dural venous sinuses. No intracranial atherosclerosis. Skull: The visualized skull base, calvarium and extracranial soft tissues are normal. Sinuses/Orbits: No fluid levels or advanced mucosal thickening of the visualized paranasal sinuses. No mastoid or middle ear effusion. The orbits are normal. CT CERVICAL SPINE FINDINGS  Alignment: No static subluxation. Facets are aligned. Occipital condyles are normally positioned. Skull base and vertebrae: There is an acute, minimally displaced fracture at C6 that extends through the right lamina and pedicle. Soft tissues and spinal canal: No prevertebral fluid or swelling. No visible canal hematoma. Disc levels: No advanced spinal canal or neural foraminal stenosis. Upper chest: No pneumothorax, pulmonary nodule or pleural effusion. Other: Normal visualized paraspinal cervical soft tissues. IMPRESSION: 1. No acute intracranial abnormality. 2. Acute, minimally displaced fracture at C6 that extends through the right lamina and pedicle. No static subluxation. This is consistent with a bony posterior tension band injury (AO Spine B1). MRI recommended to assess for ligamentous injury. Critical Value/emergent results were called by telephone at the time of interpretation on 01/14/2021 at 10:26 pm to provider [facility] , who verbally acknowledged these results. Electronically Signed   By: Ulyses Jarred M.D.   On: 01/14/2021 22:26    Procedures .Marland KitchenLaceration Repair  Date/Time: 01/14/2021 10:41 PM Performed by: Shelby Dubin A, PA-C Authorized by: Nettie Elm, PA-C   Consent:    Consent obtained:  Verbal   Consent given by:  Patient   Risks, benefits, and alternatives were discussed: yes     Risks discussed:  Infection, need for additional repair, pain, poor cosmetic result, poor wound healing and  nerve damage   Alternatives discussed:  No treatment, delayed treatment, observation and referral Universal protocol:    Procedure explained and questions answered to patient or proxy's satisfaction: yes     Relevant documents present and verified: yes     Test results available: yes     Imaging studies available: yes     Required blood products, implants, devices, and special equipment available: yes     Site/side marked: yes     Immediately prior to procedure, a time out was called: yes     Patient identity confirmed:  Verbally with patient Anesthesia:    Anesthesia method:  Local infiltration   Local anesthetic:  Lidocaine 1% WITH epi Laceration details:    Location:  Scalp   Scalp location:  Frontal   Length (cm):  4 Pre-procedure details:    Preparation:  Patient was prepped and draped in usual sterile fashion and imaging obtained to evaluate for foreign bodies Exploration:    Limited defect created (wound extended): no     Hemostasis achieved with:  Direct pressure   Imaging obtained comment:  CT head   Imaging outcome: foreign body not noted     Wound exploration: wound explored through full range of motion and entire depth of wound visualized     Contaminated: no   Treatment:    Area cleansed with:  Povidone-iodine   Amount of cleaning:  Extensive   Irrigation solution:  Sterile saline Skin repair:    Repair method:  Staples   Number of staples:  4 Approximation:    Approximation:  Close Repair type:    Repair type:  Intermediate Post-procedure details:    Dressing:  Bulky dressing   Procedure completion:  Tolerated well, no immediate complications   Medications Ordered in ED Medications  lidocaine-EPINEPHrine (XYLOCAINE W/EPI) 2 %-1:200000 (PF) injection 10 mL (has no administration in time range)  LORazepam (ATIVAN) injection 1 mg (has no administration in time range)  oxyCODONE-acetaminophen (PERCOCET/ROXICET) 5-325 MG per tablet 1 tablet (1 tablet Oral Given  01/14/21 2212)  morphine 4 MG/ML injection 4 mg (4 mg Intravenous Given 01/14/21 2325)   ED Course  I have  reviewed the triage vital signs and the nursing notes.  Pertinent labs & imaging results that were available during my care of the patient were reviewed by me and considered in my medical decision making (see chart for details).  Here for evaluation of fall, unclear if LOC.  Has known seizure disorder has been compliant with his meds.  Afebrile, nonseptic, not ill-appearing.  Apparently no postictal period.  He is neurovascularly intact.  Heart lungs clear abdomen soft, nontender.  Does have a jagged laceration to scalp with large abrasion.  Mild neck tenderness, in c-collar, no midline thoracic lumbar.  Full range of motion bilateral extremities.  Plan on labs, imaging and reassess  Labs and imaging personally reviewed and interpreted:  CBC leukopenia at 2.8 similar to prior CMP glucose at 124, tbili 1.4 similar to prior EKG without ischemia CT head without acute abnormality CT cervical C6 fracture, rec MRI Dg pelvis without acute abnormality DG chest without acute abnormality  Dg right shoulder without acute abnormality   CONSULT with Dr. Ellene Route, rec hard collar, MR. Can reconsult if any significant abnormality on MR, otherwise Fu in office in 1 week.  Care transferred to [facility] who will FU on imaging and dispo     MDM Rules/Calculators/A&P                            Final Clinical Impression(s) / ED Diagnoses Final diagnoses:  Fall, initial encounter  Closed nondisplaced fracture of sixth cervical vertebra, unspecified fracture morphology, initial encounter [facility])  Laceration of scalp, initial encounter    Rx / DC Orders ED Discharge Orders     None        Zariyah Stephens A, PA-C 01/14/21 2330    Isla Pence, MD 01/22/21 [phone]

## 2021-01-14 NOTE — ED Triage Notes (Signed)
Pt bib EMS due to a fall. Pt was walking on [address] when he suddenly started to feel weak. Called wife and lost consciousness soon after. Pt fell and hit his head on concrete. Small head lac to top of head. Substantial bleeding noted on scene, pt has a clotting disorder which causes low hematocrit and platelets. 400-500cc estimated blood loss.  Vitals: 148/89 110 HR O2: 98% CBG: 118

## 2021-01-15 ENCOUNTER — Emergency Department (HOSPITAL_COMMUNITY): Payer: 59

## 2021-01-15 NOTE — ED Notes (Signed)
Patient transported to MRI 

## 2021-01-15 NOTE — ED Provider Notes (Signed)
Neuro intact.  Moves all extremities.  In hard collar.  F/u with neurosurg.     Montine Circle, PA-C 01/15/21 [id number]    Ripley Fraise, MD 01/15/21 [phone]

## 2021-01-16 LAB — LAMOTRIGINE LEVEL: Lamotrigine Lvl: 8.9 ug/mL (ref 2.0–20.0)

## 2021-01-18 ENCOUNTER — Telehealth: Payer: Self-pay

## 2021-01-18 NOTE — Telephone Encounter (Signed)
Received call from [place] to report critical lab values for patient. Reported WBC 2.3. Results faxed to office.   Forwarding to provider.  Ariba Lehnen Lorita Officer, RN

## 2021-01-24 ENCOUNTER — Encounter: Payer: Self-pay | Admitting: Internal Medicine

## 2021-01-30 ENCOUNTER — Other Ambulatory Visit: Payer: Self-pay

## 2021-01-30 ENCOUNTER — Encounter: Payer: Self-pay | Admitting: Physical Therapy

## 2021-01-30 ENCOUNTER — Ambulatory Visit: Payer: 59 | Attending: Family Medicine | Admitting: Physical Therapy

## 2021-01-30 DIAGNOSIS — R296 Repeated falls: Secondary | ICD-10-CM

## 2021-01-30 DIAGNOSIS — R279 Unspecified lack of coordination: Secondary | ICD-10-CM

## 2021-01-30 DIAGNOSIS — M6281 Muscle weakness (generalized): Secondary | ICD-10-CM | POA: Diagnosis present

## 2021-01-30 DIAGNOSIS — R2689 Other abnormalities of gait and mobility: Secondary | ICD-10-CM

## 2021-01-30 NOTE — Therapy (Signed)
[facility] [address] [place], [place], [address] Phone: [phone]   Fax:  [phone]  Physical Therapy Evaluation  Patient Details  Name: [name] [name] MRN: [id number] Date of Birth: Mar 14, 1961 Referring Provider (PT): [name], MD   Encounter Date: 01/30/2021   PT End of Session - 01/30/21 1319     Visit Number 1    Date for PT Re-Evaluation 03/27/21    Authorization Type [employer] Medicare    Authorization - Visit Number 1    Authorization - Number of Visits 60    PT Start Time 6294    PT Stop Time 1313    PT Time Calculation (min) 43 min    Equipment Utilized During Treatment Gait belt    Activity Tolerance Patient tolerated treatment well;Other (comment)   limited by dizziness   Behavior During Therapy [facility] for tasks assessed/performed             Past Medical History:  Diagnosis Date   Adenomatous colon polyp 10/1999   Anxiety    Arthritis    neck, yoga helps.   Cataract    Complex partial seizure ([place])    last seizure was 08-30-2018   Crohn's disease of small and large intestines ([place]) 1999   Depression    Diabetes mellitus    no meds at this time 10-24-17   Esophageal stricture    External hemorrhoids    GERD (gastroesophageal reflux disease)    Glaucoma    History of kidney stones    3 large stones still present   Hyperlipemia    Hypertension    resolved with weight loss    Inguinal hernia, bilateral 12/2019   Liver cirrhosis secondary to NASH ([place])    Migraines    Osteoporosis    PONV (postoperative nausea and vomiting)    PTSD (post-traumatic stress disorder)    Renal cyst, acquired, left 07/08/2017   Skin cancer    squamous cell multiple; [place]; followed every 3 months.   Sleep apnea    CPAP machine, uses nightly   Staphylococcus aureus bacteremia with sepsis ([place]) 05/28/2017   MRSA 2012    Past Surgical History:  Procedure Laterality Date   CATARACT EXTRACTION  Bilateral    COLONOSCOPY     DEBRIDEMENT MANDIBLE N/A 12/07/2020   Procedure: DEBRIDEMENT OF MANDIBLE;  Surgeon: [name], DMD;  Location: [place];  Service: Oral Surgery;  Laterality: N/A;   ELBOW SURGERY  2012   elbow MRSA infection    EYE SURGERY     cataracts removed, /w IOL   GROSS TEETH DEBRIDEMENT N/A 12/29/2020   Procedure: MANDIBULAR  DEBRIDEMENT;  Surgeon: [name], DMD;  Location: [place];  Service: Dentistry;  Laterality: N/A;   INCISION AND DRAINAGE ABSCESS N/A 12/07/2020   Procedure: INCISION AND DRAINAGE MANDIBULAR ABSCESS;  Surgeon: [name], DMD;  Location: [place];  Service: Oral Surgery;  Laterality: N/A;   INGUINAL HERNIA REPAIR Right 01/14/2020   Procedure: OPEN RIGHT HERNIA REPAIR INGUINAL WITH MESH;  Surgeon: [name], MD;  Location: [facility];  Service: General;  Laterality: Right;   INSERTION OF MESH N/A 07/01/2016   Procedure: INSERTION OF MESH;  Surgeon: [name], MD;  Location: [place];  Service: General;  Laterality: N/A;   IR URETERAL STENT LEFT NEW ACCESS W/O SEP NEPHROSTOMY CATH  02/02/2018   NEPHROLITHOTOMY Left 02/02/2018   Procedure: NEPHROLITHOTOMY PERCUTANEOUS;  Surgeon: [name], MD;  Location:  [facility];  Service: Urology;  Laterality: Left;   POLYPECTOMY     SCALP LACERATION REPAIR Right 10/16/2017   From fall/staples   SHOULDER ARTHROSCOPY Right 03/05/2018   Procedure: ARTHROSCOPY SHOULDER AND OPEN DISTAL CLAVICLE EXCISION;  Surgeon: [name], MD;  Location: [place];  Service: Orthopedics;  Laterality: Right;   SHOULDER SURGERY     SINUS SURGERY WITH INSTATRAK     TEE WITHOUT CARDIOVERSION N/A 05/09/2017   Procedure: TRANSESOPHAGEAL ECHOCARDIOGRAM (TEE);  Surgeon: [name], MD;  Location: [facility];  Service: Cardiovascular;  Laterality: N/A;   TOOTH EXTRACTION N/A 12/07/2020   Procedure: DENTAL EXTRACTIONS OF TEETH TWENTY TO THIRTY;  Surgeon: [name], DMD;  Location: [place];  Service: Oral  Surgery;  Laterality: N/A;   TOOTH EXTRACTION N/A 12/29/2020   Procedure: DENTAL RESTORATION/EXTRACTIONS;  Surgeon: [name], DMD;  Location: [place];  Service: Dentistry;  Laterality: N/A;   TRANSTHORACIC ECHOCARDIOGRAM  02/22/2011   EF 55-65%; increased pattern of LVH with mild conc hypertrophy, abnormal relaxation & increased filling pressure (grade 2 diastolic dysfunction); atrial septum thickened (lipomatous hypertrophy)   UMBILICAL HERNIA REPAIR N/A 07/01/2016   Procedure: UMBILICAL HERNIA REPAIR WITH MESH;  Surgeon: [name], MD;  Location: [place];  Service: General;  Laterality: N/A;   UPPER GASTROINTESTINAL ENDOSCOPY     VASECTOMY      There were no vitals filed for this visit.    Subjective Assessment - 01/30/21 1235     Subjective Pt with history of frequent falls and signif complex PMH (see pertient history below). Recent fall in 12/2020 with LOC, C6 fracture and ligamentum flavum tear - non-surgical.  Many past falls with fractures secondary to osteoporosis.  History of chronic neck pain with [name].    Patient is accompained by: Family member   Pt's wife provided history, Pt has memory problems   Pertinent History on disability, has memory problems, liver cirrhosis, Chrons, osteoporosis with multiple fractures including C6, thrombocytopenia, gluacoma (loss of peripheral vision).  Has lost 75lb through walking but concerned about safety    Limitations Walking    How long can you walk comfortably? 20 min    Diagnostic tests C6 fracture and ligamentum flavum tear - non-surgical    Patient Stated Goals work on coming down and up stairs, balance    Currently in Pain? Yes    Pain Score 7     Pain Location Neck    Pain Orientation Posterior;Right;Left    Pain Descriptors / Indicators Aching    Pain Radiating Towards Rt shoulder    Pain Onset More than a month ago    Pain Frequency Constant    Effect of Pain on Daily Activities stairs, falls frequently                 [facility] PT Assessment - 01/30/21 0001       Assessment   Medical Diagnosis R29.6 (ICD-10-CM) - Repeated falls    Referring Provider (PT) [name], MD    Hand Dominance Right    Next MD Visit Oct      Precautions   Precautions Fall      Balance Screen   Has the patient fallen in the past 6 months Yes    How many times? 10-20    Has the patient had a decrease in activity level because of a fear of falling?  No    Is the patient reluctant to leave their home because of a fear  of falling?  No      Home Social worker Private residence    Living Arrangements Spouse/significant other;Children    Type of [place] to enter    Entrance Stairs-Number of Steps 2    [place] Two level;Bed/bath upstairs    Home Equipment Walker - 2 wheels;Grab bars - tub/shower;Toilet riser;Grab bars - toilet;Tub bench;Kasandra Knudsen - single point      Prior Function   Level of Independence Independent with household mobility without device;Independent with community mobility without device;Independent;Other (comment)   ind but has started falling a lot     Cognition   Overall Cognitive Status History of cognitive impairments - at baseline    Memory Impaired    Memory Impairment --   short term memory deficit     Posture/Postural Control   Posture/Postural Control Postural limitations    Postural Limitations Decreased lumbar lordosis      ROM / Strength   AROM / PROM / Strength Strength      Strength   Overall Strength Comments 5/5 bil LEs      Transfers   Transfers Independent with all Transfers      Ambulation/Gait   Ambulation/Gait Yes    Ambulation/Gait Assistance 5: Supervision;4: Min guard    Ambulation/Gait Assistance Details 3' walk test    Ambulation Distance (Feet) 480 Feet    Assistive device None    Gait Pattern Step-through pattern;Decreased arm swing - right;Decreased arm swing - left    Gait  Comments Pt had multiple balance losses and reports of dizziness, fast pace and 90 deg turns, gait belt used with supervision and intermittent CGA      Standardized Balance Assessment   Standardized Balance Assessment Timed Up and Go Test;Five Times Sit to Stand    Five times sit to stand comments  17 sec, no UE use      Timed Up and Go Test   TUG Normal TUG    Normal TUG (seconds) 13                        Objective measurements completed on examination: See above findings.       [facility] Adult PT Treatment/Exercise - 01/30/21 0001       Self-Care   Self-Care Other Self-Care Comments    Other Self-Care Comments  gait safety (slow down and take more frequent shorter duration walks, possible use of walker for safety), stair negotiation recommend step to for descending for safety                      PT Short Term Goals - 01/30/21 1338       PT SHORT TERM GOAL #1   Title Pt will participate in FGA and 6' walk test with PT setting LTGs    Time 1    Period Weeks    Status New    Target Date 02/06/21      PT SHORT TERM GOAL #2   Title Pt will verbalize and demo safe strategies for stair pattern (step to for descending) and gait patterns/pace (slow down) without AD    Time 2    Period Weeks    Status New    Target Date 02/13/21      PT SHORT TERM GOAL #3   Title PT and Pt will explore gait with walker and make  decisions about appropriateness of use for improved safety.    Time 2    Period Weeks    Status New    Target Date 02/13/21      PT SHORT TERM GOAL #4   Title Pt ind with initial HEP    Time 4    Period Weeks    Status New    Target Date 02/27/21               PT Long Term Goals - 01/30/21 1341       PT LONG TERM GOAL #1   Title Improve 5x sit to stand to </= 14 sec.    Baseline 17    Time 8    Period Weeks    Status New    Target Date 03/27/21      PT LONG TERM GOAL #2   Title Pt will demo up/down stairs x 3 rounds (4  6 inch steps) using bil UEs and safe strategy to demo improved safety on stairs    Time 8    Period Weeks    Status New    Target Date 03/27/21      PT LONG TERM GOAL #3   Title Pt will demo safety/no LOB with community ambulation for 3' walk test, curb up/down and grassy surface with supervision using least restrictive AD or no AD.    Time 8    Period Weeks    Status New    Target Date 03/27/21      PT LONG TERM GOAL #4   Title Pt will report at least 50% more confidence in balance with daily tasks    Time 8    Period Weeks    Status New    Target Date 03/27/21      PT LONG TERM GOAL #5   Title Ind with advanced HEP and understand how to safely progress    Time 8    Period Weeks    Status New    Target Date 03/27/21                    Plan - 01/30/21 1319     Clinical Impression Statement Pt is a 60yo pleasant male referred to PT for frequent falls.  Pt is accompanied by his wife who helped provide history due to Pt's memory deficits.  Pt is on disability and has complex medical history including seizures, liver cirrhosis, Chron's, osteoporosis with Hx of multiple fractures from falls, [name] with chronic neck pain, thrombocyctopenia, and C6 fracture from most recent fall 2 weeks ago when he sustained LOC and head laceration.  Pt also has Rt shoulder pain since fall.  Neck fracture was non-surgical.  Pt has both vision (peripheral) and hearing deficits.  Pt has fallen 10-20 times in last 6 months.  He currently does not use an AD and walks for exercise, losing 75lb through exercise since on disability.  He lives in a 2-story home with upstairs living.  He is ind with all transfers, self-care, household and community ambulation although not always safely ind, per history provided.  Pt has at least 4+/5 strength of bil LEs.  He ambulated 29' with 3' walk test today with fast-paced gait and 90 deg turns during which he had several LOB, reporting dizziness.  Pt ambulates with  step through pattern and long stride length bil with upper body rigidity and lack of trunk rotation and arm swing.  Pt was at times unsafe with  gait speed used.  5x sit to stand is 17 sec and TUG is 13 sec, indicating fall risk.  Pt uses step over step pattern for ascending and descending stairs with 2 rails but is very cautious and unsteady descending stairs secondary to eccentric quad control deficits.  PT discussed with Pt and his wife modifications to walking program (more frequent, shorter duration), possible use of walker for safety although Pt hesitant to use (Pt's wife reports he cannot use a cane), and recommended using step to pattern for descending stairs (Pt has fallen down stairs multiple times).  PT ran out of time to do FGA and assess community ambulation so these are recommended for next visit.  Pt will benefit from skilled PT to address deficits and maximize safety both in home and community.    Personal Factors and Comorbidities Comorbidity 1;Comorbidity 2;Comorbidity 3+;Behavior Pattern;Time since onset of injury/illness/exacerbation    Comorbidities seizures, memory deficits, osteoporosis with Hx of multiple fractures    Examination-Activity Limitations Locomotion Level;Transfers;Stand;Bend    Examination-Participation Restrictions Community Activity;Driving    Stability/Clinical Decision Making Evolving/Moderate complexity    Clinical Decision Making Moderate    Rehab Potential Good    PT Frequency 2x / week    PT Duration 8 weeks    PT Treatment/Interventions ADLs/Self Care Home Management;Gait training;Stair training;Functional mobility training;Therapeutic activities;Therapeutic exercise;Balance training;Neuromuscular re-education;Manual techniques;Patient/family education;Passive range of motion    PT Next Visit Plan do FGA with gait belt and set LTG, 6' walk test outside w/ gait belt no AD if not too hot (Pt got dizzy with indoor 3' walk test at eval), assess walker height and  gait with/without walker    PT Home Exercise Plan start balance focused HEP with UE support    Consulted and Agree with Plan of Care Patient;Family member/caregiver    Family Member Consulted Pt's wife             Patient will benefit from skilled therapeutic intervention in order to improve the following deficits and impairments:  Decreased coordination, Decreased mobility, Postural dysfunction, Pain, Decreased safety awareness, Impaired perceived functional ability, Decreased balance, Decreased cognition, Impaired flexibility, Dizziness  Visit Diagnosis: Repeated falls - Plan: PT plan of care cert/re-cert  Unspecified lack of coordination - Plan: PT plan of care cert/re-cert  Other abnormalities of gait and mobility - Plan: PT plan of care cert/re-cert     Problem List Patient Active Problem List   Diagnosis Date Noted   Liver cirrhosis secondary to NASH ([place])    GERD (gastroesophageal reflux disease)    Pancytopenia ([facility])    Osteomyelitis of jaw 12/29/2020   Acute osteomyelitis of mandible 12/26/2020   S/P inguinal hernia repair 01/14/2020   At high risk for falls    S/P shoulder surgery 03/05/2018   Excessive daytime sleepiness 02/18/2018   Crohn's disease of colon with rectal bleeding ([place]) 02/18/2018   Blood in stool 02/18/2018   Iron deficiency anemia due to chronic blood loss 02/18/2018   Iron deficiency anemia due to sideropenic dysphagia 02/18/2018   Renal calculus, left 02/02/2018   History of sepsis 12/16/2017   Renal cyst, acquired, left 07/08/2017   Frequent falls 07/08/2017   Renal lesion 05/28/2017   MSSA bacteremia 05/06/2017   Confusion 04/02/2017   Syncope 08/30/2016   Depression 04/30/2016   Memory loss 04/30/2016   OSA on CPAP 10/31/2015   Nonepileptic episode ([place]) 04/25/2015   Seizure ([place]) 04/04/2013   Abnormal EKG 03/12/2013   DOE (dyspnea on exertion) 03/12/2013  Complex partial seizure ([place]) 04/19/2012   DM2 (diabetes mellitus, type  2) ([place]) 09/25/2011   OTHER DYSPHAGIA 03/27/2009   Cough 11/10/2008   GERD 12/07/2007   COLONIC POLYPS, ADENOMATOUS, HX OF 12/07/2007   EXTERNAL HEMORRHOIDS 09/11/2007   CROHN'S DISEASE, LARGE AND SMALL INTESTINES 09/11/2007   OSTEOPOROSIS 09/11/2007   HYPERLIPIDEMIA NEC/NOS 02/27/2007   Essential hypertension 02/27/2007    Baruch Merl, PT 01/30/21 1:47 PM   [facility] [address] [place], [place], [address] Phone: [phone]   Fax:  [phone]  Name: [name] [name] MRN: [id number] Date of Birth: [birth year]

## 2021-01-31 ENCOUNTER — Encounter: Payer: Self-pay | Admitting: Gastroenterology

## 2021-01-31 ENCOUNTER — Ambulatory Visit (INDEPENDENT_AMBULATORY_CARE_PROVIDER_SITE_OTHER): Payer: 59 | Admitting: Gastroenterology

## 2021-01-31 VITALS — BP 104/52 | HR 75 | Ht 71.0 in | Wt 177.6 lb

## 2021-01-31 DIAGNOSIS — R197 Diarrhea, unspecified: Secondary | ICD-10-CM

## 2021-01-31 DIAGNOSIS — K7460 Unspecified cirrhosis of liver: Secondary | ICD-10-CM | POA: Diagnosis not present

## 2021-01-31 DIAGNOSIS — K5080 Crohn's disease of both small and large intestine without complications: Secondary | ICD-10-CM | POA: Diagnosis not present

## 2021-01-31 MED ORDER — ESOMEPRAZOLE MAGNESIUM 40 MG PO CPDR: 40.0000 mg | DELAYED_RELEASE_CAPSULE | Freq: Two times a day (BID) | ORAL | 3 refills | Status: DC

## 2021-01-31 MED ORDER — MESALAMINE 1.2 G PO TBEC: DELAYED_RELEASE_TABLET | ORAL | 3 refills | Status: DC

## 2021-01-31 MED ORDER — METHSCOPOLAMINE BROMIDE 5 MG PO TABS: 5.0000 mg | ORAL_TABLET | Freq: Two times a day (BID) | ORAL | 3 refills | Status: DC

## 2021-01-31 MED ORDER — MERCAPTOPURINE 50 MG PO TABS: ORAL_TABLET | ORAL | 3 refills | Status: DC

## 2021-01-31 MED ORDER — RIFAXIMIN 550 MG PO TABS
550.0000 mg | ORAL_TABLET | Freq: Two times a day (BID) | ORAL | 3 refills | Status: DC
Start: 2021-01-31 — End: 2021-06-04

## 2021-01-31 NOTE — Patient Instructions (Signed)
We have sent the following medications to your pharmacy for you to pick up at your convenience: Nexium, mercaptopurine, mesalamine, pamine forte, and xifaxan.    Normal BMI (Body Mass Index- based on height and weight) is between 19 and 25. Your BMI today is Body mass index is 24.77 kg/m. Marland Kitchen Please consider follow up  regarding your BMI with your Primary Care Provider.  Due to recent changes in healthcare laws, you may see the results of your imaging and laboratory studies on MyChart before your provider has had a chance to review them.  We understand that in some cases there may be results that are confusing or concerning to you. Not all laboratory results come back in the same time frame and the provider may be waiting for multiple results in order to interpret others.  Please give [place] 48 hours in order for your provider to thoroughly review all the results before contacting the office for clarification of your results.   Thank you for choosing me and [facility].  Pricilla Riffle. Dagoberto Ligas., MD., Marval Regal

## 2021-01-31 NOTE — Progress Notes (Signed)
    History of Present Illness: This is a 60 year old male returning for follow-up of Crohn's ileocolitis and cirrhosis.  He is accompanied by his wife he has recently been treated for osteomyelitis of the right mandible with surgical debridement, multiple dental extractions and a course of intravenous antibiotics.  He is now completing his antibiotic regimen with Augmentin.  He is noted an increase in generalized abdominal pain and diarrhea since starting on antibiotics.  His symptoms are not severe.  His wife raises concerns about his CBC abnormalities.  The majority of the abnormalities appear to be related to his cirrhosis however his platelet count has dropped substantially over the past month.  Current Medications, Allergies, Past Medical History, Past Surgical History, Family History and Social History were reviewed in [employer] record.   Physical Exam: General: Well developed, well nourished, no acute distress Head: Normocephalic and atraumatic Eyes: Sclerae anicteric, EOMI Ears: Normal auditory acuity Mouth: Not examined, mask on during Covid-19 pandemic Lungs: Clear throughout to auscultation Heart: Regular rate and rhythm; no murmurs, rubs or bruits Abdomen: Soft, non tender and non distended. No masses, hepatosplenomegaly or hernias noted. Normal Bowel sounds Rectal: Not done Musculoskeletal: Symmetrical with no gross deformities  Pulses:  Normal pulses noted Extremities: No clubbing, cyanosis, edema or deformities noted Neurological: Alert oriented x 4, grossly nonfocal Psychological:  Alert and cooperative. Normal mood and affect   Assessment and Recommendations:  1. Crohn's ileocolitis and IBS-D.  Increased abdominal pain and diarrhea likely due to antibiotics.  Continue daily probiotic.  If increased abdominal pain and diarrhea persist following completion of antibiotics or worsen while completing antibiotics will obtain C. difficile.  Continue to  avoid foods that trigger symptoms.  Continue 6-MP 100 mg qd, Lialda 2.4 qd, Pamine Forte 5 mg p.o. twice daily and Imodium 3 times daily as needed.  REV in 2 months.    2.  Cirrhosis presumed secondary to NASH.  Worsening thrombocytopenia.  He has blood work scheduled with his PCP will obtain those results when available.  Mild HE appears to be controlled.  RUQ [place] results from July reviewed.  Continue Xifaxan 550 mg po bid. Hep A & B vaccination was completed. No more than 2g Tylenol daily. Continue to completely avoid etoh. REV in 2 months.     3. GERD. Continue Nexium 40 mg po bid. Follow antireflux measures.   4. Personal history of adenomatous colon polyps. Surveillance colonoscopy in 10/2023.  5.  Osteomyelitis of the right mandible status post debridement and dental extractions.  Completing a course of antibiotics with Augmentin.  Follow-up as planned with his oral surgeon and PCP.

## 2021-02-02 DIAGNOSIS — G5623 Lesion of ulnar nerve, bilateral upper limbs: Secondary | ICD-10-CM | POA: Diagnosis not present

## 2021-02-02 DIAGNOSIS — G5603 Carpal tunnel syndrome, bilateral upper limbs: Secondary | ICD-10-CM | POA: Diagnosis not present

## 2021-02-07 ENCOUNTER — Ambulatory Visit: Payer: 59 | Admitting: Physical Therapy

## 2021-02-07 ENCOUNTER — Other Ambulatory Visit: Payer: Self-pay

## 2021-02-07 DIAGNOSIS — R279 Unspecified lack of coordination: Secondary | ICD-10-CM

## 2021-02-07 DIAGNOSIS — R296 Repeated falls: Secondary | ICD-10-CM

## 2021-02-07 DIAGNOSIS — R2689 Other abnormalities of gait and mobility: Secondary | ICD-10-CM

## 2021-02-07 NOTE — Patient Instructions (Signed)
Access Code: [email or web address] URL: [email or web address] Date: 02/07/2021 Prepared by: Everardo All  Exercises Step Forward with Opposite Arm Reach - 1 x daily - 7 x weekly - 1 sets - 10 reps - 5s hold Forward Reach Using Hip Strategy Side to Side - 1 x daily - 7 x weekly - 1 sets - 20 reps Forward Reach Using Hip Strategy Forward Backward - 1 x daily - 7 x weekly - 1 sets - 20 reps Stride Stance Weight Shift - 1 x daily - 7 x weekly - 1 sets - 2 reps - 30s hold Standing Weight Shift Side to Side - 1 x daily - 7 x weekly - 1 sets - 2 reps - 30s hold

## 2021-02-07 NOTE — Therapy (Signed)
[facility] [address] [place], [place], [address] Phone: [phone]   Fax:  [phone]  Physical Therapy Treatment  Patient Details  Name: [name] [name] MRN: [id number] Date of Birth: 09/15/1960 Referring Provider (PT): [name], MD   Encounter Date: 02/07/2021   PT End of Session - 02/07/21 1204     Visit Number 2    Date for PT Re-Evaluation 03/27/21    Authorization Type [employer] Medicare    Authorization - Visit Number 2    Authorization - Number of Visits 60    PT Start Time 1020    PT Stop Time 1100    PT Time Calculation (min) 40 min    Equipment Utilized During Treatment Gait belt    Activity Tolerance Patient tolerated treatment well;Other (comment)    Behavior During Therapy WFL for tasks assessed/performed             Past Medical History:  Diagnosis Date   Adenomatous colon polyp 10/1999   Anxiety    Arthritis    neck, yoga helps.   Cataract    Complex partial seizure ([place])    last seizure was 08-30-2018   Crohn's disease of small and large intestines ([place]) 1999   Depression    Diabetes mellitus    no meds at this time 10-24-17   Esophageal stricture    External hemorrhoids    GERD (gastroesophageal reflux disease)    Glaucoma    History of kidney stones    3 large stones still present   Hyperlipemia    Hypertension    resolved with weight loss    Inguinal hernia, bilateral 12/2019   Liver cirrhosis secondary to NASH ([place])    Migraines    Osteoporosis    PONV (postoperative nausea and vomiting)    PTSD (post-traumatic stress disorder)    Renal cyst, acquired, left 07/08/2017   Skin cancer    squamous cell multiple; [place]; followed every 3 months.   Sleep apnea    CPAP machine, uses nightly   Staphylococcus aureus bacteremia with sepsis ([place]) 05/28/2017   MRSA 2012    Past Surgical History:  Procedure Laterality Date   CATARACT EXTRACTION Bilateral    COLONOSCOPY      DEBRIDEMENT MANDIBLE N/A 12/07/2020   Procedure: DEBRIDEMENT OF MANDIBLE;  Surgeon: [name], DMD;  Location: [place];  Service: Oral Surgery;  Laterality: N/A;   ELBOW SURGERY  2012   elbow MRSA infection    EYE SURGERY     cataracts removed, /w IOL   GROSS TEETH DEBRIDEMENT N/A 12/29/2020   Procedure: MANDIBULAR  DEBRIDEMENT;  Surgeon: [name], DMD;  Location: [place];  Service: Dentistry;  Laterality: N/A;   INCISION AND DRAINAGE ABSCESS N/A 12/07/2020   Procedure: INCISION AND DRAINAGE MANDIBULAR ABSCESS;  Surgeon: [name], DMD;  Location: [place];  Service: Oral Surgery;  Laterality: N/A;   INGUINAL HERNIA REPAIR Right 01/14/2020   Procedure: OPEN RIGHT HERNIA REPAIR INGUINAL WITH MESH;  Surgeon: [name], MD;  Location: [facility];  Service: General;  Laterality: Right;   INSERTION OF MESH N/A 07/01/2016   Procedure: INSERTION OF MESH;  Surgeon: [name], MD;  Location: [place];  Service: General;  Laterality: N/A;   IR URETERAL STENT LEFT NEW ACCESS W/O SEP NEPHROSTOMY CATH  02/02/2018   NEPHROLITHOTOMY Left 02/02/2018   Procedure: NEPHROLITHOTOMY PERCUTANEOUS;  Surgeon: [name], MD;  Location: [facility];  Service:  Urology;  Laterality: Left;   POLYPECTOMY     SCALP LACERATION REPAIR Right 10/16/2017   From fall/staples   SHOULDER ARTHROSCOPY Right 03/05/2018   Procedure: ARTHROSCOPY SHOULDER AND OPEN DISTAL CLAVICLE EXCISION;  Surgeon: [name], MD;  Location: [place];  Service: Orthopedics;  Laterality: Right;   SHOULDER SURGERY     SINUS SURGERY WITH INSTATRAK     TEE WITHOUT CARDIOVERSION N/A 05/09/2017   Procedure: TRANSESOPHAGEAL ECHOCARDIOGRAM (TEE);  Surgeon: [name], MD;  Location: [facility];  Service: Cardiovascular;  Laterality: N/A;   TOOTH EXTRACTION N/A 12/07/2020   Procedure: DENTAL EXTRACTIONS OF TEETH TWENTY TO THIRTY;  Surgeon: [name], DMD;  Location: [place];  Service: Oral Surgery;  Laterality: N/A;   TOOTH  EXTRACTION N/A 12/29/2020   Procedure: DENTAL RESTORATION/EXTRACTIONS;  Surgeon: [name], DMD;  Location: [place];  Service: Dentistry;  Laterality: N/A;   TRANSTHORACIC ECHOCARDIOGRAM  02/22/2011   EF 55-65%; increased pattern of LVH with mild conc hypertrophy, abnormal relaxation & increased filling pressure (grade 2 diastolic dysfunction); atrial septum thickened (lipomatous hypertrophy)   UMBILICAL HERNIA REPAIR N/A 07/01/2016   Procedure: UMBILICAL HERNIA REPAIR WITH MESH;  Surgeon: [name], MD;  Location: [place];  Service: General;  Laterality: N/A;   UPPER GASTROINTESTINAL ENDOSCOPY     VASECTOMY      There were no vitals filed for this visit.   Subjective Assessment - 02/07/21 1148     Subjective Has not fallen since he was last here which he feels is a good thing.    Patient is accompained by: Family member    Pertinent History on disability, has memory problems, liver cirrhosis, [place], osteoporosis with multiple fractures including C6, thrombocytopenia, gluacoma (loss of peripheral vision).  Has lost 75lb through walking but concerned about safety    Limitations Walking    How long can you walk comfortably? 20 min    Diagnostic tests C6 fracture and ligamentum flavum tear - non-surgical    Patient Stated Goals work on coming down and up stairs, balance    Currently in Pain? Yes    Pain Location Jaw    Pain Descriptors / Indicators Aching                [facility] PT Assessment - 02/07/21 0001       Ambulation/Gait   Ambulation/Gait Yes    Ambulation/Gait Assistance 4: Min assist    Ambulation/Gait Assistance Details 430 feet without AD; 100 feet with RW    Assistive device None;Rolling walker    Gait Pattern Trunk flexed;Narrow base of support;Poor foot clearance - left;Poor foot clearance - right;Right steppage;Left steppage;Step-through pattern    Gait Comments min assist for ambulation without AD with intermittent steppage gait pattern and intermittent  scissoring; CGA for ambulation using RW with assist for obstacle negotiation due to poor awareness regarding proximity to objects, continued poor foot clearance      6 minute walk test results    Endurance additional comments 3 minute walk test: 430 feet (P)       Functional Gait  Assessment   Gait assessed  Yes (P)     Gait Level Surface Walks 20 ft in less than 7 sec but greater than 5.5 sec, uses assistive device, slower speed, mild gait deviations, or deviates 6-10 in outside of the 12 in walkway width. (P)     Change in Gait Speed Makes only minor adjustments to walking speed, or accomplishes a change in  speed with significant gait deviations, deviates 10-15 in outside the 12 in walkway width, or changes speed but loses balance but is able to recover and continue walking. (P)     Gait with Horizontal Head Turns Performs head turns with moderate changes in gait velocity, slows down, deviates 10-15 in outside 12 in walkway width but recovers, can continue to walk. (P)     Gait with Vertical Head Turns Performs task with severe disruption of gait (eg, staggers 15 in outside 12 in walkway width, loses balance, stops, reaches for wall). (P)     Gait and Pivot Turn Pivot turns safely in greater than 3 sec and stops with no loss of balance, or pivot turns safely within 3 sec and stops with mild imbalance, requires small steps to catch balance. (P)     Step Over Obstacle Cannot perform without assistance. (P)     Gait with Narrow Base of Support Ambulates less than 4 steps heel to toe or cannot perform without assistance. (P)     Gait with Eyes Closed Cannot walk 20 ft without assistance, severe gait deviations or imbalance, deviates greater than 15 in outside 12 in walkway width or will not attempt task. (P)     Ambulating Backwards Cannot walk 20 ft without assistance, severe gait deviations or imbalance, deviates greater than 15 in outside 12 in walkway width or will not attempt task. (P)     Steps  Alternating feet, must use rail. (P)     Total Score 7 (P)                            [facility] Adult PT Treatment/Exercise - 02/07/21 0001       Neuro Re-ed    Neuro Re-ed Details  contralateral step and reach, 10 repetitions x5 sec hold; Lt/Rt; hand hold x1      Exercises   Exercises Knee/Hip      Knee/Hip Exercises: Aerobic   Nustep L3 x 10 minutes; PT present to assess response to activity                    PT Education - 02/07/21 1147     Education Details Access Code: [email or web address]    Person(s) Educated Patient;Spouse    Methods Other (comment)   emailed to patient/spouse   Comprehension Verbalized understanding              PT Short Term Goals - 02/07/21 1202       PT SHORT TERM GOAL #1   Title Patient will complete 3 minute walk test using LRAD with only supervision to indicate decreased fall risk.    Baseline requiring min-mod assist    Time 4    Period Weeks    Status New    Target Date 03/07/21               PT Long Term Goals - 02/07/21 1203       PT LONG TERM GOAL #3   Title Pt will demo safety/no LOB with community ambulation for 6' walk test, curb up/down and grassy surface with supervision using least restrictive AD or no AD.                   Plan - 02/07/21 1148     Clinical Impression Statement Patient requiring min assist to complete 3 minute walk test without AD with gait pattern worsening with fatigue thus 6  minute walk test not completed as patient progressing to moderate assist and thus would not be accurate. He required CGA to ambulate 100 feet using RW and moderate assistance to ambulate using SPC. Frequent assist provided for obstacle negotiation as patient has poor spatial awareness and decreased attention. He demos intermittent steppage gait and scissoring gait pattern with bilateral poor foot clearance which increases fall risk. Patient FGA score 9 which further indicates increased fall risk. He  demonstrate impaired ability to maintain reciprocal pattern with ambulation and requires cuing at time for reinitiation. Would benefit from continued skilled intervention to address impairments for decreased fall risk and improved mobility at home and in the community.    Personal Factors and Comorbidities Comorbidity 1;Comorbidity 2;Comorbidity 3+;Behavior Pattern;Time since onset of injury/illness/exacerbation    Comorbidities seizures, memory deficits, osteoporosis with Hx of multiple fractures    Examination-Activity Limitations Locomotion Level;Transfers;Stand;Bend    Examination-Participation Restrictions Community Activity;Driving    Rehab Potential Good    PT Frequency 2x / week    PT Duration 8 weeks    PT Treatment/Interventions ADLs/Self Care Home Management;Gait training;Stair training;Functional mobility training;Therapeutic activities;Therapeutic exercise;Balance training;Neuromuscular re-education;Manual techniques;Patient/family education;Passive range of motion    PT Next Visit Plan gait training; static and dynamic balance; reciprocal pattern training (opposite UE/LE movement; trunk dissociation)    PT Home Exercise Plan [email or web address]    Consulted and Agree with Plan of Care Patient;Family member/caregiver    Family Member Consulted Pt's wife             Patient will benefit from skilled therapeutic intervention in order to improve the following deficits and impairments:  Decreased coordination, Decreased mobility, Postural dysfunction, Pain, Decreased safety awareness, Impaired perceived functional ability, Decreased balance, Decreased cognition, Impaired flexibility, Dizziness  Visit Diagnosis: Repeated falls  Unspecified lack of coordination  Other abnormalities of gait and mobility     Problem List Patient Active Problem List   Diagnosis Date Noted   Liver cirrhosis secondary to NASH ([place])    GERD (gastroesophageal reflux disease)    Pancytopenia ([facility])     Osteomyelitis of jaw 12/29/2020   Acute osteomyelitis of mandible 12/26/2020   S/P inguinal hernia repair 01/14/2020   At high risk for falls    S/P shoulder surgery 03/05/2018   Excessive daytime sleepiness 02/18/2018   Crohn's disease of colon with rectal bleeding ([place]) 02/18/2018   Blood in stool 02/18/2018   Iron deficiency anemia due to chronic blood loss 02/18/2018   Iron deficiency anemia due to sideropenic dysphagia 02/18/2018   Renal calculus, left 02/02/2018   History of sepsis 12/16/2017   Renal cyst, acquired, left 07/08/2017   Frequent falls 07/08/2017   Renal lesion 05/28/2017   MSSA bacteremia 05/06/2017   Confusion 04/02/2017   Syncope 08/30/2016   Depression 04/30/2016   Memory loss 04/30/2016   OSA on CPAP 10/31/2015   Nonepileptic episode ([place]) 04/25/2015   Seizure ([place]) 04/04/2013   Abnormal EKG 03/12/2013   DOE (dyspnea on exertion) 03/12/2013   Complex partial seizure ([place]) 04/19/2012   DM2 (diabetes mellitus, type 2) ([place]) 09/25/2011   OTHER DYSPHAGIA 03/27/2009   Cough 11/10/2008   GERD 12/07/2007   COLONIC POLYPS, ADENOMATOUS, HX OF 12/07/2007   EXTERNAL HEMORRHOIDS 09/11/2007   CROHN'S DISEASE, LARGE AND SMALL INTESTINES 09/11/2007   OSTEOPOROSIS 09/11/2007   HYPERLIPIDEMIA NEC/NOS 02/27/2007   Essential hypertension 02/27/2007   Everardo All PT, DPT 02/07/21 12:05 PM   [facility] [address]  [place], [place], [address] Phone: [phone]   Fax:  [phone]  Name: [name] [name] MRN: [id number] Date of Birth: August 01, 1960

## 2021-02-12 ENCOUNTER — Encounter: Payer: Self-pay | Admitting: Gastroenterology

## 2021-02-13 ENCOUNTER — Ambulatory Visit: Payer: 59 | Admitting: Physical Therapy

## 2021-02-13 ENCOUNTER — Other Ambulatory Visit: Payer: Self-pay

## 2021-02-13 DIAGNOSIS — R296 Repeated falls: Secondary | ICD-10-CM | POA: Diagnosis not present

## 2021-02-13 DIAGNOSIS — M6281 Muscle weakness (generalized): Secondary | ICD-10-CM

## 2021-02-13 DIAGNOSIS — R279 Unspecified lack of coordination: Secondary | ICD-10-CM

## 2021-02-13 NOTE — Therapy (Signed)
[facility] [address] [place], [place], [address] Phone: [phone]   Fax:  [phone]  Physical Therapy Treatment  Patient Details  Name: [name] [name] MRN: [id number] Date of Birth: [birth year] Referring Provider (PT): [name], MD   Encounter Date: 02/13/2021   PT End of Session - 02/13/21 1008     Visit Number 3    Date for PT Re-Evaluation 03/27/21    Authorization Type UHC Medicare    Authorization - Visit Number 3    Authorization - Number of Visits 60    PT Start Time 0940    PT Stop Time 1011    PT Time Calculation (min) 31 min    Equipment Utilized During Treatment Gait belt    Activity Tolerance Patient tolerated treatment well;Other (comment)    Behavior During Therapy WFL for tasks assessed/performed             Past Medical History:  Diagnosis Date   Adenomatous colon polyp 10/1999   Anxiety    Arthritis    neck, yoga helps.   Cataract    Complex partial seizure ([place])    last seizure was 08-30-2018   Crohn's disease of small and large intestines ([place]) 1999   Depression    Diabetes mellitus    no meds at this time 10-24-17   Esophageal stricture    External hemorrhoids    GERD (gastroesophageal reflux disease)    Glaucoma    History of kidney stones    3 large stones still present   Hyperlipemia    Hypertension    resolved with weight loss    Inguinal hernia, bilateral 12/2019   Liver cirrhosis secondary to NASH ([place])    Migraines    Osteoporosis    PONV (postoperative nausea and vomiting)    PTSD (post-traumatic stress disorder)    Renal cyst, acquired, left 07/08/2017   Skin cancer    squamous cell multiple; [place]; followed every 3 months.   Sleep apnea    CPAP machine, uses nightly   Staphylococcus aureus bacteremia with sepsis ([place]) 05/28/2017   MRSA 2012    Past Surgical History:  Procedure Laterality Date   CATARACT EXTRACTION Bilateral    COLONOSCOPY      DEBRIDEMENT MANDIBLE N/A 12/07/2020   Procedure: DEBRIDEMENT OF MANDIBLE;  Surgeon: [name], DMD;  Location: [place];  Service: Oral Surgery;  Laterality: N/A;   ELBOW SURGERY  2012   elbow MRSA infection    EYE SURGERY     cataracts removed, /w IOL   GROSS TEETH DEBRIDEMENT N/A 12/29/2020   Procedure: MANDIBULAR  DEBRIDEMENT;  Surgeon: [name], DMD;  Location: [place];  Service: Dentistry;  Laterality: N/A;   INCISION AND DRAINAGE ABSCESS N/A 12/07/2020   Procedure: INCISION AND DRAINAGE MANDIBULAR ABSCESS;  Surgeon: [name], DMD;  Location: [place];  Service: Oral Surgery;  Laterality: N/A;   INGUINAL HERNIA REPAIR Right 01/14/2020   Procedure: OPEN RIGHT HERNIA REPAIR INGUINAL WITH MESH;  Surgeon: [name], MD;  [facility];  Service: General;  Laterality: Right;   INSERTION OF MESH N/A 07/01/2016   Procedure: INSERTION OF MESH;  Surgeon: [name], MD;  Location: [place];  Service: General;  Laterality: N/A;   IR URETERAL STENT LEFT NEW ACCESS W/O SEP NEPHROSTOMY CATH  02/02/2018   NEPHROLITHOTOMY Left 02/02/2018   Procedure: NEPHROLITHOTOMY PERCUTANEOUS;  Surgeon: [name], MD;  [facility];  Service:  Urology;  Laterality: Left;   POLYPECTOMY     SCALP LACERATION REPAIR Right 10/16/2017   From fall/staples   SHOULDER ARTHROSCOPY Right 03/05/2018   Procedure: ARTHROSCOPY SHOULDER AND OPEN DISTAL CLAVICLE EXCISION;  Surgeon: [name], MD;  Location: [place];  Service: Orthopedics;  Laterality: Right;   SHOULDER SURGERY     SINUS SURGERY WITH INSTATRAK     TEE WITHOUT CARDIOVERSION N/A 05/09/2017   Procedure: TRANSESOPHAGEAL ECHOCARDIOGRAM (TEE);  Surgeon: [name], MD;  [facility];  Service: Cardiovascular;  Laterality: N/A;   TOOTH EXTRACTION N/A 12/07/2020   Procedure: DENTAL EXTRACTIONS OF TEETH TWENTY TO THIRTY;  Surgeon: [name], DMD;  Location: [place];  Service: Oral Surgery;  Laterality: N/A;   TOOTH  EXTRACTION N/A 12/29/2020   Procedure: DENTAL RESTORATION/EXTRACTIONS;  Surgeon: [name], DMD;  Location: [place];  Service: Dentistry;  Laterality: N/A;   TRANSTHORACIC ECHOCARDIOGRAM  02/22/2011   EF 55-65%; increased pattern of LVH with mild conc hypertrophy, abnormal relaxation & increased filling pressure (grade 2 diastolic dysfunction); atrial septum thickened (lipomatous hypertrophy)   UMBILICAL HERNIA REPAIR N/A 07/01/2016   Procedure: UMBILICAL HERNIA REPAIR WITH MESH;  Surgeon: [name], MD;  Location: [place];  Service: General;  Laterality: N/A;   UPPER GASTROINTESTINAL ENDOSCOPY     VASECTOMY      There were no vitals filed for this visit.   Subjective Assessment - 02/13/21 0944     Subjective Pt reports no falls since last vist and reports the walking sticks have helped with balance.    Patient is accompained by: Family member    Pertinent History on disability, has memory problems, liver cirrhosis, Chrons, osteoporosis with multiple fractures including C6, thrombocytopenia, gluacoma (loss of peripheral vision).  Has lost 75lb through walking but concerned about safety    Limitations Walking    How long can you walk comfortably? 20 min    Diagnostic tests C6 fracture and ligamentum flavum tear - non-surgical    Patient Stated Goals work on coming down and up stairs, balance    Currently in Pain? Yes    Pain Score 7     Pain Location Neck    Pain Orientation Posterior    Pain Descriptors / Indicators Aching                               [facility] Adult PT Treatment/Exercise - 02/13/21 0001       Balance   Balance Assessed Yes      Dynamic Standing Balance   Dynamic Standing - Comments toe taps 4" step up 2x10 min A grossly with one LOB to Lt side and max A to recover.      Exercises   Exercises Knee/Hip;Lumbar      Knee/Hip Exercises: Seated   Long Arc Quad Both;15 reps    Long Arc Quad Weight 4 lbs.    Marching Both;15 reps;Weights     Marching Weights 4 lbs.    Sit to [employer] 10 reps                    PT Education - 02/13/21 1011     Education Details Pt educated on techniques with balance retraining to decrease fall risk. Access Code: [email or web address]    Person(s) Educated Patient;Spouse    Methods Explanation;Demonstration;Tactile cues;Verbal cues    Comprehension Verbalized understanding;Returned demonstration  PT Short Term Goals - 02/07/21 1202       PT SHORT TERM GOAL #1   Title Patient will complete 3 minute walk test using LRAD with only supervision to indicate decreased fall risk.    Baseline requiring min-mod assist    Time 4    Period Weeks    Status New    Target Date 03/07/21               PT Long Term Goals - 02/07/21 1203       PT LONG TERM GOAL #3   Title Pt will demo safety/no LOB with community ambulation for 6' walk test, curb up/down and grassy surface with supervision using least restrictive AD or no AD.                   Plan - 02/13/21 1012     Clinical Impression Statement Pt presenting to clinic and reports no additional falls and reports walking sticks has helped his walking at home. Pt session focused on balance in standing and hip strengthening. Pt required extra time throughout session for rest breaks reporting he walked 2.5 miles before session and was a little tired. Pt required min - mod A for standing balance activities due to poor righting reactions mostly to the Lt. Pt required max A for recovery with one LOB in standing activity but min A rest of activity to maintain balance, cues throughout for techniqe and to stop to reset balance as needed instead of continuing to complete activity despite LOB. pt then improved overall with balance to light min A. gait belt used throughout. Pt tolerated session well. Pt would benefit from continued PT for improved processing, tolerance to activity, safety awareness, strength, balance, decreased fall risk.     Personal Factors and Comorbidities Comorbidity 1;Comorbidity 2;Comorbidity 3+;Behavior Pattern;Time since onset of injury/illness/exacerbation    Comorbidities seizures, memory deficits, osteoporosis with Hx of multiple fractures    Examination-Activity Limitations Locomotion Level;Transfers;Stand;Bend    Examination-Participation Restrictions Community Activity;Driving    Stability/Clinical Decision Making Evolving/Moderate complexity    Clinical Decision Making Moderate    Rehab Potential Good    PT Frequency 2x / week    PT Duration 8 weeks    PT Treatment/Interventions ADLs/Self Care Home Management;Gait training;Stair training;Functional mobility training;Therapeutic activities;Therapeutic exercise;Balance training;Neuromuscular re-education;Manual techniques;Patient/family education;Passive range of motion    PT Next Visit Plan gait training; static and dynamic balance; reciprocal pattern training (opposite UE/LE movement; trunk dissociation)    PT Home Exercise Plan [email or web address]    Consulted and Agree with Plan of Care Patient;Family member/caregiver    Family Member Consulted Pt's wife             Patient will benefit from skilled therapeutic intervention in order to improve the following deficits and impairments:  Decreased coordination, Decreased mobility, Postural dysfunction, Pain, Decreased safety awareness, Impaired perceived functional ability, Decreased balance, Decreased cognition, Impaired flexibility, Dizziness  Visit Diagnosis: Repeated falls  Muscle weakness (generalized)  Lack of coordination     Problem List Patient Active Problem List   Diagnosis Date Noted   Liver cirrhosis secondary to NASH ([place])    GERD (gastroesophageal reflux disease)    Pancytopenia ([facility])    Osteomyelitis of jaw 12/29/2020   Acute osteomyelitis of mandible 12/26/2020   S/P inguinal hernia repair 01/14/2020   At high risk for falls    S/P shoulder surgery 03/05/2018   Excessive  daytime sleepiness 02/18/2018   Crohn's disease of colon  with rectal bleeding ([place]) 02/18/2018   Blood in stool 02/18/2018   Iron deficiency anemia due to chronic blood loss 02/18/2018   Iron deficiency anemia due to sideropenic dysphagia 02/18/2018   Renal calculus, left 02/02/2018   History of sepsis 12/16/2017   Renal cyst, acquired, left 07/08/2017   Frequent falls 07/08/2017   Renal lesion 05/28/2017   MSSA bacteremia 05/06/2017   Confusion 04/02/2017   Syncope 08/30/2016   Depression 04/30/2016   Memory loss 04/30/2016   OSA on CPAP 10/31/2015   Nonepileptic episode ([place]) 04/25/2015   Seizure ([place]) 04/04/2013   Abnormal EKG 03/12/2013   DOE (dyspnea on exertion) 03/12/2013   Complex partial seizure ([place]) 04/19/2012   DM2 (diabetes mellitus, type 2) ([place]) 09/25/2011   OTHER DYSPHAGIA 03/27/2009   Cough 11/10/2008   GERD 12/07/2007   COLONIC POLYPS, ADENOMATOUS, HX OF 12/07/2007   EXTERNAL HEMORRHOIDS 09/11/2007   CROHN'S DISEASE, LARGE AND SMALL INTESTINES 09/11/2007   OSTEOPOROSIS 09/11/2007   HYPERLIPIDEMIA NEC/NOS 02/27/2007   Essential hypertension 02/27/2007   Stacy Gardner, PT 02/13/2209:21 AM   [facility] [address], [place], [place], [address] Phone: [phone]   Fax:  [phone]  Name: OCTAVIUS SHIN MRN: [id number] Date of Birth: [birth year]

## 2021-02-15 ENCOUNTER — Other Ambulatory Visit: Payer: Self-pay

## 2021-02-15 ENCOUNTER — Ambulatory Visit: Payer: 59 | Admitting: Physical Therapy

## 2021-02-15 DIAGNOSIS — R296 Repeated falls: Secondary | ICD-10-CM | POA: Diagnosis not present

## 2021-02-15 DIAGNOSIS — R279 Unspecified lack of coordination: Secondary | ICD-10-CM

## 2021-02-15 DIAGNOSIS — M6281 Muscle weakness (generalized): Secondary | ICD-10-CM

## 2021-02-15 NOTE — Therapy (Signed)
[facility] [address] [place], [place], [address] Phone: [phone]   Fax:  [phone]  Physical Therapy Treatment  Patient Details  Name: GABRIEL CONRY MRN: [id number] Date of Birth: 10/18/1960 Referring Provider (PT): [name], MD   Encounter Date: 02/15/2021   PT End of Session - 02/15/21 0927     Visit Number 4    Date for PT Re-Evaluation 03/27/21    Authorization Type UHC Medicare    Authorization - Visit Number 4    Authorization - Number of Visits 60    PT Start Time 0037    PT Stop Time 0930    PT Time Calculation (min) 41 min    Equipment Utilized During Treatment Gait belt    Activity Tolerance Patient tolerated treatment well;Other (comment)    Behavior During Therapy WFL for tasks assessed/performed             Past Medical History:  Diagnosis Date   Adenomatous colon polyp 10/1999   Anxiety    Arthritis    neck, yoga helps.   Cataract    Complex partial seizure ([place])    last seizure was 08-30-2018   Crohn's disease of small and large intestines ([place]) 1999   Depression    Diabetes mellitus    no meds at this time 10-24-17   Esophageal stricture    External hemorrhoids    GERD (gastroesophageal reflux disease)    Glaucoma    History of kidney stones    3 large stones still present   Hyperlipemia    Hypertension    resolved with weight loss    Inguinal hernia, bilateral 12/2019   Liver cirrhosis secondary to NASH ([place])    Migraines    Osteoporosis    PONV (postoperative nausea and vomiting)    PTSD (post-traumatic stress disorder)    Renal cyst, acquired, left 07/08/2017   Skin cancer    squamous cell multiple; [place]; followed every 3 months.   Sleep apnea    CPAP machine, uses nightly   Staphylococcus aureus bacteremia with sepsis ([place]) 05/28/2017   MRSA 2012    Past Surgical History:  Procedure Laterality Date   CATARACT EXTRACTION Bilateral    COLONOSCOPY      DEBRIDEMENT MANDIBLE N/A 12/07/2020   Procedure: DEBRIDEMENT OF MANDIBLE;  Surgeon: [name], DMD;  Location: [place];  Service: Oral Surgery;  Laterality: N/A;   ELBOW SURGERY  2012   elbow MRSA infection    EYE SURGERY     cataracts removed, /w IOL   GROSS TEETH DEBRIDEMENT N/A 12/29/2020   Procedure: MANDIBULAR  DEBRIDEMENT;  Surgeon: [name], DMD;  Location: [place];  Service: Dentistry;  Laterality: N/A;   INCISION AND DRAINAGE ABSCESS N/A 12/07/2020   Procedure: INCISION AND DRAINAGE MANDIBULAR ABSCESS;  Surgeon: [name], DMD;  Location: [place];  Service: Oral Surgery;  Laterality: N/A;   INGUINAL HERNIA REPAIR Right 01/14/2020   Procedure: OPEN RIGHT HERNIA REPAIR INGUINAL WITH MESH;  Surgeon: [name], MD;  [facility];  Service: General;  Laterality: Right;   INSERTION OF MESH N/A 07/01/2016   Procedure: INSERTION OF MESH;  Surgeon: [name], MD;  Location: [place];  Service: General;  Laterality: N/A;   IR URETERAL STENT LEFT NEW ACCESS W/O SEP NEPHROSTOMY CATH  02/02/2018   NEPHROLITHOTOMY Left 02/02/2018   Procedure: NEPHROLITHOTOMY PERCUTANEOUS;  Surgeon: [name], MD;  [facility];  Service:  Urology;  Laterality: Left;   POLYPECTOMY     SCALP LACERATION REPAIR Right 10/16/2017   From fall/staples   SHOULDER ARTHROSCOPY Right 03/05/2018   Procedure: ARTHROSCOPY SHOULDER AND OPEN DISTAL CLAVICLE EXCISION;  Surgeon: [name], MD;  Location: [place];  Service: Orthopedics;  Laterality: Right;   SHOULDER SURGERY     SINUS SURGERY WITH INSTATRAK     TEE WITHOUT CARDIOVERSION N/A 05/09/2017   Procedure: TRANSESOPHAGEAL ECHOCARDIOGRAM (TEE);  Surgeon: [name], MD;  Location: [facility] ENDOSCOPY;  Service: Cardiovascular;  Laterality: N/A;   TOOTH EXTRACTION N/A 12/07/2020   Procedure: DENTAL EXTRACTIONS OF TEETH TWENTY TO THIRTY;  Surgeon: [name], DMD;  Location: [place];  Service: Oral Surgery;  Laterality: N/A;   TOOTH  EXTRACTION N/A 12/29/2020   Procedure: DENTAL RESTORATION/EXTRACTIONS;  Surgeon: [name], DMD;  Location: [place];  Service: Dentistry;  Laterality: N/A;   TRANSTHORACIC ECHOCARDIOGRAM  02/22/2011   EF 55-65%; increased pattern of LVH with mild conc hypertrophy, abnormal relaxation & increased filling pressure (grade 2 diastolic dysfunction); atrial septum thickened (lipomatous hypertrophy)   UMBILICAL HERNIA REPAIR N/A 07/01/2016   Procedure: UMBILICAL HERNIA REPAIR WITH MESH;  Surgeon: [name], MD;  Location: [place];  Service: General;  Laterality: N/A;   UPPER GASTROINTESTINAL ENDOSCOPY     VASECTOMY      There were no vitals filed for this visit.   Subjective Assessment - 02/15/21 0853     Subjective Pt reports some pain in mouth today where he had teeth removed and Rt shoulder pain, no additional falls.    Patient is accompained by: Family member    Pertinent History on disability, has memory problems, liver cirrhosis, Chrons, osteoporosis with multiple fractures including C6, thrombocytopenia, gluacoma (loss of peripheral vision).  Has lost 75lb through walking but concerned about safety    Limitations Walking    How long can you walk comfortably? 2.5 miles with walking sticks    Diagnostic tests C6 fracture and ligamentum flavum tear - non-surgical    Patient Stated Goals work on coming down and up stairs, balance    Currently in Pain? Yes    Pain Score 6     Pain Location Shoulder    Pain Orientation Right    Pain Descriptors / Indicators Aching    Pain Onset More than a month ago    Pain Frequency Constant                               [facility] Adult PT Treatment/Exercise - 02/15/21 0001       Ambulation/Gait   Ambulation/Gait Yes    Ambulation/Gait Assistance 4: Min assist    Ambulation Distance (Feet) 540 Feet    Assistive device None    Gait Pattern Trunk flexed;Narrow base of support;Poor foot clearance - left;Poor foot clearance -  right;Right steppage;Left steppage;Step-through pattern    Gait Comments min A for stability throughout with x4 toe catches and need of balance "reset" then able to recover and continue walking      Balance   Balance Assessed Yes      Dynamic Standing Balance   Dynamic Standing - Comments toe taps 4" step min A-CGA with VC to decrease speed and regain balance before starting each step 2x15. 2x10 lateral steps to target min A-CGA to complete.  PT Education - 02/15/21 0927     Education Details Pt educated on techniques with balance retraining to decrease fall risk. Access Code: [email or web address]    Person(s) Educated Patient    Methods Explanation;Demonstration;Tactile cues;Verbal cues    Comprehension Verbalized understanding;Returned demonstration              PT Short Term Goals - 02/07/21 1202       PT SHORT TERM GOAL #1   Title Patient will complete 3 minute walk test using LRAD with only supervision to indicate decreased fall risk.    Baseline requiring min-mod assist    Time 4    Period Weeks    Status New    Target Date 03/07/21               PT Long Term Goals - 02/07/21 1203       PT LONG TERM GOAL #3   Title Pt will demo safety/no LOB with community ambulation for 6' walk test, curb up/down and grassy surface with supervision using least restrictive AD or no AD.                   Plan - 02/15/21 0928     Clinical Impression Statement Pt presents to clinc and reports he has been attempting to walk more at home with walking sticks and is now up to 2.5 miles but is slower than hed like to be. Pt educated to maintain good safey awareness and rest when needed and avoid tripping hazards and he reports he does this. Pt session focused on standing balance and gait training. Pt benefited from [facility] for gait training for increased trunk extension and increased BOS with steps to decreased tripping over his feet with narrowed BOS and increase  step height bil. Pt required x4 stops to reset balance and improve standing position prior to starting next step to decrease fall risk. Pt also required CGA-min A consistently with standing balance without AD used throughout session. Pt would benefit from continued PT for improved processing, tolerance to activity, safety awareness, strength, balance, decreased fall risk.    Personal Factors and Comorbidities Comorbidity 1;Comorbidity 2;Comorbidity 3+;Behavior Pattern;Time since onset of injury/illness/exacerbation    Comorbidities seizures, memory deficits, osteoporosis with Hx of multiple fractures    Examination-Activity Limitations Locomotion Level;Transfers;Stand;Bend    Examination-Participation Restrictions Community Activity;Driving    Stability/Clinical Decision Making Evolving/Moderate complexity    Clinical Decision Making Moderate    Rehab Potential Good    PT Frequency 2x / week    PT Duration 8 weeks    PT Treatment/Interventions ADLs/Self Care Home Management;Gait training;Stair training;Functional mobility training;Therapeutic activities;Therapeutic exercise;Balance training;Neuromuscular re-education;Manual techniques;Patient/family education;Passive range of motion    PT Next Visit Plan gait training; static and dynamic balance; reciprocal pattern training (opposite UE/LE movement; trunk dissociation)    PT Home Exercise Plan [email or web address]    Consulted and Agree with Plan of Care Patient;Family member/caregiver    Family Member Consulted Pt's wife             Patient will benefit from skilled therapeutic intervention in order to improve the following deficits and impairments:  Decreased coordination, Decreased mobility, Postural dysfunction, Pain, Decreased safety awareness, Impaired perceived functional ability, Decreased balance, Decreased cognition, Impaired flexibility, Dizziness  Visit Diagnosis: Muscle weakness (generalized)  Lack of coordination  Repeated  falls     Problem List Patient Active Problem List   Diagnosis Date Noted   Liver cirrhosis secondary to NASH ([place])  GERD (gastroesophageal reflux disease)    Pancytopenia ([facility])    Osteomyelitis of jaw 12/29/2020   Acute osteomyelitis of mandible 12/26/2020   S/P inguinal hernia repair 01/14/2020   At high risk for falls    S/P shoulder surgery 03/05/2018   Excessive daytime sleepiness 02/18/2018   Crohn's disease of colon with rectal bleeding ([place]) 02/18/2018   Blood in stool 02/18/2018   Iron deficiency anemia due to chronic blood loss 02/18/2018   Iron deficiency anemia due to sideropenic dysphagia 02/18/2018   Renal calculus, left 02/02/2018   History of sepsis 12/16/2017   Renal cyst, acquired, left 07/08/2017   Frequent falls 07/08/2017   Renal lesion 05/28/2017   MSSA bacteremia 05/06/2017   Confusion 04/02/2017   Syncope 08/30/2016   Depression 04/30/2016   Memory loss 04/30/2016   OSA on CPAP 10/31/2015   Nonepileptic episode ([place]) 04/25/2015   Seizure ([place]) 04/04/2013   Abnormal EKG 03/12/2013   DOE (dyspnea on exertion) 03/12/2013   Complex partial seizure ([place]) 04/19/2012   DM2 (diabetes mellitus, type 2) ([place]) 09/25/2011   OTHER DYSPHAGIA 03/27/2009   Cough 11/10/2008   GERD 12/07/2007   COLONIC POLYPS, ADENOMATOUS, HX OF 12/07/2007   EXTERNAL HEMORRHOIDS 09/11/2007   CROHN'S DISEASE, LARGE AND SMALL INTESTINES 09/11/2007   OSTEOPOROSIS 09/11/2007   HYPERLIPIDEMIA NEC/NOS 02/27/2007   Essential hypertension 02/27/2007   Stacy Gardner, PT 08/25/229:35 AM   [facility] [address], [place], [place], [address] Phone: [phone]   Fax:  [phone]  Name: SAHARSH STERLING MRN: [id number] Date of Birth: 1961-04-25

## 2021-02-19 ENCOUNTER — Other Ambulatory Visit: Payer: Self-pay

## 2021-02-19 ENCOUNTER — Ambulatory Visit: Payer: 59 | Admitting: Physical Therapy

## 2021-02-19 ENCOUNTER — Encounter: Payer: Self-pay | Admitting: Physical Therapy

## 2021-02-19 DIAGNOSIS — R296 Repeated falls: Secondary | ICD-10-CM | POA: Diagnosis not present

## 2021-02-19 DIAGNOSIS — R279 Unspecified lack of coordination: Secondary | ICD-10-CM

## 2021-02-19 DIAGNOSIS — R2689 Other abnormalities of gait and mobility: Secondary | ICD-10-CM

## 2021-02-19 DIAGNOSIS — M6281 Muscle weakness (generalized): Secondary | ICD-10-CM

## 2021-02-19 NOTE — Therapy (Signed)
[facility] [address] [place], [place], [address] Phone: [phone]   Fax:  [phone]  Physical Therapy Treatment  Patient Details  Name: [name] [name] MRN: [id number] Date of Birth: 1961/02/17 Referring Provider (PT): [name], MD   Encounter Date: 02/19/2021   PT End of Session - 02/19/21 0852     Visit Number 5    Date for PT Re-Evaluation 03/27/21    Authorization Type UHC Medicare    Authorization - Visit Number 5    Authorization - Number of Visits 60    PT Start Time [phone]    PT Stop Time 0930    PT Time Calculation (min) 43 min    Equipment Utilized During Treatment Gait belt    Activity Tolerance Patient tolerated treatment well    Behavior During Therapy [facility] for tasks assessed/performed             Past Medical History:  Diagnosis Date   Adenomatous colon polyp 10/1999   Anxiety    Arthritis    neck, yoga helps.   Cataract    Complex partial seizure ([place])    last seizure was 08-30-2018   Crohn's disease of small and large intestines ([place]) 1999   Depression    Diabetes mellitus    no meds at this time 10-24-17   Esophageal stricture    External hemorrhoids    GERD (gastroesophageal reflux disease)    Glaucoma    History of kidney stones    3 large stones still present   Hyperlipemia    Hypertension    resolved with weight loss    Inguinal hernia, bilateral 12/2019   Liver cirrhosis secondary to NASH ([place])    Migraines    Osteoporosis    PONV (postoperative nausea and vomiting)    PTSD (post-traumatic stress disorder)    Renal cyst, acquired, left 07/08/2017   Skin cancer    squamous cell multiple; [place]; followed every 3 months.   Sleep apnea    CPAP machine, uses nightly   Staphylococcus aureus bacteremia with sepsis ([place]) 05/28/2017   MRSA 2012    Past Surgical History:  Procedure Laterality Date   CATARACT EXTRACTION Bilateral    COLONOSCOPY     DEBRIDEMENT  MANDIBLE N/A 12/07/2020   Procedure: DEBRIDEMENT OF MANDIBLE;  Surgeon: [name], DMD;  Location: [place];  Service: Oral Surgery;  Laterality: N/A;   ELBOW SURGERY  2012   elbow MRSA infection    EYE SURGERY     cataracts removed, /w IOL   GROSS TEETH DEBRIDEMENT N/A 12/29/2020   Procedure: MANDIBULAR  DEBRIDEMENT;  Surgeon: [name], DMD;  Location: [place];  Service: Dentistry;  Laterality: N/A;   INCISION AND DRAINAGE ABSCESS N/A 12/07/2020   Procedure: INCISION AND DRAINAGE MANDIBULAR ABSCESS;  Surgeon: [name], DMD;  Location: [place];  Service: Oral Surgery;  Laterality: N/A;   INGUINAL HERNIA REPAIR Right 01/14/2020   Procedure: OPEN RIGHT HERNIA REPAIR INGUINAL WITH MESH;  Surgeon: [name], MD;  [facility];  Service: General;  Laterality: Right;   INSERTION OF MESH N/A 07/01/2016   Procedure: INSERTION OF MESH;  Surgeon: [name], MD;  Location: [place];  Service: General;  Laterality: N/A;   IR URETERAL STENT LEFT NEW ACCESS W/O SEP NEPHROSTOMY CATH  02/02/2018   NEPHROLITHOTOMY Left 02/02/2018   Procedure: NEPHROLITHOTOMY PERCUTANEOUS;  Surgeon: [name], MD;  [facility];  Service: Urology;  Laterality: Left;   POLYPECTOMY     SCALP LACERATION REPAIR Right 10/16/2017   From fall/staples   SHOULDER ARTHROSCOPY Right 03/05/2018   Procedure: ARTHROSCOPY SHOULDER AND OPEN DISTAL CLAVICLE EXCISION;  Surgeon: [name], MD;  Location: [place];  Service: Orthopedics;  Laterality: Right;   SHOULDER SURGERY     SINUS SURGERY WITH INSTATRAK     TEE WITHOUT CARDIOVERSION N/A 05/09/2017   Procedure: TRANSESOPHAGEAL ECHOCARDIOGRAM (TEE);  Surgeon: [name], MD;  Location: [facility];  Service: Cardiovascular;  Laterality: N/A;   TOOTH EXTRACTION N/A 12/07/2020   Procedure: DENTAL EXTRACTIONS OF TEETH TWENTY TO THIRTY;  Surgeon: [name], DMD;  Location: [place];  Service: Oral Surgery;  Laterality: N/A;   TOOTH EXTRACTION N/A  12/29/2020   Procedure: DENTAL RESTORATION/EXTRACTIONS;  Surgeon: [name], DMD;  Location: [place];  Service: Dentistry;  Laterality: N/A;   TRANSTHORACIC ECHOCARDIOGRAM  02/22/2011   EF 55-65%; increased pattern of LVH with mild conc hypertrophy, abnormal relaxation & increased filling pressure (grade 2 diastolic dysfunction); atrial septum thickened (lipomatous hypertrophy)   UMBILICAL HERNIA REPAIR N/A 07/01/2016   Procedure: UMBILICAL HERNIA REPAIR WITH MESH;  Surgeon: [name], MD;  Location: [place];  Service: General;  Laterality: N/A;   UPPER GASTROINTESTINAL ENDOSCOPY     VASECTOMY      There were no vitals filed for this visit.   Subjective Assessment - 02/19/21 0849     Subjective I really like the walking sticks.  It keeps me more forward when I walk.  I walked a mile already today.  No falls since starting PT.  I do still get dizzy sometimes.    Patient is accompained by: Family member    Pertinent History on disability, has memory problems, liver cirrhosis, Chrons, osteoporosis with multiple fractures including C6, thrombocytopenia, gluacoma (loss of peripheral vision).  Has lost 75lb through walking but concerned about safety    Limitations Walking    How long can you walk comfortably? 2.5 miles with walking sticks    Diagnostic tests C6 fracture and ligamentum flavum tear - non-surgical    Patient Stated Goals work on coming down and up stairs, balance    Currently in Pain? No/denies                               [facility] Adult PT Treatment/Exercise - 02/19/21 0001       Neuro Re-ed    Neuro Re-ed Details  turn on vestibular system: march with contralateral shoulder flexion x 20, march with contralateral knee taps x 20      Exercises   Exercises Lumbar;Knee/Hip      Lumbar Exercises: Aerobic   Nustep L5 x 3' PT present to discuss plan for session      Knee/Hip Exercises: Machines for Strengthening   Total Gym Leg Press 100lb x 30 reps  bil LE      Knee/Hip Exercises: Standing   SLS with Vectors 3-way clock drill x 3 rounds each side, with gait belt, VC for coordination and CGA/min A for LOB    Gait Training with gait belt for all and CGA as needed for LOB, VC needed for coordination: box stepping to discs on floor x 5 rounds each way, shuttle walks fwd/bwd to 3 discs spaced at different distances across carpet, then forward walk around each disc and back x 2 laps, 90 degree turns x 5 reps each  Rt/Lt                 Balance Exercises - 02/19/21 0001       Balance Exercises: Standing   Standing, One Foot on a Step Eyes closed;6 inch;1 rep;20 secs                 PT Short Term Goals - 02/07/21 1202       PT SHORT TERM GOAL #1   Title Patient will complete 3 minute walk test using LRAD with only supervision to indicate decreased fall risk.    Baseline requiring min-mod assist    Time 4    Period Weeks    Status New    Target Date 03/07/21               PT Long Term Goals - 02/07/21 1203       PT LONG TERM GOAL #3   Title Pt will demo safety/no LOB with community ambulation for 6' walk test, curb up/down and grassy surface with supervision using least restrictive AD or no AD.                   Plan - 02/19/21 0936     Clinical Impression Statement Pt continues to use walking sticks and is doing multiple walks for shorter duration to allow for strength and energy reset to maximize safety and improved posture within bouts of ambulation.  Session focused on balance and gait challenges with multi-direction stepping strategies, forward and backward ambulation direction changes, footwork for 90 deg turns, and changes of direction.  Pt needed gait belt with CGA and occassional min A for LOB.  Pt needs consistent VC for higher level challenges for cognitive and coodination deficits.  He will continue to benefit from skilled PT along POC.    Comorbidities seizures, memory deficits, osteoporosis  with Hx of multiple fractures    PT Frequency 2x / week    PT Duration 8 weeks    PT Treatment/Interventions ADLs/Self Care Home Management;Gait training;Stair training;Functional mobility training;Therapeutic activities;Therapeutic exercise;Balance training;Neuromuscular re-education;Manual techniques;Patient/family education;Passive range of motion    PT Next Visit Plan gait training; static and dynamic balance; reciprocal pattern training (opposite UE/LE movement; trunk dissociation)    PT Home Exercise Plan [id number]    Consulted and Agree with Plan of Care Patient             Patient will benefit from skilled therapeutic intervention in order to improve the following deficits and impairments:     Visit Diagnosis: Muscle weakness (generalized)  Lack of coordination  Repeated falls  Unspecified lack of coordination  Other abnormalities of gait and mobility     Problem List Patient Active Problem List   Diagnosis Date Noted   Liver cirrhosis secondary to NASH ([place])    GERD (gastroesophageal reflux disease)    Pancytopenia (HCC)    Osteomyelitis of jaw 12/29/2020   Acute osteomyelitis of mandible 12/26/2020   S/P inguinal hernia repair 01/14/2020   At high risk for falls    S/P shoulder surgery 03/05/2018   Excessive daytime sleepiness 02/18/2018   Crohn's disease of colon with rectal bleeding ([place]) 02/18/2018   Blood in stool 02/18/2018   Iron deficiency anemia due to chronic blood loss 02/18/2018   Iron deficiency anemia due to sideropenic dysphagia 02/18/2018   Renal calculus, left 02/02/2018   History of sepsis 12/16/2017   Renal cyst, acquired, left 07/08/2017   Frequent falls 07/08/2017   Renal  lesion 05/28/2017   MSSA bacteremia 05/06/2017   Confusion 04/02/2017   Syncope 08/30/2016   Depression 04/30/2016   Memory loss 04/30/2016   OSA on CPAP 10/31/2015   Nonepileptic episode ([place]) 04/25/2015   Seizure ([place]) 04/04/2013   Abnormal EKG 03/12/2013    DOE (dyspnea on exertion) 03/12/2013   Complex partial seizure ([place]) 04/19/2012   DM2 (diabetes mellitus, type 2) ([place]) 09/25/2011   OTHER DYSPHAGIA 03/27/2009   Cough 11/10/2008   GERD 12/07/2007   COLONIC POLYPS, ADENOMATOUS, HX OF 12/07/2007   EXTERNAL HEMORRHOIDS 09/11/2007   CROHN'S DISEASE, LARGE AND SMALL INTESTINES 09/11/2007   OSTEOPOROSIS 09/11/2007   HYPERLIPIDEMIA NEC/NOS 02/27/2007   Essential hypertension 02/27/2007    [name] [name], PT 02/19/21 1:06 PM   [facility] [address] [place], [place], [address] Phone: [phone]   Fax:  [phone]  Name: [name] [name] MRN: [id number] Date of Birth: 1960-07-21

## 2021-02-21 DIAGNOSIS — D485 Neoplasm of uncertain behavior of skin: Secondary | ICD-10-CM | POA: Diagnosis not present

## 2021-02-22 DIAGNOSIS — R1313 Dysphagia, pharyngeal phase: Secondary | ICD-10-CM | POA: Diagnosis not present

## 2021-02-22 DIAGNOSIS — G4733 Obstructive sleep apnea (adult) (pediatric): Secondary | ICD-10-CM | POA: Diagnosis not present

## 2021-02-23 ENCOUNTER — Ambulatory Visit: Payer: Medicare Other | Attending: Family Medicine | Admitting: Physical Therapy

## 2021-02-23 ENCOUNTER — Encounter: Payer: Self-pay | Admitting: Physical Therapy

## 2021-02-23 ENCOUNTER — Other Ambulatory Visit: Payer: Self-pay

## 2021-02-23 DIAGNOSIS — R296 Repeated falls: Secondary | ICD-10-CM | POA: Diagnosis not present

## 2021-02-23 DIAGNOSIS — M6281 Muscle weakness (generalized): Secondary | ICD-10-CM | POA: Diagnosis not present

## 2021-02-23 DIAGNOSIS — R2689 Other abnormalities of gait and mobility: Secondary | ICD-10-CM | POA: Diagnosis not present

## 2021-02-23 DIAGNOSIS — R279 Unspecified lack of coordination: Secondary | ICD-10-CM

## 2021-02-23 NOTE — Therapy (Signed)
[facility] [address] [place], [place], [address] Phone: [phone]   Fax:  [phone]  Physical Therapy Treatment  Patient Details  Name: [name] [name] MRN: [id number] Date of Birth: December 07, 1960 Referring Provider (PT): [name], MD   Encounter Date: 02/23/2021   PT End of Session - 02/23/21 0844     Visit Number 6    Date for PT Re-Evaluation 03/27/21    Authorization Type UHC Medicare    Authorization - Visit Number 6    Authorization - Number of Visits 60    PT Start Time 0845    PT Stop Time 0932    PT Time Calculation (min) 47 min    Equipment Utilized During Treatment Gait belt    Activity Tolerance Patient tolerated treatment well    Behavior During Therapy [facility] for tasks assessed/performed             Past Medical History:  Diagnosis Date   Adenomatous colon polyp 10/1999   Anxiety    Arthritis    neck, yoga helps.   Cataract    Complex partial seizure ([place])    last seizure was 08-30-2018   Crohn's disease of small and large intestines ([place]) 1999   Depression    Diabetes mellitus    no meds at this time 10-24-17   Esophageal stricture    External hemorrhoids    GERD (gastroesophageal reflux disease)    Glaucoma    History of kidney stones    3 large stones still present   Hyperlipemia    Hypertension    resolved with weight loss    Inguinal hernia, bilateral 12/2019   Liver cirrhosis secondary to NASH ([place])    Migraines    Osteoporosis    PONV (postoperative nausea and vomiting)    PTSD (post-traumatic stress disorder)    Renal cyst, acquired, left 07/08/2017   Skin cancer    squamous cell multiple; [place]; followed every 3 months.   Sleep apnea    CPAP machine, uses nightly   Staphylococcus aureus bacteremia with sepsis ([place]) 05/28/2017   MRSA 2012    Past Surgical History:  Procedure Laterality Date   CATARACT EXTRACTION Bilateral    COLONOSCOPY     DEBRIDEMENT  MANDIBLE N/A 12/07/2020   Procedure: DEBRIDEMENT OF MANDIBLE;  Surgeon: [name], DMD;  Location: [place];  Service: Oral Surgery;  Laterality: N/A;   ELBOW SURGERY  2012   elbow MRSA infection    EYE SURGERY     cataracts removed, /w IOL   GROSS TEETH DEBRIDEMENT N/A 12/29/2020   Procedure: MANDIBULAR  DEBRIDEMENT;  Surgeon: [name], DMD;  Location: [place];  Service: Dentistry;  Laterality: N/A;   INCISION AND DRAINAGE ABSCESS N/A 12/07/2020   Procedure: INCISION AND DRAINAGE MANDIBULAR ABSCESS;  Surgeon: [name], DMD;  Location: [place];  Service: Oral Surgery;  Laterality: N/A;   INGUINAL HERNIA REPAIR Right 01/14/2020   Procedure: OPEN RIGHT HERNIA REPAIR INGUINAL WITH MESH;  Surgeon: [name], MD;  [facility];  Service: General;  Laterality: Right;   INSERTION OF MESH N/A 07/01/2016   Procedure: INSERTION OF MESH;  Surgeon: [name], MD;  Location: [place];  Service: General;  Laterality: N/A;   IR URETERAL STENT LEFT NEW ACCESS W/O SEP NEPHROSTOMY CATH  02/02/2018   NEPHROLITHOTOMY Left 02/02/2018   Procedure: NEPHROLITHOTOMY PERCUTANEOUS;  Surgeon: [name], MD;  [facility];  Service: Urology;  Laterality: Left;   POLYPECTOMY     SCALP LACERATION REPAIR Right 10/16/2017   From fall/staples   SHOULDER ARTHROSCOPY Right 03/05/2018   Procedure: ARTHROSCOPY SHOULDER AND OPEN DISTAL CLAVICLE EXCISION;  Surgeon: [name], MD;  Location: [place];  Service: Orthopedics;  Laterality: Right;   SHOULDER SURGERY     SINUS SURGERY WITH INSTATRAK     TEE WITHOUT CARDIOVERSION N/A 05/09/2017   Procedure: TRANSESOPHAGEAL ECHOCARDIOGRAM (TEE);  Surgeon: [name], MD;  Location: [facility];  Service: Cardiovascular;  Laterality: N/A;   TOOTH EXTRACTION N/A 12/07/2020   Procedure: DENTAL EXTRACTIONS OF TEETH TWENTY TO THIRTY;  Surgeon: [name], DMD;  Location: [place];  Service: Oral Surgery;  Laterality: N/A;   TOOTH EXTRACTION N/A  12/29/2020   Procedure: DENTAL RESTORATION/EXTRACTIONS;  Surgeon: [name], DMD;  Location: [place];  Service: Dentistry;  Laterality: N/A;   TRANSTHORACIC ECHOCARDIOGRAM  02/22/2011   EF 55-65%; increased pattern of LVH with mild conc hypertrophy, abnormal relaxation & increased filling pressure (grade 2 diastolic dysfunction); atrial septum thickened (lipomatous hypertrophy)   UMBILICAL HERNIA REPAIR N/A 07/01/2016   Procedure: UMBILICAL HERNIA REPAIR WITH MESH;  Surgeon: [name], MD;  Location: [place];  Service: General;  Laterality: N/A;   UPPER GASTROINTESTINAL ENDOSCOPY     VASECTOMY      There were no vitals filed for this visit.   Subjective Assessment - 02/23/21 0844     Subjective No falls.  Doing well.  I think I did ok after last time.  I don't remember.    Pertinent History on disability, has memory problems, liver cirrhosis, Chrons, osteoporosis with multiple fractures including C6, thrombocytopenia, gluacoma (loss of peripheral vision).  Has lost 75lb through walking but concerned about safety    Limitations Walking    How long can you walk comfortably? 2.5 miles with walking sticks    Diagnostic tests C6 fracture and ligamentum flavum tear - non-surgical    Patient Stated Goals work on coming down and up stairs, balance    Currently in Pain? No/denies                               [facility] Adult PT Treatment/Exercise - 02/23/21 0001       Ambulation/Gait   Ambulation/Gait Yes    Ambulation/Gait Assistance 5: Supervision    Assistive device None    Gait Pattern Decreased arm swing - right;Decreased arm swing - left    Ambulation Surface Level;Paved;Grass;Outdoor;Unlevel    Stairs Yes    Stairs Assistance 5: Supervision    Number of Stairs 8    Height of Stairs 6    Curb 5: Supervision;4: Min assist;6: Modified independent (Device/increase time)   needed CGA for step down from grass off curb   Pre-Gait Activities 6' outdoor walk test with  gait belt, VC to avoid crossing arms to keep arms available for arm swing and safety      Exercises   Exercises Knee/Hip      Knee/Hip Exercises: Machines for Strengthening   Total Gym Leg Press 150lb 2x10 reps bil LEs                 Balance Exercises - 02/23/21 0001       Balance Exercises: Standing   Tandem Stance Eyes open;Upper extremity support 1;2 reps;20 secs   required CGA and min A   Tandem Gait Forward;Retro   along  edge of TM, single UE with PT providing CGA/min A   Other Standing Exercises fwd/bwd amb shuttles x 2 laps/3 discs, out and back turns around 6 discs (dizzy with this), box steps x 5 reps each way (fatigue by last 2 reps), gait with change of pace cued by PT x 80'                 PT Short Term Goals - 02/23/21 1035       PT SHORT TERM GOAL #1   Title Patient will complete 3 minute walk test using LRAD with only supervision to indicate decreased fall risk.    Baseline performed 3' on outdoor level surface without AD with supervision 02/23/21, no LOB, some days are better than others per Pt    Status On-going      PT SHORT TERM GOAL #2   Title Pt will verbalize and demo safe strategies for stair pattern (step to for descending) and gait patterns/pace (slow down) without AD    Status Achieved      PT SHORT TERM GOAL #3   Title PT and Pt will explore gait with walker and make decisions about appropriateness of use for improved safety.    Baseline using walking sticks and has had no falls since starting PT    Status Achieved      PT SHORT TERM GOAL #4   Title Pt ind with initial HEP    Status On-going               PT Long Term Goals - 02/23/21 1036       PT LONG TERM GOAL #1   Title Improve 5x sit to stand to </= 14 sec.    Status New      PT LONG TERM GOAL #2   Title Pt will demo up/down stairs x 3 rounds (4 6 inch steps) using bil UEs and safe strategy to demo improved safety on stairs    Status On-going      PT LONG TERM  GOAL #3   Title Pt will demo safety/no LOB with community ambulation for 6' walk test, curb up/down and grassy surface with supervision using least restrictive AD or no AD.    Baseline 6' walk test met but LOB stepping off grassy curb    Status On-going      PT LONG TERM GOAL #4   Title Pt will report at least 50% more confidence in balance with daily tasks    Status New      PT LONG TERM GOAL #5   Title Ind with advanced HEP and understand how to safely progress    Status New                   Plan - 02/23/21 1031     Clinical Impression Statement Pt is making excellent progress toward safety using walking sticks and walking 3x daily to reach approx 2-3 miles most days of the week.  He has had no falls since starting PT.  He demo'd safe strategy ascending and descending stairs today using single rail and step to pattern on way down.  He has met several STGs and is making progress toward remaining STGs and LTGs.  Pt participated in 6' walk test outdoors with community obstacles such as curbs, grassy surface, inclines and declines on grass and level surface today with gait belt.  Greatest challenge was stepping off curb from grass during which Pt needed min A  for control and safety.  Pt becomes dizzy intermittently throughout sessions especially with 180 deg turns.  Pt had several LOB as he grew fatigued during retro gait, box stepping, and tandem stance.  He needs occassional VC for coordination during multi-step challenges due to some memory deficits.  Pt increased leg press resistance to 150lb today and grew fatigued with 2x10.  PT providing varied level of support for safety using gait belt and close supervision up to min A with all gait and balance tasks.  Continue along POC with ongoing assessment.    Comorbidities seizures, memory deficits, osteoporosis with Hx of multiple fractures    PT Frequency 2x / week    PT Duration 8 weeks    PT Treatment/Interventions ADLs/Self Care Home  Management;Gait training;Stair training;Functional mobility training;Therapeutic activities;Therapeutic exercise;Balance training;Neuromuscular re-education;Manual techniques;Patient/family education;Passive range of motion    PT Next Visit Plan gait training; static and dynamic balance; reciprocal pattern training (opposite UE/LE movement; trunk dissociation)    PT Home Exercise Plan [id number]    Consulted and Agree with Plan of Care Patient             Patient will benefit from skilled therapeutic intervention in order to improve the following deficits and impairments:     Visit Diagnosis: Muscle weakness (generalized)  Lack of coordination  Repeated falls     Problem List Patient Active Problem List   Diagnosis Date Noted   Liver cirrhosis secondary to NASH ([place])    GERD (gastroesophageal reflux disease)    Pancytopenia (H[facility])    Osteomyelitis of jaw 12/29/2020   Acute osteomyelitis of mandible 12/26/2020   S/P inguinal hernia repair 01/14/2020   At high risk for falls    S/P shoulder surgery 03/05/2018   Excessive daytime sleepiness 02/18/2018   Crohn's disease of colon with rectal bleeding ([place]) 02/18/2018   Blood in stool 02/18/2018   Iron deficiency anemia due to chronic blood loss 02/18/2018   Iron deficiency anemia due to sideropenic dysphagia 02/18/2018   Renal calculus, left 02/02/2018   History of sepsis 12/16/2017   Renal cyst, acquired, left 07/08/2017   Frequent falls 07/08/2017   Renal lesion 05/28/2017   MSSA bacteremia 05/06/2017   Confusion 04/02/2017   Syncope 08/30/2016   Depression 04/30/2016   Memory loss 04/30/2016   OSA on CPAP 10/31/2015   Nonepileptic episode ([place]) 04/25/2015   Seizure ([place]) 04/04/2013   Abnormal EKG 03/12/2013   DOE (dyspnea on exertion) 03/12/2013   Complex partial seizure ([place]) 04/19/2012   DM2 (diabetes mellitus, type 2) ([place]) 09/25/2011   OTHER DYSPHAGIA 03/27/2009   Cough 11/10/2008   GERD 12/07/2007   COLONIC  POLYPS, ADENOMATOUS, HX OF 12/07/2007   EXTERNAL HEMORRHOIDS 09/11/2007   CROHN'S DISEASE, LARGE AND SMALL INTESTINES 09/11/2007   OSTEOPOROSIS 09/11/2007   HYPERLIPIDEMIA NEC/NOS 02/27/2007   Essential hypertension 02/27/2007    Tieara Flitton, PT 02/23/21 10:40 AM  [facility] [address] [place], [place], [address] Phone: [phone]   Fax:  [phone]  Name: [name] [name] MRN: [id number] Date of Birth: 08-Jul-1960

## 2021-02-27 ENCOUNTER — Other Ambulatory Visit: Payer: Self-pay

## 2021-02-27 ENCOUNTER — Ambulatory Visit: Payer: Medicare Other | Admitting: Physical Therapy

## 2021-02-27 ENCOUNTER — Encounter: Payer: Self-pay | Admitting: Physical Therapy

## 2021-02-27 DIAGNOSIS — M6281 Muscle weakness (generalized): Secondary | ICD-10-CM | POA: Diagnosis not present

## 2021-02-27 DIAGNOSIS — R279 Unspecified lack of coordination: Secondary | ICD-10-CM

## 2021-02-27 DIAGNOSIS — R296 Repeated falls: Secondary | ICD-10-CM

## 2021-02-27 DIAGNOSIS — R2689 Other abnormalities of gait and mobility: Secondary | ICD-10-CM

## 2021-02-27 NOTE — Therapy (Signed)
[facility] [address], [place], [place], [address] Phone: [phone]   Fax:  [phone]  Physical Therapy Treatment  Patient Details  Name: [name] [name] MRN: [id number] Date of Birth: [birth year] Referring Provider (PT): [name], MD   Encounter Date: 02/27/2021   PT End of Session - 02/27/21 1225     Visit Number 7    Date for PT Re-Evaluation 03/27/21    Authorization Type UHC Medicare    Authorization - Visit Number 7    Authorization - Number of Visits 60    PT Start Time 8676    PT Stop Time 1225    PT Time Calculation (min) 40 min    Equipment Utilized During Treatment Gait belt    Activity Tolerance Patient tolerated treatment well    Behavior During Therapy [facility] for tasks assessed/performed             Past Medical History:  Diagnosis Date   Adenomatous colon polyp 10/1999   Anxiety    Arthritis    neck, yoga helps.   Cataract    Complex partial seizure ([place])    last seizure was 08-30-2018   Crohn's disease of small and large intestines ([place]) 1999   Depression    Diabetes mellitus    no meds at this time 10-24-17   Esophageal stricture    External hemorrhoids    GERD (gastroesophageal reflux disease)    Glaucoma    History of kidney stones    3 large stones still present   Hyperlipemia    Hypertension    resolved with weight loss    Inguinal hernia, bilateral 12/2019   Liver cirrhosis secondary to NASH ([place])    Migraines    Osteoporosis    PONV (postoperative nausea and vomiting)    PTSD (post-traumatic stress disorder)    Renal cyst, acquired, left 07/08/2017   Skin cancer    squamous cell multiple; [place]; followed every 3 months.   Sleep apnea    CPAP machine, uses nightly   Staphylococcus aureus bacteremia with sepsis ([place]) 05/28/2017   MRSA 2012    Past Surgical History:  Procedure Laterality Date   CATARACT EXTRACTION Bilateral    COLONOSCOPY     DEBRIDEMENT  MANDIBLE N/A 12/07/2020   Procedure: DEBRIDEMENT OF MANDIBLE;  Surgeon: [name], DMD;  Location: [place];  Service: Oral Surgery;  Laterality: N/A;   ELBOW SURGERY  2012   elbow MRSA infection    EYE SURGERY     cataracts removed, /w IOL   GROSS TEETH DEBRIDEMENT N/A 12/29/2020   Procedure: MANDIBULAR  DEBRIDEMENT;  Surgeon: [name], DMD;  Location: [place];  Service: Dentistry;  Laterality: N/A;   INCISION AND DRAINAGE ABSCESS N/A 12/07/2020   Procedure: INCISION AND DRAINAGE MANDIBULAR ABSCESS;  Surgeon: [name], DMD;  Location: [place];  Service: Oral Surgery;  Laterality: N/A;   INGUINAL HERNIA REPAIR Right 01/14/2020   Procedure: OPEN RIGHT HERNIA REPAIR INGUINAL WITH MESH;  Surgeon: [name], MD;  [facility];  Service: General;  Laterality: Right;   INSERTION OF MESH N/A 07/01/2016   Procedure: INSERTION OF MESH;  Surgeon: [name], MD;  Location: [place];  Service: General;  Laterality: N/A;   IR URETERAL STENT LEFT NEW ACCESS W/O SEP NEPHROSTOMY CATH  02/02/2018   NEPHROLITHOTOMY Left 02/02/2018   Procedure: NEPHROLITHOTOMY PERCUTANEOUS;  Surgeon: [name], MD;  [facility];  Service: Urology;  Laterality: Left;   POLYPECTOMY     SCALP LACERATION REPAIR Right 10/16/2017   From fall/staples   SHOULDER ARTHROSCOPY Right 03/05/2018   Procedure: ARTHROSCOPY SHOULDER AND OPEN DISTAL CLAVICLE EXCISION;  Surgeon: [name], MD;  Location: [place];  Service: Orthopedics;  Laterality: Right;   SHOULDER SURGERY     SINUS SURGERY WITH INSTATRAK     TEE WITHOUT CARDIOVERSION N/A 05/09/2017   Procedure: TRANSESOPHAGEAL ECHOCARDIOGRAM (TEE);  Surgeon: [name], MD;  Location: [facility];  Service: Cardiovascular;  Laterality: N/A;   TOOTH EXTRACTION N/A 12/07/2020   Procedure: DENTAL EXTRACTIONS OF TEETH TWENTY TO THIRTY;  Surgeon: [name], DMD;  Location: [place];  Service: Oral Surgery;  Laterality: N/A;   TOOTH EXTRACTION N/A  12/29/2020   Procedure: DENTAL RESTORATION/EXTRACTIONS;  Surgeon: [name], DMD;  Location: [place];  Service: Dentistry;  Laterality: N/A;   TRANSTHORACIC ECHOCARDIOGRAM  02/22/2011   EF 55-65%; increased pattern of LVH with mild conc hypertrophy, abnormal relaxation & increased filling pressure (grade 2 diastolic dysfunction); atrial septum thickened (lipomatous hypertrophy)   UMBILICAL HERNIA REPAIR N/A 07/01/2016   Procedure: UMBILICAL HERNIA REPAIR WITH MESH;  Surgeon: [name], MD;  Location: [place];  Service: General;  Laterality: N/A;   UPPER GASTROINTESTINAL ENDOSCOPY     VASECTOMY      There were no vitals filed for this visit.   Subjective Assessment - 02/27/21 1148     Subjective Did well after last time.  No falls.  the walking sticks really help.    Pertinent History on disability, has memory problems, liver cirrhosis, Chrons, osteoporosis with multiple fractures including C6, thrombocytopenia, gluacoma (loss of peripheral vision).  Has lost 75lb through walking but concerned about safety    Limitations Walking    How long can you walk comfortably? 2.5 miles with walking sticks    Diagnostic tests C6 fracture and ligamentum flavum tear - non-surgical    Patient Stated Goals work on coming down and up stairs, balance    Currently in Pain? No/denies                [facility] PT Assessment - 02/27/21 0001       Standardized Balance Assessment   Five times sit to stand comments  15 sec no UE use                           [facility] Adult PT Treatment/Exercise - 02/27/21 0001       Ambulation/Gait   Stairs Yes    Stairs Assistance 5: Supervision    Stair Management Technique One rail Left;Alternating pattern;Step to pattern    Number of Stairs 16    Height of Stairs 6      Exercises   Exercises Knee/Hip;Shoulder      Knee/Hip Exercises: Machines for Strengthening   Total Gym Leg Press 150lb 2x10 reps bil LEs      Knee/Hip Exercises:  Standing   Walking with Sports Cord 15lb sidestepping Rt/Lt x 3 each, 20lb bwd x 3, close CGA/supervision    Gait Training kettlebell farmer carry x 80' each way, holding in Rt and Lt    Other Standing Knee Exercises march taps 6" step alt x 20      Shoulder Exercises: Power [employer] 15 reps    Row Limitations 40lb bil then single arm Rt/Lt circuit, standing    Other Power Sports coach press 35lb  1x10 each way                 Balance Exercises - 02/27/21 0001       Balance Exercises: Standing   Sidestepping Upper extremity support;Theraband   yellow, x 3 laps along counter                PT Short Term Goals - 02/27/21 1337       PT SHORT TERM GOAL #1   Title Patient will complete 3 minute walk test using LRAD with only supervision to indicate decreased fall risk.    Status Achieved      PT SHORT TERM GOAL #2   Title Pt will verbalize and demo safe strategies for stair pattern (step to for descending) and gait patterns/pace (slow down) without AD    Status Achieved      PT SHORT TERM GOAL #3   Title PT and Pt will explore gait with walker and make decisions about appropriateness of use for improved safety.    Baseline using walking sticks and has had no falls since starting PT    Status Achieved      PT SHORT TERM GOAL #4   Title Pt ind with initial HEP    Status Achieved               PT Long Term Goals - 02/27/21 1338       PT LONG TERM GOAL #1   Title Improve 5x sit to stand to </= 14 sec.    Baseline 15 sec on 02/27/21    Status On-going      PT LONG TERM GOAL #2   Title Pt will demo up/down stairs x 3 rounds (4 6 inch steps) using bil UEs and safe strategy to demo improved safety on stairs    Status Achieved      PT LONG TERM GOAL #3   Title Pt will demo safety/no LOB with community ambulation for 6' walk test, curb up/down and grassy surface with supervision using least restrictive AD or no AD.    Baseline 6' walk test met  but LOB stepping off grassy curb    Status On-going      PT LONG TERM GOAL #4   Title Pt will report at least 50% more confidence in balance with daily tasks    Status On-going      PT LONG TERM GOAL #5   Title Ind with advanced HEP and understand how to safely progress    Status On-going                   Plan - 02/27/21 1334     Clinical Impression Statement Pt was greatly challenged today with resisted walking bwd and sidestepping.  He performed more weighted trunk challenges today with rows, ambulation farmer carries, palloff presses and resisted walking.  He did report increased experience in dizziness as compared to previous visits but was able to recover with short transitions between exercises.  He met stair goal today for consistent performance with safe strategy. Pt is pleased to report he has not fallen since starting PT and continues to use his walking sticks for outdoor exercise walks.  5x sit to stand has reduced by 2 sec, coming 1 sec within LTG today.  Continue along POC.  Gait belt and close supervision/CGA used for safety throughout session.    Comorbidities seizures, memory deficits, osteoporosis with Hx of multiple fractures    Rehab Potential  Good    PT Frequency 2x / week    PT Duration 8 weeks    PT Treatment/Interventions ADLs/Self Care Home Management;Gait training;Stair training;Functional mobility training;Therapeutic activities;Therapeutic exercise;Balance training;Neuromuscular re-education;Manual techniques;Patient/family education;Passive range of motion    PT Next Visit Plan gait training; static and dynamic balance; reciprocal pattern training (opposite UE/LE movement; trunk dissociation)    PT Home Exercise Plan [id number]    Consulted and Agree with Plan of Care Patient             Patient will benefit from skilled therapeutic intervention in order to improve the following deficits and impairments:     Visit Diagnosis: Muscle weakness  (generalized)  Lack of coordination  Repeated falls  Other abnormalities of gait and mobility     Problem List Patient Active Problem List   Diagnosis Date Noted   Liver cirrhosis secondary to NASH ([place])    GERD (gastroesophageal reflux disease)    Pancytopenia ([facility])    Osteomyelitis of jaw 12/29/2020   Acute osteomyelitis of mandible 12/26/2020   S/P inguinal hernia repair 01/14/2020   At high risk for falls    S/P shoulder surgery 03/05/2018   Excessive daytime sleepiness 02/18/2018   Crohn's disease of colon with rectal bleeding ([place]) 02/18/2018   Blood in stool 02/18/2018   Iron deficiency anemia due to chronic blood loss 02/18/2018   Iron deficiency anemia due to sideropenic dysphagia 02/18/2018   Renal calculus, left 02/02/2018   History of sepsis 12/16/2017   Renal cyst, acquired, left 07/08/2017   Frequent falls 07/08/2017   Renal lesion 05/28/2017   MSSA bacteremia 05/06/2017   Confusion 04/02/2017   Syncope 08/30/2016   Depression 04/30/2016   Memory loss 04/30/2016   OSA on CPAP 10/31/2015   Nonepileptic episode ([place]) 04/25/2015   Seizure ([place]) 04/04/2013   Abnormal EKG 03/12/2013   DOE (dyspnea on exertion) 03/12/2013   Complex partial seizure ([place]) 04/19/2012   DM2 (diabetes mellitus, type 2) ([place]) 09/25/2011   OTHER DYSPHAGIA 03/27/2009   Cough 11/10/2008   GERD 12/07/2007   COLONIC POLYPS, ADENOMATOUS, HX OF 12/07/2007   EXTERNAL HEMORRHOIDS 09/11/2007   CROHN'S DISEASE, LARGE AND SMALL INTESTINES 09/11/2007   OSTEOPOROSIS 09/11/2007   HYPERLIPIDEMIA NEC/NOS 02/27/2007   Essential hypertension 02/27/2007     , PT 02/27/21 1:39 PM   [facility] [address] [place], [place], [address] Phone: [phone]   Fax:  [phone]  Name: [name] [name] MRN: [id number] Date of Birth: 03-01-1961

## 2021-02-28 DIAGNOSIS — R030 Elevated blood-pressure reading, without diagnosis of hypertension: Secondary | ICD-10-CM | POA: Diagnosis not present

## 2021-02-28 DIAGNOSIS — S12591A Other nondisplaced fracture of sixth cervical vertebra, initial encounter for closed fracture: Secondary | ICD-10-CM | POA: Diagnosis not present

## 2021-03-01 ENCOUNTER — Other Ambulatory Visit: Payer: Self-pay

## 2021-03-01 ENCOUNTER — Ambulatory Visit: Payer: Medicare Other | Admitting: Physical Therapy

## 2021-03-01 DIAGNOSIS — R296 Repeated falls: Secondary | ICD-10-CM

## 2021-03-01 DIAGNOSIS — M6281 Muscle weakness (generalized): Secondary | ICD-10-CM

## 2021-03-01 DIAGNOSIS — R2689 Other abnormalities of gait and mobility: Secondary | ICD-10-CM

## 2021-03-01 DIAGNOSIS — R279 Unspecified lack of coordination: Secondary | ICD-10-CM

## 2021-03-01 NOTE — Therapy (Signed)
[facility] [address] [place], [place], [address] Phone: [phone]   Fax:  [phone]  Physical Therapy Treatment  Patient Details  Name: [name] [name] MRN: [id number] Date of Birth: 21-Jul-1960 Referring Provider (PT): [name], MD   Encounter Date: 03/01/2021   PT End of Session - 03/01/21 1234     Visit Number 8    Date for PT Re-Evaluation 03/27/21    Authorization Type UHC Medicare    Authorization - Visit Number 8    Authorization - Number of Visits 60    PT Start Time 1025    PT Stop Time 1230    PT Time Calculation (min) 45 min    Activity Tolerance Patient tolerated treatment well             Past Medical History:  Diagnosis Date   Adenomatous colon polyp 10/1999   Anxiety    Arthritis    neck, yoga helps.   Cataract    Complex partial seizure ([place])    last seizure was 08-30-2018   Crohn's disease of small and large intestines ([place]) 1999   Depression    Diabetes mellitus    no meds at this time 10-24-17   Esophageal stricture    External hemorrhoids    GERD (gastroesophageal reflux disease)    Glaucoma    History of kidney stones    3 large stones still present   Hyperlipemia    Hypertension    resolved with weight loss    Inguinal hernia, bilateral 12/2019   Liver cirrhosis secondary to NASH ([place])    Migraines    Osteoporosis    PONV (postoperative nausea and vomiting)    PTSD (post-traumatic stress disorder)    Renal cyst, acquired, left 07/08/2017   Skin cancer    squamous cell multiple; [place]; followed every 3 months.   Sleep apnea    CPAP machine, uses nightly   Staphylococcus aureus bacteremia with sepsis ([place]) 05/28/2017   MRSA 2012    Past Surgical History:  Procedure Laterality Date   CATARACT EXTRACTION Bilateral    COLONOSCOPY     DEBRIDEMENT MANDIBLE N/A 12/07/2020   Procedure: DEBRIDEMENT OF MANDIBLE;  Surgeon: [name], DMD;  Location: [place];  Service: Oral Surgery;  Laterality: N/A;   ELBOW SURGERY  2012   elbow MRSA infection    EYE SURGERY     cataracts removed, /w IOL   GROSS TEETH DEBRIDEMENT N/A 12/29/2020   Procedure: MANDIBULAR  DEBRIDEMENT;  Surgeon: [name], DMD;  Location: [place];  Service: Dentistry;  Laterality: N/A;   INCISION AND DRAINAGE ABSCESS N/A 12/07/2020   Procedure: INCISION AND DRAINAGE MANDIBULAR ABSCESS;  Surgeon: [name], DMD;  Location: [place];  Service: Oral Surgery;  Laterality: N/A;   INGUINAL HERNIA REPAIR Right 01/14/2020   Procedure: OPEN RIGHT HERNIA REPAIR INGUINAL WITH MESH;  Surgeon: [name], MD;  [facility];  Service: General;  Laterality: Right;   INSERTION OF MESH N/A 07/01/2016   Procedure: INSERTION OF MESH;  Surgeon: [name], MD;  Location: [place];  Service: General;  Laterality: N/A;   IR URETERAL STENT LEFT NEW ACCESS W/O SEP NEPHROSTOMY CATH  02/02/2018   NEPHROLITHOTOMY Left 02/02/2018   Procedure: NEPHROLITHOTOMY PERCUTANEOUS;  Surgeon: [name], MD;  [facility];  Service: Urology;  Laterality: Left;   POLYPECTOMY     SCALP LACERATION REPAIR Right 10/16/2017   From fall/staples  SHOULDER ARTHROSCOPY Right 03/05/2018   Procedure: ARTHROSCOPY SHOULDER AND OPEN DISTAL CLAVICLE EXCISION;  Surgeon: [name], MD;  Location: [place];  Service: Orthopedics;  Laterality: Right;   SHOULDER SURGERY     SINUS SURGERY WITH INSTATRAK     TEE WITHOUT CARDIOVERSION N/A 05/09/2017   Procedure: TRANSESOPHAGEAL ECHOCARDIOGRAM (TEE);  Surgeon: [name], MD;  Location: [facility];  Service: Cardiovascular;  Laterality: N/A;   TOOTH EXTRACTION N/A 12/07/2020   Procedure: DENTAL EXTRACTIONS OF TEETH TWENTY TO THIRTY;  Surgeon: [name], DMD;  Location: [place];  Service: Oral Surgery;  Laterality: N/A;   TOOTH EXTRACTION N/A 12/29/2020   Procedure: DENTAL RESTORATION/EXTRACTIONS;  Surgeon: [name], DMD;  Location: [place];   Service: Dentistry;  Laterality: N/A;   TRANSTHORACIC ECHOCARDIOGRAM  02/22/2011   EF 55-65%; increased pattern of LVH with mild conc hypertrophy, abnormal relaxation & increased filling pressure (grade 2 diastolic dysfunction); atrial septum thickened (lipomatous hypertrophy)   UMBILICAL HERNIA REPAIR N/A 07/01/2016   Procedure: UMBILICAL HERNIA REPAIR WITH MESH;  Surgeon: [name], MD;  Location: [place];  Service: General;  Laterality: N/A;   UPPER GASTROINTESTINAL ENDOSCOPY     VASECTOMY      There were no vitals filed for this visit.   Subjective Assessment - 03/01/21 1146     Subjective I almost fell getting out of the car stepping up onto curb.  Stopped moving and recovered balance.  This morning while walking, got dizzy.  Saw neurosurgeon yesterday and I have a cracked vertebra.  He says I have a lot of extra bone limiting my motion.   He will decide if I need surgery and says it would be very complicated.     Pertinent History on disability, has memory problems, liver cirrhosis, Chrons, osteoporosis with multiple fractures including C6, thrombocytopenia, gluacoma (loss of peripheral vision).  Has lost 75lb through walking but concerned about safety    Currently in Pain? Yes    Pain Score 5     Pain Location Neck                               [facility] Adult PT Treatment/Exercise - 03/01/21 0001       Neuro Re-ed    Neuro Re-ed Details  at the counter: side step, march walk; backwards wall 2 laps each      Knee/Hip Exercises: Stretches   Other Knee/Hip Stretches 2nd step hip flexor/gastroc stretch 5x right/left      Knee/Hip Exercises: Machines for Strengthening   Total Gym Leg Press 150lb 2x10 reps bil LEs      Knee/Hip Exercises: Standing   Heel Raises Both;10 reps      Shoulder Exercises: Power Tower   Row 20 reps    Row Limitations 35# straight and staggered stand    Other Power Tower Exercises palloff press 35lb 1x10 each way                        PT Short Term Goals - 02/27/21 1337       PT SHORT TERM GOAL #1   Title Patient will complete 3 minute walk test using LRAD with only supervision to indicate decreased fall risk.    Status Achieved      PT SHORT TERM GOAL #2   Title Pt will verbalize and demo safe strategies for stair pattern (step to for descending)  and gait patterns/pace (slow down) without AD    Status Achieved      PT SHORT TERM GOAL #3   Title PT and Pt will explore gait with walker and make decisions about appropriateness of use for improved safety.    Baseline using walking sticks and has had no falls since starting PT    Status Achieved      PT SHORT TERM GOAL #4   Title Pt ind with initial HEP    Status Achieved               PT Long Term Goals - 02/27/21 1338       PT LONG TERM GOAL #1   Title Improve 5x sit to stand to </= 14 sec.    Baseline 15 sec on 02/27/21    Status On-going      PT LONG TERM GOAL #2   Title Pt will demo up/down stairs x 3 rounds (4 6 inch steps) using bil UEs and safe strategy to demo improved safety on stairs    Status Achieved      PT LONG TERM GOAL #3   Title Pt will demo safety/no LOB with community ambulation for 6' walk test, curb up/down and grassy surface with supervision using least restrictive AD or no AD.    Baseline 6' walk test met but LOB stepping off grassy curb    Status On-going      PT LONG TERM GOAL #4   Title Pt will report at least 50% more confidence in balance with daily tasks    Status On-going      PT LONG TERM GOAL #5   Title Ind with advanced HEP and understand how to safely progress    Status On-going                   Plan - 03/01/21 1234     Clinical Impression Statement He reports some "close calls" earlier this morning with balance but was above to recover without falling.  The patient had difficulty with dynamic balance challenges particularly march/high step walk requiring counter support to self  stabilize.  CGA provided with gait belt for safety as well.  Periodic dizziness causes him to have to stand still briefly.  He reports general muscle fatigue post treatment session and no increase in dizziness.    Personal Factors and Comorbidities Comorbidity 1;Comorbidity 2;Comorbidity 3+;Behavior Pattern;Time since onset of injury/illness/exacerbation    Comorbidities seizures, memory deficits, osteoporosis with Hx of multiple fractures    Rehab Potential Good    PT Frequency 2x / week    PT Duration 8 weeks    PT Treatment/Interventions ADLs/Self Care Home Management;Gait training;Stair training;Functional mobility training;Therapeutic activities;Therapeutic exercise;Balance training;Neuromuscular re-education;Manual techniques;Patient/family education;Passive range of motion    PT Next Visit Plan gait training; static and dynamic balance; reciprocal pattern training (opposite UE/LE movement; trunk dissociation)    PT Home Exercise Plan [place]             Patient will benefit from skilled therapeutic intervention in order to improve the following deficits and impairments:  Decreased coordination, Decreased mobility, Postural dysfunction, Pain, Decreased safety awareness, Impaired perceived functional ability, Decreased balance, Decreased cognition, Impaired flexibility, Dizziness  Visit Diagnosis: Muscle weakness (generalized)  Lack of coordination  Repeated falls  Other abnormalities of gait and mobility     Problem List Patient Active Problem List   Diagnosis Date Noted   Liver cirrhosis secondary to NASH ([place])  GERD (gastroesophageal reflux disease)    Pancytopenia ([facility])    Osteomyelitis of jaw 12/29/2020   Acute osteomyelitis of mandible 12/26/2020   S/P inguinal hernia repair 01/14/2020   At high risk for falls    S/P shoulder surgery 03/05/2018   Excessive daytime sleepiness 02/18/2018   Crohn's disease of colon with rectal bleeding ([place]) 02/18/2018   Blood in  stool 02/18/2018   Iron deficiency anemia due to chronic blood loss 02/18/2018   Iron deficiency anemia due to sideropenic dysphagia 02/18/2018   Renal calculus, left 02/02/2018   History of sepsis 12/16/2017   Renal cyst, acquired, left 07/08/2017   Frequent falls 07/08/2017   Renal lesion 05/28/2017   MSSA bacteremia 05/06/2017   Confusion 04/02/2017   Syncope 08/30/2016   Depression 04/30/2016   Memory loss 04/30/2016   OSA on CPAP 10/31/2015   Nonepileptic episode ([place]) 04/25/2015   Seizure ([place]) 04/04/2013   Abnormal EKG 03/12/2013   DOE (dyspnea on exertion) 03/12/2013   Complex partial seizure ([place]) 04/19/2012   DM2 (diabetes mellitus, type 2) ([place]) 09/25/2011   OTHER DYSPHAGIA 03/27/2009   Cough 11/10/2008   GERD 12/07/2007   COLONIC POLYPS, ADENOMATOUS, HX OF 12/07/2007   EXTERNAL HEMORRHOIDS 09/11/2007   CROHN'S DISEASE, LARGE AND SMALL INTESTINES 09/11/2007   OSTEOPOROSIS 09/11/2007   HYPERLIPIDEMIA NEC/NOS 02/27/2007   Essential hypertension 02/27/2007   Ruben Im, PT 03/01/21 12:42 PM Phone: [phone] Fax: [phone]  Alvera Singh, PT 03/01/2021, 12:41 PM  [facility] [address] [place], [place], [address] Phone: [phone]   Fax:  [phone]  Name: [name] [name] MRN: [id number] Date of Birth: 1961/04/30

## 2021-03-05 ENCOUNTER — Encounter: Payer: Self-pay | Admitting: Physical Therapy

## 2021-03-05 ENCOUNTER — Other Ambulatory Visit: Payer: Self-pay

## 2021-03-05 ENCOUNTER — Ambulatory Visit: Payer: Medicare Other | Admitting: Physical Therapy

## 2021-03-05 DIAGNOSIS — R279 Unspecified lack of coordination: Secondary | ICD-10-CM

## 2021-03-05 DIAGNOSIS — M6281 Muscle weakness (generalized): Secondary | ICD-10-CM

## 2021-03-05 DIAGNOSIS — R2689 Other abnormalities of gait and mobility: Secondary | ICD-10-CM

## 2021-03-05 DIAGNOSIS — R296 Repeated falls: Secondary | ICD-10-CM

## 2021-03-05 NOTE — Therapy (Signed)
[facility] [address] [place], [place], [address] Phone: [phone]   Fax:  [phone]  Physical Therapy Treatment  Patient Details  Name: [name] [name] MRN: [id number] Date of Birth: 1960/12/11 Referring Provider (PT): [name], MD   Encounter Date: 03/05/2021   PT End of Session - 03/05/21 1054     Visit Number 9    Date for PT Re-Evaluation 03/27/21    Authorization Type UHC Medicare    Authorization - Visit Number 9    Authorization - Number of Visits 60    Progress Note Due on Visit 10    PT Start Time 1015    PT Stop Time 1055    PT Time Calculation (min) 40 min    Equipment Utilized During Treatment Gait belt    Activity Tolerance Patient tolerated treatment well    Behavior During Therapy [facility] for tasks assessed/performed             Past Medical History:  Diagnosis Date   Adenomatous colon polyp 10/1999   Anxiety    Arthritis    neck, yoga helps.   Cataract    Complex partial seizure ([place])    last seizure was 08-30-2018   Crohn's disease of small and large intestines ([place]) 1999   Depression    Diabetes mellitus    no meds at this time 10-24-17   Esophageal stricture    External hemorrhoids    GERD (gastroesophageal reflux disease)    Glaucoma    History of kidney stones    3 large stones still present   Hyperlipemia    Hypertension    resolved with weight loss    Inguinal hernia, bilateral 12/2019   Liver cirrhosis secondary to NASH ([place])    Migraines    Osteoporosis    PONV (postoperative nausea and vomiting)    PTSD (post-traumatic stress disorder)    Renal cyst, acquired, left 07/08/2017   Skin cancer    squamous cell multiple; [place]; followed every 3 months.   Sleep apnea    CPAP machine, uses nightly   Staphylococcus aureus bacteremia with sepsis ([place]) 05/28/2017   MRSA 2012    Past Surgical History:  Procedure Laterality Date   CATARACT EXTRACTION Bilateral     COLONOSCOPY     DEBRIDEMENT MANDIBLE N/A 12/07/2020   Procedure: DEBRIDEMENT OF MANDIBLE;  Surgeon: [name], DMD;  Location: [place];  Service: Oral Surgery;  Laterality: N/A;   ELBOW SURGERY  2012   elbow MRSA infection    EYE SURGERY     cataracts removed, /w IOL   GROSS TEETH DEBRIDEMENT N/A 12/29/2020   Procedure: MANDIBULAR  DEBRIDEMENT;  Surgeon: [name], DMD;  Location: [place];  Service: Dentistry;  Laterality: N/A;   INCISION AND DRAINAGE ABSCESS N/A 12/07/2020   Procedure: INCISION AND DRAINAGE MANDIBULAR ABSCESS;  Surgeon: [name], DMD;  Location: [place];  Service: Oral Surgery;  Laterality: N/A;   INGUINAL HERNIA REPAIR Right 01/14/2020   Procedure: OPEN RIGHT HERNIA REPAIR INGUINAL WITH MESH;  Surgeon: [name], MD;  [facility];  Service: General;  Laterality: Right;   INSERTION OF MESH N/A 07/01/2016   Procedure: INSERTION OF MESH;  Surgeon: [name], MD;  Location: [place];  Service: General;  Laterality: N/A;   IR URETERAL STENT LEFT NEW ACCESS W/O SEP NEPHROSTOMY CATH  02/02/2018   NEPHROLITHOTOMY Left 02/02/2018   Procedure: NEPHROLITHOTOMY PERCUTANEOUS;  Surgeon: Karsten Ro,  Elta Guadeloupe, MD;  [facility];  Service: Urology;  Laterality: Left;   POLYPECTOMY     SCALP LACERATION REPAIR Right 10/16/2017   From fall/staples   SHOULDER ARTHROSCOPY Right 03/05/2018   Procedure: ARTHROSCOPY SHOULDER AND OPEN DISTAL CLAVICLE EXCISION;  Surgeon: [name], MD;  Location: [place];  Service: Orthopedics;  Laterality: Right;   SHOULDER SURGERY     SINUS SURGERY WITH INSTATRAK     TEE WITHOUT CARDIOVERSION N/A 05/09/2017   Procedure: TRANSESOPHAGEAL ECHOCARDIOGRAM (TEE);  Surgeon: [name], MD;  Location: [facility];  Service: Cardiovascular;  Laterality: N/A;   TOOTH EXTRACTION N/A 12/07/2020   Procedure: DENTAL EXTRACTIONS OF TEETH TWENTY TO THIRTY;  Surgeon: [name], DMD;  Location: [place];  Service: Oral Surgery;   Laterality: N/A;   TOOTH EXTRACTION N/A 12/29/2020   Procedure: DENTAL RESTORATION/EXTRACTIONS;  Surgeon: [name], DMD;  Location: [place];  Service: Dentistry;  Laterality: N/A;   TRANSTHORACIC ECHOCARDIOGRAM  02/22/2011   EF 55-65%; increased pattern of LVH with mild conc hypertrophy, abnormal relaxation & increased filling pressure (grade 2 diastolic dysfunction); atrial septum thickened (lipomatous hypertrophy)   UMBILICAL HERNIA REPAIR N/A 07/01/2016   Procedure: UMBILICAL HERNIA REPAIR WITH MESH;  Surgeon: [name], MD;  Location: [place];  Service: General;  Laterality: N/A;   UPPER GASTROINTESTINAL ENDOSCOPY     VASECTOMY      There were no vitals filed for this visit.   Subjective Assessment - 03/05/21 1021     Subjective I walked 2 miles this morning and it made my dizziness worse.    How long can you walk comfortably? 2.5 miles with walking sticks    Patient Stated Goals work on coming down and up stairs, balance                               [facility] Adult PT Treatment/Exercise - 03/05/21 0001       Exercises   Exercises Lumbar;Shoulder;Knee/Hip      Knee/Hip Exercises: Machines for Strengthening   Total Gym Leg Press 150lb 2x10 reps bil LEs      Knee/Hip Exercises: Standing   Hip Flexion Both;20 reps;Knee bent    Hip Flexion Limitations tap 6" step alt    Forward Step Up 1 set;10 reps;Step Height: 6"    Forward Step Up Limitations attempted w/o UEs, needed min A for LOB    Step Down Both;1 set;10 reps;Hand Hold: 1;Step Height: 4"    Gait Training 55' farmer carry 10lb Rt/Lt each                 Balance Exercises - 03/05/21 0001       Balance Exercises: Standing   Tandem Stance Eyes open;1 rep;20 secs   min A   Stepping Strategy Lateral   1x10 each over yardstick, dizzy with this   Sidestepping 2 reps   across carpet, gait belt   Other Standing Exercises retro gait x 1 lap along carpet                  PT Short  Term Goals - 02/27/21 1337       PT SHORT TERM GOAL #1   Title Patient will complete 3 minute walk test using LRAD with only supervision to indicate decreased fall risk.    Status Achieved      PT SHORT TERM GOAL #2   Title Pt will verbalize  and demo safe strategies for stair pattern (step to for descending) and gait patterns/pace (slow down) without AD    Status Achieved      PT SHORT TERM GOAL #3   Title PT and Pt will explore gait with walker and make decisions about appropriateness of use for improved safety.    Baseline using walking sticks and has had no falls since starting PT    Status Achieved      PT SHORT TERM GOAL #4   Title Pt ind with initial HEP    Status Achieved               PT Long Term Goals - 02/27/21 1338       PT LONG TERM GOAL #1   Title Improve 5x sit to stand to </= 14 sec.    Baseline 15 sec on 02/27/21    Status On-going      PT LONG TERM GOAL #2   Title Pt will demo up/down stairs x 3 rounds (4 6 inch steps) using bil UEs and safe strategy to demo improved safety on stairs    Status Achieved      PT LONG TERM GOAL #3   Title Pt will demo safety/no LOB with community ambulation for 6' walk test, curb up/down and grassy surface with supervision using least restrictive AD or no AD.    Baseline 6' walk test met but LOB stepping off grassy curb    Status On-going      PT LONG TERM GOAL #4   Title Pt will report at least 50% more confidence in balance with daily tasks    Status On-going      PT LONG TERM GOAL #5   Title Ind with advanced HEP and understand how to safely progress    Status On-going                   Plan - 03/05/21 1104     Clinical Impression Statement Pt grew fatigued within reps of most exercises today demo'ing loss of coordination and balance with fatigue for final reps within sets.  He needed min A for balance with achieving tandem stance but was able to hold x 20 sec each way with occassional CGA to recover  center of gravity.  He had walked 2 miles prior to session so arrived with heightened dizziness which was monitored closely throughout appointment.  PT advised safety over goal of more walking today given fatigue displayed within session.  Continue along POC.    Comorbidities seizures, memory deficits, osteoporosis with Hx of multiple fractures    PT Frequency 2x / week    PT Duration 8 weeks    PT Treatment/Interventions ADLs/Self Care Home Management;Gait training;Stair training;Functional mobility training;Therapeutic activities;Therapeutic exercise;Balance training;Neuromuscular re-education;Manual techniques;Patient/family education;Passive range of motion    PT Next Visit Plan 10th visit PN next time, use gait belt throuhgout session, continue working on step downs for curb training, static and dynamic balance, gait training    PT Home Exercise Plan [id number]    Consulted and Agree with Plan of Care Patient             Patient will benefit from skilled therapeutic intervention in order to improve the following deficits and impairments:     Visit Diagnosis: Muscle weakness (generalized)  Lack of coordination  Repeated falls  Other abnormalities of gait and mobility  Unspecified lack of coordination     Problem List Patient Active Problem List  Diagnosis Date Noted   Liver cirrhosis secondary to NASH [facility])    GERD (gastroesophageal reflux disease)    Pancytopenia (HCC)    Osteomyelitis of jaw 12/29/2020   Acute osteomyelitis of mandible 12/26/2020   S/P inguinal hernia repair 01/14/2020   At high risk for falls    S/P shoulder surgery 03/05/2018   Excessive daytime sleepiness 02/18/2018   Crohn's disease of colon with rectal bleeding ([place]) 02/18/2018   Blood in stool 02/18/2018   Iron deficiency anemia due to chronic blood loss 02/18/2018   Iron deficiency anemia due to sideropenic dysphagia 02/18/2018   Renal calculus, left 02/02/2018   History of sepsis  12/16/2017   Renal cyst, acquired, left 07/08/2017   Frequent falls 07/08/2017   Renal lesion 05/28/2017   MSSA bacteremia 05/06/2017   Confusion 04/02/2017   Syncope 08/30/2016   Depression 04/30/2016   Memory loss 04/30/2016   OSA on CPAP 10/31/2015   Nonepileptic episode ([place]) 04/25/2015   Seizure ([place]) 04/04/2013   Abnormal EKG 03/12/2013   DOE (dyspnea on exertion) 03/12/2013   Complex partial seizure ([place]) 04/19/2012   DM2 (diabetes mellitus, type 2) ([place]) 09/25/2011   OTHER DYSPHAGIA 03/27/2009   Cough 11/10/2008   GERD 12/07/2007   COLONIC POLYPS, ADENOMATOUS, HX OF 12/07/2007   EXTERNAL HEMORRHOIDS 09/11/2007   CROHN'S DISEASE, LARGE AND SMALL INTESTINES 09/11/2007   OSTEOPOROSIS 09/11/2007   HYPERLIPIDEMIA NEC/NOS 02/27/2007   Essential hypertension 02/27/2007    [name] [name], PT 03/05/21 11:08 AM   [facility] [address] [place], [place], [address] Phone: [phone]   Fax:  [phone]  Name: [name] [name] MRN: [id number] Date of Birth: 17-Jul-1960

## 2021-03-06 DIAGNOSIS — F331 Major depressive disorder, recurrent, moderate: Secondary | ICD-10-CM | POA: Diagnosis not present

## 2021-03-07 ENCOUNTER — Encounter: Payer: 59 | Admitting: Physical Therapy

## 2021-03-09 ENCOUNTER — Other Ambulatory Visit (HOSPITAL_COMMUNITY): Payer: Self-pay | Admitting: Otolaryngology

## 2021-03-09 DIAGNOSIS — R1313 Dysphagia, pharyngeal phase: Secondary | ICD-10-CM

## 2021-03-13 ENCOUNTER — Encounter: Payer: Self-pay | Admitting: Physical Therapy

## 2021-03-13 ENCOUNTER — Other Ambulatory Visit: Payer: Self-pay

## 2021-03-13 ENCOUNTER — Ambulatory Visit: Payer: Medicare Other | Admitting: Physical Therapy

## 2021-03-13 DIAGNOSIS — M6281 Muscle weakness (generalized): Secondary | ICD-10-CM | POA: Diagnosis not present

## 2021-03-13 DIAGNOSIS — R279 Unspecified lack of coordination: Secondary | ICD-10-CM

## 2021-03-13 DIAGNOSIS — R2689 Other abnormalities of gait and mobility: Secondary | ICD-10-CM

## 2021-03-13 DIAGNOSIS — R296 Repeated falls: Secondary | ICD-10-CM

## 2021-03-13 NOTE — Therapy (Signed)
[facility] [address], [place], [place], [address] Phone: [phone]   Fax:  [phone]  Physical Therapy Treatment  Patient Details  Name: [name] [name] MRN: [id number] Date of Birth: [birth year] Referring Provider (PT): [name], MD  Progress Note Reporting Period 01/30/21 to 03/13/21  See note below for Objective Data and Assessment of Progress/Goals.      Encounter Date: 03/13/2021   PT End of Session - 03/13/21 1102     Visit Number 10    Date for PT Re-Evaluation 03/27/21    Authorization Type UHC Medicare    Authorization - Visit Number 10    Authorization - Number of Visits 60    Progress Note Due on Visit 20    PT Start Time 1100    PT Stop Time 1142    PT Time Calculation (min) 42 min    Equipment Utilized During Treatment Gait belt    Activity Tolerance Patient tolerated treatment well    Behavior During Therapy WFL for tasks assessed/performed             Past Medical History:  Diagnosis Date   Adenomatous colon polyp 10/1999   Anxiety    Arthritis    neck, yoga helps.   Cataract    Complex partial seizure ([place])    last seizure was 08-30-2018   Crohn's disease of small and large intestines ([place]) 1999   Depression    Diabetes mellitus    no meds at this time 10-24-17   Esophageal stricture    External hemorrhoids    GERD (gastroesophageal reflux disease)    Glaucoma    History of kidney stones    3 large stones still present   Hyperlipemia    Hypertension    resolved with weight loss    Inguinal hernia, bilateral 12/2019   Liver cirrhosis secondary to NASH ([place])    Migraines    Osteoporosis    PONV (postoperative nausea and vomiting)    PTSD (post-traumatic stress disorder)    Renal cyst, acquired, left 07/08/2017   Skin cancer    squamous cell multiple; [place]; followed every 3 months.   Sleep apnea    CPAP machine, uses nightly   Staphylococcus aureus bacteremia  with sepsis ([place]) 05/28/2017   MRSA 2012    Past Surgical History:  Procedure Laterality Date   CATARACT EXTRACTION Bilateral    COLONOSCOPY     DEBRIDEMENT MANDIBLE N/A 12/07/2020   Procedure: DEBRIDEMENT OF MANDIBLE;  Surgeon: [name], DMD;  Location: [place];  Service: Oral Surgery;  Laterality: N/A;   ELBOW SURGERY  2012   elbow MRSA infection    EYE SURGERY     cataracts removed, /w IOL   GROSS TEETH DEBRIDEMENT N/A 12/29/2020   Procedure: MANDIBULAR  DEBRIDEMENT;  Surgeon: [name], DMD;  Location: [place];  Service: Dentistry;  Laterality: N/A;   INCISION AND DRAINAGE ABSCESS N/A 12/07/2020   Procedure: INCISION AND DRAINAGE MANDIBULAR ABSCESS;  Surgeon: [name], DMD;  Location: [place];  Service: Oral Surgery;  Laterality: N/A;   INGUINAL HERNIA REPAIR Right 01/14/2020   Procedure: OPEN RIGHT HERNIA REPAIR INGUINAL WITH MESH;  Surgeon: [name], MD;  [facility];  Service: General;  Laterality: Right;   INSERTION OF MESH N/A 07/01/2016   Procedure: INSERTION OF MESH;  Surgeon: [name], MD;  Location: [place];  Service: General;  Laterality: N/A;   IR URETERAL STENT  LEFT NEW ACCESS W/O SEP NEPHROSTOMY CATH  02/02/2018   NEPHROLITHOTOMY Left 02/02/2018   Procedure: NEPHROLITHOTOMY PERCUTANEOUS;  Surgeon: [name], MD;  [facility];  Service: Urology;  Laterality: Left;   POLYPECTOMY     SCALP LACERATION REPAIR Right 10/16/2017   From fall/staples   SHOULDER ARTHROSCOPY Right 03/05/2018   Procedure: ARTHROSCOPY SHOULDER AND OPEN DISTAL CLAVICLE EXCISION;  Surgeon: [name], MD;  Location: [place];  Service: Orthopedics;  Laterality: Right;   SHOULDER SURGERY     SINUS SURGERY WITH INSTATRAK     TEE WITHOUT CARDIOVERSION N/A 05/09/2017   Procedure: TRANSESOPHAGEAL ECHOCARDIOGRAM (TEE);  Surgeon: [name], MD;  [facility];  Service: Cardiovascular;  Laterality: N/A;   TOOTH EXTRACTION N/A 12/07/2020   Procedure:  DENTAL EXTRACTIONS OF TEETH TWENTY TO THIRTY;  Surgeon: [name], DMD;  Location: [place];  Service: Oral Surgery;  Laterality: N/A;   TOOTH EXTRACTION N/A 12/29/2020   Procedure: DENTAL RESTORATION/EXTRACTIONS;  Surgeon: [name], DMD;  Location: [place];  Service: Dentistry;  Laterality: N/A;   TRANSTHORACIC ECHOCARDIOGRAM  02/22/2011   EF 55-65%; increased pattern of LVH with mild conc hypertrophy, abnormal relaxation & increased filling pressure (grade 2 diastolic dysfunction); atrial septum thickened (lipomatous hypertrophy)   UMBILICAL HERNIA REPAIR N/A 07/01/2016   Procedure: UMBILICAL HERNIA REPAIR WITH MESH;  Surgeon: [name], MD;  Location: [place];  Service: General;  Laterality: N/A;   UPPER GASTROINTESTINAL ENDOSCOPY     VASECTOMY      There were no vitals filed for this visit.   Subjective Assessment - 03/13/21 1059     Subjective I walked some this morning but had to stop early b/c I wasn't doing well - very dizzy and I still haven't recovered much.    Pertinent History on disability, has memory problems, liver cirrhosis, [place], osteoporosis with multiple fractures including C6, thrombocytopenia, gluacoma (loss of peripheral vision).  Has lost 75lb through walking but concerned about safety    Limitations Walking    How long can you walk comfortably? 2.5 miles with walking sticks    Diagnostic tests C6 fracture and ligamentum flavum tear - non-surgical    Patient Stated Goals work on coming down and up stairs, balance    Currently in Pain? No/denies                [facility] PT Assessment - 03/13/21 0001       Assessment   Medical Diagnosis R29.6 (ICD-10-CM) - Repeated falls    Referring Provider (PT) [name], MD    Hand Dominance Right    Next MD Visit Oct      Strength   Overall Strength Comments Lt hip abduction 4/5, Rt hip 4+/5      Ambulation/Gait   Ambulation/Gait Yes    Ambulation/Gait Assistance 5: Supervision;4: Min guard     Assistive device None    Stairs Yes    Stairs Assistance 5: Supervision    Stair Management Technique One rail Left;Alternating pattern;Step to pattern    Number of Stairs 16    Height of Stairs 6      Standardized Balance Assessment   Standardized Balance Assessment Dynamic Gait Index    Five times sit to stand comments  23 sec no UE use, has done as fast as 15 sec      Dynamic Gait Index   Level Surface Mild Impairment    Change in Gait Speed Mild Impairment  Gait with Horizontal Head Turns Moderate Impairment    Gait with Vertical Head Turns Severe Impairment    Gait and Pivot Turn Mild Impairment    Step Over Obstacle Mild Impairment    Step Around Obstacles Mild Impairment    Steps Mild Impairment    Total Score 13    DGI comment: 13/26                           [facility] Adult PT Treatment/Exercise - 03/13/21 0001       Exercises   Exercises Lumbar;Shoulder;Knee/Hip      Lumbar Exercises: Seated   Other Seated Lumbar Exercises turn on vestibular system: march with contralateral UE knee tap, march with opp arm elevation x 20 each      Knee/Hip Exercises: Machines for Strengthening   Total Gym Leg Press 150lb x 10, 160lb x 10      Knee/Hip Exercises: Standing   Hip Flexion AROM;Both;20 reps;Knee bent    Hip Flexion Limitations taps to edge of TM    Hip Abduction Stengthening;Both;1 set;20 reps;Knee straight    Abduction Limitations toe taps to sides, alt                 Balance Exercises - 03/13/21 0001       Balance Exercises: Standing   Standing, One Foot on a Step 6 inch;30 secs;Time;Cognitive challenge;Other (comment)   VC to tap contralateral foot to 1st step or 2nd step   Stepping Strategy Anterior   exaggerated step forward with contralateral reach 10 sec holds x 5 reps each   Retro Gait 1 rep   gait belt   Sidestepping 2 reps   gait belt                 PT Short Term Goals - 02/27/21 1337       PT SHORT TERM GOAL #1    Title Patient will complete 3 minute walk test using LRAD with only supervision to indicate decreased fall risk.    Status Achieved      PT SHORT TERM GOAL #2   Title Pt will verbalize and demo safe strategies for stair pattern (step to for descending) and gait patterns/pace (slow down) without AD    Status Achieved      PT SHORT TERM GOAL #3   Title PT and Pt will explore gait with walker and make decisions about appropriateness of use for improved safety.    Baseline using walking sticks and has had no falls since starting PT    Status Achieved      PT SHORT TERM GOAL #4   Title Pt ind with initial HEP    Status Achieved               PT Long Term Goals - 03/13/21 1101       PT LONG TERM GOAL #1   Title Improve 5x sit to stand to </= 14 sec.    Baseline 15 sec on 02/27/21    Status On-going      PT LONG TERM GOAL #2   Title Pt will demo up/down stairs x 3 rounds (4 6 inch steps) using bil UEs and safe strategy to demo improved safety on stairs      PT LONG TERM GOAL #3   Title Pt will demo safety/no LOB with community ambulation for 6' walk test, curb up/down and grassy surface with supervision using least restrictive  AD or no AD.    Baseline 6' walk test met but LOB stepping off grassy curb    Status On-going      PT LONG TERM GOAL #4   Title Pt will report at least 50% more confidence in balance with daily tasks    Baseline 30%    Status On-going      PT LONG TERM GOAL #5   Title Ind with advanced HEP and understand how to safely progress    Status On-going                   Plan - 03/13/21 1243     Clinical Impression Statement Pt continues to be a signif fall risk secondary to neurological, strength, coordination and dizziness factors.  PT using gait belt and ranges of assist as needed for safety throughout session.  Pt reports feeling 30% more confident in his stability, gait control and balance since starting PT.  He walks several miles daily with  walking sticks and has had no falls since starting PT.  He reports he no longer lists to the Lt since he started using walking sticks.  His best time to date on 5x sit to stand is 15 sec; today it was 23 sec but with good erect standing and eccentric control with each rep.  He has good strength (5/5) in bil LEs but coordination of movement is a signif challenge to him, especially when cued to use contralateral UE/LE movements.  He gets dizzy with exertion so PT closely monitoring tolerance of ther ex throughout session.  His score on DGI today was 13/26 showing signif fall risk with dynamic gait challenges.  Pt will continue to benefit from skilled PT to improve functional coordination, balance and gait for safety.    Comorbidities seizures, memory deficits, osteoporosis with Hx of multiple fractures    PT Frequency 2x / week    PT Duration 8 weeks    PT Treatment/Interventions ADLs/Self Care Home Management;Gait training;Stair training;Functional mobility training;Therapeutic activities;Therapeutic exercise;Balance training;Neuromuscular re-education;Manual techniques;Patient/family education;Passive range of motion    PT Next Visit Plan gait belt throughout session, work on curb training, coordination of opp UE/LE movement in standing and sitting, dynamic balance and gait    PT Home Exercise Plan [id number]    Consulted and Agree with Plan of Care Patient             Patient will benefit from skilled therapeutic intervention in order to improve the following deficits and impairments:     Visit Diagnosis: Muscle weakness (generalized)  Lack of coordination  Repeated falls  Other abnormalities of gait and mobility     Problem List Patient Active Problem List   Diagnosis Date Noted   Liver cirrhosis secondary to NASH ([place])    GERD (gastroesophageal reflux disease)    Pancytopenia (HCC)    Osteomyelitis of jaw 12/29/2020   Acute osteomyelitis of mandible 12/26/2020   S/P inguinal  hernia repair 01/14/2020   At high risk for falls    S/P shoulder surgery 03/05/2018   Excessive daytime sleepiness 02/18/2018   Crohn's disease of colon with rectal bleeding ([place]) 02/18/2018   Blood in stool 02/18/2018   Iron deficiency anemia due to chronic blood loss 02/18/2018   Iron deficiency anemia due to sideropenic dysphagia 02/18/2018   Renal calculus, left 02/02/2018   History of sepsis 12/16/2017   Renal cyst, acquired, left 07/08/2017   Frequent falls 07/08/2017   Renal lesion 05/28/2017   MSSA  bacteremia 05/06/2017   Confusion 04/02/2017   Syncope 08/30/2016   Depression 04/30/2016   Memory loss 04/30/2016   OSA on CPAP 10/31/2015   Nonepileptic episode ([place]) 04/25/2015   Seizure ([place]) 04/04/2013   Abnormal EKG 03/12/2013   DOE (dyspnea on exertion) 03/12/2013   Complex partial seizure ([place]) 04/19/2012   DM2 (diabetes mellitus, type 2) ([place]) 09/25/2011   OTHER DYSPHAGIA 03/27/2009   Cough 11/10/2008   GERD 12/07/2007   COLONIC POLYPS, ADENOMATOUS, HX OF 12/07/2007   EXTERNAL HEMORRHOIDS 09/11/2007   CROHN'S DISEASE, LARGE AND SMALL INTESTINES 09/11/2007   OSTEOPOROSIS 09/11/2007   HYPERLIPIDEMIA NEC/NOS 02/27/2007   Essential hypertension 02/27/2007    Baruch Merl, PT 03/13/21 12:50 PM   [facility] [address], [place], [place], [address] Phone: [phone]   Fax:  [phone]  Name: [name] [name] MRN: [id number] Date of Birth: August 13, 1960

## 2021-03-15 DIAGNOSIS — Z85828 Personal history of other malignant neoplasm of skin: Secondary | ICD-10-CM | POA: Diagnosis not present

## 2021-03-15 DIAGNOSIS — C44329 Squamous cell carcinoma of skin of other parts of face: Secondary | ICD-10-CM | POA: Diagnosis not present

## 2021-03-16 ENCOUNTER — Encounter: Payer: Self-pay | Admitting: Physical Therapy

## 2021-03-16 ENCOUNTER — Other Ambulatory Visit: Payer: Self-pay

## 2021-03-16 ENCOUNTER — Ambulatory Visit: Payer: Medicare Other | Admitting: Physical Therapy

## 2021-03-16 DIAGNOSIS — R296 Repeated falls: Secondary | ICD-10-CM

## 2021-03-16 DIAGNOSIS — M6281 Muscle weakness (generalized): Secondary | ICD-10-CM

## 2021-03-16 DIAGNOSIS — H47293 Other optic atrophy, bilateral: Secondary | ICD-10-CM | POA: Insufficient documentation

## 2021-03-16 DIAGNOSIS — R2689 Other abnormalities of gait and mobility: Secondary | ICD-10-CM

## 2021-03-16 DIAGNOSIS — R279 Unspecified lack of coordination: Secondary | ICD-10-CM

## 2021-03-16 NOTE — Therapy (Signed)
[facility] [address], [place], [place], [address] Phone: [phone]   Fax:  [phone]  Physical Therapy Treatment  Patient Details  Name: [name] [name] MRN: [id number] Date of Birth: 05/21/1961 Referring Provider (PT): [name], MD   Encounter Date: 03/16/2021   PT End of Session - 03/16/21 1057     Visit Number 11    Date for PT Re-Evaluation 03/27/21    Authorization Type [employer] Medicare    Authorization - Visit Number 11    Authorization - Number of Visits 60    Progress Note Due on Visit 20    PT Start Time 1020    PT Stop Time 1058    PT Time Calculation (min) 38 min    Equipment Utilized During Treatment Gait belt    Activity Tolerance Patient tolerated treatment well    Behavior During Therapy [facility] for tasks assessed/performed             Past Medical History:  Diagnosis Date   Adenomatous colon polyp 10/1999   Anxiety    Arthritis    neck, yoga helps.   Cataract    Complex partial seizure ([place])    last seizure was 08-30-2018   Crohn's disease of small and large intestines ([place]) 1999   Depression    Diabetes mellitus    no meds at this time 10-24-17   Esophageal stricture    External hemorrhoids    GERD (gastroesophageal reflux disease)    Glaucoma    History of kidney stones    3 large stones still present   Hyperlipemia    Hypertension    resolved with weight loss    Inguinal hernia, bilateral 12/2019   Liver cirrhosis secondary to NASH ([place])    Migraines    Osteoporosis    PONV (postoperative nausea and vomiting)    PTSD (post-traumatic stress disorder)    Renal cyst, acquired, left 07/08/2017   Skin cancer    squamous cell multiple; [place]; followed every 3 months.   Sleep apnea    CPAP machine, uses nightly   Staphylococcus aureus bacteremia with sepsis ([place]) 05/28/2017   MRSA 2012    Past Surgical History:  Procedure Laterality Date   CATARACT EXTRACTION  Bilateral    COLONOSCOPY     DEBRIDEMENT MANDIBLE N/A 12/07/2020   Procedure: DEBRIDEMENT OF MANDIBLE;  Surgeon: [name], DMD;  Location: [place];  Service: Oral Surgery;  Laterality: N/A;   ELBOW SURGERY  2012   elbow MRSA infection    EYE SURGERY     cataracts removed, /w IOL   GROSS TEETH DEBRIDEMENT N/A 12/29/2020   Procedure: MANDIBULAR  DEBRIDEMENT;  Surgeon: [name], DMD;  Location: [place];  Service: Dentistry;  Laterality: N/A;   INCISION AND DRAINAGE ABSCESS N/A 12/07/2020   Procedure: INCISION AND DRAINAGE MANDIBULAR ABSCESS;  Surgeon: [name], DMD;  Location: [place];  Service: Oral Surgery;  Laterality: N/A;   INGUINAL HERNIA REPAIR Right 01/14/2020   Procedure: OPEN RIGHT HERNIA REPAIR INGUINAL WITH MESH;  Surgeon: [name], MD;  Location: [facility];  Service: General;  Laterality: Right;   INSERTION OF MESH N/A 07/01/2016   Procedure: INSERTION OF MESH;  Surgeon: [name], MD;  Location: [place];  Service: General;  Laterality: N/A;   IR URETERAL STENT LEFT NEW ACCESS W/O SEP NEPHROSTOMY CATH  02/02/2018   NEPHROLITHOTOMY Left 02/02/2018   Procedure: NEPHROLITHOTOMY PERCUTANEOUS;  Surgeon: Karsten Ro,  Elta Guadeloupe, MD;  Location: [facility];  Service: Urology;  Laterality: Left;   POLYPECTOMY     SCALP LACERATION REPAIR Right 10/16/2017   From fall/staples   SHOULDER ARTHROSCOPY Right 03/05/2018   Procedure: ARTHROSCOPY SHOULDER AND OPEN DISTAL CLAVICLE EXCISION;  Surgeon: [name], MD;  Location: [place];  Service: Orthopedics;  Laterality: Right;   SHOULDER SURGERY     SINUS SURGERY WITH INSTATRAK     TEE WITHOUT CARDIOVERSION N/A 05/09/2017   Procedure: TRANSESOPHAGEAL ECHOCARDIOGRAM (TEE);  Surgeon: [name], MD;  Location: [facility];  Service: Cardiovascular;  Laterality: N/A;   TOOTH EXTRACTION N/A 12/07/2020   Procedure: DENTAL EXTRACTIONS OF TEETH TWENTY TO THIRTY;  Surgeon: [name], DMD;  Location: [place];  Service: Oral  Surgery;  Laterality: N/A;   TOOTH EXTRACTION N/A 12/29/2020   Procedure: DENTAL RESTORATION/EXTRACTIONS;  Surgeon: [name], DMD;  Location: [place];  Service: Dentistry;  Laterality: N/A;   TRANSTHORACIC ECHOCARDIOGRAM  02/22/2011   EF 55-65%; increased pattern of LVH with mild conc hypertrophy, abnormal relaxation & increased filling pressure (grade 2 diastolic dysfunction); atrial septum thickened (lipomatous hypertrophy)   UMBILICAL HERNIA REPAIR N/A 07/01/2016   Procedure: UMBILICAL HERNIA REPAIR WITH MESH;  Surgeon: [name], MD;  Location: [place];  Service: General;  Laterality: N/A;   UPPER GASTROINTESTINAL ENDOSCOPY     VASECTOMY      There were no vitals filed for this visit.   Subjective Assessment - 03/16/21 1025     Subjective I am not currently dizzy.    Pertinent History on disability, has memory problems, liver cirrhosis, [place], osteoporosis with multiple fractures including C6, thrombocytopenia, gluacoma (loss of peripheral vision).  Has lost 75lb through walking but concerned about safety    Limitations Walking    How long can you walk comfortably? 2.5 miles with walking sticks    Diagnostic tests C6 fracture and ligamentum flavum tear - non-surgical    Patient Stated Goals work on coming down and up stairs, balance    Currently in Pain? No/denies                               [facility] Adult PT Treatment/Exercise - 03/16/21 0001       Exercises   Exercises Knee/Hip      Lumbar Exercises: Aerobic   Recumbent Bike L4 x 2'      Knee/Hip Exercises: Machines for Strengthening   Total Gym Leg Press 160lb bil LEs x 20 reps      Knee/Hip Exercises: Standing   Step Down 2 sets;10 reps;Hand Hold: 1;Step Height: 2";Step Height: 4"    Step Down Limitations heel tap and return to step      Knee/Hip Exercises: Seated   Sit to Sand 10 reps;without UE support   holding 15lb     Shoulder Exercises: Power Development worker, community 10 reps    Extension  Limitations 30lb bil    Other Power Tower Exercises pallof press x 10 each way 30lb                 Balance Exercises - 03/16/21 0001       Balance Exercises: Standing   Stepping Strategy Anterior;Posterior;Lateral   with and without UE fwd reaching, contralateral and ipsilateral, gait belt and VC for coordination   Marching Upper extremity assist 2;Solid surface;Cognitive challenge;Other (comment)   VC by PT for Rt or Lt march  to 1st or 2nd step                 PT Short Term Goals - 02/27/21 1337       PT SHORT TERM GOAL #1   Title Patient will complete 3 minute walk test using LRAD with only supervision to indicate decreased fall risk.    Status Achieved      PT SHORT TERM GOAL #2   Title Pt will verbalize and demo safe strategies for stair pattern (step to for descending) and gait patterns/pace (slow down) without AD    Status Achieved      PT SHORT TERM GOAL #3   Title PT and Pt will explore gait with walker and make decisions about appropriateness of use for improved safety.    Baseline using walking sticks and has had no falls since starting PT    Status Achieved      PT SHORT TERM GOAL #4   Title Pt ind with initial HEP    Status Achieved               PT Long Term Goals - 03/13/21 1101       PT LONG TERM GOAL #1   Title Improve 5x sit to stand to </= 14 sec.    Baseline 15 sec on 02/27/21    Status On-going      PT LONG TERM GOAL #2   Title Pt will demo up/down stairs x 3 rounds (4 6 inch steps) using bil UEs and safe strategy to demo improved safety on stairs      PT LONG TERM GOAL #3   Title Pt will demo safety/no LOB with community ambulation for 6' walk test, curb up/down and grassy surface with supervision using least restrictive AD or no AD.    Baseline 6' walk test met but LOB stepping off grassy curb    Status On-going      PT LONG TERM GOAL #4   Title Pt will report at least 50% more confidence in balance with daily tasks     Baseline 30%    Status On-going      PT LONG TERM GOAL #5   Title Ind with advanced HEP and understand how to safely progress    Status On-going                   Plan - 03/16/21 1105     Clinical Impression Statement Pt continues to be a signif fall risk secondary to neurological, strength, coordination and dizziness factors. PT using gait belt and ranges of assist as needed for safety throughout session. PT worked on controlled step downs from 2" and 4" riser due to Pt's difficulty with curbs and descending stairs.  VCs needed to keep step down close to step.  Pt with several LOB within session with need for max A of 1 to prevent fall.  PT requested Pt bring walking sticks next visit for re-eval to train curb step downs using AD.    Comorbidities seizures, memory deficits, osteoporosis with Hx of multiple fractures    PT Frequency 2x / week    PT Duration 8 weeks    PT Treatment/Interventions ADLs/Self Care Home Management;Gait training;Stair training;Functional mobility training;Therapeutic activities;Therapeutic exercise;Balance training;Neuromuscular re-education;Manual techniques;Patient/family education;Passive range of motion    PT Next Visit Plan ERO, gait belt throughout session, work on curb training with walking sticks, coordination of opp UE/LE movement in standing and sitting, dynamic balance and gait  PT Home Exercise Plan [id number]    Consulted and Agree with Plan of Care Patient    Family Member Consulted Pt's wife             Patient will benefit from skilled therapeutic intervention in order to improve the following deficits and impairments:     Visit Diagnosis: Muscle weakness (generalized)  Lack of coordination  Repeated falls  Other abnormalities of gait and mobility     Problem List Patient Active Problem List   Diagnosis Date Noted   Liver cirrhosis secondary to NASH ([place])    GERD (gastroesophageal reflux disease)    Pancytopenia ([place])     Osteomyelitis of jaw 12/29/2020   Acute osteomyelitis of mandible 12/26/2020   S/P inguinal hernia repair 01/14/2020   At high risk for falls    S/P shoulder surgery 03/05/2018   Excessive daytime sleepiness 02/18/2018   Crohn's disease of colon with rectal bleeding ([place]) 02/18/2018   Blood in stool 02/18/2018   Iron deficiency anemia due to chronic blood loss 02/18/2018   Iron deficiency anemia due to sideropenic dysphagia 02/18/2018   Renal calculus, left 02/02/2018   History of sepsis 12/16/2017   Renal cyst, acquired, left 07/08/2017   Frequent falls 07/08/2017   Renal lesion 05/28/2017   MSSA bacteremia 05/06/2017   Confusion 04/02/2017   Syncope 08/30/2016   Depression 04/30/2016   Memory loss 04/30/2016   OSA on CPAP 10/31/2015   Nonepileptic episode ([place]) 04/25/2015   Seizure ([place]) 04/04/2013   Abnormal EKG 03/12/2013   DOE (dyspnea on exertion) 03/12/2013   Complex partial seizure ([place]) 04/19/2012   DM2 (diabetes mellitus, type 2) ([place]) 09/25/2011   OTHER DYSPHAGIA 03/27/2009   Cough 11/10/2008   GERD 12/07/2007   COLONIC POLYPS, ADENOMATOUS, HX OF 12/07/2007   EXTERNAL HEMORRHOIDS 09/11/2007   CROHN'S DISEASE, LARGE AND SMALL INTESTINES 09/11/2007   OSTEOPOROSIS 09/11/2007   HYPERLIPIDEMIA NEC/NOS 02/27/2007   Essential hypertension 02/27/2007     , PT 03/16/21 11:08 AM   [facility] [address], [place], [place], [address] Phone: [phone]   Fax:  [phone]  Name: [name] [name] MRN: [id number] Date of Birth: 12-May-1961

## 2021-03-19 ENCOUNTER — Inpatient Hospital Stay (HOSPITAL_COMMUNITY): Admission: RE | Admit: 2021-03-19 | Payer: 59 | Source: Ambulatory Visit

## 2021-03-19 ENCOUNTER — Encounter (HOSPITAL_COMMUNITY): Payer: Self-pay

## 2021-03-20 ENCOUNTER — Other Ambulatory Visit: Payer: Self-pay | Admitting: Otolaryngology

## 2021-03-20 DIAGNOSIS — C4492 Squamous cell carcinoma of skin, unspecified: Secondary | ICD-10-CM

## 2021-03-20 DIAGNOSIS — L989 Disorder of the skin and subcutaneous tissue, unspecified: Secondary | ICD-10-CM

## 2021-03-20 DIAGNOSIS — C44629 Squamous cell carcinoma of skin of left upper limb, including shoulder: Secondary | ICD-10-CM

## 2021-03-20 DIAGNOSIS — C44621 Squamous cell carcinoma of skin of unspecified upper limb, including shoulder: Secondary | ICD-10-CM

## 2021-03-20 DIAGNOSIS — C4442 Squamous cell carcinoma of skin of scalp and neck: Secondary | ICD-10-CM

## 2021-03-21 ENCOUNTER — Ambulatory Visit: Payer: 59 | Admitting: Physical Therapy

## 2021-03-21 DIAGNOSIS — J310 Chronic rhinitis: Secondary | ICD-10-CM | POA: Diagnosis not present

## 2021-03-21 DIAGNOSIS — C4492 Squamous cell carcinoma of skin, unspecified: Secondary | ICD-10-CM | POA: Diagnosis not present

## 2021-03-21 DIAGNOSIS — N200 Calculus of kidney: Secondary | ICD-10-CM | POA: Diagnosis not present

## 2021-03-21 DIAGNOSIS — C44329 Squamous cell carcinoma of skin of other parts of face: Secondary | ICD-10-CM | POA: Diagnosis not present

## 2021-03-21 DIAGNOSIS — M799 Soft tissue disorder, unspecified: Secondary | ICD-10-CM | POA: Diagnosis not present

## 2021-03-21 DIAGNOSIS — L989 Disorder of the skin and subcutaneous tissue, unspecified: Secondary | ICD-10-CM | POA: Diagnosis not present

## 2021-03-21 DIAGNOSIS — C4442 Squamous cell carcinoma of skin of scalp and neck: Secondary | ICD-10-CM | POA: Diagnosis not present

## 2021-03-21 DIAGNOSIS — R161 Splenomegaly, not elsewhere classified: Secondary | ICD-10-CM | POA: Diagnosis not present

## 2021-03-21 DIAGNOSIS — C44629 Squamous cell carcinoma of skin of left upper limb, including shoulder: Secondary | ICD-10-CM | POA: Diagnosis not present

## 2021-03-21 DIAGNOSIS — R220 Localized swelling, mass and lump, head: Secondary | ICD-10-CM | POA: Diagnosis not present

## 2021-03-22 ENCOUNTER — Ambulatory Visit: Payer: Medicare Other

## 2021-03-22 DIAGNOSIS — D0439 Carcinoma in situ of skin of other parts of face: Secondary | ICD-10-CM | POA: Diagnosis not present

## 2021-03-22 DIAGNOSIS — L989 Disorder of the skin and subcutaneous tissue, unspecified: Secondary | ICD-10-CM | POA: Diagnosis not present

## 2021-03-24 ENCOUNTER — Ambulatory Visit
Admission: RE | Admit: 2021-03-24 | Discharge: 2021-03-24 | Disposition: A | Discharge status: Home / Self Care | Payer: 59 | Source: Ambulatory Visit | Attending: Otolaryngology | Admitting: Otolaryngology

## 2021-03-24 ENCOUNTER — Other Ambulatory Visit: Payer: Self-pay

## 2021-03-24 DIAGNOSIS — L989 Disorder of the skin and subcutaneous tissue, unspecified: Secondary | ICD-10-CM

## 2021-03-24 DIAGNOSIS — C4442 Squamous cell carcinoma of skin of scalp and neck: Secondary | ICD-10-CM

## 2021-03-24 DIAGNOSIS — C4492 Squamous cell carcinoma of skin, unspecified: Secondary | ICD-10-CM

## 2021-03-24 DIAGNOSIS — C44329 Squamous cell carcinoma of skin of other parts of face: Secondary | ICD-10-CM | POA: Diagnosis not present

## 2021-03-24 DIAGNOSIS — C44629 Squamous cell carcinoma of skin of left upper limb, including shoulder: Secondary | ICD-10-CM

## 2021-03-24 DIAGNOSIS — C44621 Squamous cell carcinoma of skin of unspecified upper limb, including shoulder: Secondary | ICD-10-CM

## 2021-03-24 MED ORDER — GADOBENATE DIMEGLUMINE 529 MG/ML IV SOLN
17.0000 mL | Freq: Once | INTRAVENOUS | Status: AC | PRN
  Administered 2021-03-24: 17 mL via INTRAVENOUS

## 2021-03-26 DIAGNOSIS — C4492 Squamous cell carcinoma of skin, unspecified: Secondary | ICD-10-CM | POA: Diagnosis not present

## 2021-03-26 DIAGNOSIS — D485 Neoplasm of uncertain behavior of skin: Secondary | ICD-10-CM | POA: Diagnosis not present

## 2021-03-27 DIAGNOSIS — K7460 Unspecified cirrhosis of liver: Secondary | ICD-10-CM | POA: Diagnosis not present

## 2021-03-27 DIAGNOSIS — D61818 Other pancytopenia: Secondary | ICD-10-CM | POA: Diagnosis not present

## 2021-03-27 DIAGNOSIS — C4492 Squamous cell carcinoma of skin, unspecified: Secondary | ICD-10-CM | POA: Diagnosis not present

## 2021-03-27 DIAGNOSIS — G40209 Localization-related (focal) (partial) symptomatic epilepsy and epileptic syndromes with complex partial seizures, not intractable, without status epilepticus: Secondary | ICD-10-CM | POA: Diagnosis not present

## 2021-03-27 DIAGNOSIS — Z01818 Encounter for other preprocedural examination: Secondary | ICD-10-CM | POA: Diagnosis not present

## 2021-03-27 DIAGNOSIS — K5090 Crohn's disease, unspecified, without complications: Secondary | ICD-10-CM | POA: Diagnosis not present

## 2021-03-30 DIAGNOSIS — K219 Gastro-esophageal reflux disease without esophagitis: Secondary | ICD-10-CM | POA: Diagnosis not present

## 2021-03-30 DIAGNOSIS — G4733 Obstructive sleep apnea (adult) (pediatric): Secondary | ICD-10-CM | POA: Diagnosis not present

## 2021-03-30 DIAGNOSIS — K7581 Nonalcoholic steatohepatitis (NASH): Secondary | ICD-10-CM | POA: Diagnosis not present

## 2021-03-30 DIAGNOSIS — E119 Type 2 diabetes mellitus without complications: Secondary | ICD-10-CM | POA: Diagnosis not present

## 2021-03-30 DIAGNOSIS — Z01818 Encounter for other preprocedural examination: Secondary | ICD-10-CM | POA: Diagnosis not present

## 2021-03-30 DIAGNOSIS — R569 Unspecified convulsions: Secondary | ICD-10-CM | POA: Diagnosis not present

## 2021-03-30 DIAGNOSIS — I10 Essential (primary) hypertension: Secondary | ICD-10-CM | POA: Diagnosis not present

## 2021-03-30 DIAGNOSIS — Z419 Encounter for procedure for purposes other than remedying health state, unspecified: Secondary | ICD-10-CM | POA: Diagnosis not present

## 2021-03-30 DIAGNOSIS — D61818 Other pancytopenia: Secondary | ICD-10-CM | POA: Diagnosis not present

## 2021-04-01 ENCOUNTER — Other Ambulatory Visit: Payer: 59

## 2021-04-03 ENCOUNTER — Encounter: Payer: Self-pay | Admitting: Gastroenterology

## 2021-04-03 ENCOUNTER — Other Ambulatory Visit (INDEPENDENT_AMBULATORY_CARE_PROVIDER_SITE_OTHER): Payer: Medicare Other

## 2021-04-03 ENCOUNTER — Ambulatory Visit: Payer: 59 | Admitting: Physical Therapy

## 2021-04-03 ENCOUNTER — Ambulatory Visit (INDEPENDENT_AMBULATORY_CARE_PROVIDER_SITE_OTHER): Payer: Medicare Other | Admitting: Gastroenterology

## 2021-04-03 VITALS — BP 110/66 | HR 82 | Ht 71.0 in | Wt 178.0 lb

## 2021-04-03 DIAGNOSIS — K7460 Unspecified cirrhosis of liver: Secondary | ICD-10-CM | POA: Diagnosis not present

## 2021-04-03 DIAGNOSIS — R197 Diarrhea, unspecified: Secondary | ICD-10-CM

## 2021-04-03 DIAGNOSIS — K5080 Crohn's disease of both small and large intestine without complications: Secondary | ICD-10-CM | POA: Diagnosis not present

## 2021-04-03 DIAGNOSIS — K7581 Nonalcoholic steatohepatitis (NASH): Secondary | ICD-10-CM | POA: Diagnosis not present

## 2021-04-03 DIAGNOSIS — F331 Major depressive disorder, recurrent, moderate: Secondary | ICD-10-CM | POA: Diagnosis not present

## 2021-04-03 LAB — C-REACTIVE PROTEIN: CRP: <1 mg/dL (ref 0.5–20.0)

## 2021-04-03 LAB — BASIC METABOLIC PANEL
BUN: 9 mg/dL (ref 6–23)
CO2: 24 mEq/L (ref 19–32)
Calcium: 9.3 mg/dL (ref 8.4–10.5)
Chloride: 109 mEq/L (ref 96–112)
Creatinine, Ser: 0.76 mg/dL (ref 0.40–1.50)
GFR: 98.51 mL/min (ref >60.00)
Glucose, Bld: 99 mg/dL (ref 70–99)
Potassium: 4 mEq/L (ref 3.5–5.1)
Sodium: 142 mEq/L (ref 135–145)

## 2021-04-03 LAB — SEDIMENTATION RATE: Sed Rate: 1 mm/hr (ref 0–20)

## 2021-04-03 LAB — PROTIME-INR
INR: 1.2 ratio — ABNORMAL HIGH (ref 0.8–1.0)
Prothrombin Time: 13.3 s — ABNORMAL HIGH (ref 9.6–13.1)

## 2021-04-03 LAB — AMMONIA: Ammonia: 86 umol/L — ABNORMAL HIGH (ref 11–35)

## 2021-04-03 LAB — TSH: TSH: 2 u[IU]/mL (ref 0.35–5.50)

## 2021-04-03 NOTE — Progress Notes (Signed)
    History of Present Illness: This is a 60 year old male returning for follow-up of diarrhea.  He is accompanied by his wife.  He is currently being treated with Augemtin for a dental infection with osteomyelitis of the right mandible.  His diarrhea has worsened since starting Augmentin.  Recently he has noted 6-8 loose watery nonbloody bowel movements daily.  He has a history of multiple skin cancers.  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  Previously we tried discontinuing 6-MP however his Crohn's symptoms worsened and 6-MP was resumed.  He has unchanged generalized abdominal pain exacerbated by diarrhea. LFTs on October 7 showed a total bilirubin of 2.7 and a minimally elevated AST at 40.  The patient's wife relates he had a CBC performed at his PCPs office and we will request the records.  Current Medications, Allergies, Past Medical History, Past Surgical History, Family History and Social History were reviewed in [employer] record.   Physical Exam: General: Well developed, well nourished, no acute distress Head: Normocephalic and atraumatic Eyes: Sclerae anicteric, EOMI Ears: Normal auditory acuity Mouth: Not examined, mask on during Covid-19 pandemic Lungs: Clear throughout to auscultation Heart: Regular rate and rhythm; no murmurs, rubs or bruits Abdomen: Soft, non tender and non distended. No masses, hepatosplenomegaly or hernias noted. Normal Bowel sounds Rectal: Not done Musculoskeletal: Symmetrical with no gross deformities  Pulses:  Normal pulses noted Extremities: No clubbing, cyanosis, edema or deformities noted Neurological: Alert oriented x 4, grossly nonfocal Psychological:  Alert and cooperative. Normal mood and affect   Assessment and Recommendations:  Crohn's ileocolitis, IBS-D. Suspected Augmentin side effect.  Rule out C. difficile.  He states his Augmentin course is scheduled to be completed in the next 2 days however he  could be treated again with antibiotics.  Continue daily probiotic. BMET, ESR, CRP, AFP, PT/INR, TSH, ammonia today.  Obtain stool GI profile.  We discussed the long-term risks and benefits of 6-mercaptopurine.  We discussed the option of discontinuing 6-mercaptopurine again however with his prior symptom flares after discontinuing 6-MP this does not appear to be a good option.  With shared decision making we decided to continue 6-MP at this time. REV in 2 months.  Cirrhosis, presumed NASH.  Blood work as above. GERD.  Follow antireflux measures and continue Nexium 40 mg p.o. twice daily. Personal history of adenomatous colon polyps.  Surveillance colonoscopy recommended in May 2025. Dental infection and right mandible cellulitis.  Follow-up with ENT as planned. Multiple squamous cell skin cancers.  Follow-up with dermatology as planned.

## 2021-04-03 NOTE — Patient Instructions (Signed)
Your provider has requested that you go to the basement level for lab work before leaving today. Press "B" on the elevator. The lab is located at the first door on the left as you exit the elevator.  Due to recent changes in healthcare laws, you may see the results of your imaging and laboratory studies on MyChart before your provider has had a chance to review them.  We understand that in some cases there may be results that are confusing or concerning to you. Not all laboratory results come back in the same time frame and the provider may be waiting for multiple results in order to interpret others.  Please give [place] 48 hours in order for your provider to thoroughly review all the results before contacting the office for clarification of your results.   The [facility] providers would like to encourage you to use [facility] to communicate with providers for non-urgent requests or questions.  Due to long hold times on the telephone, sending your provider a message by [facility] may be a faster and more efficient way to get a response.  Please allow 48 business hours for a response.  Please remember that this is for non-urgent requests.   Thank you for choosing me and [facility].  Pricilla Riffle. Dagoberto Ligas., MD., [place] Regal

## 2021-04-04 ENCOUNTER — Other Ambulatory Visit: Payer: 59

## 2021-04-04 DIAGNOSIS — K5080 Crohn's disease of both small and large intestine without complications: Secondary | ICD-10-CM

## 2021-04-04 DIAGNOSIS — R197 Diarrhea, unspecified: Secondary | ICD-10-CM

## 2021-04-04 DIAGNOSIS — A048 Other specified bacterial intestinal infections: Secondary | ICD-10-CM | POA: Diagnosis not present

## 2021-04-04 LAB — AFP TUMOR MARKER: AFP-Tumor Marker: 7.2 ng/mL — ABNORMAL HIGH (ref <6.1)

## 2021-04-05 ENCOUNTER — Ambulatory Visit: Payer: Medicare Other | Attending: Family Medicine | Admitting: Physical Therapy

## 2021-04-05 ENCOUNTER — Encounter: Payer: Self-pay | Admitting: Physical Therapy

## 2021-04-05 ENCOUNTER — Other Ambulatory Visit: Payer: Self-pay

## 2021-04-05 DIAGNOSIS — R279 Unspecified lack of coordination: Secondary | ICD-10-CM | POA: Diagnosis not present

## 2021-04-05 DIAGNOSIS — R296 Repeated falls: Secondary | ICD-10-CM | POA: Diagnosis not present

## 2021-04-05 DIAGNOSIS — M6281 Muscle weakness (generalized): Secondary | ICD-10-CM | POA: Diagnosis not present

## 2021-04-05 DIAGNOSIS — R2689 Other abnormalities of gait and mobility: Secondary | ICD-10-CM | POA: Diagnosis not present

## 2021-04-05 NOTE — Therapy (Signed)
[facility] @ [place], [place], [address] Phone: [phone]   Fax:  [phone]  Physical Therapy Treatment  Patient Details  Name: [name] [name] MRN: [id number] Date of Birth: Feb 12, 1961 Referring Provider (PT): [name], MD   Encounter Date: 04/05/2021   PT End of Session - 04/05/21 1009     Visit Number 12    Date for PT Re-Evaluation 05/17/21    Authorization Type [employer]    Authorization - Visit Number 12    Authorization - Number of Visits 60    Progress Note Due on Visit 22    PT Start Time 0852    PT Stop Time 0934    PT Time Calculation (min) 42 min    Equipment Utilized During Treatment Gait belt    Activity Tolerance Patient tolerated treatment well    Behavior During Therapy [facility] for tasks assessed/performed             Past Medical History:  Diagnosis Date   Adenomatous colon polyp 10/1999   Anxiety    Arthritis    neck, yoga helps.   Cataract    Complex partial seizure ([place])    last seizure was 08-30-2018   Crohn's disease of small and large intestines ([place]) 1999   Depression    Diabetes mellitus    no meds at this time 10-24-17   Esophageal stricture    External hemorrhoids    GERD (gastroesophageal reflux disease)    Glaucoma    History of kidney stones    3 large stones still present   Hyperlipemia    Hypertension    resolved with weight loss    Inguinal hernia, bilateral 12/2019   Liver cirrhosis secondary to NASH ([place])    Migraines    Osteoporosis    PONV (postoperative nausea and vomiting)    PTSD (post-traumatic stress disorder)    Renal cyst, acquired, left 07/08/2017   Skin cancer    squamous cell multiple; [facility]; followed every 3 months.   Sleep apnea    CPAP machine, uses nightly   Staphylococcus aureus bacteremia with sepsis ([place]) 05/28/2017   MRSA 2012    Past Surgical History:  Procedure Laterality Date   CATARACT EXTRACTION Bilateral     COLONOSCOPY     DEBRIDEMENT MANDIBLE N/A 12/07/2020   Procedure: DEBRIDEMENT OF MANDIBLE;  Surgeon: [name], DMD;  Location: [facility];  Service: Oral Surgery;  Laterality: N/A;   ELBOW SURGERY  2012   elbow MRSA infection    EYE SURGERY     cataracts removed, /w IOL   GROSS TEETH DEBRIDEMENT N/A 12/29/2020   Procedure: MANDIBULAR  DEBRIDEMENT;  Surgeon: [name], DMD;  Location: [facility];  Service: Dentistry;  Laterality: N/A;   INCISION AND DRAINAGE ABSCESS N/A 12/07/2020   Procedure: INCISION AND DRAINAGE MANDIBULAR ABSCESS;  Surgeon: [name], DMD;  Location: [facility];  Service: Oral Surgery;  Laterality: N/A;   INGUINAL HERNIA REPAIR Right 01/14/2020   Procedure: OPEN RIGHT HERNIA REPAIR INGUINAL WITH MESH;  Surgeon: [name], MD;  Location: [facility];  Service: General;  Laterality: Right;   INSERTION OF MESH N/A 07/01/2016   Procedure: INSERTION OF MESH;  Surgeon: [name], MD;  Location: [facility];  Service: General;  Laterality: N/A;   IR URETERAL STENT LEFT NEW ACCESS W/O SEP NEPHROSTOMY CATH  02/02/2018   NEPHROLITHOTOMY Left 02/02/2018   Procedure: NEPHROLITHOTOMY PERCUTANEOUS;  Surgeon: [name], MD;  Location: [facility];  Service: Urology;  Laterality: Left;   POLYPECTOMY     SCALP LACERATION REPAIR Right 10/16/2017   From fall/staples   SHOULDER ARTHROSCOPY Right 03/05/2018   Procedure: ARTHROSCOPY SHOULDER AND OPEN DISTAL CLAVICLE EXCISION;  Surgeon: [name], MD;  Location: [facility];  Service: Orthopedics;  Laterality: Right;   SHOULDER SURGERY     SINUS SURGERY WITH INSTATRAK     TEE WITHOUT CARDIOVERSION N/A 05/09/2017   Procedure: TRANSESOPHAGEAL ECHOCARDIOGRAM (TEE);  Surgeon: [name], MD;  Location: [facility];  Service: Cardiovascular;  Laterality: N/A;   TOOTH EXTRACTION N/A 12/07/2020   Procedure: DENTAL EXTRACTIONS OF TEETH TWENTY TO THIRTY;  Surgeon: [name], DMD;  Location: [facility];  Service: Oral Surgery;   Laterality: N/A;   TOOTH EXTRACTION N/A 12/29/2020   Procedure: DENTAL RESTORATION/EXTRACTIONS;  Surgeon: [name], DMD;  Location: [facility];  Service: Dentistry;  Laterality: N/A;   TRANSTHORACIC ECHOCARDIOGRAM  02/22/2011   EF 55-65%; increased pattern of LVH with mild conc hypertrophy, abnormal relaxation & increased filling pressure (grade 2 diastolic dysfunction); atrial septum thickened (lipomatous hypertrophy)   UMBILICAL HERNIA REPAIR N/A 07/01/2016   Procedure: UMBILICAL HERNIA REPAIR WITH MESH;  Surgeon: [name], MD;  Location: [facility];  Service: General;  Laterality: N/A;   UPPER GASTROINTESTINAL ENDOSCOPY     VASECTOMY      There were no vitals filed for this visit.   Subjective Assessment - 04/05/21 0855     Subjective Pt had some surgeries over the last week or so. He is healing well from this. He feels that he is doing ok and leaning towards doing good. He had one fall in the last couple of weeks when he was walking without any AD.    Pertinent History on disability, has memory problems, liver cirrhosis, Ch[place], osteoporosis with multiple fractures including C6, thrombocytopenia, gluacoma (loss of peripheral vision).  Has lost 75lb through walking but concerned about safety    Limitations Walking    How long can you walk comfortably? 2.5 miles with walking sticks    Diagnostic tests C6 fracture and ligamentum flavum tear - non-surgical    Patient Stated Goals work on coming down and up stairs, balance    Currently in Pain? No/denies                [facility] PT Assessment - 04/05/21 0001       Assessment   Medical Diagnosis R29.6 (ICD-10-CM) - Repeated falls    Referring Provider (PT) [name], MD    Hand Dominance Right    Next MD Visit Oct      Precautions   Precautions Fall      Restrictions   Weight Bearing Restrictions No      Balance Screen   Has the patient fallen in the past 6 months Yes    How many times? 1    Has the patient had a  decrease in activity level because of a fear of falling?  Yes    Is the patient reluctant to leave their home because of a fear of falling?  No      Home Environment   Living Environment Private residence    Living Arrangements Spouse/significant other;Children    Entrance Stairs-Number of Steps 2    Entrance Stairs-Rails Left      Prior Function   Level of Independence Independent with household mobility without device;Independent with community mobility without device;Independent;Other (  comment)      Strength   Overall Strength Comments hip abduction: Rt and Lt 4-/5      Ambulation/Gait   Ambulation/Gait Yes    Ambulation/Gait Assistance 5: Supervision;4: Min guard    Assistive device None    Stairs Yes    Stairs Assistance 5: Supervision    Stair Management Technique One rail Left;Alternating pattern;Step to pattern    Number of Stairs 4    Height of Stairs 6    Gait Comments pt using Lt step to pattern going up and down      Standardized Balance Assessment   Standardized Balance Assessment Dynamic Gait Index    Five times sit to stand comments  18 sec, no UE      Dynamic Gait Index   Level Surface Moderate Impairment    Change in Gait Speed Mild Impairment    Gait with Horizontal Head Turns Severe Impairment    Gait with Vertical Head Turns Severe Impairment    Gait and Pivot Turn Mild Impairment    Step Over Obstacle Severe Impairment    Step Around Obstacles Mild Impairment    Steps Moderate Impairment    Total Score 8    DGI comment: using walking sticks                                    PT Education - 04/05/21 1008     Education Details re-evaluation findings/POC moving forward    Person(s) Educated Patient    Methods Explanation    Comprehension Verbalized understanding              PT Short Term Goals - 04/05/21 0926       PT SHORT TERM GOAL #1   Title Patient will complete 3 minute walk test using LRAD with only  supervision to indicate decreased fall risk.    Status Achieved      PT SHORT TERM GOAL #2   Title Pt will verbalize and demo safe strategies for stair pattern (step to for descending) and gait patterns/pace (slow down) without AD    Status Achieved      PT SHORT TERM GOAL #3   Title PT and Pt will explore gait with walker and make decisions about appropriateness of use for improved safety.    Baseline using walking sticks, has had 1 fall since starting PT    Status Achieved      PT SHORT TERM GOAL #4   Title Pt ind with initial HEP    Status Achieved               PT Long Term Goals - 04/05/21 0927       PT LONG TERM GOAL #1   Title Improve 5x sit to stand to </= 14 sec.    Baseline 18 sec, no UE    Status On-going      PT LONG TERM GOAL #2   Title Pt will demo up/down stairs x 3 rounds (4 6 inch steps) using bil UEs and safe strategy to demo improved safety on stairs    Baseline step to pattern, no handrails, 1 LOB having to put hand on the wall    Status Partially Met      PT LONG TERM GOAL #3   Title Pt will demo safety/no LOB with community ambulation for 6' walk test, curb up/down and grassy surface with supervision  using least restrictive AD or no AD.    Baseline 6' walk test met but LOB stepping off grassy curb    Status On-going      PT LONG TERM GOAL #4   Title Pt will report at least 50% more confidence in balance with daily tasks    Baseline 40%    Status On-going      PT LONG TERM GOAL #5   Title Ind with advanced HEP and understand how to safely progress    Baseline 20/30% in the last couple of weeks    Status On-going                   Plan - 04/05/21 1010     Clinical Impression Statement Pt was re-evaluated this visit after being out of PT for 2-3 weeks due to several procedures. He has been using his walking sticks and finds this makes a big difference in his steadiness with ambulation. Pt notes 1 fall over the last month, but this was  when he was attempting to walk without his AD. Pt has met all short term goals at this point and his stair negotiation has improved, as he is able to go up and down without a handrail with increased time to complete safely. Pt's DGI has fluctuated since beginning PT. It is currently an 8 which indicates he is at a severe risk of falling. Pt struggles with reciprocal pattern during ambulation, although improved with his walking sticks. He also has difficulty navigating around and over obstacles. Pt's hip abductor weakness likely contributes to his unsteadiness, and he would benefit from exercise addressing this. Pt would benefit from a continuation of skilled PT to further promote LE strength improvements and to address his static and dynamic balance limitations in order to decrease his risk of falling and injury.    Comorbidities seizures, memory deficits, osteoporosis with Hx of multiple fractures    PT Frequency 2x / week    PT Duration 6 weeks    PT Treatment/Interventions ADLs/Self Care Home Management;Gait training;Stair training;Functional mobility training;Therapeutic activities;Therapeutic exercise;Balance training;Neuromuscular re-education;Manual techniques;Patient/family education;Passive range of motion    PT Next Visit Plan gait belt throughout session, if weather allows-6MWT for goal beginning of session; work on curb training with walking sticks, hip abductor strength    PT [place] and Agree with Plan of Care Patient    Family Member Consulted Pt's wife             Patient will benefit from skilled therapeutic intervention in order to improve the following deficits and impairments:     Visit Diagnosis: Muscle weakness (generalized)  Lack of coordination  Repeated falls  Other abnormalities of gait and mobility     Problem List Patient Active Problem List   Diagnosis Date Noted   Liver cirrhosis secondary to NASH ([place])    GERD  (gastroesophageal reflux disease)    Pancytopenia ([place])    Osteomyelitis of jaw 12/29/2020   Acute osteomyelitis of mandible 12/26/2020   S/P inguinal hernia repair 01/14/2020   At high risk for falls    S/P shoulder surgery 03/05/2018   Excessive daytime sleepiness 02/18/2018   Crohn's disease of colon with rectal bleeding ([place]) 02/18/2018   Blood in stool 02/18/2018   Iron deficiency anemia due to chronic blood loss 02/18/2018   Iron deficiency anemia due to sideropenic dysphagia 02/18/2018   Renal calculus, left 02/02/2018   History of sepsis 12/16/2017  Renal cyst, acquired, left 07/08/2017   Frequent falls 07/08/2017   Renal lesion 05/28/2017   MSSA bacteremia 05/06/2017   Confusion 04/02/2017   Syncope 08/30/2016   Depression 04/30/2016   Memory loss 04/30/2016   OSA on CPAP 10/31/2015   Nonepileptic episode ([place]) 04/25/2015   Seizure ([place]) 04/04/2013   Abnormal EKG 03/12/2013   DOE (dyspnea on exertion) 03/12/2013   Complex partial seizure ([place]) 04/19/2012   DM2 (diabetes mellitus, type 2) ([place]) 09/25/2011   OTHER DYSPHAGIA 03/27/2009   Cough 11/10/2008   GERD 12/07/2007   COLONIC POLYPS, ADENOMATOUS, HX OF 12/07/2007   EXTERNAL HEMORRHOIDS 09/11/2007   CROHN'S DISEASE, LARGE AND SMALL INTESTINES 09/11/2007   OSTEOPOROSIS 09/11/2007   HYPERLIPIDEMIA NEC/NOS 02/27/2007   Essential hypertension 02/27/2007   11:29 AM,04/05/21 Sherol Dade PT, DPT [facility] @ [place], [place], [address] Phone: [phone]   Fax:  [phone]  Name: ARMAAN POND MRN: [id number] Date of Birth: Dec 25, 1960

## 2021-04-08 LAB — GI PROFILE, STOOL, PCR

## 2021-04-10 DIAGNOSIS — K50918 Crohn's disease, unspecified, with other complication: Secondary | ICD-10-CM | POA: Diagnosis not present

## 2021-04-10 DIAGNOSIS — E119 Type 2 diabetes mellitus without complications: Secondary | ICD-10-CM | POA: Diagnosis not present

## 2021-04-10 DIAGNOSIS — M869 Osteomyelitis, unspecified: Secondary | ICD-10-CM | POA: Diagnosis not present

## 2021-04-10 DIAGNOSIS — Z4682 Encounter for fitting and adjustment of non-vascular catheter: Secondary | ICD-10-CM | POA: Diagnosis not present

## 2021-04-10 DIAGNOSIS — Z7984 Long term (current) use of oral hypoglycemic drugs: Secondary | ICD-10-CM | POA: Diagnosis not present

## 2021-04-10 DIAGNOSIS — R140 Abdominal distension (gaseous): Secondary | ICD-10-CM | POA: Diagnosis not present

## 2021-04-10 DIAGNOSIS — I10 Essential (primary) hypertension: Secondary | ICD-10-CM | POA: Diagnosis not present

## 2021-04-10 DIAGNOSIS — D8481 Immunodeficiency due to conditions classified elsewhere: Secondary | ICD-10-CM | POA: Diagnosis not present

## 2021-04-10 DIAGNOSIS — E872 Acidosis, unspecified: Secondary | ICD-10-CM | POA: Diagnosis not present

## 2021-04-10 DIAGNOSIS — R918 Other nonspecific abnormal finding of lung field: Secondary | ICD-10-CM | POA: Diagnosis not present

## 2021-04-10 DIAGNOSIS — N200 Calculus of kidney: Secondary | ICD-10-CM | POA: Diagnosis not present

## 2021-04-10 DIAGNOSIS — R569 Unspecified convulsions: Secondary | ICD-10-CM | POA: Diagnosis not present

## 2021-04-10 DIAGNOSIS — Z20822 Contact with and (suspected) exposure to covid-19: Secondary | ICD-10-CM | POA: Diagnosis not present

## 2021-04-10 DIAGNOSIS — K7581 Nonalcoholic steatohepatitis (NASH): Secondary | ICD-10-CM | POA: Diagnosis not present

## 2021-04-10 DIAGNOSIS — C6961 Malignant neoplasm of right orbit: Secondary | ICD-10-CM | POA: Diagnosis not present

## 2021-04-10 DIAGNOSIS — C4492 Squamous cell carcinoma of skin, unspecified: Secondary | ICD-10-CM | POA: Diagnosis not present

## 2021-04-10 DIAGNOSIS — Z452 Encounter for adjustment and management of vascular access device: Secondary | ICD-10-CM | POA: Diagnosis not present

## 2021-04-10 DIAGNOSIS — G40209 Localization-related (focal) (partial) symptomatic epilepsy and epileptic syndromes with complex partial seizures, not intractable, without status epilepticus: Secondary | ICD-10-CM | POA: Diagnosis not present

## 2021-04-10 DIAGNOSIS — Z9911 Dependence on respirator [ventilator] status: Secondary | ICD-10-CM | POA: Diagnosis not present

## 2021-04-10 DIAGNOSIS — Z888 Allergy status to other drugs, medicaments and biological substances status: Secondary | ICD-10-CM | POA: Diagnosis not present

## 2021-04-10 DIAGNOSIS — E1169 Type 2 diabetes mellitus with other specified complication: Secondary | ICD-10-CM | POA: Diagnosis not present

## 2021-04-10 DIAGNOSIS — Z86010 Personal history of colonic polyps: Secondary | ICD-10-CM | POA: Diagnosis not present

## 2021-04-10 DIAGNOSIS — J9602 Acute respiratory failure with hypercapnia: Secondary | ICD-10-CM | POA: Diagnosis not present

## 2021-04-10 DIAGNOSIS — F32A Depression, unspecified: Secondary | ICD-10-CM | POA: Diagnosis not present

## 2021-04-10 DIAGNOSIS — Z880 Allergy status to penicillin: Secondary | ICD-10-CM | POA: Diagnosis not present

## 2021-04-10 DIAGNOSIS — G40909 Epilepsy, unspecified, not intractable, without status epilepticus: Secondary | ICD-10-CM | POA: Diagnosis not present

## 2021-04-10 DIAGNOSIS — R161 Splenomegaly, not elsewhere classified: Secondary | ICD-10-CM | POA: Diagnosis not present

## 2021-04-10 DIAGNOSIS — E785 Hyperlipidemia, unspecified: Secondary | ICD-10-CM | POA: Diagnosis not present

## 2021-04-10 DIAGNOSIS — Z79899 Other long term (current) drug therapy: Secondary | ICD-10-CM | POA: Diagnosis not present

## 2021-04-10 DIAGNOSIS — C44329 Squamous cell carcinoma of skin of other parts of face: Secondary | ICD-10-CM | POA: Diagnosis not present

## 2021-04-10 DIAGNOSIS — K219 Gastro-esophageal reflux disease without esophagitis: Secondary | ICD-10-CM | POA: Diagnosis not present

## 2021-04-10 DIAGNOSIS — Z882 Allergy status to sulfonamides status: Secondary | ICD-10-CM | POA: Diagnosis not present

## 2021-04-10 DIAGNOSIS — Z7982 Long term (current) use of aspirin: Secondary | ICD-10-CM | POA: Diagnosis not present

## 2021-04-10 DIAGNOSIS — E1165 Type 2 diabetes mellitus with hyperglycemia: Secondary | ICD-10-CM | POA: Diagnosis not present

## 2021-04-10 DIAGNOSIS — Z9889 Other specified postprocedural states: Secondary | ICD-10-CM | POA: Diagnosis not present

## 2021-04-10 DIAGNOSIS — R404 Transient alteration of awareness: Secondary | ICD-10-CM | POA: Diagnosis not present

## 2021-04-10 DIAGNOSIS — G4733 Obstructive sleep apnea (adult) (pediatric): Secondary | ICD-10-CM | POA: Diagnosis not present

## 2021-04-17 DIAGNOSIS — M272 Inflammatory conditions of jaws: Secondary | ICD-10-CM | POA: Diagnosis not present

## 2021-04-20 DIAGNOSIS — R3121 Asymptomatic microscopic hematuria: Secondary | ICD-10-CM | POA: Diagnosis not present

## 2021-04-20 DIAGNOSIS — N200 Calculus of kidney: Secondary | ICD-10-CM | POA: Diagnosis not present

## 2021-04-20 DIAGNOSIS — N3941 Urge incontinence: Secondary | ICD-10-CM | POA: Diagnosis not present

## 2021-04-23 DIAGNOSIS — C801 Malignant (primary) neoplasm, unspecified: Secondary | ICD-10-CM | POA: Diagnosis not present

## 2021-04-23 DIAGNOSIS — R296 Repeated falls: Secondary | ICD-10-CM | POA: Diagnosis not present

## 2021-04-23 DIAGNOSIS — Z09 Encounter for follow-up examination after completed treatment for conditions other than malignant neoplasm: Secondary | ICD-10-CM | POA: Diagnosis not present

## 2021-04-23 DIAGNOSIS — K5090 Crohn's disease, unspecified, without complications: Secondary | ICD-10-CM | POA: Diagnosis not present

## 2021-04-23 DIAGNOSIS — K7460 Unspecified cirrhosis of liver: Secondary | ICD-10-CM | POA: Diagnosis not present

## 2021-04-23 DIAGNOSIS — D61818 Other pancytopenia: Secondary | ICD-10-CM | POA: Diagnosis not present

## 2021-04-24 DIAGNOSIS — C44329 Squamous cell carcinoma of skin of other parts of face: Secondary | ICD-10-CM | POA: Diagnosis not present

## 2021-04-24 DIAGNOSIS — C4402 Squamous cell carcinoma of skin of lip: Secondary | ICD-10-CM | POA: Diagnosis not present

## 2021-04-24 DIAGNOSIS — C44622 Squamous cell carcinoma of skin of right upper limb, including shoulder: Secondary | ICD-10-CM | POA: Diagnosis not present

## 2021-04-24 DIAGNOSIS — D040 Carcinoma in situ of skin of lip: Secondary | ICD-10-CM | POA: Diagnosis not present

## 2021-04-24 DIAGNOSIS — Z85828 Personal history of other malignant neoplasm of skin: Secondary | ICD-10-CM | POA: Diagnosis not present

## 2021-04-24 DIAGNOSIS — C44629 Squamous cell carcinoma of skin of left upper limb, including shoulder: Secondary | ICD-10-CM | POA: Diagnosis not present

## 2021-04-24 DIAGNOSIS — L814 Other melanin hyperpigmentation: Secondary | ICD-10-CM | POA: Diagnosis not present

## 2021-04-24 DIAGNOSIS — L821 Other seborrheic keratosis: Secondary | ICD-10-CM | POA: Diagnosis not present

## 2021-04-27 ENCOUNTER — Ambulatory Visit
Admission: RE | Admit: 2021-04-27 | Discharge: 2021-04-27 | Disposition: A | Discharge status: Home / Self Care | Payer: Self-pay | Source: Ambulatory Visit | Attending: Radiation Oncology | Admitting: Radiation Oncology

## 2021-04-27 ENCOUNTER — Other Ambulatory Visit: Payer: Self-pay | Admitting: Radiation Oncology

## 2021-04-27 DIAGNOSIS — H532 Diplopia: Secondary | ICD-10-CM | POA: Diagnosis not present

## 2021-04-27 DIAGNOSIS — D0439 Carcinoma in situ of skin of other parts of face: Secondary | ICD-10-CM

## 2021-04-27 DIAGNOSIS — H04123 Dry eye syndrome of bilateral lacrimal glands: Secondary | ICD-10-CM | POA: Diagnosis not present

## 2021-05-01 ENCOUNTER — Ambulatory Visit: Payer: Medicare Other | Attending: Family Medicine | Admitting: Physical Therapy

## 2021-05-01 ENCOUNTER — Encounter: Payer: Self-pay | Admitting: Physical Therapy

## 2021-05-01 ENCOUNTER — Other Ambulatory Visit: Payer: Self-pay

## 2021-05-01 DIAGNOSIS — R296 Repeated falls: Secondary | ICD-10-CM | POA: Insufficient documentation

## 2021-05-01 DIAGNOSIS — R2689 Other abnormalities of gait and mobility: Secondary | ICD-10-CM | POA: Diagnosis not present

## 2021-05-01 DIAGNOSIS — R279 Unspecified lack of coordination: Secondary | ICD-10-CM | POA: Diagnosis not present

## 2021-05-01 DIAGNOSIS — F332 Major depressive disorder, recurrent severe without psychotic features: Secondary | ICD-10-CM | POA: Diagnosis not present

## 2021-05-01 DIAGNOSIS — M6281 Muscle weakness (generalized): Secondary | ICD-10-CM | POA: Diagnosis not present

## 2021-05-01 DIAGNOSIS — F4312 Post-traumatic stress disorder, chronic: Secondary | ICD-10-CM | POA: Diagnosis not present

## 2021-05-01 DIAGNOSIS — F411 Generalized anxiety disorder: Secondary | ICD-10-CM | POA: Diagnosis not present

## 2021-05-01 NOTE — Therapy (Signed)
[facility] @ [place], [place], [address] Phone: [phone]   Fax:  [phone]  Physical Therapy Treatment  Patient Details  Name: [name] [name] MRN: [id number] Date of Birth: 11/02/1960 Referring Provider (PT): [name], MD   Encounter Date: 05/01/2021   PT End of Session - 05/01/21 1323     Visit Number 13    Date for PT Re-Evaluation 05/17/21    Authorization Type [employer] Medicare    Authorization - Visit Number 13    Authorization - Number of Visits 60    Progress Note Due on Visit 20    PT Start Time 1240   Pt late   PT Stop Time 1318    PT Time Calculation (min) 38 min    Equipment Utilized During Treatment Gait belt    Activity Tolerance Patient tolerated treatment well    Behavior During Therapy [facility] for tasks assessed/performed             Past Medical History:  Diagnosis Date   Adenomatous colon polyp 10/1999   Anxiety    Arthritis    neck, yoga helps.   Cataract    Complex partial seizure ([place])    last seizure was 08-30-2018   Crohn's disease of small and large intestines ([place]) 1999   Depression    Diabetes mellitus    no meds at this time 10-24-17   Esophageal stricture    External hemorrhoids    GERD (gastroesophageal reflux disease)    Glaucoma    History of kidney stones    3 large stones still present   Hyperlipemia    Hypertension    resolved with weight loss    Inguinal hernia, bilateral 12/2019   Liver cirrhosis secondary to NASH ([place])    Migraines    Osteoporosis    PONV (postoperative nausea and vomiting)    PTSD (post-traumatic stress disorder)    Renal cyst, acquired, left 07/08/2017   Skin cancer    squamous cell multiple; [facility]; followed every 3 months.   Sleep apnea    CPAP machine, uses nightly   Staphylococcus aureus bacteremia with sepsis ([place]) 05/28/2017   MRSA 2012    Past Surgical History:  Procedure Laterality Date   CATARACT EXTRACTION  Bilateral    COLONOSCOPY     DEBRIDEMENT MANDIBLE N/A 12/07/2020   Procedure: DEBRIDEMENT OF MANDIBLE;  Surgeon: [name], DMD;  Location: [facility];  Service: Oral Surgery;  Laterality: N/A;   ELBOW SURGERY  2012   elbow MRSA infection    EYE SURGERY     cataracts removed, /w IOL   GROSS TEETH DEBRIDEMENT N/A 12/29/2020   Procedure: MANDIBULAR  DEBRIDEMENT;  Surgeon: [name], DMD;  Location: [facility];  Service: Dentistry;  Laterality: N/A;   INCISION AND DRAINAGE ABSCESS N/A 12/07/2020   Procedure: INCISION AND DRAINAGE MANDIBULAR ABSCESS;  Surgeon: [name], DMD;  Location: [facility];  Service: Oral Surgery;  Laterality: N/A;   INGUINAL HERNIA REPAIR Right 01/14/2020   Procedure: OPEN RIGHT HERNIA REPAIR INGUINAL WITH MESH;  Surgeon: [name], MD;  Location: [facility];  Service: General;  Laterality: Right;   INSERTION OF MESH N/A 07/01/2016   Procedure: INSERTION OF MESH;  Surgeon: [name], MD;  Location: [facility];  Service: General;  Laterality: N/A;   IR URETERAL STENT LEFT NEW ACCESS W/O SEP NEPHROSTOMY CATH  02/02/2018   NEPHROLITHOTOMY Left 02/02/2018   Procedure: NEPHROLITHOTOMY  PERCUTANEOUS;  Surgeon: [name], MD;  Location: [facility];  Service: Urology;  Laterality: Left;   POLYPECTOMY     SCALP LACERATION REPAIR Right 10/16/2017   From fall/staples   SHOULDER ARTHROSCOPY Right 03/05/2018   Procedure: ARTHROSCOPY SHOULDER AND OPEN DISTAL CLAVICLE EXCISION;  Surgeon: [name], MD;  Location: [facility];  Service: Orthopedics;  Laterality: Right;   SHOULDER SURGERY     SINUS SURGERY WITH INSTATRAK     TEE WITHOUT CARDIOVERSION N/A 05/09/2017   Procedure: TRANSESOPHAGEAL ECHOCARDIOGRAM (TEE);  Surgeon: [name], MD;  Location: [facility];  Service: Cardiovascular;  Laterality: N/A;   TOOTH EXTRACTION N/A 12/07/2020   Procedure: DENTAL EXTRACTIONS OF TEETH TWENTY TO THIRTY;  Surgeon: [name], DMD;  Location: [facility];  Service: Oral  Surgery;  Laterality: N/A;   TOOTH EXTRACTION N/A 12/29/2020   Procedure: DENTAL RESTORATION/EXTRACTIONS;  Surgeon: [name], DMD;  Location: [facility];  Service: Dentistry;  Laterality: N/A;   TRANSTHORACIC ECHOCARDIOGRAM  02/22/2011   EF 55-65%; increased pattern of LVH with mild conc hypertrophy, abnormal relaxation & increased filling pressure (grade 2 diastolic dysfunction); atrial septum thickened (lipomatous hypertrophy)   UMBILICAL HERNIA REPAIR N/A 07/01/2016   Procedure: UMBILICAL HERNIA REPAIR WITH MESH;  Surgeon: [name], MD;  Location: [facility];  Service: General;  Laterality: N/A;   UPPER GASTROINTESTINAL ENDOSCOPY     VASECTOMY      There were no vitals filed for this visit.   Subjective Assessment - 05/01/21 1244     Subjective I have had extensive skin surgery and plastic surgery since last here.  I am cleared to exercise.  I finally walked 2 miles yesterday and did well.    Pertinent History on disability, has memory problems, liver cirrhosis, [place], osteoporosis with multiple fractures including C6, thrombocytopenia, gluacoma (loss of peripheral vision).  Has lost 75lb through walking but concerned about safety    Limitations Walking    How long can you walk comfortably? 2.5 miles with walking sticks    Diagnostic tests C6 fracture and ligamentum flavum tear - non-surgical    Patient Stated Goals work on coming down and up stairs, balance    Currently in Pain? No/denies                               [facility] Adult PT Treatment/Exercise - 05/01/21 0001       Exercises   Exercises Lumbar;Knee/Hip      Lumbar Exercises: Aerobic   Nustep L7 x 5' PT present to monitor and discuss status      Knee/Hip Exercises: Standing   Forward Step Up Both;1 set;10 reps;Step Height: 6";Hand Hold: 2    Forward Step Up Limitations 1x10 each leading with Rt and Lt foot, VC to not pull with UEs on rails      Knee/Hip Exercises: Seated   Sit to Sand 10  reps;without UE support;2 sets   hold 5lb 2nd set     Shoulder Exercises: Power [employer] 20 reps    Row Limitations seated in chair, 2x10, 25lb, 35lb 2nd set                 Balance Exercises - 05/01/21 0001       Balance Exercises: Standing   Standing Eyes Closed Narrow base of support (BOS);Foam/compliant surface;30 secs   no UE support close supervision   SLS Foam/compliant surface;Upper extremity support  2;20 secs;Eyes closed    Stepping Strategy Anterior;5 reps   with opp arm reach, hold 5 sec, Rt and Lt   Retro Gait 3 reps    Sidestepping 5 reps   single and double step inside parallel bars   Other Standing Exercises box stepping x 5 rounds each, gait belt, min guard                  PT Short Term Goals - 04/05/21 0926       PT SHORT TERM GOAL #1   Title Patient will complete 3 minute walk test using LRAD with only supervision to indicate decreased fall risk.    Status Achieved      PT SHORT TERM GOAL #2   Title Pt will verbalize and demo safe strategies for stair pattern (step to for descending) and gait patterns/pace (slow down) without AD    Status Achieved      PT SHORT TERM GOAL #3   Title PT and Pt will explore gait with walker and make decisions about appropriateness of use for improved safety.    Baseline using walking sticks, has had 1 fall since starting PT    Status Achieved      PT SHORT TERM GOAL #4   Title Pt ind with initial HEP    Status Achieved               PT Long Term Goals - 04/05/21 0927       PT LONG TERM GOAL #1   Title Improve 5x sit to stand to </= 14 sec.    Baseline 18 sec, no UE    Status On-going      PT LONG TERM GOAL #2   Title Pt will demo up/down stairs x 3 rounds (4 6 inch steps) using bil UEs and safe strategy to demo improved safety on stairs    Baseline step to pattern, no handrails, 1 LOB having to put hand on the wall    Status Partially Met      PT LONG TERM GOAL #3   Title Pt will demo  safety/no LOB with community ambulation for 6' walk test, curb up/down and grassy surface with supervision using least restrictive AD or no AD.    Baseline 6' walk test met but LOB stepping off grassy curb    Status On-going      PT LONG TERM GOAL #4   Title Pt will report at least 50% more confidence in balance with daily tasks    Baseline 40%    Status On-going      PT LONG TERM GOAL #5   Title Ind with advanced HEP and understand how to safely progress    Baseline 20/30% in the last couple of weeks    Status On-going                   Plan - 05/01/21 1324     Clinical Impression Statement Pt has missed PT and had limited activity due to several skin cancer surgeries with reconstruction.  He walked 2 miles for first time in several weeks yesterday and did well with walking sticks.  He feels he has gone backwards in strength, endurance and balance confidence since being in hospital.  He was able to perform multi-directional stepping and gait with use of gait belt and min guard with several LOB.  He has difficulty coordinating opp arm/leg for reciprocal step-reaching.  He was able to perform balance  challenges on compliant foam pad today with varied level of UE support depending on task.  PT scaled back weights for sit to stands to body weight and 5lb with Pt report of appropriate level of fatigue with this (used to hold 15lb).  Pt had intermittent dizziness throughout session.  Continue along POC with ongoing assessment and close supervision of response to treatment.    Comorbidities seizures, memory deficits, osteoporosis with Hx of multiple fractures    Rehab Potential Good    PT Frequency 2x / week    PT Duration 6 weeks    PT Treatment/Interventions ADLs/Self Care Home Management;Gait training;Stair training;Functional mobility training;Therapeutic activities;Therapeutic exercise;Balance training;Neuromuscular re-education;Manual techniques;Patient/family education;Passive range  of motion    PT Next Visit Plan gait belt throughout session, balance, reciprocal UE/LE movement, functional strength    PT Home Exercise Plan [id number]    Consulted and Agree with Plan of Care Patient             Patient will benefit from skilled therapeutic intervention in order to improve the following deficits and impairments:     Visit Diagnosis: Muscle weakness (generalized)  Repeated falls  Other abnormalities of gait and mobility  Unspecified lack of coordination     Problem List Patient Active Problem List   Diagnosis Date Noted   Liver cirrhosis secondary to NASH ([place])    GERD (gastroesophageal reflux disease)    Pancytopenia ([facility])    Osteomyelitis of jaw 12/29/2020   Acute osteomyelitis of mandible 12/26/2020   S/P inguinal hernia repair 01/14/2020   At high risk for falls    S/P shoulder surgery 03/05/2018   Excessive daytime sleepiness 02/18/2018   Crohn's disease of colon with rectal bleeding ([place]) 02/18/2018   Blood in stool 02/18/2018   Iron deficiency anemia due to chronic blood loss 02/18/2018   Iron deficiency anemia due to sideropenic dysphagia 02/18/2018   Renal calculus, left 02/02/2018   History of sepsis 12/16/2017   Renal cyst, acquired, left 07/08/2017   Frequent falls 07/08/2017   Renal lesion 05/28/2017   MSSA bacteremia 05/06/2017   Confusion 04/02/2017   Syncope 08/30/2016   Depression 04/30/2016   Memory loss 04/30/2016   OSA on CPAP 10/31/2015   Nonepileptic episode ([place]) 04/25/2015   Seizure ([place]) 04/04/2013   Abnormal EKG 03/12/2013   DOE (dyspnea on exertion) 03/12/2013   Complex partial seizure ([place]) 04/19/2012   DM2 (diabetes mellitus, type 2) ([place]) 09/25/2011   OTHER DYSPHAGIA 03/27/2009   Cough 11/10/2008   GERD 12/07/2007   COLONIC POLYPS, ADENOMATOUS, HX OF 12/07/2007   EXTERNAL HEMORRHOIDS 09/11/2007   CROHN'S DISEASE, LARGE AND SMALL INTESTINES 09/11/2007   OSTEOPOROSIS 09/11/2007   HYPERLIPIDEMIA NEC/NOS  02/27/2007   Essential hypertension 02/27/2007    Baruch Merl, PT 05/01/21 1:29 PM   [facility] @ [place], [place], [address] Phone: [phone]   Fax:  [phone]  Name: [name] [name] MRN: [id number] Date of Birth: 10/25/1960

## 2021-05-02 ENCOUNTER — Telehealth: Payer: Self-pay | Admitting: Hematology and Oncology

## 2021-05-02 ENCOUNTER — Other Ambulatory Visit: Payer: Self-pay

## 2021-05-02 DIAGNOSIS — C4492 Squamous cell carcinoma of skin, unspecified: Secondary | ICD-10-CM

## 2021-05-02 NOTE — Progress Notes (Signed)
Oncology Nurse Navigator Documentation   Placed introductory call to new referral patient [name] [name]. I spoke with his wife, [name].  Introduced myself as the H&N oncology nurse navigator that works with Dr. Isidore Moos to whom he has been referred by Dr. Charolett Bumpers at [facility]. She confirmed understanding of referral. Briefly explained my role as his/their navigator, provided my contact information.  Confirmed understanding of upcoming appts and [place] location, explained arrival and registration process. I explained the possibility of a dental evaluation prior to starting RT, indicated he would be contacted by [facility] to arrange an appt.   I encouraged them to call with questions/concerns as he moves forward with appts and procedures.   She verbalized understanding of information provided, expressed appreciation for my call.   Navigator Initial Assessment Employment Status: he is not currently working.  Currently on FMLA / STD: Living Situation: he lives with his spouse.  Support System: spouse, family PCP: [name] MD PCD: Financial Concerns:yes Transportation Needs: no Sensory Deficits:no Language Barriers/Interpreter Needed:  no Ambulation Needs: no DME Used in Home: no Psychosocial Needs:  no Concerns/Needs Understanding Cancer:  addressed/answered by navigator to best of ability Self-Expressed Needs: no   Harlow Asa RN, BSN, OCN Head & Neck Oncology Nurse [facility] at [facility] Phone # [phone]  Fax # [phone]

## 2021-05-02 NOTE — Telephone Encounter (Signed)
Scheduled appt per 11/9 referral. Spoke to pt's wife who is aware of appt date and time.

## 2021-05-03 ENCOUNTER — Other Ambulatory Visit: Payer: Self-pay

## 2021-05-03 ENCOUNTER — Ambulatory Visit: Payer: Medicare Other | Admitting: Physical Therapy

## 2021-05-03 ENCOUNTER — Encounter: Payer: Self-pay | Admitting: Physical Therapy

## 2021-05-03 DIAGNOSIS — M6281 Muscle weakness (generalized): Secondary | ICD-10-CM | POA: Diagnosis not present

## 2021-05-03 DIAGNOSIS — R279 Unspecified lack of coordination: Secondary | ICD-10-CM

## 2021-05-03 DIAGNOSIS — R2689 Other abnormalities of gait and mobility: Secondary | ICD-10-CM

## 2021-05-03 NOTE — Therapy (Signed)
[facility] @ [address], [place], [address] Phone: [phone]   Fax:  [phone]  Physical Therapy Treatment  Patient Details  Name: [name] [name] MRN: [id number] Date of Birth: 06/24/1961 Referring Provider (PT): [name], MD   Encounter Date: 05/03/2021   PT End of Session - 05/03/21 1054     Visit Number 14    Date for PT Re-Evaluation 05/17/21    Authorization Type [employer]    Authorization - Visit Number 14    Authorization - Number of Visits 60    Progress Note Due on Visit 20    PT Start Time 1020    PT Stop Time 1100    PT Time Calculation (min) 40 min    Equipment Utilized During Treatment Gait belt    Activity Tolerance Patient tolerated treatment well    Behavior During Therapy [facility] for tasks assessed/performed             Past Medical History:  Diagnosis Date   Adenomatous colon polyp 10/1999   Anxiety    Arthritis    neck, yoga helps.   Cataract    Complex partial seizure ([place])    last seizure was 08-30-2018   Crohn's disease of small and large intestines ([place]) 1999   Depression    Diabetes mellitus    no meds at this time 10-24-17   Esophageal stricture    External hemorrhoids    GERD (gastroesophageal reflux disease)    Glaucoma    History of kidney stones    3 large stones still present   Hyperlipemia    Hypertension    resolved with weight loss    Inguinal hernia, bilateral 12/2019   Liver cirrhosis secondary to NASH ([place])    Migraines    Osteoporosis    PONV (postoperative nausea and vomiting)    PTSD (post-traumatic stress disorder)    Renal cyst, acquired, left 07/08/2017   Skin cancer    squamous cell multiple; [facility]; followed every 3 months.   Sleep apnea    CPAP machine, uses nightly   Staphylococcus aureus bacteremia with sepsis ([place]) 05/28/2017   MRSA 2012    Past Surgical History:  Procedure Laterality Date   CATARACT EXTRACTION Bilateral     COLONOSCOPY     DEBRIDEMENT MANDIBLE N/A 12/07/2020   Procedure: DEBRIDEMENT OF MANDIBLE;  Surgeon: [name], DMD;  Location: [facility];  Service: Oral Surgery;  Laterality: N/A;   ELBOW SURGERY  2012   elbow MRSA infection    EYE SURGERY     cataracts removed, /w IOL   GROSS TEETH DEBRIDEMENT N/A 12/29/2020   Procedure: MANDIBULAR  DEBRIDEMENT;  Surgeon: [name], DMD;  Location: [facility];  Service: Dentistry;  Laterality: N/A;   INCISION AND DRAINAGE ABSCESS N/A 12/07/2020   Procedure: INCISION AND DRAINAGE MANDIBULAR ABSCESS;  Surgeon: [name], DMD;  Location: [facility];  Service: Oral Surgery;  Laterality: N/A;   INGUINAL HERNIA REPAIR Right 01/14/2020   Procedure: OPEN RIGHT HERNIA REPAIR INGUINAL WITH MESH;  Surgeon: [name], MD;  Location: [facility];  Service: General;  Laterality: Right;   INSERTION OF MESH N/A 07/01/2016   Procedure: INSERTION OF MESH;  Surgeon: [name], MD;  Location: [facility];  Service: General;  Laterality: N/A;   IR URETERAL STENT LEFT NEW ACCESS W/O SEP NEPHROSTOMY CATH  02/02/2018   NEPHROLITHOTOMY Left 02/02/2018   Procedure: NEPHROLITHOTOMY PERCUTANEOUS;  Surgeon:  [name], MD;  Location: [facility];  Service: Urology;  Laterality: Left;   POLYPECTOMY     SCALP LACERATION REPAIR Right 10/16/2017   From fall/staples   SHOULDER ARTHROSCOPY Right 03/05/2018   Procedure: ARTHROSCOPY SHOULDER AND OPEN DISTAL CLAVICLE EXCISION;  Surgeon: [name], MD;  Location: [facility];  Service: Orthopedics;  Laterality: Right;   SHOULDER SURGERY     SINUS SURGERY WITH INSTATRAK     TEE WITHOUT CARDIOVERSION N/A 05/09/2017   Procedure: TRANSESOPHAGEAL ECHOCARDIOGRAM (TEE);  Surgeon: [name], MD;  [facility];  Service: Cardiovascular;  Laterality: N/A;   TOOTH EXTRACTION N/A 12/07/2020   Procedure: DENTAL EXTRACTIONS OF TEETH TWENTY TO THIRTY;  Surgeon: [name], DMD;  Location: [facility];  Service: Oral Surgery;  Laterality:  N/A;   TOOTH EXTRACTION N/A 12/29/2020   Procedure: DENTAL RESTORATION/EXTRACTIONS;  Surgeon: [name], DMD;  Location: [facility];  Service: Dentistry;  Laterality: N/A;   TRANSTHORACIC ECHOCARDIOGRAM  02/22/2011   EF 55-65%; increased pattern of LVH with mild conc hypertrophy, abnormal relaxation & increased filling pressure (grade 2 diastolic dysfunction); atrial septum thickened (lipomatous hypertrophy)   UMBILICAL HERNIA REPAIR N/A 07/01/2016   Procedure: UMBILICAL HERNIA REPAIR WITH MESH;  Surgeon: [name], MD;  Location: [facility];  Service: General;  Laterality: N/A;   UPPER GASTROINTESTINAL ENDOSCOPY     VASECTOMY      There were no vitals filed for this visit.   Subjective Assessment - 05/03/21 1023     Subjective My mind is off today.  I feel like I can't concentrate and I know my memory will be bad today.    Pertinent History on disability, has memory problems, liver cirrhosis, [place], osteoporosis with multiple fractures including C6, thrombocytopenia, gluacoma (loss of peripheral vision).  Has lost 75lb through walking but concerned about safety    Limitations Walking    How long can you walk comfortably? 2.5 miles with walking sticks    Diagnostic tests C6 fracture and ligamentum flavum tear - non-surgical    Patient Stated Goals work on coming down and up stairs, balance    Currently in Pain? No/denies                               [facility] Adult PT Treatment/Exercise - 05/03/21 0001       Neuro Re-ed    Neuro Re-ed Details  seated reciprocal coord march cross body taps opp knee x 20      Exercises   Exercises Lumbar;Knee/Hip      Lumbar Exercises: Aerobic   Nustep L7 x 5' PT present to monitor and discuss progress and plan for session      Lumbar Exercises: Seated   Other Seated Lumbar Exercises blue plyo ball hip to hip, hip to shoulder each way, ear to ear 1x10      Shoulder Exercises: Power [employer] 10 reps    Row Limitations 20lb  1x10 each square stance and stagger stance Rt/Lt                 Balance Exercises - 05/03/21 0001       Balance Exercises: Standing   Marching Solid surface;Upper extremity assist 2;20 reps   high knees   Heel Raises Both;10 reps    Other Standing Exercises in parallel bars: hurdle step over and back 1x10 Rt/Lt with weight shift into LE  Other Standing Exercises Comments multidirectional stepping 3-way x 5 each, box stepping x 5 each way                  PT Short Term Goals - 04/05/21 0926       PT SHORT TERM GOAL #1   Title Patient will complete 3 minute walk test using LRAD with only supervision to indicate decreased fall risk.    Status Achieved      PT SHORT TERM GOAL #2   Title Pt will verbalize and demo safe strategies for stair pattern (step to for descending) and gait patterns/pace (slow down) without AD    Status Achieved      PT SHORT TERM GOAL #3   Title PT and Pt will explore gait with walker and make decisions about appropriateness of use for improved safety.    Baseline using walking sticks, has had 1 fall since starting PT    Status Achieved      PT SHORT TERM GOAL #4   Title Pt ind with initial HEP    Status Achieved               PT Long Term Goals - 04/05/21 0927       PT LONG TERM GOAL #1   Title Improve 5x sit to stand to </= 14 sec.    Baseline 18 sec, no UE    Status On-going      PT LONG TERM GOAL #2   Title Pt will demo up/down stairs x 3 rounds (4 6 inch steps) using bil UEs and safe strategy to demo improved safety on stairs    Baseline step to pattern, no handrails, 1 LOB having to put hand on the wall    Status Partially Met      PT LONG TERM GOAL #3   Title Pt will demo safety/no LOB with community ambulation for 6' walk test, curb up/down and grassy surface with supervision using least restrictive AD or no AD.    Baseline 6' walk test met but LOB stepping off grassy curb    Status On-going      PT LONG TERM  GOAL #4   Title Pt will report at least 50% more confidence in balance with daily tasks    Baseline 40%    Status On-going      PT LONG TERM GOAL #5   Title Ind with advanced HEP and understand how to safely progress    Baseline 20/30% in the last couple of weeks    Status On-going                   Plan - 05/03/21 1055     Clinical Impression Statement Pt arrived with report of difficulty concentrating and with poorer memory and coordination today.  He needed more supervision and had more LOB today with gait/balance training than previous sessions.  He had difficutly coordinating reciprocal movement patterns and box stepping sequencing today.  LE fatigue was present on arrival today so more focus on gait today vs strength training.  Gait belt and min/mod A used for safety today.  PT spoke with Pt and his wife end of session today regarding upcoming radiation for Pt which may affect his schedule for PT.  They don't have schedule yet but know to keep PT posted.    Comorbidities seizures, memory deficits, osteoporosis with Hx of multiple fractures    PT Frequency 2x / week    PT  Duration 6 weeks    PT Treatment/Interventions ADLs/Self Care Home Management;Gait training;Stair training;Functional mobility training;Therapeutic activities;Therapeutic exercise;Balance training;Neuromuscular re-education;Manual techniques;Patient/family education;Passive range of motion    PT Next Visit Plan gait belt throughout session, balance, reciprocal UE/LE movement, functional strength    PT Home Exercise Plan [id number]    Recommended Other Services Pt has upcoming radiation, may affect his schedule and depending on side effects his participation    Consulted and Agree with Plan of Care Patient    Family Member Consulted Pt's wife             Patient will benefit from skilled therapeutic intervention in order to improve the following deficits and impairments:     Visit Diagnosis: Muscle  weakness (generalized)  Other abnormalities of gait and mobility  Unspecified lack of coordination     Problem List Patient Active Problem List   Diagnosis Date Noted   Liver cirrhosis secondary to NASH ([place])    GERD (gastroesophageal reflux disease)    Pancytopenia ([facility])    Osteomyelitis of jaw 12/29/2020   Acute osteomyelitis of mandible 12/26/2020   S/P inguinal hernia repair 01/14/2020   At high risk for falls    S/P shoulder surgery 03/05/2018   Excessive daytime sleepiness 02/18/2018   Crohn's disease of colon with rectal bleeding ([place]) 02/18/2018   Blood in stool 02/18/2018   Iron deficiency anemia due to chronic blood loss 02/18/2018   Iron deficiency anemia due to sideropenic dysphagia 02/18/2018   Renal calculus, left 02/02/2018   History of sepsis 12/16/2017   Renal cyst, acquired, left 07/08/2017   Frequent falls 07/08/2017   Renal lesion 05/28/2017   MSSA bacteremia 05/06/2017   Confusion 04/02/2017   Syncope 08/30/2016   Depression 04/30/2016   Memory loss 04/30/2016   OSA on CPAP 10/31/2015   Nonepileptic episode ([place]) 04/25/2015   Seizure ([place]) 04/04/2013   Abnormal EKG 03/12/2013   DOE (dyspnea on exertion) 03/12/2013   Complex partial seizure ([place]) 04/19/2012   DM2 (diabetes mellitus, type 2) ([place]) 09/25/2011   OTHER DYSPHAGIA 03/27/2009   Cough 11/10/2008   GERD 12/07/2007   COLONIC POLYPS, ADENOMATOUS, HX OF 12/07/2007   EXTERNAL HEMORRHOIDS 09/11/2007   CROHN'S DISEASE, LARGE AND SMALL INTESTINES 09/11/2007   OSTEOPOROSIS 09/11/2007   HYPERLIPIDEMIA NEC/NOS 02/27/2007   Essential hypertension 02/27/2007    Baruch Merl, PT 05/03/21 1:37 PM   [facility] @ [address] [place], [place], [address] Phone: [phone]   Fax:  [phone]  Name: PASCUAL MANTEL MRN: [id number] Date of Birth: [birth year]

## 2021-05-04 DIAGNOSIS — H04123 Dry eye syndrome of bilateral lacrimal glands: Secondary | ICD-10-CM | POA: Diagnosis not present

## 2021-05-04 DIAGNOSIS — H532 Diplopia: Secondary | ICD-10-CM | POA: Diagnosis not present

## 2021-05-07 NOTE — Progress Notes (Signed)
Radiation Oncology         [phone] ________________________________  Initial Outpatient Consultation  Name: [name] [name] MRN: [id number]  Date: 05/08/2021  DOB: 1960/07/19  CC:[name], MD  [name], MD   REFERRING PHYSICIAN: [name], MD  DIAGNOSIS:    ICD-10-CM   1. SCC (squamous cell carcinoma), face  C44.320 Ambulatory referral to Social Work    2. 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  C44.329       Recurrent right malar cheek squamous cell carcinoma, keratinizing, with extensive PNI (intraorbital nerve)   Cancer Staging  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 Staging form: Cutaneous Carcinoma of the Head and Neck, AJCC 8th Edition - Pathologic stage from 05/08/2021: Stage III (rpT3, pN0, cM0) - Signed by [name], MD on 05/11/2021 Stage prefix: Recurrence Extraosseous extension: Absent  CHIEF COMPLAINT: Here to discuss management of skin cancer  HISTORY OF PRESENT ILLNESS::[name] [name] is a 60 y.o. male with an extensive skin cancer history, who presented with a new lesion on his right medial malar cheek, which he first noticed this past April/May, to his dermatologist. Biopsy of the lesion (on unknown date) performed by his dermatologist revealed squamous cell carcinoma.   Subsequently, the patient was referred to Dr. Sarajane Jews for Mohs excision on 12/19/20 which revealed negative margins.   Of note: Prior to Mohs treatment for the [name] on his R malar cheek, the patient was hospitalized for osteomyelitis (Staph) of his mandible requiring extraction of all of his bottom teeth. He was hospitalized from 12/04/20-12/11/20 for IV antibiotic therapy. The patient will maintain follow-up with oral surgery in regards to this.  This past August, the patient noticed a new hard nodular growth at the site of his previous SCC of the right malar cheek. The patient described the growth as the size of a marble. Skin punch biopsy on 02/21/21 of  the right mid central malar site revealed inflamed seborrheic keratosis.  Subsequently, the patient underwent biopsy of the site on 03/15/21 which revealed recurrent SCC, with positive margins of moderately differentiated SCC.   The patient was then referred to Dr. Charolett Bumpers on 04/10/21 for further evaluation of the lesion. The patient denied any drainage from the site of SCC, but did report posterior nasal drainage. (The patient has history of [name], and has had difficulty swallowing both solids and liquids). Patient also reported decreased vision in his right eye and blurry vision. Physical exam performed revealed a 2 cm scar which is tender to palpation to his right malar cheek. Patient also reported a new "pimple-like" lesion just below his right eye. The pimple-like lesion was noted as concerning due to the fact that the patient's cheek lesion first started out this way.   Imaging performed during visit with Dr. Charolett Bumpers on 04/10/21 is as follows:  --Sinus CT revealed an ill-defined soft tissue density over the right maxilla, noted to likely correlate with patient's known lesion. Preserved premaxillary fat pad was also appreciated. Perineural spread could not be entirely excluded; further workup with MRI will likely be needed. --Chest CT revealed no evidence of intrathoracic metastases. Incidental findings included multiple bilateral renal calculi, and a dilated right renal collecting system. --CT of the head revealed no evidence of intracranial abnormality.  The patient was then admitted on 04/10/21 to undergo partial maxillectomy with orbital exenteration. Pathology from the procedure revealed:  --Facial resection with nerve: moderately differentiated invasive keratinizing squamous cell carcinoma with extensive PNI; tumor  size of 2.3 cm, with tumor associated lymphocytes present, tumor noted to be centered in the dermis and subcutaneous tissue, but not involving the overlying epidermis. Nerve margins  positive for carcinoma. --Intraorbital nerve and intraorbital nerve margin biopsy: positive for carcinoma --Biopsies of the orbital canal bone, intraorbital foramen, inferior margin, medial margin, lateral margin, orbital margin, and 4/4 cutaneous skin margins were all negative for malignancy.  Unfortunately, the patient suffered a seizure-like event during extubation with increasing hypercarbia with worsening acidosis and required intubation. Neurology was consulted and stated that the patient's episode was not likely a seizure. The patient was stable for the remainder of his hospital course and successfully extubated on 04/11/21.  Of note: The patient has a extensive skin cancer history in the setting of immunosuppression from [name].   Has some blurry vision post operatively.  I have personally reviewed his imaging.  The patient is with his significant other and acknowledges some difficulty with memory.  PREVIOUS RADIATION THERAPY: No  PAST MEDICAL HISTORY:  has a past medical history of Adenomatous colon polyp (10/1999), Anxiety, Arthritis, Cataract, Complex partial seizure ([name]), Crohn's disease of small and large intestines ([name]) (1999), Depression, Diabetes mellitus, Esophageal stricture, External hemorrhoids, GERD (gastroesophageal reflux disease), Glaucoma, History of kidney stones, Hyperlipemia, Hypertension, Inguinal hernia, bilateral (12/2019), Liver cirrhosis secondary to NASH ([name]), Migraines, Osteoporosis, PONV (postoperative nausea and vomiting), PTSD (post-traumatic stress disorder), Renal cyst, acquired, left (07/08/2017), Skin cancer, Sleep apnea, and Staphylococcus aureus bacteremia with sepsis ([name]) (05/28/2017).    PAST SURGICAL HISTORY: Past Surgical History:  Procedure Laterality Date   CATARACT EXTRACTION Bilateral    COLONOSCOPY     DEBRIDEMENT MANDIBLE N/A 12/07/2020   Procedure: DEBRIDEMENT OF MANDIBLE;  Surgeon: [name], DMD;  Location: [place];   Service: Oral Surgery;  Laterality: N/A;   ELBOW SURGERY  2012   elbow MRSA infection    EYE SURGERY     cataracts removed, /w IOL   GROSS TEETH DEBRIDEMENT N/A 12/29/2020   Procedure: MANDIBULAR  DEBRIDEMENT;  Surgeon: [name], DMD;  Location: [place];  Service: Dentistry;  Laterality: N/A;   INCISION AND DRAINAGE ABSCESS N/A 12/07/2020   Procedure: INCISION AND DRAINAGE MANDIBULAR ABSCESS;  Surgeon: [name], DMD;  Location: [place];  Service: Oral Surgery;  Laterality: N/A;   INGUINAL HERNIA REPAIR Right 01/14/2020   Procedure: OPEN RIGHT HERNIA REPAIR INGUINAL WITH MESH;  Surgeon: [name], MD;  Location: [facility];  Service: General;  Laterality: Right;   INSERTION OF MESH N/A 07/01/2016   Procedure: INSERTION OF MESH;  Surgeon: [name], MD;  Location: [place];  Service: General;  Laterality: N/A;   IR URETERAL STENT LEFT NEW ACCESS W/O SEP NEPHROSTOMY CATH  02/02/2018   NEPHROLITHOTOMY Left 02/02/2018   Procedure: NEPHROLITHOTOMY PERCUTANEOUS;  Surgeon: [name], MD;  Location: [facility];  Service: Urology;  Laterality: Left;   POLYPECTOMY     SCALP LACERATION REPAIR Right 10/16/2017   From fall/staples   SHOULDER ARTHROSCOPY Right 03/05/2018   Procedure: ARTHROSCOPY SHOULDER AND OPEN DISTAL CLAVICLE EXCISION;  Surgeon: [name], MD;  Location: [place];  Service: Orthopedics;  Laterality: Right;   SHOULDER SURGERY     SINUS SURGERY WITH INSTATRAK     TEE WITHOUT CARDIOVERSION N/A 05/09/2017   Procedure: TRANSESOPHAGEAL ECHOCARDIOGRAM (TEE);  Surgeon: [name], MD;  Location: [facility];  Service: Cardiovascular;  Laterality: N/A;   TOOTH EXTRACTION N/A 12/07/2020   Procedure: DENTAL EXTRACTIONS OF TEETH  TWENTY TO THIRTY;  Surgeon: [name], DMD;  Location: [place];  Service: Oral Surgery;  Laterality: N/A;   TOOTH EXTRACTION N/A 12/29/2020   Procedure: DENTAL RESTORATION/EXTRACTIONS;  Surgeon: [name], DMD;  Location: [place];   Service: Dentistry;  Laterality: N/A;   TRANSTHORACIC ECHOCARDIOGRAM  02/22/2011   EF 55-65%; increased pattern of LVH with mild conc hypertrophy, abnormal relaxation & increased filling pressure (grade 2 diastolic dysfunction); atrial septum thickened (lipomatous hypertrophy)   UMBILICAL HERNIA REPAIR N/A 07/01/2016   Procedure: UMBILICAL HERNIA REPAIR WITH MESH;  Surgeon: [name], MD;  Location: [place];  Service: General;  Laterality: N/A;   UPPER GASTROINTESTINAL ENDOSCOPY     VASECTOMY      FAMILY HISTORY: family history includes Colon polyps in his father; Heart disease in his mother; Hyperlipidemia in his father and mother; Hypertension in his father and mother; Other in his child; Stroke in his father, paternal grandfather, and paternal grandmother.  SOCIAL HISTORY:  reports that he has never smoked. He has never been exposed to tobacco smoke. He has never used smokeless tobacco. He reports that he does not drink alcohol and does not use drugs.  ALLERGIES: Azithromycin, Beef-derived products, Milk-related compounds, Penicillins, Sulfonamide derivatives, and Dilaudid [hydromorphone hcl]  MEDICATIONS:  Current Outpatient Medications  Medication Sig Dispense Refill   Calcium Carbonate-Vitamin D (CALCIUM 600+D PO) Take 1 tablet by mouth 2 (two) times daily.     cholecalciferol (VITAMIN D) 1000 units tablet Take 1,000 Units at bedtime by mouth.      esomeprazole (NEXIUM) 40 MG capsule Take 1 capsule (40 mg total) by mouth 2 (two) times daily before a meal. 180 capsule 3   Eszopiclone 3 MG TABS Take 3 mg by mouth at bedtime. Take immediately before bedtime     folic acid (FOLVITE) 1 MG tablet Take 2 tablets (2 mg total) by mouth daily. 180 tablet 3   imiquimod (ALDARA) 5 % cream Apply 1 packet topically 2 (two) times a week.     lamoTRIgine (LAMICTAL) 150 MG tablet Take 1 tablet (150 mg total) by mouth 2 (two) times daily. 180 tablet 3   latanoprost (XALATAN) 0.005 % ophthalmic solution  Place 1 drop into both eyes at bedtime.      mercaptopurine (PURINETHOL) 50 MG tablet TAKE 2 TABLETS DAILY ON AN               EMPTY STOMACH 1 HOUR BEFORE              OR 2 HOURS AFTER A MEAL.                 CAUTION: CHEMOTHERAPY. NEED APPOINTMENT. 180 tablet 3   mesalamine (LIALDA) 1.2 g EC tablet TAKE 2 TABLETS DAILY WITH  BREAKFAST 180 tablet 3   methscopolamine (PAMINE FORTE) 5 MG tablet Take 1 tablet (5 mg total) by mouth 2 (two) times daily. 180 tablet 3   Multiple Vitamin (MULTIVITAMIN WITH MINERALS) TABS tablet Take 1 tablet by mouth daily. 30 tablet 0   potassium chloride SA (KLOR-CON) 20 MEQ tablet Take 1 tablet (20 mEq total) by mouth 2 (two) times daily. 180 tablet 3   Probiotic Product (PROBIOTIC PO) Take 1 tablet at bedtime by mouth.      rifaximin (XIFAXAN) 550 MG TABS tablet Take 1 tablet (550 mg total) by mouth 2 (two) times daily. 180 tablet 3   sertraline (ZOLOFT) 100 MG tablet Take 200 mg daily with breakfast by mouth.  tolterodine (DETROL LA) 4 MG 24 hr capsule Take 4 mg by mouth daily.     traZODone (DESYREL) 150 MG tablet Take 150 mg by mouth at bedtime.     amoxicillin-clavulanate (AUGMENTIN) 875-125 MG tablet Take 1 tablet by mouth 2 (two) times daily. 28 tablet 0   chlorhexidine (PERIDEX) 0.12 % solution Use as directed 15 mLs in the mouth or throat in the morning, at noon, and at bedtime. 120 mL 1   feeding supplement (ENSURE ENLIVE / ENSURE PLUS) LIQD Take 237 mLs by mouth 2 (two) times daily between meals. 237 mL 12   glucose blood (CHOICE DM FORA G20 TEST STRIPS) test strip Use as instructed 100 each 12   Lactase (DAIRY RELIEF PO) Take 2 tablets by mouth in the morning and at bedtime.     ONE TOUCH ULTRA TEST test strip USE AS INSTRUCTED 100 each 2   No current facility-administered medications for this encounter.   Facility-Administered Medications Ordered in Other Encounters  Medication Dose Route Frequency Provider Last Rate Last Admin   gadopentetate  dimeglumine (MAGNEVIST) injection 20 mL  20 mL Intravenous Once PRN Marcial Pacas, MD        REVIEW OF SYSTEMS:  Notable for that above.   PHYSICAL EXAM:  height is 5' 11"  (1.803 m) and weight is 177 lb 8 oz (80.5 kg). His temporal temperature is 97.2 F (36.2 C) (abnormal). His blood pressure is 146/80 (abnormal) and his pulse is 72. His respiration is 18 and oxygen saturation is 99%.   General: Alert and oriented, in no acute distress with some signs of memory difficulties HEENT: Head is normocephalic. Extraocular movements are intact.  Decreased visual acuity in right eye Neck: Neck is supple, no palpable cervical or supraclavicular lymphadenopathy.  No palpable periauricular or facial adenopathy either Heart: Regular in rate and rhythm  Chest: Clear to auscultation bilaterally Abdomen: Soft, nontender, nondistended, with no rigidity or guarding. Lymphatics: see Neck Exam Skin: Satisfactory healing of scar over right cheek/periorbital region Psychiatric: Judgment and insight are intact. Affect is appropriate.  ECOG = 1  0 - Asymptomatic (Fully active, able to carry on all predisease activities without restriction)  1 - Symptomatic but completely ambulatory (Restricted in physically strenuous activity but ambulatory and able to carry out work of a light or sedentary nature. For example, light housework, office work)  2 - Symptomatic, <50% in bed during the day (Ambulatory and capable of all self care but unable to carry out any work activities. Up and about more than 50% of waking hours)  3 - Symptomatic, >50% in bed, but not bedbound (Capable of only limited self-care, confined to bed or chair 50% or more of waking hours)  4 - Bedbound (Completely disabled. Cannot carry on any self-care. Totally confined to bed or chair)  5 - Death   Eustace Pen MM, Creech RH, Tormey DC, et al. [phone]). "Toxicity and response criteria of the [facility]". [place] Oncol. 5 (6):  649-55   LABORATORY DATA:  Lab Results  Component Value Date   WBC 2.8 (L) 01/14/2021   HGB 10.7 (L) 01/14/2021   HCT 31.4 (L) 01/14/2021   MCV 107.2 (H) 01/14/2021   PLT PLATELET CLUMPS NOTED ON SMEAR, UNABLE TO ESTIMATE 01/14/2021   CMP     Component Value Date/Time   NA 142 04/03/2021 0917   NA 142 01/11/2020 0924   K 4.0 04/03/2021 0917   CL 109 04/03/2021 0917   CO2  24 04/03/2021 0917   GLUCOSE 99 04/03/2021 0917   BUN 9 04/03/2021 0917   BUN 10 01/11/2020 0924   CREATININE 0.76 04/03/2021 0917   CREATININE 0.78 12/14/2020 1407   CREATININE 0.84 05/15/2016 1005   CALCIUM 9.3 04/03/2021 0917   PROT 6.1 (L) 01/14/2021 2100   PROT 5.9 (L) 01/11/2020 0924   ALBUMIN 3.6 01/14/2021 2100   ALBUMIN 4.2 01/11/2020 0924   AST 42 (H) 01/14/2021 2100   AST 52 (H) 12/14/2020 1407   ALT 34 01/14/2021 2100   ALT 57 (H) 12/14/2020 1407   ALKPHOS 124 01/14/2021 2100   BILITOT 1.4 (H) 01/14/2021 2100   BILITOT 1.2 12/14/2020 1407   GFRNONAA >60 01/14/2021 2100   GFRNONAA >60 12/14/2020 1407   GFRNONAA >89 06/03/2015 0858   GFRAA >60 01/15/2020 0609   GFRAA >89 06/03/2015 0858     RADIOGRAPHY: As above    IMPRESSION/PLAN:Today, I talked to the patient about the findings and work-up thus far.  We discussed the patient's diagnosis of locally advanced skin cancer involving the infraorbital nerve, with positive margins following surgical resection.  We discussed general treatment for this, highlighting the role of adjuvant radiotherapy in the management.  We discussed the available radiation techniques, and focused on the details of logistics and delivery.    Radiation therapy (IMRT) would take place over approximately 6 and half weeks and I would target the tumor bed over his right face as well as the infraorbital nerve.  I have personally discussed this case and his imaging with our radiologist to ensure that the target properly contoured along the infraorbital nerve pathway.   Patient was presented today at our ENT tumor board.  We discussed the risks, benefits, and side effects of radiotherapy. Side effects may include but not necessarily be limited to: Skin irritation, fatigue, local hair loss, irritation to the eye, sinus congestion and irritation, taste changes, possible serious injury to the tissues of the head and neck region including the eye. No guarantees of treatment were given. A consent form was signed and placed in the patient's medical record. The patient was encouraged to ask questions that I answered to the best of my ability.  He is enthusiastic to proceed.  We will schedule him for treatment planning in the near future.   On date of service, in total, I spent 60 minutes on this encounter. Patient was seen in person.   __________________________________________   [name], MD  This document serves as a record of services personally performed by [name], MD. It was created on her behalf by Roney Mans, a trained medical scribe. The creation of this record is based on the scribe's personal observations and the provider's statements to them. This document has been checked and approved by the attending provider.

## 2021-05-08 ENCOUNTER — Encounter: Payer: Medicare Other | Admitting: Physical Therapy

## 2021-05-08 ENCOUNTER — Encounter: Payer: Self-pay | Admitting: Radiation Oncology

## 2021-05-08 ENCOUNTER — Other Ambulatory Visit: Payer: Self-pay

## 2021-05-08 ENCOUNTER — Telehealth: Payer: Self-pay | Admitting: Hematology and Oncology

## 2021-05-08 ENCOUNTER — Ambulatory Visit
Admission: RE | Admit: 2021-05-08 | Discharge: 2021-05-08 | Disposition: A | Discharge status: Home / Self Care | Payer: Medicare Other | Source: Ambulatory Visit | Attending: Radiation Oncology | Admitting: Radiation Oncology

## 2021-05-08 VITALS — BP 146/80 | HR 72 | Temp 97.2°F | Resp 18 | Ht 71.0 in | Wt 177.5 lb

## 2021-05-08 DIAGNOSIS — Z79899 Other long term (current) drug therapy: Secondary | ICD-10-CM | POA: Diagnosis not present

## 2021-05-08 DIAGNOSIS — K5080 Crohn's disease of both small and large intestine without complications: Secondary | ICD-10-CM | POA: Diagnosis not present

## 2021-05-08 DIAGNOSIS — G4730 Sleep apnea, unspecified: Secondary | ICD-10-CM | POA: Insufficient documentation

## 2021-05-08 DIAGNOSIS — C44329 Squamous cell carcinoma of skin of other parts of face: Secondary | ICD-10-CM

## 2021-05-08 DIAGNOSIS — N200 Calculus of kidney: Secondary | ICD-10-CM | POA: Insufficient documentation

## 2021-05-08 DIAGNOSIS — M810 Age-related osteoporosis without current pathological fracture: Secondary | ICD-10-CM | POA: Diagnosis not present

## 2021-05-08 DIAGNOSIS — E785 Hyperlipidemia, unspecified: Secondary | ICD-10-CM | POA: Insufficient documentation

## 2021-05-08 DIAGNOSIS — C44320 Squamous cell carcinoma of skin of unspecified parts of face: Secondary | ICD-10-CM

## 2021-05-08 DIAGNOSIS — C4402 Squamous cell carcinoma of skin of lip: Secondary | ICD-10-CM | POA: Diagnosis not present

## 2021-05-08 DIAGNOSIS — F331 Major depressive disorder, recurrent, moderate: Secondary | ICD-10-CM | POA: Diagnosis not present

## 2021-05-08 DIAGNOSIS — Z85828 Personal history of other malignant neoplasm of skin: Secondary | ICD-10-CM | POA: Diagnosis not present

## 2021-05-08 DIAGNOSIS — E119 Type 2 diabetes mellitus without complications: Secondary | ICD-10-CM | POA: Diagnosis not present

## 2021-05-08 DIAGNOSIS — K219 Gastro-esophageal reflux disease without esophagitis: Secondary | ICD-10-CM | POA: Insufficient documentation

## 2021-05-08 DIAGNOSIS — L820 Inflamed seborrheic keratosis: Secondary | ICD-10-CM | POA: Insufficient documentation

## 2021-05-08 DIAGNOSIS — I10 Essential (primary) hypertension: Secondary | ICD-10-CM | POA: Insufficient documentation

## 2021-05-08 DIAGNOSIS — C4492 Squamous cell carcinoma of skin, unspecified: Secondary | ICD-10-CM

## 2021-05-08 NOTE — Progress Notes (Addendum)
Head and Neck Cancer Location of Tumor / Histology:  Squamous cell carcinoma of the face  Patient presented with symptoms of: (from Dr. Sara Chu H&P)   Biopsies revealed:  04/10/2021 Diagnosis   A: Skin, cutaneous margin 6:00 to 3:00, biopsy - Benign skin - Negative for malignancy B: Skin, cutaneous margin 12:00 to 3:00, biopsy - Benign skin - Negative for malignancy C: Skin, cutaneous margin 6:00 to 9:00, biopsy - Benign skin - Negative for malignancy D: Skin, cutaneous margin, 12:00 to 9:00, biopsy - Benign skin and skeletal muscle - Negative for malignancy E: Orbital margin, biopsy - Benign fibroadipose tissue - Negative for malignancy F: Lateral margin, biopsy - Benign fibroadipose tissue - Negative for malignancy G: Medial margin, biopsy - Benign skeletal muscle  - Negative for malignancy H: Inferior margin, biopsy - Benign skeletal muscle, nerve, and adipose tissue - Negative for malignancy I: Nerve, intraorbital margin, biopsy - Positive for invasive squamous cell carcinoma, involving soft tissue and focal perineural invasion J: Nerve, intraorbital margin #2, biopsy - Benign nerve and fibrovascular tissue - Negative for malignancy K: Orbital canal bone, biopsy - Fragment of bone - Negative for malignancy  L: Facial resection with nerve - Invasive squamous cell carcinoma, moderately differentiated, keratinizing - Tumor size 2.3 cm, centered in the dermis and subcutaneous tissue, but not involving the overlying epidermis, consistent with clinically recurrent disease - Tumor associated lymphocytes present - Extensive perineural invasion - Nerve margin positive for carcinoma in this specimen - All other margins negative (closest negative margin is superficial soft tissue and skeletal muscle, which is within 1 mm) - Pathologic stage (r)pT3 M: Intraorbital foramen, biopsy - Viable bone - Negative for malignancy N: Intraorbital nerve, biopsy - Positive for  carcinoma, present in the soft tissue adjacent to nerve in the en face (clipped) margin  Nutrition Status Yes No Comments  Weight changes? []  [x]    Swallowing concerns? [x]  []  Currently only able to tolerate soft/pureed foods due to lack of teeth on lower jaw; When he was eating solid foods, he had trouble swallowing sometimes due to [name] condition  PEG? []  [x]     Referrals Yes No Comments  Social Work? [x]  []    Dentistry? [x]  []  Saw Dr. Anselm Jungling at [facility] (plans to return after radiation to discuss getting fitted for lower dentures)  Swallowing therapy? [x]  []    Nutrition? [x]  []    Med/Onc? [x]  []     Safety Issues Yes No Comments  Prior radiation? []  [x]    Pacemaker/ICD? []  [x]    Possible current pregnancy? []  [x]  N/A  Is the patient on methotrexate? []  [x]     Tobacco/Marijuana/Snuff/ETOH use: Patient has never smoked or used smokeless tobacco. Denies any alcohol consumption or recreational drug use  Past/Anticipated interventions by otolaryngology, if any:  04/10/2021 --Dr. Vallarie Mare ([facility]--ENT) Right partial maxillectomy with removal of a right malar and maxillary lesion (CPT 31225) Right orbitotomy (CPT [phone]) Resection of the right infraorbital nerve (CPT [phone]) --Dr. Darene Lamer ([facility]--Plastic Surgeon) Adjacent tissue rearrangement, eyelid (2x5cm). Adjacent tissue rearrangement, island pedicle advancement flap, cheek (5x5cm).  Past/Anticipated interventions by medical oncology, if any:  Scheduled for consultation with Dr. Narda Rutherford on 05/18/2021  Current Complaints / other details:   Saw Dr. Toma Deiters, DDS/Dr. Sandi Mariscal, DDS ([facility]-OMFS) 04/17/2021 --Assessment:  [name] [name] is a 60 y.o. male with a history of complex partial seizures, Crohn's, depression, type 2 diabetes, squamous cell carcinoma of right face (status post partial maxillectomy, pending radiation) presents to clinic for evaluation  and treatment of osteomyelitis of the mandible.  His clinical  presentation and history is consistent with osteomyelitis of the mandible. He has no acute need for surgical intervention at this time.  FU in 3 months recommended. The case was discussed with Dr. Lynnell Jude. --Plan: FU 34m

## 2021-05-08 NOTE — Progress Notes (Signed)
Oncology Nurse Navigator Documentation   Met with patient during initial consult with Dr. Isidore Moos. He was accompanied by his wife, [name]. Further introduced myself as his/their Navigator, explained my role as a member of the Care Team. Assisted with post-consult appt scheduling. I have made him aware that he is scheduled for his Radiation planning session on 11/18 at 8:30. I toured him to the [facility] treatment area, explained procedures for lobby registration, arrival to Radiation Waiting, and arrival to treatment area.  He voiced understanding.   They verbalized understanding of information provided. I encouraged them to call with questions/concerns moving forward.  Harlow Asa, RN, BSN, OCN Head & Neck Oncology Nurse [place] [facility] [phone]

## 2021-05-08 NOTE — Telephone Encounter (Signed)
Called, not able to leave msg. Will call again

## 2021-05-09 ENCOUNTER — Ambulatory Visit: Payer: Medicare Other | Admitting: Physical Therapy

## 2021-05-09 ENCOUNTER — Encounter: Payer: Self-pay | Admitting: *Deleted

## 2021-05-09 ENCOUNTER — Encounter: Payer: Self-pay | Admitting: Physical Therapy

## 2021-05-09 ENCOUNTER — Encounter: Payer: Self-pay | Admitting: General Practice

## 2021-05-09 DIAGNOSIS — M6281 Muscle weakness (generalized): Secondary | ICD-10-CM | POA: Diagnosis not present

## 2021-05-09 DIAGNOSIS — R296 Repeated falls: Secondary | ICD-10-CM

## 2021-05-09 DIAGNOSIS — R279 Unspecified lack of coordination: Secondary | ICD-10-CM

## 2021-05-09 DIAGNOSIS — R2689 Other abnormalities of gait and mobility: Secondary | ICD-10-CM

## 2021-05-09 NOTE — Therapy (Signed)
[facility] @ [facility] [place], [place], [address] Phone: [phone]   Fax:  [phone]  Physical Therapy Treatment  Patient Details  Name: [name] [name] MRN: [id number] Date of Birth: 1961-06-17 Referring Provider (PT): [name], MD   Encounter Date: 05/09/2021   PT End of Session - 05/09/21 1322     Visit Number 15    Date for PT Re-Evaluation 05/17/21    Authorization Type [employer] Medicare    Authorization - Visit Number 15    Authorization - Number of Visits 60    Progress Note Due on Visit 20    PT Start Time 0930    PT Stop Time 1015    PT Time Calculation (min) 45 min    Equipment Utilized During Treatment Gait belt    Activity Tolerance Patient tolerated treatment well    Behavior During Therapy [facility] for tasks assessed/performed             Past Medical History:  Diagnosis Date   Adenomatous colon polyp 10/1999   Anxiety    Arthritis    neck, yoga helps.   Cataract    Complex partial seizure ([place])    last seizure was 08-30-2018   Crohn's disease of small and large intestines ([place]) 1999   Depression    Diabetes mellitus    no meds at this time 10-24-17   Esophageal stricture    External hemorrhoids    GERD (gastroesophageal reflux disease)    Glaucoma    History of kidney stones    3 large stones still present   Hyperlipemia    Hypertension    resolved with weight loss    Inguinal hernia, bilateral 12/2019   Liver cirrhosis secondary to NASH ([place])    Migraines    Osteoporosis    PONV (postoperative nausea and vomiting)    PTSD (post-traumatic stress disorder)    Renal cyst, acquired, left 07/08/2017   Skin cancer    squamous cell multiple; [facility]; followed every 3 months.   Sleep apnea    CPAP machine, uses nightly   Staphylococcus aureus bacteremia with sepsis ([place]) 05/28/2017   MRSA 2012    Past Surgical History:  Procedure Laterality Date   CATARACT EXTRACTION Bilateral     COLONOSCOPY     DEBRIDEMENT MANDIBLE N/A 12/07/2020   Procedure: DEBRIDEMENT OF MANDIBLE;  Surgeon: [name], DMD;  Location: [facility];  Service: Oral Surgery;  Laterality: N/A;   ELBOW SURGERY  2012   elbow MRSA infection    EYE SURGERY     cataracts removed, /w IOL   GROSS TEETH DEBRIDEMENT N/A 12/29/2020   Procedure: MANDIBULAR  DEBRIDEMENT;  Surgeon: [name], DMD;  Location: [facility];  Service: Dentistry;  Laterality: N/A;   INCISION AND DRAINAGE ABSCESS N/A 12/07/2020   Procedure: INCISION AND DRAINAGE MANDIBULAR ABSCESS;  Surgeon: [name], DMD;  Location: [facility];  Service: Oral Surgery;  Laterality: N/A;   INGUINAL HERNIA REPAIR Right 01/14/2020   Procedure: OPEN RIGHT HERNIA REPAIR INGUINAL WITH MESH;  Surgeon: [name], MD;  Location: [facility];  Service: General;  Laterality: Right;   INSERTION OF MESH N/A 07/01/2016   Procedure: INSERTION OF MESH;  Surgeon: [name], MD;  Location: [facility];  Service: General;  Laterality: N/A;   IR URETERAL STENT LEFT NEW ACCESS W/O SEP NEPHROSTOMY CATH  02/02/2018   NEPHROLITHOTOMY Left 02/02/2018   Procedure: NEPHROLITHOTOMY PERCUTANEOUS;  Surgeon:  [name], MD;  Location: [facility];  Service: Urology;  Laterality: Left;   POLYPECTOMY     SCALP LACERATION REPAIR Right 10/16/2017   From fall/staples   SHOULDER ARTHROSCOPY Right 03/05/2018   Procedure: ARTHROSCOPY SHOULDER AND OPEN DISTAL CLAVICLE EXCISION;  Surgeon: [name], MD;  Location: [facility];  Service: Orthopedics;  Laterality: Right;   SHOULDER SURGERY     SINUS SURGERY WITH INSTATRAK     TEE WITHOUT CARDIOVERSION N/A 05/09/2017   Procedure: TRANSESOPHAGEAL ECHOCARDIOGRAM (TEE);  Surgeon: [name], MD;  Location: [facility];  Service: Cardiovascular;  Laterality: N/A;   TOOTH EXTRACTION N/A 12/07/2020   Procedure: DENTAL EXTRACTIONS OF TEETH TWENTY TO THIRTY;  Surgeon: [name], DMD;  Location: [facility];  Service: Oral Surgery;  Laterality:  N/A;   TOOTH EXTRACTION N/A 12/29/2020   Procedure: DENTAL RESTORATION/EXTRACTIONS;  Surgeon: [name], DMD;  Location: [facility];  Service: Dentistry;  Laterality: N/A;   TRANSTHORACIC ECHOCARDIOGRAM  02/22/2011   EF 55-65%; increased pattern of LVH with mild conc hypertrophy, abnormal relaxation & increased filling pressure (grade 2 diastolic dysfunction); atrial septum thickened (lipomatous hypertrophy)   UMBILICAL HERNIA REPAIR N/A 07/01/2016   Procedure: UMBILICAL HERNIA REPAIR WITH MESH;  Surgeon: [name], MD;  Location: [facility];  Service: General;  Laterality: N/A;   UPPER GASTROINTESTINAL ENDOSCOPY     VASECTOMY      There were no vitals filed for this visit.   Subjective Assessment - 05/09/21 0937     Subjective I had another skin surgery on my face yesterday and am having some pain.  I am not to lift anything this week.    Patient is accompained by: Family member    Pertinent History on disability, has memory problems, liver cirrhosis, Ch[place], osteoporosis with multiple fractures including C6, thrombocytopenia, gluacoma (loss of peripheral vision).  Has lost 75lb through walking but concerned about safety    Limitations Walking    How long can you walk comfortably? 2.5 miles with walking sticks    Diagnostic tests C6 fracture and ligamentum flavum tear - non-surgical    Patient Stated Goals work on coming down and up stairs, balance    Currently in Pain? No/denies   unrelated facial pain from surgery               [facility] PT Assessment - 05/09/21 0001       6 minute walk test results    Endurance additional comments 6 min walk test outdoors with community challenges: curbs, hill incline and decline, using bil walking sticks, no LOB, good control using side approach for curb and good control down hill      Standardized Balance Assessment   Standardized Balance Assessment Five Times Sit to Stand    Five times sit to stand comments  3 trials: 13 sec, 11 sec, 9 sec                            [facility] Adult PT Treatment/Exercise - 05/09/21 0001       Ambulation/Gait   Stairs Yes    Stairs Assistance 5: Supervision;7: Independent    Stair Management Technique One rail Right;Alternating pattern    Number of Stairs 4   3 rounds up/down x 4 stairs   Height of Stairs 6    Curb 5: Supervision   up/down using side step down and up, taking advantage of Rt stronger side for Lt  step down, Rt step up, x 10 reps     Exercises   Exercises Lumbar;Knee/Hip      Lumbar Exercises: Aerobic   Nustep L5 arms and legs x 3', legs only 2'      Knee/Hip Exercises: Seated   Sit to Sand 3 sets;5 reps;without UE support   3 trials of 5x sit to stand                Balance Exercises - 05/09/21 0001       Balance Exercises: Standing   SLS with Vectors 5 reps                  PT Short Term Goals - 04/05/21 0926       PT SHORT TERM GOAL #1   Title Patient will complete 3 minute walk test using LRAD with only supervision to indicate decreased fall risk.    Status Achieved      PT SHORT TERM GOAL #2   Title Pt will verbalize and demo safe strategies for stair pattern (step to for descending) and gait patterns/pace (slow down) without AD    Status Achieved      PT SHORT TERM GOAL #3   Title PT and Pt will explore gait with walker and make decisions about appropriateness of use for improved safety.    Baseline using walking sticks, has had 1 fall since starting PT    Status Achieved      PT SHORT TERM GOAL #4   Title Pt ind with initial HEP    Status Achieved               PT Long Term Goals - 05/09/21 0945       PT LONG TERM GOAL #1   Title Improve 5x sit to stand to </= 14 sec.    Baseline 9 sec    Status Achieved      PT LONG TERM GOAL #2   Title Pt will demo up/down stairs x 3 rounds (4 6 inch steps) using bil UEs and safe strategy to demo improved safety on stairs    Status Achieved                    Plan - 05/09/21 1322     Clinical Impression Statement Pt had another skin cancer surgery yesterday and is restricted with lifting x 1-2 weeks.  Pt met several goals today demonstrating 5x sit to stand over 3 trials with times of 13, 11, and 9 sec, improving with repeated repetition.  He also demo'd 3x successful trials using single hand rail on stairs (4x6" steps) using alternating pattern with good control and without LOB.  He performed 6' walk test outdoors today with PT directing him through community negotiation challenges of curbs and up/down a paved hill.  PT introduced using walking sticks and sidestepping off curb today using stronger side (Rt side) advantage of step down with Lt foot and step up with Rt foot.  Pt was able to perform this 10x with supervision and no LOB.  He is able to demo going the other way with weaker side but has much less confidence with this.  Pt will be starting radiation soon but wishes to continue PT as long as his side effects allow him to continue participating.    Comorbidities seizures, memory deficits, osteoporosis with Hx of multiple fractures    PT Frequency 2x / week    PT Duration 6 weeks  PT Treatment/Interventions ADLs/Self Care Home Management;Gait training;Stair training;Functional mobility training;Therapeutic activities;Therapeutic exercise;Balance training;Neuromuscular re-education;Manual techniques;Patient/family education;Passive range of motion    PT Next Visit Plan gait belt throughout session, balance, reciprocal UE/LE movement, functional strength, review curb sidestep off step down with Lt, up with Rt using walking sticks    PT Home Exercise Plan [id number]    Consulted and Agree with Plan of Care Patient    Family Member Consulted Pt's wife             Patient will benefit from skilled therapeutic intervention in order to improve the following deficits and impairments:     Visit Diagnosis: Other abnormalities of gait  and mobility  Muscle weakness (generalized)  Unspecified lack of coordination  Repeated falls     Problem List Patient Active Problem List   Diagnosis Date Noted   Liver cirrhosis secondary to NASH ([place])    GERD (gastroesophageal reflux disease)    Pancytopenia ([facility])    Osteomyelitis of jaw 12/29/2020   Acute osteomyelitis of mandible 12/26/2020   S/P inguinal hernia repair 01/14/2020   At high risk for falls    S/P shoulder surgery 03/05/2018   Excessive daytime sleepiness 02/18/2018   Crohn's disease of colon with rectal bleeding ([place]) 02/18/2018   Blood in stool 02/18/2018   Iron deficiency anemia due to chronic blood loss 02/18/2018   Iron deficiency anemia due to sideropenic dysphagia 02/18/2018   Renal calculus, left 02/02/2018   History of sepsis 12/16/2017   Renal cyst, acquired, left 07/08/2017   Frequent falls 07/08/2017   Renal lesion 05/28/2017   MSSA bacteremia 05/06/2017   Confusion 04/02/2017   Syncope 08/30/2016   Depression 04/30/2016   Memory loss 04/30/2016   OSA on CPAP 10/31/2015   Nonepileptic episode ([place]) 04/25/2015   Seizure ([place]) 04/04/2013   Abnormal EKG 03/12/2013   DOE (dyspnea on exertion) 03/12/2013   Complex partial seizure ([place]) 04/19/2012   DM2 (diabetes mellitus, type 2) ([place]) 09/25/2011   OTHER DYSPHAGIA 03/27/2009   Cough 11/10/2008   GERD 12/07/2007   COLONIC POLYPS, ADENOMATOUS, HX OF 12/07/2007   EXTERNAL HEMORRHOIDS 09/11/2007   CROHN'S DISEASE, LARGE AND SMALL INTESTINES 09/11/2007   OSTEOPOROSIS 09/11/2007   HYPERLIPIDEMIA NEC/NOS 02/27/2007   Essential hypertension 02/27/2007   Baruch Merl, PT 05/09/21 1:31 PM   [facility] @ [place], [place], [address] Phone: [phone]   Fax:  [phone]  Name: [name] [name] MRN: [id number] Date of Birth: [birth year]

## 2021-05-09 NOTE — Progress Notes (Signed)
[facility] Psychosocial Distress Screening Clinical Social Work  Clinical Social Work was referred by distress screening protocol.  The patient scored a 7 on the Psychosocial Distress Thermometer which indicates moderate distress. Clinical Social Worker contacted patient by phone to assess for distress and other psychosocial needs. Wife called back, "he has a lot of medical issues, every day we have somewhere to go."  They have good support from family and friends.  He does not drive any more, he has balance problems/falls.  Wife has retired from her job so she is a full time caregiver.  "He has never been diagnosed w dementia, but it is basically that."  His multiple medical issues are longstanding, family also cares for a special needs son, and wife's elderly mother.  They have caregivers for son.  Encouraged wife to practice self care as she is supporting multiple family members with multiple significant needs.  Mailed information on Support Center and [employer].    ONCBCN DISTRESS SCREENING 05/08/2021  Screening Type Initial Screening  Distress experienced in past week (1-10) 7  Emotional problem type Depression;Nervousness/Anxiety;Adjusting to illness;Feeling hopeless  Spiritual/Religous concerns type Facing my mortality;Loss of sense of purpose  Physical Problem type Pain;Sleep/insomnia;Mouth sores/swallowing;Talking;Constipation/diarrhea;Skin dry/itchy  Physician notified of physical symptoms Yes  Referral to clinical psychology No  Referral to clinical social work Yes  Referral to dietition Yes  Referral to financial advocate No  Referral to support programs Yes  Referral to palliative care No    Clinical Social Worker follow up needed: No.  If yes, follow up plan:  Beverely Pace, [place], LCSW Clinical Social Worker Phone:  [phone]

## 2021-05-09 NOTE — Progress Notes (Signed)
[facility] CSW Psychosocial Distress Screening  Social Work was referred by distress screening protocol. The patient scored a 7 on the Psychosocial Distress Thermometer which indicates moderate distress. Social Work Theatre manager contacted patient by phone to assess for distress and other psychosocial needs. Intern left a voicemail offering support and encouraging patient to return call.  ONCBCN DISTRESS SCREENING 05/08/2021  Screening Type Initial Screening  Distress experienced in past week (1-10) 7  Emotional problem type Depression;Nervousness/Anxiety;Adjusting to illness;Feeling hopeless  Spiritual/Religous concerns type Facing my mortality;Loss of sense of purpose  Physical Problem type Pain;Sleep/insomnia;Mouth sores/swallowing;Talking;Constipation/diarrhea;Skin dry/itchy  Physician notified of physical symptoms Yes  Referral to clinical psychology No  Referral to clinical social work Yes  Referral to dietition Yes  Referral to financial advocate No  Referral to support programs Yes  Referral to palliative care No    Rosary Lively, Social Work Intern Supervised by Gwinda Maine, LCSW

## 2021-05-10 ENCOUNTER — Encounter: Payer: Medicare Other | Admitting: Physical Therapy

## 2021-05-11 ENCOUNTER — Other Ambulatory Visit: Payer: Self-pay

## 2021-05-11 ENCOUNTER — Encounter: Payer: Self-pay | Admitting: Radiation Oncology

## 2021-05-11 ENCOUNTER — Ambulatory Visit
Admission: RE | Admit: 2021-05-11 | Discharge: 2021-05-11 | Disposition: A | Discharge status: Home / Self Care | Payer: Medicare Other | Source: Ambulatory Visit | Attending: Radiation Oncology | Admitting: Radiation Oncology

## 2021-05-11 ENCOUNTER — Ambulatory Visit: Payer: Medicare Other | Admitting: Physical Therapy

## 2021-05-11 DIAGNOSIS — M6281 Muscle weakness (generalized): Secondary | ICD-10-CM

## 2021-05-11 DIAGNOSIS — Z510 Encounter for antineoplastic radiation therapy: Secondary | ICD-10-CM | POA: Insufficient documentation

## 2021-05-11 DIAGNOSIS — R2689 Other abnormalities of gait and mobility: Secondary | ICD-10-CM

## 2021-05-11 DIAGNOSIS — C44329 Squamous cell carcinoma of skin of other parts of face: Secondary | ICD-10-CM | POA: Diagnosis not present

## 2021-05-11 DIAGNOSIS — R279 Unspecified lack of coordination: Secondary | ICD-10-CM

## 2021-05-11 DIAGNOSIS — R296 Repeated falls: Secondary | ICD-10-CM

## 2021-05-11 NOTE — Therapy (Signed)
[facility] @ [facility] [place], [place], [address] Phone: [phone]   Fax:  [phone]  Physical Therapy Treatment  Patient Details  Name: [name] [name] MRN: [id number] Date of Birth: 1961-01-29 Referring Provider (PT): [name], MD   Encounter Date: 05/11/2021   PT End of Session - 05/11/21 1155     Visit Number 16    Date for PT Re-Evaluation 05/17/21    Authorization Type [employer]    Authorization - Visit Number 16    Authorization - Number of Visits 60    Progress Note Due on Visit 20    PT Start Time 1100    PT Stop Time 1145    PT Time Calculation (min) 45 min    Equipment Utilized During Treatment Gait belt    Activity Tolerance Patient tolerated treatment well             Past Medical History:  Diagnosis Date   Adenomatous colon polyp 10/1999   Anxiety    Arthritis    neck, yoga helps.   Cataract    Complex partial seizure ([place])    last seizure was 08-30-2018   Crohn's disease of small and large intestines ([place] [facility]) 1999   Depression    Diabetes mellitus    no meds at this time 10-24-17   Esophageal stricture    External hemorrhoids    GERD (gastroesophageal reflux disease)    Glaucoma    History of kidney stones    3 large stones still present   Hyperlipemia    Hypertension    resolved with weight loss    Inguinal hernia, bilateral 12/2019   Liver cirrhosis secondary to NASH ([facility] [place])    Migraines    Osteoporosis    PONV (postoperative nausea and vomiting)    PTSD (post-traumatic stress disorder)    Renal cyst, acquired, left 07/08/2017   Skin cancer    squamous cell multiple; [facility]; followed every 3 months.   Sleep apnea    CPAP machine, uses nightly   Staphylococcus aureus bacteremia with sepsis ([place] [facility]) 05/28/2017   MRSA 2012    Past Surgical History:  Procedure Laterality Date   CATARACT EXTRACTION Bilateral    COLONOSCOPY     DEBRIDEMENT MANDIBLE N/A 12/07/2020    Procedure: DEBRIDEMENT OF MANDIBLE;  Surgeon: [name], DMD;  Location: [facility];  Service: Oral Surgery;  Laterality: N/A;   ELBOW SURGERY  2012   elbow MRSA infection    EYE SURGERY     cataracts removed, /w IOL   GROSS TEETH DEBRIDEMENT N/A 12/29/2020   Procedure: MANDIBULAR  DEBRIDEMENT;  Surgeon: [name], DMD;  Location: [facility];  Service: Dentistry;  Laterality: N/A;   INCISION AND DRAINAGE ABSCESS N/A 12/07/2020   Procedure: INCISION AND DRAINAGE MANDIBULAR ABSCESS;  Surgeon: [name], DMD;  Location: [facility];  Service: Oral Surgery;  Laterality: N/A;   INGUINAL HERNIA REPAIR Right 01/14/2020   Procedure: OPEN RIGHT HERNIA REPAIR INGUINAL WITH MESH;  Surgeon: [name], MD;  Location: [facility];  Service: General;  Laterality: Right;   INSERTION OF MESH N/A 07/01/2016   Procedure: INSERTION OF MESH;  Surgeon: [name], MD;  Location: [facility];  Service: General;  Laterality: N/A;   IR URETERAL STENT LEFT NEW ACCESS W/O SEP NEPHROSTOMY CATH  02/02/2018   NEPHROLITHOTOMY Left 02/02/2018   Procedure: NEPHROLITHOTOMY PERCUTANEOUS;  Surgeon: [name], MD;  Location: [facility];  Service: Urology;  Laterality: Left;   POLYPECTOMY     SCALP LACERATION REPAIR Right 10/16/2017   From fall/staples   SHOULDER ARTHROSCOPY Right 03/05/2018   Procedure: ARTHROSCOPY SHOULDER AND OPEN DISTAL CLAVICLE EXCISION;  Surgeon: [name], MD;  Location: [facility];  Service: Orthopedics;  Laterality: Right;   SHOULDER SURGERY     SINUS SURGERY WITH INSTATRAK     TEE WITHOUT CARDIOVERSION N/A 05/09/2017   Procedure: TRANSESOPHAGEAL ECHOCARDIOGRAM (TEE);  Surgeon: [name], MD;  Location: [facility];  Service: Cardiovascular;  Laterality: N/A;   TOOTH EXTRACTION N/A 12/07/2020   Procedure: DENTAL EXTRACTIONS OF TEETH TWENTY TO THIRTY;  Surgeon: [name], DMD;  Location: [facility];  Service: Oral Surgery;  Laterality: N/A;   TOOTH EXTRACTION N/A 12/29/2020   Procedure:  DENTAL RESTORATION/EXTRACTIONS;  Surgeon: [name], DMD;  Location: [facility];  Service: Dentistry;  Laterality: N/A;   TRANSTHORACIC ECHOCARDIOGRAM  02/22/2011   EF 55-65%; increased pattern of LVH with mild conc hypertrophy, abnormal relaxation & increased filling pressure (grade 2 diastolic dysfunction); atrial septum thickened (lipomatous hypertrophy)   UMBILICAL HERNIA REPAIR N/A 07/01/2016   Procedure: UMBILICAL HERNIA REPAIR WITH MESH;  Surgeon: [name], MD;  Location: [facility];  Service: General;  Laterality: N/A;   UPPER GASTROINTESTINAL ENDOSCOPY     VASECTOMY      There were no vitals filed for this visit.   Subjective Assessment - 05/11/21 1106     Subjective I had skin surgery this week and it hurts.  No issues after last time.    Pertinent History on disability, has memory problems, liver cirrhosis, Ch[place], osteoporosis with multiple fractures including C6, thrombocytopenia, gluacoma (loss of peripheral vision).  Has lost 75lb through walking but concerned about safety    How long can you walk comfortably? 2.5 miles with walking sticks    Diagnostic tests C6 fracture and ligamentum flavum tear - non-surgical    Patient Stated Goals work on coming down and up stairs, balance    Currently in Pain? Yes    Pain Score 5     Pain Location Face                               [facility] Adult PT Treatment/Exercise - 05/11/21 0001       Ambulation/Gait   Curb --   up/down using side step down and up, taking advantage of Rt stronger side for Lt step down, Rt step up, x 10 reps   Gait Comments Gait outdoors on paved surfaces without walking poles no loss of balance      Lumbar Exercises: Aerobic   Nustep L3 10 min while discussing status      Lumbar Exercises: Standing   Other Standing Lumbar Exercises holding 2 3# weights snatch and press overhead alternating 10x each side      Lumbar Exercises: Seated   Other Seated Lumbar Exercises blue plyo ball hip  to hip, hip to shoulder each way,golf swings;  ear to ear 1x10    Other Seated Lumbar Exercises blue plyo ball sit ups 15x      Knee/Hip Exercises: Seated   Sit to Sand 2 sets;5 reps   6.6#                      PT Short Term Goals - 04/05/21 0926       PT SHORT TERM GOAL #1   Title  Patient will complete 3 minute walk test using LRAD with only supervision to indicate decreased fall risk.    Status Achieved      PT SHORT TERM GOAL #2   Title Pt will verbalize and demo safe strategies for stair pattern (step to for descending) and gait patterns/pace (slow down) without AD    Status Achieved      PT SHORT TERM GOAL #3   Title PT and Pt will explore gait with walker and make decisions about appropriateness of use for improved safety.    Baseline using walking sticks, has had 1 fall since starting PT    Status Achieved      PT SHORT TERM GOAL #4   Title Pt ind with initial HEP    Status Achieved               PT Long Term Goals - 05/09/21 0945       PT LONG TERM GOAL #1   Title Improve 5x sit to stand to </= 14 sec.    Baseline 9 sec    Status Achieved      PT LONG TERM GOAL #2   Title Pt will demo up/down stairs x 3 rounds (4 6 inch steps) using bil UEs and safe strategy to demo improved safety on stairs    Status Achieved                   Plan - 05/11/21 1156     Clinical Impression Statement The patient is able to participate in outdoor ambulation on paved terrain and multiple curb negotiations without loss of balance today.  He demonstrates a safe pattern of lateral step downs and ups on the curb.   He is able to perform loaded strengthening including sit to stands with weight and snatch and press.  Therapist providing supervision and at times CGA for safety with gait belt.  Resasses progress toward goals next week.  He remains highly motivated with PT and recognizes his overall improvement since start of care.    Comorbidities seizures, memory  deficits, osteoporosis with Hx of multiple fractures    Examination-Activity Limitations Locomotion Level;Transfers;Stand;Bend    Rehab Potential Good    PT Frequency 2x / week    PT Duration 6 weeks    PT Treatment/Interventions ADLs/Self Care Home Management;Gait training;Stair training;Functional mobility training;Therapeutic activities;Therapeutic exercise;Balance training;Neuromuscular re-education;Manual techniques;Patient/family education;Passive range of motion    PT Next Visit Plan gait belt throughout session, balance, reciprocal UE/LE movement, functional strength, review curb sidestep off step down with Lt, up with Rt using walking sticks    PT Home Exercise Plan [id number]             Patient will benefit from skilled therapeutic intervention in order to improve the following deficits and impairments:  Decreased coordination, Decreased mobility, Postural dysfunction, Pain, Decreased safety awareness, Impaired perceived functional ability, Decreased balance, Decreased cognition, Impaired flexibility, Dizziness  Visit Diagnosis: Other abnormalities of gait and mobility  Muscle weakness (generalized)  Unspecified lack of coordination  Repeated falls     Problem List Patient Active Problem List   Diagnosis Date Noted   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 05/11/2021   Liver cirrhosis secondary to NASH ([facility])    GERD (gastroesophageal reflux disease)    Pancytopenia ([facility])    Osteomyelitis of jaw 12/29/2020   Acute osteomyelitis of mandible 12/26/2020   S/P inguinal hernia repair 01/14/2020   At high risk for falls  S/P shoulder surgery 03/05/2018   Excessive daytime sleepiness 02/18/2018   Crohn's disease of colon with rectal bleeding ([facility]) 02/18/2018   Blood in stool 02/18/2018   Iron deficiency anemia due to chronic blood loss 02/18/2018   Iron deficiency anemia due to sideropenic dysphagia 02/18/2018   Renal calculus, left 02/02/2018   History of  sepsis 12/16/2017   Renal cyst, acquired, left 07/08/2017   Frequent falls 07/08/2017   Renal lesion 05/28/2017   MSSA bacteremia 05/06/2017   Confusion 04/02/2017   Syncope 08/30/2016   Depression 04/30/2016   Memory loss 04/30/2016   OSA on CPAP 10/31/2015   Nonepileptic episode ([place]) 04/25/2015   Seizure ([place]) 04/04/2013   Abnormal EKG 03/12/2013   DOE (dyspnea on exertion) 03/12/2013   Complex partial seizure ([place]) 04/19/2012   DM2 (diabetes mellitus, type 2) ([facility]) 09/25/2011   OTHER DYSPHAGIA 03/27/2009   Cough 11/10/2008   GERD 12/07/2007   COLONIC POLYPS, ADENOMATOUS, HX OF 12/07/2007   EXTERNAL HEMORRHOIDS 09/11/2007   CROHN'S DISEASE, LARGE AND SMALL INTESTINES 09/11/2007   OSTEOPOROSIS 09/11/2007   HYPERLIPIDEMIA NEC/NOS 02/27/2007   Essential hypertension 02/27/2007   Ruben Im, PT 05/11/21 12:05 PM Phone: [phone] Fax: [phone]  Alvera Singh, PT 05/11/2021, 12:05 PM  [facility] @ [address] [place], [place], [address] Phone: [phone]   Fax:  [phone]  Name: LUKKA BLACK MRN: [id number] Date of Birth: Mar 21, 1961

## 2021-05-15 ENCOUNTER — Encounter: Payer: Self-pay | Admitting: Physical Therapy

## 2021-05-15 ENCOUNTER — Other Ambulatory Visit: Payer: Self-pay

## 2021-05-15 ENCOUNTER — Ambulatory Visit: Payer: Medicare Other | Admitting: Physical Therapy

## 2021-05-15 DIAGNOSIS — M6281 Muscle weakness (generalized): Secondary | ICD-10-CM

## 2021-05-15 DIAGNOSIS — R279 Unspecified lack of coordination: Secondary | ICD-10-CM

## 2021-05-15 DIAGNOSIS — R296 Repeated falls: Secondary | ICD-10-CM

## 2021-05-15 DIAGNOSIS — R2689 Other abnormalities of gait and mobility: Secondary | ICD-10-CM

## 2021-05-15 NOTE — Therapy (Signed)
[name] [name], [name] [name], [name] [name]  Physical Therapy Treatment  Patient Details  Name: [name] [name] MRN: [id number] Date of Birth: Jan 12, 1961 Referring Provider (PT): [name], MD   Encounter Date: 05/15/2021   PT End of Session - 05/15/21 1104     Visit Number 17    Date for PT Re-Evaluation 05/17/21    Authorization Type [employer]    Authorization - Visit Number 17    Authorization - Number of Visits 60    Progress Note Due on Visit 20    PT Start Time 1100    PT Stop Time 1145    PT Time Calculation (min) 45 min    Equipment Utilized During Treatment Gait belt    Activity Tolerance Patient tolerated treatment well    Behavior During Therapy [facility] for tasks assessed/performed             Past Medical History:  Diagnosis Date   Adenomatous colon polyp 10/1999   Anxiety    Arthritis    neck, yoga helps.   Cataract    Complex partial seizure ([place])    last seizure was 08-30-2018   Crohn's disease of small and large intestines ([place]) 1999   Depression    Diabetes mellitus    no meds at this time 10-24-17   Esophageal stricture    External hemorrhoids    GERD (gastroesophageal reflux disease)    Glaucoma    History of kidney stones    3 large stones still present   Hyperlipemia    Hypertension    resolved with weight loss    Inguinal hernia, bilateral 12/2019   Liver cirrhosis secondary to NASH ([place])    Migraines    Osteoporosis    PONV (postoperative nausea and vomiting)    PTSD (post-traumatic stress disorder)    Renal cyst, acquired, left 07/08/2017   Skin cancer    squamous cell multiple; [place]; followed every 3 months.   Sleep apnea    CPAP machine, uses nightly   Staphylococcus aureus bacteremia with sepsis ([place]) 05/28/2017   MRSA 2012    Past Surgical History:  Procedure Laterality Date   CATARACT EXTRACTION Bilateral     COLONOSCOPY     DEBRIDEMENT MANDIBLE N/A 12/07/2020   Procedure: DEBRIDEMENT OF MANDIBLE;  Surgeon: [name], DMD;  Location: [place];  Service: Oral Surgery;  Laterality: N/A;   ELBOW SURGERY  2012   elbow MRSA infection    EYE SURGERY     cataracts removed, /w IOL   GROSS TEETH DEBRIDEMENT N/A 12/29/2020   Procedure: MANDIBULAR  DEBRIDEMENT;  Surgeon: [name], DMD;  Location: [place];  Service: Dentistry;  Laterality: N/A;   INCISION AND DRAINAGE ABSCESS N/A 12/07/2020   Procedure: INCISION AND DRAINAGE MANDIBULAR ABSCESS;  Surgeon: [name], DMD;  Location: [place];  Service: Oral Surgery;  Laterality: N/A;   INGUINAL HERNIA REPAIR Right 01/14/2020   Procedure: OPEN RIGHT HERNIA REPAIR INGUINAL WITH MESH;  Surgeon: [name], MD;  Location: [facility];  Service: General;  Laterality: Right;   INSERTION OF MESH N/A 07/01/2016   Procedure: INSERTION OF MESH;  Surgeon: [name], MD;  Location: [place];  Service: General;  Laterality: N/A;   IR URETERAL STENT LEFT NEW ACCESS W/O SEP NEPHROSTOMY CATH  02/02/2018   NEPHROLITHOTOMY Left 02/02/2018   Procedure: NEPHROLITHOTOMY PERCUTANEOUS;  Surgeon:  [name], MD;  Location: [facility];  Service: Urology;  Laterality: Left;   POLYPECTOMY     SCALP LACERATION REPAIR Right 10/16/2017   From fall/staples   SHOULDER ARTHROSCOPY Right 03/05/2018   Procedure: ARTHROSCOPY SHOULDER AND OPEN DISTAL CLAVICLE EXCISION;  Surgeon: [name], MD;  Location: [place];  Service: Orthopedics;  Laterality: Right;   SHOULDER SURGERY     SINUS SURGERY WITH INSTATRAK     TEE WITHOUT CARDIOVERSION N/A 05/09/2017   Procedure: TRANSESOPHAGEAL ECHOCARDIOGRAM (TEE);  Surgeon: [name], MD;  Location: [facility];  Service: Cardiovascular;  Laterality: N/A;   TOOTH EXTRACTION N/A 12/07/2020   Procedure: DENTAL EXTRACTIONS OF TEETH TWENTY TO THIRTY;  Surgeon: [name], DMD;  Location: [place];  Service: Oral Surgery;  Laterality:  N/A;   TOOTH EXTRACTION N/A 12/29/2020   Procedure: DENTAL RESTORATION/EXTRACTIONS;  Surgeon: [name], DMD;  Location: [place];  Service: Dentistry;  Laterality: N/A;   TRANSTHORACIC ECHOCARDIOGRAM  02/22/2011   EF 55-65%; increased pattern of LVH with mild conc hypertrophy, abnormal relaxation & increased filling pressure (grade 2 diastolic dysfunction); atrial septum thickened (lipomatous hypertrophy)   UMBILICAL HERNIA REPAIR N/A 07/01/2016   Procedure: UMBILICAL HERNIA REPAIR WITH MESH;  Surgeon: [name], MD;  Location: [place];  Service: General;  Laterality: N/A;   UPPER GASTROINTESTINAL ENDOSCOPY     VASECTOMY      There were no vitals filed for this visit.   Subjective Assessment - 05/15/21 1103     Subjective I am a lot better.  I feel 80% confident in my balance.    Pertinent History on disability, has memory problems, liver cirrhosis, [place], osteoporosis with multiple fractures including C6, thrombocytopenia, gluacoma (loss of peripheral vision).  Has lost 75lb through walking but concerned about safety    Limitations Walking    How long can you walk comfortably? 2.5 miles with walking sticks    Diagnostic tests C6 fracture and ligamentum flavum tear - non-surgical    Patient Stated Goals work on coming down and up stairs, balance    Currently in Pain? No/denies                [facility] PT Assessment - 05/15/21 0001       Assessment   Medical Diagnosis R29.6 (ICD-10-CM) - Repeated falls    Referring Provider (PT) [name], MD    Hand Dominance Right    Next MD Visit Oct      Standardized Balance Assessment   Standardized Balance Assessment Timed Up and Go Test;Dynamic Gait Index;Five Times Sit to Stand    Five times sit to stand comments  3 trials: 13 sec, 11 sec, 9 sec      Dynamic Gait Index   Level Surface Normal    Change in Gait Speed Normal    Gait with Horizontal Head Turns Severe Impairment   with Lt head turn   Gait with Vertical Head  Turns Moderate Impairment   stops walking to change head position   Gait and Pivot Turn Mild Impairment    Step Over Obstacle Mild Impairment    Step Around Obstacles Moderate Impairment    Steps Mild Impairment    Total Score 14    DGI comment: 14/24      Timed Up and Go Test   TUG Normal TUG    Normal TUG (seconds) 10  [place] Adult PT Treatment/Exercise - 05/15/21 0001       Neuro Re-ed    Neuro Re-ed Details  cross body yellow plyo ball diagonal lift with trunk rotation and weight shift 10x each way, standing cross body punches x 20 2lb dumbbell with weight shifting      Exercises   Exercises Knee/Hip      Knee/Hip Exercises: Standing   Lateral Step Up 5 reps;Hand Hold: 2;Step Height: 6"    Lateral Step Up Limitations up/up/down/down up across 6" step                 Balance Exercises - 05/15/21 0001       Balance Exercises: Standing   Retro Gait 1 rep   hallway, close supervision, min A   Marching Dynamic;Forwards   hallway, min A for LOB with gait belt   Other Standing Exercises LVST BIG - fwd step over hurdle and ipsilateral UE reach 5x5", lateral hurdle step over with ipsilateral reach 5x5" holds    Other Standing Exercises Comments march taps tall cones alt LEs x 20, then double tap cones alt x 20                  PT Short Term Goals - 04/05/21 0926       PT SHORT TERM GOAL #1   Title Patient will complete 3 minute walk test using LRAD with only supervision to indicate decreased fall risk.    Status Achieved      PT SHORT TERM GOAL #2   Title Pt will verbalize and demo safe strategies for stair pattern (step to for descending) and gait patterns/pace (slow down) without AD    Status Achieved      PT SHORT TERM GOAL #3   Title PT and Pt will explore gait with walker and make decisions about appropriateness of use for improved safety.    Baseline using walking sticks, has had 1 fall since starting PT     Status Achieved      PT SHORT TERM GOAL #4   Title Pt ind with initial HEP    Status Achieved               PT Long Term Goals - 05/15/21 1344       PT LONG TERM GOAL #1   Title Improve 5x sit to stand to </= 14 sec.    Baseline 9 sec    Status Achieved      PT LONG TERM GOAL #2   Title Pt will demo up/down stairs x 3 rounds (4 6 inch steps) using bil UEs and safe strategy to demo improved safety on stairs    Status Achieved      PT LONG TERM GOAL #3   Title Pt will demo safety/no LOB with community ambulation for 6' walk test, curb up/down and grassy surface with supervision using least restrictive AD or no AD.    Baseline grassy surface needs more practice, making good progress with side stepping off curb    Status On-going      PT LONG TERM GOAL #4   Title Pt will report at least 50% more confidence in balance with daily tasks    Baseline 30-40%    Status On-going      PT LONG TERM GOAL #5   Title Ind with advanced HEP and understand how to safely progress    Status On-going      Additional Long Term Goals  Additional Long Term Goals Yes      PT LONG TERM GOAL #6   Title Improved DGI score to at least 18/24 to reduce fall risk with dynamic gait challenges    Baseline 8/24 first visit, 14/24 at re-eval 05/15/21    Status New                   Plan - 05/15/21 1339     Clinical Impression Statement Pt has made excellent progress in strength, gait stability and endurance, and functional strength since starting PT.  He demos safety with use of bil walking poles during outdoor ambulation and recently learned sidestepping off curb for improved safety.  He is able to demo stairs using alternating pattern and single hand rail with control.  5x sit to stand has improved to 9 seconds, and TUG is 10 sec.  DGI has improved from 8/24 to 14/24 today.  He has greatest challenges with coordination of reciprocal UE/LE tasks, cross body movement patterns, dynamic gait  challenges such as changing directions and multi-directional stepping with and without reach.  For these tasks a gait belt and/or min A is used for safety for intermittent LOB.  He is unable to balance in SLS.  He has memory challenges which further challenge successful repeated reps of an exercise or task performance from PT's verbal commands.  Pt is approaching the start of radiation for skin cancer.  He has had multiple facial surgeries for skin cancer interrupting his care within this certification period.  Given Pt's medical history and fall with fracture history, Pt will likely regress from his current state of progress and be of greater fall risk without PT.  PT recommends continued skilled intervention to maintain gains made thus far with PT, especially during radiation if side effects allow for continue participation with PT.    Personal Factors and Comorbidities Comorbidity 1;Comorbidity 2;Comorbidity 3+;Behavior Pattern;Time since onset of injury/illness/exacerbation    Comorbidities seizures, memory deficits, osteoporosis with Hx of multiple fractures    Examination-Activity Limitations Locomotion Level;Transfers;Stand;Bend    Examination-Participation Restrictions Community Activity;Driving    Stability/Clinical Decision Making Evolving/Moderate complexity    Rehab Potential Good    PT Frequency 2x / week    PT Duration 8 weeks   2x/week x 4 weeks, then taper to 1x/week x 4 weeks if ready   PT Treatment/Interventions ADLs/Self Care Home Management;Gait training;Stair training;Functional mobility training;Therapeutic activities;Therapeutic exercise;Balance training;Neuromuscular re-education;Manual techniques;Patient/family education;Passive range of motion    PT Next Visit Plan work on stagger stance with head turns, then progress to gait with head turns, LVST BIG step and reach, march walking, reciprocal UE/LE movement seated, coordination and balance    PT [place] and Agree with Plan of Care Patient    Family Member Consulted Pt's wife             Patient will benefit from skilled therapeutic intervention in order to improve the following deficits and impairments:  Decreased coordination, Decreased mobility, Postural dysfunction, Pain, Decreased safety awareness, Impaired perceived functional ability, Decreased balance, Decreased cognition, Impaired flexibility, Dizziness  Visit Diagnosis: Other abnormalities of gait and mobility - Plan: PT plan of care cert/re-cert  Muscle weakness (generalized) - Plan: PT plan of care cert/re-cert  Unspecified lack of coordination - Plan: PT plan of care cert/re-cert  Repeated falls - Plan: PT plan of care cert/re-cert     Problem List Patient Active Problem List   Diagnosis Date Noted  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 05/11/2021   Liver cirrhosis secondary to NASH [facility])    GERD (gastroesophageal reflux disease)    Pancytopenia (H[facility])    Osteomyelitis of jaw 12/29/2020   Acute osteomyelitis of mandible 12/26/2020   S/P inguinal hernia repair 01/14/2020   At high risk for falls    S/P shoulder surgery 03/05/2018   Excessive daytime sleepiness 02/18/2018   Crohn's disease of colon with rectal bleeding ([place]) 02/18/2018   Blood in stool 02/18/2018   Iron deficiency anemia due to chronic blood loss 02/18/2018   Iron deficiency anemia due to sideropenic dysphagia 02/18/2018   Renal calculus, left 02/02/2018   History of sepsis 12/16/2017   Renal cyst, acquired, left 07/08/2017   Frequent falls 07/08/2017   Renal lesion 05/28/2017   MSSA bacteremia 05/06/2017   Confusion 04/02/2017   Syncope 08/30/2016   Depression 04/30/2016   Memory loss 04/30/2016   OSA on CPAP 10/31/2015   Nonepileptic episode ([place]) 04/25/2015   Seizure ([place]) 04/04/2013   Abnormal EKG 03/12/2013   DOE (dyspnea on exertion) 03/12/2013   Complex partial seizure ([place]) 04/19/2012   DM2 (diabetes  mellitus, type 2) ([place]) 09/25/2011   OTHER DYSPHAGIA 03/27/2009   Cough 11/10/2008   GERD 12/07/2007   COLONIC POLYPS, ADENOMATOUS, HX OF 12/07/2007   EXTERNAL HEMORRHOIDS 09/11/2007   CROHN'S DISEASE, LARGE AND SMALL INTESTINES 09/11/2007   OSTEOPOROSIS 09/11/2007   HYPERLIPIDEMIA NEC/NOS 02/27/2007   Essential hypertension 02/27/2007    Baruch Merl, PT 05/15/21 1:46 PM   [place] @ [place], [name], [address] Phone: [phone]   Fax:  [phone]  Name: RECTOR DEVONSHIRE MRN: [id number] Date of Birth: [birth year]

## 2021-05-16 ENCOUNTER — Inpatient Hospital Stay: Payer: 59 | Attending: Radiation Oncology | Admitting: Dietician

## 2021-05-16 NOTE — Progress Notes (Signed)
Nutrition Assessment   Reason for Assessment: Head and Neck   ASSESSMENT: 60 year old male with recurrent SCC of right cheek. He is s/p partial maxillectomy with orbital resection on 10/18. He is receiving radiation therapy. Patient followed by Dr. Isidore Moos.  Past medical history includes NASH cirrhosis, sleep apnea, Crohn's, depression, anxiety, GERD, DM2, HTN, GERD, osteomyelitis of mandible s/p bottom teeth extractions.   Met with patient in clinic. He reports tolerating soft foods (pasta, eggs, potatoes, pancakes, soups). Patient typically eats one meal/day. He is drinking 1-2 Ensure and likes sherbet floats. Patient is lactose intolerant, lactase works well for him.    Nutrition Focused Physical Exam: deferred   Medications: folic acid, MVI, Ca 833 +D, lactase   Labs: reviewed   Anthropometrics:   Height: 5'11" Weight: 177.5 lb (11/15) UBW: 180 lb (6/2) BMI: 24.76    NUTRITION DIAGNOSIS: Inadequate oral intake related to cancer and associated treatments as evidenced by dietary recall, s/p partial maxillectomy, side effects of radiation therapy affecting ability to eat.    INTERVENTION:  Encouraged small frequent meals and snacks with foods easy to chew and swallow Educated on soft, moist high protein foods - handout with ideas and shake recipes provided  Continue drinking Ensure Plus/equivalent, recommend pt consume 3 daily for added calories and protein - coupons provided Recommended baking soda salt water rinses several times/day - handout with recipe provided Contact information provided    MONITORING, EVALUATION, GOAL: Patient will tolerate increased calories and protein to maintain weight   Next Visit: Thursday December 8 after radiation

## 2021-05-18 ENCOUNTER — Ambulatory Visit: Payer: Medicare Other | Admitting: Hematology and Oncology

## 2021-05-22 ENCOUNTER — Other Ambulatory Visit: Payer: Self-pay

## 2021-05-22 ENCOUNTER — Ambulatory Visit: Payer: Medicare Other | Admitting: Physical Therapy

## 2021-05-22 ENCOUNTER — Encounter: Payer: Self-pay | Admitting: Physical Therapy

## 2021-05-22 DIAGNOSIS — Z510 Encounter for antineoplastic radiation therapy: Secondary | ICD-10-CM | POA: Diagnosis not present

## 2021-05-22 DIAGNOSIS — R279 Unspecified lack of coordination: Secondary | ICD-10-CM

## 2021-05-22 DIAGNOSIS — M6281 Muscle weakness (generalized): Secondary | ICD-10-CM | POA: Diagnosis not present

## 2021-05-22 DIAGNOSIS — R296 Repeated falls: Secondary | ICD-10-CM

## 2021-05-22 DIAGNOSIS — R2689 Other abnormalities of gait and mobility: Secondary | ICD-10-CM

## 2021-05-22 DIAGNOSIS — C44329 Squamous cell carcinoma of skin of other parts of face: Secondary | ICD-10-CM | POA: Diagnosis not present

## 2021-05-22 NOTE — Therapy (Signed)
[facility] @ [address] [place], [place], [address] Phone: [phone]   Fax:  [phone]  Physical Therapy Treatment  Patient Details  Name: [name] [name] MRN: [id number] Date of Birth: 1961-04-25 Referring Provider (PT): [name], MD   Encounter Date: 05/22/2021   PT End of Session - 05/22/21 1023     Visit Number 18    Date for PT Re-Evaluation 05/17/21    Authorization Type [employer]    Authorization - Visit Number 18    Authorization - Number of Visits 60    Progress Note Due on Visit 20    PT Start Time 1020   Pt late   PT Stop Time 1100    PT Time Calculation (min) 40 min    Equipment Utilized During Treatment Gait belt    Activity Tolerance Patient tolerated treatment well    Behavior During Therapy [facility] for tasks assessed/performed             Past Medical History:  Diagnosis Date   Adenomatous colon polyp 10/1999   Anxiety    Arthritis    neck, yoga helps.   Cataract    Complex partial seizure ([place])    last seizure was 08-30-2018   Crohn's disease of small and large intestines ([place]) 1999   Depression    Diabetes mellitus    no meds at this time 10-24-17   Esophageal stricture    External hemorrhoids    GERD (gastroesophageal reflux disease)    Glaucoma    History of kidney stones    3 large stones still present   Hyperlipemia    Hypertension    resolved with weight loss    Inguinal hernia, bilateral 12/2019   Liver cirrhosis secondary to NASH ([place])    Migraines    Osteoporosis    PONV (postoperative nausea and vomiting)    PTSD (post-traumatic stress disorder)    Renal cyst, acquired, left 07/08/2017   Skin cancer    squamous cell multiple; [place]; followed every 3 months.   Sleep apnea    CPAP machine, uses nightly   Staphylococcus aureus bacteremia with sepsis ([place]) 05/28/2017   MRSA 2012    Past Surgical History:  Procedure Laterality Date   CATARACT EXTRACTION  Bilateral    COLONOSCOPY     DEBRIDEMENT MANDIBLE N/A 12/07/2020   Procedure: DEBRIDEMENT OF MANDIBLE;  Surgeon: [name], DMD;  Location: [place];  Service: Oral Surgery;  Laterality: N/A;   ELBOW SURGERY  2012   elbow MRSA infection    EYE SURGERY     cataracts removed, /w IOL   GROSS TEETH DEBRIDEMENT N/A 12/29/2020   Procedure: MANDIBULAR  DEBRIDEMENT;  Surgeon: [name], DMD;  Location: [place];  Service: Dentistry;  Laterality: N/A;   INCISION AND DRAINAGE ABSCESS N/A 12/07/2020   Procedure: INCISION AND DRAINAGE MANDIBULAR ABSCESS;  Surgeon: [name], DMD;  Location: [place];  Service: Oral Surgery;  Laterality: N/A;   INGUINAL HERNIA REPAIR Right 01/14/2020   Procedure: OPEN RIGHT HERNIA REPAIR INGUINAL WITH MESH;  Surgeon: [name], MD;  Location: [facility];  Service: General;  Laterality: Right;   INSERTION OF MESH N/A 07/01/2016   Procedure: INSERTION OF MESH;  Surgeon: [name], MD;  Location: [place];  Service: General;  Laterality: N/A;   IR URETERAL STENT LEFT NEW ACCESS W/O SEP NEPHROSTOMY CATH  02/02/2018   NEPHROLITHOTOMY Left 02/02/2018   Procedure: NEPHROLITHOTOMY  PERCUTANEOUS;  Surgeon: [name], MD;  Location: [facility];  Service: Urology;  Laterality: Left;   POLYPECTOMY     SCALP LACERATION REPAIR Right 10/16/2017   From fall/staples   SHOULDER ARTHROSCOPY Right 03/05/2018   Procedure: ARTHROSCOPY SHOULDER AND OPEN DISTAL CLAVICLE EXCISION;  Surgeon: [name], MD;  Location: [facility];  Service: Orthopedics;  Laterality: Right;   SHOULDER SURGERY     SINUS SURGERY WITH INSTATRAK     TEE WITHOUT CARDIOVERSION N/A 05/09/2017   Procedure: TRANSESOPHAGEAL ECHOCARDIOGRAM (TEE);  Surgeon: [name], MD;  Location: [facility];  Service: Cardiovascular;  Laterality: N/A;   TOOTH EXTRACTION N/A 12/07/2020   Procedure: DENTAL EXTRACTIONS OF TEETH TWENTY TO THIRTY;  Surgeon: [name], DMD;  Location: [place];  Service: Oral  Surgery;  Laterality: N/A;   TOOTH EXTRACTION N/A 12/29/2020   Procedure: DENTAL RESTORATION/EXTRACTIONS;  Surgeon: [name], DMD;  Location: [place];  Service: Dentistry;  Laterality: N/A;   TRANSTHORACIC ECHOCARDIOGRAM  02/22/2011   EF 55-65%; increased pattern of LVH with mild conc hypertrophy, abnormal relaxation & increased filling pressure (grade 2 diastolic dysfunction); atrial septum thickened (lipomatous hypertrophy)   UMBILICAL HERNIA REPAIR N/A 07/01/2016   Procedure: UMBILICAL HERNIA REPAIR WITH MESH;  Surgeon: [name], MD;  Location: [place];  Service: General;  Laterality: N/A;   UPPER GASTROINTESTINAL ENDOSCOPY     VASECTOMY      There were no vitals filed for this visit.   Subjective Assessment - 05/22/21 1022     Subjective I am worried about my first radiation session tomorrow.  I'm out of it today.    Pertinent History on disability, has memory problems, liver cirrhosis, [place], osteoporosis with multiple fractures including C6, thrombocytopenia, gluacoma (loss of peripheral vision).  Has lost 75lb through walking but concerned about safety    Limitations Walking    How long can you walk comfortably? 2.5 miles with walking sticks    Diagnostic tests C6 fracture and ligamentum flavum tear - non-surgical    Patient Stated Goals work on coming down and up stairs, balance    Currently in Pain? No/denies                               [facility] Adult PT Treatment/Exercise - 05/22/21 0001       Neuro Re-ed    Neuro Re-ed Details  cross body yellow plyo ball diagonal lift with trunk rotation and weight shift 10x each way in seated and standing, standing cross body punches x 20 2lb dumbbell with weight shifting      Lumbar Exercises: Seated   Long Arc Quad on Chair Strengthening;Weights;Both;20 reps    LAQ on Chair Weights (lbs) 3    LAQ on Chair Limitations chair + pad      Knee/Hip Exercises: Machines for Strengthening   Cybex Leg Press 1x20  120lb, 1x20 1x135lb      Knee/Hip Exercises: Standing   Gait Training stagger stance with head turns Lt/Rt x 10 in each stagger position, short break then gait with head turns Lt/Rt every three steps, min A      Knee/Hip Exercises: Seated   Marching Strengthening;20 reps;Weights    Marching Limitations 3lb, tap knee with opp hand to knee tap                 Balance Exercises - 05/22/21 0001       Balance Exercises:  Standing   Gait with Head Turns Forward   very difficult with head turns to [employer];Forwards;20 reps;Upper extremity assist 1   along wall, min A with gait belt for LOB   Other Standing Exercises LVST BIG - fwd step over hurdle and ipsilateral UE reach 5x5", lateral hurdle step over with ipsilateral reach 5x5" holds    Other Standing Exercises Comments march taps tall cones alt LEs x 20, then double tap cones alt x 20                  PT Short Term Goals - 04/05/21 0926       PT SHORT TERM GOAL #1   Title Patient will complete 3 minute walk test using LRAD with only supervision to indicate decreased fall risk.    Status Achieved      PT SHORT TERM GOAL #2   Title Pt will verbalize and demo safe strategies for stair pattern (step to for descending) and gait patterns/pace (slow down) without AD    Status Achieved      PT SHORT TERM GOAL #3   Title PT and Pt will explore gait with walker and make decisions about appropriateness of use for improved safety.    Baseline using walking sticks, has had 1 fall since starting PT    Status Achieved      PT SHORT TERM GOAL #4   Title Pt ind with initial HEP    Status Achieved               PT Long Term Goals - 05/15/21 1344       PT LONG TERM GOAL #1   Title Improve 5x sit to stand to </= 14 sec.    Baseline 9 sec    Status Achieved      PT LONG TERM GOAL #2   Title Pt will demo up/down stairs x 3 rounds (4 6 inch steps) using bil UEs and safe strategy to demo improved safety on  stairs    Status Achieved      PT LONG TERM GOAL #3   Title Pt will demo safety/no LOB with community ambulation for 6' walk test, curb up/down and grassy surface with supervision using least restrictive AD or no AD.    Baseline grassy surface needs more practice, making good progress with side stepping off curb    Status On-going      PT LONG TERM GOAL #4   Title Pt will report at least 50% more confidence in balance with daily tasks    Baseline 30-40%    Status On-going      PT LONG TERM GOAL #5   Title Ind with advanced HEP and understand how to safely progress    Status On-going      Additional Long Term Goals   Additional Long Term Goals Yes      PT LONG TERM GOAL #6   Title Improved DGI score to at least 18/24 to reduce fall risk with dynamic gait challenges    Baseline 8/24 first visit, 14/24 at re-eval 05/15/21    Status New                   Plan - 05/22/21 1240     Clinical Impression Statement Pt has first radiation for skin CA on face tomorrow and he arrives with stress and worry over this.  He did demo improved coordination of cross body movement patterns and reciprocal  coordination of UE/LE challenges today.  He was able to perform hurdle step over with UE reach and hold his balance in both fwd/lat stepovers with reach but did lose balance frequently with return step.  PT worked on stagger stance and gait with head turns which are very challenging for Pt when he turns head to Lt.  Min/Mod A used with gait belt with this.  PT alternated between balance and strength today with good tolerance.  Pt has another PT appt tomorrow.    Comorbidities seizures, memory deficits, osteoporosis with Hx of multiple fractures    PT Frequency 2x / week    PT Duration 8 weeks    PT Treatment/Interventions ADLs/Self Care Home Management;Gait training;Stair training;Functional mobility training;Therapeutic activities;Therapeutic exercise;Balance training;Neuromuscular  re-education;Manual techniques;Patient/family education;Passive range of motion    PT Next Visit Plan work on stagger stance with head turns, then progress to gait with head turns, LVST BIG step and reach, march walking, reciprocal UE/LE movement seated, coordination and balance    PT Home Exercise Plan [id number]    Consulted and Agree with Plan of Care Patient    Family Member Consulted Pt's wife             Patient will benefit from skilled therapeutic intervention in order to improve the following deficits and impairments:     Visit Diagnosis: Other abnormalities of gait and mobility  Muscle weakness (generalized)  Unspecified lack of coordination  Repeated falls  Lack of coordination     Problem List Patient Active Problem List   Diagnosis Date Noted   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 05/11/2021   Liver cirrhosis secondary to NASH ([place])    GERD (gastroesophageal reflux disease)    Pancytopenia ([place])    Osteomyelitis of jaw 12/29/2020   Acute osteomyelitis of mandible 12/26/2020   S/P inguinal hernia repair 01/14/2020   At high risk for falls    S/P shoulder surgery 03/05/2018   Excessive daytime sleepiness 02/18/2018   Crohn's disease of colon with rectal bleeding ([place]) 02/18/2018   Blood in stool 02/18/2018   Iron deficiency anemia due to chronic blood loss 02/18/2018   Iron deficiency anemia due to sideropenic dysphagia 02/18/2018   Renal calculus, left 02/02/2018   History of sepsis 12/16/2017   Renal cyst, acquired, left 07/08/2017   Frequent falls 07/08/2017   Renal lesion 05/28/2017   MSSA bacteremia 05/06/2017   Confusion 04/02/2017   Syncope 08/30/2016   Depression 04/30/2016   Memory loss 04/30/2016   OSA on CPAP 10/31/2015   Nonepileptic episode ([place]) 04/25/2015   Seizure ([place]) 04/04/2013   Abnormal EKG 03/12/2013   DOE (dyspnea on exertion) 03/12/2013   Complex partial seizure ([place]) 04/19/2012   DM2 (diabetes mellitus, type 2)  ([place]) 09/25/2011   OTHER DYSPHAGIA 03/27/2009   Cough 11/10/2008   GERD 12/07/2007   COLONIC POLYPS, ADENOMATOUS, HX OF 12/07/2007   EXTERNAL HEMORRHOIDS 09/11/2007   CROHN'S DISEASE, LARGE AND SMALL INTESTINES 09/11/2007   OSTEOPOROSIS 09/11/2007   HYPERLIPIDEMIA NEC/NOS 02/27/2007   Essential hypertension 02/27/2007    Baruch Merl, PT 05/22/21 12:44 PM   [facility] @ [address] [place], [place], [address] Phone: [phone]   Fax:  [phone]  Name: [name] [name] MRN: [id number] Date of Birth: 10-08-1960

## 2021-05-23 ENCOUNTER — Encounter: Payer: Self-pay | Admitting: Physical Therapy

## 2021-05-23 ENCOUNTER — Ambulatory Visit
Admission: RE | Admit: 2021-05-23 | Discharge: 2021-05-23 | Disposition: A | Discharge status: Home / Self Care | Payer: Medicare Other | Source: Ambulatory Visit | Attending: Radiation Oncology | Admitting: Radiation Oncology

## 2021-05-23 ENCOUNTER — Ambulatory Visit: Payer: Medicare Other | Admitting: Physical Therapy

## 2021-05-23 DIAGNOSIS — R296 Repeated falls: Secondary | ICD-10-CM

## 2021-05-23 DIAGNOSIS — M6281 Muscle weakness (generalized): Secondary | ICD-10-CM

## 2021-05-23 DIAGNOSIS — D61818 Other pancytopenia: Secondary | ICD-10-CM | POA: Diagnosis not present

## 2021-05-23 DIAGNOSIS — H409 Unspecified glaucoma: Secondary | ICD-10-CM | POA: Diagnosis not present

## 2021-05-23 DIAGNOSIS — C44329 Squamous cell carcinoma of skin of other parts of face: Secondary | ICD-10-CM | POA: Diagnosis not present

## 2021-05-23 DIAGNOSIS — R279 Unspecified lack of coordination: Secondary | ICD-10-CM

## 2021-05-23 DIAGNOSIS — Z510 Encounter for antineoplastic radiation therapy: Secondary | ICD-10-CM | POA: Diagnosis not present

## 2021-05-23 DIAGNOSIS — R2689 Other abnormalities of gait and mobility: Secondary | ICD-10-CM

## 2021-05-23 NOTE — Progress Notes (Signed)
Oncology Nurse Navigator Documentation   To provide support, encouragement and care continuity, met with [name] [name] for his initial RT.  He was accompanied by his wife. I reviewed the 2-step treatment process, answered questions.  [name] [name] completed treatment without difficulty, denied questions/concerns. I reviewed the registration/arrival procedure for subsequent treatments. I encouraged them to call me with questions/concerns as tmts proceed.   Harlow Asa RN, BSN, OCN Head & Neck Oncology Nurse [place] at [facility] Phone # [phone]  Fax # [phone]

## 2021-05-23 NOTE — Therapy (Signed)
[facility] @ [facility] [place], [place], [address] Phone: [phone]   Fax:  [phone]  Physical Therapy Treatment  Patient Details  Name: [name] [name] MRN: [id number] Date of Birth: 11-30-1960 Referring Provider (PT): [name], MD   Encounter Date: 05/23/2021   PT End of Session - 05/23/21 1027     Visit Number 19    Date for PT Re-Evaluation 07/12/21    Authorization Type [employer]    Authorization - Visit Number 19    Authorization - Number of Visits 60    Progress Note Due on Visit 20    PT Start Time 1020    PT Stop Time 1100    PT Time Calculation (min) 40 min    Equipment Utilized During Treatment Gait belt    Activity Tolerance Patient tolerated treatment well    Behavior During Therapy [facility] for tasks assessed/performed             Past Medical History:  Diagnosis Date   Adenomatous colon polyp 10/1999   Anxiety    Arthritis    neck, yoga helps.   Cataract    Complex partial seizure ([place])    last seizure was 08-30-2018   Crohn's disease of small and large intestines ([place]) 1999   Depression    Diabetes mellitus    no meds at this time 10-24-17   Esophageal stricture    External hemorrhoids    GERD (gastroesophageal reflux disease)    Glaucoma    History of kidney stones    3 large stones still present   Hyperlipemia    Hypertension    resolved with weight loss    Inguinal hernia, bilateral 12/2019   Liver cirrhosis secondary to NASH ([place])    Migraines    Osteoporosis    PONV (postoperative nausea and vomiting)    PTSD (post-traumatic stress disorder)    Renal cyst, acquired, left 07/08/2017   Skin cancer    squamous cell multiple; [place]; followed every 3 months.   Sleep apnea    CPAP machine, uses nightly   Staphylococcus aureus bacteremia with sepsis ([place]) 05/28/2017   MRSA 2012    Past Surgical History:  Procedure Laterality Date   CATARACT EXTRACTION Bilateral     COLONOSCOPY     DEBRIDEMENT MANDIBLE N/A 12/07/2020   Procedure: DEBRIDEMENT OF MANDIBLE;  Surgeon: [name], DMD;  Location: [place];  Service: Oral Surgery;  Laterality: N/A;   ELBOW SURGERY  2012   elbow MRSA infection    EYE SURGERY     cataracts removed, /w IOL   GROSS TEETH DEBRIDEMENT N/A 12/29/2020   Procedure: MANDIBULAR  DEBRIDEMENT;  Surgeon: [name], DMD;  Location: [place];  Service: Dentistry;  Laterality: N/A;   INCISION AND DRAINAGE ABSCESS N/A 12/07/2020   Procedure: INCISION AND DRAINAGE MANDIBULAR ABSCESS;  Surgeon: [name], DMD;  Location: [facility];  Service: Oral Surgery;  Laterality: N/A;   INGUINAL HERNIA REPAIR Right 01/14/2020   Procedure: OPEN RIGHT HERNIA REPAIR INGUINAL WITH MESH;  Surgeon: [name], MD;  Location: [facility];  Service: General;  Laterality: Right;   INSERTION OF MESH N/A 07/01/2016   Procedure: INSERTION OF MESH;  Surgeon: [name], MD;  Location: [place];  Service: General;  Laterality: N/A;   IR URETERAL STENT LEFT NEW ACCESS W/O SEP NEPHROSTOMY CATH  02/02/2018   NEPHROLITHOTOMY Left 02/02/2018   Procedure: NEPHROLITHOTOMY PERCUTANEOUS;  Surgeon:  [name], MD;  Location: [facility];  Service: Urology;  Laterality: Left;   POLYPECTOMY     SCALP LACERATION REPAIR Right 10/16/2017   From fall/staples   SHOULDER ARTHROSCOPY Right 03/05/2018   Procedure: ARTHROSCOPY SHOULDER AND OPEN DISTAL CLAVICLE EXCISION;  Surgeon: [name], MD;  Location: [facility];  Service: Orthopedics;  Laterality: Right;   SHOULDER SURGERY     SINUS SURGERY WITH INSTATRAK     TEE WITHOUT CARDIOVERSION N/A 05/09/2017   Procedure: TRANSESOPHAGEAL ECHOCARDIOGRAM (TEE);  Surgeon: [name], MD;  [facility];  Service: Cardiovascular;  Laterality: N/A;   TOOTH EXTRACTION N/A 12/07/2020   Procedure: DENTAL EXTRACTIONS OF TEETH TWENTY TO THIRTY;  Surgeon: [name], DMD;  Location: [facility];  Service: Oral Surgery;  Laterality:  N/A;   TOOTH EXTRACTION N/A 12/29/2020   Procedure: DENTAL RESTORATION/EXTRACTIONS;  Surgeon: [name], DMD;  Location: [facility];  Service: Dentistry;  Laterality: N/A;   TRANSTHORACIC ECHOCARDIOGRAM  02/22/2011   EF 55-65%; increased pattern of LVH with mild conc hypertrophy, abnormal relaxation & increased filling pressure (grade 2 diastolic dysfunction); atrial septum thickened (lipomatous hypertrophy)   UMBILICAL HERNIA REPAIR N/A 07/01/2016   Procedure: UMBILICAL HERNIA REPAIR WITH MESH;  Surgeon: [name], MD;  Location: [facility];  Service: General;  Laterality: N/A;   UPPER GASTROINTESTINAL ENDOSCOPY     VASECTOMY      There were no vitals filed for this visit.   Subjective Assessment - 05/23/21 1024     Subjective Doing well - haven't been up too long.  I walked 3 miles after our session yesterday.    Pertinent History on disability, has memory problems, liver cirrhosis, Ch[place], osteoporosis with multiple fractures including C6, thrombocytopenia, gluacoma (loss of peripheral vision).  Has lost 75lb through walking but concerned about safety    Limitations Walking    How long can you walk comfortably? 2.5 miles with walking sticks    Diagnostic tests C6 fracture and ligamentum flavum tear - non-surgical    Patient Stated Goals work on coming down and up stairs, balance    Currently in Pain? No/denies                               [facility] Adult PT Treatment/Exercise - 05/23/21 0001       Exercises   Exercises Knee/Hip;Shoulder;Lumbar      Lumbar Exercises: Aerobic   Nustep L7 x 6' seat 10, PT present to discuss status and monitor      Lumbar Exercises: Standing   Other Standing Lumbar Exercises squat to 3lb d2 flexion clean and press x 8 Rt/Lt, using squat and trunk flex/rot with weight shift emphasis      Lumbar Exercises: Seated   Sit to Stand 10 reps    Sit to Stand Limitations 10lb      Knee/Hip Exercises: Standing   Walking with Sports Cord  resisted walking bwd x 8 reps, VC for smaller steps for control, 10lb UE pulleys      Knee/Hip Exercises: Seated   Long Arc Quad Strengthening;Both;15 reps;Weights    Long Arc Quad Weight 3 lbs.    Marching Strengthening;20 reps;Weights    Marching Limitations 3lb, tap knee with opp hand to knee tap                 Balance Exercises - 05/23/21 0001       Balance Exercises: Standing  Standing Eyes Opened Head turns;Solid surface   in stagger stance Lt and Rt x 8 head turns each   Retro Gait 2 reps   25 steps, close supervision, VC to take smaller steps for control   Cone Rotation Right turn;Left turn   weave through tight turns around cones 2x5 cones   Other Standing Exercises march taps to 8" stool, good balance and weight shifts today                  PT Short Term Goals - 04/05/21 0926       PT SHORT TERM GOAL #1   Title Patient will complete 3 minute walk test using LRAD with only supervision to indicate decreased fall risk.    Status Achieved      PT SHORT TERM GOAL #2   Title Pt will verbalize and demo safe strategies for stair pattern (step to for descending) and gait patterns/pace (slow down) without AD    Status Achieved      PT SHORT TERM GOAL #3   Title PT and Pt will explore gait with walker and make decisions about appropriateness of use for improved safety.    Baseline using walking sticks, has had 1 fall since starting PT    Status Achieved      PT SHORT TERM GOAL #4   Title Pt ind with initial HEP    Status Achieved               PT Long Term Goals - 05/15/21 1344       PT LONG TERM GOAL #1   Title Improve 5x sit to stand to </= 14 sec.    Baseline 9 sec    Status Achieved      PT LONG TERM GOAL #2   Title Pt will demo up/down stairs x 3 rounds (4 6 inch steps) using bil UEs and safe strategy to demo improved safety on stairs    Status Achieved      PT LONG TERM GOAL #3   Title Pt will demo safety/no LOB with community  ambulation for 6' walk test, curb up/down and grassy surface with supervision using least restrictive AD or no AD.    Baseline grassy surface needs more practice, making good progress with side stepping off curb    Status On-going      PT LONG TERM GOAL #4   Title Pt will report at least 50% more confidence in balance with daily tasks    Baseline 30-40%    Status On-going      PT LONG TERM GOAL #5   Title Ind with advanced HEP and understand how to safely progress    Status On-going      Additional Long Term Goals   Additional Long Term Goals Yes      PT LONG TERM GOAL #6   Title Improved DGI score to at least 18/24 to reduce fall risk with dynamic gait challenges    Baseline 8/24 first visit, 14/24 at re-eval 05/15/21    Status New                   Plan - 05/23/21 1027     Clinical Impression Statement Pt walked 3 miles with walking sticks after PT yesterday.  Session focused on hardening balance and coordination especially with head turns in stagger stance and with gait.  Pt demo'ing more willingness to turn head to Lt and demo'd no LOB in stagger stance  head turns today, improved from yesterday.  PT added resisted walking with 10lb pulley followed by retro gait with improved stability with VC to take smaller steps.  Pt is showing improved motor planning and coordination within each task.  Excellent weight shifts with standing march taps and squat to clean/press with diagonal reach today.  Continue along POC.    Comorbidities seizures, memory deficits, osteoporosis with Hx of multiple fractures    Stability/Clinical Decision Making Evolving/Moderate complexity    Rehab Potential Good    PT Frequency 2x / week    PT Duration 8 weeks    PT Treatment/Interventions ADLs/Self Care Home Management;Gait training;Stair training;Functional mobility training;Therapeutic activities;Therapeutic exercise;Balance training;Neuromuscular re-education;Manual techniques;Patient/family  education;Passive range of motion    PT Next Visit Plan 20th visit PN next time    PT Home Exercise Plan [id number]    Consulted and Agree with Plan of Care Patient             Patient will benefit from skilled therapeutic intervention in order to improve the following deficits and impairments:     Visit Diagnosis: Other abnormalities of gait and mobility  Muscle weakness (generalized)  Unspecified lack of coordination  Repeated falls     Problem List Patient Active Problem List   Diagnosis Date Noted   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 05/11/2021   Liver cirrhosis secondary to NASH ([facility])    GERD (gastroesophageal reflux disease)    Pancytopenia ([facility])    Osteomyelitis of jaw 12/29/2020   Acute osteomyelitis of mandible 12/26/2020   S/P inguinal hernia repair 01/14/2020   At high risk for falls    S/P shoulder surgery 03/05/2018   Excessive daytime sleepiness 02/18/2018   Crohn's disease of colon with rectal bleeding ([place]) 02/18/2018   Blood in stool 02/18/2018   Iron deficiency anemia due to chronic blood loss 02/18/2018   Iron deficiency anemia due to sideropenic dysphagia 02/18/2018   Renal calculus, left 02/02/2018   History of sepsis 12/16/2017   Renal cyst, acquired, left 07/08/2017   Frequent falls 07/08/2017   Renal lesion 05/28/2017   MSSA bacteremia 05/06/2017   Confusion 04/02/2017   Syncope 08/30/2016   Depression 04/30/2016   Memory loss 04/30/2016   OSA on CPAP 10/31/2015   Nonepileptic episode ([place]) 04/25/2015   Seizure ([place]) 04/04/2013   Abnormal EKG 03/12/2013   DOE (dyspnea on exertion) 03/12/2013   Complex partial seizure ([place]) 04/19/2012   DM2 (diabetes mellitus, type 2) ([place]) 09/25/2011   OTHER DYSPHAGIA 03/27/2009   Cough 11/10/2008   GERD 12/07/2007   COLONIC POLYPS, ADENOMATOUS, HX OF 12/07/2007   EXTERNAL HEMORRHOIDS 09/11/2007   CROHN'S DISEASE, LARGE AND SMALL INTESTINES 09/11/2007   OSTEOPOROSIS 09/11/2007    HYPERLIPIDEMIA NEC/NOS 02/27/2007   Essential hypertension 02/27/2007    Baruch Merl, PT 05/23/21 11:03 AM   [facility] @ [address] [place], [place], [address] Phone: [phone]   Fax:  [phone]  Name: [name] [name] MRN: [id number] Date of Birth: 1961-04-23

## 2021-05-24 ENCOUNTER — Ambulatory Visit
Admission: RE | Admit: 2021-05-24 | Discharge: 2021-05-24 | Disposition: A | Discharge status: Home / Self Care | Payer: Medicare Other | Source: Ambulatory Visit | Attending: Radiation Oncology | Admitting: Radiation Oncology

## 2021-05-24 ENCOUNTER — Other Ambulatory Visit: Payer: Self-pay

## 2021-05-24 DIAGNOSIS — C44329 Squamous cell carcinoma of skin of other parts of face: Secondary | ICD-10-CM | POA: Diagnosis not present

## 2021-05-24 DIAGNOSIS — M6281 Muscle weakness (generalized): Secondary | ICD-10-CM | POA: Insufficient documentation

## 2021-05-24 DIAGNOSIS — R279 Unspecified lack of coordination: Secondary | ICD-10-CM | POA: Diagnosis not present

## 2021-05-24 DIAGNOSIS — R296 Repeated falls: Secondary | ICD-10-CM | POA: Insufficient documentation

## 2021-05-24 DIAGNOSIS — R2689 Other abnormalities of gait and mobility: Secondary | ICD-10-CM | POA: Insufficient documentation

## 2021-05-25 ENCOUNTER — Ambulatory Visit
Admission: RE | Admit: 2021-05-25 | Discharge: 2021-05-25 | Disposition: A | Discharge status: Home / Self Care | Payer: Medicare Other | Source: Ambulatory Visit | Attending: Radiation Oncology | Admitting: Radiation Oncology

## 2021-05-25 ENCOUNTER — Other Ambulatory Visit: Payer: Self-pay

## 2021-05-25 DIAGNOSIS — C44329 Squamous cell carcinoma of skin of other parts of face: Secondary | ICD-10-CM | POA: Diagnosis not present

## 2021-05-28 ENCOUNTER — Ambulatory Visit
Admission: RE | Admit: 2021-05-28 | Discharge: 2021-05-28 | Disposition: A | Discharge status: Home / Self Care | Payer: Medicare Other | Source: Ambulatory Visit | Attending: Radiation Oncology | Admitting: Radiation Oncology

## 2021-05-28 ENCOUNTER — Other Ambulatory Visit: Payer: Self-pay

## 2021-05-28 DIAGNOSIS — C44329 Squamous cell carcinoma of skin of other parts of face: Secondary | ICD-10-CM

## 2021-05-28 MED ORDER — SONAFINE EX EMUL
1.0000 "application " | Freq: Two times a day (BID) | CUTANEOUS | Status: DC
  Administered 2021-05-28: 1 via TOPICAL

## 2021-05-28 NOTE — Progress Notes (Signed)
Pt here for patient teaching. Pt given Radiation and You booklet, Managing Acute Radiation Side Effects for Head and Neck Cancer handout, skin care instructions, and Sonafine.  Reviewed areas of pertinence such as fatigue, hair loss, mouth changes, skin changes, throat changes, headache, blurry vision, earaches, and taste changes. Pt able to give teach back of to pat skin, use unscented/gentle soap, and drink plenty of water, apply Sonafine bid, avoid applying anything to skin within 4 hours of treatment, and to use an electric razor if they must shave. Pt verbalizes understanding of information given and will contact nursing with any questions or concerns.     [email or web address]

## 2021-05-29 ENCOUNTER — Ambulatory Visit
Admission: RE | Admit: 2021-05-29 | Discharge: 2021-05-29 | Disposition: A | Discharge status: Home / Self Care | Payer: Medicare Other | Source: Ambulatory Visit | Attending: Radiation Oncology | Admitting: Radiation Oncology

## 2021-05-29 DIAGNOSIS — C44329 Squamous cell carcinoma of skin of other parts of face: Secondary | ICD-10-CM | POA: Diagnosis not present

## 2021-05-30 ENCOUNTER — Ambulatory Visit: Payer: Medicare Other | Admitting: Physical Therapy

## 2021-05-30 ENCOUNTER — Ambulatory Visit
Admission: RE | Admit: 2021-05-30 | Discharge: 2021-05-30 | Disposition: A | Discharge status: Home / Self Care | Payer: Medicare Other | Source: Ambulatory Visit | Attending: Radiation Oncology | Admitting: Radiation Oncology

## 2021-05-30 ENCOUNTER — Encounter: Payer: Self-pay | Admitting: Physical Therapy

## 2021-05-30 ENCOUNTER — Other Ambulatory Visit: Payer: Self-pay

## 2021-05-30 DIAGNOSIS — M6281 Muscle weakness (generalized): Secondary | ICD-10-CM | POA: Insufficient documentation

## 2021-05-30 DIAGNOSIS — R296 Repeated falls: Secondary | ICD-10-CM | POA: Insufficient documentation

## 2021-05-30 DIAGNOSIS — R2689 Other abnormalities of gait and mobility: Secondary | ICD-10-CM | POA: Insufficient documentation

## 2021-05-30 DIAGNOSIS — C44329 Squamous cell carcinoma of skin of other parts of face: Secondary | ICD-10-CM | POA: Diagnosis not present

## 2021-05-30 DIAGNOSIS — R279 Unspecified lack of coordination: Secondary | ICD-10-CM

## 2021-05-30 NOTE — Therapy (Signed)
[facility] [place] @ [place], [place], [address] Phone: [phone]   Fax:  [phone]  Physical Therapy Treatment  Patient Details  Name: [name] [name] MRN: [id number] Date of Birth: 1960/09/29 Referring Provider (PT): [name], MD   Progress Note Reporting Period 03/16/21 to 05/30/21  See note below for Objective Data and Assessment of Progress/Goals.      Encounter Date: 05/30/2021   PT End of Session - 05/30/21 0917     Visit Number 20    Date for PT Re-Evaluation 07/12/21    Authorization Type [employer]    Authorization - Visit Number 20    Authorization - Number of Visits 60    Progress Note Due on Visit 30    PT Start Time 0920    PT Stop Time 1007    PT Time Calculation (min) 47 min    Equipment Utilized During Treatment Gait belt    Activity Tolerance Patient tolerated treatment well    Behavior During Therapy WFL for tasks assessed/performed             Past Medical History:  Diagnosis Date   Adenomatous colon polyp 10/1999   Anxiety    Arthritis    neck, yoga helps.   Cataract    Complex partial seizure ([place])    last seizure was 08-30-2018   Crohn's disease of small and large intestines ([facility]) 1999   Depression    Diabetes mellitus    no meds at this time 10-24-17   Esophageal stricture    External hemorrhoids    GERD (gastroesophageal reflux disease)    Glaucoma    History of kidney stones    3 large stones still present   Hyperlipemia    Hypertension    resolved with weight loss    Inguinal hernia, bilateral 12/2019   Liver cirrhosis secondary to NASH ([place])    Migraines    Osteoporosis    PONV (postoperative nausea and vomiting)    PTSD (post-traumatic stress disorder)    Renal cyst, acquired, left 07/08/2017   Skin cancer    squamous cell multiple; [facility]; followed every 3 months.   Sleep apnea    CPAP machine, uses nightly   Staphylococcus aureus bacteremia with  sepsis ([place]) 05/28/2017   MRSA 2012    Past Surgical History:  Procedure Laterality Date   CATARACT EXTRACTION Bilateral    COLONOSCOPY     DEBRIDEMENT MANDIBLE N/A 12/07/2020   Procedure: DEBRIDEMENT OF MANDIBLE;  Surgeon: [name], DMD;  Location: [facility];  Service: Oral Surgery;  Laterality: N/A;   ELBOW SURGERY  2012   elbow MRSA infection    EYE SURGERY     cataracts removed, /w IOL   GROSS TEETH DEBRIDEMENT N/A 12/29/2020   Procedure: MANDIBULAR  DEBRIDEMENT;  Surgeon: [name], DMD;  Location: [facility];  Service: Dentistry;  Laterality: N/A;   INCISION AND DRAINAGE ABSCESS N/A 12/07/2020   Procedure: INCISION AND DRAINAGE MANDIBULAR ABSCESS;  Surgeon: [name], DMD;  Location: [facility];  Service: Oral Surgery;  Laterality: N/A;   INGUINAL HERNIA REPAIR Right 01/14/2020   Procedure: OPEN RIGHT HERNIA REPAIR INGUINAL WITH MESH;  Surgeon: [name], MD;  Location: [facility];  Service: General;  Laterality: Right;   INSERTION OF MESH N/A 07/01/2016   Procedure: INSERTION OF MESH;  Surgeon: [name], MD;  Location: [facility];  Service: General;  Laterality: N/A;   IR  URETERAL STENT LEFT NEW ACCESS W/O SEP NEPHROSTOMY CATH  02/02/2018   NEPHROLITHOTOMY Left 02/02/2018   Procedure: NEPHROLITHOTOMY PERCUTANEOUS;  Surgeon: [name], MD;  Location: [facility];  Service: Urology;  Laterality: Left;   POLYPECTOMY     SCALP LACERATION REPAIR Right 10/16/2017   From fall/staples   SHOULDER ARTHROSCOPY Right 03/05/2018   Procedure: ARTHROSCOPY SHOULDER AND OPEN DISTAL CLAVICLE EXCISION;  Surgeon: [name], MD;  Location: [facility];  Service: Orthopedics;  Laterality: Right;   SHOULDER SURGERY     SINUS SURGERY WITH INSTATRAK     TEE WITHOUT CARDIOVERSION N/A 05/09/2017   Procedure: TRANSESOPHAGEAL ECHOCARDIOGRAM (TEE);  Surgeon: [name], MD;  [facility];  Service: Cardiovascular;  Laterality: N/A;   TOOTH EXTRACTION N/A 12/07/2020   Procedure:  DENTAL EXTRACTIONS OF TEETH TWENTY TO THIRTY;  Surgeon: [name], DMD;  Location: [facility];  Service: Oral Surgery;  Laterality: N/A;   TOOTH EXTRACTION N/A 12/29/2020   Procedure: DENTAL RESTORATION/EXTRACTIONS;  Surgeon: [name], DMD;  Location: [facility];  Service: Dentistry;  Laterality: N/A;   TRANSTHORACIC ECHOCARDIOGRAM  02/22/2011   EF 55-65%; increased pattern of LVH with mild conc hypertrophy, abnormal relaxation & increased filling pressure (grade 2 diastolic dysfunction); atrial septum thickened (lipomatous hypertrophy)   UMBILICAL HERNIA REPAIR N/A 07/01/2016   Procedure: UMBILICAL HERNIA REPAIR WITH MESH;  Surgeon: [name], MD;  Location: [facility];  Service: General;  Laterality: N/A;   UPPER GASTROINTESTINAL ENDOSCOPY     VASECTOMY      There were no vitals filed for this visit.   Subjective Assessment - 05/30/21 0916     Subjective Pt started radiation last week.  I am tired all the time from it.  But I still think PT helps me and that I'll be able to exercise.  I haven't had any falls lately.    Pertinent History on disability, has memory problems, liver cirrhosis, Ch[place], osteoporosis with multiple fractures including C6, thrombocytopenia, gluacoma (loss of peripheral vision).  Has lost 75lb through walking but concerned about safety    Limitations Walking    How long can you walk comfortably? 2.5 miles with walking sticks    Diagnostic tests C6 fracture and ligamentum flavum tear - non-surgical    Patient Stated Goals work on coming down and up stairs, balance    Currently in Pain? No/denies                [facility] PT Assessment - 05/30/21 0001       Assessment   Medical Diagnosis R29.6 (ICD-10-CM) - Repeated falls    Referring Provider (PT) [name], MD      Ambulation/Gait   Gait Comments Gait outdoors on paved surfaces without walking poles no loss of balance      Standardized Balance Assessment   Standardized Balance Assessment Dynamic  Gait Index      Dynamic Gait Index   Level Surface Normal    Change in Gait Speed Normal    Gait with Horizontal Head Turns Moderate Impairment   with Lt head turn   Gait with Vertical Head Turns Moderate Impairment   stops walking to change head position   Gait and Pivot Turn Mild Impairment    Step Over Obstacle Mild Impairment    Step Around Obstacles Mild Impairment    Steps Mild Impairment    Total Score 16    DGI comment: 16/24      Timed Up and Go Test  TUG Normal TUG    Normal TUG (seconds) 10                           [facility] Adult PT Treatment/Exercise - 05/30/21 0001       Neuro Re-ed    Neuro Re-ed Details  standing cross punch 3lb with weight shifts x 20 reps, blue plyo ball diagonal lift with weight shift and trunk rotation x 10 each      Exercises   Exercises Shoulder;Lumbar;Knee/Hip      Lumbar Exercises: Aerobic   Nustep L4 x 10' seat 11, PT present to discuss progress      Knee/Hip Exercises: Standing   Walking with Sports Cord resisted walking bwd x 10 reps, green band    Gait Training farmer's carry 80' 10lb 1x each UE      Knee/Hip Exercises: Seated   Sit to Sand 5 reps;without UE support   holding 10lb                Balance Exercises - 05/30/21 0001       Balance Exercises: Standing   Stepping Strategy Anterior;Lateral;Other reps (comment)   diagonals, green loop band around ankles, w/ ipsilateral reach and weight shift, hold 5" x 8 each direction w/ Lt/Rt feet   Gait with Head Turns 4 reps;Forward   VC to focus on grounding feet in middle path vs drift with head turn   Sidestepping Cognitive challenge   to colored discs on floor, green loop resistance band, gait belt   Cone Rotation Right turn;Left turn   weave through tight turns around cones 2x5 cones                 PT Short Term Goals - 04/05/21 0926       PT SHORT TERM GOAL #1   Title Patient will complete 3 minute walk test using LRAD with only  supervision to indicate decreased fall risk.    Status Achieved      PT SHORT TERM GOAL #2   Title Pt will verbalize and demo safe strategies for stair pattern (step to for descending) and gait patterns/pace (slow down) without AD    Status Achieved      PT SHORT TERM GOAL #3   Title PT and Pt will explore gait with walker and make decisions about appropriateness of use for improved safety.    Baseline using walking sticks, has had 1 fall since starting PT    Status Achieved      PT SHORT TERM GOAL #4   Title Pt ind with initial HEP    Status Achieved               PT Long Term Goals - 05/30/21 0917       PT LONG TERM GOAL #1   Title Improve 5x sit to stand to </= 14 sec.    Baseline 9 sec    Status Achieved      PT LONG TERM GOAL #2   Title Pt will demo up/down stairs x 3 rounds (4 6 inch steps) using bil UEs and safe strategy to demo improved safety on stairs    Baseline step to pattern, no handrails, 1 LOB having to put hand on the wall    Status Achieved      PT LONG TERM GOAL #3   Title Pt will demo safety/no LOB with community ambulation for 6' walk test, curb up/down  and grassy surface with supervision using least restrictive AD or no AD.    Baseline grassy surface needs more practice, making good progress with side stepping off curb    Status On-going      PT LONG TERM GOAL #4   Title Pt will report at least 50% more confidence in balance with daily tasks    Baseline 40%    Status On-going      PT LONG TERM GOAL #5   Title Ind with advanced HEP and understand how to safely progress    Baseline -    Status On-going      PT LONG TERM GOAL #6   Title Improved DGI score to at least 18/24 to reduce fall risk with dynamic gait challenges    Baseline 8/24 first visit, 14/24 at re-eval 05/15/21    Status On-going                   Plan - 05/30/21 1021     Clinical Impression Statement Pt has undergone 4 radiation sessions since last visit and  arrived with report of "being tired all the time."  However, he demo'd excellent participation today and even showed improvement in DGI from 14/24 to 16/24, with improved gait with head turns (VC to concentrate on foot placement > turning his head cue) and stepping around obstacles.  Pt is able to ind perform cross body UE diagonal and horizontal weighted tasks with good Lt/Rt weight shifts without LOB with supervision level of safety only.  PT added resistance band around ankles and cognitive challenge of sidestepping to varied colored discs on floor.  Pt's retro gait improves with resisted backward walking using bil UE tband to slow him down and force more motor control.  Pt had some difficulty coordinating VC for alternating diagonal step and reaches between Rt and Lt.  Pt is making excellent progress and has had no falls in several weeks.  He has a complex PMH including new status of radiation.  PT closely monitoring for tolerance of selected treatment interventions.    Comorbidities seizures, memory deficits, osteoporosis with Hx of multiple fractures    PT Next Visit Plan continue alt Lt/Rt tasks, cross body work with weight shifts, resisted retro walking green band, standing heel/toe raises    PT Home Exercise Plan [id number]    Consulted and Agree with Plan of Care Patient             Patient will benefit from skilled therapeutic intervention in order to improve the following deficits and impairments:     Visit Diagnosis: Other abnormalities of gait and mobility  Muscle weakness (generalized)  Unspecified lack of coordination     Problem List Patient Active Problem List   Diagnosis Date Noted   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 05/11/2021   Liver cirrhosis secondary to NASH ([facility])    GERD (gastroesophageal reflux disease)    Pancytopenia ([facility])    Osteomyelitis of jaw 12/29/2020   Acute osteomyelitis of mandible 12/26/2020   S/P inguinal hernia repair 01/14/2020    At high risk for falls    S/P shoulder surgery 03/05/2018   Excessive daytime sleepiness 02/18/2018   Crohn's disease of colon with rectal bleeding ([place]) 02/18/2018   Blood in stool 02/18/2018   Iron deficiency anemia due to chronic blood loss 02/18/2018   Iron deficiency anemia due to sideropenic dysphagia 02/18/2018   Renal calculus, left 02/02/2018   History of sepsis 12/16/2017  Renal cyst, acquired, left 07/08/2017   Frequent falls 07/08/2017   Renal lesion 05/28/2017   MSSA bacteremia 05/06/2017   Confusion 04/02/2017   Syncope 08/30/2016   Depression 04/30/2016   Memory loss 04/30/2016   OSA on CPAP 10/31/2015   Nonepileptic episode ([place]) 04/25/2015   Seizure ([place]) 04/04/2013   Abnormal EKG 03/12/2013   DOE (dyspnea on exertion) 03/12/2013   Complex partial seizure ([place]) 04/19/2012   DM2 (diabetes mellitus, type 2) ([place]) 09/25/2011   OTHER DYSPHAGIA 03/27/2009   Cough 11/10/2008   GERD 12/07/2007   COLONIC POLYPS, ADENOMATOUS, HX OF 12/07/2007   EXTERNAL HEMORRHOIDS 09/11/2007   CROHN'S DISEASE, LARGE AND SMALL INTESTINES 09/11/2007   OSTEOPOROSIS 09/11/2007   HYPERLIPIDEMIA NEC/NOS 02/27/2007   Essential hypertension 02/27/2007    Baruch Merl, PT 05/30/21 10:32 AM   [facility] @ [place], [place], [address] Phone: [phone]   Fax:  [phone]  Name: [name] [name] MRN: [id number] Date of Birth: 10-23-1960

## 2021-05-31 ENCOUNTER — Other Ambulatory Visit: Payer: Self-pay

## 2021-05-31 ENCOUNTER — Encounter: Payer: Medicare Other | Admitting: Physical Therapy

## 2021-05-31 ENCOUNTER — Encounter: Payer: Medicare Other | Admitting: Dietician

## 2021-05-31 ENCOUNTER — Ambulatory Visit
Admission: RE | Admit: 2021-05-31 | Discharge: 2021-05-31 | Disposition: A | Discharge status: Home / Self Care | Payer: Medicare Other | Source: Ambulatory Visit | Attending: Radiation Oncology | Admitting: Radiation Oncology

## 2021-05-31 DIAGNOSIS — C44329 Squamous cell carcinoma of skin of other parts of face: Secondary | ICD-10-CM | POA: Diagnosis not present

## 2021-06-01 ENCOUNTER — Ambulatory Visit
Admission: RE | Admit: 2021-06-01 | Discharge: 2021-06-01 | Disposition: A | Discharge status: Home / Self Care | Payer: Medicare Other | Source: Ambulatory Visit | Attending: Radiation Oncology | Admitting: Radiation Oncology

## 2021-06-01 DIAGNOSIS — C44329 Squamous cell carcinoma of skin of other parts of face: Secondary | ICD-10-CM | POA: Diagnosis not present

## 2021-06-04 ENCOUNTER — Other Ambulatory Visit (INDEPENDENT_AMBULATORY_CARE_PROVIDER_SITE_OTHER): Payer: Medicare Other

## 2021-06-04 ENCOUNTER — Ambulatory Visit (INDEPENDENT_AMBULATORY_CARE_PROVIDER_SITE_OTHER): Payer: Medicare Other | Admitting: Gastroenterology

## 2021-06-04 ENCOUNTER — Encounter: Payer: Self-pay | Admitting: Gastroenterology

## 2021-06-04 ENCOUNTER — Ambulatory Visit
Admission: RE | Admit: 2021-06-04 | Discharge: 2021-06-04 | Disposition: A | Discharge status: Home / Self Care | Payer: Medicare Other | Source: Ambulatory Visit | Attending: Radiation Oncology | Admitting: Radiation Oncology

## 2021-06-04 ENCOUNTER — Other Ambulatory Visit: Payer: Self-pay

## 2021-06-04 VITALS — BP 100/68 | HR 72 | Ht 69.0 in | Wt 175.1 lb

## 2021-06-04 DIAGNOSIS — K219 Gastro-esophageal reflux disease without esophagitis: Secondary | ICD-10-CM

## 2021-06-04 DIAGNOSIS — K50818 Crohn's disease of both small and large intestine with other complication: Secondary | ICD-10-CM

## 2021-06-04 DIAGNOSIS — K7460 Unspecified cirrhosis of liver: Secondary | ICD-10-CM | POA: Diagnosis not present

## 2021-06-04 DIAGNOSIS — C44329 Squamous cell carcinoma of skin of other parts of face: Secondary | ICD-10-CM | POA: Diagnosis not present

## 2021-06-04 LAB — COMPREHENSIVE METABOLIC PANEL
ALT: 23 U/L (ref 0–53)
AST: 28 U/L (ref 0–37)
Albumin: 4.4 g/dL (ref 3.5–5.2)
Alkaline Phosphatase: 99 U/L (ref 39–117)
BUN: 23 mg/dL (ref 6–23)
CO2: 25 mEq/L (ref 19–32)
Calcium: 9.7 mg/dL (ref 8.4–10.5)
Chloride: 107 mEq/L (ref 96–112)
Creatinine, Ser: 1.02 mg/dL (ref 0.40–1.50)
GFR: 80.45 mL/min (ref >60.00)
Glucose, Bld: 90 mg/dL (ref 70–99)
Potassium: 4.1 mEq/L (ref 3.5–5.1)
Sodium: 142 mEq/L (ref 135–145)
Total Bilirubin: 2 mg/dL — ABNORMAL HIGH (ref 0.2–1.2)
Total Protein: 6.9 g/dL (ref 6.0–8.3)

## 2021-06-04 LAB — VITAMIN B12: Vitamin B-12: 723 pg/mL (ref 211–911)

## 2021-06-04 LAB — CBC WITH DIFFERENTIAL/PLATELET
Basophils Absolute: 0 10*3/uL (ref 0.0–0.1)
Basophils Relative: 0.7 % (ref 0.0–3.0)
Eosinophils Absolute: 0.2 10*3/uL (ref 0.0–0.7)
Eosinophils Relative: 3.9 % (ref 0.0–5.0)
HCT: 31.7 % — ABNORMAL LOW (ref 39.0–52.0)
Hemoglobin: 11.1 g/dL — ABNORMAL LOW (ref 13.0–17.0)
Lymphocytes Relative: 15 % (ref 12.0–46.0)
Lymphs Abs: 0.6 10*3/uL — ABNORMAL LOW (ref 0.7–4.0)
MCHC: 35.2 g/dL (ref 30.0–36.0)
MCV: 107.3 fl — ABNORMAL HIGH (ref 78.0–100.0)
Monocytes Absolute: 0.4 10*3/uL (ref 0.1–1.0)
Monocytes Relative: 9.9 % (ref 3.0–12.0)
Neutro Abs: 2.7 10*3/uL (ref 1.4–7.7)
Neutrophils Relative %: 70.5 % (ref 43.0–77.0)
Platelets: 111 10*3/uL — ABNORMAL LOW (ref 150.0–400.0)
RBC: 2.95 Mil/uL — ABNORMAL LOW (ref 4.22–5.81)
RDW: 17.7 % — ABNORMAL HIGH (ref 11.5–15.5)
WBC: 3.9 10*3/uL — ABNORMAL LOW (ref 4.0–10.5)

## 2021-06-04 LAB — PROTIME-INR
INR: 1.2 ratio — ABNORMAL HIGH (ref 0.8–1.0)
Prothrombin Time: 13.5 s — ABNORMAL HIGH (ref 9.6–13.1)

## 2021-06-04 LAB — AMMONIA: Ammonia: 51 umol/L — ABNORMAL HIGH (ref 11–35)

## 2021-06-04 MED ORDER — MERCAPTOPURINE 50 MG PO TABS: ORAL_TABLET | ORAL | 3 refills | Status: DC

## 2021-06-04 MED ORDER — METHSCOPOLAMINE BROMIDE 5 MG PO TABS: 5.0000 mg | ORAL_TABLET | Freq: Two times a day (BID) | ORAL | 3 refills | Status: DC

## 2021-06-04 MED ORDER — RIFAXIMIN 550 MG PO TABS: 550.0000 mg | ORAL_TABLET | Freq: Two times a day (BID) | ORAL | 3 refills | Status: AC

## 2021-06-04 MED ORDER — ESOMEPRAZOLE MAGNESIUM 40 MG PO CPDR: 40.0000 mg | DELAYED_RELEASE_CAPSULE | Freq: Two times a day (BID) | ORAL | 3 refills | Status: DC

## 2021-06-04 NOTE — Patient Instructions (Signed)
Your provider has requested that you go to the basement level for lab work before leaving today. Press "B" on the elevator. The lab is located at the first door on the left as you exit the elevator.  We have sent the following prescriptions to your mail in pharmacy: xifaxan, pamine, mercaptopurine, and Nexium.   If you have not heard from your mail in pharmacy within 1 week or if you have not received your medication in the mail, please contact us at [phone] so we may find out why.  Due to recent changes in healthcare laws, you may see the results of your imaging and laboratory studies on MyChart before your provider has had a chance to review them.  We understand that in some cases there may be results that are confusing or concerning to you. Not all laboratory results come back in the same time frame and the provider may be waiting for multiple results in order to interpret others.  Please give [place] 48 hours in order for your provider to thoroughly review all the results before contacting the office for clarification of your results.   The [facility] providers would like to encourage you to use [facility] to communicate with providers for non-urgent requests or questions.  Due to long hold times on the telephone, sending your provider a message by [facility] may be a faster and more efficient way to get a response.  Please allow 48 business hours for a response.  Please remember that this is for non-urgent requests.   Thank you for choosing me and [facility].  Pricilla Riffle. Dagoberto Ligas., MD., [facility]

## 2021-06-04 NOTE — Progress Notes (Signed)
    History of Present Illness: This is a 60 year old male returning for follow-up of Crohn's ileocolitis and cirrhosis.  He is accompanied by his wife who provides a substantial portion of the history.  He states his diarrhea improved after Augmentin was discontinued.  He is currently having 3-3 to 5 loose bowel movements a day which is at his baseline.  He is undergoing radiation treatments to his right cheek for an invasive skin squamous cell carcinoma.   Current Medications, Allergies, Past Medical History, Past Surgical History, Family History and Social History were reviewed in [employer] record.   Physical Exam: General: Well developed, well nourished, no acute distress Head: Normocephalic and atraumatic Eyes: Sclerae anicteric, EOMI Ears: Normal auditory acuity Mouth: Not examined, mask on during Covid-19 pandemic Lungs: Clear throughout to auscultation Heart: Regular rate and rhythm; no murmurs, rubs or bruits Abdomen: Soft, non tender and non distended. No masses, hepatosplenomegaly or hernias noted. Normal Bowel sounds Rectal: Not done Musculoskeletal: Symmetrical with no gross deformities  Pulses:  Normal pulses noted Extremities: No clubbing, cyanosis, edema or deformities noted Neurological: Alert oriented x 4, grossly nonfocal Psychological:  Alert and cooperative. Normal mood and affect   Assessment and Recommendations:  Crohn's ileocollitis.  Continue 6-MP 100 mg daily.  Discontinue Lialda.  Avoid foods and beverages that exacerbate diarrhea.  Continue Pamine Forte 5 mg p.o. twice daily.  CBC, CMP, PT/INR, B12, ammonia today. REV in 2 months.  Cirrhosis, presumed NASH, with HE.  Continue Xifaxan 550 mg p.o. twice daily.  Defer EGD for varices surveillance until he completes radiation therapy and then we will reassess timing.  Defer RUQ [place] until he completes his radiation treatments. GERD.  Follow antireflux measures.  Continue Nexium 40 mg p.o.  twice daily. Personal history of adenomatous colon polyps.  Recent right mandible osteomyelitis, resolved.

## 2021-06-05 ENCOUNTER — Encounter: Payer: Medicare Other | Admitting: Physical Therapy

## 2021-06-05 ENCOUNTER — Ambulatory Visit
Admission: RE | Admit: 2021-06-05 | Discharge: 2021-06-05 | Disposition: A | Discharge status: Home / Self Care | Payer: Medicare Other | Source: Ambulatory Visit | Attending: Radiation Oncology | Admitting: Radiation Oncology

## 2021-06-05 ENCOUNTER — Other Ambulatory Visit: Payer: Self-pay

## 2021-06-05 DIAGNOSIS — C44329 Squamous cell carcinoma of skin of other parts of face: Secondary | ICD-10-CM | POA: Diagnosis not present

## 2021-06-06 ENCOUNTER — Encounter: Payer: Self-pay | Admitting: Physical Therapy

## 2021-06-06 ENCOUNTER — Ambulatory Visit: Payer: Medicare Other | Admitting: Physical Therapy

## 2021-06-06 ENCOUNTER — Ambulatory Visit
Admission: RE | Admit: 2021-06-06 | Discharge: 2021-06-06 | Disposition: A | Discharge status: Home / Self Care | Payer: Medicare Other | Source: Ambulatory Visit | Attending: Radiation Oncology | Admitting: Radiation Oncology

## 2021-06-06 DIAGNOSIS — L570 Actinic keratosis: Secondary | ICD-10-CM | POA: Diagnosis not present

## 2021-06-06 DIAGNOSIS — C4402 Squamous cell carcinoma of skin of lip: Secondary | ICD-10-CM | POA: Diagnosis not present

## 2021-06-06 DIAGNOSIS — Z85828 Personal history of other malignant neoplasm of skin: Secondary | ICD-10-CM | POA: Diagnosis not present

## 2021-06-06 DIAGNOSIS — R296 Repeated falls: Secondary | ICD-10-CM

## 2021-06-06 DIAGNOSIS — C44329 Squamous cell carcinoma of skin of other parts of face: Secondary | ICD-10-CM | POA: Diagnosis not present

## 2021-06-06 DIAGNOSIS — R279 Unspecified lack of coordination: Secondary | ICD-10-CM

## 2021-06-06 DIAGNOSIS — C44229 Squamous cell carcinoma of skin of left ear and external auricular canal: Secondary | ICD-10-CM | POA: Diagnosis not present

## 2021-06-06 DIAGNOSIS — R2689 Other abnormalities of gait and mobility: Secondary | ICD-10-CM

## 2021-06-06 DIAGNOSIS — M6281 Muscle weakness (generalized): Secondary | ICD-10-CM

## 2021-06-06 DIAGNOSIS — C44299 Other specified malignant neoplasm of skin of left ear and external auricular canal: Secondary | ICD-10-CM | POA: Diagnosis not present

## 2021-06-06 NOTE — Therapy (Signed)
[facility] @ [address] [place], [place], [address] Phone: [phone]   Fax:  [phone]  Physical Therapy Treatment  Patient Details  Name: [name] [name] MRN: [id number] Date of Birth: 07/20/1960 Referring Provider (PT): [name], MD   Encounter Date: 06/06/2021   PT End of Session - 06/06/21 0939     Visit Number 21    Date for PT Re-Evaluation 07/12/21    Authorization Type UHC Medicare    Authorization - Visit Number 21    Authorization - Number of Visits 60    Progress Note Due on Visit 30    PT Start Time 0935    PT Stop Time 1015    PT Time Calculation (min) 40 min    Activity Tolerance Patient tolerated treatment well    Behavior During Therapy [facility] for tasks assessed/performed             Past Medical History:  Diagnosis Date   Adenomatous colon polyp 10/1999   Anxiety    Arthritis    neck, yoga helps.   Cataract    Complex partial seizure ([place])    last seizure was 08-30-2018   Crohn's disease of small and large intestines ([place]) 1999   Depression    Diabetes mellitus    no meds at this time 10-24-17   Esophageal stricture    External hemorrhoids    GERD (gastroesophageal reflux disease)    Glaucoma    History of kidney stones    3 large stones still present   Hyperlipemia    Hypertension    resolved with weight loss    Inguinal hernia, bilateral 12/2019   Liver cirrhosis secondary to NASH ([place])    Migraines    Osteoporosis    PONV (postoperative nausea and vomiting)    PTSD (post-traumatic stress disorder)    Renal cyst, acquired, left 07/08/2017   Skin cancer    squamous cell multiple; [place]; followed every 3 months.   Sleep apnea    CPAP machine, uses nightly   Staphylococcus aureus bacteremia with sepsis ([place]) 05/28/2017   MRSA 2012    Past Surgical History:  Procedure Laterality Date   CATARACT EXTRACTION Bilateral    COLONOSCOPY     DEBRIDEMENT MANDIBLE N/A  12/07/2020   Procedure: DEBRIDEMENT OF MANDIBLE;  Surgeon: [name], DMD;  Location: [facility] [place];  Service: Oral Surgery;  Laterality: N/A;   ELBOW SURGERY  2012   elbow MRSA infection    EYE SURGERY     cataracts removed, /w IOL   GROSS TEETH DEBRIDEMENT N/A 12/29/2020   Procedure: MANDIBULAR  DEBRIDEMENT;  Surgeon: [name], DMD;  Location: [place];  Service: Dentistry;  Laterality: N/A;   INCISION AND DRAINAGE ABSCESS N/A 12/07/2020   Procedure: INCISION AND DRAINAGE MANDIBULAR ABSCESS;  Surgeon: [name], DMD;  Location: [facility];  Service: Oral Surgery;  Laterality: N/A;   INGUINAL HERNIA REPAIR Right 01/14/2020   Procedure: OPEN RIGHT HERNIA REPAIR INGUINAL WITH MESH;  Surgeon: [name], MD;  [facility];  Service: General;  Laterality: Right;   INSERTION OF MESH N/A 07/01/2016   Procedure: INSERTION OF MESH;  Surgeon: [name], MD;  Location: [place];  Service: General;  Laterality: N/A;   IR URETERAL STENT LEFT NEW ACCESS W/O SEP NEPHROSTOMY CATH  02/02/2018   NEPHROLITHOTOMY Left 02/02/2018   Procedure: NEPHROLITHOTOMY PERCUTANEOUS;  Surgeon: [name], MD;  [facility];  Service:  Urology;  Laterality: Left;   POLYPECTOMY     SCALP LACERATION REPAIR Right 10/16/2017   From fall/staples   SHOULDER ARTHROSCOPY Right 03/05/2018   Procedure: ARTHROSCOPY SHOULDER AND OPEN DISTAL CLAVICLE EXCISION;  Surgeon: [name], MD;  Location: [place];  Service: Orthopedics;  Laterality: Right;   SHOULDER SURGERY     SINUS SURGERY WITH INSTATRAK     TEE WITHOUT CARDIOVERSION N/A 05/09/2017   Procedure: TRANSESOPHAGEAL ECHOCARDIOGRAM (TEE);  Surgeon: [name], MD;  [facility];  Service: Cardiovascular;  Laterality: N/A;   TOOTH EXTRACTION N/A 12/07/2020   Procedure: DENTAL EXTRACTIONS OF TEETH TWENTY TO THIRTY;  Surgeon: [name], DMD;  Location: [place];  Service: Oral Surgery;  Laterality: N/A;   TOOTH EXTRACTION N/A 12/29/2020    Procedure: DENTAL RESTORATION/EXTRACTIONS;  Surgeon: [name], DMD;  Location: [place];  Service: Dentistry;  Laterality: N/A;   TRANSTHORACIC ECHOCARDIOGRAM  02/22/2011   EF 55-65%; increased pattern of LVH with mild conc hypertrophy, abnormal relaxation & increased filling pressure (grade 2 diastolic dysfunction); atrial septum thickened (lipomatous hypertrophy)   UMBILICAL HERNIA REPAIR N/A 07/01/2016   Procedure: UMBILICAL HERNIA REPAIR WITH MESH;  Surgeon: [name], MD;  Location: [facility];  Service: General;  Laterality: N/A;   UPPER GASTROINTESTINAL ENDOSCOPY     VASECTOMY      There were no vitals filed for this visit.   Subjective Assessment - 06/06/21 0938     Subjective I am dizzy today and radiation has me very tired.  I have pressure in my eye.    Pertinent History on disability, has memory problems, liver cirrhosis, Chrons, osteoporosis with multiple fractures including C6, thrombocytopenia, gluacoma (loss of peripheral vision).  Has lost 75lb through walking but concerned about safety    Limitations Walking    How long can you walk comfortably? 2.5 miles with walking sticks    Diagnostic tests C6 fracture and ligamentum flavum tear - non-surgical    Patient Stated Goals work on coming down and up stairs, balance    Currently in Pain? No/denies                               [facility] Adult PT Treatment/Exercise - 06/06/21 0001       Neuro Re-ed    Neuro Re-ed Details  standing cross punch 3lb with weight shifts x 20 reps, blue plyo ball diagonal lift with weight shift and trunk rotation x 10 each      Exercises   Exercises Shoulder;Lumbar;Knee/Hip      Lumbar Exercises: Aerobic   Nustep L6 x 6'      Knee/Hip Exercises: Standing   Walking with Sports Cord resisted walking bwd x 10 reps, blue band    Gait Training farmer's carry 160' 10lb 1x each UE      Knee/Hip Exercises: Seated   Knee/Hip Flexion 10lb on thigh, chair + pad, x 10 Rt/Lt  each    Sit to Sand 10 reps;without UE support   hold 10lb                Balance Exercises - 06/06/21 0001       Balance Exercises: Standing   Stepping Strategy Lateral   with cognitive challenge for VC to change direction at varied intervals, x 2'   Gait with Head Turns 4 reps;Forward   4 laps length of long hall in clinic, VC to focus on  grounding feet in middle path vs drift with head turn   Sidestepping Cognitive challenge   see stepping strategy                 PT Short Term Goals - 04/05/21 0926       PT SHORT TERM GOAL #1   Title Patient will complete 3 minute walk test using LRAD with only supervision to indicate decreased fall risk.    Status Achieved      PT SHORT TERM GOAL #2   Title Pt will verbalize and demo safe strategies for stair pattern (step to for descending) and gait patterns/pace (slow down) without AD    Status Achieved      PT SHORT TERM GOAL #3   Title PT and Pt will explore gait with walker and make decisions about appropriateness of use for improved safety.    Baseline using walking sticks, has had 1 fall since starting PT    Status Achieved      PT SHORT TERM GOAL #4   Title Pt ind with initial HEP    Status Achieved               PT Long Term Goals - 05/30/21 0917       PT LONG TERM GOAL #1   Title Improve 5x sit to stand to </= 14 sec.    Baseline 9 sec    Status Achieved      PT LONG TERM GOAL #2   Title Pt will demo up/down stairs x 3 rounds (4 6 inch steps) using bil UEs and safe strategy to demo improved safety on stairs    Baseline step to pattern, no handrails, 1 LOB having to put hand on the wall    Status Achieved      PT LONG TERM GOAL #3   Title Pt will demo safety/no LOB with community ambulation for 6' walk test, curb up/down and grassy surface with supervision using least restrictive AD or no AD.    Baseline grassy surface needs more practice, making good progress with side stepping off curb    Status  On-going      PT LONG TERM GOAL #4   Title Pt will report at least 50% more confidence in balance with daily tasks    Baseline 40%    Status On-going      PT LONG TERM GOAL #5   Title Ind with advanced HEP and understand how to safely progress    Baseline -    Status On-going      PT LONG TERM GOAL #6   Title Improved DGI score to at least 18/24 to reduce fall risk with dynamic gait challenges    Baseline 8/24 first visit, 14/24 at re-eval 05/15/21    Status On-going                   Plan - 06/06/21 1147     Clinical Impression Statement Pt continues to be fatigued from radiation treatment but was able to fully participate in session today.  He demos much improved gait pattern with head turns, deviating only very slightly to ipsilateral side but not losing balance.  He is able to perform retro resisted walking with blue band (advanced from green today) and sidestepping with resistance loop band (green).  He has not fallen in many weeks.  His stability with weight shifts with weighted upper extremity tasks across the body is much improved.  Continue along POC  Personal Factors and Comorbidities Comorbidity 1;Comorbidity 2;Comorbidity 3+;Behavior Pattern;Time since onset of injury/illness/exacerbation    Comorbidities seizures, memory deficits, osteoporosis with Hx of multiple fractures    Examination-Activity Limitations Locomotion Level;Transfers;Stand;Bend    Examination-Participation Restrictions Community Activity;Driving    PT Frequency 2x / week    PT Duration 8 weeks    PT Treatment/Interventions ADLs/Self Care Home Management;Gait training;Stair training;Functional mobility training;Therapeutic activities;Therapeutic exercise;Balance training;Neuromuscular re-education;Manual techniques;Patient/family education;Passive range of motion    PT Next Visit Plan continue alt Lt/Rt tasks, cross body work with weight shifts, resisted retro walking green band, standing heel/toe  raises    PT Home Exercise Plan [id number]    Consulted and Agree with Plan of Care Patient             Patient will benefit from skilled therapeutic intervention in order to improve the following deficits and impairments:     Visit Diagnosis: Other abnormalities of gait and mobility  Muscle weakness (generalized)  Unspecified lack of coordination  Repeated falls     Problem List Patient Active Problem List   Diagnosis Date Noted   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 05/11/2021   Liver cirrhosis secondary to NASH ([facility])    GERD (gastroesophageal reflux disease)    Pancytopenia ([facility])    Osteomyelitis of jaw 12/29/2020   Acute osteomyelitis of mandible 12/26/2020   S/P inguinal hernia repair 01/14/2020   At high risk for falls    S/P shoulder surgery 03/05/2018   Excessive daytime sleepiness 02/18/2018   Crohn's disease of colon with rectal bleeding ([place]) 02/18/2018   Blood in stool 02/18/2018   Iron deficiency anemia due to chronic blood loss 02/18/2018   Iron deficiency anemia due to sideropenic dysphagia 02/18/2018   Renal calculus, left 02/02/2018   History of sepsis 12/16/2017   Renal cyst, acquired, left 07/08/2017   Frequent falls 07/08/2017   Renal lesion 05/28/2017   MSSA bacteremia 05/06/2017   Confusion 04/02/2017   Syncope 08/30/2016   Depression 04/30/2016   Memory loss 04/30/2016   OSA on CPAP 10/31/2015   Nonepileptic episode ([place]) 04/25/2015   Seizure ([place]) 04/04/2013   Abnormal EKG 03/12/2013   DOE (dyspnea on exertion) 03/12/2013   Complex partial seizure ([place]) 04/19/2012   DM2 (diabetes mellitus, type 2) ([place]) 09/25/2011   OTHER DYSPHAGIA 03/27/2009   Cough 11/10/2008   GERD 12/07/2007   COLONIC POLYPS, ADENOMATOUS, HX OF 12/07/2007   EXTERNAL HEMORRHOIDS 09/11/2007   CROHN'S DISEASE, LARGE AND SMALL INTESTINES 09/11/2007   OSTEOPOROSIS 09/11/2007   HYPERLIPIDEMIA NEC/NOS 02/27/2007   Essential hypertension 02/27/2007     Baruch Merl, PT 06/06/21 1:35 PM  [facility] @ [address] [place], [place], [address] Phone: [phone]   Fax:  [phone]  Name: FLORIAN CHAUCA MRN: [id number] Date of Birth: April 24, 1961

## 2021-06-07 ENCOUNTER — Other Ambulatory Visit: Payer: Self-pay

## 2021-06-07 ENCOUNTER — Encounter: Payer: Self-pay | Admitting: Physical Therapy

## 2021-06-07 ENCOUNTER — Ambulatory Visit: Payer: Medicare Other | Admitting: Physical Therapy

## 2021-06-07 ENCOUNTER — Ambulatory Visit
Admission: RE | Admit: 2021-06-07 | Discharge: 2021-06-07 | Disposition: A | Discharge status: Home / Self Care | Payer: Medicare Other | Source: Ambulatory Visit | Attending: Radiation Oncology | Admitting: Radiation Oncology

## 2021-06-07 DIAGNOSIS — M6281 Muscle weakness (generalized): Secondary | ICD-10-CM

## 2021-06-07 DIAGNOSIS — R2689 Other abnormalities of gait and mobility: Secondary | ICD-10-CM

## 2021-06-07 DIAGNOSIS — R279 Unspecified lack of coordination: Secondary | ICD-10-CM

## 2021-06-07 DIAGNOSIS — C44329 Squamous cell carcinoma of skin of other parts of face: Secondary | ICD-10-CM | POA: Diagnosis not present

## 2021-06-07 DIAGNOSIS — R296 Repeated falls: Secondary | ICD-10-CM

## 2021-06-07 NOTE — Therapy (Signed)
[facility] @ [address] [place], [place], [address] Phone: [phone]   Fax:  [phone]  Physical Therapy Treatment  Patient Details  Name: [name] [name] MRN: [id number] Date of Birth: 1961-06-11 Referring Provider (PT): [name], MD   Encounter Date: 06/07/2021   PT End of Session - 06/07/21 1323     Visit Number 22    Date for PT Re-Evaluation 07/12/21    Authorization Type UHC Medicare    Authorization - Visit Number 2    Authorization - Number of Visits 60    Progress Note Due on Visit 30    PT Start Time 0930    PT Stop Time 1015    PT Time Calculation (min) 45 min    Equipment Utilized During Treatment Gait belt    Activity Tolerance Patient tolerated treatment well    Behavior During Therapy [facility] for tasks assessed/performed             Past Medical History:  Diagnosis Date   Adenomatous colon polyp 10/1999   Anxiety    Arthritis    neck, yoga helps.   Cataract    Complex partial seizure ([place])    last seizure was 08-30-2018   Crohn's disease of small and large intestines ([place]) 1999   Depression    Diabetes mellitus    no meds at this time 10-24-17   Esophageal stricture    External hemorrhoids    GERD (gastroesophageal reflux disease)    Glaucoma    History of kidney stones    3 large stones still present   Hyperlipemia    Hypertension    resolved with weight loss    Inguinal hernia, bilateral 12/2019   Liver cirrhosis secondary to NASH ([place])    Migraines    Osteoporosis    PONV (postoperative nausea and vomiting)    PTSD (post-traumatic stress disorder)    Renal cyst, acquired, left 07/08/2017   Skin cancer    squamous cell multiple; [facility]; followed every 3 months.   Sleep apnea    CPAP machine, uses nightly   Staphylococcus aureus bacteremia with sepsis ([place]) 05/28/2017   MRSA 2012    Past Surgical History:  Procedure Laterality Date   CATARACT EXTRACTION Bilateral     COLONOSCOPY     DEBRIDEMENT MANDIBLE N/A 12/07/2020   Procedure: DEBRIDEMENT OF MANDIBLE;  Surgeon: [name], DMD;  Location: [place];  Service: Oral Surgery;  Laterality: N/A;   ELBOW SURGERY  2012   elbow MRSA infection    EYE SURGERY     cataracts removed, /w IOL   GROSS TEETH DEBRIDEMENT N/A 12/29/2020   Procedure: MANDIBULAR  DEBRIDEMENT;  Surgeon: [name], DMD;  Location: [place];  Service: Dentistry;  Laterality: N/A;   INCISION AND DRAINAGE ABSCESS N/A 12/07/2020   Procedure: INCISION AND DRAINAGE MANDIBULAR ABSCESS;  Surgeon: [name], DMD;  Location: [place];  Service: Oral Surgery;  Laterality: N/A;   INGUINAL HERNIA REPAIR Right 01/14/2020   Procedure: OPEN RIGHT HERNIA REPAIR INGUINAL WITH MESH;  Surgeon: [name], MD;  [facility];  Service: General;  Laterality: Right;   INSERTION OF MESH N/A 07/01/2016   Procedure: INSERTION OF MESH;  Surgeon: [name], MD;  Location: [place];  Service: General;  Laterality: N/A;   IR URETERAL STENT LEFT NEW ACCESS W/O SEP NEPHROSTOMY CATH  02/02/2018   NEPHROLITHOTOMY Left 02/02/2018   Procedure: NEPHROLITHOTOMY PERCUTANEOUS;  Surgeon:  [name], MD;  [facility];  Service: Urology;  Laterality: Left;   POLYPECTOMY     SCALP LACERATION REPAIR Right 10/16/2017   From fall/staples   SHOULDER ARTHROSCOPY Right 03/05/2018   Procedure: ARTHROSCOPY SHOULDER AND OPEN DISTAL CLAVICLE EXCISION;  Surgeon: [name], MD;  Location: [place];  Service: Orthopedics;  Laterality: Right;   SHOULDER SURGERY     SINUS SURGERY WITH INSTATRAK     TEE WITHOUT CARDIOVERSION N/A 05/09/2017   Procedure: TRANSESOPHAGEAL ECHOCARDIOGRAM (TEE);  Surgeon: [name], MD;  Location: [facility];  Service: Cardiovascular;  Laterality: N/A;   TOOTH EXTRACTION N/A 12/07/2020   Procedure: DENTAL EXTRACTIONS OF TEETH TWENTY TO THIRTY;  Surgeon: [name], DMD;  Location: [place];  Service: Oral Surgery;  Laterality:  N/A;   TOOTH EXTRACTION N/A 12/29/2020   Procedure: DENTAL RESTORATION/EXTRACTIONS;  Surgeon: [name], DMD;  Location: [place];  Service: Dentistry;  Laterality: N/A;   TRANSTHORACIC ECHOCARDIOGRAM  02/22/2011   EF 55-65%; increased pattern of LVH with mild conc hypertrophy, abnormal relaxation & increased filling pressure (grade 2 diastolic dysfunction); atrial septum thickened (lipomatous hypertrophy)   UMBILICAL HERNIA REPAIR N/A 07/01/2016   Procedure: UMBILICAL HERNIA REPAIR WITH MESH;  Surgeon: [name], MD;  Location: [place];  Service: General;  Laterality: N/A;   UPPER GASTROINTESTINAL ENDOSCOPY     VASECTOMY      There were no vitals filed for this visit.   Subjective Assessment - 06/07/21 1108     Subjective Less dizzy today than yesterday.    Pertinent History on disability, has memory problems, liver cirrhosis, Chrons, osteoporosis with multiple fractures including C6, thrombocytopenia, gluacoma (loss of peripheral vision).  Has lost 75lb through walking but concerned about safety    How long can you walk comfortably? 2.5 miles with walking sticks    Diagnostic tests C6 fracture and ligamentum flavum tear - non-surgical    Patient Stated Goals work on coming down and up stairs, balance    Currently in Pain? No/denies                               [facility] Adult PT Treatment/Exercise - 06/07/21 0001       Neuro Re-ed    Neuro Re-ed Details  standing cross punch 3lb with weight shifts x 20 reps, blue plyo ball diagonal lift with weight shift and trunk rotation x 10 each      Exercises   Exercises Shoulder;Knee/Hip;Lumbar      Lumbar Exercises: Aerobic   Nustep L5 x 8' seat 9 arms 10, PT present to monitor      Lumbar Exercises: Seated   Sit to Stand 10 reps    Sit to Stand Limitations hold 10lb      Knee/Hip Exercises: Standing   Forward Step Up Left;Right;1 set;10 reps;Step Height: 6"    Forward Step Up Limitations no UE support,  supervision    Walking with Sports Cord resisted walking bwd x 10 reps, blue band, 2 steps back/forth each rep                 Balance Exercises - 06/07/21 0001       Balance Exercises: Standing   Stepping Strategy Anterior;Lateral;5 reps   over hurdle, no UE support, close supervision   Gait with Head Turns 4 reps;Forward;Cognitive challenge   PT called up/down/Lt/Rt for head positioning x 4 laps length of long  hall in clinic, [place] to focus on grounding feet in middle path vs drift with head turn                 PT Short Term Goals - 04/05/21 0926       PT SHORT TERM GOAL #1   Title Patient will complete 3 minute walk test using LRAD with only supervision to indicate decreased fall risk.    Status Achieved      PT SHORT TERM GOAL #2   Title Pt will verbalize and demo safe strategies for stair pattern (step to for descending) and gait patterns/pace (slow down) without AD    Status Achieved      PT SHORT TERM GOAL #3   Title PT and Pt will explore gait with walker and make decisions about appropriateness of use for improved safety.    Baseline using walking sticks, has had 1 fall since starting PT    Status Achieved      PT SHORT TERM GOAL #4   Title Pt ind with initial HEP    Status Achieved               PT Long Term Goals - 05/30/21 0917       PT LONG TERM GOAL #1   Title Improve 5x sit to stand to </= 14 sec.    Baseline 9 sec    Status Achieved      PT LONG TERM GOAL #2   Title Pt will demo up/down stairs x 3 rounds (4 6 inch steps) using bil UEs and safe strategy to demo improved safety on stairs    Baseline step to pattern, no handrails, 1 LOB having to put hand on the wall    Status Achieved      PT LONG TERM GOAL #3   Title Pt will demo safety/no LOB with community ambulation for 6' walk test, curb up/down and grassy surface with supervision using least restrictive AD or no AD.    Baseline grassy surface needs more practice, making good  progress with side stepping off curb    Status On-going      PT LONG TERM GOAL #4   Title Pt will report at least 50% more confidence in balance with daily tasks    Baseline 40%    Status On-going      PT LONG TERM GOAL #5   Title Ind with advanced HEP and understand how to safely progress    Baseline -    Status On-going      PT LONG TERM GOAL #6   Title Improved DGI score to at least 18/24 to reduce fall risk with dynamic gait challenges    Baseline 8/24 first visit, 14/24 at re-eval 05/15/21    Status On-going                   Plan - 06/07/21 1324     Clinical Impression Statement Pt was able to maintain midline gait pattern without LOB today with cognitive challenge for 4-way head movements (up/down/Lt/Rt) while ambulating in hallway x 4 laps.  He was 100% successful with all hurdle step over reps x 5 fwd and lat using Rt and Lt LE with close supervision without LOB or catching foot on hurdle.  He performed 6" forward step ups without UEs today with close supervision.  He demos consistent, safe pattern of laterally stepping off curb when leaving his appointments in the parking lot.  Pt continues to  have occasional coordination difficulty with UE/LE combined tasks.  Continue along POC.    Comorbidities seizures, memory deficits, osteoporosis with Hx of multiple fractures    PT Frequency 2x / week    PT Duration 8 weeks    PT Treatment/Interventions ADLs/Self Care Home Management;Gait training;Stair training;Functional mobility training;Therapeutic activities;Therapeutic exercise;Balance training;Neuromuscular re-education;Manual techniques;Patient/family education;Passive range of motion    PT Next Visit Plan continue alt Lt/Rt tasks, cross body work with weight shifts, resisted retro walking green band, standing heel/toe raises    PT Home Exercise Plan [id number]    Consulted and Agree with Plan of Care Patient             Patient will benefit from skilled therapeutic  intervention in order to improve the following deficits and impairments:     Visit Diagnosis: Other abnormalities of gait and mobility  Muscle weakness (generalized)  Unspecified lack of coordination  Repeated falls     Problem List Patient Active Problem List   Diagnosis Date Noted   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 05/11/2021   Liver cirrhosis secondary to NASH (HCC)    GERD (gastroesophageal reflux disease)    Pancytopenia (HCC)    Osteomyelitis of jaw 12/29/2020   Acute osteomyelitis of mandible 12/26/2020   S/P inguinal hernia repair 01/14/2020   At high risk for falls    S/P shoulder surgery 03/05/2018   Excessive daytime sleepiness 02/18/2018   Crohn's disease of colon with rectal bleeding ([place]) 02/18/2018   Blood in stool 02/18/2018   Iron deficiency anemia due to chronic blood loss 02/18/2018   Iron deficiency anemia due to sideropenic dysphagia 02/18/2018   Renal calculus, left 02/02/2018   History of sepsis 12/16/2017   Renal cyst, acquired, left 07/08/2017   Frequent falls 07/08/2017   Renal lesion 05/28/2017   MSSA bacteremia 05/06/2017   Confusion 04/02/2017   Syncope 08/30/2016   Depression 04/30/2016   Memory loss 04/30/2016   OSA on CPAP 10/31/2015   Nonepileptic episode ([place]) 04/25/2015   Seizure ([place]) 04/04/2013   Abnormal EKG 03/12/2013   DOE (dyspnea on exertion) 03/12/2013   Complex partial seizure ([place]) 04/19/2012   DM2 (diabetes mellitus, type 2) ([place]) 09/25/2011   OTHER DYSPHAGIA 03/27/2009   Cough 11/10/2008   GERD 12/07/2007   COLONIC POLYPS, ADENOMATOUS, HX OF 12/07/2007   EXTERNAL HEMORRHOIDS 09/11/2007   CROHN'S DISEASE, LARGE AND SMALL INTESTINES 09/11/2007   OSTEOPOROSIS 09/11/2007   HYPERLIPIDEMIA NEC/NOS 02/27/2007   Essential hypertension 02/27/2007    Baruch Merl, PT 06/07/21 1:28 PM   [facility] @ [place], [place],  [address] Phone: [phone]   Fax:  [phone]  Name: [name] [name] MRN: [id number] Date of Birth: 03-06-1961

## 2021-06-08 ENCOUNTER — Ambulatory Visit
Admission: RE | Admit: 2021-06-08 | Discharge: 2021-06-08 | Disposition: A | Discharge status: Home / Self Care | Payer: Medicare Other | Source: Ambulatory Visit | Attending: Radiation Oncology | Admitting: Radiation Oncology

## 2021-06-08 ENCOUNTER — Encounter: Payer: Medicare Other | Admitting: Dietician

## 2021-06-08 ENCOUNTER — Other Ambulatory Visit: Payer: Self-pay

## 2021-06-08 DIAGNOSIS — C44329 Squamous cell carcinoma of skin of other parts of face: Secondary | ICD-10-CM | POA: Diagnosis not present

## 2021-06-11 ENCOUNTER — Ambulatory Visit
Admission: RE | Admit: 2021-06-11 | Discharge: 2021-06-11 | Disposition: A | Discharge status: Home / Self Care | Payer: Medicare Other | Source: Ambulatory Visit | Attending: Radiation Oncology | Admitting: Radiation Oncology

## 2021-06-11 DIAGNOSIS — C44329 Squamous cell carcinoma of skin of other parts of face: Secondary | ICD-10-CM | POA: Diagnosis not present

## 2021-06-12 ENCOUNTER — Ambulatory Visit
Admission: RE | Admit: 2021-06-12 | Discharge: 2021-06-12 | Disposition: A | Discharge status: Home / Self Care | Payer: Medicare Other | Source: Ambulatory Visit | Attending: Radiation Oncology | Admitting: Radiation Oncology

## 2021-06-12 ENCOUNTER — Other Ambulatory Visit: Payer: Self-pay

## 2021-06-12 ENCOUNTER — Inpatient Hospital Stay: Payer: Medicare Other | Attending: Radiation Oncology | Admitting: Dietician

## 2021-06-12 DIAGNOSIS — C44329 Squamous cell carcinoma of skin of other parts of face: Secondary | ICD-10-CM | POA: Diagnosis not present

## 2021-06-12 NOTE — Progress Notes (Signed)
Nutrition Follow-up:  Patient receiving radiation therapy for SCC of right cheek. S/p partial maxillectomy with orbital resection on 10/18.   Met with patient and wife after radiation therapy. He reports appetite is the same, eating one meal day. Patient reports he has increased intake some, eating lunch meal 3x/week in addition to dinner. Patient is drinking 1-2 Ensure day. Patient tolerating soft textures (mac and cheese, spaghetti, potatoes/gravy, pancakes, pasta) He has tried a few shake recipes. Patient is drinking ~2 bottles of water. He continues to work on decreasing intake of Coke, reports usually having 2. Patient continues to walk his dog up to 3 miles day. He enjoys this. Patient has sore throat, sometimes this is an intense 9/10 pain. Patient has not taken any pain medication. He reports hard candies soothe back of throat. His mouth is very dry. Patient has not started using baking soda salt water rinses.   Medications: reviewed  Labs: reviewed  Anthropometrics: Last weight 174.8 lb ([name] [name]) on 12/19 decreased from 176 lb on 12/12 and 178.6 on 12/5    NUTRITION DIAGNOSIS: Inadequate oral intake ongoing    INTERVENTION:  Reinforced importance of adequate nutrition to maintain weights/strength Encouraged soft smooth textures high in calories and protein - pt has handout and shake recipes  Patient will work to increase oral intake with smaller more frequent meals/snacks Encouraged pt to start doing baking soda, salt water rinses several times/day Patient is working to increase water intake Continue drinking 2 Ensure Plus/equivalent daily - coupons provided Continue activity as able  Patient has contact information     MONITORING, EVALUATION, GOAL: weight trends, intake   NEXT VISIT: Friday January 6 after radiation

## 2021-06-13 ENCOUNTER — Encounter: Payer: Medicare Other | Admitting: Physical Therapy

## 2021-06-13 ENCOUNTER — Ambulatory Visit
Admission: RE | Admit: 2021-06-13 | Discharge: 2021-06-13 | Disposition: A | Discharge status: Home / Self Care | Payer: Medicare Other | Source: Ambulatory Visit | Attending: Radiation Oncology | Admitting: Radiation Oncology

## 2021-06-13 DIAGNOSIS — C44329 Squamous cell carcinoma of skin of other parts of face: Secondary | ICD-10-CM | POA: Diagnosis not present

## 2021-06-14 ENCOUNTER — Ambulatory Visit
Admission: RE | Admit: 2021-06-14 | Discharge: 2021-06-14 | Disposition: A | Discharge status: Home / Self Care | Payer: Medicare Other | Source: Ambulatory Visit | Attending: Radiation Oncology | Admitting: Radiation Oncology

## 2021-06-14 ENCOUNTER — Ambulatory Visit: Payer: Medicare Other | Admitting: Physical Therapy

## 2021-06-14 ENCOUNTER — Encounter: Payer: Self-pay | Admitting: Physical Therapy

## 2021-06-14 ENCOUNTER — Other Ambulatory Visit: Payer: Self-pay

## 2021-06-14 DIAGNOSIS — R279 Unspecified lack of coordination: Secondary | ICD-10-CM

## 2021-06-14 DIAGNOSIS — R2689 Other abnormalities of gait and mobility: Secondary | ICD-10-CM

## 2021-06-14 DIAGNOSIS — M6281 Muscle weakness (generalized): Secondary | ICD-10-CM

## 2021-06-14 DIAGNOSIS — R296 Repeated falls: Secondary | ICD-10-CM

## 2021-06-14 DIAGNOSIS — C44329 Squamous cell carcinoma of skin of other parts of face: Secondary | ICD-10-CM | POA: Diagnosis not present

## 2021-06-14 NOTE — Therapy (Signed)
[place] @ [address] [place], [place], [address] Phone: [phone]   Fax:  [phone]  Physical Therapy Treatment  Patient Details  Name: [name] [name] MRN: [id number] Date of Birth: 1961/01/09 Referring Provider (PT): [name], MD   Encounter Date: 06/14/2021   PT End of Session - 06/14/21 1112     Visit Number 23    Date for PT Re-Evaluation 07/12/21    Authorization Type UHC Medicare    Authorization - Visit Number 23    Authorization - Number of Visits 60    PT Start Time 1100    PT Stop Time 1142    PT Time Calculation (min) 42 min    Equipment Utilized During Treatment Gait belt    Activity Tolerance Patient tolerated treatment well    Behavior During Therapy [facility] for tasks assessed/performed             Past Medical History:  Diagnosis Date   Adenomatous colon polyp 10/1999   Anxiety    Arthritis    neck, yoga helps.   Cataract    Complex partial seizure ([place])    last seizure was 08-30-2018   Crohn's disease of small and large intestines ([place]) 1999   Depression    Diabetes mellitus    no meds at this time 10-24-17   Esophageal stricture    External hemorrhoids    GERD (gastroesophageal reflux disease)    Glaucoma    History of kidney stones    3 large stones still present   Hyperlipemia    Hypertension    resolved with weight loss    Inguinal hernia, bilateral 12/2019   Liver cirrhosis secondary to NASH ([place])    Migraines    Osteoporosis    PONV (postoperative nausea and vomiting)    PTSD (post-traumatic stress disorder)    Renal cyst, acquired, left 07/08/2017   Skin cancer    squamous cell multiple; [facility]; followed every 3 months.   Sleep apnea    CPAP machine, uses nightly   Staphylococcus aureus bacteremia with sepsis ([place]) 05/28/2017   MRSA 2012    Past Surgical History:  Procedure Laterality Date   CATARACT EXTRACTION Bilateral    COLONOSCOPY     DEBRIDEMENT  MANDIBLE N/A 12/07/2020   Procedure: DEBRIDEMENT OF MANDIBLE;  Surgeon: [name], DMD;  Location: [place];  Service: Oral Surgery;  Laterality: N/A;   ELBOW SURGERY  2012   elbow MRSA infection    EYE SURGERY     cataracts removed, /w IOL   GROSS TEETH DEBRIDEMENT N/A 12/29/2020   Procedure: MANDIBULAR  DEBRIDEMENT;  Surgeon: [name], DMD;  Location: [place];  Service: Dentistry;  Laterality: N/A;   INCISION AND DRAINAGE ABSCESS N/A 12/07/2020   Procedure: INCISION AND DRAINAGE MANDIBULAR ABSCESS;  Surgeon: [name], DMD;  Location: [place];  Service: Oral Surgery;  Laterality: N/A;   INGUINAL HERNIA REPAIR Right 01/14/2020   Procedure: OPEN RIGHT HERNIA REPAIR INGUINAL WITH MESH;  Surgeon: [name], MD;  [facility];  Service: General;  Laterality: Right;   INSERTION OF MESH N/A 07/01/2016   Procedure: INSERTION OF MESH;  Surgeon: [name], MD;  Location: [place];  Service: General;  Laterality: N/A;   IR URETERAL STENT LEFT NEW ACCESS W/O SEP NEPHROSTOMY CATH  02/02/2018   NEPHROLITHOTOMY Left 02/02/2018   Procedure: NEPHROLITHOTOMY PERCUTANEOUS;  Surgeon: [name], MD;  [facility];  Service:  Urology;  Laterality: Left;   POLYPECTOMY     SCALP LACERATION REPAIR Right 10/16/2017   From fall/staples   SHOULDER ARTHROSCOPY Right 03/05/2018   Procedure: ARTHROSCOPY SHOULDER AND OPEN DISTAL CLAVICLE EXCISION;  Surgeon: [name], MD;  Location: [place];  Service: Orthopedics;  Laterality: Right;   SHOULDER SURGERY     SINUS SURGERY WITH INSTATRAK     TEE WITHOUT CARDIOVERSION N/A 05/09/2017   Procedure: TRANSESOPHAGEAL ECHOCARDIOGRAM (TEE);  Surgeon: [name], MD;  [facility]: [facility];  Service: Cardiovascular;  Laterality: N/A;   TOOTH EXTRACTION N/A 12/07/2020   Procedure: DENTAL EXTRACTIONS OF TEETH TWENTY TO THIRTY;  Surgeon: [name], DMD;  Location: [place];  Service: Oral Surgery;  Laterality: N/A;   TOOTH EXTRACTION N/A  12/29/2020   Procedure: DENTAL RESTORATION/EXTRACTIONS;  Surgeon: [name], DMD;  Location: [place];  Service: Dentistry;  Laterality: N/A;   TRANSTHORACIC ECHOCARDIOGRAM  02/22/2011   EF 55-65%; increased pattern of LVH with mild conc hypertrophy, abnormal relaxation & increased filling pressure (grade 2 diastolic dysfunction); atrial septum thickened (lipomatous hypertrophy)   UMBILICAL HERNIA REPAIR N/A 07/01/2016   Procedure: UMBILICAL HERNIA REPAIR WITH MESH;  Surgeon: [name], MD;  Location: [place];  Service: General;  Laterality: N/A;   UPPER GASTROINTESTINAL ENDOSCOPY     VASECTOMY      There were no vitals filed for this visit.   Subjective Assessment - 06/14/21 1110     Subjective I passed out from dizziness last night on couch when I was trying to stand up.  I have 5 more areas of cancer they found on my face.  I meet with oncologist to determine plan.  Still undergoing radiation for face.    Pertinent History on disability, has memory problems, liver cirrhosis, Chrons, osteoporosis with multiple fractures including C6, thrombocytopenia, gluacoma (loss of peripheral vision).  Has lost 75lb through walking but concerned about safety    How long can you walk comfortably? 2.5 miles with walking sticks    Diagnostic tests C6 fracture and ligamentum flavum tear - non-surgical    Patient Stated Goals work on coming down and up stairs, balance    Currently in Pain? No/denies                               [facility] Adult PT Treatment/Exercise - 06/14/21 0001       Neuro Re-ed    Neuro Re-ed Details  standing cross punch 4lb with weight shifts x 20 reps, blue plyo ball diagonal lift with weight shift and trunk rotation x 10 each      Exercises   Exercises Shoulder;Knee/Hip;Lumbar      Knee/Hip Exercises: Standing   Other Standing Knee Exercises farmer carry 160' eash, 10lb Rt, Lt                 Balance Exercises - 06/14/21 0001       Balance  Exercises: Standing   Gait with Head Turns 4 reps;Forward;Cognitive challenge   PT called up/down/Lt/Rt for head positioning x 4 laps length of long hall in clinic, VC to focus on grounding feet in middle path vs drift with head turn   Step Over Hurdles / Cones hurdle step over lat/fwd Rt/Lt with cognitive challenge of PT call out foot/direction x 4'    Heel Raises Both;15 reps   hands on wall   Toe Raise 15 reps;Both   hands  on wall   Sit to Stand Standard surface   10lb x 12 reps   Lift / Chop 20 reps   blue plyo ball with LE weight shift                 PT Short Term Goals - 04/05/21 0926       PT SHORT TERM GOAL #1   Title Patient will complete 3 minute walk test using LRAD with only supervision to indicate decreased fall risk.    Status Achieved      PT SHORT TERM GOAL #2   Title Pt will verbalize and demo safe strategies for stair pattern (step to for descending) and gait patterns/pace (slow down) without AD    Status Achieved      PT SHORT TERM GOAL #3   Title PT and Pt will explore gait with walker and make decisions about appropriateness of use for improved safety.    Baseline using walking sticks, has had 1 fall since starting PT    Status Achieved      PT SHORT TERM GOAL #4   Title Pt ind with initial HEP    Status Achieved               PT Long Term Goals - 05/30/21 0917       PT LONG TERM GOAL #1   Title Improve 5x sit to stand to </= 14 sec.    Baseline 9 sec    Status Achieved      PT LONG TERM GOAL #2   Title Pt will demo up/down stairs x 3 rounds (4 6 inch steps) using bil UEs and safe strategy to demo improved safety on stairs    Baseline step to pattern, no handrails, 1 LOB having to put hand on the wall    Status Achieved      PT LONG TERM GOAL #3   Title Pt will demo safety/no LOB with community ambulation for 6' walk test, curb up/down and grassy surface with supervision using least restrictive AD or no AD.    Baseline grassy surface  needs more practice, making good progress with side stepping off curb    Status On-going      PT LONG TERM GOAL #4   Title Pt will report at least 50% more confidence in balance with daily tasks    Baseline 40%    Status On-going      PT LONG TERM GOAL #5   Title Ind with advanced HEP and understand how to safely progress    Baseline -    Status On-going      PT LONG TERM GOAL #6   Title Improved DGI score to at least 18/24 to reduce fall risk with dynamic gait challenges    Baseline 8/24 first visit, 14/24 at re-eval 05/15/21    Status On-going                   Plan - 06/14/21 1144     Clinical Impression Statement Pt continues to display much improved control and coordination with gait with 4-way head turns and with multi-directional hurdle step overs.  He can now coordinate multi-step verbal cue patterns of stepping over obstacles.  He shifts weight well into Rt and Lt LE with chops and lifts without LOB.  Pt continues to display improved safety and has not had any falls.  He is undergoing radiation for skin cancer and has ongoing intermittent dizziness but this has  not affected his participation and progress in PT.  Continue along POC.    Comorbidities seizures, memory deficits, osteoporosis with Hx of multiple fractures    PT Frequency 2x / week    PT Duration 8 weeks    PT Treatment/Interventions ADLs/Self Care Home Management;Gait training;Stair training;Functional mobility training;Therapeutic activities;Therapeutic exercise;Balance training;Neuromuscular re-education;Manual techniques;Patient/family education;Passive range of motion    PT Next Visit Plan continue alt Lt/Rt tasks, cross body work with weight shifts, resisted retro walking green band, standing heel/toe raises    PT Home Exercise Plan [id number]    Consulted and Agree with Plan of Care Patient    Family Member Consulted Pt's wife             Patient will benefit from skilled therapeutic  intervention in order to improve the following deficits and impairments:     Visit Diagnosis: Other abnormalities of gait and mobility  Muscle weakness (generalized)  Unspecified lack of coordination  Repeated falls     Problem List Patient Active Problem List   Diagnosis Date Noted   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 05/11/2021   Liver cirrhosis secondary to NASH (HCC)    GERD (gastroesophageal reflux disease)    Pancytopenia (HCC)    Osteomyelitis of jaw 12/29/2020   Acute osteomyelitis of mandible 12/26/2020   S/P inguinal hernia repair 01/14/2020   At high risk for falls    S/P shoulder surgery 03/05/2018   Excessive daytime sleepiness 02/18/2018   Crohn's disease of colon with rectal bleeding ([place]) 02/18/2018   Blood in stool 02/18/2018   Iron deficiency anemia due to chronic blood loss 02/18/2018   Iron deficiency anemia due to sideropenic dysphagia 02/18/2018   Renal calculus, left 02/02/2018   History of sepsis 12/16/2017   Renal cyst, acquired, left 07/08/2017   Frequent falls 07/08/2017   Renal lesion 05/28/2017   MSSA bacteremia 05/06/2017   Confusion 04/02/2017   Syncope 08/30/2016   Depression 04/30/2016   Memory loss 04/30/2016   OSA on CPAP 10/31/2015   Nonepileptic episode ([place]) 04/25/2015   Seizure ([place]) 04/04/2013   Abnormal EKG 03/12/2013   DOE (dyspnea on exertion) 03/12/2013   Complex partial seizure ([place]) 04/19/2012   DM2 (diabetes mellitus, type 2) ([place]) 09/25/2011   OTHER DYSPHAGIA 03/27/2009   Cough 11/10/2008   GERD 12/07/2007   COLONIC POLYPS, ADENOMATOUS, HX OF 12/07/2007   EXTERNAL HEMORRHOIDS 09/11/2007   CROHN'S DISEASE, LARGE AND SMALL INTESTINES 09/11/2007   OSTEOPOROSIS 09/11/2007   HYPERLIPIDEMIA NEC/NOS 02/27/2007   Essential hypertension 02/27/2007    Baruch Merl, PT 06/14/21 1:16 PM   [facility] @ [address] [place], [place],  [address] Phone: [phone]   Fax:  [phone]  Name: [name] [name] MRN: [id number] Date of Birth: 1961/02/17

## 2021-06-15 ENCOUNTER — Ambulatory Visit
Admission: RE | Admit: 2021-06-15 | Discharge: 2021-06-15 | Disposition: A | Discharge status: Home / Self Care | Payer: Medicare Other | Source: Ambulatory Visit | Attending: Radiation Oncology | Admitting: Radiation Oncology

## 2021-06-15 DIAGNOSIS — C44329 Squamous cell carcinoma of skin of other parts of face: Secondary | ICD-10-CM | POA: Diagnosis not present

## 2021-06-19 ENCOUNTER — Ambulatory Visit
Admission: RE | Admit: 2021-06-19 | Discharge: 2021-06-19 | Disposition: A | Discharge status: Home / Self Care | Payer: Medicare Other | Source: Ambulatory Visit | Attending: Radiation Oncology | Admitting: Radiation Oncology

## 2021-06-19 ENCOUNTER — Other Ambulatory Visit: Payer: Self-pay

## 2021-06-19 DIAGNOSIS — C44329 Squamous cell carcinoma of skin of other parts of face: Secondary | ICD-10-CM | POA: Diagnosis not present

## 2021-06-20 ENCOUNTER — Ambulatory Visit
Admission: RE | Admit: 2021-06-20 | Discharge: 2021-06-20 | Disposition: A | Discharge status: Home / Self Care | Payer: Medicare Other | Source: Ambulatory Visit | Attending: Radiation Oncology | Admitting: Radiation Oncology

## 2021-06-20 ENCOUNTER — Other Ambulatory Visit: Payer: Self-pay

## 2021-06-20 ENCOUNTER — Encounter: Payer: Self-pay | Admitting: Rehabilitative and Restorative Service Providers"

## 2021-06-20 ENCOUNTER — Ambulatory Visit: Payer: Medicare Other | Admitting: Rehabilitative and Restorative Service Providers"

## 2021-06-20 DIAGNOSIS — M6281 Muscle weakness (generalized): Secondary | ICD-10-CM

## 2021-06-20 DIAGNOSIS — R296 Repeated falls: Secondary | ICD-10-CM

## 2021-06-20 DIAGNOSIS — R2689 Other abnormalities of gait and mobility: Secondary | ICD-10-CM

## 2021-06-20 DIAGNOSIS — C44329 Squamous cell carcinoma of skin of other parts of face: Secondary | ICD-10-CM | POA: Diagnosis not present

## 2021-06-20 NOTE — Therapy (Signed)
[facility] @ [facility] [place], [place], [address] Phone: [phone]   Fax:  [phone]  Physical Therapy Treatment  Patient Details  Name: [name] [name] MRN: [id number] Date of Birth: [birth year] Referring Provider (PT): [name], MD   Encounter Date: 06/20/2021   PT End of Session - 06/20/21 1041     Visit Number 24    Date for PT Re-Evaluation 07/12/21    Authorization Type UHC Medicare    Authorization - Visit Number 24    Authorization - Number of Visits 60    Progress Note Due on Visit 30    PT Start Time 1016    PT Stop Time 1055    PT Time Calculation (min) 39 min    Activity Tolerance Patient tolerated treatment well    Behavior During Therapy [facility] for tasks assessed/performed             Past Medical History:  Diagnosis Date   Adenomatous colon polyp 10/1999   Anxiety    Arthritis    neck, yoga helps.   Cataract    Complex partial seizure ([place])    last seizure was 08-30-2018   Crohn's disease of small and large intestines ([place]) 1999   Depression    Diabetes mellitus    no meds at this time 10-24-17   Esophageal stricture    External hemorrhoids    GERD (gastroesophageal reflux disease)    Glaucoma    History of kidney stones    3 large stones still present   Hyperlipemia    Hypertension    resolved with weight loss    Inguinal hernia, bilateral 12/2019   Liver cirrhosis secondary to NASH ([place])    Migraines    Osteoporosis    PONV (postoperative nausea and vomiting)    PTSD (post-traumatic stress disorder)    Renal cyst, acquired, left 07/08/2017   Skin cancer    squamous cell multiple; [facility]; followed every 3 months.   Sleep apnea    CPAP machine, uses nightly   Staphylococcus aureus bacteremia with sepsis ([place]) 05/28/2017   MRSA 2012    Past Surgical History:  Procedure Laterality Date   CATARACT EXTRACTION Bilateral    COLONOSCOPY     DEBRIDEMENT MANDIBLE N/A  12/07/2020   Procedure: DEBRIDEMENT OF MANDIBLE;  Surgeon: [name], DMD;  Location: [place];  Service: Oral Surgery;  Laterality: N/A;   ELBOW SURGERY  2012   elbow MRSA infection    EYE SURGERY     cataracts removed, /w IOL   GROSS TEETH DEBRIDEMENT N/A 12/29/2020   Procedure: MANDIBULAR  DEBRIDEMENT;  Surgeon: [name], DMD;  Location: [place];  Service: Dentistry;  Laterality: N/A;   INCISION AND DRAINAGE ABSCESS N/A 12/07/2020   Procedure: INCISION AND DRAINAGE MANDIBULAR ABSCESS;  Surgeon: [name], DMD;  Location: [place];  Service: Oral Surgery;  Laterality: N/A;   INGUINAL HERNIA REPAIR Right 01/14/2020   Procedure: OPEN RIGHT HERNIA REPAIR INGUINAL WITH MESH;  Surgeon: [name], MD;  [facility];  Service: General;  Laterality: Right;   INSERTION OF MESH N/A 07/01/2016   Procedure: INSERTION OF MESH;  Surgeon: [name], MD;  Location: [place];  Service: General;  Laterality: N/A;   IR URETERAL STENT LEFT NEW ACCESS W/O SEP NEPHROSTOMY CATH  02/02/2018   NEPHROLITHOTOMY Left 02/02/2018   Procedure: NEPHROLITHOTOMY PERCUTANEOUS;  Surgeon: [name], MD;  [facility];  Service:  Urology;  Laterality: Left;   POLYPECTOMY     SCALP LACERATION REPAIR Right 10/16/2017   From fall/staples   SHOULDER ARTHROSCOPY Right 03/05/2018   Procedure: ARTHROSCOPY SHOULDER AND OPEN DISTAL CLAVICLE EXCISION;  Surgeon: [name], MD;  Location: [place];  Service: Orthopedics;  Laterality: Right;   SHOULDER SURGERY     SINUS SURGERY WITH INSTATRAK     TEE WITHOUT CARDIOVERSION N/A 05/09/2017   Procedure: TRANSESOPHAGEAL ECHOCARDIOGRAM (TEE);  Surgeon: [name], MD;  Location: [facility];  Service: Cardiovascular;  Laterality: N/A;   TOOTH EXTRACTION N/A 12/07/2020   Procedure: DENTAL EXTRACTIONS OF TEETH TWENTY TO THIRTY;  Surgeon: [name], DMD;  Location: [place];  Service: Oral Surgery;  Laterality: N/A;   TOOTH EXTRACTION N/A 12/29/2020    Procedure: DENTAL RESTORATION/EXTRACTIONS;  Surgeon: [name], DMD;  Location: [place];  Service: Dentistry;  Laterality: N/A;   TRANSTHORACIC ECHOCARDIOGRAM  02/22/2011   EF 55-65%; increased pattern of LVH with mild conc hypertrophy, abnormal relaxation & increased filling pressure (grade 2 diastolic dysfunction); atrial septum thickened (lipomatous hypertrophy)   UMBILICAL HERNIA REPAIR N/A 07/01/2016   Procedure: UMBILICAL HERNIA REPAIR WITH MESH;  Surgeon: [name], MD;  Location: [place];  Service: General;  Laterality: N/A;   UPPER GASTROINTESTINAL ENDOSCOPY     VASECTOMY      There were no vitals filed for this visit.   Subjective Assessment - 06/20/21 1020     Subjective Pt reports taht he was getting up off the couch this weekend and he got dizzy and passed out.    Pertinent History on disability, has memory problems, liver cirrhosis, Chrons, osteoporosis with multiple fractures including C6, thrombocytopenia, gluacoma (loss of peripheral vision).  Has lost 75lb through walking but concerned about safety    Patient Stated Goals work on coming down and up stairs, balance    Currently in Pain? No/denies                               [facility] Adult PT Treatment/Exercise - 06/20/21 0001       Neuro Re-ed    Neuro Re-ed Details  standing cross punch 4lb with weight shifts x 20 reps, blue plyo ball diagonal lift with weight shift and trunk rotation x 10 each      Lumbar Exercises: Aerobic   Nustep L5 x 8' seat 9 arms 10, PT present to monitor   729 steps     Knee/Hip Exercises: Standing   Other Standing Knee Exercises farmer carry 160' eash, 10lb Rt, Lt                 Balance Exercises - 06/20/21 0001       Balance Exercises: Standing   Gait with Head Turns 4 reps;Forward;Cognitive challenge   PT called up/down/Lt/Rt for head positioning x 4 laps length of long hall in clinic, VC to focus on grounding feet in middle path vs drift with head  turn   Step Over Hurdles / Cones hurdle step over lat/fwd Rt/Lt with cognitive challenge of PT call out foot/direction x 4'    Marching Foam/compliant surface;20 reps   with UE support standing on blue foam   Heel Raises Both;20 reps   standing on blue foam with hands on wall   Toe Raise Both;20 reps   standing on blue foam with hands on wall   Sit to Stand Standard surface  10lb x 12 reps                 PT Short Term Goals - 04/05/21 0926       PT SHORT TERM GOAL #1   Title Patient will complete 3 minute walk test using LRAD with only supervision to indicate decreased fall risk.    Status Achieved      PT SHORT TERM GOAL #2   Title Pt will verbalize and demo safe strategies for stair pattern (step to for descending) and gait patterns/pace (slow down) without AD    Status Achieved      PT SHORT TERM GOAL #3   Title PT and Pt will explore gait with walker and make decisions about appropriateness of use for improved safety.    Baseline using walking sticks, has had 1 fall since starting PT    Status Achieved      PT SHORT TERM GOAL #4   Title Pt ind with initial HEP    Status Achieved               PT Long Term Goals - 06/20/21 1103       PT LONG TERM GOAL #3   Title Pt will demo safety/no LOB with community ambulation for 6' walk test, curb up/down and grassy surface with supervision using least restrictive AD or no AD.    Status On-going                   Plan - 06/20/21 1100     Clinical Impression Statement [name] [name] continues to progress towards goal related activities.  He did not report any dizziness during PT visit, but did have multiple cases of unsteadiness and decreased balance requiring CGA to correct and prevent LOB.  Progressed heel raises and toe raises to on top of blue foam and added marching on blue foam for a balance challenge. Pt continues to require skilled PT to progress him towards improved balance.    PT Treatment/Interventions  ADLs/Self Care Home Management;Gait training;Stair training;Functional mobility training;Therapeutic activities;Therapeutic exercise;Balance training;Neuromuscular re-education;Manual techniques;Patient/family education;Passive range of motion    PT Next Visit Plan continue alt Lt/Rt tasks, cross body work with weight shifts, resisted retro walking green band, standing heel/toe raises    Consulted and Agree with Plan of Care Patient             Patient will benefit from skilled therapeutic intervention in order to improve the following deficits and impairments:  Decreased coordination, Decreased mobility, Postural dysfunction, Pain, Decreased safety awareness, Impaired perceived functional ability, Decreased balance, Decreased cognition, Impaired flexibility, Dizziness  Visit Diagnosis: Other abnormalities of gait and mobility  Muscle weakness (generalized)  Repeated falls     Problem List Patient Active Problem List   Diagnosis Date Noted   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 05/11/2021   Liver cirrhosis secondary to NASH (HCC)    GERD (gastroesophageal reflux disease)    Pancytopenia (HCC)    Osteomyelitis of jaw 12/29/2020   Acute osteomyelitis of mandible 12/26/2020   S/P inguinal hernia repair 01/14/2020   At high risk for falls    S/P shoulder surgery 03/05/2018   Excessive daytime sleepiness 02/18/2018   Crohn's disease of colon with rectal bleeding ([place]) 02/18/2018   Blood in stool 02/18/2018   Iron deficiency anemia due to chronic blood loss 02/18/2018   Iron deficiency anemia due to sideropenic dysphagia 02/18/2018   Renal calculus, left 02/02/2018   History  of sepsis 12/16/2017   Renal cyst, acquired, left 07/08/2017   Frequent falls 07/08/2017   Renal lesion 05/28/2017   MSSA bacteremia 05/06/2017   Confusion 04/02/2017   Syncope 08/30/2016   Depression 04/30/2016   Memory loss 04/30/2016   OSA on CPAP 10/31/2015   Nonepileptic episode ([place])  04/25/2015   Seizure ([place]) 04/04/2013   Abnormal EKG 03/12/2013   DOE (dyspnea on exertion) 03/12/2013   Complex partial seizure ([place]) 04/19/2012   DM2 (diabetes mellitus, type 2) ([place]) 09/25/2011   OTHER DYSPHAGIA 03/27/2009   Cough 11/10/2008   GERD 12/07/2007   COLONIC POLYPS, ADENOMATOUS, HX OF 12/07/2007   EXTERNAL HEMORRHOIDS 09/11/2007   CROHN'S DISEASE, LARGE AND SMALL INTESTINES 09/11/2007   OSTEOPOROSIS 09/11/2007   HYPERLIPIDEMIA NEC/NOS 02/27/2007   Essential hypertension 02/27/2007    Juel Burrow, PT, DPT 06/20/2021, 11:03 AM  [facility] @ [address] [place], [place], [address] Phone: [phone]   Fax:  [phone]  Name: [name] [name] MRN: [id number] Date of Birth: 09-10-1960

## 2021-06-21 ENCOUNTER — Other Ambulatory Visit: Payer: Self-pay

## 2021-06-21 ENCOUNTER — Ambulatory Visit
Admission: RE | Admit: 2021-06-21 | Discharge: 2021-06-21 | Disposition: A | Discharge status: Home / Self Care | Payer: Medicare Other | Source: Ambulatory Visit | Attending: Radiation Oncology | Admitting: Radiation Oncology

## 2021-06-21 DIAGNOSIS — C44329 Squamous cell carcinoma of skin of other parts of face: Secondary | ICD-10-CM | POA: Diagnosis not present

## 2021-06-22 ENCOUNTER — Encounter: Payer: Medicare Other | Admitting: Dietician

## 2021-06-22 ENCOUNTER — Ambulatory Visit
Admission: RE | Admit: 2021-06-22 | Discharge: 2021-06-22 | Disposition: A | Discharge status: Home / Self Care | Payer: Medicare Other | Source: Ambulatory Visit | Attending: Radiation Oncology | Admitting: Radiation Oncology

## 2021-06-22 DIAGNOSIS — C44329 Squamous cell carcinoma of skin of other parts of face: Secondary | ICD-10-CM | POA: Diagnosis not present

## 2021-06-26 ENCOUNTER — Other Ambulatory Visit: Payer: Self-pay

## 2021-06-26 ENCOUNTER — Encounter: Payer: Medicare Other | Admitting: Nutrition

## 2021-06-26 ENCOUNTER — Telehealth: Payer: Self-pay | Admitting: Internal Medicine

## 2021-06-26 ENCOUNTER — Ambulatory Visit
Admission: RE | Admit: 2021-06-26 | Discharge: 2021-06-26 | Disposition: A | Discharge status: Home / Self Care | Payer: Medicare Other | Source: Ambulatory Visit | Attending: Radiation Oncology | Admitting: Radiation Oncology

## 2021-06-26 DIAGNOSIS — C44329 Squamous cell carcinoma of skin of other parts of face: Secondary | ICD-10-CM | POA: Diagnosis not present

## 2021-06-26 DIAGNOSIS — R279 Unspecified lack of coordination: Secondary | ICD-10-CM | POA: Insufficient documentation

## 2021-06-26 DIAGNOSIS — R2689 Other abnormalities of gait and mobility: Secondary | ICD-10-CM | POA: Insufficient documentation

## 2021-06-26 DIAGNOSIS — M6281 Muscle weakness (generalized): Secondary | ICD-10-CM | POA: Diagnosis not present

## 2021-06-26 DIAGNOSIS — R296 Repeated falls: Secondary | ICD-10-CM | POA: Insufficient documentation

## 2021-06-26 NOTE — Telephone Encounter (Signed)
Scheduled appt per 12/27 referral. Pt's wife is aware of appt date and time.

## 2021-06-27 ENCOUNTER — Encounter: Payer: Self-pay | Admitting: Physical Therapy

## 2021-06-27 ENCOUNTER — Ambulatory Visit
Admission: RE | Admit: 2021-06-27 | Discharge: 2021-06-27 | Disposition: A | Discharge status: Home / Self Care | Payer: Medicare Other | Source: Ambulatory Visit | Attending: Radiation Oncology | Admitting: Radiation Oncology

## 2021-06-27 ENCOUNTER — Ambulatory Visit: Payer: Medicare Other | Attending: Family Medicine | Admitting: Physical Therapy

## 2021-06-27 DIAGNOSIS — R2689 Other abnormalities of gait and mobility: Secondary | ICD-10-CM | POA: Diagnosis not present

## 2021-06-27 DIAGNOSIS — R279 Unspecified lack of coordination: Secondary | ICD-10-CM | POA: Diagnosis not present

## 2021-06-27 DIAGNOSIS — M6281 Muscle weakness (generalized): Secondary | ICD-10-CM

## 2021-06-27 DIAGNOSIS — C44329 Squamous cell carcinoma of skin of other parts of face: Secondary | ICD-10-CM | POA: Diagnosis not present

## 2021-06-27 DIAGNOSIS — R296 Repeated falls: Secondary | ICD-10-CM

## 2021-06-27 NOTE — Therapy (Signed)
[facility] [place], [place], [address] Phone: [phone]   Fax:  [phone]  Physical Therapy Treatment  Patient Details  Name: [name] [name] MRN: [id number] Date of Birth: [birth year] Referring Provider (PT): [name], MD   Encounter Date: 06/27/2021   PT End of Session - 06/27/21 0938     Visit Number 25    Date for PT Re-Evaluation 07/12/21    Authorization Type [employer] Medicare    Authorization - Visit Number 25    Authorization - Number of Visits 60    Progress Note Due on Visit 30    PT Start Time 0935    PT Stop Time 1015    PT Time Calculation (min) 40 min    Equipment Utilized During Treatment Gait belt    Activity Tolerance Patient tolerated treatment well    Behavior During Therapy [facility] for tasks assessed/performed             Past Medical History:  Diagnosis Date   Adenomatous colon polyp 10/1999   Anxiety    Arthritis    neck, yoga helps.   Cataract    Complex partial seizure ([place])    last seizure was 08-30-2018   Crohn's disease of small and large intestines ([place]) 1999   Depression    Diabetes mellitus    no meds at this time 10-24-17   Esophageal stricture    External hemorrhoids    GERD (gastroesophageal reflux disease)    Glaucoma    History of kidney stones    3 large stones still present   Hyperlipemia    Hypertension    resolved with weight loss    Inguinal hernia, bilateral 12/2019   Liver cirrhosis secondary to NASH ([place])    Migraines    Osteoporosis    PONV (postoperative nausea and vomiting)    PTSD (post-traumatic stress disorder)    Renal cyst, acquired, left 07/08/2017   Skin cancer    squamous cell multiple; [facility]; followed every 3 months.   Sleep apnea    CPAP machine, uses nightly   Staphylococcus aureus bacteremia with sepsis ([place]) 05/28/2017   MRSA 2012    Past Surgical History:  Procedure Laterality Date   CATARACT EXTRACTION Bilateral     COLONOSCOPY     DEBRIDEMENT MANDIBLE N/A 12/07/2020   Procedure: DEBRIDEMENT OF MANDIBLE;  Surgeon: [name], DMD;  Location: [facility];  Service: Oral Surgery;  Laterality: N/A;   ELBOW SURGERY  2012   elbow MRSA infection    EYE SURGERY     cataracts removed, /w IOL   GROSS TEETH DEBRIDEMENT N/A 12/29/2020   Procedure: MANDIBULAR  DEBRIDEMENT;  Surgeon: [name], DMD;  Location: [facility];  Service: Dentistry;  Laterality: N/A;   INCISION AND DRAINAGE ABSCESS N/A 12/07/2020   Procedure: INCISION AND DRAINAGE MANDIBULAR ABSCESS;  Surgeon: [name], DMD;  Location: [facility];  Service: Oral Surgery;  Laterality: N/A;   INGUINAL HERNIA REPAIR Right 01/14/2020   Procedure: OPEN RIGHT HERNIA REPAIR INGUINAL WITH MESH;  Surgeon: [name], MD;  Location: [facility];  Service: General;  Laterality: Right;   INSERTION OF MESH N/A 07/01/2016   Procedure: INSERTION OF MESH;  Surgeon: [name], MD;  Location: [facility];  Service: General;  Laterality: N/A;   IR URETERAL STENT LEFT NEW ACCESS W/O SEP NEPHROSTOMY CATH  02/02/2018   NEPHROLITHOTOMY Left 02/02/2018   Procedure: NEPHROLITHOTOMY PERCUTANEOUS;  Surgeon:  [name], MD;  Location: [facility];  Service: Urology;  Laterality: Left;   POLYPECTOMY     SCALP LACERATION REPAIR Right 10/16/2017   From fall/staples   SHOULDER ARTHROSCOPY Right 03/05/2018   Procedure: ARTHROSCOPY SHOULDER AND OPEN DISTAL CLAVICLE EXCISION;  Surgeon: [name], MD;  Location: [facility];  Service: Orthopedics;  Laterality: Right;   SHOULDER SURGERY     SINUS SURGERY WITH INSTATRAK     TEE WITHOUT CARDIOVERSION N/A 05/09/2017   Procedure: TRANSESOPHAGEAL ECHOCARDIOGRAM (TEE);  Surgeon: [name], MD;  Location[facility];  Service: Cardiovascular;  Laterality: N/A;   TOOTH EXTRACTION N/A 12/07/2020   Procedure: DENTAL EXTRACTIONS OF TEETH TWENTY TO THIRTY;  Surgeon: [name], DMD;  Location: [facility];  Service: Oral Surgery;  Laterality:  N/A;   TOOTH EXTRACTION N/A 12/29/2020   Procedure: DENTAL RESTORATION/EXTRACTIONS;  Surgeon: [name], DMD;  Location: [facility];  Service: Dentistry;  Laterality: N/A;   TRANSTHORACIC ECHOCARDIOGRAM  02/22/2011   EF 55-65%; increased pattern of LVH with mild conc hypertrophy, abnormal relaxation & increased filling pressure (grade 2 diastolic dysfunction); atrial septum thickened (lipomatous hypertrophy)   UMBILICAL HERNIA REPAIR N/A 07/01/2016   Procedure: UMBILICAL HERNIA REPAIR WITH MESH;  Surgeon: [name], MD;  Location: [facility];  Service: General;  Laterality: N/A;   UPPER GASTROINTESTINAL ENDOSCOPY     VASECTOMY      There were no vitals filed for this visit.   Subjective Assessment - 06/27/21 0937     Subjective I walked 5 miles each of the last 3 days.  I used my walking stick.  I had a few balance losses but no falls.    Pertinent History on disability, has memory problems, liver cirrhosis, [place], osteoporosis with multiple fractures including C6, thrombocytopenia, gluacoma (loss of peripheral vision).  Has lost 75lb through walking but concerned about safety    How long can you walk comfortably? 5 miles with walking stick    Diagnostic tests C6 fracture and ligamentum flavum tear - non-surgical    Patient Stated Goals work on coming down and up stairs, balance    Currently in Pain? No/denies                               [facility] Adult PT Treatment/Exercise - 06/27/21 0001       Dynamic Standing Balance   Lateral lean/weight shifting comments: cross punches 5lb dumbbells x 20 alt Rt/Lt      Exercises   Exercises Lumbar;Knee/Hip;Shoulder      Lumbar Exercises: Aerobic   Nustep L5 x 8' seat 9 arms 10, PT present to monitor      Lumbar Exercises: Standing   Other Standing Lumbar Exercises farmer's carry 15lb 160' each side      Knee/Hip Exercises: Standing   Forward Step Up Both;2 sets;10 reps;Hand Hold: 1;Step Height: 6"    Forward Step Up  Limitations 1x10 leading with Lt, 1x10 leading with Rt                 Balance Exercises - 06/27/21 0001       Balance Exercises: Standing   Gait with Head Turns 4 reps;Forward;Cognitive challenge   PT called up/down/Lt/Rt for head positioning x 4 laps length of long hall in clinic, VC to focus on grounding feet in middle path vs drift with head turn   Sidestepping 5 reps   5 steps each, yellow  loop at ankles   Step Over Hurdles / Cones hurdle step over lat/fwd Rt/Lt with cognitive challenge of PT call out foot/direction x 4'    Heel Raises Both;20 reps   standing on blue foam with hands on wall   Toe Raise Both;20 reps   standing on blue foam with hands on wall   Sit to Stand Foam/compliant surface;Without upper extremity support   hold 10lb 2x10   Lift / Chop Both;20 reps   5lb dumbbell held in bil UEs                 PT Short Term Goals - 04/05/21 0926       PT SHORT TERM GOAL #1   Title Patient will complete 3 minute walk test using LRAD with only supervision to indicate decreased fall risk.    Status Achieved      PT SHORT TERM GOAL #2   Title Pt will verbalize and demo safe strategies for stair pattern (step to for descending) and gait patterns/pace (slow down) without AD    Status Achieved      PT SHORT TERM GOAL #3   Title PT and Pt will explore gait with walker and make decisions about appropriateness of use for improved safety.    Baseline using walking sticks, has had 1 fall since starting PT    Status Achieved      PT SHORT TERM GOAL #4   Title Pt ind with initial HEP    Status Achieved               PT Long Term Goals - 06/20/21 1103       PT LONG TERM GOAL #3   Title Pt will demo safety/no LOB with community ambulation for 6' walk test, curb up/down and grassy surface with supervision using least restrictive AD or no AD.    Status On-going                   Plan - 06/27/21 1127     Clinical Impression Statement Pt has been  walking 5 miles daily for the last 3 days using his walking stick.  He reported several instances of brief balance losses but no falls.  He continues to display improved control with gait with head movements, drifting only mildly and without balance loss.  PT placed foam pad under feet for weighted sit to stands today.  Pt was most challenged by hurdle multi-directional step overs, catching his foot on the return step Lt>Rt.  Close supervision and CGA provided intermittently throughout balance challenges today.  Continue along POC.    Comorbidities seizures, memory deficits, osteoporosis with Hx of multiple fractures    PT Frequency 2x / week    PT Duration 8 weeks    PT Treatment/Interventions ADLs/Self Care Home Management;Gait training;Stair training;Functional mobility training;Therapeutic activities;Therapeutic exercise;Balance training;Neuromuscular re-education;Manual techniques;Patient/family education;Passive range of motion    PT Next Visit Plan continue alt Lt/Rt tasks, cross body work with weight shifts, resisted retro walking green band, standing heel/toe raises    PT Home Exercise Plan [id number]    Consulted and Agree with Plan of Care Patient             Patient will benefit from skilled therapeutic intervention in order to improve the following deficits and impairments:     Visit Diagnosis: Other abnormalities of gait and mobility  Muscle weakness (generalized)  Repeated falls     Problem List Patient Active Problem List  Diagnosis Date Noted   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 05/11/2021   Liver cirrhosis secondary to NASH [facility])    GERD (gastroesophageal reflux disease)    Pancytopenia ([facility])    Osteomyelitis of jaw 12/29/2020   Acute osteomyelitis of mandible 12/26/2020   S/P inguinal hernia repair 01/14/2020   At high risk for falls    S/P shoulder surgery 03/05/2018   Excessive daytime sleepiness 02/18/2018   Crohn's disease of colon with rectal  bleeding ([place]) 02/18/2018   Blood in stool 02/18/2018   Iron deficiency anemia due to chronic blood loss 02/18/2018   Iron deficiency anemia due to sideropenic dysphagia 02/18/2018   Renal calculus, left 02/02/2018   History of sepsis 12/16/2017   Renal cyst, acquired, left 07/08/2017   Frequent falls 07/08/2017   Renal lesion 05/28/2017   MSSA bacteremia 05/06/2017   Confusion 04/02/2017   Syncope 08/30/2016   Depression 04/30/2016   Memory loss 04/30/2016   OSA on CPAP 10/31/2015   Nonepileptic episode ([place]) 04/25/2015   Seizure ([place]) 04/04/2013   Abnormal EKG 03/12/2013   DOE (dyspnea on exertion) 03/12/2013   Complex partial seizure ([place]) 04/19/2012   DM2 (diabetes mellitus, type 2) ([place]) 09/25/2011   OTHER DYSPHAGIA 03/27/2009   Cough 11/10/2008   GERD 12/07/2007   COLONIC POLYPS, ADENOMATOUS, HX OF 12/07/2007   EXTERNAL HEMORRHOIDS 09/11/2007   CROHN'S DISEASE, LARGE AND SMALL INTESTINES 09/11/2007   OSTEOPOROSIS 09/11/2007   HYPERLIPIDEMIA NEC/NOS 02/27/2007   Essential hypertension 02/27/2007    Baruch Merl, PT 06/27/21 11:32 AM   [facility] [place], [place], [address] Phone: [phone]   Fax:  [phone]  Name: [name] [name] MRN: [id number] Date of Birth: [birth year]

## 2021-06-28 ENCOUNTER — Ambulatory Visit
Admission: RE | Admit: 2021-06-28 | Discharge: 2021-06-28 | Disposition: A | Discharge status: Home / Self Care | Payer: Medicare Other | Source: Ambulatory Visit | Attending: Radiation Oncology | Admitting: Radiation Oncology

## 2021-06-28 ENCOUNTER — Other Ambulatory Visit: Payer: Self-pay

## 2021-06-28 DIAGNOSIS — M6281 Muscle weakness (generalized): Secondary | ICD-10-CM | POA: Diagnosis not present

## 2021-06-28 DIAGNOSIS — H532 Diplopia: Secondary | ICD-10-CM | POA: Diagnosis not present

## 2021-06-28 DIAGNOSIS — C44329 Squamous cell carcinoma of skin of other parts of face: Secondary | ICD-10-CM | POA: Diagnosis not present

## 2021-06-28 DIAGNOSIS — R279 Unspecified lack of coordination: Secondary | ICD-10-CM | POA: Diagnosis not present

## 2021-06-28 DIAGNOSIS — H04123 Dry eye syndrome of bilateral lacrimal glands: Secondary | ICD-10-CM | POA: Diagnosis not present

## 2021-06-28 DIAGNOSIS — H401234 Low-tension glaucoma, bilateral, indeterminate stage: Secondary | ICD-10-CM | POA: Diagnosis not present

## 2021-06-28 DIAGNOSIS — R296 Repeated falls: Secondary | ICD-10-CM | POA: Diagnosis not present

## 2021-06-28 DIAGNOSIS — R2689 Other abnormalities of gait and mobility: Secondary | ICD-10-CM | POA: Diagnosis not present

## 2021-06-29 ENCOUNTER — Ambulatory Visit (INDEPENDENT_AMBULATORY_CARE_PROVIDER_SITE_OTHER): Payer: Medicare Other | Admitting: Ophthalmology

## 2021-06-29 ENCOUNTER — Inpatient Hospital Stay: Payer: Medicare Other | Attending: Radiation Oncology | Admitting: Nutrition

## 2021-06-29 ENCOUNTER — Encounter (INDEPENDENT_AMBULATORY_CARE_PROVIDER_SITE_OTHER): Payer: Self-pay | Admitting: Ophthalmology

## 2021-06-29 ENCOUNTER — Ambulatory Visit
Admission: RE | Admit: 2021-06-29 | Discharge: 2021-06-29 | Disposition: A | Discharge status: Home / Self Care | Payer: Medicare Other | Source: Ambulatory Visit | Attending: Radiation Oncology | Admitting: Radiation Oncology

## 2021-06-29 ENCOUNTER — Telehealth: Payer: Self-pay | Admitting: Internal Medicine

## 2021-06-29 DIAGNOSIS — R279 Unspecified lack of coordination: Secondary | ICD-10-CM | POA: Diagnosis not present

## 2021-06-29 DIAGNOSIS — H33101 Unspecified retinoschisis, right eye: Secondary | ICD-10-CM | POA: Diagnosis not present

## 2021-06-29 DIAGNOSIS — I10 Essential (primary) hypertension: Secondary | ICD-10-CM

## 2021-06-29 DIAGNOSIS — Z961 Presence of intraocular lens: Secondary | ICD-10-CM

## 2021-06-29 DIAGNOSIS — H3321 Serous retinal detachment, right eye: Secondary | ICD-10-CM | POA: Diagnosis not present

## 2021-06-29 DIAGNOSIS — C44329 Squamous cell carcinoma of skin of other parts of face: Secondary | ICD-10-CM | POA: Insufficient documentation

## 2021-06-29 DIAGNOSIS — T66XXXA Radiation sickness, unspecified, initial encounter: Secondary | ICD-10-CM

## 2021-06-29 DIAGNOSIS — K7469 Other cirrhosis of liver: Secondary | ICD-10-CM | POA: Insufficient documentation

## 2021-06-29 DIAGNOSIS — H3589 Other specified retinal disorders: Secondary | ICD-10-CM

## 2021-06-29 DIAGNOSIS — R296 Repeated falls: Secondary | ICD-10-CM | POA: Diagnosis not present

## 2021-06-29 DIAGNOSIS — R2689 Other abnormalities of gait and mobility: Secondary | ICD-10-CM | POA: Diagnosis not present

## 2021-06-29 DIAGNOSIS — M6281 Muscle weakness (generalized): Secondary | ICD-10-CM | POA: Diagnosis not present

## 2021-06-29 DIAGNOSIS — Z923 Personal history of irradiation: Secondary | ICD-10-CM | POA: Insufficient documentation

## 2021-06-29 DIAGNOSIS — H401231 Low-tension glaucoma, bilateral, mild stage: Secondary | ICD-10-CM | POA: Diagnosis not present

## 2021-06-29 DIAGNOSIS — Z79899 Other long term (current) drug therapy: Secondary | ICD-10-CM | POA: Insufficient documentation

## 2021-06-29 DIAGNOSIS — K5090 Crohn's disease, unspecified, without complications: Secondary | ICD-10-CM | POA: Insufficient documentation

## 2021-06-29 DIAGNOSIS — H35033 Hypertensive retinopathy, bilateral: Secondary | ICD-10-CM | POA: Diagnosis not present

## 2021-06-29 NOTE — Progress Notes (Signed)
[facility] Note  06/29/2021     CHIEF COMPLAINT Patient presents for Retina Evaluation   HISTORY OF PRESENT ILLNESS: [name] [name] is a 61 y.o. male who presents to the clinic today for:   HPI     Retina Evaluation   In right eye.  This started 3 months ago.  Duration of 3 months.  Associated Symptoms Floaters.  I, the attending physician,  performed the HPI with the patient and updated documentation appropriately.        Comments   New patient referral from Dr. Lucianne Lei for shallow RD OD. Pt states he has been being treated/surgical procedures for squamous cell carcinoma on the right side of his face. The patient had sx to remove the cancer back in mid October which is when his visual issues started. Pt states there are times his vision is blurry but other times it seems totally fine. He does report floaters but no FOL. Pt wears OTC readers. Pt has had cataract sx OU. Pt has been diagnosed with low tension glaucoma which as resulted in peripheral vision loss. He reports taking Xalatan OU QHS. Pt is Type II DM. Most recent A1C was under 5.        Last edited by Bernarda Caffey, MD on 06/29/2021  9:57 AM.    Pt is here on the referral of Dr. Lucianne Lei for concern of shallow RD OD, pts wife states several years ago, he was dx with glaucoma by his regular optometrist and referred to [facility], pt has also been seen at [facility] by a neuro ophthalmologist to make sure that it was actually glaucoma and nothing else going on, they said it was glaucoma and he also has some atrophy of his optic nerve in both eyes, pt has squamous cell carcinoma and had sx with ENT at [facility] in October, wife states they had to do a lot of manipulation of the eye during sx, afterwards, he had double vision, but that has improved since October, pt is undergoing radiation on his right cheek and has 5 more sessions of to go (total of 30 sessions), pt has had more floaters over the past couple  of weeks, he had an eye exam with Dr. Lucianne Lei before he started radiation and had a follow up yesterday, she dilated him and thought maybe he had a "shallow detachment" in his right eye, pt is on latanoprost for glaucoma and has had cat sx  Referring physician: Lisabeth Pick, MD [place],  [place] [id number]  HISTORICAL INFORMATION:   Selected notes from the MEDICAL RECORD NUMBER Referred by Dr Lucianne Lei for concern of shallow RD OD LEE:  Ocular Hx- PMH-    CURRENT MEDICATIONS: Current Outpatient Medications (Ophthalmic Drugs)  Medication Sig   cycloSPORINE (RESTASIS) 0.05 % ophthalmic emulsion Place 1 drop into both eyes 2 (two) times daily.   latanoprost (XALATAN) 0.005 % ophthalmic solution Place 1 drop into both eyes at bedtime.    No current facility-administered medications for this visit. (Ophthalmic Drugs)   Current Outpatient Medications (Other)  Medication Sig   Calcium Carbonate-Vitamin D (CALCIUM 600+D PO) Take 1 tablet by mouth 2 (two) times daily.   chlorhexidine (PERIDEX) 0.12 % solution Use as directed 15 mLs in the mouth or throat in the morning, at noon, and at bedtime.   cholecalciferol (VITAMIN D) 1000 units tablet Take 1,000 Units at bedtime by mouth.    esomeprazole (NEXIUM) 40 MG  capsule Take 1 capsule (40 mg total) by mouth 2 (two) times daily before a meal.   Eszopiclone 3 MG TABS Take 3 mg by mouth at bedtime. Take immediately before bedtime   feeding supplement (ENSURE ENLIVE / ENSURE PLUS) LIQD Take 237 mLs by mouth 2 (two) times daily between meals.   folic acid (FOLVITE) 1 MG tablet Take 2 tablets (2 mg total) by mouth daily.   glucose blood (CHOICE DM FORA G20 TEST STRIPS) test strip Use as instructed   imiquimod (ALDARA) 5 % cream Apply 1 packet topically 2 (two) times a week.   Lactase (DAIRY RELIEF PO) Take 2 tablets by mouth in the morning and at bedtime.   lamoTRIgine (LAMICTAL) 150 MG tablet Take 1 tablet (150 mg total) by mouth 2 (two) times  daily.   mercaptopurine (PURINETHOL) 50 MG tablet TAKE 2 TABLETS DAILY ON AN               EMPTY STOMACH 1 HOUR BEFORE              OR 2 HOURS AFTER A MEAL.                 CAUTION: CHEMOTHERAPY. NEED APPOINTMENT.   mesalamine (PENTASA) 500 MG CR capsule Take 1,000 mg by mouth daily.   methscopolamine (PAMINE FORTE) 5 MG tablet Take 1 tablet (5 mg total) by mouth 2 (two) times daily.   Multiple Vitamin (MULTIVITAMIN WITH MINERALS) TABS tablet Take 1 tablet by mouth daily.   ONE TOUCH ULTRA TEST test strip USE AS INSTRUCTED   potassium chloride SA (KLOR-CON) 20 MEQ tablet Take 1 tablet (20 mEq total) by mouth 2 (two) times daily.   Probiotic Product (PROBIOTIC PO) Take 1 tablet at bedtime by mouth.    rifaximin (XIFAXAN) 550 MG TABS tablet Take 1 tablet (550 mg total) by mouth 2 (two) times daily.   sertraline (ZOLOFT) 100 MG tablet Take 200 mg daily with breakfast by mouth.    tolterodine (DETROL LA) 4 MG 24 hr capsule Take 4 mg by mouth daily.   traZODone (DESYREL) 150 MG tablet Take 150 mg by mouth at bedtime.   No current facility-administered medications for this visit. (Other)   Facility-Administered Medications Ordered in Other Visits (Other)  Medication Route   gadopentetate dimeglumine (MAGNEVIST) injection 20 mL Intravenous   REVIEW OF SYSTEMS: ROS   Positive for: Gastrointestinal, Neurological, Skin, Musculoskeletal, Endocrine, Eyes, Heme/Lymph Negative for: Constitutional, Genitourinary, HENT, Cardiovascular, Respiratory, Psychiatric, Allergic/Imm Last edited by Kingsley Spittle, COT on 06/29/2021  9:11 AM.     ALLERGIES Allergies  Allergen Reactions   Azithromycin Swelling   Beef-Derived Products Diarrhea    RED MEAT > UNSPECIFIED REACTION    Milk-Related Compounds Diarrhea    DAIRY >UNSPECIFIED REACTION    Penicillins Hives    Rash as child- tolerates Ancef/CHILDHOOD ALLERGY Has patient had a PCN reaction causing immediate rash, facial/tongue/throat swelling, SOB or  lightheadedness with hypotension: YES Has patient had a PCN reaction causing severe rash involving mucus membranes or skin necrosis: Unknown Has patient had a PCN reaction that required hospitalization: Unknown Has patient had a PCN reaction occurring within the last 10 years:Unknown If all of the above answers are "NO", then may proceed with Cephalosporin [place]   Sulfonamide Derivatives Hives   Dilaudid [Hydromorphone Hcl] Nausea And Vomiting    PAST MEDICAL HISTORY Past Medical History:  Diagnosis Date   Adenomatous colon polyp 10/1999   Anxiety    Arthritis  neck, yoga helps.   Cataract    Complex partial seizure ([place])    last seizure was 08-30-2018   Crohn's disease of small and large intestines ([place]) 1999   Depression    Diabetes mellitus    no meds at this time 10-24-17   Esophageal stricture    External hemorrhoids    GERD (gastroesophageal reflux disease)    Glaucoma    History of kidney stones    3 large stones still present   Hyperlipemia    Hypertension    resolved with weight loss    Inguinal hernia, bilateral 12/2019   Liver cirrhosis secondary to NASH ([place])    Migraines    Osteoporosis    PONV (postoperative nausea and vomiting)    PTSD (post-traumatic stress disorder)    Renal cyst, acquired, left 07/08/2017   Skin cancer    squamous cell multiple; [place]; followed every 3 months.   Sleep apnea    CPAP machine, uses nightly   Staphylococcus aureus bacteremia with sepsis ([place]) 05/28/2017   MRSA 2012   Past Surgical History:  Procedure Laterality Date   CATARACT EXTRACTION Bilateral    COLONOSCOPY     DEBRIDEMENT MANDIBLE N/A 12/07/2020   Procedure: DEBRIDEMENT OF MANDIBLE;  Surgeon: [name], DMD;  Location: [facility];  Service: Oral Surgery;  Laterality: N/A;   ELBOW SURGERY  2012   elbow MRSA infection    EYE SURGERY     cataracts removed, /w IOL   GROSS TEETH DEBRIDEMENT N/A 12/29/2020   Procedure: MANDIBULAR  DEBRIDEMENT;  Surgeon: [name], DMD;  Location: [facility];  Service: Dentistry;  Laterality: N/A;   INCISION AND DRAINAGE ABSCESS N/A 12/07/2020   Procedure: INCISION AND DRAINAGE MANDIBULAR ABSCESS;  Surgeon: [name], DMD;  Location: [facility];  Service: Oral Surgery;  Laterality: N/A;   INGUINAL HERNIA REPAIR Right 01/14/2020   Procedure: OPEN RIGHT HERNIA REPAIR INGUINAL WITH MESH;  Surgeon: [name], MD;  Location: [facility];  Service: General;  Laterality: Right;   INSERTION OF MESH N/A 07/01/2016   Procedure: INSERTION OF MESH;  Surgeon: [name], MD;  Location: [facility];  Service: General;  Laterality: N/A;   IR URETERAL STENT LEFT NEW ACCESS W/O SEP NEPHROSTOMY CATH  02/02/2018   NEPHROLITHOTOMY Left 02/02/2018   Procedure: NEPHROLITHOTOMY PERCUTANEOUS;  Surgeon: [name], MD;  Location: [facility];  Service: Urology;  Laterality: Left;   POLYPECTOMY     SCALP LACERATION REPAIR Right 10/16/2017   From fall/staples   SHOULDER ARTHROSCOPY Right 03/05/2018   Procedure: ARTHROSCOPY SHOULDER AND OPEN DISTAL CLAVICLE EXCISION;  Surgeon: [name], MD;  Location: [facility];  Service: Orthopedics;  Laterality: Right;   SHOULDER SURGERY     SINUS SURGERY WITH INSTATRAK     TEE WITHOUT CARDIOVERSION N/A 05/09/2017   Procedure: TRANSESOPHAGEAL ECHOCARDIOGRAM (TEE);  Surgeon: [name], MD;  [facility];  Service: Cardiovascular;  Laterality: N/A;   TOOTH EXTRACTION N/A 12/07/2020   Procedure: DENTAL EXTRACTIONS OF TEETH TWENTY TO THIRTY;  Surgeon: [name], DMD;  Location: [facility];  Service: Oral Surgery;  Laterality: N/A;   TOOTH EXTRACTION N/A 12/29/2020   Procedure: DENTAL RESTORATION/EXTRACTIONS;  Surgeon: [name], DMD;  Location: [facility];  Service: Dentistry;  Laterality: N/A;   TRANSTHORACIC ECHOCARDIOGRAM  02/22/2011   EF 55-65%; increased pattern of LVH with mild conc hypertrophy, abnormal relaxation & increased filling pressure (grade 2 diastolic dysfunction); atrial  septum thickened (  lipomatous hypertrophy)   UMBILICAL HERNIA REPAIR N/A 07/01/2016   Procedure: UMBILICAL HERNIA REPAIR WITH MESH;  Surgeon: [name], MD;  Location: [facility];  Service: General;  Laterality: N/A;   UPPER GASTROINTESTINAL ENDOSCOPY     VASECTOMY      FAMILY HISTORY Family History  Problem Relation Age of Onset   Heart disease Mother        CABG at age 38   Hyperlipidemia Mother    Hypertension Mother    Diabetes Father    Colon polyps Father    Stroke Father    Hyperlipidemia Father    Hypertension Father    Stroke Paternal Grandmother    Stroke Paternal Grandfather    Other Child        tetrology of fallot (cornealia deland syndrome)   Colon cancer Neg Hx    Esophageal cancer Neg Hx    Stomach cancer Neg Hx    Rectal cancer Neg Hx     SOCIAL HISTORY Social History   Tobacco Use   Smoking status: Never    Passive exposure: Never   Smokeless tobacco: Never  Vaping Use   Vaping Use: Never used  Substance Use Topics   Alcohol use: Never   Drug use: Never         OPHTHALMIC EXAM:  Base Eye Exam     Visual Acuity (Snellen - Linear)       Right Left   Dist [place] 20/30 20/60   Dist ph [place] NI 20/30 +1         Tonometry (Tonopen, 9:39 AM)       Right Left   Pressure 7 9         Pupils       Dark Light Shape React APD   Right 3 2 Round Brisk None   Left 3 2 Round Brisk None         Visual Fields       Left Right   Restrictions Partial outer superior temporal, inferior temporal, inferior nasal deficiencies Partial outer superior temporal, inferior temporal, superior nasal, inferior nasal deficiencies         Extraocular Movement       Right Left    Full, Nystagmus Full, Nystagmus         Neuro/Psych     Oriented x3: Yes   Mood/Affect: Normal         Dilation     Both eyes: 1.0% Mydriacyl, 2.5% Phenylephrine @ 9:39 AM           Slit Lamp and Fundus Exam     External Exam       Right Left   External SCC  right upper cheek          Slit Lamp Exam       Right Left   Lids/Lashes Dermatochalasis - upper lid Dermatochalasis - upper lid   Conjunctiva/Sclera White and quiet White and quiet   Cornea 1+ fine Punctate epithelial erosions Clear   Anterior Chamber Deep and quiet Deep and quiet   Iris Round and dilated Round and dilated   Lens Toric PC IOL in good position with marks at 0600 and 1200 PC IOL in good position, 1+ Posterior capsular opacification   Anterior Vitreous Vitreous syneresis Vitreous syneresis         Fundus Exam       Right Left   Disc Pink and Sharp, thin inferior rim mild Pallor, Sharp rim  C/D Ratio 0.6 0.5   Macula Flat, Good foveal reflex, mild RPE mottling, no heme Flat, Blunted foveal reflex, mild RPE mottling   Vessels mild attenuation, mild Copper wiring, mild tortuousity mild attenuation, mild Copper wiring, mild tortuousity   Periphery Elevation from 1100-1230 -- SRF vs schisis, pigmented pavingstone IT quad, pigmented CR scarring and pavingstone temporally Attached, CR scarring and atrophy / pavingstone ST / IT           Refraction     Manifest Refraction       Sphere Cylinder Dist VA   Right [place]  20/30   Left -1.00 Sphere 20/50            IMAGING AND PROCEDURES  Imaging and Procedures for 06/29/2021  OCT, Retina - OU - Both Eyes       Right Eye Quality was good. Central Foveal Thickness: 297. Progression has no prior data. Findings include normal foveal contour, no IRF, subretinal fluid (Focal SRF within schisis cavity -- schisis detachment superior periphery).   Left Eye Quality was good. Central Foveal Thickness: 290. Progression has no prior data. Findings include normal foveal contour, no IRF, no SRF (Mild cystic changes inferior macula).   Notes *Images captured and stored on drive  Diagnosis / Impression:  OD: Focal SRF within schisis cavity -- schisis detachment superior periphery OS: Mild cystic changes inferior  macula  Clinical management:  See below  Abbreviations: NFP - Normal foveal profile. CME - cystoid macular edema. PED - pigment epithelial detachment. IRF - intraretinal fluid. SRF - subretinal fluid. EZ - ellipsoid zone. ERM - epiretinal membrane. ORA - outer retinal atrophy. ORT - outer retinal tubulation. SRHM - subretinal hyper-reflective material. IRHM - intraretinal hyper-reflective material            ASSESSMENT/PLAN:    ICD-10-CM   1. Right retinoschisis  H33.101 OCT, Retina - OU - Both Eyes    CANCELED: OCT, Anterior Segment - OU - Both Eyes    2. Right retinal detachment  H33.21     3. 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  C44.329     4. Radiation retinopathy, initial encounter  T66.XXXA    H35.89     5. Essential hypertension  I10     6. Hypertensive retinopathy of both eyes  H35.033 OCT, Retina - OU - Both Eyes    7. Pseudophakia, both eyes  Z96.1     8. Low-tension glaucoma of both eyes, mild stage  H40.1231      1-4. Retinoschisis w/ focal detachment OD  - BCVA 20/30 - mild focal SRF within schisis cavity -- schisis detachment superior periphery, 04-1229 - no obvious retinal tear - history of squamous cell carcinoma of right cheek with extension along R infraorbital nerve - currently undergoing aggressive radiation therapy - suspect schisis RD secondary to radiation retinopathy - discussed findings and potential treatment options - recommend close observation for now - pt to complete radiation therapy next Thursday - if schisis RD related to radiation, may improve post radiation therapy  - f/u Monday, January 9 for DFE/OCT, check for progression  5,6. Hypertensive retinopathy OU - discussed importance of tight BP control - monitor   7. Pseudophakia OD  - s/p CE/IOL  - IOL in good position, doing well  - monitor  8. Low tension glaucoma  - managed by Dr. Lucianne Lei  - IOP: 07,09  - on latanoprost QHS OU   Ophthalmic Meds Ordered this  visit:  No  orders of the defined types were placed in this encounter.    Return for f/u Monday, January 9, retinoschisis OD, OCT.  There are no Patient Instructions on file for this visit.  Explained the diagnoses, plan, and follow up with the patient and they expressed understanding.  Patient expressed understanding of the importance of proper follow up care.   This document serves as a record of services personally performed by Gardiner Sleeper, MD, PhD. It was created on their behalf by San Jetty. Owens Shark, OA an ophthalmic technician. The creation of this record is the provider's dictation and/or activities during the visit.    Electronically signed by: San Jetty. Owens Shark, [place] 01.06.2023 4:21 PM  Gardiner Sleeper, M.D., Ph.D. Diseases & Surgery of the Retina and Vitreous [facility]  I have reviewed the above documentation for accuracy and completeness, and I agree with the above. Gardiner Sleeper, M.D., Ph.D. 07/01/21 4:21 PM  Abbreviations: M myopia (nearsighted); A astigmatism; H hyperopia (farsighted); P presbyopia; Mrx spectacle prescription;  CTL contact lenses; OD right eye; OS left eye; OU both eyes  XT exotropia; ET esotropia; PEK punctate epithelial keratitis; PEE punctate epithelial erosions; DES dry eye syndrome; MGD meibomian gland dysfunction; ATs artificial tears; PFAT's preservative free artificial tears; [place] nuclear sclerotic cataract; PSC posterior subcapsular cataract; ERM epi-retinal membrane; PVD posterior vitreous detachment; RD retinal detachment; DM diabetes mellitus; DR diabetic retinopathy; NPDR non-proliferative diabetic retinopathy; PDR proliferative diabetic retinopathy; CSME clinically significant macular edema; DME diabetic macular edema; dbh dot blot hemorrhages; CWS cotton wool spot; POAG primary open angle glaucoma; C/D cup-to-disc ratio; HVF humphrey visual field; GVF goldmann visual field; OCT optical coherence tomography; IOP intraocular  pressure; BRVO Branch retinal vein occlusion; CRVO central retinal vein occlusion; CRAO central retinal artery occlusion; BRAO branch retinal artery occlusion; RT retinal tear; SB scleral buckle; PPV pars plana vitrectomy; VH Vitreous hemorrhage; PRP panretinal laser photocoagulation; IVK intravitreal kenalog; VMT vitreomacular traction; MH Macular hole;  NVD neovascularization of the disc; NVE neovascularization elsewhere; AREDS age related eye disease study; ARMD age related macular degeneration; POAG primary open angle glaucoma; EBMD epithelial/anterior basement membrane dystrophy; ACIOL anterior chamber intraocular lens; IOL intraocular lens; PCIOL posterior chamber intraocular lens; Phaco/IOL phacoemulsification with intraocular lens placement; [place] photorefractive keratectomy; LASIK laser assisted in situ keratomileusis; HTN hypertension; DM diabetes mellitus; COPD chronic obstructive pulmonary disease

## 2021-06-29 NOTE — Telephone Encounter (Signed)
R/s pt's new pt appt per secure chat with Dr. Julien Nordmann and RN Hinton Dyer. Pt's wife is aware of new appt date and time.

## 2021-06-29 NOTE — Progress Notes (Signed)
Nutrition follow-up completed with patient who receives radiation therapy for SCC of right cheek.  Weight documented as 170.6 pounds January 3.  This is decreased from 174.8 pounds December 19.  Patient continues to eat 1 meal a day but has tried to snack a little.  He is drinking approximately 2 cartons of Ensure daily.  Continues to tolerate soft textures of foods.  He tries to walk his dog 4 to 5 miles a day and finds great joy in this.  Reports using baking soda and salt water rinses.  Nutrition diagnosis: Inadequate oral intake continues.  Intervention: Explained importance of adequate calories and protein for healing.  Encourage patient to strive for weight maintenance. Recommended increase Ensure Plus or equivalent to 3 cartons daily.  Provided additional coupons [place] samples.  Monitoring, evaluation, goals: Patient will tolerate increased calories and protein for weight maintenance.  Next visit: Thursday, January 12 before radiation therapy with Vinnie Level.  **Disclaimer: This note was dictated with voice recognition software. Similar sounding words can inadvertently be transcribed and this note may contain transcription errors which may not have been corrected upon publication of note.**

## 2021-07-02 ENCOUNTER — Other Ambulatory Visit: Payer: Self-pay

## 2021-07-02 ENCOUNTER — Encounter (INDEPENDENT_AMBULATORY_CARE_PROVIDER_SITE_OTHER): Payer: Self-pay | Admitting: Ophthalmology

## 2021-07-02 ENCOUNTER — Ambulatory Visit
Admission: RE | Admit: 2021-07-02 | Discharge: 2021-07-02 | Disposition: A | Discharge status: Home / Self Care | Payer: Medicare Other | Source: Ambulatory Visit | Attending: Radiation Oncology | Admitting: Radiation Oncology

## 2021-07-02 ENCOUNTER — Encounter: Payer: Medicare Other | Admitting: Rehabilitative and Restorative Service Providers"

## 2021-07-02 ENCOUNTER — Ambulatory Visit (INDEPENDENT_AMBULATORY_CARE_PROVIDER_SITE_OTHER): Payer: 59 | Admitting: Ophthalmology

## 2021-07-02 DIAGNOSIS — H3321 Serous retinal detachment, right eye: Secondary | ICD-10-CM | POA: Diagnosis not present

## 2021-07-02 DIAGNOSIS — H35033 Hypertensive retinopathy, bilateral: Secondary | ICD-10-CM

## 2021-07-02 DIAGNOSIS — Z961 Presence of intraocular lens: Secondary | ICD-10-CM

## 2021-07-02 DIAGNOSIS — R279 Unspecified lack of coordination: Secondary | ICD-10-CM | POA: Diagnosis not present

## 2021-07-02 DIAGNOSIS — H401231 Low-tension glaucoma, bilateral, mild stage: Secondary | ICD-10-CM | POA: Diagnosis not present

## 2021-07-02 DIAGNOSIS — I10 Essential (primary) hypertension: Secondary | ICD-10-CM | POA: Diagnosis not present

## 2021-07-02 DIAGNOSIS — H3589 Other specified retinal disorders: Secondary | ICD-10-CM

## 2021-07-02 DIAGNOSIS — M6281 Muscle weakness (generalized): Secondary | ICD-10-CM | POA: Diagnosis not present

## 2021-07-02 DIAGNOSIS — H33101 Unspecified retinoschisis, right eye: Secondary | ICD-10-CM

## 2021-07-02 DIAGNOSIS — C44329 Squamous cell carcinoma of skin of other parts of face: Secondary | ICD-10-CM | POA: Diagnosis not present

## 2021-07-02 DIAGNOSIS — R2689 Other abnormalities of gait and mobility: Secondary | ICD-10-CM | POA: Diagnosis not present

## 2021-07-02 DIAGNOSIS — R296 Repeated falls: Secondary | ICD-10-CM | POA: Diagnosis not present

## 2021-07-02 NOTE — Progress Notes (Signed)
[facility] Note  07/02/2021     CHIEF COMPLAINT Patient presents for Retina Follow Up   HISTORY OF PRESENT ILLNESS: [name] [name] is a 61 y.o. male who presents to the clinic today for:   HPI     Retina Follow Up   Patient presents with  Other.  In right eye.  This started 3 days ago.  I, the attending physician,  performed the HPI with the patient and updated documentation appropriately.        Comments   Patient here for 3 days retina follow up for retinoschisis OD. Patient states vision OD yesterday was cloudy for an hour. OD feels weak compared to OS. OS is good. Has eye pain. 2 - 3 times a day for 15 minutes OD throbs      Last edited by Bernarda Caffey, MD on 07/02/2021  1:05 PM.      Referring physician: [name], MD [address],  [place] [address]  HISTORICAL INFORMATION:   Selected notes from the MEDICAL RECORD NUMBER Referred by Dr Lucianne Lei for concern of shallow RD OD LEE:  Ocular Hx- PMH-    CURRENT MEDICATIONS: Current Outpatient Medications (Ophthalmic Drugs)  Medication Sig   cycloSPORINE (RESTASIS) 0.05 % ophthalmic emulsion Place 1 drop into both eyes 2 (two) times daily.   latanoprost (XALATAN) 0.005 % ophthalmic solution Place 1 drop into both eyes at bedtime.    No current facility-administered medications for this visit. (Ophthalmic Drugs)   Current Outpatient Medications (Other)  Medication Sig   Calcium Carbonate-Vitamin D (CALCIUM 600+D PO) Take 1 tablet by mouth 2 (two) times daily.   chlorhexidine (PERIDEX) 0.12 % solution Use as directed 15 mLs in the mouth or throat in the morning, at noon, and at bedtime.   cholecalciferol (VITAMIN D) 1000 units tablet Take 1,000 Units at bedtime by mouth.    esomeprazole (NEXIUM) 40 MG capsule Take 1 capsule (40 mg total) by mouth 2 (two) times daily before a meal.   Eszopiclone 3 MG TABS Take 3 mg by mouth at bedtime. Take immediately before  bedtime   feeding supplement (ENSURE ENLIVE / ENSURE PLUS) LIQD Take 237 mLs by mouth 2 (two) times daily between meals.   folic acid (FOLVITE) 1 MG tablet Take 2 tablets (2 mg total) by mouth daily.   glucose blood (CHOICE DM FORA G20 TEST STRIPS) test strip Use as instructed   imiquimod (ALDARA) 5 % cream Apply 1 packet topically 2 (two) times a week.   Lactase (DAIRY RELIEF PO) Take 2 tablets by mouth in the morning and at bedtime.   lamoTRIgine (LAMICTAL) 150 MG tablet Take 1 tablet (150 mg total) by mouth 2 (two) times daily.   mercaptopurine (PURINETHOL) 50 MG tablet TAKE 2 TABLETS DAILY ON AN               EMPTY STOMACH 1 HOUR BEFORE              OR 2 HOURS AFTER A MEAL.                 CAUTION: CHEMOTHERAPY. NEED APPOINTMENT.   mesalamine (PENTASA) 500 MG CR capsule Take 1,000 mg by mouth daily.   methscopolamine (PAMINE FORTE) 5 MG tablet Take 1 tablet (5 mg total) by mouth 2 (two) times daily.   Multiple Vitamin (MULTIVITAMIN WITH MINERALS) TABS tablet Take 1 tablet by mouth daily.   ONE TOUCH ULTRA  TEST test strip USE AS INSTRUCTED   potassium chloride SA (KLOR-CON) 20 MEQ tablet Take 1 tablet (20 mEq total) by mouth 2 (two) times daily.   Probiotic Product (PROBIOTIC PO) Take 1 tablet at bedtime by mouth.    rifaximin (XIFAXAN) 550 MG TABS tablet Take 1 tablet (550 mg total) by mouth 2 (two) times daily.   sertraline (ZOLOFT) 100 MG tablet Take 200 mg daily with breakfast by mouth.    tolterodine (DETROL LA) 4 MG 24 hr capsule Take 4 mg by mouth daily.   traZODone (DESYREL) 150 MG tablet Take 150 mg by mouth at bedtime.   No current facility-administered medications for this visit. (Other)   Facility-Administered Medications Ordered in Other Visits (Other)  Medication Route   gadopentetate dimeglumine (MAGNEVIST) injection 20 mL Intravenous   REVIEW OF SYSTEMS: ROS   Positive for: Gastrointestinal, Neurological, Skin, Musculoskeletal, Endocrine, Eyes, Heme/Lymph Negative for:  Constitutional, Genitourinary, HENT, Cardiovascular, Respiratory, Psychiatric, Allergic/Imm Last edited by Theodore Demark, COA on 07/02/2021 10:10 AM.     ALLERGIES Allergies  Allergen Reactions   Azithromycin Swelling   Beef-Derived Products Diarrhea    RED MEAT > UNSPECIFIED REACTION    Milk-Related Compounds Diarrhea    DAIRY >UNSPECIFIED REACTION    Penicillins Hives    Rash as child- tolerates Ancef/CHILDHOOD ALLERGY Has patient had a PCN reaction causing immediate rash, facial/tongue/throat swelling, SOB or lightheadedness with hypotension: YES Has patient had a PCN reaction causing severe rash involving mucus membranes or skin necrosis: Unknown Has patient had a PCN reaction that required hospitalization: Unknown Has patient had a PCN reaction occurring within the last 10 years:Unknown If all of the above answers are "NO", then may proceed with Cephalosporin [place]   Sulfonamide Derivatives Hives   Dilaudid [Hydromorphone Hcl] Nausea And Vomiting    PAST MEDICAL HISTORY Past Medical History:  Diagnosis Date   Adenomatous colon polyp 10/1999   Anxiety    Arthritis    neck, yoga helps.   Cataract    Complex partial seizure ([place])    last seizure was 08-30-2018   Crohn's disease of small and large intestines ([place]) 1999   Depression    Diabetes mellitus    no meds at this time 10-24-17   Esophageal stricture    External hemorrhoids    GERD (gastroesophageal reflux disease)    Glaucoma    History of kidney stones    3 large stones still present   Hyperlipemia    Hypertension    resolved with weight loss    Inguinal hernia, bilateral 12/2019   Liver cirrhosis secondary to NASH ([place])    Migraines    Osteoporosis    PONV (postoperative nausea and vomiting)    PTSD (post-traumatic stress disorder)    Renal cyst, acquired, left 07/08/2017   Skin cancer    squamous cell multiple; [facility]; followed every 3 months.   Sleep apnea    CPAP machine, uses nightly    Staphylococcus aureus bacteremia with sepsis ([place]) 05/28/2017   MRSA 2012   Past Surgical History:  Procedure Laterality Date   CATARACT EXTRACTION Bilateral    COLONOSCOPY     DEBRIDEMENT MANDIBLE N/A 12/07/2020   Procedure: DEBRIDEMENT OF MANDIBLE;  Surgeon: [name], DMD;  Location: [facility];  Service: Oral Surgery;  Laterality: N/A;   ELBOW SURGERY  2012   elbow MRSA infection    EYE SURGERY     cataracts removed, /w IOL   GROSS TEETH DEBRIDEMENT N/A  12/29/2020   Procedure: MANDIBULAR  DEBRIDEMENT;  Surgeon: [name], DMD;  Location: [facility];  Service: Dentistry;  Laterality: N/A;   INCISION AND DRAINAGE ABSCESS N/A 12/07/2020   Procedure: INCISION AND DRAINAGE MANDIBULAR ABSCESS;  Surgeon: [name], DMD;  Location: [facility];  Service: Oral Surgery;  Laterality: N/A;   INGUINAL HERNIA REPAIR Right 01/14/2020   Procedure: OPEN RIGHT HERNIA REPAIR INGUINAL WITH MESH;  Surgeon: [name], MD;  Location: [facility];  Service: General;  Laterality: Right;   INSERTION OF MESH N/A 07/01/2016   Procedure: INSERTION OF MESH;  Surgeon: [name], MD;  Location: [facility];  Service: General;  Laterality: N/A;   IR URETERAL STENT LEFT NEW ACCESS W/O SEP NEPHROSTOMY CATH  02/02/2018   NEPHROLITHOTOMY Left 02/02/2018   Procedure: NEPHROLITHOTOMY PERCUTANEOUS;  Surgeon: [name], MD;  Location: [facility];  Service: Urology;  Laterality: Left;   POLYPECTOMY     SCALP LACERATION REPAIR Right 10/16/2017   From fall/staples   SHOULDER ARTHROSCOPY Right 03/05/2018   Procedure: ARTHROSCOPY SHOULDER AND OPEN DISTAL CLAVICLE EXCISION;  Surgeon: [name], MD;  Location: [facility];  Service: Orthopedics;  Laterality: Right;   SHOULDER SURGERY     SINUS SURGERY WITH INSTATRAK     TEE WITHOUT CARDIOVERSION N/A 05/09/2017   Procedure: TRANSESOPHAGEAL ECHOCARDIOGRAM (TEE);  Surgeon: [name], MD;  Location: [facility];  Service: Cardiovascular;  Laterality: N/A;   TOOTH  EXTRACTION N/A 12/07/2020   Procedure: DENTAL EXTRACTIONS OF TEETH TWENTY TO THIRTY;  Surgeon: [name], DMD;  Location: [facility];  Service: Oral Surgery;  Laterality: N/A;   TOOTH EXTRACTION N/A 12/29/2020   Procedure: DENTAL RESTORATION/EXTRACTIONS;  Surgeon: [name], DMD;  Location: [facility];  Service: Dentistry;  Laterality: N/A;   TRANSTHORACIC ECHOCARDIOGRAM  02/22/2011   EF 55-65%; increased pattern of LVH with mild conc hypertrophy, abnormal relaxation & increased filling pressure (grade 2 diastolic dysfunction); atrial septum thickened (lipomatous hypertrophy)   UMBILICAL HERNIA REPAIR N/A 07/01/2016   Procedure: UMBILICAL HERNIA REPAIR WITH MESH;  Surgeon: [name], MD;  Location: [facility];  Service: General;  Laterality: N/A;   UPPER GASTROINTESTINAL ENDOSCOPY     VASECTOMY      FAMILY HISTORY Family History  Problem Relation Age of Onset   Heart disease Mother        CABG at age 31   Hyperlipidemia Mother    Hypertension Mother    Diabetes Father    Colon polyps Father    Stroke Father    Hyperlipidemia Father    Hypertension Father    Stroke Paternal Grandmother    Stroke Paternal Grandfather    Other Child        tetrology of fallot (cornealia deland syndrome)   Colon cancer Neg Hx    Esophageal cancer Neg Hx    Stomach cancer Neg Hx    Rectal cancer Neg Hx     SOCIAL HISTORY Social History   Tobacco Use   Smoking status: Never    Passive exposure: Never   Smokeless tobacco: Never  Vaping Use   Vaping Use: Never used  Substance Use Topics   Alcohol use: Never   Drug use: Never       OPHTHALMIC EXAM:  Base Eye Exam     Visual Acuity (Snellen - Linear)       Right Left   Dist [place] 20/30 20/60 -1   Dist ph [place] NI 20/40 +1  Tonometry (Tonopen, 10:05 AM)       Right Left   Pressure 12 10         Pupils       Dark Light Shape React APD   Right 3 2 Round Brisk None   Left 3 2 Round Brisk None         Visual Fields        Left Right   Restrictions Partial outer superior temporal, inferior temporal, inferior nasal deficiencies Partial outer superior temporal, inferior temporal, superior nasal, inferior nasal deficiencies         Extraocular Movement       Right Left    Full Full         Neuro/Psych     Oriented x3: Yes   Mood/Affect: Normal         Dilation     Both eyes: 1.0% Mydriacyl, 2.5% Phenylephrine @ 10:05 AM           Slit Lamp and Fundus Exam     External Exam       Right Left   External SCC right upper cheek          Slit Lamp Exam       Right Left   Lids/Lashes Dermatochalasis - upper lid Dermatochalasis - upper lid   Conjunctiva/Sclera White and quiet White and quiet   Cornea 1+ fine Punctate epithelial erosions, tear film debris Clear   Anterior Chamber Deep and quiet Deep and quiet   Iris Round and dilated Round and dilated   Lens Toric PC IOL in good position with marks at 0600 and 1200 PC IOL in good position, 1+ Posterior capsular opacification   Anterior Vitreous Vitreous syneresis Vitreous syneresis         Fundus Exam       Right Left   Disc Pink and Sharp, thin inferior rim mild Pallor, Sharp rim   C/D Ratio 0.6 0.5   Macula Flat, Good foveal reflex, mild RPE mottling, no heme Flat, Blunted foveal reflex, mild RPE mottling   Vessels mild attenuation, mild Copper wiring, mild tortuousity mild attenuation, mild Copper wiring, mild tortuousity   Periphery Elevation from 1100-1230 --  schisis with focal pocket of SRF posteriorly(schisis RD, no RT on scleral depression), pigmented pavingstone IT quad, pigmented CR scarring and pavingstone temporally Attached, CR scarring and atrophy / pavingstone ST / IT            IMAGING AND PROCEDURES  Imaging and Procedures for 07/02/2021  OCT, Retina - OU - Both Eyes       Right Eye Quality was good. Central Foveal Thickness: 302. Progression has been stable. Findings include normal foveal contour,  no IRF, subretinal fluid (Focal SRF within schisis cavity -- schisis detachment superior periphery--stable from prior).   Left Eye Quality was good. Central Foveal Thickness: 288. Progression has been stable. Findings include normal foveal contour, no IRF, no SRF (Mild cystic changes inferior macula).   Notes *Images captured and stored on drive  Diagnosis / Impression:  OD: Focal SRF within schisis cavity -- schisis detachment superior periphery--stable from prior OS: Mild cystic changes inferior macula  Clinical management:  See below  Abbreviations: NFP - Normal foveal profile. CME - cystoid macular edema. PED - pigment epithelial detachment. IRF - intraretinal fluid. SRF - subretinal fluid. EZ - ellipsoid zone. ERM - epiretinal membrane. ORA - outer retinal atrophy. ORT - outer retinal tubulation. SRHM - subretinal hyper-reflective material. IRHM -  intraretinal hyper-reflective material             ASSESSMENT/PLAN:    ICD-10-CM   1. Right retinoschisis  H33.101 OCT, Retina - OU - Both Eyes    2. Right retinal detachment  H33.21 OCT, Retina - OU - Both Eyes    3. 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  C44.329     4. Radiation retinopathy, initial encounter  T66.XXXA    H35.89     5. Essential hypertension  I10     6. Hypertensive retinopathy of both eyes  H35.033     7. Pseudophakia, both eyes  Z96.1     8. Low-tension glaucoma of both eyes, mild stage  H40.1231       1-4. Retinoschisis w/ focal detachment OD -- stable  - BCVA 20/30 - mild focal SRF within schisis cavity -- schisis detachment superior periphery, 04-1229 - no retinal tear on scleral depression - history of squamous cell carcinoma of right cheek with extension along R infraorbital nerve - currently undergoing aggressive radiation therapy, set complete therapy this Thursday - suspect schisis RD secondary to radiation retinopathy - discussed findings and potential treatment options  including laser retinopexy and observation - recommend close observation for now - pt to complete radiation therapy on Thursday - if schisis RD related to radiation, may improve post radiation therapy  - f/u 1 week, DFE/OCT  5,6. Hypertensive retinopathy OU - discussed importance of tight BP control - monitor   7. Pseudophakia OD  - s/p CE/IOL  - IOL in good position, doing well  - monitor  8. Low tension glaucoma  - managed by Dr. Lucianne Lei  - IOP: 12,10  - on latanoprost qhs OU   Ophthalmic Meds Ordered this visit:  No orders of the defined types were placed in this encounter.    Return 1 week DFE, OCT.  There are no Patient Instructions on file for this visit.  Explained the diagnoses, plan, and follow up with the patient and they expressed understanding.  Patient expressed understanding of the importance of proper follow up care.   This document serves as a record of services personally performed by Gardiner Sleeper, MD, PhD. It was created on their behalf by Roselee Nova, COMT. The creation of this record is the provider's dictation and/or activities during the visit.  Electronically signed by: Roselee Nova, COMT 07/02/21 1:06 PM  Gardiner Sleeper, M.D., Ph.D. Diseases & Surgery of the Retina and [place] 07/02/2021  I have reviewed the above documentation for accuracy and completeness, and I agree with the above. Gardiner Sleeper, M.D., Ph.D. 07/02/21 1:08 PM   Abbreviations: M myopia (nearsighted); A astigmatism; H hyperopia (farsighted); P presbyopia; Mrx spectacle prescription;  CTL contact lenses; OD right eye; OS left eye; OU both eyes  XT exotropia; ET esotropia; PEK punctate epithelial keratitis; PEE punctate epithelial erosions; DES dry eye syndrome; MGD meibomian gland dysfunction; ATs artificial tears; PFAT's preservative free artificial tears; [place] nuclear sclerotic cataract; PSC posterior subcapsular cataract; ERM epi-retinal  membrane; PVD posterior vitreous detachment; RD retinal detachment; DM diabetes mellitus; DR diabetic retinopathy; NPDR non-proliferative diabetic retinopathy; PDR proliferative diabetic retinopathy; CSME clinically significant macular edema; DME diabetic macular edema; dbh dot blot hemorrhages; CWS cotton wool spot; POAG primary open angle glaucoma; C/D cup-to-disc ratio; HVF humphrey visual field; GVF goldmann visual field; OCT optical coherence tomography; IOP intraocular pressure; BRVO Branch retinal vein occlusion; CRVO central retinal vein occlusion;  CRAO central retinal artery occlusion; BRAO branch retinal artery occlusion; RT retinal tear; SB scleral buckle; PPV pars plana vitrectomy; VH Vitreous hemorrhage; PRP panretinal laser photocoagulation; IVK intravitreal kenalog; VMT vitreomacular traction; MH Macular hole;  NVD neovascularization of the disc; NVE neovascularization elsewhere; AREDS age related eye disease study; ARMD age related macular degeneration; POAG primary open angle glaucoma; EBMD epithelial/anterior basement membrane dystrophy; ACIOL anterior chamber intraocular lens; IOL intraocular lens; PCIOL posterior chamber intraocular lens; Phaco/IOL phacoemulsification with intraocular lens placement; [place] photorefractive keratectomy; LASIK laser assisted in situ keratomileusis; HTN hypertension; DM diabetes mellitus; COPD chronic obstructive pulmonary disease

## 2021-07-03 ENCOUNTER — Encounter: Payer: Medicare Other | Admitting: Nutrition

## 2021-07-03 ENCOUNTER — Ambulatory Visit
Admission: RE | Admit: 2021-07-03 | Discharge: 2021-07-03 | Disposition: A | Discharge status: Home / Self Care | Payer: Medicare Other | Source: Ambulatory Visit | Attending: Radiation Oncology | Admitting: Radiation Oncology

## 2021-07-03 DIAGNOSIS — R279 Unspecified lack of coordination: Secondary | ICD-10-CM | POA: Diagnosis not present

## 2021-07-03 DIAGNOSIS — M6281 Muscle weakness (generalized): Secondary | ICD-10-CM | POA: Diagnosis not present

## 2021-07-03 DIAGNOSIS — R296 Repeated falls: Secondary | ICD-10-CM | POA: Diagnosis not present

## 2021-07-03 DIAGNOSIS — R2689 Other abnormalities of gait and mobility: Secondary | ICD-10-CM | POA: Diagnosis not present

## 2021-07-03 DIAGNOSIS — C44329 Squamous cell carcinoma of skin of other parts of face: Secondary | ICD-10-CM | POA: Diagnosis not present

## 2021-07-04 ENCOUNTER — Other Ambulatory Visit: Payer: Self-pay

## 2021-07-04 ENCOUNTER — Encounter: Payer: Self-pay | Admitting: Physical Therapy

## 2021-07-04 ENCOUNTER — Ambulatory Visit: Payer: Medicare Other | Admitting: Physical Therapy

## 2021-07-04 ENCOUNTER — Ambulatory Visit
Admission: RE | Admit: 2021-07-04 | Discharge: 2021-07-04 | Disposition: A | Discharge status: Home / Self Care | Payer: Medicare Other | Source: Ambulatory Visit | Attending: Radiation Oncology | Admitting: Radiation Oncology

## 2021-07-04 DIAGNOSIS — R296 Repeated falls: Secondary | ICD-10-CM

## 2021-07-04 DIAGNOSIS — R2689 Other abnormalities of gait and mobility: Secondary | ICD-10-CM | POA: Diagnosis not present

## 2021-07-04 DIAGNOSIS — M6281 Muscle weakness (generalized): Secondary | ICD-10-CM | POA: Diagnosis not present

## 2021-07-04 DIAGNOSIS — R279 Unspecified lack of coordination: Secondary | ICD-10-CM | POA: Diagnosis not present

## 2021-07-04 DIAGNOSIS — C44329 Squamous cell carcinoma of skin of other parts of face: Secondary | ICD-10-CM | POA: Diagnosis not present

## 2021-07-04 NOTE — Therapy (Signed)
[facility] @ [facility] [place], [place], [address] Phone: [phone]   Fax:  [phone]  Physical Therapy Treatment  Patient Details  Name: [name] [name] MRN: [id number] Date of Birth: Sep 20, 1960 Referring Provider (PT): [name], MD   Encounter Date: 07/04/2021   PT End of Session - 07/04/21 0942     Visit Number 26    Date for PT Re-Evaluation 07/12/21    Authorization Type [employer] Medicare    Authorization - Visit Number 26    Authorization - Number of Visits 60    Progress Note Due on Visit 30    PT Start Time 0935   Pt 5 min late   PT Stop Time 1016    PT Time Calculation (min) 41 min    Activity Tolerance Patient tolerated treatment well    Behavior During Therapy [facility] for tasks assessed/performed             Past Medical History:  Diagnosis Date   Adenomatous colon polyp 10/1999   Anxiety    Arthritis    neck, yoga helps.   Cataract    Complex partial seizure ([place])    last seizure was 08-30-2018   Crohn's disease of small and large intestines ([place]) 1999   Depression    Diabetes mellitus    no meds at this time 10-24-17   Esophageal stricture    External hemorrhoids    GERD (gastroesophageal reflux disease)    Glaucoma    History of kidney stones    3 large stones still present   Hyperlipemia    Hypertension    resolved with weight loss    Inguinal hernia, bilateral 12/2019   Liver cirrhosis secondary to NASH ([place])    Migraines    Osteoporosis    PONV (postoperative nausea and vomiting)    PTSD (post-traumatic stress disorder)    Renal cyst, acquired, left 07/08/2017   Skin cancer    squamous cell multiple; [place]; followed every 3 months.   Sleep apnea    CPAP machine, uses nightly   Staphylococcus aureus bacteremia with sepsis ([place]) 05/28/2017   MRSA 2012    Past Surgical History:  Procedure Laterality Date   CATARACT EXTRACTION Bilateral    COLONOSCOPY     DEBRIDEMENT  MANDIBLE N/A 12/07/2020   Procedure: DEBRIDEMENT OF MANDIBLE;  Surgeon: [name], DMD;  Location: [facility];  Service: Oral Surgery;  Laterality: N/A;   ELBOW SURGERY  2012   elbow MRSA infection    EYE SURGERY     cataracts removed, /w IOL   GROSS TEETH DEBRIDEMENT N/A 12/29/2020   Procedure: MANDIBULAR  DEBRIDEMENT;  Surgeon: [name], DMD;  Location: [facility];  Service: Dentistry;  Laterality: N/A;   INCISION AND DRAINAGE ABSCESS N/A 12/07/2020   Procedure: INCISION AND DRAINAGE MANDIBULAR ABSCESS;  Surgeon: [name], DMD;  Location: [facility];  Service: Oral Surgery;  Laterality: N/A;   INGUINAL HERNIA REPAIR Right 01/14/2020   Procedure: OPEN RIGHT HERNIA REPAIR INGUINAL WITH MESH;  Surgeon: [name], MD;  Location: [facility];  Service: General;  Laterality: Right;   INSERTION OF MESH N/A 07/01/2016   Procedure: INSERTION OF MESH;  Surgeon: [name], MD;  Location: [facility];  Service: General;  Laterality: N/A;   IR URETERAL STENT LEFT NEW ACCESS W/O SEP NEPHROSTOMY CATH  02/02/2018   NEPHROLITHOTOMY Left 02/02/2018   Procedure: NEPHROLITHOTOMY PERCUTANEOUS;  Surgeon: [name], MD;  Location: [facility];  Service: Urology;  Laterality: Left;   POLYPECTOMY     SCALP LACERATION REPAIR Right 10/16/2017   From fall/staples   SHOULDER ARTHROSCOPY Right 03/05/2018   Procedure: ARTHROSCOPY SHOULDER AND OPEN DISTAL CLAVICLE EXCISION;  Surgeon: [name], MD;  Location: [facility];  Service: Orthopedics;  Laterality: Right;   SHOULDER SURGERY     SINUS SURGERY WITH INSTATRAK     TEE WITHOUT CARDIOVERSION N/A 05/09/2017   Procedure: TRANSESOPHAGEAL ECHOCARDIOGRAM (TEE);  Surgeon: [name], MD;  Location: [facility];  Service: Cardiovascular;  Laterality: N/A;   TOOTH EXTRACTION N/A 12/07/2020   Procedure: DENTAL EXTRACTIONS OF TEETH TWENTY TO THIRTY;  Surgeon: [name], DMD;  Location: [facility];  Service: Oral Surgery;  Laterality: N/A;   TOOTH EXTRACTION N/A  12/29/2020   Procedure: DENTAL RESTORATION/EXTRACTIONS;  Surgeon: [name], DMD;  Location: [facility];  Service: Dentistry;  Laterality: N/A;   TRANSTHORACIC ECHOCARDIOGRAM  02/22/2011   EF 55-65%; increased pattern of LVH with mild conc hypertrophy, abnormal relaxation & increased filling pressure (grade 2 diastolic dysfunction); atrial septum thickened (lipomatous hypertrophy)   UMBILICAL HERNIA REPAIR N/A 07/01/2016   Procedure: UMBILICAL HERNIA REPAIR WITH MESH;  Surgeon: [name], MD;  Location: [facility];  Service: General;  Laterality: N/A;   UPPER GASTROINTESTINAL ENDOSCOPY     VASECTOMY      There were no vitals filed for this visit.   Subjective Assessment - 07/04/21 0941     Subjective I was totalled from radiation yesterday and had to go to bed afterwards.  I was fine beforehand.    Pertinent History on disability, has memory problems, liver cirrhosis, [place], osteoporosis with multiple fractures including C6, thrombocytopenia, gluacoma (loss of peripheral vision).  Has lost 75lb through walking but concerned about safety    How long can you walk comfortably? 5 miles with walking stick    Diagnostic tests C6 fracture and ligamentum flavum tear - non-surgical    Patient Stated Goals work on coming down and up stairs, balance    Currently in Pain? No/denies                               [facility] Adult PT Treatment/Exercise - 07/04/21 0001       Exercises   Exercises Lumbar;Knee/Hip;Shoulder      Lumbar Exercises: Aerobic   Nustep L5 x 10' seat 10 arms 11 PT present to discuss status and monitor      Knee/Hip Exercises: Standing   Walking with Sports Cord blue tband bwd walking x 10 5" holds    Gait Training farmer's carry Rt and Lt 10lb each x 300'      Shoulder Exercises: Standing   Row Strengthening;Both;15 reps;Theraband    Theraband Level (Shoulder Row) Level 4 (Blue)                 Balance Exercises - 07/04/21 0001       Balance  Exercises: Standing   Gait with Head Turns 4 reps;Forward;Cognitive challenge   PT called up/down/Lt/Rt for head positioning x 4 laps length of long hall in clinic, VC to focus on grounding feet in middle path vs drift with head turn   Sidestepping 5 reps   5 steps each, yellow loop at ankles   Step Over Hurdles / [place]es hurdle step over lat/fwd Rt/Lt with cognitive challenge of PT call out foot/direction x 4'  Heel Raises Both;20 reps   standing on blue foam with hands on wall   Toe Raise Both;20 reps   standing on blue foam with hands on wall   Sit to Stand Foam/compliant surface;Without upper extremity support   hold 10lb 2x10   Lift / Chop Both;20 reps   5lb dumbbell held in bil UEs                 PT Short Term Goals - 04/05/21 0926       PT SHORT TERM GOAL #1   Title Patient will complete 3 minute walk test using LRAD with only supervision to indicate decreased fall risk.    Status Achieved      PT SHORT TERM GOAL #2   Title Pt will verbalize and demo safe strategies for stair pattern (step to for descending) and gait patterns/pace (slow down) without AD    Status Achieved      PT SHORT TERM GOAL #3   Title PT and Pt will explore gait with walker and make decisions about appropriateness of use for improved safety.    Baseline using walking sticks, has had 1 fall since starting PT    Status Achieved      PT SHORT TERM GOAL #4   Title Pt ind with initial HEP    Status Achieved               PT Long Term Goals - 06/20/21 1103       PT LONG TERM GOAL #3   Title Pt will demo safety/no LOB with community ambulation for 6' walk test, curb up/down and grassy surface with supervision using least restrictive AD or no AD.    Status On-going                   Plan - 07/04/21 1233     Clinical Impression Statement Pt continues to work hard on coordination, strength, and balance.  He increased farmer carry to 300' each side today and continues to show good  control with standing ther ex on balance pad.  He continues to undergo radiation for skin cancer but only has 2 treatments left.  He grows very fatigued with radiation and is resting at home more often but is able to fully participate within sessions with good work rate.  Close supervision provided by PT throughout session for safety.    Comorbidities seizures, memory deficits, osteoporosis with Hx of multiple fractures    Rehab Potential Good    PT Frequency 2x / week    PT Duration 8 weeks    PT Treatment/Interventions ADLs/Self Care Home Management;Gait training;Stair training;Functional mobility training;Therapeutic activities;Therapeutic exercise;Balance training;Neuromuscular re-education;Manual techniques;Patient/family education;Passive range of motion    PT Next Visit Plan continue alt Lt/Rt tasks, cross body work with weight shifts, resisted retro walking green band, standing heel/toe raises    PT Home Exercise Plan [id number]    Consulted and Agree with Plan of Care Patient             Patient will benefit from skilled therapeutic intervention in order to improve the following deficits and impairments:     Visit Diagnosis: Other abnormalities of gait and mobility  Muscle weakness (generalized)  Repeated falls     Problem List Patient Active Problem List   Diagnosis Date Noted   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 05/11/2021   Liver cirrhosis secondary to NASH ([facility])    GERD (gastroesophageal reflux disease)  Pancytopenia ([place])    Osteomyelitis of jaw 12/29/2020   Acute osteomyelitis of mandible 12/26/2020   S/P inguinal hernia repair 01/14/2020   At high risk for falls    S/P shoulder surgery 03/05/2018   Excessive daytime sleepiness 02/18/2018   Crohn's disease of colon with rectal bleeding ([place]) 02/18/2018   Blood in stool 02/18/2018   Iron deficiency anemia due to chronic blood loss 02/18/2018   Iron deficiency anemia due to sideropenic dysphagia  02/18/2018   Renal calculus, left 02/02/2018   History of sepsis 12/16/2017   Renal cyst, acquired, left 07/08/2017   Frequent falls 07/08/2017   Renal lesion 05/28/2017   MSSA bacteremia 05/06/2017   Confusion 04/02/2017   Syncope 08/30/2016   Depression 04/30/2016   Memory loss 04/30/2016   OSA on CPAP 10/31/2015   Nonepileptic episode ([place]) 04/25/2015   Seizure ([place]) 04/04/2013   Abnormal EKG 03/12/2013   DOE (dyspnea on exertion) 03/12/2013   Complex partial seizure ([place]) 04/19/2012   DM2 (diabetes mellitus, type 2) ([place]) 09/25/2011   OTHER DYSPHAGIA 03/27/2009   Cough 11/10/2008   GERD 12/07/2007   COLONIC POLYPS, ADENOMATOUS, HX OF 12/07/2007   EXTERNAL HEMORRHOIDS 09/11/2007   CROHN'S DISEASE, LARGE AND SMALL INTESTINES 09/11/2007   OSTEOPOROSIS 09/11/2007   HYPERLIPIDEMIA NEC/NOS 02/27/2007   Essential hypertension 02/27/2007    Baruch Merl, PT 07/04/21 12:37 PM   [facility] @ [address] [place], [place], [address] Phone: [phone]   Fax:  [phone]  Name: LYONEL MOREJON MRN: [id number] Date of Birth: 09/11/1960

## 2021-07-05 ENCOUNTER — Encounter: Payer: Self-pay | Admitting: Radiation Oncology

## 2021-07-05 ENCOUNTER — Encounter: Payer: Medicare Other | Admitting: Dietician

## 2021-07-05 ENCOUNTER — Ambulatory Visit
Admission: RE | Admit: 2021-07-05 | Discharge: 2021-07-05 | Disposition: A | Discharge status: Home / Self Care | Payer: Medicare Other | Source: Ambulatory Visit | Attending: Radiation Oncology | Admitting: Radiation Oncology

## 2021-07-05 DIAGNOSIS — C44329 Squamous cell carcinoma of skin of other parts of face: Secondary | ICD-10-CM | POA: Diagnosis not present

## 2021-07-05 DIAGNOSIS — R279 Unspecified lack of coordination: Secondary | ICD-10-CM | POA: Diagnosis not present

## 2021-07-05 DIAGNOSIS — R296 Repeated falls: Secondary | ICD-10-CM | POA: Diagnosis not present

## 2021-07-05 DIAGNOSIS — M6281 Muscle weakness (generalized): Secondary | ICD-10-CM | POA: Diagnosis not present

## 2021-07-05 DIAGNOSIS — R2689 Other abnormalities of gait and mobility: Secondary | ICD-10-CM | POA: Diagnosis not present

## 2021-07-05 NOTE — Progress Notes (Signed)
Oncology Nurse Navigator Documentation   Met with [name] [name] and his wife before his final RT to offer support and to celebrate end of radiation treatment.    Explained my role as navigator will continue for several more months, encouraged him to call me with needs/concerns.    Harlow Asa RN, BSN, OCN Head & Neck Oncology Nurse [place] at [facility] Phone # [phone]  Fax # [phone]

## 2021-07-06 ENCOUNTER — Ambulatory Visit: Payer: Medicare Other

## 2021-07-09 ENCOUNTER — Ambulatory Visit: Payer: Medicare Other

## 2021-07-09 ENCOUNTER — Ambulatory Visit: Payer: 59 | Admitting: Family Medicine

## 2021-07-09 NOTE — Progress Notes (Addendum)
[facility] Note  07/10/2021     CHIEF COMPLAINT Patient presents for Retina Follow Up   HISTORY OF PRESENT ILLNESS: [name] [name] is a 61 y.o. male who presents to the clinic today for:   HPI     Retina Follow Up   Patient presents with  Other.  In right eye.  This started 1 week ago.  I, the attending physician,  performed the HPI with the patient and updated documentation appropriately.        Comments   Patient here for 1 weeks retina follow up for retinoschisis OD. Patient states vision most time good. Some days not.. eye throbs 25%of the time. Gets headaches. No sharp pain. Uses latanoprost QHS OU and Restasis BID OU and at's prn.      Last edited by Bernarda Caffey, MD on 07/10/2021 12:58 PM.    Pt states no changes since he was here last, he is using restasis and AT's for dryness as well as latanoprost  Referring physician: [name], MD [address] [place],   [id number]  HISTORICAL INFORMATION:   Selected notes from the MEDICAL RECORD NUMBER Referred by Dr Lucianne Lei for concern of shallow RD OD LEE:  Ocular Hx- PMH-    CURRENT MEDICATIONS: Current Outpatient Medications (Ophthalmic Drugs)  Medication Sig   cycloSPORINE (RESTASIS) 0.05 % ophthalmic emulsion Place 1 drop into both eyes 2 (two) times daily.   latanoprost (XALATAN) 0.005 % ophthalmic solution Place 1 drop into both eyes at bedtime.    No current facility-administered medications for this visit. (Ophthalmic Drugs)   Current Outpatient Medications (Other)  Medication Sig   Calcium Carbonate-Vitamin D (CALCIUM 600+D PO) Take 1 tablet by mouth 2 (two) times daily.   chlorhexidine (PERIDEX) 0.12 % solution Use as directed 15 mLs in the mouth or throat in the morning, at noon, and at bedtime.   cholecalciferol (VITAMIN D) 1000 units tablet Take 1,000 Units at bedtime by mouth.    esomeprazole (NEXIUM) 40 MG capsule Take 1 capsule (40 mg total) by  mouth 2 (two) times daily before a meal.   Eszopiclone 3 MG TABS Take 3 mg by mouth at bedtime. Take immediately before bedtime   feeding supplement (ENSURE ENLIVE / ENSURE PLUS) LIQD Take 237 mLs by mouth 2 (two) times daily between meals.   folic acid (FOLVITE) 1 MG tablet Take 2 tablets (2 mg total) by mouth daily.   glucose blood (CHOICE DM FORA G20 TEST STRIPS) test strip Use as instructed   imiquimod (ALDARA) 5 % cream Apply 1 packet topically 2 (two) times a week.   Lactase (DAIRY RELIEF PO) Take 2 tablets by mouth in the morning and at bedtime.   lamoTRIgine (LAMICTAL) 150 MG tablet Take 1 tablet (150 mg total) by mouth 2 (two) times daily.   mercaptopurine (PURINETHOL) 50 MG tablet TAKE 2 TABLETS DAILY ON AN               EMPTY STOMACH 1 HOUR BEFORE              OR 2 HOURS AFTER A MEAL.                 CAUTION: CHEMOTHERAPY. NEED APPOINTMENT.   mesalamine (PENTASA) 500 MG CR capsule Take 1,000 mg by mouth daily.   methscopolamine (PAMINE FORTE) 5 MG tablet Take 1 tablet (5 mg total) by mouth 2 (two) times daily.   Multiple Vitamin (  MULTIVITAMIN WITH MINERALS) TABS tablet Take 1 tablet by mouth daily.   ONE TOUCH ULTRA TEST test strip USE AS INSTRUCTED   potassium chloride SA (KLOR-CON) 20 MEQ tablet Take 1 tablet (20 mEq total) by mouth 2 (two) times daily.   Probiotic Product (PROBIOTIC PO) Take 1 tablet at bedtime by mouth.    rifaximin (XIFAXAN) 550 MG TABS tablet Take 1 tablet (550 mg total) by mouth 2 (two) times daily.   sertraline (ZOLOFT) 100 MG tablet Take 200 mg daily with breakfast by mouth.    tolterodine (DETROL LA) 4 MG 24 hr capsule Take 4 mg by mouth daily.   traZODone (DESYREL) 150 MG tablet Take 150 mg by mouth at bedtime.   No current facility-administered medications for this visit. (Other)   Facility-Administered Medications Ordered in Other Visits (Other)  Medication Route   gadopentetate dimeglumine (MAGNEVIST) injection 20 mL Intravenous   REVIEW OF  SYSTEMS: ROS   Positive for: Gastrointestinal, Neurological, Skin, Musculoskeletal, Endocrine, Eyes, Heme/Lymph Negative for: Constitutional, Genitourinary, HENT, Cardiovascular, Respiratory, Psychiatric, Allergic/Imm Last edited by Theodore Demark, COA on 07/10/2021  9:33 AM.      ALLERGIES Allergies  Allergen Reactions   Azithromycin Swelling   Beef-Derived Products Diarrhea    RED MEAT > UNSPECIFIED REACTION    Milk-Related Compounds Diarrhea    DAIRY >UNSPECIFIED REACTION    Penicillins Hives    Rash as child- tolerates Ancef/CHILDHOOD ALLERGY Has patient had a PCN reaction causing immediate rash, facial/tongue/throat swelling, SOB or lightheadedness with hypotension: YES Has patient had a PCN reaction causing severe rash involving mucus membranes or skin necrosis: Unknown Has patient had a PCN reaction that required hospitalization: Unknown Has patient had a PCN reaction occurring within the last 10 years:Unknown If all of the above answers are "NO", then may proceed with Cephalosporin [place]   Sulfonamide Derivatives Hives   Dilaudid [Hydromorphone Hcl] Nausea And Vomiting    PAST MEDICAL HISTORY Past Medical History:  Diagnosis Date   Adenomatous colon polyp 10/1999   Anxiety    Arthritis    neck, yoga helps.   Cataract    Complex partial seizure ([place])    last seizure was 08-30-2018   Crohn's disease of small and large intestines ([place]) 1999   Depression    Diabetes mellitus    no meds at this time 10-24-17   Esophageal stricture    External hemorrhoids    GERD (gastroesophageal reflux disease)    Glaucoma    History of kidney stones    3 large stones still present   Hyperlipemia    Hypertension    resolved with weight loss    Inguinal hernia, bilateral 12/2019   Liver cirrhosis secondary to NASH ([place])    Migraines    Osteoporosis    PONV (postoperative nausea and vomiting)    PTSD (post-traumatic stress disorder)    Renal cyst, acquired, left 07/08/2017   Skin  cancer    squamous cell multiple; [place]; followed every 3 months.   Sleep apnea    CPAP machine, uses nightly   Staphylococcus aureus bacteremia with sepsis ([place]) 05/28/2017   MRSA 2012   Past Surgical History:  Procedure Laterality Date   CATARACT EXTRACTION Bilateral    COLONOSCOPY     DEBRIDEMENT MANDIBLE N/A 12/07/2020   Procedure: DEBRIDEMENT OF MANDIBLE;  Surgeon: [name], DMD;  Location: [place];  Service: Oral Surgery;  Laterality: N/A;   ELBOW SURGERY  2012   elbow MRSA infection  EYE SURGERY     cataracts removed, /w IOL   GROSS TEETH DEBRIDEMENT N/A 12/29/2020   Procedure: MANDIBULAR  DEBRIDEMENT;  Surgeon: [name], DMD;  Location: [place];  Service: Dentistry;  Laterality: N/A;   INCISION AND DRAINAGE ABSCESS N/A 12/07/2020   Procedure: INCISION AND DRAINAGE MANDIBULAR ABSCESS;  Surgeon: [name], DMD;  Location: [place];  Service: Oral Surgery;  Laterality: N/A;   INGUINAL HERNIA REPAIR Right 01/14/2020   Procedure: OPEN RIGHT HERNIA REPAIR INGUINAL WITH MESH;  Surgeon: [name], MD;  Location: [facility];  Service: General;  Laterality: Right;   INSERTION OF MESH N/A 07/01/2016   Procedure: INSERTION OF MESH;  Surgeon: [name], MD;  Location: [place];  Service: General;  Laterality: N/A;   IR URETERAL STENT LEFT NEW ACCESS W/O SEP NEPHROSTOMY CATH  02/02/2018   NEPHROLITHOTOMY Left 02/02/2018   Procedure: NEPHROLITHOTOMY PERCUTANEOUS;  Surgeon: [name], MD;  Location: [facility];  Service: Urology;  Laterality: Left;   POLYPECTOMY     SCALP LACERATION REPAIR Right 10/16/2017   From fall/staples   SHOULDER ARTHROSCOPY Right 03/05/2018   Procedure: ARTHROSCOPY SHOULDER AND OPEN DISTAL CLAVICLE EXCISION;  Surgeon: [name], MD;  Location: [place];  Service: Orthopedics;  Laterality: Right;   SHOULDER SURGERY     SINUS SURGERY WITH INSTATRAK     TEE WITHOUT CARDIOVERSION N/A 05/09/2017   Procedure: TRANSESOPHAGEAL ECHOCARDIOGRAM  (TEE);  Surgeon: [name], MD;  Location: [facility];  Service: Cardiovascular;  Laterality: N/A;   TOOTH EXTRACTION N/A 12/07/2020   Procedure: DENTAL EXTRACTIONS OF TEETH TWENTY TO THIRTY;  Surgeon: [name], DMD;  Location: [place];  Service: Oral Surgery;  Laterality: N/A;   TOOTH EXTRACTION N/A 12/29/2020   Procedure: DENTAL RESTORATION/EXTRACTIONS;  Surgeon: [name], DMD;  Location: [place];  Service: Dentistry;  Laterality: N/A;   TRANSTHORACIC ECHOCARDIOGRAM  02/22/2011   EF 55-65%; increased pattern of LVH with mild conc hypertrophy, abnormal relaxation & increased filling pressure (grade 2 diastolic dysfunction); atrial septum thickened (lipomatous hypertrophy)   UMBILICAL HERNIA REPAIR N/A 07/01/2016   Procedure: UMBILICAL HERNIA REPAIR WITH MESH;  Surgeon: [name], MD;  Location: [place];  Service: General;  Laterality: N/A;   UPPER GASTROINTESTINAL ENDOSCOPY     VASECTOMY      FAMILY HISTORY Family History  Problem Relation Age of Onset   Heart disease Mother        CABG at age 66   Hyperlipidemia Mother    Hypertension Mother    Diabetes Father    Colon polyps Father    Stroke Father    Hyperlipidemia Father    Hypertension Father    Stroke Paternal Grandmother    Stroke Paternal Grandfather    Other Child        tetrology of fallot (cornealia deland syndrome)   Colon cancer Neg Hx    Esophageal cancer Neg Hx    Stomach cancer Neg Hx    Rectal cancer Neg Hx     SOCIAL HISTORY Social History   Tobacco Use   Smoking status: Never    Passive exposure: Never   Smokeless tobacco: Never  Vaping Use   Vaping Use: Never used  Substance Use Topics   Alcohol use: Never   Drug use: Never       OPHTHALMIC EXAM:  Base Eye Exam     Visual Acuity (Snellen - Linear)       Right Left  Dist [place] 20/40 20/50 -2   Dist ph  NI 20/40         Tonometry (Tonopen, 9:29 AM)       Right Left   Pressure 10 08         Pupils        Dark Light Shape React APD   Right 3 2 Round Brisk None   Left 3 2 Round Brisk None         Visual Fields       Left Right   Restrictions Partial outer superior temporal, inferior temporal, inferior nasal deficiencies Partial outer superior temporal, inferior temporal, superior nasal, inferior nasal deficiencies         Extraocular Movement       Right Left    Full, Ortho Full, Ortho         Neuro/Psych     Oriented x3: Yes   Mood/Affect: Normal         Dilation     Both eyes: 1.0% Mydriacyl, 2.5% Phenylephrine @ 9:29 AM           Slit Lamp and Fundus Exam     External Exam       Right Left   External SCC right upper cheek          Slit Lamp Exam       Right Left   Lids/Lashes Dermatochalasis - upper lid Dermatochalasis - upper lid   Conjunctiva/Sclera White and quiet White and quiet   Cornea 1-2+ inferior Punctate epithelial erosions, tear film debris Clear   Anterior Chamber Deep and quiet Deep and quiet   Iris Round and dilated Round and dilated   Lens Toric PC IOL in good position with marks at 0600 and 1200 PC IOL in good position, 1+ Posterior capsular opacification   Anterior Vitreous Vitreous syneresis Vitreous syneresis         Fundus Exam       Right Left   Disc Pink and Sharp, thin inferior rim mild Pallor, Sharp rim   C/D Ratio 0.6 0.5   Macula Flat, Good foveal reflex, mild RPE mottling, no heme Flat, Blunted foveal reflex, mild RPE mottling   Vessels mild attenuation, mild Copper wiring, mild tortuousity mild attenuation, mild Copper wiring, mild tortuousity   Periphery Elevation from 1100-1230 --  schisis with focal pocket of SRF posteriorly (schisis RD, no RT on prior scleral depression) - improved -- now just shallow schisis w/o SRF, pigmented pavingstone IT quad, pigmented CR scarring and pavingstone temporally Attached, CR scarring and atrophy / pavingstone ST / IT            IMAGING AND PROCEDURES  Imaging and  Procedures for 07/10/2021  OCT, Retina - OU - Both Eyes       Right Eye Quality was good. Central Foveal Thickness: 299. Progression has been stable. Findings include normal foveal contour, no IRF, subretinal fluid, vitreomacular adhesion (Focal SRF within schisis cavity -- schisis detachment superior periphery--not imaged today).   Left Eye Quality was good. Central Foveal Thickness: 291. Progression has been stable. Findings include normal foveal contour, no IRF, no SRF (Mild cystic changes inferior macula).   Notes *Images captured and stored on drive  Diagnosis / Impression:  OD: Focal SRF within schisis cavity -- schisis detachment superior periphery--not imaged today OS: Mild cystic changes inferior macula  Clinical management:  See below  Abbreviations: NFP - Normal foveal profile. CME - cystoid macular edema. PED - pigment  epithelial detachment. IRF - intraretinal fluid. SRF - subretinal fluid. EZ - ellipsoid zone. ERM - epiretinal membrane. ORA - outer retinal atrophy. ORT - outer retinal tubulation. SRHM - subretinal hyper-reflective material. IRHM - intraretinal hyper-reflective material              ASSESSMENT/PLAN:    ICD-10-CM   1. Right retinoschisis  H33.101 OCT, Retina - OU - Both Eyes    2. Right retinal detachment  H33.21     3. Post-radiation retinopathy, subsequent encounter  T66.XXXD    H35.89     4. Essential hypertension  I10     5. Hypertensive retinopathy of both eyes  H35.033     6. Pseudophakia, both eyes  Z96.1     7. Low-tension glaucoma of both eyes, mild stage  H40.1231      1-3. 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 Hypertensive retinopathy OU - discussed importance of tight BP control - monitor   6. Pseudophakia OD  - s/p CE/IOL  - IOL in good position, doing well  - monitor  7. Low tension glaucoma  - managed by Dr. Lucianne Lei  - IOP: 10,08  - on latanoprost qhs OU   Ophthalmic Meds Ordered this visit:  No orders of the defined types were placed in this encounter.    Return for f/u 2-3 weeks, retinoschisis OD, DFE, OCT.  There are no Patient Instructions on file for this visit.  Explained the diagnoses, plan, and follow up with the patient and they expressed understanding.  Patient expressed understanding of the importance of proper follow up care.   This document serves as a record of services personally performed by Gardiner Sleeper, MD, PhD. It was created on their behalf by Estill Bakes, COT an ophthalmic technician. The creation of this record is the provider's dictation and/or activities during the visit.    Electronically signed by: Estill Bakes, COT 1.16.23 @ 1:02 PM   This document serves as a record of services personally performed by Gardiner Sleeper, MD, PhD. It was created on their behalf by San Jetty. Owens Shark, OA an ophthalmic technician. The creation of this record is the provider's dictation and/or activities during the visit.    Electronically signed by: San Jetty. Owens Shark, [place] 01.17.2023 1:02 PM   Gardiner Sleeper, M.D., Ph.D. Diseases & Surgery of the Retina and Vitreous [facility]  I have reviewed the above documentation for accuracy and completeness, and I agree with the above. Gardiner Sleeper, M.D., Ph.D. 07/10/21 1:03 PM   Abbreviations: M myopia (nearsighted); A astigmatism; H hyperopia (farsighted);  P presbyopia; Mrx spectacle prescription;  CTL contact lenses; OD right eye; OS left eye; OU both eyes  XT exotropia; ET esotropia; PEK punctate epithelial keratitis; PEE punctate epithelial erosions; DES dry eye syndrome; MGD meibomian gland dysfunction; ATs artificial tears; PFAT's preservative free artificial tears; [place] nuclear sclerotic cataract; PSC posterior subcapsular cataract; ERM epi-retinal membrane; PVD posterior  vitreous detachment; RD retinal detachment; DM diabetes mellitus; DR diabetic retinopathy; NPDR non-proliferative diabetic retinopathy; PDR proliferative diabetic retinopathy; CSME clinically significant macular edema; DME diabetic macular edema; dbh dot blot hemorrhages; CWS cotton wool spot; POAG primary open angle glaucoma; C/D cup-to-disc ratio; HVF humphrey visual field; GVF goldmann visual field; OCT optical coherence tomography; IOP intraocular pressure; BRVO Branch retinal vein occlusion; CRVO central retinal vein occlusion; CRAO central retinal artery occlusion; BRAO branch retinal artery occlusion; RT retinal tear; SB scleral buckle; PPV pars plana vitrectomy; VH Vitreous hemorrhage; PRP panretinal laser photocoagulation; IVK intravitreal kenalog; VMT vitreomacular traction; MH Macular hole;  NVD neovascularization of the disc; NVE neovascularization elsewhere; AREDS age related eye disease study; ARMD age related macular degeneration; POAG primary open angle glaucoma; EBMD epithelial/anterior basement membrane dystrophy; ACIOL anterior chamber intraocular lens; IOL intraocular lens; PCIOL posterior chamber intraocular lens; Phaco/IOL phacoemulsification with intraocular lens placement; [place] photorefractive keratectomy; LASIK laser assisted in situ keratomileusis; HTN hypertension; DM diabetes mellitus; COPD chronic obstructive pulmonary disease

## 2021-07-10 ENCOUNTER — Encounter: Payer: Medicare Other | Admitting: Dietician

## 2021-07-10 ENCOUNTER — Encounter (INDEPENDENT_AMBULATORY_CARE_PROVIDER_SITE_OTHER): Payer: Self-pay | Admitting: Ophthalmology

## 2021-07-10 ENCOUNTER — Ambulatory Visit: Payer: Medicare Other

## 2021-07-10 ENCOUNTER — Other Ambulatory Visit: Payer: Self-pay

## 2021-07-10 ENCOUNTER — Ambulatory Visit (INDEPENDENT_AMBULATORY_CARE_PROVIDER_SITE_OTHER): Payer: 59 | Admitting: Ophthalmology

## 2021-07-10 DIAGNOSIS — H33101 Unspecified retinoschisis, right eye: Secondary | ICD-10-CM

## 2021-07-10 DIAGNOSIS — H3321 Serous retinal detachment, right eye: Secondary | ICD-10-CM

## 2021-07-10 DIAGNOSIS — Z961 Presence of intraocular lens: Secondary | ICD-10-CM

## 2021-07-10 DIAGNOSIS — I10 Essential (primary) hypertension: Secondary | ICD-10-CM

## 2021-07-10 DIAGNOSIS — H35033 Hypertensive retinopathy, bilateral: Secondary | ICD-10-CM

## 2021-07-10 DIAGNOSIS — H3589 Other specified retinal disorders: Secondary | ICD-10-CM

## 2021-07-10 DIAGNOSIS — H401231 Low-tension glaucoma, bilateral, mild stage: Secondary | ICD-10-CM

## 2021-07-11 ENCOUNTER — Other Ambulatory Visit: Payer: Medicare Other

## 2021-07-11 ENCOUNTER — Ambulatory Visit: Payer: Medicare Other | Admitting: Physical Therapy

## 2021-07-11 ENCOUNTER — Encounter: Payer: Self-pay | Admitting: Physical Therapy

## 2021-07-11 ENCOUNTER — Ambulatory Visit: Payer: Medicare Other | Admitting: Internal Medicine

## 2021-07-11 DIAGNOSIS — C4442 Squamous cell carcinoma of skin of scalp and neck: Secondary | ICD-10-CM | POA: Diagnosis not present

## 2021-07-11 DIAGNOSIS — M6281 Muscle weakness (generalized): Secondary | ICD-10-CM | POA: Diagnosis not present

## 2021-07-11 DIAGNOSIS — R296 Repeated falls: Secondary | ICD-10-CM | POA: Diagnosis not present

## 2021-07-11 DIAGNOSIS — Z85828 Personal history of other malignant neoplasm of skin: Secondary | ICD-10-CM | POA: Diagnosis not present

## 2021-07-11 DIAGNOSIS — C44222 Squamous cell carcinoma of skin of right ear and external auricular canal: Secondary | ICD-10-CM | POA: Diagnosis not present

## 2021-07-11 DIAGNOSIS — R2689 Other abnormalities of gait and mobility: Secondary | ICD-10-CM | POA: Diagnosis not present

## 2021-07-11 DIAGNOSIS — R279 Unspecified lack of coordination: Secondary | ICD-10-CM

## 2021-07-11 DIAGNOSIS — C44329 Squamous cell carcinoma of skin of other parts of face: Secondary | ICD-10-CM | POA: Diagnosis not present

## 2021-07-11 DIAGNOSIS — D485 Neoplasm of uncertain behavior of skin: Secondary | ICD-10-CM | POA: Diagnosis not present

## 2021-07-11 DIAGNOSIS — C44622 Squamous cell carcinoma of skin of right upper limb, including shoulder: Secondary | ICD-10-CM | POA: Diagnosis not present

## 2021-07-11 NOTE — Therapy (Signed)
[facility] @ [place], [place], [address] Phone: [phone]   Fax:  [phone]  Physical Therapy Treatment  Patient Details  Name: [name] [name] MRN: [id number] Date of Birth: Nov 05, 1960 Referring Provider (PT): [name], MD   Encounter Date: 07/11/2021   PT End of Session - 07/11/21 1335     Visit Number 27    Date for PT Re-Evaluation 09/05/21    Authorization Type [employer]    Authorization - Visit Number 63    Authorization - Number of Visits 60    Progress Note Due on Visit 30    PT Start Time 1100    PT Stop Time 1139    PT Time Calculation (min) 39 min    Activity Tolerance Patient tolerated treatment well    Behavior During Therapy [facility] for tasks assessed/performed             Past Medical History:  Diagnosis Date   Adenomatous colon polyp 10/1999   Anxiety    Arthritis    neck, yoga helps.   Cataract    Complex partial seizure ([place])    last seizure was 08-30-2018   Crohn's disease of small and large intestines ([place]) 1999   Depression    Diabetes mellitus    no meds at this time 10-24-17   Esophageal stricture    External hemorrhoids    GERD (gastroesophageal reflux disease)    Glaucoma    History of kidney stones    3 large stones still present   Hyperlipemia    Hypertension    resolved with weight loss    Inguinal hernia, bilateral 12/2019   Liver cirrhosis secondary to NASH ([place])    Migraines    Osteoporosis    PONV (postoperative nausea and vomiting)    PTSD (post-traumatic stress disorder)    Renal cyst, acquired, left 07/08/2017   Skin cancer    squamous cell multiple; [facility]; followed every 3 months.   Sleep apnea    CPAP machine, uses nightly   Staphylococcus aureus bacteremia with sepsis ([place]) 05/28/2017   MRSA 2012    Past Surgical History:  Procedure Laterality Date   CATARACT EXTRACTION Bilateral    COLONOSCOPY     DEBRIDEMENT MANDIBLE N/A 12/07/2020    Procedure: DEBRIDEMENT OF MANDIBLE;  Surgeon: [name], DMD;  Location: [facility];  Service: Oral Surgery;  Laterality: N/A;   ELBOW SURGERY  2012   elbow MRSA infection    EYE SURGERY     cataracts removed, /w IOL   GROSS TEETH DEBRIDEMENT N/A 12/29/2020   Procedure: MANDIBULAR  DEBRIDEMENT;  Surgeon: [name], DMD;  Location: [facility];  Service: Dentistry;  Laterality: N/A;   INCISION AND DRAINAGE ABSCESS N/A 12/07/2020   Procedure: INCISION AND DRAINAGE MANDIBULAR ABSCESS;  Surgeon: [name], DMD;  Location: [facility];  Service: Oral Surgery;  Laterality: N/A;   INGUINAL HERNIA REPAIR Right 01/14/2020   Procedure: OPEN RIGHT HERNIA REPAIR INGUINAL WITH MESH;  Surgeon: [name], MD;  Location: [facility];  Service: General;  Laterality: Right;   INSERTION OF MESH N/A 07/01/2016   Procedure: INSERTION OF MESH;  Surgeon: [name], MD;  Location: [facility];  Service: General;  Laterality: N/A;   IR URETERAL STENT LEFT NEW ACCESS W/O SEP NEPHROSTOMY CATH  02/02/2018   NEPHROLITHOTOMY Left 02/02/2018   Procedure: NEPHROLITHOTOMY PERCUTANEOUS;  Surgeon: [name], MD;  Location: [facility];  Service:  Urology;  Laterality: Left;   POLYPECTOMY     SCALP LACERATION REPAIR Right 10/16/2017   From fall/staples   SHOULDER ARTHROSCOPY Right 03/05/2018   Procedure: ARTHROSCOPY SHOULDER AND OPEN DISTAL CLAVICLE EXCISION;  Surgeon: [name], MD;  Location: [facility];  Service: Orthopedics;  Laterality: Right;   SHOULDER SURGERY     SINUS SURGERY WITH INSTATRAK     TEE WITHOUT CARDIOVERSION N/A 05/09/2017   Procedure: TRANSESOPHAGEAL ECHOCARDIOGRAM (TEE);  Surgeon: [name], MD;  Location: [facility];  Service: Cardiovascular;  Laterality: N/A;   TOOTH EXTRACTION N/A 12/07/2020   Procedure: DENTAL EXTRACTIONS OF TEETH TWENTY TO THIRTY;  Surgeon: [name], DMD;  Location: [facility];  Service: Oral Surgery;  Laterality: N/A;   TOOTH EXTRACTION N/A 12/29/2020   Procedure:  DENTAL RESTORATION/EXTRACTIONS;  Surgeon: [name], DMD;  Location: [facility];  Service: Dentistry;  Laterality: N/A;   TRANSTHORACIC ECHOCARDIOGRAM  02/22/2011   EF 55-65%; increased pattern of LVH with mild conc hypertrophy, abnormal relaxation & increased filling pressure (grade 2 diastolic dysfunction); atrial septum thickened (lipomatous hypertrophy)   UMBILICAL HERNIA REPAIR N/A 07/01/2016   Procedure: UMBILICAL HERNIA REPAIR WITH MESH;  Surgeon: [name], MD;  Location: [facility];  Service: General;  Laterality: N/A;   UPPER GASTROINTESTINAL ENDOSCOPY     VASECTOMY      There were no vitals filed for this visit.   Subjective Assessment - 07/11/21 1105     Subjective Pt reports 80% improved confidence and feeling of improved strength since starting PT.  Pt continues to walk for exercise using his walking stick most days of the week and has had 2 falls in last month due to onset of dizziness.  He continues to receive radiation for skin cancer and has another oncologist meeting to address another area that needs attention.    Patient is accompained by: Family member   Pt's wife   Pertinent History on disability, has memory problems, liver cirrhosis, Chrons, osteoporosis with multiple fractures including C6, thrombocytopenia, gluacoma (loss of peripheral vision).  Has lost 75lb through walking but concerned about safety    Limitations Walking    How long can you walk comfortably? 5 miles with walking stick    Diagnostic tests C6 fracture and ligamentum flavum tear - non-surgical    Patient Stated Goals work on coming down and up stairs, balance    Currently in Pain? No/denies                [facility] PT Assessment - 07/11/21 0001       Assessment   Medical Diagnosis R29.6 (ICD-10-CM) - Repeated falls    Referring Provider (PT) [name], MD      Strength   Overall Strength Comments LEs WNL bil      Ambulation/Gait   Gait Pattern Step-through pattern;Decreased arm  swing - right;Decreased arm swing - left;Decreased trunk rotation    Curb 6: Modified independent (Device/increase time)   uses lateral step down and step up successfully     Standardized Balance Assessment   Standardized Balance Assessment Dynamic Gait Index      Dynamic Gait Index   Level Surface Normal    Change in Gait Speed Normal    Gait with Horizontal Head Turns Moderate Impairment   with Lt head turn   Gait with Vertical Head Turns Mild Impairment   stops walking to change head position   Gait and Pivot Turn Normal    Step Over Obstacle  Mild Impairment    Step Around Obstacles Normal    Steps Normal    Total Score 20    DGI comment: 20/24      Timed Up and Go Test   TUG Normal TUG    Normal TUG (seconds) 10                           [facility] Adult PT Treatment/Exercise - 07/11/21 0001       Exercises   Exercises Shoulder;Knee/Hip;Lumbar      Lumbar Exercises: Aerobic   Nustep L6 x 6' seat 10 arms 11 PT present to discuss status and monitor      Lumbar Exercises: Seated   Sit to Stand 10 reps    Sit to Stand Limitations hold 10lb                 Balance Exercises - 07/11/21 0001       Balance Exercises: Standing   Gait with Head Turns 4 reps;Forward;Cognitive challenge   PT called up/down/Lt/Rt for head positioning x 4 laps length of long hall in clinic, VC to focus on grounding feet in middle path vs drift with head turn   Step Over Hurdles / [place]es hurdle step over 3x4 hurdles each: lead with Rt step to, lead with Lt step to, step over pattern with pause for control in stagger stance    Cone Rotation Left turn;Right turn;Solid surface;A/P    Marching Solid surface;10 reps   3 sec hold, intermittent min/mod A for balance                 PT Short Term Goals - 04/05/21 0926       PT SHORT TERM GOAL #1   Title Patient will complete 3 minute walk test using LRAD with only supervision to indicate decreased fall risk.    Status  Achieved      PT SHORT TERM GOAL #2   Title Pt will verbalize and demo safe strategies for stair pattern (step to for descending) and gait patterns/pace (slow down) without AD    Status Achieved      PT SHORT TERM GOAL #3   Title PT and Pt will explore gait with walker and make decisions about appropriateness of use for improved safety.    Baseline using walking sticks, has had 1 fall since starting PT    Status Achieved      PT SHORT TERM GOAL #4   Title Pt ind with initial HEP    Status Achieved               PT Long Term Goals - 07/11/21 1136       PT LONG TERM GOAL #1   Title Improve 5x sit to stand to </= 14 sec.    Status Achieved      PT LONG TERM GOAL #2   Title Pt will demo up/down stairs x 3 rounds (4 6 inch steps) using bil UEs and safe strategy to demo improved safety on stairs    Status Achieved      PT LONG TERM GOAL #3   Title Pt will demo safety/no LOB with community ambulation for 6' walk test, curb up/down and grassy surface with supervision using least restrictive AD or no AD.    Baseline met for curb, weather prevents grassy surface practice    Status On-going      PT LONG TERM GOAL #4  Title Pt will report at least 50% more confidence in balance with daily tasks    Baseline 80%    Status Achieved      PT LONG TERM GOAL #5   Title Ind with advanced HEP and understand how to safely progress    Status On-going      Additional Long Term Goals   Additional Long Term Goals Yes      PT LONG TERM GOAL #6   Title Improved DGI score to at least 22/24 to reduce fall risk with dynamic gait challenges    Baseline 20/24    Status Revised    Target Date 09/05/21      PT LONG TERM GOAL #7   Title Pt able to successfully perform step through pattern over 4 hurdles without LOB at least 2 trials of 3.    Status New    Target Date 09/05/21                   Plan - 07/11/21 1336     Clinical Impression Statement Pt continues to show ongoing  progress toward dynamic gait, balance and coordination challenges.  He has met several LTGs and is making steady progress toward remaining goals.  PT set a new goal today given Pt's progress to further challenge his dynamic gait/balance.  His DGI improved since last reassessment by 4 points, reaching 20/24.  Pt continues to use walking stick on exercise walks outdoors and has had 2 falls within last month, reduced from his fall frequency in the past.  Pt no longer needs gait belt with most gait and balance challenges within sessions but PT provides varied levels of close supervision and min/mod A depending on the challenge for safety.  Pt can now demo excellent weight shifts with cross body UE coordination such as chop, lift and reach without LOB.  He has trouble coordinating UE/LE combined functional movements and switching between Rt and Lt sided exercises.  His biggest challenges are gait with head turns (deviates to ipsilateral side approx 50% of the time), hurdle step overs, and anything challenging him to remain in SLS for any period of time (standing march cone taps).  Pt grows dizzy at times and he states this has been the cause of his 2 recent falls while on outdoor walks.  Overall he feels 80% improvement in balance confidence and strength.  His wife notes he is much safer at home and in community.  Given complex medical history and Hx of multiple falls with fractures, PT recommends continued PT tapering to 1x/week x 8 more weeks as Pt will likely regress without PT and be at high risk for falls again without PT.    Personal Factors and Comorbidities Comorbidity 1;Comorbidity 2;Comorbidity 3+;Behavior Pattern;Time since onset of injury/illness/exacerbation    Comorbidities skin cancer/ongoing radiation and possible upcoming chemo treatment, seizures, memory deficits, osteoporosis with Hx of multiple fractures    Examination-Activity Limitations Locomotion Level;Transfers;Stand;Bend     Examination-Participation Restrictions Community Activity;Driving    Stability/Clinical Decision Making Evolving/Moderate complexity    Clinical Decision Making Moderate    Rehab Potential Good    PT Frequency 1x / week    PT Duration 8 weeks    PT Treatment/Interventions ADLs/Self Care Home Management;Gait training;Stair training;Functional mobility training;Therapeutic activities;Therapeutic exercise;Balance training;Neuromuscular re-education;Manual techniques;Patient/family education;Passive range of motion    PT Next Visit Plan continue stability on single leg for hurdle step through stepovers, march cone taps, cognitive and coordination challenges, gait with head turns  PT Home Exercise Plan [id number]    Consulted and Agree with Plan of Care Patient    Family Member Consulted Pt's wife             Patient will benefit from skilled therapeutic intervention in order to improve the following deficits and impairments:  Decreased coordination, Decreased mobility, Postural dysfunction, Pain, Decreased safety awareness, Impaired perceived functional ability, Decreased balance, Decreased cognition, Impaired flexibility, Dizziness  Visit Diagnosis: Other abnormalities of gait and mobility - Plan: PT plan of care cert/re-cert  Muscle weakness (generalized) - Plan: PT plan of care cert/re-cert  Repeated falls - Plan: PT plan of care cert/re-cert  Unspecified lack of coordination - Plan: PT plan of care cert/re-cert     Problem List Patient Active Problem List   Diagnosis Date Noted   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 05/11/2021   Liver cirrhosis secondary to NASH ([facility])    GERD (gastroesophageal reflux disease)    Pancytopenia ([facility])    Osteomyelitis of jaw 12/29/2020   Acute osteomyelitis of mandible 12/26/2020   S/P inguinal hernia repair 01/14/2020   At high risk for falls    S/P shoulder surgery 03/05/2018   Excessive daytime sleepiness 02/18/2018   Crohn's  disease of colon with rectal bleeding ([place]) 02/18/2018   Blood in stool 02/18/2018   Iron deficiency anemia due to chronic blood loss 02/18/2018   Iron deficiency anemia due to sideropenic dysphagia 02/18/2018   Renal calculus, left 02/02/2018   History of sepsis 12/16/2017   Renal cyst, acquired, left 07/08/2017   Frequent falls 07/08/2017   Renal lesion 05/28/2017   MSSA bacteremia 05/06/2017   Confusion 04/02/2017   Syncope 08/30/2016   Depression 04/30/2016   Memory loss 04/30/2016   OSA on CPAP 10/31/2015   Nonepileptic episode ([place]) 04/25/2015   Seizure ([place]) 04/04/2013   Abnormal EKG 03/12/2013   DOE (dyspnea on exertion) 03/12/2013   Complex partial seizure ([place]) 04/19/2012   DM2 (diabetes mellitus, type 2) ([place]) 09/25/2011   OTHER DYSPHAGIA 03/27/2009   Cough 11/10/2008   GERD 12/07/2007   COLONIC POLYPS, ADENOMATOUS, HX OF 12/07/2007   EXTERNAL HEMORRHOIDS 09/11/2007   CROHN'S DISEASE, LARGE AND SMALL INTESTINES 09/11/2007   OSTEOPOROSIS 09/11/2007   HYPERLIPIDEMIA NEC/NOS 02/27/2007   Essential hypertension 02/27/2007    Baruch Merl, PT 07/11/21 1:50 PM   [facility] @ [address] [place], [place], [address] Phone: [phone]   Fax:  [phone]  Name: [name] [name] MRN: [id number] Date of Birth: July 05, 1960

## 2021-07-12 ENCOUNTER — Other Ambulatory Visit: Payer: Self-pay

## 2021-07-12 ENCOUNTER — Inpatient Hospital Stay (HOSPITAL_BASED_OUTPATIENT_CLINIC_OR_DEPARTMENT_OTHER): Payer: Medicare Other | Admitting: Internal Medicine

## 2021-07-12 ENCOUNTER — Other Ambulatory Visit: Payer: Self-pay | Admitting: Internal Medicine

## 2021-07-12 ENCOUNTER — Inpatient Hospital Stay: Payer: Medicare Other

## 2021-07-12 ENCOUNTER — Other Ambulatory Visit: Payer: Self-pay | Admitting: *Deleted

## 2021-07-12 ENCOUNTER — Ambulatory Visit: Payer: 59 | Admitting: Family Medicine

## 2021-07-12 VITALS — BP 133/78 | HR 76 | Temp 99.2°F | Resp 19 | Ht 69.0 in | Wt 172.1 lb

## 2021-07-12 DIAGNOSIS — C44329 Squamous cell carcinoma of skin of other parts of face: Secondary | ICD-10-CM

## 2021-07-12 DIAGNOSIS — R5382 Chronic fatigue, unspecified: Secondary | ICD-10-CM

## 2021-07-12 DIAGNOSIS — K5090 Crohn's disease, unspecified, without complications: Secondary | ICD-10-CM

## 2021-07-12 DIAGNOSIS — C4492 Squamous cell carcinoma of skin, unspecified: Secondary | ICD-10-CM

## 2021-07-12 DIAGNOSIS — K7469 Other cirrhosis of liver: Secondary | ICD-10-CM | POA: Diagnosis not present

## 2021-07-12 DIAGNOSIS — Z79899 Other long term (current) drug therapy: Secondary | ICD-10-CM | POA: Diagnosis not present

## 2021-07-12 DIAGNOSIS — Z923 Personal history of irradiation: Secondary | ICD-10-CM | POA: Diagnosis not present

## 2021-07-12 LAB — CMP (CANCER CENTER ONLY)
ALT: 25 U/L (ref 0–44)
AST: 30 U/L (ref 15–41)
Albumin: 4.3 g/dL (ref 3.5–5.0)
Alkaline Phosphatase: 114 U/L (ref 38–126)
Anion gap: 6 (ref 5–15)
BUN: 16 mg/dL (ref 6–20)
CO2: 28 mmol/L (ref 22–32)
Calcium: 9.8 mg/dL (ref 8.9–10.3)
Chloride: 108 mmol/L (ref 98–111)
Creatinine: 1.05 mg/dL (ref 0.61–1.24)
GFR, Estimated: >60 mL/min (ref >60)
Glucose, Bld: 101 mg/dL — ABNORMAL HIGH (ref 70–99)
Potassium: 4.3 mmol/L (ref 3.5–5.1)
Sodium: 142 mmol/L (ref 135–145)
Total Bilirubin: 2 mg/dL — ABNORMAL HIGH (ref 0.3–1.2)
Total Protein: 6.8 g/dL (ref 6.5–8.1)

## 2021-07-12 LAB — CBC WITH DIFFERENTIAL (CANCER CENTER ONLY)
Abs Immature Granulocytes: 0.02 10*3/uL (ref 0.00–0.07)
Basophils Absolute: 0 10*3/uL (ref 0.0–0.1)
Basophils Relative: 1 %
Eosinophils Absolute: 0.2 10*3/uL (ref 0.0–0.5)
Eosinophils Relative: 6 %
HCT: 28.1 % — ABNORMAL LOW (ref 39.0–52.0)
Hemoglobin: 9.7 g/dL — ABNORMAL LOW (ref 13.0–17.0)
Immature Granulocytes: 1 %
Lymphocytes Relative: 10 %
Lymphs Abs: 0.4 10*3/uL — ABNORMAL LOW (ref 0.7–4.0)
MCH: 37 pg — ABNORMAL HIGH (ref 26.0–34.0)
MCHC: 34.5 g/dL (ref 30.0–36.0)
MCV: 107.3 fL — ABNORMAL HIGH (ref 80.0–100.0)
Monocytes Absolute: 0.4 10*3/uL (ref 0.1–1.0)
Monocytes Relative: 9 %
Neutro Abs: 2.9 10*3/uL (ref 1.7–7.7)
Neutrophils Relative %: 73 %
Platelet Count: 118 10*3/uL — ABNORMAL LOW (ref 150–400)
RBC: 2.62 MIL/uL — ABNORMAL LOW (ref 4.22–5.81)
RDW: 16.6 % — ABNORMAL HIGH (ref 11.5–15.5)
WBC Count: 3.9 10*3/uL — ABNORMAL LOW (ref 4.0–10.5)
nRBC: 0 % (ref 0.0–0.2)

## 2021-07-12 NOTE — Progress Notes (Signed)
[place] Telephone:([phone]) [phone]   [phone]) [phone]  CONSULT NOTE  REFERRING PHYSICIAN: Dr. Harriett Sine  REASON FOR CONSULTATION:  61 years old white male with recurrent squamous cell carcinoma of the skin.  HPI [name] [name] is a 61 y.o. male with past medical history significant for hypertension, diabetes mellitus, dyslipidemia, [place], PTSD, sleep apnea, GERD, Crohn's disease on multiple suppressant medication, depression, osteoarthritis and anxiety.  The patient mentioned that he has a history of squamous cell carcinoma of the skin mainly on the face, arms, head, legs and ears that has been going on for long time since he was 61 years old.  The patient underwent many Mohs procedure for resection of squamous cell carcinoma.  Most recently he had disease in the right cheek with invasion into the underlying tissue.  The patient was seen by Dr. Isidore Moos and he underwent curative radiotherapy for more than 6 weeks completed last week.  He also noticed a new nodule close to the right side of the nose that is growing even during his radiotherapy. He was referred to me today for evaluation and consideration of treatment with immunotherapy with Libtayo (Cempilimab). When seen today the patient is feeling fine except for few pounds of weight loss and intermittent headache especially behind the right eye.  He is followed by ophthalmology.  He has occasional right nostril bleeding and congestion.  He has no chest pain, shortness of breath, cough or hemoptysis.  He denied having any fever or chills.  He has no nausea, vomiting, but has frequent diarrhea and no constipation.  He has no headache or visual changes. Family history significant for mother with heart disease.  Father had hypertension and died from aneurysm complication.  He also had autoimmune disorders. The patient is married and has 2 children a son and daughter.  He used to work for: [employer].  He was accompanied today by his  wife [name].  He has no history of smoking, alcohol or drug abuse.  HPI  Past Medical History:  Diagnosis Date   Adenomatous colon polyp 10/1999   Anxiety    Arthritis    neck, yoga helps.   Cataract    Complex partial seizure ([place])    last seizure was 08-30-2018   Crohn's disease of small and large intestines ([place]) 1999   Depression    Diabetes mellitus    no meds at this time 10-24-17   Esophageal stricture    External hemorrhoids    GERD (gastroesophageal reflux disease)    Glaucoma    History of kidney stones    3 large stones still present   Hyperlipemia    Hypertension    resolved with weight loss    Inguinal hernia, bilateral 12/2019   Liver cirrhosis secondary to NASH ([place])    Migraines    Osteoporosis    PONV (postoperative nausea and vomiting)    PTSD (post-traumatic stress disorder)    Renal cyst, acquired, left 07/08/2017   Skin cancer    squamous cell multiple; [facility]; followed every 3 months.   Sleep apnea    CPAP machine, uses nightly   Staphylococcus aureus bacteremia with sepsis ([place]) 05/28/2017   MRSA 2012    Past Surgical History:  Procedure Laterality Date   CATARACT EXTRACTION Bilateral    COLONOSCOPY     DEBRIDEMENT MANDIBLE N/A 12/07/2020   Procedure: DEBRIDEMENT OF MANDIBLE;  Surgeon: [name], DMD;  Location: [facility];  Service: Oral Surgery;  Laterality:  N/A;   ELBOW SURGERY  2012   elbow MRSA infection    EYE SURGERY     cataracts removed, /w IOL   GROSS TEETH DEBRIDEMENT N/A 12/29/2020   Procedure: MANDIBULAR  DEBRIDEMENT;  Surgeon: [name], DMD;  Location: [facility];  Service: Dentistry;  Laterality: N/A;   INCISION AND DRAINAGE ABSCESS N/A 12/07/2020   Procedure: INCISION AND DRAINAGE MANDIBULAR ABSCESS;  Surgeon: [name], DMD;  Location: [facility];  Service: Oral Surgery;  Laterality: N/A;   INGUINAL HERNIA REPAIR Right 01/14/2020   Procedure: OPEN RIGHT HERNIA REPAIR INGUINAL WITH MESH;  Surgeon: [name],  MD;  Location: [facility];  Service: General;  Laterality: Right;   INSERTION OF MESH N/A 07/01/2016   Procedure: INSERTION OF MESH;  Surgeon: [name], MD;  Location: [facility];  Service: General;  Laterality: N/A;   IR URETERAL STENT LEFT NEW ACCESS W/O SEP NEPHROSTOMY CATH  02/02/2018   NEPHROLITHOTOMY Left 02/02/2018   Procedure: NEPHROLITHOTOMY PERCUTANEOUS;  Surgeon: [name], MD;  Location: [facility];  Service: Urology;  Laterality: Left;   POLYPECTOMY     SCALP LACERATION REPAIR Right 10/16/2017   From fall/staples   SHOULDER ARTHROSCOPY Right 03/05/2018   Procedure: ARTHROSCOPY SHOULDER AND OPEN DISTAL CLAVICLE EXCISION;  Surgeon: [name], MD;  Location: [facility];  Service: Orthopedics;  Laterality: Right;   SHOULDER SURGERY     SINUS SURGERY WITH INSTATRAK     TEE WITHOUT CARDIOVERSION N/A 05/09/2017   Procedure: TRANSESOPHAGEAL ECHOCARDIOGRAM (TEE);  Surgeon: [name], MD;  Location: [facility];  Service: Cardiovascular;  Laterality: N/A;   TOOTH EXTRACTION N/A 12/07/2020   Procedure: DENTAL EXTRACTIONS OF TEETH TWENTY TO THIRTY;  Surgeon: [name], DMD;  Location: [facility];  Service: Oral Surgery;  Laterality: N/A;   TOOTH EXTRACTION N/A 12/29/2020   Procedure: DENTAL RESTORATION/EXTRACTIONS;  Surgeon: [name], DMD;  Location: [facility];  Service: Dentistry;  Laterality: N/A;   TRANSTHORACIC ECHOCARDIOGRAM  02/22/2011   EF 55-65%; increased pattern of LVH with mild conc hypertrophy, abnormal relaxation & increased filling pressure (grade 2 diastolic dysfunction); atrial septum thickened (lipomatous hypertrophy)   UMBILICAL HERNIA REPAIR N/A 07/01/2016   Procedure: UMBILICAL HERNIA REPAIR WITH MESH;  Surgeon: [name], MD;  Location: [place] [facility];  Service: General;  Laterality: N/A;   UPPER GASTROINTESTINAL ENDOSCOPY     VASECTOMY      Family History  Problem Relation Age of Onset   Heart disease Mother        CABG at age 41   Hyperlipidemia Mother     Hypertension Mother    Diabetes Father    Colon polyps Father    Stroke Father    Hyperlipidemia Father    Hypertension Father    Stroke Paternal Grandmother    Stroke Paternal Grandfather    Other Child        tetrology of fallot (cornealia deland syndrome)   Colon cancer Neg Hx    Esophageal cancer Neg Hx    Stomach cancer Neg Hx    Rectal cancer Neg Hx     Social History Social History   Tobacco Use   Smoking status: Never    Passive exposure: Never   Smokeless tobacco: Never  Vaping Use   Vaping Use: Never used  Substance Use Topics   Alcohol use: Never   Drug use: Never    Allergies  Allergen Reactions   Azithromycin Swelling   Beef-Derived Products Diarrhea  RED MEAT > UNSPECIFIED REACTION    Milk-Related Compounds Diarrhea    DAIRY >UNSPECIFIED REACTION    Penicillins Hives    Rash as child- tolerates Ancef/CHILDHOOD ALLERGY Has patient had a PCN reaction causing immediate rash, facial/tongue/throat swelling, SOB or lightheadedness with hypotension: YES Has patient had a PCN reaction causing severe rash involving mucus membranes or skin necrosis: Unknown Has patient had a PCN reaction that required hospitalization: Unknown Has patient had a PCN reaction occurring within the last 10 years:Unknown If all of the above answers are "NO", then may proceed with Cephalosporin [place]   Sulfonamide Derivatives Hives   Dilaudid [Hydromorphone Hcl] Nausea And Vomiting    Current Outpatient Medications  Medication Sig Dispense Refill   Calcium Carbonate-Vitamin D (CALCIUM 600+D PO) Take 1 tablet by mouth 2 (two) times daily.     chlorhexidine (PERIDEX) 0.12 % solution Use as directed 15 mLs in the mouth or throat in the morning, at noon, and at bedtime. 120 mL 1   cholecalciferol (VITAMIN D) 1000 units tablet Take 1,000 Units at bedtime by mouth.      cycloSPORINE (RESTASIS) 0.05 % ophthalmic emulsion Place 1 drop into both eyes 2 (two) times daily.     esomeprazole  (NEXIUM) 40 MG capsule Take 1 capsule (40 mg total) by mouth 2 (two) times daily before a meal. 180 capsule 3   Eszopiclone 3 MG TABS Take 3 mg by mouth at bedtime. Take immediately before bedtime     feeding supplement (ENSURE ENLIVE / ENSURE PLUS) LIQD Take 237 mLs by mouth 2 (two) times daily between meals. 454 mL 12   folic acid (FOLVITE) 1 MG tablet Take 2 tablets (2 mg total) by mouth daily. 180 tablet 3   glucose blood (CHOICE DM FORA G20 TEST STRIPS) test strip Use as instructed 100 each 12   Lactase (DAIRY RELIEF PO) Take 2 tablets by mouth in the morning and at bedtime.     lamoTRIgine (LAMICTAL) 150 MG tablet Take 1 tablet (150 mg total) by mouth 2 (two) times daily. 180 tablet 3   latanoprost (XALATAN) 0.005 % ophthalmic solution Place 1 drop into both eyes at bedtime.      mercaptopurine (PURINETHOL) 50 MG tablet TAKE 2 TABLETS DAILY ON AN               EMPTY STOMACH 1 HOUR BEFORE              OR 2 HOURS AFTER A MEAL.                 CAUTION: CHEMOTHERAPY. NEED APPOINTMENT. 180 tablet 3   mesalamine (PENTASA) 500 MG CR capsule Take 1,000 mg by mouth daily.     methscopolamine (PAMINE FORTE) 5 MG tablet Take 1 tablet (5 mg total) by mouth 2 (two) times daily. 180 tablet 3   Multiple Vitamin (MULTIVITAMIN WITH MINERALS) TABS tablet Take 1 tablet by mouth daily. 30 tablet 0   ONE TOUCH ULTRA TEST test strip USE AS INSTRUCTED 100 each 2   potassium chloride SA (KLOR-CON) 20 MEQ tablet Take 1 tablet (20 mEq total) by mouth 2 (two) times daily. 180 tablet 3   Probiotic Product (PROBIOTIC PO) Take 1 tablet at bedtime by mouth.      rifaximin (XIFAXAN) 550 MG TABS tablet Take 1 tablet (550 mg total) by mouth 2 (two) times daily. 180 tablet 3   sertraline (ZOLOFT) 100 MG tablet Take 200 mg daily with breakfast by mouth.  tolterodine (DETROL LA) 4 MG 24 hr capsule Take 4 mg by mouth daily.     traZODone (DESYREL) 150 MG tablet Take 150 mg by mouth at bedtime.     imiquimod (ALDARA) 5 %  cream Apply 1 packet topically 2 (two) times a week. (Patient not taking: Reported on 07/12/2021)     No current facility-administered medications for this visit.   Facility-Administered Medications Ordered in Other Visits  Medication Dose Route Frequency Provider Last Rate Last Admin   gadopentetate dimeglumine (MAGNEVIST) injection 20 mL  20 mL Intravenous Once PRN Marcial Pacas, MD        Review of Systems  Constitutional: positive for fatigue and weight loss Eyes: positive for visual disturbance Ears, nose, mouth, throat, and face: positive for epistaxis Respiratory: negative Cardiovascular: negative Gastrointestinal: positive for diarrhea Genitourinary:negative Integument/breast: positive for rash and skin lesion(s) Hematologic/lymphatic: negative Musculoskeletal:negative Neurological: negative Behavioral/Psych: negative Endocrine: negative Allergic/Immunologic: negative  Physical Exam  UVO:ZDGUY, healthy, no distress, well nourished, well developed, and anxious SKIN: skin color, texture, turgor are normal, no rashes or significant lesions HEAD: Normocephalic, No masses, lesions, tenderness or abnormalities EYES: normal, PERRLA, Conjunctiva are pink and non-injected EARS: External ears normal, Canals clear OROPHARYNX:no exudate, no erythema, and lips, buccal mucosa, and tongue normal  NECK: supple, no adenopathy, no JVD LYMPH:  no palpable lymphadenopathy, no hepatosplenomegaly LUNGS: clear to auscultation , and palpation HEART: regular rate & rhythm, no murmurs, and no gallops ABDOMEN:abdomen soft, non-tender, normal bowel sounds, and no masses or organomegaly BACK: Back symmetric, no curvature., No CVA tenderness EXTREMITIES:no joint deformities, effusion, or inflammation, no edema  NEURO: alert & oriented x 3 with fluent speech, no focal motor/sensory deficits  PERFORMANCE STATUS: ECOG 1  LABORATORY DATA: Lab Results  Component Value Date   WBC 3.9 (L) 07/12/2021    HGB 9.7 (L) 07/12/2021   HCT 28.1 (L) 07/12/2021   MCV 107.3 (H) 07/12/2021   PLT 118 (L) 07/12/2021      Chemistry      Component Value Date/Time   NA 142 06/04/2021 0959   NA 142 01/11/2020 0924   K 4.1 06/04/2021 0959   CL 107 06/04/2021 0959   CO2 25 06/04/2021 0959   BUN 23 06/04/2021 0959   BUN 10 01/11/2020 0924   CREATININE 1.02 06/04/2021 0959   CREATININE 0.78 12/14/2020 1407   CREATININE 0.84 05/15/2016 1005      Component Value Date/Time   CALCIUM 9.7 06/04/2021 0959   ALKPHOS 99 06/04/2021 0959   AST 28 06/04/2021 0959   AST 52 (H) 12/14/2020 1407   ALT 23 06/04/2021 0959   ALT 57 (H) 12/14/2020 1407   BILITOT 2.0 (H) 06/04/2021 0959   BILITOT 1.2 12/14/2020 1407       RADIOGRAPHIC STUDIES: OCT, Retina - OU - Both Eyes  Result Date: 07/10/2021 Right Eye Quality was good. Central Foveal Thickness: 299. Progression has been stable. Findings include normal foveal contour, no IRF, subretinal fluid, vitreomacular adhesion (Focal SRF within schisis cavity -- schisis detachment superior periphery--not imaged today). Left Eye Quality was good. Central Foveal Thickness: 291. Progression has been stable. Findings include normal foveal contour, no IRF, no SRF (Mild cystic changes inferior macula). Notes *Images captured and stored on drive Diagnosis / Impression: OD: Focal SRF within schisis cavity -- schisis detachment superior periphery--not imaged today OS: Mild cystic changes inferior macula Clinical management: See below Abbreviations: NFP - Normal foveal profile. CME - cystoid macular edema. PED - pigment epithelial detachment.  IRF - intraretinal fluid. SRF - subretinal fluid. EZ - ellipsoid zone. ERM - epiretinal membrane. ORA - outer retinal atrophy. ORT - outer retinal tubulation. SRHM - subretinal hyper-reflective material. IRHM - intraretinal hyper-reflective material   OCT, Retina - OU - Both Eyes  Result Date: 07/02/2021 Right Eye Quality was good. Central  Foveal Thickness: 302. Progression has been stable. Findings include normal foveal contour, no IRF, subretinal fluid (Focal SRF within schisis cavity -- schisis detachment superior periphery--stable from prior). Left Eye Quality was good. Central Foveal Thickness: 288. Progression has been stable. Findings include normal foveal contour, no IRF, no SRF (Mild cystic changes inferior macula). Notes *Images captured and stored on drive Diagnosis / Impression: OD: Focal SRF within schisis cavity -- schisis detachment superior periphery--stable from prior OS: Mild cystic changes inferior macula Clinical management: See below Abbreviations: NFP - Normal foveal profile. CME - cystoid macular edema. PED - pigment epithelial detachment. IRF - intraretinal fluid. SRF - subretinal fluid. EZ - ellipsoid zone. ERM - epiretinal membrane. ORA - outer retinal atrophy. ORT - outer retinal tubulation. SRHM - subretinal hyper-reflective material. IRHM - intraretinal hyper-reflective material   OCT, Retina - OU - Both Eyes  Result Date: 06/29/2021 Right Eye Quality was good. Central Foveal Thickness: 297. Progression has no prior data. Findings include normal foveal contour, no IRF, subretinal fluid (Focal SRF within schisis cavity -- schisis detachment superior periphery). Left Eye Quality was good. Central Foveal Thickness: 290. Progression has no prior data. Findings include normal foveal contour, no IRF, no SRF (Mild cystic changes inferior macula). Notes *Images captured and stored on drive Diagnosis / Impression: OD: Focal SRF within schisis cavity -- schisis detachment superior periphery OS: Mild cystic changes inferior macula Clinical management: See below Abbreviations: NFP - Normal foveal profile. CME - cystoid macular edema. PED - pigment epithelial detachment. IRF - intraretinal fluid. SRF - subretinal fluid. EZ - ellipsoid zone. ERM - epiretinal membrane. ORA - outer retinal atrophy. ORT - outer retinal tubulation. SRHM  - subretinal hyper-reflective material. IRHM - intraretinal hyper-reflective material    ASSESSMENT: This is a very pleasant 61 years old white male with recurrent squamous cell carcinoma of the skin status post several Mohs surgeries in the past in addition to curative radiotherapy to locally advanced squamous cell carcinoma of the right cheek and nose under the care of Dr. Isidore Moos.   PLAN: I had a lengthy discussion with the patient and his wife today about his current condition and treatment options. I initially would have consider this patient for treatment with immunotherapy with Libtayo (Cempilimab) which showed excellent results and patient with a squamous cell carcinoma but after reviewing his records the patient had concerning findings that would contraindicate treatment with immunotherapy including his active Crohn's disease and currently on multiple medication in addition to nonalcoholic liver cirrhosis and history of autoimmune disorders. I recommend for the patient to continue his routine follow-up visit and evaluation with his dermatologist as well as the plastic surgeon.  He may benefit from additional radiation to other areas in the future.  If the patient develop any metastatic disease, he may be considered for systemic therapy likely with platinum and taxanes. I will see the patient on as-needed basis at this point but he knows to call immediately if he has any concerning symptoms. The patient and his wife are in agreement with the current plan. The patient voices understanding of current disease status and treatment options and is in agreement with the current care  plan.  All questions were answered. The patient knows to call the clinic with any problems, questions or concerns. We can certainly see the patient much sooner if necessary.  Thank you so much for allowing me to participate in the care of [name] [name]. I will continue to follow up the patient with you and assist in his  care.  The total time spent in the appointment was 60 minutes.  Disclaimer: This note was dictated with voice recognition software. Similar sounding words can inadvertently be transcribed and may not be corrected upon review.   Eilleen Kempf July 12, 2021, 11:49 AM

## 2021-07-13 ENCOUNTER — Encounter: Payer: Self-pay | Admitting: Gastroenterology

## 2021-07-17 DIAGNOSIS — K029 Dental caries, unspecified: Secondary | ICD-10-CM | POA: Diagnosis not present

## 2021-07-18 DIAGNOSIS — D0439 Carcinoma in situ of skin of other parts of face: Secondary | ICD-10-CM | POA: Diagnosis not present

## 2021-07-18 DIAGNOSIS — N200 Calculus of kidney: Secondary | ICD-10-CM | POA: Diagnosis not present

## 2021-07-18 DIAGNOSIS — N2889 Other specified disorders of kidney and ureter: Secondary | ICD-10-CM | POA: Diagnosis not present

## 2021-07-18 DIAGNOSIS — J310 Chronic rhinitis: Secondary | ICD-10-CM | POA: Diagnosis not present

## 2021-07-18 DIAGNOSIS — Z9889 Other specified postprocedural states: Secondary | ICD-10-CM | POA: Diagnosis not present

## 2021-07-18 DIAGNOSIS — N281 Cyst of kidney, acquired: Secondary | ICD-10-CM | POA: Diagnosis not present

## 2021-07-18 DIAGNOSIS — R220 Localized swelling, mass and lump, head: Secondary | ICD-10-CM | POA: Diagnosis not present

## 2021-07-18 DIAGNOSIS — C4492 Squamous cell carcinoma of skin, unspecified: Secondary | ICD-10-CM | POA: Diagnosis not present

## 2021-07-18 DIAGNOSIS — R161 Splenomegaly, not elsewhere classified: Secondary | ICD-10-CM | POA: Diagnosis not present

## 2021-07-18 DIAGNOSIS — K50118 Crohn's disease of large intestine with other complication: Secondary | ICD-10-CM | POA: Diagnosis not present

## 2021-07-21 DIAGNOSIS — D0439 Carcinoma in situ of skin of other parts of face: Secondary | ICD-10-CM | POA: Diagnosis not present

## 2021-07-21 DIAGNOSIS — C4492 Squamous cell carcinoma of skin, unspecified: Secondary | ICD-10-CM | POA: Diagnosis not present

## 2021-07-23 DIAGNOSIS — R296 Repeated falls: Secondary | ICD-10-CM | POA: Diagnosis not present

## 2021-07-23 DIAGNOSIS — E876 Hypokalemia: Secondary | ICD-10-CM | POA: Diagnosis not present

## 2021-07-23 DIAGNOSIS — D61818 Other pancytopenia: Secondary | ICD-10-CM | POA: Diagnosis not present

## 2021-07-23 DIAGNOSIS — C801 Malignant (primary) neoplasm, unspecified: Secondary | ICD-10-CM | POA: Diagnosis not present

## 2021-07-23 DIAGNOSIS — K7460 Unspecified cirrhosis of liver: Secondary | ICD-10-CM | POA: Diagnosis not present

## 2021-07-23 DIAGNOSIS — K5090 Crohn's disease, unspecified, without complications: Secondary | ICD-10-CM | POA: Diagnosis not present

## 2021-07-23 NOTE — Progress Notes (Signed)
[facility] Note  07/24/2021     CHIEF COMPLAINT Patient presents for Retina Follow Up   HISTORY OF PRESENT ILLNESS: [name] [name] is a 61 y.o. male who presents to the clinic today for:   HPI     Retina Follow Up   Patient presents with  Other.  In right eye.  This started 2 weeks ago.  I, the attending physician,  performed the HPI with the patient and updated documentation appropriately.        Comments   Patient here for 2 weeks retina follow up for retinoschisis OD. Patient states vision from last visit is a tiny bit worse. Right lower lid is swollen. Can see it when looks down. Has pain on occasion like a heart beat throb.      Last edited by Bernarda Caffey, MD on 07/27/2021 12:43 AM.    Pt had a CT of chest and neck last week, as well as oral surgery consultation, he has an appt with an oncologist this afternoon to see about trying immunotherapy drugs  Referring physician: [name], MD [address],  [place] [address]  HISTORICAL INFORMATION:   Selected notes from the MEDICAL RECORD NUMBER Referred by Dr Lucianne Lei for concern of shallow RD OD   CURRENT MEDICATIONS: Current Outpatient Medications (Ophthalmic Drugs)  Medication Sig   cycloSPORINE (RESTASIS) 0.05 % ophthalmic emulsion Place 1 drop into both eyes 2 (two) times daily.   latanoprost (XALATAN) 0.005 % ophthalmic solution Place 1 drop into both eyes at bedtime.    No current facility-administered medications for this visit. (Ophthalmic Drugs)   Current Outpatient Medications (Other)  Medication Sig   Calcium Carbonate-Vitamin D (CALCIUM 600+D PO) Take 1 tablet by mouth 2 (two) times daily.   chlorhexidine (PERIDEX) 0.12 % solution Use as directed 15 mLs in the mouth or throat in the morning, at noon, and at bedtime.   cholecalciferol (VITAMIN D) 1000 units tablet Take 1,000 Units at bedtime by mouth.    esomeprazole (NEXIUM) 40 MG capsule Take 1  capsule (40 mg total) by mouth 2 (two) times daily before a meal.   Eszopiclone 3 MG TABS Take 3 mg by mouth at bedtime. Take immediately before bedtime   feeding supplement (ENSURE ENLIVE / ENSURE PLUS) LIQD Take 237 mLs by mouth 2 (two) times daily between meals.   folic acid (FOLVITE) 1 MG tablet Take 2 tablets (2 mg total) by mouth daily.   glucose blood (CHOICE DM FORA G20 TEST STRIPS) test strip Use as instructed   Lactase (DAIRY RELIEF PO) Take 2 tablets by mouth in the morning and at bedtime.   lamoTRIgine (LAMICTAL) 150 MG tablet Take 1 tablet (150 mg total) by mouth 2 (two) times daily.   mercaptopurine (PURINETHOL) 50 MG tablet TAKE 2 TABLETS DAILY ON AN               EMPTY STOMACH 1 HOUR BEFORE              OR 2 HOURS AFTER A MEAL.                 CAUTION: CHEMOTHERAPY. NEED APPOINTMENT.   mesalamine (PENTASA) 500 MG CR capsule Take 1,000 mg by mouth daily.   methscopolamine (PAMINE FORTE) 5 MG tablet Take 1 tablet (5 mg total) by mouth 2 (two) times daily.   Multiple Vitamin (MULTIVITAMIN WITH MINERALS) TABS tablet Take 1 tablet by mouth daily.  ONE TOUCH ULTRA TEST test strip USE AS INSTRUCTED   potassium chloride SA (KLOR-CON) 20 MEQ tablet Take 1 tablet (20 mEq total) by mouth 2 (two) times daily.   Probiotic Product (PROBIOTIC PO) Take 1 tablet at bedtime by mouth.    rifaximin (XIFAXAN) 550 MG TABS tablet Take 1 tablet (550 mg total) by mouth 2 (two) times daily.   sertraline (ZOLOFT) 100 MG tablet Take 200 mg daily with breakfast by mouth.    tolterodine (DETROL LA) 4 MG 24 hr capsule Take 4 mg by mouth daily.   traZODone (DESYREL) 150 MG tablet Take 150 mg by mouth at bedtime.   imiquimod (ALDARA) 5 % cream Apply 1 packet topically 2 (two) times a week. (Patient not taking: Reported on 07/12/2021)   No current facility-administered medications for this visit. (Other)   Facility-Administered Medications Ordered in Other Visits (Other)  Medication Route   gadopentetate  dimeglumine (MAGNEVIST) injection 20 mL Intravenous   REVIEW OF SYSTEMS: ROS   Positive for: Gastrointestinal, Neurological, Skin, Musculoskeletal, Endocrine, Eyes, Heme/Lymph Negative for: Constitutional, Genitourinary, HENT, Cardiovascular, Respiratory, Psychiatric, Allergic/Imm Last edited by Theodore Demark, COA on 07/24/2021  9:44 AM.     ALLERGIES Allergies  Allergen Reactions   Azithromycin Swelling   Beef-Derived Products Diarrhea    RED MEAT > UNSPECIFIED REACTION    Milk-Related Compounds Diarrhea    DAIRY >UNSPECIFIED REACTION    Penicillins Hives    Rash as child- tolerates Ancef/CHILDHOOD ALLERGY Has patient had a PCN reaction causing immediate rash, facial/tongue/throat swelling, SOB or lightheadedness with hypotension: YES Has patient had a PCN reaction causing severe rash involving mucus membranes or skin necrosis: Unknown Has patient had a PCN reaction that required hospitalization: Unknown Has patient had a PCN reaction occurring within the last 10 years:Unknown If all of the above answers are "NO", then may proceed with Cephalosporin [place]   Sulfonamide Derivatives Hives   Dilaudid [Hydromorphone Hcl] Nausea And Vomiting    PAST MEDICAL HISTORY Past Medical History:  Diagnosis Date   Adenomatous colon polyp 10/1999   Anxiety    Arthritis    neck, yoga helps.   Cataract    Complex partial seizure ([place])    last seizure was 08-30-2018   Crohn's disease of small and large intestines ([place]) 1999   Depression    Diabetes mellitus    no meds at this time 10-24-17   Esophageal stricture    External hemorrhoids    GERD (gastroesophageal reflux disease)    Glaucoma    History of kidney stones    3 large stones still present   Hyperlipemia    Hypertension    resolved with weight loss    Inguinal hernia, bilateral 12/2019   Liver cirrhosis secondary to NASH ([place])    Migraines    Osteoporosis    PONV (postoperative nausea and vomiting)    PTSD (post-traumatic  stress disorder)    Renal cyst, acquired, left 07/08/2017   Skin cancer    squamous cell multiple; [place]; followed every 3 months.   Sleep apnea    CPAP machine, uses nightly   Staphylococcus aureus bacteremia with sepsis ([place]) 05/28/2017   MRSA 2012   Past Surgical History:  Procedure Laterality Date   CATARACT EXTRACTION Bilateral    COLONOSCOPY     DEBRIDEMENT MANDIBLE N/A 12/07/2020   Procedure: DEBRIDEMENT OF MANDIBLE;  Surgeon: [name], DMD;  Location: [facility];  Service: Oral Surgery;  Laterality: N/A;   ELBOW SURGERY  2012   elbow MRSA infection    EYE SURGERY     cataracts removed, /w IOL   GROSS TEETH DEBRIDEMENT N/A 12/29/2020   Procedure: MANDIBULAR  DEBRIDEMENT;  Surgeon: [name], DMD;  Location: [facility];  Service: Dentistry;  Laterality: N/A;   INCISION AND DRAINAGE ABSCESS N/A 12/07/2020   Procedure: INCISION AND DRAINAGE MANDIBULAR ABSCESS;  Surgeon: [name], DMD;  Location: [facility];  Service: Oral Surgery;  Laterality: N/A;   INGUINAL HERNIA REPAIR Right 01/14/2020   Procedure: OPEN RIGHT HERNIA REPAIR INGUINAL WITH MESH;  Surgeon: [name], MD;  Location: [facility];  Service: General;  Laterality: Right;   INSERTION OF MESH N/A 07/01/2016   Procedure: INSERTION OF MESH;  Surgeon: [name], MD;  Location: [facility];  Service: General;  Laterality: N/A;   IR URETERAL STENT LEFT NEW ACCESS W/O SEP NEPHROSTOMY CATH  02/02/2018   NEPHROLITHOTOMY Left 02/02/2018   Procedure: NEPHROLITHOTOMY PERCUTANEOUS;  Surgeon: [name], MD;  Location: [facility];  Service: Urology;  Laterality: Left;   POLYPECTOMY     SCALP LACERATION REPAIR Right 10/16/2017   From fall/staples   SHOULDER ARTHROSCOPY Right 03/05/2018   Procedure: ARTHROSCOPY SHOULDER AND OPEN DISTAL CLAVICLE EXCISION;  Surgeon: [name], MD;  Location: [facility];  Service: Orthopedics;  Laterality: Right;   SHOULDER SURGERY     SINUS SURGERY WITH INSTATRAK     TEE WITHOUT  CARDIOVERSION N/A 05/09/2017   Procedure: TRANSESOPHAGEAL ECHOCARDIOGRAM (TEE);  Surgeon: [name], MD;  Location[facility];  Service: Cardiovascular;  Laterality: N/A;   TOOTH EXTRACTION N/A 12/07/2020   Procedure: DENTAL EXTRACTIONS OF TEETH TWENTY TO THIRTY;  Surgeon: [name], DMD;  Location: [facility];  Service: Oral Surgery;  Laterality: N/A;   TOOTH EXTRACTION N/A 12/29/2020   Procedure: DENTAL RESTORATION/EXTRACTIONS;  Surgeon: [name], DMD;  Location: [facility];  Service: Dentistry;  Laterality: N/A;   TRANSTHORACIC ECHOCARDIOGRAM  02/22/2011   EF 55-65%; increased pattern of LVH with mild conc hypertrophy, abnormal relaxation & increased filling pressure (grade 2 diastolic dysfunction); atrial septum thickened (lipomatous hypertrophy)   UMBILICAL HERNIA REPAIR N/A 07/01/2016   Procedure: UMBILICAL HERNIA REPAIR WITH MESH;  Surgeon: [name], MD;  Location: [facility];  Service: General;  Laterality: N/A;   UPPER GASTROINTESTINAL ENDOSCOPY     VASECTOMY      FAMILY HISTORY Family History  Problem Relation Age of Onset   Heart disease Mother        CABG at age 8   Hyperlipidemia Mother    Hypertension Mother    Diabetes Father    Colon polyps Father    Stroke Father    Hyperlipidemia Father    Hypertension Father    Stroke Paternal Grandmother    Stroke Paternal Grandfather    Other Child        tetrology of fallot (cornealia deland syndrome)   Colon cancer Neg Hx    Esophageal cancer Neg Hx    Stomach cancer Neg Hx    Rectal cancer Neg Hx     SOCIAL HISTORY Social History   Tobacco Use   Smoking status: Never    Passive exposure: Never   Smokeless tobacco: Never  Vaping Use   Vaping Use: Never used  Substance Use Topics   Alcohol use: Never   Drug use: Never       OPHTHALMIC EXAM:  Base Eye Exam     Visual Acuity (Snellen - Linear)  Right Left   Dist [place] 20/40 20/50 -1   Dist ph [place] 20/40 -2         Tonometry (Tonopen,  9:41 AM)       Right Left   Pressure 06 05         Pupils       Dark Light Shape React APD   Right 3 2 Round Brisk None   Left 3 2 Round Brisk None         Visual Fields       Left Right   Restrictions Partial outer superior temporal, inferior temporal, inferior nasal deficiencies Partial outer superior temporal, inferior temporal, superior nasal, inferior nasal deficiencies         Extraocular Movement       Right Left    Full, Ortho Full, Ortho         Neuro/Psych     Oriented x3: Yes   Mood/Affect: Normal         Dilation     Both eyes: 1.0% Mydriacyl, 2.5% Phenylephrine @ 9:40 AM           Slit Lamp and Fundus Exam     External Exam       Right Left   External SCC right upper cheek          Slit Lamp Exam       Right Left   Lids/Lashes Dermatochalasis - upper lid Dermatochalasis - upper lid   Conjunctiva/Sclera White and quiet White and quiet   Cornea 1-2+ inferior Punctate epithelial erosions, tear film debris Clear   Anterior Chamber Deep and quiet Deep and quiet   Iris Round and dilated Round and dilated   Lens Toric PC IOL in good position with marks at 0600 and 1200 PC IOL in good position, 1+ Posterior capsular opacification   Anterior Vitreous Vitreous syneresis Vitreous syneresis         Fundus Exam       Right Left   Disc Pink and Sharp, thin inferior rim mild Pallor, Sharp rim   C/D Ratio 0.6 0.5   Macula Flat, Good foveal reflex, mild RPE mottling, no heme Flat, Blunted foveal reflex, mild RPE mottling   Vessels mild attenuation, mild Copper wiring, mild tortuousity mild attenuation, mild Copper wiring, mild tortuousity   Periphery Elevation from 1100-1230 --  schisis with focal pocket of SRF posteriorly (schisis RD, no RT on prior scleral depression) - improved -- now just shallow schisis w/o SRF, pigmented pavingstone IT quad, pigmented CR scarring and pavingstone temporally Attached, CR scarring and atrophy /  pavingstone ST / IT            IMAGING AND PROCEDURES  Imaging and Procedures for 07/24/2021  OCT, Retina - OU - Both Eyes       Right Eye Quality was good. Central Foveal Thickness: 302. Progression has been stable. Findings include normal foveal contour, no IRF, vitreomacular adhesion , no SRF (Focal SRF within schisis cavity -- schisis detachment superior periphery -- improved).   Left Eye Quality was good. Central Foveal Thickness: 295. Progression has been stable. Findings include normal foveal contour, no IRF, no SRF (Persistent cystic changes inferior macula).   Notes *Images captured and stored on drive  Diagnosis / Impression:  OD: Focal SRF within schisis cavity -- improved OS: persistent cystic changes inferior macula  Clinical management:  See below  Abbreviations: NFP - Normal foveal profile. CME - cystoid macular edema. PED -  pigment epithelial detachment. IRF - intraretinal fluid. SRF - subretinal fluid. EZ - ellipsoid zone. ERM - epiretinal membrane. ORA - outer retinal atrophy. ORT - outer retinal tubulation. SRHM - subretinal hyper-reflective material. IRHM - intraretinal hyper-reflective material            ASSESSMENT/PLAN:    ICD-10-CM   1. Right retinoschisis  H33.101 OCT, Retina - OU - Both Eyes    2. Right retinal detachment  H33.21     3. Post-radiation retinopathy, subsequent encounter  T66.XXXD    H35.89     4. Essential hypertension  I10     5. Hypertensive retinopathy of both eyes  H35.033     6. Pseudophakia, both eyes  Z96.1     7. Low-tension glaucoma of both eyes, mild stage  H40.1231       1-3. 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 Hypertensive retinopathy OU - discussed importance of tight BP control - monitor   6. Pseudophakia OD  - s/p CE/IOL  - IOL in good position, doing well  - monitor  7. Low tension glaucoma  - managed by Dr. Lucianne Lei  - IOP: 06,05  - on latanoprost qhs OU   Ophthalmic Meds Ordered this visit:  No orders of the defined types were placed in this encounter.    Return for f/u 6-8 weeks, retinoschisis OD, DFE, OCT.  There are no Patient Instructions on file for this visit.  Explained the diagnoses, plan, and follow up with the patient and they expressed understanding.  Patient expressed understanding of the importance of proper follow up care.   This document serves as a record of services personally performed by Gardiner Sleeper, MD, PhD. It was created on their behalf by Estill Bakes, COT an ophthalmic technician. The creation of this record is the provider's dictation and/or activities during the visit.    Electronically signed by: Estill Bakes, COT 1.30.23 @ 12:45 AM   This document serves as a record of services personally performed by Gardiner Sleeper, MD, PhD. It was created on their behalf by San Jetty. Owens Shark, OA an ophthalmic technician. The creation of this record is the provider's dictation and/or activities during the visit.    Electronically signed by: San Jetty. Owens Shark, [place] 01.31.2023 12:45 AM   Gardiner Sleeper, M.D., Ph.D. Diseases & Surgery of the Retina and Vitreous [facility]  I have reviewed the above documentation for accuracy and completeness, and I agree with the above. Gardiner Sleeper, M.D., Ph.D. 07/27/21 12:46 AM   Abbreviations: M myopia (nearsighted); A  astigmatism; H hyperopia (farsighted); P presbyopia; Mrx spectacle prescription;  CTL contact lenses; OD right eye; OS left eye; OU both eyes  XT exotropia; ET esotropia; PEK punctate epithelial keratitis; PEE punctate epithelial erosions; DES dry eye syndrome; MGD meibomian gland dysfunction; ATs artificial tears; PFAT's preservative free artificial tears; [place] nuclear sclerotic cataract; PSC posterior subcapsular cataract; ERM epi-retinal membrane; PVD posterior  vitreous detachment; RD retinal detachment; DM diabetes mellitus; DR diabetic retinopathy; NPDR non-proliferative diabetic retinopathy; PDR proliferative diabetic retinopathy; CSME clinically significant macular edema; DME diabetic macular edema; dbh dot blot hemorrhages; CWS cotton wool spot; POAG primary open angle glaucoma; C/D cup-to-disc ratio; HVF humphrey visual field; GVF goldmann visual field; OCT optical coherence tomography; IOP intraocular pressure; BRVO Branch retinal vein occlusion; CRVO central retinal vein occlusion; CRAO central retinal artery occlusion; BRAO branch retinal artery occlusion; RT retinal tear; SB scleral buckle; PPV pars plana vitrectomy; VH Vitreous hemorrhage; PRP panretinal laser photocoagulation; IVK intravitreal kenalog; VMT vitreomacular traction; MH Macular hole;  NVD neovascularization of the disc; NVE neovascularization elsewhere; AREDS age related eye disease study; ARMD age related macular degeneration; POAG primary open angle glaucoma; EBMD epithelial/anterior basement membrane dystrophy; ACIOL anterior chamber intraocular lens; IOL intraocular lens; PCIOL posterior chamber intraocular lens; Phaco/IOL phacoemulsification with intraocular lens placement; [place] photorefractive keratectomy; LASIK laser assisted in situ keratomileusis; HTN hypertension; DM diabetes mellitus; COPD chronic obstructive pulmonary disease

## 2021-07-24 ENCOUNTER — Ambulatory Visit (INDEPENDENT_AMBULATORY_CARE_PROVIDER_SITE_OTHER): Payer: Medicare Other | Admitting: Ophthalmology

## 2021-07-24 ENCOUNTER — Other Ambulatory Visit: Payer: Self-pay

## 2021-07-24 ENCOUNTER — Encounter (INDEPENDENT_AMBULATORY_CARE_PROVIDER_SITE_OTHER): Payer: Self-pay | Admitting: Ophthalmology

## 2021-07-24 DIAGNOSIS — Z961 Presence of intraocular lens: Secondary | ICD-10-CM | POA: Diagnosis not present

## 2021-07-24 DIAGNOSIS — H3321 Serous retinal detachment, right eye: Secondary | ICD-10-CM

## 2021-07-24 DIAGNOSIS — H409 Unspecified glaucoma: Secondary | ICD-10-CM | POA: Diagnosis not present

## 2021-07-24 DIAGNOSIS — D61818 Other pancytopenia: Secondary | ICD-10-CM | POA: Diagnosis not present

## 2021-07-24 DIAGNOSIS — R5383 Other fatigue: Secondary | ICD-10-CM | POA: Diagnosis not present

## 2021-07-24 DIAGNOSIS — H3589 Other specified retinal disorders: Secondary | ICD-10-CM

## 2021-07-24 DIAGNOSIS — H401231 Low-tension glaucoma, bilateral, mild stage: Secondary | ICD-10-CM | POA: Diagnosis not present

## 2021-07-24 DIAGNOSIS — I10 Essential (primary) hypertension: Secondary | ICD-10-CM | POA: Diagnosis not present

## 2021-07-24 DIAGNOSIS — H33101 Unspecified retinoschisis, right eye: Secondary | ICD-10-CM | POA: Diagnosis not present

## 2021-07-24 DIAGNOSIS — H35033 Hypertensive retinopathy, bilateral: Secondary | ICD-10-CM | POA: Diagnosis not present

## 2021-07-24 DIAGNOSIS — C44329 Squamous cell carcinoma of skin of other parts of face: Secondary | ICD-10-CM | POA: Diagnosis not present

## 2021-07-24 NOTE — Progress Notes (Signed)
° °                                                                                                                                                          °  Patient Name: [name] [name] MRN: [id number] DOB: November 29, 1960 Referring Physician: Vallarie Mare Date of Service: 07/05/2021 [facility] [place]                                                        End Of Treatment Note  Diagnoses: C44.329-Squamous cell carcinoma of skin of other parts of face  Cancer Staging:  Cancer Staging  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 Staging form: Cutaneous Carcinoma of the Head and Neck, AJCC 8th Edition - Pathologic stage from 05/08/2021: Stage III (rpT3, pN0, cM0) - Signed by [name], MD on 05/11/2021 Stage prefix: Recurrence Extraosseous extension: Absent  Intent: Curative  Radiation Treatment Dates: 05/23/2021 through 07/05/2021 Site Technique Total Dose (Gy) Dose per Fx (Gy) Completed Fx Beam Energies  Cheek, Right: HN_Rt_Cheek* IMRT 60/60 2 30/30 6X   *The right orbit /infraorbital nerve pathway was also included in the radiation volume  Narrative: The patient tolerated radiation therapy relatively well with exception to retinal irritation possibly related to treatment. He is following closely with an ophthalmologist.   Plan: The patient will follow-up with radiation oncology in 60mo. -----------------------------------  SEppie Gibson MD

## 2021-07-25 ENCOUNTER — Ambulatory Visit: Payer: Medicare Other | Attending: Family Medicine | Admitting: Physical Therapy

## 2021-07-25 ENCOUNTER — Encounter: Payer: Self-pay | Admitting: Physical Therapy

## 2021-07-25 DIAGNOSIS — R279 Unspecified lack of coordination: Secondary | ICD-10-CM | POA: Diagnosis not present

## 2021-07-25 DIAGNOSIS — M6281 Muscle weakness (generalized): Secondary | ICD-10-CM | POA: Insufficient documentation

## 2021-07-25 DIAGNOSIS — R2689 Other abnormalities of gait and mobility: Secondary | ICD-10-CM | POA: Diagnosis not present

## 2021-07-25 DIAGNOSIS — R296 Repeated falls: Secondary | ICD-10-CM | POA: Diagnosis not present

## 2021-07-25 NOTE — Therapy (Signed)
[facility] [place], [place], [address] Phone: [phone]   Fax:  [phone]  Physical Therapy Treatment  Patient Details  Name: [name] [name] MRN: [id number] Date of Birth: Sep 20, 1960 Referring Provider (PT): [name], MD   Encounter Date: 07/25/2021   PT End of Session - 07/25/21 0938     Visit Number 28    Date for PT Re-Evaluation 09/05/21    Authorization Type UHC Medicare    Authorization - Visit Number 28    Authorization - Number of Visits 60    Progress Note Due on Visit 30    PT Start Time 0935    PT Stop Time 1015    PT Time Calculation (min) 40 min    Activity Tolerance Patient tolerated treatment well    Behavior During Therapy [facility] for tasks assessed/performed             Past Medical History:  Diagnosis Date   Adenomatous colon polyp 10/1999   Anxiety    Arthritis    neck, yoga helps.   Cataract    Complex partial seizure ([place])    last seizure was 08-30-2018   Crohn's disease of small and large intestines ([place]) 1999   Depression    Diabetes mellitus    no meds at this time 10-24-17   Esophageal stricture    External hemorrhoids    GERD (gastroesophageal reflux disease)    Glaucoma    History of kidney stones    3 large stones still present   Hyperlipemia    Hypertension    resolved with weight loss    Inguinal hernia, bilateral 12/2019   Liver cirrhosis secondary to NASH ([place])    Migraines    Osteoporosis    PONV (postoperative nausea and vomiting)    PTSD (post-traumatic stress disorder)    Renal cyst, acquired, left 07/08/2017   Skin cancer    squamous cell multiple; [facility]; followed every 3 months.   Sleep apnea    CPAP machine, uses nightly   Staphylococcus aureus bacteremia with sepsis ([place]) 05/28/2017   MRSA 2012    Past Surgical History:  Procedure Laterality Date   CATARACT EXTRACTION Bilateral    COLONOSCOPY     DEBRIDEMENT MANDIBLE N/A 12/07/2020    Procedure: DEBRIDEMENT OF MANDIBLE;  Surgeon: [name], DMD;  Location: [facility];  Service: Oral Surgery;  Laterality: N/A;   ELBOW SURGERY  2012   elbow MRSA infection    EYE SURGERY     cataracts removed, /w IOL   GROSS TEETH DEBRIDEMENT N/A 12/29/2020   Procedure: MANDIBULAR  DEBRIDEMENT;  Surgeon: [name], DMD;  Location: [employer];  Service: Dentistry;  Laterality: N/A;   INCISION AND DRAINAGE ABSCESS N/A 12/07/2020   Procedure: INCISION AND DRAINAGE MANDIBULAR ABSCESS;  Surgeon: [name], DMD;  Location: [facility];  Service: Oral Surgery;  Laterality: N/A;   INGUINAL HERNIA REPAIR Right 01/14/2020   Procedure: OPEN RIGHT HERNIA REPAIR INGUINAL WITH MESH;  Surgeon: [name], MD;  [facility];  Service: General;  Laterality: Right;   INSERTION OF MESH N/A 07/01/2016   Procedure: INSERTION OF MESH;  Surgeon: [name], MD;  Location: [facility];  Service: General;  Laterality: N/A;   IR URETERAL STENT LEFT NEW ACCESS W/O SEP NEPHROSTOMY CATH  02/02/2018   NEPHROLITHOTOMY Left 02/02/2018   Procedure: NEPHROLITHOTOMY PERCUTANEOUS;  Surgeon: [name], MD;  [facility];  Service:  Urology;  Laterality: Left;   POLYPECTOMY     SCALP LACERATION REPAIR Right 10/16/2017   From fall/staples   SHOULDER ARTHROSCOPY Right 03/05/2018   Procedure: ARTHROSCOPY SHOULDER AND OPEN DISTAL CLAVICLE EXCISION;  Surgeon: [name], MD;  Location: [facility];  Service: Orthopedics;  Laterality: Right;   SHOULDER SURGERY     SINUS SURGERY WITH INSTATRAK     TEE WITHOUT CARDIOVERSION N/A 05/09/2017   Procedure: TRANSESOPHAGEAL ECHOCARDIOGRAM (TEE);  Surgeon: [name], MD;  Location: [facility];  Service: Cardiovascular;  Laterality: N/A;   TOOTH EXTRACTION N/A 12/07/2020   Procedure: DENTAL EXTRACTIONS OF TEETH TWENTY TO THIRTY;  Surgeon: [name], DMD;  Location: [facility];  Service: Oral Surgery;  Laterality: N/A;   TOOTH EXTRACTION N/A 12/29/2020   Procedure:  DENTAL RESTORATION/EXTRACTIONS;  Surgeon: [name], DMD;  Location: [facility];  Service: Dentistry;  Laterality: N/A;   TRANSTHORACIC ECHOCARDIOGRAM  02/22/2011   EF 55-65%; increased pattern of LVH with mild conc hypertrophy, abnormal relaxation & increased filling pressure (grade 2 diastolic dysfunction); atrial septum thickened (lipomatous hypertrophy)   UMBILICAL HERNIA REPAIR N/A 07/01/2016   Procedure: UMBILICAL HERNIA REPAIR WITH MESH;  Surgeon: [name], MD;  Location: [facility];  Service: General;  Laterality: N/A;   UPPER GASTROINTESTINAL ENDOSCOPY     VASECTOMY      There were no vitals filed for this visit.   Subjective Assessment - 07/25/21 0939     Subjective I continue to have many doctor appointments for my skin cancer.  I am doing ok.    Pertinent History on disability, has memory problems, liver cirrhosis, Chrons, osteoporosis with multiple fractures including C6, thrombocytopenia, gluacoma (loss of peripheral vision).  Has lost 75lb through walking but concerned about safety    Limitations Walking    How long can you walk comfortably? 5 miles with walking stick    Diagnostic tests C6 fracture and ligamentum flavum tear - non-surgical    Currently in Pain? No/denies                               [facility] Adult PT Treatment/Exercise - 07/25/21 0001       Exercises   Exercises Shoulder;Lumbar;Knee/Hip      Lumbar Exercises: Aerobic   Nustep L5 x 8' PT present to monitor and discuss plan for session      Knee/Hip Exercises: Standing   Forward Step Up Left;Right;1 set;10 reps;Step Height: 6";Hand Hold: 2    Forward Step Up Limitations march to 3rd step    Walking with Sports Cord blue tband bwd walking x 10 5" holds   close supervision   Gait Training farmer's carry Rt and Lt 20lb each x 160'                 Balance Exercises - 07/25/21 0001       Balance Exercises: Standing   Gait with Head Turns Forward;2 reps   alt with bwd  walking   Partial Tandem Stance Eyes closed;2 reps;30 secs   close supervision   Retro Gait 2 reps    Sidestepping 5 reps   yellow loop band x 5 steps Lt/Rt x 5 rounds   Lift / Chop 10 reps;Left;Right   blue plyo ball with weight shift and trunk rotation   Other Standing Exercises heel raises on blue foam with bil UE reach up wall x 10  PT Short Term Goals - 04/05/21 0926       PT SHORT TERM GOAL #1   Title Patient will complete 3 minute walk test using LRAD with only supervision to indicate decreased fall risk.    Status Achieved      PT SHORT TERM GOAL #2   Title Pt will verbalize and demo safe strategies for stair pattern (step to for descending) and gait patterns/pace (slow down) without AD    Status Achieved      PT SHORT TERM GOAL #3   Title PT and Pt will explore gait with walker and make decisions about appropriateness of use for improved safety.    Baseline using walking sticks, has had 1 fall since starting PT    Status Achieved      PT SHORT TERM GOAL #4   Title Pt ind with initial HEP    Status Achieved               PT Long Term Goals - 07/11/21 1136       PT LONG TERM GOAL #1   Title Improve 5x sit to stand to </= 14 sec.    Status Achieved      PT LONG TERM GOAL #2   Title Pt will demo up/down stairs x 3 rounds (4 6 inch steps) using bil UEs and safe strategy to demo improved safety on stairs    Status Achieved      PT LONG TERM GOAL #3   Title Pt will demo safety/no LOB with community ambulation for 6' walk test, curb up/down and grassy surface with supervision using least restrictive AD or no AD.    Baseline met for curb, weather prevents grassy surface practice    Status On-going      PT LONG TERM GOAL #4   Title Pt will report at least 50% more confidence in balance with daily tasks    Baseline 80%    Status Achieved      PT LONG TERM GOAL #5   Title Ind with advanced HEP and understand how to safely progress     Status On-going      Additional Long Term Goals   Additional Long Term Goals Yes      PT LONG TERM GOAL #6   Title Improved DGI score to at least 22/24 to reduce fall risk with dynamic gait challenges    Baseline 20/24    Status Revised    Target Date 09/05/21      PT LONG TERM GOAL #7   Title Pt able to successfully perform step through pattern over 4 hurdles without LOB at least 2 trials of 3.    Status New    Target Date 09/05/21                   Plan - 07/25/21 1331     Clinical Impression Statement Pt continues to show ongoing progress toward dynamic gait, balance and coordination challenges.  He was most challenged today with retro gait and stagger tandem stance with eyes closed.  Close supervision by PT with intermittent need for min A to regain balance needed with advanced challenges.  PT closely monitoring fatigue levels as Pt has started a new medication for ongoing skin cancer treatment.    Comorbidities skin cancer/ongoing radiation and possible upcoming chemo treatment, seizures, memory deficits, osteoporosis with Hx of multiple fractures    PT Frequency 1x / week    PT Duration 8 weeks  PT Treatment/Interventions ADLs/Self Care Home Management;Gait training;Stair training;Functional mobility training;Therapeutic activities;Therapeutic exercise;Balance training;Neuromuscular re-education;Manual techniques;Patient/family education;Passive range of motion    PT Next Visit Plan continue stability on single leg for hurdle step through stepovers, march cone taps, cognitive and coordination challenges, gait with head turns, stagger stance with eyes closed    PT Home Exercise Plan [id number]    Consulted and Agree with Plan of Care Patient             Patient will benefit from skilled therapeutic intervention in order to improve the following deficits and impairments:     Visit Diagnosis: Other abnormalities of gait and mobility  Muscle weakness  (generalized)  Repeated falls  Unspecified lack of coordination     Problem List Patient Active Problem List   Diagnosis Date Noted   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 05/11/2021   Liver cirrhosis secondary to NASH (H[facility])    GERD (gastroesophageal reflux disease)    Pancytopenia (H[facility])    Osteomyelitis of jaw 12/29/2020   Acute osteomyelitis of mandible 12/26/2020   S/P inguinal hernia repair 01/14/2020   At high risk for falls    S/P shoulder surgery 03/05/2018   Excessive daytime sleepiness 02/18/2018   Crohn's disease of colon with rectal bleeding ([place]) 02/18/2018   Blood in stool 02/18/2018   Iron deficiency anemia due to chronic blood loss 02/18/2018   Iron deficiency anemia due to sideropenic dysphagia 02/18/2018   Renal calculus, left 02/02/2018   History of sepsis 12/16/2017   Renal cyst, acquired, left 07/08/2017   Frequent falls 07/08/2017   Renal lesion 05/28/2017   MSSA bacteremia 05/06/2017   Confusion 04/02/2017   Syncope 08/30/2016   Depression 04/30/2016   Memory loss 04/30/2016   OSA on CPAP 10/31/2015   Nonepileptic episode ([place]) 04/25/2015   Seizure ([place]) 04/04/2013   Abnormal EKG 03/12/2013   DOE (dyspnea on exertion) 03/12/2013   Complex partial seizure ([place]) 04/19/2012   DM2 (diabetes mellitus, type 2) ([place]) 09/25/2011   OTHER DYSPHAGIA 03/27/2009   Cough 11/10/2008   GERD 12/07/2007   COLONIC POLYPS, ADENOMATOUS, HX OF 12/07/2007   EXTERNAL HEMORRHOIDS 09/11/2007   CROHN'S DISEASE, LARGE AND SMALL INTESTINES 09/11/2007   OSTEOPOROSIS 09/11/2007   HYPERLIPIDEMIA NEC/NOS 02/27/2007   Essential hypertension 02/27/2007    Baruch Merl, PT 07/25/21 1:33 PM   [facility] @ [place] [place], [place], [address] Phone: [phone]   Fax:  [phone]  Name: EDIBERTO SENS MRN: [id number] Date of Birth: 1961-03-10

## 2021-07-27 ENCOUNTER — Encounter (INDEPENDENT_AMBULATORY_CARE_PROVIDER_SITE_OTHER): Payer: Self-pay | Admitting: Ophthalmology

## 2021-07-30 DIAGNOSIS — F332 Major depressive disorder, recurrent severe without psychotic features: Secondary | ICD-10-CM | POA: Diagnosis not present

## 2021-07-30 DIAGNOSIS — F411 Generalized anxiety disorder: Secondary | ICD-10-CM | POA: Diagnosis not present

## 2021-07-30 DIAGNOSIS — F4312 Post-traumatic stress disorder, chronic: Secondary | ICD-10-CM | POA: Diagnosis not present

## 2021-07-31 DIAGNOSIS — K5090 Crohn's disease, unspecified, without complications: Secondary | ICD-10-CM | POA: Diagnosis not present

## 2021-07-31 DIAGNOSIS — C4492 Squamous cell carcinoma of skin, unspecified: Secondary | ICD-10-CM | POA: Diagnosis not present

## 2021-07-31 DIAGNOSIS — Z882 Allergy status to sulfonamides status: Secondary | ICD-10-CM | POA: Diagnosis not present

## 2021-07-31 DIAGNOSIS — Z5111 Encounter for antineoplastic chemotherapy: Secondary | ICD-10-CM | POA: Diagnosis not present

## 2021-07-31 DIAGNOSIS — Z881 Allergy status to other antibiotic agents status: Secondary | ICD-10-CM | POA: Diagnosis not present

## 2021-07-31 DIAGNOSIS — G40209 Localization-related (focal) (partial) symptomatic epilepsy and epileptic syndromes with complex partial seizures, not intractable, without status epilepticus: Secondary | ICD-10-CM | POA: Diagnosis not present

## 2021-07-31 DIAGNOSIS — Z1159 Encounter for screening for other viral diseases: Secondary | ICD-10-CM | POA: Diagnosis not present

## 2021-07-31 DIAGNOSIS — G893 Neoplasm related pain (acute) (chronic): Secondary | ICD-10-CM | POA: Diagnosis not present

## 2021-07-31 DIAGNOSIS — Z79899 Other long term (current) drug therapy: Secondary | ICD-10-CM | POA: Diagnosis not present

## 2021-07-31 DIAGNOSIS — H5711 Ocular pain, right eye: Secondary | ICD-10-CM | POA: Diagnosis not present

## 2021-08-01 ENCOUNTER — Encounter: Payer: Self-pay | Admitting: Physical Therapy

## 2021-08-01 ENCOUNTER — Ambulatory Visit: Payer: Medicare Other | Admitting: Physical Therapy

## 2021-08-01 ENCOUNTER — Other Ambulatory Visit: Payer: Self-pay

## 2021-08-01 DIAGNOSIS — R2689 Other abnormalities of gait and mobility: Secondary | ICD-10-CM

## 2021-08-01 DIAGNOSIS — M6281 Muscle weakness (generalized): Secondary | ICD-10-CM

## 2021-08-01 DIAGNOSIS — R296 Repeated falls: Secondary | ICD-10-CM

## 2021-08-01 DIAGNOSIS — R279 Unspecified lack of coordination: Secondary | ICD-10-CM

## 2021-08-01 NOTE — Therapy (Signed)
[facility] @ [place], [place], [address] Phone: [phone]   Fax:  [phone]  Physical Therapy Treatment  Patient Details  Name: [name] [name] MRN: [id number] Date of Birth: 12-21-1960 Referring Provider (PT): [name], MD   Encounter Date: 08/01/2021   PT End of Session - 08/01/21 0940     Visit Number 29    Date for PT Re-Evaluation 09/05/21    Authorization Type UHC Medicare    Authorization - Visit Number 28    Authorization - Number of Visits 60    Progress Note Due on Visit 30    PT Start Time 0930    PT Stop Time 1015    PT Time Calculation (min) 45 min    Equipment Utilized During Treatment Gait belt    Activity Tolerance Patient tolerated treatment well    Behavior During Therapy [facility] for tasks assessed/performed             Past Medical History:  Diagnosis Date   Adenomatous colon polyp 10/1999   Anxiety    Arthritis    neck, yoga helps.   Cataract    Complex partial seizure ([place])    last seizure was 08-30-2018   Crohn's disease of small and large intestines ([place]) 1999   Depression    Diabetes mellitus    no meds at this time 10-24-17   Esophageal stricture    External hemorrhoids    GERD (gastroesophageal reflux disease)    Glaucoma    History of kidney stones    3 large stones still present   Hyperlipemia    Hypertension    resolved with weight loss    Inguinal hernia, bilateral 12/2019   Liver cirrhosis secondary to NASH ([place])    Migraines    Osteoporosis    PONV (postoperative nausea and vomiting)    PTSD (post-traumatic stress disorder)    Renal cyst, acquired, left 07/08/2017   Skin cancer    squamous cell multiple; [facility]; followed every 3 months.   Sleep apnea    CPAP machine, uses nightly   Staphylococcus aureus bacteremia with sepsis ([place]) 05/28/2017   MRSA 2012    Past Surgical History:  Procedure Laterality Date   CATARACT EXTRACTION Bilateral     COLONOSCOPY     DEBRIDEMENT MANDIBLE N/A 12/07/2020   Procedure: DEBRIDEMENT OF MANDIBLE;  Surgeon: [name], DMD;  Location: [facility];  Service: Oral Surgery;  Laterality: N/A;   ELBOW SURGERY  2012   elbow MRSA infection    EYE SURGERY     cataracts removed, /w IOL   GROSS TEETH DEBRIDEMENT N/A 12/29/2020   Procedure: MANDIBULAR  DEBRIDEMENT;  Surgeon: [name], DMD;  Location: [facility];  Service: Dentistry;  Laterality: N/A;   INCISION AND DRAINAGE ABSCESS N/A 12/07/2020   Procedure: INCISION AND DRAINAGE MANDIBULAR ABSCESS;  Surgeon: [name], DMD;  Location: [employer];  Service: Oral Surgery;  Laterality: N/A;   INGUINAL HERNIA REPAIR Right 01/14/2020   Procedure: OPEN RIGHT HERNIA REPAIR INGUINAL WITH MESH;  Surgeon: [name], MD;  [facility];  Service: General;  Laterality: Right;   INSERTION OF MESH N/A 07/01/2016   Procedure: INSERTION OF MESH;  Surgeon: [name], MD;  Location: [facility];  Service: General;  Laterality: N/A;   IR URETERAL STENT LEFT NEW ACCESS W/O SEP NEPHROSTOMY CATH  02/02/2018   NEPHROLITHOTOMY Left 02/02/2018   Procedure: NEPHROLITHOTOMY PERCUTANEOUS;  Surgeon:  [name], MD;  [facility];  Service: Urology;  Laterality: Left;   POLYPECTOMY     SCALP LACERATION REPAIR Right 10/16/2017   From fall/staples   SHOULDER ARTHROSCOPY Right 03/05/2018   Procedure: ARTHROSCOPY SHOULDER AND OPEN DISTAL CLAVICLE EXCISION;  Surgeon: [name], MD;  Location: [facility];  Service: Orthopedics;  Laterality: Right;   SHOULDER SURGERY     SINUS SURGERY WITH INSTATRAK     TEE WITHOUT CARDIOVERSION N/A 05/09/2017   Procedure: TRANSESOPHAGEAL ECHOCARDIOGRAM (TEE);  Surgeon: [name], MD;  Location: [facility];  Service: Cardiovascular;  Laterality: N/A;   TOOTH EXTRACTION N/A 12/07/2020   Procedure: DENTAL EXTRACTIONS OF TEETH TWENTY TO THIRTY;  Surgeon: [name], DMD;  Location: [facility];  Service: Oral Surgery;  Laterality:  N/A;   TOOTH EXTRACTION N/A 12/29/2020   Procedure: DENTAL RESTORATION/EXTRACTIONS;  Surgeon: [name], DMD;  Location: [facility];  Service: Dentistry;  Laterality: N/A;   TRANSTHORACIC ECHOCARDIOGRAM  02/22/2011   EF 55-65%; increased pattern of LVH with mild conc hypertrophy, abnormal relaxation & increased filling pressure (grade 2 diastolic dysfunction); atrial septum thickened (lipomatous hypertrophy)   UMBILICAL HERNIA REPAIR N/A 07/01/2016   Procedure: UMBILICAL HERNIA REPAIR WITH MESH;  Surgeon: [name], MD;  Location: [facility];  Service: General;  Laterality: N/A;   UPPER GASTROINTESTINAL ENDOSCOPY     VASECTOMY      There were no vitals filed for this visit.   Subjective Assessment - 08/01/21 1010     Subjective I had my infusion treatment yesterday and am really off today.  Balance is off and my concentration for coordinating tasks is off.    Pertinent History on disability, has memory problems, liver cirrhosis, Chrons, osteoporosis with multiple fractures including C6, thrombocytopenia, gluacoma (loss of peripheral vision).  Has lost 75lb through walking but concerned about safety    How long can you walk comfortably? 5 miles with walking stick    Diagnostic tests C6 fracture and ligamentum flavum tear - non-surgical    Patient Stated Goals work on coming down and up stairs, balance    Currently in Pain? No/denies                               [facility] Adult PT Treatment/Exercise - 08/01/21 0001       Exercises   Exercises Shoulder;Lumbar;Knee/Hip      Lumbar Exercises: Aerobic   Nustep L5 x 8' PT present to monitor, end of session      Lumbar Exercises: Standing   Heel Raises 10 reps    Heel Raises Limitations on foam pad with overhead reach on wall    Row Strengthening;Theraband;15 reps    Theraband Level (Row) Level 4 (Blue)      Lumbar Exercises: Seated   Long Arc Quad on Chair Strengthening;Both;2 sets;15 reps;Weights    LAQ on Chair  Weights (lbs) 3    LAQ on Chair Limitations chair + pad      Knee/Hip Exercises: Standing   Gait Training 6' walk to warm up, gait belt for safety                 Balance Exercises - 08/01/21 0001       Balance Exercises: Standing   Retro Gait 5 reps;Theraband    Theraband Level (Retro Gait) Level 4 (Blue)    Sidestepping 5 reps   yellow loop band x 10 steps Lt/Rt  x 5 rounds   Lift / Chop 10 reps;Left;Right   blue plyo ball with weight shift and trunk rotation                 PT Short Term Goals - 04/05/21 0926       PT SHORT TERM GOAL #1   Title Patient will complete 3 minute walk test using LRAD with only supervision to indicate decreased fall risk.    Status Achieved      PT SHORT TERM GOAL #2   Title Pt will verbalize and demo safe strategies for stair pattern (step to for descending) and gait patterns/pace (slow down) without AD    Status Achieved      PT SHORT TERM GOAL #3   Title PT and Pt will explore gait with walker and make decisions about appropriateness of use for improved safety.    Baseline using walking sticks, has had 1 fall since starting PT    Status Achieved      PT SHORT TERM GOAL #4   Title Pt ind with initial HEP    Status Achieved               PT Long Term Goals - 07/11/21 1136       PT LONG TERM GOAL #1   Title Improve 5x sit to stand to </= 14 sec.    Status Achieved      PT LONG TERM GOAL #2   Title Pt will demo up/down stairs x 3 rounds (4 6 inch steps) using bil UEs and safe strategy to demo improved safety on stairs    Status Achieved      PT LONG TERM GOAL #3   Title Pt will demo safety/no LOB with community ambulation for 6' walk test, curb up/down and grassy surface with supervision using least restrictive AD or no AD.    Baseline met for curb, weather prevents grassy surface practice    Status On-going      PT LONG TERM GOAL #4   Title Pt will report at least 50% more confidence in balance with daily  tasks    Baseline 80%    Status Achieved      PT LONG TERM GOAL #5   Title Ind with advanced HEP and understand how to safely progress    Status On-going      Additional Long Term Goals   Additional Long Term Goals Yes      PT LONG TERM GOAL #6   Title Improved DGI score to at least 22/24 to reduce fall risk with dynamic gait challenges    Baseline 20/24    Status Revised    Target Date 09/05/21      PT LONG TERM GOAL #7   Title Pt able to successfully perform step through pattern over 4 hurdles without LOB at least 2 trials of 3.    Status New    Target Date 09/05/21                   Plan - 08/01/21 1011     Clinical Impression Statement Close supervision and use of gait belt today due to Pt having infusion treatment yesterday causing him to feel uncoordinated and off with balance.  He was able to peform 6' walk and alternating standing and seated ther ex without LOB.  At this time his skin cancer treatment is not impeding his attendance and participation in PT.  He will likely decline without  PT and in fact reported improved clarity, energy, strength and coordination by end of session today.    Comorbidities skin cancer/ongoing radiation and possible upcoming chemo treatment, seizures, memory deficits, osteoporosis with Hx of multiple fractures    PT Next Visit Plan continue stability on single leg for hurdle step through stepovers, march cone taps, cognitive and coordination challenges, gait with head turns, stagger stance with eyes closed    PT Home Exercise Plan [id number]    Consulted and Agree with Plan of Care Patient             Patient will benefit from skilled therapeutic intervention in order to improve the following deficits and impairments:     Visit Diagnosis: Other abnormalities of gait and mobility  Muscle weakness (generalized)  Repeated falls  Unspecified lack of coordination     Problem List Patient Active Problem List   Diagnosis Date  Noted   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 05/11/2021   Liver cirrhosis secondary to NASH (HCC)    GERD (gastroesophageal reflux disease)    Pancytopenia ([place])    Osteomyelitis of jaw 12/29/2020   Acute osteomyelitis of mandible 12/26/2020   S/P inguinal hernia repair 01/14/2020   At high risk for falls    S/P shoulder surgery 03/05/2018   Excessive daytime sleepiness 02/18/2018   Crohn's disease of colon with rectal bleeding ([place]) 02/18/2018   Blood in stool 02/18/2018   Iron deficiency anemia due to chronic blood loss 02/18/2018   Iron deficiency anemia due to sideropenic dysphagia 02/18/2018   Renal calculus, left 02/02/2018   History of sepsis 12/16/2017   Renal cyst, acquired, left 07/08/2017   Frequent falls 07/08/2017   Renal lesion 05/28/2017   MSSA bacteremia 05/06/2017   Confusion 04/02/2017   Syncope 08/30/2016   Depression 04/30/2016   Memory loss 04/30/2016   OSA on CPAP 10/31/2015   Nonepileptic episode ([place]) 04/25/2015   Seizure ([place]) 04/04/2013   Abnormal EKG 03/12/2013   DOE (dyspnea on exertion) 03/12/2013   Complex partial seizure ([place]) 04/19/2012   DM2 (diabetes mellitus, type 2) ([place]) 09/25/2011   OTHER DYSPHAGIA 03/27/2009   Cough 11/10/2008   GERD 12/07/2007   COLONIC POLYPS, ADENOMATOUS, HX OF 12/07/2007   EXTERNAL HEMORRHOIDS 09/11/2007   CROHN'S DISEASE, LARGE AND SMALL INTESTINES 09/11/2007   OSTEOPOROSIS 09/11/2007   HYPERLIPIDEMIA NEC/NOS 02/27/2007   Essential hypertension 02/27/2007   Baruch Merl, PT 08/01/21 10:15 AM   [facility] @ [place], [place], [address] Phone: [phone]   Fax:  [phone]  Name: [name] [name] MRN: [id number] Date of Birth: Dec 19, 1960

## 2021-08-02 NOTE — Progress Notes (Signed)
[name] [name] presents today for follow-up after completing radiation to his right cheek on 07/05/2021  Skin: Treatment area appears intact and well healed. New are of concern near right nares is red and inflamed (wife states it never healed after it was biopsied)  Pain: Headaches over right eye and pain with palpation to new mass near right nasal fold Dermatology F/U:  Other issues of note: Received first cycle of immunotherapy (cemiplimab) on 07/31/2021 at [facility]. Had CT neck/chest on 07/18/2021 as well as MRI of brain on 07/21/21 at [facility]. Reports ongoing headaches (mainly over his right eye); states he's usually able to manage without any kind of pharmacological intervention; Having difficulty eating anything more than soft/pureed foods. Denies difficulty sleeping or lingering fatigue throughout the day. Continues to be able to walk 4 miles daily.

## 2021-08-03 ENCOUNTER — Ambulatory Visit
Admission: RE | Admit: 2021-08-03 | Discharge: 2021-08-03 | Disposition: A | Discharge status: Home / Self Care | Payer: Medicare Other | Source: Ambulatory Visit | Attending: Radiation Oncology | Admitting: Radiation Oncology

## 2021-08-03 ENCOUNTER — Other Ambulatory Visit: Payer: Self-pay

## 2021-08-03 VITALS — BP 137/76 | HR 62 | Temp 97.4°F | Resp 18 | Ht 69.0 in | Wt 173.1 lb

## 2021-08-03 DIAGNOSIS — C44329 Squamous cell carcinoma of skin of other parts of face: Secondary | ICD-10-CM | POA: Insufficient documentation

## 2021-08-03 DIAGNOSIS — Z79899 Other long term (current) drug therapy: Secondary | ICD-10-CM | POA: Insufficient documentation

## 2021-08-03 DIAGNOSIS — Z923 Personal history of irradiation: Secondary | ICD-10-CM | POA: Insufficient documentation

## 2021-08-06 ENCOUNTER — Encounter: Payer: Self-pay | Admitting: Radiation Oncology

## 2021-08-06 NOTE — Progress Notes (Signed)
Radiation Oncology         [phone] ________________________________  Name: [name] [name] MRN: [id number]  Date: 08/03/2021  DOB: 01-09-1961  Follow-Up Visit Note  Outpatient  CC: [name], MD  [name], MD  Diagnosis and Prior Radiotherapy:    ICD-10-CM   1. 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  C44.329       Radiation Treatment Dates: 05/23/2021 through 07/05/2021 Site Technique Total Dose (Gy) Dose per Fx (Gy) Completed Fx Beam Energies  Cheek, Right: HN_Rt_Cheek* IMRT 60/60 2 30/30 6X  *The right orbit /infraorbital nerve pathway was also included in the radiation volume   CHIEF COMPLAINT: Here for follow-up and surveillance of skin cancer  Narrative:  The patient returns today for routine follow-up.  [name] [name] presents today for follow-up after completing radiation on 07/05/2021  Skin: Treatment area appears intact and well healed. New are of concern near right nares is red and inflamed (wife states it never healed after it was biopsied)  Pain: Headaches over right eye and pain with palpation to new mass near right nasal fold Dermatology F/U:  Other issues of note: Received first cycle of immunotherapy (cemiplimab) on 07/31/2021 at [facility]. Had CT neck/chest on 07/18/2021 as well as MRI of brain on 07/21/21 at [facility]. Reports ongoing headaches (mainly over his right eye); states he's usually able to manage without any kind of pharmacological intervention; Having difficulty eating anything more than soft/pureed foods. Denies difficulty sleeping or lingering fatigue throughout the day. Continues to be able to walk 4 miles daily.   He states Dr. Charolett Bumpers is considering eventual surgery for new mass near right nasal fold                              ALLERGIES:  is allergic to azithromycin, beef-derived products, milk-related compounds, penicillins, sulfonamide derivatives, and dilaudid [hydromorphone hcl].  Meds: Current Outpatient Medications  Medication Sig  Dispense Refill   Calcium Carbonate-Vitamin D (CALCIUM 600+D PO) Take 1 tablet by mouth 2 (two) times daily.     chlorhexidine (PERIDEX) 0.12 % solution Use as directed 15 mLs in the mouth or throat in the morning, at noon, and at bedtime. 120 mL 1   cholecalciferol (VITAMIN D) 1000 units tablet Take 1,000 Units at bedtime by mouth.      cycloSPORINE (RESTASIS) 0.05 % ophthalmic emulsion Place 1 drop into both eyes 2 (two) times daily.     esomeprazole (NEXIUM) 40 MG capsule Take 1 capsule (40 mg total) by mouth 2 (two) times daily before a meal. 180 capsule 3   Eszopiclone 3 MG TABS Take 3 mg by mouth at bedtime. Take immediately before bedtime     feeding supplement (ENSURE ENLIVE / ENSURE PLUS) LIQD Take 237 mLs by mouth 2 (two) times daily between meals. 660 mL 12   folic acid (FOLVITE) 1 MG tablet Take 2 tablets (2 mg total) by mouth daily. 180 tablet 3   glucose blood (CHOICE DM FORA G20 TEST STRIPS) test strip Use as instructed 100 each 12   imiquimod (ALDARA) 5 % cream Apply 1 packet topically 2 (two) times a week. (Patient not taking: Reported on 07/12/2021)     Lactase (DAIRY RELIEF PO) Take 2 tablets by mouth in the morning and at bedtime.     lamoTRIgine (LAMICTAL) 150 MG tablet Take 1 tablet (150 mg total) by mouth 2 (two) times daily. 180 tablet 3  latanoprost (XALATAN) 0.005 % ophthalmic solution Place 1 drop into both eyes at bedtime.      mercaptopurine (PURINETHOL) 50 MG tablet TAKE 2 TABLETS DAILY ON AN               EMPTY STOMACH 1 HOUR BEFORE              OR 2 HOURS AFTER A MEAL.                 CAUTION: CHEMOTHERAPY. NEED APPOINTMENT. (Patient not taking: Reported on 08/03/2021) 180 tablet 3   mesalamine (PENTASA) 500 MG CR capsule Take 1,000 mg by mouth daily.     methscopolamine (PAMINE FORTE) 5 MG tablet Take 1 tablet (5 mg total) by mouth 2 (two) times daily. 180 tablet 3   Multiple Vitamin (MULTIVITAMIN WITH MINERALS) TABS tablet Take 1 tablet by mouth daily. 30 tablet 0    ONE TOUCH ULTRA TEST test strip USE AS INSTRUCTED 100 each 2   potassium chloride SA (KLOR-CON) 20 MEQ tablet Take 1 tablet (20 mEq total) by mouth 2 (two) times daily. 180 tablet 3   Probiotic Product (PROBIOTIC PO) Take 1 tablet at bedtime by mouth.      rifaximin (XIFAXAN) 550 MG TABS tablet Take 1 tablet (550 mg total) by mouth 2 (two) times daily. 180 tablet 3   sertraline (ZOLOFT) 100 MG tablet Take 200 mg daily with breakfast by mouth.      tolterodine (DETROL LA) 4 MG 24 hr capsule Take 4 mg by mouth daily.     traZODone (DESYREL) 150 MG tablet Take 150 mg by mouth at bedtime.     No current facility-administered medications for this encounter.   Facility-Administered Medications Ordered in Other Encounters  Medication Dose Route Frequency Provider Last Rate Last Admin   gadopentetate dimeglumine (MAGNEVIST) injection 20 mL  20 mL Intravenous Once PRN Marcial Pacas, MD        Physical Findings: The patient is in no acute distress. Patient is alert and oriented.  height is 5' 9"  (1.753 m) and weight is 173 lb 2 oz (78.5 kg). His temporal temperature is 97.4 F (36.3 C) (abnormal). His blood pressure is 137/76 and his pulse is 62. His respiration is 18 and oxygen saturation is 100%. .     There is a nodular red skin lesion lateral to the right nares that is on the edge of his RT field (plan reviewed with patient).  Skin has healed well within radiation field. No excessive watering or erythema of right eye.  Lab Findings: Lab Results  Component Value Date   WBC 3.9 (L) 07/12/2021   HGB 9.7 (L) 07/12/2021   HCT 28.1 (L) 07/12/2021   MCV 107.3 (H) 07/12/2021   PLT 118 (L) 07/12/2021    Radiographic Findings: OCT, Retina - OU - Both Eyes  Result Date: 07/27/2021 Right Eye Quality was good. Central Foveal Thickness: 302. Progression has been stable. Findings include normal foveal contour, no IRF, vitreomacular adhesion , no SRF (Focal SRF within schisis cavity -- schisis detachment  superior periphery -- improved). Left Eye Quality was good. Central Foveal Thickness: 295. Progression has been stable. Findings include normal foveal contour, no IRF, no SRF (Persistent cystic changes inferior macula). Notes *Images captured and stored on drive Diagnosis / Impression: OD: Focal SRF within schisis cavity -- improved OS: persistent cystic changes inferior macula Clinical management: See below Abbreviations: NFP - Normal foveal profile. CME - cystoid macular edema. PED -  pigment epithelial detachment. IRF - intraretinal fluid. SRF - subretinal fluid. EZ - ellipsoid zone. ERM - epiretinal membrane. ORA - outer retinal atrophy. ORT - outer retinal tubulation. SRHM - subretinal hyper-reflective material. IRHM - intraretinal hyper-reflective material   OCT, Retina - OU - Both Eyes  Result Date: 07/10/2021 Right Eye Quality was good. Central Foveal Thickness: 299. Progression has been stable. Findings include normal foveal contour, no IRF, subretinal fluid, vitreomacular adhesion (Focal SRF within schisis cavity -- schisis detachment superior periphery--not imaged today). Left Eye Quality was good. Central Foveal Thickness: 291. Progression has been stable. Findings include normal foveal contour, no IRF, no SRF (Mild cystic changes inferior macula). Notes *Images captured and stored on drive Diagnosis / Impression: OD: Focal SRF within schisis cavity -- schisis detachment superior periphery--not imaged today OS: Mild cystic changes inferior macula Clinical management: See below Abbreviations: NFP - Normal foveal profile. CME - cystoid macular edema. PED - pigment epithelial detachment. IRF - intraretinal fluid. SRF - subretinal fluid. EZ - ellipsoid zone. ERM - epiretinal membrane. ORA - outer retinal atrophy. ORT - outer retinal tubulation. SRHM - subretinal hyper-reflective material. IRHM - intraretinal hyper-reflective material    Impression/Plan:  He has healed well from RT to right cheek /orbit  /infraorbital nerve pathway.  Unfortunately, there is a skin lesion at the edge of this field.  He is starting immunotherapy and I hope that he tolerates this well with good effect.   Dr. Charolett Bumpers may operate on this new lesion. Radiation is a possibility in this area, but dose may be somewhat limited due to proximity to previous RT target (lesion is on margin of previous field).  I will see him back in May, sooner PRN.  On date of service, in total, I spent 25 minutes on this encounter. Patient was seen in person.  _____________________________________   [name], MD

## 2021-08-08 ENCOUNTER — Other Ambulatory Visit: Payer: Self-pay

## 2021-08-08 ENCOUNTER — Encounter: Payer: Self-pay | Admitting: Physical Therapy

## 2021-08-08 ENCOUNTER — Ambulatory Visit: Payer: Medicare Other | Admitting: Gastroenterology

## 2021-08-08 ENCOUNTER — Ambulatory Visit: Payer: Medicare Other | Admitting: Physical Therapy

## 2021-08-08 DIAGNOSIS — R279 Unspecified lack of coordination: Secondary | ICD-10-CM | POA: Diagnosis not present

## 2021-08-08 DIAGNOSIS — R2689 Other abnormalities of gait and mobility: Secondary | ICD-10-CM | POA: Diagnosis not present

## 2021-08-08 DIAGNOSIS — R296 Repeated falls: Secondary | ICD-10-CM | POA: Diagnosis not present

## 2021-08-08 DIAGNOSIS — M6281 Muscle weakness (generalized): Secondary | ICD-10-CM | POA: Diagnosis not present

## 2021-08-08 NOTE — Therapy (Signed)
[facility] [place], [place], [address] Phone: [phone]   Fax:  [phone]  Physical Therapy Treatment  Patient Details  Name: [name] [name] MRN: [id number] Date of Birth: [birth year] Referring Provider (PT): [name], MD   Encounter Date: 08/08/2021   PT End of Session - 08/08/21 1237     Visit Number 30    Date for PT Re-Evaluation 09/05/21    Authorization Type [employer] Medicare    Authorization - Visit Number 30    Authorization - Number of Visits 60    Progress Note Due on Visit 50    PT Start Time 2878    PT Stop Time 1316    PT Time Calculation (min) 41 min    Activity Tolerance Patient tolerated treatment well    Behavior During Therapy [facility] for tasks assessed/performed             Past Medical History:  Diagnosis Date   Adenomatous colon polyp 10/1999   Anxiety    Arthritis    neck, yoga helps.   Cataract    Complex partial seizure ([place])    last seizure was 08-30-2018   Crohn's disease of small and large intestines ([place]) 1999   Depression    Diabetes mellitus    no meds at this time 10-24-17   Esophageal stricture    External hemorrhoids    GERD (gastroesophageal reflux disease)    Glaucoma    History of kidney stones    3 large stones still present   Hyperlipemia    Hypertension    resolved with weight loss    Inguinal hernia, bilateral 12/2019   Liver cirrhosis secondary to NASH ([place])    Migraines    Osteoporosis    PONV (postoperative nausea and vomiting)    PTSD (post-traumatic stress disorder)    Renal cyst, acquired, left 07/08/2017   Skin cancer    squamous cell multiple; [facility]; followed every 3 months.   Sleep apnea    CPAP machine, uses nightly   Staphylococcus aureus bacteremia with sepsis ([place]) 05/28/2017   MRSA 2012    Past Surgical History:  Procedure Laterality Date   CATARACT EXTRACTION Bilateral    COLONOSCOPY     DEBRIDEMENT MANDIBLE N/A 12/07/2020    Procedure: DEBRIDEMENT OF MANDIBLE;  Surgeon: [name], DMD;  Location: [place];  Service: Oral Surgery;  Laterality: N/A;   ELBOW SURGERY  2012   elbow MRSA infection    EYE SURGERY     cataracts removed, /w IOL   GROSS TEETH DEBRIDEMENT N/A 12/29/2020   Procedure: MANDIBULAR  DEBRIDEMENT;  Surgeon: [name], DMD;  Location: [place];  Service: Dentistry;  Laterality: N/A;   INCISION AND DRAINAGE ABSCESS N/A 12/07/2020   Procedure: INCISION AND DRAINAGE MANDIBULAR ABSCESS;  Surgeon: [name], DMD;  Location: [place];  Service: Oral Surgery;  Laterality: N/A;   INGUINAL HERNIA REPAIR Right 01/14/2020   Procedure: OPEN RIGHT HERNIA REPAIR INGUINAL WITH MESH;  Surgeon: [name], MD;  Location: [facility];  Service: General;  Laterality: Right;   INSERTION OF MESH N/A 07/01/2016   Procedure: INSERTION OF MESH;  Surgeon: [name], MD;  Location: [place];  Service: General;  Laterality: N/A;   IR URETERAL STENT LEFT NEW ACCESS W/O SEP NEPHROSTOMY CATH  02/02/2018   NEPHROLITHOTOMY Left 02/02/2018   Procedure: NEPHROLITHOTOMY PERCUTANEOUS;  Surgeon: [name], MD;  Location: [facility];  Service:  Urology;  Laterality: Left;   POLYPECTOMY     SCALP LACERATION REPAIR Right 10/16/2017   From fall/staples   SHOULDER ARTHROSCOPY Right 03/05/2018   Procedure: ARTHROSCOPY SHOULDER AND OPEN DISTAL CLAVICLE EXCISION;  Surgeon: [name], MD;  Location: [place];  Service: Orthopedics;  Laterality: Right;   SHOULDER SURGERY     SINUS SURGERY WITH INSTATRAK     TEE WITHOUT CARDIOVERSION N/A 05/09/2017   Procedure: TRANSESOPHAGEAL ECHOCARDIOGRAM (TEE);  Surgeon: [name], MD;  [facility];  Service: Cardiovascular;  Laterality: N/A;   TOOTH EXTRACTION N/A 12/07/2020   Procedure: DENTAL EXTRACTIONS OF TEETH TWENTY TO THIRTY;  Surgeon: [name], DMD;  Location: [place];  Service: Oral Surgery;  Laterality: N/A;   TOOTH EXTRACTION N/A 12/29/2020   Procedure:  DENTAL RESTORATION/EXTRACTIONS;  Surgeon: [name], DMD;  Location: [facility];  Service: Dentistry;  Laterality: N/A;   TRANSTHORACIC ECHOCARDIOGRAM  02/22/2011   EF 55-65%; increased pattern of LVH with mild conc hypertrophy, abnormal relaxation & increased filling pressure (grade 2 diastolic dysfunction); atrial septum thickened (lipomatous hypertrophy)   UMBILICAL HERNIA REPAIR N/A 07/01/2016   Procedure: UMBILICAL HERNIA REPAIR WITH MESH;  Surgeon: [name], MD;  Location: [facility];  Service: General;  Laterality: N/A;   UPPER GASTROINTESTINAL ENDOSCOPY     VASECTOMY      There were no vitals filed for this visit.   Subjective Assessment - 08/08/21 1240     Subjective I am ok.  I am going to have more surgery on my face for skin cancer.  Should be late Feb.  I fell while taking a walk late last week.    Pertinent History on disability, has memory problems, liver cirrhosis, Ch[place], osteoporosis with multiple fractures including C6, thrombocytopenia, gluacoma (loss of peripheral vision).  Has lost 75lb through walking but concerned about safety    Limitations Walking    How long can you walk comfortably? 5 miles with walking stick    Diagnostic tests C6 fracture and ligamentum flavum tear - non-surgical    Patient Stated Goals work on coming down and up stairs, balance    Currently in Pain? No/denies    Pain Score 0-No pain                [facility] PT Assessment - 08/08/21 0001       Strength   Overall Strength Comments LEs WNL bil      Ambulation/Gait   Gait Pattern Step-through pattern;Decreased arm swing - right;Decreased arm swing - left;Decreased trunk rotation    Curb 6: Modified independent (Device/increase time)   uses lateral step down and step up successfully     Dynamic Gait Index   Level Surface Normal    Change in Gait Speed Normal    Gait with Horizontal Head Turns Mild Impairment   improves with repeated practice within each session   Gait with Vertical  Head Turns Normal   stops walking to change head position   Gait and Pivot Turn Normal    Step Over Obstacle Mild Impairment    Step Around Obstacles Normal    Steps Normal    Total Score 22    DGI comment: 22/24                           [facility] Adult PT Treatment/Exercise - 08/08/21 0001       Ambulation/Gait   Gait Comments practiced 6' walk outdoors  today with hill, curb up/down x 5, grassy surface incline and decline, uneven surface - gait belt for safety, no LOB      Exercises   Exercises Knee/Hip      Knee/Hip Exercises: Seated   Sit to Sand 20 reps;without UE support   2x10 hold 20lb kbell                Balance Exercises - 08/08/21 0001       Balance Exercises: Standing   Step Over Hurdles / [place]es hurdle step overs 3 rounds each: step to leading Lt, step to leading Rt, step through with pause for control, gait belt, single LOB with first rep of each, then improved with repeated practice    Heel Raises 10 reps   on blue foam with bil UE reach up wall                 PT Short Term Goals - 04/05/21 0926       PT SHORT TERM GOAL #1   Title Patient will complete 3 minute walk test using LRAD with only supervision to indicate decreased fall risk.    Status Achieved      PT SHORT TERM GOAL #2   Title Pt will verbalize and demo safe strategies for stair pattern (step to for descending) and gait patterns/pace (slow down) without AD    Status Achieved      PT SHORT TERM GOAL #3   Title PT and Pt will explore gait with walker and make decisions about appropriateness of use for improved safety.    Baseline using walking sticks, has had 1 fall since starting PT    Status Achieved      PT SHORT TERM GOAL #4   Title Pt ind with initial HEP    Status Achieved               PT Long Term Goals - 08/08/21 1242       PT LONG TERM GOAL #1   Title Improve 5x sit to stand to </= 14 sec.    Status Achieved      PT LONG TERM GOAL #2    Title Pt will demo up/down stairs x 3 rounds (4 6 inch steps) using bil UEs and safe strategy to demo improved safety on stairs    Baseline step to pattern, no handrails, 1 LOB having to put hand on the wall    Status Achieved      PT LONG TERM GOAL #3   Title Pt will demo safety/no LOB with community ambulation for 6' walk test, curb up/down and grassy surface with supervision using least restrictive AD or no AD.    Baseline practiced 6' walk outdoors today with hill, curb up/down x 5, grassy surface    Status On-going      PT LONG TERM GOAL #4   Title Pt will report at least 50% more confidence in balance with daily tasks    Status Achieved      PT LONG TERM GOAL #5   Title Ind with advanced HEP and understand how to safely progress    Status On-going      PT LONG TERM GOAL #6   Title Improved DGI score to at least 22/24 to reduce fall risk with dynamic gait challenges    Status Achieved      PT LONG TERM GOAL #7   Title Pt able to successfully perform step through pattern over 4 hurdles  without LOB at least 2 trials of 3.    Status On-going                   Plan - 08/08/21 1321     Clinical Impression Statement Pt continues to have ongoing complex medical history with skin cancer, infusion therapy and surgeries.  He did have a fall while taking exercise walk last week.  No injury and able to rise from ground on own.  He is improving in dynamic gait tasks and tends to need gait belt and min/mod A with higher level challenges for first rep of each task but improves in coordination and task performance with repeated reps.  He demo'd improved step overs and gait with head turns today, gaining points for improved DGI score of 22/24.  He participated in 6' walk test today performing curbs, hills, uneven grassy surfaces with close supervision.  He continues to be unsafe stepping off grassy surface curb, using side step off technique.  He is challenged by coordination using  alteranting opposite sides of the body, such as step up/up/down/down over foam pad today.  He continues to participate fully in PT despite ongoing infusion treatments with side effects.  He will continue to benefit from skilled PT for improved safety, reduced fall risk, improved task coordination, and functional mobility.  He has high fall risk and is at risk of regression without PT.    Comorbidities skin cancer/ongoing radiation and possible upcoming chemo treatment, seizures, memory deficits, osteoporosis with Hx of multiple fractures    PT Frequency 1x / week    PT Duration 8 weeks    PT Treatment/Interventions ADLs/Self Care Home Management;Gait training;Stair training;Functional mobility training;Therapeutic activities;Therapeutic exercise;Balance training;Neuromuscular re-education;Manual techniques;Patient/family education;Passive range of motion    PT Next Visit Plan continue stability on single leg for hurdle step through stepovers, march cone taps, cognitive and coordination challenges, gait with head turns, stagger stance with eyes closed    PT Home Exercise Plan [id number]    Consulted and Agree with Plan of Care Patient             Patient will benefit from skilled therapeutic intervention in order to improve the following deficits and impairments:     Visit Diagnosis: Other abnormalities of gait and mobility  Muscle weakness (generalized)  Repeated falls     Problem List Patient Active Problem List   Diagnosis Date Noted   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 05/11/2021   Liver cirrhosis secondary to NASH ([place])    GERD (gastroesophageal reflux disease)    Pancytopenia ([place])    Osteomyelitis of jaw 12/29/2020   Acute osteomyelitis of mandible 12/26/2020   S/P inguinal hernia repair 01/14/2020   At high risk for falls    S/P shoulder surgery 03/05/2018   Excessive daytime sleepiness 02/18/2018   Crohn's disease of colon with rectal bleeding ([place])  02/18/2018   Blood in stool 02/18/2018   Iron deficiency anemia due to chronic blood loss 02/18/2018   Iron deficiency anemia due to sideropenic dysphagia 02/18/2018   Renal calculus, left 02/02/2018   History of sepsis 12/16/2017   Renal cyst, acquired, left 07/08/2017   Frequent falls 07/08/2017   Renal lesion 05/28/2017   MSSA bacteremia 05/06/2017   Confusion 04/02/2017   Syncope 08/30/2016   Depression 04/30/2016   Memory loss 04/30/2016   OSA on CPAP 10/31/2015   Nonepileptic episode ([place]) 04/25/2015   Seizure ([place]) 04/04/2013   Abnormal EKG 03/12/2013  DOE (dyspnea on exertion) 03/12/2013   Complex partial seizure ([place]) 04/19/2012   DM2 (diabetes mellitus, type 2) ([place]) 09/25/2011   OTHER DYSPHAGIA 03/27/2009   Cough 11/10/2008   GERD 12/07/2007   COLONIC POLYPS, ADENOMATOUS, HX OF 12/07/2007   EXTERNAL HEMORRHOIDS 09/11/2007   CROHN'S DISEASE, LARGE AND SMALL INTESTINES 09/11/2007   OSTEOPOROSIS 09/11/2007   HYPERLIPIDEMIA NEC/NOS 02/27/2007   Essential hypertension 02/27/2007   Baruch Merl, PT 08/08/21 1:28 PM   [facility] @ [place], [place], [address] Phone: [phone]   Fax:  [phone]  Name: LAVARR PRESIDENT MRN: [id number] Date of Birth: Jun 27, 1960

## 2021-08-09 DIAGNOSIS — K50118 Crohn's disease of large intestine with other complication: Secondary | ICD-10-CM | POA: Diagnosis not present

## 2021-08-09 DIAGNOSIS — C4492 Squamous cell carcinoma of skin, unspecified: Secondary | ICD-10-CM | POA: Diagnosis not present

## 2021-08-09 DIAGNOSIS — D84821 Immunodeficiency due to drugs: Secondary | ICD-10-CM | POA: Diagnosis not present

## 2021-08-09 DIAGNOSIS — E611 Iron deficiency: Secondary | ICD-10-CM | POA: Diagnosis not present

## 2021-08-09 DIAGNOSIS — Z79899 Other long term (current) drug therapy: Secondary | ICD-10-CM | POA: Diagnosis not present

## 2021-08-09 DIAGNOSIS — K7460 Unspecified cirrhosis of liver: Secondary | ICD-10-CM | POA: Diagnosis not present

## 2021-08-15 ENCOUNTER — Other Ambulatory Visit: Payer: Self-pay

## 2021-08-15 ENCOUNTER — Ambulatory Visit: Payer: Medicare Other | Admitting: Physical Therapy

## 2021-08-15 ENCOUNTER — Encounter: Payer: Self-pay | Admitting: Physical Therapy

## 2021-08-15 DIAGNOSIS — R2689 Other abnormalities of gait and mobility: Secondary | ICD-10-CM | POA: Diagnosis not present

## 2021-08-15 DIAGNOSIS — M6281 Muscle weakness (generalized): Secondary | ICD-10-CM

## 2021-08-15 DIAGNOSIS — L821 Other seborrheic keratosis: Secondary | ICD-10-CM | POA: Diagnosis not present

## 2021-08-15 DIAGNOSIS — R296 Repeated falls: Secondary | ICD-10-CM | POA: Diagnosis not present

## 2021-08-15 DIAGNOSIS — Z85828 Personal history of other malignant neoplasm of skin: Secondary | ICD-10-CM | POA: Diagnosis not present

## 2021-08-15 DIAGNOSIS — R279 Unspecified lack of coordination: Secondary | ICD-10-CM | POA: Diagnosis not present

## 2021-08-15 DIAGNOSIS — R21 Rash and other nonspecific skin eruption: Secondary | ICD-10-CM | POA: Diagnosis not present

## 2021-08-15 DIAGNOSIS — C44622 Squamous cell carcinoma of skin of right upper limb, including shoulder: Secondary | ICD-10-CM | POA: Diagnosis not present

## 2021-08-15 DIAGNOSIS — D1801 Hemangioma of skin and subcutaneous tissue: Secondary | ICD-10-CM | POA: Diagnosis not present

## 2021-08-15 DIAGNOSIS — L570 Actinic keratosis: Secondary | ICD-10-CM | POA: Diagnosis not present

## 2021-08-15 DIAGNOSIS — C44321 Squamous cell carcinoma of skin of nose: Secondary | ICD-10-CM | POA: Diagnosis not present

## 2021-08-15 NOTE — Therapy (Signed)
[place] @ [address] [place], [place], [address] Phone: [phone]   Fax:  [phone]  Physical Therapy Treatment  Patient Details  Name: [name] [name] MRN: [id number] Date of Birth: 1960-07-16 Referring Provider (PT): [name], MD   Encounter Date: 08/15/2021   PT End of Session - 08/15/21 1235     Visit Number 31    Date for PT Re-Evaluation 09/05/21    Authorization Type U[employer] Medicare    Authorization - Visit Number 31    Authorization - Number of Visits 60    Progress Note Due on Visit 1    PT Start Time 1232    PT Stop Time 1310    PT Time Calculation (min) 38 min    Equipment Utilized During Treatment Gait belt    Activity Tolerance Patient tolerated treatment well    Behavior During Therapy [facility] for tasks assessed/performed             Past Medical History:  Diagnosis Date   Adenomatous colon polyp 10/1999   Anxiety    Arthritis    neck, yoga helps.   Cataract    Complex partial seizure ([place])    last seizure was 08-30-2018   Crohn's disease of small and large intestines ([place]) 1999   Depression    Diabetes mellitus    no meds at this time 10-24-17   Esophageal stricture    External hemorrhoids    GERD (gastroesophageal reflux disease)    Glaucoma    History of kidney stones    3 large stones still present   Hyperlipemia    Hypertension    resolved with weight loss    Inguinal hernia, bilateral 12/2019   Liver cirrhosis secondary to NASH ([place])    Migraines    Osteoporosis    PONV (postoperative nausea and vomiting)    PTSD (post-traumatic stress disorder)    Renal cyst, acquired, left 07/08/2017   Skin cancer    squamous cell multiple; [place]; followed every 3 months.   Sleep apnea    CPAP machine, uses nightly   Staphylococcus aureus bacteremia with sepsis ([place]) 05/28/2017   MRSA 2012    Past Surgical History:  Procedure Laterality Date   CATARACT EXTRACTION Bilateral     COLONOSCOPY     DEBRIDEMENT MANDIBLE N/A 12/07/2020   Procedure: DEBRIDEMENT OF MANDIBLE;  Surgeon: [name], DMD;  Location: [place];  Service: Oral Surgery;  Laterality: N/A;   ELBOW SURGERY  2012   elbow MRSA infection    EYE SURGERY     cataracts removed, /w IOL   GROSS TEETH DEBRIDEMENT N/A 12/29/2020   Procedure: MANDIBULAR  DEBRIDEMENT;  Surgeon: [name], DMD;  Location: [place];  Service: Dentistry;  Laterality: N/A;   INCISION AND DRAINAGE ABSCESS N/A 12/07/2020   Procedure: INCISION AND DRAINAGE MANDIBULAR ABSCESS;  Surgeon: [name], DMD;  Location: [place];  Service: Oral Surgery;  Laterality: N/A;   INGUINAL HERNIA REPAIR Right 01/14/2020   Procedure: OPEN RIGHT HERNIA REPAIR INGUINAL WITH MESH;  Surgeon: [name], MD;  [facility];  Service: General;  Laterality: Right;   INSERTION OF MESH N/A 07/01/2016   Procedure: INSERTION OF MESH;  Surgeon: [name], MD;  Location: [place];  Service: General;  Laterality: N/A;   IR URETERAL STENT LEFT NEW ACCESS W/O SEP NEPHROSTOMY CATH  02/02/2018   NEPHROLITHOTOMY Left 02/02/2018   Procedure: NEPHROLITHOTOMY PERCUTANEOUS;  Surgeon:  [name], MD;  [facility];  Service: Urology;  Laterality: Left;   POLYPECTOMY     SCALP LACERATION REPAIR Right 10/16/2017   From fall/staples   SHOULDER ARTHROSCOPY Right 03/05/2018   Procedure: ARTHROSCOPY SHOULDER AND OPEN DISTAL CLAVICLE EXCISION;  Surgeon: [name], MD;  Location: [place];  Service: Orthopedics;  Laterality: Right;   SHOULDER SURGERY     SINUS SURGERY WITH INSTATRAK     TEE WITHOUT CARDIOVERSION N/A 05/09/2017   Procedure: TRANSESOPHAGEAL ECHOCARDIOGRAM (TEE);  Surgeon: [name], MD;  [facility]: [facility] ENDOS[facility];  Service: Cardiovascular;  Laterality: N/A;   TOOTH EXTRACTION N/A 12/07/2020   Procedure: DENTAL EXTRACTIONS OF TEETH TWENTY TO THIRTY;  Surgeon: [name], DMD;  Location: [place];  Service: Oral Surgery;  Laterality:  N/A;   TOOTH EXTRACTION N/A 12/29/2020   Procedure: DENTAL RESTORATION/EXTRACTIONS;  Surgeon: [name], DMD;  Location: [place];  Service: Dentistry;  Laterality: N/A;   TRANSTHORACIC ECHOCARDIOGRAM  02/22/2011   EF 55-65%; increased pattern of LVH with mild conc hypertrophy, abnormal relaxation & increased filling pressure (grade 2 diastolic dysfunction); atrial septum thickened (lipomatous hypertrophy)   UMBILICAL HERNIA REPAIR N/A 07/01/2016   Procedure: UMBILICAL HERNIA REPAIR WITH MESH;  Surgeon: [name], MD;  Location: [facility];  Service: General;  Laterality: N/A;   UPPER GASTROINTESTINAL ENDOSCOPY     VASECTOMY      There were no vitals filed for this visit.                      [facility] Adult PT Treatment/Exercise - 08/15/21 0001       Exercises   Exercises Knee/Hip;Lumbar;Shoulder      Lumbar Exercises: Aerobic   Nustep L4x10' PT present to monitor      Knee/Hip Exercises: Standing   Walking with Sports Cord 15lb 5 reps bwd, 10lb sidestepping x 5 reps, close supervision and min A for safety      Shoulder Exercises: Power [employer] 10 reps    Row Limitations 20lb, standing                 Balance Exercises - 08/15/21 0001       Balance Exercises: Standing   Stepping Strategy Anterior;Lateral;5 reps   to color discs on floor with step/lunge and come back   Tandem Gait Forward;Upper extremity support;1 rep   along wall x 15'   Step Over Hurdles / [place] hurdle step overs 3 rounds each: step to leading Lt, step to leading Rt, step through with pause for control, gait belt, single LOB with first rep of each, then improved with repeated practice    Other Standing Exercises march to cone tap x 10 Lt/Rt                  PT Short Term Goals - 04/05/21 0926       PT SHORT TERM GOAL #1   Title Patient will complete 3 minute walk test using LRAD with only supervision to indicate decreased fall risk.    Status Achieved      PT SHORT  TERM GOAL #2   Title Pt will verbalize and demo safe strategies for stair pattern (step to for descending) and gait patterns/pace (slow down) without AD    Status Achieved      PT SHORT TERM GOAL #3   Title PT and Pt will explore gait with walker and make decisions about appropriateness of use  for improved safety.    Baseline using walking sticks, has had 1 fall since starting PT    Status Achieved      PT SHORT TERM GOAL #4   Title Pt ind with initial HEP    Status Achieved               PT Long Term Goals - 08/08/21 1242       PT LONG TERM GOAL #1   Title Improve 5x sit to stand to </= 14 sec.    Status Achieved      PT LONG TERM GOAL #2   Title Pt will demo up/down stairs x 3 rounds (4 6 inch steps) using bil UEs and safe strategy to demo improved safety on stairs    Baseline step to pattern, no handrails, 1 LOB having to put hand on the wall    Status Achieved      PT LONG TERM GOAL #3   Title Pt will demo safety/no LOB with community ambulation for 6' walk test, curb up/down and grassy surface with supervision using least restrictive AD or no AD.    Baseline practiced 6' walk outdoors today with hill, curb up/down x 5, grassy surface    Status On-going      PT LONG TERM GOAL #4   Title Pt will report at least 50% more confidence in balance with daily tasks    Status Achieved      PT LONG TERM GOAL #5   Title Ind with advanced HEP and understand how to safely progress    Status On-going      PT LONG TERM GOAL #6   Title Improved DGI score to at least 22/24 to reduce fall risk with dynamic gait challenges    Status Achieved      PT LONG TERM GOAL #7   Title Pt able to successfully perform step through pattern over 4 hurdles without LOB at least 2 trials of 3.    Status On-going                   Plan - 08/15/21 1323     Clinical Impression Statement Pt had increased difficulty with coordination of Rt/Lt alternating tasks today such as lateral  stepping to discs on floor alt sides.  He did show improved control with hurdle step overs with alternating pattern today compared to last visit.  His motor planning was slower today and caused multiple episodes of LOB.  Gait belt and min/mod A for safety used with challenges today.  Pt performed resistance walking with pulley belt today which was new and especially challenging with lateral stepping.  Pt has a high fall risk with history of falls and fractures, complex medical history including ongoing treatment for skin cancer, and risk of regression without PT.    Comorbidities skin cancer/ongoing radiation and possible upcoming chemo treatment, seizures, memory deficits, osteoporosis with Hx of multiple fractures    PT Frequency 1x / week    PT Duration 8 weeks    PT Next Visit Plan continue stability on single leg for hurdle step through stepovers, march cone taps, cognitive and coordination challenges, gait with head turns, stagger stance with eyes closed    PT Home Exercise Plan [id number]    Consulted and Agree with Plan of Care Patient             Patient will benefit from skilled therapeutic intervention in order to improve the following deficits and impairments:  Visit Diagnosis: Other abnormalities of gait and mobility  Muscle weakness (generalized)  Repeated falls  Unspecified lack of coordination     Problem List Patient Active Problem List   Diagnosis Date Noted   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 05/11/2021   Liver cirrhosis secondary to NASH (HCC)    GERD (gastroesophageal reflux disease)    Pancytopenia (HCC)    Osteomyelitis of jaw 12/29/2020   Acute osteomyelitis of mandible 12/26/2020   S/P inguinal hernia repair 01/14/2020   At high risk for falls    S/P shoulder surgery 03/05/2018   Excessive daytime sleepiness 02/18/2018   Crohn's disease of colon with rectal bleeding ([place]) 02/18/2018   Blood in stool 02/18/2018   Iron deficiency anemia  due to chronic blood loss 02/18/2018   Iron deficiency anemia due to sideropenic dysphagia 02/18/2018   Renal calculus, left 02/02/2018   History of sepsis 12/16/2017   Renal cyst, acquired, left 07/08/2017   Frequent falls 07/08/2017   Renal lesion 05/28/2017   MSSA bacteremia 05/06/2017   Confusion 04/02/2017   Syncope 08/30/2016   Depression 04/30/2016   Memory loss 04/30/2016   OSA on CPAP 10/31/2015   Nonepileptic episode ([place]) 04/25/2015   Seizure ([place]) 04/04/2013   Abnormal EKG 03/12/2013   DOE (dyspnea on exertion) 03/12/2013   Complex partial seizure ([place]) 04/19/2012   DM2 (diabetes mellitus, type 2) ([place]) 09/25/2011   OTHER DYSPHAGIA 03/27/2009   Cough 11/10/2008   GERD 12/07/2007   COLONIC POLYPS, ADENOMATOUS, HX OF 12/07/2007   EXTERNAL HEMORRHOIDS 09/11/2007   CROHN'S DISEASE, LARGE AND SMALL INTESTINES 09/11/2007   OSTEOPOROSIS 09/11/2007   HYPERLIPIDEMIA NEC/NOS 02/27/2007   Essential hypertension 02/27/2007   Baruch Merl, PT 08/15/21 1:26 PM   [place] @ [address] [place], [place], [address] Phone: [phone]   Fax:  [phone]  Name: [name] [name] MRN: [id number] Date of Birth: 02-02-1961

## 2021-08-20 DIAGNOSIS — N3941 Urge incontinence: Secondary | ICD-10-CM | POA: Diagnosis not present

## 2021-08-20 DIAGNOSIS — N200 Calculus of kidney: Secondary | ICD-10-CM | POA: Diagnosis not present

## 2021-08-21 DIAGNOSIS — E119 Type 2 diabetes mellitus without complications: Secondary | ICD-10-CM | POA: Diagnosis not present

## 2021-08-21 DIAGNOSIS — G893 Neoplasm related pain (acute) (chronic): Secondary | ICD-10-CM | POA: Diagnosis not present

## 2021-08-21 DIAGNOSIS — C44329 Squamous cell carcinoma of skin of other parts of face: Secondary | ICD-10-CM | POA: Diagnosis not present

## 2021-08-21 DIAGNOSIS — L509 Urticaria, unspecified: Secondary | ICD-10-CM | POA: Diagnosis not present

## 2021-08-21 DIAGNOSIS — Z79899 Other long term (current) drug therapy: Secondary | ICD-10-CM | POA: Diagnosis not present

## 2021-08-21 DIAGNOSIS — K5090 Crohn's disease, unspecified, without complications: Secondary | ICD-10-CM | POA: Diagnosis not present

## 2021-08-21 DIAGNOSIS — C4492 Squamous cell carcinoma of skin, unspecified: Secondary | ICD-10-CM | POA: Diagnosis not present

## 2021-08-21 DIAGNOSIS — G40209 Localization-related (focal) (partial) symptomatic epilepsy and epileptic syndromes with complex partial seizures, not intractable, without status epilepticus: Secondary | ICD-10-CM | POA: Diagnosis not present

## 2021-08-21 DIAGNOSIS — Z5112 Encounter for antineoplastic immunotherapy: Secondary | ICD-10-CM | POA: Diagnosis not present

## 2021-08-21 DIAGNOSIS — Z882 Allergy status to sulfonamides status: Secondary | ICD-10-CM | POA: Diagnosis not present

## 2021-08-22 ENCOUNTER — Ambulatory Visit: Payer: Medicare Other | Attending: Family Medicine | Admitting: Physical Therapy

## 2021-08-22 ENCOUNTER — Encounter: Payer: Self-pay | Admitting: Physical Therapy

## 2021-08-22 ENCOUNTER — Other Ambulatory Visit: Payer: Self-pay

## 2021-08-22 DIAGNOSIS — R2689 Other abnormalities of gait and mobility: Secondary | ICD-10-CM | POA: Diagnosis not present

## 2021-08-22 DIAGNOSIS — R296 Repeated falls: Secondary | ICD-10-CM | POA: Diagnosis not present

## 2021-08-22 DIAGNOSIS — M6281 Muscle weakness (generalized): Secondary | ICD-10-CM | POA: Diagnosis not present

## 2021-08-22 DIAGNOSIS — R279 Unspecified lack of coordination: Secondary | ICD-10-CM | POA: Insufficient documentation

## 2021-08-22 NOTE — Therapy (Signed)
[name], [place], [address] Phone: [phone]   Fax:  [phone]  Physical Therapy Treatment  Patient Details  Name: [name] [name] MRN: [id number] Date of Birth: 18-Oct-1960 Referring Provider (PT): [name], MD   Encounter Date: 08/22/2021   PT End of Session - 08/22/21 0939     Visit Number 32    Date for PT Re-Evaluation 09/05/21    Authorization Type UHC Medicare    Authorization - Visit Number 36    Authorization - Number of Visits 60    Progress Note Due on Visit 3    PT Start Time 0932    PT Stop Time 1015    PT Time Calculation (min) 43 min    Equipment Utilized During Treatment Gait belt    Activity Tolerance Patient tolerated treatment well    Behavior During Therapy [facility] for tasks assessed/performed             Past Medical History:  Diagnosis Date   Adenomatous colon polyp 10/1999   Anxiety    Arthritis    neck, yoga helps.   Cataract    Complex partial seizure ([place])    last seizure was 08-30-2018   Crohn's disease of small and large intestines ([place]) 1999   Depression    Diabetes mellitus    no meds at this time 10-24-17   Esophageal stricture    External hemorrhoids    GERD (gastroesophageal reflux disease)    Glaucoma    History of kidney stones    3 large stones still present   Hyperlipemia    Hypertension    resolved with weight loss    Inguinal hernia, bilateral 12/2019   Liver cirrhosis secondary to NASH ([place])    Migraines    Osteoporosis    PONV (postoperative nausea and vomiting)    PTSD (post-traumatic stress disorder)    Renal cyst, acquired, left 07/08/2017   Skin cancer    squamous cell multiple; [place]; followed every 3 months.   Sleep apnea    CPAP machine, uses nightly   Staphylococcus aureus bacteremia with sepsis ([place]) 05/28/2017   MRSA 2012    Past Surgical History:  Procedure Laterality Date   CATARACT EXTRACTION Bilateral     COLONOSCOPY     DEBRIDEMENT MANDIBLE N/A 12/07/2020   Procedure: DEBRIDEMENT OF MANDIBLE;  Surgeon: [name], DMD;  Location: [place];  Service: Oral Surgery;  Laterality: N/A;   ELBOW SURGERY  2012   elbow MRSA infection    EYE SURGERY     cataracts removed, /w IOL   GROSS TEETH DEBRIDEMENT N/A 12/29/2020   Procedure: MANDIBULAR  DEBRIDEMENT;  Surgeon: [name], DMD;  Location: [place];  Service: Dentistry;  Laterality: N/A;   INCISION AND DRAINAGE ABSCESS N/A 12/07/2020   Procedure: INCISION AND DRAINAGE MANDIBULAR ABSCESS;  Surgeon: [name], DMD;  Location: [place];  Service: Oral Surgery;  Laterality: N/A;   INGUINAL HERNIA REPAIR Right 01/14/2020   Procedure: OPEN RIGHT HERNIA REPAIR INGUINAL WITH MESH;  Surgeon: [name], MD;  [facility];  Service: General;  Laterality: Right;   INSERTION OF MESH N/A 07/01/2016   Procedure: INSERTION OF MESH;  Surgeon: [name], MD;  Location: [place];  Service: General;  Laterality: N/A;   IR URETERAL STENT LEFT NEW ACCESS W/O SEP NEPHROSTOMY CATH  02/02/2018   NEPHROLITHOTOMY Left 02/02/2018   Procedure: NEPHROLITHOTOMY PERCUTANEOUS;  Surgeon:  [name], MD;  [facility];  Service: Urology;  Laterality: Left;   POLYPECTOMY     SCALP LACERATION REPAIR Right 10/16/2017   From fall/staples   SHOULDER ARTHROSCOPY Right 03/05/2018   Procedure: ARTHROSCOPY SHOULDER AND OPEN DISTAL CLAVICLE EXCISION;  Surgeon: [name], MD;  Location: [place];  Service: Orthopedics;  Laterality: Right;   SHOULDER SURGERY     SINUS SURGERY WITH INSTATRAK     TEE WITHOUT CARDIOVERSION N/A 05/09/2017   Procedure: TRANSESOPHAGEAL ECHOCARDIOGRAM (TEE);  Surgeon: [name], MD;  Location: [facility] ENDOSCOPY;  Service: Cardiovascular;  Laterality: N/A;   TOOTH EXTRACTION N/A 12/07/2020   Procedure: DENTAL EXTRACTIONS OF TEETH TWENTY TO THIRTY;  Surgeon: [name], DMD;  Location: [facility];  Service: Oral Surgery;  Laterality:  N/A;   TOOTH EXTRACTION N/A 12/29/2020   Procedure: DENTAL RESTORATION/EXTRACTIONS;  Surgeon: [name], DMD;  Location: [place];  Service: Dentistry;  Laterality: N/A;   TRANSTHORACIC ECHOCARDIOGRAM  02/22/2011   EF 55-65%; increased pattern of LVH with mild conc hypertrophy, abnormal relaxation & increased filling pressure (grade 2 diastolic dysfunction); atrial septum thickened (lipomatous hypertrophy)   UMBILICAL HERNIA REPAIR N/A 07/01/2016   Procedure: UMBILICAL HERNIA REPAIR WITH MESH;  Surgeon: [name], MD;  Location: [place];  Service: General;  Laterality: N/A;   UPPER GASTROINTESTINAL ENDOSCOPY     VASECTOMY      There were no vitals filed for this visit.   Subjective Assessment - 08/22/21 0940     Subjective I have been having a lot of balance issues.  I keep getting the warnings that I'm going to pass out and can usually change what I'm doing to control it but sometimes I pass out.  This has been happening for years but maybe happening more often now.    Pertinent History on disability, has memory problems, liver cirrhosis, [place], osteoporosis with multiple fractures including C6, thrombocytopenia, gluacoma (loss of peripheral vision).  Has lost 75lb through walking but concerned about safety    Limitations Walking    How long can you walk comfortably? 5 miles with walking stick    Diagnostic tests C6 fracture and ligamentum flavum tear - non-surgical    Patient Stated Goals work on coming down and up stairs, balance    Currently in Pain? No/denies                               [facility] Adult PT Treatment/Exercise - 08/22/21 0001       Neuro Re-ed    Neuro Re-ed Details  ongoing cognitive coordination challenges to switch exercise from Rt to Lt, coordinate opp arm raise with march taps to stair, VC/TC for coordination of supine dead bug      Exercises   Exercises Knee/Hip;Lumbar;Shoulder      Lumbar Exercises: Stretches   Piriformis Stretch 1  rep;30 seconds    Piriformis Stretch Limitations edge of mat table      Lumbar Exercises: Aerobic   Nustep L4x10' PT present to monitor      Lumbar Exercises: Supine   Dead Bug 20 reps    Dead Bug Limitations challenge with coordination of UE/LE, needed frequent cueing      Knee/Hip Exercises: Seated   Sit to Sand 2 sets;10 reps;without UE support;Other (comment)   hold 10lb, mat table + 10lb then 20lb 2nd set     Knee/Hip Exercises: Supine   Bridges Strengthening;1  set;20 reps    Straight Leg Raises AROM;1 set;15 reps;Both                 Balance Exercises - 08/22/21 0001       Balance Exercises: Standing   Gait with Head Turns 5 reps    Retro Gait 5 reps    Marching Foam/compliant surface;20 reps;Upper extremity assist 1;Other (comment)   tap to 2nd step, add opp arm raise for coordination challenge   Other Standing Exercises beach ball toss against wall and catch, straight and Lt/Rt diagonals x 1'                  PT Short Term Goals - 04/05/21 6433       PT SHORT TERM GOAL #1   Title Patient will complete 3 minute walk test using LRAD with only supervision to indicate decreased fall risk.    Status Achieved      PT SHORT TERM GOAL #2   Title Pt will verbalize and demo safe strategies for stair pattern (step to for descending) and gait patterns/pace (slow down) without AD    Status Achieved      PT SHORT TERM GOAL #3   Title PT and Pt will explore gait with walker and make decisions about appropriateness of use for improved safety.    Baseline using walking sticks, has had 1 fall since starting PT    Status Achieved      PT SHORT TERM GOAL #4   Title Pt ind with initial HEP    Status Achieved               PT Long Term Goals - 08/08/21 1242       PT LONG TERM GOAL #1   Title Improve 5x sit to stand to </= 14 sec.    Status Achieved      PT LONG TERM GOAL #2   Title Pt will demo up/down stairs x 3 rounds (4 6 inch steps) using bil UEs  and safe strategy to demo improved safety on stairs    Baseline step to pattern, no handrails, 1 LOB having to put hand on the wall    Status Achieved      PT LONG TERM GOAL #3   Title Pt will demo safety/no LOB with community ambulation for 6' walk test, curb up/down and grassy surface with supervision using least restrictive AD or no AD.    Baseline practiced 6' walk outdoors today with hill, curb up/down x 5, grassy surface    Status On-going      PT LONG TERM GOAL #4   Title Pt will report at least 50% more confidence in balance with daily tasks    Status Achieved      PT LONG TERM GOAL #5   Title Ind with advanced HEP and understand how to safely progress    Status On-going      PT LONG TERM GOAL #6   Title Improved DGI score to at least 22/24 to reduce fall risk with dynamic gait challenges    Status Achieved      PT LONG TERM GOAL #7   Title Pt able to successfully perform step through pattern over 4 hurdles without LOB at least 2 trials of 3.    Status On-going                   Plan - 08/22/21 0942     Clinical Impression Statement Pt  reports having increased balance troubles related to neurological symtpoms which have been worsening with ongoing treatments for skin cancer.  He is able to adjust activity most of the time to prevent passing out but does sometimes pass out for a couple of minutes, per Pt report.  He also reported a fall in the bathroom when he missed the toilet and needed his wife to help him up.  He continues to be high fall risk.  He has difficulty withopposite UE/LE coordination tasks, memory challenges when asked to switch between Rt/Lt tasks, and dual task challenges.  Pt continues to actively participate within sessions with good work rate but does require close supervision to mod A for safety depending on balance/task challenge.  PT added opp arm raise to march taps to 2nd step today from airex pad and supine dead bugs which required frequent  coordination cueing by PT.  Continue along POC with upcoming ERO in 2 weeks.  Pt may be having significant skin surgery related to cancer at the end of the month so discussed with Pt's wife we may take a break from PT and resume with new PT Rx after he is cleared/healed from surgery if this in fact gets scheduled.    Personal Factors and Comorbidities Comorbidity 1;Comorbidity 2;Comorbidity 3+;Behavior Pattern;Time since onset of injury/illness/exacerbation    Comorbidities skin cancer/ongoing radiation and possible upcoming chemo treatment, seizures, memory deficits, osteoporosis with Hx of multiple fractures    Rehab Potential Good    PT Frequency 1x / week    PT Duration 8 weeks    PT Treatment/Interventions ADLs/Self Care Home Management;Gait training;Stair training;Functional mobility training;Therapeutic activities;Therapeutic exercise;Balance training;Neuromuscular re-education;Manual techniques;Patient/family education;Passive range of motion    PT Next Visit Plan opp arm/leg coordination, bridges, sit to stand 20lb, cognitive coordination challenge to switch exercises b/w Rt/Lt, retro gait, gait with head turns    PT [place] and Agree with Plan of Care Patient;Family member/caregiver    Family Member Consulted Pt's wife             Patient will benefit from skilled therapeutic intervention in order to improve the following deficits and impairments:     Visit Diagnosis: Other abnormalities of gait and mobility  Muscle weakness (generalized)  Repeated falls  Unspecified lack of coordination  Lack of coordination     Problem List Patient Active Problem List   Diagnosis Date Noted   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 05/11/2021   Liver cirrhosis secondary to NASH (HCC)    GERD (gastroesophageal reflux disease)    Pancytopenia (HCC)    Osteomyelitis of jaw 12/29/2020   Acute osteomyelitis of mandible 12/26/2020   S/P inguinal  hernia repair 01/14/2020   At high risk for falls    S/P shoulder surgery 03/05/2018   Excessive daytime sleepiness 02/18/2018   Crohn's disease of colon with rectal bleeding ([place]) 02/18/2018   Blood in stool 02/18/2018   Iron deficiency anemia due to chronic blood loss 02/18/2018   Iron deficiency anemia due to sideropenic dysphagia 02/18/2018   Renal calculus, left 02/02/2018   History of sepsis 12/16/2017   Renal cyst, acquired, left 07/08/2017   Frequent falls 07/08/2017   Renal lesion 05/28/2017   MSSA bacteremia 05/06/2017   Confusion 04/02/2017   Syncope 08/30/2016   Depression 04/30/2016   Memory loss 04/30/2016   OSA on CPAP 10/31/2015   Nonepileptic episode ([place]) 04/25/2015   Seizure ([place]) 04/04/2013   Abnormal EKG  03/12/2013   DOE (dyspnea on exertion) 03/12/2013   Complex partial seizure ([place]) 04/19/2012   DM2 (diabetes mellitus, type 2) ([place]) 09/25/2011   OTHER DYSPHAGIA 03/27/2009   Cough 11/10/2008   GERD 12/07/2007   COLONIC POLYPS, ADENOMATOUS, HX OF 12/07/2007   EXTERNAL HEMORRHOIDS 09/11/2007   CROHN'S DISEASE, LARGE AND SMALL INTESTINES 09/11/2007   OSTEOPOROSIS 09/11/2007   HYPERLIPIDEMIA NEC/NOS 02/27/2007   Essential hypertension 02/27/2007    Baruch Merl, PT 08/22/21 10:31 AM   [facility] @ [address] [place], [place], [address] Phone: [phone]   Fax:  [phone]  Name: KHAIDYN STAEBELL MRN: [id number] Date of Birth: 1960/09/13

## 2021-08-27 NOTE — Progress Notes (Shared)
[facility] Note  09/04/2021     CHIEF COMPLAINT Patient presents for No chief complaint on file.   HISTORY OF PRESENT ILLNESS: [name] [name] is a 61 y.o. male who presents to the clinic today for:    Pt had a CT of chest and neck last week, as well as oral surgery consultation, he has an appt with an oncologist this afternoon to see about trying immunotherapy drugs  Referring physician: [name], MD [address],  [place] [address]  HISTORICAL INFORMATION:   Selected notes from the MEDICAL RECORD NUMBER Referred by Dr Lucianne Lei for concern of shallow RD OD   CURRENT MEDICATIONS: Current Outpatient Medications (Ophthalmic Drugs)  Medication Sig   cycloSPORINE (RESTASIS) 0.05 % ophthalmic emulsion Place 1 drop into both eyes 2 (two) times daily.   latanoprost (XALATAN) 0.005 % ophthalmic solution Place 1 drop into both eyes at bedtime.    No current facility-administered medications for this visit. (Ophthalmic Drugs)   Current Outpatient Medications (Other)  Medication Sig   Calcium Carbonate-Vitamin D (CALCIUM 600+D PO) Take 1 tablet by mouth 2 (two) times daily.   chlorhexidine (PERIDEX) 0.12 % solution Use as directed 15 mLs in the mouth or throat in the morning, at noon, and at bedtime.   cholecalciferol (VITAMIN D) 1000 units tablet Take 1,000 Units at bedtime by mouth.    esomeprazole (NEXIUM) 40 MG capsule Take 1 capsule (40 mg total) by mouth 2 (two) times daily before a meal.   Eszopiclone 3 MG TABS Take 3 mg by mouth at bedtime. Take immediately before bedtime   feeding supplement (ENSURE ENLIVE / ENSURE PLUS) LIQD Take 237 mLs by mouth 2 (two) times daily between meals.   folic acid (FOLVITE) 1 MG tablet Take 2 tablets (2 mg total) by mouth daily.   glucose blood (CHOICE DM FORA G20 TEST STRIPS) test strip Use as instructed   imiquimod (ALDARA) 5 % cream Apply 1 packet topically 2 (two) times a week. (Patient not  taking: Reported on 07/12/2021)   Lactase (DAIRY RELIEF PO) Take 2 tablets by mouth in the morning and at bedtime.   lamoTRIgine (LAMICTAL) 150 MG tablet Take 1 tablet (150 mg total) by mouth 2 (two) times daily.   mercaptopurine (PURINETHOL) 50 MG tablet TAKE 2 TABLETS DAILY ON AN               EMPTY STOMACH 1 HOUR BEFORE              OR 2 HOURS AFTER A MEAL.                 CAUTION: CHEMOTHERAPY. NEED APPOINTMENT. (Patient not taking: Reported on 08/03/2021)   mesalamine (PENTASA) 500 MG CR capsule Take 1,000 mg by mouth daily.   methscopolamine (PAMINE FORTE) 5 MG tablet Take 1 tablet (5 mg total) by mouth 2 (two) times daily.   Multiple Vitamin (MULTIVITAMIN WITH MINERALS) TABS tablet Take 1 tablet by mouth daily.   ONE TOUCH ULTRA TEST test strip USE AS INSTRUCTED   potassium chloride SA (KLOR-CON) 20 MEQ tablet Take 1 tablet (20 mEq total) by mouth 2 (two) times daily.   Probiotic Product (PROBIOTIC PO) Take 1 tablet at bedtime by mouth.    rifaximin (XIFAXAN) 550 MG TABS tablet Take 1 tablet (550 mg total) by mouth 2 (two) times daily.   sertraline (ZOLOFT) 100 MG tablet Take 200 mg daily with breakfast by  mouth.    tolterodine (DETROL LA) 4 MG 24 hr capsule Take 4 mg by mouth daily.   traZODone (DESYREL) 150 MG tablet Take 150 mg by mouth at bedtime.   No current facility-administered medications for this visit. (Other)   Facility-Administered Medications Ordered in Other Visits (Other)  Medication Route   gadopentetate dimeglumine (MAGNEVIST) injection 20 mL Intravenous   REVIEW OF SYSTEMS:   ALLERGIES Allergies  Allergen Reactions   Azithromycin Swelling   Beef-Derived Products Diarrhea    RED MEAT > UNSPECIFIED REACTION    Milk-Related Compounds Diarrhea    DAIRY >UNSPECIFIED REACTION    Penicillins Hives    Rash as child- tolerates Ancef/CHILDHOOD ALLERGY Has patient had a PCN reaction causing immediate rash, facial/tongue/throat swelling, SOB or lightheadedness with  hypotension: YES Has patient had a PCN reaction causing severe rash involving mucus membranes or skin necrosis: Unknown Has patient had a PCN reaction that required hospitalization: Unknown Has patient had a PCN reaction occurring within the last 10 years:Unknown If all of the above answers are "NO", then may proceed with Cephalosporin [place]   Sulfonamide Derivatives Hives   Dilaudid [Hydromorphone Hcl] Nausea And Vomiting    PAST MEDICAL HISTORY Past Medical History:  Diagnosis Date   Adenomatous colon polyp 10/1999   Anxiety    Arthritis    neck, yoga helps.   Cataract    Complex partial seizure ([place])    last seizure was 08-30-2018   Crohn's disease of small and large intestines ([place]) 1999   Depression    Diabetes mellitus    no meds at this time 10-24-17   Esophageal stricture    External hemorrhoids    GERD (gastroesophageal reflux disease)    Glaucoma    History of kidney stones    3 large stones still present   Hyperlipemia    Hypertension    resolved with weight loss    Inguinal hernia, bilateral 12/2019   Liver cirrhosis secondary to NASH ([place])    Migraines    Osteoporosis    PONV (postoperative nausea and vomiting)    PTSD (post-traumatic stress disorder)    Renal cyst, acquired, left 07/08/2017   Skin cancer    squamous cell multiple; [facility]; followed every 3 months.   Sleep apnea    CPAP machine, uses nightly   Staphylococcus aureus bacteremia with sepsis ([place]) 05/28/2017   MRSA 2012   Past Surgical History:  Procedure Laterality Date   CATARACT EXTRACTION Bilateral    COLONOSCOPY     DEBRIDEMENT MANDIBLE N/A 12/07/2020   Procedure: DEBRIDEMENT OF MANDIBLE;  Surgeon: [name], DMD;  Location: [facility];  Service: Oral Surgery;  Laterality: N/A;   ELBOW SURGERY  2012   elbow MRSA infection    EYE SURGERY     cataracts removed, /w IOL   GROSS TEETH DEBRIDEMENT N/A 12/29/2020   Procedure: MANDIBULAR  DEBRIDEMENT;  Surgeon: [name], DMD;   Location: [facility];  Service: Dentistry;  Laterality: N/A;   INCISION AND DRAINAGE ABSCESS N/A 12/07/2020   Procedure: INCISION AND DRAINAGE MANDIBULAR ABSCESS;  Surgeon: [name], DMD;  Location: [facility];  Service: Oral Surgery;  Laterality: N/A;   INGUINAL HERNIA REPAIR Right 01/14/2020   Procedure: OPEN RIGHT HERNIA REPAIR INGUINAL WITH MESH;  Surgeon: [name], MD;  Location: [facility];  Service: General;  Laterality: Right;   INSERTION OF MESH N/A 07/01/2016   Procedure: INSERTION OF MESH;  Surgeon: [name], MD;  Location:  [facility];  Service: General;  Laterality: N/A;   IR URETERAL STENT LEFT NEW ACCESS W/O SEP NEPHROSTOMY CATH  02/02/2018   NEPHROLITHOTOMY Left 02/02/2018   Procedure: NEPHROLITHOTOMY PERCUTANEOUS;  Surgeon: [name], MD;  Location: [facility];  Service: Urology;  Laterality: Left;   POLYPECTOMY     SCALP LACERATION REPAIR Right 10/16/2017   From fall/staples   SHOULDER ARTHROSCOPY Right 03/05/2018   Procedure: ARTHROSCOPY SHOULDER AND OPEN DISTAL CLAVICLE EXCISION;  Surgeon: [name], MD;  Location: [facility];  Service: Orthopedics;  Laterality: Right;   SHOULDER SURGERY     SINUS SURGERY WITH INSTATRAK     TEE WITHOUT CARDIOVERSION N/A 05/09/2017   Procedure: TRANSESOPHAGEAL ECHOCARDIOGRAM (TEE);  Surgeon: [name], MD;  [facility];  Service: Cardiovascular;  Laterality: N/A;   TOOTH EXTRACTION N/A 12/07/2020   Procedure: DENTAL EXTRACTIONS OF TEETH TWENTY TO THIRTY;  Surgeon: [name], DMD;  Location: [facility];  Service: Oral Surgery;  Laterality: N/A;   TOOTH EXTRACTION N/A 12/29/2020   Procedure: DENTAL RESTORATION/EXTRACTIONS;  Surgeon: [name], DMD;  Location: [facility];  Service: Dentistry;  Laterality: N/A;   TRANSTHORACIC ECHOCARDIOGRAM  02/22/2011   EF 55-65%; increased pattern of LVH with mild conc hypertrophy, abnormal relaxation & increased filling pressure (grade 2 diastolic dysfunction); atrial septum thickened  (lipomatous hypertrophy)   UMBILICAL HERNIA REPAIR N/A 07/01/2016   Procedure: UMBILICAL HERNIA REPAIR WITH MESH;  Surgeon: [name], MD;  Location: [facility];  Service: General;  Laterality: N/A;   UPPER GASTROINTESTINAL ENDOSCOPY     VASECTOMY      FAMILY HISTORY Family History  Problem Relation Age of Onset   Heart disease Mother        CABG at age 17   Hyperlipidemia Mother    Hypertension Mother    Diabetes Father    Colon polyps Father    Stroke Father    Hyperlipidemia Father    Hypertension Father    Stroke Paternal Grandmother    Stroke Paternal Grandfather    Other Child        tetrology of fallot (cornealia deland syndrome)   Colon cancer Neg Hx    Esophageal cancer Neg Hx    Stomach cancer Neg Hx    Rectal cancer Neg Hx     SOCIAL HISTORY Social History   Tobacco Use   Smoking status: Never    Passive exposure: Never   Smokeless tobacco: Never  Vaping Use   Vaping Use: Never used  Substance Use Topics   Alcohol use: Never   Drug use: Never       OPHTHALMIC EXAM:  Not recorded     IMAGING AND PROCEDURES  Imaging and Procedures for 09/04/2021          ASSESSMENT/PLAN:    ICD-10-CM   1. Right retinoschisis  H33.101     2. Right retinal detachment  H33.21     3. Post-radiation retinopathy, subsequent encounter  T66.XXXD    H35.89     4. Essential hypertension  I10     5. Hypertensive retinopathy of both eyes  H35.033     6. Pseudophakia, both eyes  Z96.1     7. Low-tension glaucoma of both eyes, mild stage  H40.1231        1-3. 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 Hypertensive retinopathy OU - discussed importance of tight BP control - monitor    6. Pseudophakia OD  - s/p CE/IOL  - IOL in good position, doing well  - monitor   7. Low tension glaucoma  - managed by Dr. Lucianne Lei  - IOP: 06,05  - on Latanoprost qhs OU   Ophthalmic Meds Ordered this visit:  No orders of the defined types were placed in this encounter.    No follow-ups on file.  There are no Patient Instructions on file for this visit.  Explained the diagnoses, plan, and follow up with the patient and they expressed understanding.  Patient expressed understanding of the importance of proper follow up care.   This document serves as a record of services personally performed by Gardiner Sleeper, MD, PhD. It was created on their behalf by Leonie Douglas, an ophthalmic technician. The creation of this record is the provider's dictation and/or activities during the visit.    Electronically signed by: Leonie Douglas COA, 08/27/21  2:58 PM   Gardiner Sleeper, M.D., Ph.D. Diseases & Surgery of the Retina and Vitreous [facility]: M myopia (nearsighted); A astigmatism; H hyperopia (farsighted); P presbyopia; Mrx spectacle prescription;  CTL contact lenses; OD right eye; OS left eye; OU both eyes  XT exotropia; ET esotropia; PEK punctate epithelial keratitis; PEE punctate epithelial erosions; DES dry eye syndrome; MGD meibomian gland dysfunction; ATs artificial tears; PFAT's preservative free artificial tears; [place] nuclear sclerotic cataract; PSC posterior subcapsular cataract; ERM epi-retinal membrane; PVD posterior vitreous detachment; RD retinal detachment; DM diabetes mellitus; DR diabetic retinopathy; NPDR non-proliferative diabetic retinopathy;  PDR proliferative diabetic retinopathy; CSME clinically significant macular edema; DME diabetic macular edema; dbh dot blot hemorrhages; CWS cotton wool spot; POAG primary open angle glaucoma; C/D cup-to-disc ratio; HVF humphrey visual field; GVF goldmann visual field; OCT optical coherence tomography; IOP intraocular pressure; BRVO Branch retinal vein occlusion; CRVO central retinal vein occlusion; CRAO central retinal artery occlusion; BRAO branch retinal artery occlusion; RT retinal tear; SB scleral buckle; PPV pars plana vitrectomy; VH Vitreous hemorrhage; PRP panretinal laser photocoagulation; IVK intravitreal kenalog; VMT vitreomacular traction; MH Macular hole;  NVD neovascularization of the disc; NVE neovascularization elsewhere; AREDS age related eye disease study; ARMD age related macular degeneration; POAG primary open angle glaucoma; EBMD epithelial/anterior basement membrane dystrophy; ACIOL anterior chamber intraocular lens; IOL intraocular lens; PCIOL posterior chamber intraocular lens; Phaco/IOL phacoemulsification with intraocular lens placement; Sun photorefractive keratectomy; LASIK laser assisted in situ keratomileusis; HTN hypertension; DM diabetes mellitus; COPD chronic obstructive pulmonary disease

## 2021-08-29 ENCOUNTER — Other Ambulatory Visit: Payer: Self-pay

## 2021-08-29 ENCOUNTER — Ambulatory Visit: Payer: Medicare Other | Admitting: Physical Therapy

## 2021-08-29 ENCOUNTER — Encounter: Payer: Self-pay | Admitting: Physical Therapy

## 2021-08-29 DIAGNOSIS — R296 Repeated falls: Secondary | ICD-10-CM | POA: Diagnosis not present

## 2021-08-29 DIAGNOSIS — R2689 Other abnormalities of gait and mobility: Secondary | ICD-10-CM | POA: Diagnosis not present

## 2021-08-29 DIAGNOSIS — M6281 Muscle weakness (generalized): Secondary | ICD-10-CM | POA: Diagnosis not present

## 2021-08-29 DIAGNOSIS — R279 Unspecified lack of coordination: Secondary | ICD-10-CM | POA: Diagnosis not present

## 2021-08-29 NOTE — Therapy (Signed)
[facility] @ [address] [place], [place], [address] Phone: [phone]   Fax:  [phone]  Physical Therapy Treatment  Patient Details  Name: [name] [name] MRN: [id number] Date of Birth: 1960-10-01 Referring Provider (PT): [name], MD   Encounter Date: 08/29/2021   PT End of Session - 08/29/21 0936     Visit Number 33    Date for PT Re-Evaluation 09/05/21    Authorization Type UHC Medicare    Authorization - Visit Number 53    Authorization - Number of Visits 60    Progress Note Due on Visit 32    PT Start Time 0935    PT Stop Time 1014    PT Time Calculation (min) 39 min    Equipment Utilized During Treatment Gait belt    Activity Tolerance Patient tolerated treatment well    Behavior During Therapy [facility] for tasks assessed/performed             Past Medical History:  Diagnosis Date   Adenomatous colon polyp 10/1999   Anxiety    Arthritis    neck, yoga helps.   Cataract    Complex partial seizure ([place])    last seizure was 08-30-2018   Crohn's disease of small and large intestines ([place]) 1999   Depression    Diabetes mellitus    no meds at this time 10-24-17   Esophageal stricture    External hemorrhoids    GERD (gastroesophageal reflux disease)    Glaucoma    History of kidney stones    3 large stones still present   Hyperlipemia    Hypertension    resolved with weight loss    Inguinal hernia, bilateral 12/2019   Liver cirrhosis secondary to NASH ([place])    Migraines    Osteoporosis    PONV (postoperative nausea and vomiting)    PTSD (post-traumatic stress disorder)    Renal cyst, acquired, left 07/08/2017   Skin cancer    squamous cell multiple; [facility]; followed every 3 months.   Sleep apnea    CPAP machine, uses nightly   Staphylococcus aureus bacteremia with sepsis ([place]) 05/28/2017   MRSA 2012    Past Surgical History:  Procedure Laterality Date   CATARACT EXTRACTION Bilateral     COLONOSCOPY     DEBRIDEMENT MANDIBLE N/A 12/07/2020   Procedure: DEBRIDEMENT OF MANDIBLE;  Surgeon: [name], DMD;  Location: [place];  Service: Oral Surgery;  Laterality: N/A;   ELBOW SURGERY  2012   elbow MRSA infection    EYE SURGERY     cataracts removed, /w IOL   GROSS TEETH DEBRIDEMENT N/A 12/29/2020   Procedure: MANDIBULAR  DEBRIDEMENT;  Surgeon: [name], DMD;  Location: [place];  Service: Dentistry;  Laterality: N/A;   INCISION AND DRAINAGE ABSCESS N/A 12/07/2020   Procedure: INCISION AND DRAINAGE MANDIBULAR ABSCESS;  Surgeon: [name], DMD;  Location: [place];  Service: Oral Surgery;  Laterality: N/A;   INGUINAL HERNIA REPAIR Right 01/14/2020   Procedure: OPEN RIGHT HERNIA REPAIR INGUINAL WITH MESH;  Surgeon: [name], MD;  [facility];  Service: General;  Laterality: Right;   INSERTION OF MESH N/A 07/01/2016   Procedure: INSERTION OF MESH;  Surgeon: [name], MD;  Location: [place];  Service: General;  Laterality: N/A;   IR URETERAL STENT LEFT NEW ACCESS W/O SEP NEPHROSTOMY CATH  02/02/2018   NEPHROLITHOTOMY Left 02/02/2018   Procedure: NEPHROLITHOTOMY PERCUTANEOUS;  Surgeon:  [name], MD;  [facility];  Service: Urology;  Laterality: Left;   POLYPECTOMY     SCALP LACERATION REPAIR Right 10/16/2017   From fall/staples   SHOULDER ARTHROSCOPY Right 03/05/2018   Procedure: ARTHROSCOPY SHOULDER AND OPEN DISTAL CLAVICLE EXCISION;  Surgeon: [name], MD;  Location: [facility];  Service: Orthopedics;  Laterality: Right;   SHOULDER SURGERY     SINUS SURGERY WITH INSTATRAK     TEE WITHOUT CARDIOVERSION N/A 05/09/2017   Procedure: TRANSESOPHAGEAL ECHOCARDIOGRAM (TEE);  Surgeon: [name], MD;  Location: [facility];  Service: Cardiovascular;  Laterality: N/A;   TOOTH EXTRACTION N/A 12/07/2020   Procedure: DENTAL EXTRACTIONS OF TEETH TWENTY TO THIRTY;  Surgeon: [name], DMD;  Location: [place];  Service: Oral Surgery;  Laterality:  N/A;   TOOTH EXTRACTION N/A 12/29/2020   Procedure: DENTAL RESTORATION/EXTRACTIONS;  Surgeon: [name], DMD;  Location: [place];  Service: Dentistry;  Laterality: N/A;   TRANSTHORACIC ECHOCARDIOGRAM  02/22/2011   EF 55-65%; increased pattern of LVH with mild conc hypertrophy, abnormal relaxation & increased filling pressure (grade 2 diastolic dysfunction); atrial septum thickened (lipomatous hypertrophy)   UMBILICAL HERNIA REPAIR N/A 07/01/2016   Procedure: UMBILICAL HERNIA REPAIR WITH MESH;  Surgeon: [name], MD;  Location: [place];  Service: General;  Laterality: N/A;   UPPER GASTROINTESTINAL ENDOSCOPY     VASECTOMY      There were no vitals filed for this visit.   Subjective Assessment - 08/29/21 0937     Subjective I have had a couple of near falls and one time when walking I had to call my wife to come get me b/c I felt like I was going to pass out.  I move into the grass when that happens in case I fall.    Pertinent History on disability, has memory problems, liver cirrhosis, Chrons, osteoporosis with multiple fractures including C6, thrombocytopenia, gluacoma (loss of peripheral vision).  Has lost 75lb through walking but concerned about safety    Limitations Walking    How long can you walk comfortably? 5 miles with walking stick    Diagnostic tests C6 fracture and ligamentum flavum tear - non-surgical    Currently in Pain? No/denies                               [facility] Adult PT Treatment/Exercise - 08/29/21 0001       Knee/Hip Exercises: Standing   Heel Raises Both;20 reps      Knee/Hip Exercises: Seated   Sit to Sand 2 sets;10 reps;without UE support   20lb kbell                Balance Exercises - 08/29/21 0001       Balance Exercises: Standing   Sidestepping Theraband;5 reps   red loop   Step Over Hurdles / Cones forward step overs x 4 hurdles x 6 rounds with cognitive challenge of "name 3" from categories, lateral step over with  same arm and opp arm reach x 5 each    Lift / Chop Both;10 reps   weighted blue ball   Other Standing Exercises reactionary balance with gait and static stance: balloon batting to self with gait and against wall                  PT Short Term Goals - 04/05/21 0926       PT SHORT TERM GOAL #  1   Title Patient will complete 3 minute walk test using LRAD with only supervision to indicate decreased fall risk.    Status Achieved      PT SHORT TERM GOAL #2   Title Pt will verbalize and demo safe strategies for stair pattern (step to for descending) and gait patterns/pace (slow down) without AD    Status Achieved      PT SHORT TERM GOAL #3   Title PT and Pt will explore gait with walker and make decisions about appropriateness of use for improved safety.    Baseline using walking sticks, has had 1 fall since starting PT    Status Achieved      PT SHORT TERM GOAL #4   Title Pt ind with initial HEP    Status Achieved               PT Long Term Goals - 08/08/21 1242       PT LONG TERM GOAL #1   Title Improve 5x sit to stand to </= 14 sec.    Status Achieved      PT LONG TERM GOAL #2   Title Pt will demo up/down stairs x 3 rounds (4 6 inch steps) using bil UEs and safe strategy to demo improved safety on stairs    Baseline step to pattern, no handrails, 1 LOB having to put hand on the wall    Status Achieved      PT LONG TERM GOAL #3   Title Pt will demo safety/no LOB with community ambulation for 6' walk test, curb up/down and grassy surface with supervision using least restrictive AD or no AD.    Baseline practiced 6' walk outdoors today with hill, curb up/down x 5, grassy surface    Status On-going      PT LONG TERM GOAL #4   Title Pt will report at least 50% more confidence in balance with daily tasks    Status Achieved      PT LONG TERM GOAL #5   Title Ind with advanced HEP and understand how to safely progress    Status On-going      PT LONG TERM GOAL #6    Title Improved DGI score to at least 22/24 to reduce fall risk with dynamic gait challenges    Status Achieved      PT LONG TERM GOAL #7   Title Pt able to successfully perform step through pattern over 4 hurdles without LOB at least 2 trials of 3.    Status On-going                   Plan - 08/29/21 0941     Clinical Impression Statement Pt continues to be high fall risk.  He has balance and coordination challenges related to neurological defiicits which have been worsening with ongoing treatments for skin cancer.  He continues to be challenged by opposite UE/LE coordination tasks, dual tasks, and functional movements requiring head movements and time in SLS (hurdle step overs).  ERO next visit.  Pt being worked up for need for further skin cancer surgery and may influence whether Pt can continue or need to d/c.    Comorbidities skin cancer/ongoing radiation and possible upcoming chemo treatment, seizures, memory deficits, osteoporosis with Hx of multiple fractures    PT Frequency 1x / week    PT Duration 8 weeks    PT Treatment/Interventions ADLs/Self Care Home Management;Gait training;Stair training;Functional mobility training;Therapeutic activities;Therapeutic exercise;Balance training;Neuromuscular  re-education;Manual techniques;Patient/family education;Passive range of motion    PT Next Visit Plan opp arm/leg coordination, bridges, sit to stand 20lb, cognitive coordination challenge to switch exercises b/w Rt/Lt, retro gait, gait with head turns    PT [place] and Agree with Plan of Care Patient;Family member/caregiver    Family Member Consulted Pt's wife             Patient will benefit from skilled therapeutic intervention in order to improve the following deficits and impairments:     Visit Diagnosis: Other abnormalities of gait and mobility  Muscle weakness (generalized)  Repeated falls  Unspecified lack of coordination  Lack  of coordination     Problem List Patient Active Problem List   Diagnosis Date Noted   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 05/11/2021   Liver cirrhosis secondary to NASH (H[facility])    GERD (gastroesophageal reflux disease)    Pancytopenia ([facility])    Osteomyelitis of jaw 12/29/2020   Acute osteomyelitis of mandible 12/26/2020   S/P inguinal hernia repair 01/14/2020   At high risk for falls    S/P shoulder surgery 03/05/2018   Excessive daytime sleepiness 02/18/2018   Crohn's disease of colon with rectal bleeding ([place]) 02/18/2018   Blood in stool 02/18/2018   Iron deficiency anemia due to chronic blood loss 02/18/2018   Iron deficiency anemia due to sideropenic dysphagia 02/18/2018   Renal calculus, left 02/02/2018   History of sepsis 12/16/2017   Renal cyst, acquired, left 07/08/2017   Frequent falls 07/08/2017   Renal lesion 05/28/2017   MSSA bacteremia 05/06/2017   Confusion 04/02/2017   Syncope 08/30/2016   Depression 04/30/2016   Memory loss 04/30/2016   OSA on CPAP 10/31/2015   Nonepileptic episode ([place]) 04/25/2015   Seizure ([place]) 04/04/2013   Abnormal EKG 03/12/2013   DOE (dyspnea on exertion) 03/12/2013   Complex partial seizure ([place]) 04/19/2012   DM2 (diabetes mellitus, type 2) ([place]) 09/25/2011   OTHER DYSPHAGIA 03/27/2009   Cough 11/10/2008   GERD 12/07/2007   COLONIC POLYPS, ADENOMATOUS, HX OF 12/07/2007   EXTERNAL HEMORRHOIDS 09/11/2007   CROHN'S DISEASE, LARGE AND SMALL INTESTINES 09/11/2007   OSTEOPOROSIS 09/11/2007   HYPERLIPIDEMIA NEC/NOS 02/27/2007   Essential hypertension 02/27/2007    Baruch Merl, PT 08/29/21 10:14 AM   [facility] @ [address] [place], [place], [address] Phone: [phone]   Fax:  [phone]  Name: RIP HAWES MRN: [id number] Date of Birth: 1960/08/15

## 2021-09-04 ENCOUNTER — Other Ambulatory Visit: Payer: Self-pay

## 2021-09-04 ENCOUNTER — Encounter (INDEPENDENT_AMBULATORY_CARE_PROVIDER_SITE_OTHER): Payer: Self-pay | Admitting: Ophthalmology

## 2021-09-04 ENCOUNTER — Ambulatory Visit (INDEPENDENT_AMBULATORY_CARE_PROVIDER_SITE_OTHER): Payer: Medicare Other | Admitting: Ophthalmology

## 2021-09-04 DIAGNOSIS — H3589 Other specified retinal disorders: Secondary | ICD-10-CM

## 2021-09-04 DIAGNOSIS — H3321 Serous retinal detachment, right eye: Secondary | ICD-10-CM | POA: Diagnosis not present

## 2021-09-04 DIAGNOSIS — I10 Essential (primary) hypertension: Secondary | ICD-10-CM | POA: Diagnosis not present

## 2021-09-04 DIAGNOSIS — H401231 Low-tension glaucoma, bilateral, mild stage: Secondary | ICD-10-CM

## 2021-09-04 DIAGNOSIS — H33101 Unspecified retinoschisis, right eye: Secondary | ICD-10-CM

## 2021-09-04 DIAGNOSIS — H35033 Hypertensive retinopathy, bilateral: Secondary | ICD-10-CM | POA: Diagnosis not present

## 2021-09-04 DIAGNOSIS — Z961 Presence of intraocular lens: Secondary | ICD-10-CM

## 2021-09-05 ENCOUNTER — Encounter (INDEPENDENT_AMBULATORY_CARE_PROVIDER_SITE_OTHER): Payer: Self-pay | Admitting: Ophthalmology

## 2021-09-05 ENCOUNTER — Encounter: Payer: Self-pay | Admitting: Physical Therapy

## 2021-09-05 ENCOUNTER — Ambulatory Visit: Payer: Medicare Other | Admitting: Physical Therapy

## 2021-09-05 DIAGNOSIS — R296 Repeated falls: Secondary | ICD-10-CM | POA: Diagnosis not present

## 2021-09-05 DIAGNOSIS — M6281 Muscle weakness (generalized): Secondary | ICD-10-CM | POA: Diagnosis not present

## 2021-09-05 DIAGNOSIS — Z85828 Personal history of other malignant neoplasm of skin: Secondary | ICD-10-CM | POA: Diagnosis not present

## 2021-09-05 DIAGNOSIS — C44622 Squamous cell carcinoma of skin of right upper limb, including shoulder: Secondary | ICD-10-CM | POA: Diagnosis not present

## 2021-09-05 DIAGNOSIS — R279 Unspecified lack of coordination: Secondary | ICD-10-CM

## 2021-09-05 DIAGNOSIS — C44329 Squamous cell carcinoma of skin of other parts of face: Secondary | ICD-10-CM | POA: Diagnosis not present

## 2021-09-05 DIAGNOSIS — L570 Actinic keratosis: Secondary | ICD-10-CM | POA: Diagnosis not present

## 2021-09-05 DIAGNOSIS — R2689 Other abnormalities of gait and mobility: Secondary | ICD-10-CM | POA: Diagnosis not present

## 2021-09-05 DIAGNOSIS — C44629 Squamous cell carcinoma of skin of left upper limb, including shoulder: Secondary | ICD-10-CM | POA: Diagnosis not present

## 2021-09-05 NOTE — Therapy (Signed)
[facility] ?[facility] [place] ?[place], [place], [address] ?Phone: [phone]   Fax:  [phone] ? ?Physical Therapy Treatment ? ?Patient Details  ?Name: [name] [name] ?MRN: [id number] ?Date of Birth: 24-May-1961 ?Referring Provider (PT): [name], MD ? ? ?Encounter Date: 09/05/2021 ? ? PT End of Session - 09/05/21 0930   ? ? Visit Number 34   ? Date for PT Re-Evaluation 09/05/21   ? Authorization Type UHC Medicare   ? Authorization - Visit Number 34   ? Authorization - Number of Visits 60   ? Progress Note Due on Visit 40   ? PT Start Time 0930   ? PT Stop Time 1011   ? PT Time Calculation (min) 41 min   ? Equipment Utilized During Treatment Gait belt   ? Activity Tolerance Patient tolerated treatment well   ? Behavior During Therapy [facility] for tasks assessed/performed   ? ?  ?  ? ?  ? ? ?Past Medical History:  ?Diagnosis Date  ? Adenomatous colon polyp 10/1999  ? Anxiety   ? Arthritis   ? neck, yoga helps.  ? Cataract   ? Complex partial seizure ([place])   ? last seizure was 08-30-2018  ? Crohn's disease of small and large intestines ([place]) 1999  ? Depression   ? Diabetes mellitus   ? no meds at this time 10-24-17  ? Esophageal stricture   ? External hemorrhoids   ? GERD (gastroesophageal reflux disease)   ? Glaucoma   ? History of kidney stones   ? 3 large stones still present  ? Hyperlipemia   ? Hypertension   ? resolved with weight loss   ? Inguinal hernia, bilateral 12/2019  ? Liver cirrhosis secondary to NASH [facility])   ? Migraines   ? Osteoporosis   ? PONV (postoperative nausea and vomiting)   ? PTSD (post-traumatic stress disorder)   ? Renal cyst, acquired, left 07/08/2017  ? Skin cancer   ? squamous cell multiple; [facility]; followed every 3 months.  ? Sleep apnea   ? CPAP machine, uses nightly  ? Staphylococcus aureus bacteremia with sepsis ([place]) 05/28/2017  ? MRSA 2012  ? ? ?Past Surgical History:  ?Procedure Laterality Date  ? CATARACT EXTRACTION Bilateral   ?  COLONOSCOPY    ? DEBRIDEMENT MANDIBLE N/A 12/07/2020  ? Procedure: DEBRIDEMENT OF MANDIBLE;  Surgeon: [name], DMD;  Location: [place];  Service: Oral Surgery;  Laterality: N/A;  ? ELBOW SURGERY  2012  ? elbow MRSA infection   ? EYE SURGERY    ? cataracts removed, /w IOL  ? GROSS TEETH DEBRIDEMENT N/A 12/29/2020  ? Procedure: MANDIBULAR  DEBRIDEMENT;  Surgeon: [name], DMD;  Location: [place];  Service: Dentistry;  Laterality: N/A;  ? INCISION AND DRAINAGE ABSCESS N/A 12/07/2020  ? Procedure: INCISION AND DRAINAGE MANDIBULAR ABSCESS;  Surgeon: [name], DMD;  Location: [place];  Service: Oral Surgery;  Laterality: N/A;  ? INGUINAL HERNIA REPAIR Right 01/14/2020  ? Procedure: OPEN RIGHT HERNIA REPAIR INGUINAL WITH MESH;  Surgeon: [name], MD;  [facility];  Service: General;  Laterality: Right;  ? INSERTION OF MESH N/A 07/01/2016  ? Procedure: INSERTION OF MESH;  Surgeon: [name], MD;  Location: [place];  Service: General;  Laterality: N/A;  ? IR URETERAL STENT LEFT NEW ACCESS W/O SEP NEPHROSTOMY CATH  02/02/2018  ? NEPHROLITHOTOMY Left 02/02/2018  ? Procedure: NEPHROLITHOTOMY PERCUTANEOUS;  Surgeon:  [name], MD;  [facility];  Service: Urology;  Laterality: Left;  ? POLYPECTOMY    ? SCALP LACERATION REPAIR Right 10/16/2017  ? From fall/staples  ? SHOULDER ARTHROSCOPY Right 03/05/2018  ? Procedure: ARTHROSCOPY SHOULDER AND OPEN DISTAL CLAVICLE EXCISION;  Surgeon: [name], MD;  Location: [place];  Service: Orthopedics;  Laterality: Right;  ? SHOULDER SURGERY    ? SINUS SURGERY WITH INSTATRAK    ? TEE WITHOUT CARDIOVERSION N/A 05/09/2017  ? Procedure: TRANSESOPHAGEAL ECHOCARDIOGRAM (TEE);  Surgeon: [name], MD;  Location: [facility];  Service: Cardiovascular;  Laterality: N/A;  ? TOOTH EXTRACTION N/A 12/07/2020  ? Procedure: DENTAL EXTRACTIONS OF TEETH TWENTY TO THIRTY;  Surgeon: [name], DMD;  Location: [place];  Service: Oral Surgery;  Laterality:  N/A;  ? TOOTH EXTRACTION N/A 12/29/2020  ? Procedure: DENTAL RESTORATION/EXTRACTIONS;  Surgeon: [name], DMD;  Location: [place];  Service: Dentistry;  Laterality: N/A;  ? TRANSTHORACIC ECHOCARDIOGRAM  02/22/2011  ? EF 55-65%; increased pattern of LVH with mild conc hypertrophy, abnormal relaxation & increased filling pressure (grade 2 diastolic dysfunction); atrial septum thickened (lipomatous hypertrophy)  ? UMBILICAL HERNIA REPAIR N/A 07/01/2016  ? Procedure: UMBILICAL HERNIA REPAIR WITH MESH;  Surgeon: [name], MD;  Location: [place];  Service: General;  Laterality: N/A;  ? UPPER GASTROINTESTINAL ENDOSCOPY    ? VASECTOMY    ? ? ?There were no vitals filed for this visit. ? ? Subjective Assessment - 09/05/21 0934   ? ? Subjective I haven't fallen this week.  I am 90% sure I am having surgery at the end of the month.  It will be extensive so my wife and I feel like I should stop PT for now and hope to restart PT when I am recovered.  Overall PT has helped me so much with my balance and strength.   ? Pertinent History on disability, has memory problems, liver cirrhosis, Chrons, osteoporosis with multiple fractures including C6, thrombocytopenia, gluacoma (loss of peripheral vision).  Has lost 75lb through walking but concerned about safety   ? Limitations Walking   ? How long can you walk comfortably? 5 miles with walking stick   ? Diagnostic tests C6 fracture and ligamentum flavum tear - non-surgical   ? Patient Stated Goals work on coming down and up stairs, balance   ? Currently in Pain? No/denies   ? ?  ?  ? ?  ? ? ? ? ? [facility] PT Assessment - 09/05/21 0001   ? ?  ? Assessment  ? Medical Diagnosis R29.6 (ICD-10-CM) - Repeated falls   ? Referring Provider (PT) [name], MD   ?  ? Strength  ? Overall Strength Comments LEs WNL bil   ?  ? Ambulation/Gait  ? Gait Pattern Step-through pattern;Decreased arm swing - right;Decreased arm swing - left;Decreased trunk rotation   ? Curb 6: Modified independent  (Device/increase time)   ?  ? Standardized Balance Assessment  ? Standardized Balance Assessment Dynamic Gait Index   ?  ? Dynamic Gait Index  ? Level Surface Normal   ? Change in Gait Speed Normal   ? Gait with Horizontal Head Turns Mild Impairment   improves with repeated practice within each session  ? Gait with Vertical Head Turns Normal   stops walking to change head position  ? Gait and Pivot Turn Normal   ? Step Over Obstacle Mild Impairment   ? Step Around Obstacles Normal   ? Steps  Normal   ? Total Score 22   ? DGI comment: 22/24   ?  ? Timed Up and Go Test  ? TUG Normal TUG   ? Normal TUG (seconds) 10   ? ?  ?  ? ?  ? ? ? ? ? ? ? ? ? ? ? ? ? ? ? ? [facility] Adult PT Treatment/Exercise - 09/05/21 0001   ? ?  ? Exercises  ? Exercises Lumbar;Knee/Hip;Shoulder   ?  ? Lumbar Exercises: Aerobic  ? Nustep L5 x 10' seat 10 arms 11 PT present to discuss status and progress   ?  ? Lumbar Exercises: Seated  ? Sit to Stand 20 reps   ? Sit to Stand Limitations 2x10 feet on balance pad hold 20lb   ?  ? Knee/Hip Exercises: Standing  ? Forward Step Up Both;10 reps;Step Height: 6";Hand Hold: 0;1 set   ? Walking with Sports Cord 15lb reverse walking close supervision by PT x 5 rounds   ? ?  ?  ? ?  ? ? ? ? ? ? Balance Exercises - 09/05/21 0001   ? ?  ? Balance Exercises: Standing  ? Stepping Strategy Anterior;Lateral;Other reps (comment)   with ipsilateral and contralateral UE reach x 10 each  ? Sidestepping Theraband;5 reps   red loop, close supervision  ? Step Over Hurdles / Cones forward step overs with step to pattern leading with Lt/Rt x 2 rounds each, step throughs x 3 rounds each: no LOB, close supervision   ? Lift / Chop Both;5 reps   blue weighted ball  ? Other Standing Exercises cross body punches with trunk rotation and weight shift 5lb UE dumbbells standing on balance pad x 20   ? ?  ?  ? ?  ? ? ? ? ? ? ? PT Short Term Goals - 04/05/21 0926   ? ?  ? PT SHORT TERM GOAL #1  ? Title Patient will complete 3 minute walk  test using LRAD with only supervision to indicate decreased fall risk.   ? Status Achieved   ?  ? PT SHORT TERM GOAL #2  ? Title Pt will verbalize and demo safe strategies for stair pattern (step to fo

## 2021-09-11 DIAGNOSIS — C4492 Squamous cell carcinoma of skin, unspecified: Secondary | ICD-10-CM | POA: Diagnosis not present

## 2021-09-11 DIAGNOSIS — K769 Liver disease, unspecified: Secondary | ICD-10-CM | POA: Diagnosis not present

## 2021-09-11 DIAGNOSIS — C44329 Squamous cell carcinoma of skin of other parts of face: Secondary | ICD-10-CM | POA: Diagnosis not present

## 2021-09-11 DIAGNOSIS — R220 Localized swelling, mass and lump, head: Secondary | ICD-10-CM | POA: Diagnosis not present

## 2021-09-12 ENCOUNTER — Encounter: Payer: Medicare Other | Admitting: Physical Therapy

## 2021-09-12 DIAGNOSIS — Z01818 Encounter for other preprocedural examination: Secondary | ICD-10-CM | POA: Diagnosis not present

## 2021-09-12 DIAGNOSIS — C4492 Squamous cell carcinoma of skin, unspecified: Secondary | ICD-10-CM | POA: Diagnosis not present

## 2021-09-12 DIAGNOSIS — G4730 Sleep apnea, unspecified: Secondary | ICD-10-CM | POA: Diagnosis not present

## 2021-09-12 DIAGNOSIS — K50118 Crohn's disease of large intestine with other complication: Secondary | ICD-10-CM | POA: Diagnosis not present

## 2021-09-12 DIAGNOSIS — E119 Type 2 diabetes mellitus without complications: Secondary | ICD-10-CM | POA: Diagnosis not present

## 2021-09-12 DIAGNOSIS — I10 Essential (primary) hypertension: Secondary | ICD-10-CM | POA: Diagnosis not present

## 2021-09-13 ENCOUNTER — Encounter: Payer: Medicare Other | Admitting: Physical Therapy

## 2021-09-18 DIAGNOSIS — Z20822 Contact with and (suspected) exposure to covid-19: Secondary | ICD-10-CM | POA: Diagnosis not present

## 2021-09-18 DIAGNOSIS — R220 Localized swelling, mass and lump, head: Secondary | ICD-10-CM | POA: Diagnosis not present

## 2021-09-18 DIAGNOSIS — M8580 Other specified disorders of bone density and structure, unspecified site: Secondary | ICD-10-CM | POA: Diagnosis not present

## 2021-09-18 DIAGNOSIS — R519 Headache, unspecified: Secondary | ICD-10-CM | POA: Diagnosis not present

## 2021-09-18 DIAGNOSIS — I10 Essential (primary) hypertension: Secondary | ICD-10-CM | POA: Diagnosis not present

## 2021-09-18 DIAGNOSIS — E119 Type 2 diabetes mellitus without complications: Secondary | ICD-10-CM | POA: Diagnosis not present

## 2021-09-18 DIAGNOSIS — G40209 Localization-related (focal) (partial) symptomatic epilepsy and epileptic syndromes with complex partial seizures, not intractable, without status epilepticus: Secondary | ICD-10-CM | POA: Diagnosis not present

## 2021-09-18 DIAGNOSIS — K5090 Crohn's disease, unspecified, without complications: Secondary | ICD-10-CM | POA: Diagnosis not present

## 2021-09-18 DIAGNOSIS — Z79899 Other long term (current) drug therapy: Secondary | ICD-10-CM | POA: Diagnosis not present

## 2021-09-18 DIAGNOSIS — D0439 Carcinoma in situ of skin of other parts of face: Secondary | ICD-10-CM | POA: Diagnosis not present

## 2021-09-18 DIAGNOSIS — Z888 Allergy status to other drugs, medicaments and biological substances status: Secondary | ICD-10-CM | POA: Diagnosis not present

## 2021-09-18 DIAGNOSIS — K7469 Other cirrhosis of liver: Secondary | ICD-10-CM | POA: Diagnosis not present

## 2021-09-18 DIAGNOSIS — Z882 Allergy status to sulfonamides status: Secondary | ICD-10-CM | POA: Diagnosis not present

## 2021-09-18 DIAGNOSIS — G4730 Sleep apnea, unspecified: Secondary | ICD-10-CM | POA: Diagnosis not present

## 2021-09-18 DIAGNOSIS — Z885 Allergy status to narcotic agent status: Secondary | ICD-10-CM | POA: Diagnosis not present

## 2021-09-18 DIAGNOSIS — Z880 Allergy status to penicillin: Secondary | ICD-10-CM | POA: Diagnosis not present

## 2021-09-18 DIAGNOSIS — K7581 Nonalcoholic steatohepatitis (NASH): Secondary | ICD-10-CM | POA: Diagnosis not present

## 2021-09-18 DIAGNOSIS — C44329 Squamous cell carcinoma of skin of other parts of face: Secondary | ICD-10-CM | POA: Diagnosis not present

## 2021-09-18 DIAGNOSIS — D61818 Other pancytopenia: Secondary | ICD-10-CM | POA: Diagnosis not present

## 2021-09-18 DIAGNOSIS — Z881 Allergy status to other antibiotic agents status: Secondary | ICD-10-CM | POA: Diagnosis not present

## 2021-09-18 DIAGNOSIS — Z872 Personal history of diseases of the skin and subcutaneous tissue: Secondary | ICD-10-CM | POA: Diagnosis not present

## 2021-09-18 DIAGNOSIS — K219 Gastro-esophageal reflux disease without esophagitis: Secondary | ICD-10-CM | POA: Diagnosis not present

## 2021-09-18 DIAGNOSIS — M4810 Ankylosing hyperostosis [Forestier], site unspecified: Secondary | ICD-10-CM | POA: Diagnosis not present

## 2021-09-19 DIAGNOSIS — D61818 Other pancytopenia: Secondary | ICD-10-CM | POA: Diagnosis not present

## 2021-09-19 DIAGNOSIS — C44329 Squamous cell carcinoma of skin of other parts of face: Secondary | ICD-10-CM | POA: Diagnosis not present

## 2021-09-19 DIAGNOSIS — G40209 Localization-related (focal) (partial) symptomatic epilepsy and epileptic syndromes with complex partial seizures, not intractable, without status epilepticus: Secondary | ICD-10-CM | POA: Diagnosis not present

## 2021-09-19 DIAGNOSIS — K7469 Other cirrhosis of liver: Secondary | ICD-10-CM | POA: Diagnosis not present

## 2021-09-19 DIAGNOSIS — K5090 Crohn's disease, unspecified, without complications: Secondary | ICD-10-CM | POA: Diagnosis not present

## 2021-09-19 DIAGNOSIS — E119 Type 2 diabetes mellitus without complications: Secondary | ICD-10-CM | POA: Diagnosis not present

## 2021-09-27 ENCOUNTER — Telehealth: Payer: Self-pay | Admitting: Gastroenterology

## 2021-09-27 DIAGNOSIS — C44622 Squamous cell carcinoma of skin of right upper limb, including shoulder: Secondary | ICD-10-CM | POA: Diagnosis not present

## 2021-09-27 DIAGNOSIS — Z85828 Personal history of other malignant neoplasm of skin: Secondary | ICD-10-CM | POA: Diagnosis not present

## 2021-09-27 DIAGNOSIS — C44629 Squamous cell carcinoma of skin of left upper limb, including shoulder: Secondary | ICD-10-CM | POA: Diagnosis not present

## 2021-09-27 NOTE — Telephone Encounter (Signed)
Patient is currently undergoing treatment of SCC, and his wife has called in with questions regarding the medication Acitretin. Dr. Elvera Lennox with Dermatology would like to start him on it soon, however patient has been treated here for cirrhosis and there are concerns with starting this medication. Patient's wife was told to call & ask Dr. Fuller Plan for his recommendations to weight out benefits/risk. Will route to MD.  ?

## 2021-10-01 NOTE — Telephone Encounter (Signed)
Spoke with patient's wife regarding MD recommendations. She is aware that Dr. Fuller Plan is currently out of the office, and that I will be in touch with her again once he has had a chance to review. ?

## 2021-10-03 ENCOUNTER — Other Ambulatory Visit: Payer: Self-pay

## 2021-10-03 ENCOUNTER — Telehealth: Payer: Self-pay

## 2021-10-03 DIAGNOSIS — K7460 Unspecified cirrhosis of liver: Secondary | ICD-10-CM

## 2021-10-03 DIAGNOSIS — C4492 Squamous cell carcinoma of skin, unspecified: Secondary | ICD-10-CM | POA: Diagnosis not present

## 2021-10-03 DIAGNOSIS — R5383 Other fatigue: Secondary | ICD-10-CM | POA: Diagnosis not present

## 2021-10-03 NOTE — Telephone Encounter (Signed)
Patient is due for 6 month RUQ [place] & lab work. Orders have been placed & schedulers notified.  ?

## 2021-10-03 NOTE — Telephone Encounter (Signed)
Spoke with patient's wife regarding Dr. Lynne Leader recommendations. She will make sure that Dermatologist is aware. She asked that it be sent to [facility] as well. No further questions. ?

## 2021-10-08 DIAGNOSIS — K5090 Crohn's disease, unspecified, without complications: Secondary | ICD-10-CM | POA: Diagnosis not present

## 2021-10-08 DIAGNOSIS — G40209 Localization-related (focal) (partial) symptomatic epilepsy and epileptic syndromes with complex partial seizures, not intractable, without status epilepticus: Secondary | ICD-10-CM | POA: Diagnosis not present

## 2021-10-08 DIAGNOSIS — K7581 Nonalcoholic steatohepatitis (NASH): Secondary | ICD-10-CM | POA: Diagnosis not present

## 2021-10-08 DIAGNOSIS — K7460 Unspecified cirrhosis of liver: Secondary | ICD-10-CM | POA: Diagnosis not present

## 2021-10-08 DIAGNOSIS — R4789 Other speech disturbances: Secondary | ICD-10-CM | POA: Diagnosis not present

## 2021-10-08 DIAGNOSIS — E669 Obesity, unspecified: Secondary | ICD-10-CM | POA: Diagnosis not present

## 2021-10-08 DIAGNOSIS — R413 Other amnesia: Secondary | ICD-10-CM | POA: Diagnosis not present

## 2021-10-08 DIAGNOSIS — Z6823 Body mass index (BMI) 23.0-23.9, adult: Secondary | ICD-10-CM | POA: Diagnosis not present

## 2021-10-08 DIAGNOSIS — I10 Essential (primary) hypertension: Secondary | ICD-10-CM | POA: Diagnosis not present

## 2021-10-08 DIAGNOSIS — K769 Liver disease, unspecified: Secondary | ICD-10-CM | POA: Insufficient documentation

## 2021-10-08 DIAGNOSIS — R410 Disorientation, unspecified: Secondary | ICD-10-CM | POA: Diagnosis not present

## 2021-10-08 DIAGNOSIS — K7469 Other cirrhosis of liver: Secondary | ICD-10-CM | POA: Diagnosis not present

## 2021-10-08 DIAGNOSIS — R269 Unspecified abnormalities of gait and mobility: Secondary | ICD-10-CM | POA: Diagnosis not present

## 2021-10-08 DIAGNOSIS — Z79899 Other long term (current) drug therapy: Secondary | ICD-10-CM | POA: Diagnosis not present

## 2021-10-08 DIAGNOSIS — E119 Type 2 diabetes mellitus without complications: Secondary | ICD-10-CM | POA: Diagnosis not present

## 2021-10-10 ENCOUNTER — Telehealth: Payer: Self-pay

## 2021-10-10 NOTE — Telephone Encounter (Signed)
Needing CPAP for Mondays appt  ?

## 2021-10-11 NOTE — Telephone Encounter (Signed)
LVM. Attempt #2 to remind pt to bring cpap.  ?

## 2021-10-12 ENCOUNTER — Emergency Department (HOSPITAL_BASED_OUTPATIENT_CLINIC_OR_DEPARTMENT_OTHER)
Admission: EM | Admit: 2021-10-12 | Discharge: 2021-10-12 | Disposition: A | Discharge status: Home / Self Care | Payer: Medicare Other | Attending: Emergency Medicine | Admitting: Emergency Medicine

## 2021-10-12 ENCOUNTER — Emergency Department (HOSPITAL_BASED_OUTPATIENT_CLINIC_OR_DEPARTMENT_OTHER): Payer: Medicare Other | Admitting: Radiology

## 2021-10-12 ENCOUNTER — Other Ambulatory Visit: Payer: Self-pay

## 2021-10-12 ENCOUNTER — Emergency Department (HOSPITAL_BASED_OUTPATIENT_CLINIC_OR_DEPARTMENT_OTHER): Payer: Medicare Other

## 2021-10-12 DIAGNOSIS — R42 Dizziness and giddiness: Secondary | ICD-10-CM | POA: Insufficient documentation

## 2021-10-12 DIAGNOSIS — W01198A Fall on same level from slipping, tripping and stumbling with subsequent striking against other object, initial encounter: Secondary | ICD-10-CM | POA: Diagnosis not present

## 2021-10-12 DIAGNOSIS — M546 Pain in thoracic spine: Secondary | ICD-10-CM | POA: Diagnosis not present

## 2021-10-12 DIAGNOSIS — Y9301 Activity, walking, marching and hiking: Secondary | ICD-10-CM | POA: Diagnosis not present

## 2021-10-12 DIAGNOSIS — G8911 Acute pain due to trauma: Secondary | ICD-10-CM | POA: Diagnosis not present

## 2021-10-12 DIAGNOSIS — S299XXA Unspecified injury of thorax, initial encounter: Secondary | ICD-10-CM | POA: Diagnosis not present

## 2021-10-12 NOTE — ED Triage Notes (Signed)
Reported fall last Monday; reported hx of dizziness and balance issue; c/o back pain; reported he got dizzy first then fell, landing on his butt and hit the back of head; possibly loss of consciousness for a minute maybe;  ?

## 2021-10-12 NOTE — Discharge Instructions (Signed)
Your x-ray and CT scan today did not demonstrate any definitive acute fractures to the spine.  You have an old appearing mild compression fracture at T6. ?

## 2021-10-12 NOTE — ED Notes (Signed)
RT note: Pt. was able to give Urine sample if needed, was labelled and given to Lab, RN made aware. ?

## 2021-10-12 NOTE — ED Provider Notes (Signed)
?[facility] ?Provider Note ? ? ?CSN: [id number] ?Arrival date & time: 10/12/21  1251 ? ?  ? ?History ? ?Chief Complaint  ?Patient presents with  ? Fall  ? ? ?[name] [name] is a 61 y.o. male. ? ?Patient with history of unsteadiness on his feet, confusion, walks with walking sticks for balance, history of osteoporosis --presents to the emergency department for evaluation of back pain after a fall occurring Monday evening (today is Friday).  Patient was walking on asphalt when he became dizzy.  He tried to get onto a grassy area but did fall onto the asphalt.  He fell onto his bottom and then rolled.  He may have hit his head, but per wife at bedside, he has been acting normally at his baseline since that time.  There is no signs of head trauma, bleeding, bruising, swelling.  He has been taking medication at home including oxycodone, for back pain but was continuing to worsen.  He has 1 spot in his mid back that is particularly tender and painful.  No weakness, numbness, or tingling in the arms or legs.  No neck pain at this time.  No headache or vomiting. ? ? ?  ? ?Home Medications ?Prior to Admission medications   ?Medication Sig Start Date End Date Taking? Authorizing Provider  ?Calcium Carbonate-Vitamin D (CALCIUM 600+D PO) Take 1 tablet by mouth 2 (two) times daily.    [provider]  ?chlorhexidine (PERIDEX) 0.12 % solution Use as directed 15 mLs in the mouth or throat in the morning, at noon, and at bedtime. 12/30/20   Danford, Suann Larry, MD  ?cholecalciferol (VITAMIN D) 1000 units tablet Take 1,000 Units at bedtime by mouth.     [provider]  ?cycloSPORINE (RESTASIS) 0.05 % ophthalmic emulsion Place 1 drop into both eyes 2 (two) times daily. 05/04/21   [provider]  ?esomeprazole (NEXIUM) 40 MG capsule Take 1 capsule (40 mg total) by mouth 2 (two) times daily before a meal. 06/04/21   Ladene Artist, MD  ?Eszopiclone 3 MG TABS Take 3 mg by mouth  at bedtime. Take immediately before bedtime    [provider]  ?feeding supplement (ENSURE ENLIVE / ENSURE PLUS) LIQD Take 237 mLs by mouth 2 (two) times daily between meals. 12/11/20   Cristal Deer, MD  ?folic acid (FOLVITE) 1 MG tablet Take 2 tablets (2 mg total) by mouth daily. 01/11/20   Daleen Squibb, MD  ?glucose blood (CHOICE DM FORA G20 TEST STRIPS) test strip Use as instructed 11/13/12   [name], MD  ?imiquimod Leroy Sea) 5 % cream Apply 1 packet topically 2 (two) times a week. ?Patient not taking: Reported on 07/12/2021 05/02/21   [provider]  ?Lactase (DAIRY RELIEF PO) Take 2 tablets by mouth in the morning and at bedtime.    [provider]  ?lamoTRIgine (LAMICTAL) 150 MG tablet Take 1 tablet (150 mg total) by mouth 2 (two) times daily. 11/09/20   Lomax, Amy, NP  ?latanoprost (XALATAN) 0.005 % ophthalmic solution Place 1 drop into both eyes at bedtime.     [provider]  ?mercaptopurine (PURINETHOL) 50 MG tablet TAKE 2 TABLETS DAILY ON AN               EMPTY STOMACH 1 HOUR BEFORE              OR 2 HOURS AFTER A MEAL.  CAUTION: CHEMOTHERAPY. NEED APPOINTMENT. ?Patient not taking: Reported on 08/03/2021 06/04/21   Ladene Artist, MD  ?mesalamine (PENTASA) 500 MG CR capsule Take 1,000 mg by mouth daily.    [provider]  ?methscopolamine (PAMINE FORTE) 5 MG tablet Take 1 tablet (5 mg total) by mouth 2 (two) times daily. 06/04/21   Ladene Artist, MD  ?Multiple Vitamin (MULTIVITAMIN WITH MINERALS) TABS tablet Take 1 tablet by mouth daily. 12/12/20   Cristal Deer, MD  ?ONE TOUCH ULTRA TEST test strip USE AS INSTRUCTED    Weber, Damaris Hippo, PA-C  ?potassium chloride SA (KLOR-CON) 20 MEQ tablet Take 1 tablet (20 mEq total) by mouth 2 (two) times daily. 01/11/20   Daleen Squibb, MD  ?Probiotic Product (PROBIOTIC PO) Take 1 tablet at bedtime by mouth.     [provider]  ?rifaximin (XIFAXAN) 550 MG TABS tablet  Take 1 tablet (550 mg total) by mouth 2 (two) times daily. 06/04/21   Ladene Artist, MD  ?sertraline (ZOLOFT) 100 MG tablet Take 200 mg daily with breakfast by mouth.  03/16/17   [provider]  ?tolterodine (DETROL LA) 4 MG 24 hr capsule Take 4 mg by mouth daily. 01/25/21   [provider]  ?traZODone (DESYREL) 150 MG tablet Take 150 mg by mouth at bedtime.    [provider]  ?   ? ?Allergies    ?Azithromycin, Beef-derived products, Milk-related compounds, Penicillins, Sulfonamide derivatives, and Dilaudid [hydromorphone hcl]   ? ?Review of Systems   ?Review of Systems ? ?Physical Exam ?Updated Vital Signs ?BP (!) 156/87 (BP Location: Right Arm)   Pulse 81   Temp 98.5 ?F (36.9 ?C)   Resp 18   Ht 5' 11"  (1.803 m)   Wt 72.6 kg   SpO2 100%   BMI 22.32 kg/m?  ? ?Physical Exam ?Vitals and nursing note reviewed.  ?Constitutional:   ?   Appearance: He is well-developed.  ?HENT:  ?   Head: Normocephalic and atraumatic.  ?Eyes:  ?   Conjunctiva/sclera: Conjunctivae normal.  ?Abdominal:  ?   Palpations: Abdomen is soft.  ?   Tenderness: There is no abdominal tenderness. There is no right CVA tenderness or left CVA tenderness.  ?Musculoskeletal:     ?   General: Normal range of motion.  ?   Cervical back: Normal range of motion. No spasms or tenderness. Normal range of motion.  ?   Thoracic back: Tenderness and bony tenderness present. Normal range of motion.  ?   Lumbar back: No tenderness or bony tenderness.  ?   Comments: No step-off noted with palpation of spine.  ? ?Patient with point tenderness over the mid thoracic spine and right paraspinous muscles.  ?Skin: ?   General: Skin is warm and dry.  ?Neurological:  ?   Mental Status: He is alert.  ?   Sensory: No sensory deficit.  ?   Motor: No abnormal muscle tone.  ?   Deep Tendon Reflexes: Reflexes are normal and symmetric.  ?   Comments: 5/5 strength in entire lower extremities bilaterally. No sensation deficit.   ?Psychiatric:     ?    Mood and Affect: Mood normal.  ? ? ?ED Results / Procedures / Treatments   ?Labs ?(all labs ordered are listed, but only abnormal results are displayed) ?Labs Reviewed - No data to display ? ?EKG ?None ? ?Radiology ?DG Thoracic Spine 2 View ? ?Result Date: 10/12/2021 ?CLINICAL DATA:  Thoracic spine  pain after fall several days ago. EXAM: THORACIC SPINE 2 VIEWS COMPARISON:  None. FINDINGS: There is no evidence of thoracic spine fracture. Alignment is normal. Mild anterior osteophyte formation is seen at multiple levels in the lower thoracic spine. IMPRESSION: Mild multilevel degenerative changes are noted. No acute abnormality seen. Electronically Signed   By: Marijo Conception M.D.   On: 10/12/2021 13:50  ? ?CT Thoracic Spine Wo Contrast ? ?Result Date: 10/12/2021 ?CLINICAL DATA:  Back trauma, no prior imaging (Age >= 16y) x-ray neg, point tender; fell last week with right upper back pain EXAM: CT THORACIC SPINE WITHOUT CONTRAST TECHNIQUE: Multidetector CT images of the thoracic were obtained using the standard protocol without intravenous contrast. RADIATION DOSE REDUCTION: This exam was performed according to the departmental dose-optimization program which includes automated exposure control, adjustment of the mA and/or kV according to patient size and/or use of iterative reconstruction technique. COMPARISON:  2019 FINDINGS: Alignment: No significant listhesis. Vertebrae: Decreased osseous mineralization. Mild chronic anterior vertebral body wedging. There is mild loss of height at the T6 superior endplate that is new from 2019. No destructive osseous lesion. Paraspinal and other soft tissues: Unremarkable. Disc levels: Minor degenerative changes are present. No high-grade osseous encroachment on the spinal canal. IMPRESSION: No definite acute fracture. There is mild compression deformity of T6, which is new since 2018. This is age-indeterminate but favored to be chronic. At the calcaneal main right upper back  Electronically Signed   By: Macy Mis M.D.   On: 10/12/2021 14:44   ? ?Procedures ?Procedures  ? ? ?Medications Ordered in ED ?Medications - No data to display ? ?ED Course/ Medical Decision Making/ A&P ?  ?

## 2021-10-15 ENCOUNTER — Ambulatory Visit (INDEPENDENT_AMBULATORY_CARE_PROVIDER_SITE_OTHER): Payer: Medicare Other | Admitting: Family Medicine

## 2021-10-15 ENCOUNTER — Encounter: Payer: Self-pay | Admitting: Family Medicine

## 2021-10-15 VITALS — BP 134/77 | HR 70 | Ht 71.0 in | Wt 166.0 lb

## 2021-10-15 DIAGNOSIS — G8929 Other chronic pain: Secondary | ICD-10-CM

## 2021-10-15 DIAGNOSIS — R569 Unspecified convulsions: Secondary | ICD-10-CM

## 2021-10-15 DIAGNOSIS — M546 Pain in thoracic spine: Secondary | ICD-10-CM | POA: Diagnosis not present

## 2021-10-15 DIAGNOSIS — C44329 Squamous cell carcinoma of skin of other parts of face: Secondary | ICD-10-CM

## 2021-10-15 DIAGNOSIS — G4733 Obstructive sleep apnea (adult) (pediatric): Secondary | ICD-10-CM | POA: Diagnosis not present

## 2021-10-15 DIAGNOSIS — Z9989 Dependence on other enabling machines and devices: Secondary | ICD-10-CM

## 2021-10-15 DIAGNOSIS — R296 Repeated falls: Secondary | ICD-10-CM

## 2021-10-15 MED ORDER — LAMOTRIGINE 150 MG PO TABS: 150.0000 mg | ORAL_TABLET | Freq: Two times a day (BID) | ORAL | 3 refills | Status: AC

## 2021-10-15 NOTE — Patient Instructions (Addendum)
Please continue using your CPAP regularly. While your insurance requires that you use CPAP at least 4 hours each night on 70% of the nights, I recommend, that you not skip any nights and use it throughout the night if you can. Getting used to CPAP and staying with the treatment long term does take time and patience and discipline. Untreated obstructive sleep apnea when it is moderate to severe can have an adverse impact on cardiovascular health and raise her risk for heart disease, arrhythmias, hypertension, congestive heart failure, stroke and diabetes. Untreated obstructive sleep apnea causes sleep disruption, nonrestorative sleep, and sleep deprivation. This can have an impact on your day to day functioning and cause daytime sleepiness and impairment of cognitive function, memory loss, mood disturbance, and problems focussing. Using CPAP regularly can improve these symptoms. ? ?Please make sure you are consistent with timing of seizure medication. I recommend annual visit with primary care provider (PCP) for complete physical and routine blood work. We will monitor vitamin D level. I recommend daily intake of vitamin D (400-800iu) and calcium (800-1066m) for bone health. Discuss Dexa screening with PCP.  ? ?According to [place], you can not drive unless you are seizure / syncope free for at least 6 months and under physician's care. ? ?Please maintain precautions. Do not participate in activities where a loss of awareness could harm you or someone else. No swimming alone, no tub bathing, no hot tubs, no driving, no operating motorized vehicles (cars, ATVs, motocycles, etc), lawnmowers, power tools or firearms. No standing at heights, such as rooftops, ladders or stairs. Avoid hot objects such as stoves, heaters, open fires. Wear a helmet when riding a bicycle, scooter, skateboard, etc. and avoid areas of traffic. Set your water heater to 120 degrees or less.  ? ?Continue lamotrigine 1569mtwice daily. I will  update labs, today. Continue close follow up with psychiatry. ? ?Please use you walking sticks to help with stability!   ? ?Follow up with Dr DoBrett Fairyor discussion of CPAP versus Inspire in 4-6 months.  ?

## 2021-10-15 NOTE — Progress Notes (Signed)
? ? ?PATIENT: MELVEN STOCKARD ?DOB: 23-Sep-1960 ? ?REASON FOR VISIT: follow up ?HISTORY FROM: patient ? ?Chief Complaint  ?Patient presents with  ? Follow-up  ?  RM 12. Last seen 07/03/20.  Still falling, still having seizures. Episodes twice a week. Having confusion/dizziness/balance issues. Last fall last Monday. Hit head. Had xray/CT of back and looked ok. Still having pain mid back area.   ?  ? ?HISTORY OF PRESENT ILLNESS: ? ?10/15/21 ALL: ?Jermale returns for follow up for OSA and history of seizure/non epileptic events. He continues lamotrigine 193m BID.  ? ?He was diagnosed with osteomyelitis following Mohs treatment in 09/2020 and has had multiple surgical procedures on eye and skin over the past year. He was hospitalized 6/13-6/20/2022 for IV therapy. He had extractions of all teeth in lower jaw. Surgeries and complications thereafter have interrupted use of CPAP.  He has not used CPAP in about a year. He did resume use last night and did fairly well. He is using FFM. He was not able to tolerate nasal pillow.  ? ?He continues to have dizziness nearly every day. His wife reports about 20lb weight loss over the past year. He is not eating well. He admits that he does not drink water. He drinks Cola, mostly. He continues to have periods of worsening dizziness, disorientation and confusion. Seizure workup has been benign. He does continue lamotrigine. He is followed closely by PCP and psychiatry. He has had a few falls. Fortunately no significant injuries. He has significant back pain. He being seen by PT.  ? ?07/03/2020 ALL:  ?TArnezreturns for follow up. He is using CPAP nightly without concerns. His old machine was broken and new machine received about 1 month ago. He is doing well with new machine. Compliance report reveals 100% compliance from daily and 4 hour usage. Residual AHI 3.8 on 6-16cmH20 and EPR 3. No significant leak noted.  ? ?He continues lamotrigine 1572mBID. No recent seizures. [facility] concerns of dizziness, memory loss. Workup consistent with nonepileptic events. He continues lamotrigine 15070mID for previous history of partial seizures with lip smacking and starring. Ambulatory EEG declined by patient. Psychiatry and psychology continue to follow regularly. He feels mood is stable.   ? ?Neurophthalmology is following him for glaucoma and optic neuropathy. He has significant peripheral vision loss contributing to imbalance and dizziness. He was evaluated by OT. He is seen annually at this time and sees a local ophthalmologist every 3 months for pressure checks.  ? ?He is now seeing Dr AarMarin Commentor PCP needs and Dr StaFuller PlanI, for cirrhosis. He has liver [place] 6 months.   ? ?History (copied from previous note) ? ?TonLLEWYN HEAP a 60 39o. male here today for follow up for seizures and CPAP compliance. He continues lamotrigine 150m12mice daily. He feels that he is stable from a seizure perspective. He does continues to have spells where he has an overwhelming feeling that comes over him. In the summer he may start sweating. He feels that he is unable to respond to what is going on around him but is completely aware of his surroundings. Sometimes he does not recognize who family members are for a few hours. Memory always returns. This happens about 10 times a year.  No LOC, tongue biting or incontinence. Multiple changes in epileptic medications have not improved spells. He feels they happen 1-2 times a month.  ? ?He continues to  see psychiatry monthly and counselor every other week. He continues Zoloft and Haldol. He feels he is doing fairly well. He continues to see PCP regularly. He has had three falls in the past three years. He reports getting dizzy after about 15 minutes of walking. He reports that PCP is following and he has scheduled follow up next week.  ? ?Compliance report dated 05/31/2019 through 06/29/2019 reveals that he used CPAP 29 of the last 30 days  for compliance of 97%. 25 of the last 30 days he used CPAP greater than 4 hours for compliance of 83%. Average usage was 6 hours and 47 minutes. Residual AHI was 3.7 on 6 to 16 cm of water and an EPR of 3. He did have a leak noted in the 95th percentile of 36.8. He states that he is able to tighten his mask. ? ? ?HISTORY: (copied from my note on 12/28/2018) ? ?EMMAUS BRANDI is a 61 y.o. male here today for follow up of OSA on CPAP and seizure like episodes (followed by Dr Krista Blue).  ?  ?Compliance report dated 11/23/2021 12/23/2018 reveals that he is using CPAP 30 out of the last 30 days for compliance 100%.  He is using CPAP greater than 4 hours 29 out of the last 30 days for compliance 97%.  AHI was 2.9 on 6 to 16 cm of water and EPR of 3.  There was a mild leak noted in the 95th percentile of 24.2. He is in need of new supplies.  ?  ?He continues to have 2-3 seizure like events per week with confusion, dizziness, anger, headaches and memory loss. He is taking Lamictal 123m twice daily as prescribed. Dose was increased in 2019 but he does not feel this has helped. He has been on multiple antiepileptic medications in the past with no relief of seizure like events. He is seeing psychiatry for high stress levels, depression and potential conversion disorder. He is seen regularly. He denies urinary incontinence, LOC or biting of tongue. He does not drive.  ?  ?  ?HISTORY: (copied from Dr Dohmeier's note on 02/18/2018) ?  ?HISTORY FROM: Patient here alone for CPAP compliance. Mr.Farro, 61year old caucasian male is seen here on 02-18-2018, he is now disabled used to work in [employer], technical sales He has OSA and used CPAP for years . He has been suffering a right shoulder and scapula  injury when his dog caused him to fall, surgery pending with Dr. DRhona Raider ?He has been diagnosed with 9 kidney stones, these were removed surgically. He used a couple of days pain killers, was give #40 of percocet, but used sparingly.  ?He has a family history of  addiction, brother and uncle affected. ?  ?The patient underwent a home sleep test on apnea link on 30 Oct 2017, goal was to replace his by that time 60year-old machine.  The home sleep test confirmed an AHI of 32, RDI of 34.2/h, 32 minutes of oxygen desaturation under 89%.  The patient qualified with these results of course for new CPAP.  It was an auto titration capable machine with a pressure window between 6 and 16 cmH2O and 3 cm EPR mask of his choice.  He has used the machine compliantly for 93% of the last 30 days, 6 hours and 9 minutes of his average use of time, the residual AHI is 5.1 dated 02-15-18.  Pressure at the 95th percentile is 12 cmH2O air leaks are mild and have actually improved over the  last 14 days.  He has tightened the headgear at that time he reports. Uses a FFM-  ?  ?He remains depressed , sees a therapist.  He remains excessively daytime sleepy ; his current Epworth sleepiness score is endorsed at 21/24 points, fatigue severity at 30 / 63 points.  His residual AHI of 5.1/h can hardly explain the degree of excessive daytime sleepiness - I wonder if it Crohn's , recovery from kidney surgery,  The severe depression or other metabolic or illness related factors. ?  ?  ?Dr Rhea Belton interval visit :He had a past medical history of diabetes, hypertension, Crohn's disease, on immunosuppressive treatment, ?He presented with seizure-like event, reported initial event was June 28 2014, he was found by his wife to difficulty to be awake and eyes closed, took a while for him to open his eyes, and become very confused, also angry, episodes last about 5-10 minutes, there was also witnessed leg extension, jerking movement, ?He had multiple recurrent confusion episodes later, MRI of the brain, MRA of the brain in April 2013 was normal,EEG showed intermittent left anterior and medial temporal lobe slowing ?  ?Interval history from 03/11/2017, I have the pleasure of seeing Mr Jentz once a year for compliance  visit and his CPAP compliance is excellent. He is used to machine 97% of the time over 4 hours 100% of the last 30 days, his average user time of 6 hours 27 minutes the machine is set at 10 cm pressure this

## 2021-10-16 DIAGNOSIS — Z882 Allergy status to sulfonamides status: Secondary | ICD-10-CM | POA: Diagnosis not present

## 2021-10-16 DIAGNOSIS — K7460 Unspecified cirrhosis of liver: Secondary | ICD-10-CM | POA: Diagnosis not present

## 2021-10-16 DIAGNOSIS — C4492 Squamous cell carcinoma of skin, unspecified: Secondary | ICD-10-CM | POA: Diagnosis not present

## 2021-10-16 DIAGNOSIS — Z5112 Encounter for antineoplastic immunotherapy: Secondary | ICD-10-CM | POA: Diagnosis not present

## 2021-10-16 DIAGNOSIS — M869 Osteomyelitis, unspecified: Secondary | ICD-10-CM | POA: Diagnosis not present

## 2021-10-16 DIAGNOSIS — K5090 Crohn's disease, unspecified, without complications: Secondary | ICD-10-CM | POA: Diagnosis not present

## 2021-10-16 DIAGNOSIS — R42 Dizziness and giddiness: Secondary | ICD-10-CM | POA: Diagnosis not present

## 2021-10-16 DIAGNOSIS — G893 Neoplasm related pain (acute) (chronic): Secondary | ICD-10-CM | POA: Diagnosis not present

## 2021-10-16 DIAGNOSIS — Z8614 Personal history of Methicillin resistant Staphylococcus aureus infection: Secondary | ICD-10-CM | POA: Diagnosis not present

## 2021-10-16 DIAGNOSIS — M810 Age-related osteoporosis without current pathological fracture: Secondary | ICD-10-CM | POA: Diagnosis not present

## 2021-10-16 DIAGNOSIS — E1169 Type 2 diabetes mellitus with other specified complication: Secondary | ICD-10-CM | POA: Diagnosis not present

## 2021-10-16 DIAGNOSIS — Z79899 Other long term (current) drug therapy: Secondary | ICD-10-CM | POA: Diagnosis not present

## 2021-10-16 DIAGNOSIS — G40209 Localization-related (focal) (partial) symptomatic epilepsy and epileptic syndromes with complex partial seizures, not intractable, without status epilepticus: Secondary | ICD-10-CM | POA: Diagnosis not present

## 2021-10-16 LAB — LAMOTRIGINE LEVEL: Lamotrigine Lvl: 12.8 ug/mL (ref 2.0–20.0)

## 2021-10-17 NOTE — Progress Notes (Signed)
?[facility] Note ? ?10/30/2021 ? ?  ? ?CHIEF COMPLAINT ?Patient presents for Retina Follow Up ? ?HISTORY OF PRESENT ILLNESS: ?ANVAY TENNIS is a 61 y.o. male who presents to the clinic today for:  ? ?HPI   ? ? Retina Follow Up   ?Patient presents with  Other.  In right eye.  This started 8 weeks ago.  I, the attending physician,  performed the HPI with the patient and updated documentation appropriately. ? ?  ?  ? ? Comments   ?Patient here for 8 weeks retina follow up for retinoschisis OD. Patient states vision is ok. Every day gets appears to be a film over OU. OD > OS. Can't get it off. No eye pain. A medicine was added last week Acitretin 25 mg.  ? ?  ?  ?Last edited by Bernarda Caffey, MD on 10/31/2021  1:29 PM.  ?  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 ?Referring physician: ?Lisabeth Pick, MD ?[address] ?[place],  [place] [address] ? ?HISTORICAL INFORMATION:  ? ?Selected notes from the [facility] ?Referred by Dr Lucianne Lei for concern of shallow RD OD  ? ?CURRENT MEDICATIONS: ?Current Outpatient Medications (Ophthalmic Drugs)  ?Medication Sig  ? cycloSPORINE (RESTASIS) 0.05 % ophthalmic emulsion Place 1 drop into both eyes 2 (two) times daily.  ? latanoprost (XALATAN) 0.005 % ophthalmic solution Place 1 drop into both eyes at bedtime.   ? ?No current facility-administered medications for this visit. (Ophthalmic Drugs)  ? ?Current Outpatient Medications (Other)  ?Medication Sig  ? Calcium Carbonate-Vitamin D (CALCIUM 600+D PO) Take 1 tablet by mouth 2 (two) times daily.  ? cemiplimab-rwlc (LIBTAYO) 350 MG/7ML SOLN injection Inject into the vein every 6 (six) weeks.  ? chlorhexidine (PERIDEX) 0.12 % solution Use as directed 15 mLs in the mouth or throat in the morning, at noon, and at bedtime.  ? cholecalciferol  (VITAMIN D) 1000 units tablet Take 1,000 Units at bedtime by mouth.   ? esomeprazole (NEXIUM) 40 MG capsule Take 1 capsule (40 mg total) by mouth 2 (two) times daily before a meal.  ? Eszopiclone 3 MG TABS Take 3 mg by mouth at bedtime. Take immediately before bedtime  ? feeding supplement (ENSURE ENLIVE / ENSURE PLUS) LIQD Take 237 mLs by mouth 2 (two) times daily between meals.  ? folic acid (FOLVITE) 1 MG tablet Take 2 tablets (2 mg total) by mouth daily.  ? glucose blood (CHOICE DM FORA G20 TEST STRIPS) test strip Use as instructed  ? Lactase (DAIRY RELIEF PO) Take 2 tablets by mouth in the morning and at bedtime.  ? lamoTRIgine (LAMICTAL) 150 MG tablet Take 1 tablet (150 mg total) by mouth 2 (two) times daily.  ? mesalamine (PENTASA) 500 MG CR capsule Take 1,000 mg by mouth daily.  ? methscopolamine (PAMINE FORTE) 5 MG tablet Take 1 tablet (5 mg total) by mouth 2 (two) times daily.  ? ONE TOUCH ULTRA TEST test strip USE AS INSTRUCTED  ? potassium chloride SA (KLOR-CON) 20 MEQ tablet Take 1 tablet (20 mEq total) by mouth 2 (two) times daily.  ? Probiotic Product (PROBIOTIC PO) Take 1 tablet at bedtime by mouth.   ? rifaximin (XIFAXAN) 550 MG TABS tablet Take 1 tablet (550 mg  total) by mouth 2 (two) times daily.  ? sertraline (ZOLOFT) 100 MG tablet Take 200 mg daily with breakfast by mouth.   ? tolterodine (DETROL LA) 4 MG 24 hr capsule Take 4 mg by mouth daily.  ? traZODone (DESYREL) 150 MG tablet Take 150 mg by mouth at bedtime.  ? ?No current facility-administered medications for this visit. (Other)  ? ?Facility-Administered Medications Ordered in Other Visits (Other)  ?Medication Route  ? gadopentetate dimeglumine (MAGNEVIST) injection 20 mL Intravenous  ? ?REVIEW OF SYSTEMS: ?ROS   ?Positive for: Gastrointestinal, Neurological, Skin, Musculoskeletal, Endocrine, Eyes, Heme/Lymph ?Negative for: Constitutional, Genitourinary, HENT, Cardiovascular, Respiratory, Psychiatric, Allergic/Imm ?Last edited by Theodore Demark, COA on 10/30/2021 10:11 AM.  ?  ? ?ALLERGIES ?Allergies  ?Allergen Reactions  ? Azithromycin Swelling  ? Beef-Derived Products Diarrhea  ?  RED MEAT > UNSPECIFIED REACTION   ? Milk-Related Compounds Diarrhea  ?  DAIRY >UNSPECIFIED REACTION   ? Sulfonamide Derivatives Hives  ? Dilaudid [Hydromorphone Hcl] Nausea And Vomiting  ? ? ?PAST MEDICAL HISTORY ?Past Medical History:  ?Diagnosis Date  ? Adenomatous colon polyp 10/1999  ? Anxiety   ? Arthritis   ? neck, yoga helps.  ? Cataract   ? Complex partial seizure ([place])   ? last seizure was 08-30-2018  ? Crohn's disease of small and large intestines ([place]) 1999  ? Depression   ? Diabetes mellitus   ? no meds at this time 10-24-17  ? Esophageal stricture   ? External hemorrhoids   ? GERD (gastroesophageal reflux disease)   ? Glaucoma   ? History of kidney stones   ? 3 large stones still present  ? Hyperlipemia   ? Hypertension   ? resolved with weight loss   ? Inguinal hernia, bilateral 12/2019  ? Liver cirrhosis secondary to NASH [facility])   ? Migraines   ? Osteoporosis   ? PONV (postoperative nausea and vomiting)   ? PTSD (post-traumatic stress disorder)   ? Renal cyst, acquired, left 07/08/2017  ? Skin cancer   ? squamous cell multiple; [place]; followed every 3 months.  ? Sleep apnea   ? CPAP machine, uses nightly  ? Staphylococcus aureus bacteremia with sepsis ([place]) 05/28/2017  ? MRSA 2012  ? ?Past Surgical History:  ?Procedure Laterality Date  ? CATARACT EXTRACTION Bilateral   ? COLONOSCOPY    ? DEBRIDEMENT MANDIBLE N/A 12/07/2020  ? Procedure: DEBRIDEMENT OF MANDIBLE;  Surgeon: [name], DMD;  Location: [facility];  Service: Oral Surgery;  Laterality: N/A;  ? ELBOW SURGERY  2012  ? elbow MRSA infection   ? EYE SURGERY    ? cataracts removed, /w IOL  ? GROSS TEETH DEBRIDEMENT N/A 12/29/2020  ? Procedure: MANDIBULAR  DEBRIDEMENT;  Surgeon: [name], DMD;  Location: [facility];  Service: Dentistry;  Laterality: N/A;  ? INCISION AND DRAINAGE ABSCESS N/A  12/07/2020  ? Procedure: INCISION AND DRAINAGE MANDIBULAR ABSCESS;  Surgeon: [name], DMD;  Location: [facility];  Service: Oral Surgery;  Laterality: N/A;  ? INGUINAL HERNIA REPAIR Right 01/14/2020  ? Procedure: OPEN RIGHT HERNIA REPAIR INGUINAL WITH MESH;  Surgeon: [name], MD;  Location: [facility];  Service: General;  Laterality: Right;  ? INSERTION OF MESH N/A 07/01/2016  ? Procedure: INSERTION OF MESH;  Surgeon: [name], MD;  Location: [facility];  Service: General;  Laterality: N/A;  ? IR URETERAL STENT LEFT NEW ACCESS W/O SEP NEPHROSTOMY CATH  02/02/2018  ? NEPHROLITHOTOMY Left 02/02/2018  ?  Procedure: NEPHROLITHOTOMY PERCUTANEOUS;  Surgeon: [name], MD;  Location: [facility];  Service: Urology;  Laterality: Left;  ? POLYPECTOMY    ? SCALP LACERATION REPAIR Right 10/16/2017  ? From fall/staples  ? SHOULDER ARTHROSCOPY Right 03/05/2018  ? Procedure: ARTHROSCOPY SHOULDER AND OPEN DISTAL CLAVICLE EXCISION;  Surgeon: [name], MD;  Location: [facility];  Service: Orthopedics;  Laterality: Right;  ? SHOULDER SURGERY    ? SINUS SURGERY WITH INSTATRAK    ? TEE WITHOUT CARDIOVERSION N/A 05/09/2017  ? Procedure: TRANSESOPHAGEAL ECHOCARDIOGRAM (TEE);  Surgeon: [name], MD;  Location: [facility];  Service: Cardiovascular;  Laterality: N/A;  ? TOOTH EXTRACTION N/A 12/07/2020  ? Procedure: DENTAL EXTRACTIONS OF TEETH TWENTY TO THIRTY;  Surgeon: [name], DMD;  Location: [facility];  Service: Oral Surgery;  Laterality: N/A;  ? TOOTH EXTRACTION N/A 12/29/2020  ? Procedure: DENTAL RESTORATION/EXTRACTIONS;  Surgeon: [name], DMD;  Location: [facility];  Service: Dentistry;  Laterality: N/A;  ? TRANSTHORACIC ECHOCARDIOGRAM  02/22/2011  ? EF 55-65%; increased pattern of LVH with mild conc hypertrophy, abnormal relaxation & increased filling pressure (grade 2 diastolic dysfunction); atrial septum thickened (lipomatous hypertrophy)  ? UMBILICAL HERNIA REPAIR N/A 07/01/2016  ? Procedure: UMBILICAL HERNIA  REPAIR WITH MESH;  Surgeon: [name], MD;  Location: [facility];  Service: General;  Laterality: N/A;  ? UPPER GASTROINTESTINAL ENDOSCOPY    ? VASECTOMY    ? ? ?FAMILY HISTORY ?Family History  ?Problem Relatio

## 2021-10-18 DIAGNOSIS — E876 Hypokalemia: Secondary | ICD-10-CM | POA: Diagnosis not present

## 2021-10-18 DIAGNOSIS — R296 Repeated falls: Secondary | ICD-10-CM | POA: Diagnosis not present

## 2021-10-18 DIAGNOSIS — K7460 Unspecified cirrhosis of liver: Secondary | ICD-10-CM | POA: Diagnosis not present

## 2021-10-18 DIAGNOSIS — M810 Age-related osteoporosis without current pathological fracture: Secondary | ICD-10-CM | POA: Diagnosis not present

## 2021-10-18 DIAGNOSIS — D61818 Other pancytopenia: Secondary | ICD-10-CM | POA: Diagnosis not present

## 2021-10-18 DIAGNOSIS — K5090 Crohn's disease, unspecified, without complications: Secondary | ICD-10-CM | POA: Diagnosis not present

## 2021-10-18 DIAGNOSIS — C801 Malignant (primary) neoplasm, unspecified: Secondary | ICD-10-CM | POA: Diagnosis not present

## 2021-10-24 DIAGNOSIS — Z85828 Personal history of other malignant neoplasm of skin: Secondary | ICD-10-CM | POA: Diagnosis not present

## 2021-10-24 DIAGNOSIS — H903 Sensorineural hearing loss, bilateral: Secondary | ICD-10-CM | POA: Diagnosis not present

## 2021-10-24 DIAGNOSIS — C44629 Squamous cell carcinoma of skin of left upper limb, including shoulder: Secondary | ICD-10-CM | POA: Diagnosis not present

## 2021-10-24 DIAGNOSIS — C44329 Squamous cell carcinoma of skin of other parts of face: Secondary | ICD-10-CM | POA: Diagnosis not present

## 2021-10-25 DIAGNOSIS — F332 Major depressive disorder, recurrent severe without psychotic features: Secondary | ICD-10-CM | POA: Diagnosis not present

## 2021-10-25 DIAGNOSIS — F4312 Post-traumatic stress disorder, chronic: Secondary | ICD-10-CM | POA: Diagnosis not present

## 2021-10-25 DIAGNOSIS — F411 Generalized anxiety disorder: Secondary | ICD-10-CM | POA: Diagnosis not present

## 2021-10-29 DIAGNOSIS — H903 Sensorineural hearing loss, bilateral: Secondary | ICD-10-CM | POA: Diagnosis not present

## 2021-10-29 NOTE — Telephone Encounter (Signed)
Staff reminder sent to self to schedule RUQ in 1 year.  ?

## 2021-10-30 ENCOUNTER — Encounter (INDEPENDENT_AMBULATORY_CARE_PROVIDER_SITE_OTHER): Payer: Self-pay | Admitting: Ophthalmology

## 2021-10-30 ENCOUNTER — Ambulatory Visit (INDEPENDENT_AMBULATORY_CARE_PROVIDER_SITE_OTHER): Payer: Medicare Other | Admitting: Ophthalmology

## 2021-10-30 DIAGNOSIS — H3321 Serous retinal detachment, right eye: Secondary | ICD-10-CM

## 2021-10-30 DIAGNOSIS — H401231 Low-tension glaucoma, bilateral, mild stage: Secondary | ICD-10-CM

## 2021-10-30 DIAGNOSIS — H33101 Unspecified retinoschisis, right eye: Secondary | ICD-10-CM | POA: Diagnosis not present

## 2021-10-30 DIAGNOSIS — Z961 Presence of intraocular lens: Secondary | ICD-10-CM

## 2021-10-30 DIAGNOSIS — H3589 Other specified retinal disorders: Secondary | ICD-10-CM | POA: Diagnosis not present

## 2021-10-30 DIAGNOSIS — I10 Essential (primary) hypertension: Secondary | ICD-10-CM | POA: Diagnosis not present

## 2021-10-30 DIAGNOSIS — H35033 Hypertensive retinopathy, bilateral: Secondary | ICD-10-CM

## 2021-10-31 ENCOUNTER — Encounter (INDEPENDENT_AMBULATORY_CARE_PROVIDER_SITE_OTHER): Payer: Self-pay | Admitting: Ophthalmology

## 2021-10-31 DIAGNOSIS — K769 Liver disease, unspecified: Secondary | ICD-10-CM | POA: Diagnosis not present

## 2021-10-31 DIAGNOSIS — J310 Chronic rhinitis: Secondary | ICD-10-CM | POA: Diagnosis not present

## 2021-10-31 DIAGNOSIS — N1330 Unspecified hydronephrosis: Secondary | ICD-10-CM | POA: Diagnosis not present

## 2021-11-09 DIAGNOSIS — K769 Liver disease, unspecified: Secondary | ICD-10-CM | POA: Diagnosis not present

## 2021-11-09 DIAGNOSIS — Z538 Procedure and treatment not carried out for other reasons: Secondary | ICD-10-CM | POA: Diagnosis not present

## 2021-11-09 DIAGNOSIS — R160 Hepatomegaly, not elsewhere classified: Secondary | ICD-10-CM | POA: Diagnosis not present

## 2021-11-09 DIAGNOSIS — Z882 Allergy status to sulfonamides status: Secondary | ICD-10-CM | POA: Diagnosis not present

## 2021-11-09 DIAGNOSIS — C44329 Squamous cell carcinoma of skin of other parts of face: Secondary | ICD-10-CM | POA: Diagnosis not present

## 2021-11-09 DIAGNOSIS — Z8619 Personal history of other infectious and parasitic diseases: Secondary | ICD-10-CM | POA: Diagnosis not present

## 2021-11-09 DIAGNOSIS — F32A Depression, unspecified: Secondary | ICD-10-CM | POA: Diagnosis not present

## 2021-11-09 DIAGNOSIS — E119 Type 2 diabetes mellitus without complications: Secondary | ICD-10-CM | POA: Diagnosis not present

## 2021-11-14 ENCOUNTER — Ambulatory Visit: Payer: Medicare Other | Admitting: Physical Therapy

## 2021-11-14 ENCOUNTER — Telehealth: Payer: Self-pay | Admitting: *Deleted

## 2021-11-14 DIAGNOSIS — R3121 Asymptomatic microscopic hematuria: Secondary | ICD-10-CM | POA: Diagnosis not present

## 2021-11-14 DIAGNOSIS — N130 Hydronephrosis with ureteropelvic junction obstruction: Secondary | ICD-10-CM | POA: Diagnosis not present

## 2021-11-14 NOTE — Telephone Encounter (Signed)
RETURNED PATIENT'S WIFE'S PHONE CALL, SPOKE WITH PATIENT'S WIFE- Ker[place]

## 2021-11-16 ENCOUNTER — Ambulatory Visit: Payer: 59 | Admitting: Radiation Oncology

## 2021-11-16 DIAGNOSIS — K769 Liver disease, unspecified: Secondary | ICD-10-CM | POA: Diagnosis not present

## 2021-11-16 DIAGNOSIS — C221 Intrahepatic bile duct carcinoma: Secondary | ICD-10-CM | POA: Diagnosis not present

## 2021-11-16 DIAGNOSIS — R7989 Other specified abnormal findings of blood chemistry: Secondary | ICD-10-CM | POA: Diagnosis not present

## 2021-11-16 DIAGNOSIS — K7460 Unspecified cirrhosis of liver: Secondary | ICD-10-CM | POA: Diagnosis not present

## 2021-11-16 DIAGNOSIS — K7469 Other cirrhosis of liver: Secondary | ICD-10-CM | POA: Diagnosis not present

## 2021-11-26 DIAGNOSIS — K130 Diseases of lips: Secondary | ICD-10-CM | POA: Diagnosis not present

## 2021-11-26 DIAGNOSIS — D485 Neoplasm of uncertain behavior of skin: Secondary | ICD-10-CM | POA: Diagnosis not present

## 2021-11-26 DIAGNOSIS — Z85828 Personal history of other malignant neoplasm of skin: Secondary | ICD-10-CM | POA: Diagnosis not present

## 2021-11-26 DIAGNOSIS — C44622 Squamous cell carcinoma of skin of right upper limb, including shoulder: Secondary | ICD-10-CM | POA: Diagnosis not present

## 2021-11-26 DIAGNOSIS — Z79899 Other long term (current) drug therapy: Secondary | ICD-10-CM | POA: Diagnosis not present

## 2021-11-26 DIAGNOSIS — C44629 Squamous cell carcinoma of skin of left upper limb, including shoulder: Secondary | ICD-10-CM | POA: Diagnosis not present

## 2021-11-28 DIAGNOSIS — K219 Gastro-esophageal reflux disease without esophagitis: Secondary | ICD-10-CM | POA: Diagnosis not present

## 2021-11-28 DIAGNOSIS — K7689 Other specified diseases of liver: Secondary | ICD-10-CM | POA: Diagnosis not present

## 2021-11-28 DIAGNOSIS — K5090 Crohn's disease, unspecified, without complications: Secondary | ICD-10-CM | POA: Diagnosis not present

## 2021-11-28 DIAGNOSIS — K7460 Unspecified cirrhosis of liver: Secondary | ICD-10-CM | POA: Diagnosis not present

## 2021-11-28 DIAGNOSIS — E119 Type 2 diabetes mellitus without complications: Secondary | ICD-10-CM | POA: Diagnosis not present

## 2021-11-28 DIAGNOSIS — I10 Essential (primary) hypertension: Secondary | ICD-10-CM | POA: Diagnosis not present

## 2021-11-28 DIAGNOSIS — G4733 Obstructive sleep apnea (adult) (pediatric): Secondary | ICD-10-CM | POA: Diagnosis not present

## 2021-11-28 DIAGNOSIS — Z79899 Other long term (current) drug therapy: Secondary | ICD-10-CM | POA: Diagnosis not present

## 2021-11-29 ENCOUNTER — Other Ambulatory Visit: Payer: Self-pay | Admitting: Gastroenterology

## 2021-11-30 DIAGNOSIS — R3121 Asymptomatic microscopic hematuria: Secondary | ICD-10-CM | POA: Diagnosis not present

## 2021-11-30 DIAGNOSIS — R319 Hematuria, unspecified: Secondary | ICD-10-CM | POA: Diagnosis not present

## 2021-11-30 DIAGNOSIS — N1330 Unspecified hydronephrosis: Secondary | ICD-10-CM | POA: Diagnosis not present

## 2021-11-30 DIAGNOSIS — I700 Atherosclerosis of aorta: Secondary | ICD-10-CM | POA: Diagnosis not present

## 2021-12-04 ENCOUNTER — Ambulatory Visit: Payer: Medicare Other | Admitting: Physical Therapy

## 2021-12-07 DIAGNOSIS — N130 Hydronephrosis with ureteropelvic junction obstruction: Secondary | ICD-10-CM | POA: Diagnosis not present

## 2021-12-07 DIAGNOSIS — N201 Calculus of ureter: Secondary | ICD-10-CM | POA: Diagnosis not present

## 2021-12-07 DIAGNOSIS — R1084 Generalized abdominal pain: Secondary | ICD-10-CM | POA: Diagnosis not present

## 2021-12-12 DIAGNOSIS — R197 Diarrhea, unspecified: Secondary | ICD-10-CM | POA: Diagnosis not present

## 2021-12-12 DIAGNOSIS — G40209 Localization-related (focal) (partial) symptomatic epilepsy and epileptic syndromes with complex partial seizures, not intractable, without status epilepticus: Secondary | ICD-10-CM | POA: Diagnosis not present

## 2021-12-12 DIAGNOSIS — G893 Neoplasm related pain (acute) (chronic): Secondary | ICD-10-CM | POA: Diagnosis not present

## 2021-12-12 DIAGNOSIS — Z823 Family history of stroke: Secondary | ICD-10-CM | POA: Diagnosis not present

## 2021-12-12 DIAGNOSIS — K769 Liver disease, unspecified: Secondary | ICD-10-CM | POA: Diagnosis not present

## 2021-12-12 DIAGNOSIS — E119 Type 2 diabetes mellitus without complications: Secondary | ICD-10-CM | POA: Diagnosis not present

## 2021-12-12 DIAGNOSIS — N281 Cyst of kidney, acquired: Secondary | ICD-10-CM | POA: Diagnosis not present

## 2021-12-12 DIAGNOSIS — Z5112 Encounter for antineoplastic immunotherapy: Secondary | ICD-10-CM | POA: Diagnosis not present

## 2021-12-12 DIAGNOSIS — C44329 Squamous cell carcinoma of skin of other parts of face: Secondary | ICD-10-CM | POA: Diagnosis not present

## 2021-12-12 DIAGNOSIS — K5090 Crohn's disease, unspecified, without complications: Secondary | ICD-10-CM | POA: Diagnosis not present

## 2021-12-12 DIAGNOSIS — Z8249 Family history of ischemic heart disease and other diseases of the circulatory system: Secondary | ICD-10-CM | POA: Diagnosis not present

## 2021-12-12 DIAGNOSIS — M810 Age-related osteoporosis without current pathological fracture: Secondary | ICD-10-CM | POA: Diagnosis not present

## 2021-12-12 DIAGNOSIS — Z79899 Other long term (current) drug therapy: Secondary | ICD-10-CM | POA: Diagnosis not present

## 2021-12-12 DIAGNOSIS — Z882 Allergy status to sulfonamides status: Secondary | ICD-10-CM | POA: Diagnosis not present

## 2021-12-14 ENCOUNTER — Ambulatory Visit
Admission: RE | Admit: 2021-12-14 | Discharge: 2021-12-14 | Disposition: A | Discharge status: Home / Self Care | Payer: Medicare Other | Source: Ambulatory Visit | Attending: Radiation Oncology | Admitting: Radiation Oncology

## 2021-12-14 ENCOUNTER — Encounter: Payer: Self-pay | Admitting: Radiation Oncology

## 2021-12-14 ENCOUNTER — Other Ambulatory Visit: Payer: Self-pay

## 2021-12-14 VITALS — BP 136/89 | HR 84 | Temp 97.9°F | Resp 17 | Wt 160.5 lb

## 2021-12-14 DIAGNOSIS — M1612 Unilateral primary osteoarthritis, left hip: Secondary | ICD-10-CM | POA: Diagnosis not present

## 2021-12-14 DIAGNOSIS — R531 Weakness: Secondary | ICD-10-CM | POA: Diagnosis not present

## 2021-12-14 DIAGNOSIS — S72002A Fracture of unspecified part of neck of left femur, initial encounter for closed fracture: Secondary | ICD-10-CM | POA: Diagnosis not present

## 2021-12-14 DIAGNOSIS — I10 Essential (primary) hypertension: Secondary | ICD-10-CM | POA: Diagnosis not present

## 2021-12-14 DIAGNOSIS — Z8249 Family history of ischemic heart disease and other diseases of the circulatory system: Secondary | ICD-10-CM | POA: Diagnosis not present

## 2021-12-14 DIAGNOSIS — S72142A Displaced intertrochanteric fracture of left femur, initial encounter for closed fracture: Secondary | ICD-10-CM | POA: Diagnosis not present

## 2021-12-14 DIAGNOSIS — E861 Hypovolemia: Secondary | ICD-10-CM | POA: Diagnosis present

## 2021-12-14 DIAGNOSIS — Y92008 Other place in unspecified non-institutional (private) residence as the place of occurrence of the external cause: Secondary | ICD-10-CM | POA: Diagnosis not present

## 2021-12-14 DIAGNOSIS — W101XXA Fall (on)(from) sidewalk curb, initial encounter: Secondary | ICD-10-CM | POA: Diagnosis present

## 2021-12-14 DIAGNOSIS — K7460 Unspecified cirrhosis of liver: Secondary | ICD-10-CM | POA: Diagnosis present

## 2021-12-14 DIAGNOSIS — E119 Type 2 diabetes mellitus without complications: Secondary | ICD-10-CM | POA: Diagnosis not present

## 2021-12-14 DIAGNOSIS — D500 Iron deficiency anemia secondary to blood loss (chronic): Secondary | ICD-10-CM | POA: Diagnosis not present

## 2021-12-14 DIAGNOSIS — Z833 Family history of diabetes mellitus: Secondary | ICD-10-CM | POA: Diagnosis not present

## 2021-12-14 DIAGNOSIS — Z85828 Personal history of other malignant neoplasm of skin: Secondary | ICD-10-CM | POA: Diagnosis not present

## 2021-12-14 DIAGNOSIS — Z96642 Presence of left artificial hip joint: Secondary | ICD-10-CM | POA: Diagnosis not present

## 2021-12-14 DIAGNOSIS — R42 Dizziness and giddiness: Secondary | ICD-10-CM | POA: Diagnosis not present

## 2021-12-14 DIAGNOSIS — Z743 Need for continuous supervision: Secondary | ICD-10-CM | POA: Diagnosis not present

## 2021-12-14 DIAGNOSIS — M84459A Pathological fracture, hip, unspecified, initial encounter for fracture: Secondary | ICD-10-CM | POA: Diagnosis not present

## 2021-12-14 DIAGNOSIS — D62 Acute posthemorrhagic anemia: Secondary | ICD-10-CM | POA: Diagnosis not present

## 2021-12-14 DIAGNOSIS — C44320 Squamous cell carcinoma of skin of unspecified parts of face: Secondary | ICD-10-CM

## 2021-12-14 DIAGNOSIS — M25552 Pain in left hip: Secondary | ICD-10-CM | POA: Diagnosis present

## 2021-12-14 DIAGNOSIS — R609 Edema, unspecified: Secondary | ICD-10-CM | POA: Diagnosis not present

## 2021-12-14 DIAGNOSIS — W19XXXA Unspecified fall, initial encounter: Secondary | ICD-10-CM | POA: Diagnosis not present

## 2021-12-14 DIAGNOSIS — Z923 Personal history of irradiation: Secondary | ICD-10-CM | POA: Insufficient documentation

## 2021-12-14 DIAGNOSIS — R160 Hepatomegaly, not elsewhere classified: Secondary | ICD-10-CM | POA: Insufficient documentation

## 2021-12-14 DIAGNOSIS — Z9989 Dependence on other enabling machines and devices: Secondary | ICD-10-CM | POA: Diagnosis not present

## 2021-12-14 DIAGNOSIS — C44329 Squamous cell carcinoma of skin of other parts of face: Secondary | ICD-10-CM | POA: Diagnosis not present

## 2021-12-14 DIAGNOSIS — Z043 Encounter for examination and observation following other accident: Secondary | ICD-10-CM | POA: Diagnosis not present

## 2021-12-14 DIAGNOSIS — R9431 Abnormal electrocardiogram [ECG] [EKG]: Secondary | ICD-10-CM | POA: Diagnosis not present

## 2021-12-14 DIAGNOSIS — F4310 Post-traumatic stress disorder, unspecified: Secondary | ICD-10-CM | POA: Diagnosis present

## 2021-12-14 DIAGNOSIS — F32A Depression, unspecified: Secondary | ICD-10-CM | POA: Diagnosis present

## 2021-12-14 DIAGNOSIS — S72145A Nondisplaced intertrochanteric fracture of left femur, initial encounter for closed fracture: Secondary | ICD-10-CM | POA: Diagnosis not present

## 2021-12-14 DIAGNOSIS — N179 Acute kidney failure, unspecified: Secondary | ICD-10-CM | POA: Diagnosis present

## 2021-12-14 DIAGNOSIS — N400 Enlarged prostate without lower urinary tract symptoms: Secondary | ICD-10-CM | POA: Diagnosis present

## 2021-12-14 DIAGNOSIS — D696 Thrombocytopenia, unspecified: Secondary | ICD-10-CM | POA: Diagnosis not present

## 2021-12-14 DIAGNOSIS — R569 Unspecified convulsions: Secondary | ICD-10-CM | POA: Diagnosis not present

## 2021-12-14 DIAGNOSIS — E785 Hyperlipidemia, unspecified: Secondary | ICD-10-CM | POA: Diagnosis present

## 2021-12-14 DIAGNOSIS — W010XXA Fall on same level from slipping, tripping and stumbling without subsequent striking against object, initial encounter: Secondary | ICD-10-CM | POA: Diagnosis present

## 2021-12-14 DIAGNOSIS — E43 Unspecified severe protein-calorie malnutrition: Secondary | ICD-10-CM | POA: Diagnosis present

## 2021-12-14 DIAGNOSIS — Z823 Family history of stroke: Secondary | ICD-10-CM | POA: Diagnosis not present

## 2021-12-14 DIAGNOSIS — M79609 Pain in unspecified limb: Secondary | ICD-10-CM | POA: Diagnosis not present

## 2021-12-14 DIAGNOSIS — R0781 Pleurodynia: Secondary | ICD-10-CM | POA: Diagnosis not present

## 2021-12-14 DIAGNOSIS — M810 Age-related osteoporosis without current pathological fracture: Secondary | ICD-10-CM | POA: Diagnosis not present

## 2021-12-14 DIAGNOSIS — Z83438 Family history of other disorder of lipoprotein metabolism and other lipidemia: Secondary | ICD-10-CM | POA: Diagnosis not present

## 2021-12-14 DIAGNOSIS — Z6822 Body mass index (BMI) 22.0-22.9, adult: Secondary | ICD-10-CM | POA: Diagnosis not present

## 2021-12-14 DIAGNOSIS — Z9181 History of falling: Secondary | ICD-10-CM | POA: Diagnosis not present

## 2021-12-14 DIAGNOSIS — Z79899 Other long term (current) drug therapy: Secondary | ICD-10-CM | POA: Insufficient documentation

## 2021-12-14 DIAGNOSIS — S79912A Unspecified injury of left hip, initial encounter: Secondary | ICD-10-CM | POA: Diagnosis not present

## 2021-12-14 DIAGNOSIS — K7581 Nonalcoholic steatohepatitis (NASH): Secondary | ICD-10-CM | POA: Diagnosis present

## 2021-12-14 DIAGNOSIS — G4733 Obstructive sleep apnea (adult) (pediatric): Secondary | ICD-10-CM | POA: Diagnosis not present

## 2021-12-14 DIAGNOSIS — G40209 Localization-related (focal) (partial) symptomatic epilepsy and epileptic syndromes with complex partial seizures, not intractable, without status epilepticus: Secondary | ICD-10-CM | POA: Diagnosis present

## 2021-12-14 DIAGNOSIS — S0011XA Contusion of right eyelid and periocular area, initial encounter: Secondary | ICD-10-CM | POA: Diagnosis present

## 2021-12-14 DIAGNOSIS — S2232XA Fracture of one rib, left side, initial encounter for closed fracture: Secondary | ICD-10-CM | POA: Diagnosis not present

## 2021-12-14 DIAGNOSIS — K219 Gastro-esophageal reflux disease without esophagitis: Secondary | ICD-10-CM | POA: Diagnosis present

## 2021-12-14 DIAGNOSIS — Z9889 Other specified postprocedural states: Secondary | ICD-10-CM | POA: Diagnosis not present

## 2021-12-14 DIAGNOSIS — M4802 Spinal stenosis, cervical region: Secondary | ICD-10-CM | POA: Diagnosis not present

## 2021-12-14 NOTE — Progress Notes (Signed)
[name] [name] presents today for follow-up after completing radiation to his right cheek on 07/05/2021  Skin: Reports it has cleared up significantly with Libtayo infusions Pain: Denies any new areas of concern Dermatology F/U: Sees [name] every couple of months  Last ENT visit: 10/31/2021 Saw [name] --Overall, the patient is doing well following the noted procedures. I encouraged him to moisturize with Vaseline or Aquaphor to manage the dryness recognized on endoscopy today.  --He should follow-up with [name] as scheduled. [name] will plan his next set of imaging.  --He will obtain a liver MRI today as previously noted. --I encouraged him to continue to follow-up with Dermatology.  --I will follow-up in 2 months' time.   MedOnc F/U: 12/12/2021 Saw [name] (NP with [name]):  - Completed 2C of cemiplimab; good tolerance except for increased diarrhea most consistent with Crohns (see below) - MRI Brain/CT neck (09/11/2021): treatment response; no new disease - CT Chest (09/11/2021): no evidence of metastatic disease; partially visualized left renal cyst present - Surgical path: pCR. No indication for adjuvant radiation therapy - Additional cSCC have regressed in size - Given propensity to develop cSCC, we previously discussed role of maintenance cemiplimab. Given Crohn's disease decision was made to get every 6 weeks. This is not data driven however risks of worsening Crohn's is real and will severely impact QoL - Discussed with [name] who is in agreement - He has been tolerating Cemip well without diarrhea or other s/s.  - [name] started by [name].   Other issues of note:  Had liver biopsy on 11/28/2021 was inconclusive, so he is scheduled for a repeat biopsy (plus ablation) in the next couple weeks. Wife was recently diagnosised with breasts cancer and currently receiving chemotherapy under care of [name].

## 2021-12-17 ENCOUNTER — Inpatient Hospital Stay (HOSPITAL_COMMUNITY): Payer: Medicare Other

## 2021-12-17 ENCOUNTER — Inpatient Hospital Stay (HOSPITAL_COMMUNITY): Payer: Medicare Other | Admitting: Anesthesiology

## 2021-12-17 ENCOUNTER — Encounter (HOSPITAL_COMMUNITY)
Admission: EM | Disposition: A | Discharge status: Home Health | Payer: Self-pay | Source: Home / Self Care | Attending: Internal Medicine

## 2021-12-17 ENCOUNTER — Encounter (HOSPITAL_COMMUNITY): Payer: Self-pay | Admitting: Family Medicine

## 2021-12-17 ENCOUNTER — Other Ambulatory Visit: Payer: Self-pay

## 2021-12-17 ENCOUNTER — Encounter: Payer: Self-pay | Admitting: Gastroenterology

## 2021-12-17 ENCOUNTER — Emergency Department (HOSPITAL_COMMUNITY): Payer: Medicare Other

## 2021-12-17 ENCOUNTER — Inpatient Hospital Stay (HOSPITAL_COMMUNITY)
Admission: EM | Admit: 2021-12-17 | Discharge: 2021-12-24 | DRG: 480 | Disposition: A | Discharge status: Home Health | Payer: Medicare Other | Attending: Internal Medicine | Admitting: Internal Medicine

## 2021-12-17 DIAGNOSIS — Z9889 Other specified postprocedural states: Secondary | ICD-10-CM | POA: Diagnosis not present

## 2021-12-17 DIAGNOSIS — R569 Unspecified convulsions: Secondary | ICD-10-CM | POA: Diagnosis not present

## 2021-12-17 DIAGNOSIS — W010XXA Fall on same level from slipping, tripping and stumbling without subsequent striking against object, initial encounter: Secondary | ICD-10-CM | POA: Diagnosis present

## 2021-12-17 DIAGNOSIS — I10 Essential (primary) hypertension: Secondary | ICD-10-CM

## 2021-12-17 DIAGNOSIS — M810 Age-related osteoporosis without current pathological fracture: Secondary | ICD-10-CM | POA: Diagnosis not present

## 2021-12-17 DIAGNOSIS — D62 Acute posthemorrhagic anemia: Secondary | ICD-10-CM | POA: Diagnosis not present

## 2021-12-17 DIAGNOSIS — W19XXXA Unspecified fall, initial encounter: Principal | ICD-10-CM

## 2021-12-17 DIAGNOSIS — Z85828 Personal history of other malignant neoplasm of skin: Secondary | ICD-10-CM

## 2021-12-17 DIAGNOSIS — E119 Type 2 diabetes mellitus without complications: Secondary | ICD-10-CM | POA: Diagnosis present

## 2021-12-17 DIAGNOSIS — D696 Thrombocytopenia, unspecified: Secondary | ICD-10-CM

## 2021-12-17 DIAGNOSIS — W101XXA Fall (on)(from) sidewalk curb, initial encounter: Secondary | ICD-10-CM | POA: Diagnosis present

## 2021-12-17 DIAGNOSIS — Y92008 Other place in unspecified non-institutional (private) residence as the place of occurrence of the external cause: Secondary | ICD-10-CM

## 2021-12-17 DIAGNOSIS — N400 Enlarged prostate without lower urinary tract symptoms: Secondary | ICD-10-CM | POA: Diagnosis present

## 2021-12-17 DIAGNOSIS — S72102A Unspecified trochanteric fracture of left femur, initial encounter for closed fracture: Secondary | ICD-10-CM

## 2021-12-17 DIAGNOSIS — R9431 Abnormal electrocardiogram [ECG] [EKG]: Secondary | ICD-10-CM | POA: Diagnosis not present

## 2021-12-17 DIAGNOSIS — Z043 Encounter for examination and observation following other accident: Secondary | ICD-10-CM | POA: Diagnosis not present

## 2021-12-17 DIAGNOSIS — S0011XA Contusion of right eyelid and periocular area, initial encounter: Secondary | ICD-10-CM | POA: Diagnosis present

## 2021-12-17 DIAGNOSIS — G40209 Localization-related (focal) (partial) symptomatic epilepsy and epileptic syndromes with complex partial seizures, not intractable, without status epilepticus: Secondary | ICD-10-CM | POA: Diagnosis present

## 2021-12-17 DIAGNOSIS — R531 Weakness: Secondary | ICD-10-CM | POA: Diagnosis not present

## 2021-12-17 DIAGNOSIS — S72145A Nondisplaced intertrochanteric fracture of left femur, initial encounter for closed fracture: Principal | ICD-10-CM | POA: Diagnosis present

## 2021-12-17 DIAGNOSIS — G4733 Obstructive sleep apnea (adult) (pediatric): Secondary | ICD-10-CM | POA: Diagnosis not present

## 2021-12-17 DIAGNOSIS — E861 Hypovolemia: Secondary | ICD-10-CM | POA: Diagnosis present

## 2021-12-17 DIAGNOSIS — M4802 Spinal stenosis, cervical region: Secondary | ICD-10-CM | POA: Diagnosis present

## 2021-12-17 DIAGNOSIS — Z6822 Body mass index (BMI) 22.0-22.9, adult: Secondary | ICD-10-CM | POA: Diagnosis not present

## 2021-12-17 DIAGNOSIS — Z9181 History of falling: Secondary | ICD-10-CM | POA: Diagnosis not present

## 2021-12-17 DIAGNOSIS — K5080 Crohn's disease of both small and large intestine without complications: Secondary | ICD-10-CM | POA: Diagnosis present

## 2021-12-17 DIAGNOSIS — E43 Unspecified severe protein-calorie malnutrition: Secondary | ICD-10-CM | POA: Insufficient documentation

## 2021-12-17 DIAGNOSIS — Z83438 Family history of other disorder of lipoprotein metabolism and other lipidemia: Secondary | ICD-10-CM

## 2021-12-17 DIAGNOSIS — E785 Hyperlipidemia, unspecified: Secondary | ICD-10-CM | POA: Diagnosis present

## 2021-12-17 DIAGNOSIS — M84459A Pathological fracture, hip, unspecified, initial encounter for fracture: Secondary | ICD-10-CM | POA: Diagnosis not present

## 2021-12-17 DIAGNOSIS — S2232XA Fracture of one rib, left side, initial encounter for closed fracture: Secondary | ICD-10-CM | POA: Diagnosis present

## 2021-12-17 DIAGNOSIS — C44329 Squamous cell carcinoma of skin of other parts of face: Secondary | ICD-10-CM | POA: Diagnosis present

## 2021-12-17 DIAGNOSIS — F4310 Post-traumatic stress disorder, unspecified: Secondary | ICD-10-CM | POA: Diagnosis present

## 2021-12-17 DIAGNOSIS — Z8249 Family history of ischemic heart disease and other diseases of the circulatory system: Secondary | ICD-10-CM | POA: Diagnosis not present

## 2021-12-17 DIAGNOSIS — F32A Depression, unspecified: Secondary | ICD-10-CM | POA: Diagnosis present

## 2021-12-17 DIAGNOSIS — Z833 Family history of diabetes mellitus: Secondary | ICD-10-CM | POA: Diagnosis not present

## 2021-12-17 DIAGNOSIS — N179 Acute kidney failure, unspecified: Secondary | ICD-10-CM | POA: Diagnosis present

## 2021-12-17 DIAGNOSIS — K7460 Unspecified cirrhosis of liver: Secondary | ICD-10-CM | POA: Diagnosis present

## 2021-12-17 DIAGNOSIS — M80052A Age-related osteoporosis with current pathological fracture, left femur, initial encounter for fracture: Secondary | ICD-10-CM | POA: Diagnosis present

## 2021-12-17 DIAGNOSIS — S72142A Displaced intertrochanteric fracture of left femur, initial encounter for closed fracture: Secondary | ICD-10-CM | POA: Diagnosis not present

## 2021-12-17 DIAGNOSIS — K7581 Nonalcoholic steatohepatitis (NASH): Secondary | ICD-10-CM | POA: Diagnosis present

## 2021-12-17 DIAGNOSIS — D500 Iron deficiency anemia secondary to blood loss (chronic): Secondary | ICD-10-CM | POA: Diagnosis present

## 2021-12-17 DIAGNOSIS — Z96642 Presence of left artificial hip joint: Secondary | ICD-10-CM | POA: Diagnosis not present

## 2021-12-17 DIAGNOSIS — Z823 Family history of stroke: Secondary | ICD-10-CM

## 2021-12-17 DIAGNOSIS — N200 Calculus of kidney: Secondary | ICD-10-CM

## 2021-12-17 DIAGNOSIS — S79912A Unspecified injury of left hip, initial encounter: Secondary | ICD-10-CM | POA: Diagnosis not present

## 2021-12-17 DIAGNOSIS — R609 Edema, unspecified: Secondary | ICD-10-CM | POA: Diagnosis not present

## 2021-12-17 DIAGNOSIS — S2239XA Fracture of one rib, unspecified side, initial encounter for closed fracture: Secondary | ICD-10-CM

## 2021-12-17 DIAGNOSIS — Z9989 Dependence on other enabling machines and devices: Secondary | ICD-10-CM

## 2021-12-17 DIAGNOSIS — K219 Gastro-esophageal reflux disease without esophagitis: Secondary | ICD-10-CM | POA: Diagnosis present

## 2021-12-17 DIAGNOSIS — Z923 Personal history of irradiation: Secondary | ICD-10-CM

## 2021-12-17 DIAGNOSIS — R42 Dizziness and giddiness: Secondary | ICD-10-CM | POA: Diagnosis not present

## 2021-12-17 DIAGNOSIS — M1612 Unilateral primary osteoarthritis, left hip: Secondary | ICD-10-CM | POA: Diagnosis not present

## 2021-12-17 DIAGNOSIS — M79609 Pain in unspecified limb: Secondary | ICD-10-CM | POA: Diagnosis not present

## 2021-12-17 DIAGNOSIS — R0781 Pleurodynia: Secondary | ICD-10-CM | POA: Diagnosis not present

## 2021-12-17 DIAGNOSIS — M25552 Pain in left hip: Secondary | ICD-10-CM | POA: Diagnosis present

## 2021-12-17 DIAGNOSIS — Z743 Need for continuous supervision: Secondary | ICD-10-CM | POA: Diagnosis not present

## 2021-12-17 DIAGNOSIS — S72002A Fracture of unspecified part of neck of left femur, initial encounter for closed fracture: Secondary | ICD-10-CM | POA: Diagnosis not present

## 2021-12-17 HISTORY — PX: INTRAMEDULLARY (IM) NAIL INTERTROCHANTERIC: SHX5875

## 2021-12-17 LAB — BASIC METABOLIC PANEL
Anion gap: 8 (ref 5–15)
BUN: 14 mg/dL (ref 6–20)
CO2: 22 mmol/L (ref 22–32)
Calcium: 9.3 mg/dL (ref 8.9–10.3)
Chloride: 113 mmol/L — ABNORMAL HIGH (ref 98–111)
Creatinine, Ser: 1.19 mg/dL (ref 0.61–1.24)
GFR, Estimated: >60 mL/min (ref >60)
Glucose, Bld: 83 mg/dL (ref 70–99)
Potassium: 3.7 mmol/L (ref 3.5–5.1)
Sodium: 143 mmol/L (ref 135–145)

## 2021-12-17 LAB — CBC WITH DIFFERENTIAL/PLATELET
Abs Immature Granulocytes: 0.02 10*3/uL (ref 0.00–0.07)
Basophils Absolute: 0 10*3/uL (ref 0.0–0.1)
Basophils Relative: 1 %
Eosinophils Absolute: 0.2 10*3/uL (ref 0.0–0.5)
Eosinophils Relative: 4 %
HCT: 32.6 % — ABNORMAL LOW (ref 39.0–52.0)
Hemoglobin: 11 g/dL — ABNORMAL LOW (ref 13.0–17.0)
Immature Granulocytes: 0 %
Lymphocytes Relative: 11 %
Lymphs Abs: 0.5 10*3/uL — ABNORMAL LOW (ref 0.7–4.0)
MCH: 32.1 pg (ref 26.0–34.0)
MCHC: 33.7 g/dL (ref 30.0–36.0)
MCV: 95 fL (ref 80.0–100.0)
Monocytes Absolute: 0.5 10*3/uL (ref 0.1–1.0)
Monocytes Relative: 9 %
Neutro Abs: 3.8 10*3/uL (ref 1.7–7.7)
Neutrophils Relative %: 75 %
Platelets: UNDETERMINED 10*3/uL (ref 150–400)
RBC: 3.43 MIL/uL — ABNORMAL LOW (ref 4.22–5.81)
RDW: 14.2 % (ref 11.5–15.5)
WBC: 5.1 10*3/uL (ref 4.0–10.5)
nRBC: 0 % (ref 0.0–0.2)

## 2021-12-17 LAB — URINALYSIS, ROUTINE W REFLEX MICROSCOPIC
Bilirubin Urine: NEGATIVE
Glucose, UA: NEGATIVE mg/dL
Ketones, ur: NEGATIVE mg/dL
Leukocytes,Ua: NEGATIVE
Nitrite: NEGATIVE
Protein, ur: 100 mg/dL — AB
RBC / HPF: >50 RBC/hpf — ABNORMAL HIGH (ref 0–5)
Specific Gravity, Urine: 1.015 (ref 1.005–1.030)
pH: 5 (ref 5.0–8.0)

## 2021-12-17 LAB — HEPATIC FUNCTION PANEL
ALT: 19 U/L (ref 0–44)
AST: 30 U/L (ref 15–41)
Albumin: 3.6 g/dL (ref 3.5–5.0)
Alkaline Phosphatase: 126 U/L (ref 38–126)
Bilirubin, Direct: 0.3 mg/dL — ABNORMAL HIGH (ref 0.0–0.2)
Indirect Bilirubin: 1.5 mg/dL — ABNORMAL HIGH (ref 0.3–0.9)
Total Bilirubin: 1.8 mg/dL — ABNORMAL HIGH (ref 0.3–1.2)
Total Protein: 6 g/dL — ABNORMAL LOW (ref 6.5–8.1)

## 2021-12-17 LAB — PROTIME-INR
INR: 1.3 — ABNORMAL HIGH (ref 0.8–1.2)
Prothrombin Time: 16.5 seconds — ABNORMAL HIGH (ref 11.4–15.2)

## 2021-12-17 LAB — GLUCOSE, CAPILLARY: Glucose-Capillary: 109 mg/dL — ABNORMAL HIGH (ref 70–99)

## 2021-12-17 LAB — ABO/RH: ABO/RH(D): O POS

## 2021-12-17 LAB — CBG MONITORING, ED: Glucose-Capillary: 96 mg/dL (ref 70–99)

## 2021-12-17 SURGERY — FIXATION, FRACTURE, INTERTROCHANTERIC, WITH INTRAMEDULLARY ROD
Anesthesia: General | Site: Hip | Laterality: Left

## 2021-12-17 MED ORDER — ENSURE ENLIVE PO LIQD
237.0000 mL | Freq: Two times a day (BID) | ORAL | Status: DC
  Administered 2021-12-18: 237 mL via ORAL

## 2021-12-17 MED ORDER — CHLORHEXIDINE GLUCONATE 4 % EX LIQD: 60.0000 mL | Freq: Once | CUTANEOUS | Status: DC

## 2021-12-17 MED ORDER — HYDROCODONE-ACETAMINOPHEN 5-325 MG PO TABS
ORAL_TABLET | ORAL | Status: AC
  Filled 2021-12-17: qty 2

## 2021-12-17 MED ORDER — TRAZODONE HCL 50 MG PO TABS
150.0000 mg | ORAL_TABLET | Freq: Every day | ORAL | Status: DC
  Administered 2021-12-17 – 2021-12-23 (×7): 150 mg via ORAL
  Filled 2021-12-17 (×7): qty 3

## 2021-12-17 MED ORDER — PROMETHAZINE HCL 25 MG/ML IJ SOLN: 6.2500 mg | INTRAMUSCULAR | Status: DC | PRN

## 2021-12-17 MED ORDER — MIDAZOLAM HCL 2 MG/2ML IJ SOLN
INTRAMUSCULAR | Status: AC
  Filled 2021-12-17: qty 2

## 2021-12-17 MED ORDER — MUPIROCIN 2 % EX OINT
1.0000 | TOPICAL_OINTMENT | Freq: Three times a day (TID) | CUTANEOUS | Status: DC
Start: 2021-12-17 — End: 2021-12-24
  Administered 2021-12-17 – 2021-12-24 (×19): 1 via TOPICAL
  Filled 2021-12-17 (×3): qty 22

## 2021-12-17 MED ORDER — FENTANYL CITRATE (PF) 100 MCG/2ML IJ SOLN
25.0000 ug | INTRAMUSCULAR | Status: DC | PRN
  Administered 2021-12-17 (×2): 50 ug via INTRAVENOUS

## 2021-12-17 MED ORDER — 0.9 % SODIUM CHLORIDE (POUR BTL) OPTIME
TOPICAL | Status: DC | PRN
  Administered 2021-12-17: 1000 mL

## 2021-12-17 MED ORDER — METOCLOPRAMIDE HCL 5 MG/ML IJ SOLN: 5.0000 mg | Freq: Three times a day (TID) | INTRAMUSCULAR | Status: DC | PRN

## 2021-12-17 MED ORDER — POVIDONE-IODINE 10 % EX SWAB
2.0000 | Freq: Once | CUTANEOUS | Status: AC
  Administered 2021-12-17: 2 via TOPICAL

## 2021-12-17 MED ORDER — ONDANSETRON HCL 4 MG/2ML IJ SOLN: 4.0000 mg | Freq: Four times a day (QID) | INTRAMUSCULAR | Status: DC | PRN

## 2021-12-17 MED ORDER — ACETAMINOPHEN 325 MG PO TABS
650.0000 mg | ORAL_TABLET | Freq: Four times a day (QID) | ORAL | Status: DC | PRN
Start: 2021-12-17 — End: 2021-12-18

## 2021-12-17 MED ORDER — DOCUSATE SODIUM 100 MG PO CAPS
100.0000 mg | ORAL_CAPSULE | Freq: Two times a day (BID) | ORAL | Status: DC
  Administered 2021-12-17 – 2021-12-24 (×10): 100 mg via ORAL
  Filled 2021-12-17 (×13): qty 1

## 2021-12-17 MED ORDER — PHENOL 1.4 % MT LIQD: 1.0000 | OROMUCOSAL | Status: DC | PRN

## 2021-12-17 MED ORDER — PHENYLEPHRINE 80 MCG/ML (10ML) SYRINGE FOR IV PUSH (FOR BLOOD PRESSURE SUPPORT)
PREFILLED_SYRINGE | INTRAVENOUS | Status: DC | PRN
  Administered 2021-12-17: 160 ug via INTRAVENOUS

## 2021-12-17 MED ORDER — FENTANYL CITRATE (PF) 250 MCG/5ML IJ SOLN
INTRAMUSCULAR | Status: AC
  Filled 2021-12-17: qty 5

## 2021-12-17 MED ORDER — FENTANYL CITRATE (PF) 100 MCG/2ML IJ SOLN
INTRAMUSCULAR | Status: AC
  Filled 2021-12-17: qty 2

## 2021-12-17 MED ORDER — LAMOTRIGINE 100 MG PO TABS
150.0000 mg | ORAL_TABLET | Freq: Two times a day (BID) | ORAL | Status: DC
  Administered 2021-12-17 – 2021-12-24 (×14): 150 mg via ORAL
  Filled 2021-12-17 (×14): qty 2

## 2021-12-17 MED ORDER — LACTATED RINGERS IV SOLN: INTRAVENOUS | Status: DC

## 2021-12-17 MED ORDER — MORPHINE SULFATE (PF) 2 MG/ML IV SOLN
1.0000 mg | INTRAVENOUS | Status: DC | PRN
  Administered 2021-12-17 – 2021-12-19 (×4): 1 mg via INTRAVENOUS
  Filled 2021-12-17 (×4): qty 1

## 2021-12-17 MED ORDER — PROPOFOL 10 MG/ML IV BOLUS
INTRAVENOUS | Status: DC | PRN
  Administered 2021-12-17: 120 mg via INTRAVENOUS

## 2021-12-17 MED ORDER — FENTANYL CITRATE PF 50 MCG/ML IJ SOSY
50.0000 ug | PREFILLED_SYRINGE | Freq: Once | INTRAMUSCULAR | Status: AC
  Administered 2021-12-17: 50 ug via INTRAVENOUS
  Filled 2021-12-17: qty 1

## 2021-12-17 MED ORDER — AMISULPRIDE (ANTIEMETIC) 5 MG/2ML IV SOLN: 10.0000 mg | Freq: Once | INTRAVENOUS | Status: DC | PRN

## 2021-12-17 MED ORDER — PANTOPRAZOLE SODIUM 40 MG PO TBEC
40.0000 mg | DELAYED_RELEASE_TABLET | Freq: Two times a day (BID) | ORAL | Status: DC
  Administered 2021-12-18 – 2021-12-24 (×13): 40 mg via ORAL
  Filled 2021-12-17 (×13): qty 1

## 2021-12-17 MED ORDER — ONDANSETRON HCL 4 MG/2ML IJ SOLN
INTRAMUSCULAR | Status: DC | PRN
  Administered 2021-12-17: 4 mg via INTRAVENOUS

## 2021-12-17 MED ORDER — METHOCARBAMOL 500 MG PO TABS: 500.0000 mg | ORAL_TABLET | Freq: Four times a day (QID) | ORAL | Status: DC | PRN

## 2021-12-17 MED ORDER — METHOCARBAMOL 1000 MG/10ML IJ SOLN: 500.0000 mg | Freq: Four times a day (QID) | INTRAVENOUS | Status: DC | PRN

## 2021-12-17 MED ORDER — ONDANSETRON HCL 4 MG PO TABS: 4.0000 mg | ORAL_TABLET | Freq: Four times a day (QID) | ORAL | Status: DC | PRN

## 2021-12-17 MED ORDER — MESALAMINE 1.2 G PO TBEC
2.4000 g | DELAYED_RELEASE_TABLET | Freq: Every day | ORAL | Status: DC
  Administered 2021-12-17 – 2021-12-24 (×8): 2.4 g via ORAL
  Filled 2021-12-17 (×8): qty 2

## 2021-12-17 MED ORDER — MESALAMINE 1.2 G PO TBEC: 2.4000 g | DELAYED_RELEASE_TABLET | Freq: Two times a day (BID) | ORAL | 1 refills | Status: DC

## 2021-12-17 MED ORDER — CYCLOSPORINE 0.05 % OP EMUL
1.0000 [drp] | Freq: Two times a day (BID) | OPHTHALMIC | Status: DC | PRN
Start: 2021-12-17 — End: 2021-12-24

## 2021-12-17 MED ORDER — ENOXAPARIN SODIUM 40 MG/0.4ML IJ SOSY
40.0000 mg | PREFILLED_SYRINGE | INTRAMUSCULAR | Status: DC
  Administered 2021-12-18 – 2021-12-19 (×2): 40 mg via SUBCUTANEOUS
  Filled 2021-12-17 (×2): qty 0.4

## 2021-12-17 MED ORDER — FOLIC ACID 1 MG PO TABS
2.0000 mg | ORAL_TABLET | Freq: Every day | ORAL | Status: DC
  Administered 2021-12-17 – 2021-12-24 (×8): 2 mg via ORAL
  Filled 2021-12-17 (×8): qty 2

## 2021-12-17 MED ORDER — RIFAXIMIN 550 MG PO TABS
550.0000 mg | ORAL_TABLET | Freq: Two times a day (BID) | ORAL | Status: DC
  Administered 2021-12-17 – 2021-12-24 (×14): 550 mg via ORAL
  Filled 2021-12-17 (×15): qty 1

## 2021-12-17 MED ORDER — CHLORHEXIDINE GLUCONATE 0.12 % MT SOLN: 15.0000 mL | Freq: Once | OROMUCOSAL | Status: AC

## 2021-12-17 MED ORDER — HYDROCODONE-ACETAMINOPHEN 5-325 MG PO TABS: 1.0000 | ORAL_TABLET | Freq: Four times a day (QID) | ORAL | Status: DC | PRN

## 2021-12-17 MED ORDER — ACITRETIN 25 MG PO CAPS
25.0000 mg | ORAL_CAPSULE | Freq: Every day | ORAL | Status: DC
  Administered 2021-12-18 – 2021-12-24 (×7): 25 mg via ORAL
  Filled 2021-12-17 (×8): qty 1

## 2021-12-17 MED ORDER — CEFAZOLIN SODIUM-DEXTROSE 2-4 GM/100ML-% IV SOLN
2.0000 g | Freq: Four times a day (QID) | INTRAVENOUS | Status: AC
  Administered 2021-12-17 – 2021-12-18 (×2): 2 g via INTRAVENOUS
  Filled 2021-12-17 (×2): qty 100

## 2021-12-17 MED ORDER — HYDROCODONE-ACETAMINOPHEN 5-325 MG PO TABS
1.0000 | ORAL_TABLET | Freq: Once | ORAL | Status: AC
  Administered 2021-12-17: 2 via ORAL

## 2021-12-17 MED ORDER — CHLORHEXIDINE GLUCONATE 0.12 % MT SOLN
OROMUCOSAL | Status: AC
  Administered 2021-12-17: 15 mL via OROMUCOSAL
  Filled 2021-12-17: qty 15

## 2021-12-17 MED ORDER — METOCLOPRAMIDE HCL 5 MG PO TABS: 5.0000 mg | ORAL_TABLET | Freq: Three times a day (TID) | ORAL | Status: DC | PRN

## 2021-12-17 MED ORDER — HYDROCORTISONE 1 % EX CREA: 1.0000 | TOPICAL_CREAM | Freq: Two times a day (BID) | CUTANEOUS | Status: DC | PRN

## 2021-12-17 MED ORDER — METHSCOPOLAMINE BROMIDE 5 MG PO TABS
5.0000 mg | ORAL_TABLET | Freq: Two times a day (BID) | ORAL | 1 refills | Status: DC
Start: 2021-12-17 — End: 2022-05-30

## 2021-12-17 MED ORDER — ZOLPIDEM TARTRATE 5 MG PO TABS
5.0000 mg | ORAL_TABLET | Freq: Every evening | ORAL | Status: DC | PRN
  Administered 2021-12-18 – 2021-12-20 (×2): 5 mg via ORAL
  Filled 2021-12-17 (×2): qty 1

## 2021-12-17 MED ORDER — SUGAMMADEX SODIUM 200 MG/2ML IV SOLN
INTRAVENOUS | Status: DC | PRN
  Administered 2021-12-17: 200 mg via INTRAVENOUS

## 2021-12-17 MED ORDER — TAMSULOSIN HCL 0.4 MG PO CAPS
0.4000 mg | ORAL_CAPSULE | Freq: Every day | ORAL | Status: DC
  Administered 2021-12-17 – 2021-12-23 (×7): 0.4 mg via ORAL
  Filled 2021-12-17 (×7): qty 1

## 2021-12-17 MED ORDER — LIDOCAINE 2% (20 MG/ML) 5 ML SYRINGE
INTRAMUSCULAR | Status: DC | PRN
  Administered 2021-12-17: 100 mg via INTRAVENOUS

## 2021-12-17 MED ORDER — INSULIN ASPART 100 UNIT/ML IJ SOLN
0.0000 [IU] | Freq: Three times a day (TID) | INTRAMUSCULAR | Status: DC
  Administered 2021-12-18 – 2021-12-21 (×3): 1 [IU] via SUBCUTANEOUS

## 2021-12-17 MED ORDER — METHSCOPOLAMINE BROMIDE 5 MG PO TABS
5.0000 mg | ORAL_TABLET | Freq: Two times a day (BID) | ORAL | Status: DC
  Administered 2021-12-18 – 2021-12-24 (×13): 5 mg via ORAL
  Filled 2021-12-17 (×15): qty 1

## 2021-12-17 MED ORDER — POTASSIUM CHLORIDE CRYS ER 20 MEQ PO TBCR
20.0000 meq | EXTENDED_RELEASE_TABLET | Freq: Two times a day (BID) | ORAL | Status: DC
  Administered 2021-12-17 – 2021-12-24 (×14): 20 meq via ORAL
  Filled 2021-12-17 (×14): qty 1

## 2021-12-17 MED ORDER — MORPHINE SULFATE (PF) 4 MG/ML IV SOLN
4.0000 mg | Freq: Once | INTRAVENOUS | Status: AC
  Administered 2021-12-17: 4 mg via INTRAVENOUS
  Filled 2021-12-17: qty 1

## 2021-12-17 MED ORDER — TRANEXAMIC ACID-NACL 1000-0.7 MG/100ML-% IV SOLN
1000.0000 mg | Freq: Once | INTRAVENOUS | Status: AC
  Administered 2021-12-18: 1000 mg via INTRAVENOUS
  Filled 2021-12-17: qty 100

## 2021-12-17 MED ORDER — LATANOPROST 0.005 % OP SOLN
1.0000 [drp] | Freq: Every day | OPHTHALMIC | Status: DC
Start: 2021-12-17 — End: 2021-12-24
  Administered 2021-12-17 – 2021-12-23 (×7): 1 [drp] via OPHTHALMIC
  Filled 2021-12-17: qty 2.5

## 2021-12-17 MED ORDER — CEFAZOLIN SODIUM-DEXTROSE 2-4 GM/100ML-% IV SOLN
2.0000 g | INTRAVENOUS | Status: AC
  Administered 2021-12-17: 2 g via INTRAVENOUS

## 2021-12-17 MED ORDER — ORAL CARE MOUTH RINSE: 15.0000 mL | Freq: Once | OROMUCOSAL | Status: AC

## 2021-12-17 MED ORDER — MENTHOL 3 MG MT LOZG: 1.0000 | LOZENGE | OROMUCOSAL | Status: DC | PRN

## 2021-12-17 MED ORDER — FENTANYL CITRATE (PF) 250 MCG/5ML IJ SOLN
INTRAMUSCULAR | Status: DC | PRN
Start: 2021-12-17 — End: 2021-12-17
  Administered 2021-12-17 (×2): 25 ug via INTRAVENOUS
  Administered 2021-12-17: 50 ug via INTRAVENOUS
  Administered 2021-12-17 (×4): 25 ug via INTRAVENOUS
  Administered 2021-12-17: 50 ug via INTRAVENOUS

## 2021-12-17 MED ORDER — DEXAMETHASONE SODIUM PHOSPHATE 10 MG/ML IJ SOLN
INTRAMUSCULAR | Status: DC | PRN
  Administered 2021-12-17: 10 mg via INTRAVENOUS

## 2021-12-17 MED ORDER — HYDROCODONE-ACETAMINOPHEN 5-325 MG PO TABS
1.0000 | ORAL_TABLET | Freq: Four times a day (QID) | ORAL | Status: DC | PRN
  Administered 2021-12-18: 2 via ORAL
  Filled 2021-12-17: qty 2

## 2021-12-17 MED ORDER — SERTRALINE HCL 100 MG PO TABS
200.0000 mg | ORAL_TABLET | Freq: Every day | ORAL | Status: DC
  Administered 2021-12-18 – 2021-12-24 (×7): 200 mg via ORAL
  Filled 2021-12-17 (×7): qty 2

## 2021-12-17 MED ORDER — LIDOCAINE 5 % EX PTCH
1.0000 | MEDICATED_PATCH | CUTANEOUS | Status: DC
  Administered 2021-12-17 – 2021-12-22 (×6): 1 via TRANSDERMAL
  Filled 2021-12-17 (×7): qty 1

## 2021-12-17 MED ORDER — FESOTERODINE FUMARATE ER 8 MG PO TB24
8.0000 mg | ORAL_TABLET | Freq: Every day | ORAL | Status: DC
  Administered 2021-12-17 – 2021-12-24 (×8): 8 mg via ORAL
  Filled 2021-12-17 (×8): qty 1

## 2021-12-17 MED ORDER — ALBUMIN HUMAN 5 % IV SOLN: INTRAVENOUS | Status: DC | PRN

## 2021-12-17 MED ORDER — ACITRETIN 25 MG PO CAPS: 25.0000 mg | ORAL_CAPSULE | Freq: Every day | ORAL | Status: DC

## 2021-12-17 MED ORDER — LACTASE 3000 UNITS PO TABS
3000.0000 [IU] | ORAL_TABLET | Freq: Two times a day (BID) | ORAL | Status: DC
  Administered 2021-12-17 – 2021-12-24 (×14): 3000 [IU] via ORAL
  Filled 2021-12-17 (×15): qty 1

## 2021-12-17 MED ORDER — ROCURONIUM BROMIDE 10 MG/ML (PF) SYRINGE
PREFILLED_SYRINGE | INTRAVENOUS | Status: DC | PRN
  Administered 2021-12-17: 60 mg via INTRAVENOUS

## 2021-12-17 SURGICAL SUPPLY — 55 items
BAG COUNTER SPONGE SURGICOUNT (BAG) ×3 IMPLANT
BAG SPNG CNTER NS LX DISP (BAG) ×1
BIT DRILL CANN LG 4.3MM (BIT) IMPLANT
BNDG COHESIVE 6X5 TAN STRL LF (GAUZE/BANDAGES/DRESSINGS) IMPLANT
BRUSH SCRUB EZ PLAIN DRY (MISCELLANEOUS) ×6 IMPLANT
COVER PERINEAL POST (MISCELLANEOUS) ×3 IMPLANT
COVER SURGICAL LIGHT HANDLE (MISCELLANEOUS) ×6 IMPLANT
DRAPE C-ARMOR (DRAPES) ×3 IMPLANT
DRAPE HALF SHEET 40X57 (DRAPES) ×3 IMPLANT
DRAPE INCISE IOBAN 66X45 STRL (DRAPES) ×3 IMPLANT
DRAPE ORTHO SPLIT 77X108 STRL (DRAPES) ×4
DRAPE SURG ORHT 6 SPLT 77X108 (DRAPES) IMPLANT
DRAPE U-SHAPE 47X51 STRL (DRAPES) ×3 IMPLANT
DRESSING MEPILEX FLEX 4X4 (GAUZE/BANDAGES/DRESSINGS) IMPLANT
DRILL BIT CANN LG 4.3MM (BIT) ×2
DRSG EMULSION OIL 3X3 NADH (GAUZE/BANDAGES/DRESSINGS) ×2 IMPLANT
DRSG MEPILEX BORDER 4X4 (GAUZE/BANDAGES/DRESSINGS) ×2 IMPLANT
DRSG MEPILEX BORDER 4X8 (GAUZE/BANDAGES/DRESSINGS) ×3 IMPLANT
DRSG MEPILEX FLEX 4X4 (GAUZE/BANDAGES/DRESSINGS) ×2
ELECT REM PT RETURN 9FT ADLT (ELECTROSURGICAL) ×2
ELECTRODE REM PT RTRN 9FT ADLT (ELECTROSURGICAL) ×2 IMPLANT
GLOVE BIO SURGEON STRL SZ7.5 (GLOVE) ×3 IMPLANT
GLOVE BIO SURGEON STRL SZ8 (GLOVE) ×3 IMPLANT
GLOVE BIOGEL PI IND STRL 7.5 (GLOVE) ×2 IMPLANT
GLOVE BIOGEL PI IND STRL 8 (GLOVE) ×2 IMPLANT
GLOVE BIOGEL PI INDICATOR 7.5 (GLOVE) ×1
GLOVE BIOGEL PI INDICATOR 8 (GLOVE) ×1
GLOVE SURG ORTHO LTX SZ7.5 (GLOVE) ×6 IMPLANT
GOWN STRL REUS W/ TWL LRG LVL3 (GOWN DISPOSABLE) ×4 IMPLANT
GOWN STRL REUS W/ TWL XL LVL3 (GOWN DISPOSABLE) ×2 IMPLANT
GOWN STRL REUS W/TWL LRG LVL3 (GOWN DISPOSABLE) ×4
GOWN STRL REUS W/TWL XL LVL3 (GOWN DISPOSABLE) ×2
GUIDEPIN VERSANAIL DSP 3.2X444 (ORTHOPEDIC DISPOSABLE SUPPLIES) ×2 IMPLANT
HIP FRA NAIL LAG SCREW 10.5X90 (Orthopedic Implant) ×2 IMPLANT
KIT BASIN OR (CUSTOM PROCEDURE TRAY) ×3 IMPLANT
KIT TURNOVER KIT B (KITS) ×3 IMPLANT
NAIL HIP FRACT 130D 11X180 (Screw) ×1 IMPLANT
NS IRRIG 1000ML POUR BTL (IV SOLUTION) ×3 IMPLANT
PACK GENERAL/GYN (CUSTOM PROCEDURE TRAY) ×3 IMPLANT
PAD ARMBOARD 7.5X6 YLW CONV (MISCELLANEOUS) ×6 IMPLANT
SCREW BONE CORTICAL 5.0X3 (Screw) ×1 IMPLANT
SCREW LAG HIP FRA NAIL 10.5X90 (Orthopedic Implant) IMPLANT
SPONGE T-LAP 18X18 ~~LOC~~+RFID (SPONGE) IMPLANT
STAPLER VISISTAT 35W (STAPLE) ×3 IMPLANT
STOCKINETTE IMPERVIOUS LG (DRAPES) ×1 IMPLANT
SUT ETHILON 2 0 FS 18 (SUTURE) ×3 IMPLANT
SUT VIC AB 0 CT1 27 (SUTURE) ×2
SUT VIC AB 0 CT1 27XBRD ANBCTR (SUTURE) ×2 IMPLANT
SUT VIC AB 1 CT1 27 (SUTURE) ×2
SUT VIC AB 1 CT1 27XBRD ANBCTR (SUTURE) ×2 IMPLANT
SUT VIC AB 2-0 CT1 27 (SUTURE) ×2
SUT VIC AB 2-0 CT1 TAPERPNT 27 (SUTURE) ×2 IMPLANT
TOWEL GREEN STERILE (TOWEL DISPOSABLE) ×6 IMPLANT
TOWEL GREEN STERILE FF (TOWEL DISPOSABLE) ×3 IMPLANT
WATER STERILE IRR 1000ML POUR (IV SOLUTION) ×2 IMPLANT

## 2021-12-17 NOTE — ED Notes (Signed)
Patient on the monitor did ekg shown to Dr Freida Busman checked patient blood sugar it was 96 notified the RN and PA of blood sugar patient is resting with call bell in reach

## 2021-12-17 NOTE — Assessment & Plan Note (Addendum)
No evidence of flair at this time, but had diarrhea on Friday and could be from his infusion.  No bloody diarrhea.  continue medication

## 2021-12-17 NOTE — Assessment & Plan Note (Signed)
Stable, continue to monitor History of thrombocytopenia likely from cirrhosis and chemo Monitor closely

## 2021-12-17 NOTE — Assessment & Plan Note (Signed)
Followed by urology, has 3 stones per wife +hematuria in urine He complains of increasing dysuria, will check culture, but UA not suggestive of infection.

## 2021-12-17 NOTE — ED Provider Notes (Signed)
[place] EMERGENCY DEPARTMENT Provider Note   CSN: [id number] Arrival date & time: 12/17/21  1041     History  Chief Complaint  Patient presents with   [name] [name] is a 61 y.o. male who presents to the emergency department complaint of a fall onset prior to arrival.  Patient notes that he was ambulating with his walker when he began to felt lightheaded which caused him to trip on a curb and fall onto his left hip.  Notes that he hit his head.  Denies blood thinners. Denies LOC, vomiting, nausea, chest pain, shortness of breath, abdominal pain.  Given 200 mcg of fentanyl by EMS prior to arrival.   The history is provided by the patient. No language interpreter was used.       Home Medications Prior to Admission medications   Medication Sig Start Date End Date Taking? Authorizing Provider  acetaminophen (TYLENOL) 500 MG tablet Take 1,000 mg by mouth at bedtime as needed for mild pain.   Yes [provider]  acitretin ([name]) 25 MG capsule Take 25 mg by mouth daily. 11/18/21  Yes [provider]  CALCIUM PO Take 1 tablet by mouth daily.   Yes [provider]  cemiplimab-rwlc (LIBTAYO) 350 MG/7ML SOLN injection Inject into the vein every 6 (six) weeks.   Yes [provider]  Cholecalciferol (VITAMIN D-3 PO) Take 1 capsule by mouth daily.   Yes [provider]  cycloSPORINE (RESTASIS) 0.05 % ophthalmic emulsion Place 1 drop into both eyes 2 (two) times daily as needed (dry eyes). 05/04/21  Yes [provider]  desonide (DESOWEN) 0.05 % ointment Apply 1 Application topically 3 (three) times daily. Mix with Mupirocin 2%. To lower lip.   Yes [provider]  esomeprazole (NEXIUM) 40 MG capsule Take 1 capsule (40 mg total) by mouth 2 (two) times daily before a meal. 06/04/21  Yes Ladene Artist, MD  Eszopiclone 3 MG TABS Take 3 mg by mouth at bedtime as needed (sleep).   Yes [provider]  feeding supplement (ENSURE ENLIVE / ENSURE PLUS) LIQD Take 237 mLs by mouth 2 (two) times daily between meals. 12/11/20  Yes Cristal Deer, MD  folic acid (FOLVITE) 1 MG tablet Take 2 tablets (2 mg total) by mouth daily. 01/11/20  Yes Jacelyn Pi, Irma M, MD  hydrocortisone 2.5 % cream Apply 1 Application topically 2 (two) times daily as needed (Itchy skin).   Yes [provider]  Lactase (DAIRY RELIEF PO) Take 1 tablet by mouth in the morning and at bedtime.   Yes [provider]  lamoTRIgine (LAMICTAL) 150 MG tablet Take 1 tablet (150 mg total) by mouth 2 (two) times daily. 10/15/21  Yes Lomax, Amy, NP  latanoprost (XALATAN) 0.005 % ophthalmic solution Place 1 drop into both eyes at bedtime.    Yes [provider]  mesalamine (LIALDA) 1.2 g EC tablet Take 2 tablets (2.4 g total) by mouth 2 (two) times daily. Patient taking differently: Take 2.4 g by mouth daily. 12/17/21 03/17/22 Yes Ladene Artist, MD  methscopolamine (PAMINE FORTE) 5 MG tablet Take 1 tablet (5 mg total) by mouth 2 (two) times daily. 12/17/21 03/17/22 Yes Ladene Artist, MD  mupirocin ointment (BACTROBAN) 2 % Apply 1 Application topically 3 (three) times daily. Mix with Desonide 0.05%. To lower lip.   Yes [provider]  potassium chloride SA (KLOR-CON) 20 MEQ tablet Take 1 tablet (20 mEq  total) by mouth 2 (two) times daily. 01/11/20  Yes Jacelyn Pi, Lilia Argue, MD  Probiotic Product (PROBIOTIC PO) Take 1 tablet at bedtime by mouth.    Yes [provider]  rifaximin (XIFAXAN) 550 MG TABS tablet Take 1 tablet (550 mg total) by mouth 2 (two) times daily. 06/04/21  Yes Ladene Artist, MD  sertraline (ZOLOFT) 100 MG tablet Take 200 mg daily with breakfast by mouth.  03/16/17  Yes [provider]  tamsulosin (FLOMAX) 0.4 MG CAPS capsule Take 0.4 mg by mouth at bedtime. 12/07/21  Yes [provider]  tolterodine (DETROL LA) 4 MG 24 hr capsule Take 4 mg by  mouth daily. 01/25/21  Yes [provider]  traZODone (DESYREL) 150 MG tablet Take 150 mg by mouth at bedtime.   Yes [provider]      Allergies    Sulfonamide derivatives, Z-pak [azithromycin], Dilaudid [hydromorphone hcl], and Sulfa antibiotics    Review of Systems   Review of Systems  Respiratory:  Negative for shortness of breath.   Cardiovascular:  Negative for chest pain.  Gastrointestinal:  Negative for abdominal pain, nausea and vomiting.  Musculoskeletal:  Positive for arthralgias. Negative for joint swelling.  Neurological:  Positive for light-headedness. Negative for dizziness and syncope.  All other systems reviewed and are negative.   Physical Exam Updated Vital Signs BP 114/76   Pulse 85   Temp 98.5 F (36.9 C) (Oral)   Resp 18   Ht 5' 11"  (1.803 m)   Wt 72 kg   SpO2 95%   BMI 22.14 kg/m  Physical Exam Vitals and nursing note reviewed.  Constitutional:      General: He is not in acute distress.    Appearance: He is not diaphoretic.  HENT:     Head: Normocephalic and atraumatic.     Mouth/Throat:     Pharynx: No oropharyngeal exudate.  Eyes:     General: No scleral icterus.    Conjunctiva/sclera: Conjunctivae normal.  Neck:     Comments: C-collar in place. Cardiovascular:     Rate and Rhythm: Normal rate and regular rhythm.     Pulses: Normal pulses.     Heart sounds: Normal heart sounds.  Pulmonary:     Effort: Pulmonary effort is normal. No respiratory distress.     Breath sounds: Normal breath sounds. No wheezing.  Abdominal:     General: Bowel sounds are normal.     Palpations: Abdomen is soft. There is no mass.     Tenderness: There is no abdominal tenderness. There is no guarding or rebound.  Musculoskeletal:        General: Normal range of motion.     Cervical back: Normal range of motion and neck supple.     Comments: Left hip externally rotated with TTP noted to lateral aspect of left hip without overlying skin changes.   Tenderness to palpation noted to left lateral lower ribs without overlying skin changes.  No obvious step-off, deformity, crepitus noted.  No spinal tenderness to palpation.  Skin:    General: Skin is warm and dry.  Neurological:     Mental Status: He is alert.  Psychiatric:        Behavior: Behavior normal.     ED Results / Procedures / Treatments   Labs (all labs ordered are listed, but only abnormal results are displayed) Labs Reviewed  BASIC METABOLIC PANEL - Abnormal; Notable for the following components:      Result Value  Chloride 113 (*)    All other components within normal limits  CBC WITH DIFFERENTIAL/PLATELET - Abnormal; Notable for the following components:   RBC 3.43 (*)    Hemoglobin 11.0 (*)    HCT 32.6 (*)    Lymphs Abs 0.5 (*)    All other components within normal limits  URINALYSIS, ROUTINE W REFLEX MICROSCOPIC - Abnormal; Notable for the following components:   APPearance HAZY (*)    Hgb urine dipstick LARGE (*)    Protein, ur 100 (*)    RBC / HPF >50 (*)    Bacteria, UA RARE (*)    All other components within normal limits  PROTIME-INR  HEMOGLOBIN A1C  HIV ANTIBODY (ROUTINE TESTING W REFLEX)  CBG MONITORING, ED  TYPE AND SCREEN  ABO/RH    EKG EKG Interpretation  Date/Time:  Monday December 17 2021 10:47:50 EDT Ventricular Rate:  77 PR Interval:  197 QRS Duration: 98 QT Interval:  408 QTC Calculation: 462 R Axis:   2 Text Interpretation: Sinus rhythm Left ventricular hypertrophy Confirmed by Lacretia Leigh ([id number]) on 12/17/2021 12:17:50 PM  Radiology CT Cervical Spine Wo Contrast  Result Date: 12/17/2021 CLINICAL DATA:  Trip and fall on a cement curb. EXAM: CT HEAD WITHOUT CONTRAST CT MAXILLOFACIAL WITHOUT CONTRAST CT CERVICAL SPINE WITHOUT CONTRAST TECHNIQUE: Multidetector CT imaging of the head, cervical spine, and maxillofacial structures were performed using the standard protocol without intravenous contrast. Multiplanar CT image  reconstructions of the cervical spine and maxillofacial structures were also generated. RADIATION DOSE REDUCTION: This exam was performed according to the departmental dose-optimization program which includes automated exposure control, adjustment of the mA and/or kV according to patient size and/or use of iterative reconstruction technique. COMPARISON:  CT head dated January 14, 2021 FINDINGS: CT HEAD FINDINGS Brain: No evidence of acute infarction, hemorrhage, hydrocephalus, extra-axial collection or mass lesion/mass effect. Mild cerebral volume loss. Vascular: No hyperdense vessel or unexpected calcification. Skull: Normal. Negative for fracture or focal lesion. Other: None CT MAXILLOFACIAL FINDINGS Osseous: No fracture or mandibular dislocation. No destructive process. Orbits: Negative. No traumatic or inflammatory finding. Bilateral cataract surgery. Sinuses: Clear. Soft tissues: Negative. CT CERVICAL SPINE FINDINGS Alignment: Mild anterolisthesis of C6. Skull base and vertebrae: No acute fracture. No primary bone lesion or focal pathologic process. Soft tissues and spinal canal: No prevertebral fluid or swelling. No visible canal hematoma. Disc levels: Multilevel degenerate disc disease with mild spinal canal stenosis and mild bilateral neural foraminal stenosis at C4-C5. Prominent bulky anterior osteophytes. Upper chest: Negative. Other: None IMPRESSION: 1.  No CT evidence of acute intracranial abnormality. 2.  Mild cerebral volume loss. 3.  No acute cervical spine fracture or traumatic subluxation. 4. Moderate multilevel degenerate disc disease of the cervical spine with prominent anterior osteophytes. Mild spinal canal and neural foraminal stenosis at C4-C5. 5.  No maxillofacial fracture or significant soft tissue injury. Electronically Signed   By: Keane Police D.O.   On: 12/17/2021 12:33   CT Head Wo Contrast  Result Date: 12/17/2021 CLINICAL DATA:  Trip and fall on a cement curb. EXAM: CT HEAD WITHOUT  CONTRAST CT MAXILLOFACIAL WITHOUT CONTRAST CT CERVICAL SPINE WITHOUT CONTRAST TECHNIQUE: Multidetector CT imaging of the head, cervical spine, and maxillofacial structures were performed using the standard protocol without intravenous contrast. Multiplanar CT image reconstructions of the cervical spine and maxillofacial structures were also generated. RADIATION DOSE REDUCTION: This exam was performed according to the departmental dose-optimization program which includes automated exposure control, adjustment of the mA and/or  kV according to patient size and/or use of iterative reconstruction technique. COMPARISON:  CT head dated January 14, 2021 FINDINGS: CT HEAD FINDINGS Brain: No evidence of acute infarction, hemorrhage, hydrocephalus, extra-axial collection or mass lesion/mass effect. Mild cerebral volume loss. Vascular: No hyperdense vessel or unexpected calcification. Skull: Normal. Negative for fracture or focal lesion. Other: None CT MAXILLOFACIAL FINDINGS Osseous: No fracture or mandibular dislocation. No destructive process. Orbits: Negative. No traumatic or inflammatory finding. Bilateral cataract surgery. Sinuses: Clear. Soft tissues: Negative. CT CERVICAL SPINE FINDINGS Alignment: Mild anterolisthesis of C6. Skull base and vertebrae: No acute fracture. No primary bone lesion or focal pathologic process. Soft tissues and spinal canal: No prevertebral fluid or swelling. No visible canal hematoma. Disc levels: Multilevel degenerate disc disease with mild spinal canal stenosis and mild bilateral neural foraminal stenosis at C4-C5. Prominent bulky anterior osteophytes. Upper chest: Negative. Other: None IMPRESSION: 1.  No CT evidence of acute intracranial abnormality. 2.  Mild cerebral volume loss. 3.  No acute cervical spine fracture or traumatic subluxation. 4. Moderate multilevel degenerate disc disease of the cervical spine with prominent anterior osteophytes. Mild spinal canal and neural foraminal stenosis  at C4-C5. 5.  No maxillofacial fracture or significant soft tissue injury. Electronically Signed   By: Keane Police D.O.   On: 12/17/2021 12:33   CT Maxillofacial Wo Contrast  Result Date: 12/17/2021 CLINICAL DATA:  Trip and fall on a cement curb. EXAM: CT HEAD WITHOUT CONTRAST CT MAXILLOFACIAL WITHOUT CONTRAST CT CERVICAL SPINE WITHOUT CONTRAST TECHNIQUE: Multidetector CT imaging of the head, cervical spine, and maxillofacial structures were performed using the standard protocol without intravenous contrast. Multiplanar CT image reconstructions of the cervical spine and maxillofacial structures were also generated. RADIATION DOSE REDUCTION: This exam was performed according to the departmental dose-optimization program which includes automated exposure control, adjustment of the mA and/or kV according to patient size and/or use of iterative reconstruction technique. COMPARISON:  CT head dated January 14, 2021 FINDINGS: CT HEAD FINDINGS Brain: No evidence of acute infarction, hemorrhage, hydrocephalus, extra-axial collection or mass lesion/mass effect. Mild cerebral volume loss. Vascular: No hyperdense vessel or unexpected calcification. Skull: Normal. Negative for fracture or focal lesion. Other: None CT MAXILLOFACIAL FINDINGS Osseous: No fracture or mandibular dislocation. No destructive process. Orbits: Negative. No traumatic or inflammatory finding. Bilateral cataract surgery. Sinuses: Clear. Soft tissues: Negative. CT CERVICAL SPINE FINDINGS Alignment: Mild anterolisthesis of C6. Skull base and vertebrae: No acute fracture. No primary bone lesion or focal pathologic process. Soft tissues and spinal canal: No prevertebral fluid or swelling. No visible canal hematoma. Disc levels: Multilevel degenerate disc disease with mild spinal canal stenosis and mild bilateral neural foraminal stenosis at C4-C5. Prominent bulky anterior osteophytes. Upper chest: Negative. Other: None IMPRESSION: 1.  No CT evidence of acute  intracranial abnormality. 2.  Mild cerebral volume loss. 3.  No acute cervical spine fracture or traumatic subluxation. 4. Moderate multilevel degenerate disc disease of the cervical spine with prominent anterior osteophytes. Mild spinal canal and neural foraminal stenosis at C4-C5. 5.  No maxillofacial fracture or significant soft tissue injury. Electronically Signed   By: Keane Police D.O.   On: 12/17/2021 12:33   DG Hip Unilat W or Wo Pelvis 2-3 Views Left  Result Date: 12/17/2021 CLINICAL DATA:  [place]. Left hip pain. EXAM: DG HIP (WITH OR WITHOUT PELVIS) 2-3V LEFT COMPARISON:  None Available. FINDINGS: Mildly displaced intertrochanteric fracture of the left hip. The right hip is intact. The bony pelvis is intact. The pubic symphysis  and SI joints appear normal. IMPRESSION: Mildly displaced intertrochanteric fracture of the left hip. Electronically Signed   By: Marijo Sanes M.D.   On: 12/17/2021 11:53   DG Ribs Unilateral W/Chest Left  Result Date: 12/17/2021 CLINICAL DATA:  [place]. Left rib pain. EXAM: LEFT RIBS AND CHEST - 3+ VIEW COMPARISON:  Chest x-ray 01/14/2021 FINDINGS: The cardiac silhouette, mediastinal and hilar contours are within normal limits. No infiltrates, effusions or pneumothorax. Remote healed right rib fractures are noted. Dedicated views of the left ribs are limited by overlying EKG wires. Suspect remote healed rib fractures and an acute nondisplaced sixth anterior rib fracture. IMPRESSION: 1. No acute cardiopulmonary findings. 2. Suspect nondisplaced left sixth anterior rib fracture. Electronically Signed   By: Marijo Sanes M.D.   On: 12/17/2021 11:52    Procedures Procedures    Medications Ordered in ED Medications  chlorhexidine (HIBICLENS) 4 % liquid 4 Application (has no administration in time range)  povidone-iodine 10 % swab 2 Application (has no administration in time range)  ceFAZolin (ANCEF) IVPB 2g/100 mL premix (has no administration in time range)  insulin  aspart (novoLOG) injection 0-9 Units (has no administration in time range)  HYDROcodone-acetaminophen (NORCO/VICODIN) 5-325 MG per tablet 1-2 tablet (has no administration in time range)  morphine (PF) 2 MG/ML injection 1 mg (has no administration in time range)  methocarbamol (ROBAXIN) tablet 500 mg (has no administration in time range)    Or  methocarbamol (ROBAXIN) 500 mg in dextrose 5 % 50 mL IVPB (has no administration in time range)  morphine (PF) 4 MG/ML injection 4 mg (4 mg Intravenous Given 12/17/21 1319)    ED Course/ Medical Decision Making/ A&P Clinical Course as of 12/17/21 [place] Dec 17, 2021  1240 Re-evaluated and discussed xray findings with patient and wife at bedside.  [SB]  1302 Consult to Orthopedic surgery, PA-C, Silvestre Gunner who recommends medical admission and will evaluate the patient in the ED.  [SB]  1316 Consult with hospitalist, Dr. Rogers Blocker who will evaluate the patient. [SB]    Clinical Course User Index [SB] Camauri Craton A, PA-C                           Medical Decision Making Amount and/or Complexity of Data Reviewed Labs: ordered. Radiology: ordered.  Risk Prescription drug management. Decision regarding hospitalization.   Pt with fall occurring PTA. Pt hit his head, denies LOC or vomiting.  Patient afebrile, not tachycardic or hypoxic.  On exam, patient with Left hip externally rotated with TTP noted to lateral aspect of left hip without overlying skin changes.  Tenderness to palpation noted to left lateral lower ribs without overlying skin changes.  No obvious step-off, deformity, crepitus noted.  No spinal tenderness to palpation.  Contusion noted to right cheek with mild tenderness to palpation. No acute cardiovascular, respiratory, or abdominal exam findings. Differential diagnosis includes fracture, dislocation, herniation, hypoglycemia, anemia, arrhythmia, electrolyte abnormality.   Additional history obtained:  Additional history obtained  from Spouse/Significant Other  Labs:  I ordered, and personally interpreted labs.  The pertinent results include:   CBG at 96. CBC with hemoglobin at 11 however improved from previous value of 9.7), otherwise unremarkable BMP unremarkable Urinalysis with a large amount of hemoglobin otherwise nitrate and leukocyte negative.  Imaging: I ordered imaging studies including CT head, CT cervical spine, CT maxillofacial, left rib x-ray, left hip x-ray I independently visualized and interpreted imaging which showed: CT  head, cervical spine, maxillofacial:  1.  No CT evidence of acute intracranial abnormality.  2.  Mild cerebral volume loss.  3.  No acute cervical spine fracture or traumatic subluxation.  4. Moderate multilevel degenerate disc disease of the cervical spine  with prominent anterior osteophytes. Mild spinal canal and neural  foraminal stenosis at C4-C5.  5.  No maxillofacial fracture or significant soft tissue injury.   Left rib x-ray:  1. No acute cardiopulmonary findings.  2. Suspect nondisplaced left sixth anterior rib fracture.   Left hip x-ray: Mildly displaced intertrochanteric fracture of the left hip. I agree with the radiologist interpretation  Medications:  I ordered medication including morphine for pain management Reevaluation of the patient after these medicines and interventions, I reevaluated the patient and found that they have improved I have reviewed the patients home medicines and have made adjustments as needed   Consultations: I requested consultation with the Orthopedist, Silvestre Gunner, PA-C and discussed lab and imaging findings as well as pertinent plan - they recommend: medical admission and will evaluate in the ED.  -Consult with Hospitalist, Dr. Rogers Blocker who recommends evaluation in the ED for admission.    Disposition: Presentation suspicious for mildly displaced left trochanteric fracture.  Also suspicious for fracture of the left sixth rib.   Doubt contusion, dislocation, intracranial abnormality, herniation at this time.  Doubt hypoglycemia, anemia, arrhythmia, electrolyte abnormality as cause of fall today.  After consideration of the diagnostic results and the patients response to treatment, I feel that the patient would benefit from Admission to the hospital.  Discussed with patient and wife regarding admission plans. Answered all available questions. Pt appears safe for admission at this time.   This chart was dictated using voice recognition software, Dragon. Despite the best efforts of this provider to proofread and correct errors, errors may still occur which can change documentation meaning.   Final Clinical Impression(s) / ED Diagnoses Final diagnoses:  Fall, initial encounter  Closed fracture of one rib of left side, initial encounter  Unspecified trochanteric fracture of left femur, initial encounter for closed fracture [facility])    Rx / DC Orders ED Discharge Orders     None         Terie Lear A, PA-C 12/17/21 1410    Lacretia Leigh, MD 12/20/21 [phone]

## 2021-12-17 NOTE — ED Notes (Signed)
Signed consent at bedside  ?

## 2021-12-17 NOTE — Consult Note (Signed)
Reason for Consult:Left hip fx Referring Physician: Lorre Nick Time called: 1302 Time at bedside: 1321   [name] [name] is an 61 y.o. male.  HPI: [name] [name] was walking outside and got a little dizzy. He tried to make it to the grass but didn't quite get there and fell. He had immediate left hip pain and could not get up. He was brought to the ED where x-rays showed a left hip fx and orthopedic surgery was consulted. He has neurologic issues and uses 2 canes to ambulate outside but does so unassisted indoors. He does not work.  Past Medical History:  Diagnosis Date   Adenomatous colon polyp 10/1999   Anxiety    Arthritis    neck, yoga helps.   Cataract    Complex partial seizure (HCC)    last seizure was 08-30-2018   Crohn's disease of small and large intestines (HCC) 1999   Depression    Diabetes mellitus    no meds at this time 10-24-17   Esophageal stricture    External hemorrhoids    GERD (gastroesophageal reflux disease)    Glaucoma    History of kidney stones    3 large stones still present   Hyperlipemia    Hypertension    resolved with weight loss    Inguinal hernia, bilateral 12/2019   Liver cirrhosis secondary to NASH (HCC)    Migraines    Osteoporosis    PONV (postoperative nausea and vomiting)    PTSD (post-traumatic stress disorder)    Renal cyst, acquired, left 07/08/2017   Skin cancer    squamous cell multiple; [facility]; followed every 3 months.   Sleep apnea    CPAP machine, uses nightly   Staphylococcus aureus bacteremia with sepsis (HCC) 05/28/2017   MRSA 2012    Past Surgical History:  Procedure Laterality Date   CATARACT EXTRACTION Bilateral    COLONOSCOPY     DEBRIDEMENT MANDIBLE N/A 12/07/2020   Procedure: DEBRIDEMENT OF MANDIBLE;  Surgeon: Enis Slipper, DMD;  Location: [facility] OR;  Service: Oral Surgery;  Laterality: N/A;   ELBOW SURGERY  2012   elbow MRSA infection    EYE SURGERY     cataracts removed, /w IOL   GROSS TEETH DEBRIDEMENT N/A  12/29/2020   Procedure: MANDIBULAR  DEBRIDEMENT;  Surgeon: Enis Slipper, DMD;  Location: [facility] OR;  Service: Dentistry;  Laterality: N/A;   INCISION AND DRAINAGE ABSCESS N/A 12/07/2020   Procedure: INCISION AND DRAINAGE MANDIBULAR ABSCESS;  Surgeon: Enis Slipper, DMD;  Location: [facility] OR;  Service: Oral Surgery;  Laterality: N/A;   INGUINAL HERNIA REPAIR Right 01/14/2020   Procedure: OPEN RIGHT HERNIA REPAIR INGUINAL WITH MESH;  Surgeon: Berna Bue, MD;  Location: [facility] ORS;  Service: General;  Laterality: Right;   INSERTION OF MESH N/A 07/01/2016   Procedure: INSERTION OF MESH;  Surgeon: Avel Peace, MD;  Location: [facility] OR;  Service: General;  Laterality: N/A;   IR URETERAL STENT LEFT NEW ACCESS W/O SEP NEPHROSTOMY CATH  02/02/2018   NEPHROLITHOTOMY Left 02/02/2018   Procedure: NEPHROLITHOTOMY PERCUTANEOUS;  Surgeon: Ihor Gully, MD;  Location: [facility] ORS;  Service: Urology;  Laterality: Left;   POLYPECTOMY     SCALP LACERATION REPAIR Right 10/16/2017   From fall/staples   SHOULDER ARTHROSCOPY Right 03/05/2018   Procedure: ARTHROSCOPY SHOULDER AND OPEN DISTAL CLAVICLE EXCISION;  Surgeon: Marcene Corning, MD;  Location: [facility] OR;  Service: Orthopedics;  Laterality: Right;   SHOULDER SURGERY     SINUS SURGERY  WITH INSTATRAK     TEE WITHOUT CARDIOVERSION N/A 05/09/2017   Procedure: TRANSESOPHAGEAL ECHOCARDIOGRAM (TEE);  Surgeon: Jake Bathe, MD;  Location: [facility] ENDOSCOPY;  Service: Cardiovascular;  Laterality: N/A;   TOOTH EXTRACTION N/A 12/07/2020   Procedure: DENTAL EXTRACTIONS OF TEETH TWENTY TO THIRTY;  Surgeon: Enis Slipper, DMD;  Location: [facility] OR;  Service: Oral Surgery;  Laterality: N/A;   TOOTH EXTRACTION N/A 12/29/2020   Procedure: DENTAL RESTORATION/EXTRACTIONS;  Surgeon: Enis Slipper, DMD;  Location: [facility] OR;  Service: Dentistry;  Laterality: N/A;   TRANSTHORACIC ECHOCARDIOGRAM  02/22/2011   EF 55-65%; increased pattern of LVH with mild conc hypertrophy, abnormal relaxation &  increased filling pressure (grade 2 diastolic dysfunction); atrial septum thickened (lipomatous hypertrophy)   UMBILICAL HERNIA REPAIR N/A 07/01/2016   Procedure: UMBILICAL HERNIA REPAIR WITH MESH;  Surgeon: Avel Peace, MD;  Location: [facility] OR;  Service: General;  Laterality: N/A;   UPPER GASTROINTESTINAL ENDOSCOPY     VASECTOMY      Family History  Problem Relation Age of Onset   Heart disease Mother        CABG at age 12   Hyperlipidemia Mother    Hypertension Mother    Diabetes Father    Colon polyps Father    Stroke Father    Hyperlipidemia Father    Hypertension Father    Stroke Paternal Grandmother    Stroke Paternal Grandfather    Other Child        tetrology of fallot (cornealia deland syndrome)   Colon cancer Neg Hx    Esophageal cancer Neg Hx    Stomach cancer Neg Hx    Rectal cancer Neg Hx     Social History:  reports that he has never smoked. He has never been exposed to tobacco smoke. He has never used smokeless tobacco. He reports that he does not drink alcohol and does not use drugs.  Allergies:  Allergies  Allergen Reactions   Sulfonamide Derivatives Hives   Z-Pak [Azithromycin] Swelling   Dilaudid [Hydromorphone Hcl] Nausea And Vomiting   Sulfa Antibiotics Hives and Rash    Medications: I have reviewed the patient's current medications.  Results for orders placed or performed during the hospital encounter of 12/17/21 (from the past 48 hour(s))  CBG monitoring, ED     Status: None   Collection Time: 12/17/21 10:49 AM  Result Value Ref Range   Glucose-Capillary 96 70 - 99 mg/dL    Comment: Glucose reference range applies only to samples taken after fasting for at least 8 hours.  Basic metabolic panel     Status: Abnormal   Collection Time: 12/17/21 11:02 AM  Result Value Ref Range   Sodium 143 135 - 145 mmol/L   Potassium 3.7 3.5 - 5.1 mmol/L   Chloride 113 (H) 98 - 111 mmol/L   CO2 22 22 - 32 mmol/L   Glucose, Bld 83 70 - 99 mg/dL    Comment:  Glucose reference range applies only to samples taken after fasting for at least 8 hours.   BUN 14 6 - 20 mg/dL   Creatinine, Ser 4.78 0.61 - 1.24 mg/dL   Calcium 9.3 8.9 - 29.5 mg/dL   GFR, Estimated >62 >13 mL/min    Comment: (NOTE) Calculated using the CKD-EPI Creatinine Equation (2021)    Anion gap 8 5 - 15    Comment: Performed at [facility], [address], [place], [place] [address]  CBC with Differential  Status: Abnormal   Collection Time: 12/17/21 11:02 AM  Result Value Ref Range   WBC 5.1 4.0 - 10.5 K/uL   RBC 3.43 (L) 4.22 - 5.81 MIL/uL   Hemoglobin 11.0 (L) 13.0 - 17.0 g/dL   HCT 21.3 (L) 08.6 - 57.8 %   MCV 95.0 80.0 - 100.0 fL   MCH 32.1 26.0 - 34.0 pg   MCHC 33.7 30.0 - 36.0 g/dL   RDW 46.9 62.9 - 52.8 %   Platelets PLATELET CLUMPS NOTED ON SMEAR, UNABLE TO ESTIMATE 150 - 400 K/uL    Comment: Immature Platelet Fraction may be clinically indicated, consider ordering this additional test [id number] REPEATED TO VERIFY    nRBC 0.0 0.0 - 0.2 %   Neutrophils Relative % 75 %   Neutro Abs 3.8 1.7 - 7.7 K/uL   Lymphocytes Relative 11 %   Lymphs Abs 0.5 (L) 0.7 - 4.0 K/uL   Monocytes Relative 9 %   Monocytes Absolute 0.5 0.1 - 1.0 K/uL   Eosinophils Relative 4 %   Eosinophils Absolute 0.2 0.0 - 0.5 K/uL   Basophils Relative 1 %   Basophils Absolute 0.0 0.0 - 0.1 K/uL   Immature Granulocytes 0 %   Abs Immature Granulocytes 0.02 0.00 - 0.07 K/uL    Comment: Performed at [facility], [address], [place], [place] [address]  Urinalysis, Routine w reflex microscopic Urine, Clean Catch     Status: Abnormal   Collection Time: 12/17/21 11:03 AM  Result Value Ref Range   Color, Urine YELLOW YELLOW   APPearance HAZY (A) CLEAR   Specific Gravity, Urine 1.015 1.005 - 1.030   pH 5.0 5.0 - 8.0   Glucose, UA NEGATIVE NEGATIVE mg/dL   Hgb urine dipstick LARGE (A) NEGATIVE   Bilirubin Urine NEGATIVE NEGATIVE   Ketones, ur NEGATIVE NEGATIVE mg/dL   Protein,  ur 366 (A) NEGATIVE mg/dL   Nitrite NEGATIVE NEGATIVE   Leukocytes,Ua NEGATIVE NEGATIVE   RBC / HPF >50 (H) 0 - 5 RBC/hpf   WBC, UA 11-20 0 - 5 WBC/hpf   Bacteria, UA RARE (A) NONE SEEN   Mucus PRESENT     Comment: Performed at [facility], [address], [place], [place] [address]   *Note: Due to a large number of results and/or encounters for the requested time period, some results have not been displayed. A complete set of results can be found in Results Review.    CT Cervical Spine Wo Contrast  Result Date: 12/17/2021 CLINICAL DATA:  Trip and fall on a cement curb. EXAM: CT HEAD WITHOUT CONTRAST CT MAXILLOFACIAL WITHOUT CONTRAST CT CERVICAL SPINE WITHOUT CONTRAST TECHNIQUE: Multidetector CT imaging of the head, cervical spine, and maxillofacial structures were performed using the standard protocol without intravenous contrast. Multiplanar CT image reconstructions of the cervical spine and maxillofacial structures were also generated. RADIATION DOSE REDUCTION: This exam was performed according to the departmental dose-optimization program which includes automated exposure control, adjustment of the mA and/or kV according to patient size and/or use of iterative reconstruction technique. COMPARISON:  CT head dated January 14, 2021 FINDINGS: CT HEAD FINDINGS Brain: No evidence of acute infarction, hemorrhage, hydrocephalus, extra-axial collection or mass lesion/mass effect. Mild cerebral volume loss. Vascular: No hyperdense vessel or unexpected calcification. Skull: Normal. Negative for fracture or focal lesion. Other: None CT MAXILLOFACIAL FINDINGS Osseous: No fracture or mandibular dislocation. No destructive process. Orbits: Negative. No traumatic or inflammatory finding. Bilateral cataract surgery. Sinuses: Clear. Soft tissues: Negative.  CT CERVICAL SPINE FINDINGS Alignment: Mild anterolisthesis of C6. Skull base and vertebrae: No acute fracture. No primary bone lesion or focal pathologic  process. Soft tissues and spinal canal: No prevertebral fluid or swelling. No visible canal hematoma. Disc levels: Multilevel degenerate disc disease with mild spinal canal stenosis and mild bilateral neural foraminal stenosis at C4-C5. Prominent bulky anterior osteophytes. Upper chest: Negative. Other: None IMPRESSION: 1.  No CT evidence of acute intracranial abnormality. 2.  Mild cerebral volume loss. 3.  No acute cervical spine fracture or traumatic subluxation. 4. Moderate multilevel degenerate disc disease of the cervical spine with prominent anterior osteophytes. Mild spinal canal and neural foraminal stenosis at C4-C5. 5.  No maxillofacial fracture or significant soft tissue injury. Electronically Signed   By: Larose Hires D.O.   On: 12/17/2021 12:33   CT Head Wo Contrast  Result Date: 12/17/2021 CLINICAL DATA:  Trip and fall on a cement curb. EXAM: CT HEAD WITHOUT CONTRAST CT MAXILLOFACIAL WITHOUT CONTRAST CT CERVICAL SPINE WITHOUT CONTRAST TECHNIQUE: Multidetector CT imaging of the head, cervical spine, and maxillofacial structures were performed using the standard protocol without intravenous contrast. Multiplanar CT image reconstructions of the cervical spine and maxillofacial structures were also generated. RADIATION DOSE REDUCTION: This exam was performed according to the departmental dose-optimization program which includes automated exposure control, adjustment of the mA and/or kV according to patient size and/or use of iterative reconstruction technique. COMPARISON:  CT head dated January 14, 2021 FINDINGS: CT HEAD FINDINGS Brain: No evidence of acute infarction, hemorrhage, hydrocephalus, extra-axial collection or mass lesion/mass effect. Mild cerebral volume loss. Vascular: No hyperdense vessel or unexpected calcification. Skull: Normal. Negative for fracture or focal lesion. Other: None CT MAXILLOFACIAL FINDINGS Osseous: No fracture or mandibular dislocation. No destructive process. Orbits:  Negative. No traumatic or inflammatory finding. Bilateral cataract surgery. Sinuses: Clear. Soft tissues: Negative. CT CERVICAL SPINE FINDINGS Alignment: Mild anterolisthesis of C6. Skull base and vertebrae: No acute fracture. No primary bone lesion or focal pathologic process. Soft tissues and spinal canal: No prevertebral fluid or swelling. No visible canal hematoma. Disc levels: Multilevel degenerate disc disease with mild spinal canal stenosis and mild bilateral neural foraminal stenosis at C4-C5. Prominent bulky anterior osteophytes. Upper chest: Negative. Other: None IMPRESSION: 1.  No CT evidence of acute intracranial abnormality. 2.  Mild cerebral volume loss. 3.  No acute cervical spine fracture or traumatic subluxation. 4. Moderate multilevel degenerate disc disease of the cervical spine with prominent anterior osteophytes. Mild spinal canal and neural foraminal stenosis at C4-C5. 5.  No maxillofacial fracture or significant soft tissue injury. Electronically Signed   By: Larose Hires D.O.   On: 12/17/2021 12:33   CT Maxillofacial Wo Contrast  Result Date: 12/17/2021 CLINICAL DATA:  Trip and fall on a cement curb. EXAM: CT HEAD WITHOUT CONTRAST CT MAXILLOFACIAL WITHOUT CONTRAST CT CERVICAL SPINE WITHOUT CONTRAST TECHNIQUE: Multidetector CT imaging of the head, cervical spine, and maxillofacial structures were performed using the standard protocol without intravenous contrast. Multiplanar CT image reconstructions of the cervical spine and maxillofacial structures were also generated. RADIATION DOSE REDUCTION: This exam was performed according to the departmental dose-optimization program which includes automated exposure control, adjustment of the mA and/or kV according to patient size and/or use of iterative reconstruction technique. COMPARISON:  CT head dated January 14, 2021 FINDINGS: CT HEAD FINDINGS Brain: No evidence of acute infarction, hemorrhage, hydrocephalus, extra-axial collection or mass  lesion/mass effect. Mild cerebral volume loss. Vascular: No hyperdense vessel or unexpected calcification. Skull: Normal.  Negative for fracture or focal lesion. Other: None CT MAXILLOFACIAL FINDINGS Osseous: No fracture or mandibular dislocation. No destructive process. Orbits: Negative. No traumatic or inflammatory finding. Bilateral cataract surgery. Sinuses: Clear. Soft tissues: Negative. CT CERVICAL SPINE FINDINGS Alignment: Mild anterolisthesis of C6. Skull base and vertebrae: No acute fracture. No primary bone lesion or focal pathologic process. Soft tissues and spinal canal: No prevertebral fluid or swelling. No visible canal hematoma. Disc levels: Multilevel degenerate disc disease with mild spinal canal stenosis and mild bilateral neural foraminal stenosis at C4-C5. Prominent bulky anterior osteophytes. Upper chest: Negative. Other: None IMPRESSION: 1.  No CT evidence of acute intracranial abnormality. 2.  Mild cerebral volume loss. 3.  No acute cervical spine fracture or traumatic subluxation. 4. Moderate multilevel degenerate disc disease of the cervical spine with prominent anterior osteophytes. Mild spinal canal and neural foraminal stenosis at C4-C5. 5.  No maxillofacial fracture or significant soft tissue injury. Electronically Signed   By: Larose Hires D.O.   On: 12/17/2021 12:33   DG Hip Unilat W or Wo Pelvis 2-3 Views Left  Result Date: 12/17/2021 CLINICAL DATA:  Larey Seat. Left hip pain. EXAM: DG HIP (WITH OR WITHOUT PELVIS) 2-3V LEFT COMPARISON:  None Available. FINDINGS: Mildly displaced intertrochanteric fracture of the left hip. The right hip is intact. The bony pelvis is intact. The pubic symphysis and SI joints appear normal. IMPRESSION: Mildly displaced intertrochanteric fracture of the left hip. Electronically Signed   By: Rudie Meyer M.D.   On: 12/17/2021 11:53   DG Ribs Unilateral W/Chest Left  Result Date: 12/17/2021 CLINICAL DATA:  Larey Seat. Left rib pain. EXAM: LEFT RIBS AND CHEST -  3+ VIEW COMPARISON:  Chest x-ray 01/14/2021 FINDINGS: The cardiac silhouette, mediastinal and hilar contours are within normal limits. No infiltrates, effusions or pneumothorax. Remote healed right rib fractures are noted. Dedicated views of the left ribs are limited by overlying EKG wires. Suspect remote healed rib fractures and an acute nondisplaced sixth anterior rib fracture. IMPRESSION: 1. No acute cardiopulmonary findings. 2. Suspect nondisplaced left sixth anterior rib fracture. Electronically Signed   By: Rudie Meyer M.D.   On: 12/17/2021 11:52    Review of Systems  HENT:  Negative for ear discharge, ear pain, hearing loss and tinnitus.   Eyes:  Negative for photophobia and pain.  Respiratory:  Negative for cough and shortness of breath.   Cardiovascular:  Negative for chest pain.  Gastrointestinal:  Negative for abdominal pain, nausea and vomiting.  Genitourinary:  Negative for dysuria, flank pain, frequency and urgency.  Musculoskeletal:  Positive for arthralgias (Left hip). Negative for back pain, myalgias and neck pain.  Neurological:  Negative for dizziness and headaches.  Hematological:  Does not bruise/bleed easily.  Psychiatric/Behavioral:  The patient is not nervous/anxious.    Blood pressure 134/77, pulse 73, temperature 98.6 F (37 C), temperature source Oral, resp. rate 20, height 5\' 11"  (1.803 m), weight 72 kg, SpO2 100 %. Physical Exam Constitutional:      General: He is not in acute distress.    Appearance: He is well-developed. He is not diaphoretic.  HENT:     Head: Normocephalic and atraumatic.  Eyes:     General: No scleral icterus.       Right eye: No discharge.        Left eye: No discharge.     Conjunctiva/sclera: Conjunctivae normal.  Cardiovascular:     Rate and Rhythm: Normal rate and regular rhythm.  Pulmonary:     Effort:  Pulmonary effort is normal. No respiratory distress.  Musculoskeletal:     Cervical back: Normal range of motion.     Comments:  LLE No traumatic wounds, ecchymosis, or rash  Severe TTP hip  No knee or ankle effusion  Knee stable to varus/ valgus and anterior/posterior stress  Sens DPN, SPN, TN intact  Motor EHL, ext, flex, evers 5/5  DP 2+, PT 0, No significant edema  Skin:    General: Skin is warm and dry.  Neurological:     Mental Status: He is alert.  Psychiatric:        Mood and Affect: Mood normal.        Behavior: Behavior normal.     Assessment/Plan: Left hip fx -- Plan IMN today with Dr. Carola Frost. Please keep NPO.    Freeman Caldron, PA-C Orthopedic Surgery [phone] 12/17/2021, 1:29 PM

## 2021-12-18 ENCOUNTER — Inpatient Hospital Stay (HOSPITAL_COMMUNITY): Payer: Medicare Other

## 2021-12-18 ENCOUNTER — Encounter (HOSPITAL_COMMUNITY): Payer: Self-pay | Admitting: Family Medicine

## 2021-12-18 DIAGNOSIS — R609 Edema, unspecified: Secondary | ICD-10-CM

## 2021-12-18 DIAGNOSIS — M79609 Pain in unspecified limb: Secondary | ICD-10-CM

## 2021-12-18 DIAGNOSIS — M80052A Age-related osteoporosis with current pathological fracture, left femur, initial encounter for fracture: Secondary | ICD-10-CM

## 2021-12-18 DIAGNOSIS — S72145A Nondisplaced intertrochanteric fracture of left femur, initial encounter for closed fracture: Secondary | ICD-10-CM | POA: Diagnosis not present

## 2021-12-18 HISTORY — DX: Age-related osteoporosis with current pathological fracture, left femur, initial encounter for fracture: M80.052A

## 2021-12-18 LAB — CBC
HCT: 25.7 % — ABNORMAL LOW (ref 39.0–52.0)
Hemoglobin: 8.8 g/dL — ABNORMAL LOW (ref 13.0–17.0)
MCH: 32.6 pg (ref 26.0–34.0)
MCHC: 34.2 g/dL (ref 30.0–36.0)
MCV: 95.2 fL (ref 80.0–100.0)
Platelets: 71 10*3/uL — ABNORMAL LOW (ref 150–400)
RBC: 2.7 MIL/uL — ABNORMAL LOW (ref 4.22–5.81)
RDW: 14 % (ref 11.5–15.5)
WBC: 6.3 10*3/uL (ref 4.0–10.5)
nRBC: 0 % (ref 0.0–0.2)

## 2021-12-18 LAB — URINE CULTURE

## 2021-12-18 LAB — BASIC METABOLIC PANEL
Anion gap: 9 (ref 5–15)
BUN: 16 mg/dL (ref 6–20)
CO2: 22 mmol/L (ref 22–32)
Calcium: 8.7 mg/dL — ABNORMAL LOW (ref 8.9–10.3)
Chloride: 107 mmol/L (ref 98–111)
Creatinine, Ser: 1.25 mg/dL — ABNORMAL HIGH (ref 0.61–1.24)
GFR, Estimated: >60 mL/min (ref >60)
Glucose, Bld: 180 mg/dL — ABNORMAL HIGH (ref 70–99)
Potassium: 3.9 mmol/L (ref 3.5–5.1)
Sodium: 138 mmol/L (ref 135–145)

## 2021-12-18 LAB — VITAMIN D 25 HYDROXY (VIT D DEFICIENCY, FRACTURES): Vit D, 25-Hydroxy: 69.75 ng/mL (ref 30–100)

## 2021-12-18 LAB — HIV ANTIBODY (ROUTINE TESTING W REFLEX): HIV Screen 4th Generation wRfx: NONREACTIVE

## 2021-12-18 LAB — GLUCOSE, CAPILLARY
Glucose-Capillary: 147 mg/dL — ABNORMAL HIGH (ref 70–99)
Glucose-Capillary: 116 mg/dL — ABNORMAL HIGH (ref 70–99)
Glucose-Capillary: 99 mg/dL (ref 70–99)
Glucose-Capillary: 129 mg/dL — ABNORMAL HIGH (ref 70–99)

## 2021-12-18 LAB — HEMOGLOBIN A1C
Hgb A1c MFr Bld: 4.6 % — ABNORMAL LOW (ref 4.8–5.6)
Mean Plasma Glucose: 85 mg/dL

## 2021-12-18 MED ORDER — OXYCODONE HCL 5 MG PO TABS
5.0000 mg | ORAL_TABLET | ORAL | Status: DC | PRN
  Administered 2021-12-18 – 2021-12-20 (×6): 10 mg via ORAL
  Filled 2021-12-18 (×6): qty 2

## 2021-12-18 MED ORDER — METHOCARBAMOL 750 MG PO TABS
750.0000 mg | ORAL_TABLET | Freq: Three times a day (TID) | ORAL | Status: DC
  Administered 2021-12-18 – 2021-12-24 (×19): 750 mg via ORAL
  Filled 2021-12-18 (×19): qty 1

## 2021-12-18 MED ORDER — ACETAMINOPHEN 325 MG PO TABS
650.0000 mg | ORAL_TABLET | Freq: Four times a day (QID) | ORAL | Status: DC
  Administered 2021-12-18 – 2021-12-19 (×4): 650 mg via ORAL
  Filled 2021-12-18 (×4): qty 2

## 2021-12-18 MED ORDER — SODIUM CHLORIDE 0.9 % IV SOLN
INTRAVENOUS | Status: DC
Start: 2021-12-18 — End: 2021-12-19

## 2021-12-18 MED ORDER — ACETAMINOPHEN 325 MG PO TABS: 650.0000 mg | ORAL_TABLET | Freq: Four times a day (QID) | ORAL | Status: DC

## 2021-12-18 MED ORDER — ENSURE ENLIVE PO LIQD
237.0000 mL | Freq: Three times a day (TID) | ORAL | Status: DC
  Administered 2021-12-18 – 2021-12-24 (×16): 237 mL via ORAL

## 2021-12-18 MED ORDER — SODIUM CHLORIDE 0.9% FLUSH
10.0000 mL | Freq: Two times a day (BID) | INTRAVENOUS | Status: DC
  Administered 2021-12-19 – 2021-12-24 (×8): 10 mL

## 2021-12-18 MED ORDER — ADULT MULTIVITAMIN W/MINERALS CH
1.0000 | ORAL_TABLET | Freq: Every day | ORAL | Status: DC
  Administered 2021-12-18 – 2021-12-24 (×7): 1 via ORAL
  Filled 2021-12-18 (×7): qty 1

## 2021-12-18 MED ORDER — METHOCARBAMOL 1000 MG/10ML IJ SOLN
500.0000 mg | Freq: Three times a day (TID) | INTRAVENOUS | Status: DC
  Filled 2021-12-18: qty 5

## 2021-12-18 MED ORDER — CHLORHEXIDINE GLUCONATE CLOTH 2 % EX PADS
6.0000 | MEDICATED_PAD | Freq: Every day | CUTANEOUS | Status: DC
  Administered 2021-12-18 – 2021-12-21 (×4): 6 via TOPICAL

## 2021-12-18 MED ORDER — SODIUM CHLORIDE 0.9% FLUSH
10.0000 mL | INTRAVENOUS | Status: DC | PRN
  Administered 2021-12-20: 10 mL

## 2021-12-18 NOTE — Progress Notes (Addendum)
Orthopaedic Trauma Service Progress Note  Patient ID: [name] [name] MRN: [id number] DOB/AGE: 60-Mar-1962 60 y.o.  Subjective:  C/o L hip pain  Perhaps slightly better than pre op Getting ready to work with PT  Given pts history we did send some reamings from his L hip to pathology though no suspicious lesions noted on plain xrays   He uses walking sticks when walking outside but does not typically use assistive devices around the house or when running errands   Does not appear that he received the fascia iliaca block for L hip fracture   ROS As above  Objective:   VITALS:   Vitals:   12/17/21 2340 12/18/21 0018 12/18/21 0504 12/18/21 0704  BP:  117/77 97/69 107/61  Pulse: 98 99 89 73  Resp: 16 16 16 17   Temp:  98.7 F (37.1 C) 98.4 F (36.9 C) 97.6 F (36.4 C)  TempSrc:  Oral Oral Oral  SpO2: 94% 97% 97% 100%  Weight:      Height:        Estimated body mass index is 22.14 kg/m as calculated from the following:   Height as of this encounter: 5\' 11"  (1.803 m).   Weight as of this encounter: 72 kg.   Intake/Output      06/26 0701 06/27 0700 06/27 0701 06/28 0700   P.O. 360 240   I.V. (mL/kg) 600 (8.3)    Other 1    IV Piggyback 707.9    Total Intake(mL/kg) 1668.9 (23.2) 240 (3.3)   Blood 150    Total Output 150    Net +1518.9 +240        Urine Occurrence 3 x      LABS  Results for orders placed or performed during the hospital encounter of 12/17/21 (from the past 24 hour(s))  CBG monitoring, ED     Status: None   Collection Time: 12/17/21 10:49 AM  Result Value Ref Range   Glucose-Capillary 96 70 - 99 mg/dL  Basic metabolic panel     Status: Abnormal   Collection Time: 12/17/21 11:02 AM  Result Value Ref Range   Sodium 143 135 - 145 mmol/L   Potassium 3.7 3.5 - 5.1 mmol/L   Chloride 113 (H) 98 - 111 mmol/L   CO2 22 22 - 32 mmol/L   Glucose, Bld 83 70 - 99 mg/dL   BUN 14 6  - 20 mg/dL   Creatinine, Ser 0.86 0.61 - 1.24 mg/dL   Calcium 9.3 8.9 - 57.8 mg/dL   GFR, Estimated >46 >96 mL/min   Anion gap 8 5 - 15  CBC with Differential     Status: Abnormal   Collection Time: 12/17/21 11:02 AM  Result Value Ref Range   WBC 5.1 4.0 - 10.5 K/uL   RBC 3.43 (L) 4.22 - 5.81 MIL/uL   Hemoglobin 11.0 (L) 13.0 - 17.0 g/dL   HCT 29.5 (L) 28.4 - 13.2 %   MCV 95.0 80.0 - 100.0 fL   MCH 32.1 26.0 - 34.0 pg   MCHC 33.7 30.0 - 36.0 g/dL   RDW 44.0 10.2 - 72.5 %   Platelets PLATELET CLUMPS NOTED ON SMEAR, UNABLE TO ESTIMATE 150 - 400 K/uL   nRBC 0.0 0.0 - 0.2 %   Neutrophils Relative % 75 %   Neutro  Abs 3.8 1.7 - 7.7 K/uL   Lymphocytes Relative 11 %   Lymphs Abs 0.5 (L) 0.7 - 4.0 K/uL   Monocytes Relative 9 %   Monocytes Absolute 0.5 0.1 - 1.0 K/uL   Eosinophils Relative 4 %   Eosinophils Absolute 0.2 0.0 - 0.5 K/uL   Basophils Relative 1 %   Basophils Absolute 0.0 0.0 - 0.1 K/uL   Immature Granulocytes 0 %   Abs Immature Granulocytes 0.02 0.00 - 0.07 K/uL  Hepatic function panel     Status: Abnormal   Collection Time: 12/17/21 11:02 AM  Result Value Ref Range   Total Protein 6.0 (L) 6.5 - 8.1 g/dL   Albumin 3.6 3.5 - 5.0 g/dL   AST 30 15 - 41 U/L   ALT 19 0 - 44 U/L   Alkaline Phosphatase 126 38 - 126 U/L   Total Bilirubin 1.8 (H) 0.3 - 1.2 mg/dL   Bilirubin, Direct 0.3 (H) 0.0 - 0.2 mg/dL   Indirect Bilirubin 1.5 (H) 0.3 - 0.9 mg/dL  Urinalysis, Routine w reflex microscopic Urine, Clean Catch     Status: Abnormal   Collection Time: 12/17/21 11:03 AM  Result Value Ref Range   Color, Urine YELLOW YELLOW   APPearance HAZY (A) CLEAR   Specific Gravity, Urine 1.015 1.005 - 1.030   pH 5.0 5.0 - 8.0   Glucose, UA NEGATIVE NEGATIVE mg/dL   Hgb urine dipstick LARGE (A) NEGATIVE   Bilirubin Urine NEGATIVE NEGATIVE   Ketones, ur NEGATIVE NEGATIVE mg/dL   Protein, ur 161 (A) NEGATIVE mg/dL   Nitrite NEGATIVE NEGATIVE   Leukocytes,Ua NEGATIVE NEGATIVE   RBC / HPF  >50 (H) 0 - 5 RBC/hpf   WBC, UA 11-20 0 - 5 WBC/hpf   Bacteria, UA RARE (A) NONE SEEN   Mucus PRESENT   Type and screen [facility]     Status: None   Collection Time: 12/17/21  1:40 PM  Result Value Ref Range   ABO/RH(D) O POS    Antibody Screen NEG    Sample Expiration      12/20/2021,2359 Performed at [facility], [address], [place], [place] [address]   Protime-INR     Status: Abnormal   Collection Time: 12/17/21  1:40 PM  Result Value Ref Range   Prothrombin Time 16.5 (H) 11.4 - 15.2 seconds   INR 1.3 (H) 0.8 - 1.2  Hemoglobin A1c     Status: Abnormal   Collection Time: 12/17/21  1:57 PM  Result Value Ref Range   Hgb A1c MFr Bld 4.6 (L) 4.8 - 5.6 %   Mean Plasma Glucose 85 mg/dL  ABO/Rh     Status: None   Collection Time: 12/17/21  1:57 PM  Result Value Ref Range   ABO/RH(D)      O POS Performed at [facility], [address] [place], [place] [address]   Glucose, capillary     Status: Abnormal   Collection Time: 12/17/21  7:40 PM  Result Value Ref Range   Glucose-Capillary 109 (H) 70 - 99 mg/dL  CBC     Status: Abnormal   Collection Time: 12/18/21  4:04 AM  Result Value Ref Range   WBC 6.3 4.0 - 10.5 K/uL   RBC 2.70 (L) 4.22 - 5.81 MIL/uL   Hemoglobin 8.8 (L) 13.0 - 17.0 g/dL   HCT 11.9 (L) 14.7 - 82.9 %   MCV 95.2 80.0 - 100.0 fL   MCH 32.6  26.0 - 34.0 pg   MCHC 34.2 30.0 - 36.0 g/dL   RDW 14.7 82.9 - 56.2 %   Platelets 71 (L) 150 - 400 K/uL   nRBC 0.0 0.0 - 0.2 %  Basic metabolic panel     Status: Abnormal   Collection Time: 12/18/21  4:04 AM  Result Value Ref Range   Sodium 138 135 - 145 mmol/L   Potassium 3.9 3.5 - 5.1 mmol/L   Chloride 107 98 - 111 mmol/L   CO2 22 22 - 32 mmol/L   Glucose, Bld 180 (H) 70 - 99 mg/dL   BUN 16 6 - 20 mg/dL   Creatinine, Ser 1.30 (H) 0.61 - 1.24 mg/dL   Calcium 8.7 (L) 8.9 - 10.3 mg/dL   GFR, Estimated >86 >57 mL/min   Anion gap 9 5 - 15  Glucose, capillary     Status: Abnormal    Collection Time: 12/18/21  7:25 AM  Result Value Ref Range   Glucose-Capillary 147 (H) 70 - 99 mg/dL   *Note: Due to a large number of results and/or encounters for the requested time period, some results have not been displayed. A complete set of results can be found in Results Review.     PHYSICAL EXAM:   Gen: sitting up in bed, appears older than stated age Ext:       Left Lower Extremity   Dressing stable  Ext warm   Motor and sensory functions intact  No DCT  Compartments are soft    Assessment/Plan: 1 Day Post-Op   Principal Problem:   Closed nondisplaced intertrochanteric fracture of left femur, initial encounter (HCC) Active Problems:   CROHN'S DISEASE, LARGE AND SMALL INTESTINES   DM2 (diabetes mellitus, type 2) (HCC)   Seizure (HCC)   OSA on CPAP   Depression   Renal calculus, left   Iron deficiency anemia due to chronic blood loss and thrombocytopenia    Liver cirrhosis secondary to NASH with liver mass    GERD (gastroesophageal reflux disease)   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 12/17/21 1335 12/17/21 1710   12/17/21 2300  ceFAZolin (ANCEF) IVPB 2g/100 mL premix        2 g 200 mL/hr over 30 Minutes Intravenous Every 6 hours 12/17/21 2002 12/18/21 0636   12/17/21 2200  rifaximin (XIFAXAN) tablet 550 mg        550 mg Oral 2 times daily 12/17/21 2000       .  POD/HD#: 40  61 year old male with fragility fracture left hip  -Ground-level fall  -Fragility fracture left hip s/p IMN   Weight-bear as tolerated left leg with assistance  Range of motion as tolerated  PT and OT  Ice as needed for swelling and pain control  Dressing changes as needed starting tomorrow   Given history surgical specimens from L  hip sent to pathology    - Pain management:  Multimodal   Changed to oxy IR   Scheduled robaxin and tylenol   - ABL anemia/Hemodynamics  Montior   Cbc in am   TXA given post op  - Medical issues   Per Primary    AKI  Likely hypovolemia    Monitor   - DVT/PE prophylaxis:  Lovenox   Will transition to eliquis prior to dc   - ID:   Periop abx  - Metabolic Bone Disease:  Fracture indicative of osteoporosis  Outpatient referral to osteoporosis clinic  Vitamin D levels pending  I do not see a DEXA in the system.  He will require 1 in the next month or 2  - Activity:  As above  - Impediments to fracture healing:  Osteoporosis/fragility fracture  Autoimmune disease and associated meds  - Dispo:  Therapy evals  CIR consult     Mearl Latin, PA-C [phone] (C) 12/18/2021, 9:00 AM  [facility] [address] [place] [address] [phone] [phone] (F)    After 5pm and on the weekends please log on to Amion, go to orthopaedics and the look under the [facility] Call for the provider(s) on call. You can also call our office at [phone] and then follow the prompts to be connected to the call team.   Patient ID: [name] [name], male   DOB: 1961/05/28, 61 y.o.   MRN: [id number]

## 2021-12-18 NOTE — Evaluation (Addendum)
Physical Therapy Evaluation Patient Details Name: [name] [name] MRN: [id number] DOB: [birth year] Today's Date: 12/18/2021  History of Present Illness  Pt is a 61 y/o male who presents to acute care PT s/p IM nailing to L hip after fall. Pt also with likely left 6th anterior rib fracture. Pt has PMH of seizure disorder, Crohn's, anxiety and depression, DM, liver cirrhosis, GERD, and HTN.  Clinical Impression  Pt agreeable to physical therapy evaluation/treatment session. Pt requiring maximal assistance for bed mobility but able to perform bed to chair transfer with minimal assistance using RW. Pt's pain most limiting factor and he required increased time to complete tasks. Pt currently presents with functional limitations secondary to impairments listed in PT problem list. Pt to benefit from skilled, acute care physical therapy interventions to maximize pt's independence level and quality of life. Recommend SNF upon discharge as pt has limited support from spouse due to her receiving chemotherapy.      Recommendations for follow up therapy are one component of a multi-disciplinary discharge planning process, led by the attending physician.  Recommendations may be updated based on patient status, additional functional criteria and insurance authorization.  Follow Up Recommendations Skilled nursing-short term rehab (<3 hours/day) Can patient physically be transported by private vehicle: Yes    Assistance Recommended at Discharge Frequent or constant Supervision/Assistance  Patient can return home with the following  A lot of help with walking and/or transfers;A lot of help with bathing/dressing/bathroom;Assist for transportation;Help with stairs or ramp for entrance    Equipment Recommendations None recommended by PT (recommending SNF)  Recommendations for Other Services       Functional Status Assessment Patient has had a recent decline in their functional status and demonstrates the ability to  make significant improvements in function in a reasonable and predictable amount of time.     Precautions / Restrictions Precautions Precautions: Fall Restrictions Weight Bearing Restrictions: Yes LLE Weight Bearing: Weight bearing as tolerated      Mobility  Bed Mobility Overal bed mobility: Needs Assistance Bed Mobility: Supine to Sit     Supine to sit: Max assist     General bed mobility comments: Pt with limited effort 2/2 severe pain and weakness. Sheet utilized that was underneath pt for scooting.    Transfers Overall transfer level: Needs assistance Equipment used: Rolling walker (2 wheels) Transfers: Sit to/from Stand, Bed to chair/wheelchair/BSC Sit to Stand: Min guard   Step pivot transfers: Min assist            Ambulation/Gait Ambulation/Gait assistance: Min assist Gait Distance (Feet): 3 Feet Assistive device: Rolling walker (2 wheels) Gait Pattern/deviations: Decreased stance time - left, Step-to pattern, Antalgic   Gait velocity interpretation: <1.31 ft/sec, indicative of household ambulator   General Gait Details: Pt instructed and performed modified three point pattern. Pt required assistance for keeping RW in appropriate position. Pt required increased time to complete steps during bed to chair transfer.  Stairs            Wheelchair Mobility    Modified Rankin (Stroke Patients Only)       Balance Overall balance assessment: Needs assistance Sitting-balance support: Bilateral upper extremity supported Sitting balance-Leahy Scale: Poor     Standing balance support: Bilateral upper extremity supported Standing balance-Leahy Scale: Poor                               Pertinent Vitals/Pain Pain Assessment Pain Assessment:  0-10 Pain Score: 6  (10/10 with mvmt) Pain Location: left hip Pain Descriptors / Indicators: Guarding, Moaning Pain Intervention(s): Limited activity within patient's tolerance, Monitored during  session, Premedicated before session, Repositioned    Home Living Family/patient expects to be discharged to:: Private residence Living Arrangements: Spouse/significant other;Children (special needs child) Available Help at Discharge: Available PRN/intermittently;Friend(s);Family (pt's spouse going through chemotherapy and unable to care for pt) Type of Home: House Home Access: Stairs to enter Entrance Stairs-Rails: Left Entrance Stairs-Number of Steps: 2 Alternate Level Stairs-Number of Steps: flight Home Layout: Two level;Able to live on main level with bedroom/bathroom Home Equipment: Shower seat - built in;Grab bars - tub/shower;Wheelchair - Publishing copy (2 wheels);Cane - single point (walking stick)      Prior Function Prior Level of Function : Independent/Modified Independent (does not drive due to seizure disorder; pt's wife takes care of errands)             Mobility Comments: Uses walking stick for longer distances and no AD within house ADLs Comments: Pt independent with basic ADL's. Pt performs own laundry. Pt does not cook his own meals that involves use of stove.     Hand Dominance   Dominant Hand: Right    Extremity/Trunk Assessment   Upper Extremity Assessment Upper Extremity Assessment: Overall WFL for tasks assessed    Lower Extremity Assessment Lower Extremity Assessment:  (2-/5 to L LE 2/2 painful weakness; R LE grossly 4-/5)       Communication   Communication: No difficulties  Cognition Arousal/Alertness: Awake/alert Behavior During Therapy: WFL for tasks assessed/performed Overall Cognitive Status: History of cognitive impairments - at baseline                                 General Comments: h/o memory issues; pt A and O x 4        General Comments General comments (skin integrity, edema, etc.): 100% on RA    Exercises General Exercises - Lower Extremity Ankle Circles/Pumps: 20 reps, Both, Supine Long Arc Quad:  Left, Seated (3 reps) Heel Slides: Right, 10 reps, Supine (5 reps performed on left) Hip ABduction/ADduction: Right, 10 reps, Supine (5 reps performed on L) Straight Leg Raises: Right, 10 reps, Supine   Assessment/Plan    PT Assessment Patient needs continued PT services  PT Problem List Decreased strength;Decreased range of motion;Decreased activity tolerance;Decreased balance;Decreased mobility;Decreased cognition;Decreased knowledge of use of DME;Pain       PT Treatment Interventions DME instruction;Gait training;Functional mobility training;Therapeutic activities;Therapeutic exercise;Balance training;Neuromuscular re-education;Patient/family education;Modalities    PT Goals (Current goals can be found in the Care Plan section)  Acute Rehab PT Goals PT Goal Formulation: With patient Time For Goal Achievement: 12/25/21 Potential to Achieve Goals:  (fair to good)    Frequency Min 3X/week     Co-evaluation               AM-PAC PT "6 Clicks" Mobility  Outcome Measure Help needed turning from your back to your side while in a flat bed without using bedrails?: Total Help needed moving from lying on your back to sitting on the side of a flat bed without using bedrails?: Total Help needed moving to and from a bed to a chair (including a wheelchair)?: A Lot Help needed standing up from a chair using your arms (e.g., wheelchair or bedside chair)?: A Little Help needed to walk in hospital room?: A Lot Help needed  climbing 3-5 steps with a railing? : Total 6 Click Score: 10    End of Session Equipment Utilized During Treatment: Gait belt Activity Tolerance: Patient limited by pain Patient left: in chair;with call bell/phone within reach;with family/visitor present Nurse Communication: Mobility status;Patient requests pain meds PT Visit Diagnosis: Unsteadiness on feet (R26.81);History of falling (Z91.81);Pain;Muscle weakness (generalized) (M62.81) Pain - Right/Left: Left Pain -  part of body: Hip    Time: 0833-0920 PT Time Calculation (min) (ACUTE ONLY): 47 min   Charges:   PT Evaluation $PT Eval Low Complexity: 1 Low PT Treatments $Therapeutic Exercise: 8-22 mins $Therapeutic Activity: 8-22 mins        Donna Bernard, PT   [facility] 12/18/2021, 11:15 AM

## 2021-12-18 NOTE — Social Work (Signed)
Please be advised that the above-named patient will require a short-term nursing home stay-anticipated 30 days or less for rehabilitation and strengthening. The plan is for return home.

## 2021-12-18 NOTE — NC FL2 (Addendum)
[place] LEVEL OF CARE SCREENING TOOL     IDENTIFICATION  Patient Name: [name] [name] Birthdate: May 01, 1961 Sex: male Admission Date (Current Location): 12/17/2021  [facility] and [place] Number:  Herbalist and Address:  [facility] [place], [place] [address], [place], [place] [address]      Provider Number: [id number]  Attending Physician Name and Address:  Nita Sells, MD  Relative Name and Phone Number:  [name] [name], [phone]    Current Level of Care: Hospital Recommended Level of Care: [place] Prior Approval Number:    Date Approved/Denied:   PASRR Number: [id number]  Discharge Plan: SNF    Current Diagnoses: Patient Active Problem List   Diagnosis Date Noted   Pathological fracture of left hip due to osteoporosis ([place]) 12/18/2021   Closed nondisplaced intertrochanteric fracture of left femur, initial encounter ([place]) 12/17/2021   Rib fracture 12/17/2021   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 05/11/2021   Liver cirrhosis secondary to NASH with liver mass     GERD (gastroesophageal reflux disease)    Pancytopenia ([place])    Osteomyelitis of jaw 12/29/2020   Acute osteomyelitis of mandible 12/26/2020   S/P inguinal hernia repair 01/14/2020   At high risk for falls    S/P shoulder surgery 03/05/2018   Excessive daytime sleepiness 02/18/2018   Crohn's disease of colon with rectal bleeding ([place]) 02/18/2018   Iron deficiency anemia due to chronic blood loss and thrombocytopenia  02/18/2018   Iron deficiency anemia due to sideropenic dysphagia 02/18/2018   Renal calculus, left 02/02/2018   History of sepsis 12/16/2017   Renal cyst, acquired, left 07/08/2017   Frequent falls 07/08/2017   Renal lesion 05/28/2017   MSSA bacteremia 05/06/2017   Depression 04/30/2016   Memory loss 04/30/2016   OSA on CPAP 10/31/2015   Nonepileptic episode ([place]) 04/25/2015   Seizure ([place]) 04/04/2013   DOE  (dyspnea on exertion) 03/12/2013   Complex partial seizure ([place]) 04/19/2012   DM2 (diabetes mellitus, type 2) ([place]) 09/25/2011   OTHER DYSPHAGIA 03/27/2009   GERD 12/07/2007   EXTERNAL HEMORRHOIDS 09/11/2007   CROHN'S DISEASE, LARGE AND SMALL INTESTINES 09/11/2007   OSTEOPOROSIS 09/11/2007    Orientation RESPIRATION BLADDER Height & Weight     Self, Time, Situation, Place  Normal Continent Weight: 158 lb 11.7 oz (72 kg) Height:  5' 11"  (180.3 cm)  BEHAVIORAL SYMPTOMS/MOOD NEUROLOGICAL BOWEL NUTRITION STATUS      Continent Diet (See DC summary)  AMBULATORY STATUS COMMUNICATION OF NEEDS Skin   Limited Assist Verbally Surgical wounds (L hip inc, L upper abdomen inc, L hand laceration, R hand abrasion)                       Personal Care Assistance Level of Assistance  Bathing, Feeding, Dressing Bathing Assistance: Maximum assistance Feeding assistance: Limited assistance Dressing Assistance: Maximum assistance     Functional Limitations Info  Sight, Hearing, Speech Sight Info: Adequate Hearing Info: Adequate Speech Info: Adequate    SPECIAL CARE FACTORS FREQUENCY  PT (By licensed PT), OT (By licensed OT)     PT Frequency: 5x week OT Frequency: 5x week            Contractures Contractures Info: Not present    Additional Factors Info  Code Status, Allergies, Insulin Sliding Scale Code Status Info: Full Allergies Info: Sulfonamide Derivatives   Z-pak (Azithromycin)   Dilaudid (Hydromorphone Hcl)   Sulfa  Antibiotics   Insulin Sliding Scale Info: Insulin Aspart (Novolog) 0-9 U 3x daily       Current Medications (12/18/2021):  This is the current hospital active medication list Current Facility-Administered Medications  Medication Dose Route Frequency Provider Last Rate Last Admin   0.9 %  sodium chloride infusion   Intravenous Continuous Nita Sells, MD 75 mL/hr at 12/18/21 1417 New Bag at 12/18/21 1417   acetaminophen (TYLENOL) tablet 650 mg  650 mg  Oral Q6H Nita Sells, MD   650 mg at 12/18/21 1249   acitretin ([name]) capsule 25 mg  25 mg Oral Daily Madueme, Elvira C, RPH   25 mg at 12/18/21 1407   Chlorhexidine Gluconate Cloth 2 % PADS 6 each  6 each Topical Daily Nita Sells, MD   6 each at 12/18/21 1330   cycloSPORINE (RESTASIS) 0.05 % ophthalmic emulsion 1 drop  1 drop Both Eyes BID PRN Orma Flaming, MD       docusate sodium (COLACE) capsule 100 mg  100 mg Oral BID Ainsley Spinner, PA-C   100 mg at 12/18/21 0851   enoxaparin (LOVENOX) injection 40 mg  40 mg Subcutaneous Q24H Ainsley Spinner, PA-C   40 mg at 12/18/21 0850   feeding supplement (ENSURE ENLIVE / ENSURE PLUS) liquid 237 mL  237 mL Oral TID BM Nita Sells, MD   237 mL at 12/18/21 1401   fesoterodine (TOVIAZ) tablet 8 mg  8 mg Oral Daily Orma Flaming, MD   8 mg at 10/62/69 4854   folic acid (FOLVITE) tablet 2 mg  2 mg Oral Daily Orma Flaming, MD   2 mg at 12/18/21 6270   hydrocortisone cream 1 % 1 Application  1 Application Topical BID PRN Orma Flaming, MD       insulin aspart (novoLOG) injection 0-9 Units  0-9 Units Subcutaneous TID WC Ainsley Spinner, PA-C   1 Units at 12/18/21 0850   lactase (LACTAID) tablet 3,000 Units  3,000 Units Oral BID Orma Flaming, MD   3,000 Units at 12/18/21 0853   lamoTRIgine (LAMICTAL) tablet 150 mg  150 mg Oral BID Orma Flaming, MD   150 mg at 12/18/21 0852   latanoprost (XALATAN) 0.005 % ophthalmic solution 1 drop  1 drop Both Eyes QHS Orma Flaming, MD   1 drop at 12/17/21 2318   lidocaine (LIDODERM) 5 % 1 patch  1 patch Transdermal Q24H Ainsley Spinner, PA-C   1 patch at 12/17/21 2304   menthol-cetylpyridinium (CEPACOL) lozenge 3 mg  1 lozenge Oral PRN Ainsley Spinner, PA-C       Or   phenol (CHLORASEPTIC) mouth spray 1 spray  1 spray Mouth/Throat PRN Ainsley Spinner, PA-C       mesalamine (LIALDA) EC tablet 2.4 g  2.4 g Oral Daily Orma Flaming, MD   2.4 g at 12/18/21 3500   methocarbamol (ROBAXIN) tablet 750 mg  750 mg  Oral TID Ainsley Spinner, PA-C   750 mg at 12/18/21 1053   Or   methocarbamol (ROBAXIN) 500 mg in dextrose 5 % 50 mL IVPB  500 mg Intravenous TID Ainsley Spinner, PA-C       methscopolamine (PAMINE FORTE) tablet 5 mg  5 mg Oral BID Orma Flaming, MD   5 mg at 12/18/21 1407   morphine (PF) 2 MG/ML injection 1 mg  1 mg Intravenous Q2H PRN Ainsley Spinner, PA-C   1 mg at 12/17/21 2304   multivitamin with minerals tablet 1 tablet  1 tablet Oral Daily Nita Sells,  MD   1 tablet at 12/18/21 1249   mupirocin ointment (BACTROBAN) 2 % 1 Application  1 Application Topical TID MaduemeLenetta Quaker, RPH   1 Application at 34/74/25 1046   ondansetron (ZOFRAN) tablet 4 mg  4 mg Oral Q6H PRN Ainsley Spinner, PA-C       Or   ondansetron [facility]) injection 4 mg  4 mg Intravenous Q6H PRN Ainsley Spinner, PA-C       oxyCODONE (Oxy IR/ROXICODONE) immediate release tablet 5-10 mg  5-10 mg Oral Q4H PRN Ainsley Spinner, PA-C   10 mg at 12/18/21 1053   pantoprazole (PROTONIX) EC tablet 40 mg  40 mg Oral BID AC Orma Flaming, MD   40 mg at 12/18/21 9563   potassium chloride SA (KLOR-CON M) CR tablet 20 mEq  20 mEq Oral BID Orma Flaming, MD   20 mEq at 12/18/21 0851   rifaximin (XIFAXAN) tablet 550 mg  550 mg Oral BID Orma Flaming, MD   550 mg at 12/18/21 8756   sertraline (ZOLOFT) tablet 200 mg  200 mg Oral Q breakfast Orma Flaming, MD   200 mg at 12/18/21 4332   sodium chloride flush (NS) 0.9 % injection 10-40 mL  10-40 mL Intracatheter Q12H Samtani, Jai-Gurmukh, MD       sodium chloride flush (NS) 0.9 % injection 10-40 mL  10-40 mL Intracatheter PRN Nita Sells, MD       tamsulosin (FLOMAX) capsule 0.4 mg  0.4 mg Oral QHS Orma Flaming, MD   0.4 mg at 12/17/21 2312   traZODone (DESYREL) tablet 150 mg  150 mg Oral QHS Orma Flaming, MD   150 mg at 12/17/21 2313   zolpidem (AMBIEN) tablet 5 mg  5 mg Oral QHS PRN,MR X 1 Orma Flaming, MD       Facility-Administered Medications Ordered in Other Encounters  Medication  Dose Route Frequency Provider Last Rate Last Admin   gadopentetate dimeglumine (MAGNEVIST) injection 20 mL  20 mL Intravenous Once PRN Marcial Pacas, MD         Discharge Medications: Please see discharge summary for a list of discharge medications.  Relevant Imaging Results:  Relevant Lab Results:   Additional Information SS# [id number] [place]

## 2021-12-19 ENCOUNTER — Ambulatory Visit: Payer: Medicare Other | Admitting: Physical Therapy

## 2021-12-19 DIAGNOSIS — E43 Unspecified severe protein-calorie malnutrition: Secondary | ICD-10-CM | POA: Insufficient documentation

## 2021-12-19 DIAGNOSIS — S72145A Nondisplaced intertrochanteric fracture of left femur, initial encounter for closed fracture: Secondary | ICD-10-CM | POA: Diagnosis not present

## 2021-12-19 LAB — CBC
HCT: 23.1 % — ABNORMAL LOW (ref 39.0–52.0)
Hemoglobin: 7.8 g/dL — ABNORMAL LOW (ref 13.0–17.0)
MCH: 32.4 pg (ref 26.0–34.0)
MCHC: 33.8 g/dL (ref 30.0–36.0)
MCV: 95.9 fL (ref 80.0–100.0)
Platelets: 71 10*3/uL — ABNORMAL LOW (ref 150–400)
RBC: 2.41 MIL/uL — ABNORMAL LOW (ref 4.22–5.81)
RDW: 14 % (ref 11.5–15.5)
WBC: 5.8 10*3/uL (ref 4.0–10.5)
nRBC: 0 % (ref 0.0–0.2)

## 2021-12-19 LAB — GLUCOSE, CAPILLARY
Glucose-Capillary: 97 mg/dL (ref 70–99)
Glucose-Capillary: 110 mg/dL — ABNORMAL HIGH (ref 70–99)
Glucose-Capillary: 74 mg/dL (ref 70–99)
Glucose-Capillary: 95 mg/dL (ref 70–99)

## 2021-12-19 LAB — BASIC METABOLIC PANEL
Anion gap: 8 (ref 5–15)
BUN: 19 mg/dL (ref 6–20)
CO2: 24 mmol/L (ref 22–32)
Calcium: 8.7 mg/dL — ABNORMAL LOW (ref 8.9–10.3)
Chloride: 107 mmol/L (ref 98–111)
Creatinine, Ser: 0.95 mg/dL (ref 0.61–1.24)
GFR, Estimated: >60 mL/min (ref >60)
Glucose, Bld: 96 mg/dL (ref 70–99)
Potassium: 3.7 mmol/L (ref 3.5–5.1)
Sodium: 139 mmol/L (ref 135–145)

## 2021-12-19 LAB — HEMOGLOBIN AND HEMATOCRIT, BLOOD
HCT: 24.9 % — ABNORMAL LOW (ref 39.0–52.0)
Hemoglobin: 8.1 g/dL — ABNORMAL LOW (ref 13.0–17.0)

## 2021-12-19 LAB — SURGICAL PATHOLOGY

## 2021-12-19 MED ORDER — ACETAMINOPHEN 500 MG PO TABS
1000.0000 mg | ORAL_TABLET | Freq: Three times a day (TID) | ORAL | Status: DC
  Administered 2021-12-19 – 2021-12-24 (×15): 1000 mg via ORAL
  Filled 2021-12-19 (×15): qty 2

## 2021-12-19 NOTE — Progress Notes (Addendum)
Physical Therapy Treatment Patient Details Name: [name] [name] MRN: [id number] DOB: 03/21/1961 Today's Date: 12/19/2021   History of Present Illness Pt is a 61 y/o male who presents to acute care PT s/p IM nailing to L hip after fall. Pt has PMH of seizure disorder, Crohn's, anxiety and depression, DM, liver cirrhosis, GERD, and HTN.    PT Comments    Pt instructed in and performed gait training and therapeutic exercises. Pt able to ambulate increased distance with RW although limited by pain and fatigue. Pt also able to perform exercises for more repetitions on L LE but unable to perform SLR. Pt's cognition limiting carryover of instructions. Progress as tolerated.    Recommendations for follow up therapy are one component of a multi-disciplinary discharge planning process, led by the attending physician.  Recommendations may be updated based on patient status, additional functional criteria and insurance authorization.  Follow Up Recommendations  Skilled nursing-short term rehab (<3 hours/day) Can patient physically be transported by private vehicle: Yes   Assistance Recommended at Discharge Frequent or constant Supervision/Assistance  Patient can return home with the following A lot of help with walking and/or transfers;A lot of help with bathing/dressing/bathroom;Assist for transportation;Help with stairs or ramp for entrance   Equipment Recommendations  None recommended by PT (recommending SNF)    Recommendations for Other Services       Precautions / Restrictions Precautions Precautions: Fall Restrictions Weight Bearing Restrictions: Yes LLE Weight Bearing: Weight bearing as tolerated     Mobility  Bed Mobility Overal bed mobility: Needs Assistance Bed Mobility: Supine to Sit, Sit to Supine     Supine to sit: Mod assist     General bed mobility comments: Pt required use of bed rail and HHA for supine to sit and therapist assist for his LE's on sit to supine.     Transfers Overall transfer level: Needs assistance Equipment used: Rolling walker (2 wheels) Transfers: Sit to/from Stand Sit to Stand: Min assist, Mod assist   Step pivot transfers: Min assist       General transfer comment: Pt required minimal assistance with sit to stands from bed and moderate assistance from chair. Pt also performed step pivot transfer from chair to bed following gait.    Ambulation/Gait Ambulation/Gait assistance: Min assist Gait Distance (Feet): 28 Feet Assistive device: Rolling walker (2 wheels) Gait Pattern/deviations: Decreased stance time - left, Step-to pattern, Antalgic, Decreased step length - right, Decreased step length - left   Gait velocity interpretation: <1.31 ft/sec, indicative of household ambulator   General Gait Details: Pt instructed and performed modified three point pattern. Pt with poor weight acceptance on L LE especially as distance increased. Pt with slow speed and required cues to avoid pushing RW too far in front of him but unable to correct fully. Chair followed pt which he required in hallway.   Stairs             Wheelchair Mobility    Modified Rankin (Stroke Patients Only)       Balance Overall balance assessment: Needs assistance Sitting-balance support: Bilateral upper extremity supported Sitting balance-Leahy Scale: Poor     Standing balance support: Bilateral upper extremity supported Standing balance-Leahy Scale: Poor                              Cognition Arousal/Alertness: Awake/alert Behavior During Therapy: WFL for tasks assessed/performed Overall Cognitive Status: History of cognitive impairments - at baseline  General Comments: h/o memory issues; pt with decreased ability to follow commands appropriately and slow processing present        Exercises General Exercises - Lower Extremity Long Arc Quad: Left, Seated, 10 reps Heel Slides:  10 reps, Supine, Left (with UE assist) Hip ABduction/ADduction: 10 reps, Supine, Left Straight Leg Raises:  (unable to complete 1 rep on left)    General Comments General comments (skin integrity, edema, etc.): HR and SpO2 stable on RA      Pertinent Vitals/Pain Pain Assessment Pain Assessment: 0-10 Pain Score: 6  Pain Location: left hip Pain Descriptors / Indicators: Guarding, Moaning Pain Intervention(s): Limited activity within patient's tolerance, Monitored during session, Premedicated before session, Ice applied    Home Living                          Prior Function            PT Goals (current goals can now be found in the care plan section) Acute Rehab PT Goals PT Goal Formulation: With patient Time For Goal Achievement: 12/25/21 Potential to Achieve Goals:  (fair to good)    Frequency    Min 3X/week      PT Plan Current plan remains appropriate    Co-evaluation              AM-PAC PT "6 Clicks" Mobility   Outcome Measure  Help needed turning from your back to your side while in a flat bed without using bedrails?: A Lot Help needed moving from lying on your back to sitting on the side of a flat bed without using bedrails?: A Lot Help needed moving to and from a bed to a chair (including a wheelchair)?: A Lot Help needed standing up from a chair using your arms (e.g., wheelchair or bedside chair)?: A Little Help needed to walk in hospital room?: A Little Help needed climbing 3-5 steps with a railing? : A Lot 6 Click Score: 14    End of Session Equipment Utilized During Treatment: Gait belt Activity Tolerance: Patient limited by pain Patient left: in bed;with bed alarm set;with call bell/phone within reach Nurse Communication: Mobility status;Patient requests pain meds PT Visit Diagnosis: Unsteadiness on feet (R26.81);History of falling (Z91.81);Pain;Muscle weakness (generalized) (M62.81) Pain - Right/Left: Left Pain - part of body:  Hip     Time: 0826-0900 PT Time Calculation (min) (ACUTE ONLY): 34 min  Charges:  $Gait Training: 8-22 mins $Therapeutic Exercise: 8-22 mins                     Donna Bernard, PT    [facility] 12/19/2021, 9:22 AM

## 2021-12-19 NOTE — Progress Notes (Signed)
PROGRESS NOTE   GLENARD KEESLING  ZLD:[id number] DOB: 1960-12-27 DOA: 12/17/2021 PCP: [name], MD    Brief Narrative:  61 year old white male, known history DM TY 2, seizures who became dizzy outside his home on 6/26 tripped on a curb fell onto left hip, developed a contusion under right eye Found to have left hip fracture in addition to nondisplaced left sixth rib fracture. He is s/p repair on 6/26.  Hospital-Problem based course  Left hip fracture -Status post IM nail left hip 6/26 Dr. Marcelino Scot -drop in h/h so lovenox held, recheck h/h this PM -pain control -incentive spirometry  -no DVT  Prior diagnosis of squamous cell CA right cheek + XRT status post Libtayo  Interval surveillance at this time-consideration as an outpatient to continue Libtayo versus not given underlying Crohn's disease which can worsen from this medication  follow-up with Dr. Salley Scarlet of dermatology and ENT Dr. Barton Dubois--- continue [name] 25 daily Seems to be maintained on dysphagia 3 diet  Anemia of expected blood loss from surgery -recheck h/h this PM -transfuse if lower  AKI secondary to perioperative state  -hold IVF  -Cr improved  History of complex partial seizures Continue Lamictal 150 twice daily  DM TY 2 -SSI  Crohn's disease followed by Dr. Fuller Plan Continue Lialda 2.4 daily, methscopolamine 5 mg twice daily Continue lactase 3000 twice daily Continue Protonix 40 twice daily  BPH Continue Detrol LA 8 daily, Flomax 0.4  Liver cirrhosis with mass No ascites AFP 38 Undergoing work-up and was to be seen 12/12/2021 for liver directed therapies Outpatient follow-up with oncology Continue rifampin 550 twice daily for presumed prevention of hepatic encephalopathy  Depression Follow-up as an outpatient with Dr. Darleene Cleaver continue trazodone 150 at bedtime sertraline 1 200 every morning   DVT prophylaxis: scd Code Status: Full code Family Communication: at bedside Disposition:  Status is:  Inpatient Remains inpatient appropriate because:   Trend h/h, await SNF   Consultants:  orthopedics   Procedures: IM nail left hip 6/26   Subjective:  C/o pain but is very sleepy  Objective: Vitals:   12/18/21 1715 12/18/21 2106 12/19/21 0352 12/19/21 0737  BP: (!) 114/59 95/80 (!) 93/53 93/68  Pulse: 84 87 92 91  Resp: 17 15 15    Temp: 98.1 F (36.7 C) 98.3 F (36.8 C)  98.6 F (37 C)  TempSrc: Oral   Oral  SpO2: 98% 93% 95% 94%  Weight:      Height:        Intake/Output Summary (Last 24 hours) at 12/19/2021 1111 Last data filed at 12/19/2021 0022 Gross per 24 hour  Intake 284.19 ml  Output 300 ml  Net -15.81 ml   Filed Weights   12/17/21 1053 12/17/21 1419  Weight: 72 kg 72 kg    Examination:   General: Appearance:    Chronically ill appearing male in no acute distress     Lungs:     respirations unlabored  Heart:    Normal heart rate.   MS:   All extremities are intact.   Neurologic:   Will awaken and answer questions     Data Reviewed: personally reviewed   CBC    Component Value Date/Time   WBC 5.8 12/19/2021 0305   RBC 2.41 (L) 12/19/2021 0305   HGB 7.8 (L) 12/19/2021 0305   HGB 9.7 (L) 07/12/2021 1118   HGB 12.3 (L) 07/12/2019 0900   HCT 23.1 (L) 12/19/2021 0305   HCT 34.6 (L) 07/12/2019 0900   PLT  71 (L) 12/19/2021 0305   PLT 118 (L) 07/12/2021 1118   PLT 90 (LL) 07/12/2019 0900   MCV 95.9 12/19/2021 0305   MCV 108 (H) 07/12/2019 0900   MCH 32.4 12/19/2021 0305   MCHC 33.8 12/19/2021 0305   RDW 14.0 12/19/2021 0305   RDW 14.8 07/12/2019 0900   LYMPHSABS 0.5 (L) 12/17/2021 1102   LYMPHSABS 0.6 (L) 12/28/2018 1350   MONOABS 0.5 12/17/2021 1102   EOSABS 0.2 12/17/2021 1102   EOSABS 0.1 12/28/2018 1350   BASOSABS 0.0 12/17/2021 1102   BASOSABS 0.0 12/28/2018 1350      Latest Ref Rng & Units 12/19/2021    3:05 AM 12/18/2021    4:04 AM 12/17/2021   11:02 AM  CMP  Glucose 70 - 99 mg/dL 96  180  83   BUN 6 - 20 mg/dL 19  16  14     Creatinine 0.61 - 1.24 mg/dL 0.95  1.25  1.19   Sodium 135 - 145 mmol/L 139  138  143   Potassium 3.5 - 5.1 mmol/L 3.7  3.9  3.7   Chloride 98 - 111 mmol/L 107  107  113   CO2 22 - 32 mmol/L 24  22  22    Calcium 8.9 - 10.3 mg/dL 8.7  8.7  9.3   Total Protein 6.5 - 8.1 g/dL   6.0   Total Bilirubin 0.3 - 1.2 mg/dL   1.8   Alkaline Phos 38 - 126 U/L   126   AST 15 - 41 U/L   30   ALT 0 - 44 U/L   19      Radiology Studies: VAS [place] LOWER EXTREMITY VENOUS (DVT)  Result Date: 12/18/2021  Lower Venous DVT Study Patient Name:  SHANNAN GARFINKEL  Date of Exam:   12/18/2021 Medical Rec #: [id number]      Accession #:    [id number] Date of Birth: 1961/03/18      Patient Gender: M Patient Age:   59 years Exam Location:  [facility] Procedure:      VAS [place] LOWER EXTREMITY VENOUS (DVT) Referring Phys: Ainsley Spinner --------------------------------------------------------------------------------  Indications: Pain, and Edema.  Risk Factors: Left hip fracture. Comparison Study: No prior study Performing Technologist: Maudry Mayhew MHA, RDMS, RVT, RDCS  Examination Guidelines: A complete evaluation includes B-mode imaging, spectral Doppler, color Doppler, and power Doppler as needed of all accessible portions of each vessel. Bilateral testing is considered an integral part of a complete examination. Limited examinations for reoccurring indications may be performed as noted. The reflux portion of the exam is performed with the patient in reverse Trendelenburg.  +-----+---------------+---------+-----------+----------+--------------+ RIGHTCompressibilityPhasicitySpontaneityPropertiesThrombus Aging +-----+---------------+---------+-----------+----------+--------------+ CFV  Full           Yes      Yes                                 +-----+---------------+---------+-----------+----------+--------------+   +---------+---------------+---------+-----------+----------+--------------+ LEFT      CompressibilityPhasicitySpontaneityPropertiesThrombus Aging +---------+---------------+---------+-----------+----------+--------------+ CFV      Full           Yes      Yes                                 +---------+---------------+---------+-----------+----------+--------------+ SFJ      Full                                                        +---------+---------------+---------+-----------+----------+--------------+  FV Prox  Full                                                        +---------+---------------+---------+-----------+----------+--------------+ FV Mid   Full                                                        +---------+---------------+---------+-----------+----------+--------------+ FV DistalFull                                                        +---------+---------------+---------+-----------+----------+--------------+ PFV      Full                                                        +---------+---------------+---------+-----------+----------+--------------+ POP      Full           Yes      Yes                                 +---------+---------------+---------+-----------+----------+--------------+ PTV      Full                                                        +---------+---------------+---------+-----------+----------+--------------+   Left Technical Findings: Not visualized segments include peroneal veins.   Summary: RIGHT: - No evidence of common femoral vein obstruction.  LEFT: - There is no evidence of deep vein thrombosis in the lower extremity. However, portions of this examination were limited- see technologist comments above.  - No cystic structure found in the popliteal fossa.  *See table(s) above for measurements and observations. Electronically signed by Harold Barban MD on 12/18/2021 at 10:35:32 PM.    Final    DG Hip Port Unilat With Pelvis 1V Left  Result Date: 12/17/2021 CLINICAL DATA:  Fall.   Postoperative left hip arthroplasty EXAM: DG HIP (WITH OR WITHOUT PELVIS) 1V PORT LEFT COMPARISON:  Same-day intraoperative fluoroscopic images. FINDINGS: Patient is status post ORIF of the proximal left femur with short intramedullary rod, distal interlocking screw, and proximal lag screw. Fracture alignment is near anatomic. No new fracture identified. Hardware intact. Degenerative changes of both hips. No dislocation. Expected postoperative changes within the soft tissues lateral to the left hip. IMPRESSION: Status post ORIF of the proximal left femur without apparent postoperative complication. Electronically Signed   By: Davina Poke D.O.   On: 12/17/2021 19:28   DG FEMUR MIN 2 VIEWS LEFT  Result Date: 12/17/2021 CLINICAL DATA:  Left hip ORIF, intraoperative examination EXAM: LEFT FEMUR 2 VIEWS COMPARISON:  12/17/2021 FINDINGS: Left hip ORIF utilizing a gamma nail  has been performed with fracture fragments in near anatomic alignment on this limited examination. No unexpected fracture or dislocation. Moderate left hip degenerative arthritis is incidentally noted. Surgical skin staples are noted. Fluoroscopy time: 51 seconds Fluoroscopic images: 4 Fluoroscopic dose: 8.0 mGy IMPRESSION: Status post left hip ORIF. Electronically Signed   By: Fidela Salisbury M.D.   On: 12/17/2021 19:14   DG C-Arm 1-60 Min-No Report  Result Date: 12/17/2021 Fluoroscopy was utilized by the requesting physician.  No radiographic interpretation.   CT Cervical Spine Wo Contrast  Result Date: 12/17/2021 CLINICAL DATA:  Trip and fall on a cement curb. EXAM: CT HEAD WITHOUT CONTRAST CT MAXILLOFACIAL WITHOUT CONTRAST CT CERVICAL SPINE WITHOUT CONTRAST TECHNIQUE: Multidetector CT imaging of the head, cervical spine, and maxillofacial structures were performed using the standard protocol without intravenous contrast. Multiplanar CT image reconstructions of the cervical spine and maxillofacial structures were also generated.  RADIATION DOSE REDUCTION: This exam was performed according to the departmental dose-optimization program which includes automated exposure control, adjustment of the mA and/or kV according to patient size and/or use of iterative reconstruction technique. COMPARISON:  CT head dated January 14, 2021 FINDINGS: CT HEAD FINDINGS Brain: No evidence of acute infarction, hemorrhage, hydrocephalus, extra-axial collection or mass lesion/mass effect. Mild cerebral volume loss. Vascular: No hyperdense vessel or unexpected calcification. Skull: Normal. Negative for fracture or focal lesion. Other: None CT MAXILLOFACIAL FINDINGS Osseous: No fracture or mandibular dislocation. No destructive process. Orbits: Negative. No traumatic or inflammatory finding. Bilateral cataract surgery. Sinuses: Clear. Soft tissues: Negative. CT CERVICAL SPINE FINDINGS Alignment: Mild anterolisthesis of C6. Skull base and vertebrae: No acute fracture. No primary bone lesion or focal pathologic process. Soft tissues and spinal canal: No prevertebral fluid or swelling. No visible canal hematoma. Disc levels: Multilevel degenerate disc disease with mild spinal canal stenosis and mild bilateral neural foraminal stenosis at C4-C5. Prominent bulky anterior osteophytes. Upper chest: Negative. Other: None IMPRESSION: 1.  No CT evidence of acute intracranial abnormality. 2.  Mild cerebral volume loss. 3.  No acute cervical spine fracture or traumatic subluxation. 4. Moderate multilevel degenerate disc disease of the cervical spine with prominent anterior osteophytes. Mild spinal canal and neural foraminal stenosis at C4-C5. 5.  No maxillofacial fracture or significant soft tissue injury. Electronically Signed   By: Keane Police D.O.   On: 12/17/2021 12:33   CT Head Wo Contrast  Result Date: 12/17/2021 CLINICAL DATA:  Trip and fall on a cement curb. EXAM: CT HEAD WITHOUT CONTRAST CT MAXILLOFACIAL WITHOUT CONTRAST CT CERVICAL SPINE WITHOUT CONTRAST TECHNIQUE:  Multidetector CT imaging of the head, cervical spine, and maxillofacial structures were performed using the standard protocol without intravenous contrast. Multiplanar CT image reconstructions of the cervical spine and maxillofacial structures were also generated. RADIATION DOSE REDUCTION: This exam was performed according to the departmental dose-optimization program which includes automated exposure control, adjustment of the mA and/or kV according to patient size and/or use of iterative reconstruction technique. COMPARISON:  CT head dated January 14, 2021 FINDINGS: CT HEAD FINDINGS Brain: No evidence of acute infarction, hemorrhage, hydrocephalus, extra-axial collection or mass lesion/mass effect. Mild cerebral volume loss. Vascular: No hyperdense vessel or unexpected calcification. Skull: Normal. Negative for fracture or focal lesion. Other: None CT MAXILLOFACIAL FINDINGS Osseous: No fracture or mandibular dislocation. No destructive process. Orbits: Negative. No traumatic or inflammatory finding. Bilateral cataract surgery. Sinuses: Clear. Soft tissues: Negative. CT CERVICAL SPINE FINDINGS Alignment: Mild anterolisthesis of C6. Skull base and vertebrae: No acute fracture. No primary bone  lesion or focal pathologic process. Soft tissues and spinal canal: No prevertebral fluid or swelling. No visible canal hematoma. Disc levels: Multilevel degenerate disc disease with mild spinal canal stenosis and mild bilateral neural foraminal stenosis at C4-C5. Prominent bulky anterior osteophytes. Upper chest: Negative. Other: None IMPRESSION: 1.  No CT evidence of acute intracranial abnormality. 2.  Mild cerebral volume loss. 3.  No acute cervical spine fracture or traumatic subluxation. 4. Moderate multilevel degenerate disc disease of the cervical spine with prominent anterior osteophytes. Mild spinal canal and neural foraminal stenosis at C4-C5. 5.  No maxillofacial fracture or significant soft tissue injury. Electronically  Signed   By: Keane Police D.O.   On: 12/17/2021 12:33   CT Maxillofacial Wo Contrast  Result Date: 12/17/2021 CLINICAL DATA:  Trip and fall on a cement curb. EXAM: CT HEAD WITHOUT CONTRAST CT MAXILLOFACIAL WITHOUT CONTRAST CT CERVICAL SPINE WITHOUT CONTRAST TECHNIQUE: Multidetector CT imaging of the head, cervical spine, and maxillofacial structures were performed using the standard protocol without intravenous contrast. Multiplanar CT image reconstructions of the cervical spine and maxillofacial structures were also generated. RADIATION DOSE REDUCTION: This exam was performed according to the departmental dose-optimization program which includes automated exposure control, adjustment of the mA and/or kV according to patient size and/or use of iterative reconstruction technique. COMPARISON:  CT head dated January 14, 2021 FINDINGS: CT HEAD FINDINGS Brain: No evidence of acute infarction, hemorrhage, hydrocephalus, extra-axial collection or mass lesion/mass effect. Mild cerebral volume loss. Vascular: No hyperdense vessel or unexpected calcification. Skull: Normal. Negative for fracture or focal lesion. Other: None CT MAXILLOFACIAL FINDINGS Osseous: No fracture or mandibular dislocation. No destructive process. Orbits: Negative. No traumatic or inflammatory finding. Bilateral cataract surgery. Sinuses: Clear. Soft tissues: Negative. CT CERVICAL SPINE FINDINGS Alignment: Mild anterolisthesis of C6. Skull base and vertebrae: No acute fracture. No primary bone lesion or focal pathologic process. Soft tissues and spinal canal: No prevertebral fluid or swelling. No visible canal hematoma. Disc levels: Multilevel degenerate disc disease with mild spinal canal stenosis and mild bilateral neural foraminal stenosis at C4-C5. Prominent bulky anterior osteophytes. Upper chest: Negative. Other: None IMPRESSION: 1.  No CT evidence of acute intracranial abnormality. 2.  Mild cerebral volume loss. 3.  No acute cervical spine  fracture or traumatic subluxation. 4. Moderate multilevel degenerate disc disease of the cervical spine with prominent anterior osteophytes. Mild spinal canal and neural foraminal stenosis at C4-C5. 5.  No maxillofacial fracture or significant soft tissue injury. Electronically Signed   By: Keane Police D.O.   On: 12/17/2021 12:33   DG Hip Unilat W or Wo Pelvis 2-3 Views Left  Result Date: 12/17/2021 CLINICAL DATA:  [place]. Left hip pain. EXAM: DG HIP (WITH OR WITHOUT PELVIS) 2-3V LEFT COMPARISON:  None Available. FINDINGS: Mildly displaced intertrochanteric fracture of the left hip. The right hip is intact. The bony pelvis is intact. The pubic symphysis and SI joints appear normal. IMPRESSION: Mildly displaced intertrochanteric fracture of the left hip. Electronically Signed   By: Marijo Sanes M.D.   On: 12/17/2021 11:53   DG Ribs Unilateral W/Chest Left  Result Date: 12/17/2021 CLINICAL DATA:  [place]. Left rib pain. EXAM: LEFT RIBS AND CHEST - 3+ VIEW COMPARISON:  Chest x-ray 01/14/2021 FINDINGS: The cardiac silhouette, mediastinal and hilar contours are within normal limits. No infiltrates, effusions or pneumothorax. Remote healed right rib fractures are noted. Dedicated views of the left ribs are limited by overlying EKG wires. Suspect remote healed rib fractures and an acute nondisplaced  sixth anterior rib fracture. IMPRESSION: 1. No acute cardiopulmonary findings. 2. Suspect nondisplaced left sixth anterior rib fracture. Electronically Signed   By: Marijo Sanes M.D.   On: 12/17/2021 11:52     Scheduled Meds:  acetaminophen  1,000 mg Oral TID   acitretin  25 mg Oral Daily   Chlorhexidine Gluconate Cloth  6 each Topical Daily   docusate sodium  100 mg Oral BID   feeding supplement  237 mL Oral TID BM   fesoterodine  8 mg Oral Daily   folic acid  2 mg Oral Daily   insulin aspart  0-9 Units Subcutaneous TID WC   lactase  3,000 Units Oral BID   lamoTRIgine  150 mg Oral BID   latanoprost  1 drop  Both Eyes QHS   lidocaine  1 patch Transdermal Q24H   mesalamine  2.4 g Oral Daily   methocarbamol  750 mg Oral TID   methscopolamine  5 mg Oral BID   multivitamin with minerals  1 tablet Oral Daily   mupirocin ointment  1 Application Topical TID   pantoprazole  40 mg Oral BID AC   potassium chloride SA  20 mEq Oral BID   rifaximin  550 mg Oral BID   sertraline  200 mg Oral Q breakfast   sodium chloride flush  10-40 mL Intracatheter Q12H   tamsulosin  0.4 mg Oral QHS   traZODone  150 mg Oral QHS   Continuous Infusions:  methocarbamol (ROBAXIN) IV       LOS: 2 days   Time spent: [place], DO [facility]   12/19/2021, 11:11 AM

## 2021-12-19 NOTE — Plan of Care (Signed)
  Problem: Fluid Volume: Goal: Ability to maintain a balanced intake and output will improve Outcome: Progressing   Problem: Metabolic: Goal: Ability to maintain appropriate glucose levels will improve Outcome: Progressing   Problem: Nutritional: Goal: Maintenance of adequate nutrition will improve Outcome: Progressing Goal: Progress toward achieving an optimal weight will improve Outcome: Progressing   Problem: Skin Integrity: Goal: Risk for impaired skin integrity will decrease Outcome: Progressing   Problem: Education: Goal: Knowledge of General Education information will improve Description: Including pain rating scale, medication(s)/side effects and non-pharmacologic comfort measures Outcome: Progressing   Problem: Nutrition: Goal: Adequate nutrition will be maintained Outcome: Progressing   Problem: Coping: Goal: Level of anxiety will decrease Outcome: Progressing   Problem: Pain Managment: Goal: General experience of comfort will improve Outcome: Progressing   Problem: Safety: Goal: Ability to remain free from injury will improve Outcome: Progressing   Problem: Skin Integrity: Goal: Risk for impaired skin integrity will decrease Outcome: Progressing

## 2021-12-19 NOTE — Progress Notes (Signed)
Transition of Care [facility]) - CAGE-AID Screening   Patient Details  Name: [name] [name] MRN: [id number] Date of Birth: 1960-10-12  Elvina Sidle, RN Trauma Response Nurse Phone Number: [phone] 12/19/2021, 10:48 AM      CAGE-AID Screening:              Substance Abuse Education Offered: No (pt denies alcohol or drug use)

## 2021-12-19 NOTE — Evaluation (Signed)
Clinical/Bedside Swallow Evaluation Patient Details  Name: DEAUNDRE ALLSTON MRN: [id number] Date of Birth: 1960-07-12  Today's Date: 12/19/2021 Time: SLP Start Time (ACUTE ONLY): 1325 SLP Stop Time (ACUTE ONLY): 1339 SLP Time Calculation (min) (ACUTE ONLY): 14 min  Past Medical History:  Past Medical History:  Diagnosis Date   Adenomatous colon polyp 10/1999   Anxiety    Arthritis    neck, yoga helps.   Cataract    Complex partial seizure ([place])    last seizure was 08-30-2018   Crohn's disease of small and large intestines ([place]) 1999   Depression    Diabetes mellitus    no meds at this time 10-24-17   Esophageal stricture    External hemorrhoids    GERD (gastroesophageal reflux disease)    Glaucoma    History of kidney stones    3 large stones still present   Hyperlipemia    Hypertension    resolved with weight loss    Inguinal hernia, bilateral 12/2019   Liver cirrhosis secondary to NASH [facility])    Migraines    Osteoporosis    Pathological fracture of left hip due to osteoporosis ([place]) 12/18/2021   PONV (postoperative nausea and vomiting)    PTSD (post-traumatic stress disorder)    Renal cyst, acquired, left 07/08/2017   Skin cancer    squamous cell multiple; [place]; followed every 3 months.   Sleep apnea    CPAP machine, uses nightly   Staphylococcus aureus bacteremia with sepsis ([place]) 05/28/2017   MRSA 2012   Past Surgical History:  Past Surgical History:  Procedure Laterality Date   CATARACT EXTRACTION Bilateral    COLONOSCOPY     DEBRIDEMENT MANDIBLE N/A 12/07/2020   Procedure: DEBRIDEMENT OF MANDIBLE;  Surgeon: [name], DMD;  Location: [place];  Service: Oral Surgery;  Laterality: N/A;   ELBOW SURGERY  2012   elbow MRSA infection    EYE SURGERY     cataracts removed, /w IOL   GROSS TEETH DEBRIDEMENT N/A 12/29/2020   Procedure: MANDIBULAR  DEBRIDEMENT;  Surgeon: [name], DMD;  Location: [place];  Service: Dentistry;  Laterality: N/A;   INCISION  AND DRAINAGE ABSCESS N/A 12/07/2020   Procedure: INCISION AND DRAINAGE MANDIBULAR ABSCESS;  Surgeon: [name], DMD;  Location: [place];  Service: Oral Surgery;  Laterality: N/A;   INGUINAL HERNIA REPAIR Right 01/14/2020   Procedure: OPEN RIGHT HERNIA REPAIR INGUINAL WITH MESH;  Surgeon: [name], MD;  Location: [facility];  Service: General;  Laterality: Right;   INSERTION OF MESH N/A 07/01/2016   Procedure: INSERTION OF MESH;  Surgeon: [name], MD;  Location: [place];  Service: General;  Laterality: N/A;   INTRAMEDULLARY (IM) NAIL INTERTROCHANTERIC Left 12/17/2021   Procedure: INTRAMEDULLARY (IM) NAIL INTERTROCHANTRIC;  Surgeon: Altamese Graford, MD;  Location: [place];  Service: Orthopedics;  Laterality: Left;   IR URETERAL STENT LEFT NEW ACCESS W/O SEP NEPHROSTOMY CATH  02/02/2018   NEPHROLITHOTOMY Left 02/02/2018   Procedure: NEPHROLITHOTOMY PERCUTANEOUS;  Surgeon: [name], MD;  Location: [facility];  Service: Urology;  Laterality: Left;   POLYPECTOMY     SCALP LACERATION REPAIR Right 10/16/2017   From fall/staples   SHOULDER ARTHROSCOPY Right 03/05/2018   Procedure: ARTHROSCOPY SHOULDER AND OPEN DISTAL CLAVICLE EXCISION;  Surgeon: [name], MD;  Location: [place];  Service: Orthopedics;  Laterality: Right;   SHOULDER SURGERY     SINUS SURGERY WITH INSTATRAK     TEE WITHOUT CARDIOVERSION N/A 05/09/2017  Procedure: TRANSESOPHAGEAL ECHOCARDIOGRAM (TEE);  Surgeon: [name], MD;  [facility];  Service: Cardiovascular;  Laterality: N/A;   TOOTH EXTRACTION N/A 12/07/2020   Procedure: DENTAL EXTRACTIONS OF TEETH TWENTY TO THIRTY;  Surgeon: [name], DMD;  Location: [place];  Service: Oral Surgery;  Laterality: N/A;   TOOTH EXTRACTION N/A 12/29/2020   Procedure: DENTAL RESTORATION/EXTRACTIONS;  Surgeon: [name], DMD;  Location: [place];  Service: Dentistry;  Laterality: N/A;   TRANSTHORACIC ECHOCARDIOGRAM  02/22/2011   EF 55-65%; increased pattern of  LVH with mild conc hypertrophy, abnormal relaxation & increased filling pressure (grade 2 diastolic dysfunction); atrial septum thickened (lipomatous hypertrophy)   UMBILICAL HERNIA REPAIR N/A 07/01/2016   Procedure: UMBILICAL HERNIA REPAIR WITH MESH;  Surgeon: [name], MD;  Location: [place];  Service: General;  Laterality: N/A;   UPPER GASTROINTESTINAL ENDOSCOPY     VASECTOMY     HPI:  61 year old white male, known history DM TY 2, seizures who became dizzy outside his home on 6/26 tripped on a curb fell onto left hip, developed a contusion under right eye  Found to have left hip fracture in addition to nondisplaced left sixth rib fracture. He is s/p repair on 6/26.    Assessment / Plan / Recommendation  Clinical Impression  Pt typically consumes very soft solids due to missing dentition. He additionally has facial nerve paresis on right from various skin chancer treatments. Wife also reports a history of [name] that has been suspected to impact pts swallowing. Today pt does have some intermittent throat clearing with liquids. Given multiple risk factors and upcoming d/c to rehab facility, recommend MBS for safest diet recommendations during rehabilitation to reduce risk of aspiration in decompensated hip fx pt. Will completed MBS at earliest availability tomorrow so as not to interfere with DC.  SLP Visit Diagnosis: Dysphagia, unspecified (R13.10)    Aspiration Risk  Mild aspiration risk    Diet Recommendation Dysphagia 3 (Mech soft);Thin liquid   Liquid Administration via: Straw;Cup Medication Administration: Whole meds with liquid Supervision: Patient able to self feed Compensations: Slow rate;Small sips/bites Postural Changes: Seated upright at 90 degrees    Other  Recommendations      Recommendations for follow up therapy are one component of a multi-disciplinary discharge planning process, led by the attending physician.  Recommendations may be updated based on patient status,  additional functional criteria and insurance authorization.  Follow up Recommendations        Assistance Recommended at Discharge    Functional Status Assessment    Frequency and Duration            Prognosis        Swallow Study   General HPI: 61 year old white male, known history DM TY 2, seizures who became dizzy outside his home on 6/26 tripped on a curb fell onto left hip, developed a contusion under right eye  Found to have left hip fracture in addition to nondisplaced left sixth rib fracture. He is s/p repair on 6/26. Type of Study: Bedside Swallow Evaluation Diet Prior to this Study: Thin liquids;Dysphagia 3 (soft) History of Recent Intubation: No Behavior/Cognition: Alert;Cooperative;Pleasant mood Oral Cavity Assessment: Within Functional Limits Oral Care Completed by SLP: No Oral Cavity - Dentition: Adequate natural dentition Vision: Functional for self-feeding Self-Feeding Abilities: Able to feed self Patient Positioning: Upright in chair Baseline Vocal Quality: Normal Volitional Cough: Strong    Oral/Motor/Sensory Function Overall Oral Motor/Sensory Function: Within functional limits   Ice Chips Ice  chips: Not tested   Thin Liquid Thin Liquid: Within functional limits Presentation: Cup;Straw    Nectar Thick Nectar Thick Liquid: Not tested   Honey Thick Honey Thick Liquid: Not tested   Puree Puree: Not tested   Solid     Solid: Not tested      Rainn Bullinger, Katherene Ponto 12/19/2021,2:19 PM

## 2021-12-19 NOTE — TOC Progression Note (Addendum)
Transition of Care [facility]) - Progression Note    Patient Details  Name: [name] [name] MRN: [id number] Date of Birth: 08-03-1960  Transition of Care [facility]) CM/SW [place], [place] Phone Number: 12/19/2021, 12:04 PM  Clinical Narrative:    12:45 PASSR approved, [id number] E, updated FL2 CSW spoke with pt and spouse at bedside this morning and provided bed offers. All questions were answered and family chose [facility]. Pt's PASSR is still pending at this time, insurance auth not needed.  Pt will have 3 midnight qualifying stay tomorrow. Facility notified and will have a bed available when ready. TOC will continue to follow for DC needs.  Expected Discharge Plan: [place] Barriers to Discharge: Continued Medical Work up, SNF Pending bed offer  Expected Discharge Plan and Services Expected Discharge Plan: [place] [employer] Choice: [place] arrangements for the past 2 months: Single Family Home                                       Social Determinants of Health (SDOH) Interventions    Readmission Risk Interventions     No data to display

## 2021-12-19 NOTE — Progress Notes (Signed)
Orthopaedic Trauma Service Progress Note  Patient ID: [name] [name] MRN: [id number] DOB/AGE: 1961/05/20 61 y.o.  Subjective:  Did much better with therapy this am but still c/o hip pain  Was able to ambulate in hallway  No other acute ortho changes   Doppler negative for DVT yesterday   ROS As above  Objective:   VITALS:   Vitals:   12/18/21 1715 12/18/21 2106 12/19/21 0352 12/19/21 0737  BP: (!) 114/59 95/80 (!) 93/53 93/68  Pulse: 84 87 92 91  Resp: 17 15 15    Temp: 98.1 F (36.7 C) 98.3 F (36.8 C)  98.6 F (37 C)  TempSrc: Oral   Oral  SpO2: 98% 93% 95% 94%  Weight:      Height:        Estimated body mass index is 22.14 kg/m as calculated from the following:   Height as of this encounter: 5' 11"  (1.803 m).   Weight as of this encounter: 72 kg.   Intake/Output      06/27 0701 06/28 0700 06/28 0701 06/29 0700   P.O. 480    I.V. (mL/kg) 44.2 (0.6)    Other     IV Piggyback     Total Intake(mL/kg) 524.2 (7.3)    Urine (mL/kg/hr) 300 (0.2)    Blood     Total Output 300    Net +224.2         Urine Occurrence 3 x      LABS  Results for orders placed or performed during the hospital encounter of 12/17/21 (from the past 24 hour(s))  Glucose, capillary     Status: Abnormal   Collection Time: 12/18/21 11:04 AM  Result Value Ref Range   Glucose-Capillary 116 (H) 70 - 99 mg/dL  Glucose, capillary     Status: None   Collection Time: 12/18/21  4:02 PM  Result Value Ref Range   Glucose-Capillary 99 70 - 99 mg/dL  Glucose, capillary     Status: Abnormal   Collection Time: 12/18/21  9:39 PM  Result Value Ref Range   Glucose-Capillary 129 (H) 70 - 99 mg/dL  CBC     Status: Abnormal   Collection Time: 12/19/21  3:05 AM  Result Value Ref Range   WBC 5.8 4.0 - 10.5 K/uL   RBC 2.41 (L) 4.22 - 5.81 MIL/uL   Hemoglobin 7.8 (L) 13.0 - 17.0 g/dL   HCT 23.1 (L) 39.0 - 52.0 %   MCV 95.9  80.0 - 100.0 fL   MCH 32.4 26.0 - 34.0 pg   MCHC 33.8 30.0 - 36.0 g/dL   RDW 14.0 11.5 - 15.5 %   Platelets 71 (L) 150 - 400 K/uL   nRBC 0.0 0.0 - 0.2 %  Basic metabolic panel     Status: Abnormal   Collection Time: 12/19/21  3:05 AM  Result Value Ref Range   Sodium 139 135 - 145 mmol/L   Potassium 3.7 3.5 - 5.1 mmol/L   Chloride 107 98 - 111 mmol/L   CO2 24 22 - 32 mmol/L   Glucose, Bld 96 70 - 99 mg/dL   BUN 19 6 - 20 mg/dL   Creatinine, Ser 0.95 0.61 - 1.24 mg/dL   Calcium 8.7 (L) 8.9 - 10.3 mg/dL   GFR, Estimated >60 >60 mL/min  Anion gap 8 5 - 15  Glucose, capillary     Status: None   Collection Time: 12/19/21  7:18 AM  Result Value Ref Range   Glucose-Capillary 97 70 - 99 mg/dL   *Note: Due to a large number of results and/or encounters for the requested time period, some results have not been displayed. A complete set of results can be found in Results Review.     PHYSICAL EXAM:   Gen: sitting up in bed, NAD  Ext:       Left Lower Extremity              Dressing stable    Moderate ecchymosis around L hip             Ext warm              Motor and sensory functions intact             No DCT             Compartments are soft   No pitting edema     Assessment/Plan: 2 Days Post-Op      Anti-infectives (From admission, onward)    Start     Dose/Rate Route Frequency Ordered Stop   12/18/21 0600  ceFAZolin (ANCEF) IVPB 2g/100 mL premix        2 g 200 mL/hr over 30 Minutes Intravenous On call to O.R. 12/17/21 1335 12/17/21 1710   12/17/21 2300  ceFAZolin (ANCEF) IVPB 2g/100 mL premix        2 g 200 mL/hr over 30 Minutes Intravenous Every 6 hours 12/17/21 2002 12/19/21 0751   12/17/21 2200  rifaximin (XIFAXAN) tablet 550 mg        550 mg Oral 2 times daily 12/17/21 2000       .  POD/HD#: 61  61 year old male with fragility fracture left hip   -Ground-level fall   -Fragility fracture left hip s/p IMN              Weight-bear as tolerated left leg  with assistance             Range of motion as tolerated             PT and OT             Ice as needed for swelling and pain control             Dressing changes as needed               Given history surgical specimens from L hip sent to pathology, results pending      - Pain management:             Multimodal                    scheduled robaxin and tylenol          Oxy IR PRN    - ABL anemia/Hemodynamics             Montior              Cbc in am              TXA given post op   - Medical issues              Per Primary                AKI  Likely hypovolemia                          Monitor    - DVT/PE prophylaxis:             hold lovenox due to thrombocytopenia and ABLA   - ID:              Periop abx   - Metabolic Bone Disease:             Fracture indicative of osteoporosis             Outpatient referral to osteoporosis clinic             Vitamin D levels look good              I do not see a DEXA in the system.  He will require 1 in the next month or 2   - Activity:             As above   - Impediments to fracture healing:             Osteoporosis/fragility fracture             Autoimmune disease and associated meds   - Dispo:             Therapies  SNF tomorrow     Jari Pigg, PA-C [phone] (C) 12/19/2021, 9:34 AM  [facility] [place] [phone] Jenetta [phone] (F)    After 5pm and on the weekends please log on to Amion, go to orthopaedics and the look under the [facility] Call for the provider(s) on call. You can also call our office at [phone] and then follow the prompts to be connected to the call team.   Patient ID: [name] [name], male   DOB: 12-23-1960, 61 y.o.   MRN: [id number]

## 2021-12-20 ENCOUNTER — Inpatient Hospital Stay (HOSPITAL_COMMUNITY): Payer: Medicare Other

## 2021-12-20 DIAGNOSIS — E119 Type 2 diabetes mellitus without complications: Secondary | ICD-10-CM

## 2021-12-20 DIAGNOSIS — D500 Iron deficiency anemia secondary to blood loss (chronic): Secondary | ICD-10-CM | POA: Diagnosis not present

## 2021-12-20 DIAGNOSIS — S72145A Nondisplaced intertrochanteric fracture of left femur, initial encounter for closed fracture: Secondary | ICD-10-CM | POA: Diagnosis not present

## 2021-12-20 LAB — PREPARE RBC (CROSSMATCH)

## 2021-12-20 LAB — CBC
HCT: 22.2 % — ABNORMAL LOW (ref 39.0–52.0)
Hemoglobin: 7.6 g/dL — ABNORMAL LOW (ref 13.0–17.0)
MCH: 32.1 pg (ref 26.0–34.0)
MCHC: 34.2 g/dL (ref 30.0–36.0)
MCV: 93.7 fL (ref 80.0–100.0)
Platelets: 69 K/uL — ABNORMAL LOW (ref 150–400)
RBC: 2.37 MIL/uL — ABNORMAL LOW (ref 4.22–5.81)
RDW: 14.2 % (ref 11.5–15.5)
WBC: 4.7 K/uL (ref 4.0–10.5)
nRBC: 0 % (ref 0.0–0.2)

## 2021-12-20 LAB — GLUCOSE, CAPILLARY
Glucose-Capillary: 91 mg/dL (ref 70–99)
Glucose-Capillary: 113 mg/dL — ABNORMAL HIGH (ref 70–99)
Glucose-Capillary: 77 mg/dL (ref 70–99)
Glucose-Capillary: 147 mg/dL — ABNORMAL HIGH (ref 70–99)
Glucose-Capillary: 105 mg/dL — ABNORMAL HIGH (ref 70–99)

## 2021-12-20 LAB — HEMOGLOBIN AND HEMATOCRIT, BLOOD
HCT: 24.2 % — ABNORMAL LOW (ref 39.0–52.0)
Hemoglobin: 8.6 g/dL — ABNORMAL LOW (ref 13.0–17.0)

## 2021-12-20 MED ORDER — OXYCODONE HCL 5 MG PO TABS
10.0000 mg | ORAL_TABLET | ORAL | Status: DC | PRN
  Administered 2021-12-21: 15 mg via ORAL
  Administered 2021-12-21 – 2021-12-23 (×6): 10 mg via ORAL
  Administered 2021-12-24: 15 mg via ORAL
  Administered 2021-12-24: 10 mg via ORAL
  Filled 2021-12-20 (×3): qty 2
  Filled 2021-12-20: qty 3
  Filled 2021-12-20 (×2): qty 2
  Filled 2021-12-20: qty 3
  Filled 2021-12-20 (×3): qty 2

## 2021-12-20 MED ORDER — SODIUM CHLORIDE 0.9% IV SOLUTION: Freq: Once | INTRAVENOUS | Status: DC

## 2021-12-20 MED ORDER — DICLOFENAC SODIUM 1 % EX GEL
2.0000 g | Freq: Four times a day (QID) | CUTANEOUS | Status: DC
  Administered 2021-12-20 – 2021-12-24 (×13): 2 g via TOPICAL
  Filled 2021-12-20: qty 100

## 2021-12-20 NOTE — Progress Notes (Signed)
PT Cancellation Note  Patient Details Name: JAEDIN REGINA MRN: [id number] DOB: [birth year]   Cancelled Treatment:    Reason Eval/Treat Not Completed: Patient at procedure or test/unavailable. Pt having ultrasound guided IV placed.   Donna Bernard 12/20/2021, 12:58 PM

## 2021-12-20 NOTE — Progress Notes (Signed)
Time 0005 Patient has been extremely fidgety, taking off his gown, pulling off condom catheter, twisting in bed, pulled out his midline.    Iv team consulted and will not put a line in at this time since he is not getting anything through the line.  Place consult if iv medications and/or fluids needed.     Time 0533 As morning approaches, patient seems less confused and less restless.

## 2021-12-20 NOTE — Care Management Important Message (Signed)
Important Message  Patient Details  Name: [name] [name] MRN: [id number] Date of Birth: [birth year]   Medicare Important Message Given:  Yes     Akiya Morr 12/20/2021, 2:42 PM

## 2021-12-20 NOTE — Progress Notes (Deleted)
Pt refused CPAP, says he cannot tolerate the CPAP and does not wear it at home. He says he will call during the night if he feels like he needs it.

## 2021-12-20 NOTE — Progress Notes (Signed)
PROGRESS NOTE   PSALM SCHAPPELL  ZHY:[id number] DOB: 1960-11-08 DOA: 12/17/2021 PCP: [name], MD    Brief Narrative:  61 year old white male, known history DM TY 2, seizures who became dizzy outside his home on 6/26 tripped on a curb fell onto left hip, developed a contusion under right eye Found to have left hip fracture in addition to nondisplaced left sixth rib fracture. He is s/p repair on 6/26. Stay complicated by low hgb and continued pain   Hospital-Problem based course  Left hip fracture -Status post IM nail left hip 6/26 Dr. Marcelino Scot -drop in h/h so lovenox held -pain control -incentive spirometry  -no DVT  Prior diagnosis of squamous cell CA right cheek + XRT status post Libtayo  Interval surveillance at this time-consideration as an outpatient to continue Libtayo versus not given underlying Crohn's disease which can worsen from this medication  follow-up with Dr. Salley Scarlet of dermatology and ENT Dr. Barton Dubois--- continue [name] 25 daily Seems to be maintained on dysphagia 3 diet  Anemia of expected blood loss from surgery -transfuse this AM  AKI secondary to perioperative state  -hold IVF  -Cr improved  History of complex partial seizures Continue Lamictal 150 twice daily  DM TY 2 -SSI  Crohn's disease followed by Dr. Fuller Plan Continue Lialda 2.4 daily, methscopolamine 5 mg twice daily Continue lactase 3000 twice daily Continue Protonix 40 twice daily  BPH Continue Detrol LA 8 daily, Flomax 0.4  Liver cirrhosis with mass No ascites AFP 38 Undergoing work-up and was to be seen 12/12/2021 for liver directed therapies Outpatient follow-up with oncology Continue rifampin 550 twice daily for presumed prevention of hepatic encephalopathy  Depression Follow-up as an outpatient with Dr. Darleene Cleaver continue trazodone 150 at bedtime sertraline 1 200 every morning   DVT prophylaxis: scd Code Status: Full code Family Communication: at bedside Disposition:   Status is: Inpatient Remains inpatient appropriate because:   Trend h/h, await SNF   Consultants:  orthopedics   Procedures: IM nail left hip 6/26   Subjective:  Pain continues to be an issue  Objective: Vitals:   12/19/21 0737 12/19/21 1525 12/19/21 2015 12/20/21 0512  BP: 93/68 (!) 160/73 (!) 99/53 115/64  Pulse: 91 (!) 103 100 90  Resp:   16 15  Temp: 98.6 F (37 C) 98.9 F (37.2 C) 99.9 F (37.7 C) 98.8 F (37.1 C)  TempSrc: Oral Oral  Oral  SpO2: 94% 96% 93% 95%  Weight:      Height:        Intake/Output Summary (Last 24 hours) at 12/20/2021 1225 Last data filed at 12/20/2021 0900 Gross per 24 hour  Intake 360 ml  Output 750 ml  Net -390 ml   Filed Weights   12/17/21 1053 12/17/21 1419  Weight: 72 kg 72 kg    Examination:   General: Appearance:    Ill appearing male in no acute distress     Lungs:      respirations unlabored  Heart:    Normal heart rate.   MS:   All extremities are intact.   Neurologic:   Awake, alert       Data Reviewed: personally reviewed   CBC    Component Value Date/Time   WBC 4.7 12/20/2021 0548   RBC 2.37 (L) 12/20/2021 0548   HGB 7.6 (L) 12/20/2021 0548   HGB 9.7 (L) 07/12/2021 1118   HGB 12.3 (L) 07/12/2019 0900   HCT 22.2 (L) 12/20/2021 0548   HCT 34.6 (L) 07/12/2019  0900   PLT 69 (L) 12/20/2021 0548   PLT 118 (L) 07/12/2021 1118   PLT 90 (LL) 07/12/2019 0900   MCV 93.7 12/20/2021 0548   MCV 108 (H) 07/12/2019 0900   MCH 32.1 12/20/2021 0548   MCHC 34.2 12/20/2021 0548   RDW 14.2 12/20/2021 0548   RDW 14.8 07/12/2019 0900   LYMPHSABS 0.5 (L) 12/17/2021 1102   LYMPHSABS 0.6 (L) 12/28/2018 1350   MONOABS 0.5 12/17/2021 1102   EOSABS 0.2 12/17/2021 1102   EOSABS 0.1 12/28/2018 1350   BASOSABS 0.0 12/17/2021 1102   BASOSABS 0.0 12/28/2018 1350      Latest Ref Rng & Units 12/19/2021    3:05 AM 12/18/2021    4:04 AM 12/17/2021   11:02 AM  CMP  Glucose 70 - 99 mg/dL 96  180  83   BUN 6 - 20 mg/dL 19  16   14    Creatinine 0.61 - 1.24 mg/dL 0.95  1.25  1.19   Sodium 135 - 145 mmol/L 139  138  143   Potassium 3.5 - 5.1 mmol/L 3.7  3.9  3.7   Chloride 98 - 111 mmol/L 107  107  113   CO2 22 - 32 mmol/L 24  22  22    Calcium 8.9 - 10.3 mg/dL 8.7  8.7  9.3   Total Protein 6.5 - 8.1 g/dL   6.0   Total Bilirubin 0.3 - 1.2 mg/dL   1.8   Alkaline Phos 38 - 126 U/L   126   AST 15 - 41 U/L   30   ALT 0 - 44 U/L   19      Radiology Studies: VAS [place] LOWER EXTREMITY VENOUS (DVT)  Result Date: 12/18/2021  Lower Venous DVT Study Patient Name:  [name] [name]  Date of Exam:   12/18/2021 Medical Rec #: [id number]      Accession #:    [id number] Date of Birth: 26-Feb-1961      Patient Gender: M Patient Age:   28 years Exam Location:  [facility] Procedure:      VAS [place] LOWER EXTREMITY VENOUS (DVT) Referring Phys: Ainsley Spinner --------------------------------------------------------------------------------  Indications: Pain, and Edema.  Risk Factors: Left hip fracture. Comparison Study: No prior study Performing Technologist: Maudry Mayhew MHA, RDMS, RVT, RDCS  Examination Guidelines: A complete evaluation includes B-mode imaging, spectral Doppler, color Doppler, and power Doppler as needed of all accessible portions of each vessel. Bilateral testing is considered an integral part of a complete examination. Limited examinations for reoccurring indications may be performed as noted. The reflux portion of the exam is performed with the patient in reverse Trendelenburg.  +-----+---------------+---------+-----------+----------+--------------+ RIGHTCompressibilityPhasicitySpontaneityPropertiesThrombus Aging +-----+---------------+---------+-----------+----------+--------------+ CFV  Full           Yes      Yes                                 +-----+---------------+---------+-----------+----------+--------------+   +---------+---------------+---------+-----------+----------+--------------+ LEFT      CompressibilityPhasicitySpontaneityPropertiesThrombus Aging +---------+---------------+---------+-----------+----------+--------------+ CFV      Full           Yes      Yes                                 +---------+---------------+---------+-----------+----------+--------------+ SFJ      Full                                                        +---------+---------------+---------+-----------+----------+--------------+  FV Prox  Full                                                        +---------+---------------+---------+-----------+----------+--------------+ FV Mid   Full                                                        +---------+---------------+---------+-----------+----------+--------------+ FV DistalFull                                                        +---------+---------------+---------+-----------+----------+--------------+ PFV      Full                                                        +---------+---------------+---------+-----------+----------+--------------+ POP      Full           Yes      Yes                                 +---------+---------------+---------+-----------+----------+--------------+ PTV      Full                                                        +---------+---------------+---------+-----------+----------+--------------+   Left Technical Findings: Not visualized segments include peroneal veins.   Summary: RIGHT: - No evidence of common femoral vein obstruction.  LEFT: - There is no evidence of deep vein thrombosis in the lower extremity. However, portions of this examination were limited- see technologist comments above.  - No cystic structure found in the popliteal fossa.  *See table(s) above for measurements and observations. Electronically signed by Harold Barban MD on 12/18/2021 at 10:35:32 PM.    Final      Scheduled Meds:  sodium chloride   Intravenous Once   acetaminophen  1,000 mg Oral TID    acitretin  25 mg Oral Daily   Chlorhexidine Gluconate Cloth  6 each Topical Daily   diclofenac Sodium  2 g Topical QID   docusate sodium  100 mg Oral BID   feeding supplement  237 mL Oral TID BM   fesoterodine  8 mg Oral Daily   folic acid  2 mg Oral Daily   insulin aspart  0-9 Units Subcutaneous TID WC   lactase  3,000 Units Oral BID   lamoTRIgine  150 mg Oral BID   latanoprost  1 drop Both Eyes QHS   lidocaine  1 patch Transdermal Q24H   mesalamine  2.4 g Oral Daily   methocarbamol  750 mg Oral TID   methscopolamine  5 mg Oral BID   multivitamin  with minerals  1 tablet Oral Daily   mupirocin ointment  1 Application Topical TID   pantoprazole  40 mg Oral BID AC   potassium chloride SA  20 mEq Oral BID   rifaximin  550 mg Oral BID   sertraline  200 mg Oral Q breakfast   sodium chloride flush  10-40 mL Intracatheter Q12H   tamsulosin  0.4 mg Oral QHS   traZODone  150 mg Oral QHS   Continuous Infusions:     LOS: 3 days   Time spent: [place], DO [facility]   12/20/2021, 12:25 PM

## 2021-12-20 NOTE — TOC Progression Note (Signed)
Transition of Care [facility]) - Progression Note    Patient Details  Name: [name] [name] MRN: [id number] Date of Birth: 1960-07-25  Transition of Care [facility]) CM/SW Contact  Coralee P[place], [place] Phone Number: 12/20/2021, 1:16 PM  Clinical Narrative:    CSW notified pt is not medically ready to DC today. Facility and spouse updated, will plan for tomorrow. TOC will continue to follow for DC needs.   Expected Discharge Plan: [place] Barriers to Discharge: Continued Medical Work up, SNF Pending bed offer  Expected Discharge Plan and Services Expected Discharge Plan: [place] Choice: [place] arrangements for the past 2 months: Single Family Home                                       Social Determinants of Health (SDOH) Interventions    Readmission Risk Interventions     No data to display

## 2021-12-20 NOTE — Progress Notes (Signed)
Pt refused CPAP. He says he will call during the night if he feels like he needs it

## 2021-12-20 NOTE — Progress Notes (Signed)
Physical Therapy Treatment Patient Details Name: [name] [name] MRN: [id number] DOB: Aug 08, 1960 Today's Date: 12/20/2021   History of Present Illness Pt is a 61 y/o male who presents to acute care PT s/p IM nailing to L hip after fall. Pt also with likely left 6th anterior rib fracture. Pt has PMH of seizure disorder, Crohn's, anxiety and depression, DM, liver cirrhosis, GERD, and HTN.    PT Comments    Pt's session cut short due to going for testing out of room in middle of treatment. Pt with poor ability to perform heel slides and quad sets 2/2 weakness and pain; maximal cues required (tactile, verbal, and demonstration).    Recommendations for follow up therapy are one component of a multi-disciplinary discharge planning process, led by the attending physician.  Recommendations may be updated based on patient status, additional functional criteria and insurance authorization.  Follow Up Recommendations  Skilled nursing-short term rehab (<3 hours/day) Can patient physically be transported by private vehicle: Yes   Assistance Recommended at Discharge Frequent or constant Supervision/Assistance  Patient can return home with the following A lot of help with walking and/or transfers;A lot of help with bathing/dressing/bathroom;Assist for transportation;Help with stairs or ramp for entrance   Equipment Recommendations  None recommended by PT (recommending SNF)    Recommendations for Other Services       Precautions / Restrictions Precautions Precautions: Fall Restrictions Weight Bearing Restrictions: Yes LLE Weight Bearing: Weight bearing as tolerated     Mobility  Bed Mobility                    Transfers                        Ambulation/Gait                   Stairs             Wheelchair Mobility    Modified Rankin (Stroke Patients Only)       Balance Overall balance assessment: Needs assistance Sitting-balance support: Bilateral  upper extremity supported Sitting balance-Leahy Scale: Poor     Standing balance support: Bilateral upper extremity supported Standing balance-Leahy Scale: Poor                              Cognition Arousal/Alertness: Awake/alert Behavior During Therapy: WFL for tasks assessed/performed Overall Cognitive Status: Impaired/Different from baseline Area of Impairment: Memory, Awareness                     Memory: Decreased recall of precautions, Decreased short-term memory     Awareness: Emergent   General Comments: h/o memory issues but worsening cognition after pain meds yesterday per pt's wife        Exercises General Exercises - Lower Extremity Quad Sets: Left, Supine (8 reps) Heel Slides: Supine, Left (with UE assist x 8 reps)    General Comments        Pertinent Vitals/Pain Pain Assessment Pain Assessment: 0-10 Pain Score: 9  (8/10 left ribs and 9/10 left hip) Pain Location: left hip and left ribs Pain Descriptors / Indicators: Guarding, Moaning Pain Intervention(s): Limited activity within patient's tolerance, Monitored during session, Patient requesting pain meds-RN notified    Home Living  Prior Function            PT Goals (current goals can now be found in the care plan section) Acute Rehab PT Goals PT Goal Formulation: With patient Time For Goal Achievement: 12/25/21 Potential to Achieve Goals: Good Progress towards PT goals: Progressing toward goals    Frequency    Min 3X/week      PT Plan Current plan remains appropriate    Co-evaluation              AM-PAC PT "6 Clicks" Mobility   Outcome Measure  Help needed turning from your back to your side while in a flat bed without using bedrails?: A Lot Help needed moving from lying on your back to sitting on the side of a flat bed without using bedrails?: A Lot Help needed moving to and from a bed to a chair (including a  wheelchair)?: A Lot Help needed standing up from a chair using your arms (e.g., wheelchair or bedside chair)?: A Little Help needed to walk in hospital room?: A Little Help needed climbing 3-5 steps with a railing? : A Lot 6 Click Score: 14    End of Session   Activity Tolerance: Patient limited by pain Patient left: in bed;with bed alarm set;with call bell/phone within reach Nurse Communication: Patient requests pain meds PT Visit Diagnosis: Unsteadiness on feet (R26.81);History of falling (Z91.81);Pain;Muscle weakness (generalized) (M62.81) Pain - Right/Left: Left Pain - part of body: Hip     Time: 2841-3244 PT Time Calculation (min) (ACUTE ONLY): 16 min  Charges:  $Therapeutic Exercise: 8-22 mins                     Donna Bernard, PT    [facility] 12/20/2021, 10:54 AM

## 2021-12-20 NOTE — Progress Notes (Signed)
Modified Barium Swallow Progress Note  Patient Details  Name: [name] [name] MRN: [id number] Date of Birth: 06-Apr-1961  Today's Date: 12/20/2021  Modified Barium Swallow completed.  Full report located under Chart Review in the Imaging Section.  Brief recommendations include the following:  Clinical Impression  Pt demonstrates a moderate oropharyngeal dysphagia related to anatomical difference in cervical spine structure impacting pharyngeal movement, all secondary to pts previously diagnosed [name]. Pt with cervical spine changes including bony protrusions stretching from C1-C6 as well as cervical lordosis inpacting head and pharyngeal position. Pt cannot tuck his chin.  Pt has partial dentition, no botton teeth and always eats soft foods. Oral phase otherwise marked by peicemeal oral transit with premature spillage to pharynx with liquids. Pt does achieve and sustains good laryngeal closure and elevation in a relatively timely manner throughout single and consecutive swallowing efforts, with brief and successive hyoid bursts to propel piecemealed boluses. During bursts, there is penetration of liquids during the swallow, that are mostly ejected by the last (usually three) bursts.  There were some instances of trace sensed aspiration with thin liquids with long cesecutive swallows if pt briefly releases glottic closure during efforts.  Pt does have some mild vallecular and pyriform pooling after the swallow that penetrates the vestibule, sometimes to the vocal folds that pt ejects with a throat clear. Though dysphagia is mild and likely at baseline, pt has a high risk of changes in mentation, strength and positioning. There is increased risk of aspiration at this time until mentation and mobility improve. WIll change diet to soft solids with nectar thick liquids to reduce risk during acute recovery phase. Discussed with wife.   Swallow Evaluation Recommendations       SLP Diet Recommendations:  Dysphagia 3 (Mech soft) solids;Nectar thick liquid   Liquid Administration via: Cup;Straw   Medication Administration: Whole meds with liquid   Supervision: Staff to assist with self feeding   Compensations: Slow rate;Small sips/bites   Postural Changes: Remain semi-upright after after feeds/meals (Comment);Seated upright at 90 degrees            Toria Monte, Katherene Ponto 12/20/2021,2:02 PM

## 2021-12-21 DIAGNOSIS — S72145A Nondisplaced intertrochanteric fracture of left femur, initial encounter for closed fracture: Secondary | ICD-10-CM | POA: Diagnosis not present

## 2021-12-21 LAB — BPAM RBC
Blood Product Expiration Date: 202307212359
Blood Product Expiration Date: 202307222359
ISSUE DATE / TIME: 202306291116
ISSUE DATE / TIME: 202306291357
Unit Type and Rh: 5100
Unit Type and Rh: 5100

## 2021-12-21 LAB — TYPE AND SCREEN
ABO/RH(D): O POS
Antibody Screen: NEGATIVE
Unit division: 0
Unit division: 0

## 2021-12-21 LAB — CBC
HCT: 25.3 % — ABNORMAL LOW (ref 39.0–52.0)
Hemoglobin: 8.4 g/dL — ABNORMAL LOW (ref 13.0–17.0)
MCH: 31 pg (ref 26.0–34.0)
MCHC: 33.2 g/dL (ref 30.0–36.0)
MCV: 93.4 fL (ref 80.0–100.0)
Platelets: 77 10*3/uL — ABNORMAL LOW (ref 150–400)
RBC: 2.71 MIL/uL — ABNORMAL LOW (ref 4.22–5.81)
RDW: 14.3 % (ref 11.5–15.5)
WBC: 4.7 10*3/uL (ref 4.0–10.5)
nRBC: 0 % (ref 0.0–0.2)

## 2021-12-21 LAB — GLUCOSE, CAPILLARY
Glucose-Capillary: 97 mg/dL (ref 70–99)
Glucose-Capillary: 126 mg/dL — ABNORMAL HIGH (ref 70–99)
Glucose-Capillary: 121 mg/dL — ABNORMAL HIGH (ref 70–99)
Glucose-Capillary: 113 mg/dL — ABNORMAL HIGH (ref 70–99)

## 2021-12-21 MED ORDER — ENOXAPARIN SODIUM 40 MG/0.4ML IJ SOSY: 40.0000 mg | PREFILLED_SYRINGE | INTRAMUSCULAR | Status: DC

## 2021-12-21 MED ORDER — ACETAMINOPHEN 500 MG PO TABS: 1000.0000 mg | ORAL_TABLET | Freq: Three times a day (TID) | ORAL | 0 refills | Status: AC

## 2021-12-21 MED ORDER — OXYCODONE HCL 10 MG PO TABS: 10.0000 mg | ORAL_TABLET | ORAL | 0 refills | Status: DC | PRN

## 2021-12-21 MED ORDER — DOCUSATE SODIUM 100 MG PO CAPS: 100.0000 mg | ORAL_CAPSULE | Freq: Two times a day (BID) | ORAL | 0 refills | Status: DC

## 2021-12-21 MED ORDER — LIDOCAINE 5 % EX PTCH: 1.0000 | MEDICATED_PATCH | CUTANEOUS | 0 refills | Status: DC

## 2021-12-21 MED ORDER — METHOCARBAMOL 750 MG PO TABS: 750.0000 mg | ORAL_TABLET | Freq: Three times a day (TID) | ORAL | Status: DC

## 2021-12-21 MED ORDER — DICLOFENAC SODIUM 1 % EX GEL: 2.0000 g | Freq: Four times a day (QID) | CUTANEOUS | Status: DC

## 2021-12-21 NOTE — Progress Notes (Signed)
Orthopaedic Trauma Service Progress Note  Patient ID: [name] [name] MRN: [id number] DOB/AGE: 1961/02/10 61 y.o.  Subjective:  Ortho issues stable    ROS As above  Objective:   VITALS:   Vitals:   12/20/21 2004 12/20/21 2314 12/21/21 0454 12/21/21 0742  BP: 115/90  118/72 118/71  Pulse: 82 85 70 77  Resp: 16 16 18 17   Temp: 98.9 F (37.2 C)   98.2 F (36.8 C)  TempSrc: Oral   Oral  SpO2: 92% 96% 98% 96%  Weight:      Height:        Estimated body mass index is 22.14 kg/m as calculated from the following:   Height as of this encounter: 5' 11"  (1.803 m).   Weight as of this encounter: 72 kg.   Intake/Output      06/29 0701 06/30 0700 06/30 0701 07/01 0700   P.O. 360 240   Blood 432    Total Intake(mL/kg) 792 (11) 240 (3.3)   Urine (mL/kg/hr) 750 (0.4)    Total Output 750    Net +42 +240        Urine Occurrence 1 x    Stool Occurrence  1 x     LABS  Results for orders placed or performed during the hospital encounter of 12/17/21 (from the past 24 hour(s))  Glucose, capillary     Status: Abnormal   Collection Time: 12/20/21 11:08 AM  Result Value Ref Range   Glucose-Capillary 113 (H) 70 - 99 mg/dL  Glucose, capillary     Status: None   Collection Time: 12/20/21  3:40 PM  Result Value Ref Range   Glucose-Capillary 77 70 - 99 mg/dL  Glucose, capillary     Status: Abnormal   Collection Time: 12/20/21  6:54 PM  Result Value Ref Range   Glucose-Capillary 147 (H) 70 - 99 mg/dL  Hemoglobin and hematocrit, blood     Status: Abnormal   Collection Time: 12/20/21  7:51 PM  Result Value Ref Range   Hemoglobin 8.6 (L) 13.0 - 17.0 g/dL   HCT 24.2 (L) 39.0 - 52.0 %  Glucose, capillary     Status: Abnormal   Collection Time: 12/20/21  9:20 PM  Result Value Ref Range   Glucose-Capillary 105 (H) 70 - 99 mg/dL  CBC     Status: Abnormal   Collection Time: 12/21/21  1:42 AM  Result Value Ref  Range   WBC 4.7 4.0 - 10.5 K/uL   RBC 2.71 (L) 4.22 - 5.81 MIL/uL   Hemoglobin 8.4 (L) 13.0 - 17.0 g/dL   HCT 25.3 (L) 39.0 - 52.0 %   MCV 93.4 80.0 - 100.0 fL   MCH 31.0 26.0 - 34.0 pg   MCHC 33.2 30.0 - 36.0 g/dL   RDW 14.3 11.5 - 15.5 %   Platelets 77 (L) 150 - 400 K/uL   nRBC 0.0 0.0 - 0.2 %  Glucose, capillary     Status: None   Collection Time: 12/21/21  7:39 AM  Result Value Ref Range   Glucose-Capillary 97 70 - 99 mg/dL   *Note: Due to a large number of results and/or encounters for the requested time period, some results have not been displayed. A complete set of results can be found in Results Review.     PHYSICAL  EXAM:   Gen: NAD  Ext:       Left Lower Extremity              Dressing stable, incision stable             Moderate ecchymosis around L hip             Ext warm              Motor and sensory functions intact             No DCT             Compartments are soft              No pitting edema     Assessment/Plan: 4 Days Post-Op    Anti-infectives (From admission, onward)    Start     Dose/Rate Route Frequency Ordered Stop   12/18/21 0600  ceFAZolin (ANCEF) IVPB 2g/100 mL premix        2 g 200 mL/hr over 30 Minutes Intravenous On call to O.R. 12/17/21 1335 12/17/21 1710   12/17/21 2300  ceFAZolin (ANCEF) IVPB 2g/100 mL premix        2 g 200 mL/hr over 30 Minutes Intravenous Every 6 hours 12/17/21 2002 12/19/21 0751   12/17/21 2200  rifaximin (XIFAXAN) tablet 550 mg        550 mg Oral 2 times daily 12/17/21 2000       .  POD/HD#:   61 year old male with fragility fracture left hip   -Ground-level fall   -Fragility fracture left hip s/p IMN              Weight-bear as tolerated left leg with assistance             Range of motion as tolerated             PT and OT             Ice as needed for swelling and pain control             Dressing changes as needed               Given history surgical specimens from L hip sent to pathology-->  no evidence of malignancy noted on analysis      - Pain management:             Multimodal                    scheduled robaxin and tylenol                     Oxy IR PRN    - ABL anemia/Hemodynamics             post transfusion  Stable this am    - Medical issues              Per Primary      - DVT/PE prophylaxis:             resume lovenox with improved h/h and plts trending up    Lovenox x 21 days    - ID:              Periop abx completed   - Metabolic Bone Disease:             Fracture indicative of osteoporosis  Outpatient referral to osteoporosis clinic             Vitamin D levels look good              I do not see a DEXA in the system.  He will require 1 in the next month or 2   - Activity:             As above   - Impediments to fracture healing:             Osteoporosis/fragility fracture             Autoimmune disease and associated meds   - Dispo:             SNF today   Jari Pigg, PA-C [phone] (C) 12/21/2021, 10:54 AM  [facility] [place] [address] [phone] Jenetta [phone] (F)    After 5pm and on the weekends please log on to Amion, go to orthopaedics and the look under the [facility] Call for the provider(s) on call. You can also call our office at [phone] and then follow the prompts to be connected to the call team.   Patient ID: [name] [name], male   DOB: [birth year], 61 y.o.   MRN: [id number]

## 2021-12-21 NOTE — Discharge Summary (Signed)
Physician Discharge Summary  [name] [name] YCX:[id number] DOB: 12-Sep-1960 DOA: 12/17/2021  PCP: [name], MD  Admit date: 12/17/2021 Discharge date: 12/21/2021  Admitted From: home Discharge disposition: snf   Recommendations for Outpatient Follow-Up:   Cbc, bmp 1 week Pain control Lovenox x 21 days  DEXA scan when able   Discharge Diagnosis:   Principal Problem:   Closed nondisplaced intertrochanteric fracture of left femur, initial encounter (Uncertain) Active Problems:   Iron deficiency anemia due to chronic blood loss and thrombocytopenia    Rib fracture   DM2 (diabetes mellitus, type 2) (HCC)   Liver cirrhosis secondary to NASH with liver mass    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  Diet recommendation: DYS 3  Wound care: None.  Code status: Full.   History of Present Illness:   [name] [name] is a 61 y.o. male with medical history significant of seizure disorder, chron's, T2DM, depression and anxiety, GERD with hx of esophageal stricture, HTN, HLD, liver cirrhosis secondary to NASH, OSA on cpap, keratinizing SCC of the left cheek currently on neoadjuvant cemiplimab. Surgical resection on 09/18/21. History of SCC secondary to treatment for his chron's disease who presented to ED s/p fall this morning while out for a walk.    He was out walking with his walking sticks and started to get unstable/dizzy which is common for him and he tried to get to the grass and must have tripped over curb. Fell onto his left side with immediate pain over lateral left side and left hip.  Had immediate pain, unable to bear weight. Was able to call wife for help. Has sensation and  movement in foot, but states sensation is decreasing. History of frequent falls. On no blood thinners.    Denies any fever/chills, vision changes/headaches, chest pain or palpitations, shortness of breath or cough, +abdominal pain, N/V/D, +dysuria, no leg swelling.    He does not smoke or drink.    Hospital Course by Problem:   Left hip fracture -Status post IM nail left hip 6/26 Dr. Marcelino Scot -pain control -incentive spirometry  -no DVT   Prior diagnosis of squamous cell CA right cheek + XRT status post Libtayo  Interval surveillance at this time-consideration as an outpatient to continue Libtayo versus not given underlying Crohn's disease which can worsen from this medication  follow-up with Dr. Salley Scarlet of dermatology and ENT Dr. Barton Dubois--- continue [name] 25 daily Seems to be maintained on dysphagia 3 diet   Anemia of expected blood loss from surgery -transfused x 1 and hgb stable -outpatient follow up  Rib fracture -incentive spirometry -lidocaine patch   AKI secondary to perioperative state             -hold IVF             -Cr improved   History of complex partial seizures Continue Lamictal 150 twice daily   DM TY 2 -SSI   Crohn's disease followed by Dr. Fuller Plan Continue Lialda 2.4 daily, methscopolamine 5 mg twice daily Continue lactase 3000 twice daily Continue Protonix 40 twice daily   BPH Continue Detrol LA 8 daily, Flomax 0.4   Liver cirrhosis  with mass No ascites AFP 38 Undergoing work-up and was to be seen 12/12/2021 for liver directed therapies Outpatient follow-up with oncology Continue rifampin 550 twice daily for presumed prevention of hepatic encephalopathy   Depression Follow-up as an outpatient with Dr. Darleene Cleaver continue trazodone 150 at bedtime sertraline 1 200 every morning    Medical Consultants:   ortho   Discharge Exam:   Vitals:   12/21/21 0454 12/21/21 0742  BP: 118/72 118/71  Pulse: 70 77  Resp: 18 17  Temp:  98.2 F (36.8  C)  SpO2: 98% 96%   Vitals:   12/20/21 2004 12/20/21 2314 12/21/21 0454 12/21/21 0742  BP: 115/90  118/72 118/71  Pulse: 82 85 70 77  Resp: 16 16 18 17   Temp: 98.9 F (37.2 C)   98.2 F (36.8 C)  TempSrc: Oral   Oral  SpO2: 92% 96% 98% 96%  Weight:      Height:        General exam: Appears calm and comfortable.   The results of significant diagnostics from this hospitalization (including imaging, microbiology, ancillary and laboratory) are listed below for reference.     Procedures and Diagnostic Studies:   VAS [place] LOWER EXTREMITY VENOUS (DVT)  Result Date: 12/18/2021  Lower Venous DVT Study Patient Name:  [name] [name]  Date of Exam:   12/18/2021 Medical Rec #: [id number]      Accession #:    [id number] Date of Birth: [birth year]      Patient Gender: M Patient Age:   61 years Exam Location:  [facility] Procedure:      VAS [place] LOWER EXTREMITY VENOUS (DVT) Referring Phys: Ainsley Spinner --------------------------------------------------------------------------------  Indications: Pain, and Edema.  Risk Factors: Left hip fracture. Comparison Study: No prior study Performing Technologist: Maudry Mayhew MHA, RDMS, RVT, RDCS  Examination Guidelines: A complete evaluation includes B-mode imaging, spectral Doppler, color Doppler, and power Doppler as needed of all accessible portions of each vessel. Bilateral testing is considered an integral part of a complete examination. Limited examinations for reoccurring indications may be performed as noted. The reflux portion of the exam is performed with the patient in reverse Trendelenburg.  +-----+---------------+---------+-----------+----------+--------------+ RIGHTCompressibilityPhasicitySpontaneityPropertiesThrombus Aging +-----+---------------+---------+-----------+----------+--------------+ CFV  Full           Yes      Yes                                 +-----+---------------+---------+-----------+----------+--------------+    +---------+---------------+---------+-----------+----------+--------------+ LEFT     CompressibilityPhasicitySpontaneityPropertiesThrombus Aging +---------+---------------+---------+-----------+----------+--------------+ CFV      Full           Yes      Yes                                 +---------+---------------+---------+-----------+----------+--------------+ SFJ      Full                                                        +---------+---------------+---------+-----------+----------+--------------+ FV Prox  Full                                                        +---------+---------------+---------+-----------+----------+--------------+  FV Mid   Full                                                        +---------+---------------+---------+-----------+----------+--------------+ FV DistalFull                                                        +---------+---------------+---------+-----------+----------+--------------+ PFV      Full                                                        +---------+---------------+---------+-----------+----------+--------------+ POP      Full           Yes      Yes                                 +---------+---------------+---------+-----------+----------+--------------+ PTV      Full                                                        +---------+---------------+---------+-----------+----------+--------------+   Left Technical Findings: Not visualized segments include peroneal veins.   Summary: RIGHT: - No evidence of common femoral vein obstruction.  LEFT: - There is no evidence of deep vein thrombosis in the lower extremity. However, portions of this examination were limited- see technologist comments above.  - No cystic structure found in the popliteal fossa.  *See table(s) above for measurements and observations. Electronically signed by Harold Barban MD on 12/18/2021 at 10:35:32 PM.    Final    DG  Hip Port Unilat With Pelvis 1V Left  Result Date: 12/17/2021 CLINICAL DATA:  Fall.  Postoperative left hip arthroplasty EXAM: DG HIP (WITH OR WITHOUT PELVIS) 1V PORT LEFT COMPARISON:  Same-day intraoperative fluoroscopic images. FINDINGS: Patient is status post ORIF of the proximal left femur with short intramedullary rod, distal interlocking screw, and proximal lag screw. Fracture alignment is near anatomic. No new fracture identified. Hardware intact. Degenerative changes of both hips. No dislocation. Expected postoperative changes within the soft tissues lateral to the left hip. IMPRESSION: Status post ORIF of the proximal left femur without apparent postoperative complication. Electronically Signed   By: Davina Poke D.O.   On: 12/17/2021 19:28   DG FEMUR MIN 2 VIEWS LEFT  Result Date: 12/17/2021 CLINICAL DATA:  Left hip ORIF, intraoperative examination EXAM: LEFT FEMUR 2 VIEWS COMPARISON:  12/17/2021 FINDINGS: Left hip ORIF utilizing a gamma nail has been performed with fracture fragments in near anatomic alignment on this limited examination. No unexpected fracture or dislocation. Moderate left hip degenerative arthritis is incidentally noted. Surgical skin staples are noted. Fluoroscopy time: 51 seconds Fluoroscopic images: 4 Fluoroscopic dose: 8.0 mGy IMPRESSION: Status post left hip ORIF. Electronically Signed   By: Linwood Dibbles.D.  On: 12/17/2021 19:14   DG C-Arm 1-60 Min-No Report  Result Date: 12/17/2021 Fluoroscopy was utilized by the requesting physician.  No radiographic interpretation.   CT Cervical Spine Wo Contrast  Result Date: 12/17/2021 CLINICAL DATA:  Trip and fall on a cement curb. EXAM: CT HEAD WITHOUT CONTRAST CT MAXILLOFACIAL WITHOUT CONTRAST CT CERVICAL SPINE WITHOUT CONTRAST TECHNIQUE: Multidetector CT imaging of the head, cervical spine, and maxillofacial structures were performed using the standard protocol without intravenous contrast. Multiplanar CT image  reconstructions of the cervical spine and maxillofacial structures were also generated. RADIATION DOSE REDUCTION: This exam was performed according to the departmental dose-optimization program which includes automated exposure control, adjustment of the mA and/or kV according to patient size and/or use of iterative reconstruction technique. COMPARISON:  CT head dated January 14, 2021 FINDINGS: CT HEAD FINDINGS Brain: No evidence of acute infarction, hemorrhage, hydrocephalus, extra-axial collection or mass lesion/mass effect. Mild cerebral volume loss. Vascular: No hyperdense vessel or unexpected calcification. Skull: Normal. Negative for fracture or focal lesion. Other: None CT MAXILLOFACIAL FINDINGS Osseous: No fracture or mandibular dislocation. No destructive process. Orbits: Negative. No traumatic or inflammatory finding. Bilateral cataract surgery. Sinuses: Clear. Soft tissues: Negative. CT CERVICAL SPINE FINDINGS Alignment: Mild anterolisthesis of C6. Skull base and vertebrae: No acute fracture. No primary bone lesion or focal pathologic process. Soft tissues and spinal canal: No prevertebral fluid or swelling. No visible canal hematoma. Disc levels: Multilevel degenerate disc disease with mild spinal canal stenosis and mild bilateral neural foraminal stenosis at C4-C5. Prominent bulky anterior osteophytes. Upper chest: Negative. Other: None IMPRESSION: 1.  No CT evidence of acute intracranial abnormality. 2.  Mild cerebral volume loss. 3.  No acute cervical spine fracture or traumatic subluxation. 4. Moderate multilevel degenerate disc disease of the cervical spine with prominent anterior osteophytes. Mild spinal canal and neural foraminal stenosis at C4-C5. 5.  No maxillofacial fracture or significant soft tissue injury. Electronically Signed   By: Keane Police D.O.   On: 12/17/2021 12:33   CT Head Wo Contrast  Result Date: 12/17/2021 CLINICAL DATA:  Trip and fall on a cement curb. EXAM: CT HEAD WITHOUT  CONTRAST CT MAXILLOFACIAL WITHOUT CONTRAST CT CERVICAL SPINE WITHOUT CONTRAST TECHNIQUE: Multidetector CT imaging of the head, cervical spine, and maxillofacial structures were performed using the standard protocol without intravenous contrast. Multiplanar CT image reconstructions of the cervical spine and maxillofacial structures were also generated. RADIATION DOSE REDUCTION: This exam was performed according to the departmental dose-optimization program which includes automated exposure control, adjustment of the mA and/or kV according to patient size and/or use of iterative reconstruction technique. COMPARISON:  CT head dated January 14, 2021 FINDINGS: CT HEAD FINDINGS Brain: No evidence of acute infarction, hemorrhage, hydrocephalus, extra-axial collection or mass lesion/mass effect. Mild cerebral volume loss. Vascular: No hyperdense vessel or unexpected calcification. Skull: Normal. Negative for fracture or focal lesion. Other: None CT MAXILLOFACIAL FINDINGS Osseous: No fracture or mandibular dislocation. No destructive process. Orbits: Negative. No traumatic or inflammatory finding. Bilateral cataract surgery. Sinuses: Clear. Soft tissues: Negative. CT CERVICAL SPINE FINDINGS Alignment: Mild anterolisthesis of C6. Skull base and vertebrae: No acute fracture. No primary bone lesion or focal pathologic process. Soft tissues and spinal canal: No prevertebral fluid or swelling. No visible canal hematoma. Disc levels: Multilevel degenerate disc disease with mild spinal canal stenosis and mild bilateral neural foraminal stenosis at C4-C5. Prominent bulky anterior osteophytes. Upper chest: Negative. Other: None IMPRESSION: 1.  No CT evidence of acute intracranial abnormality. 2.  Mild  cerebral volume loss. 3.  No acute cervical spine fracture or traumatic subluxation. 4. Moderate multilevel degenerate disc disease of the cervical spine with prominent anterior osteophytes. Mild spinal canal and neural foraminal stenosis  at C4-C5. 5.  No maxillofacial fracture or significant soft tissue injury. Electronically Signed   By: Keane Police D.O.   On: 12/17/2021 12:33   CT Maxillofacial Wo Contrast  Result Date: 12/17/2021 CLINICAL DATA:  Trip and fall on a cement curb. EXAM: CT HEAD WITHOUT CONTRAST CT MAXILLOFACIAL WITHOUT CONTRAST CT CERVICAL SPINE WITHOUT CONTRAST TECHNIQUE: Multidetector CT imaging of the head, cervical spine, and maxillofacial structures were performed using the standard protocol without intravenous contrast. Multiplanar CT image reconstructions of the cervical spine and maxillofacial structures were also generated. RADIATION DOSE REDUCTION: This exam was performed according to the departmental dose-optimization program which includes automated exposure control, adjustment of the mA and/or kV according to patient size and/or use of iterative reconstruction technique. COMPARISON:  CT head dated January 14, 2021 FINDINGS: CT HEAD FINDINGS Brain: No evidence of acute infarction, hemorrhage, hydrocephalus, extra-axial collection or mass lesion/mass effect. Mild cerebral volume loss. Vascular: No hyperdense vessel or unexpected calcification. Skull: Normal. Negative for fracture or focal lesion. Other: None CT MAXILLOFACIAL FINDINGS Osseous: No fracture or mandibular dislocation. No destructive process. Orbits: Negative. No traumatic or inflammatory finding. Bilateral cataract surgery. Sinuses: Clear. Soft tissues: Negative. CT CERVICAL SPINE FINDINGS Alignment: Mild anterolisthesis of C6. Skull base and vertebrae: No acute fracture. No primary bone lesion or focal pathologic process. Soft tissues and spinal canal: No prevertebral fluid or swelling. No visible canal hematoma. Disc levels: Multilevel degenerate disc disease with mild spinal canal stenosis and mild bilateral neural foraminal stenosis at C4-C5. Prominent bulky anterior osteophytes. Upper chest: Negative. Other: None IMPRESSION: 1.  No CT evidence of acute  intracranial abnormality. 2.  Mild cerebral volume loss. 3.  No acute cervical spine fracture or traumatic subluxation. 4. Moderate multilevel degenerate disc disease of the cervical spine with prominent anterior osteophytes. Mild spinal canal and neural foraminal stenosis at C4-C5. 5.  No maxillofacial fracture or significant soft tissue injury. Electronically Signed   By: Keane Police D.O.   On: 12/17/2021 12:33   DG Hip Unilat W or Wo Pelvis 2-3 Views Left  Result Date: 12/17/2021 CLINICAL DATA:  [place]. Left hip pain. EXAM: DG HIP (WITH OR WITHOUT PELVIS) 2-3V LEFT COMPARISON:  None Available. FINDINGS: Mildly displaced intertrochanteric fracture of the left hip. The right hip is intact. The bony pelvis is intact. The pubic symphysis and SI joints appear normal. IMPRESSION: Mildly displaced intertrochanteric fracture of the left hip. Electronically Signed   By: Marijo Sanes M.D.   On: 12/17/2021 11:53   DG Ribs Unilateral W/Chest Left  Result Date: 12/17/2021 CLINICAL DATA:  [place]. Left rib pain. EXAM: LEFT RIBS AND CHEST - 3+ VIEW COMPARISON:  Chest x-ray 01/14/2021 FINDINGS: The cardiac silhouette, mediastinal and hilar contours are within normal limits. No infiltrates, effusions or pneumothorax. Remote healed right rib fractures are noted. Dedicated views of the left ribs are limited by overlying EKG wires. Suspect remote healed rib fractures and an acute nondisplaced sixth anterior rib fracture. IMPRESSION: 1. No acute cardiopulmonary findings. 2. Suspect nondisplaced left sixth anterior rib fracture. Electronically Signed   By: Marijo Sanes M.D.   On: 12/17/2021 11:52     Labs:   Basic Metabolic Panel: Recent Labs  Lab 12/17/21 1102 12/18/21 0404 12/19/21 0305  NA 143 138 139  K 3.7  3.9 3.7  CL 113* 107 107  CO2 22 22 24   GLUCOSE 83 180* 96  BUN 14 16 19   CREATININE 1.19 1.25* 0.95  CALCIUM 9.3 8.7* 8.7*   GFR Estimated Creatinine Clearance: 84.2 mL/min (by C-G formula based on  SCr of 0.95 mg/dL). Liver Function Tests: Recent Labs  Lab 12/17/21 1102  AST 30  ALT 19  ALKPHOS 126  BILITOT 1.8*  PROT 6.0*  ALBUMIN 3.6   No results for input(s): "LIPASE", "AMYLASE" in the last 168 hours. No results for input(s): "AMMONIA" in the last 168 hours. Coagulation profile Recent Labs  Lab 12/17/21 1340  INR 1.3*    CBC: Recent Labs  Lab 12/17/21 1102 12/18/21 0404 12/19/21 0305 12/19/21 1410 12/20/21 0548 12/20/21 1951 12/21/21 0142  WBC 5.1 6.3 5.8  --  4.7  --  4.7  NEUTROABS 3.8  --   --   --   --   --   --   HGB 11.0* 8.8* 7.8* 8.1* 7.6* 8.6* 8.4*  HCT 32.6* 25.7* 23.1* 24.9* 22.2* 24.2* 25.3*  MCV 95.0 95.2 95.9  --  93.7  --  93.4  PLT PLATELET CLUMPS NOTED ON SMEAR, UNABLE TO ESTIMATE 71* 71*  --  69*  --  77*   Cardiac Enzymes: No results for input(s): "CKTOTAL", "CKMB", "CKMBINDEX", "TROPONINI" in the last 168 hours. BNP: Invalid input(s): "POCBNP" CBG: Recent Labs  Lab 12/20/21 1540 12/20/21 1854 12/20/21 2120 12/21/21 0739 12/21/21 1124  GLUCAP 77 147* 105* 97 126*   D-Dimer No results for input(s): "DDIMER" in the last 72 hours. Hgb A1c No results for input(s): "HGBA1C" in the last 72 hours. Lipid Profile No results for input(s): "CHOL", "HDL", "LDLCALC", "TRIG", "CHOLHDL", "LDLDIRECT" in the last 72 hours. Thyroid function studies No results for input(s): "TSH", "T4TOTAL", "T3FREE", "THYROIDAB" in the last 72 hours.  Invalid input(s): "FREET3" Anemia work up No results for input(s): "VITAMINB12", "FOLATE", "FERRITIN", "TIBC", "IRON", "RETICCTPCT" in the last 72 hours. Microbiology Recent Results (from the past 240 hour(s))  Urine Culture     Status: Abnormal   Collection Time: 12/17/21  2:11 PM   Specimen: Urine, Clean Catch  Result Value Ref Range Status   Specimen Description URINE, CLEAN CATCH  Final   Special Requests   Final    NONE Performed at [facility], [address] [place], [place] [address]     Culture MULTIPLE SPECIES PRESENT, SUGGEST RECOLLECTION (A)  Final   Report Status 12/18/2021 FINAL  Final     Discharge Instructions:   Discharge Instructions     Discharge instructions   Complete by: As directed    DYS 3 diet   Increase activity slowly   Complete by: As directed    No wound care   Complete by: As directed       Allergies as of 12/21/2021       Reactions   Sulfonamide Derivatives Hives   Z-pak [azithromycin] Swelling   Dilaudid [hydromorphone Hcl] Nausea And Vomiting   Sulfa Antibiotics Hives, Rash        Medication List     TAKE these medications    acetaminophen 500 MG tablet Commonly known as: TYLENOL Take 2 tablets (1,000 mg total) by mouth 3 (three) times daily. What changed:  when to take this reasons to take this   acitretin 25 MG capsule Commonly known as: [name] Take 25 mg by mouth daily.   CALCIUM PO Take 1 tablet by mouth daily.  cycloSPORINE 0.05 % ophthalmic emulsion Commonly known as: RESTASIS Place 1 drop into both eyes 2 (two) times daily as needed (dry eyes).   DAIRY RELIEF PO Take 1 tablet by mouth in the morning and at bedtime.   desonide 0.05 % ointment Commonly known as: DESOWEN Apply 1 Application topically 3 (three) times daily. Mix with Mupirocin 2%. To lower lip.   diclofenac Sodium 1 % Gel Commonly known as: VOLTAREN Apply 2 g topically 4 (four) times daily.   docusate sodium 100 MG capsule Commonly known as: COLACE Take 1 capsule (100 mg total) by mouth 2 (two) times daily.   enoxaparin 40 MG/0.4ML injection Commonly known as: LOVENOX Inject 0.4 mLs (40 mg total) into the skin daily for 21 days. Start taking on: December 22, 2021   esomeprazole 40 MG capsule Commonly known as: NEXIUM Take 1 capsule (40 mg total) by mouth 2 (two) times daily before a meal.   Eszopiclone 3 MG Tabs Take 3 mg by mouth at bedtime as needed (sleep).   feeding supplement Liqd Take 237 mLs by mouth 2 (two) times daily  between meals.   folic acid 1 MG tablet Commonly known as: FOLVITE Take 2 tablets (2 mg total) by mouth daily.   hydrocortisone 2.5 % cream Apply 1 Application topically 2 (two) times daily as needed (Itchy skin).   lamoTRIgine 150 MG tablet Commonly known as: LaMICtal Take 1 tablet (150 mg total) by mouth 2 (two) times daily.   latanoprost 0.005 % ophthalmic solution Commonly known as: XALATAN Place 1 drop into both eyes at bedtime.   Libtayo 350 MG/7ML Soln injection Generic drug: cemiplimab-rwlc Inject into the vein every 6 (six) weeks.   lidocaine 5 % Commonly known as: LIDODERM Place 1 patch onto the skin daily. Remove & Discard patch within 12 hours or as directed by MD   mesalamine 1.2 g EC tablet Commonly known as: LIALDA Take 2 tablets (2.4 g total) by mouth 2 (two) times daily.   methocarbamol 750 MG tablet Commonly known as: ROBAXIN Take 1 tablet (750 mg total) by mouth 3 (three) times daily.   methscopolamine 5 MG tablet Commonly known as: PAMINE FORTE Take 1 tablet (5 mg total) by mouth 2 (two) times daily.   mupirocin ointment 2 % Commonly known as: BACTROBAN Apply 1 Application topically 3 (three) times daily. Mix with Desonide 0.05%. To lower lip.   Oxycodone HCl 10 MG Tabs Take 1-1.5 tablets (10-15 mg total) by mouth every 4 (four) hours as needed for severe pain.   potassium chloride SA 20 MEQ tablet Commonly known as: KLOR-CON M Take 1 tablet (20 mEq total) by mouth 2 (two) times daily.   PROBIOTIC PO Take 1 tablet at bedtime by mouth.   rifaximin 550 MG Tabs tablet Commonly known as: Xifaxan Take 1 tablet (550 mg total) by mouth 2 (two) times daily.   sertraline 100 MG tablet Commonly known as: ZOLOFT Take 200 mg daily with breakfast by mouth.   tamsulosin 0.4 MG Caps capsule Commonly known as: FLOMAX Take 0.4 mg by mouth at bedtime.   tolterodine 4 MG 24 hr capsule Commonly known as: DETROL LA Take 4 mg by mouth daily.    traZODone 150 MG tablet Commonly known as: DESYREL Take 150 mg by mouth at bedtime.   VITAMIN D-3 PO Take 1 capsule by mouth daily.        Follow-up Information     Altamese Patterson, MD. Schedule an appointment as soon as  possible for a visit in 2 week(s).   Specialty: Orthopedic Surgery Contact information: [place] [address] [phone]                  Time coordinating discharge: 45 min  Signed:  Geradine Girt DO  [facility] 12/21/2021, 11:56 AM

## 2021-12-21 NOTE — Progress Notes (Signed)
RT NOTE:  Pt refuses CPAP tonight. He was not able to adjust our mask to a comfortable fit. He is going home tomorrow. Will leave CPAP at bedside in case patient changes his mind.

## 2021-12-21 NOTE — Progress Notes (Signed)
Occupational Therapy Treatment Patient Details Name: BRACK SHADDOCK MRN: [id number] DOB: Dec 16, 1960 Today's Date: 12/21/2021   History of present illness Pt is a 61 y/o male who presents to acute care PT s/p IM nailing to L hip after fall. Pt also with likely left 6th anterior rib fracture. Pt has PMH of seizure disorder, Crohn's, anxiety and depression, DM, liver cirrhosis, GERD, and HTN.   OT comments  Pt. Seen for skilled OT treatment session.  Pt. Motivated and eager to participate.  Able to complete bed mobility, lb dressing, and toileting tasks min/mod a.  Reports good family support at home via wife.  Agree with current d/c recommendations.    Recommendations for follow up therapy are one component of a multi-disciplinary discharge planning process, led by the attending physician.  Recommendations may be updated based on patient status, additional functional criteria and insurance authorization.    Follow Up Recommendations  Skilled nursing-short term rehab (<3 hours/day)    Assistance Recommended at Discharge Frequent or constant Supervision/Assistance  Patient can return home with the following  A lot of help with walking and/or transfers;A lot of help with bathing/dressing/bathroom;Assistance with cooking/housework;Help with stairs or ramp for entrance   Equipment Recommendations  BSC/3in1;Other (comment)    Recommendations for Other Services      Precautions / Restrictions Precautions Precautions: Fall Restrictions LLE Weight Bearing: Weight bearing as tolerated       Mobility Bed Mobility Overal bed mobility: Needs Assistance Bed Mobility: Supine to Sit     Supine to sit: Mod assist     General bed mobility comments: Pt required use of bed rail and HHA for supine to sit and patient assist for his LE's on sit to supine.-educated on how to manage them toward and out of bed. did well but heavy realiance for trunk support.    Transfers Overall transfer level: Needs  assistance Equipment used: Rolling walker (2 wheels) Transfers: Sit to/from Stand, Bed to chair/wheelchair/BSC Sit to Stand: Min assist, Mod assist Stand pivot transfers: Mod assist   Step pivot transfers: Mod assist     General transfer comment: Pt required minimal assistance with sit to stands from bed and moderate assistance from chair.  also chair to 3n1. cues for management of LLE to ease pain when sitting difficulty due to tread on the socks     Balance                                           ADL either performed or assessed with clinical judgement   ADL Overall ADL's : Needs assistance/impaired                     Lower Body Dressing: Moderate assistance;Sit to/from stand Lower Body Dressing Details (indicate cue type and reason): able to cross RLE well though increased pain/decreased flexibility with L LE Toilet Transfer: Minimal assistance;Stand-pivot;BSC/3in1;Rolling walker (2 wheels)           Functional mobility during ADLs: Minimal assistance General ADL Comments: pt. reports wife can assist with LB adls prn    Extremity/Trunk Assessment              Vision       Perception     Praxis      Cognition Arousal/Alertness: Awake/alert Behavior During Therapy: WFL for tasks assessed/performed Overall Cognitive Status: Within Functional Limits for tasks  assessed                                          Exercises      Shoulder Instructions       General Comments      Pertinent Vitals/ Pain       Pain Assessment Pain Assessment: 0-10 Pain Score: 6  Pain Location: left hip and left ribs Pain Descriptors / Indicators: Guarding, Moaning Pain Intervention(s): Monitored during session, Repositioned  Home Living                                          Prior Functioning/Environment              Frequency  Min 2X/week        Progress Toward Goals  OT Goals(current goals  can now be found in the care plan section)  Progress towards OT goals: Progressing toward goals     Plan Discharge plan remains appropriate    Co-evaluation                 AM-PAC OT "6 Clicks" Daily Activity     Outcome Measure   Help from another person eating meals?: None Help from another person taking care of personal grooming?: A Little Help from another person toileting, which includes using toliet, bedpan, or urinal?: A Lot Help from another person bathing (including washing, rinsing, drying)?: A Lot Help from another person to put on and taking off regular upper body clothing?: A Little Help from another person to put on and taking off regular lower body clothing?: A Lot 6 Click Score: 16    End of Session Equipment Utilized During Treatment: Gait belt;Rolling walker (2 wheels)  OT Visit Diagnosis: Other abnormalities of gait and mobility (R26.89);Unsteadiness on feet (R26.81);Muscle weakness (generalized) (M62.81);Pain Pain - Right/Left: Left Pain - part of body: Hip;Leg   Activity Tolerance Patient tolerated treatment well   Patient Left Other (comment) (pt. on 3n1 cna present making bed available to asssist when pt. finished using the b.room)   Nurse Communication Other (comment) (rn present at beg. of session, cna present at end of session)        Time: 1115-5208 OT Time Calculation (min): 23 min  Charges: OT General Charges $OT Visit: 1 Visit OT Treatments $Self Care/Home Management : 23-37 mins  Sonia Baller, COTA/L Acute Rehabilitation [phone]   Tanya Nones 12/21/2021, 12:28 PM

## 2021-12-21 NOTE — TOC Progression Note (Addendum)
Transition of Care [facility]) - Progression Note    Patient Details  Name: [name] [name] MRN: [id number] Date of Birth: [birth year]  Transition of Care [facility]) CM/SW [place], [place] Phone Number: 12/21/2021, 1:38 PM  Clinical Narrative:    Spouse spoke with insurance company and requested open claims be closed, they currently have a ticket in to do so, but stated they have up to 21 days. CSW spoke with spouse about options. Spouse states that if pt is able to make some progress over the weekend and become more mobile, then she may be able to take him home. If he has not improved, she may be able to private pay for a week, and that might give her enough time to recuperate from her chemo treatments. TOC supervisor stating to hold DC for the weekend in order to give family enough time to rectify the issue. Will re evaluate Monday for disposition.  Pt is ready to DC, [place] notified facility. Pt has Medicare as primary and [employer] as secodary. SNF ran benefits and noted [employer] was causing a barrier to Medicare paying for SNF. After investigating, pt has an open claim that needs to be closed in order to proceed. CSW notified spouse who states they saw a provider who did not know how to bill Medicare, instead of closing out the claim, it was left open. Spouse is currently trying to get claim removed but, it is not an immediate process. Medical team and [facility] leadership notified of barriers. Pt is currently requiring a lot of care and spouse is getting chemotherapy. She stated to CSW that she doesn't think she can care for him at home, as she had just gotten another round of chemo yesterday. TOC will continue to follow.    Expected Discharge Plan: [place] Barriers to Discharge: Continued Medical Work up, SNF Pending bed offer  Expected Discharge Plan and Services Expected Discharge Plan: [place] Choice: [place] arrangements  for the past 2 months: Single Family Home Expected Discharge Date: 12/21/21                                     Social Determinants of Health (SDOH) Interventions    Readmission Risk Interventions     No data to display

## 2021-12-21 NOTE — Discharge Instructions (Signed)
Orthopaedic Trauma Service Discharge Instructions   General Discharge Instructions  Orthopaedic Injuries:  Left hip fracture treated with intramedullary nailing  WEIGHT BEARING STATUS: Weightbearing as tolerated left leg with assistance   RANGE OF MOTION/ACTIVITY: unrestricted range of motion left hip and knee  Bone health: vitamin d levels look ok however a hip fracture is suggestive of osteoporosis.  Would recommend bone density scan in 1-2 months   Review the following resource for additional information regarding bone health  [email or web address]  Wound Care: daily wound care as needed left hip. Ok to leave incision open to air once there is no drainage  Discharge Wound Care Instructions  Do NOT apply any ointments, solutions or lotions to pin sites or surgical wounds.  These prevent needed drainage and even though solutions like hydrogen peroxide kill bacteria, they also damage cells lining the pin sites that help fight infection.  Applying lotions or ointments can keep the wounds moist and can cause them to breakdown and open up as well. This can increase the risk for infection. When in doubt call the office.  Surgical incisions should be dressed daily.  If any drainage is noted, use one layer of adaptic or Mepitel, then gauze and tape. Alternatively, you can use a mepilex (silicone foam) dressing  [email or web address]  [email or web address].gl  These dressing supplies should be available at local medical supply stores ([employer], [employer], etc). They are not usually carried at places like [facility], [facility], [employer], etc  Once the incision is completely dry and without drainage, it may be left open to air out.   Showering may begin 36-48 hours later.  Cleaning gently with soap and water.    DVT/PE prophylaxis: Lovenox 40 mg sq daily x 21 days   Diet: as you were eating previously.  Can use over the counter stool softeners and bowel preparations, such as Miralax, to help with bowel movements.  Narcotics can be constipating.  Be sure to drink plenty of fluids  PAIN MEDICATION USE AND EXPECTATIONS  You have likely been given narcotic medications to help control your pain.  After a traumatic event that results in an fracture (broken bone) with or without surgery, it is ok to use narcotic pain medications to help control one's pain.  We understand that everyone responds to pain differently and each individual patient will be evaluated on a regular basis for the continued need for narcotic medications. Ideally, narcotic medication use should last no more than 6-8 weeks (coinciding with fracture healing).   As a patient it is your responsibility as well to monitor narcotic medication use and report the amount and frequency you use these medications when you come to your office visit.   We would also advise that if you are using narcotic medications, you should take a dose prior to therapy to maximize you participation.  IF YOU ARE ON NARCOTIC MEDICATIONS IT IS NOT PERMISSIBLE TO OPERATE A MOTOR VEHICLE (MOTORCYCLE/CAR/TRUCK/MOPED) OR HEAVY MACHINERY DO NOT MIX NARCOTICS WITH OTHER CNS (CENTRAL NERVOUS SYSTEM) DEPRESSANTS SUCH AS ALCOHOL   POST-OPERATIVE OPIOID TAPER INSTRUCTIONS: It is important to wean off of your opioid medication as soon as possible. If you do not need pain medication after your surgery it is ok to stop day one. Opioids include: Codeine, Hydrocodone(Norco, Vicodin), Oxycodone(Percocet, oxycontin) and hydromorphone amongst others.  Long term and even short term use of opiods can cause: Increased pain response Dependence Constipation Depression Respiratory depression And more.   Withdrawal symptoms can include Flu like  symptoms Nausea, vomiting And more Techniques to manage these symptoms Hydrate well Eat regular healthy meals Stay active Use relaxation techniques(deep breathing, meditating, yoga) Do Not substitute Alcohol to help with tapering If you have been on opioids for less than two weeks and do not have pain than it is ok to stop all together.  Plan to wean off of opioids This plan should start within one week post op of your fracture surgery  Maintain the same interval or time between taking each dose and first decrease the dose.  Cut the total daily intake of opioids by one tablet each day Next start to increase the time between doses. The last dose that should be eliminated is the evening dose.    STOP SMOKING OR USING NICOTINE PRODUCTS!!!!  As discussed nicotine severely impairs your body's ability to heal surgical and traumatic wounds but also impairs bone healing.  Wounds and bone heal by forming microscopic blood vessels (angiogenesis) and nicotine is a vasoconstrictor (essentially, shrinks blood vessels).  Therefore, if vasoconstriction occurs to these microscopic blood vessels they essentially disappear and are unable to deliver necessary nutrients to the healing tissue.  This is one modifiable factor that you can do to dramatically increase your chances of healing your injury.    (This means no smoking, no nicotine gum, patches, etc)  DO NOT USE NONSTEROIDAL ANTI-INFLAMMATORY DRUGS (NSAID'S)  Using products such as Advil (ibuprofen), Aleve (naproxen), Motrin (ibuprofen) for additional pain control during fracture healing can delay and/or prevent the healing response.  If you would like to take over the counter (OTC) medication, Tylenol (acetaminophen) is ok.  However, some narcotic medications that are given for pain control contain acetaminophen as well. Therefore, you should not exceed more than 4000 mg of tylenol in a day if you do not have  liver disease.  Also note that there are may OTC medicines, such as cold medicines and allergy medicines that my contain tylenol as well.  If you have any questions about medications and/or interactions please ask your doctor/PA or your pharmacist.      ICE AND ELEVATE INJURED/OPERATIVE EXTREMITY  Using ice and elevating the injured extremity above your heart can help with swelling and pain control.  Icing in a pulsatile fashion, such as 20 minutes on and 20 minutes off, can be followed.    Do not place ice directly on skin. Make sure there is a barrier between to skin and the ice pack.    Using frozen items such as frozen peas works well as the conform nicely to the are that needs to be iced.  USE AN ACE WRAP OR TED HOSE FOR SWELLING CONTROL  In addition to icing and elevation, Ace wraps or TED hose are used to help limit and resolve swelling.  It is recommended to use Ace wraps or TED hose until you are informed to stop.    When using Ace Wraps start the wrapping distally (farthest away from the body) and wrap proximally (closer to the body)   Example: If you had surgery on your leg or thing and you do not have a splint on, start the ace wrap at the toes and work your way up to the thigh        If you had surgery on your upper extremity and do not have a splint on, start the ace wrap at your fingers and work your way up to the upper arm  IF YOU ARE IN A SPLINT OR CAST DO  NOT REMOVE IT FOR ANY REASON   If your splint gets wet for any reason please contact the office immediately. You may shower in your splint or cast as long as you keep it dry.  This can be done by wrapping in a cast cover or garbage back (or similar)  Do Not stick any thing down your splint or cast such as pencils, money, or hangers to try and scratch yourself with.  If you feel itchy take benadryl as prescribed on the bottle for itching  IF YOU ARE IN A CAM BOOT (BLACK BOOT)  You may remove boot periodically. Perform daily  dressing changes as noted below.  Wash the liner of the boot regularly and wear a sock when wearing the boot. It is recommended that you sleep in the boot until told otherwise    Call office for the following: Temperature greater than 101F Persistent nausea and vomiting Severe uncontrolled pain Redness, tenderness, or signs of infection (pain, swelling, redness, odor or green/yellow discharge around the site) Difficulty breathing, headache or visual disturbances Hives Persistent dizziness or light-headedness Extreme fatigue Any other questions or concerns you may have after discharge  In an emergency, call 911 or go to an Emergency Department at a nearby hospital  HELPFUL INFORMATION  If you had a block, it will wear off between 8-24 hrs postop typically.  This is period when your pain may go from nearly zero to the pain you would have had postop without the block.  This is an abrupt transition but nothing dangerous is happening.  You may take an extra dose of narcotic when this happens.  You should wean off your narcotic medicines as soon as you are able.  Most patients will be off or using minimal narcotics before their first postop appointment.   We suggest you use the pain medication the first night prior to going to bed, in order to ease any pain when the anesthesia wears off. You should avoid taking pain medications on an empty stomach as it will make you nauseous.  Do not drink alcoholic beverages or take illicit drugs when taking pain medications.  In most states it is against the law to drive while you are in a splint or sling.  And certainly against the law to drive while taking narcotics.  You may return to work/school in the next couple of days when you feel up to it.   Pain medication may make you constipated.  Below are a few solutions to try in this order: Decrease the amount of pain medication if you aren't having pain. Drink lots of decaffeinated fluids. Drink prune  juice and/or each dried prunes  If the first 3 don't work start with additional solutions Take Colace - an over-the-counter stool softener Take Senokot - an over-the-counter laxative Take Miralax - a stronger over-the-counter laxative     CALL THE OFFICE WITH ANY QUESTIONS OR CONCERNS: [phone]   VISIT OUR WEBSITE FOR ADDITIONAL INFORMATION: [email or web address]

## 2021-12-21 NOTE — Progress Notes (Signed)
D/c held due to insurance/auth/SNF issues Defer to [facility] as he is medically ready to d/c [name] [name]

## 2021-12-22 DIAGNOSIS — S72145A Nondisplaced intertrochanteric fracture of left femur, initial encounter for closed fracture: Secondary | ICD-10-CM | POA: Diagnosis not present

## 2021-12-22 LAB — GLUCOSE, CAPILLARY
Glucose-Capillary: 91 mg/dL (ref 70–99)
Glucose-Capillary: 119 mg/dL — ABNORMAL HIGH (ref 70–99)
Glucose-Capillary: 97 mg/dL (ref 70–99)
Glucose-Capillary: 99 mg/dL (ref 70–99)

## 2021-12-22 MED ORDER — ENOXAPARIN SODIUM 40 MG/0.4ML IJ SOSY
40.0000 mg | PREFILLED_SYRINGE | INTRAMUSCULAR | Status: DC
Start: 2021-12-22 — End: 2021-12-22

## 2021-12-22 NOTE — Plan of Care (Signed)
  Problem: Health Behavior/Discharge Planning: Goal: Ability to manage health-related needs will improve Outcome: Progressing   

## 2021-12-22 NOTE — Progress Notes (Signed)
Mobility Specialist Criteria Algorithm Info.   12/22/21 1425  Mobility  Activity Refused mobility;Moved into chair position in bed (LE AROM; Repositioned in chair)  Range of Motion/Exercises Active;Passive  Level of Assistance Standby assist, set-up cues, supervision of patient - no hands on  Assistive Device None  Activity Response Tolerated well   Patient received in recliner chair reluctant to participate in mobility. Defer ambulation at this time d/t hip LLE pain/cramping. Agreed to demo LE AROM. Completed with supervision and tolerated without complaint or incident. Was left in recliner with all needs met, call bell in reach.   12/22/2021 2:35 PM  [place] Leonette Tischer, [place], [place]  QUIQN:[phone] Office: [phone]

## 2021-12-22 NOTE — Progress Notes (Signed)
PROGRESS NOTE   [name] [name]  BMW:[id number] DOB: Sep 07, 1960 DOA: 12/17/2021 PCP: [name], MD    Brief Narrative:  60 year old white male, known history DM TY 2, seizures who became dizzy outside his home on 6/26 tripped on a curb fell onto left hip, developed a contusion under right eye Found to have left hip fracture in addition to nondisplaced left sixth rib fracture. He is s/p repair on 6/26. Stay complicated by low hgb and continued pain and NOW insurance issues.   Hospital-Problem based course  Left hip fracture -Status post IM nail left hip 6/26 Dr. Marcelino Scot -drop in h/h - s/p 1 unit PRBC -pain control -incentive spirometry  -no DVT on u/s  Prior diagnosis of squamous cell CA right cheek + XRT status post Libtayo  Interval surveillance at this time-consideration as an outpatient to continue Libtayo versus not given underlying Crohn's disease which can worsen from this medication  follow-up with Dr. Salley Scarlet of dermatology and ENT Dr. Barton Dubois--- continue [name] 25 daily Seems to be maintained on dysphagia 3 diet  Anemia of expected blood loss from surgery -transfused  AKI secondary to perioperative state  -hold IVF  -Cr improved  History of complex partial seizures Continue Lamictal 150 twice daily  DM TY 2 -SSI  Crohn's disease followed by Dr. Fuller Plan Continue Lialda 2.4 daily, methscopolamine 5 mg twice daily Continue lactase 3000 twice daily Continue Protonix 40 twice daily  BPH Continue Detrol LA 8 daily, Flomax 0.4  Liver cirrhosis with mass No ascites AFP 38 Undergoing work-up and was to be seen 12/12/2021 for liver directed therapies Outpatient follow-up with oncology Continue rifampin 550 twice daily for presumed prevention of hepatic encephalopathy  Depression Follow-up as an outpatient with Dr. Darleene Cleaver continue trazodone 150 at bedtime sertraline 1 200 every morning   DVT prophylaxis: scd Code Status: Full code Family Communication: at  bedside 6/30 Disposition:  Status is: Inpatient Remains inpatient appropriate because:   Trend h/h, await SNF- apparently there are issues with insurance    Consultants:  orthopedics   Procedures: IM nail left hip 6/26   Subjective: C/o rib pain  Objective: Vitals:   12/21/21 2055 12/21/21 2100 12/22/21 0601 12/22/21 0735  BP: 120/69 120/69 117/70 117/69  Pulse: 71 71 70 69  Resp:  20 18 17   Temp: 98.5 F (36.9 C) 98.5 F (36.9 C) 98.5 F (36.9 C) 98.7 F (37.1 C)  TempSrc: Oral Oral Oral Oral  SpO2: 100% 100% 99% 99%  Weight:      Height:        Intake/Output Summary (Last 24 hours) at 12/22/2021 1144 Last data filed at 12/22/2021 0530 Gross per 24 hour  Intake 370 ml  Output 200 ml  Net 170 ml   Filed Weights   12/17/21 1053 12/17/21 1419  Weight: 72 kg 72 kg    Examination:   General: Appearance:    Chronically ill appearing male in no acute distress     Lungs:     respirations unlabored  Heart:    Normal heart rate.   MS:   All extremities are intact.   Neurologic:   Awake, alert         Data Reviewed: personally reviewed   CBC    Component Value Date/Time   WBC 4.7 12/21/2021 0142   RBC 2.71 (L) 12/21/2021 0142   HGB 8.4 (L) 12/21/2021 0142   HGB 9.7 (L) 07/12/2021 1118   HGB 12.3 (L) 07/12/2019 0900   HCT 25.3 (  L) 12/21/2021 0142   HCT 34.6 (L) 07/12/2019 0900   PLT 77 (L) 12/21/2021 0142   PLT 118 (L) 07/12/2021 1118   PLT 90 (LL) 07/12/2019 0900   MCV 93.4 12/21/2021 0142   MCV 108 (H) 07/12/2019 0900   MCH 31.0 12/21/2021 0142   MCHC 33.2 12/21/2021 0142   RDW 14.3 12/21/2021 0142   RDW 14.8 07/12/2019 0900   LYMPHSABS 0.5 (L) 12/17/2021 1102   LYMPHSABS 0.6 (L) 12/28/2018 1350   MONOABS 0.5 12/17/2021 1102   EOSABS 0.2 12/17/2021 1102   EOSABS 0.1 12/28/2018 1350   BASOSABS 0.0 12/17/2021 1102   BASOSABS 0.0 12/28/2018 1350      Latest Ref Rng & Units 12/19/2021    3:05 AM 12/18/2021    4:04 AM 12/17/2021   11:02 AM  CMP   Glucose 70 - 99 mg/dL 96  180  83   BUN 6 - 20 mg/dL 19  16  14    Creatinine 0.61 - 1.24 mg/dL 0.95  1.25  1.19   Sodium 135 - 145 mmol/L 139  138  143   Potassium 3.5 - 5.1 mmol/L 3.7  3.9  3.7   Chloride 98 - 111 mmol/L 107  107  113   CO2 22 - 32 mmol/L 24  22  22    Calcium 8.9 - 10.3 mg/dL 8.7  8.7  9.3   Total Protein 6.5 - 8.1 g/dL   6.0   Total Bilirubin 0.3 - 1.2 mg/dL   1.8   Alkaline Phos 38 - 126 U/L   126   AST 15 - 41 U/L   30   ALT 0 - 44 U/L   19      Radiology Studies: No results found.   Scheduled Meds:  sodium chloride   Intravenous Once   acetaminophen  1,000 mg Oral TID   acitretin  25 mg Oral Daily   Chlorhexidine Gluconate Cloth  6 each Topical Daily   diclofenac Sodium  2 g Topical QID   docusate sodium  100 mg Oral BID   enoxaparin (LOVENOX) injection  40 mg Subcutaneous Q24H   feeding supplement  237 mL Oral TID BM   fesoterodine  8 mg Oral Daily   folic acid  2 mg Oral Daily   insulin aspart  0-9 Units Subcutaneous TID WC   lactase  3,000 Units Oral BID   lamoTRIgine  150 mg Oral BID   latanoprost  1 drop Both Eyes QHS   lidocaine  1 patch Transdermal Q24H   mesalamine  2.4 g Oral Daily   methocarbamol  750 mg Oral TID   methscopolamine  5 mg Oral BID   multivitamin with minerals  1 tablet Oral Daily   mupirocin ointment  1 Application Topical TID   pantoprazole  40 mg Oral BID AC   potassium chloride SA  20 mEq Oral BID   rifaximin  550 mg Oral BID   sertraline  200 mg Oral Q breakfast   sodium chloride flush  10-40 mL Intracatheter Q12H   tamsulosin  0.4 mg Oral QHS   traZODone  150 mg Oral QHS   Continuous Infusions:     LOS: 5 days   Time spent: [place], DO [facility]   12/22/2021, 11:44 AM

## 2021-12-22 NOTE — Progress Notes (Signed)
Patient refused CPAP for tonight. RT instructed pt to have RT called if he changes his mind. RT will monitor as needed. 

## 2021-12-22 NOTE — Plan of Care (Signed)

## 2021-12-23 DIAGNOSIS — S72145A Nondisplaced intertrochanteric fracture of left femur, initial encounter for closed fracture: Secondary | ICD-10-CM | POA: Diagnosis not present

## 2021-12-23 LAB — GLUCOSE, CAPILLARY
Glucose-Capillary: 81 mg/dL (ref 70–99)
Glucose-Capillary: 100 mg/dL — ABNORMAL HIGH (ref 70–99)
Glucose-Capillary: 80 mg/dL (ref 70–99)

## 2021-12-23 LAB — CBC
HCT: 24.3 % — ABNORMAL LOW (ref 39.0–52.0)
Hemoglobin: 8.3 g/dL — ABNORMAL LOW (ref 13.0–17.0)
MCH: 32 pg (ref 26.0–34.0)
MCHC: 34.2 g/dL (ref 30.0–36.0)
MCV: 93.8 fL (ref 80.0–100.0)
Platelets: 95 10*3/uL — ABNORMAL LOW (ref 150–400)
RBC: 2.59 MIL/uL — ABNORMAL LOW (ref 4.22–5.81)
RDW: 14.4 % (ref 11.5–15.5)
WBC: 3.7 10*3/uL — ABNORMAL LOW (ref 4.0–10.5)
nRBC: 0 % (ref 0.0–0.2)

## 2021-12-23 MED ORDER — ENOXAPARIN SODIUM 40 MG/0.4ML IJ SOSY
40.0000 mg | PREFILLED_SYRINGE | INTRAMUSCULAR | Status: DC
Start: 2021-12-23 — End: 2021-12-24
  Administered 2021-12-23: 40 mg via SUBCUTANEOUS
  Filled 2021-12-23: qty 0.4

## 2021-12-23 NOTE — Plan of Care (Signed)
  Problem: Health Behavior/Discharge Planning: Goal: Ability to manage health-related needs will improve Outcome: Progressing   

## 2021-12-23 NOTE — Progress Notes (Signed)
Patient continues to refuse CPAP. RT removed CPAP from room at this time. RT instructed patient to have RT called if he decides he wants to wear one. RT will monitor as needed.

## 2021-12-23 NOTE — Plan of Care (Signed)
  Problem: Education: Goal: Ability to describe self-care measures that may prevent or decrease complications (Diabetes Survival Skills Education) will improve Outcome: Progressing   Problem: Nutritional: Goal: Maintenance of adequate nutrition will improve Outcome: Progressing   Problem: Skin Integrity: Goal: Risk for impaired skin integrity will decrease Outcome: Progressing

## 2021-12-23 NOTE — Progress Notes (Signed)
PROGRESS NOTE   [name] [name]  NFA:[id number] DOB: 01/04/1961 DOA: 12/17/2021 PCP: [name], MD    Brief Narrative:  61 year old white male, known history DM TY 2, seizures who became dizzy outside his home on 6/26 tripped on a curb fell onto left hip, developed a contusion under right eye Found to have left hip fracture in addition to nondisplaced left sixth rib fracture. He is s/p repair on 6/26. Stay complicated by low hgb and continued pain and NOW insurance issues.   Hospital-Problem based course  Left hip fracture -Status post IM nail left hip 6/26 Dr. Marcelino Scot -drop in h/h - s/p 1 unit PRBC -pain control -incentive spirometry  -no DVT on u/s  Prior diagnosis of squamous cell CA right cheek + XRT status post Libtayo  Interval surveillance at this time-consideration as an outpatient to continue Libtayo versus not given underlying Crohn's disease which can worsen from this medication  follow-up with Dr. Salley Scarlet of dermatology and ENT Dr. Barton Dubois--- continue [name] 25 daily Seems to be maintained on dysphagia 3 diet  Anemia of expected blood loss from surgery -transfused  AKI secondary to perioperative state  -hold IVF  -Cr improved  History of complex partial seizures Continue Lamictal 150 twice daily  DM TY 2 -SSI  Crohn's disease followed by Dr. Fuller Plan Continue Lialda 2.4 daily, methscopolamine 5 mg twice daily Continue lactase 3000 twice daily Continue Protonix 40 twice daily  BPH Continue Detrol LA 8 daily, Flomax 0.4  Liver cirrhosis with mass No ascites AFP 38 Undergoing work-up and was to be seen 12/12/2021 for liver directed therapies Outpatient follow-up with oncology Continue rifampin 550 twice daily for presumed prevention of hepatic encephalopathy  Depression Follow-up as an outpatient with Dr. Darleene Cleaver continue trazodone 150 at bedtime sertraline 1 200 every morning   DVT prophylaxis: scd Code Status: Full code Family Communication: at  bedside 6/30 Disposition:  Status is: Inpatient Remains inpatient appropriate because:   Trend h/h, await SNF- apparently there are issues with insurance    Consultants:  orthopedics   Procedures: IM nail left hip 6/26   Subjective: Feeling better today  Objective: Vitals:   12/22/21 1506 12/22/21 2011 12/22/21 2100 12/23/21 0723  BP: 112/65 121/72 121/72 120/73  Pulse: 70 69 69 62  Resp: 17 18 18 17   Temp: 98.6 F (37 C) 98.7 F (37.1 C) 98.1 F (36.7 C) 98.2 F (36.8 C)  TempSrc: Oral Oral Oral Oral  SpO2: 99% 100% 100% 97%  Weight:      Height:        Intake/Output Summary (Last 24 hours) at 12/23/2021 1150 Last data filed at 12/23/2021 0943 Gross per 24 hour  Intake 490 ml  Output 1050 ml  Net -560 ml   Filed Weights   12/17/21 1053 12/17/21 1419  Weight: 72 kg 72 kg    Examination:   General: Appearance:    Chronically ill appearing male in no acute distress     Lungs:     respirations unlabored  Heart:    Normal heart rate.   MS:   All extremities are intact.   Neurologic:   Awake, alert         Data Reviewed: personally reviewed   CBC    Component Value Date/Time   WBC 3.7 (L) 12/23/2021 0033   RBC 2.59 (L) 12/23/2021 0033   HGB 8.3 (L) 12/23/2021 0033   HGB 9.7 (L) 07/12/2021 1118   HGB 12.3 (L) 07/12/2019 0900   HCT  24.3 (L) 12/23/2021 0033   HCT 34.6 (L) 07/12/2019 0900   PLT 95 (L) 12/23/2021 0033   PLT 118 (L) 07/12/2021 1118   PLT 90 (LL) 07/12/2019 0900   MCV 93.8 12/23/2021 0033   MCV 108 (H) 07/12/2019 0900   MCH 32.0 12/23/2021 0033   MCHC 34.2 12/23/2021 0033   RDW 14.4 12/23/2021 0033   RDW 14.8 07/12/2019 0900   LYMPHSABS 0.5 (L) 12/17/2021 1102   LYMPHSABS 0.6 (L) 12/28/2018 1350   MONOABS 0.5 12/17/2021 1102   EOSABS 0.2 12/17/2021 1102   EOSABS 0.1 12/28/2018 1350   BASOSABS 0.0 12/17/2021 1102   BASOSABS 0.0 12/28/2018 1350      Latest Ref Rng & Units 12/19/2021    3:05 AM 12/18/2021    4:04 AM 12/17/2021    11:02 AM  CMP  Glucose 70 - 99 mg/dL 96  180  83   BUN 6 - 20 mg/dL 19  16  14    Creatinine 0.61 - 1.24 mg/dL 0.95  1.25  1.19   Sodium 135 - 145 mmol/L 139  138  143   Potassium 3.5 - 5.1 mmol/L 3.7  3.9  3.7   Chloride 98 - 111 mmol/L 107  107  113   CO2 22 - 32 mmol/L 24  22  22    Calcium 8.9 - 10.3 mg/dL 8.7  8.7  9.3   Total Protein 6.5 - 8.1 g/dL   6.0   Total Bilirubin 0.3 - 1.2 mg/dL   1.8   Alkaline Phos 38 - 126 U/L   126   AST 15 - 41 U/L   30   ALT 0 - 44 U/L   19      Radiology Studies: No results found.   Scheduled Meds:  sodium chloride   Intravenous Once   acetaminophen  1,000 mg Oral TID   acitretin  25 mg Oral Daily   Chlorhexidine Gluconate Cloth  6 each Topical Daily   diclofenac Sodium  2 g Topical QID   docusate sodium  100 mg Oral BID   feeding supplement  237 mL Oral TID BM   fesoterodine  8 mg Oral Daily   folic acid  2 mg Oral Daily   insulin aspart  0-9 Units Subcutaneous TID WC   lactase  3,000 Units Oral BID   lamoTRIgine  150 mg Oral BID   latanoprost  1 drop Both Eyes QHS   lidocaine  1 patch Transdermal Q24H   mesalamine  2.4 g Oral Daily   methocarbamol  750 mg Oral TID   methscopolamine  5 mg Oral BID   multivitamin with minerals  1 tablet Oral Daily   mupirocin ointment  1 Application Topical TID   pantoprazole  40 mg Oral BID AC   potassium chloride SA  20 mEq Oral BID   rifaximin  550 mg Oral BID   sertraline  200 mg Oral Q breakfast   sodium chloride flush  10-40 mL Intracatheter Q12H   tamsulosin  0.4 mg Oral QHS   traZODone  150 mg Oral QHS   Continuous Infusions:     LOS: 6 days   Time spent: [place], DO [facility]   12/23/2021, 11:50 AM

## 2021-12-23 NOTE — Progress Notes (Signed)
  Mobility Specialist Criteria Algorithm Info.    12/23/21 1100  Mobility  Activity Ambulated with assistance in hallway  Range of Motion/Exercises Active;All extremities  Level of Assistance Contact guard assist, steadying assist  Assistive Device Front wheel walker  LLE Weight Bearing WBAT  Distance Ambulated (ft) 650 ft  Activity Response Tolerated well (cues- for hand placement, proximal to RW, environment navigation)   Patient received in supine eager to participate in mobility. Progressing well with mobility as he was able to tolerate WBAT and extend distance. Was independent to EOB but required extra time to do so. Needed min A sit>stand + cues for foot/hand placement. Ambulated in hallway min guard with slow gait needing occasional cues to not overstep and for environmental navigation. Required seated rest recovery x1 second to fatigue. Returned to room without complaint or incident. Was left with all needs met, call bell in reach.   12/23/2021 11:00 AM  [place] Rheya [place], [place], [place]  JQZES:[phone] Office: [phone]

## 2021-12-24 ENCOUNTER — Other Ambulatory Visit (HOSPITAL_COMMUNITY): Payer: Self-pay

## 2021-12-24 DIAGNOSIS — S72145A Nondisplaced intertrochanteric fracture of left femur, initial encounter for closed fracture: Secondary | ICD-10-CM | POA: Diagnosis not present

## 2021-12-24 LAB — GLUCOSE, CAPILLARY
Glucose-Capillary: 86 mg/dL (ref 70–99)
Glucose-Capillary: 119 mg/dL — ABNORMAL HIGH (ref 70–99)

## 2021-12-24 LAB — CREATININE, SERUM
Creatinine, Ser: 0.9 mg/dL (ref 0.61–1.24)
GFR, Estimated: >60 mL/min (ref >60)

## 2021-12-24 MED ORDER — OXYCODONE HCL 10 MG PO TABS
10.0000 mg | ORAL_TABLET | ORAL | 0 refills | Status: DC | PRN
Start: 2021-12-24 — End: 2022-03-21
  Filled 2021-12-24: qty 30, 4d supply, fill #0

## 2021-12-24 MED ORDER — DICLOFENAC SODIUM 1 % EX GEL
2.0000 g | Freq: Four times a day (QID) | CUTANEOUS | 0 refills | Status: DC
  Filled 2021-12-24: qty 100, 15d supply, fill #0

## 2021-12-24 MED ORDER — APIXABAN 2.5 MG PO TABS
2.5000 mg | ORAL_TABLET | Freq: Two times a day (BID) | ORAL | 0 refills | Status: DC
  Filled 2021-12-24: qty 20, 10d supply, fill #0

## 2021-12-24 MED ORDER — METHOCARBAMOL 750 MG PO TABS
750.0000 mg | ORAL_TABLET | Freq: Three times a day (TID) | ORAL | 0 refills | Status: DC | PRN
  Filled 2021-12-24: qty 30, 10d supply, fill #0

## 2021-12-24 MED ORDER — APIXABAN 2.5 MG PO TABS
2.5000 mg | ORAL_TABLET | Freq: Two times a day (BID) | ORAL | Status: DC
  Administered 2021-12-24: 2.5 mg via ORAL
  Filled 2021-12-24: qty 1

## 2021-12-24 NOTE — TOC Progression Note (Signed)
Transition of Care [facility]) - Progression Note    Patient Details  Name: [name] [name] MRN: [id number] Date of Birth: 1961/05/16  [facility] of [facility] CM/SW Contact  Joanne Chars, LCSW Phone Number: 12/24/2021, 12:18 PM  Clinical Narrative:   CSW spoke with pt wife [name] [name].  She has been on the phone for an hour with insurance but has not been able to get any information as to whether things are resolved.  Discussed possibility of self pay for SNF vs HH, discussed pt walked 650 feet with mobility this weekend.  [name] [name] would like to move forward with plan for [facility] [place] notified and will confirm if insurance issue will affect HH or not.  1200: CSW spoke with pt wife [name] [name], she did speak with HH rep and is good to move forward with DC.  She would like pt to have second PT session if possible as HH will not be out for several days.       Expected Discharge Plan: [place] Barriers to Discharge: Continued Medical Work up, SNF Pending bed offer  Expected Discharge Plan and Services Expected Discharge Plan: [place] Choice: [place] arrangements for the past 2 months: Single Family Home Expected Discharge Date: 12/24/21                                     Social Determinants of Health (SDOH) Interventions    Readmission Risk Interventions     No data to display

## 2021-12-24 NOTE — Progress Notes (Signed)
Physical Therapy Treatment Patient Details Name: [name] [name] MRN: [id number] DOB: August 29, 1960 Today's Date: 12/24/2021   History of Present Illness Pt is a 61 y/o male who presents to acute care PT s/p IM nailing to L hip after fall. Pt also with likely left 6th anterior rib fracture. Pt has PMH of seizure disorder, Crohn's, anxiety and depression, DM, liver cirrhosis, GERD, and HTN.    PT Comments    Pt seen again for therapy session today as requested by CM prior to discharge. Pt instructed in and performed stair/step training. Pt also provided with gait belt. Therapist educated pt on vehicle transfers and pt verbalized understanding.   Recommendations for follow up therapy are one component of a multi-disciplinary discharge planning process, led by the attending physician.  Recommendations may be updated based on patient status, additional functional criteria and insurance authorization.  Follow Up Recommendations  Home health PT (with assist from wife) Can patient physically be transported by private vehicle: Yes   Assistance Recommended at Discharge PRN  Patient can return home with the following A little help with walking and/or transfers;A little help with bathing/dressing/bathroom;Assist for transportation;Help with stairs or ramp for entrance;Assistance with cooking/housework   Equipment Recommendations       Recommendations for Other Services       Precautions / Restrictions Precautions Precautions: Fall Restrictions Weight Bearing Restrictions: Yes LLE Weight Bearing: Weight bearing as tolerated     Mobility  Bed Mobility        General bed mobility comments: Pt up in chair.    Transfers Overall transfer level: Needs assistance Equipment used: Rolling walker (2 wheels) Transfers: Sit to/from Stand Sit to Stand: Min guard                Ambulation/Gait Ambulation/Gait assistance: Min guard Gait Distance (Feet): 40 Feet Assistive device: Rolling  walker (2 wheels) Gait Pattern/deviations: Decreased stance time - left, Step-to pattern, Antalgic, Decreased step length - right, Decreased step length - left, Step-through pattern   Gait velocity interpretation: 1.31 - 2.62 ft/sec, indicative of limited community ambulator   General Gait Details: Pt ambulated to gym from room and back for stair training.   Stairs Stairs: Yes Stairs assistance: Min guard Stair Management: One rail Left, Sideways, Step to pattern Number of Stairs: 3 General stair comments: Pt ascended and descended 3 steps respectively with use of rail on L when ascending and R when descending to be specific to his home environment. Pt limited by L LE pain but able to complete safely.   Wheelchair Mobility    Modified Rankin (Stroke Patients Only)       Balance Overall balance assessment: Needs assistance   Sitting balance-Leahy Scale: Fair     Standing balance support: Bilateral upper extremity supported, Single extremity supported Standing balance-Leahy Scale: Poor Standing balance comment: Pt able to briefly utilize only one UE during stair training                            Cognition Arousal/Alertness: Awake/alert Behavior During Therapy: WFL for tasks assessed/performed Overall Cognitive Status: History of cognitive impairments - at baseline Area of Impairment: Memory, Awareness, Orientation                 Orientation Level: Place   Memory: Decreased recall of precautions, Decreased short-term memory     Awareness: Emergent   General Comments: h/o memory issues; pt thinks he is at [facility]           General Comments        Pertinent Vitals/Pain Pain Assessment Pain Assessment: 0-10 Pain Score:  (did not quantify) Pain Location: left hip and left ribs Pain Descriptors / Indicators: Guarding Pain Intervention(s): Limited activity within patient's tolerance, Monitored during session    Home Living                           Prior Function            PT Goals (current goals can now be found in the care plan section) Acute Rehab PT Goals PT Goal Formulation: With patient Time For Goal Achievement: 12/25/21 Potential to Achieve Goals: Good Progress towards PT goals: Progressing toward goals    Frequency    Min 3X/week      PT Plan Current plan remains appropriate    Co-evaluation              AM-PAC PT "6 Clicks" Mobility   Outcome Measure  Help needed turning from your back to your side while in a flat bed without using bedrails?: A Little Help needed moving from lying on your back to sitting on the side of a flat bed without using bedrails?: A Little Help needed moving to and from a bed to a chair (including a wheelchair)?: A Little Help needed standing up from a chair using your arms (e.g., wheelchair or bedside chair)?: A Little Help needed to walk in hospital room?: A Little Help needed climbing 3-5 steps with a railing? : A Little 6 Click Score: 18    End of Session Equipment Utilized During Treatment: Gait belt Activity Tolerance: Patient limited by pain Patient left: in chair;with call bell/phone within reach;with chair alarm set;with nursing/sitter in room Nurse Communication: Mobility status PT Visit Diagnosis: Unsteadiness on feet (R26.81);History of falling (Z91.81);Pain;Muscle weakness (generalized) (M62.81) Pain - Right/Left: Left Pain - part of body: Hip     Time: 9038-3338 PT Time Calculation (min) (ACUTE ONLY): 10 min  Charges:  $Gait Training: 8-22 mins $Therapeutic Exercise: 8-22 mins $Therapeutic Activity: 8-22 mins                     Donna Bernard, PT    [facility] 12/24/2021, 1:30 PM

## 2021-12-24 NOTE — Discharge Summary (Addendum)
Physician Discharge Summary  [name] [name] JQG:[id number] DOB: [birth year] DOA: 12/17/2021  PCP: [name], MD  Admit date: 12/17/2021 Discharge date: 12/24/2021  Admitted From: home Discharge disposition: home   Recommendations for Outpatient Follow-Up:   Cbc, bmp 1 week Eliquis 2.5 mg BID DEXA scan when able Discharge held from 6/30 due to insurance issues Home health Continue incentive spirometry   Discharge Diagnosis:   Principal Problem:   Closed nondisplaced intertrochanteric fracture of left femur, initial encounter ([place]) Active Problems:   Iron deficiency anemia due to chronic blood loss and thrombocytopenia    Rib fracture   DM2 (diabetes mellitus, type 2) (HCC)   Liver cirrhosis secondary to NASH with liver mass    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  Diet recommendation: DYS 3  Wound care: None.  Code status: Full.   History of Present Illness:   [name] [name] is a 61 y.o. male with medical history significant of seizure disorder, chron's, T2DM, depression and anxiety, GERD with hx of esophageal stricture, HTN, HLD, liver cirrhosis secondary to NASH, OSA on cpap, keratinizing SCC of the left cheek currently on neoadjuvant cemiplimab. Surgical resection on 09/18/21. History of SCC secondary to treatment for his chron's disease who presented to ED s/p fall this morning while out for a walk.    He was out walking with his walking sticks and started to get unstable/dizzy which is common for him and he tried to get to the grass and must have tripped over curb. Fell onto his left side with immediate pain over lateral left side and left hip.  Had immediate  pain, unable to bear weight. Was able to call wife for help. Has sensation and movement in foot, but states sensation is decreasing. History of frequent falls. On no blood thinners.    Denies any fever/chills, vision changes/headaches, chest pain or palpitations, shortness of breath or cough, +abdominal pain, N/V/D, +dysuria, no leg swelling.    He does not smoke or drink.    Hospital Course by Problem:   Left hip fracture -Status post IM nail left hip 6/26 Dr. Marcelino Scot -pain control -incentive spirometry  -no DVT -eliquis x 14 days per orth   Prior diagnosis of squamous cell CA right cheek + XRT status post Libtayo  Interval surveillance at this time-consideration as an outpatient to continue Libtayo versus not given underlying Crohn's disease which can worsen from this medication  follow-up with Dr. Salley Scarlet of dermatology and ENT Dr. Barton Dubois--- continue [name] 25 daily Seems to be maintained on dysphagia 3 diet   Anemia of expected blood loss from surgery -transfused x 1 and hgb stable -outpatient follow up  Rib fracture -incentive spirometry -lidocaine patch   AKI secondary to perioperative state             -hold IVF             -Cr improved   History of complex partial seizures Continue Lamictal 150 twice daily   DM TY 2 -SSI   Crohn's disease followed by Dr. Fuller Plan Continue Lialda 2.4 daily, methscopolamine 5 mg twice daily Continue lactase 3000 twice daily Continue Protonix 40  twice daily   BPH Continue Detrol LA 8 daily, Flomax 0.4   Liver cirrhosis with mass No ascites AFP 38 Undergoing work-up and was to be seen 12/12/2021 for liver directed therapies Outpatient follow-up with oncology Continue rifampin 550 twice daily for presumed prevention of hepatic encephalopathy   Depression Follow-up as an outpatient with Dr. Darleene Cleaver continue trazodone 150 at bedtime sertraline 1 200 every morning    Medical Consultants:   ortho   Discharge Exam:    Vitals:   12/23/21 2307 12/24/21 0734  BP: (!) 141/74 108/63  Pulse: 73 72  Resp: 16 18  Temp: 98.6 F (37 C) 98.5 F (36.9 C)  SpO2: 100% 98%   Vitals:   12/23/21 0723 12/23/21 1415 12/23/21 2307 12/24/21 0734  BP: 120/73 114/66 (!) 141/74 108/63  Pulse: 62 84 73 72  Resp: 17 17 16 18   Temp: 98.2 F (36.8 C) 98.3 F (36.8 C) 98.6 F (37 C) 98.5 F (36.9 C)  TempSrc: Oral Oral Oral Oral  SpO2: 97% 98% 100% 98%  Weight:      Height:        General exam: Appears calm and comfortable.   The results of significant diagnostics from this hospitalization (including imaging, microbiology, ancillary and laboratory) are listed below for reference.     Procedures and Diagnostic Studies:   VAS [place] LOWER EXTREMITY VENOUS (DVT)  Result Date: 12/18/2021  Lower Venous DVT Study Patient Name:  [name] [name]  Date of Exam:   12/18/2021 Medical Rec #: [id number]      Accession #:    [id number] Date of Birth: October 17, 1960      Patient Gender: M Patient Age:   48 years Exam Location:  [facility] Procedure:      VAS [place] LOWER EXTREMITY VENOUS (DVT) Referring Phys: Ainsley Spinner --------------------------------------------------------------------------------  Indications: Pain, and Edema.  Risk Factors: Left hip fracture. Comparison Study: No prior study Performing Technologist: Maudry Mayhew MHA, RDMS, RVT, RDCS  Examination Guidelines: A complete evaluation includes B-mode imaging, spectral Doppler, color Doppler, and power Doppler as needed of all accessible portions of each vessel. Bilateral testing is considered an integral part of a complete examination. Limited examinations for reoccurring indications may be performed as noted. The reflux portion of the exam is performed with the patient in reverse Trendelenburg.  +-----+---------------+---------+-----------+----------+--------------+ RIGHTCompressibilityPhasicitySpontaneityPropertiesThrombus Aging  +-----+---------------+---------+-----------+----------+--------------+ CFV  Full           Yes      Yes                                 +-----+---------------+---------+-----------+----------+--------------+   +---------+---------------+---------+-----------+----------+--------------+ LEFT     CompressibilityPhasicitySpontaneityPropertiesThrombus Aging +---------+---------------+---------+-----------+----------+--------------+ CFV      Full           Yes      Yes                                 +---------+---------------+---------+-----------+----------+--------------+ SFJ      Full                                                        +---------+---------------+---------+-----------+----------+--------------+ FV Prox  Full                                                        +---------+---------------+---------+-----------+----------+--------------+  FV Mid   Full                                                        +---------+---------------+---------+-----------+----------+--------------+ FV DistalFull                                                        +---------+---------------+---------+-----------+----------+--------------+ PFV      Full                                                        +---------+---------------+---------+-----------+----------+--------------+ POP      Full           Yes      Yes                                 +---------+---------------+---------+-----------+----------+--------------+ PTV      Full                                                        +---------+---------------+---------+-----------+----------+--------------+   Left Technical Findings: Not visualized segments include peroneal veins.   Summary: RIGHT: - No evidence of common femoral vein obstruction.  LEFT: - There is no evidence of deep vein thrombosis in the lower extremity. However, portions of this examination were limited- see  technologist comments above.  - No cystic structure found in the popliteal fossa.  *See table(s) above for measurements and observations. Electronically signed by Harold Barban MD on 12/18/2021 at 10:35:32 PM.    Final    DG Hip Port Unilat With Pelvis 1V Left  Result Date: 12/17/2021 CLINICAL DATA:  Fall.  Postoperative left hip arthroplasty EXAM: DG HIP (WITH OR WITHOUT PELVIS) 1V PORT LEFT COMPARISON:  Same-day intraoperative fluoroscopic images. FINDINGS: Patient is status post ORIF of the proximal left femur with short intramedullary rod, distal interlocking screw, and proximal lag screw. Fracture alignment is near anatomic. No new fracture identified. Hardware intact. Degenerative changes of both hips. No dislocation. Expected postoperative changes within the soft tissues lateral to the left hip. IMPRESSION: Status post ORIF of the proximal left femur without apparent postoperative complication. Electronically Signed   By: Davina Poke D.O.   On: 12/17/2021 19:28   DG FEMUR MIN 2 VIEWS LEFT  Result Date: 12/17/2021 CLINICAL DATA:  Left hip ORIF, intraoperative examination EXAM: LEFT FEMUR 2 VIEWS COMPARISON:  12/17/2021 FINDINGS: Left hip ORIF utilizing a gamma nail has been performed with fracture fragments in near anatomic alignment on this limited examination. No unexpected fracture or dislocation. Moderate left hip degenerative arthritis is incidentally noted. Surgical skin staples are noted. Fluoroscopy time: 51 seconds Fluoroscopic images: 4 Fluoroscopic dose: 8.0 mGy IMPRESSION: Status post left hip ORIF. Electronically Signed   By: Linwood Dibbles.D.  On: 12/17/2021 19:14   DG C-Arm 1-60 Min-No Report  Result Date: 12/17/2021 Fluoroscopy was utilized by the requesting physician.  No radiographic interpretation.   CT Cervical Spine Wo Contrast  Result Date: 12/17/2021 CLINICAL DATA:  Trip and fall on a cement curb. EXAM: CT HEAD WITHOUT CONTRAST CT MAXILLOFACIAL WITHOUT CONTRAST CT  CERVICAL SPINE WITHOUT CONTRAST TECHNIQUE: Multidetector CT imaging of the head, cervical spine, and maxillofacial structures were performed using the standard protocol without intravenous contrast. Multiplanar CT image reconstructions of the cervical spine and maxillofacial structures were also generated. RADIATION DOSE REDUCTION: This exam was performed according to the departmental dose-optimization program which includes automated exposure control, adjustment of the mA and/or kV according to patient size and/or use of iterative reconstruction technique. COMPARISON:  CT head dated January 14, 2021 FINDINGS: CT HEAD FINDINGS Brain: No evidence of acute infarction, hemorrhage, hydrocephalus, extra-axial collection or mass lesion/mass effect. Mild cerebral volume loss. Vascular: No hyperdense vessel or unexpected calcification. Skull: Normal. Negative for fracture or focal lesion. Other: None CT MAXILLOFACIAL FINDINGS Osseous: No fracture or mandibular dislocation. No destructive process. Orbits: Negative. No traumatic or inflammatory finding. Bilateral cataract surgery. Sinuses: Clear. Soft tissues: Negative. CT CERVICAL SPINE FINDINGS Alignment: Mild anterolisthesis of C6. Skull base and vertebrae: No acute fracture. No primary bone lesion or focal pathologic process. Soft tissues and spinal canal: No prevertebral fluid or swelling. No visible canal hematoma. Disc levels: Multilevel degenerate disc disease with mild spinal canal stenosis and mild bilateral neural foraminal stenosis at C4-C5. Prominent bulky anterior osteophytes. Upper chest: Negative. Other: None IMPRESSION: 1.  No CT evidence of acute intracranial abnormality. 2.  Mild cerebral volume loss. 3.  No acute cervical spine fracture or traumatic subluxation. 4. Moderate multilevel degenerate disc disease of the cervical spine with prominent anterior osteophytes. Mild spinal canal and neural foraminal stenosis at C4-C5. 5.  No maxillofacial fracture or  significant soft tissue injury. Electronically Signed   By: Keane Police D.O.   On: 12/17/2021 12:33   CT Head Wo Contrast  Result Date: 12/17/2021 CLINICAL DATA:  Trip and fall on a cement curb. EXAM: CT HEAD WITHOUT CONTRAST CT MAXILLOFACIAL WITHOUT CONTRAST CT CERVICAL SPINE WITHOUT CONTRAST TECHNIQUE: Multidetector CT imaging of the head, cervical spine, and maxillofacial structures were performed using the standard protocol without intravenous contrast. Multiplanar CT image reconstructions of the cervical spine and maxillofacial structures were also generated. RADIATION DOSE REDUCTION: This exam was performed according to the departmental dose-optimization program which includes automated exposure control, adjustment of the mA and/or kV according to patient size and/or use of iterative reconstruction technique. COMPARISON:  CT head dated January 14, 2021 FINDINGS: CT HEAD FINDINGS Brain: No evidence of acute infarction, hemorrhage, hydrocephalus, extra-axial collection or mass lesion/mass effect. Mild cerebral volume loss. Vascular: No hyperdense vessel or unexpected calcification. Skull: Normal. Negative for fracture or focal lesion. Other: None CT MAXILLOFACIAL FINDINGS Osseous: No fracture or mandibular dislocation. No destructive process. Orbits: Negative. No traumatic or inflammatory finding. Bilateral cataract surgery. Sinuses: Clear. Soft tissues: Negative. CT CERVICAL SPINE FINDINGS Alignment: Mild anterolisthesis of C6. Skull base and vertebrae: No acute fracture. No primary bone lesion or focal pathologic process. Soft tissues and spinal canal: No prevertebral fluid or swelling. No visible canal hematoma. Disc levels: Multilevel degenerate disc disease with mild spinal canal stenosis and mild bilateral neural foraminal stenosis at C4-C5. Prominent bulky anterior osteophytes. Upper chest: Negative. Other: None IMPRESSION: 1.  No CT evidence of acute intracranial abnormality. 2.  Mild  cerebral volume  loss. 3.  No acute cervical spine fracture or traumatic subluxation. 4. Moderate multilevel degenerate disc disease of the cervical spine with prominent anterior osteophytes. Mild spinal canal and neural foraminal stenosis at C4-C5. 5.  No maxillofacial fracture or significant soft tissue injury. Electronically Signed   By: Keane Police D.O.   On: 12/17/2021 12:33   CT Maxillofacial Wo Contrast  Result Date: 12/17/2021 CLINICAL DATA:  Trip and fall on a cement curb. EXAM: CT HEAD WITHOUT CONTRAST CT MAXILLOFACIAL WITHOUT CONTRAST CT CERVICAL SPINE WITHOUT CONTRAST TECHNIQUE: Multidetector CT imaging of the head, cervical spine, and maxillofacial structures were performed using the standard protocol without intravenous contrast. Multiplanar CT image reconstructions of the cervical spine and maxillofacial structures were also generated. RADIATION DOSE REDUCTION: This exam was performed according to the departmental dose-optimization program which includes automated exposure control, adjustment of the mA and/or kV according to patient size and/or use of iterative reconstruction technique. COMPARISON:  CT head dated January 14, 2021 FINDINGS: CT HEAD FINDINGS Brain: No evidence of acute infarction, hemorrhage, hydrocephalus, extra-axial collection or mass lesion/mass effect. Mild cerebral volume loss. Vascular: No hyperdense vessel or unexpected calcification. Skull: Normal. Negative for fracture or focal lesion. Other: None CT MAXILLOFACIAL FINDINGS Osseous: No fracture or mandibular dislocation. No destructive process. Orbits: Negative. No traumatic or inflammatory finding. Bilateral cataract surgery. Sinuses: Clear. Soft tissues: Negative. CT CERVICAL SPINE FINDINGS Alignment: Mild anterolisthesis of C6. Skull base and vertebrae: No acute fracture. No primary bone lesion or focal pathologic process. Soft tissues and spinal canal: No prevertebral fluid or swelling. No visible canal hematoma. Disc levels: Multilevel  degenerate disc disease with mild spinal canal stenosis and mild bilateral neural foraminal stenosis at C4-C5. Prominent bulky anterior osteophytes. Upper chest: Negative. Other: None IMPRESSION: 1.  No CT evidence of acute intracranial abnormality. 2.  Mild cerebral volume loss. 3.  No acute cervical spine fracture or traumatic subluxation. 4. Moderate multilevel degenerate disc disease of the cervical spine with prominent anterior osteophytes. Mild spinal canal and neural foraminal stenosis at C4-C5. 5.  No maxillofacial fracture or significant soft tissue injury. Electronically Signed   By: Keane Police D.O.   On: 12/17/2021 12:33   DG Hip Unilat W or Wo Pelvis 2-3 Views Left  Result Date: 12/17/2021 CLINICAL DATA:  [place]. Left hip pain. EXAM: DG HIP (WITH OR WITHOUT PELVIS) 2-3V LEFT COMPARISON:  None Available. FINDINGS: Mildly displaced intertrochanteric fracture of the left hip. The right hip is intact. The bony pelvis is intact. The pubic symphysis and SI joints appear normal. IMPRESSION: Mildly displaced intertrochanteric fracture of the left hip. Electronically Signed   By: Marijo Sanes M.D.   On: 12/17/2021 11:53   DG Ribs Unilateral W/Chest Left  Result Date: 12/17/2021 CLINICAL DATA:  [place]. Left rib pain. EXAM: LEFT RIBS AND CHEST - 3+ VIEW COMPARISON:  Chest x-ray 01/14/2021 FINDINGS: The cardiac silhouette, mediastinal and hilar contours are within normal limits. No infiltrates, effusions or pneumothorax. Remote healed right rib fractures are noted. Dedicated views of the left ribs are limited by overlying EKG wires. Suspect remote healed rib fractures and an acute nondisplaced sixth anterior rib fracture. IMPRESSION: 1. No acute cardiopulmonary findings. 2. Suspect nondisplaced left sixth anterior rib fracture. Electronically Signed   By: Marijo Sanes M.D.   On: 12/17/2021 11:52     Labs:   Basic Metabolic Panel: Recent Labs  Lab 12/18/21 0404 12/19/21 0305 12/24/21 0741  NA 138  139  --  K 3.9 3.7  --   CL 107 107  --   CO2 22 24  --   GLUCOSE 180* 96  --   BUN 16 19  --   CREATININE 1.25* 0.95 0.90  CALCIUM 8.7* 8.7*  --    GFR Estimated Creatinine Clearance: 88.9 mL/min (by C-G formula based on SCr of 0.9 mg/dL). Liver Function Tests: No results for input(s): "AST", "ALT", "ALKPHOS", "BILITOT", "PROT", "ALBUMIN" in the last 168 hours.  No results for input(s): "LIPASE", "AMYLASE" in the last 168 hours. No results for input(s): "AMMONIA" in the last 168 hours. Coagulation profile Recent Labs  Lab 12/17/21 1340  INR 1.3*    CBC: Recent Labs  Lab 12/18/21 0404 12/19/21 0305 12/19/21 1410 12/20/21 0548 12/20/21 1951 12/21/21 0142 12/23/21 0033  WBC 6.3 5.8  --  4.7  --  4.7 3.7*  HGB 8.8* 7.8* 8.1* 7.6* 8.6* 8.4* 8.3*  HCT 25.7* 23.1* 24.9* 22.2* 24.2* 25.3* 24.3*  MCV 95.2 95.9  --  93.7  --  93.4 93.8  PLT 71* 71*  --  69*  --  77* 95*   Cardiac Enzymes: No results for input(s): "CKTOTAL", "CKMB", "CKMBINDEX", "TROPONINI" in the last 168 hours. BNP: Invalid input(s): "POCBNP" CBG: Recent Labs  Lab 12/22/21 2013 12/23/21 0723 12/23/21 1253 12/23/21 1703 12/24/21 0738  GLUCAP 99 81 100* 80 86   D-Dimer No results for input(s): "DDIMER" in the last 72 hours. Hgb A1c No results for input(s): "HGBA1C" in the last 72 hours. Lipid Profile No results for input(s): "CHOL", "HDL", "LDLCALC", "TRIG", "CHOLHDL", "LDLDIRECT" in the last 72 hours. Thyroid function studies No results for input(s): "TSH", "T4TOTAL", "T3FREE", "THYROIDAB" in the last 72 hours.  Invalid input(s): "FREET3" Anemia work up No results for input(s): "VITAMINB12", "FOLATE", "FERRITIN", "TIBC", "IRON", "RETICCTPCT" in the last 72 hours. Microbiology Recent Results (from the past 240 hour(s))  Urine Culture     Status: Abnormal   Collection Time: 12/17/21  2:11 PM   Specimen: Urine, Clean Catch  Result Value Ref Range Status   Specimen Description URINE, CLEAN  CATCH  Final   Special Requests   Final    NONE Performed at [facility], [address], [place], [place] [address]    Culture MULTIPLE SPECIES PRESENT, SUGGEST RECOLLECTION (A)  Final   Report Status 12/18/2021 FINAL  Final     Discharge Instructions:   Discharge Instructions     Discharge instructions   Complete by: As directed    DYS 3 diet Cbc/bmp 1 week   Increase activity slowly   Complete by: As directed    No wound care   Complete by: As directed       Allergies as of 12/24/2021       Reactions   Sulfonamide Derivatives Hives   Z-pak [azithromycin] Swelling   Dilaudid [hydromorphone Hcl] Nausea And Vomiting   Sulfa Antibiotics Hives, Rash        Medication List     TAKE these medications    acetaminophen 500 MG tablet Commonly known as: TYLENOL Take 2 tablets (1,000 mg total) by mouth 3 (three) times daily. What changed:  when to take this reasons to take this   acitretin 25 MG capsule Commonly known as: [name] Take 25 mg by mouth daily.   apixaban 2.5 MG Tabs tablet Commonly known as: ELIQUIS Take 1 tablet (2.5 mg total) by mouth 2 (two) times daily.   CALCIUM PO Take 1 tablet by mouth  daily.   cycloSPORINE 0.05 % ophthalmic emulsion Commonly known as: RESTASIS Place 1 drop into both eyes 2 (two) times daily as needed (dry eyes).   DAIRY RELIEF PO Take 1 tablet by mouth in the morning and at bedtime.   desonide 0.05 % ointment Commonly known as: DESOWEN Apply 1 Application topically 3 (three) times daily. Mix with Mupirocin 2%. To lower lip.   diclofenac Sodium 1 % Gel Commonly known as: VOLTAREN Apply 2 g topically 4 (four) times daily.   docusate sodium 100 MG capsule Commonly known as: COLACE Take 1 capsule (100 mg total) by mouth 2 (two) times daily.   esomeprazole 40 MG capsule Commonly known as: NEXIUM Take 1 capsule (40 mg total) by mouth 2 (two) times daily before a meal.   Eszopiclone 3 MG Tabs Take 3 mg by  mouth at bedtime as needed (sleep).   feeding supplement Liqd Take 237 mLs by mouth 2 (two) times daily between meals.   folic acid 1 MG tablet Commonly known as: FOLVITE Take 2 tablets (2 mg total) by mouth daily.   hydrocortisone 2.5 % cream Apply 1 Application topically 2 (two) times daily as needed (Itchy skin).   lamoTRIgine 150 MG tablet Commonly known as: LaMICtal Take 1 tablet (150 mg total) by mouth 2 (two) times daily.   latanoprost 0.005 % ophthalmic solution Commonly known as: XALATAN Place 1 drop into both eyes at bedtime.   Libtayo 350 MG/7ML Soln injection Generic drug: cemiplimab-rwlc Inject into the vein every 6 (six) weeks.   lidocaine 5 % Commonly known as: LIDODERM Place 1 patch onto the skin daily. Remove & Discard patch within 12 hours or as directed by MD   mesalamine 1.2 g EC tablet Commonly known as: LIALDA Take 2 tablets (2.4 g total) by mouth 2 (two) times daily.   methocarbamol 750 MG tablet Commonly known as: ROBAXIN Take 1 tablet (750 mg total) by mouth every 8 (eight) hours as needed for muscle spasms.   methscopolamine 5 MG tablet Commonly known as: PAMINE FORTE Take 1 tablet (5 mg total) by mouth 2 (two) times daily.   mupirocin ointment 2 % Commonly known as: BACTROBAN Apply 1 Application topically 3 (three) times daily. Mix with Desonide 0.05%. To lower lip.   Oxycodone HCl 10 MG Tabs Take 1-1.5 tablets (10-15 mg total) by mouth every 4 (four) hours as needed.   potassium chloride SA 20 MEQ tablet Commonly known as: KLOR-CON M Take 1 tablet (20 mEq total) by mouth 2 (two) times daily.   PROBIOTIC PO Take 1 tablet at bedtime by mouth.   rifaximin 550 MG Tabs tablet Commonly known as: Xifaxan Take 1 tablet (550 mg total) by mouth 2 (two) times daily.   sertraline 100 MG tablet Commonly known as: ZOLOFT Take 200 mg daily with breakfast by mouth.   tamsulosin 0.4 MG Caps capsule Commonly known as: FLOMAX Take 0.4 mg by  mouth at bedtime.   tolterodine 4 MG 24 hr capsule Commonly known as: DETROL LA Take 4 mg by mouth daily.   traZODone 150 MG tablet Commonly known as: DESYREL Take 150 mg by mouth at bedtime.   VITAMIN D-3 PO Take 1 capsule by mouth daily.        Follow-up Information     Alt[place], MD. Schedule an appointment as soon as possible for a visit in 2 week(s).   Specialty: Orthopedic Surgery Contact information: [place] [address] [phone]  [facility] Follow up.   Why: someone from the office will call to arrange home health visits. anticipate start of care on Thursday. If you have questions, call Malachy Mood at [facility] for assistance. Contact information: [place] [address] [phone]                  Time coordinating discharge: 45 min  Signed:  Geradine Girt DO  [facility] 12/24/2021, 11:14 AM

## 2021-12-24 NOTE — TOC Transition Note (Signed)
Transition of Care [facility]) - CM/SW Discharge Note   Patient Details  Name: [name] [name] MRN: [id number] Date of Birth: 1961-04-06  Transition of [facility]) CM/SW Contact:  Bartholomew Crews, RN Phone Number: [phone] 12/24/2021, 12:41 PM   Clinical Narrative:     Notified by CSW that patient's disposition was to return home with home health. Spoke with Malachy Mood at [employer] - discussed claim situation against patient's secondary [facility] commercial plan. Referral accepted by [facility] with start of care for Thursday. Stewart orders in place per MD. Patient to transition home via ambulance following afternoon PT session for stair and vehicle training. No further TOC needs identified at this time.   Final next level of care: [place] Barriers to Discharge: No Barriers Identified   Patient Goals and CMS Choice Patient states their goals for this hospitalization and ongoing recovery are:: return home with spouse CMS Medicare.gov Compare Post Acute Care list provided to:: Patient Represenative (must comment) (wife, [name]) Choice offered to / list presented to : Spouse  Discharge Placement                       Discharge Plan and Services     Post Acute Care Choice: [place]          DME Arranged: N/A DME Agency: NA       HH Arranged: PT, OT, RN [place] Agency: [place] Date [place]: 12/24/21 Time HH Agency Contacted: 1100 Representative spoke with at [place]: Welcome Determinants of Health ([place]) Interventions     Readmission Risk Interventions     No data to display

## 2021-12-24 NOTE — Plan of Care (Signed)
  Problem: Coping: Goal: Ability to adjust to condition or change in health will improve Outcome: Progressing

## 2021-12-24 NOTE — Plan of Care (Signed)
  Problem: Education: Goal: Ability to describe self-care measures that may prevent or decrease complications (Diabetes Survival Skills Education) will improve Outcome: Adequate for Discharge Goal: Individualized Educational Video(s) Outcome: Adequate for Discharge     Discussed with patient and spouse (via phone) medications and schedule. Reviewed new medications including antibiotic and eliquis. Spouse is the care giver and administers all medications. She verbalized understanding of all discharge instructions.            Problem: Coping: Goal: Ability to adjust to condition or change in health will improve Outcome: Adequate for Discharge   Problem: Health Behavior/Discharge Planning: Goal: Ability to identify and utilize available resources and services will improve Outcome: Adequate for Discharge Goal: Ability to manage health-related needs will improve Outcome: Adequate for Discharge   Problem: Nutritional: Goal: Maintenance of adequate nutrition will improve Outcome: Adequate for Discharge Goal: Progress toward achieving an optimal weight will improve Outcome: Adequate for Discharge   Problem: Skin Integrity: Goal: Risk for impaired skin integrity will decrease Outcome: Adequate for Discharge   Problem: Education: Goal: Knowledge of General Education information will improve Description: Including pain rating scale, medication(s)/side effects and non-pharmacologic comfort measures Outcome: Adequate for Discharge   Problem: Health Behavior/Discharge Planning: Goal: Ability to manage health-related needs will improve Outcome: Adequate for Discharge   Problem: Coping: Goal: Level of anxiety will decrease Outcome: Adequate for Discharge   Problem: Elimination: Goal: Will not experience complications related to bowel motility Outcome: Adequate for Discharge Goal: Will not experience complications related to urinary retention Outcome: Adequate for Discharge    Problem: Pain Managment: Goal: General experience of comfort will improve Outcome: Adequate for Discharge   Problem: Safety: Goal: Ability to remain free from injury will improve Outcome: Adequate for Discharge   Problem: Skin Integrity: Goal: Risk for impaired skin integrity will decrease Outcome: Adequate for Discharge   Problem: Malnutrition  (NI-5.2) Goal: Food and/or nutrient delivery Description: Individualized approach for food/nutrient provision. Outcome: Adequate for Discharge

## 2021-12-24 NOTE — Progress Notes (Signed)
Physical Therapy Treatment Patient Details Name: [name] [name] MRN: [id number] DOB: 1961/03/19 Today's Date: 12/24/2021   History of Present Illness Pt is a 61 y/o male who presents to acute care PT s/p IM nailing to L hip after fall. Pt also with likely left 6th anterior rib fracture. Pt has PMH of seizure disorder, Crohn's, anxiety and depression, DM, liver cirrhosis, GERD, and HTN.    PT Comments    Pt instructed in and performed gait training and therapeutic exercises. Pt able to increase distance to 350 consecutive feet with RW although several standing rest breaks required. Anticipate pt to go home with home health PT if pt's wife can provide intermittent assistance and supervise pt.   Recommendations for follow up therapy are one component of a multi-disciplinary discharge planning process, led by the attending physician.  Recommendations may be updated based on patient status, additional functional criteria and insurance authorization.  Follow Up Recommendations  Skilled nursing-short term rehab (<3 hours/day) (home if pt's wife willing to care for pt) Can patient physically be transported by private vehicle: Yes   Assistance Recommended at Discharge PRN  Patient can return home with the following A little help with walking and/or transfers;A little help with bathing/dressing/bathroom;Assist for transportation;Help with stairs or ramp for entrance;Assistance with cooking/housework   Equipment Recommendations       Recommendations for Other Services       Precautions / Restrictions Precautions Precautions: Fall Restrictions Weight Bearing Restrictions: Yes LLE Weight Bearing: Weight bearing as tolerated     Mobility  Bed Mobility Overal bed mobility: Needs Assistance Bed Mobility: Supine to Sit     Supine to sit: Supervision, HOB elevated     General bed mobility comments: Pt required increased time 2/2 pain and weakness    Transfers Overall transfer level: Needs  assistance Equipment used: Rolling walker (2 wheels) Transfers: Sit to/from Stand Sit to Stand: Min guard                Ambulation/Gait Ambulation/Gait assistance: Min guard Gait Distance (Feet): 350 Feet Assistive device: Rolling walker (2 wheels) Gait Pattern/deviations: Decreased stance time - left, Step-to pattern, Antalgic, Decreased step length - right, Decreased step length - left, Step-through pattern   Gait velocity interpretation: 1.31 - 2.62 ft/sec, indicative of limited community ambulator   General Gait Details: Pt able to ambulate with reciprocal pattern mostly but sometimes step to pattern 2/2 pain. Pt required cues to avoid picking up RW to advance. Pt with slow pace.   Stairs             Wheelchair Mobility    Modified Rankin (Stroke Patients Only)       Balance Overall balance assessment: Needs assistance   Sitting balance-Leahy Scale: Fair     Standing balance support: Bilateral upper extremity supported Standing balance-Leahy Scale: Poor                              Cognition Arousal/Alertness: Awake/alert Behavior During Therapy: WFL for tasks assessed/performed Overall Cognitive Status: History of cognitive impairments - at baseline Area of Impairment: Memory, Awareness, Orientation                 Orientation Level: Place   Memory: Decreased recall of precautions, Decreased short-term memory     Awareness: Emergent   General Comments: h/o memory issues; pt thinks he is at [place] [facility]  Exercises General Exercises - Lower Extremity Heel Slides: Left, 10 reps, Supine Hip ABduction/ADduction: Left, 10 reps, Supine Straight Leg Raises: Left, Supine (8 reps)    General Comments        Pertinent Vitals/Pain Pain Assessment Pain Assessment: 0-10 Pain Score: 8  Pain Location: left hip and left ribs Pain Descriptors / Indicators: Guarding Pain Intervention(s): Limited activity within  patient's tolerance, Monitored during session, Premedicated before session, Ice applied    Home Living                          Prior Function            PT Goals (current goals can now be found in the care plan section) Acute Rehab PT Goals PT Goal Formulation: With patient Time For Goal Achievement: 12/25/21 Potential to Achieve Goals: Good Progress towards PT goals: Progressing toward goals    Frequency    Min 3X/week      PT Plan Current plan remains appropriate    Co-evaluation              AM-PAC PT "6 Clicks" Mobility   Outcome Measure  Help needed turning from your back to your side while in a flat bed without using bedrails?: A Little Help needed moving from lying on your back to sitting on the side of a flat bed without using bedrails?: A Little Help needed moving to and from a bed to a chair (including a wheelchair)?: A Little Help needed standing up from a chair using your arms (e.g., wheelchair or bedside chair)?: A Little Help needed to walk in hospital room?: A Little Help needed climbing 3-5 steps with a railing? : A Little 6 Click Score: 18    End of Session Equipment Utilized During Treatment: Gait belt Activity Tolerance: Patient limited by pain Patient left: in chair;with call bell/phone within reach;with chair alarm set Nurse Communication: Mobility status PT Visit Diagnosis: Unsteadiness on feet (R26.81);History of falling (Z91.81);Pain;Muscle weakness (generalized) (M62.81) Pain - Right/Left: Left Pain - part of body: Hip     Time: 0832-0902 PT Time Calculation (min) (ACUTE ONLY): 30 min  Charges:  $Gait Training: 8-22 mins $Therapeutic Exercise: 8-22 mins                     Donna Bernard, PT    [facility] 12/24/2021, 10:42 AM

## 2021-12-27 DIAGNOSIS — K7460 Unspecified cirrhosis of liver: Secondary | ICD-10-CM | POA: Diagnosis not present

## 2021-12-27 DIAGNOSIS — G40209 Localization-related (focal) (partial) symptomatic epilepsy and epileptic syndromes with complex partial seizures, not intractable, without status epilepticus: Secondary | ICD-10-CM | POA: Diagnosis not present

## 2021-12-27 DIAGNOSIS — K7581 Nonalcoholic steatohepatitis (NASH): Secondary | ICD-10-CM | POA: Diagnosis not present

## 2021-12-27 DIAGNOSIS — Z79891 Long term (current) use of opiate analgesic: Secondary | ICD-10-CM | POA: Diagnosis not present

## 2021-12-27 DIAGNOSIS — N400 Enlarged prostate without lower urinary tract symptoms: Secondary | ICD-10-CM | POA: Diagnosis not present

## 2021-12-27 DIAGNOSIS — G4733 Obstructive sleep apnea (adult) (pediatric): Secondary | ICD-10-CM | POA: Diagnosis not present

## 2021-12-27 DIAGNOSIS — E119 Type 2 diabetes mellitus without complications: Secondary | ICD-10-CM | POA: Diagnosis not present

## 2021-12-27 DIAGNOSIS — R296 Repeated falls: Secondary | ICD-10-CM | POA: Diagnosis not present

## 2021-12-27 DIAGNOSIS — E43 Unspecified severe protein-calorie malnutrition: Secondary | ICD-10-CM | POA: Diagnosis not present

## 2021-12-27 DIAGNOSIS — F32A Depression, unspecified: Secondary | ICD-10-CM | POA: Diagnosis not present

## 2021-12-27 DIAGNOSIS — M80052D Age-related osteoporosis with current pathological fracture, left femur, subsequent encounter for fracture with routine healing: Secondary | ICD-10-CM | POA: Diagnosis not present

## 2022-01-01 DIAGNOSIS — E43 Unspecified severe protein-calorie malnutrition: Secondary | ICD-10-CM | POA: Diagnosis not present

## 2022-01-01 DIAGNOSIS — K7460 Unspecified cirrhosis of liver: Secondary | ICD-10-CM | POA: Diagnosis not present

## 2022-01-01 DIAGNOSIS — R296 Repeated falls: Secondary | ICD-10-CM | POA: Diagnosis not present

## 2022-01-01 DIAGNOSIS — M80052D Age-related osteoporosis with current pathological fracture, left femur, subsequent encounter for fracture with routine healing: Secondary | ICD-10-CM | POA: Diagnosis not present

## 2022-01-01 DIAGNOSIS — E119 Type 2 diabetes mellitus without complications: Secondary | ICD-10-CM | POA: Diagnosis not present

## 2022-01-01 DIAGNOSIS — K7581 Nonalcoholic steatohepatitis (NASH): Secondary | ICD-10-CM | POA: Diagnosis not present

## 2022-01-02 DIAGNOSIS — S72145D Nondisplaced intertrochanteric fracture of left femur, subsequent encounter for closed fracture with routine healing: Secondary | ICD-10-CM | POA: Diagnosis not present

## 2022-01-03 ENCOUNTER — Other Ambulatory Visit (HOSPITAL_BASED_OUTPATIENT_CLINIC_OR_DEPARTMENT_OTHER): Payer: Self-pay

## 2022-01-03 DIAGNOSIS — K7581 Nonalcoholic steatohepatitis (NASH): Secondary | ICD-10-CM | POA: Diagnosis not present

## 2022-01-03 DIAGNOSIS — M80052D Age-related osteoporosis with current pathological fracture, left femur, subsequent encounter for fracture with routine healing: Secondary | ICD-10-CM | POA: Diagnosis not present

## 2022-01-03 DIAGNOSIS — K7460 Unspecified cirrhosis of liver: Secondary | ICD-10-CM | POA: Diagnosis not present

## 2022-01-03 DIAGNOSIS — E43 Unspecified severe protein-calorie malnutrition: Secondary | ICD-10-CM | POA: Diagnosis not present

## 2022-01-03 DIAGNOSIS — E119 Type 2 diabetes mellitus without complications: Secondary | ICD-10-CM | POA: Diagnosis not present

## 2022-01-03 DIAGNOSIS — R296 Repeated falls: Secondary | ICD-10-CM | POA: Diagnosis not present

## 2022-01-04 DIAGNOSIS — Z09 Encounter for follow-up examination after completed treatment for conditions other than malignant neoplasm: Secondary | ICD-10-CM | POA: Diagnosis not present

## 2022-01-04 DIAGNOSIS — R160 Hepatomegaly, not elsewhere classified: Secondary | ICD-10-CM | POA: Diagnosis not present

## 2022-01-04 DIAGNOSIS — K7460 Unspecified cirrhosis of liver: Secondary | ICD-10-CM | POA: Diagnosis not present

## 2022-01-04 DIAGNOSIS — R296 Repeated falls: Secondary | ICD-10-CM | POA: Diagnosis not present

## 2022-01-04 DIAGNOSIS — S72002A Fracture of unspecified part of neck of left femur, initial encounter for closed fracture: Secondary | ICD-10-CM | POA: Diagnosis not present

## 2022-01-04 DIAGNOSIS — D61818 Other pancytopenia: Secondary | ICD-10-CM | POA: Diagnosis not present

## 2022-01-07 ENCOUNTER — Encounter: Payer: Self-pay | Admitting: Neurology

## 2022-01-07 DIAGNOSIS — M80052D Age-related osteoporosis with current pathological fracture, left femur, subsequent encounter for fracture with routine healing: Secondary | ICD-10-CM | POA: Diagnosis not present

## 2022-01-07 DIAGNOSIS — R296 Repeated falls: Secondary | ICD-10-CM | POA: Diagnosis not present

## 2022-01-07 DIAGNOSIS — K7581 Nonalcoholic steatohepatitis (NASH): Secondary | ICD-10-CM | POA: Diagnosis not present

## 2022-01-07 DIAGNOSIS — K7460 Unspecified cirrhosis of liver: Secondary | ICD-10-CM | POA: Diagnosis not present

## 2022-01-07 DIAGNOSIS — E119 Type 2 diabetes mellitus without complications: Secondary | ICD-10-CM | POA: Diagnosis not present

## 2022-01-07 DIAGNOSIS — E43 Unspecified severe protein-calorie malnutrition: Secondary | ICD-10-CM | POA: Diagnosis not present

## 2022-01-08 DIAGNOSIS — C220 Liver cell carcinoma: Secondary | ICD-10-CM | POA: Diagnosis not present

## 2022-01-08 DIAGNOSIS — I10 Essential (primary) hypertension: Secondary | ICD-10-CM | POA: Diagnosis not present

## 2022-01-08 DIAGNOSIS — E119 Type 2 diabetes mellitus without complications: Secondary | ICD-10-CM | POA: Diagnosis not present

## 2022-01-08 DIAGNOSIS — G4733 Obstructive sleep apnea (adult) (pediatric): Secondary | ICD-10-CM | POA: Diagnosis not present

## 2022-01-08 DIAGNOSIS — K921 Melena: Secondary | ICD-10-CM | POA: Diagnosis not present

## 2022-01-08 DIAGNOSIS — K769 Liver disease, unspecified: Secondary | ICD-10-CM | POA: Diagnosis not present

## 2022-01-08 DIAGNOSIS — Z86010 Personal history of colonic polyps: Secondary | ICD-10-CM | POA: Diagnosis not present

## 2022-01-08 DIAGNOSIS — G40209 Localization-related (focal) (partial) symptomatic epilepsy and epileptic syndromes with complex partial seizures, not intractable, without status epilepticus: Secondary | ICD-10-CM | POA: Diagnosis not present

## 2022-01-08 DIAGNOSIS — K219 Gastro-esophageal reflux disease without esophagitis: Secondary | ICD-10-CM | POA: Diagnosis not present

## 2022-01-09 DIAGNOSIS — M80052D Age-related osteoporosis with current pathological fracture, left femur, subsequent encounter for fracture with routine healing: Secondary | ICD-10-CM | POA: Diagnosis not present

## 2022-01-09 DIAGNOSIS — E43 Unspecified severe protein-calorie malnutrition: Secondary | ICD-10-CM | POA: Diagnosis not present

## 2022-01-09 DIAGNOSIS — E119 Type 2 diabetes mellitus without complications: Secondary | ICD-10-CM | POA: Diagnosis not present

## 2022-01-09 DIAGNOSIS — K7460 Unspecified cirrhosis of liver: Secondary | ICD-10-CM | POA: Diagnosis not present

## 2022-01-09 DIAGNOSIS — R296 Repeated falls: Secondary | ICD-10-CM | POA: Diagnosis not present

## 2022-01-09 DIAGNOSIS — K7581 Nonalcoholic steatohepatitis (NASH): Secondary | ICD-10-CM | POA: Diagnosis not present

## 2022-01-10 DIAGNOSIS — E119 Type 2 diabetes mellitus without complications: Secondary | ICD-10-CM | POA: Diagnosis not present

## 2022-01-10 DIAGNOSIS — K7460 Unspecified cirrhosis of liver: Secondary | ICD-10-CM | POA: Diagnosis not present

## 2022-01-10 DIAGNOSIS — R296 Repeated falls: Secondary | ICD-10-CM | POA: Diagnosis not present

## 2022-01-10 DIAGNOSIS — K7581 Nonalcoholic steatohepatitis (NASH): Secondary | ICD-10-CM | POA: Diagnosis not present

## 2022-01-10 DIAGNOSIS — M80052D Age-related osteoporosis with current pathological fracture, left femur, subsequent encounter for fracture with routine healing: Secondary | ICD-10-CM | POA: Diagnosis not present

## 2022-01-10 DIAGNOSIS — E43 Unspecified severe protein-calorie malnutrition: Secondary | ICD-10-CM | POA: Diagnosis not present

## 2022-01-11 DIAGNOSIS — R296 Repeated falls: Secondary | ICD-10-CM | POA: Diagnosis not present

## 2022-01-11 DIAGNOSIS — E119 Type 2 diabetes mellitus without complications: Secondary | ICD-10-CM | POA: Diagnosis not present

## 2022-01-11 DIAGNOSIS — M80052D Age-related osteoporosis with current pathological fracture, left femur, subsequent encounter for fracture with routine healing: Secondary | ICD-10-CM | POA: Diagnosis not present

## 2022-01-11 DIAGNOSIS — E43 Unspecified severe protein-calorie malnutrition: Secondary | ICD-10-CM | POA: Diagnosis not present

## 2022-01-11 DIAGNOSIS — K7581 Nonalcoholic steatohepatitis (NASH): Secondary | ICD-10-CM | POA: Diagnosis not present

## 2022-01-11 DIAGNOSIS — K7460 Unspecified cirrhosis of liver: Secondary | ICD-10-CM | POA: Diagnosis not present

## 2022-01-14 ENCOUNTER — Ambulatory Visit: Payer: 59 | Admitting: Neurology

## 2022-01-14 DIAGNOSIS — E43 Unspecified severe protein-calorie malnutrition: Secondary | ICD-10-CM | POA: Diagnosis not present

## 2022-01-14 DIAGNOSIS — K7581 Nonalcoholic steatohepatitis (NASH): Secondary | ICD-10-CM | POA: Diagnosis not present

## 2022-01-14 DIAGNOSIS — E119 Type 2 diabetes mellitus without complications: Secondary | ICD-10-CM | POA: Diagnosis not present

## 2022-01-14 DIAGNOSIS — M80052D Age-related osteoporosis with current pathological fracture, left femur, subsequent encounter for fracture with routine healing: Secondary | ICD-10-CM | POA: Diagnosis not present

## 2022-01-14 DIAGNOSIS — K7460 Unspecified cirrhosis of liver: Secondary | ICD-10-CM | POA: Diagnosis not present

## 2022-01-14 DIAGNOSIS — R296 Repeated falls: Secondary | ICD-10-CM | POA: Diagnosis not present

## 2022-01-17 DIAGNOSIS — M80052D Age-related osteoporosis with current pathological fracture, left femur, subsequent encounter for fracture with routine healing: Secondary | ICD-10-CM | POA: Diagnosis not present

## 2022-01-17 DIAGNOSIS — E43 Unspecified severe protein-calorie malnutrition: Secondary | ICD-10-CM | POA: Diagnosis not present

## 2022-01-17 DIAGNOSIS — K7460 Unspecified cirrhosis of liver: Secondary | ICD-10-CM | POA: Diagnosis not present

## 2022-01-17 DIAGNOSIS — E119 Type 2 diabetes mellitus without complications: Secondary | ICD-10-CM | POA: Diagnosis not present

## 2022-01-17 DIAGNOSIS — R296 Repeated falls: Secondary | ICD-10-CM | POA: Diagnosis not present

## 2022-01-17 DIAGNOSIS — K7581 Nonalcoholic steatohepatitis (NASH): Secondary | ICD-10-CM | POA: Diagnosis not present

## 2022-01-18 DIAGNOSIS — K7581 Nonalcoholic steatohepatitis (NASH): Secondary | ICD-10-CM | POA: Diagnosis not present

## 2022-01-18 DIAGNOSIS — K7460 Unspecified cirrhosis of liver: Secondary | ICD-10-CM | POA: Diagnosis not present

## 2022-01-18 DIAGNOSIS — M80052D Age-related osteoporosis with current pathological fracture, left femur, subsequent encounter for fracture with routine healing: Secondary | ICD-10-CM | POA: Diagnosis not present

## 2022-01-18 DIAGNOSIS — E43 Unspecified severe protein-calorie malnutrition: Secondary | ICD-10-CM | POA: Diagnosis not present

## 2022-01-18 DIAGNOSIS — E119 Type 2 diabetes mellitus without complications: Secondary | ICD-10-CM | POA: Diagnosis not present

## 2022-01-18 DIAGNOSIS — R296 Repeated falls: Secondary | ICD-10-CM | POA: Diagnosis not present

## 2022-01-21 DIAGNOSIS — N130 Hydronephrosis with ureteropelvic junction obstruction: Secondary | ICD-10-CM | POA: Diagnosis not present

## 2022-01-21 DIAGNOSIS — N202 Calculus of kidney with calculus of ureter: Secondary | ICD-10-CM | POA: Diagnosis not present

## 2022-01-22 DIAGNOSIS — R296 Repeated falls: Secondary | ICD-10-CM | POA: Diagnosis not present

## 2022-01-22 DIAGNOSIS — F411 Generalized anxiety disorder: Secondary | ICD-10-CM | POA: Diagnosis not present

## 2022-01-22 DIAGNOSIS — C4492 Squamous cell carcinoma of skin, unspecified: Secondary | ICD-10-CM | POA: Diagnosis not present

## 2022-01-22 DIAGNOSIS — F332 Major depressive disorder, recurrent severe without psychotic features: Secondary | ICD-10-CM | POA: Diagnosis not present

## 2022-01-22 DIAGNOSIS — K7460 Unspecified cirrhosis of liver: Secondary | ICD-10-CM | POA: Diagnosis not present

## 2022-01-22 DIAGNOSIS — K7581 Nonalcoholic steatohepatitis (NASH): Secondary | ICD-10-CM | POA: Diagnosis not present

## 2022-01-22 DIAGNOSIS — E119 Type 2 diabetes mellitus without complications: Secondary | ICD-10-CM | POA: Diagnosis not present

## 2022-01-22 DIAGNOSIS — M80052D Age-related osteoporosis with current pathological fracture, left femur, subsequent encounter for fracture with routine healing: Secondary | ICD-10-CM | POA: Diagnosis not present

## 2022-01-22 DIAGNOSIS — E43 Unspecified severe protein-calorie malnutrition: Secondary | ICD-10-CM | POA: Diagnosis not present

## 2022-01-22 DIAGNOSIS — F4312 Post-traumatic stress disorder, chronic: Secondary | ICD-10-CM | POA: Diagnosis not present

## 2022-01-23 DIAGNOSIS — N1330 Unspecified hydronephrosis: Secondary | ICD-10-CM | POA: Diagnosis not present

## 2022-01-23 DIAGNOSIS — K760 Fatty (change of) liver, not elsewhere classified: Secondary | ICD-10-CM | POA: Diagnosis not present

## 2022-01-23 DIAGNOSIS — C4492 Squamous cell carcinoma of skin, unspecified: Secondary | ICD-10-CM | POA: Diagnosis not present

## 2022-01-23 DIAGNOSIS — K7460 Unspecified cirrhosis of liver: Secondary | ICD-10-CM | POA: Diagnosis not present

## 2022-01-23 DIAGNOSIS — K7689 Other specified diseases of liver: Secondary | ICD-10-CM | POA: Diagnosis not present

## 2022-01-23 DIAGNOSIS — C220 Liver cell carcinoma: Secondary | ICD-10-CM | POA: Diagnosis not present

## 2022-01-23 DIAGNOSIS — Z5112 Encounter for antineoplastic immunotherapy: Secondary | ICD-10-CM | POA: Diagnosis not present

## 2022-01-24 DIAGNOSIS — K769 Liver disease, unspecified: Secondary | ICD-10-CM | POA: Diagnosis not present

## 2022-01-24 DIAGNOSIS — K7460 Unspecified cirrhosis of liver: Secondary | ICD-10-CM | POA: Diagnosis not present

## 2022-01-24 DIAGNOSIS — C220 Liver cell carcinoma: Secondary | ICD-10-CM | POA: Diagnosis not present

## 2022-01-26 DIAGNOSIS — R296 Repeated falls: Secondary | ICD-10-CM | POA: Diagnosis not present

## 2022-01-26 DIAGNOSIS — E119 Type 2 diabetes mellitus without complications: Secondary | ICD-10-CM | POA: Diagnosis not present

## 2022-01-26 DIAGNOSIS — N400 Enlarged prostate without lower urinary tract symptoms: Secondary | ICD-10-CM | POA: Diagnosis not present

## 2022-01-26 DIAGNOSIS — G40209 Localization-related (focal) (partial) symptomatic epilepsy and epileptic syndromes with complex partial seizures, not intractable, without status epilepticus: Secondary | ICD-10-CM | POA: Diagnosis not present

## 2022-01-26 DIAGNOSIS — G4733 Obstructive sleep apnea (adult) (pediatric): Secondary | ICD-10-CM | POA: Diagnosis not present

## 2022-01-26 DIAGNOSIS — M80052D Age-related osteoporosis with current pathological fracture, left femur, subsequent encounter for fracture with routine healing: Secondary | ICD-10-CM | POA: Diagnosis not present

## 2022-01-26 DIAGNOSIS — F32A Depression, unspecified: Secondary | ICD-10-CM | POA: Diagnosis not present

## 2022-01-26 DIAGNOSIS — K7581 Nonalcoholic steatohepatitis (NASH): Secondary | ICD-10-CM | POA: Diagnosis not present

## 2022-01-26 DIAGNOSIS — Z79891 Long term (current) use of opiate analgesic: Secondary | ICD-10-CM | POA: Diagnosis not present

## 2022-01-26 DIAGNOSIS — K7460 Unspecified cirrhosis of liver: Secondary | ICD-10-CM | POA: Diagnosis not present

## 2022-01-26 DIAGNOSIS — E43 Unspecified severe protein-calorie malnutrition: Secondary | ICD-10-CM | POA: Diagnosis not present

## 2022-01-29 ENCOUNTER — Encounter (INDEPENDENT_AMBULATORY_CARE_PROVIDER_SITE_OTHER): Payer: 59 | Admitting: Ophthalmology

## 2022-01-30 ENCOUNTER — Encounter (INDEPENDENT_AMBULATORY_CARE_PROVIDER_SITE_OTHER): Payer: 59 | Admitting: Ophthalmology

## 2022-01-30 DIAGNOSIS — E119 Type 2 diabetes mellitus without complications: Secondary | ICD-10-CM | POA: Diagnosis not present

## 2022-01-30 DIAGNOSIS — E43 Unspecified severe protein-calorie malnutrition: Secondary | ICD-10-CM | POA: Diagnosis not present

## 2022-01-30 DIAGNOSIS — M80052D Age-related osteoporosis with current pathological fracture, left femur, subsequent encounter for fracture with routine healing: Secondary | ICD-10-CM | POA: Diagnosis not present

## 2022-01-30 DIAGNOSIS — S72145D Nondisplaced intertrochanteric fracture of left femur, subsequent encounter for closed fracture with routine healing: Secondary | ICD-10-CM | POA: Diagnosis not present

## 2022-01-30 DIAGNOSIS — K7581 Nonalcoholic steatohepatitis (NASH): Secondary | ICD-10-CM | POA: Diagnosis not present

## 2022-01-30 DIAGNOSIS — K7460 Unspecified cirrhosis of liver: Secondary | ICD-10-CM | POA: Diagnosis not present

## 2022-01-30 DIAGNOSIS — R296 Repeated falls: Secondary | ICD-10-CM | POA: Diagnosis not present

## 2022-01-31 DIAGNOSIS — E43 Unspecified severe protein-calorie malnutrition: Secondary | ICD-10-CM | POA: Diagnosis not present

## 2022-01-31 DIAGNOSIS — E119 Type 2 diabetes mellitus without complications: Secondary | ICD-10-CM | POA: Diagnosis not present

## 2022-01-31 DIAGNOSIS — K7460 Unspecified cirrhosis of liver: Secondary | ICD-10-CM | POA: Diagnosis not present

## 2022-01-31 DIAGNOSIS — K7581 Nonalcoholic steatohepatitis (NASH): Secondary | ICD-10-CM | POA: Diagnosis not present

## 2022-01-31 DIAGNOSIS — R296 Repeated falls: Secondary | ICD-10-CM | POA: Diagnosis not present

## 2022-01-31 DIAGNOSIS — M80052D Age-related osteoporosis with current pathological fracture, left femur, subsequent encounter for fracture with routine healing: Secondary | ICD-10-CM | POA: Diagnosis not present

## 2022-02-01 ENCOUNTER — Other Ambulatory Visit: Payer: Self-pay

## 2022-02-01 DIAGNOSIS — E119 Type 2 diabetes mellitus without complications: Secondary | ICD-10-CM | POA: Diagnosis not present

## 2022-02-01 DIAGNOSIS — R296 Repeated falls: Secondary | ICD-10-CM | POA: Diagnosis not present

## 2022-02-01 DIAGNOSIS — K7460 Unspecified cirrhosis of liver: Secondary | ICD-10-CM | POA: Diagnosis not present

## 2022-02-01 DIAGNOSIS — E43 Unspecified severe protein-calorie malnutrition: Secondary | ICD-10-CM | POA: Diagnosis not present

## 2022-02-01 DIAGNOSIS — M80052D Age-related osteoporosis with current pathological fracture, left femur, subsequent encounter for fracture with routine healing: Secondary | ICD-10-CM | POA: Diagnosis not present

## 2022-02-01 DIAGNOSIS — K7581 Nonalcoholic steatohepatitis (NASH): Secondary | ICD-10-CM | POA: Diagnosis not present

## 2022-02-01 NOTE — Patient Instructions (Signed)
Visit Information  Thank you for taking time to visit with me today. Please don't hesitate to contact me if I can be of assistance to you.   Following are the goals we discussed today:   Goals Addressed             This Visit's Progress    COMPLETED: Care Coordination Activities - no follow up required       Care Coordination Interventions: Provided education to patient re: Annual Wellness visit Reviewed scheduled/upcoming provider appointments including Dr. Orland Mustard 02/04/22 Reviewed care management services and to call if needs change            If you are experiencing a Mental Health or [place] or need someone to talk to, please call the Suicide and Crisis Lifeline: 988 call the [place] National Suicide Prevention Lifeline: 256-583-7430 or TTY: 828-798-1758 TTY 437 245 6995) to talk to a trained counselor call 1-800-273-TALK (toll free, 24 hour hotline) go to [facility] [address], [place] [phone]) call 911   Patient verbalizes understanding of instructions and care plan provided today and agrees to view in [place]. Active MyChart status and patient understanding of how to access instructions and care plan via MyChart confirmed with patient.     No further follow up required:    Peter Garter RN, Jackquline [place], [place] Management [phone]

## 2022-02-01 NOTE — Patient Outreach (Signed)
  Care Coordination   Initial Visit Note   02/01/2022 Name: ROSA GAMBALE MRN: [id number] DOB: 11/11/1960  SCOTTY WEIGELT is a 61 y.o. year old male who sees [name], MD for primary care. I spoke with  Sharon Seller by phone today  What matters to the patients health and wellness today?  Having liver cancer treatments at [facility] but states he has no care coordination needs at this time    Goals Addressed             This Visit's Progress    COMPLETED: Care Coordination Activities - no follow up required       Care Coordination Interventions: Provided education to patient re: Annual Wellness visit Reviewed scheduled/upcoming provider appointments including Dr. Orland Mustard 02/04/22 Reviewed care management services and to call if needs change          SDOH assessments and interventions completed:  Yes  SDOH Interventions Today    Flowsheet Row Most Recent Value  SDOH Interventions   Food Insecurity Interventions Intervention Not Indicated  Housing Interventions Intervention Not Indicated  Transportation Interventions Intervention Not Indicated        Care Coordination Interventions Activated:  Yes  Care Coordination Interventions:  Yes, provided   Follow up plan: No further intervention required.   Encounter Outcome:  Pt. Visit Completed  Peter Garter RN, BSN,CCM, CDE Care Management Coordinator [employer] [phone]

## 2022-02-04 DIAGNOSIS — C220 Liver cell carcinoma: Secondary | ICD-10-CM | POA: Diagnosis not present

## 2022-02-04 DIAGNOSIS — E119 Type 2 diabetes mellitus without complications: Secondary | ICD-10-CM | POA: Diagnosis not present

## 2022-02-04 DIAGNOSIS — R7309 Other abnormal glucose: Secondary | ICD-10-CM | POA: Diagnosis not present

## 2022-02-04 DIAGNOSIS — I700 Atherosclerosis of aorta: Secondary | ICD-10-CM | POA: Diagnosis not present

## 2022-02-04 DIAGNOSIS — M4810 Ankylosing hyperostosis [Forestier], site unspecified: Secondary | ICD-10-CM | POA: Diagnosis not present

## 2022-02-04 DIAGNOSIS — K7460 Unspecified cirrhosis of liver: Secondary | ICD-10-CM | POA: Diagnosis not present

## 2022-02-04 DIAGNOSIS — E43 Unspecified severe protein-calorie malnutrition: Secondary | ICD-10-CM | POA: Diagnosis not present

## 2022-02-04 DIAGNOSIS — D61818 Other pancytopenia: Secondary | ICD-10-CM | POA: Diagnosis not present

## 2022-02-04 DIAGNOSIS — R296 Repeated falls: Secondary | ICD-10-CM | POA: Diagnosis not present

## 2022-02-04 DIAGNOSIS — Z0000 Encounter for general adult medical examination without abnormal findings: Secondary | ICD-10-CM | POA: Diagnosis not present

## 2022-02-04 DIAGNOSIS — M80052D Age-related osteoporosis with current pathological fracture, left femur, subsequent encounter for fracture with routine healing: Secondary | ICD-10-CM | POA: Diagnosis not present

## 2022-02-04 DIAGNOSIS — K7581 Nonalcoholic steatohepatitis (NASH): Secondary | ICD-10-CM | POA: Diagnosis not present

## 2022-02-04 DIAGNOSIS — Z13220 Encounter for screening for lipoid disorders: Secondary | ICD-10-CM | POA: Diagnosis not present

## 2022-02-04 DIAGNOSIS — Z125 Encounter for screening for malignant neoplasm of prostate: Secondary | ICD-10-CM | POA: Diagnosis not present

## 2022-02-05 DIAGNOSIS — K7460 Unspecified cirrhosis of liver: Secondary | ICD-10-CM | POA: Diagnosis not present

## 2022-02-05 DIAGNOSIS — E119 Type 2 diabetes mellitus without complications: Secondary | ICD-10-CM | POA: Diagnosis not present

## 2022-02-05 DIAGNOSIS — K7581 Nonalcoholic steatohepatitis (NASH): Secondary | ICD-10-CM | POA: Diagnosis not present

## 2022-02-05 DIAGNOSIS — E43 Unspecified severe protein-calorie malnutrition: Secondary | ICD-10-CM | POA: Diagnosis not present

## 2022-02-05 DIAGNOSIS — M80052D Age-related osteoporosis with current pathological fracture, left femur, subsequent encounter for fracture with routine healing: Secondary | ICD-10-CM | POA: Diagnosis not present

## 2022-02-05 DIAGNOSIS — R296 Repeated falls: Secondary | ICD-10-CM | POA: Diagnosis not present

## 2022-02-06 DIAGNOSIS — E43 Unspecified severe protein-calorie malnutrition: Secondary | ICD-10-CM | POA: Diagnosis not present

## 2022-02-06 DIAGNOSIS — K7581 Nonalcoholic steatohepatitis (NASH): Secondary | ICD-10-CM | POA: Diagnosis not present

## 2022-02-06 DIAGNOSIS — K7460 Unspecified cirrhosis of liver: Secondary | ICD-10-CM | POA: Diagnosis not present

## 2022-02-06 DIAGNOSIS — M80052D Age-related osteoporosis with current pathological fracture, left femur, subsequent encounter for fracture with routine healing: Secondary | ICD-10-CM | POA: Diagnosis not present

## 2022-02-06 DIAGNOSIS — R296 Repeated falls: Secondary | ICD-10-CM | POA: Diagnosis not present

## 2022-02-06 DIAGNOSIS — E119 Type 2 diabetes mellitus without complications: Secondary | ICD-10-CM | POA: Diagnosis not present

## 2022-02-07 DIAGNOSIS — K7460 Unspecified cirrhosis of liver: Secondary | ICD-10-CM | POA: Diagnosis not present

## 2022-02-07 DIAGNOSIS — R296 Repeated falls: Secondary | ICD-10-CM | POA: Diagnosis not present

## 2022-02-07 DIAGNOSIS — E119 Type 2 diabetes mellitus without complications: Secondary | ICD-10-CM | POA: Diagnosis not present

## 2022-02-07 DIAGNOSIS — E43 Unspecified severe protein-calorie malnutrition: Secondary | ICD-10-CM | POA: Diagnosis not present

## 2022-02-07 DIAGNOSIS — K7581 Nonalcoholic steatohepatitis (NASH): Secondary | ICD-10-CM | POA: Diagnosis not present

## 2022-02-07 DIAGNOSIS — M80052D Age-related osteoporosis with current pathological fracture, left femur, subsequent encounter for fracture with routine healing: Secondary | ICD-10-CM | POA: Diagnosis not present

## 2022-02-08 DIAGNOSIS — M80052D Age-related osteoporosis with current pathological fracture, left femur, subsequent encounter for fracture with routine healing: Secondary | ICD-10-CM | POA: Diagnosis not present

## 2022-02-08 DIAGNOSIS — E43 Unspecified severe protein-calorie malnutrition: Secondary | ICD-10-CM | POA: Diagnosis not present

## 2022-02-08 DIAGNOSIS — K7460 Unspecified cirrhosis of liver: Secondary | ICD-10-CM | POA: Diagnosis not present

## 2022-02-08 DIAGNOSIS — K7581 Nonalcoholic steatohepatitis (NASH): Secondary | ICD-10-CM | POA: Diagnosis not present

## 2022-02-08 DIAGNOSIS — E119 Type 2 diabetes mellitus without complications: Secondary | ICD-10-CM | POA: Diagnosis not present

## 2022-02-08 DIAGNOSIS — R296 Repeated falls: Secondary | ICD-10-CM | POA: Diagnosis not present

## 2022-02-11 DIAGNOSIS — K7581 Nonalcoholic steatohepatitis (NASH): Secondary | ICD-10-CM | POA: Diagnosis not present

## 2022-02-11 DIAGNOSIS — G4730 Sleep apnea, unspecified: Secondary | ICD-10-CM | POA: Diagnosis not present

## 2022-02-11 DIAGNOSIS — Z885 Allergy status to narcotic agent status: Secondary | ICD-10-CM | POA: Diagnosis not present

## 2022-02-11 DIAGNOSIS — G40209 Localization-related (focal) (partial) symptomatic epilepsy and epileptic syndromes with complex partial seizures, not intractable, without status epilepticus: Secondary | ICD-10-CM | POA: Diagnosis not present

## 2022-02-11 DIAGNOSIS — K769 Liver disease, unspecified: Secondary | ICD-10-CM | POA: Diagnosis not present

## 2022-02-11 DIAGNOSIS — I10 Essential (primary) hypertension: Secondary | ICD-10-CM | POA: Diagnosis not present

## 2022-02-11 DIAGNOSIS — Z8619 Personal history of other infectious and parasitic diseases: Secondary | ICD-10-CM | POA: Diagnosis not present

## 2022-02-11 DIAGNOSIS — K7460 Unspecified cirrhosis of liver: Secondary | ICD-10-CM | POA: Diagnosis not present

## 2022-02-11 DIAGNOSIS — K5010 Crohn's disease of large intestine without complications: Secondary | ICD-10-CM | POA: Diagnosis not present

## 2022-02-11 DIAGNOSIS — E119 Type 2 diabetes mellitus without complications: Secondary | ICD-10-CM | POA: Diagnosis not present

## 2022-02-11 DIAGNOSIS — F32A Depression, unspecified: Secondary | ICD-10-CM | POA: Diagnosis not present

## 2022-02-11 DIAGNOSIS — M4810 Ankylosing hyperostosis [Forestier], site unspecified: Secondary | ICD-10-CM | POA: Diagnosis not present

## 2022-02-11 DIAGNOSIS — C220 Liver cell carcinoma: Secondary | ICD-10-CM | POA: Diagnosis not present

## 2022-02-11 DIAGNOSIS — Z882 Allergy status to sulfonamides status: Secondary | ICD-10-CM | POA: Diagnosis not present

## 2022-02-12 ENCOUNTER — Encounter (INDEPENDENT_AMBULATORY_CARE_PROVIDER_SITE_OTHER): Payer: 59 | Admitting: Ophthalmology

## 2022-02-12 DIAGNOSIS — G40209 Localization-related (focal) (partial) symptomatic epilepsy and epileptic syndromes with complex partial seizures, not intractable, without status epilepticus: Secondary | ICD-10-CM | POA: Diagnosis not present

## 2022-02-12 DIAGNOSIS — F32A Depression, unspecified: Secondary | ICD-10-CM | POA: Diagnosis not present

## 2022-02-12 DIAGNOSIS — K5010 Crohn's disease of large intestine without complications: Secondary | ICD-10-CM | POA: Diagnosis not present

## 2022-02-12 DIAGNOSIS — E119 Type 2 diabetes mellitus without complications: Secondary | ICD-10-CM | POA: Diagnosis not present

## 2022-02-12 DIAGNOSIS — M4810 Ankylosing hyperostosis [Forestier], site unspecified: Secondary | ICD-10-CM | POA: Diagnosis not present

## 2022-02-12 DIAGNOSIS — C220 Liver cell carcinoma: Secondary | ICD-10-CM | POA: Diagnosis not present

## 2022-02-14 DIAGNOSIS — R296 Repeated falls: Secondary | ICD-10-CM | POA: Diagnosis not present

## 2022-02-14 DIAGNOSIS — K7460 Unspecified cirrhosis of liver: Secondary | ICD-10-CM | POA: Diagnosis not present

## 2022-02-14 DIAGNOSIS — E43 Unspecified severe protein-calorie malnutrition: Secondary | ICD-10-CM | POA: Diagnosis not present

## 2022-02-14 DIAGNOSIS — M80052D Age-related osteoporosis with current pathological fracture, left femur, subsequent encounter for fracture with routine healing: Secondary | ICD-10-CM | POA: Diagnosis not present

## 2022-02-14 DIAGNOSIS — K7581 Nonalcoholic steatohepatitis (NASH): Secondary | ICD-10-CM | POA: Diagnosis not present

## 2022-02-14 DIAGNOSIS — E119 Type 2 diabetes mellitus without complications: Secondary | ICD-10-CM | POA: Diagnosis not present

## 2022-02-19 DIAGNOSIS — F332 Major depressive disorder, recurrent severe without psychotic features: Secondary | ICD-10-CM | POA: Diagnosis not present

## 2022-02-19 DIAGNOSIS — Z79899 Other long term (current) drug therapy: Secondary | ICD-10-CM | POA: Insufficient documentation

## 2022-02-19 DIAGNOSIS — F4312 Post-traumatic stress disorder, chronic: Secondary | ICD-10-CM | POA: Diagnosis not present

## 2022-02-19 DIAGNOSIS — F411 Generalized anxiety disorder: Secondary | ICD-10-CM | POA: Diagnosis not present

## 2022-02-20 DIAGNOSIS — K7460 Unspecified cirrhosis of liver: Secondary | ICD-10-CM | POA: Diagnosis not present

## 2022-02-20 DIAGNOSIS — C4492 Squamous cell carcinoma of skin, unspecified: Secondary | ICD-10-CM | POA: Diagnosis not present

## 2022-02-20 DIAGNOSIS — C220 Liver cell carcinoma: Secondary | ICD-10-CM | POA: Diagnosis not present

## 2022-02-20 DIAGNOSIS — Z5112 Encounter for antineoplastic immunotherapy: Secondary | ICD-10-CM | POA: Diagnosis not present

## 2022-02-20 DIAGNOSIS — Z79899 Other long term (current) drug therapy: Secondary | ICD-10-CM | POA: Diagnosis not present

## 2022-02-21 DIAGNOSIS — R296 Repeated falls: Secondary | ICD-10-CM | POA: Diagnosis not present

## 2022-02-21 DIAGNOSIS — E119 Type 2 diabetes mellitus without complications: Secondary | ICD-10-CM | POA: Diagnosis not present

## 2022-02-21 DIAGNOSIS — M80052D Age-related osteoporosis with current pathological fracture, left femur, subsequent encounter for fracture with routine healing: Secondary | ICD-10-CM | POA: Diagnosis not present

## 2022-02-21 DIAGNOSIS — K7581 Nonalcoholic steatohepatitis (NASH): Secondary | ICD-10-CM | POA: Diagnosis not present

## 2022-02-21 DIAGNOSIS — K7460 Unspecified cirrhosis of liver: Secondary | ICD-10-CM | POA: Diagnosis not present

## 2022-02-21 DIAGNOSIS — E43 Unspecified severe protein-calorie malnutrition: Secondary | ICD-10-CM | POA: Diagnosis not present

## 2022-02-22 ENCOUNTER — Other Ambulatory Visit: Payer: Self-pay | Admitting: Internal Medicine

## 2022-02-22 DIAGNOSIS — M810 Age-related osteoporosis without current pathological fracture: Secondary | ICD-10-CM

## 2022-02-22 DIAGNOSIS — C801 Malignant (primary) neoplasm, unspecified: Secondary | ICD-10-CM | POA: Diagnosis not present

## 2022-02-22 DIAGNOSIS — I700 Atherosclerosis of aorta: Secondary | ICD-10-CM | POA: Diagnosis not present

## 2022-02-22 DIAGNOSIS — K7581 Nonalcoholic steatohepatitis (NASH): Secondary | ICD-10-CM | POA: Diagnosis not present

## 2022-02-22 DIAGNOSIS — K5090 Crohn's disease, unspecified, without complications: Secondary | ICD-10-CM | POA: Diagnosis not present

## 2022-02-22 DIAGNOSIS — M4810 Ankylosing hyperostosis [Forestier], site unspecified: Secondary | ICD-10-CM | POA: Diagnosis not present

## 2022-02-22 DIAGNOSIS — R296 Repeated falls: Secondary | ICD-10-CM | POA: Diagnosis not present

## 2022-02-22 DIAGNOSIS — D61818 Other pancytopenia: Secondary | ICD-10-CM | POA: Diagnosis not present

## 2022-02-28 NOTE — Progress Notes (Signed)
[facility] Note  03/06/2022     CHIEF COMPLAINT Patient presents for Retina Follow Up  HISTORY OF PRESENT ILLNESS: [name] [name] is a 61 y.o. male who presents to the clinic today for:   HPI     Retina Follow Up   Patient presents with  Other.  In right eye.  This started months ago.  Duration of 4 months.  I, the attending physician,  performed the HPI with the patient and updated documentation appropriately.        Comments   Patient denies noticing any vision changes at this time. He sees floaters every now and then.       Last edited by Bernarda Caffey, MD on 03/07/2022 10:27 PM.    Patient has been diagnosed with primary liver cancer. He also fell and broke his hip.    Referring physician: [name], MD [address],  [place] [address]  HISTORICAL INFORMATION:   Selected notes from the MEDICAL RECORD NUMBER Referred by Dr Lucianne Lei for concern of shallow RD OD   CURRENT MEDICATIONS: Current Outpatient Medications (Ophthalmic Drugs)  Medication Sig   cycloSPORINE (RESTASIS) 0.05 % ophthalmic emulsion Place 1 drop into both eyes 2 (two) times daily as needed (dry eyes).   latanoprost (XALATAN) 0.005 % ophthalmic solution Place 1 drop into both eyes at bedtime.    No current facility-administered medications for this visit. (Ophthalmic Drugs)   Current Outpatient Medications (Other)  Medication Sig   acetaminophen (TYLENOL) 500 MG tablet Take 2 tablets (1,000 mg total) by mouth 3 (three) times daily.   acitretin ([name]) 25 MG capsule Take 25 mg by mouth daily.   apixaban (ELIQUIS) 2.5 MG TABS tablet Take 1 tablet (2.5 mg total) by mouth 2 (two) times daily. (Patient not taking: Reported on 03/06/2022)   CALCIUM PO Take 1 tablet by mouth daily.   cemiplimab-rwlc (LIBTAYO) 350 MG/7ML SOLN injection Inject into the vein every 6 (six) weeks.   Cholecalciferol (VITAMIN D-3 PO) Take 1 capsule by mouth daily.    desonide (DESOWEN) 0.05 % ointment Apply 1 Application topically 3 (three) times daily. Mix with Mupirocin 2%. To lower lip.   diclofenac Sodium (VOLTAREN) 1 % GEL Apply 2 g topically 4 (four) times daily.   docusate sodium (COLACE) 100 MG capsule Take 1 capsule (100 mg total) by mouth 2 (two) times daily. (Patient not taking: Reported on 03/06/2022)   esomeprazole (NEXIUM) 40 MG capsule Take 1 capsule (40 mg total) by mouth 2 (two) times daily before a meal.   Eszopiclone 3 MG TABS Take 3 mg by mouth at bedtime as needed (sleep).   feeding supplement (ENSURE ENLIVE / ENSURE PLUS) LIQD Take 237 mLs by mouth 2 (two) times daily between meals.   folic acid (FOLVITE) 1 MG tablet Take 2 tablets (2 mg total) by mouth daily.   hydrocortisone 2.5 % cream Apply 1 Application topically 2 (two) times daily as needed (Itchy skin).   Lactase (DAIRY RELIEF PO) Take 1 tablet by mouth in the morning and at bedtime.   lamoTRIgine (LAMICTAL) 150 MG tablet Take 1 tablet (150 mg total) by mouth 2 (two) times daily.   lidocaine (LIDODERM) 5 % Place 1 patch onto the skin daily. Remove & Discard patch within 12 hours or as directed by MD (Patient not taking: Reported on 03/06/2022)   mesalamine (LIALDA) 1.2 g EC tablet Take 2 tablets (2.4 g total) by mouth 2 (  two) times daily. (Patient taking differently: Take 2.4 g by mouth daily.)   methocarbamol (ROBAXIN) 750 MG tablet Take 1 tablet (750 mg total) by mouth every 8 (eight) hours as needed for muscle spasms. (Patient not taking: Reported on 03/06/2022)   methscopolamine (PAMINE FORTE) 5 MG tablet Take 1 tablet (5 mg total) by mouth 2 (two) times daily.   mupirocin ointment (BACTROBAN) 2 % Apply 1 Application topically 3 (three) times daily. Mix with Desonide 0.05%. To lower lip.   Oxycodone HCl 10 MG TABS Take 1-1.5 tablets (10-15 mg total) by mouth every 4 (four) hours as needed.   potassium chloride SA (KLOR-CON) 20 MEQ tablet Take 1 tablet (20 mEq total) by mouth 2 (two)  times daily.   Probiotic Product (PROBIOTIC PO) Take 1 tablet at bedtime by mouth.    rifaximin (XIFAXAN) 550 MG TABS tablet Take 1 tablet (550 mg total) by mouth 2 (two) times daily.   sertraline (ZOLOFT) 100 MG tablet Take 200 mg daily with breakfast by mouth.    tamsulosin (FLOMAX) 0.4 MG CAPS capsule Take 0.4 mg by mouth at bedtime. (Patient not taking: Reported on 03/06/2022)   tolterodine (DETROL LA) 4 MG 24 hr capsule Take 4 mg by mouth daily.   traZODone (DESYREL) 150 MG tablet Take 150 mg by mouth at bedtime.   No current facility-administered medications for this visit. (Other)   Facility-Administered Medications Ordered in Other Visits (Other)  Medication Route   gadopentetate dimeglumine (MAGNEVIST) injection 20 mL Intravenous   REVIEW OF SYSTEMS: ROS   Positive for: Gastrointestinal, Neurological, Skin, Musculoskeletal, Endocrine, Eyes, Heme/Lymph Negative for: Constitutional, Genitourinary, HENT, Cardiovascular, Respiratory, Psychiatric, Allergic/Imm Last edited by Annie Paras, COT on 03/06/2022 10:05 AM.     ALLERGIES Allergies  Allergen Reactions   Sulfonamide Derivatives Hives   Z-Pak [Azithromycin] Swelling   Dilaudid [Hydromorphone Hcl] Nausea And Vomiting   Sulfa Antibiotics Hives and Rash   PAST MEDICAL HISTORY Past Medical History:  Diagnosis Date   Adenomatous colon polyp 10/1999   Anxiety    Arthritis    neck, yoga helps.   Cataract    Complex partial seizure ([place])    last seizure was 08-30-2018   Crohn's disease of small and large intestines ([facility]) 1999   Depression    Diabetes mellitus    no meds at this time 10-24-17   Esophageal stricture    External hemorrhoids    GERD (gastroesophageal reflux disease)    Glaucoma    History of kidney stones    3 large stones still present   Hyperlipemia    Hypertension    resolved with weight loss    Inguinal hernia, bilateral 12/2019   Liver cirrhosis secondary to NASH [facility])    Migraines     Osteoporosis    Pathological fracture of left hip due to osteoporosis ([name]) 12/18/2021   PONV (postoperative nausea and vomiting)    PTSD (post-traumatic stress disorder)    Renal cyst, acquired, left 07/08/2017   Skin cancer    squamous cell multiple; [facility]; followed every 3 months.   Sleep apnea    CPAP machine, uses nightly   Staphylococcus aureus bacteremia with sepsis ([place]) 05/28/2017   MRSA 2012   Past Surgical History:  Procedure Laterality Date   CATARACT EXTRACTION Bilateral    COLONOSCOPY     DEBRIDEMENT MANDIBLE N/A 12/07/2020   Procedure: DEBRIDEMENT OF MANDIBLE;  Surgeon: [name], DMD;  Location: [facility];  Service: Oral Surgery;  Laterality:  N/A;   ELBOW SURGERY  2012   elbow MRSA infection    EYE SURGERY     cataracts removed, /w IOL   GROSS TEETH DEBRIDEMENT N/A 12/29/2020   Procedure: MANDIBULAR  DEBRIDEMENT;  Surgeon: [name], DMD;  Location: [facility];  Service: Dentistry;  Laterality: N/A;   INCISION AND DRAINAGE ABSCESS N/A 12/07/2020   Procedure: INCISION AND DRAINAGE MANDIBULAR ABSCESS;  Surgeon: [name], DMD;  Location: [facility];  Service: Oral Surgery;  Laterality: N/A;   INGUINAL HERNIA REPAIR Right 01/14/2020   Procedure: OPEN RIGHT HERNIA REPAIR INGUINAL WITH MESH;  Surgeon: [name], MD;  Location: [facility];  Service: General;  Laterality: Right;   INSERTION OF MESH N/A 07/01/2016   Procedure: INSERTION OF MESH;  Surgeon: [name], MD;  Location: [facility];  Service: General;  Laterality: N/A;   INTRAMEDULLARY (IM) NAIL INTERTROCHANTERIC Left 12/17/2021   Procedure: INTRAMEDULLARY (IM) NAIL INTERTROCHANTRIC;  Surgeon: Altamese [place], MD;  Location: [facility];  Service: Orthopedics;  Laterality: Left;   IR URETERAL STENT LEFT NEW ACCESS W/O SEP NEPHROSTOMY CATH  02/02/2018   NEPHROLITHOTOMY Left 02/02/2018   Procedure: NEPHROLITHOTOMY PERCUTANEOUS;  Surgeon: [name], MD;  Location: [facility];  Service: Urology;  Laterality: Left;    POLYPECTOMY     SCALP LACERATION REPAIR Right 10/16/2017   From fall/staples   SHOULDER ARTHROSCOPY Right 03/05/2018   Procedure: ARTHROSCOPY SHOULDER AND OPEN DISTAL CLAVICLE EXCISION;  Surgeon: [name], MD;  Location: [facility];  Service: Orthopedics;  Laterality: Right;   SHOULDER SURGERY     SINUS SURGERY WITH INSTATRAK     TEE WITHOUT CARDIOVERSION N/A 05/09/2017   Procedure: TRANSESOPHAGEAL ECHOCARDIOGRAM (TEE);  Surgeon: [name], MD;  [facility];  Service: Cardiovascular;  Laterality: N/A;   TOOTH EXTRACTION N/A 12/07/2020   Procedure: DENTAL EXTRACTIONS OF TEETH TWENTY TO THIRTY;  Surgeon: [name], DMD;  Location: [facility];  Service: Oral Surgery;  Laterality: N/A;   TOOTH EXTRACTION N/A 12/29/2020   Procedure: DENTAL RESTORATION/EXTRACTIONS;  Surgeon: [name], DMD;  Location: [facility];  Service: Dentistry;  Laterality: N/A;   TRANSTHORACIC ECHOCARDIOGRAM  02/22/2011   EF 55-65%; increased pattern of LVH with mild conc hypertrophy, abnormal relaxation & increased filling pressure (grade 2 diastolic dysfunction); atrial septum thickened (lipomatous hypertrophy)   UMBILICAL HERNIA REPAIR N/A 07/01/2016   Procedure: UMBILICAL HERNIA REPAIR WITH MESH;  Surgeon: [name], MD;  Location: [facility];  Service: General;  Laterality: N/A;   UPPER GASTROINTESTINAL ENDOSCOPY     VASECTOMY     FAMILY HISTORY Family History  Problem Relation Age of Onset   Heart disease Mother        CABG at age 21   Hyperlipidemia Mother    Hypertension Mother    Diabetes Father    Colon polyps Father    Stroke Father    Hyperlipidemia Father    Hypertension Father    Stroke Paternal Grandmother    Stroke Paternal Grandfather    Other Child        tetrology of fallot (cornealia deland syndrome)   Colon cancer Neg Hx    Esophageal cancer Neg Hx    Stomach cancer Neg Hx    Rectal cancer Neg Hx    SOCIAL HISTORY Social History   Tobacco Use   Smoking status:  Never    Passive exposure: Never   Smokeless tobacco: Never  Vaping Use   Vaping Use: Never used  Substance Use Topics   Alcohol use: Never   Drug use: Never       OPHTHALMIC EXAM:  Base Eye Exam     Visual Acuity (Snellen - Linear)       Right Left   Dist [place] 20/25 20/80 +2   Dist ph   20/50         Tonometry (Tonopen, 10:15 AM)       Right Left   Pressure 11 10         Pupils       Dark Light Shape React APD   Right 3 2 Round Brisk None   Left 3 2 Round Brisk None         Visual Fields       Left Right   Restrictions Partial outer superior temporal, inferior temporal, inferior nasal deficiencies Partial outer superior temporal, inferior temporal, superior nasal, inferior nasal deficiencies         Extraocular Movement       Right Left    Full, Ortho Full, Ortho         Neuro/Psych     Oriented x3: Yes   Mood/Affect: Normal         Dilation     Both eyes: 1.0% Mydriacyl, 2.5% Phenylephrine @ 10:06 AM           Slit Lamp and Fundus Exam     External Exam       Right Left   External SCC right upper cheek          Slit Lamp Exam       Right Left   Lids/Lashes Dermatochalasis - upper lid, Telangiectasia, Meibomian gland dysfunction Dermatochalasis - upper lid, Meibomian gland dysfunction   Conjunctiva/Sclera White and quiet White and quiet   Cornea trace inferior Punctate epithelial erosions, tear film debris, +mucous, Well healed  cataract wound trace PEE, tear film debris   Anterior Chamber Deep and quiet Deep and quiet   Iris round and reactive round and reactive   Lens Toric PC IOL in good position with marks at 0600 and 1200, 1+ Posterior capsular opacification PC IOL in good position, 1-2+ Posterior capsular opacification   Anterior Vitreous Vitreous syneresis Vitreous syneresis         Fundus Exam       Right Left   Disc Pink and Sharp, thin inferior rim mild Pallor, Sharp rim   C/D Ratio 0.6 0.5   Macula  Flat, Good foveal reflex, mild RPE mottling, no heme Flat, Blunted foveal reflex, mild RPE mottling   Vessels attenuated, mild tortuosity, mild Copper wiring mild attenuation, mild tortuosity   Periphery Small residual pocket of schisis and SRF at 1130 - appears resolved; view limited by poor dilation; pigmented pavingstone IT quad, pigmented CR scarring and pavingstone temporally, no obvious metastatic lesions Attached, CR scarring and atrophy / pavingstone ST / IT           Refraction     Manifest Refraction       Sphere Cylinder Dist VA   Right      Left -1.25 Sphere 20/30-1           IMAGING AND PROCEDURES  Imaging and Procedures for 03/06/2022  OCT, Retina - OU - Both Eyes       Right Eye Quality was good. Central Foveal Thickness: 300. Progression has been stable. Findings include normal foveal contour, no IRF, no SRF, vitreomacular adhesion (Focal shallow SRF within schisis  cavity -- schisis detachment superior periphery, separate focal schisis cavity ST periphery -- both caught on widefield - not imaged today).   Left Eye Quality was good. Central Foveal Thickness: 285. Progression has improved. Findings include normal foveal contour, no IRF, no SRF (Persistent cystic changes inferior macula - slightly improved).   Notes *Images captured and stored on drive  Diagnosis / Impression:  OD: Focal shallow SRF within schisis cavity -- schisis detachment superior periphery, separate focal schisis cavity ST periphery -- previously caught on widefield -- not imaged today OS: persistent cystic changes inferior macula--slightly improved  Clinical management:  See below  Abbreviations: NFP - Normal foveal profile. CME - cystoid macular edema. PED - pigment epithelial detachment. IRF - intraretinal fluid. SRF - subretinal fluid. EZ - ellipsoid zone. ERM - epiretinal membrane. ORA - outer retinal atrophy. ORT - outer retinal tubulation. SRHM - subretinal hyper-reflective  material. IRHM - intraretinal hyper-reflective material            ASSESSMENT/PLAN:    ICD-10-CM   1. Right retinoschisis  H33.101     2. Right retinal detachment  H33.21     3. Post-radiation retinopathy, subsequent encounter  T66.XXXD    H35.89     4. Essential hypertension  I10     5. Hypertensive retinopathy of both eyes  H35.033 OCT, Retina - OU - Both Eyes    6. Pseudophakia, both eyes  Z96.1     7. Low-tension glaucoma of both eyes, mild stage  H40.1231      1-3. 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 Hypertensive retinopathy OU - discussed importance of tight BP control - continue to monitor   6. Pseudophakia OU  - s/p CE/IOL  - IOL in good position, doing well  - continue to monitor  7. Low tension glaucoma  - managed by Dr. Lucianne Lei  - IOP: 11,10  - Latanoprost OU QHS   Ophthalmic Meds Ordered this visit:  No orders of the defined types were placed in this encounter.    Return in about 3 months (around 06/05/2022) for f/u retinoschisis OU , DFE, OCT, Optos color photos, FAF.  There are no Patient Instructions on file for this visit.  Explained the diagnoses, plan, and follow up with the patient and they expressed understanding.  Patient expressed understanding of the importance of proper follow up care.   This document serves as a record of  services personally performed by Gardiner Sleeper, MD, PhD. It was created on their behalf by Orvan Falconer, an ophthalmic technician. The creation of this record is the provider's dictation and/or activities during the visit.    Electronically signed by: Orvan Falconer, OA, 03/07/22  10:29 PM  This document serves as a record of services personally performed by Gardiner Sleeper, MD, PhD. It was created on their behalf by Renaldo Reel, [place] an ophthalmic technician. The creation of this record is the provider's dictation  and/or activities during the visit.    Electronically signed by:  Renaldo Reel, COT  03/06/22 10:29 PM  Gardiner Sleeper, M.D., Ph.D. Diseases & Surgery of the Retina and Vitreous [facility]  I have reviewed the above documentation for accuracy and completeness, and I agree with the above. Gardiner Sleeper, M.D., Ph.D. 03/07/22 10:29 PM  Abbreviations: M myopia (nearsighted); A astigmatism; H hyperopia (farsighted); P presbyopia; Mrx spectacle prescription;  CTL contact lenses; OD right eye; OS left eye; OU both eyes  XT exotropia; ET esotropia; PEK punctate epithelial keratitis; PEE punctate epithelial erosions; DES dry eye syndrome; MGD meibomian gland dysfunction; ATs artificial tears; PFAT's preservative free artificial tears; [place] nuclear sclerotic cataract; PSC posterior subcapsular cataract; ERM epi-retinal membrane; PVD posterior vitreous detachment; RD retinal detachment; DM diabetes mellitus; DR diabetic retinopathy; NPDR non-proliferative diabetic retinopathy; PDR proliferative diabetic retinopathy; CSME clinically significant macular edema; DME diabetic macular edema; dbh dot blot hemorrhages; CWS cotton wool spot; POAG primary open angle glaucoma; C/D cup-to-disc ratio; HVF humphrey visual field; GVF goldmann visual field; OCT optical coherence tomography; IOP intraocular pressure; BRVO Branch retinal vein occlusion; CRVO central retinal  vein occlusion; CRAO central retinal artery occlusion; BRAO branch retinal artery occlusion; RT retinal tear; SB scleral buckle; PPV pars plana vitrectomy; VH Vitreous hemorrhage; PRP panretinal laser photocoagulation; IVK intravitreal kenalog; VMT vitreomacular traction; MH Macular hole;  NVD neovascularization of the disc; NVE neovascularization elsewhere; AREDS age related eye disease study; ARMD age related macular degeneration; POAG primary open angle glaucoma; EBMD epithelial/anterior basement membrane dystrophy; ACIOL anterior chamber intraocular lens; IOL intraocular lens; PCIOL posterior chamber intraocular lens; Phaco/IOL phacoemulsification with intraocular lens placement; [place] photorefractive keratectomy; LASIK laser assisted in situ keratomileusis; HTN hypertension; DM diabetes mellitus; COPD chronic obstructive pulmonary disease

## 2022-03-05 DIAGNOSIS — D485 Neoplasm of uncertain behavior of skin: Secondary | ICD-10-CM | POA: Diagnosis not present

## 2022-03-05 DIAGNOSIS — L814 Other melanin hyperpigmentation: Secondary | ICD-10-CM | POA: Diagnosis not present

## 2022-03-05 DIAGNOSIS — L570 Actinic keratosis: Secondary | ICD-10-CM | POA: Diagnosis not present

## 2022-03-05 DIAGNOSIS — C44729 Squamous cell carcinoma of skin of left lower limb, including hip: Secondary | ICD-10-CM | POA: Diagnosis not present

## 2022-03-05 DIAGNOSIS — L853 Xerosis cutis: Secondary | ICD-10-CM | POA: Diagnosis not present

## 2022-03-05 DIAGNOSIS — Z79899 Other long term (current) drug therapy: Secondary | ICD-10-CM | POA: Diagnosis not present

## 2022-03-05 DIAGNOSIS — Z85828 Personal history of other malignant neoplasm of skin: Secondary | ICD-10-CM | POA: Diagnosis not present

## 2022-03-06 ENCOUNTER — Encounter: Payer: Self-pay | Admitting: Rehabilitative and Restorative Service Providers"

## 2022-03-06 ENCOUNTER — Ambulatory Visit: Payer: Medicare Other | Attending: Family Medicine | Admitting: Rehabilitative and Restorative Service Providers"

## 2022-03-06 ENCOUNTER — Ambulatory Visit (INDEPENDENT_AMBULATORY_CARE_PROVIDER_SITE_OTHER): Payer: Medicare Other | Admitting: Ophthalmology

## 2022-03-06 ENCOUNTER — Other Ambulatory Visit: Payer: Self-pay

## 2022-03-06 DIAGNOSIS — C44329 Squamous cell carcinoma of skin of other parts of face: Secondary | ICD-10-CM

## 2022-03-06 DIAGNOSIS — H3321 Serous retinal detachment, right eye: Secondary | ICD-10-CM

## 2022-03-06 DIAGNOSIS — R2689 Other abnormalities of gait and mobility: Secondary | ICD-10-CM | POA: Insufficient documentation

## 2022-03-06 DIAGNOSIS — R279 Unspecified lack of coordination: Secondary | ICD-10-CM | POA: Insufficient documentation

## 2022-03-06 DIAGNOSIS — H3589 Other specified retinal disorders: Secondary | ICD-10-CM

## 2022-03-06 DIAGNOSIS — M6281 Muscle weakness (generalized): Secondary | ICD-10-CM | POA: Diagnosis not present

## 2022-03-06 DIAGNOSIS — I10 Essential (primary) hypertension: Secondary | ICD-10-CM

## 2022-03-06 DIAGNOSIS — H33101 Unspecified retinoschisis, right eye: Secondary | ICD-10-CM | POA: Diagnosis not present

## 2022-03-06 DIAGNOSIS — H401231 Low-tension glaucoma, bilateral, mild stage: Secondary | ICD-10-CM

## 2022-03-06 DIAGNOSIS — R296 Repeated falls: Secondary | ICD-10-CM | POA: Diagnosis not present

## 2022-03-06 DIAGNOSIS — Z961 Presence of intraocular lens: Secondary | ICD-10-CM

## 2022-03-06 DIAGNOSIS — H35033 Hypertensive retinopathy, bilateral: Secondary | ICD-10-CM | POA: Diagnosis not present

## 2022-03-06 NOTE — Therapy (Signed)
OUTPATIENT PHYSICAL THERAPY LOWER EXTREMITY EVALUATION   Patient Name: [name] [name] MRN: [id number] DOB:[birth year], 61 y.o., male Today's Date: 03/06/2022   PT End of Session - 03/06/22 1251     Visit Number 1    Date for PT Re-Evaluation 05/01/22    Authorization Type Medicare    Progress Note Due on Visit 10    PT Start Time 1240    PT Stop Time 1315    PT Time Calculation (min) 35 min    Activity Tolerance Patient tolerated treatment well    Behavior During Therapy [facility] for tasks assessed/performed             Past Medical History:  Diagnosis Date   Adenomatous colon polyp 10/1999   Anxiety    Arthritis    neck, yoga helps.   Cataract    Complex partial seizure ([place])    last seizure was 08-30-2018   Crohn's disease of small and large intestines ([place]) 1999   Depression    Diabetes mellitus    no meds at this time 10-24-17   Esophageal stricture    External hemorrhoids    GERD (gastroesophageal reflux disease)    Glaucoma    History of kidney stones    3 large stones still present   Hyperlipemia    Hypertension    resolved with weight loss    Inguinal hernia, bilateral 12/2019   Liver cirrhosis secondary to NASH [facility])    Migraines    Osteoporosis    Pathological fracture of left hip due to osteoporosis ([place]) 12/18/2021   PONV (postoperative nausea and vomiting)    PTSD (post-traumatic stress disorder)    Renal cyst, acquired, left 07/08/2017   Skin cancer    squamous cell multiple; [place]; followed every 3 months.   Sleep apnea    CPAP machine, uses nightly   Staphylococcus aureus bacteremia with sepsis ([place]) 05/28/2017   MRSA 2012   Past Surgical History:  Procedure Laterality Date   CATARACT EXTRACTION Bilateral    COLONOSCOPY     DEBRIDEMENT MANDIBLE N/A 12/07/2020   Procedure: DEBRIDEMENT OF MANDIBLE;  Surgeon: [name], DMD;  Location: [place];  Service: Oral Surgery;  Laterality: N/A;   ELBOW SURGERY  2012   elbow MRSA infection    EYE  SURGERY     cataracts removed, /w IOL   GROSS TEETH DEBRIDEMENT N/A 12/29/2020   Procedure: MANDIBULAR  DEBRIDEMENT;  Surgeon: [name], DMD;  Location: [place];  Service: Dentistry;  Laterality: N/A;   INCISION AND DRAINAGE ABSCESS N/A 12/07/2020   Procedure: INCISION AND DRAINAGE MANDIBULAR ABSCESS;  Surgeon: [name], DMD;  Location: [place];  Service: Oral Surgery;  Laterality: N/A;   INGUINAL HERNIA REPAIR Right 01/14/2020   Procedure: OPEN RIGHT HERNIA REPAIR INGUINAL WITH MESH;  Surgeon: [name], MD;  Location: [facility];  Service: General;  Laterality: Right;   INSERTION OF MESH N/A 07/01/2016   Procedure: INSERTION OF MESH;  Surgeon: [name], MD;  Location: [place];  Service: General;  Laterality: N/A;   INTRAMEDULLARY (IM) NAIL INTERTROCHANTERIC Left 12/17/2021   Procedure: INTRAMEDULLARY (IM) NAIL INTERTROCHANTRIC;  Surgeon: Altamese Stockdale, MD;  Location: [place];  Service: Orthopedics;  Laterality: Left;   IR URETERAL STENT LEFT NEW ACCESS W/O SEP NEPHROSTOMY CATH  02/02/2018   NEPHROLITHOTOMY Left 02/02/2018   Procedure: NEPHROLITHOTOMY PERCUTANEOUS;  Surgeon: [name], MD;  Location: [facility];  Service: Urology;  Laterality: Left;   POLYPECTOMY  SCALP LACERATION REPAIR Right 10/16/2017   From fall/staples   SHOULDER ARTHROSCOPY Right 03/05/2018   Procedure: ARTHROSCOPY SHOULDER AND OPEN DISTAL CLAVICLE EXCISION;  Surgeon: [name], MD;  Location: [place];  Service: Orthopedics;  Laterality: Right;   SHOULDER SURGERY     SINUS SURGERY WITH INSTATRAK     TEE WITHOUT CARDIOVERSION N/A 05/09/2017   Procedure: TRANSESOPHAGEAL ECHOCARDIOGRAM (TEE);  Surgeon: [name], MD;  Location: [facility];  Service: Cardiovascular;  Laterality: N/A;   TOOTH EXTRACTION N/A 12/07/2020   Procedure: DENTAL EXTRACTIONS OF TEETH TWENTY TO THIRTY;  Surgeon: [name], DMD;  Location: [place];  Service: Oral Surgery;  Laterality: N/A;   TOOTH EXTRACTION N/A  12/29/2020   Procedure: DENTAL RESTORATION/EXTRACTIONS;  Surgeon: [name], DMD;  Location: [place];  Service: Dentistry;  Laterality: N/A;   TRANSTHORACIC ECHOCARDIOGRAM  02/22/2011   EF 55-65%; increased pattern of LVH with mild conc hypertrophy, abnormal relaxation & increased filling pressure (grade 2 diastolic dysfunction); atrial septum thickened (lipomatous hypertrophy)   UMBILICAL HERNIA REPAIR N/A 07/01/2016   Procedure: UMBILICAL HERNIA REPAIR WITH MESH;  Surgeon: [name], MD;  Location: [place];  Service: General;  Laterality: N/A;   UPPER GASTROINTESTINAL ENDOSCOPY     VASECTOMY     Patient Active Problem List   Diagnosis Date Noted   Protein-calorie malnutrition, severe 12/19/2021   Pathological fracture of left hip due to osteoporosis ([place]) 12/18/2021   Closed nondisplaced intertrochanteric fracture of left femur, initial encounter ([place]) 12/17/2021   Rib fracture 12/17/2021   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 05/11/2021   Liver cirrhosis secondary to NASH with liver mass     GERD (gastroesophageal reflux disease)    Pancytopenia ([facility])    Osteomyelitis of jaw 12/29/2020   Acute osteomyelitis of mandible 12/26/2020   S/P inguinal hernia repair 01/14/2020   At high risk for falls    S/P shoulder surgery 03/05/2018   Excessive daytime sleepiness 02/18/2018   Crohn's disease of colon with rectal bleeding ([place]) 02/18/2018   Iron deficiency anemia due to chronic blood loss and thrombocytopenia  02/18/2018   Iron deficiency anemia due to sideropenic dysphagia 02/18/2018   Renal calculus, left 02/02/2018   History of sepsis 12/16/2017   Renal cyst, acquired, left 07/08/2017   Frequent falls 07/08/2017   Renal lesion 05/28/2017   MSSA bacteremia 05/06/2017   Depression 04/30/2016   Memory loss 04/30/2016   OSA on CPAP 10/31/2015   Nonepileptic episode ([place]) 04/25/2015   Seizure ([place]) 04/04/2013   DOE (dyspnea on exertion) 03/12/2013   Complex partial  seizure ([place]) 04/19/2012   DM2 (diabetes mellitus, type 2) ([place]) 09/25/2011   OTHER DYSPHAGIA 03/27/2009   GERD 12/07/2007   EXTERNAL HEMORRHOIDS 09/11/2007   CROHN'S DISEASE, LARGE AND SMALL INTESTINES 09/11/2007   OSTEOPOROSIS 09/11/2007    PCP: [name], MD   REFERRING PROVIDER: [name], MD   REFERRING DIAG: R29.6 (ICD-10-CM) - Repeated falls S72.009A (ICD-10-CM) - Hip fracture ([place])   THERAPY DIAG:  Other abnormalities of gait and mobility - Plan: PT plan of care cert/re-cert  Muscle weakness (generalized) - Plan: PT plan of care cert/re-cert  Repeated falls - Plan: PT plan of care cert/re-cert  Unspecified lack of coordination - Plan: PT plan of care cert/re-cert  Rationale for Evaluation and Treatment Rehabilitation  ONSET DATE: 12/17/2021  SUBJECTIVE:   SUBJECTIVE STATEMENT: Pt reports that he had a fall in the end of June and sustained a left  hip fracture and is s/p IM nail on 12/17/21 by Dr Marcelino Scot.  Pt reports that he also sustained a rib fracture at the time.  PERTINENT HISTORY: Seizure disorder, chron's, T2DM, depression and anxiety, GERD with hx of esophageal stricture, HTN, HLD, liver cirrhosis secondary to NASH, OSA on cpap, keratinizing SCC of the left cheek currently on neoadjuvant cemiplimab. Surgical resection on 09/18/21.  PAIN:  Are you having pain? Yes: NPRS scale: 6-7/10 Pain location: right shoulder, left hip Pain description: throbbing Aggravating factors: increased activity Relieving factors: medication  PRECAUTIONS: Fall  WEIGHT BEARING RESTRICTIONS No  FALLS:  Has patient fallen in last 6 months? Yes. Number of falls 1 major fall and several losses of balance  LIVING ENVIRONMENT: Lives with: lives with their spouse Lives in: House/apartment Stairs: Yes: Internal: 14 steps; on right going up and External: 2 steps; on left going up Has following equipment at home: Single point cane, Environmental consultant - 2 wheeled, Wheelchair (manual), Hydrographic surveyor, and Grab bars  OCCUPATION: retired  PLOF: Independent with household mobility with device  PATIENT GOALS:  Decrease risk of falling, improve balance, and get stronger.   OBJECTIVE:   DIAGNOSTIC FINDINGS:  Left hip radiograph on 12/17/21: IMPRESSION: Mildly displaced intertrochanteric fracture of the left hip  PATIENT SURVEYS:  LEFS TBA  COGNITION:  Overall cognitive status: Within functional limits for tasks assessed     MUSCLE LENGTH: Tightness in bilat hamstrings  POSTURE: rounded shoulders and forward head  PALPATION: Some tenderness to palpation along right shoulder and left hip  LOWER EXTREMITY ROM:  WFL  LOWER EXTREMITY MMT:  03/06/2022: Right shoulder strength of 4/5 Left hip/knee strength of 4/5 Right hip strength of 4+/5, Left knee strength of 5/5   FUNCTIONAL TESTS:  03/06/2022: 5 times sit to stand: 22.4 sec with UE use .  Pt with increased difficulty on first stand.  GAIT: Distance walked: 100 ft Assistive device utilized: Single point cane Level of assistance: SBA Comments: Pt with antalgic gait with some unsteadiness noted    TODAY'S TREATMENT: 03/06/2022:  Reviewed HEP and safety at home with use of [facility]   PATIENT EDUCATION:  Education details: Issued HEP Person educated: Patient and Spouse Education method: Explanation, Demonstration, and Handouts Education comprehension: verbalized understanding   HOME EXERCISE PROGRAM: Access Code: [email or web address] URL: [email or web address] Date: 03/06/2022 Prepared by: Juel Burrow  Exercises - Seated Long Arc Quad  - 1 x daily - 7 x weekly - 2 sets - 10 reps - Seated March  - 1 x daily - 7 x weekly - 2 sets - 10 reps - Standing March with Counter Support  - 1 x daily - 7 x weekly - 2 sets - 10 reps - Standing Hip Abduction with Counter Support  - 1 x daily - 7 x weekly - 2 sets - 10 reps  ASSESSMENT:  CLINICAL IMPRESSION: Patient is a 61 y.o. male who was seen today for  physical therapy evaluation and treatment for Hx of falls with recent L hip intertrochanteric fracture s/p IM nailing on 12/17/21 by Dr Marcelino Scot. Pt underwent home health PT following surgical repair.  Pt also reports a new diagnosis of liver cancer since his most recent discharge from outpatient PT.  Pt presents with right shoulder pain, left hip pain, muscle weakness, difficulty walking, and decreased balance.  Pt would benefit from skilled PT to progress towards goal related activities.   OBJECTIVE IMPAIRMENTS decreased balance, decreased coordination, difficulty walking, decreased strength, dizziness, impaired flexibility, postural dysfunction, and  pain.   ACTIVITY LIMITATIONS carrying, lifting, bending, squatting, and stairs  PARTICIPATION LIMITATIONS: community activity and yard work  [place], Time since onset of injury/illness/exacerbation, and 3+ comorbidities: Hx of cancer, dizziness, Seizure disorder  are also affecting patient's functional outcome.   REHAB POTENTIAL: Good  CLINICAL DECISION MAKING: Evolving/moderate complexity  EVALUATION COMPLEXITY: Moderate   GOALS: Goals reviewed with patient? Yes  SHORT TERM GOALS: Target date: 03/27/2022  Pt will be independent with initial HEP. Baseline: Goal status: INITIAL  2.  Pt will be able to perform sit to/from stand from various surfaces independent with good safety on first attempt and independent. Baseline:  Goal status: INITIAL   LONG TERM GOALS: Target date: 05/01/2022   Pt will be independent with advanced HEP. Baseline:  Goal status: INITIAL  2.  Pt will increase left hip and right shoulder strength to at least 4+/5 to allow him to perform household tasks with improved ease. Baseline:  Goal status: INITIAL  3.  Pt will decrease 5 times sit to/from stand time to in 15 seconds or less to indicate improved functional mobility. Baseline:  Goal status: INITIAL  4.  Pt will be able to ambulate at least  30 minutes to walk the dog without a loss of balance and without increased hip pain. Baseline:  Goal status: INITIAL  5.  Pt and wife will report improved safety at home with no falls/losses of balance in week prior to discharge. Baseline:  Goal status: INITIAL   PLAN: PT FREQUENCY: 1-2x/week  PT DURATION: 8 weeks  PLANNED INTERVENTIONS: Therapeutic exercises, Therapeutic activity, Neuromuscular re-education, Balance training, Gait training, Patient/Family education, Self Care, Joint mobilization, Joint manipulation, Stair training, Aquatic Therapy, Dry Needling, Electrical stimulation, Spinal manipulation, Spinal mobilization, Cryotherapy, Moist heat, Taping, Ultrasound, Ionotophoresis 47m/ml Dexamethasone, Manual therapy, and Re-evaluation  PLAN FOR NEXT SESSION: assess and progress with HEP as indicated, strengthening, balance   SJuel Burrow PT 03/06/2022, 1:42 PM   [facility] SReece CityGMeridian [place] [address]Phone # [phone] [phone]

## 2022-03-07 ENCOUNTER — Encounter (INDEPENDENT_AMBULATORY_CARE_PROVIDER_SITE_OTHER): Payer: Self-pay | Admitting: Ophthalmology

## 2022-03-08 ENCOUNTER — Ambulatory Visit: Payer: Medicare Other | Admitting: Rehabilitative and Restorative Service Providers"

## 2022-03-08 ENCOUNTER — Encounter: Payer: Self-pay | Admitting: Rehabilitative and Restorative Service Providers"

## 2022-03-08 DIAGNOSIS — R279 Unspecified lack of coordination: Secondary | ICD-10-CM

## 2022-03-08 DIAGNOSIS — R296 Repeated falls: Secondary | ICD-10-CM

## 2022-03-08 DIAGNOSIS — M6281 Muscle weakness (generalized): Secondary | ICD-10-CM

## 2022-03-08 DIAGNOSIS — R2689 Other abnormalities of gait and mobility: Secondary | ICD-10-CM

## 2022-03-08 NOTE — Therapy (Signed)
OUTPATIENT PHYSICAL THERAPY TREATMENT NOTE   Patient Name: [name] [name] MRN: [id number] DOB:January 19, 1961, 61 y.o., male Today's Date: 03/08/2022   PT End of Session - 03/08/22 1027     Visit Number 2    Date for PT Re-Evaluation 05/01/22    Authorization Type Medicare    Progress Note Due on Visit 10    PT Start Time 1015    PT Stop Time 1055    PT Time Calculation (min) 40 min    Activity Tolerance Patient tolerated treatment well    Behavior During Therapy [place] for tasks assessed/performed             Past Medical History:  Diagnosis Date   Adenomatous colon polyp 10/1999   Anxiety    Arthritis    neck, yoga helps.   Cataract    Complex partial seizure ([place])    last seizure was 08-30-2018   Crohn's disease of small and large intestines ([place]) 1999   Depression    Diabetes mellitus    no meds at this time 10-24-17   Esophageal stricture    External hemorrhoids    GERD (gastroesophageal reflux disease)    Glaucoma    History of kidney stones    3 large stones still present   Hyperlipemia    Hypertension    resolved with weight loss    Inguinal hernia, bilateral 12/2019   Liver cirrhosis secondary to NASH [facility])    Migraines    Osteoporosis    Pathological fracture of left hip due to osteoporosis ([place]) 12/18/2021   PONV (postoperative nausea and vomiting)    PTSD (post-traumatic stress disorder)    Renal cyst, acquired, left 07/08/2017   Skin cancer    squamous cell multiple; [place]; followed every 3 months.   Sleep apnea    CPAP machine, uses nightly   Staphylococcus aureus bacteremia with sepsis ([facility]) 05/28/2017   MRSA 2012   Past Surgical History:  Procedure Laterality Date   CATARACT EXTRACTION Bilateral    COLONOSCOPY     DEBRIDEMENT MANDIBLE N/A 12/07/2020   Procedure: DEBRIDEMENT OF MANDIBLE;  Surgeon: [name], DMD;  Location: [place];  Service: Oral Surgery;  Laterality: N/A;   ELBOW SURGERY  2012   elbow MRSA infection    EYE SURGERY      cataracts removed, /w IOL   GROSS TEETH DEBRIDEMENT N/A 12/29/2020   Procedure: MANDIBULAR  DEBRIDEMENT;  Surgeon: [name], DMD;  Location: [place];  Service: Dentistry;  Laterality: N/A;   INCISION AND DRAINAGE ABSCESS N/A 12/07/2020   Procedure: INCISION AND DRAINAGE MANDIBULAR ABSCESS;  Surgeon: [name], DMD;  Location: [place];  Service: Oral Surgery;  Laterality: N/A;   INGUINAL HERNIA REPAIR Right 01/14/2020   Procedure: OPEN RIGHT HERNIA REPAIR INGUINAL WITH MESH;  Surgeon: [name], MD;  Location: [facility];  Service: General;  Laterality: Right;   INSERTION OF MESH N/A 07/01/2016   Procedure: INSERTION OF MESH;  Surgeon: [name], MD;  Location: [place];  Service: General;  Laterality: N/A;   INTRAMEDULLARY (IM) NAIL INTERTROCHANTERIC Left 12/17/2021   Procedure: INTRAMEDULLARY (IM) NAIL INTERTROCHANTRIC;  Surgeon: Altamese Sansom [place], MD;  Location: [place];  Service: Orthopedics;  Laterality: Left;   IR URETERAL STENT LEFT NEW ACCESS W/O SEP NEPHROSTOMY CATH  02/02/2018   NEPHROLITHOTOMY Left 02/02/2018   Procedure: NEPHROLITHOTOMY PERCUTANEOUS;  Surgeon: [name], MD;  Location: [facility];  Service: Urology;  Laterality: Left;   POLYPECTOMY  SCALP LACERATION REPAIR Right 10/16/2017   From fall/staples   SHOULDER ARTHROSCOPY Right 03/05/2018   Procedure: ARTHROSCOPY SHOULDER AND OPEN DISTAL CLAVICLE EXCISION;  Surgeon: [name], MD;  Location: [place];  Service: Orthopedics;  Laterality: Right;   SHOULDER SURGERY     SINUS SURGERY WITH INSTATRAK     TEE WITHOUT CARDIOVERSION N/A 05/09/2017   Procedure: TRANSESOPHAGEAL ECHOCARDIOGRAM (TEE);  Surgeon: [name], MD;  [facility];  Service: Cardiovascular;  Laterality: N/A;   TOOTH EXTRACTION N/A 12/07/2020   Procedure: DENTAL EXTRACTIONS OF TEETH TWENTY TO THIRTY;  Surgeon: [name], DMD;  Location: [place];  Service: Oral Surgery;  Laterality: N/A;   TOOTH EXTRACTION N/A 12/29/2020    Procedure: DENTAL RESTORATION/EXTRACTIONS;  Surgeon: [name], DMD;  Location: [place];  Service: Dentistry;  Laterality: N/A;   TRANSTHORACIC ECHOCARDIOGRAM  02/22/2011   EF 55-65%; increased pattern of LVH with mild conc hypertrophy, abnormal relaxation & increased filling pressure (grade 2 diastolic dysfunction); atrial septum thickened (lipomatous hypertrophy)   UMBILICAL HERNIA REPAIR N/A 07/01/2016   Procedure: UMBILICAL HERNIA REPAIR WITH MESH;  Surgeon: [name], MD;  Location: [place];  Service: General;  Laterality: N/A;   UPPER GASTROINTESTINAL ENDOSCOPY     VASECTOMY     Patient Active Problem List   Diagnosis Date Noted   Protein-calorie malnutrition, severe 12/19/2021   Pathological fracture of left hip due to osteoporosis ([place]) 12/18/2021   Closed nondisplaced intertrochanteric fracture of left femur, initial encounter ([place]) 12/17/2021   Rib fracture 12/17/2021   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 05/11/2021   Liver cirrhosis secondary to NASH with liver mass     GERD (gastroesophageal reflux disease)    Pancytopenia ([facility])    Osteomyelitis of jaw 12/29/2020   Acute osteomyelitis of mandible 12/26/2020   S/P inguinal hernia repair 01/14/2020   At high risk for falls    S/P shoulder surgery 03/05/2018   Excessive daytime sleepiness 02/18/2018   Crohn's disease of colon with rectal bleeding ([place]) 02/18/2018   Iron deficiency anemia due to chronic blood loss and thrombocytopenia  02/18/2018   Iron deficiency anemia due to sideropenic dysphagia 02/18/2018   Renal calculus, left 02/02/2018   History of sepsis 12/16/2017   Renal cyst, acquired, left 07/08/2017   Frequent falls 07/08/2017   Renal lesion 05/28/2017   MSSA bacteremia 05/06/2017   Depression 04/30/2016   Memory loss 04/30/2016   OSA on CPAP 10/31/2015   Nonepileptic episode ([place]) 04/25/2015   Seizure ([place]) 04/04/2013   DOE (dyspnea on exertion) 03/12/2013   Complex partial seizure  ([place]) 04/19/2012   DM2 (diabetes mellitus, type 2) ([place]) 09/25/2011   OTHER DYSPHAGIA 03/27/2009   GERD 12/07/2007   EXTERNAL HEMORRHOIDS 09/11/2007   CROHN'S DISEASE, LARGE AND SMALL INTESTINES 09/11/2007   OSTEOPOROSIS 09/11/2007    PCP: [name], MD   REFERRING PROVIDER: [name], MD   REFERRING DIAG: R29.6 (ICD-10-CM) - Repeated falls S72.009A (ICD-10-CM) - Hip fracture ([place])   THERAPY DIAG:  Other abnormalities of gait and mobility  Muscle weakness (generalized)  Repeated falls  Unspecified lack of coordination  Rationale for Evaluation and Treatment Rehabilitation  ONSET DATE: 12/17/2021  SUBJECTIVE:   SUBJECTIVE STATEMENT: Pt reports that he woke up with increased pain last night and after he took some medication, he started feeling better.  Pt reports that he is feeling better now.  PERTINENT HISTORY: Seizure disorder, chron's, T2DM, depression and anxiety, GERD with hx of  esophageal stricture, HTN, HLD, liver cirrhosis secondary to NASH, OSA on cpap, keratinizing SCC of the left cheek currently on neoadjuvant cemiplimab. Surgical resection on 09/18/21.  PAIN:  Are you having pain? Yes: NPRS scale: 6-8/10 Pain location: right shoulder, left hip Pain description: throbbing Aggravating factors: increased activity Relieving factors: medication  PRECAUTIONS: Fall  WEIGHT BEARING RESTRICTIONS No  FALLS:  Has patient fallen in last 6 months? Yes. Number of falls 1 major fall and several losses of balance  LIVING ENVIRONMENT: Lives with: lives with their spouse Lives in: House/apartment Stairs: Yes: Internal: 14 steps; on right going up and External: 2 steps; on left going up Has following equipment at home: Single point cane, Environmental consultant - 2 wheeled, Wheelchair (manual), Electronics engineer, and Grab bars  OCCUPATION: retired  PLOF: Independent with household mobility with device  PATIENT GOALS:  Decrease risk of falling, improve balance, and get  stronger.   OBJECTIVE:   DIAGNOSTIC FINDINGS:  Left hip radiograph on 12/17/21: IMPRESSION: Mildly displaced intertrochanteric fracture of the left hip  PATIENT SURVEYS:  LEFS TBA  COGNITION:  Overall cognitive status: Within functional limits for tasks assessed     MUSCLE LENGTH: Tightness in bilat hamstrings  POSTURE: rounded shoulders and forward head  PALPATION: Some tenderness to palpation along right shoulder and left hip  LOWER EXTREMITY ROM:  WFL  LOWER EXTREMITY MMT:  03/06/2022: Right shoulder strength of 4/5 Left hip/knee strength of 4/5 Right hip strength of 4+/5, Left knee strength of 5/5  03/08/2022: 3 minute walk test:  499 ft without assistive device with SBA   FUNCTIONAL TESTS:  03/06/2022: 5 times sit to stand: 22.4 sec with UE use .  Pt with increased difficulty on first stand.  GAIT: Distance walked: 100 ft Assistive device utilized: Single point cane Level of assistance: SBA Comments: Pt with antalgic gait with some unsteadiness noted    TODAY'S TREATMENT: 03/08/2022: Nustep level 3 x7 min with PT present to discuss status Seated with 2: heel/toe raises, LAQ, marching, hip abduction scissors, hip adduction ball squeeze, hip ER.  BLE 2x10 bilat each Sit to/from holding 5# kettlebell:  x10 with chest press, x10 with overhead press Standing and performing ball toss x20 throws 3 min walk test:  499 ft without assistive device with SBA and occasional veering off path and brushing against walls   03/06/2022:  Reviewed HEP and safety at home with use of [facility]   PATIENT EDUCATION:  Education details: Issued HEP Person educated: Patient and Spouse Education method: Explanation, Demonstration, and Handouts Education comprehension: verbalized understanding   HOME EXERCISE PROGRAM: Access Code: [email or web address] URL: [email or web address] Date: 03/06/2022 Prepared by: Juel Burrow  Exercises - Seated Long Arc Quad  - 1 x daily - 7 x  weekly - 2 sets - 10 reps - Seated March  - 1 x daily - 7 x weekly - 2 sets - 10 reps - Standing March with Counter Support  - 1 x daily - 7 x weekly - 2 sets - 10 reps - Standing Hip Abduction with Counter Support  - 1 x daily - 7 x weekly - 2 sets - 10 reps  ASSESSMENT:  CLINICAL IMPRESSION: [name] [name] presents to skilled PT reporting hip and shoulder pain, but less severe than last night.  Pt able to progress through exercise routine with cuing for technique and pacing.  Pt with some unsteadiness noted during ambulation, but no loss of balance noted.  Pt able to perform ball toss on compliant surface without a  loss of balance.  Pt continues to require skilled PT to progress with goal related activities.   OBJECTIVE IMPAIRMENTS decreased balance, decreased coordination, difficulty walking, decreased strength, dizziness, impaired flexibility, postural dysfunction, and pain.   ACTIVITY LIMITATIONS carrying, lifting, bending, squatting, and stairs  PARTICIPATION LIMITATIONS: community activity and yard work  [place], Time since onset of injury/illness/exacerbation, and 3+ comorbidities: Hx of cancer, dizziness, Seizure disorder  are also affecting patient's functional outcome.   REHAB POTENTIAL: Good  CLINICAL DECISION MAKING: Evolving/moderate complexity  EVALUATION COMPLEXITY: Moderate   GOALS: Goals reviewed with patient? Yes  SHORT TERM GOALS: Target date: 03/27/2022  Pt will be independent with initial HEP. Baseline: Goal status: INITIAL  2.  Pt will be able to perform sit to/from stand from various surfaces independent with good safety on first attempt and independent. Baseline:  Goal status: INITIAL   LONG TERM GOALS: Target date: 05/01/2022   Pt will be independent with advanced HEP. Baseline:  Goal status: INITIAL  2.  Pt will increase left hip and right shoulder strength to at least 4+/5 to allow him to perform household tasks with improved  ease. Baseline:  Goal status: INITIAL  3.  Pt will decrease 5 times sit to/from stand time to in 15 seconds or less to indicate improved functional mobility. Baseline:  Goal status: INITIAL  4.  Pt will be able to ambulate at least 30 minutes to walk the dog without a loss of balance and without increased hip pain. Baseline:  Goal status: INITIAL  5.  Pt and wife will report improved safety at home with no falls/losses of balance in week prior to discharge. Baseline:  Goal status: INITIAL   PLAN: PT FREQUENCY: 1-2x/week  PT DURATION: 8 weeks  PLANNED INTERVENTIONS: Therapeutic exercises, Therapeutic activity, Neuromuscular re-education, Balance training, Gait training, Patient/Family education, Self Care, Joint mobilization, Joint manipulation, Stair training, Aquatic Therapy, Dry Needling, Electrical stimulation, Spinal manipulation, Spinal mobilization, Cryotherapy, Moist heat, Taping, Ultrasound, Ionotophoresis 40m/ml Dexamethasone, Manual therapy, and Re-evaluation  PLAN FOR NEXT SESSION: assess and progress with HEP as indicated, strengthening, balance   SJuel Burrow PT 03/08/2022, 11:01 AM   [facility] SCasa [place] O[place] Helix[place] [address]Phone # [phone] [phone]

## 2022-03-12 DIAGNOSIS — Z23 Encounter for immunization: Secondary | ICD-10-CM | POA: Diagnosis not present

## 2022-03-13 ENCOUNTER — Telehealth: Payer: Self-pay

## 2022-03-13 DIAGNOSIS — S72145D Nondisplaced intertrochanteric fracture of left femur, subsequent encounter for closed fracture with routine healing: Secondary | ICD-10-CM | POA: Diagnosis not present

## 2022-03-13 NOTE — Telephone Encounter (Signed)
Spoke with patient's spouse [name] and scheduled a Mychart Palliative Consult for 03/25/22 @ 2:30 PM per request.  Consent obtained; updated Netsmart, Team List and Epic.

## 2022-03-14 ENCOUNTER — Ambulatory Visit: Payer: 59 | Admitting: Neurology

## 2022-03-15 ENCOUNTER — Encounter: Payer: Self-pay | Admitting: Physical Therapy

## 2022-03-15 ENCOUNTER — Ambulatory Visit: Payer: Medicare Other | Admitting: Physical Therapy

## 2022-03-15 DIAGNOSIS — E43 Unspecified severe protein-calorie malnutrition: Secondary | ICD-10-CM | POA: Diagnosis present

## 2022-03-15 DIAGNOSIS — G9340 Encephalopathy, unspecified: Secondary | ICD-10-CM | POA: Diagnosis not present

## 2022-03-15 DIAGNOSIS — S22050A Wedge compression fracture of T5-T6 vertebra, initial encounter for closed fracture: Secondary | ICD-10-CM | POA: Diagnosis not present

## 2022-03-15 DIAGNOSIS — Z79899 Other long term (current) drug therapy: Secondary | ICD-10-CM | POA: Diagnosis not present

## 2022-03-15 DIAGNOSIS — I700 Atherosclerosis of aorta: Secondary | ICD-10-CM | POA: Diagnosis not present

## 2022-03-15 DIAGNOSIS — N200 Calculus of kidney: Secondary | ICD-10-CM | POA: Diagnosis not present

## 2022-03-15 DIAGNOSIS — K7581 Nonalcoholic steatohepatitis (NASH): Secondary | ICD-10-CM | POA: Diagnosis present

## 2022-03-15 DIAGNOSIS — M6281 Muscle weakness (generalized): Secondary | ICD-10-CM

## 2022-03-15 DIAGNOSIS — D696 Thrombocytopenia, unspecified: Secondary | ICD-10-CM | POA: Diagnosis not present

## 2022-03-15 DIAGNOSIS — K219 Gastro-esophageal reflux disease without esophagitis: Secondary | ICD-10-CM | POA: Diagnosis not present

## 2022-03-15 DIAGNOSIS — K5080 Crohn's disease of both small and large intestine without complications: Secondary | ICD-10-CM | POA: Diagnosis present

## 2022-03-15 DIAGNOSIS — E119 Type 2 diabetes mellitus without complications: Secondary | ICD-10-CM | POA: Diagnosis not present

## 2022-03-15 DIAGNOSIS — Z85828 Personal history of other malignant neoplasm of skin: Secondary | ICD-10-CM | POA: Diagnosis not present

## 2022-03-15 DIAGNOSIS — W19XXXA Unspecified fall, initial encounter: Secondary | ICD-10-CM | POA: Diagnosis not present

## 2022-03-15 DIAGNOSIS — R279 Unspecified lack of coordination: Secondary | ICD-10-CM

## 2022-03-15 DIAGNOSIS — N202 Calculus of kidney with calculus of ureter: Secondary | ICD-10-CM | POA: Diagnosis present

## 2022-03-15 DIAGNOSIS — K7460 Unspecified cirrhosis of liver: Secondary | ICD-10-CM | POA: Diagnosis not present

## 2022-03-15 DIAGNOSIS — I10 Essential (primary) hypertension: Secondary | ICD-10-CM | POA: Diagnosis not present

## 2022-03-15 DIAGNOSIS — R509 Fever, unspecified: Secondary | ICD-10-CM | POA: Diagnosis not present

## 2022-03-15 DIAGNOSIS — D649 Anemia, unspecified: Secondary | ICD-10-CM | POA: Diagnosis present

## 2022-03-15 DIAGNOSIS — E785 Hyperlipidemia, unspecified: Secondary | ICD-10-CM | POA: Diagnosis present

## 2022-03-15 DIAGNOSIS — Z20822 Contact with and (suspected) exposure to covid-19: Secondary | ICD-10-CM | POA: Diagnosis present

## 2022-03-15 DIAGNOSIS — F418 Other specified anxiety disorders: Secondary | ICD-10-CM | POA: Diagnosis not present

## 2022-03-15 DIAGNOSIS — Y929 Unspecified place or not applicable: Secondary | ICD-10-CM | POA: Diagnosis not present

## 2022-03-15 DIAGNOSIS — S2241XA Multiple fractures of ribs, right side, initial encounter for closed fracture: Secondary | ICD-10-CM | POA: Diagnosis not present

## 2022-03-15 DIAGNOSIS — R2689 Other abnormalities of gait and mobility: Secondary | ICD-10-CM

## 2022-03-15 DIAGNOSIS — N1330 Unspecified hydronephrosis: Secondary | ICD-10-CM | POA: Diagnosis not present

## 2022-03-15 DIAGNOSIS — N210 Calculus in bladder: Secondary | ICD-10-CM | POA: Diagnosis not present

## 2022-03-15 DIAGNOSIS — M549 Dorsalgia, unspecified: Secondary | ICD-10-CM | POA: Diagnosis not present

## 2022-03-15 DIAGNOSIS — G40209 Localization-related (focal) (partial) symptomatic epilepsy and epileptic syndromes with complex partial seizures, not intractable, without status epilepticus: Secondary | ICD-10-CM | POA: Diagnosis not present

## 2022-03-15 DIAGNOSIS — S2243XA Multiple fractures of ribs, bilateral, initial encounter for closed fracture: Secondary | ICD-10-CM | POA: Diagnosis not present

## 2022-03-15 DIAGNOSIS — M47812 Spondylosis without myelopathy or radiculopathy, cervical region: Secondary | ICD-10-CM | POA: Diagnosis not present

## 2022-03-15 DIAGNOSIS — K7682 Hepatic encephalopathy: Secondary | ICD-10-CM | POA: Diagnosis present

## 2022-03-15 DIAGNOSIS — R000 Tachycardia, unspecified: Secondary | ICD-10-CM | POA: Diagnosis not present

## 2022-03-15 DIAGNOSIS — N130 Hydronephrosis with ureteropelvic junction obstruction: Secondary | ICD-10-CM | POA: Diagnosis not present

## 2022-03-15 DIAGNOSIS — R296 Repeated falls: Secondary | ICD-10-CM

## 2022-03-15 DIAGNOSIS — N281 Cyst of kidney, acquired: Secondary | ICD-10-CM | POA: Diagnosis not present

## 2022-03-15 DIAGNOSIS — F321 Major depressive disorder, single episode, moderate: Secondary | ICD-10-CM | POA: Diagnosis not present

## 2022-03-15 DIAGNOSIS — G9341 Metabolic encephalopathy: Secondary | ICD-10-CM | POA: Diagnosis present

## 2022-03-15 DIAGNOSIS — K766 Portal hypertension: Secondary | ICD-10-CM | POA: Diagnosis not present

## 2022-03-15 DIAGNOSIS — N201 Calculus of ureter: Secondary | ICD-10-CM | POA: Diagnosis not present

## 2022-03-15 DIAGNOSIS — K828 Other specified diseases of gallbladder: Secondary | ICD-10-CM | POA: Diagnosis not present

## 2022-03-15 DIAGNOSIS — S2242XD Multiple fractures of ribs, left side, subsequent encounter for fracture with routine healing: Secondary | ICD-10-CM | POA: Diagnosis not present

## 2022-03-15 DIAGNOSIS — E876 Hypokalemia: Secondary | ICD-10-CM | POA: Diagnosis not present

## 2022-03-15 DIAGNOSIS — C719 Malignant neoplasm of brain, unspecified: Secondary | ICD-10-CM | POA: Diagnosis not present

## 2022-03-15 DIAGNOSIS — F32A Depression, unspecified: Secondary | ICD-10-CM | POA: Diagnosis present

## 2022-03-15 DIAGNOSIS — N390 Urinary tract infection, site not specified: Secondary | ICD-10-CM | POA: Diagnosis not present

## 2022-03-15 DIAGNOSIS — N136 Pyonephrosis: Secondary | ICD-10-CM | POA: Diagnosis present

## 2022-03-15 DIAGNOSIS — A419 Sepsis, unspecified organism: Secondary | ICD-10-CM | POA: Diagnosis not present

## 2022-03-15 DIAGNOSIS — C229 Malignant neoplasm of liver, not specified as primary or secondary: Secondary | ICD-10-CM | POA: Diagnosis present

## 2022-03-15 NOTE — Therapy (Signed)
OUTPATIENT PHYSICAL THERAPY TREATMENT NOTE   Patient Name: [name] [name] MRN: [id number] DOB:June 18, 1961, 61 y.o., male Today's Date: 03/15/2022   PT End of Session - 03/15/22 1103     Visit Number 3    Date for PT Re-Evaluation 05/01/22    Authorization Type Medicare    Progress Note Due on Visit 10    PT Start Time 1103    PT Stop Time 1141    PT Time Calculation (min) 38 min    Activity Tolerance Patient tolerated treatment well    Behavior During Therapy [facility] for tasks assessed/performed              Past Medical History:  Diagnosis Date   Adenomatous colon polyp 10/1999   Anxiety    Arthritis    neck, yoga helps.   Cataract    Complex partial seizure ([place])    last seizure was 08-30-2018   Crohn's disease of small and large intestines ([place]) 1999   Depression    Diabetes mellitus    no meds at this time 10-24-17   Esophageal stricture    External hemorrhoids    GERD (gastroesophageal reflux disease)    Glaucoma    History of kidney stones    3 large stones still present   Hyperlipemia    Hypertension    resolved with weight loss    Inguinal hernia, bilateral 12/2019   Liver cirrhosis secondary to NASH [facility])    Migraines    Osteoporosis    Pathological fracture of left hip due to osteoporosis ([place]) 12/18/2021   PONV (postoperative nausea and vomiting)    PTSD (post-traumatic stress disorder)    Renal cyst, acquired, left 07/08/2017   Skin cancer    squamous cell multiple; [place]; followed every 3 months.   Sleep apnea    CPAP machine, uses nightly   Staphylococcus aureus bacteremia with sepsis ([place]) 05/28/2017   MRSA 2012   Past Surgical History:  Procedure Laterality Date   CATARACT EXTRACTION Bilateral    COLONOSCOPY     DEBRIDEMENT MANDIBLE N/A 12/07/2020   Procedure: DEBRIDEMENT OF MANDIBLE;  Surgeon: [name], DMD;  Location: [place];  Service: Oral Surgery;  Laterality: N/A;   ELBOW SURGERY  2012   elbow MRSA infection    EYE SURGERY      cataracts removed, /w IOL   GROSS TEETH DEBRIDEMENT N/A 12/29/2020   Procedure: MANDIBULAR  DEBRIDEMENT;  Surgeon: [name], DMD;  Location: [place];  Service: Dentistry;  Laterality: N/A;   INCISION AND DRAINAGE ABSCESS N/A 12/07/2020   Procedure: INCISION AND DRAINAGE MANDIBULAR ABSCESS;  Surgeon: [name], DMD;  Location: [place];  Service: Oral Surgery;  Laterality: N/A;   INGUINAL HERNIA REPAIR Right 01/14/2020   Procedure: OPEN RIGHT HERNIA REPAIR INGUINAL WITH MESH;  Surgeon: [name], MD;  Location: [facility];  Service: General;  Laterality: Right;   INSERTION OF MESH N/A 07/01/2016   Procedure: INSERTION OF MESH;  Surgeon: [name], MD;  Location: [place];  Service: General;  Laterality: N/A;   INTRAMEDULLARY (IM) NAIL INTERTROCHANTERIC Left 12/17/2021   Procedure: INTRAMEDULLARY (IM) NAIL INTERTROCHANTRIC;  Surgeon: [place], MD;  Location: [place];  Service: Orthopedics;  Laterality: Left;   IR URETERAL STENT LEFT NEW ACCESS W/O SEP NEPHROSTOMY CATH  02/02/2018   NEPHROLITHOTOMY Left 02/02/2018   Procedure: NEPHROLITHOTOMY PERCUTANEOUS;  Surgeon: [name], MD;  Location: [facility];  Service: Urology;  Laterality: Left;   POLYPECTOMY  SCALP LACERATION REPAIR Right 10/16/2017   From fall/staples   SHOULDER ARTHROSCOPY Right 03/05/2018   Procedure: ARTHROSCOPY SHOULDER AND OPEN DISTAL CLAVICLE EXCISION;  Surgeon: [name], MD;  Location: [place];  Service: Orthopedics;  Laterality: Right;   SHOULDER SURGERY     SINUS SURGERY WITH INSTATRAK     TEE WITHOUT CARDIOVERSION N/A 05/09/2017   Procedure: TRANSESOPHAGEAL ECHOCARDIOGRAM (TEE);  Surgeon: [name], MD;  [facility];  Service: Cardiovascular;  Laterality: N/A;   TOOTH EXTRACTION N/A 12/07/2020   Procedure: DENTAL EXTRACTIONS OF TEETH TWENTY TO THIRTY;  Surgeon: [name], DMD;  Location: [place];  Service: Oral Surgery;  Laterality: N/A;   TOOTH EXTRACTION N/A 12/29/2020    Procedure: DENTAL RESTORATION/EXTRACTIONS;  Surgeon: [name], DMD;  Location: [place];  Service: Dentistry;  Laterality: N/A;   TRANSTHORACIC ECHOCARDIOGRAM  02/22/2011   EF 55-65%; increased pattern of LVH with mild conc hypertrophy, abnormal relaxation & increased filling pressure (grade 2 diastolic dysfunction); atrial septum thickened (lipomatous hypertrophy)   UMBILICAL HERNIA REPAIR N/A 07/01/2016   Procedure: UMBILICAL HERNIA REPAIR WITH MESH;  Surgeon: [name], MD;  Location: [place];  Service: General;  Laterality: N/A;   UPPER GASTROINTESTINAL ENDOSCOPY     VASECTOMY     Patient Active Problem List   Diagnosis Date Noted   Protein-calorie malnutrition, severe 12/19/2021   Pathological fracture of left hip due to osteoporosis ([place]) 12/18/2021   Closed nondisplaced intertrochanteric fracture of left femur, initial encounter ([place]) 12/17/2021   Rib fracture 12/17/2021   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 05/11/2021   Liver cirrhosis secondary to NASH with liver mass     GERD (gastroesophageal reflux disease)    Pancytopenia ([facility])    Osteomyelitis of jaw 12/29/2020   Acute osteomyelitis of mandible 12/26/2020   S/P inguinal hernia repair 01/14/2020   At high risk for falls    S/P shoulder surgery 03/05/2018   Excessive daytime sleepiness 02/18/2018   Crohn's disease of colon with rectal bleeding ([place]) 02/18/2018   Iron deficiency anemia due to chronic blood loss and thrombocytopenia  02/18/2018   Iron deficiency anemia due to sideropenic dysphagia 02/18/2018   Renal calculus, left 02/02/2018   History of sepsis 12/16/2017   Renal cyst, acquired, left 07/08/2017   Frequent falls 07/08/2017   Renal lesion 05/28/2017   MSSA bacteremia 05/06/2017   Depression 04/30/2016   Memory loss 04/30/2016   OSA on CPAP 10/31/2015   Nonepileptic episode ([place]) 04/25/2015   Seizure ([place]) 04/04/2013   DOE (dyspnea on exertion) 03/12/2013   Complex partial seizure  ([place]) 04/19/2012   DM2 (diabetes mellitus, type 2) ([place]) 09/25/2011   OTHER DYSPHAGIA 03/27/2009   GERD 12/07/2007   EXTERNAL HEMORRHOIDS 09/11/2007   CROHN'S DISEASE, LARGE AND SMALL INTESTINES 09/11/2007   OSTEOPOROSIS 09/11/2007    PCP: [name], MD   REFERRING PROVIDER: [name], MD   REFERRING DIAG: R29.6 (ICD-10-CM) - Repeated falls S72.009A (ICD-10-CM) - Hip fracture ([place])   THERAPY DIAG:  Other abnormalities of gait and mobility  Muscle weakness (generalized)  Repeated falls  Unspecified lack of coordination  Lack of coordination  Rationale for Evaluation and Treatment Rehabilitation  ONSET DATE: 12/17/2021  SUBJECTIVE:   SUBJECTIVE STATEMENT: Not too bad this AM.   PERTINENT HISTORY: Seizure disorder, chron's, T2DM, depression and anxiety, GERD with hx of esophageal stricture, HTN, HLD, liver cirrhosis secondary to NASH, OSA on cpap, keratinizing SCC of the left cheek currently on  neoadjuvant cemiplimab. Surgical resection on 09/18/21.  PAIN:  Are you having pain? Yes: NPRS scale: 6-8/10 Pain location: right shoulder, left hip Pain description: throbbing Aggravating factors: increased activity Relieving factors: medication  PRECAUTIONS: Fall  WEIGHT BEARING RESTRICTIONS No  FALLS:  Has patient fallen in last 6 months? Yes. Number of falls 1 major fall and several losses of balance  LIVING ENVIRONMENT: Lives with: lives with their spouse Lives in: House/apartment Stairs: Yes: Internal: 14 steps; on right going up and External: 2 steps; on left going up Has following equipment at home: Single point cane, Environmental consultant - 2 wheeled, Wheelchair (manual), Electronics engineer, and Grab bars  OCCUPATION: retired  PLOF: Independent with household mobility with device  PATIENT GOALS:  Decrease risk of falling, improve balance, and get stronger.   OBJECTIVE:   DIAGNOSTIC FINDINGS:  Left hip radiograph on 12/17/21: IMPRESSION: Mildly displaced  intertrochanteric fracture of the left hip  PATIENT SURVEYS:  LEFS TBA  COGNITION:  Overall cognitive status: Within functional limits for tasks assessed     MUSCLE LENGTH: Tightness in bilat hamstrings  POSTURE: rounded shoulders and forward head  PALPATION: Some tenderness to palpation along right shoulder and left hip  LOWER EXTREMITY ROM:  WFL  LOWER EXTREMITY MMT:  03/06/2022: Right shoulder strength of 4/5 Left hip/knee strength of 4/5 Right hip strength of 4+/5, Left knee strength of 5/5  03/08/2022: 3 minute walk test:  499 ft without assistive device with SBA   FUNCTIONAL TESTS:  03/06/2022: 5 times sit to stand: 22.4 sec with UE use .  Pt with increased difficulty on first stand.  GAIT: Distance walked: 100 ft Assistive device utilized: Single point cane Level of assistance: SBA Comments: Pt with antalgic gait with some unsteadiness noted    TODAY'S TREATMENT:    03/15/22: Nustep level 3 x 8 min with PTA present to discuss status LAQ 0# 10x, added 1.5# 10x Bil Seated clamshell blue loop 10x Seated march 1.5# 10x Sit to stand LE 70%/UE 30% 10x Blue loop clamshells 10x Standing at [place]: calf raises 10x, hip abduction 10x, side stepping with sliding of hands 4 lengths,  1# shoulder 3 ways 10x each alternating  03/08/2022: Nustep level 3 x7 min with PT present to discuss status Seated with 2: heel/toe raises, LAQ, marching, hip abduction scissors, hip adduction ball squeeze, hip ER.  BLE 2x10 bilat each Sit to/from holding 5# kettlebell:  x10 with chest press, x10 with overhead press Standing and performing ball toss x20 throws 3 min walk test:  499 ft without assistive device with SBA and occasional veering off path and brushing against walls   03/06/2022:  Reviewed HEP and safety at home with use of [facility]   PATIENT EDUCATION:  Education details: Issued HEP Person educated: Patient and Spouse Education method: Explanation, Demonstration, and  Handouts Education comprehension: verbalized understanding   HOME EXERCISE PROGRAM: Access Code: [email or web address] URL: [email or web address] Date: 03/06/2022 Prepared by: Juel Burrow  Exercises - Seated Long Arc Quad  - 1 x daily - 7 x weekly - 2 sets - 10 reps - Seated March  - 1 x daily - 7 x weekly - 2 sets - 10 reps - Standing March with Counter Support  - 1 x daily - 7 x weekly - 2 sets - 10 reps - Standing Hip Abduction with Counter Support  - 1 x daily - 7 x weekly - 2 sets - 10 reps  ASSESSMENT:  CLINICAL IMPRESSION: Pt is independent and compliant in his  HEP. Today we added some light resistance to his LE . He tolerated this weight very well and could try heavier next session. Standing exercises do require UE support to maintain balance.    OBJECTIVE IMPAIRMENTS decreased balance, decreased coordination, difficulty walking, decreased strength, dizziness, impaired flexibility, postural dysfunction, and pain.   ACTIVITY LIMITATIONS carrying, lifting, bending, squatting, and stairs  PARTICIPATION LIMITATIONS: community activity and yard work  [place], Time since onset of injury/illness/exacerbation, and 3+ comorbidities: Hx of cancer, dizziness, Seizure disorder  are also affecting patient's functional outcome.   REHAB POTENTIAL: Good  CLINICAL DECISION MAKING: Evolving/moderate complexity  EVALUATION COMPLEXITY: Moderate   GOALS: Goals reviewed with patient? Yes  SHORT TERM GOALS: Target date: 03/27/2022  Pt will be independent with initial HEP. Baseline: Goal status: Goal met 03/15/22  2.  Pt will be able to perform sit to/from stand from various surfaces independent with good safety on first attempt and independent. Baseline:  Goal status: INITIAL   LONG TERM GOALS: Target date: 05/01/2022   Pt will be independent with advanced HEP. Baseline:  Goal status: INITIAL  2.  Pt will increase left hip and right shoulder strength to at  least 4+/5 to allow him to perform household tasks with improved ease. Baseline:  Goal status: INITIAL  3.  Pt will decrease 5 times sit to/from stand time to in 15 seconds or less to indicate improved functional mobility. Baseline:  Goal status: INITIAL  4.  Pt will be able to ambulate at least 30 minutes to walk the dog without a loss of balance and without increased hip pain. Baseline:  Goal status: INITIAL  5.  Pt and wife will report improved safety at home with no falls/losses of balance in week prior to discharge. Baseline:  Goal status: INITIAL   PLAN: PT FREQUENCY: 1-2x/week  PT DURATION: 8 weeks  PLANNED INTERVENTIONS: Therapeutic exercises, Therapeutic activity, Neuromuscular re-education, Balance training, Gait training, Patient/Family education, Self Care, Joint mobilization, Joint manipulation, Stair training, Aquatic Therapy, Dry Needling, Electrical stimulation, Spinal manipulation, Spinal mobilization, Cryotherapy, Moist heat, Taping, Ultrasound, Ionotophoresis 76m/ml Dexamethasone, Manual therapy, and Re-evaluation  PLAN FOR NEXT SESSION: assess and progress with HEP as indicated, strengthening, balance   Tyray Proch, PTA 03/15/2022, 11:39 AM   [place] SBonanza HillsGTyrone [place]ner [id number] # [phone] [phone]

## 2022-03-17 ENCOUNTER — Other Ambulatory Visit: Payer: Self-pay

## 2022-03-17 ENCOUNTER — Encounter (HOSPITAL_COMMUNITY): Payer: Self-pay

## 2022-03-17 ENCOUNTER — Emergency Department (HOSPITAL_COMMUNITY): Payer: Medicare Other

## 2022-03-17 ENCOUNTER — Inpatient Hospital Stay (HOSPITAL_COMMUNITY)
Admission: EM | Admit: 2022-03-17 | Discharge: 2022-03-21 | DRG: 871 | Disposition: A | Discharge status: Home / Self Care | Payer: Medicare Other | Attending: Internal Medicine | Admitting: Internal Medicine

## 2022-03-17 DIAGNOSIS — E876 Hypokalemia: Secondary | ICD-10-CM | POA: Diagnosis not present

## 2022-03-17 DIAGNOSIS — N202 Calculus of kidney with calculus of ureter: Secondary | ICD-10-CM | POA: Diagnosis present

## 2022-03-17 DIAGNOSIS — R5381 Other malaise: Secondary | ICD-10-CM

## 2022-03-17 DIAGNOSIS — Z83438 Family history of other disorder of lipoprotein metabolism and other lipidemia: Secondary | ICD-10-CM

## 2022-03-17 DIAGNOSIS — G8929 Other chronic pain: Secondary | ICD-10-CM | POA: Diagnosis present

## 2022-03-17 DIAGNOSIS — E785 Hyperlipidemia, unspecified: Secondary | ICD-10-CM | POA: Diagnosis present

## 2022-03-17 DIAGNOSIS — E119 Type 2 diabetes mellitus without complications: Secondary | ICD-10-CM | POA: Diagnosis present

## 2022-03-17 DIAGNOSIS — R634 Abnormal weight loss: Secondary | ICD-10-CM | POA: Diagnosis present

## 2022-03-17 DIAGNOSIS — S2241XA Multiple fractures of ribs, right side, initial encounter for closed fracture: Secondary | ICD-10-CM | POA: Diagnosis present

## 2022-03-17 DIAGNOSIS — Z833 Family history of diabetes mellitus: Secondary | ICD-10-CM

## 2022-03-17 DIAGNOSIS — F321 Major depressive disorder, single episode, moderate: Secondary | ICD-10-CM | POA: Diagnosis not present

## 2022-03-17 DIAGNOSIS — G4733 Obstructive sleep apnea (adult) (pediatric): Secondary | ICD-10-CM | POA: Diagnosis present

## 2022-03-17 DIAGNOSIS — N1330 Unspecified hydronephrosis: Secondary | ICD-10-CM | POA: Diagnosis not present

## 2022-03-17 DIAGNOSIS — Z9841 Cataract extraction status, right eye: Secondary | ICD-10-CM

## 2022-03-17 DIAGNOSIS — K7682 Hepatic encephalopathy: Secondary | ICD-10-CM | POA: Diagnosis present

## 2022-03-17 DIAGNOSIS — R509 Fever, unspecified: Secondary | ICD-10-CM | POA: Diagnosis not present

## 2022-03-17 DIAGNOSIS — N390 Urinary tract infection, site not specified: Secondary | ICD-10-CM | POA: Diagnosis not present

## 2022-03-17 DIAGNOSIS — D696 Thrombocytopenia, unspecified: Secondary | ICD-10-CM | POA: Diagnosis not present

## 2022-03-17 DIAGNOSIS — D649 Anemia, unspecified: Secondary | ICD-10-CM | POA: Diagnosis present

## 2022-03-17 DIAGNOSIS — S22050A Wedge compression fracture of T5-T6 vertebra, initial encounter for closed fracture: Secondary | ICD-10-CM | POA: Diagnosis not present

## 2022-03-17 DIAGNOSIS — Z20822 Contact with and (suspected) exposure to covid-19: Secondary | ICD-10-CM | POA: Diagnosis present

## 2022-03-17 DIAGNOSIS — N136 Pyonephrosis: Secondary | ICD-10-CM | POA: Diagnosis present

## 2022-03-17 DIAGNOSIS — M47812 Spondylosis without myelopathy or radiculopathy, cervical region: Secondary | ICD-10-CM | POA: Diagnosis not present

## 2022-03-17 DIAGNOSIS — K219 Gastro-esophageal reflux disease without esophagitis: Secondary | ICD-10-CM | POA: Diagnosis not present

## 2022-03-17 DIAGNOSIS — K7460 Unspecified cirrhosis of liver: Secondary | ICD-10-CM | POA: Diagnosis present

## 2022-03-17 DIAGNOSIS — A419 Sepsis, unspecified organism: Secondary | ICD-10-CM | POA: Diagnosis present

## 2022-03-17 DIAGNOSIS — K7581 Nonalcoholic steatohepatitis (NASH): Secondary | ICD-10-CM | POA: Diagnosis present

## 2022-03-17 DIAGNOSIS — G40209 Localization-related (focal) (partial) symptomatic epilepsy and epileptic syndromes with complex partial seizures, not intractable, without status epilepticus: Secondary | ICD-10-CM | POA: Diagnosis present

## 2022-03-17 DIAGNOSIS — E43 Unspecified severe protein-calorie malnutrition: Secondary | ICD-10-CM | POA: Diagnosis present

## 2022-03-17 DIAGNOSIS — Z882 Allergy status to sulfonamides status: Secondary | ICD-10-CM

## 2022-03-17 DIAGNOSIS — F32A Depression, unspecified: Secondary | ICD-10-CM | POA: Diagnosis present

## 2022-03-17 DIAGNOSIS — M549 Dorsalgia, unspecified: Secondary | ICD-10-CM | POA: Diagnosis not present

## 2022-03-17 DIAGNOSIS — K766 Portal hypertension: Secondary | ICD-10-CM | POA: Diagnosis present

## 2022-03-17 DIAGNOSIS — Z7902 Long term (current) use of antithrombotics/antiplatelets: Secondary | ICD-10-CM

## 2022-03-17 DIAGNOSIS — R000 Tachycardia, unspecified: Secondary | ICD-10-CM | POA: Diagnosis not present

## 2022-03-17 DIAGNOSIS — Z823 Family history of stroke: Secondary | ICD-10-CM

## 2022-03-17 DIAGNOSIS — Z79899 Other long term (current) drug therapy: Secondary | ICD-10-CM | POA: Diagnosis not present

## 2022-03-17 DIAGNOSIS — F418 Other specified anxiety disorders: Secondary | ICD-10-CM | POA: Diagnosis not present

## 2022-03-17 DIAGNOSIS — C719 Malignant neoplasm of brain, unspecified: Secondary | ICD-10-CM | POA: Diagnosis not present

## 2022-03-17 DIAGNOSIS — I10 Essential (primary) hypertension: Secondary | ICD-10-CM | POA: Diagnosis present

## 2022-03-17 DIAGNOSIS — N200 Calculus of kidney: Secondary | ICD-10-CM | POA: Diagnosis not present

## 2022-03-17 DIAGNOSIS — N210 Calculus in bladder: Secondary | ICD-10-CM | POA: Diagnosis not present

## 2022-03-17 DIAGNOSIS — N130 Hydronephrosis with ureteropelvic junction obstruction: Secondary | ICD-10-CM | POA: Diagnosis not present

## 2022-03-17 DIAGNOSIS — Y929 Unspecified place or not applicable: Secondary | ICD-10-CM | POA: Diagnosis not present

## 2022-03-17 DIAGNOSIS — N201 Calculus of ureter: Secondary | ICD-10-CM | POA: Diagnosis not present

## 2022-03-17 DIAGNOSIS — I700 Atherosclerosis of aorta: Secondary | ICD-10-CM | POA: Diagnosis not present

## 2022-03-17 DIAGNOSIS — G9340 Encephalopathy, unspecified: Secondary | ICD-10-CM | POA: Diagnosis not present

## 2022-03-17 DIAGNOSIS — R162 Hepatomegaly with splenomegaly, not elsewhere classified: Secondary | ICD-10-CM | POA: Diagnosis present

## 2022-03-17 DIAGNOSIS — K828 Other specified diseases of gallbladder: Secondary | ICD-10-CM | POA: Diagnosis not present

## 2022-03-17 DIAGNOSIS — S2243XA Multiple fractures of ribs, bilateral, initial encounter for closed fracture: Secondary | ICD-10-CM | POA: Diagnosis not present

## 2022-03-17 DIAGNOSIS — K5080 Crohn's disease of both small and large intestine without complications: Secondary | ICD-10-CM | POA: Diagnosis present

## 2022-03-17 DIAGNOSIS — N281 Cyst of kidney, acquired: Secondary | ICD-10-CM | POA: Diagnosis not present

## 2022-03-17 DIAGNOSIS — R296 Repeated falls: Secondary | ICD-10-CM

## 2022-03-17 DIAGNOSIS — W19XXXA Unspecified fall, initial encounter: Secondary | ICD-10-CM | POA: Diagnosis present

## 2022-03-17 DIAGNOSIS — Z888 Allergy status to other drugs, medicaments and biological substances status: Secondary | ICD-10-CM

## 2022-03-17 DIAGNOSIS — C44329 Squamous cell carcinoma of skin of other parts of face: Secondary | ICD-10-CM | POA: Diagnosis present

## 2022-03-17 DIAGNOSIS — Z9842 Cataract extraction status, left eye: Secondary | ICD-10-CM

## 2022-03-17 DIAGNOSIS — G9341 Metabolic encephalopathy: Secondary | ICD-10-CM | POA: Diagnosis present

## 2022-03-17 DIAGNOSIS — Z8249 Family history of ischemic heart disease and other diseases of the circulatory system: Secondary | ICD-10-CM

## 2022-03-17 DIAGNOSIS — H409 Unspecified glaucoma: Secondary | ICD-10-CM | POA: Diagnosis present

## 2022-03-17 DIAGNOSIS — Z8614 Personal history of Methicillin resistant Staphylococcus aureus infection: Secondary | ICD-10-CM

## 2022-03-17 DIAGNOSIS — C229 Malignant neoplasm of liver, not specified as primary or secondary: Secondary | ICD-10-CM | POA: Diagnosis present

## 2022-03-17 DIAGNOSIS — S2242XD Multiple fractures of ribs, left side, subsequent encounter for fracture with routine healing: Secondary | ICD-10-CM | POA: Diagnosis not present

## 2022-03-17 DIAGNOSIS — Z85828 Personal history of other malignant neoplasm of skin: Secondary | ICD-10-CM | POA: Diagnosis not present

## 2022-03-17 DIAGNOSIS — Z6821 Body mass index (BMI) 21.0-21.9, adult: Secondary | ICD-10-CM

## 2022-03-17 DIAGNOSIS — F419 Anxiety disorder, unspecified: Secondary | ICD-10-CM | POA: Diagnosis present

## 2022-03-17 DIAGNOSIS — F4310 Post-traumatic stress disorder, unspecified: Secondary | ICD-10-CM | POA: Diagnosis present

## 2022-03-17 LAB — URINALYSIS, ROUTINE W REFLEX MICROSCOPIC
Bilirubin Urine: NEGATIVE
Glucose, UA: NEGATIVE mg/dL
Ketones, ur: NEGATIVE mg/dL
Nitrite: POSITIVE — AB
Protein, ur: NEGATIVE mg/dL
Specific Gravity, Urine: 1.013 (ref 1.005–1.030)
pH: 6 (ref 5.0–8.0)

## 2022-03-17 LAB — COMPREHENSIVE METABOLIC PANEL
ALT: 27 U/L (ref 0–44)
AST: 44 U/L — ABNORMAL HIGH (ref 15–41)
Albumin: 3.4 g/dL — ABNORMAL LOW (ref 3.5–5.0)
Alkaline Phosphatase: 328 U/L — ABNORMAL HIGH (ref 38–126)
Anion gap: 8 (ref 5–15)
BUN: 10 mg/dL (ref 6–20)
CO2: 23 mmol/L (ref 22–32)
Calcium: 8.9 mg/dL (ref 8.9–10.3)
Chloride: 108 mmol/L (ref 98–111)
Creatinine, Ser: 0.75 mg/dL (ref 0.61–1.24)
GFR, Estimated: >60 mL/min (ref >60)
Glucose, Bld: 103 mg/dL — ABNORMAL HIGH (ref 70–99)
Potassium: 3.9 mmol/L (ref 3.5–5.1)
Sodium: 139 mmol/L (ref 135–145)
Total Bilirubin: 1.4 mg/dL — ABNORMAL HIGH (ref 0.3–1.2)
Total Protein: 6.4 g/dL — ABNORMAL LOW (ref 6.5–8.1)

## 2022-03-17 LAB — CBC WITH DIFFERENTIAL/PLATELET
Abs Immature Granulocytes: 0.1 10*3/uL — ABNORMAL HIGH (ref 0.00–0.07)
Basophils Absolute: 0 10*3/uL (ref 0.0–0.1)
Basophils Relative: 0 %
Eosinophils Absolute: 0 10*3/uL (ref 0.0–0.5)
Eosinophils Relative: 0 %
HCT: 34.2 % — ABNORMAL LOW (ref 39.0–52.0)
Hemoglobin: 11.4 g/dL — ABNORMAL LOW (ref 13.0–17.0)
Immature Granulocytes: 1 %
Lymphocytes Relative: 2 %
Lymphs Abs: 0.3 10*3/uL — ABNORMAL LOW (ref 0.7–4.0)
MCH: 30.6 pg (ref 26.0–34.0)
MCHC: 33.3 g/dL (ref 30.0–36.0)
MCV: 91.7 fL (ref 80.0–100.0)
Monocytes Absolute: 0.7 10*3/uL (ref 0.1–1.0)
Monocytes Relative: 6 %
Neutro Abs: 11.6 10*3/uL — ABNORMAL HIGH (ref 1.7–7.7)
Neutrophils Relative %: 91 %
Platelets: 109 10*3/uL — ABNORMAL LOW (ref 150–400)
RBC: 3.73 MIL/uL — ABNORMAL LOW (ref 4.22–5.81)
RDW: 15 % (ref 11.5–15.5)
WBC: 12.7 10*3/uL — ABNORMAL HIGH (ref 4.0–10.5)
nRBC: 0 % (ref 0.0–0.2)

## 2022-03-17 LAB — BLOOD GAS, VENOUS
Acid-Base Excess: 0.3 mmol/L (ref 0.0–2.0)
Bicarbonate: 23.3 mmol/L (ref 20.0–28.0)
O2 Saturation: 90 %
Patient temperature: 37
pCO2, Ven: 32 mmHg — ABNORMAL LOW (ref 44–60)
pH, Ven: 7.47 — ABNORMAL HIGH (ref 7.25–7.43)
pO2, Ven: 58 mmHg — ABNORMAL HIGH (ref 32–45)

## 2022-03-17 LAB — CBG MONITORING, ED: Glucose-Capillary: 108 mg/dL — ABNORMAL HIGH (ref 70–99)

## 2022-03-17 LAB — RESP PANEL BY RT-PCR (FLU A&B, COVID) ARPGX2
Influenza A by PCR: NEGATIVE
Influenza B by PCR: NEGATIVE
SARS Coronavirus 2 by RT PCR: NEGATIVE

## 2022-03-17 LAB — LACTIC ACID, PLASMA
Lactic Acid, Venous: 2.4 mmol/L (ref 0.5–1.9)
Lactic Acid, Venous: 2.9 mmol/L (ref 0.5–1.9)
Lactic Acid, Venous: 3.2 mmol/L (ref 0.5–1.9)
Lactic Acid, Venous: 2.8 mmol/L (ref 0.5–1.9)

## 2022-03-17 LAB — GLUCOSE, CAPILLARY: Glucose-Capillary: 94 mg/dL (ref 70–99)

## 2022-03-17 LAB — AMMONIA: Ammonia: 71 umol/L — ABNORMAL HIGH (ref 9–35)

## 2022-03-17 MED ORDER — TAMSULOSIN HCL 0.4 MG PO CAPS
0.4000 mg | ORAL_CAPSULE | Freq: Every day | ORAL | Status: DC
  Administered 2022-03-18 – 2022-03-20 (×3): 0.4 mg via ORAL
  Filled 2022-03-17 (×3): qty 1

## 2022-03-17 MED ORDER — SODIUM CHLORIDE (PF) 0.9 % IJ SOLN
INTRAMUSCULAR | Status: AC
  Filled 2022-03-17: qty 50

## 2022-03-17 MED ORDER — TAMSULOSIN HCL 0.4 MG PO CAPS
0.4000 mg | ORAL_CAPSULE | Freq: Once | ORAL | Status: AC
  Administered 2022-03-17: 0.4 mg via ORAL
  Filled 2022-03-17: qty 1

## 2022-03-17 MED ORDER — IOHEXOL 300 MG/ML  SOLN
100.0000 mL | Freq: Once | INTRAMUSCULAR | Status: AC | PRN
  Administered 2022-03-17: 100 mL via INTRAVENOUS

## 2022-03-17 MED ORDER — LACTATED RINGERS IV BOLUS
1000.0000 mL | Freq: Once | INTRAVENOUS | Status: AC
  Administered 2022-03-17: 1000 mL via INTRAVENOUS

## 2022-03-17 MED ORDER — LATANOPROST 0.005 % OP SOLN
1.0000 [drp] | Freq: Every day | OPHTHALMIC | Status: DC
  Administered 2022-03-17 – 2022-03-20 (×4): 1 [drp] via OPHTHALMIC
  Filled 2022-03-17: qty 2.5

## 2022-03-17 MED ORDER — TRAZODONE HCL 50 MG PO TABS
150.0000 mg | ORAL_TABLET | Freq: Every day | ORAL | Status: DC
  Administered 2022-03-17 – 2022-03-20 (×4): 150 mg via ORAL
  Filled 2022-03-17 (×4): qty 3

## 2022-03-17 MED ORDER — ACETAMINOPHEN 650 MG RE SUPP: 650.0000 mg | Freq: Four times a day (QID) | RECTAL | Status: DC | PRN

## 2022-03-17 MED ORDER — ONDANSETRON HCL 4 MG PO TABS: 4.0000 mg | ORAL_TABLET | Freq: Four times a day (QID) | ORAL | Status: DC | PRN

## 2022-03-17 MED ORDER — PANTOPRAZOLE SODIUM 40 MG PO TBEC
40.0000 mg | DELAYED_RELEASE_TABLET | Freq: Two times a day (BID) | ORAL | Status: DC
  Administered 2022-03-17 – 2022-03-21 (×8): 40 mg via ORAL
  Filled 2022-03-17 (×8): qty 1

## 2022-03-17 MED ORDER — ACETAMINOPHEN 325 MG PO TABS
650.0000 mg | ORAL_TABLET | Freq: Four times a day (QID) | ORAL | Status: DC | PRN
  Administered 2022-03-17: 650 mg via ORAL
  Filled 2022-03-17: qty 2

## 2022-03-17 MED ORDER — OXYCODONE HCL 5 MG PO TABS
5.0000 mg | ORAL_TABLET | ORAL | Status: DC | PRN
  Administered 2022-03-17 – 2022-03-20 (×8): 5 mg via ORAL
  Filled 2022-03-17 (×8): qty 1

## 2022-03-17 MED ORDER — MESALAMINE 1.2 G PO TBEC
2.4000 g | DELAYED_RELEASE_TABLET | Freq: Every day | ORAL | Status: DC
  Administered 2022-03-18 – 2022-03-21 (×4): 2.4 g via ORAL
  Filled 2022-03-17 (×4): qty 2

## 2022-03-17 MED ORDER — VANCOMYCIN HCL 1250 MG/250ML IV SOLN
1250.0000 mg | Freq: Two times a day (BID) | INTRAVENOUS | Status: DC
  Administered 2022-03-17: 1250 mg via INTRAVENOUS
  Filled 2022-03-17 (×2): qty 250

## 2022-03-17 MED ORDER — LACTULOSE 10 GM/15ML PO SOLN: 10.0000 g | Freq: Once | ORAL | Status: DC

## 2022-03-17 MED ORDER — TAMSULOSIN HCL 0.4 MG PO CAPS: 0.4000 mg | ORAL_CAPSULE | Freq: Every day | ORAL | Status: DC

## 2022-03-17 MED ORDER — KETOROLAC TROMETHAMINE 15 MG/ML IJ SOLN
15.0000 mg | Freq: Once | INTRAMUSCULAR | Status: AC
  Administered 2022-03-17: 15 mg via INTRAVENOUS
  Filled 2022-03-17: qty 1

## 2022-03-17 MED ORDER — MORPHINE SULFATE (PF) 4 MG/ML IV SOLN
4.0000 mg | Freq: Once | INTRAVENOUS | Status: AC
  Administered 2022-03-17: 4 mg via INTRAVENOUS
  Filled 2022-03-17: qty 1

## 2022-03-17 MED ORDER — RIFAXIMIN 550 MG PO TABS
550.0000 mg | ORAL_TABLET | Freq: Two times a day (BID) | ORAL | Status: DC
  Administered 2022-03-17 – 2022-03-21 (×8): 550 mg via ORAL
  Filled 2022-03-17 (×8): qty 1

## 2022-03-17 MED ORDER — SERTRALINE HCL 100 MG PO TABS
200.0000 mg | ORAL_TABLET | Freq: Every day | ORAL | Status: DC
  Administered 2022-03-18 – 2022-03-21 (×4): 200 mg via ORAL
  Filled 2022-03-17 (×4): qty 2

## 2022-03-17 MED ORDER — ENSURE ENLIVE PO LIQD
237.0000 mL | Freq: Two times a day (BID) | ORAL | Status: DC
  Administered 2022-03-19 – 2022-03-21 (×6): 237 mL via ORAL

## 2022-03-17 MED ORDER — LACTATED RINGERS IV SOLN: INTRAVENOUS | Status: DC

## 2022-03-17 MED ORDER — ONDANSETRON HCL 4 MG/2ML IJ SOLN: 4.0000 mg | Freq: Four times a day (QID) | INTRAMUSCULAR | Status: DC | PRN

## 2022-03-17 MED ORDER — RIFAXIMIN 550 MG PO TABS
550.0000 mg | ORAL_TABLET | Freq: Two times a day (BID) | ORAL | Status: DC
  Filled 2022-03-17: qty 1

## 2022-03-17 MED ORDER — LACTATED RINGERS IV BOLUS
1160.0000 mL | Freq: Once | INTRAVENOUS | Status: AC
  Administered 2022-03-17: 1160 mL via INTRAVENOUS

## 2022-03-17 MED ORDER — SODIUM CHLORIDE 0.9 % IV SOLN
1.0000 g | INTRAVENOUS | Status: DC
  Administered 2022-03-17 – 2022-03-20 (×4): 1 g via INTRAVENOUS
  Filled 2022-03-17 (×5): qty 10

## 2022-03-17 MED ORDER — SODIUM CHLORIDE 0.9 % IV SOLN
1.0000 g | Freq: Once | INTRAVENOUS | Status: AC
  Administered 2022-03-17: 1 g via INTRAVENOUS
  Filled 2022-03-17: qty 10

## 2022-03-17 MED ORDER — LACTASE 3000 UNITS PO TABS
3000.0000 [IU] | ORAL_TABLET | Freq: Every day | ORAL | Status: DC
  Administered 2022-03-17 – 2022-03-20 (×4): 3000 [IU] via ORAL
  Filled 2022-03-17 (×5): qty 1

## 2022-03-17 MED ORDER — LAMOTRIGINE 25 MG PO TABS
150.0000 mg | ORAL_TABLET | Freq: Two times a day (BID) | ORAL | Status: DC
  Administered 2022-03-17 – 2022-03-21 (×8): 150 mg via ORAL
  Filled 2022-03-17 (×8): qty 2

## 2022-03-17 MED ORDER — VANCOMYCIN HCL 1500 MG/300ML IV SOLN
1500.0000 mg | Freq: Once | INTRAVENOUS | Status: AC
  Administered 2022-03-17: 1500 mg via INTRAVENOUS
  Filled 2022-03-17: qty 300

## 2022-03-17 MED ORDER — ACETAMINOPHEN 500 MG PO TABS
1000.0000 mg | ORAL_TABLET | Freq: Once | ORAL | Status: AC
  Administered 2022-03-17: 1000 mg via ORAL
  Filled 2022-03-17: qty 2

## 2022-03-17 NOTE — Progress Notes (Signed)
Pharmacy Antibiotic Note  [name] [name] is a 61 y.o. male admitted on 03/17/2022 with sepsis.  Pharmacy has been consulted for Vanco dosing.  Active Problem(s):  AMS, UTI and kidney stone  - currently on chemotherapy for liver cancer.  He has also had history of MRSA from a jaw osteomyelitis   PMH:  nephrolithiasis, anxiety, arthritis, seizures, Chrohn's, Depression, DM, esophageal stricture, hemorrhoids, GERD, glaucoma, HTN, HLD, BL inguinal hernia, NASH cirrhosis, hepatocellular carcinoma , migraines, OP, PTSD, renal cyst, skin cancer, OSA, MRSA bacteremia 2018.  ID: Sepsis with UTI, h/o MRSA, Immunocompromised from chemo.  - Tmax 101.8, WBC 12.7, Scr <1, Lactic acid elevated  Rocephin 9/24>> Vanco 9/24>>  Plan: Vanco 1514m IV x 1 Vancomycin 1250 mg IV Q 12 hrs. Goal AUC 400-550. Expected AUC: 487 SCr used: 0.8  Height: 5' 11"  (180.3 cm) Weight: 70.3 kg (155 lb) IBW/kg (Calculated) : 75.3  Temp (24hrs), Avg:100.5 F (38.1 C), Min:98.4 F (36.9 C), Max:101.8 F (38.8 C)  Recent Labs  Lab 03/17/22 0833 03/17/22 0904  WBC  --  12.7*  CREATININE 0.75  --   LATICACIDVEN 2.9*  2.4*  --     Estimated Creatinine Clearance: 97.6 mL/min (by C-G formula based on SCr of 0.75 mg/dL).    Allergies  Allergen Reactions   Sulfonamide Derivatives Hives   Z-Pak [Azithromycin] Swelling   Dilaudid [Hydromorphone Hcl] Nausea And Vomiting   Sulfa Antibiotics Hives and Rash    Osiel Stick S. RAlford Highland PharmD, BCPS Clinical Staff Pharmacist [email or web address]  RWayland Salinas9/24/2023 2:56 PM

## 2022-03-17 NOTE — ED Triage Notes (Signed)
Pt arrived via EMS, from home, c/o urinary frequency, worsening confusion.

## 2022-03-17 NOTE — Progress Notes (Signed)
A consult was received from an ED physician for vancomycin per pharmacy dosing.  The patient's profile has been reviewed for ht/wt/allergies/indication/available labs.    A one time order has been placed for vancomycin 1500 mg IV.    Further antibiotics/pharmacy consults should be ordered by admitting physician if indicated.                       Thank you for allowing pharmacy to be a part of this patient's care.  Royetta Asal, PharmD, BCPS Clinical Pharmacist [place] Please utilize Amion for appropriate phone number to reach the unit pharmacist ([place]) 03/17/2022 9:39 AM

## 2022-03-17 NOTE — Consult Note (Signed)
Urology Consult   Physician requesting consult: Nechama Guard  Reason for consult: UTI and kidney stone  History of Present Illness: [name] [name] is a 61 y.o. well-known to me previously seen with history of nephrolithiasis.  He presents today with 2 to 3-day history of becoming progressively ill and becoming confused.  Also recent fever at home.  Was having some dysuria and urgency.  He has chronic back pain but may have had more right-sided back pain recently.  Evaluation has had a CT scan which suggested possible 2 to 3 mm faintly calcified stone at the right UVJ with very mild hydronephrosis.  Occult to tell for sure whether this is a stone or not.  Did not appear to be on prior CT scan for comparison.  Patient has significant large nonobstructing left renal calculi as well.  And shows evidence of UTI  He denies a history of voiding or storage urinary symptoms, hematuria, UTIs, STDs, urolithiasis, GU malignancy/trauma/surgery.  Past Medical History:  Diagnosis Date   Adenomatous colon polyp 10/1999   Anxiety    Arthritis    neck, yoga helps.   Cataract    Complex partial seizure ([place])    last seizure was 08-30-2018   Crohn's disease of small and large intestines ([place]) 1999   Depression    Diabetes mellitus    no meds at this time 10-24-17   Esophageal stricture    External hemorrhoids    GERD (gastroesophageal reflux disease)    Glaucoma    History of kidney stones    3 large stones still present   Hyperlipemia    Hypertension    resolved with weight loss    Inguinal hernia, bilateral 12/2019   Liver cirrhosis secondary to NASH [facility])    Migraines    Osteoporosis    Pathological fracture of left hip due to osteoporosis ([place]) 12/18/2021   PONV (postoperative nausea and vomiting)    PTSD (post-traumatic stress disorder)    Renal cyst, acquired, left 07/08/2017   Skin cancer    squamous cell multiple; [place]; followed every 3 months.   Sleep apnea    CPAP machine, uses nightly    Staphylococcus aureus bacteremia with sepsis ([place]) 05/28/2017   MRSA 2012    Past Surgical History:  Procedure Laterality Date   CATARACT EXTRACTION Bilateral    COLONOSCOPY     DEBRIDEMENT MANDIBLE N/A 12/07/2020   Procedure: DEBRIDEMENT OF MANDIBLE;  Surgeon: [name], DMD;  Location: [place];  Service: Oral Surgery;  Laterality: N/A;   ELBOW SURGERY  2012   elbow MRSA infection    EYE SURGERY     cataracts removed, /w IOL   GROSS TEETH DEBRIDEMENT N/A 12/29/2020   Procedure: MANDIBULAR  DEBRIDEMENT;  Surgeon: [name], DMD;  Location: [place];  Service: Dentistry;  Laterality: N/A;   INCISION AND DRAINAGE ABSCESS N/A 12/07/2020   Procedure: INCISION AND DRAINAGE MANDIBULAR ABSCESS;  Surgeon: [name], DMD;  Location: [place];  Service: Oral Surgery;  Laterality: N/A;   INGUINAL HERNIA REPAIR Right 01/14/2020   Procedure: OPEN RIGHT HERNIA REPAIR INGUINAL WITH MESH;  Surgeon: [name], MD;  Location: [facility];  Service: General;  Laterality: Right;   INSERTION OF MESH N/A 07/01/2016   Procedure: INSERTION OF MESH;  Surgeon: [name], MD;  Location: [place];  Service: General;  Laterality: N/A;   INTRAMEDULLARY (IM) NAIL INTERTROCHANTERIC Left 12/17/2021   Procedure: INTRAMEDULLARY (IM) NAIL INTERTROCHANTRIC;  Surgeon: Altamese Free Soil, MD;  Location: [place];  Service: Orthopedics;  Laterality: Left;   IR URETERAL STENT LEFT NEW ACCESS W/O SEP NEPHROSTOMY CATH  02/02/2018   NEPHROLITHOTOMY Left 02/02/2018   Procedure: NEPHROLITHOTOMY PERCUTANEOUS;  Surgeon: [name], MD;  Location: [facility];  Service: Urology;  Laterality: Left;   POLYPECTOMY     SCALP LACERATION REPAIR Right 10/16/2017   From fall/staples   SHOULDER ARTHROSCOPY Right 03/05/2018   Procedure: ARTHROSCOPY SHOULDER AND OPEN DISTAL CLAVICLE EXCISION;  Surgeon: [name], MD;  Location: [place];  Service: Orthopedics;  Laterality: Right;   SHOULDER SURGERY     SINUS SURGERY WITH INSTATRAK      TEE WITHOUT CARDIOVERSION N/A 05/09/2017   Procedure: TRANSESOPHAGEAL ECHOCARDIOGRAM (TEE);  Surgeon: [name], MD;  Location: [facility];  Service: Cardiovascular;  Laterality: N/A;   TOOTH EXTRACTION N/A 12/07/2020   Procedure: DENTAL EXTRACTIONS OF TEETH TWENTY TO THIRTY;  Surgeon: [name], DMD;  Location: [place];  Service: Oral Surgery;  Laterality: N/A;   TOOTH EXTRACTION N/A 12/29/2020   Procedure: DENTAL RESTORATION/EXTRACTIONS;  Surgeon: [name], DMD;  Location: [place];  Service: Dentistry;  Laterality: N/A;   TRANSTHORACIC ECHOCARDIOGRAM  02/22/2011   EF 55-65%; increased pattern of LVH with mild conc hypertrophy, abnormal relaxation & increased filling pressure (grade 2 diastolic dysfunction); atrial septum thickened (lipomatous hypertrophy)   UMBILICAL HERNIA REPAIR N/A 07/01/2016   Procedure: UMBILICAL HERNIA REPAIR WITH MESH;  Surgeon: [name], MD;  Location: [place];  Service: General;  Laterality: N/A;   [facility] Medications:  Home Meds:  Current Facility-Administered Medications on File Prior to Encounter  Medication Dose Route Frequency Provider Last Rate Last Admin   gadopentetate dimeglumine (MAGNEVIST) injection 20 mL  20 mL Intravenous Once PRN Marcial Pacas, MD       Current Outpatient Medications on File Prior to Encounter  Medication Sig Dispense Refill   acetaminophen (TYLENOL) 500 MG tablet Take 2 tablets (1,000 mg total) by mouth 3 (three) times daily. 30 tablet 0   acitretin ([name]) 25 MG capsule Take 25 mg by mouth daily.     apixaban (ELIQUIS) 2.5 MG TABS tablet Take 1 tablet (2.5 mg total) by mouth 2 (two) times daily. (Patient not taking: Reported on 03/06/2022) 20 tablet 0   CALCIUM PO Take 1 tablet by mouth daily.     cemiplimab-rwlc (LIBTAYO) 350 MG/7ML SOLN injection Inject into the vein every 6 (six) weeks.     Cholecalciferol (VITAMIN D-3 PO) Take 1 capsule by  mouth daily.     cycloSPORINE (RESTASIS) 0.05 % ophthalmic emulsion Place 1 drop into both eyes 2 (two) times daily as needed (dry eyes).     desonide (DESOWEN) 0.05 % ointment Apply 1 Application topically 3 (three) times daily. Mix with Mupirocin 2%. To lower lip.     diclofenac Sodium (VOLTAREN) 1 % GEL Apply 2 g topically 4 (four) times daily. 150 g 0   docusate sodium (COLACE) 100 MG capsule Take 1 capsule (100 mg total) by mouth 2 (two) times daily. (Patient not taking: Reported on 03/06/2022) 10 capsule 0   esomeprazole (NEXIUM) 40 MG capsule Take 1 capsule (40 mg total) by mouth 2 (two) times daily before a meal. 180 capsule 3   Eszopiclone 3 MG TABS Take 3 mg by mouth at bedtime as needed (sleep).     feeding supplement (ENSURE ENLIVE / ENSURE PLUS) LIQD Take 237  mLs by mouth 2 (two) times daily between meals. 623 mL 12   folic acid (FOLVITE) 1 MG tablet Take 2 tablets (2 mg total) by mouth daily. 180 tablet 3   hydrocortisone 2.5 % cream Apply 1 Application topically 2 (two) times daily as needed (Itchy skin).     Lactase (DAIRY RELIEF PO) Take 1 tablet by mouth in the morning and at bedtime.     lamoTRIgine (LAMICTAL) 150 MG tablet Take 1 tablet (150 mg total) by mouth 2 (two) times daily. 180 tablet 3   latanoprost (XALATAN) 0.005 % ophthalmic solution Place 1 drop into both eyes at bedtime.      lidocaine (LIDODERM) 5 % Place 1 patch onto the skin daily. Remove & Discard patch within 12 hours or as directed by MD (Patient not taking: Reported on 03/06/2022) 30 patch 0   mesalamine (LIALDA) 1.2 g EC tablet Take 2 tablets (2.4 g total) by mouth 2 (two) times daily. (Patient taking differently: Take 2.4 g by mouth daily.) 360 tablet 1   methocarbamol (ROBAXIN) 750 MG tablet Take 1 tablet (750 mg total) by mouth every 8 (eight) hours as needed for muscle spasms. (Patient not taking: Reported on 03/06/2022) 30 tablet 0   methscopolamine (PAMINE FORTE) 5 MG tablet Take 1 tablet (5 mg total) by  mouth 2 (two) times daily. 180 tablet 1   mupirocin ointment (BACTROBAN) 2 % Apply 1 Application topically 3 (three) times daily. Mix with Desonide 0.05%. To lower lip.     Oxycodone HCl 10 MG TABS Take 1-1.5 tablets (10-15 mg total) by mouth every 4 (four) hours as needed. 30 tablet 0   potassium chloride SA (KLOR-CON) 20 MEQ tablet Take 1 tablet (20 mEq total) by mouth 2 (two) times daily. 180 tablet 3   Probiotic Product (PROBIOTIC PO) Take 1 tablet at bedtime by mouth.      rifaximin (XIFAXAN) 550 MG TABS tablet Take 1 tablet (550 mg total) by mouth 2 (two) times daily. 180 tablet 3   sertraline (ZOLOFT) 100 MG tablet Take 200 mg daily with breakfast by mouth.      tamsulosin (FLOMAX) 0.4 MG CAPS capsule Take 0.4 mg by mouth at bedtime. (Patient not taking: Reported on 03/06/2022)     tolterodine (DETROL LA) 4 MG 24 hr capsule Take 4 mg by mouth daily.     traZODone (DESYREL) 150 MG tablet Take 150 mg by mouth at bedtime.       Scheduled Meds:  ketorolac  15 mg Intravenous Once   rifaximin  550 mg Oral BID   sodium chloride (PF)       tamsulosin  0.4 mg Oral Once   Continuous Infusions:  cefTRIAXone (ROCEPHIN)  IV Stopped (03/17/22 0957)   vancomycin 1,500 mg (03/17/22 1210)   PRN Meds:.acetaminophen **OR** acetaminophen, ondansetron **OR** ondansetron (ZOFRAN) IV, sodium chloride (PF)  Allergies:  Allergies  Allergen Reactions   Sulfonamide Derivatives Hives   Z-Pak [Azithromycin] Swelling   Dilaudid [Hydromorphone Hcl] Nausea And Vomiting   Sulfa Antibiotics Hives and Rash    Family History  Problem Relation Age of Onset   Heart disease Mother        CABG at age 36   Hyperlipidemia Mother    Hypertension Mother    Diabetes Father    Colon polyps Father    Stroke Father    Hyperlipidemia Father    Hypertension Father    Stroke Paternal Grandmother    Stroke Paternal Grandfather  Other Child        tetrology of fallot (cornealia deland syndrome)   Colon cancer  Neg Hx    Esophageal cancer Neg Hx    Stomach cancer Neg Hx    Rectal cancer Neg Hx     Social History:  reports that he has never smoked. He has never been exposed to tobacco smoke. He has never used smokeless tobacco. He reports that he does not drink alcohol and does not use drugs.  ROS: A complete review of systems was performed.  All systems are negative except for pertinent findings as noted.  Physical Exam:  Vital signs in last 24 hours: Temp:  [98.4 F (36.9 C)-101.8 F (38.8 C)] 98.4 F (36.9 C) (09/24 1205) Pulse Rate:  [94-103] 97 (09/24 1205) Resp:  [14-27] 22 (09/24 1205) BP: (117-152)/(66-84) 124/72 (09/24 1205) SpO2:  [95 %-99 %] 96 % (09/24 1205) Weight:  [70.3 kg] 70.3 kg (09/24 1232) Constitutional:  Alert and oriented, No acute distress Cardiovascular: Regular rate and rhythm, No JVD Respiratory: Normal respiratory effort, Lungs clear bilaterally  GU: No CVA tenderness Lymphatic: No lymphadenopathy Neurologic: Grossly intact, no focal deficits Psychiatric: Normal mood and affect  Laboratory Data:  Recent Labs    03/17/22 0904  WBC 12.7*  HGB 11.4*  HCT 34.2*  PLT 109*    Recent Labs    03/17/22 0833  NA 139  K 3.9  CL 108  GLUCOSE 103*  BUN 10  CALCIUM 8.9  CREATININE 0.75     Results for orders placed or performed during the hospital encounter of 03/17/22 (from the past 24 hour(s))  CBG monitoring, ED     Status: Abnormal   Collection Time: 03/17/22  7:20 AM  Result Value Ref Range   Glucose-Capillary 108 (H) 70 - 99 mg/dL  Urinalysis, Routine w reflex microscopic Urine, Clean Catch     Status: Abnormal   Collection Time: 03/17/22  8:18 AM  Result Value Ref Range   Color, Urine YELLOW YELLOW   APPearance HAZY (A) CLEAR   Specific Gravity, Urine 1.013 1.005 - 1.030   pH 6.0 5.0 - 8.0   Glucose, UA NEGATIVE NEGATIVE mg/dL   Hgb urine dipstick SMALL (A) NEGATIVE   Bilirubin Urine NEGATIVE NEGATIVE   Ketones, ur NEGATIVE NEGATIVE  mg/dL   Protein, ur NEGATIVE NEGATIVE mg/dL   Nitrite POSITIVE (A) NEGATIVE   Leukocytes,Ua SMALL (A) NEGATIVE   RBC / HPF 11-20 0 - 5 RBC/hpf   WBC, UA 11-20 0 - 5 WBC/hpf   Bacteria, UA RARE (A) NONE SEEN   Squamous Epithelial / LPF 0-5 0 - 5   Mucus PRESENT   Resp Panel by RT-PCR (Flu A&B, Covid) Anterior Nasal Swab     Status: None   Collection Time: 03/17/22  8:19 AM   Specimen: Anterior Nasal Swab  Result Value Ref Range   SARS Coronavirus 2 by RT PCR NEGATIVE NEGATIVE   Influenza A by PCR NEGATIVE NEGATIVE   Influenza B by PCR NEGATIVE NEGATIVE  Comprehensive metabolic panel     Status: Abnormal   Collection Time: 03/17/22  8:33 AM  Result Value Ref Range   Sodium 139 135 - 145 mmol/L   Potassium 3.9 3.5 - 5.1 mmol/L   Chloride 108 98 - 111 mmol/L   CO2 23 22 - 32 mmol/L   Glucose, Bld 103 (H) 70 - 99 mg/dL   BUN 10 6 - 20 mg/dL   Creatinine, Ser 0.75 0.61 - 1.24  mg/dL   Calcium 8.9 8.9 - 10.3 mg/dL   Total Protein 6.4 (L) 6.5 - 8.1 g/dL   Albumin 3.4 (L) 3.5 - 5.0 g/dL   AST 44 (H) 15 - 41 U/L   ALT 27 0 - 44 U/L   Alkaline Phosphatase 328 (H) 38 - 126 U/L   Total Bilirubin 1.4 (H) 0.3 - 1.2 mg/dL   GFR, Estimated >60 >60 mL/min   Anion gap 8 5 - 15  Blood gas, venous     Status: Abnormal   Collection Time: 03/17/22  8:33 AM  Result Value Ref Range   pH, Ven 7.47 (H) 7.25 - 7.43   pCO2, Ven 32 (L) 44 - 60 mmHg   pO2, Ven 58 (H) 32 - 45 mmHg   Bicarbonate 23.3 20.0 - 28.0 mmol/L   Acid-Base Excess 0.3 0.0 - 2.0 mmol/L   O2 Saturation 90 %   Patient temperature 37.0   Ammonia     Status: Abnormal   Collection Time: 03/17/22  8:33 AM  Result Value Ref Range   Ammonia 71 (H) 9 - 35 umol/L  Lactic acid, plasma     Status: Abnormal   Collection Time: 03/17/22  8:33 AM  Result Value Ref Range   Lactic Acid, Venous 2.4 (HH) 0.5 - 1.9 mmol/L  Lactic acid, plasma     Status: Abnormal   Collection Time: 03/17/22  8:33 AM  Result Value Ref Range   Lactic Acid,  Venous 2.9 (HH) 0.5 - 1.9 mmol/L  CBC with Differential     Status: Abnormal   Collection Time: 03/17/22  9:04 AM  Result Value Ref Range   WBC 12.7 (H) 4.0 - 10.5 K/uL   RBC 3.73 (L) 4.22 - 5.81 MIL/uL   Hemoglobin 11.4 (L) 13.0 - 17.0 g/dL   HCT 34.2 (L) 39.0 - 52.0 %   MCV 91.7 80.0 - 100.0 fL   MCH 30.6 26.0 - 34.0 pg   MCHC 33.3 30.0 - 36.0 g/dL   RDW 15.0 11.5 - 15.5 %   Platelets 109 (L) 150 - 400 K/uL   nRBC 0.0 0.0 - 0.2 %   Neutrophils Relative % 91 %   Neutro Abs 11.6 (H) 1.7 - 7.7 K/uL   Lymphocytes Relative 2 %   Lymphs Abs 0.3 (L) 0.7 - 4.0 K/uL   Monocytes Relative 6 %   Monocytes Absolute 0.7 0.1 - 1.0 K/uL   Eosinophils Relative 0 %   Eosinophils Absolute 0.0 0.0 - 0.5 K/uL   Basophils Relative 0 %   Basophils Absolute 0.0 0.0 - 0.1 K/uL   Immature Granulocytes 1 %   Abs Immature Granulocytes 0.10 (H) 0.00 - 0.07 K/uL   *Note: Due to a large number of results and/or encounters for the requested time period, some results have not been displayed. A complete set of results can be found in Results Review.   Recent Results (from the past 240 hour(s))  Resp Panel by RT-PCR (Flu A&B, Covid) Anterior Nasal Swab     Status: None   Collection Time: 03/17/22  8:19 AM   Specimen: Anterior Nasal Swab  Result Value Ref Range Status   SARS Coronavirus 2 by RT PCR NEGATIVE NEGATIVE Final    Comment: (NOTE) SARS-CoV-2 target nucleic acids are NOT DETECTED.  The SARS-CoV-2 RNA is generally detectable in upper respiratory specimens during the acute phase of infection. The lowest concentration of SARS-CoV-2 viral copies this assay can detect is 138 copies/mL. A negative result  does not preclude SARS-Cov-2 infection and should not be used as the sole basis for treatment or other patient management decisions. A negative result may occur with  improper specimen collection/handling, submission of specimen other than nasopharyngeal swab, presence of viral mutation(s) within  the areas targeted by this assay, and inadequate number of viral copies(<138 copies/mL). A negative result must be combined with clinical observations, patient history, and epidemiological information. The expected result is Negative.  Fact Sheet for Patients:  EntrepreneurPulse.com.au  Fact Sheet for Healthcare Providers:  IncredibleEmployment.be  This test is no t yet approved or cleared by the [place] FDA and  has been authorized for detection and/or diagnosis of SARS-CoV-2 by FDA under an Emergency Use Authorization (EUA). This EUA will remain  in effect (meaning this test can be used) for the duration of the COVID-19 declaration under Section 564(b)(1) of the Act, 21 U.S.C.section 360bbb-3(b)(1), unless the authorization is terminated  or revoked sooner.       Influenza A by PCR NEGATIVE NEGATIVE Final   Influenza B by PCR NEGATIVE NEGATIVE Final    Comment: (NOTE) The Xpert Xpress SARS-CoV-2/FLU/RSV plus assay is intended as an aid in the diagnosis of influenza from Nasopharyngeal swab specimens and should not be used as a sole basis for treatment. Nasal washings and aspirates are unacceptable for Xpert Xpress SARS-CoV-2/FLU/RSV testing.  Fact Sheet for Patients: EntrepreneurPulse.com.au  Fact Sheet for Healthcare Providers: IncredibleEmployment.be  This test is not yet approved or cleared by the [place] FDA and has been authorized for detection and/or diagnosis of SARS-CoV-2 by FDA under an Emergency Use Authorization (EUA). This EUA will remain in effect (meaning this test can be used) for the duration of the COVID-19 declaration under Section 564(b)(1) of the Act, 21 U.S.C. section 360bbb-3(b)(1), unless the authorization is terminated or revoked.  Performed at [facility], [place] [address], [place], [place] [address]     Renal Function: Recent Labs     03/17/22 3500  CREATININE 0.75   Estimated Creatinine Clearance: 97.6 mL/min (by C-G formula based on SCr of 0.75 mg/dL).  Radiologic Imaging: CT CHEST ABDOMEN PELVIS W CONTRAST  Result Date: 03/17/2022 CLINICAL DATA:  Bacteremic, history of stones, positive UTI, history of cirrhosis EXAM: CT CHEST, ABDOMEN, AND PELVIS WITH CONTRAST TECHNIQUE: Multidetector CT imaging of the chest, abdomen and pelvis was performed following the standard protocol during bolus administration of intravenous contrast. RADIATION DOSE REDUCTION: This exam was performed according to the departmental dose-optimization program which includes automated exposure control, adjustment of the mA and/or kV according to patient size and/or use of iterative reconstruction technique. CONTRAST:  171m OMNIPAQUE IOHEXOL 300 MG/ML  SOLN COMPARISON:  CT 11/30/2021, 05/06/2017, 10/12/2021 FINDINGS: CT CHEST FINDINGS Cardiovascular: Heart size within normal limits. Thoracic aorta is nonaneurysmal. Minimal atherosclerotic calcification of the aorta and coronary arteries. Central pulmonary vasculature within normal limits. Mediastinum/Nodes: No enlarged mediastinal, hilar, or axillary lymph nodes. Thyroid gland, trachea, and esophagus demonstrate no significant findings. Lungs/Pleura: Lungs are clear. No pleural effusion or pneumothorax. Musculoskeletal: Nondisplaced fractures of the lateral right sixth, seventh, and eighth ribs, which appear acute. There are subacute healing fractures of the anterolateral left fifth through seventh ribs. Additional old healed fractures of the right scapula and several right ribs. Chronic mild superior endplate compression fractures of T3 and T6. No suspicious lytic or sclerotic bone lesion is seen. CT ABDOMEN PELVIS FINDINGS Hepatobiliary: Significant interval enlargement of poorly defined hypoattenuating mass at the right hepatic dome comment now measuring  approximately 10.4 x 4.5 x 6.6 cm (series 4, image 41),  previously measured approximately 3.0 cm on 11/30/2021. Biliary dilatation is seen within the right hepatic lobe anteriorly. Cirrhotic liver morphology with nodular contour and caudate lobe hypertrophy. Gallbladder is moderately distended without hyperdense stone or inflammatory changes. Pancreas: Unremarkable. No pancreatic ductal dilatation or surrounding inflammatory changes. Spleen: Splenomegaly. Adrenals/Urinary Tract: Unremarkable adrenal glands. Multiple nonobstructing stones within the left kidney including a 1.1 cm stone within the renal pelvis. Subtle punctate focus of hyperdensity in the region of the right ureterovesical junction could represent a tiny stone (series 4, image 111). There is mild hydronephrosis of the right kidney. Unchanged left renal cysts, which do not require follow-up imaging. Urinary bladder wall is slightly thickened. Stomach/Bowel: Stomach is within normal limits. No evidence of bowel wall thickening, distention, or inflammatory changes. Vascular/Lymphatic: Aortic atherosclerosis. No enlarged abdominal or pelvic lymph nodes. Reproductive: Prostate is unremarkable. Other: No free fluid. No abdominopelvic fluid collection. No pneumoperitoneum. No abdominal wall hernia. Musculoskeletal: Prior left hip ORIF.  No acute bony findings. IMPRESSION: 1. Cirrhotic liver morphology with evidence of portal hypertension including splenomegaly. 2. Significant interval enlargement of poorly defined hypoattenuating mass at the right hepatic dome now measuring up to 10.4 x 4.5 x 6.6 cm, previously measured approximately 3.0 cm on 11/30/2021. Findings are concerning for hepatocellular carcinoma. Further evaluation with contrast-enhanced hepatic protocol MRI is recommended, preferably on a nonemergent, outpatient basis. 3. Mild right-sided hydronephrosis with a questionable punctate stone at the right ureterovesical junction. 4. Multiple nonobstructing left renal stones including a 1.1 cm stone within  the renal pelvis. 5. Nondisplaced fractures of the lateral right sixth, seventh, and eighth ribs, which appear acute. Subacute healing fractures of the anterolateral left fifth through seventh ribs. No pneumothorax. Electronically Signed   By: Davina Poke D.O.   On: 03/17/2022 11:57   CT Head Wo Contrast  Result Date: 03/17/2022 CLINICAL DATA:  Encephalopathic.  Known cancer. EXAM: CT HEAD WITHOUT CONTRAST CT CERVICAL SPINE WITHOUT CONTRAST TECHNIQUE: Multidetector CT imaging of the head and cervical spine was performed following the standard protocol without intravenous contrast. Multiplanar CT image reconstructions of the cervical spine were also generated. RADIATION DOSE REDUCTION: This exam was performed according to the departmental dose-optimization program which includes automated exposure control, adjustment of the mA and/or kV according to patient size and/or use of iterative reconstruction technique. COMPARISON:  December 17, 2021 FINDINGS: CT HEAD FINDINGS Brain: No subdural, epidural, or subarachnoid hemorrhage. Volume loss, particularly frontally is similar in the interval. No mass effect or midline shift. Ventricles and sulci are stable. Cerebellum, brainstem, and basal cisterns are within normal limits. No mass effect or midline shift. No acute cortical ischemia or infarct. Vascular: No hyperdense vessel or unexpected calcification. Skull: Normal. Negative for fracture or focal lesion. Sinuses/Orbits: No acute finding. Other: None. CT CERVICAL SPINE FINDINGS Alignment: 3 mm anterolisthesis of C6 versus C7, not significantly changed in the interval. No other malalignment. Skull base and vertebrae: No acute fracture. No primary bone lesion or focal pathologic process. Soft tissues and spinal canal: No prevertebral fluid or swelling. No visible canal hematoma. Disc levels: Multilevel degenerative disc disease. Mild facet degenerative changes. Upper chest: Negative. Other: No other abnormalities.  IMPRESSION: 1. No acute intracranial abnormalities. Volume loss with prominence of the sulci. 2. 3 mm anterolisthesis of C6 versus C7 is nonacute. No fracture or acute traumatic malalignment. Degenerative changes. Electronically Signed   By: Dorise Bullion III M.D.   On: 03/17/2022  11:52   CT Cervical Spine Wo Contrast  Result Date: 03/17/2022 CLINICAL DATA:  Encephalopathic.  Known cancer. EXAM: CT HEAD WITHOUT CONTRAST CT CERVICAL SPINE WITHOUT CONTRAST TECHNIQUE: Multidetector CT imaging of the head and cervical spine was performed following the standard protocol without intravenous contrast. Multiplanar CT image reconstructions of the cervical spine were also generated. RADIATION DOSE REDUCTION: This exam was performed according to the departmental dose-optimization program which includes automated exposure control, adjustment of the mA and/or kV according to patient size and/or use of iterative reconstruction technique. COMPARISON:  December 17, 2021 FINDINGS: CT HEAD FINDINGS Brain: No subdural, epidural, or subarachnoid hemorrhage. Volume loss, particularly frontally is similar in the interval. No mass effect or midline shift. Ventricles and sulci are stable. Cerebellum, brainstem, and basal cisterns are within normal limits. No mass effect or midline shift. No acute cortical ischemia or infarct. Vascular: No hyperdense vessel or unexpected calcification. Skull: Normal. Negative for fracture or focal lesion. Sinuses/Orbits: No acute finding. Other: None. CT CERVICAL SPINE FINDINGS Alignment: 3 mm anterolisthesis of C6 versus C7, not significantly changed in the interval. No other malalignment. Skull base and vertebrae: No acute fracture. No primary bone lesion or focal pathologic process. Soft tissues and spinal canal: No prevertebral fluid or swelling. No visible canal hematoma. Disc levels: Multilevel degenerative disc disease. Mild facet degenerative changes. Upper chest: Negative. Other: No other  abnormalities. IMPRESSION: 1. No acute intracranial abnormalities. Volume loss with prominence of the sulci. 2. 3 mm anterolisthesis of C6 versus C7 is nonacute. No fracture or acute traumatic malalignment. Degenerative changes. Electronically Signed   By: Dorise Bullion III M.D.   On: 03/17/2022 11:52   DG Chest Portable 1 View  Result Date: 03/17/2022 CLINICAL DATA:  Acute mental status change.  Fever. EXAM: PORTABLE CHEST 1 VIEW COMPARISON:  December 17, 2021 FINDINGS: The heart, hila, and mediastinum are unchanged. No pneumothorax. No nodules or masses. No focal infiltrate to explain the patient's fever. IMPRESSION: No active disease. Electronically Signed   By: Dorise Bullion III M.D.   On: 03/17/2022 07:48    I independently reviewed the above imaging studies.  Impression/Recommendation 1.  UTI with left nonobstructing renal calculi and questionable small distal ureteral stone with minimal hydronephrosis. Recommendation: Recommended admission for IV antibiotics, urine culture, initiation of tamsulosin as presumptive medical expulsive therapy/ strain urine.  If patient clinically deteriorating may need cystoscopy and possible stent.  We will follow  Remi Haggard 03/17/2022, 2:01 PM    Remi Haggard, MD   CC:

## 2022-03-17 NOTE — ED Provider Notes (Signed)
[facility] DEPT Provider Note   CSN: [id number] Arrival date & time: 03/17/22  8338     History  Chief Complaint  Patient presents with   Altered Mental Status    [name] [name] is a 61 y.o. male.  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  Patient is here with his wife who helped provide the history.  Over the past 2 to 3 days he has become progressively sick.  He has had no fevers with urinary complaints he tells me he has been having polyuria and dysuria.  He is also complaining of pain in his right flank and right upper quadrant of his abdomen.  He has had history of kidney stones and is unsure if this is similar.  He has a dry cough at baseline but no change in the cough.  His wife notes he has been increasingly more altered at home and confused.  Right now at the ER he is at his mental baseline but he has been having difficulty answering questions and is slower to respond.  There is been no focal deficits or recent falls that they are aware of.  She notes he is currently on chemotherapy for liver cancer.  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   Altered Mental Status      Home Medications Prior to Admission medications   Medication Sig Start Date End Date Taking? Authorizing Provider  acetaminophen (TYLENOL) 500 MG tablet Take 2 tablets (1,000 mg total) by mouth 3 (three) times daily. 12/21/21   Geradine Girt, DO  acitretin ([name]) 25 MG capsule Take 25 mg by mouth daily. 11/18/21   [provider]  apixaban (ELIQUIS) 2.5 MG TABS tablet Take 1 tablet (2.5 mg total) by  mouth 2 (two) times daily. Patient not taking: Reported on 03/06/2022 12/24/21   Geradine Girt, DO  CALCIUM PO Take 1 tablet by mouth daily.    [provider]  cemiplimab-rwlc (LIBTAYO) 350 MG/7ML SOLN injection Inject into the vein every 6 (six) weeks.    [provider]  Cholecalciferol (VITAMIN D-3 PO) Take 1 capsule by mouth daily.    [provider]  cycloSPORINE (RESTASIS) 0.05 % ophthalmic emulsion Place 1 drop into both eyes 2 (two) times daily as needed (dry eyes). 05/04/21   [provider]  desonide (DESOWEN) 0.05 % ointment Apply 1 Application topically 3 (three) times daily. Mix with Mupirocin 2%. To lower lip.    [provider]  diclofenac Sodium (VOLTAREN) 1 % GEL Apply 2 g topically 4 (four) times daily. 12/24/21   Geradine Girt, DO  docusate sodium (COLACE) 100 MG capsule Take 1 capsule (100 mg total) by mouth 2 (two) times daily. Patient not taking: Reported on 03/06/2022 12/21/21   Geradine Girt, DO  esomeprazole (NEXIUM) 40 MG capsule Take 1 capsule (40 mg total) by mouth 2 (two) times daily before a meal. 06/04/21   Ladene Artist, MD  Eszopiclone 3 MG TABS Take 3 mg by mouth at bedtime as needed (sleep).    [provider]  feeding supplement (ENSURE ENLIVE / ENSURE PLUS) LIQD  Take 237 mLs by mouth 2 (two) times daily between meals. 12/11/20   Cristal Deer, MD  folic acid (FOLVITE) 1 MG tablet Take 2 tablets (2 mg total) by mouth daily. 01/11/20   Daleen Squibb, MD  hydrocortisone 2.5 % cream Apply 1 Application topically 2 (two) times daily as needed (Itchy skin).    [provider]  Lactase (DAIRY RELIEF PO) Take 1 tablet by mouth in the morning and at bedtime.    [provider]  lamoTRIgine (LAMICTAL) 150 MG tablet Take 1 tablet (150 mg total) by mouth 2 (two) times daily. 10/15/21   Lomax, Amy, NP  latanoprost (XALATAN) 0.005 % ophthalmic solution Place 1 drop into both eyes at bedtime.      [provider]  lidocaine (LIDODERM) 5 % Place 1 patch onto the skin daily. Remove & Discard patch within 12 hours or as directed by MD Patient not taking: Reported on 03/06/2022 12/21/21   Geradine Girt, DO  mesalamine (LIALDA) 1.2 g EC tablet Take 2 tablets (2.4 g total) by mouth 2 (two) times daily. Patient taking differently: Take 2.4 g by mouth daily. 12/17/21 03/17/22  Ladene Artist, MD  methocarbamol (ROBAXIN) 750 MG tablet Take 1 tablet (750 mg total) by mouth every 8 (eight) hours as needed for muscle spasms. Patient not taking: Reported on 03/06/2022 12/24/21   Geradine Girt, DO  methscopolamine (PAMINE FORTE) 5 MG tablet Take 1 tablet (5 mg total) by mouth 2 (two) times daily. 12/17/21 03/17/22  Ladene Artist, MD  mupirocin ointment (BACTROBAN) 2 % Apply 1 Application topically 3 (three) times daily. Mix with Desonide 0.05%. To lower lip.    [provider]  Oxycodone HCl 10 MG TABS Take 1-1.5 tablets (10-15 mg total) by mouth every 4 (four) hours as needed. 12/24/21   Geradine Girt, DO  potassium chloride SA (KLOR-CON) 20 MEQ tablet Take 1 tablet (20 mEq total) by mouth 2 (two) times daily. 01/11/20   Daleen Squibb, MD  Probiotic Product (PROBIOTIC PO) Take 1 tablet at bedtime by mouth.     [provider]  rifaximin (XIFAXAN) 550 MG TABS tablet Take 1 tablet (550 mg total) by mouth 2 (two) times daily. 06/04/21   Ladene Artist, MD  sertraline (ZOLOFT) 100 MG tablet Take 200 mg daily with breakfast by mouth.  03/16/17   [provider]  tamsulosin (FLOMAX) 0.4 MG CAPS capsule Take 0.4 mg by mouth at bedtime. Patient not taking: Reported on 03/06/2022 12/07/21   [provider]  tolterodine (DETROL LA) 4 MG 24 hr capsule Take 4 mg by mouth daily. 01/25/21   [provider]  traZODone (DESYREL) 150 MG tablet Take 150 mg by mouth at bedtime.    [provider]      Allergies    Sulfonamide derivatives, Z-pak  [azithromycin], Dilaudid [hydromorphone hcl], and Sulfa antibiotics    Review of Systems   Review of Systems  Physical Exam Updated Vital Signs BP 124/72 (BP Location: Left Arm)   Pulse 97   Temp 98.4 F (36.9 C) (Oral)   Resp (!) 22   Ht 5' 11"  (1.803 m)   Wt 70.3 kg   SpO2 96%   BMI 21.62 kg/m  Physical Exam Constitutional: Alert and oriented x4.  Chronically ill-appearing male who is in bed intermittently shivering and unwell appearing Eyes: Conjunctivae are normal. ENT      Head: Normocephalic and atraumatic.  Nose: No congestion.      Mouth/Throat: Mucous membranes are dry.      Neck: No stridor. Cardiovascular: S1, S2, tachycardic, regular rhythm Respiratory: Tachypneic, BS CTA B, O2 sat 97 on RA Gastrointestinal: Soft and right upper quadrant tenderness and right CVA tenderness and suprapubic tenderness with no rebound or guarding Musculoskeletal: Normal range of motion in all extremities. No edema of the lower extremities Neurologic: AAOx4 at neurologic baseline moving all extremities equally.  Sensation grossly intact. Skin: Skin is warm, dry  Psychiatric: Mood and affect are normal. Speech and behavior are normal.  ED Results / Procedures / Treatments   Labs (all labs ordered are listed, but only abnormal results are displayed) Labs Reviewed  COMPREHENSIVE METABOLIC PANEL - Abnormal; Notable for the following components:      Result Value   Glucose, Bld 103 (*)    Total Protein 6.4 (*)    Albumin 3.4 (*)    AST 44 (*)    Alkaline Phosphatase 328 (*)    Total Bilirubin 1.4 (*)    All other components within normal limits  URINALYSIS, ROUTINE W REFLEX MICROSCOPIC - Abnormal; Notable for the following components:   APPearance HAZY (*)    Hgb urine dipstick SMALL (*)    Nitrite POSITIVE (*)    Leukocytes,Ua SMALL (*)    Bacteria, UA RARE (*)    All other components within normal limits  BLOOD GAS, VENOUS - Abnormal; Notable for the following components:    pH, Ven 7.47 (*)    pCO2, Ven 32 (*)    pO2, Ven 58 (*)    All other components within normal limits  AMMONIA - Abnormal; Notable for the following components:   Ammonia 71 (*)    All other components within normal limits  LACTIC ACID, PLASMA - Abnormal; Notable for the following components:   Lactic Acid, Venous 2.4 (*)    All other components within normal limits  CBC WITH DIFFERENTIAL/PLATELET - Abnormal; Notable for the following components:   WBC 12.7 (*)    RBC 3.73 (*)    Hemoglobin 11.4 (*)    HCT 34.2 (*)    Platelets 109 (*)    Neutro Abs 11.6 (*)    Lymphs Abs 0.3 (*)    Abs Immature Granulocytes 0.10 (*)    All other components within normal limits  CBG MONITORING, ED - Abnormal; Notable for the following components:   Glucose-Capillary 108 (*)    All other components within normal limits  RESP PANEL BY RT-PCR (FLU A&B, COVID) ARPGX2  CULTURE, BLOOD (ROUTINE X 2)  CULTURE, BLOOD (ROUTINE X 2)  LACTIC ACID, PLASMA    EKG None  Radiology CT CHEST ABDOMEN PELVIS W CONTRAST  Result Date: 03/17/2022 CLINICAL DATA:  Bacteremic, history of stones, positive UTI, history of cirrhosis EXAM: CT CHEST, ABDOMEN, AND PELVIS WITH CONTRAST TECHNIQUE: Multidetector CT imaging of the chest, abdomen and pelvis was performed following the standard protocol during bolus administration of intravenous contrast. RADIATION DOSE REDUCTION: This exam was performed according to the departmental dose-optimization program which includes automated exposure control, adjustment of the mA and/or kV according to patient size and/or use of iterative reconstruction technique. CONTRAST:  164m OMNIPAQUE IOHEXOL 300 MG/ML  SOLN COMPARISON:  CT 11/30/2021, 05/06/2017, 10/12/2021 FINDINGS: CT CHEST FINDINGS Cardiovascular: Heart size within normal limits. Thoracic aorta is nonaneurysmal. Minimal atherosclerotic calcification of the aorta and coronary arteries. Central pulmonary vasculature within normal  limits. Mediastinum/Nodes: No enlarged mediastinal, hilar, or axillary  lymph nodes. Thyroid gland, trachea, and esophagus demonstrate no significant findings. Lungs/Pleura: Lungs are clear. No pleural effusion or pneumothorax. Musculoskeletal: Nondisplaced fractures of the lateral right sixth, seventh, and eighth ribs, which appear acute. There are subacute healing fractures of the anterolateral left fifth through seventh ribs. Additional old healed fractures of the right scapula and several right ribs. Chronic mild superior endplate compression fractures of T3 and T6. No suspicious lytic or sclerotic bone lesion is seen. CT ABDOMEN PELVIS FINDINGS Hepatobiliary: Significant interval enlargement of poorly defined hypoattenuating mass at the right hepatic dome comment now measuring approximately 10.4 x 4.5 x 6.6 cm (series 4, image 41), previously measured approximately 3.0 cm on 11/30/2021. Biliary dilatation is seen within the right hepatic lobe anteriorly. Cirrhotic liver morphology with nodular contour and caudate lobe hypertrophy. Gallbladder is moderately distended without hyperdense stone or inflammatory changes. Pancreas: Unremarkable. No pancreatic ductal dilatation or surrounding inflammatory changes. Spleen: Splenomegaly. Adrenals/Urinary Tract: Unremarkable adrenal glands. Multiple nonobstructing stones within the left kidney including a 1.1 cm stone within the renal pelvis. Subtle punctate focus of hyperdensity in the region of the right ureterovesical junction could represent a tiny stone (series 4, image 111). There is mild hydronephrosis of the right kidney. Unchanged left renal cysts, which do not require follow-up imaging. Urinary bladder wall is slightly thickened. Stomach/Bowel: Stomach is within normal limits. No evidence of bowel wall thickening, distention, or inflammatory changes. Vascular/Lymphatic: Aortic atherosclerosis. No enlarged abdominal or pelvic lymph nodes. Reproductive: Prostate is  unremarkable. Other: No free fluid. No abdominopelvic fluid collection. No pneumoperitoneum. No abdominal wall hernia. Musculoskeletal: Prior left hip ORIF.  No acute bony findings. IMPRESSION: 1. Cirrhotic liver morphology with evidence of portal hypertension including splenomegaly. 2. Significant interval enlargement of poorly defined hypoattenuating mass at the right hepatic dome now measuring up to 10.4 x 4.5 x 6.6 cm, previously measured approximately 3.0 cm on 11/30/2021. Findings are concerning for hepatocellular carcinoma. Further evaluation with contrast-enhanced hepatic protocol MRI is recommended, preferably on a nonemergent, outpatient basis. 3. Mild right-sided hydronephrosis with a questionable punctate stone at the right ureterovesical junction. 4. Multiple nonobstructing left renal stones including a 1.1 cm stone within the renal pelvis. 5. Nondisplaced fractures of the lateral right sixth, seventh, and eighth ribs, which appear acute. Subacute healing fractures of the anterolateral left fifth through seventh ribs. No pneumothorax. Electronically Signed   By: Davina Poke D.O.   On: 03/17/2022 11:57   CT Head Wo Contrast  Result Date: 03/17/2022 CLINICAL DATA:  Encephalopathic.  Known cancer. EXAM: CT HEAD WITHOUT CONTRAST CT CERVICAL SPINE WITHOUT CONTRAST TECHNIQUE: Multidetector CT imaging of the head and cervical spine was performed following the standard protocol without intravenous contrast. Multiplanar CT image reconstructions of the cervical spine were also generated. RADIATION DOSE REDUCTION: This exam was performed according to the departmental dose-optimization program which includes automated exposure control, adjustment of the mA and/or kV according to patient size and/or use of iterative reconstruction technique. COMPARISON:  December 17, 2021 FINDINGS: CT HEAD FINDINGS Brain: No subdural, epidural, or subarachnoid hemorrhage. Volume loss, particularly frontally is similar in the  interval. No mass effect or midline shift. Ventricles and sulci are stable. Cerebellum, brainstem, and basal cisterns are within normal limits. No mass effect or midline shift. No acute cortical ischemia or infarct. Vascular: No hyperdense vessel or unexpected calcification. Skull: Normal. Negative for fracture or focal lesion. Sinuses/Orbits: No acute finding. Other: None. CT CERVICAL SPINE FINDINGS Alignment: 3 mm anterolisthesis of C6 versus C7, not  significantly changed in the interval. No other malalignment. Skull base and vertebrae: No acute fracture. No primary bone lesion or focal pathologic process. Soft tissues and spinal canal: No prevertebral fluid or swelling. No visible canal hematoma. Disc levels: Multilevel degenerative disc disease. Mild facet degenerative changes. Upper chest: Negative. Other: No other abnormalities. IMPRESSION: 1. No acute intracranial abnormalities. Volume loss with prominence of the sulci. 2. 3 mm anterolisthesis of C6 versus C7 is nonacute. No fracture or acute traumatic malalignment. Degenerative changes. Electronically Signed   By: Dorise Bullion III M.D.   On: 03/17/2022 11:52   CT Cervical Spine Wo Contrast  Result Date: 03/17/2022 CLINICAL DATA:  Encephalopathic.  Known cancer. EXAM: CT HEAD WITHOUT CONTRAST CT CERVICAL SPINE WITHOUT CONTRAST TECHNIQUE: Multidetector CT imaging of the head and cervical spine was performed following the standard protocol without intravenous contrast. Multiplanar CT image reconstructions of the cervical spine were also generated. RADIATION DOSE REDUCTION: This exam was performed according to the departmental dose-optimization program which includes automated exposure control, adjustment of the mA and/or kV according to patient size and/or use of iterative reconstruction technique. COMPARISON:  December 17, 2021 FINDINGS: CT HEAD FINDINGS Brain: No subdural, epidural, or subarachnoid hemorrhage. Volume loss, particularly frontally is similar  in the interval. No mass effect or midline shift. Ventricles and sulci are stable. Cerebellum, brainstem, and basal cisterns are within normal limits. No mass effect or midline shift. No acute cortical ischemia or infarct. Vascular: No hyperdense vessel or unexpected calcification. Skull: Normal. Negative for fracture or focal lesion. Sinuses/Orbits: No acute finding. Other: None. CT CERVICAL SPINE FINDINGS Alignment: 3 mm anterolisthesis of C6 versus C7, not significantly changed in the interval. No other malalignment. Skull base and vertebrae: No acute fracture. No primary bone lesion or focal pathologic process. Soft tissues and spinal canal: No prevertebral fluid or swelling. No visible canal hematoma. Disc levels: Multilevel degenerative disc disease. Mild facet degenerative changes. Upper chest: Negative. Other: No other abnormalities. IMPRESSION: 1. No acute intracranial abnormalities. Volume loss with prominence of the sulci. 2. 3 mm anterolisthesis of C6 versus C7 is nonacute. No fracture or acute traumatic malalignment. Degenerative changes. Electronically Signed   By: Dorise Bullion III M.D.   On: 03/17/2022 11:52   DG Chest Portable 1 View  Result Date: 03/17/2022 CLINICAL DATA:  Acute mental status change.  Fever. EXAM: PORTABLE CHEST 1 VIEW COMPARISON:  December 17, 2021 FINDINGS: The heart, hila, and mediastinum are unchanged. No pneumothorax. No nodules or masses. No focal infiltrate to explain the patient's fever. IMPRESSION: No active disease. Electronically Signed   By: Dorise Bullion III M.D.   On: 03/17/2022 07:48    Procedures .Critical Care  Performed by: Elgie Congo, MD Authorized by: Elgie Congo, MD   Critical care provider statement:    Critical care time (minutes):  35   Critical care was necessary to treat or prevent imminent or life-threatening deterioration of the following conditions:  Sepsis   Critical care was time spent personally by me on the following  activities:  Development of treatment plan with patient or surrogate, discussions with consultants, evaluation of patient's response to treatment, examination of patient, ordering and review of laboratory studies, ordering and review of radiographic studies, ordering and performing treatments and interventions, pulse oximetry, re-evaluation of patient's condition, review of old charts and obtaining history from patient or surrogate   Care discussed with: admitting provider     Remain on constant cardiac monitoring sinus tachycardia  Medications  Ordered in ED Medications  cefTRIAXone (ROCEPHIN) 1 g in sodium chloride 0.9 % 100 mL IVPB (0 g Intravenous Stopped 03/17/22 0957)  vancomycin (VANCOREADY) IVPB 1500 mg/300 mL (1,500 mg Intravenous New Bag/Given 03/17/22 1210)  sodium chloride (PF) 0.9 % injection (has no administration in time range)  lactulose (CHRONULAC) 10 GM/15ML solution 10 g (has no administration in time range)  tamsulosin (FLOMAX) capsule 0.4 mg (has no administration in time range)  acetaminophen (TYLENOL) tablet 1,000 mg (1,000 mg Oral Given 03/17/22 0841)  lactated ringers bolus 1,000 mL (0 mLs Intravenous Stopped 03/17/22 0954)  lactated ringers bolus 1,160 mL (0 mLs Intravenous Stopped 03/17/22 1212)  cefTRIAXone (ROCEPHIN) 1 g in sodium chloride 0.9 % 100 mL IVPB (0 g Intravenous Stopped 03/17/22 1030)  morphine (PF) 4 MG/ML injection 4 mg (4 mg Intravenous Given 03/17/22 0951)  iohexol (OMNIPAQUE) 300 MG/ML solution 100 mL (100 mLs Intravenous Contrast Given 03/17/22 1112)    ED Course/ Medical Decision Making/ A&P Clinical Course as of 03/17/22 1248  Sun Mar 17, 2022  1239 Patient's labs concerning for sepsis as suspected.  Lactate 2.4 with leukocytosis 1 12.7 and left shift.  Stable anemia hemoglobin 1.4.  Platelet count 109.  No hypercarbia.  Minimally elevated ammonia is 71 but at mental baseline.  Does not appear consistent with hepatic encephalopathy.  CT scans obtained.   No acute intracranial pathology.  However chest abdomen pelvis showed evidence of a punctate right UVJ stone with mild hydronephrosis and increased size of liver mass.  I spoke to [name] of urology who suggests Flomax, IV fluids and antibiotics at this time and holding off from stent unless he clinically worsens or does not improve since it is small and close to being expelled naturally.  He also has evidence of right-sided new rib fractures.  There is no external evidence of injury and his last fall was about a week prior.  Since these are old fractures, do not think he requires trauma evaluation but will obtain incentive spirometer.  Paged hospitalist for admission. [VB]  1248 Discussed case with Dr. Olevia Bowens who will be down to about the patient put in orders for admission for continued management of sepsis secondary to suspected infected stone/urosepsis. [VB]    Clinical Course User Index [VB] Elgie Congo, MD                           Medical Decision Making ELWIN TSOU is a 61 y.o. male.  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  Patient is medically complicated and chronically ill male who presents meeting sepsis criteria.  He is febrile 101.4 F tachycardic to the low 100s and tachypneic to the mid 20s.  With his urinary complaints and positive UA which showed evidence of positive nitrite small leukocyte esterase 11-20 WBCs and 11-20 RBCs with hemoglobin, concern for urosepsis or possible infected stone.  A chest x-ray showed no evidence of pneumonia and he has no URI complaints other than chronic cough.  Sepsis labs and blood cultures have been sent.  CT head and neck will be obtained due to recent falls although denying any head trauma or loss of consciousness.  CT chest abdomen pelvis will be further  obtained to further evaluate for possible superinfected stone or other acute intra-abdominal pathology  contributing to symptoms and further staging of liver cancer.  He will be covered with 30 cc/kg IV fluid bolus and broad-spectrum antibiotics due to MRSA history.  Patient will be admitted for further work-up and management of sepsis.  Amount and/or Complexity of Data Reviewed Labs: ordered. Radiology: ordered.  Risk OTC drugs. Prescription drug management. Decision regarding hospitalization.    Final Clinical Impression(s) / ED Diagnoses Final diagnoses:  Sepsis, due to unspecified organism, unspecified whether acute organ dysfunction present [facility])  Urinary tract infection without hematuria, site unspecified  Closed fracture of multiple ribs of right side, initial encounter    Rx / DC Orders ED Discharge Orders     None         Elgie Congo, MD 03/17/22 1249

## 2022-03-17 NOTE — H&P (Addendum)
History and Physical    Patient: [name] [name] MOQ:[id number] DOB: 1961/03/29 DOA: 03/17/2022 DOS: the patient was seen and examined on 03/17/2022 PCP: [name], MD  Patient coming from: Home  Chief Complaint:  Chief Complaint  Patient presents with   Altered Mental Status   HPI: [name] [name] is a 61 y.o. male with medical history significant of anxiety, depression, osteoarthritis, cataracts, complex partial seizures, chron's disease, type 2 diabetes, esophageal stricture, GERD, external hemorrhoids, glaucoma, nephrolithiasis, hyperlipidemia, hypertension, bilateral inguinal hernia, liver cirrhosis due to NASH, migraine headaches, osteoporosis, pathological fracture of the left hip, PTSD, left renal cyst, multiple skin squamous cell skin cancer, sleep apnea on CPAP, history of staph bacteremia with sepsis who presented to the emergency department with his wife due to altered mental status.  He has been having frequency, dysuria and right flank pain.  He has also had a frontal headache for the past 3 days.  No fever, chills or night sweats. No sore throat, rhinorrhea, dyspnea, wheezing or hemoptysis.  No chest pain, palpitations, diaphoresis, PND, orthopnea or pitting edema of the lower extremities.  No constipation, melena or hematochezia.  No flank pain, dysuria, frequency or hematuria.  No polyuria, polydipsia, polyphagia or blurred vision.  ED course: Initial vital signs were temperature 1 1.8 F, pulse 73, respiration 18, BP 152/84 mmHg O2 sat 99% on room air.  The patient received ceftriaxone 1 g IVPB, tamsulosin 0.4 mg capsule x1, vancomycin and 3100 mL of normal saline bolus.  Lab work: His lactic acid has been 2.4, 2.9 and 3.2 mmol/L.  Urinalysis was positive for nitrates small leukocyte esterase with rare bacteria microscopic examination.  Venous blood gas showed a pH of 7.47, PCO2 of 32 and PO2 of 58 mmHg.  Normal right coronary and acid-based excess.  CBC is her white count 12.7,  hemoglobin 11.4 g/dL platelets 109.  Ammonia was 71 mol/L.  CMP with normal electrolytes and renal function.  Glucose was 103 mg deciliter.  Total protein 6.4, albumin 3.4 g/dL.  AST 44, ALT 27 and alkaline phosphatase 328 units/L.  Total bilirubin was 1.4 mg/dL.  Imaging: Portable 1 view chest radiograph with no active disease.  CT head with no acute intracranial normalities.  CT cervical spine showed 3 mm anterolisthesis of 6 6 versus 6 7 which is nonacute.  There was no fracture or acute traumatic and on alignment.  CT chest abdomen and pelvis with contrast showed cirrhotic liver morphology.  There was interval enlargement of the poorly defined hypoattenuating mass at the right hepatic dome.  However, the patient has had a hepatic embolization and radiation recently.  Mild right-sided hydronephrosis with a questionable punctate urolith at the right UPJ.  Multiple nonobstructing left renal stone including a 1.1 cm nephrolith.  Nondisplaced fractures of the lateral right 6, 7 and 8 ribs which appeared to be acute.  Please see images and full radiology report for further details.   Review of Systems: As mentioned in the history of present illness. All other systems reviewed and are negative. Past Medical History:  Diagnosis Date   Adenomatous colon polyp 10/1999   Anxiety    Arthritis    neck, yoga helps.   Cataract    Complex partial seizure ([place])    last seizure was 08-30-2018   Crohn's disease of small and large intestines ([place]) 1999   Depression    Diabetes mellitus    no meds at this time 10-24-17   Esophageal stricture    External  hemorrhoids    GERD (gastroesophageal reflux disease)    Glaucoma    History of kidney stones    3 large stones still present   Hyperlipemia    Hypertension    resolved with weight loss    Inguinal hernia, bilateral 12/2019   Liver cirrhosis secondary to NASH [facility])    Migraines    Osteoporosis    Pathological fracture of left hip due to osteoporosis ([place])  12/18/2021   PONV (postoperative nausea and vomiting)    PTSD (post-traumatic stress disorder)    Renal cyst, acquired, left 07/08/2017   Skin cancer    squamous cell multiple; [place]; followed every 3 months.   Sleep apnea    CPAP machine, uses nightly   Staphylococcus aureus bacteremia with sepsis ([place]) 05/28/2017   MRSA 2012   Past Surgical History:  Procedure Laterality Date   CATARACT EXTRACTION Bilateral    COLONOSCOPY     DEBRIDEMENT MANDIBLE N/A 12/07/2020   Procedure: DEBRIDEMENT OF MANDIBLE;  Surgeon: [name], DMD;  Location: [place];  Service: Oral Surgery;  Laterality: N/A;   ELBOW SURGERY  2012   elbow MRSA infection    EYE SURGERY     cataracts removed, /w IOL   GROSS TEETH DEBRIDEMENT N/A 12/29/2020   Procedure: MANDIBULAR  DEBRIDEMENT;  Surgeon: [name], DMD;  Location: [place];  Service: Dentistry;  Laterality: N/A;   INCISION AND DRAINAGE ABSCESS N/A 12/07/2020   Procedure: INCISION AND DRAINAGE MANDIBULAR ABSCESS;  Surgeon: [name], DMD;  Location: [place];  Service: Oral Surgery;  Laterality: N/A;   INGUINAL HERNIA REPAIR Right 01/14/2020   Procedure: OPEN RIGHT HERNIA REPAIR INGUINAL WITH MESH;  Surgeon: [name], MD;  Location: [facility];  Service: General;  Laterality: Right;   INSERTION OF MESH N/A 07/01/2016   Procedure: INSERTION OF MESH;  Surgeon: [name], MD;  Location: [place];  Service: General;  Laterality: N/A;   INTRAMEDULLARY (IM) NAIL INTERTROCHANTERIC Left 12/17/2021   Procedure: INTRAMEDULLARY (IM) NAIL INTERTROCHANTRIC;  Surgeon: Altamese Kentwood, MD;  Location: [place];  Service: Orthopedics;  Laterality: Left;   IR URETERAL STENT LEFT NEW ACCESS W/O SEP NEPHROSTOMY CATH  02/02/2018   NEPHROLITHOTOMY Left 02/02/2018   Procedure: NEPHROLITHOTOMY PERCUTANEOUS;  Surgeon: [name], MD;  Location: [facility];  Service: Urology;  Laterality: Left;   POLYPECTOMY     SCALP LACERATION REPAIR Right 10/16/2017   From  fall/staples   SHOULDER ARTHROSCOPY Right 03/05/2018   Procedure: ARTHROSCOPY SHOULDER AND OPEN DISTAL CLAVICLE EXCISION;  Surgeon: [name], MD;  Location: [place];  Service: Orthopedics;  Laterality: Right;   SHOULDER SURGERY     SINUS SURGERY WITH INSTATRAK     TEE WITHOUT CARDIOVERSION N/A 05/09/2017   Procedure: TRANSESOPHAGEAL ECHOCARDIOGRAM (TEE);  Surgeon: [name], MD;  Location: [facility];  Service: Cardiovascular;  Laterality: N/A;   TOOTH EXTRACTION N/A 12/07/2020   Procedure: DENTAL EXTRACTIONS OF TEETH TWENTY TO THIRTY;  Surgeon: [name], DMD;  Location: [place];  Service: Oral Surgery;  Laterality: N/A;   TOOTH EXTRACTION N/A 12/29/2020   Procedure: DENTAL RESTORATION/EXTRACTIONS;  Surgeon: [name], DMD;  Location: [place];  Service: Dentistry;  Laterality: N/A;   TRANSTHORACIC ECHOCARDIOGRAM  02/22/2011   EF 55-65%; increased pattern of LVH with mild conc hypertrophy, abnormal relaxation & increased filling pressure (grade 2 diastolic dysfunction); atrial septum thickened (lipomatous hypertrophy)   UMBILICAL HERNIA REPAIR N/A 07/01/2016   Procedure: UMBILICAL HERNIA  REPAIR WITH MESH;  Surgeon: [name], MD;  Location: [place];  Service: General;  Laterality: N/A;   UPPER GASTROINTESTINAL ENDOSCOPY     VASECTOMY     Social History:  reports that he has never smoked. He has never been exposed to tobacco smoke. He has never used smokeless tobacco. He reports that he does not drink alcohol and does not use drugs.  Allergies  Allergen Reactions   Sulfonamide Derivatives Hives   Z-Pak [Azithromycin] Swelling   Dilaudid [Hydromorphone Hcl] Nausea And Vomiting   Sulfa Antibiotics Hives and Rash    Family History  Problem Relation Age of Onset   Heart disease Mother        CABG at age 46   Hyperlipidemia Mother    Hypertension Mother    Diabetes Father    Colon polyps Father    Stroke Father    Hyperlipidemia Father    Hypertension Father     Stroke Paternal Grandmother    Stroke Paternal Grandfather    Other Child        tetrology of fallot (cornealia deland syndrome)   Colon cancer Neg Hx    Esophageal cancer Neg Hx    Stomach cancer Neg Hx    Rectal cancer Neg Hx     Prior to Admission medications   Medication Sig Start Date End Date Taking? Authorizing Provider  acetaminophen (TYLENOL) 500 MG tablet Take 2 tablets (1,000 mg total) by mouth 3 (three) times daily. 12/21/21   Geradine Girt, DO  acitretin ([name]) 25 MG capsule Take 25 mg by mouth daily. 11/18/21   [provider]  apixaban (ELIQUIS) 2.5 MG TABS tablet Take 1 tablet (2.5 mg total) by mouth 2 (two) times daily. Patient not taking: Reported on 03/06/2022 12/24/21   Geradine Girt, DO  CALCIUM PO Take 1 tablet by mouth daily.    [provider]  cemiplimab-rwlc (LIBTAYO) 350 MG/7ML SOLN injection Inject into the vein every 6 (six) weeks.    [provider]  Cholecalciferol (VITAMIN D-3 PO) Take 1 capsule by mouth daily.    [provider]  cycloSPORINE (RESTASIS) 0.05 % ophthalmic emulsion Place 1 drop into both eyes 2 (two) times daily as needed (dry eyes). 05/04/21   [provider]  desonide (DESOWEN) 0.05 % ointment Apply 1 Application topically 3 (three) times daily. Mix with Mupirocin 2%. To lower lip.    [provider]  diclofenac Sodium (VOLTAREN) 1 % GEL Apply 2 g topically 4 (four) times daily. 12/24/21   Geradine Girt, DO  docusate sodium (COLACE) 100 MG capsule Take 1 capsule (100 mg total) by mouth 2 (two) times daily. Patient not taking: Reported on 03/06/2022 12/21/21   Geradine Girt, DO  esomeprazole (NEXIUM) 40 MG capsule Take 1 capsule (40 mg total) by mouth 2 (two) times daily before a meal. 06/04/21   Ladene Artist, MD  Eszopiclone 3 MG TABS Take 3 mg by mouth at bedtime as needed (sleep).    [provider]  feeding supplement (ENSURE ENLIVE / ENSURE PLUS) LIQD Take 237 mLs by  mouth 2 (two) times daily between meals. 12/11/20   Cristal Deer, MD  folic acid (FOLVITE) 1 MG tablet Take 2 tablets (2 mg total) by mouth daily. 01/11/20   Daleen Squibb, MD  hydrocortisone 2.5 % cream Apply 1 Application topically 2 (two) times daily as needed (Itchy skin).    [provider]  Lactase (DAIRY RELIEF  PO) Take 1 tablet by mouth in the morning and at bedtime.    [provider]  lamoTRIgine (LAMICTAL) 150 MG tablet Take 1 tablet (150 mg total) by mouth 2 (two) times daily. 10/15/21   Lomax, Amy, NP  latanoprost (XALATAN) 0.005 % ophthalmic solution Place 1 drop into both eyes at bedtime.     [provider]  lidocaine (LIDODERM) 5 % Place 1 patch onto the skin daily. Remove & Discard patch within 12 hours or as directed by MD Patient not taking: Reported on 03/06/2022 12/21/21   Geradine Girt, DO  mesalamine (LIALDA) 1.2 g EC tablet Take 2 tablets (2.4 g total) by mouth 2 (two) times daily. Patient taking differently: Take 2.4 g by mouth daily. 12/17/21 03/17/22  Ladene Artist, MD  methocarbamol (ROBAXIN) 750 MG tablet Take 1 tablet (750 mg total) by mouth every 8 (eight) hours as needed for muscle spasms. Patient not taking: Reported on 03/06/2022 12/24/21   Geradine Girt, DO  methscopolamine (PAMINE FORTE) 5 MG tablet Take 1 tablet (5 mg total) by mouth 2 (two) times daily. 12/17/21 03/17/22  Ladene Artist, MD  mupirocin ointment (BACTROBAN) 2 % Apply 1 Application topically 3 (three) times daily. Mix with Desonide 0.05%. To lower lip.    [provider]  Oxycodone HCl 10 MG TABS Take 1-1.5 tablets (10-15 mg total) by mouth every 4 (four) hours as needed. 12/24/21   Geradine Girt, DO  potassium chloride SA (KLOR-CON) 20 MEQ tablet Take 1 tablet (20 mEq total) by mouth 2 (two) times daily. 01/11/20   Daleen Squibb, MD  Probiotic Product (PROBIOTIC PO) Take 1 tablet at bedtime by mouth.     [provider]  rifaximin  (XIFAXAN) 550 MG TABS tablet Take 1 tablet (550 mg total) by mouth 2 (two) times daily. 06/04/21   Ladene Artist, MD  sertraline (ZOLOFT) 100 MG tablet Take 200 mg daily with breakfast by mouth.  03/16/17   [provider]  tamsulosin (FLOMAX) 0.4 MG CAPS capsule Take 0.4 mg by mouth at bedtime. Patient not taking: Reported on 03/06/2022 12/07/21   [provider]  tolterodine (DETROL LA) 4 MG 24 hr capsule Take 4 mg by mouth daily. 01/25/21   [provider]  traZODone (DESYREL) 150 MG tablet Take 150 mg by mouth at bedtime.    [provider]    Physical Exam: Vitals:   03/17/22 1045 03/17/22 1130 03/17/22 1205 03/17/22 1232  BP: 117/66 128/76 124/72   Pulse: 98 94 97   Resp: (!) 27 14 (!) 22   Temp:   98.4 F (36.9 C)   TempSrc:   Oral   SpO2: 95% 97% 96%   Weight:    70.3 kg  Height:    5' 11"  (1.803 m)   Physical Exam Vitals and nursing note reviewed.  Constitutional:      General: He is awake. He is not in acute distress.    Appearance: Normal appearance. He is ill-appearing.  HENT:     Head: Normocephalic.     Mouth/Throat:     Mouth: Mucous membranes are dry.  Eyes:     General: No scleral icterus.    Pupils: Pupils are equal, round, and reactive to light.  Neck:     Vascular: No JVD.  Cardiovascular:     Rate and Rhythm: Normal rate and regular rhythm.     Heart sounds: S1 normal and S2 normal.  Pulmonary:     Effort: Pulmonary effort is normal. No respiratory distress.     Breath sounds: No wheezing or rhonchi.  Abdominal:     General: Bowel sounds are normal. There is no distension.     Palpations: Abdomen is soft.     Tenderness: There is abdominal tenderness in the right lower quadrant, suprapubic area and left lower quadrant. There is right CVA tenderness. There is no guarding or rebound.  Musculoskeletal:     Cervical back: Neck supple.     Right lower leg: No edema.     Left lower leg: No edema.  Skin:    General:  Skin is warm and dry.  Neurological:     General: No focal deficit present.     Mental Status: He is alert and oriented to person, place, and time.  Psychiatric:        Mood and Affect: Mood normal.        Behavior: Behavior normal. Behavior is cooperative.   Data Reviewed:  There are no new results to review at this time.  Assessment and Plan: Principal Problem:   Sepsis secondary to UTI POA ([place]) Admit to PCU/inpatient. Continue IV fluids. Continue ceftriaxone 1 g IVPB daily. Significant history of MRSA infections. Will continue vancomycin per pharmacy. Follow-up blood culture and sensitivity Follow CBC and CMP in a.m. Urology consult appreciated.  Active Problems:   Hydronephrosis of right kidney   Nephrolith of the R UPJ Urology will be following. Continue sepsis treatment of SIBO. Continue tamsulosin.    DM2 (diabetes mellitus, type 2) (HCC) Carbohydrate modified diet. CBG monitoring before meals and bedtime.    CROHN'S DISEASE, LARGE AND SMALL INTESTINES Continue mesalamine 2.4 g p.o. twice daily    Depression Continue sertraline 200 mg p.o. daily. Continue trazodone 150 mg p.o. bedtime.    GERD Continue esomeprazole or formulary equivalent twice daily.    Complex partial seizure (HCC) Continue Lamictal 150 mg p.o. twice daily.    Hyperbilirubinemia/hyperammonemia Requestion not to take lactulose due to GI side effects. Xifaxan 550 mg p.o. twice daily ordered.     Advance Care Planning:   Code Status: Full Code   Consults: Urology Harold Barban, MD).  Family Communication: His wife was at bedside and provided some of the information.  Severity of Illness: The appropriate patient status for this patient is INPATIENT. Inpatient status is judged to be reasonable and necessary in order to provide the required intensity of service to ensure the patient's safety. The patient's presenting symptoms, physical exam findings, and initial radiographic and  laboratory data in the context of their chronic comorbidities is felt to place them at high risk for further clinical deterioration. Furthermore, it is not anticipated that the patient will be medically stable for discharge from the hospital within 2 midnights of admission.   * I certify that at the point of admission it is my clinical judgment that the patient will require inpatient hospital care spanning beyond 2 midnights from the point of admission due to high intensity of service, high risk for further deterioration and high frequency of surveillance required.*  Author: Reubin Milan, MD 03/17/2022 2:19 PM  For on call review [email or web address].   This document was prepared using Dragon voice recognition software and may contain some unintended transcription errors.

## 2022-03-18 ENCOUNTER — Inpatient Hospital Stay (HOSPITAL_COMMUNITY): Payer: Medicare Other | Admitting: Certified Registered Nurse Anesthetist

## 2022-03-18 ENCOUNTER — Inpatient Hospital Stay (HOSPITAL_COMMUNITY): Payer: Medicare Other

## 2022-03-18 ENCOUNTER — Other Ambulatory Visit: Payer: Self-pay

## 2022-03-18 ENCOUNTER — Encounter (HOSPITAL_COMMUNITY): Payer: Self-pay | Admitting: Internal Medicine

## 2022-03-18 ENCOUNTER — Encounter (HOSPITAL_COMMUNITY)
Admission: EM | Disposition: A | Discharge status: Home / Self Care | Payer: Self-pay | Source: Home / Self Care | Attending: Internal Medicine

## 2022-03-18 DIAGNOSIS — N200 Calculus of kidney: Secondary | ICD-10-CM | POA: Diagnosis not present

## 2022-03-18 DIAGNOSIS — I10 Essential (primary) hypertension: Secondary | ICD-10-CM | POA: Diagnosis not present

## 2022-03-18 DIAGNOSIS — E119 Type 2 diabetes mellitus without complications: Secondary | ICD-10-CM

## 2022-03-18 DIAGNOSIS — A419 Sepsis, unspecified organism: Secondary | ICD-10-CM | POA: Diagnosis not present

## 2022-03-18 DIAGNOSIS — F418 Other specified anxiety disorders: Secondary | ICD-10-CM

## 2022-03-18 DIAGNOSIS — K7460 Unspecified cirrhosis of liver: Secondary | ICD-10-CM | POA: Diagnosis not present

## 2022-03-18 DIAGNOSIS — N1330 Unspecified hydronephrosis: Secondary | ICD-10-CM | POA: Diagnosis not present

## 2022-03-18 HISTORY — PX: CYSTOSCOPY W/ URETERAL STENT PLACEMENT: SHX1429

## 2022-03-18 LAB — COMPREHENSIVE METABOLIC PANEL
ALT: 26 U/L (ref 0–44)
AST: 47 U/L — ABNORMAL HIGH (ref 15–41)
Albumin: 2.5 g/dL — ABNORMAL LOW (ref 3.5–5.0)
Alkaline Phosphatase: 270 U/L — ABNORMAL HIGH (ref 38–126)
Anion gap: 6 (ref 5–15)
BUN: 11 mg/dL (ref 6–20)
CO2: 23 mmol/L (ref 22–32)
Calcium: 8.2 mg/dL — ABNORMAL LOW (ref 8.9–10.3)
Chloride: 111 mmol/L (ref 98–111)
Creatinine, Ser: 0.61 mg/dL (ref 0.61–1.24)
GFR, Estimated: >60 mL/min (ref >60)
Glucose, Bld: 82 mg/dL (ref 70–99)
Potassium: 3.6 mmol/L (ref 3.5–5.1)
Sodium: 140 mmol/L (ref 135–145)
Total Bilirubin: 1.2 mg/dL (ref 0.3–1.2)
Total Protein: 5 g/dL — ABNORMAL LOW (ref 6.5–8.1)

## 2022-03-18 LAB — CBC
HCT: 29.7 % — ABNORMAL LOW (ref 39.0–52.0)
Hemoglobin: 9.3 g/dL — ABNORMAL LOW (ref 13.0–17.0)
MCH: 29.6 pg (ref 26.0–34.0)
MCHC: 31.3 g/dL (ref 30.0–36.0)
MCV: 94.6 fL (ref 80.0–100.0)
Platelets: 74 10*3/uL — ABNORMAL LOW (ref 150–400)
RBC: 3.14 MIL/uL — ABNORMAL LOW (ref 4.22–5.81)
RDW: 14.9 % (ref 11.5–15.5)
WBC: 4.9 10*3/uL (ref 4.0–10.5)
nRBC: 0 % (ref 0.0–0.2)

## 2022-03-18 LAB — GLUCOSE, CAPILLARY
Glucose-Capillary: 78 mg/dL (ref 70–99)
Glucose-Capillary: 74 mg/dL (ref 70–99)
Glucose-Capillary: 229 mg/dL — ABNORMAL HIGH (ref 70–99)
Glucose-Capillary: 116 mg/dL — ABNORMAL HIGH (ref 70–99)

## 2022-03-18 LAB — AMMONIA: Ammonia: 66 umol/L — ABNORMAL HIGH (ref 9–35)

## 2022-03-18 SURGERY — CYSTOSCOPY, WITH RETROGRADE PYELOGRAM AND URETERAL STENT INSERTION
Anesthesia: General | Laterality: Right

## 2022-03-18 MED ORDER — DEXAMETHASONE SODIUM PHOSPHATE 10 MG/ML IJ SOLN
INTRAMUSCULAR | Status: AC
  Filled 2022-03-18: qty 1

## 2022-03-18 MED ORDER — IOHEXOL 300 MG/ML  SOLN
INTRAMUSCULAR | Status: DC | PRN
  Administered 2022-03-18: 50 mL

## 2022-03-18 MED ORDER — DEXAMETHASONE SODIUM PHOSPHATE 10 MG/ML IJ SOLN
INTRAMUSCULAR | Status: DC | PRN
  Administered 2022-03-18: 4 mg via INTRAVENOUS

## 2022-03-18 MED ORDER — PHENYLEPHRINE 80 MCG/ML (10ML) SYRINGE FOR IV PUSH (FOR BLOOD PRESSURE SUPPORT)
PREFILLED_SYRINGE | INTRAVENOUS | Status: AC
  Filled 2022-03-18: qty 10

## 2022-03-18 MED ORDER — LACTATED RINGERS IV SOLN: INTRAVENOUS | Status: DC

## 2022-03-18 MED ORDER — AMISULPRIDE (ANTIEMETIC) 5 MG/2ML IV SOLN: 10.0000 mg | Freq: Once | INTRAVENOUS | Status: DC | PRN

## 2022-03-18 MED ORDER — PROPOFOL 10 MG/ML IV BOLUS
INTRAVENOUS | Status: DC | PRN
  Administered 2022-03-18: 120 mg via INTRAVENOUS

## 2022-03-18 MED ORDER — PROPOFOL 10 MG/ML IV BOLUS
INTRAVENOUS | Status: AC
  Filled 2022-03-18: qty 20

## 2022-03-18 MED ORDER — LIDOCAINE 2% (20 MG/ML) 5 ML SYRINGE
INTRAMUSCULAR | Status: DC | PRN
  Administered 2022-03-18: 40 mg via INTRAVENOUS

## 2022-03-18 MED ORDER — EPHEDRINE SULFATE-NACL 50-0.9 MG/10ML-% IV SOSY
PREFILLED_SYRINGE | INTRAVENOUS | Status: DC | PRN
  Administered 2022-03-18: 5 mg via INTRAVENOUS

## 2022-03-18 MED ORDER — SODIUM CHLORIDE 0.9 % IR SOLN
Status: DC | PRN
  Administered 2022-03-18: 3000 mL

## 2022-03-18 MED ORDER — FENTANYL CITRATE (PF) 100 MCG/2ML IJ SOLN
INTRAMUSCULAR | Status: AC
  Filled 2022-03-18: qty 2

## 2022-03-18 MED ORDER — ONDANSETRON HCL 4 MG/2ML IJ SOLN
INTRAMUSCULAR | Status: DC | PRN
  Administered 2022-03-18: 4 mg via INTRAVENOUS

## 2022-03-18 MED ORDER — 0.9 % SODIUM CHLORIDE (POUR BTL) OPTIME
TOPICAL | Status: DC | PRN
  Administered 2022-03-18: 500 mL

## 2022-03-18 MED ORDER — LIDOCAINE HCL (PF) 2 % IJ SOLN
INTRAMUSCULAR | Status: AC
  Filled 2022-03-18: qty 10

## 2022-03-18 MED ORDER — FENTANYL CITRATE PF 50 MCG/ML IJ SOSY: 25.0000 ug | PREFILLED_SYRINGE | INTRAMUSCULAR | Status: DC | PRN

## 2022-03-18 MED ORDER — ONDANSETRON HCL 4 MG/2ML IJ SOLN
INTRAMUSCULAR | Status: AC
  Filled 2022-03-18: qty 2

## 2022-03-18 MED ORDER — FENTANYL CITRATE (PF) 100 MCG/2ML IJ SOLN
INTRAMUSCULAR | Status: DC | PRN
  Administered 2022-03-18: 25 ug via INTRAVENOUS

## 2022-03-18 MED ORDER — MIDAZOLAM HCL 2 MG/2ML IJ SOLN
INTRAMUSCULAR | Status: AC
  Filled 2022-03-18: qty 2

## 2022-03-18 SURGICAL SUPPLY — 13 items
BAG URO CATCHER STRL LF (MISCELLANEOUS) ×2 IMPLANT
BULB IRRIG PATHFIND (MISCELLANEOUS) IMPLANT
CATH URETL OPEN END 6FR 70 (CATHETERS) ×2 IMPLANT
CLOTH BEACON ORANGE TIMEOUT ST (SAFETY) ×2 IMPLANT
GLOVE SURG LX STRL 7.5 STRW (GLOVE) ×2 IMPLANT
GOWN STRL REUS W/ TWL XL LVL3 (GOWN DISPOSABLE) ×2 IMPLANT
GOWN STRL REUS W/TWL XL LVL3 (GOWN DISPOSABLE) ×1
GUIDEWIRE STR DUAL SENSOR (WIRE) ×2 IMPLANT
MANIFOLD NEPTUNE II (INSTRUMENTS) ×2 IMPLANT
PACK CYSTO (CUSTOM PROCEDURE TRAY) ×2 IMPLANT
SYR 20ML LL LF (SYRINGE) ×2 IMPLANT
TUBING CONNECTING 10 (TUBING) ×2 IMPLANT
TUBING UROLOGY SET (TUBING) IMPLANT

## 2022-03-18 NOTE — Anesthesia Preprocedure Evaluation (Signed)
Anesthesia Evaluation  Patient identified by MRN, date of birth, ID band Patient awake    Reviewed: Allergy & Precautions, NPO status , Patient's Chart, lab work & pertinent test results  History of Anesthesia Complications (+) PONV and history of anesthetic complications  Airway Mallampati: III  TM Distance: >3 FB Neck ROM: Full    Dental  (+) Poor Dentition, Dental Advisory Given   Pulmonary sleep apnea and Continuous Positive Airway Pressure Ventilation ,    Pulmonary exam normal        Cardiovascular hypertension, Pt. on medications + DOE  Normal cardiovascular exam     Neuro/Psych  Headaches, Seizures - (roughly q/monthly),  PSYCHIATRIC DISORDERS Anxiety Depression    GI/Hepatic GERD  Medicated and Controlled,(+) Cirrhosis       , Hepatitis - ([facility]) Crohn's disease    Endo/Other  diabetes, Well Controlled, Type 2  Renal/GU Renal diseaseLeft Renal Calculus     Musculoskeletal  (+) Arthritis , Hx of Mandibular Osteomyelitis   Abdominal   Peds  Hematology  (+) Blood dyscrasia, anemia , Thrombocytopenia   Anesthesia Other Findings   Reproductive/Obstetrics                             Anesthesia Physical  Anesthesia Plan  ASA: 3  Anesthesia Plan: General   Post-op Pain Management: Minimal or no pain anticipated   Induction: Intravenous  PONV Risk Score and Plan: 4 or greater and Treatment may vary due to age or medical condition, Ondansetron and Dexamethasone  Airway Management Planned: LMA  Additional Equipment: None  Intra-op Plan:   Post-operative Plan: Extubation in OR  Informed Consent: I have reviewed the patients History and Physical, chart, labs and discussed the procedure including the risks, benefits and alternatives for the proposed anesthesia with the patient or authorized representative who has indicated his/her understanding and acceptance.     Dental  advisory given  Plan Discussed with: Anesthesiologist and CRNA  Anesthesia Plan Comments:         Anesthesia Quick Evaluation

## 2022-03-18 NOTE — Op Note (Signed)
Preoperative diagnosis:  1.  Right distal ureteral calculus with flank pain  Postoperative diagnosis: 1.  Passed right ureteral calculus  Procedure(s): 1.  Cystoscopy, right retrograde pyelogram with intraoperative interpretation, right ureteroscopy  Surgeon: Dr. Harold Barban  Anesthesia: General  Complications: None  EBL: Minimal  Specimens: None  Disposition of specimens: Not applicable  Intraoperative findings: Patulous right ureteral orifice small 3 mm stone in the urinary bladder.  Retrograde on the right shows no filling defect and emptied promptly.  Distal ureteroscopy confirmed no evidence of stone.  Stent was not placed  Indication: Patient is a 61 year old white male presented with severe flank pain and UTI and possible early sepsis.  Was found to have 3 mm right UVJ stone with mild hydro-.  Patient had persistent pain and nausea this morning presents this time to go cystoscopy ureteroscopy and stone extraction.  Description of procedure:  After obtaining informed consent for the patient he was taken the major cystoscopy suite placed under general anesthesia.  Placed in the dorsolithotomy position genitalia prepped and draped in usual sterile fashion.  57 French cystoscope was advanced in the bladder without difficulty.  Upon entering the bladder a 3 mm calculus was noted within the urinary bladder.  The right ureteral orifice was somewhat patulous and was effluxing clear urine.  5 French open tip catheter was utilized to cannulate the right ureteral orifice and retrograde pyelogram confirmed no filling defects proximally with mild dilation of the ureter.  This emptied out promptly upon removal of the retrograde catheter.  The 6.4 French semirigid ureteroscope was then advanced atraumatically into the bladder.  I was able to easily pass inside the right ureteral orifice and perform distal ureteroscopy showing no evidence of stone.  The ureteroscope was atraumatically  withdrawn.  As the ureter was widely patent and effluxing urine easily it was felt that stent was not indicated.  Bladder was emptied procedure terminated he was awakened from anesthesia and taken back to the recovery room in stable condition.  No immediate complication from the procedure.

## 2022-03-18 NOTE — Plan of Care (Signed)
  Problem: Activity: Goal: Risk for activity intolerance will decrease Outcome: Not Progressing   Problem: Nutrition: Goal: Adequate nutrition will be maintained Outcome: Not Progressing   Problem: Education: Goal: Knowledge of General Education information will improve Description: Including pain rating scale, medication(s)/side effects and non-pharmacologic comfort measures Outcome: Progressing   Problem: Health Behavior/Discharge Planning: Goal: Ability to manage health-related needs will improve Outcome: Progressing   Problem: Clinical Measurements: Goal: Ability to maintain clinical measurements within normal limits will improve Outcome: Progressing Goal: Will remain free from infection Outcome: Progressing Goal: Diagnostic test results will improve Outcome: Progressing Goal: Respiratory complications will improve Outcome: Progressing Goal: Cardiovascular complication will be avoided Outcome: Progressing   Problem: Coping: Goal: Level of anxiety will decrease Outcome: Progressing   Problem: Elimination: Goal: Will not experience complications related to bowel motility Outcome: Progressing Goal: Will not experience complications related to urinary retention Outcome: Progressing   Problem: Pain Managment: Goal: General experience of comfort will improve Outcome: Progressing   Problem: Safety: Goal: Ability to remain free from injury will improve Outcome: Progressing   Problem: Skin Integrity: Goal: Risk for impaired skin integrity will decrease Outcome: Progressing

## 2022-03-18 NOTE — Anesthesia Procedure Notes (Signed)
Procedure Name: LMA Insertion Date/Time: 03/18/2022 12:32 PM  Performed by: West Pugh, CRNAPre-anesthesia Checklist: Patient identified, Emergency Drugs available, Suction available, Patient being monitored and Timeout performed Patient Re-evaluated:Patient Re-evaluated prior to induction Oxygen Delivery Method: Circle system utilized Preoxygenation: Pre-oxygenation with 100% oxygen Induction Type: IV induction Ventilation: Mask ventilation without difficulty LMA: LMA inserted LMA Size: 4.0 Number of attempts: 1 Placement Confirmation: positive ETCO2 Tube secured with: Tape Dental Injury: Teeth and Oropharynx as per pre-operative assessment

## 2022-03-18 NOTE — Progress Notes (Signed)
Day of Surgery Subjective: Patient still with severe right-sided flank pain and nausea.  Feels very weak.  Low-grade fever up to 100.  Objective: Vital signs in last 24 hours: Temp:  [98.1 F (36.7 C)-100 F (37.8 C)] 98.7 F (37.1 C) (09/25 1142) Pulse Rate:  [66-86] 78 (09/25 1142) Resp:  [16-22] 16 (09/25 1142) BP: (95-149)/(61-77) 149/77 (09/25 1142) SpO2:  [96 %-99 %] 99 % (09/25 1142) Weight:  [65.8 kg-71.1 kg] 65.8 kg (09/25 1201)  Intake/Output from previous day: 09/24 0701 - 09/25 0700 In: 4076.5 [P.O.:500; IV Piggyback:3576.5] Out: 1900 [Urine:1900] Intake/Output this shift: Total I/O In: -  Out: 1250 [Urine:1250]  Physical Exam:  General: Alert and oriented CV: RRR Lungs: Clear Abdomen: Positive right lower quadrant tenderness and right CVA tenderness Incisions: Ext: NT, No erythema  Lab Results: Recent Labs    03/17/22 0904 03/18/22 0525  HGB 11.4* 9.3*  HCT 34.2* 29.7*   BMET Recent Labs    03/17/22 0833 03/18/22 0525  NA 139 140  K 3.9 3.6  CL 108 111  CO2 23 23  GLUCOSE 103* 82  BUN 10 11  CREATININE 0.75 0.61  CALCIUM 8.9 8.2*     Studies/Results: CT CHEST ABDOMEN PELVIS W CONTRAST  Result Date: 03/17/2022 CLINICAL DATA:  Bacteremic, history of stones, positive UTI, history of cirrhosis EXAM: CT CHEST, ABDOMEN, AND PELVIS WITH CONTRAST TECHNIQUE: Multidetector CT imaging of the chest, abdomen and pelvis was performed following the standard protocol during bolus administration of intravenous contrast. RADIATION DOSE REDUCTION: This exam was performed according to the departmental dose-optimization program which includes automated exposure control, adjustment of the mA and/or kV according to patient size and/or use of iterative reconstruction technique. CONTRAST:  149m OMNIPAQUE IOHEXOL 300 MG/ML  SOLN COMPARISON:  CT 11/30/2021, 05/06/2017, 10/12/2021 FINDINGS: CT CHEST FINDINGS Cardiovascular: Heart size within normal limits. Thoracic aorta  is nonaneurysmal. Minimal atherosclerotic calcification of the aorta and coronary arteries. Central pulmonary vasculature within normal limits. Mediastinum/Nodes: No enlarged mediastinal, hilar, or axillary lymph nodes. Thyroid gland, trachea, and esophagus demonstrate no significant findings. Lungs/Pleura: Lungs are clear. No pleural effusion or pneumothorax. Musculoskeletal: Nondisplaced fractures of the lateral right sixth, seventh, and eighth ribs, which appear acute. There are subacute healing fractures of the anterolateral left fifth through seventh ribs. Additional old healed fractures of the right scapula and several right ribs. Chronic mild superior endplate compression fractures of T3 and T6. No suspicious lytic or sclerotic bone lesion is seen. CT ABDOMEN PELVIS FINDINGS Hepatobiliary: Significant interval enlargement of poorly defined hypoattenuating mass at the right hepatic dome comment now measuring approximately 10.4 x 4.5 x 6.6 cm (series 4, image 41), previously measured approximately 3.0 cm on 11/30/2021. Biliary dilatation is seen within the right hepatic lobe anteriorly. Cirrhotic liver morphology with nodular contour and caudate lobe hypertrophy. Gallbladder is moderately distended without hyperdense stone or inflammatory changes. Pancreas: Unremarkable. No pancreatic ductal dilatation or surrounding inflammatory changes. Spleen: Splenomegaly. Adrenals/Urinary Tract: Unremarkable adrenal glands. Multiple nonobstructing stones within the left kidney including a 1.1 cm stone within the renal pelvis. Subtle punctate focus of hyperdensity in the region of the right ureterovesical junction could represent a tiny stone (series 4, image 111). There is mild hydronephrosis of the right kidney. Unchanged left renal cysts, which do not require follow-up imaging. Urinary bladder wall is slightly thickened. Stomach/Bowel: Stomach is within normal limits. No evidence of bowel wall thickening, distention, or  inflammatory changes. Vascular/Lymphatic: Aortic atherosclerosis. No enlarged abdominal or pelvic lymph nodes. Reproductive:  Prostate is unremarkable. Other: No free fluid. No abdominopelvic fluid collection. No pneumoperitoneum. No abdominal wall hernia. Musculoskeletal: Prior left hip ORIF.  No acute bony findings. IMPRESSION: 1. Cirrhotic liver morphology with evidence of portal hypertension including splenomegaly. 2. Significant interval enlargement of poorly defined hypoattenuating mass at the right hepatic dome now measuring up to 10.4 x 4.5 x 6.6 cm, previously measured approximately 3.0 cm on 11/30/2021. Findings are concerning for hepatocellular carcinoma. Further evaluation with contrast-enhanced hepatic protocol MRI is recommended, preferably on a nonemergent, outpatient basis. 3. Mild right-sided hydronephrosis with a questionable punctate stone at the right ureterovesical junction. 4. Multiple nonobstructing left renal stones including a 1.1 cm stone within the renal pelvis. 5. Nondisplaced fractures of the lateral right sixth, seventh, and eighth ribs, which appear acute. Subacute healing fractures of the anterolateral left fifth through seventh ribs. No pneumothorax. Electronically Signed   By: Davina Poke D.O.   On: 03/17/2022 11:57   CT Head Wo Contrast  Result Date: 03/17/2022 CLINICAL DATA:  Encephalopathic.  Known cancer. EXAM: CT HEAD WITHOUT CONTRAST CT CERVICAL SPINE WITHOUT CONTRAST TECHNIQUE: Multidetector CT imaging of the head and cervical spine was performed following the standard protocol without intravenous contrast. Multiplanar CT image reconstructions of the cervical spine were also generated. RADIATION DOSE REDUCTION: This exam was performed according to the departmental dose-optimization program which includes automated exposure control, adjustment of the mA and/or kV according to patient size and/or use of iterative reconstruction technique. COMPARISON:  December 17, 2021  FINDINGS: CT HEAD FINDINGS Brain: No subdural, epidural, or subarachnoid hemorrhage. Volume loss, particularly frontally is similar in the interval. No mass effect or midline shift. Ventricles and sulci are stable. Cerebellum, brainstem, and basal cisterns are within normal limits. No mass effect or midline shift. No acute cortical ischemia or infarct. Vascular: No hyperdense vessel or unexpected calcification. Skull: Normal. Negative for fracture or focal lesion. Sinuses/Orbits: No acute finding. Other: None. CT CERVICAL SPINE FINDINGS Alignment: 3 mm anterolisthesis of C6 versus C7, not significantly changed in the interval. No other malalignment. Skull base and vertebrae: No acute fracture. No primary bone lesion or focal pathologic process. Soft tissues and spinal canal: No prevertebral fluid or swelling. No visible canal hematoma. Disc levels: Multilevel degenerative disc disease. Mild facet degenerative changes. Upper chest: Negative. Other: No other abnormalities. IMPRESSION: 1. No acute intracranial abnormalities. Volume loss with prominence of the sulci. 2. 3 mm anterolisthesis of C6 versus C7 is nonacute. No fracture or acute traumatic malalignment. Degenerative changes. Electronically Signed   By: Dorise Bullion III M.D.   On: 03/17/2022 11:52   CT Cervical Spine Wo Contrast  Result Date: 03/17/2022 CLINICAL DATA:  Encephalopathic.  Known cancer. EXAM: CT HEAD WITHOUT CONTRAST CT CERVICAL SPINE WITHOUT CONTRAST TECHNIQUE: Multidetector CT imaging of the head and cervical spine was performed following the standard protocol without intravenous contrast. Multiplanar CT image reconstructions of the cervical spine were also generated. RADIATION DOSE REDUCTION: This exam was performed according to the departmental dose-optimization program which includes automated exposure control, adjustment of the mA and/or kV according to patient size and/or use of iterative reconstruction technique. COMPARISON:  December 17, 2021 FINDINGS: CT HEAD FINDINGS Brain: No subdural, epidural, or subarachnoid hemorrhage. Volume loss, particularly frontally is similar in the interval. No mass effect or midline shift. Ventricles and sulci are stable. Cerebellum, brainstem, and basal cisterns are within normal limits. No mass effect or midline shift. No acute cortical ischemia or infarct. Vascular: No hyperdense vessel or  unexpected calcification. Skull: Normal. Negative for fracture or focal lesion. Sinuses/Orbits: No acute finding. Other: None. CT CERVICAL SPINE FINDINGS Alignment: 3 mm anterolisthesis of C6 versus C7, not significantly changed in the interval. No other malalignment. Skull base and vertebrae: No acute fracture. No primary bone lesion or focal pathologic process. Soft tissues and spinal canal: No prevertebral fluid or swelling. No visible canal hematoma. Disc levels: Multilevel degenerative disc disease. Mild facet degenerative changes. Upper chest: Negative. Other: No other abnormalities. IMPRESSION: 1. No acute intracranial abnormalities. Volume loss with prominence of the sulci. 2. 3 mm anterolisthesis of C6 versus C7 is nonacute. No fracture or acute traumatic malalignment. Degenerative changes. Electronically Signed   By: Dorise Bullion III M.D.   On: 03/17/2022 11:52   DG Chest Portable 1 View  Result Date: 03/17/2022 CLINICAL DATA:  Acute mental status change.  Fever. EXAM: PORTABLE CHEST 1 VIEW COMPARISON:  December 17, 2021 FINDINGS: The heart, hila, and mediastinum are unchanged. No pneumothorax. No nodules or masses. No focal infiltrate to explain the patient's fever. IMPRESSION: No active disease. Electronically Signed   By: Dorise Bullion III M.D.   On: 03/17/2022 07:48    Assessment/Plan: 1.  Likely small right UVJ stone based on CT scan with continued pain and nausea Recommendation: Plan for cystoscopy possible ureteroscopy and stent today.  Risks and benefits of the procedure were discussed in detail  with patient and wife today.    LOS: 1 day   Remi Haggard 03/18/2022, 12:11 PM

## 2022-03-18 NOTE — Transfer of Care (Signed)
Immediate Anesthesia Transfer of Care Note  Patient: [name] [name]  Procedure(s) Performed: CYSTOSCOPY WITH RETROGRADE PYELOGRAM, URETEROSCOPY (Right)  Patient Location: PACU  Anesthesia Type:General  Level of Consciousness: drowsy and patient cooperative  Airway & Oxygen Therapy: Patient Spontanous Breathing and Patient connected to face mask oxygen  Post-op Assessment: Report given to RN and Post -op Vital signs reviewed and stable  Post vital signs: Reviewed and stable  Last Vitals:  Vitals Value Taken Time  BP 129/73 03/18/22 1302  Temp    Pulse 75 03/18/22 1304  Resp 11 03/18/22 1304  SpO2 100 % 03/18/22 1304  Vitals shown include unvalidated device data.  Last Pain:  Vitals:   03/18/22 1142  TempSrc: Oral  PainSc:       Patients Stated Pain Goal: 0 (52/[id number])  Complications: No notable events documented.

## 2022-03-18 NOTE — Progress Notes (Signed)
PROGRESS NOTE   [name] [name]  OBS:[id number]    DOB: September 12, 1960    DOA: 03/17/2022  PCP: [name], MD   I have briefly reviewed patients previous medical records in [facility].  Chief Complaint  Patient presents with   Altered Mental Status    Brief Narrative:  61 year old male with PMH of recurrent squamous cell carcinoma of the skin, s/p several Mohs surgeries and curative radiotherapy, type II DM, HTN, HLD, GERD, liver cirrhosis due to NASH, Crohn's disease, pathological left hip fracture, OSA on CPAP, staphylococcal bacteremia with sepsis, presented to the ED 03/17/2022 with complaints of altered mental status, urinary frequency, dysuria, right flank pain and frontal headache.  Admitted for sepsis, POA due to acute cystitis complicating right UPJ obstructing stone.  Urology consulted and plan cystoscopy and stent.   Assessment & Plan:  Principal Problem:   Sepsis secondary to UTI [facility]) Active Problems:   DM2 (diabetes mellitus, type 2) ([place])   CROHN'S DISEASE, LARGE AND SMALL INTESTINES   Depression   GERD   Complex partial seizure ([place])   Hyperbilirubinemia   Hydronephrosis of right kidney   Nephrolith   Sepsis secondary to UTI POA [facility]), complicating right UPJ stone Presented with fever of 101.8, tachycardia and leukocytosis.  SARS coronavirus 2 by RT-PCR, flu panel negative.  Blood cultures x2 sent and pending.  Lactate had peaked to 3.2, down to 2.8.  Chest x-ray negative.  It does not appear that a urine culture was sent off, will add to yesterday's sample if possible.  Continue empirically started IV ceftriaxone and also on vancomycin due to history of MRSA infections.  Management of nephrolithiasis per urology.    Hydronephrosis of right kidney   Nephrolith of the R UPJ Urology consultation appreciated.  Questionable small distal ureteral stone with minimal hydronephrosis.  Urine culture, tamsulosin, antibiotics as above.  As per nursing, plan is for  cystoscopy and stent placement today.     DM2 (diabetes mellitus, type 2) (HCC) Carbohydrate modified diet. CBG monitoring before meals and bedtime.  Good inpatient control.     CROHN'S DISEASE, LARGE AND SMALL INTESTINES Continue mesalamine 2.4 g p.o. twice daily     Depression Continue sertraline 200 mg p.o. daily. Continue trazodone 150 mg p.o. bedtime.     GERD Continue esomeprazole or formulary equivalent twice daily.     Complex partial seizure (HCC) Continue Lamictal 150 mg p.o. twice daily.     Hyperbilirubinemia/hyperammonemia Requestion not to take lactulose due to GI side effects. Xifaxan 550 mg p.o. twice daily ordered.  Anemia, possibly of chronic disease, cirrhosis: Stable compared to prior numbers.  Thrombocytopenia: Platelet counts fluctuating but appears close to his baseline.  Acute metabolic encephalopathy: Likely multifactorial due to infectious etiology, pain, hepatic encephalopathy etc.  Appears better.  May not be at baseline yet.  CT head and neck without acute findings.  Additional CT abdomen findings 9/24: Cirrhosis of liver with portal hypertension and splenomegaly: Liver mass 10.4 x 4.5 x 6.6 cm at the right hepatic dome, concerning for hepatocellular carcinoma, will need contrast-enhanced hepatic protocol MRI-likely outpatient.  Will check tumor markers. Nondisplaced multiple rib fractures on the right and left.  No pneumothorax. Reports history of fall a month ago.  Body mass index is 21.86 kg/m. Consulted Firefighter.  Nutritional Status        Pressure Ulcer:     DVT prophylaxis: SCDs Start: 03/17/22 1330     Code Status: Full Code:  Family Communication:  None at bedside. Disposition:  Status is: Inpatient Remains inpatient appropriate because: IV antibiotics, need for cystoscopy and stent.     Consultants:   Urology  Procedures:     Antimicrobials:   IV ceftriaxone and vancomycin 9/24 >   Subjective:   States that he is "disoriented".  Overall reports that he feels better but unable to say what is better.  Objective:   Vitals:   03/17/22 1555 03/17/22 2108 03/18/22 0022 03/18/22 0530  BP: 95/66 125/67 115/61 122/67  Pulse: 79 75 86 66  Resp: 20 16 17 16   Temp: 99 F (37.2 C) 99.2 F (37.3 C) 99.5 F (37.5 C) 98.1 F (36.7 C)  TempSrc: Oral Oral Oral Oral  SpO2: 97% 99% 96% 99%  Weight:    71.1 kg  Height:        General exam: Middle-age male, looks older than stated age, chronically ill looking and frail, lying comfortably supine in bed.  Oral mucosa dry. Respiratory system: Clear to auscultation. Respiratory effort normal.  Reproducible chest wall tenderness bilaterally anteriorly, more so on the left. Cardiovascular system: S1 & S2 heard, RRR. No JVD, murmurs, rubs, gallops or clicks. No pedal edema. Gastrointestinal system: Abdomen is nondistended, soft and nontender. No organomegaly or masses felt. Normal bowel sounds heard. Central nervous system: Alert and oriented. No focal neurological deficits. Extremities: Symmetric 5 x 5 power. Skin: No rashes, lesions or ulcers Psychiatry: Judgement and insight into. Mood & affect appropriate.     Data Reviewed:   I have personally reviewed following labs and imaging studies   CBC: Recent Labs  Lab 03/17/22 0904 03/18/22 0525  WBC 12.7* 4.9  NEUTROABS 11.6*  --   HGB 11.4* 9.3*  HCT 34.2* 29.7*  MCV 91.7 94.6  PLT 109* 74*    Basic Metabolic Panel: Recent Labs  Lab 03/17/22 0833 03/18/22 0525  NA 139 140  K 3.9 3.6  CL 108 111  CO2 23 23  GLUCOSE 103* 82  BUN 10 11  CREATININE 0.75 0.61  CALCIUM 8.9 8.2*    Liver Function Tests: Recent Labs  Lab 03/17/22 0833 03/18/22 0525  AST 44* 47*  ALT 27 26  ALKPHOS 328* 270*  BILITOT 1.4* 1.2  PROT 6.4* 5.0*  ALBUMIN 3.4* 2.5*    CBG: Recent Labs  Lab 03/17/22 0720 03/17/22 2111 03/18/22 0738  GLUCAP 108* 94 78    Microbiology Studies:    Recent Results (from the past 240 hour(s))  Resp Panel by RT-PCR (Flu A&B, Covid) Anterior Nasal Swab     Status: None   Collection Time: 03/17/22  8:19 AM   Specimen: Anterior Nasal Swab  Result Value Ref Range Status   SARS Coronavirus 2 by RT PCR NEGATIVE NEGATIVE Final    Comment: (NOTE) SARS-CoV-2 target nucleic acids are NOT DETECTED.  The SARS-CoV-2 RNA is generally detectable in upper respiratory specimens during the acute phase of infection. The lowest concentration of SARS-CoV-2 viral copies this assay can detect is 138 copies/mL. A negative result does not preclude SARS-Cov-2 infection and should not be used as the sole basis for treatment or other patient management decisions. A negative result may occur with  improper specimen collection/handling, submission of specimen other than nasopharyngeal swab, presence of viral mutation(s) within the areas targeted by this assay, and inadequate number of viral copies(<138 copies/mL). A negative result must be combined with clinical observations, patient history, and epidemiological information. The expected result is Negative.  Fact Sheet for Patients:  EntrepreneurPulse.com.au  Fact Sheet for Healthcare Providers:  IncredibleEmployment.be  This test is no t yet approved or cleared by the [place] FDA and  has been authorized for detection and/or diagnosis of SARS-CoV-2 by FDA under an Emergency Use Authorization (EUA). This EUA will remain  in effect (meaning this test can be used) for the duration of the COVID-19 declaration under Section 564(b)(1) of the Act, 21 U.S.C.section 360bbb-3(b)(1), unless the authorization is terminated  or revoked sooner.       Influenza A by PCR NEGATIVE NEGATIVE Final   Influenza B by PCR NEGATIVE NEGATIVE Final    Comment: (NOTE) The Xpert Xpress SARS-CoV-2/FLU/RSV plus assay is intended as an aid in the diagnosis of influenza from  Nasopharyngeal swab specimens and should not be used as a sole basis for treatment. Nasal washings and aspirates are unacceptable for Xpert Xpress SARS-CoV-2/FLU/RSV testing.  Fact Sheet for Patients: EntrepreneurPulse.com.au  Fact Sheet for Healthcare Providers: IncredibleEmployment.be  This test is not yet approved or cleared by the [place] FDA and has been authorized for detection and/or diagnosis of SARS-CoV-2 by FDA under an Emergency Use Authorization (EUA). This EUA will remain in effect (meaning this test can be used) for the duration of the COVID-19 declaration under Section 564(b)(1) of the Act, 21 U.S.C. section 360bbb-3(b)(1), unless the authorization is terminated or revoked.  Performed at [facility], [place] [address], [place], [place] [address]     Radiology Studies:  CT CHEST ABDOMEN PELVIS W CONTRAST  Result Date: 03/17/2022 CLINICAL DATA:  Bacteremic, history of stones, positive UTI, history of cirrhosis EXAM: CT CHEST, ABDOMEN, AND PELVIS WITH CONTRAST TECHNIQUE: Multidetector CT imaging of the chest, abdomen and pelvis was performed following the standard protocol during bolus administration of intravenous contrast. RADIATION DOSE REDUCTION: This exam was performed according to the departmental dose-optimization program which includes automated exposure control, adjustment of the mA and/or kV according to patient size and/or use of iterative reconstruction technique. CONTRAST:  186m OMNIPAQUE IOHEXOL 300 MG/ML  SOLN COMPARISON:  CT 11/30/2021, 05/06/2017, 10/12/2021 FINDINGS: CT CHEST FINDINGS Cardiovascular: Heart size within normal limits. Thoracic aorta is nonaneurysmal. Minimal atherosclerotic calcification of the aorta and coronary arteries. Central pulmonary vasculature within normal limits. Mediastinum/Nodes: No enlarged mediastinal, hilar, or axillary lymph nodes. Thyroid gland, trachea, and esophagus  demonstrate no significant findings. Lungs/Pleura: Lungs are clear. No pleural effusion or pneumothorax. Musculoskeletal: Nondisplaced fractures of the lateral right sixth, seventh, and eighth ribs, which appear acute. There are subacute healing fractures of the anterolateral left fifth through seventh ribs. Additional old healed fractures of the right scapula and several right ribs. Chronic mild superior endplate compression fractures of T3 and T6. No suspicious lytic or sclerotic bone lesion is seen. CT ABDOMEN PELVIS FINDINGS Hepatobiliary: Significant interval enlargement of poorly defined hypoattenuating mass at the right hepatic dome comment now measuring approximately 10.4 x 4.5 x 6.6 cm (series 4, image 41), previously measured approximately 3.0 cm on 11/30/2021. Biliary dilatation is seen within the right hepatic lobe anteriorly. Cirrhotic liver morphology with nodular contour and caudate lobe hypertrophy. Gallbladder is moderately distended without hyperdense stone or inflammatory changes. Pancreas: Unremarkable. No pancreatic ductal dilatation or surrounding inflammatory changes. Spleen: Splenomegaly. Adrenals/Urinary Tract: Unremarkable adrenal glands. Multiple nonobstructing stones within the left kidney including a 1.1 cm stone within the renal pelvis. Subtle punctate focus of hyperdensity in the region of the right ureterovesical junction could represent a tiny stone (series 4, image 111). There is mild hydronephrosis of the  right kidney. Unchanged left renal cysts, which do not require follow-up imaging. Urinary bladder wall is slightly thickened. Stomach/Bowel: Stomach is within normal limits. No evidence of bowel wall thickening, distention, or inflammatory changes. Vascular/Lymphatic: Aortic atherosclerosis. No enlarged abdominal or pelvic lymph nodes. Reproductive: Prostate is unremarkable. Other: No free fluid. No abdominopelvic fluid collection. No pneumoperitoneum. No abdominal wall hernia.  Musculoskeletal: Prior left hip ORIF.  No acute bony findings. IMPRESSION: 1. Cirrhotic liver morphology with evidence of portal hypertension including splenomegaly. 2. Significant interval enlargement of poorly defined hypoattenuating mass at the right hepatic dome now measuring up to 10.4 x 4.5 x 6.6 cm, previously measured approximately 3.0 cm on 11/30/2021. Findings are concerning for hepatocellular carcinoma. Further evaluation with contrast-enhanced hepatic protocol MRI is recommended, preferably on a nonemergent, outpatient basis. 3. Mild right-sided hydronephrosis with a questionable punctate stone at the right ureterovesical junction. 4. Multiple nonobstructing left renal stones including a 1.1 cm stone within the renal pelvis. 5. Nondisplaced fractures of the lateral right sixth, seventh, and eighth ribs, which appear acute. Subacute healing fractures of the anterolateral left fifth through seventh ribs. No pneumothorax. Electronically Signed   By: Davina Poke D.O.   On: 03/17/2022 11:57   CT Head Wo Contrast  Result Date: 03/17/2022 CLINICAL DATA:  Encephalopathic.  Known cancer. EXAM: CT HEAD WITHOUT CONTRAST CT CERVICAL SPINE WITHOUT CONTRAST TECHNIQUE: Multidetector CT imaging of the head and cervical spine was performed following the standard protocol without intravenous contrast. Multiplanar CT image reconstructions of the cervical spine were also generated. RADIATION DOSE REDUCTION: This exam was performed according to the departmental dose-optimization program which includes automated exposure control, adjustment of the mA and/or kV according to patient size and/or use of iterative reconstruction technique. COMPARISON:  December 17, 2021 FINDINGS: CT HEAD FINDINGS Brain: No subdural, epidural, or subarachnoid hemorrhage. Volume loss, particularly frontally is similar in the interval. No mass effect or midline shift. Ventricles and sulci are stable. Cerebellum, brainstem, and basal cisterns are  within normal limits. No mass effect or midline shift. No acute cortical ischemia or infarct. Vascular: No hyperdense vessel or unexpected calcification. Skull: Normal. Negative for fracture or focal lesion. Sinuses/Orbits: No acute finding. Other: None. CT CERVICAL SPINE FINDINGS Alignment: 3 mm anterolisthesis of C6 versus C7, not significantly changed in the interval. No other malalignment. Skull base and vertebrae: No acute fracture. No primary bone lesion or focal pathologic process. Soft tissues and spinal canal: No prevertebral fluid or swelling. No visible canal hematoma. Disc levels: Multilevel degenerative disc disease. Mild facet degenerative changes. Upper chest: Negative. Other: No other abnormalities. IMPRESSION: 1. No acute intracranial abnormalities. Volume loss with prominence of the sulci. 2. 3 mm anterolisthesis of C6 versus C7 is nonacute. No fracture or acute traumatic malalignment. Degenerative changes. Electronically Signed   By: Dorise Bullion III M.D.   On: 03/17/2022 11:52   CT Cervical Spine Wo Contrast  Result Date: 03/17/2022 CLINICAL DATA:  Encephalopathic.  Known cancer. EXAM: CT HEAD WITHOUT CONTRAST CT CERVICAL SPINE WITHOUT CONTRAST TECHNIQUE: Multidetector CT imaging of the head and cervical spine was performed following the standard protocol without intravenous contrast. Multiplanar CT image reconstructions of the cervical spine were also generated. RADIATION DOSE REDUCTION: This exam was performed according to the departmental dose-optimization program which includes automated exposure control, adjustment of the mA and/or kV according to patient size and/or use of iterative reconstruction technique. COMPARISON:  December 17, 2021 FINDINGS: CT HEAD FINDINGS Brain: No subdural, epidural, or subarachnoid hemorrhage. Volume  loss, particularly frontally is similar in the interval. No mass effect or midline shift. Ventricles and sulci are stable. Cerebellum, brainstem, and basal  cisterns are within normal limits. No mass effect or midline shift. No acute cortical ischemia or infarct. Vascular: No hyperdense vessel or unexpected calcification. Skull: Normal. Negative for fracture or focal lesion. Sinuses/Orbits: No acute finding. Other: None. CT CERVICAL SPINE FINDINGS Alignment: 3 mm anterolisthesis of C6 versus C7, not significantly changed in the interval. No other malalignment. Skull base and vertebrae: No acute fracture. No primary bone lesion or focal pathologic process. Soft tissues and spinal canal: No prevertebral fluid or swelling. No visible canal hematoma. Disc levels: Multilevel degenerative disc disease. Mild facet degenerative changes. Upper chest: Negative. Other: No other abnormalities. IMPRESSION: 1. No acute intracranial abnormalities. Volume loss with prominence of the sulci. 2. 3 mm anterolisthesis of C6 versus C7 is nonacute. No fracture or acute traumatic malalignment. Degenerative changes. Electronically Signed   By: Dorise Bullion III M.D.   On: 03/17/2022 11:52   DG Chest Portable 1 View  Result Date: 03/17/2022 CLINICAL DATA:  Acute mental status change.  Fever. EXAM: PORTABLE CHEST 1 VIEW COMPARISON:  December 17, 2021 FINDINGS: The heart, hila, and mediastinum are unchanged. No pneumothorax. No nodules or masses. No focal infiltrate to explain the patient's fever. IMPRESSION: No active disease. Electronically Signed   By: Dorise Bullion III M.D.   On: 03/17/2022 07:48    Scheduled Meds:    feeding supplement  237 mL Oral BID BM   lactase  3,000 Units Oral QHS   lamoTRIgine  150 mg Oral BID   latanoprost  1 drop Both Eyes QHS   mesalamine  2.4 g Oral Daily   pantoprazole  40 mg Oral BID   rifaximin  550 mg Oral BID   sertraline  200 mg Oral Q breakfast   tamsulosin  0.4 mg Oral QHS   traZODone  150 mg Oral QHS    Continuous Infusions:    cefTRIAXone (ROCEPHIN)  IV Stopped (03/17/22 0957)   lactated ringers 125 mL/hr at 03/18/22 0754    vancomycin 1,250 mg (03/17/22 2335)     LOS: 1 day     Vernell Leep, MD,  FACP, FHM, [facility], [facility], [place]   To contact the attending provider between 7A-7P or the covering provider during after hours 7P-7A, please log into the web site [email or web address] and access using universal [place] password for that web site. If you do not have the password, please call the hospital operator.  03/18/2022, 9:48 AM

## 2022-03-19 ENCOUNTER — Encounter (HOSPITAL_COMMUNITY): Payer: Self-pay | Admitting: Urology

## 2022-03-19 DIAGNOSIS — K7460 Unspecified cirrhosis of liver: Secondary | ICD-10-CM | POA: Diagnosis not present

## 2022-03-19 DIAGNOSIS — N390 Urinary tract infection, site not specified: Secondary | ICD-10-CM | POA: Diagnosis not present

## 2022-03-19 DIAGNOSIS — A419 Sepsis, unspecified organism: Secondary | ICD-10-CM | POA: Diagnosis not present

## 2022-03-19 LAB — CBC
HCT: 30 % — ABNORMAL LOW (ref 39.0–52.0)
Hemoglobin: 9.9 g/dL — ABNORMAL LOW (ref 13.0–17.0)
MCH: 30 pg (ref 26.0–34.0)
MCHC: 33 g/dL (ref 30.0–36.0)
MCV: 90.9 fL (ref 80.0–100.0)
Platelets: 85 10*3/uL — ABNORMAL LOW (ref 150–400)
RBC: 3.3 MIL/uL — ABNORMAL LOW (ref 4.22–5.81)
RDW: 14.6 % (ref 11.5–15.5)
WBC: 4 10*3/uL (ref 4.0–10.5)
nRBC: 0 % (ref 0.0–0.2)

## 2022-03-19 LAB — MAGNESIUM: Magnesium: 1.7 mg/dL (ref 1.7–2.4)

## 2022-03-19 LAB — COMPREHENSIVE METABOLIC PANEL
ALT: 26 U/L (ref 0–44)
AST: 39 U/L (ref 15–41)
Albumin: 2.8 g/dL — ABNORMAL LOW (ref 3.5–5.0)
Alkaline Phosphatase: 287 U/L — ABNORMAL HIGH (ref 38–126)
Anion gap: 7 (ref 5–15)
BUN: 11 mg/dL (ref 6–20)
CO2: 24 mmol/L (ref 22–32)
Calcium: 8.4 mg/dL — ABNORMAL LOW (ref 8.9–10.3)
Chloride: 109 mmol/L (ref 98–111)
Creatinine, Ser: 0.59 mg/dL — ABNORMAL LOW (ref 0.61–1.24)
GFR, Estimated: >60 mL/min (ref >60)
Glucose, Bld: 87 mg/dL (ref 70–99)
Potassium: 3.3 mmol/L — ABNORMAL LOW (ref 3.5–5.1)
Sodium: 140 mmol/L (ref 135–145)
Total Bilirubin: 1.3 mg/dL — ABNORMAL HIGH (ref 0.3–1.2)
Total Protein: 5.4 g/dL — ABNORMAL LOW (ref 6.5–8.1)

## 2022-03-19 LAB — GLUCOSE, CAPILLARY
Glucose-Capillary: 90 mg/dL (ref 70–99)
Glucose-Capillary: 88 mg/dL (ref 70–99)
Glucose-Capillary: 117 mg/dL — ABNORMAL HIGH (ref 70–99)
Glucose-Capillary: 84 mg/dL (ref 70–99)

## 2022-03-19 MED ORDER — ADULT MULTIVITAMIN W/MINERALS CH
1.0000 | ORAL_TABLET | Freq: Every day | ORAL | Status: DC
  Administered 2022-03-19 – 2022-03-21 (×3): 1 via ORAL
  Filled 2022-03-19 (×3): qty 1

## 2022-03-19 MED ORDER — POTASSIUM CHLORIDE CRYS ER 20 MEQ PO TBCR
30.0000 meq | EXTENDED_RELEASE_TABLET | ORAL | Status: AC
  Administered 2022-03-19 (×2): 30 meq via ORAL
  Filled 2022-03-19 (×2): qty 1

## 2022-03-19 MED ORDER — LIDOCAINE 5 % EX PTCH
1.0000 | MEDICATED_PATCH | CUTANEOUS | Status: AC
  Administered 2022-03-19: 1 via TRANSDERMAL
  Filled 2022-03-19: qty 1

## 2022-03-19 MED ORDER — MAGNESIUM SULFATE 2 GM/50ML IV SOLN
2.0000 g | Freq: Once | INTRAVENOUS | Status: AC
  Administered 2022-03-19: 2 g via INTRAVENOUS
  Filled 2022-03-19: qty 50

## 2022-03-19 NOTE — Progress Notes (Signed)
Mobility Specialist - Progress Note   03/19/22 1528  Mobility  HOB Elevated/Bed Position Self regulated  Activity Ambulated with assistance in hallway  Range of Motion/Exercises Active  Level of Assistance Contact guard assist, steadying assist  Assistive Device Front wheel walker  Distance Ambulated (ft) 1100 ft  Activity Response Tolerated well  Transport method Ambulatory  $Mobility charge 1 Mobility   Pt received in bed and agreeable to mobility. No complaints during ambulation. Pt to bed after session with all needs met.     [facility]

## 2022-03-19 NOTE — Discharge Instructions (Signed)

## 2022-03-19 NOTE — Evaluation (Signed)
Physical Therapy Evaluation Patient Details Name: [name] [name] MRN: [id number] DOB: May 22, 1961 Today's Date: 03/19/2022  History of Present Illness  Pt is a 61 year old male admitted for sepsis secondary to UTI and s/p Cystoscopy, right retrograde pyelogram with intraoperative interpretation, right ureteroscopy on 03/18/22.  PMHx significant for but not limited to: recent IM nail due to L hip fx and rib fractures in July, seizures, Crohn's, anxiety, DM, liver cirrhosis, HTN, recurrent squamous cell carcinoma of the skin, s/p several Mohs surgeries and curative radiotherapy  Clinical Impression  Pt admitted with above diagnosis.  Pt currently with functional limitations due to the deficits listed below (see PT Problem List). Pt will benefit from skilled PT to increase their independence and safety with mobility to allow discharge to the venue listed below.  Pt assisted with ambulating in hallway.  Pt with recent L IM nail due to fall and had HHPT and most recently working with OPPT.  Pt also with chronic balance issues and falls per spouse.  Spouse anticipates pt will return home and to OPPT setting upon d/c.        Recommendations for follow up therapy are one component of a multi-disciplinary discharge planning process, led by the attending physician.  Recommendations may be updated based on patient status, additional functional criteria and insurance authorization.  Follow Up Recommendations Outpatient PT (recommend continuing OPPT)      Assistance Recommended at Discharge PRN  Patient can return home with the following  Help with stairs or ramp for entrance;Assistance with cooking/housework    Equipment Recommendations None recommended by PT  Recommendations for Other Services       Functional Status Assessment Patient has had a recent decline in their functional status and demonstrates the ability to make significant improvements in function in a reasonable and predictable amount of  time.     Precautions / Restrictions Precautions Precautions: Fall      Mobility  Bed Mobility Overal bed mobility: Needs Assistance Bed Mobility: Supine to Sit     Supine to sit: Supervision, HOB elevated     General bed mobility comments: cues for technique    Transfers Overall transfer level: Needs assistance Equipment used: Rolling walker (2 wheels) Transfers: Sit to/from Stand Sit to Stand: Min guard           General transfer comment: min/guard for safety however no physical assist required    Ambulation/Gait Ambulation/Gait assistance: Min guard Gait Distance (Feet): 800 Feet Assistive device: Rolling walker (2 wheels) Gait Pattern/deviations: Step-through pattern, Decreased stride length, Narrow base of support       General Gait Details: pt able to self correct with RW with a couple challenges in hallway, min/guard for safety  Stairs            Wheelchair Mobility    Modified Rankin (Stroke Patients Only)       Balance Overall balance assessment: History of Falls, Mild deficits observed, not formally tested                                           Pertinent Vitals/Pain Pain Assessment Pain Assessment: Faces Faces Pain Scale: Hurts little more Pain Location: left arm IV site Pain Descriptors / Indicators:  (RN notified and disconnected line) Pain Intervention(s): Repositioned, Ice applied    Home Living Family/patient expects to be discharged to:: Private residence Living Arrangements:  Spouse/significant other Available Help at Discharge: Family;Friend(s);Available PRN/intermittently Type of Home: House Home Access: Stairs to enter Entrance Stairs-Rails: Left Entrance Stairs-Number of Steps: 2 Alternate Level Stairs-Number of Steps: flight Home Layout: Two level;Able to live on main level with bedroom/bathroom Home Equipment: Shower seat - built in;Grab bars - tub/shower;Wheelchair - Publishing copy (2  wheels);Cane - single point;Other (comment) Additional Comments: tub shower downstairs with built in bench, walk in shower upstairs    Prior Function Prior Level of Function : Needs assist       Physical Assist : Mobility (physical)     Mobility Comments: using rollator for safety due to frequent falls, was working with OPPT prior to admission       Hand Dominance        Extremity/Trunk Assessment        Lower Extremity Assessment Lower Extremity Assessment: Generalized weakness    Cervical / Trunk Assessment Cervical / Trunk Assessment: Normal  Communication   Communication: No difficulties  Cognition Arousal/Alertness: Awake/alert Behavior During Therapy: WFL for tasks assessed/performed Overall Cognitive Status: Impaired/Different from baseline                                 General Comments: admitted with confusion, UTI, does not appear at baseline yet but pleasant and follows commands        General Comments      Exercises     Assessment/Plan    PT Assessment Patient needs continued PT services  PT Problem List Decreased strength;Decreased activity tolerance;Decreased balance;Decreased mobility       PT Treatment Interventions Gait training;DME instruction;Therapeutic exercise;Functional mobility training;Therapeutic activities;Patient/family education;Balance training;Stair training    PT Goals (Current goals can be found in the Care Plan section)  Acute Rehab PT Goals PT Goal Formulation: With patient Time For Goal Achievement: 04/02/22 Potential to Achieve Goals: Good    Frequency Min 3X/week     Co-evaluation               AM-PAC PT "6 Clicks" Mobility  Outcome Measure Help needed turning from your back to your side while in a flat bed without using bedrails?: A Little Help needed moving from lying on your back to sitting on the side of a flat bed without using bedrails?: A Little Help needed moving to and from a bed  to a chair (including a wheelchair)?: A Little Help needed standing up from a chair using your arms (e.g., wheelchair or bedside chair)?: A Little Help needed to walk in hospital room?: A Little Help needed climbing 3-5 steps with a railing? : A Little 6 Click Score: 18    End of Session Equipment Utilized During Treatment: Gait belt Activity Tolerance: Patient tolerated treatment well Patient left: in chair;with call bell/phone within reach;with family/visitor present (aware to call for assist out of recliner, safety to prevent falls)   PT Visit Diagnosis: Difficulty in walking, not elsewhere classified (R26.2)    Time: 2263-3354 PT Time Calculation (min) (ACUTE ONLY): 25 min   Charges:   PT Evaluation $PT Eval Low Complexity: 1 Low PT Treatments $Gait Training: 8-22 mins      Jannette Spanner PT, DPT Physical Therapist Acute Rehabilitation Services Preferred contact method: Secure Chat Weekend Pager Only: [phone] Office: [place] 03/19/2022, 12:14 PM

## 2022-03-19 NOTE — Progress Notes (Signed)
Initial Nutrition Assessment  DOCUMENTATION CODES:   Severe malnutrition in context of chronic illness  INTERVENTION:   -Ensure Plus High Protein po BID, each supplement provides 350 kcal and 20 grams of protein.   -Multivitamin with minerals daily  -Placed "High Calorie, High Protein" handout in AVS  NUTRITION DIAGNOSIS:   Severe Malnutrition related to chronic illness (h/o SCC of right cheek, Crohn's disease) as evidenced by percent weight loss, severe fat depletion, severe muscle depletion.  GOAL:   Patient will meet greater than or equal to 90% of their needs  MONITOR:   PO intake, Supplement acceptance, Labs, Weight trends, I & O's  REASON FOR ASSESSMENT:   Consult Assessment of nutrition requirement/status  ASSESSMENT:   61 year old male with PMH of recurrent squamous cell carcinoma of the skin, s/p several Mohs surgeries and curative radiotherapy, type II DM, HTN, HLD, GERD, liver cirrhosis due to NASH, Crohn's disease, pathological left hip fracture, OSA on CPAP, staphylococcal bacteremia with sepsis, presented to the ED 03/17/2022 with complaints of altered mental status, urinary frequency, dysuria, right flank pain and frontal headache.  Admitted for sepsis, POA due to acute cystitis complicating right UPJ obstructing stone.  9/25: s/p cystoscopy  Patient in room, wife at bedside. Report pt had started to eat a bit better at home. Was previously only consuming ~ 1 meal day and had started to eat more throughout the day. Tries to drink 2 Ensures daily but sometimes he doesn't drink them. Pt sensitive to lactose and was reporting he did not want dairy products limited on his diet as he sometimes drinks milkshakes. States his doctor told him to have as many milkshakes as he can tolerate for the calories.  Pt takes folic acid, calcium and vitamin D at home.  Pt did not eat any breakfast this morning. Ate ~20% of lunch. Ensure supplements have been ordered. Will add daily  MVI.  Per pt's wife, pt weighed ~165 lb in June 2023.  This would be a 12% wt loss x 3 months, significant for time frame.  Medications: Lactaid, KLOR-CON, IV Mg sulfate  Labs reviewed:  CBGs: 88-229 Low K  NUTRITION - FOCUSED PHYSICAL EXAM:  Flowsheet Row Most Recent Value  Orbital Region Severe depletion  Upper Arm Region Severe depletion  Thoracic and Lumbar Region Severe depletion  Buccal Region Severe depletion  Temple Region Severe depletion  Clavicle Bone Region Severe depletion  Clavicle and Acromion Bone Region Severe depletion  Scapular Bone Region Severe depletion  Dorsal Hand Severe depletion  Patellar Region Severe depletion  Anterior Thigh Region Severe depletion  Posterior Calf Region Severe depletion  Edema (RD Assessment) None  Hair Reviewed  [no hair]  Eyes Reviewed  Mouth Reviewed  [no bottom teeth, poor dentition up top]  Skin Reviewed  [redness]  Nails Reviewed       Diet Order:   Diet Order             Diet regular Room service appropriate? Yes; Fluid consistency: Thin  Diet effective now                   EDUCATION NEEDS:   Education needs have been addressed  Skin:  Skin Assessment: Reviewed RN Assessment  Last BM:  9/25 -type 7  Height:   Ht Readings from Last 1 Encounters:  03/18/22 5' 11"  (1.803 m)    Weight:   Wt Readings from Last 1 Encounters:  03/18/22 65.8 kg    BMI:  Body mass  index is 20.22 kg/m.  Estimated Nutritional Needs:   Kcal:  2100-2300  Protein:  100-110g  Fluid:  2.1L/day  Clayton Bibles, MS, RD, LDN Inpatient Clinical Dietitian Contact information available via Amion

## 2022-03-19 NOTE — Progress Notes (Signed)
PROGRESS NOTE   [name] [name]  ESP:[id number]    DOB: 06-Dec-1960    DOA: 03/17/2022  PCP: [name], MD   I have briefly reviewed patients previous medical records in [facility].  Chief Complaint  Patient presents with   Altered Mental Status    Brief Narrative:  61 year old male with PMH of recurrent squamous cell carcinoma of the skin, s/p several Mohs surgeries and curative radiotherapy, type II DM, HTN, HLD, GERD, liver cirrhosis due to NASH, Crohn's disease, pathological left hip fracture, OSA on CPAP, staphylococcal bacteremia with sepsis, presented to the ED 03/17/2022 with complaints of altered mental status, urinary frequency, dysuria, right flank pain and frontal headache.  Admitted for sepsis, POA due to acute cystitis complicating right UPJ obstructing stone.  Urology consulted s/p cystoscopy 9/25.  Improving.   Assessment & Plan:  Principal Problem:   Sepsis secondary to UTI [facility]) Active Problems:   DM2 (diabetes mellitus, type 2) ([place])   CROHN'S DISEASE, LARGE AND SMALL INTESTINES   Depression   GERD   Complex partial seizure ([place])   Hyperbilirubinemia   Hydronephrosis of right kidney   Nephrolith   Sepsis secondary to UTI POA [facility]), complicating right UPJ stone Presented with fever of 101.8, tachycardia and leukocytosis.  SARS coronavirus 2 by RT-PCR, flu panel negative.  Blood cultures x2: NTD.  Lactate had peaked to 3.2, down to 2.8 (some of this lactate elevation may be due to liver cirrhosis).  Chest x-ray negative.  Urine culture: Pending.  Continue empirically started IV ceftriaxone.  As communicated with pharmacy yesterday, since staphylococcal bacteremia was remote, vancomycin discontinued.  Management of nephrolithiasis per urology.  Clinically improved.  Defervesced.  No leukocytosis.  Day 3 IV antibiotics.  Hypokalemia and hypomagnesemia: Replaced.  Follow in AM.    Hydronephrosis of right kidney   Nephrolith of the R UPJ Urology consultation  appreciated.  Questionable small distal ureteral stone with minimal hydronephrosis.  Urine culture, tamsulosin, antibiotics as above.  S/p cystoscopy 9/26 and patient passed right ureteral calculus.     DM2 (diabetes mellitus, type 2) (HCC) Carbohydrate modified diet. CBG monitoring before meals and bedtime.  Good inpatient control.     CROHN'S DISEASE, LARGE AND SMALL INTESTINES Continue mesalamine 2.4 g p.o. twice daily.  Patient follows with providers at [facility].     Depression Continue sertraline 200 mg p.o. daily. Continue trazodone 150 mg p.o. bedtime.     GERD Continue PPI.     Complex partial seizure (HCC) Continue Lamictal 150 mg p.o. twice daily.     Hyperbilirubinemia/hyperammonemia Requestion not to take lactulose due to GI side effects. Xifaxan 550 mg p.o. twice daily ordered.  Anemia, possibly of chronic disease, cirrhosis: Stable compared to prior numbers.  Thrombocytopenia: Platelet counts fluctuating but appears close to his baseline.  Acute metabolic encephalopathy: Likely multifactorial due to infectious etiology, pain, hepatic encephalopathy etc.  CT head and neck without acute findings.  Coherent.  Appears to have resolved.  Recurrent squamous cell cancer of skin: Follows with outpatient dermatology.  Additional CT abdomen findings 9/24: Cirrhosis of liver with portal hypertension and splenomegaly: Liver mass 10.4 x 4.5 x 6.6 cm at the right hepatic dome, concerning for hepatocellular carcinoma, will need contrast-enhanced hepatic protocol MRI-likely outpatient.  Will check tumor markers. Nondisplaced multiple rib fractures on the right and left.  No pneumothorax. Reports history of fall a month ago. Above will need to be closely followed and evaluated.  Body mass index is  20.22 kg/m. Consulted Firefighter.  Nutritional Status Nutrition Problem: Severe Malnutrition Etiology: chronic illness (h/o SCC of right cheek, Crohn's  disease) Signs/Symptoms: percent weight loss, severe fat depletion, severe muscle depletion Interventions: Ensure Enlive (each supplement provides 350kcal and 20 grams of protein), MVI, Education  Pressure Ulcer:     DVT prophylaxis: SCDs Start: 03/17/22 1330     Code Status: Full Code:  Family Communication: None at bedside. Disposition:  Status is: Inpatient Remains inpatient appropriate because: IV antibiotics     Consultants:   Urology  Procedures:   Cystoscopy 9/25  Antimicrobials:   IV ceftriaxone 9/24 > IV vancomycin 9/24 - 9/25   Subjective:  Overall somewhat better but still feeling poorly.  Left upper extremity painful swelling from IV infiltration.  Nursing attempting to place new IV line on the opposite arm.  Objective:   Vitals:   03/18/22 1432 03/18/22 2227 03/19/22 0602 03/19/22 1408  BP: (!) 142/80 106/66 139/71 (!) 111/55  Pulse: 70 81 67 76  Resp: 16 18 18 18   Temp: 98.6 F (37 C) 97.9 F (36.6 C) 98.3 F (36.8 C) 98.2 F (36.8 C)  TempSrc: Oral Oral Oral Oral  SpO2: 97% 97% 98% 98%  Weight:      Height:        General exam: Middle-age male, looks older than stated age, chronically ill looking and frail, seen ambulating comfortably and steadily with mobility specialist and ambulated 1100 feet with front wheel walker.  Subsequently sitting comfortably on reclining chair without distress. Respiratory system: Clear to auscultation.  No increased work of breathing.  Reproducible chest wall tenderness bilaterally anteriorly, more so on the left. Cardiovascular system: S1 & S2 heard, RRR. No JVD, murmurs, rubs, gallops or clicks. No pedal edema.  Telemetry personally reviewed: Sinus rhythm. Gastrointestinal system: Abdomen is nondistended, soft and minimal diffuse tenderness (chronic) without rigidity, guarding or rebound. No organomegaly or masses felt. Normal bowel sounds heard. Central nervous system: Alert and oriented. No focal neurological  deficits. Extremities: Symmetric 5 x 5 power.  Left forearm with mild swelling related to recent IV infiltration.  Neurovascular bundle intact. Skin: Extensive skin changes, hypopigmented and other areas on bilateral upper extremities.  On left upper extremity with recent treatment by his dermatologist. Psychiatry: Judgement and insight intact. Mood & affect appropriate.     Data Reviewed:   I have personally reviewed following labs and imaging studies   CBC: Recent Labs  Lab 03/17/22 0904 03/18/22 0525 03/19/22 0616  WBC 12.7* 4.9 4.0  NEUTROABS 11.6*  --   --   HGB 11.4* 9.3* 9.9*  HCT 34.2* 29.7* 30.0*  MCV 91.7 94.6 90.9  PLT 109* 74* 85*    Basic Metabolic Panel: Recent Labs  Lab 03/17/22 0833 03/18/22 0525 03/19/22 0616  NA 139 140 140  K 3.9 3.6 3.3*  CL 108 111 109  CO2 23 23 24   GLUCOSE 103* 82 87  BUN 10 11 11   CREATININE 0.75 0.61 0.59*  CALCIUM 8.9 8.2* 8.4*  MG  --   --  1.7    Liver Function Tests: Recent Labs  Lab 03/17/22 0833 03/18/22 0525 03/19/22 0616  AST 44* 47* 39  ALT 27 26 26   ALKPHOS 328* 270* 287*  BILITOT 1.4* 1.2 1.3*  PROT 6.4* 5.0* 5.4*  ALBUMIN 3.4* 2.5* 2.8*    CBG: Recent Labs  Lab 03/19/22 0739 03/19/22 1125 03/19/22 1644  GLUCAP 90 88 117*    Microbiology Studies:   Recent  Results (from the past 240 hour(s))  Resp Panel by RT-PCR (Flu A&B, Covid) Anterior Nasal Swab     Status: None   Collection Time: 03/17/22  8:19 AM   Specimen: Anterior Nasal Swab  Result Value Ref Range Status   SARS Coronavirus 2 by RT PCR NEGATIVE NEGATIVE Final    Comment: (NOTE) SARS-CoV-2 target nucleic acids are NOT DETECTED.  The SARS-CoV-2 RNA is generally detectable in upper respiratory specimens during the acute phase of infection. The lowest concentration of SARS-CoV-2 viral copies this assay can detect is 138 copies/mL. A negative result does not preclude SARS-Cov-2 infection and should not be used as the sole basis for  treatment or other patient management decisions. A negative result may occur with  improper specimen collection/handling, submission of specimen other than nasopharyngeal swab, presence of viral mutation(s) within the areas targeted by this assay, and inadequate number of viral copies(<138 copies/mL). A negative result must be combined with clinical observations, patient history, and epidemiological information. The expected result is Negative.  Fact Sheet for Patients:  EntrepreneurPulse.com.au  Fact Sheet for Healthcare Providers:  IncredibleEmployment.be  This test is no t yet approved or cleared by the [place] FDA and  has been authorized for detection and/or diagnosis of SARS-CoV-2 by FDA under an Emergency Use Authorization (EUA). This EUA will remain  in effect (meaning this test can be used) for the duration of the COVID-19 declaration under Section 564(b)(1) of the Act, 21 U.S.C.section 360bbb-3(b)(1), unless the authorization is terminated  or revoked sooner.       Influenza A by PCR NEGATIVE NEGATIVE Final   Influenza B by PCR NEGATIVE NEGATIVE Final    Comment: (NOTE) The Xpert Xpress SARS-CoV-2/FLU/RSV plus assay is intended as an aid in the diagnosis of influenza from Nasopharyngeal swab specimens and should not be used as a sole basis for treatment. Nasal washings and aspirates are unacceptable for Xpert Xpress SARS-CoV-2/FLU/RSV testing.  Fact Sheet for Patients: EntrepreneurPulse.com.au  Fact Sheet for Healthcare Providers: IncredibleEmployment.be  This test is not yet approved or cleared by the [place] FDA and has been authorized for detection and/or diagnosis of SARS-CoV-2 by FDA under an Emergency Use Authorization (EUA). This EUA will remain in effect (meaning this test can be used) for the duration of the COVID-19 declaration under Section 564(b)(1) of the Act, 21  U.S.C. section 360bbb-3(b)(1), unless the authorization is terminated or revoked.  Performed at [facility], [place] [address], [place], [place] [address]   Blood culture (routine x 2)     Status: None (Preliminary result)   Collection Time: 03/17/22  8:33 AM   Specimen: BLOOD  Result Value Ref Range Status   Specimen Description   Final    BLOOD BLOOD RIGHT HAND Performed at [facility] [address], [place], [place] [address]    Special Requests   Final    BOTTLES DRAWN AEROBIC AND ANAEROBIC Blood Culture results may not be optimal due to an inadequate volume of blood received in culture bottles Performed at [facility] [address], [place], [place] [address]    Culture   Final    NO GROWTH 2 DAYS Performed at [facility] [address] [place], [place] [address]    Report Status PENDING  Incomplete  Blood culture (routine x 2)     Status: None (Preliminary result)   Collection Time: 03/17/22  1:21 PM   Specimen: BLOOD  Result Value Ref  Range Status   Specimen Description   Final    BLOOD LEFT ANTECUBITAL Performed at [facility] [address], [place], [place] [address]    Special Requests   Final    BOTTLES DRAWN AEROBIC AND ANAEROBIC Blood Culture results may not be optimal due to an inadequate volume of blood received in culture bottles Performed at [facility] [address], [place], [place] [address]    Culture   Final    NO GROWTH 2 DAYS Performed at [facility] [address] [place], [place] [address]    Report Status PENDING  Incomplete    Radiology Studies:  DG C-Arm 1-60 Min-No Report  Result Date: 03/18/2022 Fluoroscopy was utilized by the requesting physician.  No radiographic interpretation.    Scheduled Meds:    feeding supplement  237 mL Oral BID BM   lactase  3,000 Units Oral QHS   lamoTRIgine  150 mg Oral BID    latanoprost  1 drop Both Eyes QHS   mesalamine  2.4 g Oral Daily   multivitamin with minerals  1 tablet Oral Daily   pantoprazole  40 mg Oral BID   rifaximin  550 mg Oral BID   sertraline  200 mg Oral Q breakfast   tamsulosin  0.4 mg Oral QHS   traZODone  150 mg Oral QHS    Continuous Infusions:    cefTRIAXone (ROCEPHIN)  IV 200 mL/hr at 03/19/22 1206     LOS: 2 days     Vernell Leep, MD,  FACP, [facility], [facility], [facility], [place]   To contact the attending provider between 7A-7P or the covering provider during after hours 7P-7A, please log into the web site [email or web address] and access using universal [place] password for that web site. If you do not have the password, please call the hospital operator.  03/19/2022, 5:20 PM

## 2022-03-19 NOTE — TOC Initial Note (Signed)
Transition of Care [facility]) - Initial/Assessment Note    Patient Details  Name: [name] [name] MRN: [id number] Date of Birth: 1960-10-20  Transition of Care [facility]) CM/SW Contact:    Leeroy Cha, RN Phone Number: 03/19/2022, 10:30 AM  Clinical Narrative:                  Transition of Care [facility]) Screening Note   Patient Details  Name: [name] [name] Date of Birth: 05-Sep-1960   Transition of Care [facility]) CM/SW Contact:    Leeroy Cha, RN Phone Number: 03/19/2022, 10:30 AM    Transition of Care Department [facility]) has reviewed patient and no TOC needs have been identified at this time. We will continue to monitor patient advancement through interdisciplinary progression rounds. If new patient transition needs arise, please place a TOC consult.    Expected Discharge Plan: Home/Self Care Barriers to Discharge: Continued Medical Work up   Patient Goals and CMS Choice Patient states their goals for this hospitalization and ongoing recovery are:: to go home CMS Medicare.gov Compare Post Acute Care list provided to:: Patient Choice offered to / list presented to : Patient  Expected Discharge Plan and Services Expected Discharge Plan: Home/Self Care   Discharge Planning Services: CM Consult   Living arrangements for the past 2 months: Single Family Home                                      Prior Living Arrangements/Services Living arrangements for the past 2 months: Single Family Home Lives with:: Spouse Patient language and need for interpreter reviewed:: Yes Do you feel safe going back to the place where you live?: Yes            Criminal Activity/Legal Involvement Pertinent to Current Situation/Hospitalization: No - Comment as needed  Activities of Daily Living Home Assistive Devices/Equipment: Environmental consultant (specify type) (rollator) ADL Screening (condition at time of admission) Patient's cognitive ability adequate to safely complete daily activities?:  Yes Is the patient deaf or have difficulty hearing?: No Does the patient have difficulty seeing, even when wearing glasses/contacts?: No Does the patient have difficulty concentrating, remembering, or making decisions?: No Patient able to express need for assistance with ADLs?: Yes Does the patient have difficulty dressing or bathing?: Yes Independently performs ADLs?: Yes (appropriate for developmental age) Does the patient have difficulty walking or climbing stairs?: Yes Weakness of Legs: Both Weakness of Arms/Hands: Both  Permission Sought/Granted                  Emotional Assessment Appearance:: Appears stated age Attitude/Demeanor/Rapport: Engaged Affect (typically observed): Calm Orientation: : Oriented to Self, Oriented to Place, Oriented to  Time, Oriented to Situation Alcohol / Substance Use: Never Used Psych Involvement: No (comment)  Admission diagnosis:  Sepsis secondary to UTI ([place]) [A41.9, N39.0] Closed fracture of multiple ribs of right side, initial encounter [S22.41XA] Urinary tract infection without hematuria, site unspecified [N39.0] Cirrhosis of liver without ascites, unspecified hepatic cirrhosis type ([place]) [K74.60] Sepsis, due to unspecified organism, unspecified whether acute organ dysfunction present [facility]) [A41.9] Patient Active Problem List   Diagnosis Date Noted   Sepsis secondary to UTI ([place]) 03/17/2022   Hyperbilirubinemia 03/17/2022   Hydronephrosis of right kidney 03/17/2022   Nephrolith 03/17/2022   Protein-calorie malnutrition, severe 12/19/2021   Pathological fracture of left hip due to osteoporosis ([place]) 12/18/2021   Closed nondisplaced intertrochanteric fracture of  left femur, initial encounter ([place]) 12/17/2021   Rib fracture 12/17/2021   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 05/11/2021   Liver cirrhosis secondary to NASH with liver mass     GERD (gastroesophageal reflux disease)    Pancytopenia ([place])    Osteomyelitis of jaw  12/29/2020   Acute osteomyelitis of mandible 12/26/2020   S/P inguinal hernia repair 01/14/2020   At high risk for falls    S/P shoulder surgery 03/05/2018   Excessive daytime sleepiness 02/18/2018   Crohn's disease of colon with rectal bleeding ([place]) 02/18/2018   Iron deficiency anemia due to chronic blood loss and thrombocytopenia  02/18/2018   Iron deficiency anemia due to sideropenic dysphagia 02/18/2018   Renal calculus, left 02/02/2018   History of sepsis 12/16/2017   Renal cyst, acquired, left 07/08/2017   Frequent falls 07/08/2017   Renal lesion 05/28/2017   MSSA bacteremia 05/06/2017   Depression 04/30/2016   Memory loss 04/30/2016   OSA on CPAP 10/31/2015   Nonepileptic episode ([place]) 04/25/2015   Seizure ([place]) 04/04/2013   DOE (dyspnea on exertion) 03/12/2013   Complex partial seizure ([place]) 04/19/2012   DM2 (diabetes mellitus, type 2) ([place]) 09/25/2011   OTHER DYSPHAGIA 03/27/2009   GERD 12/07/2007   EXTERNAL HEMORRHOIDS 09/11/2007   CROHN'S DISEASE, LARGE AND SMALL INTESTINES 09/11/2007   OSTEOPOROSIS 09/11/2007   PCP:  [name], MD Pharmacy:   [id number] [place] [id number]: [phone]: [phone]    Social Determinants of Health (SDOH) Interventions Housing Interventions: Intervention Not Indicated  Readmission Risk Interventions   No data to display

## 2022-03-20 DIAGNOSIS — K7460 Unspecified cirrhosis of liver: Secondary | ICD-10-CM

## 2022-03-20 DIAGNOSIS — G40209 Localization-related (focal) (partial) symptomatic epilepsy and epileptic syndromes with complex partial seizures, not intractable, without status epilepticus: Secondary | ICD-10-CM

## 2022-03-20 DIAGNOSIS — K219 Gastro-esophageal reflux disease without esophagitis: Secondary | ICD-10-CM

## 2022-03-20 DIAGNOSIS — S2241XA Multiple fractures of ribs, right side, initial encounter for closed fracture: Secondary | ICD-10-CM

## 2022-03-20 DIAGNOSIS — N390 Urinary tract infection, site not specified: Secondary | ICD-10-CM

## 2022-03-20 DIAGNOSIS — A419 Sepsis, unspecified organism: Secondary | ICD-10-CM | POA: Diagnosis not present

## 2022-03-20 DIAGNOSIS — E119 Type 2 diabetes mellitus without complications: Secondary | ICD-10-CM

## 2022-03-20 DIAGNOSIS — F321 Major depressive disorder, single episode, moderate: Secondary | ICD-10-CM

## 2022-03-20 LAB — COMPREHENSIVE METABOLIC PANEL
ALT: 27 U/L (ref 0–44)
AST: 38 U/L (ref 15–41)
Albumin: 2.7 g/dL — ABNORMAL LOW (ref 3.5–5.0)
Alkaline Phosphatase: 288 U/L — ABNORMAL HIGH (ref 38–126)
Anion gap: 6 (ref 5–15)
BUN: 9 mg/dL (ref 6–20)
CO2: 26 mmol/L (ref 22–32)
Calcium: 8.4 mg/dL — ABNORMAL LOW (ref 8.9–10.3)
Chloride: 111 mmol/L (ref 98–111)
Creatinine, Ser: 0.56 mg/dL — ABNORMAL LOW (ref 0.61–1.24)
GFR, Estimated: >60 mL/min (ref >60)
Glucose, Bld: 79 mg/dL (ref 70–99)
Potassium: 3.8 mmol/L (ref 3.5–5.1)
Sodium: 143 mmol/L (ref 135–145)
Total Bilirubin: 1.1 mg/dL (ref 0.3–1.2)
Total Protein: 5.2 g/dL — ABNORMAL LOW (ref 6.5–8.1)

## 2022-03-20 LAB — CBC
HCT: 29.4 % — ABNORMAL LOW (ref 39.0–52.0)
Hemoglobin: 9.7 g/dL — ABNORMAL LOW (ref 13.0–17.0)
MCH: 30.3 pg (ref 26.0–34.0)
MCHC: 33 g/dL (ref 30.0–36.0)
MCV: 91.9 fL (ref 80.0–100.0)
Platelets: 100 10*3/uL — ABNORMAL LOW (ref 150–400)
RBC: 3.2 MIL/uL — ABNORMAL LOW (ref 4.22–5.81)
RDW: 14.7 % (ref 11.5–15.5)
WBC: 3.2 10*3/uL — ABNORMAL LOW (ref 4.0–10.5)
nRBC: 0 % (ref 0.0–0.2)

## 2022-03-20 LAB — GLUCOSE, CAPILLARY
Glucose-Capillary: 121 mg/dL — ABNORMAL HIGH (ref 70–99)
Glucose-Capillary: 80 mg/dL (ref 70–99)
Glucose-Capillary: 89 mg/dL (ref 70–99)
Glucose-Capillary: 181 mg/dL — ABNORMAL HIGH (ref 70–99)

## 2022-03-20 LAB — URINE CULTURE: Culture: NO GROWTH

## 2022-03-20 MED ORDER — SODIUM CHLORIDE 0.9 % IV SOLN
1.0000 g | Freq: Once | INTRAVENOUS | Status: AC
  Administered 2022-03-20: 1 g via INTRAVENOUS
  Filled 2022-03-20: qty 10

## 2022-03-20 MED ORDER — SODIUM CHLORIDE 0.9 % IV SOLN
2.0000 g | INTRAVENOUS | Status: DC
  Administered 2022-03-21: 2 g via INTRAVENOUS
  Filled 2022-03-20: qty 20

## 2022-03-20 NOTE — Progress Notes (Signed)
Mobility Specialist - Progress Note   03/20/22 1528  Mobility  HOB Elevated/Bed Position Self regulated  Activity Ambulated with assistance in hallway;Ambulated with assistance to bathroom  Range of Motion/Exercises Active  Level of Assistance Standby assist, set-up cues, supervision of patient - no hands on  Assistive Device Front wheel walker  Distance Ambulated (ft) 1100 ft  Activity Response Tolerated well  Transport method Ambulatory  $Mobility charge 1 Mobility   Pt received in bed and agreeable to mobility. No complaints during ambulation. Assisted pt to bathroom for BM. Pt to bed after session with all needs met.    [facility]

## 2022-03-20 NOTE — Progress Notes (Signed)
PROGRESS NOTE    [name] [name]  TFT:[id number] DOB: [birth year] DOA: 03/17/2022 PCP: [name], MD    Chief Complaint  Patient presents with   Altered Mental Status    Brief Narrative:  61 year old male with PMH of recurrent squamous cell carcinoma of the skin, s/p several Mohs surgeries and curative radiotherapy, type II DM, HTN, HLD, GERD, liver cirrhosis due to NASH, Crohn's disease, pathological left hip fracture, OSA on CPAP, staphylococcal bacteremia with sepsis, presented to the ED 03/17/2022 with complaints of altered mental status, urinary frequency, dysuria, right flank pain and frontal headache.  Admitted for sepsis, POA due to acute cystitis complicating right UPJ obstructing stone.  Urology consulted s/p cystoscopy 9/25.  Improving.   Assessment & Plan:   Principal Problem:   Sepsis secondary to UTI [facility]) Active Problems:   DM2 (diabetes mellitus, type 2) ([place])   CROHN'S DISEASE, LARGE AND SMALL INTESTINES   Depression   GERD   Complex partial seizure ([place])   Hyperbilirubinemia   Hydronephrosis of right kidney   Nephrolith  #1 sepsis secondary to UTI POA, complicating right UPJ stone -Patient on presentation met criteria for sepsis with fever 101.8, tachycardia, leukocytosis. -SARS coronavirus 2 PCR, flu panel negative. -Blood cultures with no growth to date x3 days. -Urine cultures with no growth to date. -Fever curve trending down. -Looks cytosis trended down. -Initially was on VAC which was discontinued since staphylococcal bacteremia noted to be remote. -Continue IV Rocephin through today and likely transition to oral antibiotics tomorrow if continued improvement.  2.  Hypomagnesemia/hypokalemia -Repleted, potassium at 3.8, magnesium noted at 1.7. -Repeat labs in AM.  3.  Hydronephrosis right kidney/nephrolithiasis of right UPJ -Patient seen in consultation by urology, concern for small distal ureteral stone with minimal hydronephrosis. -Patient's is  for cystoscopy 926 and patient noted to have passed right ureteral calculus. -Continue IV antibiotics, tamsulosin. -We will likely need outpatient follow-up with urology.  4.  Diabetes mellitus type 2 -Hemoglobin A1c 4.6 (12/17/2021) -CBG 121. -Outpatient follow-up with PCP.  5.  Multiple rib fractures -Incentive spirometry. -Pain management, supportive care.  6.  History of Crohn's disease -Continue mesalamine 2.5 mg twice daily. -Outpatient follow-up with primary gastroenterologist at [facility].  7.  Depression -Continue home regimen sertraline, trazodone.  8.  GERD PPI.  9.  Complex partial seizures Continue Lamictal 150 mg twice daily.  10.  Hyperbilirubinemia/hyperammonemia -Patient noted to have requested not to take lactulose due to GI side effects. -Continue Xifaxan.  11.  Anemia -H&H stable.  12.  Thrombocytopenia -Likely secondary to acute infection. -Counts fluctuating and improving.  Close to baseline.  13.  Acute metabolic encephalopathy -Likely multifactorial secondary to infectious etiology, pain, hepatic encephalopathy. -CT head and neck without any acute findings. -Patient alert and oriented, coherent, seems to have improved/resolved.  14.  Recurrent squamous cell cancer of the skin -Outpatient follow-up with dermatology.  15.  Additional CT abdominal findings 9/24 -Cirrhosis of the liver with portal hypertension and splenomegaly.  Liver mass 10.4 x 4.5 x 6.6 cm at the right hepatic dome, concerning for HCC, will need contrast-enhanced hepatic protocol MRI likely outpatient. -Nondisplaced multiple rib fractures on the right and left, no pneumothorax. -Placed on incentive spirometry, pain management. -Outpatient follow-up with gastroenterology at [facility].  16.  Severe malnutrition -Secondary to chronic illness cells SCC of the right cheek, Crohn's disease. -Patient noted weight loss, severe fat depletion, severe muscle depletion. -Continue  nutritional supplementation.      DVT prophylaxis:  SCDs Code Status: Full Family Communication: Updated patient and wife at bedside. Disposition: Likely home once transition to oral antibiotics with continued clinical improvement.  Status is: Inpatient Remains inpatient appropriate because: Severity of illness, requiring IV antibiotics   Consultants:  Urology: [name] 03/17/2022  Procedures: Cystoscopy, right retrograde pyelogram with intraoperative interpretation, right ureteroscopy per [name] 03/18/2022 CT C-spine 03/17/2022 CT chest abdomen and pelvis 03/17/2022 CT head 03/17/2022 Chest x-ray 03/17/2022   Antimicrobials:  IV Rocephin 03/17/2022>>>>   Subjective: Laying in bed.  Complaining of right rib pain states he has had rib fractures.  States chronic abdominal pain unchanged.  Denies any dysuria.  Denies any shortness of breath.  No chest pain.  Wife at bedside.  Objective: Vitals:   03/19/22 0602 03/19/22 1408 03/19/22 2134 03/20/22 0615  BP: 139/71 (!) 111/55 123/70 102/70  Pulse: 67 76 71 72  Resp: 18 18 17 17   Temp: 98.3 F (36.8 C) 98.2 F (36.8 C) 98.7 F (37.1 C) 99.2 F (37.3 C)  TempSrc: Oral Oral Oral Oral  SpO2: 98% 98% 97% 95%  Weight:      Height:        Intake/Output Summary (Last 24 hours) at 03/20/2022 1232 Last data filed at 03/20/2022 0946 Gross per 24 hour  Intake 870.17 ml  Output 1600 ml  Net -729.83 ml   Filed Weights   03/17/22 1232 03/18/22 0530 03/18/22 1201  Weight: 70.3 kg 71.1 kg 65.8 kg    Examination:  General exam: Appears calm and comfortable  Respiratory system: Clear to auscultation. Respiratory effort normal. Cardiovascular system: S1 & S2 heard, RRR. No JVD, murmurs, rubs, gallops or clicks. No pedal edema. Gastrointestinal system: Abdomen is nondistended, soft and some diffuse tenderness to palpation which per patient is chronic, positive bowel sounds.  No rebound.  No guarding.  Central nervous system:  Alert and oriented. No focal neurological deficits. Extremities: Symmetric 5 x 5 power. Skin: No rashes, lesions or ulcers Psychiatry: Judgement and insight appear normal. Mood & affect appropriate.     Data Reviewed: I have personally reviewed following labs and imaging studies  CBC: Recent Labs  Lab 03/17/22 0904 03/18/22 0525 03/19/22 0616 03/20/22 0601  WBC 12.7* 4.9 4.0 3.2*  NEUTROABS 11.6*  --   --   --   HGB 11.4* 9.3* 9.9* 9.7*  HCT 34.2* 29.7* 30.0* 29.4*  MCV 91.7 94.6 90.9 91.9  PLT 109* 74* 85* 100*    Basic Metabolic Panel: Recent Labs  Lab 03/17/22 0833 03/18/22 0525 03/19/22 0616 03/20/22 0601  NA 139 140 140 143  K 3.9 3.6 3.3* 3.8  CL 108 111 109 111  CO2 23 23 24 26   GLUCOSE 103* 82 87 79  BUN 10 11 11 9   CREATININE 0.75 0.61 0.59* 0.56*  CALCIUM 8.9 8.2* 8.4* 8.4*  MG  --   --  1.7  --     GFR: Estimated Creatinine Clearance: 91.4 mL/min (A) (by C-G formula based on SCr of 0.56 mg/dL (L)).  Liver Function Tests: Recent Labs  Lab 03/17/22 0833 03/18/22 0525 03/19/22 0616 03/20/22 0601  AST 44* 47* 39 38  ALT 27 26 26 27   ALKPHOS 328* 270* 287* 288*  BILITOT 1.4* 1.2 1.3* 1.1  PROT 6.4* 5.0* 5.4* 5.2*  ALBUMIN 3.4* 2.5* 2.8* 2.7*    CBG: Recent Labs  Lab 03/19/22 1125 03/19/22 1644 03/19/22 2137 03/20/22 0747 03/20/22 1150  GLUCAP 88 117* 84 121* 80     Recent  Results (from the past 240 hour(s))  Resp Panel by RT-PCR (Flu A&B, Covid) Anterior Nasal Swab     Status: None   Collection Time: 03/17/22  8:19 AM   Specimen: Anterior Nasal Swab  Result Value Ref Range Status   SARS Coronavirus 2 by RT PCR NEGATIVE NEGATIVE Final    Comment: (NOTE) SARS-CoV-2 target nucleic acids are NOT DETECTED.  The SARS-CoV-2 RNA is generally detectable in upper respiratory specimens during the acute phase of infection. The lowest concentration of SARS-CoV-2 viral copies this assay can detect is 138 copies/mL. A negative result does not  preclude SARS-Cov-2 infection and should not be used as the sole basis for treatment or other patient management decisions. A negative result may occur with  improper specimen collection/handling, submission of specimen other than nasopharyngeal swab, presence of viral mutation(s) within the areas targeted by this assay, and inadequate number of viral copies(<138 copies/mL). A negative result must be combined with clinical observations, patient history, and epidemiological information. The expected result is Negative.  Fact Sheet for Patients:  EntrepreneurPulse.com.au  Fact Sheet for Healthcare Providers:  IncredibleEmployment.be  This test is no t yet approved or cleared by the [place] FDA and  has been authorized for detection and/or diagnosis of SARS-CoV-2 by FDA under an Emergency Use Authorization (EUA). This EUA will remain  in effect (meaning this test can be used) for the duration of the COVID-19 declaration under Section 564(b)(1) of the Act, 21 U.S.C.section 360bbb-3(b)(1), unless the authorization is terminated  or revoked sooner.       Influenza A by PCR NEGATIVE NEGATIVE Final   Influenza B by PCR NEGATIVE NEGATIVE Final    Comment: (NOTE) The Xpert Xpress SARS-CoV-2/FLU/RSV plus assay is intended as an aid in the diagnosis of influenza from Nasopharyngeal swab specimens and should not be used as a sole basis for treatment. Nasal washings and aspirates are unacceptable for Xpert Xpress SARS-CoV-2/FLU/RSV testing.  Fact Sheet for Patients: EntrepreneurPulse.com.au  Fact Sheet for Healthcare Providers: IncredibleEmployment.be  This test is not yet approved or cleared by the [place] FDA and has been authorized for detection and/or diagnosis of SARS-CoV-2 by FDA under an Emergency Use Authorization (EUA). This EUA will remain in effect (meaning this test can be used) for the  duration of the COVID-19 declaration under Section 564(b)(1) of the Act, 21 U.S.C. section 360bbb-3(b)(1), unless the authorization is terminated or revoked.  Performed at [facility], [place] [address], [place], [place] [address]   Blood culture (routine x 2)     Status: None (Preliminary result)   Collection Time: 03/17/22  8:33 AM   Specimen: BLOOD  Result Value Ref Range Status   Specimen Description   Final    BLOOD BLOOD RIGHT HAND Performed at [place] [address], [place], [place] [address]    Special Requests   Final    BOTTLES DRAWN AEROBIC AND ANAEROBIC Blood Culture results may not be optimal due to an inadequate volume of blood received in culture bottles Performed at [place] [address] [place], [place] [address]    Culture   Final    NO GROWTH 3 DAYS Performed at [facility], [place] [address], [place], [place] [address]    Report Status PENDING  Incomplete  Blood culture (routine x 2)     Status: None (Preliminary result)   Collection Time: 03/17/22  1:21 PM   Specimen: BLOOD  Result Value Ref  Range Status   Specimen Description   Final    BLOOD LEFT ANTECUBITAL Performed at [place] [address], [place], [place] [address]    Special Requests   Final    BOTTLES DRAWN AEROBIC AND ANAEROBIC Blood Culture results may not be optimal due to an inadequate volume of blood received in culture bottles Performed at [place] [address], [place], [place] [address]    Culture   Final    NO GROWTH 3 DAYS Performed at [facility], [place] [address], [place], [place] [address]    Report Status PENDING  Incomplete  Urine Culture     Status: None   Collection Time: 03/19/22  6:30 AM   Specimen: Urine, Clean Catch  Result Value Ref Range Status   Specimen Description   Final    URINE, CLEAN CATCH Performed at [facility], [place] [address], [place], [place] [address]    Special Requests   Final    NONE Performed at [facility], [place] [address] [place], [place] [address]    Culture   Final    NO GROWTH Performed at [facility], [place] [address], [place], [place] [address]    Report Status 03/20/2022 FINAL  Final         Radiology Studies: DG C-Arm 1-60 Min-No Report  Result Date: 03/18/2022 Fluoroscopy was utilized by the requesting physician.  No radiographic interpretation.        Scheduled Meds:  feeding supplement  237 mL Oral BID BM   lactase  3,000 Units Oral QHS   lamoTRIgine  150 mg Oral BID   latanoprost  1 drop Both Eyes QHS   mesalamine  2.4 g Oral Daily   multivitamin with minerals  1 tablet Oral Daily   pantoprazole  40 mg Oral BID   rifaximin  550 mg Oral BID   sertraline  200 mg Oral Q breakfast   tamsulosin  0.4 mg Oral QHS   traZODone  150 mg Oral QHS   Continuous Infusions:  cefTRIAXone (ROCEPHIN)  IV     [START ON 03/21/2022] cefTRIAXone (ROCEPHIN)  IV       LOS: 3 days    Time spent: 35 minutes    Irine Seal, MD [facility]   To contact the attending provider between 7A-7P or the covering provider during after hours 7P-7A, please log into the web site [email or web address] and access using universal [place] password for that web site. If you do not have the password, please call the hospital operator.  03/20/2022, 12:32 PM

## 2022-03-21 DIAGNOSIS — A419 Sepsis, unspecified organism: Secondary | ICD-10-CM | POA: Diagnosis not present

## 2022-03-21 DIAGNOSIS — G40209 Localization-related (focal) (partial) symptomatic epilepsy and epileptic syndromes with complex partial seizures, not intractable, without status epilepticus: Secondary | ICD-10-CM | POA: Diagnosis not present

## 2022-03-21 DIAGNOSIS — K7460 Unspecified cirrhosis of liver: Secondary | ICD-10-CM | POA: Diagnosis not present

## 2022-03-21 DIAGNOSIS — S2241XA Multiple fractures of ribs, right side, initial encounter for closed fracture: Secondary | ICD-10-CM | POA: Diagnosis not present

## 2022-03-21 LAB — CBC WITH DIFFERENTIAL/PLATELET
Abs Immature Granulocytes: 0.03 10*3/uL (ref 0.00–0.07)
Basophils Absolute: 0 10*3/uL (ref 0.0–0.1)
Basophils Relative: 1 %
Eosinophils Absolute: 0.1 10*3/uL (ref 0.0–0.5)
Eosinophils Relative: 2 %
HCT: 30.6 % — ABNORMAL LOW (ref 39.0–52.0)
Hemoglobin: 10 g/dL — ABNORMAL LOW (ref 13.0–17.0)
Immature Granulocytes: 1 %
Lymphocytes Relative: 14 %
Lymphs Abs: 0.5 10*3/uL — ABNORMAL LOW (ref 0.7–4.0)
MCH: 30 pg (ref 26.0–34.0)
MCHC: 32.7 g/dL (ref 30.0–36.0)
MCV: 91.9 fL (ref 80.0–100.0)
Monocytes Absolute: 0.5 10*3/uL (ref 0.1–1.0)
Monocytes Relative: 14 %
Neutro Abs: 2.3 10*3/uL (ref 1.7–7.7)
Neutrophils Relative %: 68 %
Platelets: 102 10*3/uL — ABNORMAL LOW (ref 150–400)
RBC: 3.33 MIL/uL — ABNORMAL LOW (ref 4.22–5.81)
RDW: 14.7 % (ref 11.5–15.5)
WBC: 3.4 10*3/uL — ABNORMAL LOW (ref 4.0–10.5)
nRBC: 0 % (ref 0.0–0.2)

## 2022-03-21 LAB — COMPREHENSIVE METABOLIC PANEL
ALT: 26 U/L (ref 0–44)
AST: 35 U/L (ref 15–41)
Albumin: 3 g/dL — ABNORMAL LOW (ref 3.5–5.0)
Alkaline Phosphatase: 307 U/L — ABNORMAL HIGH (ref 38–126)
Anion gap: 5 (ref 5–15)
BUN: 7 mg/dL (ref 6–20)
CO2: 24 mmol/L (ref 22–32)
Calcium: 8.7 mg/dL — ABNORMAL LOW (ref 8.9–10.3)
Chloride: 113 mmol/L — ABNORMAL HIGH (ref 98–111)
Creatinine, Ser: 0.64 mg/dL (ref 0.61–1.24)
GFR, Estimated: >60 mL/min (ref >60)
Glucose, Bld: 81 mg/dL (ref 70–99)
Potassium: 3.4 mmol/L — ABNORMAL LOW (ref 3.5–5.1)
Sodium: 142 mmol/L (ref 135–145)
Total Bilirubin: 1.1 mg/dL (ref 0.3–1.2)
Total Protein: 5.6 g/dL — ABNORMAL LOW (ref 6.5–8.1)

## 2022-03-21 LAB — GLUCOSE, CAPILLARY
Glucose-Capillary: 80 mg/dL (ref 70–99)
Glucose-Capillary: 105 mg/dL — ABNORMAL HIGH (ref 70–99)

## 2022-03-21 LAB — MAGNESIUM: Magnesium: 1.6 mg/dL — ABNORMAL LOW (ref 1.7–2.4)

## 2022-03-21 MED ORDER — MAGNESIUM SULFATE 4 GM/100ML IV SOLN
4.0000 g | Freq: Once | INTRAVENOUS | Status: AC
  Administered 2022-03-21: 4 g via INTRAVENOUS
  Filled 2022-03-21: qty 100

## 2022-03-21 MED ORDER — POTASSIUM CHLORIDE CRYS ER 20 MEQ PO TBCR
40.0000 meq | EXTENDED_RELEASE_TABLET | Freq: Once | ORAL | Status: AC
  Administered 2022-03-21: 40 meq via ORAL
  Filled 2022-03-21: qty 2

## 2022-03-21 MED ORDER — CEFDINIR 300 MG PO CAPS: 300.0000 mg | ORAL_CAPSULE | Freq: Two times a day (BID) | ORAL | Status: DC

## 2022-03-21 MED ORDER — CEFDINIR 300 MG PO CAPS: 300.0000 mg | ORAL_CAPSULE | Freq: Two times a day (BID) | ORAL | 0 refills | Status: AC

## 2022-03-21 MED ORDER — OXYCODONE HCL 5 MG PO TABS: 5.0000 mg | ORAL_TABLET | Freq: Every day | ORAL | 0 refills | Status: DC | PRN

## 2022-03-21 NOTE — TOC Transition Note (Signed)
Transition of Care [facility]) - CM/SW Discharge Note   Patient Details  Name: [name] [name] MRN: [id number] Date of Birth: September 12, 1960  Transition of [facility]) CM/SW Contact:  Leeroy Cha, RN Phone Number: 03/21/2022, 2:38 PM   Clinical Narrative:    [id number]/patient discharged to return home.  Chart reviewed for TOC needs.  None found.  Patient self care.   Final next level of care: Home/Self Care Barriers to Discharge: Barriers Resolved   Patient Goals and CMS Choice Patient states their goals for this hospitalization and ongoing recovery are:: to go home CMS Medicare.gov Compare Post Acute Care list provided to:: Patient Choice offered to / list presented to : Patient  Discharge Placement                       Discharge Plan and Services   Discharge Planning Services: CM Consult                                 Social Determinants of Health (SDOH) Interventions Housing Interventions: Intervention Not Indicated   Readmission Risk Interventions   No data to display

## 2022-03-21 NOTE — Discharge Summary (Signed)
Physician Discharge Summary  [name] [name] STM:[id number] DOB: 06-02-1961 DOA: 03/17/2022  PCP: [name] Pepper, MD  Admit date: 03/17/2022 Discharge date: 03/21/2022  Time spent: 60 minutes  Recommendations for Outpatient Follow-up:  Follow-up with [name], urology in 2 weeks. Follow-up with primary gastroenterologist at [name] as previously scheduled.  On follow-up hepatic lesion will need to be assessed on follow-up that was noted on abdominal CT and patient will likely need a contrast-enhanced hepatic protocol MRI for further evaluation. Follow-up with [name] Pepper, MD in 2 weeks.  On follow-up patient need a basic metabolic profile, magnesium level checked to follow-up on electrolytes and renal function.  Multiple rib fractures will need to be followed up upon.   Discharge Diagnoses:  Principal Problem:   Sepsis secondary to UTI [facility]) Active Problems:   DM2 (diabetes mellitus, type 2) ([place])   CROHN'S DISEASE, LARGE AND SMALL INTESTINES   Depression   GERD   Complex partial seizure ([place])   Sepsis ([place])   Hyperbilirubinemia   Hydronephrosis of right kidney   Nephrolith   Cirrhosis of liver without ascites (HCC)   Urinary tract infection without hematuria   Discharge Condition: Stable and improved.  Diet recommendation: Regular  Filed Weights   03/17/22 1232 03/18/22 0530 03/18/22 1201  Weight: 70.3 kg 71.1 kg 65.8 kg    History of present illness:  HPI per Dr. Stanford [name] is a 61 y.o. male with medical history significant of anxiety, depression, osteoarthritis, cataracts, complex partial seizures, chron's disease, type 2 diabetes, esophageal stricture, GERD, external hemorrhoids, glaucoma, nephrolithiasis, hyperlipidemia, hypertension, bilateral inguinal hernia, liver cirrhosis due to NASH, migraine headaches, osteoporosis, pathological fracture of the left hip, PTSD, left renal cyst, multiple skin squamous cell skin cancer, sleep apnea on CPAP, history of staph  bacteremia with sepsis who presented to the emergency department with his wife due to altered mental status.  He has been having frequency, dysuria and right flank pain.  He has also had a frontal headache for the past 3 days.  No fever, chills or night sweats. No sore throat, rhinorrhea, dyspnea, wheezing or hemoptysis.  No chest pain, palpitations, diaphoresis, PND, orthopnea or pitting edema of the lower extremities.  No constipation, melena or hematochezia.  No flank pain, dysuria, frequency or hematuria.  No polyuria, polydipsia, polyphagia or blurred vision.   ED course: Initial vital signs were temperature 1 1.8 F, pulse 73, respiration 18, BP 152/84 mmHg O2 sat 99% on room air.  The patient received ceftriaxone 1 g IVPB, tamsulosin 0.4 mg capsule x1, vancomycin and 3100 mL of normal saline bolus.   Lab work: His lactic acid has been 2.4, 2.9 and 3.2 mmol/L.  Urinalysis was positive for nitrates small leukocyte esterase with rare bacteria microscopic examination.  Venous blood gas showed a pH of 7.47, PCO2 of 32 and PO2 of 58 mmHg.  Normal right coronary and acid-based excess.  CBC is her white count 12.7, hemoglobin 11.4 g/dL platelets 109.  Ammonia was 71 mol/L.  CMP with normal electrolytes and renal function.  Glucose was 103 mg deciliter.  Total protein 6.4, albumin 3.4 g/dL.  AST 44, ALT 27 and alkaline phosphatase 328 units/L.  Total bilirubin was 1.4 mg/dL.   Imaging: Portable 1 view chest radiograph with no active disease.  CT head with no acute intracranial normalities.  CT cervical spine showed 3 mm anterolisthesis of 6 6 versus 6 7 which is nonacute.  There was no fracture or acute traumatic and on alignment.  CT chest abdomen and pelvis with contrast showed cirrhotic liver morphology.  There was interval enlargement of the poorly defined hypoattenuating mass at the right hepatic dome.  However, the patient has had a hepatic embolization and radiation recently.  Mild right-sided  hydronephrosis with a questionable punctate urolith at the right UPJ.  Multiple nonobstructing left renal stone including a 1.1 cm nephrolith.  Nondisplaced fractures of the lateral right 6, 7 and 8 ribs which appeared to be acute.  Please see images and full radiology report for further details.  Hospital Course:  #1 sepsis secondary to complicated UTI POA, complicating right UPJ stone -Patient on presentation met criteria for sepsis with fever 101.8, tachycardia, leukocytosis. -SARS coronavirus 2 PCR, flu panel negative. -Blood cultures with no growth to date x4 days. -Urine cultures with no growth to date. -Fever curve trended down. -Leukocytosis trended down. -Initially was on vancomycin which was discontinued since staphylococcal bacteremia noted to be remote. -Patient maintained on IV Rocephin throughout the hospitalization, improved clinically and will be discharged on oral Omnicef for 5 more days to complete a 10-day course of antibiotic treatment.  -Outpatient follow-up with urology and PCP.    2.  Hypomagnesemia/hypokalemia -Repleted, during the hospitalization.   -Placed back on home regimen of oral daily potassium supplementation on discharge and will follow-up with PCP in the outpatient setting.    3.  Hydronephrosis right kidney/nephrolithiasis of right UPJ -Patient seen in consultation by urology, concern for small distal ureteral stone with minimal hydronephrosis. -Patient's s/p cystoscopy 9/26 and patient noted to have passed right ureteral calculus. -Patient maintained on IV antibiotics and tamsulosin during the hospitalization and will be transition to oral antibiotics on discharge for 5 more days to complete a 10-day course of treatment due to concern for complicated UTI.   -Outpatient follow-up with [name] urology in 2 weeks.    4.  Diabetes mellitus type 2 -Hemoglobin A1c 4.6 (12/17/2021) -CBG 121. -Outpatient follow-up with PCP.   5.  Multiple rib  fractures -Incentive spirometry. -Pain management, supportive care.   6.  History of Crohn's disease -Patient maintained on home regimen mesalamine 2.5 mg twice daily. -Outpatient follow-up with primary gastroenterologist at [facility].   7.  Depression -Patient maintained on home regimen sertraline, trazodone.   8.  GERD Patient maintained on home regimen of PPI.   9.  Complex partial seizures Patient maintained on home regimen of Lamictal 150 mg twice daily.   10.  Hyperbilirubinemia/hyperammonemia -Patient noted to have requested not to take lactulose due to GI side effects. -Patient maintained on home regimen of Xifaxan.   11.  Anemia -H&H stable.   12.  Thrombocytopenia -Likely secondary to acute infection. -Counts fluctuating and improved.   -Close to baseline.   13.  Acute metabolic encephalopathy -Likely multifactorial secondary to infectious etiology, pain, hepatic encephalopathy. -CT head and neck without any acute findings. -Patient improved clinically with treatment of UTI and by day of discharge was back to baseline, alert oriented and coherent.   -Outpatient follow-up.     14.  Recurrent squamous cell cancer of the skin -Outpatient follow-up with dermatology.   15.  Additional CT abdominal findings 9/24 -Cirrhosis of the liver with portal hypertension and splenomegaly.  Liver mass 10.4 x 4.5 x 6.6 cm at the right hepatic dome, concerning for HCC, will need contrast-enhanced hepatic protocol MRI likely outpatient. -Nondisplaced multiple rib fractures on the right and left, no pneumothorax. -Placed on incentive spirometry, pain management. -Outpatient follow-up with  gastroenterology at [facility].   16.  Severe malnutrition -Secondary to chronic illness cells SCC of the right cheek, Crohn's disease. -Patient noted weight loss, severe fat depletion, severe muscle depletion. -Continue nutritional supplementation.    Procedures: Cystoscopy, right retrograde  pyelogram with intraoperative interpretation, right ureteroscopy per [name] 03/18/2022 CT C-spine 03/17/2022 CT chest abdomen and pelvis 03/17/2022 CT head 03/17/2022 Chest x-ray 03/17/2022    Consultations: Urology: [name] 03/17/2022    Discharge Exam: Vitals:   03/20/22 2045 03/21/22 0556  BP: 116/63 130/79  Pulse: 79 72  Resp: 17 16  Temp: 99.5 F (37.5 C) 99 F (37.2 C)  SpO2: 97% 96%    General: NAD Cardiovascular: Regular rate rhythm no murmurs rubs or gallops.  No JVD.  No lower extremity edema. Respiratory: Clear to auscultation bilaterally.  No wheezes, no crackles, no rhonchi.  Fair air movement.  Speaking in full sentences.  Discharge Instructions   Discharge Instructions     Ambulatory referral to Physical Therapy   Complete by: As directed    Diet general   Complete by: As directed    Increase activity slowly   Complete by: As directed    No wound care   Complete by: As directed       Allergies as of 03/21/2022       Reactions   Sulfonamide Derivatives Hives, Rash   Z-pak [azithromycin] Swelling   Dilaudid [hydromorphone Hcl] Nausea And Vomiting   Sulfa Antibiotics Hives, Rash        Medication List     STOP taking these medications    diclofenac Sodium 1 % Gel Commonly known as: VOLTAREN   docusate sodium 100 MG capsule Commonly known as: COLACE   Eliquis 2.5 MG Tabs tablet Generic drug: apixaban   lidocaine 4 %   lidocaine 5 % Commonly known as: LIDODERM   methocarbamol 750 MG tablet Commonly known as: ROBAXIN       TAKE these medications    acetaminophen 500 MG tablet Commonly known as: TYLENOL Take 2 tablets (1,000 mg total) by mouth 3 (three) times daily. What changed: when to take this   acitretin 25 MG capsule Commonly known as: [name] Take 25 mg by mouth daily.   CALCIUM PO Take 1 tablet by mouth 2 (two) times daily.   cefdinir 300 MG capsule Commonly known as: OMNICEF Take 1 capsule (300 mg  total) by mouth every 12 (twelve) hours for 5 days. Start taking on: March 22, 2022   chlorhexidine 0.12 % solution Commonly known as: PERIDEX Use as directed 15 mLs in the mouth or throat 2 (two) times daily.   cycloSPORINE 0.05 % ophthalmic emulsion Commonly known as: RESTASIS Place 1 drop into both eyes 2 (two) times daily as needed (dry eyes).   DAIRY RELIEF PO Take 1 tablet by mouth in the morning and at bedtime.   desonide 0.05 % ointment Commonly known as: DESOWEN Apply 1 Application topically See admin instructions. Apply to affected area of lower lip 3 times a day as needed for irritation- after mixing with Mupirocin 2% ointment   esomeprazole 40 MG capsule Commonly known as: NEXIUM Take 1 capsule (40 mg total) by mouth 2 (two) times daily before a meal.   Eszopiclone 3 MG Tabs Take 3 mg by mouth at bedtime.   feeding supplement Liqd Take 237 mLs by mouth 2 (two) times daily between meals.   folic acid 1 MG tablet Commonly known as: FOLVITE Take 2 tablets (2  mg total) by mouth daily.   gabapentin 300 MG capsule Commonly known as: NEURONTIN Take 300 mg by mouth in the morning and at bedtime.   hydrocortisone 2.5 % cream Apply 1 Application topically 2 (two) times daily as needed (Itchy skin).   lamoTRIgine 150 MG tablet Commonly known as: LaMICtal Take 1 tablet (150 mg total) by mouth 2 (two) times daily.   latanoprost 0.005 % ophthalmic solution Commonly known as: XALATAN Place 1 drop into both eyes at bedtime.   Libtayo 350 MG/7ML Soln injection Generic drug: cemiplimab-rwlc Inject into the vein See admin instructions. Every 4-6 weeks   mesalamine 1.2 g EC tablet Commonly known as: LIALDA Take 2 tablets (2.4 g total) by mouth 2 (two) times daily. What changed: when to take this   methscopolamine 5 MG tablet Commonly known as: PAMINE FORTE Take 1 tablet (5 mg total) by mouth 2 (two) times daily.   mupirocin ointment 2 % Commonly known as:  BACTROBAN Apply 1 Application topically See admin instructions. Apply to affected area of lower lip three times a day as needed for irritation- after mixing with Desonide 0.05% ointment   Myrbetriq 50 MG Tb24 tablet Generic drug: mirabegron ER Take 50 mg by mouth at bedtime.   oxyCODONE 5 MG immediate release tablet Commonly known as: Oxy IR/ROXICODONE Take 1 tablet (5 mg total) by mouth daily as needed for moderate pain or severe pain. What changed: Another medication with the same name was removed. Continue taking this medication, and follow the directions you see here.   potassium chloride SA 20 MEQ tablet Commonly known as: KLOR-CON M Take 1 tablet (20 mEq total) by mouth 2 (two) times daily.   PROBIOTIC PO Take 1 tablet at bedtime by mouth.   rifaximin 550 MG Tabs tablet Commonly known as: Xifaxan Take 1 tablet (550 mg total) by mouth 2 (two) times daily.   sertraline 100 MG tablet Commonly known as: ZOLOFT Take 200 mg daily with breakfast by mouth.   tamsulosin 0.4 MG Caps capsule Commonly known as: FLOMAX Take 0.4 mg by mouth at bedtime.   tolterodine 4 MG 24 hr capsule Commonly known as: DETROL LA Take 4 mg by mouth every evening.   traZODone 150 MG tablet Commonly known as: DESYREL Take 150 mg by mouth at bedtime.   VITAMIN D-3 PO Take 1 capsule by mouth daily.       Allergies  Allergen Reactions   Sulfonamide Derivatives Hives and Rash   Z-Pak [Azithromycin] Swelling   Dilaudid [Hydromorphone Hcl] Nausea And Vomiting   Sulfa Antibiotics Hives and Rash    Follow-up Information     [name] Pepper, MD. Schedule an appointment as soon as possible for a visit in 2 week(s).   Specialty: Family Medicine Contact information: [address] [place] [address] [phone]         Remi Haggard, MD. Schedule an appointment as soon as possible for a visit in 2 week(s).   Specialty: Urology Contact information: [address] [place] [address] [phone]         Gastroenterologist at [facility] Follow up.   Why: Follow-up as scheduled.  Will need evaluation for liver mass noted on abdominal CT, will likely need a contrast-enhanced hepatic protocol MRI.                 The results of significant diagnostics from this hospitalization (including imaging, microbiology, ancillary and laboratory) are listed below  for reference.    Significant Diagnostic Studies: DG C-Arm 1-60 Min-No Report  Result Date: 03/18/2022 Fluoroscopy was utilized by the requesting physician.  No radiographic interpretation.   CT CHEST ABDOMEN PELVIS W CONTRAST  Result Date: 03/17/2022 CLINICAL DATA:  Bacteremic, history of stones, positive UTI, history of cirrhosis EXAM: CT CHEST, ABDOMEN, AND PELVIS WITH CONTRAST TECHNIQUE: Multidetector CT imaging of the chest, abdomen and pelvis was performed following the standard protocol during bolus administration of intravenous contrast. RADIATION DOSE REDUCTION: This exam was performed according to the departmental dose-optimization program which includes automated exposure control, adjustment of the mA and/or kV according to patient size and/or use of iterative reconstruction technique. CONTRAST:  124m OMNIPAQUE IOHEXOL 300 MG/ML  SOLN COMPARISON:  CT 11/30/2021, 05/06/2017, 10/12/2021 FINDINGS: CT CHEST FINDINGS Cardiovascular: Heart size within normal limits. Thoracic aorta is nonaneurysmal. Minimal atherosclerotic calcification of the aorta and coronary arteries. Central pulmonary vasculature within normal limits. Mediastinum/Nodes: No enlarged mediastinal, hilar, or axillary lymph nodes. Thyroid gland, trachea, and esophagus demonstrate no significant findings. Lungs/Pleura: Lungs are clear. No pleural effusion or pneumothorax. Musculoskeletal: Nondisplaced fractures of the lateral right sixth, seventh, and eighth ribs, which appear acute. There are subacute healing fractures of the  anterolateral left fifth through seventh ribs. Additional old healed fractures of the right scapula and several right ribs. Chronic mild superior endplate compression fractures of T3 and T6. No suspicious lytic or sclerotic bone lesion is seen. CT ABDOMEN PELVIS FINDINGS Hepatobiliary: Significant interval enlargement of poorly defined hypoattenuating mass at the right hepatic dome comment now measuring approximately 10.4 x 4.5 x 6.6 cm (series 4, image 41), previously measured approximately 3.0 cm on 11/30/2021. Biliary dilatation is seen within the right hepatic lobe anteriorly. Cirrhotic liver morphology with nodular contour and caudate lobe hypertrophy. Gallbladder is moderately distended without hyperdense stone or inflammatory changes. Pancreas: Unremarkable. No pancreatic ductal dilatation or surrounding inflammatory changes. Spleen: Splenomegaly. Adrenals/Urinary Tract: Unremarkable adrenal glands. Multiple nonobstructing stones within the left kidney including a 1.1 cm stone within the renal pelvis. Subtle punctate focus of hyperdensity in the region of the right ureterovesical junction could represent a tiny stone (series 4, image 111). There is mild hydronephrosis of the right kidney. Unchanged left renal cysts, which do not require follow-up imaging. Urinary bladder wall is slightly thickened. Stomach/Bowel: Stomach is within normal limits. No evidence of bowel wall thickening, distention, or inflammatory changes. Vascular/Lymphatic: Aortic atherosclerosis. No enlarged abdominal or pelvic lymph nodes. Reproductive: Prostate is unremarkable. Other: No free fluid. No abdominopelvic fluid collection. No pneumoperitoneum. No abdominal wall hernia. Musculoskeletal: Prior left hip ORIF.  No acute bony findings. IMPRESSION: 1. Cirrhotic liver morphology with evidence of portal hypertension including splenomegaly. 2. Significant interval enlargement of poorly defined hypoattenuating mass at the right hepatic  dome now measuring up to 10.4 x 4.5 x 6.6 cm, previously measured approximately 3.0 cm on 11/30/2021. Findings are concerning for hepatocellular carcinoma. Further evaluation with contrast-enhanced hepatic protocol MRI is recommended, preferably on a nonemergent, outpatient basis. 3. Mild right-sided hydronephrosis with a questionable punctate stone at the right ureterovesical junction. 4. Multiple nonobstructing left renal stones including a 1.1 cm stone within the renal pelvis. 5. Nondisplaced fractures of the lateral right sixth, seventh, and eighth ribs, which appear acute. Subacute healing fractures of the anterolateral left fifth through seventh ribs. No pneumothorax. Electronically Signed   By: NDavina PokeD.O.   On: 03/17/2022 11:57   CT Head Wo Contrast  Result Date: 03/17/2022 CLINICAL DATA:  Encephalopathic.  Known cancer. EXAM: CT HEAD WITHOUT CONTRAST CT CERVICAL SPINE WITHOUT CONTRAST TECHNIQUE: Multidetector CT imaging of the head and cervical spine was performed following the standard protocol without intravenous contrast. Multiplanar CT image reconstructions of the cervical spine were also generated. RADIATION DOSE REDUCTION: This exam was performed according to the departmental dose-optimization program which includes automated exposure control, adjustment of the mA and/or kV according to patient size and/or use of iterative reconstruction technique. COMPARISON:  December 17, 2021 FINDINGS: CT HEAD FINDINGS Brain: No subdural, epidural, or subarachnoid hemorrhage. Volume loss, particularly frontally is similar in the interval. No mass effect or midline shift. Ventricles and sulci are stable. Cerebellum, brainstem, and basal cisterns are within normal limits. No mass effect or midline shift. No acute cortical ischemia or infarct. Vascular: No hyperdense vessel or unexpected calcification. Skull: Normal. Negative for fracture or focal lesion. Sinuses/Orbits: No acute finding. Other: None. CT  CERVICAL SPINE FINDINGS Alignment: 3 mm anterolisthesis of C6 versus C7, not significantly changed in the interval. No other malalignment. Skull base and vertebrae: No acute fracture. No primary bone lesion or focal pathologic process. Soft tissues and spinal canal: No prevertebral fluid or swelling. No visible canal hematoma. Disc levels: Multilevel degenerative disc disease. Mild facet degenerative changes. Upper chest: Negative. Other: No other abnormalities. IMPRESSION: 1. No acute intracranial abnormalities. Volume loss with prominence of the sulci. 2. 3 mm anterolisthesis of C6 versus C7 is nonacute. No fracture or acute traumatic malalignment. Degenerative changes. Electronically Signed   By: Dorise Bullion III M.D.   On: 03/17/2022 11:52   CT Cervical Spine Wo Contrast  Result Date: 03/17/2022 CLINICAL DATA:  Encephalopathic.  Known cancer. EXAM: CT HEAD WITHOUT CONTRAST CT CERVICAL SPINE WITHOUT CONTRAST TECHNIQUE: Multidetector CT imaging of the head and cervical spine was performed following the standard protocol without intravenous contrast. Multiplanar CT image reconstructions of the cervical spine were also generated. RADIATION DOSE REDUCTION: This exam was performed according to the departmental dose-optimization program which includes automated exposure control, adjustment of the mA and/or kV according to patient size and/or use of iterative reconstruction technique. COMPARISON:  December 17, 2021 FINDINGS: CT HEAD FINDINGS Brain: No subdural, epidural, or subarachnoid hemorrhage. Volume loss, particularly frontally is similar in the interval. No mass effect or midline shift. Ventricles and sulci are stable. Cerebellum, brainstem, and basal cisterns are within normal limits. No mass effect or midline shift. No acute cortical ischemia or infarct. Vascular: No hyperdense vessel or unexpected calcification. Skull: Normal. Negative for fracture or focal lesion. Sinuses/Orbits: No acute finding. Other:  None. CT CERVICAL SPINE FINDINGS Alignment: 3 mm anterolisthesis of C6 versus C7, not significantly changed in the interval. No other malalignment. Skull base and vertebrae: No acute fracture. No primary bone lesion or focal pathologic process. Soft tissues and spinal canal: No prevertebral fluid or swelling. No visible canal hematoma. Disc levels: Multilevel degenerative disc disease. Mild facet degenerative changes. Upper chest: Negative. Other: No other abnormalities. IMPRESSION: 1. No acute intracranial abnormalities. Volume loss with prominence of the sulci. 2. 3 mm anterolisthesis of C6 versus C7 is nonacute. No fracture or acute traumatic malalignment. Degenerative changes. Electronically Signed   By: Dorise Bullion III M.D.   On: 03/17/2022 11:52   DG Chest Portable 1 View  Result Date: 03/17/2022 CLINICAL DATA:  Acute mental status change.  Fever. EXAM: PORTABLE CHEST 1 VIEW COMPARISON:  December 17, 2021 FINDINGS: The heart, hila, and mediastinum are unchanged. No pneumothorax. No nodules or masses. No focal infiltrate to  explain the patient's fever. IMPRESSION: No active disease. Electronically Signed   By: Dorise Bullion III M.D.   On: 03/17/2022 07:48   OCT, Retina - OU - Both Eyes  Result Date: 03/07/2022 Right Eye Quality was good. Central Foveal Thickness: 300. Progression has been stable. Findings include normal foveal contour, no IRF, no SRF, vitreomacular adhesion (Focal shallow SRF within schisis cavity -- schisis detachment superior periphery, separate focal schisis cavity ST periphery -- both caught on widefield - not imaged today). Left Eye Quality was good. Central Foveal Thickness: 285. Progression has improved. Findings include normal foveal contour, no IRF, no SRF (Persistent cystic changes inferior macula - slightly improved). Notes *Images captured and stored on drive Diagnosis / Impression: OD: Focal shallow SRF within schisis cavity -- schisis detachment superior periphery,  separate focal schisis cavity ST periphery -- previously caught on widefield -- not imaged today OS: persistent cystic changes inferior macula--slightly improved Clinical management: See below Abbreviations: NFP - Normal foveal profile. CME - cystoid macular edema. PED - pigment epithelial detachment. IRF - intraretinal fluid. SRF - subretinal fluid. EZ - ellipsoid zone. ERM - epiretinal membrane. ORA - outer retinal atrophy. ORT - outer retinal tubulation. SRHM - subretinal hyper-reflective material. IRHM - intraretinal hyper-reflective material    Microbiology: Recent Results (from the past 240 hour(s))  Resp Panel by RT-PCR (Flu A&B, Covid) Anterior Nasal Swab     Status: None   Collection Time: 03/17/22  8:19 AM   Specimen: Anterior Nasal Swab  Result Value Ref Range Status   SARS Coronavirus 2 by RT PCR NEGATIVE NEGATIVE Final    Comment: (NOTE) SARS-CoV-2 target nucleic acids are NOT DETECTED.  The SARS-CoV-2 RNA is generally detectable in upper respiratory specimens during the acute phase of infection. The lowest concentration of SARS-CoV-2 viral copies this assay can detect is 138 copies/mL. A negative result does not preclude SARS-Cov-2 infection and should not be used as the sole basis for treatment or other patient management decisions. A negative result may occur with  improper specimen collection/handling, submission of specimen other than nasopharyngeal swab, presence of viral mutation(s) within the areas targeted by this assay, and inadequate number of viral copies(<138 copies/mL). A negative result must be combined with clinical observations, patient history, and epidemiological information. The expected result is Negative.  Fact Sheet for Patients:  EntrepreneurPulse.com.au  Fact Sheet for Healthcare Providers:  IncredibleEmployment.be  This test is no t yet approved or cleared by the [place] FDA and  has been authorized for  detection and/or diagnosis of SARS-CoV-2 by FDA under an Emergency Use Authorization (EUA). This EUA will remain  in effect (meaning this test can be used) for the duration of the COVID-19 declaration under Section 564(b)(1) of the Act, 21 U.S.C.section 360bbb-3(b)(1), unless the authorization is terminated  or revoked sooner.       Influenza A by PCR NEGATIVE NEGATIVE Final   Influenza B by PCR NEGATIVE NEGATIVE Final    Comment: (NOTE) The Xpert Xpress SARS-CoV-2/FLU/RSV plus assay is intended as an aid in the diagnosis of influenza from Nasopharyngeal swab specimens and should not be used as a sole basis for treatment. Nasal washings and aspirates are unacceptable for Xpert Xpress SARS-CoV-2/FLU/RSV testing.  Fact Sheet for Patients: EntrepreneurPulse.com.au  Fact Sheet for Healthcare Providers: IncredibleEmployment.be  This test is not yet approved or cleared by the [place] FDA and has been authorized for detection and/or diagnosis of SARS-CoV-2 by FDA under an Emergency Use Authorization (EUA). This EUA  will remain in effect (meaning this test can be used) for the duration of the COVID-19 declaration under Section 564(b)(1) of the Act, 21 U.S.C. section 360bbb-3(b)(1), unless the authorization is terminated or revoked.  Performed at [facility], [place] [address] [place], [place] [address]   Blood culture (routine x 2)     Status: None (Preliminary result)   Collection Time: 03/17/22  8:33 AM   Specimen: BLOOD  Result Value Ref Range Status   Specimen Description   Final    BLOOD BLOOD RIGHT HAND Performed at [facility] [address] [place], [place] [address]    Special Requests   Final    BOTTLES DRAWN AEROBIC AND ANAEROBIC Blood Culture results may not be optimal due to an inadequate volume of blood received in culture bottles Performed at [facility]  [address], [place], [place] [address]    Culture   Final    NO GROWTH 4 DAYS Performed at [facility], [place] [address] [place], [place] [address]    Report Status PENDING  Incomplete  Blood culture (routine x 2)     Status: None (Preliminary result)   Collection Time: 03/17/22  1:21 PM   Specimen: BLOOD  Result Value Ref Range Status   Specimen Description   Final    BLOOD LEFT ANTECUBITAL Performed at [facility] [address] [place], [place] [address]    Special Requests   Final    BOTTLES DRAWN AEROBIC AND ANAEROBIC Blood Culture results may not be optimal due to an inadequate volume of blood received in culture bottles Performed at [facility] [address] [place], [place] [address]    Culture   Final    NO GROWTH 4 DAYS Performed at [facility], [place] [address], [place], [place] [address]    Report Status PENDING  Incomplete  Urine Culture     Status: None   Collection Time: 03/19/22  6:30 AM   Specimen: Urine, Clean Catch  Result Value Ref Range Status   Specimen Description   Final    URINE, CLEAN CATCH Performed at [facility], [place] [address], [place], [place] [address]    Special Requests   Final    NONE Performed at [facility], [place] [address], [place], [place] [address]    Culture   Final    NO GROWTH Performed at [facility], [place] [address], [place],  [address]    Report Status 03/20/2022 FINAL  Final     Labs: Basic Metabolic Panel: Recent Labs  Lab 03/17/22 0833 03/18/22 0525 03/19/22 0616 03/20/22 0601 03/21/22 0603  NA 139 140 140 143 142  K 3.9 3.6 3.3* 3.8 3.4*  CL 108 111 109 111 113*  CO2 _0 GLUCOSE 103* 82 87 79 81  BUN _1 CREATININE 0.75 0.61 0.59* 0.56* 0.64  CALCIUM 8.9 8.2* 8.4* 8.4* 8.7*  MG  --   --  1.7  --  1.6*   Liver Function Tests: Recent Labs  Lab 03/17/22 0833 03/18/22 0525  03/19/22 0616 03/20/22 0601 03/21/22 0603  AST 44* 47* 39 38 35  ALT _2 ALKPHOS 328* 270* 287* 288* 307*  BILITOT 1.4* 1.2 1.3* 1.1 1.1  PROT 6.4* 5.0* 5.4* 5.2* 5.6*  ALBUMIN 3.4* 2.5* 2.8* 2.7* 3.0*  No results for input(s): "LIPASE", "AMYLASE" in the last 168 hours. Recent Labs  Lab 03/17/22 0833 03/18/22 0744  AMMONIA 71* 66*   CBC: Recent Labs  Lab 03/17/22 0904 03/18/22 0525 03/19/22 0616 03/20/22 0601 03/21/22 0603  WBC 12.7* 4.9 4.0 3.2* 3.4*  NEUTROABS 11.6*  --   --   --  2.3  HGB 11.4* 9.3* 9.9* 9.7* 10.0*  HCT 34.2* 29.7* 30.0* 29.4* 30.6*  MCV 91.7 94.6 90.9 91.9 91.9  PLT 109* 74* 85* 100* 102*   Cardiac Enzymes: No results for input(s): "CKTOTAL", "CKMB", "CKMBINDEX", "TROPONINI" in the last 168 hours. BNP: BNP (last 3 results) No results for input(s): "BNP" in the last 8760 hours.  ProBNP (last 3 results) No results for input(s): "PROBNP" in the last 8760 hours.  CBG: Recent Labs  Lab 03/20/22 1150 03/20/22 1631 03/20/22 2042 03/21/22 0749 03/21/22 1218  GLUCAP 80 89 181* 80 105*       Signed:  Irine Seal MD.  [facility] 03/21/2022, 2:45 PM

## 2022-03-21 NOTE — Progress Notes (Signed)
Mobility Specialist - Progress Note   03/21/22 1111  Mobility  HOB Elevated/Bed Position Self regulated  Activity Ambulated with assistance in hallway;Ambulated with assistance to bathroom  Range of Motion/Exercises Active  Level of Assistance Contact guard assist, steadying assist  Assistive Device Front wheel walker  Distance Ambulated (ft) 1100 ft  Activity Response Tolerated well  Transport method Ambulatory  $Mobility charge 1 Mobility   Pt received in bed and agreeable to mobility. Pt c/o feeling weaker today. Assisted pt to bathroom upon return. Pt to recliner after session with all needs met & nurse in room.   [facility]

## 2022-03-21 NOTE — Consult Note (Signed)
   [facility] CM Inpatient Consult   03/21/2022  [name] [name] March 20, 1961 [id number]   [name] [ACO] Patient: Medicare ACO REACH   Primary Care Provider: [name], MD, [facility] [place],  is an Independent Embedded provider with a Care Management team and program and is listed for the [facility] follow up needs    [facility] Liaison remote coverage for [facility].    Review of patient's medical record reveals patient is to transition home today.  Call to patient's room and spoke with patient, HIPPA verified.  Explained to patient regarding potential Transition of Care outreach for post hospital follow up. Patient was gracious and expressed no additional needs.  Wife, [name] [name], is his contact person, he says.     Plan:  Patient's care coordination is listed with [facility] for [facility] physicians.  For questions contact:    Natividad Brood, RN BSN [facility] Liaison  [phone] business mobile phone Toll free office [phone]  Fax number: [phone] [email or web address]     .

## 2022-03-21 NOTE — Anesthesia Postprocedure Evaluation (Signed)
Anesthesia Post Note  Patient: [name] [name]  Procedure(s) Performed: CYSTOSCOPY WITH RETROGRADE PYELOGRAM, URETEROSCOPY (Right)     Anesthesia Type: General Anesthetic complications: no   No notable events documented.  Last Vitals:  Vitals:   03/20/22 2045 03/21/22 0556  BP: 116/63 130/79  Pulse: 79 72  Resp: 17 16  Temp: 37.5 C 37.2 C  SpO2: 97% 96%    Last Pain:  Vitals:   03/21/22 0925  TempSrc:   PainSc: 0-No pain                 Tiajuana Amass

## 2022-03-21 NOTE — Care Management Important Message (Signed)
Important Message  Patient Details IM Letter given to the Patient. Name: [name] [name] MRN: [id number] Date of Birth: July 01, 1960   Medicare Important Message Given:  Yes     Kerin Salen 03/21/2022, 10:15 AM

## 2022-03-22 ENCOUNTER — Ambulatory Visit: Payer: Medicare Other | Admitting: Physical Therapy

## 2022-03-22 DIAGNOSIS — C44329 Squamous cell carcinoma of skin of other parts of face: Secondary | ICD-10-CM | POA: Diagnosis not present

## 2022-03-22 LAB — CULTURE, BLOOD (ROUTINE X 2)
Culture: NO GROWTH
Culture: NO GROWTH

## 2022-03-25 ENCOUNTER — Encounter: Payer: Self-pay | Admitting: Physical Therapy

## 2022-03-25 ENCOUNTER — Ambulatory Visit: Payer: Medicare Other | Attending: Family Medicine | Admitting: Physical Therapy

## 2022-03-25 ENCOUNTER — Encounter: Payer: Self-pay | Admitting: Family Medicine

## 2022-03-25 ENCOUNTER — Telehealth: Payer: 59 | Admitting: Family Medicine

## 2022-03-25 DIAGNOSIS — K7581 Nonalcoholic steatohepatitis (NASH): Secondary | ICD-10-CM | POA: Diagnosis not present

## 2022-03-25 DIAGNOSIS — K7460 Unspecified cirrhosis of liver: Secondary | ICD-10-CM | POA: Diagnosis not present

## 2022-03-25 DIAGNOSIS — N390 Urinary tract infection, site not specified: Secondary | ICD-10-CM | POA: Diagnosis not present

## 2022-03-25 DIAGNOSIS — M4696 Unspecified inflammatory spondylopathy, lumbar region: Secondary | ICD-10-CM | POA: Diagnosis not present

## 2022-03-25 DIAGNOSIS — E119 Type 2 diabetes mellitus without complications: Secondary | ICD-10-CM | POA: Diagnosis not present

## 2022-03-25 DIAGNOSIS — D61818 Other pancytopenia: Secondary | ICD-10-CM

## 2022-03-25 DIAGNOSIS — Z5112 Encounter for antineoplastic immunotherapy: Secondary | ICD-10-CM | POA: Diagnosis not present

## 2022-03-25 DIAGNOSIS — R1311 Dysphagia, oral phase: Secondary | ICD-10-CM | POA: Diagnosis not present

## 2022-03-25 DIAGNOSIS — Z741 Need for assistance with personal care: Secondary | ICD-10-CM | POA: Diagnosis not present

## 2022-03-25 DIAGNOSIS — F411 Generalized anxiety disorder: Secondary | ICD-10-CM | POA: Diagnosis not present

## 2022-03-25 DIAGNOSIS — N136 Pyonephrosis: Secondary | ICD-10-CM | POA: Diagnosis present

## 2022-03-25 DIAGNOSIS — R000 Tachycardia, unspecified: Secondary | ICD-10-CM | POA: Diagnosis not present

## 2022-03-25 DIAGNOSIS — R531 Weakness: Secondary | ICD-10-CM | POA: Diagnosis not present

## 2022-03-25 DIAGNOSIS — G40209 Localization-related (focal) (partial) symptomatic epilepsy and epileptic syndromes with complex partial seizures, not intractable, without status epilepticus: Secondary | ICD-10-CM | POA: Diagnosis not present

## 2022-03-25 DIAGNOSIS — R2689 Other abnormalities of gait and mobility: Secondary | ICD-10-CM | POA: Diagnosis not present

## 2022-03-25 DIAGNOSIS — Z7401 Bed confinement status: Secondary | ICD-10-CM | POA: Diagnosis not present

## 2022-03-25 DIAGNOSIS — M6281 Muscle weakness (generalized): Secondary | ICD-10-CM | POA: Insufficient documentation

## 2022-03-25 DIAGNOSIS — C4492 Squamous cell carcinoma of skin, unspecified: Secondary | ICD-10-CM | POA: Diagnosis not present

## 2022-03-25 DIAGNOSIS — G4730 Sleep apnea, unspecified: Secondary | ICD-10-CM | POA: Diagnosis not present

## 2022-03-25 DIAGNOSIS — N135 Crossing vessel and stricture of ureter without hydronephrosis: Secondary | ICD-10-CM | POA: Diagnosis not present

## 2022-03-25 DIAGNOSIS — R64 Cachexia: Secondary | ICD-10-CM | POA: Diagnosis present

## 2022-03-25 DIAGNOSIS — R509 Fever, unspecified: Secondary | ICD-10-CM | POA: Diagnosis not present

## 2022-03-25 DIAGNOSIS — K219 Gastro-esophageal reflux disease without esophagitis: Secondary | ICD-10-CM | POA: Diagnosis present

## 2022-03-25 DIAGNOSIS — Z79899 Other long term (current) drug therapy: Secondary | ICD-10-CM | POA: Diagnosis not present

## 2022-03-25 DIAGNOSIS — Z1152 Encounter for screening for COVID-19: Secondary | ICD-10-CM | POA: Diagnosis not present

## 2022-03-25 DIAGNOSIS — K769 Liver disease, unspecified: Secondary | ICD-10-CM | POA: Diagnosis not present

## 2022-03-25 DIAGNOSIS — D500 Iron deficiency anemia secondary to blood loss (chronic): Secondary | ICD-10-CM | POA: Diagnosis present

## 2022-03-25 DIAGNOSIS — E785 Hyperlipidemia, unspecified: Secondary | ICD-10-CM | POA: Diagnosis present

## 2022-03-25 DIAGNOSIS — M6259 Muscle wasting and atrophy, not elsewhere classified, multiple sites: Secondary | ICD-10-CM | POA: Diagnosis not present

## 2022-03-25 DIAGNOSIS — D696 Thrombocytopenia, unspecified: Secondary | ICD-10-CM | POA: Diagnosis present

## 2022-03-25 DIAGNOSIS — N132 Hydronephrosis with renal and ureteral calculous obstruction: Secondary | ICD-10-CM | POA: Diagnosis not present

## 2022-03-25 DIAGNOSIS — R1310 Dysphagia, unspecified: Secondary | ICD-10-CM | POA: Diagnosis not present

## 2022-03-25 DIAGNOSIS — R109 Unspecified abdominal pain: Secondary | ICD-10-CM | POA: Diagnosis not present

## 2022-03-25 DIAGNOSIS — I700 Atherosclerosis of aorta: Secondary | ICD-10-CM | POA: Diagnosis not present

## 2022-03-25 DIAGNOSIS — I10 Essential (primary) hypertension: Secondary | ICD-10-CM | POA: Diagnosis not present

## 2022-03-25 DIAGNOSIS — N1330 Unspecified hydronephrosis: Secondary | ICD-10-CM | POA: Diagnosis not present

## 2022-03-25 DIAGNOSIS — Z85828 Personal history of other malignant neoplasm of skin: Secondary | ICD-10-CM | POA: Diagnosis not present

## 2022-03-25 DIAGNOSIS — C220 Liver cell carcinoma: Secondary | ICD-10-CM | POA: Insufficient documentation

## 2022-03-25 DIAGNOSIS — H409 Unspecified glaucoma: Secondary | ICD-10-CM | POA: Diagnosis present

## 2022-03-25 DIAGNOSIS — F4312 Post-traumatic stress disorder, chronic: Secondary | ICD-10-CM | POA: Diagnosis not present

## 2022-03-25 DIAGNOSIS — G8929 Other chronic pain: Secondary | ICD-10-CM | POA: Diagnosis present

## 2022-03-25 DIAGNOSIS — R410 Disorientation, unspecified: Secondary | ICD-10-CM | POA: Diagnosis not present

## 2022-03-25 DIAGNOSIS — K5080 Crohn's disease of both small and large intestine without complications: Secondary | ICD-10-CM | POA: Diagnosis present

## 2022-03-25 DIAGNOSIS — Z466 Encounter for fitting and adjustment of urinary device: Secondary | ICD-10-CM | POA: Diagnosis not present

## 2022-03-25 DIAGNOSIS — F32A Depression, unspecified: Secondary | ICD-10-CM | POA: Diagnosis present

## 2022-03-25 DIAGNOSIS — F332 Major depressive disorder, recurrent severe without psychotic features: Secondary | ICD-10-CM | POA: Diagnosis not present

## 2022-03-25 DIAGNOSIS — N201 Calculus of ureter: Secondary | ICD-10-CM | POA: Diagnosis not present

## 2022-03-25 DIAGNOSIS — R279 Unspecified lack of coordination: Secondary | ICD-10-CM | POA: Insufficient documentation

## 2022-03-25 DIAGNOSIS — R569 Unspecified convulsions: Secondary | ICD-10-CM | POA: Diagnosis not present

## 2022-03-25 DIAGNOSIS — G9341 Metabolic encephalopathy: Secondary | ICD-10-CM | POA: Diagnosis not present

## 2022-03-25 DIAGNOSIS — G4733 Obstructive sleep apnea (adult) (pediatric): Secondary | ICD-10-CM | POA: Diagnosis present

## 2022-03-25 DIAGNOSIS — D649 Anemia, unspecified: Secondary | ICD-10-CM | POA: Diagnosis not present

## 2022-03-25 DIAGNOSIS — R41841 Cognitive communication deficit: Secondary | ICD-10-CM | POA: Diagnosis not present

## 2022-03-25 DIAGNOSIS — R296 Repeated falls: Secondary | ICD-10-CM | POA: Insufficient documentation

## 2022-03-25 DIAGNOSIS — J189 Pneumonia, unspecified organism: Secondary | ICD-10-CM | POA: Diagnosis not present

## 2022-03-25 DIAGNOSIS — C44329 Squamous cell carcinoma of skin of other parts of face: Secondary | ICD-10-CM | POA: Diagnosis not present

## 2022-03-25 DIAGNOSIS — M79601 Pain in right arm: Secondary | ICD-10-CM | POA: Diagnosis present

## 2022-03-25 DIAGNOSIS — R0902 Hypoxemia: Secondary | ICD-10-CM | POA: Diagnosis not present

## 2022-03-25 DIAGNOSIS — A419 Sepsis, unspecified organism: Secondary | ICD-10-CM | POA: Diagnosis not present

## 2022-03-25 DIAGNOSIS — Z885 Allergy status to narcotic agent status: Secondary | ICD-10-CM | POA: Diagnosis not present

## 2022-03-25 DIAGNOSIS — J310 Chronic rhinitis: Secondary | ICD-10-CM | POA: Diagnosis not present

## 2022-03-25 DIAGNOSIS — Z8719 Personal history of other diseases of the digestive system: Secondary | ICD-10-CM | POA: Diagnosis not present

## 2022-03-25 DIAGNOSIS — K50919 Crohn's disease, unspecified, with unspecified complications: Secondary | ICD-10-CM | POA: Diagnosis not present

## 2022-03-25 DIAGNOSIS — R6520 Severe sepsis without septic shock: Secondary | ICD-10-CM | POA: Diagnosis not present

## 2022-03-25 DIAGNOSIS — N130 Hydronephrosis with ureteropelvic junction obstruction: Secondary | ICD-10-CM | POA: Diagnosis not present

## 2022-03-25 DIAGNOSIS — Z9181 History of falling: Secondary | ICD-10-CM | POA: Diagnosis not present

## 2022-03-25 NOTE — Therapy (Addendum)
OUTPATIENT PHYSICAL THERAPY TREATMENT NOTE AND LATE ENTRY DISCHARGE SUMMARY   Patient Name: [name] [name] MRN: [id number] DOB:Apr 17, 1961, 61 y.o., male Today's Date: 03/25/2022   PT End of Session - 03/25/22 1239     Visit Number 4    Date for PT Re-Evaluation 05/01/22    Authorization Type Medicare    Progress Note Due on Visit 10    PT Start Time 1239    PT Stop Time 1311    PT Time Calculation (min) 32 min    Activity Tolerance Patient tolerated treatment well    Behavior During Therapy [facility] for tasks assessed/performed              Past Medical History:  Diagnosis Date   Adenomatous colon polyp 10/1999   Anxiety    Arthritis    neck, yoga helps.   Cataract    Complex partial seizure ([place])    last seizure was 08-30-2018   Crohn's disease of small and large intestines ([place]) 1999   Depression    Diabetes mellitus    no meds at this time 10-24-17   Esophageal stricture    External hemorrhoids    GERD (gastroesophageal reflux disease)    Glaucoma    History of kidney stones    3 large stones still present   Hyperlipemia    Hypertension    resolved with weight loss    Inguinal hernia, bilateral 12/2019   Liver cirrhosis secondary to NASH [facility])    Migraines    Osteoporosis    Pathological fracture of left hip due to osteoporosis ([place]) 12/18/2021   PONV (postoperative nausea and vomiting)    PTSD (post-traumatic stress disorder)    Renal cyst, acquired, left 07/08/2017   Skin cancer    squamous cell multiple; [place]; followed every 3 months.   Sleep apnea    CPAP machine, uses nightly   Staphylococcus aureus bacteremia with sepsis ([place]) 05/28/2017   MRSA 2012   Past Surgical History:  Procedure Laterality Date   CATARACT EXTRACTION Bilateral    COLONOSCOPY     CYSTOSCOPY W/ URETERAL STENT PLACEMENT Right 03/18/2022   Procedure: CYSTOSCOPY WITH RETROGRADE PYELOGRAM, URETEROSCOPY;  Surgeon: Remi Haggard, MD;  Location: [facility];  Service: Urology;   Laterality: Right;   DEBRIDEMENT MANDIBLE N/A 12/07/2020   Procedure: DEBRIDEMENT OF MANDIBLE;  Surgeon: [name], DMD;  Location: [facility];  Service: Oral Surgery;  Laterality: N/A;   ELBOW SURGERY  2012   elbow MRSA infection    EYE SURGERY     cataracts removed, /w IOL   GROSS TEETH DEBRIDEMENT N/A 12/29/2020   Procedure: MANDIBULAR  DEBRIDEMENT;  Surgeon: [name], DMD;  Location: [facility];  Service: Dentistry;  Laterality: N/A;   INCISION AND DRAINAGE ABSCESS N/A 12/07/2020   Procedure: INCISION AND DRAINAGE MANDIBULAR ABSCESS;  Surgeon: [name], DMD;  Location: [facility];  Service: Oral Surgery;  Laterality: N/A;   INGUINAL HERNIA REPAIR Right 01/14/2020   Procedure: OPEN RIGHT HERNIA REPAIR INGUINAL WITH MESH;  Surgeon: [name], MD;  [facility];  Service: General;  Laterality: Right;   INSERTION OF MESH N/A 07/01/2016   Procedure: INSERTION OF MESH;  Surgeon: [name], MD;  Location: [facility];  Service: General;  Laterality: N/A;   INTRAMEDULLARY (IM) NAIL INTERTROCHANTERIC Left 12/17/2021   Procedure: INTRAMEDULLARY (IM) NAIL INTERTROCHANTRIC;  Surgeon: Altamese Bogue Chitto, MD;  Location: [facility];  Service: Orthopedics;  Laterality: Left;   IR URETERAL  STENT LEFT NEW ACCESS W/O SEP NEPHROSTOMY CATH  02/02/2018   NEPHROLITHOTOMY Left 02/02/2018   Procedure: NEPHROLITHOTOMY PERCUTANEOUS;  Surgeon: [name], MD;  [facility];  Service: Urology;  Laterality: Left;   POLYPECTOMY     SCALP LACERATION REPAIR Right 10/16/2017   From fall/staples   SHOULDER ARTHROSCOPY Right 03/05/2018   Procedure: ARTHROSCOPY SHOULDER AND OPEN DISTAL CLAVICLE EXCISION;  Surgeon: [name], MD;  Location: [facility];  Service: Orthopedics;  Laterality: Right;   SHOULDER SURGERY     SINUS SURGERY WITH INSTATRAK     TEE WITHOUT CARDIOVERSION N/A 05/09/2017   Procedure: TRANSESOPHAGEAL ECHOCARDIOGRAM (TEE);  Surgeon: [name], MD;  Location: [facility];  Service:  Cardiovascular;  Laterality: N/A;   TOOTH EXTRACTION N/A 12/07/2020   Procedure: DENTAL EXTRACTIONS OF TEETH TWENTY TO THIRTY;  Surgeon: [name], DMD;  Location: [facility];  Service: Oral Surgery;  Laterality: N/A;   TOOTH EXTRACTION N/A 12/29/2020   Procedure: DENTAL RESTORATION/EXTRACTIONS;  Surgeon: [name], DMD;  Location: [facility];  Service: Dentistry;  Laterality: N/A;   TRANSTHORACIC ECHOCARDIOGRAM  02/22/2011   EF 55-65%; increased pattern of LVH with mild conc hypertrophy, abnormal relaxation & increased filling pressure (grade 2 diastolic dysfunction); atrial septum thickened (lipomatous hypertrophy)   UMBILICAL HERNIA REPAIR N/A 07/01/2016   Procedure: UMBILICAL HERNIA REPAIR WITH MESH;  Surgeon: [name], MD;  Location: [facility] [facility];  Service: General;  Laterality: N/A;   UPPER GASTROINTESTINAL ENDOSCOPY     VASECTOMY     Patient Active Problem List   Diagnosis Date Noted   Cirrhosis of liver without ascites ([place])    Urinary tract infection without hematuria    Sepsis secondary to UTI ([place]) 03/17/2022   Hyperbilirubinemia 03/17/2022   Hydronephrosis of right kidney 03/17/2022   Nephrolith 03/17/2022   Protein-calorie malnutrition, severe 12/19/2021   Pathological fracture of left hip due to osteoporosis ([place]) 12/18/2021   Closed nondisplaced intertrochanteric fracture of left femur, initial encounter ([place]) 12/17/2021   Rib fracture 12/17/2021   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 05/11/2021   Liver cirrhosis secondary to NASH with liver mass     GERD (gastroesophageal reflux disease)    Pancytopenia ([place])    Osteomyelitis of jaw 12/29/2020   Acute osteomyelitis of mandible 12/26/2020   Sepsis ([place]) 12/04/2020   S/P inguinal hernia repair 01/14/2020   At high risk for falls    S/P shoulder surgery 03/05/2018   Excessive daytime sleepiness 02/18/2018   Crohn's disease of colon with rectal bleeding ([place]) 02/18/2018   Iron deficiency anemia due to  chronic blood loss and thrombocytopenia  02/18/2018   Iron deficiency anemia due to sideropenic dysphagia 02/18/2018   Renal calculus, left 02/02/2018   History of sepsis 12/16/2017   Renal cyst, acquired, left 07/08/2017   Frequent falls 07/08/2017   Renal lesion 05/28/2017   MSSA bacteremia 05/06/2017   Depression 04/30/2016   Memory loss 04/30/2016   OSA on CPAP 10/31/2015   Nonepileptic episode ([place]) 04/25/2015   Seizure ([place]) 04/04/2013   DOE (dyspnea on exertion) 03/12/2013   Complex partial seizure ([place]) 04/19/2012   DM2 (diabetes mellitus, type 2) ([place]) 09/25/2011   OTHER DYSPHAGIA 03/27/2009   GERD 12/07/2007   EXTERNAL HEMORRHOIDS 09/11/2007   CROHN'S DISEASE, LARGE AND SMALL INTESTINES 09/11/2007   OSTEOPOROSIS 09/11/2007    PCP: [name], MD   REFERRING PROVIDER: [name], MD   REFERRING DIAG: R29.6 (ICD-10-CM) - Repeated falls S72.009A (ICD-10-CM) -  Hip fracture ([place])   THERAPY DIAG:  Other abnormalities of gait and mobility  Muscle weakness (generalized)  Repeated falls  Unspecified lack of coordination  Lack of coordination  Rationale for Evaluation and Treatment Rehabilitation  ONSET DATE: 12/17/2021  SUBJECTIVE:   SUBJECTIVE STATEMENT: I am struggling cognitivley today. Hip is just ok. Sorry I am late.  PERTINENT HISTORY: Seizure disorder, chron's, T2DM, depression and anxiety, GERD with hx of esophageal stricture, HTN, HLD, liver cirrhosis secondary to NASH, OSA on cpap, keratinizing SCC of the left cheek currently on neoadjuvant cemiplimab. Surgical resection on 09/18/21.  PAIN:  Are you having pain? Yes: NPRS scale: 6-8/10 Pain location: right shoulder, left hip Pain description: throbbing Aggravating factors: increased activity Relieving factors: medication  PRECAUTIONS: Fall  WEIGHT BEARING RESTRICTIONS No  FALLS:  Has patient fallen in last 6 months? Yes. Number of falls 1 major fall and several losses of balance  LIVING  ENVIRONMENT: Lives with: lives with their spouse Lives in: House/apartment Stairs: Yes: Internal: 14 steps; on right going up and External: 2 steps; on left going up Has following equipment at home: Single point cane, Environmental consultant - 2 wheeled, Wheelchair (manual), Electronics engineer, and Grab bars  OCCUPATION: retired  PLOF: Independent with household mobility with device  PATIENT GOALS:  Decrease risk of falling, improve balance, and get stronger.   OBJECTIVE:   DIAGNOSTIC FINDINGS:  Left hip radiograph on 12/17/21: IMPRESSION: Mildly displaced intertrochanteric fracture of the left hip  PATIENT SURVEYS:  LEFS TBA  COGNITION:  Overall cognitive status: Within functional limits for tasks assessed     MUSCLE LENGTH: Tightness in bilat hamstrings  POSTURE: rounded shoulders and forward head  PALPATION: Some tenderness to palpation along right shoulder and left hip  LOWER EXTREMITY ROM:  WFL  LOWER EXTREMITY MMT:  03/06/2022: Right shoulder strength of 4/5 Left hip/knee strength of 4/5 Right hip strength of 4+/5, Left knee strength of 5/5  03/08/2022: 3 minute walk test:  499 ft without assistive device with SBA   FUNCTIONAL TESTS:  03/06/2022: 5 times sit to stand: 22.4 sec with UE use .  Pt with increased difficulty on first stand.  GAIT: Distance walked: 100 ft Assistive device utilized: Single point cane Level of assistance: SBA Comments: Pt with antalgic gait with some unsteadiness noted    TODAY'S TREATMENT:   03/25/22: Blue loop clamshells seated 15x 2# marching 10x Bil  LAQ 2# Bil 10x  Seated bicep curls 2# 10x Shoulder lifts 2# 10x Bil Side stepping at BArre with blue loop above knees 4x Standing heel raises 15x:  Sit to stand 75% LE 25% UE 2x 5 as legs were getting very wobbly Ambulated down hall way with RW due to LE instability post exercises with SBA/CGA for safety. Pt picks up walker often and walks very fast. VC for control and keeping walker on  floor.       03/15/22: Nustep level 3 x 8 min with PTA present to discuss status LAQ 0# 10x, added 1.5# 10x Bil Seated clamshell blue loop 10x Seated march 1.5# 10x Sit to stand LE 70%/UE 30% 10x Blue loop clamshells 10x Standing at [place]: calf raises 10x, hip abduction 10x, side stepping with sliding of hands 4 lengths,  1# shoulder 3 ways 10x each alternating  03/08/2022: Nustep level 3 x7 min with PT present to discuss status Seated with 2: heel/toe raises, LAQ, marching, hip abduction scissors, hip adduction ball squeeze, hip ER.  BLE 2x10 bilat each Sit to/from holding  5# kettlebell:  x10 with chest press, x10 with overhead press Standing and performing ball toss x20 throws 3 min walk test:  499 ft without assistive device with SBA and occasional veering off path and brushing against walls   03/06/2022:  Reviewed HEP and safety at home with use of [facility]   PATIENT EDUCATION:  Education details: Issued HEP Person educated: Patient and Spouse Education method: Explanation, Demonstration, and Handouts Education comprehension: verbalized understanding   HOME EXERCISE PROGRAM: Access Code: [email or web address] URL: [email or web address] Date: 03/06/2022 Prepared by: Juel Burrow  Exercises - Seated Long Arc Quad  - 1 x daily - 7 x weekly - 2 sets - 10 reps - Seated March  - 1 x daily - 7 x weekly - 2 sets - 10 reps - Standing March with Counter Support  - 1 x daily - 7 x weekly - 2 sets - 10 reps - Standing Hip Abduction with Counter Support  - 1 x daily - 7 x weekly - 2 sets - 10 reps  ASSESSMENT:  CLINICAL IMPRESSION: Pt arrives with his wife late for todays appt bc they just came from another MD appt. Pt reports he did not feel good "cognitively" but his legs were ok. LE became very unstable after standing heel raises. We used RW for ambulation towards end of session due to the LE instability.   OBJECTIVE IMPAIRMENTS decreased balance, decreased coordination,  difficulty walking, decreased strength, dizziness, impaired flexibility, postural dysfunction, and pain.   ACTIVITY LIMITATIONS carrying, lifting, bending, squatting, and stairs  PARTICIPATION LIMITATIONS: community activity and yard work  [place], Time since onset of injury/illness/exacerbation, and 3+ comorbidities: Hx of cancer, dizziness, Seizure disorder  are also affecting patient's functional outcome.   REHAB POTENTIAL: Good  CLINICAL DECISION MAKING: Evolving/moderate complexity  EVALUATION COMPLEXITY: Moderate   GOALS: Goals reviewed with patient? Yes  SHORT TERM GOALS: Target date: 03/27/2022  Pt will be independent with initial HEP. Baseline: Goal status: Goal met 03/15/22  2.  Pt will be able to perform sit to/from stand from various surfaces independent with good safety on first attempt and independent. Baseline:  Goal status: INITIAL   LONG TERM GOALS: Target date: 05/01/2022   Pt will be independent with advanced HEP. Baseline:  Goal status: INITIAL  2.  Pt will increase left hip and right shoulder strength to at least 4+/5 to allow him to perform household tasks with improved ease. Baseline:  Goal status: INITIAL  3.  Pt will decrease 5 times sit to/from stand time to in 15 seconds or less to indicate improved functional mobility. Baseline:  Goal status: INITIAL  4.  Pt will be able to ambulate at least 30 minutes to walk the dog without a loss of balance and without increased hip pain. Baseline:  Goal status: INITIAL  5.  Pt and wife will report improved safety at home with no falls/losses of balance in week prior to discharge. Baseline:  Goal status: INITIAL   PLAN: PT FREQUENCY: 1-2x/week  PT DURATION: 8 weeks  PLANNED INTERVENTIONS: Therapeutic exercises, Therapeutic activity, Neuromuscular re-education, Balance training, Gait training, Patient/Family education, Self Care, Joint mobilization, Joint manipulation, Stair training,  Aquatic Therapy, Dry Needling, Electrical stimulation, Spinal manipulation, Spinal mobilization, Cryotherapy, Moist heat, Taping, Ultrasound, Ionotophoresis 53m/ml Dexamethasone, Manual therapy, and Re-evaluation  PLAN FOR NEXT SESSION: assess and progress with HEP as indicated, strengthening, balance   Yael Coppess, PTA 03/25/2022, 1:12 PM   [facility] [address] [place]  [address] Phone # [phone] Fax [phone]   PHYSICAL THERAPY DISCHARGE SUMMARY  As of 04/05/2022, pt was admitted to [facility] secondary to multiple rehospitalizations and associated weakness.  Patient agrees to discharge. Patient goals were not met. Patient is being discharged due to a change in medical status.

## 2022-03-25 NOTE — Progress Notes (Signed)
[facility] Pall[facility] Care Consult Note Telephone: [phone]  Fax: [phone]   Date of encounter: 03/25/22 2:36 PM PATIENT NAME: [name] [name] [place] [address]   [phone] (home)  DOB: May 01, 1961 MRN: [id number] PRIMARY CARE PROVIDER:    [name], MD,  [place] [address] [phone]  REFERRING PROVIDER:   [name], MD [address] [place],  [place] [address] [phone]  RESPONSIBLE PARTY:    Contact Information     Name Relation Home Work Mobile   [place] Spouse [phone]  [phone]   Haidar, Muse Daughter [phone]  [phone]      I connected with  [name] [name] and his wife [name] [name] on 03/25/22 by a video enabled telemedicine application and verified that I am speaking with the correct person using two identifiers.   I discussed the limitations of evaluation and management by telemedicine. The patient expressed understanding and agreed to proceed.   Palliative Care was asked to follow this patient by consultation request of  [name], MD to address advance care planning and complex medical decision making. This is the initial visit.          ASSESSMENT, SYMPTOM MANAGEMENT AND PLAN / RECOMMENDATIONS:   Hepatocellular carcinoma due to NASH cirrhosis Continues follow up with GI due to Crohn's and NASH cirrhosis. Continues Xifaxan Encouraged pt to be open to Hospice to help with symptom management at the appropriate time of his choosing, to consider and discuss with family those things that were meaningful to him for quality of life and to focus on doing anything that he felt up to doing. Advised pt and wife of benefits of hospice for pt/family support with ADLs and symptom monitoring and management. Has several wishes, will send SW referral to see if there are any resources to help with any of his requests.  2.    Frequent  Falls Continues working with outpatient PT. Encouraged for now to use rollator to improve stability Currently has multiple recent rib fractures without evidence of metastasis.  3.    Pancytopenia Stable PLT count about 102, WBC at 3.4, HGB 10.  Counts have improved since June 2023. Monitor for s/sx of bleeding, avoid crowds  Follow up Palliative Care Visit: Palliative care will continue to follow for complex medical decision making, advance care planning, and clarification of goals. Return 3-4 weeks or prn.    This visit was coded based on medical decision making (MDM).  PPS: 60%  HOSPICE ELIGIBILITY/DIAGNOSIS: TBD  Chief Complaint:  [facility] received a referral to follow up with patient for chronic disease management of hepatocellular carcinoma in setting of liver cirrhosis due to NASH.  Palliative Care is also following for advance directive planning and defining/refining goals of care.  HISTORY OF PRESENT ILLNESS:  JERAMIE SCOGIN is a 61 y.o. year old male with hepatocellular carcinoma secondary to NASH with cirrhosis. 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   (About 1 year ago) Has confirmed primary liver cancer ([place]). Lost 30-35 lbs in last 6 months. Ablated smaller lesion, put radioactive beads into larger lesion.  Denies having any appetite but wife states that eating is improving. Oncologist Is Dr Harriette Ohara (head and neck cancer), Dr Manuela Neptune (liver), both at [facility]. Pt also has pancytopenia and osteoporosis so he bruises easily  and has had multiple rib fractures from falls as well as a hip fracture in June.  Denies bright red or dark tarry stools.  Inpatient last week due to kidney stones and hematuria.  Has a lot of falls and issues with balance.  Using a rollator. Has a hx of seizures controlled from physical  perspective with medication but has had cognitive decline.  His  physical expression of seizures used to be staring and lip smacking in past years. Doing PT outpatient.  Has some urge incontinence of urine, some in bladder. Has pain from broken ribs, has osteoporosis from Crohn's. Eating soft foods but had osteomyelitis of lower jaw and had teeth removed last year.  He would have some problem with something other soft foods. Has a special needs child aged 40 who is the age of a "toddler" per wife. Wife just finished chemo for breast cancer and going to have surgery in the next few weeks. They have in home caregivers that help during the day with care of their son but have no one to help with him for overnight so they can do some of the things they still want to do.  Pt mentioned [place], said he would like to go deep sea fishing or visit [place].  His wife also mentioned that they were avid [facility] fans and used to go to games all the time but would like to go either to a football or basketball game. Advised pt and wife that Palliative also addreses goals of care and where the patient would like for that care to occur. Wife states he didn't want to know about time frames but then asked his Oncologist who told him without any treatment he might live 2-3 months but with treatment he could potentially have 1-2 years.  She states he was upset with himself at that point for asking. She states he has been having trouble with lightheadedness and using his rollator outside the home and some inside the home if dizzy but otherwise had some walking sticks he used.  He states that PT is trying to get him conditioned/strong enough to where he can walk without the rollator.  History obtained from review of EMR, discussion with wife, [name] [name],  and/or Mr. Ogata.     Latest Ref Rng & Units 03/21/2022    6:03 AM 03/20/2022    6:01 AM 03/19/2022    6:16 AM  CBC  WBC 4.0 - 10.5 K/uL 3.4  3.2  4.0   Hemoglobin 13.0 - 17.0 g/dL 10.0  9.7  9.9   Hematocrit 39.0 -  52.0 % 30.6  29.4  30.0   Platelets 150 - 400 K/uL 102  100  85        Latest Ref Rng & Units 03/21/2022    6:03 AM 03/20/2022    6:01 AM 03/19/2022    6:16 AM  CMP  Glucose 70 - 99 mg/dL 81  79  87   BUN 6 - 20 mg/dL 7  9  11    Creatinine 0.61 - 1.24 mg/dL 0.64  0.56  0.59   Sodium 135 - 145 mmol/L 142  143  140   Potassium 3.5 - 5.1 mmol/L 3.4  3.8  3.3   Chloride 98 - 111 mmol/L 113  111  109   CO2 22 - 32 mmol/L 24  26  24    Calcium 8.9 - 10.3 mg/dL 8.7  8.4  8.4   Total Protein  6.5 - 8.1 g/dL 5.6  5.2  5.4   Total Bilirubin 0.3 - 1.2 mg/dL 1.1  1.1  1.3   Alkaline Phos 38 - 126 U/L 307  288  287   AST 15 - 41 U/L 35  38  39   ALT 0 - 44 U/L 26  27  26      9/26-9/28/23 Last 3 albumins:  3.0, 2.7, 2.8  03/17/22  CT Chest, abd and pelvis with contrast: IMPRESSION: 1. Cirrhotic liver morphology with evidence of portal hypertension including splenomegaly. 2. Significant interval enlargement of poorly defined hypoattenuating mass at the right hepatic dome now measuring up to 10.4 x 4.5 x 6.6 cm, previously measured approximately 3.0 cm on 11/30/2021. Findings are concerning for hepatocellular carcinoma. Further evaluation with contrast-enhanced hepatic protocol MRI is recommended, preferably on a nonemergent, outpatient basis. 3. Mild right-sided hydronephrosis with a questionable punctate stone at the right ureterovesical junction. 4. Multiple nonobstructing left renal stones including a 1.1 cm stone within the renal pelvis. 5. Nondisplaced fractures of the lateral right sixth, seventh, and eighth ribs, which appear acute. Subacute healing fractures of the anterolateral left fifth through seventh ribs. No pneumothorax.   CT head and cervical spine wo contrast: IMPRESSION: 1. No acute intracranial abnormalities. Volume loss with prominence of the sulci. 2. 3 mm anterolisthesis of C6 versus C7 is nonacute. No fracture or acute traumatic malalignment. Degenerative  changes. Portable CXR: IMPRESSION: No active disease.  12/17/21 HGB A1c 4.6% (highest was 8.6% 09/25/2011)  I reviewed EMR for available labs, medications, imaging, studies and related documents.  Records reviewed and summarized above.   ROS General: NAD ENMT: denies dysphagia with soft foods Cardiovascular: denies chest pain, denies DOE Pulmonary: denies cough, denies increased SOB Abdomen: endorses poor appetite, denies constipation, endorses continence of bowel GU: denies dysuria, endorses continence of urine, MSK:  endorses increased weakness, right shoulder pain, problems with balance and stability with falls reported Skin: denies rashes or wounds Neurological: endorses intermittent right shoulder pain which he states has been related to his liver cancer, denies insomnia Psych: Endorses positive mood Heme/lymph/immuno: easy bruising, no current abnormal bleeding  Physical Exam: Current and past weights: 145 lbs as of 03/18/22, 164 lbs as of 10/31/21 Constitutional: NAD General: frail appearing, thin CV: No visible cyanosis Pulmonary:  no noted cough/wheezing audible, room air MSK: moves all extremities, ambulatory Neuro: Short term memory deficits Psych: non-anxious affect, A and O x 3   CURRENT PROBLEM LIST:  Patient Active Problem List   Diagnosis Date Noted   Cirrhosis of liver without ascites ([place])    Urinary tract infection without hematuria    Sepsis secondary to UTI ([place]) 03/17/2022   Hyperbilirubinemia 03/17/2022   Hydronephrosis of right kidney 03/17/2022   Nephrolith 03/17/2022   Protein-calorie malnutrition, severe 12/19/2021   Pathological fracture of left hip due to osteoporosis ([place]) 12/18/2021   Closed nondisplaced intertrochanteric fracture of left femur, initial encounter ([place]) 12/17/2021   Rib fracture 12/17/2021   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 05/11/2021   Liver cirrhosis secondary to NASH with liver mass     GERD (gastroesophageal  reflux disease)    Pancytopenia ([place])    Osteomyelitis of jaw 12/29/2020   Acute osteomyelitis of mandible 12/26/2020   Sepsis ([place]) 12/04/2020   S/P inguinal hernia repair 01/14/2020   At high risk for falls    S/P shoulder surgery 03/05/2018   Excessive daytime sleepiness 02/18/2018   Crohn's disease of colon with rectal  bleeding ([place]) 02/18/2018   Iron deficiency anemia due to chronic blood loss and thrombocytopenia  02/18/2018   Iron deficiency anemia due to sideropenic dysphagia 02/18/2018   Renal calculus, left 02/02/2018   History of sepsis 12/16/2017   Renal cyst, acquired, left 07/08/2017   Frequent falls 07/08/2017   Renal lesion 05/28/2017   MSSA bacteremia 05/06/2017   Depression 04/30/2016   Memory loss 04/30/2016   OSA on CPAP 10/31/2015   Nonepileptic episode ([place]) 04/25/2015   Seizure ([place]) 04/04/2013   DOE (dyspnea on exertion) 03/12/2013   Complex partial seizure ([place]) 04/19/2012   DM2 (diabetes mellitus, type 2) ([place]) 09/25/2011   OTHER DYSPHAGIA 03/27/2009   GERD 12/07/2007   EXTERNAL HEMORRHOIDS 09/11/2007   CROHN'S DISEASE, LARGE AND SMALL INTESTINES 09/11/2007   OSTEOPOROSIS 09/11/2007   PAST MEDICAL HISTORY:  Active Ambulatory Problems    Diagnosis Date Noted   EXTERNAL HEMORRHOIDS 09/11/2007   GERD 12/07/2007   CROHN'S DISEASE, LARGE AND SMALL INTESTINES 09/11/2007   OSTEOPOROSIS 09/11/2007   OTHER DYSPHAGIA 03/27/2009   DM2 (diabetes mellitus, type 2) ([place]) 09/25/2011   Complex partial seizure ([place]) 04/19/2012   DOE (dyspnea on exertion) 03/12/2013   Seizure ([place]) 04/04/2013   Nonepileptic episode ([place]) 04/25/2015   OSA on CPAP 10/31/2015   Depression 04/30/2016   Memory loss 04/30/2016   MSSA bacteremia 05/06/2017   Renal lesion 05/28/2017   Renal cyst, acquired, left 07/08/2017   Frequent falls 07/08/2017   History of sepsis 12/16/2017   Renal calculus, left 02/02/2018   Excessive daytime sleepiness 02/18/2018   Crohn's disease of  colon with rectal bleeding ([place]) 02/18/2018   Iron deficiency anemia due to chronic blood loss and thrombocytopenia  02/18/2018   Iron deficiency anemia due to sideropenic dysphagia 02/18/2018   S/P shoulder surgery 03/05/2018   At high risk for falls    S/P inguinal hernia repair 01/14/2020   Sepsis ([place]) 12/04/2020   Acute osteomyelitis of mandible 12/26/2020   Osteomyelitis of jaw 12/29/2020   Liver cirrhosis secondary to NASH with liver mass     GERD (gastroesophageal reflux disease)    Pancytopenia ([place])    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 05/11/2021   Closed nondisplaced intertrochanteric fracture of left femur, initial encounter ([place]) 12/17/2021   Rib fracture 12/17/2021   Pathological fracture of left hip due to osteoporosis ([place]) 12/18/2021   Protein-calorie malnutrition, severe 12/19/2021   Sepsis secondary to UTI ([place]) 03/17/2022   Hyperbilirubinemia 03/17/2022   Hydronephrosis of right kidney 03/17/2022   Nephrolith 03/17/2022   Cirrhosis of liver without ascites ([place])    Urinary tract infection without hematuria    Resolved Ambulatory Problems    Diagnosis Date Noted   Hyperlipidemia 02/27/2007   Essential hypertension 02/27/2007   Cough 11/10/2008   TRANSAMINASES, SERUM, ELEVATED 03/27/2009   COLONIC POLYPS, ADENOMATOUS, HX OF 12/07/2007   PRE-DIABETES 07/23/2010   Altered mental status 09/25/2011   Obstructive sleep apnea 10/01/2012   Chest pain 03/12/2013   Abnormal EKG 03/12/2013   Syncope 08/30/2016   Confusion 04/02/2017   Bacteremia due to Staphylococcus aureus 05/06/2017   Staphylococcus aureus bacteremia with sepsis ([place]) 05/28/2017   Therapeutic drug monitoring 10/07/2017   Fatigue 12/16/2017   Blood in stool 02/18/2018   Facial cellulitis 12/05/2020   Past Medical History:  Diagnosis Date   Adenomatous colon polyp 10/1999   Anxiety    Arthritis    Cataract    Crohn's disease of small and large intestines ([place])  1999   Diabetes  mellitus    Esophageal stricture    External hemorrhoids    Glaucoma    History of kidney stones    Hyperlipemia    Hypertension    Inguinal hernia, bilateral 12/2019   Migraines    Osteoporosis    PONV (postoperative nausea and vomiting)    PTSD (post-traumatic stress disorder)    Skin cancer    Sleep apnea    SOCIAL HX:  Social History   Tobacco Use   Smoking status: Never    Passive exposure: Never   Smokeless tobacco: Never  Substance Use Topics   Alcohol use: Never   FAMILY HX:  Family History  Problem Relation Age of Onset   Heart disease Mother        CABG at age 88   Hyperlipidemia Mother    Hypertension Mother    Diabetes Father    Colon polyps Father    Stroke Father    Hyperlipidemia Father    Hypertension Father    Stroke Paternal Grandmother    Stroke Paternal Grandfather    Other Child        tetrology of fallot (cornealia deland syndrome)   Colon cancer Neg Hx    Esophageal cancer Neg Hx    Stomach cancer Neg Hx    Rectal cancer Neg Hx        Preferred Pharmacy: ALLERGIES:  Allergies  Allergen Reactions   Sulfonamide Derivatives Hives and Rash   Z-Pak [Azithromycin] Swelling   Dilaudid [Hydromorphone Hcl] Nausea And Vomiting   Sulfa Antibiotics Hives and Rash     PERTINENT MEDICATIONS:  Outpatient Encounter Medications as of 03/25/2022  Medication Sig   acetaminophen (TYLENOL) 500 MG tablet Take 2 tablets (1,000 mg total) by mouth 3 (three) times daily. (Patient taking differently: Take 1,000 mg by mouth at bedtime.)   acitretin ([name]) 25 MG capsule Take 25 mg by mouth daily.   CALCIUM PO Take 1 tablet by mouth 2 (two) times daily.   cefdinir (OMNICEF) 300 MG capsule Take 1 capsule (300 mg total) by mouth every 12 (twelve) hours for 5 days.   cemiplimab-rwlc (LIBTAYO) 350 MG/7ML SOLN injection Inject into the vein See admin instructions. Every 4-6 weeks   chlorhexidine (PERIDEX) 0.12 % solution Use as directed 15 mLs in the mouth or  throat 2 (two) times daily.   Cholecalciferol (VITAMIN D-3 PO) Take 1 capsule by mouth daily.   cycloSPORINE (RESTASIS) 0.05 % ophthalmic emulsion Place 1 drop into both eyes 2 (two) times daily as needed (dry eyes).   desonide (DESOWEN) 0.05 % ointment Apply 1 Application topically See admin instructions. Apply to affected area of lower lip 3 times a day as needed for irritation- after mixing with Mupirocin 2% ointment   esomeprazole (NEXIUM) 40 MG capsule Take 1 capsule (40 mg total) by mouth 2 (two) times daily before a meal.   Eszopiclone 3 MG TABS Take 3 mg by mouth at bedtime.   feeding supplement (ENSURE ENLIVE / ENSURE PLUS) LIQD Take 237 mLs by mouth 2 (two) times daily between meals.   folic acid (FOLVITE) 1 MG tablet Take 2 tablets (2 mg total) by mouth daily.   gabapentin (NEURONTIN) 300 MG capsule Take 300 mg by mouth in the morning and at bedtime.   hydrocortisone 2.5 % cream Apply 1 Application topically 2 (two) times daily as needed (Itchy skin).   Lactase (DAIRY RELIEF PO) Take 1 tablet by mouth in the morning and  at bedtime.   lamoTRIgine (LAMICTAL) 150 MG tablet Take 1 tablet (150 mg total) by mouth 2 (two) times daily.   latanoprost (XALATAN) 0.005 % ophthalmic solution Place 1 drop into both eyes at bedtime.    mesalamine (LIALDA) 1.2 g EC tablet Take 2 tablets (2.4 g total) by mouth 2 (two) times daily. (Patient taking differently: Take 2.4 g by mouth daily.)   methscopolamine (PAMINE FORTE) 5 MG tablet Take 1 tablet (5 mg total) by mouth 2 (two) times daily.   mupirocin ointment (BACTROBAN) 2 % Apply 1 Application topically See admin instructions. Apply to affected area of lower lip three times a day as needed for irritation- after mixing with Desonide 0.05% ointment   MYRBETRIQ 50 MG TB24 tablet Take 50 mg by mouth at bedtime.   oxyCODONE (OXY IR/ROXICODONE) 5 MG immediate release tablet Take 1 tablet (5 mg total) by mouth daily as needed for moderate pain or severe pain.    potassium chloride SA (KLOR-CON) 20 MEQ tablet Take 1 tablet (20 mEq total) by mouth 2 (two) times daily.   Probiotic Product (PROBIOTIC PO) Take 1 tablet at bedtime by mouth.    rifaximin (XIFAXAN) 550 MG TABS tablet Take 1 tablet (550 mg total) by mouth 2 (two) times daily.   sertraline (ZOLOFT) 100 MG tablet Take 200 mg daily with breakfast by mouth.    tamsulosin (FLOMAX) 0.4 MG CAPS capsule Take 0.4 mg by mouth at bedtime.   tolterodine (DETROL LA) 4 MG 24 hr capsule Take 4 mg by mouth every evening.   traZODone (DESYREL) 150 MG tablet Take 150 mg by mouth at bedtime.   Facility-Administered Encounter Medications as of 03/25/2022  Medication   gadopentetate dimeglumine (MAGNEVIST) injection 20 mL     ------------------------------------------------------------------------------------------ Advance Care Planning/Goals of Care: Goals include to maximize quality of life and symptom management. Patient/health care surrogate gave his/her permission to discuss.Our advance care planning conversation included a discussion about:    The value and importance of advance care planning  Experiences with loved ones who have been seriously ill or have died  Exploration of personal, cultural or spiritual beliefs that might influence medical decisions-has disabled son with hx of tetralogy of fallot/cornelia de lange syndrome that lives at home.  Pt wants to live. Wife is being treated actively for breast cancer. Exploration of goals of care in the event of a sudden injury or illness.  Encouraged to think about what he finds meaningful about life Identification  of a healthcare agent-wife [place]: Last code status listed as full code    Thank you for the opportunity to participate in the care of Mr. Doi.  The palliative care team will continue to follow. Please call our office at [phone] if we can be of additional assistance.   Marijo Conception, FNP-C  COVID-19 PATIENT  SCREENING TOOL Asked and negative response unless otherwise noted:  Have you had symptoms of covid, tested positive or been in contact with someone with symptoms/positive test in the past 5-10 days? No

## 2022-03-26 DIAGNOSIS — F4312 Post-traumatic stress disorder, chronic: Secondary | ICD-10-CM | POA: Diagnosis not present

## 2022-03-26 DIAGNOSIS — F332 Major depressive disorder, recurrent severe without psychotic features: Secondary | ICD-10-CM | POA: Diagnosis not present

## 2022-03-26 DIAGNOSIS — F411 Generalized anxiety disorder: Secondary | ICD-10-CM | POA: Diagnosis not present

## 2022-03-27 DIAGNOSIS — K7460 Unspecified cirrhosis of liver: Secondary | ICD-10-CM | POA: Diagnosis not present

## 2022-03-27 DIAGNOSIS — K769 Liver disease, unspecified: Secondary | ICD-10-CM | POA: Diagnosis not present

## 2022-03-27 DIAGNOSIS — Z5112 Encounter for antineoplastic immunotherapy: Secondary | ICD-10-CM | POA: Diagnosis not present

## 2022-03-27 DIAGNOSIS — Z79899 Other long term (current) drug therapy: Secondary | ICD-10-CM | POA: Diagnosis not present

## 2022-03-27 DIAGNOSIS — J310 Chronic rhinitis: Secondary | ICD-10-CM | POA: Diagnosis not present

## 2022-03-27 DIAGNOSIS — C44329 Squamous cell carcinoma of skin of other parts of face: Secondary | ICD-10-CM | POA: Diagnosis not present

## 2022-03-27 DIAGNOSIS — C4492 Squamous cell carcinoma of skin, unspecified: Secondary | ICD-10-CM | POA: Diagnosis not present

## 2022-03-28 ENCOUNTER — Inpatient Hospital Stay (HOSPITAL_COMMUNITY): Payer: Medicare Other

## 2022-03-28 ENCOUNTER — Inpatient Hospital Stay (HOSPITAL_COMMUNITY): Payer: Medicare Other | Admitting: Anesthesiology

## 2022-03-28 ENCOUNTER — Other Ambulatory Visit: Payer: Self-pay

## 2022-03-28 ENCOUNTER — Emergency Department (HOSPITAL_COMMUNITY): Payer: Medicare Other

## 2022-03-28 ENCOUNTER — Encounter (HOSPITAL_COMMUNITY): Payer: Self-pay

## 2022-03-28 ENCOUNTER — Inpatient Hospital Stay (HOSPITAL_COMMUNITY)
Admission: EM | Admit: 2022-03-28 | Discharge: 2022-04-01 | DRG: 853 | Disposition: A | Discharge status: Skilled Nursing Facility | Payer: Medicare Other | Attending: Family Medicine | Admitting: Family Medicine

## 2022-03-28 ENCOUNTER — Encounter (HOSPITAL_COMMUNITY)
Admission: EM | Disposition: A | Discharge status: Skilled Nursing Facility | Payer: Self-pay | Source: Home / Self Care | Attending: Family Medicine

## 2022-03-28 DIAGNOSIS — A419 Sepsis, unspecified organism: Principal | ICD-10-CM | POA: Diagnosis present

## 2022-03-28 DIAGNOSIS — M79601 Pain in right arm: Secondary | ICD-10-CM | POA: Diagnosis present

## 2022-03-28 DIAGNOSIS — Z823 Family history of stroke: Secondary | ICD-10-CM

## 2022-03-28 DIAGNOSIS — I10 Essential (primary) hypertension: Secondary | ICD-10-CM | POA: Diagnosis present

## 2022-03-28 DIAGNOSIS — Z833 Family history of diabetes mellitus: Secondary | ICD-10-CM

## 2022-03-28 DIAGNOSIS — Z882 Allergy status to sulfonamides status: Secondary | ICD-10-CM

## 2022-03-28 DIAGNOSIS — E119 Type 2 diabetes mellitus without complications: Secondary | ICD-10-CM

## 2022-03-28 DIAGNOSIS — Z7401 Bed confinement status: Secondary | ICD-10-CM | POA: Diagnosis not present

## 2022-03-28 DIAGNOSIS — K7460 Unspecified cirrhosis of liver: Secondary | ICD-10-CM | POA: Diagnosis present

## 2022-03-28 DIAGNOSIS — R509 Fever, unspecified: Principal | ICD-10-CM

## 2022-03-28 DIAGNOSIS — G4733 Obstructive sleep apnea (adult) (pediatric): Secondary | ICD-10-CM | POA: Diagnosis present

## 2022-03-28 DIAGNOSIS — K5080 Crohn's disease of both small and large intestine without complications: Secondary | ICD-10-CM | POA: Diagnosis present

## 2022-03-28 DIAGNOSIS — M6259 Muscle wasting and atrophy, not elsewhere classified, multiple sites: Secondary | ICD-10-CM | POA: Diagnosis not present

## 2022-03-28 DIAGNOSIS — Z83438 Family history of other disorder of lipoprotein metabolism and other lipidemia: Secondary | ICD-10-CM

## 2022-03-28 DIAGNOSIS — N136 Pyonephrosis: Secondary | ICD-10-CM | POA: Diagnosis present

## 2022-03-28 DIAGNOSIS — M6281 Muscle weakness (generalized): Secondary | ICD-10-CM | POA: Diagnosis not present

## 2022-03-28 DIAGNOSIS — Z8719 Personal history of other diseases of the digestive system: Secondary | ICD-10-CM | POA: Diagnosis not present

## 2022-03-28 DIAGNOSIS — N1330 Unspecified hydronephrosis: Secondary | ICD-10-CM | POA: Diagnosis not present

## 2022-03-28 DIAGNOSIS — Z466 Encounter for fitting and adjustment of urinary device: Secondary | ICD-10-CM

## 2022-03-28 DIAGNOSIS — H409 Unspecified glaucoma: Secondary | ICD-10-CM | POA: Diagnosis present

## 2022-03-28 DIAGNOSIS — C220 Liver cell carcinoma: Secondary | ICD-10-CM | POA: Diagnosis present

## 2022-03-28 DIAGNOSIS — I700 Atherosclerosis of aorta: Secondary | ICD-10-CM | POA: Diagnosis not present

## 2022-03-28 DIAGNOSIS — N201 Calculus of ureter: Secondary | ICD-10-CM | POA: Diagnosis not present

## 2022-03-28 DIAGNOSIS — Z8614 Personal history of Methicillin resistant Staphylococcus aureus infection: Secondary | ICD-10-CM

## 2022-03-28 DIAGNOSIS — N130 Hydronephrosis with ureteropelvic junction obstruction: Secondary | ICD-10-CM | POA: Diagnosis not present

## 2022-03-28 DIAGNOSIS — Z85828 Personal history of other malignant neoplasm of skin: Secondary | ICD-10-CM | POA: Diagnosis not present

## 2022-03-28 DIAGNOSIS — Z1152 Encounter for screening for COVID-19: Secondary | ICD-10-CM | POA: Diagnosis not present

## 2022-03-28 DIAGNOSIS — Z8249 Family history of ischemic heart disease and other diseases of the circulatory system: Secondary | ICD-10-CM

## 2022-03-28 DIAGNOSIS — J189 Pneumonia, unspecified organism: Secondary | ICD-10-CM | POA: Diagnosis not present

## 2022-03-28 DIAGNOSIS — K7581 Nonalcoholic steatohepatitis (NASH): Secondary | ICD-10-CM | POA: Diagnosis present

## 2022-03-28 DIAGNOSIS — K50919 Crohn's disease, unspecified, with unspecified complications: Secondary | ICD-10-CM | POA: Diagnosis not present

## 2022-03-28 DIAGNOSIS — G40209 Localization-related (focal) (partial) symptomatic epilepsy and epileptic syndromes with complex partial seizures, not intractable, without status epilepticus: Secondary | ICD-10-CM | POA: Diagnosis present

## 2022-03-28 DIAGNOSIS — Z83719 Family history of colon polyps, unspecified: Secondary | ICD-10-CM

## 2022-03-28 DIAGNOSIS — R1311 Dysphagia, oral phase: Secondary | ICD-10-CM | POA: Diagnosis not present

## 2022-03-28 DIAGNOSIS — D500 Iron deficiency anemia secondary to blood loss (chronic): Secondary | ICD-10-CM | POA: Diagnosis present

## 2022-03-28 DIAGNOSIS — K219 Gastro-esophageal reflux disease without esophagitis: Secondary | ICD-10-CM | POA: Diagnosis present

## 2022-03-28 DIAGNOSIS — G9341 Metabolic encephalopathy: Secondary | ICD-10-CM | POA: Diagnosis present

## 2022-03-28 DIAGNOSIS — M4696 Unspecified inflammatory spondylopathy, lumbar region: Secondary | ICD-10-CM | POA: Diagnosis not present

## 2022-03-28 DIAGNOSIS — G8929 Other chronic pain: Secondary | ICD-10-CM | POA: Diagnosis present

## 2022-03-28 DIAGNOSIS — R2689 Other abnormalities of gait and mobility: Secondary | ICD-10-CM | POA: Diagnosis not present

## 2022-03-28 DIAGNOSIS — D649 Anemia, unspecified: Secondary | ICD-10-CM | POA: Diagnosis not present

## 2022-03-28 DIAGNOSIS — R6520 Severe sepsis without septic shock: Secondary | ICD-10-CM | POA: Diagnosis present

## 2022-03-28 DIAGNOSIS — D696 Thrombocytopenia, unspecified: Secondary | ICD-10-CM | POA: Diagnosis present

## 2022-03-28 DIAGNOSIS — K5090 Crohn's disease, unspecified, without complications: Secondary | ICD-10-CM | POA: Insufficient documentation

## 2022-03-28 DIAGNOSIS — R109 Unspecified abdominal pain: Secondary | ICD-10-CM | POA: Diagnosis not present

## 2022-03-28 DIAGNOSIS — E785 Hyperlipidemia, unspecified: Secondary | ICD-10-CM | POA: Diagnosis present

## 2022-03-28 DIAGNOSIS — R569 Unspecified convulsions: Secondary | ICD-10-CM | POA: Diagnosis not present

## 2022-03-28 DIAGNOSIS — N135 Crossing vessel and stricture of ureter without hydronephrosis: Secondary | ICD-10-CM

## 2022-03-28 DIAGNOSIS — G4730 Sleep apnea, unspecified: Secondary | ICD-10-CM | POA: Diagnosis not present

## 2022-03-28 DIAGNOSIS — Z885 Allergy status to narcotic agent status: Secondary | ICD-10-CM | POA: Diagnosis not present

## 2022-03-28 DIAGNOSIS — R531 Weakness: Secondary | ICD-10-CM | POA: Diagnosis not present

## 2022-03-28 DIAGNOSIS — F32A Depression, unspecified: Secondary | ICD-10-CM | POA: Diagnosis present

## 2022-03-28 DIAGNOSIS — R0902 Hypoxemia: Secondary | ICD-10-CM | POA: Diagnosis not present

## 2022-03-28 DIAGNOSIS — R000 Tachycardia, unspecified: Secondary | ICD-10-CM | POA: Diagnosis not present

## 2022-03-28 DIAGNOSIS — R64 Cachexia: Secondary | ICD-10-CM | POA: Diagnosis present

## 2022-03-28 DIAGNOSIS — N390 Urinary tract infection, site not specified: Secondary | ICD-10-CM | POA: Diagnosis not present

## 2022-03-28 DIAGNOSIS — R41841 Cognitive communication deficit: Secondary | ICD-10-CM | POA: Diagnosis not present

## 2022-03-28 DIAGNOSIS — Z741 Need for assistance with personal care: Secondary | ICD-10-CM | POA: Diagnosis not present

## 2022-03-28 DIAGNOSIS — Z9181 History of falling: Secondary | ICD-10-CM | POA: Diagnosis not present

## 2022-03-28 DIAGNOSIS — Z79899 Other long term (current) drug therapy: Secondary | ICD-10-CM | POA: Diagnosis not present

## 2022-03-28 DIAGNOSIS — N132 Hydronephrosis with renal and ureteral calculous obstruction: Secondary | ICD-10-CM

## 2022-03-28 DIAGNOSIS — R1310 Dysphagia, unspecified: Secondary | ICD-10-CM | POA: Diagnosis not present

## 2022-03-28 DIAGNOSIS — C44329 Squamous cell carcinoma of skin of other parts of face: Secondary | ICD-10-CM | POA: Diagnosis present

## 2022-03-28 DIAGNOSIS — R410 Disorientation, unspecified: Secondary | ICD-10-CM | POA: Diagnosis not present

## 2022-03-28 DIAGNOSIS — Z6820 Body mass index (BMI) 20.0-20.9, adult: Secondary | ICD-10-CM

## 2022-03-28 HISTORY — PX: CYSTOSCOPY W/ URETERAL STENT PLACEMENT: SHX1429

## 2022-03-28 LAB — URINALYSIS, ROUTINE W REFLEX MICROSCOPIC
Bacteria, UA: NONE SEEN
Bilirubin Urine: NEGATIVE
Glucose, UA: NEGATIVE mg/dL
Ketones, ur: NEGATIVE mg/dL
Leukocytes,Ua: NEGATIVE
Nitrite: NEGATIVE
Protein, ur: 30 mg/dL — AB
RBC / HPF: >50 RBC/hpf — ABNORMAL HIGH (ref 0–5)
Specific Gravity, Urine: 1.013 (ref 1.005–1.030)
pH: 7 (ref 5.0–8.0)

## 2022-03-28 LAB — CBC WITH DIFFERENTIAL/PLATELET
Abs Immature Granulocytes: 0.07 10*3/uL (ref 0.00–0.07)
Basophils Absolute: 0 10*3/uL (ref 0.0–0.1)
Basophils Relative: 0 %
Eosinophils Absolute: 0.1 10*3/uL (ref 0.0–0.5)
Eosinophils Relative: 1 %
HCT: 38.5 % — ABNORMAL LOW (ref 39.0–52.0)
Hemoglobin: 12.4 g/dL — ABNORMAL LOW (ref 13.0–17.0)
Immature Granulocytes: 1 %
Lymphocytes Relative: 1 %
Lymphs Abs: 0.2 10*3/uL — ABNORMAL LOW (ref 0.7–4.0)
MCH: 29.8 pg (ref 26.0–34.0)
MCHC: 32.2 g/dL (ref 30.0–36.0)
MCV: 92.5 fL (ref 80.0–100.0)
Monocytes Absolute: 0.5 10*3/uL (ref 0.1–1.0)
Monocytes Relative: 3 %
Neutro Abs: 13.2 10*3/uL — ABNORMAL HIGH (ref 1.7–7.7)
Neutrophils Relative %: 94 %
Platelets: 159 10*3/uL (ref 150–400)
RBC: 4.16 MIL/uL — ABNORMAL LOW (ref 4.22–5.81)
RDW: 15.6 % — ABNORMAL HIGH (ref 11.5–15.5)
WBC: 14.1 10*3/uL — ABNORMAL HIGH (ref 4.0–10.5)
nRBC: 0 % (ref 0.0–0.2)

## 2022-03-28 LAB — COMPREHENSIVE METABOLIC PANEL
ALT: 24 U/L (ref 0–44)
AST: 35 U/L (ref 15–41)
Albumin: 3.6 g/dL (ref 3.5–5.0)
Alkaline Phosphatase: 373 U/L — ABNORMAL HIGH (ref 38–126)
Anion gap: 6 (ref 5–15)
BUN: 11 mg/dL (ref 6–20)
CO2: 25 mmol/L (ref 22–32)
Calcium: 9.3 mg/dL (ref 8.9–10.3)
Chloride: 111 mmol/L (ref 98–111)
Creatinine, Ser: 0.79 mg/dL (ref 0.61–1.24)
GFR, Estimated: >60 mL/min (ref >60)
Glucose, Bld: 85 mg/dL (ref 70–99)
Potassium: 3.9 mmol/L (ref 3.5–5.1)
Sodium: 142 mmol/L (ref 135–145)
Total Bilirubin: 1.6 mg/dL — ABNORMAL HIGH (ref 0.3–1.2)
Total Protein: 6.6 g/dL (ref 6.5–8.1)

## 2022-03-28 LAB — RESP PANEL BY RT-PCR (FLU A&B, COVID) ARPGX2
Influenza A by PCR: NEGATIVE
Influenza B by PCR: NEGATIVE
SARS Coronavirus 2 by RT PCR: NEGATIVE

## 2022-03-28 LAB — PROTIME-INR
INR: 1.2 (ref 0.8–1.2)
Prothrombin Time: 15.3 seconds — ABNORMAL HIGH (ref 11.4–15.2)

## 2022-03-28 LAB — AMMONIA: Ammonia: 40 umol/L — ABNORMAL HIGH (ref 9–35)

## 2022-03-28 LAB — LACTIC ACID, PLASMA
Lactic Acid, Venous: 2.6 mmol/L (ref 0.5–1.9)
Lactic Acid, Venous: 2.1 mmol/L (ref 0.5–1.9)

## 2022-03-28 LAB — GLUCOSE, CAPILLARY: Glucose-Capillary: 88 mg/dL (ref 70–99)

## 2022-03-28 SURGERY — CYSTOSCOPY, WITH RETROGRADE PYELOGRAM AND URETERAL STENT INSERTION
Anesthesia: General | Laterality: Left

## 2022-03-28 MED ORDER — PROPOFOL 500 MG/50ML IV EMUL
INTRAVENOUS | Status: DC | PRN
  Administered 2022-03-28: 100 mg via INTRAVENOUS
  Administered 2022-03-28: 50 mg via INTRAVENOUS

## 2022-03-28 MED ORDER — SODIUM CHLORIDE 0.9 % IV SOLN
1.0000 g | INTRAVENOUS | Status: DC
  Filled 2022-03-28: qty 10

## 2022-03-28 MED ORDER — FENTANYL CITRATE (PF) 100 MCG/2ML IJ SOLN
INTRAMUSCULAR | Status: DC | PRN
  Administered 2022-03-28 (×2): 50 ug via INTRAVENOUS

## 2022-03-28 MED ORDER — PROPOFOL 10 MG/ML IV BOLUS
INTRAVENOUS | Status: AC
  Filled 2022-03-28: qty 20

## 2022-03-28 MED ORDER — ONDANSETRON HCL 4 MG/2ML IJ SOLN
INTRAMUSCULAR | Status: DC | PRN
  Administered 2022-03-28: 4 mg via INTRAVENOUS

## 2022-03-28 MED ORDER — MIDAZOLAM HCL 2 MG/2ML IJ SOLN
INTRAMUSCULAR | Status: AC
  Filled 2022-03-28: qty 2

## 2022-03-28 MED ORDER — OXYCODONE HCL 5 MG PO TABS: 5.0000 mg | ORAL_TABLET | Freq: Once | ORAL | Status: DC | PRN

## 2022-03-28 MED ORDER — PHENYLEPHRINE 80 MCG/ML (10ML) SYRINGE FOR IV PUSH (FOR BLOOD PRESSURE SUPPORT)
PREFILLED_SYRINGE | INTRAVENOUS | Status: DC | PRN
  Administered 2022-03-28 (×2): 80 ug via INTRAVENOUS
  Administered 2022-03-28: 160 ug via INTRAVENOUS

## 2022-03-28 MED ORDER — FENTANYL CITRATE (PF) 100 MCG/2ML IJ SOLN
INTRAMUSCULAR | Status: AC
  Filled 2022-03-28: qty 2

## 2022-03-28 MED ORDER — ONDANSETRON HCL 4 MG/2ML IJ SOLN
INTRAMUSCULAR | Status: AC
  Filled 2022-03-28: qty 2

## 2022-03-28 MED ORDER — DEXAMETHASONE SODIUM PHOSPHATE 10 MG/ML IJ SOLN
INTRAMUSCULAR | Status: AC
  Filled 2022-03-28: qty 1

## 2022-03-28 MED ORDER — SODIUM CHLORIDE 0.9 % IV SOLN: 100.0000 mg | Freq: Two times a day (BID) | INTRAVENOUS | Status: DC

## 2022-03-28 MED ORDER — DEXAMETHASONE SODIUM PHOSPHATE 10 MG/ML IJ SOLN
INTRAMUSCULAR | Status: DC | PRN
  Administered 2022-03-28: 5 mg via INTRAVENOUS

## 2022-03-28 MED ORDER — SODIUM CHLORIDE 0.9 % IV BOLUS
1000.0000 mL | Freq: Once | INTRAVENOUS | Status: AC
  Administered 2022-03-28: 1000 mL via INTRAVENOUS

## 2022-03-28 MED ORDER — LIDOCAINE 2% (20 MG/ML) 5 ML SYRINGE
INTRAMUSCULAR | Status: DC | PRN
  Administered 2022-03-28: 100 mg via INTRAVENOUS

## 2022-03-28 MED ORDER — ACETAMINOPHEN 650 MG RE SUPP
650.0000 mg | Freq: Once | RECTAL | Status: AC
  Administered 2022-03-28: 650 mg via RECTAL
  Filled 2022-03-28: qty 1

## 2022-03-28 MED ORDER — SODIUM CHLORIDE 0.9 % IV SOLN
2.0000 g | Freq: Once | INTRAVENOUS | Status: AC
  Administered 2022-03-28: 2 g via INTRAVENOUS
  Filled 2022-03-28: qty 12.5

## 2022-03-28 MED ORDER — FENTANYL CITRATE PF 50 MCG/ML IJ SOSY: 25.0000 ug | PREFILLED_SYRINGE | INTRAMUSCULAR | Status: DC | PRN

## 2022-03-28 MED ORDER — OXYCODONE HCL 5 MG/5ML PO SOLN: 5.0000 mg | Freq: Once | ORAL | Status: DC | PRN

## 2022-03-28 MED ORDER — VANCOMYCIN HCL 1250 MG/250ML IV SOLN
1250.0000 mg | Freq: Once | INTRAVENOUS | Status: AC
  Administered 2022-03-28: 1250 mg via INTRAVENOUS
  Filled 2022-03-28: qty 250

## 2022-03-28 MED ORDER — LACTATED RINGERS IV SOLN: INTRAVENOUS | Status: DC | PRN

## 2022-03-28 MED ORDER — ONDANSETRON HCL 4 MG/2ML IJ SOLN: 4.0000 mg | Freq: Once | INTRAMUSCULAR | Status: DC | PRN

## 2022-03-28 MED ORDER — MIDAZOLAM HCL 2 MG/2ML IJ SOLN
INTRAMUSCULAR | Status: DC | PRN
  Administered 2022-03-28 (×2): 1 mg via INTRAVENOUS

## 2022-03-28 SURGICAL SUPPLY — 15 items
BAG URO CATCHER STRL LF (MISCELLANEOUS) ×2 IMPLANT
BASKET ZERO TIP NITINOL 2.4FR (BASKET) IMPLANT
BSKT STON RTRVL ZERO TP 2.4FR (BASKET)
CATH URETL OPEN END 6FR 70 (CATHETERS) IMPLANT
CLOTH BEACON ORANGE TIMEOUT ST (SAFETY) ×2 IMPLANT
GLOVE SURG LX STRL 7.5 STRW (GLOVE) ×2 IMPLANT
GOWN STRL REUS W/ TWL XL LVL3 (GOWN DISPOSABLE) ×2 IMPLANT
GOWN STRL REUS W/TWL XL LVL3 (GOWN DISPOSABLE) ×1
GUIDEWIRE ANG ZIPWIRE 038X150 (WIRE) IMPLANT
GUIDEWIRE STR DUAL SENSOR (WIRE) ×2 IMPLANT
KIT TURNOVER KIT A (KITS) IMPLANT
MANIFOLD NEPTUNE II (INSTRUMENTS) ×2 IMPLANT
PACK CYSTO (CUSTOM PROCEDURE TRAY) ×2 IMPLANT
STENT POLARIS LOOP 6FR X 26 CM (STENTS) IMPLANT
TUBING CONNECTING 10 (TUBING) ×2 IMPLANT

## 2022-03-28 NOTE — ED Notes (Signed)
ED Provider at bedside. 

## 2022-03-28 NOTE — Anesthesia Procedure Notes (Signed)
Procedure Name: LMA Insertion Date/Time: 03/28/2022 10:52 PM  Performed by: Gerald Leitz, CRNAPre-anesthesia Checklist: Patient identified, Patient being monitored, Timeout performed, Emergency Drugs available and Suction available Patient Re-evaluated:Patient Re-evaluated prior to induction Oxygen Delivery Method: Circle system utilized Preoxygenation: Pre-oxygenation with 100% oxygen Induction Type: IV induction Ventilation: Mask ventilation without difficulty LMA: LMA inserted LMA Size: 4.0 Tube type: Oral Number of attempts: 1 Placement Confirmation: positive ETCO2 and breath sounds checked- equal and bilateral Tube secured with: Tape Dental Injury: Teeth and Oropharynx as per pre-operative assessment

## 2022-03-28 NOTE — ED Triage Notes (Signed)
Pt BIB EMS. Pt coming from home, wife reports fever, wife reports some confusion- all starting today. Pt has hx of frequent UTI's and kidney stones. EMS reports pt is alert. Capnography 20 HR 120 Pt seen in this ED 9/24 for same HX of liver cancer  CBG 89

## 2022-03-28 NOTE — Anesthesia Preprocedure Evaluation (Addendum)
Anesthesia Evaluation  Patient identified by MRN, date of birth, ID band Patient awake    Reviewed: Allergy & Precautions, NPO status , Patient's Chart, lab work & pertinent test results  Airway Mallampati: II  TM Distance: >3 FB Neck ROM: Full    Dental  (+) Poor Dentition, Dental Advisory Given   Pulmonary sleep apnea , pneumonia, unresolved,    Pulmonary exam normal breath sounds clear to auscultation       Cardiovascular hypertension, Pt. on medications Normal cardiovascular exam Rhythm:Regular Rate:Normal     Neuro/Psych negative neurological ROS  negative psych ROS   GI/Hepatic GERD  Medicated,(+) Cirrhosis       , Hepatitis -  Endo/Other  diabetes  Renal/GU negative Renal ROS  negative genitourinary   Musculoskeletal negative musculoskeletal ROS (+)   Abdominal   Peds negative pediatric ROS (+)  Hematology  (+) Blood dyscrasia, anemia ,   Anesthesia Other Findings   Reproductive/Obstetrics negative OB ROS                             Anesthesia Physical Anesthesia Plan  ASA: 3 and emergent  Anesthesia Plan: General   Post-op Pain Management: Minimal or no pain anticipated   Induction: Intravenous  PONV Risk Score and Plan: 2 and Ondansetron, Dexamethasone and Treatment may vary due to age or medical condition  Airway Management Planned: LMA  Additional Equipment:   Intra-op Plan:   Post-operative Plan: Extubation in OR  Informed Consent: I have reviewed the patients History and Physical, chart, labs and discussed the procedure including the risks, benefits and alternatives for the proposed anesthesia with the patient or authorized representative who has indicated his/her understanding and acceptance.     Dental advisory given  Plan Discussed with: CRNA and Surgeon  Anesthesia Plan Comments:         Anesthesia Quick Evaluation

## 2022-03-28 NOTE — ED Provider Notes (Signed)
[facility] DEPT Provider Note   CSN: [id number] Arrival date & time: 03/28/22  1924     History  Chief Complaint  Patient presents with   Fever    BRYDON SPAHR is a 61 y.o. male.  61 year old male with prior medical history as detailed below presents for evaluation.  Patient with fever this afternoon and confusion related to same.  Patient with recent admission requiring antibiotics for treatment of suspected UTI.  Patient completed course of outpatient antibiotics yesterday.  Patient with history of metastatic liver cancer.  Patient is accompanied by his wife.  Patient is mildly confused but is able to answer questions.  He is unable to recall specific details of recent illness.    The history is provided by the patient and medical records.         Home Medications Prior to Admission medications   Medication Sig Start Date End Date Taking? Authorizing Provider  acetaminophen (TYLENOL) 500 MG tablet Take 2 tablets (1,000 mg total) by mouth 3 (three) times daily. Patient taking differently: Take 1,000 mg by mouth at bedtime. 12/21/21   Geradine Girt, DO  acitretin ([name]) 25 MG capsule Take 25 mg by mouth daily. 11/18/21   [provider]  CALCIUM PO Take 1 tablet by mouth 2 (two) times daily.    [provider]  cemiplimab-rwlc (LIBTAYO) 350 MG/7ML SOLN injection Inject into the vein See admin instructions. Every 4-6 weeks    [provider]  chlorhexidine (PERIDEX) 0.12 % solution Use as directed 15 mLs in the mouth or throat 2 (two) times daily.    [provider]  Cholecalciferol (VITAMIN D-3 PO) Take 1 capsule by mouth daily.    [provider]  cycloSPORINE (RESTASIS) 0.05 % ophthalmic emulsion Place 1 drop into both eyes 2 (two) times daily as needed (dry eyes). 05/04/21   [provider]  desonide (DESOWEN) 0.05 % ointment Apply 1 Application topically See admin instructions.  Apply to affected area of lower lip 3 times a day as needed for irritation- after mixing with Mupirocin 2% ointment    [provider]  esomeprazole (NEXIUM) 40 MG capsule Take 1 capsule (40 mg total) by mouth 2 (two) times daily before a meal. 06/04/21   Ladene Artist, MD  Eszopiclone 3 MG TABS Take 3 mg by mouth at bedtime.    [provider]  feeding supplement (ENSURE ENLIVE / ENSURE PLUS) LIQD Take 237 mLs by mouth 2 (two) times daily between meals. 12/11/20   Cristal Deer, MD  folic acid (FOLVITE) 1 MG tablet Take 2 tablets (2 mg total) by mouth daily. 01/11/20   Daleen Squibb, MD  gabapentin (NEURONTIN) 300 MG capsule Take 300 mg by mouth in the morning and at bedtime.    [provider]  hydrocortisone 2.5 % cream Apply 1 Application topically 2 (two) times daily as needed (Itchy skin).    [provider]  Lactase (DAIRY RELIEF PO) Take 1 tablet by mouth in the morning and at bedtime.    [provider]  lamoTRIgine (LAMICTAL) 150 MG tablet Take 1 tablet (150 mg total) by mouth 2 (two) times daily. 10/15/21   Lomax, Amy, NP  latanoprost (XALATAN) 0.005 % ophthalmic solution Place 1 drop into both eyes at bedtime.     [provider]  mesalamine (LIALDA) 1.2 g EC tablet Take 2 tablets (2.4 g total) by mouth 2 (two) times daily. Patient  taking differently: Take 2.4 g by mouth daily. 12/17/21 03/17/22  Ladene Artist, MD  methscopolamine (PAMINE FORTE) 5 MG tablet Take 1 tablet (5 mg total) by mouth 2 (two) times daily. 12/17/21 03/17/22  Ladene Artist, MD  mupirocin ointment (BACTROBAN) 2 % Apply 1 Application topically See admin instructions. Apply to affected area of lower lip three times a day as needed for irritation- after mixing with Desonide 0.05% ointment    [provider]  MYRBETRIQ 50 MG TB24 tablet Take 50 mg by mouth at bedtime.    [provider]  oxyCODONE (OXY IR/ROXICODONE) 5 MG immediate  release tablet Take 1 tablet (5 mg total) by mouth daily as needed for moderate pain or severe pain. 03/21/22   Eugenie Filler, MD  potassium chloride SA (KLOR-CON) 20 MEQ tablet Take 1 tablet (20 mEq total) by mouth 2 (two) times daily. 01/11/20   Daleen Squibb, MD  Probiotic Product (PROBIOTIC PO) Take 1 tablet at bedtime by mouth.     [provider]  rifaximin (XIFAXAN) 550 MG TABS tablet Take 1 tablet (550 mg total) by mouth 2 (two) times daily. 06/04/21   Ladene Artist, MD  sertraline (ZOLOFT) 100 MG tablet Take 200 mg daily with breakfast by mouth.  03/16/17   [provider]  tamsulosin (FLOMAX) 0.4 MG CAPS capsule Take 0.4 mg by mouth at bedtime. 12/07/21   [provider]  tolterodine (DETROL LA) 4 MG 24 hr capsule Take 4 mg by mouth every evening. 01/25/21   [provider]  traZODone (DESYREL) 150 MG tablet Take 150 mg by mouth at bedtime.    [provider]      Allergies    Sulfonamide derivatives, Z-pak [azithromycin], Dilaudid [hydromorphone hcl], and Sulfa antibiotics    Review of Systems   Review of Systems  All other systems reviewed and are negative.   Physical Exam Updated Vital Signs BP 133/69   Pulse (!) 101   Temp (!) 100.5 F (38.1 C) (Oral)   Resp 20   SpO2 98%  Physical Exam Vitals and nursing note reviewed.  Constitutional:      General: He is not in acute distress.    Appearance: Normal appearance. He is well-developed.  HENT:     Head: Normocephalic and atraumatic.     Mouth/Throat:     Mouth: Mucous membranes are dry.  Eyes:     Conjunctiva/sclera: Conjunctivae normal.     Pupils: Pupils are equal, round, and reactive to light.  Cardiovascular:     Rate and Rhythm: Normal rate and regular rhythm.     Heart sounds: Normal heart sounds.  Pulmonary:     Effort: Pulmonary effort is normal. No respiratory distress.     Breath sounds: Normal breath sounds.  Abdominal:     General: There is no  distension.     Palpations: Abdomen is soft.     Tenderness: There is no abdominal tenderness.  Musculoskeletal:        General: No deformity. Normal range of motion.     Cervical back: Normal range of motion and neck supple.  Skin:    General: Skin is warm and dry.  Neurological:     General: No focal deficit present.     Mental Status: He is alert.     Comments: Alert, oriented to person and place, mildly confused, nonfocal     ED Results / Procedures / Treatments   Labs (all labs  ordered are listed, but only abnormal results are displayed) Labs Reviewed  COMPREHENSIVE METABOLIC PANEL - Abnormal; Notable for the following components:      Result Value   Alkaline Phosphatase 373 (*)    Total Bilirubin 1.6 (*)    All other components within normal limits  LACTIC ACID, PLASMA - Abnormal; Notable for the following components:   Lactic Acid, Venous 2.6 (*)    All other components within normal limits  CBC WITH DIFFERENTIAL/PLATELET - Abnormal; Notable for the following components:   WBC 14.1 (*)    RBC 4.16 (*)    Hemoglobin 12.4 (*)    HCT 38.5 (*)    RDW 15.6 (*)    Neutro Abs 13.2 (*)    Lymphs Abs 0.2 (*)    All other components within normal limits  PROTIME-INR - Abnormal; Notable for the following components:   Prothrombin Time 15.3 (*)    All other components within normal limits  URINALYSIS, ROUTINE W REFLEX MICROSCOPIC - Abnormal; Notable for the following components:   Hgb urine dipstick LARGE (*)    Protein, ur 30 (*)    RBC / HPF >50 (*)    All other components within normal limits  AMMONIA - Abnormal; Notable for the following components:   Ammonia 40 (*)    All other components within normal limits  RESP PANEL BY RT-PCR (FLU A&B, COVID) ARPGX2  CULTURE, BLOOD (ROUTINE X 2)  CULTURE, BLOOD (ROUTINE X 2)  LACTIC ACID, PLASMA    EKG EKG Interpretation  Date/Time:  Thursday March 28 2022 19:44:59 EDT Ventricular Rate:  107 PR Interval:  196 QRS  Duration: 95 QT Interval:  330 QTC Calculation: 441 R Axis:   -1 Text Interpretation: Sinus tachycardia Consider right atrial enlargement Probable anteroseptal infarct, old Confirmed by Dene Gentry [phone]) on 03/28/2022 7:49:36 PM  Radiology DG Chest 2 View  Result Date: 03/28/2022 CLINICAL DATA:  Sepsis EXAM: CHEST - 2 VIEW COMPARISON:  03/17/2022 FINDINGS: Frontal and lateral views of the chest demonstrate mild enlargement the cardiac silhouette. There is central vascular congestion. Left lower lobe airspace disease concerning for pneumonia. No effusion or pneumothorax. No acute bony abnormality. IMPRESSION: 1. Left lower lobe airspace disease compatible with pneumonia. Aspiration could be considered in the appropriate clinical setting. 2. Mild enlarged cardiac silhouette and central vascular congestion. Electronically Signed   By: Randa Ngo M.D.   On: 03/28/2022 20:57   CT ABDOMEN PELVIS WO CONTRAST  Result Date: 03/28/2022 CLINICAL DATA:  Abdominal pain, acute, nonlocalized. Fever, altered mental status. EXAM: CT ABDOMEN AND PELVIS WITHOUT CONTRAST TECHNIQUE: Multidetector CT imaging of the abdomen and pelvis was performed following the standard protocol without IV contrast. RADIATION DOSE REDUCTION: This exam was performed according to the departmental dose-optimization program which includes automated exposure control, adjustment of the mA and/or kV according to patient size and/or use of iterative reconstruction technique. COMPARISON:  03/17/2022 FINDINGS: Lower chest: Focal consolidation has developed within the posterior basal left lower lobe, new since prior examination, possibly infectious in the acute setting. No pleural effusion. At least mild coronary artery calcification. Global cardiac size within normal limits. Hepatobiliary: Previously noted heterogeneously enhancing mass within the right hepatic dome is not as well visualized on this noncontrast examination and is poorly  delineated. Cirrhotic morphology of the liver again noted no intra or extrahepatic biliary ductal dilation. Gallbladder unremarkable. Pancreas: Unremarkable Spleen: Moderate splenomegaly is unchanged with the spleen measuring 18.1 cm in greatest dimension. Adrenals/Urinary Tract: The adrenal  glands are unremarkable. The kidneys are normal in size and position. Previously noted tiny obstructing calculus at the right ureterovesicular junction is no longer visualized, presumably having passed in the interval, and right-sided hydronephrosis has resolved. No residual right sided renal or ureteral calculi are identified. In the interval since prior examination, multiple calculi have migrated from the left pelvic calyceal system into the left ureteropelvic junction with an obstructing 7 x 12 mm calculus now resulting in moderate left hydronephrosis. Numerous additional nonobstructing left renal pelvic and calyceal calculi are identified measuring up to 8 mm. No perinephric fluid collections are seen. The bladder is unremarkable. Stomach/Bowel: Moderate colonic stool burden without evidence of obstruction. Mild sigmoid diverticulosis. The stomach, small bowel, and large bowel are otherwise unremarkable. Appendix absent. No free intraperitoneal gas or fluid. Vascular/Lymphatic: Aortic atherosclerosis. No enlarged abdominal or pelvic lymph nodes. Reproductive: Prostate is unremarkable. Other: Umbilical hernia repair has been performed. No recurrent abdominal wall hernia. Musculoskeletal: Left hip ORIF has been performed. Osseous structures are age-appropriate. No acute bone abnormality. Multiple healing left rib fractures again noted. IMPRESSION: 1. Interval development of a 7 x 12 mm obstructing calculus at the left ureteropelvic junction resulting in moderate left hydronephrosis. 2. Interval resolution of right-sided hydronephrosis and passage of a tiny obstructing calculus at the right ureterovesicular junction. 3.  Interval development of focal consolidation within the posterior basal left lower lobe, possibly infectious in nature. 4. Cirrhotic morphology of the liver. Previously noted heterogeneously enhancing mass within the right hepatic dome is not as well visualized on this noncontrast examination. 5. Moderate colonic stool burden without evidence of obstruction. Aortic Atherosclerosis (ICD10-I70.0). Electronically Signed   By: Fidela Salisbury M.D.   On: 03/28/2022 20:52   CT Head Wo Contrast  Result Date: 03/28/2022 CLINICAL DATA:  Delirium EXAM: CT HEAD WITHOUT CONTRAST TECHNIQUE: Contiguous axial images were obtained from the base of the skull through the vertex without intravenous contrast. RADIATION DOSE REDUCTION: This exam was performed according to the departmental dose-optimization program which includes automated exposure control, adjustment of the mA and/or kV according to patient size and/or use of iterative reconstruction technique. COMPARISON:  Brain CT March 17, 2022 FINDINGS: Brain: Redemonstrated frontal volume loss. No evidence for acute cortically based infarct, intracranial hemorrhage, mass lesion or mass-effect. Vascular: No hyperdense vessel or unexpected calcification. Skull: Intact. Sinuses/Orbits: No acute finding. Other: None. IMPRESSION: No acute intracranial process.  Volume loss. Electronically Signed   By: Lovey Newcomer M.D.   On: 03/28/2022 20:43    Procedures Procedures    Medications Ordered in ED Medications  vancomycin (VANCOREADY) IVPB 1250 mg/250 mL (1,250 mg Intravenous New Bag/Given 03/28/22 2051)  sodium chloride 0.9 % bolus 1,000 mL (1,000 mLs Intravenous New Bag/Given 03/28/22 2019)  ceFEPIme (MAXIPIME) 2 g in sodium chloride 0.9 % 100 mL IVPB (0 g Intravenous Stopped 03/28/22 2050)  acetaminophen (TYLENOL) suppository 650 mg (650 mg Rectal Given 03/28/22 2051)    ED Course/ Medical Decision Making/ A&P                           Medical Decision Making Amount  and/or Complexity of Data Reviewed Labs: ordered. Radiology: ordered.  Risk OTC drugs. Prescription drug management. Decision regarding hospitalization.    Medical Screen Complete  This patient presented to the ED with complaint of fever, confusion.  This complaint involves an extensive number of treatment options. The initial differential diagnosis includes, but is not limited to, bacterial versus  viral infection, metabolic abnormality, etc.  This presentation is: Acute, Chronic, Self-Limited, Previously Undiagnosed, Uncertain Prognosis, Complicated, Systemic Symptoms, and Threat to Life/Bodily Function  Patient with fever beginning this afternoon and related delirium.  Work-up is suggestive of likely pneumonia.  Additionally, patient found to have ureteral stone.  UA is not suggestive of active infection.  However, given size of stone Dr. Junious Silk with urology is aware of case.  Dr. Junious Silk will consult.  Broad-spectrum antibiotics administered the patient on arrival out of concern for possible sepsis.  Hospitalist service is aware of case and will evaluate for admission.  Additional history obtained:  Additional history obtained from Spouse External records from outside sources obtained and reviewed including prior ED visits and prior Inpatient records.    Lab Tests:  I ordered and personally interpreted labs.  The pertinent results include: CBC, CMP, INR, ammonia, lactic acid, COVID, flu, UA   Imaging Studies ordered:  I ordered imaging studies including CT head, CT abdomen pelvis, chest x-ray I independently visualized and interpreted obtained imaging which showed possible pulmonary infiltrate I agree with the radiologist interpretation.   Cardiac Monitoring:  The patient was maintained on a cardiac monitor.  I personally viewed and interpreted the cardiac monitor which showed an underlying rhythm of: Sinus tach   Medicines ordered:  I ordered medication  including acetaminophen, cefepime, vancomycin for suspected infection Reevaluation of the patient after these medicines showed that the patient: improved   Problem List / ED Course:  Fever, suspected pneumonia, ureteral stone   Reevaluation:  After the interventions noted above, I reevaluated the patient and found that they have: improved   Disposition:  After consideration of the diagnostic results and the patients response to treatment, I feel that the patent would benefit from admission.  CRITICAL CARE Performed by: Valarie Merino   Total critical care time: 30 minutes  Critical care time was exclusive of separately billable procedures and treating other patients.  Critical care was necessary to treat or prevent imminent or life-threatening deterioration.  Critical care was time spent personally by me on the following activities: development of treatment plan with patient and/or surrogate as well as nursing, discussions with consultants, evaluation of patient's response to treatment, examination of patient, obtaining history from patient or surrogate, ordering and performing treatments and interventions, ordering and review of laboratory studies, ordering and review of radiographic studies, pulse oximetry and re-evaluation of patient's condition.          Final Clinical Impression(s) / ED Diagnoses Final diagnoses:  Fever, unspecified fever cause  Ureteral stone with hydronephrosis  Pneumonia due to infectious organism, unspecified laterality, unspecified part of lung    Rx / DC Orders ED Discharge Orders     None         Valarie Merino, MD 03/28/22 2155

## 2022-03-28 NOTE — ED Notes (Signed)
Patient transported to CT 

## 2022-03-28 NOTE — Transfer of Care (Signed)
Immediate Anesthesia Transfer of Care Note  Patient: [name] [name]  Procedure(s) Performed: Procedure(s): CYSTOSCOPY WITH RETROGRADE URETERAL STENT PLACEMENT (Left)  Patient Location: PACU  Anesthesia Type:General  Level of Consciousness: Alert, Awake, Oriented  Airway & Oxygen Therapy: Patient Spontanous Breathing  Post-op Assessment: Report given to RN  Post vital signs: Reviewed and stable  Last Vitals:  Vitals:   03/28/22 2045 03/28/22 2115  BP: (!) 142/81 133/69  Pulse: 69 (!) 101  Resp: (!) 23 20  Temp:    SpO2: 04% 88%    Complications: No apparent anesthesia complications

## 2022-03-28 NOTE — H&P (Signed)
History and Physical    Patient: [name] [name] DTO:[id number] DOB: April 04, 1961 DOA: 03/28/2022 DOS: the patient was seen and examined on 03/29/2022 PCP: [name], MD  Patient coming from: Home  Chief Complaint:  Chief Complaint  Patient presents with   Fever   HPI: [name] [name] is a 61 y.o. male with medical history significant of cirrhosis, squamous cell cancer, hepatocellular cancer, complex partial seizures, Crohn's disease, type 2 diabetes, depression who presents with altered mental status and vomiting.  History mostly provided by wife at bedside as patient appears disoriented.  Patient recently admitted 03/17/2022 to 03/21/2022.  He presented with urosepsis with right UPJ nephrolithiasis and hydronephrosis.  Underwent cystoscopy 9/26 with urology and was treated with 10-day course of antibiotics.  He just finished Omn[facility] 2 days ago and had treatment at [facility] for his hepatocellular cancer yesterday.  Today he became significantly more weak, had vomiting, disoriented and developed fever prompting his wife to bring him to the ED. He reports back pain.  Denies any dysuria but has increasing frequency.  No cough, chest pain or shortness of breath.  In the ED, he was febrile up to 100.5 F, tachycardic, tachypneic and normotensive.  WBC of 14, lactate of 2.6.  Hemoglobin of 12.  CMP otherwise unremarkable.  Normal creatinine.  LFTs were negative.  Ammonia level of 40.  CT A/P showed improved right-sided UPJ but now shows new left-sided obstructing 7 x 12 mm calculus with moderate left hydronephrosis.  He was initially started on IV vancomycin and cefepime in the ED.  ED PA discussed with urology Dr. Junious Silk who later evaluated patient at bedside and will take urgently for stenting overnight. Review of Systems: unable to review all systems due to the inability of the patient to answer questions. Past Medical History:  Diagnosis Date   Adenomatous colon polyp 10/1999   Anxiety     Arthritis    neck, yoga helps.   Cataract    Complex partial seizure ([place])    last seizure was 08-30-2018   Crohn's disease of small and large intestines ([place]) 1999   Depression    Diabetes mellitus    no meds at this time 10-24-17   Esophageal stricture    External hemorrhoids    GERD (gastroesophageal reflux disease)    Glaucoma    History of kidney stones    3 large stones still present   Hyperlipemia    Hypertension    resolved with weight loss    Inguinal hernia, bilateral 12/2019   Liver cirrhosis secondary to NASH [facility])    Migraines    Osteoporosis    Pathological fracture of left hip due to osteoporosis ([place]) 12/18/2021   PONV (postoperative nausea and vomiting)    PTSD (post-traumatic stress disorder)    Renal cyst, acquired, left 07/08/2017   Skin cancer    squamous cell multiple; [place]; followed every 3 months.   Sleep apnea    CPAP machine, uses nightly   Staphylococcus aureus bacteremia with sepsis ([place]) 05/28/2017   MRSA 2012   Past Surgical History:  Procedure Laterality Date   CATARACT EXTRACTION Bilateral    COLONOSCOPY     CYSTOSCOPY W/ URETERAL STENT PLACEMENT Right 03/18/2022   Procedure: CYSTOSCOPY WITH RETROGRADE PYELOGRAM, URETEROSCOPY;  Surgeon: Remi Haggard, MD;  Location: [facility];  Service: Urology;  Laterality: Right;   DEBRIDEMENT MANDIBLE N/A 12/07/2020   Procedure: DEBRIDEMENT OF MANDIBLE;  Surgeon: [name], DMD;  Location: [place];  Service:  Oral Surgery;  Laterality: N/A;   ELBOW SURGERY  2012   elbow MRSA infection    EYE SURGERY     cataracts removed, /w IOL   GROSS TEETH DEBRIDEMENT N/A 12/29/2020   Procedure: MANDIBULAR  DEBRIDEMENT;  Surgeon: [name], DMD;  Location: [place];  Service: Dentistry;  Laterality: N/A;   INCISION AND DRAINAGE ABSCESS N/A 12/07/2020   Procedure: INCISION AND DRAINAGE MANDIBULAR ABSCESS;  Surgeon: [name], DMD;  Location: [place];  Service: Oral Surgery;  Laterality: N/A;    INGUINAL HERNIA REPAIR Right 01/14/2020   Procedure: OPEN RIGHT HERNIA REPAIR INGUINAL WITH MESH;  Surgeon: [name], MD;  Location: [facility];  Service: General;  Laterality: Right;   INSERTION OF MESH N/A 07/01/2016   Procedure: INSERTION OF MESH;  Surgeon: [name], MD;  Location: [place];  Service: General;  Laterality: N/A;   INTRAMEDULLARY (IM) NAIL INTERTROCHANTERIC Left 12/17/2021   Procedure: INTRAMEDULLARY (IM) NAIL INTERTROCHANTRIC;  Surgeon: Altamese [place], MD;  Location: [place];  Service: Orthopedics;  Laterality: Left;   IR URETERAL STENT LEFT NEW ACCESS W/O SEP NEPHROSTOMY CATH  02/02/2018   NEPHROLITHOTOMY Left 02/02/2018   Procedure: NEPHROLITHOTOMY PERCUTANEOUS;  Surgeon: [name], MD;  [facility];  Service: Urology;  Laterality: Left;   POLYPECTOMY     SCALP LACERATION REPAIR Right 10/16/2017   From fall/staples   SHOULDER ARTHROSCOPY Right 03/05/2018   Procedure: ARTHROSCOPY SHOULDER AND OPEN DISTAL CLAVICLE EXCISION;  Surgeon: [name], MD;  Location: [place];  Service: Orthopedics;  Laterality: Right;   SHOULDER SURGERY     SINUS SURGERY WITH INSTATRAK     TEE WITHOUT CARDIOVERSION N/A 05/09/2017   Procedure: TRANSESOPHAGEAL ECHOCARDIOGRAM (TEE);  Surgeon: [name], MD;  Location: [facility];  Service: Cardiovascular;  Laterality: N/A;   TOOTH EXTRACTION N/A 12/07/2020   Procedure: DENTAL EXTRACTIONS OF TEETH TWENTY TO THIRTY;  Surgeon: [name], DMD;  Location: [place];  Service: Oral Surgery;  Laterality: N/A;   TOOTH EXTRACTION N/A 12/29/2020   Procedure: DENTAL RESTORATION/EXTRACTIONS;  Surgeon: [name], DMD;  Location: [place];  Service: Dentistry;  Laterality: N/A;   TRANSTHORACIC ECHOCARDIOGRAM  02/22/2011   EF 55-65%; increased pattern of LVH with mild conc hypertrophy, abnormal relaxation & increased filling pressure (grade 2 diastolic dysfunction); atrial septum thickened (lipomatous hypertrophy)   UMBILICAL HERNIA REPAIR  N/A 07/01/2016   Procedure: UMBILICAL HERNIA REPAIR WITH MESH;  Surgeon: [name], MD;  Location: [place];  Service: General;  Laterality: N/A;   UPPER GASTROINTESTINAL ENDOSCOPY     VASECTOMY     Social History:  reports that he has never smoked. He has never been exposed to tobacco smoke. He has never used smokeless tobacco. He reports that he does not drink alcohol and does not use drugs.  Allergies  Allergen Reactions   Sulfonamide Derivatives Hives and Rash   Z-Pak [Azithromycin] Swelling   Dilaudid [Hydromorphone Hcl] Nausea And Vomiting   Sulfa Antibiotics Hives and Rash    Family History  Problem Relation Age of Onset   Heart disease Mother        CABG at age 48   Hyperlipidemia Mother    Hypertension Mother    Diabetes Father    Colon polyps Father    Stroke Father    Hyperlipidemia Father    Hypertension Father    Stroke Paternal Grandmother    Stroke Paternal Grandfather    Other Child  tetrology of fallot (cornealia deland syndrome)   Colon cancer Neg Hx    Esophageal cancer Neg Hx    Stomach cancer Neg Hx    Rectal cancer Neg Hx     Prior to Admission medications   Medication Sig Start Date End Date Taking? Authorizing Provider  acetaminophen (TYLENOL) 500 MG tablet Take 2 tablets (1,000 mg total) by mouth 3 (three) times daily. Patient taking differently: Take 1,000 mg by mouth at bedtime. 12/21/21  Yes Geradine Girt, DO  acitretin ([name]) 25 MG capsule Take 25 mg by mouth daily. 11/18/21   [provider]  CALCIUM PO Take 1 tablet by mouth 2 (two) times daily.    [provider]  cemiplimab-rwlc (LIBTAYO) 350 MG/7ML SOLN injection Inject into the vein See admin instructions. Every 4-6 weeks    [provider]  chlorhexidine (PERIDEX) 0.12 % solution Use as directed 15 mLs in the mouth or throat 2 (two) times daily.    [provider]  Cholecalciferol (VITAMIN D-3 PO) Take 1 capsule by mouth daily.    [provider]  cycloSPORINE (RESTASIS) 0.05 % ophthalmic emulsion Place 1 drop into both eyes 2 (two) times daily as needed (dry eyes). 05/04/21   [provider]  desonide (DESOWEN) 0.05 % ointment Apply 1 Application topically See admin instructions. Apply to affected area of lower lip 3 times a day as needed for irritation- after mixing with Mupirocin 2% ointment    [provider]  esomeprazole (NEXIUM) 40 MG capsule Take 1 capsule (40 mg total) by mouth 2 (two) times daily before a meal. 06/04/21   Ladene Artist, MD  Eszopiclone 3 MG TABS Take 3 mg by mouth at bedtime.    [provider]  feeding supplement (ENSURE ENLIVE / ENSURE PLUS) LIQD Take 237 mLs by mouth 2 (two) times daily between meals. 12/11/20   Cristal Deer, MD  folic acid (FOLVITE) 1 MG tablet Take 2 tablets (2 mg total) by mouth daily. 01/11/20   Daleen Squibb, MD  gabapentin (NEURONTIN) 300 MG capsule Take 300 mg by mouth in the morning and at bedtime.    [provider]  hydrocortisone 2.5 % cream Apply 1 Application topically 2 (two) times daily as needed (Itchy skin).    [provider]  Lactase (DAIRY RELIEF PO) Take 1 tablet by mouth in the morning and at bedtime.    [provider]  lamoTRIgine (LAMICTAL) 150 MG tablet Take 1 tablet (150 mg total) by mouth 2 (two) times daily. 10/15/21   Lomax, Amy, NP  latanoprost (XALATAN) 0.005 % ophthalmic solution Place 1 drop into both eyes at bedtime.     [provider]  mesalamine (LIALDA) 1.2 g EC tablet Take 2 tablets (2.4 g total) by mouth 2 (two) times daily. Patient taking differently: Take 2.4 g by mouth daily. 12/17/21 03/17/22  Ladene Artist, MD  methscopolamine (PAMINE FORTE) 5 MG tablet Take 1 tablet (5 mg total) by mouth 2 (two) times daily. 12/17/21 03/17/22  Ladene Artist, MD  mupirocin ointment (BACTROBAN) 2 % Apply 1 Application topically See admin instructions. Apply to affected area of  lower lip three times a day as needed for irritation- after mixing with Desonide 0.05% ointment    [provider]  MYRBETRIQ 50 MG TB24 tablet Take 50 mg by mouth at bedtime.    [provider]  oxyCODONE (OXY IR/ROXICODONE) 5 MG immediate release tablet Take 1 tablet (  5 mg total) by mouth daily as needed for moderate pain or severe pain. 03/21/22   Eugenie Filler, MD  potassium chloride SA (KLOR-CON) 20 MEQ tablet Take 1 tablet (20 mEq total) by mouth 2 (two) times daily. 01/11/20   Daleen Squibb, MD  Probiotic Product (PROBIOTIC PO) Take 1 tablet at bedtime by mouth.     [provider]  rifaximin (XIFAXAN) 550 MG TABS tablet Take 1 tablet (550 mg total) by mouth 2 (two) times daily. 06/04/21   Ladene Artist, MD  sertraline (ZOLOFT) 100 MG tablet Take 200 mg daily with breakfast by mouth.  03/16/17   [provider]  tamsulosin (FLOMAX) 0.4 MG CAPS capsule Take 0.4 mg by mouth at bedtime. 12/07/21   [provider]  tolterodine (DETROL LA) 4 MG 24 hr capsule Take 4 mg by mouth every evening. 01/25/21   [provider]  traZODone (DESYREL) 150 MG tablet Take 150 mg by mouth at bedtime.    [provider]    Physical Exam: Vitals:   03/29/22 0025 03/29/22 0030 03/29/22 0045 03/29/22 0100  BP: (!) 105/59 (!) 97/57 133/69 124/62  Pulse: 92 92 93 91  Resp: 18 (!) 21 16 13   Temp:    98.9 F (37.2 C)  TempSrc:      SpO2: 95% 94% 95% 96%   Constitutional: NAD, cachectic chronically ill-appearing male laying in bed with restless legs Eyes: lids and conjunctivae normal ENMT: Mucous membranes are moist. Neck: normal, supple, Respiratory: clear to auscultation bilaterally, no wheezing, no crackles. Normal respiratory effort. No accessory muscle use.  Cardiovascular: Regular rate and rhythm, no murmurs / rubs / gallops. No extremity edema.  Abdomen: Soft, nondistended with tenderness and voluntary guarding throughout, bowel  sounds positive.  Musculoskeletal: no clubbing / cyanosis. No joint deformity upper and lower extremities.  Muscle wasting on all extremities.   Skin: no rashes, lesions, ulcers. No induration Neurologic: CN 2-12 grossly intact.  Psychiatric: Able to answer questions and commands. Often times it appeared disoriented and was answering appropriately.   Data Reviewed:  See HPI  Assessment and Plan: * Sepsis [facility]) Patient presented with fever, tachycardia and leukocytosis and lactic acidosis. -Questionable new left lower lobe pneumonia vs new left UPJ obstructing.  Patient presenting only with symptoms of altered mental status and nausea.  Denies any respiratory symptoms although had rib fracture from a fall several weeks ago which does increase risk of pneumonia.  UA is negative for nitrite and leukocyte but might not be accurate in the setting of obstruction. -He received IV vancomycin and cefepime in the ED.  Continue treatment with IV Rocephin and azithromycin. -He was evaluated by urology and will be taken urgently overnight for stenting with ultimate outpatient urology follow-up with [name].  DM2 (diabetes mellitus, type 2) (HCC) Last hemoglobin A1c of 4.6 in June.  Monitor with without any sliding scale for now.  Seizure ([place]) Continue Lamictal  Acute metabolic encephalopathy Secondary to sepsis with underlying malignancy. Continue to monitor with IV antibiotics tx -Holding Tramdol and Lunesta overnight  History of Crohn's disease Continue mesalamine and Xifaxan -He follows with GI at [facility]  Ureteral obstruction, left Previously admitted for right UPJ nephrolithiasis which has now resolved on CT.  However there is a new left-sided obstructing UPJ nephrolithiasis with moderate left hydronephrosis.  Creatinine is stable. -He was taken urgently overnight by urology for stenting  Hepatocellular carcinoma ([place]) Follows at [facility] on cemiplimab with  last dose on 03/27/2012.  Has pending outpatient abdominal MRI to reassess hepatic lesions and staging.       Advance Care Planning:   Code Status: Full Code -confirmed with patient and with wife at bedside  Consults: Urology  Family Communication: Discussed with wife at bedside  Severity of Illness: The appropriate patient status for this patient is INPATIENT. Inpatient status is judged to be reasonable and necessary in order to provide the required intensity of service to ensure the patient's safety. The patient's presenting symptoms, physical exam findings, and initial radiographic and laboratory data in the context of their chronic comorbidities is felt to place them at high risk for further clinical deterioration. Furthermore, it is not anticipated that the patient will be medically stable for discharge from the hospital within 2 midnights of admission.   * I certify that at the point of admission it is my clinical judgment that the patient will require inpatient hospital care spanning beyond 2 midnights from the point of admission due to high intensity of service, high risk for further deterioration and high frequency of surveillance required.*  Author: Orene Desanctis, DO 03/29/2022 1:03 AM  For on call review [email or web address].

## 2022-03-28 NOTE — Op Note (Signed)
Preoperative diagnosis: Left ureteral stones, left hydronephrosis, fever Postoperative diagnosis: Same  Procedure: Cystoscopy with left retrograde pyelogram and left ureteral stent placement  Surgeon: Junious Silk  Anesthesia: General  Indication for procedure: 50 is a 61 year old male had some left flank pain.  CT revealed an 8 x 11, and 8 x 8 mm left proximal stones.  After considering options stent versus no stent they elected to proceed with ureteral stent.  Findings: Left retrograde pyelogram revealed 2 filling defects in the left proximal ureter and moderate proximal hydroureteronephrosis.  Description of procedure: After consent was obtained patient brought to the operating room.  After adequate anesthesia he was placed in lithotomy position and prepped and draped in the usual sterile fashion.  Timeout was performed to confirm the patient and procedure.  The cystoscope was passed per urethra and the bladder inspected.  Left ureteral orifice was cannulated with a 5 [place] open-ended catheter and left retrograde injection of contrast was performed.  A sensor wire was then advanced and coiled in the upper calyx.  A 6 x 26 cm stent was advanced.  There was some hesitation at the proximal stones but with slow ease the pressure of the stent passed into the upper calyx.  The wire was removed with a good coil seen in the kidney and a good coil in the bladder.  The bladder was drained and the scope removed.  He was then awakened and taken to the cover room in stable condition.  Complications: None  Blood loss: Minimal  Specimens: None  Drains: 6 x 26 cm left ureteral stent  Disposition: Patient stable to PACU-I discussed the procedure, postop care and follow-up with his wife [name].

## 2022-03-28 NOTE — Consult Note (Signed)
Consultation: Left hydronephrosis, left ureteral stones Requested by: Dr. Dene Gentry  History of Present Illness: [name] [name] is a 61 year old male with a history of kidney stones.  He has had left flank pain for a few days.  He had some fever today and confusion.  He is brought to emergency.  CT scan abdomen and pelvis revealed 2 large left proximal stones.  There was an 8 x 8 mm stone in the the left UPJ and just distal causing most of the obstruction and 11 x 7 mm stone.  His urine is fairly bland with greater than 50 red cells, no bacteria.  White count 14, creatinine 0.79.  Temp was 100.5.  He also had left lower lobe airspace disease compatible with pneumonia.  He is satting normally on room air.  No shortness of breath.  He is got no dysuria or gross hematuria.    Past Medical History:  Diagnosis Date   Adenomatous colon polyp 10/1999   Anxiety    Arthritis    neck, yoga helps.   Cataract    Complex partial seizure ([place])    last seizure was 08-30-2018   Crohn's disease of small and large intestines ([place]) 1999   Depression    Diabetes mellitus    no meds at this time 10-24-17   Esophageal stricture    External hemorrhoids    GERD (gastroesophageal reflux disease)    Glaucoma    History of kidney stones    3 large stones still present   Hyperlipemia    Hypertension    resolved with weight loss    Inguinal hernia, bilateral 12/2019   Liver cirrhosis secondary to NASH [facility])    Migraines    Osteoporosis    Pathological fracture of left hip due to osteoporosis ([place]) 12/18/2021   PONV (postoperative nausea and vomiting)    PTSD (post-traumatic stress disorder)    Renal cyst, acquired, left 07/08/2017   Skin cancer    squamous cell multiple; [place]; followed every 3 months.   Sleep apnea    CPAP machine, uses nightly   Staphylococcus aureus bacteremia with sepsis ([place]) 05/28/2017   MRSA 2012   Past Surgical History:  Procedure Laterality Date   CATARACT EXTRACTION Bilateral     COLONOSCOPY     CYSTOSCOPY W/ URETERAL STENT PLACEMENT Right 03/18/2022   Procedure: CYSTOSCOPY WITH RETROGRADE PYELOGRAM, URETEROSCOPY;  Surgeon: Remi Haggard, MD;  Location: [facility];  Service: Urology;  Laterality: Right;   DEBRIDEMENT MANDIBLE N/A 12/07/2020   Procedure: DEBRIDEMENT OF MANDIBLE;  Surgeon: [name], DMD;  Location: [place];  Service: Oral Surgery;  Laterality: N/A;   ELBOW SURGERY  2012   elbow MRSA infection    EYE SURGERY     cataracts removed, /w IOL   GROSS TEETH DEBRIDEMENT N/A 12/29/2020   Procedure: MANDIBULAR  DEBRIDEMENT;  Surgeon: [name], DMD;  Location: [place];  Service: Dentistry;  Laterality: N/A;   INCISION AND DRAINAGE ABSCESS N/A 12/07/2020   Procedure: INCISION AND DRAINAGE MANDIBULAR ABSCESS;  Surgeon: [name], DMD;  Location: [place];  Service: Oral Surgery;  Laterality: N/A;   INGUINAL HERNIA REPAIR Right 01/14/2020   Procedure: OPEN RIGHT HERNIA REPAIR INGUINAL WITH MESH;  Surgeon: [name], MD;  Location: [facility];  Service: General;  Laterality: Right;   INSERTION OF MESH N/A 07/01/2016   Procedure: INSERTION OF MESH;  Surgeon: [name], MD;  Location: [place];  Service: General;  Laterality: N/A;  INTRAMEDULLARY (IM) NAIL INTERTROCHANTERIC Left 12/17/2021   Procedure: INTRAMEDULLARY (IM) NAIL INTERTROCHANTRIC;  Surgeon: Altamese Eddington, MD;  Location: [place];  Service: Orthopedics;  Laterality: Left;   IR URETERAL STENT LEFT NEW ACCESS W/O SEP NEPHROSTOMY CATH  02/02/2018   NEPHROLITHOTOMY Left 02/02/2018   Procedure: NEPHROLITHOTOMY PERCUTANEOUS;  Surgeon: [name], MD;  Location[facility];  Service: Urology;  Laterality: Left;   POLYPECTOMY     SCALP LACERATION REPAIR Right 10/16/2017   From fall/staples   SHOULDER ARTHROSCOPY Right 03/05/2018   Procedure: ARTHROSCOPY SHOULDER AND OPEN DISTAL CLAVICLE EXCISION;  Surgeon: [name], MD;  Location: [place];  Service: Orthopedics;  Laterality: Right;   SHOULDER  SURGERY     SINUS SURGERY WITH INSTATRAK     TEE WITHOUT CARDIOVERSION N/A 05/09/2017   Procedure: TRANSESOPHAGEAL ECHOCARDIOGRAM (TEE);  Surgeon: [name], MD;  [facility];  Service: Cardiovascular;  Laterality: N/A;   TOOTH EXTRACTION N/A 12/07/2020   Procedure: DENTAL EXTRACTIONS OF TEETH TWENTY TO THIRTY;  Surgeon: [name], DMD;  Location: [place];  Service: Oral Surgery;  Laterality: N/A;   TOOTH EXTRACTION N/A 12/29/2020   Procedure: DENTAL RESTORATION/EXTRACTIONS;  Surgeon: [name], DMD;  Location: [place];  Service: Dentistry;  Laterality: N/A;   TRANSTHORACIC ECHOCARDIOGRAM  02/22/2011   EF 55-65%; increased pattern of LVH with mild conc hypertrophy, abnormal relaxation & increased filling pressure (grade 2 diastolic dysfunction); atrial septum thickened (lipomatous hypertrophy)   UMBILICAL HERNIA REPAIR N/A 07/01/2016   Procedure: UMBILICAL HERNIA REPAIR WITH MESH;  Surgeon: [name], MD;  Location: [place];  Service: General;  Laterality: N/A;   UPPER GASTROINTESTINAL ENDOSCOPY     VASECTOMY      Home Medications:  (Not in a hospital admission)  Allergies:  Allergies  Allergen Reactions   Sulfonamide Derivatives Hives and Rash   Z-Pak [Azithromycin] Swelling   Dilaudid [Hydromorphone Hcl] Nausea And Vomiting   Sulfa Antibiotics Hives and Rash    Family History  Problem Relation Age of Onset   Heart disease Mother        CABG at age 61   Hyperlipidemia Mother    Hypertension Mother    Diabetes Father    Colon polyps Father    Stroke Father    Hyperlipidemia Father    Hypertension Father    Stroke Paternal Grandmother    Stroke Paternal Grandfather    Other Child        tetrology of fallot (cornealia deland syndrome)   Colon cancer Neg Hx    Esophageal cancer Neg Hx    Stomach cancer Neg Hx    Rectal cancer Neg Hx    Social History:  reports that he has never smoked. He has never been exposed to tobacco smoke. He has never  used smokeless tobacco. He reports that he does not drink alcohol and does not use drugs.  ROS: A complete review of systems was performed.  All systems are negative except for pertinent findings as noted. Review of Systems  Cardiovascular:  Positive for chest pain.  All other systems reviewed and are negative. He fell and broke ribs.    Physical Exam:  Vital signs in last 24 hours: Temp:  [100.5 F (38.1 C)] 100.5 F (38.1 C) (10/05 1945) Pulse Rate:  [36-107] 101 (10/05 2115) Resp:  [20-28] 20 (10/05 2115) BP: (107-142)/(55-81) 133/69 (10/05 2115) SpO2:  [93 %-98 %] 98 % (10/05 2115) General:  Alert and oriented, No acute distress HEENT:  Normocephalic, atraumatic Cardiovascular: Regular rate and rhythm Lungs: Regular rate and effort Abdomen: Soft, nontender, nondistended, no abdominal masses Back: No CVA tenderness Extremities: No edema Neurologic: Grossly intact  Laboratory Data:  Results for orders placed or performed during the hospital encounter of 03/28/22 (from the past 24 hour(s))  Urinalysis, Routine w reflex microscopic Urine, Clean Catch     Status: Abnormal   Collection Time: 03/28/22  7:36 PM  Result Value Ref Range   Color, Urine YELLOW YELLOW   APPearance CLEAR CLEAR   Specific Gravity, Urine 1.013 1.005 - 1.030   pH 7.0 5.0 - 8.0   Glucose, UA NEGATIVE NEGATIVE mg/dL   Hgb urine dipstick LARGE (A) NEGATIVE   Bilirubin Urine NEGATIVE NEGATIVE   Ketones, ur NEGATIVE NEGATIVE mg/dL   Protein, ur 30 (A) NEGATIVE mg/dL   Nitrite NEGATIVE NEGATIVE   Leukocytes,Ua NEGATIVE NEGATIVE   RBC / HPF >50 (H) 0 - 5 RBC/hpf   WBC, UA 6-10 0 - 5 WBC/hpf   Bacteria, UA NONE SEEN NONE SEEN   Squamous Epithelial / LPF 0-5 0 - 5   Mucus PRESENT   Comprehensive metabolic panel     Status: Abnormal   Collection Time: 03/28/22  7:50 PM  Result Value Ref Range   Sodium 142 135 - 145 mmol/L   Potassium 3.9 3.5 - 5.1 mmol/L   Chloride 111 98 - 111 mmol/L   CO2 25 22 - 32  mmol/L   Glucose, Bld 85 70 - 99 mg/dL   BUN 11 6 - 20 mg/dL   Creatinine, Ser 0.79 0.61 - 1.24 mg/dL   Calcium 9.3 8.9 - 10.3 mg/dL   Total Protein 6.6 6.5 - 8.1 g/dL   Albumin 3.6 3.5 - 5.0 g/dL   AST 35 15 - 41 U/L   ALT 24 0 - 44 U/L   Alkaline Phosphatase 373 (H) 38 - 126 U/L   Total Bilirubin 1.6 (H) 0.3 - 1.2 mg/dL   GFR, Estimated >60 >60 mL/min   Anion gap 6 5 - 15  Lactic acid, plasma     Status: Abnormal   Collection Time: 03/28/22  7:50 PM  Result Value Ref Range   Lactic Acid, Venous 2.6 (HH) 0.5 - 1.9 mmol/L  CBC with Differential     Status: Abnormal   Collection Time: 03/28/22  7:50 PM  Result Value Ref Range   WBC 14.1 (H) 4.0 - 10.5 K/uL   RBC 4.16 (L) 4.22 - 5.81 MIL/uL   Hemoglobin 12.4 (L) 13.0 - 17.0 g/dL   HCT 38.5 (L) 39.0 - 52.0 %   MCV 92.5 80.0 - 100.0 fL   MCH 29.8 26.0 - 34.0 pg   MCHC 32.2 30.0 - 36.0 g/dL   RDW 15.6 (H) 11.5 - 15.5 %   Platelets 159 150 - 400 K/uL   nRBC 0.0 0.0 - 0.2 %   Neutrophils Relative % 94 %   Neutro Abs 13.2 (H) 1.7 - 7.7 K/uL   Lymphocytes Relative 1 %   Lymphs Abs 0.2 (L) 0.7 - 4.0 K/uL   Monocytes Relative 3 %   Monocytes Absolute 0.5 0.1 - 1.0 K/uL   Eosinophils Relative 1 %   Eosinophils Absolute 0.1 0.0 - 0.5 K/uL   Basophils Relative 0 %   Basophils Absolute 0.0 0.0 - 0.1 K/uL   Immature Granulocytes 1 %   Abs Immature Granulocytes 0.07 0.00 - 0.07 K/uL  Protime-INR     Status: Abnormal   Collection Time:  03/28/22  7:50 PM  Result Value Ref Range   Prothrombin Time 15.3 (H) 11.4 - 15.2 seconds   INR 1.2 0.8 - 1.2  Ammonia     Status: Abnormal   Collection Time: 03/28/22  7:50 PM  Result Value Ref Range   Ammonia 40 (H) 9 - 35 umol/L  Resp Panel by RT-PCR (Flu A&B, Covid) Anterior Nasal Swab     Status: None   Collection Time: 03/28/22  7:55 PM   Specimen: Anterior Nasal Swab  Result Value Ref Range   SARS Coronavirus 2 by RT PCR NEGATIVE NEGATIVE   Influenza A by PCR NEGATIVE NEGATIVE   Influenza  B by PCR NEGATIVE NEGATIVE  Lactic acid, plasma     Status: Abnormal   Collection Time: 03/28/22  9:38 PM  Result Value Ref Range   Lactic Acid, Venous 2.1 (HH) 0.5 - 1.9 mmol/L   *Note: Due to a large number of results and/or encounters for the requested time period, some results have not been displayed. A complete set of results can be found in Results Review.   Recent Results (from the past 240 hour(s))  Urine Culture     Status: None   Collection Time: 03/19/22  6:30 AM   Specimen: Urine, Clean Catch  Result Value Ref Range Status   Specimen Description   Final    URINE, CLEAN CATCH Performed at [facility], [place] [address], [place], [place] [address]    Special Requests   Final    NONE Performed at [facility], [place] [address] [place], [place] [address]    Culture   Final    NO GROWTH Performed at [facility], [place] [address] [place], [place] [address]    Report Status 03/20/2022 FINAL  Final  Resp Panel by RT-PCR (Flu A&B, Covid) Anterior Nasal Swab     Status: None   Collection Time: 03/28/22  7:55 PM   Specimen: Anterior Nasal Swab  Result Value Ref Range Status   SARS Coronavirus 2 by RT PCR NEGATIVE NEGATIVE Final    Comment: (NOTE) SARS-CoV-2 target nucleic acids are NOT DETECTED.  The SARS-CoV-2 RNA is generally detectable in upper respiratory specimens during the acute phase of infection. The lowest concentration of SARS-CoV-2 viral copies this assay can detect is 138 copies/mL. A negative result does not preclude SARS-Cov-2 infection and should not be used as the sole basis for treatment or other patient management decisions. A negative result may occur with  improper specimen collection/handling, submission of specimen other than nasopharyngeal swab, presence of viral mutation(s) within the areas targeted by this assay, and inadequate number of viral copies(<138 copies/mL). A negative result must be combined  with clinical observations, patient history, and epidemiological information. The expected result is Negative.  Fact Sheet for Patients:  EntrepreneurPulse.com.au  Fact Sheet for Healthcare Providers:  IncredibleEmployment.be  This test is no t yet approved or cleared by the [place] FDA and  has been authorized for detection and/or diagnosis of SARS-CoV-2 by FDA under an Emergency Use Authorization (EUA). This EUA will remain  in effect (meaning this test can be used) for the duration of the COVID-19 declaration under Section 564(b)(1) of the Act, 21 U.S.C.section 360bbb-3(b)(1), unless the authorization is terminated  or revoked sooner.       Influenza A by PCR NEGATIVE NEGATIVE Final   Influenza B by PCR NEGATIVE NEGATIVE Final    Comment: (NOTE) The Xpert Xpress SARS-CoV-2/FLU/RSV plus assay  is intended as an aid in the diagnosis of influenza from Nasopharyngeal swab specimens and should not be used as a sole basis for treatment. Nasal washings and aspirates are unacceptable for Xpert Xpress SARS-CoV-2/FLU/RSV testing.  Fact Sheet for Patients: EntrepreneurPulse.com.au  Fact Sheet for Healthcare Providers: IncredibleEmployment.be  This test is not yet approved or cleared by the [place] FDA and has been authorized for detection and/or diagnosis of SARS-CoV-2 by FDA under an Emergency Use Authorization (EUA). This EUA will remain in effect (meaning this test can be used) for the duration of the COVID-19 declaration under Section 564(b)(1) of the Act, 21 U.S.C. section 360bbb-3(b)(1), unless the authorization is terminated or revoked.  Performed at [facility], [place] [address], [place], [place] [address]    Creatinine: Recent Labs    03/28/22 1950  CREATININE 0.79    Impression/Assessment/plan:  Left ureteral, renal stones-I discussed with the patient and his  wife the nature risk benefits and alternatives to cystoscopy left retrograde pyelogram and left ureteral stent placement.  We discussed no stent and only proceeding if he had recurrent fever, developed uncontrolled pain or a bump in his creatinine among other risks. They would like to go ahead and proceed with stent.  We discussed the rationale for staged procedure and depending on how he does over the next few weeks [name] could take for ureteroscopy versus continued observation.  Dr. Flossie Buffy is here interviewing him and the patient wants to do everything to "live".  He did not want to be DNR and also wanted to proceed with stent.   Festus Aloe 03/28/2022, 10:11 PM

## 2022-03-28 NOTE — Progress Notes (Signed)
A consult was received from an ED physician for vancomycin and cefepime per pharmacy dosing.  The patient's profile has been reviewed for ht/wt/allergies/indication/available labs.    A one time order has been placed for cefepime 2gm and 1234m IV x1.  Further antibiotics/pharmacy consults should be ordered by admitting physician if indicated.                       Thank you, PLynelle Doctor10/10/2021  7:59 PM

## 2022-03-28 NOTE — Therapy (Deleted)
OUTPATIENT PHYSICAL THERAPY TREATMENT NOTE   Patient Name: [name] [name] MRN: [id number] DOB:1961/02/06, 61 y.o., male Today's Date: 03/28/2022      Past Medical History:  Diagnosis Date   Adenomatous colon polyp 10/1999   Anxiety    Arthritis    neck, yoga helps.   Cataract    Complex partial seizure ([place])    last seizure was 08-30-2018   Crohn's disease of small and large intestines ([place]) 1999   Depression    Diabetes mellitus    no meds at this time 10-24-17   Esophageal stricture    External hemorrhoids    GERD (gastroesophageal reflux disease)    Glaucoma    History of kidney stones    3 large stones still present   Hyperlipemia    Hypertension    resolved with weight loss    Inguinal hernia, bilateral 12/2019   Liver cirrhosis secondary to NASH [facility])    Migraines    Osteoporosis    Pathological fracture of left hip due to osteoporosis ([place]) 12/18/2021   PONV (postoperative nausea and vomiting)    PTSD (post-traumatic stress disorder)    Renal cyst, acquired, left 07/08/2017   Skin cancer    squamous cell multiple; [place]; followed every 3 months.   Sleep apnea    CPAP machine, uses nightly   Staphylococcus aureus bacteremia with sepsis ([place]) 05/28/2017   MRSA 2012   Past Surgical History:  Procedure Laterality Date   CATARACT EXTRACTION Bilateral    COLONOSCOPY     CYSTOSCOPY W/ URETERAL STENT PLACEMENT Right 03/18/2022   Procedure: CYSTOSCOPY WITH RETROGRADE PYELOGRAM, URETEROSCOPY;  Surgeon: Remi Haggard, MD;  Location: [facility];  Service: Urology;  Laterality: Right;   DEBRIDEMENT MANDIBLE N/A 12/07/2020   Procedure: DEBRIDEMENT OF MANDIBLE;  Surgeon: [name], DMD;  Location: [place];  Service: Oral Surgery;  Laterality: N/A;   ELBOW SURGERY  2012   elbow MRSA infection    EYE SURGERY     cataracts removed, /w IOL   GROSS TEETH DEBRIDEMENT N/A 12/29/2020   Procedure: MANDIBULAR  DEBRIDEMENT;  Surgeon: [name], DMD;  Location: [place];  Service: Dentistry;  Laterality: N/A;   INCISION AND DRAINAGE ABSCESS N/A 12/07/2020   Procedure: INCISION AND DRAINAGE MANDIBULAR ABSCESS;  Surgeon: [name], DMD;  Location: [place];  Service: Oral Surgery;  Laterality: N/A;   INGUINAL HERNIA REPAIR Right 01/14/2020   Procedure: OPEN RIGHT HERNIA REPAIR INGUINAL WITH MESH;  Surgeon: [name], MD;  [facility];  Service: General;  Laterality: Right;   INSERTION OF MESH N/A 07/01/2016   Procedure: INSERTION OF MESH;  Surgeon: [name], MD;  Location: [place];  Service: General;  Laterality: N/A;   INTRAMEDULLARY (IM) NAIL INTERTROCHANTERIC Left 12/17/2021   Procedure: INTRAMEDULLARY (IM) NAIL INTERTROCHANTRIC;  Surgeon: Altamese Barnard, MD;  Location: [place];  Service: Orthopedics;  Laterality: Left;   IR URETERAL STENT LEFT NEW ACCESS W/O SEP NEPHROSTOMY CATH  02/02/2018   NEPHROLITHOTOMY Left 02/02/2018   Procedure: NEPHROLITHOTOMY PERCUTANEOUS;  Surgeon: [name], MD;  [facility];  Service: Urology;  Laterality: Left;   POLYPECTOMY     SCALP LACERATION REPAIR Right 10/16/2017   From fall/staples   SHOULDER ARTHROSCOPY Right 03/05/2018   Procedure: ARTHROSCOPY SHOULDER AND OPEN DISTAL CLAVICLE EXCISION;  Surgeon: [name], MD;  Location: [place];  Service: Orthopedics;  Laterality: Right;   SHOULDER SURGERY     SINUS SURGERY WITH INSTATRAK  TEE WITHOUT CARDIOVERSION N/A 05/09/2017   Procedure: TRANSESOPHAGEAL ECHOCARDIOGRAM (TEE);  Surgeon: [name], MD;  Location: [facility];  Service: Cardiovascular;  Laterality: N/A;   TOOTH EXTRACTION N/A 12/07/2020   Procedure: DENTAL EXTRACTIONS OF TEETH TWENTY TO THIRTY;  Surgeon: [name], DMD;  Location: [place];  Service: Oral Surgery;  Laterality: N/A;   TOOTH EXTRACTION N/A 12/29/2020   Procedure: DENTAL RESTORATION/EXTRACTIONS;  Surgeon: [name], DMD;  Location: [place];  Service: Dentistry;  Laterality: N/A;   TRANSTHORACIC  ECHOCARDIOGRAM  02/22/2011   EF 55-65%; increased pattern of LVH with mild conc hypertrophy, abnormal relaxation & increased filling pressure (grade 2 diastolic dysfunction); atrial septum thickened (lipomatous hypertrophy)   UMBILICAL HERNIA REPAIR N/A 07/01/2016   Procedure: UMBILICAL HERNIA REPAIR WITH MESH;  Surgeon: [name], MD;  Location: [place];  Service: General;  Laterality: N/A;   UPPER GASTROINTESTINAL ENDOSCOPY     VASECTOMY     Patient Active Problem List   Diagnosis Date Noted   Hepatocellular carcinoma ([place]) 03/25/2022   Cirrhosis of liver without ascites ([place])    Urinary tract infection without hematuria    Sepsis secondary to UTI ([place]) 03/17/2022   Hyperbilirubinemia 03/17/2022   Hydronephrosis of right kidney 03/17/2022   Nephrolith 03/17/2022   Protein-calorie malnutrition, severe 12/19/2021   Pathological fracture of left hip due to osteoporosis ([place]) 12/18/2021   Closed nondisplaced intertrochanteric fracture of left femur, initial encounter ([place]) 12/17/2021   Rib fracture 12/17/2021   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 05/11/2021   Liver cirrhosis secondary to NASH with liver mass     GERD (gastroesophageal reflux disease)    Pancytopenia (H[facility])    Osteomyelitis of jaw 12/29/2020   Acute osteomyelitis of mandible 12/26/2020   Sepsis ([place]) 12/04/2020   S/P inguinal hernia repair 01/14/2020   At high risk for falls    S/P shoulder surgery 03/05/2018   Excessive daytime sleepiness 02/18/2018   Crohn's disease of colon with rectal bleeding ([place]) 02/18/2018   Iron deficiency anemia due to chronic blood loss and thrombocytopenia  02/18/2018   Iron deficiency anemia due to sideropenic dysphagia 02/18/2018   Renal calculus, left 02/02/2018   History of sepsis 12/16/2017   Renal cyst, acquired, left 07/08/2017   Frequent falls 07/08/2017   Renal lesion 05/28/2017   MSSA bacteremia 05/06/2017   Depression 04/30/2016   Memory loss 04/30/2016   OSA  on CPAP 10/31/2015   Nonepileptic episode ([place]) 04/25/2015   Seizure ([place]) 04/04/2013   DOE (dyspnea on exertion) 03/12/2013   Complex partial seizure ([place]) 04/19/2012   DM2 (diabetes mellitus, type 2) ([place]) 09/25/2011   OTHER DYSPHAGIA 03/27/2009   GERD 12/07/2007   EXTERNAL HEMORRHOIDS 09/11/2007   CROHN'S DISEASE, LARGE AND SMALL INTESTINES 09/11/2007   OSTEOPOROSIS 09/11/2007    PCP: [name], MD   REFERRING PROVIDER: [name], MD   REFERRING DIAG: R29.6 (ICD-10-CM) - Repeated falls S72.009A (ICD-10-CM) - Hip fracture ([place])   THERAPY DIAG:  Other abnormalities of gait and mobility  Muscle weakness (generalized)  Repeated falls  Unspecified lack of coordination  Lack of coordination  Rationale for Evaluation and Treatment Rehabilitation  ONSET DATE: 12/17/2021  SUBJECTIVE:   SUBJECTIVE STATEMENT: I am struggling cognitivley today. Hip is just ok. Sorry I am late.  PERTINENT HISTORY: Seizure disorder, chron's, T2DM, depression and anxiety, GERD with hx of esophageal stricture, HTN, HLD, liver cirrhosis secondary to NASH, OSA on cpap, keratinizing SCC of the left cheek  currently on neoadjuvant cemiplimab. Surgical resection on 09/18/21.  PAIN:  Are you having pain? Yes: NPRS scale: 6-8/10 Pain location: right shoulder, left hip Pain description: throbbing Aggravating factors: increased activity Relieving factors: medication  PRECAUTIONS: Fall  WEIGHT BEARING RESTRICTIONS No  FALLS:  Has patient fallen in last 6 months? Yes. Number of falls 1 major fall and several losses of balance  LIVING ENVIRONMENT: Lives with: lives with their spouse Lives in: House/apartment Stairs: Yes: Internal: 14 steps; on right going up and External: 2 steps; on left going up Has following equipment at home: Single point cane, Environmental consultant - 2 wheeled, Wheelchair (manual), Electronics engineer, and Grab bars  OCCUPATION: retired  PLOF: Independent with household mobility with  device  PATIENT GOALS:  Decrease risk of falling, improve balance, and get stronger.   OBJECTIVE:   DIAGNOSTIC FINDINGS:  Left hip radiograph on 12/17/21: IMPRESSION: Mildly displaced intertrochanteric fracture of the left hip  PATIENT SURVEYS:  LEFS TBA  COGNITION:  Overall cognitive status: Within functional limits for tasks assessed     MUSCLE LENGTH: Tightness in bilat hamstrings  POSTURE: rounded shoulders and forward head  PALPATION: Some tenderness to palpation along right shoulder and left hip  LOWER EXTREMITY ROM:  WFL  LOWER EXTREMITY MMT:  03/06/2022: Right shoulder strength of 4/5 Left hip/knee strength of 4/5 Right hip strength of 4+/5, Left knee strength of 5/5  03/08/2022: 3 minute walk test:  499 ft without assistive device with SBA   FUNCTIONAL TESTS:  03/06/2022: 5 times sit to stand: 22.4 sec with UE use .  Pt with increased difficulty on first stand.  GAIT: Distance walked: 100 ft Assistive device utilized: Single point cane Level of assistance: SBA Comments: Pt with antalgic gait with some unsteadiness noted    TODAY'S TREATMENT:   03/25/22: Blue loop clamshells seated 15x 2# marching 10x Bil  LAQ 2# Bil 10x  Seated bicep curls 2# 10x Shoulder lifts 2# 10x Bil Side stepping at BArre with blue loop above knees 4x Standing heel raises 15x:  Sit to stand 75% LE 25% UE 2x 5 as legs were getting very wobbly Ambulated down hall way with RW due to LE instability post exercises with SBA/CGA for safety. Pt picks up walker often and walks very fast. VC for control and keeping walker on floor.       03/15/22: Nustep level 3 x 8 min with PTA present to discuss status LAQ 0# 10x, added 1.5# 10x Bil Seated clamshell blue loop 10x Seated march 1.5# 10x Sit to stand LE 70%/UE 30% 10x Blue loop clamshells 10x Standing at [place]: calf raises 10x, hip abduction 10x, side stepping with sliding of hands 4 lengths,  1# shoulder 3 ways 10x each  alternating  03/08/2022: Nustep level 3 x7 min with PT present to discuss status Seated with 2: heel/toe raises, LAQ, marching, hip abduction scissors, hip adduction ball squeeze, hip ER.  BLE 2x10 bilat each Sit to/from holding 5# kettlebell:  x10 with chest press, x10 with overhead press Standing and performing ball toss x20 throws 3 min walk test:  499 ft without assistive device with SBA and occasional veering off path and brushing against walls   03/06/2022:  Reviewed HEP and safety at home with use of [facility]   PATIENT EDUCATION:  Education details: Issued HEP Person educated: Patient and Spouse Education method: Explanation, Demonstration, and Handouts Education comprehension: verbalized understanding   HOME EXERCISE PROGRAM: Access Code: [email or web address] URL: [email or web address] Date: 03/06/2022 Prepared by: Shelby Dubin  [place]  Exercises - Seated Long Arc Quad  - 1 x daily - 7 x weekly - 2 sets - 10 reps - Seated March  - 1 x daily - 7 x weekly - 2 sets - 10 reps - Standing March with Counter Support  - 1 x daily - 7 x weekly - 2 sets - 10 reps - Standing Hip Abduction with Counter Support  - 1 x daily - 7 x weekly - 2 sets - 10 reps  ASSESSMENT:  CLINICAL IMPRESSION: Pt arrives with his wife late for todays appt bc they just came from another MD appt. Pt reports he did not feel good "cognitively" but his legs were ok. LE became very unstable after standing heel raises. We used RW for ambulation towards end of session due to the LE instability.   OBJECTIVE IMPAIRMENTS decreased balance, decreased coordination, difficulty walking, decreased strength, dizziness, impaired flexibility, postural dysfunction, and pain.   ACTIVITY LIMITATIONS carrying, lifting, bending, squatting, and stairs  PARTICIPATION LIMITATIONS: community activity and yard work  [place], Time since onset of injury/illness/exacerbation, and 3+ comorbidities: Hx of cancer, dizziness,  Seizure disorder  are also affecting patient's functional outcome.   REHAB POTENTIAL: Good  CLINICAL DECISION MAKING: Evolving/moderate complexity  EVALUATION COMPLEXITY: Moderate   GOALS: Goals reviewed with patient? Yes  SHORT TERM GOALS: Target date: 03/27/2022  Pt will be independent with initial HEP. Baseline: Goal status: Goal met 03/15/22  2.  Pt will be able to perform sit to/from stand from various surfaces independent with good safety on first attempt and independent. Baseline:  Goal status: INITIAL   LONG TERM GOALS: Target date: 05/01/2022   Pt will be independent with advanced HEP. Baseline:  Goal status: INITIAL  2.  Pt will increase left hip and right shoulder strength to at least 4+/5 to allow him to perform household tasks with improved ease. Baseline:  Goal status: INITIAL  3.  Pt will decrease 5 times sit to/from stand time to in 15 seconds or less to indicate improved functional mobility. Baseline:  Goal status: INITIAL  4.  Pt will be able to ambulate at least 30 minutes to walk the dog without a loss of balance and without increased hip pain. Baseline:  Goal status: INITIAL  5.  Pt and wife will report improved safety at home with no falls/losses of balance in week prior to discharge. Baseline:  Goal status: INITIAL   PLAN: PT FREQUENCY: 1-2x/week  PT DURATION: 8 weeks  PLANNED INTERVENTIONS: Therapeutic exercises, Therapeutic activity, Neuromuscular re-education, Balance training, Gait training, Patient/Family education, Self Care, Joint mobilization, Joint manipulation, Stair training, Aquatic Therapy, Dry Needling, Electrical stimulation, Spinal manipulation, Spinal mobilization, Cryotherapy, Moist heat, Taping, Ultrasound, Ionotophoresis 44m/ml Dexamethasone, Manual therapy, and Re-evaluation  PLAN FOR NEXT SESSION: assess and progress with HEP as indicated, strengthening, balance   Jayzon Taras, PTA 03/28/2022, 10:39 PM   [facility] [address] [place]  [id number] # [phone] [phone]

## 2022-03-29 ENCOUNTER — Encounter (HOSPITAL_COMMUNITY): Payer: Self-pay | Admitting: Urology

## 2022-03-29 ENCOUNTER — Ambulatory Visit: Payer: Medicare Other | Admitting: Physical Therapy

## 2022-03-29 DIAGNOSIS — R279 Unspecified lack of coordination: Secondary | ICD-10-CM

## 2022-03-29 DIAGNOSIS — M6281 Muscle weakness (generalized): Secondary | ICD-10-CM

## 2022-03-29 DIAGNOSIS — R6520 Severe sepsis without septic shock: Secondary | ICD-10-CM | POA: Diagnosis not present

## 2022-03-29 DIAGNOSIS — R2689 Other abnormalities of gait and mobility: Secondary | ICD-10-CM

## 2022-03-29 DIAGNOSIS — G9341 Metabolic encephalopathy: Secondary | ICD-10-CM | POA: Diagnosis not present

## 2022-03-29 DIAGNOSIS — A419 Sepsis, unspecified organism: Secondary | ICD-10-CM | POA: Diagnosis not present

## 2022-03-29 DIAGNOSIS — R296 Repeated falls: Secondary | ICD-10-CM

## 2022-03-29 DIAGNOSIS — D696 Thrombocytopenia, unspecified: Secondary | ICD-10-CM

## 2022-03-29 DIAGNOSIS — K5090 Crohn's disease, unspecified, without complications: Secondary | ICD-10-CM | POA: Insufficient documentation

## 2022-03-29 LAB — CBC
HCT: 32 % — ABNORMAL LOW (ref 39.0–52.0)
Hemoglobin: 10.1 g/dL — ABNORMAL LOW (ref 13.0–17.0)
MCH: 29.8 pg (ref 26.0–34.0)
MCHC: 31.6 g/dL (ref 30.0–36.0)
MCV: 94.4 fL (ref 80.0–100.0)
Platelets: 97 10*3/uL — ABNORMAL LOW (ref 150–400)
RBC: 3.39 MIL/uL — ABNORMAL LOW (ref 4.22–5.81)
RDW: 15.7 % — ABNORMAL HIGH (ref 11.5–15.5)
WBC: 11.8 10*3/uL — ABNORMAL HIGH (ref 4.0–10.5)
nRBC: 0 % (ref 0.0–0.2)

## 2022-03-29 LAB — LACTIC ACID, PLASMA
Lactic Acid, Venous: 0.4 mmol/L — ABNORMAL LOW (ref 0.5–1.9)
Lactic Acid, Venous: 2.8 mmol/L (ref 0.5–1.9)
Lactic Acid, Venous: 2.1 mmol/L (ref 0.5–1.9)

## 2022-03-29 LAB — GLUCOSE, CAPILLARY: Glucose-Capillary: 102 mg/dL — ABNORMAL HIGH (ref 70–99)

## 2022-03-29 MED ORDER — SERTRALINE HCL 100 MG PO TABS
200.0000 mg | ORAL_TABLET | Freq: Every day | ORAL | Status: DC
  Administered 2022-03-29 – 2022-04-01 (×4): 200 mg via ORAL
  Filled 2022-03-29 (×4): qty 2

## 2022-03-29 MED ORDER — ACETAMINOPHEN 500 MG PO TABS
1000.0000 mg | ORAL_TABLET | Freq: Every day | ORAL | Status: DC
  Administered 2022-03-29 – 2022-03-31 (×4): 1000 mg via ORAL
  Filled 2022-03-29 (×4): qty 2

## 2022-03-29 MED ORDER — SODIUM CHLORIDE 0.9 % IV SOLN
2.0000 g | INTRAVENOUS | Status: AC
  Administered 2022-03-29: 2 g via INTRAVENOUS
  Filled 2022-03-29: qty 12.5

## 2022-03-29 MED ORDER — OXYCODONE HCL 5 MG PO TABS
5.0000 mg | ORAL_TABLET | Freq: Every day | ORAL | Status: DC | PRN
  Administered 2022-03-29: 5 mg via ORAL
  Administered 2022-03-31: 10 mg via ORAL
  Filled 2022-03-29: qty 2
  Filled 2022-03-29: qty 1

## 2022-03-29 MED ORDER — LAMOTRIGINE 150 MG PO TABS
150.0000 mg | ORAL_TABLET | Freq: Two times a day (BID) | ORAL | Status: DC
  Administered 2022-03-29 – 2022-04-01 (×8): 150 mg via ORAL
  Filled 2022-03-29 (×9): qty 1

## 2022-03-29 MED ORDER — FESOTERODINE FUMARATE ER 4 MG PO TB24
4.0000 mg | ORAL_TABLET | Freq: Every day | ORAL | Status: DC
  Administered 2022-03-29 – 2022-04-01 (×4): 4 mg via ORAL
  Filled 2022-03-29 (×4): qty 1

## 2022-03-29 MED ORDER — TAMSULOSIN HCL 0.4 MG PO CAPS
0.4000 mg | ORAL_CAPSULE | Freq: Every day | ORAL | Status: DC
  Administered 2022-03-29 – 2022-03-31 (×4): 0.4 mg via ORAL
  Filled 2022-03-29 (×4): qty 1

## 2022-03-29 MED ORDER — VANCOMYCIN HCL IN DEXTROSE 1-5 GM/200ML-% IV SOLN
1000.0000 mg | Freq: Two times a day (BID) | INTRAVENOUS | Status: DC
  Administered 2022-03-29 – 2022-03-31 (×5): 1000 mg via INTRAVENOUS
  Filled 2022-03-29 (×5): qty 200

## 2022-03-29 MED ORDER — FOLIC ACID 1 MG PO TABS
2.0000 mg | ORAL_TABLET | Freq: Every day | ORAL | Status: DC
  Administered 2022-03-29 – 2022-04-01 (×4): 2 mg via ORAL
  Filled 2022-03-29 (×4): qty 2

## 2022-03-29 MED ORDER — PANTOPRAZOLE SODIUM 40 MG PO TBEC
80.0000 mg | DELAYED_RELEASE_TABLET | Freq: Every day | ORAL | Status: DC
  Administered 2022-03-29 – 2022-04-01 (×4): 80 mg via ORAL
  Filled 2022-03-29 (×4): qty 2

## 2022-03-29 MED ORDER — RIFAXIMIN 550 MG PO TABS
550.0000 mg | ORAL_TABLET | Freq: Two times a day (BID) | ORAL | Status: DC
  Administered 2022-03-29 – 2022-04-01 (×8): 550 mg via ORAL
  Filled 2022-03-29 (×8): qty 1

## 2022-03-29 MED ORDER — ALBUMIN HUMAN 5 % IV SOLN
INTRAVENOUS | Status: AC
  Filled 2022-03-29: qty 250

## 2022-03-29 MED ORDER — ALBUMIN HUMAN 5 % IV SOLN
12.5000 g | Freq: Once | INTRAVENOUS | Status: AC
  Administered 2022-03-29: 12.5 g via INTRAVENOUS

## 2022-03-29 MED ORDER — SODIUM CHLORIDE 0.9 % IV SOLN
100.0000 mg | Freq: Two times a day (BID) | INTRAVENOUS | Status: DC
  Administered 2022-03-29: 100 mg via INTRAVENOUS
  Filled 2022-03-29: qty 100

## 2022-03-29 MED ORDER — ENSURE ENLIVE PO LIQD
237.0000 mL | Freq: Two times a day (BID) | ORAL | Status: DC
  Administered 2022-03-29 – 2022-04-01 (×3): 237 mL via ORAL

## 2022-03-29 MED ORDER — ACITRETIN 25 MG PO CAPS
25.0000 mg | ORAL_CAPSULE | Freq: Every day | ORAL | Status: DC
  Administered 2022-03-30 – 2022-04-01 (×3): 25 mg via ORAL

## 2022-03-29 MED ORDER — MESALAMINE 1.2 G PO TBEC
2.4000 g | DELAYED_RELEASE_TABLET | Freq: Every day | ORAL | Status: DC
  Administered 2022-03-29 – 2022-04-01 (×4): 2.4 g via ORAL
  Filled 2022-03-29 (×4): qty 2

## 2022-03-29 MED ORDER — SODIUM CHLORIDE 0.9 % IV SOLN
2.0000 g | Freq: Three times a day (TID) | INTRAVENOUS | Status: DC
  Administered 2022-03-29 – 2022-03-31 (×6): 2 g via INTRAVENOUS
  Filled 2022-03-29 (×6): qty 12.5

## 2022-03-29 MED ORDER — METHSCOPOLAMINE BROMIDE 5 MG PO TABS: 5.0000 mg | ORAL_TABLET | Freq: Two times a day (BID) | ORAL | Status: DC

## 2022-03-29 MED ORDER — GABAPENTIN 300 MG PO CAPS
300.0000 mg | ORAL_CAPSULE | Freq: Two times a day (BID) | ORAL | Status: DC
  Administered 2022-03-29 – 2022-03-31 (×6): 300 mg via ORAL
  Filled 2022-03-29 (×6): qty 1

## 2022-03-29 MED ORDER — SODIUM CHLORIDE 0.9 % IV BOLUS
500.0000 mL | Freq: Once | INTRAVENOUS | Status: AC
  Administered 2022-03-29: 500 mL via INTRAVENOUS

## 2022-03-29 MED ORDER — POTASSIUM CHLORIDE CRYS ER 20 MEQ PO TBCR
20.0000 meq | EXTENDED_RELEASE_TABLET | Freq: Two times a day (BID) | ORAL | Status: DC
  Administered 2022-03-29 – 2022-04-01 (×8): 20 meq via ORAL
  Filled 2022-03-29 (×8): qty 1

## 2022-03-29 NOTE — Progress Notes (Signed)
Pharmacy Antibiotic Note  [name] [name] is a 61 y.o. male admitted on 03/28/2022 with sepsis 2/2 left ureteral stones (s/p stent placement 10/5), possible PNA. Pharmacy has been consulted for Vancomycin and Cefepime dosing. Received dose of each on 10/5.  Plan: Vancomycin 1g IV q12h to keep AUC 400-550. Vancomycin levels at steady state, as indicated. Cefepime 2g IV q8h. Monitor renal function, cultures, clinical course.   Height: 5' 11"  (180.3 cm) Weight: 69.6 kg (153 lb 7 oz) IBW/kg (Calculated) : 75.3  Temp (24hrs), Avg:99.2 F (37.3 C), Min:98.2 F (36.8 C), Max:100.5 F (38.1 C)  Recent Labs  Lab 03/28/22 1950 03/28/22 2138 03/29/22 0143 03/29/22 0529  WBC 14.1*  --  11.8*  --   CREATININE 0.79  --   --   --   LATICACIDVEN 2.6* 2.1* 0.4* 2.8*    Estimated Creatinine Clearance: 96.7 mL/min (by C-G formula based on SCr of 0.79 mg/dL).    Allergies  Allergen Reactions   Sulfonamide Derivatives Hives and Rash   Z-Pak [Azithromycin] Swelling   Dilaudid [Hydromorphone Hcl] Nausea And Vomiting   Sulfa Antibiotics Hives and Rash    Antimicrobials this admission:  10/5 Vancomycin x 1, resumed 10/6 >> 10/5 Cefepime x 1, resumed 10/6 >> 10/6 CTX >> 10/6 10/6 Doxycycline >> 10/6   Dose adjustments this admission:  --   Microbiology results:  10/5 BCx:  10/5 UCx:  10/5 Resp panel: COVID negative, Influenza A/B negative   Thank you for allowing pharmacy to be a part of this patient's care.   Lindell Spar, PharmD, BCPS Clinical Pharmacist  03/29/2022 8:29 AM

## 2022-03-29 NOTE — Assessment & Plan Note (Signed)
-   Continue gabapentin and Lamictal

## 2022-03-29 NOTE — Progress Notes (Signed)
Late entry, the pt was TX from PACU alert x 4 able to answer admission questions. Skin dry  with skin tear noted to bil feet, left great toe and bil heals, heal form places, bath completed and moisturized  lotion applied.Pt resting in bed , respiration even and unlabored, no distress noted on exam. Plan of care on going.

## 2022-03-29 NOTE — Assessment & Plan Note (Signed)
Hgb stable relative to baseline

## 2022-03-29 NOTE — Progress Notes (Signed)
Progress Note   Patient: [name] [name] SUP:[id number] DOB: [birth year] DOA: 03/28/2022     1 DOS: the patient was seen and examined on 03/29/2022 at 10:21AM      Brief hospital course: Mr. Sheils is a 61 y.o. M with Crohn's disease on mesalamine/methscopolamine, NASH cirrhosis well compensated, HCC and squamous skin cancer, OSA on CPAP, complex partial seizures, DM, esophageal stricture, mandibular osteomyelitis resolved, and history staph bacteremia who presented with altered mental status fever for 1 day after finishing recent course antibiotics.  9/24-28: Presented with UTI sepsis, went for cystoscopy, seemed to have passed the stone seen on admission imaging, discharged with 10 days antibioitics  10/3:  Finished discharge Abx  10/5: Woke with fever, confusion, in the ER was septic and encephalopathic. Imaging showed hydronephrosis. Taken to OR for LEFT ureteral stent     Assessment and Plan: * Sepsis with acute end organ damage P/w fever, tachycardia, tachypnea, leukocytosis, lactic acid >2, hypotension <59 systolic x2, and acute metabolic encephalopathy.    Source likely infected/impacted ureteral stone. Pneumonia ruled out.   - Follow urine culture - Broaden back to vancomycin/cefepime - Follow blood cultures  Iron deficiency anemia due to chronic blood loss and thrombocytopenia  Hgb stable relative to baseline  DM2 (diabetes mellitus, type 2) (HCC) HgbA1c 4.6 in June not on meds.  Liver cirrhosis secondary to NASH with liver mass  Compensated  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 Follows at [facility] - Continue Xifaxan  OSA on CPAP - CPAP at night  Thrombocytopenia (HCC) Platelets <100K this morning, likely due to sepsis.  No clinical bleeding. - Trend Plts, treat sepsis  Acute metabolic encephalopathy I think this is primarily from sepsis.  Less likely from hepatic encephalopathy.  Appears to have improved overnight with fluids and antibiotics. - Continue  treatment of underlying sepsis - Agree with holding tramadol and Lunesta  - Standard delirium precautions: blinds open and lights on during day, TV off, minimize interruptions at night, glasses/hearing aids, PT/OT, avoiding Beers list medications     Crohn's disease ([place]) - Continue mesalamine and methscopolamine  Ureteral obstruction, left Recent bilateral hydronephrosis, now with left hydro.   S/P ureteral stent on left placed by Dr. Junious Silk 10/5 - Urology follow up  Hepatocellular carcinoma [facility])    Complex partial seizures - Continue gabapentin and Lamictal          Subjective: Patient still slightly confused today but improved from yesterday, no vomiting, no fever, no back pain.  He has some chronic right arm pain.     Physical Exam: BP (!) 100/59 (BP Location: Left Arm)   Pulse 76   Temp 98.1 F (36.7 C) (Oral)   Resp 16   Ht 5' 11"  (1.803 m)   Wt 69.6 kg   SpO2 100%   BMI 21.40 kg/m   Thin adult male, lying in bed, appears weak and tired RRR, no murmurs, no peripheral edema Respiratory rate normal, lungs clear without rales or wheezes no focal tenderness palpation, no guarding Attention normal, affect appropriate, judgment insight appear slightly impaired    Data Reviewed: CT of the abdomen and pelvis shows possible pneumonia, and a 7 x 12 mm left ureteral stone Comprehensive metabolic panel normal White blood cell count 14 down to 11 today Hemoglobin 10 Platelets 97,000    Family Communication: Wife at the bedside    Disposition: Status is: Inpatient Patient mated with sepsis due to infected ureteral stone  The stone is been stented  and he is stabilized  We will continue IV antibiotic treatment and monitor for culture maturity.        Author: Edwin Dada, MD 03/29/2022 3:25 PM  For on call review [email or web address].

## 2022-03-29 NOTE — Assessment & Plan Note (Addendum)
I think this is primarily from sepsis.  Less likely from hepatic encephalopathy.  Appears to have improved overnight with fluids and antibiotics. - Continue treatment of underlying sepsis - Agree with holding tramadol and Lunesta  - Standard delirium precautions: blinds open and lights on during day, TV off, minimize interruptions at night, glasses/hearing aids, PT/OT, avoiding Beers list medications

## 2022-03-29 NOTE — Progress Notes (Signed)
[place] aware that the Pt's lactic acid this am is 2.8. No  new orders given at this time. Plan of care on going.

## 2022-03-29 NOTE — Hospital Course (Signed)
[name] [name] is a 61 y.o. M with Crohn's disease on mesalamine/methscopolamine, NASH cirrhosis well compensated, HCC and squamous skin cancer, OSA on CPAP, complex partial seizures, DM, esophageal stricture, mandibular osteomyelitis resolved, and history staph bacteremia who presented with altered mental status fever for 1 day after finishing recent course antibiotics.  9/24-28: Presented with UTI sepsis, went for cystoscopy, seemed to have passed the stone seen on admission imaging, discharged with 10 days antibioitics  10/3:  Finished discharge Abx  10/5: Woke with fever, confusion, in the ER was septic and encephalopathic. Imaging showed hydronephrosis. Taken to OR for LEFT ureteral stent

## 2022-03-29 NOTE — Assessment & Plan Note (Addendum)
Follows at [facility]

## 2022-03-29 NOTE — Assessment & Plan Note (Addendum)
HgbA1c 4.6 in June not on meds.

## 2022-03-29 NOTE — Assessment & Plan Note (Addendum)
P/w fever, tachycardia, tachypnea, leukocytosis, lactic acid >2, hypotension <68 systolic x2, and acute metabolic encephalopathy.    Source likely infected/impacted ureteral stone. Pneumonia ruled out.   - Follow urine culture - Broaden back to vancomycin/cefepime - Follow blood cultures

## 2022-03-29 NOTE — Assessment & Plan Note (Signed)
-   CPAP at night

## 2022-03-29 NOTE — Assessment & Plan Note (Signed)
Continue Lamictal

## 2022-03-29 NOTE — Assessment & Plan Note (Addendum)
Recent bilateral hydronephrosis, now with left hydro.   S/P ureteral stent on left placed by Dr. Junious Silk 10/5 - Urology follow up

## 2022-03-29 NOTE — Progress Notes (Signed)
  Transition of Care [facility]) Screening Note   Patient Details  Name: [name] [name] Date of Birth: 16-Apr-1961   [facility] of [facility]) CM/SW Contact:    Dessa Phi, RN Phone Number: 03/29/2022, 2:18 PM    Transition of Care Department [facility]) has reviewed patient and no TOC needs have been identified at this time. We will continue to monitor patient advancement through interdisciplinary progression rounds. If new patient transition needs arise, please place a TOC consult.

## 2022-03-29 NOTE — Assessment & Plan Note (Signed)
Platelets <100K this morning, likely due to sepsis.  No clinical bleeding. - Trend Plts, treat sepsis

## 2022-03-29 NOTE — Assessment & Plan Note (Addendum)
-   Continue mesalamine and methscopolamine

## 2022-03-29 NOTE — Assessment & Plan Note (Addendum)
Compensated

## 2022-03-30 DIAGNOSIS — A419 Sepsis, unspecified organism: Secondary | ICD-10-CM | POA: Diagnosis not present

## 2022-03-30 DIAGNOSIS — G40209 Localization-related (focal) (partial) symptomatic epilepsy and epileptic syndromes with complex partial seizures, not intractable, without status epilepticus: Secondary | ICD-10-CM

## 2022-03-30 DIAGNOSIS — K50919 Crohn's disease, unspecified, with unspecified complications: Secondary | ICD-10-CM

## 2022-03-30 DIAGNOSIS — G9341 Metabolic encephalopathy: Secondary | ICD-10-CM | POA: Diagnosis not present

## 2022-03-30 LAB — CBC
HCT: 27.6 % — ABNORMAL LOW (ref 39.0–52.0)
Hemoglobin: 8.9 g/dL — ABNORMAL LOW (ref 13.0–17.0)
MCH: 30.1 pg (ref 26.0–34.0)
MCHC: 32.2 g/dL (ref 30.0–36.0)
MCV: 93.2 fL (ref 80.0–100.0)
Platelets: 97 10*3/uL — ABNORMAL LOW (ref 150–400)
RBC: 2.96 MIL/uL — ABNORMAL LOW (ref 4.22–5.81)
RDW: 15.7 % — ABNORMAL HIGH (ref 11.5–15.5)
WBC: 6 10*3/uL (ref 4.0–10.5)
nRBC: 0 % (ref 0.0–0.2)

## 2022-03-30 LAB — COMPREHENSIVE METABOLIC PANEL
ALT: 21 U/L (ref 0–44)
AST: 37 U/L (ref 15–41)
Albumin: 2.6 g/dL — ABNORMAL LOW (ref 3.5–5.0)
Alkaline Phosphatase: 244 U/L — ABNORMAL HIGH (ref 38–126)
Anion gap: 4 — ABNORMAL LOW (ref 5–15)
BUN: 15 mg/dL (ref 6–20)
CO2: 24 mmol/L (ref 22–32)
Calcium: 8.3 mg/dL — ABNORMAL LOW (ref 8.9–10.3)
Chloride: 110 mmol/L (ref 98–111)
Creatinine, Ser: 0.63 mg/dL (ref 0.61–1.24)
GFR, Estimated: >60 mL/min (ref >60)
Glucose, Bld: 114 mg/dL — ABNORMAL HIGH (ref 70–99)
Potassium: 3.5 mmol/L (ref 3.5–5.1)
Sodium: 138 mmol/L (ref 135–145)
Total Bilirubin: 0.9 mg/dL (ref 0.3–1.2)
Total Protein: 5 g/dL — ABNORMAL LOW (ref 6.5–8.1)

## 2022-03-30 LAB — LACTIC ACID, PLASMA: Lactic Acid, Venous: 1.9 mmol/L (ref 0.5–1.9)

## 2022-03-30 LAB — URINE CULTURE: Culture: NO GROWTH

## 2022-03-30 LAB — MRSA NEXT GEN BY PCR, NASAL: MRSA by PCR Next Gen: NOT DETECTED

## 2022-03-30 NOTE — TOC Initial Note (Addendum)
Transition of Care [facility]) - Initial/Assessment Note    Patient Details  Name: [name] [name] MRN: [id number] Date of Birth: 05/03/1961  [facility]) CM/SW Contact:    Darleene Cleaver, LCSW Phone Number: 03/30/2022, 8:29 PM  Clinical Narrative:                  CSW received consult that patient will need SNF placement.  CSW spoke to patient's wife, who agreed he does need SNF for rehab before returning back home.  CSW asked if he has ever been to SNF, and per wife he has not.  CSW discussed what to expect and the process for finding rehab facilities.  Patient's wife gave CSW permission to begin bed search in [facility].  Patient and wife would like [facility], [address], [place], [place], [address], [address], or [place].  Expected Discharge Plan: Skilled Nursing Facility Barriers to Discharge: Insurance Authorization, Continued Medical Work up   Patient Goals and CMS Choice Patient states their goals for this hospitalization and ongoing recovery are:: To go to SNF for rehab then return back home. CMS Medicare.gov Compare Post Acute Care list provided to:: Patient Represenative (must comment) Choice offered to / list presented to : Spouse  Expected Discharge Plan and Services Expected Discharge Plan: Skilled Nursing Facility       Living arrangements for the past 2 months: Single Family Home                                      Prior Living Arrangements/Services Living arrangements for the past 2 months: Single Family Home Lives with:: Spouse Patient language and need for interpreter reviewed:: Yes Do you feel safe going back to the place where you live?: No   Patient and family feel she needs rehab before returning back home.  Need for Family Participation in Patient Care: Yes (Comment) Care giver support system in place?: No (comment)   Criminal Activity/Legal Involvement Pertinent to Current  Situation/Hospitalization: No - Comment as needed  Activities of Daily Living Home Assistive Devices/Equipment: Walker (specify type) ADL Screening (condition at time of admission) Patient's cognitive ability adequate to safely complete daily activities?: Yes Is the patient deaf or have difficulty hearing?: No Does the patient have difficulty seeing, even when wearing glasses/contacts?: No Does the patient have difficulty concentrating, remembering, or making decisions?: No Patient able to express need for assistance with ADLs?: Yes Does the patient have difficulty dressing or bathing?: Yes Independently performs ADLs?: Yes (appropriate for developmental age) Does the patient have difficulty walking or climbing stairs?: Yes Weakness of Legs: Both Weakness of Arms/Hands: Both  Permission Sought/Granted Permission sought to share information with : Case Manager, Magazine features editor, Family Supports Permission granted to share information with : Yes, Verbal Permission Granted  Share Information with NAME: Ziyang, Orio [phone]  [phone]  Braylon, [place] Daughter [phone]  [phone]  Permission granted to share info w AGENCY: SNF admissions        Emotional Assessment Appearance:: Appears stated age Attitude/Demeanor/Rapport: Engaged Affect (typically observed): Stable, Accepting, Calm Orientation: : Oriented to Self, Oriented to Place, Oriented to  Time, Oriented to Situation Alcohol / Substance Use: Not Applicable Psych Involvement: No (comment)  Admission diagnosis:  Ureteral stone with hydronephrosis [N13.2] Sepsis (HCC) [A41.9] Fever, unspecified fever cause [R50.9] Pneumonia due to infectious organism, unspecified laterality, unspecified part of lung [J18.9] Patient Active  Problem List   Diagnosis Date Noted   Crohn's disease (HCC) 03/29/2022   Acute metabolic encephalopathy 03/29/2022   Thrombocytopenia (HCC) 03/29/2022   Ureteral  obstruction, left 03/28/2022   Hepatocellular carcinoma (HCC) 03/25/2022   Cirrhosis of liver without ascites (HCC)    Urinary tract infection without hematuria    Sepsis secondary to UTI (HCC) 03/17/2022   Hyperbilirubinemia 03/17/2022   Hydronephrosis of right kidney 03/17/2022   Nephrolith 03/17/2022   Protein-calorie malnutrition, severe 12/19/2021   Pathological fracture of left hip due to osteoporosis (HCC) 12/18/2021   Closed nondisplaced intertrochanteric fracture of left femur, initial encounter (HCC) 12/17/2021   Rib fracture 12/17/2021   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 05/11/2021   Liver cirrhosis secondary to NASH with liver mass     GERD (gastroesophageal reflux disease)    Pancytopenia (HCC)    Osteomyelitis of jaw 12/29/2020   Acute osteomyelitis of mandible 12/26/2020   Sepsis with acute end organ damage 12/04/2020   S/P inguinal hernia repair 01/14/2020   At high risk for falls    S/P shoulder surgery 03/05/2018   Excessive daytime sleepiness 02/18/2018   Crohn's disease of colon with rectal bleeding (HCC) 02/18/2018   Iron deficiency anemia due to chronic blood loss and thrombocytopenia  02/18/2018   Iron deficiency anemia due to sideropenic dysphagia 02/18/2018   Renal calculus, left 02/02/2018   History of sepsis 12/16/2017   Renal cyst, acquired, left 07/08/2017   Frequent falls 07/08/2017   Renal lesion 05/28/2017   MSSA bacteremia 05/06/2017   Depression 04/30/2016   Memory loss 04/30/2016   OSA on CPAP 10/31/2015   Nonepileptic episode (HCC) 04/25/2015   DOE (dyspnea on exertion) 03/12/2013   Complex partial seizures 04/19/2012   DM2 (diabetes mellitus, type 2) (HCC) 09/25/2011   OTHER DYSPHAGIA 03/27/2009   GERD 12/07/2007   EXTERNAL HEMORRHOIDS 09/11/2007   CROHN'S DISEASE, LARGE AND SMALL INTESTINES 09/11/2007   OSTEOPOROSIS 09/11/2007   PCP:  Farris Has, MD Pharmacy:   [facility] [phone] - [place], [place] - [address] AT [address] [facility] [address] [place] [address] Phone: [phone] Fax: [phone]     Social Determinants of Health (SDOH) Interventions Food Insecurity Interventions: Intervention Not Indicated Housing Interventions: Intervention Not Indicated Transportation Interventions: Intervention Not Indicated Utilities Interventions: Intervention Not Indicated  Readmission Risk Interventions     No data to display

## 2022-03-30 NOTE — Progress Notes (Signed)
  Progress Note   Patient: [name] [name] KVQ:[id number] DOB: 01-14-1961 DOA: 03/28/2022     2 DOS: the patient was seen and examined on 03/30/2022 at at 9:21AM      Brief hospital course: [name] [name] is a 61 y.o. M with Crohn's disease on mesalamine/methscopolamine, NASH cirrhosis well compensated, HCC and squamous skin cancer, OSA on CPAP, complex partial seizures, DM, esophageal stricture, mandibular osteomyelitis resolved, and history staph bacteremia who presented with UTI sepsis    Assessment and Plan: * Sepsis with acute end organ damage - Continue IV antibiotics - Follow culture data     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    OSA on CPAP - CPAP at night    Acute metabolic encephalopathy Improved to baseline     Crohn's disease (HCC) - Continue mesalamine and methscopolamine  Ureteral obstruction, left Recent bilateral hydronephrosis, now with left hydro.   S/P ureteral stent on left placed by Dr. Junious [name] 10/5 - Urology follow up  Hepatocellular carcinoma ([place]) - Continue Xifaxan   Complex partial seizures - Continue gabapentin and Lamictal          Subjective: No fever, abdominal pain, vomiting, respiratory distress.  Still slightly confused, but wife says this is his baseline.     Physical Exam: BP 122/67 (BP Location: Left Arm)   Pulse 79   Temp 98.3 F (36.8 C) (Oral)   Resp 18   Ht 5' 11"  (1.803 m)   Wt 69.6 kg   SpO2 97%   BMI 21.40 kg/m   Thin adult male, lying in bed, watching television, no acute distress, appears weak and debilitated RRR, no murmurs, no peripheral edema, respiratory rate normal, lungs clear without rales or wheezes, abdomen soft without tenderness palpation or guarding, no ascites or distention Attention normal, affect blunted, judgment insight appear mildly impaired, but he is oriented to place, day, and situation, just some mild memory impairment, speech fluent, moves upper extremities with generalized  weakness but symmetric strength.    Data Reviewed: Hemoglobin down to 8.9 Lactate normal Platelets 97,000, no change White blood cell count normal Comprehensive metabolic panel normal    Family Communication: Wife at the bedside    Disposition: Status is: Inpatient Patient mated with sepsis due to infected ureteral stone  The stone is been stented and he is stabilized  We will continue IV antibiotics monitor blood cultures.  Urine culture with no growth  I suspect by tomorrow he will be medically ready for discharge, PT recommends SNF due to his acute debility, unsteadiness with walking, but baseline independence        Author: Edwin Dada, MD 03/30/2022 2:51 PM  For on call review [email or web address].

## 2022-03-30 NOTE — Progress Notes (Signed)
Patient placed on auto cpap with full face mask and 2L of oxygen.  Patient tolerating well at this time.

## 2022-03-30 NOTE — Evaluation (Signed)
Physical Therapy Evaluation Patient Details Name: [name] [name] MRN: [id number] DOB: 26-Jul-1960 Today's Date: 03/30/2022  History of Present Illness  Pt is a 61 year old male admitted 03/28/22 for sepsis source likely infected/impacted ureteral stone.  Recent admission for sepsis secondary to UTI and s/p Cystoscopy, right retrograde pyelogram with intraoperative interpretation, right ureteroscopy on 03/18/22.  PMHx significant for but not limited to: recent IM nail due to L hip fx and rib fractures in July, seizures, Crohn's, anxiety, DM, liver cirrhosis, HTN, recurrent squamous cell carcinoma of the skin, s/p several Mohs surgeries and curative radiotherapy  Clinical Impression  Pt admitted with above diagnosis.  Pt currently with functional limitations due to the deficits listed below (see PT Problem List). Pt will benefit from skilled PT to increase their independence and safety with mobility to allow discharge to the venue listed below.  Pt requiring min assist occasionally for transfers and during ambulation with any challenge to balance.  Spouse reports pt has had a few falls at home, most recent was the Saturday prior to admission while pt was in the shower.  Spouse very worried about her ability to physically assist pt at home and also pt's bedroom is upstairs.  Recommend SNF upon d/c at this time however if home, pt would benefit from hospital bed and [facility] to ease burden of care and for safety of pt.        Recommendations for follow up therapy are one component of a multi-disciplinary discharge planning process, led by the attending physician.  Recommendations may be updated based on patient status, additional functional criteria and insurance authorization.  Follow Up Recommendations Skilled nursing-short term rehab (<3 hours/day) Can patient physically be transported by private vehicle: Yes    Assistance Recommended at Discharge Set up Supervision/Assistance  Patient can return home with  the following  A little help with walking and/or transfers;A little help with bathing/dressing/bathroom;Help with stairs or ramp for entrance    Equipment Recommendations BSC/3in1;Hospital bed  Recommendations for Other Services       Functional Status Assessment Patient has had a recent decline in their functional status and demonstrates the ability to make significant improvements in function in a reasonable and predictable amount of time.     Precautions / Restrictions Precautions Precautions: Fall      Mobility  Bed Mobility Overal bed mobility: Needs Assistance Bed Mobility: Supine to Sit     Supine to sit: Supervision, HOB elevated     General bed mobility comments: increased time, cue for technique    Transfers Overall transfer level: Needs assistance Equipment used: Rolling walker (2 wheels) Transfers: Sit to/from Stand Sit to Stand: Min guard, Min assist           General transfer comment: variable, occasionally min with posterior bias    Ambulation/Gait Ambulation/Gait assistance: Min assist, Min guard Gait Distance (Feet): 400 Feet Assistive device: Rolling walker (2 wheels) Gait Pattern/deviations: Step-through pattern, Decreased stride length, Narrow base of support Gait velocity: increased - cues to slow down     General Gait Details: min/guard for straight path; min assist for any challenges to balance; cues to keep RW on the ground  Stairs            Wheelchair Mobility    Modified Rankin (Stroke Patients Only)       Balance Overall balance assessment: History of Falls, Mild deficits observed, not formally tested, Needs assistance  High level balance activites: Backward walking, Direction changes, Sudden stops, Head turns High Level Balance Comments: light assist for head turns and sudden stops, min assist for walking backwards and direction change             Pertinent Vitals/Pain Pain  Assessment Pain Assessment: Faces Faces Pain Scale: Hurts little more Pain Location: big toe (pt has laceration from fall prior to admission) Pain Descriptors / Indicators: Sore Pain Intervention(s): Repositioned, Monitored during session    Home Living Family/patient expects to be discharged to:: Private residence Living Arrangements: Spouse/significant other Available Help at Discharge: Family;Friend(s);Available PRN/intermittently Type of Home: House Home Access: Stairs to enter Entrance Stairs-Rails: Left Entrance Stairs-Number of Steps: 2 Alternate Level Stairs-Number of Steps: flight Home Layout: Two level;Able to live on main level with bedroom/bathroom Home Equipment: Shower seat - built in;Grab bars - tub/shower;Wheelchair - Publishing copy (2 wheels);Cane - single point;Other (comment) Additional Comments: tub shower downstairs with built in bench, walk in shower upstairs    Prior Function Prior Level of Function : Needs assist       Physical Assist : Mobility (physical)     Mobility Comments: using rollator for safety due to frequent falls, was working with OPPT prior to admission ADLs Comments: spouse reports pt requiring at least supervision as he appears to sometimes need cues for processing, also had a fall in the shower the Saturday prior to admission     Hand Dominance        Extremity/Trunk Assessment        Lower Extremity Assessment Lower Extremity Assessment: Generalized weakness    Cervical / Trunk Assessment Cervical / Trunk Assessment: Normal  Communication   Communication: No difficulties  Cognition Arousal/Alertness: Awake/alert Behavior During Therapy: WFL for tasks assessed/performed Overall Cognitive Status: Impaired/Different from baseline Area of Impairment: Safety/judgement, Problem solving                         Safety/Judgement: Decreased awareness of safety, Decreased awareness of deficits   Problem Solving:  Slow processing, Difficulty sequencing, Requires verbal cues General Comments: pleasant, slow to process and requires cues,        General Comments      Exercises     Assessment/Plan    PT Assessment Patient needs continued PT services  PT Problem List Decreased strength;Decreased activity tolerance;Decreased balance;Decreased mobility;Decreased knowledge of precautions;Decreased safety awareness;Decreased knowledge of use of DME;Decreased cognition       PT Treatment Interventions Gait training;DME instruction;Therapeutic exercise;Functional mobility training;Therapeutic activities;Patient/family education;Balance training;Stair training;Neuromuscular re-education    PT Goals (Current goals can be found in the Care Plan section)  Acute Rehab PT Goals PT Goal Formulation: With patient/family Time For Goal Achievement: 04/13/22 Potential to Achieve Goals: Good    Frequency Min 3X/week     Co-evaluation               AM-PAC PT "6 Clicks" Mobility  Outcome Measure Help needed turning from your back to your side while in a flat bed without using bedrails?: A Little Help needed moving from lying on your back to sitting on the side of a flat bed without using bedrails?: A Little Help needed moving to and from a bed to a chair (including a wheelchair)?: A Little Help needed standing up from a chair using your arms (e.g., wheelchair or bedside chair)?: A Little Help needed to walk in hospital room?: A Little Help needed climbing 3-5 steps with a railing? :  A Lot 6 Click Score: 17    End of Session Equipment Utilized During Treatment: Gait belt Activity Tolerance: Patient tolerated treatment well Patient left: in chair;with call bell/phone within reach;with chair alarm set;with family/visitor present Nurse Communication: Mobility status PT Visit Diagnosis: Difficulty in walking, not elsewhere classified (R26.2);Unsteadiness on feet (R26.81);History of falling (Z91.81)     Time: 1240-1314 PT Time Calculation (min) (ACUTE ONLY): 34 min   Charges:   PT Evaluation $PT Eval Low Complexity: 1 Low PT Treatments $Gait Training: 8-22 mins      Jannette Spanner PT, DPT Physical Therapist Acute Rehabilitation Services Preferred contact method: Secure Chat Weekend Pager Only: [phone] Office: [place] 03/30/2022, 2:27 PM

## 2022-03-30 NOTE — NC FL2 (Signed)
[place] LEVEL OF CARE SCREENING TOOL     IDENTIFICATION  Patient Name: [name] [name] Birthdate: 24-Oct-1960 Sex: male Admission Date (Current Location): 03/28/2022  [facility] [place] Number:  Herbalist and Address:  [facility],  [place], [place]      Provider Number: [id number]  Attending Physician Name and Address:  Edwin Dada, *  Relative Name and Phone Number:  [name], [name] [phone]  [phone]  [name],[name] Daughter [phone]  [phone]    Current Level of Care: Hospital Recommended Level of Care: [place] Prior Approval Number:    Date Approved/Denied:   PASRR Number: Pending  Discharge Plan: SNF    Current Diagnoses: Patient Active Problem List   Diagnosis Date Noted   Crohn's disease ([place]) 04/17/8526   Acute metabolic encephalopathy 78/24/2353   Thrombocytopenia ([place]) 03/29/2022   Ureteral obstruction, left 03/28/2022   Hepatocellular carcinoma ([place]) 03/25/2022   Cirrhosis of liver without ascites ([place])    Urinary tract infection without hematuria    Sepsis secondary to UTI ([place]) 03/17/2022   Hyperbilirubinemia 03/17/2022   Hydronephrosis of right kidney 03/17/2022   Nephrolith 03/17/2022   Protein-calorie malnutrition, severe 12/19/2021   Pathological fracture of left hip due to osteoporosis ([place]) 12/18/2021   Closed nondisplaced intertrochanteric fracture of left femur, initial encounter ([place]) 12/17/2021   Rib fracture 12/17/2021   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 05/11/2021   Liver cirrhosis secondary to NASH with liver mass     GERD (gastroesophageal reflux disease)    Pancytopenia ([place])    Osteomyelitis of jaw 12/29/2020   Acute osteomyelitis of mandible 12/26/2020   Sepsis with acute end organ damage 12/04/2020   S/P inguinal hernia repair 01/14/2020   At high risk for falls    S/P shoulder surgery 03/05/2018   Excessive  daytime sleepiness 02/18/2018   Crohn's disease of colon with rectal bleeding ([place]) 02/18/2018   Iron deficiency anemia due to chronic blood loss and thrombocytopenia  02/18/2018   Iron deficiency anemia due to sideropenic dysphagia 02/18/2018   Renal calculus, left 02/02/2018   History of sepsis 12/16/2017   Renal cyst, acquired, left 07/08/2017   Frequent falls 07/08/2017   Renal lesion 05/28/2017   MSSA bacteremia 05/06/2017   Depression 04/30/2016   Memory loss 04/30/2016   OSA on CPAP 10/31/2015   Nonepileptic episode ([place]) 04/25/2015   DOE (dyspnea on exertion) 03/12/2013   Complex partial seizures 04/19/2012   DM2 (diabetes mellitus, type 2) ([place]) 09/25/2011   OTHER DYSPHAGIA 03/27/2009   GERD 12/07/2007   EXTERNAL HEMORRHOIDS 09/11/2007   CROHN'S DISEASE, LARGE AND SMALL INTESTINES 09/11/2007   OSTEOPOROSIS 09/11/2007    Orientation RESPIRATION BLADDER Height & Weight     Self, Time, Situation, Place  Normal Continent Weight: 153 lb 7 oz (69.6 kg) Height:  5' 11"  (180.3 cm)  BEHAVIORAL SYMPTOMS/MOOD NEUROLOGICAL BOWEL NUTRITION STATUS      Continent Diet  AMBULATORY STATUS COMMUNICATION OF NEEDS Skin   Limited Assist Verbally Normal                       Personal Care Assistance Level of Assistance  Bathing, Feeding, Dressing Bathing Assistance: Limited assistance Feeding assistance: Independent Dressing Assistance: Limited assistance     Functional Limitations Info  Sight, Hearing, Speech Sight Info: Adequate Hearing Info: Adequate Speech Info: Adequate    SPECIAL CARE FACTORS FREQUENCY  PT (By licensed PT), OT (  By licensed OT)     PT Frequency: [place] 5x a week OT Frequency: Minimum 5x a week            Contractures Contractures Info: Not present    Additional Factors Info  Code Status, Allergies, Psychotropic Code Status Info: Full Code Allergies Info: Sulfonamide Derivatives   Z-pak (Azithromycin)   Dilaudid (Hydromorphone Hcl)   Sulfa  Antibiotics Psychotropic Info: sertraline (ZOLOFT) tablet 200 mg         Current Medications (03/30/2022):  This is the current hospital active medication list Current Facility-Administered Medications  Medication Dose Route Frequency Provider Last Rate Last Admin   acetaminophen (TYLENOL) tablet 1,000 mg  1,000 mg Oral QHS Tu, Ching T, DO   1,000 mg at 03/29/22 2142   acitretin ([name]) capsule 25 mg  25 mg Oral Daily Tu, Ching T, DO   25 mg at 03/30/22 1137   ceFEPIme (MAXIPIME) 2 g in sodium chloride 0.9 % 100 mL IVPB  2 g Intravenous Q8H Luiz Ochoa, RPH 200 mL/hr at 03/30/22 1514 2 g at 03/30/22 1514   feeding supplement (ENSURE ENLIVE / ENSURE PLUS) liquid 237 mL  237 mL Oral BID BM Tu, Ching T, DO   237 mL at 03/30/22 0957   fesoterodine (TOVIAZ) tablet 4 mg  4 mg Oral Daily Tu, Ching T, DO   4 mg at 62/94/76 5465   folic acid (FOLVITE) tablet 2 mg  2 mg Oral Daily Tu, Ching T, DO   2 mg at 03/30/22 0955   gabapentin (NEURONTIN) capsule 300 mg  300 mg Oral BID Tu, Ching T, DO   300 mg at 03/30/22 0957   lamoTRIgine (LAMICTAL) tablet 150 mg  150 mg Oral BID Tu, Ching T, DO   150 mg at 03/30/22 0957   mesalamine (LIALDA) EC tablet 2.4 g  2.4 g Oral Daily Tu, Ching T, DO   2.4 g at 03/30/22 0354   methscopolamine (PAMINE FORTE) tablet 5 mg  5 mg Oral BID Danford, Suann Larry, MD       oxyCODONE (Oxy IR/ROXICODONE) immediate release tablet 5-10 mg  5-10 mg Oral Daily PRN Edwin Dada, MD   5 mg at 03/29/22 2204   pantoprazole (PROTONIX) EC tablet 80 mg  80 mg Oral Q1200 Tu, Ching T, DO   80 mg at 03/30/22 1136   potassium chloride SA (KLOR-CON M) CR tablet 20 mEq  20 mEq Oral BID Tu, Ching T, DO   20 mEq at 03/30/22 0957   rifaximin (XIFAXAN) tablet 550 mg  550 mg Oral BID Tu, Ching T, DO   550 mg at 03/30/22 0957   sertraline (ZOLOFT) tablet 200 mg  200 mg Oral Q breakfast Tu, Ching T, DO   200 mg at 03/30/22 0830   tamsulosin (FLOMAX) capsule 0.4 mg  0.4 mg Oral QHS Tu,  Ching T, DO   0.4 mg at 03/29/22 2142   vancomycin (VANCOCIN) IVPB 1000 mg/200 mL premix  1,000 mg Intravenous Q12H Gadhia, Jigna M, RPH 200 mL/hr at 03/30/22 1005 1,000 mg at 03/30/22 1005   Facility-Administered Medications Ordered in Other Encounters  Medication Dose Route Frequency Provider Last Rate Last Admin   gadopentetate dimeglumine (MAGNEVIST) injection 20 mL  20 mL Intravenous Once PRN Marcial Pacas, MD         Discharge Medications: Please see discharge summary for a list of discharge medications.  Relevant Imaging Results:  Relevant Lab Results:   Additional Information SSN  [id number]  Ross Ludwig, LCSW

## 2022-03-31 DIAGNOSIS — A419 Sepsis, unspecified organism: Secondary | ICD-10-CM | POA: Diagnosis not present

## 2022-03-31 DIAGNOSIS — K50919 Crohn's disease, unspecified, with unspecified complications: Secondary | ICD-10-CM | POA: Diagnosis not present

## 2022-03-31 DIAGNOSIS — G9341 Metabolic encephalopathy: Secondary | ICD-10-CM | POA: Diagnosis not present

## 2022-03-31 DIAGNOSIS — G40209 Localization-related (focal) (partial) symptomatic epilepsy and epileptic syndromes with complex partial seizures, not intractable, without status epilepticus: Secondary | ICD-10-CM | POA: Diagnosis not present

## 2022-03-31 LAB — CBC
HCT: 27.8 % — ABNORMAL LOW (ref 39.0–52.0)
Hemoglobin: 8.8 g/dL — ABNORMAL LOW (ref 13.0–17.0)
MCH: 29.5 pg (ref 26.0–34.0)
MCHC: 31.7 g/dL (ref 30.0–36.0)
MCV: 93.3 fL (ref 80.0–100.0)
Platelets: 109 10*3/uL — ABNORMAL LOW (ref 150–400)
RBC: 2.98 MIL/uL — ABNORMAL LOW (ref 4.22–5.81)
RDW: 15.5 % (ref 11.5–15.5)
WBC: 4.8 10*3/uL (ref 4.0–10.5)
nRBC: 0 % (ref 0.0–0.2)

## 2022-03-31 MED ORDER — GABAPENTIN 300 MG PO CAPS
600.0000 mg | ORAL_CAPSULE | Freq: Every day | ORAL | Status: DC
  Administered 2022-03-31: 600 mg via ORAL
  Filled 2022-03-31: qty 2

## 2022-03-31 MED ORDER — GABAPENTIN 300 MG PO CAPS
300.0000 mg | ORAL_CAPSULE | Freq: Every day | ORAL | Status: DC
  Administered 2022-04-01: 300 mg via ORAL
  Filled 2022-03-31: qty 1

## 2022-03-31 MED ORDER — CEFDINIR 300 MG PO CAPS
300.0000 mg | ORAL_CAPSULE | Freq: Two times a day (BID) | ORAL | Status: DC
  Administered 2022-03-31 – 2022-04-01 (×2): 300 mg via ORAL
  Filled 2022-03-31 (×3): qty 1

## 2022-03-31 NOTE — Progress Notes (Signed)
  Progress Note   Patient: [name] [name] HFW:[id number] DOB: 04-14-1961 DOA: 03/28/2022     3 DOS: the patient was seen and examined on 03/31/2022 at at 9:21AM      Brief hospital course: [name] [name] is a 61 y.o. M with Crohn's disease on mesalamine/methscopolamine, NASH cirrhosis well compensated, HCC and squamous skin cancer, OSA on CPAP, complex partial seizures, DM, esophageal stricture, mandibular osteomyelitis resolved, and history staph bacteremia who presented with UTI sepsis    Assessment and Plan: * Sepsis with acute end organ damage Cultures all negative.  We will transition to cefdinir.  Given stent in place, recurrence of fever after stopping antibiotics last time, will plan for 3 weeks antibiotics until stent is removed - Transition to cefdinir       OSA on CPAP - CPAP at night      Crohn's disease (HCC) - Continue mesalamine and methscopolamine  Ureteral obstruction, left Recent bilateral hydronephrosis, now with left hydro.   S/P ureteral stent on left placed by [name] [name] 10/5 - Urology follow up  Hepatocellular carcinoma ([place]) - Continue Xifaxan   Complex partial seizures - Continue gabapentin and Lamictal          Subjective: No new complaints, no fever, appetite okay, no nursing concerns     Physical Exam: BP 114/66 (BP Location: Right Arm)   Pulse 78   Temp 98.2 F (36.8 C) (Oral)   Resp 17   Ht 5' 11"  (1.803 m)   Wt 69.6 kg   SpO2 96%   BMI 21.40 kg/m   Thin adult male, lying in bed, appears chronically ill RRR, no murmurs, no peripheral edema Respiratory rate normal, lungs clear without rales or wheezes    Data Reviewed: Hemoglobin 8.8, no change, otherwise hemogram shows platelets up to 109, white blood cell count normal    Family Communication: None present    Disposition: Status is: Inpatient Patient was admitted with sepsis due to infected ureteral stone  He is stented and stabilized, able to transition to oral  antibiotics, medically ready for discharge when bed available        Author: Edwin Dada, MD 03/31/2022 5:08 PM  For on call review [email or web address].

## 2022-03-31 NOTE — TOC [facility] Note (Addendum)
[place] Note   Patient Details  Name: [name] [name] Date of Birth: 1960-11-18   Transition of Care [facility]) CM/SW Contact:    Ross Ludwig, LCSW Phone Number: 03/31/2022, 6:04 PM  To Whom It May Concern:  Please be advised that this patient will require a short-term nursing home stay - anticipated 30 days or less for rehabilitation and strengthening.   The plan is for return home.

## 2022-03-31 NOTE — TOC Progression Note (Addendum)
Transition of Care [facility]) - Progression Note    Patient Details  Name: [name] [name] MRN: [id number] Date of Birth: 06-Feb-1961  Transition of Care [facility]) CM/SW Contact  Ross Ludwig, [place] Phone Number: 03/31/2022, 10:49 AM  Clinical Narrative:     [facility] 2 [facility] information has been uploaded to [facility], waiting for [facility] number and bed offers for SNF.  CSW sent message to requested SNFs waiting for them to review patient to see if they can offer a bed.  3:45pm  CSW received phone call from patient's wife asking for an update on bed offers.  Patient only has one bed offer so far, CSW provided bed offer, she would like to wait to see what other options are available.  CSW informed her that someone will follow up tomorrow with bed offers.  CSW to continue to follow patient's progress throughout discharge planning.   Expected Discharge Plan: Skilled Nursing Facility Barriers to Discharge: Ship broker, Continued Medical Work up  Expected Discharge Plan and Services Expected Discharge Plan: [place]       Living arrangements for the past 2 months: Single Family Home                                       Social Determinants of Health (SDOH) Interventions Food Insecurity Interventions: Intervention Not Indicated Housing Interventions: Intervention Not Indicated Transportation Interventions: Intervention Not Indicated Utilities Interventions: Intervention Not Indicated  Readmission Risk Interventions     No data to display

## 2022-04-01 ENCOUNTER — Encounter: Payer: Self-pay | Admitting: General Practice

## 2022-04-01 ENCOUNTER — Encounter: Payer: Self-pay | Admitting: Neurology

## 2022-04-01 ENCOUNTER — Ambulatory Visit: Payer: Medicare Other | Admitting: Physical Therapy

## 2022-04-01 DIAGNOSIS — R52 Pain, unspecified: Secondary | ICD-10-CM | POA: Diagnosis not present

## 2022-04-01 DIAGNOSIS — R161 Splenomegaly, not elsewhere classified: Secondary | ICD-10-CM | POA: Diagnosis not present

## 2022-04-01 DIAGNOSIS — G9340 Encephalopathy, unspecified: Secondary | ICD-10-CM | POA: Diagnosis not present

## 2022-04-01 DIAGNOSIS — K766 Portal hypertension: Secondary | ICD-10-CM | POA: Diagnosis not present

## 2022-04-01 DIAGNOSIS — E46 Unspecified protein-calorie malnutrition: Secondary | ICD-10-CM | POA: Diagnosis not present

## 2022-04-01 DIAGNOSIS — Z792 Long term (current) use of antibiotics: Secondary | ICD-10-CM | POA: Diagnosis not present

## 2022-04-01 DIAGNOSIS — R030 Elevated blood-pressure reading, without diagnosis of hypertension: Secondary | ICD-10-CM | POA: Diagnosis not present

## 2022-04-01 DIAGNOSIS — R4182 Altered mental status, unspecified: Secondary | ICD-10-CM | POA: Diagnosis not present

## 2022-04-01 DIAGNOSIS — H35033 Hypertensive retinopathy, bilateral: Secondary | ICD-10-CM | POA: Diagnosis not present

## 2022-04-01 DIAGNOSIS — S0003XA Contusion of scalp, initial encounter: Secondary | ICD-10-CM | POA: Diagnosis not present

## 2022-04-01 DIAGNOSIS — S32000A Wedge compression fracture of unspecified lumbar vertebra, initial encounter for closed fracture: Secondary | ICD-10-CM | POA: Diagnosis not present

## 2022-04-01 DIAGNOSIS — R1310 Dysphagia, unspecified: Secondary | ICD-10-CM | POA: Diagnosis not present

## 2022-04-01 DIAGNOSIS — L03031 Cellulitis of right toe: Secondary | ICD-10-CM | POA: Diagnosis not present

## 2022-04-01 DIAGNOSIS — Z9181 History of falling: Secondary | ICD-10-CM | POA: Diagnosis not present

## 2022-04-01 DIAGNOSIS — N130 Hydronephrosis with ureteropelvic junction obstruction: Secondary | ICD-10-CM | POA: Diagnosis not present

## 2022-04-01 DIAGNOSIS — I10 Essential (primary) hypertension: Secondary | ICD-10-CM | POA: Diagnosis not present

## 2022-04-01 DIAGNOSIS — R55 Syncope and collapse: Secondary | ICD-10-CM | POA: Diagnosis not present

## 2022-04-01 DIAGNOSIS — K5090 Crohn's disease, unspecified, without complications: Secondary | ICD-10-CM | POA: Diagnosis not present

## 2022-04-01 DIAGNOSIS — W19XXXA Unspecified fall, initial encounter: Secondary | ICD-10-CM | POA: Diagnosis not present

## 2022-04-01 DIAGNOSIS — R5383 Other fatigue: Secondary | ICD-10-CM | POA: Diagnosis not present

## 2022-04-01 DIAGNOSIS — Z7401 Bed confinement status: Secondary | ICD-10-CM | POA: Diagnosis not present

## 2022-04-01 DIAGNOSIS — M542 Cervicalgia: Secondary | ICD-10-CM | POA: Diagnosis not present

## 2022-04-01 DIAGNOSIS — G9341 Metabolic encephalopathy: Secondary | ICD-10-CM | POA: Diagnosis not present

## 2022-04-01 DIAGNOSIS — M25552 Pain in left hip: Secondary | ICD-10-CM | POA: Diagnosis not present

## 2022-04-01 DIAGNOSIS — R41841 Cognitive communication deficit: Secondary | ICD-10-CM | POA: Diagnosis not present

## 2022-04-01 DIAGNOSIS — S79912A Unspecified injury of left hip, initial encounter: Secondary | ICD-10-CM | POA: Diagnosis not present

## 2022-04-01 DIAGNOSIS — E119 Type 2 diabetes mellitus without complications: Secondary | ICD-10-CM | POA: Diagnosis not present

## 2022-04-01 DIAGNOSIS — T68XXXA Hypothermia, initial encounter: Secondary | ICD-10-CM | POA: Diagnosis not present

## 2022-04-01 DIAGNOSIS — R159 Full incontinence of feces: Secondary | ICD-10-CM | POA: Diagnosis not present

## 2022-04-01 DIAGNOSIS — F4323 Adjustment disorder with mixed anxiety and depressed mood: Secondary | ICD-10-CM | POA: Diagnosis not present

## 2022-04-01 DIAGNOSIS — Z960 Presence of urogenital implants: Secondary | ICD-10-CM | POA: Diagnosis not present

## 2022-04-01 DIAGNOSIS — R319 Hematuria, unspecified: Secondary | ICD-10-CM | POA: Diagnosis not present

## 2022-04-01 DIAGNOSIS — M810 Age-related osteoporosis without current pathological fracture: Secondary | ICD-10-CM | POA: Diagnosis not present

## 2022-04-01 DIAGNOSIS — D696 Thrombocytopenia, unspecified: Secondary | ICD-10-CM | POA: Diagnosis not present

## 2022-04-01 DIAGNOSIS — L271 Localized skin eruption due to drugs and medicaments taken internally: Secondary | ICD-10-CM | POA: Diagnosis not present

## 2022-04-01 DIAGNOSIS — J189 Pneumonia, unspecified organism: Secondary | ICD-10-CM | POA: Diagnosis not present

## 2022-04-01 DIAGNOSIS — C801 Malignant (primary) neoplasm, unspecified: Secondary | ICD-10-CM | POA: Diagnosis not present

## 2022-04-01 DIAGNOSIS — C220 Liver cell carcinoma: Secondary | ICD-10-CM | POA: Diagnosis not present

## 2022-04-01 DIAGNOSIS — Z87448 Personal history of other diseases of urinary system: Secondary | ICD-10-CM | POA: Diagnosis not present

## 2022-04-01 DIAGNOSIS — S0990XA Unspecified injury of head, initial encounter: Secondary | ICD-10-CM | POA: Diagnosis not present

## 2022-04-01 DIAGNOSIS — D61818 Other pancytopenia: Secondary | ICD-10-CM | POA: Diagnosis not present

## 2022-04-01 DIAGNOSIS — R8271 Bacteriuria: Secondary | ICD-10-CM | POA: Diagnosis not present

## 2022-04-01 DIAGNOSIS — N201 Calculus of ureter: Secondary | ICD-10-CM | POA: Diagnosis not present

## 2022-04-01 DIAGNOSIS — H409 Unspecified glaucoma: Secondary | ICD-10-CM | POA: Diagnosis not present

## 2022-04-01 DIAGNOSIS — D649 Anemia, unspecified: Secondary | ICD-10-CM | POA: Diagnosis not present

## 2022-04-01 DIAGNOSIS — T1490XA Injury, unspecified, initial encounter: Secondary | ICD-10-CM | POA: Diagnosis not present

## 2022-04-01 DIAGNOSIS — N135 Crossing vessel and stricture of ureter without hydronephrosis: Secondary | ICD-10-CM | POA: Diagnosis not present

## 2022-04-01 DIAGNOSIS — S72142D Displaced intertrochanteric fracture of left femur, subsequent encounter for closed fracture with routine healing: Secondary | ICD-10-CM | POA: Diagnosis not present

## 2022-04-01 DIAGNOSIS — R531 Weakness: Secondary | ICD-10-CM | POA: Diagnosis not present

## 2022-04-01 DIAGNOSIS — F32A Depression, unspecified: Secondary | ICD-10-CM | POA: Diagnosis not present

## 2022-04-01 DIAGNOSIS — G4733 Obstructive sleep apnea (adult) (pediatric): Secondary | ICD-10-CM | POA: Diagnosis not present

## 2022-04-01 DIAGNOSIS — H5462 Unqualified visual loss, left eye, normal vision right eye: Secondary | ICD-10-CM | POA: Diagnosis not present

## 2022-04-01 DIAGNOSIS — R400 Somnolence: Secondary | ICD-10-CM | POA: Diagnosis not present

## 2022-04-01 DIAGNOSIS — G40909 Epilepsy, unspecified, not intractable, without status epilepticus: Secondary | ICD-10-CM | POA: Diagnosis not present

## 2022-04-01 DIAGNOSIS — R2689 Other abnormalities of gait and mobility: Secondary | ICD-10-CM | POA: Diagnosis not present

## 2022-04-01 DIAGNOSIS — R5381 Other malaise: Secondary | ICD-10-CM | POA: Diagnosis not present

## 2022-04-01 DIAGNOSIS — I700 Atherosclerosis of aorta: Secondary | ICD-10-CM | POA: Diagnosis not present

## 2022-04-01 DIAGNOSIS — M4696 Unspecified inflammatory spondylopathy, lumbar region: Secondary | ICD-10-CM | POA: Diagnosis not present

## 2022-04-01 DIAGNOSIS — L03032 Cellulitis of left toe: Secondary | ICD-10-CM | POA: Diagnosis not present

## 2022-04-01 DIAGNOSIS — K7030 Alcoholic cirrhosis of liver without ascites: Secondary | ICD-10-CM | POA: Diagnosis not present

## 2022-04-01 DIAGNOSIS — R262 Difficulty in walking, not elsewhere classified: Secondary | ICD-10-CM | POA: Diagnosis not present

## 2022-04-01 DIAGNOSIS — Z515 Encounter for palliative care: Secondary | ICD-10-CM | POA: Diagnosis not present

## 2022-04-01 DIAGNOSIS — R1313 Dysphagia, pharyngeal phase: Secondary | ICD-10-CM | POA: Diagnosis not present

## 2022-04-01 DIAGNOSIS — M1612 Unilateral primary osteoarthritis, left hip: Secondary | ICD-10-CM | POA: Diagnosis not present

## 2022-04-01 DIAGNOSIS — R1110 Vomiting, unspecified: Secondary | ICD-10-CM | POA: Diagnosis not present

## 2022-04-01 DIAGNOSIS — H401234 Low-tension glaucoma, bilateral, indeterminate stage: Secondary | ICD-10-CM | POA: Diagnosis not present

## 2022-04-01 DIAGNOSIS — M4856XA Collapsed vertebra, not elsewhere classified, lumbar region, initial encounter for fracture: Secondary | ICD-10-CM | POA: Diagnosis not present

## 2022-04-01 DIAGNOSIS — E43 Unspecified severe protein-calorie malnutrition: Secondary | ICD-10-CM | POA: Diagnosis not present

## 2022-04-01 DIAGNOSIS — N390 Urinary tract infection, site not specified: Secondary | ICD-10-CM | POA: Diagnosis not present

## 2022-04-01 DIAGNOSIS — B370 Candidal stomatitis: Secondary | ICD-10-CM | POA: Diagnosis not present

## 2022-04-01 DIAGNOSIS — Z741 Need for assistance with personal care: Secondary | ICD-10-CM | POA: Diagnosis not present

## 2022-04-01 DIAGNOSIS — R6520 Severe sepsis without septic shock: Secondary | ICD-10-CM | POA: Diagnosis not present

## 2022-04-01 DIAGNOSIS — Z882 Allergy status to sulfonamides status: Secondary | ICD-10-CM | POA: Diagnosis not present

## 2022-04-01 DIAGNOSIS — D099 Carcinoma in situ, unspecified: Secondary | ICD-10-CM | POA: Diagnosis not present

## 2022-04-01 DIAGNOSIS — N1330 Unspecified hydronephrosis: Secondary | ICD-10-CM | POA: Diagnosis not present

## 2022-04-01 DIAGNOSIS — F5101 Primary insomnia: Secondary | ICD-10-CM | POA: Diagnosis not present

## 2022-04-01 DIAGNOSIS — R1311 Dysphagia, oral phase: Secondary | ICD-10-CM | POA: Diagnosis not present

## 2022-04-01 DIAGNOSIS — Z09 Encounter for follow-up examination after completed treatment for conditions other than malignant neoplasm: Secondary | ICD-10-CM | POA: Diagnosis not present

## 2022-04-01 DIAGNOSIS — A419 Sepsis, unspecified organism: Secondary | ICD-10-CM | POA: Diagnosis not present

## 2022-04-01 DIAGNOSIS — K7581 Nonalcoholic steatohepatitis (NASH): Secondary | ICD-10-CM | POA: Diagnosis not present

## 2022-04-01 DIAGNOSIS — C4492 Squamous cell carcinoma of skin, unspecified: Secondary | ICD-10-CM | POA: Diagnosis not present

## 2022-04-01 DIAGNOSIS — K769 Liver disease, unspecified: Secondary | ICD-10-CM | POA: Diagnosis not present

## 2022-04-01 DIAGNOSIS — M545 Low back pain, unspecified: Secondary | ICD-10-CM | POA: Diagnosis not present

## 2022-04-01 DIAGNOSIS — S0083XA Contusion of other part of head, initial encounter: Secondary | ICD-10-CM | POA: Diagnosis not present

## 2022-04-01 DIAGNOSIS — M6281 Muscle weakness (generalized): Secondary | ICD-10-CM | POA: Diagnosis not present

## 2022-04-01 DIAGNOSIS — Z85828 Personal history of other malignant neoplasm of skin: Secondary | ICD-10-CM | POA: Diagnosis not present

## 2022-04-01 DIAGNOSIS — Y92129 Unspecified place in nursing home as the place of occurrence of the external cause: Secondary | ICD-10-CM | POA: Diagnosis not present

## 2022-04-01 DIAGNOSIS — M6259 Muscle wasting and atrophy, not elsewhere classified, multiple sites: Secondary | ICD-10-CM | POA: Diagnosis not present

## 2022-04-01 DIAGNOSIS — M4810 Ankylosing hyperostosis [Forestier], site unspecified: Secondary | ICD-10-CM | POA: Diagnosis not present

## 2022-04-01 DIAGNOSIS — R269 Unspecified abnormalities of gait and mobility: Secondary | ICD-10-CM | POA: Diagnosis not present

## 2022-04-01 DIAGNOSIS — N202 Calculus of kidney with calculus of ureter: Secondary | ICD-10-CM | POA: Diagnosis not present

## 2022-04-01 DIAGNOSIS — S90222A Contusion of left lesser toe(s) with damage to nail, initial encounter: Secondary | ICD-10-CM | POA: Diagnosis not present

## 2022-04-01 DIAGNOSIS — S32030A Wedge compression fracture of third lumbar vertebra, initial encounter for closed fracture: Secondary | ICD-10-CM | POA: Diagnosis not present

## 2022-04-01 DIAGNOSIS — K7460 Unspecified cirrhosis of liver: Secondary | ICD-10-CM | POA: Diagnosis not present

## 2022-04-01 DIAGNOSIS — R748 Abnormal levels of other serum enzymes: Secondary | ICD-10-CM | POA: Diagnosis not present

## 2022-04-01 MED ORDER — CEFDINIR 300 MG PO CAPS: 300.0000 mg | ORAL_CAPSULE | Freq: Two times a day (BID) | ORAL | 0 refills | Status: AC

## 2022-04-01 MED ORDER — ORAL CARE MOUTH RINSE: 15.0000 mL | OROMUCOSAL | Status: DC | PRN

## 2022-04-01 NOTE — Discharge Summary (Signed)
Physician Discharge Summary  [name] [name] XBJ:[id number] DOB: [birth year] DOA: 03/28/2022  PCP: [name], MD  Admit date: 03/28/2022  Discharge date: 04/01/2022  Admitted From: Home.  Disposition:  SNF ( [facility])  Recommendations for Outpatient Follow-up:  Follow up with PCP in 1-2 weeks. Please obtain BMP/CBC in one week. Advised to follow-up with urology as scheduled. Advised to take omnicef 300 mg twice daily for 10 days. Patient underwent successful ureteral stent by urology.  Home Health: None Equipment/Devices:Left ureteral Stent  Discharge Condition: Stable CODE STATUS:Full code Diet recommendation: Heart Healthy  Brief Summary / Hospital Course: This 61 years old male with PMH significant for Crohn's disease on mesalamine/methscopolamine, NASH cirrhosis,  well compensated, hepatocellular carcinoma , Squamous cell skin cancer, OSA on CPAP, complex partial seizures, diabetes mellitus, esophageal stricture, mandibular osteomyelitis which is now resolved and history of staph bacteremia presented to the ED with sepsis secondary to UTI.  Patient was admitted for further evaluation.  Patient was started on empiric antibiotics (vancomycin and cefepime ).  CT abdomen and pelvis shows left-sided obstructing calculus with moderate left-sided hydronephrosis.  Urology was consulted,  Patient underwent successful cystoscopy with left ureteral stent on 03/28/2022.  Blood and urine cultures all negative.  Patient feels better,  Urology signed off with outpatient urology follow-up.  Patient feels better.  PT has recommended skilled nursing facility for rehab.  Patient is being discharged  on [place] for 10 more days to complete 14-day treatment for UTI.  Discharge Diagnoses:  Principal Problem:   Sepsis with acute end organ damage Active Problems:   Iron deficiency anemia due to chronic blood loss and thrombocytopenia    DM2 (diabetes mellitus, type 2) (HCC)   Liver cirrhosis secondary  to NASH with liver mass    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   OSA on CPAP   Complex partial seizures   Hepatocellular carcinoma (HCC)   Ureteral obstruction, left   Crohn's disease ([place])   Acute metabolic encephalopathy   Thrombocytopenia (HCC)   Severe sepsis secondary to UTI: Patient presented with severe sepsis (fever, tachycardia, tachypnea, lactic acid 2.6, leukocytosis, UA consistent with UTI). Patient was empirically started on antibiotics (vancomycin and cefepime) Continued on IV hydration.  Lactic acid normalized. CT abdomen and pelvis shows left-sided obstructing calculus with moderate left hydronephrosis Urology was consulted, Patient underwent ureteral stent on left by Dr. Junious Silk on 10/5. Cultures all negative.  Will change antibiotics to cefdinir on discharge. Given his stent in place recurrence of fever after stopping antibiotics last time we will plan for 3 weeks of antibiotic until his stent is removed. Sepsis physiology resolved.  Antibiotics changed to cefdinir.   OSA: Continue CPAP at night.   Crohn's disease: Continue mesalamine and methscopolamine.   Ureteral obstruction left: S/p ureteral stent on left placed by Dr. Junious Silk 10/5. Cultures all negative.  Continue cefdinir.   Hepatocellular carcinoma Continue Xifaxin   Complex partial seizure : Continue gabapentin and Lamictal  Discharge Instructions  Discharge Instructions     Call MD for:  difficulty breathing, headache or visual disturbances   Complete by: As directed    Call MD for:  persistant dizziness or light-headedness   Complete by: As directed    Call MD for:  persistant nausea and vomiting   Complete by: As directed    Diet - low sodium heart healthy   Complete by: As directed    Diet Carb Modified   Complete by: As directed  Discharge instructions   Complete by: As directed    Advised to follow-up with primary care physician in 1 week. Advised to follow-up with  urology as scheduled. Advised to take omnicef 300 mg twice daily for 10 days. Patient underwent successful ureteral stent by urology.   Increase activity slowly   Complete by: As directed       Allergies as of 04/01/2022       Reactions   Sulfonamide Derivatives Hives, Rash   Z-pak [azithromycin] Swelling   Dilaudid [hydromorphone Hcl] Nausea And Vomiting   Sulfa Antibiotics Hives, Rash        Medication List     STOP taking these medications    tamsulosin 0.4 MG Caps capsule Commonly known as: FLOMAX       TAKE these medications    acetaminophen 500 MG tablet Commonly known as: TYLENOL Take 2 tablets (1,000 mg total) by mouth 3 (three) times daily. What changed: when to take this   acitretin 25 MG capsule Commonly known as: [name] Take 25 mg by mouth daily.   CALCIUM PO Take 1 tablet by mouth 2 (two) times daily.   cefdinir 300 MG capsule Commonly known as: OMNICEF Take 1 capsule (300 mg total) by mouth every 12 (twelve) hours for 10 days.   chlorhexidine 0.12 % solution Commonly known as: PERIDEX Use as directed 15 mLs in the mouth or throat 2 (two) times daily.   cycloSPORINE 0.05 % ophthalmic emulsion Commonly known as: RESTASIS Place 1 drop into both eyes 2 (two) times daily as needed (dry eyes).   DAIRY RELIEF PO Take 1 tablet by mouth in the morning and at bedtime.   desonide 0.05 % ointment Commonly known as: DESOWEN Apply 1 Application topically See admin instructions. Apply to affected area of lower lip 3 times a day as needed for irritation- after mixing with Mupirocin 2% ointment   esomeprazole 40 MG capsule Commonly known as: NEXIUM Take 1 capsule (40 mg total) by mouth 2 (two) times daily before a meal.   Eszopiclone 3 MG Tabs Take 3 mg by mouth at bedtime.   feeding supplement Liqd Take 237 mLs by mouth 2 (two) times daily between meals.   folic acid 1 MG tablet Commonly known as: FOLVITE Take 2 tablets (2 mg total) by mouth  daily.   gabapentin 300 MG capsule Commonly known as: NEURONTIN Take 300-600 mg by mouth See admin instructions. Taking 300 mg in the morning and 2 capsules (600 mg) at bedtime   hydrocortisone 2.5 % cream Apply 1 Application topically 2 (two) times daily as needed (Itchy skin).   lamoTRIgine 150 MG tablet Commonly known as: LaMICtal Take 1 tablet (150 mg total) by mouth 2 (two) times daily.   latanoprost 0.005 % ophthalmic solution Commonly known as: XALATAN Place 1 drop into both eyes at bedtime.   Libtayo 350 MG/7ML Soln injection Generic drug: cemiplimab-rwlc Inject into the vein See admin instructions. Every 4-6 weeks   mesalamine 1.2 g EC tablet Commonly known as: LIALDA Take 2 tablets (2.4 g total) by mouth 2 (two) times daily. What changed: when to take this   methscopolamine 5 MG tablet Commonly known as: PAMINE FORTE Take 1 tablet (5 mg total) by mouth 2 (two) times daily.   mupirocin ointment 2 % Commonly known as: BACTROBAN Apply 1 Application topically See admin instructions. Apply to affected area of lower lip three times a day as needed for irritation- after mixing with Desonide 0.05% ointment  Myrbetriq 50 MG Tb24 tablet Generic drug: mirabegron ER Take 50 mg by mouth at bedtime.   oxyCODONE 5 MG immediate release tablet Commonly known as: Oxy IR/ROXICODONE Take 1 tablet (5 mg total) by mouth daily as needed for moderate pain or severe pain. What changed: how much to take   potassium chloride SA 20 MEQ tablet Commonly known as: KLOR-CON M Take 1 tablet (20 mEq total) by mouth 2 (two) times daily.   PROBIOTIC PO Take 1 tablet at bedtime by mouth.   rifaximin 550 MG Tabs tablet Commonly known as: Xifaxan Take 1 tablet (550 mg total) by mouth 2 (two) times daily.   sertraline 100 MG tablet Commonly known as: ZOLOFT Take 200 mg daily with breakfast by mouth.   tolterodine 4 MG 24 hr capsule Commonly known as: DETROL LA Take 4 mg by mouth every  evening.   traZODone 150 MG tablet Commonly known as: DESYREL Take 300 mg by mouth at bedtime.   VITAMIN D-3 PO Take 5,000 Units by mouth daily.               Durable Medical Equipment  (From admission, onward)           Start     Ordered   03/30/22 1432  For home use only DME Hospital bed  Once       Question Answer Comment  Length of Need Lifetime   Patient has (list medical condition): Hepatocellular carcinoma   Bed type Semi-electric      03/30/22 1435   03/30/22 1431  For home use only DME 3 n 1  Once       Comments: Patient is not able to walk the distance required to go the bathroom, or he/she is unable to safely negotiate stairs required to access the bathroom.  A 3in1 BSC will alleviate this problem   03/30/22 1435            Contact information for follow-up providers     Remi Haggard, MD Follow up.   Specialty: Urology Contact information: [address] [place] [address] [phone]         [name], MD Follow up in 1 week(s).   Specialty: Family Medicine Contact information: [place] [address] [phone]              Contact information for after-discharge care     Destination     [facility] .   Service: Skilled Nursing Contact information: [place] [address] [phone]                    Allergies  Allergen Reactions   Sulfonamide Derivatives Hives and Rash   Z-Pak [Azithromycin] Swelling   Dilaudid [Hydromorphone Hcl] Nausea And Vomiting   Sulfa Antibiotics Hives and Rash    Consultations: Urology   Procedures/Studies: DG C-Arm 1-60 Min-No Report  Result Date: 03/28/2022 Fluoroscopy was utilized by the requesting physician.  No radiographic interpretation.   DG Chest 2 View  Result Date: 03/28/2022 CLINICAL DATA:  Sepsis EXAM: CHEST - 2 VIEW COMPARISON:  03/17/2022 FINDINGS: Frontal and  lateral views of the chest demonstrate mild enlargement the cardiac silhouette. There is central vascular congestion. Left lower lobe airspace disease concerning for pneumonia. No effusion or pneumothorax. No acute bony abnormality. IMPRESSION: 1. Left lower lobe airspace disease compatible with pneumonia. Aspiration could be considered in the appropriate clinical  setting. 2. Mild enlarged cardiac silhouette and central vascular congestion. Electronically Signed   By: Randa Ngo M.D.   On: 03/28/2022 20:57   CT ABDOMEN PELVIS WO CONTRAST  Result Date: 03/28/2022 CLINICAL DATA:  Abdominal pain, acute, nonlocalized. Fever, altered mental status. EXAM: CT ABDOMEN AND PELVIS WITHOUT CONTRAST TECHNIQUE: Multidetector CT imaging of the abdomen and pelvis was performed following the standard protocol without IV contrast. RADIATION DOSE REDUCTION: This exam was performed according to the departmental dose-optimization program which includes automated exposure control, adjustment of the mA and/or kV according to patient size and/or use of iterative reconstruction technique. COMPARISON:  03/17/2022 FINDINGS: Lower chest: Focal consolidation has developed within the posterior basal left lower lobe, new since prior examination, possibly infectious in the acute setting. No pleural effusion. At least mild coronary artery calcification. Global cardiac size within normal limits. Hepatobiliary: Previously noted heterogeneously enhancing mass within the right hepatic dome is not as well visualized on this noncontrast examination and is poorly delineated. Cirrhotic morphology of the liver again noted no intra or extrahepatic biliary ductal dilation. Gallbladder unremarkable. Pancreas: Unremarkable Spleen: Moderate splenomegaly is unchanged with the spleen measuring 18.1 cm in greatest dimension. Adrenals/Urinary Tract: The adrenal glands are unremarkable. The kidneys are normal in size and position. Previously noted tiny  obstructing calculus at the right ureterovesicular junction is no longer visualized, presumably having passed in the interval, and right-sided hydronephrosis has resolved. No residual right sided renal or ureteral calculi are identified. In the interval since prior examination, multiple calculi have migrated from the left pelvic calyceal system into the left ureteropelvic junction with an obstructing 7 x 12 mm calculus now resulting in moderate left hydronephrosis. Numerous additional nonobstructing left renal pelvic and calyceal calculi are identified measuring up to 8 mm. No perinephric fluid collections are seen. The bladder is unremarkable. Stomach/Bowel: Moderate colonic stool burden without evidence of obstruction. Mild sigmoid diverticulosis. The stomach, small bowel, and large bowel are otherwise unremarkable. Appendix absent. No free intraperitoneal gas or fluid. Vascular/Lymphatic: Aortic atherosclerosis. No enlarged abdominal or pelvic lymph nodes. Reproductive: Prostate is unremarkable. Other: Umbilical hernia repair has been performed. No recurrent abdominal wall hernia. Musculoskeletal: Left hip ORIF has been performed. Osseous structures are age-appropriate. No acute bone abnormality. Multiple healing left rib fractures again noted. IMPRESSION: 1. Interval development of a 7 x 12 mm obstructing calculus at the left ureteropelvic junction resulting in moderate left hydronephrosis. 2. Interval resolution of right-sided hydronephrosis and passage of a tiny obstructing calculus at the right ureterovesicular junction. 3. Interval development of focal consolidation within the posterior basal left lower lobe, possibly infectious in nature. 4. Cirrhotic morphology of the liver. Previously noted heterogeneously enhancing mass within the right hepatic dome is not as well visualized on this noncontrast examination. 5. Moderate colonic stool burden without evidence of obstruction. Aortic Atherosclerosis  (ICD10-I70.0). Electronically Signed   By: Fidela Salisbury M.D.   On: 03/28/2022 20:52   CT Head Wo Contrast  Result Date: 03/28/2022 CLINICAL DATA:  Delirium EXAM: CT HEAD WITHOUT CONTRAST TECHNIQUE: Contiguous axial images were obtained from the base of the skull through the vertex without intravenous contrast. RADIATION DOSE REDUCTION: This exam was performed according to the departmental dose-optimization program which includes automated exposure control, adjustment of the mA and/or kV according to patient size and/or use of iterative reconstruction technique. COMPARISON:  Brain CT March 17, 2022 FINDINGS: Brain: Redemonstrated frontal volume loss. No evidence for acute cortically based infarct, intracranial hemorrhage, mass lesion or mass-effect. Vascular:  No hyperdense vessel or unexpected calcification. Skull: Intact. Sinuses/Orbits: No acute finding. Other: None. IMPRESSION: No acute intracranial process.  Volume loss. Electronically Signed   By: Lovey Newcomer M.D.   On: 03/28/2022 20:43   DG C-Arm 1-60 Min-No Report  Result Date: 03/18/2022 Fluoroscopy was utilized by the requesting physician.  No radiographic interpretation.   CT CHEST ABDOMEN PELVIS W CONTRAST  Result Date: 03/17/2022 CLINICAL DATA:  Bacteremic, history of stones, positive UTI, history of cirrhosis EXAM: CT CHEST, ABDOMEN, AND PELVIS WITH CONTRAST TECHNIQUE: Multidetector CT imaging of the chest, abdomen and pelvis was performed following the standard protocol during bolus administration of intravenous contrast. RADIATION DOSE REDUCTION: This exam was performed according to the departmental dose-optimization program which includes automated exposure control, adjustment of the mA and/or kV according to patient size and/or use of iterative reconstruction technique. CONTRAST:  147m OMNIPAQUE IOHEXOL 300 MG/ML  SOLN COMPARISON:  CT 11/30/2021, 05/06/2017, 10/12/2021 FINDINGS: CT CHEST FINDINGS Cardiovascular: Heart size within  normal limits. Thoracic aorta is nonaneurysmal. Minimal atherosclerotic calcification of the aorta and coronary arteries. Central pulmonary vasculature within normal limits. Mediastinum/Nodes: No enlarged mediastinal, hilar, or axillary lymph nodes. Thyroid gland, trachea, and esophagus demonstrate no significant findings. Lungs/Pleura: Lungs are clear. No pleural effusion or pneumothorax. Musculoskeletal: Nondisplaced fractures of the lateral right sixth, seventh, and eighth ribs, which appear acute. There are subacute healing fractures of the anterolateral left fifth through seventh ribs. Additional old healed fractures of the right scapula and several right ribs. Chronic mild superior endplate compression fractures of T3 and T6. No suspicious lytic or sclerotic bone lesion is seen. CT ABDOMEN PELVIS FINDINGS Hepatobiliary: Significant interval enlargement of poorly defined hypoattenuating mass at the right hepatic dome comment now measuring approximately 10.4 x 4.5 x 6.6 cm (series 4, image 41), previously measured approximately 3.0 cm on 11/30/2021. Biliary dilatation is seen within the right hepatic lobe anteriorly. Cirrhotic liver morphology with nodular contour and caudate lobe hypertrophy. Gallbladder is moderately distended without hyperdense stone or inflammatory changes. Pancreas: Unremarkable. No pancreatic ductal dilatation or surrounding inflammatory changes. Spleen: Splenomegaly. Adrenals/Urinary Tract: Unremarkable adrenal glands. Multiple nonobstructing stones within the left kidney including a 1.1 cm stone within the renal pelvis. Subtle punctate focus of hyperdensity in the region of the right ureterovesical junction could represent a tiny stone (series 4, image 111). There is mild hydronephrosis of the right kidney. Unchanged left renal cysts, which do not require follow-up imaging. Urinary bladder wall is slightly thickened. Stomach/Bowel: Stomach is within normal limits. No evidence of bowel  wall thickening, distention, or inflammatory changes. Vascular/Lymphatic: Aortic atherosclerosis. No enlarged abdominal or pelvic lymph nodes. Reproductive: Prostate is unremarkable. Other: No free fluid. No abdominopelvic fluid collection. No pneumoperitoneum. No abdominal wall hernia. Musculoskeletal: Prior left hip ORIF.  No acute bony findings. IMPRESSION: 1. Cirrhotic liver morphology with evidence of portal hypertension including splenomegaly. 2. Significant interval enlargement of poorly defined hypoattenuating mass at the right hepatic dome now measuring up to 10.4 x 4.5 x 6.6 cm, previously measured approximately 3.0 cm on 11/30/2021. Findings are concerning for hepatocellular carcinoma. Further evaluation with contrast-enhanced hepatic protocol MRI is recommended, preferably on a nonemergent, outpatient basis. 3. Mild right-sided hydronephrosis with a questionable punctate stone at the right ureterovesical junction. 4. Multiple nonobstructing left renal stones including a 1.1 cm stone within the renal pelvis. 5. Nondisplaced fractures of the lateral right sixth, seventh, and eighth ribs, which appear acute. Subacute healing fractures of the anterolateral left fifth through seventh ribs. No  pneumothorax. Electronically Signed   By: Davina Poke D.O.   On: 03/17/2022 11:57   CT Head Wo Contrast  Result Date: 03/17/2022 CLINICAL DATA:  Encephalopathic.  Known cancer. EXAM: CT HEAD WITHOUT CONTRAST CT CERVICAL SPINE WITHOUT CONTRAST TECHNIQUE: Multidetector CT imaging of the head and cervical spine was performed following the standard protocol without intravenous contrast. Multiplanar CT image reconstructions of the cervical spine were also generated. RADIATION DOSE REDUCTION: This exam was performed according to the departmental dose-optimization program which includes automated exposure control, adjustment of the mA and/or kV according to patient size and/or use of iterative reconstruction technique.  COMPARISON:  December 17, 2021 FINDINGS: CT HEAD FINDINGS Brain: No subdural, epidural, or subarachnoid hemorrhage. Volume loss, particularly frontally is similar in the interval. No mass effect or midline shift. Ventricles and sulci are stable. Cerebellum, brainstem, and basal cisterns are within normal limits. No mass effect or midline shift. No acute cortical ischemia or infarct. Vascular: No hyperdense vessel or unexpected calcification. Skull: Normal. Negative for fracture or focal lesion. Sinuses/Orbits: No acute finding. Other: None. CT CERVICAL SPINE FINDINGS Alignment: 3 mm anterolisthesis of C6 versus C7, not significantly changed in the interval. No other malalignment. Skull base and vertebrae: No acute fracture. No primary bone lesion or focal pathologic process. Soft tissues and spinal canal: No prevertebral fluid or swelling. No visible canal hematoma. Disc levels: Multilevel degenerative disc disease. Mild facet degenerative changes. Upper chest: Negative. Other: No other abnormalities. IMPRESSION: 1. No acute intracranial abnormalities. Volume loss with prominence of the sulci. 2. 3 mm anterolisthesis of C6 versus C7 is nonacute. No fracture or acute traumatic malalignment. Degenerative changes. Electronically Signed   By: Dorise Bullion III M.D.   On: 03/17/2022 11:52   CT Cervical Spine Wo Contrast  Result Date: 03/17/2022 CLINICAL DATA:  Encephalopathic.  Known cancer. EXAM: CT HEAD WITHOUT CONTRAST CT CERVICAL SPINE WITHOUT CONTRAST TECHNIQUE: Multidetector CT imaging of the head and cervical spine was performed following the standard protocol without intravenous contrast. Multiplanar CT image reconstructions of the cervical spine were also generated. RADIATION DOSE REDUCTION: This exam was performed according to the departmental dose-optimization program which includes automated exposure control, adjustment of the mA and/or kV according to patient size and/or use of iterative reconstruction  technique. COMPARISON:  December 17, 2021 FINDINGS: CT HEAD FINDINGS Brain: No subdural, epidural, or subarachnoid hemorrhage. Volume loss, particularly frontally is similar in the interval. No mass effect or midline shift. Ventricles and sulci are stable. Cerebellum, brainstem, and basal cisterns are within normal limits. No mass effect or midline shift. No acute cortical ischemia or infarct. Vascular: No hyperdense vessel or unexpected calcification. Skull: Normal. Negative for fracture or focal lesion. Sinuses/Orbits: No acute finding. Other: None. CT CERVICAL SPINE FINDINGS Alignment: 3 mm anterolisthesis of C6 versus C7, not significantly changed in the interval. No other malalignment. Skull base and vertebrae: No acute fracture. No primary bone lesion or focal pathologic process. Soft tissues and spinal canal: No prevertebral fluid or swelling. No visible canal hematoma. Disc levels: Multilevel degenerative disc disease. Mild facet degenerative changes. Upper chest: Negative. Other: No other abnormalities. IMPRESSION: 1. No acute intracranial abnormalities. Volume loss with prominence of the sulci. 2. 3 mm anterolisthesis of C6 versus C7 is nonacute. No fracture or acute traumatic malalignment. Degenerative changes. Electronically Signed   By: Dorise Bullion III M.D.   On: 03/17/2022 11:52   DG Chest Portable 1 View  Result Date: 03/17/2022 CLINICAL DATA:  Acute mental status change.  Fever. EXAM: PORTABLE CHEST 1 VIEW COMPARISON:  December 17, 2021 FINDINGS: The heart, hila, and mediastinum are unchanged. No pneumothorax. No nodules or masses. No focal infiltrate to explain the patient's fever. IMPRESSION: No active disease. Electronically Signed   By: Dorise Bullion III M.D.   On: 03/17/2022 07:48   OCT, Retina - OU - Both Eyes  Result Date: 03/07/2022 Right Eye Quality was good. Central Foveal Thickness: 300. Progression has been stable. Findings include normal foveal contour, no IRF, no SRF,  vitreomacular adhesion (Focal shallow SRF within schisis cavity -- schisis detachment superior periphery, separate focal schisis cavity ST periphery -- both caught on widefield - not imaged today). Left Eye Quality was good. Central Foveal Thickness: 285. Progression has improved. Findings include normal foveal contour, no IRF, no SRF (Persistent cystic changes inferior macula - slightly improved). Notes *Images captured and stored on drive Diagnosis / Impression: OD: Focal shallow SRF within schisis cavity -- schisis detachment superior periphery, separate focal schisis cavity ST periphery -- previously caught on widefield -- not imaged today OS: persistent cystic changes inferior macula--slightly improved Clinical management: See below Abbreviations: NFP - Normal foveal profile. CME - cystoid macular edema. PED - pigment epithelial detachment. IRF - intraretinal fluid. SRF - subretinal fluid. EZ - ellipsoid zone. ERM - epiretinal membrane. ORA - outer retinal atrophy. ORT - outer retinal tubulation. SRHM - subretinal hyper-reflective material. IRHM - intraretinal hyper-reflective material      Subjective: Patient was seen and examined at bedside.  Overnight events noted.   Patient reports feeling much improved.  He denies any further flank pain.   Patient is being discharged to SNF for rehab.  Discharge Exam: Vitals:   04/01/22 0844 04/01/22 1357  BP: 110/68 124/71  Pulse: 69 78  Resp: 18 18  Temp: 98.2 F (36.8 C) 98.3 F (36.8 C)  SpO2: 97% 100%   Vitals:   04/01/22 0123 04/01/22 0625 04/01/22 0844 04/01/22 1357  BP: 108/67 137/84 110/68 124/71  Pulse: 67 68 69 78  Resp: 18 17 18 18   Temp: 98.2 F (36.8 C)  98.2 F (36.8 C) 98.3 F (36.8 C)  TempSrc: Oral  Oral Oral  SpO2: 100% 98% 97% 100%  Weight:      Height:        General: Pt is alert, awake, not in acute distress. Cardiovascular: RRR, S1/S2 +, no rubs, no gallops. Respiratory: CTA bilaterally, no wheezing, no  rhonchi Abdominal: Soft, NT, ND, bowel sounds + Extremities: no edema, no cyanosis    The results of significant diagnostics from this hospitalization (including imaging, microbiology, ancillary and laboratory) are listed below for reference.     Microbiology: Recent Results (from the past 240 hour(s))  Urine Culture     Status: None   Collection Time: 03/28/22  7:36 PM   Specimen: Urine, Clean Catch  Result Value Ref Range Status   Specimen Description   Final    URINE, CLEAN CATCH Performed at [facility], [place] [address], [place], [place] [address]    Special Requests   Final    NONE Performed at [facility], [place] [address] [place], [place] [address]    Culture   Final    NO GROWTH Performed at [facility], [place] [address], [place], [place] [address]    Report Status 03/30/2022 FINAL  Final  Culture, blood (Routine x 2)     Status: None (Preliminary result)   Collection Time: 03/28/22  7:50 PM   Specimen: BLOOD  Result Value Ref Range Status   Specimen Description   Final    BLOOD BLOOD RIGHT ARM Performed at [place] [address], [place], [place] [address]    Special Requests   Final    BOTTLES DRAWN AEROBIC AND ANAEROBIC Blood Culture adequate volume Performed at [facility] [address] [place], [place] [address]    Culture   Final    NO GROWTH 3 DAYS Performed at [facility], [place] [address], [place], [place] [address]    Report Status PENDING  Incomplete  Resp Panel by RT-PCR (Flu A&B, Covid) Anterior Nasal Swab     Status: None   Collection Time: 03/28/22  7:55 PM   Specimen: Anterior Nasal Swab  Result Value Ref Range Status   SARS Coronavirus 2 by RT PCR NEGATIVE NEGATIVE Final    Comment: (NOTE) SARS-CoV-2 target nucleic acids are NOT DETECTED.  The SARS-CoV-2 RNA is generally detectable in upper respiratory specimens during the acute phase of  infection. The lowest concentration of SARS-CoV-2 viral copies this assay can detect is 138 copies/mL. A negative result does not preclude SARS-Cov-2 infection and should not be used as the sole basis for treatment or other patient management decisions. A negative result may occur with  improper specimen collection/handling, submission of specimen other than nasopharyngeal swab, presence of viral mutation(s) within the areas targeted by this assay, and inadequate number of viral copies(<138 copies/mL). A negative result must be combined with clinical observations, patient history, and epidemiological information. The expected result is Negative.  Fact Sheet for Patients:  EntrepreneurPulse.com.au  Fact Sheet for Healthcare Providers:  IncredibleEmployment.be  This test is no t yet approved or cleared by the [place] FDA and  has been authorized for detection and/or diagnosis of SARS-CoV-2 by FDA under an Emergency Use Authorization (EUA). This EUA will remain  in effect (meaning this test can be used) for the duration of the COVID-19 declaration under Section 564(b)(1) of the Act, 21 U.S.C.section 360bbb-3(b)(1), unless the authorization is terminated  or revoked sooner.       Influenza A by PCR NEGATIVE NEGATIVE Final   Influenza B by PCR NEGATIVE NEGATIVE Final    Comment: (NOTE) The Xpert Xpress SARS-CoV-2/FLU/RSV plus assay is intended as an aid in the diagnosis of influenza from Nasopharyngeal swab specimens and should not be used as a sole basis for treatment. Nasal washings and aspirates are unacceptable for Xpert Xpress SARS-CoV-2/FLU/RSV testing.  Fact Sheet for Patients: EntrepreneurPulse.com.au  Fact Sheet for Healthcare Providers: IncredibleEmployment.be  This test is not yet approved or cleared by the [place] FDA and has been authorized for detection and/or diagnosis of SARS-CoV-2  by FDA under an Emergency Use Authorization (EUA). This EUA will remain in effect (meaning this test can be used) for the duration of the COVID-19 declaration under Section 564(b)(1) of the Act, 21 U.S.C. section 360bbb-3(b)(1), unless the authorization is terminated or revoked.  Performed at [facility], [place] [address] [place], [place] [address]   Culture, blood (Routine x 2)     Status: None (Preliminary result)   Collection Time: 03/28/22  8:19 PM   Specimen: BLOOD  Result Value Ref Range Status   Specimen Description   Final    BLOOD BLOOD LEFT HAND Performed at [facility] [address] [place], [place] [address]    Special Requests   Final  BOTTLES DRAWN AEROBIC ONLY Blood Culture results may not be optimal due to an inadequate volume of blood received in culture bottles Performed at [facility], [place] [address], [place], [place] [address]    Culture   Final    NO GROWTH 3 DAYS Performed at [facility], [place] [address] [place], [place] [address]    Report Status PENDING  Incomplete  MRSA Next Gen by PCR, Nasal     Status: None   Collection Time: 03/29/22 10:48 AM   Specimen: Nasal Mucosa; Nasal Swab  Result Value Ref Range Status   MRSA by PCR Next Gen NOT DETECTED NOT DETECTED Final    Comment: (NOTE) The GeneXpert MRSA Assay (FDA approved for NASAL specimens only), is one component of a comprehensive MRSA colonization surveillance program. It is not intended to diagnose MRSA infection nor to guide or monitor treatment for MRSA infections. Test performance is not FDA approved in patients less than 72 years old. Performed at [facility], [place] [address], [place], [place] [address]      Labs: BNP (last 3 results) No results for input(s): "BNP" in the last 8760 hours. Basic Metabolic Panel: Recent Labs  Lab 03/28/22 1950 03/30/22 0516  NA 142 138  K 3.9 3.5  CL 111 110   CO2 25 24  GLUCOSE 85 114*  BUN 11 15  CREATININE 0.79 0.63  CALCIUM 9.3 8.3*   Liver Function Tests: Recent Labs  Lab 03/28/22 1950 03/30/22 0516  AST 35 37  ALT 24 21  ALKPHOS 373* 244*  BILITOT 1.6* 0.9  PROT 6.6 5.0*  ALBUMIN 3.6 2.6*   No results for input(s): "LIPASE", "AMYLASE" in the last 168 hours. Recent Labs  Lab 03/28/22 1950  AMMONIA 40*   CBC: Recent Labs  Lab 03/28/22 1950 03/29/22 0143 03/30/22 0516 03/31/22 0532  WBC 14.1* 11.8* 6.0 4.8  NEUTROABS 13.2*  --   --   --   HGB 12.4* 10.1* 8.9* 8.8*  HCT 38.5* 32.0* 27.6* 27.8*  MCV 92.5 94.4 93.2 93.3  PLT 159 97* 97* 109*   Cardiac Enzymes: No results for input(s): "CKTOTAL", "CKMB", "CKMBINDEX", "TROPONINI" in the last 168 hours. BNP: Invalid input(s): "POCBNP" CBG: Recent Labs  Lab 03/28/22 2328 03/29/22 0127  GLUCAP 88 102*   D-Dimer No results for input(s): "DDIMER" in the last 72 hours. Hgb A1c No results for input(s): "HGBA1C" in the last 72 hours. Lipid Profile No results for input(s): "CHOL", "HDL", "LDLCALC", "TRIG", "CHOLHDL", "LDLDIRECT" in the last 72 hours. Thyroid function studies No results for input(s): "TSH", "T4TOTAL", "T3FREE", "THYROIDAB" in the last 72 hours.  Invalid input(s): "FREET3" Anemia work up No results for input(s): "VITAMINB12", "FOLATE", "FERRITIN", "TIBC", "IRON", "RETICCTPCT" in the last 72 hours. Urinalysis    Component Value Date/Time   COLORURINE YELLOW 03/28/2022 1936   APPEARANCEUR CLEAR 03/28/2022 1936   APPEARANCEUR Clear 01/12/2020 0906   LABSPEC 1.013 03/28/2022 1936   PHURINE 7.0 03/28/2022 1936   GLUCOSEU NEGATIVE 03/28/2022 1936   HGBUR LARGE (A) 03/28/2022 1936   BILIRUBINUR NEGATIVE 03/28/2022 1936   BILIRUBINUR Negative 01/12/2020 0906   KETONESUR NEGATIVE 03/28/2022 1936   PROTEINUR 30 (A) 03/28/2022 1936   UROBILINOGEN 4.0 (H) 12/02/2019 1805   NITRITE NEGATIVE 03/28/2022 1936   LEUKOCYTESUR NEGATIVE 03/28/2022 1936    Sepsis Labs Recent Labs  Lab 03/28/22 1950 03/29/22 0143 03/30/22 0516 03/31/22 0532  WBC 14.1* 11.8* 6.0 4.8   Microbiology Recent Results (from  the past 240 hour(s))  Urine Culture     Status: None   Collection Time: 03/28/22  7:36 PM   Specimen: Urine, Clean Catch  Result Value Ref Range Status   Specimen Description   Final    URINE, CLEAN CATCH Performed at [facility], [place] [address] [place], [place] [address]    Special Requests   Final    NONE Performed at [facility], [place] [address], [place], [place] [address]    Culture   Final    NO GROWTH Performed at [facility], [place] [address] [place], [place] [address]    Report Status 03/30/2022 FINAL  Final  Culture, blood (Routine x 2)     Status: None (Preliminary result)   Collection Time: 03/28/22  7:50 PM   Specimen: BLOOD  Result Value Ref Range Status   Specimen Description   Final    BLOOD BLOOD RIGHT ARM Performed at [facility] [address] [place], [place] [address]    Special Requests   Final    BOTTLES DRAWN AEROBIC AND ANAEROBIC Blood Culture adequate volume Performed at [facility] [address], [place], [place] [address]    Culture   Final    NO GROWTH 3 DAYS Performed at [facility], [place] [address] [place], [place] [address]    Report Status PENDING  Incomplete  Resp Panel by RT-PCR (Flu A&B, Covid) Anterior Nasal Swab     Status: None   Collection Time: 03/28/22  7:55 PM   Specimen: Anterior Nasal Swab  Result Value Ref Range Status   SARS Coronavirus 2 by RT PCR NEGATIVE NEGATIVE Final    Comment: (NOTE) SARS-CoV-2 target nucleic acids are NOT DETECTED.  The SARS-CoV-2 RNA is generally detectable in upper respiratory specimens during the acute phase of infection. The lowest concentration of SARS-CoV-2 viral copies this assay can detect is 138 copies/mL. A negative result does not  preclude SARS-Cov-2 infection and should not be used as the sole basis for treatment or other patient management decisions. A negative result may occur with  improper specimen collection/handling, submission of specimen other than nasopharyngeal swab, presence of viral mutation(s) within the areas targeted by this assay, and inadequate number of viral copies(<138 copies/mL). A negative result must be combined with clinical observations, patient history, and epidemiological information. The expected result is Negative.  Fact Sheet for Patients:  EntrepreneurPulse.com.au  Fact Sheet for Healthcare Providers:  IncredibleEmployment.be  This test is no t yet approved or cleared by the [place] FDA and  has been authorized for detection and/or diagnosis of SARS-CoV-2 by FDA under an Emergency Use Authorization (EUA). This EUA will remain  in effect (meaning this test can be used) for the duration of the COVID-19 declaration under Section 564(b)(1) of the Act, 21 U.S.C.section 360bbb-3(b)(1), unless the authorization is terminated  or revoked sooner.       Influenza A by PCR NEGATIVE NEGATIVE Final   Influenza B by PCR NEGATIVE NEGATIVE Final    Comment: (NOTE) The Xpert Xpress SARS-CoV-2/FLU/RSV plus assay is intended as an aid in the diagnosis of influenza from Nasopharyngeal swab specimens and should not be used as a sole basis for treatment. Nasal washings and aspirates are unacceptable for Xpert Xpress SARS-CoV-2/FLU/RSV testing.  Fact Sheet for Patients: EntrepreneurPulse.com.au  Fact Sheet for Healthcare Providers: IncredibleEmployment.be  This test is not yet approved or cleared by the [place] FDA and has  been authorized for detection and/or diagnosis of SARS-CoV-2 by FDA under an Emergency Use Authorization (EUA). This EUA will remain in effect (meaning this test can be used) for the  duration of the COVID-19 declaration under Section 564(b)(1) of the Act, 21 U.S.C. section 360bbb-3(b)(1), unless the authorization is terminated or revoked.  Performed at [facility], [place] [address], [place], [place] [address]   Culture, blood (Routine x 2)     Status: None (Preliminary result)   Collection Time: 03/28/22  8:19 PM   Specimen: BLOOD  Result Value Ref Range Status   Specimen Description   Final    BLOOD BLOOD LEFT HAND Performed at [facility] [address], [place], [place] [address]    Special Requests   Final    BOTTLES DRAWN AEROBIC ONLY Blood Culture results may not be optimal due to an inadequate volume of blood received in culture bottles Performed at [facility] [address], [place], [place] [address]    Culture   Final    NO GROWTH 3 DAYS Performed at [facility], [place] [address], [place], [place] [address]    Report Status PENDING  Incomplete  MRSA Next Gen by PCR, Nasal     Status: None   Collection Time: 03/29/22 10:48 AM   Specimen: Nasal Mucosa; Nasal Swab  Result Value Ref Range Status   MRSA by PCR Next Gen NOT DETECTED NOT DETECTED Final    Comment: (NOTE) The GeneXpert MRSA Assay (FDA approved for NASAL specimens only), is one component of a comprehensive MRSA colonization surveillance program. It is not intended to diagnose MRSA infection nor to guide or monitor treatment for MRSA infections. Test performance is not FDA approved in patients less than 67 years old. Performed at [facility], [place] [address] [place],  [address]      Time coordinating discharge: Over 30 minutes  SIGNED:   Shawna Clamp, MD  [facility] 04/01/2022, 2:31 PM Pager   If 7PM-7AM, please contact night-coverage

## 2022-04-01 NOTE — Discharge Instructions (Signed)
Advised to follow-up with primary care physician in 1 week. Advised to follow-up with urology as scheduled. Advised to take omnicef 300 mg twice daily for 10 days. Patient underwent successful ureteral stent by urology.

## 2022-04-01 NOTE — Progress Notes (Signed)
[facility] Spiritual Care Note  Referred by [name] [name] wife [name] [name] for an additional layer of support. [name] [name] was pleased to receive a visit, sharing stories about meaning and challenge in his life. He notes that he may be interested in a counseling referral and plans to reach out for more details later in his treatment process, likely after he returns home from SNF.  Provided reflective listening, normalization of feelings, and emotional support. Family is aware of ongoing chaplain availability, including from the outpatient angle.   [place], [place], [facility] Pager [phone] Voicemail [phone]

## 2022-04-01 NOTE — TOC Progression Note (Signed)
Transition of Care [facility]) - Progression Note    Patient Details  Name: [name] [name] MRN: [id number] Date of Birth: Jul 01, 1960  [facility]) CM/SW Contact  Purcell Mouton, RN Phone Number: 04/01/2022, 2:09 PM  Clinical Narrative:    Spoke with pt's wife concerning SNF bed offers. [place] was selected. Waiting for [facility] to approve. PASRR [id number].    Expected Discharge Plan: Skilled Nursing Facility Barriers to Discharge: Ship broker, Continued Medical Work up  Expected Discharge Plan and Services Expected Discharge Plan: [place]       Living arrangements for the past 2 months: Single Family Home                                       Social Determinants of Health (SDOH) Interventions Food Insecurity Interventions: Intervention Not Indicated Housing Interventions: Intervention Not Indicated Transportation Interventions: Intervention Not Indicated Utilities Interventions: Intervention Not Indicated  Readmission Risk Interventions     No data to display

## 2022-04-01 NOTE — TOC Progression Note (Addendum)
Transition of Care [facility]) - Progression Note    Patient Details  Name: [name] [name] MRN: [id number] Date of Birth: 07-26-1960  [facility] CM/SW Contact  Purcell Mouton, RN Phone Number: 04/01/2022, 2:47 PM  Clinical Narrative:     Corey Harold was called. RN and pt's wife are aware.  Expected Discharge Plan: Skilled Nursing Facility Barriers to Discharge: Ship broker, Continued Medical Work up  Expected Discharge Plan and Services Expected Discharge Plan: [place]       Living arrangements for the past 2 months: Single Family Home Expected Discharge Date: 04/01/22                                     Social Determinants of Health (SDOH) Interventions Food Insecurity Interventions: Intervention Not Indicated Housing Interventions: Intervention Not Indicated Transportation Interventions: Intervention Not Indicated Utilities Interventions: Intervention Not Indicated  Readmission Risk Interventions     No data to display

## 2022-04-01 NOTE — Progress Notes (Signed)
Patient discharging to [facility].  Report called to facility,  AVS placed in packet for transport.  Pt and wife aware and agreeable.  Pt home med returned to wife from pharmacy.  No questions at this time, patient in NAD.

## 2022-04-01 NOTE — Progress Notes (Signed)
PROGRESS NOTE    [name] [name]  SKA:[id number] DOB: [birth year] DOA: 03/28/2022 PCP: [name], MD   Brief Narrative:  This 61 years old male with PMH significant for Crohn's disease on mesalamine/methscopolamine, NASH cirrhosis well compensated, hepatocellular carcinoma , Squamous cell skin cancer, OSA on CPAP, complex partial seizures, diabetes mellitus, esophageal stricture, mandibular osteomyelitis which is now resolved and history of staph bacteremia presented to the ED with sepsis secondary to UTI.  Assessment & Plan:   Principal Problem:   Sepsis with acute end organ damage Active Problems:   Iron deficiency anemia due to chronic blood loss and thrombocytopenia    DM2 (diabetes mellitus, type 2) (HCC)   Liver cirrhosis secondary to NASH with liver mass    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   OSA on CPAP   Complex partial seizures   Hepatocellular carcinoma (HCC)   Ureteral obstruction, left   Crohn's disease ([place])   Acute metabolic encephalopathy   Thrombocytopenia (HCC)  Severe sepsis secondary to UTI: Patient presented with severe sepsis (fever, tachycardia, tachypnea, lactic acid 2.6, leukocytosis, UA consistent with UTI). Patient was empirically started on antibiotics (vancomycin and cefepime) Continued on IV hydration.  Lactic acid normalized. CT abdomen and pelvis shows left-sided obstructing calculus with moderate left hydronephrosis Urology was consulted, Patient underwent ureteral stent on left by Dr. Junious Silk on 10/5. Cultures all negative.  Will change antibiotics to cefdinir on discharge Given his stent in place recurrence of fever after stopping antibiotics last time we will plan for 3 weeks of antibiotic until his stent is removed. Sepsis physiology resolved.  OSA: Continue CPAP at night  Crohn's disease: Continue mesalamine and methscopolamine  Ureteral obstruction left: S/p ureteral stent on left placed by Dr. Junious Silk 10/5. Cultures  all negative.  Continue cefdinir  Hepatocellular carcinoma Continue Xifaxin  Complex partial seizure : Continue gabapentin and Lamictal   DVT prophylaxis: Lovenox Code Status: Full code Family Communication: Wife at bedside. Disposition Plan:   Status is: Inpatient Remains inpatient appropriate because: Admitted for sepsis secondary to UTI.  Found to have obstructing kidney stone ,He underwent left ureteral stent.    Consultants:  Urology  Procedures: Left ureteral stent 03/28/22 Antimicrobials:  Anti-infectives (From admission, onward)    Start     Dose/Rate Route Frequency Ordered Stop   03/31/22 1600  cefdinir (OMNICEF) capsule 300 mg        300 mg Oral Every 12 hours 03/31/22 1141     03/29/22 1600  ceFEPIme (MAXIPIME) 2 g in sodium chloride 0.9 % 100 mL IVPB  Status:  Discontinued        2 g 200 mL/hr over 30 Minutes Intravenous Every 8 hours 03/29/22 0821 03/31/22 1141   03/29/22 1000  vancomycin (VANCOCIN) IVPB 1000 mg/200 mL premix  Status:  Discontinued        1,000 mg 200 mL/hr over 60 Minutes Intravenous Every 12 hours 03/29/22 0826 03/31/22 1141   03/29/22 0830  ceFEPIme (MAXIPIME) 2 g in sodium chloride 0.9 % 100 mL IVPB        2 g 200 mL/hr over 30 Minutes Intravenous STAT 03/29/22 0821 03/29/22 1115   03/29/22 0200  doxycycline (VIBRAMYCIN) 100 mg in sodium chloride 0.9 % 250 mL IVPB  Status:  Discontinued        100 mg 125 mL/hr over 120 Minutes Intravenous Every 12 hours 03/28/22 2241 03/29/22 0113   03/29/22 0200  doxycycline (VIBRAMYCIN) 100 mg in sodium chloride 0.9 %  250 mL IVPB  Status:  Discontinued        100 mg 125 mL/hr over 120 Minutes Intravenous Every 12 hours 03/29/22 0114 03/29/22 0804   03/29/22 0115  rifaximin (XIFAXAN) tablet 550 mg        550 mg Oral 2 times daily 03/29/22 0102     03/29/22 0000  cefTRIAXone (ROCEPHIN) 1 g in sodium chloride 0.9 % 100 mL IVPB  Status:  Discontinued        1 g 200 mL/hr over 30 Minutes Intravenous Every  24 hours 03/28/22 2241 03/29/22 0804   03/28/22 2045  vancomycin (VANCOREADY) IVPB 1250 mg/250 mL        1,250 mg 166.7 mL/hr over 90 Minutes Intravenous  Once 03/28/22 2002 03/28/22 2221   03/28/22 2015  ceFEPIme (MAXIPIME) 2 g in sodium chloride 0.9 % 100 mL IVPB        2 g 200 mL/hr over 30 Minutes Intravenous  Once 03/28/22 2002 03/28/22 2050        Subjective: Patient was seen and examined at bedside.  Overnight events noted.   Patient reports doing much better.  He still feels weak and tired,  denies any flank pain.  Objective: Vitals:   03/31/22 2314 04/01/22 0123 04/01/22 0625 04/01/22 0844  BP: 114/66 108/67 137/84 110/68  Pulse: 79 67 68 69  Resp: 17 18 17 18   Temp: 98.3 F (36.8 C) 98.2 F (36.8 C)  98.2 F (36.8 C)  TempSrc: Oral Oral  Oral  SpO2: 99% 100% 98% 97%  Weight:      Height:        Intake/Output Summary (Last 24 hours) at 04/01/2022 1310 Last data filed at 04/01/2022 1045 Gross per 24 hour  Intake 476 ml  Output 2650 ml  Net -2174 ml   Filed Weights   03/29/22 0133  Weight: 69.6 kg    Examination:  General exam: Appears comfortable, not in any acute distress, deconditioned. Respiratory system: CTA bilaterally, no wheezing, no crackles, normal respiratory effort. Cardiovascular system: S1 & S2 heard, regular rate and rhythm, no murmur. Gastrointestinal system: Abdomen is soft, non tender, non distended, BS+ Central nervous system: Alert and oriented x 3. No focal neurological deficits. Extremities: No edema, no cyanosis, no clubbing Skin: No rashes, lesions or ulcers Psychiatry: Judgement and insight appear normal. Mood & affect appropriate.     Data Reviewed: I have personally reviewed following labs and imaging studies  CBC: Recent Labs  Lab 03/28/22 1950 03/29/22 0143 03/30/22 0516 03/31/22 0532  WBC 14.1* 11.8* 6.0 4.8  NEUTROABS 13.2*  --   --   --   HGB 12.4* 10.1* 8.9* 8.8*  HCT 38.5* 32.0* 27.6* 27.8*  MCV 92.5 94.4 93.2  93.3  PLT 159 97* 97* 197*   Basic Metabolic Panel: Recent Labs  Lab 03/28/22 1950 03/30/22 0516  NA 142 138  K 3.9 3.5  CL 111 110  CO2 25 24  GLUCOSE 85 114*  BUN 11 15  CREATININE 0.79 0.63  CALCIUM 9.3 8.3*   GFR: Estimated Creatinine Clearance: 96.7 mL/min (by C-G formula based on SCr of 0.63 mg/dL). Liver Function Tests: Recent Labs  Lab 03/28/22 1950 03/30/22 0516  AST 35 37  ALT 24 21  ALKPHOS 373* 244*  BILITOT 1.6* 0.9  PROT 6.6 5.0*  ALBUMIN 3.6 2.6*   No results for input(s): "LIPASE", "AMYLASE" in the last 168 hours. Recent Labs  Lab 03/28/22 1950  AMMONIA 40*   Coagulation  Profile: Recent Labs  Lab 03/28/22 1950  INR 1.2   Cardiac Enzymes: No results for input(s): "CKTOTAL", "CKMB", "CKMBINDEX", "TROPONINI" in the last 168 hours. BNP (last 3 results) No results for input(s): "PROBNP" in the last 8760 hours. HbA1C: No results for input(s): "HGBA1C" in the last 72 hours. CBG: Recent Labs  Lab 03/28/22 2328 03/29/22 0127  GLUCAP 88 102*   Lipid Profile: No results for input(s): "CHOL", "HDL", "LDLCALC", "TRIG", "CHOLHDL", "LDLDIRECT" in the last 72 hours. Thyroid Function Tests: No results for input(s): "TSH", "T4TOTAL", "FREET4", "T3FREE", "THYROIDAB" in the last 72 hours. Anemia Panel: No results for input(s): "VITAMINB12", "FOLATE", "FERRITIN", "TIBC", "IRON", "RETICCTPCT" in the last 72 hours. Sepsis Labs: Recent Labs  Lab 03/29/22 0143 03/29/22 0529 03/29/22 0847 03/30/22 0516  LATICACIDVEN 0.4* 2.8* 2.1* 1.9    Recent Results (from the past 240 hour(s))  Urine Culture     Status: None   Collection Time: 03/28/22  7:36 PM   Specimen: Urine, Clean Catch  Result Value Ref Range Status   Specimen Description   Final    URINE, CLEAN CATCH Performed at [facility], [place] [address] [place], [place] [address]    Special Requests   Final    NONE Performed at [facility], [place] [address] [place], [place] [address]    Culture   Final    NO GROWTH Performed at [facility], [place] [address], [place], [place] [address]    Report Status 03/30/2022 FINAL  Final  Culture, blood (Routine x 2)     Status: None (Preliminary result)   Collection Time: 03/28/22  7:50 PM   Specimen: BLOOD  Result Value Ref Range Status   Specimen Description   Final    BLOOD BLOOD RIGHT ARM Performed at [facility] [address], [place], [place] [address]    Special Requests   Final    BOTTLES DRAWN AEROBIC AND ANAEROBIC Blood Culture adequate volume Performed at [place] [address] [place], [place] [address]    Culture   Final    NO GROWTH 2 DAYS Performed at [facility] [address], [place], [place] [address]    Report Status PENDING  Incomplete  Resp Panel by RT-PCR (Flu A&B, Covid) Anterior Nasal Swab     Status: None   Collection Time: 03/28/22  7:55 PM   Specimen: Anterior Nasal Swab  Result Value Ref Range Status   SARS Coronavirus 2 by RT PCR NEGATIVE NEGATIVE Final    Comment: (NOTE) SARS-CoV-2 target nucleic acids are NOT DETECTED.  The SARS-CoV-2 RNA is generally detectable in upper respiratory specimens during the acute phase of infection. The lowest concentration of SARS-CoV-2 viral copies this assay can detect is 138 copies/mL. A negative result does not preclude SARS-Cov-2 infection and should not be used as the sole basis for treatment or other patient management decisions. A negative result may occur with  improper specimen collection/handling, submission of specimen other than nasopharyngeal swab, presence of viral mutation(s) within the areas targeted by this assay, and inadequate number of viral copies(<138 copies/mL). A negative result must be combined with clinical observations, patient history, and epidemiological information. The expected result is Negative.  Fact Sheet for Patients:   EntrepreneurPulse.com.au  Fact Sheet for Healthcare Providers:  IncredibleEmployment.be  This test is no t yet approved or cleared by the [place] FDA and  has been authorized for detection and/or diagnosis of  SARS-CoV-2 by FDA under an Emergency Use Authorization (EUA). This EUA will remain  in effect (meaning this test can be used) for the duration of the COVID-19 declaration under Section 564(b)(1) of the Act, 21 U.S.C.section 360bbb-3(b)(1), unless the authorization is terminated  or revoked sooner.       Influenza A by PCR NEGATIVE NEGATIVE Final   Influenza B by PCR NEGATIVE NEGATIVE Final    Comment: (NOTE) The Xpert Xpress SARS-CoV-2/FLU/RSV plus assay is intended as an aid in the diagnosis of influenza from Nasopharyngeal swab specimens and should not be used as a sole basis for treatment. Nasal washings and aspirates are unacceptable for Xpert Xpress SARS-CoV-2/FLU/RSV testing.  Fact Sheet for Patients: EntrepreneurPulse.com.au  Fact Sheet for Healthcare Providers: IncredibleEmployment.be  This test is not yet approved or cleared by the [place] FDA and has been authorized for detection and/or diagnosis of SARS-CoV-2 by FDA under an Emergency Use Authorization (EUA). This EUA will remain in effect (meaning this test can be used) for the duration of the COVID-19 declaration under Section 564(b)(1) of the Act, 21 U.S.C. section 360bbb-3(b)(1), unless the authorization is terminated or revoked.  Performed at [facility], [place] [address] [place], [place] [address]   Culture, blood (Routine x 2)     Status: None (Preliminary result)   Collection Time: 03/28/22  8:19 PM   Specimen: BLOOD  Result Value Ref Range Status   Specimen Description   Final    BLOOD BLOOD LEFT HAND Performed at [place] [address] [place], [place] [address]     Special Requests   Final    BOTTLES DRAWN AEROBIC ONLY Blood Culture results may not be optimal due to an inadequate volume of blood received in culture bottles Performed at [facility] [place] [address], [place], [place] [address]    Culture   Final    NO GROWTH 2 DAYS Performed at [facility] [address], [place], [place] [address]    Report Status PENDING  Incomplete  MRSA Next Gen by PCR, Nasal     Status: None   Collection Time: 03/29/22 10:48 AM   Specimen: Nasal Mucosa; Nasal Swab  Result Value Ref Range Status   MRSA by PCR Next Gen NOT DETECTED NOT DETECTED Final    Comment: (NOTE) The GeneXpert MRSA Assay (FDA approved for NASAL specimens only), is one component of a comprehensive MRSA colonization surveillance program. It is not intended to diagnose MRSA infection nor to guide or monitor treatment for MRSA infections. Test performance is not FDA approved in patients less than 77 years old. Performed at [facility], [place] [address] [place], [place] [address]     Radiology Studies: No results found.  Scheduled Meds:  acetaminophen  1,000 mg Oral QHS   acitretin  25 mg Oral Daily   cefdinir  300 mg Oral Q12H   feeding supplement  237 mL Oral BID BM   fesoterodine  4 mg Oral Daily   folic acid  2 mg Oral Daily   gabapentin  300 mg Oral Daily   gabapentin  600 mg Oral QHS   lamoTRIgine  150 mg Oral BID   mesalamine  2.4 g Oral Daily   methscopolamine  5 mg Oral BID   pantoprazole  80 mg Oral Q1200   potassium chloride SA  20 mEq Oral BID   rifaximin  550 mg Oral BID   sertraline  200 mg  Oral Q breakfast   tamsulosin  0.4 mg Oral QHS   Continuous Infusions:   LOS: 4 days    Time spent: 50 mins    Daruis Swaim, MD [facility]   If 7PM-7AM, please contact night-coverage

## 2022-04-02 DIAGNOSIS — N1330 Unspecified hydronephrosis: Secondary | ICD-10-CM | POA: Diagnosis not present

## 2022-04-02 DIAGNOSIS — A419 Sepsis, unspecified organism: Secondary | ICD-10-CM | POA: Diagnosis not present

## 2022-04-02 DIAGNOSIS — F4323 Adjustment disorder with mixed anxiety and depressed mood: Secondary | ICD-10-CM | POA: Diagnosis not present

## 2022-04-02 DIAGNOSIS — D696 Thrombocytopenia, unspecified: Secondary | ICD-10-CM | POA: Diagnosis not present

## 2022-04-02 DIAGNOSIS — N201 Calculus of ureter: Secondary | ICD-10-CM | POA: Diagnosis not present

## 2022-04-02 DIAGNOSIS — K7460 Unspecified cirrhosis of liver: Secondary | ICD-10-CM | POA: Diagnosis not present

## 2022-04-02 DIAGNOSIS — G40909 Epilepsy, unspecified, not intractable, without status epilepticus: Secondary | ICD-10-CM | POA: Diagnosis not present

## 2022-04-02 DIAGNOSIS — G9341 Metabolic encephalopathy: Secondary | ICD-10-CM | POA: Diagnosis not present

## 2022-04-02 DIAGNOSIS — K5090 Crohn's disease, unspecified, without complications: Secondary | ICD-10-CM | POA: Diagnosis not present

## 2022-04-02 DIAGNOSIS — K7581 Nonalcoholic steatohepatitis (NASH): Secondary | ICD-10-CM | POA: Diagnosis not present

## 2022-04-02 DIAGNOSIS — R6520 Severe sepsis without septic shock: Secondary | ICD-10-CM | POA: Diagnosis not present

## 2022-04-02 DIAGNOSIS — G4733 Obstructive sleep apnea (adult) (pediatric): Secondary | ICD-10-CM | POA: Diagnosis not present

## 2022-04-02 DIAGNOSIS — D099 Carcinoma in situ, unspecified: Secondary | ICD-10-CM | POA: Diagnosis not present

## 2022-04-03 ENCOUNTER — Ambulatory Visit: Payer: Medicare Other | Admitting: Rehabilitative and Restorative Service Providers"

## 2022-04-03 ENCOUNTER — Telehealth: Payer: Self-pay | Admitting: Family Medicine

## 2022-04-03 DIAGNOSIS — H5462 Unqualified visual loss, left eye, normal vision right eye: Secondary | ICD-10-CM | POA: Diagnosis not present

## 2022-04-03 DIAGNOSIS — M6281 Muscle weakness (generalized): Secondary | ICD-10-CM | POA: Diagnosis not present

## 2022-04-03 DIAGNOSIS — Z9181 History of falling: Secondary | ICD-10-CM | POA: Diagnosis not present

## 2022-04-03 DIAGNOSIS — R262 Difficulty in walking, not elsewhere classified: Secondary | ICD-10-CM | POA: Diagnosis not present

## 2022-04-03 DIAGNOSIS — R5381 Other malaise: Secondary | ICD-10-CM | POA: Diagnosis not present

## 2022-04-03 DIAGNOSIS — E119 Type 2 diabetes mellitus without complications: Secondary | ICD-10-CM | POA: Diagnosis not present

## 2022-04-03 DIAGNOSIS — G9341 Metabolic encephalopathy: Secondary | ICD-10-CM | POA: Diagnosis not present

## 2022-04-03 DIAGNOSIS — E46 Unspecified protein-calorie malnutrition: Secondary | ICD-10-CM | POA: Diagnosis not present

## 2022-04-03 DIAGNOSIS — N390 Urinary tract infection, site not specified: Secondary | ICD-10-CM | POA: Diagnosis not present

## 2022-04-03 DIAGNOSIS — E43 Unspecified severe protein-calorie malnutrition: Secondary | ICD-10-CM | POA: Diagnosis not present

## 2022-04-03 DIAGNOSIS — G40909 Epilepsy, unspecified, not intractable, without status epilepticus: Secondary | ICD-10-CM | POA: Diagnosis not present

## 2022-04-03 DIAGNOSIS — R52 Pain, unspecified: Secondary | ICD-10-CM | POA: Diagnosis not present

## 2022-04-03 DIAGNOSIS — K5090 Crohn's disease, unspecified, without complications: Secondary | ICD-10-CM | POA: Diagnosis not present

## 2022-04-03 LAB — CULTURE, BLOOD (ROUTINE X 2)
Culture: NO GROWTH
Culture: NO GROWTH
Special Requests: ADEQUATE

## 2022-04-04 ENCOUNTER — Other Ambulatory Visit: Payer: Self-pay | Admitting: Gastroenterology

## 2022-04-04 DIAGNOSIS — E46 Unspecified protein-calorie malnutrition: Secondary | ICD-10-CM | POA: Diagnosis not present

## 2022-04-04 DIAGNOSIS — R5381 Other malaise: Secondary | ICD-10-CM | POA: Diagnosis not present

## 2022-04-04 DIAGNOSIS — E43 Unspecified severe protein-calorie malnutrition: Secondary | ICD-10-CM | POA: Diagnosis not present

## 2022-04-04 DIAGNOSIS — N390 Urinary tract infection, site not specified: Secondary | ICD-10-CM | POA: Diagnosis not present

## 2022-04-04 DIAGNOSIS — R52 Pain, unspecified: Secondary | ICD-10-CM | POA: Diagnosis not present

## 2022-04-04 DIAGNOSIS — R262 Difficulty in walking, not elsewhere classified: Secondary | ICD-10-CM | POA: Diagnosis not present

## 2022-04-04 DIAGNOSIS — Z9181 History of falling: Secondary | ICD-10-CM | POA: Diagnosis not present

## 2022-04-04 DIAGNOSIS — G9341 Metabolic encephalopathy: Secondary | ICD-10-CM | POA: Diagnosis not present

## 2022-04-04 DIAGNOSIS — K5090 Crohn's disease, unspecified, without complications: Secondary | ICD-10-CM | POA: Diagnosis not present

## 2022-04-04 DIAGNOSIS — E119 Type 2 diabetes mellitus without complications: Secondary | ICD-10-CM | POA: Diagnosis not present

## 2022-04-04 DIAGNOSIS — M6281 Muscle weakness (generalized): Secondary | ICD-10-CM | POA: Diagnosis not present

## 2022-04-04 NOTE — Telephone Encounter (Signed)
04/03/22 1650:  Pt's wife wanted to notify [place] about recent hospitalization and plans. Pt had recent rehospitalization with recurrent renal calculi and hydronephrosis with subsequent development of UTI and urosepsis.  Urology placed a ureteral stent during hospitalization and pt was weak so he was sent to [facility] for rehab.  His wife [name] states that they have an adult special needs son that lives with them and she is having surgery next week.  She is meeting with the facility doc who is serving as pt's attending currently to discuss his plan of care and length of stay.  She would like for him to stay until she is home from having her surgery and they have additional help.  Pt has NASH, developed non-alcoholic cirrhosis and subsequently Hepatocellular carcinoma.  He has been receiving chemotherapy and in that time his liver lesion has increased from 3 to 10 cm on most recent scan.  Pt has not wanted to discuss his prognosis but she did ask the doctor outside the room who indicated that if tumor has really grown that much he may survive 2-3 months.  They may try a different chemotherapy but he will go to [facility] to the Oncologist there who will repeat the scan to confirm if the growth has happened as described.  She states he then told her that they might be able to try a different chemotherapy but if growth that substantial they may discuss Hospice as well. She was encouraged to talk with the facility doc to have them send a referral over to [place] if pt not being d/c'd within the week.  Advised we could continue to follow at [facility] with a referral and pick him back up when he was d/c'd to home. She verbalized understanding. Damaris Hippo FNP-C

## 2022-04-05 ENCOUNTER — Other Ambulatory Visit: Payer: Self-pay | Admitting: *Deleted

## 2022-04-05 NOTE — Patient Outreach (Signed)
Mr. Poehler resides in [facility]. Screening for care coordination needs as benefit of insurance plan and PCP.   Secure communication sent to SNF SW to make aware writer is following for transition plans.   PCP office [place] at [facility] has [facility] care management.   Marthenia Rolling, MSN, RN,BSN [place] Acute Care Coordinator [phone] (Direct dial)

## 2022-04-08 ENCOUNTER — Ambulatory Visit (INDEPENDENT_AMBULATORY_CARE_PROVIDER_SITE_OTHER): Payer: 59 | Admitting: Podiatry

## 2022-04-08 ENCOUNTER — Encounter: Payer: Self-pay | Admitting: Podiatry

## 2022-04-08 DIAGNOSIS — R52 Pain, unspecified: Secondary | ICD-10-CM | POA: Diagnosis not present

## 2022-04-08 DIAGNOSIS — K5090 Crohn's disease, unspecified, without complications: Secondary | ICD-10-CM | POA: Diagnosis not present

## 2022-04-08 DIAGNOSIS — L03032 Cellulitis of left toe: Secondary | ICD-10-CM | POA: Diagnosis not present

## 2022-04-08 DIAGNOSIS — E119 Type 2 diabetes mellitus without complications: Secondary | ICD-10-CM | POA: Diagnosis not present

## 2022-04-08 DIAGNOSIS — E43 Unspecified severe protein-calorie malnutrition: Secondary | ICD-10-CM | POA: Diagnosis not present

## 2022-04-08 DIAGNOSIS — E46 Unspecified protein-calorie malnutrition: Secondary | ICD-10-CM | POA: Diagnosis not present

## 2022-04-08 DIAGNOSIS — R262 Difficulty in walking, not elsewhere classified: Secondary | ICD-10-CM | POA: Diagnosis not present

## 2022-04-08 DIAGNOSIS — R5381 Other malaise: Secondary | ICD-10-CM | POA: Diagnosis not present

## 2022-04-08 DIAGNOSIS — F5101 Primary insomnia: Secondary | ICD-10-CM | POA: Diagnosis not present

## 2022-04-08 DIAGNOSIS — N390 Urinary tract infection, site not specified: Secondary | ICD-10-CM | POA: Diagnosis not present

## 2022-04-08 DIAGNOSIS — Z9181 History of falling: Secondary | ICD-10-CM | POA: Diagnosis not present

## 2022-04-08 DIAGNOSIS — S90222A Contusion of left lesser toe(s) with damage to nail, initial encounter: Secondary | ICD-10-CM

## 2022-04-08 DIAGNOSIS — F4323 Adjustment disorder with mixed anxiety and depressed mood: Secondary | ICD-10-CM | POA: Diagnosis not present

## 2022-04-08 DIAGNOSIS — G9341 Metabolic encephalopathy: Secondary | ICD-10-CM | POA: Diagnosis not present

## 2022-04-08 DIAGNOSIS — M6281 Muscle weakness (generalized): Secondary | ICD-10-CM | POA: Diagnosis not present

## 2022-04-08 DIAGNOSIS — H903 Sensorineural hearing loss, bilateral: Secondary | ICD-10-CM | POA: Insufficient documentation

## 2022-04-08 DIAGNOSIS — F32A Depression, unspecified: Secondary | ICD-10-CM | POA: Diagnosis not present

## 2022-04-08 NOTE — Progress Notes (Signed)
Subjective:  Patient ID: [name] [name], male    DOB: January 01, 1961,  MRN: [id number] HPI Chief Complaint  Patient presents with   Toe Pain    Hallux left - fell in the shower 2-3 weeks ago, bleeding a lot, now tip of toe red and swollen, facility thought it may have created an ingrown toenail   New Patient (Initial Visit)    Est pt 2017    61 y.o. male presents with the above complaint.   ROS: Denies fever chills nausea vomit muscle aches pains calf pain back pain chest pain shortness of breath.  Past Medical History:  Diagnosis Date   Adenomatous colon polyp 10/1999   Anxiety    Arthritis    neck, yoga helps.   Cataract    Complex partial seizure ([place])    last seizure was 08-30-2018   Crohn's disease of small and large intestines ([place]) 1999   Depression    Diabetes mellitus    no meds at this time 10-24-17   Esophageal stricture    External hemorrhoids    GERD (gastroesophageal reflux disease)    Glaucoma    History of kidney stones    3 large stones still present   Hyperlipemia    Hypertension    resolved with weight loss    Inguinal hernia, bilateral 12/2019   Liver cirrhosis secondary to NASH [facility])    Migraines    Osteoporosis    Pathological fracture of left hip due to osteoporosis ([place]) 12/18/2021   PONV (postoperative nausea and vomiting)    PTSD (post-traumatic stress disorder)    Renal cyst, acquired, left 07/08/2017   Skin cancer    squamous cell multiple; [place]; followed every 3 months.   Sleep apnea    CPAP machine, uses nightly   Staphylococcus aureus bacteremia with sepsis ([place]) 05/28/2017   MRSA 2012   Past Surgical History:  Procedure Laterality Date   CATARACT EXTRACTION Bilateral    COLONOSCOPY     CYSTOSCOPY W/ URETERAL STENT PLACEMENT Right 03/18/2022   Procedure: CYSTOSCOPY WITH RETROGRADE PYELOGRAM, URETEROSCOPY;  Surgeon: Remi Haggard, MD;  Location: [facility];  Service: Urology;  Laterality: Right;   CYSTOSCOPY W/ URETERAL STENT  PLACEMENT Left 03/28/2022   Procedure: CYSTOSCOPY WITH RETROGRADE URETERAL STENT PLACEMENT;  Surgeon: Festus Aloe, MD;  Location: [facility];  Service: Urology;  Laterality: Left;   DEBRIDEMENT MANDIBLE N/A 12/07/2020   Procedure: DEBRIDEMENT OF MANDIBLE;  Surgeon: [name], DMD;  Location: [place];  Service: Oral Surgery;  Laterality: N/A;   ELBOW SURGERY  2012   elbow MRSA infection    EYE SURGERY     cataracts removed, /w IOL   GROSS TEETH DEBRIDEMENT N/A 12/29/2020   Procedure: MANDIBULAR  DEBRIDEMENT;  Surgeon: [name], DMD;  Location: [place];  Service: Dentistry;  Laterality: N/A;   INCISION AND DRAINAGE ABSCESS N/A 12/07/2020   Procedure: INCISION AND DRAINAGE MANDIBULAR ABSCESS;  Surgeon: [name], DMD;  Location: [place];  Service: Oral Surgery;  Laterality: N/A;   INGUINAL HERNIA REPAIR Right 01/14/2020   Procedure: OPEN RIGHT HERNIA REPAIR INGUINAL WITH MESH;  Surgeon: [name], MD;  Location: [facility];  Service: General;  Laterality: Right;   INSERTION OF MESH N/A 07/01/2016   Procedure: INSERTION OF MESH;  Surgeon: [name], MD;  Location: [place];  Service: General;  Laterality: N/A;   INTRAMEDULLARY (IM) NAIL INTERTROCHANTERIC Left 12/17/2021   Procedure: INTRAMEDULLARY (IM) NAIL INTERTROCHANTRIC;  Surgeon: Marcelino Scot,  Legrand Como, MD;  Location: [place];  Service: Orthopedics;  Laterality: Left;   IR URETERAL STENT LEFT NEW ACCESS W/O SEP NEPHROSTOMY CATH  02/02/2018   NEPHROLITHOTOMY Left 02/02/2018   Procedure: NEPHROLITHOTOMY PERCUTANEOUS;  Surgeon: [name], MD;  Location: [facility];  Service: Urology;  Laterality: Left;   POLYPECTOMY     SCALP LACERATION REPAIR Right 10/16/2017   From fall/staples   SHOULDER ARTHROSCOPY Right 03/05/2018   Procedure: ARTHROSCOPY SHOULDER AND OPEN DISTAL CLAVICLE EXCISION;  Surgeon: [name], MD;  Location: [place];  Service: Orthopedics;  Laterality: Right;   SHOULDER SURGERY     SINUS SURGERY WITH INSTATRAK      TEE WITHOUT CARDIOVERSION N/A 05/09/2017   Procedure: TRANSESOPHAGEAL ECHOCARDIOGRAM (TEE);  Surgeon: [name], MD;  Location: [facility];  Service: Cardiovascular;  Laterality: N/A;   TOOTH EXTRACTION N/A 12/07/2020   Procedure: DENTAL EXTRACTIONS OF TEETH TWENTY TO THIRTY;  Surgeon: [name], DMD;  Location: [place];  Service: Oral Surgery;  Laterality: N/A;   TOOTH EXTRACTION N/A 12/29/2020   Procedure: DENTAL RESTORATION/EXTRACTIONS;  Surgeon: [name], DMD;  Location: [place];  Service: Dentistry;  Laterality: N/A;   TRANSTHORACIC ECHOCARDIOGRAM  02/22/2011   EF 55-65%; increased pattern of LVH with mild conc hypertrophy, abnormal relaxation & increased filling pressure (grade 2 diastolic dysfunction); atrial septum thickened (lipomatous hypertrophy)   UMBILICAL HERNIA REPAIR N/A 07/01/2016   Procedure: UMBILICAL HERNIA REPAIR WITH MESH;  Surgeon: [name], MD;  Location: [place];  Service: General;  Laterality: N/A;   UPPER GASTROINTESTINAL ENDOSCOPY     VASECTOMY      Current Outpatient Medications:    acetaminophen (TYLENOL) 500 MG tablet, Take 2 tablets (1,000 mg total) by mouth 3 (three) times daily. (Patient taking differently: Take 1,000 mg by mouth at bedtime.), Disp: 30 tablet, Rfl: 0   acitretin ([name]) 25 MG capsule, Take 25 mg by mouth daily., Disp: , Rfl:    CALCIUM PO, Take 1 tablet by mouth 2 (two) times daily., Disp: , Rfl:    cefdinir (OMNICEF) 300 MG capsule, Take 1 capsule (300 mg total) by mouth every 12 (twelve) hours for 10 days., Disp: 20 capsule, Rfl: 0   cemiplimab-rwlc (LIBTAYO) 350 MG/7ML SOLN injection, Inject into the vein See admin instructions. Every 4-6 weeks, Disp: , Rfl:    chlorhexidine (PERIDEX) 0.12 % solution, Use as directed 15 mLs in the mouth or throat 2 (two) times daily., Disp: , Rfl:    Cholecalciferol (VITAMIN D-3 PO), Take 5,000 Units by mouth daily., Disp: , Rfl:    cycloSPORINE (RESTASIS) 0.05 % ophthalmic emulsion,  Place 1 drop into both eyes 2 (two) times daily as needed (dry eyes)., Disp: , Rfl:    desonide (DESOWEN) 0.05 % ointment, Apply 1 Application topically See admin instructions. Apply to affected area of lower lip 3 times a day as needed for irritation- after mixing with Mupirocin 2% ointment, Disp: , Rfl:    esomeprazole (NEXIUM) 40 MG capsule, TAKE 1 CAPSULE TWICE DAILY BEFORE MEALS, Disp: 180 capsule, Rfl: 0   Eszopiclone 3 MG TABS, Take 3 mg by mouth at bedtime., Disp: , Rfl:    feeding supplement (ENSURE ENLIVE / ENSURE PLUS) LIQD, Take 237 mLs by mouth 2 (two) times daily between meals., Disp: 237 mL, Rfl: 12   folic acid (FOLVITE) 1 MG tablet, Take 2 tablets (2 mg total) by mouth daily., Disp: 180 tablet, Rfl: 3   gabapentin (NEURONTIN) 300 MG capsule, Take  300-600 mg by mouth See admin instructions. Taking 300 mg in the morning and 2 capsules (600 mg) at bedtime, Disp: , Rfl:    hydrocortisone 2.5 % cream, Apply 1 Application topically 2 (two) times daily as needed (Itchy skin)., Disp: , Rfl:    Lactase (DAIRY RELIEF PO), Take 1 tablet by mouth in the morning and at bedtime., Disp: , Rfl:    lamoTRIgine (LAMICTAL) 150 MG tablet, Take 1 tablet (150 mg total) by mouth 2 (two) times daily., Disp: 180 tablet, Rfl: 3   latanoprost (XALATAN) 0.005 % ophthalmic solution, Place 1 drop into both eyes at bedtime. , Disp: , Rfl:    mesalamine (LIALDA) 1.2 g EC tablet, Take 2 tablets (2.4 g total) by mouth 2 (two) times daily. (Patient taking differently: Take 2.4 g by mouth daily.), Disp: 360 tablet, Rfl: 1   methscopolamine (PAMINE FORTE) 5 MG tablet, Take 1 tablet (5 mg total) by mouth 2 (two) times daily., Disp: 180 tablet, Rfl: 1   mupirocin ointment (BACTROBAN) 2 %, Apply 1 Application topically See admin instructions. Apply to affected area of lower lip three times a day as needed for irritation- after mixing with Desonide 0.05% ointment, Disp: , Rfl:    MYRBETRIQ 50 MG TB24 tablet, Take 50 mg by  mouth at bedtime., Disp: , Rfl:    oxyCODONE (OXY IR/ROXICODONE) 5 MG immediate release tablet, Take 1 tablet (5 mg total) by mouth daily as needed for moderate pain or severe pain. (Patient taking differently: Take 5-10 mg by mouth daily as needed for moderate pain or severe pain.), Disp: 15 tablet, Rfl: 0   potassium chloride SA (KLOR-CON) 20 MEQ tablet, Take 1 tablet (20 mEq total) by mouth 2 (two) times daily., Disp: 180 tablet, Rfl: 3   Probiotic Product (PROBIOTIC PO), Take 1 tablet at bedtime by mouth. , Disp: , Rfl:    rifaximin (XIFAXAN) 550 MG TABS tablet, Take 1 tablet (550 mg total) by mouth 2 (two) times daily., Disp: 180 tablet, Rfl: 3   sertraline (ZOLOFT) 100 MG tablet, Take 200 mg daily with breakfast by mouth. , Disp: , Rfl:    tolterodine (DETROL LA) 4 MG 24 hr capsule, Take 4 mg by mouth every evening., Disp: , Rfl:    traZODone (DESYREL) 150 MG tablet, Take 300 mg by mouth at bedtime., Disp: , Rfl:  No current facility-administered medications for this visit.  Facility-Administered Medications Ordered in Other Visits:    gadopentetate dimeglumine (MAGNEVIST) injection 20 mL, 20 mL, Intravenous, Once PRN, Marcial Pacas, MD  Allergies  Allergen Reactions   Sulfonamide Derivatives Hives and Rash   Z-Pak [Azithromycin] Swelling   Dilaudid [Hydromorphone Hcl] Nausea And Vomiting   Sulfa Antibiotics Hives and Rash   Review of Systems Objective:  There were no vitals filed for this visit.  General: Well developed, nourished, in no acute distress, alert and oriented x3   Dermatological: Skin is warm, dry and supple bilateral. Nails x 10 are well maintained; remaining integument appears unremarkable at this time. There are no open sores, no preulcerative lesions, no rash or signs of infection present.  Bruising and an incurvated nail margin along the tibial fibular border of the hallux left.  There appears to be some dried blood on the margin of the nail.  The nail was not loose  and does not demonstrate any ecchymosis beneath the nail plate.  There is no purulence no malodor.  Vascular: Dorsalis Pedis artery and Posterior Tibial artery pedal pulses  are 2/4 bilateral with immedate capillary fill time. Pedal hair growth present. No varicosities and no lower extremity edema present bilateral.   Neruologic: Grossly intact via light touch bilateral. Vibratory intact via tuning fork bilateral. Protective threshold with Semmes Wienstein monofilament intact to all pedal sites bilateral. Patellar and Achilles deep tendon reflexes 2+ bilateral. No Babinski or clonus noted bilateral.   Musculoskeletal: No gross boney pedal deformities bilateral. No pain, crepitus, or limitation noted with foot and ankle range of motion bilateral. Muscular strength 5/5 in all groups tested bilateral.  Gait: Unassisted, Nonantalgic.    Radiographs:  None taken  Assessment & Plan:   Assessment: Ingrown toenail secondary to trauma tibial-fibular border hallux left  Plan: Discussed etiology pathology conservative surgical therapies performed a I&D today with partial nail avulsions tibia and fibular border.  He was given both oral and home-going instructions for care and soaking of the foot and I will follow-up with him in a couple of weeks.     Kery Haltiwanger T. [place], [place]

## 2022-04-08 NOTE — Anesthesia Postprocedure Evaluation (Signed)
Anesthesia Post Note  Patient: [name] [name]  Procedure(s) Performed: CYSTOSCOPY WITH RETROGRADE URETERAL STENT PLACEMENT (Left)     Patient location during evaluation: PACU Anesthesia Type: General Level of consciousness: awake and alert Pain management: pain level controlled Vital Signs Assessment: post-procedure vital signs reviewed and stable Respiratory status: spontaneous breathing, nonlabored ventilation, respiratory function stable and patient connected to nasal cannula oxygen Cardiovascular status: blood pressure returned to baseline and stable Postop Assessment: no apparent nausea or vomiting Anesthetic complications: no   No notable events documented.  Last Vitals:  Vitals:   04/01/22 0844 04/01/22 1357  BP: 110/68 124/71  Pulse: 69 78  Resp: 18 18  Temp: 36.8 C 36.8 C  SpO2: 97% 100%    Last Pain:  Vitals:   04/01/22 1357  TempSrc: Oral  PainSc:                  Mickael Mcnutt S

## 2022-04-08 NOTE — Patient Instructions (Signed)

## 2022-04-09 ENCOUNTER — Ambulatory Visit: Payer: 59 | Admitting: Neurology

## 2022-04-09 DIAGNOSIS — B370 Candidal stomatitis: Secondary | ICD-10-CM | POA: Diagnosis not present

## 2022-04-09 DIAGNOSIS — K7460 Unspecified cirrhosis of liver: Secondary | ICD-10-CM | POA: Diagnosis not present

## 2022-04-09 DIAGNOSIS — H5462 Unqualified visual loss, left eye, normal vision right eye: Secondary | ICD-10-CM | POA: Diagnosis not present

## 2022-04-09 DIAGNOSIS — N201 Calculus of ureter: Secondary | ICD-10-CM | POA: Diagnosis not present

## 2022-04-09 DIAGNOSIS — Z960 Presence of urogenital implants: Secondary | ICD-10-CM | POA: Diagnosis not present

## 2022-04-09 DIAGNOSIS — G9341 Metabolic encephalopathy: Secondary | ICD-10-CM | POA: Diagnosis not present

## 2022-04-09 DIAGNOSIS — C220 Liver cell carcinoma: Secondary | ICD-10-CM | POA: Diagnosis not present

## 2022-04-09 DIAGNOSIS — N1330 Unspecified hydronephrosis: Secondary | ICD-10-CM | POA: Diagnosis not present

## 2022-04-09 DIAGNOSIS — R319 Hematuria, unspecified: Secondary | ICD-10-CM | POA: Diagnosis not present

## 2022-04-10 ENCOUNTER — Non-Acute Institutional Stay: Payer: 59 | Admitting: Internal Medicine

## 2022-04-10 ENCOUNTER — Other Ambulatory Visit: Payer: Self-pay | Admitting: *Deleted

## 2022-04-10 DIAGNOSIS — Z515 Encounter for palliative care: Secondary | ICD-10-CM

## 2022-04-10 DIAGNOSIS — C220 Liver cell carcinoma: Secondary | ICD-10-CM | POA: Diagnosis not present

## 2022-04-10 NOTE — Patient Outreach (Signed)
[facility] Post- Acute Care Coordinator follow up. Mr. Mccathern resides in [place].   Update received from [place], SNF SW, indicating dc care plan meeting is scheduled in 2 weeks. Will transition home with spouse post SNF.   Palliative following.   Marthenia Rolling, MSN, RN,BSN [place] Acute Care Coordinator [phone] (Direct dial)

## 2022-04-10 NOTE — Progress Notes (Signed)
[facility] Palliative Care Consult Note Telephone: [phone]  Fax: [phone]   Date of encounter: 04/10/22 11:03 AM PATIENT NAME: [name] [name] [place] [id number]   [phone] (home)  DOB: 04-25-1961 MRN: [id number] PRIMARY CARE PROVIDER:    [name], MD,  [address] [place] [address] [phone]  REFERRING PROVIDER:   [name], MD [place],  [place] [address] [phone]  RESPONSIBLE PARTY:    Contact Information     Name Relation Home Work Mobile   [place] Spouse [phone]  [phone]   [name], [name] Daughter [phone]  [phone]        I met face to face with patient and family in [facility] facility. Palliative Care was asked to follow this patient by consultation request of  [name] Pepper, MD to address advance care planning and complex medical decision making. This is the initial visit.                                     ASSESSMENT AND PLAN / RECOMMENDATIONS:   Advance Care Planning/Goals of Care: Goals include to maximize quality of life and symptom management. Patient/health care surrogate gave his/her permission to discuss.Our advance care planning conversation included a discussion about:    The value and importance of advance care planning  Experiences with loved ones who have been seriously ill or have died  Exploration of personal, cultural or spiritual beliefs that might influence medical decisions  Exploration of goals of care in the event of a sudden injury or illness  Identification  of a healthcare agent  Review and updating or creation of an  advance directive document . Decision not to resuscitate or to de-escalate disease focused treatments due to poor prognosis. CODE STATUS:  MOST on file reviewed:  full code, full scope, abx if indicated, IVF if indicated, feeding tube for defined trial period  Symptom  Management/Plan: 1. Hepatocellular carcinoma ([place]) -await results of MRI, pt not really able to take more treatments as no longer helping  -function has improved with therapy\  2. Palliative care encounter -MOST remains for aggressive care -his wife just had surgery so I opted not to call her today, but we will need to f/u  Follow up Palliative Care Visit: Palliative care will continue to follow for complex medical decision making, advance care planning, and clarification of goals.  This visit was coded based on medical decision making (MDM).   PPS: 50%  HOSPICE ELIGIBILITY/DIAGNOSIS: hepatocellular ca--yes, but pt not ready  Chief Complaint: Palliative care f/u in facility  HISTORY OF PRESENT ILLNESS:  [name] [name] is a 61 y.o. year old male  with hepatocellular carcinoma, hepatic cirrhosis, DMII, complex partial seizures, OSA on CPAP, iron deficiency anemia and thrombocytopenia, left ureteral obstruction, crohn's disease, and admission 10/5-9 with sepsis with acute end organ damage during which a left ureteral stent was placed after empiric abx with vanc and cefepime.  He had leftsided calculus with moderate left hydronephrosis prior.  He had improvement.  He was discharged on omnicef for 10 more days and to f/u with urology outpatient.   Per NP who was following him in ALF, pt had recent rehospitalization with recurrent renal calculi and hydronephrosis with subsequent development of UTI and urosepsis.  Urology placed a ureteral stent during hospitalization and pt was weak so he was sent  to [facility] for rehab.  His wife [name] [name] states that they have an adult special needs son that lives with them and she is having surgery next week.  She is meeting with the facility doc who is serving as pt's attending currently to discuss his plan of care and length of stay.  She would like for him to stay until she is home from having her surgery and they have additional help.  Pt has NASH, developed  non-alcoholic cirrhosis and subsequently Hepatocellular carcinoma.  He has been receiving chemotherapy and in that time his liver lesion has increased from 3 to 10 cm on most recent scan.  Pt has not wanted to discuss his prognosis but she did ask the doctor outside the room who indicated that if tumor has really grown that much he may survive 2-3 months.  They may try a different chemotherapy but he will go to [facility] to the Oncologist there who will repeat the scan to confirm if the growth has happened as described.  She states he then told her that they might be able to try a different chemotherapy but if growth that substantial they may discuss Hospice as well.  When seen, he reports doing physically better than at admission.  He has his MRI next week.  His chemo/med is not helping and tumor is growing.  He reports he is down about 120 lbs from baseline of 275 lbs.  He historically walked 5-7 miles per day before "all this mess".  He broke his hip 3 mos ago.  He also participated in competitive football and baseball.  He worked in Engineer, technical sales at [employer] and went to [employer].  He and his wife have a 37 yo special needs child who is blind and deaf.  His wife just had a mastectomy Monday and their [place] from the beach.  Pt reports he has no bottom teeth after a staph infection that required surgery.  He had no intake for 2 wks then.  He says he has a family h/o addiction so he tries to avoid situations where he would need more opioid therapy.  He has a urology f/u tomorrow.  He's still having hematuria.  He doesn't even recall his condition when he came here.  He is using a walker and has a rollator at home.  He eats bites to max 1/2 his plate--diet is mechanical soft.  He ate 75% of his cheese sandwich today.  Drinks 3-4 ensures per day.  He has pain in his right side--ribs and scapula on right side down to his femur.  HIs right hand is weak.  He has crohn's so he often has numerous bms per week--at the most 8.   He takes a sleep med that works.  History obtained from review of EMR, discussion with primary team, and interview with family, facility staff/caregiver and/or Mr. Balderston.   I reviewed available labs, medications, imaging, studies and related documents from the EMR.  Records reviewed and summarized above.   ROS  Review of Systems  Constitutional:  Positive for activity change, appetite change, fatigue and unexpected weight change. Negative for fever.  Eyes:  Negative for visual disturbance.  Respiratory:  Negative for chest tightness and shortness of breath.   Cardiovascular:  Negative for leg swelling.  Gastrointestinal:  Positive for abdominal pain and diarrhea. Negative for constipation.  Genitourinary:  Negative for dysuria.  Skin:  Positive for pallor.  Neurological:        Poor  small hand coordination    Physical Exam: There were no vitals filed for this visit. There is no height or weight on file to calculate BMI. Wt Readings from Last 500 Encounters:  03/29/22 153 lb 7 oz (69.6 kg)  03/18/22 145 lb (65.8 kg)  12/17/21 158 lb 11.7 oz (72 kg)  12/14/21 160 lb 8 oz (72.8 kg)  10/15/21 166 lb (75.3 kg)  10/12/21 160 lb (72.6 kg)  08/03/21 173 lb 2 oz (78.5 kg)  07/12/21 172 lb 1.6 oz (78.1 kg)  06/04/21 175 lb 2 oz (79.4 kg)  05/08/21 177 lb 8 oz (80.5 kg)  04/03/21 178 lb (80.7 kg)  01/31/21 177 lb 9.6 oz (80.6 kg)  01/14/21 180 lb (81.6 kg)  12/29/20 170 lb (77.1 kg)  12/14/20 175 lb 14.4 oz (79.8 kg)  12/04/20 179 lb 14.3 oz (81.6 kg)  11/25/20 179 lb 14.3 oz (81.6 kg)  11/23/20 180 lb (81.6 kg)  09/08/20 187 lb 6.3 oz (85 kg)  07/03/20 187 lb 6.4 oz (85 kg)  03/20/20 186 lb 3.2 oz (84.5 kg)  01/14/20 188 lb 15 oz (85.7 kg)  01/11/20 189 lb (85.7 kg)  01/11/20 188 lb (85.3 kg)  12/02/19 183 lb (83 kg)  09/15/19 190 lb (86.2 kg)  07/27/19 190 lb (86.2 kg)  07/12/19 190 lb (86.2 kg)  07/01/19 192 lb 12.8 oz (87.5 kg)  01/08/19 185 lb 9.6 oz (84.2 kg)   12/28/18 191 lb 6.4 oz (86.8 kg)  11/17/18 194 lb (88 kg)  08/31/18 188 lb (85.3 kg)  07/15/18 194 lb (88 kg)  07/09/18 194 lb 6.4 oz (88.2 kg)  04/24/18 199 lb (90.3 kg)  04/07/18 211 lb (95.7 kg)  03/05/18 200 lb (90.7 kg)  02/18/18 210 lb (95.3 kg)  02/02/18 203 lb 11.3 oz (92.4 kg)  01/26/18 203 lb 9.6 oz (92.4 kg)  01/05/18 195 lb (88.5 kg)  12/17/17 196 lb (88.9 kg)  12/16/17 196 lb (88.9 kg)  11/23/17 195 lb (88.5 kg)  10/27/17 195 lb (88.5 kg)  10/24/17 197 lb (89.4 kg)  10/21/17 197 lb 2 oz (89.4 kg)  10/16/17 195 lb (88.5 kg)  10/07/17 202 lb 6.4 oz (91.8 kg)  08/27/17 200 lb 8 oz (90.9 kg)  07/29/17 200 lb (90.7 kg)  07/26/17 203 lb 6.4 oz (92.3 kg)  06/25/17 208 lb (94.3 kg)  06/23/17 203 lb (92.1 kg)  06/04/17 200 lb (90.7 kg)  05/28/17 202 lb (91.6 kg)  05/23/17 200 lb (90.7 kg)  05/14/17 200 lb (90.7 kg)  05/09/17 202 lb (91.6 kg)  05/05/17 200 lb (90.7 kg)  04/02/17 209 lb (94.8 kg)  03/11/17 212 lb (96.2 kg)  02/18/17 212 lb (96.2 kg)  02/11/17 212 lb (96.2 kg)  11/19/16 217 lb (98.4 kg)  10/22/16 221 lb 9.6 oz (100.5 kg)  08/30/16 223 lb (101.2 kg)  08/20/16 219 lb 6.4 oz (99.5 kg)  07/01/16 226 lb (102.5 kg)  06/27/16 226 lb 14.4 oz (102.9 kg)  05/15/16 228 lb 9.6 oz (103.7 kg)  05/14/16 230 lb (104.3 kg)  05/07/16 231 lb 2 oz (104.8 kg)  04/30/16 232 lb 12.8 oz (105.6 kg)  11/14/15 228 lb (103.4 kg)  10/31/15 228 lb (103.4 kg)  10/30/15 227 lb (103 kg)  10/23/15 227 lb 9.6 oz (103.2 kg)  06/03/15 229 lb (103.9 kg)  04/25/15 226 lb 6.4 oz (102.7 kg)  02/16/15 224 lb (101.6 kg)  01/23/15 220 lb (99.8 kg)  01/09/15  224 lb 8 oz (101.8 kg)  10/27/14 225 lb 3.2 oz (102.2 kg)  10/06/14 224 lb 12.8 oz (102 kg)  09/13/14 227 lb 4 oz (103.1 kg)  09/04/14 227 lb (103 kg)  08/28/14 227 lb 3.2 oz (103.1 kg)  07/12/14 224 lb 6.4 oz (101.8 kg)  07/07/14 229 lb 9.6 oz (104.1 kg)  05/09/14 224 lb 4 oz (101.7 kg)  02/16/14 221 lb (100.2 kg)  12/12/13  218 lb (98.9 kg)  12/01/13 223 lb (101.2 kg)  11/24/13 223 lb 6.4 oz (101.3 kg)  10/14/13 228 lb (103.4 kg)  10/06/13 228 lb (103.4 kg)  09/30/13 228 lb (103.4 kg)  08/25/13 225 lb 9.6 oz (102.3 kg)  08/23/13 224 lb (101.6 kg)  08/12/13 226 lb (102.5 kg)  07/01/13 228 lb (103.4 kg)  05/03/13 239 lb 11.2 oz (108.7 kg)  04/07/13 250 lb (113.4 kg)  04/02/13 246 lb 9.6 oz (111.9 kg)  03/17/13 246 lb (111.6 kg)  03/12/13 246 lb 6.4 oz (111.8 kg)  02/14/13 254 lb (115.2 kg)  01/26/13 255 lb 12.8 oz (116 kg)  01/24/13 249 lb (112.9 kg)  01/21/13 245 lb 9.6 oz (111.4 kg)  01/19/13 252 lb 6.4 oz (114.5 kg)  01/12/13 255 lb 3.2 oz (115.8 kg)  01/04/13 250 lb (113.4 kg)  12/31/12 260 lb (117.9 kg)  12/23/12 257 lb (116.6 kg)  12/20/12 253 lb 9.6 oz (115 kg)  12/09/12 258 lb (117 kg)  11/16/12 258 lb (117 kg)  11/09/12 257 lb 3.2 oz (116.7 kg)  11/05/12 258 lb (117 kg)  10/29/12 250 lb (113.4 kg)  10/29/12 258 lb (117 kg)  10/02/12 257 lb 4 oz (116.7 kg)  10/01/12 255 lb (115.7 kg)  09/29/12 256 lb (116.1 kg)  08/12/12 258 lb 6 oz (117.2 kg)  07/26/12 250 lb (113.4 kg)  07/24/12 254 lb (115.2 kg)  05/04/12 255 lb 3.2 oz (115.8 kg)  04/19/12 255 lb (115.7 kg)  02/26/12 257 lb (116.6 kg)  02/21/12 250 lb (113.4 kg)  02/01/12 259 lb (117.5 kg)  01/28/12 262 lb 4.8 oz (119 kg)  01/15/12 262 lb 6.4 oz (119 kg)  10/23/11 261 lb 3.2 oz (118.5 kg)  10/16/11 253 lb 12.8 oz (115.1 kg)  10/11/11 265 lb (120.2 kg)  10/09/11 263 lb (119.3 kg)  10/02/11 260 lb (117.9 kg)  09/29/11 255 lb 12.8 oz (116 kg)  09/25/11 257 lb 0.9 oz (116.6 kg)  09/15/11 239 lb (108.4 kg)  08/27/11 250 lb (113.4 kg)  08/18/11 261 lb (118.4 kg)  08/16/11 255 lb (115.7 kg)  08/16/11 262 lb (118.8 kg)  08/15/11 267 lb (121.1 kg)  08/06/11 264 lb 3.2 oz (119.8 kg)  06/13/11 266 lb (120.7 kg)  06/12/11 263 lb 9.6 oz (119.6 kg)  06/06/11 261 lb (118.4 kg)  05/21/11 263 lb (119.3 kg)  08/20/10 (!) 272 lb  (123.4 kg)  08/17/10 (!) 274 lb (124.3 kg)  07/23/10 (!) 269 lb 11.2 oz (122.3 kg)   Physical Exam Vitals reviewed.  Constitutional:      Appearance: He is ill-appearing.     Comments: Cachectic male  HENT:     Head: Normocephalic and atraumatic.     Comments: Uses neck brace to support neck Cardiovascular:     Rate and Rhythm: Normal rate and regular rhythm.  Pulmonary:     Effort: Pulmonary effort is normal.     Breath sounds: Normal breath sounds.  Abdominal:     General:  Bowel sounds are normal.     Tenderness: There is no abdominal tenderness. There is no guarding or rebound.  Neurological:     Mental Status: He is alert.  Psychiatric:        Mood and Affect: Mood normal.    CURRENT PROBLEM LIST:  Patient Active Problem List   Diagnosis Date Noted   Bilateral sensorineural hearing loss 04/08/2022   Crohn's disease ([place]) 32/35/5732   Acute metabolic encephalopathy 20/25/4270   Thrombocytopenia ([place]) 03/29/2022   Ureteral obstruction, left 03/28/2022   Hepatocellular carcinoma ([place]) 03/25/2022   Cirrhosis of liver without ascites ([place])    Urinary tract infection without hematuria    Sepsis secondary to UTI ([place]) 03/17/2022   Hyperbilirubinemia 03/17/2022   Hydronephrosis of right kidney 03/17/2022   Nephrolith 03/17/2022   High risk medication use 02/19/2022   Protein-calorie malnutrition, severe 12/19/2021   Pathological fracture of left hip due to osteoporosis ([place]) 12/18/2021   Closed nondisplaced intertrochanteric fracture of left femur, initial encounter ([place]) 12/17/2021   Rib fracture 12/17/2021   Liver lesion 10/08/2021   Alteration in speech 10/08/2021   Squamous cell carcinoma in situ (SCCIS) of skin of right cheek 07/18/2021   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 05/11/2021   SCC (squamous cell carcinoma) 03/21/2021   Partial optic atrophy of both eyes 03/16/2021   Pharyngeal dysphagia 02/22/2021   Liver cirrhosis secondary to NASH with  liver mass     GERD (gastroesophageal reflux disease)    Pancytopenia (HCC)    Osteomyelitis of jaw 12/29/2020   Acute osteomyelitis of mandible 12/26/2020   Sepsis with acute end organ damage 12/04/2020   S/P inguinal hernia repair 01/14/2020   Psychogenic nonepileptic seizure 09/01/2019   At high risk for falls    S/P shoulder surgery 03/05/2018   Excessive daytime sleepiness 02/18/2018   Crohn's disease of colon with rectal bleeding ([place]) 02/18/2018   Iron deficiency anemia due to chronic blood loss and thrombocytopenia  02/18/2018   Iron deficiency anemia due to sideropenic dysphagia 02/18/2018   Hematochezia 02/18/2018   Renal calculus, left 02/02/2018   History of sepsis 12/16/2017   Renal cyst, acquired, left 07/08/2017   Frequent falls 07/08/2017   Renal lesion 05/28/2017   MSSA bacteremia 05/06/2017   Depression 04/30/2016   Memory loss 04/30/2016   OSA on CPAP 10/31/2015   Nonepileptic episode ([place]) 04/25/2015   Gait abnormality 12/14/2014   DOE (dyspnea on exertion) 03/12/2013   Complex partial seizures 04/19/2012   DM2 (diabetes mellitus, type 2) ([place]) 09/25/2011   OTHER DYSPHAGIA 03/27/2009   GERD 12/07/2007   EXTERNAL HEMORRHOIDS 09/11/2007   CROHN'S DISEASE, LARGE AND SMALL INTESTINES 09/11/2007   OSTEOPOROSIS 09/11/2007   PAST MEDICAL HISTORY:  Active Ambulatory Problems    Diagnosis Date Noted   EXTERNAL HEMORRHOIDS 09/11/2007   GERD 12/07/2007   CROHN'S DISEASE, LARGE AND SMALL INTESTINES 09/11/2007   OSTEOPOROSIS 09/11/2007   OTHER DYSPHAGIA 03/27/2009   DM2 (diabetes mellitus, type 2) ([place]) 09/25/2011   Complex partial seizures 04/19/2012   DOE (dyspnea on exertion) 03/12/2013   Nonepileptic episode ([place]) 04/25/2015   OSA on CPAP 10/31/2015   Depression 04/30/2016   Memory loss 04/30/2016   MSSA bacteremia 05/06/2017   Renal lesion 05/28/2017   Renal cyst, acquired, left 07/08/2017   Frequent falls 07/08/2017   History of sepsis 12/16/2017    Renal calculus, left 02/02/2018   Excessive daytime sleepiness 02/18/2018   Crohn's disease of colon with rectal bleeding (  [place]) 02/18/2018   Iron deficiency anemia due to chronic blood loss and thrombocytopenia  02/18/2018   Iron deficiency anemia due to sideropenic dysphagia 02/18/2018   S/P shoulder surgery 03/05/2018   At high risk for falls    S/P inguinal hernia repair 01/14/2020   Sepsis with acute end organ damage 12/04/2020   Acute osteomyelitis of mandible 12/26/2020   Osteomyelitis of jaw 12/29/2020   Liver cirrhosis secondary to NASH with liver mass     GERD (gastroesophageal reflux disease)    Pancytopenia ([place])    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 05/11/2021   Closed nondisplaced intertrochanteric fracture of left femur, initial encounter ([place]) 12/17/2021   Rib fracture 12/17/2021   Pathological fracture of left hip due to osteoporosis ([place]) 12/18/2021   Protein-calorie malnutrition, severe 12/19/2021   Sepsis secondary to UTI ([place]) 03/17/2022   Hyperbilirubinemia 03/17/2022   Hydronephrosis of right kidney 03/17/2022   Nephrolith 03/17/2022   Cirrhosis of liver without ascites ([place])    Urinary tract infection without hematuria    Hepatocellular carcinoma ([place]) 03/25/2022   Ureteral obstruction, left 03/28/2022   Crohn's disease ([place]) 25/63/8937   Acute metabolic encephalopathy 34/28/7681   Thrombocytopenia ([place]) 03/29/2022   Bilateral sensorineural hearing loss 04/08/2022   Gait abnormality 12/14/2014   Hematochezia 02/18/2018   High risk medication use 02/19/2022   Liver lesion 10/08/2021   Partial optic atrophy of both eyes 03/16/2021   Pharyngeal dysphagia 02/22/2021   Psychogenic nonepileptic seizure 09/01/2019   SCC (squamous cell carcinoma) 03/21/2021   Alteration in speech 10/08/2021   Squamous cell carcinoma in situ (SCCIS) of skin of right cheek 07/18/2021   Resolved Ambulatory Problems    Diagnosis Date Noted   Hyperlipidemia 02/27/2007    Essential hypertension 02/27/2007   Cough 11/10/2008   TRANSAMINASES, SERUM, ELEVATED 03/27/2009   COLONIC POLYPS, ADENOMATOUS, HX OF 12/07/2007   PRE-DIABETES 07/23/2010   Altered mental status 09/25/2011   Obstructive sleep apnea 10/01/2012   Chest pain 03/12/2013   Abnormal EKG 03/12/2013   Syncope 08/30/2016   Confusion 04/02/2017   Bacteremia due to Staphylococcus aureus 05/06/2017   Staphylococcus aureus bacteremia with sepsis ([place]) 05/28/2017   Therapeutic drug monitoring 10/07/2017   Fatigue 12/16/2017   Blood in stool 02/18/2018   Facial cellulitis 12/05/2020   Past Medical History:  Diagnosis Date   Adenomatous colon polyp 10/1999   Anxiety    Arthritis    Cataract    Crohn's disease of small and large intestines ([place]) 1999   Diabetes mellitus    Esophageal stricture    External hemorrhoids    Glaucoma    History of kidney stones    Hyperlipemia    Hypertension    Inguinal hernia, bilateral 12/2019   Migraines    Osteoporosis    PONV (postoperative nausea and vomiting)    PTSD (post-traumatic stress disorder)    Skin cancer    Sleep apnea    SOCIAL HX:  Social History   Tobacco Use   Smoking status: Never    Passive exposure: Never   Smokeless tobacco: Never  Substance Use Topics   Alcohol use: Never   FAMILY HX:  Family History  Problem Relation Age of Onset   Heart disease Mother        CABG at age 58   Hyperlipidemia Mother    Hypertension Mother    Diabetes Father    Colon polyps Father    Stroke Father    Hyperlipidemia Father  Hypertension Father    Stroke Paternal Grandmother    Stroke Paternal Grandfather    Other Child        tetrology of fallot (cornealia deland syndrome)   Colon cancer Neg Hx    Esophageal cancer Neg Hx    Stomach cancer Neg Hx    Rectal cancer Neg Hx       ALLERGIES:  Allergies  Allergen Reactions   Sulfonamide Derivatives Hives and Rash   Z-Pak [Azithromycin] Swelling   Dilaudid [Hydromorphone  Hcl] Nausea And Vomiting   Sulfa Antibiotics Hives and Rash      PERTINENT MEDICATIONS:  Outpatient Encounter Medications as of 04/10/2022  Medication Sig   acetaminophen (TYLENOL) 500 MG tablet Take 2 tablets (1,000 mg total) by mouth 3 (three) times daily. (Patient taking differently: Take 1,000 mg by mouth at bedtime.)   acitretin ([name]) 25 MG capsule Take 25 mg by mouth daily.   CALCIUM PO Take 1 tablet by mouth 2 (two) times daily.   cefdinir (OMNICEF) 300 MG capsule Take 1 capsule (300 mg total) by mouth every 12 (twelve) hours for 10 days.   cemiplimab-rwlc (LIBTAYO) 350 MG/7ML SOLN injection Inject into the vein See admin instructions. Every 4-6 weeks   chlorhexidine (PERIDEX) 0.12 % solution Use as directed 15 mLs in the mouth or throat 2 (two) times daily.   Cholecalciferol (VITAMIN D-3 PO) Take 5,000 Units by mouth daily.   cycloSPORINE (RESTASIS) 0.05 % ophthalmic emulsion Place 1 drop into both eyes 2 (two) times daily as needed (dry eyes).   desonide (DESOWEN) 0.05 % ointment Apply 1 Application topically See admin instructions. Apply to affected area of lower lip 3 times a day as needed for irritation- after mixing with Mupirocin 2% ointment   esomeprazole (NEXIUM) 40 MG capsule TAKE 1 CAPSULE TWICE DAILY BEFORE MEALS   Eszopiclone 3 MG TABS Take 3 mg by mouth at bedtime.   feeding supplement (ENSURE ENLIVE / ENSURE PLUS) LIQD Take 237 mLs by mouth 2 (two) times daily between meals.   folic acid (FOLVITE) 1 MG tablet Take 2 tablets (2 mg total) by mouth daily.   gabapentin (NEURONTIN) 300 MG capsule Take 300-600 mg by mouth See admin instructions. Taking 300 mg in the morning and 2 capsules (600 mg) at bedtime   hydrocortisone 2.5 % cream Apply 1 Application topically 2 (two) times daily as needed (Itchy skin).   Lactase (DAIRY RELIEF PO) Take 1 tablet by mouth in the morning and at bedtime.   lamoTRIgine (LAMICTAL) 150 MG tablet Take 1 tablet (150 mg total) by mouth 2  (two) times daily.   latanoprost (XALATAN) 0.005 % ophthalmic solution Place 1 drop into both eyes at bedtime.    mesalamine (LIALDA) 1.2 g EC tablet Take 2 tablets (2.4 g total) by mouth 2 (two) times daily. (Patient taking differently: Take 2.4 g by mouth daily.)   methscopolamine (PAMINE FORTE) 5 MG tablet Take 1 tablet (5 mg total) by mouth 2 (two) times daily.   mupirocin ointment (BACTROBAN) 2 % Apply 1 Application topically See admin instructions. Apply to affected area of lower lip three times a day as needed for irritation- after mixing with Desonide 0.05% ointment   MYRBETRIQ 50 MG TB24 tablet Take 50 mg by mouth at bedtime.   oxyCODONE (OXY IR/ROXICODONE) 5 MG immediate release tablet Take 1 tablet (5 mg total) by mouth daily as needed for moderate pain or severe pain. (Patient taking differently: Take 5-10 mg by mouth daily  as needed for moderate pain or severe pain.)   potassium chloride SA (KLOR-CON) 20 MEQ tablet Take 1 tablet (20 mEq total) by mouth 2 (two) times daily.   Probiotic Product (PROBIOTIC PO) Take 1 tablet at bedtime by mouth.    rifaximin (XIFAXAN) 550 MG TABS tablet Take 1 tablet (550 mg total) by mouth 2 (two) times daily.   sertraline (ZOLOFT) 100 MG tablet Take 200 mg daily with breakfast by mouth.    tolterodine (DETROL LA) 4 MG 24 hr capsule Take 4 mg by mouth every evening.   traZODone (DESYREL) 150 MG tablet Take 300 mg by mouth at bedtime.   Facility-Administered Encounter Medications as of 04/10/2022  Medication   gadopentetate dimeglumine (MAGNEVIST) injection 20 mL    Thank you for the opportunity to participate in the care of Mr. Walder.  The palliative care team will continue to follow. Please call our office at [phone] if we can be of additional assistance.   Hollace Kinnier, DO  COVID-19 PATIENT SCREENING TOOL Asked and negative response unless otherwise noted:  Have you had symptoms of covid, tested positive or been in contact with someone  with symptoms/positive test in the past 5-10 days?  NO

## 2022-04-11 DIAGNOSIS — R5381 Other malaise: Secondary | ICD-10-CM | POA: Diagnosis not present

## 2022-04-11 DIAGNOSIS — K5090 Crohn's disease, unspecified, without complications: Secondary | ICD-10-CM | POA: Diagnosis not present

## 2022-04-11 DIAGNOSIS — R52 Pain, unspecified: Secondary | ICD-10-CM | POA: Diagnosis not present

## 2022-04-11 DIAGNOSIS — M6281 Muscle weakness (generalized): Secondary | ICD-10-CM | POA: Diagnosis not present

## 2022-04-11 DIAGNOSIS — N390 Urinary tract infection, site not specified: Secondary | ICD-10-CM | POA: Diagnosis not present

## 2022-04-11 DIAGNOSIS — E46 Unspecified protein-calorie malnutrition: Secondary | ICD-10-CM | POA: Diagnosis not present

## 2022-04-11 DIAGNOSIS — G9341 Metabolic encephalopathy: Secondary | ICD-10-CM | POA: Diagnosis not present

## 2022-04-11 DIAGNOSIS — Z9181 History of falling: Secondary | ICD-10-CM | POA: Diagnosis not present

## 2022-04-11 DIAGNOSIS — E43 Unspecified severe protein-calorie malnutrition: Secondary | ICD-10-CM | POA: Diagnosis not present

## 2022-04-11 DIAGNOSIS — R262 Difficulty in walking, not elsewhere classified: Secondary | ICD-10-CM | POA: Diagnosis not present

## 2022-04-11 DIAGNOSIS — E119 Type 2 diabetes mellitus without complications: Secondary | ICD-10-CM | POA: Diagnosis not present

## 2022-04-12 ENCOUNTER — Ambulatory Visit: Payer: Medicare Other | Admitting: Physical Therapy

## 2022-04-15 ENCOUNTER — Encounter: Payer: Medicare Other | Admitting: Physical Therapy

## 2022-04-15 DIAGNOSIS — N130 Hydronephrosis with ureteropelvic junction obstruction: Secondary | ICD-10-CM | POA: Diagnosis not present

## 2022-04-15 DIAGNOSIS — F32A Depression, unspecified: Secondary | ICD-10-CM | POA: Diagnosis not present

## 2022-04-15 DIAGNOSIS — F5101 Primary insomnia: Secondary | ICD-10-CM | POA: Diagnosis not present

## 2022-04-15 DIAGNOSIS — N202 Calculus of kidney with calculus of ureter: Secondary | ICD-10-CM | POA: Diagnosis not present

## 2022-04-16 DIAGNOSIS — F5101 Primary insomnia: Secondary | ICD-10-CM | POA: Diagnosis not present

## 2022-04-16 DIAGNOSIS — F32A Depression, unspecified: Secondary | ICD-10-CM | POA: Diagnosis not present

## 2022-04-16 DIAGNOSIS — F4323 Adjustment disorder with mixed anxiety and depressed mood: Secondary | ICD-10-CM | POA: Diagnosis not present

## 2022-04-17 DIAGNOSIS — H401234 Low-tension glaucoma, bilateral, indeterminate stage: Secondary | ICD-10-CM | POA: Diagnosis not present

## 2022-04-18 ENCOUNTER — Encounter: Payer: Medicare Other | Admitting: Rehabilitative and Restorative Service Providers"

## 2022-04-18 DIAGNOSIS — E119 Type 2 diabetes mellitus without complications: Secondary | ICD-10-CM | POA: Diagnosis not present

## 2022-04-18 DIAGNOSIS — R52 Pain, unspecified: Secondary | ICD-10-CM | POA: Diagnosis not present

## 2022-04-18 DIAGNOSIS — E46 Unspecified protein-calorie malnutrition: Secondary | ICD-10-CM | POA: Diagnosis not present

## 2022-04-18 DIAGNOSIS — N390 Urinary tract infection, site not specified: Secondary | ICD-10-CM | POA: Diagnosis not present

## 2022-04-18 DIAGNOSIS — Z9181 History of falling: Secondary | ICD-10-CM | POA: Diagnosis not present

## 2022-04-18 DIAGNOSIS — K5090 Crohn's disease, unspecified, without complications: Secondary | ICD-10-CM | POA: Diagnosis not present

## 2022-04-18 DIAGNOSIS — R262 Difficulty in walking, not elsewhere classified: Secondary | ICD-10-CM | POA: Diagnosis not present

## 2022-04-18 DIAGNOSIS — M6281 Muscle weakness (generalized): Secondary | ICD-10-CM | POA: Diagnosis not present

## 2022-04-18 DIAGNOSIS — R5381 Other malaise: Secondary | ICD-10-CM | POA: Diagnosis not present

## 2022-04-18 DIAGNOSIS — E43 Unspecified severe protein-calorie malnutrition: Secondary | ICD-10-CM | POA: Diagnosis not present

## 2022-04-18 DIAGNOSIS — G9341 Metabolic encephalopathy: Secondary | ICD-10-CM | POA: Diagnosis not present

## 2022-04-19 DIAGNOSIS — E119 Type 2 diabetes mellitus without complications: Secondary | ICD-10-CM | POA: Diagnosis not present

## 2022-04-19 DIAGNOSIS — R161 Splenomegaly, not elsewhere classified: Secondary | ICD-10-CM | POA: Diagnosis not present

## 2022-04-19 DIAGNOSIS — C220 Liver cell carcinoma: Secondary | ICD-10-CM | POA: Diagnosis not present

## 2022-04-19 DIAGNOSIS — Z792 Long term (current) use of antibiotics: Secondary | ICD-10-CM | POA: Diagnosis not present

## 2022-04-19 DIAGNOSIS — K766 Portal hypertension: Secondary | ICD-10-CM | POA: Diagnosis not present

## 2022-04-19 DIAGNOSIS — I10 Essential (primary) hypertension: Secondary | ICD-10-CM | POA: Diagnosis not present

## 2022-04-19 DIAGNOSIS — Z882 Allergy status to sulfonamides status: Secondary | ICD-10-CM | POA: Diagnosis not present

## 2022-04-19 DIAGNOSIS — K7030 Alcoholic cirrhosis of liver without ascites: Secondary | ICD-10-CM | POA: Diagnosis not present

## 2022-04-21 ENCOUNTER — Encounter: Payer: Self-pay | Admitting: Internal Medicine

## 2022-04-22 ENCOUNTER — Encounter: Payer: Medicare Other | Admitting: Physical Therapy

## 2022-04-23 DIAGNOSIS — D61818 Other pancytopenia: Secondary | ICD-10-CM | POA: Diagnosis not present

## 2022-04-23 DIAGNOSIS — H35033 Hypertensive retinopathy, bilateral: Secondary | ICD-10-CM | POA: Diagnosis not present

## 2022-04-23 DIAGNOSIS — M810 Age-related osteoporosis without current pathological fracture: Secondary | ICD-10-CM | POA: Diagnosis not present

## 2022-04-23 DIAGNOSIS — H409 Unspecified glaucoma: Secondary | ICD-10-CM | POA: Diagnosis not present

## 2022-04-24 ENCOUNTER — Other Ambulatory Visit: Payer: Self-pay | Admitting: Urology

## 2022-04-24 DIAGNOSIS — E119 Type 2 diabetes mellitus without complications: Secondary | ICD-10-CM | POA: Diagnosis not present

## 2022-04-24 DIAGNOSIS — E43 Unspecified severe protein-calorie malnutrition: Secondary | ICD-10-CM | POA: Diagnosis not present

## 2022-04-24 DIAGNOSIS — K769 Liver disease, unspecified: Secondary | ICD-10-CM | POA: Diagnosis not present

## 2022-04-24 DIAGNOSIS — N390 Urinary tract infection, site not specified: Secondary | ICD-10-CM | POA: Diagnosis not present

## 2022-04-24 DIAGNOSIS — K7460 Unspecified cirrhosis of liver: Secondary | ICD-10-CM | POA: Diagnosis not present

## 2022-04-24 DIAGNOSIS — M6281 Muscle weakness (generalized): Secondary | ICD-10-CM | POA: Diagnosis not present

## 2022-04-24 DIAGNOSIS — K5090 Crohn's disease, unspecified, without complications: Secondary | ICD-10-CM | POA: Diagnosis not present

## 2022-04-24 DIAGNOSIS — G9341 Metabolic encephalopathy: Secondary | ICD-10-CM | POA: Diagnosis not present

## 2022-04-24 DIAGNOSIS — R5381 Other malaise: Secondary | ICD-10-CM | POA: Diagnosis not present

## 2022-04-24 DIAGNOSIS — R52 Pain, unspecified: Secondary | ICD-10-CM | POA: Diagnosis not present

## 2022-04-25 ENCOUNTER — Encounter: Payer: Medicare Other | Admitting: Rehabilitative and Restorative Service Providers"

## 2022-04-26 DIAGNOSIS — Z09 Encounter for follow-up examination after completed treatment for conditions other than malignant neoplasm: Secondary | ICD-10-CM | POA: Diagnosis not present

## 2022-04-26 DIAGNOSIS — R319 Hematuria, unspecified: Secondary | ICD-10-CM | POA: Diagnosis not present

## 2022-04-26 DIAGNOSIS — N135 Crossing vessel and stricture of ureter without hydronephrosis: Secondary | ICD-10-CM | POA: Diagnosis not present

## 2022-04-26 DIAGNOSIS — N201 Calculus of ureter: Secondary | ICD-10-CM | POA: Diagnosis not present

## 2022-04-26 DIAGNOSIS — Z960 Presence of urogenital implants: Secondary | ICD-10-CM | POA: Diagnosis not present

## 2022-04-26 DIAGNOSIS — Z87448 Personal history of other diseases of urinary system: Secondary | ICD-10-CM | POA: Diagnosis not present

## 2022-04-29 ENCOUNTER — Telehealth: Payer: Self-pay | Admitting: Family Medicine

## 2022-04-29 NOTE — Telephone Encounter (Signed)
Wife called end of last week, left message wanting to alert to latest situation and potential for d/c this week from [facility].  She states pt cannot walk unassisted currently but will occasionally try to show he is doing better.  Had a recent fall under these circumstances.  Recent abd CT showed worsening of hepatocellular carcinoma but he follows with [facility] and had an abd MRI which showed hepatic lesions that were ablated are consistent with post ablation changes and not new growth of cancer.  She states pt also has hx of head and neck cancer.  He is coming home with a hospital bed and bedside commode which she is going to set up downstairs for his ease and hopes that he doesn't try to go upstairs on his own.  He continues to have muscle wasting and weight loss that the Oncologist from [facility] indicated was likely due to his cirrhosis.  Wife asked for more information on stages of cirrhosis.  Explained that usually with decompensated cirrhosis is liver failure and he may have elevated bilirubin resulting in jaundice of skin and eyes, albumin could get really low leading to 3rd spacing of fluid with ascites that could make him SOB and have difficulty breathing as well as peripheral edema.  He could experience disruption of his sleep-wake cycle with increased somnolence and potentially confusion if ammonia high enough.  Pt is on Xifaxan which is supposed to help control ammonia levels.  Advised that LFTs are likely to get worse as will hematologic pancytopenia, particularly PLT count putting him at increased risk of hemorrhage. She has recently undergone lumpectomy with partial node dissection and will require 20 radiation treatments.  They also have a special needs son who lives upstairs in their home.  She is wanting to see what she will need but may want to have private pay resources on hand to allow her some time to run errands.  Their son has a caregiver who helps out.  She wants to try and  keep pt from climbing the stairs independently and taking the risk of falling.  She states Oncologist has indicated that his cancer is not the primary concern at this point, that it is more likely from other insults or cirrhosis that he is currently at risk.  They are not ready to talk about Hospice at this point but goal is to have him home for the holidays. Advised of changes in Palliative Program with clinician led teams doing home visits.  She plans to call when pt is set up for d/c to schedule a follow up visit within a few days.  Damaris Hippo FNP-C

## 2022-04-30 ENCOUNTER — Encounter: Payer: Self-pay | Admitting: Podiatry

## 2022-04-30 ENCOUNTER — Ambulatory Visit (INDEPENDENT_AMBULATORY_CARE_PROVIDER_SITE_OTHER): Payer: 59 | Admitting: Podiatry

## 2022-04-30 ENCOUNTER — Encounter: Payer: Medicare Other | Admitting: Rehabilitative and Restorative Service Providers"

## 2022-04-30 DIAGNOSIS — N201 Calculus of ureter: Secondary | ICD-10-CM | POA: Diagnosis not present

## 2022-04-30 DIAGNOSIS — Z960 Presence of urogenital implants: Secondary | ICD-10-CM | POA: Diagnosis not present

## 2022-04-30 DIAGNOSIS — L03031 Cellulitis of right toe: Secondary | ICD-10-CM

## 2022-04-30 DIAGNOSIS — R319 Hematuria, unspecified: Secondary | ICD-10-CM | POA: Diagnosis not present

## 2022-04-30 DIAGNOSIS — F32A Depression, unspecified: Secondary | ICD-10-CM | POA: Diagnosis not present

## 2022-04-30 DIAGNOSIS — K5090 Crohn's disease, unspecified, without complications: Secondary | ICD-10-CM | POA: Diagnosis not present

## 2022-04-30 NOTE — Progress Notes (Signed)
He presents today for nail check of his left foot and an ingrown toenail to the tibial border of the right foot.  Objective: Vital signs stable he is alert and oriented x3 presents in wheelchair today with his wife his left great toe appears to be have gone on to heal uneventfully the right hallux tibial border does demonstrate some mild erythema pulses remain palpable.  No abrasions or open wounds are noted.  He does have some dry xerotic skin possibly from a drug reaction on his hands and on his feet.  Assessment: Ingrown nail tibial border hallux right well-healing surgical foot left.  Plan: I&D was performed today tibial border hallux right tolerated procedure well after local anesthetic was provided he was given both oral and written instruction for care and second toe as well as a prescription for Cortisporin Otic to be applied twice daily after soaking.  Follow-up with him in about 3 weeks to make sure he is healing well.

## 2022-04-30 NOTE — Patient Instructions (Signed)

## 2022-05-01 ENCOUNTER — Ambulatory Visit: Payer: Medicare Other | Admitting: Gastroenterology

## 2022-05-02 ENCOUNTER — Other Ambulatory Visit: Payer: Self-pay | Admitting: *Deleted

## 2022-05-02 DIAGNOSIS — R030 Elevated blood-pressure reading, without diagnosis of hypertension: Secondary | ICD-10-CM | POA: Diagnosis not present

## 2022-05-02 NOTE — Patient Outreach (Signed)
[place] Coordinator follow up. Per [name] Mr. Brackins remains in [facility].  Screening for care coordination services as benefit of insurance plan and PCP.   PCP office [facility] has [facility] care management.  Update received from [place], SNF social worker indicating dc care plan meeting scheduled for next week. Also followed by [facility] palliative. Anticipated transition plan is to return home with spouse.   Will plan notification to [facility] care management upon SNF discharge.   Will continue to follow.    Marthenia Rolling, MSN, RN,BSN [place] Acute Care Coordinator [phone] (Direct dial)

## 2022-05-07 DIAGNOSIS — Z9181 History of falling: Secondary | ICD-10-CM | POA: Diagnosis not present

## 2022-05-07 DIAGNOSIS — K5090 Crohn's disease, unspecified, without complications: Secondary | ICD-10-CM | POA: Diagnosis not present

## 2022-05-07 DIAGNOSIS — N390 Urinary tract infection, site not specified: Secondary | ICD-10-CM | POA: Diagnosis not present

## 2022-05-07 DIAGNOSIS — E119 Type 2 diabetes mellitus without complications: Secondary | ICD-10-CM | POA: Diagnosis not present

## 2022-05-07 DIAGNOSIS — R52 Pain, unspecified: Secondary | ICD-10-CM | POA: Diagnosis not present

## 2022-05-07 DIAGNOSIS — M6281 Muscle weakness (generalized): Secondary | ICD-10-CM | POA: Diagnosis not present

## 2022-05-07 DIAGNOSIS — R5381 Other malaise: Secondary | ICD-10-CM | POA: Diagnosis not present

## 2022-05-07 DIAGNOSIS — E43 Unspecified severe protein-calorie malnutrition: Secondary | ICD-10-CM | POA: Diagnosis not present

## 2022-05-07 DIAGNOSIS — G9341 Metabolic encephalopathy: Secondary | ICD-10-CM | POA: Diagnosis not present

## 2022-05-08 DIAGNOSIS — C4492 Squamous cell carcinoma of skin, unspecified: Secondary | ICD-10-CM | POA: Diagnosis not present

## 2022-05-09 ENCOUNTER — Telehealth: Payer: Self-pay | Admitting: Medical Oncology

## 2022-05-09 DIAGNOSIS — E43 Unspecified severe protein-calorie malnutrition: Secondary | ICD-10-CM | POA: Diagnosis not present

## 2022-05-09 DIAGNOSIS — E119 Type 2 diabetes mellitus without complications: Secondary | ICD-10-CM | POA: Diagnosis not present

## 2022-05-09 DIAGNOSIS — K5090 Crohn's disease, unspecified, without complications: Secondary | ICD-10-CM | POA: Diagnosis not present

## 2022-05-09 DIAGNOSIS — Z9181 History of falling: Secondary | ICD-10-CM | POA: Diagnosis not present

## 2022-05-09 DIAGNOSIS — R5381 Other malaise: Secondary | ICD-10-CM | POA: Diagnosis not present

## 2022-05-09 DIAGNOSIS — G9341 Metabolic encephalopathy: Secondary | ICD-10-CM | POA: Diagnosis not present

## 2022-05-09 DIAGNOSIS — R52 Pain, unspecified: Secondary | ICD-10-CM | POA: Diagnosis not present

## 2022-05-09 DIAGNOSIS — M6281 Muscle weakness (generalized): Secondary | ICD-10-CM | POA: Diagnosis not present

## 2022-05-09 DIAGNOSIS — N390 Urinary tract infection, site not specified: Secondary | ICD-10-CM | POA: Diagnosis not present

## 2022-05-09 NOTE — Telephone Encounter (Signed)
Nurse navigator at [facility] said [name] [name]  last Libtayo ( #7) was October 4th.  Pt transferring his Immunotherapy treatment here . He will see Julien Nordmann next week and get tx.

## 2022-05-09 NOTE — Telephone Encounter (Signed)
[facility] navigator requests app with [place].   Pt want to get Cemiplimab infusions locally. Schedule message sent for appts next week.

## 2022-05-12 NOTE — Progress Notes (Signed)
Patient's wife called and asked if we could do a phone interview because he was too weak to come in person. He was going to go to his PCP this morning and she would have them draw his preop labs. Naida Sleight gave her the list of needed blood work (CBC, CMP, A1C) and [name] [name] voiced understanding and agreement to getting blood work at PCP.    Telephone PST interview: The patient was identified using 2 approved identifiers. All issues noted in this document were discussed and addressed with both [name] [name] and his wife [name] [name]. Both voiced understanding and agreement with all preoperative instructions. The patient was emailed the surgery instructions per his request to [email or web address].        COVID Vaccine received:  _1  No _2  Yes Date of any COVID positive Test in last 90 days: none  PCP - [name], MD at [facility] Cardiologist - none  ENT surgeon- Dr. Vallarie Mare  Hematology/oncology;  Dr.  Tobie Poet and Dr. Ronne Binning, MD [address]  [place],  [address]  Phone: [phone]  Fax: [phone]   Chest x-ray - 03-28-2022 EKG -  04-01-22 Stress Test -  ECHO - 05-09-2017 Cardiac Cath -  CT chest w/contrast- 03-22-2022  CE  PCR screen: _3  Ordered & Completed                      _4   No Order but Needs PROFEND                      _5   N/A for this surgery  Surgery Plan:  _6  Ambulatory                            _7  Outpatient in bed                            _8  Admit  Anesthesia:    _9  General  _10  Spinal                           _11   Choice _12   MAC  Bowel Prep - _13  No  _14   Yes _____________  Pacemaker / ICD device _15  No _16  Yes        Device order form faxed _17  No    _18   Yes      Faxed to:  Spinal Cord Stimulator:_19  No _20  Yes      (Remind patient to bring remote DOS) Other Implants:   History of Sleep Apnea? _21  No _22  Yes   CPAP used?- _23  No not used anymore     Does the patient monitor blood sugar? _24  No  _25  Yes  _26  N/A Does patient have a [employer] or Dexacom? _27  No _28  Yes   Fasting Blood Sugar Ranges-  Checks Blood Sugar __0   times a day  Blood Thinner / Instructions:  none Aspirin Instructions:  ERAS Protocol Ordered: _29  No  _30  Yes  Patient is to be NPO after: Midnight prior  Comments: currently being seen in [facility] for oncology but he is in the process of transferring to Dr. Julien Nordmann here at [place].   Patient resides at [facility] right now and they will be transporting him to [place] for surgery per his wife, [name] [name].   Activity level: Patient  can not climb a flight of stairs without difficulty; _0  No CP  _1  No SOB, but would have balance problems and weakness in legs.  Patient can not perform ADLs without assistance. Patient lives a [place] right now in rehab.   Anesthesia review: Cirrhosis, Liver Ca, HTN, PONV, DM2 (no Meds), Thrombocytopenia, anemia, complex partial seizures (last 08-30-2018 controlled with meds),   Patient denies shortness of breath, fever, cough and chest pain at PAT appointment.  Patient verbalized understanding and agreement to the Pre-Surgical Instructions that were given to them at this PAT appointment. Patient was also educated of the need to review these PAT instructions again prior to his/her surgery.I reviewed the appropriate phone numbers to call if they have any and questions or concerns.

## 2022-05-12 NOTE — Patient Instructions (Signed)
SURGICAL WAITING ROOM VISITATION Patients having surgery or a procedure may have no more than 2 support people in the waiting area - these visitors may rotate in the visitor waiting room.   Children under the age of 44 must have an adult with them who is not the patient. If the patient needs to stay at the hospital during part of their recovery, the visitor guidelines for inpatient rooms apply.  PRE-OP VISITATION  Pre-op nurse will coordinate an appropriate time for 1 support person to accompany the patient in pre-op.  This support person may not rotate.  This visitor will be contacted when the time is appropriate for the visitor to come back in the pre-op area.  Please refer to the [facility] website for the visitor guidelines for Inpatients (after your surgery is over and you are in a regular room).  You are not required to quarantine at this time prior to your surgery. However, you must do this: Hand Hygiene often Do NOT share personal items Notify your provider if you are in close contact with someone who has COVID or you develop fever 100.4 or greater, new onset of sneezing, cough, sore throat, shortness of breath or body aches.  If you test positive for Covid or have been in contact with anyone that has tested positive in the last 10 days please notify you surgeon.    Your procedure is scheduled on: May 21, 2022   Report to [facility] Main Entrance: [place] entrance where the [employer] is available.   Report to admitting at: 09:15  AM  +++++Call this number if you have any questions or problems the morning of surgery [phone]  DO NOT EAT OR DRINK ANYTHING AFTER MIDNIGHT THE NIGHT PRIOR TO YOUR SURGERY / PROCEDURE.   FOLLOW  ANY ADDITIONAL PRE OP INSTRUCTIONS YOU RECEIVED FROM YOUR SURGEON'S OFFICE!!!   Oral Hygiene is also important to reduce your risk of infection.        Remember - BRUSH YOUR TEETH THE MORNING OF SURGERY WITH YOUR REGULAR  TOOTHPASTE   Take ONLY these medicines the morning of surgery with A SIP OF WATER: Tylenol, Gabapentin, Lamotrigine (Lamictal), sertraline (Zoloft), rifaximin (Xifaxan)   If You have been diagnosed with Sleep Apnea - Bring CPAP mask and tubing day of surgery. We will provide you with a CPAP machine on the day of your surgery.                   You may not have any metal on your body including jewelry, and body piercing  Do not wear  lotions, powders, cologne, or deodorant  Men may shave face and neck.  Contacts, Hearing Aids, dentures or bridgework may not be worn into surgery.   Patients discharged on the day of surgery will not be allowed to drive home.  Someone NEEDS to stay with you for the first 24 hours after anesthesia.  Do not bring your home medications to the hospital. The Pharmacy will dispense medications listed on your medication list to you during your admission in the Hospital.  Special Instructions: Bring a copy of your healthcare power of attorney and living will documents the day of surgery, if you wish to have them scanned into your [place] Medical Records- EPIC  Please read over the following fact sheets you were given: IF YOU HAVE QUESTIONS ABOUT YOUR PRE-OP INSTRUCTIONS, PLEASE CALL [phone]  Zigmund Daniel)   [place] - Preparing for Surgery Before surgery, you can play  an important role.  Because skin is not sterile, your skin needs to be as free of germs as possible.  You can reduce the number of germs on your skin by washing with CHG (chlorahexidine gluconate) soap before surgery.  CHG is an antiseptic cleaner which kills germs and bonds with the skin to continue killing germs even after washing. Please DO NOT use if you have an allergy to CHG or antibacterial soaps.  If your skin becomes reddened/irritated stop using the CHG and inform your nurse when you arrive at Short Stay. Do not shave (including legs and underarms) for at least 48 hours prior to the first  CHG shower.  You may shave your face/neck.  Please follow these instructions carefully:  1.  Shower with CHG Soap the night before surgery and the  morning of surgery.  2.  If you choose to wash your hair, wash your hair first as usual with your normal  shampoo.  3.  After you shampoo, rinse your hair and body thoroughly to remove the shampoo.                             4.  Use CHG as you would any other liquid soap.  You can apply chg directly to the skin and wash.  Gently with a scrungie or clean washcloth.  5.  Apply the CHG Soap to your body ONLY FROM THE NECK DOWN.   Do not use on face/ open                           Wound or open sores. Avoid contact with eyes, ears mouth and genitals (private parts).                       Wash face,  Genitals (private parts) with your normal soap.             6.  Wash thoroughly, paying special attention to the area where your  surgery  will be performed.  7.  Thoroughly rinse your body with warm water from the neck down.  8.  DO NOT shower/wash with your normal soap after using and rinsing off the CHG Soap.            9.  Pat yourself dry with a clean towel.            10.  Wear clean pajamas.            11.  Place clean sheets on your bed the night of your first shower and do not  sleep with pets.  ON THE DAY OF SURGERY : Do not apply any lotions/deodorants the morning of surgery.  Please wear clean clothes to the hospital/surgery center.    FAILURE TO FOLLOW THESE INSTRUCTIONS MAY RESULT IN THE CANCELLATION OF YOUR SURGERY  PATIENT SIGNATURE_________________________________  NURSE SIGNATURE__________________________________  ________________________________________________________________________

## 2022-05-13 ENCOUNTER — Telehealth: Payer: Self-pay | Admitting: Medical Oncology

## 2022-05-13 NOTE — Telephone Encounter (Signed)
LVM for [name] [name]. Per  Dr. Julien Nordmann   "He needs to get one more cycle there. It is a short notice to start him soon. I can see first week of December and then take over " .

## 2022-05-14 ENCOUNTER — Encounter (HOSPITAL_COMMUNITY)
Admission: RE | Admit: 2022-05-14 | Discharge: 2022-05-14 | Disposition: A | Discharge status: Home / Self Care | Payer: Medicare Other | Source: Ambulatory Visit | Attending: Urology | Admitting: Urology

## 2022-05-14 ENCOUNTER — Other Ambulatory Visit (HOSPITAL_COMMUNITY): Payer: Self-pay | Admitting: Hematology and Oncology

## 2022-05-14 ENCOUNTER — Encounter: Payer: Self-pay | Admitting: Hematology and Oncology

## 2022-05-14 ENCOUNTER — Other Ambulatory Visit: Payer: Self-pay | Admitting: *Deleted

## 2022-05-14 ENCOUNTER — Encounter (HOSPITAL_COMMUNITY): Payer: Self-pay

## 2022-05-14 DIAGNOSIS — R159 Full incontinence of feces: Secondary | ICD-10-CM | POA: Diagnosis not present

## 2022-05-14 DIAGNOSIS — Z960 Presence of urogenital implants: Secondary | ICD-10-CM | POA: Diagnosis not present

## 2022-05-14 DIAGNOSIS — C220 Liver cell carcinoma: Secondary | ICD-10-CM | POA: Diagnosis not present

## 2022-05-14 DIAGNOSIS — C44329 Squamous cell carcinoma of skin of other parts of face: Secondary | ICD-10-CM

## 2022-05-14 DIAGNOSIS — I10 Essential (primary) hypertension: Secondary | ICD-10-CM

## 2022-05-14 DIAGNOSIS — M4810 Ankylosing hyperostosis [Forestier], site unspecified: Secondary | ICD-10-CM | POA: Diagnosis not present

## 2022-05-14 DIAGNOSIS — R1310 Dysphagia, unspecified: Secondary | ICD-10-CM | POA: Diagnosis not present

## 2022-05-14 DIAGNOSIS — K766 Portal hypertension: Secondary | ICD-10-CM | POA: Diagnosis not present

## 2022-05-14 DIAGNOSIS — C801 Malignant (primary) neoplasm, unspecified: Secondary | ICD-10-CM | POA: Diagnosis not present

## 2022-05-14 DIAGNOSIS — K5090 Crohn's disease, unspecified, without complications: Secondary | ICD-10-CM | POA: Diagnosis not present

## 2022-05-14 DIAGNOSIS — R1110 Vomiting, unspecified: Secondary | ICD-10-CM | POA: Diagnosis not present

## 2022-05-14 DIAGNOSIS — K7460 Unspecified cirrhosis of liver: Secondary | ICD-10-CM | POA: Diagnosis not present

## 2022-05-14 DIAGNOSIS — E119 Type 2 diabetes mellitus without complications: Secondary | ICD-10-CM

## 2022-05-14 DIAGNOSIS — K769 Liver disease, unspecified: Secondary | ICD-10-CM

## 2022-05-14 HISTORY — DX: Anemia, unspecified: D64.9

## 2022-05-14 NOTE — Patient Outreach (Signed)
Per [facility] Mr. Stille resides in [facility]. Screening for care coordination services as benefit of insurance plan and PCP.   Secure communication sent to SNF social worker to inquire about transition plan/date.   PCP office [facility] has [facility] care management.  Will continue to follow.   Marthenia Rolling, MSN, RN,BSN [place] Acute Care Coordinator [phone] (Direct dial)

## 2022-05-15 ENCOUNTER — Other Ambulatory Visit (HOSPITAL_COMMUNITY): Payer: Self-pay | Admitting: Hematology and Oncology

## 2022-05-15 DIAGNOSIS — E119 Type 2 diabetes mellitus without complications: Secondary | ICD-10-CM | POA: Diagnosis not present

## 2022-05-15 DIAGNOSIS — G9341 Metabolic encephalopathy: Secondary | ICD-10-CM | POA: Diagnosis not present

## 2022-05-15 DIAGNOSIS — Z9181 History of falling: Secondary | ICD-10-CM | POA: Diagnosis not present

## 2022-05-15 DIAGNOSIS — E43 Unspecified severe protein-calorie malnutrition: Secondary | ICD-10-CM | POA: Diagnosis not present

## 2022-05-15 DIAGNOSIS — N390 Urinary tract infection, site not specified: Secondary | ICD-10-CM | POA: Diagnosis not present

## 2022-05-15 DIAGNOSIS — C44329 Squamous cell carcinoma of skin of other parts of face: Secondary | ICD-10-CM

## 2022-05-15 DIAGNOSIS — C220 Liver cell carcinoma: Secondary | ICD-10-CM

## 2022-05-15 DIAGNOSIS — K5090 Crohn's disease, unspecified, without complications: Secondary | ICD-10-CM | POA: Diagnosis not present

## 2022-05-15 DIAGNOSIS — R5381 Other malaise: Secondary | ICD-10-CM | POA: Diagnosis not present

## 2022-05-15 DIAGNOSIS — R52 Pain, unspecified: Secondary | ICD-10-CM | POA: Diagnosis not present

## 2022-05-15 DIAGNOSIS — M6281 Muscle weakness (generalized): Secondary | ICD-10-CM | POA: Diagnosis not present

## 2022-05-19 ENCOUNTER — Other Ambulatory Visit: Payer: Self-pay | Admitting: Gastroenterology

## 2022-05-20 ENCOUNTER — Emergency Department (HOSPITAL_COMMUNITY): Payer: Medicare Other

## 2022-05-20 ENCOUNTER — Emergency Department (HOSPITAL_COMMUNITY)
Admission: EM | Admit: 2022-05-20 | Discharge: 2022-05-20 | Disposition: A | Discharge status: Skilled Nursing Facility | Payer: Medicare Other | Attending: Student | Admitting: Student

## 2022-05-20 ENCOUNTER — Other Ambulatory Visit: Payer: Self-pay

## 2022-05-20 ENCOUNTER — Encounter: Payer: Self-pay | Admitting: Internal Medicine

## 2022-05-20 DIAGNOSIS — W19XXXA Unspecified fall, initial encounter: Secondary | ICD-10-CM | POA: Insufficient documentation

## 2022-05-20 DIAGNOSIS — M25552 Pain in left hip: Secondary | ICD-10-CM | POA: Diagnosis not present

## 2022-05-20 DIAGNOSIS — M545 Low back pain, unspecified: Secondary | ICD-10-CM | POA: Diagnosis not present

## 2022-05-20 DIAGNOSIS — E119 Type 2 diabetes mellitus without complications: Secondary | ICD-10-CM | POA: Insufficient documentation

## 2022-05-20 DIAGNOSIS — F32A Depression, unspecified: Secondary | ICD-10-CM | POA: Diagnosis not present

## 2022-05-20 DIAGNOSIS — I10 Essential (primary) hypertension: Secondary | ICD-10-CM | POA: Diagnosis not present

## 2022-05-20 DIAGNOSIS — S0083XA Contusion of other part of head, initial encounter: Secondary | ICD-10-CM | POA: Insufficient documentation

## 2022-05-20 DIAGNOSIS — K5090 Crohn's disease, unspecified, without complications: Secondary | ICD-10-CM | POA: Diagnosis not present

## 2022-05-20 DIAGNOSIS — G9341 Metabolic encephalopathy: Secondary | ICD-10-CM | POA: Diagnosis not present

## 2022-05-20 DIAGNOSIS — T1490XA Injury, unspecified, initial encounter: Secondary | ICD-10-CM | POA: Diagnosis not present

## 2022-05-20 DIAGNOSIS — S32030A Wedge compression fracture of third lumbar vertebra, initial encounter for closed fracture: Secondary | ICD-10-CM | POA: Diagnosis not present

## 2022-05-20 DIAGNOSIS — H409 Unspecified glaucoma: Secondary | ICD-10-CM | POA: Diagnosis not present

## 2022-05-20 DIAGNOSIS — Z85828 Personal history of other malignant neoplasm of skin: Secondary | ICD-10-CM | POA: Insufficient documentation

## 2022-05-20 DIAGNOSIS — C220 Liver cell carcinoma: Secondary | ICD-10-CM | POA: Diagnosis not present

## 2022-05-20 DIAGNOSIS — S0003XA Contusion of scalp, initial encounter: Secondary | ICD-10-CM | POA: Diagnosis not present

## 2022-05-20 DIAGNOSIS — R748 Abnormal levels of other serum enzymes: Secondary | ICD-10-CM | POA: Diagnosis not present

## 2022-05-20 DIAGNOSIS — M810 Age-related osteoporosis without current pathological fracture: Secondary | ICD-10-CM | POA: Diagnosis not present

## 2022-05-20 DIAGNOSIS — Y92129 Unspecified place in nursing home as the place of occurrence of the external cause: Secondary | ICD-10-CM | POA: Insufficient documentation

## 2022-05-20 DIAGNOSIS — M1612 Unilateral primary osteoarthritis, left hip: Secondary | ICD-10-CM | POA: Diagnosis not present

## 2022-05-20 DIAGNOSIS — S79912A Unspecified injury of left hip, initial encounter: Secondary | ICD-10-CM | POA: Diagnosis not present

## 2022-05-20 DIAGNOSIS — S0990XA Unspecified injury of head, initial encounter: Secondary | ICD-10-CM | POA: Diagnosis present

## 2022-05-20 DIAGNOSIS — D696 Thrombocytopenia, unspecified: Secondary | ICD-10-CM | POA: Diagnosis not present

## 2022-05-20 DIAGNOSIS — S72142D Displaced intertrochanteric fracture of left femur, subsequent encounter for closed fracture with routine healing: Secondary | ICD-10-CM | POA: Diagnosis not present

## 2022-05-20 LAB — URINALYSIS, MICROSCOPIC (REFLEX): RBC / HPF: >50 RBC/hpf (ref 0–5)

## 2022-05-20 LAB — COMPREHENSIVE METABOLIC PANEL
ALT: 36 U/L (ref 0–44)
AST: 51 U/L — ABNORMAL HIGH (ref 15–41)
Albumin: 2.7 g/dL — ABNORMAL LOW (ref 3.5–5.0)
Alkaline Phosphatase: 277 U/L — ABNORMAL HIGH (ref 38–126)
Anion gap: 10 (ref 5–15)
BUN: 9 mg/dL (ref 6–20)
CO2: 23 mmol/L (ref 22–32)
Calcium: 8.7 mg/dL — ABNORMAL LOW (ref 8.9–10.3)
Chloride: 108 mmol/L (ref 98–111)
Creatinine, Ser: 0.83 mg/dL (ref 0.61–1.24)
GFR, Estimated: >60 mL/min (ref >60)
Glucose, Bld: 72 mg/dL (ref 70–99)
Potassium: 3.7 mmol/L (ref 3.5–5.1)
Sodium: 141 mmol/L (ref 135–145)
Total Bilirubin: 0.7 mg/dL (ref 0.3–1.2)
Total Protein: 5 g/dL — ABNORMAL LOW (ref 6.5–8.1)

## 2022-05-20 LAB — URINALYSIS, ROUTINE W REFLEX MICROSCOPIC
Glucose, UA: NEGATIVE mg/dL
Ketones, ur: NEGATIVE mg/dL
Nitrite: NEGATIVE
Protein, ur: 100 mg/dL — AB
Specific Gravity, Urine: 1.025 (ref 1.005–1.030)
pH: 6.5 (ref 5.0–8.0)

## 2022-05-20 LAB — CBC
HCT: 32.7 % — ABNORMAL LOW (ref 39.0–52.0)
Hemoglobin: 10.9 g/dL — ABNORMAL LOW (ref 13.0–17.0)
MCH: 30.4 pg (ref 26.0–34.0)
MCHC: 33.3 g/dL (ref 30.0–36.0)
MCV: 91.3 fL (ref 80.0–100.0)
Platelets: 106 10*3/uL — ABNORMAL LOW (ref 150–400)
RBC: 3.58 MIL/uL — ABNORMAL LOW (ref 4.22–5.81)
RDW: 17 % — ABNORMAL HIGH (ref 11.5–15.5)
WBC: 5.1 10*3/uL (ref 4.0–10.5)
nRBC: 0 % (ref 0.0–0.2)

## 2022-05-20 LAB — PROTIME-INR
INR: 1.2 (ref 0.8–1.2)
Prothrombin Time: 15.5 seconds — ABNORMAL HIGH (ref 11.4–15.2)

## 2022-05-20 MED ORDER — OXYCODONE HCL 5 MG PO TABS: 5.0000 mg | ORAL_TABLET | ORAL | 0 refills | Status: AC | PRN

## 2022-05-20 MED ORDER — OXYCODONE-ACETAMINOPHEN 5-325 MG PO TABS
1.0000 | ORAL_TABLET | Freq: Once | ORAL | Status: AC
  Administered 2022-05-20: 1 via ORAL
  Filled 2022-05-20: qty 1

## 2022-05-20 MED ORDER — FENTANYL CITRATE PF 50 MCG/ML IJ SOSY
50.0000 ug | PREFILLED_SYRINGE | Freq: Once | INTRAMUSCULAR | Status: AC
  Administered 2022-05-20: 50 ug via INTRAVENOUS
  Filled 2022-05-20: qty 1

## 2022-05-20 MED ORDER — LIDOCAINE 5 % EX PTCH
1.0000 | MEDICATED_PATCH | CUTANEOUS | Status: DC
  Administered 2022-05-20: 1 via TRANSDERMAL
  Filled 2022-05-20: qty 1

## 2022-05-20 MED ORDER — PROCHLORPERAZINE EDISYLATE 10 MG/2ML IJ SOLN
10.0000 mg | Freq: Once | INTRAMUSCULAR | Status: AC
  Administered 2022-05-20: 10 mg via INTRAVENOUS
  Filled 2022-05-20: qty 2

## 2022-05-20 MED ORDER — DIPHENHYDRAMINE HCL 50 MG/ML IJ SOLN
25.0000 mg | Freq: Once | INTRAMUSCULAR | Status: AC
  Administered 2022-05-20: 25 mg via INTRAVENOUS
  Filled 2022-05-20: qty 1

## 2022-05-20 NOTE — ED Notes (Signed)
Pt verbalizes understanding of discharge instructions. Opportunity for questions and answers were provided. Pt discharged from the ED to [facility] via [place].

## 2022-05-20 NOTE — H&P (Signed)
Has infusionHPI: [name] [name] is a 61 year-old male established patient who is here for renal calculi.   The problem is on the left side. He has had no symptoms. This is not his first kidney stone. He has not caught a stone in his urine strainer since his symptoms began.   He has had ESWL for treatment of his stones in the past. This condition would be considered of mild to moderate severity with no modifying factors or associated signs or symptoms other than as noted above.   Left renal calculi: CT scan done in 3/12 revealed 2 stones within the left kidney that were peripherally located and not resulting in any obstruction.  CT scan 5/14 - single stone seen in the periphery of the left kidney, no right renal calculi and a single cyst in the left kidney.  CT scan 11/16 - 4 left renal calculi and no right renal calculi.   08/24/19: He underwent a left PCNL in 8/19. He said since then he has done well without any flank pain or hematuria to suggest passage of a stone. His KUB after the procedure revealed few small fragments in the lower pole.  He came in to see me today because he has developed erectile dysfunction. He said this has occurred over the course of a little over a year. He said his has progressed to the point where he is unable to achieve erection at all. He has never tried a phosphodiesterase inhibitor.   -07/17/20-patient with prior history of nephrolithiasis requiring left-sided PCNL in August of 2019 as above. He presents today with 1 month history of some right-sided flank and back discomfort which is intermittent in nature and sometimes associated with nausea. Has denied any vomiting or fever. Has also had some intermittent gross hematuria approximately 1 week ago. He was seen by his PCP and CT scan was ordered on 07/05/2020. I reviewed this in detail and shows bilateral nonobstructing renal calculi and what appears to be may be a punctate stone or stone debris in that region of the right  distal ureter approximately 2 cm above the ureterovesical junction. There is no hydronephrosis on either side and no stones noted in the left ureter. Currently he is minimally symptomatic. Here for evaluation management.  Micro urinalysis today shows 3-10 RBCs 0-5 WBCs.  KUB is reviewed today shows no obvious bony abnormalities. Patient does have some visible calcifications overlying the left renal contour measuring 3-4 minutes mm in size probably 4-5 noted calcifications. I do not see any obvious calcification overlying the right renal contour or any calcifications along the expected course of the ureters.  -08/03/20-patient with history of recent right-sided flank pain as above. KUB showed no obvious ureteral calculi . Patient was treated with expectant medical expulsive therapy with tamsulosin is now here for follow-up. In the interim, the patient continues to have some intermittent flank pain and nausea but no vomiting or fever. Last episode of hematuria was yesterday.  As patient continues to have intermittent pain and hematuria will plan for repeat CT urogram and see back after CT scan to review.   -08/18/20-patient with history of nephrolithiasis and intermittent right-sided flank pain. Has had CT urogram in the interim which shows no evidence of obstructing stones, there are bilateral renal calculi with several within the lower calyx area and renal pelvis on the left measuring probably 4 mm maximum diameter. There is also a renal cyst on the left. Smaller nonobstructing stones on the right. No ureteral calculi  noted. There are some diverticular changes noted which a relatively stable to prior CT scan in 2019 no evidence of obvious diverticulitis. Patient continues to see intermittent gross hematuria but none in several days. He has had no further pain.   CLINICAL DATA: Ongoing gross hematuria for 3 months. History of  renal calculi and Crohn's disease.   EXAM:  CT ABDOMEN AND PELVIS WITHOUT  CONTRAST   TECHNIQUE:  Multidetector CT imaging of the abdomen and pelvis was performed  following the standard protocol without IV contrast.   COMPARISON: CT 07/05/2020   FINDINGS:  Lower chest: Clear lung bases. No significant pleural or pericardial  effusion.   Hepatobiliary: Diffuse contour irregularity of the liver again noted  suspicious for cirrhosis. No focal hepatic abnormalities are  identified on noncontrast imaging. No evidence of gallstones,  gallbladder wall thickening or biliary dilatation.   Pancreas: Unremarkable. No pancreatic ductal dilatation or  surrounding inflammatory changes.   Spleen: Mild to moderate splenomegaly again noted with the spleen  measuring up to 14.5 cm in height. No focal splenic lesion  identified on noncontrast imaging.   Adrenals/Urinary Tract: Both adrenal glands appear normal. Numerous  nonobstructing renal calculi are again noted bilaterally, similar in  size and distribution to the previous study. There are possible  infundibular calculi within the right upper pole and left lower  pole. No evidence of ureteral or bladder calculus. No hydronephrosis  or perinephric soft tissue stranding. Cysts in the upper pole of the  left kidney are grossly stable. The bladder appears unremarkable.   Stomach/Bowel: The stomach appears unremarkable for its degree of  distension. No evidence of bowel wall thickening, distention or  surrounding inflammatory change. The appendix is not clearly  visualized, although there is no pericecal inflammatory change.  There are diverticular changes throughout the sigmoid colon, similar  to previous study.   Vascular/Lymphatic: There are no enlarged abdominal or pelvic lymph  nodes. Mild aortic and branch vessel atherosclerosis.   Reproductive: The prostate gland and seminal vesicles appear  unremarkable.   Other: Postsurgical changes in the anterior abdominal wall  consistent with hernia repair. Stable  asymmetric fat left inguinal  canal. No ascites.   Musculoskeletal: No acute or significant osseous findings. Mild  lumbar spondylosis.   IMPRESSION:  1. No acute findings or explanation for the patient's symptoms.  2. Bilateral nephrolithiasis. No evidence of ureteral calculus or  hydronephrosis.  3. Stable changes of cirrhosis and portal hypertension.  4. Sigmoid diverticulosis.  5. Aortic Atherosclerosis (ICD10-I70.0).    Electronically Signed  By: Richardean Sale M.D.  On: 08/11/2020 15:28  Of  -12/19/20-patient with history nephrolithiasis with known renal calculi based on CT scan in February of 2022. Presents today with issues regarding what sounds like urge incontinence. He has been recently hospitalized for osteomyelitis of the jaw requiring teeth extraction. Currently on IV Invanz as outpatient with a PICC line. During the hospital stay of about 10 days he states he has had gradual worsening urgency and inability to hold his urine. He states that the force of the stream has been fairly good but he also has had some mild dysuria. Denies any hematuria. No back pain flank pain. Leading up to his hospital stay states he had with worsening urgency where he could not make it to the bathroom in time. Now wearing a depends type pad for protection.  Micro urinalysis today showed 10-20 WBCs 1 +protein  Postvoid residual equals: 38 cc  As residual was minimal is  having mainly urge type symptoms I have initiated Myrbetriq 25 mg daily as samples today. See back in a month for follow-up and PVR.  And urine culture was sent but no antibiotics unless culture positive. He is on current IV Invanz so expect culture will be negative   -01/18/21-patient with history of nephrolithiasis and recent urinary urgency with minimal residual noted. Was initiated on Myrbetriq 25 mg daily, he has noted marked improvement of the urinary urge incontinence with almost resolution of symptoms while on the medicine.  Tolerated medications satisfactorily.  Patient had urine culture on 12/19/2020 which was negative. Micro urinalysis today shows 3-10 RBCs otherwise clear. Postvoid residual is minimal today.  -04/20/21-patient with history of nephrolithiasis and urinary urgency. Has been managed with tolterodine 4 mg daily. 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  Micro urinalysis greater than 60 RBCs rare bacteria  Post void residual equals: 11 mL  -08/20/21-patient with history of nephrolithiasis and urinary urgency. Has been on tolterodine 4 mg daily. Had recent flareup of worsening urgency and weak gave him some samples of Myrbetriq to add to the tolterodine. In the interim the patient continues to have some intermittent urgency and it seems to be treatments for his squamous cell carcinoma. Also urgency seems to be worse when he has anesthesia for resection of the squamous cell of his face. Myrbetriq does seem to help.  Micro urinalysis shows 3-10 RBCs rare bacteria  Postvoid residual equals: 137 cc  -11/14/21-patient with history nephrolithiasis and urinary urgency. Urgency has been managed with tolterodine 4 mg daily and recently increased Myrbetriq dose up to 50 mg daily. In the interim the patient has had evaluation for liver masses questionable suggesting static carcinoma versus hepatocellular carcinoma. During evaluation for the liver lesion had an MRI scan which suggested some left-sided hydronephrosis but the distal ureters were not well visualized by report. I cannot view the actual images today the only the report. See report MRI report below. Patient states he was unable to get liver biopsy due to inaccessibility for needle biopsy. He has had some occasional gross hematuria in the interim. From urgency standpoint he remains on tolterodine 4 mg daily and  seems to be fairly well controlled. He is off the Myrbetriq as he ran out of his samples.  Micro urinalysis shows greater than 60 RBCs. Postvoid residuals 86 cc today.   EXAM: MRI ABDOMEN W WO CONTRAST  DATE: 10/31/2021 1:42 PM  ACCESSION: [id number]  DICTATED: 10/31/2021 1:55 PM  INTERPRETATION LOCATION: [place]   CLINICAL INDICATION: 61 years old Male with assess liver lesion seen on CT chest; history of liver cirrhosis ; Liver metastases suspected - K76.9 - Liver lesion   COMPARISON: 09/11/2021 chest CT and brain MRI   TECHNIQUE: MRI of the abdomen was obtained with and without IV contrast. Multisequence, multiplanar images were obtained.   CONTRAST: 8.46m of Multihance   FINDINGS:   Evaluation is slightly limited secondary to patient motion.   LINES/DEVICES: None.   LOWER CHEST: Unremarkable.   ABDOMEN/PELVIS:   HEPATOBILIARY: The liver is cirrhotic with a nodular contour and left hepatic hypertrophy.   The following focal hepatic lesions are identified:   LR-M: A 4.7 x 3.3 cm rim-enhancing lesion in hepatic segment 7 at the hepatic dome which correlates with finding from prior CT. This lesion appears to be  centrally necrotic and does not demonstrate washout characteristics. An additional rim-enhancing lesion is noted in hepatic segment 8 measuring 1.5 cm (13:26).   No lesions specific for [place] identified.   Gallbladder is physiologically distended and otherwise unremarkable. No biliary ductal dilatation.   PANCREAS: Unremarkable.  SPLEEN: Splenomegaly measuring up to 16.7 cm in craniocaudal dimension. Subcentimeter cystic lesions in the superior margin of the spleen (3:21).  ADRENAL GLANDS: Unremarkable.  KIDNEYS/URETERS: There is left-sided hydroureteronephrosis with the distal left ureter out of the field-of-view. T1 hyperintense cystic lesion along the upper pole the right kidney, without enhancement compatible with hemorrhagic/proteinaceous cyst measuring 1.0  cm (9:54). Left upper pole and interpolar simple cyst. No solid renal mass.  BOWEL/PERITONEUM/RETROPERITONEUM: No bowel obstruction. No acute inflammatory process. No ascites.  VASCULATURE: Abdominal aorta within normal limits for patient's age. Portal and hepatic veins are patent. Unremarkable inferior vena cava.  LYMPH NODES: No adenopathy.   BONES/SOFT TISSUES: Unremarkable.  Procedure Note   Mathis Bud Myles Rosenthal, MD - 10/31/2021  Formatting of this note might be different from the original.  EXAM: MRI ABDOMEN W WO CONTRAST  DATE: 10/31/2021 1:42 PM  ACCESSION: [id number]  DICTATED: 10/31/2021 1:55 PM  INTERPRETATION LOCATION: [place]   CLINICAL INDICATION: 61 years old Male with assess liver lesion seen on CT chest; history of liver cirrhosis ; Liver metastases suspected - K76.9 - Liver lesion   COMPARISON: 09/11/2021 chest CT and brain MRI   TECHNIQUE: MRI of the abdomen was obtained with and without IV contrast. Multisequence, multiplanar images were obtained.   CONTRAST: 8.68m of Multihance   FINDINGS:   Evaluation is slightly limited secondary to patient motion.   LINES/DEVICES: None.   LOWER CHEST: Unremarkable.   ABDOMEN/PELVIS:   HEPATOBILIARY: The liver is cirrhotic with a nodular contour and left hepatic hypertrophy.   The following focal hepatic lesions are identified:   LR-M: A 4.7 x 3.3 cm rim-enhancing lesion in hepatic segment 7 at the hepatic dome which correlates with finding from prior CT. This lesion appears to be centrally necrotic and does not demonstrate washout characteristics. An additional rim-enhancing lesion is noted in hepatic segment 8 measuring 1.5 cm (13:26).   [place]   Gallbladder is physiologically distended and otherwise unremarkable. No biliary ductal dilatation.   PANCREAS: Unremarkable.  SPLEEN: Splenomegaly measuring up to 16.7 cm in craniocaudal dimension. Subcentimeter cystic  lesions in the superior margin of the spleen (3:21).  ADRENAL GLANDS: Unremarkable.  KIDNEYS/URETERS: There is left-sided hydroureteronephrosis with the distal left ureter out of the field-of-view. T1 hyperintense cystic lesion along the upper pole the right kidney, without enhancement compatible with hemorrhagic/proteinaceous cyst measuring 1.0 cm (9:54). Left upper pole and interpolar simple cyst. No solid renal mass.  BOWEL/PERITONEUM/RETROPERITONEUM: No bowel obstruction. No acute inflammatory process. No ascites.  VASCULATURE: Abdominal aorta within normal limits for patient's age. Portal and hepatic veins are patent. Unremarkable inferior vena cava.  LYMPH NODES: No adenopathy.   BONES/SOFT TISSUES: Unremarkable.   IMPRESSION:  -- LR-M: 4.7 cm rim-enhancing lesion in hepatic segment and 1.5 cm hepatic segment 8 rim-enhancing lesion. This may represent an atypical HCC or mixed HCC/cholangiocarcinoma remains in the differential. Hepatic metastases also remains in the differential given history of metastatic squamous cell carcinoma. Tissue sampling is recommended.  --Right hydroureteronephrosis with the distal right ureter are not included in the field-of-view. Obstructing lesion is favored and further evaluation with CT, renal colic protocol would be able to assess for obstructing  distal ureteral calculus. The patient states he has had some intermittent gross hematuria over the last month  -12/07/21-patient with history of recent hydronephrosis found on MRI scan. Ureters were not well visualized has had follow-up CT scan in the interim which shows 2 to 3 mm ureteral calculi in the mid right ureter causing significant hydronephrosis. They appear to be stacked close to the iliac crossing.  Micro urinalysis shows 10-20 RBCs.    CLINICAL DATA: Microhematuria   EXAM:  CT ABDOMEN AND PELVIS WITHOUT AND WITH CONTRAST   TECHNIQUE:  Multidetector CT imaging of the abdomen and pelvis was performed   following the standard protocol before and following the bolus  administration of intravenous contrast.   RADIATION DOSE REDUCTION: This exam was performed according to the  departmental dose-optimization program which includes automated  exposure control, adjustment of the mA and/or kV according to  patient size and/or use of iterative reconstruction technique.   CONTRAST: 125 mL Omnipaque 300   COMPARISON: CT abdomen and pelvis 08/11/2020   FINDINGS:  Lower chest: No acute abnormality.   Hepatobiliary: Liver is mildly nodular with relative enlargement of  the left hepatic lobe, consistent with cirrhosis. There is an  irregular ill-defined mildly hypodense lesion at the dome of the  right hepatic lobe measuring approximately 3 cm, not visualized on  previous study. Gallbladder appears within normal limits. No biliary  ductal dilatation.   Pancreas: Unremarkable. No pancreatic ductal dilatation or  surrounding inflammatory changes.   Spleen: Markedly enlarged measuring up to 17.3 cm in length.   Adrenals/Urinary Tract: Adrenal glands appear normal. Kidneys are  lobulated with renal cortical thinning, right worse than left. There  are 2 stacked obstructing calculi in the mid right ureter measuring  approximately 3 mm each, causing moderate to severe right  hydronephrosis. A few tiny calculi visualized in the lower pole  right kidney. Numerous calculi visualized in the left kidney  measuring up to 9 mm in the lower pole. No enhancing renal mass  identified bilaterally. A few renal cortical cysts visualized  measuring up to 3.6 cm in the upper left kidney. Urinary bladder  appears within normal limits.   Stomach/Bowel: No bowel obstruction, free air or pneumatosis. No  bowel wall edema identified. Moderate to large amount of retained  fecal material throughout the colon. A few colonic diverticula  noted. No evidence of acute appendicitis.   Vascular/Lymphatic: Aortic  atherosclerosis. No enlarged abdominal or  pelvic lymph nodes.   Reproductive: Prostate is unremarkable.   Other: No ascites.   Musculoskeletal: No suspicious bony lesions identified.   IMPRESSION:  1. Two adjacent stacked obstructing calculi in the mid right kidney  measuring approximately 3 mm each and causing moderate to severe  right hydronephrosis.  2. Bilateral nephrolithiasis, significantly greater on the left.  3. Evidence of hepatic cirrhosis with a 3 cm ill-defined  indeterminate mass at the dome of the right hepatic lobe. Further  evaluation with MRI abdomen with contrast is recommended.  4. Marked splenomegaly, suggesting portal hypertension.  5. Other chronic findings as described.    Electronically Signed  By: Ofilia Neas M.D.  On: 12/01/2021 19:39  Reviewed CT findings with patient. Recommended trial of tamsulosin 0.4 mg daily as medical expulsive therapy. He is minimally symptomatic currently and I think a trial of passage is warranted. Plan to see back in 2 weeks for follow-up KUB and renal ultrasound on return. Patient will call if he has worsening pain nausea vomiting or fever in  the interim.   -01/21/22-patient with right renal calculi causing hydronephrosis but was asymptomatic. Patient has been on tamsulosin 0.4 mg daily as medical expulsive therapy. In the interim the patient unfortunately fell and broke his hip requiring surgery on 26 June. Also he has had liver biopsy which confirmed hepatocellular carcinoma and is undergoing further management at [facility]. He has had no flank pain or gross hematuria.  Micro urinalysis is clear on urine spun sediment  Renal ultrasound is reviewed today and shows: No significant hydronephrosis on the right side. Does have some nonobstructing left renal calculi maximum 5.8 mm. Also questionable small 3 and 2 mm stones in the right kidney.  KUB is reviewed today and shows: No obvious bony abnormality. There are some calcifications  overlying the left renal contour but no obvious stones in the region of the right distal ureter as seen on previous CT scan.   04/15/22: [name] [name] presented to the ED on 03/28/22 with abdominal pain, fevers and confusion. He found to have a 7x32m obstructing left UPJ stone, along with multiple other calculi in the renal pelvis. A stent was placed and the patient was admitted to the hospital for IV antibiotics and monitoring. [facility] He presents today to discuss definitive stone intervention. tolerating the stent well. Continues tohave gross heamturia and some mild urinary frequency.     ALLERGIES: Dilaudid TABS Sulfa Drugs - Hives, Skin Rash Zithromax PACK - Hives, Skin Rash    MEDICATIONS: Myrbetriq 50 mg tablet, extended release 24 hr  Tolterodine Tartrate Er 4 mg capsule, ext release 24 hr 1 capsule PO Daily  Acitretin 25 mg capsule  B Complex  Calcium 600 + D TABS Oral  Cefdinir 300 mg capsule  Chlorhexidine Gluconate 0.12 % mouthwash 1 PO Daily  Dairy Relief  Eszopiclone 3 mg tablet  Eye Drops  Folic Acid 1 mg tablet Oral  Lamotrigine 100 mg tablet  Lantanoprost  Lialda 1.2 gram tablet, delayed release Oral  Libtayo 350 mg/7 ml (50 mg/ml) vial  Mesalamine  Methscopolamine Bromide 5 mg tablet Oral  Mirtazapine 15 mg tablet  Nexium 40 mg capsule,delayed release Oral  Oxycodone Hcl 5 mg tablet  Potassium  Potassium Chloride 20 meq tablet, ext release, particles/crystals Oral  Probiotic  Sertraline Hcl 100 mg tablet  Trazadone 100Mg  Vitamin D3  Xifaxan     GU PSH: Cysto Remove Stent FB Sim - 2019 ESWL - 2011 Locm 300-399Mg/Ml Iodine,1Ml - 11/30/2021, 2019 Percut Stone Removal >2cm, Left - 2019       PSH Notes: Lithotripsy, Shoulder Surgery, Nose Surgery  Squamous cell carcinoma removal from face x 2   NON-GU PSH: No Non-GU PSH    GU PMH: Hydronephrosis - 01/21/2022, (Stable), - 12/07/2021, - 11/14/2021 Renal and ureteral  calculus - 01/21/2022, - 2022, - 2022, - 2022 Flank Pain - 12/07/2021, - 2022 Ureteral calculus (Stable) - 12/07/2021, (Acute), Left, He now has a stone in his proximal right ureter just below the UPJ measuring 8 mm in width with Hounsfield units of 1200. There is associated very mild left hydronephrosis., - 2019 Microscopic hematuria - 11/30/2021, (Stable), - 11/14/2021, - 04/20/2021 (Stable), He had microscopic hematuria again noted today. I will send urine for cytology., - 2017 Renal calculus - 08/20/2021, - 04/20/2021, - 2022 (Stable), - 2022, He has left renal calculi that appear fairly stable over the course of several years now., - 2021 (Stable), Bilateral, He has a stone in the upper  pole and in the lower pole of his left kidney. These remain stable and are nonobstructing. He does have hydronephrosis secondary to a stone but it is relatively mild. There was also a 5 mm stone in the upper pole of the right kidney., - 2019 (Stable), Left, The 3 stones in his left kidney appear to be unchanged when compared to his CT scan done 1 year ago. No new stones have developed. These will be reimaged when I reimage his kidneys again in 6 months., - 2018 Urge incontinence - 08/20/2021, - 04/20/2021, - 01/18/2021, - 12/19/2020 Gross hematuria - 2022, - 2022 (Stable), - 2022 ED due to arterial insufficiency, We are going to proceed with sildenafil 100 mg. I told him to make sure he takes it on an empty stomach and to potentially start off with half a pill. If this is not effective he will contact me so we can discuss further management options. - 2021 Right renal neoplasm (Improving), Right, There was no evidence of right renal neoplasm noted on his CT scan. This appears to have been a flow artifact or some form of inflammation due to his previous sepsis. - 2019 Encounter for Prostate Cancer screening (Stable), His prostate is noted to be smooth and benign. His PSA remains quite low at 0.59. I will continue to follow him  with annual DRE and PSA. - 2018 Renal cyst (Stable), Left, Renal cysts are again notified on his CT scan. They appear simple and of no clinical significance. - 2017      PMH Notes: Left renal cysts: He appeared to have 2 left renal cyst by CT scan in 3/12 however a CT scan in 5/14 revealed what appeared to be only one cyst.   He has microscopic hematuria that has been persistent over time. This was fully worked up in 1/11 with the finding of a normal creatinine, normal cystoscopy and a CT scan that revealed 3 left renal calculi and 4 right renal cysts. His NMP 22 was positive however a followup urine cytology was negative.   Gross hematuria: He began experiencing painless, gross hematuria approximately In 11/16. He also had noted increased urinary frequency, urgency and nocturia.  CT scan and cystoscopy 11/16 - negative other than the finding of a renal calculus.   Question of right renal mass: He underwent a CT scan on 05/05/17 which revealed his known left renal cyst and unchanged left renal calculi however there was a subtle 2.3 cm lesion in the medial aspect of the right kidney.  MRI scan with and without contrast on 05/24/17 which did reveal the lesion however due to the patient's inability to hold his breath there was motion degradation.  CT scan with and without contrast 06/10/17: No evidence of right renal mass whatsoever.     NON-GU PMH: Pyuria/other UA findings (Stable) - 12/19/2020, He has been having some dysuria and did have some bacteria as well as white blood cells in the urine. In preparation for his surgery his urine will be cultured., - 2019 Encounter for general adult medical examination without abnormal findings, Encounter for preventive health examination - 2016 [name], Crohn's Disease - 2014 Personal history of other diseases of the circulatory system, History of hypertension - 2014 Liver cell carcinoma    FAMILY HISTORY: Alzheimer's Disease - Mother Death In The  Family Father - Father Death In The Family Mother - Mother Family Health Status Number - Runs In Family Transient Ischemic Attack - Father   SOCIAL HISTORY: Marital  Status: Married Preferred Language: English; Ethnicity: Not Hispanic Or Latino; Race: White Current Smoking Status: Patient has never smoked.   Tobacco Use Assessment Completed: Used Tobacco in last 30 days? Does not use smokeless tobacco. Does not use drugs. Drinks 2 caffeinated drinks per day. Has not had a blood transfusion.     Notes: Never A Smoker, Tobacco Use, Caffeine Use, Marital History - Currently Married, Occupation:, Alcohol Use   REVIEW OF SYSTEMS:    GU Review Male:   Patient reports frequent urination and burning/ pain with urination. Patient denies hard to postpone urination, get up at night to urinate, leakage of urine, stream starts and stops, trouble starting your stream, have to strain to urinate , erection problems, and penile pain.  Gastrointestinal (Upper):   Patient denies nausea, vomiting, and indigestion/ heartburn.  Gastrointestinal (Lower):   Patient denies diarrhea and constipation.  Constitutional:   Patient denies fever, night sweats, weight loss, and fatigue.  Skin:   Patient denies itching and skin rash/ lesion.  Eyes:   Patient denies blurred vision and double vision.  Hematologic/Lymphatic:   Patient denies swollen glands and easy bruising.  Respiratory:   Patient denies cough and shortness of breath.  Musculoskeletal:   Patient denies back pain and joint pain.  Neurological:   Patient denies headaches and dizziness.  Psychologic:   Patient denies depression and anxiety.   VITAL SIGNS:      04/15/2022 11:03 AM  BP 136/85 mmHg  Pulse 94 /min  Temperature 97.8 F / 36.5 C   MULTI-SYSTEM PHYSICAL EXAMINATION:    Constitutional: Well-nourished. No physical deformities. Normally developed. Good grooming.  Respiratory: No labored breathing, no use of accessory muscles.   Cardiovascular:  Normal temperature, normal extremity pulses, no swelling, no varicosities.  Skin: No paleness, no jaundice, no cyanosis. No lesion, no ulcer, no rash.  Neurologic / Psychiatric: Oriented to time, oriented to place, oriented to person. No depression, no anxiety, no agitation.  Gastrointestinal: No mass, no tenderness, no rigidity, non obese abdomen.     Complexity of Data:  Source Of History:  Patient  Records Review:   Previous Hospital Records, Previous Patient Records  Urine Test Review:   Urinalysis  X-Ray Review: C.T. Abdomen/Pelvis: Reviewed Films.     06/05/17 05/28/16 05/04/15 06/27/09  PSA  Total PSA 0.59 ng/mL 0.24  0.35  0.34     04/15/22  Urinalysis  Urine Appearance Cloudy   Urine Color Brown   Urine Glucose Trace mg/dL  Urine Bilirubin Neg mg/dL  Urine Ketones Trace mg/dL  Urine Specific Gravity 1.020   Urine Blood 3+ ery/uL  Urine pH 6.0   Urine Protein 3+ mg/dL  Urine Urobilinogen 0.2 mg/dL  Urine Nitrites Positive   Urine Leukocyte Esterase 2+ leu/uL  Urine WBC/hpf 10 - 20/hpf   Urine RBC/hpf >60/hpf   Urine Epithelial Cells NS (Not Seen)   Urine Bacteria Mod (26-50/hpf)   Urine Mucous Not Present   Urine Yeast NS (Not Seen)   Urine Trichomonas Not Present   Urine Cystals NS (Not Seen)   Urine Casts NS (Not Seen)   Urine Sperm Not Present    PROCEDURES:          Urinalysis w/Scope - 81001 Dipstick Dipstick Cont'd Micro  Color: Brown Bilirubin: Neg WBC/hpf: 10 - 20/hpf  Appearance: Cloudy Ketones: Trace RBC/hpf: >60/hpf  Specific Gravity: 1.020 Blood: 3+ Bacteria: Mod (26-50/hpf)  pH: 6.0 Protein: 3+ Cystals: NS (Not Seen)  Glucose: Trace Urobilinogen: 0.2 Casts: NS (  Not Seen)    Nitrites: Positive Trichomonas: Not Present    Leukocyte Esterase: 2+ Mucous: Not Present      Epithelial Cells: NS (Not Seen)      Yeast: NS (Not Seen)      Sperm: Not Present    Notes:  QNS for spun micro     ASSESSMENT:      ICD-10 Details  1 GU:    Hydronephrosis - N13.0 Chronic, Stable  2   Renal and ureteral calculus - N20.2 Chronic, Stable   PLAN:           Orders Labs CULTURE, URINE          Document Letter(s):  Created for Patient: Clinical Summary         Notes:   Urinalysis will be sent for precautionary culture today. Stone intervention was discussed in detail today. For ureteroscopy, the patient understands that there is a chance for a staged procedure. Patient also understands that there is risk for bleeding, infection, injury to surrounding organs, and general risks of anesthesia. The patient also understands the placement of a stent and the risks of stent placement including, risk for infection, the risk for pain, and the risk for injury.   Green sheet placed.

## 2022-05-20 NOTE — ED Provider Notes (Signed)
[facility] EMERGENCY DEPARTMENT Provider Note  CSN: [id number] Arrival date & time: 05/20/22 1508  Chief Complaint(s) No chief complaint on file.  HPI [name] [name] is a 61 y.o. male with PMH Crohn's disease, complex partial seizures, nephrolithiasis, osteoporosis currently living in a skilled nursing rehab facility who presents emergency department for evaluation of a fall.  Patient states that he was walking and feels like he rounded a corner too quickly and fell onto his left hip.  He currently endorses pain worse at the sacrum and at the a left hip.  Denies numbness, tingling, weakness of the lower extremity.  Denies chest pain, abdominal pain, nausea, vomiting, headache, fever or other systemic symptoms.   Past Medical History Past Medical History:  Diagnosis Date   Adenomatous colon polyp 10/1999   Anemia    Anxiety    Arthritis    neck, yoga helps.   Cataract    Complex partial seizure ([place])    last seizure was 08-30-2018   Crohn's disease of small and large intestines ([place]) 1999   Depression    Diabetes mellitus    no meds at this time 10-24-17   Esophageal stricture    External hemorrhoids    GERD (gastroesophageal reflux disease)    Glaucoma    History of kidney stones    3 large stones still present   Hyperlipemia    Hypertension    resolved with weight loss    Inguinal hernia, bilateral 12/2019   Liver cirrhosis secondary to NASH [facility])    Migraines    Osteoporosis    Pathological fracture of left hip due to osteoporosis ([place]) 12/18/2021   Pneumonia    PONV (postoperative nausea and vomiting)    PTSD (post-traumatic stress disorder)    Renal cyst, acquired, left 07/08/2017   Skin cancer    squamous cell multiple; [facility]; followed every 3 months.   Sleep apnea    CPAP machine, uses nightly   Staphylococcus aureus bacteremia with sepsis ([place]) 05/28/2017   MRSA 2012   Patient Active Problem List   Diagnosis Date Noted   Bilateral  sensorineural hearing loss 04/08/2022   Crohn's disease ([place]) 18/56/3149   Acute metabolic encephalopathy 70/26/3785   Thrombocytopenia ([place]) 03/29/2022   Ureteral obstruction, left 03/28/2022   Hepatocellular carcinoma ([place]) 03/25/2022   Cirrhosis of liver without ascites ([place])    Urinary tract infection without hematuria    Sepsis secondary to UTI ([place]) 03/17/2022   Hyperbilirubinemia 03/17/2022   Hydronephrosis of right kidney 03/17/2022   Nephrolith 03/17/2022   High risk medication use 02/19/2022   Protein-calorie malnutrition, severe 12/19/2021   Pathological fracture of left hip due to osteoporosis ([place]) 12/18/2021   Closed nondisplaced intertrochanteric fracture of left femur, initial encounter ([place]) 12/17/2021   Rib fracture 12/17/2021   Liver lesion 10/08/2021   Alteration in speech 10/08/2021   Squamous cell carcinoma in situ (SCCIS) of skin of right cheek 07/18/2021   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 05/11/2021   SCC (squamous cell carcinoma) 03/21/2021   Partial optic atrophy of both eyes 03/16/2021   Pharyngeal dysphagia 02/22/2021   Liver cirrhosis secondary to NASH with liver mass     GERD (gastroesophageal reflux disease)    Pancytopenia (HCC)    Osteomyelitis of jaw 12/29/2020   Acute osteomyelitis of mandible 12/26/2020   Sepsis with acute end organ damage 12/04/2020   S/P inguinal hernia repair 01/14/2020   Psychogenic nonepileptic seizure 09/01/2019   At  high risk for falls    S/P shoulder surgery 03/05/2018   Excessive daytime sleepiness 02/18/2018   Crohn's disease of colon with rectal bleeding ([place]) 02/18/2018   Iron deficiency anemia due to chronic blood loss and thrombocytopenia  02/18/2018   Iron deficiency anemia due to sideropenic dysphagia 02/18/2018   Hematochezia 02/18/2018   Renal calculus, left 02/02/2018   History of sepsis 12/16/2017   Renal cyst, acquired, left 07/08/2017   Frequent falls 07/08/2017   Renal lesion  05/28/2017   MSSA bacteremia 05/06/2017   Depression 04/30/2016   Memory loss 04/30/2016   OSA on CPAP 10/31/2015   Nonepileptic episode ([place]) 04/25/2015   Gait abnormality 12/14/2014   DOE (dyspnea on exertion) 03/12/2013   Complex partial seizures 04/19/2012   DM2 (diabetes mellitus, type 2) ([place]) 09/25/2011   OTHER DYSPHAGIA 03/27/2009   GERD 12/07/2007   EXTERNAL HEMORRHOIDS 09/11/2007   CROHN'S DISEASE, LARGE AND SMALL INTESTINES 09/11/2007   OSTEOPOROSIS 09/11/2007   Home Medication(s) Prior to Admission medications   Medication Sig Start Date End Date Taking? Authorizing Provider  acetaminophen (TYLENOL) 500 MG tablet Take 2 tablets (1,000 mg total) by mouth 3 (three) times daily. 12/21/21  Yes Eulogio Bear U, DO  Acidophilus Lactobacillus CAPS Take 10 Billion Cells by mouth at bedtime.   Yes [provider]  acitretin ([name]) 25 MG capsule Take 25 mg by mouth in the morning. 11/18/21  Yes [provider]  Calcium Oyster Shell 500 MG TABS Take 500 mg by mouth 2 (two) times daily.   Yes [provider]  Cholecalciferol (VITAMIN D-3) 125 MCG (5000 UT) TABS Take 5,000 Units by mouth in the morning.   Yes [provider]  cycloSPORINE (RESTASIS MULTIDOSE) 0.05 % ophthalmic emulsion Place 1 drop into both eyes 2 (two) times daily as needed (dry eyes).   Yes [provider]  esomeprazole (NEXIUM) 40 MG capsule Take 40 mg by mouth 2 (two) times daily.   Yes [provider]  eszopiclone (LUNESTA) 2 MG TABS tablet Take 2 mg by mouth at bedtime. Take immediately before bedtime   Yes [provider]  folic acid (FOLVITE) 338 MCG tablet Take 1,600 mcg by mouth every evening.   Yes [provider]  gabapentin (NEURONTIN) 300 MG capsule Take 300-600 mg by mouth See admin instructions. 2 entries on MAR :  300 mg in the morning, 600 mg at bedtime.   Yes [provider]  hydrocortisone 2.5 % lotion Apply 1  application  topically 2 (two) times daily as needed (itchy skin).   Yes [provider]  lamoTRIgine (LAMICTAL) 150 MG tablet Take 1 tablet (150 mg total) by mouth 2 (two) times daily. 10/15/21  Yes Lomax, Amy, NP  latanoprost (XALATAN) 0.005 % ophthalmic solution Place 1 drop into both eyes at bedtime.   Yes [provider]  loperamide (IMODIUM A-D) 2 MG tablet Take 2 mg by mouth See admin instructions. 2 entries on MAR: 2 mg once a day on Wednesday, Saturday 2 mg once a day as needed for diarrhea   Yes [provider]  Menthol, Topical Analgesic, (EUCERIN ITCH RELIEF) 0.1 % LOTN Apply 1 Application topically in the morning.   Yes [provider]  mesalamine (LIALDA) 1.2 g EC tablet TAKE 2 TABLETS (2.4G TOTAL)TWO TIMES A DAY Patient taking differently: Take 2.4 g by mouth 2 (two) times daily. 05/20/22  Yes Ladene Artist, MD  methscopolamine (PAMINE FORTE) 5 MG tablet Take 1 tablet (  5 mg total) by mouth 2 (two) times daily. 12/17/21 05/20/22 Yes Ladene Artist, MD  mirabegron ER (MYRBETRIQ) 50 MG TB24 tablet Take 50 mg by mouth at bedtime.   Yes [provider]  Nutritional Supplements (NUTRITIONAL DRINK) LIQD Take 120 mLs by mouth 2 (two) times daily between meals. House beverage (supplement)   Yes [provider]  ondansetron (ZOFRAN-ODT) 4 MG disintegrating tablet Take 4 mg by mouth in the morning.   Yes [provider]  oxyCODONE (OXY IR/ROXICODONE) 5 MG immediate release tablet Take 1 tablet (5 mg total) by mouth daily as needed for moderate pain or severe pain. Patient taking differently: Take 5 mg by mouth 2 (two) times daily as needed for breakthrough pain. 03/21/22  Yes Eugenie Filler, MD  potassium chloride SA (KLOR-CON M) 20 MEQ tablet Take 20 mEq by mouth in the morning.   Yes [provider]  Povidone-Iodine (BETADINE ANTISEPTIC EX) Apply 30 mLs topically See admin instructions. Soak right hallux toe with 2  tablespoons of betadine for 20 minutes, pat dry, apply ABT ointment with bandaid or gauze with tape.   Yes [provider]  rifaximin (XIFAXAN) 550 MG TABS tablet Take 1 tablet (550 mg total) by mouth 2 (two) times daily. 06/04/21  Yes Ladene Artist, MD  sertraline (ZOLOFT) 100 MG tablet Take 100 mg by mouth in the morning and at bedtime.   Yes [provider]  tolterodine (DETROL LA) 4 MG 24 hr capsule Take 4 mg by mouth at bedtime.   Yes [provider]  traZODone (DESYREL) 150 MG tablet Take 150 mg by mouth at bedtime.   Yes [provider]                                                                                                                                    Past Surgical History Past Surgical History:  Procedure Laterality Date   CATARACT EXTRACTION Bilateral    COLONOSCOPY     CYSTOSCOPY W/ URETERAL STENT PLACEMENT Right 03/18/2022   Procedure: CYSTOSCOPY WITH RETROGRADE PYELOGRAM, URETEROSCOPY;  Surgeon: Remi Haggard, MD;  Location: [facility];  Service: Urology;  Laterality: Right;   CYSTOSCOPY W/ URETERAL STENT PLACEMENT Left 03/28/2022   Procedure: CYSTOSCOPY WITH RETROGRADE URETERAL STENT PLACEMENT;  Surgeon: Festus Aloe, MD;  Location: [facility];  Service: Urology;  Laterality: Left;   DEBRIDEMENT MANDIBLE N/A 12/07/2020   Procedure: DEBRIDEMENT OF MANDIBLE;  Surgeon: [name], DMD;  Location: [facility];  Service: Oral Surgery;  Laterality: N/A;   ELBOW SURGERY  2012   elbow MRSA infection    EYE SURGERY     cataracts removed, /w IOL   GROSS TEETH DEBRIDEMENT N/A 12/29/2020   Procedure: MANDIBULAR  DEBRIDEMENT;  Surgeon: [name], DMD;  Location: [facility];  Service: Dentistry;  Laterality: N/A;   INCISION AND DRAINAGE ABSCESS N/A 12/07/2020   Procedure:  INCISION AND DRAINAGE MANDIBULAR ABSCESS;  Surgeon: [name], DMD;  Location: [facility];  Service: Oral Surgery;  Laterality: N/A;   INGUINAL HERNIA REPAIR  Right 01/14/2020   Procedure: OPEN RIGHT HERNIA REPAIR INGUINAL WITH MESH;  Surgeon: [name], MD;  [facility]: [facility];  Service: General;  Laterality: Right;   INSERTION OF MESH N/A 07/01/2016   Procedure: INSERTION OF MESH;  Surgeon: [name], MD;  Location: [facility];  Service: General;  Laterality: N/A;   INTRAMEDULLARY (IM) NAIL INTERTROCHANTERIC Left 12/17/2021   Procedure: INTRAMEDULLARY (IM) NAIL INTERTROCHANTRIC;  Surgeon: Altamese Joshua, MD;  Location: [facility];  Service: Orthopedics;  Laterality: Left;   IR URETERAL STENT LEFT NEW ACCESS W/O SEP NEPHROSTOMY CATH  02/02/2018   NEPHROLITHOTOMY Left 02/02/2018   Procedure: NEPHROLITHOTOMY PERCUTANEOUS;  Surgeon: [name], MD;  [facility];  Service: Urology;  Laterality: Left;   POLYPECTOMY     RADIOFREQUENCY ABLATION LIVER TUMOR     done in [place] Right 10/16/2017   From fall/staples   SHOULDER ARTHROSCOPY Right 03/05/2018   Procedure: ARTHROSCOPY SHOULDER AND OPEN DISTAL CLAVICLE EXCISION;  Surgeon: [name], MD;  Location: [facility];  Service: Orthopedics;  Laterality: Right;   SHOULDER SURGERY     SINUS SURGERY WITH INSTATRAK     SKIN CANCER EXCISION     x 2  done  in [place]   TEE WITHOUT CARDIOVERSION N/A 05/09/2017   Procedure: TRANSESOPHAGEAL ECHOCARDIOGRAM (TEE);  Surgeon: [name], MD;  Location: [facility];  Service: Cardiovascular;  Laterality: N/A;   TOOTH EXTRACTION N/A 12/07/2020   Procedure: DENTAL EXTRACTIONS OF TEETH TWENTY TO THIRTY;  Surgeon: [name], DMD;  Location: [facility] [place];  Service: Oral Surgery;  Laterality: N/A;   TOOTH EXTRACTION N/A 12/29/2020   Procedure: DENTAL RESTORATION/EXTRACTIONS;  Surgeon: [name], DMD;  Location: [facility];  Service: Dentistry;  Laterality: N/A;   TRANSTHORACIC ECHOCARDIOGRAM  02/22/2011   EF 55-65%; increased pattern of LVH with mild conc hypertrophy, abnormal relaxation & increased  filling pressure (grade 2 diastolic dysfunction); atrial septum thickened (lipomatous hypertrophy)   UMBILICAL HERNIA REPAIR N/A 07/01/2016   Procedure: UMBILICAL HERNIA REPAIR WITH MESH;  Surgeon: [name], MD;  Location: [facility];  Service: General;  Laterality: N/A;   UPPER GASTROINTESTINAL ENDOSCOPY     VASECTOMY     Family History Family History  Problem Relation Age of Onset   Heart disease Mother        CABG at age 78   Hyperlipidemia Mother    Hypertension Mother    Diabetes Father    Colon polyps Father    Stroke Father    Hyperlipidemia Father    Hypertension Father    Stroke Paternal Grandmother    Stroke Paternal Grandfather    Other Child        tetrology of fallot (cornealia deland syndrome)   Colon cancer Neg Hx    Esophageal cancer Neg Hx    Stomach cancer Neg Hx    Rectal cancer Neg Hx     Social History Social History   Tobacco Use   Smoking status: Never    Passive exposure: Never   Smokeless tobacco: Never  Vaping Use   Vaping Use: Never used  Substance Use Topics   Alcohol use: Never   Drug use: Never   Allergies Sulfonamide derivatives, Z-pak [azithromycin], Dilaudid [hydromorphone hcl], and Sulfa antibiotics  Review of Systems Review of Systems  Musculoskeletal:  Positive for arthralgias and myalgias.    Physical Exam Vital Signs  I have reviewed the triage vital signs BP (!) 148/85   Pulse 70   Temp 98.4 F (36.9 C) (Oral)   Resp 16   SpO2 100%   Physical Exam Constitutional:      General: He is not in acute distress.    Appearance: Normal appearance.  HENT:     Head: Normocephalic.     Comments: Multiple abrasions and hematomas to the head    Nose: No congestion or rhinorrhea.  Eyes:     General:        Right eye: No discharge.        Left eye: No discharge.     Extraocular Movements: Extraocular movements intact.     Pupils: Pupils are equal, round, and reactive to light.  Cardiovascular:     Rate and Rhythm:  Normal rate and regular rhythm.     Heart sounds: No murmur heard. Pulmonary:     Effort: No respiratory distress.     Breath sounds: No wheezing or rales.  Abdominal:     General: There is no distension.     Tenderness: There is no abdominal tenderness.  Musculoskeletal:        General: Swelling and tenderness present. Normal range of motion.     Cervical back: Normal range of motion.  Skin:    General: Skin is warm and dry.  Neurological:     General: No focal deficit present.     Mental Status: He is alert.     ED Results and Treatments Labs (all labs ordered are listed, but only abnormal results are displayed) Labs Reviewed  COMPREHENSIVE METABOLIC PANEL  CBC  URINALYSIS, [place]                                                                                                                          Radiology No results found.  Pertinent labs & imaging results that were available during my care of the patient were reviewed by me and considered in my medical decision making (see MDM for details).  Medications Ordered in ED Medications - No data to display  Procedures Procedures  (including critical care time)  Medical Decision Making / ED Course   This patient presents to the ED for concern of fall, hip pain, back pain, this involves an extensive number of treatment options, and is a complaint that carries with it a high risk of complications and morbidity.  The differential diagnosis includes fracture, dislocation, ligamentous injury, closed head injury, vertebral injury, intracranial hemorrhage  MDM: Patient seen emergency room for evaluation of a fall.  Physical exam with L-spine tenderness and tenderness in the left hip but is otherwise unremarkable.  Laboratory evaluation with a hemoglobin of 10.9 but  no leukocytosis, albumin 2.7, AST 51, alk phos 277, urinalysis with trace leuk esterase, greater than 50 red blood cells and 6-10 white blood cells consistent with a known kidney stone of which she is already following with urology for.  Trauma imaging is concerning for a acute L3 compression fracture which I spoke with the neurosurgical NP Reinaldo Meeker) about who is recommending lumbar corset and outpatient follow-up.  Trauma imaging also with a mild soft tissue defect in the neck which I do not appreciate significant traumatic injury on physical examination.  Hip x-rays negative.  Pain control provided and patient discharged with outpatient neurosurgical follow-up.   Additional history obtained: -Additional history obtained from wife -External records from outside source obtained and reviewed including: Chart review including previous notes, labs, imaging, consultation notes   Lab Tests: -I ordered, reviewed, and interpreted labs.   The pertinent results include:   Labs Reviewed  COMPREHENSIVE METABOLIC PANEL  CBC  URINALYSIS, ROUTINE W REFLEX MICROSCOPIC  PROTIME-INR     Imaging Studies ordered: I ordered imaging studies including CT head, C-spine, hip x-ray, L-spine CT I independently visualized and interpreted imaging. I agree with the radiologist interpretation   Medicines ordered and prescription drug management: No orders of the defined types were placed in this encounter.   -I have reviewed the patients home medicines and have made adjustments as needed  Critical interventions none  Consultations Obtained: I requested consultation with the neurosurgical practitioner,  and discussed lab and imaging findings as well as pertinent plan - they recommend: Lumbar corset   Cardiac Monitoring: The patient was maintained on a cardiac monitor.  I personally viewed and interpreted the cardiac monitored which showed an underlying rhythm of: NSR  Social Determinants of Health:   Factors impacting patients care include: none   Reevaluation: After the interventions noted above, I reevaluated the patient and found that they have :improved  Co morbidities that complicate the patient evaluation  Past Medical History:  Diagnosis Date   Adenomatous colon polyp 10/1999   Anemia    Anxiety    Arthritis    neck, yoga helps.   Cataract    Complex partial seizure ([place])    last seizure was 08-30-2018   Crohn's disease of small and large intestines ([place]) 1999   Depression    Diabetes mellitus    no meds at this time 10-24-17   Esophageal stricture    External hemorrhoids    GERD (gastroesophageal reflux disease)    Glaucoma    History of kidney stones    3 large stones still present   Hyperlipemia    Hypertension    resolved with weight loss    Inguinal hernia, bilateral 12/2019   Liver cirrhosis secondary to NASH [facility])    Migraines    Osteoporosis    Pathological fracture of left hip due to osteoporosis ([place]) 12/18/2021  Pneumonia    PONV (postoperative nausea and vomiting)    PTSD (post-traumatic stress disorder)    Renal cyst, acquired, left 07/08/2017   Skin cancer    squamous cell multiple; [facility]; followed every 3 months.   Sleep apnea    CPAP machine, uses nightly   Staphylococcus aureus bacteremia with sepsis ([place]) 05/28/2017   MRSA 2012      Dispostion: I considered admission for this patient, but he does not meet inpatient criteria for admission and will follow-up outpatient with neurosurgery.  Patient also encouraged to maintain follow-up with urology for his known nephrolithiasis need for cystoscopy.     Final Clinical Impression(s) / ED Diagnoses Final diagnoses:  None     _0 @    Teressa Lower, MD 05/21/22 [phone]

## 2022-05-20 NOTE — ED Triage Notes (Signed)
Pt BIB [facility] for a mechanical fall at [facility] about an hr prior to arrival. Pt landed on tailbone and has pain there.  He also hit his head on a piece of furniture. He has a small goose egg to the right posterior head and an abrasion on head with a bandaid over it.  Wife is at bedside and he and wife state that his platelets run low so he can bleed easily sometimes.   EMS VS 140/86, 100% HR 70 16 CBG 123.

## 2022-05-20 NOTE — Progress Notes (Signed)
Orthopedic Tech Progress Note Patient Details:  [name] [name] 07/27/1960 [id number]  Ortho Devices Type of Ortho Device: Lumbar corsett Ortho Device/Splint Interventions: Ordered, Application, Adjustment   Post Interventions Patient Tolerated: Well Instructions Provided: Care of device, Adjustment of device  Karolee Stamps 05/20/2022, 9:32 PM

## 2022-05-21 ENCOUNTER — Telehealth: Payer: Self-pay | Admitting: Medical Oncology

## 2022-05-21 DIAGNOSIS — C801 Malignant (primary) neoplasm, unspecified: Secondary | ICD-10-CM | POA: Diagnosis not present

## 2022-05-21 DIAGNOSIS — S32000A Wedge compression fracture of unspecified lumbar vertebra, initial encounter for closed fracture: Secondary | ICD-10-CM | POA: Diagnosis not present

## 2022-05-21 DIAGNOSIS — N201 Calculus of ureter: Secondary | ICD-10-CM | POA: Diagnosis not present

## 2022-05-21 DIAGNOSIS — D696 Thrombocytopenia, unspecified: Secondary | ICD-10-CM | POA: Diagnosis not present

## 2022-05-21 DIAGNOSIS — I700 Atherosclerosis of aorta: Secondary | ICD-10-CM | POA: Diagnosis not present

## 2022-05-21 NOTE — Telephone Encounter (Signed)
Pt back at [facility] place after he was in ED for a fall and fracture. She asked to r/s his appt on 12/7 because she has her appts and cannot take him to his. She will call me back later about the r/s date.

## 2022-05-21 NOTE — Telephone Encounter (Signed)
Appts-Per his wife- Please r/s his appts from 12/7 to 12/12,13 or 14 th . The earliest he can be here is 0930.  His wife has xrt at 74 those days and this would be convenient so she can get tx while he is seeing [facility] or getting infusion. Schedule message sent and pharmacy notified.

## 2022-05-22 DIAGNOSIS — R52 Pain, unspecified: Secondary | ICD-10-CM | POA: Diagnosis not present

## 2022-05-22 DIAGNOSIS — K5090 Crohn's disease, unspecified, without complications: Secondary | ICD-10-CM | POA: Diagnosis not present

## 2022-05-22 DIAGNOSIS — R5381 Other malaise: Secondary | ICD-10-CM | POA: Diagnosis not present

## 2022-05-22 DIAGNOSIS — M6281 Muscle weakness (generalized): Secondary | ICD-10-CM | POA: Diagnosis not present

## 2022-05-22 DIAGNOSIS — N390 Urinary tract infection, site not specified: Secondary | ICD-10-CM | POA: Diagnosis not present

## 2022-05-22 DIAGNOSIS — G9341 Metabolic encephalopathy: Secondary | ICD-10-CM | POA: Diagnosis not present

## 2022-05-22 DIAGNOSIS — E119 Type 2 diabetes mellitus without complications: Secondary | ICD-10-CM | POA: Diagnosis not present

## 2022-05-22 DIAGNOSIS — E43 Unspecified severe protein-calorie malnutrition: Secondary | ICD-10-CM | POA: Diagnosis not present

## 2022-05-22 DIAGNOSIS — M4856XA Collapsed vertebra, not elsewhere classified, lumbar region, initial encounter for fracture: Secondary | ICD-10-CM | POA: Diagnosis not present

## 2022-05-22 DIAGNOSIS — Z9181 History of falling: Secondary | ICD-10-CM | POA: Diagnosis not present

## 2022-05-23 ENCOUNTER — Ambulatory Visit: Payer: Medicare Other | Admitting: Podiatry

## 2022-05-23 DIAGNOSIS — R4182 Altered mental status, unspecified: Secondary | ICD-10-CM | POA: Diagnosis not present

## 2022-05-24 ENCOUNTER — Non-Acute Institutional Stay: Payer: 59 | Admitting: Family Medicine

## 2022-05-24 VITALS — BP 98/58 | HR 80 | Temp 98.2°F | Resp 18 | Wt 149.2 lb

## 2022-05-24 DIAGNOSIS — Z9181 History of falling: Secondary | ICD-10-CM

## 2022-05-24 DIAGNOSIS — N135 Crossing vessel and stricture of ureter without hydronephrosis: Secondary | ICD-10-CM

## 2022-05-24 DIAGNOSIS — J189 Pneumonia, unspecified organism: Secondary | ICD-10-CM | POA: Diagnosis not present

## 2022-05-24 DIAGNOSIS — K7460 Unspecified cirrhosis of liver: Secondary | ICD-10-CM | POA: Diagnosis not present

## 2022-05-24 DIAGNOSIS — E43 Unspecified severe protein-calorie malnutrition: Secondary | ICD-10-CM | POA: Diagnosis not present

## 2022-05-24 DIAGNOSIS — C220 Liver cell carcinoma: Secondary | ICD-10-CM | POA: Diagnosis not present

## 2022-05-24 DIAGNOSIS — C4492 Squamous cell carcinoma of skin, unspecified: Secondary | ICD-10-CM

## 2022-05-24 DIAGNOSIS — K7581 Nonalcoholic steatohepatitis (NASH): Secondary | ICD-10-CM | POA: Diagnosis not present

## 2022-05-24 DIAGNOSIS — R1313 Dysphagia, pharyngeal phase: Secondary | ICD-10-CM

## 2022-05-24 DIAGNOSIS — G9341 Metabolic encephalopathy: Secondary | ICD-10-CM | POA: Diagnosis not present

## 2022-05-24 DIAGNOSIS — D649 Anemia, unspecified: Secondary | ICD-10-CM | POA: Diagnosis not present

## 2022-05-24 DIAGNOSIS — Z515 Encounter for palliative care: Secondary | ICD-10-CM

## 2022-05-24 DIAGNOSIS — F32A Depression, unspecified: Secondary | ICD-10-CM | POA: Diagnosis not present

## 2022-05-24 NOTE — Progress Notes (Signed)
[facility] Palliative Care Consult Note Telephone: [phone]  Fax: [phone]    Date of encounter: 05/24/22 10:51 AM PATIENT NAME: [name] [place] [id number]   [id number] (home)  DOB: 1961/03/08 MRN: [id number] PRIMARY CARE PROVIDER:    [name], MD,  [place] [address] [address] [phone]  REFERRING PROVIDER:   [name], MD [place] [address],  [name] [address] [phone]  RESPONSIBLE PARTY:    Contact Information     Name Relation Home Work Mobile   [name] [phone]  [phone]   [name] Daughter [phone]  [phone]        I met face to face with patient, sister and brother in law in [name]. Palliative Care was asked to follow this patient by consultation request of  [name] Pepper, MD to address advance care planning and complex medical decision making. This is a follow up visit   ASSESSMENT , SYMPTOM MANAGEMENT AND PLAN / RECOMMENDATIONS:   Pharyngeal dysphagia Working with ST at the facility. CXR with new onset pneumonia and treatment deferred to facility NP. Question potential aspiration, pt already on soft diet due to loss of lower teeth.  2.  Hepatocellular Carcinoma Currently having infusion of Libtayo at [name], next due 06/06/22.   Discuss with [name] and Oncologist following pt at [name] recent events to see if continuation of infusion is indicated given overall decline in patient's health.  3.  Protein calorie malnutrition, severe Wife is not interested in feeding tube placement Appetite has declined to bites with possible intake of 1- 2 protein supplements daily Albumin low at 2.7 on 05/20/22  4.  Recurrent falls Unsteady gait, confusion and pain meds along with poor safety awareness contributing to fall risk. Has had setback in PT/OT with recent fall and  compression fracture with need to wear brace. Continues PT/OT  5.  Left ureteral obstruction and nephrolithiasis Pt drinks only Coke or protein supplement. Has left stent and is scheduled for surgical removal on 06/11/22 Will discuss with [name], Urologist if stent can remain if pt is terminal or if needs to come out especially given current  presentation/potential change in goals of care.   6.   Squamos cell carcinoma Dermatologist has pt using Isotretinoin for prophylaxis against squamos cell carcinoma progression of skin lesions. Currently significant exfoliation of hands/feet with risk for infection given pt picking at skin and pruritus. Topical emollients with menthol have not alleviated pruritus. Discuss risk vs benefit of continued use for patient in current state of health and if application of topical steroids may be helpful in controlling pruritus and healing.  7.  Palliative Care Encounter At present time pt appears incapable of understanding risks and benefits of treatment and whether this acute confusion is a manifestation of his liver disease, HCC, his pain medications, some underlying dementia  or a combination is unclear at the present time. Pt is at significant risk for self harm given his lack of safety awareness. Continue to monitor his progress or lack thereof but this provider is of the professional opinion that informed consent would have to be obtained from the wife if he continues to present this way. Extensive discussion with wife on goals of care, potential risks vs benefits of continuing current therapies as pt has hip fracture just prior to diagnosis of [name], has twice had sepsis, has had noted decline in weight and albumin  with current poor intake and given falls significant risk/deterioration of ADLs/IADLs. Palliative Provider will be coordinating with treating providers of each specialty to address plan for wife's education and consideration for decision  making. Once skilled days are exhauste and/or pt transitions to [name] she will be self pay at the facility and states some meds for his Crohn's/GI issues are expensive and not covered on formulary.  Advance Care Planning/Goals of Care: Goals include to maximize quality of life and symptom management. Health care surrogate gave her permission to discuss. Our advance care planning conversation included a discussion about:    The value and importance of advance care planning  Experiences with loved ones who have been seriously ill or have died-has her mother and a special needs adult son that she is also caring for who are ill Exploration of personal, cultural or spiritual beliefs that might influence medical decisions-states she doesn't believe in aggressive therapy just to prolong life without any quality.  Wants to look at upcoming procedures and treatments in context of risk vs benefit particularly with difficulty in getting pt to medical appointments. Currently wife is undergoing XRT for breast cancer and caring for special needs child.  She doesn't fear death for loved ones but doesn't know how she will cope if she loses spouse, son and mother in a short span of time. Does not want to consider feeding tube. Exploration of goals of care in the event of a sudden injury or illness-does not think she wants to resuscitate or intubate.  May consider Hospice after upcoming treatments if they will provide qualitative benefit to patient. Identification of a healthcare agent-Wife, [name] Review of existing MOST which specifies full code and options for MOST.  CODE STATUS: Full intervention currently, considering changing to DNR/DNI.  Time spent on phone discussing ACP counseling and goals : 44 min 34 sec. ______________________________________________________________________________________________________   Follow up Palliative Care Visit: Palliative care will continue to follow for complex  medical decision making, advance care planning, and clarification of goals. Return 2-3 weeks or prn.    This visit was coded based on medical decision making (MDM).  PPS: 40%  HOSPICE ELIGIBILITY/DIAGNOSIS: TBD  Chief Complaint:  Palliative Care is continuing to follow patient for chronic medical management in setting of cirrhosis and hepatocellular carcinoma with recent fall and L3 compression fracture.  Palliative Care is also following to assist with advanced care planning, refining and defining goals of care.   HISTORY OF PRESENT ILLNESS:  [name] is a 61 y.o. year old male with cirrhosis, hepatocellular carcinoma, dysphagia with new diagnosis of pneumonia on CXR 05/23/22, recurrent falls with hip fracture in June and with L3 compression fracture earlier this week, severe protein calorie malnutrition, indwelling foley cath with stent to alleviate ureteral obstruction on the left, OSA on CPAP, altered mental status, IDA, Crohn's disease, pancytopenia, recent urosepsis from renal calculi, and complex partial seizures.  Pt fell on Monday, was sent to ED and noted to have L3 compression fracture and was placed in corset brace (only to remove when showering).  Sister indicated that pt yesterday received pain meds and was found to be poorly arousable and "out of it".  She states improvement today but last night pt was found in his bathroom in the facility standing up naked.  Pt denies any memory of this today but also proceeded to tell me that his wife was "at work where she works with me."  Wife has been retired per pt's sister.  He has difficulty in assimilating thoughts at the present time and most of his comments are non-sensical.  When asking pt if he knew what day it was he could not answer.  Yesterday facility NP sent off for portable CXR and indicated that pt has a pneumonia which she will treat with antibiotics.  His urine is tea colored and has been for quite some time since he was  initially treated for kidney stone per family.  When seen for ED visit for compression fracture UA was negative for infection.  Discussed with facility NP findings and plan for discussion with wife about goals of care as well as exfoliative reaction of hands/feet.  Spoke with wife later in the day by phone and she states that pt is on acitretinoin which could potentially affect his liver but was used to treat recurrent squamos cell skin cancers of his arms. She states that it has become particularly difficult to transport pt to provider appointments especially now that he has had a setback with the compression fracture. See ACP discussion with wife below.  She reports pt has multiple specialist upcoming appointments in the near future with Oncology, Urology ([facility]), and  GI to address Crohns.  If these appointments are not needed given his current presentation or would not provide significant benefit to his overall quality of life she may consider cancellation.  She states that pt believes that he is going to return home ambulatory and independent with function to assist with home maintenance.  He has not been ready to discuss his possible death but she states she thinks he is not afraid of death but of the dying process.    History obtained from review of EMR, discussion with primary team, and interview with family, facility staff and/or [name].   05/20/22 Albumin 2.7    Latest Ref Rng & Units 05/20/2022    7:10 PM 03/31/2022    5:32 AM 03/30/2022    5:16 AM  CBC  WBC 4.0 - 10.5 K/uL 5.1  4.8  6.0   Hemoglobin 13.0 - 17.0 g/dL 10.9  8.8  8.9   Hematocrit 39.0 - 52.0 % 32.7  27.8  27.6   Platelets 150 - 400 K/uL 106  109  97        Latest Ref Rng & Units 05/20/2022    7:10 PM 03/30/2022    5:16 AM 03/28/2022    7:50 PM  CMP  Glucose 70 - 99 mg/dL 72  114  85   BUN 6 - 20 mg/dL _0 Creatinine 0.61 - 1.24 mg/dL 0.83  0.63  0.79   Sodium 135 - 145 mmol/L 141  138  142    Potassium 3.5 - 5.1 mmol/L 3.7  3.5  3.9   Chloride 98 - 111 mmol/L 108  110  111   CO2 22 - 32 mmol/L _1 Calcium 8.9 - 10.3 mg/dL 8.7  8.3  9.3   Total Protein 6.5 - 8.1 g/dL 5.0  5.0  6.6   Total Bilirubin 0.3 - 1.2 mg/dL 0.7  0.9  1.6   Alkaline Phos 38 - 126 U/L 277  244  373   AST 15 - 41 U/L 51  37  35   ALT 0 - 44 U/L 36  21  24   05/20/22 INR/Prothrombin Time 15.5/1.2            Component Ref Range & Units  4 d ago (05/20/22) 1 mo ago (03/28/22) 2 mo ago (03/17/22) 5 mo ago (12/17/21) 1 yr ago (12/04/20) 2 yr ago (01/12/20) 2 yr ago (12/02/19)  Color, Urine YELLOW AMBER Abnormal  YELLOW YELLOW YELLOW YELLOW    Comment: BIOCHEMICALS MAY BE AFFECTED BY COLOR  APPearance CLEAR CLOUDY Abnormal  CLEAR HAZY Abnormal  HAZY Abnormal  CLEAR    Specific Gravity, Urine 1.005 - 1.030 1.025 1.013 1.013 1.015 1.018  1.020  pH 5.0 - 8.0 6.5 7.0 6.0 5.0 7.5  6.0  Glucose, UA NEGATIVE mg/dL _0  Negative R NEGATIVE  Hgb urine dipstick NEGATIVE LARGE Abnormal  LARGE Abnormal  SMALL Abnormal  LARGE Abnormal  LARGE Abnormal   LARGE Abnormal   Bilirubin Urine NEGATIVE SMALL Abnormal  NEGATIVE NEGATIVE NEGATIVE NEGATIVE  MODERATE Abnormal   Ketones, ur NEGATIVE mg/dL _1   TRACE Abnormal   Protein, ur NEGATIVE mg/dL 100 Abnormal  30 Abnormal  NEGATIVE 100 Abnormal  30 Abnormal   >=300 Abnormal   Nitrite NEGATIVE NEGATIVE NEGATIVE POSITIVE Abnormal  NEGATIVE NEGATIVE  NEGATIVE  Leukocytes,Ua NEGATIVE TRACE Abnormal  NEGATIVE SMALL Abnormal  NEGATIVE NEGATIVE  LARGE Abnormal  CM  Comment: Performed at [facility], [address] [place], [place] [address]  RBC / HPF   >50 High  R 11-20 R >50 High  R 21-50 R    Leukocytes,UA       Negative R   Protein,UA       Negative R   Ketones, UA       Negative R   RBC, UA       Negative R   Bilirubin, UA       Negative R   Urobilinogen, Ur       1.0 R   Nitrite,  UA       Negative R   Microscopic Examination       Comment CM   Urobilinogen, UA        4.0 High  R  WBC, UA   6-10 R 11-20 R 11-20 R 0-5 R    Bacteria, UA   NONE SEEN R RARE Abnormal  R RARE Abnormal  R     Squamous Epithelial / LPF   0-5 R 0-5 R      Mucus   PRESENT CM PRESENT CM PRESENT CM PRESENT CM    Specific Gravity, UA       1.015 R   pH, UA       7.0 R   Color, UA       Yellow R   Appearance Ur       Clear R   05/10/22 CT lumbar spine: IMPRESSION: 1. 40% acute compression fracture along the superior endplate of L3 with mild bony fragmentation along the anterior superior endplate and 1-2 mm of posterior bony retropulsion along the posterosuperior endplate indicating both anterior and middle column involvement. No posterior column involvement is identified. 2. Lumbar spondylosis and degenerative disc disease causing mild impingement at L4-5 and moderate to prominent right foraminal stenosis at L5-S1. 3. Bilateral nonobstructive renal calculi. 4. Left double-J ureteral stent noted. 4 mm left ureteral stone adjacent to the stent. 5. Aortic atherosclerosis.  I reviewed EMR for available labs, medications, imaging, studies and related documents.  There are no new records since last visit/Records reviewed and summarized above.   ROS Limited due to confusion, family at bedside supplements hx  Abdomen: endorses need  for weight loss, poor appetite/intake of supplements, has "crohns", endorses intermittent incontinence of bowel GU: denies dysuria, endorses intermittent incontinence of urine MSK:  endorses increased weakness, had fall on Monday with L3 compression fracture wearing a back brace Skin: itching palms and pain in hands Neurological: endorses some pain in hands, denies insomnia Psych: Endorses positive mood Heme/lymph/immuno: denies bruises, abnormal bleeding  Physical Exam: Current and past weights: 149 lbs 3.2 oz as of  05/12/22, On 03/29/22 weight was 153 lbs 7  oz Constitutional: NAD General: frail appearing, thin/underweight Head:  Abrasion on top of left of center frontal bone  EYES: anicteric sclera, lids intact, no discharge  ENMT: intact hearing CV: S1S2, RRR, no LE edema Pulmonary: Diminished breath sounds throughout, no increased work of breathing, no cough, room air Abdomen:  hypo-active BS + 4 quadrants, soft and non tender, no ascites GU: deferred MSK: noted muscle wasting moves all extremities, wearing back brace Skin: warm and dry, noted exfoliative rashes in bed of erythema to bilateral hands, some on trunk  Neuro:  noted generalized weakness,  noted significant cognitive impairment Psych: non-anxious affect, A and O to self and family at bedside Hem/lymph/immuno: no widespread bruising   Thank you for the opportunity to participate in the care of [name].  The palliative care team will continue to follow. Please call our office at [phone] if we can be of additional assistance.   Marijo Conception, FNP -C  COVID-19 PATIENT SCREENING TOOL Asked and negative response unless otherwise noted:   Have you had symptoms of covid, tested positive or been in contact with someone with symptoms/positive test in the past 5-10 days?  no

## 2022-05-26 ENCOUNTER — Encounter: Payer: Self-pay | Admitting: Family Medicine

## 2022-05-26 DIAGNOSIS — Z515 Encounter for palliative care: Secondary | ICD-10-CM | POA: Insufficient documentation

## 2022-05-27 ENCOUNTER — Telehealth: Payer: Self-pay | Admitting: Family Medicine

## 2022-05-27 DIAGNOSIS — N390 Urinary tract infection, site not specified: Secondary | ICD-10-CM | POA: Diagnosis not present

## 2022-05-27 DIAGNOSIS — J189 Pneumonia, unspecified organism: Secondary | ICD-10-CM | POA: Diagnosis not present

## 2022-05-27 DIAGNOSIS — E43 Unspecified severe protein-calorie malnutrition: Secondary | ICD-10-CM | POA: Diagnosis not present

## 2022-05-27 DIAGNOSIS — E119 Type 2 diabetes mellitus without complications: Secondary | ICD-10-CM | POA: Diagnosis not present

## 2022-05-27 DIAGNOSIS — R52 Pain, unspecified: Secondary | ICD-10-CM | POA: Diagnosis not present

## 2022-05-27 DIAGNOSIS — M4856XA Collapsed vertebra, not elsewhere classified, lumbar region, initial encounter for fracture: Secondary | ICD-10-CM | POA: Diagnosis not present

## 2022-05-27 DIAGNOSIS — K7460 Unspecified cirrhosis of liver: Secondary | ICD-10-CM | POA: Diagnosis not present

## 2022-05-27 DIAGNOSIS — Z9181 History of falling: Secondary | ICD-10-CM | POA: Diagnosis not present

## 2022-05-27 DIAGNOSIS — G9341 Metabolic encephalopathy: Secondary | ICD-10-CM | POA: Diagnosis not present

## 2022-05-27 DIAGNOSIS — K5090 Crohn's disease, unspecified, without complications: Secondary | ICD-10-CM | POA: Diagnosis not present

## 2022-05-27 DIAGNOSIS — M6281 Muscle weakness (generalized): Secondary | ICD-10-CM | POA: Diagnosis not present

## 2022-05-27 DIAGNOSIS — R400 Somnolence: Secondary | ICD-10-CM | POA: Diagnosis not present

## 2022-05-27 DIAGNOSIS — R5381 Other malaise: Secondary | ICD-10-CM | POA: Diagnosis not present

## 2022-05-27 DIAGNOSIS — R269 Unspecified abnormalities of gait and mobility: Secondary | ICD-10-CM | POA: Diagnosis not present

## 2022-05-27 NOTE — Telephone Encounter (Signed)
Received message back in Epic from Dr Fuller Plan (GI) indicating that given pt's decline he does not need to keep his appointment in the office.  He did advise that if costs of Pamine forte was prohibitive that pt could be changed to Dicyclomine 10 mg TID or Glycopyralate 1 mg po BID and if ineffective in helping his abd pain could be stopped.  Received Epic message response from Dr Milford Cage (urologist) as well indicating that if pt was moving more towards a Hospice/Palliative Care model that he did not advocate for proceeding with invasive procedure at this point and he could be supportively managed.  TCT pt's wife and updated about the information from GI and Urology.  She plans to call and cancel his appointments and procedure.  Advised that I have not heard back yet from Dr Harriette Ohara, oncologist at [facility], Dr Earlie Server oncologist at [facility] or from Dr Elvera Lennox, Dermatologist.  She states pt had a difficult weekend, that he is wandering some in the facility, thinks he is home and is looking for her.  His behavior is worse in the evenings and he thinks that she is talking to other people instead of being with him.  He was increasingly confused over the weekend and accusatory towards wife.  He is getting up some with PT and the facility scheduled the first available follow up for some time in January with neurosurgery-Dr Pool's office.  She is concerned about the delay until that appointment and whether or not pt would need to go.  Advised that it would depend on whether or not she was willing to consider surgery.  She is unsure unless there is a determined benefit.  Will send message to Dr Annette Stable regarding ongoing medical needs/challenges and results of CT to see if he can elaborate further on whether or not there is anything that can be done to help patient's quality of life. She states that with the pneumonia pt had desaturated over the weekend to the upper 80s needing O2 supplementation.  She is unsure about his  prognostic time frame.  Advised it is difficult to determine at present due to need for pain meds and acute infection with hypoxia how much is his underlying state and how much is progression towards potential end of life.  Advised that any of these could contribute to worsening confusion but pt may have some underlying dementia.  After his first fall a few weeks ago he had a head CT which reported "redemonstrated frontal volume loss."  There was no mention of prior or current infarct on this 03/2022 head CT.  Question whether or not pt may have a frontotemporal dementia. Advised wife that it is not uncommon if pt has an underlying dementia for pain meds/ acute illness especially with hypoxia to cause a superimposed delirium and that when he is better we will have a better idea if this was a temporary decline or if this will become the new baseline.  Wife is a little overwhelmed but continues coping.  Will take copy of "Gone From My Sight" for wife to reference stages at end of life.  Wife plans to cancel upcoming appointments for GI, retinal therapy, and for the cystoscopy with stent removal.  We are awaiting update from Oncologist's Dr Harriette Ohara at [facility] and Dr Earlie Server at [facility] with regard to infusion therapy.  Sent message to Dr Earnie Larsson in Epic regarding current state and risk/benefit for pt to come in to the office or have  any invasive procedure.  He sent back a response stating that he agreed the patient did not need to come to the office that there was little benefit given his condition.  Returned call to wife and discussed that Dr Annette Stable advised cancelling the office visit in January and would take care of cancelling it.  Damaris Hippo FNP-C

## 2022-05-28 DIAGNOSIS — K5090 Crohn's disease, unspecified, without complications: Secondary | ICD-10-CM | POA: Diagnosis not present

## 2022-05-28 DIAGNOSIS — N1330 Unspecified hydronephrosis: Secondary | ICD-10-CM | POA: Diagnosis not present

## 2022-05-28 DIAGNOSIS — G40909 Epilepsy, unspecified, not intractable, without status epilepticus: Secondary | ICD-10-CM | POA: Diagnosis not present

## 2022-05-28 DIAGNOSIS — D099 Carcinoma in situ, unspecified: Secondary | ICD-10-CM | POA: Diagnosis not present

## 2022-05-28 DIAGNOSIS — K7581 Nonalcoholic steatohepatitis (NASH): Secondary | ICD-10-CM | POA: Diagnosis not present

## 2022-05-28 DIAGNOSIS — K7460 Unspecified cirrhosis of liver: Secondary | ICD-10-CM | POA: Diagnosis not present

## 2022-05-28 DIAGNOSIS — N201 Calculus of ureter: Secondary | ICD-10-CM | POA: Diagnosis not present

## 2022-05-28 DIAGNOSIS — S32000A Wedge compression fracture of unspecified lumbar vertebra, initial encounter for closed fracture: Secondary | ICD-10-CM | POA: Diagnosis not present

## 2022-05-28 DIAGNOSIS — L271 Localized skin eruption due to drugs and medicaments taken internally: Secondary | ICD-10-CM | POA: Diagnosis not present

## 2022-05-28 DIAGNOSIS — C801 Malignant (primary) neoplasm, unspecified: Secondary | ICD-10-CM | POA: Diagnosis not present

## 2022-05-28 DIAGNOSIS — G9340 Encephalopathy, unspecified: Secondary | ICD-10-CM | POA: Diagnosis not present

## 2022-05-28 DIAGNOSIS — D696 Thrombocytopenia, unspecified: Secondary | ICD-10-CM | POA: Diagnosis not present

## 2022-05-29 ENCOUNTER — Non-Acute Institutional Stay: Payer: 59 | Admitting: Family Medicine

## 2022-05-29 ENCOUNTER — Telehealth: Payer: Self-pay | Admitting: *Deleted

## 2022-05-29 VITALS — BP 128/60 | HR 78 | Temp 98.2°F | Resp 18

## 2022-05-29 DIAGNOSIS — C220 Liver cell carcinoma: Secondary | ICD-10-CM | POA: Diagnosis not present

## 2022-05-29 DIAGNOSIS — D649 Anemia, unspecified: Secondary | ICD-10-CM | POA: Diagnosis not present

## 2022-05-29 DIAGNOSIS — K5080 Crohn's disease of both small and large intestine without complications: Secondary | ICD-10-CM

## 2022-05-29 DIAGNOSIS — R5381 Other malaise: Secondary | ICD-10-CM | POA: Diagnosis not present

## 2022-05-29 DIAGNOSIS — R296 Repeated falls: Secondary | ICD-10-CM

## 2022-05-29 DIAGNOSIS — S32030S Wedge compression fracture of third lumbar vertebra, sequela: Secondary | ICD-10-CM

## 2022-05-29 DIAGNOSIS — M4856XA Collapsed vertebra, not elsewhere classified, lumbar region, initial encounter for fracture: Secondary | ICD-10-CM | POA: Diagnosis not present

## 2022-05-29 DIAGNOSIS — E43 Unspecified severe protein-calorie malnutrition: Secondary | ICD-10-CM | POA: Diagnosis not present

## 2022-05-29 DIAGNOSIS — J189 Pneumonia, unspecified organism: Secondary | ICD-10-CM | POA: Diagnosis not present

## 2022-05-29 DIAGNOSIS — M6281 Muscle weakness (generalized): Secondary | ICD-10-CM | POA: Diagnosis not present

## 2022-05-29 DIAGNOSIS — Z7189 Other specified counseling: Secondary | ICD-10-CM | POA: Diagnosis not present

## 2022-05-29 DIAGNOSIS — G40909 Epilepsy, unspecified, not intractable, without status epilepticus: Secondary | ICD-10-CM | POA: Diagnosis not present

## 2022-05-29 DIAGNOSIS — R1311 Dysphagia, oral phase: Secondary | ICD-10-CM | POA: Diagnosis not present

## 2022-05-29 DIAGNOSIS — R41841 Cognitive communication deficit: Secondary | ICD-10-CM | POA: Diagnosis not present

## 2022-05-29 DIAGNOSIS — Z515 Encounter for palliative care: Secondary | ICD-10-CM | POA: Diagnosis not present

## 2022-05-29 DIAGNOSIS — N390 Urinary tract infection, site not specified: Secondary | ICD-10-CM | POA: Diagnosis not present

## 2022-05-29 DIAGNOSIS — G9340 Encephalopathy, unspecified: Secondary | ICD-10-CM

## 2022-05-29 DIAGNOSIS — G9341 Metabolic encephalopathy: Secondary | ICD-10-CM | POA: Diagnosis not present

## 2022-05-29 DIAGNOSIS — K7460 Unspecified cirrhosis of liver: Secondary | ICD-10-CM

## 2022-05-29 DIAGNOSIS — R52 Pain, unspecified: Secondary | ICD-10-CM | POA: Diagnosis not present

## 2022-05-29 DIAGNOSIS — R2689 Other abnormalities of gait and mobility: Secondary | ICD-10-CM | POA: Diagnosis not present

## 2022-05-29 DIAGNOSIS — K5090 Crohn's disease, unspecified, without complications: Secondary | ICD-10-CM | POA: Diagnosis not present

## 2022-05-29 DIAGNOSIS — Z9181 History of falling: Secondary | ICD-10-CM | POA: Diagnosis not present

## 2022-05-29 DIAGNOSIS — E119 Type 2 diabetes mellitus without complications: Secondary | ICD-10-CM | POA: Diagnosis not present

## 2022-05-29 DIAGNOSIS — Z66 Do not resuscitate: Secondary | ICD-10-CM | POA: Diagnosis not present

## 2022-05-29 NOTE — Telephone Encounter (Signed)
     Patient  visit on 05/20/2022  at [facility] was for treatment   Have you been able to follow up with your primary care physician? patient is going to palliative care and will not need follow up right now  The patient was  able to obtain any needed medicine or equipment.  Are there diet recommendations that you are having difficulty following?  Patient expresses understanding of discharge instructions and education provided has no other needs at this time.    [place] [phone] [address] , [place] [address] Email : [email or web address]

## 2022-05-30 ENCOUNTER — Ambulatory Visit: Payer: Medicare Other | Admitting: Internal Medicine

## 2022-05-30 ENCOUNTER — Ambulatory Visit: Payer: Medicare Other

## 2022-05-30 ENCOUNTER — Encounter: Payer: Self-pay | Admitting: Family Medicine

## 2022-05-30 ENCOUNTER — Other Ambulatory Visit: Payer: Medicare Other

## 2022-05-30 DIAGNOSIS — G9341 Metabolic encephalopathy: Secondary | ICD-10-CM | POA: Diagnosis not present

## 2022-05-30 DIAGNOSIS — R2689 Other abnormalities of gait and mobility: Secondary | ICD-10-CM | POA: Diagnosis not present

## 2022-05-30 DIAGNOSIS — Z66 Do not resuscitate: Secondary | ICD-10-CM | POA: Diagnosis not present

## 2022-05-30 DIAGNOSIS — Z6821 Body mass index (BMI) 21.0-21.9, adult: Secondary | ICD-10-CM | POA: Diagnosis not present

## 2022-05-30 DIAGNOSIS — S32030A Wedge compression fracture of third lumbar vertebra, initial encounter for closed fracture: Secondary | ICD-10-CM | POA: Insufficient documentation

## 2022-05-30 DIAGNOSIS — Z9189 Other specified personal risk factors, not elsewhere classified: Secondary | ICD-10-CM | POA: Diagnosis not present

## 2022-05-30 DIAGNOSIS — Z9181 History of falling: Secondary | ICD-10-CM | POA: Diagnosis not present

## 2022-05-30 DIAGNOSIS — M6281 Muscle weakness (generalized): Secondary | ICD-10-CM | POA: Diagnosis not present

## 2022-05-30 DIAGNOSIS — K7682 Hepatic encephalopathy: Secondary | ICD-10-CM | POA: Diagnosis not present

## 2022-05-30 DIAGNOSIS — R1311 Dysphagia, oral phase: Secondary | ICD-10-CM | POA: Diagnosis not present

## 2022-05-30 DIAGNOSIS — R41841 Cognitive communication deficit: Secondary | ICD-10-CM | POA: Diagnosis not present

## 2022-05-30 DIAGNOSIS — E860 Dehydration: Secondary | ICD-10-CM | POA: Diagnosis not present

## 2022-05-30 DIAGNOSIS — E7220 Disorder of urea cycle metabolism, unspecified: Secondary | ICD-10-CM | POA: Diagnosis not present

## 2022-05-30 DIAGNOSIS — R627 Adult failure to thrive: Secondary | ICD-10-CM | POA: Diagnosis not present

## 2022-05-30 NOTE — Progress Notes (Signed)
[facility] [facility] Palliative Care Consult Note Telephone: [phone]  Fax: [phone]    Date of encounter: 05/30/22 8:39 PM PATIENT NAME: [name] [name] [place] [id number]   [phone] (home)  DOB: 06/12/1961 MRN: [id number] PRIMARY CARE PROVIDER:    [name], MD,  [place] [address] [phone]  REFERRING PROVIDER:   [name], MD [address] [place],  [place] [address] [phone]  RESPONSIBLE PARTY:    Contact Information     Name Relation Home Work Mobile   Delia Spouse [phone]  [phone]   [name] [name], [name] [name] Daughter [phone]  [phone]        I met face to face with patient and wife in [facility]hab facility. Palliative Care was asked to follow this patient by consultation request of  [name], MD to address advance care planning and complex medical decision making. This is a follow up visit   ASSESSMENT , SYMPTOM MANAGEMENT AND PLAN / RECOMMENDATIONS:  Acute encephalopathy CT head most recently showed frontal volume loss. Likely multi-etiology from general decline in condition, possible infection, unlikely elevated ammonia level given recent flare in Crohn's, dehydration or possible terminal delirium Needs pain meds and muscle relaxers to help with pain when moving. D/c meds that are ineffective in allowing pt to sleep but may be contributing overall to confusion-Trazodone or those that have anticholinergic properties-Detrol LA and Myrbetriq  Hepatocellular carcinoma/ cirrhosis Has anemia and thrombocytopenia from cirrhosis, stable PLT at 106. Referral to Hospice services Continue Tylenol 1000 mg TID, Gabapentin 300 mg TID, Xifaxan 550 mg BID   Crohn's Disease without complication Continue Mesalamine 2.4 gm BID. D/C pamine forte and consider hyoscyamine 0.1 mg QID for abd pain.   Frequent falls Poor safety  awareness with acute worsening of confusion PT having pt use wheelchair for transfers to and from bed. Poor recall leads to pt not remembering to call for help when needing to get up and currently expressing sundowning type behavior   Severe protein calorie malnutrition Comfort feedings, continue to try house nutritional supplement BID, increase to TID if not eating more than bites.   Compression fracture of L3 vertebra Continue Gabapentin, Robaxin and Oxycodone 5 mg at 8 am with 1/2 tab BID at 1 pm and 8 pm. Continue Tylenol as above. Continue use of corset back brace unless showering.    Palliative Care Encounter DNR completed with wife as with pt's ongoing confusion, psychosis and agitation limit his capacity to understand risks/benefits of his treatment or lack thereof. Referral to Hospice and encouragement of SW/Chaplain support of pt and wife as wife is overwhelmed with pt's current presentation and caring for their special needs adult, total care child while also herself being treated for breast cancer with radiation daily and intermittent chemotherapy. D/c all nonessential meds:  acidophillus, calcium supplement, cancelled Libtayo infusion through cancer center, vitamin d supplement, esomeprazole, folvite, Myrbetriq, Detrol LA. Give Triamcinolone 0.1% apply thin film to affected areas with rash on hands/feet/arm and trunk BID.    Advance Care Planning/Goals of Care: Goals include to maximize quality of life and symptom management. Health care surrogate gave her permission to discuss. Our advance care planning conversation included a discussion about:    The value and importance of advance care planning-has current MOST specifying full code Exploration of personal, cultural or spiritual beliefs that might influence medical decisions-has been unable to accept he has a terminal illness.  She states thinking that he is not afraid of death but of the dying process. Exploration of goals of  care in the event of a sudden injury or illness-we discussed the swelling now present on the right parietal area of his head and whether or not he might have an underlying injury and would need to go to the hospital. Advised wife that going would mean the need for scans whether head CT or MRI and if there was a significant enough bleed that the only real treatment is surgery to alleviate the pressure on the brain.  Advised that it is doubtful if neurosurgery would consider doing surgery and otherwise he would just be monitored so there was no significant benefit to going in but it was her choice. Advised given his poor food and fluid intake, significant weight loss, osteoporosis with multiple falls and fractures that he would likely take an extended period of time to heal even if he did heal. Wife was advised if pt goes on Hospice Care that he will not receive restorative PT and she stated that pt has not been able to work with PT since he fell most recently and suffered L3 vertebral compression fracture. Identification of a healthcare agent-wife, [name] is [facility] POA Review and creation of an advance directive document-changed status to DNR. Discussed MOST form with wife and she wanted to look at the paperwork she has to fill out completely.  She does not want CPR.  She indicated she was pretty sure she didn't want intubation and basically wanted to focus on his comfort at the facility.  She was unsure about IV fluids and antibiotics but was pretty sure she would not want to place a feeding tube Decision not to resuscitate or to de-escalate disease focused treatments due to poor prognosis. CODE STATUS: Changed to DNR and form uploaded to [facility] on Epic     Follow up Palliative Care Visit: Palliative care will  refer to Hospice   This visit was coded based on medical decision making (MDM).  PPS: 30%  HOSPICE ELIGIBILITY/DIAGNOSIS: Hepatocellular carcinoma with recent urosepsis due to renal calculi,  multiple falls with fractures to hip/ribs and back on 3 separate occasions since June, and protein calorie malnutrition with a 22.88 lb weight loss since Jan 19th, 2023.  PPS has declined from 60% ambulating with a rollator on 03/25/22 to 30% currently and pt is unable to safely ambulate.  Chief Complaint:  Palliative Care is continuing to follow patient for chronic medical management in setting of  hepatocellular carcinoma complicated by cirrhosis and Crohn's disease.  Palliative Care is also following to assist with advanced care planning, refining and defining goals of care. Had also received call from wife stating pt had fallen nightly for the last several nights, was not sleeping, was wandering at night and yelling/screaming at her on the phone, accusing her of having an affair.   HISTORY OF PRESENT ILLNESS:  BUREN HAVEY is a 61 y.o. year old male with Hepatocellular carcinoma, cirrhosis, hx of seizures, sepsis, renal calculi, frequent falls with fractures to hip, ribs and L3 on 3 different occasions since June. He has been on acitretinoin to help prevent SCC on his arms.  Wife states he has a hx of "picking" behaviors and that the rash on arms and feet is related to this med.  She had spoken with Dr Elvera Lennox the dermatologist who was in agreement to stop the Acitretinoin given his recent significant decline and look more at comfort.  Pt was working with PTA to get dressed and transfer from the bed to wheelchair to recliner on arrival.  Wife is present at bedside.  Pt is hostile towards wife and gets angry that she doesn't know what he is talking to her about.  He is accusing her of having an affair.  While present he is talking to her about "Liliane Channel from [employer] having a $75,000 coin that he wants her to buy to sell to her "boyfriend" who he gestures to as the PTA that was helping him get dressed.  Lab rep from [facility] came in to draw CBC, CMP and ammonia and states results should be available in  the afternoon.  Facility nurse pulls this provider aside to state that pt has thought he has been "at home" staying downstairs while his wife is partying upstairs.  He has heard the staff at night and accuses wife of not wanting to care for him.  Anything she says seems to make him angrier when she is trying to understand what he is asking her to do.  Nurse and wife indicate the patient has fallen daily for the last 2 days.  He is getting up at night wandering and trying to find his wife.  Facility nurse said that pt was yelling at his wife on the phone and was not able to be redirected.  Wife states he is eating "bites" and not drinking well.  She says that she has to go for her cancer treatment and he told her "Don't be surprised if you come home and the locks are changed.''  Pt has not been ready to discuss Hospice or end of life previously, refusing to accept that he has a terminal illness.  Wife is his HC POA. Previously we had discussed trying to complete some of the specialist work up and use up skilled days before possible transition to Hospice.  Wife had planned for pt to remain at the facility as she is being treated for breast cancer with radiation and plan is for her to have chemotherapy tomorrow.  She is primary caregiver for her elderly mother and an adult, disabled son who lives at home with her and is totally dependent. The last CT of his head showed significant frontal volume loss which could be consistent with a FT dementia versus acute delirium with head injury exacerbated by pain meds.  In current state pt would have difficulty retaining and following instructions to successfully rehab. He has poor safety awareness and significant confusion. Have contacted Dr Milford Cage, Urologist, who has indicated if wife is interested in Hospice and pt has had significant decline that he would not want to have him undergo cystoscopy for replacement of his stent nor would he need to come to the office but be  medically maximized so he would cancel the planned surgery.  Dr Lucio Edward indicated that pt's condition and difficulty with getting in to seek treatment for his flare in his Crohn's and general decline was not warranted.  He also advised that if Pamine forte is too expensive that Glycopyrralate BID or Hyoscyamine QID could be used to help with abd pain instead.  He states if this does not provide relief of abdominal pain then it is worth stopping them all.  Received message back from Abelina Bachelor, RN with Dr Lew Dawes office at the [facility] that pt had not yet received Libtayo in their center and this had been ordered by [facility] Dr Harriette Ohara.  He was in agreement with not giving the infusion next week if what the patient and his family wants is Hospice.  Had faxed a message to Dr Harriette Ohara but not yet received a response back. Also pt was scheduled in January to see  a NP/PA with Neurosurgeon Dr Earnie Larsson about his L3 compression fracture and given his decline didn't believe there was any benefit to him coming in to the office to see them so he was going to cancel it.  Spoke with Earmon Phoenix, NP at the facility who is pt's attending and she states pt's life expectancy with his current progression is likely to be less than 6 months and she is willing to remain as his attending.  Discussed with Dr Tomasa Hosteller one of my supervising physicians who works with Hospice to alert him to pt's agitation, possible undiagnosed FT dementia with recent head injury, hx of seizures and pt is already on Lamictal to see what could be done to stabilize pt's mood and sleep.  He states to give pt Seroquel 25 mg Q 6 hrs and Ativan 0.5 mg A 6 hrs.  Plan was discussed with wife who wants to move forward with Hospice, completed DNR and took MOST with her to review but will bring back for completion. Advised she could fill out choices but not sign or date form until with provider.  She verbalized understanding and agreement  with current plan.  History obtained from review of EMR, discussion with primary team, and interview with wife, facility staff and/or Mr. Dehn.    05/20/22 Last Albumin 2.7 I reviewed EMR for available labs, medications, imaging, studies and related documents.  There are no new records.  ROS Not a reliable historian given confusion Cardiovascular: denies chest pain, endorses DOE Pulmonary: denies cough, denies SOB Abdomen: endorses constipation MSK:  endorses increased weakness and pain with mobility,  multiple falls reported Skin: pruritic, exfoliative rash of bilat hands/feet Neurological: denies pain unless moving Psych: Endorses anger and agitation   Physical Exam: Current and past weights: 149 lbs 3.2 oz as of 05/24/22, weight 12/18/21 was 158 lbs 11.7 oz, weight 07/12/21 was 172 lbs 1.6 oz. Constitutional: NAD General: frail appearing, thin Head:  noted scalp swelling that is soft and boggy over right parietal bone EYES: anicteric sclera, Pupils equal, approx 4-5 mm Bilat and sluggish to react to light ENMT: intact hearing, lips dry, cracked with dried blood at right corner of lower lip CV: S1S2, RRR, no LE edema Pulmonary: CTAB, no increased work of breathing, no cough, room air Abdomen: normo-active BS + 4 quadrants, soft and non tender, no ascites GU: deferred MSK: noted sarcopenia BLE, wearing back corset brace, moves all extremities, Transfer to [facility] with max assist, unsteady gait Skin: warm and dry, has warmth and confluent  erythematous rash of bilat hands with spreading satellite lesions of the forearms and confluent erythematous warm rash of bilat feet with exfoliation of hands/fingers and feet Neuro:  noted generalized weakness and increased pain on standing,  noted cognitive impairment Psych: paranoia, confusion, agitation Hem/lymph/immuno: no widespread bruising   Thank you for the opportunity to participate in the care of Mr. Defrancesco.  The palliative care team will  continue to follow. Please call our office at [phone] if we can be of additional assistance.   Marijo Conception, FNP -C  COVID-19 PATIENT SCREENING TOOL Asked and negative response unless otherwise noted:   Have you had symptoms of covid, tested positive or been in contact with  someone with symptoms/positive test in the past 5-10 days?  unknown

## 2022-05-31 DIAGNOSIS — K7460 Unspecified cirrhosis of liver: Secondary | ICD-10-CM | POA: Diagnosis not present

## 2022-05-31 DIAGNOSIS — C220 Liver cell carcinoma: Secondary | ICD-10-CM | POA: Diagnosis not present

## 2022-05-31 DIAGNOSIS — E46 Unspecified protein-calorie malnutrition: Secondary | ICD-10-CM | POA: Diagnosis not present

## 2022-05-31 DIAGNOSIS — C44329 Squamous cell carcinoma of skin of other parts of face: Secondary | ICD-10-CM | POA: Diagnosis not present

## 2022-05-31 DIAGNOSIS — E785 Hyperlipidemia, unspecified: Secondary | ICD-10-CM | POA: Diagnosis not present

## 2022-05-31 DIAGNOSIS — D696 Thrombocytopenia, unspecified: Secondary | ICD-10-CM | POA: Diagnosis not present

## 2022-05-31 DIAGNOSIS — K5090 Crohn's disease, unspecified, without complications: Secondary | ICD-10-CM | POA: Diagnosis not present

## 2022-05-31 DIAGNOSIS — F32A Depression, unspecified: Secondary | ICD-10-CM | POA: Diagnosis not present

## 2022-05-31 DIAGNOSIS — I10 Essential (primary) hypertension: Secondary | ICD-10-CM | POA: Diagnosis not present

## 2022-05-31 DIAGNOSIS — R4182 Altered mental status, unspecified: Secondary | ICD-10-CM | POA: Diagnosis not present

## 2022-05-31 DIAGNOSIS — H409 Unspecified glaucoma: Secondary | ICD-10-CM | POA: Diagnosis not present

## 2022-05-31 DIAGNOSIS — E119 Type 2 diabetes mellitus without complications: Secondary | ICD-10-CM | POA: Diagnosis not present

## 2022-05-31 DIAGNOSIS — L853 Xerosis cutis: Secondary | ICD-10-CM | POA: Diagnosis not present

## 2022-05-31 DIAGNOSIS — L409 Psoriasis, unspecified: Secondary | ICD-10-CM | POA: Diagnosis not present

## 2022-05-31 DIAGNOSIS — K219 Gastro-esophageal reflux disease without esophagitis: Secondary | ICD-10-CM | POA: Diagnosis not present

## 2022-05-31 DIAGNOSIS — N1330 Unspecified hydronephrosis: Secondary | ICD-10-CM | POA: Diagnosis not present

## 2022-05-31 DIAGNOSIS — R1310 Dysphagia, unspecified: Secondary | ICD-10-CM | POA: Diagnosis not present

## 2022-05-31 DIAGNOSIS — S32039D Unspecified fracture of third lumbar vertebra, subsequent encounter for fracture with routine healing: Secondary | ICD-10-CM | POA: Diagnosis not present

## 2022-05-31 DIAGNOSIS — M810 Age-related osteoporosis without current pathological fracture: Secondary | ICD-10-CM | POA: Diagnosis not present

## 2022-05-31 DIAGNOSIS — H04129 Dry eye syndrome of unspecified lacrimal gland: Secondary | ICD-10-CM | POA: Diagnosis not present

## 2022-05-31 DIAGNOSIS — G40909 Epilepsy, unspecified, not intractable, without status epilepticus: Secondary | ICD-10-CM | POA: Diagnosis not present

## 2022-06-01 DIAGNOSIS — R4182 Altered mental status, unspecified: Secondary | ICD-10-CM | POA: Diagnosis not present

## 2022-06-01 DIAGNOSIS — E46 Unspecified protein-calorie malnutrition: Secondary | ICD-10-CM | POA: Diagnosis not present

## 2022-06-01 DIAGNOSIS — S32039D Unspecified fracture of third lumbar vertebra, subsequent encounter for fracture with routine healing: Secondary | ICD-10-CM | POA: Diagnosis not present

## 2022-06-01 DIAGNOSIS — K219 Gastro-esophageal reflux disease without esophagitis: Secondary | ICD-10-CM | POA: Diagnosis not present

## 2022-06-01 DIAGNOSIS — C44329 Squamous cell carcinoma of skin of other parts of face: Secondary | ICD-10-CM | POA: Diagnosis not present

## 2022-06-01 DIAGNOSIS — C220 Liver cell carcinoma: Secondary | ICD-10-CM | POA: Diagnosis not present

## 2022-06-02 DIAGNOSIS — K219 Gastro-esophageal reflux disease without esophagitis: Secondary | ICD-10-CM | POA: Diagnosis not present

## 2022-06-02 DIAGNOSIS — R4182 Altered mental status, unspecified: Secondary | ICD-10-CM | POA: Diagnosis not present

## 2022-06-02 DIAGNOSIS — C44329 Squamous cell carcinoma of skin of other parts of face: Secondary | ICD-10-CM | POA: Diagnosis not present

## 2022-06-02 DIAGNOSIS — S32039D Unspecified fracture of third lumbar vertebra, subsequent encounter for fracture with routine healing: Secondary | ICD-10-CM | POA: Diagnosis not present

## 2022-06-02 DIAGNOSIS — C220 Liver cell carcinoma: Secondary | ICD-10-CM | POA: Diagnosis not present

## 2022-06-02 DIAGNOSIS — E46 Unspecified protein-calorie malnutrition: Secondary | ICD-10-CM | POA: Diagnosis not present

## 2022-06-03 DIAGNOSIS — C44329 Squamous cell carcinoma of skin of other parts of face: Secondary | ICD-10-CM | POA: Diagnosis not present

## 2022-06-03 DIAGNOSIS — E46 Unspecified protein-calorie malnutrition: Secondary | ICD-10-CM | POA: Diagnosis not present

## 2022-06-03 DIAGNOSIS — K219 Gastro-esophageal reflux disease without esophagitis: Secondary | ICD-10-CM | POA: Diagnosis not present

## 2022-06-03 DIAGNOSIS — R4182 Altered mental status, unspecified: Secondary | ICD-10-CM | POA: Diagnosis not present

## 2022-06-03 DIAGNOSIS — C220 Liver cell carcinoma: Secondary | ICD-10-CM | POA: Diagnosis not present

## 2022-06-03 DIAGNOSIS — S32039D Unspecified fracture of third lumbar vertebra, subsequent encounter for fracture with routine healing: Secondary | ICD-10-CM | POA: Diagnosis not present

## 2022-06-05 ENCOUNTER — Encounter (INDEPENDENT_AMBULATORY_CARE_PROVIDER_SITE_OTHER): Payer: 59 | Admitting: Ophthalmology

## 2022-06-05 DIAGNOSIS — R4182 Altered mental status, unspecified: Secondary | ICD-10-CM | POA: Diagnosis not present

## 2022-06-05 DIAGNOSIS — S32039D Unspecified fracture of third lumbar vertebra, subsequent encounter for fracture with routine healing: Secondary | ICD-10-CM | POA: Diagnosis not present

## 2022-06-05 DIAGNOSIS — C220 Liver cell carcinoma: Secondary | ICD-10-CM | POA: Diagnosis not present

## 2022-06-05 DIAGNOSIS — E46 Unspecified protein-calorie malnutrition: Secondary | ICD-10-CM | POA: Diagnosis not present

## 2022-06-05 DIAGNOSIS — K219 Gastro-esophageal reflux disease without esophagitis: Secondary | ICD-10-CM | POA: Diagnosis not present

## 2022-06-05 DIAGNOSIS — C44329 Squamous cell carcinoma of skin of other parts of face: Secondary | ICD-10-CM | POA: Diagnosis not present

## 2022-06-06 ENCOUNTER — Other Ambulatory Visit: Payer: Self-pay | Admitting: *Deleted

## 2022-06-06 ENCOUNTER — Telehealth: Payer: Self-pay | Admitting: Family Medicine

## 2022-06-06 ENCOUNTER — Ambulatory Visit: Payer: Medicare Other

## 2022-06-06 ENCOUNTER — Ambulatory Visit: Payer: Medicare Other | Admitting: Internal Medicine

## 2022-06-06 ENCOUNTER — Other Ambulatory Visit: Payer: Medicare Other

## 2022-06-06 DIAGNOSIS — K219 Gastro-esophageal reflux disease without esophagitis: Secondary | ICD-10-CM | POA: Diagnosis not present

## 2022-06-06 DIAGNOSIS — E46 Unspecified protein-calorie malnutrition: Secondary | ICD-10-CM | POA: Diagnosis not present

## 2022-06-06 DIAGNOSIS — C44329 Squamous cell carcinoma of skin of other parts of face: Secondary | ICD-10-CM | POA: Diagnosis not present

## 2022-06-06 DIAGNOSIS — S32039D Unspecified fracture of third lumbar vertebra, subsequent encounter for fracture with routine healing: Secondary | ICD-10-CM | POA: Diagnosis not present

## 2022-06-06 DIAGNOSIS — R4189 Other symptoms and signs involving cognitive functions and awareness: Secondary | ICD-10-CM | POA: Diagnosis not present

## 2022-06-06 DIAGNOSIS — W19XXXA Unspecified fall, initial encounter: Secondary | ICD-10-CM | POA: Diagnosis not present

## 2022-06-06 DIAGNOSIS — R4182 Altered mental status, unspecified: Secondary | ICD-10-CM | POA: Diagnosis not present

## 2022-06-06 DIAGNOSIS — G40909 Epilepsy, unspecified, not intractable, without status epilepticus: Secondary | ICD-10-CM | POA: Diagnosis not present

## 2022-06-06 DIAGNOSIS — C220 Liver cell carcinoma: Secondary | ICD-10-CM | POA: Diagnosis not present

## 2022-06-06 NOTE — Patient Outreach (Signed)
[place] Coordinator follow up. Mr. Canelo resides in [facility].   Update received from SNF social worker. Mr. Pate has transitioned to Hospice under long term care at [facility].   No identifiable [facility] care coordination needs.    Marthenia Rolling, MSN, RN,BSN [place] [facility] Coordinator [phone] (Direct dial)

## 2022-06-06 NOTE — Telephone Encounter (Signed)
Received a call from [place] with [facility] at 12:45 pm today. She was asking about recent changes with pt's medications prior to starting on Hospice care. Advised that pt was started on Seroquel 25 mg and Ativan 0.5 mg q 6 hrs and after 24 hours the facility said he was too sleepy and wanted his doses changed and wanted him not to be on Seroquel stating state requirements that there was no psychotropic use in the facility.  Explained that patient with decompensated liver failure will have hyperammonemia contributing to confusion and psychosis.  Patient is having multiple loose stools per day given his Crohn's but not sure why this is not helping bring ammonia down.  She states that pt has been receiving Imodium to help control his Crohn's and someone had indicated that I had given the order.  Explained that he was already on Imodium when I came in that I had contacted the GI in pt's case to discuss his appointments, upcoming procedures.  Advised that I had discussed with facility attending NP to d/c his Pamine forte and add Bentyl to help with abdominal pain from Crohn's.  Advised that I made recommendations to d/c all non-essential meds including vitamin supplements except potassium and that since his last Magnesium was 1.6 and he was having diarrhea that this might be why his potassium was staying on the lower side.  Would recommend use of Mag Ox 400 mg daily to help keep his potassium normal.  Advised that I had recommended to facility attending to d/c Detrol LA, Myrbetriq, vitamin supplements, acitretinoin and start Triamcinolone ointment for his hands and feet to prevent infection and itching. Advised that attending NP had indicated she would start Depakote 125 mg TID since she had to stop his Seroquel and would schedule his Ativan 0.5 mg TID.  Nurse states pt is continuing to be very agitated and has fallen multiple times. She will discuss all med changes with Dr Gildardo Cranker with hospice to see  what she wants to do for pt. She states pt remains a full code but I told her that I had completed a DNR but the facility only accepts a MOST form and his that is on file states he is full intervention.  Discussed that on my discussion with the wife she had indicated DNR/DNI and no feeding tube but wasn't sure about IV fluids and antibiotics and on the day it was discussed with her she had to leave to get chemo of her own but plan was to have SW complete it with her.   Damaris Hippo FNP-C

## 2022-06-07 DIAGNOSIS — K219 Gastro-esophageal reflux disease without esophagitis: Secondary | ICD-10-CM | POA: Diagnosis not present

## 2022-06-07 DIAGNOSIS — C44329 Squamous cell carcinoma of skin of other parts of face: Secondary | ICD-10-CM | POA: Diagnosis not present

## 2022-06-07 DIAGNOSIS — S32039D Unspecified fracture of third lumbar vertebra, subsequent encounter for fracture with routine healing: Secondary | ICD-10-CM | POA: Diagnosis not present

## 2022-06-07 DIAGNOSIS — R4182 Altered mental status, unspecified: Secondary | ICD-10-CM | POA: Diagnosis not present

## 2022-06-07 DIAGNOSIS — E46 Unspecified protein-calorie malnutrition: Secondary | ICD-10-CM | POA: Diagnosis not present

## 2022-06-07 DIAGNOSIS — C220 Liver cell carcinoma: Secondary | ICD-10-CM | POA: Diagnosis not present

## 2022-06-11 ENCOUNTER — Encounter (HOSPITAL_COMMUNITY): Admission: RE | Payer: Self-pay | Source: Home / Self Care

## 2022-06-11 ENCOUNTER — Ambulatory Visit (HOSPITAL_COMMUNITY): Admission: RE | Admit: 2022-06-11 | Payer: Medicare Other | Source: Home / Self Care | Admitting: Urology

## 2022-06-11 DIAGNOSIS — S32039D Unspecified fracture of third lumbar vertebra, subsequent encounter for fracture with routine healing: Secondary | ICD-10-CM | POA: Diagnosis not present

## 2022-06-11 DIAGNOSIS — K219 Gastro-esophageal reflux disease without esophagitis: Secondary | ICD-10-CM | POA: Diagnosis not present

## 2022-06-11 DIAGNOSIS — R4182 Altered mental status, unspecified: Secondary | ICD-10-CM | POA: Diagnosis not present

## 2022-06-11 DIAGNOSIS — E46 Unspecified protein-calorie malnutrition: Secondary | ICD-10-CM | POA: Diagnosis not present

## 2022-06-11 DIAGNOSIS — C44329 Squamous cell carcinoma of skin of other parts of face: Secondary | ICD-10-CM | POA: Diagnosis not present

## 2022-06-11 DIAGNOSIS — C220 Liver cell carcinoma: Secondary | ICD-10-CM | POA: Diagnosis not present

## 2022-06-11 SURGERY — CYSTOURETEROSCOPY, WITH RETROGRADE PYELOGRAM AND STENT INSERTION
Anesthesia: General | Laterality: Left

## 2022-06-12 DIAGNOSIS — E46 Unspecified protein-calorie malnutrition: Secondary | ICD-10-CM | POA: Diagnosis not present

## 2022-06-12 DIAGNOSIS — C44329 Squamous cell carcinoma of skin of other parts of face: Secondary | ICD-10-CM | POA: Diagnosis not present

## 2022-06-12 DIAGNOSIS — C220 Liver cell carcinoma: Secondary | ICD-10-CM | POA: Diagnosis not present

## 2022-06-12 DIAGNOSIS — S32039D Unspecified fracture of third lumbar vertebra, subsequent encounter for fracture with routine healing: Secondary | ICD-10-CM | POA: Diagnosis not present

## 2022-06-12 DIAGNOSIS — K219 Gastro-esophageal reflux disease without esophagitis: Secondary | ICD-10-CM | POA: Diagnosis not present

## 2022-06-12 DIAGNOSIS — R4182 Altered mental status, unspecified: Secondary | ICD-10-CM | POA: Diagnosis not present

## 2022-06-13 ENCOUNTER — Ambulatory Visit: Payer: Medicare Other | Admitting: Gastroenterology

## 2022-06-13 DIAGNOSIS — E46 Unspecified protein-calorie malnutrition: Secondary | ICD-10-CM | POA: Diagnosis not present

## 2022-06-13 DIAGNOSIS — K219 Gastro-esophageal reflux disease without esophagitis: Secondary | ICD-10-CM | POA: Diagnosis not present

## 2022-06-13 DIAGNOSIS — G9341 Metabolic encephalopathy: Secondary | ICD-10-CM | POA: Diagnosis not present

## 2022-06-13 DIAGNOSIS — C220 Liver cell carcinoma: Secondary | ICD-10-CM | POA: Diagnosis not present

## 2022-06-13 DIAGNOSIS — R2689 Other abnormalities of gait and mobility: Secondary | ICD-10-CM | POA: Diagnosis not present

## 2022-06-13 DIAGNOSIS — S32039D Unspecified fracture of third lumbar vertebra, subsequent encounter for fracture with routine healing: Secondary | ICD-10-CM | POA: Diagnosis not present

## 2022-06-13 DIAGNOSIS — R4182 Altered mental status, unspecified: Secondary | ICD-10-CM | POA: Diagnosis not present

## 2022-06-13 DIAGNOSIS — M6281 Muscle weakness (generalized): Secondary | ICD-10-CM | POA: Diagnosis not present

## 2022-06-13 DIAGNOSIS — R41841 Cognitive communication deficit: Secondary | ICD-10-CM | POA: Diagnosis not present

## 2022-06-13 DIAGNOSIS — R1311 Dysphagia, oral phase: Secondary | ICD-10-CM | POA: Diagnosis not present

## 2022-06-13 DIAGNOSIS — C44329 Squamous cell carcinoma of skin of other parts of face: Secondary | ICD-10-CM | POA: Diagnosis not present

## 2022-06-13 DIAGNOSIS — Z9181 History of falling: Secondary | ICD-10-CM | POA: Diagnosis not present

## 2022-06-14 DIAGNOSIS — E46 Unspecified protein-calorie malnutrition: Secondary | ICD-10-CM | POA: Diagnosis not present

## 2022-06-14 DIAGNOSIS — R41841 Cognitive communication deficit: Secondary | ICD-10-CM | POA: Diagnosis not present

## 2022-06-14 DIAGNOSIS — M6281 Muscle weakness (generalized): Secondary | ICD-10-CM | POA: Diagnosis not present

## 2022-06-14 DIAGNOSIS — R1311 Dysphagia, oral phase: Secondary | ICD-10-CM | POA: Diagnosis not present

## 2022-06-14 DIAGNOSIS — R2689 Other abnormalities of gait and mobility: Secondary | ICD-10-CM | POA: Diagnosis not present

## 2022-06-14 DIAGNOSIS — S32039D Unspecified fracture of third lumbar vertebra, subsequent encounter for fracture with routine healing: Secondary | ICD-10-CM | POA: Diagnosis not present

## 2022-06-14 DIAGNOSIS — Z9181 History of falling: Secondary | ICD-10-CM | POA: Diagnosis not present

## 2022-06-14 DIAGNOSIS — G9341 Metabolic encephalopathy: Secondary | ICD-10-CM | POA: Diagnosis not present

## 2022-06-14 DIAGNOSIS — C44329 Squamous cell carcinoma of skin of other parts of face: Secondary | ICD-10-CM | POA: Diagnosis not present

## 2022-06-14 DIAGNOSIS — R4182 Altered mental status, unspecified: Secondary | ICD-10-CM | POA: Diagnosis not present

## 2022-06-14 DIAGNOSIS — C220 Liver cell carcinoma: Secondary | ICD-10-CM | POA: Diagnosis not present

## 2022-06-14 DIAGNOSIS — K219 Gastro-esophageal reflux disease without esophagitis: Secondary | ICD-10-CM | POA: Diagnosis not present

## 2022-06-15 DIAGNOSIS — S32039D Unspecified fracture of third lumbar vertebra, subsequent encounter for fracture with routine healing: Secondary | ICD-10-CM | POA: Diagnosis not present

## 2022-06-15 DIAGNOSIS — C44329 Squamous cell carcinoma of skin of other parts of face: Secondary | ICD-10-CM | POA: Diagnosis not present

## 2022-06-15 DIAGNOSIS — C220 Liver cell carcinoma: Secondary | ICD-10-CM | POA: Diagnosis not present

## 2022-06-15 DIAGNOSIS — K219 Gastro-esophageal reflux disease without esophagitis: Secondary | ICD-10-CM | POA: Diagnosis not present

## 2022-06-15 DIAGNOSIS — E46 Unspecified protein-calorie malnutrition: Secondary | ICD-10-CM | POA: Diagnosis not present

## 2022-06-15 DIAGNOSIS — R4182 Altered mental status, unspecified: Secondary | ICD-10-CM | POA: Diagnosis not present

## 2022-06-24 DEATH — deceased

## 2022-07-01 ENCOUNTER — Encounter (INDEPENDENT_AMBULATORY_CARE_PROVIDER_SITE_OTHER): Payer: 59 | Admitting: Ophthalmology

## 2022-07-16 ENCOUNTER — Other Ambulatory Visit (HOSPITAL_COMMUNITY): Payer: Medicare Other

## 2022-07-16 ENCOUNTER — Encounter (HOSPITAL_COMMUNITY): Payer: Self-pay

## 2022-08-06 ENCOUNTER — Other Ambulatory Visit: Payer: Medicare Other
